# Patient Record
Sex: Male | Born: 1950
Health system: Southern US, Community
[De-identification: ages and names within clinical notes are randomized; demographics above are authoritative.]

## PROBLEM LIST (undated history)

## (undated) DIAGNOSIS — I82403 Acute embolism and thrombosis of unspecified deep veins of lower extremity, bilateral: Secondary | ICD-10-CM

## (undated) DIAGNOSIS — G8929 Other chronic pain: Secondary | ICD-10-CM

## (undated) DIAGNOSIS — N1831 Chronic kidney disease, stage 3a: Secondary | ICD-10-CM

## (undated) DIAGNOSIS — R778 Other specified abnormalities of plasma proteins: Secondary | ICD-10-CM

## (undated) DIAGNOSIS — D62 Acute posthemorrhagic anemia: Secondary | ICD-10-CM

## (undated) DIAGNOSIS — R7309 Other abnormal glucose: Secondary | ICD-10-CM

## (undated) DIAGNOSIS — Z79899 Other long term (current) drug therapy: Secondary | ICD-10-CM

## (undated) DIAGNOSIS — Z5181 Encounter for therapeutic drug level monitoring: Secondary | ICD-10-CM

## (undated) DIAGNOSIS — I255 Ischemic cardiomyopathy: Secondary | ICD-10-CM

## (undated) DIAGNOSIS — I639 Cerebral infarction, unspecified: Secondary | ICD-10-CM

## (undated) DIAGNOSIS — I509 Heart failure, unspecified: Secondary | ICD-10-CM

## (undated) DIAGNOSIS — I4891 Unspecified atrial fibrillation: Secondary | ICD-10-CM

## (undated) DIAGNOSIS — J9601 Acute respiratory failure with hypoxia: Secondary | ICD-10-CM

## (undated) DIAGNOSIS — I69391 Dysphagia following cerebral infarction: Secondary | ICD-10-CM

## (undated) DIAGNOSIS — I5043 Acute on chronic combined systolic (congestive) and diastolic (congestive) heart failure: Secondary | ICD-10-CM

## (undated) DIAGNOSIS — N179 Acute kidney failure, unspecified: Secondary | ICD-10-CM

## (undated) DIAGNOSIS — I480 Paroxysmal atrial fibrillation: Secondary | ICD-10-CM

## (undated) DIAGNOSIS — I420 Dilated cardiomyopathy: Secondary | ICD-10-CM

## (undated) DIAGNOSIS — I10 Essential (primary) hypertension: Secondary | ICD-10-CM

## (undated) DIAGNOSIS — B1920 Unspecified viral hepatitis C without hepatic coma: Secondary | ICD-10-CM

## (undated) DIAGNOSIS — R7989 Other specified abnormal findings of blood chemistry: Secondary | ICD-10-CM

## (undated) HISTORY — DX: Acute on chronic combined systolic (congestive) and diastolic (congestive) heart failure: I50.43

## (undated) HISTORY — DX: Ischemic cardiomyopathy: I25.5

## (undated) HISTORY — DX: Cerebral infarction, unspecified: I63.9

## (undated) HISTORY — DX: Other chronic pain: G89.29

## (undated) HISTORY — DX: Acute kidney failure, unspecified: N17.9

## (undated) HISTORY — DX: Acute embolism and thrombosis of unspecified deep veins of lower extremity, bilateral: I82.403

## (undated) HISTORY — DX: Paroxysmal atrial fibrillation: I48.0

## (undated) HISTORY — DX: Other abnormal glucose: R73.09

## (undated) HISTORY — DX: Unspecified atrial fibrillation: I48.91

## (undated) HISTORY — DX: Other specified abnormal findings of blood chemistry: R79.89

## (undated) HISTORY — DX: Dilated cardiomyopathy: I42.0

## (undated) HISTORY — DX: Acute respiratory failure with hypoxia: J96.01

## (undated) HISTORY — DX: Chronic kidney disease, stage 3a: N18.31

## (undated) HISTORY — DX: Acute posthemorrhagic anemia: D62

## (undated) HISTORY — DX: Other specified abnormalities of plasma proteins: R77.8

## (undated) HISTORY — DX: Dysphagia following cerebral infarction: I69.391

## (undated) NOTE — *Deleted (*Deleted)
Initial Nutrition Assessment  DOCUMENTATION CODES:      INTERVENTION:  ***   NUTRITION DIAGNOSIS:     related to   as evidenced by  .  ***  GOAL:      ***  MONITOR:      REASON FOR ASSESSMENT:        ASSESSMENT:      ***   NUTRITION - FOCUSED PHYSICAL EXAM:  {RD Focused Exam List:21252}  Diet Order:   Diet Order            Diet heart healthy/carb modified Room service appropriate? Yes; Fluid consistency: Thin; Fluid restriction: 1800 mL Fluid  Diet effective now                 EDUCATION NEEDS:      Skin:     Last BM:     Height:   Ht Readings from Last 1 Encounters:  12/26/19 5\' 7"  (1.702 m)    Weight:   Wt Readings from Last 1 Encounters:  12/30/19 77.6 kg    Ideal Body Weight:     BMI:  Body mass index is 26.79 kg/m.  Estimated Nutritional Needs:   Kcal:     Protein:     Fluid:       ***

---

## 1988-02-20 HISTORY — PX: EYE SURGERY: SHX253

## 2013-02-19 HISTORY — PX: CARDIAC DEFIBRILLATOR PLACEMENT: SHX171

## 2016-08-14 ENCOUNTER — Ambulatory Visit (INDEPENDENT_AMBULATORY_CARE_PROVIDER_SITE_OTHER): Payer: Medicaid Other | Admitting: Family Medicine

## 2016-08-14 ENCOUNTER — Encounter: Payer: Self-pay | Admitting: Family Medicine

## 2016-08-14 DIAGNOSIS — I10 Essential (primary) hypertension: Secondary | ICD-10-CM

## 2016-08-14 DIAGNOSIS — B1920 Unspecified viral hepatitis C without hepatic coma: Secondary | ICD-10-CM | POA: Insufficient documentation

## 2016-08-14 DIAGNOSIS — B182 Chronic viral hepatitis C: Secondary | ICD-10-CM

## 2016-08-14 DIAGNOSIS — I509 Heart failure, unspecified: Secondary | ICD-10-CM | POA: Insufficient documentation

## 2016-08-14 HISTORY — DX: Essential (primary) hypertension: I10

## 2016-08-14 MED ORDER — FUROSEMIDE 20 MG PO TABS: 20.0000 mg | ORAL_TABLET | Freq: Every day | ORAL | 3 refills | Status: DC

## 2016-08-14 MED ORDER — CARVEDILOL 25 MG PO TABS: 37.5000 mg | ORAL_TABLET | Freq: Two times a day (BID) | ORAL | 1 refills | Status: DC

## 2016-08-14 NOTE — Assessment & Plan Note (Signed)
Unclear of status Requesting records from last PCP Consider labs and referral to GI/ID at f/u in 1 month

## 2016-08-14 NOTE — Progress Notes (Signed)
   Subjective:   Glenn [name] is a 66 y.o. male with a history of CHF, HTN, hepatitis C here for establishing care  SOB - woke him at 3am this morning - now resolved - had something similar when he had afib previously - denies CP - took double dose of Lasix this AM as was instructed by previous cardiologist for when he was SOB - Never told that he has COPD, but taking Advair, but only as needed  Was being treated for heart failure by Cardiologist in [place] - thinks Echo was done around 02/2016 - doesn't know his EF - Cardiologist: Gerre Couch MD  Hep C - thinks that he started treatment in [place] but did not see GI  On disability for neck and shoulder pain  Review of Systems:  Per HPI.   Social History: current smoker - pre-contemplative  Objective:  BP 124/82   Pulse 68   Temp 98.2 F (36.8 C) (Oral)   Wt 183 lb (83 kg)   SpO2 98%   Gen:  66 y.o. male in NAD HEENT: NCAT, MMM, anicteric sclerae Neck: Supple, no LAD CV: RRR, no MRG Resp: Non-labored, slight crackles in b/l bases Abd: Soft, NTND, BS present, no guarding or organomegaly Ext: WWP, 1+ edema b/l MSK: No obvious deformities, gait intact Neuro: Alert and oriented, speech normal      Assessment & Plan:     Glenn [name] is a 66 y.o. male here for   HTN (hypertension) Well controlled today Continue current meds Would draw BMP at next visit in 1 month Will request records from former cardiologist and PCP  CHF (congestive heart failure) ([place]) Unclear of last EF, but does have AICD in place so suspect quite reduced EF Requesting records from previous Cardiologist Continue current meds - is taking ASA, loop diuretic, ACEi, B blocker, and Hydral/Imdur Referral to Cardiology  Is slightly hypervolemic currently, so will increase LAsix to 40mg  daily x4 days and then resume 20mg  daily Check BMP at next visit Return precautions discussed  Hepatitis C Unclear of status Requesting records from last  PCP Consider labs and referral to GI/ID at f/u in 1 month   Glenn [name], Dionne Bucy, MD MPH PGY-3,  [facility] 08/14/2016  2:38 PM

## 2016-08-14 NOTE — Patient Instructions (Signed)
Referral was placed for cardiology.  Someone will call about this in 1-2 weeks.  Let us know if you do not hear back about this.  Make a follow-up appointment with our clinic in 2-4 weeks.  We will request records from your doctors in [place].  Take Lasix 40mg  daily (2 pills) for the next 3 days for the extra fluid and then go back to 1 pill daily.  Take care, Dr. Jacinto Reap

## 2016-08-14 NOTE — Assessment & Plan Note (Signed)
Well controlled today Continue current meds Would draw BMP at next visit in 1 month Will request records from former cardiologist and PCP

## 2016-08-14 NOTE — Assessment & Plan Note (Addendum)
Unclear of last EF, but does have AICD in place so suspect quite reduced EF Requesting records from previous Cardiologist Continue current meds - is taking ASA, loop diuretic, ACEi, B blocker, and Hydral/Imdur Referral to Cardiology  Is slightly hypervolemic currently, so will increase LAsix to 40mg  daily x4 days and then resume 20mg  daily Check BMP at next visit Return precautions discussed

## 2016-09-05 ENCOUNTER — Emergency Department (HOSPITAL_COMMUNITY): Payer: Medicaid Other | Admitting: Certified Registered"

## 2016-09-05 ENCOUNTER — Inpatient Hospital Stay (HOSPITAL_COMMUNITY): Payer: Medicaid Other

## 2016-09-05 ENCOUNTER — Emergency Department (HOSPITAL_COMMUNITY): Payer: Medicaid Other

## 2016-09-05 ENCOUNTER — Encounter (HOSPITAL_COMMUNITY)
Admission: EM | Disposition: A | Discharge status: Home / Self Care | Payer: Self-pay | Source: Home / Self Care | Attending: Neurology

## 2016-09-05 ENCOUNTER — Encounter (HOSPITAL_COMMUNITY): Payer: Self-pay | Admitting: Emergency Medicine

## 2016-09-05 ENCOUNTER — Inpatient Hospital Stay (HOSPITAL_COMMUNITY)
Admission: EM | Admit: 2016-09-05 | Discharge: 2016-09-08 | DRG: 023 | Disposition: A | Discharge status: Home / Self Care | Payer: Medicaid Other | Attending: Neurology | Admitting: Neurology

## 2016-09-05 DIAGNOSIS — R29716 NIHSS score 16: Secondary | ICD-10-CM | POA: Diagnosis present

## 2016-09-05 DIAGNOSIS — R4701 Aphasia: Secondary | ICD-10-CM | POA: Diagnosis present

## 2016-09-05 DIAGNOSIS — F17200 Nicotine dependence, unspecified, uncomplicated: Secondary | ICD-10-CM | POA: Diagnosis not present

## 2016-09-05 DIAGNOSIS — R531 Weakness: Secondary | ICD-10-CM | POA: Diagnosis present

## 2016-09-05 DIAGNOSIS — I255 Ischemic cardiomyopathy: Secondary | ICD-10-CM | POA: Diagnosis present

## 2016-09-05 DIAGNOSIS — T4275XA Adverse effect of unspecified antiepileptic and sedative-hypnotic drugs, initial encounter: Secondary | ICD-10-CM | POA: Diagnosis present

## 2016-09-05 DIAGNOSIS — I63412 Cerebral infarction due to embolism of left middle cerebral artery: Secondary | ICD-10-CM | POA: Diagnosis not present

## 2016-09-05 DIAGNOSIS — Z7901 Long term (current) use of anticoagulants: Secondary | ICD-10-CM

## 2016-09-05 DIAGNOSIS — I110 Hypertensive heart disease with heart failure: Secondary | ICD-10-CM | POA: Diagnosis present

## 2016-09-05 DIAGNOSIS — I63312 Cerebral infarction due to thrombosis of left middle cerebral artery: Secondary | ICD-10-CM | POA: Diagnosis not present

## 2016-09-05 DIAGNOSIS — G9340 Encephalopathy, unspecified: Secondary | ICD-10-CM | POA: Diagnosis present

## 2016-09-05 DIAGNOSIS — G8191 Hemiplegia, unspecified affecting right dominant side: Secondary | ICD-10-CM | POA: Diagnosis present

## 2016-09-05 DIAGNOSIS — Z79899 Other long term (current) drug therapy: Secondary | ICD-10-CM

## 2016-09-05 DIAGNOSIS — D649 Anemia, unspecified: Secondary | ICD-10-CM | POA: Diagnosis not present

## 2016-09-05 DIAGNOSIS — I10 Essential (primary) hypertension: Secondary | ICD-10-CM | POA: Diagnosis not present

## 2016-09-05 DIAGNOSIS — I482 Chronic atrial fibrillation: Secondary | ICD-10-CM | POA: Diagnosis not present

## 2016-09-05 DIAGNOSIS — N179 Acute kidney failure, unspecified: Secondary | ICD-10-CM | POA: Diagnosis present

## 2016-09-05 DIAGNOSIS — I481 Persistent atrial fibrillation: Secondary | ICD-10-CM | POA: Diagnosis present

## 2016-09-05 DIAGNOSIS — I5043 Acute on chronic combined systolic (congestive) and diastolic (congestive) heart failure: Secondary | ICD-10-CM | POA: Diagnosis present

## 2016-09-05 DIAGNOSIS — F17210 Nicotine dependence, cigarettes, uncomplicated: Secondary | ICD-10-CM | POA: Diagnosis present

## 2016-09-05 DIAGNOSIS — I639 Cerebral infarction, unspecified: Secondary | ICD-10-CM

## 2016-09-05 DIAGNOSIS — I63512 Cerebral infarction due to unspecified occlusion or stenosis of left middle cerebral artery: Secondary | ICD-10-CM | POA: Diagnosis present

## 2016-09-05 DIAGNOSIS — Q251 Coarctation of aorta: Secondary | ICD-10-CM

## 2016-09-05 DIAGNOSIS — I4819 Other persistent atrial fibrillation: Secondary | ICD-10-CM

## 2016-09-05 DIAGNOSIS — Z95810 Presence of automatic (implantable) cardiac defibrillator: Secondary | ICD-10-CM | POA: Diagnosis not present

## 2016-09-05 DIAGNOSIS — J9601 Acute respiratory failure with hypoxia: Secondary | ICD-10-CM | POA: Diagnosis present

## 2016-09-05 DIAGNOSIS — I509 Heart failure, unspecified: Secondary | ICD-10-CM | POA: Diagnosis not present

## 2016-09-05 DIAGNOSIS — Z7982 Long term (current) use of aspirin: Secondary | ICD-10-CM

## 2016-09-05 DIAGNOSIS — B1920 Unspecified viral hepatitis C without hepatic coma: Secondary | ICD-10-CM | POA: Diagnosis present

## 2016-09-05 DIAGNOSIS — I360 Nonrheumatic tricuspid (valve) stenosis: Secondary | ICD-10-CM | POA: Diagnosis not present

## 2016-09-05 DIAGNOSIS — R402412 Glasgow coma scale score 13-15, at arrival to emergency department: Secondary | ICD-10-CM | POA: Diagnosis present

## 2016-09-05 DIAGNOSIS — R911 Solitary pulmonary nodule: Secondary | ICD-10-CM | POA: Diagnosis present

## 2016-09-05 DIAGNOSIS — I4891 Unspecified atrial fibrillation: Secondary | ICD-10-CM | POA: Diagnosis not present

## 2016-09-05 DIAGNOSIS — I5022 Chronic systolic (congestive) heart failure: Secondary | ICD-10-CM | POA: Diagnosis not present

## 2016-09-05 DIAGNOSIS — G92 Toxic encephalopathy: Secondary | ICD-10-CM | POA: Diagnosis present

## 2016-09-05 DIAGNOSIS — I6300 Cerebral infarction due to thrombosis of unspecified precerebral artery: Secondary | ICD-10-CM

## 2016-09-05 HISTORY — DX: Heart failure, unspecified: I50.9

## 2016-09-05 HISTORY — DX: Unspecified viral hepatitis C without hepatic coma: B19.20

## 2016-09-05 HISTORY — DX: Essential (primary) hypertension: I10

## 2016-09-05 HISTORY — DX: Cerebral infarction, unspecified: I63.9

## 2016-09-05 HISTORY — PX: RADIOLOGY WITH ANESTHESIA: SHX6223

## 2016-09-05 HISTORY — PX: IR PERCUTANEOUS ART THROMBECTOMY/INFUSION INTRACRANIAL INC DIAG ANGIO: IMG6087

## 2016-09-05 LAB — COMPREHENSIVE METABOLIC PANEL
ALBUMIN: 4 g/dL (ref 3.5–5.0)
ALK PHOS: 72 U/L (ref 38–126)
ALT: 33 U/L (ref 17–63)
ANION GAP: 8 (ref 5–15)
AST: 43 U/L — ABNORMAL HIGH (ref 15–41)
BUN: 15 mg/dL (ref 6–20)
CO2: 23 mmol/L (ref 22–32)
Calcium: 9.1 mg/dL (ref 8.9–10.3)
Chloride: 104 mmol/L (ref 101–111)
Creatinine, Ser: 1.31 mg/dL — ABNORMAL HIGH (ref 0.61–1.24)
GFR calc non Af Amer: >60 mL/min (ref >60)
GLUCOSE: 134 mg/dL — AB (ref 65–99)
POTASSIUM: 4.1 mmol/L (ref 3.5–5.1)
SODIUM: 135 mmol/L (ref 135–145)
TOTAL PROTEIN: 7.1 g/dL (ref 6.5–8.1)
Total Bilirubin: 0.9 mg/dL (ref 0.3–1.2)

## 2016-09-05 LAB — I-STAT CHEM 8, ED
BUN: 19 mg/dL (ref 6–20)
CALCIUM ION: 1.07 mmol/L — AB (ref 1.15–1.40)
Chloride: 103 mmol/L (ref 101–111)
Creatinine, Ser: 1.3 mg/dL — ABNORMAL HIGH (ref 0.61–1.24)
Glucose, Bld: 139 mg/dL — ABNORMAL HIGH (ref 65–99)
HEMATOCRIT: 38 % — AB (ref 39.0–52.0)
HEMOGLOBIN: 12.9 g/dL — AB (ref 13.0–17.0)
Potassium: 4.4 mmol/L (ref 3.5–5.1)
SODIUM: 139 mmol/L (ref 135–145)
TCO2: 26 mmol/L (ref 0–100)

## 2016-09-05 LAB — CBC
HCT: 34.3 % — ABNORMAL LOW (ref 39.0–52.0)
Hemoglobin: 11.7 g/dL — ABNORMAL LOW (ref 13.0–17.0)
MCH: 32.8 pg (ref 26.0–34.0)
MCHC: 34.1 g/dL (ref 30.0–36.0)
MCV: 96.1 fL (ref 78.0–100.0)
PLATELETS: 188 10*3/uL (ref 150–400)
RBC: 3.57 MIL/uL — AB (ref 4.22–5.81)
RDW: 12.4 % (ref 11.5–15.5)
WBC: 5 10*3/uL (ref 4.0–10.5)

## 2016-09-05 LAB — PROTIME-INR
INR: 1.3
PROTHROMBIN TIME: 16.3 s — AB (ref 11.4–15.2)

## 2016-09-05 LAB — DIFFERENTIAL
BASOS PCT: 0 %
Basophils Absolute: 0 10*3/uL (ref 0.0–0.1)
EOS ABS: 0.8 10*3/uL — AB (ref 0.0–0.7)
Eosinophils Relative: 17 %
Lymphocytes Relative: 42 %
Lymphs Abs: 2.1 10*3/uL (ref 0.7–4.0)
MONO ABS: 0.5 10*3/uL (ref 0.1–1.0)
Monocytes Relative: 10 %
NEUTROS PCT: 31 %
Neutro Abs: 1.5 10*3/uL — ABNORMAL LOW (ref 1.7–7.7)

## 2016-09-05 LAB — I-STAT TROPONIN, ED: Troponin i, poc: 0.01 ng/mL (ref 0.00–0.08)

## 2016-09-05 LAB — APTT: aPTT: 33 seconds (ref 24–36)

## 2016-09-05 LAB — ETHANOL: ALCOHOL ETHYL (B): 8 mg/dL — AB (ref <5)

## 2016-09-05 SURGERY — RADIOLOGY WITH ANESTHESIA
Anesthesia: General

## 2016-09-05 MED ORDER — EPTIFIBATIDE 20 MG/10ML IV SOLN
INTRAVENOUS | Status: AC
  Filled 2016-09-05: qty 10

## 2016-09-05 MED ORDER — ACETAMINOPHEN 325 MG PO TABS: 650.0000 mg | ORAL_TABLET | ORAL | Status: DC | PRN

## 2016-09-05 MED ORDER — BUSPIRONE HCL 15 MG PO TABS: 7.5000 mg | ORAL_TABLET | Freq: Every day | ORAL | Status: DC

## 2016-09-05 MED ORDER — PHENYLEPHRINE HCL 10 MG/ML IJ SOLN
INTRAVENOUS | Status: DC | PRN
  Administered 2016-09-05: 30 ug/min via INTRAVENOUS

## 2016-09-05 MED ORDER — ACETAMINOPHEN 650 MG RE SUPP
650.0000 mg | RECTAL | Status: DC | PRN
  Administered 2016-09-06: 650 mg via RECTAL
  Filled 2016-09-05: qty 1

## 2016-09-05 MED ORDER — SUCCINYLCHOLINE CHLORIDE 20 MG/ML IJ SOLN
INTRAMUSCULAR | Status: DC | PRN
  Administered 2016-09-05: 140 mg via INTRAVENOUS

## 2016-09-05 MED ORDER — PROPOFOL 500 MG/50ML IV EMUL
INTRAVENOUS | Status: DC | PRN
  Administered 2016-09-05: 75 ug/kg/min via INTRAVENOUS

## 2016-09-05 MED ORDER — IOPAMIDOL (ISOVUE-300) INJECTION 61%
INTRAVENOUS | Status: AC
Start: 2016-09-05 — End: 2016-09-05
  Administered 2016-09-05: 96 mL
  Filled 2016-09-05: qty 300

## 2016-09-05 MED ORDER — PANTOPRAZOLE SODIUM 40 MG PO TBEC
40.0000 mg | DELAYED_RELEASE_TABLET | Freq: Every day | ORAL | Status: DC
  Administered 2016-09-07 – 2016-09-08 (×2): 40 mg via ORAL
  Filled 2016-09-05 (×2): qty 1

## 2016-09-05 MED ORDER — IOPAMIDOL (ISOVUE-370) INJECTION 76%
100.0000 mL | Freq: Once | INTRAVENOUS | Status: AC | PRN
  Administered 2016-09-05: 100 mL via INTRAVENOUS

## 2016-09-05 MED ORDER — ROCURONIUM BROMIDE 100 MG/10ML IV SOLN
INTRAVENOUS | Status: DC | PRN
  Administered 2016-09-05 (×2): 50 mg via INTRAVENOUS

## 2016-09-05 MED ORDER — SODIUM CHLORIDE 0.9 % IV SOLN
INTRAVENOUS | Status: DC | PRN
  Administered 2016-09-05 (×2): via INTRAVENOUS

## 2016-09-05 MED ORDER — HYDRALAZINE HCL 50 MG PO TABS: 25.0000 mg | ORAL_TABLET | Freq: Two times a day (BID) | ORAL | Status: DC

## 2016-09-05 MED ORDER — ACETAMINOPHEN 160 MG/5ML PO SOLN: 650.0000 mg | ORAL | Status: DC | PRN

## 2016-09-05 MED ORDER — CARVEDILOL 12.5 MG PO TABS: 37.5000 mg | ORAL_TABLET | Freq: Two times a day (BID) | ORAL | Status: DC

## 2016-09-05 MED ORDER — SENNOSIDES-DOCUSATE SODIUM 8.6-50 MG PO TABS
1.0000 | ORAL_TABLET | Freq: Every evening | ORAL | Status: DC | PRN
  Administered 2016-09-07: 1 via ORAL
  Filled 2016-09-05: qty 1

## 2016-09-05 MED ORDER — VITAMIN B-1 100 MG PO TABS
100.0000 mg | ORAL_TABLET | Freq: Every day | ORAL | Status: DC
  Administered 2016-09-06 – 2016-09-08 (×3): 100 mg via ORAL
  Filled 2016-09-05 (×3): qty 1

## 2016-09-05 MED ORDER — CEFAZOLIN SODIUM-DEXTROSE 2-3 GM-% IV SOLR
INTRAVENOUS | Status: DC | PRN
  Administered 2016-09-05: 2 g via INTRAVENOUS

## 2016-09-05 MED ORDER — NITROGLYCERIN 1 MG/10 ML FOR IR/CATH LAB
INTRA_ARTERIAL | Status: AC
  Administered 2016-09-05: 25 ug
  Administered 2016-09-05: 50 ug
  Administered 2016-09-05: 25 ug
  Filled 2016-09-05: qty 10

## 2016-09-05 MED ORDER — SODIUM CHLORIDE 0.9 % IV SOLN
INTRAVENOUS | Status: DC
  Administered 2016-09-06: 01:00:00 via INTRAVENOUS

## 2016-09-05 MED ORDER — PROPOFOL 10 MG/ML IV BOLUS
INTRAVENOUS | Status: DC | PRN
  Administered 2016-09-05: 150 mg via INTRAVENOUS

## 2016-09-05 MED ORDER — EPHEDRINE SULFATE 50 MG/ML IJ SOLN
INTRAMUSCULAR | Status: DC | PRN
  Administered 2016-09-05 (×2): 10 mg via INTRAVENOUS

## 2016-09-05 MED ORDER — CEFAZOLIN SODIUM-DEXTROSE 2-4 GM/100ML-% IV SOLN
INTRAVENOUS | Status: AC
  Filled 2016-09-05: qty 100

## 2016-09-05 MED ORDER — STROKE: EARLY STAGES OF RECOVERY BOOK
Freq: Once | Status: AC
  Filled 2016-09-05: qty 1

## 2016-09-05 MED ORDER — MOMETASONE FURO-FORMOTEROL FUM 200-5 MCG/ACT IN AERO: 2.0000 | INHALATION_SPRAY | Freq: Two times a day (BID) | RESPIRATORY_TRACT | Status: DC

## 2016-09-05 MED ORDER — PHENYLEPHRINE HCL 10 MG/ML IJ SOLN
INTRAMUSCULAR | Status: DC | PRN
  Administered 2016-09-05: 40 ug via INTRAVENOUS
  Administered 2016-09-05 (×3): 80 ug via INTRAVENOUS

## 2016-09-05 MED ORDER — LISINOPRIL 10 MG PO TABS: 5.0000 mg | ORAL_TABLET | Freq: Every day | ORAL | Status: DC

## 2016-09-05 MED ORDER — FUROSEMIDE 20 MG PO TABS: 20.0000 mg | ORAL_TABLET | Freq: Every day | ORAL | Status: DC

## 2016-09-05 MED ORDER — VITAMIN D 1000 UNITS PO TABS
1000.0000 [IU] | ORAL_TABLET | Freq: Every day | ORAL | Status: DC
Start: 2016-09-06 — End: 2016-09-08
  Administered 2016-09-06 – 2016-09-08 (×3): 1000 [IU] via ORAL
  Filled 2016-09-05 (×3): qty 1

## 2016-09-05 NOTE — Consult Note (Signed)
PULMONARY / CRITICAL CARE MEDICINE   Name: Glenn [name] MRN: [id number] DOB: 03/10/1960    ADMISSION DATE:  09/05/2016 CONSULTATION DATE:  09/05/16  REFERRING MD:  Cheral Marker  CHIEF COMPLAINT:  AMS  HISTORY OF PRESENT ILLNESS:  Pt is encephelopathic; therefore, this HPI is obtained from chart review. Glenn [name] is a 66 y.o. male with PMH as outlined below. He presented to [facility] ED 7/18 as code stroke.  On arrival to ED, he was mute with right hemiparesis and right facial droop.  CT demonstrated hyperdense left M2 segment and CTA demonstrated left MCA occlusion.  He was subsequently taken to IR where he had left common carotid arteriogram followed by complete revascularization of occluded left MCA.  He then returned to the ICU on the ventilator and PCCM was asked to assist with vent management.  PAST MEDICAL HISTORY :  He  has a past medical history of CHF (congestive heart failure) ([place]); Hepatitis C; and Hypertension.  PAST SURGICAL HISTORY: He  has a past surgical history that includes Eye surgery (Left, 1990) and Cardiac defibrillator placement (2015).  No Known Allergies  No current facility-administered medications on file prior to encounter.    Current Outpatient Prescriptions on File Prior to Encounter  Medication Sig  . aspirin EC 81 MG tablet Take 81 mg by mouth daily.  . busPIRone (BUSPAR) 7.5 MG tablet Take 7.5 mg by mouth daily.  . carvedilol (COREG) 25 MG tablet Take 1.5 tablets (37.5 mg total) by mouth 2 (two) times daily with a meal.  . Cholecalciferol (VITAMIN D3) 1000 units CAPS Take 1,000 Units by mouth daily.  . Fluticasone-Salmeterol (ADVAIR) 250-50 MCG/DOSE AEPB Inhale 1 puff into the lungs 2 (two) times daily.  . furosemide (LASIX) 20 MG tablet Take 1 tablet (20 mg total) by mouth daily.  . hydrALAZINE (APRESOLINE) 25 MG tablet Take 25 mg by mouth 2 (two) times daily.  . isosorbide mononitrate (ISMO,MONOKET) 10 MG tablet Take 10 mg by mouth daily.  Marland Kitchen  lisinopril (PRINIVIL,ZESTRIL) 5 MG tablet Take 5 mg by mouth daily.  Marland Kitchen omeprazole (PRILOSEC) 20 MG capsule Take 20 mg by mouth daily.  . rivaroxaban (XARELTO) 20 MG TABS tablet Take 20 mg by mouth daily with supper.  . thiamine (VITAMIN B-1) 100 MG tablet Take 100 mg by mouth daily.    FAMILY HISTORY:  His indicated that his mother is deceased.    SOCIAL HISTORY: He  reports that he has been smoking Cigarettes.  He has been smoking about 0.50 packs per day. He has never used smokeless tobacco. He reports that he drinks about 1.8 oz of alcohol per week . He reports that he uses drugs, including Marijuana, about 2 times per week.  REVIEW OF SYSTEMS:   Unable to obtain as pt is encephalopathic.  SUBJECTIVE:  On vent, unresponsive.  VITAL SIGNS: BP 110/67   Pulse (!) 107   Temp 97.6 F (36.4 C) (Axillary)   Resp 18   SpO2 100%   HEMODYNAMICS:    VENTILATOR SETTINGS:    INTAKE / OUTPUT: No intake/output data recorded.   PHYSICAL EXAMINATION: General: Adult male, in NAD. Neuro: Sedated, non-responsive.  HEENT: [place]/AT. PERRL, sclerae anicteric. Cardiovascular: IRIR, no M/R/G.  Lungs: Respirations even and unlabored.  CTA bilaterally, No W/R/R. Abdomen: BS x 4, soft, NT/ND.  Musculoskeletal: No gross deformities, no edema.  Skin: Intact, warm, no rashes.  LABS:  BMET  Recent Labs Lab 09/05/16 2020 09/05/16 2028  NA 135 139  K 4.1  4.4  CL 104 103  CO2 23  --   BUN 15 19  CREATININE 1.31* 1.30*  GLUCOSE 134* 139*    Electrolytes  Recent Labs Lab 09/05/16 2020  CALCIUM 9.1    CBC  Recent Labs Lab 09/05/16 2020 09/05/16 2028  WBC 5.0  --   HGB 11.7* 12.9*  HCT 34.3* 38.0*  PLT 188  --     Coag's  Recent Labs Lab 09/05/16 2020  APTT 33  INR 1.30    Sepsis Markers No results for input(s): LATICACIDVEN, PROCALCITON, O2SATVEN in the last 168 hours.  ABG No results for input(s): PHART, PCO2ART, PO2ART in the last 168 hours.  Liver  Enzymes  Recent Labs Lab 09/05/16 2020  AST 43*  ALT 33  ALKPHOS 72  BILITOT 0.9  ALBUMIN 4.0    Cardiac Enzymes No results for input(s): TROPONINI, PROBNP in the last 168 hours.  Glucose No results for input(s): GLUCAP in the last 168 hours.  Imaging Ct Angio Head W Or Wo Contrast  Result Date: 09/05/2016 EXAM: CT ANGIOGRAPHY HEAD AND NECK CT PERFUSION BRAIN TECHNIQUE: Multidetector CT imaging of the head and neck was performed using the standard protocol during bolus administration of intravenous contrast. Multiplanar CT image reconstructions and MIPs were obtained to evaluate the vascular anatomy. Carotid stenosis measurements (when applicable) are obtained utilizing NASCET criteria, using the distal internal carotid diameter as the denominator. Multiphase CT imaging of the brain was performed following IV bolus contrast injection. Subsequent parametric perfusion maps were calculated using RAPID software. COMPARISON:  None. FINDINGS: CTA NECK FINDINGS Aortic arch: Bovine arch. Imaged portion shows no evidence of aneurysm or dissection. No significant stenosis of the major arch vessel origins. Minimal calcific atherosclerosis of the aortic arch. Right carotid system: No evidence of dissection, stenosis (50% or greater) or occlusion. Moderate calcified plaque of the carotid bifurcation with minimal less than 30% proximal ICA stenosis. Left carotid system: No evidence of dissection, stenosis (50% or greater) or occlusion. Mild calcified plaque of the bifurcation. Vertebral arteries: Codominant. No evidence of dissection, stenosis (50% or greater) or occlusion. Skeleton: Moderate cervical degenerative changes with upper cervical facet arthrosis greater on the right and discogenic degenerative disease greatest at the C4 through C7 levels. No high-grade bony canal stenosis. Other neck: Negative. Upper chest: 5 mm nodule in the right upper lobe (series 8, image 347). Review of the MIP images  confirms the above findings CTA HEAD FINDINGS Anterior circulation: Left MCA superior division origin occlusion with intermediate collateralization. The point of occlusion is in the left anterior sylvian fissure corresponding to the hyperdensity on prior noncontrast CT of the head (series 10, image 147) and at the branching origin of the inferior division which makes an approximately 90 degrees angle downward turn relative to the M1 (series 13, image 31). Otherwise the anterior circulation is patent without high-grade stenosis, aneurysm, or occlusion. Calcified plaque of the carotid siphons with mild right-sided distal cavernous stenosis. Posterior circulation: No significant stenosis, proximal occlusion, aneurysm, or vascular malformation. Venous sinuses: As permitted by contrast timing, patent. Anatomic variants: Right larger than left posterior communicating artery is and diminutive patent anterior communicating artery. Review of the MIP images confirms the above findings CT Brain Perfusion Findings: CBF (<30%) Volume: 13mL Perfusion (Tmax>6.0s) volume: 174mL Mismatch Volume: 137mL Infarction Location:Perfusion anomaly is centered in the left lateral frontal and parietal lobes. IMPRESSION: CT perfusion: No infarct core (CBF less than 30%) by automated RAPID processing. At risk ischemia in the left  superior MCA distribution with volume 108 cc. CTA head: 1. Left MCA superior division origin occlusion with intermediate collateralization. 2. Otherwise patent circle of Willis without significant stenosis or aneurysm identified. CTA neck: 1. Patent carotid and vertebral arteries. No dissection, aneurysm, or hemodynamically significant stenosis is identified. 2. 5 mm right upper lobe pulmonary nodule. No follow-up needed if patient is low-risk. Non-contrast chest CT can be considered in 12 months if patient is high-risk. This recommendation follows the consensus statement: Guidelines for Management of Incidental  Pulmonary Nodules Detected on CT Images: From the Fleischner Society 2017; Radiology 2017; 284:228-243. These results were called by telephone at the time of interpretation on 09/05/2016 at 9:09 pm to Dr. Cheral Marker, who verbally acknowledged these results. Electronically Signed   By: Kristine Garbe M.D.   On: 09/05/2016 21:26   Ct Angio Neck W And/or Wo Contrast  Result Date: 09/05/2016 EXAM: CT ANGIOGRAPHY HEAD AND NECK CT PERFUSION BRAIN TECHNIQUE: Multidetector CT imaging of the head and neck was performed using the standard protocol during bolus administration of intravenous contrast. Multiplanar CT image reconstructions and MIPs were obtained to evaluate the vascular anatomy. Carotid stenosis measurements (when applicable) are obtained utilizing NASCET criteria, using the distal internal carotid diameter as the denominator. Multiphase CT imaging of the brain was performed following IV bolus contrast injection. Subsequent parametric perfusion maps were calculated using RAPID software. COMPARISON:  None. FINDINGS: CTA NECK FINDINGS Aortic arch: Bovine arch. Imaged portion shows no evidence of aneurysm or dissection. No significant stenosis of the major arch vessel origins. Minimal calcific atherosclerosis of the aortic arch. Right carotid system: No evidence of dissection, stenosis (50% or greater) or occlusion. Moderate calcified plaque of the carotid bifurcation with minimal less than 30% proximal ICA stenosis. Left carotid system: No evidence of dissection, stenosis (50% or greater) or occlusion. Mild calcified plaque of the bifurcation. Vertebral arteries: Codominant. No evidence of dissection, stenosis (50% or greater) or occlusion. Skeleton: Moderate cervical degenerative changes with upper cervical facet arthrosis greater on the right and discogenic degenerative disease greatest at the C4 through C7 levels. No high-grade bony canal stenosis. Other neck: Negative. Upper chest: 5 mm nodule in the  right upper lobe (series 8, image 347). Review of the MIP images confirms the above findings CTA HEAD FINDINGS Anterior circulation: Left MCA superior division origin occlusion with intermediate collateralization. The point of occlusion is in the left anterior sylvian fissure corresponding to the hyperdensity on prior noncontrast CT of the head (series 10, image 147) and at the branching origin of the inferior division which makes an approximately 90 degrees angle downward turn relative to the M1 (series 13, image 31). Otherwise the anterior circulation is patent without high-grade stenosis, aneurysm, or occlusion. Calcified plaque of the carotid siphons with mild right-sided distal cavernous stenosis. Posterior circulation: No significant stenosis, proximal occlusion, aneurysm, or vascular malformation. Venous sinuses: As permitted by contrast timing, patent. Anatomic variants: Right larger than left posterior communicating artery is and diminutive patent anterior communicating artery. Review of the MIP images confirms the above findings CT Brain Perfusion Findings: CBF (<30%) Volume: 93mL Perfusion (Tmax>6.0s) volume: 167mL Mismatch Volume: 149mL Infarction Location:Perfusion anomaly is centered in the left lateral frontal and parietal lobes. IMPRESSION: CT perfusion: No infarct core (CBF less than 30%) by automated RAPID processing. At risk ischemia in the left superior MCA distribution with volume 108 cc. CTA head: 1. Left MCA superior division origin occlusion with intermediate collateralization. 2. Otherwise patent circle of Willis without significant stenosis  or aneurysm identified. CTA neck: 1. Patent carotid and vertebral arteries. No dissection, aneurysm, or hemodynamically significant stenosis is identified. 2. 5 mm right upper lobe pulmonary nodule. No follow-up needed if patient is low-risk. Non-contrast chest CT can be considered in 12 months if patient is high-risk. This recommendation follows the  consensus statement: Guidelines for Management of Incidental Pulmonary Nodules Detected on CT Images: From the Fleischner Society 2017; Radiology 2017; 284:228-243. These results were called by telephone at the time of interpretation on 09/05/2016 at 9:09 pm to Dr. Cheral Marker, who verbally acknowledged these results. Electronically Signed   By: Kristine Garbe M.D.   On: 09/05/2016 21:26   Ct Cerebral Perfusion W Contrast  Result Date: 09/05/2016 EXAM: CT ANGIOGRAPHY HEAD AND NECK CT PERFUSION BRAIN TECHNIQUE: Multidetector CT imaging of the head and neck was performed using the standard protocol during bolus administration of intravenous contrast. Multiplanar CT image reconstructions and MIPs were obtained to evaluate the vascular anatomy. Carotid stenosis measurements (when applicable) are obtained utilizing NASCET criteria, using the distal internal carotid diameter as the denominator. Multiphase CT imaging of the brain was performed following IV bolus contrast injection. Subsequent parametric perfusion maps were calculated using RAPID software. COMPARISON:  None. FINDINGS: CTA NECK FINDINGS Aortic arch: Bovine arch. Imaged portion shows no evidence of aneurysm or dissection. No significant stenosis of the major arch vessel origins. Minimal calcific atherosclerosis of the aortic arch. Right carotid system: No evidence of dissection, stenosis (50% or greater) or occlusion. Moderate calcified plaque of the carotid bifurcation with minimal less than 30% proximal ICA stenosis. Left carotid system: No evidence of dissection, stenosis (50% or greater) or occlusion. Mild calcified plaque of the bifurcation. Vertebral arteries: Codominant. No evidence of dissection, stenosis (50% or greater) or occlusion. Skeleton: Moderate cervical degenerative changes with upper cervical facet arthrosis greater on the right and discogenic degenerative disease greatest at the C4 through C7 levels. No high-grade bony canal  stenosis. Other neck: Negative. Upper chest: 5 mm nodule in the right upper lobe (series 8, image 347). Review of the MIP images confirms the above findings CTA HEAD FINDINGS Anterior circulation: Left MCA superior division origin occlusion with intermediate collateralization. The point of occlusion is in the left anterior sylvian fissure corresponding to the hyperdensity on prior noncontrast CT of the head (series 10, image 147) and at the branching origin of the inferior division which makes an approximately 90 degrees angle downward turn relative to the M1 (series 13, image 31). Otherwise the anterior circulation is patent without high-grade stenosis, aneurysm, or occlusion. Calcified plaque of the carotid siphons with mild right-sided distal cavernous stenosis. Posterior circulation: No significant stenosis, proximal occlusion, aneurysm, or vascular malformation. Venous sinuses: As permitted by contrast timing, patent. Anatomic variants: Right larger than left posterior communicating artery is and diminutive patent anterior communicating artery. Review of the MIP images confirms the above findings CT Brain Perfusion Findings: CBF (<30%) Volume: 55mL Perfusion (Tmax>6.0s) volume: 146mL Mismatch Volume: 163mL Infarction Location:Perfusion anomaly is centered in the left lateral frontal and parietal lobes. IMPRESSION: CT perfusion: No infarct core (CBF less than 30%) by automated RAPID processing. At risk ischemia in the left superior MCA distribution with volume 108 cc. CTA head: 1. Left MCA superior division origin occlusion with intermediate collateralization. 2. Otherwise patent circle of Willis without significant stenosis or aneurysm identified. CTA neck: 1. Patent carotid and vertebral arteries. No dissection, aneurysm, or hemodynamically significant stenosis is identified. 2. 5 mm right upper lobe pulmonary nodule. No follow-up  needed if patient is low-risk. Non-contrast chest CT can be considered in 12  months if patient is high-risk. This recommendation follows the consensus statement: Guidelines for Management of Incidental Pulmonary Nodules Detected on CT Images: From the Fleischner Society 2017; Radiology 2017; 284:228-243. These results were called by telephone at the time of interpretation on 09/05/2016 at 9:09 pm to Dr. Cheral Marker, who verbally acknowledged these results. Electronically Signed   By: Kristine Garbe M.D.   On: 09/05/2016 21:26   Ct Head Code Stroke W/o Cm  Result Date: 09/05/2016 CLINICAL DATA:  Code stroke.  Aphasia EXAM: CT HEAD WITHOUT CONTRAST TECHNIQUE: Contiguous axial images were obtained from the base of the skull through the vertex without intravenous contrast. COMPARISON:  None. FINDINGS: Brain: Generalized atrophy. Negative for hydrocephalus. Negative for acute infarct. No intracranial hemorrhage or mass. Vascular: Short segment hyperdensity left M2 segment compatible with acute thrombus. No other hyperdense vessel. Atherosclerotic disease in the cavernous carotid bilaterally. Skull: Negative Sinuses/Orbits: Negative Other: None ASPECTS ([place] Stroke Program Early CT Score) - Ganglionic level infarction (caudate, lentiform nuclei, internal capsule, insula, M1-M3 cortex): 7 - Supraganglionic infarction (M4-M6 cortex): 3 Total score (0-10 with 10 being normal): 10 IMPRESSION: 1. Negative for acute infarct or hemorrhage. Hyperdense left M2 segment compatible with acute thrombosis. 2. ASPECTS is 10 These results were called by telephone at the time of interpretation on 09/05/2016 at 8:40 pm to Dr. Cheral Marker, who verbally acknowledged these results. Electronically Signed   By: Franchot Gallo M.D.   On: 09/05/2016 20:41     STUDIES:  CT head 7/18 > hyperdense left M2 segment. CTA head 7/18 > let MCA occlusion.  CULTURES: None.  ANTIBIOTICS: None.  SIGNIFICANT EVENTS: 7/18 > admit.  LINES/TUBES: ETT 7/18 >   DISCUSSION: 66 y.o. male admitted 7/18 with left MCA  occlusion.  Taken to IR for revascularization then returned to ICU on vent.  ASSESSMENT / PLAN:  NEUROLOGIC A:   Acute encephalopathy - due to sedation. Left MCA occlusion - s/p IR revascularization 7/18. P:   Sedation:  Propofol gtt / Fentanyl PRN. RASS goal: 0 to -1. Daily WUA. Neurology following. Stroke workup / management per neuro. Hold preadmission buspirone.  PULMONARY A: Respiratory insufficiency - due to inability to extubate post IR procedure. P:   Full vent support. Wean as able. VAP prevention measures. SBT in AM with hopeful extubation. Budesonide / Brovana in lieu of preadmission Advair (unclear why he takes, no documentation of lung disease). CXR in AM.  CARDIOVASCULAR A:  A.fib with RVR - amio ordered. Hx CHF (no echo on file), A.fib (on xarelto) P:  Monitor hemodynamics. Increase sedation - if no improvement then can start amio. Hold preadmission carvedilol, furosemide, hydralazine, lisinopril, rivaroxaban.  RENAL A:   AKI - unknown baseline. Hypocalcemia. P:   NS @ 50. 1g Ca gluconate. BMP in AM.  GASTROINTESTINAL A:   GI prophylaxis. Nutrition. Hx HCV. P:   SUP: Pantoprazole. NPO.  HEMATOLOGIC A:   VTE Prophylaxis. P:  SCD's. CBC in AM.  INFECTIOUS A:   No indication of infection. P:   Monitor clinically.  ENDOCRINE A:   No acute issues.   P:   No interventions required.    Family updated: None available.  Interdisciplinary Family Meeting v Palliative Care Meeting:  Due by: 09/12/16.  CC time: 30 min.   Montey Hora, [facility] Pager: [phone]  or [phone] 09/06/2016, 12:38  AM

## 2016-09-05 NOTE — ED Notes (Signed)
Awaiting response from [place] regarding whether pt is candidate for IR. Pedial pulses marked.

## 2016-09-05 NOTE — ED Notes (Signed)
Reported off to IR team

## 2016-09-05 NOTE — H&P (Addendum)
Admission H&P    Chief Complaint: Acute stroke  HPI: Glenn [name] is an 66 y.o. male who presents to the ED as a Code Stroke. He was last seen normal at 7:30 PM, then became suddenly mute with right upper extremity weakness and right facial droop. Deficits were noted by his son, who immediately called 911. At his baseline he is alert, oriented and fully independent.   On arrival to the ED he remained mute with right hemiparesis and prominent right facial droop, in addition to leftward gaze deviation and relative neglect of his right side.   CT head showed no hemorrhage or acute hypodensity. Hyperdense left M2 segment compatible with acute thrombosis was noted.   CTA of head revealed a left MCA superior division origin occlusion with intermediate collateralization.  CTA neck revealed patent carotid and vertebral arteries. No dissection, aneurysm, or hemodynamically significant stenosis is identified.  CT perfusion study showed no infarct core (CBF less than 30%) by automated RAPID processing. At risk ischemia in the left superior MCA distribution with volume 108 cc was noted.   LSN: 7:30 PM tPA Given: No: On oral anticoagulation NIHSS: 15   Past Medical History:  Diagnosis Date  . CHF (congestive heart failure) ([place])   . Hepatitis C   . Hypertension     Past Surgical History:  Procedure Laterality Date  . CARDIAC DEFIBRILLATOR PLACEMENT  2015  . EYE SURGERY Left 1990    History reviewed. No pertinent family history. Social History:  reports that he has been smoking Cigarettes.  He has been smoking about 0.50 packs per day. He has never used smokeless tobacco. He reports that he drinks about 1.8 oz of alcohol per week . He reports that he uses drugs, including Marijuana, about 2 times per week.  Allergies: No Known Allergies  Home medications: ASA 81 mg qd Buspirone Carvedilol Advair Lasix Hydralazine Isosorbide  mononitrate Lisinopril Omeprazole Rivaroxaban Thiamine Vitamin D3  ROS: Unable to obtain due to aphasia.   Physical Examination: Blood pressure 122/85, pulse 93, temperature 97.6 F (36.4 C), temperature source Axillary, resp. rate 18, SpO2 97 %.  HEENT-  [place]/AT  Lungs - Respirations unlabored  Abdomen - Nondistended Extremities - Mild pitting edema to distal lower extremities  Neurologic Examination: Mental Status: Awake with right hemineglect. Mute without any verbal output. Able to follow about 20% of simple motor commands, many requiring visual demonstration to patient.  Cranial Nerves: II:  Right visual field cut. PERRL.   III,IV, VI: Ptosis not present. Left gaze deviation. Able to briefly cross midline to right with difficulty.  V,VII: Prominent right facial droop. Responds to tactile stimulation bilaterally.  VIII: Hearing intact to voice IX,X: Nonverbal. Does not open mouth wide enough for adequate visualization of uvula/palate.  XI: Head rotated to left.  XII: Does not protrude tongue to command Motor: RUE with decreased tone. Unable to raise at shoulder against gravity. Able to flex at elbow against gravity. RLE: 3/5 HF, 4/5 knee extension LUE and LLE 5/5 Sensory: Reacts to noxious stimuli x 4. Nods head yes to cool temperature stimulus in all 4 extremities. Decreased attention to right sided tactile stimuli.  Deep Tendon Reflexes:  1+ brachioradialis and biceps bilaterally.  1+ patellae, 0 achilles bilaterally.  Right toe upgoing, left toe downgoing.  Cerebellar: No ataxia with FNF on left. Unable to perform on right.   Gait: Deferred due to acuity of presentation.    Results for orders placed or performed during the hospital encounter of  09/05/16 (from the past 48 hour(s))  Ethanol     Status: Abnormal   Collection Time: 09/05/16  8:20 PM  Result Value Ref Range   Alcohol, Ethyl (B) 8 (H) <5 mg/dL    Comment:        LOWEST DETECTABLE LIMIT FOR SERUM ALCOHOL  IS 5 mg/dL FOR MEDICAL PURPOSES ONLY   Protime-INR     Status: Abnormal   Collection Time: 09/05/16  8:20 PM  Result Value Ref Range   Prothrombin Time 16.3 (H) 11.4 - 15.2 seconds   INR 1.30   APTT     Status: None   Collection Time: 09/05/16  8:20 PM  Result Value Ref Range   aPTT 33 24 - 36 seconds  CBC     Status: Abnormal   Collection Time: 09/05/16  8:20 PM  Result Value Ref Range   WBC 5.0 4.0 - 10.5 K/uL   RBC 3.57 (L) 4.22 - 5.81 MIL/uL   Hemoglobin 11.7 (L) 13.0 - 17.0 g/dL   HCT 34.3 (L) 39.0 - 52.0 %   MCV 96.1 78.0 - 100.0 fL   MCH 32.8 26.0 - 34.0 pg   MCHC 34.1 30.0 - 36.0 g/dL   RDW 12.4 11.5 - 15.5 %   Platelets 188 150 - 400 K/uL  Differential     Status: Abnormal   Collection Time: 09/05/16  8:20 PM  Result Value Ref Range   Neutrophils Relative % 31 %   Neutro Abs 1.5 (L) 1.7 - 7.7 K/uL   Lymphocytes Relative 42 %   Lymphs Abs 2.1 0.7 - 4.0 K/uL   Monocytes Relative 10 %   Monocytes Absolute 0.5 0.1 - 1.0 K/uL   Eosinophils Relative 17 %   Eosinophils Absolute 0.8 (H) 0.0 - 0.7 K/uL   Basophils Relative 0 %   Basophils Absolute 0.0 0.0 - 0.1 K/uL  Comprehensive metabolic panel     Status: Abnormal   Collection Time: 09/05/16  8:20 PM  Result Value Ref Range   Sodium 135 135 - 145 mmol/L   Potassium 4.1 3.5 - 5.1 mmol/L   Chloride 104 101 - 111 mmol/L   CO2 23 22 - 32 mmol/L   Glucose, Bld 134 (H) 65 - 99 mg/dL   BUN 15 6 - 20 mg/dL   Creatinine, Ser 1.31 (H) 0.61 - 1.24 mg/dL   Calcium 9.1 8.9 - 10.3 mg/dL   Total Protein 7.1 6.5 - 8.1 g/dL   Albumin 4.0 3.5 - 5.0 g/dL   AST 43 (H) 15 - 41 U/L   ALT 33 17 - 63 U/L   Alkaline Phosphatase 72 38 - 126 U/L   Total Bilirubin 0.9 0.3 - 1.2 mg/dL   GFR calc non Af Amer >60 >60 mL/min   GFR calc Af Amer >60 >60 mL/min    Comment: (NOTE) The eGFR has been calculated using the CKD EPI equation. This calculation has not been validated in all clinical situations. eGFR's persistently <60 mL/min  signify possible Chronic Kidney Disease.    Anion gap 8 5 - 15  I-stat troponin, ED (not at [facility], [facility])     Status: None   Collection Time: 09/05/16  8:27 PM  Result Value Ref Range   Troponin i, poc 0.01 0.00 - 0.08 ng/mL   Comment 3            Comment: Due to the release kinetics of cTnI, a negative result within the first hours of the onset of symptoms  does not rule out myocardial infarction with certainty. If myocardial infarction is still suspected, repeat the test at appropriate intervals.   I-Stat Chem 8, ED  (not at [facility], [facility])     Status: Abnormal   Collection Time: 09/05/16  8:28 PM  Result Value Ref Range   Sodium 139 135 - 145 mmol/L   Potassium 4.4 3.5 - 5.1 mmol/L   Chloride 103 101 - 111 mmol/L   BUN 19 6 - 20 mg/dL   Creatinine, Ser 1.30 (H) 0.61 - 1.24 mg/dL   Glucose, Bld 139 (H) 65 - 99 mg/dL   Calcium, Ion 1.07 (L) 1.15 - 1.40 mmol/L   TCO2 26 0 - 100 mmol/L   Hemoglobin 12.9 (L) 13.0 - 17.0 g/dL   HCT 38.0 (L) 39.0 - 52.0 %   Ct Angio Head W Or Wo Contrast  Result Date: 09/05/2016 EXAM: CT ANGIOGRAPHY HEAD AND NECK CT PERFUSION BRAIN TECHNIQUE: Multidetector CT imaging of the head and neck was performed using the standard protocol during bolus administration of intravenous contrast. Multiplanar CT image reconstructions and MIPs were obtained to evaluate the vascular anatomy. Carotid stenosis measurements (when applicable) are obtained utilizing NASCET criteria, using the distal internal carotid diameter as the denominator. Multiphase CT imaging of the brain was performed following IV bolus contrast injection. Subsequent parametric perfusion maps were calculated using RAPID software. COMPARISON:  None. FINDINGS: CTA NECK FINDINGS Aortic arch: Bovine arch. Imaged portion shows no evidence of aneurysm or dissection. No significant stenosis of the major arch vessel origins. Minimal calcific atherosclerosis of the aortic arch. Right carotid system: No evidence of  dissection, stenosis (50% or greater) or occlusion. Moderate calcified plaque of the carotid bifurcation with minimal less than 30% proximal ICA stenosis. Left carotid system: No evidence of dissection, stenosis (50% or greater) or occlusion. Mild calcified plaque of the bifurcation. Vertebral arteries: Codominant. No evidence of dissection, stenosis (50% or greater) or occlusion. Skeleton: Moderate cervical degenerative changes with upper cervical facet arthrosis greater on the right and discogenic degenerative disease greatest at the C4 through C7 levels. No high-grade bony canal stenosis. Other neck: Negative. Upper chest: 5 mm nodule in the right upper lobe (series 8, image 347). Review of the MIP images confirms the above findings CTA HEAD FINDINGS Anterior circulation: Left MCA superior division origin occlusion with intermediate collateralization. The point of occlusion is in the left anterior sylvian fissure corresponding to the hyperdensity on prior noncontrast CT of the head (series 10, image 147) and at the branching origin of the inferior division which makes an approximately 90 degrees angle downward turn relative to the M1 (series 13, image 31). Otherwise the anterior circulation is patent without high-grade stenosis, aneurysm, or occlusion. Calcified plaque of the carotid siphons with mild right-sided distal cavernous stenosis. Posterior circulation: No significant stenosis, proximal occlusion, aneurysm, or vascular malformation. Venous sinuses: As permitted by contrast timing, patent. Anatomic variants: Right larger than left posterior communicating artery is and diminutive patent anterior communicating artery. Review of the MIP images confirms the above findings CT Brain Perfusion Findings: CBF (<30%) Volume: 61m Perfusion (Tmax>6.0s) volume: 1055mMismatch Volume: 10844mnfarction Location:Perfusion anomaly is centered in the left lateral frontal and parietal lobes. IMPRESSION: CT perfusion: No  infarct core (CBF less than 30%) by automated RAPID processing. At risk ischemia in the left superior MCA distribution with volume 108 cc. CTA head: 1. Left MCA superior division origin occlusion with intermediate collateralization. 2. Otherwise patent circle of Willis without significant stenosis or  aneurysm identified. CTA neck: 1. Patent carotid and vertebral arteries. No dissection, aneurysm, or hemodynamically significant stenosis is identified. 2. 5 mm right upper lobe pulmonary nodule. No follow-up needed if patient is low-risk. Non-contrast chest CT can be considered in 12 months if patient is high-risk. This recommendation follows the consensus statement: Guidelines for Management of Incidental Pulmonary Nodules Detected on CT Images: From the Fleischner Society 2017; Radiology 2017; 284:228-243. These results were called by telephone at the time of interpretation on 09/05/2016 at 9:09 pm to Dr. Cheral Marker, who verbally acknowledged these results. Electronically Signed   By: Kristine Garbe M.D.   On: 09/05/2016 21:26   Ct Angio Neck W And/or Wo Contrast  Result Date: 09/05/2016 EXAM: CT ANGIOGRAPHY HEAD AND NECK CT PERFUSION BRAIN TECHNIQUE: Multidetector CT imaging of the head and neck was performed using the standard protocol during bolus administration of intravenous contrast. Multiplanar CT image reconstructions and MIPs were obtained to evaluate the vascular anatomy. Carotid stenosis measurements (when applicable) are obtained utilizing NASCET criteria, using the distal internal carotid diameter as the denominator. Multiphase CT imaging of the brain was performed following IV bolus contrast injection. Subsequent parametric perfusion maps were calculated using RAPID software. COMPARISON:  None. FINDINGS: CTA NECK FINDINGS Aortic arch: Bovine arch. Imaged portion shows no evidence of aneurysm or dissection. No significant stenosis of the major arch vessel origins. Minimal calcific  atherosclerosis of the aortic arch. Right carotid system: No evidence of dissection, stenosis (50% or greater) or occlusion. Moderate calcified plaque of the carotid bifurcation with minimal less than 30% proximal ICA stenosis. Left carotid system: No evidence of dissection, stenosis (50% or greater) or occlusion. Mild calcified plaque of the bifurcation. Vertebral arteries: Codominant. No evidence of dissection, stenosis (50% or greater) or occlusion. Skeleton: Moderate cervical degenerative changes with upper cervical facet arthrosis greater on the right and discogenic degenerative disease greatest at the C4 through C7 levels. No high-grade bony canal stenosis. Other neck: Negative. Upper chest: 5 mm nodule in the right upper lobe (series 8, image 347). Review of the MIP images confirms the above findings CTA HEAD FINDINGS Anterior circulation: Left MCA superior division origin occlusion with intermediate collateralization. The point of occlusion is in the left anterior sylvian fissure corresponding to the hyperdensity on prior noncontrast CT of the head (series 10, image 147) and at the branching origin of the inferior division which makes an approximately 90 degrees angle downward turn relative to the M1 (series 13, image 31). Otherwise the anterior circulation is patent without high-grade stenosis, aneurysm, or occlusion. Calcified plaque of the carotid siphons with mild right-sided distal cavernous stenosis. Posterior circulation: No significant stenosis, proximal occlusion, aneurysm, or vascular malformation. Venous sinuses: As permitted by contrast timing, patent. Anatomic variants: Right larger than left posterior communicating artery is and diminutive patent anterior communicating artery. Review of the MIP images confirms the above findings CT Brain Perfusion Findings: CBF (<30%) Volume: 84m Perfusion (Tmax>6.0s) volume: 109mMismatch Volume: 10834mnfarction Location:Perfusion anomaly is centered in the  left lateral frontal and parietal lobes. IMPRESSION: CT perfusion: No infarct core (CBF less than 30%) by automated RAPID processing. At risk ischemia in the left superior MCA distribution with volume 108 cc. CTA head: 1. Left MCA superior division origin occlusion with intermediate collateralization. 2. Otherwise patent circle of Willis without significant stenosis or aneurysm identified. CTA neck: 1. Patent carotid and vertebral arteries. No dissection, aneurysm, or hemodynamically significant stenosis is identified. 2. 5 mm right upper lobe pulmonary nodule. No  follow-up needed if patient is low-risk. Non-contrast chest CT can be considered in 12 months if patient is high-risk. This recommendation follows the consensus statement: Guidelines for Management of Incidental Pulmonary Nodules Detected on CT Images: From the Fleischner Society 2017; Radiology 2017; 284:228-243. These results were called by telephone at the time of interpretation on 09/05/2016 at 9:09 pm to Dr. Cheral Marker, who verbally acknowledged these results. Electronically Signed   By: Kristine Garbe M.D.   On: 09/05/2016 21:26   Ct Cerebral Perfusion W Contrast  Result Date: 09/05/2016 EXAM: CT ANGIOGRAPHY HEAD AND NECK CT PERFUSION BRAIN TECHNIQUE: Multidetector CT imaging of the head and neck was performed using the standard protocol during bolus administration of intravenous contrast. Multiplanar CT image reconstructions and MIPs were obtained to evaluate the vascular anatomy. Carotid stenosis measurements (when applicable) are obtained utilizing NASCET criteria, using the distal internal carotid diameter as the denominator. Multiphase CT imaging of the brain was performed following IV bolus contrast injection. Subsequent parametric perfusion maps were calculated using RAPID software. COMPARISON:  None. FINDINGS: CTA NECK FINDINGS Aortic arch: Bovine arch. Imaged portion shows no evidence of aneurysm or dissection. No significant  stenosis of the major arch vessel origins. Minimal calcific atherosclerosis of the aortic arch. Right carotid system: No evidence of dissection, stenosis (50% or greater) or occlusion. Moderate calcified plaque of the carotid bifurcation with minimal less than 30% proximal ICA stenosis. Left carotid system: No evidence of dissection, stenosis (50% or greater) or occlusion. Mild calcified plaque of the bifurcation. Vertebral arteries: Codominant. No evidence of dissection, stenosis (50% or greater) or occlusion. Skeleton: Moderate cervical degenerative changes with upper cervical facet arthrosis greater on the right and discogenic degenerative disease greatest at the C4 through C7 levels. No high-grade bony canal stenosis. Other neck: Negative. Upper chest: 5 mm nodule in the right upper lobe (series 8, image 347). Review of the MIP images confirms the above findings CTA HEAD FINDINGS Anterior circulation: Left MCA superior division origin occlusion with intermediate collateralization. The point of occlusion is in the left anterior sylvian fissure corresponding to the hyperdensity on prior noncontrast CT of the head (series 10, image 147) and at the branching origin of the inferior division which makes an approximately 90 degrees angle downward turn relative to the M1 (series 13, image 31). Otherwise the anterior circulation is patent without high-grade stenosis, aneurysm, or occlusion. Calcified plaque of the carotid siphons with mild right-sided distal cavernous stenosis. Posterior circulation: No significant stenosis, proximal occlusion, aneurysm, or vascular malformation. Venous sinuses: As permitted by contrast timing, patent. Anatomic variants: Right larger than left posterior communicating artery is and diminutive patent anterior communicating artery. Review of the MIP images confirms the above findings CT Brain Perfusion Findings: CBF (<30%) Volume: 42m Perfusion (Tmax>6.0s) volume: 1060mMismatch Volume:  10822mnfarction Location:Perfusion anomaly is centered in the left lateral frontal and parietal lobes. IMPRESSION: CT perfusion: No infarct core (CBF less than 30%) by automated RAPID processing. At risk ischemia in the left superior MCA distribution with volume 108 cc. CTA head: 1. Left MCA superior division origin occlusion with intermediate collateralization. 2. Otherwise patent circle of Willis without significant stenosis or aneurysm identified. CTA neck: 1. Patent carotid and vertebral arteries. No dissection, aneurysm, or hemodynamically significant stenosis is identified. 2. 5 mm right upper lobe pulmonary nodule. No follow-up needed if patient is low-risk. Non-contrast chest CT can be considered in 12 months if patient is high-risk. This recommendation follows the consensus statement: Guidelines for Management of Incidental  Pulmonary Nodules Detected on CT Images: From the Fleischner Society 2017; Radiology 2017; [phone]. These results were called by telephone at the time of interpretation on 09/05/2016 at 9:09 pm to Dr. Cheral Marker, who verbally acknowledged these results. Electronically Signed   By: Kristine Garbe M.D.   On: 09/05/2016 21:26   Ct Head Code Stroke W/o Cm  Result Date: 09/05/2016 CLINICAL DATA:  Code stroke.  Aphasia EXAM: CT HEAD WITHOUT CONTRAST TECHNIQUE: Contiguous axial images were obtained from the base of the skull through the vertex without intravenous contrast. COMPARISON:  None. FINDINGS: Brain: Generalized atrophy. Negative for hydrocephalus. Negative for acute infarct. No intracranial hemorrhage or mass. Vascular: Short segment hyperdensity left M2 segment compatible with acute thrombus. No other hyperdense vessel. Atherosclerotic disease in the cavernous carotid bilaterally. Skull: Negative Sinuses/Orbits: Negative Other: None ASPECTS ([place] Stroke Program Early CT Score) - Ganglionic level infarction (caudate, lentiform nuclei, internal capsule, insula, M1-M3  cortex): 7 - Supraganglionic infarction (M4-M6 cortex): 3 Total score (0-10 with 10 being normal): 10 IMPRESSION: 1. Negative for acute infarct or hemorrhage. Hyperdense left M2 segment compatible with acute thrombosis. 2. ASPECTS is 10 These results were called by telephone at the time of interpretation on 09/05/2016 at 8:40 pm to Dr. Cheral Marker, who verbally acknowledged these results. Electronically Signed   By: Franchot Gallo M.D.   On: 09/05/2016 20:41    Assessment: 66 y.o. male with acute left MCA territory ischemia secondary to superior left M2 division occlusion. No definite infarct core seen on CT perfusion. At high risk for progression to large left MCA territory ischemic infarction.  1. Stroke Risk Factors - CHF, HTN and smoking 2. Has implanted cardiac defibrillator. May not be able to obtain an MRI if this device is not compatible.  3. Atrial fibrillation, on Xarelto  Plan: 1. The patient is an endovascular candidate. Discussed with Dr. Estanislado Pandy who has called in the [place] team. Discussed risks/benefits with the patient's son who stated that he wishes to have everything done to prevent progression to completed stroke.  2. Hold off on follow up MRI brain until compatibility of his implanted cardiac defibrillator can be determined.  3. Admit to ICU following endovascular procedure. Orders to include frequent neuro checks and BP management 4. Echocardiogram 5. HgbA1c, fasting lipid panel 6. Hold off on anticoagulation for at least 24 hours. If repeat CT head at 24 hours post-procedure is negative for hemorrhage, can restart anticoagulation, but would consider an anticoagulant from an alternate class given that he has failed therapy with Xarelto.  7. Risk factor modification 8. Telemetry monitoring 9. Will need follow up non-contrast chest CT in 12 months given smoking history and 5 mm right upper lobe pulmonary nodule seen on CTA. if patient is high-risk.  10. PT consult, OT consult, Speech  consult  60 minutes spent in the acute Neurological evaluation and management of this critically ill acute stroke patient.  Electronically signed: Dr. Kerney Elbe 09/05/2016, 10:33 PM

## 2016-09-05 NOTE — Anesthesia Procedure Notes (Signed)
Procedure Name: Intubation Date/Time: 09/05/2016 9:53 PM Performed by: Manuela Schwartz B Pre-anesthesia Checklist: Patient identified, Emergency Drugs available, Suction available and Patient being monitored Patient Re-evaluated:Patient Re-evaluated prior to induction Oxygen Delivery Method: Circle System Utilized Preoxygenation: Pre-oxygenation with 100% oxygen Induction Type: IV induction Ventilation: Mask ventilation without difficulty Laryngoscope Size: Mac and 3 Grade View: Grade II Tube type: Subglottic suction tube Tube size: 8.0 mm Number of attempts: 1 Airway Equipment and Method: Stylet and Oral airway Placement Confirmation: ETT inserted through vocal cords under direct vision,  positive ETCO2 and breath sounds checked- equal and bilateral Secured at: 23 cm Tube secured with: Tape Dental Injury: Teeth and Oropharynx as per pre-operative assessment

## 2016-09-05 NOTE — ED Triage Notes (Signed)
Pt arrives as a code stroke, LSW 1930, R sided weakness, Left sided facial droop. Mute. Usually alert and oriented, independent with ADLs.

## 2016-09-05 NOTE — ED Notes (Signed)
Returned to treatment room from CT at this time, son at bedside

## 2016-09-05 NOTE — Progress Notes (Signed)
Responded to code stroke at 2017. Pt presents with acute onset of aphasia and R sided weakness.  LSW at [place], has hx of Afib and takes Xarelto, not candidate for TPA.  CT head, CTA and CTP performed.  Initial NIHSS 15.  Pt taken to IR at 2120.

## 2016-09-05 NOTE — Procedures (Signed)
S/P Lt common carotid arteriogram,followed by complete revascularization of occluded Lt MCA superior division with x 1 pass with solitaire 33mm x 40 mm retriever  device  achieving a TICI 3 reperfusion.

## 2016-09-05 NOTE — Progress Notes (Signed)
Patient ID: Glenn [name], male   DOB: 03/10/1960, 66 y.o.   MRN: [id number] Acute onset global aphasia and Rt hemiparesis  At 7 30 pm. mRss 0. NIH 16. CT no ICH ASPECTS 10. CTP CBF < 30 % 32ml.Tmax > 6.0s  126ml mismatch 123ml mismatch ratio infinite. CTA occlusion sup division of Lt MCA.Marland Kitchen Option of endovascular revasc of occluded  Sup division of Lt MCA discussed with patients godson., to prevent further neuro injury. Risks if ICH 10 to 15 % ,worsening neuro injury with further  neuro decline ,death and inability to revascularize all reviewed in detail.Questions answered. Informed witness consent for revascularization under [place] obtained.Marland Kitchen S.Lynasia Meloche MD

## 2016-09-05 NOTE — ED Notes (Signed)
Arrived in IR, escorted by this RN and RR RN

## 2016-09-05 NOTE — Anesthesia Procedure Notes (Signed)
Arterial Line Insertion Start/End7/18/2018 10:00 PM, 09/05/2016 10:04 PM Performed by: Babs Bertin, CRNA  Patient location: OR. Preanesthetic checklist: patient identified, IV checked, site marked, risks and benefits discussed, surgical consent, monitors and equipment checked, pre-op evaluation, timeout performed and anesthesia consent Lidocaine 1% used for infiltration radial was placed Catheter size: 20 G Hand hygiene performed  and maximum sterile barriers used   Attempts: 1 Procedure performed without using ultrasound guided technique. Ultrasound Notes:anatomy identified Following insertion, dressing applied and Biopatch. Additional procedure comments: Line placed after induction.

## 2016-09-05 NOTE — Anesthesia Preprocedure Evaluation (Addendum)
Anesthesia Evaluation  Patient identified by MRN, date of birth, ID band Patient awake  Preop documentation limited or incomplete due to emergent nature of procedure.  Airway Mallampati: II  TM Distance: >3 FB     Dental  (+) Teeth Intact   Pulmonary Current Smoker,    breath sounds clear to auscultation       Cardiovascular hypertension, +CHF  + Cardiac Defibrillator  Rhythm:Regular Rate:Normal  Unknown EF with CHF and AICD   Neuro/Psych    GI/Hepatic (+) Hepatitis -, C  Endo/Other    Renal/GU      Musculoskeletal   Abdominal   Peds  Hematology   Anesthesia Other Findings   Reproductive/Obstetrics                           Anesthesia Physical Anesthesia Plan  ASA: IV  Anesthesia Plan: General   Post-op Pain Management:    Induction: Intravenous, Rapid sequence and Cricoid pressure planned  PONV Risk Score and Plan: 1 and Treatment may vary due to age or medical condition and Propofol  Airway Management Planned: Oral ETT  Additional Equipment:   Intra-op Plan:   Post-operative Plan: Post-operative intubation/ventilation  Informed Consent: I have reviewed the patients History and Physical, chart, labs and discussed the procedure including the risks, benefits and alternatives for the proposed anesthesia with the patient or authorized representative who has indicated his/her understanding and acceptance.     Plan Discussed with: CRNA, Anesthesiologist and Surgeon  Anesthesia Plan Comments:      Anesthesia Quick Evaluation

## 2016-09-06 ENCOUNTER — Inpatient Hospital Stay (HOSPITAL_COMMUNITY): Payer: Medicaid Other

## 2016-09-06 ENCOUNTER — Encounter (HOSPITAL_COMMUNITY): Payer: Self-pay | Admitting: Interventional Radiology

## 2016-09-06 DIAGNOSIS — I360 Nonrheumatic tricuspid (valve) stenosis: Secondary | ICD-10-CM

## 2016-09-06 DIAGNOSIS — I482 Chronic atrial fibrillation: Secondary | ICD-10-CM

## 2016-09-06 DIAGNOSIS — I63412 Cerebral infarction due to embolism of left middle cerebral artery: Secondary | ICD-10-CM

## 2016-09-06 DIAGNOSIS — F17200 Nicotine dependence, unspecified, uncomplicated: Secondary | ICD-10-CM

## 2016-09-06 DIAGNOSIS — I10 Essential (primary) hypertension: Secondary | ICD-10-CM

## 2016-09-06 DIAGNOSIS — I5022 Chronic systolic (congestive) heart failure: Secondary | ICD-10-CM

## 2016-09-06 DIAGNOSIS — I509 Heart failure, unspecified: Secondary | ICD-10-CM

## 2016-09-06 DIAGNOSIS — I639 Cerebral infarction, unspecified: Secondary | ICD-10-CM

## 2016-09-06 DIAGNOSIS — J9601 Acute respiratory failure with hypoxia: Secondary | ICD-10-CM

## 2016-09-06 LAB — LIPID PANEL
CHOL/HDL RATIO: 3.7 ratio
CHOLESTEROL: 96 mg/dL (ref 0–200)
HDL: 26 mg/dL — AB (ref >40)
LDL Cholesterol: 44 mg/dL (ref 0–99)
TRIGLYCERIDES: 128 mg/dL (ref <150)
VLDL: 26 mg/dL (ref 0–40)

## 2016-09-06 LAB — POCT I-STAT 3, ART BLOOD GAS (G3+)
Acid-base deficit: 4 mmol/L — ABNORMAL HIGH (ref 0.0–2.0)
Bicarbonate: 23.9 mmol/L (ref 20.0–28.0)
O2 SAT: 100 %
PCO2 ART: 52.1 mmHg — AB (ref 32.0–48.0)
PH ART: 7.27 — AB (ref 7.350–7.450)
PO2 ART: 239 mmHg — AB (ref 83.0–108.0)
Patient temperature: 98.6
TCO2: 25 mmol/L (ref 0–100)

## 2016-09-06 LAB — CBC WITH DIFFERENTIAL/PLATELET
Basophils Absolute: 0 10*3/uL (ref 0.0–0.1)
Basophils Relative: 0 %
EOS ABS: 0.1 10*3/uL (ref 0.0–0.7)
EOS PCT: 3 %
HCT: 30.5 % — ABNORMAL LOW (ref 39.0–52.0)
Hemoglobin: 10.5 g/dL — ABNORMAL LOW (ref 13.0–17.0)
LYMPHS ABS: 1 10*3/uL (ref 0.7–4.0)
LYMPHS PCT: 23 %
MCH: 33.2 pg (ref 26.0–34.0)
MCHC: 34.4 g/dL (ref 30.0–36.0)
MCV: 96.5 fL (ref 78.0–100.0)
MONO ABS: 0.3 10*3/uL (ref 0.1–1.0)
MONOS PCT: 8 %
Neutro Abs: 2.9 10*3/uL (ref 1.7–7.7)
Neutrophils Relative %: 66 %
PLATELETS: 157 10*3/uL (ref 150–400)
RBC: 3.16 MIL/uL — AB (ref 4.22–5.81)
RDW: 12.9 % (ref 11.5–15.5)
WBC: 4.4 10*3/uL (ref 4.0–10.5)

## 2016-09-06 LAB — BASIC METABOLIC PANEL
Anion gap: 7 (ref 5–15)
BUN: 13 mg/dL (ref 6–20)
CO2: 23 mmol/L (ref 22–32)
CREATININE: 1.11 mg/dL (ref 0.61–1.24)
Calcium: 8.3 mg/dL — ABNORMAL LOW (ref 8.9–10.3)
Chloride: 107 mmol/L (ref 101–111)
GFR calc Af Amer: >60 mL/min (ref >60)
GLUCOSE: 115 mg/dL — AB (ref 65–99)
POTASSIUM: 3.9 mmol/L (ref 3.5–5.1)
SODIUM: 137 mmol/L (ref 135–145)

## 2016-09-06 LAB — MAGNESIUM: Magnesium: 1.6 mg/dL — ABNORMAL LOW (ref 1.7–2.4)

## 2016-09-06 LAB — ECHOCARDIOGRAM COMPLETE
Height: 67 in
Weight: 2927.71 oz

## 2016-09-06 LAB — PHOSPHORUS: Phosphorus: 3.6 mg/dL (ref 2.5–4.6)

## 2016-09-06 LAB — GLUCOSE, CAPILLARY: Glucose-Capillary: 135 mg/dL — ABNORMAL HIGH (ref 65–99)

## 2016-09-06 LAB — MRSA PCR SCREENING: MRSA by PCR: NEGATIVE

## 2016-09-06 MED ORDER — CLEVIDIPINE BUTYRATE 0.5 MG/ML IV EMUL
0.0000 mg/h | INTRAVENOUS | Status: DC
  Administered 2016-09-06: 3 mg/h via INTRAVENOUS
  Administered 2016-09-06: 1 mg/h via INTRAVENOUS
  Administered 2016-09-06: 4 mg/h via INTRAVENOUS
  Administered 2016-09-06: 6 mg/h via INTRAVENOUS
  Administered 2016-09-07: 7 mg/h via INTRAVENOUS
  Administered 2016-09-07 (×2): 6 mg/h via INTRAVENOUS
  Filled 2016-09-06 (×7): qty 50

## 2016-09-06 MED ORDER — FENTANYL CITRATE (PF) 100 MCG/2ML IJ SOLN
100.0000 ug | INTRAMUSCULAR | Status: AC | PRN
  Administered 2016-09-06 (×3): 100 ug via INTRAVENOUS
  Filled 2016-09-06 (×3): qty 2

## 2016-09-06 MED ORDER — SODIUM CHLORIDE 0.9 % IV SOLN
1.0000 g | Freq: Once | INTRAVENOUS | Status: AC
  Administered 2016-09-06: 1 g via INTRAVENOUS
  Filled 2016-09-06: qty 10

## 2016-09-06 MED ORDER — FENTANYL CITRATE (PF) 100 MCG/2ML IJ SOLN
100.0000 ug | INTRAMUSCULAR | Status: DC | PRN
Start: 2016-09-06 — End: 2016-09-06
  Administered 2016-09-06 (×2): 100 ug via INTRAVENOUS
  Filled 2016-09-06 (×2): qty 2

## 2016-09-06 MED ORDER — ACETAMINOPHEN 650 MG RE SUPP: 650.0000 mg | RECTAL | Status: DC | PRN

## 2016-09-06 MED ORDER — ARFORMOTEROL TARTRATE 15 MCG/2ML IN NEBU
15.0000 ug | INHALATION_SOLUTION | Freq: Two times a day (BID) | RESPIRATORY_TRACT | Status: DC
  Administered 2016-09-06 – 2016-09-08 (×6): 15 ug via RESPIRATORY_TRACT
  Filled 2016-09-06 (×6): qty 2

## 2016-09-06 MED ORDER — FENTANYL CITRATE (PF) 100 MCG/2ML IJ SOLN
12.5000 ug | INTRAMUSCULAR | Status: DC | PRN
Start: 2016-09-06 — End: 2016-09-08
  Administered 2016-09-06 – 2016-09-08 (×5): 25 ug via INTRAVENOUS
  Filled 2016-09-06 (×5): qty 2

## 2016-09-06 MED ORDER — AMIODARONE HCL IN DEXTROSE 360-4.14 MG/200ML-% IV SOLN: 60.0000 mg/h | INTRAVENOUS | Status: DC

## 2016-09-06 MED ORDER — ASPIRIN EC 325 MG PO TBEC
325.0000 mg | DELAYED_RELEASE_TABLET | Freq: Every day | ORAL | Status: DC
  Administered 2016-09-07: 325 mg via ORAL
  Filled 2016-09-06: qty 1

## 2016-09-06 MED ORDER — BUDESONIDE 0.5 MG/2ML IN SUSP
0.5000 mg | Freq: Two times a day (BID) | RESPIRATORY_TRACT | Status: DC
  Administered 2016-09-06 – 2016-09-08 (×6): 0.5 mg via RESPIRATORY_TRACT
  Filled 2016-09-06 (×6): qty 2

## 2016-09-06 MED ORDER — SODIUM CHLORIDE 0.9 % IV SOLN
INTRAVENOUS | Status: DC
  Administered 2016-09-06 – 2016-09-07 (×3): via INTRAVENOUS

## 2016-09-06 MED ORDER — ONDANSETRON HCL 4 MG/2ML IJ SOLN: 4.0000 mg | Freq: Four times a day (QID) | INTRAMUSCULAR | Status: DC | PRN

## 2016-09-06 MED ORDER — ASPIRIN 300 MG RE SUPP
300.0000 mg | Freq: Every day | RECTAL | Status: DC
  Administered 2016-09-06: 300 mg via RECTAL
  Filled 2016-09-06: qty 1

## 2016-09-06 MED ORDER — AMIODARONE HCL IN DEXTROSE 360-4.14 MG/200ML-% IV SOLN
30.0000 mg/h | INTRAVENOUS | Status: DC
Start: 2016-09-06 — End: 2016-09-06

## 2016-09-06 MED ORDER — PROPOFOL 1000 MG/100ML IV EMUL
0.0000 ug/kg/min | INTRAVENOUS | Status: DC
  Administered 2016-09-06 (×2): 45 ug/kg/min via INTRAVENOUS
  Administered 2016-09-06: 40 ug/kg/min via INTRAVENOUS
  Administered 2016-09-06 (×2): 45 ug/kg/min via INTRAVENOUS
  Filled 2016-09-06 (×5): qty 100

## 2016-09-06 MED ORDER — CHLORHEXIDINE GLUCONATE 0.12% ORAL RINSE (MEDLINE KIT)
15.0000 mL | Freq: Two times a day (BID) | OROMUCOSAL | Status: DC
  Administered 2016-09-06 (×2): 15 mL via OROMUCOSAL

## 2016-09-06 MED ORDER — ORAL CARE MOUTH RINSE
15.0000 mL | OROMUCOSAL | Status: DC
  Administered 2016-09-06 (×6): 15 mL via OROMUCOSAL

## 2016-09-06 MED ORDER — ACETAMINOPHEN 160 MG/5ML PO SOLN
650.0000 mg | ORAL | Status: DC | PRN
Start: 2016-09-06 — End: 2016-09-06

## 2016-09-06 MED ORDER — ACETAMINOPHEN 325 MG PO TABS: 650.0000 mg | ORAL_TABLET | ORAL | Status: DC | PRN

## 2016-09-06 MED ORDER — CHLORHEXIDINE GLUCONATE 0.12 % MT SOLN: 15.0000 mL | Freq: Two times a day (BID) | OROMUCOSAL | Status: DC

## 2016-09-06 MED ORDER — ORAL CARE MOUTH RINSE: 15.0000 mL | Freq: Two times a day (BID) | OROMUCOSAL | Status: DC

## 2016-09-06 MED ORDER — AMIODARONE LOAD VIA INFUSION
150.0000 mg | Freq: Once | INTRAVENOUS | Status: DC
  Filled 2016-09-06: qty 83.34

## 2016-09-06 NOTE — Progress Notes (Signed)
RT note: Patient is weaning at 10/5 50%, vitals are stable, patient is resting comfortably, RT will continue to monitor

## 2016-09-06 NOTE — Progress Notes (Signed)
Patient transported from CT3 to 4N 24 with no complications.

## 2016-09-06 NOTE — Progress Notes (Signed)
Rt 9 french femoral sheath removed at 1450.  Exoseal closure device and manual pressure used to achieve hemostasis at 1520.  Distal pulses intact.  No acute complications.  Site reviewed with Development worker, community.  Pressure dressing applied.  [place] RT-R Juliet King-Cushman RT-R

## 2016-09-06 NOTE — Progress Notes (Signed)
OT Cancellation Note  Patient Details Name: Glenn [name] MRN: [id number] DOB: 03/10/1960   Cancelled Treatment:    Reason Eval/Treat Not Completed: Patient not medically ready (Pt on bedrest)  [place], OT/L  [phone] 09/06/2016 09/06/2016, 7:06 AM

## 2016-09-06 NOTE — Progress Notes (Signed)
STROKE TEAM PROGRESS NOTE   HISTORY OF PRESENT ILLNESS (per record) Glenn [name] is an 66 y.o. male with a history of congestive heart failure, atrial fibrillation on Xarelto with implanted defibrillator, hepatitis C, and hypertension who presents to the ED as a Code Stroke. He was last seen normal at 7:30 PM, then became suddenly mute with right upper extremity weakness and right facial droop. Deficits were noted by his son, who immediately called 911. At his baseline he is alert, oriented and fully independent.  On arrival to the ED he remained mute with right hemiparesis and prominent right facial droop, in addition to leftward gaze deviation and relative neglect of his right side.  CT head showed no hemorrhage or acute hypodensity. Hyperdense left M2 segment compatible with acute thrombosis was noted.  CTA of head revealed a left MCA superior division origin occlusion with intermediate collateralization.  CTA neck revealed patent carotid and vertebral arteries. No dissection, aneurysm, or hemodynamically significant stenosis is identified.  CT perfusion study showed no infarct core (CBF less than 30%) by automated RAPID processing. At risk ischemia in the left superior MCA distribution with volume 108 cc was noted.  The patient underwent IR mechanical thrombectomy with successful revascularization of the left M2 segment.     LSN: 7:30 PM on 09/05/2016 NIHSS: 15  Patient was not administered IV t-PA secondary to being on oral anticoagulation (aspirin and Xarelto). He was admitted to the Neuro ICU for further evaluation and treatment.     SUBJECTIVE (INTERVAL HISTORY) His nurse is at the bedside.  The patient is still intubated and on light sedation. Sleepy but opens eyes to voice and follows all commands appropriately.  The patient still has right groin sheath in place.  Weaned off of amiodarone gtt this morning.   OBJECTIVE Temp:  [96.1 F (35.6 C)-97.6 F (36.4 C)] 97.6 F (36.4 C) (07/19  0845) Pulse Rate:  [40-148] 71 (07/19 1300) Cardiac Rhythm: Atrial fibrillation (07/19 0800) Resp:  [13-24] 18 (07/19 1300) BP: (103-162)/(49-139) 132/65 (07/19 1300) SpO2:  [90 %-100 %] 96 % (07/19 1300) Arterial Line BP: (121-179)/(51-72) 150/58 (07/19 1300) FiO2 (%):  [40 %-100 %] 40 % (07/19 1200) Weight:  [83 kg (182 lb 15.7 oz)] 83 kg (182 lb 15.7 oz) (07/19 0012)  CBC:  Recent Labs Lab 09/05/16 2020 09/05/16 2028 09/06/16 0500  WBC 5.0  --  4.4  NEUTROABS 1.5*  --  2.9  HGB 11.7* 12.9* 10.5*  HCT 34.3* 38.0* 30.5*  MCV 96.1  --  96.5  PLT 188  --  322    Basic Metabolic Panel:  Recent Labs Lab 09/05/16 2020 09/05/16 2028 09/06/16 0500  NA 135 139 137  K 4.1 4.4 3.9  CL 104 103 107  CO2 23  --  23  GLUCOSE 134* 139* 115*  BUN 15 19 13   CREATININE 1.31* 1.30* 1.11  CALCIUM 9.1  --  8.3*    Lipid Panel:    Component Value Date/Time   CHOL 96 09/06/2016 0500   TRIG 128 09/06/2016 0500   HDL 26 (L) 09/06/2016 0500   CHOLHDL 3.7 09/06/2016 0500   VLDL 26 09/06/2016 0500   LDLCALC 44 09/06/2016 0500   HgbA1c: No results found for: HGBA1C Urine Drug Screen: No results found for: LABOPIA, COCAINSCRNUR, LABBENZ, AMPHETMU, THCU, LABBARB  Alcohol Level     Component Value Date/Time   ETH 8 (H) 09/05/2016 2020    IMAGING I have personally reviewed the radiological images below and  agree with the radiology interpretations.  Ct Angio Head W Or Wo Contrast Ct Angio Neck W And/or Wo Contrast 09/05/2016 IMPRESSION: CTA head: 1. Left MCA superior division origin occlusion with intermediate collateralization. 2. Otherwise patent circle of Willis without significant stenosis or aneurysm identified.  CTA neck: 1. Patent carotid and vertebral arteries. No dissection, aneurysm, or hemodynamically significant stenosis is identified. 2. 5 mm right upper lobe pulmonary nodule.  Ct Head Wo Contrast 09/06/2016 IMPRESSION: No hemorrhage or other acute abnormality following  neurovascular intervention. Gray-white differentiation is preserved in the region of ischemia demonstrated on the earlier perfusion study.  Ct Cerebral Perfusion W Contrast 09/05/2016 IMPRESSION: CT perfusion: No infarct core (CBF less than 30%) by automated RAPID processing. At risk ischemia in the left superior MCA distribution with volume 108 cc. CTA head: 1. Left MCA superior division origin occlusion with intermediate collateralization. 2. Otherwise patent circle of Willis without significant stenosis or aneurysm identified. CTA neck: 1. Patent carotid and vertebral arteries. No dissection, aneurysm, or hemodynamically significant stenosis is identified. 2. 5 mm right upper lobe pulmonary nodule.  Ct Head Code Stroke W/o Cm 09/05/2016 IMPRESSION: 1. Negative for acute infarct or hemorrhage. Hyperdense left M2 segment compatible with acute thrombosis. 2. ASPECTS is 10   DSA - complete revascularization of occluded Lt MCA superior division with x 1 pass with solitaire 43mm x 40 mm retriever  device  achieving a TICI 3 reperfusion.  TTE pending   PHYSICAL EXAM  Temp:  [96.1 F (35.6 C)-99.3 F (37.4 C)] 99.3 F (37.4 C) (07/19 1531) Pulse Rate:  [40-148] 90 (07/19 1530) Resp:  [13-24] 17 (07/19 1530) BP: (103-162)/(49-139) 135/64 (07/19 1530) SpO2:  [90 %-100 %] 95 % (07/19 1530) Arterial Line BP: (121-179)/(51-72) 167/64 (07/19 1522) FiO2 (%):  [40 %-100 %] 40 % (07/19 1410) Weight:  [182 lb 15.7 oz (83 kg)] 182 lb 15.7 oz (83 kg) (07/19 0012)  General - Well nourished, well developed, intubated on sedation but arousable.  Ophthalmologic - Fundi not visualized.  Cardiovascular - irregularly irregular heart rate and rhythm.  Neuro - intubated on sedation but arousable to voice, following simple commands. Eyes move to both directions, with right gaze, there is sustained nystagmus. PERRL, blinking to visual threat bilaterally. Facial symmetry not able to test due to ET tube. Tongue in  midline. LUE and LLE at least 4/5 against gravity without drift. RUE drift to bed after 5 sec, and RLE proximal 3/5 and distal 4/5. DTR 1+ and no babinski. Sensation, coordination and gait not tested.   ASSESSMENT/PLAN Glenn [name] is a 66 y.o. male with history of atrial fibrillation on Xarelto with implanted defibrillator, congestive heart failure, hepatitis C, and hypertension presenting with inability to speak, with right upper extremity weakness, and right facial droop. He did not receive IV t-PA due to arriving outside of the treatment window and on Xarelto. S/p mechanical thrombectomy for left M2 occlusion.   Stroke: acute left M2 infarct s/p IR with TICI3 recannulization, stroke likely due to atrial fibrillation although on Xarelto.    Resultant  Right hemiparesis, intubated  CT head: acute L M2 hyperdense with thrombus   MRI / MRA head: not able to obtain (implanted defibrillator)  CTA head/neck: L M2 occlusion with intermediate collateralization  DSA - left M2 TICI3 recannulization  Repeat CT in am  2D Echo: pending  LDL 44  HgbA1c pending  SCDs for VTE prophylaxis  Diet NPO time specified  aspirin 81 mg daily and Xarelto (  rivaroxaban) daily prior to admission, now on ASA. If repeat CT showed no large infarct, may consider resume anticoagulation.  Ongoing aggressive stroke risk factor management  Therapy recommendations:  pending  Disposition:  Pending  afib on Xarelto  Not sure if compliant with medication  Rate controlled  If repeat CT showed no large infarct, anticoagulation can be resumed.  Hypertension  Unstable  On clevidipine  SBP 120 - 140 mmHg post IR  Long-term BP goal normotensive  Tobacco abuse  Current smoker  Smoking cessation counseling will be provided  Other Stroke Risk Factors  ETOH use, advised to drink no more than 2 drink(s) a day  CHF  Other Active Problems  Intubated   Hospital day # 1  This patient is  critically ill due to left MCA infarct with M2 occlusion s/p IR, afib, CHF and at significant risk of neurological worsening, death form recurrent stroke, hemorrhagic conversion, heart failure, cerebral edema. This patient's care requires constant monitoring of vital signs, hemodynamics, respiratory and cardiac monitoring, review of multiple databases, neurological assessment, discussion with family, other specialists and medical decision making of high complexity. I spent 45 minutes of neurocritical care time in the care of this patient.  Rosalin Hawking, MD PhD Stroke Neurology 09/06/2016 4:55 PM   To contact Stroke Continuity provider, please refer to [email or web address] After hours, contact General Neurology

## 2016-09-06 NOTE — Procedures (Signed)
Extubation Procedure Note  Patient Details:   Name: Glenn [name] DOB: 03/10/1960 MRN: [id number]   Airway Documentation:  Airway 8 mm (Active)  Secured at (cm) 24 cm 09/06/2016  7:55 PM  Measured From Lips 09/06/2016  7:55 PM  Secured Location Left 09/06/2016  7:55 PM  Secured By [employer] 09/06/2016  7:55 PM  Tube Holder Repositioned Yes 09/06/2016  7:55 PM  Cuff Pressure (cm H2O) 28 cm H2O 09/06/2016  7:39 AM  Site Condition Dry 09/06/2016  7:55 PM    Evaluation  O2 sats: stable throughout Complications: No apparent complications Patient did tolerate procedure well. Bilateral Breath Sounds: Clear   Yes  Patient extubated to 3lpm nasal cannula with Sp02=96%. Positive cuff leak before extubation. No upper airway stridor heard after extubation.  Ulice Dash 09/06/2016, 9:34 PM

## 2016-09-06 NOTE — Progress Notes (Signed)
[facility] Progress Note Patient Name: Glenn [name] DOB: 03/10/1960 MRN: [id number]   Date of Service  09/06/2016  HPI/Events of Note  afib with RVR 130's  eICU Interventions  Will start amiodarone infusion     Intervention Category Major Interventions: Arrhythmia - evaluation and management  Flora Lipps 09/06/2016, 12:34 AM

## 2016-09-06 NOTE — Transfer of Care (Signed)
Immediate Anesthesia Transfer of Care Note  Patient: Glenn [name]  Procedure(s) Performed: Procedure(s): RADIOLOGY WITH ANESTHESIA (N/A)  Patient Location: ICU  Anesthesia Type:General  Level of Consciousness: Patient remains intubated per anesthesia plan  Airway & Oxygen Therapy: Patient remains intubated per anesthesia plan and Patient placed on Ventilator (see vital sign flow sheet for setting)  Post-op Assessment: Report given to RN and Post -op Vital signs reviewed and stable  Post vital signs: Reviewed and stable  Last Vitals:  Vitals:   09/05/16 2145 09/05/16 2349  BP: (!) 148/88 110/67  Pulse: (!) 107   Resp: 18   Temp:      Last Pain:  Vitals:   09/05/16 2115  TempSrc:   PainSc: 0-No pain         Complications: No apparent anesthesia complications

## 2016-09-06 NOTE — Progress Notes (Signed)
Patient ID: Glenn [name], male   DOB: 03/10/1960, 66 y.o.   MRN: [id number]    Referring Physician(s): Dr. Kerney Elbe  Supervising Physician: Luanne Bras  Patient Status: [facility] - In-pt  Chief Complaint: CVA  Subjective: Intubated, but awakens and follows commands.    Allergies: Patient has no known allergies.  Medications: Prior to Admission medications   Medication Sig Start Date End Date Taking? Authorizing Provider  aspirin EC 81 MG tablet Take 81 mg by mouth daily.    [provider]  busPIRone (BUSPAR) 7.5 MG tablet Take 7.5 mg by mouth daily.    [provider]  carvedilol (COREG) 25 MG tablet Take 1.5 tablets (37.5 mg total) by mouth 2 (two) times daily with a meal. 08/14/16   Bacigalupo, Dionne Bucy, MD  Cholecalciferol (VITAMIN D3) 1000 units CAPS Take 1,000 Units by mouth daily.    [provider]  Fluticasone-Salmeterol (ADVAIR) 250-50 MCG/DOSE AEPB Inhale 1 puff into the lungs 2 (two) times daily.    [provider]  furosemide (LASIX) 20 MG tablet Take 1 tablet (20 mg total) by mouth daily. 08/14/16   Virginia Crews, MD  hydrALAZINE (APRESOLINE) 25 MG tablet Take 25 mg by mouth 2 (two) times daily.    [provider]  isosorbide mononitrate (ISMO,MONOKET) 10 MG tablet Take 10 mg by mouth daily.    [provider]  lisinopril (PRINIVIL,ZESTRIL) 5 MG tablet Take 5 mg by mouth daily.    [provider]  omeprazole (PRILOSEC) 20 MG capsule Take 20 mg by mouth daily.    [provider]  rivaroxaban (XARELTO) 20 MG TABS tablet Take 20 mg by mouth daily with supper.    [provider]  thiamine (VITAMIN B-1) 100 MG tablet Take 100 mg by mouth daily.    [provider]    Vital Signs: BP 123/60   Pulse 78   Temp 97.6 F (36.4 C) (Axillary)   Resp 17   Ht 5\' 7"  (1.702 m)   Wt 182 lb 15.7 oz (83 kg) Comment: From office visit notes 07/2016  SpO2 96%   BMI 28.66 kg/m    Physical Exam: Neuro: follows commands, despite sedation.  Moves all 4 extremities Lungs: intubated Skin: R CFA site with sheath still in place.  Imaging: Ct Angio Head W Or Wo Contrast  Result Date: 09/05/2016 EXAM: CT ANGIOGRAPHY HEAD AND NECK CT PERFUSION BRAIN TECHNIQUE: Multidetector CT imaging of the head and neck was performed using the standard protocol during bolus administration of intravenous contrast. Multiplanar CT image reconstructions and MIPs were obtained to evaluate the vascular anatomy. Carotid stenosis measurements (when applicable) are obtained utilizing NASCET criteria, using the distal internal carotid diameter as the denominator. Multiphase CT imaging of the brain was performed following IV bolus contrast injection. Subsequent parametric perfusion maps were calculated using RAPID software. COMPARISON:  None. FINDINGS: CTA NECK FINDINGS Aortic arch: Bovine arch. Imaged portion shows no evidence of aneurysm or dissection. No significant stenosis of the major arch vessel origins. Minimal calcific atherosclerosis of the aortic arch. Right carotid system: No evidence of dissection, stenosis (50% or greater) or occlusion. Moderate calcified plaque of the carotid bifurcation with minimal less than 30% proximal ICA stenosis. Left carotid system: No evidence of dissection, stenosis (50% or greater) or occlusion. Mild calcified plaque of the bifurcation. Vertebral arteries: Codominant. No evidence of dissection, stenosis (50% or greater) or occlusion. Skeleton: Moderate cervical degenerative changes with upper cervical facet arthrosis greater on the  right and discogenic degenerative disease greatest at the C4 through C7 levels. No high-grade bony canal stenosis. Other neck: Negative. Upper chest: 5 mm nodule in the right upper lobe (series 8, image 347). Review of the MIP images confirms the above findings CTA HEAD FINDINGS Anterior circulation: Left MCA superior division origin occlusion  with intermediate collateralization. The point of occlusion is in the left anterior sylvian fissure corresponding to the hyperdensity on prior noncontrast CT of the head (series 10, image 147) and at the branching origin of the inferior division which makes an approximately 90 degrees angle downward turn relative to the M1 (series 13, image 31). Otherwise the anterior circulation is patent without high-grade stenosis, aneurysm, or occlusion. Calcified plaque of the carotid siphons with mild right-sided distal cavernous stenosis. Posterior circulation: No significant stenosis, proximal occlusion, aneurysm, or vascular malformation. Venous sinuses: As permitted by contrast timing, patent. Anatomic variants: Right larger than left posterior communicating artery is and diminutive patent anterior communicating artery. Review of the MIP images confirms the above findings CT Brain Perfusion Findings: CBF (<30%) Volume: 46mL Perfusion (Tmax>6.0s) volume: 165mL Mismatch Volume: 182mL Infarction Location:Perfusion anomaly is centered in the left lateral frontal and parietal lobes. IMPRESSION: CT perfusion: No infarct core (CBF less than 30%) by automated RAPID processing. At risk ischemia in the left superior MCA distribution with volume 108 cc. CTA head: 1. Left MCA superior division origin occlusion with intermediate collateralization. 2. Otherwise patent circle of Willis without significant stenosis or aneurysm identified. CTA neck: 1. Patent carotid and vertebral arteries. No dissection, aneurysm, or hemodynamically significant stenosis is identified. 2. 5 mm right upper lobe pulmonary nodule. No follow-up needed if patient is low-risk. Non-contrast chest CT can be considered in 12 months if patient is high-risk. This recommendation follows the consensus statement: Guidelines for Management of Incidental Pulmonary Nodules Detected on CT Images: From the Fleischner Society 2017; Radiology 2017; 284:228-243. These results  were called by telephone at the time of interpretation on 09/05/2016 at 9:09 pm to Dr. Cheral Marker, who verbally acknowledged these results. Electronically Signed   By: Kristine Garbe M.D.   On: 09/05/2016 21:26   Ct Head Wo Contrast  Result Date: 09/06/2016 CLINICAL DATA:  Status post neurovascular intervention. EXAM: CT HEAD WITHOUT CONTRAST TECHNIQUE: Contiguous axial images were obtained from the base of the skull through the vertex without intravenous contrast. COMPARISON:  CTA head neck 09/05/2016 CTP head 09/05/2016 FINDINGS: Brain: No mass lesion, intraparenchymal hemorrhage or extra-axial collection. No evidence of acute cortical infarct. There is periventricular hypoattenuation compatible with chronic microvascular disease. Vascular: No hyperdense vessel or unexpected calcification. Skull: Normal visualized skull base, calvarium and extracranial soft tissues. Sinuses/Orbits: No sinus fluid levels or advanced mucosal thickening. No mastoid effusion. Normal orbits. IMPRESSION: No hemorrhage or other acute abnormality following neurovascular intervention. Gray-white differentiation is preserved in the region of ischemia demonstrated on the earlier perfusion study. Electronically Signed   By: Ulyses Jarred M.D.   On: 09/06/2016 00:30   Ct Angio Neck W And/or Wo Contrast  Result Date: 09/05/2016 EXAM: CT ANGIOGRAPHY HEAD AND NECK CT PERFUSION BRAIN TECHNIQUE: Multidetector CT imaging of the head and neck was performed using the standard protocol during bolus administration of intravenous contrast. Multiplanar CT image reconstructions and MIPs were obtained to evaluate the vascular anatomy. Carotid stenosis measurements (when applicable) are obtained utilizing NASCET criteria, using the distal internal carotid diameter as the denominator. Multiphase CT imaging of the brain was performed following IV bolus contrast injection. Subsequent parametric  perfusion maps were calculated using RAPID software.  COMPARISON:  None. FINDINGS: CTA NECK FINDINGS Aortic arch: Bovine arch. Imaged portion shows no evidence of aneurysm or dissection. No significant stenosis of the major arch vessel origins. Minimal calcific atherosclerosis of the aortic arch. Right carotid system: No evidence of dissection, stenosis (50% or greater) or occlusion. Moderate calcified plaque of the carotid bifurcation with minimal less than 30% proximal ICA stenosis. Left carotid system: No evidence of dissection, stenosis (50% or greater) or occlusion. Mild calcified plaque of the bifurcation. Vertebral arteries: Codominant. No evidence of dissection, stenosis (50% or greater) or occlusion. Skeleton: Moderate cervical degenerative changes with upper cervical facet arthrosis greater on the right and discogenic degenerative disease greatest at the C4 through C7 levels. No high-grade bony canal stenosis. Other neck: Negative. Upper chest: 5 mm nodule in the right upper lobe (series 8, image 347). Review of the MIP images confirms the above findings CTA HEAD FINDINGS Anterior circulation: Left MCA superior division origin occlusion with intermediate collateralization. The point of occlusion is in the left anterior sylvian fissure corresponding to the hyperdensity on prior noncontrast CT of the head (series 10, image 147) and at the branching origin of the inferior division which makes an approximately 90 degrees angle downward turn relative to the M1 (series 13, image 31). Otherwise the anterior circulation is patent without high-grade stenosis, aneurysm, or occlusion. Calcified plaque of the carotid siphons with mild right-sided distal cavernous stenosis. Posterior circulation: No significant stenosis, proximal occlusion, aneurysm, or vascular malformation. Venous sinuses: As permitted by contrast timing, patent. Anatomic variants: Right larger than left posterior communicating artery is and diminutive patent anterior communicating artery. Review of the  MIP images confirms the above findings CT Brain Perfusion Findings: CBF (<30%) Volume: 25mL Perfusion (Tmax>6.0s) volume: 170mL Mismatch Volume: 174mL Infarction Location:Perfusion anomaly is centered in the left lateral frontal and parietal lobes. IMPRESSION: CT perfusion: No infarct core (CBF less than 30%) by automated RAPID processing. At risk ischemia in the left superior MCA distribution with volume 108 cc. CTA head: 1. Left MCA superior division origin occlusion with intermediate collateralization. 2. Otherwise patent circle of Willis without significant stenosis or aneurysm identified. CTA neck: 1. Patent carotid and vertebral arteries. No dissection, aneurysm, or hemodynamically significant stenosis is identified. 2. 5 mm right upper lobe pulmonary nodule. No follow-up needed if patient is low-risk. Non-contrast chest CT can be considered in 12 months if patient is high-risk. This recommendation follows the consensus statement: Guidelines for Management of Incidental Pulmonary Nodules Detected on CT Images: From the Fleischner Society 2017; Radiology 2017; 284:228-243. These results were called by telephone at the time of interpretation on 09/05/2016 at 9:09 pm to Dr. Cheral Marker, who verbally acknowledged these results. Electronically Signed   By: Kristine Garbe M.D.   On: 09/05/2016 21:26   Ct Cerebral Perfusion W Contrast  Result Date: 09/05/2016 EXAM: CT ANGIOGRAPHY HEAD AND NECK CT PERFUSION BRAIN TECHNIQUE: Multidetector CT imaging of the head and neck was performed using the standard protocol during bolus administration of intravenous contrast. Multiplanar CT image reconstructions and MIPs were obtained to evaluate the vascular anatomy. Carotid stenosis measurements (when applicable) are obtained utilizing NASCET criteria, using the distal internal carotid diameter as the denominator. Multiphase CT imaging of the brain was performed following IV bolus contrast injection. Subsequent parametric  perfusion maps were calculated using RAPID software. COMPARISON:  None. FINDINGS: CTA NECK FINDINGS Aortic arch: Bovine arch. Imaged portion shows no evidence of aneurysm or dissection. No significant  stenosis of the major arch vessel origins. Minimal calcific atherosclerosis of the aortic arch. Right carotid system: No evidence of dissection, stenosis (50% or greater) or occlusion. Moderate calcified plaque of the carotid bifurcation with minimal less than 30% proximal ICA stenosis. Left carotid system: No evidence of dissection, stenosis (50% or greater) or occlusion. Mild calcified plaque of the bifurcation. Vertebral arteries: Codominant. No evidence of dissection, stenosis (50% or greater) or occlusion. Skeleton: Moderate cervical degenerative changes with upper cervical facet arthrosis greater on the right and discogenic degenerative disease greatest at the C4 through C7 levels. No high-grade bony canal stenosis. Other neck: Negative. Upper chest: 5 mm nodule in the right upper lobe (series 8, image 347). Review of the MIP images confirms the above findings CTA HEAD FINDINGS Anterior circulation: Left MCA superior division origin occlusion with intermediate collateralization. The point of occlusion is in the left anterior sylvian fissure corresponding to the hyperdensity on prior noncontrast CT of the head (series 10, image 147) and at the branching origin of the inferior division which makes an approximately 90 degrees angle downward turn relative to the M1 (series 13, image 31). Otherwise the anterior circulation is patent without high-grade stenosis, aneurysm, or occlusion. Calcified plaque of the carotid siphons with mild right-sided distal cavernous stenosis. Posterior circulation: No significant stenosis, proximal occlusion, aneurysm, or vascular malformation. Venous sinuses: As permitted by contrast timing, patent. Anatomic variants: Right larger than left posterior communicating artery is and  diminutive patent anterior communicating artery. Review of the MIP images confirms the above findings CT Brain Perfusion Findings: CBF (<30%) Volume: 23mL Perfusion (Tmax>6.0s) volume: 131mL Mismatch Volume: 116mL Infarction Location:Perfusion anomaly is centered in the left lateral frontal and parietal lobes. IMPRESSION: CT perfusion: No infarct core (CBF less than 30%) by automated RAPID processing. At risk ischemia in the left superior MCA distribution with volume 108 cc. CTA head: 1. Left MCA superior division origin occlusion with intermediate collateralization. 2. Otherwise patent circle of Willis without significant stenosis or aneurysm identified. CTA neck: 1. Patent carotid and vertebral arteries. No dissection, aneurysm, or hemodynamically significant stenosis is identified. 2. 5 mm right upper lobe pulmonary nodule. No follow-up needed if patient is low-risk. Non-contrast chest CT can be considered in 12 months if patient is high-risk. This recommendation follows the consensus statement: Guidelines for Management of Incidental Pulmonary Nodules Detected on CT Images: From the Fleischner Society 2017; Radiology 2017; 284:228-243. These results were called by telephone at the time of interpretation on 09/05/2016 at 9:09 pm to Dr. Cheral Marker, who verbally acknowledged these results. Electronically Signed   By: Kristine Garbe M.D.   On: 09/05/2016 21:26   Portable Chest Xray  Result Date: 09/06/2016 CLINICAL DATA:  Respiratory failure, post intubation EXAM: PORTABLE CHEST 1 VIEW COMPARISON:  None. FINDINGS: The tip of an endotracheal tube is 4.4 cm above the carina. Heart is enlarged with mild vascular congestion. No aortic aneurysm. There is aortic atherosclerosis. No pneumothorax or pulmonary consolidation. No significant effusions. Left-sided ICD device with right ventricular lead is identified. A gastric tube extends below the left hemidiaphragm and appears to be coiled in the expected location  of the stomach. Old right third and fourth rib fractures. IMPRESSION: 1. Endotracheal tube tip in satisfactory position 4.4 cm above the carina. Gastric tube in the stomach. 2. Cardiomegaly with mild vascular congestion noted. Electronically Signed   By: Ashley Royalty M.D.   On: 09/06/2016 00:50   Ct Head Code Stroke W/o Cm  Result Date: 09/05/2016 CLINICAL  DATA:  Code stroke.  Aphasia EXAM: CT HEAD WITHOUT CONTRAST TECHNIQUE: Contiguous axial images were obtained from the base of the skull through the vertex without intravenous contrast. COMPARISON:  None. FINDINGS: Brain: Generalized atrophy. Negative for hydrocephalus. Negative for acute infarct. No intracranial hemorrhage or mass. Vascular: Short segment hyperdensity left M2 segment compatible with acute thrombus. No other hyperdense vessel. Atherosclerotic disease in the cavernous carotid bilaterally. Skull: Negative Sinuses/Orbits: Negative Other: None ASPECTS ([place] Stroke Program Early CT Score) - Ganglionic level infarction (caudate, lentiform nuclei, internal capsule, insula, M1-M3 cortex): 7 - Supraganglionic infarction (M4-M6 cortex): 3 Total score (0-10 with 10 being normal): 10 IMPRESSION: 1. Negative for acute infarct or hemorrhage. Hyperdense left M2 segment compatible with acute thrombosis. 2. ASPECTS is 10 These results were called by telephone at the time of interpretation on 09/05/2016 at 8:40 pm to Dr. Cheral Marker, who verbally acknowledged these results. Electronically Signed   By: Franchot Gallo M.D.   On: 09/05/2016 20:41    Labs:  CBC:  Recent Labs  09/05/16 2020 09/05/16 2028 09/06/16 0500  WBC 5.0  --  4.4  HGB 11.7* 12.9* 10.5*  HCT 34.3* 38.0* 30.5*  PLT 188  --  157    COAGS:  Recent Labs  09/05/16 2020  INR 1.30  APTT 33    BMP:  Recent Labs  09/05/16 2020 09/05/16 2028 09/06/16 0500  NA 135 139 137  K 4.1 4.4 3.9  CL 104 103 107  CO2 23  --  23  GLUCOSE 134* 139* 115*  BUN 15 19 13   CALCIUM 9.1   --  8.3*  CREATININE 1.31* 1.30* 1.11  GFRNONAA >60  --  >60  GFRAA >60  --  >60    LIVER FUNCTION TESTS:  Recent Labs  09/05/16 2020  BILITOT 0.9  AST 43*  ALT 33  ALKPHOS 72  PROT 7.1  ALBUMIN 4.0    Assessment and Plan: 1. S/P Lt common carotid arteriogram,followed by complete revascularization of occluded Lt MCA   Patient intubated, but follows commands and moves all 4 extremities. Plan to remove sheath this afternoon as he was on xarelto.  This will give him about 24 hrs off of this if he took it yesterday prior to pulling his sheath to minimize risk of bleeding. Further plans per neurology and CCM regarding extubation.  Electronically Signed: Henreitta Cea 09/06/2016, 9:51 AM   I spent a total of 15 Minutes at the the patient's bedside AND on the patient's hospital floor or unit, greater than 50% of which was counseling/coordinating care for CVA

## 2016-09-06 NOTE — Progress Notes (Signed)
[facility]  Asked to assess Glenn [name] for extubation.  I know him from his admission earlier today after he had an acute L MCA stroke requiring interventional radiology management.  Today he has had some improvement in his mentation and has been following commands on the ventilator, breathing comfortably on pressure support ventilation.   On exam Vitals:   09/06/16 1900 09/06/16 1946 09/06/16 1955 09/06/16 2015  BP: 127/63     Pulse: 84     Resp: 16     Temp:  (!) 100.5 F (38.1 C)    TempSrc:  Oral    SpO2: 96%  98% 97%  Weight:      Height:       Vent Mode: PSV FiO2 (%):  [40 %-100 %] 40 % Set Rate:  [16 bmp-18 bmp] 18 bmp Vt Set:  [530 mL-550 mL] 530 mL PEEP:  [5 cmH20] 5 cmH20 Pressure Support:  [5 cmH20-10 cmH20] 10 cmH20 Plateau Pressure:  [7 RVI15-37 cmH20] 7 cmH20  General:  In bed on vent HENT: NCAT ETT in place PULM: CTA B, vent supported breathing CV: Irreg irreg, no mgr GI: BS+, soft, nontender MSK: normal bulk and tone Neuro: will wake easily to voice, follows commands, raises his head off the bed on command easily  Chart reviewed, neurology/stroke service plans repeat CT head in the morning  CBC    Component Value Date/Time   WBC 4.4 09/06/2016 0500   RBC 3.16 (L) 09/06/2016 0500   HGB 10.5 (L) 09/06/2016 0500   HCT 30.5 (L) 09/06/2016 0500   PLT 157 09/06/2016 0500   MCV 96.5 09/06/2016 0500   MCH 33.2 09/06/2016 0500   MCHC 34.4 09/06/2016 0500   RDW 12.9 09/06/2016 0500   LYMPHSABS 1.0 09/06/2016 0500   MONOABS 0.3 09/06/2016 0500   EOSABS 0.1 09/06/2016 0500   BASOSABS 0.0 09/06/2016 0500   CXR port images reviewed, cardiomegally, pacer device in place, no infiltrate  Impression/Plan:  Acute ischemic stroke: per neurology, BP goals per neurology Acute respiratory failure with hypoxemia due to inability to protect airway: stable, passing SBT, will extubate tonight, oral care post extubation, keep NPO, SLP evaluation given history of  stroke  Hypertension: SBP goal per neurology, continue cleviprex  Additional CC time 35 minutes  Roselie Awkward, MD [facility] Pager: [phone] Cell: [phone] After 3pm or if no response, call [phone]

## 2016-09-06 NOTE — Progress Notes (Signed)
  Echocardiogram 2D Echocardiogram has been performed.  Glenn [name] 09/06/2016, 12:13 PM

## 2016-09-06 NOTE — Anesthesia Postprocedure Evaluation (Signed)
Anesthesia Post Note  Patient: Glenn [name]  Procedure(s) Performed: Procedure(s) (LRB): RADIOLOGY WITH ANESTHESIA (N/A)     Patient location during evaluation: PACU Anesthesia Type: General Level of consciousness: patient remains intubated per anesthesia plan Pain management: pain level controlled Vital Signs Assessment: post-procedure vital signs reviewed and stable Respiratory status: patient remains intubated per anesthesia plan Cardiovascular status: stable Anesthetic complications: no    Last Vitals:  Vitals:   09/06/16 0130 09/06/16 0200  BP: (!) 129/99 131/82  Pulse: (!) 40 93  Resp: 16 16  Temp:      Last Pain:  Vitals:   09/06/16 0000  TempSrc: Oral  PainSc:                  Glenn [name]

## 2016-09-06 NOTE — Progress Notes (Signed)
eLink Physician-Brief Progress Note Patient Name: Glenn [name] DOB: 03/10/1960 MRN: [id number]   Date of Service  09/06/2016  HPI/Events of Note  66 yo male with CHF, AFib with acute CVA S/P Lt common carotid arteriogram,followed by complete revascularization of occluded Lt MCA  Now intubated, PCCM asked to consult for vent and medical management  eICU Interventions  1.PCCM consulted 2.contineu vent support 3.started amiodarone infusion 4.avoid secondary brain injury 5.consider cardiology consult      Intervention Category Major Interventions: Arrhythmia - evaluation and management Evaluation Type: New Patient Evaluation  Bryce Cheever 09/06/2016, 12:37 AM

## 2016-09-06 NOTE — Progress Notes (Signed)
RT note- Patient is still sedated, changed back to full support, fio2 decreased to 40%, and set VT set at 8cc/kg at 543ml's.

## 2016-09-06 NOTE — Progress Notes (Addendum)
PULMONARY / CRITICAL CARE MEDICINE   Name: Glenn [name] MRN: [id number] DOB: 03/10/1960    ADMISSION DATE:  09/05/2016 CONSULTATION DATE:  09/05/16  REFERRING MD:  Cheral Marker  CHIEF COMPLAINT:  AMS  Brief patient summary:   Glenn [name] is a 66 y.o. male with PMH of chronic Afib on xarelto, CHF, Hepatitis C, and HTN. He presented to [facility] ED 7/18 as code stroke.  On arrival to ED, he was mute with right hemiparesis and right facial droop.  CT demonstrated hyperdense left M2 segment and CTA demonstrated left MCA occlusion.  He was subsequently taken to IR where he had left common carotid arteriogram followed by complete revascularization of occluded left MCA.  He then returned to the ICU on the ventilator and PCCM was asked to assist with vent management.  SUBJECTIVE:  Per RN no events overnight Propofol at 45 mcg/kg/min and cleviprex 3mg /hr    VITAL SIGNS: BP 123/60   Pulse 78   Temp 97.6 F (36.4 C) (Axillary)   Resp 17   Ht 5\' 7"  (1.702 m)   Wt 182 lb 15.7 oz (83 kg) Comment: From office visit notes 07/2016  SpO2 96%   BMI 28.66 kg/m   HEMODYNAMICS:   VENTILATOR SETTINGS: Vent Mode: PRVC FiO2 (%):  [40 %-100 %] 40 % Set Rate:  [16 bmp] 16 bmp Vt Set:  [530 mL-550 mL] 530 mL PEEP:  [5 cmH20] 5 cmH20 Pressure Support:  [5 cmH20-10 cmH20] 5 cmH20 Plateau Pressure:  [8 cmH20-20 cmH20] 8 cmH20  INTAKE / OUTPUT: I/O last 3 completed shifts: In: 2074.4 [I.V.:1964.4; IV Piggyback:110] Out: 86 [Urine:800; Blood:20]   PHYSICAL EXAMINATION: General:  Adult male in NAD HEENT: [place]/AT, MM pink/moist, OGT/ETT, pupils 3/=/reactive, sclerae anicteric Neuro: Sedated on 45 mcg propofol, awakens to voice, follows commands, MAE CV: Afib 76, irir PULM: even/non-labored on MV, lungs bilaterally coarse GI: soft, non-tender, bs active  Extremities: warm/dry, +1 BLE edema, right groin site wnl, dressing CDI Skin: no rashes or lesions  LABS:  BMET  Recent Labs Lab  09/05/16 2020 09/05/16 2028 09/06/16 0500  NA 135 139 137  K 4.1 4.4 3.9  CL 104 103 107  CO2 23  --  23  BUN 15 19 13   CREATININE 1.31* 1.30* 1.11  GLUCOSE 134* 139* 115*    Electrolytes  Recent Labs Lab 09/05/16 2020 09/06/16 0500  CALCIUM 9.1 8.3*    CBC  Recent Labs Lab 09/05/16 2020 09/05/16 2028 09/06/16 0500  WBC 5.0  --  4.4  HGB 11.7* 12.9* 10.5*  HCT 34.3* 38.0* 30.5*  PLT 188  --  157    Coag's  Recent Labs Lab 09/05/16 2020  APTT 33  INR 1.30    Sepsis Markers No results for input(s): LATICACIDVEN, PROCALCITON, O2SATVEN in the last 168 hours.  ABG  Recent Labs Lab 09/06/16 0049  PHART 7.270*  PCO2ART 52.1*  PO2ART 239.0*    Liver Enzymes  Recent Labs Lab 09/05/16 2020  AST 43*  ALT 33  ALKPHOS 72  BILITOT 0.9  ALBUMIN 4.0    Cardiac Enzymes No results for input(s): TROPONINI, PROBNP in the last 168 hours.  Glucose  Recent Labs Lab 09/05/16 2019  GLUCAP 135*    Imaging Ct Angio Head W Or Wo Contrast  Result Date: 09/05/2016 EXAM: CT ANGIOGRAPHY HEAD AND NECK CT PERFUSION BRAIN TECHNIQUE: Multidetector CT imaging of the head and neck was performed using the standard protocol during bolus administration of intravenous contrast. Multiplanar CT image  reconstructions and MIPs were obtained to evaluate the vascular anatomy. Carotid stenosis measurements (when applicable) are obtained utilizing NASCET criteria, using the distal internal carotid diameter as the denominator. Multiphase CT imaging of the brain was performed following IV bolus contrast injection. Subsequent parametric perfusion maps were calculated using RAPID software. COMPARISON:  None. FINDINGS: CTA NECK FINDINGS Aortic arch: Bovine arch. Imaged portion shows no evidence of aneurysm or dissection. No significant stenosis of the major arch vessel origins. Minimal calcific atherosclerosis of the aortic arch. Right carotid system: No evidence of dissection, stenosis  (50% or greater) or occlusion. Moderate calcified plaque of the carotid bifurcation with minimal less than 30% proximal ICA stenosis. Left carotid system: No evidence of dissection, stenosis (50% or greater) or occlusion. Mild calcified plaque of the bifurcation. Vertebral arteries: Codominant. No evidence of dissection, stenosis (50% or greater) or occlusion. Skeleton: Moderate cervical degenerative changes with upper cervical facet arthrosis greater on the right and discogenic degenerative disease greatest at the C4 through C7 levels. No high-grade bony canal stenosis. Other neck: Negative. Upper chest: 5 mm nodule in the right upper lobe (series 8, image 347). Review of the MIP images confirms the above findings CTA HEAD FINDINGS Anterior circulation: Left MCA superior division origin occlusion with intermediate collateralization. The point of occlusion is in the left anterior sylvian fissure corresponding to the hyperdensity on prior noncontrast CT of the head (series 10, image 147) and at the branching origin of the inferior division which makes an approximately 90 degrees angle downward turn relative to the M1 (series 13, image 31). Otherwise the anterior circulation is patent without high-grade stenosis, aneurysm, or occlusion. Calcified plaque of the carotid siphons with mild right-sided distal cavernous stenosis. Posterior circulation: No significant stenosis, proximal occlusion, aneurysm, or vascular malformation. Venous sinuses: As permitted by contrast timing, patent. Anatomic variants: Right larger than left posterior communicating artery is and diminutive patent anterior communicating artery. Review of the MIP images confirms the above findings CT Brain Perfusion Findings: CBF (<30%) Volume: 44mL Perfusion (Tmax>6.0s) volume: 175mL Mismatch Volume: 167mL Infarction Location:Perfusion anomaly is centered in the left lateral frontal and parietal lobes. IMPRESSION: CT perfusion: No infarct core (CBF less  than 30%) by automated RAPID processing. At risk ischemia in the left superior MCA distribution with volume 108 cc. CTA head: 1. Left MCA superior division origin occlusion with intermediate collateralization. 2. Otherwise patent circle of Willis without significant stenosis or aneurysm identified. CTA neck: 1. Patent carotid and vertebral arteries. No dissection, aneurysm, or hemodynamically significant stenosis is identified. 2. 5 mm right upper lobe pulmonary nodule. No follow-up needed if patient is low-risk. Non-contrast chest CT can be considered in 12 months if patient is high-risk. This recommendation follows the consensus statement: Guidelines for Management of Incidental Pulmonary Nodules Detected on CT Images: From the Fleischner Society 2017; Radiology 2017; 284:228-243. These results were called by telephone at the time of interpretation on 09/05/2016 at 9:09 pm to Dr. Cheral Marker, who verbally acknowledged these results. Electronically Signed   By: Kristine Garbe M.D.   On: 09/05/2016 21:26   Ct Head Wo Contrast  Result Date: 09/06/2016 CLINICAL DATA:  Status post neurovascular intervention. EXAM: CT HEAD WITHOUT CONTRAST TECHNIQUE: Contiguous axial images were obtained from the base of the skull through the vertex without intravenous contrast. COMPARISON:  CTA head neck 09/05/2016 CTP head 09/05/2016 FINDINGS: Brain: No mass lesion, intraparenchymal hemorrhage or extra-axial collection. No evidence of acute cortical infarct. There is periventricular hypoattenuation compatible with chronic microvascular disease.  Vascular: No hyperdense vessel or unexpected calcification. Skull: Normal visualized skull base, calvarium and extracranial soft tissues. Sinuses/Orbits: No sinus fluid levels or advanced mucosal thickening. No mastoid effusion. Normal orbits. IMPRESSION: No hemorrhage or other acute abnormality following neurovascular intervention. Gray-white differentiation is preserved in the region  of ischemia demonstrated on the earlier perfusion study. Electronically Signed   By: Ulyses Jarred M.D.   On: 09/06/2016 00:30   Ct Angio Neck W And/or Wo Contrast  Result Date: 09/05/2016 EXAM: CT ANGIOGRAPHY HEAD AND NECK CT PERFUSION BRAIN TECHNIQUE: Multidetector CT imaging of the head and neck was performed using the standard protocol during bolus administration of intravenous contrast. Multiplanar CT image reconstructions and MIPs were obtained to evaluate the vascular anatomy. Carotid stenosis measurements (when applicable) are obtained utilizing NASCET criteria, using the distal internal carotid diameter as the denominator. Multiphase CT imaging of the brain was performed following IV bolus contrast injection. Subsequent parametric perfusion maps were calculated using RAPID software. COMPARISON:  None. FINDINGS: CTA NECK FINDINGS Aortic arch: Bovine arch. Imaged portion shows no evidence of aneurysm or dissection. No significant stenosis of the major arch vessel origins. Minimal calcific atherosclerosis of the aortic arch. Right carotid system: No evidence of dissection, stenosis (50% or greater) or occlusion. Moderate calcified plaque of the carotid bifurcation with minimal less than 30% proximal ICA stenosis. Left carotid system: No evidence of dissection, stenosis (50% or greater) or occlusion. Mild calcified plaque of the bifurcation. Vertebral arteries: Codominant. No evidence of dissection, stenosis (50% or greater) or occlusion. Skeleton: Moderate cervical degenerative changes with upper cervical facet arthrosis greater on the right and discogenic degenerative disease greatest at the C4 through C7 levels. No high-grade bony canal stenosis. Other neck: Negative. Upper chest: 5 mm nodule in the right upper lobe (series 8, image 347). Review of the MIP images confirms the above findings CTA HEAD FINDINGS Anterior circulation: Left MCA superior division origin occlusion with intermediate  collateralization. The point of occlusion is in the left anterior sylvian fissure corresponding to the hyperdensity on prior noncontrast CT of the head (series 10, image 147) and at the branching origin of the inferior division which makes an approximately 90 degrees angle downward turn relative to the M1 (series 13, image 31). Otherwise the anterior circulation is patent without high-grade stenosis, aneurysm, or occlusion. Calcified plaque of the carotid siphons with mild right-sided distal cavernous stenosis. Posterior circulation: No significant stenosis, proximal occlusion, aneurysm, or vascular malformation. Venous sinuses: As permitted by contrast timing, patent. Anatomic variants: Right larger than left posterior communicating artery is and diminutive patent anterior communicating artery. Review of the MIP images confirms the above findings CT Brain Perfusion Findings: CBF (<30%) Volume: 32mL Perfusion (Tmax>6.0s) volume: 167mL Mismatch Volume: 140mL Infarction Location:Perfusion anomaly is centered in the left lateral frontal and parietal lobes. IMPRESSION: CT perfusion: No infarct core (CBF less than 30%) by automated RAPID processing. At risk ischemia in the left superior MCA distribution with volume 108 cc. CTA head: 1. Left MCA superior division origin occlusion with intermediate collateralization. 2. Otherwise patent circle of Willis without significant stenosis or aneurysm identified. CTA neck: 1. Patent carotid and vertebral arteries. No dissection, aneurysm, or hemodynamically significant stenosis is identified. 2. 5 mm right upper lobe pulmonary nodule. No follow-up needed if patient is low-risk. Non-contrast chest CT can be considered in 12 months if patient is high-risk. This recommendation follows the consensus statement: Guidelines for Management of Incidental Pulmonary Nodules Detected on CT Images: From the [place]  2017; Radiology 2017; 798:921-194. These results were called by  telephone at the time of interpretation on 09/05/2016 at 9:09 pm to Dr. Cheral Marker, who verbally acknowledged these results. Electronically Signed   By: Kristine Garbe M.D.   On: 09/05/2016 21:26   Ct Cerebral Perfusion W Contrast  Result Date: 09/05/2016 EXAM: CT ANGIOGRAPHY HEAD AND NECK CT PERFUSION BRAIN TECHNIQUE: Multidetector CT imaging of the head and neck was performed using the standard protocol during bolus administration of intravenous contrast. Multiplanar CT image reconstructions and MIPs were obtained to evaluate the vascular anatomy. Carotid stenosis measurements (when applicable) are obtained utilizing NASCET criteria, using the distal internal carotid diameter as the denominator. Multiphase CT imaging of the brain was performed following IV bolus contrast injection. Subsequent parametric perfusion maps were calculated using RAPID software. COMPARISON:  None. FINDINGS: CTA NECK FINDINGS Aortic arch: Bovine arch. Imaged portion shows no evidence of aneurysm or dissection. No significant stenosis of the major arch vessel origins. Minimal calcific atherosclerosis of the aortic arch. Right carotid system: No evidence of dissection, stenosis (50% or greater) or occlusion. Moderate calcified plaque of the carotid bifurcation with minimal less than 30% proximal ICA stenosis. Left carotid system: No evidence of dissection, stenosis (50% or greater) or occlusion. Mild calcified plaque of the bifurcation. Vertebral arteries: Codominant. No evidence of dissection, stenosis (50% or greater) or occlusion. Skeleton: Moderate cervical degenerative changes with upper cervical facet arthrosis greater on the right and discogenic degenerative disease greatest at the C4 through C7 levels. No high-grade bony canal stenosis. Other neck: Negative. Upper chest: 5 mm nodule in the right upper lobe (series 8, image 347). Review of the MIP images confirms the above findings CTA HEAD FINDINGS Anterior circulation:  Left MCA superior division origin occlusion with intermediate collateralization. The point of occlusion is in the left anterior sylvian fissure corresponding to the hyperdensity on prior noncontrast CT of the head (series 10, image 147) and at the branching origin of the inferior division which makes an approximately 90 degrees angle downward turn relative to the M1 (series 13, image 31). Otherwise the anterior circulation is patent without high-grade stenosis, aneurysm, or occlusion. Calcified plaque of the carotid siphons with mild right-sided distal cavernous stenosis. Posterior circulation: No significant stenosis, proximal occlusion, aneurysm, or vascular malformation. Venous sinuses: As permitted by contrast timing, patent. Anatomic variants: Right larger than left posterior communicating artery is and diminutive patent anterior communicating artery. Review of the MIP images confirms the above findings CT Brain Perfusion Findings: CBF (<30%) Volume: 76mL Perfusion (Tmax>6.0s) volume: 161mL Mismatch Volume: 154mL Infarction Location:Perfusion anomaly is centered in the left lateral frontal and parietal lobes. IMPRESSION: CT perfusion: No infarct core (CBF less than 30%) by automated RAPID processing. At risk ischemia in the left superior MCA distribution with volume 108 cc. CTA head: 1. Left MCA superior division origin occlusion with intermediate collateralization. 2. Otherwise patent circle of Willis without significant stenosis or aneurysm identified. CTA neck: 1. Patent carotid and vertebral arteries. No dissection, aneurysm, or hemodynamically significant stenosis is identified. 2. 5 mm right upper lobe pulmonary nodule. No follow-up needed if patient is low-risk. Non-contrast chest CT can be considered in 12 months if patient is high-risk. This recommendation follows the consensus statement: Guidelines for Management of Incidental Pulmonary Nodules Detected on CT Images: From the Fleischner Society 2017;  Radiology 2017; 284:228-243. These results were called by telephone at the time of interpretation on 09/05/2016 at 9:09 pm to Dr. Cheral Marker, who verbally acknowledged these results. Electronically  Signed   By: Kristine Garbe M.D.   On: 09/05/2016 21:26   Portable Chest Xray  Result Date: 09/06/2016 CLINICAL DATA:  Respiratory failure, post intubation EXAM: PORTABLE CHEST 1 VIEW COMPARISON:  None. FINDINGS: The tip of an endotracheal tube is 4.4 cm above the carina. Heart is enlarged with mild vascular congestion. No aortic aneurysm. There is aortic atherosclerosis. No pneumothorax or pulmonary consolidation. No significant effusions. Left-sided ICD device with right ventricular lead is identified. A gastric tube extends below the left hemidiaphragm and appears to be coiled in the expected location of the stomach. Old right third and fourth rib fractures. IMPRESSION: 1. Endotracheal tube tip in satisfactory position 4.4 cm above the carina. Gastric tube in the stomach. 2. Cardiomegaly with mild vascular congestion noted. Electronically Signed   By: Ashley Royalty M.D.   On: 09/06/2016 00:50   Ct Head Code Stroke W/o Cm  Result Date: 09/05/2016 CLINICAL DATA:  Code stroke.  Aphasia EXAM: CT HEAD WITHOUT CONTRAST TECHNIQUE: Contiguous axial images were obtained from the base of the skull through the vertex without intravenous contrast. COMPARISON:  None. FINDINGS: Brain: Generalized atrophy. Negative for hydrocephalus. Negative for acute infarct. No intracranial hemorrhage or mass. Vascular: Short segment hyperdensity left M2 segment compatible with acute thrombus. No other hyperdense vessel. Atherosclerotic disease in the cavernous carotid bilaterally. Skull: Negative Sinuses/Orbits: Negative Other: None ASPECTS ([place] Stroke Program Early CT Score) - Ganglionic level infarction (caudate, lentiform nuclei, internal capsule, insula, M1-M3 cortex): 7 - Supraganglionic infarction (M4-M6 cortex): 3 Total  score (0-10 with 10 being normal): 10 IMPRESSION: 1. Negative for acute infarct or hemorrhage. Hyperdense left M2 segment compatible with acute thrombosis. 2. ASPECTS is 10 These results were called by telephone at the time of interpretation on 09/05/2016 at 8:40 pm to Dr. Cheral Marker, who verbally acknowledged these results. Electronically Signed   By: Franchot Gallo M.D.   On: 09/05/2016 20:41     STUDIES:  CT head 7/18 > hyperdense left M2 segment. CTA head 7/18 > let MCA occlusion.  CULTURES: None.  ANTIBIOTICS: None.  SIGNIFICANT EVENTS: 7/18 > admit.  LINES/TUBES: ETT 7/18 >   DISCUSSION: 66 y.o. male admitted 7/18 with left MCA occlusion.  Taken to IR for revascularization then returned to ICU on vent.  ASSESSMENT / PLAN:  NEUROLOGIC A:   Acute encephalopathy - due to sedation. Left MCA occlusion - s/p IR revascularization 7/18. P:   PAD protocol Continue Propofol gtt / Fentanyl PRN. RASS goal: 0 to -1. Daily WUA. Neurology following. Stroke workup / management per neuro. Holding preadmission buspirone.  PULMONARY A: Respiratory insufficiency - due to inability to extubate post IR procedure. Tobacco abuse -unclear why he takes Advair, no documentation of lung disease P:   PRVC 8 cc/kg ABG noted, increase rate to 18 SBT as able, hold extubation till after sheath removed VAP prevention measures. Continue Budesonide / Brovana CXR prn  Tobacco cessation when appropiate  CARDIOVASCULAR A:  A.fib with RVR - rate controlled after sedation Hx CHF (no echo on file), A.fib (on xarelto) - rate controlled after sedated, amio not started P:  ICU monitoring  Goal SBP 110-140 per Deveshwar Cleviprex for SBP goals Consider TTE per primary  Hold restarting lisinopril given AKI/ CTA dye Holding preadmission coreg, furosemide, hydralazine, rivaroxaban  RENAL A:   AKI - unknown baseline. Hypocalcemia- ? R/t vitamin D deficiency P:   Check Mag, phos, vit D, PTH and  ionized calcium  Continue vitamin D daily  Decrease  fluids to NS @ 50. Trend BMP / urinary output Replace electrolytes as indicated Avoid nephrotoxic agents, ensure adequate renal perfusion  GASTROINTESTINAL A:   GI prophylaxis. Nutrition. Hx HCV. P:   SUP: Pantoprazole. NPO. Consider TF if not extubated today  HEMATOLOGIC A:   Anemia VTE Prophylaxis. P:  SCD's Trend CBC  INFECTIOUS A:   No indication of infection. P:   Monitor clinically.  ENDOCRINE A:   No acute issues.   P:   Monitor glucose on BMET  Family updated: No family at bedside.   Interdisciplinary Family Meeting v Palliative Care Meeting:  Due by: 09/12/16.  CC time: 40 min.  Kennieth Rad, AGACNP-BC [facility] Pgr: [phone] or if no answer [phone] 09/06/2016, 10:02 AM  Attending Note:  I have examined patient, reviewed labs, studies and notes. I have discussed the case with B Simpson and I agree with the data and plans as amended above. 66 yo man, hx of A fib (was on xarelto), Hep C, CHF, HTN (TTE pending). Smoker with possible COPD / asthma. Admitted with L MCA stroke, went for revascularization 7/18. Returned to ICU intubated. On  my eval he remains on sedation but will wake and follow some commands. Some R neglect, is able to move R extremities. Labs show hypocalcemia, on Vit D at home. He still has a sheath in place, to be removed this pm. We will lighten sedation when sheath out, push for PSV and hopefully quick extubation. Follow TTE results. Plan resume anti-HTN regimen once sedation lightened and BP will tolerate. ? Whether he had been compliant with Xarelto as an outpt.  Independent critical care time is 35 minutes.   Baltazar Apo, MD, PhD 09/06/2016, 11:31 AM [facility] [phone] or if no answer [phone]

## 2016-09-06 NOTE — ED Provider Notes (Signed)
[facility] DEPT Provider Note   CSN: [id number] Arrival date & time: 09/05/16  2025   An emergency department physician performed an initial assessment on this suspected stroke patient at 2019.  History   Chief Complaint Chief Complaint  Patient presents with  . Code Stroke    HPI Cliff Damiani is a 66 y.o. male.  HPI Level V caveat due to aphasia. Patient brought in as a code stroke. Last normal at 7:30. EMS initially called for shortness of breath. But found that he could not talk. Left-sided facial droop. Difficulty moving right side. Is reportedly on anticoagulation already. Xarelto. Past Medical History:  Diagnosis Date  . CHF (congestive heart failure) ([place])   . Hepatitis C   . Hypertension     Patient Active Problem List   Diagnosis Date Noted  . Stroke (cerebrum) ([place]) 09/05/2016  . CHF (congestive heart failure) ([place]) 08/14/2016  . HTN (hypertension) 08/14/2016  . Hepatitis C 08/14/2016    Past Surgical History:  Procedure Laterality Date  . CARDIAC DEFIBRILLATOR PLACEMENT  2015  . EYE SURGERY Left 1990       Home Medications    Prior to Admission medications   Medication Sig Start Date End Date Taking? Authorizing Provider  aspirin EC 81 MG tablet Take 81 mg by mouth daily.    [provider]  busPIRone (BUSPAR) 7.5 MG tablet Take 7.5 mg by mouth daily.    [provider]  carvedilol (COREG) 25 MG tablet Take 1.5 tablets (37.5 mg total) by mouth 2 (two) times daily with a meal. 08/14/16   Bacigalupo, Dionne Bucy, MD  Cholecalciferol (VITAMIN D3) 1000 units CAPS Take 1,000 Units by mouth daily.    [provider]  Fluticasone-Salmeterol (ADVAIR) 250-50 MCG/DOSE AEPB Inhale 1 puff into the lungs 2 (two) times daily.    [provider]  furosemide (LASIX) 20 MG tablet Take 1 tablet (20 mg total) by mouth daily. 08/14/16   Virginia Crews, MD  hydrALAZINE (APRESOLINE) 25 MG tablet Take 25 mg by mouth 2 (two)  times daily.    [provider]  isosorbide mononitrate (ISMO,MONOKET) 10 MG tablet Take 10 mg by mouth daily.    [provider]  lisinopril (PRINIVIL,ZESTRIL) 5 MG tablet Take 5 mg by mouth daily.    [provider]  omeprazole (PRILOSEC) 20 MG capsule Take 20 mg by mouth daily.    [provider]  rivaroxaban (XARELTO) 20 MG TABS tablet Take 20 mg by mouth daily with supper.    [provider]  thiamine (VITAMIN B-1) 100 MG tablet Take 100 mg by mouth daily.    [provider]    Family History History reviewed. No pertinent family history.  Social History Social History  Substance Use Topics  . Smoking status: Current Every Day Smoker    Packs/day: 0.50    Types: Cigarettes  . Smokeless tobacco: Never Used  . Alcohol use 1.8 oz/week    3 Cans of beer per week     Allergies   Patient has no known allergies.   Review of Systems Review of Systems  Unable to perform ROS: Patient nonverbal     Physical Exam Updated Vital Signs BP 110/67   Pulse (!) 107   Temp 97.6 F (36.4 C) (Axillary)   Resp 16   Wt 83 kg (182 lb 15.7 oz) Comment: From office visit notes 07/2016  SpO2 100%   Physical Exam  Constitutional: He appears well-developed.  HENT:  Head: Normocephalic.  Left-sided facial droop  Neck: Neck supple.  Cardiovascular: Normal rate.   Pulmonary/Chest: Effort normal.  Abdominal: Soft.  Musculoskeletal: He exhibits no edema.  Neurological:  Left-sided facial droop. Nonverbal. Eyes will not cross the midline to the right. Weakness on right compared to left. Complete NIH scoring done by neurology.  Skin: Skin is warm. Capillary refill takes less than 2 seconds.  Psychiatric: He has a normal mood and affect.     ED Treatments / Results  Labs (all labs ordered are listed, but only abnormal results are displayed) Labs Reviewed  ETHANOL - Abnormal; Notable for the following:       Result Value   Alcohol,  Ethyl (B) 8 (*)    All other components within normal limits  PROTIME-INR - Abnormal; Notable for the following:    Prothrombin Time 16.3 (*)    All other components within normal limits  CBC - Abnormal; Notable for the following:    RBC 3.57 (*)    Hemoglobin 11.7 (*)    HCT 34.3 (*)    All other components within normal limits  DIFFERENTIAL - Abnormal; Notable for the following:    Neutro Abs 1.5 (*)    Eosinophils Absolute 0.8 (*)    All other components within normal limits  COMPREHENSIVE METABOLIC PANEL - Abnormal; Notable for the following:    Glucose, Bld 134 (*)    Creatinine, Ser 1.31 (*)    AST 43 (*)    All other components within normal limits  I-STAT CHEM 8, ED - Abnormal; Notable for the following:    Creatinine, Ser 1.30 (*)    Glucose, Bld 139 (*)    Calcium, Ion 1.07 (*)    Hemoglobin 12.9 (*)    HCT 38.0 (*)    All other components within normal limits  MRSA PCR SCREENING  APTT  RAPID URINE DRUG SCREEN, HOSP PERFORMED  URINALYSIS, ROUTINE W REFLEX MICROSCOPIC  HIV ANTIBODY (ROUTINE TESTING)  HEMOGLOBIN A1C  LIPID PANEL  BLOOD GAS, ARTERIAL  CBC WITH DIFFERENTIAL/PLATELET  BASIC METABOLIC PANEL  TRIGLYCERIDES  I-STAT TROPONIN, ED  I-STAT CHEM 8, ED    EKG  EKG Interpretation None       Radiology Ct Angio Head W Or Wo Contrast  Result Date: 09/05/2016 EXAM: CT ANGIOGRAPHY HEAD AND NECK CT PERFUSION BRAIN TECHNIQUE: Multidetector CT imaging of the head and neck was performed using the standard protocol during bolus administration of intravenous contrast. Multiplanar CT image reconstructions and MIPs were obtained to evaluate the vascular anatomy. Carotid stenosis measurements (when applicable) are obtained utilizing NASCET criteria, using the distal internal carotid diameter as the denominator. Multiphase CT imaging of the brain was performed following IV bolus contrast injection. Subsequent parametric perfusion maps were calculated using RAPID  software. COMPARISON:  None. FINDINGS: CTA NECK FINDINGS Aortic arch: Bovine arch. Imaged portion shows no evidence of aneurysm or dissection. No significant stenosis of the major arch vessel origins. Minimal calcific atherosclerosis of the aortic arch. Right carotid system: No evidence of dissection, stenosis (50% or greater) or occlusion. Moderate calcified plaque of the carotid bifurcation with minimal less than 30% proximal ICA stenosis. Left carotid system: No evidence of dissection, stenosis (50% or greater) or occlusion. Mild calcified plaque of the bifurcation. Vertebral arteries: Codominant. No evidence of dissection, stenosis (50% or greater) or occlusion. Skeleton: Moderate cervical degenerative changes with upper cervical facet arthrosis greater on the right and discogenic degenerative disease greatest at the C4 through C7 levels.  No high-grade bony canal stenosis. Other neck: Negative. Upper chest: 5 mm nodule in the right upper lobe (series 8, image 347). Review of the MIP images confirms the above findings CTA HEAD FINDINGS Anterior circulation: Left MCA superior division origin occlusion with intermediate collateralization. The point of occlusion is in the left anterior sylvian fissure corresponding to the hyperdensity on prior noncontrast CT of the head (series 10, image 147) and at the branching origin of the inferior division which makes an approximately 90 degrees angle downward turn relative to the M1 (series 13, image 31). Otherwise the anterior circulation is patent without high-grade stenosis, aneurysm, or occlusion. Calcified plaque of the carotid siphons with mild right-sided distal cavernous stenosis. Posterior circulation: No significant stenosis, proximal occlusion, aneurysm, or vascular malformation. Venous sinuses: As permitted by contrast timing, patent. Anatomic variants: Right larger than left posterior communicating artery is and diminutive patent anterior communicating artery.  Review of the MIP images confirms the above findings CT Brain Perfusion Findings: CBF (<30%) Volume: 49mL Perfusion (Tmax>6.0s) volume: 130mL Mismatch Volume: 173mL Infarction Location:Perfusion anomaly is centered in the left lateral frontal and parietal lobes. IMPRESSION: CT perfusion: No infarct core (CBF less than 30%) by automated RAPID processing. At risk ischemia in the left superior MCA distribution with volume 108 cc. CTA head: 1. Left MCA superior division origin occlusion with intermediate collateralization. 2. Otherwise patent circle of Willis without significant stenosis or aneurysm identified. CTA neck: 1. Patent carotid and vertebral arteries. No dissection, aneurysm, or hemodynamically significant stenosis is identified. 2. 5 mm right upper lobe pulmonary nodule. No follow-up needed if patient is low-risk. Non-contrast chest CT can be considered in 12 months if patient is high-risk. This recommendation follows the consensus statement: Guidelines for Management of Incidental Pulmonary Nodules Detected on CT Images: From the Fleischner Society 2017; Radiology 2017; 284:228-243. These results were called by telephone at the time of interpretation on 09/05/2016 at 9:09 pm to Dr. Cheral Marker, who verbally acknowledged these results. Electronically Signed   By: Kristine Garbe M.D.   On: 09/05/2016 21:26   Ct Head Wo Contrast  Result Date: 09/06/2016 CLINICAL DATA:  Status post neurovascular intervention. EXAM: CT HEAD WITHOUT CONTRAST TECHNIQUE: Contiguous axial images were obtained from the base of the skull through the vertex without intravenous contrast. COMPARISON:  CTA head neck 09/05/2016 CTP head 09/05/2016 FINDINGS: Brain: No mass lesion, intraparenchymal hemorrhage or extra-axial collection. No evidence of acute cortical infarct. There is periventricular hypoattenuation compatible with chronic microvascular disease. Vascular: No hyperdense vessel or unexpected calcification. Skull: Normal  visualized skull base, calvarium and extracranial soft tissues. Sinuses/Orbits: No sinus fluid levels or advanced mucosal thickening. No mastoid effusion. Normal orbits. IMPRESSION: No hemorrhage or other acute abnormality following neurovascular intervention. Gray-white differentiation is preserved in the region of ischemia demonstrated on the earlier perfusion study. Electronically Signed   By: Ulyses Jarred M.D.   On: 09/06/2016 00:30   Ct Angio Neck W And/or Wo Contrast  Result Date: 09/05/2016 EXAM: CT ANGIOGRAPHY HEAD AND NECK CT PERFUSION BRAIN TECHNIQUE: Multidetector CT imaging of the head and neck was performed using the standard protocol during bolus administration of intravenous contrast. Multiplanar CT image reconstructions and MIPs were obtained to evaluate the vascular anatomy. Carotid stenosis measurements (when applicable) are obtained utilizing NASCET criteria, using the distal internal carotid diameter as the denominator. Multiphase CT imaging of the brain was performed following IV bolus contrast injection. Subsequent parametric perfusion maps were calculated using RAPID software. COMPARISON:  None. FINDINGS: CTA  NECK FINDINGS Aortic arch: Bovine arch. Imaged portion shows no evidence of aneurysm or dissection. No significant stenosis of the major arch vessel origins. Minimal calcific atherosclerosis of the aortic arch. Right carotid system: No evidence of dissection, stenosis (50% or greater) or occlusion. Moderate calcified plaque of the carotid bifurcation with minimal less than 30% proximal ICA stenosis. Left carotid system: No evidence of dissection, stenosis (50% or greater) or occlusion. Mild calcified plaque of the bifurcation. Vertebral arteries: Codominant. No evidence of dissection, stenosis (50% or greater) or occlusion. Skeleton: Moderate cervical degenerative changes with upper cervical facet arthrosis greater on the right and discogenic degenerative disease greatest at the C4  through C7 levels. No high-grade bony canal stenosis. Other neck: Negative. Upper chest: 5 mm nodule in the right upper lobe (series 8, image 347). Review of the MIP images confirms the above findings CTA HEAD FINDINGS Anterior circulation: Left MCA superior division origin occlusion with intermediate collateralization. The point of occlusion is in the left anterior sylvian fissure corresponding to the hyperdensity on prior noncontrast CT of the head (series 10, image 147) and at the branching origin of the inferior division which makes an approximately 90 degrees angle downward turn relative to the M1 (series 13, image 31). Otherwise the anterior circulation is patent without high-grade stenosis, aneurysm, or occlusion. Calcified plaque of the carotid siphons with mild right-sided distal cavernous stenosis. Posterior circulation: No significant stenosis, proximal occlusion, aneurysm, or vascular malformation. Venous sinuses: As permitted by contrast timing, patent. Anatomic variants: Right larger than left posterior communicating artery is and diminutive patent anterior communicating artery. Review of the MIP images confirms the above findings CT Brain Perfusion Findings: CBF (<30%) Volume: 66mL Perfusion (Tmax>6.0s) volume: 162mL Mismatch Volume: 15mL Infarction Location:Perfusion anomaly is centered in the left lateral frontal and parietal lobes. IMPRESSION: CT perfusion: No infarct core (CBF less than 30%) by automated RAPID processing. At risk ischemia in the left superior MCA distribution with volume 108 cc. CTA head: 1. Left MCA superior division origin occlusion with intermediate collateralization. 2. Otherwise patent circle of Willis without significant stenosis or aneurysm identified. CTA neck: 1. Patent carotid and vertebral arteries. No dissection, aneurysm, or hemodynamically significant stenosis is identified. 2. 5 mm right upper lobe pulmonary nodule. No follow-up needed if patient is low-risk.  Non-contrast chest CT can be considered in 12 months if patient is high-risk. This recommendation follows the consensus statement: Guidelines for Management of Incidental Pulmonary Nodules Detected on CT Images: From the Fleischner Society 2017; Radiology 2017; 284:228-243. These results were called by telephone at the time of interpretation on 09/05/2016 at 9:09 pm to Dr. Cheral Marker, who verbally acknowledged these results. Electronically Signed   By: Kristine Garbe M.D.   On: 09/05/2016 21:26   Ct Cerebral Perfusion W Contrast  Result Date: 09/05/2016 EXAM: CT ANGIOGRAPHY HEAD AND NECK CT PERFUSION BRAIN TECHNIQUE: Multidetector CT imaging of the head and neck was performed using the standard protocol during bolus administration of intravenous contrast. Multiplanar CT image reconstructions and MIPs were obtained to evaluate the vascular anatomy. Carotid stenosis measurements (when applicable) are obtained utilizing NASCET criteria, using the distal internal carotid diameter as the denominator. Multiphase CT imaging of the brain was performed following IV bolus contrast injection. Subsequent parametric perfusion maps were calculated using RAPID software. COMPARISON:  None. FINDINGS: CTA NECK FINDINGS Aortic arch: Bovine arch. Imaged portion shows no evidence of aneurysm or dissection. No significant stenosis of the major arch vessel origins. Minimal calcific atherosclerosis of the aortic  arch. Right carotid system: No evidence of dissection, stenosis (50% or greater) or occlusion. Moderate calcified plaque of the carotid bifurcation with minimal less than 30% proximal ICA stenosis. Left carotid system: No evidence of dissection, stenosis (50% or greater) or occlusion. Mild calcified plaque of the bifurcation. Vertebral arteries: Codominant. No evidence of dissection, stenosis (50% or greater) or occlusion. Skeleton: Moderate cervical degenerative changes with upper cervical facet arthrosis greater on the  right and discogenic degenerative disease greatest at the C4 through C7 levels. No high-grade bony canal stenosis. Other neck: Negative. Upper chest: 5 mm nodule in the right upper lobe (series 8, image 347). Review of the MIP images confirms the above findings CTA HEAD FINDINGS Anterior circulation: Left MCA superior division origin occlusion with intermediate collateralization. The point of occlusion is in the left anterior sylvian fissure corresponding to the hyperdensity on prior noncontrast CT of the head (series 10, image 147) and at the branching origin of the inferior division which makes an approximately 90 degrees angle downward turn relative to the M1 (series 13, image 31). Otherwise the anterior circulation is patent without high-grade stenosis, aneurysm, or occlusion. Calcified plaque of the carotid siphons with mild right-sided distal cavernous stenosis. Posterior circulation: No significant stenosis, proximal occlusion, aneurysm, or vascular malformation. Venous sinuses: As permitted by contrast timing, patent. Anatomic variants: Right larger than left posterior communicating artery is and diminutive patent anterior communicating artery. Review of the MIP images confirms the above findings CT Brain Perfusion Findings: CBF (<30%) Volume: 76mL Perfusion (Tmax>6.0s) volume: 130mL Mismatch Volume: 152mL Infarction Location:Perfusion anomaly is centered in the left lateral frontal and parietal lobes. IMPRESSION: CT perfusion: No infarct core (CBF less than 30%) by automated RAPID processing. At risk ischemia in the left superior MCA distribution with volume 108 cc. CTA head: 1. Left MCA superior division origin occlusion with intermediate collateralization. 2. Otherwise patent circle of Willis without significant stenosis or aneurysm identified. CTA neck: 1. Patent carotid and vertebral arteries. No dissection, aneurysm, or hemodynamically significant stenosis is identified. 2. 5 mm right upper lobe  pulmonary nodule. No follow-up needed if patient is low-risk. Non-contrast chest CT can be considered in 12 months if patient is high-risk. This recommendation follows the consensus statement: Guidelines for Management of Incidental Pulmonary Nodules Detected on CT Images: From the Fleischner Society 2017; Radiology 2017; 284:228-243. These results were called by telephone at the time of interpretation on 09/05/2016 at 9:09 pm to Dr. Cheral Marker, who verbally acknowledged these results. Electronically Signed   By: Kristine Garbe M.D.   On: 09/05/2016 21:26   Ct Head Code Stroke W/o Cm  Result Date: 09/05/2016 CLINICAL DATA:  Code stroke.  Aphasia EXAM: CT HEAD WITHOUT CONTRAST TECHNIQUE: Contiguous axial images were obtained from the base of the skull through the vertex without intravenous contrast. COMPARISON:  None. FINDINGS: Brain: Generalized atrophy. Negative for hydrocephalus. Negative for acute infarct. No intracranial hemorrhage or mass. Vascular: Short segment hyperdensity left M2 segment compatible with acute thrombus. No other hyperdense vessel. Atherosclerotic disease in the cavernous carotid bilaterally. Skull: Negative Sinuses/Orbits: Negative Other: None ASPECTS ([place] Stroke Program Early CT Score) - Ganglionic level infarction (caudate, lentiform nuclei, internal capsule, insula, M1-M3 cortex): 7 - Supraganglionic infarction (M4-M6 cortex): 3 Total score (0-10 with 10 being normal): 10 IMPRESSION: 1. Negative for acute infarct or hemorrhage. Hyperdense left M2 segment compatible with acute thrombosis. 2. ASPECTS is 10 These results were called by telephone at the time of interpretation on 09/05/2016 at 8:40 pm to  Dr. Cheral Marker, who verbally acknowledged these results. Electronically Signed   By: Franchot Gallo M.D.   On: 09/05/2016 20:41    Procedures Procedures (including critical care time)  Medications Ordered in ED Medications  ceFAZolin (ANCEF) 2-4 GM/100ML-% IVPB (not  administered)   stroke: mapping our early stages of recovery book (not administered)  0.9 %  sodium chloride infusion ( Intravenous New Bag/Given 09/06/16 0042)  acetaminophen (TYLENOL) tablet 650 mg (not administered)    Or  acetaminophen (TYLENOL) solution 650 mg (not administered)    Or  acetaminophen (TYLENOL) suppository 650 mg (not administered)  senna-docusate (Senokot-S) tablet 1 tablet (not administered)  cholecalciferol (VITAMIN D) tablet 1,000 Units (not administered)  pantoprazole (PROTONIX) EC tablet 40 mg (not administered)  thiamine (VITAMIN B-1) tablet 100 mg (not administered)  ondansetron (ZOFRAN) injection 4 mg (not administered)  0.9 %  sodium chloride infusion (not administered)  clevidipine (CLEVIPREX) infusion 0.5 mg/mL (not administered)  calcium gluconate 1 g in sodium chloride 0.9 % 100 mL IVPB (1 g Intravenous New Bag/Given 09/06/16 0043)  budesonide (PULMICORT) nebulizer solution 0.5 mg (0.5 mg Nebulization Given 09/06/16 0041)  arformoterol (BROVANA) nebulizer solution 15 mcg (15 mcg Nebulization Given 09/06/16 0040)  amiodarone (NEXTERONE) 1.8 mg/mL load via infusion 150 mg (not administered)    Followed by  amiodarone (NEXTERONE PREMIX) 360-4.14 MG/200ML-% (1.8 mg/mL) IV infusion (not administered)    Followed by  amiodarone (NEXTERONE PREMIX) 360-4.14 MG/200ML-% (1.8 mg/mL) IV infusion (not administered)  propofol (DIPRIVAN) 1000 MG/100ML infusion (not administered)  fentaNYL (SUBLIMAZE) injection 100 mcg (100 mcg Intravenous Given 09/06/16 0042)  fentaNYL (SUBLIMAZE) injection 100 mcg (not administered)  iopamidol (ISOVUE-370) 76 % injection 100 mL (100 mLs Intravenous Contrast Given 09/05/16 2035)  iopamidol (ISOVUE-300) 61 % injection (96 mLs  Contrast Given 09/05/16 2332)  nitroGLYCERIN 100 MCG/ML intra-arterial injection (25 mcg  Given 09/05/16 2315)     Initial Impression / Assessment and Plan / ED Course  I have reviewed the triage vital signs and the  nursing notes.  Pertinent labs & imaging results that were available during my care of the patient were reviewed by me and considered in my medical decision making (see chart for details).     Patient with acute stroke. Not TPA candidate due to being on anticoagulation. Admit immediately by neurology myself. Initial head CT reassuring. Perfusion showed possible lesion for intervention. Admitted  CRITICAL CARE Performed by: Mackie Pai Total critical care time: 30 minutes Critical care time was exclusive of separately billable procedures and treating other patients. Critical care was necessary to treat or prevent imminent or life-threatening deterioration. Critical care was time spent personally by me on the following activities: development of treatment plan with patient and/or surrogate as well as nursing, discussions with consultants, evaluation of patient's response to treatment, examination of patient, obtaining history from patient or surrogate, ordering and performing treatments and interventions, ordering and review of laboratory studies, ordering and review of radiographic studies, pulse oximetry and re-evaluation of patient's condition.   Final Clinical Impressions(s) / ED Diagnoses   Final diagnoses:  Weakness  Cerebral infarction, unspecified mechanism [facility])    New Prescriptions Current Discharge Medication List       Davonna Belling, MD 09/06/16 [phone]

## 2016-09-06 NOTE — Progress Notes (Signed)
SLP Cancellation Note  Patient Details Name: Glenn [name] MRN: [id number] DOB: 03/10/1960   Cancelled treatment:       Reason Eval/Treat Not Completed: Medical issues which prohibited therapy (Intubated)  Gabriel Rainwater [place], CCC-SLP [phone]  [name] Meryl 09/06/2016, 7:50 AM

## 2016-09-06 NOTE — Consult Note (Signed)
[facility] PCCM Attending  I have seen and examined the patient with nurse practitioner/resident and agree with the note above.  We formulated the plan together and I elicited the following history.    Glenn [name] presented to the [facility] ED this evening with R facial droop and hemiparesis.  He had hyperdense left M2 and CTA showed a left MCA occlusion.  He underwent a left common carotid arteriogram followed by complete revascularization of occluded left MCA superior division with solitare 11mm x 63mm retriever device.  PCCM consulted for vent management.  Past Medical History:  Diagnosis Date  . CHF (congestive heart failure) ([place])   . Hepatitis C   . Hypertension    Vitals:   09/05/16 2145 09/05/16 2349 09/06/16 0012 09/06/16 0041  BP: (!) 148/88 110/67    Pulse: (!) 107     Resp: 18  16   Temp:      TempSrc:      SpO2: 93% 100% 100% 100%  Weight:   83 kg (182 lb 15.7 oz)    Vent Mode: PRVC FiO2 (%):  [100 %] 100 % Set Rate:  [16 bmp] 16 bmp Vt Set:  [550 mL] 550 mL PEEP:  [5 cmH20] 5 cmH20 Plateau Pressure:  [20 cmH20] 20 cmH20  General:  In bed on vent HENT: NCAT ETT in place PULM: CTA B, vent supported breathing CV: RRR, no mgr GI: BS+, soft, nontender MSK: normal bulk and tone Neuro: sedated on vent, some muscular twitching bilaterally  CXR images independently reviewed: clear lungs bilaterally, ETT in place  . CBC    Component Value Date/Time   WBC 5.0 09/05/2016 2020   RBC 3.57 (L) 09/05/2016 2020   HGB 12.9 (L) 09/05/2016 2028   HCT 38.0 (L) 09/05/2016 2028   PLT 188 09/05/2016 2020   MCV 96.1 09/05/2016 2020   MCH 32.8 09/05/2016 2020   MCHC 34.1 09/05/2016 2020   RDW 12.4 09/05/2016 2020   LYMPHSABS 2.1 09/05/2016 2020   MONOABS 0.5 09/05/2016 2020   EOSABS 0.8 (H) 09/05/2016 2020   BASOSABS 0.0 09/05/2016 2020    BMET    Component Value Date/Time   NA 139 09/05/2016 2028   K 4.4 09/05/2016 2028   CL 103 09/05/2016 2028   CO2 23 09/05/2016 2020   GLUCOSE 139 (H) 09/05/2016 2028   BUN 19 09/05/2016 2028   CREATININE 1.30 (H) 09/05/2016 2028   CALCIUM 9.1 09/05/2016 2020   GFRNONAA >60 09/05/2016 2020   GFRAA >60 09/05/2016 2020   Impression/Plan: Acute respiratory failure with hypoxemia: continue full vent support, VAP bundle, Daily SBT/WUA, extubate when OK from neuro standpoint, sedate now as muscle relaxants appear to be wearing off, concern for awareness given body twitching, hypertension and tachycardia; sedate NOW with propofol, fentanyl per PAD protocol.   Acute MCA stroke: continue post stroke care per neuro; orderset per neuro, imaging per neuro Hypertension: goal SBP 110-140 per Dr. Estanislado Pandy, use cardene as needed, sedate now Systolic heart failure: f/u echo, restart coreg, lisinopril on 7/19 if BP stable Afib: hold anticoagulation in setting of acute stroke, restart when OK by neuro, may need amiodarone tonight if HR doesn't improve post sedation  My cc time 40 minutes  Glenn Awkward, MD [place] PCCM Pager: [phone] Cell: [phone] After 3pm or if no response, call [phone]

## 2016-09-06 NOTE — Progress Notes (Signed)
PT Cancellation Note  Patient Details Name: [place] Wenzlick MRN: [id number] DOB: 03/10/1960   Cancelled Treatment:    Reason Eval/Treat Not Completed: Patient not medically ready. Pt currently on bedrest. Please update activity orders when pt is appropriate for PT eval. Thanks!   Thelma Comp 09/06/2016, 7:24 AM   Rolinda Roan, PT, DPT Acute Rehabilitation Services Pager: [phone]

## 2016-09-07 ENCOUNTER — Inpatient Hospital Stay (HOSPITAL_COMMUNITY): Payer: Medicaid Other

## 2016-09-07 ENCOUNTER — Encounter (HOSPITAL_COMMUNITY): Payer: Self-pay | Admitting: Interventional Radiology

## 2016-09-07 DIAGNOSIS — I481 Persistent atrial fibrillation: Secondary | ICD-10-CM

## 2016-09-07 DIAGNOSIS — I4891 Unspecified atrial fibrillation: Secondary | ICD-10-CM

## 2016-09-07 DIAGNOSIS — I4819 Other persistent atrial fibrillation: Secondary | ICD-10-CM

## 2016-09-07 DIAGNOSIS — I255 Ischemic cardiomyopathy: Secondary | ICD-10-CM

## 2016-09-07 DIAGNOSIS — I63412 Cerebral infarction due to embolism of left middle cerebral artery: Secondary | ICD-10-CM

## 2016-09-07 LAB — RENAL FUNCTION PANEL
ALBUMIN: 3.3 g/dL — AB (ref 3.5–5.0)
ANION GAP: 4 — AB (ref 5–15)
BUN: 7 mg/dL (ref 6–20)
CALCIUM: 8.6 mg/dL — AB (ref 8.9–10.3)
CO2: 24 mmol/L (ref 22–32)
Chloride: 110 mmol/L (ref 101–111)
Creatinine, Ser: 0.99 mg/dL (ref 0.61–1.24)
Glucose, Bld: 108 mg/dL — ABNORMAL HIGH (ref 65–99)
PHOSPHORUS: 2.7 mg/dL (ref 2.5–4.6)
Potassium: 3.5 mmol/L (ref 3.5–5.1)
Sodium: 138 mmol/L (ref 135–145)

## 2016-09-07 LAB — CBC
HEMATOCRIT: 31.8 % — AB (ref 39.0–52.0)
HEMOGLOBIN: 10.4 g/dL — AB (ref 13.0–17.0)
MCH: 32 pg (ref 26.0–34.0)
MCHC: 32.7 g/dL (ref 30.0–36.0)
MCV: 97.8 fL (ref 78.0–100.0)
Platelets: 144 10*3/uL — ABNORMAL LOW (ref 150–400)
RBC: 3.25 MIL/uL — AB (ref 4.22–5.81)
RDW: 12.8 % (ref 11.5–15.5)
WBC: 5.8 10*3/uL (ref 4.0–10.5)

## 2016-09-07 LAB — MAGNESIUM: MAGNESIUM: 1.7 mg/dL (ref 1.7–2.4)

## 2016-09-07 LAB — HEMOGLOBIN A1C
HEMOGLOBIN A1C: 4.7 % — AB (ref 4.8–5.6)
Mean Plasma Glucose: 88 mg/dL

## 2016-09-07 LAB — PARATHYROID HORMONE, INTACT (NO CA): PTH: 43 pg/mL (ref 15–65)

## 2016-09-07 LAB — CALCIUM, IONIZED: Calcium, Ionized, Serum: 4.8 mg/dL (ref 4.5–5.6)

## 2016-09-07 LAB — VITAMIN D 25 HYDROXY (VIT D DEFICIENCY, FRACTURES): Vit D, 25-Hydroxy: 33.1 ng/mL (ref 30.0–100.0)

## 2016-09-07 MED ORDER — ISOSORBIDE MONONITRATE 10 MG PO TABS
10.0000 mg | ORAL_TABLET | Freq: Every day | ORAL | Status: DC
  Administered 2016-09-07 – 2016-09-08 (×2): 10 mg via ORAL
  Filled 2016-09-07 (×2): qty 1

## 2016-09-07 MED ORDER — APIXABAN 5 MG PO TABS
5.0000 mg | ORAL_TABLET | Freq: Two times a day (BID) | ORAL | Status: DC
  Administered 2016-09-07 – 2016-09-08 (×3): 5 mg via ORAL
  Filled 2016-09-07 (×3): qty 1

## 2016-09-07 MED ORDER — PNEUMOCOCCAL VAC POLYVALENT 25 MCG/0.5ML IJ INJ
0.5000 mL | INJECTION | INTRAMUSCULAR | Status: AC
  Administered 2016-09-08: 0.5 mL via INTRAMUSCULAR
  Filled 2016-09-07: qty 0.5

## 2016-09-07 MED ORDER — HYDRALAZINE HCL 25 MG PO TABS
25.0000 mg | ORAL_TABLET | Freq: Two times a day (BID) | ORAL | Status: DC
  Administered 2016-09-07 – 2016-09-08 (×3): 25 mg via ORAL
  Filled 2016-09-07 (×3): qty 1

## 2016-09-07 MED ORDER — ORAL CARE MOUTH RINSE
15.0000 mL | Freq: Two times a day (BID) | OROMUCOSAL | Status: DC
  Administered 2016-09-07: 15 mL via OROMUCOSAL

## 2016-09-07 MED ORDER — ASPIRIN EC 81 MG PO TBEC
81.0000 mg | DELAYED_RELEASE_TABLET | Freq: Every day | ORAL | Status: DC
  Administered 2016-09-08: 81 mg via ORAL
  Filled 2016-09-07: qty 1

## 2016-09-07 MED ORDER — BUSPIRONE HCL 15 MG PO TABS
7.5000 mg | ORAL_TABLET | Freq: Every day | ORAL | Status: DC
  Administered 2016-09-07 – 2016-09-08 (×2): 7.5 mg via ORAL
  Filled 2016-09-07 (×2): qty 1

## 2016-09-07 MED ORDER — FUROSEMIDE 20 MG PO TABS
20.0000 mg | ORAL_TABLET | Freq: Every day | ORAL | Status: DC
  Administered 2016-09-07 – 2016-09-08 (×2): 20 mg via ORAL
  Filled 2016-09-07 (×2): qty 1

## 2016-09-07 MED ORDER — FUROSEMIDE 10 MG/ML IJ SOLN
40.0000 mg | Freq: Once | INTRAMUSCULAR | Status: AC
  Administered 2016-09-07: 40 mg via INTRAVENOUS
  Filled 2016-09-07: qty 4

## 2016-09-07 MED ORDER — CARVEDILOL 12.5 MG PO TABS
25.0000 mg | ORAL_TABLET | Freq: Two times a day (BID) | ORAL | Status: DC
  Administered 2016-09-07 – 2016-09-08 (×2): 25 mg via ORAL
  Filled 2016-09-07 (×2): qty 2

## 2016-09-07 MED ORDER — LISINOPRIL 5 MG PO TABS
5.0000 mg | ORAL_TABLET | Freq: Every day | ORAL | Status: DC
  Administered 2016-09-07 – 2016-09-08 (×2): 5 mg via ORAL
  Filled 2016-09-07 (×2): qty 1

## 2016-09-07 MED ORDER — CHLORHEXIDINE GLUCONATE 0.12 % MT SOLN
15.0000 mL | Freq: Two times a day (BID) | OROMUCOSAL | Status: DC
  Administered 2016-09-07: 15 mL via OROMUCOSAL
  Filled 2016-09-07: qty 15

## 2016-09-07 MED ORDER — ORAL CARE MOUTH RINSE: 15.0000 mL | Freq: Two times a day (BID) | OROMUCOSAL | Status: DC

## 2016-09-07 NOTE — Progress Notes (Signed)
eLink Physician-Brief Progress Note Patient Name: Glenn [name] DOB: 05-02-1950 MRN: [id number]   Date of Service  09/07/2016  HPI/Events of Note  Increasing SOB on exertion - Hx of systolic HF with LVEF = 85-99%.   eICU Interventions  Will order: 1. Lasix 40 mg IV now.  2. Will put the patient back on the PCCM list.      Intervention Category Major Interventions: Other:  Sommer,Steven Cornelia Copa 09/07/2016, 7:59 PM

## 2016-09-07 NOTE — Progress Notes (Signed)
STROKE TEAM PROGRESS NOTE   SUBJECTIVE (INTERVAL HISTORY) His nurse is at the bedside. Pt was extubated yesterday and tolerating well. No focal neuro deficit at this time. Had CT head did not show acute abnormalities. Pt stated that he is on ASA and Xarelto and is compliant with Xarelto. Discussed about continue Xarelto or change to eliquis. Pt would like to change to eliquis and follow up with cardiology. He just moved from [place] and has appointment with Cardiology here soon.    OBJECTIVE Temp:  [97.6 F (36.4 C)-98.8 F (37.1 C)] 98.1 F (36.7 C) (07/20 2101) Pulse Rate:  [38-179] 77 (07/20 1900) Cardiac Rhythm: Atrial fibrillation (07/20 0800) Resp:  [13-32] 28 (07/20 1900) BP: (108-170)/(60-103) 149/80 (07/20 1900) SpO2:  [82 %-99 %] 99 % (07/20 1947) Arterial Line BP: (145-151)/(52-61) 146/60 (07/20 0400) FiO2 (%):  [40 %] 40 % (07/20 0828)  CBC:  Recent Labs Lab 09/05/16 2020  09/06/16 0500 09/07/16 0401  WBC 5.0  --  4.4 5.8  NEUTROABS 1.5*  --  2.9  --   HGB 11.7*  < > 10.5* 10.4*  HCT 34.3*  < > 30.5* 31.8*  MCV 96.1  --  96.5 97.8  PLT 188  --  157 144*  < > = values in this interval not displayed.  Basic Metabolic Panel:   Recent Labs Lab 09/06/16 0500 09/06/16 1130 09/07/16 0401  NA 137  --  138  K 3.9  --  3.5  CL 107  --  110  CO2 23  --  24  GLUCOSE 115*  --  108*  BUN 13  --  7  CREATININE 1.11  --  0.99  CALCIUM 8.3*  --  8.6*  MG  --  1.6* 1.7  PHOS  --  3.6 2.7    Lipid Panel:     Component Value Date/Time   CHOL 96 09/06/2016 0500   TRIG 128 09/06/2016 0500   HDL 26 (L) 09/06/2016 0500   CHOLHDL 3.7 09/06/2016 0500   VLDL 26 09/06/2016 0500   LDLCALC 44 09/06/2016 0500   HgbA1c:  Lab Results  Component Value Date   HGBA1C 4.7 (L) 09/06/2016   Urine Drug Screen: No results found for: LABOPIA, COCAINSCRNUR, LABBENZ, AMPHETMU, THCU, LABBARB  Alcohol Level     Component Value Date/Time   ETH 8 (H) 09/05/2016 2020    IMAGING I  have personally reviewed the radiological images below and agree with the radiology interpretations.  Ct Angio Head W Or Wo Contrast Ct Angio Neck W And/or Wo Contrast 09/05/2016 IMPRESSION: CTA head: 1. Left MCA superior division origin occlusion with intermediate collateralization. 2. Otherwise patent circle of Willis without significant stenosis or aneurysm identified.  CTA neck: 1. Patent carotid and vertebral arteries. No dissection, aneurysm, or hemodynamically significant stenosis is identified. 2. 5 mm right upper lobe pulmonary nodule.  Ct Head Wo Contrast 09/06/2016 IMPRESSION: No hemorrhage or other acute abnormality following neurovascular intervention. Gray-white differentiation is preserved in the region of ischemia demonstrated on the earlier perfusion study.  Ct Cerebral Perfusion W Contrast 09/05/2016 IMPRESSION: CT perfusion: No infarct core (CBF less than 30%) by automated RAPID processing. At risk ischemia in the left superior MCA distribution with volume 108 cc. CTA head: 1. Left MCA superior division origin occlusion with intermediate collateralization. 2. Otherwise patent circle of Willis without significant stenosis or aneurysm identified. CTA neck: 1. Patent carotid and vertebral arteries. No dissection, aneurysm, or hemodynamically significant stenosis is identified. 2. 5  mm right upper lobe pulmonary nodule.  Ct Head Code Stroke W/o Cm 09/05/2016 IMPRESSION: 1. Negative for acute infarct or hemorrhage. Hyperdense left M2 segment compatible with acute thrombosis. 2. ASPECTS is 10   DSA - complete revascularization of occluded Lt MCA superior division with x 1 pass with solitaire 68mm x 40 mm retriever  device  achieving a TICI 3 reperfusion.  CUS - Findings are consistent with a 1-39 percent stenosis involving the right internal carotid artery and the left internal carotid artery. The vertebral arteries demonstrate antegrade flow.  TTE - Left ventricle: The cavity size  was normal. Systolic function was   moderately to severely reduced. The estimated ejection fraction   was in the range of 30% to 35%. Diffuse hypokinesis. Moderate   hypokinesis of the anteroseptal, inferior, and inferoseptal   myocardium. - Aortic valve: Transvalvular velocity was within the normal range.   There was no stenosis. There was moderate regurgitation. Valve   area (VTI): 2.25 cm^2. Valve area (Vmax): 2.37 cm^2. Valve area   (Vmean): 2.34 cm^2. - Mitral valve: Transvalvular velocity was within the normal range.   There was no evidence for stenosis. There was mild regurgitation. - Left atrium: The atrium was moderately dilated. - Right ventricle: The cavity size was normal. Wall thickness was   normal. Systolic function was mildly reduced. - Right atrium: The atrium was moderately dilated. - Tricuspid valve: There was mild-moderate regurgitation. - Pulmonary arteries: Systolic pressure was mildly increased. PA   peak pressure: 38 mm Hg (S).    PHYSICAL EXAM  Temp:  [97.6 F (36.4 C)-98.8 F (37.1 C)] 98.1 F (36.7 C) (07/20 2101) Pulse Rate:  [38-179] 77 (07/20 1900) Resp:  [13-32] 28 (07/20 1900) BP: (108-170)/(60-103) 149/80 (07/20 1900) SpO2:  [82 %-99 %] 99 % (07/20 1947) Arterial Line BP: (145-151)/(52-61) 146/60 (07/20 0400) FiO2 (%):  [40 %] 40 % (07/20 0828)   Temp:  [97.6 F (36.4 C)-98.8 F (37.1 C)] 98.1 F (36.7 C) (07/20 2101) Pulse Rate:  [38-179] 86 (07/20 2200) Resp:  [13-32] 20 (07/20 2200) BP: (108-170)/(60-103) 161/75 (07/20 2200) SpO2:  [82 %-99 %] 92 % (07/20 2200) Arterial Line BP: (145-151)/(52-61) 146/60 (07/20 0400) FiO2 (%):  [40 %] 40 % (07/20 0828)  General - Well nourished, well developed, in no apparent distress.  Ophthalmologic - Sharp disc margins OU.   Cardiovascular - irregularly irregular heart rate and rhythm.  Mental Status -  Level of arousal and orientation to time, place, and person were intact. Language  including expression, naming, repetition, comprehension was assessed and found intact. Fund of Knowledge was assessed and was intact  Cranial Nerves II - XII - II - Visual field intact OU. III, IV, VI - Extraocular movements intact. V - Facial sensation intact bilaterally. VII - Facial movement intact bilaterally. VIII - Hearing & vestibular intact bilaterally. X - Palate elevates symmetrically. XI - Chin turning & shoulder shrug intact bilaterally XII - Tongue protrusion intact.  Motor Strength - The patient's strength was normal in all extremities and pronator drift was absent.  Bulk was normal and fasciculations were absent.   Motor Tone - Muscle tone was assessed at the neck and appendages and was normal.  Reflexes - The patient's reflexes were 1+ in all extremities and he had no pathological reflexes.  Sensory - Light touch, temperature/pinprick were assessed and were symmetrical.    Coordination - The patient had normal movements in the hands with no ataxia or dysmetria.  Tremor  was absent.  Gait and Station - deferred.    ASSESSMENT/PLAN [name] is a 66 y.o. male with history of atrial fibrillation on Xarelto with implanted defibrillator, congestive heart failure, hepatitis C, and hypertension presenting with inability to speak, with right upper extremity weakness, and right facial droop. He did not receive IV t-PA due to arriving outside of the treatment window and on Xarelto. S/p mechanical thrombectomy for left M2 occlusion.   Stroke: acute left M2 infarct s/p IR with TICI3 recannulization, stroke likely due to atrial fibrillation and low EF although on Xarelto and ASA.    Resultant  Neuro deficit resolved  CT head: acute L M2 hyperdense with thrombus   MRI / MRA head: not able to obtain (implanted defibrillator)  CTA head/neck: L M2 occlusion with intermediate collateralization  DSA - left M2 occlusion s/p TICI3 recannulization  Repeat CT head no  acute abnormalities  2D Echo: EF 30-35%  LDL 44  HgbA1c 4.7  SCDs for VTE prophylaxis Diet Heart Room service appropriate? Yes; Fluid consistency: Thin  aspirin 81 mg daily and Xarelto (rivaroxaban) daily prior to admission, now on ASA. Discussed with options about AC and pt would like to switch to Eliquis. Continue ASA 81  Ongoing aggressive stroke risk factor management  Therapy recommendations:  pending  Disposition:  Pending  afib on Xarelto  Pt stated compliance with medication  Rate controlled  CT repeat no acute abnormalities  Discussed with options about AC and pt would like to switch to Eliquis. Continue ASA 81  Cardiomyopathy  EF 30-35%  Pt cardiac record in [place] not available at this time  Pt has cardiology appointment here in [facility] on 10/12/16  Resume home meds including coreg, low dose lisinopril, isosorbide, hydralazine, and lasix  Hypertension  stable  Still on clevidipine  Wean off as able  SBP goal now < 180/105  Long-term BP goal normotensive  Tobacco abuse  Current smoker  Smoking cessation counseling will be provided  Other Stroke Risk Factors  ETOH use, advised to drink no more than 2 drink(s) a day  CHF  Other Active Problems     Hospital day # 2  This patient is critically ill due to left MCA infarct with M2 occlusion s/p IR, afib, CHF and at significant risk of neurological worsening, death form recurrent stroke, hemorrhagic conversion, heart failure, cerebral edema. This patient's care requires constant monitoring of vital signs, hemodynamics, respiratory and cardiac monitoring, review of multiple databases, neurological assessment, discussion with family, other specialists and medical decision making of high complexity. I spent 35 minutes of neurocritical care time in the care of this patient.  Rosalin Hawking, MD PhD Stroke Neurology 09/07/2016 10:06 PM   To contact Stroke Continuity provider, please refer to  [email or web address] After hours, contact General Neurology

## 2016-09-07 NOTE — Evaluation (Signed)
Speech Language Pathology Evaluation Patient Details Name: Glenn [name] MRN: [id number] DOB: [birth year] Today's Date: 09/07/2016 Time: 1100-1111 SLP Time Calculation (min) (ACUTE ONLY): 11 min  Problem List:  Patient Active Problem List   Diagnosis Date Noted  . Cerebral infarction ([place])   . Stroke (cerebrum) ([place]) 09/05/2016  . CHF (congestive heart failure) ([place]) 08/14/2016  . HTN (hypertension) 08/14/2016  . Hepatitis C 08/14/2016   Past Medical History:  Past Medical History:  Diagnosis Date  . CHF (congestive heart failure) ([place])   . Hepatitis C   . Hypertension    Past Surgical History:  Past Surgical History:  Procedure Laterality Date  . CARDIAC DEFIBRILLATOR PLACEMENT  2015  . EYE SURGERY Left 1990  . IR PERCUTANEOUS ART THROMBECTOMY/INFUSION INTRACRANIAL INC DIAG ANGIO  09/05/2016  . RADIOLOGY WITH ANESTHESIA N/A 09/05/2016   Procedure: RADIOLOGY WITH ANESTHESIA;  Surgeon: Luanne Bras, MD;  Location: [facility];  Service: Radiology;  Laterality: N/A;   HPI:  Pt is a 66 y.o.maleadmitted 7/18 with R hemiparesis and aphasia, found to have left MCA occlusion now s/p thrombectomy. He remained intubated 7/18-7/19. PMH includes chronic Afib on xarelto, CHF, Hepatitis C, and HTN   Assessment / Plan / Recommendation Clinical Impression  Pt appears to be at his cognitive-linguistic baseline with no acute SLP needs identified. He lives with his son and has intermittent assistance available to him upon initial return home. No further SLP f/u indicated.    SLP Assessment  SLP Recommendation/Assessment: Patient does not need any further Speech Lanaguage Pathology Services SLP Visit Diagnosis: Cognitive communication deficit (R41.841)    Follow Up Recommendations  Other (comment) (intermittent supervision upon initial return home)    Frequency and Duration min 1 x/week         SLP Evaluation Cognition  Overall Cognitive Status: Within Functional Limits for tasks  assessed Orientation Level: Oriented X4       Comprehension  Auditory Comprehension Overall Auditory Comprehension: Appears within functional limits for tasks assessed    Expression Expression Primary Mode of Expression: Verbal Verbal Expression Overall Verbal Expression: Appears within functional limits for tasks assessed   Oral / Motor  Oral Motor/Sensory Function Overall Oral Motor/Sensory Function: Within functional limits Motor Speech Overall Motor Speech: Appears within functional limits for tasks assessed   GO                    Germain Osgood 09/07/2016, 11:25 AM  Germain Osgood, M.A. CCC-SLP [phone]

## 2016-09-07 NOTE — Progress Notes (Signed)
**  Preliminary report by tech**  Carotid artery duplex complete. Findings are consistent with a 1-39 percent stenosis involving the right internal carotid artery and the left internal carotid artery. The vertebral arteries demonstrate antegrade flow.  09/07/16 12:52 PM Glenn [name] RVT

## 2016-09-07 NOTE — Progress Notes (Signed)
PT Cancellation Note  Patient Details Name: Glenn [name] MRN: [id number] DOB: 03/10/1960   Cancelled Treatment:    Reason Eval/Treat Not Completed: Patient not medically ready. Pt with active bedrest orders. Please update activity orders to initiate PT. If bedrest orders are DC and pt needs to be seen today, please page number below. Thanks   Thelma Comp 09/07/2016, 6:59 AM   Rolinda Roan, PT, DPT Acute Rehabilitation Services Pager: [phone]

## 2016-09-07 NOTE — Progress Notes (Signed)
PULMONARY / CRITICAL CARE MEDICINE   Name: Glenn [name] MRN: [id number] DOB: 03/10/1960    ADMISSION DATE:  09/05/2016 CONSULTATION DATE:  09/05/16  REFERRING MD:  Cheral Marker  CHIEF COMPLAINT:  AMS  Brief patient summary:   Glenn [name] is a 66 y.o. male with PMH of chronic Afib on xarelto, CHF, Hepatitis C, and HTN. He presented to [facility] ED 7/18 as code stroke.  On arrival to ED, he was mute with right hemiparesis and right facial droop.  CT demonstrated hyperdense left M2 segment and CTA demonstrated left MCA occlusion.  He was subsequently taken to IR where he had left common carotid arteriogram followed by complete revascularization of occluded left MCA.  He then returned to the ICU on the ventilator and PCCM was asked to assist with vent management.  SUBJECTIVE:  Extubated last night remains on cl;eviprex   VITAL SIGNS: BP 136/83   Pulse 79   Temp 98 F (36.7 C) (Oral)   Resp 19   Ht 5\' 7"  (1.702 m)   Wt 83 kg (182 lb 15.7 oz) Comment: From office visit notes 07/2016  SpO2 91%   BMI 28.66 kg/m   HEMODYNAMICS:   VENTILATOR SETTINGS: Vent Mode: PSV FiO2 (%):  [40 %] 40 % Set Rate:  [18 bmp] 18 bmp Vt Set:  [530 mL] 530 mL PEEP:  [5 cmH20] 5 cmH20 Pressure Support:  [5 cmH20-10 cmH20] 10 cmH20 Plateau Pressure:  [7 cmH20-10 cmH20] 7 cmH20  INTAKE / OUTPUT: I/O last 3 completed shifts: In: 3829.7 [I.V.:3719.7; IV Piggyback:110] Out: 2345 [Urine:2325; Blood:20]   PHYSICAL EXAMINATION: General:  Comfortable, awake, HEENT: Oropharynx clear, pupils equal and reactive Neuro: Awake, follows commands, well oriented, able to move all extremities with good strength CV: Irregular, no murmur PULM: Clear bilaterally GI: Benign, positive bowel sounds Extremities: 1+ lower extremity edema, no evidence groin hematoma Skin: No rash  LABS:  BMET  Recent Labs Lab 09/05/16 2020 09/05/16 2028 09/06/16 0500 09/07/16 0401  NA 135 139 137 138  K 4.1 4.4 3.9 3.5   CL 104 103 107 110  CO2 23  --  23 24  BUN 15 19 13 7   CREATININE 1.31* 1.30* 1.11 0.99  GLUCOSE 134* 139* 115* 108*    Electrolytes  Recent Labs Lab 09/05/16 2020 09/06/16 0500 09/06/16 1130 09/07/16 0401  CALCIUM 9.1 8.3*  --  8.6*  MG  --   --  1.6* 1.7  PHOS  --   --  3.6 2.7    CBC  Recent Labs Lab 09/05/16 2020 09/05/16 2028 09/06/16 0500 09/07/16 0401  WBC 5.0  --  4.4 5.8  HGB 11.7* 12.9* 10.5* 10.4*  HCT 34.3* 38.0* 30.5* 31.8*  PLT 188  --  157 144*    Coag's  Recent Labs Lab 09/05/16 2020  APTT 33  INR 1.30    Sepsis Markers No results for input(s): LATICACIDVEN, PROCALCITON, O2SATVEN in the last 168 hours.  ABG  Recent Labs Lab 09/06/16 0049  PHART 7.270*  PCO2ART 52.1*  PO2ART 239.0*    Liver Enzymes  Recent Labs Lab 09/05/16 2020 09/07/16 0401  AST 43*  --   ALT 33  --   ALKPHOS 72  --   BILITOT 0.9  --   ALBUMIN 4.0 3.3*    Cardiac Enzymes No results for input(s): TROPONINI, PROBNP in the last 168 hours.  Glucose  Recent Labs Lab 09/05/16 2019  GLUCAP 135*    Imaging Ct Head Wo Contrast  Result  Date: 09/07/2016 CLINICAL DATA:  Stroke follow-up EXAM: CT HEAD WITHOUT CONTRAST TECHNIQUE: Contiguous axial images were obtained from the base of the skull through the vertex without intravenous contrast. COMPARISON:  Head CT 09/05/2016 FINDINGS: Brain: No mass lesion, intraparenchymal hemorrhage or extra-axial collection. No evidence of acute cortical infarct. Brain parenchyma and CSF-containing spaces are normal for age. Vascular: No hyperdense vessel or unexpected calcification. Skull: Normal visualized skull base, calvarium and extracranial soft tissues. Sinuses/Orbits: No sinus fluid levels or advanced mucosal thickening. No mastoid effusion. Normal orbits. IMPRESSION: Unchanged examination without acute intracranial abnormality. Gray-white differentiation remains distinct. Electronically Signed   By: Ulyses Jarred M.D.    On: 09/07/2016 05:23     STUDIES:  CT head 7/18 > hyperdense left M2 segment. CTA head 7/18 > let MCA occlusion. CT Head 7/20 >> no evidence of evolving CVA  CULTURES: None.  ANTIBIOTICS: None.  SIGNIFICANT EVENTS: 7/18 > admit.  LINES/TUBES: ETT 7/18 > 7/19  DISCUSSION: 66 y.o. male admitted 7/18 with left MCA occlusion.  Taken to IR for revascularization then returned to ICU on vent. Extubated successfully 7/19  ASSESSMENT / PLAN:  NEUROLOGIC A:   Acute encephalopathy - due to sedation. Left MCA occlusion - s/p IR revascularization 7/18. P:   Stroke workup / management per neuro. Holding preadmission buspirone, restart when okay with neurology  PULMONARY A: Respiratory insufficiency - due to inability to extubate post IR procedure. Tobacco abuse -unclear why he takes Advair, no documentation of lung disease P:   Push pulmonary hygiene He will need a more formal evaluation for obstructive lung disease once stabilized from this hospitalization Continue scheduled Brovana and budesonide for now; he is on Advair at home which he takes only as needed Smoking cessation counseling  CARDIOVASCULAR A:  A.fib with RVR - rate controlled after sedation Hx CHF (no echo on file), A.fib (on xarelto) - rate controlled after sedated, amio not started P:  ICU monitoring  Goal SBP 110-140 per Deveshwar Remains on cleviprex, wean as able per Neuro BP recs Can probably restart lisinopril given improvement in his renal function Holding preadmission coreg, furosemide, hydralazine, rivaroxaban  RENAL A:   AKI - unknown baseline. Improved Hypocalcemia- ? R/t vitamin D deficiency P:   Continue vitamin D Follow BMP, urine output Replace electrolytes as indicated  GASTROINTESTINAL A:   GI prophylaxis. Nutrition. Hx HCV. P:   SUP: Pantoprazole. By mouth diet  HEMATOLOGIC A:   Anemia VTE Prophylaxis. P:  SCD in place Follow CBC  INFECTIOUS A:   No indication of  infection. P:   Follow clinically  ENDOCRINE A:   No acute issues.   P:   No intervention  Family updated: No family at bedside.   Interdisciplinary Family Meeting v Palliative Care Meeting:  Due by: 09/12/16.  CC time: 40 min.  PCCM will sign off. Please call for can assist  Baltazar Apo, MD, PhD 09/07/2016, 9:15 AM [facility] [phone] or if no answer [phone]

## 2016-09-07 NOTE — Evaluation (Signed)
Clinical/Bedside Swallow Evaluation Patient Details  Name: Glenn [name] MRN: [id number] Date of Birth: September 09, 1950  Today's Date: 09/07/2016 Time: SLP Start Time (ACUTE ONLY): 31 SLP Stop Time (ACUTE ONLY): 1100 SLP Time Calculation (min) (ACUTE ONLY): 8 min  Past Medical History:  Past Medical History:  Diagnosis Date  . CHF (congestive heart failure) ([place])   . Hepatitis C   . Hypertension    Past Surgical History:  Past Surgical History:  Procedure Laterality Date  . CARDIAC DEFIBRILLATOR PLACEMENT  2015  . EYE SURGERY Left 1990  . IR PERCUTANEOUS ART THROMBECTOMY/INFUSION INTRACRANIAL INC DIAG ANGIO  09/05/2016  . RADIOLOGY WITH ANESTHESIA N/A 09/05/2016   Procedure: RADIOLOGY WITH ANESTHESIA;  Surgeon: Luanne Bras, MD;  Location: [facility];  Service: Radiology;  Laterality: N/A;   HPI:  Pt is a 66 y.o.maleadmitted 7/18 with R hemiparesis and aphasia, found to have left MCA occlusion now s/p thrombectomy. He remained intubated 7/18-7/19. PMH includes chronic Afib on xarelto, CHF, Hepatitis C, and HTN   Assessment / Plan / Recommendation Clinical Impression  Pt's voice and cough are strong, and he does not show signs of facial weakness. He consumed various POs with one dry, delayed cough following several bites of applesauce. Otherwise, no overt signs of dysphagia or aspiration were observed. Recommend initiation of regular textures and thin liquids. SLP will f/u briefly to ensure tolerance given acute CVA and brief intubation. SLP Visit Diagnosis: Dysphagia, unspecified (R13.10)    Aspiration Risk  Mild aspiration risk    Diet Recommendation Regular;Thin liquid   Liquid Administration via: Cup;Straw Medication Administration: Whole meds with liquid Supervision: Patient able to self feed;Intermittent supervision to cue for compensatory strategies Compensations: Slow rate;Small sips/bites Postural Changes: Seated upright at 90 degrees    Other  Recommendations Oral  Care Recommendations: Oral care BID   Follow up Recommendations Other (comment) (intermittent supervision)      Frequency and Duration min 1 x/week  1 week       Prognosis        Swallow Study   General HPI: Pt is a 66 y.o.maleadmitted 7/18 with R hemiparesis and aphasia, found to have left MCA occlusion now s/p thrombectomy. He remained intubated 7/18-7/19. PMH includes chronic Afib on xarelto, CHF, Hepatitis C, and HTN Type of Study: Bedside Swallow Evaluation Previous Swallow Assessment: none in chart Diet Prior to this Study: NPO Temperature Spikes Noted: Yes (100.5) Respiratory Status: Nasal cannula History of Recent Intubation: Yes Length of Intubations (days): 1 days Date extubated: 09/06/16 Behavior/Cognition: Alert;Cooperative;Pleasant mood Oral Cavity Assessment: Within Functional Limits Oral Care Completed by SLP: No Oral Cavity - Dentition: Adequate natural dentition Vision: Functional for self-feeding Self-Feeding Abilities: Able to feed self Patient Positioning: Upright in bed Baseline Vocal Quality: Normal Volitional Cough: Strong Volitional Swallow: Able to elicit    Oral/Motor/Sensory Function Overall Oral Motor/Sensory Function: Within functional limits   Ice Chips Ice chips: Not tested   Thin Liquid Thin Liquid: Within functional limits Presentation: Cup;Self Fed;Straw    Nectar Thick Nectar Thick Liquid: Not tested   Honey Thick Honey Thick Liquid: Not tested   Puree Puree: Impaired Presentation: Self Fed;Spoon Pharyngeal Phase Impairments: Cough - Delayed (x1)   Solid   GO   Solid: Within functional limits Presentation: Self [place] 09/07/2016,11:23 AM   Germain Osgood, M.A. CCC-SLP [phone]

## 2016-09-07 NOTE — Progress Notes (Signed)
OT Cancellation Note  Patient Details Name: Glenn [name] MRN: [id number] DOB: 03/10/1960   Cancelled Treatment:    Reason Eval/Treat Not Completed: Other (comment). Pt with active bedrest orders. Please update activity orders to initiate OT. If bedrest orders are DC and pt needs to be seen today, please page number below. Thanks  [place], OT/L  [phone] 09/07/2016 09/07/2016, 6:53 AM

## 2016-09-07 NOTE — Progress Notes (Signed)
Patient ID: Glenn [name], male   DOB: 03/10/1960, 65 y.o.   MRN: [id number]    Referring Physician(s): Dr. Kerney Elbe  Supervising Physician: Luanne Bras  Patient Status: [facility] - In-pt  Chief Complaint: CVA  Subjective: Extubated, talkative.  Ready for swallow trials.   Allergies: Patient has no known allergies.  Medications: Prior to Admission medications   Medication Sig Start Date End Date Taking? Authorizing Provider  aspirin EC 81 MG tablet Take 81 mg by mouth daily.    [provider]  busPIRone (BUSPAR) 7.5 MG tablet Take 7.5 mg by mouth daily.    [provider]  carvedilol (COREG) 25 MG tablet Take 1.5 tablets (37.5 mg total) by mouth 2 (two) times daily with a meal. 08/14/16   Bacigalupo, Dionne Bucy, MD  Cholecalciferol (VITAMIN D3) 1000 units CAPS Take 1,000 Units by mouth daily.    [provider]  Fluticasone-Salmeterol (ADVAIR) 250-50 MCG/DOSE AEPB Inhale 1 puff into the lungs 2 (two) times daily.    [provider]  furosemide (LASIX) 20 MG tablet Take 1 tablet (20 mg total) by mouth daily. 08/14/16   Virginia Crews, MD  hydrALAZINE (APRESOLINE) 25 MG tablet Take 25 mg by mouth 2 (two) times daily.    [provider]  isosorbide mononitrate (ISMO,MONOKET) 10 MG tablet Take 10 mg by mouth daily.    [provider]  lisinopril (PRINIVIL,ZESTRIL) 5 MG tablet Take 5 mg by mouth daily.    [provider]  omeprazole (PRILOSEC) 20 MG capsule Take 20 mg by mouth daily.    [provider]  rivaroxaban (XARELTO) 20 MG TABS tablet Take 20 mg by mouth daily with supper.    [provider]  thiamine (VITAMIN B-1) 100 MG tablet Take 100 mg by mouth daily.    [provider]    Vital Signs: BP 128/88 (BP Location: Left Arm)   Pulse (!) 110   Temp 98 F (36.7 C) (Oral)   Resp 16   Ht 5\' 7"  (1.702 m)   Wt 182 lb 15.7 oz (83 kg) Comment: From office visit notes 07/2016  SpO2  (!) 82%   BMI 28.66 kg/m   Physical Exam: Neuro:Marland Kitchen  Moves all 4 extremities, no focal deficits Chest clear to auscultation Heart: irregular rate, irregular rhythm Skin: R groin soft, intact.  No evidence of hematoma or pseudoaneurysm.  Imaging: Ct Angio Head W Or Wo Contrast  Result Date: 09/05/2016 EXAM: CT ANGIOGRAPHY HEAD AND NECK CT PERFUSION BRAIN TECHNIQUE: Multidetector CT imaging of the head and neck was performed using the standard protocol during bolus administration of intravenous contrast. Multiplanar CT image reconstructions and MIPs were obtained to evaluate the vascular anatomy. Carotid stenosis measurements (when applicable) are obtained utilizing NASCET criteria, using the distal internal carotid diameter as the denominator. Multiphase CT imaging of the brain was performed following IV bolus contrast injection. Subsequent parametric perfusion maps were calculated using RAPID software. COMPARISON:  None. FINDINGS: CTA NECK FINDINGS Aortic arch: Bovine arch. Imaged portion shows no evidence of aneurysm or dissection. No significant stenosis of the major arch vessel origins. Minimal calcific atherosclerosis of the aortic arch. Right carotid system: No evidence of dissection, stenosis (50% or greater) or occlusion. Moderate calcified plaque of the carotid bifurcation with minimal less than 30% proximal ICA stenosis. Left carotid system: No evidence of dissection, stenosis (50% or greater) or occlusion. Mild calcified plaque of the bifurcation. Vertebral arteries: Codominant. No evidence of dissection, stenosis (50% or greater)  or occlusion. Skeleton: Moderate cervical degenerative changes with upper cervical facet arthrosis greater on the right and discogenic degenerative disease greatest at the C4 through C7 levels. No high-grade bony canal stenosis. Other neck: Negative. Upper chest: 5 mm nodule in the right upper lobe (series 8, image 347). Review of the MIP images confirms the above  findings CTA HEAD FINDINGS Anterior circulation: Left MCA superior division origin occlusion with intermediate collateralization. The point of occlusion is in the left anterior sylvian fissure corresponding to the hyperdensity on prior noncontrast CT of the head (series 10, image 147) and at the branching origin of the inferior division which makes an approximately 90 degrees angle downward turn relative to the M1 (series 13, image 31). Otherwise the anterior circulation is patent without high-grade stenosis, aneurysm, or occlusion. Calcified plaque of the carotid siphons with mild right-sided distal cavernous stenosis. Posterior circulation: No significant stenosis, proximal occlusion, aneurysm, or vascular malformation. Venous sinuses: As permitted by contrast timing, patent. Anatomic variants: Right larger than left posterior communicating artery is and diminutive patent anterior communicating artery. Review of the MIP images confirms the above findings CT Brain Perfusion Findings: CBF (<30%) Volume: 58mL Perfusion (Tmax>6.0s) volume: 180mL Mismatch Volume: 134mL Infarction Location:Perfusion anomaly is centered in the left lateral frontal and parietal lobes. IMPRESSION: CT perfusion: No infarct core (CBF less than 30%) by automated RAPID processing. At risk ischemia in the left superior MCA distribution with volume 108 cc. CTA head: 1. Left MCA superior division origin occlusion with intermediate collateralization. 2. Otherwise patent circle of Willis without significant stenosis or aneurysm identified. CTA neck: 1. Patent carotid and vertebral arteries. No dissection, aneurysm, or hemodynamically significant stenosis is identified. 2. 5 mm right upper lobe pulmonary nodule. No follow-up needed if patient is low-risk. Non-contrast chest CT can be considered in 12 months if patient is high-risk. This recommendation follows the consensus statement: Guidelines for Management of Incidental Pulmonary Nodules Detected  on CT Images: From the Fleischner Society 2017; Radiology 2017; 284:228-243. These results were called by telephone at the time of interpretation on 09/05/2016 at 9:09 pm to Dr. Cheral Marker, who verbally acknowledged these results. Electronically Signed   By: Kristine Garbe M.D.   On: 09/05/2016 21:26   Ct Head Wo Contrast  Result Date: 09/07/2016 CLINICAL DATA:  Stroke follow-up EXAM: CT HEAD WITHOUT CONTRAST TECHNIQUE: Contiguous axial images were obtained from the base of the skull through the vertex without intravenous contrast. COMPARISON:  Head CT 09/05/2016 FINDINGS: Brain: No mass lesion, intraparenchymal hemorrhage or extra-axial collection. No evidence of acute cortical infarct. Brain parenchyma and CSF-containing spaces are normal for age. Vascular: No hyperdense vessel or unexpected calcification. Skull: Normal visualized skull base, calvarium and extracranial soft tissues. Sinuses/Orbits: No sinus fluid levels or advanced mucosal thickening. No mastoid effusion. Normal orbits. IMPRESSION: Unchanged examination without acute intracranial abnormality. Gray-white differentiation remains distinct. Electronically Signed   By: Ulyses Jarred M.D.   On: 09/07/2016 05:23   Ct Head Wo Contrast  Result Date: 09/06/2016 CLINICAL DATA:  Status post neurovascular intervention. EXAM: CT HEAD WITHOUT CONTRAST TECHNIQUE: Contiguous axial images were obtained from the base of the skull through the vertex without intravenous contrast. COMPARISON:  CTA head neck 09/05/2016 CTP head 09/05/2016 FINDINGS: Brain: No mass lesion, intraparenchymal hemorrhage or extra-axial collection. No evidence of acute cortical infarct. There is periventricular hypoattenuation compatible with chronic microvascular disease. Vascular: No hyperdense vessel or unexpected calcification. Skull: Normal visualized skull base, calvarium and extracranial soft tissues. Sinuses/Orbits: No sinus fluid  levels or advanced mucosal thickening. No  mastoid effusion. Normal orbits. IMPRESSION: No hemorrhage or other acute abnormality following neurovascular intervention. Gray-white differentiation is preserved in the region of ischemia demonstrated on the earlier perfusion study. Electronically Signed   By: Ulyses Jarred M.D.   On: 09/06/2016 00:30   Ct Angio Neck W And/or Wo Contrast  Result Date: 09/05/2016 EXAM: CT ANGIOGRAPHY HEAD AND NECK CT PERFUSION BRAIN TECHNIQUE: Multidetector CT imaging of the head and neck was performed using the standard protocol during bolus administration of intravenous contrast. Multiplanar CT image reconstructions and MIPs were obtained to evaluate the vascular anatomy. Carotid stenosis measurements (when applicable) are obtained utilizing NASCET criteria, using the distal internal carotid diameter as the denominator. Multiphase CT imaging of the brain was performed following IV bolus contrast injection. Subsequent parametric perfusion maps were calculated using RAPID software. COMPARISON:  None. FINDINGS: CTA NECK FINDINGS Aortic arch: Bovine arch. Imaged portion shows no evidence of aneurysm or dissection. No significant stenosis of the major arch vessel origins. Minimal calcific atherosclerosis of the aortic arch. Right carotid system: No evidence of dissection, stenosis (50% or greater) or occlusion. Moderate calcified plaque of the carotid bifurcation with minimal less than 30% proximal ICA stenosis. Left carotid system: No evidence of dissection, stenosis (50% or greater) or occlusion. Mild calcified plaque of the bifurcation. Vertebral arteries: Codominant. No evidence of dissection, stenosis (50% or greater) or occlusion. Skeleton: Moderate cervical degenerative changes with upper cervical facet arthrosis greater on the right and discogenic degenerative disease greatest at the C4 through C7 levels. No high-grade bony canal stenosis. Other neck: Negative. Upper chest: 5 mm nodule in the right upper lobe (series 8,  image 347). Review of the MIP images confirms the above findings CTA HEAD FINDINGS Anterior circulation: Left MCA superior division origin occlusion with intermediate collateralization. The point of occlusion is in the left anterior sylvian fissure corresponding to the hyperdensity on prior noncontrast CT of the head (series 10, image 147) and at the branching origin of the inferior division which makes an approximately 90 degrees angle downward turn relative to the M1 (series 13, image 31). Otherwise the anterior circulation is patent without high-grade stenosis, aneurysm, or occlusion. Calcified plaque of the carotid siphons with mild right-sided distal cavernous stenosis. Posterior circulation: No significant stenosis, proximal occlusion, aneurysm, or vascular malformation. Venous sinuses: As permitted by contrast timing, patent. Anatomic variants: Right larger than left posterior communicating artery is and diminutive patent anterior communicating artery. Review of the MIP images confirms the above findings CT Brain Perfusion Findings: CBF (<30%) Volume: 57mL Perfusion (Tmax>6.0s) volume: 129mL Mismatch Volume: 153mL Infarction Location:Perfusion anomaly is centered in the left lateral frontal and parietal lobes. IMPRESSION: CT perfusion: No infarct core (CBF less than 30%) by automated RAPID processing. At risk ischemia in the left superior MCA distribution with volume 108 cc. CTA head: 1. Left MCA superior division origin occlusion with intermediate collateralization. 2. Otherwise patent circle of Willis without significant stenosis or aneurysm identified. CTA neck: 1. Patent carotid and vertebral arteries. No dissection, aneurysm, or hemodynamically significant stenosis is identified. 2. 5 mm right upper lobe pulmonary nodule. No follow-up needed if patient is low-risk. Non-contrast chest CT can be considered in 12 months if patient is high-risk. This recommendation follows the consensus statement: Guidelines  for Management of Incidental Pulmonary Nodules Detected on CT Images: From the Fleischner Society 2017; Radiology 2017; 284:228-243. These results were called by telephone at the time of interpretation on 09/05/2016 at 9:09 pm  to Dr. Cheral Marker, who verbally acknowledged these results. Electronically Signed   By: Kristine Garbe M.D.   On: 09/05/2016 21:26   Ct Cerebral Perfusion W Contrast  Result Date: 09/05/2016 EXAM: CT ANGIOGRAPHY HEAD AND NECK CT PERFUSION BRAIN TECHNIQUE: Multidetector CT imaging of the head and neck was performed using the standard protocol during bolus administration of intravenous contrast. Multiplanar CT image reconstructions and MIPs were obtained to evaluate the vascular anatomy. Carotid stenosis measurements (when applicable) are obtained utilizing NASCET criteria, using the distal internal carotid diameter as the denominator. Multiphase CT imaging of the brain was performed following IV bolus contrast injection. Subsequent parametric perfusion maps were calculated using RAPID software. COMPARISON:  None. FINDINGS: CTA NECK FINDINGS Aortic arch: Bovine arch. Imaged portion shows no evidence of aneurysm or dissection. No significant stenosis of the major arch vessel origins. Minimal calcific atherosclerosis of the aortic arch. Right carotid system: No evidence of dissection, stenosis (50% or greater) or occlusion. Moderate calcified plaque of the carotid bifurcation with minimal less than 30% proximal ICA stenosis. Left carotid system: No evidence of dissection, stenosis (50% or greater) or occlusion. Mild calcified plaque of the bifurcation. Vertebral arteries: Codominant. No evidence of dissection, stenosis (50% or greater) or occlusion. Skeleton: Moderate cervical degenerative changes with upper cervical facet arthrosis greater on the right and discogenic degenerative disease greatest at the C4 through C7 levels. No high-grade bony canal stenosis. Other neck: Negative. Upper  chest: 5 mm nodule in the right upper lobe (series 8, image 347). Review of the MIP images confirms the above findings CTA HEAD FINDINGS Anterior circulation: Left MCA superior division origin occlusion with intermediate collateralization. The point of occlusion is in the left anterior sylvian fissure corresponding to the hyperdensity on prior noncontrast CT of the head (series 10, image 147) and at the branching origin of the inferior division which makes an approximately 90 degrees angle downward turn relative to the M1 (series 13, image 31). Otherwise the anterior circulation is patent without high-grade stenosis, aneurysm, or occlusion. Calcified plaque of the carotid siphons with mild right-sided distal cavernous stenosis. Posterior circulation: No significant stenosis, proximal occlusion, aneurysm, or vascular malformation. Venous sinuses: As permitted by contrast timing, patent. Anatomic variants: Right larger than left posterior communicating artery is and diminutive patent anterior communicating artery. Review of the MIP images confirms the above findings CT Brain Perfusion Findings: CBF (<30%) Volume: 32mL Perfusion (Tmax>6.0s) volume: 174mL Mismatch Volume: 123mL Infarction Location:Perfusion anomaly is centered in the left lateral frontal and parietal lobes. IMPRESSION: CT perfusion: No infarct core (CBF less than 30%) by automated RAPID processing. At risk ischemia in the left superior MCA distribution with volume 108 cc. CTA head: 1. Left MCA superior division origin occlusion with intermediate collateralization. 2. Otherwise patent circle of Willis without significant stenosis or aneurysm identified. CTA neck: 1. Patent carotid and vertebral arteries. No dissection, aneurysm, or hemodynamically significant stenosis is identified. 2. 5 mm right upper lobe pulmonary nodule. No follow-up needed if patient is low-risk. Non-contrast chest CT can be considered in 12 months if patient is high-risk. This  recommendation follows the consensus statement: Guidelines for Management of Incidental Pulmonary Nodules Detected on CT Images: From the Fleischner Society 2017; Radiology 2017; 284:228-243. These results were called by telephone at the time of interpretation on 09/05/2016 at 9:09 pm to Dr. Cheral Marker, who verbally acknowledged these results. Electronically Signed   By: Kristine Garbe M.D.   On: 09/05/2016 21:26   Portable Chest Xray  Result Date:  09/06/2016 CLINICAL DATA:  Respiratory failure, post intubation EXAM: PORTABLE CHEST 1 VIEW COMPARISON:  None. FINDINGS: The tip of an endotracheal tube is 4.4 cm above the carina. Heart is enlarged with mild vascular congestion. No aortic aneurysm. There is aortic atherosclerosis. No pneumothorax or pulmonary consolidation. No significant effusions. Left-sided ICD device with right ventricular lead is identified. A gastric tube extends below the left hemidiaphragm and appears to be coiled in the expected location of the stomach. Old right third and fourth rib fractures. IMPRESSION: 1. Endotracheal tube tip in satisfactory position 4.4 cm above the carina. Gastric tube in the stomach. 2. Cardiomegaly with mild vascular congestion noted. Electronically Signed   By: Ashley Royalty M.D.   On: 09/06/2016 00:50   Ir Percutaneous Art Thrombectomy/infusion Intracranial Inc Diag Angio  Result Date: 09/07/2016 INDICATION: Acute onset of global aphasia and right-sided weakness. Abnormal CT angiogram of the head and neck. EXAM: 1. EMERGENT LARGE VESSEL OCCLUSION THROMBOLYSIS (anterior CIRCULATION) COMPARISON:  CT angiogram of the head and neck of 09/05/2016. MEDICATIONS: Ancef 1 g IV antibiotic was administered within 1 hour of the procedure. ANESTHESIA/SEDATION: General anesthesia. CONTRAST:  Isovue 300 approximately 75 mL. FLUOROSCOPY TIME:  Fluoroscopy Time: 17 minutes 12 seconds (1677 mGy). COMPLICATIONS: None immediate. TECHNIQUE: Following a full explanation of the  procedure along with the potential associated complications, an informed witnessed consent was obtained from the patient's Godson . The risks of intracranial hemorrhage of 10%, worsening neurological deficit, ventilator dependency, death and inability to revascularize were all reviewed in detail with the patient's Godson. The patient was then put under general anesthesia by the Department of Anesthesiology at [facility]. The right groin was prepped and draped in the usual sterile fashion. Thereafter using modified Seldinger technique, transfemoral access into the right common femoral artery was obtained without difficulty. Over a 0.035 inch guidewire a 5 French Pinnacle sheath was inserted. Through this, and also over a 0.035 inch guidewire a 5 [place] JB 1 catheter was advanced to the aortic arch region and selectively positioned in the the left common carotid artery. FINDINGS: The left common carotid arteriogram demonstrates the left external carotid artery and its major branches to be widely patent. The left internal carotid artery at the bulb to the cranial skull base opacifies normally. The petrous, cavernous and supraclinoid segments opacify normally. The left middle cerebral artery M1 segment and the inferior divisions opacify into the capillary and venous phases. There is a large hyperperfusion noted in the superior division of the left middle cerebral artery with delayed fill-in from collaterals from the left pericallosal artery. A stump of an occluded prominent superior division of the left middle cerebral artery is noted. PROCEDURE: The diagnostic JB 1 catheter in the left common carotid artery was exchanged over a 0.035 inch 300 cm Rosen exchange guidewire for an 8 French 55 cm Brite tip neurovascular sheath using biplane roadmap technique and constant fluoroscopic guidance. Good aspiration obtained from the hub of the 8 French neurovascular catheter. This was then connected to continuous  heparinized saline infusion. Over the [facility] exchange guidewire, a 85 cm 8 [place] FlowGate balloon guide catheter which had been prepped with 50% contrast and 50% heparinized saline infusion was advanced and positioned just proximal to the left common carotid bifurcation. The guidewire was removed. Good aspiration obtained from the hub of the 8 [place] FlowGate guide catheter. Over a 0.035 inch Roadrunner guidewire, using biplane roadmap technique and constant fluoroscopic guidance, the FlowGate guide catheter was advanced to the  distal cervical segment of the left internal carotid artery. The guidewire was removed. Good aspiration obtained from the hub of the 8 [place] FlowGate guide catheter. A control arteriogram performed centered intracranially demonstrated no change in the intracranial circulation. At this time, in a coaxial manner and with constant heparinized saline infusion using biplane roadmap technique and constant fluoroscopic guidance, the combination of a 6 French 132 cm Catalyst guide catheter inside of which was a 0.021 Trevo ProVue microcatheter combination was advanced over a 0.014 inch Softip Synchro micro guidewire to the distal end of the [facility] guide catheter. With the micro guidewire leading with a J-tip configuration, the combination was navigated with a torque device to the supraclinoid left ICA. Thereafter, the wire was advanced through the occluded codominant superior division of the left middle cerebral artery into the M2 M3 region followed by the microcatheter. The 6 [place] Catalyst guide catheter was positioned in the proximal M1 segment. A 4 mm x 40 mm Solitaire FR retrieval device was then advanced to the distal portion of the Trevo ProVue microcatheter. The O ring on the delivery microcatheter was loosened. With slight forward traction with the right hand on the delivery micro guidewire, with the left hand the delivery microcatheter was retrieved unsheathing the retrieval Solitaire  FR 4 mm x 40 mm retrieval device completely. A control arteriogram performed through the 6 [place] Catalyst guide catheter in the left internal carotid artery demonstrated no significant opacification of the superior division of the left middle cerebral artery. The proximal portion of the retrieval device was captured into the distal microcatheter. Thereafter, after having inflated the occlusion balloon in the left internal carotid artery of the [facility] guide catheter, the combination of the retrieval device, the microcatheter, and also of the 6 French 132 cm Catalyst guide catheter was retrieved as constant aspiration was applied with a 60 mL syringe at the hub of the [facility] guide catheter, and also at the hub of the 6 [place] Catalyst guide catheter. This combination was gently retrieved and removed while aspiration was continued. The interstices of the retrieval device had chunks of clot as well as in the [place]. Aspiration was continued as the balloon was deflated in the left internal carotid artery. Free back bleed of blood was noted. A control arteriogram performed through the [facility] guide catheter in the left internal carotid artery demonstrated complete angiographic revascularization of the superior division of the left middle cerebral artery. The left anterior cerebral artery and the inferior division remained widely patent. There was moderate spasm noted in the distal M1 segment which responded to 25 mics of nitroglycerin x 3 via the guide catheter in the left internal carotid artery. A final control arteriogram performed through the [facility] guide catheter in the left internal carotid artery demonstrated complete angiographic revascularization of the left middle cerebral artery and the left anterior cerebral artery. A TICI 3 reperfusion was achieved. Throughout the procedure, the patient's blood pressure and neurological status remained stable. No angiographic evidence of mass-effect or  extravasation was seen. The 8 [place] FlowGate guide catheter and the 8 [place] Brite tip neurovascular sheath were then retrieved into the abdominal aorta and exchanged over a J-tip guidewire for a 9 [place] Pinnacle sheath. This was then connected to continuous heparinized saline infusion. The right groin appeared soft without evidence of a hematoma. The distal pulses remained 2+ palpable in the dorsalis pedis bilaterally, and both posterior tibials were 1+ unchanged from prior to the procedure. IMPRESSION: Endovascular complete revascularization of  the superior division of the left middle cerebral artery with 1 pass of the 4 mm x 40 mm Solitaire FR retrieval device, achieving a TICI 3 reperfusion. Time from groin puncture to TICI 3 reperfusion 49 minutes. PLAN: The patient then transferred to CT scanner for postprocedural CT scan of the brain. Electronically Signed   By: Luanne Bras M.D.   On: 09/06/2016 12:39   Ct Head Code Stroke W/o Cm  Result Date: 09/05/2016 CLINICAL DATA:  Code stroke.  Aphasia EXAM: CT HEAD WITHOUT CONTRAST TECHNIQUE: Contiguous axial images were obtained from the base of the skull through the vertex without intravenous contrast. COMPARISON:  None. FINDINGS: Brain: Generalized atrophy. Negative for hydrocephalus. Negative for acute infarct. No intracranial hemorrhage or mass. Vascular: Short segment hyperdensity left M2 segment compatible with acute thrombus. No other hyperdense vessel. Atherosclerotic disease in the cavernous carotid bilaterally. Skull: Negative Sinuses/Orbits: Negative Other: None ASPECTS ([place] Stroke Program Early CT Score) - Ganglionic level infarction (caudate, lentiform nuclei, internal capsule, insula, M1-M3 cortex): 7 - Supraganglionic infarction (M4-M6 cortex): 3 Total score (0-10 with 10 being normal): 10 IMPRESSION: 1. Negative for acute infarct or hemorrhage. Hyperdense left M2 segment compatible with acute thrombosis. 2. ASPECTS is 10 These results  were called by telephone at the time of interpretation on 09/05/2016 at 8:40 pm to Dr. Cheral Marker, who verbally acknowledged these results. Electronically Signed   By: Franchot Gallo M.D.   On: 09/05/2016 20:41    Labs:  CBC:  Recent Labs  09/05/16 2020 09/05/16 2028 09/06/16 0500 09/07/16 0401  WBC 5.0  --  4.4 5.8  HGB 11.7* 12.9* 10.5* 10.4*  HCT 34.3* 38.0* 30.5* 31.8*  PLT 188  --  157 144*    COAGS:  Recent Labs  09/05/16 2020  INR 1.30  APTT 33    BMP:  Recent Labs  09/05/16 2020 09/05/16 2028 09/06/16 0500 09/07/16 0401  NA 135 139 137 138  K 4.1 4.4 3.9 3.5  CL 104 103 107 110  CO2 23  --  23 24  GLUCOSE 134* 139* 115* 108*  BUN 15 19 13 7   CALCIUM 9.1  --  8.3* 8.6*  CREATININE 1.31* 1.30* 1.11 0.99  GFRNONAA >60  --  >60 >60  GFRAA >60  --  >60 >60    LIVER FUNCTION TESTS:  Recent Labs  09/05/16 2020 09/07/16 0401  BILITOT 0.9  --   AST 43*  --   ALT 33  --   ALKPHOS 72  --   PROT 7.1  --   ALBUMIN 4.0 3.3*    Assessment and Plan: Lt common carotid arteriogram, followed by complete revascularization of occluded Lt MCA  Patient has been extubated and is doing well.  Sheath removed, groin soft and intact.  No evidence of hematoma or pseudoaneurysm. Further plans per neurology and CCM. IR available if needed.   Electronically Signed: Docia Barrier 09/07/2016, 10:46 AM   I spent a total of 15 Minutes at the the patient's bedside AND on the patient's hospital floor or unit, greater than 50% of which was counseling/coordinating care for CVA

## 2016-09-08 DIAGNOSIS — I63312 Cerebral infarction due to thrombosis of left middle cerebral artery: Secondary | ICD-10-CM

## 2016-09-08 MED ORDER — CARVEDILOL 25 MG PO TABS: 25.0000 mg | ORAL_TABLET | Freq: Two times a day (BID) | ORAL | 6 refills | Status: DC

## 2016-09-08 MED ORDER — APIXABAN 5 MG PO TABS: 5.0000 mg | ORAL_TABLET | Freq: Two times a day (BID) | ORAL | 6 refills | Status: DC

## 2016-09-08 NOTE — Progress Notes (Addendum)
  Speech Language Pathology Treatment: Dysphagia  Patient Details Name: Glenn [name] MRN: [id number] DOB: [birth year] Today's Date: 09/08/2016 Time: 7127-8718 SLP Time Calculation (min) (ACUTE ONLY): 9 min  Assessment / Plan / Recommendation Clinical Impression  SLP followed up for diet tolerance. Pts swallow appearing within functional limits. Pt admits to decreased appetite however reports improvements since admission. SLP assessed pt with regular and thin liquids and pt without overt s/sx of aspiration with any consistencies. Continue regular thin liquid diet. No further ST needs identified.    HPI HPI: Pt is a 66 y.o.maleadmitted 7/18 with R hemiparesis and aphasia, found to have left MCA occlusion now s/p thrombectomy. He remained intubated 7/18-7/19. PMH includes chronic Afib on xarelto, CHF, Hepatitis C, and HTN      SLP Plan  All goals met       Recommendations  Diet recommendations: Regular;Thin liquid Medication Administration: Whole meds with liquid Supervision: Patient able to self feed Compensations: Slow rate;Small sips/bites                Oral Care Recommendations: Oral care BID Follow up Recommendations: None SLP Visit Diagnosis: Dysphagia, unspecified (R13.10) Plan: All goals met       GO               Arvil Chaco MA, CCC-SLP Acute Care Speech Language Pathologist    Levi Aland 09/08/2016, 9:16 AM

## 2016-09-08 NOTE — Evaluation (Signed)
Physical Therapy Evaluation Patient Details Name: Glenn [name] MRN: [id number] DOB: 05-Mar-1950 Today's Date: 09/08/2016   History of Present Illness  Pt is a 66 y.o. male admitted 7/18 with R hemiparesis and aphasia, found to have left MCA occlusion now s/p thrombectomy. He remained intubated 7/18-7/19. PMH includes chronic Afib on xarelto, CHF, Hepatitis C, and HTN   Clinical Impression  Patient seen for mobility assessment and evaluation s/p stroke. At this time, patient is steady with ambulation, good speed and overall mobility, some DOE with activity 2/4 with saturations to 91% with increased ambulation.  Do not feel patient will require any further acute PT needs, will sign off.   Follow Up Recommendations No PT follow up    Equipment Recommendations  None recommended by PT    Recommendations for Other Services       Precautions / Restrictions Precautions Precautions: Fall Restrictions Weight Bearing Restrictions: No      Mobility  Bed Mobility Overal bed mobility: Modified Independent             General bed mobility comments: increased time to perform  Transfers Overall transfer level: Independent Equipment used: None                Ambulation/Gait Ambulation/Gait assistance: Independent Ambulation Distance (Feet): 450 Feet Assistive device: None Gait Pattern/deviations: WFL(Within Functional Limits)   Gait velocity interpretation: at or above normal speed for age/gender General Gait Details: steady with ambulation, good speed and overall mobility, some DOE with activity 2/4 with saturations to 91% with increased ambulation.  Stairs            Wheelchair Mobility    Modified Rankin (Stroke Patients Only) Modified Rankin (Stroke Patients Only) Pre-Morbid Rankin Score: No significant disability Modified Rankin: No significant disability     Balance     Sitting balance-Leahy Scale: Good       Standing balance-Leahy Scale: Good               High level balance activites: Side stepping;Backward walking;Direction changes;Turns;Sudden stops;Head turns High Level Balance Comments: good dynamic balance with higher level activities, no overt LOB             Pertinent Vitals/Pain Pain Assessment: No/denies pain    Home Living Family/patient expects to be discharged to:: Private residence Living Arrangements: Children Available Help at Discharge: Family;Available PRN/intermittently Type of Home: Apartment Home Access: Level entry     Home Layout: One level Home Equipment: Cane - single point      Prior Function Level of Independence: Independent         Comments: just moved to [place] from [place], very independent     Hand Dominance   Dominant Hand: Right    Extremity/Trunk Assessment   Upper Extremity Assessment Upper Extremity Assessment: Overall WFL for tasks assessed    Lower Extremity Assessment Lower Extremity Assessment: Overall WFL for tasks assessed       Communication   Communication: No difficulties  Cognition Arousal/Alertness: Awake/alert Behavior During Therapy: WFL for tasks assessed/performed Overall Cognitive Status: Within Functional Limits for tasks assessed                                        General Comments      Exercises     Assessment/Plan    PT Assessment Patent does not need any further PT services  PT Problem List  PT Treatment Interventions      PT Goals (Current goals can be found in the Care Plan section)  Acute Rehab PT Goals PT Goal Formulation: All assessment and education complete, DC therapy    Frequency     Barriers to discharge        Co-evaluation               AM-PAC PT "6 Clicks" Daily Activity  Outcome Measure Difficulty turning over in bed (including adjusting bedclothes, sheets and blankets)?: None Difficulty moving from lying on back to sitting on the side of the bed? : None Difficulty  sitting down on and standing up from a chair with arms (e.g., wheelchair, bedside commode, etc,.)?: None Help needed moving to and from a bed to chair (including a wheelchair)?: None Help needed walking in hospital room?: None Help needed climbing 3-5 steps with a railing? : None 6 Click Score: 24    End of Session Equipment Utilized During Treatment: Gait belt Activity Tolerance: Patient tolerated treatment well Patient left: in bed;with call bell/phone within reach Nurse Communication: Mobility status PT Visit Diagnosis: Other symptoms and signs involving the nervous system (R29.898)    Time: 2876-8115 PT Time Calculation (min) (ACUTE ONLY): 18 min   Charges:   PT Evaluation $PT Eval Moderate Complexity: 1 Procedure     PT G CodesAlben [name], PT DPT N[facility] [phone]   Glenn [name] 09/08/2016, 8:51 AM

## 2016-09-08 NOTE — Progress Notes (Signed)
PULMONARY / CRITICAL CARE MEDICINE   Name: Glenn [name] MRN: [id number] DOB: Aug 31, 1950    ADMISSION DATE:  09/05/2016 CONSULTATION DATE:  09/05/16  REFERRING MD:  Cheral Marker  CHIEF COMPLAINT:  AMS  Brief patient summary:   Admitted 7/18 with acute ischemic stroke due to left MCA occlusion requiring IR revascularization and intubation.  PCCM consulted for vent management.   SUBJECTIVE:  Some hypoxemia overnight, required IV lasix Feels much better today   VITAL SIGNS: BP (!) 113/50   Pulse 70   Temp 97.7 F (36.5 C) (Oral)   Resp 17   Ht 5\' 7"  (1.702 m)   Wt 83 kg (182 lb 15.7 oz) Comment: From office visit notes 07/2016  SpO2 94%   BMI 28.66 kg/m   HEMODYNAMICS:   VENTILATOR SETTINGS:    INTAKE / OUTPUT: I/O last 3 completed shifts: In: 1167.3 [P.O.:240; I.V.:927.3] Out: 2750 [Urine:2750]   PHYSICAL EXAMINATION:  General:  Resting comfortably in bed HENT: NCAT OP clear PULM: CTA B, normal effort CV: RRR, no mgr GI: BS+, soft, nontender MSK: normal bulk and tone Neuro: awake, alert, no distress, MAEW   LABS:  BMET  Recent Labs Lab 09/05/16 2020 09/05/16 2028 09/06/16 0500 09/07/16 0401  NA 135 139 137 138  K 4.1 4.4 3.9 3.5  CL 104 103 107 110  CO2 23  --  23 24  BUN 15 19 13 7   CREATININE 1.31* 1.30* 1.11 0.99  GLUCOSE 134* 139* 115* 108*    Electrolytes  Recent Labs Lab 09/05/16 2020 09/06/16 0500 09/06/16 1130 09/07/16 0401  CALCIUM 9.1 8.3*  --  8.6*  MG  --   --  1.6* 1.7  PHOS  --   --  3.6 2.7    CBC  Recent Labs Lab 09/05/16 2020 09/05/16 2028 09/06/16 0500 09/07/16 0401  WBC 5.0  --  4.4 5.8  HGB 11.7* 12.9* 10.5* 10.4*  HCT 34.3* 38.0* 30.5* 31.8*  PLT 188  --  157 144*    Coag's  Recent Labs Lab 09/05/16 2020  APTT 33  INR 1.30    Sepsis Markers No results for input(s): LATICACIDVEN, PROCALCITON, O2SATVEN in the last 168 hours.  ABG  Recent Labs Lab 09/06/16 0049  PHART 7.270*  PCO2ART  52.1*  PO2ART 239.0*    Liver Enzymes  Recent Labs Lab 09/05/16 2020 09/07/16 0401  AST 43*  --   ALT 33  --   ALKPHOS 72  --   BILITOT 0.9  --   ALBUMIN 4.0 3.3*    Cardiac Enzymes No results for input(s): TROPONINI, PROBNP in the last 168 hours.  Glucose  Recent Labs Lab 09/05/16 2019  GLUCAP 135*    Imaging No results found.   STUDIES:  CT head 7/18 > hyperdense left M2 segment. CTA head 7/18 > let MCA occlusion. CT Head 7/20 >> no evidence of evolving CVA  CULTURES: None.  ANTIBIOTICS: None.  SIGNIFICANT EVENTS: 7/18 > admit.  LINES/TUBES: ETT 7/18 > 7/19  DISCUSSION: 66 y.o. male admitted 7/18 with left MCA occlusion.  Taken to IR for revascularization then returned to ICU on vent. Extubated successfully 7/19  ASSESSMENT / PLAN:  NEUROLOGIC A:   Acute left MCA stroke> per neurology P:   Per neurology    CARDIOVASCULAR A:  A.fib with RVR - rate controlled after sedation Hx CHF (no echo on file), A.fib (on xarelto) - rate controlled after sedated, amio not started Acute decompensated diastolic CHF overnight 7/41> much better  post lasix, doing well on home dose lasix now P:  Continue home coreg, nitrate, hydralazine, ACE-Inh Agree with giving home dose lasix Monitor weight at home F/u with cardiology   Roselie Awkward, MD [place] Pager: [phone] Cell: [phone] After 3pm or if no response, call [phone]

## 2016-09-08 NOTE — Progress Notes (Signed)
Pt given discharge paperwork and all questions answered. RN assisted patient out.

## 2016-09-08 NOTE — Discharge Summary (Signed)
Stroke Discharge Summary  Patient ID: Glenn [name]   MRN: [id number]      DOB: 04/24/1950  Date of Admission: 09/05/2016 Date of Discharge: 09/08/2016  Attending Physician:  Rosalin Hawking, MD, Stroke MD Consultant(s):    pulmonary/intensive care Dr Lake Bells Patient's PCP:  Rogue Bussing, MD  DISCHARGE DIAGNOSIS: Left MCA Stroke  Due to left MCA occlusion due to atrial fibrillation S/P mechanical thrombectomy with complete recanalization and excellent clinical recovery Active Problems:   Stroke (cerebrum) ([name])   Cerebral infarction ([place])   Persistent atrial fibrillation ([place])   Atrial fibrillation with RVR ([place])   Ischemic cardiomyopathy   Past Medical History:  Diagnosis Date  . CHF (congestive heart failure) ([place])   . Hepatitis C   . Hypertension    Past Surgical History:  Procedure Laterality Date  . CARDIAC DEFIBRILLATOR PLACEMENT  2015  . EYE SURGERY Left 1990  . IR PERCUTANEOUS ART THROMBECTOMY/INFUSION INTRACRANIAL INC DIAG ANGIO  09/05/2016  . RADIOLOGY WITH ANESTHESIA N/A 09/05/2016   Procedure: RADIOLOGY WITH ANESTHESIA;  Surgeon: Luanne Bras, MD;  Location: [facility];  Service: Radiology;  Laterality: N/A;    Allergies as of 09/08/2016   No Known Allergies     Medication List    STOP taking these medications   rivaroxaban 20 MG Tabs tablet Commonly known as:  XARELTO     TAKE these medications   apixaban 5 MG Tabs tablet Commonly known as:  ELIQUIS Take 1 tablet (5 mg total) by mouth 2 (two) times daily.   aspirin EC 81 MG tablet Take 81 mg by mouth daily.   busPIRone 7.5 MG tablet Commonly known as:  BUSPAR Take 7.5 mg by mouth daily.   carvedilol 25 MG tablet Commonly known as:  COREG Take 1 tablet (25 mg total) by mouth 2 (two) times daily with a meal. What changed:  how much to take   Fluticasone-Salmeterol 250-50 MCG/DOSE Aepb Commonly known as:  ADVAIR Inhale 1 puff into the lungs 2 (two) times daily.   furosemide 20  MG tablet Commonly known as:  LASIX Take 1 tablet (20 mg total) by mouth daily.   hydrALAZINE 25 MG tablet Commonly known as:  APRESOLINE Take 25 mg by mouth 2 (two) times daily.   isosorbide mononitrate 10 MG tablet Commonly known as:  ISMO,MONOKET Take 10 mg by mouth daily.   lisinopril 5 MG tablet Commonly known as:  PRINIVIL,ZESTRIL Take 5 mg by mouth daily.   omeprazole 20 MG capsule Commonly known as:  PRILOSEC Take 20 mg by mouth daily.   thiamine 100 MG tablet Commonly known as:  VITAMIN B-1 Take 100 mg by mouth daily.   Vitamin D3 1000 units Caps Take 1,000 Units by mouth daily.       LABORATORY STUDIES CBC    Component Value Date/Time   WBC 5.8 09/07/2016 0401   RBC 3.25 (L) 09/07/2016 0401   HGB 10.4 (L) 09/07/2016 0401   HCT 31.8 (L) 09/07/2016 0401   PLT 144 (L) 09/07/2016 0401   MCV 97.8 09/07/2016 0401   MCH 32.0 09/07/2016 0401   MCHC 32.7 09/07/2016 0401   RDW 12.8 09/07/2016 0401   LYMPHSABS 1.0 09/06/2016 0500   MONOABS 0.3 09/06/2016 0500   EOSABS 0.1 09/06/2016 0500   BASOSABS 0.0 09/06/2016 0500   CMP    Component Value Date/Time   NA 138 09/07/2016 0401   K 3.5 09/07/2016 0401   CL 110 09/07/2016 0401  CO2 24 09/07/2016 0401   GLUCOSE 108 (H) 09/07/2016 0401   BUN 7 09/07/2016 0401   CREATININE 0.99 09/07/2016 0401   CALCIUM 8.6 (L) 09/07/2016 0401   PROT 7.1 09/05/2016 2020   ALBUMIN 3.3 (L) 09/07/2016 0401   AST 43 (H) 09/05/2016 2020   ALT 33 09/05/2016 2020   ALKPHOS 72 09/05/2016 2020   BILITOT 0.9 09/05/2016 2020   GFRNONAA >60 09/07/2016 0401   GFRAA >60 09/07/2016 0401   COAGS Lab Results  Component Value Date   INR 1.30 09/05/2016   Lipid Panel    Component Value Date/Time   CHOL 96 09/06/2016 0500   TRIG 128 09/06/2016 0500   HDL 26 (L) 09/06/2016 0500   CHOLHDL 3.7 09/06/2016 0500   VLDL 26 09/06/2016 0500   LDLCALC 44 09/06/2016 0500   HgbA1C  Lab Results  Component Value Date   HGBA1C 4.7 (L)  09/06/2016   Urinalysis No results found for: COLORURINE, APPEARANCEUR, LABSPEC, PHURINE, GLUCOSEU, HGBUR, BILIRUBINUR, KETONESUR, PROTEINUR, UROBILINOGEN, NITRITE, LEUKOCYTESUR Urine Drug Screen No results found for: LABOPIA, COCAINSCRNUR, LABBENZ, AMPHETMU, THCU, LABBARB  Alcohol Level    Component Value Date/Time   ETH 8 (H) 09/05/2016 2020      SIGNIFICANT DIAGNOSTIC STUDIES   Ct Angio Head W Or Wo Contrast Ct Angio Neck W And/or Wo Contrast 09/05/2016 CTA head:  1. Left MCA superior division origin occlusion with intermediate collateralization.  2. Otherwise patent circle of Willis without significant stenosis or aneurysm identified.   CTA neck:  1. Patent carotid and vertebral arteries. No dissection, aneurysm, or hemodynamically significant stenosis is identified.  2. 5 mm right upper lobe pulmonary nodule.  Ct Head Wo Contrast 09/06/2016 No hemorrhage or other acute abnormality following neurovascular intervention. Gray-white differentiation is preserved in the region of ischemia demonstrated on the earlier perfusion study.  Ct Cerebral Perfusion W Contrast 09/05/2016 CT perfusion: No infarct core (CBF less than 30%) by automated RAPID processing. At risk ischemia in the left superior MCA distribution with volume 108 cc. CTA head:    Ct Head Code Stroke W/o Cm 09/05/2016  1. Negative for acute infarct or hemorrhage. Hyperdense left M2 segment compatible with acute thrombosis.  2. ASPECTS is 10   DSA - complete revascularization of occluded Lt MCA superior division with x 1 pass with solitaire 87mm x 40 mm retriever device achieving a TICI 3 reperfusion.  CUS - Findings are consistent with a 1-39 percent stenosis involving the right internal carotid artery and the left internal carotid artery. The vertebral arteries demonstrate antegrade flow.  TTE - Left ventricle: The cavity size was normal. Systolic function was moderately to severely reduced. The  estimated ejection fraction was in the range of 30% to 35%. Diffuse hypokinesis. Moderate hypokinesis of the anteroseptal, inferior, and inferoseptal myocardium. - Aortic valve: Transvalvular velocity was within the normal range. There was no stenosis. There was moderate regurgitation. Valve area (VTI): 2.25 cm^2. Valve area (Vmax): 2.37 cm^2. Valve area (Vmean): 2.34 cm^2. - Mitral valve: Transvalvular velocity was within the normal range. There was no evidence for stenosis. There was mild regurgitation. - Left atrium: The atrium was moderately dilated. - Right ventricle: The cavity size was normal. Wall thickness was normal. Systolic function was mildly reduced. - Right atrium: The atrium was moderately dilated. - Tricuspid valve: There was mild-moderate regurgitation. - Pulmonary arteries: Systolic pressure was mildly increased. PA peak pressure: 38 mm Hg (S).     HISTORY OF PRESENT ILLNESS Glenn [name] is  an 66 y.o. male who presents to the ED as a Code Stroke. He was last seen normal at 7:30 PM, then became suddenly mute with right upper extremity weakness and right facial droop. Deficits were noted by his son, who immediately called 911. At his baseline he is alert, oriented and fully independent.  On arrival to the ED he remained mute with right hemiparesis and prominent right facial droop, in addition to leftward gaze deviation and relative neglect of his right side.  CT head showed no hemorrhage or acute hypodensity. Hyperdense left M2 segment compatible with acute thrombosis was noted.  CTA of head revealed a left MCA superior division origin occlusion with intermediate collateralization. CTA neck revealed patent carotid and vertebral arteries. No dissection, aneurysm, or hemodynamically significant stenosis is identified. CT perfusion study showed no infarct core (CBF less than 30%) by automated RAPID processing. At risk ischemia in the left superior MCA  distribution with volume 108 cc was noted.  LSN: 7:30 PM tPA Given: No: On oral anticoagulation NIHSS: 15     HOSPITAL COURSE [name] is a 66 y.o. male with history of atrial fibrillation on Xarelto with implanted defibrillator, congestive heart failure, hepatitis C, and hypertension presenting with inability to speak, with right upper extremity weakness, and right facial droop. He did not receive IV t-PA due to arriving outside of the treatment window and on Xarelto.  S/p mechanical thrombectomy for left M2 occlusion. The patient did quite well. He was initially kept in intensive care unit and blood pressure was tightly controlled and was extubated. He had no focal findings on exam but and stroke scale of 0. He was seen by physical occupational speech therapy and had no therapy needs. Patient was switched to eliquis for secondary stroke prevention and aspirin 81 mg is added given his cardiac history and advise to follow-up with his cardiologist and had appointment on 10/12/16. He was asked to get a primary care physician and see him as soon as possible. He was advised to follow-up in the stroke clinic in 6 weeks. He was counseled to be compliant with his medications and to quit smoking and maintain aggressive risk factor modification  Stroke: acute left M2 infarct s/p IR with TICI3 recannulization, stroke likely due to atrial fibrillation and low EF although on Xarelto and ASA.    Resultant  Neuro deficit resolved  CT head: acute L M2 hyperdense with thrombus   MRI / MRA head: not able to obtain (implanted defibrillator)  CTA head/neck: L M2 occlusion with intermediate collateralization  DSA - left M2 occlusion s/p TICI3 recannulization  Repeat CT head no acute abnormalities  2D Echo: EF 30-35%  LDL 44  HgbA1c 4.7  SCDs for VTE prophylaxis  Diet Heart Room service appropriate? Yes; Fluid consistency: Thin  aspirin 81 mg daily and Xarelto (rivaroxaban) daily prior to  admission, now on ASA. Discussed with options about AC and pt would like to switch to Eliquis. Continue ASA 81  Ongoing aggressive stroke risk factor management  Therapy recommendations:   No follow-up therapies were recommended.  Disposition:   The patient will be discharged to a family member's home.  afib on Xarelto  Pt stated compliance with medication  Rate controlled  CT repeat no acute abnormalities  Discussed with options about AC and pt would like to switch to Eliquis. Continue ASA 81  Cardiomyopathy  EF 30-35%  Pt cardiac record in [place] not available at this time  Pt has cardiology appointment here  in [facility] on 10/12/16  Resume home meds including coreg, low dose lisinopril, isosorbide, hydralazine, and lasix  Hypertension  stable  Still on clevidipine  Wean off as able              SBP goal now < 180/105              Long-term BP goal normotensive  Tobacco abuse  Current smoker  Smoking cessation counseling will be provided   Other Stroke Risk Factors  ETOH use, advised to drink no more than 2 drink(s) a day  CHF   Other Active Problems   5 mm right upper lobe pulmonary nodule.   DISCHARGE EXAM Blood pressure (!) 151/77, pulse (!) 102, temperature 97.9 F (36.6 C), temperature source Oral, resp. rate (!) 23, height 5\' 7"  (1.702 m), weight 83 kg (182 lb 15.7 oz), SpO2 97 %.  General - Well nourished, well developed, in no apparent distress.  Ophthalmologic - Sharp disc margins OU.   Cardiovascular - irregularly irregular heart rate and rhythm.  Mental Status -  Level of arousal and orientation to time, place, and person were intact. Language including expression, naming, repetition, comprehension was assessed and found intact. Fund of Knowledge was assessed and was intact  Cranial Nerves II - XII - II - Visual field intact OU. III, IV, VI - Extraocular movements intact. V - Facial sensation intact bilaterally. VII - Facial  movement intact bilaterally. VIII - Hearing & vestibular intact bilaterally. X - Palate elevates symmetrically. XI - Chin turning & shoulder shrug intact bilaterally XII - Tongue protrusion intact.  Motor Strength - The patient's strength was normal in all extremities and pronator drift was absent.  Bulk was normal and fasciculations were absent.   Motor Tone - Muscle tone was assessed at the neck and appendages and was normal.  Reflexes - The patient's reflexes were 1+ in all extremities and he had no pathological reflexes.  Sensory - Light touch, temperature/pinprick were assessed and were symmetrical.    Coordination - The patient had normal movements in the hands with no ataxia or dysmetria.  Tremor was absent.  Gait and Station - deferred.   Discharge Diet   Diet Heart Room service appropriate? Yes; Fluid consistency: Thin liquids   DISCHARGE PLAN  Disposition:  Discharged to a family member's home.  aspirin 81 mg daily and Eliquis (apixaban) daily for secondary stroke prevention.  Ongoing risk factor control by Primary Care Physician at time of discharge  Follow-up Rogue Bussing, MD in 2 weeks.  Follow-up with Dr. Antony Contras, Stroke Clinic in 6 weeks, office to schedule an appointment.  Cardiology as scheduled 10/12/2016  Pulmonary will contact patient for follow up of pulmonary nodule. (spoke with Dr Waunita Schooner today)   40 minutes were spent preparing discharge.  Mikey Bussing PA-C [facility] Pager [phone] 09/08/2016, 1:58 PM I have personally examined this patient, reviewed notes, independently viewed imaging studies, participated in medical decision making and plan of care.ROS completed by me personally and pertinent positives fully documented  I have made any additions or clarifications directly to the above note. Agree with note above.    Antony Contras, MD Medical Director [place] Pager:  [phone] 09/08/2016 3:08 PM

## 2016-09-08 NOTE — Discharge Instructions (Signed)
1. Increase activity gradually as tolerated. 2. No tobacco products 3. Find a primary care provider for regular medical follow up. 4. Follow up with Cardiology as scheduled. 5. Pulmonary will contact you for follow up of pulmonary nodule. 6. Take medications as directed.

## 2016-09-08 NOTE — Progress Notes (Signed)
[facility]  Patient noted to have a 62mm pulmonary nodule on his CT neck.  Given smoking history he will need pulmonary follow up.   Roselie Awkward, MD [facility] Pager: [phone] Cell: [phone] After 3pm or if no response, call [phone]

## 2016-09-08 NOTE — Anesthesia Postprocedure Evaluation (Signed)
Anesthesia Post Note  Patient: Barney Blackwell  Procedure(s) Performed: Procedure(s) (LRB): RADIOLOGY WITH ANESTHESIA (N/A)     Patient location during evaluation: PACU Anesthesia Type: General Level of consciousness: awake Pain management: pain level controlled Vital Signs Assessment: post-procedure vital signs reviewed and stable Respiratory status: spontaneous breathing Cardiovascular status: stable Anesthetic complications: no    Last Vitals:  Vitals:   09/08/16 0500 09/08/16 0600  BP: 127/75 (!) 148/75  Pulse: 86 (!) 42  Resp: 19 (!) 23  Temp:      Last Pain:  Vitals:   09/08/16 0400  TempSrc: Oral  PainSc:                  Vonte Rossin

## 2016-09-10 LAB — VAS US CAROTID
LCCADSYS: 67 cm/s
LEFT ECA DIAS: -8 cm/s
LEFT VERTEBRAL DIAS: -25 cm/s
LICAPDIAS: -18 cm/s
Left CCA dist dias: 14 cm/s
Left CCA prox dias: -16 cm/s
Left CCA prox sys: -81 cm/s
Left ICA dist dias: -16 cm/s
Left ICA dist sys: -98 cm/s
Left ICA prox sys: -66 cm/s
RCCADSYS: -58 cm/s
RCCAPDIAS: 14 cm/s
RCCAPSYS: 73 cm/s
RIGHT ECA DIAS: -8 cm/s
RIGHT VERTEBRAL DIAS: -13 cm/s

## 2016-09-11 ENCOUNTER — Ambulatory Visit (INDEPENDENT_AMBULATORY_CARE_PROVIDER_SITE_OTHER): Payer: Medicaid Other | Admitting: Internal Medicine

## 2016-09-11 ENCOUNTER — Encounter: Payer: Self-pay | Admitting: Internal Medicine

## 2016-09-11 VITALS — BP 138/78 | HR 102 | Temp 98.0°F | Wt 192.0 lb

## 2016-09-11 DIAGNOSIS — Z599 Problem related to housing and economic circumstances, unspecified: Secondary | ICD-10-CM | POA: Diagnosis not present

## 2016-09-11 DIAGNOSIS — Z09 Encounter for follow-up examination after completed treatment for conditions other than malignant neoplasm: Secondary | ICD-10-CM | POA: Diagnosis not present

## 2016-09-11 DIAGNOSIS — Z8619 Personal history of other infectious and parasitic diseases: Secondary | ICD-10-CM | POA: Diagnosis not present

## 2016-09-11 DIAGNOSIS — M549 Dorsalgia, unspecified: Secondary | ICD-10-CM

## 2016-09-11 MED ORDER — DICLOFENAC SODIUM 1 % TD GEL: TRANSDERMAL | 1 refills | Status: DC

## 2016-09-11 NOTE — Patient Instructions (Signed)
Glenn [name],  It was nice to meet you today.  Continue daily exercise. Increase to 45 minutes as able.  If you have trouble with staying off cigarettes, consider nicotine patch or lozenge.  For upper back pain, continue stretches. You may want to try capsaicin cream (over the counter); this provides pain relief from heat. I have order a topical anti-inflammatory called diclofenac gel to try. This may need a prior auth from your pharmacy.  Please see me back in about 2 months.  Ask your cardiologist about starting a statin.  Best, Dr. Ola Spurr

## 2016-09-11 NOTE — Progress Notes (Signed)
[name] Progress Note  Subjective:  Glenn [name] is a 66 y.o. male who presents to meet new PCP and for hospital follow-up after stroke. He was admitted from 7/18 to 09/08/16 for left MCA stroke 2/2 occlusion and afib. He had mechanical thrombectomy. Upon initial presentation, he could not speak and had R sided weakness; after thrombectomy, he had complete recovery. He was switched from xarelto to eliquis. He also takes aspirin. To follow with Cardiology for cardiomyopathy with EF of 30-35%. He was discharged on previous home regimen of coreg, lisinopril, isosorbide, hydralazine and lasix. He was not started on a statin because his LDL was 44 on 09/06/16. Will follow with Pulmonology for RUL pulmonary nodule, seen incidentally on CT neck.   Today, patient reports he feels well. He has not resumed smoking after leaving the hospital. He is not using anything to help him with nicotine cravings. He quit before and was able to stay off cigarettes for 2 months in the past. He is taking eliquis. His only concern is that he feels more tired when he takes his daily mile walk.   ROS: No falls, no focal weakness  Shoulder pain: - used loratab in the past but says was pretty strong - had tried muscle relaxant but also thought was too strong - uses aleve and advil but doesn't help much  - heat helps some - feels like a tightness in upper shoulders/neck - does stretching exercises  Social: - recently moved from [place] and now living with his son - trying to get [employer] housing but not quite sure how to go about this  History of hepatitis C: - Patient says he has received some treatment for this - Have not yet received records from previous PCP  No Known Allergies  Objective: Blood pressure 138/78, pulse (!) 102, temperature 98 F (36.7 C), temperature source Oral, weight 192 lb (87.1 kg), SpO2 97 %. Body mass index is 30.07 kg/m. Constitutional: Obese male, very  pleasant Cardiovascular: Irregularly irregular, no m/r/g.  Pulmonary/Chest: Effort normal and breath sounds normal. No respiratory distress.  Abdominal: Soft. +BS, NT, ND Musculoskeletal: No LE edema. Mildly TTP across upper back. No midline spinal tenderness.  Neurological: AOx3, no focal deficits. Strength intact. Normal gait. Speech fluent.  Skin: Skin is warm and dry. No rash noted.  Psychiatric: Normal mood and affect.  Vitals reviewed  Assessment/Plan: Hospital discharge follow-up - No residual deficits after stroke - Encouraged continued smoking cessation and suggested nicotine patch/lozenge if cravings increase - To ask Cardiologist about starting statin despite low LDL  Upper back pain - Exam consistent with increased muscle tension - Recommended heating pad/topical capsaicin. Sent in Rx for topical diclofenac gel for patient to try rather than regular use of oral NSAIDs.   Housing problems - Provided handout with contact information for [employer]  History of hepatitis C - Obtained ROI from patient and sent for records from last PCP in [place] to determine if he has received treatment or may require ID consult  Follow-up in about 2 months.  Olene Floss, MD [facility], PGY-3

## 2016-09-12 ENCOUNTER — Other Ambulatory Visit (HOSPITAL_COMMUNITY): Payer: Self-pay | Admitting: Interventional Radiology

## 2016-09-12 DIAGNOSIS — I639 Cerebral infarction, unspecified: Secondary | ICD-10-CM

## 2016-09-13 ENCOUNTER — Ambulatory Visit (INDEPENDENT_AMBULATORY_CARE_PROVIDER_SITE_OTHER): Payer: Medicaid Other | Admitting: Physician Assistant

## 2016-09-13 ENCOUNTER — Telehealth: Payer: Self-pay | Admitting: Physician Assistant

## 2016-09-13 ENCOUNTER — Encounter: Payer: Self-pay | Admitting: Physician Assistant

## 2016-09-13 VITALS — BP 112/64 | HR 83 | Ht 69.5 in | Wt 194.4 lb

## 2016-09-13 DIAGNOSIS — Z720 Tobacco use: Secondary | ICD-10-CM | POA: Insufficient documentation

## 2016-09-13 DIAGNOSIS — I509 Heart failure, unspecified: Secondary | ICD-10-CM | POA: Diagnosis not present

## 2016-09-13 DIAGNOSIS — I481 Persistent atrial fibrillation: Secondary | ICD-10-CM

## 2016-09-13 DIAGNOSIS — I63412 Cerebral infarction due to embolism of left middle cerebral artery: Secondary | ICD-10-CM | POA: Diagnosis not present

## 2016-09-13 DIAGNOSIS — B182 Chronic viral hepatitis C: Secondary | ICD-10-CM | POA: Insufficient documentation

## 2016-09-13 DIAGNOSIS — Z95810 Presence of automatic (implantable) cardiac defibrillator: Secondary | ICD-10-CM | POA: Insufficient documentation

## 2016-09-13 DIAGNOSIS — I4819 Other persistent atrial fibrillation: Secondary | ICD-10-CM

## 2016-09-13 DIAGNOSIS — M549 Dorsalgia, unspecified: Secondary | ICD-10-CM | POA: Insufficient documentation

## 2016-09-13 DIAGNOSIS — I10 Essential (primary) hypertension: Secondary | ICD-10-CM

## 2016-09-13 DIAGNOSIS — Z09 Encounter for follow-up examination after completed treatment for conditions other than malignant neoplasm: Secondary | ICD-10-CM | POA: Insufficient documentation

## 2016-09-13 DIAGNOSIS — R079 Chest pain, unspecified: Secondary | ICD-10-CM | POA: Diagnosis not present

## 2016-09-13 DIAGNOSIS — Z599 Problem related to housing and economic circumstances, unspecified: Secondary | ICD-10-CM | POA: Insufficient documentation

## 2016-09-13 HISTORY — DX: Presence of automatic (implantable) cardiac defibrillator: Z95.810

## 2016-09-13 NOTE — Assessment & Plan Note (Signed)
-   Provided handout with contact information for [employer]

## 2016-09-13 NOTE — Patient Instructions (Signed)
Medication Instructions:  Your physician has recommended you make the following change in your medication:  1. Increase lasix (40 mg ) daily for 3 days than go back to (20 mg ) daily   Labwork: -None  Testing/Procedures: -None  Follow-Up: Your physician recommends that you keep your scheduled  follow-up appointment with Dr. [name] on August 1 @ 3:30 pm.   Any Other Special Instructions Will Be Listed Below (If Applicable).   Low-Sodium Eating Plan Sodium, which is an element that makes up salt, helps you maintain a healthy balance of fluids in your body. Too much sodium can increase your blood pressure and cause fluid and waste to be held in your body. Your health care provider or dietitian may recommend following this plan if you have high blood pressure (hypertension), kidney disease, liver disease, or heart failure. Eating less sodium can help lower your blood pressure, reduce swelling, and protect your heart, liver, and kidneys. What are tips for following this plan? General guidelines  Most people on this plan should limit their sodium intake to 1,500-2,000 mg (milligrams) of sodium each day. Reading food labels  The Nutrition Facts label lists the amount of sodium in one serving of the food. If you eat more than one serving, you must multiply the listed amount of sodium by the number of servings.  Choose foods with less than 140 mg of sodium per serving.  Avoid foods with 300 mg of sodium or more per serving. Shopping  Look for lower-sodium products, often labeled as "low-sodium" or "no salt added."  Always check the sodium content even if foods are labeled as "unsalted" or "no salt added".  Buy fresh foods. ? Avoid canned foods and premade or frozen meals. ? Avoid canned, cured, or processed meats  Buy breads that have less than 80 mg of sodium per slice. Cooking  Eat more home-cooked food and less restaurant, buffet, and fast food.  Avoid adding salt when  cooking. Use salt-free seasonings or herbs instead of table salt or sea salt. Check with your health care provider or pharmacist before using salt substitutes.  Cook with plant-based oils, such as canola, sunflower, or olive oil. Meal planning  When eating at a restaurant, ask that your food be prepared with less salt or no salt, if possible.  Avoid foods that contain MSG (monosodium glutamate). MSG is sometimes added to [place] food, bouillon, and some canned foods. What foods are recommended? The items listed may not be a complete list. Talk with your dietitian about what dietary choices are best for you. Grains Low-sodium cereals, including oats, puffed wheat and rice, and shredded wheat. Low-sodium crackers. Unsalted rice. Unsalted pasta. Low-sodium bread. Whole-grain breads and whole-grain pasta. Vegetables Fresh or frozen vegetables. "No salt added" canned vegetables. "No salt added" tomato sauce and paste. Low-sodium or reduced-sodium tomato and vegetable juice. Fruits Fresh, frozen, or canned fruit. Fruit juice. Meats and other protein foods Fresh or frozen (no salt added) meat, poultry, seafood, and fish. Low-sodium canned tuna and salmon. Unsalted nuts. Dried peas, beans, and lentils without added salt. Unsalted canned beans. Eggs. Unsalted nut butters. Dairy Milk. Soy milk. Cheese that is naturally low in sodium, such as ricotta cheese, fresh mozzarella, or Swiss cheese Low-sodium or reduced-sodium cheese. Cream cheese. Yogurt. Fats and oils Unsalted butter. Unsalted margarine with no trans fat. Vegetable oils such as canola or olive oils. Seasonings and other foods Fresh and dried herbs and spices. Salt-free seasonings. Low-sodium mustard and ketchup. Sodium-free salad dressing.  Sodium-free light mayonnaise. Fresh or refrigerated horseradish. Lemon juice. Vinegar. Homemade, reduced-sodium, or low-sodium soups. Unsalted popcorn and pretzels. Low-salt or salt-free chips. What foods  are not recommended? The items listed may not be a complete list. Talk with your dietitian about what dietary choices are best for you. Grains Instant hot cereals. Bread stuffing, pancake, and biscuit mixes. Croutons. Seasoned rice or pasta mixes. Noodle soup cups. Boxed or frozen macaroni and cheese. Regular salted crackers. Self-rising flour. Vegetables Sauerkraut, pickled vegetables, and relishes. Olives. [place] fries. Onion rings. Regular canned vegetables (not low-sodium or reduced-sodium). Regular canned tomato sauce and paste (not low-sodium or reduced-sodium). Regular tomato and vegetable juice (not low-sodium or reduced-sodium). Frozen vegetables in sauces. Meats and other protein foods Meat or fish that is salted, canned, smoked, spiced, or pickled. Bacon, ham, sausage, hotdogs, corned beef, chipped beef, packaged lunch meats, salt pork, jerky, pickled herring, anchovies, regular canned tuna, sardines, salted nuts. Dairy Processed cheese and cheese spreads. Cheese curds. Blue cheese. Feta cheese. String cheese. Regular cottage cheese. Buttermilk. Canned milk. Fats and oils Salted butter. Regular margarine. Ghee. Bacon fat. Seasonings and other foods Onion salt, garlic salt, seasoned salt, table salt, and sea salt. Canned and packaged gravies. Worcestershire sauce. Tartar sauce. Barbecue sauce. Teriyaki sauce. Soy sauce, including reduced-sodium. Steak sauce. Fish sauce. Oyster sauce. Cocktail sauce. Horseradish that you find on the shelf. Regular ketchup and mustard. Meat flavorings and tenderizers. Bouillon cubes. Hot sauce and Tabasco sauce. Premade or packaged marinades. Premade or packaged taco seasonings. Relishes. Regular salad dressings. Salsa. Potato and tortilla chips. Corn chips and puffs. Salted popcorn and pretzels. Canned or dried soups. Pizza. Frozen entrees and pot pies. Summary  Eating less sodium can help lower your blood pressure, reduce swelling, and protect your heart,  liver, and kidneys.  Most people on this plan should limit their sodium intake to 1,500-2,000 mg (milligrams) of sodium each day.  Canned, boxed, and frozen foods are high in sodium. Restaurant foods, fast foods, and pizza are also very high in sodium. You also get sodium by adding salt to food.  Try to cook at home, eat more fresh fruits and vegetables, and eat less fast food, canned, processed, or prepared foods. This information is not intended to replace advice given to you by your health care provider. Make sure you discuss any questions you have with your health care provider. Document Released: 07/28/2001 Document Revised: 01/30/2016 Document Reviewed: 01/30/2016 Elsevier Interactive Patient Education  2017 [employer].    If you need a refill on your cardiac medications before your next appointment, please call your pharmacy.

## 2016-09-13 NOTE — Assessment & Plan Note (Signed)
-   No residual deficits after stroke - Encouraged continued smoking cessation and suggested nicotine patch/lozenge if cravings increase - To ask Cardiologist about starting statin despite low LDL

## 2016-09-13 NOTE — Telephone Encounter (Signed)
Release of Information faxed to [facility] [place] [facility].

## 2016-09-13 NOTE — Assessment & Plan Note (Signed)
-   Obtained ROI from patient and sent for records from last PCP in [place] to determine if he has received treatment or may require ID consult

## 2016-09-13 NOTE — Assessment & Plan Note (Signed)
-   Exam consistent with increased muscle tension - Recommended heating pad/topical capsaicin. Sent in Rx for topical diclofenac gel for patient to try rather than regular use of oral NSAIDs.

## 2016-09-13 NOTE — Progress Notes (Signed)
Cardiology Office Note    Date:  09/13/2016   ID:  Glenn [name], DOB 09-04-1950, MRN [id number]  PCP:  Rogue Bussing, MD  Cardiologist:New-to be established with Dr. [name]  Chief Complaint  Patient presents with  . New Patient (Initial Visit)    History of Present Illness:  Glenn [name] is a 66 y.o. male with history of atrial fibrillation on Xarelto with ICD, CHF, hepatitis C and hypertension who presented to [facility] with left MCA CVA. He did not receive TPA due to arriving outside the treatment window and being on Xarelto. He underwent chemical thrombectomy for left M2 occlusion. Patient was switched to Eliquis for secondary stroke prevention and aspirin 81 mg was added given his cardiac history. 2-D echo in the hospital LVEF 30-35%.  Patient moved here from [place] in May. He has a 2 year history of Atrial fibrillation and had an ICD-[employer] 12/2015 put in. Says he had a heart cath that showed no blockages but he had CHF. Smokes 1 ppd 30-40 yrs. Says he had chest tightness and shortness of breath for about a week prior to CVA. Also missed a couple of Xarelto doses.Has had some chest tightness and shortness of breath since he's been home. May have gotten extra salt in his diet. Hasn't seen cardiologist in 6 months. Defibrillator went off 8 months ago and he was hospitalized in [place].    Past Medical History:  Diagnosis Date  . CHF (congestive heart failure) ([place])   . Hepatitis C   . Hypertension     Past Surgical History:  Procedure Laterality Date  . CARDIAC DEFIBRILLATOR PLACEMENT  2015  . EYE SURGERY Left 1990  . IR PERCUTANEOUS ART THROMBECTOMY/INFUSION INTRACRANIAL INC DIAG ANGIO  09/05/2016  . RADIOLOGY WITH ANESTHESIA N/A 09/05/2016   Procedure: RADIOLOGY WITH ANESTHESIA;  Surgeon: Luanne Bras, MD;  Location: [facility];  Service: Radiology;  Laterality: N/A;    Current Medications: Current Meds  Medication Sig  . apixaban (ELIQUIS) 5  MG TABS tablet Take 1 tablet (5 mg total) by mouth 2 (two) times daily.  Marland Kitchen aspirin EC 81 MG tablet Take 81 mg by mouth daily.  . busPIRone (BUSPAR) 7.5 MG tablet Take 7.5 mg by mouth daily as needed for depression.  . carvedilol (COREG) 25 MG tablet Take 1 tablet (25 mg total) by mouth 2 (two) times daily with a meal.  . Cholecalciferol (VITAMIN D3) 1000 units CAPS Take 1,000 Units by mouth daily.  . diclofenac sodium (VOLTAREN) 1 % GEL Apply to affected area as needed twice daily.  . Fluticasone-Salmeterol (ADVAIR) 250-50 MCG/DOSE AEPB Inhale 1 puff into the lungs 2 (two) times daily.  . furosemide (LASIX) 20 MG tablet Take 1 tablet (20 mg total) by mouth daily.  . hydrALAZINE (APRESOLINE) 25 MG tablet Take 25 mg by mouth 2 (two) times daily.  . isosorbide mononitrate (ISMO,MONOKET) 10 MG tablet Take 10 mg by mouth daily.  Marland Kitchen lisinopril (PRINIVIL,ZESTRIL) 5 MG tablet Take 5 mg by mouth daily.  Marland Kitchen omeprazole (PRILOSEC) 20 MG capsule Take 20 mg by mouth daily.  Marland Kitchen thiamine (VITAMIN B-1) 100 MG tablet Take 100 mg by mouth daily.     Allergies:   Patient has no known allergies.   Social History   Social History  . Marital status: Single    Spouse name: N/A  . Number of children: N/A  . Years of education: 77 (some college)   Occupational History  . disability  Social History Main Topics  . Smoking status: Former Smoker    Packs/day: 0.50    Types: Cigarettes  . Smokeless tobacco: Never Used     Comment: pt stated he quit smoking 1 week ago  . Alcohol use 1.8 oz/week    3 Cans of beer per week  . Drug use: Yes    Frequency: 2.0 times per week    Types: Marijuana  . Sexual activity: Yes    Partners: Female    Birth control/ protection: Condom   Other Topics Concern  . None   Social History Narrative  . None     Family History:  The patient has no family history of CAD   ROS:   Please see the history of present illness.    Review of Systems  Constitution: Negative.    HENT: Negative.   Cardiovascular: Positive for chest pain, dyspnea on exertion, irregular heartbeat and leg swelling.  Respiratory: Positive for cough.   Endocrine: Negative.   Hematologic/Lymphatic: Negative.   Musculoskeletal: Positive for myalgias and neck pain.  Gastrointestinal: Negative.   Genitourinary: Negative.   Neurological: Positive for headaches.   All other systems reviewed and are negative.   PHYSICAL EXAM:   VS:  BP 112/64 (BP Location: Right Arm)   Pulse 83   Ht 5' 9.5" (1.765 m)   Wt 194 lb 6.4 oz (88.2 kg)   BMI 28.30 kg/m   Physical Exam  GEN: Well nourished, well developed, in no acute distress Neck: increased JVD, no carotid bruits, or masses Cardiac:irreg irreg; 2/6 systolic murmur left sternal border, 1/6 diastolic murmur Respiratory:  Decreased breath sounds throughout, GI: soft, nontender, nondistended, + BS EXB:MWUX 1 edema  without cyanosis, clubbing, or edema, Good distal pulses bilaterally Neuro:  Alert and Oriented x 3, Strength and sensation are intact Psych: euthymic mood, full affect  Wt Readings from Last 3 Encounters:  09/13/16 194 lb 6.4 oz (88.2 kg)  09/11/16 192 lb (87.1 kg)  09/06/16 182 lb 15.7 oz (83 kg)      Studies/Labs Reviewed:   EKG:  EKG is ordered today.  The ekg ordered today demonstrates A lateral T-wave inversion  Recent Labs: 09/05/2016: ALT 33 09/07/2016: BUN 7; Creatinine, Ser 0.99; Hemoglobin 10.4; Magnesium 1.7; Platelets 144; Potassium 3.5; Sodium 138   Lipid Panel    Component Value Date/Time   CHOL 96 09/06/2016 0500   TRIG 128 09/06/2016 0500   HDL 26 (L) 09/06/2016 0500   CHOLHDL 3.7 09/06/2016 0500   VLDL 26 09/06/2016 0500   LDLCALC 44 09/06/2016 0500    Additional studies/ records that were reviewed today include:    TTE - Left ventricle: The cavity size was normal. Systolic function was   moderately to severely reduced. The estimated ejection fraction   was in the range of 30% to 35%. Diffuse  hypokinesis. Moderate   hypokinesis of the anteroseptal, inferior, and inferoseptal   myocardium. - Aortic valve: Transvalvular velocity was within the normal range.   There was no stenosis. There was moderate regurgitation. Valve   area (VTI): 2.25 cm^2. Valve area (Vmax): 2.37 cm^2. Valve area   (Vmean): 2.34 cm^2. - Mitral valve: Transvalvular velocity was within the normal range.   There was no evidence for stenosis. There was mild regurgitation. - Left atrium: The atrium was moderately dilated. - Right ventricle: The cavity size was normal. Wall thickness was   normal. Systolic function was mildly reduced. - Right atrium: The atrium was moderately dilated. -  Tricuspid valve: There was mild-moderate regurgitation. - Pulmonary arteries: Systolic pressure was mildly increased. PA   peak pressure: 38 mm Hg (S).        ASSESSMENT:    1. Congestive heart failure, unspecified HF chronicity, unspecified heart failure type ([place])   2. Persistent atrial fibrillation ([place])   3. ICD (implantable cardioverter-defibrillator) in place   4. Cerebrovascular accident (CVA) due to embolism of left middle cerebral artery ([place])   5. Essential hypertension   6. Chest pain, unspecified type   7. Tobacco abuse      PLAN:  In order of problems listed above:  Acute on chronic systolic CHF ejection fraction 30-35% with some fluid overload on exam today. Increase Lasix to 40 mg daily for 3 days then back to 20 mg daily. 2 g sodium diet. Patient has a cardiomyopathy question ischemic. He has wall motion abnormalities on echo. Patient states he had a normal cath 2 years ago but we are trying to get records from [place]. He is seen as a new patient today but will be seeing Dr. [name] next week to become established. Discussed this with Dr.McAlhany who concurs.  Chronic atrial fibrillation with controlled rate on carvedilol and now Eliquis and ASA. Was on Xarelto but switched by neurologist after  CVA.  ICD implanted 2 years ago [employer] it shocked him a months ago and he was hospitalized in [place]. Hasn't been checked in 6 months. Has an appointment next week here for device clinic.   Left MCA CVA felt secondary to atrial fibrillation status post mechanical thrombectomy with complete recanalization an excellent clinical recovery. Patient states he did miss a couple Xarelto doses prior to his stroke. Now on Eliquis and ASA.  Essential hypertension well controlled  Chest tightness before and after his stroke seems to be in the setting of CHF. Patient states he had a normal cardiac cath 2 years ago. We'll give him extra Lasix, obtain his records and allow Dr. [name] to recommend any further workup if indicated.  Tobacco abuse patient has smoked for 35-40 years. Recommend smoking cessation. Patient says he is going to quit. Medication Adjustments/Labs and Tests Ordered: Current medicines are reviewed at length with the patient today.  Concerns regarding medicines are outlined above.  Medication changes, Labs and Tests ordered today are listed in the Patient Instructions below. Patient Instructions  Medication Instructions:  Your physician has recommended you make the following change in your medication:  1. Increase lasix (40 mg ) daily for 3 days than go back to (20 mg ) daily   Labwork: -None  Testing/Procedures: -None  Follow-Up: Your physician recommends that you keep your scheduled  follow-up appointment with Dr. [name] on August 1 @ 3:30 pm.   Any Other Special Instructions Will Be Listed Below (If Applicable).   Low-Sodium Eating Plan Sodium, which is an element that makes up salt, helps you maintain a healthy balance of fluids in your body. Too much sodium can increase your blood pressure and cause fluid and waste to be held in your body. Your health care provider or dietitian may recommend following this plan if you have high blood pressure  (hypertension), kidney disease, liver disease, or heart failure. Eating less sodium can help lower your blood pressure, reduce swelling, and protect your heart, liver, and kidneys. What are tips for following this plan? General guidelines  Most people on this plan should limit their sodium intake to 1,500-2,000 mg (milligrams) of sodium each  day. Reading food labels  The Nutrition Facts label lists the amount of sodium in one serving of the food. If you eat more than one serving, you must multiply the listed amount of sodium by the number of servings.  Choose foods with less than 140 mg of sodium per serving.  Avoid foods with 300 mg of sodium or more per serving. Shopping  Look for lower-sodium products, often labeled as "low-sodium" or "no salt added."  Always check the sodium content even if foods are labeled as "unsalted" or "no salt added".  Buy fresh foods. ? Avoid canned foods and premade or frozen meals. ? Avoid canned, cured, or processed meats  Buy breads that have less than 80 mg of sodium per slice. Cooking  Eat more home-cooked food and less restaurant, buffet, and fast food.  Avoid adding salt when cooking. Use salt-free seasonings or herbs instead of table salt or sea salt. Check with your health care provider or pharmacist before using salt substitutes.  Cook with plant-based oils, such as canola, sunflower, or olive oil. Meal planning  When eating at a restaurant, ask that your food be prepared with less salt or no salt, if possible.  Avoid foods that contain MSG (monosodium glutamate). MSG is sometimes added to [place] food, bouillon, and some canned foods. What foods are recommended? The items listed may not be a complete list. Talk with your dietitian about what dietary choices are best for you. Grains Low-sodium cereals, including oats, puffed wheat and rice, and shredded wheat. Low-sodium crackers. Unsalted rice. Unsalted pasta. Low-sodium bread.  Whole-grain breads and whole-grain pasta. Vegetables Fresh or frozen vegetables. "No salt added" canned vegetables. "No salt added" tomato sauce and paste. Low-sodium or reduced-sodium tomato and vegetable juice. Fruits Fresh, frozen, or canned fruit. Fruit juice. Meats and other protein foods Fresh or frozen (no salt added) meat, poultry, seafood, and fish. Low-sodium canned tuna and salmon. Unsalted nuts. Dried peas, beans, and lentils without added salt. Unsalted canned beans. Eggs. Unsalted nut butters. Dairy Milk. Soy milk. Cheese that is naturally low in sodium, such as ricotta cheese, fresh mozzarella, or Swiss cheese Low-sodium or reduced-sodium cheese. Cream cheese. Yogurt. Fats and oils Unsalted butter. Unsalted margarine with no trans fat. Vegetable oils such as canola or olive oils. Seasonings and other foods Fresh and dried herbs and spices. Salt-free seasonings. Low-sodium mustard and ketchup. Sodium-free salad dressing. Sodium-free light mayonnaise. Fresh or refrigerated horseradish. Lemon juice. Vinegar. Homemade, reduced-sodium, or low-sodium soups. Unsalted popcorn and pretzels. Low-salt or salt-free chips. What foods are not recommended? The items listed may not be a complete list. Talk with your dietitian about what dietary choices are best for you. Grains Instant hot cereals. Bread stuffing, pancake, and biscuit mixes. Croutons. Seasoned rice or pasta mixes. Noodle soup cups. Boxed or frozen macaroni and cheese. Regular salted crackers. Self-rising flour. Vegetables Sauerkraut, pickled vegetables, and relishes. Olives. [place] fries. Onion rings. Regular canned vegetables (not low-sodium or reduced-sodium). Regular canned tomato sauce and paste (not low-sodium or reduced-sodium). Regular tomato and vegetable juice (not low-sodium or reduced-sodium). Frozen vegetables in sauces. Meats and other protein foods Meat or fish that is salted, canned, smoked, spiced, or pickled.  Bacon, ham, sausage, hotdogs, corned beef, chipped beef, packaged lunch meats, salt pork, jerky, pickled herring, anchovies, regular canned tuna, sardines, salted nuts. Dairy Processed cheese and cheese spreads. Cheese curds. Blue cheese. Feta cheese. String cheese. Regular cottage cheese. Buttermilk. Canned milk. Fats and oils Salted butter. Regular margarine. Ghee. Berniece Salines  fat. Seasonings and other foods Onion salt, garlic salt, seasoned salt, table salt, and sea salt. Canned and packaged gravies. Worcestershire sauce. Tartar sauce. Barbecue sauce. Teriyaki sauce. Soy sauce, including reduced-sodium. Steak sauce. Fish sauce. Oyster sauce. Cocktail sauce. Horseradish that you find on the shelf. Regular ketchup and mustard. Meat flavorings and tenderizers. Bouillon cubes. Hot sauce and Tabasco sauce. Premade or packaged marinades. Premade or packaged taco seasonings. Relishes. Regular salad dressings. Salsa. Potato and tortilla chips. Corn chips and puffs. Salted popcorn and pretzels. Canned or dried soups. Pizza. Frozen entrees and pot pies. Summary  Eating less sodium can help lower your blood pressure, reduce swelling, and protect your heart, liver, and kidneys.  Most people on this plan should limit their sodium intake to 1,500-2,000 mg (milligrams) of sodium each day.  Canned, boxed, and frozen foods are high in sodium. Restaurant foods, fast foods, and pizza are also very high in sodium. You also get sodium by adding salt to food.  Try to cook at home, eat more fresh fruits and vegetables, and eat less fast food, canned, processed, or prepared foods. This information is not intended to replace advice given to you by your health care provider. Make sure you discuss any questions you have with your health care provider. Document Released: 07/28/2001 Document Revised: 01/30/2016 Document Reviewed: 01/30/2016 Elsevier Interactive Patient Education  2017 [employer].    If you need a refill on  your cardiac medications before your next appointment, please call your pharmacy.      Sumner Boast, PA-C  09/13/2016 10:05 AM    [facility] [address], [place], [place]  [address] Phone: [phone]; Fax: [phone]

## 2016-09-19 ENCOUNTER — Ambulatory Visit (INDEPENDENT_AMBULATORY_CARE_PROVIDER_SITE_OTHER): Payer: Medicaid Other | Admitting: Internal Medicine

## 2016-09-19 ENCOUNTER — Encounter: Payer: Self-pay | Admitting: Internal Medicine

## 2016-09-19 VITALS — BP 138/70 | HR 91 | Ht 69.0 in | Wt 191.0 lb

## 2016-09-19 DIAGNOSIS — I481 Persistent atrial fibrillation: Secondary | ICD-10-CM

## 2016-09-19 DIAGNOSIS — I509 Heart failure, unspecified: Secondary | ICD-10-CM | POA: Diagnosis not present

## 2016-09-19 DIAGNOSIS — I63412 Cerebral infarction due to embolism of left middle cerebral artery: Secondary | ICD-10-CM

## 2016-09-19 DIAGNOSIS — I4819 Other persistent atrial fibrillation: Secondary | ICD-10-CM

## 2016-09-19 DIAGNOSIS — Z95810 Presence of automatic (implantable) cardiac defibrillator: Secondary | ICD-10-CM

## 2016-09-19 DIAGNOSIS — I10 Essential (primary) hypertension: Secondary | ICD-10-CM | POA: Diagnosis not present

## 2016-09-19 MED ORDER — FUROSEMIDE 40 MG PO TABS: 40.0000 mg | ORAL_TABLET | Freq: Every day | ORAL | 6 refills | Status: DC

## 2016-09-19 MED ORDER — DILTIAZEM HCL ER COATED BEADS 120 MG PO CP24: 120.0000 mg | ORAL_CAPSULE | Freq: Every day | ORAL | 6 refills | Status: DC

## 2016-09-19 NOTE — Patient Instructions (Signed)
Medication Instructions: - Your physician has recommended you make the following change in your medication:  1) Stop aspirin 2) Start diltiazem 120 mg- take 1 capsule by mouth once daily 3) Increase lasix (furosemide) to 40 mg- take 1 tablet by mouth once daily  Labwork: - none ordered  Procedures/Testing: - none ordered  Follow-Up: - Your physician recommends that you schedule a follow-up appointment in: 2 weeks with Glenn Husk, PA   Any Additional Special Instructions Will Be Listed Below (If Applicable).     If you need a refill on your cardiac medications before your next appointment, please call your pharmacy.

## 2016-09-19 NOTE — Progress Notes (Signed)
ELECTROPHYSIOLOGY CONSULT NOTE  Patient ID: Glenn [name], MRN: [id number], DOB/AGE: 05-25-1950 66 y.o. Admit date: (Not on file) Date of Consult: 09/19/2016  Primary Physician: Rogue Bussing, MD Primary Cardiologist: new     Glenn [name] is a 66 y.o. male who is being seen To establish ICD care.    HPI  Glenn [name] is a 67 y.o. male  Seen to establish ICD care.  His history is not clear. He describes presenting to the hospital a couple of years ago with irregular heartbeat. He was put on anticoagulation so presumably was in atrial fibrillation. A couple of months later he was given a defibrillator. He does not recall ever having been cardioverted. He does not recall a catheterization. He has been anticoagulated with apixoban and aspirin.  His atrial fibrillation presumably has been permanent. He was shocked 5/17 multiple times for atrial fibrillation with a rapid rate.  [facility] was recently [facility] following a stroke to his left MCA. It was treated with a thrombectomy. There has been gradual recovery.  He has chronic shortness of breath. This is true at less than 100 yards. He has orthopnea nocturnal dyspnea. He has had peripheral edema. This improved with up titration of his diuretics. Anticipated down titration was associated with worsening symptoms and again was on has increased his Lasix again. He is aware of the need for salt restriction and fluid restriction.    Echocardiogram 7/18 demonstrated EF 30-35% moderate AI and moderate biatrial enlargement (LAE 44/3.2/47)   Past Medical History:  Diagnosis Date  . CHF (congestive heart failure) ([place])   . Hepatitis C   . Hypertension       Surgical History:  Past Surgical History:  Procedure Laterality Date  . CARDIAC DEFIBRILLATOR PLACEMENT  2015  . EYE SURGERY Left 1990  . IR PERCUTANEOUS ART THROMBECTOMY/INFUSION INTRACRANIAL INC DIAG ANGIO  09/05/2016  . RADIOLOGY WITH ANESTHESIA N/A  09/05/2016   Procedure: RADIOLOGY WITH ANESTHESIA;  Surgeon: Luanne Bras, MD;  Location: [facility];  Service: Radiology;  Laterality: N/A;     Home Meds: Prior to Admission medications   Medication Sig Start Date End Date Taking? Authorizing Provider  apixaban (ELIQUIS) 5 MG TABS tablet Take 1 tablet (5 mg total) by mouth 2 (two) times daily. 09/08/16  Yes Rinehuls, Early Chars, PA-C  aspirin EC 81 MG tablet Take 81 mg by mouth daily.   Yes [provider]  busPIRone (BUSPAR) 7.5 MG tablet Take 7.5 mg by mouth daily as needed for depression. 07/09/16  Yes [provider]  carvedilol (COREG) 25 MG tablet Take 1 tablet (25 mg total) by mouth 2 (two) times daily with a meal. 09/08/16  Yes Rinehuls, Early Chars, PA-C  Cholecalciferol (VITAMIN D3) 1000 units CAPS Take 1,000 Units by mouth daily.   Yes [provider]  diclofenac sodium (VOLTAREN) 1 % GEL Apply to affected area as needed twice daily. 09/11/16  Yes Rogue Bussing, MD  Fluticasone-Salmeterol (ADVAIR) 250-50 MCG/DOSE AEPB Inhale 1 puff into the lungs 2 (two) times daily.   Yes [provider]  furosemide (LASIX) 20 MG tablet Take 1 tablet (20 mg total) by mouth daily. 08/14/16  Yes Bacigalupo, Dionne Bucy, MD  hydrALAZINE (APRESOLINE) 25 MG tablet Take 25 mg by mouth 2 (two) times daily.   Yes [provider]  isosorbide mononitrate (ISMO,MONOKET) 10 MG tablet Take 10 mg by mouth daily.   Yes [provider]  lisinopril (PRINIVIL,ZESTRIL) 5 MG  tablet Take 5 mg by mouth daily.   Yes [provider]  omeprazole (PRILOSEC) 20 MG capsule Take 20 mg by mouth daily.   Yes [provider]  thiamine (VITAMIN B-1) 100 MG tablet Take 100 mg by mouth daily.   Yes [provider]    Allergies: No Known Allergies  Social History   Social History  . Marital status: Single    Spouse name: N/A  . Number of children: N/A  . Years of education: 73 (some college)    Occupational History  . disability    Social History Main Topics  . Smoking status: Former Smoker    Packs/day: 0.50    Types: Cigarettes  . Smokeless tobacco: Never Used     Comment: pt stated he quit smoking 1 week ago  . Alcohol use 1.8 oz/week    3 Cans of beer per week  . Drug use: Yes    Frequency: 2.0 times per week    Types: Marijuana  . Sexual activity: Yes    Partners: Female    Birth control/ protection: Condom   Other Topics Concern  . Not on file   Social History Narrative  . No narrative on file     Family History  Problem Relation Age of Onset  . Heart disease Neg Hx      ROS:  Please see the history of present illness.     All other systems reviewed and negative.    Physical Exam: Blood pressure 138/70, pulse 91, height 5\' 9"  (1.753 m), weight 191 lb (86.6 kg), SpO2 98 %. General: Well developed, well nourished male in no acute distress. Head: Normocephalic, atraumatic, sclera non-icteric, no xanthomas, nares are without discharge. EENT: normal  Lymph Nodes:  none Neck: Negative for carotid bruits. JVD 8-9 cm  Back:without scoliosis kyphosis Lungs: Clear bilaterally to auscultation without wheezes, rales, or rhonchi. Breathing is unlabored. Heart: Irregularly irregular rhythm with a 2/6 systolic murmur . No rubs, or gallops appreciated. Abdomen: Soft, non-tender, non-distended with normoactive bowel sounds. No hepatomegaly. No rebound/guarding. No obvious abdominal masses. Msk:  Strength and tone appear normal for age. Extremities: No clubbing or cyanosis.  1+  edema.  Distal pedal pulses are 2+ and equal bilaterally. Skin: Warm and Dry Neuro: Alert and oriented X 3. CN III-XII intact Grossly normal sensory and motor function . Psych:  Responds to questions appropriately with a normal affect.      Labs: Cardiac Enzymes No results for input(s): CKTOTAL, CKMB, TROPONINI in the last 72 hours. CBC Lab Results  Component Value Date   WBC 5.8  09/07/2016   HGB 10.4 (L) 09/07/2016   HCT 31.8 (L) 09/07/2016   MCV 97.8 09/07/2016   PLT 144 (L) 09/07/2016   PROTIME: No results for input(s): LABPROT, INR in the last 72 hours. Chemistry No results for input(s): NA, K, CL, CO2, BUN, CREATININE, CALCIUM, PROT, BILITOT, ALKPHOS, ALT, AST, GLUCOSE in the last 168 hours.  Invalid input(s): LABALBU Lipids Lab Results  Component Value Date   CHOL 96 09/06/2016   HDL 26 (L) 09/06/2016   LDLCALC 44 09/06/2016   TRIG 128 09/06/2016   BNP No results found for: PROBNP Thyroid Function Tests: No results for input(s): TSH, T4TOTAL, T3FREE, THYROIDAB in the last 72 hours.  Invalid input(s): FREET3 Miscellaneous No results found for: DDIMER  Radiology/Studies:  Ct Angio Head W Or Wo Contrast  Result Date: 09/05/2016 EXAM: CT ANGIOGRAPHY HEAD AND NECK CT PERFUSION BRAIN TECHNIQUE: Multidetector  CT imaging of the head and neck was performed using the standard protocol during bolus administration of intravenous contrast. Multiplanar CT image reconstructions and MIPs were obtained to evaluate the vascular anatomy. Carotid stenosis measurements (when applicable) are obtained utilizing NASCET criteria, using the distal internal carotid diameter as the denominator. Multiphase CT imaging of the brain was performed following IV bolus contrast injection. Subsequent parametric perfusion maps were calculated using RAPID software. COMPARISON:  None. FINDINGS: CTA NECK FINDINGS Aortic arch: Bovine arch. Imaged portion shows no evidence of aneurysm or dissection. No significant stenosis of the major arch vessel origins. Minimal calcific atherosclerosis of the aortic arch. Right carotid system: No evidence of dissection, stenosis (50% or greater) or occlusion. Moderate calcified plaque of the carotid bifurcation with minimal less than 30% proximal ICA stenosis. Left carotid system: No evidence of dissection, stenosis (50% or greater) or occlusion. Mild calcified  plaque of the bifurcation. Vertebral arteries: Codominant. No evidence of dissection, stenosis (50% or greater) or occlusion. Skeleton: Moderate cervical degenerative changes with upper cervical facet arthrosis greater on the right and discogenic degenerative disease greatest at the C4 through C7 levels. No high-grade bony canal stenosis. Other neck: Negative. Upper chest: 5 mm nodule in the right upper lobe (series 8, image 347). Review of the MIP images confirms the above findings CTA HEAD FINDINGS Anterior circulation: Left MCA superior division origin occlusion with intermediate collateralization. The point of occlusion is in the left anterior sylvian fissure corresponding to the hyperdensity on prior noncontrast CT of the head (series 10, image 147) and at the branching origin of the inferior division which makes an approximately 90 degrees angle downward turn relative to the M1 (series 13, image 31). Otherwise the anterior circulation is patent without high-grade stenosis, aneurysm, or occlusion. Calcified plaque of the carotid siphons with mild right-sided distal cavernous stenosis. Posterior circulation: No significant stenosis, proximal occlusion, aneurysm, or vascular malformation. Venous sinuses: As permitted by contrast timing, patent. Anatomic variants: Right larger than left posterior communicating artery is and diminutive patent anterior communicating artery. Review of the MIP images confirms the above findings CT Brain Perfusion Findings: CBF (<30%) Volume: 65mL Perfusion (Tmax>6.0s) volume: 140mL Mismatch Volume: 111mL Infarction Location:Perfusion anomaly is centered in the left lateral frontal and parietal lobes. IMPRESSION: CT perfusion: No infarct core (CBF less than 30%) by automated RAPID processing. At risk ischemia in the left superior MCA distribution with volume 108 cc. CTA head: 1. Left MCA superior division origin occlusion with intermediate collateralization. 2. Otherwise patent circle  of Willis without significant stenosis or aneurysm identified. CTA neck: 1. Patent carotid and vertebral arteries. No dissection, aneurysm, or hemodynamically significant stenosis is identified. 2. 5 mm right upper lobe pulmonary nodule. No follow-up needed if patient is low-risk. Non-contrast chest CT can be considered in 12 months if patient is high-risk. This recommendation follows the consensus statement: Guidelines for Management of Incidental Pulmonary Nodules Detected on CT Images: From the Fleischner Society 2017; Radiology 2017; 284:228-243. These results were called by telephone at the time of interpretation on 09/05/2016 at 9:09 pm to Dr. Cheral Marker, who verbally acknowledged these results. Electronically Signed   By: Kristine Garbe M.D.   On: 09/05/2016 21:26   Ct Head Wo Contrast  Result Date: 09/07/2016 CLINICAL DATA:  Stroke follow-up EXAM: CT HEAD WITHOUT CONTRAST TECHNIQUE: Contiguous axial images were obtained from the base of the skull through the vertex without intravenous contrast. COMPARISON:  Head CT 09/05/2016 FINDINGS: Brain: No mass lesion, intraparenchymal hemorrhage or extra-axial  collection. No evidence of acute cortical infarct. Brain parenchyma and CSF-containing spaces are normal for age. Vascular: No hyperdense vessel or unexpected calcification. Skull: Normal visualized skull base, calvarium and extracranial soft tissues. Sinuses/Orbits: No sinus fluid levels or advanced mucosal thickening. No mastoid effusion. Normal orbits. IMPRESSION: Unchanged examination without acute intracranial abnormality. Gray-white differentiation remains distinct. Electronically Signed   By: Ulyses Jarred M.D.   On: 09/07/2016 05:23   Ct Head Wo Contrast  Result Date: 09/06/2016 CLINICAL DATA:  Status post neurovascular intervention. EXAM: CT HEAD WITHOUT CONTRAST TECHNIQUE: Contiguous axial images were obtained from the base of the skull through the vertex without intravenous contrast.  COMPARISON:  CTA head neck 09/05/2016 CTP head 09/05/2016 FINDINGS: Brain: No mass lesion, intraparenchymal hemorrhage or extra-axial collection. No evidence of acute cortical infarct. There is periventricular hypoattenuation compatible with chronic microvascular disease. Vascular: No hyperdense vessel or unexpected calcification. Skull: Normal visualized skull base, calvarium and extracranial soft tissues. Sinuses/Orbits: No sinus fluid levels or advanced mucosal thickening. No mastoid effusion. Normal orbits. IMPRESSION: No hemorrhage or other acute abnormality following neurovascular intervention. Gray-white differentiation is preserved in the region of ischemia demonstrated on the earlier perfusion study. Electronically Signed   By: Ulyses Jarred M.D.   On: 09/06/2016 00:30   Ct Angio Neck W And/or Wo Contrast  Result Date: 09/05/2016 EXAM: CT ANGIOGRAPHY HEAD AND NECK CT PERFUSION BRAIN TECHNIQUE: Multidetector CT imaging of the head and neck was performed using the standard protocol during bolus administration of intravenous contrast. Multiplanar CT image reconstructions and MIPs were obtained to evaluate the vascular anatomy. Carotid stenosis measurements (when applicable) are obtained utilizing NASCET criteria, using the distal internal carotid diameter as the denominator. Multiphase CT imaging of the brain was performed following IV bolus contrast injection. Subsequent parametric perfusion maps were calculated using RAPID software. COMPARISON:  None. FINDINGS: CTA NECK FINDINGS Aortic arch: Bovine arch. Imaged portion shows no evidence of aneurysm or dissection. No significant stenosis of the major arch vessel origins. Minimal calcific atherosclerosis of the aortic arch. Right carotid system: No evidence of dissection, stenosis (50% or greater) or occlusion. Moderate calcified plaque of the carotid bifurcation with minimal less than 30% proximal ICA stenosis. Left carotid system: No evidence of  dissection, stenosis (50% or greater) or occlusion. Mild calcified plaque of the bifurcation. Vertebral arteries: Codominant. No evidence of dissection, stenosis (50% or greater) or occlusion. Skeleton: Moderate cervical degenerative changes with upper cervical facet arthrosis greater on the right and discogenic degenerative disease greatest at the C4 through C7 levels. No high-grade bony canal stenosis. Other neck: Negative. Upper chest: 5 mm nodule in the right upper lobe (series 8, image 347). Review of the MIP images confirms the above findings CTA HEAD FINDINGS Anterior circulation: Left MCA superior division origin occlusion with intermediate collateralization. The point of occlusion is in the left anterior sylvian fissure corresponding to the hyperdensity on prior noncontrast CT of the head (series 10, image 147) and at the branching origin of the inferior division which makes an approximately 90 degrees angle downward turn relative to the M1 (series 13, image 31). Otherwise the anterior circulation is patent without high-grade stenosis, aneurysm, or occlusion. Calcified plaque of the carotid siphons with mild right-sided distal cavernous stenosis. Posterior circulation: No significant stenosis, proximal occlusion, aneurysm, or vascular malformation. Venous sinuses: As permitted by contrast timing, patent. Anatomic variants: Right larger than left posterior communicating artery is and diminutive patent anterior communicating artery. Review of the MIP images confirms the above  findings CT Brain Perfusion Findings: CBF (<30%) Volume: 47mL Perfusion (Tmax>6.0s) volume: 164mL Mismatch Volume: 185mL Infarction Location:Perfusion anomaly is centered in the left lateral frontal and parietal lobes. IMPRESSION: CT perfusion: No infarct core (CBF less than 30%) by automated RAPID processing. At risk ischemia in the left superior MCA distribution with volume 108 cc. CTA head: 1. Left MCA superior division origin  occlusion with intermediate collateralization. 2. Otherwise patent circle of Willis without significant stenosis or aneurysm identified. CTA neck: 1. Patent carotid and vertebral arteries. No dissection, aneurysm, or hemodynamically significant stenosis is identified. 2. 5 mm right upper lobe pulmonary nodule. No follow-up needed if patient is low-risk. Non-contrast chest CT can be considered in 12 months if patient is high-risk. This recommendation follows the consensus statement: Guidelines for Management of Incidental Pulmonary Nodules Detected on CT Images: From the Fleischner Society 2017; Radiology 2017; 284:228-243. These results were called by telephone at the time of interpretation on 09/05/2016 at 9:09 pm to Dr. Cheral Marker, who verbally acknowledged these results. Electronically Signed   By: Kristine Garbe M.D.   On: 09/05/2016 21:26   Ct Cerebral Perfusion W Contrast  Result Date: 09/05/2016 EXAM: CT ANGIOGRAPHY HEAD AND NECK CT PERFUSION BRAIN TECHNIQUE: Multidetector CT imaging of the head and neck was performed using the standard protocol during bolus administration of intravenous contrast. Multiplanar CT image reconstructions and MIPs were obtained to evaluate the vascular anatomy. Carotid stenosis measurements (when applicable) are obtained utilizing NASCET criteria, using the distal internal carotid diameter as the denominator. Multiphase CT imaging of the brain was performed following IV bolus contrast injection. Subsequent parametric perfusion maps were calculated using RAPID software. COMPARISON:  None. FINDINGS: CTA NECK FINDINGS Aortic arch: Bovine arch. Imaged portion shows no evidence of aneurysm or dissection. No significant stenosis of the major arch vessel origins. Minimal calcific atherosclerosis of the aortic arch. Right carotid system: No evidence of dissection, stenosis (50% or greater) or occlusion. Moderate calcified plaque of the carotid bifurcation with minimal less than  30% proximal ICA stenosis. Left carotid system: No evidence of dissection, stenosis (50% or greater) or occlusion. Mild calcified plaque of the bifurcation. Vertebral arteries: Codominant. No evidence of dissection, stenosis (50% or greater) or occlusion. Skeleton: Moderate cervical degenerative changes with upper cervical facet arthrosis greater on the right and discogenic degenerative disease greatest at the C4 through C7 levels. No high-grade bony canal stenosis. Other neck: Negative. Upper chest: 5 mm nodule in the right upper lobe (series 8, image 347). Review of the MIP images confirms the above findings CTA HEAD FINDINGS Anterior circulation: Left MCA superior division origin occlusion with intermediate collateralization. The point of occlusion is in the left anterior sylvian fissure corresponding to the hyperdensity on prior noncontrast CT of the head (series 10, image 147) and at the branching origin of the inferior division which makes an approximately 90 degrees angle downward turn relative to the M1 (series 13, image 31). Otherwise the anterior circulation is patent without high-grade stenosis, aneurysm, or occlusion. Calcified plaque of the carotid siphons with mild right-sided distal cavernous stenosis. Posterior circulation: No significant stenosis, proximal occlusion, aneurysm, or vascular malformation. Venous sinuses: As permitted by contrast timing, patent. Anatomic variants: Right larger than left posterior communicating artery is and diminutive patent anterior communicating artery. Review of the MIP images confirms the above findings CT Brain Perfusion Findings: CBF (<30%) Volume: 50mL Perfusion (Tmax>6.0s) volume: 168mL Mismatch Volume: 1109mL Infarction Location:Perfusion anomaly is centered in the left lateral frontal and parietal lobes. IMPRESSION:  CT perfusion: No infarct core (CBF less than 30%) by automated RAPID processing. At risk ischemia in the left superior MCA distribution with volume  108 cc. CTA head: 1. Left MCA superior division origin occlusion with intermediate collateralization. 2. Otherwise patent circle of Willis without significant stenosis or aneurysm identified. CTA neck: 1. Patent carotid and vertebral arteries. No dissection, aneurysm, or hemodynamically significant stenosis is identified. 2. 5 mm right upper lobe pulmonary nodule. No follow-up needed if patient is low-risk. Non-contrast chest CT can be considered in 12 months if patient is high-risk. This recommendation follows the consensus statement: Guidelines for Management of Incidental Pulmonary Nodules Detected on CT Images: From the Fleischner Society 2017; Radiology 2017; 284:228-243. These results were called by telephone at the time of interpretation on 09/05/2016 at 9:09 pm to Dr. Cheral Marker, who verbally acknowledged these results. Electronically Signed   By: Kristine Garbe M.D.   On: 09/05/2016 21:26   Portable Chest Xray  Result Date: 09/06/2016 CLINICAL DATA:  Respiratory failure, post intubation EXAM: PORTABLE CHEST 1 VIEW COMPARISON:  None. FINDINGS: The tip of an endotracheal tube is 4.4 cm above the carina. Heart is enlarged with mild vascular congestion. No aortic aneurysm. There is aortic atherosclerosis. No pneumothorax or pulmonary consolidation. No significant effusions. Left-sided ICD device with right ventricular lead is identified. A gastric tube extends below the left hemidiaphragm and appears to be coiled in the expected location of the stomach. Old right third and fourth rib fractures. IMPRESSION: 1. Endotracheal tube tip in satisfactory position 4.4 cm above the carina. Gastric tube in the stomach. 2. Cardiomegaly with mild vascular congestion noted. Electronically Signed   By: Ashley Royalty M.D.   On: 09/06/2016 00:50   Ir Percutaneous Art Thrombectomy/infusion Intracranial Inc Diag Angio  Result Date: 09/07/2016 INDICATION: Acute onset of global aphasia and right-sided weakness. Abnormal  CT angiogram of the head and neck. EXAM: 1. EMERGENT LARGE VESSEL OCCLUSION THROMBOLYSIS (anterior CIRCULATION) COMPARISON:  CT angiogram of the head and neck of 09/05/2016. MEDICATIONS: Ancef 1 g IV antibiotic was administered within 1 hour of the procedure. ANESTHESIA/SEDATION: General anesthesia. CONTRAST:  Isovue 300 approximately 75 mL. FLUOROSCOPY TIME:  Fluoroscopy Time: 17 minutes 12 seconds (1677 mGy). COMPLICATIONS: None immediate. TECHNIQUE: Following a full explanation of the procedure along with the potential associated complications, an informed witnessed consent was obtained from the patient's Godson . The risks of intracranial hemorrhage of 10%, worsening neurological deficit, ventilator dependency, death and inability to revascularize were all reviewed in detail with the patient's Godson. The patient was then put under general anesthesia by the Department of Anesthesiology at [facility]. The right groin was prepped and draped in the usual sterile fashion. Thereafter using modified Seldinger technique, transfemoral access into the right common femoral artery was obtained without difficulty. Over a 0.035 inch guidewire a 5 French Pinnacle sheath was inserted. Through this, and also over a 0.035 inch guidewire a 5 [place] JB 1 catheter was advanced to the aortic arch region and selectively positioned in the the left common carotid artery. FINDINGS: The left common carotid arteriogram demonstrates the left external carotid artery and its major branches to be widely patent. The left internal carotid artery at the bulb to the cranial skull base opacifies normally. The petrous, cavernous and supraclinoid segments opacify normally. The left middle cerebral artery M1 segment and the inferior divisions opacify into the capillary and venous phases. There is a large hyperperfusion noted in the superior division of the left middle cerebral  artery with delayed fill-in from collaterals from the left  pericallosal artery. A stump of an occluded prominent superior division of the left middle cerebral artery is noted. PROCEDURE: The diagnostic JB 1 catheter in the left common carotid artery was exchanged over a 0.035 inch 300 cm Rosen exchange guidewire for an 8 French 55 cm Brite tip neurovascular sheath using biplane roadmap technique and constant fluoroscopic guidance. Good aspiration obtained from the hub of the 8 French neurovascular catheter. This was then connected to continuous heparinized saline infusion. Over the [facility] exchange guidewire, a 85 cm 8 [place] FlowGate balloon guide catheter which had been prepped with 50% contrast and 50% heparinized saline infusion was advanced and positioned just proximal to the left common carotid bifurcation. The guidewire was removed. Good aspiration obtained from the hub of the 8 [place] FlowGate guide catheter. Over a 0.035 inch Roadrunner guidewire, using biplane roadmap technique and constant fluoroscopic guidance, the FlowGate guide catheter was advanced to the distal cervical segment of the left internal carotid artery. The guidewire was removed. Good aspiration obtained from the hub of the 8 [place] FlowGate guide catheter. A control arteriogram performed centered intracranially demonstrated no change in the intracranial circulation. At this time, in a coaxial manner and with constant heparinized saline infusion using biplane roadmap technique and constant fluoroscopic guidance, the combination of a 6 French 132 cm Catalyst guide catheter inside of which was a 0.021 Trevo ProVue microcatheter combination was advanced over a 0.014 inch Softip Synchro micro guidewire to the distal end of the [facility] guide catheter. With the micro guidewire leading with a J-tip configuration, the combination was navigated with a torque device to the supraclinoid left ICA. Thereafter, the wire was advanced through the occluded codominant superior division of the left middle cerebral  artery into the M2 M3 region followed by the microcatheter. The 6 [place] Catalyst guide catheter was positioned in the proximal M1 segment. A 4 mm x 40 mm Solitaire FR retrieval device was then advanced to the distal portion of the Trevo ProVue microcatheter. The O ring on the delivery microcatheter was loosened. With slight forward traction with the right hand on the delivery micro guidewire, with the left hand the delivery microcatheter was retrieved unsheathing the retrieval Solitaire FR 4 mm x 40 mm retrieval device completely. A control arteriogram performed through the 6 [place] Catalyst guide catheter in the left internal carotid artery demonstrated no significant opacification of the superior division of the left middle cerebral artery. The proximal portion of the retrieval device was captured into the distal microcatheter. Thereafter, after having inflated the occlusion balloon in the left internal carotid artery of the [facility] guide catheter, the combination of the retrieval device, the microcatheter, and also of the 6 French 132 cm Catalyst guide catheter was retrieved as constant aspiration was applied with a 60 mL syringe at the hub of the [facility] guide catheter, and also at the hub of the 6 [place] Catalyst guide catheter. This combination was gently retrieved and removed while aspiration was continued. The interstices of the retrieval device had chunks of clot as well as in the [place]. Aspiration was continued as the balloon was deflated in the left internal carotid artery. Free back bleed of blood was noted. A control arteriogram performed through the [facility] guide catheter in the left internal carotid artery demonstrated complete angiographic revascularization of the superior division of the left middle cerebral artery. The left anterior cerebral artery and the inferior division remained widely patent. There was  moderate spasm noted in the distal M1 segment which responded to 25 mics of  nitroglycerin x 3 via the guide catheter in the left internal carotid artery. A final control arteriogram performed through the [facility] guide catheter in the left internal carotid artery demonstrated complete angiographic revascularization of the left middle cerebral artery and the left anterior cerebral artery. A TICI 3 reperfusion was achieved. Throughout the procedure, the patient's blood pressure and neurological status remained stable. No angiographic evidence of mass-effect or extravasation was seen. The 8 [place] FlowGate guide catheter and the 8 [place] Brite tip neurovascular sheath were then retrieved into the abdominal aorta and exchanged over a J-tip guidewire for a 9 [place] Pinnacle sheath. This was then connected to continuous heparinized saline infusion. The right groin appeared soft without evidence of a hematoma. The distal pulses remained 2+ palpable in the dorsalis pedis bilaterally, and both posterior tibials were 1+ unchanged from prior to the procedure. IMPRESSION: Endovascular complete revascularization of the superior division of the left middle cerebral artery with 1 pass of the 4 mm x 40 mm Solitaire FR retrieval device, achieving a TICI 3 reperfusion. Time from groin puncture to TICI 3 reperfusion 49 minutes. PLAN: The patient then transferred to CT scanner for postprocedural CT scan of the brain. Electronically Signed   By: Luanne Bras M.D.   On: 09/06/2016 12:39   Ct Head Code Stroke W/o Cm  Result Date: 09/05/2016 CLINICAL DATA:  Code stroke.  Aphasia EXAM: CT HEAD WITHOUT CONTRAST TECHNIQUE: Contiguous axial images were obtained from the base of the skull through the vertex without intravenous contrast. COMPARISON:  None. FINDINGS: Brain: Generalized atrophy. Negative for hydrocephalus. Negative for acute infarct. No intracranial hemorrhage or mass. Vascular: Short segment hyperdensity left M2 segment compatible with acute thrombus. No other hyperdense vessel. Atherosclerotic  disease in the cavernous carotid bilaterally. Skull: Negative Sinuses/Orbits: Negative Other: None ASPECTS ([place] Stroke Program Early CT Score) - Ganglionic level infarction (caudate, lentiform nuclei, internal capsule, insula, M1-M3 cortex): 7 - Supraganglionic infarction (M4-M6 cortex): 3 Total score (0-10 with 10 being normal): 10 IMPRESSION: 1. Negative for acute infarct or hemorrhage. Hyperdense left M2 segment compatible with acute thrombosis. 2. ASPECTS is 10 These results were called by telephone at the time of interpretation on 09/05/2016 at 8:40 pm to Dr. Cheral Marker, who verbally acknowledged these results. Electronically Signed   By: Franchot Gallo M.D.   On: 09/05/2016 20:41    EKG: ECG from 7/26 was reviewed. Atrial fibrillation with variable ventricular response and a narrow QRS   Assessment and Plan:  Cardiomyopathy presumed nonischemic question rate related  Atrial fibrillation-long-term persistent  Congestive heart failure-acute on chronic-class III  Implantable defibrillator-[employer]  Stroke-recent  Hypertension/hypertensive heart disease   The patient has persistent nonischemic cardiomyopathy. Device interrogation demonstrates rapid atrial rates with averages over 100 bpm. He also has acute on chronic heart failure. Atrial fibrillation is likely contributing to his heart failure both directly and indirectly, i.e. with rapid rates and loss of atrial contractility as well as potentially contributing to his cardiomyopathy.  First we will try and slow down his ventricular response. We will add low-dose diltiazem, not withstanding the fact that he has congestive heart failure. This is both elevated and the European guidelines but also will based on the presumption that his atrial fibrillation rate is contributing to his heart failure following this down as potential significant impact.  I would also anticipate attempting cardioversion 1.  His left atrial size has quite  discordant measurements. I am not sanguine that cardioversion would result in maintaining sinus rhythm; however, get sinus rhythm and potentially the dofetilide or amiodarone might be beneficial. His bradycardia on his AV conduction histograms does not inform anything about his sinus rate.   With his congestive heart failure, we'll increase his furosemide from 20-40 daily. I would like to have him come back and see ML-PA in a couple of weeks and if he is euvolemic would like to schedule a cardioversion.  His device has been reprogrammed to minimize long shorts, to improve discrimination and avoid inappropriate therapy for atrial fibrillation.  Virl Axe

## 2016-09-20 LAB — CUP PACEART INCLINIC DEVICE CHECK
HIGH POWER IMPEDANCE MEASURED VALUE: 45 Ohm
Implantable Pulse Generator Implant Date: 20151113
Lead Channel Sensing Intrinsic Amplitude: 22.9 mV
Lead Channel Setting Pacing Amplitude: 2.5 V
Lead Channel Setting Pacing Pulse Width: 0.5 ms
Lead Channel Setting Sensing Sensitivity: 0.6 mV
MDC IDC LEAD IMPLANT DT: 20151113
MDC IDC LEAD LOCATION: 753860
MDC IDC LEAD SERIAL: 135220
MDC IDC MSMT LEADCHNL RV PACING THRESHOLD AMPLITUDE: 0.8 V
MDC IDC MSMT LEADCHNL RV PACING THRESHOLD PULSEWIDTH: 0.5 ms
MDC IDC PG SERIAL: 104417
MDC IDC SESS DTM: 20180801040000

## 2016-10-02 ENCOUNTER — Other Ambulatory Visit: Payer: Self-pay | Admitting: General Surgery

## 2016-10-03 ENCOUNTER — Encounter (HOSPITAL_COMMUNITY): Payer: Self-pay | Admitting: Anesthesiology

## 2016-10-03 ENCOUNTER — Ambulatory Visit (HOSPITAL_COMMUNITY)
Admission: RE | Admit: 2016-10-03 | Discharge: 2016-10-03 | Disposition: A | Discharge status: Home / Self Care | Payer: Medicaid Other | Source: Ambulatory Visit | Attending: Interventional Radiology | Admitting: Interventional Radiology

## 2016-10-03 DIAGNOSIS — I639 Cerebral infarction, unspecified: Secondary | ICD-10-CM

## 2016-10-03 HISTORY — PX: IR RADIOLOGIST EVAL & MGMT: IMG5224

## 2016-10-05 ENCOUNTER — Encounter (HOSPITAL_COMMUNITY): Payer: Self-pay | Admitting: Interventional Radiology

## 2016-10-08 ENCOUNTER — Other Ambulatory Visit: Payer: Self-pay | Admitting: *Deleted

## 2016-10-08 ENCOUNTER — Ambulatory Visit (INDEPENDENT_AMBULATORY_CARE_PROVIDER_SITE_OTHER): Payer: Medicaid Other | Admitting: Physician Assistant

## 2016-10-08 ENCOUNTER — Encounter: Payer: Self-pay | Admitting: Physician Assistant

## 2016-10-08 VITALS — BP 126/62 | HR 71 | Ht 69.0 in | Wt 186.0 lb

## 2016-10-08 DIAGNOSIS — I63412 Cerebral infarction due to embolism of left middle cerebral artery: Secondary | ICD-10-CM

## 2016-10-08 DIAGNOSIS — I5022 Chronic systolic (congestive) heart failure: Secondary | ICD-10-CM

## 2016-10-08 DIAGNOSIS — I10 Essential (primary) hypertension: Secondary | ICD-10-CM | POA: Diagnosis not present

## 2016-10-08 DIAGNOSIS — Z720 Tobacco use: Secondary | ICD-10-CM | POA: Diagnosis not present

## 2016-10-08 DIAGNOSIS — Z95810 Presence of automatic (implantable) cardiac defibrillator: Secondary | ICD-10-CM | POA: Diagnosis not present

## 2016-10-08 DIAGNOSIS — I255 Ischemic cardiomyopathy: Secondary | ICD-10-CM

## 2016-10-08 DIAGNOSIS — I481 Persistent atrial fibrillation: Secondary | ICD-10-CM | POA: Diagnosis not present

## 2016-10-08 DIAGNOSIS — I4819 Other persistent atrial fibrillation: Secondary | ICD-10-CM

## 2016-10-08 MED ORDER — HYDRALAZINE HCL 25 MG PO TABS: 25.0000 mg | ORAL_TABLET | Freq: Two times a day (BID) | ORAL | 11 refills | Status: DC

## 2016-10-08 MED ORDER — ISOSORBIDE MONONITRATE 10 MG PO TABS: 10.0000 mg | ORAL_TABLET | Freq: Every day | ORAL | 11 refills | Status: DC

## 2016-10-08 MED ORDER — APIXABAN 5 MG PO TABS: 5.0000 mg | ORAL_TABLET | Freq: Two times a day (BID) | ORAL | 6 refills | Status: DC

## 2016-10-08 MED ORDER — FUROSEMIDE 40 MG PO TABS: 40.0000 mg | ORAL_TABLET | Freq: Every day | ORAL | 6 refills | Status: DC

## 2016-10-08 MED ORDER — DILTIAZEM HCL ER COATED BEADS 120 MG PO CP24: 120.0000 mg | ORAL_CAPSULE | Freq: Every day | ORAL | 6 refills | Status: DC

## 2016-10-08 NOTE — Patient Instructions (Signed)
Medication Instructions:  Your physician recommends that you continue on your current medications as directed. Please refer to the Current Medication list given to you today.   Labwork: Your physician recommends that you return for lab work today for CBC, BMET  Testing/Procedures:  Your physician has recommended that you have a Cardioversion (DCCV). Electrical Cardioversion uses a jolt of electricity to your heart either through paddles or wired patches attached to your chest. This is a controlled, usually prescheduled, procedure. Defibrillation is done under light anesthesia in the hospital, and you usually go home the day of the procedure. This is done to get your heart back into a normal rhythm. You are not awake for the procedure. Please see the instruction sheet given to you today.  Please have nothing to eat or drink after midnight the night before.  Take all of your medications prior to the procedure.  Your lab work will be done today 10/08/16 Please make sure that you have someone to drive you to and from the hospital and someone to stay with you the first 24 hours after your procedure.      Any Other Special Instructions Will Be Listed Below (If Applicable).     If you need a refill on your cardiac medications before your next appointment, please call your pharmacy.

## 2016-10-08 NOTE — Progress Notes (Addendum)
Cardiology Office Note    Date:  10/08/2016   ID:  Glenn [name], DOB 27-Oct-1950, MRN [id number]  PCP:  Rogue Bussing, MD  Cardiologist: Dr. [name]  Chief Complaint  Patient presents with  . Follow-up    History of Present Illness:  Glenn [name] is a 66 y.o. male with history of atrial fibrillation on Xarelto with ICD, CHF, hepatitis C and hypertension who presented to [facility] with left MCA CVA. He did not receive TPA due to arriving outside the treatment window and being on Xarelto. He underwent chemical thrombectomy for left M2 occlusion. Patient was switched to Eliquis for secondary stroke prevention and aspirin 81 mg was added given his cardiac history. 2-D echo in the hospital LVEF 30-35%.   Patient moved here from [place] in May. He has a 2 year history of Atrial fibrillation and had an ICD-[employer]a 12/2015 put in. Says he had a heart cath that showed no blockages but he had CHF. Smokes 1 ppd 30-40 yrs. Says he had chest tightness and shortness of breath for about a week prior to CVA. Also missed a couple of Xarelto doses.Has had some chest tightness and shortness of breath since he's been home. May have gotten extra salt in his diet. Hasn't seen cardiologist in 6 months. Defibrillator went off 8 months ago and he was hospitalized in [place].     I saw the patient 09/13/16 at which time he had acute on chronic systolic CHF. I increased his Lasix to 40 mg for 3 days then back to 20 mg daily. 2 g sodium diet was given. Patient saw Dr. [name] as a new patient 09/19/16. Device interrogation demonstrated rapid atrial rates with averages over 100 bpm. He also had acute on chronic CHF. Dr. [name] felt the atrial fibrillation was likely contributing to his heart failure. Low-dose diltiazem was added despite the fact that he had CHF, based on the presumption that his atrial fibrillation rate was contributing to his heart failure. He also wanted to attempt cardioversion once. His  LA size has discordant measurements. He felt if he could maintain sinus rhythm and potentially dofetilide or amiodarone might be beneficial. Lasix was increased to 40 mg daily.  Patient was placed on my schedule today to see if he was euvolemic and to schedule a cardioversion. His device was reprogrammed.  Patient comes in today feeling much better. He says he can walk to the store and back without stopping to get his breath. His swelling is much better. He did complain of itching in his groin area and on the palms of his hands. He thought he had a rash in his groin but he doesn't. All this itching just stopped yesterday.   Past Medical History:  Diagnosis Date  . CHF (congestive heart failure) ([place])   . Hepatitis C   . Hypertension     Past Surgical History:  Procedure Laterality Date  . CARDIAC DEFIBRILLATOR PLACEMENT  2015  . EYE SURGERY Left 1990  . IR PERCUTANEOUS ART THROMBECTOMY/INFUSION INTRACRANIAL INC DIAG ANGIO  09/05/2016  . IR RADIOLOGIST EVAL & MGMT  10/03/2016  . RADIOLOGY WITH ANESTHESIA N/A 09/05/2016   Procedure: RADIOLOGY WITH ANESTHESIA;  Surgeon: Luanne Bras, MD;  Location: [facility];  Service: Radiology;  Laterality: N/A;    Current Medications: No outpatient prescriptions have been marked as taking for the 10/08/16 encounter (Office Visit) with Imogene Burn, PA-C.     Allergies:   Patient has no known allergies.  Social History   Social History  . Marital status: Single    Spouse name: N/A  . Number of children: N/A  . Years of education: 34 (some college)   Occupational History  . disability    Social History Main Topics  . Smoking status: Former Smoker    Packs/day: 0.50    Types: Cigarettes  . Smokeless tobacco: Never Used     Comment: pt stated he quit smoking 1 week ago  . Alcohol use 1.8 oz/week    3 Cans of beer per week  . Drug use: Yes    Frequency: 2.0 times per week    Types: Marijuana  . Sexual activity: Yes    Partners:  Female    Birth control/ protection: Condom   Other Topics Concern  . None   Social History Narrative  . None     Family History:  The patient's  Has no family history of heart disease.  ROS:   Please see the history of present illness.    Review of Systems  Constitution: Negative.  HENT: Negative.   Cardiovascular: Positive for irregular heartbeat and leg swelling.  Respiratory: Negative.   Endocrine: Negative.   Hematologic/Lymphatic: Negative.   Skin: Positive for itching.  Musculoskeletal: Negative.   Gastrointestinal: Negative.   Genitourinary: Negative.   Neurological: Negative.    All other systems reviewed and are negative.   PHYSICAL EXAM:   VS:  BP 126/62   Pulse 71   Ht 5\' 9"  (1.753 m)   Wt 186 lb (84.4 kg)   SpO2 96%   BMI 27.47 kg/m   Physical Exam  GEN: Well nourished, well developed, in no acute distress  Neck: no JVD, carotid bruits, or masses Cardiac: Irregular irregular with 2/6 systolic murmur at the left sternal border Respiratory:  clear to auscultation bilaterally, normal work of breathing GI: soft, nontender, nondistended, + BS Ext: Trace of edema without cyanosis, clubbing  Good distal pulses bilaterally MS: no deformity or atrophy  Skin: warm and dry, no rash anywhere including   groin area or hands Neuro:  Alert and Oriented x 3, Strength and sensation are intact Psych: euthymic mood, full affect  Wt Readings from Last 3 Encounters:  10/08/16 186 lb (84.4 kg)  09/19/16 191 lb (86.6 kg)  09/13/16 194 lb 6.4 oz (88.2 kg)      Studies/Labs Reviewed:   EKG:  EKG is  ordered today.  The ekg ordered today demonstrates Atrial fibrillation at 73 bpm nonspecific ST-T wave changes, no acute change  Recent Labs: 09/05/2016: ALT 33 09/07/2016: BUN 7; Creatinine, Ser 0.99; Hemoglobin 10.4; Magnesium 1.7; Platelets 144; Potassium 3.5; Sodium 138   Lipid Panel    Component Value Date/Time   CHOL 96 09/06/2016 0500   TRIG 128 09/06/2016 0500    HDL 26 (L) 09/06/2016 0500   CHOLHDL 3.7 09/06/2016 0500   VLDL 26 09/06/2016 0500   LDLCALC 44 09/06/2016 0500    Additional studies/ records that were reviewed today include:   TTE - Left ventricle: The cavity size was normal. Systolic function was   moderately to severely reduced. The estimated ejection fraction   was in the range of 30% to 35%. Diffuse hypokinesis. Moderate   hypokinesis of the anteroseptal, inferior, and inferoseptal   myocardium. - Aortic valve: Transvalvular velocity was within the normal range.   There was no stenosis. There was moderate regurgitation. Valve   area (VTI): 2.25 cm^2. Valve area (Vmax): 2.37 cm^2. Valve area   (  Vmean): 2.34 cm^2. - Mitral valve: Transvalvular velocity was within the normal range.   There was no evidence for stenosis. There was mild regurgitation. - Left atrium: The atrium was moderately dilated. - Right ventricle: The cavity size was normal. Wall thickness was   normal. Systolic function was mildly reduced. - Right atrium: The atrium was moderately dilated. - Tricuspid valve: There was mild-moderate regurgitation. - Pulmonary arteries: Systolic pressure was mildly increased. PA   peak pressure: 38 mm Hg (S).          ASSESSMENT:    1. Persistent atrial fibrillation ([place])   2. Chronic systolic congestive heart failure ([place])   3. Essential hypertension   4. Ischemic cardiomyopathy   5. ICD (implantable cardioverter-defibrillator) in place   6. Cerebrovascular accident (CVA) due to embolism of left middle cerebral artery ([place])   7. Tobacco abuse      PLAN:  In order of problems listed above:  Persistent atrial fibrillation now rate better controlled on diltiazem. Patient did have itching in his groin area and hands that he thought was coming from the diltiazem but this stopped yesterday. Dr. [name] wants to set him up for cardioversion which will be done this Wednesday. We will arrange for [facility] ICD  rep to be present. Continue Eliquis and ASA per neurology. Patient now states they took him off ASA. Patient states he has not missed any doses of his Eliquis in the past month. Discussed with Dr. [name] who concurs the patient can proceed with DCCV since he's been on Eliquis for over a month since his CVA.  Chronic systolic CHF ejection fraction 30-35% much better compensated on increased Lasix and with better rate control.  Essential hypertension controlled  ICD implant 2 years ago [employer], now being followed by Dr. [name]  Left MCA CVA felt secondary to atrial fibrillation status post mechanical thrombectomy with complete recanalization an excellent recovery. Patient's a missed a couple Xarelto doses prior to his stroke.  Tobacco abuse smoking cessation recommended.  Ischemic cardiomyopathy ejection fraction 30-35%    Medication Adjustments/Labs and Tests Ordered: Current medicines are reviewed at length with the patient today.  Concerns regarding medicines are outlined above.  Medication changes, Labs and Tests ordered today are listed in the Patient Instructions below. Patient Instructions  Medication Instructions:  Your physician recommends that you continue on your current medications as directed. Please refer to the Current Medication list given to you today.   Labwork: Your physician recommends that you return for lab work today for CBC, BMET  Testing/Procedures:  Your physician has recommended that you have a Cardioversion (DCCV). Electrical Cardioversion uses a jolt of electricity to your heart either through paddles or wired patches attached to your chest. This is a controlled, usually prescheduled, procedure. Defibrillation is done under light anesthesia in the hospital, and you usually go home the day of the procedure. This is done to get your heart back into a normal rhythm. You are not awake for the procedure. Please see the instruction sheet given to you  today.  Please have nothing to eat or drink after midnight the night before.  Take all of your medications prior to the procedure.  Your lab work will be done today 10/08/16 Please make sure that you have someone to drive you to and from the hospital and someone to stay with you the first 24 hours after your procedure.      Any Other Special Instructions Will Be Listed Below (If Applicable).  If you need a refill on your cardiac medications before your next appointment, please call your pharmacy.      Signed, [name], PA-C  10/08/2016 3:02 PM    [facility] [address], [place], [place]  [id number] Phone: [phone]; Fax: [phone]

## 2016-10-09 ENCOUNTER — Telehealth: Payer: Self-pay | Admitting: Physician Assistant

## 2016-10-09 LAB — BASIC METABOLIC PANEL
BUN/Creatinine Ratio: 8 — ABNORMAL LOW (ref 10–24)
BUN: 10 mg/dL (ref 8–27)
CALCIUM: 9.1 mg/dL (ref 8.6–10.2)
CO2: 25 mmol/L (ref 20–29)
CREATININE: 1.22 mg/dL (ref 0.76–1.27)
Chloride: 104 mmol/L (ref 96–106)
GFR calc Af Amer: 72 mL/min/{1.73_m2} (ref >59)
GFR, EST NON AFRICAN AMERICAN: 62 mL/min/{1.73_m2} (ref >59)
GLUCOSE: 71 mg/dL (ref 65–99)
Potassium: 3.7 mmol/L (ref 3.5–5.2)
SODIUM: 142 mmol/L (ref 134–144)

## 2016-10-09 LAB — CBC
HEMATOCRIT: 34.2 % — AB (ref 37.5–51.0)
HEMOGLOBIN: 11.7 g/dL — AB (ref 13.0–17.7)
MCH: 32.1 pg (ref 26.6–33.0)
MCHC: 34.2 g/dL (ref 31.5–35.7)
MCV: 94 fL (ref 79–97)
Platelets: 171 10*3/uL (ref 150–379)
RBC: 3.64 x10E6/uL — AB (ref 4.14–5.80)
RDW: 12.5 % (ref 12.3–15.4)
WBC: 3.5 10*3/uL (ref 3.4–10.8)

## 2016-10-09 MED ORDER — POTASSIUM CHLORIDE CRYS ER 20 MEQ PO TBCR: 20.0000 meq | EXTENDED_RELEASE_TABLET | Freq: Every day | ORAL | 3 refills | Status: DC

## 2016-10-09 NOTE — Telephone Encounter (Signed)
-----   Message from Imogene Burn, PA-C sent at 10/09/2016  7:55 AM EDT ----- Labs stable for cardioversion. Potassium a little low. Add kdur 20 meq daily.

## 2016-10-09 NOTE — Telephone Encounter (Signed)
Follow up    Per pt leave message on voicemail with his results or text him , his phone is not working properly.   He is returning [place] call for lab results

## 2016-10-10 ENCOUNTER — Encounter (HOSPITAL_COMMUNITY): Payer: Self-pay | Admitting: *Deleted

## 2016-10-10 ENCOUNTER — Ambulatory Visit (HOSPITAL_COMMUNITY): Payer: Medicaid Other | Admitting: Anesthesiology

## 2016-10-10 ENCOUNTER — Ambulatory Visit (HOSPITAL_COMMUNITY)
Admission: RE | Admit: 2016-10-10 | Discharge: 2016-10-10 | Disposition: A | Discharge status: Home / Self Care | Payer: Medicaid Other | Source: Ambulatory Visit | Attending: Cardiology | Admitting: Cardiology

## 2016-10-10 ENCOUNTER — Encounter (HOSPITAL_COMMUNITY)
Admission: RE | Disposition: A | Discharge status: Home / Self Care | Payer: Self-pay | Source: Ambulatory Visit | Attending: Cardiology

## 2016-10-10 DIAGNOSIS — Z9889 Other specified postprocedural states: Secondary | ICD-10-CM | POA: Insufficient documentation

## 2016-10-10 DIAGNOSIS — Z8673 Personal history of transient ischemic attack (TIA), and cerebral infarction without residual deficits: Secondary | ICD-10-CM | POA: Diagnosis not present

## 2016-10-10 DIAGNOSIS — Z87891 Personal history of nicotine dependence: Secondary | ICD-10-CM | POA: Diagnosis not present

## 2016-10-10 DIAGNOSIS — Z79899 Other long term (current) drug therapy: Secondary | ICD-10-CM | POA: Diagnosis not present

## 2016-10-10 DIAGNOSIS — I4891 Unspecified atrial fibrillation: Secondary | ICD-10-CM | POA: Diagnosis not present

## 2016-10-10 DIAGNOSIS — I429 Cardiomyopathy, unspecified: Secondary | ICD-10-CM | POA: Insufficient documentation

## 2016-10-10 DIAGNOSIS — I700 Atherosclerosis of aorta: Secondary | ICD-10-CM | POA: Insufficient documentation

## 2016-10-10 DIAGNOSIS — Z791 Long term (current) use of non-steroidal anti-inflammatories (NSAID): Secondary | ICD-10-CM | POA: Diagnosis not present

## 2016-10-10 DIAGNOSIS — Z7982 Long term (current) use of aspirin: Secondary | ICD-10-CM | POA: Diagnosis not present

## 2016-10-10 DIAGNOSIS — Z95810 Presence of automatic (implantable) cardiac defibrillator: Secondary | ICD-10-CM | POA: Insufficient documentation

## 2016-10-10 DIAGNOSIS — Z7901 Long term (current) use of anticoagulants: Secondary | ICD-10-CM | POA: Insufficient documentation

## 2016-10-10 DIAGNOSIS — Z7951 Long term (current) use of inhaled steroids: Secondary | ICD-10-CM | POA: Diagnosis not present

## 2016-10-10 DIAGNOSIS — I110 Hypertensive heart disease with heart failure: Secondary | ICD-10-CM | POA: Insufficient documentation

## 2016-10-10 DIAGNOSIS — I4819 Other persistent atrial fibrillation: Secondary | ICD-10-CM

## 2016-10-10 DIAGNOSIS — I481 Persistent atrial fibrillation: Secondary | ICD-10-CM | POA: Diagnosis not present

## 2016-10-10 DIAGNOSIS — B1920 Unspecified viral hepatitis C without hepatic coma: Secondary | ICD-10-CM | POA: Insufficient documentation

## 2016-10-10 DIAGNOSIS — I509 Heart failure, unspecified: Secondary | ICD-10-CM | POA: Diagnosis not present

## 2016-10-10 HISTORY — PX: CARDIOVERSION: SHX1299

## 2016-10-10 SURGERY — CARDIOVERSION
Anesthesia: Monitor Anesthesia Care

## 2016-10-10 MED ORDER — LIDOCAINE 2% (20 MG/ML) 5 ML SYRINGE
INTRAMUSCULAR | Status: DC | PRN
  Administered 2016-10-10: 60 mg via INTRAVENOUS

## 2016-10-10 MED ORDER — HYDROCORTISONE 1 % EX CREA: 1.0000 "application " | TOPICAL_CREAM | Freq: Three times a day (TID) | CUTANEOUS | Status: DC | PRN

## 2016-10-10 MED ORDER — SODIUM CHLORIDE 0.9 % IV SOLN
250.0000 mL | INTRAVENOUS | Status: DC
  Administered 2016-10-10 (×2): via INTRAVENOUS

## 2016-10-10 MED ORDER — SODIUM CHLORIDE 0.9% FLUSH: 3.0000 mL | Freq: Two times a day (BID) | INTRAVENOUS | Status: DC

## 2016-10-10 MED ORDER — SODIUM CHLORIDE 0.9% FLUSH: 3.0000 mL | INTRAVENOUS | Status: DC | PRN

## 2016-10-10 MED ORDER — PROPOFOL 10 MG/ML IV BOLUS
INTRAVENOUS | Status: DC | PRN
  Administered 2016-10-10: 80 mg via INTRAVENOUS

## 2016-10-10 NOTE — CV Procedure (Signed)
    Cardioversion Note  Glenn [name] [id number] 1950/04/07  Procedure: DC Cardioversion Indications: atrial fibrillation  Procedure Details Consent: Obtained Time Out: Verified patient identification, verified procedure, site/side was marked, verified correct patient position, special equipment/implants available, Radiology Safety Procedures followed,  medications/allergies/relevent history reviewed, required imaging and test results available.  Performed  The patient has been on adequate anticoagulation.  The patient received IV propofol and lidocaine administered by anesthesia staff  for sedation.  Synchronous cardioversion was performed at 120 joules.  The cardioversion was successful.   Complications: No apparent complications Patient did tolerate procedure well.   Glenn Dawley, MD, [facility] 10/10/2016, 1:32 PM

## 2016-10-10 NOTE — Interval H&P Note (Signed)
History and Physical Interval Note:  10/10/2016 1:02 PM  Sharen Hint  has presented today for surgery, with the diagnosis of AFIB   The various methods of treatment have been discussed with the patient and family. After consideration of risks, benefits and other options for treatment, the patient has consented to  Procedure(s): CARDIOVERSION (N/A) as a surgical intervention .  The patient's history has been reviewed, patient examined, no change in status, stable for surgery.  I have reviewed the patient's chart and labs.  Questions were answered to the patient's satisfaction.     [name]

## 2016-10-10 NOTE — Anesthesia Procedure Notes (Signed)
Date/Time: 10/10/2016 1:26 PM Performed by: Willeen Cass P Pre-anesthesia Checklist: Patient identified, Emergency Drugs available, Patient being monitored, Suction available and Timeout performed Patient Re-evaluated:Patient Re-evaluated prior to induction Oxygen Delivery Method: Ambu bag Preoxygenation: Pre-oxygenation with 100% oxygen Induction Type: IV induction Ventilation: Mask ventilation without difficulty Dental Injury: Teeth and Oropharynx as per pre-operative assessment

## 2016-10-10 NOTE — Anesthesia Postprocedure Evaluation (Signed)
Anesthesia Post Note  Patient: Glenn [name]  Procedure(s) Performed: Procedure(s) (LRB): CARDIOVERSION (N/A)     Patient location during evaluation: PACU Anesthesia Type: MAC Level of consciousness: awake and alert Pain management: pain level controlled Vital Signs Assessment: post-procedure vital signs reviewed and stable Respiratory status: spontaneous breathing, nonlabored ventilation, respiratory function stable and patient connected to nasal cannula oxygen Cardiovascular status: stable and blood pressure returned to baseline Anesthetic complications: no    Last Vitals:  Vitals:   10/10/16 1334 10/10/16 1340  BP:  129/70  Pulse: 77 80  Resp: 18 (!) 21  Temp:  36.6 C  SpO2: 97% 97%    Last Pain:  Vitals:   10/10/16 1340  TempSrc: Oral                 Altus Zaino

## 2016-10-10 NOTE — Transfer of Care (Signed)
Immediate Anesthesia Transfer of Care Note  Patient: Glenn [name]  Procedure(s) Performed: Procedure(s): CARDIOVERSION (N/A)  Patient Location: PACU and Endoscopy Unit  Anesthesia Type:General  Level of Consciousness: awake, drowsy and patient cooperative  Airway & Oxygen Therapy: Patient Spontanous Breathing and Patient connected to nasal cannula oxygen  Post-op Assessment: Report given to RN, Post -op Vital signs reviewed and stable and Patient moving all extremities  Post vital signs: Reviewed and stable  Last Vitals:  Vitals:   10/10/16 1311  BP: 132/60  Resp: (!) 24  Temp: 37 C  SpO2: 97%    Last Pain:  Vitals:   10/10/16 1311  TempSrc: Oral         Complications: No apparent anesthesia complications

## 2016-10-10 NOTE — Anesthesia Preprocedure Evaluation (Addendum)
Anesthesia Evaluation  Patient identified by MRN, date of birth, ID band Patient awake  Preop documentation limited or incomplete due to emergent nature of procedure.  Airway Mallampati: II  TM Distance: >3 FB     Dental  (+) Teeth Intact   Pulmonary Current Smoker, former smoker,    breath sounds clear to auscultation       Cardiovascular hypertension, +CHF  + Cardiac Defibrillator  Rhythm:Regular Rate:Normal  Unknown EF with CHF and AICD   Neuro/Psych    GI/Hepatic (+) Hepatitis -, C  Endo/Other    Renal/GU      Musculoskeletal   Abdominal   Peds  Hematology   Anesthesia Other Findings Echo 7/18 - Left ventricle: The cavity size was normal. Systolic function was   moderately to severely reduced. The estimated ejection fraction   was in the range of 30% to 35%. Diffuse hypokinesis. Moderate   hypokinesis of the anteroseptal, inferior, and inferoseptal   myocardium.  Reproductive/Obstetrics                             Anesthesia Physical  Anesthesia Plan  ASA: IV  Anesthesia Plan: General   Post-op Pain Management:    Induction:   PONV Risk Score and Plan: 1 and Ondansetron, Dexamethasone and Treatment may vary due to age or medical condition  Airway Management Planned: Nasal Cannula and Mask  Additional Equipment:   Intra-op Plan:   Post-operative Plan:   Informed Consent: I have reviewed the patients History and Physical, chart, labs and discussed the procedure including the risks, benefits and alternatives for the proposed anesthesia with the patient or authorized representative who has indicated his/her understanding and acceptance.     Plan Discussed with: CRNA, Anesthesiologist and Surgeon  Anesthesia Plan Comments:        Anesthesia Quick Evaluation

## 2016-10-10 NOTE — Anesthesia Preprocedure Evaluation (Deleted)
Anesthesia Evaluation  Patient identified by MRN, date of birth, ID band Patient awake  Preop documentation limited or incomplete due to emergent nature of procedure.  Airway Mallampati: II  TM Distance: >3 FB     Dental  (+) Teeth Intact   Pulmonary Current Smoker, former smoker,    breath sounds clear to auscultation       Cardiovascular hypertension, +CHF  + Cardiac Defibrillator  Rhythm:Regular Rate:Normal  Unknown EF with CHF and AICD   Neuro/Psych    GI/Hepatic (+) Hepatitis -, C  Endo/Other    Renal/GU      Musculoskeletal   Abdominal   Peds  Hematology   Anesthesia Other Findings 7/18 Echo - Left ventricle: The cavity size was normal. Systolic function was   moderately to severely reduced. The estimated ejection fraction   was in the range of 30% to 35%. Diffuse hypokinesis. Moderate   hypokinesis of the anteroseptal, inferior, and inferoseptal   myocardium.  Reproductive/Obstetrics                             Anesthesia Physical  Anesthesia Plan  ASA: IV  Anesthesia Plan: MAC   Post-op Pain Management:    Induction: Intravenous, Rapid sequence and Cricoid pressure planned  PONV Risk Score and Plan: 1 and Treatment may vary due to age or medical condition and Propofol  Airway Management Planned: Nasal Cannula and Natural Airway  Additional Equipment:   Intra-op Plan:   Post-operative Plan:   Informed Consent: I have reviewed the patients History and Physical, chart, labs and discussed the procedure including the risks, benefits and alternatives for the proposed anesthesia with the patient or authorized representative who has indicated his/her understanding and acceptance.     Plan Discussed with: CRNA, Anesthesiologist and Surgeon  Anesthesia Plan Comments:         Anesthesia Quick Evaluation                                   Anesthesia Evaluation  Patient  identified by MRN, date of birth, ID band Patient awake  Preop documentation limited or incomplete due to emergent nature of procedure.  Airway Mallampati: II  TM Distance: >3 FB     Dental  (+) Teeth Intact   Pulmonary Current Smoker,    breath sounds clear to auscultation       Cardiovascular hypertension, +CHF  + Cardiac Defibrillator  Rhythm:Regular Rate:Normal  Unknown EF with CHF and AICD   Neuro/Psych    GI/Hepatic (+) Hepatitis -, C  Endo/Other    Renal/GU      Musculoskeletal   Abdominal   Peds  Hematology   Anesthesia Other Findings   Reproductive/Obstetrics                           Anesthesia Physical Anesthesia Plan  ASA: IV  Anesthesia Plan: General   Post-op Pain Management:    Induction: Intravenous, Rapid sequence and Cricoid pressure planned  PONV Risk Score and Plan: 1 and Treatment may vary due to age or medical condition and Propofol  Airway Management Planned: Oral ETT  Additional Equipment:   Intra-op Plan:   Post-operative Plan: Post-operative intubation/ventilation  Informed Consent: I have reviewed the patients History and Physical, chart, labs and discussed the procedure including the risks, benefits and alternatives for the  proposed anesthesia with the patient or authorized representative who has indicated his/her understanding and acceptance.     Plan Discussed with: CRNA, Anesthesiologist and Surgeon  Anesthesia Plan Comments:      Anesthesia Quick Evaluation

## 2016-10-10 NOTE — Discharge Instructions (Signed)
Electrical Cardioversion, Care After °This sheet gives you information about how to care for yourself after your procedure. Your health care provider may also give you more specific instructions. If you have problems or questions, contact your health care provider. °What can I expect after the procedure? °After the procedure, it is common to have: °· Some redness on the skin where the shocks were given. ° °Follow these instructions at home: °· Do not drive for 24 hours if you were given a medicine to help you relax (sedative). °· Take over-the-counter and prescription medicines only as told by your health care provider. °· Ask your health care provider how to check your pulse. Check it often. °· Rest for 48 hours after the procedure or as told by your health care provider. °· Avoid or limit your caffeine use as told by your health care provider. °Contact a health care provider if: °· You feel like your heart is beating too quickly or your pulse is not regular. °· You have a serious muscle cramp that does not go away. °Get help right away if: °· You have discomfort in your chest. °· You are dizzy or you feel faint. °· You have trouble breathing or you are short of breath. °· Your speech is slurred. °· You have trouble moving an arm or leg on one side of your body. °· Your fingers or toes turn cold or blue. °This information is not intended to replace advice given to you by your health care provider. Make sure you discuss any questions you have with your health care provider. °Document Released: 11/26/2012 Document Revised: 09/09/2015 Document Reviewed: 08/12/2015 °[employer] Interactive Patient Education © 2018 [employer] ° °

## 2016-10-10 NOTE — H&P (View-Only) (Signed)
ELECTROPHYSIOLOGY CONSULT NOTE  Patient ID: Glenn [name], MRN: [id number], DOB/AGE: Nov 24, 1950 66 y.o. Admit date: (Not on file) Date of Consult: 09/19/2016  Primary Physician: Rogue Bussing, MD Primary Cardiologist: new     Glenn [name] is a 66 y.o. male who is being seen To establish ICD care.    HPI  Glenn [name] is a 66 y.o. male  Seen to establish ICD care.  His history is not clear. He describes presenting to the hospital a couple of years ago with irregular heartbeat. He was put on anticoagulation so presumably was in atrial fibrillation. A couple of months later he was given a defibrillator. He does not recall ever having been cardioverted. He does not recall a catheterization. He has been anticoagulated with apixoban and aspirin.  His atrial fibrillation presumably has been permanent. He was shocked 5/17 multiple times for atrial fibrillation with a rapid rate.  [facility] was recently [facility] following a stroke to his left MCA. It was treated with a thrombectomy. There has been gradual recovery.  He has chronic shortness of breath. This is true at less than 100 yards. He has orthopnea nocturnal dyspnea. He has had peripheral edema. This improved with up titration of his diuretics. Anticipated down titration was associated with worsening symptoms and again was on has increased his Lasix again. He is aware of the need for salt restriction and fluid restriction.    Echocardiogram 7/18 demonstrated EF 30-35% moderate AI and moderate biatrial enlargement (LAE 44/3.2/47)   Past Medical History:  Diagnosis Date  . CHF (congestive heart failure) ([place])   . Hepatitis C   . Hypertension       Surgical History:  Past Surgical History:  Procedure Laterality Date  . CARDIAC DEFIBRILLATOR PLACEMENT  2015  . EYE SURGERY Left 1990  . IR PERCUTANEOUS ART THROMBECTOMY/INFUSION INTRACRANIAL INC DIAG ANGIO  09/05/2016  . RADIOLOGY WITH ANESTHESIA N/A  09/05/2016   Procedure: RADIOLOGY WITH ANESTHESIA;  Surgeon: Luanne Bras, MD;  Location: [place];  Service: Radiology;  Laterality: N/A;     Home Meds: Prior to Admission medications   Medication Sig Start Date End Date Taking? Authorizing Provider  apixaban (ELIQUIS) 5 MG TABS tablet Take 1 tablet (5 mg total) by mouth 2 (two) times daily. 09/08/16  Yes Rinehuls, Early Chars, PA-C  aspirin EC 81 MG tablet Take 81 mg by mouth daily.   Yes [provider]  busPIRone (BUSPAR) 7.5 MG tablet Take 7.5 mg by mouth daily as needed for depression. 07/09/16  Yes [provider]  carvedilol (COREG) 25 MG tablet Take 1 tablet (25 mg total) by mouth 2 (two) times daily with a meal. 09/08/16  Yes Rinehuls, Early Chars, PA-C  Cholecalciferol (VITAMIN D3) 1000 units CAPS Take 1,000 Units by mouth daily.   Yes [provider]  diclofenac sodium (VOLTAREN) 1 % GEL Apply to affected area as needed twice daily. 09/11/16  Yes Rogue Bussing, MD  Fluticasone-Salmeterol (ADVAIR) 250-50 MCG/DOSE AEPB Inhale 1 puff into the lungs 2 (two) times daily.   Yes [provider]  furosemide (LASIX) 20 MG tablet Take 1 tablet (20 mg total) by mouth daily. 08/14/16  Yes Bacigalupo, Dionne Bucy, MD  hydrALAZINE (APRESOLINE) 25 MG tablet Take 25 mg by mouth 2 (two) times daily.   Yes [provider]  isosorbide mononitrate (ISMO,MONOKET) 10 MG tablet Take 10 mg by mouth daily.   Yes [provider]  lisinopril (PRINIVIL,ZESTRIL) 5 MG  tablet Take 5 mg by mouth daily.   Yes [provider]  omeprazole (PRILOSEC) 20 MG capsule Take 20 mg by mouth daily.   Yes [provider]  thiamine (VITAMIN B-1) 100 MG tablet Take 100 mg by mouth daily.   Yes [provider]    Allergies: No Known Allergies  Social History   Social History  . Marital status: Single    Spouse name: N/A  . Number of children: N/A  . Years of education: 44 (some college)    Occupational History  . disability    Social History Main Topics  . Smoking status: Former Smoker    Packs/day: 0.50    Types: Cigarettes  . Smokeless tobacco: Never Used     Comment: pt stated he quit smoking 1 week ago  . Alcohol use 1.8 oz/week    3 Cans of beer per week  . Drug use: Yes    Frequency: 2.0 times per week    Types: Marijuana  . Sexual activity: Yes    Partners: Female    Birth control/ protection: Condom   Other Topics Concern  . Not on file   Social History Narrative  . No narrative on file     Family History  Problem Relation Age of Onset  . Heart disease Neg Hx      ROS:  Please see the history of present illness.     All other systems reviewed and negative.    Physical Exam: Blood pressure 138/70, pulse 91, height 5\' 9"  (1.753 m), weight 191 lb (86.6 kg), SpO2 98 %. General: Well developed, well nourished male in no acute distress. Head: Normocephalic, atraumatic, sclera non-icteric, no xanthomas, nares are without discharge. EENT: normal  Lymph Nodes:  none Neck: Negative for carotid bruits. JVD 8-9 cm  Back:without scoliosis kyphosis Lungs: Clear bilaterally to auscultation without wheezes, rales, or rhonchi. Breathing is unlabored. Heart: Irregularly irregular rhythm with a 2/6 systolic murmur . No rubs, or gallops appreciated. Abdomen: Soft, non-tender, non-distended with normoactive bowel sounds. No hepatomegaly. No rebound/guarding. No obvious abdominal masses. Msk:  Strength and tone appear normal for age. Extremities: No clubbing or cyanosis.  1+  edema.  Distal pedal pulses are 2+ and equal bilaterally. Skin: Warm and Dry Neuro: Alert and oriented X 3. CN III-XII intact Grossly normal sensory and motor function . Psych:  Responds to questions appropriately with a normal affect.      Labs: Cardiac Enzymes No results for input(s): CKTOTAL, CKMB, TROPONINI in the last 72 hours. CBC Lab Results  Component Value Date   WBC 5.8  09/07/2016   HGB 10.4 (L) 09/07/2016   HCT 31.8 (L) 09/07/2016   MCV 97.8 09/07/2016   PLT 144 (L) 09/07/2016   PROTIME: No results for input(s): LABPROT, INR in the last 72 hours. Chemistry No results for input(s): NA, K, CL, CO2, BUN, CREATININE, CALCIUM, PROT, BILITOT, ALKPHOS, ALT, AST, GLUCOSE in the last 168 hours.  Invalid input(s): LABALBU Lipids Lab Results  Component Value Date   CHOL 96 09/06/2016   HDL 26 (L) 09/06/2016   LDLCALC 44 09/06/2016   TRIG 128 09/06/2016   BNP No results found for: PROBNP Thyroid Function Tests: No results for input(s): TSH, T4TOTAL, T3FREE, THYROIDAB in the last 72 hours.  Invalid input(s): FREET3 Miscellaneous No results found for: DDIMER  Radiology/Studies:  Ct Angio Head W Or Wo Contrast  Result Date: 09/05/2016 EXAM: CT ANGIOGRAPHY HEAD AND NECK CT PERFUSION BRAIN TECHNIQUE: Multidetector  CT imaging of the head and neck was performed using the standard protocol during bolus administration of intravenous contrast. Multiplanar CT image reconstructions and MIPs were obtained to evaluate the vascular anatomy. Carotid stenosis measurements (when applicable) are obtained utilizing NASCET criteria, using the distal internal carotid diameter as the denominator. Multiphase CT imaging of the brain was performed following IV bolus contrast injection. Subsequent parametric perfusion maps were calculated using RAPID software. COMPARISON:  None. FINDINGS: CTA NECK FINDINGS Aortic arch: Bovine arch. Imaged portion shows no evidence of aneurysm or dissection. No significant stenosis of the major arch vessel origins. Minimal calcific atherosclerosis of the aortic arch. Right carotid system: No evidence of dissection, stenosis (50% or greater) or occlusion. Moderate calcified plaque of the carotid bifurcation with minimal less than 30% proximal ICA stenosis. Left carotid system: No evidence of dissection, stenosis (50% or greater) or occlusion. Mild calcified  plaque of the bifurcation. Vertebral arteries: Codominant. No evidence of dissection, stenosis (50% or greater) or occlusion. Skeleton: Moderate cervical degenerative changes with upper cervical facet arthrosis greater on the right and discogenic degenerative disease greatest at the C4 through C7 levels. No high-grade bony canal stenosis. Other neck: Negative. Upper chest: 5 mm nodule in the right upper lobe (series 8, image 347). Review of the MIP images confirms the above findings CTA HEAD FINDINGS Anterior circulation: Left MCA superior division origin occlusion with intermediate collateralization. The point of occlusion is in the left anterior sylvian fissure corresponding to the hyperdensity on prior noncontrast CT of the head (series 10, image 147) and at the branching origin of the inferior division which makes an approximately 90 degrees angle downward turn relative to the M1 (series 13, image 31). Otherwise the anterior circulation is patent without high-grade stenosis, aneurysm, or occlusion. Calcified plaque of the carotid siphons with mild right-sided distal cavernous stenosis. Posterior circulation: No significant stenosis, proximal occlusion, aneurysm, or vascular malformation. Venous sinuses: As permitted by contrast timing, patent. Anatomic variants: Right larger than left posterior communicating artery is and diminutive patent anterior communicating artery. Review of the MIP images confirms the above findings CT Brain Perfusion Findings: CBF (<30%) Volume: 61mL Perfusion (Tmax>6.0s) volume: 14mL Mismatch Volume: 127mL Infarction Location:Perfusion anomaly is centered in the left lateral frontal and parietal lobes. IMPRESSION: CT perfusion: No infarct core (CBF less than 30%) by automated RAPID processing. At risk ischemia in the left superior MCA distribution with volume 108 cc. CTA head: 1. Left MCA superior division origin occlusion with intermediate collateralization. 2. Otherwise patent circle  of Willis without significant stenosis or aneurysm identified. CTA neck: 1. Patent carotid and vertebral arteries. No dissection, aneurysm, or hemodynamically significant stenosis is identified. 2. 5 mm right upper lobe pulmonary nodule. No follow-up needed if patient is low-risk. Non-contrast chest CT can be considered in 12 months if patient is high-risk. This recommendation follows the consensus statement: Guidelines for Management of Incidental Pulmonary Nodules Detected on CT Images: From the Fleischner Society 2017; Radiology 2017; 284:228-243. These results were called by telephone at the time of interpretation on 09/05/2016 at 9:09 pm to Dr. Cheral Marker, who verbally acknowledged these results. Electronically Signed   By: Kristine Garbe M.D.   On: 09/05/2016 21:26   Ct Head Wo Contrast  Result Date: 09/07/2016 CLINICAL DATA:  Stroke follow-up EXAM: CT HEAD WITHOUT CONTRAST TECHNIQUE: Contiguous axial images were obtained from the base of the skull through the vertex without intravenous contrast. COMPARISON:  Head CT 09/05/2016 FINDINGS: Brain: No mass lesion, intraparenchymal hemorrhage or extra-axial  collection. No evidence of acute cortical infarct. Brain parenchyma and CSF-containing spaces are normal for age. Vascular: No hyperdense vessel or unexpected calcification. Skull: Normal visualized skull base, calvarium and extracranial soft tissues. Sinuses/Orbits: No sinus fluid levels or advanced mucosal thickening. No mastoid effusion. Normal orbits. IMPRESSION: Unchanged examination without acute intracranial abnormality. Gray-white differentiation remains distinct. Electronically Signed   By: Ulyses Jarred M.D.   On: 09/07/2016 05:23   Ct Head Wo Contrast  Result Date: 09/06/2016 CLINICAL DATA:  Status post neurovascular intervention. EXAM: CT HEAD WITHOUT CONTRAST TECHNIQUE: Contiguous axial images were obtained from the base of the skull through the vertex without intravenous contrast.  COMPARISON:  CTA head neck 09/05/2016 CTP head 09/05/2016 FINDINGS: Brain: No mass lesion, intraparenchymal hemorrhage or extra-axial collection. No evidence of acute cortical infarct. There is periventricular hypoattenuation compatible with chronic microvascular disease. Vascular: No hyperdense vessel or unexpected calcification. Skull: Normal visualized skull base, calvarium and extracranial soft tissues. Sinuses/Orbits: No sinus fluid levels or advanced mucosal thickening. No mastoid effusion. Normal orbits. IMPRESSION: No hemorrhage or other acute abnormality following neurovascular intervention. Gray-white differentiation is preserved in the region of ischemia demonstrated on the earlier perfusion study. Electronically Signed   By: Ulyses Jarred M.D.   On: 09/06/2016 00:30   Ct Angio Neck W And/or Wo Contrast  Result Date: 09/05/2016 EXAM: CT ANGIOGRAPHY HEAD AND NECK CT PERFUSION BRAIN TECHNIQUE: Multidetector CT imaging of the head and neck was performed using the standard protocol during bolus administration of intravenous contrast. Multiplanar CT image reconstructions and MIPs were obtained to evaluate the vascular anatomy. Carotid stenosis measurements (when applicable) are obtained utilizing NASCET criteria, using the distal internal carotid diameter as the denominator. Multiphase CT imaging of the brain was performed following IV bolus contrast injection. Subsequent parametric perfusion maps were calculated using RAPID software. COMPARISON:  None. FINDINGS: CTA NECK FINDINGS Aortic arch: Bovine arch. Imaged portion shows no evidence of aneurysm or dissection. No significant stenosis of the major arch vessel origins. Minimal calcific atherosclerosis of the aortic arch. Right carotid system: No evidence of dissection, stenosis (50% or greater) or occlusion. Moderate calcified plaque of the carotid bifurcation with minimal less than 30% proximal ICA stenosis. Left carotid system: No evidence of  dissection, stenosis (50% or greater) or occlusion. Mild calcified plaque of the bifurcation. Vertebral arteries: Codominant. No evidence of dissection, stenosis (50% or greater) or occlusion. Skeleton: Moderate cervical degenerative changes with upper cervical facet arthrosis greater on the right and discogenic degenerative disease greatest at the C4 through C7 levels. No high-grade bony canal stenosis. Other neck: Negative. Upper chest: 5 mm nodule in the right upper lobe (series 8, image 347). Review of the MIP images confirms the above findings CTA HEAD FINDINGS Anterior circulation: Left MCA superior division origin occlusion with intermediate collateralization. The point of occlusion is in the left anterior sylvian fissure corresponding to the hyperdensity on prior noncontrast CT of the head (series 10, image 147) and at the branching origin of the inferior division which makes an approximately 90 degrees angle downward turn relative to the M1 (series 13, image 31). Otherwise the anterior circulation is patent without high-grade stenosis, aneurysm, or occlusion. Calcified plaque of the carotid siphons with mild right-sided distal cavernous stenosis. Posterior circulation: No significant stenosis, proximal occlusion, aneurysm, or vascular malformation. Venous sinuses: As permitted by contrast timing, patent. Anatomic variants: Right larger than left posterior communicating artery is and diminutive patent anterior communicating artery. Review of the MIP images confirms the above  findings CT Brain Perfusion Findings: CBF (<30%) Volume: 16mL Perfusion (Tmax>6.0s) volume: 164mL Mismatch Volume: 119mL Infarction Location:Perfusion anomaly is centered in the left lateral frontal and parietal lobes. IMPRESSION: CT perfusion: No infarct core (CBF less than 30%) by automated RAPID processing. At risk ischemia in the left superior MCA distribution with volume 108 cc. CTA head: 1. Left MCA superior division origin  occlusion with intermediate collateralization. 2. Otherwise patent circle of Willis without significant stenosis or aneurysm identified. CTA neck: 1. Patent carotid and vertebral arteries. No dissection, aneurysm, or hemodynamically significant stenosis is identified. 2. 5 mm right upper lobe pulmonary nodule. No follow-up needed if patient is low-risk. Non-contrast chest CT can be considered in 12 months if patient is high-risk. This recommendation follows the consensus statement: Guidelines for Management of Incidental Pulmonary Nodules Detected on CT Images: From the Fleischner Society 2017; Radiology 2017; 284:228-243. These results were called by telephone at the time of interpretation on 09/05/2016 at 9:09 pm to Dr. Cheral Marker, who verbally acknowledged these results. Electronically Signed   By: Kristine Garbe M.D.   On: 09/05/2016 21:26   Ct Cerebral Perfusion W Contrast  Result Date: 09/05/2016 EXAM: CT ANGIOGRAPHY HEAD AND NECK CT PERFUSION BRAIN TECHNIQUE: Multidetector CT imaging of the head and neck was performed using the standard protocol during bolus administration of intravenous contrast. Multiplanar CT image reconstructions and MIPs were obtained to evaluate the vascular anatomy. Carotid stenosis measurements (when applicable) are obtained utilizing NASCET criteria, using the distal internal carotid diameter as the denominator. Multiphase CT imaging of the brain was performed following IV bolus contrast injection. Subsequent parametric perfusion maps were calculated using RAPID software. COMPARISON:  None. FINDINGS: CTA NECK FINDINGS Aortic arch: Bovine arch. Imaged portion shows no evidence of aneurysm or dissection. No significant stenosis of the major arch vessel origins. Minimal calcific atherosclerosis of the aortic arch. Right carotid system: No evidence of dissection, stenosis (50% or greater) or occlusion. Moderate calcified plaque of the carotid bifurcation with minimal less than  30% proximal ICA stenosis. Left carotid system: No evidence of dissection, stenosis (50% or greater) or occlusion. Mild calcified plaque of the bifurcation. Vertebral arteries: Codominant. No evidence of dissection, stenosis (50% or greater) or occlusion. Skeleton: Moderate cervical degenerative changes with upper cervical facet arthrosis greater on the right and discogenic degenerative disease greatest at the C4 through C7 levels. No high-grade bony canal stenosis. Other neck: Negative. Upper chest: 5 mm nodule in the right upper lobe (series 8, image 347). Review of the MIP images confirms the above findings CTA HEAD FINDINGS Anterior circulation: Left MCA superior division origin occlusion with intermediate collateralization. The point of occlusion is in the left anterior sylvian fissure corresponding to the hyperdensity on prior noncontrast CT of the head (series 10, image 147) and at the branching origin of the inferior division which makes an approximately 90 degrees angle downward turn relative to the M1 (series 13, image 31). Otherwise the anterior circulation is patent without high-grade stenosis, aneurysm, or occlusion. Calcified plaque of the carotid siphons with mild right-sided distal cavernous stenosis. Posterior circulation: No significant stenosis, proximal occlusion, aneurysm, or vascular malformation. Venous sinuses: As permitted by contrast timing, patent. Anatomic variants: Right larger than left posterior communicating artery is and diminutive patent anterior communicating artery. Review of the MIP images confirms the above findings CT Brain Perfusion Findings: CBF (<30%) Volume: 40mL Perfusion (Tmax>6.0s) volume: 18mL Mismatch Volume: 168mL Infarction Location:Perfusion anomaly is centered in the left lateral frontal and parietal lobes. IMPRESSION:  CT perfusion: No infarct core (CBF less than 30%) by automated RAPID processing. At risk ischemia in the left superior MCA distribution with volume  108 cc. CTA head: 1. Left MCA superior division origin occlusion with intermediate collateralization. 2. Otherwise patent circle of Willis without significant stenosis or aneurysm identified. CTA neck: 1. Patent carotid and vertebral arteries. No dissection, aneurysm, or hemodynamically significant stenosis is identified. 2. 5 mm right upper lobe pulmonary nodule. No follow-up needed if patient is low-risk. Non-contrast chest CT can be considered in 12 months if patient is high-risk. This recommendation follows the consensus statement: Guidelines for Management of Incidental Pulmonary Nodules Detected on CT Images: From the Fleischner Society 2017; Radiology 2017; 284:228-243. These results were called by telephone at the time of interpretation on 09/05/2016 at 9:09 pm to Dr. Cheral Marker, who verbally acknowledged these results. Electronically Signed   By: Kristine Garbe M.D.   On: 09/05/2016 21:26   Portable Chest Xray  Result Date: 09/06/2016 CLINICAL DATA:  Respiratory failure, post intubation EXAM: PORTABLE CHEST 1 VIEW COMPARISON:  None. FINDINGS: The tip of an endotracheal tube is 4.4 cm above the carina. Heart is enlarged with mild vascular congestion. No aortic aneurysm. There is aortic atherosclerosis. No pneumothorax or pulmonary consolidation. No significant effusions. Left-sided ICD device with right ventricular lead is identified. A gastric tube extends below the left hemidiaphragm and appears to be coiled in the expected location of the stomach. Old right third and fourth rib fractures. IMPRESSION: 1. Endotracheal tube tip in satisfactory position 4.4 cm above the carina. Gastric tube in the stomach. 2. Cardiomegaly with mild vascular congestion noted. Electronically Signed   By: Ashley Royalty M.D.   On: 09/06/2016 00:50   Ir Percutaneous Art Thrombectomy/infusion Intracranial Inc Diag Angio  Result Date: 09/07/2016 INDICATION: Acute onset of global aphasia and right-sided weakness. Abnormal  CT angiogram of the head and neck. EXAM: 1. EMERGENT LARGE VESSEL OCCLUSION THROMBOLYSIS (anterior CIRCULATION) COMPARISON:  CT angiogram of the head and neck of 09/05/2016. MEDICATIONS: Ancef 1 g IV antibiotic was administered within 1 hour of the procedure. ANESTHESIA/SEDATION: General anesthesia. CONTRAST:  Isovue 300 approximately 75 mL. FLUOROSCOPY TIME:  Fluoroscopy Time: 17 minutes 12 seconds (1677 mGy). COMPLICATIONS: None immediate. TECHNIQUE: Following a full explanation of the procedure along with the potential associated complications, an informed witnessed consent was obtained from the patient's Godson . The risks of intracranial hemorrhage of 10%, worsening neurological deficit, ventilator dependency, death and inability to revascularize were all reviewed in detail with the patient's Godson. The patient was then put under general anesthesia by the Department of Anesthesiology at [facility]. The right groin was prepped and draped in the usual sterile fashion. Thereafter using modified Seldinger technique, transfemoral access into the right common femoral artery was obtained without difficulty. Over a 0.035 inch guidewire a 5 French Pinnacle sheath was inserted. Through this, and also over a 0.035 inch guidewire a 5 [place] JB 1 catheter was advanced to the aortic arch region and selectively positioned in the the left common carotid artery. FINDINGS: The left common carotid arteriogram demonstrates the left external carotid artery and its major branches to be widely patent. The left internal carotid artery at the bulb to the cranial skull base opacifies normally. The petrous, cavernous and supraclinoid segments opacify normally. The left middle cerebral artery M1 segment and the inferior divisions opacify into the capillary and venous phases. There is a large hyperperfusion noted in the superior division of the left middle cerebral  artery with delayed fill-in from collaterals from the left  pericallosal artery. A stump of an occluded prominent superior division of the left middle cerebral artery is noted. PROCEDURE: The diagnostic JB 1 catheter in the left common carotid artery was exchanged over a 0.035 inch 300 cm Rosen exchange guidewire for an 8 French 55 cm Brite tip neurovascular sheath using biplane roadmap technique and constant fluoroscopic guidance. Good aspiration obtained from the hub of the 8 French neurovascular catheter. This was then connected to continuous heparinized saline infusion. Over the [facility] exchange guidewire, a 85 cm 8 [place] FlowGate balloon guide catheter which had been prepped with 50% contrast and 50% heparinized saline infusion was advanced and positioned just proximal to the left common carotid bifurcation. The guidewire was removed. Good aspiration obtained from the hub of the 8 [place] FlowGate guide catheter. Over a 0.035 inch Roadrunner guidewire, using biplane roadmap technique and constant fluoroscopic guidance, the FlowGate guide catheter was advanced to the distal cervical segment of the left internal carotid artery. The guidewire was removed. Good aspiration obtained from the hub of the 8 [place] FlowGate guide catheter. A control arteriogram performed centered intracranially demonstrated no change in the intracranial circulation. At this time, in a coaxial manner and with constant heparinized saline infusion using biplane roadmap technique and constant fluoroscopic guidance, the combination of a 6 French 132 cm Catalyst guide catheter inside of which was a 0.021 Trevo ProVue microcatheter combination was advanced over a 0.014 inch Softip Synchro micro guidewire to the distal end of the [facility] guide catheter. With the micro guidewire leading with a J-tip configuration, the combination was navigated with a torque device to the supraclinoid left ICA. Thereafter, the wire was advanced through the occluded codominant superior division of the left middle cerebral  artery into the M2 M3 region followed by the microcatheter. The 6 [place] Catalyst guide catheter was positioned in the proximal M1 segment. A 4 mm x 40 mm Solitaire FR retrieval device was then advanced to the distal portion of the Trevo ProVue microcatheter. The O ring on the delivery microcatheter was loosened. With slight forward traction with the right hand on the delivery micro guidewire, with the left hand the delivery microcatheter was retrieved unsheathing the retrieval Solitaire FR 4 mm x 40 mm retrieval device completely. A control arteriogram performed through the 6 [place] Catalyst guide catheter in the left internal carotid artery demonstrated no significant opacification of the superior division of the left middle cerebral artery. The proximal portion of the retrieval device was captured into the distal microcatheter. Thereafter, after having inflated the occlusion balloon in the left internal carotid artery of the [facility] guide catheter, the combination of the retrieval device, the microcatheter, and also of the 6 French 132 cm Catalyst guide catheter was retrieved as constant aspiration was applied with a 60 mL syringe at the hub of the [facility] guide catheter, and also at the hub of the 6 [place] Catalyst guide catheter. This combination was gently retrieved and removed while aspiration was continued. The interstices of the retrieval device had chunks of clot as well as in the Tuohy Ellis. Aspiration was continued as the balloon was deflated in the left internal carotid artery. Free back bleed of blood was noted. A control arteriogram performed through the [facility] guide catheter in the left internal carotid artery demonstrated complete angiographic revascularization of the superior division of the left middle cerebral artery. The left anterior cerebral artery and the inferior division remained widely patent. There was  moderate spasm noted in the distal M1 segment which responded to 25 mics of  nitroglycerin x 3 via the guide catheter in the left internal carotid artery. A final control arteriogram performed through the [facility] guide catheter in the left internal carotid artery demonstrated complete angiographic revascularization of the left middle cerebral artery and the left anterior cerebral artery. A TICI 3 reperfusion was achieved. Throughout the procedure, the patient's blood pressure and neurological status remained stable. No angiographic evidence of mass-effect or extravasation was seen. The 8 [place] FlowGate guide catheter and the 8 [place] Brite tip neurovascular sheath were then retrieved into the abdominal aorta and exchanged over a J-tip guidewire for a 9 [place] Pinnacle sheath. This was then connected to continuous heparinized saline infusion. The right groin appeared soft without evidence of a hematoma. The distal pulses remained 2+ palpable in the dorsalis pedis bilaterally, and both posterior tibials were 1+ unchanged from prior to the procedure. IMPRESSION: Endovascular complete revascularization of the superior division of the left middle cerebral artery with 1 pass of the 4 mm x 40 mm Solitaire FR retrieval device, achieving a TICI 3 reperfusion. Time from groin puncture to TICI 3 reperfusion 49 minutes. PLAN: The patient then transferred to CT scanner for postprocedural CT scan of the brain. Electronically Signed   By: Luanne Bras M.D.   On: 09/06/2016 12:39   Ct Head Code Stroke W/o Cm  Result Date: 09/05/2016 CLINICAL DATA:  Code stroke.  Aphasia EXAM: CT HEAD WITHOUT CONTRAST TECHNIQUE: Contiguous axial images were obtained from the base of the skull through the vertex without intravenous contrast. COMPARISON:  None. FINDINGS: Brain: Generalized atrophy. Negative for hydrocephalus. Negative for acute infarct. No intracranial hemorrhage or mass. Vascular: Short segment hyperdensity left M2 segment compatible with acute thrombus. No other hyperdense vessel. Atherosclerotic  disease in the cavernous carotid bilaterally. Skull: Negative Sinuses/Orbits: Negative Other: None ASPECTS ([place] Stroke Program Early CT Score) - Ganglionic level infarction (caudate, lentiform nuclei, internal capsule, insula, M1-M3 cortex): 7 - Supraganglionic infarction (M4-M6 cortex): 3 Total score (0-10 with 10 being normal): 10 IMPRESSION: 1. Negative for acute infarct or hemorrhage. Hyperdense left M2 segment compatible with acute thrombosis. 2. ASPECTS is 10 These results were called by telephone at the time of interpretation on 09/05/2016 at 8:40 pm to Dr. Cheral Marker, who verbally acknowledged these results. Electronically Signed   By: Franchot Gallo M.D.   On: 09/05/2016 20:41    EKG: ECG from 7/26 was reviewed. Atrial fibrillation with variable ventricular response and a narrow QRS   Assessment and Plan:  Cardiomyopathy presumed nonischemic question rate related  Atrial fibrillation-long-term persistent  Congestive heart failure-acute on chronic-class III  Implantable defibrillator-[employer]  Stroke-recent  Hypertension/hypertensive heart disease   The patient has persistent nonischemic cardiomyopathy. Device interrogation demonstrates rapid atrial rates with averages over 100 bpm. He also has acute on chronic heart failure. Atrial fibrillation is likely contributing to his heart failure both directly and indirectly, i.e. with rapid rates and loss of atrial contractility as well as potentially contributing to his cardiomyopathy.  First we will try and slow down his ventricular response. We will add low-dose diltiazem, not withstanding the fact that he has congestive heart failure. This is both elevated and the European guidelines but also will based on the presumption that his atrial fibrillation rate is contributing to his heart failure following this down as potential significant impact.  I would also anticipate attempting cardioversion 1.  His left atrial size has quite  discordant measurements. I am not sanguine that cardioversion would result in maintaining sinus rhythm; however, get sinus rhythm and potentially the dofetilide or amiodarone might be beneficial. His bradycardia on his AV conduction histograms does not inform anything about his sinus rate.   With his congestive heart failure, we'll increase his furosemide from 20-40 daily. I would like to have him come back and see [facility] in a couple of weeks and if he is euvolemic would like to schedule a cardioversion.  His device has been reprogrammed to minimize long shorts, to improve discrimination and avoid inappropriate therapy for atrial fibrillation.  Virl Axe

## 2016-10-11 NOTE — Anesthesia Postprocedure Evaluation (Signed)
Anesthesia Post Note  Patient: Glenn [name]  Procedure(s) Performed: * No procedures listed *     Patient location during evaluation: PACU Anesthesia Type: MAC Level of consciousness: awake and alert Pain management: pain level controlled Vital Signs Assessment: post-procedure vital signs reviewed and stable Respiratory status: spontaneous breathing, nonlabored ventilation, respiratory function stable and patient connected to nasal cannula oxygen Cardiovascular status: stable and blood pressure returned to baseline Anesthetic complications: no                Dmari Schubring

## 2016-10-12 NOTE — Anesthesia Postprocedure Evaluation (Signed)
Anesthesia Post Note  Patient: Glenn [name]  Procedure(s) Performed: Procedure(s) (LRB): CARDIOVERSION (N/A)     Patient location during evaluation: PACU Level of consciousness: awake and alert Pain management: pain level controlled Vital Signs Assessment: post-procedure vital signs reviewed and stable Respiratory status: spontaneous breathing, nonlabored ventilation, respiratory function stable and patient connected to nasal cannula oxygen Cardiovascular status: blood pressure returned to baseline and stable Postop Assessment: no signs of nausea or vomiting Anesthetic complications: no    Last Vitals:  Vitals:   10/10/16 1400 10/10/16 1410  BP: 126/66 125/72  Pulse: 77 73  Resp: 17 18  Temp:    SpO2: 97% 98%    Last Pain:  Vitals:   10/10/16 1340  TempSrc: Oral                 [name]

## 2016-10-12 NOTE — Addendum Note (Signed)
Addendum  created 10/12/16 0539 by Janeece Riggers, MD   Sign clinical note

## 2016-11-13 ENCOUNTER — Encounter: Payer: Self-pay | Admitting: Neurology

## 2016-11-13 ENCOUNTER — Ambulatory Visit (INDEPENDENT_AMBULATORY_CARE_PROVIDER_SITE_OTHER): Payer: Medicaid Other | Admitting: Neurology

## 2016-11-13 VITALS — BP 100/56 | HR 55 | Ht 69.0 in | Wt 188.2 lb

## 2016-11-13 DIAGNOSIS — I63412 Cerebral infarction due to embolism of left middle cerebral artery: Secondary | ICD-10-CM

## 2016-11-13 NOTE — Patient Instructions (Signed)
I had a long d/w patient about his recent embolic stroke, atrial fibrillation,risk for recurrent stroke/TIAs, personally independently reviewed imaging studies and stroke evaluation results and answered questions.Continue Eliquis (apixaban) daily  for secondary stroke prevention and maintain strict control of hypertension with blood pressure goal below 130/90, diabetes with hemoglobin A1c goal below 6.5% and lipids with LDL cholesterol goal below 70 mg/dL. I also advised the patient to eat a healthy diet with plenty of whole grains, cereals, fruits and vegetables, exercise regularly and maintain ideal body weight .I have strongly encouraged him to quit smoking completely and he is willing to do so. Followup in the future with my  nur practitioner in 6 months or call earlier if necessary Stroke Prevention Some medical conditions and behaviors are associated with an increased chance of having a stroke. You may prevent a stroke by making healthy choices and managing medical conditions. How can I reduce my risk of having a stroke?  Stay physically active. Get at least 30 minutes of activity on most or all days.  Do not smoke. It may also be helpful to avoid exposure to secondhand smoke.  Limit alcohol use. Moderate alcohol use is considered to be: ? No more than 2 drinks per day for men. ? No more than 1 drink per day for nonpregnant women.  Eat healthy foods. This involves: ? Eating 5 or more servings of fruits and vegetables a day. ? Making dietary changes that address high blood pressure (hypertension), high cholesterol, diabetes, or obesity.  Manage your cholesterol levels. ? Making food choices that are high in fiber and low in saturated fat, trans fat, and cholesterol may control cholesterol levels. ? Take any prescribed medicines to control cholesterol as directed by your health care provider.  Manage your diabetes. ? Controlling your carbohydrate and sugar intake is recommended to manage  diabetes. ? Take any prescribed medicines to control diabetes as directed by your health care provider.  Control your hypertension. ? Making food choices that are low in salt (sodium), saturated fat, trans fat, and cholesterol is recommended to manage hypertension. ? Ask your health care provider if you need treatment to lower your blood pressure. Take any prescribed medicines to control hypertension as directed by your health care provider. ? If you are 67-74 years of age, have your blood pressure checked every 3-5 years. If you are 3 years of age or older, have your blood pressure checked every year.  Maintain a healthy weight. ? Reducing calorie intake and making food choices that are low in sodium, saturated fat, trans fat, and cholesterol are recommended to manage weight.  Stop drug abuse.  Avoid taking birth control pills. ? Talk to your health care provider about the risks of taking birth control pills if you are over 43 years old, smoke, get migraines, or have ever had a blood clot.  Get evaluated for sleep disorders (sleep apnea). ? Talk to your health care provider about getting a sleep evaluation if you snore a lot or have excessive sleepiness.  Take medicines only as directed by your health care provider. ? For some people, aspirin or blood thinners (anticoagulants) are helpful in reducing the risk of forming abnormal blood clots that can lead to stroke. If you have the irregular heart rhythm of atrial fibrillation, you should be on a blood thinner unless there is a good reason you cannot take them. ? Understand all your medicine instructions.  Make sure that other conditions (such as anemia or atherosclerosis) are  addressed. Get help right away if:  You have sudden weakness or numbness of the face, arm, or leg, especially on one side of the body.  Your face or eyelid droops to one side.  You have sudden confusion.  You have trouble speaking (aphasia) or  understanding.  You have sudden trouble seeing in one or both eyes.  You have sudden trouble walking.  You have dizziness.  You have a loss of balance or coordination.  You have a sudden, severe headache with no known cause.  You have new chest pain or an irregular heartbeat. Any of these symptoms may represent a serious problem that is an emergency. Do not wait to see if the symptoms will go away. Get medical help at once. Call your local emergency services (911 in [place] Do not drive yourself to the hospital. This information is not intended to replace advice given to you by your health care provider. Make sure you discuss any questions you have with your health care provider. Document Released: 03/15/2004 Document Revised: 07/14/2015 Document Reviewed: 08/08/2012 Elsevier Interactive Patient Education  2017 [employer].

## 2016-12-19 ENCOUNTER — Other Ambulatory Visit: Payer: Self-pay

## 2016-12-19 ENCOUNTER — Encounter: Payer: Medicaid Other | Admitting: *Deleted

## 2016-12-19 NOTE — Patient Outreach (Signed)
Telephone outreach to patient to obtain mRS was successfully completed. mRS = 3

## 2016-12-21 ENCOUNTER — Encounter: Payer: Self-pay | Admitting: Cardiology

## 2017-02-14 ENCOUNTER — Emergency Department (HOSPITAL_COMMUNITY)
Admission: EM | Admit: 2017-02-14 | Discharge: 2017-02-15 | Disposition: A | Discharge status: Home / Self Care | Payer: Medicaid Other | Attending: Emergency Medicine | Admitting: Emergency Medicine

## 2017-02-14 ENCOUNTER — Emergency Department (HOSPITAL_COMMUNITY): Payer: Medicaid Other

## 2017-02-14 ENCOUNTER — Encounter (HOSPITAL_COMMUNITY): Payer: Self-pay

## 2017-02-14 DIAGNOSIS — I110 Hypertensive heart disease with heart failure: Secondary | ICD-10-CM | POA: Diagnosis not present

## 2017-02-14 DIAGNOSIS — I509 Heart failure, unspecified: Secondary | ICD-10-CM | POA: Insufficient documentation

## 2017-02-14 DIAGNOSIS — M25512 Pain in left shoulder: Secondary | ICD-10-CM | POA: Diagnosis not present

## 2017-02-14 DIAGNOSIS — I4891 Unspecified atrial fibrillation: Secondary | ICD-10-CM | POA: Diagnosis not present

## 2017-02-14 DIAGNOSIS — R072 Precordial pain: Secondary | ICD-10-CM | POA: Diagnosis not present

## 2017-02-14 DIAGNOSIS — Z8673 Personal history of transient ischemic attack (TIA), and cerebral infarction without residual deficits: Secondary | ICD-10-CM | POA: Insufficient documentation

## 2017-02-14 DIAGNOSIS — F17210 Nicotine dependence, cigarettes, uncomplicated: Secondary | ICD-10-CM | POA: Insufficient documentation

## 2017-02-14 DIAGNOSIS — R079 Chest pain, unspecified: Secondary | ICD-10-CM | POA: Diagnosis present

## 2017-02-14 LAB — CBC WITH DIFFERENTIAL/PLATELET
BASOS ABS: 0 10*3/uL (ref 0.0–0.1)
Basophils Relative: 0 %
EOS PCT: 5 %
Eosinophils Absolute: 0.2 10*3/uL (ref 0.0–0.7)
HCT: 40 % (ref 39.0–52.0)
Hemoglobin: 13.5 g/dL (ref 13.0–17.0)
LYMPHS PCT: 47 %
Lymphs Abs: 1.6 10*3/uL (ref 0.7–4.0)
MCH: 32.7 pg (ref 26.0–34.0)
MCHC: 33.8 g/dL (ref 30.0–36.0)
MCV: 96.9 fL (ref 78.0–100.0)
Monocytes Absolute: 0.4 10*3/uL (ref 0.1–1.0)
Monocytes Relative: 12 %
NEUTROS ABS: 1.2 10*3/uL — AB (ref 1.7–7.7)
NEUTROS PCT: 36 %
PLATELETS: 163 10*3/uL (ref 150–400)
RBC: 4.13 MIL/uL — AB (ref 4.22–5.81)
RDW: 13 % (ref 11.5–15.5)
WBC: 3.3 10*3/uL — AB (ref 4.0–10.5)

## 2017-02-14 LAB — I-STAT TROPONIN, ED
TROPONIN I, POC: 0 ng/mL (ref 0.00–0.08)
TROPONIN I, POC: 0.01 ng/mL (ref 0.00–0.08)

## 2017-02-14 LAB — BASIC METABOLIC PANEL
ANION GAP: 9 (ref 5–15)
BUN: 14 mg/dL (ref 6–20)
CO2: 27 mmol/L (ref 22–32)
Calcium: 9.2 mg/dL (ref 8.9–10.3)
Chloride: 105 mmol/L (ref 101–111)
Creatinine, Ser: 1.35 mg/dL — ABNORMAL HIGH (ref 0.61–1.24)
GFR calc Af Amer: >60 mL/min (ref >60)
GFR, EST NON AFRICAN AMERICAN: 54 mL/min — AB (ref >60)
GLUCOSE: 88 mg/dL (ref 65–99)
POTASSIUM: 4.4 mmol/L (ref 3.5–5.1)
Sodium: 141 mmol/L (ref 135–145)

## 2017-02-14 MED ORDER — TRAMADOL HCL 50 MG PO TABS: 50.0000 mg | ORAL_TABLET | Freq: Four times a day (QID) | ORAL | 0 refills | Status: DC | PRN

## 2017-02-14 NOTE — Discharge Instructions (Signed)

## 2017-02-14 NOTE — ED Notes (Signed)
Patient requested RN to call son. RN called son in room, Son [name]) made aware plan of care.

## 2017-02-14 NOTE — ED Provider Notes (Signed)
Emergency Department Provider Note   I have reviewed the triage vital signs and the nursing notes.   HISTORY  Chief Complaint No chief complaint on file.   HPI Glenn [name] is a 66 y.o. male with PMH of a-fib, CHF, hepatitis, and CVA presents to the emergency department for evaluation of left shoulder pain.  The patient has had 2 days of constant symptoms.  Describes it as a soreness in the left chest near his defibrillator and radiating to the shoulder.  Pain is worse with movement of the shoulder or pressing in that area.  He went to his primary care physician today who referred him to the emergency department for further evaluation.  No fevers, chills, productive cough.  No exertional or pleuritic symptoms.  No other modifying factors or radiation. No diaphoresis.    Past Medical History:  Diagnosis Date  . Atrial fibrillation ([place])   . CHF (congestive heart failure) ([place])   . Hepatitis C   . Hypertension   . Stroke [facility])     Patient Active Problem List   Diagnosis Date Noted  . ICD (implantable cardioverter-defibrillator) in place 09/13/2016  . Chest pain 09/13/2016  . Tobacco abuse 09/13/2016  . Hospital discharge follow-up 09/13/2016  . Upper back pain 09/13/2016  . Housing problems 09/13/2016  . History of hepatitis C 09/13/2016  . Persistent atrial fibrillation ([place])   . Atrial fibrillation with RVR ([place])   . Ischemic cardiomyopathy   . Cerebral infarction ([place])   . Stroke (cerebrum) ([place]) 09/05/2016  . CHF (congestive heart failure) ([place]) 08/14/2016  . HTN (hypertension) 08/14/2016  . Hepatitis C 08/14/2016    Past Surgical History:  Procedure Laterality Date  . CARDIAC DEFIBRILLATOR PLACEMENT  2015  . CARDIOVERSION N/A 10/10/2016   Procedure: CARDIOVERSION;  Surgeon: Dorothy Spark, MD;  Location: [facility];  Service: Cardiovascular;  Laterality: N/A;  . EYE SURGERY Left 1990  . IR PERCUTANEOUS ART THROMBECTOMY/INFUSION INTRACRANIAL INC DIAG  ANGIO  09/05/2016  . IR RADIOLOGIST EVAL & MGMT  10/03/2016  . RADIOLOGY WITH ANESTHESIA N/A 09/05/2016   Procedure: RADIOLOGY WITH ANESTHESIA;  Surgeon: Luanne Bras, MD;  Location: [place];  Service: Radiology;  Laterality: N/A;    Current Outpatient Rx  . Order #: [id number] Class: Normal  . Order #: [id number] Class: Historical Med  . Order #: [id number] Class: Normal  . Order #: [id number] Class: Historical Med  . Order #: [id number] Class: Normal  . Order #: [id number] Class: Normal  . Order #: [id number] Class: Historical Med  . Order #: [id number] Class: Normal  . Order #: [id number] Class: Normal  . Order #: [id number] Class: Historical Med  . Order #: [id number] Class: Normal  . Order #: [id number] Class: Normal  . Order #: [id number] Class: Print    Allergies Patient has no known allergies.  Family History  Problem Relation Age of Onset  . Stroke Maternal Aunt   . Heart disease Neg Hx     Social History Social History   Tobacco Use  . Smoking status: Current Some Day Smoker    Packs/day: 0.50    Types: Cigarettes  . Smokeless tobacco: Never Used  . Tobacco comment: a pack will last a week  Substance Use Topics  . Alcohol use: Yes    Alcohol/week: 1.8 oz    Types: 3 Cans of beer per week    Comment: weekends, a couple   . Drug use: Yes    Frequency: 2.0 times per week    Types: Marijuana    Comment: for pain  and leisure     Review of Systems  Constitutional: No fever/chills Eyes: No visual changes. ENT: No sore throat. Cardiovascular: Positive chest pain radiating to the left shoulder.  Respiratory: Denies shortness of breath. Gastrointestinal: No abdominal pain.  No nausea, no vomiting.  No diarrhea.  No constipation. Genitourinary: Negative for dysuria. Musculoskeletal: Negative for back pain. Skin: Negative for rash. Neurological: Negative for headaches, focal weakness or numbness.  10-point ROS otherwise  negative.  ____________________________________________   PHYSICAL EXAM:  VITAL SIGNS: ED Triage Vitals  Enc Vitals Group     BP 02/14/17 1527 122/81     Pulse Rate 02/14/17 1527 92     Resp 02/14/17 1527 18     Temp 02/14/17 1527 98.5 F (36.9 C)     Temp Source 02/14/17 1527 Oral     SpO2 02/14/17 1527 98 %     Weight 02/14/17 1528 182 lb (82.6 kg)     Height 02/14/17 1528 5\' 7"  (1.702 m)     Pain Score 02/14/17 1527 6   Constitutional: Alert and oriented. Well appearing and in no acute distress. Eyes: Conjunctivae are normal.  Head: Atraumatic. Nose: No congestion/rhinnorhea. Mouth/Throat: Mucous membranes are moist.  Neck: No stridor.  Cardiovascular: Normal rate, regular rhythm. Good peripheral circulation. Grossly normal heart sounds.   Respiratory: Normal respiratory effort.  No retractions. Lungs CTAB. Gastrointestinal: Soft and nontender. No distention.  Musculoskeletal: No lower extremity tenderness nor edema. No gross deformities of extremities. Tenderness to palpation over the left upper chest and anterior left shoulder.  Neurologic:  Normal speech and language. No gross focal neurologic deficits are appreciated.  Skin:  Skin is warm, dry and intact. No rash noted. No erythema or warmth over the defibrillator.   ____________________________________________   LABS (all labs ordered are listed, but only abnormal results are displayed)  Labs Reviewed  CBC WITH DIFFERENTIAL/PLATELET - Abnormal; Notable for the following components:      Result Value   WBC 3.3 (*)    RBC 4.13 (*)    Neutro Abs 1.2 (*)    All other components within normal limits  BASIC METABOLIC PANEL - Abnormal; Notable for the following components:   Creatinine, Ser 1.35 (*)    GFR calc non Af Amer 54 (*)    All other components within normal limits  I-STAT TROPONIN, ED  I-STAT TROPONIN, ED   ____________________________________________  EKG   EKG  Interpretation  Date/Time:  Thursday February 14 2017 15:26:39 EST Ventricular Rate:  90 PR Interval:    QRS Duration: 98 QT Interval:  372 QTC Calculation: 455 R Axis:   47 Text Interpretation:  Atrial fibrillation Minimal voltage criteria for LVH, may be normal variant Nonspecific T wave abnormality Abnormal ECG No STEM.  Confirmed by Nanda Quinton [phone]) on 02/14/2017 4:09:52 PM       ____________________________________________  RADIOLOGY  Dg Chest 2 View  Result Date: 02/14/2017 CLINICAL DATA:  Left-sided chest pain for the past day. EXAM: CHEST  2 VIEW COMPARISON:  Chest x-ray dated September 06, 2016. FINDINGS: Left chest wall AICD with single lead terminating in the right ventricle, unchanged. Stable cardiomegaly. Normal pulmonary vascularity. No focal consolidation, pleural effusion, or pneumothorax. No acute osseous abnormality. IMPRESSION: Stable cardiomegaly.  No active cardiopulmonary disease. Electronically Signed   By: Titus Dubin M.D.   On: 02/14/2017 16:01    ____________________________________________   PROCEDURES  Procedure(s) performed:   Procedures  None ____________________________________________   INITIAL IMPRESSION / ASSESSMENT AND PLAN /  ED COURSE  Pertinent labs & imaging results that were available during my care of the patient were reviewed by me and considered in my medical decision making (see chart for details).   Patient presents to the emergency department with left chest and shoulder discomfort.  Mild to reproduce the pain with movement of the shoulder and pressing over the left anterior shoulder.  There is no sign of infection overlying the patient's defibrillator.  Constant symptoms for the past 3 days.   Differential includes all life-threatening causes for chest pain. This includes but is not exclusive to acute coronary syndrome, aortic dissection, pulmonary embolism, cardiac tamponade, community-acquired pneumonia, pericarditis,  musculoskeletal chest wall pain, etc.  08:03 PM Repeat troponin negative. Plan for discharge home with tramadol and plan for Cardiology follow up ASAP.   At this time, I do not feel there is any life-threatening condition present. I have reviewed and discussed all results (EKG, imaging, lab, urine as appropriate), exam findings with patient. I have reviewed nursing notes and appropriate previous records.  I feel the patient is safe to be discharged home without further emergent workup. Discussed usual and customary return precautions. Patient and family (if present) verbalize understanding and are comfortable with this plan.  Patient will follow-up with their primary care provider. If they do not have a primary care provider, information for follow-up has been provided to them. All questions have been answered.   ____________________________________________  FINAL CLINICAL IMPRESSION(S) / ED DIAGNOSES  Final diagnoses:  Precordial chest pain  Acute pain of left shoulder    NEW OUTPATIENT MEDICATIONS STARTED DURING THIS VISIT:  Tramadol   Note:  This document was prepared using Dragon voice recognition software and may include unintentional dictation errors.  Nanda Quinton, MD Emergency Medicine    Venera Privott, [facility], MD 02/14/17 2004

## 2017-02-14 NOTE — ED Triage Notes (Signed)
Pt presents with 2-3 day h/o L shoulder and scapular pain.  Pt denies any injury.  +shortness of breath

## 2017-03-19 ENCOUNTER — Ambulatory Visit (INDEPENDENT_AMBULATORY_CARE_PROVIDER_SITE_OTHER): Payer: Medicare Other | Admitting: Physician Assistant

## 2017-03-19 ENCOUNTER — Ambulatory Visit (INDEPENDENT_AMBULATORY_CARE_PROVIDER_SITE_OTHER): Payer: Medicare Other | Admitting: *Deleted

## 2017-03-19 ENCOUNTER — Encounter: Payer: Self-pay | Admitting: Physician Assistant

## 2017-03-19 ENCOUNTER — Telehealth: Payer: Self-pay | Admitting: Pharmacist

## 2017-03-19 VITALS — BP 78/46 | HR 99 | Ht 67.0 in | Wt 187.1 lb

## 2017-03-19 DIAGNOSIS — I481 Persistent atrial fibrillation: Secondary | ICD-10-CM

## 2017-03-19 DIAGNOSIS — I255 Ischemic cardiomyopathy: Secondary | ICD-10-CM | POA: Diagnosis not present

## 2017-03-19 DIAGNOSIS — Z95810 Presence of automatic (implantable) cardiac defibrillator: Secondary | ICD-10-CM | POA: Diagnosis not present

## 2017-03-19 DIAGNOSIS — I5022 Chronic systolic (congestive) heart failure: Secondary | ICD-10-CM | POA: Diagnosis not present

## 2017-03-19 DIAGNOSIS — I10 Essential (primary) hypertension: Secondary | ICD-10-CM | POA: Diagnosis not present

## 2017-03-19 DIAGNOSIS — I4819 Other persistent atrial fibrillation: Secondary | ICD-10-CM

## 2017-03-19 DIAGNOSIS — Z720 Tobacco use: Secondary | ICD-10-CM | POA: Diagnosis not present

## 2017-03-19 DIAGNOSIS — I63412 Cerebral infarction due to embolism of left middle cerebral artery: Secondary | ICD-10-CM | POA: Diagnosis not present

## 2017-03-19 LAB — CUP PACEART INCLINIC DEVICE CHECK
Brady Statistic RV Percent Paced: 11 %
HighPow Impedance: 45 Ohm
Implantable Lead Implant Date: 20151113
Implantable Lead Location: 753860
Implantable Lead Model: 295
Implantable Lead Serial Number: 135220
Implantable Pulse Generator Implant Date: 20151113
Lead Channel Impedance Value: 716 Ohm
Lead Channel Pacing Threshold Amplitude: 0.7 V
Lead Channel Pacing Threshold Pulse Width: 0.5 ms
Lead Channel Setting Sensing Sensitivity: 0.5 mV
MDC IDC MSMT LEADCHNL RV SENSING INTR AMPL: 16.3 mV
MDC IDC PG SERIAL: 104417
MDC IDC SESS DTM: 20190129050000
MDC IDC SET LEADCHNL RV PACING AMPLITUDE: 2.5 V
MDC IDC SET LEADCHNL RV PACING PULSEWIDTH: 0.5 ms

## 2017-03-19 NOTE — Progress Notes (Signed)
ICD check in clinic at request of [name] PA. Normal device function. Thresholds and sensing consistent with previous device measurements. Impedance trends stable over time. 80 NSVT episodes (2) EGMs 1 likely ture NSVT x 10 beats, (1) AF w/ RVR~ irr V-V intervals . Histogram distribution appropriate for patient and level of activity. No changes made this session. Device programmed at appropriate safety margins. Device programmed to optimize intrinsic conduction. Estimated longevity 4 years. Pt educated about remote monitoring and informed that [employer] rep would be reaching out and ordering a home monitor for pt. pt voiced understanding

## 2017-03-19 NOTE — Telephone Encounter (Addendum)
Medication list reviewed in anticipation of upcoming Tikosyn initiation. Patient is not taking any contraindicated or QTc prolonging medications.   Patient is anticoagulated on Eliquis 5mg  BID on the appropriate dose. Please ensure that patient has not missed any anticoagulation doses in the 3 weeks prior to Tikosyn initiation. Per office visit note today, it appears as though pt has missed a few doses of his anticoagulation recently.  Patient will need to be counseled to avoid use of Benadryl while on Tikosyn and in the 2-3 days prior to Tikosyn initiation.

## 2017-03-19 NOTE — Progress Notes (Addendum)
Cardiology Office Note    Date:  03/19/2017   ID:  Ad Guttman, DOB 1950-11-07, MRN [id number]  PCP:  Rogue Bussing, MD  Cardiologist: Virl Axe, MD  Chief Complaint  Patient presents with  . Follow-up    History of Present Illness:  Glenn [name] is a 67 y.o. male  with history of atrial fibrillation on Xarelto with ICD, CHF, hepatitis C and hypertension who presented to [facility] with left MCA CVA. He did not receive TPA due to arriving outside the treatment window and being on Xarelto. He underwent chemical thrombectomy for left M2 occlusion. Patient was switched to Eliquis for secondary stroke prevention and aspirin 81 mg was added given his cardiac history. 2-D echo in the hospital LVEF 30-35%.   Patient moved here from [place] in May. He has a 2 year history of Atrial fibrillation and had an ICD-[employer]a 12/2015 put in. Says he had a heart cath that showed no blockages but he had CHF. Smokes 1 ppd 30-40 yrs. Says he had chest tightness and shortness of breath for about a week prior to CVA. Also missed a couple of Xarelto doses.   Patient saw Dr. [name] as a new patient 09/19/16. Device interrogation demonstrated rapid atrial rates with averages over 100 bpm. He also had acute on chronic CHF. Dr. [name] felt the atrial fibrillation was likely contributing to his heart failure. Low-dose diltiazem was added despite the fact that he had CHF, based on the presumption that his atrial fibrillation rate was contributing to his heart failure. He also wanted to attempt cardioversion once. His LA size has discordant measurements. He felt if he could maintain sinus rhythm and potentially dofetilide or amiodarone might be beneficial. Lasix was increased to 40 mg daily.   He underwent successful cardioversion 10/10/16.  In the ED 02/14/17 with left chest and left shoulder pain that was reproducible to touch.  He was discharged on tramadol.  Patient comes in today for  follow-up.  He had stopped his lisinopril for 2 months because it was making him feel dizzy but he started it back 3 days ago taking it at night.  He says this has helped.  Blood pressure was quite low when he arrived at 78/46 but on recheck it was 98/46.  He also says his heart is skipping and racing especially at night when he lays down.  His breathing is stable.  Complaining of chronic aching pain in his shoulder and neck.  He sees primary care for this tomorrow.  He said his tramadol made him feel like a zombie and he cannot tolerate it. Dr. [name] did discuss possible dofetilide or amiodarone.    Past Medical History:  Diagnosis Date  . Atrial fibrillation ([place])   . CHF (congestive heart failure) ([place])   . Hepatitis C   . Hypertension   . Stroke [facility])     Past Surgical History:  Procedure Laterality Date  . CARDIAC DEFIBRILLATOR PLACEMENT  2015  . CARDIOVERSION N/A 10/10/2016   Procedure: CARDIOVERSION;  Surgeon: Dorothy Spark, MD;  Location: [place];  Service: Cardiovascular;  Laterality: N/A;  . EYE SURGERY Left 1990  . IR PERCUTANEOUS ART THROMBECTOMY/INFUSION INTRACRANIAL INC DIAG ANGIO  09/05/2016  . IR RADIOLOGIST EVAL & MGMT  10/03/2016  . RADIOLOGY WITH ANESTHESIA N/A 09/05/2016   Procedure: RADIOLOGY WITH ANESTHESIA;  Surgeon: Luanne Bras, MD;  Location: [place];  Service: Radiology;  Laterality: N/A;    Current Medications: Current Meds  Medication  Sig  . apixaban (ELIQUIS) 5 MG TABS tablet Take 1 tablet (5 mg total) by mouth 2 (two) times daily.  . busPIRone (BUSPAR) 7.5 MG tablet Take 7.5 mg by mouth daily as needed for depression.  . carvedilol (COREG) 25 MG tablet Take 1 tablet (25 mg total) by mouth 2 (two) times daily with a meal.  . Cholecalciferol (VITAMIN D3) 1000 units CAPS Take 1,000 Units by mouth daily.  . diclofenac sodium (VOLTAREN) 1 % GEL Apply to affected area as needed twice daily.  . furosemide (LASIX) 40 MG tablet Take 1 tablet (40 mg  total) by mouth daily.  . hydrALAZINE (APRESOLINE) 25 MG tablet Take 1 tablet (25 mg total) by mouth 2 (two) times daily.  . isosorbide mononitrate (ISMO,MONOKET) 10 MG tablet Take 1 tablet (10 mg total) by mouth daily.  Marland Kitchen lisinopril (PRINIVIL,ZESTRIL) 5 MG tablet Take 5 mg by mouth daily.  . potassium chloride SA (K-DUR,KLOR-CON) 20 MEQ tablet Take 1 tablet (20 mEq total) by mouth daily.  . traMADol (ULTRAM) 50 MG tablet Take 1 tablet (50 mg total) by mouth every 6 (six) hours as needed.     Allergies:   Patient has no known allergies.   Social History   Socioeconomic History  . Marital status: Single    Spouse name: None  . Number of children: None  . Years of education: 80 (some college)  . Highest education level: None  Social Needs  . Financial resource strain: None  . Food insecurity - worry: None  . Food insecurity - inability: None  . Transportation needs - medical: None  . Transportation needs - non-medical: None  Occupational History  . Occupation: disability  Tobacco Use  . Smoking status: Current Some Day Smoker    Packs/day: 0.50    Types: Cigarettes  . Smokeless tobacco: Never Used  . Tobacco comment: a pack will last a week  Substance and Sexual Activity  . Alcohol use: Yes    Alcohol/week: 1.8 oz    Types: 3 Cans of beer per week    Comment: weekends, a couple   . Drug use: Yes    Frequency: 2.0 times per week    Types: Marijuana    Comment: for pain and leisure   . Sexual activity: Yes    Partners: Female    Birth control/protection: Condom  Other Topics Concern  . None  Social History Narrative  . None     Family History:  The patient's family history includes Stroke in his maternal aunt.   ROS:   Please see the history of present illness.    Review of Systems  Constitution: Negative.  HENT: Negative.   Cardiovascular: Positive for irregular heartbeat.  Respiratory: Negative.   Endocrine: Negative.   Hematologic/Lymphatic: Negative.     Musculoskeletal: Negative.   Gastrointestinal: Negative.   Genitourinary: Negative.   Neurological: Positive for dizziness.   All other systems reviewed and are negative.   PHYSICAL EXAM:   VS:  BP (!) 78/46   Pulse 99   Ht 5\' 7"  (1.702 m)   Wt 187 lb 1.9 oz (84.9 kg)   SpO2 97%   BMI 29.31 kg/m   Physical Exam  GEN: Well nourished, well developed, in no acute distress  Neck: no JVD, carotid bruits, or masses Cardiac: Irregular irregular 2/6 systolic murmur Respiratory:  clear to auscultation bilaterally, normal work of breathing GI: soft, nontender, nondistended, + BS Ext: without cyanosis, clubbing, or edema, Good distal pulses bilaterally  Neuro:  Alert and Oriented x 3,  Psych: euthymic mood, full affect  Wt Readings from Last 3 Encounters:  03/19/17 187 lb 1.9 oz (84.9 kg)  02/14/17 182 lb (82.6 kg)  11/13/16 188 lb 3.2 oz (85.4 kg)      Studies/Labs Reviewed:   EKG:  EKG is ordered today.  The ekg ordered today demonstrates atrial fibrillation 66 bpm  Recent Labs: 09/05/2016: ALT 33 09/07/2016: Magnesium 1.7 02/14/2017: BUN 14; Creatinine, Ser 1.35; Hemoglobin 13.5; Platelets 163; Potassium 4.4; Sodium 141   Lipid Panel    Component Value Date/Time   CHOL 96 09/06/2016 0500   TRIG 128 09/06/2016 0500   HDL 26 (L) 09/06/2016 0500   CHOLHDL 3.7 09/06/2016 0500   VLDL 26 09/06/2016 0500   LDLCALC 44 09/06/2016 0500    Additional studies/ records that were reviewed today include:  TTE - Left ventricle: The cavity size was normal. Systolic function was   moderately to severely reduced. The estimated ejection fraction   was in the range of 30% to 35%. Diffuse hypokinesis. Moderate   hypokinesis of the anteroseptal, inferior, and inferoseptal   myocardium. - Aortic valve: Transvalvular velocity was within the normal range.   There was no stenosis. There was moderate regurgitation. Valve   area (VTI): 2.25 cm^2. Valve area (Vmax): 2.37 cm^2. Valve area    (Vmean): 2.34 cm^2. - Mitral valve: Transvalvular velocity was within the normal range.   There was no evidence for stenosis. There was mild regurgitation. - Left atrium: The atrium was moderately dilated. - Right ventricle: The cavity size was normal. Wall thickness was   normal. Systolic function was mildly reduced. - Right atrium: The atrium was moderately dilated. - Tricuspid valve: There was mild-moderate regurgitation. - Pulmonary arteries: Systolic pressure was mildly increased. PA   peak pressure: 38 mm Hg (S).             ASSESSMENT:    1. Persistent atrial fibrillation ([place])   2. Chronic systolic congestive heart failure ([place])   3. Essential hypertension   4. ICD (implantable cardioverter-defibrillator) in place   5. Cerebrovascular accident (CVA) due to embolism of left middle cerebral artery ([place])   6. Tobacco abuse      PLAN:  In order of problems listed above:  Persistent atrial fibrillation patient is not as back in atrial fibrillation after cardioversion. device check today some NSVT and rapid atrial at 205 bpm..  Dr. [name] discussed dofetilide or amiodarone as possibly being beneficial to him.  Patient is on Eliquis.  Will discuss with Dr. [name] this afternoon for further recommendations.  Addendum discussed with Dr. [name] who recommends referral to A. fib clinic for Tikosyn.  Have made referral.  Chronic systolic CHF currently compensated.  Ejection fraction 30-35%  Hypertension patient is hypotensive today.  He recently started his lisinopril 5 mg back 3 days ago after being off of it for 2 months because of dizziness.  Will stop lisinopril. Orthostatic VS for the past 24 hrs (Last 3 readings):  BP- Lying Pulse- Lying BP- Sitting Pulse- Sitting BP- Standing at 0 minutes Pulse- Standing at 0 minutes BP- Standing at 3 minutes Pulse- Standing at 3 minutes  03/19/17 1001 105/62 (!) 49 95/47 75 93/50 68 91/59 78  03/19/17 0958 105/62 (!) 49 95/47 75 93/50 68  91/59 78    ICD implant 2 years ago [employer] a followed by Dr. [name]  Left MCA CVA felt secondary to atrial fibrillation status post mechanical  thrombectomy with complete recanalization with excellent recovery.  Patient had missed Xarelto doses prior to his stroke.  Tobacco abuse patient still smoking but not daily.  Smoking cessation advised.   Medication Adjustments/Labs and Tests Ordered: Current medicines are reviewed at length with the patient today.  Concerns regarding medicines are outlined above.  Medication changes, Labs and Tests ordered today are listed in the Patient Instructions below. Patient Instructions  Medication Instructions:  Your physician has recommended you make the following change in your medication:  HOLD THE LISINOPRIL UNTIL FURTHER NOTICE  Labwork: None ordered  Testing/Procedures: None ordered  Follow-Up: Your physician recommends that you schedule a follow-up appointment in: [place]    Any Other Special Instructions Will Be Listed Below (If Applicable).     If you need a refill on your cardiac medications before your next appointment, please call your pharmacy.      Signed, [name], PA-C  03/19/2017 3:16 PM    [facility] [address], [place], [place]  [address] Phone: [phone]; Fax: [phone]

## 2017-03-19 NOTE — Patient Instructions (Signed)
Medication Instructions:  Your physician has recommended you make the following change in your medication:  HOLD THE LISINOPRIL UNTIL FURTHER NOTICE  Labwork: None ordered  Testing/Procedures: None ordered  Follow-Up: Your physician recommends that you schedule a follow-up appointment in: [place]    Any Other Special Instructions Will Be Listed Below (If Applicable).     If you need a refill on your cardiac medications before your next appointment, please call your pharmacy.

## 2017-03-20 ENCOUNTER — Ambulatory Visit: Payer: Medicaid Other | Admitting: Internal Medicine

## 2017-03-22 ENCOUNTER — Encounter: Payer: Self-pay | Admitting: Internal Medicine

## 2017-03-25 ENCOUNTER — Inpatient Hospital Stay (HOSPITAL_COMMUNITY)
Admission: RE | Admit: 2017-03-25 | Discharge: 2017-03-28 | DRG: 309 | Disposition: A | Discharge status: Home / Self Care | Payer: Medicare Other | Source: Ambulatory Visit | Attending: Internal Medicine | Admitting: Internal Medicine

## 2017-03-25 ENCOUNTER — Encounter (HOSPITAL_COMMUNITY): Payer: Self-pay | Admitting: Nurse Practitioner

## 2017-03-25 ENCOUNTER — Ambulatory Visit (HOSPITAL_COMMUNITY)
Admission: RE | Admit: 2017-03-25 | Discharge: 2017-03-25 | Disposition: A | Discharge status: Home / Self Care | Payer: Medicare Other | Source: Ambulatory Visit | Attending: Nurse Practitioner | Admitting: Nurse Practitioner

## 2017-03-25 ENCOUNTER — Encounter (HOSPITAL_COMMUNITY): Payer: Self-pay | Admitting: *Deleted

## 2017-03-25 VITALS — BP 102/52 | HR 83 | Ht 67.0 in | Wt 191.0 lb

## 2017-03-25 DIAGNOSIS — I5042 Chronic combined systolic (congestive) and diastolic (congestive) heart failure: Secondary | ICD-10-CM | POA: Diagnosis not present

## 2017-03-25 DIAGNOSIS — B182 Chronic viral hepatitis C: Secondary | ICD-10-CM | POA: Diagnosis present

## 2017-03-25 DIAGNOSIS — I110 Hypertensive heart disease with heart failure: Secondary | ICD-10-CM | POA: Diagnosis present

## 2017-03-25 DIAGNOSIS — Z79899 Other long term (current) drug therapy: Secondary | ICD-10-CM

## 2017-03-25 DIAGNOSIS — M25512 Pain in left shoulder: Secondary | ICD-10-CM | POA: Diagnosis present

## 2017-03-25 DIAGNOSIS — I481 Persistent atrial fibrillation: Secondary | ICD-10-CM | POA: Diagnosis not present

## 2017-03-25 DIAGNOSIS — I480 Paroxysmal atrial fibrillation: Secondary | ICD-10-CM

## 2017-03-25 DIAGNOSIS — Z23 Encounter for immunization: Secondary | ICD-10-CM | POA: Diagnosis not present

## 2017-03-25 DIAGNOSIS — F17210 Nicotine dependence, cigarettes, uncomplicated: Secondary | ICD-10-CM | POA: Diagnosis present

## 2017-03-25 DIAGNOSIS — I248 Other forms of acute ischemic heart disease: Secondary | ICD-10-CM | POA: Diagnosis present

## 2017-03-25 DIAGNOSIS — Z8673 Personal history of transient ischemic attack (TIA), and cerebral infarction without residual deficits: Secondary | ICD-10-CM | POA: Diagnosis not present

## 2017-03-25 DIAGNOSIS — E876 Hypokalemia: Secondary | ICD-10-CM | POA: Diagnosis not present

## 2017-03-25 DIAGNOSIS — I428 Other cardiomyopathies: Secondary | ICD-10-CM

## 2017-03-25 DIAGNOSIS — Z5181 Encounter for therapeutic drug level monitoring: Secondary | ICD-10-CM | POA: Diagnosis not present

## 2017-03-25 DIAGNOSIS — Z7901 Long term (current) use of anticoagulants: Secondary | ICD-10-CM | POA: Diagnosis not present

## 2017-03-25 DIAGNOSIS — I5022 Chronic systolic (congestive) heart failure: Secondary | ICD-10-CM | POA: Diagnosis not present

## 2017-03-25 DIAGNOSIS — I472 Ventricular tachycardia: Secondary | ICD-10-CM | POA: Diagnosis not present

## 2017-03-25 DIAGNOSIS — I509 Heart failure, unspecified: Secondary | ICD-10-CM | POA: Diagnosis not present

## 2017-03-25 DIAGNOSIS — G8929 Other chronic pain: Secondary | ICD-10-CM | POA: Diagnosis present

## 2017-03-25 DIAGNOSIS — I4819 Other persistent atrial fibrillation: Secondary | ICD-10-CM

## 2017-03-25 DIAGNOSIS — Z7189 Other specified counseling: Secondary | ICD-10-CM

## 2017-03-25 DIAGNOSIS — Z95810 Presence of automatic (implantable) cardiac defibrillator: Secondary | ICD-10-CM

## 2017-03-25 HISTORY — DX: Other long term (current) drug therapy: Z79.899

## 2017-03-25 HISTORY — DX: Encounter for therapeutic drug level monitoring: Z51.81

## 2017-03-25 LAB — BASIC METABOLIC PANEL
ANION GAP: 10 (ref 5–15)
BUN: 19 mg/dL (ref 6–20)
CHLORIDE: 104 mmol/L (ref 101–111)
CO2: 25 mmol/L (ref 22–32)
CREATININE: 1.31 mg/dL — AB (ref 0.61–1.24)
Calcium: 9.4 mg/dL (ref 8.9–10.3)
GFR calc non Af Amer: 56 mL/min — ABNORMAL LOW (ref >60)
Glucose, Bld: 83 mg/dL (ref 65–99)
Potassium: 4.1 mmol/L (ref 3.5–5.1)
SODIUM: 139 mmol/L (ref 135–145)

## 2017-03-25 LAB — MAGNESIUM
MAGNESIUM: 1.7 mg/dL (ref 1.7–2.4)
MAGNESIUM: 1.7 mg/dL (ref 1.7–2.4)
MAGNESIUM: 2.3 mg/dL (ref 1.7–2.4)

## 2017-03-25 MED ORDER — SODIUM CHLORIDE 0.9% FLUSH: 3.0000 mL | INTRAVENOUS | Status: DC | PRN

## 2017-03-25 MED ORDER — APIXABAN 5 MG PO TABS
5.0000 mg | ORAL_TABLET | Freq: Two times a day (BID) | ORAL | Status: DC
  Administered 2017-03-25 – 2017-03-28 (×6): 5 mg via ORAL
  Filled 2017-03-25 (×7): qty 1

## 2017-03-25 MED ORDER — POTASSIUM CHLORIDE CRYS ER 20 MEQ PO TBCR
20.0000 meq | EXTENDED_RELEASE_TABLET | Freq: Every day | ORAL | Status: DC
  Administered 2017-03-25 – 2017-03-28 (×4): 20 meq via ORAL
  Filled 2017-03-25 (×4): qty 1

## 2017-03-25 MED ORDER — MAGNESIUM SULFATE 2 GM/50ML IV SOLN
2.0000 g | INTRAVENOUS | Status: AC
  Administered 2017-03-25: 2 g via INTRAVENOUS
  Filled 2017-03-25: qty 50

## 2017-03-25 MED ORDER — DILTIAZEM HCL ER COATED BEADS 120 MG PO CP24
120.0000 mg | ORAL_CAPSULE | Freq: Every day | ORAL | Status: DC
  Administered 2017-03-26 – 2017-03-28 (×3): 120 mg via ORAL
  Filled 2017-03-25 (×4): qty 1

## 2017-03-25 MED ORDER — CARVEDILOL 25 MG PO TABS
25.0000 mg | ORAL_TABLET | Freq: Two times a day (BID) | ORAL | Status: DC
  Administered 2017-03-25 – 2017-03-28 (×6): 25 mg via ORAL
  Filled 2017-03-25 (×7): qty 1

## 2017-03-25 MED ORDER — MAGNESIUM OXIDE 400 (241.3 MG) MG PO TABS
400.0000 mg | ORAL_TABLET | Freq: Once | ORAL | Status: AC
Start: 2017-03-25 — End: 2017-03-25
  Administered 2017-03-25: 400 mg via ORAL
  Filled 2017-03-25: qty 1

## 2017-03-25 MED ORDER — SODIUM CHLORIDE 0.9% FLUSH
3.0000 mL | Freq: Two times a day (BID) | INTRAVENOUS | Status: DC
  Administered 2017-03-25 – 2017-03-27 (×3): 3 mL via INTRAVENOUS

## 2017-03-25 MED ORDER — POTASSIUM CHLORIDE CRYS ER 20 MEQ PO TBCR: 20.0000 meq | EXTENDED_RELEASE_TABLET | Freq: Every day | ORAL | Status: DC

## 2017-03-25 MED ORDER — DOFETILIDE 500 MCG PO CAPS
500.0000 ug | ORAL_CAPSULE | Freq: Two times a day (BID) | ORAL | Status: DC
  Administered 2017-03-25: 500 ug via ORAL
  Filled 2017-03-25: qty 1

## 2017-03-25 MED ORDER — LISINOPRIL 5 MG PO TABS
5.0000 mg | ORAL_TABLET | Freq: Every day | ORAL | Status: DC
  Administered 2017-03-26 – 2017-03-28 (×3): 5 mg via ORAL
  Filled 2017-03-25 (×3): qty 1

## 2017-03-25 MED ORDER — MAGNESIUM OXIDE 400 (241.3 MG) MG PO TABS: 400.0000 mg | ORAL_TABLET | Freq: Once | ORAL | Status: DC

## 2017-03-25 MED ORDER — APIXABAN 5 MG PO TABS: 5.0000 mg | ORAL_TABLET | Freq: Two times a day (BID) | ORAL | Status: DC

## 2017-03-25 MED ORDER — SODIUM CHLORIDE 0.9 % IV SOLN: 250.0000 mL | INTRAVENOUS | Status: DC | PRN

## 2017-03-25 MED ORDER — IBUPROFEN 200 MG PO TABS
400.0000 mg | ORAL_TABLET | Freq: Once | ORAL | Status: AC
  Administered 2017-03-25: 400 mg via ORAL
  Filled 2017-03-25: qty 2

## 2017-03-25 MED ORDER — HYDRALAZINE HCL 25 MG PO TABS
25.0000 mg | ORAL_TABLET | Freq: Two times a day (BID) | ORAL | Status: DC
  Administered 2017-03-25 – 2017-03-28 (×6): 25 mg via ORAL
  Filled 2017-03-25 (×6): qty 1

## 2017-03-25 MED ORDER — FUROSEMIDE 40 MG PO TABS
40.0000 mg | ORAL_TABLET | Freq: Every day | ORAL | Status: DC
  Administered 2017-03-26 – 2017-03-28 (×3): 40 mg via ORAL
  Filled 2017-03-25 (×3): qty 1

## 2017-03-25 NOTE — Progress Notes (Signed)
Primary Care Physician: Rogue Bussing, MD Referring Physician: Dr. Demetrius Charity [name] is a 67 y.o. male with a h/o afib on Xarelto with ICD, CHF, hepatitis C and hypertension who presented to [facility] 09/05/2016, with left MCA CVA.He did not receive TPA due to arriving outside the treatment window and being on Xarelto. He underwent chemical thrombectomy for left M2 occlusion. Patient was switched to Eliquis for secondary stroke prevention and aspirin 81 mg was added given his cardiac history. 2-D echo in the hospital LVEF 30-35%.   Patient moved here from [place] in May. He has a 2 year history of Atrial fibrillation and had an ICD-[employer] 12/2015 put in. Says he had a heart cath that showed no blockages but he had CHF. Smokes 1 ppd 30-40 yrs. Says he had chest tightness and shortness of breath for about a week prior to CVA. Also missed a couple of Xarelto doses.   Patient saw Dr. [name] as a new patient 09/19/16. Device interrogation demonstrated rapid atrial rates with averages over 100 bpm. He also had acute on chronic CHF. Dr. [name] felt the atrial fibrillation was likely contributing to his heart failure. Low-dose diltiazem was added despite the fact that he had CHF,based on the presumption that his atrial fibrillation rate was contributing to his heart failure. He also wanted to attempt cardioversion once. His LA size has discordant measurements. He felt if he could maintain sinus rhythm and potentially dofetilide or amiodarone might be beneficial. Lasix was increased to 40 mg daily.   He underwent successful cardioversion 10/10/16.  In the ED 02/14/17 with left chest and left shoulder pain that was reproducible to touch.  He was discharged on tramadol.   Patient was seen by [name] 03/19/17.  He had stopped his lisinopril for 2 months because it was making him feel dizzy but he started it back 3 days ago taking it at night.  He says this has helped.  Blood  pressure was quite low when he arrived at 78/46 but on recheck it was 98/46.  He also says his heart is skipping and racing especially at night when he lays down.  His breathing is stable.  Complaining of chronic aching pain in his shoulder and neck.   He said his tramadol made him feel like a zombie and he cannot tolerate it. Dr. [name] consulted re pt and  did discuss possible dofetilide or amiodarone.  Pt is now in the afib clinic, 2/4, for discussion of admission for Tikosyn. Pt has medicare/medicaid and cost should not be a problem. Specifics re Tikosyn discussed with pt, especially that he has to take specifically as written and can not miss doses, f/u is very important as scheduled with labs/ekg's. He would like to proceed. PharmD has reviewed drugs and no contraindicated or QTc prolonging medications. Denies any benadryl use, no missed doses of eliquis.    Today, he denies symptoms of palpitations, chest pain, orthopnea, PND, lower extremity edema, dizziness, presyncope, syncope, or neurologic sequela. + for fatigue and shortness of breath with afib. The patient is tolerating medications without difficulties and is otherwise without complaint today.   Past Medical History:  Diagnosis Date  . Atrial fibrillation ([place])   . CHF (congestive heart failure) ([place])   . Hepatitis C   . Hypertension   . Stroke [facility])    Past Surgical History:  Procedure Laterality Date  . CARDIAC DEFIBRILLATOR PLACEMENT  2015  . CARDIOVERSION N/A 10/10/2016   Procedure:  CARDIOVERSION;  Surgeon: Dorothy Spark, MD;  Location: [place];  Service: Cardiovascular;  Laterality: N/A;  . EYE SURGERY Left 1990  . IR PERCUTANEOUS ART THROMBECTOMY/INFUSION INTRACRANIAL INC DIAG ANGIO  09/05/2016  . IR RADIOLOGIST EVAL & MGMT  10/03/2016  . RADIOLOGY WITH ANESTHESIA N/A 09/05/2016   Procedure: RADIOLOGY WITH ANESTHESIA;  Surgeon: Luanne Bras, MD;  Location: [place];  Service: Radiology;  Laterality: N/A;     Current Outpatient Medications  Medication Sig Dispense Refill  . apixaban (ELIQUIS) 5 MG TABS tablet Take 1 tablet (5 mg total) by mouth 2 (two) times daily. 60 tablet 6  . busPIRone (BUSPAR) 7.5 MG tablet Take 7.5 mg by mouth daily as needed for depression.    . carvedilol (COREG) 25 MG tablet Take 1 tablet (25 mg total) by mouth 2 (two) times daily with a meal. 60 tablet 6  . diclofenac sodium (VOLTAREN) 1 % GEL Apply to affected area as needed twice daily. 100 g 1  . diltiazem (CARDIZEM CD) 120 MG 24 hr capsule Take 1 capsule (120 mg total) by mouth daily. 30 capsule 6  . furosemide (LASIX) 40 MG tablet Take 1 tablet (40 mg total) by mouth daily. 30 tablet 6  . hydrALAZINE (APRESOLINE) 25 MG tablet Take 1 tablet (25 mg total) by mouth 2 (two) times daily. 60 tablet 11  . potassium chloride SA (K-DUR,KLOR-CON) 20 MEQ tablet Take 1 tablet (20 mEq total) by mouth daily. 90 tablet 3  . lisinopril (PRINIVIL,ZESTRIL) 5 MG tablet Take 5 mg by mouth daily.     No current facility-administered medications for this encounter.     No Known Allergies  Social History   Socioeconomic History  . Marital status: Single    Spouse name: Not on file  . Number of children: Not on file  . Years of education: 28 (some college)  . Highest education level: Not on file  Social Needs  . Financial resource strain: Not on file  . Food insecurity - worry: Not on file  . Food insecurity - inability: Not on file  . Transportation needs - medical: Not on file  . Transportation needs - non-medical: Not on file  Occupational History  . Occupation: disability  Tobacco Use  . Smoking status: Current Some Day Smoker    Packs/day: 0.50    Types: Cigarettes  . Smokeless tobacco: Never Used  . Tobacco comment: a pack will last a week  Substance and Sexual Activity  . Alcohol use: Yes    Alcohol/week: 1.8 oz    Types: 3 Cans of beer per week    Comment: weekends, a couple   . Drug use: Yes     Frequency: 2.0 times per week    Types: Marijuana    Comment: for pain and leisure   . Sexual activity: Yes    Partners: Female    Birth control/protection: Condom  Other Topics Concern  . Not on file  Social History Narrative  . Not on file    Family History  Problem Relation Age of Onset  . Stroke Maternal Aunt   . Heart disease Neg Hx     ROS- All systems are reviewed and negative except as per the HPI above  Physical Exam: Vitals:   03/25/17 1002  BP: (!) 102/52  Pulse: 83  Weight: 191 lb (86.6 kg)  Height: 5\' 7"  (1.702 m)   Wt Readings from Last 3 Encounters:  03/25/17 191 lb (86.6 kg)  03/19/17 187  lb 1.9 oz (84.9 kg)  02/14/17 182 lb (82.6 kg)    Labs: Lab Results  Component Value Date   NA 141 02/14/2017   K 4.4 02/14/2017   CL 105 02/14/2017   CO2 27 02/14/2017   GLUCOSE 88 02/14/2017   BUN 14 02/14/2017   CREATININE 1.35 (H) 02/14/2017   CALCIUM 9.2 02/14/2017   PHOS 2.7 09/07/2016   MG 1.7 09/07/2016   Lab Results  Component Value Date   INR 1.30 09/05/2016   Lab Results  Component Value Date   CHOL 96 09/06/2016   HDL 26 (L) 09/06/2016   LDLCALC 44 09/06/2016   TRIG 128 09/06/2016     GEN- The patient is well appearing, alert and oriented x 3 today.   Head- normocephalic, atraumatic Eyes-  Sclera clear, conjunctiva pink Ears- hearing intact Oropharynx- clear Neck- supple, no JVP Lymph- no cervical lymphadenopathy Lungs- Clear to ausculation bilaterally, normal work of breathing Heart- irregular rate and rhythm, no murmurs, rubs or gallops, PMI not laterally displaced GI- soft, NT, ND, + BS Extremities- no clubbing, cyanosis, or edema MS- no significant deformity or atrophy Skin- no rash or lesion Psych- euthymic mood, full affect Neuro- strength and sensation are intact  EKG- afib at 83 bpm, qrs int 92 ms, qtc 455 ms Epic records reviewed Echo-Study Conclusions  - Left ventricle: The cavity size was normal. Systolic  function was   moderately to severely reduced. The estimated ejection fraction   was in the range of 30% to 35%. Diffuse hypokinesis. Moderate   hypokinesis of the anteroseptal, inferior, and inferoseptal   myocardium. - Aortic valve: Transvalvular velocity was within the normal range.   There was no stenosis. There was moderate regurgitation. Valve   area (VTI): 2.25 cm^2. Valve area (Vmax): 2.37 cm^2. Valve area   (Vmean): 2.34 cm^2. - Mitral valve: Transvalvular velocity was within the normal range.   There was no evidence for stenosis. There was mild regurgitation. - Left atrium: The atrium was moderately dilated. - Right ventricle: The cavity size was normal. Wall thickness was   normal. Systolic function was mildly reduced. - Right atrium: The atrium was moderately dilated. - Tricuspid valve: There was mild-moderate regurgitation. - Pulmonary arteries: Systolic pressure was mildly increased. PA   peak pressure: 38 mm Hg (S).   Assessment and Plan: 1. Persistent afib  Tikosyn precautions discussed Cost should not be an issue Pt is to be admitted for tikosyn with Dr. [name] today Qtc today at 465 ms, but recently 419 ms on 1/29 No missed does of Eliquis No qtc prolonging drugs on board No benadryl use Crcl cal at 68.95 K+ ok by today's labs, mag at 1.7, will give 400 mg po until admitted later today, will need recheck on admission   [place]. [place], [facility] [address] [place], [place] [address] [phone]

## 2017-03-25 NOTE — Progress Notes (Signed)
Pharmacy Review for Dofetilide (Tikosyn) Initiation  Admit Complaint: 67 y.o. male admitted 03/25/2017 with atrial fibrillation to be initiated on dofetilide.   Assessment:  Patient Exclusion Criteria: If any screening criteria checked as "Yes", then  patient  should NOT receive dofetilide until criteria item is corrected. If "Yes" please indicate correction plan.  YES  NO Patient  Exclusion Criteria Correction Plan  []  [x]  Baseline QTc interval is greater than or equal to 440 msec. IF above YES box checked dofetilide contraindicated unless patient has ICD; then may proceed if QTc 500-550 msec or with known ventricular conduction abnormalities may proceed with QTc 550-600 msec. QTc =  465 (ICD)   [x]  []  Magnesium level is less than 1.8 mEq/l : Last magnesium:  Lab Results  Component Value Date   MG 1.7 03/25/2017       Gave magox 400mg  PO x 1, f/u repeat  []  [x]  Potassium level is less than 4 mEq/l : Last potassium:  Lab Results  Component Value Date   K 4.1 03/25/2017         []  [x]  Patient is known or suspected to have a digoxin level greater than 2 ng/ml: No results found for: DIGOXIN    []  [x]  Creatinine clearance less than 20 ml/min (calculated using Cockcroft-Gault, actual body weight and serum creatinine): Estimated Creatinine Clearance: 59.1 mL/min (A) (by C-G formula based on SCr of 1.31 mg/dL (H)).    []  [x]  Patient has received drugs known to prolong the QT intervals within the last 48 hours (phenothiazines, tricyclics or tetracyclic antidepressants, erythromycin, H-1 antihistamines, cisapride, fluoroquinolones, azithromycin). Drugs not listed above may have an, as yet, undetected potential to prolong the QT interval, updated information on QT prolonging agents is available at this website:QT prolonging agents   []  [x]  Patient received a dose of hydrochlorothiazide (Oretic) alone or in any combination including triamterene (Dyazide, Maxzide) in the last 48 hours.   []  [x]   Patient received a medication known to increase dofetilide plasma concentrations prior to initial dofetilide dose:  . Trimethoprim (Primsol, Proloprim) in the last 36 hours . Verapamil (Calan, Verelan) in the last 36 hours or a sustained release dose in the last 72 hours . Megestrol (Megace) in the last 5 days  . Cimetidine (Tagamet) in the last 6 hours . Ketoconazole (Nizoral) in the last 24 hours . Itraconazole (Sporanox) in the last 48 hours  . Prochlorperazine (Compazine) in the last 36 hours    []  [x]  Patient is known to have a history of torsades de pointes; congenital or acquired long QT syndromes.   []  [x]  Patient has received a Class 1 antiarrhythmic with less than 2 half-lives since last dose. (Disopyramide, Quinidine, Procainamide, Lidocaine, Mexiletine, Flecainide, Propafenone)   []  [x]  Patient has received amiodarone therapy in the past 3 months or amiodarone level is greater than 0.3 ng/ml.    Patient has been appropriately anticoagulated with apixaban.  Ordering provider was confirmed at [email or web address] if they are not listed on the [place] Prescribers list.  Goal of Therapy: Follow renal function, electrolytes, potential drug interactions, and dose adjustment. Provide education and 1 week supply at discharge.  Plan:  [x]   Physician selected initial dose within range recommended for patients level of renal function - will monitor for response.  []   Physician selected initial dose outside of range recommended for patients level of renal function - will discuss if the dose should be altered at this time.   Select One Calculated CrCl  Dose q12h  [x]  > 60 ml/min 500 mcg  []  40-60 ml/min 250 mcg  []  20-40 ml/min 125 mcg   2. Follow up QTc after the first 5 doses, renal function, electrolytes (K & Mg) daily x 3     days, dose adjustment, success of initiation and facilitate 1 week discharge supply as     clinically indicated.  3. Initiate Tikosyn education video  (Call [phone] and ask for Tikosyn Video # [phone]).  4. Place Enrollment Form on the chart for discharge supply of dofetilide.   Elicia Lamp, PharmD, BCPS Clinical Pharmacist 03/25/2017 3:50 PM

## 2017-03-25 NOTE — H&P (Signed)
Cardiology Admission History and Physical:   Patient ID: Glenn [name]; MRN: [id number]; DOB: [birth year]   Admission date: 03/25/2017  Primary Care Provider: Rogue Bussing, MD Primary Cardiologist: Virl Axe, MD   Admission: Tikosyn initiation    Patient Profile:   Glenn [name] is a 67 y.o. male with a history of persistent AFib, HTN, CVA (July 2018, some notes state in the environment of missed xarelto doses?, was switched to Eliquis), Hep C, chronic neck/shoulder pain, hx of NICM w/ICD, hx of CHF.  History of Present Illness:   Glenn [name] if followed outpatient by Dr.Klein as a new patient, recently moving from [place] to [facility] in May 2018.  Noted with persistent AFib (klnown to him) though thought to be contributing to CHF, started on CCB for rate controled and underwent DCCV in August, had return of AFib, he saw Gerrianne Scale, PA on 03/19/16 and in d/w Dr. [name], referred to AFib clinic to prepare for [place] admission.  The patient's med list was reviewed by [facility] 03/19/16, no contraindicated medicines.  The patient confirms to me, no missed doses of his Eliquis in the last 3 weeks (longer).  He has chronic L shoulder pain, worsened with movement of his arm. Otherwise no complaints today.  Device information: BSci single chamber ICD, implanted 01/01/14.  Past Medical History:  Diagnosis Date  . Atrial fibrillation ([place])   . CHF (congestive heart failure) ([place])   . Hepatitis C   . Hypertension   . Stroke [facility])     Past Surgical History:  Procedure Laterality Date  . CARDIAC DEFIBRILLATOR PLACEMENT  2015  . CARDIOVERSION N/A 10/10/2016   Procedure: CARDIOVERSION;  Surgeon: Dorothy Spark, MD;  Location: [place];  Service: Cardiovascular;  Laterality: N/A;  . EYE SURGERY Left 1990  . IR PERCUTANEOUS ART THROMBECTOMY/INFUSION INTRACRANIAL INC DIAG ANGIO  09/05/2016  . IR RADIOLOGIST EVAL & MGMT  10/03/2016  . RADIOLOGY WITH ANESTHESIA N/A 09/05/2016   Procedure: RADIOLOGY WITH ANESTHESIA;  Surgeon: Luanne Bras, MD;  Location: [place];  Service: Radiology;  Laterality: N/A;     Medications Prior to Admission: Prior to Admission medications   Medication Sig Start Date End Date Taking? Authorizing Provider  apixaban (ELIQUIS) 5 MG TABS tablet Take 1 tablet (5 mg total) by mouth 2 (two) times daily. 10/08/16   Imogene Burn, PA-C  busPIRone (BUSPAR) 7.5 MG tablet Take 7.5 mg by mouth daily as needed for depression. 07/09/16   [provider]  carvedilol (COREG) 25 MG tablet Take 1 tablet (25 mg total) by mouth 2 (two) times daily with a meal. 09/08/16   Rinehuls, Early Chars, PA-C  diclofenac sodium (VOLTAREN) 1 % GEL Apply to affected area as needed twice daily. 09/11/16   Rogue Bussing, MD  diltiazem (CARDIZEM CD) 120 MG 24 hr capsule Take 1 capsule (120 mg total) by mouth daily. 10/08/16 03/25/17  Imogene Burn, PA-C  furosemide (LASIX) 40 MG tablet Take 1 tablet (40 mg total) by mouth daily. 10/08/16   Imogene Burn, PA-C  hydrALAZINE (APRESOLINE) 25 MG tablet Take 1 tablet (25 mg total) by mouth 2 (two) times daily. 10/08/16   Imogene Burn, PA-C  lisinopril (PRINIVIL,ZESTRIL) 5 MG tablet Take 5 mg by mouth daily.    [provider]  potassium chloride SA (K-DUR,KLOR-CON) 20 MEQ tablet Take 1 tablet (20 mEq total) by mouth daily. 10/09/16   Imogene Burn, PA-C     Allergies:   No Known Allergies  Social History:   Social History   Socioeconomic History  . Marital status: Single    Spouse name: Not on file  . Number of children: Not on file  . Years of education: 80 (some college)  . Highest education level: Not on file  Social Needs  . Financial resource strain: Not on file  . Food insecurity - worry: Not on file  . Food insecurity - inability: Not on file  . Transportation needs - medical: Not on file  . Transportation needs - non-medical: Not on file  Occupational History  . Occupation:  disability  Tobacco Use  . Smoking status: Current Some Day Smoker    Packs/day: 0.50    Types: Cigarettes  . Smokeless tobacco: Never Used  . Tobacco comment: a pack will last a week  Substance and Sexual Activity  . Alcohol use: Yes    Alcohol/week: 1.8 oz    Types: 3 Cans of beer per week    Comment: weekends, a couple   . Drug use: Yes    Frequency: 2.0 times per week    Types: Marijuana    Comment: for pain and leisure   . Sexual activity: Yes    Partners: Female    Birth control/protection: Condom  Other Topics Concern  . Not on file  Social History Narrative  . Not on file    Family History:   The patient's family history includes Stroke in his maternal aunt. There is no history of Heart disease.    ROS:  Please see the history of present illness.  All other ROS reviewed and negative.     Physical Exam/Data:  There were no vitals filed for this visit. No intake or output data in the 24 hours ending 03/25/17 1513 There were no vitals filed for this visit. There is no height or weight on file to calculate BMI.  General:  Well nourished, well developed, in no acute distress HEENT: normal Lymph: no adenopathy Neck: no JVD Endocrine:  No thryomegaly Vascular: No carotid bruits Cardiac:  RRR; no murmurs, gallops or rubs Lungs:  CTA b/l, no wheezing, rhonchi or rales  Abd: soft, nontender, no hepatomegaly  Ext: no edema Musculoskeletal:  No deformities, no atrophy Skin: warm and dry  Neuro:   no focal abnormalities noted Psych:  Normal affect    EKG:   personally reviewed with Dr. [name] is AFib, paced beat, QT measured, corrected is 443ms Telemetry is personally reviewed,  AFib, CVR intermittent pacing  Relevant CV Studies:  09/06/16: TTE Study Conclusions - Left ventricle: The cavity size was normal. Systolic function was   moderately to severely reduced. The estimated ejection fraction   was in the range of 30% to 35%. Diffuse hypokinesis. Moderate    hypokinesis of the anteroseptal, inferior, and inferoseptal   myocardium. - Aortic valve: Transvalvular velocity was within the normal range.   There was no stenosis. There was moderate regurgitation. Valve   area (VTI): 2.25 cm^2. Valve area (Vmax): 2.37 cm^2. Valve area   (Vmean): 2.34 cm^2. - Mitral valve: Transvalvular velocity was within the normal range.   There was no evidence for stenosis. There was mild regurgitation. - Left atrium: The atrium was moderately dilated. - Right ventricle: The cavity size was normal. Wall thickness was   normal. Systolic function was mildly reduced. - Right atrium: The atrium was moderately dilated. - Tricuspid valve: There was mild-moderate regurgitation. - Pulmonary arteries: Systolic pressure was mildly increased. PA   peak pressure:  38 mm Hg (S).   Laboratory Data:  Chemistry Recent Labs  Lab 03/25/17 1000  NA 139  K 4.1  CL 104  CO2 25  GLUCOSE 83  BUN 19  CREATININE 1.31*  CALCIUM 9.4  GFRNONAA 56*  GFRAA >60  ANIONGAP 10    No results for input(s): PROT, ALBUMIN, AST, ALT, ALKPHOS, BILITOT in the last 168 hours. HematologyNo results for input(s): WBC, RBC, HGB, HCT, MCV, MCH, MCHC, RDW, PLT in the last 168 hours. Cardiac EnzymesNo results for input(s): TROPONINI in the last 168 hours. No results for input(s): TROPIPOC in the last 168 hours.  BNPNo results for input(s): BNP, PROBNP in the last 168 hours.  DDimer No results for input(s): DDIMER in the last 168 hours.  Radiology/Studies:  No results found.  Assessment and Plan:   1. Persistent AFib, here for Tikosyn initiation     CHA2DS2Vasc is 5, on Eliquis, no missed doses     K+ 4.1     Mag 1.7, given 400mg  PO replacement out patient , repeat at 1700     Creat 1.31 (Calc CrCl is 69)     QTc 459ms  Discussed with patient's RN, OK to start Tikosyn once Magnesium is 1.8 or greater on recheck  2. Hx of NICM     Patient has an ICD     Exam appears compensated  3.  HTN     Continue home meds  4. Chronic L shoulder pain     Clearly worsened with movement of his arm     Pending out patient w/u     Declines the tramadol, says it doesn't help, hasn't been taking it     I will defer to Dr. [name]     For questions or updates, please contact [facility] Please consult [email or web address] for contact info under Cardiology/STEMI.    Signed, Baldwin Jamaica, PA-C  03/25/2017 3:13 PM    Afib persistent  NICM  ICD  HTN   CHF chronic mixed   LAE  (44/3.2/47)    He underwent cardioversion August 2018 and was lost to follow-up: ECG 12/18 atrial fibrillation with a controlled ventricular response  Is not yet clear whether sinus is associated with fewer symptoms  than atrial fibrillation.  Given his young age it is reasonable to try rhythm control as a strategy. If we are able to restore sinus rhythm, then reassessment of the left atrial size might confirm a more reasonable discussion regarding ablation for atrial fibrillation, mortality benefit based on CASTLE HF  If he does not convert on his own, we will undertake cardioversion on Wednesday

## 2017-03-26 ENCOUNTER — Other Ambulatory Visit: Payer: Self-pay

## 2017-03-26 ENCOUNTER — Encounter (HOSPITAL_COMMUNITY): Payer: Self-pay | Admitting: General Practice

## 2017-03-26 DIAGNOSIS — Z79899 Other long term (current) drug therapy: Secondary | ICD-10-CM

## 2017-03-26 DIAGNOSIS — I5022 Chronic systolic (congestive) heart failure: Secondary | ICD-10-CM

## 2017-03-26 DIAGNOSIS — Z5181 Encounter for therapeutic drug level monitoring: Secondary | ICD-10-CM

## 2017-03-26 HISTORY — DX: Other long term (current) drug therapy: Z79.899

## 2017-03-26 HISTORY — DX: Encounter for therapeutic drug level monitoring: Z51.81

## 2017-03-26 LAB — BASIC METABOLIC PANEL
ANION GAP: 12 (ref 5–15)
ANION GAP: 10 (ref 5–15)
BUN: 19 mg/dL (ref 6–20)
BUN: 18 mg/dL (ref 6–20)
CALCIUM: 8.9 mg/dL (ref 8.9–10.3)
CHLORIDE: 104 mmol/L (ref 101–111)
CHLORIDE: 105 mmol/L (ref 101–111)
CO2: 23 mmol/L (ref 22–32)
CO2: 24 mmol/L (ref 22–32)
Calcium: 9 mg/dL (ref 8.9–10.3)
Creatinine, Ser: 1.3 mg/dL — ABNORMAL HIGH (ref 0.61–1.24)
Creatinine, Ser: 1.32 mg/dL — ABNORMAL HIGH (ref 0.61–1.24)
GFR calc Af Amer: >60 mL/min (ref >60)
GFR calc non Af Amer: 56 mL/min — ABNORMAL LOW (ref >60)
GFR, EST NON AFRICAN AMERICAN: 55 mL/min — AB (ref >60)
GLUCOSE: 113 mg/dL — AB (ref 65–99)
Glucose, Bld: 106 mg/dL — ABNORMAL HIGH (ref 65–99)
POTASSIUM: 3.7 mmol/L (ref 3.5–5.1)
POTASSIUM: 4.2 mmol/L (ref 3.5–5.1)
Sodium: 139 mmol/L (ref 135–145)
Sodium: 139 mmol/L (ref 135–145)

## 2017-03-26 LAB — MAGNESIUM: Magnesium: 1.9 mg/dL (ref 1.7–2.4)

## 2017-03-26 MED ORDER — DOFETILIDE 500 MCG PO CAPS
500.0000 ug | ORAL_CAPSULE | Freq: Two times a day (BID) | ORAL | Status: DC
  Administered 2017-03-26 – 2017-03-28 (×5): 500 ug via ORAL
  Filled 2017-03-26 (×6): qty 1

## 2017-03-26 MED ORDER — MAGNESIUM OXIDE 400 (241.3 MG) MG PO TABS
400.0000 mg | ORAL_TABLET | Freq: Every day | ORAL | Status: DC
  Administered 2017-03-26: 400 mg via ORAL
  Filled 2017-03-26: qty 1

## 2017-03-26 MED ORDER — INFLUENZA VAC SPLIT HIGH-DOSE 0.5 ML IM SUSY
0.5000 mL | PREFILLED_SYRINGE | INTRAMUSCULAR | Status: AC
  Administered 2017-03-27: 0.5 mL via INTRAMUSCULAR
  Filled 2017-03-26: qty 0.5

## 2017-03-26 MED ORDER — POTASSIUM CHLORIDE CRYS ER 20 MEQ PO TBCR
20.0000 meq | EXTENDED_RELEASE_TABLET | Freq: Once | ORAL | Status: AC
  Administered 2017-03-26: 20 meq via ORAL
  Filled 2017-03-26: qty 1

## 2017-03-26 MED ORDER — ACETAMINOPHEN 325 MG PO TABS: 650.0000 mg | ORAL_TABLET | Freq: Four times a day (QID) | ORAL | Status: DC | PRN

## 2017-03-26 NOTE — Plan of Care (Signed)
  Education: Knowledge of General Education information will improve 03/26/2017 0448 - Progressing by Anson Fret, RN Note POC reviewed with pt.

## 2017-03-26 NOTE — Care Management Note (Signed)
Case Management Note  Patient Details  Name: Jorryn Casagrande MRN: [id number] Date of Birth: 1950/07/27  Subjective/Objective:  Pt presented for Persistent Atrial Fib-Plan for d/c home on Tikosyn. PTA independent from home with the support of son. Pt has Medicare/ Medicaid- he uses [facility] on [address] which is now [employer].  CSW to assist with information in regards to IDL Facilities.                   Action/Plan: CM did call [facility] and Tikosyn can be ordered. Co- pay will be $1.25. CM will make pt aware of cost and assist with Rx for 7 day supply via [place]. Pt will need a Rx for 7 day supply no refills and the original Rx with refills. No further needs from CM.   Expected Discharge Date:                  Expected Discharge Plan:  Home/Self Care  In-House Referral:  NA  Discharge planning Services  CM Consult, Medication Assistance  Post Acute Care Choice:  NA Choice offered to:  NA  DME Arranged:  N/A DME Agency:  NA  HH Arranged:  NA HH Agency:  NA  Status of Service:  Completed, signed off  If discussed at [place] of Stay Meetings, dates discussed:    Additional Comments:  Bethena Roys, RN 03/26/2017, 10:21 AM

## 2017-03-26 NOTE — Social Work (Addendum)
CSW briefly met with pt at bedside after RN Case Manager request. Pt states that he is interested in independent living options in the future and wanted to know how to go about that process.  CSW provided pt a packet of ALF options from [facility], and spoke with pt regarding many of those same options also having IDL care options. CSW also talked with pt about meeting with different options to discuss financing and next best steps.  Pt was grateful and states that "I am going to call right now!" Pt stated no further questions for CSW.   Alexander Mt, [place] Work [phone]

## 2017-03-26 NOTE — Progress Notes (Signed)
Progress Note  Patient Name: Glenn [name] Date of Encounter: 03/26/2017  Primary Cardiologist: Virl Axe, MD   Subjective   No complaints, tolerating medicine  Inpatient Medications    Scheduled Meds: . apixaban  5 mg Oral BID  . carvedilol  25 mg Oral BID WC  . diltiazem  120 mg Oral Daily  . dofetilide  500 mcg Oral BID  . furosemide  40 mg Oral Daily  . hydrALAZINE  25 mg Oral BID  . lisinopril  5 mg Oral Daily  . magnesium oxide  400 mg Oral Daily  . potassium chloride SA  20 mEq Oral Daily  . potassium chloride  20 mEq Oral Once  . sodium chloride flush  3 mL Intravenous Q12H   Continuous Infusions: . sodium chloride     PRN Meds: sodium chloride, sodium chloride flush   Vital Signs    Vitals:   03/25/17 1430 03/25/17 2127 03/26/17 0558  BP: 132/68 (!) 121/59 138/64  Pulse: 76 79 67  Resp:  16 20  Temp: (!) 97.5 F (36.4 C) 98.8 F (37.1 C) 98.5 F (36.9 C)  TempSrc: Oral Oral Oral  SpO2: 94% 98% 96%  Weight:   186 lb 8 oz (84.6 kg)    Intake/Output Summary (Last 24 hours) at 03/26/2017 0757 Last data filed at 03/25/2017 1752 Gross per 24 hour  Intake 600 ml  Output -  Net 600 ml   Filed Weights   03/26/17 0558  Weight: 186 lb 8 oz (84.6 kg)    Telemetry    Afib, intermittent V pacing - Personally Reviewed  ECG    AFib 74bpm, QTc 560ms, measured - Personally Reviewed with Dr. [name]  Physical Exam   GEN: No acute distress.   Neck: No JVD Cardiac: iRRR, no murmurs, rubs, or gallops.  Respiratory: CTA b/l  GI: Soft, nontender  MS: No edema; No deformity. Neuro:  Nonfocal  Psych: Normal affect   Labs    Chemistry Recent Labs  Lab 03/25/17 1000 03/26/17 0406  NA 139 139  K 4.1 3.7  CL 104 104  CO2 25 23  GLUCOSE 83 106*  BUN 19 19  CREATININE 1.31* 1.30*  CALCIUM 9.4 8.9  GFRNONAA 56* 56*  GFRAA >60 >60  ANIONGAP 10 12     HematologyNo results for input(s): WBC, RBC, HGB, HCT, MCV, MCH, MCHC, RDW, PLT in the last  168 hours.  Cardiac EnzymesNo results for input(s): TROPONINI in the last 168 hours. No results for input(s): TROPIPOC in the last 168 hours.   BNPNo results for input(s): BNP, PROBNP in the last 168 hours.   DDimer No results for input(s): DDIMER in the last 168 hours.   Radiology    No results found.  Cardiac Studies   09/06/16: TTE Study Conclusions - Left ventricle: The cavity size was normal. Systolic function was moderately to severely reduced. The estimated ejection fraction was in the range of 30% to 35%. Diffuse hypokinesis. Moderate hypokinesis of the anteroseptal, inferior, and inferoseptal myocardium. - Aortic valve: Transvalvular velocity was within the normal range. There was no stenosis. There was moderate regurgitation. Valve area (VTI): 2.25 cm^2. Valve area (Vmax): 2.37 cm^2. Valve area (Vmean): 2.34 cm^2. - Mitral valve: Transvalvular velocity was within the normal range. There was no evidence for stenosis. There was mild regurgitation. - Left atrium: The atrium was moderately dilated. - Right ventricle: The cavity size was normal. Wall thickness was normal. Systolic function was mildly reduced. - Right  atrium: The atrium was moderately dilated. - Tricuspid valve: There was mild-moderate regurgitation. - Pulmonary arteries: Systolic pressure was mildly increased. PA peak pressure: 38 mm Hg (S).    Patient Profile     67 y.o. male with a history of persistent AFib, HTN, CVA (July 2018, some notes state in the environment of missed xarelto doses?, was switched to Eliquis), Hep C, chronic neck/shoulder pain, hx of NICM w/ICD, hx of CHF, admitted for Tikosyn initiation.  Assessment & Plan     1. Persistent AFib, here for Tikosyn initiation     CHA2DS2Vasc is 5, on Eliquis, no missed doses     K+ 3.7, replacement ordered     Mag 1.9, will continue PO mag     Creat 1.30 (stable)     QTc 525ms  EKG is reviewed with Dr. [name], given  ICD, room on QTc, continue dose unchanged and establish QT in SR DCCV tomorrow if not in SR   2. Hx of NICM     Patient has an ICD     Exam appears compensated  3. HTN     no changes  4. Chronic L shoulder pain     Clearly worsened with movement of his arm     Pending out patient w/u     Declines the tramadol, says it doesn't help, hasn't been taking it     will defer to his PMD out patient     For questions or updates, please contact [facility] Please consult [email or web address] for contact info under Cardiology/STEMI.      Signed, Baldwin Jamaica, PA-C  03/26/2017, 7:57 AM    Seen and examined as above Still in afib Plan is for DCCV in am if not spontaneously converted

## 2017-03-26 NOTE — H&P (View-Only) (Signed)
Progress Note  Patient Name: Glenn [name] Date of Encounter: 03/26/2017  Primary Cardiologist: Virl Axe, MD   Subjective   No complaints, tolerating medicine  Inpatient Medications    Scheduled Meds: . apixaban  5 mg Oral BID  . carvedilol  25 mg Oral BID WC  . diltiazem  120 mg Oral Daily  . dofetilide  500 mcg Oral BID  . furosemide  40 mg Oral Daily  . hydrALAZINE  25 mg Oral BID  . lisinopril  5 mg Oral Daily  . magnesium oxide  400 mg Oral Daily  . potassium chloride SA  20 mEq Oral Daily  . potassium chloride  20 mEq Oral Once  . sodium chloride flush  3 mL Intravenous Q12H   Continuous Infusions: . sodium chloride     PRN Meds: sodium chloride, sodium chloride flush   Vital Signs    Vitals:   03/25/17 1430 03/25/17 2127 03/26/17 0558  BP: 132/68 (!) 121/59 138/64  Pulse: 76 79 67  Resp:  16 20  Temp: (!) 97.5 F (36.4 C) 98.8 F (37.1 C) 98.5 F (36.9 C)  TempSrc: Oral Oral Oral  SpO2: 94% 98% 96%  Weight:   186 lb 8 oz (84.6 kg)    Intake/Output Summary (Last 24 hours) at 03/26/2017 0757 Last data filed at 03/25/2017 1752 Gross per 24 hour  Intake 600 ml  Output -  Net 600 ml   Filed Weights   03/26/17 0558  Weight: 186 lb 8 oz (84.6 kg)    Telemetry    Afib, intermittent V pacing - Personally Reviewed  ECG    AFib 74bpm, QTc 561ms, measured - Personally Reviewed with Dr. [name]  Physical Exam   GEN: No acute distress.   Neck: No JVD Cardiac: iRRR, no murmurs, rubs, or gallops.  Respiratory: CTA b/l  GI: Soft, nontender  MS: No edema; No deformity. Neuro:  Nonfocal  Psych: Normal affect   Labs    Chemistry Recent Labs  Lab 03/25/17 1000 03/26/17 0406  NA 139 139  K 4.1 3.7  CL 104 104  CO2 25 23  GLUCOSE 83 106*  BUN 19 19  CREATININE 1.31* 1.30*  CALCIUM 9.4 8.9  GFRNONAA 56* 56*  GFRAA >60 >60  ANIONGAP 10 12     HematologyNo results for input(s): WBC, RBC, HGB, HCT, MCV, MCH, MCHC, RDW, PLT in the last  168 hours.  Cardiac EnzymesNo results for input(s): TROPONINI in the last 168 hours. No results for input(s): TROPIPOC in the last 168 hours.   BNPNo results for input(s): BNP, PROBNP in the last 168 hours.   DDimer No results for input(s): DDIMER in the last 168 hours.   Radiology    No results found.  Cardiac Studies   09/06/16: TTE Study Conclusions - Left ventricle: The cavity size was normal. Systolic function was moderately to severely reduced. The estimated ejection fraction was in the range of 30% to 35%. Diffuse hypokinesis. Moderate hypokinesis of the anteroseptal, inferior, and inferoseptal myocardium. - Aortic valve: Transvalvular velocity was within the normal range. There was no stenosis. There was moderate regurgitation. Valve area (VTI): 2.25 cm^2. Valve area (Vmax): 2.37 cm^2. Valve area (Vmean): 2.34 cm^2. - Mitral valve: Transvalvular velocity was within the normal range. There was no evidence for stenosis. There was mild regurgitation. - Left atrium: The atrium was moderately dilated. - Right ventricle: The cavity size was normal. Wall thickness was normal. Systolic function was mildly reduced. - Right  atrium: The atrium was moderately dilated. - Tricuspid valve: There was mild-moderate regurgitation. - Pulmonary arteries: Systolic pressure was mildly increased. PA peak pressure: 38 mm Hg (S).    Patient Profile     67 y.o. male with a history of persistent AFib, HTN, CVA (July 2018, some notes state in the environment of missed xarelto doses?, was switched to Eliquis), Hep C, chronic neck/shoulder pain, hx of NICM w/ICD, hx of CHF, admitted for Tikosyn initiation.  Assessment & Plan     1. Persistent AFib, here for Tikosyn initiation     CHA2DS2Vasc is 5, on Eliquis, no missed doses     K+ 3.7, replacement ordered     Mag 1.9, will continue PO mag     Creat 1.30 (stable)     QTc 563ms  EKG is reviewed with Dr. [name], given  ICD, room on QTc, continue dose unchanged and establish QT in SR DCCV tomorrow if not in SR   2. Hx of NICM     Patient has an ICD     Exam appears compensated  3. HTN     no changes  4. Chronic L shoulder pain     Clearly worsened with movement of his arm     Pending out patient w/u     Declines the tramadol, says it doesn't help, hasn't been taking it     will defer to his PMD out patient     For questions or updates, please contact [facility] Please consult [email or web address] for contact info under Cardiology/STEMI.      Signed, Baldwin Jamaica, PA-C  03/26/2017, 7:57 AM    Seen and examined as above Still in afib Plan is for DCCV in am if not spontaneously converted

## 2017-03-26 NOTE — Progress Notes (Signed)
   03/26/17 1100  Clinical Encounter Type  Visited With Patient  Visit Type Initial  Referral From Chaplain  Consult/Referral To Chaplain  Spiritual Encounters  Spiritual Needs Emotional  Stress Factors  Patient Stress Factors None identified  Family Stress Factors None identified    Chaplain visited with Pt who was standing in the hallway. Pt explain that he was in hospital just to get medicine regulated correctly. Chaplain provided spiritual encouragement.  Tarrin Lebow a Medical sales representative, [employer]

## 2017-03-27 ENCOUNTER — Inpatient Hospital Stay (HOSPITAL_COMMUNITY): Payer: Medicare Other | Admitting: Anesthesiology

## 2017-03-27 ENCOUNTER — Encounter (HOSPITAL_COMMUNITY): Payer: Self-pay | Admitting: Anesthesiology

## 2017-03-27 ENCOUNTER — Encounter (HOSPITAL_COMMUNITY)
Admission: RE | Disposition: A | Discharge status: Home / Self Care | Payer: Self-pay | Source: Ambulatory Visit | Attending: Internal Medicine

## 2017-03-27 DIAGNOSIS — I480 Paroxysmal atrial fibrillation: Secondary | ICD-10-CM

## 2017-03-27 HISTORY — PX: CARDIOVERSION: SHX1299

## 2017-03-27 LAB — BASIC METABOLIC PANEL
Anion gap: 10 (ref 5–15)
BUN: 15 mg/dL (ref 6–20)
CALCIUM: 9 mg/dL (ref 8.9–10.3)
CO2: 24 mmol/L (ref 22–32)
CREATININE: 1.21 mg/dL (ref 0.61–1.24)
Chloride: 105 mmol/L (ref 101–111)
GFR calc Af Amer: >60 mL/min (ref >60)
GLUCOSE: 103 mg/dL — AB (ref 65–99)
Potassium: 3.9 mmol/L (ref 3.5–5.1)
Sodium: 139 mmol/L (ref 135–145)

## 2017-03-27 LAB — MAGNESIUM: Magnesium: 1.9 mg/dL (ref 1.7–2.4)

## 2017-03-27 SURGERY — CARDIOVERSION
Anesthesia: General

## 2017-03-27 MED ORDER — SODIUM CHLORIDE 0.9% FLUSH: 3.0000 mL | Freq: Two times a day (BID) | INTRAVENOUS | Status: DC

## 2017-03-27 MED ORDER — LIDOCAINE HCL (CARDIAC) 20 MG/ML IV SOLN
INTRAVENOUS | Status: DC | PRN
  Administered 2017-03-27: 100 mg via INTRATRACHEAL

## 2017-03-27 MED ORDER — POTASSIUM CHLORIDE CRYS ER 20 MEQ PO TBCR
20.0000 meq | EXTENDED_RELEASE_TABLET | Freq: Once | ORAL | Status: AC
  Administered 2017-03-27: 20 meq via ORAL
  Filled 2017-03-27: qty 1

## 2017-03-27 MED ORDER — PROPOFOL 10 MG/ML IV BOLUS
INTRAVENOUS | Status: DC | PRN
  Administered 2017-03-27: 80 mg via INTRAVENOUS

## 2017-03-27 MED ORDER — SODIUM CHLORIDE 0.9 % IV SOLN
250.0000 mL | INTRAVENOUS | Status: DC
  Administered 2017-03-27: 250 mL via INTRAVENOUS
  Administered 2017-03-27: 09:00:00 via INTRAVENOUS

## 2017-03-27 MED ORDER — MAGNESIUM OXIDE 400 (241.3 MG) MG PO TABS
400.0000 mg | ORAL_TABLET | Freq: Two times a day (BID) | ORAL | Status: DC
  Administered 2017-03-27 – 2017-03-28 (×3): 400 mg via ORAL
  Filled 2017-03-27 (×3): qty 1

## 2017-03-27 MED ORDER — HYDROCORTISONE 1 % EX CREA
1.0000 | TOPICAL_CREAM | Freq: Three times a day (TID) | CUTANEOUS | Status: DC | PRN
Start: 2017-03-27 — End: 2017-03-28
  Filled 2017-03-27: qty 28

## 2017-03-27 MED ORDER — SODIUM CHLORIDE 0.9% FLUSH: 3.0000 mL | INTRAVENOUS | Status: DC | PRN

## 2017-03-27 NOTE — Progress Notes (Signed)
Pt had 6 beats of PVC run around 1545.  Pt stated that he just used a bathroom and felt a little tightness in his chest. Pt denis chest pain/tightness after that.  EP made aware.   Idolina Primer, RN

## 2017-03-27 NOTE — Discharge Instructions (Signed)
You have an appointment set up with the [facility].  Multiple studies have shown that being followed by a dedicated atrial fibrillation clinic in addition to the standard care you receive from your other physicians improves health. We believe that enrollment in the atrial fibrillation clinic will allow [place] to better care for you.   The phone number to the [facility] is [phone]. The clinic is staffed Monday through Friday from 8:30am to 5pm.  Parking Directions: The clinic is located in the Heart and Vascular Building connected to [facility]. 1)From [address] turn on to [address] and go to the 3rd entrance  (Heart and Vascular entrance) on the right. 2)Look to the right for Heart &Vascular Parking Garage. 3)A code for the entrance is required please call the clinic to receive this.   4)Take the elevators to the 1st floor. Registration is in the room with the glass walls at the end of the hallway.  If you have any trouble parking or locating the clinic, please dont hesitate to call [phone].      Electrical Cardioversion, Care After This sheet gives you information about how to care for yourself after your procedure. Your health care provider may also give you more specific instructions. If you have problems or questions, contact your health care provider. What can I expect after the procedure? After the procedure, it is common to have:  Some redness on the skin where the shocks were given.  Follow these instructions at home:  Do not drive for 24 hours if you were given a medicine to help you relax (sedative).  Take over-the-counter and prescription medicines only as told by your health care provider.  Ask your health care provider how to check your pulse. Check it often.  Rest for 48 hours after the procedure or as told by your health care provider.  Avoid or limit your caffeine use as told by your health care  provider. Contact a health care provider if:  You feel like your heart is beating too quickly or your pulse is not regular.  You have a serious muscle cramp that does not go away. Get help right away if:  You have discomfort in your chest.  You are dizzy or you feel faint.  You have trouble breathing or you are short of breath.  Your speech is slurred.  You have trouble moving an arm or leg on one side of your body.  Your fingers or toes turn cold or blue. This information is not intended to replace advice given to you by your health care provider. Make sure you discuss any questions you have with your health care provider. Document Released: 11/26/2012 Document Revised: 09/09/2015 Document Reviewed: 08/12/2015 Elsevier Interactive Patient Education  [employer].

## 2017-03-27 NOTE — Anesthesia Preprocedure Evaluation (Signed)
Anesthesia Evaluation  Patient identified by MRN, date of birth, ID band Patient awake    Reviewed: Allergy & Precautions, NPO status , Patient's Chart, lab work & pertinent test results  Airway Mallampati: I  TM Distance: >3 FB Neck ROM: Full    Dental   Pulmonary Current Smoker,    Pulmonary exam normal        Cardiovascular hypertension, Pt. on medications Normal cardiovascular exam     Neuro/Psych CVA    GI/Hepatic (+) Hepatitis -, C  Endo/Other    Renal/GU      Musculoskeletal   Abdominal   Peds  Hematology   Anesthesia Other Findings   Reproductive/Obstetrics                             Anesthesia Physical Anesthesia Plan  ASA: III  Anesthesia Plan: General   Post-op Pain Management:    Induction: Intravenous  PONV Risk Score and Plan: 1 and Treatment may vary due to age or medical condition  Airway Management Planned: Mask  Additional Equipment:   Intra-op Plan:   Post-operative Plan:   Informed Consent: I have reviewed the patients History and Physical, chart, labs and discussed the procedure including the risks, benefits and alternatives for the proposed anesthesia with the patient or authorized representative who has indicated his/her understanding and acceptance.     Plan Discussed with: CRNA and Surgeon  Anesthesia Plan Comments:         Anesthesia Quick Evaluation

## 2017-03-27 NOTE — Transfer of Care (Signed)
Immediate Anesthesia Transfer of Care Note  Patient: Glenn [name]  Procedure(s) Performed: CARDIOVERSION (N/A )  Patient Location: Endoscopy Unit  Anesthesia Type:General  Level of Consciousness: awake, alert , oriented and patient cooperative  Airway & Oxygen Therapy: Patient Spontanous Breathing and Patient connected to nasal cannula oxygen  Post-op Assessment: Report given to RN and Post -op Vital signs reviewed and stable  Post vital signs: Reviewed and stable  Last Vitals:  Vitals:   03/27/17 0812 03/27/17 0818  BP: (!) 132/109 (!) 146/75  Pulse: (!) 122 (!) 108  Resp: 20 20  Temp: 36.7 C   SpO2: 98% 97%    Last Pain:  Vitals:   03/27/17 0812  TempSrc: Oral  PainSc: 10-Worst pain ever      Patients Stated Pain Goal: 2 (80/06/34 9494)  Complications: No apparent anesthesia complications

## 2017-03-27 NOTE — Progress Notes (Signed)
Progress Note  Patient Name: Glenn [name] Date of Encounter: 03/27/2017  Primary Cardiologist: Virl Axe, MD   Subjective   Without complaint and tolerating the drugs  No sbo or chest pain   Inpatient Medications    Scheduled Meds: . apixaban  5 mg Oral BID  . carvedilol  25 mg Oral BID WC  . diltiazem  120 mg Oral Daily  . dofetilide  500 mcg Oral BID  . furosemide  40 mg Oral Daily  . hydrALAZINE  25 mg Oral BID  . Influenza vac split quadrivalent PF  0.5 mL Intramuscular Tomorrow-1000  . lisinopril  5 mg Oral Daily  . magnesium oxide  400 mg Oral Daily  . potassium chloride SA  20 mEq Oral Daily  . sodium chloride flush  3 mL Intravenous Q12H   Continuous Infusions: . sodium chloride     PRN Meds: sodium chloride, acetaminophen, sodium chloride flush   Vital Signs    Vitals:   03/26/17 1406 03/26/17 1442 03/26/17 2157 03/27/17 0535  BP:  130/76 115/71 132/90  Pulse:  64 78   Resp: 20  18 17   Temp:  98 F (36.7 C) 98.3 F (36.8 C) 97.7 F (36.5 C)  TempSrc:  Oral Oral Oral  SpO2:   98% 97%  Weight: 186 lb 8 oz (84.6 kg)   183 lb 14.4 oz (83.4 kg)  Height: 5\' 7"  (1.702 m)       Intake/Output Summary (Last 24 hours) at 03/27/2017 4854 Last data filed at 03/26/2017 2200 Gross per 24 hour  Intake 723 ml  Output -  Net 723 ml   Filed Weights   03/26/17 0558 03/26/17 1406 03/27/17 0535  Weight: 186 lb 8 oz (84.6 kg) 186 lb 8 oz (84.6 kg) 183 lb 14.4 oz (83.4 kg)    Telemetry    afib with intermittent pacing and some VT NS  This dates back to pre dofetilide  - Personally Reviewed  ECG    Will review post DCCV   Physical Exam   Well developed and nourished in no acute distress HENT normal Neck supple with JVP-flat Carotids brisk and full without bruits Clear Irregularly irregular rate and rhythm with controlled ventricular response, no murmurs or gallops Abd-soft with active BS without hepatomegaly No Clubbing cyanosis edema Skin-warm  and dry A & Oriented  Grossly normal sensory and motor function   Labs    Chemistry Recent Labs  Lab 03/26/17 0406 03/26/17 2042 03/27/17 0412  NA 139 139 139  K 3.7 4.2 3.9  CL 104 105 105  CO2 23 24 24   GLUCOSE 106* 113* 103*  BUN 19 18 15   CREATININE 1.30* 1.32* 1.21  CALCIUM 8.9 9.0 9.0  GFRNONAA 56* 55* >60  GFRAA >60 >60 >60  ANIONGAP 12 10 10      HematologyNo results for input(s): WBC, RBC, HGB, HCT, MCV, MCH, MCHC, RDW, PLT in the last 168 hours.  Cardiac EnzymesNo results for input(s): TROPONINI in the last 168 hours. No results for input(s): TROPIPOC in the last 168 hours.   BNPNo results for input(s): BNP, PROBNP in the last 168 hours.   DDimer No results for input(s): DDIMER in the last 168 hours.   Radiology    No results found.  Cardiac Studies   09/06/16: TTE Study Conclusions - Left ventricle: The cavity size was normal. Systolic function was moderately to severely reduced. The estimated ejection fraction was in the range of 30% to 35%. Diffuse hypokinesis.  Moderate hypokinesis of the anteroseptal, inferior, and inferoseptal myocardium. - Aortic valve: Transvalvular velocity was within the normal range. There was no stenosis. There was moderate regurgitation. Valve area (VTI): 2.25 cm^2. Valve area (Vmax): 2.37 cm^2. Valve area (Vmean): 2.34 cm^2. - Mitral valve: Transvalvular velocity was within the normal range. There was no evidence for stenosis. There was mild regurgitation. - Left atrium: The atrium was moderately dilated. - Right ventricle: The cavity size was normal. Wall thickness was normal. Systolic function was mildly reduced. - Right atrium: The atrium was moderately dilated. - Tricuspid valve: There was mild-moderate regurgitation. - Pulmonary arteries: Systolic pressure was mildly increased. PA peak pressure: 38 mm Hg (S).    Patient Profile     67 y.o. male with a history of persistent AFib, HTN, CVA  (July 2018, some notes state in the environment of missed xarelto doses?, was switched to Eliquis), Hep C, chronic neck/shoulder pain, hx of NICM w/ICD, hx of CHF, admitted for Tikosyn initiation.  Assessment & Plan     Persistent AFib, here for Tikosyn initiation   NICM    HTN   ICD in situ  Hypokalemia relative  Hypomagnesemia relative  VT NS     Will replete K and Mg and and anticipate DCCV this am  VT NS present prior to dofetilide but somewhat worrisome In that it was pause dependent, following a pause and paced beat---follow closely  Will anticipate discharge tomorrow  Will need repeat echo in sinus     Signed, Virl Axe, MD  03/27/2017, 6:33 AM

## 2017-03-27 NOTE — Interval H&P Note (Signed)
History and Physical Interval Note:  03/27/2017 8:39 AM  Glenn [name]  has presented today for surgery, with the diagnosis of afib  The various methods of treatment have been discussed with the patient and family. After consideration of risks, benefits and other options for treatment, the patient has consented to  Procedure(s): CARDIOVERSION (N/A) as a surgical intervention .  The patient's history has been reviewed, patient examined, no change in status, stable for surgery.  I have reviewed the patient's chart and labs.  Questions were answered to the patient's satisfaction.     [employer]

## 2017-03-27 NOTE — Discharge Summary (Signed)
ELECTROPHYSIOLOGY PROCEDURE DISCHARGE SUMMARY    Patient ID: Glenn [name],  MRN: [id number], DOB/AGE: May 23, 1950 67 y.o.  Admit date: 03/25/2017 Discharge date: 03/28/17  Primary Care Physician: Rogue Bussing, MD  Electrophysiologist: Dr. [name]  Primary Discharge Diagnosis:  1.  Persistent atrial fibrillation status post Tikosyn loading this admission  Secondary Discharge Diagnosis:  1. HTN 2. CVA (old) 3. NICM w/ICD 4. Chronic CHF     Compensated currently  No Known Allergies   Procedures This Admission:  1.  Tikosyn loading 2.  Direct current cardioversion on 03/27/16 by Dr Marlou Porch which successfully restored SR.  There were no early apparent complications.   Brief HPI: Glenn [name] is a 67 y.o. male with a past medical history as noted above.  The patient is followed by Dr. [name] and AFib clinic in the outpatient setting for treatment options of atrial fibrillation.  Risks, benefits, and alternatives to Tikosyn were reviewed with the patient who wished to proceed.    Hospital Course:  The patient was admitted and Tikosyn was initiated.  Renal function and electrolytes were followed during the hospitalization.  He required magnesium supplementation as well as intermittent potassium,he was nt taking his home K+ daily, we discussed the importance, we have rx for mag ox.  His QTc remained stable.  On 03/27/17 he underwent direct current cardioversion which restored sinus rhythm.  The patient was monitored until discharge on telemetry which demonstrated SR.  He had at baseline PVC's that were initially frequent, and rare NSVT monomorphic was observed.  On the day of discharge, the patient was examined by Dr [name] who considered him stable for discharge to home.  Follow-up has been arranged with the AFib clinic in 1 week and with Dr  [name] in 4 weeks.   Physical Exam: Vitals:   03/27/17 1356 03/27/17 1644 03/27/17 1934 03/28/17 0340  BP: 119/60 128/71 (!) 119/57  126/63  Pulse: 78  64 67  Resp:   16 14  Temp: 98 F (36.7 C)  98 F (36.7 C) 98 F (36.7 C)  TempSrc: Oral  Oral Oral  SpO2:   98% 95%  Weight:    185 lb 14.4 oz (84.3 kg)  Height:        GEN- The patient is well appearing, alert and oriented x 3 today.   HEENT: normocephalic, atraumatic; sclera clear, conjunctiva pink; hearing intact; oropharynx clear; neck supple, no JVP Lymph- no cervical lymphadenopathy Lungs- CTA b/l, normal work of breathing.  No wheezes, rales, rhonchi Heart- RRR, no murmurs, rubs or gallops, PMI not laterally displaced GI- soft, non-tender, non-distended Extremities- no clubbing, cyanosis, or edema MS- no significant deformity or atrophy Skin- warm and dry, no rash or lesion Psych- euthymic mood, full affect Neuro- strength and sensation are intact   Labs:   Lab Results  Component Value Date   WBC 3.3 (L) 02/14/2017   HGB 13.5 02/14/2017   HCT 40.0 02/14/2017   MCV 96.9 02/14/2017   PLT 163 02/14/2017    Recent Labs  Lab 03/28/17 0331  NA 138  K 4.1  CL 102  CO2 24  BUN 13  CREATININE 1.19  CALCIUM 9.1  GLUCOSE 96     Discharge Medications:  Allergies as of 03/28/2017   No Known Allergies     Medication List    TAKE these medications   apixaban 5 MG Tabs tablet Commonly known as:  ELIQUIS Take 1 tablet (5 mg total) by mouth 2 (two) times  daily.   busPIRone 7.5 MG tablet Commonly known as:  BUSPAR Take 7.5 mg by mouth daily as needed for depression.   carvedilol 25 MG tablet Commonly known as:  COREG Take 1 tablet (25 mg total) by mouth 2 (two) times daily with a meal.   diclofenac sodium 1 % Gel Commonly known as:  VOLTAREN Apply to affected area as needed twice daily.   diltiazem 120 MG 24 hr capsule Commonly known as:  CARDIZEM CD Take 1 capsule (120 mg total) by mouth daily.   dofetilide 500 MCG capsule Commonly known as:  TIKOSYN Take 1 capsule (500 mcg total) by mouth 2 (two) times daily.   furosemide 40 MG  tablet Commonly known as:  LASIX Take 1 tablet (40 mg total) by mouth daily.   hydrALAZINE 25 MG tablet Commonly known as:  APRESOLINE Take 1 tablet (25 mg total) by mouth 2 (two) times daily.   lisinopril 5 MG tablet Commonly known as:  PRINIVIL,ZESTRIL Take 5 mg by mouth daily.   magnesium oxide 400 (241.3 Mg) MG tablet Commonly known as:  MAG-OX Take 1 tablet (400 mg total) by mouth 2 (two) times daily.   potassium chloride SA 20 MEQ tablet Commonly known as:  K-DUR,KLOR-CON Take 1 tablet (20 mEq total) by mouth daily.       Disposition:  Home  Discharge Instructions    Diet - low sodium heart healthy   Complete by:  As directed    Increase activity slowly   Complete by:  As directed      Follow-up Information    [facility] Follow up on 04/04/2017.   Specialty:  Cardiology Why:  1:30PM Contact information: [address] [id number] mc [place] [phone]       [name], MD Follow up on 04/22/2017.   Specialty:  Cardiology Why:  9:30AM Contact information: [address] [address] [phone]           Duration of Discharge Encounter: Greater than 30 minutes including physician time.  Venetia Night, PA-C 03/28/2017 12:55 PM

## 2017-03-27 NOTE — CV Procedure (Signed)
    Electrical Cardioversion Procedure Note Glenn [name] [id number] 1950/07/08  Procedure: Electrical Cardioversion Indications:  Atrial Fibrillation  Time Out: Verified patient identification, verified procedure,medications/allergies/relevent history reviewed, required imaging and test results available.  Performed  Procedure Details  The patient was NPO after midnight. Anesthesia was administered at the beside  by [facility] with 80mg  of propofol.  Cardioversion was performed with synchronized biphasic defibrillation via AP pads with 120 joules.  1 attempt(s) were performed.  The patient converted to normal sinus rhythm. The patient tolerated the procedure well   IMPRESSION:  Successful cardioversion of atrial fibrillation on Tikosyn. ICD, single lead [place], functioning well.     Glenn [name] 03/27/2017, 9:05 AM

## 2017-03-27 NOTE — Anesthesia Postprocedure Evaluation (Signed)
Anesthesia Post Note  Patient: Glenn [name]  Procedure(s) Performed: CARDIOVERSION (N/A )     Anesthesia Post Evaluation  Last Vitals:  Vitals:   03/27/17 0910 03/27/17 0920  BP: (!) 144/71 (!) 154/83  Pulse: 71 75  Resp: 15 17  Temp:    SpO2: 99% 97%    Last Pain:  Vitals:   03/27/17 0812  TempSrc: Oral  PainSc: 10-Worst pain ever                 [name]

## 2017-03-27 NOTE — Anesthesia Postprocedure Evaluation (Signed)
Anesthesia Post Note  Patient: Glenn [name]  Procedure(s) Performed: CARDIOVERSION (N/A )     Patient location during evaluation: Endoscopy Anesthesia Type: General Level of consciousness: awake and alert and oriented Pain management: pain level controlled Vital Signs Assessment: post-procedure vital signs reviewed and stable Respiratory status: spontaneous breathing and respiratory function stable Cardiovascular status: blood pressure returned to baseline and stable Postop Assessment: no headache, no backache, no apparent nausea or vomiting and adequate PO intake Anesthetic complications: no    Last Vitals:  Vitals:   03/27/17 0812 03/27/17 0818  BP: (!) 132/109 (!) 146/75  Pulse: (!) 122 (!) 108  Resp: 20 20  Temp: 36.7 C   SpO2: 98% 97%    Last Pain:  Vitals:   03/27/17 0812  TempSrc: Oral  PainSc: 10-Worst pain ever                 Rehmat Murtagh

## 2017-03-28 LAB — BASIC METABOLIC PANEL WITH GFR
Anion gap: 12 (ref 5–15)
BUN: 13 mg/dL (ref 6–20)
CO2: 24 mmol/L (ref 22–32)
Calcium: 9.1 mg/dL (ref 8.9–10.3)
Chloride: 102 mmol/L (ref 101–111)
Creatinine, Ser: 1.19 mg/dL (ref 0.61–1.24)
GFR calc Af Amer: >60 mL/min
GFR calc non Af Amer: >60 mL/min
Glucose, Bld: 96 mg/dL (ref 65–99)
Potassium: 4.1 mmol/L (ref 3.5–5.1)
Sodium: 138 mmol/L (ref 135–145)

## 2017-03-28 LAB — MAGNESIUM: Magnesium: 1.8 mg/dL (ref 1.7–2.4)

## 2017-03-28 MED ORDER — DOFETILIDE 500 MCG PO CAPS: 500.0000 ug | ORAL_CAPSULE | Freq: Two times a day (BID) | ORAL | 6 refills | Status: DC

## 2017-03-28 MED ORDER — POTASSIUM CHLORIDE ER 20 MEQ PO TBCR: 30.0000 meq | EXTENDED_RELEASE_TABLET | Freq: Every day | ORAL | 6 refills | Status: DC

## 2017-03-28 MED ORDER — MAGNESIUM OXIDE 400 (241.3 MG) MG PO TABS: 400.0000 mg | ORAL_TABLET | Freq: Two times a day (BID) | ORAL | 6 refills | Status: DC

## 2017-03-28 NOTE — Plan of Care (Signed)
Anticipated discharge

## 2017-04-01 ENCOUNTER — Other Ambulatory Visit: Payer: Self-pay

## 2017-04-01 ENCOUNTER — Encounter: Payer: Self-pay | Admitting: Internal Medicine

## 2017-04-01 ENCOUNTER — Ambulatory Visit (INDEPENDENT_AMBULATORY_CARE_PROVIDER_SITE_OTHER): Payer: Medicare Other | Admitting: Internal Medicine

## 2017-04-01 VITALS — BP 112/74 | HR 74 | Temp 98.0°F | Ht 67.0 in | Wt 195.4 lb

## 2017-04-01 DIAGNOSIS — M542 Cervicalgia: Secondary | ICD-10-CM | POA: Diagnosis present

## 2017-04-01 DIAGNOSIS — M549 Dorsalgia, unspecified: Secondary | ICD-10-CM

## 2017-04-01 DIAGNOSIS — I255 Ischemic cardiomyopathy: Secondary | ICD-10-CM

## 2017-04-01 MED ORDER — DULOXETINE HCL 30 MG PO CPEP: ORAL_CAPSULE | ORAL | 0 refills | Status: DC

## 2017-04-01 NOTE — Patient Instructions (Signed)
Mr. Bingaman,  I am going to refer you to physical therapy.   Try to get a pillow with neck support.  Start cymbalta 30 mg nightly. You can increase this to 60 mg nightly after a week if you are tolerating this well.  Please see me in about 2 weeks to see how things are going.  Best, Dr. Ola Spurr
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
## 2017-04-04 ENCOUNTER — Ambulatory Visit (HOSPITAL_COMMUNITY)
Admit: 2017-04-04 | Discharge: 2017-04-04 | Disposition: A | Discharge status: Home / Self Care | Payer: Medicare Other | Source: Ambulatory Visit | Attending: Nurse Practitioner | Admitting: Nurse Practitioner

## 2017-04-04 VITALS — BP 134/76 | HR 53 | Wt 190.0 lb

## 2017-04-04 DIAGNOSIS — I509 Heart failure, unspecified: Secondary | ICD-10-CM | POA: Insufficient documentation

## 2017-04-04 DIAGNOSIS — F17210 Nicotine dependence, cigarettes, uncomplicated: Secondary | ICD-10-CM | POA: Insufficient documentation

## 2017-04-04 DIAGNOSIS — I481 Persistent atrial fibrillation: Secondary | ICD-10-CM

## 2017-04-04 DIAGNOSIS — Z7901 Long term (current) use of anticoagulants: Secondary | ICD-10-CM | POA: Insufficient documentation

## 2017-04-04 DIAGNOSIS — Z95810 Presence of automatic (implantable) cardiac defibrillator: Secondary | ICD-10-CM | POA: Diagnosis not present

## 2017-04-04 DIAGNOSIS — I110 Hypertensive heart disease with heart failure: Secondary | ICD-10-CM | POA: Insufficient documentation

## 2017-04-04 DIAGNOSIS — B1920 Unspecified viral hepatitis C without hepatic coma: Secondary | ICD-10-CM | POA: Diagnosis not present

## 2017-04-04 DIAGNOSIS — Z8673 Personal history of transient ischemic attack (TIA), and cerebral infarction without residual deficits: Secondary | ICD-10-CM | POA: Diagnosis not present

## 2017-04-04 DIAGNOSIS — Z79899 Other long term (current) drug therapy: Secondary | ICD-10-CM | POA: Diagnosis not present

## 2017-04-04 DIAGNOSIS — I4819 Other persistent atrial fibrillation: Secondary | ICD-10-CM

## 2017-04-04 LAB — BASIC METABOLIC PANEL
Anion gap: 9 (ref 5–15)
BUN: 8 mg/dL (ref 6–20)
CALCIUM: 9.1 mg/dL (ref 8.9–10.3)
CO2: 23 mmol/L (ref 22–32)
Chloride: 104 mmol/L (ref 101–111)
Creatinine, Ser: 1.28 mg/dL — ABNORMAL HIGH (ref 0.61–1.24)
GFR calc Af Amer: >60 mL/min (ref >60)
GFR, EST NON AFRICAN AMERICAN: 57 mL/min — AB (ref >60)
GLUCOSE: 100 mg/dL — AB (ref 65–99)
Potassium: 4.8 mmol/L (ref 3.5–5.1)
Sodium: 136 mmol/L (ref 135–145)

## 2017-04-04 LAB — MAGNESIUM: Magnesium: 1.9 mg/dL (ref 1.7–2.4)

## 2017-04-04 NOTE — Progress Notes (Signed)
Primary Care Physician: Rogue Bussing, MD Referring Physician: Dr. Demetrius Charity Bisesi is a 67 y.o. male with a h/o afib on Xarelto with ICD, CHF, hepatitis C and hypertension who presented to [facility] 09/05/2016, with left MCA CVA.He did not receive TPA due to arriving outside the treatment window and being on Xarelto. He underwent chemical thrombectomy for left M2 occlusion. Patient was switched to Eliquis for secondary stroke prevention and aspirin 81 mg was added given his cardiac history. 2-D echo in the hospital LVEF 30-35%.   Patient moved here from [place] in May. He has a 2 year history of Atrial fibrillation and had an ICD-[employer]a 12/2015 put in. Says he had a heart cath that showed no blockages but he had CHF. Smokes 1 ppd 30-40 yrs. Says he had chest tightness and shortness of breath for about a week prior to CVA. Also missed a couple of Xarelto doses.   Patient saw Dr. [name] as a new patient 09/19/16. Device interrogation demonstrated rapid atrial rates with averages over 100 bpm. He also had acute on chronic CHF. Dr. [name] felt the atrial fibrillation was likely contributing to his heart failure. Low-dose diltiazem was added despite the fact that he had CHF,based on the presumption that his atrial fibrillation rate was contributing to his heart failure. He also wanted to attempt cardioversion once. His LA size has discordant measurements. He felt if he could maintain sinus rhythm and potentially dofetilide or amiodarone might be beneficial. Lasix was increased to 40 mg daily.   He underwent successful cardioversion 10/10/16.  In the ED 02/14/17 with left chest and left shoulder pain that was reproducible to touch.  He was discharged on tramadol.   Patient was seen by [name] 03/19/17.  He had stopped his lisinopril for 2 months because it was making him feel dizzy but he started it back 3 days ago taking it at night.  He says this has helped.  Blood  pressure was quite low when he arrived at 78/46 but on recheck it was 98/46.  He also says his heart is skipping and racing especially at night when he lays down.  His breathing is stable.  Complaining of chronic aching pain in his shoulder and neck.   He said his tramadol made him feel like a zombie and he cannot tolerate it. Dr. [name] consulted re pt and  did discuss possible dofetilide or amiodarone.  Pt is now in the afib clinic, 2/4, for discussion of admission for Tikosyn. Pt has medicare/medicaid and cost should not be a problem. Specifics re Tikosyn discussed with pt, especially that he has to take specifically as written and can not miss doses, f/u is very important as scheduled with labs/ekg's. He would like to proceed. PharmD has reviewed drugs and no contraindicated or QTc prolonging medications. Denies any benadryl use, no missed doses of eliquis.    F/u hospitalization [facility] 2/4 thru 2/7 for tikosyn loading. He had successful cardioversion and left the hospital in [place] with stable qtc. He is taking Tikosyn correctly and feels improved. He has noted some issues with erectile dysfunction.  Today, he denies symptoms of palpitations, chest pain, orthopnea, PND, lower extremity edema, dizziness, presyncope, syncope, or neurologic sequela. + for fatigue and shortness of breath with afib. The patient is tolerating medications without difficulties and is otherwise without complaint today.   Past Medical History:  Diagnosis Date  . Atrial fibrillation ([place])   . CHF (congestive heart failure) ([place])   .  Hepatitis C   . Hypertension   . Stroke ([place])   . Visit for monitoring Tikosyn therapy 03/26/2017   Past Surgical History:  Procedure Laterality Date  . CARDIAC DEFIBRILLATOR PLACEMENT  2015  . CARDIOVERSION N/A 10/10/2016   Procedure: CARDIOVERSION;  Surgeon: Dorothy Spark, MD;  Location: [facility] ENDOSCOPY;  Service: Cardiovascular;  Laterality: N/A;  . CARDIOVERSION N/A 03/27/2017   Procedure:  CARDIOVERSION;  Surgeon: Jerline Pain, MD;  Location: [facility];  Service: Cardiovascular;  Laterality: N/A;  . EYE SURGERY Left 1990  . IR PERCUTANEOUS ART THROMBECTOMY/INFUSION INTRACRANIAL INC DIAG ANGIO  09/05/2016  . IR RADIOLOGIST EVAL & MGMT  10/03/2016  . RADIOLOGY WITH ANESTHESIA N/A 09/05/2016   Procedure: RADIOLOGY WITH ANESTHESIA;  Surgeon: Luanne Bras, MD;  Location: [facility];  Service: Radiology;  Laterality: N/A;    Current Outpatient Medications  Medication Sig Dispense Refill  . apixaban (ELIQUIS) 5 MG TABS tablet Take 1 tablet (5 mg total) by mouth 2 (two) times daily. 60 tablet 6  . busPIRone (BUSPAR) 7.5 MG tablet Take 7.5 mg by mouth daily as needed for depression.    . carvedilol (COREG) 25 MG tablet Take 1 tablet (25 mg total) by mouth 2 (two) times daily with a meal. 60 tablet 6  . diclofenac sodium (VOLTAREN) 1 % GEL Apply to affected area as needed twice daily. 100 g 1  . diltiazem (CARDIZEM CD) 120 MG 24 hr capsule Take 1 capsule (120 mg total) by mouth daily. 30 capsule 6  . dofetilide (TIKOSYN) 500 MCG capsule Take 1 capsule (500 mcg total) by mouth 2 (two) times daily. 60 capsule 6  . DULoxetine (CYMBALTA) 30 MG capsule Take 1 tablet nightly for 1 week. If well tolerated, can increase to 2 tablets nightly. 60 capsule 0  . furosemide (LASIX) 40 MG tablet Take 1 tablet (40 mg total) by mouth daily. 30 tablet 6  . hydrALAZINE (APRESOLINE) 25 MG tablet Take 1 tablet (25 mg total) by mouth 2 (two) times daily. 60 tablet 11  . magnesium oxide (MAG-OX) 400 (241.3 Mg) MG tablet Take 1 tablet (400 mg total) by mouth 2 (two) times daily. 30 tablet 6  . potassium chloride SA (K-DUR,KLOR-CON) 20 MEQ tablet Take 1 tablet (20 mEq total) by mouth daily. 90 tablet 3   No current facility-administered medications for this encounter.     No Known Allergies  Social History   Socioeconomic History  . Marital status: Single    Spouse name: Not on file  . Number of  children: Not on file  . Years of education: 58 (some college)  . Highest education level: Not on file  Social Needs  . Financial resource strain: Not on file  . Food insecurity - worry: Not on file  . Food insecurity - inability: Not on file  . Transportation needs - medical: Not on file  . Transportation needs - non-medical: Not on file  Occupational History  . Occupation: disability  Tobacco Use  . Smoking status: Current Some Day Smoker    Packs/day: 0.50    Types: Cigarettes  . Smokeless tobacco: Never Used  . Tobacco comment: a pack will last a week  Substance and Sexual Activity  . Alcohol use: Yes    Alcohol/week: 1.8 oz    Types: 3 Cans of beer per week    Comment: weekends, a couple   . Drug use: Yes    Frequency: 2.0 times per week    Types: Marijuana  Comment: for pain and leisure   . Sexual activity: Yes    Partners: Female    Birth control/protection: Condom  Other Topics Concern  . Not on file  Social History Narrative  . Not on file    Family History  Problem Relation Age of Onset  . Stroke Maternal Aunt   . Heart disease Neg Hx     ROS- All systems are reviewed and negative except as per the HPI above  Physical Exam: Vitals:   04/04/17 1401  Pulse: (!) 53   Wt Readings from Last 3 Encounters:  04/01/17 195 lb 6.4 oz (88.6 kg)  03/28/17 185 lb 14.4 oz (84.3 kg)  03/25/17 191 lb (86.6 kg)    Labs: Lab Results  Component Value Date   NA 138 03/28/2017   K 4.1 03/28/2017   CL 102 03/28/2017   CO2 24 03/28/2017   GLUCOSE 96 03/28/2017   BUN 13 03/28/2017   CREATININE 1.19 03/28/2017   CALCIUM 9.1 03/28/2017   PHOS 2.7 09/07/2016   MG 1.8 03/28/2017   Lab Results  Component Value Date   INR 1.30 09/05/2016   Lab Results  Component Value Date   CHOL 96 09/06/2016   HDL 26 (L) 09/06/2016   LDLCALC 44 09/06/2016   TRIG 128 09/06/2016     GEN- The patient is well appearing, alert and oriented x 3 today.   Head- normocephalic,  atraumatic Eyes-  Sclera clear, conjunctiva pink Ears- hearing intact Oropharynx- clear Neck- supple, no JVP Lymph- no cervical lymphadenopathy Lungs- Clear to ausculation bilaterally, normal work of breathing Heart-regular rate and rhythm, no murmurs, rubs or gallops, PMI not laterally displaced GI- soft, NT, ND, + BS Extremities- no clubbing, cyanosis, or edema MS- no significant deformity or atrophy Skin- no rash or lesion Psych- euthymic mood, full affect Neuro- strength and sensation are intact  EKG- SR at sinus brady at 53 bpm, pr int 170 ms, qrs int 90 ms, qtc 454 ms (stable) Epic records reviewed Echo-Study Conclusions  - Left ventricle: The cavity size was normal. Systolic function was   moderately to severely reduced. The estimated ejection fraction   was in the range of 30% to 35%. Diffuse hypokinesis. Moderate   hypokinesis of the anteroseptal, inferior, and inferoseptal   myocardium. - Aortic valve: Transvalvular velocity was within the normal range.   There was no stenosis. There was moderate regurgitation. Valve   area (VTI): 2.25 cm^2. Valve area (Vmax): 2.37 cm^2. Valve area   (Vmean): 2.34 cm^2. - Mitral valve: Transvalvular velocity was within the normal range.   There was no evidence for stenosis. There was mild regurgitation. - Left atrium: The atrium was moderately dilated. - Right ventricle: The cavity size was normal. Wall thickness was   normal. Systolic function was mildly reduced. - Right atrium: The atrium was moderately dilated. - Tricuspid valve: There was mild-moderate regurgitation. - Pulmonary arteries: Systolic pressure was mildly increased. PA   peak pressure: 38 mm Hg (S).   Assessment and Plan: 1. Persistent afib  In SR on tikosyn at 500 mcg bid, qtc stable Tikosyn precautions reviewed Continue to take eliquis 5 mg bid No benadryl use Crcl cal at 68.95 Bmet/mag today Encouraged to speak to Dr. [name] re meds and ED on f/u  3/4  Butch Penny C. Philmore Lepore, [facility] [address] [place], [place] [address] [phone]

## 2017-04-06 ENCOUNTER — Encounter: Payer: Self-pay | Admitting: Internal Medicine

## 2017-04-06 DIAGNOSIS — M542 Cervicalgia: Secondary | ICD-10-CM | POA: Insufficient documentation

## 2017-04-06 NOTE — Assessment & Plan Note (Signed)
-   Chronic, worsening. Patient has been told he has DJD with sciatica in past but describes more MSK pain today. No focal deficits. - Recommended starting cymbalta 30 mg nightly, increasing to 60 mg after one week if tolerating well. Tylenol prn.  - Will avoid adding additional agents at this time due to difficulty tolerating muscle relaxants in past and risk of benzodiazepine outweighs benefit given patient's age. - Will order PT - Asked patient to find out where he had neck imaging in the past to obtain records - Recommended getting a supportive neck pillow to avoid neck extension while sleeping

## 2017-04-15 ENCOUNTER — Ambulatory Visit (INDEPENDENT_AMBULATORY_CARE_PROVIDER_SITE_OTHER): Payer: Medicare Other | Admitting: Internal Medicine

## 2017-04-15 ENCOUNTER — Other Ambulatory Visit: Payer: Self-pay

## 2017-04-15 ENCOUNTER — Encounter: Payer: Self-pay | Admitting: Internal Medicine

## 2017-04-15 VITALS — BP 102/62 | HR 64 | Temp 97.6°F | Ht 67.0 in | Wt 187.0 lb

## 2017-04-15 DIAGNOSIS — M542 Cervicalgia: Secondary | ICD-10-CM | POA: Diagnosis present

## 2017-04-15 DIAGNOSIS — I255 Ischemic cardiomyopathy: Secondary | ICD-10-CM

## 2017-04-15 MED ORDER — DIAZEPAM 2 MG PO TABS: 2.0000 mg | ORAL_TABLET | Freq: Every evening | ORAL | 0 refills | Status: DC | PRN

## 2017-04-15 MED ORDER — DULOXETINE HCL 60 MG PO CPEP: ORAL_CAPSULE | ORAL | 0 refills | Status: DC

## 2017-04-15 NOTE — Progress Notes (Signed)
[name] Progress Note  Subjective:  Glenn [name] is a 67 y.o. with history of afib now on tikosyn, stroke, NICM w/ICD, HFrEF (30-35% with diffuse hypokinesis), and tobacco abuse who presents for ongoing upper back and neck pain. Patient has not noticed an improvement in neck/upper back pain since starting cymbalta 30 mg earlier this month. He said he tried taking 60 mg but thought pain may have gotten worse so stopped. Denies feeling sedated which had been an issue with other medications (tramadol, flexeril, loratab, gabapentin). He has not heard from PT yet about scheduling an appointment. He has not gotten a new pillow as suggested at last visit. He reports pain has been so bad over last couple of days that he is considering going to the emergency room after [facility] appointment. He reports increased feeling of tightness of right neck with decreased mobility and pain of upper back. He denies recent radiation of pain or numbness/tingling into arms or hands. He cannot take NSAIDs on tikosyn, says tylenol does not help, and did not have any improvement from voltaren gel.  ROS: No rashes, no fever  No Known Allergies  Social History   Tobacco Use  . Smoking status: Current Some Day Smoker    Packs/day: 0.50    Types: Cigarettes  . Smokeless tobacco: Never Used  . Tobacco comment: a pack will last a week  Substance Use Topics  . Alcohol use: Yes    Alcohol/week: 1.8 oz    Types: 3 Cans of beer per week    Comment: weekends, a couple     Objective: Blood pressure 102/62, pulse 64, temperature 97.6 F (36.4 C), temperature source Oral, height 5\' 7"  (1.702 m), weight 187 lb (84.8 kg), SpO2 92 %. Body mass index is 29.29 kg/m. Constitutional: Overweight male, pleasant, in NAD HENT: MMM, no cervical lymphadenopathy Cardiovascular: RRR, S1, S2, no m/r/g.  Pulmonary/Chest: Effort normal and breath sounds normal.  Musculoskeletal: Neck range of motion limited to turning  about 30 degrees to right. Discomfort with extension. No midline spinal TTP. Increased muscle tension over upper trapezius muscles.  Neurological: AOx3, no focal deficits. Ulnar and radial nerve testing normal. Peripheral sensation of UEs intact.  Skin: Skin is warm and dry. No rash noted.  Psychiatric: Normal mood and affect.  Vitals reviewed  Assessment/Plan: Neck pain - Stable but significantly impacting patient's sleep and neck mobility. No radiculopathy symptoms at today's visit. Have tried multiple pain relief agents without relief. Did report improvement of muscle tightness and improvement in sleep with xanax in past. - Discussed that benzodiazepines would not be a long-term treatment option but could be used intermittently for severe symptoms. Reiterated need for multi-pronged approach (PT, medication for nerve pain, getting outside records) - Prescribed valium 2 mg, #10 pills, with instructions to take at bedtime as needed. - Recommended restarting cymbalta 60 mg daily as did not cause drowsiness. - Provided phone number for [facility] as referral appears to have been approved. - Recommended buying cervical pillow that would limit neck movement at night - Faxed ROI for past neck imaging results from patient's last PCP in [place] (Dr. Coralee North at fax # [phone])  Follow-up in about 2-3 weeks to re-assess pain.  Olene Floss, MD [place], PGY-3

## 2017-04-15 NOTE — Patient Instructions (Addendum)
Mr. Ligman,  Please take cymbalta 60 mg nightly.  You can also take valium 2 mg for severe muscle spasm. Do not drive with this. This is not a long-term medication.  Please get a sleep pillow that is contoured not to let your neck move during sleep. Call outpatient rehab at [phone] for PT appointment.  See me back in 2 weeks to see how things are going.  Best, Dr. Ola Spurr.

## 2017-04-18 ENCOUNTER — Encounter: Payer: Self-pay | Admitting: Internal Medicine

## 2017-04-18 NOTE — Assessment & Plan Note (Signed)
-   Stable but significantly impacting patient's sleep and neck mobility. No radiculopathy symptoms at today's visit. Have tried multiple pain relief agents without relief. Did report improvement of muscle tightness and improvement in sleep with xanax in past. - Discussed that benzodiazepines would not be a long-term treatment option but could be used intermittently for severe symptoms. Reiterated need for multi-pronged approach (PT, medication for nerve pain, getting outside records) - Prescribed valium 2 mg, #10 pills, with instructions to take at bedtime as needed. - Recommended restarting cymbalta 60 mg daily as did not cause drowsiness. - Provided phone number for [facility] as referral appears to have been approved. - Recommended buying cervical pillow that would limit neck movement at night - Faxed ROI for past neck imaging results from patient's last PCP in [place] (Dr. Coralee North at fax # [phone])

## 2017-04-22 ENCOUNTER — Ambulatory Visit (INDEPENDENT_AMBULATORY_CARE_PROVIDER_SITE_OTHER): Payer: Medicare Other | Admitting: Internal Medicine

## 2017-04-22 ENCOUNTER — Ambulatory Visit: Payer: Medicare Other

## 2017-04-22 ENCOUNTER — Encounter: Payer: Self-pay | Admitting: Internal Medicine

## 2017-04-22 VITALS — BP 128/62 | HR 66 | Ht 67.0 in | Wt 185.0 lb

## 2017-04-22 DIAGNOSIS — Z95810 Presence of automatic (implantable) cardiac defibrillator: Secondary | ICD-10-CM | POA: Diagnosis not present

## 2017-04-22 DIAGNOSIS — I481 Persistent atrial fibrillation: Secondary | ICD-10-CM

## 2017-04-22 DIAGNOSIS — I5022 Chronic systolic (congestive) heart failure: Secondary | ICD-10-CM | POA: Diagnosis not present

## 2017-04-22 DIAGNOSIS — I255 Ischemic cardiomyopathy: Secondary | ICD-10-CM

## 2017-04-22 DIAGNOSIS — I4819 Other persistent atrial fibrillation: Secondary | ICD-10-CM

## 2017-04-22 LAB — BASIC METABOLIC PANEL
BUN / CREAT RATIO: 7 — AB (ref 10–24)
BUN: 8 mg/dL (ref 8–27)
CO2: 23 mmol/L (ref 20–29)
CREATININE: 1.15 mg/dL (ref 0.76–1.27)
Calcium: 9.8 mg/dL (ref 8.6–10.2)
Chloride: 100 mmol/L (ref 96–106)
GFR, EST AFRICAN AMERICAN: 77 mL/min/{1.73_m2} (ref >59)
GFR, EST NON AFRICAN AMERICAN: 66 mL/min/{1.73_m2} (ref >59)
Glucose: 107 mg/dL — ABNORMAL HIGH (ref 65–99)
POTASSIUM: 4.5 mmol/L (ref 3.5–5.2)
SODIUM: 142 mmol/L (ref 134–144)

## 2017-04-22 MED ORDER — MAGNESIUM OXIDE 400 (241.3 MG) MG PO TABS: 400.0000 mg | ORAL_TABLET | Freq: Two times a day (BID) | ORAL | 6 refills | Status: DC

## 2017-04-22 NOTE — Progress Notes (Signed)
Patient Care Team: Rogue Bussing, MD as PCP - General (Family Medicine) [name], MD as PCP - Cardiology (Cardiology)   HPI  Glenn [name] is a 67 y.o. male Seen in follow-up for an ICD.  He has a nonischemic cardiomyopathy.  He has a history of atrial fibrillation and interval stroke.  He was treated with chemical thrombectomy.  He is on Eliquis.  He also takes aspirin.??.  Echocardiogram EF 30-35%  He underwent cardioversion 8/18.  He was seen by mL-PA 1/19.  He was noted to be in recurrent atrial fibrillation and was hospitalized 2/19 for dofetilide loading.  He has been holding sinus rhythm.  He feels considerably better.  He has noted challenges with somnolence.  This since the initiation of the dofetilide.  He is also had problems with sexual function since the initiation of medications last summer.  Records and Results Reviewed   Past Medical History:  Diagnosis Date  . Atrial fibrillation ([place])   . CHF (congestive heart failure) ([place])   . Hepatitis C   . Hypertension   . [place] ([place])   . Visit for monitoring Tikosyn therapy 03/26/2017    Past Surgical History:  Procedure Laterality Date  . CARDIAC DEFIBRILLATOR PLACEMENT  2015  . CARDIOVERSION N/A 10/10/2016   Procedure: CARDIOVERSION;  Surgeon: Dorothy Spark, MD;  Location[facility] ENDOSCOPY;  Service: Cardiovascular;  Laterality: N/A;  . CARDIOVERSION N/A 03/27/2017   Procedure: CARDIOVERSION;  Surgeon: Jerline Pain, MD;  Location: [place];  Service: Cardiovascular;  Laterality: N/A;  . EYE SURGERY Left 1990  . IR PERCUTANEOUS ART THROMBECTOMY/INFUSION INTRACRANIAL INC DIAG ANGIO  09/05/2016  . IR RADIOLOGIST EVAL & MGMT  10/03/2016  . RADIOLOGY WITH ANESTHESIA N/A 09/05/2016   Procedure: RADIOLOGY WITH ANESTHESIA;  Surgeon: Luanne Bras, MD;  Location: [facility];  Service: Radiology;  Laterality: N/A;    Current Outpatient Medications  Medication Sig Dispense Refill  .  apixaban (ELIQUIS) 5 MG TABS tablet Take 1 tablet (5 mg total) by mouth 2 (two) times daily. 60 tablet 6  . busPIRone (BUSPAR) 7.5 MG tablet Take 7.5 mg by mouth daily as needed for depression.    . carvedilol (COREG) 25 MG tablet Take 1 tablet (25 mg total) by mouth 2 (two) times daily with a meal. 60 tablet 6  . diazepam (VALIUM) 2 MG tablet Take 1 tablet (2 mg total) by mouth at bedtime as needed. 10 tablet 0  . diclofenac sodium (VOLTAREN) 1 % GEL Apply to affected area as needed twice daily. 100 g 1  . dofetilide (TIKOSYN) 500 MCG capsule Take 1 capsule (500 mcg total) by mouth 2 (two) times daily. 60 capsule 6  . DULoxetine (CYMBALTA) 60 MG capsule Take 1 tablet nightly for 1 week. If well tolerated, can increase to 2 tablets nightly. 30 capsule 0  . furosemide (LASIX) 40 MG tablet Take 1 tablet (40 mg total) by mouth daily. 30 tablet 6  . hydrALAZINE (APRESOLINE) 25 MG tablet Take 1 tablet (25 mg total) by mouth 2 (two) times daily. 60 tablet 11  . magnesium oxide (MAG-OX) 400 (241.3 Mg) MG tablet Take 1 tablet (400 mg total) by mouth 2 (two) times daily. 30 tablet 6  . potassium chloride SA (K-DUR,KLOR-CON) 20 MEQ tablet Take 1 tablet (20 mEq total) by mouth daily. 90 tablet 3  . diltiazem (CARDIZEM CD) 120 MG 24 hr capsule Take 1 capsule (120 mg total) by mouth daily. 30 capsule  6   No current facility-administered medications for this visit.     No Known Allergies    Review of Systems negative except from HPI and PMH  Physical Exam BP 128/62   Pulse 66   Ht 5\' 7"  (1.702 m)   Wt 185 lb (83.9 kg)   SpO2 97%   BMI 28.98 kg/m  Well developed and well nourished in no acute distress HENT normal E scleral and icterus clear Neck Supple JVP flat; carotids brisk and full Clear to ausculation  Regular rate and rhythm, no murmurs gallops or rub Soft with active bowel sounds No clubbing cyanosis  Edema Alert and oriented, grossly normal motor and sensory function Skin Warm and  Dry  ECG Sinus 52  17/10/49  Assessment and  Plan  Sinus bradycardia  Cardiomyopathy presumed nonischemic question rate related  Atrial fibrillation-long-term persistent  Congestive heart failure-chronic class IIb  Implantable defibrillator-[employer]  Stroke-recent  Hypersomnolence  Hypertension/hypertensive heart disease   Review of side effects of dofetilide revealed hypersomnolence can be seen particularly in the first month.  I have encouraged him to push on.  He appreciates sinus rhythm.  At repeat, if he remains in sinus rhythm, would undertake an echocardiogram to reassess LV function  He needs a metabolic profile to reassess electrolytes following the initiation of dofetilide.  We will obtain that today.  Blood pressure is reasonably controlled.  Bradycardia is an issue; this potentially can be obviated by the discontinuation of his diltiazem.  Diltiazem can also be contributing to his fatigue.  Now that he is holding sinus rhythm hopefully he will not need the added meds for rate control    Current medicines are reviewed at length with the patient today .  The patient does not  have concerns regarding medicines.

## 2017-04-22 NOTE — Patient Instructions (Addendum)
Medication Instructions:  Your physician has recommended you make the following change in your medication: STOP CARDIZEM (Diltiazem)   Labwork: You will have a BMP and drawn today  Testing/Procedures: None ordered.   Follow-Up: Your physician recommends that you schedule a follow-up appointment in: 3 MONTHS with Doristine Devoid, NP  Remote monitoring is used to monitor your ICD from home. This monitoring reduces the number of office visits required to check your device to one time per year. It allows [place] to keep an eye on the functioning of your device to ensure it is working properly. You are scheduled for a device check from home on 06/24/2017. You may send your transmission at any time that day. If you have a wireless device, the transmission will be sent automatically. After your physician reviews your transmission, you will receive a postcard with your next transmission date.  Your physician wants you to follow-up in: 1 YEAR with Dr. [name]. You will receive a reminder letter in the mail two months in advance. If you don't receive a letter, please call our office to schedule the follow-up appointment.     Any Other Special Instructions Will Be Listed Below (If Applicable).     If you need a refill on your cardiac medications before your next appointment, please call your pharmacy.

## 2017-04-25 LAB — CUP PACEART INCLINIC DEVICE CHECK
HIGH POWER IMPEDANCE MEASURED VALUE: 47 Ohm
HighPow Impedance: 45 Ohm
Implantable Lead Location: 753860
Implantable Lead Model: 295
Implantable Lead Serial Number: 135220
Lead Channel Impedance Value: 826 Ohm
Lead Channel Pacing Threshold Amplitude: 0.8 V
Lead Channel Setting Pacing Amplitude: 2.5 V
Lead Channel Setting Sensing Sensitivity: 0.5 mV
MDC IDC LEAD IMPLANT DT: 20151113
MDC IDC MSMT LEADCHNL RV PACING THRESHOLD PULSEWIDTH: 0.5 ms
MDC IDC MSMT LEADCHNL RV SENSING INTR AMPL: 25 mV — AB
MDC IDC PG IMPLANT DT: 20151113
MDC IDC SESS DTM: 20190304050000
MDC IDC SET LEADCHNL RV PACING PULSEWIDTH: 0.5 ms
Pulse Gen Serial Number: 104417

## 2017-04-30 ENCOUNTER — Encounter: Payer: Self-pay | Admitting: Physical Therapy

## 2017-04-30 ENCOUNTER — Ambulatory Visit: Payer: Medicare Other | Attending: Family Medicine | Admitting: Physical Therapy

## 2017-04-30 DIAGNOSIS — M542 Cervicalgia: Secondary | ICD-10-CM | POA: Diagnosis not present

## 2017-04-30 DIAGNOSIS — M25612 Stiffness of left shoulder, not elsewhere classified: Secondary | ICD-10-CM | POA: Diagnosis not present

## 2017-04-30 DIAGNOSIS — M6281 Muscle weakness (generalized): Secondary | ICD-10-CM | POA: Diagnosis not present

## 2017-04-30 NOTE — Therapy (Signed)
[place], [place], [address] Phone: [phone]   Fax:  [phone]  Physical Therapy Evaluation  Patient Details  Name: Glenn [name] MRN: [id number] Date of Birth: 1951-01-11 Referring Provider: Rogue Bussing, MD    Encounter Date: 04/30/2017  PT End of Session - 04/30/17 1218    Visit Number  1    Number of Visits  12    Date for PT Re-Evaluation  06/14/17    PT Start Time  1115    PT Stop Time  1210    PT Time Calculation (min)  55 min    Activity Tolerance  Patient tolerated treatment well    Behavior During Therapy  [facility] for tasks assessed/performed       Past Medical History:  Diagnosis Date  . Atrial fibrillation ([place])   . CHF (congestive heart failure) ([place])   . Hepatitis C   . Hypertension   . Stroke ([place])   . Visit for monitoring Tikosyn therapy 03/26/2017    Past Surgical History:  Procedure Laterality Date  . CARDIAC DEFIBRILLATOR PLACEMENT  2015  . CARDIOVERSION N/A 10/10/2016   Procedure: CARDIOVERSION;  Surgeon: Dorothy Spark, MD;  Location: [facility] ENDOSCOPY;  Service: Cardiovascular;  Laterality: N/A;  . CARDIOVERSION N/A 03/27/2017   Procedure: CARDIOVERSION;  Surgeon: Jerline Pain, MD;  Location: [place];  Service: Cardiovascular;  Laterality: N/A;  . EYE SURGERY Left 1990  . IR PERCUTANEOUS ART THROMBECTOMY/INFUSION INTRACRANIAL INC DIAG ANGIO  09/05/2016  . IR RADIOLOGIST EVAL & MGMT  10/03/2016  . RADIOLOGY WITH ANESTHESIA N/A 09/05/2016   Procedure: RADIOLOGY WITH ANESTHESIA;  Surgeon: Luanne Bras, MD;  Location: [place];  Service: Radiology;  Laterality: N/A;    There were no vitals filed for this visit.   Subjective Assessment - 04/30/17 1117    Subjective  Pt presents with neck pain which began a few yrs ago, "off and on" for many years.  A few weeks ago he went ot the ED because the pain was referred up into his face and head.  Reports is was  so sensitive he couldnt touch the sheet to his face.  Endorses radiation of pain into each hand/arm, tingling.   Feels weak when he is hurting.  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    He and his son live together, does not drive.     Pertinent History  embolic CVA, 0/9470.   A fib with defibrillator     Limitations  Sitting;Reading;Lifting;House hold activities;Other (comment)    How long can you sit comfortably?  works on good posture and it helps prevent his pain     How long can you walk comfortably?  does not worsen pain     Diagnostic tests  none     Patient Stated Goals  Pt would like to be pain free.     Currently in Pain?  Yes    Pain Score  7     Pain Location  Neck    Pain Orientation  Left;Lateral;Posterior    Pain Descriptors / Indicators  Tightness    Pain Type  Chronic pain    Pain Radiating Towards  L arm and hand     Pain Onset  More than a month ago    Pain Frequency  Intermittent    Aggravating Factors   really can't tell but increase with movement    Pain Relieving Factors  resting, hot pack  Effect of Pain on Daily Activities  limits work, recreation          [facility] PT Assessment - 04/30/17 0001      Assessment   Medical Diagnosis  neck pain     Referring Provider  Rogue Bussing, MD     Onset Date/Surgical Date  -- chronic     Hand Dominance  Right    Prior Therapy  No       Precautions   Precautions  ICD/Pacemaker    Precaution Comments  defib L UE       Restrictions   Weight Bearing Restrictions  No      Balance Screen   Has the patient fallen in the past 6 months  No    Has the patient had a decrease in activity level because of a fear of falling?   No    Is the patient reluctant to leave their home because of a fear of falling?   No      Home Environment   Living Environment  Private residence    Living Arrangements  Children    Type of [place]      Prior Function   Level of Independence  Independent     Vocation  Part time employment    Network engineer    Leisure  chess, music, pool       Cognition   Overall Cognitive Status  Within Functional Limits for tasks assessed      Observation/Other Assessments   Focus on Therapeutic Outcomes (FOTO)   63%      Sensation   Light Touch  Appears Intact    Additional Comments  Rt. arm tingling a bit on eval       Posture/Postural Control   Posture/Postural Control  Postural limitations    Posture Comments  can correct and sit with good posture, mild forward head, L shoulder hiked viewed posteriorly      AROM   Overall AROM Comments  limited in L AROM flexion, abduction about 30-40 deg in standing, painful, stiff     Cervical Flexion  25    Cervical Extension  30    Cervical - Right Side Bend  30    Cervical - Left Side Bend  20    Cervical - Right Rotation  50    Cervical - Left Rotation  40       PROM   Overall PROM Comments  end range pain L shoulder all planes       Strength   Right Shoulder Flexion  5/5    Right Shoulder ABduction  5/5    Left Shoulder Flexion  4/5 pain    Left Shoulder ABduction  4/5 pain       Palpation   Spinal mobility  hypomobile, spasm, difficulty relaxing     Palpation comment  pain L posterior cervicals, into L upper trap       Special Tests    Special Tests  Cervical    Cervical Tests  Dictraction      Distraction Test   Findngs  Positive    side  Left    Comment  relief in supine       Transfers   Transfer Cueing  needs verbal cues for avoiding neck stress, tension              Objective measurements completed on examination: See above findings.      [facility] Adult  PT Treatment/Exercise - 04/30/17 0001      Manual Therapy   Passive ROM  in supine     Manual Traction  gentle, in neutral and slight Rt. lateral flexion             PT Education - 04/30/17 1218    Education provided  Yes    Education Details  PT/POC, MHP, HEP, posture, traction     Person(s)  Educated  Patient    Methods  Explanation;Demonstration;Verbal cues;Handout    Comprehension  Verbalized understanding;Returned demonstration;Need further instruction          PT Long Term Goals - 04/30/17 1219      PT LONG TERM GOAL #1   Title  Pt will be able to improve FOTO score to 40% or better to demo functional improvement.     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     Time  6    Period  Weeks    Status  New    Target Date  06/14/17      PT LONG TERM GOAL #3   Title  Patient will demo pain free AROM in cervical spine for comfort with reading and social interaction    Time  6    Period  Weeks    Status  New    Target Date  06/14/17      PT LONG TERM GOAL #4   Title  Pt will be I with cervical HEP, posture , UE strength     Time  6    Period  Weeks    Status  New    Target Date  06/14/17             Plan - 04/30/17 1228    Clinical Impression Statement  Pt presents for eval of chronic worsening neck pain which seems to have a radicular component to it.  He did report relief with manual traction.  He has significant shoulder stiffness, decreased cervical ROM and diminished strength on L side.  He may have experienced severe trigger point which could explain the facial pain.  Will monitor and progress to reduce symptoms.     History and Personal Factors relevant to plan of care:  CVA, A -Fibrillation    Clinical Presentation  Evolving    Clinical Presentation due to:  changing symptoms, progressive weakening     Clinical Decision Making  Moderate    Rehab Potential  Excellent    PT Frequency  2x / week    PT Duration  6 weeks    PT Treatment/Interventions  ADLs/Self Care Home Management;Cryotherapy;Traction;Ultrasound;Moist Heat;Iontophoresis 4mg /ml Dexamethasone;Functional mobility  training;Neuromuscular re-education;Therapeutic activities;Patient/family education;Therapeutic exercise;Dry needling;Passive range of motion;Manual techniques;Taping    PT Next Visit Plan  check HEP, consider traction     PT Home Exercise Plan  chin tuck, scap squeeze, gentle rotation     Consulted and Agree with Plan of Care  Patient       Patient will benefit from skilled therapeutic intervention in order to improve the following deficits and impairments:  Decreased mobility, Hypomobility, Increased muscle spasms, Improper body mechanics, Pain, Postural dysfunction, Impaired UE functional use, Impaired flexibility, Increased fascial restricitons, Decreased strength, Decreased range of motion  Visit Diagnosis: Cervicalgia  Stiffness of left shoulder, not elsewhere classified  Muscle weakness (generalized)     Problem List Patient Active Problem List   Diagnosis Date Noted  . Neck pain 04/06/2017  . Paroxysmal atrial fibrillation ([name])   . Visit for monitoring Tikosyn therapy 03/25/2017  . ICD (implantable cardioverter-defibrillator) in place 09/13/2016  . Chest pain 09/13/2016  . Tobacco abuse 09/13/2016  . Hospital discharge follow-up 09/13/2016  . Upper back pain 09/13/2016  . Housing problems 09/13/2016  . History of hepatitis C 09/13/2016  . Persistent atrial fibrillation ([place])   . Atrial fibrillation with RVR ([place])   . Ischemic cardiomyopathy   . Cerebral infarction ([place])   . Stroke (cerebrum) ([place]) 09/05/2016  . CHF (congestive heart failure) ([place]) 08/14/2016  . HTN (hypertension) 08/14/2016  . Hepatitis C 08/14/2016    Philicia Heyne 04/30/2017, 12:41 PM  [facility] [address] [place], [place], [address] Phone: [phone]   Fax:  [phone]  Name: Orren Pietsch MRN: [id number] Date of Birth: Sep 16, 1950   Raeford Razor, PT 04/30/17 12:43 PM Phone: [phone] Fax: [phone]

## 2017-04-30 NOTE — Addendum Note (Signed)
Addended by: Raeford Razor L on: 04/30/2017 12:46 PM   Modules accepted: Orders

## 2017-05-01 ENCOUNTER — Ambulatory Visit (INDEPENDENT_AMBULATORY_CARE_PROVIDER_SITE_OTHER): Payer: Medicare Other | Admitting: Internal Medicine

## 2017-05-01 VITALS — BP 126/80 | HR 98 | Temp 97.5°F | Wt 181.4 lb

## 2017-05-01 DIAGNOSIS — M549 Dorsalgia, unspecified: Secondary | ICD-10-CM

## 2017-05-01 DIAGNOSIS — I255 Ischemic cardiomyopathy: Secondary | ICD-10-CM

## 2017-05-01 DIAGNOSIS — M542 Cervicalgia: Secondary | ICD-10-CM

## 2017-05-01 DIAGNOSIS — Z8619 Personal history of other infectious and parasitic diseases: Secondary | ICD-10-CM | POA: Diagnosis not present

## 2017-05-01 DIAGNOSIS — K759 Inflammatory liver disease, unspecified: Secondary | ICD-10-CM

## 2017-05-01 MED ORDER — DIAZEPAM 2 MG PO TABS
2.0000 mg | ORAL_TABLET | Freq: Every evening | ORAL | 0 refills | Status: DC | PRN
Start: 2017-05-01 — End: 2017-09-20

## 2017-05-01 NOTE — Patient Instructions (Signed)
Mr. Burleson,  Please continue physical therapy and as needed valium for spasm.  See me back in about a month or sooner as needed.   Alternatives include trying steroid for several days and referral to spine center.  I will send a letter with your lab results.  Best, Dr. Ola Spurr

## 2017-05-03 ENCOUNTER — Encounter: Payer: Self-pay | Admitting: Internal Medicine

## 2017-05-03 LAB — HCV RNA QUANT
HCV log10: 6.525 log10 IU/mL
Hepatitis C Quantitation: 3350000 IU/mL

## 2017-05-03 NOTE — Progress Notes (Signed)
[name] Progress Note  Subjective:  Glenn [name] is a 67 y.o. with history of afib now on tikosyn, stroke,NICM w/ICD, HFrEF (30-35% with diffuse hypokinesis), and tobacco abuse who presents for ongoing upper back and neck pain. He started PT yesterday and is hopeful about this working for him. Did not notice improvement on increased dose of cymbalta. Valium has helped with spasm and sleep when pain has been worst. Thinks pain has improved somewhat. Initially he had felt more tightness on left but now right bothering him more. Has not had recent radiation of pain down his arms. ROS: NO rash, no weakness  Hx Hep C: - Does not think he had complete treatment in [place] and interested in ID referral  HM: Thinks had TDAP in [place] 4 or 5 years ago.  No Known Allergies  Social History   Tobacco Use  . Smoking status: Current Some Day Smoker    Packs/day: 0.50    Types: Cigarettes  . Smokeless tobacco: Never Used  . Tobacco comment: a pack will last a week  Substance Use Topics  . Alcohol use: Yes    Alcohol/week: 1.8 oz    Types: 3 Cans of beer per week    Comment: weekends, a couple     Objective: Blood pressure 126/80, pulse 98, temperature (!) 97.5 F (36.4 C), temperature source Oral, weight 181 lb 6.4 oz (82.3 kg), SpO2 98 %. Body mass index is 28.41 kg/m. Constitutional: Pleasant older male in NAD Cardiovascular: RRR, S1, S2, no m/r/g.  Pulmonary/Chest: Effort normal and breath sounds normal.  Musculoskeletal: Increased muscle tension over trapezius bilaterally, R>L. With much effort patient able to laterally rotate neck about 45 degrees bilaterally. Can touch chin to chest but reports discomfort. Minimal neck extension.  Neurological: UE strength 5/5. Grip strength 5/5. Peripheral sensation intact.  Skin: Skin is warm and dry. No rash noted.  Psychiatric: Normal mood and affect.  Vitals reviewed  Reports having MRI of neck previously performed when living  in [place], [place]. Have requested records but none received yet.   Assessment/Plan: Upper back pain - Suspect muscle strain secondary to DJD of cervical spine with nerve irritation. Improved somewhat with physical therapy and prn valium (had tried opioids, gabapentin, flexeril in past without relief and with side effects of sedation).  - Recommended continued therapy, and patient planning to go twice weekly. - Provided refill of prn valium and discussed not taking with alcohol  - Discussed alternatives of trying steroid injection vs PO course for several days to help with inflammation and referral to spine center. Patient would like to hold off for now.   History of hepatitis C - Will check viral quant to help determine whether patient was completely treated or not - Will refer to ID if virus present  Follow-up next month to reassess back pain.  Olene Floss, MD [place], PGY-3

## 2017-05-04 ENCOUNTER — Encounter: Payer: Self-pay | Admitting: Internal Medicine

## 2017-05-04 NOTE — Assessment & Plan Note (Signed)
-   Suspect muscle strain secondary to DJD of cervical spine with nerve irritation. Improved somewhat with physical therapy and prn valium (had tried opioids, gabapentin, flexeril in past without relief and with side effects of sedation).  - Recommended continued therapy, and patient planning to go twice weekly. - Provided refill of prn valium and discussed not taking with alcohol  - Discussed alternatives of trying steroid injection vs PO course for several days to help with inflammation and referral to spine center. Patient would like to hold off for now.

## 2017-05-04 NOTE — Assessment & Plan Note (Signed)
-   Will check viral quant to help determine whether patient was completely treated or not - Will refer to ID if virus present

## 2017-05-06 ENCOUNTER — Ambulatory Visit: Payer: Medicare Other | Admitting: Physical Therapy

## 2017-05-08 ENCOUNTER — Ambulatory Visit: Payer: Medicare Other

## 2017-05-08 DIAGNOSIS — M542 Cervicalgia: Secondary | ICD-10-CM | POA: Diagnosis not present

## 2017-05-08 DIAGNOSIS — M25612 Stiffness of left shoulder, not elsewhere classified: Secondary | ICD-10-CM | POA: Diagnosis not present

## 2017-05-08 DIAGNOSIS — M6281 Muscle weakness (generalized): Secondary | ICD-10-CM

## 2017-05-08 NOTE — Therapy (Signed)
[place], [place], [address] Phone: [phone]   Fax:  [phone]  Physical Therapy Treatment  Patient Details  Name: Glenn [name] MRN: [id number] Date of Birth: 01/27/1951 Referring Provider: Rogue Bussing, MD    Encounter Date: 05/08/2017  PT End of Session - 05/08/17 1034    Visit Number  2    Number of Visits  12    Date for PT Re-Evaluation  06/14/17    PT Start Time  1100    PT Stop Time  1200    PT Time Calculation (min)  60 min    Activity Tolerance  Patient tolerated treatment well    Behavior During Therapy  [facility] for tasks assessed/performed       Past Medical History:  Diagnosis Date  . Atrial fibrillation ([place])   . CHF (congestive heart failure) ([place])   . Hepatitis C   . Hypertension   . Stroke ([place])   . Visit for monitoring Tikosyn therapy 03/26/2017    Past Surgical History:  Procedure Laterality Date  . CARDIAC DEFIBRILLATOR PLACEMENT  2015  . CARDIOVERSION N/A 10/10/2016   Procedure: CARDIOVERSION;  Surgeon: Dorothy Spark, MD;  Location: [facility] ENDOSCOPY;  Service: Cardiovascular;  Laterality: N/A;  . CARDIOVERSION N/A 03/27/2017   Procedure: CARDIOVERSION;  Surgeon: Jerline Pain, MD;  Location: [place];  Service: Cardiovascular;  Laterality: N/A;  . EYE SURGERY Left 1990  . IR PERCUTANEOUS ART THROMBECTOMY/INFUSION INTRACRANIAL INC DIAG ANGIO  09/05/2016  . IR RADIOLOGIST EVAL & MGMT  10/03/2016  . RADIOLOGY WITH ANESTHESIA N/A 09/05/2016   Procedure: RADIOLOGY WITH ANESTHESIA;  Surgeon: Luanne Bras, MD;  Location: [place];  Service: Radiology;  Laterality: N/A;    There were no vitals filed for this visit.  Subjective Assessment - 05/08/17 1101    Subjective  Neck pain same. LT side neck pain 8/10 with turning to RT acromium    Pain Score  8     Pain Location  Neck    Pain Orientation  Left    Pain Descriptors / Indicators  Aching;Tightness     Pain Type  Chronic pain    Pain Onset  More than a month ago    Pain Frequency  Constant last no pain     Aggravating Factors   turning head    Pain Relieving Factors  rest heat    Multiple Pain Sites  No                      [facility] Adult PT Treatment/Exercise - 05/08/17 0001      Modalities   Modalities  Ultrasound;Traction      Ultrasound   Ultrasound Location  LT neck     Ultrasound Parameters  100% 1MHZ 1.5 Wcm2    Ultrasound Goals  Pain      Traction   Type of Traction  Cervical    Min (lbs)  5    Max (lbs)  14    Hold Time  60    Rest Time  15    Time  15      Manual Therapy   Manual Therapy  Soft tissue mobilization    Soft tissue mobilization  with tool to LT neck and traps                  PT Long Term Goals - 04/30/17 1219      PT LONG TERM GOAL #  1   Title  Pt will be able to improve FOTO score to 40% or better to demo functional improvement.     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     Time  6    Period  Weeks    Status  New    Target Date  06/14/17      PT LONG TERM GOAL #3   Title  Patient will demo pain free AROM in cervical spine for comfort with reading and social interaction    Time  6    Period  Weeks    Status  New    Target Date  06/14/17      PT LONG TERM GOAL #4   Title  Pt will be I with cervical HEP, posture , UE strength     Time  6    Period  Weeks    Status  New    Target Date  06/14/17            Plan - 05/08/17 1035    Clinical Impression Statement  no change but tolerated treatment without incr pain . No probelms with traction so if returns Ok will incr pul to 16 pounds    PT Treatment/Interventions  ADLs/Self Care Home Management;Cryotherapy;Traction;Ultrasound;Moist Heat;Iontophoresis 4mg /ml Dexamethasone;Functional mobility  training;Neuromuscular re-education;Therapeutic activities;Patient/family education;Therapeutic exercise;Dry needling;Passive range of motion;Manual techniques;Taping    PT Next Visit Plan   consider traction if OK from today, Modalites , manual     PT Home Exercise Plan  chin tuck, scap squeeze, gentle rotation     Consulted and Agree with Plan of Care  Patient       Patient will benefit from skilled therapeutic intervention in order to improve the following deficits and impairments:  Decreased mobility, Hypomobility, Increased muscle spasms, Improper body mechanics, Pain, Postural dysfunction, Impaired UE functional use, Impaired flexibility, Increased fascial restricitons, Decreased strength, Decreased range of motion  Visit Diagnosis: Cervicalgia  Stiffness of left shoulder, not elsewhere classified  Muscle weakness (generalized)     Problem List Patient Active Problem List   Diagnosis Date Noted  . Neck pain 04/06/2017  . Paroxysmal atrial fibrillation ([name])   . Visit for monitoring Tikosyn therapy 03/25/2017  . ICD (implantable cardioverter-defibrillator) in place 09/13/2016  . Chest pain 09/13/2016  . Tobacco abuse 09/13/2016  . Hospital discharge follow-up 09/13/2016  . Upper back pain 09/13/2016  . Housing problems 09/13/2016  . History of hepatitis C 09/13/2016  . Persistent atrial fibrillation ([place])   . Atrial fibrillation with RVR ([place])   . Ischemic cardiomyopathy   . Cerebral infarction ([place])   . Stroke (cerebrum) ([place]) 09/05/2016  . CHF (congestive heart failure) ([place]) 08/14/2016  . HTN (hypertension) 08/14/2016  . Hepatitis C 08/14/2016    Darrel Hoover  PT 05/08/2017, 11:38 AM  [facility] [address] [place], [place], [address] Phone: [phone]   Fax:  [phone]  Name: Glenn [name] MRN: [id number] Date of Birth: [birth year]

## 2017-05-14 ENCOUNTER — Ambulatory Visit: Payer: Medicare Other

## 2017-05-14 ENCOUNTER — Ambulatory Visit: Payer: Medicaid Other | Admitting: Nurse Practitioner

## 2017-05-15 ENCOUNTER — Ambulatory Visit: Payer: Medicare Other | Admitting: Internal Medicine

## 2017-05-15 ENCOUNTER — Ambulatory Visit: Payer: Medicare Other | Admitting: Physical Therapy

## 2017-05-15 ENCOUNTER — Encounter: Payer: Self-pay | Admitting: Physical Therapy

## 2017-05-15 DIAGNOSIS — M25612 Stiffness of left shoulder, not elsewhere classified: Secondary | ICD-10-CM

## 2017-05-15 DIAGNOSIS — M6281 Muscle weakness (generalized): Secondary | ICD-10-CM

## 2017-05-15 DIAGNOSIS — M542 Cervicalgia: Secondary | ICD-10-CM | POA: Diagnosis not present

## 2017-05-15 NOTE — Therapy (Signed)
[place], [place], [address] Phone: [phone]   Fax:  [phone]  Physical Therapy Treatment  Patient Details  Name: Glenn [name] MRN: [id number] Date of Birth: April 09, 1950 Referring Provider: Rogue Bussing, MD    Encounter Date: 05/15/2017  PT End of Session - 05/15/17 1007    Visit Number  3    Number of Visits  12    Date for PT Re-Evaluation  06/14/17    PT Start Time  0925    PT Stop Time  1005    PT Time Calculation (min)  40 min    Activity Tolerance  Patient tolerated treatment well    Behavior During Therapy  [facility] for tasks assessed/performed       Past Medical History:  Diagnosis Date  . Atrial fibrillation ([place])   . CHF (congestive heart failure) ([place])   . Hepatitis C   . Hypertension   . Stroke ([place])   . Visit for monitoring Tikosyn therapy 03/26/2017    Past Surgical History:  Procedure Laterality Date  . CARDIAC DEFIBRILLATOR PLACEMENT  2015  . CARDIOVERSION N/A 10/10/2016   Procedure: CARDIOVERSION;  Surgeon: Dorothy Spark, MD;  Location: [facility];  Service: Cardiovascular;  Laterality: N/A;  . CARDIOVERSION N/A 03/27/2017   Procedure: CARDIOVERSION;  Surgeon: Jerline Pain, MD;  Location: [facility];  Service: Cardiovascular;  Laterality: N/A;  . EYE SURGERY Left 1990  . IR PERCUTANEOUS ART THROMBECTOMY/INFUSION INTRACRANIAL INC DIAG ANGIO  09/05/2016  . IR RADIOLOGIST EVAL & MGMT  10/03/2016  . RADIOLOGY WITH ANESTHESIA N/A 09/05/2016   Procedure: RADIOLOGY WITH ANESTHESIA;  Surgeon: Luanne Bras, MD;  Location: [place];  Service: Radiology;  Laterality: N/A;    There were no vitals filed for this visit.  Subjective Assessment - 05/15/17 0925    Subjective  Patient arrives today stating he is feeling better, he is less stiff and he is having less pain; he no longer has pain going up into his face     Pertinent History  embolic CVA, 06/3612.   A fib  with defibrillator     Currently in Pain?  Yes    Pain Score  4     Pain Location  Neck    Pain Orientation  Left;Right    Pain Descriptors / Indicators  Aching    Pain Type  Chronic pain    Pain Radiating Towards  down into R shoulder today     Pain Onset  More than a month ago    Pain Frequency  Constant    Aggravating Factors   to some extent, turning head     Pain Relieving Factors  exercises, heat, rest, good posture     Effect of Pain on Daily Activities  limits normal activities and mobility                 No data recorded       [facility] Adult PT Treatment/Exercise - 05/15/17 0001      Exercises   Exercises  Neck      Neck Exercises: Seated   Neck Retraction  15 reps    Shoulder Rolls  15 reps;Other (comment) up, back, down     Other Seated Exercise  scapular retractoins 1x15       Neck Exercises: Supine   Neck Retraction  10 reps;3 secs    Cervical Rotation  Right;Left;10 reps;Other (comment) as tolerated     Other Supine  Exercise  scapular retractions 1x10 cues for form     Other Supine Exercise  isometric cervical flexion, lateral flexion, felxion/extension 1x10 B       Modalities   Modalities  Moist Heat      Moist Heat Therapy   Number Minutes Moist Heat  8 Minutes not included in billing     Moist Heat Location  Cervical      Manual Therapy   Manual Therapy  Soft tissue mobilization    Manual therapy comments  performed separate from all other skilled services     Soft tissue mobilization  R upper trap and scalenes       Neck Exercises: Stretches   Upper Trapezius Stretch  2 reps;30 seconds;Right             PT Education - 05/15/17 1007    Education provided  Yes    Education Details  benefits of gentle exercise/activity as tolerated, possible benefits of DN     Person(s) Educated  Patient    Methods  Explanation    Comprehension  Verbalized understanding          PT Long Term Goals - 04/30/17 1219      PT LONG TERM GOAL  #1   Title  Pt will be able to improve FOTO score to 40% or better to demo functional improvement.     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     Time  6    Period  Weeks    Status  New    Target Date  06/14/17      PT LONG TERM GOAL #3   Title  Patient will demo pain free AROM in cervical spine for comfort with reading and social interaction    Time  6    Period  Weeks    Status  New    Target Date  06/14/17      PT LONG TERM GOAL #4   Title  Pt will be I with cervical HEP, posture , UE strength     Time  6    Period  Weeks    Status  New    Target Date  06/14/17            Plan - 05/15/17 1007    Clinical Impression Statement  Introduced gentle functional exercises for postural training and cervical mobility today due to patient reports of significant decreases in pain; cues and corrections provided for form as needed and appropriate. Education provided on appropriate pacing and necessity for working on mobility and motion as tolerated moving forward. Patient is generally improving and will continue to benefit from skilled PT services. Pain reduced to 0/10 following manual this session.     Rehab Potential  Excellent    PT Frequency  2x / week    PT Duration  6 weeks    PT Treatment/Interventions  ADLs/Self Care Home Management;Cryotherapy;Traction;Ultrasound;Moist Heat;Iontophoresis 4mg /ml Dexamethasone;Functional mobility training;Neuromuscular re-education;Therapeutic activities;Patient/family education;Therapeutic exercise;Dry needling;Passive range of motion;Manual techniques;Taping    PT Next Visit Plan  consider dry needling especially to scalenes if able/appropriate, continue exericse/ROM as tolerated, soft tissue interventions and stretching     PT Home Exercise Plan  chin tuck, scap  squeeze, gentle rotation      Consulted and Agree with Plan of Care  Patient       Patient will benefit from skilled therapeutic intervention in order to improve the following deficits and impairments:  Decreased mobility, Hypomobility, Increased muscle spasms, Improper body mechanics, Pain, Postural dysfunction, Impaired UE functional use, Impaired flexibility, Increased fascial restricitons, Decreased strength, Decreased range of motion  Visit Diagnosis: Cervicalgia  Stiffness of left shoulder, not elsewhere classified  Muscle weakness (generalized)     Problem List Patient Active Problem List   Diagnosis Date Noted  . Neck pain 04/06/2017  . Paroxysmal atrial fibrillation ([name])   . Visit for monitoring Tikosyn therapy 03/25/2017  . ICD (implantable cardioverter-defibrillator) in place 09/13/2016  . Chest pain 09/13/2016  . Tobacco abuse 09/13/2016  . Hospital discharge follow-up 09/13/2016  . Upper back pain 09/13/2016  . Housing problems 09/13/2016  . History of hepatitis C 09/13/2016  . Persistent atrial fibrillation ([place])   . Atrial fibrillation with RVR ([place])   . Ischemic cardiomyopathy   . Cerebral infarction ([place])   . Stroke (cerebrum) ([place]) 09/05/2016  . CHF (congestive heart failure) ([place]) 08/14/2016  . HTN (hypertension) 08/14/2016  . Hepatitis C 08/14/2016    Deniece Ree PT, DPT, CBIS  Supplemental Physical Therapist [place]   Pager [facility] [address] [place], [place], [address] Phone: [phone]   Fax:  [phone]  Name: Glenn [name] MRN: [id number] Date of Birth: September 30, 1950

## 2017-05-21 ENCOUNTER — Ambulatory Visit (INDEPENDENT_AMBULATORY_CARE_PROVIDER_SITE_OTHER): Payer: Medicaid Other | Admitting: Infectious Diseases

## 2017-05-21 ENCOUNTER — Encounter: Payer: Self-pay | Admitting: Infectious Diseases

## 2017-05-21 VITALS — BP 168/66 | HR 56 | Temp 97.9°F | Wt 187.0 lb

## 2017-05-21 DIAGNOSIS — B182 Chronic viral hepatitis C: Secondary | ICD-10-CM

## 2017-05-21 DIAGNOSIS — Z114 Encounter for screening for human immunodeficiency virus [HIV]: Secondary | ICD-10-CM | POA: Diagnosis not present

## 2017-05-21 NOTE — Patient Instructions (Signed)
Nice to meet you today!   We need to get a little more information about your hepatitis c infection before we start your treatment. I anticipate that we can get you started in a few weeks.   Until we can get you treated would recommend: - condoms with sexual encounters or abstinence - no sharing of razors, toothbrushes or anything that could potentially have blood on it.  - limit alcohol to as little as possible to less than 1 drink a day  - limit tylenol use to less than 2,000 mg daily   We will see you back in 2 weeks for a quick visit to review lab work and get you treated. You will meet with our other NP Marya Amsler and our pharmacy team again during this visit.   Would get your partner tested to ensure they don't have Hep C as well.

## 2017-05-21 NOTE — Progress Notes (Signed)
HPI: Glenn [name] is a 67 y.o. male is here today to see [place] for his hep C.   No results found for: HCVGENOTYPE, HEPCGENOTYPE  Allergies: No Known Allergies  Vitals: Temp: 97.9 F (36.6 C) (04/02 1353) Temp Source: Oral (04/02 1353) BP: 168/66 (04/02 1353) Pulse Rate: 56 (04/02 1353)  Past Medical History: Past Medical History:  Diagnosis Date  . Atrial fibrillation ([place])   . CHF (congestive heart failure) ([name])   . Hepatitis C   . Hypertension   . Stroke ([place])   . Visit for monitoring Tikosyn therapy 03/26/2017    Social History: Social History   Socioeconomic History  . Marital status: Single    Spouse name: Not on file  . Number of children: Not on file  . Years of education: 62 (some college)  . Highest education level: Not on file  Occupational History  . Occupation: disability  Social Needs  . Financial resource strain: Not on file  . Food insecurity:    Worry: Not on file    Inability: Not on file  . Transportation needs:    Medical: Not on file    Non-medical: Not on file  Tobacco Use  . Smoking status: Current Some Day Smoker    Packs/day: 0.50    Types: Cigarettes  . Smokeless tobacco: Never Used  . Tobacco comment: a pack will last a week  Substance and Sexual Activity  . Alcohol use: Yes    Alcohol/week: 1.8 oz    Types: 3 Cans of beer per week    Comment: weekends, a couple   . Drug use: Yes    Frequency: 2.0 times per week    Types: Marijuana    Comment: for pain and leisure   . Sexual activity: Yes    Partners: Female    Birth control/protection: Condom  Lifestyle  . Physical activity:    Days per week: Not on file    Minutes per session: Not on file  . Stress: Not on file  Relationships  . Social connections:    Talks on phone: Not on file    Gets together: Not on file    Attends religious service: Not on file    Active member of club or organization: Not on file    Attends meetings of clubs or organizations: Not on  file    Relationship status: Not on file  Other Topics Concern  . Not on file  Social History Narrative  . Not on file    Labs: No results found for: HIV1RNAQUANT, HIV1RNAVL, CD4TABS, HEPBSAB, HEPBSAG, HCVAB  No results found for: HCVGENOTYPE, HEPCGENOTYPE  No flowsheet data found.  AST (U/L)  Date Value  09/05/2016 43 (H)   ALT (U/L)  Date Value  09/05/2016 33   INR (no units)  Date Value  09/05/2016 1.30    CrCl: CrCl cannot be calculated (Patient's most recent lab result is older than the maximum 21 days allowed.).  Fibrosis Score: Pending  Child-Pugh Score: Prob class A  Previous Treatment Regimen: None  Assessment: Glenn [name] is here today for his initial visit for hep C. He has never been treated for it. We don't have any labs on it yet so we will collect them today.He does present some complication with therapy selection because he is on apixaban for his afib. This would be an issue with any of the NS5A drug due to pgp inhibition and potentially raised the level of apixaban. How much? We don't really know. Explained  to him that it would increase the risk for bleeding. We have 2 options to approach this. First, we can use one of the DAAs and monitor the bleeding sign closely or we could temporarily replace the apixaban with Lovenox until he is done with his hep C treatment. He agreed to the switch plan. We told him that we will reach out to his cardiologist to discuss the plan.   Recommendations:  Hep C labs today [place] with elastography Potential change apixaban to [place], Pharm.D., BCPS, AAHIVP Clinical Infectious [place] for Infectious Disease 05/21/2017, 9:58 PM

## 2017-05-21 NOTE — Progress Notes (Signed)
[place] for Infectious Disease   CC: consideration for treatment for chronic hepatitis C  HPI: Glenn [name] is a 67 y.o. male who presents for initial evaluation and management of chronic hepatitis C.  Patient tested positive with positive Ab and RNA in March 2019. Hepatitis C-associated risk factors present are: previous history of recreational IV drug use in 1980s. Patient has not had prior treatment for Hepatitis C. Patient does not have a past history of liver disease. Patient does not have a family history of liver disease. Patient does not have associated signs or symptoms related to liver disease.    Labs reviewed and confirm chronic hepatitis C with a positive viral load.   Records reviewed from March 2019   He has not been tested regarding Hep B or A immunity.   Review of Systems:  Review of Systems  Constitutional: Negative for chills and fever.  HENT: Negative for tinnitus.   Eyes: Negative for blurred vision and photophobia.  Respiratory: Negative for cough and sputum production.   Cardiovascular: Negative for chest pain.  Gastrointestinal: Negative for abdominal pain, diarrhea, nausea and vomiting.       Denies bleeding gums, blood per rectum or nose bleeds. No abdominal bloating or fullness  Genitourinary: Negative for dysuria.  Skin: Negative for rash.  Neurological: Negative for headaches.  Psychiatric/Behavioral: Negative for depression and substance abuse.     Past Medical History:  Diagnosis Date  . Atrial fibrillation ([place])   . CHF (congestive heart failure) ([place])   . Hepatitis C   . Hypertension   . Stroke ([place])   . Visit for monitoring Tikosyn therapy 03/26/2017   Outpatient Medications Prior to Visit  Medication Sig Dispense Refill  . busPIRone (BUSPAR) 7.5 MG tablet Take 7.5 mg by mouth daily as needed for depression.    . carvedilol (COREG) 25 MG tablet Take 1 tablet (25 mg total) by mouth 2 (two) times daily with a meal. 60 tablet 6    . diazepam (VALIUM) 2 MG tablet Take 1 tablet (2 mg total) by mouth at bedtime as needed. 30 tablet 0  . diclofenac sodium (VOLTAREN) 1 % GEL Apply to affected area as needed twice daily. 100 g 1  . dofetilide (TIKOSYN) 500 MCG capsule Take 1 capsule (500 mcg total) by mouth 2 (two) times daily. 60 capsule 6  . furosemide (LASIX) 40 MG tablet Take 1 tablet (40 mg total) by mouth daily. 30 tablet 6  . hydrALAZINE (APRESOLINE) 25 MG tablet Take 1 tablet (25 mg total) by mouth 2 (two) times daily. 60 tablet 11  . magnesium oxide (MAG-OX) 400 (241.3 Mg) MG tablet Take 1 tablet (400 mg total) by mouth 2 (two) times daily. 60 tablet 6  . potassium chloride SA (K-DUR,KLOR-CON) 20 MEQ tablet Take 1 tablet (20 mEq total) by mouth daily. 90 tablet 3  . apixaban (ELIQUIS) 5 MG TABS tablet Take 1 tablet (5 mg total) by mouth 2 (two) times daily. 60 tablet 6   No facility-administered medications prior to visit.     No Known Allergies  Social History   Tobacco Use  . Smoking status: Current Some Day Smoker    Packs/day: 0.50    Types: Cigarettes  . Smokeless tobacco: Never Used  . Tobacco comment: smoke 2 packs a week  Substance Use Topics  . Alcohol use: Yes    Alcohol/week: 1.8 oz    Types: 3 Cans of beer per week    Comment: weekends,  a couple   . Drug use: Yes    Frequency: 2.0 times per week    Types: Marijuana    Comment: for pain and leisure,occasionally    Family History  Problem Relation Age of Onset  . Stroke Maternal Aunt   . Heart disease Neg Hx     Objective:   Vitals:   05/21/17 1353  BP: (!) 168/66  Pulse: (!) 56  Temp: 97.9 F (36.6 C)   Constitutional: appears well-nourished and in no distress today.  Eyes: anicteric Cardiovascular: Cor RRR and No murmurs Respiratory: clear Gastrointestinal: Bowel sounds are normal, liver is not enlarged, spleen is not enlarged Musculoskeletal: peripheral pulses normal, no pedal edema, no clubbing or cyanosis, no pedal edema  noted Skin: negative for - jaundice, spider hemangioma, telangiectasia, palmar erythema, ecchymosis and atrophy; no porphyria cutanea tarda Lymphatic: no cervical lymphadenopathy   Laboratory Genotype:  Lab Results  Component Value Date   HCVGENOTYPE 1b 05/21/2017   HCV viral load: 3,350,000 IU/mL 04/2017  Lab Results  Component Value Date   WBC 3.6 (L) 05/21/2017   HGB 13.1 (L) 05/21/2017   HCT 37.5 (L) 05/21/2017   MCV 92.8 05/21/2017   PLT 232 05/21/2017    Lab Results  Component Value Date   CREATININE 1.11 05/21/2017   BUN 13 05/21/2017   NA 139 05/21/2017   K 4.2 05/21/2017   CL 102 05/21/2017   CO2 29 05/21/2017    Lab Results  Component Value Date   ALT 45 05/21/2017   AST 55 (H) 05/21/2017   ALKPHOS 72 09/05/2016    Lab Results  Component Value Date   INR 1.0 05/21/2017   BILITOT 0.5 05/21/2017   ALBUMIN 3.3 (L) 09/07/2016   Labs and history reviewed and show CHILD-PUGH A  5-6 points: Child class A 7-9 points: Child class B 10-15 points: Child class C  Assessment & Plan:  Problem List Items Addressed This Visit      Digestive   Chronic Hepatitis C  - Primary    New Patient with Chronic Hepatitis C genotype 1b, treatment naive.   I discussed with the patient the lab findings that confirm chronic hepatitis C as well as the natural history and progression of disease including about 30% of people who develop cirrhosis of the liver if left untreated and once cirrhosis is established there is a 2-7% risk per year of liver cancer and liver failure.  I discussed the importance of treatment and benefits in reducing the risk, even if significant liver fibrosis exists. I also discussed risk for re-infection following treatment should he not continue to modify risk factors.    Patient counseled extensively on limiting acetaminophen to no more than 2 grams daily, avoidance of alcohol.  Transmission discussed with patient including sexual transmission, sharing  razors and toothbrush.   Will need referral to gastroenterology if concern for cirrhosis  NO need for substance abuse counseling/rehab program presently   Will prescribe appropriate medication based on genotype and coverage   Hepatitis A and B titers to be drawn today with appropriate vaccinations as needed   Pneumovax vaccine at upcoming visit if not previously given  Further work up to include liver staging with elastography  He will return for an appointment to review results and discuss plan for treatment once liver ultrasound/elastography is obtained.   Will reach out to neurology team to see if we can change him from apixaban to lovenox considering we cannot reliably monitor interaction with DAA's.  If this is not possible due to his underlying condition would alteratively need to increase monitoring on DAA treatment to ensure no bleeding. Counseled on interaction risk and he is open to whatever we suggest to cure his Hep C.        Relevant Orders   Hepatitis C genotype (Completed)   COMPLETE METABOLIC PANEL WITH GFR (Completed)   INR/PT (Completed)   CBC (Completed)   Liver Fibrosis, FibroTest-ActiTest (Completed)   Hepatitis A Ab, Total (Completed)   Hepatitis B surface antibody (Completed)   Hepatitis B surface antigen (Completed)   US ABDOMEN COMPLETE W/ELASTOGRAPHY (Completed)    Other Visit Diagnoses    Screening for HIV (human immunodeficiency virus)       Relevant Orders   HIV antibody (Completed)     Janene Madeira, MSN, NP-C [facility] for [place] [facility] Office: [phone] Pager: [phone]

## 2017-05-22 LAB — COMPLETE METABOLIC PANEL WITH GFR
AG Ratio: 1.3 (calc) (ref 1.0–2.5)
ALBUMIN MSPROF: 4.2 g/dL (ref 3.6–5.1)
ALT: 48 U/L — ABNORMAL HIGH (ref 9–46)
AST: 55 U/L — AB (ref 10–35)
Alkaline phosphatase (APISO): 96 U/L (ref 40–115)
BUN: 13 mg/dL (ref 7–25)
CO2: 29 mmol/L (ref 20–32)
CREATININE: 1.11 mg/dL (ref 0.70–1.25)
Calcium: 9.2 mg/dL (ref 8.6–10.3)
Chloride: 102 mmol/L (ref 98–110)
GFR, Est African American: 80 mL/min/{1.73_m2} (ref >=60)
GFR, Est Non African American: 69 mL/min/{1.73_m2} (ref >=60)
GLUCOSE: 102 mg/dL — AB (ref 65–99)
Globulin: 3.2 g/dL (calc) (ref 1.9–3.7)
Potassium: 4.2 mmol/L (ref 3.5–5.3)
Sodium: 139 mmol/L (ref 135–146)
Total Bilirubin: 0.5 mg/dL (ref 0.2–1.2)
Total Protein: 7.4 g/dL (ref 6.1–8.1)

## 2017-05-22 LAB — HEPATITIS A ANTIBODY, TOTAL: HEPATITIS A AB,TOTAL: REACTIVE — AB

## 2017-05-22 LAB — HIV ANTIBODY (ROUTINE TESTING W REFLEX): HIV 1&2 Ab, 4th Generation: NONREACTIVE

## 2017-05-22 LAB — CBC
HEMATOCRIT: 37.5 % — AB (ref 38.5–50.0)
HEMOGLOBIN: 13.1 g/dL — AB (ref 13.2–17.1)
MCH: 32.4 pg (ref 27.0–33.0)
MCHC: 34.9 g/dL (ref 32.0–36.0)
MCV: 92.8 fL (ref 80.0–100.0)
MPV: 10.5 fL (ref 7.5–12.5)
Platelets: 232 10*3/uL (ref 140–400)
RBC: 4.04 10*6/uL — ABNORMAL LOW (ref 4.20–5.80)
RDW: 13 % (ref 11.0–15.0)
WBC: 3.6 10*3/uL — AB (ref 3.8–10.8)

## 2017-05-22 LAB — PROTIME-INR
INR: 1
Prothrombin Time: 10.6 s (ref 9.0–11.5)

## 2017-05-22 LAB — HEPATITIS B SURFACE ANTIBODY,QUALITATIVE: Hep B S Ab: BORDERLINE — AB

## 2017-05-22 LAB — HEPATITIS B SURFACE ANTIGEN: Hepatitis B Surface Ag: NONREACTIVE

## 2017-05-23 ENCOUNTER — Ambulatory Visit: Payer: Medicare HMO | Admitting: Physical Therapy

## 2017-05-23 LAB — HEPATITIS C GENOTYPE

## 2017-05-27 LAB — LIVER FIBROSIS, FIBROTEST-ACTITEST
ALT: 45 U/L (ref 9–46)
APOLIPOPROTEIN A1: 149 mg/dL (ref 94–176)
Alpha-2-Macroglobulin: 377 mg/dL — ABNORMAL HIGH (ref 106–279)
BILIRUBIN: 0.3 mg/dL (ref 0.2–1.2)
Fibrosis Score: 0.69
GGT: 162 U/L — AB (ref 3–70)
HAPTOGLOBIN: 113 mg/dL (ref 43–212)
Necroinflammat ACT Score: 0.38
REFERENCE ID: 2410850

## 2017-05-28 ENCOUNTER — Encounter: Payer: Self-pay | Admitting: Physical Therapy

## 2017-05-28 ENCOUNTER — Telehealth: Payer: Self-pay

## 2017-05-28 ENCOUNTER — Ambulatory Visit: Payer: Medicare HMO | Attending: Family Medicine | Admitting: Physical Therapy

## 2017-05-28 DIAGNOSIS — M542 Cervicalgia: Secondary | ICD-10-CM

## 2017-05-28 DIAGNOSIS — M6281 Muscle weakness (generalized): Secondary | ICD-10-CM | POA: Diagnosis present

## 2017-05-28 DIAGNOSIS — M25612 Stiffness of left shoulder, not elsewhere classified: Secondary | ICD-10-CM

## 2017-05-28 NOTE — Therapy (Signed)
[place], [place], [address] Phone: [phone]   Fax:  [phone]  Physical Therapy Treatment  Patient Details  Name: Glenn [name] MRN: [id number] Date of Birth: 1950/10/27 Referring Provider: Rogue Bussing, MD    Encounter Date: 05/28/2017  PT End of Session - 05/28/17 1044    Visit Number  4    Number of Visits  12    Date for PT Re-Evaluation  06/14/17    PT Start Time  1038 23 minutes late     PT Stop Time  1116    PT Time Calculation (min)  38 min       Past Medical History:  Diagnosis Date  . Atrial fibrillation ([place])   . CHF (congestive heart failure) ([place])   . Hepatitis C   . Hypertension   . Stroke ([place])   . Visit for monitoring Tikosyn therapy 03/26/2017    Past Surgical History:  Procedure Laterality Date  . CARDIAC DEFIBRILLATOR PLACEMENT  2015  . CARDIOVERSION N/A 10/10/2016   Procedure: CARDIOVERSION;  Surgeon: Dorothy Spark, MD;  [facility] ENDOSCOPY;  Service: Cardiovascular;  Laterality: N/A;  . CARDIOVERSION N/A 03/27/2017   Procedure: CARDIOVERSION;  Surgeon: Jerline Pain, MD;  Location: [facility];  Service: Cardiovascular;  Laterality: N/A;  . EYE SURGERY Left 1990  . IR PERCUTANEOUS ART THROMBECTOMY/INFUSION INTRACRANIAL INC DIAG ANGIO  09/05/2016  . IR RADIOLOGIST EVAL & MGMT  10/03/2016  . RADIOLOGY WITH ANESTHESIA N/A 09/05/2016   Procedure: RADIOLOGY WITH ANESTHESIA;  Surgeon: Luanne Bras, MD;  Location: [facility];  Service: Radiology;  Laterality: N/A;    There were no vitals filed for this visit.  Subjective Assessment - 05/28/17 1040    Currently in Pain?  Yes    Pain Score  5     Pain Location  Neck    Pain Orientation  Left    Pain Descriptors / Indicators  Aching;Tightness    Aggravating Factors   just comes and goes for no reason    Pain Relieving Factors  exercises         [facility] PT Assessment - 05/28/17 0001      AROM   Cervical - Right Side Bend  20    Cervical - Left Side Bend  20    Cervical - Right Rotation  50    Cervical - Left Rotation  52                   [facility] Adult PT Treatment/Exercise - 05/28/17 0001      Neck Exercises: Supine   Neck Retraction  10 reps;3 secs    Other Supine Exercise  scapular retractions 1x10 cues for form       Moist Heat Therapy   Number Minutes Moist Heat  15 Minutes    Moist Heat Location  Cervical      Neck Exercises: Stretches   Upper Trapezius Stretch  2 reps;30 seconds;Right    Levator Stretch  3 reps;30 seconds                  PT Long Term Goals - 05/28/17 1243      PT LONG TERM GOAL #1   Title  Pt will be able to improve FOTO score to 40% or better to demo functional improvement.     Time  6    Status  On-going      PT LONG TERM GOAL #2  Title  Pt will be able to lift arms overhead without neck pain in preparation for work, manual labor.     Time  6    Period  Weeks    Status  Unable to assess      PT LONG TERM GOAL #3   Title  Patient will demo pain free AROM in cervical spine for comfort with reading and social interaction    Baseline  pain with AROM    Time  6    Period  Weeks    Status  On-going      PT LONG TERM GOAL #4   Title  Pt will be I with cervical HEP, posture , UE strength     Baseline  still needs cues     Time  6    Period  Weeks    Status  On-going            Plan - 05/28/17 1239    Clinical Impression Statement  Pt arrives 23 minutes late. He demonstrates Improved Cervical AROM however still with pain. Pt reports pain travels from right upper trap to left upper trap. More education provided about posture and the role it plays in pain. He would like to try TPDN and was scheduled today. He still requires cues for current HEP.     PT Next Visit Plan  consider dry needling especially to scalenes/upper trao if able/appropriate, continue exericse/ROM as tolerated, soft tissue interventions and  stretching     PT Home Exercise Plan  chin tuck, scap squeeze, gentle rotation     Consulted and Agree with Plan of Care  Patient       Patient will benefit from skilled therapeutic intervention in order to improve the following deficits and impairments:  Decreased mobility, Hypomobility, Increased muscle spasms, Improper body mechanics, Pain, Postural dysfunction, Impaired UE functional use, Impaired flexibility, Increased fascial restricitons, Decreased strength, Decreased range of motion  Visit Diagnosis: Cervicalgia  Stiffness of left shoulder, not elsewhere classified  Muscle weakness (generalized)     Problem List Patient Active Problem List   Diagnosis Date Noted  . Neck pain 04/06/2017  . Paroxysmal atrial fibrillation ([name])   . Visit for monitoring Tikosyn therapy 03/25/2017  . ICD (implantable cardioverter-defibrillator) in place 09/13/2016  . Chest pain 09/13/2016  . Tobacco abuse 09/13/2016  . Hospital discharge follow-up 09/13/2016  . Upper back pain 09/13/2016  . Housing problems 09/13/2016  . History of hepatitis C 09/13/2016  . Persistent atrial fibrillation ([place])   . Atrial fibrillation with RVR ([place])   . Ischemic cardiomyopathy   . Cerebral infarction ([place])   . Stroke (cerebrum) ([place]) 09/05/2016  . CHF (congestive heart failure) ([place]) 08/14/2016  . HTN (hypertension) 08/14/2016  . Hepatitis C 08/14/2016    Dorene Ar, PTA 05/28/2017, 12:46 PM  [place], [place], [address] Phone: [phone]   Fax:  [phone]  Name: Glenn [name] MRN: [id number] Date of Birth: December 26, 1950

## 2017-05-28 NOTE — Telephone Encounter (Signed)
I called Glenn [name], offered him a sooner appt with Janett Billow, NP rather than his currently scheduled appt with Hoyle Sauer, NP. Glenn [name] is agreeable to an appt on 05/31/17 at 11:30am with Janett Billow, arrival time 11:00am. Glenn [name] asked me to send him a mychart message with all of this information in it.

## 2017-05-29 ENCOUNTER — Ambulatory Visit (HOSPITAL_COMMUNITY)
Admission: RE | Admit: 2017-05-29 | Discharge: 2017-05-29 | Disposition: A | Discharge status: Home / Self Care | Payer: Medicare HMO | Source: Ambulatory Visit | Attending: Infectious Diseases | Admitting: Infectious Diseases

## 2017-05-29 DIAGNOSIS — B182 Chronic viral hepatitis C: Secondary | ICD-10-CM

## 2017-05-30 ENCOUNTER — Ambulatory Visit (HOSPITAL_COMMUNITY)
Admission: RE | Admit: 2017-05-30 | Discharge: 2017-05-30 | Disposition: A | Discharge status: Home / Self Care | Payer: Medicare HMO | Source: Ambulatory Visit | Attending: Infectious Diseases | Admitting: Infectious Diseases

## 2017-05-30 DIAGNOSIS — K8020 Calculus of gallbladder without cholecystitis without obstruction: Secondary | ICD-10-CM | POA: Diagnosis not present

## 2017-05-30 DIAGNOSIS — B182 Chronic viral hepatitis C: Secondary | ICD-10-CM | POA: Diagnosis not present

## 2017-05-31 ENCOUNTER — Other Ambulatory Visit: Payer: Self-pay | Admitting: Internal Medicine

## 2017-05-31 ENCOUNTER — Encounter: Payer: Self-pay | Admitting: Adult Health

## 2017-05-31 ENCOUNTER — Ambulatory Visit (INDEPENDENT_AMBULATORY_CARE_PROVIDER_SITE_OTHER): Payer: Medicare HMO | Admitting: Adult Health

## 2017-05-31 VITALS — BP 151/72 | HR 78 | Ht 67.0 in | Wt 182.6 lb

## 2017-05-31 DIAGNOSIS — I63412 Cerebral infarction due to embolism of left middle cerebral artery: Secondary | ICD-10-CM | POA: Diagnosis not present

## 2017-05-31 DIAGNOSIS — I10 Essential (primary) hypertension: Secondary | ICD-10-CM | POA: Diagnosis not present

## 2017-05-31 DIAGNOSIS — E785 Hyperlipidemia, unspecified: Secondary | ICD-10-CM

## 2017-05-31 MED ORDER — APIXABAN 5 MG PO TABS: 5.0000 mg | ORAL_TABLET | Freq: Two times a day (BID) | ORAL | 0 refills | Status: DC

## 2017-05-31 NOTE — Patient Instructions (Signed)
Continue Eliquis (apixaban) daily  for secondary stroke prevention  Follow up with cardiologist regarding atrial fibrillation management  Continue to stay active and eat a healthy diet  Maintain strict control of hypertension with blood pressure goal below 130/90, diabetes with hemoglobin A1c goal below 6.5% and cholesterol with LDL cholesterol (bad cholesterol) goal below 70 mg/dL. I also advised the patient to eat a healthy diet with plenty of whole grains, cereals, fruits and vegetables, exercise regularly and maintain ideal body weight.  Followup in the future with me in 6 months or call earlier if needed

## 2017-05-31 NOTE — Progress Notes (Addendum)
[facility] [address] [place]. [place] [address] [phone]       OFFICE FOLLOW UP NOTE  [name] Date of Birth:  07/25/1950 Medical Record Number:  [id number]   Reason for Referral:  hospital stroke follow up  CHIEF COMPLAINT:  Chief Complaint  Patient presents with  . Follow-up    Stroke follow up    HPI: Glenn [name] is being seen today for follow up visit in the office for left MCA stroke on 09/05/16. History obtained from patient and chart review. Reviewed all radiology images and labs personally.  Glenn Dejournetteis an 67 y.o.malewho presents to the ED as a Code Stroke. He was last seen normal at 7:30 PM, then became suddenly mute with right upper extremity weakness and right facial droop. Deficits were noted by his son, who immediately called 911. At his baseline he is alert, oriented and fully independent.  On arrival to the ED he remained mute with right hemiparesis and prominent right facial droop, in addition to leftward gaze deviation and relative neglect of his right side. CT head reviewed and negative for hemorrhage or acute hypodensity. Hyperdense left M2 segment compatible with acute thrombosis was noted. CTA of head revealed a left MCA superior division origin occlusion with intermediate collateralization. CTA neck revealed patent carotid and vertebral arteries. No dissection, aneurysm, or hemodynamically significant stenosis is identified. CT perfusion study showed no infarct core (CBF less than 30%) by automated RAPID processing. At risk ischemia in the left superior MCA distribution with volume 108 cc was noted.  Patient underwent mechanical thrombectomy for left M2 occlusion and tolerated this well without complication.  After procedure, he had no focal findings on exam with a NIH stroke scale of 0.  Repeat CT scan reviewed and showed no acute abnormalities.  2D echo showed an EF of 30-35%.  Unable to obtain MRI/MRA due to  implanted defibrillator.  LDL 44 and A1c 4.7.  PT/OT/SLP did not recommend therapies as they are not needed.  PTA, patient taking Xarelto due to atrial fibrillation history and aspirin 81 mg for cardiac history daily but as patient was compliant with this medication, it was recommended to switch to Eliquis and continue aspirin 81 mg.  Patient discharged home in stable condition.  Patient was seen in this office on 11/13/16 by Dr. Leonie Man. It was recommended at that time to continue Eliquis and aspirin.   Update 05/31/17: Patient returns today for a 6 month follow up.  Patient has been doing well and continues to take his Eliquis without bleeding or bruising.  He no longer takes aspirin as his cardiologist DC'd this medication.  Continues to take Tikosyn without side effects.  Blood pressure at today's visit 151/72 which patient knows this is elevated but was unable to take all his medications prior to coming to appointment.  He continues to stay active and eat a healthy diet.  No further concerns or new or worsening stroke/TIA symptoms.   ROS:   14 system review of systems performed and negative with exception of joint pain, aching muscles, and neck pain  PMH:  Past Medical History:  Diagnosis Date  . Atrial fibrillation ([place])   . CHF (congestive heart failure) ([place])   . Hepatitis C   . Hypertension   . Stroke ([place])   . Visit for monitoring Tikosyn therapy 03/26/2017    PSH:  Past Surgical History:  Procedure Laterality Date  . CARDIAC DEFIBRILLATOR PLACEMENT  2015  . CARDIOVERSION N/A 10/10/2016  Procedure: CARDIOVERSION;  Surgeon: Dorothy Spark, MD;  Location: [facility];  Service: Cardiovascular;  Laterality: N/A;  . CARDIOVERSION N/A 03/27/2017   Procedure: CARDIOVERSION;  Surgeon: Jerline Pain, MD;  Location: [facility];  Service: Cardiovascular;  Laterality: N/A;  . EYE SURGERY Left 1990  . IR PERCUTANEOUS ART THROMBECTOMY/INFUSION INTRACRANIAL INC DIAG ANGIO  09/05/2016  . IR  RADIOLOGIST EVAL & MGMT  10/03/2016  . RADIOLOGY WITH ANESTHESIA N/A 09/05/2016   Procedure: RADIOLOGY WITH ANESTHESIA;  Surgeon: Luanne Bras, MD;  Location: [facility];  Service: Radiology;  Laterality: N/A;    Social History:  Social History   Socioeconomic History  . Marital status: Single    Spouse name: Not on file  . Number of children: Not on file  . Years of education: 18 (some college)  . Highest education level: Not on file  Occupational History  . Occupation: disability  Social Needs  . Financial resource strain: Not on file  . Food insecurity:    Worry: Not on file    Inability: Not on file  . Transportation needs:    Medical: Not on file    Non-medical: Not on file  Tobacco Use  . Smoking status: Current Some Day Smoker    Packs/day: 0.50    Types: Cigarettes  . Smokeless tobacco: Never Used  . Tobacco comment: smoke 2 packs a week  Substance and Sexual Activity  . Alcohol use: Yes    Alcohol/week: 1.8 oz    Types: 3 Cans of beer per week    Comment: weekends, a couple   . Drug use: Yes    Frequency: 2.0 times per week    Types: Marijuana    Comment: for pain and leisure,occasionally  . Sexual activity: Yes    Partners: Female    Birth control/protection: Condom  Lifestyle  . Physical activity:    Days per week: Not on file    Minutes per session: Not on file  . Stress: Not on file  Relationships  . Social connections:    Talks on phone: Not on file    Gets together: Not on file    Attends religious service: Not on file    Active member of club or organization: Not on file    Attends meetings of clubs or organizations: Not on file    Relationship status: Not on file  . Intimate partner violence:    Fear of current or ex partner: Not on file    Emotionally abused: Not on file    Physically abused: Not on file    Forced sexual activity: Not on file  Other Topics Concern  . Not on file  Social History Narrative  . Not on file    Family  History:  Family History  Problem Relation Age of Onset  . Stroke Maternal Aunt   . Heart disease Neg Hx     Medications:   Current Outpatient Medications on File Prior to Visit  Medication Sig Dispense Refill  . apixaban (ELIQUIS) 5 MG TABS tablet Take 1 tablet (5 mg total) by mouth 2 (two) times daily. 60 tablet 6  . busPIRone (BUSPAR) 7.5 MG tablet Take 7.5 mg by mouth daily as needed for depression.    . carvedilol (COREG) 25 MG tablet Take 1 tablet (25 mg total) by mouth 2 (two) times daily with a meal. 60 tablet 6  . diazepam (VALIUM) 2 MG tablet Take 1 tablet (2 mg total) by mouth at bedtime as  needed. 30 tablet 0  . diclofenac sodium (VOLTAREN) 1 % GEL Apply to affected area as needed twice daily. 100 g 1  . dofetilide (TIKOSYN) 500 MCG capsule Take 1 capsule (500 mcg total) by mouth 2 (two) times daily. 60 capsule 6  . furosemide (LASIX) 40 MG tablet Take 1 tablet (40 mg total) by mouth daily. 30 tablet 6  . hydrALAZINE (APRESOLINE) 25 MG tablet Take 1 tablet (25 mg total) by mouth 2 (two) times daily. 60 tablet 11  . magnesium oxide (MAG-OX) 400 (241.3 Mg) MG tablet Take 1 tablet (400 mg total) by mouth 2 (two) times daily. 60 tablet 6  . potassium chloride SA (K-DUR,KLOR-CON) 20 MEQ tablet Take 1 tablet (20 mEq total) by mouth daily. 90 tablet 3   No current facility-administered medications on file prior to visit.     Allergies:  No Known Allergies   Physical Exam  Vitals:   05/31/17 1035  BP: (!) 151/72  Pulse: 78  Weight: 182 lb 9.6 oz (82.8 kg)  Height: 5\' 7"  (1.702 m)   Body mass index is 28.6 kg/m. No exam data present  General: well developed, pleasant African-American middle-aged male, well nourished, seated, in no evident distress Head: head normocephalic and atraumatic.   Neck: supple with no carotid or supraclavicular bruits Cardiovascular: regular rate and rhythm, no murmurs Musculoskeletal: no deformity Skin:  no rash/petichiae Vascular:  Normal  pulses all extremities  Neurologic Exam Mental Status: Awake and fully alert. Oriented to place and time. Recent and remote memory intact. Attention span, concentration and fund of knowledge appropriate. Mood and affect appropriate.  Cranial Nerves: Fundoscopic exam reveals sharp disc margins. Pupils equal, briskly reactive to light. Extraocular movements full without nystagmus. Visual fields full to confrontation. Hearing intact. Facial sensation intact. Face, tongue, palate moves normally and symmetrically.  Motor: Normal bulk and tone. Normal strength in all tested extremity muscles. Sensory.: intact to touch , pinprick , position and vibratory sensation.  Coordination: Rapid alternating movements normal in all extremities. Finger-to-nose and heel-to-shin performed accurately bilaterally. Gait and Station: Arises from chair without difficulty. Stance is normal. Gait demonstrates normal stride length and balance . Able to heel, toe and tandem walk without difficulty.  Reflexes: 1+ and symmetric. Toes downgoing.    NIHSS  0 Modified Rankin  1 HAS-BLED 2 CHA2DS2-VASc 6   Diagnostic Data (Labs, Imaging, Testing)   Ct Angio Head W Or Wo Contrast Ct Angio Neck W And/or Wo Contrast 09/05/2016 CTA head:  1. Left MCA superior division origin occlusion with intermediate collateralization.  2. Otherwise patent circle of Willis without significant stenosis or aneurysm identified.   CTA neck:  1. Patent carotid and vertebral arteries. No dissection, aneurysm, or hemodynamically significant stenosis is identified.  2. 5 mm right upper lobe pulmonary nodule.  Ct Head Wo Contrast 09/06/2016 No hemorrhage or other acute abnormality following neurovascular intervention. Gray-white differentiation is preserved in the region of ischemia demonstrated on the earlier perfusion study.  Ct Cerebral Perfusion W Contrast 09/05/2016 CT perfusion: No infarct core (CBF less than 30%) by automated RAPID  processing. At risk ischemia in the left superior MCA distribution with volume 108 cc. CTA head:    Ct Head Code Stroke W/o Cm 09/05/2016  1. Negative for acute infarct or hemorrhage. Hyperdense left M2 segment compatible with acute thrombosis.  2. ASPECTS is 10   DSA - complete revascularization of occluded Lt MCA superior division with x 1 pass with solitaire 38mm x 40 mm  retriever device achieving a TICI 3 reperfusion.  CUS- Findings are consistent with a 1-39 percent stenosis involving the right internal carotid artery and the left internal carotid artery. The vertebral arteries demonstrate antegrade flow.  TTE- Left ventricle: The cavity size was normal. Systolic function was moderately to severely reduced. The estimated ejection fraction was in the range of 30% to 35%.Diffuse hypokinesis. Moderate hypokinesis of the anteroseptal, inferior, and inferoseptal myocardium. - Aortic valve: Transvalvular velocity was within the normal range. There was no stenosis. There was moderate regurgitation. Valve area (VTI): 2.25 cm^2. Valve area (Vmax): 2.37 cm^2. Valve area (Vmean): 2.34 cm^2. - Mitral valve: Transvalvular velocity was within the normal range. There was no evidence for stenosis. There was mild regurgitation. - Left atrium: The atrium was moderately dilated. - Right ventricle: The cavity size was normal. Wall thickness was normal. Systolic function was mildly reduced. - Right atrium: The atrium was moderately dilated. - Tricuspid valve: There was mild-moderate regurgitation. - Pulmonary arteries: Systolic pressure was mildly increased. PA peak pressure: 38 mm Hg (S).    ASSESSMENT: Glenn [name] is a 67 y.o. year old male here with left MCA stroke on 09/05/16 secondary to atrial fibrillation and low EF although on Xarelto and ASA. Patient underwent mechanical thrombectomy with excellent clinical recovery Vascular risk factors include atrial  fibrillation with implanted defibrillator, CHF, and HTN. Patient returns today for a 6 month follow up and overall doing well without new or worsening stroke/TIA symptoms.    PLAN: -Continue Eliquis (apixaban) daily for secondary stroke prevention and atrial fibrillation -continue aspirin 81mg  for cardiac history -f/u with cardiologist for atrial fibrillation management  -patient will contact cardiologist for medication refills - refilled Eliquis for 30 days as he is out of this medication but patient aware that cardiologist will be refilling future medications -F/u with PCP regarding your HTN management -continue to monitor BP at home -continue to eat healthy and remain active -Maintain strict control of hypertension with blood pressure goal below 130/90, diabetes with hemoglobin A1c goal below 6.5% and cholesterol with LDL cholesterol (bad cholesterol) goal below 70 mg/dL. I also advised the patient to eat a healthy diet with plenty of whole grains, cereals, fruits and vegetables, exercise regularly and maintain ideal body weight.  Follow up in 6 months or call earlier if needed   Greater than 50% time during this 25 minute consultation visit was spent on counseling and coordination of care about a fib, and HTN, discussion about risk benefit of anticoagulation and answering questions.    Venancio Poisson, AGNP-BC  [facility] [address] [place], [place] [address]  Phone [phone] Fax [phone]

## 2017-06-03 ENCOUNTER — Ambulatory Visit: Payer: Medicare HMO | Admitting: Physical Therapy

## 2017-06-05 ENCOUNTER — Ambulatory Visit: Payer: Medicaid Other | Admitting: Nurse Practitioner

## 2017-06-06 ENCOUNTER — Encounter: Payer: Self-pay | Admitting: Physical Therapy

## 2017-06-06 ENCOUNTER — Ambulatory Visit: Payer: Medicare HMO | Admitting: Physical Therapy

## 2017-06-06 DIAGNOSIS — M25612 Stiffness of left shoulder, not elsewhere classified: Secondary | ICD-10-CM

## 2017-06-06 DIAGNOSIS — M542 Cervicalgia: Secondary | ICD-10-CM

## 2017-06-06 DIAGNOSIS — M6281 Muscle weakness (generalized): Secondary | ICD-10-CM

## 2017-06-06 NOTE — Therapy (Signed)
[place], [place], [address] Phone: [phone]   Fax:  [phone]  Physical Therapy Treatment  Patient Details  Name: Glenn [name] MRN: [id number] Date of Birth: [birth year] Referring Provider: Rogue Bussing, MD    Encounter Date: 06/06/2017  PT End of Session - 06/06/17 1413    Visit Number  5    Number of Visits  12    Date for PT Re-Evaluation  06/14/17    PT Start Time  1329    PT Stop Time  1410    PT Time Calculation (min)  41 min    Activity Tolerance  Patient tolerated treatment well    Behavior During Therapy  [facility] for tasks assessed/performed       Past Medical History:  Diagnosis Date  . Atrial fibrillation ([place])   . CHF (congestive heart failure) ([place])   . Hepatitis C   . Hypertension   . Stroke ([place])   . Visit for monitoring Tikosyn therapy 03/26/2017    Past Surgical History:  Procedure Laterality Date  . CARDIAC DEFIBRILLATOR PLACEMENT  2015  . CARDIOVERSION N/A 10/10/2016   Procedure: CARDIOVERSION;  Surgeon: Dorothy Spark, MD;  Location: [facility] ENDOSCOPY;  Service: Cardiovascular;  Laterality: N/A;  . CARDIOVERSION N/A 03/27/2017   Procedure: CARDIOVERSION;  Surgeon: Jerline Pain, MD;  Location: [place];  Service: Cardiovascular;  Laterality: N/A;  . EYE SURGERY Left 1990  . IR PERCUTANEOUS ART THROMBECTOMY/INFUSION INTRACRANIAL INC DIAG ANGIO  09/05/2016  . IR RADIOLOGIST EVAL & MGMT  10/03/2016  . RADIOLOGY WITH ANESTHESIA N/A 09/05/2016   Procedure: RADIOLOGY WITH ANESTHESIA;  Surgeon: Luanne Bras, MD;  Location: [place];  Service: Radiology;  Laterality: N/A;    There were no vitals filed for this visit.  Subjective Assessment - 06/06/17 1333    Subjective  I am still interested in dry needling; when I raise my L arm a certain way it feels like it just tightens right up through my shoulder. It is no longer hurting up into my face.     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   A fib with defibrillator     Patient Stated Goals  Pt would like to be pain free.     Currently in Pain?  Yes    Pain Score  7     Pain Location  Shoulder    Pain Orientation  Left    Pain Descriptors / Indicators  Tightness;Aching    Pain Type  Chronic pain    Pain Radiating Towards  radiates down into shoulder     Pain Onset  In the past 7 days    Pain Frequency  Intermittent    Aggravating Factors   moving arm a certain way     Pain Relieving Factors  nothing     Effect of Pain on Daily Activities  limits exercise and normal activities                        [facility] Adult PT Treatment/Exercise - 06/06/17 0001      Neck Exercises: Seated   Neck Retraction  15 reps B rotations     Other Seated Exercise  3D thoracic and cervical rotaitons     Other Seated Exercise  shoulder rolls up-back-down ; scapular retractions 1x15 3 second holds       Neck Exercises: Supine   Neck Retraction  -- bilateral rotations seated  Other Supine Exercise  L shoulder PROM in supine 1x12 all directions       Neck Exercises: Prone   Other Prone Exercise  prone I, W, Y 1x10 each       Manual Therapy   Manual Therapy  Soft tissue mobilization    Manual therapy comments  performed separate from all other skilled services     Soft tissue mobilization  L upper trap and scalenes              PT Education - 06/06/17 1412    Education provided  Yes    Education Details  pathological movement pattern when upper trap is dominant, self-trigger point release with cane, POC for DN next session, avoid overhead movements for now until PT can train them more specifically     Person(s) Educated  Patient    Methods  Explanation    Comprehension  Verbalized understanding;Need further instruction          PT Long Term Goals - 05/28/17 1243      PT LONG TERM GOAL #1   Title  Pt will be able to improve FOTO score to 40% or better to demo functional  improvement.     Time  6    Status  On-going      PT LONG TERM GOAL #2   Title  Pt will be able to lift arms overhead without neck pain in preparation for work, manual labor.     Time  6    Period  Weeks    Status  Unable to assess      PT LONG TERM GOAL #3   Title  Patient will demo pain free AROM in cervical spine for comfort with reading and social interaction    Baseline  pain with AROM    Time  6    Period  Weeks    Status  On-going      PT LONG TERM GOAL #4   Title  Pt will be I with cervical HEP, posture , UE strength     Baseline  still needs cues     Time  6    Period  Weeks    Status  On-going            Plan - 06/06/17 1413    Clinical Impression Statement  Patient arrives today continuing to report improved pain as well as ongoing interest in dry needling; educated that he is scheduled for a DN appointment on Monday 06/10/17. Focused on cervical and scapular ROM and strengthening today, also included interventions for L shoulder as well due to patient complaints of significant pain and functional difficulty in this area as well as due to  potential impact on increased neck pain and dominant upper trap pattern noted today. Plan for DN interventions next session. Noted severe trigger points in L upper trap which were partially relieved with deep tissue work, he will strongly benefit from DN intervention moving forward.     Rehab Potential  Excellent    PT Frequency  2x / week    PT Duration  6 weeks    PT Treatment/Interventions  ADLs/Self Care Home Management;Cryotherapy;Traction;Ultrasound;Moist Heat;Iontophoresis 4mg /ml Dexamethasone;Functional mobility training;Neuromuscular re-education;Therapeutic activities;Patient/family education;Therapeutic exercise;Dry needling;Passive range of motion;Manual techniques;Taping    PT Next Visit Plan  Dry needling, train overhead movements with elimination of upper trap pattern . Update HEP     PT Home Exercise Plan  chin tuck,  scap squeeze, gentle rotation  Consulted and Agree with Plan of Care  Patient       Patient will benefit from skilled therapeutic intervention in order to improve the following deficits and impairments:  Decreased mobility, Hypomobility, Increased muscle spasms, Improper body mechanics, Pain, Postural dysfunction, Impaired UE functional use, Impaired flexibility, Increased fascial restricitons, Decreased strength, Decreased range of motion  Visit Diagnosis: Cervicalgia  Stiffness of left shoulder, not elsewhere classified  Muscle weakness (generalized)     Problem List Patient Active Problem List   Diagnosis Date Noted  . Neck pain 04/06/2017  . Paroxysmal atrial fibrillation ([name])   . Visit for monitoring Tikosyn therapy 03/25/2017  . ICD (implantable cardioverter-defibrillator) in place 09/13/2016  . Chest pain 09/13/2016  . Tobacco abuse 09/13/2016  . Hospital discharge follow-up 09/13/2016  . Upper back pain 09/13/2016  . Housing problems 09/13/2016  . History of hepatitis C 09/13/2016  . Persistent atrial fibrillation ([place])   . Atrial fibrillation with RVR ([place])   . Ischemic cardiomyopathy   . Cerebral infarction ([place])   . Stroke (cerebrum) ([place]) 09/05/2016  . CHF (congestive heart failure) ([place]) 08/14/2016  . HTN (hypertension) 08/14/2016  . Hepatitis C 08/14/2016    Deniece Ree PT, DPT, CBIS  Supplemental Physical Therapist [place]   Pager [facility] [address] [place], [place], [address] Phone: [phone]   Fax:  [phone]  Name: Glenn [name] MRN: [id number] Date of Birth: [birth year]

## 2017-06-07 NOTE — Progress Notes (Signed)
I agree with the above plan 

## 2017-06-10 ENCOUNTER — Ambulatory Visit: Payer: Medicare HMO | Admitting: Pharmacist Clinician (PhC)/ Clinical Pharmacy Specialist

## 2017-06-10 ENCOUNTER — Ambulatory Visit: Payer: Medicare HMO | Admitting: Physical Therapy

## 2017-06-10 ENCOUNTER — Encounter: Payer: Self-pay | Admitting: Internal Medicine

## 2017-06-10 DIAGNOSIS — B182 Chronic viral hepatitis C: Secondary | ICD-10-CM

## 2017-06-10 DIAGNOSIS — R911 Solitary pulmonary nodule: Secondary | ICD-10-CM | POA: Insufficient documentation

## 2017-06-10 HISTORY — DX: Solitary pulmonary nodule: R91.1

## 2017-06-10 MED ORDER — GLECAPREVIR-PIBRENTASVIR 100-40 MG PO TABS: 3.0000 | ORAL_TABLET | Freq: Every day | ORAL | 1 refills | Status: DC

## 2017-06-10 NOTE — Progress Notes (Addendum)
HPI: Glenn [name] is a 67 y.o. male here to discuss hep C treatment.  Lab Results  Component Value Date   HCVGENOTYPE 1b 05/21/2017    Allergies: No Known Allergies  Vitals:    Past Medical History: Past Medical History:  Diagnosis Date  . Atrial fibrillation ([place])   . CHF (congestive heart failure) ([place])   . Hepatitis C   . Hypertension   . Stroke ([place])   . Visit for monitoring Tikosyn therapy 03/26/2017    Social History: Social History   Socioeconomic History  . Marital status: Single    Spouse name: Not on file  . Number of children: Not on file  . Years of education: 35 (some college)  . Highest education level: Not on file  Occupational History  . Occupation: disability  Social Needs  . Financial resource strain: Not on file  . Food insecurity:    Worry: Not on file    Inability: Not on file  . Transportation needs:    Medical: Not on file    Non-medical: Not on file  Tobacco Use  . Smoking status: Current Some Day Smoker    Packs/day: 0.50    Types: Cigarettes  . Smokeless tobacco: Never Used  . Tobacco comment: smoke 2 packs a week  Substance and Sexual Activity  . Alcohol use: Yes    Alcohol/week: 1.8 oz    Types: 3 Cans of beer per week    Comment: weekends, a couple   . Drug use: Yes    Frequency: 2.0 times per week    Types: Marijuana    Comment: for pain and leisure,occasionally  . Sexual activity: Yes    Partners: Female    Birth control/protection: Condom  Lifestyle  . Physical activity:    Days per week: Not on file    Minutes per session: Not on file  . Stress: Not on file  Relationships  . Social connections:    Talks on phone: Not on file    Gets together: Not on file    Attends religious service: Not on file    Active member of club or organization: Not on file    Attends meetings of clubs or organizations: Not on file    Relationship status: Not on file  Other Topics Concern  . Not on file  Social History Narrative   . Not on file    Labs: Hep B S Ab (no units)  Date Value  05/21/2017 BORDERLINE (A)   Hepatitis B Surface Ag (no units)  Date Value  05/21/2017 NON-REACTIVE    Lab Results  Component Value Date   HCVGENOTYPE 1b 05/21/2017    No flowsheet data found.  AST (U/L)  Date Value  05/21/2017 55 (H)  09/05/2016 43 (H)   ALT (U/L)  Date Value  05/21/2017 45  05/21/2017 48 (H)  09/05/2016 33   INR (no units)  Date Value  05/21/2017 1.0  09/05/2016 1.30    CrCl: Estimated Creatinine Clearance: 68.3 mL/min (by C-G formula based on SCr of 1.11 mg/dL).  Fibrosis Score: F2/F3 as assessed by ARFI   Child-Pugh Score: A  Previous Treatment Regimen: naive  Assessment: Glenn [name] is here to discuss the treatment plan for Hep C (genotype 1b) given drug interaction potential with apixaban (all Hep C drugs can increase apixaban levels). He has Afib and has had a stroke before while taking Xarelto Glenn [name] will contact his neurology NP to see if we can switch apixaban to enoxaparin just  while he is being treated for hep C. The other option would be to continue apixaban and monitor very closely for bleeding. Glenn [name] is ok with switching to lovenox since this is the safer option.  Once we get lovenox approval from neurology we will begin the approval process for Mavryet x 8 weeks. We briefly discussed how Mavyret is packaged and how to take it. He agrees to have Glenn [name] to him once he is approved.  Recommendations: Switch apixaban to lovenox (pending approval from neurology) Start Mavryet x 8 weeks once anticoagulation plan is confirmed F/u with pharmacy 1-2 weeks after starting [place], PharmD PGY1 Pharmacy Resident [place] for Infectious Disease 06/10/17 2:46 PM   Glenn [name] has discussed with neurology and the plan is to keep him on apixaban. We will monitor him a little closer while he is on [place].   [name], PharmD, BCPS, AAHIVP,  CPP Infectious Disease Pharmacist Pager: [phone] 06/10/2017 4:39 PM

## 2017-06-10 NOTE — Progress Notes (Signed)
I would not recommend patient stop Eliquis and start Lovenox. Patient is taking Eliqius for atrial fibrillation and stroke prevention and Lovenox is not indicated for this use. Patient should continue on Eliquis to prevent stroke and atrial fibrillation management.

## 2017-06-10 NOTE — Assessment & Plan Note (Signed)
New Patient with Chronic Hepatitis C genotype 1b, treatment naive.   I discussed with the patient the lab findings that confirm chronic hepatitis C as well as the natural history and progression of disease including about 30% of people who develop cirrhosis of the liver if left untreated and once cirrhosis is established there is a 2-7% risk per year of liver cancer and liver failure.  I discussed the importance of treatment and benefits in reducing the risk, even if significant liver fibrosis exists. I also discussed risk for re-infection following treatment should he not continue to modify risk factors.    Patient counseled extensively on limiting acetaminophen to no more than 2 grams daily, avoidance of alcohol.  Transmission discussed with patient including sexual transmission, sharing razors and toothbrush.   Will need referral to gastroenterology if concern for cirrhosis  NO need for substance abuse counseling/rehab program presently   Will prescribe appropriate medication based on genotype and coverage   Hepatitis A and B titers to be drawn today with appropriate vaccinations as needed   Pneumovax vaccine at upcoming visit if not previously given  Further work up to include liver staging with elastography  He will return for an appointment to review results and discuss plan for treatment once liver ultrasound/elastography is obtained.   Will reach out to neurology team to see if we can change him from apixaban to lovenox considering we cannot reliably monitor interaction with DAA's. If this is not possible due to his underlying condition would alteratively need to increase monitoring on DAA treatment to ensure no bleeding. Counseled on interaction risk and he is open to whatever we suggest to cure his Hep C.

## 2017-06-10 NOTE — Assessment & Plan Note (Deleted)
New Patient with Chronic Hepatitis C genotype 1b, treatment naive.   I discussed with the patient the lab findings that confirm chronic hepatitis C as well as the natural history and progression of disease including about 30% of people who develop cirrhosis of the liver if left untreated and once cirrhosis is established there is a 2-7% risk per year of liver cancer and liver failure.  I discussed the importance of treatment and benefits in reducing the risk, even if significant liver fibrosis exists. I also discussed risk for re-infection following treatment should he not continue to modify risk factors.    Patient counseled extensively on limiting acetaminophen to no more than 2 grams daily, avoidance of alcohol.  Transmission discussed with patient including sexual transmission, sharing razors and toothbrush.   Will need referral to gastroenterology if concern for cirrhosis  NO need for substance abuse counseling/rehab program presently   Will prescribe appropriate medication based on genotype and coverage   Hepatitis A and B titers to be drawn today with appropriate vaccinations as needed   Pneumovax vaccine at upcoming visit if not previously given  Further work up to include liver staging with elastography  He will return for an appointment to review results and discuss plan for treatment once liver ultrasound/elastography is obtained.   Will reach out to neurology team to see if we can change him from apixaban to lovenox considering we cannot reliably monitor interaction with DAA's. If this is not possible due to his underlying condition would alteratively need to increase monitoring on DAA treatment to ensure no bleeding. Counseled on interaction risk and he is open to whatever we suggest to cure his Hep C.

## 2017-06-11 ENCOUNTER — Encounter: Payer: Self-pay | Admitting: Physical Therapy

## 2017-06-11 ENCOUNTER — Ambulatory Visit: Payer: Medicare HMO | Admitting: Physical Therapy

## 2017-06-11 DIAGNOSIS — M542 Cervicalgia: Secondary | ICD-10-CM | POA: Diagnosis not present

## 2017-06-11 DIAGNOSIS — M25612 Stiffness of left shoulder, not elsewhere classified: Secondary | ICD-10-CM

## 2017-06-11 DIAGNOSIS — M6281 Muscle weakness (generalized): Secondary | ICD-10-CM

## 2017-06-11 NOTE — Progress Notes (Signed)
Injectable Lovenox is not approved for secondary stroke prevention for atrial fibrillation and hence would not be recommended. If patient is willing to take the slightly higher risk of stroke I would rather switch to aspirin for this short period If acceptable to the patient

## 2017-06-11 NOTE — Therapy (Signed)
[place], [place], [address] Phone: [phone]   Fax:  [phone]  Physical Therapy Treatment  Patient Details  Name: Glenn [name] MRN: [id number] Date of Birth: 1950-07-27 Referring Provider: Rogue Bussing, MD    Encounter Date: 06/11/2017  PT End of Session - 06/11/17 1139    Visit Number  6    Number of Visits  12    Date for PT Re-Evaluation  06/14/17    PT Start Time  1100    PT Stop Time  1150    PT Time Calculation (min)  50 min    Activity Tolerance  Patient tolerated treatment well    Behavior During Therapy  [facility] for tasks assessed/performed       Past Medical History:  Diagnosis Date  . Atrial fibrillation ([place])   . CHF (congestive heart failure) ([place])   . Hepatitis C   . Hypertension   . Stroke ([place])   . Visit for monitoring Tikosyn therapy 03/26/2017    Past Surgical History:  Procedure Laterality Date  . CARDIAC DEFIBRILLATOR PLACEMENT  2015  . CARDIOVERSION N/A 10/10/2016   Procedure: CARDIOVERSION;  Surgeon: Dorothy Spark, MD;  Location: [facility] ENDOSCOPY;  Service: Cardiovascular;  Laterality: N/A;  . CARDIOVERSION N/A 03/27/2017   Procedure: CARDIOVERSION;  Surgeon: Jerline Pain, MD;  Location: [place];  Service: Cardiovascular;  Laterality: N/A;  . EYE SURGERY Left 1990  . IR PERCUTANEOUS ART THROMBECTOMY/INFUSION INTRACRANIAL INC DIAG ANGIO  09/05/2016  . IR RADIOLOGIST EVAL & MGMT  10/03/2016  . RADIOLOGY WITH ANESTHESIA N/A 09/05/2016   Procedure: RADIOLOGY WITH ANESTHESIA;  Surgeon: Luanne Bras, MD;  Location: [facility];  Service: Radiology;  Laterality: N/A;    There were no vitals filed for this visit.  Subjective Assessment - 06/11/17 1057    Subjective  "I've been doing my exercises at home but i still get some tightness in the shoulder"     Currently in Pain?  Yes    Pain Score  5     Pain Orientation  Right    Pain Frequency  Intermittent                        [facility] Adult PT Treatment/Exercise - 06/11/17 1134      Neck Exercises: Machines for Strengthening   UBE (Upper Arm Bike)  L1 x 5 min changing direction at 2:30 sec      Neck Exercises: Seated   Other Seated Exercise  lower trap strengthening with elbows on the bolster 2 x 15 with red therband      Neck Exercises: Prone   Other Prone Exercise  prone I, W, Y 1x10 each  1#      Moist Heat Therapy   Number Minutes Moist Heat  10 Minutes    Moist Heat Location  Cervical in supine      Manual Therapy   Manual Therapy  Joint mobilization;Taping    Manual therapy comments  skilled palpation and monitoring during TPDN    Joint Mobilization  T1-T8 Grade 3 Pa, L first rib mob grade 3    Soft tissue mobilization  IASTM along the upper trap and levator scapulae    [place]  inhibition taping for L upper trap      Neck Exercises: Stretches   Upper Trapezius Stretch  2 reps;30 seconds;Right    Levator Stretch  30 seconds;2 reps;Left  Trigger Point Dry Needling - 06/11/17 1134    Consent Given?  Yes    Education Handout Provided  Yes    Muscles Treated Upper Body  Sternocleidomastoid;Levator scapulae    Sternocleidomastoid Response  Twitch response elicited;Palpable increased muscle length    Levator Scapulae Response  Twitch response elicited;Palpable increased muscle length           PT Education - 06/11/17 1139    Education provided  Yes    Education Details  muscle anatomy and referral patterns. what TPDN is, benefits, what to expect and after care.     Person(s) Educated  Patient    Methods  Explanation;Verbal cues    Comprehension  Verbalized understanding;Verbal cues required          PT Long Term Goals - 05/28/17 1243      PT LONG TERM GOAL #1   Title  Pt will be able to improve FOTO score to 40% or better to demo functional improvement.     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     Time  6    Period  Weeks    Status  Unable to assess      PT LONG TERM GOAL #3   Title  Patient will demo pain free AROM in cervical spine for comfort with reading and social interaction    Baseline  pain with AROM    Time  6    Period  Weeks    Status  On-going      PT LONG TERM GOAL #4   Title  Pt will be I with cervical HEP, posture , UE strength     Baseline  still needs cues     Time  6    Period  Weeks    Status  On-going            Plan - 06/11/17 1140    Clinical Impression Statement  pt reports improvement since the last sessions but reports he is improving but conitnues to having tightness in the L upper trap/ levator scapulae. educated and performed TPDN along the L upper trap/ levator scapulae followed iwth IASTM techniques and mobs. trialed inhibition taping and strengthening ofthe lower trap. MHP end of session which he reported decreased tension and pain.     PT Next Visit Plan  response to DN, train overhead movements with elimination of upper trap pattern . Update HEP     PT Home Exercise Plan  chin tuck, scap squeeze, gentle rotation     Consulted and Agree with Plan of Care  Patient       Patient will benefit from skilled therapeutic intervention in order to improve the following deficits and impairments:  Decreased mobility, Hypomobility, Increased muscle spasms, Improper body mechanics, Pain, Postural dysfunction, Impaired UE functional use, Impaired flexibility, Increased fascial restricitons, Decreased strength, Decreased range of motion  Visit Diagnosis: Cervicalgia  Stiffness of left shoulder, not elsewhere classified  Muscle weakness (generalized)     Problem List Patient Active Problem List   Diagnosis Date Noted  . Solitary pulmonary nodule 06/10/2017  . Neck pain 04/06/2017  . Paroxysmal atrial fibrillation ([name])   . Visit for monitoring Tikosyn therapy  03/25/2017  . ICD (implantable cardioverter-defibrillator) in place 09/13/2016  . Chest pain 09/13/2016  .  Tobacco abuse 09/13/2016  . Hospital discharge follow-up 09/13/2016  . Upper back pain 09/13/2016  . Housing problems 09/13/2016  . Chronic viral hepatitis C ([place]) 09/13/2016  . Persistent atrial fibrillation ([place])   . Atrial fibrillation with RVR ([place])   . Ischemic cardiomyopathy   . Cerebral infarction ([place])   . Stroke (cerebrum) ([place]) 09/05/2016  . CHF (congestive heart failure) ([place]) 08/14/2016  . HTN (hypertension) 08/14/2016  . Chronic Hepatitis C  08/14/2016   Starr [place] PT, DPT, LAT, ATC  06/11/17  11:45 AM      [facility] [address] [place], [place], [address] Phone: [phone]   Fax:  [phone]  Name: Glenn [name] MRN: [id number] Date of Birth: Dec 31, 1950

## 2017-06-13 ENCOUNTER — Ambulatory Visit: Payer: Medicare HMO | Admitting: Physical Therapy

## 2017-06-13 ENCOUNTER — Encounter: Payer: Self-pay | Admitting: Physical Therapy

## 2017-06-13 DIAGNOSIS — M542 Cervicalgia: Secondary | ICD-10-CM | POA: Diagnosis not present

## 2017-06-13 DIAGNOSIS — M6281 Muscle weakness (generalized): Secondary | ICD-10-CM

## 2017-06-13 DIAGNOSIS — M25612 Stiffness of left shoulder, not elsewhere classified: Secondary | ICD-10-CM

## 2017-06-13 NOTE — Patient Instructions (Signed)
Over Head Pull: Narrow Grip       On back, knees bent, feet flat, band across thighs, elbows straight but relaxed. Pull hands apart (start). Keeping elbows straight, bring arms up and over head, hands toward floor. Keep pull steady on band. Hold momentarily. Return slowly, keeping pull steady, back to start. Repeat ___ times. Band color ______   Side Pull: Double Arm   On back, knees bent, feet flat. Arms perpendicular to body, shoulder level, elbows straight but relaxed. Pull arms out to sides, elbows straight. Resistance band comes across collarbones, hands toward floor. Hold momentarily. Slowly return to starting position. Repeat ___ times. Band color _____   Sash   On back, knees bent, feet flat, left hand on left hip, right hand above left. Pull right arm DIAGONALLY (hip to shoulder) across chest. Bring right arm along head toward floor. Hold momentarily. Slowly return to starting position. Repeat ___ times. Do with left arm. Band color ______   Shoulder Rotation: Double Arm   On back, knees bent, feet flat, elbows tucked at sides, bent 90, hands palms up. Pull hands apart and down toward floor, keeping elbows near sides. Hold momentarily. Slowly return to starting position. Repeat ___ times. Band color ______

## 2017-06-13 NOTE — Therapy (Signed)
[place], [place], [address] Phone: [phone]   Fax:  [phone]  Physical Therapy Treatment/Renewal  Patient Details  Name: Glenn [name] MRN: [id number] Date of Birth: 1951/01/08 Referring Provider: Rogue Bussing, MD    Encounter Date: 06/13/2017  PT End of Session - 06/13/17 1344    Visit Number  7    Number of Visits  15    Date for PT Re-Evaluation  07/11/17    PT Start Time  1331    PT Stop Time  1425    PT Time Calculation (min)  54 min    Activity Tolerance  Patient tolerated treatment well    Behavior During Therapy  Glenn [name] for tasks assessed/performed       Past Medical History:  Diagnosis Date  . Atrial fibrillation ([place])   . CHF (congestive heart failure) ([place])   . Hepatitis C   . Hypertension   . Stroke ([place])   . Visit for monitoring Tikosyn therapy 03/26/2017    Past Surgical History:  Procedure Laterality Date  . CARDIAC DEFIBRILLATOR PLACEMENT  2015  . CARDIOVERSION N/A 10/10/2016   Procedure: CARDIOVERSION;  Surgeon: Dorothy Spark, MD;  Location: [name] ENDOSCOPY;  Service: Cardiovascular;  Laterality: N/A;  . CARDIOVERSION N/A 03/27/2017   Procedure: CARDIOVERSION;  Surgeon: Jerline Pain, MD;  Location: [place];  Service: Cardiovascular;  Laterality: N/A;  . EYE SURGERY Left 1990  . IR PERCUTANEOUS ART THROMBECTOMY/INFUSION INTRACRANIAL INC DIAG ANGIO  09/05/2016  . IR RADIOLOGIST EVAL & MGMT  10/03/2016  . RADIOLOGY WITH ANESTHESIA N/A 09/05/2016   Procedure: RADIOLOGY WITH ANESTHESIA;  Surgeon: Luanne Bras, MD;  Location: [place];  Service: Radiology;  Laterality: N/A;    There were no vitals filed for this visit.  Subjective Assessment - 06/13/17 1334    Subjective  2/10-3/10 right now.  I'd like to wait and see how I do after 1-2 weeks.      Currently in Pain?  Yes    Pain Score  3     Pain Location  Shoulder    Pain Orientation  Left    Pain  Descriptors / Indicators  Tightness    Pain Type  Chronic pain    Pain Onset  More than a month ago    Pain Frequency  Intermittent    Aggravating Factors   sometimes I sleep on it wrong.     Pain Relieving Factors  stretching, MHP, PT          [facility] PT Assessment - 06/13/17 0001      Observation/Other Assessments   Focus on Therapeutic Outcomes (FOTO)   56%      AROM   Cervical Flexion  52    Cervical Extension  40    Cervical - Right Side Bend  26    Cervical - Left Side Bend  26    Cervical - Right Rotation  50    Cervical - Left Rotation  53      Strength   Left Shoulder Flexion  4/5    Left Shoulder ABduction  4/5          [facility] Adult PT Treatment/Exercise - 06/13/17 0001      Neck Exercises: Machines for Strengthening   UBE (Upper Arm Bike)  L1 for 5 min warm up       Neck Exercises: Standing   Neck Retraction  5 reps    Other Standing Exercises  supine overhead lift red x 10 and horiz pull x 10 red band       Neck Exercises: Supine   Upper Extremity Flexion with Stabilization  10 reps    Other Supine Exercise  sash x 10 each side     Other Supine Exercise  Er/IR red band x 10       Manual Therapy   Soft tissue mobilization  L upper trap, posterior cervicals and scalenes, levator scap     Passive ROM  rotation and sidebending     Manual Traction  gentle     [place]  --             PT Education - 06/13/17 1415    Education provided  Yes    Education Details  progress , renewal     Person(s) Educated  Patient    Methods  Explanation;Demonstration;Handout    Comprehension  Verbalized understanding;Returned demonstration          PT Long Term Goals - 06/13/17 1336      PT LONG TERM GOAL #1   Title  Pt will be able to improve FOTO score to 40% or better to demo functional improvement.       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     Baseline  little  pain with this     Status  Partially Met      PT LONG TERM GOAL #3   Title  Patient will demo pain free AROM in cervical spine for comfort with reading and social interaction    Baseline  min pain     Status  Partially Met      PT LONG TERM GOAL #4   Title  Pt will be I with cervical HEP, posture , UE strength     Status  On-going            Plan - 06/13/17 1348    Clinical Impression Statement  Patient is improved but cont to have pain in neck/shoulder and weakness in L shoulder.  He will benefit from another session of dry needling for more relief of neck tension.  He has rare instances of L UE numbness. He improved FOTO score only by 7%,, reporting moderate or more difficulty when using L uE in isolation for functional tasks.      PT Frequency  2x / week    PT Duration  4 weeks    PT Treatment/Interventions  ADLs/Self Care Home Management;Cryotherapy;Traction;Ultrasound;Moist Heat;Iontophoresis 51m/ml Dexamethasone;Functional mobility training;Neuromuscular re-education;Therapeutic activities;Patient/family education;Therapeutic exercise;Dry needling;Passive range of motion;Manual techniques;Taping    PT Next Visit Plan  repeat DN, train overhead movements with elimination of upper trap pattern  check new HEP for scapular stab    PT Home Exercise Plan  chin tuck, scap squeeze, gentle rotation , supine scap stab with red band, advance to standing     Consulted and Agree with Plan of Care  Patient       Patient will benefit from skilled therapeutic intervention in order to improve the following deficits and impairments:  Decreased mobility, Hypomobility, Increased muscle spasms, Improper body mechanics, Pain, Postural dysfunction, Impaired UE functional use, Impaired flexibility, Increased fascial restricitons, Decreased strength, Decreased range of motion  Visit Diagnosis: Cervicalgia  Stiffness of left shoulder, not elsewhere classified  Muscle weakness  (generalized)     Problem List Patient Active Problem List  Diagnosis Date Noted  . Solitary pulmonary nodule 06/10/2017  . Neck pain 04/06/2017  . Paroxysmal atrial fibrillation ([name])   . Visit for monitoring Tikosyn therapy 03/25/2017  . ICD (implantable cardioverter-defibrillator) in place 09/13/2016  . Chest pain 09/13/2016  . Tobacco abuse 09/13/2016  . Hospital discharge follow-up 09/13/2016  . Upper back pain 09/13/2016  . Housing problems 09/13/2016  . Chronic viral hepatitis C ([place]) 09/13/2016  . Persistent atrial fibrillation ([place])   . Atrial fibrillation with RVR ([place])   . Ischemic cardiomyopathy   . Cerebral infarction ([place])   . Stroke (cerebrum) ([place]) 09/05/2016  . CHF (congestive heart failure) ([place]) 08/14/2016  . HTN (hypertension) 08/14/2016  . Chronic Hepatitis C  08/14/2016    Glenn [name] 06/13/2017, 2:39 PM  [facility] [address] [place], [place], [address] Phone: [phone]   Fax:  [phone]  Name: Glenn [name] MRN: [id number] Date of Birth: 10-Aug-1950  Raeford Razor, PT 06/13/17 2:40 PM Phone: [phone] Fax: [phone]

## 2017-06-20 ENCOUNTER — Encounter: Payer: Self-pay | Admitting: Physical Therapy

## 2017-06-20 ENCOUNTER — Ambulatory Visit: Payer: Medicare HMO | Attending: Family Medicine | Admitting: Physical Therapy

## 2017-06-20 DIAGNOSIS — M542 Cervicalgia: Secondary | ICD-10-CM | POA: Diagnosis not present

## 2017-06-20 DIAGNOSIS — M6281 Muscle weakness (generalized): Secondary | ICD-10-CM | POA: Diagnosis present

## 2017-06-20 DIAGNOSIS — M25612 Stiffness of left shoulder, not elsewhere classified: Secondary | ICD-10-CM | POA: Diagnosis present

## 2017-06-20 NOTE — Therapy (Addendum)
[place], [place], [address] Phone: [phone]   Fax:  [phone]  Physical Therapy Treatment/Addended Discharge  Patient Details  Name: Glenn [name] MRN: [id number] Date of Birth: June 15, 1950 Referring Provider: Rogue Bussing, MD    Encounter Date: 06/20/2017  PT End of Session - 06/20/17 1359    Visit Number  8    Number of Visits  15    Date for PT Re-Evaluation  07/11/17    PT Start Time  1330    PT Stop Time  1416    PT Time Calculation (min)  46 min    Activity Tolerance  Patient tolerated treatment well    Behavior During Therapy  [facility] for tasks assessed/performed       Past Medical History:  Diagnosis Date  . Atrial fibrillation ([place])   . CHF (congestive heart failure) ([place])   . Hepatitis C   . Hypertension   . Stroke ([place])   . Visit for monitoring Tikosyn therapy 03/26/2017    Past Surgical History:  Procedure Laterality Date  . CARDIAC DEFIBRILLATOR PLACEMENT  2015  . CARDIOVERSION N/A 10/10/2016   Procedure: CARDIOVERSION;  Surgeon: Dorothy Spark, MD;  Location[facility] ENDOSCOPY;  Service: Cardiovascular;  Laterality: N/A;  . CARDIOVERSION N/A 03/27/2017   Procedure: CARDIOVERSION;  Surgeon: Jerline Pain, MD;  Location: [place];  Service: Cardiovascular;  Laterality: N/A;  . EYE SURGERY Left 1990  . IR PERCUTANEOUS ART THROMBECTOMY/INFUSION INTRACRANIAL INC DIAG ANGIO  09/05/2016  . IR RADIOLOGIST EVAL & MGMT  10/03/2016  . RADIOLOGY WITH ANESTHESIA N/A 09/05/2016   Procedure: RADIOLOGY WITH ANESTHESIA;  Surgeon: Luanne Bras, MD;  Location: [place];  Service: Radiology;  Laterality: N/A;    There were no vitals filed for this visit.  Subjective Assessment - 06/20/17 1334    Subjective  Patient has min pain in neck. yesterday pretty bad, 2/10 now.      Currently in Pain?  Yes    Pain Score  2     Pain Location  Neck    Pain Orientation  Left    Pain Type   Chronic pain    Pain Onset  More than a month ago    Pain Frequency  Intermittent          [facility] Adult PT Treatment/Exercise - 06/20/17 0001      Neck Exercises: Standing   Wall Push Ups  10 reps      Shoulder Exercises: Standing   Horizontal ABduction  Strengthening;Both;15 reps;Theraband    Theraband Level (Shoulder Horizontal ABduction)  Level 3 (Green)    Flexion  Strengthening;Left;10 reps    Theraband Level (Shoulder Flexion)  -- 1 plate     Extension  Strengthening;Both;15 reps    Theraband Level (Shoulder Extension)  -- 2 plates     Row  IFOYDXAJOINOM;VEHM;09 reps    Theraband Level (Shoulder Row)  -- 3 plates     Diagonals  Strengthening;Both;10 reps    Diagonals Weight (lbs)  1-2 plates, difficult on LLE max cues     Other Standing Exercises  looped resistance band at wall semicircles x 10 each       Manual Therapy   Soft tissue mobilization  L upper trap, posterior cervicals and scalenes, levator scap  trigger point release    Passive ROM  rotation and sidebending     Manual Traction  gentle       Neck Exercises: Stretches  Upper Trapezius Stretch  2 reps;30 seconds    Levator Stretch  2 reps;30 seconds                  PT Long Term Goals - 06/13/17 1336      PT LONG TERM GOAL #1   Title  Pt will be able to improve FOTO score to 40% or better to demo functional improvement.       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     Baseline  little pain with this     Status  Partially Met      PT LONG TERM GOAL #3   Title  Patient will demo pain free AROM in cervical spine for comfort with reading and social interaction    Baseline  min pain     Status  Partially Met      PT LONG TERM GOAL #4   Title  Pt will be I with cervical HEP, posture , UE strength     Status  On-going            Plan - 06/20/17 1427    Clinical Impression Statement  Pt continues to  improve, he has mod sized trigger points in L upper trap, lateral cervical mm.  He will be off next week as he is travelling but will get dry needling when he returns if needed.      PT Next Visit Plan  repeat DN, train overhead movements with elimination of upper trap pattern  check new HEP for scapular stab    PT Home Exercise Plan  chin tuck, scap squeeze, gentle rotation , supine scap stab with red band, advance to standing     Consulted and Agree with Plan of Care  Patient       Patient will benefit from skilled therapeutic intervention in order to improve the following deficits and impairments:  Decreased mobility, Hypomobility, Increased muscle spasms, Improper body mechanics, Pain, Postural dysfunction, Impaired UE functional use, Impaired flexibility, Increased fascial restricitons, Decreased strength, Decreased range of motion  Visit Diagnosis: Cervicalgia  Stiffness of left shoulder, not elsewhere classified  Muscle weakness (generalized)     Problem List Patient Active Problem List   Diagnosis Date Noted  . Solitary pulmonary nodule 06/10/2017  . Neck pain 04/06/2017  . Paroxysmal atrial fibrillation ([name])   . Visit for monitoring Tikosyn therapy 03/25/2017  . ICD (implantable cardioverter-defibrillator) in place 09/13/2016  . Chest pain 09/13/2016  . Tobacco abuse 09/13/2016  . Hospital discharge follow-up 09/13/2016  . Upper back pain 09/13/2016  . Housing problems 09/13/2016  . Chronic viral hepatitis C ([place]) 09/13/2016  . Persistent atrial fibrillation ([place])   . Atrial fibrillation with RVR ([place])   . Ischemic cardiomyopathy   . Cerebral infarction ([place])   . Stroke (cerebrum) ([place]) 09/05/2016  . CHF (congestive heart failure) ([place]) 08/14/2016  . HTN (hypertension) 08/14/2016  . Chronic Hepatitis C  08/14/2016    Saman Umstead 06/20/2017, 2:32 PM  [facility] [address] [place], [place], [address] Phone:  [phone]   Fax:  [phone]  Name: Glenn [name] MRN: [id number] Date of Birth: [birth year]  Raeford Razor, PT 06/20/17 2:32 PM Phone: [phone] Fax: [phone]   PHYSICAL THERAPY DISCHARGE SUMMARY  Visits from Start of Care: 8  Current functional level related to goals / functional outcomes: See above  for most recent   Remaining deficits: None limited function   Education / Equipment: HEP, posture Plan: Patient agrees to discharge.  Patient goals were met. Patient is being discharged due to being pleased with the current functional level.  ?????     Raeford Razor, PT 08/08/17 10:13 AM Phone: [phone] Fax: [phone]

## 2017-06-24 ENCOUNTER — Encounter: Payer: Medicare HMO | Admitting: *Deleted

## 2017-06-25 ENCOUNTER — Encounter: Payer: Self-pay | Admitting: Pharmacy Technician

## 2017-06-25 ENCOUNTER — Telehealth: Payer: Self-pay | Admitting: Cardiology

## 2017-06-25 ENCOUNTER — Encounter: Payer: Medicare HMO | Admitting: Physical Therapy

## 2017-06-25 MED FILL — MAVYRET 100-40 MG TABS: 100-40 | 28 days supply | Qty: 84 | Fill #0

## 2017-06-25 NOTE — Telephone Encounter (Signed)
Spoke with pt and reminded pt of remote transmission that is due today. Pt verbalized understanding.   

## 2017-06-26 ENCOUNTER — Encounter: Payer: Medicare HMO | Admitting: Physical Therapy

## 2017-06-26 ENCOUNTER — Other Ambulatory Visit: Payer: Self-pay | Admitting: Adult Health

## 2017-06-27 ENCOUNTER — Encounter: Payer: Self-pay | Admitting: Cardiology

## 2017-07-02 ENCOUNTER — Encounter: Payer: Medicare HMO | Admitting: Physical Therapy

## 2017-07-04 ENCOUNTER — Ambulatory Visit: Payer: Medicare HMO | Admitting: Physical Therapy

## 2017-07-08 ENCOUNTER — Ambulatory Visit (INDEPENDENT_AMBULATORY_CARE_PROVIDER_SITE_OTHER): Payer: Medicare HMO | Admitting: *Deleted

## 2017-07-08 ENCOUNTER — Encounter (HOSPITAL_COMMUNITY): Payer: Self-pay | Admitting: *Deleted

## 2017-07-08 DIAGNOSIS — I255 Ischemic cardiomyopathy: Secondary | ICD-10-CM | POA: Diagnosis not present

## 2017-07-08 NOTE — Progress Notes (Signed)
Remote ICD transmission.   

## 2017-07-09 ENCOUNTER — Ambulatory Visit: Payer: Medicare HMO | Admitting: Physical Therapy

## 2017-07-11 ENCOUNTER — Ambulatory Visit: Payer: Medicare HMO | Admitting: Physical Therapy

## 2017-07-11 LAB — CUP PACEART REMOTE DEVICE CHECK
Battery Remaining Longevity: 48 mo
Brady Statistic RV Percent Paced: 2 %
Date Time Interrogation Session: 20190519184700
HighPow Impedance: 45 Ohm
Implantable Lead Location: 753860
Implantable Lead Model: 295
Implantable Pulse Generator Implant Date: 20151113
Lead Channel Pacing Threshold Amplitude: 0.8 V
Lead Channel Setting Pacing Amplitude: 2.5 V
Lead Channel Setting Pacing Pulse Width: 0.5 ms
MDC IDC LEAD IMPLANT DT: 20151113
MDC IDC LEAD SERIAL: 135220
MDC IDC MSMT BATTERY REMAINING PERCENTAGE: 91 %
MDC IDC MSMT LEADCHNL RV IMPEDANCE VALUE: 860 Ohm
MDC IDC MSMT LEADCHNL RV PACING THRESHOLD PULSEWIDTH: 0.5 ms
MDC IDC PG SERIAL: 104417
MDC IDC SET LEADCHNL RV SENSING SENSITIVITY: 0.5 mV

## 2017-07-16 ENCOUNTER — Ambulatory Visit (INDEPENDENT_AMBULATORY_CARE_PROVIDER_SITE_OTHER): Payer: Medicare HMO | Admitting: Pharmacist Clinician (PhC)/ Clinical Pharmacy Specialist

## 2017-07-16 ENCOUNTER — Ambulatory Visit: Payer: Medicare HMO | Admitting: Physical Therapy

## 2017-07-16 DIAGNOSIS — B182 Chronic viral hepatitis C: Secondary | ICD-10-CM | POA: Diagnosis not present

## 2017-07-16 NOTE — Progress Notes (Signed)
HPI: Glenn [name] is a 67 y.o. male who is here to see pharmacy for his hep C follow up.   Lab Results  Component Value Date   HCVGENOTYPE 1b 05/21/2017    Allergies: No Known Allergies  Vitals:    Past Medical History: Past Medical History:  Diagnosis Date  . Atrial fibrillation ([place])   . CHF (congestive heart failure) ([place])   . Hepatitis C   . Hypertension   . Stroke ([place])   . Visit for monitoring Tikosyn therapy 03/26/2017    Social History: Social History   Socioeconomic History  . Marital status: Single    Spouse name: Not on file  . Number of children: Not on file  . Years of education: 107 (some college)  . Highest education level: Not on file  Occupational History  . Occupation: disability  Social Needs  . Financial resource strain: Not on file  . Food insecurity:    Worry: Not on file    Inability: Not on file  . Transportation needs:    Medical: Not on file    Non-medical: Not on file  Tobacco Use  . Smoking status: Current Some Day Smoker    Packs/day: 0.50    Types: Cigarettes  . Smokeless tobacco: Never Used  . Tobacco comment: smoke 2 packs a week  Substance and Sexual Activity  . Alcohol use: Yes    Alcohol/week: 1.8 oz    Types: 3 Cans of beer per week    Comment: weekends, a couple   . Drug use: Yes    Frequency: 2.0 times per week    Types: Marijuana    Comment: for pain and leisure,occasionally  . Sexual activity: Yes    Partners: Female    Birth control/protection: Condom  Lifestyle  . Physical activity:    Days per week: Not on file    Minutes per session: Not on file  . Stress: Not on file  Relationships  . Social connections:    Talks on phone: Not on file    Gets together: Not on file    Attends religious service: Not on file    Active member of club or organization: Not on file    Attends meetings of clubs or organizations: Not on file    Relationship status: Not on file  Other Topics Concern  . Not on file   Social History Narrative  . Not on file    Labs: Hep B S Ab (no units)  Date Value  05/21/2017 BORDERLINE (A)   Hepatitis B Surface Ag (no units)  Date Value  05/21/2017 NON-REACTIVE    Lab Results  Component Value Date   HCVGENOTYPE 1b 05/21/2017    No flowsheet data found.  AST (U/L)  Date Value  05/21/2017 55 (H)  09/05/2016 43 (H)   ALT (U/L)  Date Value  05/21/2017 45  05/21/2017 48 (H)  09/05/2016 33   INR (no units)  Date Value  05/21/2017 1.0  09/05/2016 1.30    CrCl: CrCl cannot be calculated (Patient's most recent lab result is older than the maximum 21 days allowed.).  Fibrosis Score: F2/3 as assessed by [facility]FI  Child-Pugh Score: Class A  Previous Treatment Regimen: None  Assessment: Suhan started on his Mavyret on 5/19 so he is only about a week in. We brought him back today to monitor him little more closely due to the potential interaction with his apixaban. He has not noticed any bleeding or side effects from the  Mavyret. The interaction is more on the side of potential rather than definite. Fortunately, he only needs 8 wks of therapy since he doesn't have cirrhosis. He will come back in 3 wks for labs. He will then f/u back with [place] for the end of therapy one. He will get the [facility] before the cure visit with [facility].   Recommendations:  Continue Mavyret x 8 wks F/u in 3 wks for labs F/u with pharmacy for EOT SVR12 then f/u with [facility], Pharm.D., BCPS, AAHIVP Clinical Infectious [place] for Infectious Disease 07/16/2017, 2:12 PM

## 2017-07-18 ENCOUNTER — Ambulatory Visit: Payer: Medicare HMO | Admitting: Physical Therapy

## 2017-07-23 ENCOUNTER — Other Ambulatory Visit: Payer: Self-pay | Admitting: *Deleted

## 2017-07-23 ENCOUNTER — Other Ambulatory Visit: Payer: Self-pay | Admitting: Adult Health

## 2017-07-23 MED ORDER — CARVEDILOL 25 MG PO TABS: 25.0000 mg | ORAL_TABLET | Freq: Two times a day (BID) | ORAL | 6 refills | Status: DC

## 2017-07-23 MED ORDER — APIXABAN 5 MG PO TABS: 5.0000 mg | ORAL_TABLET | Freq: Two times a day (BID) | ORAL | 9 refills | Status: DC

## 2017-07-23 NOTE — Telephone Encounter (Signed)
Pt last saw Dr [name] 04/22/17, last labs 05/21/17 Creat 1.11, age 67, weight 82.8kg, based on specified criteria pt is on appropriate dosage of Eliquis 5mg  BID.  Will refill rx.

## 2017-07-24 MED FILL — MAVYRET 100-40 MG TABS: 100-40 | 28 days supply | Qty: 84 | Fill #1

## 2017-07-25 ENCOUNTER — Other Ambulatory Visit (HOSPITAL_COMMUNITY): Payer: Self-pay | Admitting: *Deleted

## 2017-07-25 ENCOUNTER — Ambulatory Visit (HOSPITAL_COMMUNITY): Payer: Medicare Other | Admitting: Nurse Practitioner

## 2017-07-25 ENCOUNTER — Ambulatory Visit (HOSPITAL_COMMUNITY)
Admission: RE | Admit: 2017-07-25 | Discharge: 2017-07-25 | Disposition: A | Discharge status: Home / Self Care | Payer: Medicare HMO | Source: Ambulatory Visit | Attending: Nurse Practitioner | Admitting: Nurse Practitioner

## 2017-07-25 ENCOUNTER — Encounter (HOSPITAL_COMMUNITY): Payer: Self-pay | Admitting: Nurse Practitioner

## 2017-07-25 ENCOUNTER — Encounter: Payer: Self-pay | Admitting: Internal Medicine

## 2017-07-25 VITALS — BP 140/72 | HR 119 | Ht 67.0 in | Wt 184.0 lb

## 2017-07-25 DIAGNOSIS — Z95810 Presence of automatic (implantable) cardiac defibrillator: Secondary | ICD-10-CM | POA: Diagnosis not present

## 2017-07-25 DIAGNOSIS — Z7901 Long term (current) use of anticoagulants: Secondary | ICD-10-CM | POA: Diagnosis not present

## 2017-07-25 DIAGNOSIS — I509 Heart failure, unspecified: Secondary | ICD-10-CM | POA: Insufficient documentation

## 2017-07-25 DIAGNOSIS — I110 Hypertensive heart disease with heart failure: Secondary | ICD-10-CM | POA: Diagnosis present

## 2017-07-25 DIAGNOSIS — Z8673 Personal history of transient ischemic attack (TIA), and cerebral infarction without residual deficits: Secondary | ICD-10-CM | POA: Insufficient documentation

## 2017-07-25 DIAGNOSIS — Z79899 Other long term (current) drug therapy: Secondary | ICD-10-CM | POA: Insufficient documentation

## 2017-07-25 DIAGNOSIS — B1920 Unspecified viral hepatitis C without hepatic coma: Secondary | ICD-10-CM | POA: Diagnosis not present

## 2017-07-25 DIAGNOSIS — F17210 Nicotine dependence, cigarettes, uncomplicated: Secondary | ICD-10-CM | POA: Insufficient documentation

## 2017-07-25 DIAGNOSIS — I4819 Other persistent atrial fibrillation: Secondary | ICD-10-CM

## 2017-07-25 DIAGNOSIS — I481 Persistent atrial fibrillation: Secondary | ICD-10-CM | POA: Diagnosis present

## 2017-07-25 DIAGNOSIS — I4589 Other specified conduction disorders: Secondary | ICD-10-CM | POA: Diagnosis not present

## 2017-07-25 LAB — CBC
HEMATOCRIT: 38.1 % — AB (ref 39.0–52.0)
HEMOGLOBIN: 12.9 g/dL — AB (ref 13.0–17.0)
MCH: 32.7 pg (ref 26.0–34.0)
MCHC: 33.9 g/dL (ref 30.0–36.0)
MCV: 96.7 fL (ref 78.0–100.0)
Platelets: 196 10*3/uL (ref 150–400)
RBC: 3.94 MIL/uL — AB (ref 4.22–5.81)
RDW: 11.9 % (ref 11.5–15.5)
WBC: 3.4 10*3/uL — ABNORMAL LOW (ref 4.0–10.5)

## 2017-07-25 LAB — BASIC METABOLIC PANEL
ANION GAP: 7 (ref 5–15)
BUN: 9 mg/dL (ref 6–20)
CO2: 25 mmol/L (ref 22–32)
Calcium: 9 mg/dL (ref 8.9–10.3)
Chloride: 108 mmol/L (ref 101–111)
Creatinine, Ser: 1.18 mg/dL (ref 0.61–1.24)
GFR calc non Af Amer: >60 mL/min (ref >60)
Glucose, Bld: 106 mg/dL — ABNORMAL HIGH (ref 65–99)
POTASSIUM: 4.3 mmol/L (ref 3.5–5.1)
Sodium: 140 mmol/L (ref 135–145)

## 2017-07-25 LAB — MAGNESIUM: Magnesium: 1.6 mg/dL — ABNORMAL LOW (ref 1.7–2.4)

## 2017-07-25 MED ORDER — MAGNESIUM OXIDE 400 (241.3 MG) MG PO TABS: 400.0000 mg | ORAL_TABLET | Freq: Two times a day (BID) | ORAL | 6 refills | Status: DC

## 2017-07-25 MED ORDER — DILTIAZEM HCL ER COATED BEADS 120 MG PO CP24: 120.0000 mg | ORAL_CAPSULE | Freq: Every day | ORAL | 3 refills | Status: DC

## 2017-07-25 NOTE — Patient Instructions (Signed)
Start cardizem 120mg  once a day at bedtime

## 2017-07-25 NOTE — Progress Notes (Signed)
Primary Care Physician: Rogue Bussing, MD Referring Physician: Dr. Demetrius Charity Culp is a 67 y.o. male with a h/o afib on Xarelto with ICD, CHF, hepatitis C and hypertension who presented to [facility] 09/05/2016, with left MCA CVA.He did not receive TPA due to arriving outside the treatment window and being on Xarelto. He underwent chemical thrombectomy for left M2 occlusion. Patient was switched to Eliquis for secondary stroke prevention and aspirin 81 mg was added given his cardiac history. 2-D echo in the hospital LVEF 30-35%.   Patient moved here from [place] [employer] in May. He has a 2 year history of Atrial fibrillation and had an ICD-[employer] 12/2015 put in. Says he had a heart cath that showed no blockages but he had CHF. Smokes 1 ppd 30-40 yrs. Says he had chest tightness and shortness of breath for about a week prior to CVA. Also missed a couple of Xarelto doses.   Patient saw Dr. [name] as a new patient 09/19/16. Device interrogation demonstrated rapid atrial rates with averages over 100 bpm. He also had acute on chronic CHF. Dr. [name] felt the atrial fibrillation was likely contributing to his heart failure. Low-dose diltiazem was added despite the fact that he had CHF,based on the presumption that his atrial fibrillation rate was contributing to his heart failure. He also wanted to attempt cardioversion once. His LA size has discordant measurements. He felt if he could maintain sinus rhythm and potentially dofetilide or amiodarone might be beneficial. Lasix was increased to 40 mg daily.   He underwent successful cardioversion 10/10/16.  In the ED 02/14/17 with left chest and left shoulder pain that was reproducible to touch.  He was discharged on tramadol.   Patient was seen by [name] 03/19/17.  He had stopped his lisinopril for 2 months because it was making him feel dizzy but he started it back 3 days ago taking it at night.  He says this has helped.  Blood  pressure was quite low when he arrived at 78/46 but on recheck it was 98/46.  He also says his heart is skipping and racing especially at night when he lays down.  His breathing is stable.  Complaining of chronic aching pain in his shoulder and neck.   He said his tramadol made him feel like a zombie and he cannot tolerate it. Dr. [name] consulted re pt and  did discuss possible dofetilide or amiodarone.  Pt is now in the afib clinic, 2/4, for discussion of admission for Tikosyn. Pt has medicare/medicaid and cost should not be a problem. Specifics re Tikosyn discussed with pt, especially that he has to take specifically as written and can not miss doses, f/u is very important as scheduled with labs/ekg's. He would like to proceed. PharmD has reviewed drugs and no contraindicated or QTc prolonging medications. Denies any benadryl use, no missed doses of eliquis.    F/u hospitalization [facility] 2/4 thru 2/7 for tikosyn loading. He had successful cardioversion and left the hospital in NSR with stable qtc. He is taking Tikosyn correctly and feels improved. He has noted some issues with erectile dysfunction.  F/u in afib clinic, pt is in afib. He felt some irregular HB yesterday. Cardizem was stopped in March with Dr. [name] 2/2 fatigue, HR in the low 50's. Pt is planning to leave for [place] for 2-3 weeks this weekend. Will place back on cardizem as he has RVR today.   Today, he denies symptoms of palpitations, chest pain,  orthopnea, PND, lower extremity edema, dizziness, presyncope, syncope, or neurologic sequela. + for fatigue and shortness of breath with afib. The patient is tolerating medications without difficulties and is otherwise without complaint today.   Past Medical History:  Diagnosis Date  . Atrial fibrillation ([place])   . CHF (congestive heart failure) ([place])   . Hepatitis C   . Hypertension   . Stroke ([place])   . Visit for monitoring Tikosyn therapy 03/26/2017   Past Surgical History:  Procedure  Laterality Date  . CARDIAC DEFIBRILLATOR PLACEMENT  2015  . CARDIOVERSION N/A 10/10/2016   Procedure: CARDIOVERSION;  Surgeon: Dorothy Spark, MD;  Location: [facility] ENDOSCOPY;  Service: Cardiovascular;  Laterality: N/A;  . CARDIOVERSION N/A 03/27/2017   Procedure: CARDIOVERSION;  Surgeon: Jerline Pain, MD;  Location: [place];  Service: Cardiovascular;  Laterality: N/A;  . EYE SURGERY Left 1990  . IR PERCUTANEOUS ART THROMBECTOMY/INFUSION INTRACRANIAL INC DIAG ANGIO  09/05/2016  . IR RADIOLOGIST EVAL & MGMT  10/03/2016  . RADIOLOGY WITH ANESTHESIA N/A 09/05/2016   Procedure: RADIOLOGY WITH ANESTHESIA;  Surgeon: Luanne Bras, MD;  Location: [place];  Service: Radiology;  Laterality: N/A;    Current Outpatient Medications  Medication Sig Dispense Refill  . apixaban (ELIQUIS) 5 MG TABS tablet Take 1 tablet (5 mg total) by mouth 2 (two) times daily. 60 tablet 9  . carvedilol (COREG) 25 MG tablet Take 1 tablet (25 mg total) by mouth 2 (two) times daily with a meal. 60 tablet 6  . diclofenac sodium (VOLTAREN) 1 % GEL Apply to affected area as needed twice daily. 100 g 1  . dofetilide (TIKOSYN) 500 MCG capsule Take 1 capsule (500 mcg total) by mouth 2 (two) times daily. 60 capsule 6  . furosemide (LASIX) 40 MG tablet Take 1 tablet (40 mg total) by mouth daily. 30 tablet 6  . Glecaprevir-Pibrentasvir (MAVYRET) 100-40 MG TABS Take 3 tablets by mouth daily with supper. 84 tablet 1  . hydrALAZINE (APRESOLINE) 25 MG tablet Take 1 tablet (25 mg total) by mouth 2 (two) times daily. 60 tablet 11  . potassium chloride SA (K-DUR,KLOR-CON) 20 MEQ tablet Take 1 tablet (20 mEq total) by mouth daily. 90 tablet 3  . busPIRone (BUSPAR) 7.5 MG tablet Take 7.5 mg by mouth daily as needed for depression.    . diazepam (VALIUM) 2 MG tablet Take 1 tablet (2 mg total) by mouth at bedtime as needed. (Patient not taking: Reported on 07/16/2017) 30 tablet 0  . diltiazem (CARDIZEM CD) 120 MG 24 hr capsule Take 1 capsule  (120 mg total) by mouth at bedtime. 30 capsule 3  . magnesium oxide (MAG-OX) 400 (241.3 Mg) MG tablet Take 1 tablet (400 mg total) by mouth 2 (two) times daily. (Patient not taking: Reported on 07/25/2017) 60 tablet 6   No current facility-administered medications for this encounter.     No Known Allergies  Social History   Socioeconomic History  . Marital status: Single    Spouse name: Not on file  . Number of children: Not on file  . Years of education: 79 (some college)  . Highest education level: Not on file  Occupational History  . Occupation: disability  Social Needs  . Financial resource strain: Not on file  . Food insecurity:    Worry: Not on file    Inability: Not on file  . Transportation needs:    Medical: Not on file    Non-medical: Not on file  Tobacco Use  .  Smoking status: Current Some Day Smoker    Packs/day: 0.50    Types: Cigarettes  . Smokeless tobacco: Never Used  . Tobacco comment: smoke 2 packs a week  Substance and Sexual Activity  . Alcohol use: Yes    Alcohol/week: 1.8 oz    Types: 3 Cans of beer per week    Comment: weekends, a couple   . Drug use: Yes    Frequency: 2.0 times per week    Types: Marijuana    Comment: for pain and leisure,occasionally  . Sexual activity: Yes    Partners: Female    Birth control/protection: Condom  Lifestyle  . Physical activity:    Days per week: Not on file    Minutes per session: Not on file  . Stress: Not on file  Relationships  . Social connections:    Talks on phone: Not on file    Gets together: Not on file    Attends religious service: Not on file    Active member of club or organization: Not on file    Attends meetings of clubs or organizations: Not on file    Relationship status: Not on file  . Intimate partner violence:    Fear of current or ex partner: Not on file    Emotionally abused: Not on file    Physically abused: Not on file    Forced sexual activity: Not on file  Other Topics  Concern  . Not on file  Social History Narrative  . Not on file    Family History  Problem Relation Age of Onset  . Stroke Maternal Aunt   . Heart disease Neg Hx     ROS- All systems are reviewed and negative except as per the HPI above  Physical Exam: Vitals:   07/25/17 1040  BP: 140/72  Pulse: (!) 119  Weight: 184 lb (83.5 kg)  Height: 5\' 7"  (1.702 m)   Wt Readings from Last 3 Encounters:  07/25/17 184 lb (83.5 kg)  05/31/17 182 lb 9.6 oz (82.8 kg)  05/21/17 187 lb (84.8 kg)    Labs: Lab Results  Component Value Date   NA 140 07/25/2017   K 4.3 07/25/2017   CL 108 07/25/2017   CO2 25 07/25/2017   GLUCOSE 106 (H) 07/25/2017   BUN 9 07/25/2017   CREATININE 1.18 07/25/2017   CALCIUM 9.0 07/25/2017   PHOS 2.7 09/07/2016   MG 1.6 (L) 07/25/2017   Lab Results  Component Value Date   INR 1.0 05/21/2017   Lab Results  Component Value Date   CHOL 96 09/06/2016   HDL 26 (L) 09/06/2016   LDLCALC 44 09/06/2016   TRIG 128 09/06/2016     GEN- The patient is well appearing, alert and oriented x 3 today.   Head- normocephalic, atraumatic Eyes-  Sclera clear, conjunctiva pink Ears- hearing intact Oropharynx- clear Neck- supple, no JVP Lymph- no cervical lymphadenopathy Lungs- Clear to ausculation bilaterally, normal work of breathing Heart-irregular rate and rhythm, no murmurs, rubs or gallops, PMI not laterally displaced GI- soft, NT, ND, + BS Extremities- no clubbing, cyanosis, or edema MS- no significant deformity or atrophy Skin- no rash or lesion Psych- euthymic mood, full affect Neuro- strength and sensation are intact  EKG- SR at sinus brady at 53 bpm, pr int 170 ms, qrs int 90 ms, qtc 454 ms (stable) Epic records reviewed Echo-Study Conclusions  - Left ventricle: The cavity size was normal. Systolic function was   moderately to severely  reduced. The estimated ejection fraction   was in the range of 30% to 35%. Diffuse hypokinesis. Moderate    hypokinesis of the anteroseptal, inferior, and inferoseptal   myocardium. - Aortic valve: Transvalvular velocity was within the normal range.   There was no stenosis. There was moderate regurgitation. Valve   area (VTI): 2.25 cm^2. Valve area (Vmax): 2.37 cm^2. Valve area   (Vmean): 2.34 cm^2. - Mitral valve: Transvalvular velocity was within the normal range.   There was no evidence for stenosis. There was mild regurgitation. - Left atrium: The atrium was moderately dilated. - Right ventricle: The cavity size was normal. Wall thickness was   normal. Systolic function was mildly reduced. - Right atrium: The atrium was moderately dilated. - Tricuspid valve: There was mild-moderate regurgitation. - Pulmonary arteries: Systolic pressure was mildly increased. PA   peak pressure: 38 mm Hg (S).   Assessment and Plan: 1. Persistent afib  Found to be in afib today States no  change in his health, no obvious triggers Continue tikosyn at 500 mcg bid, qtc over 500 ms, but this is with pt out of rhythm  Will add back on cardizem 120 mg qd Continue to take eliquis 5 mg bid No benadryl use Bmet/mag today  Pt was told to f/u in [place] if he had issues with rapid heart rate, swelling, increased shortness of breath. He lived in the area in many years and his old providers are in the area.   F/u here in 3 weeks   Geroge Baseman. Aydn Ferrara, [facility] [address] [place], [place] [address] [phone]

## 2017-08-01 ENCOUNTER — Ambulatory Visit: Payer: Medicare HMO

## 2017-08-07 ENCOUNTER — Telehealth: Payer: Self-pay | Admitting: Pharmacist Clinician (PhC)/ Clinical Pharmacy Specialist

## 2017-08-07 NOTE — Telephone Encounter (Signed)
Glenn [name] missed the last appt with [place] last week so we can check any bleeding issue due to interaction with apixaban and get labs. He will come in next week.

## 2017-08-07 NOTE — Telephone Encounter (Signed)
Thank you, Minh.

## 2017-08-14 ENCOUNTER — Ambulatory Visit: Payer: Medicare HMO

## 2017-08-15 ENCOUNTER — Ambulatory Visit (HOSPITAL_COMMUNITY): Payer: Medicare HMO | Admitting: Nurse Practitioner

## 2017-08-15 ENCOUNTER — Other Ambulatory Visit: Payer: Self-pay

## 2017-08-15 ENCOUNTER — Other Ambulatory Visit (HOSPITAL_COMMUNITY): Payer: Self-pay | Admitting: *Deleted

## 2017-08-15 MED ORDER — FUROSEMIDE 40 MG PO TABS: 40.0000 mg | ORAL_TABLET | Freq: Every day | ORAL | 6 refills | Status: DC

## 2017-08-21 ENCOUNTER — Ambulatory Visit (HOSPITAL_COMMUNITY): Payer: Medicare HMO | Admitting: Nurse Practitioner

## 2017-08-23 ENCOUNTER — Ambulatory Visit: Payer: Medicare HMO

## 2017-09-02 ENCOUNTER — Ambulatory Visit: Payer: Medicare HMO

## 2017-09-20 ENCOUNTER — Ambulatory Visit (HOSPITAL_COMMUNITY)
Admission: RE | Admit: 2017-09-20 | Discharge: 2017-09-20 | Disposition: A | Discharge status: Home / Self Care | Payer: Medicare HMO | Source: Ambulatory Visit | Attending: Nurse Practitioner | Admitting: Nurse Practitioner

## 2017-09-20 ENCOUNTER — Encounter (HOSPITAL_COMMUNITY): Payer: Self-pay | Admitting: Nurse Practitioner

## 2017-09-20 VITALS — BP 164/82 | HR 97 | Ht 67.0 in | Wt 181.0 lb

## 2017-09-20 DIAGNOSIS — Z8619 Personal history of other infectious and parasitic diseases: Secondary | ICD-10-CM | POA: Diagnosis not present

## 2017-09-20 DIAGNOSIS — Z79899 Other long term (current) drug therapy: Secondary | ICD-10-CM | POA: Insufficient documentation

## 2017-09-20 DIAGNOSIS — F17210 Nicotine dependence, cigarettes, uncomplicated: Secondary | ICD-10-CM | POA: Diagnosis not present

## 2017-09-20 DIAGNOSIS — Z7901 Long term (current) use of anticoagulants: Secondary | ICD-10-CM | POA: Insufficient documentation

## 2017-09-20 DIAGNOSIS — I110 Hypertensive heart disease with heart failure: Secondary | ICD-10-CM | POA: Diagnosis not present

## 2017-09-20 DIAGNOSIS — Z95810 Presence of automatic (implantable) cardiac defibrillator: Secondary | ICD-10-CM | POA: Insufficient documentation

## 2017-09-20 DIAGNOSIS — I509 Heart failure, unspecified: Secondary | ICD-10-CM | POA: Diagnosis not present

## 2017-09-20 DIAGNOSIS — Z8673 Personal history of transient ischemic attack (TIA), and cerebral infarction without residual deficits: Secondary | ICD-10-CM | POA: Insufficient documentation

## 2017-09-20 DIAGNOSIS — I4819 Other persistent atrial fibrillation: Secondary | ICD-10-CM

## 2017-09-20 DIAGNOSIS — I481 Persistent atrial fibrillation: Secondary | ICD-10-CM | POA: Insufficient documentation

## 2017-09-20 LAB — CBC
HEMATOCRIT: 38.8 % — AB (ref 39.0–52.0)
Hemoglobin: 12.8 g/dL — ABNORMAL LOW (ref 13.0–17.0)
MCH: 32.2 pg (ref 26.0–34.0)
MCHC: 33 g/dL (ref 30.0–36.0)
MCV: 97.7 fL (ref 78.0–100.0)
Platelets: 173 10*3/uL (ref 150–400)
RBC: 3.97 MIL/uL — ABNORMAL LOW (ref 4.22–5.81)
RDW: 12.5 % (ref 11.5–15.5)
WBC: 4.3 10*3/uL (ref 4.0–10.5)

## 2017-09-20 LAB — MAGNESIUM: Magnesium: 1.8 mg/dL (ref 1.7–2.4)

## 2017-09-20 LAB — BASIC METABOLIC PANEL
ANION GAP: 7 (ref 5–15)
BUN: 11 mg/dL (ref 8–23)
CALCIUM: 9.1 mg/dL (ref 8.9–10.3)
CO2: 24 mmol/L (ref 22–32)
CREATININE: 1.22 mg/dL (ref 0.61–1.24)
Chloride: 109 mmol/L (ref 98–111)
GFR calc Af Amer: >60 mL/min (ref >60)
GLUCOSE: 112 mg/dL — AB (ref 70–99)
Potassium: 4 mmol/L (ref 3.5–5.1)
Sodium: 140 mmol/L (ref 135–145)

## 2017-09-20 NOTE — Progress Notes (Signed)
Primary Care Physician: [name], MD Referring Physician: Dr. Demetrius Charity [name] is a 67 y.o. male with a h/o afib on Xarelto with ICD, CHF, hepatitis C and hypertension who presented to [facility] 09/05/2016, with left MCA CVA.He did not receive TPA due to arriving outside the treatment window and being on Xarelto. He underwent chemical thrombectomy for left M2 occlusion. Patient was switched to Eliquis for secondary stroke prevention and aspirin 81 mg was added given his cardiac history. 2-D echo in the hospital LVEF 30-35%.   Patient moved here from [place] in May. He has a 2 year history of Atrial fibrillation and had an ICD-[employer] 12/2015  implanted.Says he had a heart cath that showed no blockages but he has h/o CHF. Smokes 1 ppd 30-40 yrs. Says he had chest tightness and shortness of breath for about a week prior to CVA. Also missed a couple of Xarelto doses.   Patient saw Dr. [name] as a new patient 09/19/16. Device interrogation demonstrated rapid atrial rates with averages over 100 bpm. He also had acute on chronic CHF. Dr. [name] felt the atrial fibrillation was likely contributing to his heart failure. Low-dose diltiazem was added despite the fact that he had CHF,based on the presumption that his atrial fibrillation rate was contributing to his heart failure. He also wanted to attempt cardioversion . His LA size has discordant measurements. He felt if he could maintain sinus rhythm and potentially dofetilide or amiodarone might be beneficial. Lasix was increased to 40 mg daily.   He underwent successful cardioversion 10/10/16.  In the ED 02/14/17 with left chest and left shoulder pain that was reproducible to touch.  He was discharged on tramadol.   Patient was seen by [name] 03/19/17.  He had stopped his lisinopril for 2 months because it was making him feel dizzy but he started it back 3 days ago taking it at night.  He says this has helped.  Blood pressure was  quite low when he arrived at 78/46 but on recheck it was 98/46.  He also says his heart is skipping and racing especially at night when he lays down.  His breathing is stable.  Complaining of chronic aching pain in his shoulder and neck.   He said his tramadol made him feel like a zombie and he cannot tolerate it. Dr. [name] consulted re pt and  did discuss possible dofetilide or amiodarone.  Pt  in the afib clinic, 03/25/17, for discussion of admission for Tikosyn. Pt has medicare/medicaid and cost should not be a problem. Specifics re Tikosyn discussed with pt, especially that he has to take specifically as written and can not miss doses, f/u is very important as scheduled with labs/ekg's. He would like to proceed. PharmD has reviewed drugs and no contraindicated or QTc prolonging medications. Denies any benadryl use, no missed doses of eliquis.    F/u hospitalization [facility] 2/4 thru 2/7 for tikosyn loading. He had successful cardioversion and left the hospital in NSR with stable qtc. He is taking Tikosyn correctly and feels improved. He has noted some issues with erectile dysfunction.  F/u in afib clinic, 07/25/17 pt is in afib. He felt some irregular HB yesterday. Cardizem was stopped in March with Dr. [name] 2/2 fatigue, with  HR in the low 50's. Pt is planning to leave for [place] for 2-3 weeks this weekend. Will place back on cardizem as he has RVR today.   Return to [place] 8/1. Ekg shows a  fib with occasional v paced complexes. He feels pretty good.  [name] with BS interrogated device, he does not have an atrial lead which would reveal more info  but by other markers, [name] feels he has been persistent for a while. Pt states that he is compliant with Tikosyn and eliquis. Will schedule  for cardioversion. HE continues to smoke. Drinks 2 beers 2x a week.  Today, he denies symptoms of palpitations, chest pain, orthopnea, PND, lower extremity edema, dizziness, presyncope, syncope, or neurologic sequela. + for  fatigue and shortness of breath with afib. The patient is tolerating medications without difficulties and is otherwise without complaint today.   Past Medical History:  Diagnosis Date  . Atrial fibrillation ([place])   . CHF (congestive heart failure) ([place])   . Hepatitis C   . Hypertension   . Stroke ([place])   . Visit for monitoring Tikosyn therapy 03/26/2017   Past Surgical History:  Procedure Laterality Date  . CARDIAC DEFIBRILLATOR PLACEMENT  2015  . CARDIOVERSION N/A 10/10/2016   Procedure: CARDIOVERSION;  Surgeon: Dorothy Spark, MD;  Location: [facility] ENDOSCOPY;  Service: Cardiovascular;  Laterality: N/A;  . CARDIOVERSION N/A 03/27/2017   Procedure: CARDIOVERSION;  Surgeon: Jerline Pain, MD;  Location: [place];  Service: Cardiovascular;  Laterality: N/A;  . EYE SURGERY Left 1990  . IR PERCUTANEOUS ART THROMBECTOMY/INFUSION INTRACRANIAL INC DIAG ANGIO  09/05/2016  . IR RADIOLOGIST EVAL & MGMT  10/03/2016  . RADIOLOGY WITH ANESTHESIA N/A 09/05/2016   Procedure: RADIOLOGY WITH ANESTHESIA;  Surgeon: Luanne Bras, MD;  Location: [place];  Service: Radiology;  Laterality: N/A;    Current Outpatient Medications  Medication Sig Dispense Refill  . apixaban (ELIQUIS) 5 MG TABS tablet Take 1 tablet (5 mg total) by mouth 2 (two) times daily. 60 tablet 9  . busPIRone (BUSPAR) 7.5 MG tablet Take 7.5 mg by mouth daily as needed for depression.    . carvedilol (COREG) 25 MG tablet Take 1 tablet (25 mg total) by mouth 2 (two) times daily with a meal. 60 tablet 6  . diclofenac sodium (VOLTAREN) 1 % GEL Apply to affected area as needed twice daily. 100 g 1  . diltiazem (CARDIZEM CD) 120 MG 24 hr capsule Take 1 capsule (120 mg total) by mouth at bedtime. 30 capsule 3  . dofetilide (TIKOSYN) 500 MCG capsule Take 1 capsule (500 mcg total) by mouth 2 (two) times daily. 60 capsule 6  . furosemide (LASIX) 40 MG tablet Take 1 tablet (40 mg total) by mouth daily. 30 tablet 6  . hydrALAZINE (APRESOLINE) 25  MG tablet Take 1 tablet (25 mg total) by mouth 2 (two) times daily. 60 tablet 11  . magnesium oxide (MAG-OX) 400 (241.3 Mg) MG tablet Take 1 tablet (400 mg total) by mouth 2 (two) times daily. (Patient taking differently: Take 400 mg by mouth daily as needed. ) 60 tablet 6  . potassium chloride SA (K-DUR,KLOR-CON) 20 MEQ tablet Take 1 tablet (20 mEq total) by mouth daily. 90 tablet 3  . Glecaprevir-Pibrentasvir (MAVYRET) 100-40 MG TABS Take 3 tablets by mouth daily with supper. (Patient not taking: Reported on 09/20/2017) 84 tablet 1   No current facility-administered medications for this encounter.     No Known Allergies  Social History   Socioeconomic History  . Marital status: Single    Spouse name: Not on file  . Number of children: Not on file  . Years of education: 43 (some college)  . Highest education level: Not on  file  Occupational History  . Occupation: disability  Social Needs  . Financial resource strain: Not on file  . Food insecurity:    Worry: Not on file    Inability: Not on file  . Transportation needs:    Medical: Not on file    Non-medical: Not on file  Tobacco Use  . Smoking status: Current Some Day Smoker    Packs/day: 0.50    Types: Cigarettes  . Smokeless tobacco: Never Used  . Tobacco comment: smoke 2 packs a week  Substance and Sexual Activity  . Alcohol use: Yes    Alcohol/week: 1.8 oz    Types: 3 Cans of beer per week    Comment: weekends, a couple   . Drug use: Yes    Frequency: 2.0 times per week    Types: Marijuana    Comment: for pain and leisure,occasionally  . Sexual activity: Yes    Partners: Female    Birth control/protection: Condom  Lifestyle  . Physical activity:    Days per week: Not on file    Minutes per session: Not on file  . Stress: Not on file  Relationships  . Social connections:    Talks on phone: Not on file    Gets together: Not on file    Attends religious service: Not on file    Active member of club or  organization: Not on file    Attends meetings of clubs or organizations: Not on file    Relationship status: Not on file  . Intimate partner violence:    Fear of current or ex partner: Not on file    Emotionally abused: Not on file    Physically abused: Not on file    Forced sexual activity: Not on file  Other Topics Concern  . Not on file  Social History Narrative  . Not on file    Family History  Problem Relation Age of Onset  . Stroke Maternal Aunt   . Heart disease Neg Hx     ROS- All systems are reviewed and negative except as per the HPI above  Physical Exam: Vitals:   09/20/17 0949  BP: (!) 164/82  Pulse: 97  Weight: 181 lb (82.1 kg)  Height: 5\' 7"  (1.702 m)   Wt Readings from Last 3 Encounters:  09/20/17 181 lb (82.1 kg)  07/25/17 184 lb (83.5 kg)  05/31/17 182 lb 9.6 oz (82.8 kg)    Labs: Lab Results  Component Value Date   NA 140 09/20/2017   K 4.0 09/20/2017   CL 109 09/20/2017   CO2 24 09/20/2017   GLUCOSE 112 (H) 09/20/2017   BUN 11 09/20/2017   CREATININE 1.22 09/20/2017   CALCIUM 9.1 09/20/2017   PHOS 2.7 09/07/2016   MG 1.8 09/20/2017   Lab Results  Component Value Date   INR 1.0 05/21/2017   Lab Results  Component Value Date   CHOL 96 09/06/2016   HDL 26 (L) 09/06/2016   LDLCALC 44 09/06/2016   TRIG 128 09/06/2016     GEN- The patient is well appearing, alert and oriented x 3 today.   Head- normocephalic, atraumatic Eyes-  Sclera clear, conjunctiva pink Ears- hearing intact Oropharynx- clear Neck- supple, no JVP Lymph- no cervical lymphadenopathy Lungs- Clear to ausculation bilaterally, normal work of breathing Heart-irregular rate and rhythm, no murmurs, rubs or gallops, PMI not laterally displaced GI- soft, NT, ND, + BS Extremities- no clubbing, cyanosis, or edema MS- no significant deformity or atrophy  Skin- no rash or lesion Psych- euthymic mood, full affect Neuro- strength and sensation are intact  EKG-  afib at 97  bpm, qtc 408 ms Epic records reviewed Echo-Study Conclusions  - Left ventricle: The cavity size was normal. Systolic function was   moderately to severely reduced. The estimated ejection fraction   was in the range of 30% to 35%. Diffuse hypokinesis. Moderate   hypokinesis of the anteroseptal, inferior, and inferoseptal   myocardium. - Aortic valve: Transvalvular velocity was within the normal range.   There was no stenosis. There was moderate regurgitation. Valve   area (VTI): 2.25 cm^2. Valve area (Vmax): 2.37 cm^2. Valve area   (Vmean): 2.34 cm^2. - Mitral valve: Transvalvular velocity was within the normal range.   There was no evidence for stenosis. There was mild regurgitation. - Left atrium: The atrium was moderately dilated. - Right ventricle: The cavity size was normal. Wall thickness was   normal. Systolic function was mildly reduced. - Right atrium: The atrium was moderately dilated. - Tricuspid valve: There was mild-moderate regurgitation. - Pulmonary arteries: Systolic pressure was mildly increased. PA   peak pressure: 38 mm Hg (S).   Assessment and Plan: 1. Persistent afib   Per interrogation afib appears to have been persistent for a while. He was in afib when he was seen in June before going to the  [place] area for awhile. States no  change in his health, no obvious triggers, but he does continue to drink alcohol and smoke both which could increase burden Continue tikosyn at 500 mcg bid, pt reports compliant Continue cardizem 120 mg qd Continue to take eliquis 5 mg bid, pt reports no missed doses for at least 3 weeks Will schedule cardioversion Bmet/mag/cbc today   F/u with Dr. Jens Som to evaluate for rhythm  and if ERAF, consideration for stopping Tikosyn, or referring for ablation    Glenn [name], [facility] [address] [place], [place] [address] [phone]

## 2017-09-20 NOTE — Patient Instructions (Addendum)
Cardioversion scheduled for Thursday, August 8th  - Arrive at the [facility] and go to admitting at [facility] not eat or drink anything after midnight the night prior to your procedure.  - Take all your morning medication with a sip of water prior to arrival.  - You will not be able to drive home after your procedure.  Glenn [name] will call with appointment for follow up with Dr. [name] after cardioversion

## 2017-09-22 ENCOUNTER — Other Ambulatory Visit: Payer: Self-pay | Admitting: Internal Medicine

## 2017-09-23 ENCOUNTER — Other Ambulatory Visit: Payer: Self-pay

## 2017-09-23 ENCOUNTER — Encounter (HOSPITAL_COMMUNITY): Payer: Self-pay | Admitting: Emergency Medicine

## 2017-09-23 ENCOUNTER — Emergency Department (HOSPITAL_COMMUNITY): Payer: Medicare HMO

## 2017-09-23 ENCOUNTER — Emergency Department (HOSPITAL_COMMUNITY)
Admission: EM | Admit: 2017-09-23 | Discharge: 2017-09-23 | Disposition: A | Discharge status: Home / Self Care | Payer: Medicare HMO | Attending: Emergency Medicine | Admitting: Emergency Medicine

## 2017-09-23 DIAGNOSIS — Y929 Unspecified place or not applicable: Secondary | ICD-10-CM | POA: Insufficient documentation

## 2017-09-23 DIAGNOSIS — Y999 Unspecified external cause status: Secondary | ICD-10-CM | POA: Insufficient documentation

## 2017-09-23 DIAGNOSIS — X58XXXA Exposure to other specified factors, initial encounter: Secondary | ICD-10-CM | POA: Diagnosis not present

## 2017-09-23 DIAGNOSIS — S52501A Unspecified fracture of the lower end of right radius, initial encounter for closed fracture: Secondary | ICD-10-CM | POA: Diagnosis present

## 2017-09-23 DIAGNOSIS — Z79899 Other long term (current) drug therapy: Secondary | ICD-10-CM | POA: Diagnosis not present

## 2017-09-23 DIAGNOSIS — I509 Heart failure, unspecified: Secondary | ICD-10-CM | POA: Insufficient documentation

## 2017-09-23 DIAGNOSIS — Y939 Activity, unspecified: Secondary | ICD-10-CM | POA: Diagnosis not present

## 2017-09-23 DIAGNOSIS — I110 Hypertensive heart disease with heart failure: Secondary | ICD-10-CM | POA: Insufficient documentation

## 2017-09-23 DIAGNOSIS — F17210 Nicotine dependence, cigarettes, uncomplicated: Secondary | ICD-10-CM | POA: Insufficient documentation

## 2017-09-23 MED ORDER — HYDROCODONE-ACETAMINOPHEN 5-325 MG PO TABS: 1.0000 | ORAL_TABLET | Freq: Four times a day (QID) | ORAL | 0 refills | Status: AC | PRN

## 2017-09-23 MED ORDER — HYDROCODONE-ACETAMINOPHEN 5-325 MG PO TABS
1.0000 | ORAL_TABLET | Freq: Once | ORAL | Status: AC
  Administered 2017-09-23: 1 via ORAL
  Filled 2017-09-23: qty 1

## 2017-09-23 NOTE — ED Triage Notes (Signed)
Pt. Stated, I broke my arm on June 30 and it got wet and I took the cast off last night and its really painful. Took it off with a razor.

## 2017-09-23 NOTE — Progress Notes (Signed)
Orthopedic Tech Progress Note Patient Details:  Glenn [name] 1950-10-23 [id number]  Ortho Devices Type of Ortho Device: Ace wrap, Arm sling, Sugartong splint Ortho Device/Splint Interventions: Application   Post Interventions Patient Tolerated: Well Instructions Provided: Care of device   [name] 09/23/2017, 4:33 PM

## 2017-09-23 NOTE — ED Triage Notes (Signed)
Ortho tech paged  

## 2017-09-23 NOTE — ED Notes (Signed)
Declined W/C at D/C and was escorted to lobby by RN. 

## 2017-09-23 NOTE — ED Provider Notes (Signed)
[facility] EMERGENCY DEPARTMENT Provider Note   CSN: [id number] Arrival date & time: 09/23/17  1143     History   Chief Complaint Chief Complaint  Patient presents with  . Arm Pain    HPI Scotty Weigelt is a 67 y.o. male.  67 y/o male with a PMH of aFib presents to the ED with a chief complaint of left wrist pain that began last night. Patient fell while playing with his grandson on June 30th, 2019 in [place]. He was seen in the ED and placed on a cast. He states yesterday during the rain the cast got wet and he felt it was itching so he removed it with a razor blade. He states since the pain is mainly on the dorsum part of his hand and radiates to his fingers. He has not tried taking anything for the pain and states he did not follow up with anyone after his fall. He denies any other complaints, chest pain or shortness of breath.      Past Medical History:  Diagnosis Date  . Atrial fibrillation ([place])   . CHF (congestive heart failure) ([place])   . Hepatitis C   . Hypertension   . Stroke ([place])   . Visit for monitoring Tikosyn therapy 03/26/2017    Patient Active Problem List   Diagnosis Date Noted  . Solitary pulmonary nodule 06/10/2017  . Neck pain 04/06/2017  . Paroxysmal atrial fibrillation ([name])   . Visit for monitoring Tikosyn therapy 03/25/2017  . ICD (implantable cardioverter-defibrillator) in place 09/13/2016  . Chest pain 09/13/2016  . Tobacco abuse 09/13/2016  . Hospital discharge follow-up 09/13/2016  . Upper back pain 09/13/2016  . Housing problems 09/13/2016  . Chronic viral hepatitis C ([place]) 09/13/2016  . Persistent atrial fibrillation ([place])   . Atrial fibrillation with RVR ([place])   . Ischemic cardiomyopathy   . Cerebral infarction ([place])   . Stroke (cerebrum) ([place]) 09/05/2016  . CHF (congestive heart failure) ([place]) 08/14/2016  . HTN (hypertension) 08/14/2016  . Chronic Hepatitis C  08/14/2016    Past Surgical History:  Procedure  Laterality Date  . CARDIAC DEFIBRILLATOR PLACEMENT  2015  . CARDIOVERSION N/A 10/10/2016   Procedure: CARDIOVERSION;  Surgeon: Dorothy Spark, MD;  Location: [facility] ENDOSCOPY;  Service: Cardiovascular;  Laterality: N/A;  . CARDIOVERSION N/A 03/27/2017   Procedure: CARDIOVERSION;  Surgeon: Jerline Pain, MD;  Location: [facility];  Service: Cardiovascular;  Laterality: N/A;  . EYE SURGERY Left 1990  . IR PERCUTANEOUS ART THROMBECTOMY/INFUSION INTRACRANIAL INC DIAG ANGIO  09/05/2016  . IR RADIOLOGIST EVAL & MGMT  10/03/2016  . RADIOLOGY WITH ANESTHESIA N/A 09/05/2016   Procedure: RADIOLOGY WITH ANESTHESIA;  Surgeon: Luanne Bras, MD;  Location: [facility];  Service: Radiology;  Laterality: N/A;        Home Medications    Prior to Admission medications   Medication Sig Start Date End Date Taking? Authorizing Provider  apixaban (ELIQUIS) 5 MG TABS tablet Take 1 tablet (5 mg total) by mouth 2 (two) times daily. 07/23/17   [name], MD  busPIRone (BUSPAR) 7.5 MG tablet Take 7.5 mg by mouth daily as needed for depression. 07/09/16   [provider]  carvedilol (COREG) 25 MG tablet Take 1 tablet (25 mg total) by mouth 2 (two) times daily with a meal. 07/23/17   Mikell, Jeani Sow, MD  Cyanocobalamin (B-12) 500 MCG TABS Take 500 mcg by mouth daily as needed (energy).    [provider]  diclofenac sodium (VOLTAREN) 1 % GEL Apply to affected area as needed twice daily. Patient taking differently: Apply 1 application topically 2 (two) times daily as needed (pain).  09/11/16   Rogue Bussing, MD  diltiazem (CARDIZEM CD) 120 MG 24 hr capsule Take 1 capsule (120 mg total) by mouth at bedtime. Patient not taking: Reported on 09/23/2017 07/25/17 07/25/18  Sherran Needs, NP  dofetilide (TIKOSYN) 500 MCG capsule Take 1 capsule (500 mcg total) by mouth 2 (two) times daily. 03/28/17   Baldwin Jamaica, PA-C  furosemide (LASIX) 40 MG tablet Take 1 tablet (40 mg total) by mouth daily.  08/15/17   Sherran Needs, NP  Glecaprevir-Pibrentasvir (MAVYRET) 100-40 MG TABS Take 3 tablets by mouth daily with supper. Patient not taking: Reported on 09/20/2017 06/10/17   [place] Callas, NP  hydrALAZINE (APRESOLINE) 25 MG tablet Take 1 tablet (25 mg total) by mouth 2 (two) times daily. Patient not taking: Reported on 09/23/2017 10/08/16   Imogene Burn, PA-C  HYDROcodone-acetaminophen (NORCO/VICODIN) 5-325 MG tablet Take 1 tablet by mouth every 6 (six) hours as needed for up to 3 days. 09/23/17 09/26/17  Janeece Fitting, PA-C  magnesium oxide (MAG-OX) 400 (241.3 Mg) MG tablet Take 1 tablet (400 mg total) by mouth 2 (two) times daily. Patient taking differently: Take 400 mg by mouth daily.  07/25/17   Sherran Needs, NP  potassium chloride SA (K-DUR,KLOR-CON) 20 MEQ tablet Take 1 tablet (20 mEq total) by mouth daily. 10/09/16   Imogene Burn, PA-C    Family History Family History  Problem Relation Age of Onset  . Stroke Maternal Aunt   . Heart disease Neg Hx     Social History Social History   Tobacco Use  . Smoking status: Current Some Day Smoker    Packs/day: 0.50    Types: Cigarettes  . Smokeless tobacco: Never Used  . Tobacco comment: smoke 2 packs a week  Substance Use Topics  . Alcohol use: Yes    Alcohol/week: 1.8 oz    Types: 3 Cans of beer per week    Comment: weekends, a couple   . Drug use: Yes    Frequency: 2.0 times per week    Types: Marijuana    Comment: for pain and leisure,occasionally     Allergies   Benadryl [diphenhydramine]   Review of Systems Review of Systems  Constitutional: Negative for chills and fever.  HENT: Negative for ear pain and sore throat.   Eyes: Negative for pain and visual disturbance.  Respiratory: Negative for cough and shortness of breath.   Cardiovascular: Negative for chest pain and palpitations.  Gastrointestinal: Negative for abdominal pain and vomiting.  Genitourinary: Negative for dysuria and hematuria.    Musculoskeletal: Positive for arthralgias. Negative for back pain, gait problem and myalgias.  Skin: Negative for color change and rash.  Neurological: Negative for seizures and syncope.  All other systems reviewed and are negative.    Physical Exam Updated Vital Signs BP (!) 159/69   Pulse 89   Temp 98.6 F (37 C) (Oral)   Resp 18   Ht 5\' 7"  (1.702 m)   Wt 82.1 kg (181 lb)   BMI 28.35 kg/m   Physical Exam  Constitutional: He is oriented to person, place, and time. He appears well-developed and well-nourished.  HENT:  Head: Normocephalic and atraumatic.  Mouth/Throat: Oropharynx is clear and moist.  Eyes: Pupils are equal, round, and reactive to light. No scleral icterus.  Neck:  Normal range of motion.  Cardiovascular: Normal heart sounds.  Pulses:      Radial pulses are 2+ on the right side, and 2+ on the left side.  Radial pulse present BL. Median, radial, ulnar nerve intact.   Pulmonary/Chest: Effort normal and breath sounds normal. He has no wheezes. He exhibits no tenderness.  Abdominal: Soft. Bowel sounds are normal. He exhibits no distension. There is no tenderness.  Musculoskeletal: He exhibits no tenderness or deformity.  Neurological: He is alert and oriented to person, place, and time.  Skin: Skin is warm and dry.  Nursing note reviewed.    ED Treatments / Results  Labs (all labs ordered are listed, but only abnormal results are displayed) Labs Reviewed - No data to display  EKG None  Radiology Dg Wrist Complete Left  Result Date: 09/23/2017 CLINICAL DATA:  Subacute wrist fracture. EXAM: LEFT WRIST - COMPLETE 3+ VIEW COMPARISON:  None. FINDINGS: Healing fracture along the radial metaphysis. Fracture parallel to the articular surface. Remote fracture of the ulnar styloid. No carpal fracture identified. Radiocarpal joint is intact. Normal alignment. IMPRESSION: Healing fracture of the distal radius.  Normal alignment. Electronically Signed   By: Suzy Bouchard M.D.   On: 09/23/2017 15:23    Procedures Procedures (including critical care time)  Medications Ordered in ED Medications  HYDROcodone-acetaminophen (NORCO/VICODIN) 5-325 MG per tablet 1 tablet (1 tablet Oral Given 09/23/17 1424)     Initial Impression / Assessment and Plan / ED Course  I have reviewed the triage vital signs and the nursing notes.  Pertinent labs & imaging results that were available during my care of the patient were reviewed by me and considered in my medical decision making (see chart for details).     DG Wrist complete showed a healing fracture of the distal radius. Patient has not followed up for his fracture.Pulses are present.Median,Ulnar and radial nerve are intact. At this time I believe patient needs to be placed on immobilization and pain control.I have provided a referral for follow up with a hand specialist.Return precautions provided.   Final Clinical Impressions(s) / ED Diagnoses   Final diagnoses:  Closed fracture of distal end of right radius, unspecified fracture morphology, initial encounter    ED Discharge Orders        Ordered    HYDROcodone-acetaminophen (NORCO/VICODIN) 5-325 MG tablet  Every 6 hours PRN     09/23/17 1543       Janeece Fitting, PA-C 09/23/17 1636    Nat Christen, MD 09/25/17 1100

## 2017-09-23 NOTE — Discharge Instructions (Addendum)
I have provided medication for pain, please only take for severe pain.Do not drink or drive while taking this medication.I have also provided a referral for a hand specialist, please schedule an appointment for re evalevaluation of symptoms. Please return to the ED if you experience any: Chest pain, or shortness of breath.

## 2017-09-25 ENCOUNTER — Other Ambulatory Visit (HOSPITAL_COMMUNITY): Payer: Self-pay | Admitting: Nurse Practitioner

## 2017-09-26 ENCOUNTER — Ambulatory Visit (HOSPITAL_COMMUNITY)
Admission: RE | Admit: 2017-09-26 | Discharge: 2017-09-26 | Disposition: A | Discharge status: Home / Self Care | Payer: Medicare HMO | Source: Ambulatory Visit | Attending: Cardiology | Admitting: Cardiology

## 2017-09-26 ENCOUNTER — Encounter (HOSPITAL_COMMUNITY)
Admission: RE | Disposition: A | Discharge status: Home / Self Care | Payer: Self-pay | Source: Ambulatory Visit | Attending: Cardiology

## 2017-09-26 ENCOUNTER — Encounter (HOSPITAL_COMMUNITY): Payer: Self-pay | Admitting: Certified Registered"

## 2017-09-26 ENCOUNTER — Encounter (HOSPITAL_COMMUNITY): Payer: Self-pay | Admitting: *Deleted

## 2017-09-26 DIAGNOSIS — R001 Bradycardia, unspecified: Secondary | ICD-10-CM | POA: Insufficient documentation

## 2017-09-26 DIAGNOSIS — Z5309 Procedure and treatment not carried out because of other contraindication: Secondary | ICD-10-CM | POA: Diagnosis not present

## 2017-09-26 DIAGNOSIS — I480 Paroxysmal atrial fibrillation: Secondary | ICD-10-CM

## 2017-09-26 SURGERY — CANCELLED PROCEDURE

## 2017-09-26 NOTE — Anesthesia Preprocedure Evaluation (Deleted)
Anesthesia Evaluation    Reviewed: Allergy & Precautions, Patient's Chart, lab work & pertinent test results  History of Anesthesia Complications Negative for: history of anesthetic complications  Airway        Dental   Pulmonary Current Smoker,           Cardiovascular hypertension, Pt. on home beta blockers and Pt. on medications +CHF  + dysrhythmias Atrial Fibrillation + Cardiac Defibrillator    '18 TTE - EF 30% to 35%. Diffuse hypokinesis. Moderate   hypokinesis of the anteroseptal, inferior, and inferoseptal myocardium. Moderate AI. Mild MR. Left atrium was moderately dilated. RV systolic function was mildly reduced. Right atrium was moderately dilated. Mild-moderate TR. PASP was mildly increased, 38 mmHg.    Neuro/Psych CVA negative psych ROS   GI/Hepatic negative GI ROS, (+) Hepatitis -, C  Endo/Other  negative endocrine ROS  Renal/GU negative Renal ROS  negative genitourinary   Musculoskeletal negative musculoskeletal ROS (+)   Abdominal   Peds  Hematology  (+) anemia ,   Anesthesia Other Findings   Reproductive/Obstetrics                             Anesthesia Physical Anesthesia Plan  ASA: III  Anesthesia Plan: General   Post-op Pain Management:    Induction: Intravenous  PONV Risk Score and Plan: Treatment may vary due to age or medical condition and Propofol infusion  Airway Management Planned: Natural Airway and Mask  Additional Equipment: None  Intra-op Plan:   Post-operative Plan:   Informed Consent:   Plan Discussed with: CRNA and Anesthesiologist  Anesthesia Plan Comments:         Anesthesia Quick Evaluation

## 2017-09-26 NOTE — Progress Notes (Signed)
Patient admitted to Endo for CV. Patient placed on monitor and appeared to be in sinus brady. EKG and MD confirmed sinus bradycardia and patient was discharged home prior to entering procedure room.

## 2017-09-26 NOTE — H&P (Signed)
    Patient was in sinus bradycardia.  No cardioversion performed. Candee Furbish, MD

## 2017-09-26 NOTE — Progress Notes (Signed)
   Pt. In sinus brady 49BPM.  No need for conservation.   Discussed with patient.   Candee Furbish, MD

## 2017-09-27 ENCOUNTER — Ambulatory Visit (INDEPENDENT_AMBULATORY_CARE_PROVIDER_SITE_OTHER): Payer: Medicare HMO | Admitting: Family Medicine

## 2017-09-27 VITALS — BP 140/78 | HR 63 | Temp 98.3°F | Wt 179.2 lb

## 2017-09-27 DIAGNOSIS — N529 Male erectile dysfunction, unspecified: Secondary | ICD-10-CM

## 2017-09-27 DIAGNOSIS — I480 Paroxysmal atrial fibrillation: Secondary | ICD-10-CM

## 2017-09-27 MED ORDER — HYDRALAZINE HCL 25 MG PO TABS: 25.0000 mg | ORAL_TABLET | Freq: Two times a day (BID) | ORAL | 11 refills | Status: DC

## 2017-09-27 MED ORDER — B-12 500 MCG PO TABS: 500.0000 ug | ORAL_TABLET | Freq: Every day | ORAL | 2 refills | Status: DC | PRN

## 2017-09-27 MED ORDER — POTASSIUM CHLORIDE CRYS ER 20 MEQ PO TBCR: 20.0000 meq | EXTENDED_RELEASE_TABLET | Freq: Every day | ORAL | 3 refills | Status: DC

## 2017-09-27 MED ORDER — DIAZEPAM 2 MG PO TABS: 2.0000 mg | ORAL_TABLET | Freq: Four times a day (QID) | ORAL | 0 refills | Status: DC | PRN

## 2017-09-27 NOTE — Patient Instructions (Signed)
I would like to check a couple of labs to evaluate you for erectile dysfunction. I will call you with these results. I refilled your b12, hydralazine, kdur, and valium. I am glad things have been going well with your heart since you were in the hospital.

## 2017-09-28 LAB — TESTOSTERONE: Testosterone: 476 ng/dL (ref 264–916)

## 2017-09-30 ENCOUNTER — Encounter: Payer: Self-pay | Admitting: Family Medicine

## 2017-09-30 DIAGNOSIS — N529 Male erectile dysfunction, unspecified: Secondary | ICD-10-CM | POA: Insufficient documentation

## 2017-09-30 HISTORY — DX: Male erectile dysfunction, unspecified: N52.9

## 2017-09-30 NOTE — Assessment & Plan Note (Signed)
Will get testosterone level as this will be major deciding factor in treatment. Reviewed a1c from 2018 and was well below pre-diabetic level. His CHF could be contributing but this is not a new problem. If testosterone is low, will plan on PDE-5 inhibitor. Can supplement testosterone if low.

## 2017-09-30 NOTE — Progress Notes (Signed)
   HPI 67 year old male who presents for hospital follow up as well as to discuss erectile dysfunction. Patient was seen in the hospital on 8/8 and apparently had a scheduled cardioversion. He was found to be in sinus bradycardia so the procedure was aborted. He has not had any symptoms of an abnormal heart rhythm since that time.  The patient reports that over the last few months he has had increasing symptoms of erectile dysfunction. He states that he has been having a "hard time getting it up". He has not pursued sexual opportunities as he has "not wanted to embarrass" himself.  CC: follow up, erectile dysfunction   ROS:   Review of Systems See HPI for ROS.   CC, SH/smoking status, and VS noted  Objective: BP 140/78 (BP Location: Left Arm, Patient Position: Sitting, Cuff Size: Normal)   Pulse 63   Temp 98.3 F (36.8 C) (Oral)   Wt 179 lb 3.2 oz (81.3 kg)   SpO2 98%   BMI 28.07 kg/m  Gen: NAD, alert, cooperative, and pleasant. AA male in no acute distress HEENT: NCAT, EOMI, PERRL CV: RRR, no murmur Resp: CTAB, no wheezes, non-labored Abd: SNTND, BS present, no guarding or organomegaly Ext: No edema, warm Neuro: Alert and oriented, Speech clear, No gross deficits   Assessment and plan:  Paroxysmal atrial fibrillation ([name]) No longer having any issues with his afib since leaving the hospital. Currently well controlled.  Impotence due to erectile dysfunction Will get testosterone level as this will be major deciding factor in treatment. Reviewed a1c from 2018 and was well below pre-diabetic level. His CHF could be contributing but this is not a new problem. If testosterone is low, will plan on PDE-5 inhibitor. Can supplement testosterone if low.   Orders Placed This Encounter  Procedures  . Testosterone    Meds ordered this encounter  Medications  . Cyanocobalamin (B-12) 500 MCG TABS    Sig: Take 500 mcg by mouth daily as needed (energy).    Dispense:  30 tablet   Refill:  2  . hydrALAZINE (APRESOLINE) 25 MG tablet    Sig: Take 1 tablet (25 mg total) by mouth 2 (two) times daily.    Dispense:  60 tablet    Refill:  11  . potassium chloride SA (K-DUR,KLOR-CON) 20 MEQ tablet    Sig: Take 1 tablet (20 mEq total) by mouth daily.    Dispense:  90 tablet    Refill:  3  . diazepam (VALIUM) 2 MG tablet    Sig: Take 1 tablet (2 mg total) by mouth every 6 (six) hours as needed for anxiety.    Dispense:  30 tablet    Refill:  0     [name] MD PGY-2 Family Medicine Resident  09/30/2017 5:31 PM

## 2017-09-30 NOTE — Assessment & Plan Note (Signed)
No longer having any issues with his afib since leaving the hospital. Currently well controlled.

## 2017-10-07 ENCOUNTER — Ambulatory Visit (INDEPENDENT_AMBULATORY_CARE_PROVIDER_SITE_OTHER): Payer: Medicare HMO | Admitting: *Deleted

## 2017-10-07 DIAGNOSIS — I255 Ischemic cardiomyopathy: Secondary | ICD-10-CM | POA: Diagnosis not present

## 2017-10-08 NOTE — Progress Notes (Signed)
Remote ICD transmission.   

## 2017-10-15 ENCOUNTER — Encounter: Payer: Self-pay | Admitting: Internal Medicine

## 2017-10-15 ENCOUNTER — Ambulatory Visit (INDEPENDENT_AMBULATORY_CARE_PROVIDER_SITE_OTHER): Payer: Medicare HMO | Admitting: Internal Medicine

## 2017-10-15 VITALS — BP 142/74 | HR 54 | Ht 67.0 in | Wt 188.6 lb

## 2017-10-15 DIAGNOSIS — I481 Persistent atrial fibrillation: Secondary | ICD-10-CM | POA: Diagnosis not present

## 2017-10-15 DIAGNOSIS — Z95810 Presence of automatic (implantable) cardiac defibrillator: Secondary | ICD-10-CM | POA: Diagnosis not present

## 2017-10-15 DIAGNOSIS — I255 Ischemic cardiomyopathy: Secondary | ICD-10-CM | POA: Diagnosis not present

## 2017-10-15 DIAGNOSIS — I4819 Other persistent atrial fibrillation: Secondary | ICD-10-CM

## 2017-10-15 NOTE — Progress Notes (Signed)
Patient Care Team: [name], MD as PCP - General (Family Medicine) [name], MD as PCP - Cardiology (Cardiology)   HPI  Glenn [name] is a 67 y.o. male Seen in follow-up for an ICD.  He has a nonischemic cardiomyopathy.  He has a history of atrial fibrillation and interval stroke.  He was treated with chemical thrombectomy.  He is on Eliquis.    Echocardiogram EF 30-35%  He underwent cardioversion 8/18.  He was seen by mL-PA 1/19.  He was noted to be in recurrent atrial fibrillation and was hospitalized 2/19 for dofetilide loading.  Date Cr K Hgb  8/19 1.22 4.0 12.8         Some interval palpitations.  No chest pain.  No edema.  Minimal shortness of breath.  No bleeding.   Records and Results Reviewed   Past Medical History:  Diagnosis Date  . Atrial fibrillation ([place])   . CHF (congestive heart failure) ([place])   . Hepatitis C   . Hypertension   . Stroke ([place])   . Visit for monitoring Tikosyn therapy 03/26/2017    Past Surgical History:  Procedure Laterality Date  . CARDIAC DEFIBRILLATOR PLACEMENT  2015  . CARDIOVERSION N/A 10/10/2016   Procedure: CARDIOVERSION;  Surgeon: Dorothy Spark, MD;  Location[facility] ENDOSCOPY;  Service: Cardiovascular;  Laterality: N/A;  . CARDIOVERSION N/A 03/27/2017   Procedure: CARDIOVERSION;  Surgeon: Jerline Pain, MD;  Location: [facility];  Service: Cardiovascular;  Laterality: N/A;  . EYE SURGERY Left 1990  . IR PERCUTANEOUS ART THROMBECTOMY/INFUSION INTRACRANIAL INC DIAG ANGIO  09/05/2016  . IR RADIOLOGIST EVAL & MGMT  10/03/2016  . RADIOLOGY WITH ANESTHESIA N/A 09/05/2016   Procedure: RADIOLOGY WITH ANESTHESIA;  Surgeon: Luanne Bras, MD;  Location: [facility];  Service: Radiology;  Laterality: N/A;    Current Outpatient Medications  Medication Sig Dispense Refill  . apixaban (ELIQUIS) 5 MG TABS tablet Take 1 tablet (5 mg total) by mouth 2 (two) times daily. 60 tablet 9  . busPIRone (BUSPAR) 7.5 MG  tablet Take 7.5 mg by mouth daily as needed for depression.    . carvedilol (COREG) 25 MG tablet Take 1 tablet (25 mg total) by mouth 2 (two) times daily with a meal. 60 tablet 6  . Cyanocobalamin (B-12) 500 MCG TABS Take 500 mcg by mouth daily as needed (energy). 30 tablet 2  . diazepam (VALIUM) 2 MG tablet Take 1 tablet (2 mg total) by mouth every 6 (six) hours as needed for anxiety. 30 tablet 0  . diltiazem (CARDIZEM CD) 120 MG 24 hr capsule TAKE ONE CAPSULE BY MOUTH AT BEDTIME 90 capsule 0  . dofetilide (TIKOSYN) 500 MCG capsule Take 1 capsule (500 mcg total) by mouth 2 (two) times daily. 60 capsule 6  . furosemide (LASIX) 40 MG tablet Take 1 tablet (40 mg total) by mouth daily. 30 tablet 6  . Glecaprevir-Pibrentasvir (MAVYRET) 100-40 MG TABS Take 3 tablets by mouth daily with supper. 84 tablet 1  . hydrALAZINE (APRESOLINE) 25 MG tablet Take 1 tablet (25 mg total) by mouth 2 (two) times daily. 60 tablet 11  . magnesium oxide (MAG-OX) 400 MG tablet Take 400 mg by mouth daily.    . potassium chloride SA (K-DUR,KLOR-CON) 20 MEQ tablet Take 1 tablet (20 mEq total) by mouth daily. 90 tablet 3   No current facility-administered medications for this visit.     Allergies  Allergen Reactions  . Benadryl [Diphenhydramine] Palpitations  Review of Systems negative except from HPI and PMH  Physical Exam BP (!) 142/74   Pulse (!) 54   Ht 5\' 7"  (1.702 m)   Wt 188 lb 9.6 oz (85.5 kg)   SpO2 96%   BMI 29.54 kg/m   Well developed and nourished in no acute distress HENT normal Neck supple with JVP-flat Clear Regular rate and rhythm, no murmurs or gallops Abd-soft with active BS No Clubbing cyanosis edema Skin-warm and dry A & Oriented  Grossly normal sensory and motor function   ECG sinus at 54 Interval 16/09/49  Assessment and  Plan  Sinus bradycardia  Cardiomyopathy presumed nonischemic question rate related  Atrial fibrillation-long-term persistent  Congestive heart  failure-chronic class IIb  Implantable defibrillator-[employer]  Stroke-recent  Hypertension/hypertensive heart disease  Some intermittent atrial fibrillation.  Difficult to determine burden because of his single-chamber device.  He does have about 10% of his heartbeats faster than 120 bpm suggestive of atrial fibrillation, however, he has few symptoms.  We will continue his current course.  On Anticoagulation;  No bleeding issues   Euvolemic continue current meds  Labs stable and QT ok on dofetilide

## 2017-10-15 NOTE — Patient Instructions (Signed)
Medication Instructions:  Your physician recommends that you continue on your current medications as directed. Please refer to the Current Medication list given to you today.   Labwork: None ordered   Testing/Procedures: None ordered   Follow-Up: Your physician wants you to follow-up in: 6 months with Amber Seiler, NP You will receive a reminder letter in the mail two months in advance. If you don't receive a letter, please call our office to schedule the follow-up appointment.   Any Other Special Instructions Will Be Listed Below (If Applicable).     If you need a refill on your cardiac medications before your next appointment, please call your pharmacy.   

## 2017-10-24 ENCOUNTER — Other Ambulatory Visit: Payer: Self-pay | Admitting: Physician Assistant

## 2017-11-11 ENCOUNTER — Other Ambulatory Visit: Payer: Self-pay | Admitting: Family Medicine

## 2017-11-11 LAB — CUP PACEART REMOTE DEVICE CHECK
Date Time Interrogation Session: 20190923143324
Implantable Lead Implant Date: 20151113
Implantable Pulse Generator Implant Date: 20151113
MDC IDC LEAD LOCATION: 753860
MDC IDC LEAD SERIAL: 135220
Pulse Gen Serial Number: 104417

## 2017-11-11 NOTE — Telephone Encounter (Signed)
Patient needs a refill on his prescription for Valium.  If you have any questions, please call [phone]

## 2017-11-15 MED ORDER — DIAZEPAM 2 MG PO TABS: 2.0000 mg | ORAL_TABLET | Freq: Four times a day (QID) | ORAL | 0 refills | Status: DC | PRN

## 2017-11-27 ENCOUNTER — Other Ambulatory Visit: Payer: Self-pay

## 2017-11-27 MED ORDER — DIAZEPAM 2 MG PO TABS: 2.0000 mg | ORAL_TABLET | Freq: Four times a day (QID) | ORAL | 0 refills | Status: DC | PRN

## 2017-11-28 ENCOUNTER — Other Ambulatory Visit (HOSPITAL_COMMUNITY): Payer: Self-pay | Admitting: Nurse Practitioner

## 2017-12-02 ENCOUNTER — Ambulatory Visit (INDEPENDENT_AMBULATORY_CARE_PROVIDER_SITE_OTHER): Payer: 59 | Admitting: Adult Health

## 2017-12-02 ENCOUNTER — Encounter: Payer: Self-pay | Admitting: Adult Health

## 2017-12-02 VITALS — BP 115/67 | HR 68 | Ht 67.0 in | Wt 188.4 lb

## 2017-12-02 DIAGNOSIS — I63412 Cerebral infarction due to embolism of left middle cerebral artery: Secondary | ICD-10-CM | POA: Diagnosis not present

## 2017-12-02 DIAGNOSIS — E785 Hyperlipidemia, unspecified: Secondary | ICD-10-CM

## 2017-12-02 DIAGNOSIS — I10 Essential (primary) hypertension: Secondary | ICD-10-CM | POA: Diagnosis not present

## 2017-12-02 NOTE — Patient Instructions (Signed)
Continue Eliquis (apixaban) daily for secondary stroke prevention  Continue to follow up with PCP regarding cholesterol and blood pressure management/monitoring   Continue to follow up with cardiologist as scheduled  Continue to stay active and maintain a healthy diet  Maintain strict control of hypertension with blood pressure goal below 130/90, diabetes with hemoglobin A1c goal below 6.5% and cholesterol with LDL cholesterol (bad cholesterol) goal below 70 mg/dL. I also advised the patient to eat a healthy diet with plenty of whole grains, cereals, fruits and vegetables, exercise regularly and maintain ideal body weight.  Followup in the future with me as needed or call with questions, concerns or need of future follow up appointment       Thank you for coming to see [place] at [facility]. I hope we have been able to provide you high quality care today.  You may receive a patient satisfaction survey over the next few weeks. We would appreciate your feedback and comments so that we may continue to improve ourselves and the health of our patients.

## 2017-12-02 NOTE — Progress Notes (Signed)
I agree with the above plan 

## 2017-12-02 NOTE — Progress Notes (Signed)
[facility] [address] [place]. [place] [address] [phone]       OFFICE FOLLOW UP NOTE  [name] Date of Birth:  1950/06/26 Medical Record Number:  [id number]   Reason for Referral:  hospital stroke follow up  CHIEF COMPLAINT:  Chief Complaint  Patient presents with  . Follow-up    [facility] follow up room 9 pt with     HPI: Glenn [name] is being seen today for follow up visit in the office for left MCA stroke on 09/05/16. History obtained from patient and chart review. Reviewed all radiology images and labs personally.  Glenn Dejournetteis an 67 y.o.malewho presents to the ED as a Code Stroke. He was last seen normal at 7:30 PM, then became suddenly mute with right upper extremity weakness and right facial droop. Deficits were noted by his son, who immediately called 911. At his baseline he is alert, oriented and fully independent.  On arrival to the ED he remained mute with right hemiparesis and prominent right facial droop, in addition to leftward gaze deviation and relative neglect of his right side. CT head reviewed and negative for hemorrhage or acute hypodensity. Hyperdense left M2 segment compatible with acute thrombosis was noted. CTA of head revealed a left MCA superior division origin occlusion with intermediate collateralization. CTA neck revealed patent carotid and vertebral arteries. No dissection, aneurysm, or hemodynamically significant stenosis is identified. CT perfusion study showed no infarct core (CBF less than 30%) by automated RAPID processing. At risk ischemia in the left superior MCA distribution with volume 108 cc was noted.  Patient underwent mechanical thrombectomy for left M2 occlusion and tolerated this well without complication.  After procedure, he had no focal findings on exam with a NIH stroke scale of 0.  Repeat CT scan reviewed and showed no acute abnormalities.  2D echo showed an EF of 30-35%.  Unable to obtain  MRI/MRA due to implanted defibrillator.  LDL 44 and A1c 4.7.  PT/OT/SLP did not recommend therapies as they are not needed.  PTA, patient taking Xarelto due to atrial fibrillation history and aspirin 81 mg for cardiac history daily but as patient was compliant with this medication, it was recommended to switch to Eliquis and continue aspirin 81 mg.  Patient discharged home in stable condition.  Patient was seen in this office on 11/13/16 by Dr. Leonie Man. It was recommended at that time to continue Eliquis and aspirin.   05/31/17 visit: Patient returns today for a 6 month follow up.  Patient has been doing well and continues to take his Eliquis without bleeding or bruising.  He no longer takes aspirin as his cardiologist DC'd this medication.  Continues to take Tikosyn without side effects.  Blood pressure at today's visit 151/72 which patient knows this is elevated but was unable to take all his medications prior to coming to appointment.  He continues to stay active and eat a healthy diet.  No further concerns or new or worsening stroke/TIA symptoms.  Interval history 12/02/2017: Patient is being seen today for scheduled follow-up visit.  Overall he is been doing well from a stroke standpoint without residual deficits or recurring of symptoms.  He continues to take Eliquis without side effects of bleeding or bruising and continues to follow with cardiology for management.  He is not on any statin as during hospital admission lipid panel satisfactory but per epic notes, does not appear his lipid panel has been repeated since 08/2016.  Blood pressure today  satisfactory 115/67.  Patient unfortunately sustained a left wrist fracture in 07/2017 and was placed in a cast.  He was seen at [facility] on 09/23/2017 for left wrist pain.  Per notes, the day prior to cast got wet from the rain and he reviewed this with a razor blade.  X-ray showed healing fracture of the distal radius and it was recommended for him to follow-up with hand  specialist.  He does continue to endorse mild pain and decreased range of motion but overall has been improving.  No further concerns at this time.  Denies new or worsening stroke/TIA symptoms.   ROS:   14 system review of systems performed and negative with exception of no complaints  PMH:  Past Medical History:  Diagnosis Date  . Atrial fibrillation ([place])   . CHF (congestive heart failure) ([place])   . Hepatitis C   . Hypertension   . Stroke ([place])   . Visit for monitoring Tikosyn therapy 03/26/2017    PSH:  Past Surgical History:  Procedure Laterality Date  . CARDIAC DEFIBRILLATOR PLACEMENT  2015  . CARDIOVERSION N/A 10/10/2016   Procedure: CARDIOVERSION;  Surgeon: Dorothy Spark, MD;  Location: [facility] ENDOSCOPY;  Service: Cardiovascular;  Laterality: N/A;  . CARDIOVERSION N/A 03/27/2017   Procedure: CARDIOVERSION;  Surgeon: Jerline Pain, MD;  Location: [place];  Service: Cardiovascular;  Laterality: N/A;  . EYE SURGERY Left 1990  . IR PERCUTANEOUS ART THROMBECTOMY/INFUSION INTRACRANIAL INC DIAG ANGIO  09/05/2016  . IR RADIOLOGIST EVAL & MGMT  10/03/2016  . RADIOLOGY WITH ANESTHESIA N/A 09/05/2016   Procedure: RADIOLOGY WITH ANESTHESIA;  Surgeon: Luanne Bras, MD;  Location: [place];  Service: Radiology;  Laterality: N/A;    Social History:  Social History   Socioeconomic History  . Marital status: Single    Spouse name: Not on file  . Number of children: Not on file  . Years of education: 46 (some college)  . Highest education level: Not on file  Occupational History  . Occupation: disability  Social Needs  . Financial resource strain: Not on file  . Food insecurity:    Worry: Not on file    Inability: Not on file  . Transportation needs:    Medical: Not on file    Non-medical: Not on file  Tobacco Use  . Smoking status: Current Some Day Smoker    Packs/day: 0.50    Types: Cigarettes  . Smokeless tobacco: Never Used  . Tobacco comment: a pack last three  days  Substance and Sexual Activity  . Alcohol use: Not Currently    Alcohol/week: 3.0 standard drinks    Types: 3 Cans of beer per week    Comment: pt stop drinking   . Drug use: Yes    Frequency: 2.0 times per week    Types: Marijuana    Comment: stop smoking   . Sexual activity: Yes    Partners: Female    Birth control/protection: Condom  Lifestyle  . Physical activity:    Days per week: Not on file    Minutes per session: Not on file  . Stress: Not on file  Relationships  . Social connections:    Talks on phone: Not on file    Gets together: Not on file    Attends religious service: Not on file    Active member of club or organization: Not on file    Attends meetings of clubs or organizations: Not on file    Relationship status: Not  on file  . Intimate partner violence:    Fear of current or ex partner: Not on file    Emotionally abused: Not on file    Physically abused: Not on file    Forced sexual activity: Not on file  Other Topics Concern  . Not on file  Social History Narrative  . Not on file    Family History:  Family History  Problem Relation Age of Onset  . Stroke Maternal Aunt   . Heart disease Neg Hx     Medications:   Current Outpatient Medications on File Prior to Visit  Medication Sig Dispense Refill  . apixaban (ELIQUIS) 5 MG TABS tablet Take 1 tablet (5 mg total) by mouth 2 (two) times daily. 60 tablet 9  . busPIRone (BUSPAR) 7.5 MG tablet Take 7.5 mg by mouth daily as needed for depression.    . carvedilol (COREG) 25 MG tablet Take 1 tablet (25 mg total) by mouth 2 (two) times daily with a meal. 60 tablet 6  . Cyanocobalamin (B-12) 500 MCG TABS Take 500 mcg by mouth daily as needed (energy). 30 tablet 2  . diazepam (VALIUM) 2 MG tablet Take 1 tablet (2 mg total) by mouth every 6 (six) hours as needed for anxiety. 30 tablet 0  . diltiazem (CARDIZEM CD) 120 MG 24 hr capsule TAKE ONE CAPSULE BY MOUTH AT BEDTIME 90 capsule 0  . dofetilide (TIKOSYN)  500 MCG capsule TAKE 1 CAPSULE BY MOUTH TWICE A DAY 180 capsule 2  . furosemide (LASIX) 40 MG tablet TAKE 1 TABLET(40 MG) BY MOUTH DAILY 30 tablet 0  . Glecaprevir-Pibrentasvir (MAVYRET) 100-40 MG TABS Take 3 tablets by mouth daily with supper. 84 tablet 1  . hydrALAZINE (APRESOLINE) 25 MG tablet Take 1 tablet (25 mg total) by mouth 2 (two) times daily. 60 tablet 11  . magnesium oxide (MAG-OX) 400 MG tablet Take 400 mg by mouth daily.    . potassium chloride SA (K-DUR,KLOR-CON) 20 MEQ tablet Take 1 tablet (20 mEq total) by mouth daily. 90 tablet 3   No current facility-administered medications on file prior to visit.     Allergies:   Allergies  Allergen Reactions  . Benadryl [Diphenhydramine] Palpitations     Physical Exam  Vitals:   12/02/17 0950  BP: 115/67  Pulse: 68  Weight: 188 lb 6.4 oz (85.5 kg)  Height: 5\' 7"  (1.702 m)   Body mass index is 29.51 kg/m. No exam data present  General: well developed, pleasant African-American middle-aged male, well nourished, seated, in no evident distress Head: head normocephalic and atraumatic.   Neck: supple with no carotid or supraclavicular bruits Cardiovascular: regular rate and rhythm, no murmurs Musculoskeletal: no deformity; decreased ROM left wrist Skin:  no rash/petichiae Vascular:  Normal pulses all extremities  Neurologic Exam Mental Status: Awake and fully alert. Oriented to place and time. Recent and remote memory intact. Attention span, concentration and fund of knowledge appropriate. Mood and affect appropriate.  Cranial Nerves: Fundoscopic exam deferred. Pupils equal, briskly reactive to light. Extraocular movements full without nystagmus. Visual fields full to confrontation. Hearing intact. Facial sensation intact. Face, tongue, palate moves normally and symmetrically.  Motor: Normal bulk and tone. Normal strength in all tested extremity muscles. Sensory.: intact to touch , pinprick , position and vibratory sensation.   Coordination: Rapid alternating movements normal in all extremities. Finger-to-nose and heel-to-shin performed accurately bilaterally. Gait and Station: Arises from chair without difficulty. Stance is normal. Gait demonstrates normal stride length and  balance . Able to heel, toe and tandem walk without difficulty.  Reflexes: 1+ and symmetric. Toes downgoing.      Diagnostic Data (Labs, Imaging, Testing)   Ct Angio Head W Or Wo Contrast Ct Angio Neck W And/or Wo Contrast 09/05/2016 CTA head:  1. Left MCA superior division origin occlusion with intermediate collateralization.  2. Otherwise patent circle of Willis without significant stenosis or aneurysm identified.   CTA neck:  1. Patent carotid and vertebral arteries. No dissection, aneurysm, or hemodynamically significant stenosis is identified.  2. 5 mm right upper lobe pulmonary nodule.  Ct Head Wo Contrast 09/06/2016 No hemorrhage or other acute abnormality following neurovascular intervention. Gray-white differentiation is preserved in the region of ischemia demonstrated on the earlier perfusion study.  Ct Cerebral Perfusion W Contrast 09/05/2016 CT perfusion: No infarct core (CBF less than 30%) by automated RAPID processing. At risk ischemia in the left superior MCA distribution with volume 108 cc. CTA head:    Ct Head Code Stroke W/o Cm 09/05/2016  1. Negative for acute infarct or hemorrhage. Hyperdense left M2 segment compatible with acute thrombosis.  2. ASPECTS is 10   DSA - complete revascularization of occluded Lt MCA superior division with x 1 pass with solitaire 33mm x 40 mm retriever device achieving a TICI 3 reperfusion.  CUS- Findings are consistent with a 1-39 percent stenosis involving the right internal carotid artery and the left internal carotid artery. The vertebral arteries demonstrate antegrade flow.  TTE- Left ventricle: The cavity size was normal. Systolic function was moderately to  severely reduced. The estimated ejection fraction was in the range of 30% to 35%.Diffuse hypokinesis. Moderate hypokinesis of the anteroseptal, inferior, and inferoseptal myocardium. - Aortic valve: Transvalvular velocity was within the normal range. There was no stenosis. There was moderate regurgitation. Valve area (VTI): 2.25 cm^2. Valve area (Vmax): 2.37 cm^2. Valve area (Vmean): 2.34 cm^2. - Mitral valve: Transvalvular velocity was within the normal range. There was no evidence for stenosis. There was mild regurgitation. - Left atrium: The atrium was moderately dilated. - Right ventricle: The cavity size was normal. Wall thickness was normal. Systolic function was mildly reduced. - Right atrium: The atrium was moderately dilated. - Tricuspid valve: There was mild-moderate regurgitation. - Pulmonary arteries: Systolic pressure was mildly increased. PA peak pressure: 38 mm Hg (S).    ASSESSMENT: Glenn [name] is a 67 y.o. year old male here with left MCA stroke on 09/05/16 secondary to atrial fibrillation and low EF although on Xarelto and ASA. Patient underwent mechanical thrombectomy with excellent clinical recovery Vascular risk factors include atrial fibrillation with implanted defibrillator, CHF, and HTN.  Patient returns today for follow-up visit and overall stable from stroke standpoint without residual deficits or recurring of symptoms.   PLAN: -Continue Eliquis (apixaban) daily for secondary stroke prevention and atrial fibrillation -f/u with cardiologist for atrial fibrillation and Eliquis management along with other chronic cardiac conditions -Advised to follow-up with PCP with possible need of orthopedic services for continued left wrist pain if needed -F/u with PCP regarding your HTN management -request lipid panel be obtained at follow-up appointment to ensure continued satisfactory cholesterol levels -continue to eat healthy and remain  active -Maintain strict control of hypertension with blood pressure goal below 130/90, diabetes with hemoglobin A1c goal below 6.5% and cholesterol with LDL cholesterol (bad cholesterol) goal below 70 mg/dL. I also advised the patient to eat a healthy diet with plenty of whole grains, cereals, fruits and vegetables, exercise regularly and  maintain ideal body weight.  Follow up as needed as stable from stroke standpoint and advised to call with questions, concerns or need of future follow-up appointment.   Greater than 50% time during this 25 minute consultation visit was spent on counseling and coordination of care about a fib, and HTN, discussion about risk benefit of anticoagulation and answering questions.    Venancio Poisson, AGNP-BC  [facility] [address] [place], [place] [address]  Phone [phone] Fax [phone]

## 2017-12-03 ENCOUNTER — Other Ambulatory Visit: Payer: Self-pay | Admitting: Behavioral Health

## 2017-12-03 ENCOUNTER — Other Ambulatory Visit: Payer: Medicare HMO

## 2017-12-03 DIAGNOSIS — B182 Chronic viral hepatitis C: Secondary | ICD-10-CM

## 2017-12-05 LAB — COMPREHENSIVE METABOLIC PANEL
AG RATIO: 1.5 (calc) (ref 1.0–2.5)
ALBUMIN MSPROF: 4.4 g/dL (ref 3.6–5.1)
ALKALINE PHOSPHATASE (APISO): 75 U/L (ref 40–115)
ALT: 19 U/L (ref 9–46)
AST: 22 U/L (ref 10–35)
BILIRUBIN TOTAL: 0.4 mg/dL (ref 0.2–1.2)
BUN/Creatinine Ratio: 6 (calc) (ref 6–22)
BUN: 8 mg/dL (ref 7–25)
CALCIUM: 9.4 mg/dL (ref 8.6–10.3)
CHLORIDE: 104 mmol/L (ref 98–110)
CO2: 32 mmol/L (ref 20–32)
Creat: 1.4 mg/dL — ABNORMAL HIGH (ref 0.70–1.25)
GLOBULIN: 3 g/dL (ref 1.9–3.7)
Glucose, Bld: 66 mg/dL (ref 65–99)
POTASSIUM: 3.9 mmol/L (ref 3.5–5.3)
SODIUM: 141 mmol/L (ref 135–146)
TOTAL PROTEIN: 7.4 g/dL (ref 6.1–8.1)

## 2017-12-05 LAB — HEPATITIS C RNA QUANTITATIVE
HCV Quantitative Log: <1.18 Log IU/mL
HCV RNA, PCR, QN: NOT DETECTED [IU]/mL

## 2017-12-10 ENCOUNTER — Ambulatory Visit: Payer: Medicare HMO | Admitting: Infectious Diseases

## 2017-12-23 ENCOUNTER — Other Ambulatory Visit (HOSPITAL_COMMUNITY): Payer: Self-pay | Admitting: Nurse Practitioner

## 2017-12-29 ENCOUNTER — Other Ambulatory Visit (HOSPITAL_COMMUNITY): Payer: Self-pay | Admitting: Nurse Practitioner

## 2017-12-30 ENCOUNTER — Other Ambulatory Visit: Payer: Self-pay

## 2017-12-30 MED ORDER — DIAZEPAM 2 MG PO TABS: 2.0000 mg | ORAL_TABLET | Freq: Four times a day (QID) | ORAL | 0 refills | Status: DC | PRN

## 2017-12-31 ENCOUNTER — Telehealth: Payer: Self-pay

## 2017-12-31 NOTE — Telephone Encounter (Signed)
Pharmacist at [facility] called. Diazepam 2 mg is on national back order. Will PCP change to 5 mg with instructions to talk 0.5 tab q 6 hours PRN?  Call back is [phone]  Danley Danker, RN [facility] [place])

## 2018-01-01 MED ORDER — DIAZEPAM 5 MG PO TABS: ORAL_TABLET | ORAL | 0 refills | Status: DC

## 2018-01-01 NOTE — Telephone Encounter (Signed)
Patient called to check on this.  Danley Danker, RN [address] [facility] RN)

## 2018-01-01 NOTE — Telephone Encounter (Signed)
Changed valium to 1/2 tablet of 5mg  due to national drug shortage. Please let the patient know this has been sent in for pickup and please re-iterate that he is only supposed to take half of the tab.  [name] MD PGY-2 Family Medicine Resident

## 2018-01-03 NOTE — Telephone Encounter (Signed)
Called and informed patient of change in Valium dosage of 1/2 tab.  Patient expressed acknowledgement.  Glenn [name], [place]

## 2018-01-06 ENCOUNTER — Ambulatory Visit (INDEPENDENT_AMBULATORY_CARE_PROVIDER_SITE_OTHER): Payer: Medicare HMO

## 2018-01-06 DIAGNOSIS — I255 Ischemic cardiomyopathy: Secondary | ICD-10-CM | POA: Diagnosis not present

## 2018-01-06 NOTE — Progress Notes (Signed)
Remote ICD transmission.   

## 2018-01-08 LAB — CUP PACEART INCLINIC DEVICE CHECK
Date Time Interrogation Session: 20191120161831
Implantable Lead Implant Date: 20151113
Implantable Lead Model: 295
Implantable Lead Serial Number: 135220
Implantable Pulse Generator Implant Date: 20151113
MDC IDC LEAD LOCATION: 753860
MDC IDC PG SERIAL: 104417

## 2018-01-09 ENCOUNTER — Encounter: Payer: Self-pay | Admitting: Cardiology

## 2018-01-31 ENCOUNTER — Encounter: Payer: Self-pay | Admitting: Family Medicine

## 2018-02-13 ENCOUNTER — Other Ambulatory Visit: Payer: Self-pay | Admitting: Family Medicine

## 2018-02-17 ENCOUNTER — Other Ambulatory Visit (HOSPITAL_COMMUNITY): Payer: Self-pay | Admitting: Nurse Practitioner

## 2018-02-17 ENCOUNTER — Other Ambulatory Visit: Payer: Self-pay | Admitting: Physician Assistant

## 2018-02-17 ENCOUNTER — Other Ambulatory Visit: Payer: Self-pay | Admitting: Internal Medicine

## 2018-02-28 ENCOUNTER — Encounter: Payer: Self-pay | Admitting: Family Medicine

## 2018-02-28 ENCOUNTER — Ambulatory Visit (INDEPENDENT_AMBULATORY_CARE_PROVIDER_SITE_OTHER): Payer: Medicare Other | Admitting: Family Medicine

## 2018-02-28 VITALS — BP 130/79 | HR 52 | Temp 97.9°F | Wt 202.6 lb

## 2018-02-28 DIAGNOSIS — Z23 Encounter for immunization: Secondary | ICD-10-CM | POA: Diagnosis not present

## 2018-02-28 DIAGNOSIS — G5601 Carpal tunnel syndrome, right upper limb: Secondary | ICD-10-CM

## 2018-02-28 DIAGNOSIS — G5621 Lesion of ulnar nerve, right upper limb: Secondary | ICD-10-CM

## 2018-02-28 HISTORY — DX: Carpal tunnel syndrome, right upper limb: G56.01

## 2018-02-28 HISTORY — DX: Lesion of ulnar nerve, right upper limb: G56.21

## 2018-02-28 MED ORDER — GABAPENTIN 300 MG PO CAPS: 300.0000 mg | ORAL_CAPSULE | Freq: Three times a day (TID) | ORAL | 0 refills | Status: DC

## 2018-02-28 MED ORDER — ACE ELBOW BRACE SMALL/MEDIUM MISC: 1.0000 | Freq: Once | 0 refills | Status: AC

## 2018-02-28 MED ORDER — ACE WRIST BRACE/SPLINT MISC: 1.0000 | Freq: Once | 0 refills | Status: AC

## 2018-02-28 NOTE — Patient Instructions (Signed)
It was great seeing you again today!  You have accommodation of ulnar nerve entrapment and carpal tunnel syndrome.  I have you a prescription for braces for both of these.  He can take these in the medical supply store to get these.  Because you are on Eliquis we cannot do a nonsteroidal so we will have to try a medication called gabapentin which is good for nerve pain.  I have also given you some stretches to do.  You try topical items such as icy hot, Biofreeze, capsaicin ointment, salon pause for additional relief.  If these not work that we will have to pursue more aggressive measures such as injections or even surgery down the road.

## 2018-02-28 NOTE — Assessment & Plan Note (Signed)
Patient with mixed picture of both median nerve and ulnar nerve entrapment.  Will treat as combination.  Patient with contraindication to NSAIDs secondary to his Eliquis use for A. fib.  Will treat with gabapentin milligrams 3 times daily.  Give him medium sized elbow brace.  Gave him ulnar nerve glide exercises to do.  If conservative measures do not work will likely refer patient to sports medicine for further management.

## 2018-02-28 NOTE — Progress Notes (Signed)
   HPI 68 year old male who presents for a burning sensation in his right hand.  He states that it first started a few weeks ago.  Per his report he was in his normal state of health he woke up with [place] like that.  The pain comes and goes but it is usually worse at night.  He has tried some ibuprofen which has provided little relief.  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  CC: Right hand burning   ROS:   Review of Systems See HPI for ROS.   CC, SH/smoking status, and VS noted  Objective: BP 130/79   Pulse (!) 52   Temp 97.9 F (36.6 C) (Oral)   Wt 91.9 kg   SpO2 98%   BMI 31.73 kg/m  Gen: Pleasant 68 year old African-American male resting comfortably CV: RRR, no murmur Resp: CTAB, no wheezes, non-labored Neuro: Alert and oriented, Speech clear, No gross deficits Right hand: No asymmetry noted from left hand.  Patient with palpable radial pulse.  Range of motion intact.  5 strength in grip, finger spread, thumb to forefinger.  Positive Tinel sign, positive Phalen sign on right.  Patient endorses burning, numbness and tingling in fourth and fifth digit during his test along with burning, numbness, tingling with Tinel's test.  Patient does have pain to tapping his ulnar nerve canal.   Assessment and plan:  Carpal tunnel syndrome of right wrist We will treat for combination carpal tunnel and ulnar nerve entrapment.  Patient is on Eliquis due to A. fib.  Contraindication to NSAIDs.  We will try gabapentin 300 mg 3 times daily.  Gave him short forearm brace.  Also gave rehab exercises to do.  Patient to follow-up as needed if pain does not resolve.  If conservative measures do not help will likely refer to sports medicine for further management.  Entrapment of right ulnar nerve Patient with mixed picture of both median nerve and ulnar nerve entrapment.  Will treat as combination.   Patient with contraindication to NSAIDs secondary to his Eliquis use for A. fib.  Will treat with gabapentin milligrams 3 times daily.  Give him medium sized elbow brace.  Gave him ulnar nerve glide exercises to do.  If conservative measures do not work will likely refer patient to sports medicine for further management.   Orders Placed This Encounter  Procedures  . Flu vaccine HIGH DOSE PF    Meds ordered this encounter  Medications  . gabapentin (NEURONTIN) 300 MG capsule    Sig: Take 1 capsule (300 mg total) by mouth 3 (three) times daily.    Dispense:  90 capsule    Refill:  0  . Elastic Bandages & Supports (ACE WRIST BRACE/SPLINT) MISC    Sig: 1 each by Does not apply route once for 1 dose.    Dispense:  1 each    Refill:  0    Wrist splint for carpal tunnel  . Elastic Bandages & Supports (ACE ELBOW BRACE SMALL/MEDIUM) MISC    Sig: 1 each by Does not apply route once for 1 dose.    Dispense:  1 each    Refill:  0    Medium sized elbow brace for ulnar nerve entrapment     [name] MD PGY-2 Family Medicine Resident  02/28/2018 6:37 PM

## 2018-02-28 NOTE — Assessment & Plan Note (Signed)
We will treat for combination carpal tunnel and ulnar nerve entrapment.  Patient is on Eliquis due to A. fib.  Contraindication to NSAIDs.  We will try gabapentin 300 mg 3 times daily.  Gave him short forearm brace.  Also gave rehab exercises to do.  Patient to follow-up as needed if pain does not resolve.  If conservative measures do not help will likely refer to sports medicine for further management.

## 2018-03-04 LAB — CUP PACEART REMOTE DEVICE CHECK
Battery Remaining Longevity: 36 mo
Battery Remaining Percentage: 75 %
Brady Statistic RV Percent Paced: 2 %
HighPow Impedance: 44 Ohm
Implantable Lead Implant Date: 20151113
Implantable Lead Location: 753860
Implantable Lead Serial Number: 135220
Implantable Pulse Generator Implant Date: 20151113
Lead Channel Impedance Value: 838 Ohm
Lead Channel Setting Pacing Amplitude: 2.5 V
Lead Channel Setting Pacing Pulse Width: 0.5 ms
Lead Channel Setting Sensing Sensitivity: 0.5 mV
MDC IDC MSMT LEADCHNL RV PACING THRESHOLD AMPLITUDE: 0.9 V
MDC IDC MSMT LEADCHNL RV PACING THRESHOLD PULSEWIDTH: 0.5 ms
MDC IDC SESS DTM: 20191118051100
Pulse Gen Serial Number: 104417

## 2018-03-18 ENCOUNTER — Other Ambulatory Visit: Payer: Self-pay | Admitting: Family Medicine

## 2018-04-07 ENCOUNTER — Ambulatory Visit (INDEPENDENT_AMBULATORY_CARE_PROVIDER_SITE_OTHER): Payer: Medicare Other

## 2018-04-07 DIAGNOSIS — I5022 Chronic systolic (congestive) heart failure: Secondary | ICD-10-CM

## 2018-04-07 DIAGNOSIS — I255 Ischemic cardiomyopathy: Secondary | ICD-10-CM

## 2018-04-07 LAB — CUP PACEART REMOTE DEVICE CHECK
Battery Remaining Longevity: 36 mo
Battery Remaining Percentage: 72 %
Brady Statistic RV Percent Paced: 2 %
Date Time Interrogation Session: 20200217051000
HighPow Impedance: 44 Ohm
Implantable Lead Implant Date: 20151113
Implantable Lead Location: 753860
Implantable Lead Model: 295
Implantable Lead Serial Number: 135220
Implantable Pulse Generator Implant Date: 20151113
Lead Channel Impedance Value: 834 Ohm
Lead Channel Pacing Threshold Amplitude: 0.9 V
Lead Channel Pacing Threshold Pulse Width: 0.5 ms
Lead Channel Setting Pacing Amplitude: 2.5 V
Lead Channel Setting Sensing Sensitivity: 0.5 mV
MDC IDC SET LEADCHNL RV PACING PULSEWIDTH: 0.5 ms
Pulse Gen Serial Number: 104417

## 2018-04-16 NOTE — Progress Notes (Signed)
Remote ICD transmission.   

## 2018-04-21 ENCOUNTER — Other Ambulatory Visit: Payer: Self-pay | Admitting: Family Medicine

## 2018-04-21 MED ORDER — DIAZEPAM 5 MG PO TABS: ORAL_TABLET | ORAL | 0 refills | Status: DC

## 2018-05-16 ENCOUNTER — Other Ambulatory Visit: Payer: Self-pay | Admitting: Family Medicine

## 2018-05-17 ENCOUNTER — Other Ambulatory Visit: Payer: Self-pay | Admitting: Family Medicine

## 2018-06-19 ENCOUNTER — Other Ambulatory Visit: Payer: Self-pay | Admitting: Family Medicine

## 2018-06-22 ENCOUNTER — Other Ambulatory Visit: Payer: Self-pay | Admitting: Internal Medicine

## 2018-06-23 NOTE — Telephone Encounter (Signed)
Pt is a 68 yr old male who saw Dr. [name] on 10/15/17. Last noted weight was 91.9Kg on 02/28/18. SCr on 09/20/17 was 1.22, thus will refill Eliquis 5mg  BID based on specified criteria.

## 2018-06-24 ENCOUNTER — Other Ambulatory Visit: Payer: Self-pay | Admitting: Family Medicine

## 2018-07-07 ENCOUNTER — Ambulatory Visit (INDEPENDENT_AMBULATORY_CARE_PROVIDER_SITE_OTHER): Payer: Medicare HMO | Admitting: *Deleted

## 2018-07-07 ENCOUNTER — Other Ambulatory Visit: Payer: Self-pay

## 2018-07-07 DIAGNOSIS — I255 Ischemic cardiomyopathy: Secondary | ICD-10-CM | POA: Diagnosis not present

## 2018-07-08 ENCOUNTER — Telehealth: Payer: Self-pay

## 2018-07-08 NOTE — Telephone Encounter (Signed)
Left message for patient to remind of missed remote transmission.  

## 2018-07-09 LAB — CUP PACEART REMOTE DEVICE CHECK
Battery Remaining Longevity: 30 mo
Battery Remaining Percentage: 60 %
Brady Statistic RV Percent Paced: 1 %
Date Time Interrogation Session: 20200518041100
HighPow Impedance: 41 Ohm
Implantable Lead Implant Date: 20151113
Implantable Lead Location: 753860
Implantable Lead Model: 295
Implantable Lead Serial Number: 135220
Implantable Pulse Generator Implant Date: 20151113
Lead Channel Impedance Value: 840 Ohm
Lead Channel Pacing Threshold Amplitude: 0.9 V
Lead Channel Pacing Threshold Pulse Width: 0.5 ms
Lead Channel Setting Pacing Amplitude: 2.5 V
Lead Channel Setting Pacing Pulse Width: 0.5 ms
Lead Channel Setting Sensing Sensitivity: 0.5 mV
Pulse Gen Serial Number: 104417

## 2018-07-15 ENCOUNTER — Encounter: Payer: Self-pay | Admitting: Cardiology

## 2018-07-15 NOTE — Progress Notes (Signed)
Remote ICD transmission.   

## 2018-07-16 ENCOUNTER — Other Ambulatory Visit (HOSPITAL_COMMUNITY): Payer: Self-pay | Admitting: Nurse Practitioner

## 2018-07-17 ENCOUNTER — Other Ambulatory Visit (HOSPITAL_COMMUNITY): Payer: Self-pay | Admitting: Nurse Practitioner

## 2018-07-17 NOTE — Telephone Encounter (Signed)
This is a A-Fib clinic pt 

## 2018-07-22 ENCOUNTER — Other Ambulatory Visit: Payer: Self-pay | Admitting: Family Medicine

## 2018-08-21 ENCOUNTER — Other Ambulatory Visit: Payer: Self-pay | Admitting: Family Medicine

## 2018-08-21 DIAGNOSIS — H11133 Conjunctival pigmentations, bilateral: Secondary | ICD-10-CM | POA: Diagnosis not present

## 2018-08-21 DIAGNOSIS — H00029 Hordeolum internum unspecified eye, unspecified eyelid: Secondary | ICD-10-CM | POA: Diagnosis not present

## 2018-08-23 ENCOUNTER — Other Ambulatory Visit: Payer: Self-pay | Admitting: Family Medicine

## 2018-08-25 DIAGNOSIS — H11133 Conjunctival pigmentations, bilateral: Secondary | ICD-10-CM | POA: Diagnosis not present

## 2018-08-25 DIAGNOSIS — H00029 Hordeolum internum unspecified eye, unspecified eyelid: Secondary | ICD-10-CM | POA: Diagnosis not present

## 2018-09-02 DIAGNOSIS — Z20828 Contact with and (suspected) exposure to other viral communicable diseases: Secondary | ICD-10-CM | POA: Diagnosis not present

## 2018-09-04 ENCOUNTER — Encounter: Payer: Self-pay | Admitting: Family Medicine

## 2018-09-15 DIAGNOSIS — H11153 Pinguecula, bilateral: Secondary | ICD-10-CM | POA: Diagnosis not present

## 2018-09-15 DIAGNOSIS — H2513 Age-related nuclear cataract, bilateral: Secondary | ICD-10-CM | POA: Diagnosis not present

## 2018-09-15 DIAGNOSIS — H0011 Chalazion right upper eyelid: Secondary | ICD-10-CM | POA: Diagnosis not present

## 2018-09-19 ENCOUNTER — Other Ambulatory Visit (HOSPITAL_COMMUNITY): Payer: Self-pay | Admitting: Nurse Practitioner

## 2018-09-19 NOTE — Telephone Encounter (Signed)
This is a A-Fib clinic pt 

## 2018-09-22 ENCOUNTER — Other Ambulatory Visit: Payer: Self-pay | Admitting: Family Medicine

## 2018-09-25 ENCOUNTER — Other Ambulatory Visit (HOSPITAL_COMMUNITY): Payer: Self-pay | Admitting: Family Medicine

## 2018-09-25 ENCOUNTER — Other Ambulatory Visit: Payer: Self-pay | Admitting: Physician Assistant

## 2018-10-06 ENCOUNTER — Other Ambulatory Visit (HOSPITAL_COMMUNITY): Payer: Self-pay | Admitting: Nurse Practitioner

## 2018-10-07 ENCOUNTER — Other Ambulatory Visit (HOSPITAL_COMMUNITY): Payer: Self-pay | Admitting: Nurse Practitioner

## 2018-10-07 ENCOUNTER — Encounter: Payer: Medicaid Other | Admitting: *Deleted

## 2018-10-08 ENCOUNTER — Telehealth: Payer: Self-pay

## 2018-10-08 NOTE — Telephone Encounter (Signed)
Left message for patient to remind of missed remote transmission.  

## 2018-10-10 ENCOUNTER — Other Ambulatory Visit: Payer: Self-pay | Admitting: Family Medicine

## 2018-10-13 ENCOUNTER — Other Ambulatory Visit: Payer: Self-pay

## 2018-10-13 ENCOUNTER — Ambulatory Visit (HOSPITAL_COMMUNITY)
Admission: RE | Admit: 2018-10-13 | Discharge: 2018-10-13 | Disposition: A | Discharge status: Home / Self Care | Payer: Medicare HMO | Source: Ambulatory Visit | Attending: Nurse Practitioner | Admitting: Nurse Practitioner

## 2018-10-13 ENCOUNTER — Encounter (HOSPITAL_COMMUNITY): Payer: Self-pay | Admitting: Nurse Practitioner

## 2018-10-13 VITALS — BP 146/76 | HR 112 | Ht 67.0 in | Wt 205.6 lb

## 2018-10-13 DIAGNOSIS — F17210 Nicotine dependence, cigarettes, uncomplicated: Secondary | ICD-10-CM | POA: Diagnosis not present

## 2018-10-13 DIAGNOSIS — Z79899 Other long term (current) drug therapy: Secondary | ICD-10-CM | POA: Insufficient documentation

## 2018-10-13 DIAGNOSIS — Z8673 Personal history of transient ischemic attack (TIA), and cerebral infarction without residual deficits: Secondary | ICD-10-CM | POA: Insufficient documentation

## 2018-10-13 DIAGNOSIS — Z823 Family history of stroke: Secondary | ICD-10-CM | POA: Diagnosis not present

## 2018-10-13 DIAGNOSIS — Z888 Allergy status to other drugs, medicaments and biological substances status: Secondary | ICD-10-CM | POA: Diagnosis not present

## 2018-10-13 DIAGNOSIS — I509 Heart failure, unspecified: Secondary | ICD-10-CM | POA: Insufficient documentation

## 2018-10-13 DIAGNOSIS — I083 Combined rheumatic disorders of mitral, aortic and tricuspid valves: Secondary | ICD-10-CM | POA: Insufficient documentation

## 2018-10-13 DIAGNOSIS — I480 Paroxysmal atrial fibrillation: Secondary | ICD-10-CM

## 2018-10-13 DIAGNOSIS — I4819 Other persistent atrial fibrillation: Secondary | ICD-10-CM | POA: Insufficient documentation

## 2018-10-13 DIAGNOSIS — Z7901 Long term (current) use of anticoagulants: Secondary | ICD-10-CM | POA: Insufficient documentation

## 2018-10-13 DIAGNOSIS — I110 Hypertensive heart disease with heart failure: Secondary | ICD-10-CM | POA: Insufficient documentation

## 2018-10-13 LAB — BASIC METABOLIC PANEL
Anion gap: 7 (ref 5–15)
BUN: 12 mg/dL (ref 8–23)
CO2: 23 mmol/L (ref 22–32)
Calcium: 9.2 mg/dL (ref 8.9–10.3)
Chloride: 110 mmol/L (ref 98–111)
Creatinine, Ser: 1.18 mg/dL (ref 0.61–1.24)
GFR calc Af Amer: >60 mL/min (ref >60)
GFR calc non Af Amer: >60 mL/min (ref >60)
Glucose, Bld: 117 mg/dL — ABNORMAL HIGH (ref 70–99)
Potassium: 4.1 mmol/L (ref 3.5–5.1)
Sodium: 140 mmol/L (ref 135–145)

## 2018-10-13 LAB — MAGNESIUM: Magnesium: 1.8 mg/dL (ref 1.7–2.4)

## 2018-10-13 NOTE — Progress Notes (Signed)
Primary Care Physician: [name], MD Referring Physician: Dr. [name] is a 68 y.o. male with a h/o afib on Xarelto with ICD, CHF, hepatitis C and hypertension who presented to [facility] 09/05/2016, with left MCA CVA.He did not receive TPA due to arriving outside the treatment window and being on Xarelto. He underwent chemical thrombectomy for left M2 occlusion. Patient was switched to Eliquis for secondary stroke prevention and aspirin 81 mg was added given his cardiac history. 2-D echo in the hospital LVEF 30-35%.   Patient moved here from [place] in May. He has a 2 year history of Atrial fibrillation and had an ICD-[employer] 12/2015  implanted.Says he had a heart cath that showed no blockages but he has h/o CHF. Smokes 1 ppd 30-40 yrs. Says he had chest tightness and shortness of breath for about a week prior to CVA. Also missed a couple of Xarelto doses.   Patient saw Dr. [name] as a new patient 09/19/16. Device interrogation demonstrated rapid atrial rates with averages over 100 bpm. He also had acute on chronic CHF. Dr. [name] felt the atrial fibrillation was likely contributing to his heart failure. Low-dose diltiazem was added despite the fact that he had CHF,based on the presumption that his atrial fibrillation rate was contributing to his heart failure. He also wanted to attempt cardioversion . His LA size has discordant measurements. He felt if he could maintain sinus rhythm and potentially dofetilide or amiodarone might be beneficial. Lasix was increased to 40 mg daily.   He underwent successful cardioversion 10/10/16.  In the ED 02/14/17 with left chest and left shoulder pain that was reproducible to touch.  He was discharged on tramadol.   Patient was seen by [name] 03/19/17.  He had stopped his lisinopril for 2 months because it was making him feel dizzy but he started it back 3 days ago taking it at night.  He says this has helped.  Blood pressure  was quite low when he arrived at 78/46 but on recheck it was 98/46.  He also says his heart is skipping and racing especially at night when he lays down.  His breathing is stable.  Complaining of chronic aching pain in his shoulder and neck.   He said his tramadol made him feel like a zombie and he cannot tolerate it. Dr. [name] consulted re pt and  did discuss possible dofetilide or amiodarone.  Pt  in the afib clinic, 03/25/17, for discussion of admission for Tikosyn. Pt has medicare/medicaid and cost should not be a problem. Specifics re Tikosyn discussed with pt, especially that he has to take specifically as written and can not miss doses, f/u is very important as scheduled with labs/ekg's. He would like to proceed. PharmD has reviewed drugs and no contraindicated or QTc prolonging medications. Denies any benadryl use, no missed doses of eliquis.    F/u hospitalization [facility] 2/4 thru 2/7 for tikosyn loading. He had successful cardioversion and left the hospital in [place] with stable qtc. He is taking Tikosyn correctly and feels improved. He has noted some issues with erectile dysfunction.  F/u in afib clinic, 07/25/17 pt is in afib. He felt some irregular HB yesterday. Cardizem was stopped in March with Dr. [name] 2/2 fatigue, with  HR in the low 50's. Pt is planning to leave for [place] for 2-3 weeks this weekend. Will place back on cardizem as he has RVR today.   Return to [place] 8/1. Ekg shows  a fib with occasional v paced complexes. He feels pretty good.  [name] with BS interrogated device, he does not have an atrial lead which would reveal more info  but by other markers, [name] feels he has been persistent for a while. Pt states that he is compliant with Tikosyn and eliquis. Will schedule  for cardioversion. HE continues to smoke. Drinks 2 beers 2x a week.  F/u in afib clinic, 10/13/18 for refills as pt has not been seen in cardiology for a year. He called for refills of tikosyn and this appointment was made.  Even though on our side Epic shows refill for tikosyn being refilled early August, pt reports that the drug store has failed to notify him of refills and  is down to one pill as of 3rd day today of tikosyn vrs bid as ordered. . Of course, he is in afib today and reports that he has been winded with activity since yesterday. Taking eliquis 5 mg bid.   Today, he denies symptoms of palpitations, chest pain, orthopnea, PND, lower extremity edema, dizziness, presyncope, syncope, or neurologic sequela. + for  shortness of breath with afib. The patient is tolerating medications without difficulties and is otherwise without complaint today.   Past Medical History:  Diagnosis Date  . Atrial fibrillation ([place])   . CHF (congestive heart failure) ([place])   . Hepatitis C   . Hypertension   . Stroke ([place])   . Visit for monitoring Tikosyn therapy 03/26/2017   Past Surgical History:  Procedure Laterality Date  . CARDIAC DEFIBRILLATOR PLACEMENT  2015  . CARDIOVERSION N/A 10/10/2016   Procedure: CARDIOVERSION;  Surgeon: Dorothy Spark, MD;  Location: [facility] ENDOSCOPY;  Service: Cardiovascular;  Laterality: N/A;  . CARDIOVERSION N/A 03/27/2017   Procedure: CARDIOVERSION;  Surgeon: Jerline Pain, MD;  Location: [place];  Service: Cardiovascular;  Laterality: N/A;  . EYE SURGERY Left 1990  . IR PERCUTANEOUS ART THROMBECTOMY/INFUSION INTRACRANIAL INC DIAG ANGIO  09/05/2016  . IR RADIOLOGIST EVAL & MGMT  10/03/2016  . RADIOLOGY WITH ANESTHESIA N/A 09/05/2016   Procedure: RADIOLOGY WITH ANESTHESIA;  Surgeon: Luanne Bras, MD;  Location: [facility];  Service: Radiology;  Laterality: N/A;    Current Outpatient Medications  Medication Sig Dispense Refill  . carvedilol (COREG) 25 MG tablet TAKE 1 TABLET(25 MG) BY MOUTH TWICE DAILY WITH A MEAL 60 tablet 6  . Cyanocobalamin (B-12) 500 MCG TABS Take 500 mcg by mouth daily as needed (energy). 30 tablet 2  . diazepam (VALIUM) 5 MG tablet TAKE 1/2 TABLET(2.5 MG) BY  MOUTH EVERY 8 HOURS AS NEEDED FOR ANXIETY 45 tablet 0  . diltiazem (CARDIZEM CD) 120 MG 24 hr capsule TAKE ONE CAPSULE BY MOUTH AT BEDTIME 90 capsule 1  . dofetilide (TIKOSYN) 500 MCG capsule TAKE 1 CAPSULE BY MOUTH TWICE A DAY 180 capsule 0  . ELIQUIS 5 MG TABS tablet TAKE 1 TABLET BY MOUTH TWICE DAILY 60 tablet 5  . furosemide (LASIX) 40 MG tablet TAKE 1 TABLET BY MOUTH EVERY DAY 30 tablet 0  . gabapentin (NEURONTIN) 300 MG capsule TAKE 1 CAPSULE(300 MG) BY MOUTH THREE TIMES DAILY 180 capsule 0  . magnesium oxide (MAG-OX) 400 MG tablet Take 400 mg by mouth daily.    . potassium chloride SA (K-DUR,KLOR-CON) 20 MEQ tablet Take 1 tablet (20 mEq total) by mouth daily. 90 tablet 3  . busPIRone (BUSPAR) 7.5 MG tablet Take 7.5 mg by mouth daily as needed for depression.    . carvedilol (  COREG) 25 MG tablet TAKE 1 TABLET BY MOUTH TWICE DAILY WITH A MEAL 60 tablet 6  . Glecaprevir-Pibrentasvir (MAVYRET) 100-40 MG TABS Take 3 tablets by mouth daily with supper. (Patient not taking: Reported on 10/13/2018) 84 tablet 1  . hydrALAZINE (APRESOLINE) 25 MG tablet Take 1 tablet (25 mg total) by mouth 2 (two) times daily. (Patient not taking: Reported on 10/13/2018) 60 tablet 11   No current facility-administered medications for this encounter.     Allergies  Allergen Reactions  . Benadryl [Diphenhydramine] Palpitations    Social History   Socioeconomic History  . Marital status: Single    Spouse name: Not on file  . Number of children: Not on file  . Years of education: 46 (some college)  . Highest education level: Not on file  Occupational History  . Occupation: disability  Social Needs  . Financial resource strain: Not on file  . Food insecurity    Worry: Not on file    Inability: Not on file  . Transportation needs    Medical: Not on file    Non-medical: Not on file  Tobacco Use  . Smoking status: Current Some Day Smoker    Packs/day: 0.50    Types: Cigarettes  . Smokeless tobacco: Never  Used  . Tobacco comment: a pack last three days  Substance and Sexual Activity  . Alcohol use: Not Currently    Alcohol/week: 3.0 standard drinks    Types: 3 Cans of beer per week    Comment: pt stop drinking   . Drug use: Yes    Frequency: 2.0 times per week    Types: Marijuana    Comment: stop smoking   . Sexual activity: Yes    Partners: Female    Birth control/protection: Condom  Lifestyle  . Physical activity    Days per week: Not on file    Minutes per session: Not on file  . Stress: Not on file  Relationships  . Social Herbalist on phone: Not on file    Gets together: Not on file    Attends religious service: Not on file    Active member of club or organization: Not on file    Attends meetings of clubs or organizations: Not on file    Relationship status: Not on file  . Intimate partner violence    Fear of current or ex partner: Not on file    Emotionally abused: Not on file    Physically abused: Not on file    Forced sexual activity: Not on file  Other Topics Concern  . Not on file  Social History Narrative  . Not on file    Family History  Problem Relation Age of Onset  . Stroke Maternal Aunt   . Heart disease Neg Hx     ROS- All systems are reviewed and negative except as per the HPI above  Physical Exam: Vitals:   10/13/18 1042  BP: (!) 146/76  Pulse: (!) 112  Weight: 93.3 kg  Height: 5\' 7"  (1.702 m)   Wt Readings from Last 3 Encounters:  10/13/18 93.3 kg  02/28/18 91.9 kg  12/02/17 85.5 kg    Labs: Lab Results  Component Value Date   NA 141 12/03/2017   K 3.9 12/03/2017   CL 104 12/03/2017   CO2 32 12/03/2017   GLUCOSE 66 12/03/2017   BUN 8 12/03/2017   CREATININE 1.40 (H) 12/03/2017   CALCIUM 9.4 12/03/2017   PHOS 2.7  09/07/2016   MG 1.8 09/20/2017   Lab Results  Component Value Date   INR 1.0 05/21/2017   Lab Results  Component Value Date   CHOL 96 09/06/2016   HDL 26 (L) 09/06/2016   LDLCALC 44 09/06/2016    TRIG 128 09/06/2016     GEN- The patient is well appearing, alert and oriented x 3 today.   Head- normocephalic, atraumatic Eyes-  Sclera clear, conjunctiva pink Ears- hearing intact Oropharynx- clear Neck- supple, no JVP Lymph- no cervical lymphadenopathy Lungs- Clear to ausculation bilaterally, normal work of breathing Heart-irregular rate and rhythm, no murmurs, rubs or gallops, PMI not laterally displaced GI- soft, NT, ND, + BS Extremities- no clubbing, cyanosis, or edema MS- no significant deformity or atrophy Skin- no rash or lesion Psych- euthymic mood, full affect Neuro- strength and sensation are intact  EKG-  afib at 112  bpm  Epic records reviewed Echo-Study Conclusions  - Left ventricle: The cavity size was normal. Systolic function was   moderately to severely reduced. The estimated ejection fraction   was in the range of 30% to 35%. Diffuse hypokinesis. Moderate   hypokinesis of the anteroseptal, inferior, and inferoseptal   myocardium. - Aortic valve: Transvalvular velocity was within the normal range.   There was no stenosis. There was moderate regurgitation. Valve   area (VTI): 2.25 cm^2. Valve area (Vmax): 2.37 cm^2. Valve area   (Vmean): 2.34 cm^2. - Mitral valve: Transvalvular velocity was within the normal range.   There was no evidence for stenosis. There was mild regurgitation. - Left atrium: The atrium was moderately dilated. - Right ventricle: The cavity size was normal. Wall thickness was   normal. Systolic function was mildly reduced. - Right atrium: The atrium was moderately dilated. - Tricuspid valve: There was mild-moderate regurgitation. - Pulmonary arteries: Systolic pressure was mildly increased. PA   peak pressure: 38 mm Hg (S).   Assessment and Plan: 1. Persistent afib  In afib today Unsure how long he has been in afib, but he reports exertional shortness of breath since yesterday and decreased his Tikosyn daily since the weekend as  he was trying to make his pills last as he  did not think he had refills Confirmed with the drug store today that indeed the medicine is there and to get back on BId dosing of tikosyn Avoid missing doses in the future  qtc appears long today but his in out of rhythm with RVR and is likely not accurate I will see back for ekg on Wednesday after resuming tikosyn bid  Continue cardizem 120 mg qd Continue to take eliquis 5 mg bid, pt reports no missed doses   Bmet/mag/cbc today   F/u with ekg on Wednesday    Glenn [name] C. Braylynn Ghan, [facility] [address] [place],  [address] [phone]

## 2018-10-15 ENCOUNTER — Ambulatory Visit (HOSPITAL_COMMUNITY)
Admission: RE | Admit: 2018-10-15 | Discharge: 2018-10-15 | Disposition: A | Discharge status: Home / Self Care | Payer: Medicare HMO | Source: Ambulatory Visit | Attending: Nurse Practitioner | Admitting: Nurse Practitioner

## 2018-10-15 ENCOUNTER — Other Ambulatory Visit: Payer: Self-pay

## 2018-10-15 ENCOUNTER — Encounter (HOSPITAL_COMMUNITY): Payer: Self-pay | Admitting: Nurse Practitioner

## 2018-10-15 DIAGNOSIS — Z79899 Other long term (current) drug therapy: Secondary | ICD-10-CM | POA: Insufficient documentation

## 2018-10-15 DIAGNOSIS — I4891 Unspecified atrial fibrillation: Secondary | ICD-10-CM | POA: Diagnosis not present

## 2018-10-15 MED ORDER — FUROSEMIDE 40 MG PO TABS: 40.0000 mg | ORAL_TABLET | Freq: Every day | ORAL | 1 refills | Status: DC

## 2018-10-15 NOTE — Progress Notes (Signed)
Pt in for EKG after back on tikoyn bid after only taking one tab a day for 3 days 2/2 refill issues. He is now in a fib flutter at 106 bpm, qrs int 96 ms, qtc 454 ms. He feels better. I will bring back in one week for ekg and id still out of rhythm will schedule for cardioversion.

## 2018-10-16 ENCOUNTER — Encounter: Payer: Self-pay | Admitting: Cardiology

## 2018-10-22 ENCOUNTER — Other Ambulatory Visit: Payer: Self-pay

## 2018-10-22 ENCOUNTER — Ambulatory Visit (HOSPITAL_COMMUNITY)
Admission: RE | Admit: 2018-10-22 | Discharge: 2018-10-22 | Disposition: A | Discharge status: Home / Self Care | Payer: Medicare HMO | Source: Ambulatory Visit | Attending: Nurse Practitioner | Admitting: Nurse Practitioner

## 2018-10-22 ENCOUNTER — Encounter (HOSPITAL_COMMUNITY): Payer: Self-pay | Admitting: Nurse Practitioner

## 2018-10-22 ENCOUNTER — Other Ambulatory Visit: Payer: Self-pay | Admitting: Family Medicine

## 2018-10-22 VITALS — BP 100/72 | HR 119 | Ht 67.0 in | Wt 211.8 lb

## 2018-10-22 DIAGNOSIS — F17210 Nicotine dependence, cigarettes, uncomplicated: Secondary | ICD-10-CM | POA: Insufficient documentation

## 2018-10-22 DIAGNOSIS — I480 Paroxysmal atrial fibrillation: Secondary | ICD-10-CM | POA: Diagnosis not present

## 2018-10-22 DIAGNOSIS — Z888 Allergy status to other drugs, medicaments and biological substances status: Secondary | ICD-10-CM | POA: Diagnosis not present

## 2018-10-22 DIAGNOSIS — Z8673 Personal history of transient ischemic attack (TIA), and cerebral infarction without residual deficits: Secondary | ICD-10-CM | POA: Insufficient documentation

## 2018-10-22 DIAGNOSIS — I509 Heart failure, unspecified: Secondary | ICD-10-CM | POA: Diagnosis not present

## 2018-10-22 DIAGNOSIS — Z79899 Other long term (current) drug therapy: Secondary | ICD-10-CM | POA: Diagnosis not present

## 2018-10-22 DIAGNOSIS — R9431 Abnormal electrocardiogram [ECG] [EKG]: Secondary | ICD-10-CM | POA: Insufficient documentation

## 2018-10-22 DIAGNOSIS — Z823 Family history of stroke: Secondary | ICD-10-CM | POA: Insufficient documentation

## 2018-10-22 DIAGNOSIS — F329 Major depressive disorder, single episode, unspecified: Secondary | ICD-10-CM | POA: Insufficient documentation

## 2018-10-22 DIAGNOSIS — I4819 Other persistent atrial fibrillation: Secondary | ICD-10-CM | POA: Diagnosis not present

## 2018-10-22 DIAGNOSIS — I4892 Unspecified atrial flutter: Secondary | ICD-10-CM | POA: Diagnosis not present

## 2018-10-22 DIAGNOSIS — Z7901 Long term (current) use of anticoagulants: Secondary | ICD-10-CM | POA: Diagnosis not present

## 2018-10-22 DIAGNOSIS — I110 Hypertensive heart disease with heart failure: Secondary | ICD-10-CM | POA: Insufficient documentation

## 2018-10-22 LAB — CBC
HCT: 37.2 % — ABNORMAL LOW (ref 39.0–52.0)
Hemoglobin: 12.3 g/dL — ABNORMAL LOW (ref 13.0–17.0)
MCH: 32.1 pg (ref 26.0–34.0)
MCHC: 33.1 g/dL (ref 30.0–36.0)
MCV: 97.1 fL (ref 80.0–100.0)
Platelets: 155 10*3/uL (ref 150–400)
RBC: 3.83 MIL/uL — ABNORMAL LOW (ref 4.22–5.81)
RDW: 13.5 % (ref 11.5–15.5)
WBC: 3.3 10*3/uL — ABNORMAL LOW (ref 4.0–10.5)
nRBC: 0 % (ref 0.0–0.2)

## 2018-10-22 NOTE — Progress Notes (Signed)
Primary Care Physician: [name], MD Referring Physician: Dr. Siri [name] [name] is a 68 y.o. male with a h/o afib on Xarelto with ICD, CHF, hepatitis C and hypertension who presented to [facility] 09/05/2016, with left MCA CVA.He did not receive TPA due to arriving outside the treatment window and being on Xarelto. He underwent chemical thrombectomy for left M2 occlusion. Patient was switched to Eliquis for secondary stroke prevention and aspirin 81 mg was added given his cardiac history. 2-D echo in the hospital LVEF 30-35%.   Patient moved here from [place] in May. He has a 2 year history of Atrial fibrillation and had an ICD-[employer] 12/2015  implanted.Says he had a heart cath that showed no blockages but he has h/o CHF. Smokes 1 ppd 30-40 yrs. Says he had chest tightness and shortness of breath for about a week prior to CVA. Also missed a couple of Xarelto doses.   Patient saw Dr. [name] as a new patient 09/19/16. Device interrogation demonstrated rapid atrial rates with averages over 100 bpm. He also had acute on chronic CHF. Dr. [name] felt the atrial fibrillation was likely contributing to his heart failure. Low-dose diltiazem was added despite the fact that he had CHF,based on the presumption that his atrial fibrillation rate was contributing to his heart failure. He also wanted to attempt cardioversion . His LA size has discordant measurements. He felt if he could maintain sinus rhythm and potentially dofetilide or amiodarone might be beneficial. Lasix was increased to 40 mg daily.   He underwent successful cardioversion 10/10/16.  In the ED 02/14/17 with left chest and left shoulder pain that was reproducible to touch.  He was discharged on tramadol.   Patient was seen by [name] 03/19/17.  He had stopped his lisinopril for 2 months because it was making him feel dizzy but he started it back 3 days ago taking it at night.  He says this has helped.  Blood pressure  was quite low when he arrived at 78/46 but on recheck it was 98/46.  He also says his heart is skipping and racing especially at night when he lays down.  His breathing is stable.  Complaining of chronic aching pain in his shoulder and neck.   He said his tramadol made him feel like a zombie and he cannot tolerate it. Dr. [name] consulted re pt and  did discuss possible dofetilide or amiodarone.  Pt  in the afib clinic, 03/25/17, for discussion of admission for Tikosyn. Pt has medicare/medicaid and cost should not be a problem. Specifics re Tikosyn discussed with pt, especially that he has to take specifically as written and can not miss doses, f/u is very important as scheduled with labs/ekg's. He would like to proceed. PharmD has reviewed drugs and no contraindicated or QTc prolonging medications. Denies any benadryl use, no missed doses of eliquis.    F/u hospitalization [facility] 2/4 thru 2/7 for tikosyn loading. He had successful cardioversion and left the hospital in [place] with stable qtc. He is taking Tikosyn correctly and feels improved. He has noted some issues with erectile dysfunction.  F/u in afib clinic, 07/25/17 pt is in afib. He felt some irregular HB yesterday. Cardizem was stopped in March with Dr. [name] 2/2 fatigue, with  HR in the low 50's. Pt is planning to leave for [place] for 2-3 weeks this weekend. Will place back on cardizem as he has RVR today.   Return to [place] 8/1. Ekg shows  a fib with occasional v paced complexes. He feels pretty good.  [name] with BS interrogated device, he does not have an atrial lead which would reveal more info  but by other markers, [name] feels he has been persistent for a while. Pt states that he is compliant with Tikosyn and eliquis. Will schedule  for cardioversion. HE continues to smoke. Drinks 2 beers 2x a week.  F/u in afib clinic, 10/13/18 for refills as pt has not been seen in cardiology for a year. He called for refills of tikosyn and this appointment was made.  Even though on our side Epic shows refill for tikosyn being refilled early August, pt reports that the drug store has failed to notify him of refills and  is down to one pill as of 3rd day today of tikosyn vrs bid as ordered. . Of course, he is in afib today and reports that he has been winded with activity since yesterday. Taking eliquis 5 mg bid.  F/u  in afib clinic, 10/23/18, pt remains out of rhythm, will set up for cardioversion. Has noted more exertional dyspnea. Weight up 3 lbs, will increase diuretic x 3 days.    Today, he denies symptoms of palpitations, chest pain, orthopnea, PND, lower extremity edema, dizziness, presyncope, syncope, or neurologic sequela. + for  shortness of breath with afib. The patient is tolerating medications without difficulties and is otherwise without complaint today.   Past Medical History:  Diagnosis Date  . Atrial fibrillation ([place])   . CHF (congestive heart failure) ([place])   . Hepatitis C   . Hypertension   . Stroke ([place])   . Visit for monitoring Tikosyn therapy 03/26/2017   Past Surgical History:  Procedure Laterality Date  . CARDIAC DEFIBRILLATOR PLACEMENT  2015  . CARDIOVERSION N/A 10/10/2016   Procedure: CARDIOVERSION;  Surgeon: Dorothy Spark, MD;  Location: [facility] ENDOSCOPY;  Service: Cardiovascular;  Laterality: N/A;  . CARDIOVERSION N/A 03/27/2017   Procedure: CARDIOVERSION;  Surgeon: Jerline Pain, MD;  Location: [place];  Service: Cardiovascular;  Laterality: N/A;  . EYE SURGERY Left 1990  . IR PERCUTANEOUS ART THROMBECTOMY/INFUSION INTRACRANIAL INC DIAG ANGIO  09/05/2016  . IR RADIOLOGIST EVAL & MGMT  10/03/2016  . RADIOLOGY WITH ANESTHESIA N/A 09/05/2016   Procedure: RADIOLOGY WITH ANESTHESIA;  Surgeon: Luanne Bras, MD;  Location: [place];  Service: Radiology;  Laterality: N/A;    Current Outpatient Medications  Medication Sig Dispense Refill  . busPIRone (BUSPAR) 7.5 MG tablet Take 7.5 mg by mouth daily as needed for depression.     . carvedilol (COREG) 25 MG tablet TAKE 1 TABLET(25 MG) BY MOUTH TWICE DAILY WITH A MEAL 60 tablet 6  . carvedilol (COREG) 25 MG tablet TAKE 1 TABLET BY MOUTH TWICE DAILY WITH A MEAL 60 tablet 6  . Cyanocobalamin (B-12) 500 MCG TABS Take 500 mcg by mouth daily as needed (energy). 30 tablet 2  . diazepam (VALIUM) 5 MG tablet TAKE 1/2 TABLET(2.5 MG) BY MOUTH EVERY 8 HOURS AS NEEDED FOR ANXIETY 45 tablet 0  . diltiazem (CARDIZEM CD) 120 MG 24 hr capsule TAKE ONE CAPSULE BY MOUTH AT BEDTIME 90 capsule 1  . dofetilide (TIKOSYN) 500 MCG capsule TAKE 1 CAPSULE BY MOUTH TWICE A DAY 180 capsule 0  . ELIQUIS 5 MG TABS tablet TAKE 1 TABLET BY MOUTH TWICE DAILY 60 tablet 5  . furosemide (LASIX) 40 MG tablet Take 1 tablet (40 mg total) by mouth daily. 90 tablet 1  . gabapentin (NEURONTIN)  300 MG capsule TAKE 1 CAPSULE(300 MG) BY MOUTH THREE TIMES DAILY 180 capsule 0  . Glecaprevir-Pibrentasvir (MAVYRET) 100-40 MG TABS Take 3 tablets by mouth daily with supper. 84 tablet 1  . hydrALAZINE (APRESOLINE) 25 MG tablet Take 1 tablet (25 mg total) by mouth 2 (two) times daily. 60 tablet 11  . magnesium oxide (MAG-OX) 400 MG tablet Take 400 mg by mouth daily.    . potassium chloride SA (K-DUR,KLOR-CON) 20 MEQ tablet Take 1 tablet (20 mEq total) by mouth daily. 90 tablet 3   No current facility-administered medications for this encounter.     Allergies  Allergen Reactions  . Benadryl [Diphenhydramine] Palpitations    Social History   Socioeconomic History  . Marital status: Single    Spouse name: Not on file  . Number of children: Not on file  . Years of education: 33 (some college)  . Highest education level: Not on file  Occupational History  . Occupation: disability  Social Needs  . Financial resource strain: Not on file  . Food insecurity    Worry: Not on file    Inability: Not on file  . Transportation needs    Medical: Not on file    Non-medical: Not on file  Tobacco Use  . Smoking status:  Current Some Day Smoker    Packs/day: 0.50    Types: Cigarettes  . Smokeless tobacco: Never Used  . Tobacco comment: a pack last three days  Substance and Sexual Activity  . Alcohol use: Not Currently    Alcohol/week: 3.0 standard drinks    Types: 3 Cans of beer per week    Comment: pt stop drinking   . Drug use: Yes    Frequency: 2.0 times per week    Types: Marijuana    Comment: stop smoking   . Sexual activity: Yes    Partners: Female    Birth control/protection: Condom  Lifestyle  . Physical activity    Days per week: Not on file    Minutes per session: Not on file  . Stress: Not on file  Relationships  . Social Herbalist on phone: Not on file    Gets together: Not on file    Attends religious service: Not on file    Active member of club or organization: Not on file    Attends meetings of clubs or organizations: Not on file    Relationship status: Not on file  . Intimate partner violence    Fear of current or ex partner: Not on file    Emotionally abused: Not on file    Physically abused: Not on file    Forced sexual activity: Not on file  Other Topics Concern  . Not on file  Social History Narrative  . Not on file    Family History  Problem Relation Age of Onset  . Stroke Maternal Aunt   . Heart disease Neg Hx     ROS- All systems are reviewed and negative except as per the HPI above  Physical Exam: Vitals:   10/22/18 1132  BP: 100/72  Pulse: (!) 119  Weight: 96.1 kg  Height: 5\' 7"  (1.702 m)   Wt Readings from Last 3 Encounters:  10/22/18 96.1 kg  10/15/18 93.4 kg  10/13/18 93.3 kg    Labs: Lab Results  Component Value Date   NA 140 10/13/2018   K 4.1 10/13/2018   CL 110 10/13/2018   CO2 23 10/13/2018   GLUCOSE  117 (H) 10/13/2018   BUN 12 10/13/2018   CREATININE 1.18 10/13/2018   CALCIUM 9.2 10/13/2018   PHOS 2.7 09/07/2016   MG 1.8 10/13/2018   Lab Results  Component Value Date   INR 1.0 05/21/2017   Lab Results   Component Value Date   CHOL 96 09/06/2016   HDL 26 (L) 09/06/2016   LDLCALC 44 09/06/2016   TRIG 128 09/06/2016     GEN- The patient is well appearing, alert and oriented x 3 today.   Head- normocephalic, atraumatic Eyes-  Sclera clear, conjunctiva pink Ears- hearing intact Oropharynx- clear Neck- supple, no JVP Lymph- no cervical lymphadenopathy Lungs- Clear to ausculation bilaterally, normal work of breathing Heart-irregular rate and rhythm, no murmurs, rubs or gallops, PMI not laterally displaced GI- soft, NT, ND, + BS Extremities- no clubbing, cyanosis, or edema MS- no significant deformity or atrophy Skin- no rash or lesion Psych- euthymic mood, full affect Neuro- strength and sensation are intact  EKG-  afib at 119  bpm, qrs int 104 ms, qtc 543 ms Epic records reviewed Echo-Study Conclusions  - Left ventricle: The cavity size was normal. Systolic function was   moderately to severely reduced. The estimated ejection fraction   was in the range of 30% to 35%. Diffuse hypokinesis. Moderate   hypokinesis of the anteroseptal, inferior, and inferoseptal   myocardium. - Aortic valve: Transvalvular velocity was within the normal range.   There was no stenosis. There was moderate regurgitation. Valve   area (VTI): 2.25 cm^2. Valve area (Vmax): 2.37 cm^2. Valve area   (Vmean): 2.34 cm^2. - Mitral valve: Transvalvular velocity was within the normal range.   There was no evidence for stenosis. There was mild regurgitation. - Left atrium: The atrium was moderately dilated. - Right ventricle: The cavity size was normal. Wall thickness was   normal. Systolic function was mildly reduced. - Right atrium: The atrium was moderately dilated. - Tricuspid valve: There was mild-moderate regurgitation. - Pulmonary arteries: Systolic pressure was mildly increased. PA   peak pressure: 38 mm Hg (S).   Assessment and Plan: 1. Persistent afib  Has been in afib for a couple of  weeks  since missing a few of his Tikosyn doses    Will plan for cardioversion 9/9 qtc appears long today but his in out of rhythm with RVR and is likely not accurate Continue cardizem 120 mg qd Continue to take eliquis 5 mg bid, pt reports no missed doses  For at least 3 weeks Pre procedure labs today  Wt up 3 lbs with increased shortness of breath, increase lasix to 60 mg qd x 3 days then return to 40 mg daily   F/u one week after cardioversion   Glenn [name], [facility] [address] [place], [place] [address] [phone]

## 2018-10-22 NOTE — H&P (View-Only) (Signed)
Primary Care Physician: [name], MD Referring Physician: Dr. Siri Cole Rabbani is a 68 y.o. male with a h/o afib on Xarelto with ICD, CHF, hepatitis C and hypertension who presented to [facility] 09/05/2016, with left MCA CVA.He did not receive TPA due to arriving outside the treatment window and being on Xarelto. He underwent chemical thrombectomy for left M2 occlusion. Patient was switched to Eliquis for secondary stroke prevention and aspirin 81 mg was added given his cardiac history. 2-D echo in the hospital LVEF 30-35%.   Patient moved here from [place] in May. He has a 2 year history of Atrial fibrillation and had an ICD-[employer] 12/2015  implanted.Says he had a heart cath that showed no blockages but he has h/o CHF. Smokes 1 ppd 30-40 yrs. Says he had chest tightness and shortness of breath for about a week prior to CVA. Also missed a couple of Xarelto doses.   Patient saw Dr. [name] as a new patient 09/19/16. Device interrogation demonstrated rapid atrial rates with averages over 100 bpm. He also had acute on chronic CHF. Dr. [name] felt the atrial fibrillation was likely contributing to his heart failure. Low-dose diltiazem was added despite the fact that he had CHF,based on the presumption that his atrial fibrillation rate was contributing to his heart failure. He also wanted to attempt cardioversion . His LA size has discordant measurements. He felt if he could maintain sinus rhythm and potentially dofetilide or amiodarone might be beneficial. Lasix was increased to 40 mg daily.   He underwent successful cardioversion 10/10/16.  In the ED 02/14/17 with left chest and left shoulder pain that was reproducible to touch.  He was discharged on tramadol.   Patient was seen by [name] 03/19/17.  He had stopped his lisinopril for 2 months because it was making him feel dizzy but he started it back 3 days ago taking it at night.  He says this has helped.  Blood pressure  was quite low when he arrived at 78/46 but on recheck it was 98/46.  He also says his heart is skipping and racing especially at night when he lays down.  His breathing is stable.  Complaining of chronic aching pain in his shoulder and neck.   He said his tramadol made him feel like a zombie and he cannot tolerate it. Dr. [name] consulted re pt and  did discuss possible dofetilide or amiodarone.  Pt  in the afib clinic, 03/25/17, for discussion of admission for Tikosyn. Pt has medicare/medicaid and cost should not be a problem. Specifics re Tikosyn discussed with pt, especially that he has to take specifically as written and can not miss doses, f/u is very important as scheduled with labs/ekg's. He would like to proceed. PharmD has reviewed drugs and no contraindicated or QTc prolonging medications. Denies any benadryl use, no missed doses of eliquis.    F/u hospitalization [facility] 2/4 thru 2/7 for tikosyn loading. He had successful cardioversion and left the hospital in [place] with stable qtc. He is taking Tikosyn correctly and feels improved. He has noted some issues with erectile dysfunction.  F/u in afib clinic, 07/25/17 pt is in afib. He felt some irregular HB yesterday. Cardizem was stopped in March with Dr. [name] 2/2 fatigue, with  HR in the low 50's. Pt is planning to leave for [place] for 2-3 weeks this weekend. Will place back on cardizem as he has RVR today.   Return to [place] 8/1. Ekg shows  a fib with occasional v paced complexes. He feels pretty good.  [name] with BS interrogated device, he does not have an atrial lead which would reveal more info  but by other markers, [name] feels he has been persistent for a while. Pt states that he is compliant with Tikosyn and eliquis. Will schedule  for cardioversion. HE continues to smoke. Drinks 2 beers 2x a week.  F/u in afib clinic, 10/13/18 for refills as pt has not been seen in cardiology for a year. He called for refills of tikosyn and this appointment was made.  Even though on our side Epic shows refill for tikosyn being refilled early August, pt reports that the drug store has failed to notify him of refills and  is down to one pill as of 3rd day today of tikosyn vrs bid as ordered. . Of course, he is in afib today and reports that he has been winded with activity since yesterday. Taking eliquis 5 mg bid.  F/u  in afib clinic, 10/23/18, pt remains out of rhythm, will set up for cardioversion. Has noted more exertional dyspnea. Weight up 3 lbs, will increase diuretic x 3 days.    Today, he denies symptoms of palpitations, chest pain, orthopnea, PND, lower extremity edema, dizziness, presyncope, syncope, or neurologic sequela. + for  shortness of breath with afib. The patient is tolerating medications without difficulties and is otherwise without complaint today.   Past Medical History:  Diagnosis Date  . Atrial fibrillation ([place])   . CHF (congestive heart failure) ([place])   . Hepatitis C   . Hypertension   . Stroke ([place])   . Visit for monitoring Tikosyn therapy 03/26/2017   Past Surgical History:  Procedure Laterality Date  . CARDIAC DEFIBRILLATOR PLACEMENT  2015  . CARDIOVERSION N/A 10/10/2016   Procedure: CARDIOVERSION;  Surgeon: Dorothy Spark, MD;  Location: [facility] ENDOSCOPY;  Service: Cardiovascular;  Laterality: N/A;  . CARDIOVERSION N/A 03/27/2017   Procedure: CARDIOVERSION;  Surgeon: Jerline Pain, MD;  Location: [place];  Service: Cardiovascular;  Laterality: N/A;  . EYE SURGERY Left 1990  . IR PERCUTANEOUS ART THROMBECTOMY/INFUSION INTRACRANIAL INC DIAG ANGIO  09/05/2016  . IR RADIOLOGIST EVAL & MGMT  10/03/2016  . RADIOLOGY WITH ANESTHESIA N/A 09/05/2016   Procedure: RADIOLOGY WITH ANESTHESIA;  Surgeon: Luanne Bras, MD;  Location: [place];  Service: Radiology;  Laterality: N/A;    Current Outpatient Medications  Medication Sig Dispense Refill  . busPIRone (BUSPAR) 7.5 MG tablet Take 7.5 mg by mouth daily as needed for depression.     . carvedilol (COREG) 25 MG tablet TAKE 1 TABLET(25 MG) BY MOUTH TWICE DAILY WITH A MEAL 60 tablet 6  . carvedilol (COREG) 25 MG tablet TAKE 1 TABLET BY MOUTH TWICE DAILY WITH A MEAL 60 tablet 6  . Cyanocobalamin (B-12) 500 MCG TABS Take 500 mcg by mouth daily as needed (energy). 30 tablet 2  . diazepam (VALIUM) 5 MG tablet TAKE 1/2 TABLET(2.5 MG) BY MOUTH EVERY 8 HOURS AS NEEDED FOR ANXIETY 45 tablet 0  . diltiazem (CARDIZEM CD) 120 MG 24 hr capsule TAKE ONE CAPSULE BY MOUTH AT BEDTIME 90 capsule 1  . dofetilide (TIKOSYN) 500 MCG capsule TAKE 1 CAPSULE BY MOUTH TWICE A DAY 180 capsule 0  . ELIQUIS 5 MG TABS tablet TAKE 1 TABLET BY MOUTH TWICE DAILY 60 tablet 5  . furosemide (LASIX) 40 MG tablet Take 1 tablet (40 mg total) by mouth daily. 90 tablet 1  . gabapentin (NEURONTIN)  300 MG capsule TAKE 1 CAPSULE(300 MG) BY MOUTH THREE TIMES DAILY 180 capsule 0  . Glecaprevir-Pibrentasvir (MAVYRET) 100-40 MG TABS Take 3 tablets by mouth daily with supper. 84 tablet 1  . hydrALAZINE (APRESOLINE) 25 MG tablet Take 1 tablet (25 mg total) by mouth 2 (two) times daily. 60 tablet 11  . magnesium oxide (MAG-OX) 400 MG tablet Take 400 mg by mouth daily.    . potassium chloride SA (K-DUR,KLOR-CON) 20 MEQ tablet Take 1 tablet (20 mEq total) by mouth daily. 90 tablet 3   No current facility-administered medications for this encounter.     Allergies  Allergen Reactions  . Benadryl [Diphenhydramine] Palpitations    Social History   Socioeconomic History  . Marital status: Single    Spouse name: Not on file  . Number of children: Not on file  . Years of education: 29 (some college)  . Highest education level: Not on file  Occupational History  . Occupation: disability  Social Needs  . Financial resource strain: Not on file  . Food insecurity    Worry: Not on file    Inability: Not on file  . Transportation needs    Medical: Not on file    Non-medical: Not on file  Tobacco Use  . Smoking status:  Current Some Day Smoker    Packs/day: 0.50    Types: Cigarettes  . Smokeless tobacco: Never Used  . Tobacco comment: a pack last three days  Substance and Sexual Activity  . Alcohol use: Not Currently    Alcohol/week: 3.0 standard drinks    Types: 3 Cans of beer per week    Comment: pt stop drinking   . Drug use: Yes    Frequency: 2.0 times per week    Types: Marijuana    Comment: stop smoking   . Sexual activity: Yes    Partners: Female    Birth control/protection: Condom  Lifestyle  . Physical activity    Days per week: Not on file    Minutes per session: Not on file  . Stress: Not on file  Relationships  . Social Herbalist on phone: Not on file    Gets together: Not on file    Attends religious service: Not on file    Active member of club or organization: Not on file    Attends meetings of clubs or organizations: Not on file    Relationship status: Not on file  . Intimate partner violence    Fear of current or ex partner: Not on file    Emotionally abused: Not on file    Physically abused: Not on file    Forced sexual activity: Not on file  Other Topics Concern  . Not on file  Social History Narrative  . Not on file    Family History  Problem Relation Age of Onset  . Stroke Maternal Aunt   . Heart disease Neg Hx     ROS- All systems are reviewed and negative except as per the HPI above  Physical Exam: Vitals:   10/22/18 1132  BP: 100/72  Pulse: (!) 119  Weight: 96.1 kg  Height: 5\' 7"  (1.702 m)   Wt Readings from Last 3 Encounters:  10/22/18 96.1 kg  10/15/18 93.4 kg  10/13/18 93.3 kg    Labs: Lab Results  Component Value Date   NA 140 10/13/2018   K 4.1 10/13/2018   CL 110 10/13/2018   CO2 23 10/13/2018   GLUCOSE  117 (H) 10/13/2018   BUN 12 10/13/2018   CREATININE 1.18 10/13/2018   CALCIUM 9.2 10/13/2018   PHOS 2.7 09/07/2016   MG 1.8 10/13/2018   Lab Results  Component Value Date   INR 1.0 05/21/2017   Lab Results   Component Value Date   CHOL 96 09/06/2016   HDL 26 (L) 09/06/2016   LDLCALC 44 09/06/2016   TRIG 128 09/06/2016     GEN- The patient is well appearing, alert and oriented x 3 today.   Head- normocephalic, atraumatic Eyes-  Sclera clear, conjunctiva pink Ears- hearing intact Oropharynx- clear Neck- supple, no JVP Lymph- no cervical lymphadenopathy Lungs- Clear to ausculation bilaterally, normal work of breathing Heart-irregular rate and rhythm, no murmurs, rubs or gallops, PMI not laterally displaced GI- soft, NT, ND, + BS Extremities- no clubbing, cyanosis, or edema MS- no significant deformity or atrophy Skin- no rash or lesion Psych- euthymic mood, full affect Neuro- strength and sensation are intact  EKG-  afib at 119  bpm, qrs int 104 ms, qtc 543 ms Epic records reviewed Echo-Study Conclusions  - Left ventricle: The cavity size was normal. Systolic function was   moderately to severely reduced. The estimated ejection fraction   was in the range of 30% to 35%. Diffuse hypokinesis. Moderate   hypokinesis of the anteroseptal, inferior, and inferoseptal   myocardium. - Aortic valve: Transvalvular velocity was within the normal range.   There was no stenosis. There was moderate regurgitation. Valve   area (VTI): 2.25 cm^2. Valve area (Vmax): 2.37 cm^2. Valve area   (Vmean): 2.34 cm^2. - Mitral valve: Transvalvular velocity was within the normal range.   There was no evidence for stenosis. There was mild regurgitation. - Left atrium: The atrium was moderately dilated. - Right ventricle: The cavity size was normal. Wall thickness was   normal. Systolic function was mildly reduced. - Right atrium: The atrium was moderately dilated. - Tricuspid valve: There was mild-moderate regurgitation. - Pulmonary arteries: Systolic pressure was mildly increased. PA   peak pressure: 38 mm Hg (S).   Assessment and Plan: 1. Persistent afib  Has been in afib for a couple of  weeks  since missing a few of his Tikosyn doses    Will plan for cardioversion 9/9 qtc appears long today but his in out of rhythm with RVR and is likely not accurate Continue cardizem 120 mg qd Continue to take eliquis 5 mg bid, pt reports no missed doses  For at least 3 weeks Pre procedure labs today  Wt up 3 lbs with increased shortness of breath, increase lasix to 60 mg qd x 3 days then return to 40 mg daily   F/u one week after cardioversion   Butch Penny C. Natasha Burda, [facility] [address] [place], [place] [address] [phone]

## 2018-10-22 NOTE — Patient Instructions (Addendum)
Cardioversion scheduled for Wednesday, September 9th  - Arrive at the [facility] and go to admitting at 9:30AM  -Do not eat or drink anything after midnight the night prior to your procedure.  - Take all your morning medication with a sip of water prior to arrival.  - You will not be able to drive home after your procedure.  Lasix to 1 and 1/2 tablets (60mg )  for 3 days then back to normal dosing

## 2018-10-25 ENCOUNTER — Other Ambulatory Visit (HOSPITAL_COMMUNITY)
Admission: RE | Admit: 2018-10-25 | Discharge: 2018-10-25 | Disposition: A | Discharge status: Home / Self Care | Payer: Medicare HMO | Source: Ambulatory Visit | Attending: Cardiovascular Disease | Admitting: Cardiovascular Disease

## 2018-10-25 DIAGNOSIS — Z01812 Encounter for preprocedural laboratory examination: Secondary | ICD-10-CM | POA: Insufficient documentation

## 2018-10-25 DIAGNOSIS — Z20828 Contact with and (suspected) exposure to other viral communicable diseases: Secondary | ICD-10-CM | POA: Insufficient documentation

## 2018-10-26 LAB — NOVEL CORONAVIRUS, NAA (HOSP ORDER, SEND-OUT TO REF LAB; TAT 18-24 HRS): SARS-CoV-2, NAA: NOT DETECTED

## 2018-10-28 NOTE — Progress Notes (Addendum)
This RN spoke with pt to remain quarentined since covid test. Pt understands visitor policy. Pt instructed to come at  0930 tomorrow morning for cardioversion. Pt instructed on NPO status

## 2018-10-29 ENCOUNTER — Inpatient Hospital Stay (HOSPITAL_COMMUNITY)
Admission: EM | Admit: 2018-10-29 | Discharge: 2018-11-06 | DRG: 286 | Disposition: A | Discharge status: Home / Self Care | Payer: Medicare HMO | Attending: Family Medicine | Admitting: Family Medicine

## 2018-10-29 ENCOUNTER — Other Ambulatory Visit: Payer: Self-pay

## 2018-10-29 ENCOUNTER — Emergency Department (HOSPITAL_COMMUNITY): Payer: Medicare HMO

## 2018-10-29 ENCOUNTER — Encounter (HOSPITAL_COMMUNITY)
Admission: RE | Disposition: A | Discharge status: Home / Self Care | Payer: Self-pay | Source: Home / Self Care | Attending: Cardiovascular Disease

## 2018-10-29 ENCOUNTER — Ambulatory Visit (HOSPITAL_COMMUNITY): Payer: Medicare HMO | Admitting: Anesthesiology

## 2018-10-29 ENCOUNTER — Encounter (HOSPITAL_COMMUNITY): Payer: Self-pay

## 2018-10-29 ENCOUNTER — Ambulatory Visit (HOSPITAL_BASED_OUTPATIENT_CLINIC_OR_DEPARTMENT_OTHER)
Admission: RE | Admit: 2018-10-29 | Discharge: 2018-10-29 | Disposition: A | Discharge status: Home / Self Care | Payer: Medicare HMO | Source: Home / Self Care | Attending: Cardiovascular Disease | Admitting: Cardiovascular Disease

## 2018-10-29 DIAGNOSIS — I428 Other cardiomyopathies: Secondary | ICD-10-CM | POA: Diagnosis present

## 2018-10-29 DIAGNOSIS — F1490 Cocaine use, unspecified, uncomplicated: Secondary | ICD-10-CM | POA: Diagnosis present

## 2018-10-29 DIAGNOSIS — R7989 Other specified abnormal findings of blood chemistry: Secondary | ICD-10-CM | POA: Diagnosis not present

## 2018-10-29 DIAGNOSIS — R031 Nonspecific low blood-pressure reading: Secondary | ICD-10-CM | POA: Diagnosis present

## 2018-10-29 DIAGNOSIS — Z9119 Patient's noncompliance with other medical treatment and regimen: Secondary | ICD-10-CM

## 2018-10-29 DIAGNOSIS — Z79899 Other long term (current) drug therapy: Secondary | ICD-10-CM

## 2018-10-29 DIAGNOSIS — I472 Ventricular tachycardia: Secondary | ICD-10-CM

## 2018-10-29 DIAGNOSIS — I5023 Acute on chronic systolic (congestive) heart failure: Secondary | ICD-10-CM

## 2018-10-29 DIAGNOSIS — Z720 Tobacco use: Secondary | ICD-10-CM | POA: Diagnosis not present

## 2018-10-29 DIAGNOSIS — I5021 Acute systolic (congestive) heart failure: Secondary | ICD-10-CM | POA: Diagnosis not present

## 2018-10-29 DIAGNOSIS — R0602 Shortness of breath: Secondary | ICD-10-CM | POA: Diagnosis not present

## 2018-10-29 DIAGNOSIS — N183 Chronic kidney disease, stage 3 (moderate): Secondary | ICD-10-CM | POA: Diagnosis present

## 2018-10-29 DIAGNOSIS — I361 Nonrheumatic tricuspid (valve) insufficiency: Secondary | ICD-10-CM | POA: Diagnosis not present

## 2018-10-29 DIAGNOSIS — I4819 Other persistent atrial fibrillation: Secondary | ICD-10-CM

## 2018-10-29 DIAGNOSIS — I2510 Atherosclerotic heart disease of native coronary artery without angina pectoris: Secondary | ICD-10-CM | POA: Diagnosis present

## 2018-10-29 DIAGNOSIS — K5900 Constipation, unspecified: Secondary | ICD-10-CM | POA: Diagnosis present

## 2018-10-29 DIAGNOSIS — I4891 Unspecified atrial fibrillation: Secondary | ICD-10-CM | POA: Diagnosis present

## 2018-10-29 DIAGNOSIS — R9431 Abnormal electrocardiogram [ECG] [EKG]: Secondary | ICD-10-CM | POA: Diagnosis not present

## 2018-10-29 DIAGNOSIS — J441 Chronic obstructive pulmonary disease with (acute) exacerbation: Secondary | ICD-10-CM

## 2018-10-29 DIAGNOSIS — Z95810 Presence of automatic (implantable) cardiac defibrillator: Secondary | ICD-10-CM | POA: Diagnosis not present

## 2018-10-29 DIAGNOSIS — I110 Hypertensive heart disease with heart failure: Secondary | ICD-10-CM | POA: Insufficient documentation

## 2018-10-29 DIAGNOSIS — R0902 Hypoxemia: Secondary | ICD-10-CM | POA: Diagnosis not present

## 2018-10-29 DIAGNOSIS — I509 Heart failure, unspecified: Secondary | ICD-10-CM

## 2018-10-29 DIAGNOSIS — I420 Dilated cardiomyopathy: Secondary | ICD-10-CM | POA: Diagnosis not present

## 2018-10-29 DIAGNOSIS — F17210 Nicotine dependence, cigarettes, uncomplicated: Secondary | ICD-10-CM | POA: Diagnosis present

## 2018-10-29 DIAGNOSIS — R778 Other specified abnormalities of plasma proteins: Secondary | ICD-10-CM

## 2018-10-29 DIAGNOSIS — N179 Acute kidney failure, unspecified: Secondary | ICD-10-CM | POA: Diagnosis present

## 2018-10-29 DIAGNOSIS — I10 Essential (primary) hypertension: Secondary | ICD-10-CM | POA: Diagnosis not present

## 2018-10-29 DIAGNOSIS — R000 Tachycardia, unspecified: Secondary | ICD-10-CM

## 2018-10-29 DIAGNOSIS — Z888 Allergy status to other drugs, medicaments and biological substances status: Secondary | ICD-10-CM | POA: Diagnosis not present

## 2018-10-29 DIAGNOSIS — J9601 Acute respiratory failure with hypoxia: Secondary | ICD-10-CM | POA: Diagnosis present

## 2018-10-29 DIAGNOSIS — E785 Hyperlipidemia, unspecified: Secondary | ICD-10-CM | POA: Diagnosis present

## 2018-10-29 DIAGNOSIS — I639 Cerebral infarction, unspecified: Secondary | ICD-10-CM | POA: Diagnosis not present

## 2018-10-29 DIAGNOSIS — Z7901 Long term (current) use of anticoagulants: Secondary | ICD-10-CM | POA: Diagnosis not present

## 2018-10-29 DIAGNOSIS — Z20828 Contact with and (suspected) exposure to other viral communicable diseases: Secondary | ICD-10-CM | POA: Diagnosis present

## 2018-10-29 DIAGNOSIS — I480 Paroxysmal atrial fibrillation: Secondary | ICD-10-CM | POA: Diagnosis not present

## 2018-10-29 DIAGNOSIS — Z8673 Personal history of transient ischemic attack (TIA), and cerebral infarction without residual deficits: Secondary | ICD-10-CM | POA: Diagnosis not present

## 2018-10-29 DIAGNOSIS — I4811 Longstanding persistent atrial fibrillation: Secondary | ICD-10-CM | POA: Diagnosis not present

## 2018-10-29 DIAGNOSIS — I340 Nonrheumatic mitral (valve) insufficiency: Secondary | ICD-10-CM | POA: Diagnosis not present

## 2018-10-29 DIAGNOSIS — R0689 Other abnormalities of breathing: Secondary | ICD-10-CM | POA: Diagnosis not present

## 2018-10-29 DIAGNOSIS — B182 Chronic viral hepatitis C: Secondary | ICD-10-CM | POA: Diagnosis present

## 2018-10-29 DIAGNOSIS — R0600 Dyspnea, unspecified: Secondary | ICD-10-CM | POA: Diagnosis not present

## 2018-10-29 DIAGNOSIS — I130 Hypertensive heart and chronic kidney disease with heart failure and stage 1 through stage 4 chronic kidney disease, or unspecified chronic kidney disease: Principal | ICD-10-CM | POA: Diagnosis present

## 2018-10-29 DIAGNOSIS — R52 Pain, unspecified: Secondary | ICD-10-CM

## 2018-10-29 DIAGNOSIS — Z823 Family history of stroke: Secondary | ICD-10-CM

## 2018-10-29 DIAGNOSIS — R51 Headache: Secondary | ICD-10-CM | POA: Diagnosis not present

## 2018-10-29 DIAGNOSIS — J80 Acute respiratory distress syndrome: Secondary | ICD-10-CM | POA: Diagnosis not present

## 2018-10-29 DIAGNOSIS — Z9981 Dependence on supplemental oxygen: Secondary | ICD-10-CM

## 2018-10-29 HISTORY — PX: CARDIOVERSION: SHX1299

## 2018-10-29 LAB — POCT I-STAT EG7
Acid-base deficit: 5 mmol/L — ABNORMAL HIGH (ref 0.0–2.0)
Bicarbonate: 23.2 mmol/L (ref 20.0–28.0)
Calcium, Ion: 1.2 mmol/L (ref 1.15–1.40)
HCT: 42 % (ref 39.0–52.0)
Hemoglobin: 14.3 g/dL (ref 13.0–17.0)
O2 Saturation: 89 %
Potassium: 4.8 mmol/L (ref 3.5–5.1)
Sodium: 140 mmol/L (ref 135–145)
TCO2: 25 mmol/L (ref 22–32)
pCO2, Ven: 52.8 mmHg (ref 44.0–60.0)
pH, Ven: 7.25 (ref 7.250–7.430)
pO2, Ven: 67 mmHg — ABNORMAL HIGH (ref 32.0–45.0)

## 2018-10-29 LAB — CBC WITH DIFFERENTIAL/PLATELET
Abs Immature Granulocytes: 0.02 10*3/uL (ref 0.00–0.07)
Basophils Absolute: 0 10*3/uL (ref 0.0–0.1)
Basophils Relative: 0 %
Eosinophils Absolute: 0.3 10*3/uL (ref 0.0–0.5)
Eosinophils Relative: 5 %
HCT: 44.8 % (ref 39.0–52.0)
Hemoglobin: 14.2 g/dL (ref 13.0–17.0)
Immature Granulocytes: 0 %
Lymphocytes Relative: 47 %
Lymphs Abs: 3.3 10*3/uL (ref 0.7–4.0)
MCH: 32.2 pg (ref 26.0–34.0)
MCHC: 31.7 g/dL (ref 30.0–36.0)
MCV: 101.6 fL — ABNORMAL HIGH (ref 80.0–100.0)
Monocytes Absolute: 0.5 10*3/uL (ref 0.1–1.0)
Monocytes Relative: 7 %
Neutro Abs: 2.9 10*3/uL (ref 1.7–7.7)
Neutrophils Relative %: 41 %
Platelets: 222 10*3/uL (ref 150–400)
RBC: 4.41 MIL/uL (ref 4.22–5.81)
RDW: 14 % (ref 11.5–15.5)
WBC: 7.1 10*3/uL (ref 4.0–10.5)
nRBC: 0 % (ref 0.0–0.2)

## 2018-10-29 LAB — COMPREHENSIVE METABOLIC PANEL
ALT: 27 U/L (ref 0–44)
AST: 31 U/L (ref 15–41)
Albumin: 4.3 g/dL (ref 3.5–5.0)
Alkaline Phosphatase: 100 U/L (ref 38–126)
Anion gap: 8 (ref 5–15)
BUN: 16 mg/dL (ref 8–23)
CO2: 26 mmol/L (ref 22–32)
Calcium: 9.1 mg/dL (ref 8.9–10.3)
Chloride: 104 mmol/L (ref 98–111)
Creatinine, Ser: 1.94 mg/dL — ABNORMAL HIGH (ref 0.61–1.24)
GFR calc Af Amer: 41 mL/min — ABNORMAL LOW (ref >60)
GFR calc non Af Amer: 35 mL/min — ABNORMAL LOW (ref >60)
Glucose, Bld: 167 mg/dL — ABNORMAL HIGH (ref 70–99)
Potassium: 4.4 mmol/L (ref 3.5–5.1)
Sodium: 138 mmol/L (ref 135–145)
Total Bilirubin: 1.1 mg/dL (ref 0.3–1.2)
Total Protein: 7.9 g/dL (ref 6.5–8.1)

## 2018-10-29 LAB — POCT I-STAT, CHEM 8
BUN: 17 mg/dL (ref 8–23)
Calcium, Ion: 1.26 mmol/L (ref 1.15–1.40)
Chloride: 105 mmol/L (ref 98–111)
Creatinine, Ser: 1.2 mg/dL (ref 0.61–1.24)
Glucose, Bld: 120 mg/dL — ABNORMAL HIGH (ref 70–99)
HCT: 40 % (ref 39.0–52.0)
Hemoglobin: 13.6 g/dL (ref 13.0–17.0)
Potassium: 4.1 mmol/L (ref 3.5–5.1)
Sodium: 142 mmol/L (ref 135–145)
TCO2: 24 mmol/L (ref 22–32)

## 2018-10-29 LAB — CBG MONITORING, ED: Glucose-Capillary: 146 mg/dL — ABNORMAL HIGH (ref 70–99)

## 2018-10-29 LAB — TROPONIN I (HIGH SENSITIVITY): Troponin I (High Sensitivity): 45 ng/L — ABNORMAL HIGH (ref <18)

## 2018-10-29 LAB — BRAIN NATRIURETIC PEPTIDE: B Natriuretic Peptide: 1167.1 pg/mL — ABNORMAL HIGH (ref 0.0–100.0)

## 2018-10-29 LAB — LACTIC ACID, PLASMA: Lactic Acid, Venous: 1.1 mmol/L (ref 0.5–1.9)

## 2018-10-29 SURGERY — CARDIOVERSION
Anesthesia: General

## 2018-10-29 MED ORDER — SODIUM CHLORIDE 0.9 % IV SOLN
INTRAVENOUS | Status: DC
  Administered 2018-10-29: 10:00:00 via INTRAVENOUS

## 2018-10-29 MED ORDER — IPRATROPIUM-ALBUTEROL 20-100 MCG/ACT IN AERS
2.0000 | INHALATION_SPRAY | Freq: Four times a day (QID) | RESPIRATORY_TRACT | Status: DC
  Administered 2018-10-29 – 2018-10-30 (×3): 2 via RESPIRATORY_TRACT
  Filled 2018-10-29: qty 4

## 2018-10-29 MED ORDER — PHENYLEPHRINE 40 MCG/ML (10ML) SYRINGE FOR IV PUSH (FOR BLOOD PRESSURE SUPPORT)
PREFILLED_SYRINGE | INTRAVENOUS | Status: DC | PRN
  Administered 2018-10-29: 40 ug via INTRAVENOUS
  Administered 2018-10-29 (×4): 80 ug via INTRAVENOUS

## 2018-10-29 MED ORDER — ALBUTEROL SULFATE (2.5 MG/3ML) 0.083% IN NEBU: 5.0000 mg | INHALATION_SOLUTION | Freq: Once | RESPIRATORY_TRACT | Status: DC

## 2018-10-29 MED ORDER — LIDOCAINE 2% (20 MG/ML) 5 ML SYRINGE
INTRAMUSCULAR | Status: DC | PRN
  Administered 2018-10-29: 20 mg via INTRAVENOUS
  Administered 2018-10-29: 80 mg via INTRAVENOUS

## 2018-10-29 MED ORDER — CARVEDILOL 25 MG PO TABS: 25.0000 mg | ORAL_TABLET | Freq: Two times a day (BID) | ORAL | 3 refills | Status: DC

## 2018-10-29 MED ORDER — PROPOFOL 10 MG/ML IV BOLUS
INTRAVENOUS | Status: DC | PRN
  Administered 2018-10-29: 100 mg via INTRAVENOUS
  Administered 2018-10-29: 20 mg via INTRAVENOUS

## 2018-10-29 MED ORDER — ALBUTEROL SULFATE HFA 108 (90 BASE) MCG/ACT IN AERS
8.0000 | INHALATION_SPRAY | Freq: Once | RESPIRATORY_TRACT | Status: DC
  Filled 2018-10-29: qty 6.7

## 2018-10-29 MED ORDER — METHYLPREDNISOLONE SODIUM SUCC 125 MG IJ SOLR
125.0000 mg | Freq: Once | INTRAMUSCULAR | Status: AC
  Administered 2018-10-29: 125 mg via INTRAVENOUS
  Filled 2018-10-29: qty 2

## 2018-10-29 MED ORDER — EPHEDRINE SULFATE-NACL 50-0.9 MG/10ML-% IV SOSY
PREFILLED_SYRINGE | INTRAVENOUS | Status: DC | PRN
  Administered 2018-10-29: 10 mg via INTRAVENOUS
  Administered 2018-10-29: 5 mg via INTRAVENOUS
  Administered 2018-10-29 (×3): 10 mg via INTRAVENOUS

## 2018-10-29 MED ORDER — FUROSEMIDE 10 MG/ML IJ SOLN
40.0000 mg | Freq: Once | INTRAMUSCULAR | Status: AC
  Administered 2018-10-29: 40 mg via INTRAVENOUS
  Filled 2018-10-29: qty 4

## 2018-10-29 MED ORDER — IPRATROPIUM-ALBUTEROL 0.5-2.5 (3) MG/3ML IN SOLN: 3.0000 mL | RESPIRATORY_TRACT | Status: DC

## 2018-10-29 MED ORDER — MAGNESIUM SULFATE 2 GM/50ML IV SOLN
2.0000 g | Freq: Once | INTRAVENOUS | Status: AC
  Administered 2018-10-29: 2 g via INTRAVENOUS
  Filled 2018-10-29: qty 50

## 2018-10-29 NOTE — Anesthesia Preprocedure Evaluation (Signed)
Anesthesia Evaluation  Patient identified by MRN, date of birth, ID band Patient awake    Reviewed: Allergy & Precautions, NPO status , Patient's Chart, lab work & pertinent test results  Airway Mallampati: II  TM Distance: >3 FB Neck ROM: Full    Dental no notable dental hx. (+) Teeth Intact   Pulmonary Current Smoker,    Pulmonary exam normal breath sounds clear to auscultation       Cardiovascular hypertension, Pt. on medications Normal cardiovascular exam+ dysrhythmias Atrial Fibrillation + Cardiac Defibrillator  Rhythm:Regular Rate:Normal     Neuro/Psych  Neuromuscular disease CVA negative psych ROS   GI/Hepatic negative GI ROS, Neg liver ROS, (+) Hepatitis -, C  Endo/Other  negative endocrine ROS  Renal/GU negative Renal ROS     Musculoskeletal negative musculoskeletal ROS (+)   Abdominal   Peds  Hematology negative hematology ROS (+)   Anesthesia Other Findings   Reproductive/Obstetrics negative OB ROS                             Anesthesia Physical Anesthesia Plan  ASA: III  Anesthesia Plan: General   Post-op Pain Management:    Induction: Intravenous  PONV Risk Score and Plan: Treatment may vary due to age or medical condition  Airway Management Planned: Mask and Simple Face Mask  Additional Equipment:   Intra-op Plan:   Post-operative Plan:   Informed Consent: I have reviewed the patients History and Physical, chart, labs and discussed the procedure including the risks, benefits and alternatives for the proposed anesthesia with the patient or authorized representative who has indicated his/her understanding and acceptance.     Dental advisory given  Plan Discussed with:   Anesthesia Plan Comments:         Anesthesia Quick Evaluation

## 2018-10-29 NOTE — Anesthesia Postprocedure Evaluation (Signed)
Anesthesia Post Note  Patient: Glenn [name]  Procedure(s) Performed: CARDIOVERSION (N/A )     Patient location during evaluation: Endoscopy Anesthesia Type: General Level of consciousness: awake and alert Pain management: pain level controlled Vital Signs Assessment: post-procedure vital signs reviewed and stable Respiratory status: spontaneous breathing, nonlabored ventilation, respiratory function stable and patient connected to nasal cannula oxygen Cardiovascular status: blood pressure returned to baseline and stable Postop Assessment: no apparent nausea or vomiting Anesthetic complications: no    Last Vitals:  Vitals:   10/29/18 1050 10/29/18 1055  BP: 127/76 133/83  Pulse: 77 79  Resp: (!) 21 20  Temp:    SpO2: 95% 95%    Last Pain:  Vitals:   10/29/18 1055  TempSrc:   PainSc: 0-No pain                 Barnet Glasgow

## 2018-10-29 NOTE — ED Triage Notes (Signed)
PT presents to ED from home BIB [facility]. Pt c/o [facility] since cardioversion earlier today. Pt states he has been [facility] for a few days. Pt clearly resp distress, diaphoretic, labored breathing w/o exertion. EMS place pt on NRB and CPAP in route. No vitals or 12 lead from EMS.

## 2018-10-29 NOTE — Transfer of Care (Signed)
Immediate Anesthesia Transfer of Care Note  Patient: Glenn [name]  Procedure(s) Performed: CARDIOVERSION (N/A )  Patient Location: PACU and Endoscopy Unit  Anesthesia Type:General  Level of Consciousness: awake, drowsy and patient cooperative  Airway & Oxygen Therapy: Patient Spontanous Breathing and Patient connected to nasal cannula oxygen  Post-op Assessment: Report given to RN and Post -op Vital signs reviewed and stable  Post vital signs: Reviewed and stable  Last Vitals:  Vitals Value Taken Time  BP 89/60 10/29/18 1022  Temp 36.4 C 10/29/18 1016  Pulse 77 10/29/18 1020  Resp 21 10/29/18 1020  SpO2 100 % 10/29/18 1020    Last Pain:  Vitals:   10/29/18 1016  TempSrc: Temporal  PainSc:          Complications: No apparent anesthesia complications

## 2018-10-29 NOTE — ED Provider Notes (Signed)
[facility] EMERGENCY DEPARTMENT Provider Note   CSN: [id number] Arrival date & time: 10/29/18  2110     History   Chief Complaint Chief Complaint  Patient presents with  . Respiratory Distress    HPI Glenn [name] is a 68 y.o. male.     68 yo M with a chief complaints of shortness of breath.  Is been going on for a few days per him.  He actually was here earlier today and had an elective cardioversion for atrial fibrillation.  Patient is having significant increased work of breathing.  He required nonrebreather in route by EMS.  Got CPAP transiently with some improvement.  Level 5 caveat respiratory distress.  The history is provided by the patient and the EMS personnel.  Illness Severity:  Severe Onset quality:  Sudden Duration:  2 days Timing:  Constant Progression:  Worsening Chronicity:  New Associated symptoms: cough and shortness of breath   Associated symptoms: no abdominal pain, no chest pain, no congestion, no diarrhea, no fever, no headaches, no myalgias, no rash and no vomiting     Past Medical History:  Diagnosis Date  . Atrial fibrillation ([place])   . CHF (congestive heart failure) ([place])   . Hepatitis C   . Hypertension   . Stroke ([place])   . Visit for monitoring Tikosyn therapy 03/26/2017    Patient Active Problem List   Diagnosis Date Noted  . Entrapment of right ulnar nerve 02/28/2018  . Carpal tunnel syndrome of right wrist 02/28/2018  . Impotence due to erectile dysfunction 09/30/2017  . Solitary pulmonary nodule 06/10/2017  . Neck pain 04/06/2017  . Paroxysmal atrial fibrillation ([name])   . Visit for monitoring Tikosyn therapy 03/25/2017  . ICD (implantable cardioverter-defibrillator) in place 09/13/2016  . Chest pain 09/13/2016  . Tobacco abuse 09/13/2016  . Hospital discharge follow-up 09/13/2016  . Upper back pain 09/13/2016  . Housing problems 09/13/2016  . Other persistent atrial fibrillation   . Atrial  fibrillation with RVR ([place])   . Ischemic cardiomyopathy   . Cerebral infarction ([place])   . Stroke (cerebrum) ([place]) 09/05/2016  . CHF (congestive heart failure) ([place]) 08/14/2016  . HTN (hypertension) 08/14/2016  . Chronic Hepatitis C  08/14/2016    Past Surgical History:  Procedure Laterality Date  . CARDIAC DEFIBRILLATOR PLACEMENT  2015  . CARDIOVERSION N/A 10/10/2016   Procedure: CARDIOVERSION;  Surgeon: Dorothy Spark, MD;  Location: [facility] ENDOSCOPY;  Service: Cardiovascular;  Laterality: N/A;  . CARDIOVERSION N/A 03/27/2017   Procedure: CARDIOVERSION;  Surgeon: Jerline Pain, MD;  Location: [facility];  Service: Cardiovascular;  Laterality: N/A;  . EYE SURGERY Left 1990  . IR PERCUTANEOUS ART THROMBECTOMY/INFUSION INTRACRANIAL INC DIAG ANGIO  09/05/2016  . IR RADIOLOGIST EVAL & MGMT  10/03/2016  . RADIOLOGY WITH ANESTHESIA N/A 09/05/2016   Procedure: RADIOLOGY WITH ANESTHESIA;  Surgeon: Luanne Bras, MD;  Location: [facility];  Service: Radiology;  Laterality: N/A;        Home Medications    Prior to Admission medications   Medication Sig Start Date End Date Taking? Authorizing Provider  albuterol (VENTOLIN HFA) 108 (90 Base) MCG/ACT inhaler Inhale 2 puffs into the lungs every 4 (four) hours as needed for shortness of breath. 10/20/18   [provider]  carvedilol (COREG) 25 MG tablet TAKE 1 TABLET BY MOUTH TWICE DAILY WITH A MEAL Patient taking differently: Take 25 mg by mouth 2 (two) times daily with a meal.  09/25/18   Kris Mouton,  Edison Nasuti, MD  carvedilol (COREG) 25 MG tablet Take 1 tablet (25 mg total) by mouth 2 (two) times daily with a meal. 10/29/18   Croitoru, Mihai, MD  Cyanocobalamin (B-12) 500 MCG TABS Take 500 mcg by mouth daily as needed (energy). Patient taking differently: Take 500 mcg by mouth daily.  09/27/17   [name], MD  diazepam (VALIUM) 5 MG tablet TAKE 1/2 TABLET(2.5 MG) BY MOUTH EVERY 8 HOURS AS NEEDED FOR ANXIETY Patient taking differently: Take 5  mg by mouth 2 (two) times daily as needed for anxiety.  10/24/18   [name], MD  dofetilide (TIKOSYN) 500 MCG capsule TAKE 1 CAPSULE BY MOUTH TWICE A DAY Patient taking differently: Take 500 mcg by mouth 2 (two) times daily.  09/25/18   [name], MD  ELIQUIS 5 MG TABS tablet TAKE 1 TABLET BY MOUTH TWICE DAILY Patient taking differently: Take 5 mg by mouth 2 (two) times daily.  06/23/18   [name], MD  furosemide (LASIX) 40 MG tablet Take 1 tablet (40 mg total) by mouth daily. 10/15/18   Sherran Needs, NP  gabapentin (NEURONTIN) 300 MG capsule TAKE 1 CAPSULE(300 MG) BY MOUTH THREE TIMES DAILY Patient taking differently: Take 300 mg by mouth 3 (three) times daily.  10/10/18   [name], MD  Glecaprevir-Pibrentasvir (MAVYRET) 100-40 MG TABS Take 3 tablets by mouth daily with supper. Patient not taking: Reported on 10/24/2018 06/10/17   Prosperity Callas, NP  potassium chloride SA (K-DUR,KLOR-CON) 20 MEQ tablet Take 1 tablet (20 mEq total) by mouth daily. 09/27/17   [name], MD  vitamin C (ASCORBIC ACID) 500 MG tablet Take 500 mg by mouth daily.    [provider]    Family History Family History  Problem Relation Age of Onset  . Stroke Maternal Aunt   . Heart disease Neg Hx     Social History Social History   Tobacco Use  . Smoking status: Current Some Day Smoker    Packs/day: 0.50    Types: Cigarettes  . Smokeless tobacco: Never Used  . Tobacco comment: a pack last three days  Substance Use Topics  . Alcohol use: Not Currently    Alcohol/week: 3.0 standard drinks    Types: 3 Cans of beer per week    Comment: pt stop drinking   . Drug use: Yes    Frequency: 2.0 times per week    Types: Marijuana    Comment: stop smoking      Allergies   Benadryl [diphenhydramine]   Review of Systems Review of Systems  Constitutional: Negative for chills and fever.  HENT: Negative for congestion and facial swelling.   Eyes: Negative for discharge and  visual disturbance.  Respiratory: Positive for cough and shortness of breath.   Cardiovascular: Negative for chest pain and palpitations.  Gastrointestinal: Negative for abdominal pain, diarrhea and vomiting.  Musculoskeletal: Negative for arthralgias and myalgias.  Skin: Negative for color change and rash.  Neurological: Negative for tremors, syncope and headaches.  Psychiatric/Behavioral: Negative for confusion and dysphoric mood.     Physical Exam Updated Vital Signs BP (!) 148/85   Pulse (!) 110   Temp 98.8 F (37.1 C) (Axillary)   Resp (!) 31   SpO2 97%   Physical Exam Vitals signs and nursing note reviewed.  Constitutional:      Appearance: He is well-developed.  HENT:     Head: Normocephalic and atraumatic.  Eyes:     Pupils: Pupils are  equal, round, and reactive to light.  Neck:     Musculoskeletal: Normal range of motion and neck supple.     Vascular: No JVD.  Cardiovascular:     Rate and Rhythm: Normal rate and regular rhythm.     Heart sounds: No murmur. No friction rub. No gallop.   Pulmonary:     Effort: No respiratory distress.     Breath sounds: Wheezing present.     Comments: Tachypnea, diffuse wheezes. Abdominal:     General: There is no distension.     Tenderness: There is no guarding or rebound.  Musculoskeletal: Normal range of motion.     Right lower leg: Edema present.     Left lower leg: Edema present.     Comments: 2+ bilateral lower extremity edema  Skin:    Coloration: Skin is not pale.     Findings: No rash.  Neurological:     Mental Status: He is alert and oriented to person, place, and time.  Psychiatric:        Behavior: Behavior normal.      ED Treatments / Results  Labs (all labs ordered are listed, but only abnormal results are displayed) Labs Reviewed  CBC WITH DIFFERENTIAL/PLATELET - Abnormal; Notable for the following components:      Result Value   MCV 101.6 (*)    All other components within normal limits   COMPREHENSIVE METABOLIC PANEL - Abnormal; Notable for the following components:   Glucose, Bld 167 (*)    Creatinine, Ser 1.94 (*)    GFR calc non Af Amer 35 (*)    GFR calc Af Amer 41 (*)    All other components within normal limits  BRAIN NATRIURETIC PEPTIDE - Abnormal; Notable for the following components:   B Natriuretic Peptide 1,167.1 (*)    All other components within normal limits  CBG MONITORING, ED - Abnormal; Notable for the following components:   Glucose-Capillary 146 (*)    All other components within normal limits  POCT I-STAT EG7 - Abnormal; Notable for the following components:   pO2, Ven 67.0 (*)    Acid-base deficit 5.0 (*)    All other components within normal limits  TROPONIN I (HIGH SENSITIVITY) - Abnormal; Notable for the following components:   Troponin I (High Sensitivity) 45 (*)    All other components within normal limits  SARS CORONAVIRUS 2 (HOSPITAL ORDER, [facility])  CULTURE, BLOOD (ROUTINE X 2)  CULTURE, BLOOD (ROUTINE X 2)  LACTIC ACID, PLASMA  LACTIC ACID, PLASMA    EKG EKG Interpretation  Date/Time:  Wednesday October 29 2018 21:25:17 EDT Ventricular Rate:  122 PR Interval:    QRS Duration: 89 QT Interval:  343 QTC Calculation: 489 R Axis:   27 Text Interpretation:  Sinus tachycardia Atrial premature complex Probable anteroseptal infarct, old Nonspecific T abnormalities, lateral leads Since last tracing rate faster Confirmed by Deno Etienne [phone]) on 10/29/2018 10:13:10 PM   Radiology Dg Chest Port 1 View  Result Date: 10/29/2018 CLINICAL DATA:  Shortness of breath EXAM: PORTABLE CHEST 1 VIEW COMPARISON:  February 14, 2017 FINDINGS: There is cardiomegaly. A left-sided pacemaker seen with the lead tip in the right atrium. Mildly increased interstitial markings seen within the lower lungs. No large airspace consolidation. IMPRESSION: Mild cardiomegaly. Mildly increased interstitial markings which could be due to  atelectasis and/or mild interstitial edema. Electronically Signed   By: Prudencio Pair M.D.   On: 10/29/2018 21:47  Procedures Procedures (including critical care time)  Medications Ordered in ED Medications  Ipratropium-Albuterol (COMBIVENT) respimat 2 puff (2 puffs Inhalation Given 10/29/18 2238)  albuterol (VENTOLIN HFA) 108 (90 Base) MCG/ACT inhaler 8 puff (8 puffs Inhalation Refused 10/29/18 2240)  furosemide (LASIX) injection 40 mg (has no administration in time range)  magnesium sulfate IVPB 2 g 50 mL (2 g Intravenous New Bag/Given 10/29/18 2300)  methylPREDNISolone sodium succinate (SOLU-MEDROL) 125 mg/2 mL injection 125 mg (125 mg Intravenous Given 10/29/18 2302)     Initial Impression / Assessment and Plan / ED Course  I have reviewed the triage vital signs and the nursing notes.  Pertinent labs & imaging results that were available during my care of the patient were reviewed by me and considered in my medical decision making (see chart for details).        68 yo M with a chief complaints of shortness of breath.  Patient was cardioverted earlier today.  Heart rate appears to be in the 120s.  Sinus tachycardia.  Patient's presentation seems to be heart failure exacerbation versus COPD exacerbation.  Unfortunately due to the coronavirus pandemic there is some reluctance to start him on BiPAP.  Will attempt to give beta-2 agonist.  Magnesium Solu-Medrol.  Feeling better after breathing treatments.  HR improved, aeration improved.  CXR with pulmonary edema, though BP improved as well.  Given lasix.  Will admit.   EJ placement: 18 gauge IV placed in L EJ. Skin prepped with alcohol pads, L EJ identified with Valsalva. Cannulated with good return of dark, non-pulsatile blood. Tachyderm placed after easily flushed with NS.   CRITICAL CARE Performed by: Cecilio Asper   Total critical care time: 80 minutes  Critical care time was exclusive of separately billable procedures and  treating other patients.  Critical care was necessary to treat or prevent imminent or life-threatening deterioration.  Critical care was time spent personally by me on the following activities: development of treatment plan with patient and/or surrogate as well as nursing, discussions with consultants, evaluation of patient's response to treatment, examination of patient, obtaining history from patient or surrogate, ordering and performing treatments and interventions, ordering and review of laboratory studies, ordering and review of radiographic studies, pulse oximetry and re-evaluation of patient's condition.  The patients results and plan were reviewed and discussed.   Any x-rays performed were independently reviewed by myself.   Differential diagnosis were considered with the presenting HPI.  Medications  Ipratropium-Albuterol (COMBIVENT) respimat 2 puff (2 puffs Inhalation Given 10/29/18 2238)  albuterol (VENTOLIN HFA) 108 (90 Base) MCG/ACT inhaler 8 puff (8 puffs Inhalation Refused 10/29/18 2240)  furosemide (LASIX) injection 40 mg (has no administration in time range)  magnesium sulfate IVPB 2 g 50 mL (2 g Intravenous New Bag/Given 10/29/18 2300)  methylPREDNISolone sodium succinate (SOLU-MEDROL) 125 mg/2 mL injection 125 mg (125 mg Intravenous Given 10/29/18 2302)    Vitals:   10/29/18 2142 10/29/18 2143 10/29/18 2145 10/29/18 2200  BP:   (!) 161/96 (!) 148/85  Pulse:   (!) 113 (!) 110  Resp:   (!) 24 (!) 31  Temp:  98.8 F (37.1 C)    TempSrc:  Axillary    SpO2: 100%  100% 97%    Final diagnoses:  Acute respiratory failure with hypoxia ([name])  COPD exacerbation ([name])    Admission/ observation were discussed with the admitting physician, patient and/or family and they are comfortable with the plan.    Final Clinical Impressions(s) / ED Diagnoses  Final diagnoses:  Acute respiratory failure with hypoxia [facility])  COPD exacerbation [facility])    ED Discharge Orders    None        Deno Etienne, DO 10/29/18 2323

## 2018-10-29 NOTE — ED Notes (Signed)
EKG given to Dr. Tyrone Nine and called chest xray for a portable.

## 2018-10-29 NOTE — H&P (Addendum)
[facility] Admission History and Physical Service Pager: [phone]  Patient name: Glenn Glenn [name] Medical record number: [id number] Date of birth: 1950-07-25 Age: 68 y.o. Gender: male  Primary Care Provider: [name], MD Consultants: cardiology Code Status: Full Preferred Emergency Contact: [name], son: [phone]  Chief Complaint: SOB   Assessment and Plan: [name] is a 68 y.o. male presenting with respiratory distress. PMH is significant for AFIB, NICM with ICD, HTN, left MCA CVA, Hep C, hx of cocaine and tobacco abuse    Acute respiratory distress  [name] with acute worsening shortness of breath today after getting home from cardioversion for his persistent AFIB.  Reports progressively worsening shortness of breath for the past 3 to 4 days. Came into the ED via EMS on a nonrebreather and was weaned to 5 L nasal cannula.  On exam, patient tachypneic with clear lungs clear however there was decreased air movement in the lung bases. Satting 97% on 5 L nasal cannula. Chest x-ray indicated mild cardiomegaly with mildly increased interstitial markings, BNP was 1167.  Patient received IV Lasix 40 mg x1.  Respiratory distress likely due to AFIB and/or acute exacerbation of HFrEF. EKG indicated sinus tachycardia.  Patient with 1+ pitting lower extremity edema on exam.  Will repeat echo as last EF was 30 to 35% in 2018 and patient has had AICD placement since, monitor patient's fluid status and repeat EKG. Could also be due to MI, but given EKG findings and mild elevation of high-sensitivity troponin was elevated to 45>55 with patient denying chest pain. Patient  with history of IV drug use in the past cannot rule out ACS due to substance abuse, though it is least likely.  Also patient on diazepam 5 mg, 45 pills every month but reports taking medication appropriately, so less likely cause.  Less likely PE given well score of 1.5. -Admit to  telemetry, attending Dr. Owens Shark -Consult cardiology in a.m. -Repeat a.m. EKG -Continue home medications as below -Continue IV diuresis with repeat 40 mg IV Lasix -Monitor strict I's and O's, daily weights -Vitals per routine -Wean oxygen as able -Trend troponins  HFrEF EF 30-35%  NICM s/p ICD Echo on 09/06/2016 indicated EF 30 to 35% with diffuse hypokinesis, left atrium moderately dilated, right atrium moderately dilated with mild to moderate regurgitation of the tricuspid valve.  Patient has ICD battery checked 07/07/2018.  Per cardiology AFIB likely contributing to heart failure.  Patient volume overloaded on exam with 1+ pitting edema to the calf.  Regimen includes 40 mg Lasix daily, Coreg 25 mg twice daily, potassium 20 mEq daily.  Patient's dry weight is 205 lb (93 kg).  At his cardioversion patient weighed 213 lbs (97.1 kg).  We will continue to monitor fluid status -Continue IV diuresis with repeat 40 mg IV Lasix - Repeat ECHO  - Strict Is/Os, Daily weights  - Replete electrolytes as needed, daily BMP, magnesium level in a.m. - vitals per routine -Consult cardiology  AFIB   Diagnosed in November 2017 with atrial fibrillation.  Patient missed a few doses of Tikosyn and had persistent AFIB for couple weeks and was electrical cardioverted this morning by Dr. [name].  Patient's cardiologist, Dr. [name] fib clinic, reported atrial fibrillation was likely contributing to his heart failure.  He previously was successfully cardioverted in August 2018.  Home regimen includes 120 mg of Cardizem daily, Eliquis 5 mg twice daily, Tikosyn 500 mill grams twice daily.  On EKG patient is sinus  tachycardia.  -Continue home Eliquis 5 mg twice daily, Tikosyn 500 mg twice daily, carvedilol 25 mg twice daily -Per cardiology notes patient also taking Cardizem 120 mg daily, however this is not in his med rec.  We will continue Cardizem per last cardiology note given that patient is back in A. fib this  morning -Consult patient's cardiologist regarding acute respiratory failure following cardioversion  -Repeat EKG  Acute kidney injury Creatinine on admission was 1.94  Previous creatinine on 1.1 -1.2 at baseline.  Likely cardiorenal and related to patient's current fluid overload, anticipate improvement with diuresis. - Avoid nephrotoxic agents - daily BMPs  - diuresis as above  Hypertension Blood pressures since admission have ranged between 174/107 - 62/41 most recently 148/85.    Patient denies taking any medicine for hypertension, but is on Coreg 25 mg twice daily.  Will order lipid panel.   -Continue home medicinications -Monitor blood pressures  Tobacco use disorder Patient endorses smoking 0.5 packs/day for the past 30-40 years.  -Encourage smoking cessation -Nicotine patch if needed  H/o CVA left MCA Stable. patient had a stroke on 09/05/2016. No residual deficits.  Patient underwent chemical thrombectomy for left M2 occlusion.  Appears the patient previously was on aspirin 81 mg daily, however not recently on this. Unclear patient should be on this.  H/o cocaine use disorder Patient with history of cocaine use .unsure if this is contributing to his acute onset shortness of breath and heart failure.  Will obtain UDS.  -UDS   Chronic Hep C s/p treatment Patient diagnosed in March 2019 likely due to history of 5 urine drug use in the 1980s.  There is no personal or family history of liver disease.  Patient received treatment for hep C in April 2019.   FEN/GI: heart healthy diet Prophylaxis: Home dose Eliquis  Disposition: likely home pending medical clearance   History of Present Illness:  Glenn [name] Glenn [name] is a 68 y.o. male presenting with SOB. He says had cardioversion earlier today because his A fib was acting up. He states after wards, he got home and felt very short of breath. He says he felt like he was unable to get in oxygen. He has never really felt like this before.  He was short of breath for 3-4 days, and just got much worse after the cardioversion, and he thought that would help him to get his breathing back right, but it got much worse. Patient reports increased leg swelling and having to use 2-3 pillows at night, unable to lay flat. Shortness of breath was worse with exertion. He has an inhaler at home but they made him feel worse so he did not use them, said he tried to use the albuterol 2-3 times. He reports that he has also had a productive cough for 2-3 days. Phlegm has been brownish. Does not think he has had any fevers. He says that he has felt dizzy and lightheaded.   Patient arrived to the ED via EMS on nonrebreather.  He was in respiratory distress diuretic and had labored breathing.  He was weaned to 6 L of oxygen via nasal cannula.  Chest x-ray indicated mild cardiomegaly with mildly increased interstitial markings, BNP was 1167. patient with creatinine of 1.94.  High-sensitivity troponin was 45.  Patient will be admitted for medical stabilization.  We will consult cardiology in the morning.   Review Of Systems: Per HPI with the following additions:   Review of Systems  Constitutional: Positive for chills. Negative  for fever.  HENT: Negative for congestion and sore throat.   Respiratory: Positive for cough, sputum production and shortness of breath.   Cardiovascular: Positive for leg swelling. Negative for chest pain.  Gastrointestinal: Negative for abdominal pain, constipation, diarrhea, nausea and vomiting.  Genitourinary: Negative for dysuria.  Musculoskeletal: Negative for back pain, falls, joint pain and neck pain.  Skin: Negative for rash.  Neurological: Positive for dizziness and headaches. Negative for focal weakness, loss of consciousness and weakness.  Psychiatric/Behavioral: Substance abuse: hx of cocaine.    Patient Active Problem List   Diagnosis Date Noted  . CHF exacerbation ([place]) 10/30/2018  . Entrapment of right ulnar nerve  02/28/2018  . Carpal tunnel syndrome of right wrist 02/28/2018  . Impotence due to erectile dysfunction 09/30/2017  . Solitary pulmonary nodule 06/10/2017  . Neck pain 04/06/2017  . Paroxysmal atrial fibrillation ([name])   . Visit for monitoring Tikosyn therapy 03/25/2017  . ICD (implantable cardioverter-defibrillator) in place 09/13/2016  . Chest pain 09/13/2016  . Tobacco abuse 09/13/2016  . Hospital discharge follow-up 09/13/2016  . Upper back pain 09/13/2016  . Housing problems 09/13/2016  . Other persistent atrial fibrillation   . Atrial fibrillation with RVR ([place])   . Ischemic cardiomyopathy   . Cerebral infarction ([place])   . Stroke (cerebrum) ([place]) 09/05/2016  . CHF (congestive heart failure) ([place]) 08/14/2016  . HTN (hypertension) 08/14/2016  . Chronic Hepatitis C  08/14/2016    Past Medical History: Past Medical History:  Diagnosis Date  . Atrial fibrillation ([place])   . CHF (congestive heart failure) ([place])   . Hepatitis C   . Hypertension   . Stroke ([place])   . Visit for monitoring Tikosyn therapy 03/26/2017    Past Surgical History: Past Surgical History:  Procedure Laterality Date  . CARDIAC DEFIBRILLATOR PLACEMENT  2015  . CARDIOVERSION N/A 10/10/2016   Procedure: CARDIOVERSION;  Surgeon: Dorothy Spark, MD;  Location: [facility] ENDOSCOPY;  Service: Cardiovascular;  Laterality: N/A;  . CARDIOVERSION N/A 03/27/2017   Procedure: CARDIOVERSION;  Surgeon: Jerline Pain, MD;  Location: [facility];  Service: Cardiovascular;  Laterality: N/A;  . EYE SURGERY Left 1990  . IR PERCUTANEOUS ART THROMBECTOMY/INFUSION INTRACRANIAL INC DIAG ANGIO  09/05/2016  . IR RADIOLOGIST EVAL & MGMT  10/03/2016  . RADIOLOGY WITH ANESTHESIA N/A 09/05/2016   Procedure: RADIOLOGY WITH ANESTHESIA;  Surgeon: Luanne Bras, MD;  Location: [facility];  Service: Radiology;  Laterality: N/A;    Social History: Social History   Tobacco Use  . Smoking status: Current Some Day Smoker    Packs/day: 0.50     Types: Cigarettes  . Smokeless tobacco: Never Used  . Tobacco comment: a pack last three days  Substance Use Topics  . Alcohol use: Not Currently    Alcohol/week: 3.0 standard drinks    Types: 3 Cans of beer per week    Comment: pt stop drinking   . Drug use: Yes    Frequency: 2.0 times per week    Types: Marijuana    Comment: stop smoking    Additional social history: 0.5 ppd, reports previously using cocaine, but has not used in years, reports last alcoholic drink was 6 months ago Please also refer to relevant sections of EMR.  Family History: Family History  Problem Relation Age of Onset  . Stroke Maternal Aunt   . Heart disease Neg Hx     Allergies and Medications: Allergies  Allergen Reactions  . Benadryl [Diphenhydramine] Palpitations   No current  facility-administered medications on file prior to encounter.    Current Outpatient Medications on File Prior to Encounter  Medication Sig Dispense Refill  . albuterol (VENTOLIN HFA) 108 (90 Base) MCG/ACT inhaler Inhale 2 puffs into the lungs every 4 (four) hours as needed for shortness of breath.    . carvedilol (COREG) 25 MG tablet TAKE 1 TABLET BY MOUTH TWICE DAILY WITH A MEAL (Patient taking differently: Take 25 mg by mouth 2 (two) times daily with a meal. ) 60 tablet 6  . carvedilol (COREG) 25 MG tablet Take 1 tablet (25 mg total) by mouth 2 (two) times daily with a meal. 180 tablet 3  . Cyanocobalamin (B-12) 500 MCG TABS Take 500 mcg by mouth daily as needed (energy). (Patient taking differently: Take 500 mcg by mouth daily. ) 30 tablet 2  . diazepam (VALIUM) 5 MG tablet TAKE 1/2 TABLET(2.5 MG) BY MOUTH EVERY 8 HOURS AS NEEDED FOR ANXIETY (Patient taking differently: Take 5 mg by mouth 2 (two) times daily as needed for anxiety. ) 45 tablet 0  . dofetilide (TIKOSYN) 500 MCG capsule TAKE 1 CAPSULE BY MOUTH TWICE A DAY (Patient taking differently: Take 500 mcg by mouth 2 (two) times daily. ) 180 capsule 0  . ELIQUIS 5 MG  TABS tablet TAKE 1 TABLET BY MOUTH TWICE DAILY (Patient taking differently: Take 5 mg by mouth 2 (two) times daily. ) 60 tablet 5  . furosemide (LASIX) 40 MG tablet Take 1 tablet (40 mg total) by mouth daily. 90 tablet 1  . gabapentin (NEURONTIN) 300 MG capsule TAKE 1 CAPSULE(300 MG) BY MOUTH THREE TIMES DAILY (Patient taking differently: Take 300 mg by mouth 3 (three) times daily. ) 180 capsule 0  . Glecaprevir-Pibrentasvir (MAVYRET) 100-40 MG TABS Take 3 tablets by mouth daily with supper. (Patient not taking: Reported on 10/24/2018) 84 tablet 1  . potassium chloride SA (K-DUR,KLOR-CON) 20 MEQ tablet Take 1 tablet (20 mEq total) by mouth daily. 90 tablet 3  . vitamin C (ASCORBIC ACID) 500 MG tablet Take 500 mg by mouth daily.      Objective: BP (!) 148/85   Pulse (!) 110   Temp 98.8 F (37.1 C) (Axillary)   Resp (!) 31   SpO2 97%    Exam: GEN:     alert, comfortable wearing nasal cannula   HENT:  mucus membranes moist, oropharyngeal without lesions or erythema,  nares patent, no nasal discharge  EYES:   pupils equal and reactive, EOM intact NECK:  normal ROM, no lymphadenopathy, central line left jugular  RESP:  clear to auscultation bilaterally, decreased air movement, tachypnea CVS:   tachycardic, regular rhythm, no murmur, distal pulses intact   ABD:  soft, non-tender; bowel sounds present; no palpable masses EXT:   normal ROM, atraumatic, bilateral 1+ pitting edema to calf NEURO:  normal without focal findings,  speech normal, alert and oriented   Skin:   warm and dry Psych: Normal affect and thought content   Labs and Imaging: CBC BMET  Recent Labs  Lab 10/30/18 0338  WBC 6.3  HGB 12.2*  HCT 37.3*  PLT 179   Recent Labs  Lab 10/30/18 0338  NA 139  K 4.4  CL 105  CO2 24  BUN 19  CREATININE 1.53*  GLUCOSE 134*  CALCIUM 8.9     EKG: HR 122, sinus tachycardia     Lyndee Hensen, DO 10/30/2018, 12:43 AM PGY-1, [facility] Intern pager:  [phone], text pages welcome  FPTS Upper-Level Resident Addendum   I have independently interviewed and examined the patient. I have discussed the above with the original author and agree with their documentation. My edits for correction/addition/clarification are in purple. Please see also any attending notes.    Martinique Termaine Roupp, DO PGY-3, [facility] 10/30/2018 7:58 AM  FPTS Service pager: [phone] (text pages welcome through [facility])

## 2018-10-29 NOTE — Op Note (Addendum)
Procedure: Electrical Cardioversion Indications:  Atrial Fibrillation  Procedure Details:  Consent: Risks of procedure as well as the alternatives and risks of each were explained to the (patient/caregiver).  Consent for procedure obtained.  Time Out: Verified patient identification, verified procedure, site/side was marked, verified correct patient position, special equipment/implants available, medications/allergies/relevent history reviewed, required imaging and test results available.  Performed  Patient placed on cardiac monitor, pulse oximetry, supplemental oxygen as necessary.  Sedation given: Propofol 120 mg IV, Dr. Valma Cava Pacer pads placed anterior and posterior chest.  Cardioverted 1 time(s).  Cardioversion with synchronized biphasic 150J shock.  Evaluation: Findings: Post procedure EKG shows: NSR Complications: transient hypotension requiring IV ephedrine and phenylephrine Patient did tolerate procedure well.  Post cardioversion normal ICD check, lead parameters unchanged.  Time Spent Directly with the Patient:  45 minutes   Marrio Scribner 10/29/2018, 10:25 AM

## 2018-10-29 NOTE — Interval H&P Note (Signed)
History and Physical Interval Note:  10/29/2018 9:22 AM  Glenn [name]  has presented today for surgery, with the diagnosis of AFIB.  The various methods of treatment have been discussed with the patient and family. After consideration of risks, benefits and other options for treatment, the patient has consented to  Procedure(s): CARDIOVERSION (N/A) as a surgical intervention.  The patient's history has been reviewed, patient examined, no change in status, stable for surgery.  I have reviewed the patient's chart and labs.  Questions were answered to the patient's satisfaction.     Tnia Anglada

## 2018-10-30 ENCOUNTER — Encounter (HOSPITAL_COMMUNITY): Payer: Self-pay | Admitting: Medical

## 2018-10-30 ENCOUNTER — Inpatient Hospital Stay (HOSPITAL_COMMUNITY): Payer: Medicare HMO

## 2018-10-30 ENCOUNTER — Encounter: Payer: Medicare HMO | Admitting: *Deleted

## 2018-10-30 DIAGNOSIS — R9431 Abnormal electrocardiogram [ECG] [EKG]: Secondary | ICD-10-CM | POA: Diagnosis not present

## 2018-10-30 DIAGNOSIS — I5021 Acute systolic (congestive) heart failure: Secondary | ICD-10-CM

## 2018-10-30 DIAGNOSIS — Z888 Allergy status to other drugs, medicaments and biological substances status: Secondary | ICD-10-CM | POA: Diagnosis not present

## 2018-10-30 DIAGNOSIS — I420 Dilated cardiomyopathy: Secondary | ICD-10-CM | POA: Diagnosis not present

## 2018-10-30 DIAGNOSIS — I10 Essential (primary) hypertension: Secondary | ICD-10-CM | POA: Diagnosis not present

## 2018-10-30 DIAGNOSIS — Z79899 Other long term (current) drug therapy: Secondary | ICD-10-CM | POA: Diagnosis not present

## 2018-10-30 DIAGNOSIS — I472 Ventricular tachycardia: Secondary | ICD-10-CM | POA: Diagnosis present

## 2018-10-30 DIAGNOSIS — I361 Nonrheumatic tricuspid (valve) insufficiency: Secondary | ICD-10-CM

## 2018-10-30 DIAGNOSIS — Z720 Tobacco use: Secondary | ICD-10-CM

## 2018-10-30 DIAGNOSIS — J441 Chronic obstructive pulmonary disease with (acute) exacerbation: Secondary | ICD-10-CM | POA: Diagnosis present

## 2018-10-30 DIAGNOSIS — I509 Heart failure, unspecified: Secondary | ICD-10-CM

## 2018-10-30 DIAGNOSIS — J9601 Acute respiratory failure with hypoxia: Secondary | ICD-10-CM | POA: Diagnosis present

## 2018-10-30 DIAGNOSIS — N183 Chronic kidney disease, stage 3 (moderate): Secondary | ICD-10-CM | POA: Diagnosis present

## 2018-10-30 DIAGNOSIS — I4891 Unspecified atrial fibrillation: Secondary | ICD-10-CM | POA: Diagnosis not present

## 2018-10-30 DIAGNOSIS — R7989 Other specified abnormal findings of blood chemistry: Secondary | ICD-10-CM | POA: Diagnosis not present

## 2018-10-30 DIAGNOSIS — N179 Acute kidney failure, unspecified: Secondary | ICD-10-CM | POA: Diagnosis present

## 2018-10-30 DIAGNOSIS — B182 Chronic viral hepatitis C: Secondary | ICD-10-CM | POA: Diagnosis present

## 2018-10-30 DIAGNOSIS — I340 Nonrheumatic mitral (valve) insufficiency: Secondary | ICD-10-CM

## 2018-10-30 DIAGNOSIS — Z9119 Patient's noncompliance with other medical treatment and regimen: Secondary | ICD-10-CM | POA: Diagnosis not present

## 2018-10-30 DIAGNOSIS — I5023 Acute on chronic systolic (congestive) heart failure: Secondary | ICD-10-CM | POA: Diagnosis present

## 2018-10-30 DIAGNOSIS — I4819 Other persistent atrial fibrillation: Secondary | ICD-10-CM | POA: Diagnosis present

## 2018-10-30 DIAGNOSIS — I4811 Longstanding persistent atrial fibrillation: Secondary | ICD-10-CM | POA: Diagnosis not present

## 2018-10-30 DIAGNOSIS — Z20828 Contact with and (suspected) exposure to other viral communicable diseases: Secondary | ICD-10-CM | POA: Diagnosis present

## 2018-10-30 DIAGNOSIS — I130 Hypertensive heart and chronic kidney disease with heart failure and stage 1 through stage 4 chronic kidney disease, or unspecified chronic kidney disease: Secondary | ICD-10-CM | POA: Diagnosis present

## 2018-10-30 DIAGNOSIS — R031 Nonspecific low blood-pressure reading: Secondary | ICD-10-CM | POA: Diagnosis present

## 2018-10-30 DIAGNOSIS — Z8673 Personal history of transient ischemic attack (TIA), and cerebral infarction without residual deficits: Secondary | ICD-10-CM | POA: Diagnosis not present

## 2018-10-30 DIAGNOSIS — Z7901 Long term (current) use of anticoagulants: Secondary | ICD-10-CM | POA: Diagnosis not present

## 2018-10-30 DIAGNOSIS — I2510 Atherosclerotic heart disease of native coronary artery without angina pectoris: Secondary | ICD-10-CM | POA: Diagnosis present

## 2018-10-30 DIAGNOSIS — R51 Headache: Secondary | ICD-10-CM | POA: Diagnosis not present

## 2018-10-30 DIAGNOSIS — F1490 Cocaine use, unspecified, uncomplicated: Secondary | ICD-10-CM | POA: Diagnosis present

## 2018-10-30 DIAGNOSIS — Z95810 Presence of automatic (implantable) cardiac defibrillator: Secondary | ICD-10-CM | POA: Diagnosis not present

## 2018-10-30 DIAGNOSIS — F17210 Nicotine dependence, cigarettes, uncomplicated: Secondary | ICD-10-CM | POA: Diagnosis present

## 2018-10-30 DIAGNOSIS — I428 Other cardiomyopathies: Secondary | ICD-10-CM | POA: Diagnosis present

## 2018-10-30 DIAGNOSIS — E785 Hyperlipidemia, unspecified: Secondary | ICD-10-CM | POA: Diagnosis present

## 2018-10-30 DIAGNOSIS — K5900 Constipation, unspecified: Secondary | ICD-10-CM | POA: Diagnosis present

## 2018-10-30 DIAGNOSIS — I480 Paroxysmal atrial fibrillation: Secondary | ICD-10-CM | POA: Diagnosis not present

## 2018-10-30 LAB — TROPONIN I (HIGH SENSITIVITY)
Troponin I (High Sensitivity): 55 ng/L — ABNORMAL HIGH (ref <18)
Troponin I (High Sensitivity): 58 ng/L — ABNORMAL HIGH (ref <18)
Troponin I (High Sensitivity): 52 ng/L — ABNORMAL HIGH (ref <18)
Troponin I (High Sensitivity): 31 ng/L — ABNORMAL HIGH (ref <18)
Troponin I (High Sensitivity): 32 ng/L — ABNORMAL HIGH (ref <18)

## 2018-10-30 LAB — LACTIC ACID, PLASMA: Lactic Acid, Venous: 2 mmol/L (ref 0.5–1.9)

## 2018-10-30 LAB — BASIC METABOLIC PANEL
Anion gap: 10 (ref 5–15)
BUN: 19 mg/dL (ref 8–23)
CO2: 24 mmol/L (ref 22–32)
Calcium: 8.9 mg/dL (ref 8.9–10.3)
Chloride: 105 mmol/L (ref 98–111)
Creatinine, Ser: 1.53 mg/dL — ABNORMAL HIGH (ref 0.61–1.24)
GFR calc Af Amer: 54 mL/min — ABNORMAL LOW (ref >60)
GFR calc non Af Amer: 47 mL/min — ABNORMAL LOW (ref >60)
Glucose, Bld: 134 mg/dL — ABNORMAL HIGH (ref 70–99)
Potassium: 4.4 mmol/L (ref 3.5–5.1)
Sodium: 139 mmol/L (ref 135–145)

## 2018-10-30 LAB — RAPID URINE DRUG SCREEN, HOSP PERFORMED
Amphetamines: NOT DETECTED
Barbiturates: NOT DETECTED
Benzodiazepines: POSITIVE — AB
Cocaine: NOT DETECTED
Opiates: NOT DETECTED
Tetrahydrocannabinol: NOT DETECTED

## 2018-10-30 LAB — CBC
HCT: 37.3 % — ABNORMAL LOW (ref 39.0–52.0)
Hemoglobin: 12.2 g/dL — ABNORMAL LOW (ref 13.0–17.0)
MCH: 31.8 pg (ref 26.0–34.0)
MCHC: 32.7 g/dL (ref 30.0–36.0)
MCV: 97.1 fL (ref 80.0–100.0)
Platelets: 179 10*3/uL (ref 150–400)
RBC: 3.84 MIL/uL — ABNORMAL LOW (ref 4.22–5.81)
RDW: 13.5 % (ref 11.5–15.5)
WBC: 6.3 10*3/uL (ref 4.0–10.5)
nRBC: 0 % (ref 0.0–0.2)

## 2018-10-30 LAB — LIPID PANEL
Cholesterol: 110 mg/dL (ref 0–200)
HDL: 26 mg/dL — ABNORMAL LOW (ref >40)
LDL Cholesterol: 74 mg/dL (ref 0–99)
Total CHOL/HDL Ratio: 4.2 RATIO
Triglycerides: 48 mg/dL (ref <150)
VLDL: 10 mg/dL (ref 0–40)

## 2018-10-30 LAB — APTT: aPTT: 35 seconds (ref 24–36)

## 2018-10-30 LAB — ECHOCARDIOGRAM COMPLETE
Height: 67 in
Weight: 3424 oz

## 2018-10-30 LAB — SARS CORONAVIRUS 2 BY RT PCR (HOSPITAL ORDER, PERFORMED IN ~~LOC~~ HOSPITAL LAB): SARS Coronavirus 2: NEGATIVE

## 2018-10-30 LAB — HEPARIN LEVEL (UNFRACTIONATED): Heparin Unfractionated: >2.2 IU/mL — ABNORMAL HIGH (ref 0.30–0.70)

## 2018-10-30 LAB — MAGNESIUM: Magnesium: 2.2 mg/dL (ref 1.7–2.4)

## 2018-10-30 MED ORDER — TRAMADOL HCL 50 MG PO TABS
50.0000 mg | ORAL_TABLET | Freq: Once | ORAL | Status: AC | PRN
  Administered 2018-10-30: 50 mg via ORAL
  Filled 2018-10-30: qty 1

## 2018-10-30 MED ORDER — POTASSIUM CHLORIDE CRYS ER 20 MEQ PO TBCR
20.0000 meq | EXTENDED_RELEASE_TABLET | Freq: Every day | ORAL | Status: DC
  Administered 2018-10-30 – 2018-11-02 (×4): 20 meq via ORAL
  Filled 2018-10-30 (×4): qty 1

## 2018-10-30 MED ORDER — DILTIAZEM HCL ER COATED BEADS 120 MG PO CP24
120.0000 mg | ORAL_CAPSULE | Freq: Every day | ORAL | Status: DC
  Administered 2018-10-30 – 2018-10-31 (×2): 120 mg via ORAL
  Filled 2018-10-30 (×3): qty 1

## 2018-10-30 MED ORDER — IPRATROPIUM-ALBUTEROL 0.5-2.5 (3) MG/3ML IN SOLN
3.0000 mL | Freq: Four times a day (QID) | RESPIRATORY_TRACT | Status: DC
  Administered 2018-10-30: 3 mL via RESPIRATORY_TRACT
  Filled 2018-10-30: qty 3

## 2018-10-30 MED ORDER — DOFETILIDE 500 MCG PO CAPS
500.0000 ug | ORAL_CAPSULE | Freq: Two times a day (BID) | ORAL | Status: DC
  Administered 2018-10-30: 500 ug via ORAL
  Filled 2018-10-30 (×2): qty 1

## 2018-10-30 MED ORDER — IPRATROPIUM-ALBUTEROL 0.5-2.5 (3) MG/3ML IN SOLN
3.0000 mL | Freq: Three times a day (TID) | RESPIRATORY_TRACT | Status: DC
  Administered 2018-10-31: 3 mL via RESPIRATORY_TRACT
  Filled 2018-10-30 (×2): qty 3

## 2018-10-30 MED ORDER — FUROSEMIDE 40 MG PO TABS
40.0000 mg | ORAL_TABLET | Freq: Once | ORAL | Status: AC
  Administered 2018-10-30: 40 mg via ORAL
  Filled 2018-10-30: qty 1

## 2018-10-30 MED ORDER — HEPARIN (PORCINE) 25000 UT/250ML-% IV SOLN
2400.0000 [IU]/h | INTRAVENOUS | Status: DC
  Administered 2018-10-30 (×2): 1100 [IU]/h via INTRAVENOUS
  Administered 2018-10-31: 1800 [IU]/h via INTRAVENOUS
  Administered 2018-11-01: 2100 [IU]/h via INTRAVENOUS
  Administered 2018-11-01: 2400 [IU]/h via INTRAVENOUS
  Filled 2018-10-30 (×3): qty 250

## 2018-10-30 MED ORDER — PERFLUTREN LIPID MICROSPHERE
1.0000 mL | INTRAVENOUS | Status: AC | PRN
  Administered 2018-10-30: 5 mL via INTRAVENOUS
  Filled 2018-10-30: qty 10

## 2018-10-30 MED ORDER — CARVEDILOL 25 MG PO TABS
25.0000 mg | ORAL_TABLET | Freq: Two times a day (BID) | ORAL | Status: DC
  Administered 2018-10-30 – 2018-11-06 (×15): 25 mg via ORAL
  Filled 2018-10-30 (×11): qty 1
  Filled 2018-10-30: qty 2
  Filled 2018-10-30 (×3): qty 1

## 2018-10-30 MED ORDER — FUROSEMIDE 10 MG/ML IJ SOLN
40.0000 mg | Freq: Once | INTRAMUSCULAR | Status: AC
  Administered 2018-10-30: 40 mg via INTRAVENOUS
  Filled 2018-10-30: qty 4

## 2018-10-30 MED ORDER — DIAZEPAM 5 MG PO TABS
5.0000 mg | ORAL_TABLET | Freq: Every day | ORAL | Status: DC | PRN
  Administered 2018-11-02 – 2018-11-05 (×4): 5 mg via ORAL
  Filled 2018-10-30 (×4): qty 1

## 2018-10-30 MED ORDER — VITAMIN B-12 1000 MCG PO TABS
500.0000 ug | ORAL_TABLET | Freq: Every day | ORAL | Status: DC
  Administered 2018-10-30 – 2018-11-06 (×8): 500 ug via ORAL
  Filled 2018-10-30 (×8): qty 1

## 2018-10-30 MED ORDER — ACETAMINOPHEN 325 MG PO TABS
650.0000 mg | ORAL_TABLET | Freq: Four times a day (QID) | ORAL | Status: DC | PRN
  Administered 2018-10-30 – 2018-10-31 (×2): 650 mg via ORAL
  Filled 2018-10-30 (×2): qty 2

## 2018-10-30 MED ORDER — VITAMIN C 500 MG PO TABS
500.0000 mg | ORAL_TABLET | Freq: Every day | ORAL | Status: DC
  Administered 2018-10-30 – 2018-11-06 (×8): 500 mg via ORAL
  Filled 2018-10-30 (×8): qty 1

## 2018-10-30 MED ORDER — ALBUTEROL SULFATE (2.5 MG/3ML) 0.083% IN NEBU: 2.5000 mg | INHALATION_SOLUTION | RESPIRATORY_TRACT | Status: DC | PRN

## 2018-10-30 MED ORDER — APIXABAN 5 MG PO TABS
5.0000 mg | ORAL_TABLET | Freq: Two times a day (BID) | ORAL | Status: DC
  Administered 2018-10-30: 5 mg via ORAL
  Filled 2018-10-30 (×2): qty 1

## 2018-10-30 MED ORDER — IPRATROPIUM-ALBUTEROL 20-100 MCG/ACT IN AERS: 2.0000 | INHALATION_SPRAY | Freq: Four times a day (QID) | RESPIRATORY_TRACT | Status: DC

## 2018-10-30 MED ORDER — ACETAMINOPHEN 650 MG RE SUPP: 650.0000 mg | Freq: Four times a day (QID) | RECTAL | Status: DC | PRN

## 2018-10-30 MED ORDER — ALBUTEROL SULFATE HFA 108 (90 BASE) MCG/ACT IN AERS
2.0000 | INHALATION_SPRAY | RESPIRATORY_TRACT | Status: DC | PRN
  Filled 2018-10-30: qty 6.7

## 2018-10-30 NOTE — ED Notes (Signed)
Tele   Breakfast ordered  

## 2018-10-30 NOTE — ED Notes (Signed)
RN walked into room pt has removed [place] and is on RA. Pt O2 sat remaining above 95%

## 2018-10-30 NOTE — Progress Notes (Signed)
  Echocardiogram 2D Echocardiogram with definity has been performed.  Glenn [name] M 10/30/2018, 9:55 AM

## 2018-10-30 NOTE — ED Notes (Signed)
Echo at bedside

## 2018-10-30 NOTE — ED Notes (Signed)
Dr. Shan Levans at bedside and is aware of GTc of 510  And HR 140's-150's ; hold Tikosyn for right now and adminster cardizem PO ,

## 2018-10-30 NOTE — Progress Notes (Signed)
RT responded to reassess pt. Pt. Currently resting with no distress. Bipap not indicated at this time. RN is aware and will continue to monitor pt.

## 2018-10-30 NOTE — Progress Notes (Signed)
ANTICOAGULATION CONSULT NOTE - Initial Consult  Pharmacy Consult for Apixaban >> Heparin Indication: atrial fibrillation, new concern for ACS  Allergies  Allergen Reactions  . Benadryl [Diphenhydramine] Palpitations    Patient Measurements: Height: 5\' 7"  (170.2 cm) Weight: 202 lb 1.6 oz (91.7 kg) IBW/kg (Calculated) : 66.1 Heparin Dosing Weight: 85.3 kg  Vital Signs: Temp: 97.8 F (36.6 C) (09/10 1615) Temp Source: Oral (09/10 1615) BP: 137/92 (09/10 1615) Pulse Rate: 80 (09/10 1615)  Labs: Recent Labs    10/29/18 0939  10/29/18 2155 10/29/18 2303 10/29/18 2355 10/30/18 0127 10/30/18 0338 10/30/18 0339  HGB 13.6  --  14.2 14.3  --   --  12.2*  --   HCT 40.0  --  44.8 42.0  --   --  37.3*  --   PLT  --   --  222  --   --   --  179  --   CREATININE 1.20  --  1.94*  --   --   --  1.53*  --   TROPONINIHS  --    < > 45*  --  55* 58*  --  52*   < > = values in this interval not displayed.    Estimated Creatinine Clearance: 51.3 mL/min (A) (by C-G formula based on SCr of 1.53 mg/dL (H)).   Medical History: Past Medical History:  Diagnosis Date  . Atrial fibrillation ([place])   . CHF (congestive heart failure) ([place])   . Hepatitis C   . Hypertension   . Stroke ([place])   . Visit for monitoring Tikosyn therapy 03/26/2017    Assessment: 57 YOM on Apixaban PTA for hx Afib resumed this admission and now with EKG changes concerning for ischemia. Plans are to d/c Apixaban and initiate a Heparin bridge while awaiting ACS work-up.   The patient's last Apixaban dose was earlier today at 1000 - will check a baseline aPTT/HL this afternoon but would expect for HL to be falsely elevated, so will utilize aPTT values for initial monitoring.   Goal of Therapy:  Heparin level 0.3-0.7 units/ml aPTT 66-102 seconds Monitor platelets by anticoagulation protocol: Yes   Plan:  - D/c Apixaban - Start Heparin at a rate of 1100 units/hr (11 ml/hr) starting at 2200 this evening - Daily  CBC, aPTT, HL - Will continue to monitor for any signs/symptoms of bleeding and will follow up with aPTT in 6 hours after initiating  Thank you for allowing pharmacy to be a part of this patient's care.  Alycia Rossetti, PharmD, BCPS Clinical Pharmacist Clinical phone for 10/30/2018: [phone] 10/30/2018 5:07 PM   **Pharmacist phone directory can now be found on [email or web address] ([email or web address]).  Listed under [place].

## 2018-10-30 NOTE — TOC Benefit Eligibility Note (Signed)
Transition of Care [facility]) Benefit Eligibility Note    Patient Details  Name: Glenn [name] MRN: [id number] Date of Birth: 31-Mar-1950   Medication/Dose: Dofetilide covered copay is 1.30 this is Tier 2 Patient is on [place]  Covered?: No     Prescription Coverage Preferred Pharmacy: Any local Pharmacy  Spoke with Person/Company/Phone Number:: Kylei  Co-Pay: Tikosyn 500mg   bid, 250 mg bid, or 125mg  bid NOT COVERED  Prior Approval: No    TIKOSYN IS NOT VOBERED BUT DOFETILIDE IS COPAY 1.30      Kimika Streater Phone Number: 10/30/2018, 11:03 AM

## 2018-10-30 NOTE — ED Notes (Signed)
Pt not in resp distress, pt progressed down from 15L NRB to 4L [place]. Pt resting comfortably.

## 2018-10-30 NOTE — ED Notes (Signed)
Pt moved to hospital bed, resting comfortably

## 2018-10-30 NOTE — ED Notes (Signed)
Doctor gave orders to go ahead and give coreg and [employer]

## 2018-10-30 NOTE — Consult Note (Addendum)
Cardiology Consultation:   Patient ID: Glenn [name] MRN: [id number]; DOB: [birth year]  Admit date: 10/29/2018 Date of Consult: 10/30/2018  Primary Care Provider: [name], MD Primary Cardiologist: Glenn Axe, MD  Primary Electrophysiologist:  None    Patient Profile:   Glenn [name] is a 68 y.o. male with a hx of Afib with ICD, Chronic systolic Heart failure (EF 30-35% 2018), NICM (reported nonobstructive CAD on cath in [place]), noncompliance, hepatitis C, hypertension, h/o stroke, tobacco abuse, h/o of cocaine use who is being seen today for the evaluation of heart failure at the request of Glenn [name].  History of Present Illness:   Glenn [name] moved from [place] in May 2018. Patient had a stroke in 2018 with left MCA CVA s/p thrombectomy for left M2 occlusion. He apparently missed some doses of Xarelto. Patient was put on eliquis and aspirin. 2-D eho showed LVEF 30-35% with diffuse hypokinesis. After discharge patient started seeing Dr. Caryl [name] regularly. He reported a 2 year h/o of Afib and had an ICD- [employer] implanted 12/2015. He reported a heart cath in [place] showing nonobstructive CAD.  Device interrogation in August 2018 showed rapid atrial rates and low dose diltiazem was added being that afib was thought to be contributing to heart failure. Patient had a successful cardioversion in 10/10/2016. Patient apparently converted back to afib and discussed option of amiodarone or dofetilide. In December of 2018 patient stopped his Lisinopril because it was making him dizzy. B/P in the office in January 2019 was 98/46. He also felt his heart was skipping and racing. He saw afib cliinc in 03/2017 and was admitted for Tikosyn and had a successful cardioversion. At f/u appointment 07/2017 patient was back in afib and patient was placed back on cardizem for RVR. Patient reported compliance with medications. Patient was then scheduled for another cardioversion but it was not performed  because patient was found to be in sinus bradycardia. Follow up in afib clinic in 09/2018 patient was in afib and he reported some noncompliance with his Tikosyn. The last visit was 10/23/18 and was having progressive exertional dyspnea. Diuretic was increased for 3 days to 60 mg daily. Cardioversion was performed 10/29/18 and was successful. He had transient hypotension and required IV ephedrine and phenylephrine. Patient went home and took a nap but woke up feeling acutely short of breath and when he didn't improve he called EMS.   The patient was brought back to the ED via EMS and required a nonrebreather in route. DOE had been progressively worsening despite increasing lasix dose. He denied chest [name], fever, or chills. He had associated cough as well. Edema was noted on exam as well as wheezing in his lungs. EKG showed NSR, 82 bpm, QTc 509. Second EKG showed sinus tachycardia, 122 bpm, QTc 489. BP was 161/96. Considering acute heart failure vs COPD exacerbation patient was given duoneb, Mag, and solumedrol. Breathing improved and HR dropped. Labs revealed BNP 1167. HS troponin 44 > 55. Urine drug screen showed Benzos but reports taking diazepam 5 mg daily. Chest x-ray indicated mild cardiomegaly with mildly increased interstitial markings. Patient was given IV lasix 40 mg which improved breathing. Patient was admitted to telemetry and an Echo was ordered.   He admits to going out to eat more often earlier this month. Family history positive for high blood pressure on both sides of his family. He says he has decreased tobacco use to 1 pak per 3-4 days. Drinks alcohol, 1-2 beers once weekly. At baseline  patient says he is active, regularly mowing his grass and doing heavy yardwork outside.   On exam patient is no longer on O2. He says his breathing is much better today. He denies chest [name] or cough. Patient says he had missed some doses of lasix as well as Tikosyn before admission.   Heart Pathway Score:      Past Medical History:  Diagnosis Date  . Atrial fibrillation ([place])   . CHF (congestive heart failure) ([place])   . Hepatitis C   . Hypertension   . Stroke ([place])   . Visit for monitoring Tikosyn therapy 03/26/2017    Past Surgical History:  Procedure Laterality Date  . CARDIAC DEFIBRILLATOR PLACEMENT  2015  . CARDIOVERSION N/A 10/10/2016   Procedure: CARDIOVERSION;  Surgeon: Glenn Spark, MD;  Location: [facility] ENDOSCOPY;  Service: Cardiovascular;  Laterality: N/A;  . CARDIOVERSION N/A 03/27/2017   Procedure: CARDIOVERSION;  Surgeon: Glenn Pain, MD;  Location: [place];  Service: Cardiovascular;  Laterality: N/A;  . EYE SURGERY Left 1990  . IR PERCUTANEOUS ART THROMBECTOMY/INFUSION INTRACRANIAL INC DIAG ANGIO  09/05/2016  . IR RADIOLOGIST EVAL & MGMT  10/03/2016  . RADIOLOGY WITH ANESTHESIA N/A 09/05/2016   Procedure: RADIOLOGY WITH ANESTHESIA;  Surgeon: Glenn Bras, MD;  Location: [place];  Service: Radiology;  Laterality: N/A;     Home Medications:  Prior to Admission medications   Medication Sig Start Date End Date Taking? Authorizing Provider  albuterol (VENTOLIN HFA) 108 (90 Base) MCG/ACT inhaler Inhale 2 puffs into the lungs every 4 (four) hours as needed for shortness of breath. 10/20/18  Yes [provider]  carvedilol (COREG) 25 MG tablet TAKE 1 TABLET BY MOUTH TWICE DAILY WITH A MEAL Patient taking differently: Take 25 mg by mouth 2 (two) times daily with a meal.  09/25/18  Yes Glenn Dawn, MD  Cyanocobalamin (B-12) 500 MCG TABS Take 500 mcg by mouth daily as needed (energy). Patient taking differently: Take 500 mcg by mouth daily.  09/27/17  Yes Glenn Dawn, MD  diazepam (VALIUM) 5 MG tablet TAKE 1/2 TABLET(2.5 MG) BY MOUTH EVERY 8 HOURS AS NEEDED FOR ANXIETY Patient taking differently: Take 5 mg by mouth 2 (two) times daily as needed for anxiety.  10/24/18  Yes Glenn Dawn, MD  dofetilide (TIKOSYN) 500 MCG capsule TAKE 1 CAPSULE BY MOUTH TWICE A DAY  Patient taking differently: Take 500 mcg by mouth 2 (two) times daily.  09/25/18  Yes Glenn Sprang, MD  ELIQUIS 5 MG TABS tablet TAKE 1 TABLET BY MOUTH TWICE DAILY Patient taking differently: Take 5 mg by mouth 2 (two) times daily.  06/23/18  Yes Glenn Sprang, MD  furosemide (LASIX) 40 MG tablet Take 1 tablet (40 mg total) by mouth daily. 10/15/18  Yes Sherran Needs, NP  gabapentin (NEURONTIN) 300 MG capsule TAKE 1 CAPSULE(300 MG) BY MOUTH THREE TIMES DAILY Patient taking differently: Take 300 mg by mouth 3 (three) times daily.  10/10/18  Yes Glenn Dawn, MD  potassium chloride SA (K-DUR,KLOR-CON) 20 MEQ tablet Take 1 tablet (20 mEq total) by mouth daily. 09/27/17  Yes Glenn Dawn, MD  vitamin C (ASCORBIC ACID) 500 MG tablet Take 500 mg by mouth daily.   Yes [provider]  carvedilol (COREG) 25 MG tablet Take 1 tablet (25 mg total) by mouth 2 (two) times daily with a meal. Patient not taking: Reported on 10/30/2018 10/29/18   Croitoru, Mihai, MD  Glecaprevir-Pibrentasvir (MAVYRET) 100-40 MG TABS Take 3 tablets  by mouth daily with supper. Patient not taking: Reported on 10/24/2018 06/10/17   Conchas Dam Callas, NP    Inpatient Medications: Scheduled Meds: . albuterol  8 puff Inhalation Once  . apixaban  5 mg Oral BID  . carvedilol  25 mg Oral BID WC  . diltiazem  120 mg Oral Q breakfast  . dofetilide  500 mcg Oral BID  . Ipratropium-Albuterol  2 puff Inhalation Q6H  . potassium chloride SA  20 mEq Oral Daily  . vitamin B-12  500 mcg Oral Daily  . vitamin C  500 mg Oral Daily   Continuous Infusions:  PRN Meds: acetaminophen **OR** acetaminophen, albuterol, diazepam, perflutren lipid microspheres (DEFINITY) IV suspension, traMADol  Allergies:    Allergies  Allergen Reactions  . Benadryl [Diphenhydramine] Palpitations    Social History:   Social History   Socioeconomic History  . Marital status: Single    Spouse name: Not on file  . Number of children: Not on  file  . Years of education: 48 (some college)  . Highest education level: Not on file  Occupational History  . Occupation: disability  Social Needs  . Financial resource strain: Not on file  . Food insecurity    Worry: Not on file    Inability: Not on file  . Transportation needs    Medical: Not on file    Non-medical: Not on file  Tobacco Use  . Smoking status: Current Some Day Smoker    Packs/day: 0.50    Types: Cigarettes  . Smokeless tobacco: Never Used  . Tobacco comment: a pack last three days  Substance and Sexual Activity  . Alcohol use: Not Currently    Alcohol/week: 3.0 standard drinks    Types: 3 Cans of beer per week    Comment: pt stop drinking   . Drug use: Yes    Frequency: 2.0 times per week    Types: Marijuana    Comment: stop smoking   . Sexual activity: Yes    Partners: Female    Birth control/protection: Condom  Lifestyle  . Physical activity    Days per week: Not on file    Minutes per session: Not on file  . Stress: Not on file  Relationships  . Social Herbalist on phone: Not on file    Gets together: Not on file    Attends religious service: Not on file    Active member of club or organization: Not on file    Attends meetings of clubs or organizations: Not on file    Relationship status: Not on file  . Intimate partner violence    Fear of current or ex partner: Not on file    Emotionally abused: Not on file    Physically abused: Not on file    Forced sexual activity: Not on file  Other Topics Concern  . Not on file  Social History Narrative  . Not on file    Family History:   Family History  Problem Relation Age of Onset  . Stroke Maternal Aunt   . Heart disease Neg Hx      ROS:  Please see the history of present illness.  All other ROS reviewed and negative.     Physical Exam/Data:   Vitals:   10/30/18 0900 10/30/18 0934 10/30/18 1000 10/30/18 1003  BP: (!) 110/91 110/84  (!) 126/97  Pulse: (!) 140 (!) 122  91   Resp: (!) 24 18  18   Temp:    Marland Kitchen)  97.4 F (36.3 C)  TempSrc:    Oral  SpO2: 94% 96%  97%  Weight:   91.7 kg   Height:   5\' 7"  (1.702 m)     Intake/Output Summary (Last 24 hours) at 10/30/2018 1011 Last data filed at 10/30/2018 0903 Gross per 24 hour  Intake -  Output 1075 ml  Net -1075 ml   Last 3 Weights 10/30/2018 10/29/2018 10/22/2018  Weight (lbs) 202 lb 1.6 oz 214 lb 211 lb 12.8 oz  Weight (kg) 91.672 kg 97.07 kg 96.072 kg     Body mass index is 31.65 kg/m.  General:  Well nourished, well developed, in no acute distress HEENT: normal Lymph: no adenopathy Neck: no JVD Endocrine:  No thryomegaly Vascular: No carotid bruits; FA pulses 2+ bilaterally without bruits  Cardiac:  normal S1, S2; RRR; no murmur  Lungs:  Diminished breath sounds bilaterally, no wheezing, rhonchi or rales  Abd: soft, nontender, no hepatomegaly  Ext: Trace edema Musculoskeletal:  No deformities, BUE and BLE strength normal and equal Skin: warm and dry  Neuro:  CNs 2-12 intact, no focal abnormalities noted Psych:  Normal affect   EKG:  The EKG was personally reviewed and demonstrates:  Yesterday shows sinus tachycardia 122 bpm Today EKG shows afib, 126 bpm, occasional PVC, QTc 549 Telemetry:  Telemetry was personally reviewed and demonstrates:  Afib rates 110-120; converted to NSR around 11:00, rates in the 70s; occasional PVCs; events of NSVT, longest run was 6 beats  Relevant CV Studies:  Echo     1. The left ventricle has severely reduced systolic function, with an ejection fraction of 15-20%. The cavity size was moderately dilated. There is moderate concentric left ventricular hypertrophy. Left ventricular diastolic function could not be  evaluated secondary to atrial fibrillation. Left ventricular diffuse hypokinesis.  2. The right ventricle has moderately reduced systolic function. The cavity was moderately enlarged. There is no increase in right ventricular wall thickness. Right ventricular  systolic pressure is moderately elevated with an estimated pressure of 45.5  mmHg.  3. Left atrial size was moderately dilated.  4. Right atrial size was severely dilated.  5. Tricuspid valve regurgitation is severe.  6. Mild thickening of the aortic valve. Aortic valve regurgitation is moderate by color flow Doppler.  7. The aorta is normal unless otherwise noted.  Laboratory Data:  High Sensitivity Troponin:   Recent Labs  Lab 10/29/18 2155 10/29/18 2355 10/30/18 0127 10/30/18 0339  TROPONINIHS 45* 55* 58* 52*     Chemistry Recent Labs  Lab 10/29/18 0939 10/29/18 2155 10/29/18 2303 10/30/18 0338  NA 142 138 140 139  K 4.1 4.4 4.8 4.4  CL 105 104  --  105  CO2  --  26  --  24  GLUCOSE 120* 167*  --  134*  BUN 17 16  --  19  CREATININE 1.20 1.94*  --  1.53*  CALCIUM  --  9.1  --  8.9  GFRNONAA  --  35*  --  47*  GFRAA  --  41*  --  54*  ANIONGAP  --  8  --  10    Recent Labs  Lab 10/29/18 2155  PROT 7.9  ALBUMIN 4.3  AST 31  ALT 27  ALKPHOS 100  BILITOT 1.1   Hematology Recent Labs  Lab 10/29/18 2155 10/29/18 2303 10/30/18 0338  WBC 7.1  --  6.3  RBC 4.41  --  3.84*  HGB 14.2 14.3 12.2*  HCT 44.8  42.0 37.3*  MCV 101.6*  --  97.1  MCH 32.2  --  31.8  MCHC 31.7  --  32.7  RDW 14.0  --  13.5  PLT 222  --  179   BNP Recent Labs  Lab 10/29/18 2155  BNP 1,167.1*    DDimer No results for input(s): DDIMER in the last 168 hours.   Radiology/Studies:  Dg Chest Port 1 View  Result Date: 10/29/2018 CLINICAL DATA:  Shortness of breath EXAM: PORTABLE CHEST 1 VIEW COMPARISON:  February 14, 2017 FINDINGS: There is cardiomegaly. A left-sided pacemaker seen with the lead tip in the right atrium. Mildly increased interstitial markings seen within the lower lungs. No large airspace consolidation. IMPRESSION: Mild cardiomegaly. Mildly increased interstitial markings which could be due to atelectasis and/or mild interstitial edema. Electronically Signed   By:  Prudencio Pair M.D.   On: 10/29/2018 21:47    Assessment and Plan:   Acute Systolic HF [id number] s/p ICD Patient presented with DOE for the last 3-4 days. He has history of afib which is felt to be a contributer to heart failure. He underwent successful cardioversion yesterday morning 9/9 but returned to the ED via EMS in respiratory distress requiring 5L [place] in the ED. CXR showed cardiomegaly and increase interstitial markings. BNP 1167. EKG showed sinus tachycardia 122 bpm. He was also treated for COPD exacerbation with duoneb and solumedrol. Patient was felt to be fluid overloaded and given IV lasix 40 mg which improved breathing.  - At home patient is on Lasix 40 mg, but had been taking 60 mg 3 days prior to admission. - Echo on 08/2016 showed EF 30-35% with diffuse hypokinesis, RA moderately dilated.  - Repeat Echo pending - ICD battery last check 5/18/202 showing remote is normal. Battery status is good. Lead measurements unchanged. Histograms are appropriate. Nonsustained VT present  - On admission patient was in sinus tach but it appears he had converted to afib RVR overnight. Patient was given home tikosyn and dilt this AM and is now in NSR rates in the 70s. - Patient has trace edema on exam. - Would give another dose of IV lasix 40 mg - Creatinine improving 1.94 > 1.53 - Patient receiving potassium supplementation. - Weight on admission 202 lbs which patient says around his baseline. - Continue Coreg - Strict I&Os  Afib RVR - Patient had successful cardioversion 10/29/18 but returned to the ED later with progressive SOB requiring 5L [place]. In the ED patient was in sinus tach 122 bpm. Improved after duoneb, solumedrol and IV lasix.  - This AM patient was found to be in afib RVR and was given home dose tikosyn and dilt. QTC 480 this AM. - It appears patient converted to NSR this morning around 11:00 AM rates now in the 70s - He has been asymptomatic - Potassium 4.4 and mag 2.2 this AM  - continue Eliquis - Will repeat an EKG now that he is in NSR - Daily EKG to check QTc  AKI - Baseline 1.1-1.2 - On admission 1.94 > 1.53 - Avoid nephrotoxic agents - daily BMPs  Hypertension - BP have been reasonable. This AM 126/97 - Continue home Coreg 25 mg BID - Monitor  Tobacco use disorder - Smoker 30-40 years - Recommend cessation  H/o stroke - stroke in 2018 with no residual effects - Continue Eliquis  Dyslipidemia - FLP showed HDL 26, LDL 76, TG 48  For questions or updates, please contact [facility] Please consult [email or web address] for  contact info under     Signed, Cadence Ninfa Meeker, PA-C  10/30/2018 10:11 AM   History and all data above reviewed.  Patient examined.  I agree with the findings as above.   The patient has had increased dyspnea for a few weeks.  He has cardiomyopathy and atrial fib.  He has missed doses of his Tikosyn because he reports that the pharmacy did not have any.  He had cardioversion yesterday and went home and became acutely SOB.  Found to have acute volume overload on admission.  He has responded to diuresis.  He did have transient atrial fib after admission but currently is in NSR.  Now found to have worsening EF as above.  Also with significant changes in the EKG with deep new T wave inversion in the anterolateral lead and prolonged QTC    The patient exam reveals COR:RRR  ,  Lungs: Clear  ,  Abd: Positive bowel sounds, no rebound no guarding, Ext No edema  .  All available labs, radiology testing, previous records reviewed. Agree with documented assessment and plan.   CARDIOMYOPATHY:  Now with worsening EF.  He will need repeat right and left heart cath.  Given the EKG changes we will heparinize and check enzymes.  Stop Tikosyn.  AKI  Hold off on further diuresis and follow renal function to determine the timing of cath.  Will hold on resuming lisinopril pending a possible cath.  He will either need ACE/ARB/ARNI or BiDil at discharge.      Jeneen Rinks Lyan Holck  5:19 PM  10/30/2018

## 2018-10-30 NOTE — ED Notes (Signed)
Per Dr. Shan Levans ,  [facility] to administer Tikosyn since they manually calculated QTC and it was 480

## 2018-10-30 NOTE — Progress Notes (Addendum)
I was notified about f/u EKG after receiving Tikosyn. EKG was concerning for ischemia with new T wave inversions anterolateral leads and QTc was 645 ms. Spoke to the patient and he denies chest pain or worsening shortness of breath. Will discontinue the Tikosyn. Will change Eliquis to heparin and trend HS troponin. Plan discussed with Dr. Percival Spanish.  Cadence Kathlen Mody, PA-C

## 2018-10-30 NOTE — Plan of Care (Signed)
  Problem: Education: Goal: Knowledge of disease or condition will improve Outcome: Progressing Goal: Understanding of medication regimen will improve Outcome: Progressing Goal: Individualized Educational Video(s) Outcome: Progressing   Problem: Activity: Goal: Ability to tolerate increased activity will improve Outcome: Progressing   Problem: Cardiac: Goal: Ability to achieve and maintain adequate cardiopulmonary perfusion will improve Outcome: Progressing   Problem: Health Behavior/Discharge Planning: Goal: Ability to safely manage health-related needs after discharge will improve Outcome: Progressing   Problem: Education: Goal: Ability to demonstrate management of disease process will improve Outcome: Progressing Goal: Ability to verbalize understanding of medication therapies will improve Outcome: Progressing Goal: Individualized Educational Video(s) Outcome: Progressing   Problem: Activity: Goal: Capacity to carry out activities will improve Outcome: Progressing   Problem: Cardiac: Goal: Ability to achieve and maintain adequate cardiopulmonary perfusion will improve Outcome: Progressing   Problem: Education: Goal: Knowledge of General Education information will improve Description: Including pain rating scale, medication(s)/side effects and non-pharmacologic comfort measures Outcome: Progressing   Problem: Health Behavior/Discharge Planning: Goal: Ability to manage health-related needs will improve Outcome: Progressing   Problem: Clinical Measurements: Goal: Ability to maintain clinical measurements within normal limits will improve Outcome: Progressing Goal: Will remain free from infection Outcome: Progressing Goal: Diagnostic test results will improve Outcome: Progressing Goal: Respiratory complications will improve Outcome: Progressing Goal: Cardiovascular complication will be avoided Outcome: Progressing   Problem: Activity: Goal: Risk for activity  intolerance will decrease Outcome: Progressing   Problem: Nutrition: Goal: Adequate nutrition will be maintained Outcome: Progressing   Problem: Coping: Goal: Level of anxiety will decrease Outcome: Progressing   Problem: Elimination: Goal: Will not experience complications related to bowel motility Outcome: Progressing Goal: Will not experience complications related to urinary retention Outcome: Progressing   Problem: Pain Managment: Goal: General experience of comfort will improve Outcome: Progressing   Problem: Safety: Goal: Ability to remain free from injury will improve Outcome: Progressing   Problem: Skin Integrity: Goal: Risk for impaired skin integrity will decrease Outcome: Progressing   

## 2018-10-30 NOTE — Progress Notes (Signed)
FPTS Interim Progress Note  S: Patient reports feeling much better this morning.  He says that his abdomen continues to be somewhat bloated, but he is breathing much better.  O: BP 135/83   Pulse 73   Temp 98.8 F (37.1 C) (Axillary)   Resp 19   SpO2 95%   General: Patient sitting up in bed appearing comfortable, eating breakfast Cardiac: Irregular rhythm, tachycardic rate at about 130, no JVD, trace pedal edema bilaterally Respiratory: Comfortable work of breathing on room air, some reduced air movement in bases but no crackles auscultated Abdomen: Soft, nontender, mildly distended, normal bowel sounds   A/P: Atrial fibrillation with RVR s/p cardioversion on 9/9: Continues to be in atrial fibrillation with RVR.  Will give home diltiazem and Tikosyn since manually calculated QTC is around 480.  Patient has already received home carvedilol.  Cardiology has been consulted, will appreciate their recommendations.  Acute CHF exacerbation: Likely due to atrial fibrillation with RVR.  Improved after 1 dose of Lasix 40 mg IV.  Creatinine 1.53 this morning, improved from 1.94 on admission likely due to improved perfusion.  Consider repeat dose of Lasix 40 mg IV, will await cardiology recommendations.  Kathrene Alu, MD 10/30/2018, 8:42 AM PGY-3, [place] [facility] pager [phone]

## 2018-10-30 NOTE — ED Notes (Signed)
Pt becomes tachy, and in and out of A. Fib. RN paged doctor.

## 2018-10-31 ENCOUNTER — Encounter (HOSPITAL_COMMUNITY): Payer: Self-pay | Admitting: General Practice

## 2018-10-31 LAB — APTT
aPTT: 32 seconds (ref 24–36)
aPTT: 32 seconds (ref 24–36)

## 2018-10-31 LAB — BASIC METABOLIC PANEL
Anion gap: 10 (ref 5–15)
BUN: 25 mg/dL — ABNORMAL HIGH (ref 8–23)
CO2: 28 mmol/L (ref 22–32)
Calcium: 9.1 mg/dL (ref 8.9–10.3)
Chloride: 101 mmol/L (ref 98–111)
Creatinine, Ser: 1.65 mg/dL — ABNORMAL HIGH (ref 0.61–1.24)
GFR calc Af Amer: 49 mL/min — ABNORMAL LOW (ref >60)
GFR calc non Af Amer: 43 mL/min — ABNORMAL LOW (ref >60)
Glucose, Bld: 160 mg/dL — ABNORMAL HIGH (ref 70–99)
Potassium: 4.4 mmol/L (ref 3.5–5.1)
Sodium: 139 mmol/L (ref 135–145)

## 2018-10-31 LAB — HIV ANTIBODY (ROUTINE TESTING W REFLEX): HIV Screen 4th Generation wRfx: NONREACTIVE

## 2018-10-31 LAB — CBC
HCT: 35.6 % — ABNORMAL LOW (ref 39.0–52.0)
Hemoglobin: 11.8 g/dL — ABNORMAL LOW (ref 13.0–17.0)
MCH: 32.4 pg (ref 26.0–34.0)
MCHC: 33.1 g/dL (ref 30.0–36.0)
MCV: 97.8 fL (ref 80.0–100.0)
Platelets: 184 10*3/uL (ref 150–400)
RBC: 3.64 MIL/uL — ABNORMAL LOW (ref 4.22–5.81)
RDW: 13.7 % (ref 11.5–15.5)
WBC: 8.4 10*3/uL (ref 4.0–10.5)
nRBC: 0 % (ref 0.0–0.2)

## 2018-10-31 LAB — HEPARIN LEVEL (UNFRACTIONATED): Heparin Unfractionated: 1.74 IU/mL — ABNORMAL HIGH (ref 0.30–0.70)

## 2018-10-31 LAB — TSH: TSH: 2.566 u[IU]/mL (ref 0.350–4.500)

## 2018-10-31 LAB — MAGNESIUM: Magnesium: 2 mg/dL (ref 1.7–2.4)

## 2018-10-31 MED ORDER — IPRATROPIUM-ALBUTEROL 0.5-2.5 (3) MG/3ML IN SOLN: 3.0000 mL | Freq: Four times a day (QID) | RESPIRATORY_TRACT | Status: DC | PRN

## 2018-10-31 MED ORDER — GABAPENTIN 300 MG PO CAPS
300.0000 mg | ORAL_CAPSULE | Freq: Three times a day (TID) | ORAL | Status: DC
  Administered 2018-10-31 – 2018-11-06 (×18): 300 mg via ORAL
  Filled 2018-10-31 (×19): qty 1

## 2018-10-31 NOTE — Progress Notes (Signed)
Patient is alert and oriented,patient had 10 beats of VTach, pt. Denies pain, VSS. MD notified. Will continue to monitor the patient.

## 2018-10-31 NOTE — Progress Notes (Signed)
Progress Note  Patient Name: Glenn [name] Date of Encounter: 10/31/2018  Primary Cardiologist:   Virl Axe, MD   Subjective   He feels fine.  Denies SOB. No pain.  Inpatient Medications    Scheduled Meds: . carvedilol  25 mg Oral BID WC  . diltiazem  120 mg Oral Q breakfast  . ipratropium-albuterol  3 mL Nebulization TID  . potassium chloride SA  20 mEq Oral Daily  . vitamin B-12  500 mcg Oral Daily  . vitamin C  500 mg Oral Daily   Continuous Infusions: . heparin 1,400 Units/hr (10/31/18 0501)   PRN Meds: acetaminophen **OR** acetaminophen, albuterol, diazepam   Vital Signs    Vitals:   10/31/18 0109 10/31/18 0458 10/31/18 0640 10/31/18 0841  BP: (!) 100/54 116/76  129/70  Pulse: 69 64  71  Resp: 20 20  16   Temp: (!) 97.4 F (36.3 C) (!) 97.5 F (36.4 C)  97.6 F (36.4 C)  TempSrc: Oral Oral  Oral  SpO2: 98% 100%  99%  Weight:   91.7 kg   Height:        Intake/Output Summary (Last 24 hours) at 10/31/2018 0849 Last data filed at 10/31/2018 0842 Gross per 24 hour  Intake 535.27 ml  Output 2050 ml  Net -1514.73 ml   Filed Weights   10/30/18 1000 10/31/18 0640  Weight: 91.7 kg 91.7 kg    Telemetry    NSR, brief run of NSVT, atrial tach - Personally Reviewed  ECG    NSR, PACs, QT markedly prolonged.  Deep anterolateral T wave inversion.  - Personally Reviewed  Physical Exam   GEN: No acute distress.   Neck: No  JVD Cardiac: RRR, no murmurs, rubs, or gallops.  Respiratory: Clear  to auscultation bilaterally. GI: Soft, nontender, non-distended  MS: No  edema; No deformity. Neuro:  Nonfocal  Psych: Normal affect   Labs    Chemistry Recent Labs  Lab 10/29/18 0939 10/29/18 2155 10/29/18 2303 10/30/18 0338  NA 142 138 140 139  K 4.1 4.4 4.8 4.4  CL 105 104  --  105  CO2  --  26  --  24  GLUCOSE 120* 167*  --  134*  BUN 17 16  --  19  CREATININE 1.20 1.94*  --  1.53*  CALCIUM  --  9.1  --  8.9  PROT  --  7.9  --   --    ALBUMIN  --  4.3  --   --   AST  --  31  --   --   ALT  --  27  --   --   ALKPHOS  --  100  --   --   BILITOT  --  1.1  --   --   GFRNONAA  --  35*  --  47*  GFRAA  --  41*  --  54*  ANIONGAP  --  8  --  10     Hematology Recent Labs  Lab 10/29/18 2155 10/29/18 2303 10/30/18 0338  WBC 7.1  --  6.3  RBC 4.41  --  3.84*  HGB 14.2 14.3 12.2*  HCT 44.8 42.0 37.3*  MCV 101.6*  --  97.1  MCH 32.2  --  31.8  MCHC 31.7  --  32.7  RDW 14.0  --  13.5  PLT 222  --  179    Cardiac EnzymesNo results for input(s): TROPONINI in the last 168 hours.  No results for input(s): TROPIPOC in the last 168 hours.   BNP Recent Labs  Lab 10/29/18 2155  BNP 1,167.1*     DDimer No results for input(s): DDIMER in the last 168 hours.   Radiology    Dg Chest Port 1 View  Result Date: 10/29/2018 CLINICAL DATA:  Shortness of breath EXAM: PORTABLE CHEST 1 VIEW COMPARISON:  February 14, 2017 FINDINGS: There is cardiomegaly. A left-sided pacemaker seen with the lead tip in the right atrium. Mildly increased interstitial markings seen within the lower lungs. No large airspace consolidation. IMPRESSION: Mild cardiomegaly. Mildly increased interstitial markings which could be due to atelectasis and/or mild interstitial edema. Electronically Signed   By: Prudencio Pair M.D.   On: 10/29/2018 21:47    Cardiac Studies   ECHO   1. The left ventricle has severely reduced systolic function, with an ejection fraction of 15-20%. The cavity size was moderately dilated. There is moderate concentric left ventricular hypertrophy. Left ventricular diastolic function could not be evaluated secondary to atrial fibrillation. Left ventricular diffuse hypokinesis. 2. The right ventricle has moderately reduced systolic function. The cavity was moderately enlarged. There is no increase in right ventricular wall thickness. Right ventricular systolic pressure is moderately elevated with an estimated pressure of 45.5 mmHg.  3. Left atrial size was moderately dilated. 4. Right atrial size was severely dilated. 5. Tricuspid valve regurgitation is severe. 6. Mild thickening of the aortic valve. Aortic valve regurgitation is moderate by color flow Doppler. 7. The aorta is normal unless otherwise noted. Patient Profile     68 y.o. male y.o. male with a hx of Afib with ICD, Chronic systolic Heart failure (EF 30-35% 2018), NICM (reported nonobstructive CAD on cath in [place]), noncompliance, hepatitis C, hypertension, h/o stroke, tobacco abuse, h/o of cocaine use who is being seen for the evaluation of heart failure at the request of Dr. Owens Shark.    Assessment & Plan    CARDIOMYOPATHY:    Negative 2100 cc this admit.  Holding diuretic with increased creat and need for cardiac cath.    I am going to stop the Cardizem and I would suggest starting BiDil or ARB before discharge (if creat allows.)  Trop is not diagnostic of an acute coronary syndrome.    Check TSH.     CKD:    Hold diuresis over the weekend.  I would suggest right and left heart cath for Monday pending the creat.  (Not on the board yet.)     ATRIAL FIB:  Holding Tikosyn with prolonged QT.    Currently maintaining NSR.  Hold off on Eliquis pending invasive evaluation.  Continue heparin.      For questions or updates, please contact [place] Please consult [email or web address] for contact info under Cardiology/STEMI.   Signed, Minus Breeding, MD  10/31/2018, 8:49 AM

## 2018-10-31 NOTE — Telephone Encounter (Signed)
Pt currently admitted.

## 2018-10-31 NOTE — Progress Notes (Signed)
[place] for Apixaban Transition to Heparin Indication: atrial fibrillation, new concern for ACS  Allergies  Allergen Reactions  . Benadryl [Diphenhydramine] Palpitations    Patient Measurements: Height: 5\' 7"  (170.2 cm) Weight: 202 lb 3.2 oz (91.7 kg) IBW/kg (Calculated) : 66.1 Heparin Dosing Weight: 85.3 kg  Vital Signs: Temp: 97.6 F (36.4 C) (09/11 0841) Temp Source: Oral (09/11 0841) BP: 115/79 (09/11 1314) Pulse Rate: 72 (09/11 1314)  Labs: Recent Labs    10/29/18 2155 10/29/18 2303  10/30/18 0338 10/30/18 0339 10/30/18 1649 10/30/18 1826 10/31/18 0407 10/31/18 0819 10/31/18 1221  HGB 14.2 14.3  --  12.2*  --   --   --   --  11.8*  --   HCT 44.8 42.0  --  37.3*  --   --   --   --  35.6*  --   PLT 222  --   --  179  --   --   --   --  184  --   APTT  --   --   --   --   --   --  35 32  --  32  HEPARINUNFRC  --   --   --   --   --   --  >2.20* 1.74*  --   --   CREATININE 1.94*  --   --  1.53*  --   --   --   --  1.65*  --   TROPONINIHS 45*  --    < >  --  52* 31* 32*  --   --   --    < > = values in this interval not displayed.    Estimated Creatinine Clearance: 47.5 mL/min (A) (by C-G formula based on SCr of 1.65 mg/dL (H)).   Medical History: Past Medical History:  Diagnosis Date  . Atrial fibrillation ([place])   . CHF (congestive heart failure) ([place])   . Hepatitis C   . Hypertension   . Stroke ([place])   . Visit for monitoring Tikosyn therapy 03/26/2017    Assessment: 68 year old male on Apixaban prior to admission for history of Afib resumed this admission and now with EKG changes concerning for ischemia. Plans are to discontinue Apixaban and initiate a Heparin bridge while awaiting ACS work-up.   The patient's last Apixaban dose was 10/30/2018 at 1000, baseline HL 1.74, aPTT 32. HL falsely elevated, will use aPTT for now due to apixaban influence on heparin levels. aPTT levels today at 1300 32, increase heparin  rate to obtain goal aPTT levels. No signs or symptoms of bleeding reported by nurse.  Goal of Therapy:  Heparin level 0.3-0.7 units/ml aPTT 66-102 seconds Monitor platelets by anticoagulation protocol: Yes   Plan:  - Increase heparin to 1800 units/hr - 2300 aPTT     Glenn [name], PharmD, [place] [facility] PGY1 Pharmacy Resident 10/31/18      2:19 PM  Please check AMION for all [facility] phone numbers After 10:00 PM, call the [facility] [phone]

## 2018-10-31 NOTE — Progress Notes (Signed)
Glenn [name] for Apixaban >> Heparin Indication: atrial fibrillation, new concern for ACS  Allergies  Allergen Reactions  . Benadryl [Diphenhydramine] Palpitations    Patient Measurements: Height: 5\' 7"  (170.2 cm) Weight: 202 lb 1.6 oz (91.7 kg) IBW/kg (Calculated) : 66.1 Heparin Dosing Weight: 85.3 kg  Vital Signs: Temp: 97.4 F (36.3 C) (09/11 0109) Temp Source: Oral (09/11 0109) BP: 100/54 (09/11 0109) Pulse Rate: 69 (09/11 0109)  Labs: Recent Labs    10/29/18 0939 10/29/18 2155 10/29/18 2303  10/30/18 0338 10/30/18 0339 10/30/18 1649 10/30/18 1826 10/31/18 0407  HGB 13.6 14.2 14.3  --  12.2*  --   --   --   --   HCT 40.0 44.8 42.0  --  37.3*  --   --   --   --   PLT  --  222  --   --  179  --   --   --   --   APTT  --   --   --   --   --   --   --  35 32  HEPARINUNFRC  --   --   --   --   --   --   --  >2.20*  --   CREATININE 1.20 1.94*  --   --  1.53*  --   --   --   --   TROPONINIHS  --  45*  --    < >  --  52* 31* 32*  --    < > = values in this interval not displayed.    Estimated Creatinine Clearance: 51.3 mL/min (A) (by C-G formula based on SCr of 1.53 mg/dL (H)).   Medical History: Past Medical History:  Diagnosis Date  . Atrial fibrillation ([place])   . CHF (congestive heart failure) ([place])   . Hepatitis C   . Hypertension   . Stroke ([place])   . Visit for monitoring Tikosyn therapy 03/26/2017    Assessment: 77 YOM on Apixaban PTA for hx Afib resumed this admission and now with EKG changes concerning for ischemia. Plans are to d/c Apixaban and initiate a Heparin bridge while awaiting ACS work-up.   The patient's last Apixaban dose was earlier today at 1000 - will check a baseline aPTT/HL this afternoon but would expect for HL to be falsely elevated, so will utilize aPTT values for initial monitoring.   9/11 AM update:  APTT is low this AM Using aPTT for now due to apixaban influence on heparin levels  No issues  per RN  Goal of Therapy:  Heparin level 0.3-0.7 units/ml aPTT 66-102 seconds Monitor platelets by anticoagulation protocol: Yes   Plan:  -Inc heparin to 1400 units/hr -1300 aPTT  Narda Bonds, PharmD, BCPS Clinical Pharmacist Phone: [phone]

## 2018-10-31 NOTE — Progress Notes (Signed)
Family Medicine Teaching Service Daily Progress Note Intern Pager: [phone]  Patient name: Glenn [name] Medical record number: [id number] Date of birth: 23-Apr-1950 Age: 68 y.o. Gender: male  Primary Care Provider: [name], MD Consultants: Cardiology Code Status: Full  Pt Overview and Major Events to Date:  9/10- admitted, EKG peaked T, c/f NSTEMI  Assessment and Plan: Glenn [name] is a 69 y.o. male presenting with respiratory distress, now c/f NSTEMI. PMH is significant for AFIB, NICM with ICD, HTN, left MCA CVA, Hep C, hx of cocaine and tobacco abuse.  Cardiomyopathy EKG yesterday evening revealed peaked T waves and QTc prolongation >672ms.  EKG today reveals NSR, PACs, similar peak T waves as yesterday, and QTC prolongation to above 52ms, personally reviewed.  Telemetry personally reviewed with short runs of V. tach.  Tikosyn discontinued, eliquis held, currently on heparin drip with APTT today of 32s, goal 66-102s. Cardiology is following and appreciate recommendations. Troponins 58>31>32. - f/u cardiology recommendations -Heart cath on Monday - Heparin started, APTT today 32, goal 66-102s. -tikosyn discontinued.  Atrial fibrillation with RVR s/p cardioversion on 9/9: No longer in A. Fib.  QTc 9/10 >600, tikosyn discontinued. Today NSR, PACs, QTc greater than 580 ms with continued peaked T waves, please see above problem. Cardiology has been consulted, will appreciate their recommendations.  - f/u cardiology recommendations: for heart cath on Monday - discontinued tikosyn -continue home coreg -heparin drip  Acute CHF exacerbation: Likely due to atrial fibrillation with RVR.  Improved after 1 dose of Lasix 40 mg IV.  Creatinine 1.53 9/10, improved from 1.94 on admission likely due to improved perfusion.  Furosemide 40 mg IV once. Patient wt yesterday 91.7kg, no change today, but likely different scales. UOP yesterday 2.525L. SOB improved.  -STRICT I/Os - Will  consider further dosing of Furosemide   Acute kidney injury Creatinine on admission was 1.94, cr on 9/10 was 1.53, worsened today to 1.65. Previous creatinine on 1.1-1.2 at baseline.  Holding diuresis today due to worsening creatinine. -Avoid IVF - Avoid nephrotoxic agents - daily BMPs  - will consider diuresis as creatinine continues to improve based on symptoms and volume status  Hypertension Blood pressure in the last 24 hours has ranged from systolic [id number], diastolic [id number].  Latest blood pressure recorded was 116/76, appropriate. Patient denies taking any medicine for hypertension, but is on Coreg 25 mg twice daily.   -Continue home medicications -Monitor blood pressures -HDL 26, LDL 76, TG 48, total cholesterol 110 -We will consider statin prior to discharge  Tobacco use disorder Patient endorses smoking 0.5 packs/day for the past 30-40 years.  -Encourage smoking cessation -Nicotine patch if needed  H/o CVA left MCA Stable. Patient had a stroke on 09/05/2016. No residual deficits.  Patient underwent chemical thrombectomy for left M2 occlusion.  Appears the patient previously was on aspirin 81 mg daily, however not recently on this. Unclear patient should be on this. Will await cardiology recommendations.  H/o cocaine use disorder Patient with history of cocaine use .unsure if this is contributing to his acute onset shortness of breath and heart failure.   -UDS positive for benzodiazepines   Chronic Hep C s/p treatment Patient diagnosed in March 2019 likely due to history of 5 urine drug use in the 1980s.  There is no personal or family history of liver disease.  Patient received treatment for hep C in April 2019  FEN/GI: heart healthy diet PPx: heparin  Disposition: Awaiting heart cath on Monday  Subjective:  Patient  is doing well this morning, no chest pain, no shortness of breath, volume status appears euvolemic with no lower extremity edema, no JVP, clear to  auscultation bilaterally.  He asked to take a shower, but cannot remove telemetry at this time due to several runs of V. tach overnight.  Objective: Temp:  [97.4 F (36.3 C)-97.9 F (36.6 C)] 97.5 F (36.4 C) (09/11 0458) Pulse Rate:  [60-140] 64 (09/11 0458) Resp:  [18-24] 20 (09/11 0458) BP: (100-137)/(54-97) 116/76 (09/11 0458) SpO2:  [94 %-100 %] 100 % (09/11 0458) Weight:  [91.7 kg] 91.7 kg (09/11 0640) Physical Exam: General: Well-appearing, resting comfortably in bed, no acute distress Cardiovascular: Regular rate, normal sinus rhythm, no gallop heard today, no JVP Respiratory: Clear to auscultation bilaterally, normal work of breathing, no [place] O2 requirement Abdomen: Soft, nontender, nondistended, normoactive bowel sounds Extremities: No lower extremity edema  Laboratory: Recent Labs  Lab 10/29/18 2155 10/29/18 2303 10/30/18 0338  WBC 7.1  --  6.3  HGB 14.2 14.3 12.2*  HCT 44.8 42.0 37.3*  PLT 222  --  179   Recent Labs  Lab 10/29/18 0939 10/29/18 2155 10/29/18 2303 10/30/18 0338  NA 142 138 140 139  K 4.1 4.4 4.8 4.4  CL 105 104  --  105  CO2  --  26  --  24  BUN 17 16  --  19  CREATININE 1.20 1.94*  --  1.53*  CALCIUM  --  9.1  --  8.9  PROT  --  7.9  --   --   BILITOT  --  1.1  --   --   ALKPHOS  --  100  --   --   ALT  --  27  --   --   AST  --  31  --   --   GLUCOSE 120* 167*  --  134*    Imaging/Diagnostic Tests: Dg Chest Port 1 View  Result Date: 10/29/2018 CLINICAL DATA:  Shortness of breath EXAM: PORTABLE CHEST 1 VIEW COMPARISON:  February 14, 2017 FINDINGS: There is cardiomegaly. A left-sided pacemaker seen with the lead tip in the right atrium. Mildly increased interstitial markings seen within the lower lungs. No large airspace consolidation. IMPRESSION: Mild cardiomegaly. Mildly increased interstitial markings which could be due to atelectasis and/or mild interstitial edema. Electronically Signed   By: Prudencio Pair M.D.   On: 10/29/2018 21:47    Gladys Damme, MD 10/31/2018, 7:58 AM PGY-1, [facility] Intern pager: [phone], text pages welcome

## 2018-10-31 NOTE — Discharge Summary (Signed)
[facility] Discharge Summary  Patient name: Glenn [name] Medical record number: [id number] Date of birth: 04/30/1950 Age: 68 y.o. Gender: male Date of Admission: 10/29/2018  Date of Discharge: 11/06/18  Admitting Physician: Martyn Malay, MD  Primary Care Provider: [name], MD Consultants: Cardiology  Indication for Hospitalization: Volume overload, A. fib with RVR  Discharge Diagnoses/Problem List:  A. fib with RVR Acute on chronic HFrEF  Nonischemic cardiomyopathy Nonsustained V. Tach Acute kidney injury Hypertension Constipation Tobacco abuse disorder History of CVA to the left MCA Chronic hepatitis C  Disposition: Home  Discharge Condition: Stable and improved  Discharge Exam:   GEN:    Pleasant male in no acute distress  EYES:   pupils equal and reactive NECK:  supple, normal ROM RESP:  clear to auscultation bilaterally, no increased work of breathing  CVS:   regular rate and rhythm, no murmur, distal pulses intact   ABD:  soft, non-tender; bowel sounds present; no palpable masses EXT:   normal ROM, atraumatic, no lower extremity edema  NEURO:  normal without focal findings,  speech normal, alert and oriented   Skin:   warm and dry, normal skin turgor Psych: Normal affect and thought content     Brief Hospital Course:  Glenn [name] was admitted with A. fib with RVR 1 day after cardioversion in the outpatient setting at his cardiologist office.  EKG obtained in the ED showed patient was in atrial fibrillation with RVR.  Patient was cardioverted in the outpatient setting the day before his admission. During his admission, his QTc reached 645 ms. Tikosyn and Eliquis were discontinued. Patient had a repeat echocardiogram this admission which showed worsening ejection fraction to 15 to 20%.  Patient received a heart cath, which showed no CAD, but elevated filling pressures consistent with a nonischemic cardiomyopathy with  acute on chronic systolic heart failure.  Tikosyn was restarted as patient's QTc was no longer prolonged.  After the patient did not chemically convert to a sinus rhythm, he had DC cardioversion via TEE.  On the day of discharge patient was in normal sinus rhythm with a QTc 483.  He was diuresed with IV furosemide 40 mg. He also had an acute kidney injury this admission, on admission his creatinine was 1.94, baseline appears to be 1.1-1.2.  On the day of discharge, his creatinine was 1.48 and diuretics were held. Cardiology recommended patient be started on Entresto, spironolactone have his Tikosyn dose reduced.  Patient received these medications prior to discharge.  Patient is medically stable and is appropriate for discharge.  Issues for Follow Up:  1.  Glenn [name] had several medication changes during his hospitalization.  His current cardiology recommendations are listed below. Please ensure the patient is following the current regimen.  -Carvedilol 25 mg BID -Entresto 24-26 mg BID (new) -Lasix 40 mg daily (increased) -Spironolactone 12.5 mg daily (new) -K dur 20 meq daily -Eliquis 5 mg BID -Tikosyn 250 mcg BID  (reduced) 2.  Patient should have a 2D ECHO in 2 months.  3.  Patient started on Entresto and spironolactone.  He is also taking potassium supplements.  Patient creatinine was elevated from his baseline on the day of discharge. Recommend repeating BMP to potential hyperkalemia and resolution of AKI. 4.  Patient currently prescribed Valium.  Consider tapering this medication and switching to a non-benzodiazepine therapy.   Significant Procedures: Heart cath, cardioversion via TEE    Significant Labs and Imaging:  Recent Labs  Lab  11/04/18 0627 11/05/18 0509 11/06/18 0529  WBC 4.7 4.4 5.0  HGB 12.8* 12.6* 11.8*  HCT 37.4* 36.6* 36.4*  PLT 166 164 176   Recent Labs  Lab 11/02/18 0503 11/03/18 0604  11/03/18 1138 11/04/18 0627 11/04/18 0958 11/05/18 0509  11/06/18 0529  NA 141 140   < > 137 140 141 140 137  K 4.4 4.5   < > 4.4 4.3 3.8 3.8 3.9  CL 106 107  --   --  104 103 105 99  CO2 25 23  --   --  25 25 27 28   GLUCOSE 113* 93  --   --  104* 112* 99 124*  BUN 18 16  --   --  15 14 14 15   CREATININE 1.27* 1.23  --   --  1.27* 1.30* 1.28* 1.48*  CALCIUM 9.0 8.9  --   --  8.8* 9.1 8.7* 8.9  MG  --  1.9  --   --  1.8 1.9 1.9 2.0  PHOS 3.6  --   --   --   --   --   --   --   ALBUMIN 3.7  --   --   --   --   --   --   --    < > = values in this interval not displayed.    Lipid Panel     Component Value Date/Time   CHOL 110 10/30/2018 0339   TRIG 48 10/30/2018 0339   HDL 26 (L) 10/30/2018 0339   CHOLHDL 4.2 10/30/2018 0339   VLDL 10 10/30/2018 0339   LDLCALC 74 10/30/2018 0339     Results/Tests Pending at Time of Discharge: None  Discharge Medications:  Allergies as of 11/06/2018      Reactions   Benadryl [diphenhydramine] Palpitations      Medication List    TAKE these medications   albuterol 108 (90 Base) MCG/ACT inhaler Commonly known as: VENTOLIN HFA Inhale 2 puffs into the lungs every 4 (four) hours as needed for shortness of breath.   apixaban 5 MG Tabs tablet Commonly known as: Eliquis Take 1 tablet (5 mg total) by mouth 2 (two) times daily.   B-12 500 MCG Tabs Take 500 mcg by mouth daily as needed (energy). What changed: when to take this   carvedilol 25 MG tablet Commonly known as: COREG Take 1 tablet (25 mg total) by mouth 2 (two) times daily with a meal. What changed: Another medication with the same name was removed. Continue taking this medication, and follow the directions you see here.   diazepam 5 MG tablet Commonly known as: VALIUM TAKE 1/2 TABLET(2.5 MG) BY MOUTH EVERY 8 HOURS AS NEEDED FOR ANXIETY What changed: See the new instructions.   dofetilide 250 MCG capsule Commonly known as: TIKOSYN Take 1 capsule (250 mcg total) by mouth 2 (two) times daily. What changed:   medication  strength  how much to take  when to take this   furosemide 40 MG tablet Commonly known as: LASIX Take 1 tablet (40 mg total) by mouth daily.   gabapentin 300 MG capsule Commonly known as: NEURONTIN TAKE 1 CAPSULE(300 MG) BY MOUTH THREE TIMES DAILY What changed: See the new instructions.   Mavyret 100-40 MG Tabs Generic drug: Glecaprevir-Pibrentasvir Take 3 tablets by mouth daily with supper.   potassium chloride SA 20 MEQ tablet Commonly known as: K-DUR Take 1 tablet (20 mEq total) by mouth daily.   sacubitril-valsartan 24-26 MG Commonly  known as: ENTRESTO Take 1 tablet by mouth 2 (two) times daily.   spironolactone 25 MG tablet Commonly known as: ALDACTONE Take 0.5 tablets (12.5 mg total) by mouth daily. Start taking on: November 07, 2018   vitamin C 500 MG tablet Commonly known as: ASCORBIC ACID Take 500 mg by mouth daily.       Discharge Instructions: Please refer to Patient Instructions section of EMR for full details.  Patient was counseled important signs and symptoms that should prompt return to medical care, changes in medications, dietary instructions, activity restrictions, and follow up appointments.   Follow-Up Appointments: Follow-up Information    Sherran Needs, NP Follow up on 11/10/2018.   Specialties: Nurse Practitioner, Cardiology Why: Hospital follow-up appointment next Wednesday, September 23, at 3:30 PM Contact information: [place] [address] [phone]        [name], MD. Go on 11/12/2018.   Specialty: Family Medicine Why: Please go to your appointment at 1:40PM. Contact information: [address] [address] [phone]        Conrad Pender, NP Follow up on 11/19/2018.   Specialty: Cardiology Why: New patient appointment the heart failure clinic on Wednesday, September 30, at 10:00 AM Contact information: [address] [address] [phone]        [place] Office Follow up on 11/10/2018.   Specialty: Cardiology Why: Please come to [facility] office (where you usually see Dr. [name]) on Monday morning at 8:00 AM for blood work work. It is important to come early so that the afib team has blood work results for your appointment later that afternoon.  Contact information: [address], [address] [place]          Lyndee Hensen, MD 11/06/2018, 9:07 PM PGY-1, [place]

## 2018-11-01 DIAGNOSIS — I420 Dilated cardiomyopathy: Secondary | ICD-10-CM

## 2018-11-01 DIAGNOSIS — R7989 Other specified abnormal findings of blood chemistry: Secondary | ICD-10-CM

## 2018-11-01 DIAGNOSIS — I4811 Longstanding persistent atrial fibrillation: Secondary | ICD-10-CM

## 2018-11-01 LAB — APTT
aPTT: 32 s (ref 24–36)
aPTT: 32 seconds (ref 24–36)
aPTT: 35 seconds (ref 24–36)

## 2018-11-01 LAB — BASIC METABOLIC PANEL WITH GFR
Anion gap: 10 (ref 5–15)
BUN: 24 mg/dL — ABNORMAL HIGH (ref 8–23)
CO2: 27 mmol/L (ref 22–32)
Calcium: 8.9 mg/dL (ref 8.9–10.3)
Chloride: 104 mmol/L (ref 98–111)
Creatinine, Ser: 1.45 mg/dL — ABNORMAL HIGH (ref 0.61–1.24)
GFR calc Af Amer: 58 mL/min — ABNORMAL LOW
GFR calc non Af Amer: 50 mL/min — ABNORMAL LOW
Glucose, Bld: 101 mg/dL — ABNORMAL HIGH (ref 70–99)
Potassium: 4 mmol/L (ref 3.5–5.1)
Sodium: 141 mmol/L (ref 135–145)

## 2018-11-01 LAB — HEPARIN LEVEL (UNFRACTIONATED): Heparin Unfractionated: 0.46 IU/mL (ref 0.30–0.70)

## 2018-11-01 LAB — MAGNESIUM: Magnesium: 2 mg/dL (ref 1.7–2.4)

## 2018-11-01 MED ORDER — ENOXAPARIN SODIUM 100 MG/ML ~~LOC~~ SOLN
90.0000 mg | Freq: Two times a day (BID) | SUBCUTANEOUS | Status: DC
  Administered 2018-11-01 – 2018-11-02 (×2): 90 mg via SUBCUTANEOUS
  Filled 2018-11-01 (×2): qty 1

## 2018-11-01 MED ORDER — POLYETHYLENE GLYCOL 3350 17 G PO PACK
17.0000 g | PACK | Freq: Every day | ORAL | Status: DC
  Administered 2018-11-01: 17 g via ORAL
  Filled 2018-11-01 (×4): qty 1

## 2018-11-01 NOTE — Progress Notes (Signed)
Pt had 5 beat run of V-tach at 14:16. Asymptomatic. Made MD aware.

## 2018-11-01 NOTE — Progress Notes (Signed)
[facility] for Apixaban >> Heparin Indication: atrial fibrillation, new concern for ACS  Allergies  Allergen Reactions  . Benadryl [Diphenhydramine] Palpitations    Patient Measurements: Height: 5\' 7"  (170.2 cm) Weight: 205 lb 12.8 oz (93.4 kg)(scale a) IBW/kg (Calculated) : 66.1 Heparin Dosing Weight: 85.3 kg  Vital Signs: Temp: 97.8 F (36.6 C) (09/12 1150) Temp Source: Oral (09/12 1150) BP: 124/74 (09/12 1649) Pulse Rate: 67 (09/12 1649)  Labs: Recent Labs    10/29/18 2155 10/29/18 2303  10/30/18 0338 10/30/18 0339 10/30/18 1649  10/30/18 1826 10/31/18 0407 10/31/18 0819  10/31/18 2310 11/01/18 0552 11/01/18 0938 11/01/18 1814  HGB 14.2 14.3  --  12.2*  --   --   --   --   --  11.8*  --   --   --   --   --   HCT 44.8 42.0  --  37.3*  --   --   --   --   --  35.6*  --   --   --   --   --   PLT 222  --   --  179  --   --   --   --   --  184  --   --   --   --   --   APTT  --   --   --   --   --   --    < > 35 32  --    < > 32  --  32 35  HEPARINUNFRC  --   --   --   --   --   --   --  >2.20* 1.74*  --   --   --   --  0.46  --   CREATININE 1.94*  --   --  1.53*  --   --   --   --   --  1.65*  --   --  1.45*  --   --   TROPONINIHS 45*  --    < >  --  52* 31*  --  32*  --   --   --   --   --   --   --    < > = values in this interval not displayed.    Estimated Creatinine Clearance: 54.6 mL/min (A) (by C-G formula based on SCr of 1.45 mg/dL (H)).   Medical History: Past Medical History:  Diagnosis Date  . Atrial fibrillation ([place])   . CHF (congestive heart failure) ([place])   . Hepatitis C   . Hypertension   . Stroke ([place])   . Visit for monitoring Tikosyn therapy 03/26/2017    Assessment: 56 YOM on Apixaban PTA for hx Afib resumed this admission and now with EKG changes concerning for ischemia. Apixaban discontinued and Heparin initiated while awaiting ACS work-up. The patient's last Apixaban dose was 09/10 at 1000.   -aPTT remains below goal and within normal limits -heparin running at 2400 units/hr (28 units/kg/hr) -the max aPTT since 9/11 was 35 (9/12)  Spoke w/ DR. Mahoney with Family medicine and also spoke with Dr. Vickki Muff with cardiology. Will change to lovenox   Goal of Therapy:  Heparin level 0.3-0.7 units/ml aPTT 66-102 seconds Monitor platelets by anticoagulation protocol: Yes   Plan:  -Discontinue heparin  -lovenox 90mg  sq q12h -CBC every 3 days  Hildred Laser, PharmD Clinical Pharmacist **Pharmacist phone directory can now be found  on [email or web address] ([email or web address]).  Listed under [place].

## 2018-11-01 NOTE — Progress Notes (Addendum)
Family Medicine Teaching Service Daily Progress Note Intern Pager: [phone]  Patient name: Glenn [name] Medical record number: [id number] Date of birth: [birth year] Age: 68 y.o. Gender: male  Primary Care Provider: [name], MD Consultants: Cardiology Code Status: Full  Pt Overview and Major Events to Date:  9/10- admitted, EKG peaked T  Assessment and Plan: Glenn [name] is a 68 y.o. male presenting with respiratory distress, now c/f NSTEMI. PMH is significant for AFIB, NICM with ICD, HTN, left MCA CVA, Hep C, hx of cocaine and tobacco abuse.  Cardiomyopathy Telemetry personally reviewed with short runs of V. Tach yesterday for ~4 beats, last at 11PM.  EKG improved today reveals NSR, QTc 438 ms.  Previous EKGs showed peaked T waves and prolonged QTC > 580 ms. Tikosyn discontinued, eliquis held, currently on heparin drip with APTT on 9/11 of 32s, pending today's level, goal 66-102s. Cardiology is following and appreciate recommendations. Check K and Mg daily, with goal of repletion to 4 and 2 respectively. Today K 4 and Mg 2. - continue telemetry, attending Dr. Owens Shark - f/u cardiology recommendations -Heart cath on Monday - Heparin started, APTT 32, goal 66-102s. -tikosyn discontinued.  Atrial fibrillation with RVR s/p cardioversion on 9/9: NSR.  QTC today manually calculated to be 438 ms. QTc on 9/10 >600, tikosyn discontinued. Occasional VT. Cardiology has been consulted, will appreciate their recommendations.  - f/u cardiology recommendations: for heart cath on Monday - discontinued tikosyn -continue home coreg -heparin drip  Acute CHF exacerbation-improved: Currently no symptoms of SOB, appears to be euvolemic on exam: No lower extremity edema, no JVP. Likely due to atrial fibrillation with RVR on admission.  Improved after 1 dose of Lasix 40 mg IV on 9/9.  Creatinine 1.45 9/12, improved from 1.94 on admission likely due to improved perfusion.  Patient wt  yesterday 91.7kg, increased to 93.4 kg. UOP overnight 900 cc, total fluid out 2.6 L this admission.  -STRICT I/Os -Hold diuresis for heart cath on Monday  Acute kidney injury Creatinine on admission was 1.94, improved today to 1.45. Previous creatinine on 1.1-1.2 at baseline.   No need for diuresis today. -Avoid IVF - Avoid nephrotoxic agents - daily BMPs  - will consider diuresis as creatinine continues to improve based on symptoms and volume status  Hypertension Blood pressure in the last 24 hours has ranged from systolic [id number], diastolic [id number].  Latest blood pressure recorded was 113/98, appropriate.   Home medication is Coreg 25 mg twice daily.   -Continue home medicications -Monitor blood pressures -HDL 26, LDL 76, TG 48, total cholesterol 110 -We will consider statin prior to discharge  Constipation -start miralax  Tobacco use disorder Patient endorses smoking 0.5 packs/day for the past 30-40 years.  -Encourage smoking cessation -Nicotine patch if needed  H/o CVA left MCA Stable. Patient had a stroke on 09/05/2016. No residual deficits.  Patient underwent chemical thrombectomy for left M2 occlusion.  Appears the patient previously was on aspirin 81 mg daily, however not recently on this. Unclear patient should be on this. Will await cardiology recommendations.  H/o cocaine use disorder Patient with history of cocaine use .unsure if this is contributing to his acute onset shortness of breath and heart failure.   -UDS positive for benzodiazepines   Chronic Hep C s/p treatment Patient diagnosed in March 2019 likely due to history of 5 urine drug use in the 1980s.  There is no personal or family history of liver disease.  Patient received treatment for hep  C in April 2019  FEN/GI: heart healthy diet PPx: heparin  Disposition: Awaiting heart cath on Monday  Subjective:  Patient is doing well this morning, no chest pain, no shortness of breath, volume status appears  euvolemic with no lower extremity edema, no JVP, clear to auscultation bilaterally.  He mentioned he is a little constipated, asked for a laxative.  Objective: Temp:  [97.3 F (36.3 C)-97.7 F (36.5 C)] 97.3 F (36.3 C) (09/12 0504) Pulse Rate:  [67-78] 71 (09/12 0504) Resp:  [14-18] 14 (09/12 0504) BP: (97-129)/(60-98) 113/98 (09/12 0504) SpO2:  [96 %-100 %] 96 % (09/12 0504) Weight:  [93.4 kg] 93.4 kg (09/12 0504) Physical Exam: General: Well-appearing, resting comfortably in bed, no acute distress Cardiovascular: Regular rate, normal sinus rhythm, no gallop heard today, no JVP Respiratory: Clear to auscultation bilaterally, normal work of breathing, no McNeil O2 requirement Abdomen: Soft, nontender, nondistended, normoactive bowel sounds Extremities: No lower extremity edema  Laboratory: Recent Labs  Lab 10/29/18 2155 10/29/18 2303 10/30/18 0338 10/31/18 0819  WBC 7.1  --  6.3 8.4  HGB 14.2 14.3 12.2* 11.8*  HCT 44.8 42.0 37.3* 35.6*  PLT 222  --  179 184   Recent Labs  Lab 10/29/18 2155  10/30/18 0338 10/31/18 0819 11/01/18 0552  NA 138   < > 139 139 141  K 4.4   < > 4.4 4.4 4.0  CL 104  --  105 101 104  CO2 26  --  24 28 27   BUN 16  --  19 25* 24*  CREATININE 1.94*  --  1.53* 1.65* 1.45*  CALCIUM 9.1  --  8.9 9.1 8.9  PROT 7.9  --   --   --   --   BILITOT 1.1  --   --   --   --   ALKPHOS 100  --   --   --   --   ALT 27  --   --   --   --   AST 31  --   --   --   --   GLUCOSE 167*  --  134* 160* 101*   < > = values in this interval not displayed.    Imaging/Diagnostic Tests: Dg Chest Port 1 View  Result Date: 10/29/2018 CLINICAL DATA:  Shortness of breath EXAM: PORTABLE CHEST 1 VIEW COMPARISON:  February 14, 2017 FINDINGS: There is cardiomegaly. A left-sided pacemaker seen with the lead tip in the right atrium. Mildly increased interstitial markings seen within the lower lungs. No large airspace consolidation. IMPRESSION: Mild cardiomegaly. Mildly increased  interstitial markings which could be due to atelectasis and/or mild interstitial edema. Electronically Signed   By: Prudencio Pair M.D.   On: 10/29/2018 21:47   Gladys Damme, MD 11/01/2018, 8:11 AM PGY-1, [place] Intern pager: [phone], text pages welcome

## 2018-11-01 NOTE — Progress Notes (Signed)
[facility] for Apixaban >> Heparin Indication: atrial fibrillation, new concern for ACS  Allergies  Allergen Reactions  . Benadryl [Diphenhydramine] Palpitations    Patient Measurements: Height: 5\' 7"  (170.2 cm) Weight: 202 lb 3.2 oz (91.7 kg) IBW/kg (Calculated) : 66.1 Heparin Dosing Weight: 85.3 kg  Vital Signs: Temp: 97.7 F (36.5 C) (09/11 2033) Temp Source: Oral (09/11 2033) BP: 118/74 (09/11 1945) Pulse Rate: 72 (09/11 2033)  Labs: Recent Labs    10/29/18 2155 10/29/18 2303  10/30/18 0338 10/30/18 0339 10/30/18 1649  10/30/18 1826 10/31/18 0407 10/31/18 0819 10/31/18 1221 10/31/18 2310  HGB 14.2 14.3  --  12.2*  --   --   --   --   --  11.8*  --   --   HCT 44.8 42.0  --  37.3*  --   --   --   --   --  35.6*  --   --   PLT 222  --   --  179  --   --   --   --   --  184  --   --   APTT  --   --   --   --   --   --    < > 35 32  --  32 32  HEPARINUNFRC  --   --   --   --   --   --   --  >2.20* 1.74*  --   --   --   CREATININE 1.94*  --   --  1.53*  --   --   --   --   --  1.65*  --   --   TROPONINIHS 45*  --    < >  --  52* 31*  --  32*  --   --   --   --    < > = values in this interval not displayed.    Estimated Creatinine Clearance: 47.5 mL/min (A) (by C-G formula based on SCr of 1.65 mg/dL (H)).   Medical History: Past Medical History:  Diagnosis Date  . Atrial fibrillation ([place])   . CHF (congestive heart failure) ([place])   . Hepatitis C   . Hypertension   . Stroke ([place])   . Visit for monitoring Tikosyn therapy 03/26/2017    Assessment: 44 YOM on Apixaban PTA for hx Afib resumed this admission and now with EKG changes concerning for ischemia. Plans are to d/c Apixaban and initiate a Heparin bridge while awaiting ACS work-up.   The patient's last Apixaban dose was earlier today at 1000 - will check a baseline aPTT/HL this afternoon but would expect for HL to be falsely elevated, so will utilize aPTT values for  initial monitoring.   9/12 AM update:  APTT is low this AM Using aPTT for now due to apixaban influence on heparin levels  No issues per RN  Goal of Therapy:  Heparin level 0.3-0.7 units/ml aPTT 66-102 seconds Monitor platelets by anticoagulation protocol: Yes   Plan:  -Inc heparin to 2100 units/hr -Check heparin level and aPTT at [place], PharmD, [place] Pharmacist Phone: [phone]

## 2018-11-01 NOTE — Progress Notes (Signed)
[facility] for Apixaban >> Heparin Indication: atrial fibrillation, new concern for ACS  Allergies  Allergen Reactions  . Benadryl [Diphenhydramine] Palpitations    Patient Measurements: Height: 5\' 7"  (170.2 cm) Weight: 205 lb 12.8 oz (93.4 kg)(scale a) IBW/kg (Calculated) : 66.1 Heparin Dosing Weight: 85.3 kg  Vital Signs: Temp: 97.3 F (36.3 C) (09/12 0504) Temp Source: Oral (09/12 0504) BP: 127/78 (09/12 1010) Pulse Rate: 67 (09/12 1010)  Labs: Recent Labs    10/29/18 2155 10/29/18 2303  10/30/18 0338 10/30/18 0339 10/30/18 1649  10/30/18 1826 10/31/18 0407 10/31/18 0819 10/31/18 1221 10/31/18 2310 11/01/18 0552 11/01/18 0938  HGB 14.2 14.3  --  12.2*  --   --   --   --   --  11.8*  --   --   --   --   HCT 44.8 42.0  --  37.3*  --   --   --   --   --  35.6*  --   --   --   --   PLT 222  --   --  179  --   --   --   --   --  184  --   --   --   --   APTT  --   --   --   --   --   --    < > 35 32  --  32 32  --  32  HEPARINUNFRC  --   --   --   --   --   --   --  >2.20* 1.74*  --   --   --   --  0.46  CREATININE 1.94*  --   --  1.53*  --   --   --   --   --  1.65*  --   --  1.45*  --   TROPONINIHS 45*  --    < >  --  52* 31*  --  32*  --   --   --   --   --   --    < > = values in this interval not displayed.    Estimated Creatinine Clearance: 54.6 mL/min (A) (by C-G formula based on SCr of 1.45 mg/dL (H)).   Medical History: Past Medical History:  Diagnosis Date  . Atrial fibrillation ([place])   . CHF (congestive heart failure) ([place])   . Hepatitis C   . Hypertension   . Stroke ([place])   . Visit for monitoring Tikosyn therapy 03/26/2017    Assessment: 89 YOM on Apixaban PTA for hx Afib resumed this admission and now with EKG changes concerning for ischemia. Apixaban discontinued and Heparin initiated while awaiting ACS work-up.   The patient's last Apixaban dose was 09/10 at 1000. APTT this morning remains 32  (subtherapuetic) despite increasing heparin rate. Heparin levels still appears to be falsely elevated, so will utilize aPTT values for monitoring. No problems with infusion or bleeding per RN. Hg is stable at 11.8. Plts are wnls  Goal of Therapy:  Heparin level 0.3-0.7 units/ml aPTT 66-102 seconds Monitor platelets by anticoagulation protocol: Yes   Plan:   -Inc heparin to 2400 units/hr - Check aPTT at 1730 - Monitor daily heparin level and aPTT until both levels corrolate - Monitor CBC daily - Watch for signs/symptoms of bleeding  Sherren Kerns, PharmD PGY1 Acute Care Pharmacy Resident [phone]

## 2018-11-01 NOTE — Progress Notes (Signed)
Progress Note  Patient Name: Glenn [name] Date of Encounter: 11/01/2018  Primary Cardiologist:   Virl Axe, MD   Subjective   Denies any chest pain or SOB  Inpatient Medications    Scheduled Meds: . carvedilol  25 mg Oral BID WC  . gabapentin  300 mg Oral TID  . polyethylene glycol  17 g Oral Daily  . potassium chloride SA  20 mEq Oral Daily  . vitamin B-12  500 mcg Oral Daily  . vitamin C  500 mg Oral Daily   Continuous Infusions: . heparin 2,100 Units/hr (11/01/18 0500)   PRN Meds: acetaminophen **OR** acetaminophen, albuterol, diazepam, ipratropium-albuterol   Vital Signs    Vitals:   11/01/18 0000 11/01/18 0120 11/01/18 0504 11/01/18 1010  BP:   (!) 113/98 127/78  Pulse: 78 68 71 67  Resp:   14   Temp:   (!) 97.3 F (36.3 C)   TempSrc:   Oral   SpO2:   96%   Weight:   93.4 kg   Height:        Intake/Output Summary (Last 24 hours) at 11/01/2018 1112 Last data filed at 11/01/2018 0900 Gross per 24 hour  Intake 342 ml  Output 705 ml  Net -363 ml   Filed Weights   10/30/18 1000 10/31/18 0640 11/01/18 0504  Weight: 91.7 kg 91.7 kg 93.4 kg    Telemetry    NSR - Personally Reviewed  ECG  NSR with T wave inversionsi n V4-V6  QTc 427msec today - Personally Reviewed  Physical Exam   GEN: Well nourished, well developed in no acute distress HEENT: Normal NECK: No JVD; No carotid bruits LYMPHATICS: No lymphadenopathy CARDIAC:RRR, no murmurs, rubs, gallops RESPIRATORY:  Clear to auscultation without rales, wheezing or rhonchi  ABDOMEN: Soft, non-tender, non-distended MUSCULOSKELETAL:  No edema; No deformity  SKIN: Warm and dry NEUROLOGIC:  Alert and oriented x 3 PSYCHIATRIC:  Normal affect    Labs    Chemistry Recent Labs  Lab 10/29/18 2155  10/30/18 0338 10/31/18 0819 11/01/18 0552  NA 138   < > 139 139 141  K 4.4   < > 4.4 4.4 4.0  CL 104  --  105 101 104  CO2 26  --  24 28 27   GLUCOSE 167*  --  134* 160* 101*  BUN 16  --   19 25* 24*  CREATININE 1.94*  --  1.53* 1.65* 1.45*  CALCIUM 9.1  --  8.9 9.1 8.9  PROT 7.9  --   --   --   --   ALBUMIN 4.3  --   --   --   --   AST 31  --   --   --   --   ALT 27  --   --   --   --   ALKPHOS 100  --   --   --   --   BILITOT 1.1  --   --   --   --   GFRNONAA 35*  --  47* 43* 50*  GFRAA 41*  --  54* 49* 58*  ANIONGAP 8  --  10 10 10    < > = values in this interval not displayed.     Hematology Recent Labs  Lab 10/29/18 2155 10/29/18 2303 10/30/18 0338 10/31/18 0819  WBC 7.1  --  6.3 8.4  RBC 4.41  --  3.84* 3.64*  HGB 14.2 14.3 12.2* 11.8*  HCT 44.8 42.0  37.3* 35.6*  MCV 101.6*  --  97.1 97.8  MCH 32.2  --  31.8 32.4  MCHC 31.7  --  32.7 33.1  RDW 14.0  --  13.5 13.7  PLT 222  --  179 184    Cardiac EnzymesNo results for input(s): TROPONINI in the last 168 hours. No results for input(s): TROPIPOC in the last 168 hours.   BNP Recent Labs  Lab 10/29/18 2155  BNP 1,167.1*     DDimer No results for input(s): DDIMER in the last 168 hours.   Radiology    No results found.  Cardiac Studies   ECHO   1. The left ventricle has severely reduced systolic function, with an ejection fraction of 15-20%. The cavity size was moderately dilated. There is moderate concentric left ventricular hypertrophy. Left ventricular diastolic function could not be evaluated secondary to atrial fibrillation. Left ventricular diffuse hypokinesis. 2. The right ventricle has moderately reduced systolic function. The cavity was moderately enlarged. There is no increase in right ventricular wall thickness. Right ventricular systolic pressure is moderately elevated with an estimated pressure of 45.5 mmHg. 3. Left atrial size was moderately dilated. 4. Right atrial size was severely dilated. 5. Tricuspid valve regurgitation is severe. 6. Mild thickening of the aortic valve. Aortic valve regurgitation is moderate by color flow Doppler. 7. The aorta is normal unless otherwise  noted. Patient Profile     68 y.o. male y.o. male with a hx of Afib with ICD, Chronic systolic Heart failure (EF 30-35% 2018), NICM (reported nonobstructive CAD on cath in [place]), noncompliance, hepatitis C, hypertension, h/o stroke, tobacco abuse, h/o of cocaine use who is being seen for the evaluation of heart failure at the request of Dr. Owens Shark.  Assessment & Plan    CARDIOMYOPATHY with chronic systolic CHF -echo now with EF decline from 30-25% down to 15%     -reported nonobstructive dx on cath in the past in [place] -denies any DOB today -Fair diuresis yesterday with 905cc out.  He is neg negative 2.4L. -diuretics on hold due to increased creatinine and need for cath on Mondy -Cardizem stopped -Trop is not diagnostic of an acute coronary syndrome.    -TSH normal -no ARB for now due to CKD and need for cath but should be started on ARB or Entresto at discharge if renal function stable post cath -plan for right and left heart cath Monday  CKD:     -diuretics on hold over weekend for cath on Monday -creatinine today down from 1.65>1.45  ATRIAL FIB   -Holding Tikosyn with prolonged QT.     -QTc has improved on EKG today -Currently maintaining NSR.   -Eliquis on hold for cath -Continue IV Heparin gtt  HTN -BP is controlled -continue Carvedilol 25mg  BID  Elevated troponin -mildly elevated at 31>32 -likely related to CHF in the setting of CKD -had non obstructive CAD by cath remotely in [place] for right and left cath on monday   For questions or updates, please contact [place] Please consult [email or web address] for contact info under Cardiology/STEMI.   Signed, Fransico Him, MD  11/01/2018, 11:12 AM

## 2018-11-02 LAB — RENAL FUNCTION PANEL
Albumin: 3.7 g/dL (ref 3.5–5.0)
Anion gap: 10 (ref 5–15)
BUN: 18 mg/dL (ref 8–23)
CO2: 25 mmol/L (ref 22–32)
Calcium: 9 mg/dL (ref 8.9–10.3)
Chloride: 106 mmol/L (ref 98–111)
Creatinine, Ser: 1.27 mg/dL — ABNORMAL HIGH (ref 0.61–1.24)
GFR calc Af Amer: >60 mL/min (ref >60)
GFR calc non Af Amer: 58 mL/min — ABNORMAL LOW (ref >60)
Glucose, Bld: 113 mg/dL — ABNORMAL HIGH (ref 70–99)
Phosphorus: 3.6 mg/dL (ref 2.5–4.6)
Potassium: 4.4 mmol/L (ref 3.5–5.1)
Sodium: 141 mmol/L (ref 135–145)

## 2018-11-02 MED ORDER — SODIUM CHLORIDE 0.9 % IV SOLN: 250.0000 mL | INTRAVENOUS | Status: DC | PRN

## 2018-11-02 MED ORDER — SODIUM CHLORIDE 0.9 % IV SOLN
INTRAVENOUS | Status: DC
  Administered 2018-11-03: 05:00:00 via INTRAVENOUS

## 2018-11-02 MED ORDER — ASPIRIN 81 MG PO CHEW
81.0000 mg | CHEWABLE_TABLET | ORAL | Status: AC
  Administered 2018-11-03: 81 mg via ORAL
  Filled 2018-11-02: qty 1

## 2018-11-02 MED ORDER — SODIUM CHLORIDE 0.9% FLUSH
3.0000 mL | Freq: Two times a day (BID) | INTRAVENOUS | Status: DC
  Administered 2018-11-02 – 2018-11-05 (×6): 3 mL via INTRAVENOUS

## 2018-11-02 MED ORDER — HEPARIN (PORCINE) 25000 UT/250ML-% IV SOLN
2700.0000 [IU]/h | INTRAVENOUS | Status: DC
  Administered 2018-11-02 – 2018-11-03 (×2): 2700 [IU]/h via INTRAVENOUS
  Filled 2018-11-02: qty 250

## 2018-11-02 MED ORDER — SODIUM CHLORIDE 0.9% FLUSH: 3.0000 mL | INTRAVENOUS | Status: DC | PRN

## 2018-11-02 NOTE — H&P (View-Only) (Signed)
Progress Note  Patient Name: Glenn [name] Date of Encounter: 11/02/2018  Primary Cardiologist:   Virl Axe, MD   Subjective   No CP or SOB  Inpatient Medications    Scheduled Meds: . carvedilol  25 mg Oral BID WC  . enoxaparin (LOVENOX) injection  90 mg Subcutaneous Q12H  . gabapentin  300 mg Oral TID  . polyethylene glycol  17 g Oral Daily  . potassium chloride SA  20 mEq Oral Daily  . vitamin B-12  500 mcg Oral Daily  . vitamin C  500 mg Oral Daily   Continuous Infusions:  PRN Meds: acetaminophen **OR** acetaminophen, albuterol, diazepam, ipratropium-albuterol   Vital Signs    Vitals:   11/01/18 1425 11/01/18 1649 11/01/18 2007 11/02/18 0440  BP: (!) 92/59 124/74 105/67 104/70  Pulse: 61 67 (!) 59 69  Resp: 18  14 14   Temp:   97.9 F (36.6 C) 98 F (36.7 C)  TempSrc:   Oral Oral  SpO2:   98% 94%  Weight:    92.9 kg  Height:        Intake/Output Summary (Last 24 hours) at 11/02/2018 [id number] Last data filed at 11/02/2018 0600 Gross per 24 hour  Intake 915.73 ml  Output 101 ml  Net 814.73 ml   Filed Weights   10/31/18 0640 11/01/18 0504 11/02/18 0440  Weight: 91.7 kg 93.4 kg 92.9 kg    Telemetry    NSR- Personally Reviewed  ECG  No new EKG to review - Personally Reviewed  Physical Exam   GEN: Well nourished, well developed in no acute distress HEENT: Normal NECK: No JVD; No carotid bruits LYMPHATICS: No lymphadenopathy CARDIAC:RRR, no murmurs, rubs, gallops RESPIRATORY:  Clear to auscultation without rales, wheezing or rhonchi  ABDOMEN: Soft, non-tender, non-distended MUSCULOSKELETAL:  No edema; No deformity  SKIN: Warm and dry NEUROLOGIC:  Alert and oriented x 3 PSYCHIATRIC:  Normal affect    Labs    Chemistry Recent Labs  Lab 10/29/18 2155  10/31/18 0819 11/01/18 0552 11/02/18 0503  NA 138   < > 139 141 141  K 4.4   < > 4.4 4.0 4.4  CL 104   < > 101 104 106  CO2 26   < > 28 27 25   GLUCOSE 167*   < > 160* 101* 113*   BUN 16   < > 25* 24* 18  CREATININE 1.94*   < > 1.65* 1.45* 1.27*  CALCIUM 9.1   < > 9.1 8.9 9.0  PROT 7.9  --   --   --   --   ALBUMIN 4.3  --   --   --  3.7  AST 31  --   --   --   --   ALT 27  --   --   --   --   ALKPHOS 100  --   --   --   --   BILITOT 1.1  --   --   --   --   GFRNONAA 35*   < > 43* 50* 58*  GFRAA 41*   < > 49* 58* >60  ANIONGAP 8   < > 10 10 10    < > = values in this interval not displayed.     Hematology Recent Labs  Lab 10/29/18 2155 10/29/18 2303 10/30/18 0338 10/31/18 0819  WBC 7.1  --  6.3 8.4  RBC 4.41  --  3.84* 3.64*  HGB 14.2 14.3 12.2* 11.8*  HCT 44.8 42.0 37.3* 35.6*  MCV 101.6*  --  97.1 97.8  MCH 32.2  --  31.8 32.4  MCHC 31.7  --  32.7 33.1  RDW 14.0  --  13.5 13.7  PLT 222  --  179 184    Cardiac EnzymesNo results for input(s): TROPONINI in the last 168 hours. No results for input(s): TROPIPOC in the last 168 hours.   BNP Recent Labs  Lab 10/29/18 2155  BNP 1,167.1*     DDimer No results for input(s): DDIMER in the last 168 hours.   Radiology    No results found.  Cardiac Studies   ECHO   1. The left ventricle has severely reduced systolic function, with an ejection fraction of 15-20%. The cavity size was moderately dilated. There is moderate concentric left ventricular hypertrophy. Left ventricular diastolic function could not be evaluated secondary to atrial fibrillation. Left ventricular diffuse hypokinesis. 2. The right ventricle has moderately reduced systolic function. The cavity was moderately enlarged. There is no increase in right ventricular wall thickness. Right ventricular systolic pressure is moderately elevated with an estimated pressure of 45.5 mmHg. 3. Left atrial size was moderately dilated. 4. Right atrial size was severely dilated. 5. Tricuspid valve regurgitation is severe. 6. Mild thickening of the aortic valve. Aortic valve regurgitation is moderate by color flow Doppler. 7. The aorta is  normal unless otherwise noted. Patient Profile     68 y.o. male y.o. male with a hx of Afib with ICD, Chronic systolic Heart failure (EF 30-35% 2018), NICM (reported nonobstructive CAD on cath in [place]), noncompliance, hepatitis C, hypertension, h/o stroke, tobacco abuse, h/o of cocaine use who is being seen for the evaluation of heart failure at the request of Dr. Owens Shark.  Assessment & Plan    CARDIOMYOPATHY with chronic systolic CHF -echo now with EF decline from 30-25% down to 15%     -reported nonobstructive dx on cath in the past in [place] -denies any SOB today -I&Os appear incomplete -diuretics on hold due to increased creatinine and need for cath on Monday -Cardizem stopped due to LV dysfunction -Trop is not diagnostic of an acute coronary syndrome.    -TSH normal -creatinine improved to 1.27 with holding diuretics -no ARB for now due to CKD and need for cath but should be started on ARB or Entresto at discharge if renal function stable post cath -continue Carvedilol -plan for right and left heart cath tomorrow -Cardiac catheterization was discussed with the patient fully. The patient understands that risks include but are not limited to stroke (1 in 1000), death (1 in 61), kidney failure [usually temporary] (1 in 500), bleeding (1 in 200), allergic reaction [possibly serious] (1 in 200).  The patient understands and is willing to proceed.    CKD:     -diuretics on hold over weekend for cath on Monday -creatinine today down from 1.65>1.45>1.27  ATRIAL FIB   -Holding Tikosyn with prolonged QT.     -QTc improved on EKG yesterday -repeat EKG today -Currently maintaining NSR.   -Eliquis on hold for cath -Change full dose SQ Lovenox to IV Heparin per pharmacy  HTN -BP is controlled at 104/76mmHg -continue Carvedilol 25mg  BID  Elevated troponin -mildly elevated at 45>55>58>31>32 -likely related to CHF in the setting of CKD -had non obstructive CAD by cath remotely in [place] for  right and left cath on tomorrow -on full dose Lovenox - will change to IV heparin full dose per pharmacy  I have  spent a total of 35 minutes with patient reviewing notes , telemetry, EKGs, labs and examining patient as well as establishing an assessment and plan as well as reviewing cardiac cath risks that was discussed with the patient.  > 50% of time was spent in direct patient care.    For questions or updates, please contact [place] Please consult [email or web address] for contact info under Cardiology/STEMI.   Signed, Fransico Him, MD  11/02/2018, 9:07 AM

## 2018-11-02 NOTE — Progress Notes (Addendum)
Progress Note  Patient Name: Glenn [name] Date of Encounter: 11/02/2018  Primary Cardiologist:   Virl Axe, MD   Subjective   No CP or SOB  Inpatient Medications    Scheduled Meds: . carvedilol  25 mg Oral BID WC  . enoxaparin (LOVENOX) injection  90 mg Subcutaneous Q12H  . gabapentin  300 mg Oral TID  . polyethylene glycol  17 g Oral Daily  . potassium chloride SA  20 mEq Oral Daily  . vitamin B-12  500 mcg Oral Daily  . vitamin C  500 mg Oral Daily   Continuous Infusions:  PRN Meds: acetaminophen **OR** acetaminophen, albuterol, diazepam, ipratropium-albuterol   Vital Signs    Vitals:   11/01/18 1425 11/01/18 1649 11/01/18 2007 11/02/18 0440  BP: (!) 92/59 124/74 105/67 104/70  Pulse: 61 67 (!) 59 69  Resp: 18  14 14   Temp:   97.9 F (36.6 C) 98 F (36.7 C)  TempSrc:   Oral Oral  SpO2:   98% 94%  Weight:    92.9 kg  Height:        Intake/Output Summary (Last 24 hours) at 11/02/2018 [id number] Last data filed at 11/02/2018 0600 Gross per 24 hour  Intake 915.73 ml  Output 101 ml  Net 814.73 ml   Filed Weights   10/31/18 0640 11/01/18 0504 11/02/18 0440  Weight: 91.7 kg 93.4 kg 92.9 kg    Telemetry    NSR- Personally Reviewed  ECG  No new EKG to review - Personally Reviewed  Physical Exam   GEN: Well nourished, well developed in no acute distress HEENT: Normal NECK: No JVD; No carotid bruits LYMPHATICS: No lymphadenopathy CARDIAC:RRR, no murmurs, rubs, gallops RESPIRATORY:  Clear to auscultation without rales, wheezing or rhonchi  ABDOMEN: Soft, non-tender, non-distended MUSCULOSKELETAL:  No edema; No deformity  SKIN: Warm and dry NEUROLOGIC:  Alert and oriented x 3 PSYCHIATRIC:  Normal affect    Labs    Chemistry Recent Labs  Lab 10/29/18 2155  10/31/18 0819 11/01/18 0552 11/02/18 0503  NA 138   < > 139 141 141  K 4.4   < > 4.4 4.0 4.4  CL 104   < > 101 104 106  CO2 26   < > 28 27 25   GLUCOSE 167*   < > 160* 101* 113*   BUN 16   < > 25* 24* 18  CREATININE 1.94*   < > 1.65* 1.45* 1.27*  CALCIUM 9.1   < > 9.1 8.9 9.0  PROT 7.9  --   --   --   --   ALBUMIN 4.3  --   --   --  3.7  AST 31  --   --   --   --   ALT 27  --   --   --   --   ALKPHOS 100  --   --   --   --   BILITOT 1.1  --   --   --   --   GFRNONAA 35*   < > 43* 50* 58*  GFRAA 41*   < > 49* 58* >60  ANIONGAP 8   < > 10 10 10    < > = values in this interval not displayed.     Hematology Recent Labs  Lab 10/29/18 2155 10/29/18 2303 10/30/18 0338 10/31/18 0819  WBC 7.1  --  6.3 8.4  RBC 4.41  --  3.84* 3.64*  HGB 14.2 14.3 12.2* 11.8*  HCT 44.8 42.0 37.3* 35.6*  MCV 101.6*  --  97.1 97.8  MCH 32.2  --  31.8 32.4  MCHC 31.7  --  32.7 33.1  RDW 14.0  --  13.5 13.7  PLT 222  --  179 184    Cardiac EnzymesNo results for input(s): TROPONINI in the last 168 hours. No results for input(s): TROPIPOC in the last 168 hours.   BNP Recent Labs  Lab 10/29/18 2155  BNP 1,167.1*     DDimer No results for input(s): DDIMER in the last 168 hours.   Radiology    No results found.  Cardiac Studies   ECHO   1. The left ventricle has severely reduced systolic function, with an ejection fraction of 15-20%. The cavity size was moderately dilated. There is moderate concentric left ventricular hypertrophy. Left ventricular diastolic function could not be evaluated secondary to atrial fibrillation. Left ventricular diffuse hypokinesis. 2. The right ventricle has moderately reduced systolic function. The cavity was moderately enlarged. There is no increase in right ventricular wall thickness. Right ventricular systolic pressure is moderately elevated with an estimated pressure of 45.5 mmHg. 3. Left atrial size was moderately dilated. 4. Right atrial size was severely dilated. 5. Tricuspid valve regurgitation is severe. 6. Mild thickening of the aortic valve. Aortic valve regurgitation is moderate by color flow Doppler. 7. The aorta is  normal unless otherwise noted. Patient Profile     68 y.o. male y.o. male with a hx of Afib with ICD, Chronic systolic Heart failure (EF 30-35% 2018), NICM (reported nonobstructive CAD on cath in [place]), noncompliance, hepatitis C, hypertension, h/o stroke, tobacco abuse, h/o of cocaine use who is being seen for the evaluation of heart failure at the request of Dr. Owens Shark.  Assessment & Plan    CARDIOMYOPATHY with chronic systolic CHF -echo now with EF decline from 30-25% down to 15%     -reported nonobstructive dx on cath in the past in [place] -denies any SOB today -I&Os appear incomplete -diuretics on hold due to increased creatinine and need for cath on Monday -Cardizem stopped due to LV dysfunction -Trop is not diagnostic of an acute coronary syndrome.    -TSH normal -creatinine improved to 1.27 with holding diuretics -no ARB for now due to CKD and need for cath but should be started on ARB or Entresto at discharge if renal function stable post cath -continue Carvedilol -plan for right and left heart cath tomorrow -Cardiac catheterization was discussed with the patient fully. The patient understands that risks include but are not limited to stroke (1 in 1000), death (1 in 25), kidney failure [usually temporary] (1 in 500), bleeding (1 in 200), allergic reaction [possibly serious] (1 in 200).  The patient understands and is willing to proceed.    CKD:     -diuretics on hold over weekend for cath on Monday -creatinine today down from 1.65>1.45>1.27  ATRIAL FIB   -Holding Tikosyn with prolonged QT.     -QTc improved on EKG yesterday -repeat EKG today -Currently maintaining NSR.   -Eliquis on hold for cath -Change full dose SQ Lovenox to IV Heparin per pharmacy  HTN -BP is controlled at 104/12mmHg -continue Carvedilol 25mg  BID  Elevated troponin -mildly elevated at 45>55>58>31>32 -likely related to CHF in the setting of CKD -had non obstructive CAD by cath remotely in [place] for  right and left cath on tomorrow -on full dose Lovenox - will change to IV heparin full dose per pharmacy  I have  spent a total of 35 minutes with patient reviewing notes , telemetry, EKGs, labs and examining patient as well as establishing an assessment and plan as well as reviewing cardiac cath risks that was discussed with the patient.  > 50% of time was spent in direct patient care.    For questions or updates, please contact [place] Please consult [email or web address] for contact info under Cardiology/STEMI.   Signed, Fransico Him, MD  11/02/2018, 9:07 AM

## 2018-11-02 NOTE — Plan of Care (Signed)
  Problem: Clinical Measurements: Goal: Will remain free from infection Outcome: Completed/Met   Problem: Coping: Goal: Level of anxiety will decrease Outcome: Completed/Met   Problem: Elimination: Goal: Will not experience complications related to bowel motility Outcome: Completed/Met Goal: Will not experience complications related to urinary retention Outcome: Completed/Met   Problem: Pain Managment: Goal: General experience of comfort will improve Outcome: Completed/Met   Problem: Safety: Goal: Ability to remain free from injury will improve Outcome: Completed/Met   Problem: Skin Integrity: Goal: Risk for impaired skin integrity will decrease Outcome: Completed/Met

## 2018-11-02 NOTE — Progress Notes (Signed)
[place] for Apixaban >> Heparin Indication: atrial fibrillation, new concern for ACS  Allergies  Allergen Reactions  . Benadryl [Diphenhydramine] Palpitations    Patient Measurements: Height: 5\' 7"  (170.2 cm) Weight: 204 lb 14.4 oz (92.9 kg)(scale a ) IBW/kg (Calculated) : 66.1 Heparin Dosing Weight: 85.3 kg  Vital Signs: Temp: 98 F (36.7 C) (09/13 0440) Temp Source: Oral (09/13 0440) BP: 104/70 (09/13 0440) Pulse Rate: 69 (09/13 0440)  Labs: Recent Labs    10/30/18 1649  10/30/18 1826 10/31/18 0407 10/31/18 [id number]  10/31/18 2310 11/01/18 0552 11/01/18 0938 11/01/18 1814 11/02/18 0503  HGB  --   --   --   --  11.8*  --   --   --   --   --   --   HCT  --   --   --   --  35.6*  --   --   --   --   --   --   PLT  --   --   --   --  184  --   --   --   --   --   --   APTT  --    < > 35 32  --    < > 32  --  32 35  --   HEPARINUNFRC  --   --  >2.20* 1.74*  --   --   --   --  0.46  --   --   CREATININE  --   --   --   --  1.65*  --   --  1.45*  --   --  1.27*  TROPONINIHS 31*  --  32*  --   --   --   --   --   --   --   --    < > = values in this interval not displayed.    Estimated Creatinine Clearance: 62.2 mL/min (A) (by C-G formula based on SCr of 1.27 mg/dL (H)).   Medical History: Past Medical History:  Diagnosis Date  . Atrial fibrillation ([place])   . CHF (congestive heart failure) ([place])   . Hepatitis C   . Hypertension   . Stroke ([place])   . Visit for monitoring Tikosyn therapy 03/26/2017    Assessment: 89 YOM on Apixaban PTA for hx Afib resumed this admission and now with EKG changes concerning for ischemia. Apixaban discontinued and Heparin initiated while awaiting ACS work-up.   The patient was switched to lovenox due to heparin resistance. However, due to plan for cath tomorrow, plan is to switch back to heparin. Heparin level may still be falsely elevated from previous apixaban dosing.  Hg is stable at 11.8. Plts  are wnls  Goal of Therapy:  Heparin level 0.3-0.7 units/ml aPTT 66-102 seconds Monitor platelets by anticoagulation protocol: Yes   Plan:   -Start heparin infusion at 2700 units/hr 12 hours after last lovenox dose. - Check aPTT and HL with AM labs - Monitor daily heparin level and aPTT until both levels corrolate - Monitor CBC daily - Watch for signs/symptoms of bleeding  Sherren Kerns, PharmD PGY1 Acute Care Pharmacy Resident [phone]

## 2018-11-02 NOTE — Progress Notes (Signed)
Plan for right and left heart catheterization tomorrow 11/02/18. Will hold lovenox for cath tomorrow.   Discussed with Dr. Radford Pax - switch to heparin gtt.

## 2018-11-02 NOTE — Progress Notes (Signed)
FMTS Attending Daily Note: Glenn Singh, MD  Team Pager [phone] Pager [phone]  I have seen and examined this patient, reviewed their chart. I have discussed this patient with the resident. I agree with the resident's findings, assessment and care plan. Creatinine improved, appreciate Cardiology recommendations. Awaiting catheterization on Monday.   Family Medicine Teaching Service Daily Progress Note Intern Pager: [phone]  Patient name: Glenn [name] Medical record number: [id number] Date of birth: [birth year] Age: 68 y.o. Gender: male  Primary Care Provider: [name], MD Consultants: Cardiology Code Status: Full  Pt Overview and Major Events to Date:  9/10- admitted for acute hypoxemic respiratory failure after cardiovesions 9/10- Inverted T waves concerning for acute ischemia -> heparin gtt 9/12- aPTT subtherapeutic switched to enoxaparin 9/13- Cardiology requested switch back to heparin gtt   Assessment and Plan: Glenn [name] is a 68 y.o. male presenting with respiratory distress, now c/f NSTEMI. PMH is significant for AFIB, NICM with ICD, HTN, left MCA CVA, Hep C, hx of cocaine and tobacco abuse.  Cardiomyopathy with HFreF (EF 15-20%)  Nonsustained V. Tach Continues to have intermittent episodes of nonsustained v-tach. Asymptomatic the entire time. EKG this AM with atrial fibrillation, no t-wave changes, QTc WNL. Cardiology following, R/L heart cath planned for 9/14.  Transition from heparin to lovenox yesterday given subtherapeutic aPTT - transition back today in preparation of cath tomorrow. Electrolytes stable. - cardiology consulted, appreciate recs - NPO after MN - continue to hold Tikosyn (prolonged QT initially), Eliquis, Cardizem (LV dysfunction) and diuretics in prep for cath on 9/14 -  Hold Lovenox, continue heparin dtt - plan to start ARB or Entresto at discharge if renal function stable - daily Mag and K - goal K4 and Mag 2  Atrial fibrillation  with RVR s/p cardioversion on 9/9: EKG this AM with atrial fibrillation, HR 86. QTC today WNL.   QTC today manually calculated to be 438 ms. QTc on 9/10 >600, tikosyn discontinued. Occasional VT.  - Cardiology consulted, appreciate recs - continue to hold Tikosyn (prolonged QT initially), Eliquis, Cardizem (LV dysfunction) and diuretics in prep for cath on 9/144 - continue home Carvedilol  -  R/L heart cath 9/14 - continue heparin drip  Acute CHF exacerbation-improved: Appears euvolemic on exam today. Denies any SOB.  Diuretics held in preparation for heart cath 9/14. Cr improved 1.45>1.27. ~126mL UOP with 2 unmeasured. Wt slightly down today: 93.4>92.9kg.    -STRICT I/Os, daily weights -Hold diuresis for heart cath on Monday  Acute kidney injury: improving Creatinine downtrending 1.45>1.27. Baseline 1.1-1.2. Diuretics held due to heart cath 9/14 - Avoid IVF - Avoid nephrotoxic agents - daily BMPs  - will consider diuresis as creatinine continues to improve based on symptoms and volume status  Hypertension BP normotensive overnight, 127/93 this AM.  Home medication is Coreg 25 mg twice daily.   -Continue home medicications -Monitor blood pressures -We will consider statin prior to discharge  Constipation -continue miralax  Tobacco use disorder Patient endorses smoking 0.5 packs/day for the past 30-40 years.  -Encourage smoking cessation -Nicotine patch if needed  H/o CVA left MCA Stable. Patient had a stroke on 09/05/2016. No residual deficits.  Patient underwent chemical thrombectomy for left M2 occlusion.  Appears the patient previously was on aspirin 81 mg daily, however not recently on this. Unclear patient should be on this. Will await cardiology recommendations.  H/o cocaine use disorder Patient with history of cocaine use. Possible contribution to acute SOB and heart failure. -UDS positive for  benzodiazepines - encourage cessation   Chronic Hep C s/p  treatment Patient diagnosed in March 2019 likely due to history of drug use in the 1980s.  There is no personal or family history of liver disease.  Patient received treatment for hep C in April 2019  FEN/GI: heart healthy diet PPx: heparin  Disposition: Awaiting heart cath on Monday  Subjective:  Patient has no complaints this morning. Reports his breathing is at baseline.   Objective: Temp:  [97.4 F (36.3 C)-98 F (36.7 C)] 97.4 F (36.3 C) (09/13 1137) Pulse Rate:  [59-106] 106 (09/13 1137) Resp:  [14-18] 18 (09/13 1137) BP: (104-127)/(67-93) 127/93 (09/13 1137) SpO2:  [94 %-100 %] 100 % (09/13 1137) Weight:  [92.9 kg] 92.9 kg (09/13 0440) Physical Exam: General: well nourished, well developed, in no acute distress with non-toxic appearance HEENT: normocephalic, atraumatic, moist mucous membranes Neck: supple, non-tender without lymphadenopathy CV: irregular rhythm, normal rate, no rubs or gallops, difficult to appreciate murmur this AM due to rhythm, position of patient (laying in bed trying to rest)  Lungs: clear to auscultation bilaterally with normal work of breathing Abdomen: soft, non-tender, non-distended, no masses or organomegaly palpable, normoactive bowel sounds Skin: warm, dry, no rashes or lesions Extremities: warm and well perfused, normal tone     Laboratory: Recent Labs  Lab 10/29/18 2155 10/29/18 2303 10/30/18 0338 10/31/18 0819  WBC 7.1  --  6.3 8.4  HGB 14.2 14.3 12.2* 11.8*  HCT 44.8 42.0 37.3* 35.6*  PLT 222  --  179 184   Recent Labs  Lab 10/29/18 2155  10/31/18 0819 11/01/18 0552 11/02/18 0503  NA 138   < > 139 141 141  K 4.4   < > 4.4 4.0 4.4  CL 104   < > 101 104 106  CO2 26   < > 28 27 25   BUN 16   < > 25* 24* 18  CREATININE 1.94*   < > 1.65* 1.45* 1.27*  CALCIUM 9.1   < > 9.1 8.9 9.0  PROT 7.9  --   --   --   --   BILITOT 1.1  --   --   --   --   ALKPHOS 100  --   --   --   --   ALT 27  --   --   --   --   AST 31  --   --    --   --   GLUCOSE 167*   < > 160* 101* 113*   < > = values in this interval not displayed.    Imaging/Diagnostic Tests: Mina Marble DO PGY-2, [place] Intern pager: [phone], text pages welcome

## 2018-11-03 ENCOUNTER — Encounter (HOSPITAL_COMMUNITY)
Admission: EM | Disposition: A | Discharge status: Home / Self Care | Payer: Self-pay | Source: Home / Self Care | Attending: Family Medicine

## 2018-11-03 ENCOUNTER — Encounter (HOSPITAL_COMMUNITY): Payer: Self-pay | Admitting: Cardiovascular Disease

## 2018-11-03 DIAGNOSIS — R778 Other specified abnormalities of plasma proteins: Secondary | ICD-10-CM

## 2018-11-03 DIAGNOSIS — I5023 Acute on chronic systolic (congestive) heart failure: Secondary | ICD-10-CM

## 2018-11-03 DIAGNOSIS — I4819 Other persistent atrial fibrillation: Secondary | ICD-10-CM

## 2018-11-03 DIAGNOSIS — R9431 Abnormal electrocardiogram [ECG] [EKG]: Secondary | ICD-10-CM

## 2018-11-03 HISTORY — PX: RIGHT/LEFT HEART CATH AND CORONARY ANGIOGRAPHY: CATH118266

## 2018-11-03 LAB — POCT I-STAT 7, (LYTES, BLD GAS, ICA,H+H)
Acid-base deficit: 2 mmol/L (ref 0.0–2.0)
Bicarbonate: 24.1 mmol/L (ref 20.0–28.0)
Calcium, Ion: 1.24 mmol/L (ref 1.15–1.40)
HCT: 36 % — ABNORMAL LOW (ref 39.0–52.0)
Hemoglobin: 12.2 g/dL — ABNORMAL LOW (ref 13.0–17.0)
O2 Saturation: 93 %
Potassium: 4.4 mmol/L (ref 3.5–5.1)
Sodium: 137 mmol/L (ref 135–145)
TCO2: 25 mmol/L (ref 22–32)
pCO2 arterial: 43 mmHg (ref 32.0–48.0)
pH, Arterial: 7.356 (ref 7.350–7.450)
pO2, Arterial: 70 mmHg — ABNORMAL LOW (ref 83.0–108.0)

## 2018-11-03 LAB — POCT I-STAT EG7
Acid-Base Excess: 1 mmol/L (ref 0.0–2.0)
Bicarbonate: 26.7 mmol/L (ref 20.0–28.0)
Bicarbonate: 27.2 mmol/L (ref 20.0–28.0)
Calcium, Ion: 1.27 mmol/L (ref 1.15–1.40)
Calcium, Ion: 1.27 mmol/L (ref 1.15–1.40)
HCT: 38 % — ABNORMAL LOW (ref 39.0–52.0)
HCT: 38 % — ABNORMAL LOW (ref 39.0–52.0)
Hemoglobin: 12.9 g/dL — ABNORMAL LOW (ref 13.0–17.0)
Hemoglobin: 12.9 g/dL — ABNORMAL LOW (ref 13.0–17.0)
O2 Saturation: 51 %
O2 Saturation: 54 %
Potassium: 4.6 mmol/L (ref 3.5–5.1)
Potassium: 4.6 mmol/L (ref 3.5–5.1)
Sodium: 142 mmol/L (ref 135–145)
Sodium: 144 mmol/L (ref 135–145)
TCO2: 28 mmol/L (ref 22–32)
TCO2: 29 mmol/L (ref 22–32)
pCO2, Ven: 48.3 mmHg (ref 44.0–60.0)
pCO2, Ven: 49.1 mmHg (ref 44.0–60.0)
pH, Ven: 7.35 (ref 7.250–7.430)
pH, Ven: 7.351 (ref 7.250–7.430)
pO2, Ven: 29 mmHg — CL (ref 32.0–45.0)
pO2, Ven: 30 mmHg — CL (ref 32.0–45.0)

## 2018-11-03 LAB — BASIC METABOLIC PANEL
Anion gap: 10 (ref 5–15)
BUN: 16 mg/dL (ref 8–23)
CO2: 23 mmol/L (ref 22–32)
Calcium: 8.9 mg/dL (ref 8.9–10.3)
Chloride: 107 mmol/L (ref 98–111)
Creatinine, Ser: 1.23 mg/dL (ref 0.61–1.24)
GFR calc Af Amer: >60 mL/min (ref >60)
GFR calc non Af Amer: >60 mL/min (ref >60)
Glucose, Bld: 93 mg/dL (ref 70–99)
Potassium: 4.5 mmol/L (ref 3.5–5.1)
Sodium: 140 mmol/L (ref 135–145)

## 2018-11-03 LAB — CBC
HCT: 39.2 % (ref 39.0–52.0)
Hemoglobin: 12.7 g/dL — ABNORMAL LOW (ref 13.0–17.0)
MCH: 31.8 pg (ref 26.0–34.0)
MCHC: 32.4 g/dL (ref 30.0–36.0)
MCV: 98 fL (ref 80.0–100.0)
Platelets: 182 10*3/uL (ref 150–400)
RBC: 4 MIL/uL — ABNORMAL LOW (ref 4.22–5.81)
RDW: 13.5 % (ref 11.5–15.5)
WBC: 4.6 10*3/uL (ref 4.0–10.5)
nRBC: 0 % (ref 0.0–0.2)

## 2018-11-03 LAB — MAGNESIUM: Magnesium: 1.9 mg/dL (ref 1.7–2.4)

## 2018-11-03 LAB — APTT: aPTT: 51 seconds — ABNORMAL HIGH (ref 24–36)

## 2018-11-03 LAB — HEPARIN LEVEL (UNFRACTIONATED): Heparin Unfractionated: 0.6 IU/mL (ref 0.30–0.70)

## 2018-11-03 SURGERY — RIGHT/LEFT HEART CATH AND CORONARY ANGIOGRAPHY
Anesthesia: LOCAL

## 2018-11-03 MED ORDER — HEPARIN SODIUM (PORCINE) 1000 UNIT/ML IJ SOLN
INTRAMUSCULAR | Status: DC | PRN
  Administered 2018-11-03: 5000 [IU] via INTRAVENOUS

## 2018-11-03 MED ORDER — HEPARIN (PORCINE) IN NACL 1000-0.9 UT/500ML-% IV SOLN
INTRAVENOUS | Status: DC | PRN
  Administered 2018-11-03 (×3): 500 mL

## 2018-11-03 MED ORDER — MIDAZOLAM HCL 2 MG/2ML IJ SOLN
INTRAMUSCULAR | Status: AC
  Filled 2018-11-03: qty 2

## 2018-11-03 MED ORDER — HEPARIN (PORCINE) IN NACL 1000-0.9 UT/500ML-% IV SOLN
INTRAVENOUS | Status: AC
  Filled 2018-11-03: qty 1000

## 2018-11-03 MED ORDER — SODIUM CHLORIDE 0.9% FLUSH
3.0000 mL | Freq: Two times a day (BID) | INTRAVENOUS | Status: DC
  Administered 2018-11-03 – 2018-11-04 (×2): 3 mL via INTRAVENOUS

## 2018-11-03 MED ORDER — SODIUM CHLORIDE 0.9% FLUSH: 3.0000 mL | INTRAVENOUS | Status: DC | PRN

## 2018-11-03 MED ORDER — VERAPAMIL HCL 2.5 MG/ML IV SOLN
INTRA_ARTERIAL | Status: DC | PRN
  Administered 2018-11-03: 5 mL via INTRA_ARTERIAL

## 2018-11-03 MED ORDER — HEPARIN SODIUM (PORCINE) 1000 UNIT/ML IJ SOLN
INTRAMUSCULAR | Status: AC
  Filled 2018-11-03: qty 1

## 2018-11-03 MED ORDER — VERAPAMIL HCL 2.5 MG/ML IV SOLN
INTRAVENOUS | Status: AC
  Filled 2018-11-03: qty 2

## 2018-11-03 MED ORDER — MIDAZOLAM HCL 2 MG/2ML IJ SOLN
INTRAMUSCULAR | Status: DC | PRN
  Administered 2018-11-03: 1 mg via INTRAVENOUS

## 2018-11-03 MED ORDER — FUROSEMIDE 10 MG/ML IJ SOLN
40.0000 mg | Freq: Two times a day (BID) | INTRAMUSCULAR | Status: DC
  Administered 2018-11-03 – 2018-11-04 (×3): 40 mg via INTRAVENOUS
  Filled 2018-11-03 (×3): qty 4

## 2018-11-03 MED ORDER — SODIUM CHLORIDE 0.9 % IV SOLN: INTRAVENOUS | Status: DC

## 2018-11-03 MED ORDER — MORPHINE SULFATE (PF) 2 MG/ML IV SOLN: 2.0000 mg | INTRAVENOUS | Status: DC | PRN

## 2018-11-03 MED ORDER — NITROGLYCERIN 1 MG/10 ML FOR IR/CATH LAB
INTRA_ARTERIAL | Status: AC
  Filled 2018-11-03: qty 10

## 2018-11-03 MED ORDER — FENTANYL CITRATE (PF) 100 MCG/2ML IJ SOLN
INTRAMUSCULAR | Status: AC
  Filled 2018-11-03: qty 2

## 2018-11-03 MED ORDER — LABETALOL HCL 5 MG/ML IV SOLN: 10.0000 mg | INTRAVENOUS | Status: AC | PRN

## 2018-11-03 MED ORDER — ACETAMINOPHEN 325 MG PO TABS: 650.0000 mg | ORAL_TABLET | ORAL | Status: DC | PRN

## 2018-11-03 MED ORDER — LIDOCAINE HCL (PF) 1 % IJ SOLN
INTRAMUSCULAR | Status: DC | PRN
  Administered 2018-11-03 (×2): 2 mL

## 2018-11-03 MED ORDER — SODIUM CHLORIDE 0.9 % IV SOLN: 250.0000 mL | INTRAVENOUS | Status: DC | PRN

## 2018-11-03 MED ORDER — APIXABAN 5 MG PO TABS
5.0000 mg | ORAL_TABLET | Freq: Two times a day (BID) | ORAL | Status: DC
  Administered 2018-11-03 – 2018-11-06 (×6): 5 mg via ORAL
  Filled 2018-11-03 (×6): qty 1

## 2018-11-03 MED ORDER — HEPARIN (PORCINE) IN NACL 1000-0.9 UT/500ML-% IV SOLN
INTRAVENOUS | Status: AC
  Filled 2018-11-03: qty 500

## 2018-11-03 MED ORDER — LIDOCAINE HCL (PF) 1 % IJ SOLN
INTRAMUSCULAR | Status: AC
  Filled 2018-11-03: qty 30

## 2018-11-03 MED ORDER — IOHEXOL 350 MG/ML SOLN
INTRAVENOUS | Status: DC | PRN
  Administered 2018-11-03: 20 mL via INTRA_ARTERIAL

## 2018-11-03 MED ORDER — HYDRALAZINE HCL 20 MG/ML IJ SOLN: 10.0000 mg | INTRAMUSCULAR | Status: AC | PRN

## 2018-11-03 MED ORDER — DOFETILIDE 250 MCG PO CAPS
250.0000 ug | ORAL_CAPSULE | Freq: Two times a day (BID) | ORAL | Status: DC
  Administered 2018-11-03 – 2018-11-06 (×7): 250 ug via ORAL
  Filled 2018-11-03 (×7): qty 1

## 2018-11-03 MED ORDER — ASPIRIN 81 MG PO CHEW
81.0000 mg | CHEWABLE_TABLET | Freq: Every day | ORAL | Status: DC
  Administered 2018-11-04: 81 mg via ORAL
  Filled 2018-11-03: qty 1

## 2018-11-03 MED ORDER — FENTANYL CITRATE (PF) 100 MCG/2ML IJ SOLN
INTRAMUSCULAR | Status: DC | PRN
  Administered 2018-11-03: 25 ug via INTRAVENOUS

## 2018-11-03 SURGICAL SUPPLY — 13 items
CATH BALLN WEDGE 5F 110CM (CATHETERS) ×2 IMPLANT
CATH OPTITORQUE TIG 4.0 5F (CATHETERS) ×2 IMPLANT
DEVICE RAD COMP TR BAND LRG (VASCULAR PRODUCTS) ×2 IMPLANT
GLIDESHEATH SLEND A-KIT 6F 22G (SHEATH) ×2 IMPLANT
GUIDEWIRE .025 260CM (WIRE) ×2 IMPLANT
GUIDEWIRE INQWIRE 1.5J.035X260 (WIRE) ×1 IMPLANT
INQWIRE 1.5J .035X260CM (WIRE) ×2
KIT HEART LEFT (KITS) ×2 IMPLANT
PACK CARDIAC CATHETERIZATION (CUSTOM PROCEDURE TRAY) ×2 IMPLANT
SHEATH GLIDE SLENDER 4/5FR (SHEATH) ×2 IMPLANT
TRANSDUCER W/STOPCOCK (MISCELLANEOUS) ×2 IMPLANT
TUBING CIL FLEX 10 FLL-RA (TUBING) ×2 IMPLANT
WIRE HI TORQ VERSACORE-J 145CM (WIRE) ×2 IMPLANT

## 2018-11-03 NOTE — Progress Notes (Signed)
Family Medicine Teaching Service Daily Progress Note Intern Pager: [phone]  Patient name: Glenn [name] Medical record number: [id number] Date of birth: January 12, 1951 Age: 68 y.o. Gender: male  Primary Care Provider: [name], MD Consultants: Cardiology Code Status: Full  Pt Overview and Major Events to Date:  9/10- admitted for acute hypoxemic respiratory failure after cardiovesions 9/10- Inverted T waves concerning for acute ischemia -> heparin gtt 9/12- aPTT subtherapeutic switched to enoxaparin 9/13- Cardiology requested switch back to heparin gtt  9/14 - Left and right heart cath   Assessment and Plan: Glenn [name] is a 68 y.o. male presenting with respiratory distress, now c/f NSTEMI. PMH is significant for AFIB, NICM with ICD, HTN, left MCA CVA, Hep C, hx of cocaine and tobacco abuse.  QTC normal limits.   Cardiomyopathy with HFreF (EF 15-20%)   Nonsustained V. Tach Patient had left and right heart cath today.  Denies shortness of breath or chest pain.  EKG this morning with atrial fibrillation.  Overnight team was notified for V. Tach shown on telemetry. Patient was will be transitioned from heparin to Eliquis following cath.  Cardiology recommended patient restarting Lasix.  Right and left heart cath showed patient has clean coronary arteries with elevated filling pressures consistent with nonischemic cardiomyopathy with acute on chronic systolic heart failure.  - cardiology consulted, appreciate recs -Restart Tikosyn (QTc 422),  250 mg BID -Restart Eliquis 5mg  BID  -Coreg 25mg  BID  - Lasix 40 mg IV  -Discontinue Cardizem  - Start ARB or Entresto at discharge if renal function stable - daily Mag and K - goal K4 and Mag 2 -daily EKG   Atrial fibrillation with RVR s/p cardioversion on 9/9: Continued AFIB on exam and EKG this morning with atrial fibrillation, HR 102. QTc 422 (WNL).  Patient restarted on Tikosyn today following his left and right heart cath.   Patient was restarted on Eliquis.  -Continue to monitor QT interval with daily EKGs - Cardiology consulted, appreciate recommendations - continue home Carvedilol  -  R/L heart cath completed today  Acute CHF exacerbation Lasix IV after right and left heart cath today.  Creatinine 1.94 on admission and today 1.27.  Patient not appear volume overloaded on exam.  No appreciable lower extremity edema.  Weight 202.1 lbs on admission and today 206.7 lbs.  Patient with 8 unmeasured urine occurrences and 325 mLs.   -STRICT I/Os -daily weights -Lasiv IV 40 mg   Acute kidney injury:  Improving.  Creatine on admission 1.94 and today 1.27. Baseline 1.1-1.2.  - Avoid IVF as the patient is concurrently having a CHF exacerbation - daily BMPs    Hypertension BP normotensive overnight.  Home medication is Coreg 25 mg twice daily.   -Continue home medicications -Monitor blood pressures -We will consider statin prior to discharge  Constipation -continue miralax  Tobacco use disorder Patient endorses smoking 0.5 packs/day for the past 30-40 years.  -Encourage smoking cessation -Nicotine patch if needed  H/o CVA left MCA Stable. Patient had a stroke on 09/05/2016. No residual deficits.  Patient underwent chemical thrombectomy for left M2 occlusion.  Appears the patient previously was on aspirin 81 mg daily, however not recently on this. Unclear patient should be on this.  -81 mg ASA recommendation for cardiology  H/o cocaine use disorder Patient with history of cocaine use. Possible contribution to acute SOB and heart failure. -UDS positive for benzodiazepines as patient is prescribed valium     Chronic Hep C s/p treatment Patient  diagnosed in March 2019 likely due to history of drug use in the 1980s.  There is no personal or family history of liver disease.  Patient received treatment for hep C in April 2019  FEN/GI: heart healthy diet PPx: heparin  Disposition: pending medical  clearance after restarting Tikosyn today    Subjective:  Patient reports some abdominal discomfort with the IV fluids.  No significant events overnight.  Objective: Temp:  [97.4 F (36.3 C)-98.4 F (36.9 C)] 97.8 F (36.6 C) (09/14 0944) Pulse Rate:  [68-106] 105 (09/14 0944) Resp:  [16-18] 16 (09/14 0944) BP: (110-134)/(69-95) 121/94 (09/14 0944) SpO2:  [95 %-100 %] 95 % (09/14 0944) Weight:  [93.8 kg] 93.8 kg (09/14 0401)    Physical Exam: GEN: pleasant male, conversational and in no acute distress CV: Tachycardic rate, irregularly irregular rhythm, Distal pulses intact  RESP: no increased work of breathing, clear to ascultation bilaterally  ABD: Bowel sounds present. Soft, Nontender, Nondistended.   MSK: no edema, or cyanosis noted SKIN: warm, dry NEURO: Alert and oriented, grossly normal       Laboratory: Recent Labs  Lab 10/30/18 0338 10/31/18 0819 11/03/18 0604  WBC 6.3 8.4 4.6  HGB 12.2* 11.8* 12.7*  HCT 37.3* 35.6* 39.2  PLT 179 184 182   Recent Labs  Lab 10/29/18 2155  11/01/18 0552 11/02/18 0503 11/03/18 0604  NA 138   < > 141 141 140  K 4.4   < > 4.0 4.4 4.5  CL 104   < > 104 106 107  CO2 26   < > 27 25 23   BUN 16   < > 24* 18 16  CREATININE 1.94*   < > 1.45* 1.27* 1.23  CALCIUM 9.1   < > 8.9 9.0 8.9  PROT 7.9  --   --   --   --   BILITOT 1.1  --   --   --   --   ALKPHOS 100  --   --   --   --   ALT 27  --   --   --   --   AST 31  --   --   --   --   GLUCOSE 167*   < > 101* 113* 93   < > = values in this interval not displayed.    Imaging/Diagnostic Tests: Lyndee Hensen, DO PGY-1, [facility] 11/03/2018 9:55 AM    FPTS Intern pager: [phone], text pages welcome

## 2018-11-03 NOTE — Progress Notes (Signed)
[facility] pa for Dr turner paged as pt says he feels like he is getting like he was when he got here, " they told me in the cath lab today I was going to need some lasix"  Candace NP returned call and will place order for diuretic

## 2018-11-03 NOTE — Progress Notes (Addendum)
Progress Note  Patient Name: Glenn [name] Date of Encounter: 11/03/2018  Primary Cardiologist: Virl Axe, MD   Subjective   Plan for cath today. No chest pain or sob. Currently in Afib.   Inpatient Medications    Scheduled Meds: . carvedilol  25 mg Oral BID WC  . gabapentin  300 mg Oral TID  . polyethylene glycol  17 g Oral Daily  . sodium chloride flush  3 mL Intravenous Q12H  . vitamin B-12  500 mcg Oral Daily  . vitamin C  500 mg Oral Daily   Continuous Infusions: . sodium chloride    . sodium chloride 10 mL/hr at 11/03/18 0509  . heparin 2,700 Units/hr (11/03/18 0505)   PRN Meds: sodium chloride, acetaminophen **OR** acetaminophen, albuterol, diazepam, ipratropium-albuterol, sodium chloride flush   Vital Signs    Vitals:   11/02/18 1137 11/02/18 1942 11/03/18 0343 11/03/18 0401  BP: (!) 127/93 110/69 (!) 134/95   Pulse: (!) 106 68 85   Resp: 18 17 17    Temp: (!) 97.4 F (36.3 C) 98.4 F (36.9 C) (!) 97.4 F (36.3 C)   TempSrc: Oral Oral Oral   SpO2: 100% 98% 99%   Weight:    93.8 kg  Height:        Intake/Output Summary (Last 24 hours) at 11/03/2018 0747 Last data filed at 11/03/2018 0509 Gross per 24 hour  Intake 1584.64 ml  Output 325 ml  Net 1259.64 ml   Last 3 Weights 11/03/2018 11/02/2018 11/01/2018  Weight (lbs) 206 lb 11.2 oz 204 lb 14.4 oz 205 lb 12.8 oz  Weight (kg) 93.759 kg 92.942 kg 93.35 kg      Telemetry    Afib, rates 70-80s,peak up to 130s, with occasional PVCs - Personally Reviewed  ECG    Pending - Personally Reviewed  Physical Exam   GEN: No acute distress.   Neck: No JVD Cardiac: Irreg Irreg, no murmurs, rubs, or gallops.  Respiratory: Clear to auscultation bilaterally. GI: Soft, nontender, non-distended  MS: No edema; No deformity. Neuro:  Nonfocal  Psych: Normal affect   Labs    High Sensitivity Troponin:   Recent Labs  Lab 10/29/18 2355 10/30/18 0127 10/30/18 0339 10/30/18 1649 10/30/18 1826   TROPONINIHS 55* 58* 52* 31* 32*      Chemistry Recent Labs  Lab 10/29/18 2155  10/31/18 0819 11/01/18 0552 11/02/18 0503  NA 138   < > 139 141 141  K 4.4   < > 4.4 4.0 4.4  CL 104   < > 101 104 106  CO2 26   < > 28 27 25   GLUCOSE 167*   < > 160* 101* 113*  BUN 16   < > 25* 24* 18  CREATININE 1.94*   < > 1.65* 1.45* 1.27*  CALCIUM 9.1   < > 9.1 8.9 9.0  PROT 7.9  --   --   --   --   ALBUMIN 4.3  --   --   --  3.7  AST 31  --   --   --   --   ALT 27  --   --   --   --   ALKPHOS 100  --   --   --   --   BILITOT 1.1  --   --   --   --   GFRNONAA 35*   < > 43* 50* 58*  GFRAA 41*   < > 49* 58* >60  ANIONGAP 8   < > 10 10 10    < > = values in this interval not displayed.     Hematology Recent Labs  Lab 10/29/18 2155 10/29/18 2303 10/30/18 0338 10/31/18 0819  WBC 7.1  --  6.3 8.4  RBC 4.41  --  3.84* 3.64*  HGB 14.2 14.3 12.2* 11.8*  HCT 44.8 42.0 37.3* 35.6*  MCV 101.6*  --  97.1 97.8  MCH 32.2  --  31.8 32.4  MCHC 31.7  --  32.7 33.1  RDW 14.0  --  13.5 13.7  PLT 222  --  179 184    BNP Recent Labs  Lab 10/29/18 2155  BNP 1,167.1*     DDimer No results for input(s): DDIMER in the last 168 hours.   Radiology    No results found.  Cardiac Studies   ECHO  1. The left ventricle has severely reduced systolic function, with an ejection fraction of 15-20%. The cavity size was moderately dilated. There is moderate concentric left ventricular hypertrophy. Left ventricular diastolic function could not be evaluated secondary to atrial fibrillation. Left ventricular diffuse hypokinesis. 2. The right ventricle has moderately reduced systolic function. The cavity was moderately enlarged. There is no increase in right ventricular wall thickness. Right ventricular systolic pressure is moderately elevated with an estimated pressure of 45.5 mmHg. 3. Left atrial size was moderately dilated. 4. Right atrial size was severely dilated. 5. Tricuspid valve regurgitation  is severe. 6. Mild thickening of the aortic valve. Aortic valve regurgitation is moderate by color flow Doppler. 7. The aorta is normal unless otherwise noted.  Patient Profile     68 y.o. male with a hx of Afib with ICD,Chronic systolic Heart failure(EF 30-35% 2018),NICM (reported nonobstructive CAD on cath in [place]), noncompliance,hepatitis C, hypertension, h/o stroke, tobacco abuse,h/oofcocaine usewho is being seen for the evaluation of heart failure.  Assessment & Plan    CARDIOMYOPATHY with Acute on chronic systolic CHF s/p ICD -echo now with EF decline from 30-25% down to 15%     -reported nonobstructive dx on cath in the past in [place] -denies any CP/SOB today -I&Os appear incomplete -diuretics on hold due to increased creatinine and need for cath today -creatinine improved to 1.23 with holding diuretics. -Cardizem stopped due to LV dysfunction  -TSH normal -no ARB for now due to CKD and need for cath but should be started on ARB or Entresto at discharge if renal function stable post cath -continue Carvedilol -Trop is not diagnostic of an acute coronary syndrome.   -plan for right and left heart cath today  AKI on CKD Stage III:     -diuretics have been on hold over weekend for cath today -creatinine improving 1.65>1.45>1.27 > 1.23  Persistent Afib  - Patient had recent cardioversion 9/9  -Holding Tikosyn with prolonged QT.     -QTc improved on EKG yesterday (435 ms) -repeat EKG today -Currently in Afib, rates 70-80s  - Patient denies symptoms -Eliquis on hold for cath -On IV Heparin per pharmacy - Continue with rate control for now - Mag this AM 1.9. Potassium 4.5 - Consider EP consult post-cath  HTN -BP is controlled at 134/45mmHg -continue Carvedilol 25mg  BID  Elevated troponin -mildly elevated at 45>55>58>31>32 -likely related to CHF in the setting of CKD -had non obstructive CAD by cath remotely in [place] for right and left cath today - Lovenox  changed to IV heparin full dose per pharmacy  H/o stroke - stroke in 2018 with  no residual effects - Continue Eliquis post cath  Dyslipidemia - HDL 26, LDL 76, TG 48 - Recommend lifestyle changes  For questions or updates, please contact [place] Please consult [email or web address] for contact info under        Signed, Baleria Wyman Ninfa Meeker, PA-C  11/03/2018, 7:47 AM

## 2018-11-03 NOTE — Progress Notes (Signed)
Completing rounds on unit and stopped to see Glenn [name].  I told Glenn [name] about chaplain services and he requested that we pray.  I offered prayer, and let him know to contact nurse if he ever wanted a chaplain to come back.

## 2018-11-03 NOTE — Interval H&P Note (Signed)
Cath Lab Visit (complete for each Cath Lab visit)  Clinical Evaluation Leading to the Procedure:   ACS: No.  Non-ACS:    Anginal Classification: CCS I  Anti-ischemic medical therapy: No Therapy  Non-Invasive Test Results: No non-invasive testing performed  Prior CABG: No previous CABG      History and Physical Interval Note:  11/03/2018 10:49 AM  Glenn [name]  has presented today for surgery, with the diagnosis of HF.  The various methods of treatment have been discussed with the patient and family. After consideration of risks, benefits and other options for treatment, the patient has consented to  Procedure(s): RIGHT/LEFT HEART CATH AND CORONARY ANGIOGRAPHY (N/A) as a surgical intervention.  The patient's history has been reviewed, patient examined, no change in status, stable for surgery.  I have reviewed the patient's chart and labs.  Questions were answered to the patient's satisfaction.     Quay Burow

## 2018-11-03 NOTE — Progress Notes (Signed)
Pt has been NPO excpet few sips with meds, afib on telemetry, CCMD called to advise pt to cath lab, heparin qtt dc on call to cath lab, denies cp or sob.

## 2018-11-03 NOTE — Progress Notes (Signed)
[facility] for Apixaban >> Heparin Indication: atrial fibrillation, new concern for ACS  Allergies  Allergen Reactions  . Benadryl [Diphenhydramine] Palpitations    Patient Measurements: Height: 5\' 7"  (170.2 cm) Weight: 206 lb 11.2 oz (93.8 kg) IBW/kg (Calculated) : 66.1 Heparin Dosing Weight: 85.3 kg  Vital Signs: Temp: 97.4 F (36.3 C) (09/14 0343) Temp Source: Oral (09/14 0343) BP: 134/95 (09/14 0343) Pulse Rate: 85 (09/14 0343)  Labs: Recent Labs    11/01/18 0552 11/01/18 0938 11/01/18 1814 11/02/18 0503 11/03/18 0604  HGB  --   --   --   --  12.7*  HCT  --   --   --   --  39.2  PLT  --   --   --   --  182  APTT  --  32 35  --  51*  HEPARINUNFRC  --  0.46  --   --  0.60  CREATININE 1.45*  --   --  1.27* 1.23    Estimated Creatinine Clearance: 64.5 mL/min (by C-G formula based on SCr of 1.23 mg/dL).   Assessment: 62 YOM on Apixaban PTA for hx Afib resumed this admission and now with EKG changes concerning for ischemia. Apixaban discontinued and Heparin initiated while awaiting ACS work-up.   Heparin level is therapeutic at 0.6, aPTT is below goal at 51 sec. Last apixaban dose was 9/10 and renal function much improved since admit. Heparin level likely to be more accurate at this point. No bleeding noted, Hgb stable 11-12s, platelets are normal.  Goal of Therapy:  Heparin level 0.3-0.7 units/ml Monitor platelets by anticoagulation protocol: Yes   Plan:  - Continue heparin infusion at 2700 units/hr - Daily heparin level, CBC - Watch for signs/symptoms of bleeding - F/U after cath   Thank you for involving pharmacy in this patient's care.  Renold Genta, PharmD, BCPS Clinical Pharmacist Clinical phone for 11/03/2018 until 3p is [phone] 11/03/2018 8:45 AM  **Pharmacist phone directory can be found on [email or web address] listed under [place]**

## 2018-11-04 DIAGNOSIS — I4891 Unspecified atrial fibrillation: Secondary | ICD-10-CM | POA: Diagnosis present

## 2018-11-04 LAB — BASIC METABOLIC PANEL
Anion gap: 11 (ref 5–15)
Anion gap: 13 (ref 5–15)
BUN: 15 mg/dL (ref 8–23)
BUN: 14 mg/dL (ref 8–23)
CO2: 25 mmol/L (ref 22–32)
CO2: 25 mmol/L (ref 22–32)
Calcium: 8.8 mg/dL — ABNORMAL LOW (ref 8.9–10.3)
Calcium: 9.1 mg/dL (ref 8.9–10.3)
Chloride: 104 mmol/L (ref 98–111)
Chloride: 103 mmol/L (ref 98–111)
Creatinine, Ser: 1.27 mg/dL — ABNORMAL HIGH (ref 0.61–1.24)
Creatinine, Ser: 1.3 mg/dL — ABNORMAL HIGH (ref 0.61–1.24)
GFR calc Af Amer: >60 mL/min (ref >60)
GFR calc Af Amer: >60 mL/min (ref >60)
GFR calc non Af Amer: 58 mL/min — ABNORMAL LOW (ref >60)
GFR calc non Af Amer: 57 mL/min — ABNORMAL LOW (ref >60)
Glucose, Bld: 104 mg/dL — ABNORMAL HIGH (ref 70–99)
Glucose, Bld: 112 mg/dL — ABNORMAL HIGH (ref 70–99)
Potassium: 4.3 mmol/L (ref 3.5–5.1)
Potassium: 3.8 mmol/L (ref 3.5–5.1)
Sodium: 140 mmol/L (ref 135–145)
Sodium: 141 mmol/L (ref 135–145)

## 2018-11-04 LAB — MAGNESIUM
Magnesium: 1.8 mg/dL (ref 1.7–2.4)
Magnesium: 1.9 mg/dL (ref 1.7–2.4)

## 2018-11-04 LAB — CULTURE, BLOOD (ROUTINE X 2)
Culture: NO GROWTH
Culture: NO GROWTH
Special Requests: ADEQUATE
Special Requests: ADEQUATE

## 2018-11-04 LAB — CBC
HCT: 37.4 % — ABNORMAL LOW (ref 39.0–52.0)
Hemoglobin: 12.8 g/dL — ABNORMAL LOW (ref 13.0–17.0)
MCH: 32.9 pg (ref 26.0–34.0)
MCHC: 34.2 g/dL (ref 30.0–36.0)
MCV: 96.1 fL (ref 80.0–100.0)
Platelets: 166 10*3/uL (ref 150–400)
RBC: 3.89 MIL/uL — ABNORMAL LOW (ref 4.22–5.81)
RDW: 13.6 % (ref 11.5–15.5)
WBC: 4.7 10*3/uL (ref 4.0–10.5)
nRBC: 0 % (ref 0.0–0.2)

## 2018-11-04 MED ORDER — METOPROLOL TARTRATE 5 MG/5ML IV SOLN
5.0000 mg | Freq: Once | INTRAVENOUS | Status: AC
  Administered 2018-11-04: 5 mg via INTRAVENOUS
  Filled 2018-11-04: qty 5

## 2018-11-04 MED ORDER — SODIUM CHLORIDE 0.9 % IV SOLN
250.0000 mL | INTRAVENOUS | Status: DC | PRN
  Administered 2018-11-05: 250 mL via INTRAVENOUS

## 2018-11-04 MED ORDER — SPIRONOLACTONE 12.5 MG HALF TABLET
12.5000 mg | ORAL_TABLET | Freq: Every day | ORAL | Status: DC
  Administered 2018-11-04 – 2018-11-06 (×3): 12.5 mg via ORAL
  Filled 2018-11-04 (×3): qty 1

## 2018-11-04 MED ORDER — MAGNESIUM SULFATE 2 GM/50ML IV SOLN
2.0000 g | Freq: Once | INTRAVENOUS | Status: AC
  Administered 2018-11-04: 2 g via INTRAVENOUS
  Filled 2018-11-04: qty 50

## 2018-11-04 MED ORDER — SODIUM CHLORIDE 0.9% FLUSH: 3.0000 mL | INTRAVENOUS | Status: DC | PRN

## 2018-11-04 NOTE — Progress Notes (Signed)
PT has been Afib RVR HR 110-120's sustaining all night. Pt was Asymptomatic and DR Chauncey Reading was made aware, will monitor.

## 2018-11-04 NOTE — H&P (View-Only) (Signed)
Progress Note  Patient Name: Glenn [name] Date of Encounter: 11/04/2018  Primary Cardiologist: Virl Axe, MD   Subjective   Denies any chest pain or SOB.  Cath yesterday with no CAD and elevated filling pressures  Inpatient Medications    Scheduled Meds: . apixaban  5 mg Oral BID  . aspirin  81 mg Oral Daily  . carvedilol  25 mg Oral BID WC  . dofetilide  250 mcg Oral BID  . furosemide  40 mg Intravenous BID  . gabapentin  300 mg Oral TID  . polyethylene glycol  17 g Oral Daily  . sodium chloride flush  3 mL Intravenous Q12H  . sodium chloride flush  3 mL Intravenous Q12H  . vitamin B-12  500 mcg Oral Daily  . vitamin C  500 mg Oral Daily   Continuous Infusions: . sodium chloride    . magnesium sulfate bolus IVPB     PRN Meds: sodium chloride, acetaminophen **OR** acetaminophen, albuterol, diazepam, ipratropium-albuterol, morphine injection, sodium chloride flush   Vital Signs    Vitals:   11/04/18 0425 11/04/18 0558 11/04/18 0615 11/04/18 0920  BP: (!) 136/105  (!) 137/95 (!) 122/99  Pulse: 88 98 (!) 101 94  Resp: 15     Temp: 98.5 F (36.9 C)  98.3 F (36.8 C)   TempSrc: Oral  Oral   SpO2: 96%  95% 95%  Weight:      Height:        Intake/Output Summary (Last 24 hours) at 11/04/2018 0923 Last data filed at 11/04/2018 0816 Gross per 24 hour  Intake 1151.33 ml  Output 1702 ml  Net -550.67 ml   Last 3 Weights 11/04/2018 11/03/2018 11/02/2018  Weight (lbs) 201 lb 12.8 oz 206 lb 11.2 oz 204 lb 14.4 oz  Weight (kg) 91.536 kg 93.759 kg 92.942 kg      Telemetry    Atrial fibrillation with PVCs- Personally Reviewed  ECG    No new EKG to review - Personally Reviewed  Physical Exam   GEN: Well nourished, well developed in no acute distress HEENT: Normal NECK: No JVD; No carotid bruits LYMPHATICS: No lymphadenopathy CARDIAC:irregularly irregular, no murmurs, rubs, gallops RESPIRATORY:  Clear to auscultation without rales, wheezing or rhonchi   ABDOMEN: Soft, non-tender, non-distended MUSCULOSKELETAL:  No edema; No deformity  SKIN: Warm and dry NEUROLOGIC:  Alert and oriented x 3 PSYCHIATRIC:  Normal affect    Labs    High Sensitivity Troponin:   Recent Labs  Lab 10/29/18 2355 10/30/18 0127 10/30/18 0339 10/30/18 1649 10/30/18 1826  TROPONINIHS 55* 58* 52* 31* 32*      Chemistry Recent Labs  Lab 10/29/18 2155  11/02/18 0503 11/03/18 0604 11/03/18 1129 11/03/18 1138 11/04/18 0627  NA 138   < > 141 140 142  144 137 140  K 4.4   < > 4.4 4.5 4.6  4.6 4.4 4.3  CL 104   < > 106 107  --   --  104  CO2 26   < > 25 23  --   --  25  GLUCOSE 167*   < > 113* 93  --   --  104*  BUN 16   < > 18 16  --   --  15  CREATININE 1.94*   < > 1.27* 1.23  --   --  1.27*  CALCIUM 9.1   < > 9.0 8.9  --   --  8.8*  PROT 7.9  --   --   --   --   --   --  ALBUMIN 4.3  --  3.7  --   --   --   --   AST 31  --   --   --   --   --   --   ALT 27  --   --   --   --   --   --   ALKPHOS 100  --   --   --   --   --   --   BILITOT 1.1  --   --   --   --   --   --   GFRNONAA 35*   < > 58* >60  --   --  58*  GFRAA 41*   < > >60 >60  --   --  >60  ANIONGAP 8   < > 10 10  --   --  11   < > = values in this interval not displayed.     Hematology Recent Labs  Lab 10/31/18 0819 11/03/18 0604 11/03/18 1129 11/03/18 1138 11/04/18 0627  WBC 8.4 4.6  --   --  4.7  RBC 3.64* 4.00*  --   --  3.89*  HGB 11.8* 12.7* 12.9*  12.9* 12.2* 12.8*  HCT 35.6* 39.2 38.0*  38.0* 36.0* 37.4*  MCV 97.8 98.0  --   --  96.1  MCH 32.4 31.8  --   --  32.9  MCHC 33.1 32.4  --   --  34.2  RDW 13.7 13.5  --   --  13.6  PLT 184 182  --   --  166    BNP Recent Labs  Lab 10/29/18 2155  BNP 1,167.1*     DDimer No results for input(s): DDIMER in the last 168 hours.   Radiology    No results found.  Cardiac Studies   ECHO  1. The left ventricle has severely reduced systolic function, with an ejection fraction of 15-20%. The cavity size was  moderately dilated. There is moderate concentric left ventricular hypertrophy. Left ventricular diastolic function could not be evaluated secondary to atrial fibrillation. Left ventricular diffuse hypokinesis. 2. The right ventricle has moderately reduced systolic function. The cavity was moderately enlarged. There is no increase in right ventricular wall thickness. Right ventricular systolic pressure is moderately elevated with an estimated pressure of 45.5 mmHg. 3. Left atrial size was moderately dilated. 4. Right atrial size was severely dilated. 5. Tricuspid valve regurgitation is severe. 6. Mild thickening of the aortic valve. Aortic valve regurgitation is moderate by color flow Doppler. 7. The aorta is normal unless otherwise noted.  Cardiac Cath 11/03/2018 IMPRESSION: Glenn [name] has clean coronary arteries, elevated filling pressures consistent with a nonischemic cardiomyopathy with acute on chronic systolic heart failure.  He is in A. fib with RVR.  He will need pharmacologic optimization and initiation of diuretic therapy for LVEDP and elevated filling pressures.  He already has an ICD in place.   Patient Profile     68 y.o. male with a hx of Afib with ICD,Chronic systolic Heart failure(EF 30-35% 2018),NICM (reported nonobstructive CAD on cath in [place]), noncompliance,hepatitis C, hypertension, h/o stroke, tobacco abuse,h/oofcocaine usewho is being seen for the evaluation of heart failure.  Assessment & Plan    CARDIOMYOPATHY with Acute on chronic systolic CHF s/p ICD -echo now with EF decline from 30-25% down to 15%     -reported nonobstructive dx on cath in the past in [place] -cath yesterday with normal cors and elevated filling pressures -  started on IV Lasix 40mg  IV BID -put out 1.7L yesterday and is net neg 630cc -weight down 5lbs from yesterday but needs more diuresis -creatinine stable with diuresis at 1.27 -TSH normal -continue Carvedilol  -will add Entresto  24-26mg  BID at discharge if renal function remains stable post cath and with diuresis -add spironolactone 12.5mg  daily -BMET in am  AKI on CKD Stage III:     -diuretics have been on hold over weekend for cath today but now back on Lasix 40mg  IV BID for increased filling pressures -creatinine improving 1.65>1.45>1.27 > 1.23>1.27  Persistent Afib  -Patient had recent cardioversion 9/9  -Tikosyn held due prolonged QT which then resolved and now back on lower dose at 244mcg BID.     -EKG pending this am -remains in afib with RVR in the 120's -will give Lopressor 5mg  IV now.  On highest dose of Carvedilol -may need to add lopressor -no CCB due to LV dysfunction -K+ 4.3 this am and Mag 1.8.  Will give 2gm Mag sulfate -now back on Eliquis 5mg  BID -stop ASA -make NPO after MN for DCCV in am if he dose not chemically convert today  HTN -BP is controlled at 134/11mmHg -continue Carvedilol 25mg  BID  Elevated troponin -mildly elevated at 45>55>58>31>32 -likely related to CHF in the setting of CKD -no CAD on cath yesterday  H/o stroke - stroke in 2018 with no residual effects - Continue Eliquis   Dyslipidemia - HDL 26, LDL 76, TG 48 - Recommend lifestyle changes  I have spent a total of 35 minutes with patient reviewing cardiac cath , telemetry, EKGs, labs and examining patient as well as establishing an assessment and plan that was discussed with the patient.  > 50% of time was spent in direct patient care.    For questions or updates, please contact [place] Please consult [email or web address] for contact info under        Signed, Fransico Him, MD  11/04/2018, 9:23 AM

## 2018-11-04 NOTE — Care Management Important Message (Signed)
Important Message  Patient Details  Name: Glenn [name] MRN: [id number] Date of Birth: Jul 01, 1950   Medicare Important Message Given:  Yes     [name] 11/04/2018, 11:10 AM

## 2018-11-04 NOTE — Progress Notes (Signed)
Family Medicine Teaching Service Daily Progress Note Intern Pager: [phone]  Patient name: Glenn [name] Medical record number: [id number] Date of birth: [birth year] Age: 68 y.o. Gender: male  Primary Care Provider: [name], MD Consultants: Cardiology Code Status: Full  Pt Overview and Major Events to Date:  9/10- admitted for acute hypoxemic respiratory failure after cardiovesions 9/10- Inverted T waves concerning for acute ischemia -> heparin gtt 9/12- aPTT subtherapeutic switched to enoxaparin 9/13- Cardiology requested switch back to heparin gtt  9/14 - Left and right heart cath   Assessment and Plan: Glenn [name] is a 68 y.o. male presenting with respiratory distress, now c/f NSTEMI. PMH is significant for AFIB, NICM with ICD, HTN, left MCA CVA, Hep C, hx of cocaine and tobacco abuse.   Cardiomyopathy with HFreF (EF 15-20%)  Nonsustained V. Tach Patient had left and right heart cath yesterday that showed some elevated filling pressures but no significant coronary artery disease.  This morning patient still in atrial fibrillation.  Patient to have 40 mg twice daily Lasix, and spironolactone 12.5 mg daily. Patient to have cardioversion tomorrow patient still in A. Fib.  QTc this morning 437.  We will continue to trend QTc with daily EKGs. Consult electrophysiology patient restart of Tikosyn.  Contacted cardiology who stated they will contact EP. - cardiology following, appreciate recs -Tikosyn,  250 mg BID -Eliquis 5mg  BID  -Coreg 25mg  BID  - Lasix 40 mg BID -Spirolactone 12.5 mg daily  -NPO at midnight for cardioversion.  -Discontinue Cardizem  - Start ARB or Entresto at discharge if renal function stable - daily Mag and K - goal K4 and Mag 2, replete as needed -daily EKG   Atrial fibrillation with RVR s/p cardioversion on 9/9: Contacted cardiology who stated they will see consult EP.  Patient continues to have atrial fibrillation and is scheduled for  cardioversion tomorrow if he does not chemically convert with Tikosyn.  Continue to monitor EKGs for QT prolongation. -Tikosyn and Eliquis -Electrical conversion if patient not chemically convert to sinus rhythm -Continue to monitor QT interval with daily EKGs - Cardiology following, Appreciate recommendations - continue home Carvedilol    Acute CHF exacerbation Patient had a right heart cath yesterday.  Patient had clear coronary arteries.  Lasix twice daily 40 mg.  Avoid IV fluids.  Creatinine 1.94 on admission and today 1.27.  Patient not appear volume overloaded on exam.  No appreciable lower extremity edema.  Weight 202.1 lbs on admission and today 201.8  lbs.  Patient with 1.7 L diuresis. -STRICT I/Os -daily weights -Lasiv IV 40 mg BID  Acute kidney injury:  Improving.  Creatine on admission 1.94 and today 1.27. Baseline 1.1-1.2.  - Avoid IVF as the patient is concurrently having a CHF exacerbation - daily BMPs    Hypertension BP normotensive overnight.  Home medication is Coreg 25 mg twice daily.   -Continue home medicications -Monitor blood pressures -We will consider statin prior to discharge  Constipation -continue miralax  Tobacco use disorder Patient endorses smoking 0.5 packs/day for the past 30-40 years.  -Encourage smoking cessation -Nicotine patch if needed  H/o CVA left MCA Stable. Patient had a stroke on 09/05/2016. No residual deficits.  Patient underwent chemical thrombectomy for left M2 occlusion.  Appears the patient previously was on aspirin 81 mg daily, however not recently on this. Unclear patient should be on this.  - Discontinue per cardiology 81 mg ASA recommendation for cardiology  H/o cocaine use disorder Patient with history of  cocaine use. Possible contribution to acute SOB and heart failure. -UDS positive for benzodiazepines as patient is prescribed valium     Chronic Hep C s/p treatment Patient diagnosed in March 2019 likely due to  history of drug use in the 1980s.  There is no personal or family history of liver disease.  Patient received treatment for hep C in April 2019  FEN/GI: heart healthy diet PPx: Eliquis   Disposition: pending medical clearance after restarting Tikosyn today    Subjective:  Patient with no complaints this morning.  No significant events overnight.   Objective: Temp:  [97.8 F (36.6 C)-98.5 F (36.9 C)] 98.4 F (36.9 C) (09/15 1152) Pulse Rate:  [63-116] 105 (09/15 1152) Resp:  [14-18] 18 (09/15 1152) BP: (110-137)/(69-114) 135/87 (09/15 1152) SpO2:  [95 %-98 %] 97 % (09/15 1152) Weight:  [91.5 kg] 91.5 kg (09/15 0422)    Physical Exam: GEN: alert, conversational,  CV: Tachycardic, irregularly irregular rhythm no murmurs appreciated  RESP: no increased work of breathing, clear to ascultation bilaterally with no crackles, wheezes, or rhonchi ABD: Bowel sounds present. Soft, Nontender, Nondistended.  MSK: trace lower extremity edema, or cyanosis noted SKIN: warm, dry NEURO: grossly normal, moves all extremities appropriately PSYCH: Normal affect and thought content       Laboratory: Recent Labs  Lab 10/31/18 0819 11/03/18 0604 11/03/18 1129 11/03/18 1138 11/04/18 0627  WBC 8.4 4.6  --   --  4.7  HGB 11.8* 12.7* 12.9*  12.9* 12.2* 12.8*  HCT 35.6* 39.2 38.0*  38.0* 36.0* 37.4*  PLT 184 182  --   --  166   Recent Labs  Lab 10/29/18 2155  11/03/18 0604  11/03/18 1138 11/04/18 0627 11/04/18 0958  NA 138   < > 140   < > 137 140 141  K 4.4   < > 4.5   < > 4.4 4.3 3.8  CL 104   < > 107  --   --  104 103  CO2 26   < > 23  --   --  25 25  BUN 16   < > 16  --   --  15 14  CREATININE 1.94*   < > 1.23  --   --  1.27* 1.30*  CALCIUM 9.1   < > 8.9  --   --  8.8* 9.1  PROT 7.9  --   --   --   --   --   --   BILITOT 1.1  --   --   --   --   --   --   ALKPHOS 100  --   --   --   --   --   --   ALT 27  --   --   --   --   --   --   AST 31  --   --   --   --   --   --    GLUCOSE 167*   < > 93  --   --  104* 112*   < > = values in this interval not displayed.    Imaging/Diagnostic Tests: Lyndee Hensen, DO PGY-1, [facility] 11/04/2018 1:22 PM    [place] Intern pager: [phone], text pages welcome

## 2018-11-04 NOTE — TOC Progression Note (Addendum)
Transition of Care [facility]) - Progression Note    Patient Details  Name: Glenn [name] MRN: [id number] Date of Birth: 11/11/1950  [facility] of [facility]) CM/SW Contact  Zenon Mayo, RN Phone Number: 11/04/2018, 10:09 AM  Clinical Narrative:    From home , chf, afib with rvr, on hep , s/p cath, on eliquis pta, EF of 35 to 15, NCM received consult for Tikosyn, benefit check done. Will have MD send scripts to [facility]. Resident states she will send to [place].         Expected Discharge Plan and Services                                                 Social Determinants of Health (SDOH) Interventions    Readmission Risk Interventions No flowsheet data found.

## 2018-11-04 NOTE — Progress Notes (Addendum)
Progress Note  Patient Name: Glenn [name] Date of Encounter: 11/04/2018  Primary Cardiologist: Virl Axe, MD   Subjective   Denies any chest pain or SOB.  Cath yesterday with no CAD and elevated filling pressures  Inpatient Medications    Scheduled Meds: . apixaban  5 mg Oral BID  . aspirin  81 mg Oral Daily  . carvedilol  25 mg Oral BID WC  . dofetilide  250 mcg Oral BID  . furosemide  40 mg Intravenous BID  . gabapentin  300 mg Oral TID  . polyethylene glycol  17 g Oral Daily  . sodium chloride flush  3 mL Intravenous Q12H  . sodium chloride flush  3 mL Intravenous Q12H  . vitamin B-12  500 mcg Oral Daily  . vitamin C  500 mg Oral Daily   Continuous Infusions: . sodium chloride    . magnesium sulfate bolus IVPB     PRN Meds: sodium chloride, acetaminophen **OR** acetaminophen, albuterol, diazepam, ipratropium-albuterol, morphine injection, sodium chloride flush   Vital Signs    Vitals:   11/04/18 0425 11/04/18 0558 11/04/18 0615 11/04/18 0920  BP: (!) 136/105  (!) 137/95 (!) 122/99  Pulse: 88 98 (!) 101 94  Resp: 15     Temp: 98.5 F (36.9 C)  98.3 F (36.8 C)   TempSrc: Oral  Oral   SpO2: 96%  95% 95%  Weight:      Height:        Intake/Output Summary (Last 24 hours) at 11/04/2018 0923 Last data filed at 11/04/2018 0816 Gross per 24 hour  Intake 1151.33 ml  Output 1702 ml  Net -550.67 ml   Last 3 Weights 11/04/2018 11/03/2018 11/02/2018  Weight (lbs) 201 lb 12.8 oz 206 lb 11.2 oz 204 lb 14.4 oz  Weight (kg) 91.536 kg 93.759 kg 92.942 kg      Telemetry    Atrial fibrillation with PVCs- Personally Reviewed  ECG    No new EKG to review - Personally Reviewed  Physical Exam   GEN: Well nourished, well developed in no acute distress HEENT: Normal NECK: No JVD; No carotid bruits LYMPHATICS: No lymphadenopathy CARDIAC:irregularly irregular, no murmurs, rubs, gallops RESPIRATORY:  Clear to auscultation without rales, wheezing or rhonchi   ABDOMEN: Soft, non-tender, non-distended MUSCULOSKELETAL:  No edema; No deformity  SKIN: Warm and dry NEUROLOGIC:  Alert and oriented x 3 PSYCHIATRIC:  Normal affect    Labs    High Sensitivity Troponin:   Recent Labs  Lab 10/29/18 2355 10/30/18 0127 10/30/18 0339 10/30/18 1649 10/30/18 1826  TROPONINIHS 55* 58* 52* 31* 32*      Chemistry Recent Labs  Lab 10/29/18 2155  11/02/18 0503 11/03/18 0604 11/03/18 1129 11/03/18 1138 11/04/18 0627  NA 138   < > 141 140 142  144 137 140  K 4.4   < > 4.4 4.5 4.6  4.6 4.4 4.3  CL 104   < > 106 107  --   --  104  CO2 26   < > 25 23  --   --  25  GLUCOSE 167*   < > 113* 93  --   --  104*  BUN 16   < > 18 16  --   --  15  CREATININE 1.94*   < > 1.27* 1.23  --   --  1.27*  CALCIUM 9.1   < > 9.0 8.9  --   --  8.8*  PROT 7.9  --   --   --   --   --   --  ALBUMIN 4.3  --  3.7  --   --   --   --   AST 31  --   --   --   --   --   --   ALT 27  --   --   --   --   --   --   ALKPHOS 100  --   --   --   --   --   --   BILITOT 1.1  --   --   --   --   --   --   GFRNONAA 35*   < > 58* >60  --   --  58*  GFRAA 41*   < > >60 >60  --   --  >60  ANIONGAP 8   < > 10 10  --   --  11   < > = values in this interval not displayed.     Hematology Recent Labs  Lab 10/31/18 0819 11/03/18 0604 11/03/18 1129 11/03/18 1138 11/04/18 0627  WBC 8.4 4.6  --   --  4.7  RBC 3.64* 4.00*  --   --  3.89*  HGB 11.8* 12.7* 12.9*  12.9* 12.2* 12.8*  HCT 35.6* 39.2 38.0*  38.0* 36.0* 37.4*  MCV 97.8 98.0  --   --  96.1  MCH 32.4 31.8  --   --  32.9  MCHC 33.1 32.4  --   --  34.2  RDW 13.7 13.5  --   --  13.6  PLT 184 182  --   --  166    BNP Recent Labs  Lab 10/29/18 2155  BNP 1,167.1*     DDimer No results for input(s): DDIMER in the last 168 hours.   Radiology    No results found.  Cardiac Studies   ECHO  1. The left ventricle has severely reduced systolic function, with an ejection fraction of 15-20%. The cavity size was  moderately dilated. There is moderate concentric left ventricular hypertrophy. Left ventricular diastolic function could not be evaluated secondary to atrial fibrillation. Left ventricular diffuse hypokinesis. 2. The right ventricle has moderately reduced systolic function. The cavity was moderately enlarged. There is no increase in right ventricular wall thickness. Right ventricular systolic pressure is moderately elevated with an estimated pressure of 45.5 mmHg. 3. Left atrial size was moderately dilated. 4. Right atrial size was severely dilated. 5. Tricuspid valve regurgitation is severe. 6. Mild thickening of the aortic valve. Aortic valve regurgitation is moderate by color flow Doppler. 7. The aorta is normal unless otherwise noted.  Cardiac Cath 11/03/2018 IMPRESSION: Mr. Guibord has clean coronary arteries, elevated filling pressures consistent with a nonischemic cardiomyopathy with acute on chronic systolic heart failure.  He is in A. fib with RVR.  He will need pharmacologic optimization and initiation of diuretic therapy for LVEDP and elevated filling pressures.  He already has an ICD in place.   Patient Profile     68 y.o. male with a hx of Afib with ICD,Chronic systolic Heart failure(EF 30-35% 2018),NICM (reported nonobstructive CAD on cath in [place]), noncompliance,hepatitis C, hypertension, h/o stroke, tobacco abuse,h/oofcocaine usewho is being seen for the evaluation of heart failure.  Assessment & Plan    CARDIOMYOPATHY with Acute on chronic systolic CHF s/p ICD -echo now with EF decline from 30-25% down to 15%     -reported nonobstructive dx on cath in the past in [place] -cath yesterday with normal cors and elevated filling pressures -  started on IV Lasix 40mg  IV BID -put out 1.7L yesterday and is net neg 630cc -weight down 5lbs from yesterday but needs more diuresis -creatinine stable with diuresis at 1.27 -TSH normal -continue Carvedilol  -will add Entresto  24-26mg  BID at discharge if renal function remains stable post cath and with diuresis -add spironolactone 12.5mg  daily -BMET in am  AKI on CKD Stage III:     -diuretics have been on hold over weekend for cath today but now back on Lasix 40mg  IV BID for increased filling pressures -creatinine improving 1.65>1.45>1.27 > 1.23>1.27  Persistent Afib  -Patient had recent cardioversion 9/9  -Tikosyn held due prolonged QT which then resolved and now back on lower dose at 248mcg BID.     -EKG pending this am -remains in afib with RVR in the 120's -will give Lopressor 5mg  IV now.  On highest dose of Carvedilol -may need to add lopressor -no CCB due to LV dysfunction -K+ 4.3 this am and Mag 1.8.  Will give 2gm Mag sulfate -now back on Eliquis 5mg  BID -stop ASA -make NPO after MN for DCCV in am if he dose not chemically convert today  HTN -BP is controlled at 134/58mmHg -continue Carvedilol 25mg  BID  Elevated troponin -mildly elevated at 45>55>58>31>32 -likely related to CHF in the setting of CKD -no CAD on cath yesterday  H/o stroke - stroke in 2018 with no residual effects - Continue Eliquis   Dyslipidemia - HDL 26, LDL 76, TG 48 - Recommend lifestyle changes  I have spent a total of 35 minutes with patient reviewing cardiac cath , telemetry, EKGs, labs and examining patient as well as establishing an assessment and plan that was discussed with the patient.  > 50% of time was spent in direct patient care.    For questions or updates, please contact [place] Please consult [email or web address] for contact info under        Signed, Fransico Him, MD  11/04/2018, 9:23 AM

## 2018-11-05 ENCOUNTER — Encounter (HOSPITAL_COMMUNITY): Payer: Self-pay

## 2018-11-05 ENCOUNTER — Inpatient Hospital Stay (HOSPITAL_COMMUNITY): Payer: Medicare HMO | Admitting: Anesthesiology

## 2018-11-05 ENCOUNTER — Ambulatory Visit (HOSPITAL_COMMUNITY): Payer: Medicare HMO | Admitting: Nurse Practitioner

## 2018-11-05 ENCOUNTER — Encounter (HOSPITAL_COMMUNITY)
Admission: EM | Disposition: A | Discharge status: Home / Self Care | Payer: Self-pay | Source: Home / Self Care | Attending: Family Medicine

## 2018-11-05 DIAGNOSIS — I4891 Unspecified atrial fibrillation: Secondary | ICD-10-CM

## 2018-11-05 HISTORY — PX: CARDIOVERSION: SHX1299

## 2018-11-05 LAB — BASIC METABOLIC PANEL
Anion gap: 8 (ref 5–15)
BUN: 14 mg/dL (ref 8–23)
CO2: 27 mmol/L (ref 22–32)
Calcium: 8.7 mg/dL — ABNORMAL LOW (ref 8.9–10.3)
Chloride: 105 mmol/L (ref 98–111)
Creatinine, Ser: 1.28 mg/dL — ABNORMAL HIGH (ref 0.61–1.24)
GFR calc Af Amer: >60 mL/min (ref >60)
GFR calc non Af Amer: 58 mL/min — ABNORMAL LOW (ref >60)
Glucose, Bld: 99 mg/dL (ref 70–99)
Potassium: 3.8 mmol/L (ref 3.5–5.1)
Sodium: 140 mmol/L (ref 135–145)

## 2018-11-05 LAB — MAGNESIUM: Magnesium: 1.9 mg/dL (ref 1.7–2.4)

## 2018-11-05 LAB — CBC WITH DIFFERENTIAL/PLATELET
Abs Immature Granulocytes: 0.01 10*3/uL (ref 0.00–0.07)
Basophils Absolute: 0 10*3/uL (ref 0.0–0.1)
Basophils Relative: 1 %
Eosinophils Absolute: 0.4 10*3/uL (ref 0.0–0.5)
Eosinophils Relative: 10 %
HCT: 36.6 % — ABNORMAL LOW (ref 39.0–52.0)
Hemoglobin: 12.6 g/dL — ABNORMAL LOW (ref 13.0–17.0)
Immature Granulocytes: 0 %
Lymphocytes Relative: 36 %
Lymphs Abs: 1.6 10*3/uL (ref 0.7–4.0)
MCH: 32.9 pg (ref 26.0–34.0)
MCHC: 34.4 g/dL (ref 30.0–36.0)
MCV: 95.6 fL (ref 80.0–100.0)
Monocytes Absolute: 0.6 10*3/uL (ref 0.1–1.0)
Monocytes Relative: 14 %
Neutro Abs: 1.8 10*3/uL (ref 1.7–7.7)
Neutrophils Relative %: 39 %
Platelets: 164 10*3/uL (ref 150–400)
RBC: 3.83 MIL/uL — ABNORMAL LOW (ref 4.22–5.81)
RDW: 13.2 % (ref 11.5–15.5)
WBC: 4.4 10*3/uL (ref 4.0–10.5)
nRBC: 0 % (ref 0.0–0.2)

## 2018-11-05 SURGERY — CARDIOVERSION
Anesthesia: General

## 2018-11-05 MED ORDER — POTASSIUM CHLORIDE CRYS ER 20 MEQ PO TBCR
40.0000 meq | EXTENDED_RELEASE_TABLET | Freq: Once | ORAL | Status: AC
  Administered 2018-11-05: 40 meq via ORAL
  Filled 2018-11-05: qty 2

## 2018-11-05 MED ORDER — ETOMIDATE 2 MG/ML IV SOLN
INTRAVENOUS | Status: DC | PRN
  Administered 2018-11-05: 8 mg via INTRAVENOUS

## 2018-11-05 MED ORDER — MAGNESIUM SULFATE 2 GM/50ML IV SOLN
2.0000 g | Freq: Once | INTRAVENOUS | Status: AC
  Administered 2018-11-05: 09:00:00 2 g via INTRAVENOUS
  Filled 2018-11-05: qty 50

## 2018-11-05 MED ORDER — PROPOFOL 10 MG/ML IV BOLUS
INTRAVENOUS | Status: DC | PRN
  Administered 2018-11-05: 15 mg via INTRAVENOUS

## 2018-11-05 MED ORDER — LIDOCAINE 2% (20 MG/ML) 5 ML SYRINGE
INTRAMUSCULAR | Status: DC | PRN
  Administered 2018-11-05: 40 mg via INTRAVENOUS

## 2018-11-05 MED ORDER — SODIUM CHLORIDE 0.9% FLUSH
3.0000 mL | Freq: Two times a day (BID) | INTRAVENOUS | Status: DC
  Administered 2018-11-05 – 2018-11-06 (×3): 3 mL via INTRAVENOUS

## 2018-11-05 MED ORDER — FUROSEMIDE 40 MG PO TABS
40.0000 mg | ORAL_TABLET | Freq: Two times a day (BID) | ORAL | Status: DC
  Administered 2018-11-05 (×2): 40 mg via ORAL
  Filled 2018-11-05 (×2): qty 1

## 2018-11-05 NOTE — Progress Notes (Addendum)
QTC 445 on Cardiac tele. Tikosyn given. @2130 .  Midnight QTC on cardiac tele was 437. Will monitor. Denies complains. Still afib HR 98.

## 2018-11-05 NOTE — Progress Notes (Signed)
EKG done this AM QTC 485, still afib.  Patient requesting to have cardioversion early as possible. Will pass on.

## 2018-11-05 NOTE — Interval H&P Note (Signed)
History and Physical Interval Note:  11/05/2018 2:00 PM  Glenn [name]  has presented today for surgery, with the diagnosis of afib.  The various methods of treatment have been discussed with the patient and family. After consideration of risks, benefits and other options for treatment, the patient has consented to  Procedure(s): CARDIOVERSION (N/A) as a surgical intervention.  The patient's history has been reviewed, patient examined, no change in status, stable for surgery.  I have reviewed the patient's chart and labs.  Questions were answered to the patient's satisfaction.     [name]

## 2018-11-05 NOTE — Anesthesia Preprocedure Evaluation (Addendum)
Anesthesia Evaluation  Patient identified by MRN, date of birth, ID band Patient awake    Reviewed: Allergy & Precautions, NPO status , Patient's Chart, lab work & pertinent test results  Airway Mallampati: III  TM Distance: >3 FB Neck ROM: Full    Dental  (+) Teeth Intact, Dental Advisory Given   Pulmonary Current Smoker,    breath sounds clear to auscultation       Cardiovascular hypertension, Pt. on medications  Rhythm:Irregular Rate:Tachycardia     Neuro/Psych CVA    GI/Hepatic negative GI ROS, (+) Hepatitis -  Endo/Other  negative endocrine ROS  Renal/GU Renal disease     Musculoskeletal   Abdominal Normal abdominal exam  (+)   Peds  Hematology negative hematology ROS (+)   Anesthesia Other Findings   Reproductive/Obstetrics                             Echo: 1. The left ventricle has severely reduced systolic function, with an ejection fraction of 15-20%. The cavity size was moderately dilated. There is moderate concentric left ventricular hypertrophy. Left ventricular diastolic function could not be  evaluated secondary to atrial fibrillation. Left ventricular diffuse hypokinesis.  2. The right ventricle has moderately reduced systolic function. The cavity was moderately enlarged. There is no increase in right ventricular wall thickness. Right ventricular systolic pressure is moderately elevated with an estimated pressure of 45.5  mmHg.  3. Left atrial size was moderately dilated.  4. Right atrial size was severely dilated.  5. Tricuspid valve regurgitation is severe.  6. Mild thickening of the aortic valve. Aortic valve regurgitation is moderate by color flow Doppler.  7. The aorta is normal unless otherwise noted.   Anesthesia Physical Anesthesia Plan  ASA: III  Anesthesia Plan: General   Post-op Pain Management:    Induction: Intravenous  PONV Risk Score and Plan:  Treatment may vary due to age or medical condition  Airway Management Planned: Simple Face Mask and Natural Airway  Additional Equipment: None  Intra-op Plan:   Post-operative Plan:   Informed Consent: I have reviewed the patients History and Physical, chart, labs and discussed the procedure including the risks, benefits and alternatives for the proposed anesthesia with the patient or authorized representative who has indicated his/her understanding and acceptance.       Plan Discussed with: CRNA  Anesthesia Plan Comments:         Anesthesia Quick Evaluation

## 2018-11-05 NOTE — CV Procedure (Signed)
    Cardioversion Note  Glenn [name] [id number] 27-Feb-1950  Procedure: DC Cardioversion Indications: Atrial fib   Procedure Details Consent: Obtained Time Out: Verified patient identification, verified procedure, site/side was marked, verified correct patient position, special equipment/implants available, Radiology Safety Procedures followed,  medications/allergies/relevent history reviewed, required imaging and test results available.  Performed  The patient has been on adequate anticoagulation.  The patient received IV Lidocaine 40 mg iV followed by Etomidate 8 mg and Propofol 20 mg IV  for sedation.  Synchronous cardioversion was performed at  200  joules.  The cardioversion was successful     Complications: No apparent complications Patient did tolerate procedure well.   Thayer Headings, Brooke Bonito., MD, [facility] 11/05/2018, 2:29 PM

## 2018-11-05 NOTE — Transfer of Care (Signed)
Immediate Anesthesia Transfer of Care Note  Patient: Glenn [name]  Procedure(s) Performed: CARDIOVERSION (N/A )  Patient Location: Endoscopy Unit  Anesthesia Type:General  Level of Consciousness: drowsy  Airway & Oxygen Therapy: Patient Spontanous Breathing and Patient connected to nasal cannula oxygen  Post-op Assessment: Report given to RN and Post -op Vital signs reviewed and stable  Post vital signs: Reviewed  Last Vitals:  Vitals Value Taken Time  BP 106/65 11/05/18 1432  Temp    Pulse 70 11/05/18 1434  Resp 18 11/05/18 1434  SpO2 100 % 11/05/18 1434    Last Pain:  Vitals:   11/05/18 1406  TempSrc: Temporal  PainSc: 0-No pain         Complications: No apparent anesthesia complications

## 2018-11-05 NOTE — Anesthesia Procedure Notes (Signed)
Procedure Name: General with mask airway Date/Time: 11/05/2018 2:15 PM Performed by: Janene Harvey, CRNA Pre-anesthesia Checklist: Patient identified, Emergency Drugs available, Suction available and Patient being monitored Patient Re-evaluated:Patient Re-evaluated prior to induction Oxygen Delivery Method: Ambu bag Preoxygenation: Pre-oxygenation with 100% oxygen Dental Injury: Teeth and Oropharynx as per pre-operative assessment

## 2018-11-05 NOTE — Plan of Care (Signed)
  Problem: Education: Goal: Knowledge of disease or condition will improve Outcome: Progressing Goal: Understanding of medication regimen will improve Outcome: Progressing Goal: Individualized Educational Video(s) Outcome: Progressing   

## 2018-11-05 NOTE — Progress Notes (Signed)
PT Cancellation Note  Patient Details Name: Glenn [name] MRN: [id number] DOB: [birth year]   Cancelled Treatment:    Reason Eval/Treat Not Completed: Fatigue/lethargy limiting ability to participate. Will follow-up for PT evaluation.  Mabeline Caras, PT, DPT Acute Rehabilitation Services  Pager [phone] Office [place] 11/05/2018, 5:27 PM

## 2018-11-05 NOTE — Anesthesia Postprocedure Evaluation (Signed)
Anesthesia Post Note  Patient: Glenn [name]  Procedure(s) Performed: CARDIOVERSION (N/A )     Patient location during evaluation: PACU Anesthesia Type: General Level of consciousness: awake and alert Pain management: pain level controlled Vital Signs Assessment: post-procedure vital signs reviewed and stable Respiratory status: spontaneous breathing, nonlabored ventilation, respiratory function stable and patient connected to nasal cannula oxygen Cardiovascular status: blood pressure returned to baseline and stable Postop Assessment: no apparent nausea or vomiting Anesthetic complications: no    Last Vitals:  Vitals:   11/05/18 1505 11/05/18 1523  BP: 111/72 112/72  Pulse: 70 70  Resp: (!) 21   Temp:    SpO2: 97% 98%    Last Pain:  Vitals:   11/05/18 1440  TempSrc: Temporal  PainSc:                  [name]

## 2018-11-05 NOTE — Progress Notes (Signed)
Progress Note  Patient Name: Glenn [name] Date of Encounter: 11/05/2018  Primary Cardiologist: Virl Axe, MD   Subjective   Plan for cardioversion today. No chest pain or sob.   Inpatient Medications    Scheduled Meds: . apixaban  5 mg Oral BID  . carvedilol  25 mg Oral BID WC  . dofetilide  250 mcg Oral BID  . furosemide  40 mg Intravenous BID  . gabapentin  300 mg Oral TID  . polyethylene glycol  17 g Oral Daily  . sodium chloride flush  3 mL Intravenous Q12H  . spironolactone  12.5 mg Oral Daily  . vitamin B-12  500 mcg Oral Daily  . vitamin C  500 mg Oral Daily   Continuous Infusions: . sodium chloride     PRN Meds: sodium chloride, acetaminophen **OR** acetaminophen, albuterol, diazepam, ipratropium-albuterol, sodium chloride flush   Vital Signs    Vitals:   11/04/18 1723 11/04/18 2027 11/05/18 0331 11/05/18 0536  BP: (!) 131/94 104/67 123/69   Pulse: (!) 105 (!) 48 68   Resp:  18 16   Temp:  98.9 F (37.2 C) 98.9 F (37.2 C)   TempSrc:  Oral Oral   SpO2: 98% 99% 97%   Weight:    89.4 kg  Height:        Intake/Output Summary (Last 24 hours) at 11/05/2018 0730 Last data filed at 11/05/2018 0500 Gross per 24 hour  Intake 678 ml  Output 2550 ml  Net -1872 ml   Last 3 Weights 11/05/2018 11/04/2018 11/03/2018  Weight (lbs) 197 lb 201 lb 12.8 oz 206 lb 11.2 oz  Weight (kg) 89.359 kg 91.536 kg 93.759 kg      Telemetry    Afib, HR 90-120s, peak 140s, occasional PVCs- Personally Reviewed  ECG    Afib, 99 bpm, TWI V4-V6, PQtC 485 - Personally Reviewed  Physical Exam   GEN: No acute distress.   Neck: No JVD Cardiac: Irreg Irreg, no murmurs, rubs, or gallops.  Respiratory: Clear to auscultation bilaterally. GI: Soft, nontender, non-distended  MS: No edema; No deformity. Neuro:  Nonfocal  Psych: Normal affect   Labs    High Sensitivity Troponin:   Recent Labs  Lab 10/29/18 2355 10/30/18 0127 10/30/18 0339 10/30/18 1649 10/30/18  1826  TROPONINIHS 55* 58* 52* 31* 32*      Chemistry Recent Labs  Lab 10/29/18 2155  11/02/18 0503  11/04/18 0627 11/04/18 0958 11/05/18 0509  NA 138   < > 141   < > 140 141 140  K 4.4   < > 4.4   < > 4.3 3.8 3.8  CL 104   < > 106   < > 104 103 105  CO2 26   < > 25   < > 25 25 27   GLUCOSE 167*   < > 113*   < > 104* 112* 99  BUN 16   < > 18   < > 15 14 14   CREATININE 1.94*   < > 1.27*   < > 1.27* 1.30* 1.28*  CALCIUM 9.1   < > 9.0   < > 8.8* 9.1 8.7*  PROT 7.9  --   --   --   --   --   --   ALBUMIN 4.3  --  3.7  --   --   --   --   AST 31  --   --   --   --   --   --  ALT 27  --   --   --   --   --   --   ALKPHOS 100  --   --   --   --   --   --   BILITOT 1.1  --   --   --   --   --   --   GFRNONAA 35*   < > 58*   < > 58* 57* 58*  GFRAA 41*   < > >60   < > >60 >60 >60  ANIONGAP 8   < > 10   < > 11 13 8    < > = values in this interval not displayed.     Hematology Recent Labs  Lab 11/03/18 0604  11/03/18 1138 11/04/18 0627 11/05/18 0509  WBC 4.6  --   --  4.7 4.4  RBC 4.00*  --   --  3.89* 3.83*  HGB 12.7*   < > 12.2* 12.8* 12.6*  HCT 39.2   < > 36.0* 37.4* 36.6*  MCV 98.0  --   --  96.1 95.6  MCH 31.8  --   --  32.9 32.9  MCHC 32.4  --   --  34.2 34.4  RDW 13.5  --   --  13.6 13.2  PLT 182  --   --  166 164   < > = values in this interval not displayed.    BNP Recent Labs  Lab 10/29/18 2155  BNP 1,167.1*     DDimer No results for input(s): DDIMER in the last 168 hours.   Radiology    No results found.  Cardiac Studies   ECHO  1. The left ventricle has severely reduced systolic function, with an ejection fraction of 15-20%. The cavity size was moderately dilated. There is moderate concentric left ventricular hypertrophy. Left ventricular diastolic function could not be evaluated secondary to atrial fibrillation. Left ventricular diffuse hypokinesis. 2. The right ventricle has moderately reduced systolic function. The cavity was moderately  enlarged. There is no increase in right ventricular wall thickness. Right ventricular systolic pressure is moderately elevated with an estimated pressure of 45.5 mmHg. 3. Left atrial size was moderately dilated. 4. Right atrial size was severely dilated. 5. Tricuspid valve regurgitation is severe. 6. Mild thickening of the aortic valve. Aortic valve regurgitation is moderate by color flow Doppler. 7. The aorta is normal unless otherwise noted.  Cardiac Cath 11/03/2018 IMPRESSION:Glenn [name] has clean coronary arteries, elevated filling pressures consistent with a nonischemic cardiomyopathy with acute on chronic systolic heart failure. He is in A. fib with RVR. He will need pharmacologic optimization and initiation of diuretic therapy for LVEDP and elevated filling pressures. He already has an ICD in place.   Patient Profile     68 y.o. male with a hx of Afib with ICD,Chronic systolic Heart failure(EF 30-35% 2018),NICM (reported nonobstructive CAD on cath in [place]), noncompliance,hepatitis C, hypertension, h/o stroke, tobacco abuse,h/oofcocaine usewho is being seen for the evaluation of heart failure.  Assessment & Plan      CARDIOMYOPATHY with Acute on chronic systolic CHF s/p ICD -echo now with EF decline from 30-25% down to 15%  -reported nonobstructive dx on cath in the past in [place] -cath with normal cors and elevated filling pressures -started on IV Lasix 40mg  IV BID - Volume status -2.7 L since admission -weight down 5lbs -creatinine stable with diuresis at 1.28 -TSH normal -continue Carvedilol -Entresto 24-26mg  BID at discharge if renal function remains stable  post cath and with diuresis -spironolactone 12.5mg  daily was added -BMET in am >> will replete K+ and Mag - DCCV today  AKI on CKD Stage III: -diuretics were on hold over weekend for cath today but now back on Lasix 40mg  IV BID for increased filling pressures -creatinine improved .27 > 1.30>1.28   Persistent Afib -Patient had recent cardioversion 9/9  -Tikosyn held due prolonged QT which then resolved and now back on lower dose at 238mcg BID.  -EKG QTc 485 today. 437 yesterday >> monitor daily EKGs -remains in afib with RVR rates 110-120s -On highest dose of Carvedilol -may need to add lopressor -no CCB due to LV dysfunction -K+ 3.8  this am and Mag 1.9.  Will supplement -now back on Eliquis 5mg  BID -stop ASA -DCCV today  HTN -BP is controlledat 123/69 mmHg -continue Carvedilol 25mg  BID  Elevated troponin -mildly elevated at45>55>58>31>32 -likely related to CHF in the setting of CKD -no CAD on cath   H/o stroke - stroke in 2018 with no residual effects -Continue Eliquis   Dyslipidemia - HDL 26, LDL76, TG 48 - Recommend lifestyle changes    For questions or updates, please contact [facility] Please consult [email or web address] for contact info under        Signed, Jeffery Bachmeier Ninfa Meeker, PA-C  11/05/2018, 7:30 AM

## 2018-11-05 NOTE — Progress Notes (Signed)
Family Medicine Teaching Service Daily Progress Note Intern Pager: [phone]  Patient name: Glenn [name] Medical record number: [id number] Date of birth: [birth year] Age: 68 y.o. Gender: male  Primary Care Provider: [name], MD Consultants: Cardiology Code Status: Full  Pt Overview and Major Events to Date:  9/10- admitted for acute hypoxemic respiratory failure after cardiovesions 9/10- Inverted T waves concerning for acute ischemia -> heparin gtt 9/12- aPTT subtherapeutic switched to enoxaparin 9/13- Cardiology requested switch back to heparin gtt  9/14 - Left and right heart cath  9/16 - electrical cardioversion  Assessment and Plan: Glenn [name] is a 68 y.o. male presenting with respiratory distress, now c/f NSTEMI. PMH is significant for AFIB, NICM with ICD, HTN, left MCA CVA, Hep C, hx of cocaine and tobacco abuse.   Atrial fibrillation with RVR s/p cardioversion on 9/9 Acute CHF exacerbation Cardiomyopathy with HFreF (EF 15-20%)  Nonsustained V. Tach (resolved) Patient did not chemically convert and will have a cardioversion later today possible discharge tomorrow. Patient's Tikosyn at half his home dose. Patient not appear volume overloaded on exam.  No appreciable lower extremity edema.  Weight 202.1 lbs on admission and today 197  lbs.  Patient with 2.5 L diuresis. Continue to monitor daily EKGs for QT prolongation. Cardiology following and appreciate recommendations.  Lasix twice daily 40 mg.  Avoid IV fluids. - cardiology following, appreciate recs  -Continue to monitor QT interval with daily EKGs -Tikosyn,  250 mg BID -Eliquis 5mg  BID  -Coreg 25mg  BID  - Lasix 40 mg BID  -Spirolactone 12.5 mg daily  -NPO at midnight for cardioversion.  -Discontinue Cardizem  -STRICT I/Os -daily weights - Start Entresto at discharge if renal function stable - daily Mag and K - goal K4 and Mag 2, replete as needed  Acute kidney injury:  Improving.  Creatine on  admission 1.94 and today 1.28. Baseline 1.1-1.2.  - Avoid IVF as the patient is concurrently having a CHF exacerbation - daily BMPs    Hypertension BP normotensive overnight.  Home medication is Coreg 25 mg twice daily.   -Continue home medicications -Monitor blood pressures -We will consider statin prior to discharge  Constipation -continue miralax  Tobacco use disorder Patient endorses smoking 0.5 packs/day for the past 30-40 years.  -Encourage smoking cessation -Nicotine patch if needed  H/o CVA left MCA Stable. Patient had a stroke on 09/05/2016. No residual deficits.  Patient underwent chemical thrombectomy for left M2 occlusion.  Appears the patient previously was on aspirin 81 mg daily, however not recently on this. Unclear patient should be on this.  - Discontinue per cardiology 81 mg ASA recommendation for cardiology  H/o cocaine use disorder Patient with history of cocaine use. Possible contribution to acute SOB and heart failure. -UDS positive for benzodiazepines as patient is prescribed valium     Chronic Hep C s/p treatment Patient diagnosed in March 2019 likely due to history of drug use in the 1980s.  There is no personal or family history of liver disease.  Patient received treatment for hep C in April 2019  FEN/GI: heart healthy diet PPx: Eliquis   Disposition: Home pending medical clearance, likely tomorrow   Subjective:  There were no significant events overnight.  Patient denies any pain and no shortness of breath.  He is anxious about having cardioversion today as he had a very bad headache likely due to anesthesia at his last cardioversion on 9/9.  Objective: Temp:  [98.4 F (36.9 C)-98.9 F (37.2 C)]  98.9 F (37.2 C) (09/16 0331) Pulse Rate:  [48-105] 68 (09/16 0331) Resp:  [16-18] 16 (09/16 0331) BP: (104-135)/(67-99) 123/69 (09/16 0331) SpO2:  [95 %-99 %] 97 % (09/16 0331) Weight:  [89.4 kg] 89.4 kg (09/16 0536)    Physical Exam:  GEN:  Pleasant male, lying in bed listening to music CV: Irregularly irregular rhythm and regular rate, no murmurs appreciated  RESP: no increased work of breathing, clear to ascultation bilaterally with no crackles, wheezes, or rhonchi  ABD: Bowel sounds present. Soft, Nontender, Nondistended.  MSK: no appreciable lower extremity edema, or cyanosis noted SKIN: warm, dry NEURO: grossly normal, moves all extremities appropriately PSYCH: Normal affect and thought content      Laboratory: Recent Labs  Lab 11/03/18 0604  11/03/18 1138 11/04/18 0627 11/05/18 0509  WBC 4.6  --   --  4.7 4.4  HGB 12.7*   < > 12.2* 12.8* 12.6*  HCT 39.2   < > 36.0* 37.4* 36.6*  PLT 182  --   --  166 164   < > = values in this interval not displayed.   Recent Labs  Lab 10/29/18 2155  11/04/18 0627 11/04/18 0958 11/05/18 0509  NA 138   < > 140 141 140  K 4.4   < > 4.3 3.8 3.8  CL 104   < > 104 103 105  CO2 26   < > 25 25 27   BUN 16   < > 15 14 14   CREATININE 1.94*   < > 1.27* 1.30* 1.28*  CALCIUM 9.1   < > 8.8* 9.1 8.7*  PROT 7.9  --   --   --   --   BILITOT 1.1  --   --   --   --   ALKPHOS 100  --   --   --   --   ALT 27  --   --   --   --   AST 31  --   --   --   --   GLUCOSE 167*   < > 104* 112* 99   < > = values in this interval not displayed.    Imaging/Diagnostic Tests: Lyndee Hensen, DO PGY-1, [facility] 11/05/2018 7:59 AM    FPTS Intern pager: [phone], text pages welcome

## 2018-11-06 ENCOUNTER — Other Ambulatory Visit: Payer: Self-pay | Admitting: Medical

## 2018-11-06 ENCOUNTER — Encounter (HOSPITAL_COMMUNITY): Payer: Self-pay | Admitting: Cardiovascular Disease

## 2018-11-06 DIAGNOSIS — I472 Ventricular tachycardia: Secondary | ICD-10-CM

## 2018-11-06 DIAGNOSIS — I4819 Other persistent atrial fibrillation: Secondary | ICD-10-CM

## 2018-11-06 DIAGNOSIS — R000 Tachycardia, unspecified: Secondary | ICD-10-CM

## 2018-11-06 DIAGNOSIS — I420 Dilated cardiomyopathy: Secondary | ICD-10-CM

## 2018-11-06 LAB — CBC WITH DIFFERENTIAL/PLATELET
Abs Immature Granulocytes: 0.01 10*3/uL (ref 0.00–0.07)
Basophils Absolute: 0 10*3/uL (ref 0.0–0.1)
Basophils Relative: 0 %
Eosinophils Absolute: 0.5 10*3/uL (ref 0.0–0.5)
Eosinophils Relative: 10 %
HCT: 36.4 % — ABNORMAL LOW (ref 39.0–52.0)
Hemoglobin: 11.8 g/dL — ABNORMAL LOW (ref 13.0–17.0)
Immature Granulocytes: 0 %
Lymphocytes Relative: 38 %
Lymphs Abs: 1.9 10*3/uL (ref 0.7–4.0)
MCH: 32.2 pg (ref 26.0–34.0)
MCHC: 32.4 g/dL (ref 30.0–36.0)
MCV: 99.2 fL (ref 80.0–100.0)
Monocytes Absolute: 0.6 10*3/uL (ref 0.1–1.0)
Monocytes Relative: 12 %
Neutro Abs: 2 10*3/uL (ref 1.7–7.7)
Neutrophils Relative %: 40 %
Platelets: 176 10*3/uL (ref 150–400)
RBC: 3.67 MIL/uL — ABNORMAL LOW (ref 4.22–5.81)
RDW: 13.5 % (ref 11.5–15.5)
WBC: 5 10*3/uL (ref 4.0–10.5)
nRBC: 0 % (ref 0.0–0.2)

## 2018-11-06 LAB — BASIC METABOLIC PANEL
Anion gap: 10 (ref 5–15)
BUN: 15 mg/dL (ref 8–23)
CO2: 28 mmol/L (ref 22–32)
Calcium: 8.9 mg/dL (ref 8.9–10.3)
Chloride: 99 mmol/L (ref 98–111)
Creatinine, Ser: 1.48 mg/dL — ABNORMAL HIGH (ref 0.61–1.24)
GFR calc Af Amer: 56 mL/min — ABNORMAL LOW (ref >60)
GFR calc non Af Amer: 49 mL/min — ABNORMAL LOW (ref >60)
Glucose, Bld: 124 mg/dL — ABNORMAL HIGH (ref 70–99)
Potassium: 3.9 mmol/L (ref 3.5–5.1)
Sodium: 137 mmol/L (ref 135–145)

## 2018-11-06 LAB — MAGNESIUM: Magnesium: 2 mg/dL (ref 1.7–2.4)

## 2018-11-06 MED ORDER — DOFETILIDE 250 MCG PO CAPS: 250.0000 ug | ORAL_CAPSULE | Freq: Two times a day (BID) | ORAL | 0 refills | Status: DC

## 2018-11-06 MED ORDER — FUROSEMIDE 40 MG PO TABS: 40.0000 mg | ORAL_TABLET | Freq: Every day | ORAL | 0 refills | Status: DC

## 2018-11-06 MED ORDER — APIXABAN 5 MG PO TABS: 5.0000 mg | ORAL_TABLET | Freq: Two times a day (BID) | ORAL | 0 refills | Status: DC

## 2018-11-06 MED ORDER — MAVYRET 100-40 MG PO TABS: 3.0000 | ORAL_TABLET | Freq: Every day | ORAL | 0 refills | Status: DC

## 2018-11-06 MED ORDER — POTASSIUM CHLORIDE CRYS ER 20 MEQ PO TBCR
40.0000 meq | EXTENDED_RELEASE_TABLET | Freq: Once | ORAL | Status: AC
  Administered 2018-11-06: 40 meq via ORAL
  Filled 2018-11-06: qty 2

## 2018-11-06 MED ORDER — SACUBITRIL-VALSARTAN 24-26 MG PO TABS: 1.0000 | ORAL_TABLET | Freq: Two times a day (BID) | ORAL | 0 refills | Status: DC

## 2018-11-06 MED ORDER — CARVEDILOL 25 MG PO TABS: 25.0000 mg | ORAL_TABLET | Freq: Two times a day (BID) | ORAL | 0 refills | Status: DC

## 2018-11-06 MED ORDER — POTASSIUM CHLORIDE CRYS ER 20 MEQ PO TBCR: 20.0000 meq | EXTENDED_RELEASE_TABLET | Freq: Every day | ORAL | Status: DC

## 2018-11-06 MED ORDER — POTASSIUM CHLORIDE CRYS ER 20 MEQ PO TBCR: 20.0000 meq | EXTENDED_RELEASE_TABLET | Freq: Every day | ORAL | 0 refills | Status: DC

## 2018-11-06 MED ORDER — FUROSEMIDE 40 MG PO TABS: 40.0000 mg | ORAL_TABLET | Freq: Every day | ORAL | Status: DC

## 2018-11-06 MED ORDER — SPIRONOLACTONE 25 MG PO TABS: 12.5000 mg | ORAL_TABLET | Freq: Every day | ORAL | 0 refills | Status: DC

## 2018-11-06 MED ORDER — SACUBITRIL-VALSARTAN 24-26 MG PO TABS
1.0000 | ORAL_TABLET | Freq: Two times a day (BID) | ORAL | Status: DC
  Administered 2018-11-06: 1 via ORAL
  Filled 2018-11-06: qty 1

## 2018-11-06 MED FILL — ENTRESTO 24 MG-26 MG TABLET: 24-26 | 30 days supply | Qty: 60 | Fill #0

## 2018-11-06 MED FILL — POTASSIUM CL ER 20 MEQ TABL: 20 | 30 days supply | Qty: 30 | Fill #0

## 2018-11-06 MED FILL — SPIRONOLACTONE 25 MG TABLET: 25 | 30 days supply | Qty: 15 | Fill #0

## 2018-11-06 MED FILL — DOFETILIDE 250 MCG CAPS: 250 | 30 days supply | Qty: 60 | Fill #0

## 2018-11-06 MED FILL — FUROSEMIDE 40 MG TABLET: 40 | 30 days supply | Qty: 30 | Fill #0

## 2018-11-06 NOTE — Progress Notes (Signed)
Per Dr. Radford Pax recommendations we have arranged the following: Stat labs Monday morning (BMET and Mag) at [address] office so that they are available for afternoon visit at the [facility].  New patient appointment also arranged for f/u at advanced heart failure clinic 9/30.

## 2018-11-06 NOTE — TOC Benefit Eligibility Note (Signed)
Transition of Care [facility]) Benefit Eligibility Note    Patient Details  Name: Glenn [name] MRN: [id number] Date of Birth: 1950-04-26   Medication/Dose: Delene Loll 24-58m bid  Covered?: Yes  Tier: 3 Drug  Prescription Coverage Preferred Pharmacy: Any retail Pharmacy  Spoke with Person/Company/Phone Number:: KVerline Lema Co-Pay: 3.90  Prior Approval: No  Deductible: Met(310.00)       IOrbie PyoPhone Number: 11/06/2018, 1:43 PM

## 2018-11-06 NOTE — TOC Progression Note (Signed)
Transition of Care [facility]) - Progression Note    Patient Details  Name: Glenn [name] MRN: [id number] Date of Birth: 10-28-1950  [facility]) CM/SW Contact  Zenon Mayo, RN Phone Number: 11/06/2018, 1:37 PM  Clinical Narrative:    Patient is for dc today, he will be on eliquis and tikyson, [facility] pharmacy is filling his medications for him today.  He will need cab voucher at dc.  NCM gave voucher to secretary on unit.        Expected Discharge Plan and Services           Expected Discharge Date: 11/06/18                                     Social Determinants of Health (SDOH) Interventions    Readmission Risk Interventions No flowsheet data found.

## 2018-11-06 NOTE — Progress Notes (Signed)
Progress Note  Patient Name: Glenn [name] Date of Encounter: 11/06/2018  Primary Cardiologist: Virl Axe, MD   Subjective   Patient is feeling good this morning. No chest pain or sob. Maintaining NSR. Possible discharge today.  Inpatient Medications    Scheduled Meds: . apixaban  5 mg Oral BID  . carvedilol  25 mg Oral BID WC  . dofetilide  250 mcg Oral BID  . furosemide  40 mg Oral BID  . gabapentin  300 mg Oral TID  . polyethylene glycol  17 g Oral Daily  . potassium chloride  40 mEq Oral Once  . sodium chloride flush  3 mL Intravenous Q12H  . sodium chloride flush  3 mL Intravenous Q12H  . spironolactone  12.5 mg Oral Daily  . vitamin B-12  500 mcg Oral Daily  . vitamin C  500 mg Oral Daily   Continuous Infusions: . sodium chloride 250 mL (11/05/18 1408)   PRN Meds: sodium chloride, acetaminophen **OR** acetaminophen, albuterol, diazepam, ipratropium-albuterol, sodium chloride flush   Vital Signs    Vitals:   11/05/18 1505 11/05/18 1523 11/05/18 2023 11/06/18 0424  BP: 111/72 112/72 121/67 127/81  Pulse: 70 70 76 69  Resp: (!) 21  18 16   Temp:   98.2 F (36.8 C) 97.9 F (36.6 C)  TempSrc:   Oral Oral  SpO2: 97% 98% 97% 100%  Weight:    88.7 kg  Height:        Intake/Output Summary (Last 24 hours) at 11/06/2018 0731 Last data filed at 11/06/2018 0600 Gross per 24 hour  Intake 820 ml  Output 1900 ml  Net -1080 ml   Last 3 Weights 11/06/2018 11/05/2018 11/04/2018  Weight (lbs) 195 lb 8 oz 197 lb 201 lb 12.8 oz  Weight (kg) 88.678 kg 89.359 kg 91.536 kg      Telemetry    NSR, HR 60s, occasional PVCs; 12beat run of Vtach 6:42 this AM - Personally Reviewed  ECG    Pending - Personally Reviewed  Physical Exam   GEN: No acute distress.   Neck: No JVD Cardiac: RRR, no murmurs, rubs, or gallops.  Respiratory: Clear to auscultation bilaterally. GI: Soft, nontender, non-distended  MS: No edema; No deformity. Neuro:  Nonfocal  Psych: Normal  affect   Labs    High Sensitivity Troponin:   Recent Labs  Lab 10/29/18 2355 10/30/18 0127 10/30/18 0339 10/30/18 1649 10/30/18 1826  TROPONINIHS 55* 58* 52* 31* 32*      Chemistry Recent Labs  Lab 11/02/18 0503  11/04/18 0958 11/05/18 0509 11/06/18 0529  NA 141   < > 141 140 137  K 4.4   < > 3.8 3.8 3.9  CL 106   < > 103 105 99  CO2 25   < > 25 27 28   GLUCOSE 113*   < > 112* 99 124*  BUN 18   < > 14 14 15   CREATININE 1.27*   < > 1.30* 1.28* 1.48*  CALCIUM 9.0   < > 9.1 8.7* 8.9  ALBUMIN 3.7  --   --   --   --   GFRNONAA 58*   < > 57* 58* 49*  GFRAA >60   < > >60 >60 56*  ANIONGAP 10   < > 13 8 10    < > = values in this interval not displayed.     Hematology Recent Labs  Lab 11/04/18 0627 11/05/18 0509 11/06/18 0529  WBC 4.7 4.4 5.0  RBC 3.89* 3.83* 3.67*  HGB 12.8* 12.6* 11.8*  HCT 37.4* 36.6* 36.4*  MCV 96.1 95.6 99.2  MCH 32.9 32.9 32.2  MCHC 34.2 34.4 32.4  RDW 13.6 13.2 13.5  PLT 166 164 176    BNPNo results for input(s): BNP, PROBNP in the last 168 hours.   DDimer No results for input(s): DDIMER in the last 168 hours.   Radiology    No results found.  Cardiac Studies    ECHO 10/30/18  1. The left ventricle has severely reduced systolic function, with an ejection fraction of 15-20%. The cavity size was moderately dilated. There is moderate concentric left ventricular hypertrophy. Left ventricular diastolic function could not be evaluated secondary to atrial fibrillation. Left ventricular diffuse hypokinesis. 2. The right ventricle has moderately reduced systolic function. The cavity was moderately enlarged. There is no increase in right ventricular wall thickness. Right ventricular systolic pressure is moderately elevated with an estimated pressure of 45.5 mmHg. 3. Left atrial size was moderately dilated. 4. Right atrial size was severely dilated. 5. Tricuspid valve regurgitation is severe. 6. Mild thickening of the aortic valve.  Aortic valve regurgitation is moderate by color flow Doppler. 7. The aorta is normal unless otherwise noted.  Cardiac Cath 11/03/2018 IMPRESSION:Mr. Leyh has clean coronary arteries, elevated filling pressures consistent with a nonischemic cardiomyopathy with acute on chronic systolic heart failure. He is in A. fib with RVR. He will need pharmacologic optimization and initiation of diuretic therapy for LVEDP and elevated filling pressures. He already has an ICD in place.  Patient Profile     69 y.o. male with a hx of Afib with ICD,Chronic systolic Heart failure(EF 30-35% 2018),NICM (reported nonobstructive CAD on cath in [place]), noncompliance,hepatitis C, hypertension, h/o stroke, tobacco abuse,h/oofcocaine usewho is being seen for the evaluation of heart failure.  Assessment & Plan    CARDIOMYOPATHY with Acute on chronic systolic CHF s/p ICD -echo now with EF decline from 30-25% down to 15%  -reported nonobstructive dx on cath in the past in [place] -cath this admission with normal cors and elevated filling pressures -started on IV Lasix 40mg  IV BID >> now on PO Lasix 40 mg BID - Volume status -3.7 L since admission -weight down 7 lbs -creatinineslightly up from yesterday 1.28 > 1.48. Will hold AM Lasix>> MD to see -continue Carvedilol -Entresto 24-26mg  BID at discharge if renal function remains stable post cath and with diuresis - Continue spironolactone 12.5mg   -K+ 3.9 and Mag 2.0 - DCCV yesterday was successful>>maintaining NSR  AKI on CKD Stage III: -Switched to PO lasix yesterday. Would hold AM dose for slightly worse creatinine, 1.28 > 1.48  Persistent Afib>> Now in NSR -Patient had recent cardioversion 9/9  -Tikosynwas held dueprolonged QT which then resolved and now back on lower dose at 211mcg BID.  -EKG QTc 485 yesterday >> pending EKG Today -DCCV was successful yesterday and patient is maintaining NSR, rates in the 60s -On highest dose of  Carvedilol -no CCB due to LV dysfunction -K+ 3.9  this am and Mag 2.0.  -now back on Eliquis 5mg  BID -stop ASA  HTN -BP is controlledat 127/81 mmHg -continue Carvedilol 25mg  BID  Elevated troponin -mildly elevated at45>55>58>31>32 -likely related to CHF in the setting of CKD -no CAD on cath   H/o stroke - stroke in 2018 with no residual effects -Continue Eliquis   Dyslipidemia - HDL 26, LDL76, TG 48   For questions or updates, please contact [facility] Please consult [email or web address] for contact  info under        Signed, Slate Debroux Ninfa Meeker, PA-C  11/06/2018, 7:31 AM

## 2018-11-06 NOTE — Evaluation (Addendum)
Physical Therapy Evaluation/ Discharge Patient Details Name: Glenn [name] MRN: [id number] DOB: Jul 11, 1950 Today's Date: 11/06/2018   History of Present Illness  Pt is a 68 y.o. male presenting to ED 10/29/18 with respiratory distress after having elective cardioversion earlier that day. S/p heart cath 9/14. S/p cardioversion 9/16. PMH includes afib, NICM with ICD, HTN, L MCA CVA, Hep C, h/o substance abuse.  Clinical Impression  Pt able to perform iADLs at sink, walk down hall, up stairs and perform all activity with HR 84-97, no CP or SOB. PT independent with all mobility, no report of falls and no further needs at this time with pt at baseline functional level. PT educated for energy conservation and walking program as needed. No further needs and will sign off with pt aware and agreeable.      Follow Up Recommendations No PT follow up    Equipment Recommendations  None recommended by PT    Recommendations for Other Services       Precautions / Restrictions Precautions Precautions: None      Mobility  Bed Mobility Overal bed mobility: Modified Independent                Transfers Overall transfer level: Modified independent                  Ambulation/Gait Ambulation/Gait assistance: Independent Gait Distance (Feet): 500 Feet Assistive device: None Gait Pattern/deviations: WFL(Within Functional Limits)   Gait velocity interpretation: >4.37 ft/sec, indicative of normal walking speed General Gait Details: good stability, gait and no SOB, HR 97  Stairs Stairs: Yes Stairs assistance: Independent Stair Management: Forwards;Alternating pattern;No rails Number of Stairs: 6    Wheelchair Mobility    Modified Rankin (Stroke Patients Only)       Balance Overall balance assessment: No apparent balance deficits (not formally assessed)                                           Pertinent Vitals/Pain Pain Assessment: No/denies pain     Home Living Family/patient expects to be discharged to:: Private residence Living Arrangements: Non-relatives/Friends Available Help at Discharge: Friend(s);Available PRN/intermittently Type of Home: House Home Access: Stairs to enter   [employer] of Steps: 5 Home Layout: One level Home Equipment: None      Prior Function Level of Independence: Independent         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        Extremity/Trunk Assessment   Upper Extremity Assessment Upper Extremity Assessment: Overall WFL for tasks assessed    Lower Extremity Assessment Lower Extremity Assessment: Overall WFL for tasks assessed    Cervical / Trunk Assessment Cervical / Trunk Assessment: Normal  Communication   Communication: No difficulties  Cognition Arousal/Alertness: Awake/alert Behavior During Therapy: WFL for tasks assessed/performed Overall Cognitive Status: Within Functional Limits for tasks assessed                                        General Comments      Exercises     Assessment/Plan    PT Assessment Patent does not need any further PT services  PT Problem List         PT Treatment Interventions      PT  Goals (Current goals can be found in the Care Plan section)  Acute Rehab PT Goals PT Goal Formulation: All assessment and education complete, DC therapy    Frequency     Barriers to discharge        Co-evaluation               AM-PAC PT "6 Clicks" Mobility  Outcome Measure Help needed turning from your back to your side while in a flat bed without using bedrails?: None Help needed moving from lying on your back to sitting on the side of a flat bed without using bedrails?: None Help needed moving to and from a bed to a chair (including a wheelchair)?: None Help needed standing up from a chair using your arms (e.g., wheelchair or bedside chair)?: None Help needed to walk in hospital room?: None Help  needed climbing 3-5 steps with a railing? : None 6 Click Score: 24    End of Session   Activity Tolerance: Patient tolerated treatment well Patient left: in bed;with call bell/phone within reach Nurse Communication: Mobility status PT Visit Diagnosis: Other abnormalities of gait and mobility (R26.89)    [id number] PT Time Calculation (min) (ACUTE ONLY): 14 min   Charges:   PT Evaluation $PT Eval Low Complexity: [place], PT Acute Rehabilitation Services Pager: [phone] Office: [phone]   Dmetrius Ambs B [place] 11/06/2018, 10:39 AM

## 2018-11-06 NOTE — Discharge Instructions (Signed)
You were treated for heart failure with atrial fibrillation during this hospitalization.  We are sending many important medications home with you today, including carvedilol 25mg  twice daily, Entresto 24-26mg  twice daily, Lasix 40mg  daily (start tomorrow 9/18), Arlyce Harman 12.5mg  daily, Kdur 83meq daily, Eliquis 5mg  twice daily, Tikosyn 273mcg twice daily.  Please also take your other chronic medications including your hepatitis C medicine.  Please follow-up with your primary doctor Dr. Kris Mouton at the [facility] and with the cardiologist.  These appointments are listed in this paperwork.  Thank you for allowing [place] to take care of you and we hope that you continue to feel better.

## 2018-11-07 ENCOUNTER — Ambulatory Visit (INDEPENDENT_AMBULATORY_CARE_PROVIDER_SITE_OTHER): Payer: Medicare HMO | Admitting: *Deleted

## 2018-11-07 DIAGNOSIS — I255 Ischemic cardiomyopathy: Secondary | ICD-10-CM

## 2018-11-09 LAB — CUP PACEART REMOTE DEVICE CHECK
Battery Remaining Longevity: 24 mo
Battery Remaining Percentage: 46 %
Brady Statistic RV Percent Paced: 1 %
Date Time Interrogation Session: 20200919130900
HighPow Impedance: 39 Ohm
Implantable Lead Implant Date: 20151113
Implantable Lead Location: 753860
Implantable Lead Model: 295
Implantable Lead Serial Number: 135220
Implantable Pulse Generator Implant Date: 20151113
Lead Channel Impedance Value: 691 Ohm
Lead Channel Pacing Threshold Amplitude: 0.6 V
Lead Channel Pacing Threshold Pulse Width: 0.5 ms
Lead Channel Setting Pacing Amplitude: 2.5 V
Lead Channel Setting Pacing Pulse Width: 0.5 ms
Lead Channel Setting Sensing Sensitivity: 0.5 mV
Pulse Gen Serial Number: 104417

## 2018-11-10 ENCOUNTER — Other Ambulatory Visit: Payer: Self-pay

## 2018-11-10 ENCOUNTER — Inpatient Hospital Stay (HOSPITAL_COMMUNITY)
Admission: EM | Admit: 2018-11-10 | Discharge: 2018-11-13 | DRG: 064 | Disposition: A | Discharge status: Rehabilitation Facility | Payer: Medicare HMO | Attending: Neurology | Admitting: Neurology

## 2018-11-10 ENCOUNTER — Other Ambulatory Visit: Payer: Medicare HMO

## 2018-11-10 ENCOUNTER — Emergency Department (HOSPITAL_COMMUNITY): Payer: Medicare HMO

## 2018-11-10 ENCOUNTER — Inpatient Hospital Stay (HOSPITAL_COMMUNITY): Payer: Medicare HMO

## 2018-11-10 ENCOUNTER — Encounter: Payer: Self-pay | Admitting: Cardiology

## 2018-11-10 DIAGNOSIS — I69354 Hemiplegia and hemiparesis following cerebral infarction affecting left non-dominant side: Secondary | ICD-10-CM | POA: Diagnosis not present

## 2018-11-10 DIAGNOSIS — I63412 Cerebral infarction due to embolism of left middle cerebral artery: Principal | ICD-10-CM | POA: Diagnosis present

## 2018-11-10 DIAGNOSIS — I69391 Dysphagia following cerebral infarction: Secondary | ICD-10-CM

## 2018-11-10 DIAGNOSIS — N179 Acute kidney failure, unspecified: Secondary | ICD-10-CM | POA: Diagnosis present

## 2018-11-10 DIAGNOSIS — I615 Nontraumatic intracerebral hemorrhage, intraventricular: Secondary | ICD-10-CM | POA: Diagnosis not present

## 2018-11-10 DIAGNOSIS — I428 Other cardiomyopathies: Secondary | ICD-10-CM | POA: Diagnosis not present

## 2018-11-10 DIAGNOSIS — R7309 Other abnormal glucose: Secondary | ICD-10-CM | POA: Diagnosis not present

## 2018-11-10 DIAGNOSIS — G8194 Hemiplegia, unspecified affecting left nondominant side: Secondary | ICD-10-CM | POA: Diagnosis not present

## 2018-11-10 DIAGNOSIS — N1831 Chronic kidney disease, stage 3a: Secondary | ICD-10-CM | POA: Diagnosis not present

## 2018-11-10 DIAGNOSIS — I5043 Acute on chronic combined systolic (congestive) and diastolic (congestive) heart failure: Secondary | ICD-10-CM | POA: Diagnosis present

## 2018-11-10 DIAGNOSIS — I6523 Occlusion and stenosis of bilateral carotid arteries: Secondary | ICD-10-CM | POA: Diagnosis present

## 2018-11-10 DIAGNOSIS — R29810 Facial weakness: Secondary | ICD-10-CM | POA: Diagnosis present

## 2018-11-10 DIAGNOSIS — M25551 Pain in right hip: Secondary | ICD-10-CM | POA: Diagnosis present

## 2018-11-10 DIAGNOSIS — R509 Fever, unspecified: Secondary | ICD-10-CM | POA: Diagnosis not present

## 2018-11-10 DIAGNOSIS — I509 Heart failure, unspecified: Secondary | ICD-10-CM

## 2018-11-10 DIAGNOSIS — I4819 Other persistent atrial fibrillation: Secondary | ICD-10-CM | POA: Diagnosis not present

## 2018-11-10 DIAGNOSIS — D62 Acute posthemorrhagic anemia: Secondary | ICD-10-CM | POA: Diagnosis not present

## 2018-11-10 DIAGNOSIS — I4821 Permanent atrial fibrillation: Secondary | ICD-10-CM | POA: Diagnosis not present

## 2018-11-10 DIAGNOSIS — B182 Chronic viral hepatitis C: Secondary | ICD-10-CM | POA: Diagnosis present

## 2018-11-10 DIAGNOSIS — G936 Cerebral edema: Secondary | ICD-10-CM | POA: Diagnosis present

## 2018-11-10 DIAGNOSIS — R519 Headache, unspecified: Secondary | ICD-10-CM | POA: Diagnosis not present

## 2018-11-10 DIAGNOSIS — I5033 Acute on chronic diastolic (congestive) heart failure: Secondary | ICD-10-CM | POA: Diagnosis not present

## 2018-11-10 DIAGNOSIS — Z7982 Long term (current) use of aspirin: Secondary | ICD-10-CM | POA: Diagnosis not present

## 2018-11-10 DIAGNOSIS — R471 Dysarthria and anarthria: Secondary | ICD-10-CM | POA: Diagnosis present

## 2018-11-10 DIAGNOSIS — Z8673 Personal history of transient ischemic attack (TIA), and cerebral infarction without residual deficits: Secondary | ICD-10-CM

## 2018-11-10 DIAGNOSIS — R531 Weakness: Secondary | ICD-10-CM | POA: Diagnosis not present

## 2018-11-10 DIAGNOSIS — Z79899 Other long term (current) drug therapy: Secondary | ICD-10-CM | POA: Diagnosis not present

## 2018-11-10 DIAGNOSIS — R4781 Slurred speech: Secondary | ICD-10-CM | POA: Diagnosis not present

## 2018-11-10 DIAGNOSIS — I110 Hypertensive heart disease with heart failure: Secondary | ICD-10-CM | POA: Diagnosis present

## 2018-11-10 DIAGNOSIS — F1290 Cannabis use, unspecified, uncomplicated: Secondary | ICD-10-CM | POA: Diagnosis not present

## 2018-11-10 DIAGNOSIS — R414 Neurologic neglect syndrome: Secondary | ICD-10-CM | POA: Diagnosis not present

## 2018-11-10 DIAGNOSIS — I4891 Unspecified atrial fibrillation: Secondary | ICD-10-CM | POA: Diagnosis not present

## 2018-11-10 DIAGNOSIS — J449 Chronic obstructive pulmonary disease, unspecified: Secondary | ICD-10-CM | POA: Diagnosis present

## 2018-11-10 DIAGNOSIS — I63311 Cerebral infarction due to thrombosis of right middle cerebral artery: Secondary | ICD-10-CM | POA: Diagnosis not present

## 2018-11-10 DIAGNOSIS — Z823 Family history of stroke: Secondary | ICD-10-CM

## 2018-11-10 DIAGNOSIS — R1312 Dysphagia, oropharyngeal phase: Secondary | ICD-10-CM | POA: Diagnosis not present

## 2018-11-10 DIAGNOSIS — I63411 Cerebral infarction due to embolism of right middle cerebral artery: Secondary | ICD-10-CM | POA: Diagnosis not present

## 2018-11-10 DIAGNOSIS — I255 Ischemic cardiomyopathy: Secondary | ICD-10-CM | POA: Diagnosis not present

## 2018-11-10 DIAGNOSIS — Z95810 Presence of automatic (implantable) cardiac defibrillator: Secondary | ICD-10-CM | POA: Diagnosis not present

## 2018-11-10 DIAGNOSIS — I629 Nontraumatic intracranial hemorrhage, unspecified: Secondary | ICD-10-CM | POA: Diagnosis not present

## 2018-11-10 DIAGNOSIS — I5042 Chronic combined systolic (congestive) and diastolic (congestive) heart failure: Secondary | ICD-10-CM | POA: Diagnosis not present

## 2018-11-10 DIAGNOSIS — E785 Hyperlipidemia, unspecified: Secondary | ICD-10-CM | POA: Diagnosis not present

## 2018-11-10 DIAGNOSIS — I082 Rheumatic disorders of both aortic and tricuspid valves: Secondary | ICD-10-CM | POA: Diagnosis present

## 2018-11-10 DIAGNOSIS — I63511 Cerebral infarction due to unspecified occlusion or stenosis of right middle cerebral artery: Secondary | ICD-10-CM | POA: Diagnosis not present

## 2018-11-10 DIAGNOSIS — R51 Headache: Secondary | ICD-10-CM | POA: Diagnosis not present

## 2018-11-10 DIAGNOSIS — R29712 NIHSS score 12: Secondary | ICD-10-CM | POA: Diagnosis present

## 2018-11-10 DIAGNOSIS — B1920 Unspecified viral hepatitis C without hepatic coma: Secondary | ICD-10-CM | POA: Diagnosis present

## 2018-11-10 DIAGNOSIS — I618 Other nontraumatic intracerebral hemorrhage: Secondary | ICD-10-CM | POA: Diagnosis not present

## 2018-11-10 DIAGNOSIS — I63031 Cerebral infarction due to thrombosis of right carotid artery: Secondary | ICD-10-CM | POA: Diagnosis not present

## 2018-11-10 DIAGNOSIS — Z9119 Patient's noncompliance with other medical treatment and regimen: Secondary | ICD-10-CM | POA: Diagnosis not present

## 2018-11-10 DIAGNOSIS — R1310 Dysphagia, unspecified: Secondary | ICD-10-CM | POA: Diagnosis present

## 2018-11-10 DIAGNOSIS — I10 Essential (primary) hypertension: Secondary | ICD-10-CM | POA: Diagnosis not present

## 2018-11-10 DIAGNOSIS — G459 Transient cerebral ischemic attack, unspecified: Secondary | ICD-10-CM | POA: Diagnosis not present

## 2018-11-10 DIAGNOSIS — Z720 Tobacco use: Secondary | ICD-10-CM | POA: Diagnosis present

## 2018-11-10 DIAGNOSIS — I5023 Acute on chronic systolic (congestive) heart failure: Secondary | ICD-10-CM | POA: Diagnosis not present

## 2018-11-10 DIAGNOSIS — I6521 Occlusion and stenosis of right carotid artery: Secondary | ICD-10-CM | POA: Diagnosis not present

## 2018-11-10 DIAGNOSIS — R0902 Hypoxemia: Secondary | ICD-10-CM | POA: Diagnosis not present

## 2018-11-10 DIAGNOSIS — I4892 Unspecified atrial flutter: Secondary | ICD-10-CM | POA: Diagnosis present

## 2018-11-10 DIAGNOSIS — F17200 Nicotine dependence, unspecified, uncomplicated: Secondary | ICD-10-CM | POA: Diagnosis not present

## 2018-11-10 DIAGNOSIS — Z7901 Long term (current) use of anticoagulants: Secondary | ICD-10-CM | POA: Diagnosis not present

## 2018-11-10 DIAGNOSIS — K5900 Constipation, unspecified: Secondary | ICD-10-CM | POA: Diagnosis present

## 2018-11-10 DIAGNOSIS — R05 Cough: Secondary | ICD-10-CM | POA: Diagnosis not present

## 2018-11-10 DIAGNOSIS — G939 Disorder of brain, unspecified: Secondary | ICD-10-CM | POA: Diagnosis not present

## 2018-11-10 DIAGNOSIS — I472 Ventricular tachycardia: Secondary | ICD-10-CM | POA: Diagnosis present

## 2018-11-10 DIAGNOSIS — R29714 NIHSS score 14: Secondary | ICD-10-CM | POA: Diagnosis not present

## 2018-11-10 DIAGNOSIS — F17210 Nicotine dependence, cigarettes, uncomplicated: Secondary | ICD-10-CM | POA: Diagnosis present

## 2018-11-10 DIAGNOSIS — I639 Cerebral infarction, unspecified: Secondary | ICD-10-CM

## 2018-11-10 DIAGNOSIS — I272 Pulmonary hypertension, unspecified: Secondary | ICD-10-CM | POA: Diagnosis present

## 2018-11-10 DIAGNOSIS — M7061 Trochanteric bursitis, right hip: Secondary | ICD-10-CM | POA: Diagnosis not present

## 2018-11-10 DIAGNOSIS — Z20828 Contact with and (suspected) exposure to other viral communicable diseases: Secondary | ICD-10-CM | POA: Diagnosis present

## 2018-11-10 DIAGNOSIS — I5021 Acute systolic (congestive) heart failure: Secondary | ICD-10-CM | POA: Diagnosis not present

## 2018-11-10 DIAGNOSIS — R109 Unspecified abdominal pain: Secondary | ICD-10-CM | POA: Diagnosis not present

## 2018-11-10 DIAGNOSIS — I69392 Facial weakness following cerebral infarction: Secondary | ICD-10-CM | POA: Diagnosis not present

## 2018-11-10 DIAGNOSIS — G8929 Other chronic pain: Secondary | ICD-10-CM | POA: Diagnosis not present

## 2018-11-10 DIAGNOSIS — I6389 Other cerebral infarction: Secondary | ICD-10-CM | POA: Diagnosis not present

## 2018-11-10 DIAGNOSIS — N319 Neuromuscular dysfunction of bladder, unspecified: Secondary | ICD-10-CM | POA: Diagnosis present

## 2018-11-10 DIAGNOSIS — R0682 Tachypnea, not elsewhere classified: Secondary | ICD-10-CM | POA: Diagnosis not present

## 2018-11-10 DIAGNOSIS — I2510 Atherosclerotic heart disease of native coronary artery without angina pectoris: Secondary | ICD-10-CM | POA: Diagnosis present

## 2018-11-10 DIAGNOSIS — Z91199 Patient's noncompliance with other medical treatment and regimen due to unspecified reason: Secondary | ICD-10-CM

## 2018-11-10 LAB — DIFFERENTIAL
Abs Immature Granulocytes: 0.03 10*3/uL (ref 0.00–0.07)
Basophils Absolute: 0 10*3/uL (ref 0.0–0.1)
Basophils Relative: 0 %
Eosinophils Absolute: 0 10*3/uL (ref 0.0–0.5)
Eosinophils Relative: 0 %
Immature Granulocytes: 0 %
Lymphocytes Relative: 14 %
Lymphs Abs: 1.5 10*3/uL (ref 0.7–4.0)
Monocytes Absolute: 0.8 10*3/uL (ref 0.1–1.0)
Monocytes Relative: 8 %
Neutro Abs: 8.1 10*3/uL — ABNORMAL HIGH (ref 1.7–7.7)
Neutrophils Relative %: 78 %

## 2018-11-10 LAB — CBC
HCT: 47.6 % (ref 39.0–52.0)
Hemoglobin: 16.4 g/dL (ref 13.0–17.0)
MCH: 32.5 pg (ref 26.0–34.0)
MCHC: 34.5 g/dL (ref 30.0–36.0)
MCV: 94.3 fL (ref 80.0–100.0)
Platelets: 201 10*3/uL (ref 150–400)
RBC: 5.05 MIL/uL (ref 4.22–5.81)
RDW: 12.9 % (ref 11.5–15.5)
WBC: 10.5 10*3/uL (ref 4.0–10.5)
nRBC: 0 % (ref 0.0–0.2)

## 2018-11-10 LAB — COMPREHENSIVE METABOLIC PANEL
ALT: 21 U/L (ref 0–44)
AST: 26 U/L (ref 15–41)
Albumin: 4.4 g/dL (ref 3.5–5.0)
Alkaline Phosphatase: 94 U/L (ref 38–126)
Anion gap: 13 (ref 5–15)
BUN: 15 mg/dL (ref 8–23)
CO2: 21 mmol/L — ABNORMAL LOW (ref 22–32)
Calcium: 10 mg/dL (ref 8.9–10.3)
Chloride: 103 mmol/L (ref 98–111)
Creatinine, Ser: 1.54 mg/dL — ABNORMAL HIGH (ref 0.61–1.24)
GFR calc Af Amer: 54 mL/min — ABNORMAL LOW (ref >60)
GFR calc non Af Amer: 46 mL/min — ABNORMAL LOW (ref >60)
Glucose, Bld: 126 mg/dL — ABNORMAL HIGH (ref 70–99)
Potassium: 4.7 mmol/L (ref 3.5–5.1)
Sodium: 137 mmol/L (ref 135–145)
Total Bilirubin: 0.9 mg/dL (ref 0.3–1.2)
Total Protein: 8.2 g/dL — ABNORMAL HIGH (ref 6.5–8.1)

## 2018-11-10 LAB — I-STAT CHEM 8, ED
BUN: 17 mg/dL (ref 8–23)
Calcium, Ion: 1.11 mmol/L — ABNORMAL LOW (ref 1.15–1.40)
Chloride: 102 mmol/L (ref 98–111)
Creatinine, Ser: 1.4 mg/dL — ABNORMAL HIGH (ref 0.61–1.24)
Glucose, Bld: 126 mg/dL — ABNORMAL HIGH (ref 70–99)
HCT: 52 % (ref 39.0–52.0)
Hemoglobin: 17.7 g/dL — ABNORMAL HIGH (ref 13.0–17.0)
Potassium: 4.5 mmol/L (ref 3.5–5.1)
Sodium: 137 mmol/L (ref 135–145)
TCO2: 22 mmol/L (ref 22–32)

## 2018-11-10 LAB — SODIUM: Sodium: 135 mmol/L (ref 135–145)

## 2018-11-10 LAB — APTT: aPTT: 31 seconds (ref 24–36)

## 2018-11-10 LAB — CBG MONITORING, ED: Glucose-Capillary: 123 mg/dL — ABNORMAL HIGH (ref 70–99)

## 2018-11-10 LAB — PROTIME-INR
INR: 1.2 (ref 0.8–1.2)
INR: 1 (ref 0.8–1.2)
Prothrombin Time: 15.3 seconds — ABNORMAL HIGH (ref 11.4–15.2)
Prothrombin Time: 13.1 seconds (ref 11.4–15.2)

## 2018-11-10 LAB — SARS CORONAVIRUS 2 BY RT PCR (HOSPITAL ORDER, PERFORMED IN ~~LOC~~ HOSPITAL LAB): SARS Coronavirus 2: NEGATIVE

## 2018-11-10 LAB — MRSA PCR SCREENING: MRSA by PCR: NEGATIVE

## 2018-11-10 MED ORDER — PROTHROMBIN COMPLEX CONC HUMAN 500 UNITS IV KIT
4313.0000 [IU] | PACK | Status: AC
  Administered 2018-11-10: 19:00:00 4313 [IU] via INTRAVENOUS
  Filled 2018-11-10 (×2): qty 4313

## 2018-11-10 MED ORDER — LABETALOL HCL 5 MG/ML IV SOLN: 20.0000 mg | Freq: Once | INTRAVENOUS | Status: DC

## 2018-11-10 MED ORDER — SPIRONOLACTONE 12.5 MG HALF TABLET
12.5000 mg | ORAL_TABLET | Freq: Every day | ORAL | Status: DC
  Administered 2018-11-11 – 2018-11-13 (×3): 12.5 mg via ORAL
  Filled 2018-11-10 (×3): qty 1

## 2018-11-10 MED ORDER — SACUBITRIL-VALSARTAN 24-26 MG PO TABS
1.0000 | ORAL_TABLET | Freq: Two times a day (BID) | ORAL | Status: DC
  Administered 2018-11-11 – 2018-11-13 (×5): 1 via ORAL
  Filled 2018-11-10 (×9): qty 1

## 2018-11-10 MED ORDER — CLEVIDIPINE BUTYRATE 0.5 MG/ML IV EMUL
0.0000 mg/h | INTRAVENOUS | Status: DC
  Administered 2018-11-10: 19:00:00 4 mg/h via INTRAVENOUS
  Administered 2018-11-11: 5 mg/h via INTRAVENOUS
  Filled 2018-11-10: qty 50
  Filled 2018-11-10: qty 100

## 2018-11-10 MED ORDER — ACETAMINOPHEN 325 MG PO TABS
650.0000 mg | ORAL_TABLET | ORAL | Status: DC | PRN
  Administered 2018-11-11 – 2018-11-13 (×5): 650 mg via ORAL
  Filled 2018-11-10 (×5): qty 2

## 2018-11-10 MED ORDER — PROTHROMBIN COMPLEX CONC HUMAN 500 UNITS IV KIT
50.0000 [IU]/kg | PACK | Status: DC
  Filled 2018-11-10: qty 4435

## 2018-11-10 MED ORDER — IOHEXOL 350 MG/ML SOLN
75.0000 mL | Freq: Once | INTRAVENOUS | Status: AC | PRN
  Administered 2018-11-10: 75 mL via INTRAVENOUS

## 2018-11-10 MED ORDER — DOFETILIDE 250 MCG PO CAPS
250.0000 ug | ORAL_CAPSULE | Freq: Two times a day (BID) | ORAL | Status: DC
  Administered 2018-11-11: 13:00:00 250 ug via ORAL
  Filled 2018-11-10 (×5): qty 1

## 2018-11-10 MED ORDER — CLEVIDIPINE BUTYRATE 0.5 MG/ML IV EMUL
0.0000 mg/h | INTRAVENOUS | Status: DC
  Administered 2018-11-10: 1 mg/h via INTRAVENOUS
  Filled 2018-11-10: qty 50

## 2018-11-10 MED ORDER — ACETAMINOPHEN 160 MG/5ML PO SOLN: 650.0000 mg | ORAL | Status: DC | PRN

## 2018-11-10 MED ORDER — CARVEDILOL 12.5 MG PO TABS
25.0000 mg | ORAL_TABLET | Freq: Two times a day (BID) | ORAL | Status: DC
  Administered 2018-11-11 – 2018-11-13 (×4): 25 mg via ORAL
  Filled 2018-11-10 (×4): qty 2

## 2018-11-10 MED ORDER — STROKE: EARLY STAGES OF RECOVERY BOOK
Freq: Once | Status: AC
  Administered 2018-11-11: 13:00:00
  Filled 2018-11-10: qty 1

## 2018-11-10 MED ORDER — FUROSEMIDE 40 MG PO TABS
40.0000 mg | ORAL_TABLET | Freq: Every day | ORAL | Status: DC
  Administered 2018-11-11 – 2018-11-13 (×3): 40 mg via ORAL
  Filled 2018-11-10 (×3): qty 1

## 2018-11-10 MED ORDER — DIAZEPAM 5 MG PO TABS
5.0000 mg | ORAL_TABLET | Freq: Two times a day (BID) | ORAL | Status: DC | PRN
  Administered 2018-11-11: 5 mg via ORAL
  Filled 2018-11-10: qty 1

## 2018-11-10 MED ORDER — GLECAPREVIR-PIBRENTASVIR 100-40 MG PO TABS: 3.0000 | ORAL_TABLET | Freq: Every day | ORAL | Status: DC

## 2018-11-10 MED ORDER — GABAPENTIN 300 MG PO CAPS
300.0000 mg | ORAL_CAPSULE | Freq: Three times a day (TID) | ORAL | Status: DC
  Administered 2018-11-11 – 2018-11-13 (×7): 300 mg via ORAL
  Filled 2018-11-10 (×7): qty 1

## 2018-11-10 MED ORDER — SODIUM CHLORIDE 3 % IV SOLN
INTRAVENOUS | Status: DC
  Administered 2018-11-10 – 2018-11-11 (×3): 50 mL/h via INTRAVENOUS
  Filled 2018-11-10 (×3): qty 500

## 2018-11-10 MED ORDER — POTASSIUM CHLORIDE CRYS ER 20 MEQ PO TBCR
20.0000 meq | EXTENDED_RELEASE_TABLET | Freq: Every day | ORAL | Status: DC
  Administered 2018-11-11 – 2018-11-13 (×3): 20 meq via ORAL
  Filled 2018-11-10 (×3): qty 1

## 2018-11-10 MED ORDER — SODIUM CHLORIDE 0.9% FLUSH: 3.0000 mL | Freq: Once | INTRAVENOUS | Status: DC

## 2018-11-10 MED ORDER — ACETAMINOPHEN 650 MG RE SUPP
650.0000 mg | RECTAL | Status: DC | PRN
  Administered 2018-11-11: 07:00:00 650 mg via RECTAL
  Filled 2018-11-10: qty 1

## 2018-11-10 MED ORDER — CHLORHEXIDINE GLUCONATE CLOTH 2 % EX PADS
6.0000 | MEDICATED_PAD | Freq: Every day | CUTANEOUS | Status: DC
  Administered 2018-11-10: 6 via TOPICAL

## 2018-11-10 MED ORDER — PANTOPRAZOLE SODIUM 40 MG IV SOLR
40.0000 mg | Freq: Every day | INTRAVENOUS | Status: DC
  Administered 2018-11-10: 40 mg via INTRAVENOUS
  Filled 2018-11-10: qty 40

## 2018-11-10 MED ORDER — METOPROLOL TARTRATE 5 MG/5ML IV SOLN
2.5000 mg | INTRAVENOUS | Status: DC | PRN
  Administered 2018-11-10: 2.5 mg via INTRAVENOUS
  Filled 2018-11-10: qty 5

## 2018-11-10 MED ORDER — SENNOSIDES-DOCUSATE SODIUM 8.6-50 MG PO TABS
1.0000 | ORAL_TABLET | Freq: Two times a day (BID) | ORAL | Status: DC
  Administered 2018-11-11 – 2018-11-12 (×3): 1 via ORAL
  Filled 2018-11-10 (×4): qty 1

## 2018-11-10 NOTE — Progress Notes (Signed)
Remote ICD transmission.   

## 2018-11-10 NOTE — ED Notes (Signed)
10 mg labetalol given in CT

## 2018-11-10 NOTE — ED Triage Notes (Signed)
Pt arrives via EMS from home with reports of LSN 8 pm last night with sudden left side weakness and slurred speech. Pt on eliquis and hx of CVA. A-fib on tele

## 2018-11-10 NOTE — ED Notes (Signed)
10 mg labetalol give in CT

## 2018-11-10 NOTE — H&P (Addendum)
Referring Physician: Dr. Stark Jock    Chief Complaint: Left sided weakness  HPI: Glenn [name] is an 68 y.o. male with atrial fibrillation on Eliquis, CHF, hepatitis C, HTN, recent cardiac defibrillator placement and prior stroke, who was in his USOH until 8 PM yesterday night, when his roommate noted him to have dropped a plate with his left hand. History obtained by EMS is sparse, but apparently 911 was not called until today, for left sided weakness. When EMS found the patient, he was not moving his left side, with left hemineglect and unawareness of his neurological deficit.   LSN: 8 PM Sunday night.  tPA Given: No: Out of time window   Past Medical History:  Diagnosis Date  . Atrial fibrillation ([place])   . CHF (congestive heart failure) ([name])   . Hepatitis C   . Hypertension   . Stroke ([place])   . Visit for monitoring Tikosyn therapy 03/26/2017    Past Surgical History:  Procedure Laterality Date  . CARDIAC DEFIBRILLATOR PLACEMENT  2015  . CARDIOVERSION N/A 10/10/2016   Procedure: CARDIOVERSION;  Surgeon: Dorothy Spark, MD;  Location: [place];  Service: Cardiovascular;  Laterality: N/A;  . CARDIOVERSION N/A 03/27/2017   Procedure: CARDIOVERSION;  Surgeon: Jerline Pain, MD;  Location: [facility] ENDOSCOPY;  Service: Cardiovascular;  Laterality: N/A;  . CARDIOVERSION N/A 10/29/2018   Procedure: CARDIOVERSION;  Surgeon: Sanda Klein, MD;  Location: [place] ENDOSCOPY;  Service: Cardiovascular;  Laterality: N/A;  . CARDIOVERSION N/A 11/05/2018   Procedure: CARDIOVERSION;  Surgeon: Acie Fredrickson Wonda Cheng, MD;  Location: [place];  Service: Cardiovascular;  Laterality: N/A;  . EYE SURGERY Left 1990  . IR PERCUTANEOUS ART THROMBECTOMY/INFUSION INTRACRANIAL INC DIAG ANGIO  09/05/2016  . IR RADIOLOGIST EVAL & MGMT  10/03/2016  . RADIOLOGY WITH ANESTHESIA N/A 09/05/2016   Procedure: RADIOLOGY WITH ANESTHESIA;  Surgeon: Luanne Bras, MD;  Location: [place];  Service: Radiology;  Laterality: N/A;   . RIGHT/LEFT HEART CATH AND CORONARY ANGIOGRAPHY N/A 11/03/2018   Procedure: RIGHT/LEFT HEART CATH AND CORONARY ANGIOGRAPHY;  Surgeon: Lorretta Harp, MD;  Location: [place] CV LAB;  Service: Cardiovascular;  Laterality: N/A;    Family History  Problem Relation Age of Onset  . High blood pressure Mother   . High blood pressure Father   . Stroke Maternal Aunt   . Heart disease Neg Hx    Social History:  reports that he has been smoking cigarettes. He has been smoking about 0.50 packs per day. He has never used smokeless tobacco. He reports previous alcohol use of about 3.0 standard drinks of alcohol per week. He reports current drug use. Frequency: 2.00 times per week. Drug: Marijuana.  Allergies:  Allergies  Allergen Reactions  . Benadryl [Diphenhydramine] Palpitations    Home Medications:    ROS: Denies headache or SOB. No cough. Has some wheezing. States he has some back pain. Otherwise unable to obtain a comprehensive ROS due to anosognosia.  Physical Examination: Blood pressure (!) 163/96, pulse (!) 126, temperature 99.2 F (37.3 C), temperature source Oral, resp. rate 16, SpO2 96 %.  HEENT: Jolly/AT Lungs: Respirations unlabored Ext: Warm and well perfused  Neurologic Examination: Mental Status: Drowsy. Fully oriented. Follows all commands. Prominent dysarthria; in this context speech is fluent. Able to name objects and answer questions.  Cranial Nerves: II:  Visual fields intact to monocular stimulation, with extinction on the left to DSS. PERRL. III,IV, VI: Rightward gaze preference. No nystagmus. Will gaze to the left with some  hesitation when tracking.   V,VII: Prominent left facial droop. Facial temp sensation decreased on the left.  VIII: hearing intact to voice IX,X:No hypophonia. Pharyngeal dysarthria noted.  XI: Head preferentially rotated to the right.  XII: Leftward tongue deviation Motor: RUE and RLE 5/5 LUE 0/5 with flaccid tone LLE 2/5  Sensory:  Decreased temp and FT sensation on the left. Positive for extinction to DSS LUE.   Deep Tendon Reflexes:  Right biceps, brachioradialis and patella 1+ Left biceps, brachioradialis and patella 2+ Plantars: Right: downgoing   Left: Equivocal Cerebellar: No ataxia with FNF on the right. Unable to perform on the left.  Gait: Deferred   Results for orders placed or performed during the hospital encounter of 11/10/18 (from the past 48 hour(s))  CBG monitoring, ED     Status: Abnormal   Collection Time: 11/10/18  5:53 PM  Result Value Ref Range   Glucose-Capillary 123 (H) 70 - 99 mg/dL   Comment 1 Notify RN    Comment 2 Document in Chart   I-stat chem 8, ED     Status: Abnormal   Collection Time: 11/10/18  6:01 PM  Result Value Ref Range   Sodium 137 135 - 145 mmol/L   Potassium 4.5 3.5 - 5.1 mmol/L   Chloride 102 98 - 111 mmol/L   BUN 17 8 - 23 mg/dL   Creatinine, Ser 1.40 (H) 0.61 - 1.24 mg/dL   Glucose, Bld 126 (H) 70 - 99 mg/dL   Calcium, Ion 1.11 (L) 1.15 - 1.40 mmol/L   TCO2 22 22 - 32 mmol/L   Hemoglobin 17.7 (H) 13.0 - 17.0 g/dL   HCT 52.0 39.0 - 52.0 %   No results found.  Assessment: 68 y.o. male presenting with acute onset of left sided weakness, left hemineglect and anosognosia 1. CT head reveals a large territory subacute hemorrhagic infarct in the right MCA territory. The area of hemorrhage is smaller than the bed of the ischemic stroke and measures approximately 2 x 3 cm; it is centered in the head of the caudate and putamen on the right. The blood likely is subacute. Local mass-effect and mild subfalcine herniation due to Edema are noted. ASPECTS is 3 2. Not a tPA candidate due to time criteria and presence of hemorrhage 3. Not an endovascular candidate due to low ASPECTS score.  4. Stroke Risk Factors - Atrial fibrillation, CHF, HTN and prior stroke  Plan: 1. HgbA1c, fasting lipid panel 2. May not be able to obtain MRI brain due to recent cardiac defibrillator  placement. Obtain repeat CT head in 24 hours (at 6 PM on Tuesday) to assess for stability of the hemorrhage.  3. PT consult, OT consult, Speech consult 4. TTE 5. CTA head and neck 6. STAT administration of K-Centra. He is out of the 18 hour Andexxa time window.  7. Hold Eliquis.  8. Telemetry monitoring 9. Frequent neuro checks 10. BP management. Hemorrhage treatment BP management parameters supersede permissive HTN treatment of stroke on a risk/benefit basis. SBP goal of 120-160 with clevidipine, which is 20 mm Hg higher than standard protocol with goal to maintain perfusion across stenotic partially thrombosed right MCA (see CTA result below). 11. Admitting to the ICU under the Neurology service.   12. Hypertonic saline initiated.    Addendum: CTA of head and neck reveals the following: 1.Subtotal occlusion right M1 segment with irregularity most consistent with thrombus. Good distal flow. Associated hemorrhagic infarct right MCA territory noted on earlier CT head. Moderate  atherosclerotic stenosis right cavernous carotid. 35-40% diameter stenosis right internal carotid artery stenosis at the origin. 2. Left carotid widely patent with mild atherosclerotic disease at the bifurcation. Both vertebral arteries widely patent. 3. Mild moderate basilar stenosis. Mild atherosclerotic disease posterior cerebral artery bilaterally.  80 minutes spent in the emergent neurological evaluation and management of this critically ill patient.   @Electronically  signed: Dr. Kerney Elbe 11/10/2018, 6:12 PM

## 2018-11-10 NOTE — Progress Notes (Signed)
Attempted [facility] Swallow Screen, patient coughing and choking. Will remain NPO.  Candy Sledge, RN

## 2018-11-10 NOTE — ED Provider Notes (Signed)
[facility] EMERGENCY DEPARTMENT Provider Note   CSN: [id number] Arrival date & time: 11/10/18  1751     History   Chief Complaint No chief complaint on file.   HPI SONG Glenn [name] is a 68 y.o. male.     Patient is a 68 year old male with past medical history of atrial fibrillation, CHF, COPD, and hypertension.  He is brought by EMS for evaluation of weakness and altered mental status.  According to the history the paramedics obtained from the patient's roommate, the patient yesterday was eating dinner at approximately 8 PM.  At that time, he dropped his fork, then began with slurred speech and weakness.  This did not improve throughout the the evening and today and patient transported here for evaluation.  The history is provided by the patient.    Past Medical History:  Diagnosis Date  . Atrial fibrillation ([place])   . CHF (congestive heart failure) ([place])   . Hepatitis C   . Hypertension   . Stroke ([place])   . Visit for monitoring Tikosyn therapy 03/26/2017    Patient Active Problem List   Diagnosis Date Noted  . Wide-complex tachycardia ([place])   . DCM (dilated cardiomyopathy) ([place])   . Atrial fibrillation ([place]) 11/04/2018  . Acute on chronic systolic heart failure ([place])   . Prolonged Q-T interval on ECG   . Elevated troponin   . CHF exacerbation ([place]) 10/30/2018  . Acute respiratory failure with hypoxia ([place])   . Acute kidney injury ([place])   . Entrapment of right ulnar nerve 02/28/2018  . Carpal tunnel syndrome of right wrist 02/28/2018  . Impotence due to erectile dysfunction 09/30/2017  . Solitary pulmonary nodule 06/10/2017  . Neck pain 04/06/2017  . Paroxysmal atrial fibrillation ([name])   . Visit for monitoring Tikosyn therapy 03/25/2017  . ICD (implantable cardioverter-defibrillator) in place 09/13/2016  . Chest pain 09/13/2016  . Tobacco abuse 09/13/2016  . Hospital discharge follow-up 09/13/2016  . Upper back pain 09/13/2016  . Housing  problems 09/13/2016  . Persistent atrial fibrillation   . Atrial fibrillation with RVR ([place])   . Ischemic cardiomyopathy   . Cerebral infarction ([place])   . Stroke (cerebrum) ([place]) 09/05/2016  . CHF (congestive heart failure) ([place]) 08/14/2016  . HTN (hypertension) 08/14/2016  . Chronic Hepatitis C  08/14/2016    Past Surgical History:  Procedure Laterality Date  . CARDIAC DEFIBRILLATOR PLACEMENT  2015  . CARDIOVERSION N/A 10/10/2016   Procedure: CARDIOVERSION;  Surgeon: Dorothy Spark, MD;  Location: [place];  Service: Cardiovascular;  Laterality: N/A;  . CARDIOVERSION N/A 03/27/2017   Procedure: CARDIOVERSION;  Surgeon: Jerline Pain, MD;  Location[facility] ENDOSCOPY;  Service: Cardiovascular;  Laterality: N/A;  . CARDIOVERSION N/A 10/29/2018   Procedure: CARDIOVERSION;  Surgeon: Sanda Klein, MD;  Location: [place] ENDOSCOPY;  Service: Cardiovascular;  Laterality: N/A;  . CARDIOVERSION N/A 11/05/2018   Procedure: CARDIOVERSION;  Surgeon: Acie Fredrickson Wonda Cheng, MD;  Location: [place];  Service: Cardiovascular;  Laterality: N/A;  . EYE SURGERY Left 1990  . IR PERCUTANEOUS ART THROMBECTOMY/INFUSION INTRACRANIAL INC DIAG ANGIO  09/05/2016  . IR RADIOLOGIST EVAL & MGMT  10/03/2016  . RADIOLOGY WITH ANESTHESIA N/A 09/05/2016   Procedure: RADIOLOGY WITH ANESTHESIA;  Surgeon: Luanne Bras, MD;  Location: [place];  Service: Radiology;  Laterality: N/A;  . RIGHT/LEFT HEART CATH AND CORONARY ANGIOGRAPHY N/A 11/03/2018   Procedure: RIGHT/LEFT HEART CATH AND CORONARY ANGIOGRAPHY;  Surgeon: Lorretta Harp, MD;  Location: [place] CV  LAB;  Service: Cardiovascular;  Laterality: N/A;        Home Medications    Prior to Admission medications   Medication Sig Start Date End Date Taking? Authorizing Provider  albuterol (VENTOLIN HFA) 108 (90 Base) MCG/ACT inhaler Inhale 2 puffs into the lungs every 4 (four) hours as needed for shortness of breath. 10/20/18   [provider]  apixaban  (ELIQUIS) 5 MG TABS tablet Take 1 tablet (5 mg total) by mouth 2 (two) times daily. 11/06/18   Mullis, Kiersten P, DO  carvedilol (COREG) 25 MG tablet Take 1 tablet (25 mg total) by mouth 2 (two) times daily with a meal. 11/06/18   Mullis, Kiersten P, DO  Cyanocobalamin (B-12) 500 MCG TABS Take 500 mcg by mouth daily as needed (energy). Patient taking differently: Take 500 mcg by mouth daily.  09/27/17   [name], MD  diazepam (VALIUM) 5 MG tablet TAKE 1/2 TABLET(2.5 MG) BY MOUTH EVERY 8 HOURS AS NEEDED FOR ANXIETY Patient taking differently: Take 5 mg by mouth 2 (two) times daily as needed for anxiety.  10/24/18   [name], MD  dofetilide (TIKOSYN) 250 MCG capsule Take 1 capsule (250 mcg total) by mouth 2 (two) times daily. 11/06/18   Mullis, Kiersten P, DO  furosemide (LASIX) 40 MG tablet Take 1 tablet (40 mg total) by mouth daily. 11/06/18   Mullis, Kiersten P, DO  gabapentin (NEURONTIN) 300 MG capsule TAKE 1 CAPSULE(300 MG) BY MOUTH THREE TIMES DAILY Patient taking differently: Take 300 mg by mouth 3 (three) times daily.  10/10/18   [name], MD  Glecaprevir-Pibrentasvir (MAVYRET) 100-40 MG TABS Take 3 tablets by mouth daily with supper. 11/06/18   Mullis, Kiersten P, DO  potassium chloride SA (K-DUR) 20 MEQ tablet Take 1 tablet (20 mEq total) by mouth daily. 11/06/18   Mullis, Kiersten P, DO  sacubitril-valsartan (ENTRESTO) 24-26 MG Take 1 tablet by mouth 2 (two) times daily. 11/06/18   Mullis, Kiersten P, DO  spironolactone (ALDACTONE) 25 MG tablet Take 0.5 tablets (12.5 mg total) by mouth daily. 11/07/18   Mullis, Kiersten P, DO  vitamin C (ASCORBIC ACID) 500 MG tablet Take 500 mg by mouth daily.    [provider]    Family History Family History  Problem Relation Age of Onset  . High blood pressure Mother   . High blood pressure Father   . Stroke Maternal Aunt   . Heart disease Neg Hx     Social History Social History   Tobacco Use  . Smoking status: Current  Some Day Smoker    Packs/day: 0.50    Types: Cigarettes  . Smokeless tobacco: Never Used  . Tobacco comment: a pack last three days  Substance Use Topics  . Alcohol use: Not Currently    Alcohol/week: 3.0 standard drinks    Types: 3 Cans of beer per week    Comment: pt stop drinking   . Drug use: Yes    Frequency: 2.0 times per week    Types: Marijuana    Comment: stop smoking      Allergies   Benadryl [diphenhydramine]   Review of Systems Review of Systems  Unable to perform ROS: Acuity of condition     Physical Exam Updated Vital Signs BP (!) 163/96 (BP Location: Right Arm)   Pulse (!) 126   Temp 99.2 F (37.3 C) (Oral)   Resp 16   SpO2 96%   Physical Exam Vitals signs and nursing note reviewed.  Constitutional:      General: He is not in acute distress.    Appearance: He is well-developed. He is not diaphoretic.  HENT:     Head: Normocephalic and atraumatic.  Neck:     Musculoskeletal: Normal range of motion and neck supple.  Cardiovascular:     Rate and Rhythm: Normal rate and regular rhythm.     Heart sounds: No murmur. No friction rub.  Pulmonary:     Effort: Pulmonary effort is normal. No respiratory distress.     Breath sounds: Normal breath sounds. No wheezing or rales.  Abdominal:     General: Bowel sounds are normal. There is no distension.     Palpations: Abdomen is soft.     Tenderness: There is no abdominal tenderness.  Musculoskeletal: Normal range of motion.  Skin:    General: Skin is warm and dry.  Neurological:     Mental Status: He is alert and oriented to person, place, and time.     Coordination: Coordination normal.     Comments: Patient is awake and alert.  He follows commands appropriately.  There is a left-sided facial droop and slurred speech.  He also has 3 out of 5 strength in the left arm and left leg with 5 out of 5 strength in the right arm and right leg.      ED Treatments / Results  Labs (all labs ordered are listed,  but only abnormal results are displayed) Labs Reviewed  DIFFERENTIAL - Abnormal; Notable for the following components:      Result Value   Neutro Abs 8.1 (*)    All other components within normal limits  I-STAT CHEM 8, ED - Abnormal; Notable for the following components:   Creatinine, Ser 1.40 (*)    Glucose, Bld 126 (*)    Calcium, Ion 1.11 (*)    Hemoglobin 17.7 (*)    All other components within normal limits  CBG MONITORING, ED - Abnormal; Notable for the following components:   Glucose-Capillary 123 (*)    All other components within normal limits  CBC  PROTIME-INR  APTT  COMPREHENSIVE METABOLIC PANEL    EKG None  Radiology No results found.  Procedures Procedures (including critical care time)  Medications Ordered in ED Medications  sodium chloride flush (NS) 0.9 % injection 3 mL (has no administration in time range)  prothrombin complex conc human (KCENTRA) IVPB 4,435 Units (has no administration in time range)     Initial Impression / Assessment and Plan / ED Course  I have reviewed the triage vital signs and the nursing notes.  Pertinent labs & imaging results that were available during my care of the patient were reviewed by me and considered in my medical decision making (see chart for details).  Patient brought by EMS as a code stroke for evaluation of strokelike symptoms as described in the HPI.  Patient was seen immediately upon arrival to the ER by myself and neurology.  Patient was then sent immediately for a head CT.  Unfortunately this revealed a large territory subacute hemorrhagic infarct to the right MCA.  Patient to be admitted to the neuro ICU under the care of Dr. Cheral Marker from neurology.  CRITICAL CARE Performed by: Veryl Speak Total critical care time: 35 minutes Critical care time was exclusive of separately billable procedures and treating other patients. Critical care was necessary to treat or prevent imminent or life-threatening  deterioration. Critical care was time spent personally by me on the following  activities: development of treatment plan with patient and/or surrogate as well as nursing, discussions with consultants, evaluation of patient's response to treatment, examination of patient, obtaining history from patient or surrogate, ordering and performing treatments and interventions, ordering and review of laboratory studies, ordering and review of radiographic studies, pulse oximetry and re-evaluation of patient's condition.   Final Clinical Impressions(s) / ED Diagnoses   Final diagnoses:  None    ED Discharge Orders    None       Veryl Speak, MD 11/10/18 [phone]

## 2018-11-10 NOTE — Progress Notes (Signed)
Patient to 4N26 at 2039, a/ox4 on arrival with [facility] as documented. Belongings with patient are: one pair sweatpants, one sweater, one pair socks, one shoe, one pack of cigarettes, one lighter, one debit card, and one $10 bill. Belongings verified by this RN and Luberta Mutter, RN  Candy Sledge, RN

## 2018-11-11 ENCOUNTER — Inpatient Hospital Stay (HOSPITAL_COMMUNITY): Payer: Medicare HMO

## 2018-11-11 DIAGNOSIS — R1312 Dysphagia, oropharyngeal phase: Secondary | ICD-10-CM

## 2018-11-11 DIAGNOSIS — I5021 Acute systolic (congestive) heart failure: Secondary | ICD-10-CM

## 2018-11-11 DIAGNOSIS — I4819 Other persistent atrial fibrillation: Secondary | ICD-10-CM

## 2018-11-11 DIAGNOSIS — I5042 Chronic combined systolic (congestive) and diastolic (congestive) heart failure: Secondary | ICD-10-CM

## 2018-11-11 DIAGNOSIS — I255 Ischemic cardiomyopathy: Secondary | ICD-10-CM

## 2018-11-11 DIAGNOSIS — Z8673 Personal history of transient ischemic attack (TIA), and cerebral infarction without residual deficits: Secondary | ICD-10-CM

## 2018-11-11 DIAGNOSIS — E785 Hyperlipidemia, unspecified: Secondary | ICD-10-CM

## 2018-11-11 LAB — CBC
HCT: 40.4 % (ref 39.0–52.0)
Hemoglobin: 13.7 g/dL (ref 13.0–17.0)
MCH: 32.4 pg (ref 26.0–34.0)
MCHC: 33.9 g/dL (ref 30.0–36.0)
MCV: 95.5 fL (ref 80.0–100.0)
Platelets: 145 10*3/uL — ABNORMAL LOW (ref 150–400)
RBC: 4.23 MIL/uL (ref 4.22–5.81)
RDW: 13.2 % (ref 11.5–15.5)
WBC: 8.6 10*3/uL (ref 4.0–10.5)
nRBC: 0 % (ref 0.0–0.2)

## 2018-11-11 LAB — BASIC METABOLIC PANEL
BUN: 12 mg/dL (ref 8–23)
CO2: 23 mmol/L (ref 22–32)
Calcium: 7.6 mg/dL — ABNORMAL LOW (ref 8.9–10.3)
Chloride: >130 mmol/L (ref 98–111)
Creatinine, Ser: 1.23 mg/dL (ref 0.61–1.24)
GFR calc Af Amer: >60 mL/min (ref >60)
GFR calc non Af Amer: >60 mL/min (ref >60)
Glucose, Bld: 110 mg/dL — ABNORMAL HIGH (ref 70–99)
Potassium: 3.8 mmol/L (ref 3.5–5.1)
Sodium: 174 mmol/L (ref 135–145)

## 2018-11-11 LAB — SODIUM
Sodium: 137 mmol/L (ref 135–145)
Sodium: 140 mmol/L (ref 135–145)

## 2018-11-11 LAB — LIPID PANEL
Cholesterol: 135 mg/dL (ref 0–200)
HDL: 24 mg/dL — ABNORMAL LOW (ref >40)
LDL Cholesterol: 96 mg/dL (ref 0–99)
Total CHOL/HDL Ratio: 5.6 RATIO
Triglycerides: 75 mg/dL (ref <150)
VLDL: 15 mg/dL (ref 0–40)

## 2018-11-11 LAB — HEMOGLOBIN A1C
Hgb A1c MFr Bld: 4.9 % (ref 4.8–5.6)
Mean Plasma Glucose: 93.93 mg/dL

## 2018-11-11 LAB — GLUCOSE, CAPILLARY: Glucose-Capillary: 98 mg/dL (ref 70–99)

## 2018-11-11 LAB — PROTIME-INR
INR: 1.2 (ref 0.8–1.2)
INR: 1.3 — ABNORMAL HIGH (ref 0.8–1.2)
Prothrombin Time: 15.1 seconds (ref 11.4–15.2)
Prothrombin Time: 15.6 seconds — ABNORMAL HIGH (ref 11.4–15.2)

## 2018-11-11 LAB — MAGNESIUM: Magnesium: 1.6 mg/dL — ABNORMAL LOW (ref 1.7–2.4)

## 2018-11-11 MED ORDER — CHLORHEXIDINE GLUCONATE 0.12 % MT SOLN
15.0000 mL | Freq: Two times a day (BID) | OROMUCOSAL | Status: DC
  Administered 2018-11-11 – 2018-11-13 (×5): 15 mL via OROMUCOSAL
  Filled 2018-11-11 (×5): qty 15

## 2018-11-11 MED ORDER — HEPARIN SODIUM (PORCINE) 5000 UNIT/ML IJ SOLN
5000.0000 [IU] | Freq: Three times a day (TID) | INTRAMUSCULAR | Status: DC
  Administered 2018-11-11 – 2018-11-13 (×6): 5000 [IU] via SUBCUTANEOUS
  Filled 2018-11-11 (×6): qty 1

## 2018-11-11 MED ORDER — MAGNESIUM SULFATE 4 GM/100ML IV SOLN
4.0000 g | Freq: Once | INTRAVENOUS | Status: AC
  Administered 2018-11-11: 15:00:00 4 g via INTRAVENOUS
  Filled 2018-11-11: qty 100

## 2018-11-11 MED ORDER — ATORVASTATIN CALCIUM 10 MG PO TABS
20.0000 mg | ORAL_TABLET | Freq: Every day | ORAL | Status: DC
  Administered 2018-11-12: 20 mg via ORAL
  Filled 2018-11-11: qty 2

## 2018-11-11 MED ORDER — LABETALOL HCL 5 MG/ML IV SOLN: 5.0000 mg | INTRAVENOUS | Status: DC | PRN

## 2018-11-11 MED ORDER — SODIUM CHLORIDE 0.9 % IV SOLN
INTRAVENOUS | Status: DC
  Administered 2018-11-11 – 2018-11-13 (×2): via INTRAVENOUS

## 2018-11-11 MED ORDER — ORAL CARE MOUTH RINSE
15.0000 mL | Freq: Two times a day (BID) | OROMUCOSAL | Status: DC
  Administered 2018-11-12 (×2): 15 mL via OROMUCOSAL

## 2018-11-11 NOTE — Progress Notes (Signed)
Cardiology Moonlighter Note  Called by care nurse regarding patient's dofetilide. On chart review, it looks like this was a home medication for him but he is non-adherent to therapy. He was given 1 dose earlier today around 1pm but I do not see a follow up ECG from after the dose was given. On review of the cardiology consultation note, I also do not see mention of whether he should restart/resume or hold dofetilide. It appears as though he is not currently anticoagulated due to acute hemorrhagic stroke. His most recent ECG reveals rate controlled atrial flutter.  I recommended against giving dose of dofetilide tonight. I have cancelled the standing dofetilide order. Will request that cardiology consult weigh in on whether to restart dofetilide tomorrow and whether patient needs formal dofetilide loading protocol with ECG's obtained after dosing or if he is safe to restart without this.   Marcie Mowers, MD Cardiology Fellow, PGY-7

## 2018-11-11 NOTE — Progress Notes (Signed)
RN spoke with pt and Mr. Velie has made it clear that he wants [name] to be point of contact and helping with decisions. A male by the name of Zina foster [phone] has called the unit several of times claiming she is the one in charge. PT stated Zina is the EX-wife, and that he does NOT want to return to [place] with her. RN asked pt alone who he wanted to make decisions, and he again stated he wants jimmy harris making decisions.

## 2018-11-11 NOTE — Evaluation (Addendum)
Occupational Therapy Evaluation Patient Details Name: Glenn [name] MRN: [id number] DOB: 1950-05-05 Today's Date: 11/11/2018    History of Present Illness 68 yo male presenting with left sided weakness and left neglect. CT showing large territory subacute hemorrhagic infarct right MCA territory, and ischemic stroke which measures approximately 2 x 3 cm centered in the head of the caudate and putamen on the right. PMH including atrial fibrillation on Eliquis, CHF, hepatitis C, HTN, recent cardiac defibrillator placement and prior stroke.   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 Pt currently requiring Min-Mod A for UB ADLs, Max A for LB ADLs, and Mod-Max A for functional mobility with RW. Pt presenting with decreased cognition, awareness of deficits, functional use of LUE, balance, strength, vision, and activity tolerance. Pt requiring increased assistance for mobility as he fatigued and presented with left lateral lean. Pt will require further acute OT to facilitate safe dc. Recommend dc to CIR for intensive OT to optimize safety, independence with ADLs, and return to PLOF.      Follow Up Recommendations  CIR;Supervision/Assistance - 24 hour    Equipment Recommendations  Other (comment)(Defer to next venue)    Recommendations for Other Services Rehab consult;PT consult;Speech consult     Precautions / Restrictions Precautions Precautions: Fall Precaution Comments: Poor awareness      Mobility Bed Mobility Overal bed mobility: Needs Assistance Bed Mobility: Supine to Sit     Supine to sit: Min assist     General bed mobility comments: reached for hand held assist to come up to sit and needed cues for righting trunk in sitting  Transfers Overall transfer level: Needs assistance Equipment used: Rolling walker (2 wheeled) Transfers: Sit to/from Stand Sit to Stand: Min  guard;Min assist;+2 safety/equipment         General transfer comment: Pt performing sit>stand with Min guard A for safety and assistance to place LUE onto RW; pt pulling wiht RUE on RW. Pt requiring Min A for safe descent to recliner    Balance Overall balance assessment: Needs assistance Sitting-balance support: No upper extremity supported;Feet supported Sitting balance-Leahy Scale: Fair     Standing balance support: Bilateral upper extremity supported;During functional activity Standing balance-Leahy Scale: Poor Standing balance comment: Reliant on physical A and UE support                           ADL either performed or assessed with clinical judgement   ADL Overall ADL's : Needs assistance/impaired Eating/Feeding: NPO   Grooming: Minimal assistance;Sitting   Upper Body Bathing: Minimal assistance;Sitting   Lower Body Bathing: Maximal assistance;+2 for safety/equipment;Sit to/from stand;+2 for physical assistance   Upper Body Dressing : Moderate assistance;Sitting   Lower Body Dressing: Maximal assistance;+2 for safety/equipment;+2 for physical assistance;Sit to/from stand   Toilet Transfer: Ambulation;RW;Minimal assistance;+2 for safety/equipment(Simulated to recliner) Toilet Transfer Details (indicate cue type and reason): Pt performing sit>stand with Min guard A for safety and assistance to place LUE onto RW. Pt requiring Min A for safe descent to recliner         Functional mobility during ADLs: Maximal assistance;+2 for physical assistance;+2 for safety/equipment;Rolling walker;Moderate assistance General ADL Comments: Pt presenting with poor balance, strength, cognition, attention of left side, vision, and activity tolerance.     Vision         Perception     Praxis  Pertinent Vitals/Pain Pain Assessment: No/denies pain     Hand Dominance Right   Extremity/Trunk Assessment Upper Extremity Assessment Upper Extremity Assessment: LUE  deficits/detail LUE Deficits / Details: Decreased strength and coorindation. Poor grasp as seen during MMT, ADLs, and mobility. Pt unable to perform finger opposition. Pt able to lift arm off the bed.  LUE Coordination: decreased fine motor;decreased gross motor   Lower Extremity Assessment Lower Extremity Assessment: Defer to PT evaluation   Cervical / Trunk Assessment Cervical / Trunk Assessment: Other exceptions Cervical / Trunk Exceptions: Poor trunk control with left lateral lean in standing   Communication Communication Communication: Expressive difficulties;Receptive difficulties   Cognition Arousal/Alertness: Awake/alert Behavior During Therapy: WFL for tasks assessed/performed Overall Cognitive Status: Impaired/Different from baseline Area of Impairment: Orientation;Attention;Memory;Following commands;Safety/judgement;Awareness;Problem solving                 Orientation Level: Disoriented to;Place;Time;Situation(Able to state it is 202 and September; but does not know day) Current Attention Level: Sustained Memory: Decreased recall of precautions;Decreased short-term memory Following Commands: Follows one step commands inconsistently;Follows one step commands with increased time Safety/Judgement: Decreased awareness of safety;Decreased awareness of deficits Awareness: Intellectual Problem Solving: Slow processing;Difficulty sequencing;Requires verbal cues;Requires tactile cues General Comments: Pt requiring increased cues and time throughout session. Pt with poor awareness of deficits. Pt with poor attention of left side (body and environment)   General Comments  BP supine 128/73 and at end of session 153/141 and 125/89    Exercises     Shoulder Instructions      Home Living Family/patient expects to be discharged to:: Private residence Living Arrangements: Non-relatives/Friends Available Help at Discharge: Friend(s);Available PRN/intermittently;Available 24  hours/day Type of Home: House Home Access: Stairs to enter [employer] of Steps: 5   Home Layout: One level     Bathroom Shower/Tub: Teacher, early years/pre: Standard     Home Equipment: None          Prior Functioning/Environment Level of Independence: Independent        Comments: Worked as a Therapist, music Problem List: Decreased strength;Decreased range of motion;Decreased activity tolerance;Impaired balance (sitting and/or standing);Decreased cognition;Decreased safety awareness;Decreased coordination;Impaired vision/perception;Decreased knowledge of use of DME or AE;Decreased knowledge of precautions;Impaired UE functional use;Pain      OT Treatment/Interventions: Self-care/ADL training;Therapeutic exercise;Energy conservation;DME and/or AE instruction;Therapeutic activities;Patient/family education    OT Goals(Current goals can be found in the care plan section) Acute Rehab OT Goals Patient Stated Goal: "Go home and see my puppy" OT Goal Formulation: With patient Time For Goal Achievement: 11/25/18 Potential to Achieve Goals: Good  OT Frequency: Min 3X/week   Barriers to D/C:            Co-evaluation PT/OT/SLP Co-Evaluation/Treatment: Yes Reason for Co-Treatment: For patient/therapist safety;To address functional/ADL transfers PT goals addressed during session: Mobility/safety with mobility;Balance;Proper use of DME OT goals addressed during session: ADL's and self-care      AM-PAC OT "6 Clicks" Daily Activity     Outcome Measure Help from another person eating meals?: Total Help from another person taking care of personal grooming?: A Little Help from another person toileting, which includes using toliet, bedpan, or urinal?: A Lot Help from another person bathing (including washing, rinsing, drying)?: A Lot Help from another person to put on and taking off regular upper body clothing?: A Little Help from another person to put  on and taking off regular lower body clothing?: A Lot 6 Click  Score: 13   End of Session Equipment Utilized During Treatment: Gait belt;Rolling walker Nurse Communication: Mobility status  Activity Tolerance: Patient tolerated treatment well Patient left: in chair;with call bell/phone within reach;with chair alarm set  OT Visit Diagnosis: Unsteadiness on feet (R26.81);Other abnormalities of gait and mobility (R26.89);Muscle weakness (generalized) (M62.81);Other symptoms and signs involving cognitive function;Hemiplegia and hemiparesis Hemiplegia - Right/Left: Left Hemiplegia - dominant/non-dominant: Non-Dominant Hemiplegia - caused by: Cerebral infarction                Time: [id number] OT Time Calculation (min): 28 min Charges:  OT General Charges $OT Visit: 1 Visit OT Evaluation $OT Eval Moderate Complexity: 1 Mod  Kaitlen Redford MSOT, OTR/L Acute Rehab Pager: [phone] Office: [place] 11/11/2018, 1:08 PM

## 2018-11-11 NOTE — Consult Note (Addendum)
,  Cardiology Consultation:   Patient ID: Glenn [name] MRN: [id number]; DOB: 1951-01-05  Admit date: 11/10/2018 Date of Consult: 11/11/2018  Primary Care Provider: [name], MD Primary Cardiologist: Virl Axe, MD  Primary Electrophysiologist:  None    Patient Profile:   Glenn [name] is a 68 y.o. male with a hx of Afib on Eliquis s/p ICD, Chronic systolic heart failure (EF 15-20% 2020), NICM (per recent cath 10/2018), noncompliance, hep C, hypertension, h/o stroke, tobacco abuse, remote h/o cocaine use who is being seen today for the evaluation of afib in the setting of acute stroke at the request of Dr. Erlinda Hong.  History of Present Illness:   [name] moved from [place] in May 2018. Patient had a stroked in 2018 to left MCA CVA s/p thrombectomy for left M2 occlusion. He had apparently missed some doses of Xarelto. He was switched from Eliquis and Aspirin. 2-D echo at that time showed LVEF 30-35% with diffuse hypokinesis. After being discharged he started seeing Dr. [name] regularly. He reported a 2 year h/o afib and had an ICD- [employer] implanted 12/2015. He reported a heart cath in [place] showing nonobstructive CAD. Device interrogation in August 2018 showed rapid atrial rates and low dose diltiazem was added being that afib was thought to be contributing to heart failure. In December 2018 the patient stopped his lisinopril because it was making him dizzy and BP in the office was low. He saw the afib clinic 03/2017 and was admitted for Tikosyn initiation and had successful cardioversion. At f/u appointment 07/2017 patient was noted to be back in afib. Patient was scheduled for another cardioversion but it was not performed because he was in sinus bradycardia the morning of. F/u afib appointment 09/2018 patient was back in afib. He reported noncompliance with his Tikosyn at that time. He was seen 10/23/18 back in the office and was having DOE and his diuretic was increased for 3  days. He went through cardioversion 10/29/18 and it was successful. The patient was same-day discharge but ended up returning to the ED later that day for worsening shortness of breath. The patient was admitted for diuresis and was noted to be back in afib. An echo was performed revealing 15-20%. Patient had a cath which showed nonobstructive CAD. Patient went through successful cardioversion 11/05/18. The patient was discharged the next with scheduled visit at the Afib clinic 9/21, but it appears the patient did not show up.   Patient was brought to the ED 11/10/18 for acute stroke. Reportedly the patient was in his usual state of health until 8 PM the night before. The patient's roommate noted that the patient was unable to hold a plate in his left hand and dropped it. Apparently EMS was not called until the next day. At that point the patient had left-sided weakness and visual changes. Neurology was consulted. CT showed a large territory subacute hemorrhagic infarct in the right MCA territory. He was not given tPA due to time criteria. EKG showed aflutter, 120 bpm.   Labs showed sodium 174, potassium 3.8, glucose 110, magnesium 1.6. LDL 96. WBC 8.6, Hgb 13.7. A1C 4.9.  Patient denies missing any doses of his medications. Patient was unable to respond to some questions on exam. He seemed to be coherent but was slow to respond and had some slurred speech.   Heart Pathway Score:     Past Medical History:  Diagnosis Date   Atrial fibrillation ([name])    CHF (congestive heart failure) (  [place])    Hepatitis C    Hypertension    Stroke [facility])    Visit for monitoring Tikosyn therapy 03/26/2017    Past Surgical History:  Procedure Laterality Date   CARDIAC DEFIBRILLATOR PLACEMENT  2015   CARDIOVERSION N/A 10/10/2016   Procedure: CARDIOVERSION;  Surgeon: Dorothy Spark, MD;  Location: [place];  Service: Cardiovascular;  Laterality: N/A;   CARDIOVERSION N/A 03/27/2017   Procedure:  CARDIOVERSION;  Surgeon: Jerline Pain, MD;  Location: [facility] ENDOSCOPY;  Service: Cardiovascular;  Laterality: N/A;   CARDIOVERSION N/A 10/29/2018   Procedure: CARDIOVERSION;  Surgeon: Sanda Klein, MD;  Location: [place];  Service: Cardiovascular;  Laterality: N/A;   CARDIOVERSION N/A 11/05/2018   Procedure: CARDIOVERSION;  Surgeon: Rachid Parham, Wonda Cheng, MD;  Location: [place];  Service: Cardiovascular;  Laterality: N/A;   EYE SURGERY Left 1990   IR PERCUTANEOUS ART THROMBECTOMY/INFUSION INTRACRANIAL INC DIAG ANGIO  09/05/2016   IR RADIOLOGIST EVAL & MGMT  10/03/2016   RADIOLOGY WITH ANESTHESIA N/A 09/05/2016   Procedure: RADIOLOGY WITH ANESTHESIA;  Surgeon: Luanne Bras, MD;  Location: [place];  Service: Radiology;  Laterality: N/A;   RIGHT/LEFT HEART CATH AND CORONARY ANGIOGRAPHY N/A 11/03/2018   Procedure: RIGHT/LEFT HEART CATH AND CORONARY ANGIOGRAPHY;  Surgeon: Lorretta Harp, MD;  Location: [place] CV LAB;  Service: Cardiovascular;  Laterality: N/A;     Home Medications:  Prior to Admission medications   Medication Sig Start Date End Date Taking? Authorizing Provider  albuterol (VENTOLIN HFA) 108 (90 Base) MCG/ACT inhaler Inhale 2 puffs into the lungs every 4 (four) hours as needed for shortness of breath. 10/20/18  Yes [provider]  apixaban (ELIQUIS) 5 MG TABS tablet Take 1 tablet (5 mg total) by mouth 2 (two) times daily. 11/06/18  Yes Mullis, Kiersten P, DO  aspirin EC 325 MG tablet Take 162.5 mg by mouth daily.   Yes [provider]  carvedilol (COREG) 25 MG tablet Take 1 tablet (25 mg total) by mouth 2 (two) times daily with a meal. 11/06/18  Yes Mullis, Kiersten P, DO  Cyanocobalamin (B-12) 500 MCG TABS Take 500 mcg by mouth daily as needed (energy). Patient taking differently: Take 500 mcg by mouth daily.  09/27/17  Yes [name], MD  diazepam (VALIUM) 5 MG tablet TAKE 1/2 TABLET(2.5 MG) BY MOUTH EVERY 8 HOURS AS NEEDED FOR  ANXIETY Patient taking differently: Take 5 mg by mouth 2 (two) times daily as needed for anxiety.  10/24/18  Yes [name], MD  dofetilide (TIKOSYN) 250 MCG capsule Take 1 capsule (250 mcg total) by mouth 2 (two) times daily. 11/06/18  Yes Mullis, Kiersten P, DO  furosemide (LASIX) 40 MG tablet Take 1 tablet (40 mg total) by mouth daily. 11/06/18  Yes Mullis, Kiersten P, DO  gabapentin (NEURONTIN) 300 MG capsule TAKE 1 CAPSULE(300 MG) BY MOUTH THREE TIMES DAILY Patient taking differently: Take 300 mg by mouth 3 (three) times daily.  10/10/18  Yes [name], MD  potassium chloride SA (K-DUR) 20 MEQ tablet Take 1 tablet (20 mEq total) by mouth daily. 11/06/18  Yes Mullis, Kiersten P, DO  sacubitril-valsartan (ENTRESTO) 24-26 MG Take 1 tablet by mouth 2 (two) times daily. 11/06/18  Yes Mullis, Kiersten P, DO  spironolactone (ALDACTONE) 25 MG tablet Take 0.5 tablets (12.5 mg total) by mouth daily. 11/07/18  Yes Mullis, Kiersten P, DO  vitamin C (ASCORBIC ACID) 500 MG tablet Take 500 mg by mouth daily.   Yes [provider]  Glecaprevir-Pibrentasvir (MAVYRET) 100-40 MG TABS Take 3 tablets by mouth daily with supper. Patient not taking: Reported on 11/10/2018 11/06/18   Danna Hefty, DO    Inpatient Medications: Scheduled Meds:  carvedilol  25 mg Oral BID WC   chlorhexidine  15 mL Mouth Rinse BID   Chlorhexidine Gluconate Cloth  6 each Topical Daily   dofetilide  250 mcg Oral BID   furosemide  40 mg Oral Daily   gabapentin  300 mg Oral TID   heparin injection (subcutaneous)  5,000 Units Subcutaneous Q8H   labetalol  20 mg Intravenous Once   mouth rinse  15 mL Mouth Rinse q12n4p   pantoprazole (PROTONIX) IV  40 mg Intravenous QHS   potassium chloride SA  20 mEq Oral Daily   sacubitril-valsartan  1 tablet Oral BID   senna-docusate  1 tablet Oral BID   sodium chloride flush  3 mL Intravenous Once   spironolactone  12.5 mg Oral Daily   Continuous Infusions:   sodium chloride     clevidipine Stopped (11/11/18 0142)   PRN Meds: acetaminophen **OR** acetaminophen (TYLENOL) oral liquid 160 mg/5 mL **OR** acetaminophen, diazepam, metoprolol tartrate  Allergies:    Allergies  Allergen Reactions   Benadryl [Diphenhydramine] Palpitations    Social History:   Social History   Socioeconomic History   Marital status: Single    Spouse name: Not on file   Number of children: Not on file   Years of education: 69 (some college)   Highest education level: Not on file  Occupational History   Occupation: disability  Social Designer, fashion/clothing strain: Not on file   Food insecurity    Worry: Not on file    Inability: Not on file   Transportation needs    Medical: Not on file    Non-medical: Not on file  Tobacco Use   Smoking status: Current Some Day Smoker    Packs/day: 0.50    Types: Cigarettes   Smokeless tobacco: Never Used   Tobacco comment: a pack last three days  Substance and Sexual Activity   Alcohol use: Not Currently    Alcohol/week: 3.0 standard drinks    Types: 3 Cans of beer per week    Comment: pt stop drinking    Drug use: Yes    Frequency: 2.0 times per week    Types: Marijuana    Comment: stop smoking    Sexual activity: Yes    Partners: Female    Birth control/protection: Condom  Lifestyle   Physical activity    Days per week: Not on file    Minutes per session: Not on file   Stress: Not on file  Relationships   Social connections    Talks on phone: Not on file    Gets together: Not on file    Attends religious service: Not on file    Active member of club or organization: Not on file    Attends meetings of clubs or organizations: Not on file    Relationship status: Not on file   Intimate partner violence    Fear of current or ex partner: Not on file    Emotionally abused: Not on file    Physically abused: Not on file    Forced sexual activity: Not on file  Other Topics Concern    Not on file  Social History Narrative   Not on file    Family History:   Family History  Problem Relation Age of  Onset   High blood pressure Mother    High blood pressure Father    Stroke Maternal Aunt    Heart disease Neg Hx      ROS:  Please see the history of present illness.  All other ROS reviewed and negative.     Physical Exam/Data:   Vitals:   11/11/18 1000 11/11/18 1030 11/11/18 1100 11/11/18 1200  BP: 117/61 (!) 136/109  124/65  Pulse: 86 (!) 55 (!) 53 88  Resp: 14 (!) 23 (!) 31 20  Temp:      TempSrc:      SpO2: 97% 98% 97% 98%    Intake/Output Summary (Last 24 hours) at 11/11/2018 1319 Last data filed at 11/11/2018 1200 Gross per 24 hour  Intake 893.01 ml  Output 350 ml  Net 543.01 ml   Last 3 Weights 11/06/2018 11/05/2018 11/04/2018  Weight (lbs) 195 lb 8 oz 197 lb 201 lb 12.8 oz  Weight (kg) 88.678 kg 89.359 kg 91.536 kg     There is no height or weight on file to calculate BMI.  General:  Well nourished, well developed, in no acute distress HEENT: normal Lymph: no adenopathy Neck: no JVD Endocrine:  No thryomegaly Vascular: No carotid bruits; FA pulses 2+ bilaterally without bruits  Cardiac:  normal S1, S2; RRR; no murmur  Lungs:  clear to auscultation bilaterally, no wheezing, rhonchi or rales  Abd: soft, nontender, no hepatomegaly  Ext: no edema Musculoskeletal:  No deformities, BUE and BLE strength normal and equal Skin: warm and dry  Neuro:  Left-sided weakness upper and lower extremity; coherent but with slurred speech; face with left sided droop  Psych:  Normal affect   EKG:  The EKG was personally reviewed and demonstrates: Aflutter, 120 bpm, LVH, 1 PVC Telemetry:  Telemetry was personally reviewed and demonstrates:  Afib with rates 90-110 with elevation to 120. Occasional PVCs  Relevant CV Studies:  Pacemaker check 9/19 Normal device function. 2 NSVT episodes, both 12 seconds  Cardiac Cath 11/03/18 Mr. Schranz has clean  coronary arteries, elevated filling pressures consistent with a nonischemic cardiomyopathy with acute on chronic systolic heart failure.  He is in A. fib with RVR.  He will need pharmacologic optimization and initiation of diuretic therapy for LVEDP and elevated filling pressures.  He already has an ICD in place.  The sheath was removed and a TR band was placed on the right wrist to achieve patent hemostasis.  The Swan-Ganz catheter was removed as well as the antecubital sheath.  The patient left lab in stable condition.  Dr. Fransico Him was notified of these results.  Echo 10/30/18   1. The left ventricle has severely reduced systolic function, with an ejection fraction of 15-20%. The cavity size was moderately dilated. There is moderate concentric left ventricular hypertrophy. Left ventricular diastolic function could not be  evaluated secondary to atrial fibrillation. Left ventricular diffuse hypokinesis.  2. The right ventricle has moderately reduced systolic function. The cavity was moderately enlarged. There is no increase in right ventricular wall thickness. Right ventricular systolic pressure is moderately elevated with an estimated pressure of 45.5  mmHg.  3. Left atrial size was moderately dilated.  4. Right atrial size was severely dilated.  5. Tricuspid valve regurgitation is severe.  6. Mild thickening of the aortic valve. Aortic valve regurgitation is moderate by color flow Doppler.  7. The aorta is normal unless otherwise noted.  Laboratory Data:  High Sensitivity Troponin:   Recent Labs  Lab  10/29/18 2355 10/30/18 0127 10/30/18 0339 10/30/18 1649 10/30/18 1826  TROPONINIHS 55* 58* 52* 31* 32*     Chemistry Recent Labs  Lab 11/06/18 0529 11/10/18 1753 11/10/18 1801  11/11/18 0104 11/11/18 0329 11/11/18 0546  NA 137 137 137   < > 137 174* 140  K 3.9 4.7 4.5  --   --  3.8  --   CL 99 103 102  --   --  >130*  --   CO2 28 21*  --   --   --  23  --   GLUCOSE 124* 126*  126*  --   --  110*  --   BUN 15 15 17   --   --  12  --   CREATININE 1.48* 1.54* 1.40*  --   --  1.23  --   CALCIUM 8.9 10.0  --   --   --  7.6*  --   GFRNONAA 49* 46*  --   --   --  >60  --   GFRAA 56* 54*  --   --   --  >60  --   ANIONGAP 10 13  --   --   --  NOT CALCULATED  --    < > = values in this interval not displayed.    Recent Labs  Lab 11/10/18 1753  PROT 8.2*  ALBUMIN 4.4  AST 26  ALT 21  ALKPHOS 94  BILITOT 0.9   Hematology Recent Labs  Lab 11/06/18 0529 11/10/18 1753 11/10/18 1801 11/11/18 0329  WBC 5.0 10.5  --  8.6  RBC 3.67* 5.05  --  4.23  HGB 11.8* 16.4 17.7* 13.7  HCT 36.4* 47.6 52.0 40.4  MCV 99.2 94.3  --  95.5  MCH 32.2 32.5  --  32.4  MCHC 32.4 34.5  --  33.9  RDW 13.5 12.9  --  13.2  PLT 176 201  --  145*   BNPNo results for input(s): BNP, PROBNP in the last 168 hours.  DDimer No results for input(s): DDIMER in the last 168 hours.   Radiology/Studies:  Ct Angio Head W Or Wo Contrast  Result Date: 11/10/2018 CLINICAL DATA:  Stroke with left-sided weakness and slurred speech EXAM: CT ANGIOGRAPHY HEAD AND NECK TECHNIQUE: Multidetector CT imaging of the head and neck was performed using the standard protocol during bolus administration of intravenous contrast. Multiplanar CT image reconstructions and MIPs were obtained to evaluate the vascular anatomy. Carotid stenosis measurements (when applicable) are obtained utilizing NASCET criteria, using the distal internal carotid diameter as the denominator. CONTRAST:  53mL OMNIPAQUE IOHEXOL 350 MG/ML SOLN COMPARISON:  CT head 11/10/2018 FINDINGS: CTA NECK FINDINGS Aortic arch: Standard branching. Imaged portion shows no evidence of aneurysm or dissection. No significant stenosis of the major arch vessel origins. Right carotid system: Calcified and noncalcified plaque right carotid bulb narrowing the lumen by approximately 35-40% diameter stenosis. Left carotid system: Mild atherosclerotic disease left carotid  bifurcation without significant stenosis. Vertebral arteries: Both vertebral arteries patent to the basilar without significant stenosis. Skeleton: Cervical spondylosis without acute abnormality. Other neck: Negative Upper chest: Negative Review of the MIP images confirms the above findings CTA HEAD FINDINGS Anterior circulation: Heavily calcified right cavernous carotid with moderate stenosis. Irregular subtotal occlusion of the right M1 segment with good distal flow. The area of stenosis has irregularity most compatible with thrombus. Left middle cerebral artery widely patent. Anterior cerebral arteries patent bilaterally without significant stenosis. Posterior circulation: Both vertebral arteries  patent to the basilar. PICA patent bilaterally. Atherosclerotic irregularity and mild to moderate stenosis in the mid and distal basilar artery. Superior cerebellar and posterior cerebral arteries patent bilaterally. Mild atherosclerotic disease in the posterior cerebral arteries bilaterally. Venous sinuses: Patent Anatomic variants: None Review of the MIP images confirms the above findings IMPRESSION: 1. Subtotal occlusion right M1 segment with irregularity most consistent with thrombus. Good distal flow. Associated hemorrhagic infarct right MCA territory noted on earlier CT head. Moderate atherosclerotic stenosis right cavernous carotid. 35-40% diameter stenosis right internal carotid artery stenosis at the origin. 2. Left carotid widely patent with mild atherosclerotic disease at the bifurcation. Both vertebral arteries widely patent. 3. Mild moderate basilar stenosis. Mild atherosclerotic disease posterior cerebral artery bilaterally. 4. These results were called by telephone at the time of interpretation on 11/10/2018 at 6:45 pm to provider ERIC [facility] , who verbally acknowledged these results. 5. Electronically Signed   By: Franchot Gallo M.D.   On: 11/10/2018 18:56   Ct Head Wo Contrast  Result Date:  11/11/2018 CLINICAL DATA:  Intracranial hemorrhage, known, follow-up. Additional history provided: Patient reports frontal headache today. EXAM: CT HEAD WITHOUT CONTRAST TECHNIQUE: Contiguous axial images were obtained from the base of the skull through the vertex without intravenous contrast. COMPARISON:  CT angiogram head/neck 11/10/2018, noncontrast head CT 11/10/2018 FINDINGS: Brain: Again demonstrated is a large region of cortical/subcortical edema within the right frontal lobe with additional involvement of the right insula and extending to the right basal ganglia. Hemorrhage centered within the right caudate head and extending inferiorly into the right basal ganglia has not significantly changed in size or appearance, measuring 2.0 x 3.4 cm (TV x CC) on the current examination (previously 2 x 3.3 cm). Associated mass effect with mild right to left subfalcine herniation, partial effacement of the right lateral ventricle frontal horn and 2 mm leftward midline shift at the level of the septum pellucidum, similar to prior examination. Also similar to prior examination there is small volume intraventricular hemorrhage within the atrium of the right lateral ventricle. Vascular: Persistent hyperdensity within the M1 right middle cerebral artery. Subtotal occlusion of the M1 right MCA was noted on CT angiogram head 11/10/2018. Atherosclerotic calcification of the carotid artery siphons. Skull: No calvarial fracture. Sinuses/Orbits: Mild scattered paranasal sinus mucosal thickening. No significant mastoid effusion Visualized orbits demonstrate no acute abnormality. IMPRESSION: 1. No significant interval change in large subacute right MCA/ACA vascular territory hemorrhagic infarction as compared to head CT 11/10/2018. An area of hemorrhage centered within the right caudate head and extending inferiorly within the right basal ganglia measures 2 x 3.4 cm (previously 2 x 3.3). Small volume hemorrhage extending within the  atrium of the right lateral ventricle has not significantly changed. 2. Mass effect with partial effacement of the right lateral ventricle frontal horn, right to left subfalcine herniation and 2 mm leftward midline shift, also similar to prior exam. Electronically Signed   By: Kellie Simmering   On: 11/11/2018 09:38   Ct Angio Neck W Or Wo Contrast  Result Date: 11/10/2018 CLINICAL DATA:  Stroke with left-sided weakness and slurred speech EXAM: CT ANGIOGRAPHY HEAD AND NECK TECHNIQUE: Multidetector CT imaging of the head and neck was performed using the standard protocol during bolus administration of intravenous contrast. Multiplanar CT image reconstructions and MIPs were obtained to evaluate the vascular anatomy. Carotid stenosis measurements (when applicable) are obtained utilizing NASCET criteria, using the distal internal carotid diameter as the denominator. CONTRAST:  7mL OMNIPAQUE IOHEXOL 350 MG/ML  SOLN COMPARISON:  CT head 11/10/2018 FINDINGS: CTA NECK FINDINGS Aortic arch: Standard branching. Imaged portion shows no evidence of aneurysm or dissection. No significant stenosis of the major arch vessel origins. Right carotid system: Calcified and noncalcified plaque right carotid bulb narrowing the lumen by approximately 35-40% diameter stenosis. Left carotid system: Mild atherosclerotic disease left carotid bifurcation without significant stenosis. Vertebral arteries: Both vertebral arteries patent to the basilar without significant stenosis. Skeleton: Cervical spondylosis without acute abnormality. Other neck: Negative Upper chest: Negative Review of the MIP images confirms the above findings CTA HEAD FINDINGS Anterior circulation: Heavily calcified right cavernous carotid with moderate stenosis. Irregular subtotal occlusion of the right M1 segment with good distal flow. The area of stenosis has irregularity most compatible with thrombus. Left middle cerebral artery widely patent. Anterior cerebral arteries  patent bilaterally without significant stenosis. Posterior circulation: Both vertebral arteries patent to the basilar. PICA patent bilaterally. Atherosclerotic irregularity and mild to moderate stenosis in the mid and distal basilar artery. Superior cerebellar and posterior cerebral arteries patent bilaterally. Mild atherosclerotic disease in the posterior cerebral arteries bilaterally. Venous sinuses: Patent Anatomic variants: None Review of the MIP images confirms the above findings IMPRESSION: 1. Subtotal occlusion right M1 segment with irregularity most consistent with thrombus. Good distal flow. Associated hemorrhagic infarct right MCA territory noted on earlier CT head. Moderate atherosclerotic stenosis right cavernous carotid. 35-40% diameter stenosis right internal carotid artery stenosis at the origin. 2. Left carotid widely patent with mild atherosclerotic disease at the bifurcation. Both vertebral arteries widely patent. 3. Mild moderate basilar stenosis. Mild atherosclerotic disease posterior cerebral artery bilaterally. 4. These results were called by telephone at the time of interpretation on 11/10/2018 at 6:45 pm to provider ERIC [facility] , who verbally acknowledged these results. 5. Electronically Signed   By: Franchot Gallo M.D.   On: 11/10/2018 18:56   Dg Chest Portable 1 View  Result Date: 11/10/2018 CLINICAL DATA:  Stroke EXAM: PORTABLE CHEST 1 VIEW COMPARISON:  10/29/2018 FINDINGS: Left AICD remains in place, unchanged. Cardiomegaly. No confluent opacities, effusions or edema. No acute bony abnormality. IMPRESSION: Cardiomegaly.  No active disease. Electronically Signed   By: Rolm Baptise M.D.   On: 11/10/2018 19:58   Ct Head Code Stroke Wo Contrast  Result Date: 11/10/2018 CLINICAL DATA:  Code stroke.  Left-sided weakness slurred speech EXAM: CT HEAD WITHOUT CONTRAST TECHNIQUE: Contiguous axial images were obtained from the base of the skull through the vertex without intravenous  contrast. COMPARISON:  CT head 09/07/2016 FINDINGS: Brain: Large area of cortical edema in the right frontal lobe. There is also involvement of the insula and basal ganglia which show abnormal hypodensity. Hyperdensity in the right head of caudate and anterior basal ganglia compatible with recent hemorrhage. Mild subfalcine herniation of right frontal lobe due to edema. Ventricle size normal.  No other acute infarct or mass Vascular: Negative for hyperdense vessel Skull: Negative Sinuses/Orbits: Mild mucosal edema paranasal sinuses. Negative orbit. Other: None ASPECTS (Collins Stroke Program Early CT Score) - Ganglionic level infarction (caudate, lentiform nuclei, internal capsule, insula, M1-M3 cortex): 1 - Supraganglionic infarction (M4-M6 cortex): 2 Total score (0-10 with 10 being normal): 3 IMPRESSION: 1. Large territory subacute hemorrhagic infarct right MCA territory. Area of hemorrhage measures approximately 2 x 3 cm centered in the head of the caudate and putamen on the right. The blood likely is subacute. Local mass-effect and mild subfalcine herniation due to edema. 2. ASPECTS is 3 3. Emergent results communicated with Dr. Cheral Marker via text  page. Electronically Signed   By: Franchot Gallo M.D.   On: 11/10/2018 18:13    Assessment and Plan:   Acute stroke  Patient has h/o of stroke in 2018 due to missed doses of Xarelto. Patient brought by EMS 11/10/18 for left sided weakness and left hemineglect, found to have acute stroke.  - CT showed large territory of subacute hemorrhagic infarct in the right MCA territory. Area of hemorrhage is smaller than the bed of the ischemic stroke and measures approximately 2 x 3 cm. Thinks the blood is subacute - Not given tPA because he was outside time window - MRI head not performed due to ICD - Eliquis on hold - plan for CTA head and neck.  - PT/OT/Speech consulted - TTE per neurology - Patient still has neurological deficits with left-sided weakness and  trouble speaking - He says he has not missed any doses of Eliquis - Management per neurology  Persistent Aflutter/fib Patient noted to be back in afib/flutter with rates 90-100 on admission - Patient has h/o of recent successful cardioversion on eliquis. He denies missing doses of his meds. - Patient had a f/u in afib clinic yesterday but did not follow up - Eliquis on hold for acute stroke - Further recommendations per neurology - Continue telemetry - Will not likely plan for cardioversion, plan for rate control  NICM Cardiomyopathy/Chronic systolic CHF s/p ICD - Echo with new decrease in EF 15-20 % last admission - Recent cath showed nonobstructive CAD - Does not appear to be in acute heart failure - Patient reports compliance with meds  HTN - BP management per neurology >>SBP goal of 120-160 with clevidipine   For questions or updates, please contact Bernard Please consult [email or web address] for contact info under     Signed, Cadence Ninfa Meeker, PA-C  11/11/2018 1:19 PM   Attending Note:   The patient was seen and examined.  Agree with assessment and plan as noted above.  Changes made to the above note as needed.  Patient seen and independently examined with  Cadence Kathlen Mody, PA .   We discussed all aspects of the encounter. I agree with the assessment and plan as stated above.  1.   Atrial fib:   Pt was just cardioverted on Sept. 16, 2020.  Unfortunately, he presented to the ED in acute CHF later that day.  He was ultimately admitted yesterday with a large  Subacute hemorrhagic infarct in the right MCA distribution .  Is back in atrial fib.  At present, we are very limited in what we can do for the Afib.  HR is well controlled.   Not a candidate for anticoagulation.    At this time, we will leave him in atrial fib and continue rate control strategy.    2.  Acute on chronic combined CHF:   Echocardiogram on September 10 reveals severely depressed left ventricular  systolic function with an ejection fraction of 15 to 20%.  He has moderate pulmonary hypertension with an estimated PA pressure of 45. Continue Entresto, lasix,  Coreg. At present he is not a candidate for any advanced CHF therapies given his large CVA and significant deficits.   3.  Acute CVA:  Further plans per neuro.     I have spent a total of 40 minutes with patient reviewing hospital  notes , telemetry, EKGs, labs and examining patient as well as establishing an assessment and plan that was discussed with the patient. > 50% of time was spent  in direct patient care.    Thayer Headings, Brooke Bonito., MD, [facility] 11/11/2018, 2:48 PM [address]  [place] Pager [phone]

## 2018-11-11 NOTE — Progress Notes (Signed)
Rehab Admissions Coordinator Note:  Patient was screened by Michel Santee for appropriateness for an Inpatient Acute Rehab Consult.  At this time, we are recommending Inpatient Rehab consult.  Michel Santee 11/11/2018, 2:39 PM  I can be reached at [phone].

## 2018-11-11 NOTE — Progress Notes (Signed)
STROKE TEAM PROGRESS NOTE   INTERVAL HISTORY Pt lying in bed. Dysarthria but orientated, left facial droop and left hemiparesis. Repeat CT stable stroke, HT and MLS. Will d/c 3% saline. Pending swallow.   Vitals:   11/11/18 0400 11/11/18 0500 11/11/18 0600 11/11/18 0700  BP: 122/76 131/84 (!) 135/104 135/65  Pulse: (!) 50 (!) 49 (!) 37 83  Resp: (!) 23 (!) 22 (!) 24 (!) 21  Temp: 98.6 F (37 C)     TempSrc: Axillary     SpO2: 97% 99% 99% 96%    CBC:  Recent Labs  Lab 11/06/18 0529 11/10/18 1753 11/10/18 1801 11/11/18 0329  WBC 5.0 10.5  --  8.6  NEUTROABS 2.0 8.1*  --   --   HGB 11.8* 16.4 17.7* 13.7  HCT 36.4* 47.6 52.0 40.4  MCV 99.2 94.3  --  95.5  PLT 176 201  --  145*    Basic Metabolic Panel:  Recent Labs  Lab 11/06/18 0529 11/10/18 1753 11/10/18 1801  11/11/18 0329 11/11/18 0546  NA 137 137 137   < > 174* 140  K 3.9 4.7 4.5  --  3.8  --   CL 99 103 102  --  >130*  --   CO2 28 21*  --   --  23  --   GLUCOSE 124* 126* 126*  --  110*  --   BUN 15 15 17   --  12  --   CREATININE 1.48* 1.54* 1.40*  --  1.23  --   CALCIUM 8.9 10.0  --   --  7.6*  --   MG 2.0  --   --   --  1.6*  --    < > = values in this interval not displayed.   Lipid Panel:     Component Value Date/Time   CHOL 135 11/11/2018 0329   TRIG 75 11/11/2018 0329   HDL 24 (L) 11/11/2018 0329   CHOLHDL 5.6 11/11/2018 0329   VLDL 15 11/11/2018 0329   LDLCALC 96 11/11/2018 0329   HgbA1c:  Lab Results  Component Value Date   HGBA1C 4.9 11/11/2018   Urine Drug Screen:     Component Value Date/Time   LABOPIA NONE DETECTED 10/30/2018 0146   COCAINSCRNUR NONE DETECTED 10/30/2018 0146   LABBENZ POSITIVE (A) 10/30/2018 0146   AMPHETMU NONE DETECTED 10/30/2018 0146   THCU NONE DETECTED 10/30/2018 0146   LABBARB NONE DETECTED 10/30/2018 0146    Alcohol Level     Component Value Date/Time   ETH 8 (H) 09/05/2016 2020    IMAGING Ct Angio Head W Or Wo Contrast  Result Date:  11/10/2018 CLINICAL DATA:  Stroke with left-sided weakness and slurred speech EXAM: CT ANGIOGRAPHY HEAD AND NECK TECHNIQUE: Multidetector CT imaging of the head and neck was performed using the standard protocol during bolus administration of intravenous contrast. Multiplanar CT image reconstructions and MIPs were obtained to evaluate the vascular anatomy. Carotid stenosis measurements (when applicable) are obtained utilizing NASCET criteria, using the distal internal carotid diameter as the denominator. CONTRAST:  59mL OMNIPAQUE IOHEXOL 350 MG/ML SOLN COMPARISON:  CT head 11/10/2018 FINDINGS: CTA NECK FINDINGS Aortic arch: Standard branching. Imaged portion shows no evidence of aneurysm or dissection. No significant stenosis of the major arch vessel origins. Right carotid system: Calcified and noncalcified plaque right carotid bulb narrowing the lumen by approximately 35-40% diameter stenosis. Left carotid system: Mild atherosclerotic disease left carotid bifurcation without significant stenosis. Vertebral arteries: Both vertebral arteries  patent to the basilar without significant stenosis. Skeleton: Cervical spondylosis without acute abnormality. Other neck: Negative Upper chest: Negative Review of the MIP images confirms the above findings CTA HEAD FINDINGS Anterior circulation: Heavily calcified right cavernous carotid with moderate stenosis. Irregular subtotal occlusion of the right M1 segment with good distal flow. The area of stenosis has irregularity most compatible with thrombus. Left middle cerebral artery widely patent. Anterior cerebral arteries patent bilaterally without significant stenosis. Posterior circulation: Both vertebral arteries patent to the basilar. PICA patent bilaterally. Atherosclerotic irregularity and mild to moderate stenosis in the mid and distal basilar artery. Superior cerebellar and posterior cerebral arteries patent bilaterally. Mild atherosclerotic disease in the posterior  cerebral arteries bilaterally. Venous sinuses: Patent Anatomic variants: None Review of the MIP images confirms the above findings IMPRESSION: 1. Subtotal occlusion right M1 segment with irregularity most consistent with thrombus. Good distal flow. Associated hemorrhagic infarct right MCA territory noted on earlier CT head. Moderate atherosclerotic stenosis right cavernous carotid. 35-40% diameter stenosis right internal carotid artery stenosis at the origin. 2. Left carotid widely patent with mild atherosclerotic disease at the bifurcation. Both vertebral arteries widely patent. 3. Mild moderate basilar stenosis. Mild atherosclerotic disease posterior cerebral artery bilaterally. 4. These results were called by telephone at the time of interpretation on 11/10/2018 at 6:45 pm to provider ERIC [facility] , who verbally acknowledged these results. 5. Electronically Signed   By: Franchot Gallo M.D.   On: 11/10/2018 18:56   Ct Angio Neck W Or Wo Contrast  Result Date: 11/10/2018 CLINICAL DATA:  Stroke with left-sided weakness and slurred speech EXAM: CT ANGIOGRAPHY HEAD AND NECK TECHNIQUE: Multidetector CT imaging of the head and neck was performed using the standard protocol during bolus administration of intravenous contrast. Multiplanar CT image reconstructions and MIPs were obtained to evaluate the vascular anatomy. Carotid stenosis measurements (when applicable) are obtained utilizing NASCET criteria, using the distal internal carotid diameter as the denominator. CONTRAST:  55mL OMNIPAQUE IOHEXOL 350 MG/ML SOLN COMPARISON:  CT head 11/10/2018 FINDINGS: CTA NECK FINDINGS Aortic arch: Standard branching. Imaged portion shows no evidence of aneurysm or dissection. No significant stenosis of the major arch vessel origins. Right carotid system: Calcified and noncalcified plaque right carotid bulb narrowing the lumen by approximately 35-40% diameter stenosis. Left carotid system: Mild atherosclerotic disease left carotid  bifurcation without significant stenosis. Vertebral arteries: Both vertebral arteries patent to the basilar without significant stenosis. Skeleton: Cervical spondylosis without acute abnormality. Other neck: Negative Upper chest: Negative Review of the MIP images confirms the above findings CTA HEAD FINDINGS Anterior circulation: Heavily calcified right cavernous carotid with moderate stenosis. Irregular subtotal occlusion of the right M1 segment with good distal flow. The area of stenosis has irregularity most compatible with thrombus. Left middle cerebral artery widely patent. Anterior cerebral arteries patent bilaterally without significant stenosis. Posterior circulation: Both vertebral arteries patent to the basilar. PICA patent bilaterally. Atherosclerotic irregularity and mild to moderate stenosis in the mid and distal basilar artery. Superior cerebellar and posterior cerebral arteries patent bilaterally. Mild atherosclerotic disease in the posterior cerebral arteries bilaterally. Venous sinuses: Patent Anatomic variants: None Review of the MIP images confirms the above findings IMPRESSION: 1. Subtotal occlusion right M1 segment with irregularity most consistent with thrombus. Good distal flow. Associated hemorrhagic infarct right MCA territory noted on earlier CT head. Moderate atherosclerotic stenosis right cavernous carotid. 35-40% diameter stenosis right internal carotid artery stenosis at the origin. 2. Left carotid widely patent with mild atherosclerotic disease at the bifurcation. Both  vertebral arteries widely patent. 3. Mild moderate basilar stenosis. Mild atherosclerotic disease posterior cerebral artery bilaterally. 4. These results were called by telephone at the time of interpretation on 11/10/2018 at 6:45 pm to provider ERIC [facility] , who verbally acknowledged these results. 5. Electronically Signed   By: Franchot Gallo M.D.   On: 11/10/2018 18:56   Dg Chest Portable 1 View  Result Date:  11/10/2018 CLINICAL DATA:  Stroke EXAM: PORTABLE CHEST 1 VIEW COMPARISON:  10/29/2018 FINDINGS: Left AICD remains in place, unchanged. Cardiomegaly. No confluent opacities, effusions or edema. No acute bony abnormality. IMPRESSION: Cardiomegaly.  No active disease. Electronically Signed   By: Rolm Baptise M.D.   On: 11/10/2018 19:58   Ct Head Code Stroke Wo Contrast  Result Date: 11/10/2018 CLINICAL DATA:  Code stroke.  Left-sided weakness slurred speech EXAM: CT HEAD WITHOUT CONTRAST TECHNIQUE: Contiguous axial images were obtained from the base of the skull through the vertex without intravenous contrast. COMPARISON:  CT head 09/07/2016 FINDINGS: Brain: Large area of cortical edema in the right frontal lobe. There is also involvement of the insula and basal ganglia which show abnormal hypodensity. Hyperdensity in the right head of caudate and anterior basal ganglia compatible with recent hemorrhage. Mild subfalcine herniation of right frontal lobe due to edema. Ventricle size normal.  No other acute infarct or mass Vascular: Negative for hyperdense vessel Skull: Negative Sinuses/Orbits: Mild mucosal edema paranasal sinuses. Negative orbit. Other: None ASPECTS ([place] Stroke Program Early CT Score) - Ganglionic level infarction (caudate, lentiform nuclei, internal capsule, insula, M1-M3 cortex): 1 - Supraganglionic infarction (M4-M6 cortex): 2 Total score (0-10 with 10 being normal): 3 IMPRESSION: 1. Large territory subacute hemorrhagic infarct right MCA territory. Area of hemorrhage measures approximately 2 x 3 cm centered in the head of the caudate and putamen on the right. The blood likely is subacute. Local mass-effect and mild subfalcine herniation due to edema. 2. ASPECTS is 3 3. Emergent results communicated with Dr. Cheral Marker via text page. Electronically Signed   By: Franchot Gallo M.D.   On: 11/10/2018 18:13    PHYSICAL EXAM  Temp:  [98.6 F (37 C)-99.2 F (37.3 C)] 98.6 F (37 C) (09/22  0400) Pulse Rate:  [36-126] 36 (09/22 0800) Resp:  [16-27] 22 (09/22 0800) BP: (93-163)/(55-104) 116/64 (09/22 0800) SpO2:  [93 %-99 %] 96 % (09/22 0800)  General - Well nourished, well developed, in no apparent distress.  Ophthalmologic - fundi not visualized due to noncooperation.  Cardiovascular - irregularly irregular heart rate and rhythm.  Mental Status -  Level of arousal and orientation to time, place, and person were intact. Language including expression, naming, repetition, comprehension was assessed and found intact. Moderate dysarthria. Left sensory neglect  Cranial Nerves II - XII - II - Visual field intact OU. III, IV, VI - Extraocular movements intact. V - Facial sensation intact bilaterally. VII - left facial drrop. VIII - Hearing & vestibular intact bilaterally. X - Palate elevates symmetrically. XI - Chin turning & shoulder shrug intact bilaterally. XII - Tongue protrusion intact.  Motor Strength - The patient's strength was normal in right UE and LE extremities, however, LUE 3-/5 deltoid, 3+/5 bicep, 4/5 tricep and 2/5 hand grip. LLE proximal 3+/5, knee extension 4/5 and toe DF 5/5.  Bulk was normal and fasciculations were absent.   Motor Tone - Muscle tone was assessed at the neck and appendages and was normal.  Reflexes - The patient's reflexes were symmetrical in all extremities and he had no  pathological reflexes.  Sensory - Light touch, temperature/pinprick were assessed and were subjectively symmetrical.  However, pt does have sensory neglect on the left  Coordination - The patient had normal movements in the right hand with no ataxia or dysmetria.  Tremor was absent.  Gait and Station - deferred.   ASSESSMENT/PLAN Glenn [name] is a 68 y.o. male with history of AF on Eliquis, CHF, hepatitis C, HTN, CM w/ low EF, ICD placement and prior stroke just discharged from hospital 4 days prior for AF w/ RVR s/p cardioversion, volume overload and AKI,  now presenting with L sided weakness and neglect..   Stroke:  Large R MCA infarct with hemorrhagic conversion, embolic secondary to known AF on Eliquis s/p reversal w/ Kcentra.   Code Stroke CT head large subacute hemorrhagic infarct R MCA (caudate head, putamen). Local mass effect ad mild subfalcine herniation.  ASPECTS 3.    CTA head & neck subtotal occlusion R M1 c/w thrombus w/ hemorrhagic infarct R MCA. R ICA 35-40% at origin. Mild L ICA atherosclerosis. Mild to mod BA stenosis and B PCAs.  CT head no significant change in infarct. Hemorrhage in R caudate head into R basal ganglia similar. Small R lateral ventricle hemorrhage stable. Mass effect w/ partial effacement R lateral frontal horn, R to L subfalcine herniation and 2 mm midline shift similar.   2D Echo 10/30/18 EF 15-20%. AFib. AV regurg.  LDL 96  HgbA1c 4.9  SCDs for VTE prophylaxis  Eliquis (apixaban) daily prior to admission, now on No antithrombotic.   Therapy recommendations:  CIR. Consult placed  Disposition:  pending   Cerebral Edema  Started on hypertonic saline protocol via PIV  Na 140  3% saline discontinued given mild MLS and severe CHF  On NS now  Acute on chronic Congestive heart failure Nonischemic cardiomyopathy EF 15-20% s/p ICD  Recent hospitalizaiton  on Entresto bid,  tikosyn 250 bid, spironolactone 12.5 daily, lasix, and coreg  Cardiology consult, appreciate help  Continue home meds  BP montioring  Atrial Fibrillation  Recent cardioversion w/ resultant admission   Home anticoagulation:  Eliquis (apixaban) daily   Cardiology on board  Hold off anticoagulation at this time due to hemorrhage  Hx stroke/TIA  08/2016 - L MCA s/p IR for L M2 occlusion with taking 3 reperfusion.  EF 30 to 35%.  Repeat CT no acute abnormality.  Did not do MRI due to ICD.  LDL 44, A1c 4.7.  His Xarelto changed to Eliquis and aspirin.  Hypertension  Home meds:  Coreg 25 bid, Lasix, spironolactone,  Entresto  Off cleviprex  Home meds resumed . SBP goal <160   Stable  Close monitoring  Hyperlipidemia  Home meds:  No statin  LDL 96, goal < 70  Put on Lipitor 20  Continue statin on discharge  Dysphagia . Secondary to stroke . NPO . Speech on board . Meds crushed with pure . Continue IV fluid at 40 cc  Tobacco abuse  Current smoker  Smoking cessation counseling provided  Pt is willing to quit   Other Stroke Risk Factors  Advanced age  ETOH use, advised to drink no more than 2 drink(s) a day  Substance abuse - hx THC and cocaine use - has not used cocaine in years but smokes marijuana daily.   Family hx stroke (maternal aunt)  Other Active Problems  Chronic Hep C s/p treatment  AKI, Cre 1.54-1.4-1.23  Hospital day # 1  This patient is critically ill  due to large right MCA infarct with hemorrhagic conversion, severe cardiomyopathy, A. fib RVR, dysphagia, CHF and at significant risk of neurological worsening, death form cerebral edema, hematoma expansion, brain herniation, heart failure, seizure, aspiration pneumonia and respiratory failure. This patient's care requires constant monitoring of vital signs, hemodynamics, respiratory and cardiac monitoring, review of multiple databases, neurological assessment, discussion with family, other specialists and medical decision making of high complexity. I spent 40 minutes of neurocritical care time in the care of this patient.  Rosalin Hawking, MD PhD Stroke Neurology 11/11/2018 6:17 PM   To contact Stroke Continuity provider, please refer to [email or web address] After hours, contact General Neurology

## 2018-11-11 NOTE — Evaluation (Signed)
Speech Language Pathology Evaluation Patient Details Name: Glenn [name] MRN: [id number] DOB: 02/25/1950 Today's Date: 11/11/2018 Time: [id number] SLP Time Calculation (min) (ACUTE ONLY): 13 min  Problem List:  Patient Active Problem List   Diagnosis Date Noted  . Wide-complex tachycardia ([place])   . DCM (dilated cardiomyopathy) ([place])   . Atrial fibrillation ([place]) 11/04/2018  . Acute on chronic systolic heart failure ([place])   . Prolonged Q-T interval on ECG   . Elevated troponin   . CHF exacerbation ([place]) 10/30/2018  . Acute respiratory failure with hypoxia ([place])   . Acute kidney injury ([place])   . Entrapment of right ulnar nerve 02/28/2018  . Carpal tunnel syndrome of right wrist 02/28/2018  . Impotence due to erectile dysfunction 09/30/2017  . Solitary pulmonary nodule 06/10/2017  . Neck pain 04/06/2017  . Paroxysmal atrial fibrillation ([name])   . Visit for monitoring Tikosyn therapy 03/25/2017  . ICD (implantable cardioverter-defibrillator) in place 09/13/2016  . Chest pain 09/13/2016  . Tobacco abuse 09/13/2016  . Hospital discharge follow-up 09/13/2016  . Upper back pain 09/13/2016  . Housing problems 09/13/2016  . Persistent atrial fibrillation   . Atrial fibrillation with RVR ([place])   . Ischemic cardiomyopathy   . Cerebral infarction ([place])   . Stroke (cerebrum) ([place]) 09/05/2016  . CHF (congestive heart failure) ([place]) 08/14/2016  . HTN (hypertension) 08/14/2016  . Chronic Hepatitis C  08/14/2016   Past Medical History:  Past Medical History:  Diagnosis Date  . Atrial fibrillation ([place])   . CHF (congestive heart failure) ([place])   . Hepatitis C   . Hypertension   . Stroke ([place])   . Visit for monitoring Tikosyn therapy 03/26/2017   Past Surgical History:  Past Surgical History:  Procedure Laterality Date  . CARDIAC DEFIBRILLATOR PLACEMENT  2015  . CARDIOVERSION N/A 10/10/2016   Procedure: CARDIOVERSION;  Surgeon: Dorothy Spark, MD;  Location: [facility];   Service: Cardiovascular;  Laterality: N/A;  . CARDIOVERSION N/A 03/27/2017   Procedure: CARDIOVERSION;  Surgeon: Jerline Pain, MD;  Location: [facility] ENDOSCOPY;  Service: Cardiovascular;  Laterality: N/A;  . CARDIOVERSION N/A 10/29/2018   Procedure: CARDIOVERSION;  Surgeon: Sanda Klein, MD;  Location: [facility] ENDOSCOPY;  Service: Cardiovascular;  Laterality: N/A;  . CARDIOVERSION N/A 11/05/2018   Procedure: CARDIOVERSION;  Surgeon: Acie Fredrickson Wonda Cheng, MD;  Location: [facility];  Service: Cardiovascular;  Laterality: N/A;  . EYE SURGERY Left 1990  . IR PERCUTANEOUS ART THROMBECTOMY/INFUSION INTRACRANIAL INC DIAG ANGIO  09/05/2016  . IR RADIOLOGIST EVAL & MGMT  10/03/2016  . RADIOLOGY WITH ANESTHESIA N/A 09/05/2016   Procedure: RADIOLOGY WITH ANESTHESIA;  Surgeon: Luanne Bras, MD;  Location: [facility];  Service: Radiology;  Laterality: N/A;  . RIGHT/LEFT HEART CATH AND CORONARY ANGIOGRAPHY N/A 11/03/2018   Procedure: RIGHT/LEFT HEART CATH AND CORONARY ANGIOGRAPHY;  Surgeon: Lorretta Harp, MD;  Location: [facility];  Service: Cardiovascular;  Laterality: N/A;   HPI:  Glenn [name] is an 68 y.o. male with atrial fibrillation on Eliquis, CHF, hepatitis C, HTN, recent cardiac defibrillator placement and prior stroke admitted with L side weakness, slurred speech, and altered mental status. CT showed large subacute hermorrhagic infarct R MCA. CXR was unremarkable. BSE 09/07/16 revealed functional swallow and regular/thin liquids rec'd. Pt was given the [facility] and RN reported coughing and choking, currently NPO.   Assessment / Plan / Recommendation Clinical Impression  Pt states he did not have residual deficits from CVA in 2018. He exhibits impairments in speech  intelligibility, awareness and mildly complex problem solving. Intermittent dysfluency on initial word of sentences that pt reported started several days ago. Continued ST to address deficits moving toward independence in an inpatient  setting.          SLP Assessment  SLP Recommendation/Assessment: Patient needs continued Speech Lanaguage Pathology Services SLP Visit Diagnosis: Dysphagia, unspecified (R13.10)    Follow Up Recommendations  Inpatient Rehab    Frequency and Duration min 2x/week  2 weeks      SLP Evaluation Cognition  Overall Cognitive Status: Impaired/Different from baseline Arousal/Alertness: Awake/alert Orientation Level: Oriented to person;Oriented to place;Oriented to situation Attention: Sustained Sustained Attention: Appears intact Memory: (TBA further suspect deficits) Awareness: Impaired Awareness Impairment: Emergent impairment;Anticipatory impairment Problem Solving: Impaired Problem Solving Impairment: Functional complex Safety/Judgment: Impaired       Comprehension  Auditory Comprehension Overall Auditory Comprehension: Appears within functional limits for tasks assessed Visual Recognition/Discrimination Discrimination: Not tested Reading Comprehension Reading Status: (TBA)    Expression Expression Primary Mode of Expression: Verbal Verbal Expression Overall Verbal Expression: Impaired Initiation: Impaired(intermittent dysfluency) Level of Generative/Spontaneous Verbalization: Conversation Repetition: (NT) Naming: Not tested Pragmatics: No impairment Written Expression Dominant Hand: Right Written Expression: (TBA)   Oral / Motor  Oral Motor/Sensory Function Overall Oral Motor/Sensory Function: Moderate impairment Facial ROM: Reduced left;Suspected CN VII (facial) dysfunction Facial Symmetry: Suspected CN VII (facial) dysfunction;Abnormal symmetry left Facial Strength: Reduced left;Suspected CN VII (facial) dysfunction Facial Sensation: Other (Comment)(decr sensation to puree) Lingual ROM: Within Functional Limits Lingual Symmetry: Abnormal symmetry left Motor Speech Overall Motor Speech: Impaired Respiration: Within functional limits Phonation:  Normal Resonance: Within functional limits Articulation: Impaired Level of Impairment: Conversation Intelligibility: Intelligibility reduced Word: 75-100% accurate Phrase: 75-100% accurate Sentence: 75-100% accurate Conversation: 75-100% accurate Motor Planning: (will continue to assess)   GO                        11/11/2018, 4:18 PM  [name] Carrigan Delafuente M.Ed Risk analyst [phone] Office [phone]

## 2018-11-11 NOTE — Evaluation (Signed)
Clinical/Bedside Swallow Evaluation Patient Details  Name: Glenn [name] MRN: [id number] Date of Birth: [birth year]  Today's Date: 11/11/2018 Time: SLP Start Time (ACUTE ONLY): 1124 SLP Stop Time (ACUTE ONLY): 1149 SLP Time Calculation (min) (ACUTE ONLY): 11 min  Past Medical History:  Past Medical History:  Diagnosis Date  . Atrial fibrillation ([place])   . CHF (congestive heart failure) ([place])   . Hepatitis C   . Hypertension   . Stroke ([place])   . Visit for monitoring Tikosyn therapy 03/26/2017   Past Surgical History:  Past Surgical History:  Procedure Laterality Date  . CARDIAC DEFIBRILLATOR PLACEMENT  2015  . CARDIOVERSION N/A 10/10/2016   Procedure: CARDIOVERSION;  Surgeon: Dorothy Spark, MD;  Location: [facility];  Service: Cardiovascular;  Laterality: N/A;  . CARDIOVERSION N/A 03/27/2017   Procedure: CARDIOVERSION;  Surgeon: Jerline Pain, MD;  Location: [facility] ENDOSCOPY;  Service: Cardiovascular;  Laterality: N/A;  . CARDIOVERSION N/A 10/29/2018   Procedure: CARDIOVERSION;  Surgeon: Sanda Klein, MD;  Location: [facility] ENDOSCOPY;  Service: Cardiovascular;  Laterality: N/A;  . CARDIOVERSION N/A 11/05/2018   Procedure: CARDIOVERSION;  Surgeon: Acie Fredrickson Wonda Cheng, MD;  Location: [facility];  Service: Cardiovascular;  Laterality: N/A;  . EYE SURGERY Left 1990  . IR PERCUTANEOUS ART THROMBECTOMY/INFUSION INTRACRANIAL INC DIAG ANGIO  09/05/2016  . IR RADIOLOGIST EVAL & MGMT  10/03/2016  . RADIOLOGY WITH ANESTHESIA N/A 09/05/2016   Procedure: RADIOLOGY WITH ANESTHESIA;  Surgeon: Luanne Bras, MD;  Location: [facility];  Service: Radiology;  Laterality: N/A;  . RIGHT/LEFT HEART CATH AND CORONARY ANGIOGRAPHY N/A 11/03/2018   Procedure: RIGHT/LEFT HEART CATH AND CORONARY ANGIOGRAPHY;  Surgeon: Lorretta Harp, MD;  Location: [facility] CV LAB;  Service: Cardiovascular;  Laterality: N/A;   HPI:  Glenn [name] is an 68 y.o. male with atrial fibrillation on Eliquis, CHF, hepatitis  C, HTN, recent cardiac defibrillator placement and prior stroke admitted with L side weakness, slurred speech, and altered mental status. CT showed large subacute hermorrhagic infarct R MCA. CXR was unremarkable. BSE 09/07/16 revealed functional swallow and regular/thin liquids rec'd. Pt was given the [facility] and RN reported coughing and choking, currently NPO.   Assessment / Plan / Recommendation Clinical Impression  Pharyngeal congestion audible at baseline and mobilized small amount to oral cavity for suctioning. Not completely swallowing saliva timely and efficiently evidenced by anterior and left sulci pooling. He coughed immediately and delayed with water and throat clearing x 1 with puree. Motor, coordination and timing of swallow needs to be objectively assessed prior to beginning meal trays. He can have meds crushed (if able) in applesauce until MBS tomorrow.   SLP Visit Diagnosis: Dysphagia, unspecified (R13.10)    Aspiration Risk  Moderate aspiration risk    Diet Recommendation NPO except meds   Medication Administration: Crushed with puree    Other  Recommendations Oral Care Recommendations: Oral care QID   Follow up Recommendations Inpatient Rehab      Frequency and Duration min 2x/week  2 weeks       Prognosis Prognosis for Safe Diet Advancement: Good      Swallow Study   General HPI: Glenn [name] is an 68 y.o. male with atrial fibrillation on Eliquis, CHF, hepatitis C, HTN, recent cardiac defibrillator placement and prior stroke admitted with L side weakness, slurred speech, and altered mental status. CT showed large subacute hermorrhagic infarct R MCA. CXR was unremarkable. BSE 09/07/16 revealed functional swallow and regular/thin liquids rec'd. Pt was given the [employer]  and RN reported coughing and choking, currently NPO. Type of Study: Bedside Swallow Evaluation Previous Swallow Assessment: (see HPI) Diet Prior to this Study: NPO Temperature Spikes Noted:  No Respiratory Status: Room air History of Recent Intubation: No Behavior/Cognition: Alert;Cooperative;Pleasant mood;Requires cueing Oral Cavity Assessment: Within Functional Limits Oral Care Completed by SLP: Yes Oral Cavity - Dentition: Adequate natural dentition Vision: Functional for self-feeding Self-Feeding Abilities: Needs assist Patient Positioning: Upright in chair Baseline Vocal Quality: Other (comment)(pharyngeal congestion audible ) Volitional Cough: Strong    Oral/Motor/Sensory Function Overall Oral Motor/Sensory Function: Moderate impairment Facial ROM: Reduced left;Suspected CN VII (facial) dysfunction Facial Symmetry: Suspected CN VII (facial) dysfunction;Abnormal symmetry left Facial Strength: Reduced left;Suspected CN VII (facial) dysfunction Facial Sensation: Other (Comment)(decr sensation to puree) Lingual ROM: Within Functional Limits Lingual Symmetry: Abnormal symmetry left   Ice Chips Ice chips: Within functional limits   Thin Liquid Thin Liquid: Impaired Presentation: Cup;Spoon Oral Phase Impairments: Reduced labial seal Oral Phase Functional Implications: Left anterior spillage Pharyngeal  Phase Impairments: Cough - Immediate;Cough - Delayed;Throat Clearing - Delayed    Nectar Thick Nectar Thick Liquid: Not tested   Honey Thick Honey Thick Liquid: Not tested   Puree Puree: Impaired Presentation: Spoon Oral Phase Impairments: Other (comment)(labial residue) Oral Phase Functional Implications: Other (comment)(labial residue)   Solid     Solid: Not tested      Glenn [name] 11/11/2018,4:04 PM  [name] Colvin Caroli.Ed Risk analyst [phone] Office [phone]

## 2018-11-11 NOTE — Progress Notes (Signed)
ECG taken per V. O. By Dr. Neena Rhymes. ECG not uploaded due to ECG machine inability. ECG placed in pt's chart.

## 2018-11-11 NOTE — Evaluation (Signed)
Physical Therapy Evaluation Patient Details Name: Glenn [name] MRN: [id number] DOB: 06/08/1950 Today's Date: 11/11/2018   History of Present Illness  68 yo male presenting with left sided weakness and left neglect. CT showing large territory subacute hemorrhagic infarct right MCA territory, and ischemic stroke which measures approximately 2 x 3 cm centered in the head of the caudate and putamen on the right. PMH including atrial fibrillation on Eliquis, CHF, hepatitis C, HTN, recent cardiac defibrillator placement and prior stroke.  Clinical Impression  Patient presents with decreased mobility due to decreased L side awareness, decreased balance, decreased deficit awareness, decreased L UE/LE strength, decreased activity tolerance and he will benefit from skilled PT in the acute setting to maximize mobility and safety.  Previously he was independence, currently +2 A for safety with hallway ambulation with RW with cues for L LE steps. Feel he will benefit from CIR level rehab upon d/c.      Follow Up Recommendations CIR    Equipment Recommendations  Other (comment)(TBA)    Recommendations for Other Services Rehab consult     Precautions / Restrictions Precautions Precautions: Fall Precaution Comments: Poor awareness, L inattention      Mobility  Bed Mobility Overal bed mobility: Needs Assistance Bed Mobility: Supine to Sit     Supine to sit: Min assist     General bed mobility comments: reached for hand held assist to come up to sit and needed cues for righting trunk in sitting  Transfers Overall transfer level: Needs assistance Equipment used: Rolling walker (2 wheeled) Transfers: Sit to/from Stand Sit to Stand: Min guard;Min assist;+2 safety/equipment         General transfer comment: Pt performing sit>stand with Min guard A for safety and assistance to place LUE onto RW; pt pulling with RUE on RW. Pt requiring Min A for safe descent to  recliner  Ambulation/Gait Ambulation/Gait assistance: Mod assist;+2 physical assistance Gait Distance (Feet): 65 Feet Assistive device: Rolling walker (2 wheeled) Gait Pattern/deviations: Step-to pattern;Decreased stride length;Decreased step length - left;Decreased dorsiflexion - left;Steppage;Shuffle     General Gait Details: leaning L and with dragging L foot at times, over time with increased L lateral lean and needing max facilitation for R weight shift for balance and to clear L foot.  Patient felt as if I was contributing to his LOB by assisting him to weight bear on R LE.  Stairs            Wheelchair Mobility    Modified Rankin (Stroke Patients Only) Modified Rankin (Stroke Patients Only) Pre-Morbid Rankin Score: No symptoms Modified Rankin: Moderately severe disability     Balance Overall balance assessment: Needs assistance Sitting-balance support: No upper extremity supported;Feet supported Sitting balance-Leahy Scale: Fair     Standing balance support: Bilateral upper extremity supported;During functional activity Standing balance-Leahy Scale: Poor Standing balance comment: Reliant on physical A and UE support                             Pertinent Vitals/Pain Pain Assessment: No/denies pain    Home Living Family/patient expects to be discharged to:: Private residence Living Arrangements: Non-relatives/Friends(? girlfriend) Available Help at Discharge: Friend(s);Available PRN/intermittently;Available 24 hours/day Type of Home: House Home Access: Stairs to enter   [employer] of Steps: 5 Home Layout: One level Home Equipment: None      Prior Function Level of Independence: Independent         Comments: Worked as  a Solicitor Dominance   Dominant Hand: Right    Extremity/Trunk Assessment   Upper Extremity Assessment Upper Extremity Assessment: Defer to OT evaluation LUE Deficits / Details: Decreased strength  and coorindation. Poor grasp as seen during MMT, ADLs, and mobility. Pt unable to perform finger opposition. Pt able to lift arm off the bed.  LUE Coordination: decreased fine motor;decreased gross motor    Lower Extremity Assessment Lower Extremity Assessment: LLE deficits/detail LLE Deficits / Details: AROM WFL, but noted hip adductor, IR weakness with leg out to side with hip flexion in supine, knee extension 3/5, ankle DF 3+/5 LLE Sensation: (reports intact to light touch, but needs more formal assessment)    Cervical / Trunk Assessment Cervical / Trunk Assessment: Other exceptions Cervical / Trunk Exceptions: Poor trunk control with left lateral lean in standing  Communication   Communication: Expressive difficulties;Receptive difficulties  Cognition Arousal/Alertness: Awake/alert Behavior During Therapy: WFL for tasks assessed/performed Overall Cognitive Status: Impaired/Different from baseline Area of Impairment: Orientation;Attention;Memory;Following commands;Safety/judgement;Awareness;Problem solving                 Orientation Level: Disoriented to;Place;Time;Situation Current Attention Level: Sustained Memory: Decreased recall of precautions;Decreased short-term memory Following Commands: Follows one step commands inconsistently;Follows one step commands with increased time Safety/Judgement: Decreased awareness of safety;Decreased awareness of deficits Awareness: Intellectual Problem Solving: Slow processing;Difficulty sequencing;Requires verbal cues;Requires tactile cues General Comments: Pt requiring increased cues and time throughout session. Pt with poor awareness of deficits. Pt with poor attention of left side (body and environment)      General Comments General comments (skin integrity, edema, etc.): BP 128/73 and at end of session 153/141, then 125/89    Exercises     Assessment/Plan    PT Assessment Patient needs continued PT services  PT Problem List  Decreased strength;Decreased activity tolerance;Decreased balance;Decreased mobility;Decreased cognition;Decreased knowledge of use of DME;Decreased safety awareness;Decreased knowledge of precautions       PT Treatment Interventions DME instruction;Stair training;Therapeutic activities;Balance training;Cognitive remediation;Patient/family education;Therapeutic exercise;Functional mobility training;Gait training    PT Goals (Current goals can be found in the Care Plan section)  Acute Rehab PT Goals Patient Stated Goal: "Go home and see my puppy" PT Goal Formulation: With patient Time For Goal Achievement: 11/25/18 Potential to Achieve Goals: Good    Frequency Min 4X/week   Barriers to discharge        Co-evaluation PT/OT/SLP Co-Evaluation/Treatment: Yes Reason for Co-Treatment: For patient/therapist safety;To address functional/ADL transfers PT goals addressed during session: Mobility/safety with mobility;Balance;Proper use of DME OT goals addressed during session: ADL's and self-care       AM-PAC PT "6 Clicks" Mobility  Outcome Measure Help needed turning from your back to your side while in a flat bed without using bedrails?: A Little Help needed moving from lying on your back to sitting on the side of a flat bed without using bedrails?: A Little Help needed moving to and from a bed to a chair (including a wheelchair)?: A Little Help needed standing up from a chair using your arms (e.g., wheelchair or bedside chair)?: A Little Help needed to walk in hospital room?: A Lot Help needed climbing 3-5 steps with a railing? : Total 6 Click Score: 15    End of Session Equipment Utilized During Treatment: Gait belt Activity Tolerance: Patient tolerated treatment well Patient left: in chair;with call bell/phone within reach;with chair alarm set   PT Visit Diagnosis: Other abnormalities of gait and mobility (R26.89);Hemiplegia and hemiparesis Hemiplegia -  Right/Left:  Left Hemiplegia - dominant/non-dominant: Non-dominant Hemiplegia - caused by: Cerebral infarction;Nontraumatic intracerebral hemorrhage    Time: [id number] PT Time Calculation (min) (ACUTE ONLY): 28 min   Charges:   PT Evaluation $PT Eval Moderate Complexity: [place], [place] Acute Rehabilitation Services [phone] 11/11/2018   Reginia Naas 11/11/2018, 1:26 PM

## 2018-11-12 ENCOUNTER — Ambulatory Visit (HOSPITAL_COMMUNITY): Payer: Medicare HMO | Admitting: Nurse Practitioner

## 2018-11-12 ENCOUNTER — Ambulatory Visit: Payer: Medicare HMO | Admitting: Family Medicine

## 2018-11-12 ENCOUNTER — Inpatient Hospital Stay (HOSPITAL_COMMUNITY): Payer: Medicare HMO

## 2018-11-12 DIAGNOSIS — F17200 Nicotine dependence, unspecified, uncomplicated: Secondary | ICD-10-CM

## 2018-11-12 DIAGNOSIS — I6389 Other cerebral infarction: Secondary | ICD-10-CM

## 2018-11-12 DIAGNOSIS — I4821 Permanent atrial fibrillation: Secondary | ICD-10-CM

## 2018-11-12 LAB — CBC
HCT: 44 % (ref 39.0–52.0)
Hemoglobin: 14.7 g/dL (ref 13.0–17.0)
MCH: 32.8 pg (ref 26.0–34.0)
MCHC: 33.4 g/dL (ref 30.0–36.0)
MCV: 98.2 fL (ref 80.0–100.0)
Platelets: 121 10*3/uL — ABNORMAL LOW (ref 150–400)
RBC: 4.48 MIL/uL (ref 4.22–5.81)
RDW: 13.2 % (ref 11.5–15.5)
WBC: 8.9 10*3/uL (ref 4.0–10.5)
nRBC: 0 % (ref 0.0–0.2)

## 2018-11-12 LAB — GLUCOSE, CAPILLARY
Glucose-Capillary: 90 mg/dL (ref 70–99)
Glucose-Capillary: 97 mg/dL (ref 70–99)

## 2018-11-12 LAB — BASIC METABOLIC PANEL
Anion gap: 10 (ref 5–15)
BUN: 15 mg/dL (ref 8–23)
CO2: 21 mmol/L — ABNORMAL LOW (ref 22–32)
Calcium: 8.6 mg/dL — ABNORMAL LOW (ref 8.9–10.3)
Chloride: 110 mmol/L (ref 98–111)
Creatinine, Ser: 1.33 mg/dL — ABNORMAL HIGH (ref 0.61–1.24)
GFR calc Af Amer: >60 mL/min (ref >60)
GFR calc non Af Amer: 55 mL/min — ABNORMAL LOW (ref >60)
Glucose, Bld: 105 mg/dL — ABNORMAL HIGH (ref 70–99)
Potassium: 4.5 mmol/L (ref 3.5–5.1)
Sodium: 141 mmol/L (ref 135–145)

## 2018-11-12 NOTE — Progress Notes (Signed)
STROKE TEAM PROGRESS NOTE   INTERVAL HISTORY Pt sitting in bed for lunch. Passed swallow and now on dys 2 with thin liquid. Pt complains of right hip pain due to sitting too long in bed. PT/OT recommend CIR.    Vitals:   11/12/18 0339 11/12/18 0506 11/12/18 0740 11/12/18 1208  BP: 123/85  118/82 126/78  Pulse: 87  75 99  Resp: 17  20 20   Temp: 99.6 F (37.6 C) 98.6 F (37 C) (!) 97.4 F (36.3 C) 98.5 F (36.9 C)  TempSrc: Oral Oral Oral Oral  SpO2: 97%  98% 98%    CBC:  Recent Labs  Lab 11/06/18 0529 11/10/18 1753  11/11/18 0329 11/12/18 0518  WBC 5.0 10.5  --  8.6 8.9  NEUTROABS 2.0 8.1*  --   --   --   HGB 11.8* 16.4   < > 13.7 14.7  HCT 36.4* 47.6   < > 40.4 44.0  MCV 99.2 94.3  --  95.5 98.2  PLT 176 201  --  145* 121*   < > = values in this interval not displayed.    Basic Metabolic Panel:  Recent Labs  Lab 11/06/18 0529  11/11/18 0329 11/11/18 0546 11/12/18 0518  NA 137   < > 174* 140 141  K 3.9   < > 3.8  --  4.5  CL 99   < > >130*  --  110  CO2 28   < > 23  --  21*  GLUCOSE 124*   < > 110*  --  105*  BUN 15   < > 12  --  15  CREATININE 1.48*   < > 1.23  --  1.33*  CALCIUM 8.9   < > 7.6*  --  8.6*  MG 2.0  --  1.6*  --   --    < > = values in this interval not displayed.   Lipid Panel:     Component Value Date/Time   CHOL 135 11/11/2018 0329   TRIG 75 11/11/2018 0329   HDL 24 (L) 11/11/2018 0329   CHOLHDL 5.6 11/11/2018 0329   VLDL 15 11/11/2018 0329   LDLCALC 96 11/11/2018 0329   HgbA1c:  Lab Results  Component Value Date   HGBA1C 4.9 11/11/2018   Urine Drug Screen:     Component Value Date/Time   LABOPIA NONE DETECTED 10/30/2018 0146   COCAINSCRNUR NONE DETECTED 10/30/2018 0146   LABBENZ POSITIVE (A) 10/30/2018 0146   AMPHETMU NONE DETECTED 10/30/2018 0146   THCU NONE DETECTED 10/30/2018 0146   LABBARB NONE DETECTED 10/30/2018 0146    Alcohol Level     Component Value Date/Time   ETH 8 (H) 09/05/2016 2020    IMAGING Ct  Angio Head W Or Wo Contrast  Result Date: 11/10/2018 CLINICAL DATA:  Stroke with left-sided weakness and slurred speech EXAM: CT ANGIOGRAPHY HEAD AND NECK TECHNIQUE: Multidetector CT imaging of the head and neck was performed using the standard protocol during bolus administration of intravenous contrast. Multiplanar CT image reconstructions and MIPs were obtained to evaluate the vascular anatomy. Carotid stenosis measurements (when applicable) are obtained utilizing NASCET criteria, using the distal internal carotid diameter as the denominator. CONTRAST:  78mL OMNIPAQUE IOHEXOL 350 MG/ML SOLN COMPARISON:  CT head 11/10/2018 FINDINGS: CTA NECK FINDINGS Aortic arch: Standard branching. Imaged portion shows no evidence of aneurysm or dissection. No significant stenosis of the major arch vessel origins. Right carotid system: Calcified and noncalcified plaque right carotid bulb  narrowing the lumen by approximately 35-40% diameter stenosis. Left carotid system: Mild atherosclerotic disease left carotid bifurcation without significant stenosis. Vertebral arteries: Both vertebral arteries patent to the basilar without significant stenosis. Skeleton: Cervical spondylosis without acute abnormality. Other neck: Negative Upper chest: Negative Review of the MIP images confirms the above findings CTA HEAD FINDINGS Anterior circulation: Heavily calcified right cavernous carotid with moderate stenosis. Irregular subtotal occlusion of the right M1 segment with good distal flow. The area of stenosis has irregularity most compatible with thrombus. Left middle cerebral artery widely patent. Anterior cerebral arteries patent bilaterally without significant stenosis. Posterior circulation: Both vertebral arteries patent to the basilar. PICA patent bilaterally. Atherosclerotic irregularity and mild to moderate stenosis in the mid and distal basilar artery. Superior cerebellar and posterior cerebral arteries patent bilaterally. Mild  atherosclerotic disease in the posterior cerebral arteries bilaterally. Venous sinuses: Patent Anatomic variants: None Review of the MIP images confirms the above findings IMPRESSION: 1. Subtotal occlusion right M1 segment with irregularity most consistent with thrombus. Good distal flow. Associated hemorrhagic infarct right MCA territory noted on earlier CT head. Moderate atherosclerotic stenosis right cavernous carotid. 35-40% diameter stenosis right internal carotid artery stenosis at the origin. 2. Left carotid widely patent with mild atherosclerotic disease at the bifurcation. Both vertebral arteries widely patent. 3. Mild moderate basilar stenosis. Mild atherosclerotic disease posterior cerebral artery bilaterally. 4. These results were called by telephone at the time of interpretation on 11/10/2018 at 6:45 pm to provider ERIC [facility] , who verbally acknowledged these results. 5. Electronically Signed   By: Franchot Gallo M.D.   On: 11/10/2018 18:56   Ct Head Wo Contrast  Result Date: 11/11/2018 CLINICAL DATA:  Intracranial hemorrhage, known, follow-up. Additional history provided: Patient reports frontal headache today. EXAM: CT HEAD WITHOUT CONTRAST TECHNIQUE: Contiguous axial images were obtained from the base of the skull through the vertex without intravenous contrast. COMPARISON:  CT angiogram head/neck 11/10/2018, noncontrast head CT 11/10/2018 FINDINGS: Brain: Again demonstrated is a large region of cortical/subcortical edema within the right frontal lobe with additional involvement of the right insula and extending to the right basal ganglia. Hemorrhage centered within the right caudate head and extending inferiorly into the right basal ganglia has not significantly changed in size or appearance, measuring 2.0 x 3.4 cm (TV x CC) on the current examination (previously 2 x 3.3 cm). Associated mass effect with mild right to left subfalcine herniation, partial effacement of the right lateral ventricle  frontal horn and 2 mm leftward midline shift at the level of the septum pellucidum, similar to prior examination. Also similar to prior examination there is small volume intraventricular hemorrhage within the atrium of the right lateral ventricle. Vascular: Persistent hyperdensity within the M1 right middle cerebral artery. Subtotal occlusion of the M1 right MCA was noted on CT angiogram head 11/10/2018. Atherosclerotic calcification of the carotid artery siphons. Skull: No calvarial fracture. Sinuses/Orbits: Mild scattered paranasal sinus mucosal thickening. No significant mastoid effusion Visualized orbits demonstrate no acute abnormality. IMPRESSION: 1. No significant interval change in large subacute right MCA/ACA vascular territory hemorrhagic infarction as compared to head CT 11/10/2018. An area of hemorrhage centered within the right caudate head and extending inferiorly within the right basal ganglia measures 2 x 3.4 cm (previously 2 x 3.3). Small volume hemorrhage extending within the atrium of the right lateral ventricle has not significantly changed. 2. Mass effect with partial effacement of the right lateral ventricle frontal horn, right to left subfalcine herniation and 2 mm leftward midline shift,  also similar to prior exam. Electronically Signed   By: Kellie Simmering   On: 11/11/2018 09:38   Ct Angio Neck W Or Wo Contrast  Result Date: 11/10/2018 CLINICAL DATA:  Stroke with left-sided weakness and slurred speech EXAM: CT ANGIOGRAPHY HEAD AND NECK TECHNIQUE: Multidetector CT imaging of the head and neck was performed using the standard protocol during bolus administration of intravenous contrast. Multiplanar CT image reconstructions and MIPs were obtained to evaluate the vascular anatomy. Carotid stenosis measurements (when applicable) are obtained utilizing NASCET criteria, using the distal internal carotid diameter as the denominator. CONTRAST:  38mL OMNIPAQUE IOHEXOL 350 MG/ML SOLN COMPARISON:  CT  head 11/10/2018 FINDINGS: CTA NECK FINDINGS Aortic arch: Standard branching. Imaged portion shows no evidence of aneurysm or dissection. No significant stenosis of the major arch vessel origins. Right carotid system: Calcified and noncalcified plaque right carotid bulb narrowing the lumen by approximately 35-40% diameter stenosis. Left carotid system: Mild atherosclerotic disease left carotid bifurcation without significant stenosis. Vertebral arteries: Both vertebral arteries patent to the basilar without significant stenosis. Skeleton: Cervical spondylosis without acute abnormality. Other neck: Negative Upper chest: Negative Review of the MIP images confirms the above findings CTA HEAD FINDINGS Anterior circulation: Heavily calcified right cavernous carotid with moderate stenosis. Irregular subtotal occlusion of the right M1 segment with good distal flow. The area of stenosis has irregularity most compatible with thrombus. Left middle cerebral artery widely patent. Anterior cerebral arteries patent bilaterally without significant stenosis. Posterior circulation: Both vertebral arteries patent to the basilar. PICA patent bilaterally. Atherosclerotic irregularity and mild to moderate stenosis in the mid and distal basilar artery. Superior cerebellar and posterior cerebral arteries patent bilaterally. Mild atherosclerotic disease in the posterior cerebral arteries bilaterally. Venous sinuses: Patent Anatomic variants: None Review of the MIP images confirms the above findings IMPRESSION: 1. Subtotal occlusion right M1 segment with irregularity most consistent with thrombus. Good distal flow. Associated hemorrhagic infarct right MCA territory noted on earlier CT head. Moderate atherosclerotic stenosis right cavernous carotid. 35-40% diameter stenosis right internal carotid artery stenosis at the origin. 2. Left carotid widely patent with mild atherosclerotic disease at the bifurcation. Both vertebral arteries widely  patent. 3. Mild moderate basilar stenosis. Mild atherosclerotic disease posterior cerebral artery bilaterally. 4. These results were called by telephone at the time of interpretation on 11/10/2018 at 6:45 pm to provider ERIC [facility] , who verbally acknowledged these results. 5. Electronically Signed   By: Franchot Gallo M.D.   On: 11/10/2018 18:56   Dg Chest Portable 1 View  Result Date: 11/10/2018 CLINICAL DATA:  Stroke EXAM: PORTABLE CHEST 1 VIEW COMPARISON:  10/29/2018 FINDINGS: Left AICD remains in place, unchanged. Cardiomegaly. No confluent opacities, effusions or edema. No acute bony abnormality. IMPRESSION: Cardiomegaly.  No active disease. Electronically Signed   By: Rolm Baptise M.D.   On: 11/10/2018 19:58   Ct Head Code Stroke Wo Contrast  Result Date: 11/10/2018 CLINICAL DATA:  Code stroke.  Left-sided weakness slurred speech EXAM: CT HEAD WITHOUT CONTRAST TECHNIQUE: Contiguous axial images were obtained from the base of the skull through the vertex without intravenous contrast. COMPARISON:  CT head 09/07/2016 FINDINGS: Brain: Large area of cortical edema in the right frontal lobe. There is also involvement of the insula and basal ganglia which show abnormal hypodensity. Hyperdensity in the right head of caudate and anterior basal ganglia compatible with recent hemorrhage. Mild subfalcine herniation of right frontal lobe due to edema. Ventricle size normal.  No other acute infarct or mass Vascular: Negative  for hyperdense vessel Skull: Negative Sinuses/Orbits: Mild mucosal edema paranasal sinuses. Negative orbit. Other: None ASPECTS ([place] Stroke Program Early CT Score) - Ganglionic level infarction (caudate, lentiform nuclei, internal capsule, insula, M1-M3 cortex): 1 - Supraganglionic infarction (M4-M6 cortex): 2 Total score (0-10 with 10 being normal): 3 IMPRESSION: 1. Large territory subacute hemorrhagic infarct right MCA territory. Area of hemorrhage measures approximately 2 x 3 cm centered  in the head of the caudate and putamen on the right. The blood likely is subacute. Local mass-effect and mild subfalcine herniation due to edema. 2. ASPECTS is 3 3. Emergent results communicated with Dr. Cheral Marker via text page. Electronically Signed   By: Franchot Gallo M.D.   On: 11/10/2018 18:13    PHYSICAL EXAM   Temp:  [97.4 F (36.3 C)-99.6 F (37.6 C)] 98.5 F (36.9 C) (09/23 1208) Pulse Rate:  [58-99] 99 (09/23 1208) Resp:  [16-22] 20 (09/23 1208) BP: (106-126)/(72-85) 126/78 (09/23 1208) SpO2:  [96 %-99 %] 98 % (09/23 1208)  General - Well nourished, well developed, in no apparent distress.  Ophthalmologic - fundi not visualized due to noncooperation.  Cardiovascular - irregularly irregular heart rate and rhythm.  Mental Status -  Level of arousal and orientation to time, place, and person were intact. Language including expression, naming, repetition, comprehension was assessed and found intact. Moderate dysarthria. Left sensory neglect  Cranial Nerves II - XII - II - Visual field intact OU. III, IV, VI - Extraocular movements intact. V - Facial sensation intact bilaterally. VII - left facial drrop. VIII - Hearing & vestibular intact bilaterally. X - Palate elevates symmetrically. XI - Chin turning & shoulder shrug intact bilaterally. XII - Tongue protrusion intact.  Motor Strength - The patient's strength was normal in right UE and LE extremities, however, LUE 3/5 deltoid, 3+/5 bicep, 4/5 tricep and 2/5 hand grip. LLE proximal 3+/5, knee extension 4/5 and toe DF 5/5.  Bulk was normal and fasciculations were absent.    Motor Tone - Muscle tone was assessed at the neck and appendages and was normal.  Reflexes - The patient's reflexes were symmetrical in all extremities and he had no pathological reflexes.  Sensory - Light touch, temperature/pinprick were assessed and were subjectively symmetrical.  However, pt does have sensory neglect on the left  Coordination - The  patient had normal movements in the right hand with no ataxia or dysmetria.  Tremor was absent.  Gait and Station - deferred.   ASSESSMENT/PLAN Glenn [name] is a 68 y.o. male with history of AF on Eliquis, CHF, hepatitis C, HTN, CM w/ low EF, ICD placement and prior stroke just discharged from hospital 4 days prior for AF w/ RVR s/p cardioversion, volume overload and AKI, now presenting with L sided weakness and neglect..   Stroke:  Large R MCA infarct with hemorrhagic conversion, embolic secondary to known AF on Eliquis s/p reversal w/ Kcentra.   Code Stroke CT head large subacute hemorrhagic infarct R MCA (caudate head, putamen). Local mass effect ad mild subfalcine herniation.  ASPECTS 3.    CTA head & neck subtotal occlusion R M1 c/w thrombus w/ hemorrhagic infarct R MCA. R ICA 35-40% at origin. Mild L ICA atherosclerosis. Mild to mod BA stenosis and B PCAs.  CT head no significant change in infarct. Hemorrhage in R caudate head into R basal ganglia similar. Small R lateral ventricle hemorrhage stable. Mass effect w/ partial effacement R lateral frontal horn, R to L subfalcine herniation and 2 mm  midline shift similar.   2D Echo 10/30/18 EF 15-20%. AFib. AV regurg.  LDL 96  HgbA1c 4.9  SCDs for VTE prophylaxis  Eliquis (apixaban) daily prior to admission, now on No antithrombotic. Future AC regimen once hemorrhagic conversion resolves will be discussed with Dr. Cathie Olden.   Therapy recommendations:  CIR  Disposition:  pending   Medically ready for d/c when bed available  Cerebral Edema  Started on hypertonic saline protocol via PIV  Na 140-141  3% saline discontinued given mild MLS and severe CHF  On NS now  Acute on chronic Congestive heart failure Nonischemic cardiomyopathy EF 15-20% s/p ICD  Recent hospitalizaiton  on Entresto bid,  tikosyn 250 bid, spironolactone 12.5 daily, lasix, and coreg  Cardiology consult, appreciate help  Continue home  meds  Tikosyn discontinued   BP montioring  Atrial Fibrillation  Recent cardioversion w/ resultant admission   Home anticoagulation:  Eliquis (apixaban) daily   Cardiology on board  Hold off anticoagulation at this time due to hemorrhage  Future AC regimen once hemorrhagic conversion resolves will be discussed with Dr. Cathie Olden.  Hx stroke/TIA  08/2016 - L MCA s/p IR for L M2 occlusion with taking 3 reperfusion.  EF 30 to 35%.  Repeat CT no acute abnormality.  Did not do MRI due to ICD.  LDL 44, A1c 4.7.  His Xarelto changed to Eliquis and aspirin.  Hypertension  Home meds:  Coreg 25 bid, Lasix, spironolactone, Entresto  Off cleviprex  Prescribed dofetilide but not compliant at home, ? Restart. Seen by cardiology moonlighter. Will have cardiology to address   Home meds resumed . SBP goal <160   Stable  Close monitoring  Hyperlipidemia  Home meds:  No statin  LDL 96, goal < 70  Put on Lipitor 20  Continue statin on discharge  Dysphagia . Secondary to stroke . Speech on board . Cleared for Dysphagia 2 thin liquids . Continue IV fluid at 40 cc for now  Tobacco abuse  Current smoker  Smoking cessation counseling provided  Pt is willing to quit   Other Stroke Risk Factors  Advanced age  ETOH use, advised to drink no more than 2 drink(s) a day  Substance abuse - hx THC and cocaine use - has not used cocaine in years but smokes marijuana daily.   Family hx stroke (maternal aunt)  Other Active Problems  Chronic Hep C s/p treatment  AKI, Cre 1.54-1.4-1.23-1.33  Hospital day # 2   Rosalin Hawking, MD PhD Stroke Neurology 11/12/2018 2:19 PM   To contact Stroke Continuity provider, please refer to [email or web address] After hours, contact General Neurology

## 2018-11-12 NOTE — Plan of Care (Signed)
  Problem: Nutrition: Goal: Risk of aspiration will decrease Outcome: Progressing   

## 2018-11-12 NOTE — Progress Notes (Signed)
Modified Barium Swallow Progress Note  Patient Details  Name: Glenn [name] MRN: [id number] Date of Birth: [birth year]  Today's Date: 11/12/2018  Modified Barium Swallow completed.  Full report located under Chart Review in the Imaging Section.  Brief recommendations include the following:  Clinical Impression  MBS revealed mild-moderate oropharyngeal dysphagia characterized by anterior spillage 2/2 L weakness and reduced epiglottic inversion that resulted in flash penetration with no instances of aspiration. Although epiglottis inversion is absent or reduced, there was enough constriction via pharyngeal peristalsis to prevent aspiration across all consistencies. Thin liquids were swallowed quickly with appropriate timing compared to nectar thick which resulted in a delayed, uncoordinated swallow and greater pharyngeal residue. Mild-mod prolonged mastication and transit with regular solids and significant barium in left lateral sulci. He was receptive to chewing on his stronger R side and scanning for pocketing on his weak side. Pt was unable to swallow pill given thins or puree and expectorated. Recommend Dysphagia 2 diet with thin liquids, straws allowed, meds crushed in puree and cueing for compensatory strategies of R side chewing and lingual sweeping for L pocketing.        Swallow Evaluation Recommendations: Dys 2 (minced), thin, check left oral cavity for pocketing, sit upright, straws allowed,                                         Dystany Duffy, [name] 11/12/2018,4:18 PM

## 2018-11-12 NOTE — Progress Notes (Signed)
Inpatient Rehabilitation Admissions Coordinator  Inpatient rehab consult received. I met with patient at bedside for rehab assessment. Pt states he wants his son, [name] to be contacted for all discussions. I have left [name] a message to contact me about his Dad's rehab needs. I will begin insurance authorization with [facility] for a possible inpt rehab admit pending insurance approval and son's clarification of caregiver support at d/c from rehab. I will follow up.  Danne Baxter, RN, MSN Rehab Admissions Coordinator [phone] 11/12/2018 11:49 AM

## 2018-11-12 NOTE — PMR Pre-admission (Signed)
PMR Admission Coordinator Pre-Admission Assessment  Patient: Glenn [name] is an 68 y.o., male MRN: [id number] DOB: [birth year] Height:   Weight:    Insurance Information HMO: yes    PPO:      PCP:      IPA:      80/20:      OTHER: medicare advantage plan PRIMARY: [employer]    Policy#: [id number]      Subscriber: pt CM Name: Glenn [name]      Phone#: [phone] ext [phone]     Fax#: [phone] Pre-Cert#: [id number] approved for 7 days f/u [facility] phone ext   [phone]    Employer:  Benefits:  Phone #: [phone]     Name: 9/24 Eff. Date: 06/20/2018     Deduct: none      Out of Pocket Max: [phone]      Life Max: none CIR: because pt has medicaid it is covered at 100%      SNF: no co pay days 1 until 20; $178 co pay per day days 21 until 100 Outpatient: no co pay     Co-Pay: visits per medical neccesity Home Health: 100%      Co-Pay: visits per medical neccesity DME: 805     Co-Pay: 205 Providers: in network  SECONDARY: Medicaid Glenn [name]      Policy#: [id number]      Subscriber: pt Glenn [name] Medicaid Application Date:       Case Manager:  Disability Application Date:       Case Worker:   The "Data Collection Information Summary" for patients in Inpatient Rehabilitation Facilities with attached "Privacy Act Encinal Records" was provided and verbally reviewed with: Patient and Family  Emergency Contact Information Contact Information    Name Relation Home Work Mobile   [name],Glenn Son [place], [place]   [phone]      Current Medical History  Patient Admitting Diagnosis:  CVA  History of Present Illness: 68 year old right-handed male with history of atrial fibrillation maintained on Eliquis status post ICD as well as recent cardioversion 07/25/2945, diastolic congestive heart failure with ejection fraction of 15 to 20%, hepatitis C, hypertension  and prior left MCA infarction 2018 status post thrombectomy for left M2 occlusion, tobacco abuse  as well as remote history of cocaine and medical noncompliance.  Presented 11/10/2018 with acute left side weakness as well as facial droop.  Noted BUN 1.54, COVID negative.  Cranial CT scan showed large territory subacute hemorrhagic infarct right MCA territory.  Area of hemorrhage measuring approximate 2 x 3 cm centered in the head of the caudate and putamen on the right.  Patient did not receive TPA.  CT angiogram of head and neck showed subtotal occlusion right M1 segment with irregularity most consistent with thrombus.  Patient's Eliquis was reversed with Kcentra.  Recent echocardiogram with ejection fraction 15 to 20% and severely reduced systolic function.  Neurology follow-up initially maintained on hypertonic saline later discontinued.  Follow-up cardiology services for nonischemic cardiomyopathy as well as acute on chronic congestive heart failure and currently maintained on Coreg, Lasix, Aldactone as well as Entresto.  Patient currently remains off anticoagulation however subcutaneous heparin was initiated for DVT prophylaxis 11/11/2018.  Dysphasia #2 thin liquid diet.    Complete NIHSS TOTAL: 9  Patient's medical record from [facility] has been reviewed by the rehabilitation admission coordinator and physician.  Past Medical History  Past Medical History:  Diagnosis Date  . Atrial fibrillation ([place])   .  CHF (congestive heart failure) ([place])   . Hepatitis C   . Hypertension   . Stroke ([place])   . Visit for monitoring Tikosyn therapy 03/26/2017    Family History   family history includes High blood pressure in his father and mother; Stroke in his maternal aunt.  Prior Rehab/Hospitalizations Has the patient had prior rehab or hospitalizations prior to admission? Yes  Has the patient had major surgery during 100 days prior to admission? No   Current Medications  Current Facility-Administered Medications:  .  0.9 %  sodium chloride infusion, , Intravenous, Continuous, Rosalin Hawking, MD, Last Rate: 40 mL/hr at 11/13/18 0402 .  acetaminophen (TYLENOL) tablet 650 mg, 650 mg, Oral, Q4H PRN, 650 mg at 11/13/18 0924 **OR** acetaminophen (TYLENOL) solution 650 mg, 650 mg, Per Tube, Q4H PRN **OR** acetaminophen (TYLENOL) suppository 650 mg, 650 mg, Rectal, Q4H PRN, Kerney Elbe, MD, 650 mg at 11/11/18 0636 .  atorvastatin (LIPITOR) tablet 20 mg, 20 mg, Oral, q1800, Rosalin Hawking, MD, 20 mg at 11/12/18 1737 .  carvedilol (COREG) tablet 25 mg, 25 mg, Oral, BID WC, Kerney Elbe, MD, 25 mg at 11/13/18 5462 .  chlorhexidine (PERIDEX) 0.12 % solution 15 mL, 15 mL, Mouth Rinse, BID, Greta Doom, MD, 15 mL at 11/13/18 1023 .  diazepam (VALIUM) tablet 5 mg, 5 mg, Oral, BID PRN, Kerney Elbe, MD, 5 mg at 11/11/18 1713 .  furosemide (LASIX) tablet 40 mg, 40 mg, Oral, Daily, Kerney Elbe, MD, 40 mg at 11/13/18 1022 .  gabapentin (NEURONTIN) capsule 300 mg, 300 mg, Oral, TID, Kerney Elbe, MD, 300 mg at 11/13/18 1022 .  heparin injection 5,000 Units, 5,000 Units, Subcutaneous, Q8H, Rosalin Hawking, MD, 5,000 Units at 11/13/18 [phone] .  labetalol (NORMODYNE) injection 5-20 mg, 5-20 mg, Intravenous, Q2H PRN, Rosalin Hawking, MD .  MEDLINE mouth rinse, 15 mL, Mouth Rinse, q12n4p, Greta Doom, MD, 15 mL at 11/12/18 1742 .  metoprolol tartrate (LOPRESSOR) injection 2.5 mg, 2.5 mg, Intravenous, Q1H PRN, Greta Doom, MD, 2.5 mg at 11/10/18 2358 .  potassium chloride SA (K-DUR) CR tablet 20 mEq, 20 mEq, Oral, Daily, Kerney Elbe, MD, 20 mEq at 11/13/18 1022 .  sacubitril-valsartan (ENTRESTO) 24-26 mg per tablet, 1 tablet, Oral, BID, Kerney Elbe, MD, 1 tablet at 11/13/18 1023 .  senna-docusate (Senokot-S) tablet 1 tablet, 1 tablet, Oral, BID, Kerney Elbe, MD, 1 tablet at 11/12/18 1002 .  sodium chloride flush (NS) 0.9 % injection 3 mL, 3 mL, Intravenous, Once, Delo, Douglas, MD .  spironolactone (ALDACTONE) tablet 12.5 mg, 12.5 mg, Oral, Daily, Kerney Elbe, MD, 12.5  mg at 11/13/18 1023 .  traMADol (ULTRAM) tablet 50 mg, 50 mg, Oral, Q8H PRN, Rosalin Hawking, MD  Patients Current Diet:  Diet Order            DIET DYS 2 Room service appropriate? No; Fluid consistency: Thin  Diet effective now              Precautions / Restrictions Precautions Precautions: Fall Precaution Comments: Poor awareness, L inattention Restrictions Weight Bearing Restrictions: No   Has the patient had 2 or more falls or a fall with injury in the past year? No  Prior Activity Level Community (5-7x/wk): Independent and driving  Prior Functional Level Self Care: Did the patient need help bathing, dressing, using the toilet or eating? Independent  Indoor Mobility: Did the patient need assistance with walking from room to room (with or without device)? Independent  Stairs: Did the  patient need assistance with internal or external stairs (with or without device)? Independent  Functional Cognition: Did the patient need help planning regular tasks such as shopping or remembering to take medications? Independent  Home Assistive Devices / Equipment Home Equipment: None  Prior Device Use: Indicate devices/aids used by the patient prior to current illness, exacerbation or injury? None of the above  Current Functional Level Cognition  Arousal/Alertness: Awake/alert Overall Cognitive Status: Impaired/Different from baseline Current Attention Level: Sustained Orientation Level: Oriented X4 Following Commands: Follows one step commands inconsistently, Follows one step commands with increased time Safety/Judgement: Decreased awareness of safety, Decreased awareness of deficits General Comments: Pt requiring increased cues and time throughout session. Pt with poor awareness of deficits. Pt with poor attention of left side (body and environment) Attention: Sustained Sustained Attention: Appears intact Memory: (TBA further suspect deficits) Awareness: Impaired Awareness  Impairment: Emergent impairment, Anticipatory impairment Problem Solving: Impaired Problem Solving Impairment: Functional complex Safety/Judgment: Impaired    Extremity Assessment (includes Sensation/Coordination)  Upper Extremity Assessment: Defer to OT evaluation LUE Deficits / Details: Decreased strength and coorindation. Poor grasp as seen during MMT, ADLs, and mobility. Pt unable to perform finger opposition. Pt able to lift arm off the bed.  LUE Coordination: decreased fine motor, decreased gross motor  Lower Extremity Assessment: LLE deficits/detail LLE Deficits / Details: AROM WFL, but noted hip adductor, IR weakness with leg out to side with hip flexion in supine, knee extension 3/5, ankle DF 3+/5 LLE Sensation: (reports intact to light touch, but needs more formal assessment)    ADLs  Overall ADL's : Needs assistance/impaired Eating/Feeding: NPO Grooming: Minimal assistance, Sitting Upper Body Bathing: Minimal assistance, Sitting Lower Body Bathing: Maximal assistance, +2 for safety/equipment, Sit to/from stand, +2 for physical assistance Upper Body Dressing : Moderate assistance, Sitting Lower Body Dressing: Maximal assistance, +2 for safety/equipment, +2 for physical assistance, Sit to/from stand Toilet Transfer: Ambulation, RW, Minimal assistance, +2 for safety/equipment(Simulated to recliner) Toilet Transfer Details (indicate cue type and reason): Pt performing sit>stand with Min guard A for safety and assistance to place LUE onto RW. Pt requiring Min A for safe descent to recliner Functional mobility during ADLs: Maximal assistance, +2 for physical assistance, +2 for safety/equipment, Rolling walker, Moderate assistance General ADL Comments: Pt presenting with poor balance, strength, cognition, attention of left side, vision, and activity tolerance.    Mobility  Overal bed mobility: Needs Assistance Bed Mobility: Supine to Sit Supine to sit: Min assist General bed  mobility comments: reached for hand held assist to come up to sit and needed cues for righting trunk in sitting    Transfers  Overall transfer level: Needs assistance Equipment used: Rolling walker (2 wheeled) Transfers: Sit to/from Stand Sit to Stand: Min guard, Min assist, +2 safety/equipment General transfer comment: Pt performing sit>stand with Min guard A for safety and assistance to place LUE onto RW; pt pulling with RUE on RW. Pt requiring Min A for safe descent to recliner    Ambulation / Gait / Stairs / Wheelchair Mobility  Ambulation/Gait Ambulation/Gait assistance: Mod assist, +2 physical assistance Gait Distance (Feet): 65 Feet Assistive device: Rolling walker (2 wheeled) Gait Pattern/deviations: Step-to pattern, Decreased stride length, Decreased step length - left, Decreased dorsiflexion - left, Steppage, Shuffle General Gait Details: leaning L and with dragging L foot at times, over time with increased L lateral lean and needing max facilitation for R weight shift for balance and to clear L foot.  Patient felt as if I was contributing  to his LOB by assisting him to weight bear on R LE.    Posture / Balance Balance Overall balance assessment: Needs assistance Sitting-balance support: No upper extremity supported, Feet supported Sitting balance-Leahy Scale: Fair Standing balance support: Bilateral upper extremity supported, During functional activity Standing balance-Leahy Scale: Poor Standing balance comment: Reliant on physical A and UE support    Special needs/care consideration BiPAP/CPAP n/a CPM  N/a Continuous Drip IV  N/a Dialysis n/a Life Vest  N/a Oxygen  N/a Special Bed  N/a Trach Size  N/a Wound Vac n/a Skin intact Bowel mgmt: incontinent LBM 9/23 Bladder mgmt: external catheter Diabetic mgmt: Hgb A1c 4.9 Behavioral consideration  N/a Chemo/radiation n/a Designated visitor is [place] ICD   Previous Home Environment  Living Arrangements: Other  (Comment)(friend/room mate Jackelyn Poling)  Lives With: Friend(s) Available Help at Discharge: Family, Available 24 hours/day Type of Home: House Home Layout: One level Home [name]: Stairs to enter Technical brewer of Steps: 5 Bathroom Shower/Tub: Chiropodist: Standard Bathroom Accessibility: Yes How Accessible: Accessible via walker [place]: No  Discharge Living Setting Plans for Discharge Living Setting: Patient's home, Lives with (comment)(friend/room mate Debbie) Type of Home at Discharge: House Discharge Home Layout: One level Discharge Home [name]: Stairs to enter Entrance Stairs-Number of Steps: 5 Discharge Bathroom Shower/Tub: Tub/shower unit, Curtain Discharge Bathroom Toilet: Standard Discharge Bathroom Accessibility: Yes How Accessible: Accessible via walker Does the patient have any problems obtaining your medications?: No  Social/Family/Support Systems Patient Roles: Parent(one adult son, [name]) Contact Information: Glenn Anticipated Caregiver: friend/roommate Research scientist (medical) Information: see above Caregiver Availability: 24/7 Discharge Plan Discussed with Primary Caregiver: Yes Is Caregiver In Agreement with Plan?: Yes Does Caregiver/Family have Issues with Lodging/Transportation while Pt is in Rehab?: No  Goals/Additional Needs Patient/Family Goal for Rehab: supervision PT, OT and SLP Expected length of stay: [facility] 10 to 14 days Pt/Family Agrees to Admission and willing to participate: Yes Program Orientation Provided & Reviewed with Pt/Caregiver Including Roles  & Responsibilities: Yes  Decrease burden of Care through IP rehab admission: n/a  Possible need for SNF placement upon discharge: not anticipated  Patient Condition: I have reviewed medical records from [facility] , spoken with CM, and patient and son. I met with patient at the bedside for inpatient rehabilitation assessment.  Patient will  benefit from ongoing PT, OT and SLP, can actively participate in 3 hours of therapy a day 5 days of the week, and can make measurable gains during the admission.  Patient will also benefit from the coordinated team approach during an Inpatient Acute Rehabilitation admission.  The patient will receive intensive therapy as well as Rehabilitation physician, nursing, social worker, and care management interventions.  Due to bladder management, bowel management, safety, skin/wound care, disease management, medication administration, pain management and patient education the patient requires 24 hour a day rehabilitation nursing.  The patient is currently  Mod assist with mobility and basic ADLs.  Discharge setting and therapy post discharge at home with home health is anticipated.  Patient has agreed to participate in the Acute Inpatient Rehabilitation Program and will admit today.  Preadmission Screen Completed By:  Cleatrice Burke, 11/13/2018 1:15 PM ______________________________________________________________________   Discussed status with Dr. Posey Pronto on  11/13/2018 at  1315 and received approval for admission today.  Admission Coordinator:  Cleatrice Burke, RN, time  1096 Date  11/13/2018   Assessment/Plan: Diagnosis: right MCA hemorrhage  1. Does the need for close, 24 hr/day Medical  supervision in concert with the patient's rehab needs make it unreasonable for this patient to be served in a less intensive setting? Yes  2. Co-Morbidities requiring supervision/potential complications: atrial fibrillation maintained on Eliquis status post ICD as well as recent cardioversion 06/26/3072, diastolic congestive heart failure with ejection fraction of 15 to 20%, hepatitis C, hypertension  and prior left MCA infarction 2018 status post thrombectomy for left M2 occlusion, tobacco abuse as well as remote history of cocaine and medical noncompliance 3. Due to bladder management, bowel management, safety,  skin/wound care, disease management, medication administration and patient education, does the patient require 24 hr/day rehab nursing? Yes 4. Does the patient require coordinated care of a physician, rehab nurse, PT (1-2 hrs/day, 5 days/week), OT (1-2 hrs/day, 5 days/week) and SLP (1-2 hrs/day, 5 days/week) to address physical and functional deficits in the context of the above medical diagnosis(es)? Yes Addressing deficits in the following areas: balance, endurance, locomotion, strength, transferring, bathing, dressing, toileting, cognition, speech, language, swallowing and psychosocial support 5. Can the patient actively participate in an intensive therapy program of at least 3 hrs of therapy 5 days a week? Yes 6. The potential for patient to make measurable gains while on inpatient rehab is excellent 7. Anticipated functional outcomes upon discharge from inpatients are: supervision and min assist PT, supervision and min assist OT, supervision SLP 8. Estimated rehab length of stay to reach the above functional goals is: 17-21 days. 9. Anticipated D/C setting: Home 10. Anticipated post D/C treatments: HH therapy and Home excercise program 11. Overall Rehab/Functional Prognosis: good  MD Signature: Delice Lesch, MD, ABPMR

## 2018-11-12 NOTE — Consult Note (Signed)
   [facility] CM Inpatient Consult   11/12/2018  Glenn [name] 03-12-1950 [id number]   Patient screened for less than 7 days for unplanned readmission and to check if potential [place] Management services are needed.    Insurance: [employer] ACO  1400:  Review of patient's medical record reveals patient is patient admitted with CVA  Primary Care Provider is [name], MD  Pharmacy is: [facility] Follow up chart review reveals patient is a potential for in patient rehab.  Plan: Will follow disposition and needs.  Please place a [facility] Care Management consult as appropriate and for questions contact:   Natividad Brood, RN BSN [facility] Liaison  [phone] business mobile phone Toll free office [phone]  Fax number: [phone] [email or web address]

## 2018-11-12 NOTE — Plan of Care (Signed)
  Problem: Education: Goal: Knowledge of disease or condition will improve Outcome: Progressing Goal: Knowledge of secondary prevention will improve Outcome: Progressing Goal: Knowledge of patient specific risk factors addressed and post discharge goals established will improve Outcome: Progressing Goal: Individualized Educational Video(s) Outcome: Progressing   Problem: Coping: Goal: Will verbalize positive feelings about self Outcome: Progressing Goal: Will identify appropriate support needs Outcome: Progressing   Problem: Health Behavior/Discharge Planning: Goal: Ability to manage health-related needs will improve Outcome: Progressing   Problem: Self-Care: Goal: Ability to participate in self-care as condition permits will improve Outcome: Progressing Goal: Verbalization of feelings and concerns over difficulty with self-care will improve Outcome: Progressing Goal: Ability to communicate needs accurately will improve Outcome: Progressing   Problem: Nutrition: Goal: Risk of aspiration will decrease Outcome: Progressing Goal: Dietary intake will improve Outcome: Progressing   Problem: Intracerebral Hemorrhage Tissue Perfusion: Goal: Complications of Intracerebral Hemorrhage will be minimized Outcome: Progressing   Problem: Ischemic Stroke/TIA Tissue Perfusion: Goal: Complications of ischemic stroke/TIA will be minimized Outcome: Progressing   Ival Bible, BSN, RN

## 2018-11-12 NOTE — Progress Notes (Signed)
PT Cancellation Note  Patient Details Name: Glenn [name] MRN: [id number] DOB: 29-Jul-1950   Cancelled Treatment:    Reason Eval/Treat Not Completed: Patient at procedure or test/unavailable Patient off unit for MRI. PT will continue to follow acutely.    Earney Navy, PTA Acute Rehabilitation Services Pager: [phone] Office: [phone]   11/12/2018, 2:52 PM

## 2018-11-12 NOTE — Progress Notes (Addendum)
Progress Note  Patient Name: Glenn [name] Date of Encounter: 11/12/2018  Primary Cardiologist: Virl Axe, MD    Subjective   Patient is a 68 year old gentleman with a history of atrial fibrillation.  He has a history of an ICD and has been on Eliquis.  He has chronic systolic congestive heart failure with an ejection fraction of 15 to 20%.  He was admitted with an intracerebral bleed.  Eliquis has been discontinued.  In addition, Tikosyn has also been on hold.  He complains of having a headache.  He has no cardiac symptoms.  He still has severe neurologic deficits.  Eliquis has been on hold   Neuro has said that we would likely be able to restart his oral anticoagulation once the hemorrhagic conversion resolves.   Inpatient Medications    Scheduled Meds:  atorvastatin  20 mg Oral q1800   carvedilol  25 mg Oral BID WC   chlorhexidine  15 mL Mouth Rinse BID   furosemide  40 mg Oral Daily   gabapentin  300 mg Oral TID   heparin injection (subcutaneous)  5,000 Units Subcutaneous Q8H   mouth rinse  15 mL Mouth Rinse q12n4p   potassium chloride SA  20 mEq Oral Daily   sacubitril-valsartan  1 tablet Oral BID   senna-docusate  1 tablet Oral BID   sodium chloride flush  3 mL Intravenous Once   spironolactone  12.5 mg Oral Daily   Continuous Infusions:  sodium chloride 40 mL/hr at 11/11/18 1526   PRN Meds: acetaminophen **OR** acetaminophen (TYLENOL) oral liquid 160 mg/5 mL **OR** acetaminophen, diazepam, labetalol, metoprolol tartrate   Vital Signs    Vitals:   11/12/18 0339 11/12/18 0506 11/12/18 0740 11/12/18 1208  BP: 123/85  118/82 126/78  Pulse: 87  75 99  Resp: 17  20 20   Temp: 99.6 F (37.6 C) 98.6 F (37 C) (!) 97.4 F (36.3 C) 98.5 F (36.9 C)  TempSrc: Oral Oral Oral Oral  SpO2: 97%  98% 98%    Intake/Output Summary (Last 24 hours) at 11/12/2018 1711 Last data filed at 11/12/2018 [id number] Gross per 24 hour  Intake 800 ml  Output 880 ml    Net -80 ml   Last 3 Weights 11/06/2018 11/05/2018 11/04/2018  Weight (lbs) 195 lb 8 oz 197 lb 201 lb 12.8 oz  Weight (kg) 88.678 kg 89.359 kg 91.536 kg      Telemetry    Atrial fibrillation with a well-controlled ventricular response- Personally Reviewed  ECG    - Personally Reviewed  Physical Exam   GEN:  Chronically ill-appearing gentleman, no acute distress.  He is clearly had a stroke. Neck: No JVD Cardiac:  Irregularly irregular. Respiratory: Clear to auscultation bilaterally. GI: Soft, nontender, non-distended  MS: No edema; No deformity. Neuro:   Very weak on his left side.  He is only able only able to raise his hand slightly.  He can move his left foot slightly.  His speech is definitely altered. Psych: Normal affect   Labs    High Sensitivity Troponin:   Recent Labs  Lab 10/29/18 2355 10/30/18 0127 10/30/18 0339 10/30/18 1649 10/30/18 1826  TROPONINIHS 55* 58* 52* 31* 32*      Chemistry Recent Labs  Lab 11/10/18 1753 11/10/18 1801  11/11/18 0329 11/11/18 0546 11/12/18 0518  NA 137 137   < > 174* 140 141  K 4.7 4.5  --  3.8  --  4.5  CL 103 102  --  >  130*  --  110  CO2 21*  --   --  23  --  21*  GLUCOSE 126* 126*  --  110*  --  105*  BUN 15 17  --  12  --  15  CREATININE 1.54* 1.40*  --  1.23  --  1.33*  CALCIUM 10.0  --   --  7.6*  --  8.6*  PROT 8.2*  --   --   --   --   --   ALBUMIN 4.4  --   --   --   --   --   AST 26  --   --   --   --   --   ALT 21  --   --   --   --   --   ALKPHOS 94  --   --   --   --   --   BILITOT 0.9  --   --   --   --   --   GFRNONAA 46*  --   --  >60  --  55*  GFRAA 54*  --   --  >60  --  >60  ANIONGAP 13  --   --  NOT CALCULATED  --  10   < > = values in this interval not displayed.     Hematology Recent Labs  Lab 11/10/18 1753 11/10/18 1801 11/11/18 0329 11/12/18 0518  WBC 10.5  --  8.6 8.9  RBC 5.05  --  4.23 4.48  HGB 16.4 17.7* 13.7 14.7  HCT 47.6 52.0 40.4 44.0  MCV 94.3  --  95.5 98.2  MCH 32.5   --  32.4 32.8  MCHC 34.5  --  33.9 33.4  RDW 12.9  --  13.2 13.2  PLT 201  --  145* 121*    BNPNo results for input(s): BNP, PROBNP in the last 168 hours.   DDimer No results for input(s): DDIMER in the last 168 hours.   Radiology    Ct Angio Head W Or Wo Contrast  Result Date: 11/10/2018 CLINICAL DATA:  Stroke with left-sided weakness and slurred speech EXAM: CT ANGIOGRAPHY HEAD AND NECK TECHNIQUE: Multidetector CT imaging of the head and neck was performed using the standard protocol during bolus administration of intravenous contrast. Multiplanar CT image reconstructions and MIPs were obtained to evaluate the vascular anatomy. Carotid stenosis measurements (when applicable) are obtained utilizing NASCET criteria, using the distal internal carotid diameter as the denominator. CONTRAST:  58mL OMNIPAQUE IOHEXOL 350 MG/ML SOLN COMPARISON:  CT head 11/10/2018 FINDINGS: CTA NECK FINDINGS Aortic arch: Standard branching. Imaged portion shows no evidence of aneurysm or dissection. No significant stenosis of the major arch vessel origins. Right carotid system: Calcified and noncalcified plaque right carotid bulb narrowing the lumen by approximately 35-40% diameter stenosis. Left carotid system: Mild atherosclerotic disease left carotid bifurcation without significant stenosis. Vertebral arteries: Both vertebral arteries patent to the basilar without significant stenosis. Skeleton: Cervical spondylosis without acute abnormality. Other neck: Negative Upper chest: Negative Review of the MIP images confirms the above findings CTA HEAD FINDINGS Anterior circulation: Heavily calcified right cavernous carotid with moderate stenosis. Irregular subtotal occlusion of the right M1 segment with good distal flow. The area of stenosis has irregularity most compatible with thrombus. Left middle cerebral artery widely patent. Anterior cerebral arteries patent bilaterally without significant stenosis. Posterior circulation:  Both vertebral arteries patent to the basilar. PICA patent bilaterally. Atherosclerotic irregularity and mild to moderate  stenosis in the mid and distal basilar artery. Superior cerebellar and posterior cerebral arteries patent bilaterally. Mild atherosclerotic disease in the posterior cerebral arteries bilaterally. Venous sinuses: Patent Anatomic variants: None Review of the MIP images confirms the above findings IMPRESSION: 1. Subtotal occlusion right M1 segment with irregularity most consistent with thrombus. Good distal flow. Associated hemorrhagic infarct right MCA territory noted on earlier CT head. Moderate atherosclerotic stenosis right cavernous carotid. 35-40% diameter stenosis right internal carotid artery stenosis at the origin. 2. Left carotid widely patent with mild atherosclerotic disease at the bifurcation. Both vertebral arteries widely patent. 3. Mild moderate basilar stenosis. Mild atherosclerotic disease posterior cerebral artery bilaterally. 4. These results were called by telephone at the time of interpretation on 11/10/2018 at 6:45 pm to provider ERIC [facility] , who verbally acknowledged these results. 5. Electronically Signed   By: Franchot Gallo M.D.   On: 11/10/2018 18:56   Ct Head Wo Contrast  Result Date: 11/11/2018 CLINICAL DATA:  Intracranial hemorrhage, known, follow-up. Additional history provided: Patient reports frontal headache today. EXAM: CT HEAD WITHOUT CONTRAST TECHNIQUE: Contiguous axial images were obtained from the base of the skull through the vertex without intravenous contrast. COMPARISON:  CT angiogram head/neck 11/10/2018, noncontrast head CT 11/10/2018 FINDINGS: Brain: Again demonstrated is a large region of cortical/subcortical edema within the right frontal lobe with additional involvement of the right insula and extending to the right basal ganglia. Hemorrhage centered within the right caudate head and extending inferiorly into the right basal ganglia has not  significantly changed in size or appearance, measuring 2.0 x 3.4 cm (TV x CC) on the current examination (previously 2 x 3.3 cm). Associated mass effect with mild right to left subfalcine herniation, partial effacement of the right lateral ventricle frontal horn and 2 mm leftward midline shift at the level of the septum pellucidum, similar to prior examination. Also similar to prior examination there is small volume intraventricular hemorrhage within the atrium of the right lateral ventricle. Vascular: Persistent hyperdensity within the M1 right middle cerebral artery. Subtotal occlusion of the M1 right MCA was noted on CT angiogram head 11/10/2018. Atherosclerotic calcification of the carotid artery siphons. Skull: No calvarial fracture. Sinuses/Orbits: Mild scattered paranasal sinus mucosal thickening. No significant mastoid effusion Visualized orbits demonstrate no acute abnormality. IMPRESSION: 1. No significant interval change in large subacute right MCA/ACA vascular territory hemorrhagic infarction as compared to head CT 11/10/2018. An area of hemorrhage centered within the right caudate head and extending inferiorly within the right basal ganglia measures 2 x 3.4 cm (previously 2 x 3.3). Small volume hemorrhage extending within the atrium of the right lateral ventricle has not significantly changed. 2. Mass effect with partial effacement of the right lateral ventricle frontal horn, right to left subfalcine herniation and 2 mm leftward midline shift, also similar to prior exam. Electronically Signed   By: Kellie Simmering   On: 11/11/2018 09:38   Ct Angio Neck W Or Wo Contrast  Result Date: 11/10/2018 CLINICAL DATA:  Stroke with left-sided weakness and slurred speech EXAM: CT ANGIOGRAPHY HEAD AND NECK TECHNIQUE: Multidetector CT imaging of the head and neck was performed using the standard protocol during bolus administration of intravenous contrast. Multiplanar CT image reconstructions and MIPs were obtained  to evaluate the vascular anatomy. Carotid stenosis measurements (when applicable) are obtained utilizing NASCET criteria, using the distal internal carotid diameter as the denominator. CONTRAST:  37mL OMNIPAQUE IOHEXOL 350 MG/ML SOLN COMPARISON:  CT head 11/10/2018 FINDINGS: CTA NECK FINDINGS Aortic arch: Standard  branching. Imaged portion shows no evidence of aneurysm or dissection. No significant stenosis of the major arch vessel origins. Right carotid system: Calcified and noncalcified plaque right carotid bulb narrowing the lumen by approximately 35-40% diameter stenosis. Left carotid system: Mild atherosclerotic disease left carotid bifurcation without significant stenosis. Vertebral arteries: Both vertebral arteries patent to the basilar without significant stenosis. Skeleton: Cervical spondylosis without acute abnormality. Other neck: Negative Upper chest: Negative Review of the MIP images confirms the above findings CTA HEAD FINDINGS Anterior circulation: Heavily calcified right cavernous carotid with moderate stenosis. Irregular subtotal occlusion of the right M1 segment with good distal flow. The area of stenosis has irregularity most compatible with thrombus. Left middle cerebral artery widely patent. Anterior cerebral arteries patent bilaterally without significant stenosis. Posterior circulation: Both vertebral arteries patent to the basilar. PICA patent bilaterally. Atherosclerotic irregularity and mild to moderate stenosis in the mid and distal basilar artery. Superior cerebellar and posterior cerebral arteries patent bilaterally. Mild atherosclerotic disease in the posterior cerebral arteries bilaterally. Venous sinuses: Patent Anatomic variants: None Review of the MIP images confirms the above findings IMPRESSION: 1. Subtotal occlusion right M1 segment with irregularity most consistent with thrombus. Good distal flow. Associated hemorrhagic infarct right MCA territory noted on earlier CT head.  Moderate atherosclerotic stenosis right cavernous carotid. 35-40% diameter stenosis right internal carotid artery stenosis at the origin. 2. Left carotid widely patent with mild atherosclerotic disease at the bifurcation. Both vertebral arteries widely patent. 3. Mild moderate basilar stenosis. Mild atherosclerotic disease posterior cerebral artery bilaterally. 4. These results were called by telephone at the time of interpretation on 11/10/2018 at 6:45 pm to provider ERIC [facility] , who verbally acknowledged these results. 5. Electronically Signed   By: Franchot Gallo M.D.   On: 11/10/2018 18:56   [name] Contrast  Result Date: 11/12/2018 CLINICAL DATA:  Follow-up stroke.  Intracranial hemorrhage. EXAM: MRI HEAD WITHOUT CONTRAST TECHNIQUE: Multiplanar, multiecho pulse sequences of the brain and surrounding structures were obtained without intravenous contrast. COMPARISON:  Head CT 11/11/2018 and 11/10/2018 FINDINGS: Brain: Diffusion imaging redemonstrates acute infarction in the anterior portion of the right middle cerebral artery territory and possibly some of the anterior cerebral artery territory, with confluent involvement of the right anterior frontal lobe, frontal operculum region, insula, basal ganglia and frontoparietal junction region. Hemorrhage into the right basal ganglia appears similar, maximal dimension 2 cm. Layering intraventricular blood in the occipital horn of the right lateral ventricle as seen previously. No acute infarction in the left hemisphere or affecting the brainstem or cerebellum. Swelling in the region of infarction, with right-to-left shift measuring up to 8 mm. No ventricular trapping. No extra-axial collection. Vascular: Major vessels at the base of the brain show flow. Skull and upper cervical spine: Negative Sinuses/Orbits: Clear/normal Other: None IMPRESSION: No evidence of extension of the right brain infarction. Acute infarction affecting the right frontal lobe, frontal  operculum, insula and frontoparietal junction region, with involvement of the right basal ganglia. 2 cm hematoma in the right basal ganglia has not enlarged. Intraventricular blood has not increased. Swelling and mass effect, right-to-left shift measuring up to 8 mm, unchanged when measured in the same location on yesterday's CT. Electronically Signed   By: Nelson Chimes M.D.   On: 11/12/2018 16:12   Dg Chest Portable 1 View  Result Date: 11/10/2018 CLINICAL DATA:  Stroke EXAM: PORTABLE CHEST 1 VIEW COMPARISON:  10/29/2018 FINDINGS: Left AICD remains in place, unchanged. Cardiomegaly. No confluent opacities, effusions or edema. No acute  bony abnormality. IMPRESSION: Cardiomegaly.  No active disease. Electronically Signed   By: Rolm Baptise M.D.   On: 11/10/2018 19:58   Dg Swallowing Func-speech Pathology  Result Date: 11/12/2018 Objective Swallowing Evaluation: Type of Study: Bedside Swallow Evaluation  Patient Details Name: KEKAI LEMANSKI MRN: [id number] Date of Birth: [birth year] Today's Date: 11/12/2018 Time: SLP Start Time (ACUTE ONLY): 0900 -SLP Stop Time (ACUTE ONLY): [id number] SLP Time Calculation (min) (ACUTE ONLY): 24 min Past Medical History: Past Medical History: Diagnosis Date  Atrial fibrillation ([place])   CHF (congestive heart failure) ([place])   Hepatitis C   Hypertension   Stroke [facility])   Visit for monitoring Tikosyn therapy 03/26/2017 Past Surgical History: Past Surgical History: Procedure Laterality Date  CARDIAC DEFIBRILLATOR PLACEMENT  2015  CARDIOVERSION N/A 10/10/2016  Procedure: CARDIOVERSION;  Surgeon: Dorothy Spark, MD;  Location: [place];  Service: Cardiovascular;  Laterality: N/A;  CARDIOVERSION N/A 03/27/2017  Procedure: CARDIOVERSION;  Surgeon: Jerline Pain, MD;  Location: [place];  Service: Cardiovascular;  Laterality: N/A;  CARDIOVERSION N/A 10/29/2018  Procedure: CARDIOVERSION;  Surgeon: Sanda Klein, MD;  Location: [place];  Service: Cardiovascular;   Laterality: N/A;  CARDIOVERSION N/A 11/05/2018  Procedure: CARDIOVERSION;  Surgeon: Yarianna Varble, Wonda Cheng, MD;  Location: [place];  Service: Cardiovascular;  Laterality: N/A;  EYE SURGERY Left 1990  IR PERCUTANEOUS ART THROMBECTOMY/INFUSION INTRACRANIAL INC DIAG ANGIO  09/05/2016  IR RADIOLOGIST EVAL & MGMT  10/03/2016  RADIOLOGY WITH ANESTHESIA N/A 09/05/2016  Procedure: RADIOLOGY WITH ANESTHESIA;  Surgeon: Luanne Bras, MD;  Location: [place];  Service: Radiology;  Laterality: N/A;  RIGHT/LEFT HEART CATH AND CORONARY ANGIOGRAPHY N/A 11/03/2018  Procedure: RIGHT/LEFT HEART CATH AND CORONARY ANGIOGRAPHY;  Surgeon: Lorretta Harp, MD;  Location: [place] CV LAB;  Service: Cardiovascular;  Laterality: N/A; HPI: LINNIE MCCLAY is an 68 y.o. male with atrial fibrillation on Eliquis, CHF, hepatitis C, HTN, recent cardiac defibrillator placement and prior stroke admitted with L side weakness, slurred speech, and altered mental status. CT showed large subacute hermorrhagic infarct R MCA. CXR was unremarkable. BSE 09/07/16 revealed functional swallow and regular/thin liquids rec'd. Pt was given the [place] and RN reported coughing and choking, currently NPO.  Subjective: "want some water" Assessment / Plan / Recommendation CHL IP CLINICAL IMPRESSIONS 11/12/2018 Clinical Impression MBS revealed mild-moderate oropharyngeal dysphagia characterized by anterior spillage 2/2 L weakness and reduced epiglottic inversion that resulted in flash penetration with no instances of aspiration. Although epiglottis inversion is absent or reduced, there was enough constriction via pharyngeal peristalsis to prevent aspiration across all consistencies. Thin liquids were swallowed quickly with appropriate timing compared to nectar thick which resulted in a delayed, uncoordinated swallow and greater pharyngeal residue. Mild-mod prolonged mastication and transit with regular solids and significant barium in left lateral sulci. He was  receptive to chewing on his stronger R side and scanning for pocketing on his weak side. Pt was unable to swallow pill given thins or puree and expectorated. Recommend Dysphagia 2 diet with thin liquids, straws allowed, meds crushed in puree and cueing for compensatory strategies of R side chewing and lingual sweeping for L pocketing.    SLP Visit Diagnosis Dysphagia, oropharyngeal phase (R13.12) Attention and concentration deficit following -- Frontal lobe and executive function deficit following -- Impact on safety and function Mild aspiration risk   CHL IP TREATMENT RECOMMENDATION 11/12/2018 Treatment Recommendations Therapy as outlined in treatment plan below   Prognosis 11/12/2018 Prognosis for Safe Diet Advancement Good Barriers to Reach Goals --  Barriers/Prognosis Comment -- CHL IP DIET RECOMMENDATION 11/12/2018 SLP Diet Recommendations Dysphagia 2 (Fine chop) solids;Thin liquid Liquid Administration via Cup;Straw Medication Administration Crushed with puree Compensations Monitor for anterior loss;Lingual sweep for clearance of pocketing Postural Changes Seated upright at 90 degrees   CHL IP OTHER RECOMMENDATIONS 11/12/2018 Recommended Consults -- Oral Care Recommendations Oral care BID Other Recommendations --   CHL IP FOLLOW UP RECOMMENDATIONS 11/12/2018 Follow up Recommendations Inpatient Rehab   CHL IP FREQUENCY AND DURATION 11/12/2018 Speech Therapy Frequency (ACUTE ONLY) min 2x/week Treatment Duration 2 weeks      CHL IP ORAL PHASE 11/12/2018 Oral Phase Impaired Oral - Pudding Teaspoon -- Oral - Pudding Cup -- Oral - Honey Teaspoon -- Oral - Honey Cup -- Oral - Nectar Teaspoon -- Oral - Nectar Cup Left anterior bolus loss Oral - Nectar Straw -- Oral - Thin Teaspoon -- Oral - Thin Cup Left anterior bolus loss Oral - Thin Straw Left anterior bolus loss Oral - Puree -- Oral - Mech Soft -- Oral - Regular Left anterior bolus loss;Delayed oral transit;Weak lingual manipulation Oral - Multi-Consistency -- Oral - Pill  Weak lingual manipulation;Holding of bolus Oral Phase - Comment --  CHL IP PHARYNGEAL PHASE 11/12/2018 Pharyngeal Phase Impaired Pharyngeal- Pudding Teaspoon -- Pharyngeal -- Pharyngeal- Pudding Cup -- Pharyngeal -- Pharyngeal- Honey Teaspoon -- Pharyngeal -- Pharyngeal- Honey Cup -- Pharyngeal -- Pharyngeal- Nectar Teaspoon -- Pharyngeal -- Pharyngeal- Nectar Cup Pharyngeal residue - valleculae;Pharyngeal residue - pyriform;Reduced epiglottic inversion Pharyngeal -- Pharyngeal- Nectar Straw -- Pharyngeal -- Pharyngeal- Thin Teaspoon -- Pharyngeal -- Pharyngeal- Thin Cup Reduced epiglottic inversion;Penetration/Aspiration during swallow Pharyngeal Material enters airway, remains ABOVE vocal cords then ejected out Pharyngeal- Thin Straw Reduced epiglottic inversion Pharyngeal -- Pharyngeal- Puree -- Pharyngeal -- Pharyngeal- Mechanical Soft -- Pharyngeal -- Pharyngeal- Regular Reduced epiglottic inversion;Pharyngeal residue - pyriform;Pharyngeal residue - valleculae Pharyngeal -- Pharyngeal- Multi-consistency -- Pharyngeal -- Pharyngeal- Pill NT Pharyngeal -- Pharyngeal Comment --  CHL IP CERVICAL ESOPHAGEAL PHASE 11/12/2018 Cervical Esophageal Phase WFL Pudding Teaspoon -- Pudding Cup -- Honey Teaspoon -- Honey Cup -- Nectar Teaspoon -- Nectar Cup -- Nectar Straw -- Thin Teaspoon -- Thin Cup -- Thin Straw -- Puree -- Mechanical Soft -- Regular -- Multi-consistency -- Pill -- Cervical Esophageal Comment -- [place] Siren 11/12/2018, 4:15 PM [name] Litaker M.Ed Actor Pager [phone] Office [phone]              Ct Head Code Stroke Wo Contrast  Result Date: 11/10/2018 CLINICAL DATA:  Code stroke.  Left-sided weakness slurred speech EXAM: CT HEAD WITHOUT CONTRAST TECHNIQUE: Contiguous axial images were obtained from the base of the skull through the vertex without intravenous contrast. COMPARISON:  CT head 09/07/2016 FINDINGS: Brain: Large area of cortical edema in the right  frontal lobe. There is also involvement of the insula and basal ganglia which show abnormal hypodensity. Hyperdensity in the right head of caudate and anterior basal ganglia compatible with recent hemorrhage. Mild subfalcine herniation of right frontal lobe due to edema. Ventricle size normal.  No other acute infarct or mass Vascular: Negative for hyperdense vessel Skull: Negative Sinuses/Orbits: Mild mucosal edema paranasal sinuses. Negative orbit. Other: None ASPECTS ([place] Stroke Program Early CT Score) - Ganglionic level infarction (caudate, lentiform nuclei, internal capsule, insula, M1-M3 cortex): 1 - Supraganglionic infarction (M4-M6 cortex): 2 Total score (0-10 with 10 being normal): 3 IMPRESSION: 1. Large territory subacute hemorrhagic infarct right MCA territory. Area of hemorrhage measures approximately 2 x 3 cm centered  in the head of the caudate and putamen on the right. The blood likely is subacute. Local mass-effect and mild subfalcine herniation due to edema. 2. ASPECTS is 3 3. Emergent results communicated with Dr. Cheral Marker via text page. Electronically Signed   By: Franchot Gallo M.D.   On: 11/10/2018 18:13    Cardiac Studies     Patient Profile     68 y.o. male with history of atrial fibrillation.  He is now back in atrial fibrillation and has had a large intracerebral bleed.  Assessment & Plan    1.  Atrial fibrillation: At this point he is not able to restart anticoagulation because of his large intracerebral bleed.  We will also discontinue the Tikosyn as at this point I think that would be more risky to convert him to sinus rhythm.  He is at risk of having another stroke. His rate is well controlled.  We will continue current medications for rate control.  Eliquis has been on hold   Neuro has said that we would likely be able to restart his oral anticoagulation once the hemorrhagic conversion resolves.  I would recommend that we restart Eliquis once he is cleared by neuro.    After 3 weeks, we can potentially restart tikosyn in an attempt to establish sinus rhythm.   Will leave this up to Dr. [name]    2.  Chronic systolic congestive heart failure: His ejection fraction is 15 to 20%.  Unfortunately he is quite debilitated at this point.  Continue carvedilol, spironolactone, Entresto. He is at high risk of thromboembolus.   The plan is to restart Eliquis once he is cleared by neuro ( after resolution of the hemorrhagic transformation )   3.  Hyperlipidemia: Continue atorvastatin 20 mg a day  4.  Intracranial hemorrhage:  Plans per neuro.       For questions or updates, please contact [place] Please consult [email or web address] for contact info under        Signed, [name], MD  11/12/2018, 5:11 PM

## 2018-11-12 NOTE — H&P (Addendum)
Physical Medicine and Rehabilitation Admission H&P    Chief Complaint  Patient presents with   Code Stroke  : HPI: Glenn [name] is a 68 year old right-handed male with history of atrial fibrillation maintained on Eliquis status post ICD as well as recent cardioversion [id number], diastolic congestive heart failure with ejection fraction of 15 to 20%, hepatitis C, hypertension  and prior left MCA infarction 2018 status post thrombectomy for left M2 occlusion, tobacco abuse as well as remote history of cocaine and medical noncompliance.  History taken from chart review patient due to dysarthria and lethargy.  Patient lives with a roomate.  1 level home 5 steps to enter.  Reportedly independent working as a Dealer. He has a son in the area.  Presented 11/10/2018 with left hemiparesis and facial droop.  Noted BUN 1.54, COVID negative.  Cranial CT scan showed large right MCA subacute hemorrhage.  Area of hemorrhage measuring approximate 2 x 3 cm centered in the head of the caudate and putamen on the right.  Patient did not receive TPA.  CT angiogram of head and neck showed subtotal occlusion right M1 segment with irregularity most consistent with thrombus.  Patient's Eliquis was reversed with Kcentra.  Echocardiogram with ejection fraction 15-20% Neurology follow-up initially maintained on hypertonic saline later discontinued.  Follow-up cardiology services for nonischemic cardiomyopathy as well as acute on chronic congestive heart failure and currently maintained on Coreg, Lasix, Aldactone as well as Entresto.  Patient currently remains off anticoagulation however subcutaneous heparin was initiated for DVT prophylaxis 11/11/2018.  Patient on dysphagia to thin liquid diet.  Therapy evaluations completed and patient was admitted for a comprehensive rehab program  Review of Systems  Constitutional: Negative for fever.  HENT: Negative for hearing loss.   Eyes: Negative for blurred vision and double  vision.  Respiratory: Negative for cough and shortness of breath.   Gastrointestinal: Positive for constipation. Negative for heartburn, nausea and vomiting.  Genitourinary: Negative for dysuria, flank pain and hematuria.  Musculoskeletal: Positive for myalgias.  Skin: Negative for rash.  Neurological: Positive for speech change, focal weakness and weakness.  Psychiatric/Behavioral: The patient has insomnia.    Past Medical History:  Diagnosis Date   Atrial fibrillation [facility])    CHF (congestive heart failure) ([place])    Hepatitis C    Hypertension    Stroke [facility])    Visit for monitoring Tikosyn therapy 03/26/2017   Past Surgical History:  Procedure Laterality Date   CARDIAC DEFIBRILLATOR PLACEMENT  2015   CARDIOVERSION N/A 10/10/2016   Procedure: CARDIOVERSION;  Surgeon: Dorothy Spark, MD;  Location: [place];  Service: Cardiovascular;  Laterality: N/A;   CARDIOVERSION N/A 03/27/2017   Procedure: CARDIOVERSION;  Surgeon: Jerline Pain, MD;  Location: [place];  Service: Cardiovascular;  Laterality: N/A;   CARDIOVERSION N/A 10/29/2018   Procedure: CARDIOVERSION;  Surgeon: Sanda Klein, MD;  Location: [place];  Service: Cardiovascular;  Laterality: N/A;   CARDIOVERSION N/A 11/05/2018   Procedure: CARDIOVERSION;  Surgeon: Nahser, Wonda Cheng, MD;  Location: [place];  Service: Cardiovascular;  Laterality: N/A;   EYE SURGERY Left 1990   IR PERCUTANEOUS ART THROMBECTOMY/INFUSION INTRACRANIAL INC DIAG ANGIO  09/05/2016   IR RADIOLOGIST EVAL & MGMT  10/03/2016   RADIOLOGY WITH ANESTHESIA N/A 09/05/2016   Procedure: RADIOLOGY WITH ANESTHESIA;  Surgeon: Luanne Bras, MD;  Location: [place];  Service: Radiology;  Laterality: N/A;   RIGHT/LEFT HEART CATH AND CORONARY ANGIOGRAPHY N/A 11/03/2018   Procedure: RIGHT/LEFT HEART CATH AND  CORONARY ANGIOGRAPHY;  Surgeon: Lorretta Harp, MD;  Location: [place] [facility];  Service: Cardiovascular;  Laterality: N/A;    Family History  Problem Relation Age of Onset   High blood pressure Mother    High blood pressure Father    Stroke Maternal Aunt    Heart disease Neg Hx    Social History:  reports that he has been smoking cigarettes. He has been smoking about 0.50 packs per day. He has never used smokeless tobacco. He reports previous alcohol use of about 3.0 standard drinks of alcohol per week. He reports current drug use. Frequency: 2.00 times per week. Drug: Marijuana. Allergies:  Allergies  Allergen Reactions   Benadryl [Diphenhydramine] Palpitations   Medications Prior to Admission  Medication Sig Dispense Refill   albuterol (VENTOLIN HFA) 108 (90 Base) MCG/ACT inhaler Inhale 2 puffs into the lungs every 4 (four) hours as needed for shortness of breath.     apixaban (ELIQUIS) 5 MG TABS tablet Take 1 tablet (5 mg total) by mouth 2 (two) times daily. 60 tablet 0   aspirin EC 325 MG tablet Take 162.5 mg by mouth daily.     carvedilol (COREG) 25 MG tablet Take 1 tablet (25 mg total) by mouth 2 (two) times daily with a meal. 60 tablet 0   diazepam (VALIUM) 5 MG tablet TAKE 1/2 TABLET(2.5 MG) BY MOUTH EVERY 8 HOURS AS NEEDED FOR ANXIETY (Patient taking differently: Take 5 mg by mouth 2 (two) times daily as needed for anxiety. ) 45 tablet 0   dofetilide (TIKOSYN) 250 MCG capsule Take 1 capsule (250 mcg total) by mouth 2 (two) times daily. 60 capsule 0   furosemide (LASIX) 40 MG tablet Take 1 tablet (40 mg total) by mouth daily. 30 tablet 0   gabapentin (NEURONTIN) 300 MG capsule TAKE 1 CAPSULE(300 MG) BY MOUTH THREE TIMES DAILY (Patient taking differently: Take 300 mg by mouth 3 (three) times daily. ) 180 capsule 0   potassium chloride SA (K-DUR) 20 MEQ tablet Take 1 tablet (20 mEq total) by mouth daily. 30 tablet 0   sacubitril-valsartan (ENTRESTO) 24-26 MG Take 1 tablet by mouth 2 (two) times daily. 60 tablet 0   spironolactone (ALDACTONE) 25 MG tablet Take 0.5 tablets (12.5 mg total) by  mouth daily. 30 tablet 0   vitamin C (ASCORBIC ACID) 500 MG tablet Take 500 mg by mouth daily.     [DISCONTINUED] Cyanocobalamin (B-12) 500 MCG TABS Take 500 mcg by mouth daily as needed (energy). (Patient taking differently: Take 500 mcg by mouth daily. ) 30 tablet 2   Glecaprevir-Pibrentasvir (MAVYRET) 100-40 MG TABS Take 3 tablets by mouth daily with supper. (Patient not taking: Reported on 11/10/2018) 90 tablet 0    Drug Regimen Review Drug regimen was reviewed and remains appropriate with no significant issues identified  Home: Home Living Family/patient expects to be discharged to:: Private residence Living Arrangements: Other (Comment)(friend/room mate Education administrator) Available Help at Discharge: Family, Available 24 hours/day Type of Home: House Home Access: Stairs to enter [employer] of Steps: 5 Home Layout: One level Bathroom Shower/Tub: Chiropodist: Standard Bathroom Accessibility: Yes Home Equipment: None  Lives With: Friend(s)   Functional History: Prior Function Level of Independence: Independent Comments: Worked as a Insurance underwriter Status:  Mobility: Bed Mobility Overal bed mobility: Needs Assistance Bed Mobility: Supine to Sit Supine to sit: Min assist General bed mobility comments: reached for hand held assist to come up to sit and needed cues  for righting trunk in sitting Transfers Overall transfer level: Needs assistance Equipment used: Rolling walker (2 wheeled) Transfers: Sit to/from Stand Sit to Stand: Min guard, Min assist, +2 safety/equipment General transfer comment: Pt performing sit>stand with Min guard A for safety and assistance to place LUE onto RW; pt pulling with RUE on RW. Pt requiring Min A for safe descent to recliner Ambulation/Gait Ambulation/Gait assistance: Mod assist, +2 physical assistance Gait Distance (Feet): 65 Feet Assistive device: Rolling walker (2 wheeled) Gait Pattern/deviations: Step-to  pattern, Decreased stride length, Decreased step length - left, Decreased dorsiflexion - left, Steppage, Shuffle General Gait Details: leaning L and with dragging L foot at times, over time with increased L lateral lean and needing max facilitation for R weight shift for balance and to clear L foot.  Patient felt as if I was contributing to his LOB by assisting him to weight bear on R LE.    ADL: ADL Overall ADL's : Needs assistance/impaired Eating/Feeding: NPO Grooming: Minimal assistance, Sitting Upper Body Bathing: Minimal assistance, Sitting Lower Body Bathing: Maximal assistance, +2 for safety/equipment, Sit to/from stand, +2 for physical assistance Upper Body Dressing : Moderate assistance, Sitting Lower Body Dressing: Maximal assistance, +2 for safety/equipment, +2 for physical assistance, Sit to/from stand Toilet Transfer: Ambulation, RW, Minimal assistance, +2 for safety/equipment(Simulated to recliner) Toilet Transfer Details (indicate cue type and reason): Pt performing sit>stand with Min guard A for safety and assistance to place LUE onto RW. Pt requiring Min A for safe descent to recliner Functional mobility during ADLs: Maximal assistance, +2 for physical assistance, +2 for safety/equipment, Rolling walker, Moderate assistance General ADL Comments: Pt presenting with poor balance, strength, cognition, attention of left side, vision, and activity tolerance.  Cognition: Cognition Overall Cognitive Status: Impaired/Different from baseline Arousal/Alertness: Awake/alert Orientation Level: Oriented X4 Attention: Sustained Sustained Attention: Appears intact Memory: (TBA further suspect deficits) Awareness: Impaired Awareness Impairment: Emergent impairment, Anticipatory impairment Problem Solving: Impaired Problem Solving Impairment: Functional complex Safety/Judgment: Impaired Cognition Arousal/Alertness: Awake/alert Behavior During Therapy: WFL for tasks  assessed/performed Overall Cognitive Status: Impaired/Different from baseline Area of Impairment: Orientation, Attention, Memory, Following commands, Safety/judgement, Awareness, Problem solving Orientation Level: Disoriented to, Place, Time, Situation Current Attention Level: Sustained Memory: Decreased recall of precautions, Decreased short-term memory Following Commands: Follows one step commands inconsistently, Follows one step commands with increased time Safety/Judgement: Decreased awareness of safety, Decreased awareness of deficits Awareness: Intellectual Problem Solving: Slow processing, Difficulty sequencing, Requires verbal cues, Requires tactile cues General Comments: Pt requiring increased cues and time throughout session. Pt with poor awareness of deficits. Pt with poor attention of left side (body and environment)  Physical Exam: Blood pressure 111/61, pulse 61, temperature 98.2 F (36.8 C), temperature source Oral, resp. rate 17, SpO2 98 %. Physical Exam  Vitals reviewed. Constitutional: He appears well-developed and well-nourished.  HENT:  Head: Normocephalic.  Eyes: Right eye exhibits no discharge. Left eye exhibits no discharge.  ?Difficulty with left gaze Left scleral hemorrhage  Neck: No tracheal deviation present. No thyromegaly present.  Respiratory: Effort normal.  GI: He exhibits no distension.  Musculoskeletal:     Comments: No edema or tenderness in extremities  Neurological:  Lethargic Moderate to severe dysarthria  Follows simple commands. Right facial weakness Right upper extremity: 5/5 proximal distal Right lower extremity: 4-4+/5 proximal distal Left upper extremity: 1+-2-/5 proximal distal Left lower extremity: 3-/5 proximal to distal  Skin: Skin is warm and dry.  Psychiatric: His affect is blunt. His speech is delayed and slurred. He is  slowed.    Results for orders placed or performed during the hospital encounter of 11/10/18 (from the past  48 hour(s))  Glucose, capillary     Status: None   Collection Time: 11/11/18  8:54 PM  Result Value Ref Range   Glucose-Capillary 98 70 - 99 mg/dL   Comment 1 Notify RN   Glucose, capillary     Status: None   Collection Time: 11/11/18 11:54 PM  Result Value Ref Range   Glucose-Capillary 90 70 - 99 mg/dL  Glucose, capillary     Status: None   Collection Time: 11/12/18  3:37 AM  Result Value Ref Range   Glucose-Capillary 97 70 - 99 mg/dL  CBC     Status: Abnormal   Collection Time: 11/12/18  5:18 AM  Result Value Ref Range   WBC 8.9 4.0 - 10.5 K/uL   RBC 4.48 4.22 - 5.81 MIL/uL   Hemoglobin 14.7 13.0 - 17.0 g/dL   HCT 44.0 39.0 - 52.0 %   MCV 98.2 80.0 - 100.0 fL   MCH 32.8 26.0 - 34.0 pg   MCHC 33.4 30.0 - 36.0 g/dL   RDW 13.2 11.5 - 15.5 %   Platelets 121 (L) 150 - 400 K/uL    Comment: REPEATED TO VERIFY PLATELET COUNT CONFIRMED BY SMEAR Immature Platelet Fraction may be clinically indicated, consider ordering this additional test [id number]    nRBC 0.0 0.0 - 0.2 %    Comment: Performed at [facility], [place] [address], [place], [place] [id number]  Basic metabolic panel     Status: Abnormal   Collection Time: 11/12/18  5:18 AM  Result Value Ref Range   Sodium 141 135 - 145 mmol/L   Potassium 4.5 3.5 - 5.1 mmol/L   Chloride 110 98 - 111 mmol/L   CO2 21 (L) 22 - 32 mmol/L   Glucose, Bld 105 (H) 70 - 99 mg/dL   BUN 15 8 - 23 mg/dL   Creatinine, Ser 1.33 (H) 0.61 - 1.24 mg/dL   Calcium 8.6 (L) 8.9 - 10.3 mg/dL   GFR calc non Af Amer 55 (L) >60 mL/min   GFR calc Af Amer >60 >60 mL/min   Anion gap 10 5 - 15    Comment: Performed at [facility] [address] [place], [place] [address]  CBC     Status: Abnormal   Collection Time: 11/13/18  4:02 AM  Result Value Ref Range   WBC 8.8 4.0 - 10.5 K/uL   RBC 4.44 4.22 - 5.81 MIL/uL   Hemoglobin 14.4 13.0 - 17.0 g/dL   HCT 42.2 39.0 - 52.0 %   MCV 95.0 80.0 - 100.0 fL   MCH 32.4 26.0 - 34.0 pg   MCHC 34.1 30.0  - 36.0 g/dL   RDW 12.8 11.5 - 15.5 %   Platelets 132 (L) 150 - 400 K/uL    Comment: REPEATED TO VERIFY   nRBC 0.0 0.0 - 0.2 %    Comment: Performed at [name], [place] [address] [place], [place] [id number]  Basic metabolic panel     Status: Abnormal   Collection Time: 11/13/18  4:02 AM  Result Value Ref Range   Sodium 141 135 - 145 mmol/L   Potassium 4.1 3.5 - 5.1 mmol/L   Chloride 108 98 - 111 mmol/L   CO2 21 (L) 22 - 32 mmol/L   Glucose, Bld 102 (H) 70 - 99 mg/dL   BUN 13 8 -  23 mg/dL   Creatinine, Ser 1.38 (H) 0.61 - 1.24 mg/dL   Calcium 8.8 (L) 8.9 - 10.3 mg/dL   GFR calc non Af Amer 53 (L) >60 mL/min   GFR calc Af Amer >60 >60 mL/min   Anion gap 12 5 - 15    Comment: Performed at [facility] [address], [place], [place] [address]   [name] Contrast  Result Date: 11/12/2018 CLINICAL DATA:  Follow-up stroke.  Intracranial hemorrhage. EXAM: MRI HEAD WITHOUT CONTRAST TECHNIQUE: Multiplanar, multiecho pulse sequences of the brain and surrounding structures were obtained without intravenous contrast. COMPARISON:  Head CT 11/11/2018 and 11/10/2018 FINDINGS: Brain: Diffusion imaging redemonstrates acute infarction in the anterior portion of the right middle cerebral artery territory and possibly some of the anterior cerebral artery territory, with confluent involvement of the right anterior frontal lobe, frontal operculum region, insula, basal ganglia and frontoparietal junction region. Hemorrhage into the right basal ganglia appears similar, maximal dimension 2 cm. Layering intraventricular blood in the occipital horn of the right lateral ventricle as seen previously. No acute infarction in the left hemisphere or affecting the brainstem or cerebellum. Swelling in the region of infarction, with right-to-left shift measuring up to 8 mm. No ventricular trapping. No extra-axial collection. Vascular: Major vessels at the base of the brain show flow. Skull and upper cervical  spine: Negative Sinuses/Orbits: Clear/normal Other: None IMPRESSION: No evidence of extension of the right brain infarction. Acute infarction affecting the right frontal lobe, frontal operculum, insula and frontoparietal junction region, with involvement of the right basal ganglia. 2 cm hematoma in the right basal ganglia has not enlarged. Intraventricular blood has not increased. Swelling and mass effect, right-to-left shift measuring up to 8 mm, unchanged when measured in the same location on yesterday's CT. Electronically Signed   By: Nelson Chimes M.D.   On: 11/12/2018 16:12   Dg Swallowing Func-speech Pathology  Result Date: 11/12/2018 Objective Swallowing Evaluation: Type of Study: Bedside Swallow Evaluation  Patient Details Name: BRIDGET WOZNICK MRN: [id number] Date of Birth: November 21, 1950 Today's Date: 11/12/2018 Time: SLP Start Time (ACUTE ONLY): 0900 -SLP Stop Time (ACUTE ONLY): [id number] SLP Time Calculation (min) (ACUTE ONLY): 24 min Past Medical History: Past Medical History: Diagnosis Date  Atrial fibrillation ([place])   CHF (congestive heart failure) ([place])   Hepatitis C   Hypertension   Stroke [facility])   Visit for monitoring Tikosyn therapy 03/26/2017 Past Surgical History: Past Surgical History: Procedure Laterality Date  CARDIAC DEFIBRILLATOR PLACEMENT  2015  CARDIOVERSION N/A 10/10/2016  Procedure: CARDIOVERSION;  Surgeon: Dorothy Spark, MD;  Location: [place];  Service: Cardiovascular;  Laterality: N/A;  CARDIOVERSION N/A 03/27/2017  Procedure: CARDIOVERSION;  Surgeon: Jerline Pain, MD;  Location: [place];  Service: Cardiovascular;  Laterality: N/A;  CARDIOVERSION N/A 10/29/2018  Procedure: CARDIOVERSION;  Surgeon: Sanda Klein, MD;  Location: [place];  Service: Cardiovascular;  Laterality: N/A;  CARDIOVERSION N/A 11/05/2018  Procedure: CARDIOVERSION;  Surgeon: Nahser, Wonda Cheng, MD;  Location: [place];  Service: Cardiovascular;  Laterality: N/A;  EYE SURGERY Left 1990   IR PERCUTANEOUS ART THROMBECTOMY/INFUSION INTRACRANIAL INC DIAG ANGIO  09/05/2016  IR RADIOLOGIST EVAL & MGMT  10/03/2016  RADIOLOGY WITH ANESTHESIA N/A 09/05/2016  Procedure: RADIOLOGY WITH ANESTHESIA;  Surgeon: Luanne Bras, MD;  Location: [place];  Service: Radiology;  Laterality: N/A;  RIGHT/LEFT HEART CATH AND CORONARY ANGIOGRAPHY N/A 11/03/2018  Procedure: RIGHT/LEFT HEART CATH AND CORONARY ANGIOGRAPHY;  Surgeon: Lorretta Harp, MD;  Location: [place]  [facility];  Service: Cardiovascular;  Laterality: N/A; HPI: KERVEN STEINHARDT is an 68 y.o. male with atrial fibrillation on Eliquis, CHF, hepatitis C, HTN, recent cardiac defibrillator placement and prior stroke admitted with L side weakness, slurred speech, and altered mental status. CT showed large subacute hermorrhagic infarct R MCA. CXR was unremarkable. BSE 09/07/16 revealed functional swallow and regular/thin liquids rec'd. Pt was given the [place] and RN reported coughing and choking, currently NPO.  Subjective: "want some water" Assessment / Plan / Recommendation CHL IP CLINICAL IMPRESSIONS 11/12/2018 Clinical Impression MBS revealed mild-moderate oropharyngeal dysphagia characterized by anterior spillage 2/2 L weakness and reduced epiglottic inversion that resulted in flash penetration with no instances of aspiration. Although epiglottis inversion is absent or reduced, there was enough constriction via pharyngeal peristalsis to prevent aspiration across all consistencies. Thin liquids were swallowed quickly with appropriate timing compared to nectar thick which resulted in a delayed, uncoordinated swallow and greater pharyngeal residue. Mild-mod prolonged mastication and transit with regular solids and significant barium in left lateral sulci. He was receptive to chewing on his stronger R side and scanning for pocketing on his weak side. Pt was unable to swallow pill given thins or puree and expectorated. Recommend Dysphagia 2 diet with thin  liquids, straws allowed, meds crushed in puree and cueing for compensatory strategies of R side chewing and lingual sweeping for L pocketing.    SLP Visit Diagnosis Dysphagia, oropharyngeal phase (R13.12) Attention and concentration deficit following -- Frontal lobe and executive function deficit following -- Impact on safety and function Mild aspiration risk   CHL IP TREATMENT RECOMMENDATION 11/12/2018 Treatment Recommendations Therapy as outlined in treatment plan below   Prognosis 11/12/2018 Prognosis for Safe Diet Advancement Good Barriers to Reach Goals -- Barriers/Prognosis Comment -- CHL IP DIET RECOMMENDATION 11/12/2018 SLP Diet Recommendations Dysphagia 2 (Fine chop) solids;Thin liquid Liquid Administration via Cup;Straw Medication Administration Crushed with puree Compensations Monitor for anterior loss;Lingual sweep for clearance of pocketing Postural Changes Seated upright at 90 degrees   CHL IP OTHER RECOMMENDATIONS 11/12/2018 Recommended Consults -- Oral Care Recommendations Oral care BID Other Recommendations --   CHL IP FOLLOW UP RECOMMENDATIONS 11/12/2018 Follow up Recommendations Inpatient Rehab   CHL IP FREQUENCY AND DURATION 11/12/2018 Speech Therapy Frequency (ACUTE ONLY) min 2x/week Treatment Duration 2 weeks      CHL IP ORAL PHASE 11/12/2018 Oral Phase Impaired Oral - Pudding Teaspoon -- Oral - Pudding Cup -- Oral - Honey Teaspoon -- Oral - Honey Cup -- Oral - Nectar Teaspoon -- Oral - Nectar Cup Left anterior bolus loss Oral - Nectar Straw -- Oral - Thin Teaspoon -- Oral - Thin Cup Left anterior bolus loss Oral - Thin Straw Left anterior bolus loss Oral - Puree -- Oral - Mech Soft -- Oral - Regular Left anterior bolus loss;Delayed oral transit;Weak lingual manipulation Oral - Multi-Consistency -- Oral - Pill Weak lingual manipulation;Holding of bolus Oral Phase - Comment --  CHL IP PHARYNGEAL PHASE 11/12/2018 Pharyngeal Phase Impaired Pharyngeal- Pudding Teaspoon -- Pharyngeal -- Pharyngeal- Pudding  Cup -- Pharyngeal -- Pharyngeal- Honey Teaspoon -- Pharyngeal -- Pharyngeal- Honey Cup -- Pharyngeal -- Pharyngeal- Nectar Teaspoon -- Pharyngeal -- Pharyngeal- Nectar Cup Pharyngeal residue - valleculae;Pharyngeal residue - pyriform;Reduced epiglottic inversion Pharyngeal -- Pharyngeal- Nectar Straw -- Pharyngeal -- Pharyngeal- Thin Teaspoon -- Pharyngeal -- Pharyngeal- Thin Cup Reduced epiglottic inversion;Penetration/Aspiration during swallow Pharyngeal Material enters airway, remains ABOVE vocal cords then ejected out Pharyngeal- Thin Straw Reduced epiglottic inversion Pharyngeal -- Pharyngeal- Puree -- Pharyngeal --  Pharyngeal- Mechanical Soft -- Pharyngeal -- Pharyngeal- Regular Reduced epiglottic inversion;Pharyngeal residue - pyriform;Pharyngeal residue - valleculae Pharyngeal -- Pharyngeal- Multi-consistency -- Pharyngeal -- Pharyngeal- Pill NT Pharyngeal -- Pharyngeal Comment --  CHL IP CERVICAL ESOPHAGEAL PHASE 11/12/2018 Cervical Esophageal Phase WFL Pudding Teaspoon -- Pudding Cup -- Honey Teaspoon -- Honey Cup -- Nectar Teaspoon -- Nectar Cup -- Nectar Straw -- Thin Teaspoon -- Thin Cup -- Thin Straw -- Puree -- Mechanical Soft -- Regular -- Multi-consistency -- Pill -- Cervical Esophageal Comment -- Houston Siren 11/12/2018, 4:15 PM [name] Colvin Caroli.Ed Risk analyst [phone] Office [phone]                  Medical Problem List and Plan: 1.  Left-sided weakness with facial droop and dysphagia secondary to large right MCA infarction with hemorrhagic conversion, embolic secondary to known A. fib on Eliquis as well as history of left MCA infarction 2018 status post thrombectomy for left M2 occlusion.  Anticoagulation on hold due to hemorrhage  Admit to CIR 2.  Antithrombotics: -DVT/anticoagulation: Subcutaneous heparin initiated 11/11/2018  Monitor for bleeding  -antiplatelet therapy: N/A 3. Pain Management: Neurontin 300 mg 3 times daily,tramadol as  needed 4. Mood: Valium 5 mg twice daily as needed anxiety  -antipsychotic agents: N/A 5. Neuropsych: This patient is not fully capable of making decisions on his own behalf. 6. Skin/Wound Care: Routine skin checks 7. Fluids/Electrolytes/Nutrition: Routine I/Os  CMP ordered for tomorrow a.m. 8.  Atrial fibrillation status post ICD.  Follow cardiology services.  Patient with recent cardioversion.  Cardiac rate controlled 9.  Acute on chronic diastolic congestive heart failure.  Lasix 40 mg daily.  Monitor for any signs of fluid overload  Daily weights 10.  Nonischemic cardiomyopathy.  Ejection fraction 15 to 20%.  See #9 11.  Hypertension.  Coreg 25 mg twice daily, Aldactone 12.5 mg daily, Entresto 24-26 mg twice daily  Monitor with increased mobility 12.  Hyperlipidemia.  Lipitor 13.  Post stroke dysphagia.  Dysphasia #2 thin liquids.  Follow-up speech therapy 14.  AKI.  Creatinine 1.23-1.54  BMP ordered for tomorrow a.m. 15.  History of tobacco/marijuana abuse as well as history of remote cocaine use.  Provide counseling 16.  Medical noncompliance.  Counseling 17.  Chronic hep C.  Follow-up outpatient   Cathlyn Parsons, PA-C 11/13/2018   I have personally performed a face to face diagnostic evaluation, including, but not limited to relevant history and physical exam findings, of this patient and developed relevant assessment and plan.  Additionally, I have reviewed and concur with the physician assistant's documentation above.  Delice Lesch, MD, ABPMR

## 2018-11-13 ENCOUNTER — Encounter (HOSPITAL_COMMUNITY): Payer: Self-pay

## 2018-11-13 ENCOUNTER — Inpatient Hospital Stay (HOSPITAL_COMMUNITY): Payer: Medicare HMO

## 2018-11-13 ENCOUNTER — Other Ambulatory Visit: Payer: Self-pay

## 2018-11-13 ENCOUNTER — Inpatient Hospital Stay (HOSPITAL_COMMUNITY)
Admission: RE | Admit: 2018-11-13 | Discharge: 2018-12-02 | DRG: 056 | Disposition: A | Discharge status: Other Acute Inpatient Hospital | Payer: Medicare HMO | Source: Intra-hospital | Attending: Physical Medicine and Rehabilitation | Admitting: Physical Medicine and Rehabilitation

## 2018-11-13 DIAGNOSIS — I824Z3 Acute embolism and thrombosis of unspecified deep veins of distal lower extremity, bilateral: Secondary | ICD-10-CM | POA: Diagnosis not present

## 2018-11-13 DIAGNOSIS — Z91199 Patient's noncompliance with other medical treatment and regimen due to unspecified reason: Secondary | ICD-10-CM

## 2018-11-13 DIAGNOSIS — I4891 Unspecified atrial fibrillation: Secondary | ICD-10-CM | POA: Diagnosis not present

## 2018-11-13 DIAGNOSIS — E8770 Fluid overload, unspecified: Secondary | ICD-10-CM

## 2018-11-13 DIAGNOSIS — Z8673 Personal history of transient ischemic attack (TIA), and cerebral infarction without residual deficits: Secondary | ICD-10-CM | POA: Diagnosis not present

## 2018-11-13 DIAGNOSIS — B182 Chronic viral hepatitis C: Secondary | ICD-10-CM | POA: Diagnosis present

## 2018-11-13 DIAGNOSIS — N179 Acute kidney failure, unspecified: Secondary | ICD-10-CM

## 2018-11-13 DIAGNOSIS — R509 Fever, unspecified: Secondary | ICD-10-CM | POA: Diagnosis not present

## 2018-11-13 DIAGNOSIS — I82403 Acute embolism and thrombosis of unspecified deep veins of lower extremity, bilateral: Secondary | ICD-10-CM | POA: Diagnosis not present

## 2018-11-13 DIAGNOSIS — I639 Cerebral infarction, unspecified: Secondary | ICD-10-CM | POA: Diagnosis not present

## 2018-11-13 DIAGNOSIS — R109 Unspecified abdominal pain: Secondary | ICD-10-CM | POA: Diagnosis not present

## 2018-11-13 DIAGNOSIS — M7989 Other specified soft tissue disorders: Secondary | ICD-10-CM | POA: Diagnosis not present

## 2018-11-13 DIAGNOSIS — Z823 Family history of stroke: Secondary | ICD-10-CM

## 2018-11-13 DIAGNOSIS — I428 Other cardiomyopathies: Secondary | ICD-10-CM | POA: Diagnosis not present

## 2018-11-13 DIAGNOSIS — Z9119 Patient's noncompliance with other medical treatment and regimen: Secondary | ICD-10-CM

## 2018-11-13 DIAGNOSIS — I5021 Acute systolic (congestive) heart failure: Secondary | ICD-10-CM | POA: Diagnosis not present

## 2018-11-13 DIAGNOSIS — I5033 Acute on chronic diastolic (congestive) heart failure: Secondary | ICD-10-CM | POA: Diagnosis present

## 2018-11-13 DIAGNOSIS — I4819 Other persistent atrial fibrillation: Secondary | ICD-10-CM | POA: Diagnosis not present

## 2018-11-13 DIAGNOSIS — J690 Pneumonitis due to inhalation of food and vomit: Secondary | ICD-10-CM | POA: Diagnosis not present

## 2018-11-13 DIAGNOSIS — I5042 Chronic combined systolic (congestive) and diastolic (congestive) heart failure: Secondary | ICD-10-CM | POA: Diagnosis not present

## 2018-11-13 DIAGNOSIS — J9601 Acute respiratory failure with hypoxia: Secondary | ICD-10-CM | POA: Diagnosis not present

## 2018-11-13 DIAGNOSIS — D62 Acute posthemorrhagic anemia: Secondary | ICD-10-CM | POA: Diagnosis not present

## 2018-11-13 DIAGNOSIS — G939 Disorder of brain, unspecified: Secondary | ICD-10-CM | POA: Diagnosis present

## 2018-11-13 DIAGNOSIS — I361 Nonrheumatic tricuspid (valve) insufficiency: Secondary | ICD-10-CM | POA: Diagnosis not present

## 2018-11-13 DIAGNOSIS — I509 Heart failure, unspecified: Secondary | ICD-10-CM

## 2018-11-13 DIAGNOSIS — R6521 Severe sepsis with septic shock: Secondary | ICD-10-CM | POA: Diagnosis not present

## 2018-11-13 DIAGNOSIS — I69392 Facial weakness following cerebral infarction: Secondary | ICD-10-CM

## 2018-11-13 DIAGNOSIS — I63031 Cerebral infarction due to thrombosis of right carotid artery: Secondary | ICD-10-CM | POA: Diagnosis not present

## 2018-11-13 DIAGNOSIS — R110 Nausea: Secondary | ICD-10-CM

## 2018-11-13 DIAGNOSIS — R05 Cough: Secondary | ICD-10-CM | POA: Diagnosis not present

## 2018-11-13 DIAGNOSIS — G8929 Other chronic pain: Secondary | ICD-10-CM

## 2018-11-13 DIAGNOSIS — I82413 Acute embolism and thrombosis of femoral vein, bilateral: Secondary | ICD-10-CM | POA: Diagnosis not present

## 2018-11-13 DIAGNOSIS — Z79899 Other long term (current) drug therapy: Secondary | ICD-10-CM

## 2018-11-13 DIAGNOSIS — M7061 Trochanteric bursitis, right hip: Secondary | ICD-10-CM | POA: Diagnosis present

## 2018-11-13 DIAGNOSIS — I5022 Chronic systolic (congestive) heart failure: Secondary | ICD-10-CM | POA: Diagnosis not present

## 2018-11-13 DIAGNOSIS — I5043 Acute on chronic combined systolic (congestive) and diastolic (congestive) heart failure: Secondary | ICD-10-CM

## 2018-11-13 DIAGNOSIS — R059 Cough, unspecified: Secondary | ICD-10-CM

## 2018-11-13 DIAGNOSIS — I69391 Dysphagia following cerebral infarction: Secondary | ICD-10-CM

## 2018-11-13 DIAGNOSIS — F17210 Nicotine dependence, cigarettes, uncomplicated: Secondary | ICD-10-CM | POA: Diagnosis present

## 2018-11-13 DIAGNOSIS — I340 Nonrheumatic mitral (valve) insufficiency: Secondary | ICD-10-CM | POA: Diagnosis not present

## 2018-11-13 DIAGNOSIS — I255 Ischemic cardiomyopathy: Secondary | ICD-10-CM | POA: Diagnosis not present

## 2018-11-13 DIAGNOSIS — R519 Headache, unspecified: Secondary | ICD-10-CM | POA: Diagnosis not present

## 2018-11-13 DIAGNOSIS — I5023 Acute on chronic systolic (congestive) heart failure: Secondary | ICD-10-CM

## 2018-11-13 DIAGNOSIS — M25551 Pain in right hip: Secondary | ICD-10-CM

## 2018-11-13 DIAGNOSIS — G936 Cerebral edema: Secondary | ICD-10-CM

## 2018-11-13 DIAGNOSIS — R058 Other specified cough: Secondary | ICD-10-CM

## 2018-11-13 DIAGNOSIS — I4811 Longstanding persistent atrial fibrillation: Secondary | ICD-10-CM | POA: Diagnosis not present

## 2018-11-13 DIAGNOSIS — Z7901 Long term (current) use of anticoagulants: Secondary | ICD-10-CM

## 2018-11-13 DIAGNOSIS — Z95810 Presence of automatic (implantable) cardiac defibrillator: Secondary | ICD-10-CM

## 2018-11-13 DIAGNOSIS — K5900 Constipation, unspecified: Secondary | ICD-10-CM | POA: Diagnosis present

## 2018-11-13 DIAGNOSIS — I110 Hypertensive heart disease with heart failure: Secondary | ICD-10-CM | POA: Diagnosis present

## 2018-11-13 DIAGNOSIS — N319 Neuromuscular dysfunction of bladder, unspecified: Secondary | ICD-10-CM | POA: Diagnosis present

## 2018-11-13 DIAGNOSIS — Z720 Tobacco use: Secondary | ICD-10-CM

## 2018-11-13 DIAGNOSIS — Z7982 Long term (current) use of aspirin: Secondary | ICD-10-CM | POA: Diagnosis not present

## 2018-11-13 DIAGNOSIS — R51 Headache: Secondary | ICD-10-CM | POA: Diagnosis not present

## 2018-11-13 DIAGNOSIS — F1290 Cannabis use, unspecified, uncomplicated: Secondary | ICD-10-CM

## 2018-11-13 DIAGNOSIS — I493 Ventricular premature depolarization: Secondary | ICD-10-CM | POA: Diagnosis not present

## 2018-11-13 DIAGNOSIS — I63411 Cerebral infarction due to embolism of right middle cerebral artery: Secondary | ICD-10-CM | POA: Diagnosis not present

## 2018-11-13 DIAGNOSIS — E785 Hyperlipidemia, unspecified: Secondary | ICD-10-CM

## 2018-11-13 DIAGNOSIS — I10 Essential (primary) hypertension: Secondary | ICD-10-CM | POA: Diagnosis not present

## 2018-11-13 DIAGNOSIS — N183 Chronic kidney disease, stage 3 unspecified: Secondary | ICD-10-CM

## 2018-11-13 DIAGNOSIS — I69354 Hemiplegia and hemiparesis following cerebral infarction affecting left non-dominant side: Principal | ICD-10-CM

## 2018-11-13 DIAGNOSIS — R0682 Tachypnea, not elsewhere classified: Secondary | ICD-10-CM | POA: Diagnosis not present

## 2018-11-13 DIAGNOSIS — I6930 Unspecified sequelae of cerebral infarction: Secondary | ICD-10-CM | POA: Diagnosis not present

## 2018-11-13 DIAGNOSIS — A419 Sepsis, unspecified organism: Secondary | ICD-10-CM | POA: Diagnosis not present

## 2018-11-13 DIAGNOSIS — R7309 Other abnormal glucose: Secondary | ICD-10-CM | POA: Diagnosis not present

## 2018-11-13 DIAGNOSIS — I63311 Cerebral infarction due to thrombosis of right middle cerebral artery: Secondary | ICD-10-CM | POA: Diagnosis not present

## 2018-11-13 DIAGNOSIS — E876 Hypokalemia: Secondary | ICD-10-CM | POA: Diagnosis not present

## 2018-11-13 DIAGNOSIS — J9811 Atelectasis: Secondary | ICD-10-CM | POA: Diagnosis not present

## 2018-11-13 DIAGNOSIS — I63511 Cerebral infarction due to unspecified occlusion or stenosis of right middle cerebral artery: Secondary | ICD-10-CM

## 2018-11-13 DIAGNOSIS — N1831 Chronic kidney disease, stage 3a: Secondary | ICD-10-CM | POA: Diagnosis not present

## 2018-11-13 DIAGNOSIS — R0602 Shortness of breath: Secondary | ICD-10-CM

## 2018-11-13 HISTORY — DX: Cerebral edema: G93.6

## 2018-11-13 HISTORY — DX: Cerebral infarction due to unspecified occlusion or stenosis of right middle cerebral artery: I63.511

## 2018-11-13 HISTORY — DX: Cannabis use, unspecified, uncomplicated: F12.90

## 2018-11-13 HISTORY — DX: Hyperlipidemia, unspecified: E78.5

## 2018-11-13 LAB — CBC
HCT: 42.2 % (ref 39.0–52.0)
HCT: 47.9 % (ref 39.0–52.0)
Hemoglobin: 14.4 g/dL (ref 13.0–17.0)
Hemoglobin: 15.9 g/dL (ref 13.0–17.0)
MCH: 32.4 pg (ref 26.0–34.0)
MCH: 31.9 pg (ref 26.0–34.0)
MCHC: 34.1 g/dL (ref 30.0–36.0)
MCHC: 33.2 g/dL (ref 30.0–36.0)
MCV: 95 fL (ref 80.0–100.0)
MCV: 96.2 fL (ref 80.0–100.0)
Platelets: 132 10*3/uL — ABNORMAL LOW (ref 150–400)
Platelets: 157 10*3/uL (ref 150–400)
RBC: 4.44 MIL/uL (ref 4.22–5.81)
RBC: 4.98 MIL/uL (ref 4.22–5.81)
RDW: 12.8 % (ref 11.5–15.5)
RDW: 13 % (ref 11.5–15.5)
WBC: 8.8 10*3/uL (ref 4.0–10.5)
WBC: 8.9 10*3/uL (ref 4.0–10.5)
nRBC: 0 % (ref 0.0–0.2)
nRBC: 0 % (ref 0.0–0.2)

## 2018-11-13 LAB — BASIC METABOLIC PANEL
Anion gap: 12 (ref 5–15)
BUN: 13 mg/dL (ref 8–23)
CO2: 21 mmol/L — ABNORMAL LOW (ref 22–32)
Calcium: 8.8 mg/dL — ABNORMAL LOW (ref 8.9–10.3)
Chloride: 108 mmol/L (ref 98–111)
Creatinine, Ser: 1.38 mg/dL — ABNORMAL HIGH (ref 0.61–1.24)
GFR calc Af Amer: >60 mL/min (ref >60)
GFR calc non Af Amer: 53 mL/min — ABNORMAL LOW (ref >60)
Glucose, Bld: 102 mg/dL — ABNORMAL HIGH (ref 70–99)
Potassium: 4.1 mmol/L (ref 3.5–5.1)
Sodium: 141 mmol/L (ref 135–145)

## 2018-11-13 LAB — CREATININE, SERUM
Creatinine, Ser: 1.35 mg/dL — ABNORMAL HIGH (ref 0.61–1.24)
GFR calc Af Amer: >60 mL/min (ref >60)
GFR calc non Af Amer: 54 mL/min — ABNORMAL LOW (ref >60)

## 2018-11-13 MED ORDER — GABAPENTIN 300 MG PO CAPS
300.0000 mg | ORAL_CAPSULE | Freq: Three times a day (TID) | ORAL | Status: DC
  Administered 2018-11-13 – 2018-12-02 (×56): 300 mg via ORAL
  Filled 2018-11-13 (×57): qty 1

## 2018-11-13 MED ORDER — SORBITOL 70 % SOLN
30.0000 mL | Freq: Every day | Status: DC | PRN
  Filled 2018-11-13: qty 30

## 2018-11-13 MED ORDER — HEPARIN SODIUM (PORCINE) 5000 UNIT/ML IJ SOLN
5000.0000 [IU] | Freq: Three times a day (TID) | INTRAMUSCULAR | Status: DC
  Administered 2018-11-13 – 2018-11-14 (×2): 5000 [IU] via SUBCUTANEOUS
  Filled 2018-11-13 (×2): qty 1

## 2018-11-13 MED ORDER — SODIUM CHLORIDE 0.9% FLUSH: 3.0000 mL | Freq: Once | INTRAVENOUS | 0 refills | Status: DC

## 2018-11-13 MED ORDER — POTASSIUM CHLORIDE CRYS ER 20 MEQ PO TBCR
20.0000 meq | EXTENDED_RELEASE_TABLET | Freq: Every day | ORAL | Status: DC
  Administered 2018-11-14 – 2018-12-02 (×19): 20 meq via ORAL
  Filled 2018-11-13 (×20): qty 1

## 2018-11-13 MED ORDER — TRAMADOL HCL 50 MG PO TABS
50.0000 mg | ORAL_TABLET | Freq: Three times a day (TID) | ORAL | Status: DC | PRN
  Administered 2018-11-13 – 2018-11-20 (×5): 50 mg via ORAL
  Filled 2018-11-13 (×6): qty 1

## 2018-11-13 MED ORDER — SENNOSIDES-DOCUSATE SODIUM 8.6-50 MG PO TABS: 1.0000 | ORAL_TABLET | Freq: Two times a day (BID) | ORAL | Status: DC

## 2018-11-13 MED ORDER — TRAMADOL HCL 50 MG PO TABS: 50.0000 mg | ORAL_TABLET | Freq: Three times a day (TID) | ORAL | Status: DC | PRN

## 2018-11-13 MED ORDER — SODIUM CHLORIDE 0.9 % IV SOLN: 40.0000 mL | INTRAVENOUS | 0 refills | Status: DC

## 2018-11-13 MED ORDER — ACETAMINOPHEN 325 MG PO TABS
650.0000 mg | ORAL_TABLET | ORAL | Status: DC | PRN
  Administered 2018-11-13 – 2018-12-01 (×23): 650 mg via ORAL
  Filled 2018-11-13 (×23): qty 2

## 2018-11-13 MED ORDER — ACETAMINOPHEN 160 MG/5ML PO SOLN: 650.0000 mg | ORAL | Status: DC | PRN

## 2018-11-13 MED ORDER — METOPROLOL TARTRATE 5 MG/5ML IV SOLN
2.5000 mg | INTRAVENOUS | Status: DC | PRN
  Filled 2018-11-13: qty 5

## 2018-11-13 MED ORDER — HEPARIN SODIUM (PORCINE) 5000 UNIT/ML IJ SOLN: 5000.0000 [IU] | Freq: Three times a day (TID) | INTRAMUSCULAR | Status: DC

## 2018-11-13 MED ORDER — SACUBITRIL-VALSARTAN 24-26 MG PO TABS
1.0000 | ORAL_TABLET | Freq: Two times a day (BID) | ORAL | Status: DC
  Administered 2018-11-13 – 2018-12-02 (×37): 1 via ORAL
  Filled 2018-11-13 (×41): qty 1

## 2018-11-13 MED ORDER — SENNOSIDES-DOCUSATE SODIUM 8.6-50 MG PO TABS
1.0000 | ORAL_TABLET | Freq: Two times a day (BID) | ORAL | Status: DC
  Administered 2018-11-13 – 2018-12-02 (×37): 1 via ORAL
  Filled 2018-11-13 (×38): qty 1

## 2018-11-13 MED ORDER — ATORVASTATIN CALCIUM 10 MG PO TABS
20.0000 mg | ORAL_TABLET | Freq: Every day | ORAL | Status: DC
  Administered 2018-11-13 – 2018-12-01 (×19): 20 mg via ORAL
  Filled 2018-11-13 (×19): qty 2

## 2018-11-13 MED ORDER — ATORVASTATIN CALCIUM 20 MG PO TABS: 20.0000 mg | ORAL_TABLET | Freq: Every day | ORAL | Status: DC

## 2018-11-13 MED ORDER — FUROSEMIDE 40 MG PO TABS
40.0000 mg | ORAL_TABLET | Freq: Every day | ORAL | Status: DC
  Administered 2018-11-14 – 2018-12-02 (×19): 40 mg via ORAL
  Filled 2018-11-13 (×19): qty 1

## 2018-11-13 MED ORDER — DIAZEPAM 5 MG PO TABS: 5.0000 mg | ORAL_TABLET | Freq: Two times a day (BID) | ORAL | 0 refills | Status: DC | PRN

## 2018-11-13 MED ORDER — B-12 500 MCG PO TABS: 500.0000 ug | ORAL_TABLET | Freq: Every day | ORAL | Status: DC

## 2018-11-13 MED ORDER — ACETAMINOPHEN 650 MG RE SUPP
650.0000 mg | RECTAL | Status: DC | PRN
  Administered 2018-12-02: 650 mg via RECTAL
  Filled 2018-11-13: qty 1

## 2018-11-13 MED ORDER — SPIRONOLACTONE 12.5 MG HALF TABLET
12.5000 mg | ORAL_TABLET | Freq: Every day | ORAL | Status: DC
  Administered 2018-11-14 – 2018-12-02 (×19): 12.5 mg via ORAL
  Filled 2018-11-13 (×20): qty 1

## 2018-11-13 MED ORDER — CARVEDILOL 25 MG PO TABS
25.0000 mg | ORAL_TABLET | Freq: Two times a day (BID) | ORAL | Status: DC
  Administered 2018-11-13 – 2018-11-14 (×2): 25 mg via ORAL
  Filled 2018-11-13 (×2): qty 1

## 2018-11-13 NOTE — H&P (Signed)
Physical Medicine and Rehabilitation Admission H&P    Chief Complaint  Patient presents with   Code Stroke  : HPI: Glenn [name] is a 68 year old right-handed male with history of atrial fibrillation maintained on Glenn [name] status post ICD as well as recent cardioversion [id number], diastolic congestive heart failure with ejection fraction of 15 to 20%, hepatitis C, hypertension  and prior left MCA infarction 2018 status post thrombectomy for left M2 occlusion, tobacco abuse as well as remote history of cocaine and medical noncompliance.  History taken from chart review patient due to dysarthria and lethargy.  Patient lives with a roomate.  1 level home 5 steps to enter.  Reportedly independent working as a Dealer. He has a son in the area.  Presented 11/10/2018 with left hemiparesis and facial droop.  Noted BUN 1.54, COVID negative.  Cranial CT scan showed large right MCA subacute hemorrhage.  Area of hemorrhage measuring approximate 2 x 3 cm centered in the head of the caudate and putamen on the right.  Patient did not receive TPA.  CT angiogram of head and neck showed subtotal occlusion right M1 segment with irregularity most consistent with thrombus.  Patient's Glenn [name] was reversed with Glenn [name].  Echocardiogram with ejection fraction 15-20% Neurology follow-up initially maintained on hypertonic saline later discontinued.  Follow-up cardiology services for nonischemic cardiomyopathy as well as acute on chronic congestive heart failure and currently maintained on Coreg, Lasix, Aldactone as well as Entresto.  Patient currently remains off anticoagulation however subcutaneous heparin was initiated for DVT prophylaxis 11/11/2018.  Patient on dysphagia to thin liquid diet.  Therapy evaluations completed and patient was admitted for a comprehensive rehab program  Review of Systems  Constitutional: Negative for fever.  HENT: Negative for hearing loss.   Eyes: Negative for blurred vision and double  vision.  Respiratory: Negative for cough and shortness of breath.   Gastrointestinal: Positive for constipation. Negative for heartburn, nausea and vomiting.  Genitourinary: Negative for dysuria, flank pain and hematuria.  Musculoskeletal: Positive for myalgias.  Skin: Negative for rash.  Neurological: Positive for speech change, focal weakness and weakness.  Psychiatric/Behavioral: The patient has insomnia.    Past Medical History:  Diagnosis Date   Atrial fibrillation [facility])    CHF (congestive heart failure) ([place])    Hepatitis C    Hypertension    Stroke [facility])    Visit for monitoring Glenn [name] therapy 03/26/2017   Past Surgical History:  Procedure Laterality Date   CARDIAC DEFIBRILLATOR PLACEMENT  2015   CARDIOVERSION N/A 10/10/2016   Procedure: CARDIOVERSION;  Surgeon: Dorothy Spark, MD;  Location: [place];  Service: Cardiovascular;  Laterality: N/A;   CARDIOVERSION N/A 03/27/2017   Procedure: CARDIOVERSION;  Surgeon: Jerline Pain, MD;  Location: [place];  Service: Cardiovascular;  Laterality: N/A;   CARDIOVERSION N/A 10/29/2018   Procedure: CARDIOVERSION;  Surgeon: Sanda Klein, MD;  Location: [place];  Service: Cardiovascular;  Laterality: N/A;   CARDIOVERSION N/A 11/05/2018   Procedure: CARDIOVERSION;  Surgeon: Nahser, Wonda Cheng, MD;  Location: [place];  Service: Cardiovascular;  Laterality: N/A;   EYE SURGERY Left 1990   IR PERCUTANEOUS ART THROMBECTOMY/INFUSION INTRACRANIAL INC DIAG ANGIO  09/05/2016   IR RADIOLOGIST EVAL & MGMT  10/03/2016   RADIOLOGY WITH ANESTHESIA N/A 09/05/2016   Procedure: RADIOLOGY WITH ANESTHESIA;  Surgeon: Luanne Bras, MD;  Location: [place];  Service: Radiology;  Laterality: N/A;   RIGHT/LEFT HEART CATH AND CORONARY ANGIOGRAPHY N/A 11/03/2018   Procedure: RIGHT/LEFT HEART CATH AND  CORONARY ANGIOGRAPHY;  Surgeon: Lorretta Harp, MD;  Location: [place] CV LAB;  Service: Cardiovascular;  Laterality: N/A;    Family History  Problem Relation Age of Onset   High blood pressure Mother    High blood pressure Father    Stroke Maternal Aunt    Heart disease Neg Hx    Social History:  reports that he has been smoking cigarettes. He has been smoking about 0.50 packs per day. He has never used smokeless tobacco. He reports previous alcohol use of about 3.0 standard drinks of alcohol per week. He reports current drug use. Frequency: 2.00 times per week. Drug: Marijuana. Allergies:  Allergies  Allergen Reactions   Benadryl [Diphenhydramine] Palpitations   Medications Prior to Admission  Medication Sig Dispense Refill   albuterol (VENTOLIN HFA) 108 (90 Base) MCG/ACT inhaler Inhale 2 puffs into the lungs every 4 (four) hours as needed for shortness of breath.     apixaban (Glenn [name]) 5 MG TABS tablet Take 1 tablet (5 mg total) by mouth 2 (two) times daily. 60 tablet 0   aspirin EC 325 MG tablet Take 162.5 mg by mouth daily.     carvedilol (COREG) 25 MG tablet Take 1 tablet (25 mg total) by mouth 2 (two) times daily with a meal. 60 tablet 0   diazepam (VALIUM) 5 MG tablet TAKE 1/2 TABLET(2.5 MG) BY MOUTH EVERY 8 HOURS AS NEEDED FOR ANXIETY (Patient taking differently: Take 5 mg by mouth 2 (two) times daily as needed for anxiety. ) 45 tablet 0   dofetilide (Glenn [name]) 250 MCG capsule Take 1 capsule (250 mcg total) by mouth 2 (two) times daily. 60 capsule 0   furosemide (LASIX) 40 MG tablet Take 1 tablet (40 mg total) by mouth daily. 30 tablet 0   gabapentin (NEURONTIN) 300 MG capsule TAKE 1 CAPSULE(300 MG) BY MOUTH THREE TIMES DAILY (Patient taking differently: Take 300 mg by mouth 3 (three) times daily. ) 180 capsule 0   potassium chloride SA (K-DUR) 20 MEQ tablet Take 1 tablet (20 mEq total) by mouth daily. 30 tablet 0   sacubitril-valsartan (ENTRESTO) 24-26 MG Take 1 tablet by mouth 2 (two) times daily. 60 tablet 0   spironolactone (ALDACTONE) 25 MG tablet Take 0.5 tablets (12.5 mg total) by  mouth daily. 30 tablet 0   vitamin C (ASCORBIC ACID) 500 MG tablet Take 500 mg by mouth daily.     [DISCONTINUED] Cyanocobalamin (B-12) 500 MCG TABS Take 500 mcg by mouth daily as needed (energy). (Patient taking differently: Take 500 mcg by mouth daily. ) 30 tablet 2   Glecaprevir-Pibrentasvir (MAVYRET) 100-40 MG TABS Take 3 tablets by mouth daily with supper. (Patient not taking: Reported on 11/10/2018) 90 tablet 0    Drug Regimen Review Drug regimen was reviewed and remains appropriate with no significant issues identified  Home: Home Living Family/patient expects to be discharged to:: Private residence Living Arrangements: Other (Comment)(friend/room mate Education administrator) Available Help at Discharge: Family, Available 24 hours/day Type of Home: House Home Access: Stairs to enter [employer] of Steps: 5 Home Layout: One level Bathroom Shower/Tub: Chiropodist: Standard Bathroom Accessibility: Yes Home Equipment: None  Lives With: Friend(s)   Functional History: Prior Function Level of Independence: Independent Comments: Worked as a Insurance underwriter Status:  Mobility: Bed Mobility Overal bed mobility: Needs Assistance Bed Mobility: Supine to Sit Supine to sit: Min assist General bed mobility comments: reached for hand held assist to come up to sit and needed cues  for righting trunk in sitting Transfers Overall transfer level: Needs assistance Equipment used: Rolling walker (2 wheeled) Transfers: Sit to/from Stand Sit to Stand: Min guard, Min assist, +2 safety/equipment General transfer comment: Pt performing sit>stand with Min guard A for safety and assistance to place LUE onto RW; pt pulling with RUE on RW. Pt requiring Min A for safe descent to recliner Ambulation/Gait Ambulation/Gait assistance: Mod assist, +2 physical assistance Gait Distance (Feet): 65 Feet Assistive device: Rolling walker (2 wheeled) Gait Pattern/deviations: Step-to  pattern, Decreased stride length, Decreased step length - left, Decreased dorsiflexion - left, Steppage, Shuffle General Gait Details: leaning L and with dragging L foot at times, over time with increased L lateral lean and needing max facilitation for R weight shift for balance and to clear L foot.  Patient felt as if I was contributing to his LOB by assisting him to weight bear on R LE.    ADL: ADL Overall ADL's : Needs assistance/impaired Eating/Feeding: NPO Grooming: Minimal assistance, Sitting Upper Body Bathing: Minimal assistance, Sitting Lower Body Bathing: Maximal assistance, +2 for safety/equipment, Sit to/from stand, +2 for physical assistance Upper Body Dressing : Moderate assistance, Sitting Lower Body Dressing: Maximal assistance, +2 for safety/equipment, +2 for physical assistance, Sit to/from stand Toilet Transfer: Ambulation, RW, Minimal assistance, +2 for safety/equipment(Simulated to recliner) Toilet Transfer Details (indicate cue type and reason): Pt performing sit>stand with Min guard A for safety and assistance to place LUE onto RW. Pt requiring Min A for safe descent to recliner Functional mobility during ADLs: Maximal assistance, +2 for physical assistance, +2 for safety/equipment, Rolling walker, Moderate assistance General ADL Comments: Pt presenting with poor balance, strength, cognition, attention of left side, vision, and activity tolerance.  Cognition: Cognition Overall Cognitive Status: Impaired/Different from baseline Arousal/Alertness: Awake/alert Orientation Level: Oriented X4 Attention: Sustained Sustained Attention: Appears intact Memory: (TBA further suspect deficits) Awareness: Impaired Awareness Impairment: Emergent impairment, Anticipatory impairment Problem Solving: Impaired Problem Solving Impairment: Functional complex Safety/Judgment: Impaired Cognition Arousal/Alertness: Awake/alert Behavior During Therapy: WFL for tasks  assessed/performed Overall Cognitive Status: Impaired/Different from baseline Area of Impairment: Orientation, Attention, Memory, Following commands, Safety/judgement, Awareness, Problem solving Orientation Level: Disoriented to, Place, Time, Situation Current Attention Level: Sustained Memory: Decreased recall of precautions, Decreased short-term memory Following Commands: Follows one step commands inconsistently, Follows one step commands with increased time Safety/Judgement: Decreased awareness of safety, Decreased awareness of deficits Awareness: Intellectual Problem Solving: Slow processing, Difficulty sequencing, Requires verbal cues, Requires tactile cues General Comments: Pt requiring increased cues and time throughout session. Pt with poor awareness of deficits. Pt with poor attention of left side (body and environment)  Physical Exam: Blood pressure 111/61, pulse 61, temperature 98.2 F (36.8 C), temperature source Oral, resp. rate 17, SpO2 98 %. Physical Exam  Vitals reviewed. Constitutional: He appears well-developed and well-nourished.  HENT:  Head: Normocephalic.  Eyes: Right eye exhibits no discharge. Left eye exhibits no discharge.  ?Difficulty with left gaze Left scleral hemorrhage  Neck: No tracheal deviation present. No thyromegaly present.  Respiratory: Effort normal.  GI: He exhibits no distension.  Musculoskeletal:     Comments: No edema or tenderness in extremities  Neurological:  Lethargic Moderate to severe dysarthria  Follows simple commands. Right facial weakness Right upper extremity: 5/5 proximal distal Right lower extremity: 4-4+/5 proximal distal Left upper extremity: 1+-2-/5 proximal distal Left lower extremity: 3-/5 proximal to distal  Skin: Skin is warm and dry.  Psychiatric: His affect is blunt. His speech is delayed and slurred. He is  slowed.    Results for orders placed or performed during the hospital encounter of 11/10/18 (from the past  48 hour(s))  Glucose, capillary     Status: None   Collection Time: 11/11/18  8:54 PM  Result Value Ref Range   Glucose-Capillary 98 70 - 99 mg/dL   Comment 1 Notify RN   Glucose, capillary     Status: None   Collection Time: 11/11/18 11:54 PM  Result Value Ref Range   Glucose-Capillary 90 70 - 99 mg/dL  Glucose, capillary     Status: None   Collection Time: 11/12/18  3:37 AM  Result Value Ref Range   Glucose-Capillary 97 70 - 99 mg/dL  CBC     Status: Abnormal   Collection Time: 11/12/18  5:18 AM  Result Value Ref Range   WBC 8.9 4.0 - 10.5 K/uL   RBC 4.48 4.22 - 5.81 MIL/uL   Hemoglobin 14.7 13.0 - 17.0 g/dL   HCT 44.0 39.0 - 52.0 %   MCV 98.2 80.0 - 100.0 fL   MCH 32.8 26.0 - 34.0 pg   MCHC 33.4 30.0 - 36.0 g/dL   RDW 13.2 11.5 - 15.5 %   Platelets 121 (L) 150 - 400 K/uL    Comment: REPEATED TO VERIFY PLATELET COUNT CONFIRMED BY SMEAR Immature Platelet Fraction may be clinically indicated, consider ordering this additional test [id number]    nRBC 0.0 0.0 - 0.2 %    Comment: Performed at [facility], [place] [address], [place], [place] [id number]  Basic metabolic panel     Status: Abnormal   Collection Time: 11/12/18  5:18 AM  Result Value Ref Range   Sodium 141 135 - 145 mmol/L   Potassium 4.5 3.5 - 5.1 mmol/L   Chloride 110 98 - 111 mmol/L   CO2 21 (L) 22 - 32 mmol/L   Glucose, Bld 105 (H) 70 - 99 mg/dL   BUN 15 8 - 23 mg/dL   Creatinine, Ser 1.33 (H) 0.61 - 1.24 mg/dL   Calcium 8.6 (L) 8.9 - 10.3 mg/dL   GFR calc non Af Amer 55 (L) >60 mL/min   GFR calc Af Amer >60 >60 mL/min   Anion gap 10 5 - 15    Comment: Performed at [place] [address], [place], [place] [address]  CBC     Status: Abnormal   Collection Time: 11/13/18  4:02 AM  Result Value Ref Range   WBC 8.8 4.0 - 10.5 K/uL   RBC 4.44 4.22 - 5.81 MIL/uL   Hemoglobin 14.4 13.0 - 17.0 g/dL   HCT 42.2 39.0 - 52.0 %   MCV 95.0 80.0 - 100.0 fL   MCH 32.4 26.0 - 34.0 pg   MCHC 34.1 30.0  - 36.0 g/dL   RDW 12.8 11.5 - 15.5 %   Platelets 132 (L) 150 - 400 K/uL    Comment: REPEATED TO VERIFY   nRBC 0.0 0.0 - 0.2 %    Comment: Performed at [facility], [place] [address] [place], [place] [id number]  Basic metabolic panel     Status: Abnormal   Collection Time: 11/13/18  4:02 AM  Result Value Ref Range   Sodium 141 135 - 145 mmol/L   Potassium 4.1 3.5 - 5.1 mmol/L   Chloride 108 98 - 111 mmol/L   CO2 21 (L) 22 - 32 mmol/L   Glucose, Bld 102 (H) 70 - 99 mg/dL   BUN 13 8 -  23 mg/dL   Creatinine, Ser 1.38 (H) 0.61 - 1.24 mg/dL   Calcium 8.8 (L) 8.9 - 10.3 mg/dL   GFR calc non Af Amer 53 (L) >60 mL/min   GFR calc Af Amer >60 >60 mL/min   Anion gap 12 5 - 15    Comment: Performed at [facility] [address] [place], [place] [address]   [name] Contrast  Result Date: 11/12/2018 CLINICAL DATA:  Follow-up stroke.  Intracranial hemorrhage. EXAM: MRI HEAD WITHOUT CONTRAST TECHNIQUE: Multiplanar, multiecho pulse sequences of the brain and surrounding structures were obtained without intravenous contrast. COMPARISON:  Head CT 11/11/2018 and 11/10/2018 FINDINGS: Brain: Diffusion imaging redemonstrates acute infarction in the anterior portion of the right middle cerebral artery territory and possibly some of the anterior cerebral artery territory, with confluent involvement of the right anterior frontal lobe, frontal operculum region, insula, basal ganglia and frontoparietal junction region. Hemorrhage into the right basal ganglia appears similar, maximal dimension 2 cm. Layering intraventricular blood in the occipital horn of the right lateral ventricle as seen previously. No acute infarction in the left hemisphere or affecting the brainstem or cerebellum. Swelling in the region of infarction, with right-to-left shift measuring up to 8 mm. No ventricular trapping. No extra-axial collection. Vascular: Major vessels at the base of the brain show flow. Skull and upper cervical  spine: Negative Sinuses/Orbits: Clear/normal Other: None IMPRESSION: No evidence of extension of the right brain infarction. Acute infarction affecting the right frontal lobe, frontal operculum, insula and frontoparietal junction region, with involvement of the right basal ganglia. 2 cm hematoma in the right basal ganglia has not enlarged. Intraventricular blood has not increased. Swelling and mass effect, right-to-left shift measuring up to 8 mm, unchanged when measured in the same location on yesterday's CT. Electronically Signed   By: Nelson Chimes M.D.   On: 11/12/2018 16:12   Dg Swallowing Func-speech Pathology  Result Date: 11/12/2018 Objective Swallowing Evaluation: Type of Study: Bedside Swallow Evaluation  Patient Details Name: Glenn [name] MRN: [id number] Date of Birth: 11-15-1950 Today's Date: 11/12/2018 Time: SLP Start Time (ACUTE ONLY): 0900 -SLP Stop Time (ACUTE ONLY): [id number] SLP Time Calculation (min) (ACUTE ONLY): 24 min Past Medical History: Past Medical History: Diagnosis Date  Atrial fibrillation ([place])   CHF (congestive heart failure) ([place])   Hepatitis C   Hypertension   Stroke [facility])   Visit for monitoring Glenn [name] therapy 03/26/2017 Past Surgical History: Past Surgical History: Procedure Laterality Date  CARDIAC DEFIBRILLATOR PLACEMENT  2015  CARDIOVERSION N/A 10/10/2016  Procedure: CARDIOVERSION;  Surgeon: Dorothy Spark, MD;  Location: [place];  Service: Cardiovascular;  Laterality: N/A;  CARDIOVERSION N/A 03/27/2017  Procedure: CARDIOVERSION;  Surgeon: Jerline Pain, MD;  Location: [place];  Service: Cardiovascular;  Laterality: N/A;  CARDIOVERSION N/A 10/29/2018  Procedure: CARDIOVERSION;  Surgeon: Sanda Klein, MD;  Location: [place];  Service: Cardiovascular;  Laterality: N/A;  CARDIOVERSION N/A 11/05/2018  Procedure: CARDIOVERSION;  Surgeon: Nahser, Wonda Cheng, MD;  Location: [place];  Service: Cardiovascular;  Laterality: N/A;  EYE SURGERY Left 1990   IR PERCUTANEOUS ART THROMBECTOMY/INFUSION INTRACRANIAL INC DIAG ANGIO  09/05/2016  IR RADIOLOGIST EVAL & MGMT  10/03/2016  RADIOLOGY WITH ANESTHESIA N/A 09/05/2016  Procedure: RADIOLOGY WITH ANESTHESIA;  Surgeon: Luanne Bras, MD;  Location: [place];  Service: Radiology;  Laterality: N/A;  RIGHT/LEFT HEART CATH AND CORONARY ANGIOGRAPHY N/A 11/03/2018  Procedure: RIGHT/LEFT HEART CATH AND CORONARY ANGIOGRAPHY;  Surgeon: Lorretta Harp, MD;  Location: [place]  CV LAB;  Service: Cardiovascular;  Laterality: N/A; HPI: SLAYTON MCLARNEY is an 68 y.o. male with atrial fibrillation on Glenn [name], CHF, hepatitis C, HTN, recent cardiac defibrillator placement and prior stroke admitted with L side weakness, slurred speech, and altered mental status. CT showed large subacute hermorrhagic infarct R MCA. CXR was unremarkable. BSE 09/07/16 revealed functional swallow and regular/thin liquids rec'd. Pt was given the [place] and RN reported coughing and choking, currently NPO.  Subjective: "want some water" Assessment / Plan / Recommendation CHL IP CLINICAL IMPRESSIONS 11/12/2018 Clinical Impression MBS revealed mild-moderate oropharyngeal dysphagia characterized by anterior spillage 2/2 L weakness and reduced epiglottic inversion that resulted in flash penetration with no instances of aspiration. Although epiglottis inversion is absent or reduced, there was enough constriction via pharyngeal peristalsis to prevent aspiration across all consistencies. Thin liquids were swallowed quickly with appropriate timing compared to nectar thick which resulted in a delayed, uncoordinated swallow and greater pharyngeal residue. Mild-mod prolonged mastication and transit with regular solids and significant barium in left lateral sulci. He was receptive to chewing on his stronger R side and scanning for pocketing on his weak side. Pt was unable to swallow pill given thins or puree and expectorated. Recommend Dysphagia 2 diet with thin  liquids, straws allowed, meds crushed in puree and cueing for compensatory strategies of R side chewing and lingual sweeping for L pocketing.    SLP Visit Diagnosis Dysphagia, oropharyngeal phase (R13.12) Attention and concentration deficit following -- Frontal lobe and executive function deficit following -- Impact on safety and function Mild aspiration risk   CHL IP TREATMENT RECOMMENDATION 11/12/2018 Treatment Recommendations Therapy as outlined in treatment plan below   Prognosis 11/12/2018 Prognosis for Safe Diet Advancement Good Barriers to Reach Goals -- Barriers/Prognosis Comment -- CHL IP DIET RECOMMENDATION 11/12/2018 SLP Diet Recommendations Dysphagia 2 (Fine chop) solids;Thin liquid Liquid Administration via Cup;Straw Medication Administration Crushed with puree Compensations Monitor for anterior loss;Lingual sweep for clearance of pocketing Postural Changes Seated upright at 90 degrees   CHL IP OTHER RECOMMENDATIONS 11/12/2018 Recommended Consults -- Oral Care Recommendations Oral care BID Other Recommendations --   CHL IP FOLLOW UP RECOMMENDATIONS 11/12/2018 Follow up Recommendations Inpatient Rehab   CHL IP FREQUENCY AND DURATION 11/12/2018 Speech Therapy Frequency (ACUTE ONLY) min 2x/week Treatment Duration 2 weeks      CHL IP ORAL PHASE 11/12/2018 Oral Phase Impaired Oral - Pudding Teaspoon -- Oral - Pudding Cup -- Oral - Honey Teaspoon -- Oral - Honey Cup -- Oral - Nectar Teaspoon -- Oral - Nectar Cup Left anterior bolus loss Oral - Nectar Straw -- Oral - Thin Teaspoon -- Oral - Thin Cup Left anterior bolus loss Oral - Thin Straw Left anterior bolus loss Oral - Puree -- Oral - Mech Soft -- Oral - Regular Left anterior bolus loss;Delayed oral transit;Weak lingual manipulation Oral - Multi-Consistency -- Oral - Pill Weak lingual manipulation;Holding of bolus Oral Phase - Comment --  CHL IP PHARYNGEAL PHASE 11/12/2018 Pharyngeal Phase Impaired Pharyngeal- Pudding Teaspoon -- Pharyngeal -- Pharyngeal- Pudding  Cup -- Pharyngeal -- Pharyngeal- Honey Teaspoon -- Pharyngeal -- Pharyngeal- Honey Cup -- Pharyngeal -- Pharyngeal- Nectar Teaspoon -- Pharyngeal -- Pharyngeal- Nectar Cup Pharyngeal residue - valleculae;Pharyngeal residue - pyriform;Reduced epiglottic inversion Pharyngeal -- Pharyngeal- Nectar Straw -- Pharyngeal -- Pharyngeal- Thin Teaspoon -- Pharyngeal -- Pharyngeal- Thin Cup Reduced epiglottic inversion;Penetration/Aspiration during swallow Pharyngeal Material enters airway, remains ABOVE vocal cords then ejected out Pharyngeal- Thin Straw Reduced epiglottic inversion Pharyngeal -- Pharyngeal- Puree -- Pharyngeal --  Pharyngeal- Mechanical Soft -- Pharyngeal -- Pharyngeal- Regular Reduced epiglottic inversion;Pharyngeal residue - pyriform;Pharyngeal residue - valleculae Pharyngeal -- Pharyngeal- Multi-consistency -- Pharyngeal -- Pharyngeal- Pill NT Pharyngeal -- Pharyngeal Comment --  CHL IP CERVICAL ESOPHAGEAL PHASE 11/12/2018 Cervical Esophageal Phase WFL Pudding Teaspoon -- Pudding Cup -- Honey Teaspoon -- Honey Cup -- Nectar Teaspoon -- Nectar Cup -- Nectar Straw -- Thin Teaspoon -- Thin Cup -- Thin Straw -- Puree -- Mechanical Soft -- Regular -- Multi-consistency -- Pill -- Cervical Esophageal Comment -- [place] Siren 11/12/2018, 4:15 PM [name] Colvin Caroli.Ed Risk analyst [phone] Office [phone]                  Medical Problem List and Plan: 1.  Left-sided weakness with facial droop and dysphagia secondary to large right MCA infarction with hemorrhagic conversion, embolic secondary to known A. fib on Glenn [name] as well as history of left MCA infarction 2018 status post thrombectomy for left M2 occlusion.  Anticoagulation on hold due to hemorrhage  Admit to CIR 2.  Antithrombotics: -DVT/anticoagulation: Subcutaneous heparin initiated 11/11/2018  Monitor for bleeding  -antiplatelet therapy: N/A 3. Pain Management: Neurontin 300 mg 3 times daily,tramadol as  needed 4. Mood: Valium 5 mg twice daily as needed anxiety  -antipsychotic agents: N/A 5. Neuropsych: This patient is not fully capable of making decisions on his own behalf. 6. Skin/Wound Care: Routine skin checks 7. Fluids/Electrolytes/Nutrition: Routine I/Os  CMP ordered for tomorrow a.m. 8.  Atrial fibrillation status post ICD.  Follow cardiology services.  Patient with recent cardioversion.  Cardiac rate controlled 9.  Acute on chronic diastolic congestive heart failure.  Lasix 40 mg daily.  Monitor for any signs of fluid overload  Daily weights 10.  Nonischemic cardiomyopathy.  Ejection fraction 15 to 20%.  See #9 11.  Hypertension.  Coreg 25 mg twice daily, Aldactone 12.5 mg daily, Entresto 24-26 mg twice daily  Monitor with increased mobility 12.  Hyperlipidemia.  Lipitor 13.  Post stroke dysphagia.  Dysphasia #2 thin liquids.  Follow-up speech therapy 14.  AKI.  Creatinine 1.23-1.54  BMP ordered for tomorrow a.m. 15.  History of tobacco/marijuana abuse as well as history of remote cocaine use.  Provide counseling 16.  Medical noncompliance.  Counseling 17.  Chronic hep C.  Follow-up outpatient   Cathlyn Parsons, PA-C 11/13/2018   I have personally performed a face to face diagnostic evaluation, including, but not limited to relevant history and physical exam findings, of this patient and developed relevant assessment and plan.  Additionally, I have reviewed and concur with the physician assistant's documentation above.  Delice Lesch, MD, ABPMR  The patient's status has not changed. The original post admission physician evaluation remains appropriate, and any changes from the pre-admission screening or documentation from the acute chart are noted above.   Delice Lesch, MD, ABPMR

## 2018-11-13 NOTE — Progress Notes (Signed)
Physical Therapy Treatment Patient Details Name: Glenn [name] MRN: [id number] DOB: 05-09-1950 Today's Date: 11/13/2018    History of Present Illness 68 yo male presenting with left sided weakness and left neglect. CT showing large territory subacute hemorrhagic infarct right MCA territory, and ischemic stroke which measures approximately 2 x 3 cm centered in the head of the caudate and putamen on the right. PMH including atrial fibrillation on Eliquis, CHF, hepatitis C, HTN, recent cardiac defibrillator placement and prior stroke.    PT Comments    Patient seen for mobility progression. Pt agreeable to participate in therapy but very drowsy at time of session. Pt continues to present with L side weakness and inattention and requires mod A +2 for functional transfer training. Pt c/o R hip pain and demonstrates heavy L lateral bias in standing. Continue to progress as tolerated and current plan remains appropriate.      Follow Up Recommendations  CIR     Equipment Recommendations  Other (comment)(TBA)    Recommendations for Other Services Rehab consult     Precautions / Restrictions Precautions Precautions: Fall Precaution Comments: L inattention Restrictions Weight Bearing Restrictions: No    Mobility  Bed Mobility Overal bed mobility: Needs Assistance Bed Mobility: Supine to Sit     Supine to sit: Mod assist     General bed mobility comments: assist to bring bilat LE to EOB and to elevate trunk into sitting; mulitmodal cues for sequencing   Transfers Overall transfer level: Needs assistance Equipment used: Rolling walker (2 wheeled) Transfers: Sit to/from [facility] Sit to Stand: +2 safety/equipment;Mod assist Stand pivot transfers: Mod assist;+2 physical assistance       General transfer comment: cues for safe hand placement and assistance for L hand placement on RW; assistance required to power up into standing and for weight shifting and  guiding RW when pivoting bed to recliner   Ambulation/Gait                 Stairs             Wheelchair Mobility    Modified Rankin (Stroke Patients Only) Modified Rankin (Stroke Patients Only) Pre-Morbid Rankin Score: No symptoms Modified Rankin: Moderately severe disability     Balance Overall balance assessment: Needs assistance Sitting-balance support: No upper extremity supported;Feet supported Sitting balance-Leahy Scale: Fair(to poor)   Postural control: Left lateral lean Standing balance support: Bilateral upper extremity supported;During functional activity Standing balance-Leahy Scale: Poor Standing balance comment: Reliant on physical A and UE support                            Cognition Arousal/Alertness: (drowsy) Behavior During Therapy: WFL for tasks assessed/performed Overall Cognitive Status: Impaired/Different from baseline Area of Impairment: Attention;Memory;Following commands;Problem solving;Safety/judgement                   Current Attention Level: Sustained Memory: Decreased short-term memory Following Commands: Follows one step commands inconsistently;Follows one step commands with increased time Safety/Judgement: Decreased awareness of safety;Decreased awareness of deficits   Problem Solving: Slow processing;Difficulty sequencing;Requires verbal cues;Requires tactile cues General Comments: pt falling asleep often during session but easily aroused; pt asked if he slept well last night and he said "like a baby" and when asked if he is tired he states "no" while falling asleep      Exercises      General Comments        Pertinent Vitals/Pain  Pain Assessment: Faces Faces Pain Scale: Hurts little more Pain Location: R hip Pain Descriptors / Indicators: Aching;Guarding;Sore Pain Intervention(s): Limited activity within patient's tolerance;Monitored during session;Repositioned    Home Living                       Prior Function            PT Goals (current goals can now be found in the care plan section) Progress towards PT goals: Progressing toward goals    Frequency    Min 4X/week      PT Plan Current plan remains appropriate    Co-evaluation              AM-PAC PT "6 Clicks" Mobility   Outcome Measure  Help needed turning from your back to your side while in a flat bed without using bedrails?: A Little Help needed moving from lying on your back to sitting on the side of a flat bed without using bedrails?: A Little Help needed moving to and from a bed to a chair (including a wheelchair)?: A Little Help needed standing up from a chair using your arms (e.g., wheelchair or bedside chair)?: A Little Help needed to walk in hospital room?: A Lot Help needed climbing 3-5 steps with a railing? : Total 6 Click Score: 15    End of Session Equipment Utilized During Treatment: Gait belt Activity Tolerance: Patient tolerated treatment well Patient left: in chair;with call bell/phone within reach;with chair alarm set Nurse Communication: Mobility status PT Visit Diagnosis: Other abnormalities of gait and mobility (R26.89);Hemiplegia and hemiparesis Hemiplegia - Right/Left: Left Hemiplegia - dominant/non-dominant: Non-dominant Hemiplegia - caused by: Cerebral infarction;Nontraumatic intracerebral hemorrhage     Time: [id number] PT Time Calculation (min) (ACUTE ONLY): 22 min  Charges:  $Gait Training: 8-22 mins                     Glenn [name], PTA Acute Rehabilitation Services Pager: [phone] Office: [phone]     Darliss Cheney 11/13/2018, 1:58 PM

## 2018-11-13 NOTE — Progress Notes (Addendum)
Inpatient Rehabilitation Admissions Coordinator  I have insurance approval and bed to admit pt to today. Pt in agreement. I have contacted his son, [name] as well as his roommate , Jackelyn Poling of plan to admit today to CIR. I will make the arrangements to admit. I have notified Burnetta Sabin, [facility] of admission.  Danne Baxter, RN, MSN Rehab Admissions Coordinator [phone] 11/13/2018 12:57 PM

## 2018-11-13 NOTE — Care Management Important Message (Signed)
Important Message  Patient Details  Name: JONIS DEANE MRN: [id number] Date of Birth: 09-13-1950   Medicare Important Message Given:  Yes     [name] 11/13/2018, 3:39 PM

## 2018-11-13 NOTE — Discharge Summary (Addendum)
Stroke Discharge Summary  Patient ID: Glenn [name]   MRN: [id number]      DOB: 02-09-1951  Date of Admission: 11/10/2018 Date of Discharge: 11/13/2018  Attending Physician:  Rosalin Hawking, MD, Stroke MD Consultant(s):  Lbcardiology, Rounding, MD  Patient's PCP:  [name], MD  Discharge Diagnoses:  Principal Problem:   Stroke (cerebrum) ([place]) Lg L MCA infarct w/ hemorrhagic conversion, embolic d/t AF Active Problems:   CHF (congestive heart failure) ([place])   HTN (hypertension)   Chronic Hepatitis C    Atrial fibrillation with RVR ([place])   Ischemic cardiomyopathy   ICD (implantable cardioverter-defibrillator) in place   Tobacco abuse   Acute kidney injury ([place])   Cerebral edema ([place])   Hyperlipidemia LDL goal <70   Marijuana user  Past Medical History:  Diagnosis Date  . Atrial fibrillation ([place])   . CHF (congestive heart failure) ([place])   . Hepatitis C   . Hypertension   . Stroke ([place])   . Visit for monitoring Tikosyn therapy 03/26/2017   Past Surgical History:  Procedure Laterality Date  . CARDIAC DEFIBRILLATOR PLACEMENT  2015  . CARDIOVERSION N/A 10/10/2016   Procedure: CARDIOVERSION;  Surgeon: Dorothy Spark, MD;  Location: [place];  Service: Cardiovascular;  Laterality: N/A;  . CARDIOVERSION N/A 03/27/2017   Procedure: CARDIOVERSION;  Surgeon: Jerline Pain, MD;  Location: [facility] ENDOSCOPY;  Service: Cardiovascular;  Laterality: N/A;  . CARDIOVERSION N/A 10/29/2018   Procedure: CARDIOVERSION;  Surgeon: Sanda Klein, MD;  Location: [place] ENDOSCOPY;  Service: Cardiovascular;  Laterality: N/A;  . CARDIOVERSION N/A 11/05/2018   Procedure: CARDIOVERSION;  Surgeon: Acie Fredrickson Wonda Cheng, MD;  Location: [place];  Service: Cardiovascular;  Laterality: N/A;  . EYE SURGERY Left 1990  . IR PERCUTANEOUS ART THROMBECTOMY/INFUSION INTRACRANIAL INC DIAG ANGIO  09/05/2016  . IR RADIOLOGIST EVAL & MGMT  10/03/2016  . RADIOLOGY WITH ANESTHESIA N/A 09/05/2016   Procedure:  RADIOLOGY WITH ANESTHESIA;  Surgeon: Luanne Bras, MD;  Location: [place];  Service: Radiology;  Laterality: N/A;  . RIGHT/LEFT HEART CATH AND CORONARY ANGIOGRAPHY N/A 11/03/2018   Procedure: RIGHT/LEFT HEART CATH AND CORONARY ANGIOGRAPHY;  Surgeon: Lorretta Harp, MD;  Location: [place] CV LAB;  Service: Cardiovascular;  Laterality: N/A;    Medications to be continued on Rehab Allergies as of 11/13/2018      Reactions   Benadryl [diphenhydramine] Palpitations      Medication List    STOP taking these medications   apixaban 5 MG Tabs tablet Commonly known as: Eliquis   aspirin EC 325 MG tablet   dofetilide 250 MCG capsule Commonly known as: TIKOSYN   Mavyret 100-40 MG Tabs Generic drug: Glecaprevir-Pibrentasvir     TAKE these medications   albuterol 108 (90 Base) MCG/ACT inhaler Commonly known as: VENTOLIN HFA Inhale 2 puffs into the lungs every 4 (four) hours as needed for shortness of breath.   atorvastatin 20 MG tablet Commonly known as: LIPITOR Take 1 tablet (20 mg total) by mouth daily at 6 PM.   B-12 500 MCG Tabs Take 500 mcg by mouth daily.   carvedilol 25 MG tablet Commonly known as: COREG Take 1 tablet (25 mg total) by mouth 2 (two) times daily with a meal.   diazepam 5 MG tablet Commonly known as: VALIUM Take 1 tablet (5 mg total) by mouth 2 (two) times daily as needed for anxiety. What changed: See the new instructions.   furosemide 40 MG tablet Commonly known as: LASIX  Take 1 tablet (40 mg total) by mouth daily.   gabapentin 300 MG capsule Commonly known as: NEURONTIN TAKE 1 CAPSULE(300 MG) BY MOUTH THREE TIMES DAILY What changed: See the new instructions.   heparin 5000 UNIT/ML injection Inject 1 mL (5,000 Units total) into the skin every 8 (eight) hours.   potassium chloride SA 20 MEQ tablet Commonly known as: K-DUR Take 1 tablet (20 mEq total) by mouth daily.   sacubitril-valsartan 24-26 MG Commonly known as: ENTRESTO Take 1  tablet by mouth 2 (two) times daily.   senna-docusate 8.6-50 MG tablet Commonly known as: Senokot-S Take 1 tablet by mouth 2 (two) times daily.   sodium chloride 0.9 % infusion Inject 40 mLs into the vein continuous.   sodium chloride flush 0.9 % Soln Commonly known as: NS Inject 3 mLs into the vein once for 1 dose.   spironolactone 25 MG tablet Commonly known as: ALDACTONE Take 0.5 tablets (12.5 mg total) by mouth daily.   vitamin C 500 MG tablet Commonly known as: ASCORBIC ACID Take 500 mg by mouth daily.       LABORATORY STUDIES CBC    Component Value Date/Time   WBC 8.8 11/13/2018 0402   RBC 4.44 11/13/2018 0402   HGB 14.4 11/13/2018 0402   HGB 11.7 (L) 10/08/2016 1144   HCT 42.2 11/13/2018 0402   HCT 34.2 (L) 10/08/2016 1144   PLT 132 (L) 11/13/2018 0402   PLT 171 10/08/2016 1144   MCV 95.0 11/13/2018 0402   MCV 94 10/08/2016 1144   MCH 32.4 11/13/2018 0402   MCHC 34.1 11/13/2018 0402   RDW 12.8 11/13/2018 0402   RDW 12.5 10/08/2016 1144   LYMPHSABS 1.5 11/10/2018 1753   MONOABS 0.8 11/10/2018 1753   EOSABS 0.0 11/10/2018 1753   BASOSABS 0.0 11/10/2018 1753   CMP    Component Value Date/Time   NA 141 11/13/2018 0402   NA 142 04/22/2017 1123   K 4.1 11/13/2018 0402   CL 108 11/13/2018 0402   CO2 21 (L) 11/13/2018 0402   GLUCOSE 102 (H) 11/13/2018 0402   BUN 13 11/13/2018 0402   BUN 8 04/22/2017 1123   CREATININE 1.38 (H) 11/13/2018 0402   CREATININE 1.40 (H) 12/03/2017 1407   CALCIUM 8.8 (L) 11/13/2018 0402   PROT 8.2 (H) 11/10/2018 1753   ALBUMIN 4.4 11/10/2018 1753   AST 26 11/10/2018 1753   ALT 21 11/10/2018 1753   ALT 45 05/21/2017 1520   ALKPHOS 94 11/10/2018 1753   BILITOT 0.9 11/10/2018 1753   GFRNONAA 53 (L) 11/13/2018 0402   GFRNONAA 69 05/21/2017 1510   GFRAA >60 11/13/2018 0402   GFRAA 80 05/21/2017 1510   COAGS Lab Results  Component Value Date   INR 1.3 (H) 11/11/2018   INR 1.2 11/11/2018   INR 1.0 11/10/2018   Lipid  Panel    Component Value Date/Time   CHOL 135 11/11/2018 0329   TRIG 75 11/11/2018 0329   HDL 24 (L) 11/11/2018 0329   CHOLHDL 5.6 11/11/2018 0329   VLDL 15 11/11/2018 0329   LDLCALC 96 11/11/2018 0329   HgbA1C  Lab Results  Component Value Date   HGBA1C 4.9 11/11/2018   Urinalysis No results found for: COLORURINE, APPEARANCEUR, LABSPEC, PHURINE, GLUCOSEU, HGBUR, BILIRUBINUR, KETONESUR, PROTEINUR, UROBILINOGEN, NITRITE, LEUKOCYTESUR Urine Drug Screen     Component Value Date/Time   LABOPIA NONE DETECTED 10/30/2018 0146   COCAINSCRNUR NONE DETECTED 10/30/2018 0146   LABBENZ POSITIVE (A) 10/30/2018 0146   AMPHETMU  NONE DETECTED 10/30/2018 0146   THCU NONE DETECTED 10/30/2018 0146   LABBARB NONE DETECTED 10/30/2018 0146    Alcohol Level    Component Value Date/Time   ETH 8 (H) 09/05/2016 2020     SIGNIFICANT DIAGNOSTIC STUDIES Ct Angio Head W Or Wo Contrast  Result Date: 11/10/2018 CLINICAL DATA:  Stroke with left-sided weakness and slurred speech EXAM: CT ANGIOGRAPHY HEAD AND NECK TECHNIQUE: Multidetector CT imaging of the head and neck was performed using the standard protocol during bolus administration of intravenous contrast. Multiplanar CT image reconstructions and MIPs were obtained to evaluate the vascular anatomy. Carotid stenosis measurements (when applicable) are obtained utilizing NASCET criteria, using the distal internal carotid diameter as the denominator. CONTRAST:  39mL OMNIPAQUE IOHEXOL 350 MG/ML SOLN COMPARISON:  CT head 11/10/2018 FINDINGS: CTA NECK FINDINGS Aortic arch: Standard branching. Imaged portion shows no evidence of aneurysm or dissection. No significant stenosis of the major arch vessel origins. Right carotid system: Calcified and noncalcified plaque right carotid bulb narrowing the lumen by approximately 35-40% diameter stenosis. Left carotid system: Mild atherosclerotic disease left carotid bifurcation without significant stenosis. Vertebral  arteries: Both vertebral arteries patent to the basilar without significant stenosis. Skeleton: Cervical spondylosis without acute abnormality. Other neck: Negative Upper chest: Negative Review of the MIP images confirms the above findings CTA HEAD FINDINGS Anterior circulation: Heavily calcified right cavernous carotid with moderate stenosis. Irregular subtotal occlusion of the right M1 segment with good distal flow. The area of stenosis has irregularity most compatible with thrombus. Left middle cerebral artery widely patent. Anterior cerebral arteries patent bilaterally without significant stenosis. Posterior circulation: Both vertebral arteries patent to the basilar. PICA patent bilaterally. Atherosclerotic irregularity and mild to moderate stenosis in the mid and distal basilar artery. Superior cerebellar and posterior cerebral arteries patent bilaterally. Mild atherosclerotic disease in the posterior cerebral arteries bilaterally. Venous sinuses: Patent Anatomic variants: None Review of the MIP images confirms the above findings IMPRESSION: 1. Subtotal occlusion right M1 segment with irregularity most consistent with thrombus. Good distal flow. Associated hemorrhagic infarct right MCA territory noted on earlier CT head. Moderate atherosclerotic stenosis right cavernous carotid. 35-40% diameter stenosis right internal carotid artery stenosis at the origin. 2. Left carotid widely patent with mild atherosclerotic disease at the bifurcation. Both vertebral arteries widely patent. 3. Mild moderate basilar stenosis. Mild atherosclerotic disease posterior cerebral artery bilaterally. 4. These results were called by telephone at the time of interpretation on 11/10/2018 at 6:45 pm to provider ERIC [facility] , who verbally acknowledged these results. 5. Electronically Signed   By: Franchot Gallo M.D.   On: 11/10/2018 18:56   Ct Head Wo Contrast  Result Date: 11/11/2018 CLINICAL DATA:  Intracranial hemorrhage, known,  follow-up. Additional history provided: Patient reports frontal headache today. EXAM: CT HEAD WITHOUT CONTRAST TECHNIQUE: Contiguous axial images were obtained from the base of the skull through the vertex without intravenous contrast. COMPARISON:  CT angiogram head/neck 11/10/2018, noncontrast head CT 11/10/2018 FINDINGS: Brain: Again demonstrated is a large region of cortical/subcortical edema within the right frontal lobe with additional involvement of the right insula and extending to the right basal ganglia. Hemorrhage centered within the right caudate head and extending inferiorly into the right basal ganglia has not significantly changed in size or appearance, measuring 2.0 x 3.4 cm (TV x CC) on the current examination (previously 2 x 3.3 cm). Associated mass effect with mild right to left subfalcine herniation, partial effacement of the right lateral ventricle frontal horn and 2 mm leftward  midline shift at the level of the septum pellucidum, similar to prior examination. Also similar to prior examination there is small volume intraventricular hemorrhage within the atrium of the right lateral ventricle. Vascular: Persistent hyperdensity within the M1 right middle cerebral artery. Subtotal occlusion of the M1 right MCA was noted on CT angiogram head 11/10/2018. Atherosclerotic calcification of the carotid artery siphons. Skull: No calvarial fracture. Sinuses/Orbits: Mild scattered paranasal sinus mucosal thickening. No significant mastoid effusion Visualized orbits demonstrate no acute abnormality. IMPRESSION: 1. No significant interval change in large subacute right MCA/ACA vascular territory hemorrhagic infarction as compared to head CT 11/10/2018. An area of hemorrhage centered within the right caudate head and extending inferiorly within the right basal ganglia measures 2 x 3.4 cm (previously 2 x 3.3). Small volume hemorrhage extending within the atrium of the right lateral ventricle has not significantly  changed. 2. Mass effect with partial effacement of the right lateral ventricle frontal horn, right to left subfalcine herniation and 2 mm leftward midline shift, also similar to prior exam. Electronically Signed   By: Kellie Simmering   On: 11/11/2018 09:38   Ct Angio Neck W Or Wo Contrast  Result Date: 11/10/2018 CLINICAL DATA:  Stroke with left-sided weakness and slurred speech EXAM: CT ANGIOGRAPHY HEAD AND NECK TECHNIQUE: Multidetector CT imaging of the head and neck was performed using the standard protocol during bolus administration of intravenous contrast. Multiplanar CT image reconstructions and MIPs were obtained to evaluate the vascular anatomy. Carotid stenosis measurements (when applicable) are obtained utilizing NASCET criteria, using the distal internal carotid diameter as the denominator. CONTRAST:  42mL OMNIPAQUE IOHEXOL 350 MG/ML SOLN COMPARISON:  CT head 11/10/2018 FINDINGS: CTA NECK FINDINGS Aortic arch: Standard branching. Imaged portion shows no evidence of aneurysm or dissection. No significant stenosis of the major arch vessel origins. Right carotid system: Calcified and noncalcified plaque right carotid bulb narrowing the lumen by approximately 35-40% diameter stenosis. Left carotid system: Mild atherosclerotic disease left carotid bifurcation without significant stenosis. Vertebral arteries: Both vertebral arteries patent to the basilar without significant stenosis. Skeleton: Cervical spondylosis without acute abnormality. Other neck: Negative Upper chest: Negative Review of the MIP images confirms the above findings CTA HEAD FINDINGS Anterior circulation: Heavily calcified right cavernous carotid with moderate stenosis. Irregular subtotal occlusion of the right M1 segment with good distal flow. The area of stenosis has irregularity most compatible with thrombus. Left middle cerebral artery widely patent. Anterior cerebral arteries patent bilaterally without significant stenosis. Posterior  circulation: Both vertebral arteries patent to the basilar. PICA patent bilaterally. Atherosclerotic irregularity and mild to moderate stenosis in the mid and distal basilar artery. Superior cerebellar and posterior cerebral arteries patent bilaterally. Mild atherosclerotic disease in the posterior cerebral arteries bilaterally. Venous sinuses: Patent Anatomic variants: None Review of the MIP images confirms the above findings IMPRESSION: 1. Subtotal occlusion right M1 segment with irregularity most consistent with thrombus. Good distal flow. Associated hemorrhagic infarct right MCA territory noted on earlier CT head. Moderate atherosclerotic stenosis right cavernous carotid. 35-40% diameter stenosis right internal carotid artery stenosis at the origin. 2. Left carotid widely patent with mild atherosclerotic disease at the bifurcation. Both vertebral arteries widely patent. 3. Mild moderate basilar stenosis. Mild atherosclerotic disease posterior cerebral artery bilaterally. 4. These results were called by telephone at the time of interpretation on 11/10/2018 at 6:45 pm to provider ERIC [facility] , who verbally acknowledged these results. 5. Electronically Signed   By: Franchot Gallo M.D.   On: 11/10/2018 18:56  [name] Contrast  Result Date: 11/12/2018 CLINICAL DATA:  Follow-up stroke.  Intracranial hemorrhage. EXAM: MRI HEAD WITHOUT CONTRAST TECHNIQUE: Multiplanar, multiecho pulse sequences of the brain and surrounding structures were obtained without intravenous contrast. COMPARISON:  Head CT 11/11/2018 and 11/10/2018 FINDINGS: Brain: Diffusion imaging redemonstrates acute infarction in the anterior portion of the right middle cerebral artery territory and possibly some of the anterior cerebral artery territory, with confluent involvement of the right anterior frontal lobe, frontal operculum region, insula, basal ganglia and frontoparietal junction region. Hemorrhage into the right basal ganglia appears  similar, maximal dimension 2 cm. Layering intraventricular blood in the occipital horn of the right lateral ventricle as seen previously. No acute infarction in the left hemisphere or affecting the brainstem or cerebellum. Swelling in the region of infarction, with right-to-left shift measuring up to 8 mm. No ventricular trapping. No extra-axial collection. Vascular: Major vessels at the base of the brain show flow. Skull and upper cervical spine: Negative Sinuses/Orbits: Clear/normal Other: None IMPRESSION: No evidence of extension of the right brain infarction. Acute infarction affecting the right frontal lobe, frontal operculum, insula and frontoparietal junction region, with involvement of the right basal ganglia. 2 cm hematoma in the right basal ganglia has not enlarged. Intraventricular blood has not increased. Swelling and mass effect, right-to-left shift measuring up to 8 mm, unchanged when measured in the same location on yesterday's CT. Electronically Signed   By: Nelson Chimes M.D.   On: 11/12/2018 16:12   Dg Chest Portable 1 View  Result Date: 11/10/2018 CLINICAL DATA:  Stroke EXAM: PORTABLE CHEST 1 VIEW COMPARISON:  10/29/2018 FINDINGS: Left AICD remains in place, unchanged. Cardiomegaly. No confluent opacities, effusions or edema. No acute bony abnormality. IMPRESSION: Cardiomegaly.  No active disease. Electronically Signed   By: Rolm Baptise M.D.   On: 11/10/2018 19:58   Dg Chest Port 1 View  Result Date: 10/29/2018 CLINICAL DATA:  Shortness of breath EXAM: PORTABLE CHEST 1 VIEW COMPARISON:  February 14, 2017 FINDINGS: There is cardiomegaly. A left-sided pacemaker seen with the lead tip in the right atrium. Mildly increased interstitial markings seen within the lower lungs. No large airspace consolidation. IMPRESSION: Mild cardiomegaly. Mildly increased interstitial markings which could be due to atelectasis and/or mild interstitial edema. Electronically Signed   By: Prudencio Pair M.D.   On:  10/29/2018 21:47   Dg Swallowing Func-speech Pathology  Result Date: 11/12/2018 Objective Swallowing Evaluation: Type of Study: Bedside Swallow Evaluation  Patient Details Name: HYDE PERCLE MRN: [id number] Date of Birth: May 04, 1950 Today's Date: 11/12/2018 Time: SLP Start Time (ACUTE ONLY): 0900 -SLP Stop Time (ACUTE ONLY): 0924 SLP Time Calculation (min) (ACUTE ONLY): 24 min Past Medical History: Past Medical History: Diagnosis Date . Atrial fibrillation ([place])  . CHF (congestive heart failure) ([place])  . Hepatitis C  . Hypertension  . Stroke ([place])  . Visit for monitoring Tikosyn therapy 03/26/2017 Past Surgical History: Past Surgical History: Procedure Laterality Date . CARDIAC DEFIBRILLATOR PLACEMENT  2015 . CARDIOVERSION N/A 10/10/2016  Procedure: CARDIOVERSION;  Surgeon: Dorothy Spark, MD;  Location: [place];  Service: Cardiovascular;  Laterality: N/A; . CARDIOVERSION N/A 03/27/2017  Procedure: CARDIOVERSION;  Surgeon: Jerline Pain, MD;  Location: [facility] ENDOSCOPY;  Service: Cardiovascular;  Laterality: N/A; . CARDIOVERSION N/A 10/29/2018  Procedure: CARDIOVERSION;  Surgeon: Sanda Klein, MD;  Location: [place] ENDOSCOPY;  Service: Cardiovascular;  Laterality: N/A; . CARDIOVERSION N/A 11/05/2018  Procedure: CARDIOVERSION;  Surgeon: Acie Fredrickson Wonda Cheng, MD;  Location: [place];  Service: Cardiovascular;  Laterality: N/A; . EYE SURGERY Left 1990 . IR PERCUTANEOUS ART THROMBECTOMY/INFUSION INTRACRANIAL INC DIAG ANGIO  09/05/2016 . IR RADIOLOGIST EVAL & MGMT  10/03/2016 . RADIOLOGY WITH ANESTHESIA N/A 09/05/2016  Procedure: RADIOLOGY WITH ANESTHESIA;  Surgeon: Luanne Bras, MD;  Location: [place];  Service: Radiology;  Laterality: N/A; . RIGHT/LEFT HEART CATH AND CORONARY ANGIOGRAPHY N/A 11/03/2018  Procedure: RIGHT/LEFT HEART CATH AND CORONARY ANGIOGRAPHY;  Surgeon: Lorretta Harp, MD;  Location: [place] CV LAB;  Service: Cardiovascular;  Laterality: N/A; HPI: SWANSON KOSTOFF is an 68 y.o. male  with atrial fibrillation on Eliquis, CHF, hepatitis C, HTN, recent cardiac defibrillator placement and prior stroke admitted with L side weakness, slurred speech, and altered mental status. CT showed large subacute hermorrhagic infarct R MCA. CXR was unremarkable. BSE 09/07/16 revealed functional swallow and regular/thin liquids rec'd. Pt was given the [place] and RN reported coughing and choking, currently NPO.  Subjective: "want some water" Assessment / Plan / Recommendation CHL IP CLINICAL IMPRESSIONS 11/12/2018 Clinical Impression MBS revealed mild-moderate oropharyngeal dysphagia characterized by anterior spillage 2/2 L weakness and reduced epiglottic inversion that resulted in flash penetration with no instances of aspiration. Although epiglottis inversion is absent or reduced, there was enough constriction via pharyngeal peristalsis to prevent aspiration across all consistencies. Thin liquids were swallowed quickly with appropriate timing compared to nectar thick which resulted in a delayed, uncoordinated swallow and greater pharyngeal residue. Mild-mod prolonged mastication and transit with regular solids and significant barium in left lateral sulci. He was receptive to chewing on his stronger R side and scanning for pocketing on his weak side. Pt was unable to swallow pill given thins or puree and expectorated. Recommend Dysphagia 2 diet with thin liquids, straws allowed, meds crushed in puree and cueing for compensatory strategies of R side chewing and lingual sweeping for L pocketing.    SLP Visit Diagnosis Dysphagia, oropharyngeal phase (R13.12) Attention and concentration deficit following -- Frontal lobe and executive function deficit following -- Impact on safety and function Mild aspiration risk   CHL IP TREATMENT RECOMMENDATION 11/12/2018 Treatment Recommendations Therapy as outlined in treatment plan below   Prognosis 11/12/2018 Prognosis for Safe Diet Advancement Good Barriers to Reach Goals --  Barriers/Prognosis Comment -- CHL IP DIET RECOMMENDATION 11/12/2018 SLP Diet Recommendations Dysphagia 2 (Fine chop) solids;Thin liquid Liquid Administration via Cup;Straw Medication Administration Crushed with puree Compensations Monitor for anterior loss;Lingual sweep for clearance of pocketing Postural Changes Seated upright at 90 degrees   CHL IP OTHER RECOMMENDATIONS 11/12/2018 Recommended Consults -- Oral Care Recommendations Oral care BID Other Recommendations --   CHL IP FOLLOW UP RECOMMENDATIONS 11/12/2018 Follow up Recommendations Inpatient Rehab   CHL IP FREQUENCY AND DURATION 11/12/2018 Speech Therapy Frequency (ACUTE ONLY) min 2x/week Treatment Duration 2 weeks      CHL IP ORAL PHASE 11/12/2018 Oral Phase Impaired Oral - Pudding Teaspoon -- Oral - Pudding Cup -- Oral - Honey Teaspoon -- Oral - Honey Cup -- Oral - Nectar Teaspoon -- Oral - Nectar Cup Left anterior bolus loss Oral - Nectar Straw -- Oral - Thin Teaspoon -- Oral - Thin Cup Left anterior bolus loss Oral - Thin Straw Left anterior bolus loss Oral - Puree -- Oral - Mech Soft -- Oral - Regular Left anterior bolus loss;Delayed oral transit;Weak lingual manipulation Oral - Multi-Consistency -- Oral - Pill Weak lingual manipulation;Holding of bolus Oral Phase - Comment --  CHL IP PHARYNGEAL PHASE 11/12/2018 Pharyngeal Phase Impaired Pharyngeal- Pudding Teaspoon -- Pharyngeal -- Pharyngeal- Pudding  Cup -- Pharyngeal -- Pharyngeal- Honey Teaspoon -- Pharyngeal -- Pharyngeal- Honey Cup -- Pharyngeal -- Pharyngeal- Nectar Teaspoon -- Pharyngeal -- Pharyngeal- Nectar Cup Pharyngeal residue - valleculae;Pharyngeal residue - pyriform;Reduced epiglottic inversion Pharyngeal -- Pharyngeal- Nectar Straw -- Pharyngeal -- Pharyngeal- Thin Teaspoon -- Pharyngeal -- Pharyngeal- Thin Cup Reduced epiglottic inversion;Penetration/Aspiration during swallow Pharyngeal Material enters airway, remains ABOVE vocal cords then ejected out Pharyngeal- Thin Straw Reduced  epiglottic inversion Pharyngeal -- Pharyngeal- Puree -- Pharyngeal -- Pharyngeal- Mechanical Soft -- Pharyngeal -- Pharyngeal- Regular Reduced epiglottic inversion;Pharyngeal residue - pyriform;Pharyngeal residue - valleculae Pharyngeal -- Pharyngeal- Multi-consistency -- Pharyngeal -- Pharyngeal- Pill NT Pharyngeal -- Pharyngeal Comment --  CHL IP CERVICAL ESOPHAGEAL PHASE 11/12/2018 Cervical Esophageal Phase WFL Pudding Teaspoon -- Pudding Cup -- Honey Teaspoon -- Honey Cup -- Nectar Teaspoon -- Nectar Cup -- Nectar Straw -- Thin Teaspoon -- Thin Cup -- Thin Straw -- Puree -- Mechanical Soft -- Regular -- Multi-consistency -- Pill -- Cervical Esophageal Comment -- [place] Siren 11/12/2018, 4:15 PM [name] Litaker M.Ed Actor Pager [phone] Office [phone]              Ct Head Code Stroke Wo Contrast  Result Date: 11/10/2018 CLINICAL DATA:  Code stroke.  Left-sided weakness slurred speech EXAM: CT HEAD WITHOUT CONTRAST TECHNIQUE: Contiguous axial images were obtained from the base of the skull through the vertex without intravenous contrast. COMPARISON:  CT head 09/07/2016 FINDINGS: Brain: Large area of cortical edema in the right frontal lobe. There is also involvement of the insula and basal ganglia which show abnormal hypodensity. Hyperdensity in the right head of caudate and anterior basal ganglia compatible with recent hemorrhage. Mild subfalcine herniation of right frontal lobe due to edema. Ventricle size normal.  No other acute infarct or mass Vascular: Negative for hyperdense vessel Skull: Negative Sinuses/Orbits: Mild mucosal edema paranasal sinuses. Negative orbit. Other: None ASPECTS ([place] Stroke Program Early CT Score) - Ganglionic level infarction (caudate, lentiform nuclei, internal capsule, insula, M1-M3 cortex): 1 - Supraganglionic infarction (M4-M6 cortex): 2 Total score (0-10 with 10 being normal): 3 IMPRESSION: 1. Large territory subacute hemorrhagic  infarct right MCA territory. Area of hemorrhage measures approximately 2 x 3 cm centered in the head of the caudate and putamen on the right. The blood likely is subacute. Local mass-effect and mild subfalcine herniation due to edema. 2. ASPECTS is 3 3. Emergent results communicated with Dr. Cheral Marker via text page. Electronically Signed   By: Franchot Gallo M.D.   On: 11/10/2018 18:13    2D Echocardiogram 10/30/2018  1. The left ventricle has severely reduced systolic function, with an ejection fraction of 15-20%. The cavity size was moderately dilated. There is moderate concentric left ventricular hypertrophy. Left ventricular diastolic function could not be  evaluated secondary to atrial fibrillation. Left ventricular diffuse hypokinesis.  2. The right ventricle has moderately reduced systolic function. The cavity was moderately enlarged. There is no increase in right ventricular wall thickness. Right ventricular systolic pressure is moderately elevated with an estimated pressure of 45.5  mmHg.  3. Left atrial size was moderately dilated.  4. Right atrial size was severely dilated.  5. Tricuspid valve regurgitation is severe.  6. Mild thickening of the aortic valve. Aortic valve regurgitation is moderate by color flow Doppler.  7. The aorta is normal unless otherwise noted.   HISTORY OF PRESENT ILLNESS RAUSHAN GILLIS is an 68 y.o. male with atrial fibrillation on Eliquis, CHF, hepatitis C, HTN, recent cardiac defibrillator placement  and prior stroke, who was LKW at 8 PM yesterday night 11/09/2018, when his roommate noted him to have dropped a plate with his left hand. History obtained by EMS is sparse, but apparently 911 was not called until today 9/21, for left sided weakness. When EMS found the patient, he was not moving his left side, with left hemineglect and unawareness of his neurological deficit. He was not a tPA candidate as he was out of the time window. He was not an IR candidate due to  ASPECTS score of 3. He was admitted to the Neuro ICU for further evaluation and treatment.   HOSPITAL COURSE Mr. DINH SAWICKI is a 68 y.o. male with history of AF on Eliquis, CHF, hepatitis C, HTN, CM w/ low EF, ICD placement and prior stroke just discharged from hospital 4 days prior for AF w/ RVR s/p cardioversion, volume overload and AKI, now presenting with L sided weakness and neglect..   Stroke:  Large R MCA infarct with hemorrhagic conversion, embolic secondary to known AF on Eliquis s/p reversal w/ Kcentra.   Code Stroke CT head large subacute hemorrhagic infarct R MCA (caudate head, putamen). Local mass effect ad mild subfalcine herniation.  ASPECTS 3.    CTA head & neck subtotal occlusion R M1 c/w thrombus w/ hemorrhagic infarct R MCA. R ICA 35-40% at origin. Mild L ICA atherosclerosis. Mild to mod BA stenosis and B PCAs.  CT head no significant change in infarct. Hemorrhage in R caudate head into R basal ganglia similar. Small R lateral ventricle hemorrhage stable. Mass effect w/ partial effacement R lateral frontal horn, R to L subfalcine herniation and 2 mm midline shift similar.   MRI unchanged right MCA infarct with right BG 2cm hematoma. Unchanged MLS  2D Echo 10/30/18 EF 15-20%. AFib. AV regurg.  LDL 96  HgbA1c 4.9  SCDs for VTE prophylaxis  Eliquis (apixaban) daily prior to admission, now on No antithrombotic.   Check CT in 2 weeks. Resume Eliquis regimen if hemorrhagic conversion resolves. Agreeable with Dr. Cathie Olden.   Therapy recommendations:  CIR  Disposition:  CIR  Cerebral Edema  Started on hypertonic saline protocol via PIV  Na 140-141  3% saline discontinued given mild MLS and severe CHF  On NS now  Acute on chronic Congestive heart failure Nonischemic cardiomyopathy EF 15-20% s/p ICD  Recent hospitalizaiton  on Entresto bid,  tikosyn 250 bid, spironolactone 12.5 daily, lasix, and coreg  Cardiology consult  Continue home  meds  Tikosyn discontinued. Continue to hold at d/c. Potentially restart in 3 weeks in order to establish NSR. Dr. Cathie Olden defers to Dr. [name].    Ongoing BP montioring  Atrial Fibrillation  Recent cardioversion w/ resultant admission   Home anticoagulation:  Eliquis (apixaban) daily   Cardiology on board  Hold off anticoagulation at this time due to hemorrhage  Check CT in 2 weeks. Resume Eliquis regimen if hemorrhagic conversion resolves. Agreeable with Dr. Cathie Olden.   Hx stroke/TIA  08/2016 - L MCA s/p IR for L M2 occlusion with taking 3 reperfusion.  EF 30 to 35%.  Repeat CT no acute abnormality.  Did not do MRI due to ICD.  LDL 44, A1c 4.7.  His Xarelto changed to Eliquis and aspirin.  Hypertension  Home meds:  Coreg 25 bid, Lasix, spironolactone, Entresto  Off cleviprex  Home meds resumed  SBP goal <160   Long-term BP goal normotensive  Now Stable  Hyperlipidemia  Home meds:  No statin  LDL 96,  goal < 70  Put on Lipitor 20  Continue statin on discharge  Dysphagia  Secondary to stroke  Speech on board  Cleared for Dysphagia 2 thin liquids  Continue IV fluid at 40 cc for now  Right hip pain  Hip CXR negative   Likely LBP radiating pain or soft tissue contusion  Tramadol PRN  Tobacco abuse  Current smoker  Smoking cessation counseling provided  Pt is willing to quit   Other Stroke Risk Factors  Advanced age  ETOH use, advised to drink no more than 2 drink(s) a day  Substance abuse - hx THC and cocaine use - has not used cocaine in years but smokes marijuana daily.   Family hx stroke (maternal aunt)  Other Active Problems  Chronic Hep C s/p treatment  AKI, Cre 1.38  DISCHARGE EXAM Blood pressure 111/61, pulse 61, temperature 98.2 F (36.8 C), temperature source Oral, resp. rate 17, SpO2 98 %. General - Well nourished, well developed, in no apparent distress.  Ophthalmologic - fundi not visualized due to  noncooperation.  Cardiovascular - irregularly irregular heart rate and rhythm.  Mental Status -  Level of arousal and orientation to time, place, and person were intact. Language including expression, naming, repetition, comprehension was assessed and found intact. Moderate dysarthria. Left sensory neglect  Cranial Nerves II - XII - II - Visual field intact OU. III, IV, VI - Extraocular movements intact. V - Facial sensation intact bilaterally. VII - left facial droop. VIII - Hearing & vestibular intact bilaterally. X - Palate elevates symmetrically. XI - Chin turning & shoulder shrug intact bilaterally. XII - Tongue protrusion intact.  Motor Strength - The patient's strength was normal in right UE and LE extremities, however, LUE 3/5 deltoid, 3+/5 bicep, 4/5 tricep and 2/5 hand grip. LLE proximal 3+/5, knee extension 4/5 and toe DF 5/5.  Bulk was normal and fasciculations were absent.    Motor Tone - Muscle tone was assessed at the neck and appendages and was normal.  Reflexes - The patient's reflexes were symmetrical in all extremities and he had no pathological reflexes.  Sensory - Light touch, temperature/pinprick were assessed and were subjectively symmetrical.  However, pt does have sensory neglect on the left  Coordination - The patient had normal movements in the right hand with no ataxia or dysmetria.  Tremor was absent.  Gait and Station - deferred.  Discharge Diet  Dysphagia 2 thin liquids  DISCHARGE PLAN  Disposition:  Transfer to [facility] for ongoing PT, OT and ST  Check CT in 2 weeks. Resume Eliquis regimen if hemorrhagic conversion resolves. Agreeable with Dr. Cathie Olden.   Tikosyn on hold at d/c. Potentially restart in 3 weeks in order to establish NSR. Dr. Cathie Olden defers to Dr. [name].    Recommend ongoing stroke risk factor control by Primary Care Physician at time of discharge from inpatient rehabilitation.  Follow-up [name],  MD in 2 weeks following discharge from rehab.  Follow-up in [facility] in 4 weeks following discharge from rehab, office to schedule an appointment.   45 minutes were spent preparing discharge.  Rosalin Hawking, MD PhD Stroke Neurology 11/13/2018 7:27 PM

## 2018-11-13 NOTE — Progress Notes (Signed)
Jamse Arn, MD  Physician  Physical Medicine and Rehabilitation  PMR Pre-admission  Signed  Date of Service:  11/12/2018 12:11 PM      Related encounter: ED to Hosp-Admission (Current) from 11/10/2018 in [facility]      Signed         Show:Clear all [x] Manual[x] Template[x] Copied  Added by: [x] Cristina Gong, RN[x] Jamse Arn, MD  [] Hover for details PMR Admission Coordinator Pre-Admission Assessment  Patient: Glenn [name] is an 68 y.o., male MRN: [id number] DOB: [birth year] Height:   Weight:    Insurance Information HMO: yes    PPO:      PCP:      IPA:      80/20:      OTHER: medicare advantage plan PRIMARY: [employer] Medicare    Policy#: [id number]      Subscriber: pt CM Name: Jolayne Haines      Phone#: [phone] ext [phone]     Fax#: [phone] Pre-Cert#: [id number] approved for 7 days f/u [facility] phone ext   [phone]    Employer:  Benefits:  Phone #: [phone]     Name: 9/24 Eff. Date: 06/20/2018     Deduct: none      Out of Pocket Max: [phone]      Life Max: none CIR: because pt has medicaid it is covered at 100%      SNF: no co pay days 1 until 20; $178 co pay per day days 21 until 100 Outpatient: no co pay     Co-Pay: visits per medical neccesity Home Health: 100%      Co-Pay: visits per medical neccesity DME: 805     Co-Pay: 205 Providers: in network  SECONDARY: Medicaid [place] Access      Policy#: [id number]      Subscriber: pt [place] Medicaid Application Date:       Case Manager:  Disability Application Date:       Case Worker:   The Data Collection Information Summary for patients in Inpatient Rehabilitation Facilities with attached Privacy Act [place] Records was provided and verbally reviewed with: Patient and Family  Emergency Contact Information         Contact Information    Name Relation Home Work Mobile   [name],[name] Son [place], [place]   [phone]       Current Medical History  Patient Admitting Diagnosis:  CVA  History of Present Illness: 68 year old right-handed male with history of atrial fibrillation maintained on Eliquis status post ICDas well as recent cardioversion 7/0/9628, diastolic congestive heart failure with ejection fraction of 15 to 20%, hepatitis C, hypertension and prior left MCA infarction 2018 status post thrombectomy for left M2 occlusion, tobacco abuse as well as remote history of cocaine and medical noncompliance. Presented 11/10/2018 with acute left side weakness as well as facial droop. Noted BUN 1.54, COVID negative. Cranial CT scan showed large territory subacute hemorrhagic infarct right MCA territory. Area of hemorrhage measuring approximate 2 x 3 cm centered in the head of the caudate and putamen on the right. Patient did not receive TPA. CT angiogram of head and neck showed subtotal occlusion right M1 segment with irregularity most consistent with thrombus. Patient's Eliquis was reversed with Kcentra. Recent echocardiogram with ejection fraction 15 to 20% and severely reduced systolic function. Neurology follow-up initially maintained on hypertonic saline later discontinued. Follow-up cardiology services for nonischemic cardiomyopathy as well as acute on chronic congestive heart failure and currently maintained on  Coreg, Lasix, Aldactone as well as Entresto. Patient currently remains off anticoagulation however subcutaneous heparin was initiated for DVT prophylaxis 11/11/2018. Dysphasia #2 thin liquid diet.   Complete NIHSS TOTAL: 9  Patient's medical record from [facility] has been reviewed by the rehabilitation admission coordinator and physician.  Past Medical History      Past Medical History:  Diagnosis Date   Atrial fibrillation ([place])    CHF (congestive heart failure) ([place])    Hepatitis C    Hypertension    Stroke [facility])    Visit for monitoring Tikosyn therapy  03/26/2017    Family History   family history includes High blood pressure in his father and mother; Stroke in his maternal aunt.  Prior Rehab/Hospitalizations Has the patient had prior rehab or hospitalizations prior to admission? Yes  Has the patient had major surgery during 100 days prior to admission? No              Current Medications  Current Facility-Administered Medications:    0.9 %  sodium chloride infusion, , Intravenous, Continuous, Rosalin Hawking, MD, Last Rate: 40 mL/hr at 11/13/18 0402   acetaminophen (TYLENOL) tablet 650 mg, 650 mg, Oral, Q4H PRN, 650 mg at 11/13/18 0924 **OR** acetaminophen (TYLENOL) solution 650 mg, 650 mg, Per Tube, Q4H PRN **OR** acetaminophen (TYLENOL) suppository 650 mg, 650 mg, Rectal, Q4H PRN, Kerney Elbe, MD, 650 mg at 11/11/18 0636   atorvastatin (LIPITOR) tablet 20 mg, 20 mg, Oral, q1800, Rosalin Hawking, MD, 20 mg at 11/12/18 1737   carvedilol (COREG) tablet 25 mg, 25 mg, Oral, BID WC, Kerney Elbe, MD, 25 mg at 11/13/18 0838   chlorhexidine (PERIDEX) 0.12 % solution 15 mL, 15 mL, Mouth Rinse, BID, Greta Doom, MD, 15 mL at 11/13/18 1023   diazepam (VALIUM) tablet 5 mg, 5 mg, Oral, BID PRN, Kerney Elbe, MD, 5 mg at 11/11/18 1713   furosemide (LASIX) tablet 40 mg, 40 mg, Oral, Daily, Kerney Elbe, MD, 40 mg at 11/13/18 1022   gabapentin (NEURONTIN) capsule 300 mg, 300 mg, Oral, TID, Kerney Elbe, MD, 300 mg at 11/13/18 1022   heparin injection 5,000 Units, 5,000 Units, Subcutaneous, Q8H, Rosalin Hawking, MD, 5,000 Units at 11/13/18 1027   labetalol (NORMODYNE) injection 5-20 mg, 5-20 mg, Intravenous, Q2H PRN, Rosalin Hawking, MD   MEDLINE mouth rinse, 15 mL, Mouth Rinse, q12n4p, Greta Doom, MD, 15 mL at 11/12/18 1742   metoprolol tartrate (LOPRESSOR) injection 2.5 mg, 2.5 mg, Intravenous, Q1H PRN, Greta Doom, MD, 2.5 mg at 11/10/18 2358   potassium chloride SA (K-DUR) CR tablet 20 mEq, 20 mEq, Oral,  Daily, Kerney Elbe, MD, 20 mEq at 11/13/18 1022   sacubitril-valsartan (ENTRESTO) 24-26 mg per tablet, 1 tablet, Oral, BID, Kerney Elbe, MD, 1 tablet at 11/13/18 1023   senna-docusate (Senokot-S) tablet 1 tablet, 1 tablet, Oral, BID, Kerney Elbe, MD, 1 tablet at 11/12/18 1002   sodium chloride flush (NS) 0.9 % injection 3 mL, 3 mL, Intravenous, Once, [place], Nathaneil Canary, MD   spironolactone (ALDACTONE) tablet 12.5 mg, 12.5 mg, Oral, Daily, Kerney Elbe, MD, 12.5 mg at 11/13/18 1023   traMADol (ULTRAM) tablet 50 mg, 50 mg, Oral, Q8H PRN, Rosalin Hawking, MD  Patients Current Diet:     Diet Order                  DIET DYS 2 Room service appropriate? No; Fluid consistency: Thin  Diet effective now  Precautions / Restrictions Precautions Precautions: Fall Precaution Comments: Poor awareness, L inattention Restrictions Weight Bearing Restrictions: No   Has the patient had 2 or more falls or a fall with injury in the past year? No  Prior Activity Level Community (5-7x/wk): Independent and driving  Prior Functional Level Self Care: Did the patient need help bathing, dressing, using the toilet or eating? Independent  Indoor Mobility: Did the patient need assistance with walking from room to room (with or without device)? Independent  Stairs: Did the patient need assistance with internal or external stairs (with or without device)? Independent  Functional Cognition: Did the patient need help planning regular tasks such as shopping or remembering to take medications? Independent  Home Assistive Devices / Equipment Home Equipment: None  Prior Device Use: Indicate devices/aids used by the patient prior to current illness, exacerbation or injury? None of the above  Current Functional Level Cognition  Arousal/Alertness: Awake/alert Overall Cognitive Status: Impaired/Different from baseline Current Attention Level: Sustained Orientation Level:  Oriented X4 Following Commands: Follows one step commands inconsistently, Follows one step commands with increased time Safety/Judgement: Decreased awareness of safety, Decreased awareness of deficits General Comments: Pt requiring increased cues and time throughout session. Pt with poor awareness of deficits. Pt with poor attention of left side (body and environment) Attention: Sustained Sustained Attention: Appears intact Memory: (TBA further suspect deficits) Awareness: Impaired Awareness Impairment: Emergent impairment, Anticipatory impairment Problem Solving: Impaired Problem Solving Impairment: Functional complex Safety/Judgment: Impaired    Extremity Assessment (includes Sensation/Coordination)  Upper Extremity Assessment: Defer to OT evaluation LUE Deficits / Details: Decreased strength and coorindation. Poor grasp as seen during MMT, ADLs, and mobility. Pt unable to perform finger opposition. Pt able to lift arm off the bed.  LUE Coordination: decreased fine motor, decreased gross motor  Lower Extremity Assessment: LLE deficits/detail LLE Deficits / Details: AROM WFL, but noted hip adductor, IR weakness with leg out to side with hip flexion in supine, knee extension 3/5, ankle DF 3+/5 LLE Sensation: (reports intact to light touch, but needs more formal assessment)    ADLs  Overall ADL's : Needs assistance/impaired Eating/Feeding: NPO Grooming: Minimal assistance, Sitting Upper Body Bathing: Minimal assistance, Sitting Lower Body Bathing: Maximal assistance, +2 for safety/equipment, Sit to/from stand, +2 for physical assistance Upper Body Dressing : Moderate assistance, Sitting Lower Body Dressing: Maximal assistance, +2 for safety/equipment, +2 for physical assistance, Sit to/from stand Toilet Transfer: Ambulation, RW, Minimal assistance, +2 for safety/equipment(Simulated to recliner) Toilet Transfer Details (indicate cue type and reason): Pt performing sit>stand with Min  guard A for safety and assistance to place LUE onto RW. Pt requiring Min A for safe descent to recliner Functional mobility during ADLs: Maximal assistance, +2 for physical assistance, +2 for safety/equipment, Rolling walker, Moderate assistance General ADL Comments: Pt presenting with poor balance, strength, cognition, attention of left side, vision, and activity tolerance.    Mobility  Overal bed mobility: Needs Assistance Bed Mobility: Supine to Sit Supine to sit: Min assist General bed mobility comments: reached for hand held assist to come up to sit and needed cues for righting trunk in sitting    Transfers  Overall transfer level: Needs assistance Equipment used: Rolling walker (2 wheeled) Transfers: Sit to/from Stand Sit to Stand: Min guard, Min assist, +2 safety/equipment General transfer comment: Pt performing sit>stand with Min guard A for safety and assistance to place LUE onto RW; pt pulling with RUE on RW. Pt requiring Min A for safe descent to recliner  Ambulation / Gait / Stairs / Wheelchair Mobility  Ambulation/Gait Ambulation/Gait assistance: Mod assist, +2 physical assistance Gait Distance (Feet): 65 Feet Assistive device: Rolling walker (2 wheeled) Gait Pattern/deviations: Step-to pattern, Decreased stride length, Decreased step length - left, Decreased dorsiflexion - left, Steppage, Shuffle General Gait Details: leaning L and with dragging L foot at times, over time with increased L lateral lean and needing max facilitation for R weight shift for balance and to clear L foot.  Patient felt as if I was contributing to his LOB by assisting him to weight bear on R LE.    Posture / Balance Balance Overall balance assessment: Needs assistance Sitting-balance support: No upper extremity supported, Feet supported Sitting balance-Leahy Scale: Fair Standing balance support: Bilateral upper extremity supported, During functional activity Standing balance-Leahy Scale:  Poor Standing balance comment: Reliant on physical A and UE support    Special needs/care consideration BiPAP/CPAP n/a CPM  N/a Continuous Drip IV  N/a Dialysis n/a Life Vest  N/a Oxygen  N/a Special Bed  N/a Trach Size  N/a Wound Vac n/a Skin intact Bowel mgmt: incontinent LBM 9/23 Bladder mgmt: external catheter Diabetic mgmt: Hgb A1c 4.9 Behavioral consideration  N/a Chemo/radiation n/a Designated visitor is [place] ICD   Previous Home Environment  Living Arrangements: Other (Comment)(friend/room mate Jackelyn Poling)  Lives With: Friend(s) Available Help at Discharge: Family, Available 24 hours/day Type of Home: House Home Layout: One level Home Access: Stairs to enter Technical brewer of Steps: 5 Bathroom Shower/Tub: Chiropodist: Standard Bathroom Accessibility: Yes How Accessible: Accessible via walker [place]: No  Discharge Living Setting Plans for Discharge Living Setting: Patient's home, Lives with (comment)(friend/room mate Debbie) Type of Home at Discharge: House Discharge Home Layout: One level Discharge Home Access: Stairs to enter Entrance Stairs-Number of Steps: 5 Discharge Bathroom Shower/Tub: Tub/shower unit, Curtain Discharge Bathroom Toilet: Standard Discharge Bathroom Accessibility: Yes How Accessible: Accessible via walker Does the patient have any problems obtaining your medications?: No  Social/Family/Support Systems Patient Roles: Parent(one adult son, [name]) Contact Information: [name] Anticipated Caregiver: friend/roommate Research scientist (medical) Information: see above Caregiver Availability: 24/7 Discharge Plan Discussed with Primary Caregiver: Yes Is Caregiver In Agreement with Plan?: Yes Does Caregiver/Family have Issues with Lodging/Transportation while Pt is in Rehab?: No  Goals/Additional Needs Patient/Family Goal for Rehab: supervision PT, OT and SLP Expected length of stay: ELOS  10 to 14 days Pt/Family Agrees to Admission and willing to participate: Yes Program Orientation Provided & Reviewed with Pt/Caregiver Including Roles  & Responsibilities: Yes  Decrease burden of Care through IP rehab admission: n/a  Possible need for SNF placement upon discharge: not anticipated  Patient Condition: I have reviewed medical records from [facility] , spoken with CM, and patient and son. I met with patient at the bedside for inpatient rehabilitation assessment.  Patient will benefit from ongoing PT, OT and SLP, can actively participate in 3 hours of therapy a day 5 days of the week, and can make measurable gains during the admission.  Patient will also benefit from the coordinated team approach during an Inpatient Acute Rehabilitation admission.  The patient will receive intensive therapy as well as Rehabilitation physician, nursing, social worker, and care management interventions.  Due to bladder management, bowel management, safety, skin/wound care, disease management, medication administration, pain management and patient education the patient requires 24 hour a day rehabilitation nursing.  The patient is currently  Mod assist with mobility and basic ADLs.  Discharge setting and therapy  post discharge at home with home health is anticipated.  Patient has agreed to participate in the Acute Inpatient Rehabilitation Program and will admit today.  Preadmission Screen Completed By:  Cleatrice Burke, 11/13/2018 1:15 PM ______________________________________________________________________   Discussed status with Dr. Posey Pronto on  11/13/2018 at  1315 and received approval for admission today.  Admission Coordinator:  Cleatrice Burke, RN, time  3888 Date  11/13/2018   Assessment/Plan: Diagnosis: right MCA hemorrhage  1. Does the need for close, 24 hr/day Medical supervision in concert with the patient's rehab needs make it unreasonable for this patient to be  served in a less intensive setting? Yes  2. Co-Morbidities requiring supervision/potential complications: atrial fibrillation maintained on Eliquis status post ICDas well as recent cardioversion 03/29/32, diastolic congestive heart failure with ejection fraction of 15 to 20%, hepatitis C, hypertension and prior left MCA infarction 2018 status post thrombectomy for left M2 occlusion, tobacco abuse as well as remote history of cocaine and medical noncompliance 3. Due to bladder management, bowel management, safety, skin/wound care, disease management, medication administration and patient education, does the patient require 24 hr/day rehab nursing? Yes 4. Does the patient require coordinated care of a physician, rehab nurse, PT (1-2 hrs/day, 5 days/week), OT (1-2 hrs/day, 5 days/week) and SLP (1-2 hrs/day, 5 days/week) to address physical and functional deficits in the context of the above medical diagnosis(es)? Yes Addressing deficits in the following areas: balance, endurance, locomotion, strength, transferring, bathing, dressing, toileting, cognition, speech, language, swallowing and psychosocial support 5. Can the patient actively participate in an intensive therapy program of at least 3 hrs of therapy 5 days a week? Yes 6. The potential for patient to make measurable gains while on inpatient rehab is excellent 7. Anticipated functional outcomes upon discharge from inpatients are: supervision and min assist PT, supervision and min assist OT, supervision SLP 8. Estimated rehab length of stay to reach the above functional goals is: 17-21 days. 9. Anticipated D/C setting: Home 10. Anticipated post D/C treatments: HH therapy and Home excercise program 11. Overall Rehab/Functional Prognosis: good  MD Signature: Delice Lesch, MD, ABPMR        Revision History

## 2018-11-13 NOTE — Progress Notes (Signed)
Patient arrived on rehab unit today,were he was informed about patient safety plan and rehab booklet. 

## 2018-11-13 NOTE — Progress Notes (Signed)
Pt transfer to CIR. Pt is alert and oriented, no new concern on room air. D/c instructions given to [facility]

## 2018-11-13 NOTE — TOC Transition Note (Signed)
Transition of Care [facility]) - CM/SW Discharge Note   Patient Details  Name: Glenn [name] MRN: [id number] Date of Birth: 20-May-1950  [facility] CM/SW Contact:  Pollie Friar, RN Phone Number: 11/13/2018, 1:31 PM   Clinical Narrative:    Pt is discharging to CIR today. CM signing off.    Final next level of care: IP Rehab Facility Barriers to Discharge: No Barriers Identified   Patient Goals and CMS Choice        Discharge Placement                       Discharge Plan and Services                                     Social Determinants of Health (SDOH) Interventions     Readmission Risk Interventions No flowsheet data found.

## 2018-11-13 NOTE — Progress Notes (Signed)
Progress Note  Patient Name: Glenn [name] Date of Encounter: 11/13/2018  Primary Cardiologist: Virl Axe, MD    Subjective   Patient is a 68 year old gentleman with a history of atrial fibrillation.  He has a history of an ICD and has been on Eliquis.  He has chronic systolic congestive heart failure with an ejection fraction of 15 to 20%.  He was admitted with an intracerebral bleed.  Eliquis has been discontinued.  In addition, Tikosyn has also been on hold.  He complains of having a headache.  He has no cardiac symptoms.  He still has severe neurologic deficits.  Eliquis has been on hold   Neuro has said that we would likely be able to restart his oral anticoagulation once the hemorrhagic conversion resolves.   Inpatient Medications    Scheduled Meds:  atorvastatin  20 mg Oral q1800   carvedilol  25 mg Oral BID WC   chlorhexidine  15 mL Mouth Rinse BID   furosemide  40 mg Oral Daily   gabapentin  300 mg Oral TID   heparin injection (subcutaneous)  5,000 Units Subcutaneous Q8H   mouth rinse  15 mL Mouth Rinse q12n4p   potassium chloride SA  20 mEq Oral Daily   sacubitril-valsartan  1 tablet Oral BID   senna-docusate  1 tablet Oral BID   sodium chloride flush  3 mL Intravenous Once   spironolactone  12.5 mg Oral Daily   Continuous Infusions:  sodium chloride 40 mL/hr at 11/13/18 0402   PRN Meds: acetaminophen **OR** acetaminophen (TYLENOL) oral liquid 160 mg/5 mL **OR** acetaminophen, diazepam, labetalol, metoprolol tartrate   Vital Signs    Vitals:   11/12/18 2035 11/13/18 0002 11/13/18 0425 11/13/18 0806  BP: 128/87 (!) 116/95 116/85 130/89  Pulse: 61 88 91 71  Resp: 18 18 18 18   Temp: 98.8 F (37.1 C) 97.9 F (36.6 C) 98.6 F (37 C) 98.8 F (37.1 C)  TempSrc: Oral   Oral  SpO2: 98% 96% 94% 99%    Intake/Output Summary (Last 24 hours) at 11/13/2018 0927 Last data filed at 11/13/2018 0500 Gross per 24 hour  Intake 120 ml  Output  1150 ml  Net -1030 ml   Last 3 Weights 11/06/2018 11/05/2018 11/04/2018  Weight (lbs) 195 lb 8 oz 197 lb 201 lb 12.8 oz  Weight (kg) 88.678 kg 89.359 kg 91.536 kg      Telemetry    AF with well controlled V response  Personally Reviewed  ECG       Physical Exam   Physical Exam: Blood pressure 111/61, pulse 61, temperature 98.2 F (36.8 C), temperature source Oral, resp. rate 17, SpO2 98 %.  GEN:  Well nourished, well developed in no acute distress HEENT: Normal NECK: No JVD; No carotid bruits LYMPHATICS: No lymphadenopathy CARDIAC: irreg. Irreg.  RESPIRATORY:  Clear to auscultation without rales, wheezing or rhonchi  ABDOMEN: Soft, non-tender, non-distended MUSCULOSKELETAL:  No edema; No deformity  SKIN: Warm and dry NEUROLOGIC:  Weakness on left side, more alert today    Labs    High Sensitivity Troponin:   Recent Labs  Lab 10/29/18 2355 10/30/18 0127 10/30/18 0339 10/30/18 1649 10/30/18 1826  TROPONINIHS 55* 58* 52* 31* 32*      Chemistry Recent Labs  Lab 11/10/18 1753  11/11/18 0329 11/11/18 0546 11/12/18 0518 11/13/18 0402  NA 137   < > 174* 140 141 141  K 4.7   < > 3.8  --  4.5 4.1  CL 103   < > >130*  --  110 108  CO2 21*  --  23  --  21* 21*  GLUCOSE 126*   < > 110*  --  105* 102*  BUN 15   < > 12  --  15 13  CREATININE 1.54*   < > 1.23  --  1.33* 1.38*  CALCIUM 10.0  --  7.6*  --  8.6* 8.8*  PROT 8.2*  --   --   --   --   --   ALBUMIN 4.4  --   --   --   --   --   AST 26  --   --   --   --   --   ALT 21  --   --   --   --   --   ALKPHOS 94  --   --   --   --   --   BILITOT 0.9  --   --   --   --   --   GFRNONAA 46*  --  >60  --  55* 53*  GFRAA 54*  --  >60  --  >60 >60  ANIONGAP 13  --  NOT CALCULATED  --  10 12   < > = values in this interval not displayed.     Hematology Recent Labs  Lab 11/11/18 0329 11/12/18 0518 11/13/18 0402  WBC 8.6 8.9 8.8  RBC 4.23 4.48 4.44  HGB 13.7 14.7 14.4  HCT 40.4 44.0 42.2  MCV 95.5 98.2 95.0    MCH 32.4 32.8 32.4  MCHC 33.9 33.4 34.1  RDW 13.2 13.2 12.8  PLT 145* 121* 132*    BNPNo results for input(s): BNP, PROBNP in the last 168 hours.   DDimer No results for input(s): DDIMER in the last 168 hours.   Radiology    [name] Contrast  Result Date: 11/12/2018 CLINICAL DATA:  Follow-up stroke.  Intracranial hemorrhage. EXAM: MRI HEAD WITHOUT CONTRAST TECHNIQUE: Multiplanar, multiecho pulse sequences of the brain and surrounding structures were obtained without intravenous contrast. COMPARISON:  Head CT 11/11/2018 and 11/10/2018 FINDINGS: Brain: Diffusion imaging redemonstrates acute infarction in the anterior portion of the right middle cerebral artery territory and possibly some of the anterior cerebral artery territory, with confluent involvement of the right anterior frontal lobe, frontal operculum region, insula, basal ganglia and frontoparietal junction region. Hemorrhage into the right basal ganglia appears similar, maximal dimension 2 cm. Layering intraventricular blood in the occipital horn of the right lateral ventricle as seen previously. No acute infarction in the left hemisphere or affecting the brainstem or cerebellum. Swelling in the region of infarction, with right-to-left shift measuring up to 8 mm. No ventricular trapping. No extra-axial collection. Vascular: Major vessels at the base of the brain show flow. Skull and upper cervical spine: Negative Sinuses/Orbits: Clear/normal Other: None IMPRESSION: No evidence of extension of the right brain infarction. Acute infarction affecting the right frontal lobe, frontal operculum, insula and frontoparietal junction region, with involvement of the right basal ganglia. 2 cm hematoma in the right basal ganglia has not enlarged. Intraventricular blood has not increased. Swelling and mass effect, right-to-left shift measuring up to 8 mm, unchanged when measured in the same location on yesterday's CT. Electronically Signed   By: Nelson Chimes M.D.   On: 11/12/2018 16:12   Dg Swallowing Func-speech Pathology  Result Date: 11/12/2018 Objective Swallowing Evaluation: Type of Study: Bedside Swallow Evaluation  Patient Details Name: ABDURAHMAN FITZER MRN: [id number] Date of Birth: [birth year] Today's Date: 11/12/2018 Time: SLP Start Time (ACUTE ONLY): 0900 -SLP Stop Time (ACUTE ONLY): [id number] SLP Time Calculation (min) (ACUTE ONLY): 24 min Past Medical History: Past Medical History: Diagnosis Date  Atrial fibrillation ([place])   CHF (congestive heart failure) ([place])   Hepatitis C   Hypertension   Stroke [facility])   Visit for monitoring Tikosyn therapy 03/26/2017 Past Surgical History: Past Surgical History: Procedure Laterality Date  CARDIAC DEFIBRILLATOR PLACEMENT  2015  CARDIOVERSION N/A 10/10/2016  Procedure: CARDIOVERSION;  Surgeon: Dorothy Spark, MD;  Location: [place];  Service: Cardiovascular;  Laterality: N/A;  CARDIOVERSION N/A 03/27/2017  Procedure: CARDIOVERSION;  Surgeon: Jerline Pain, MD;  Location: [place];  Service: Cardiovascular;  Laterality: N/A;  CARDIOVERSION N/A 10/29/2018  Procedure: CARDIOVERSION;  Surgeon: Sanda Klein, MD;  Location: [place];  Service: Cardiovascular;  Laterality: N/A;  CARDIOVERSION N/A 11/05/2018  Procedure: CARDIOVERSION;  Surgeon: Naevia Unterreiner, Wonda Cheng, MD;  Location: [place];  Service: Cardiovascular;  Laterality: N/A;  EYE SURGERY Left 1990  IR PERCUTANEOUS ART THROMBECTOMY/INFUSION INTRACRANIAL INC DIAG ANGIO  09/05/2016  IR RADIOLOGIST EVAL & MGMT  10/03/2016  RADIOLOGY WITH ANESTHESIA N/A 09/05/2016  Procedure: RADIOLOGY WITH ANESTHESIA;  Surgeon: Luanne Bras, MD;  Location: [place];  Service: Radiology;  Laterality: N/A;  RIGHT/LEFT HEART CATH AND CORONARY ANGIOGRAPHY N/A 11/03/2018  Procedure: RIGHT/LEFT HEART CATH AND CORONARY ANGIOGRAPHY;  Surgeon: Lorretta Harp, MD;  Location: [place] CV [facility];  Service: Cardiovascular;  Laterality: N/A; HPI: MATHIS NEAULT is  an 68 y.o. male with atrial fibrillation on Eliquis, CHF, hepatitis C, HTN, recent cardiac defibrillator placement and prior stroke admitted with L side weakness, slurred speech, and altered mental status. CT showed large subacute hermorrhagic infarct R MCA. CXR was unremarkable. BSE 09/07/16 revealed functional swallow and regular/thin liquids rec'd. Pt was given the [place] and RN reported coughing and choking, currently NPO.  Subjective: "want some water" Assessment / Plan / Recommendation CHL IP CLINICAL IMPRESSIONS 11/12/2018 Clinical Impression MBS revealed mild-moderate oropharyngeal dysphagia characterized by anterior spillage 2/2 L weakness and reduced epiglottic inversion that resulted in flash penetration with no instances of aspiration. Although epiglottis inversion is absent or reduced, there was enough constriction via pharyngeal peristalsis to prevent aspiration across all consistencies. Thin liquids were swallowed quickly with appropriate timing compared to nectar thick which resulted in a delayed, uncoordinated swallow and greater pharyngeal residue. Mild-mod prolonged mastication and transit with regular solids and significant barium in left lateral sulci. He was receptive to chewing on his stronger R side and scanning for pocketing on his weak side. Pt was unable to swallow pill given thins or puree and expectorated. Recommend Dysphagia 2 diet with thin liquids, straws allowed, meds crushed in puree and cueing for compensatory strategies of R side chewing and lingual sweeping for L pocketing.    SLP Visit Diagnosis Dysphagia, oropharyngeal phase (R13.12) Attention and concentration deficit following -- Frontal lobe and executive function deficit following -- Impact on safety and function Mild aspiration risk   CHL IP TREATMENT RECOMMENDATION 11/12/2018 Treatment Recommendations Therapy as outlined in treatment plan below   Prognosis 11/12/2018 Prognosis for Safe Diet Advancement Good Barriers to Reach  Goals -- Barriers/Prognosis Comment -- CHL IP DIET RECOMMENDATION 11/12/2018 SLP Diet Recommendations Dysphagia 2 (Fine chop) solids;Thin liquid Liquid Administration via Cup;Straw Medication Administration Crushed with puree Compensations Monitor for anterior loss;Lingual sweep for clearance of pocketing Postural Changes Seated upright at 90  degrees   CHL IP OTHER RECOMMENDATIONS 11/12/2018 Recommended Consults -- Oral Care Recommendations Oral care BID Other Recommendations --   CHL IP FOLLOW UP RECOMMENDATIONS 11/12/2018 Follow up Recommendations Inpatient Rehab   CHL IP FREQUENCY AND DURATION 11/12/2018 Speech Therapy Frequency (ACUTE ONLY) min 2x/week Treatment Duration 2 weeks      CHL IP ORAL PHASE 11/12/2018 Oral Phase Impaired Oral - Pudding Teaspoon -- Oral - Pudding Cup -- Oral - Honey Teaspoon -- Oral - Honey Cup -- Oral - Nectar Teaspoon -- Oral - Nectar Cup Left anterior bolus loss Oral - Nectar Straw -- Oral - Thin Teaspoon -- Oral - Thin Cup Left anterior bolus loss Oral - Thin Straw Left anterior bolus loss Oral - Puree -- Oral - Mech Soft -- Oral - Regular Left anterior bolus loss;Delayed oral transit;Weak lingual manipulation Oral - Multi-Consistency -- Oral - Pill Weak lingual manipulation;Holding of bolus Oral Phase - Comment --  CHL IP PHARYNGEAL PHASE 11/12/2018 Pharyngeal Phase Impaired Pharyngeal- Pudding Teaspoon -- Pharyngeal -- Pharyngeal- Pudding Cup -- Pharyngeal -- Pharyngeal- Honey Teaspoon -- Pharyngeal -- Pharyngeal- Honey Cup -- Pharyngeal -- Pharyngeal- Nectar Teaspoon -- Pharyngeal -- Pharyngeal- Nectar Cup Pharyngeal residue - valleculae;Pharyngeal residue - pyriform;Reduced epiglottic inversion Pharyngeal -- Pharyngeal- Nectar Straw -- Pharyngeal -- Pharyngeal- Thin Teaspoon -- Pharyngeal -- Pharyngeal- Thin Cup Reduced epiglottic inversion;Penetration/Aspiration during swallow Pharyngeal Material enters airway, remains ABOVE vocal cords then ejected out Pharyngeal- Thin Straw  Reduced epiglottic inversion Pharyngeal -- Pharyngeal- Puree -- Pharyngeal -- Pharyngeal- Mechanical Soft -- Pharyngeal -- Pharyngeal- Regular Reduced epiglottic inversion;Pharyngeal residue - pyriform;Pharyngeal residue - valleculae Pharyngeal -- Pharyngeal- Multi-consistency -- Pharyngeal -- Pharyngeal- Pill NT Pharyngeal -- Pharyngeal Comment --  CHL IP CERVICAL ESOPHAGEAL PHASE 11/12/2018 Cervical Esophageal Phase WFL Pudding Teaspoon -- Pudding Cup -- Honey Teaspoon -- Honey Cup -- Nectar Teaspoon -- Nectar Cup -- Nectar Straw -- Thin Teaspoon -- Thin Cup -- Thin Straw -- Puree -- Mechanical Soft -- Regular -- Multi-consistency -- Pill -- Cervical Esophageal Comment -- [place] Siren 11/12/2018, 4:15 PM [name] Litaker M.Ed Actor Pager [phone] Office [phone]               Cardiac Studies     Patient Profile     68 y.o. male with history of atrial fibrillation.  He is now back in atrial fibrillation and has had a large intracerebral bleed.  Assessment & Plan    1.  Atrial fibrillation: At this point he is not able to restart anticoagulation because of his large intracerebral bleed.  We will also discontinue the Tikosyn as at this point I think that would be more risky to convert him to sinus rhythm.     Eliquis has been on hold   Neuro has said that we would likely be able to restart his oral anticoagulation once the hemorrhagic conversion resolves.   I think we should start eliquis as soon as neuro clears him to restart.    Will have Dr. [name] weigh in on this but we may choose a strategy of rate control and anticoagulation since he is not all that well controlled on Tikosyn   2.  Chronic systolic congestive heart failure: His ejection fraction is 15 to 20%.  Unfortunately he is quite debilitated at this point.  Continue carvedilol, spironolactone, Entresto. He is at high risk of thromboembolus.   The plan is to restart Eliquis once he is cleared by  neuro ( after resolution of the hemorrhagic  transformation )   3.  Hyperlipidemia:  Continue current meds.   4.  Intracranial hemorrhage:  Further plans per neuro      For questions or updates, please contact [place] Please consult [email or web address] for contact info under        Signed, [name], MD  11/13/2018, 9:27 AM

## 2018-11-13 NOTE — Plan of Care (Signed)
Problem: Consults Goal: RH STROKE PATIENT EDUCATION Description: See Patient Education module for education specifics  Outcome: Progressing Goal: Nutrition Consult-if indicated Outcome: Progressing Goal: Diabetes Guidelines if Diabetic/Glucose > 140 Description: If diabetic or lab glucose is > 140 mg/dl - Initiate Diabetes/Hyperglycemia Guidelines & Document Interventions  Outcome: Progressing   Problem: RH BOWEL ELIMINATION Goal: RH STG MANAGE BOWEL WITH ASSISTANCE Description: STG Manage Bowel with Assistance. 11/13/2018 1658 by Glean Salen, RN Outcome: Progressing Flowsheets (Taken 11/13/2018 1658) STG: Pt will manage bowels with assistance: 4-Minimum assistance 11/13/2018 1657 by Glean Salen, RN Outcome: Progressing Goal: RH STG MANAGE BOWEL W/MEDICATION W/ASSISTANCE Description: STG Manage Bowel with Medication with Assistance. 11/13/2018 1658 by Glean Salen, RN Outcome: Progressing Flowsheets (Taken 11/13/2018 1658) STG: Pt will manage bowels with medication with assistance: 4-Minimal assistance 11/13/2018 1657 by Glean Salen, RN Outcome: Progressing   Problem: RH BLADDER ELIMINATION Goal: RH STG MANAGE BLADDER WITH ASSISTANCE Description: STG Manage Bladder With Assistance 11/13/2018 1658 by Glean Salen, RN Outcome: Progressing Flowsheets (Taken 11/13/2018 1658) STG: Pt will manage bladder with assistance: 1-Total assistance 11/13/2018 1657 by Glean Salen, RN Outcome: Progressing Goal: RH STG MANAGE BLADDER WITH MEDICATION WITH ASSISTANCE Description: STG Manage Bladder With Medication With Assistance. 11/13/2018 1658 by Glean Salen, RN Outcome: Progressing Flowsheets (Taken 11/13/2018 1658) STG: Pt will manage bladder with medication with assistance: 1-Total assistance 11/13/2018 1657 by Glean Salen, RN Outcome: Progressing Goal: RH STG MANAGE BLADDER WITH EQUIPMENT WITH ASSISTANCE Description: STG Manage Bladder With Equipment With  Assistance 11/13/2018 1658 by Glean Salen, RN Outcome: Progressing Flowsheets (Taken 11/13/2018 1658) STG: Pt will manage bladder with equipment with assistance: 1-Total assistance 11/13/2018 1657 by Glean Salen, RN Outcome: Progressing   Problem: RH SKIN INTEGRITY Goal: RH STG SKIN FREE OF INFECTION/BREAKDOWN 11/13/2018 1658 by Glean Salen, RN Outcome: Progressing 11/13/2018 1657 by Glean Salen, RN Outcome: Progressing Goal: RH STG MAINTAIN SKIN INTEGRITY WITH ASSISTANCE Description: STG Maintain Skin Integrity With Assistance. 11/13/2018 1658 by Glean Salen, RN Outcome: Progressing Flowsheets (Taken 11/13/2018 1658) STG: Maintain skin integrity with assistance: 6-Modified independent 11/13/2018 1657 by Glean Salen, RN Outcome: Progressing Goal: RH STG ABLE TO PERFORM INCISION/WOUND CARE W/ASSISTANCE Description: STG Able To Perform Incision/Wound Care With Assistance. 11/13/2018 1658 by Glean Salen, RN Outcome: Progressing Flowsheets (Taken 11/13/2018 1658) STG: Pt will be able to perform incision/wound care with assistance: 6-Modified independent 11/13/2018 1657 by Glean Salen, RN Outcome: Progressing   Problem: RH SAFETY Goal: RH STG ADHERE TO SAFETY PRECAUTIONS W/ASSISTANCE/DEVICE Description: STG Adhere to Safety Precautions With Assistance/Device. 11/13/2018 1658 by Glean Salen, RN Outcome: Progressing Flowsheets (Taken 11/13/2018 1658) STG:Pt will adhere to safety precautions with assistance/device: 3-Moderate assistance 11/13/2018 1657 by Glean Salen, RN Outcome: Progressing Goal: RH STG DECREASED RISK OF FALL WITH ASSISTANCE Description: STG Decreased Risk of Fall With Assistance. 11/13/2018 1658 by Glean Salen, RN Outcome: Progressing Flowsheets (Taken 11/13/2018 1658) [id number] risk of fall  with assistance/device: 3-Moderate assistance 11/13/2018 1657 by Glean Salen, RN Outcome: Progressing   Problem: RH COGNITION-NURSING Goal: RH  STG USES MEMORY AIDS/STRATEGIES W/ASSIST TO PROBLEM SOLVE Description: STG Uses Memory Aids/Strategies With Assistance to Problem Solve. Outcome: Progressing Goal: RH STG ANTICIPATES NEEDS/CALLS FOR ASSIST W/ASSIST/CUES Description: STG Anticipates Needs/Calls for Assist With Assistance/Cues. Outcome: Progressing   Problem: RH PAIN MANAGEMENT Goal: RH STG PAIN MANAGED AT OR BELOW PT'S PAIN GOAL Outcome: Progressing   Problem: RH KNOWLEDGE  DEFICIT Goal: RH STG INCREASE KNOWLEDGE OF DIABETES Outcome: Progressing Goal: RH STG INCREASE KNOWLEDGE OF HYPERTENSION Outcome: Progressing Goal: RH STG INCREASE KNOWLEDGE OF DYSPHAGIA/FLUID INTAKE Outcome: Progressing Goal: RH STG INCREASE KNOWLEGDE OF HYPERLIPIDEMIA Outcome: Progressing Goal: RH STG INCREASE KNOWLEDGE OF STROKE PROPHYLAXIS Outcome: Progressing

## 2018-11-13 NOTE — Plan of Care (Signed)
  Problem: Consults Goal: RH STROKE PATIENT EDUCATION Description: See Patient Education module for education specifics  Outcome: Progressing Goal: Nutrition Consult-if indicated Outcome: Progressing Goal: Diabetes Guidelines if Diabetic/Glucose > 140 Description: If diabetic or lab glucose is > 140 mg/dl - Initiate Diabetes/Hyperglycemia Guidelines & Document Interventions  Outcome: Progressing   Problem: RH BOWEL ELIMINATION Goal: RH STG MANAGE BOWEL WITH ASSISTANCE Description: STG Manage Bowel with Assistance. Outcome: Progressing Goal: RH STG MANAGE BOWEL W/MEDICATION W/ASSISTANCE Description: STG Manage Bowel with Medication with Assistance. Outcome: Progressing   Problem: RH BLADDER ELIMINATION Goal: RH STG MANAGE BLADDER WITH ASSISTANCE Description: STG Manage Bladder With Assistance Outcome: Progressing Goal: RH STG MANAGE BLADDER WITH MEDICATION WITH ASSISTANCE Description: STG Manage Bladder With Medication With Assistance. Outcome: Progressing Goal: RH STG MANAGE BLADDER WITH EQUIPMENT WITH ASSISTANCE Description: STG Manage Bladder With Equipment With Assistance Outcome: Progressing   Problem: RH SKIN INTEGRITY Goal: RH STG SKIN FREE OF INFECTION/BREAKDOWN Outcome: Progressing Goal: RH STG MAINTAIN SKIN INTEGRITY WITH ASSISTANCE Description: STG Maintain Skin Integrity With Assistance. Outcome: Progressing Goal: RH STG ABLE TO PERFORM INCISION/WOUND CARE W/ASSISTANCE Description: STG Able To Perform Incision/Wound Care With Assistance. Outcome: Progressing   Problem: RH SAFETY Goal: RH STG ADHERE TO SAFETY PRECAUTIONS W/ASSISTANCE/DEVICE Description: STG Adhere to Safety Precautions With Assistance/Device. Outcome: Progressing Goal: RH STG DECREASED RISK OF FALL WITH ASSISTANCE Description: STG Decreased Risk of Fall With Assistance. Outcome: Progressing   Problem: RH COGNITION-NURSING Goal: RH STG USES MEMORY AIDS/STRATEGIES W/ASSIST TO PROBLEM  SOLVE Description: STG Uses Memory Aids/Strategies With Assistance to Problem Solve. Outcome: Progressing Goal: RH STG ANTICIPATES NEEDS/CALLS FOR ASSIST W/ASSIST/CUES Description: STG Anticipates Needs/Calls for Assist With Assistance/Cues. Outcome: Progressing   Problem: RH PAIN MANAGEMENT Goal: RH STG PAIN MANAGED AT OR BELOW PT'S PAIN GOAL Outcome: Progressing   Problem: RH KNOWLEDGE DEFICIT Goal: RH STG INCREASE KNOWLEDGE OF DIABETES Outcome: Progressing Goal: RH STG INCREASE KNOWLEDGE OF HYPERTENSION Outcome: Progressing Goal: RH STG INCREASE KNOWLEDGE OF DYSPHAGIA/FLUID INTAKE Outcome: Progressing Goal: RH STG INCREASE KNOWLEGDE OF HYPERLIPIDEMIA Outcome: Progressing Goal: RH STG INCREASE KNOWLEDGE OF STROKE PROPHYLAXIS Outcome: Progressing

## 2018-11-14 ENCOUNTER — Inpatient Hospital Stay (HOSPITAL_COMMUNITY): Payer: Medicare HMO

## 2018-11-14 ENCOUNTER — Inpatient Hospital Stay (HOSPITAL_COMMUNITY): Payer: Medicare HMO | Admitting: Speech Pathology

## 2018-11-14 ENCOUNTER — Inpatient Hospital Stay (HOSPITAL_COMMUNITY): Payer: Medicare HMO | Admitting: Occupational Therapy

## 2018-11-14 DIAGNOSIS — I4819 Other persistent atrial fibrillation: Secondary | ICD-10-CM

## 2018-11-14 DIAGNOSIS — R51 Headache: Secondary | ICD-10-CM

## 2018-11-14 DIAGNOSIS — M7061 Trochanteric bursitis, right hip: Secondary | ICD-10-CM

## 2018-11-14 DIAGNOSIS — R519 Headache, unspecified: Secondary | ICD-10-CM

## 2018-11-14 DIAGNOSIS — G939 Disorder of brain, unspecified: Secondary | ICD-10-CM

## 2018-11-14 DIAGNOSIS — I63411 Cerebral infarction due to embolism of right middle cerebral artery: Secondary | ICD-10-CM

## 2018-11-14 DIAGNOSIS — I5023 Acute on chronic systolic (congestive) heart failure: Secondary | ICD-10-CM

## 2018-11-14 HISTORY — DX: Headache, unspecified: R51.9

## 2018-11-14 HISTORY — DX: Trochanteric bursitis, right hip: M70.61

## 2018-11-14 HISTORY — DX: Disorder of brain, unspecified: G93.9

## 2018-11-14 LAB — COMPREHENSIVE METABOLIC PANEL
ALT: 15 U/L (ref 0–44)
AST: 28 U/L (ref 15–41)
Albumin: 3.3 g/dL — ABNORMAL LOW (ref 3.5–5.0)
Alkaline Phosphatase: 67 U/L (ref 38–126)
Anion gap: 7 (ref 5–15)
BUN: 15 mg/dL (ref 8–23)
CO2: 26 mmol/L (ref 22–32)
Calcium: 9 mg/dL (ref 8.9–10.3)
Chloride: 108 mmol/L (ref 98–111)
Creatinine, Ser: 1.46 mg/dL — ABNORMAL HIGH (ref 0.61–1.24)
GFR calc Af Amer: 57 mL/min — ABNORMAL LOW (ref >60)
GFR calc non Af Amer: 49 mL/min — ABNORMAL LOW (ref >60)
Glucose, Bld: 116 mg/dL — ABNORMAL HIGH (ref 70–99)
Potassium: 4 mmol/L (ref 3.5–5.1)
Sodium: 141 mmol/L (ref 135–145)
Total Bilirubin: 0.8 mg/dL (ref 0.3–1.2)
Total Protein: 7 g/dL (ref 6.5–8.1)

## 2018-11-14 LAB — CBC WITH DIFFERENTIAL/PLATELET
Abs Immature Granulocytes: 0.02 10*3/uL (ref 0.00–0.07)
Basophils Absolute: 0 10*3/uL (ref 0.0–0.1)
Basophils Relative: 0 %
Eosinophils Absolute: 0.3 10*3/uL (ref 0.0–0.5)
Eosinophils Relative: 5 %
HCT: 44.6 % (ref 39.0–52.0)
Hemoglobin: 14.8 g/dL (ref 13.0–17.0)
Immature Granulocytes: 0 %
Lymphocytes Relative: 23 %
Lymphs Abs: 1.4 10*3/uL (ref 0.7–4.0)
MCH: 32 pg (ref 26.0–34.0)
MCHC: 33.2 g/dL (ref 30.0–36.0)
MCV: 96.5 fL (ref 80.0–100.0)
Monocytes Absolute: 0.8 10*3/uL (ref 0.1–1.0)
Monocytes Relative: 13 %
Neutro Abs: 3.6 10*3/uL (ref 1.7–7.7)
Neutrophils Relative %: 59 %
Platelets: 141 10*3/uL — ABNORMAL LOW (ref 150–400)
RBC: 4.62 MIL/uL (ref 4.22–5.81)
RDW: 12.8 % (ref 11.5–15.5)
WBC: 6.1 10*3/uL (ref 4.0–10.5)
nRBC: 0 % (ref 0.0–0.2)

## 2018-11-14 MED ORDER — LIDOCAINE 5 % EX PTCH
1.0000 | MEDICATED_PATCH | CUTANEOUS | Status: DC
  Administered 2018-11-14 – 2018-12-01 (×17): 1 via TRANSDERMAL
  Filled 2018-11-14 (×17): qty 1

## 2018-11-14 MED ORDER — CARVEDILOL 12.5 MG PO TABS
12.5000 mg | ORAL_TABLET | Freq: Two times a day (BID) | ORAL | Status: DC
  Administered 2018-11-14 – 2018-12-02 (×36): 12.5 mg via ORAL
  Filled 2018-11-14 (×36): qty 1

## 2018-11-14 MED ORDER — DIVALPROEX SODIUM 125 MG PO DR TAB
125.0000 mg | DELAYED_RELEASE_TABLET | Freq: Two times a day (BID) | ORAL | Status: DC
  Administered 2018-11-14 – 2018-11-19 (×11): 125 mg via ORAL
  Filled 2018-11-14 (×11): qty 1

## 2018-11-14 MED ORDER — ENOXAPARIN SODIUM 30 MG/0.3ML ~~LOC~~ SOLN
30.0000 mg | SUBCUTANEOUS | Status: DC
  Administered 2018-11-14 – 2018-11-20 (×7): 30 mg via SUBCUTANEOUS
  Filled 2018-11-14 (×7): qty 0.3

## 2018-11-14 NOTE — Progress Notes (Signed)
Inpatient Rehabilitation  Patient information reviewed and entered into eRehab system by [place] Shannahan M. Masayo Fera, M.A., CCC/SLP, PPS Coordinator.  Information including medical coding, functional ability and quality indicators will be reviewed and updated through discharge.    

## 2018-11-14 NOTE — Evaluation (Signed)
Physical Therapy Assessment and Plan  Patient Details  Name: Glenn [name] MRN: [id number] Date of Birth: February 13, 1951  PT Diagnosis: Difficulty walking, Dizziness and giddiness, Hemiparesis non-dominant, Hypotonia, Impaired cognition, Impaired sensation, Muscle weakness, Pain in head and R flank and Paralysis Rehab Potential: Fair ELOS: 18-21 days   Today's Date: 11/14/2018 PT Individual Time: 0907-1007 PT Individual Time Calculation (min): 60 min    Problem List:  Patient Active Problem List   Diagnosis Date Noted  . Headache due to intracranial disease 11/14/2018  . Trochanteric bursitis, right hip 11/14/2018  . Cerebral edema ([place]) 11/13/2018  . Hyperlipidemia LDL goal <70 11/13/2018  . Marijuana user 11/13/2018  . Right middle cerebral artery stroke ([place]) 11/13/2018  . Acute CVA (cerebrovascular accident) ([place])   . Noncompliance   . Dysphagia, post-stroke   . Acute on chronic combined systolic and diastolic CHF (congestive heart failure) ([place])   . Wide-complex tachycardia ([place])   . DCM (dilated cardiomyopathy) ([place])   . Atrial fibrillation ([place]) 11/04/2018  . Acute on chronic systolic heart failure ([place])   . Prolonged Q-T interval on ECG   . Elevated troponin   . CHF exacerbation ([place]) 10/30/2018  . Acute respiratory failure with hypoxia ([place])   . Acute kidney injury ([place])   . Entrapment of right ulnar nerve 02/28/2018  . Carpal tunnel syndrome of right wrist 02/28/2018  . Impotence due to erectile dysfunction 09/30/2017  . Solitary pulmonary nodule 06/10/2017  . Neck pain 04/06/2017  . Paroxysmal atrial fibrillation ([name])   . Visit for monitoring Tikosyn therapy 03/25/2017  . ICD (implantable cardioverter-defibrillator) in place 09/13/2016  . Chest pain 09/13/2016  . Tobacco abuse 09/13/2016  . Hospital discharge follow-up 09/13/2016  . Upper back pain 09/13/2016  . Housing problems 09/13/2016  . Persistent atrial fibrillation   . Atrial fibrillation with RVR  ([place])   . Ischemic cardiomyopathy   . Cerebral infarction ([place])   . Stroke (cerebrum) ([name]) Lg L MCA infarct w/ hemorrhagic conversion, embolic d/t AF 71/24/5809  . CHF (congestive heart failure) ([place]) 08/14/2016  . HTN (hypertension) 08/14/2016  . Chronic Hepatitis C  08/14/2016    Past Medical History:  Past Medical History:  Diagnosis Date  . Atrial fibrillation ([place])   . CHF (congestive heart failure) ([place])   . Hepatitis C   . Hypertension   . Stroke ([place])   . Visit for monitoring Tikosyn therapy 03/26/2017   Past Surgical History:  Past Surgical History:  Procedure Laterality Date  . CARDIAC DEFIBRILLATOR PLACEMENT  2015  . CARDIOVERSION N/A 10/10/2016   Procedure: CARDIOVERSION;  Surgeon: Dorothy Spark, MD;  Location: [place];  Service: Cardiovascular;  Laterality: N/A;  . CARDIOVERSION N/A 03/27/2017   Procedure: CARDIOVERSION;  Surgeon: Jerline Pain, MD;  Location: [facility] ENDOSCOPY;  Service: Cardiovascular;  Laterality: N/A;  . CARDIOVERSION N/A 10/29/2018   Procedure: CARDIOVERSION;  Surgeon: Sanda Klein, MD;  Location: [place] ENDOSCOPY;  Service: Cardiovascular;  Laterality: N/A;  . CARDIOVERSION N/A 11/05/2018   Procedure: CARDIOVERSION;  Surgeon: Acie Fredrickson Wonda Cheng, MD;  Location: [facility];  Service: Cardiovascular;  Laterality: N/A;  . EYE SURGERY Left 1990  . IR PERCUTANEOUS ART THROMBECTOMY/INFUSION INTRACRANIAL INC DIAG ANGIO  09/05/2016  . IR RADIOLOGIST EVAL & MGMT  10/03/2016  . RADIOLOGY WITH ANESTHESIA N/A 09/05/2016   Procedure: RADIOLOGY WITH ANESTHESIA;  Surgeon: Luanne Bras, MD;  Location: [facility] [place] [facility];  Service: Radiology;  Laterality: N/A;  . RIGHT/LEFT HEART CATH AND CORONARY ANGIOGRAPHY N/A 11/03/2018  Procedure: RIGHT/LEFT HEART CATH AND CORONARY ANGIOGRAPHY;  Surgeon: Lorretta Harp, MD;  Location: [facility] CV LAB;  Service: Cardiovascular;  Laterality: N/A;    Assessment & Plan Clinical Impression: Patient is a 68 year old right-handed  male with history of atrial fibrillation maintained on Eliquis status post ICD as well as recent cardioversion 06/25/4330, diastolic congestive heart failure with ejection fraction of 15 to 20%, hepatitis C, hypertension  and prior left MCA infarction 2018 status post thrombectomy for left M2 occlusion, tobacco abuse as well as remote history of cocaine and medical noncompliance.  History taken from chart review patient due to dysarthria and lethargy.  Patient lives with a roomate.  1 level home 5 steps to enter.  Reportedly independent working as a Dealer. He has a son in the area.  Presented 11/10/2018 with left hemiparesis and facial droop.  Noted BUN 1.54, COVID negative.  Cranial CT scan showed large right MCA subacute hemorrhage.  Area of hemorrhage measuring approximate 2 x 3 cm centered in the head of the caudate and putamen on the right.  Patient did not receive TPA.  CT angiogram of head and neck showed subtotal occlusion right M1 segment with irregularity most consistent with thrombus.  Patient's Eliquis was reversed with Kcentra.  Echocardiogram with ejection fraction 15-20% Neurology follow-up initially maintained on hypertonic saline later discontinued.  Follow-up cardiology services for nonischemic cardiomyopathy as well as acute on chronic congestive heart failure and currently maintained on Coreg, Lasix, Aldactone as well as Entresto.  Patient currently remains off anticoagulation however subcutaneous heparin was initiated for DVT prophylaxis 11/11/2018.  Patient on dysphagia to thin liquid diet.  Therapy evaluations completed .  Patient transferred to CIR on 11/13/2018 .   Patient currently requires total with mobility secondary to muscle weakness, muscle joint tightness and muscle paralysis, decreased cardiorespiratoy endurance, impaired timing and sequencing, abnormal tone, unbalanced muscle activation, decreased coordination and decreased motor planning, decreased midline orientation and  decreased attention to left, decreased initiation, decreased attention, decreased awareness, decreased problem solving, decreased safety awareness, decreased memory and delayed processing and decreased sitting balance, decreased standing balance, decreased postural control, hemiplegia and decreased balance strategies.  Prior to hospitalization, patient was independent  with mobility and lived with Other (Comment)(roommate, Debbie) in a House home.  Home access is 5Stairs to enter.  Patient will benefit from skilled PT intervention to maximize safe functional mobility, minimize fall risk and decrease caregiver burden for planned discharge home with 24 hour supervision.  Anticipate patient will benefit from follow up [place] at discharge.  PT - End of Session Activity Tolerance: Decreased this session Endurance Deficit: Yes Endurance Deficit Description: Pt very lethargic throuhgout session and maintains eyes closed majority of time. Once up in w/c pt with low BP and confirms dizziness when asked but did not report it himself. BP continues to be low upon OOB  PT Assessment Rehab Potential (ACUTE/IP ONLY): Fair PT Barriers to Discharge: Inaccessible home environment;Decreased caregiver support;Medical stability PT Barriers to Discharge Comments: lives with roommate- reports she is around all the time but need to confirm how must assist she could or is willing to provide. Not sure about other family PT Patient demonstrates impairments in the following area(s): Balance;Endurance;Motor;Pain;Perception;Safety;Sensory;Skin Integrity PT Transfers Functional Problem(s): Bed Mobility;Bed to Chair;Car;Furniture PT Locomotion Functional Problem(s): Ambulation;Stairs;Wheelchair Mobility PT Plan PT Intensity: Minimum of 1-2 x/day ,45 to 90 minutes PT Frequency: 5 out of 7 days PT Duration Estimated Length of Stay: 18-21 days PT Treatment/Interventions: Ambulation/gait training;Balance/vestibular training;Cognitive  remediation/compensation;Community reintegration;Discharge planning;Disease management/prevention;DME/adaptive equipment instruction;Functional mobility training;Neuromuscular re-education;Pain management;Patient/family education;Psychosocial support;Skin care/wound management;Stair training;Splinting/orthotics;Therapeutic Activities;Therapeutic Exercise;UE/LE Strength taining/ROM;UE/LE Coordination activities;Visual/perceptual remediation/compensation;Wheelchair propulsion/positioning PT Transfers Anticipated Outcome(s): min assist  PT Locomotion Anticipated Outcome(s): supervision w/c propulsion; min assist gait PT Recommendation Recommendations for Other Services: Neuropsych consult Follow Up Recommendations: Home health PT;24 hour supervision/assistance Patient destination: Home Equipment Recommended: To be determined;Wheelchair cushion (measurements);Wheelchair (measurements)  Skilled Therapeutic Intervention Evaluation completed (see details above and below) with education on PT POC and goals and individual treatment initiated with focus on functional bed mobility, initiating transfers, postural control retraining and sitting balance with reorientation to midline, and sit <> stand. Pt requires max to total assist for bed mobility with verbal and tactile cues for initiation, sequencing, and attention. Seated EOB pt demonstrate significant postural control impairments including posterior bias and L lateral lean, decreased awareness to positioning of self and decreased overall awareness and attention to LUE/LLE. Attempted sit <> stand from EOB and elevated bed but unable to achieve upright standing despite +2 assist. Max assist squat pivot to w/c to the R with cues for hand placement and technique and +2 for safety. Cues for reciprocal scooting back in w/c. Attempted sit <> stand with stedy (to perform donning of brief) but pt unable to achieve full upright standing position despite patient stating he  was standing. Mirror for visual feedback but pt unable to utilize. Attempted w/c mobility with hemi technique with max assist and hand over hand for correct hand placement of RUE - difficulty with arousal and unable to sustain attention to task. Checked BP (see values below) and ended up returning back to bed due to low BP values. Pt does not report any symptoms throughout session, but when asked after assessing vitals, states he is dizzy. RN and NT made aware.   Pt maintaining eyes closed and lethargic throughout session. Would open eyes (denies visual changes or disturbance) and answer questions intermittently.   PT Evaluation Precautions/Restrictions Precautions Precautions: Fall;ICD/Pacemaker Precaution Comments: L hemi; L inattention Restrictions Weight Bearing Restrictions: No   Vital Signs 118/54 mmHg in w/c 91/36 mmHg in w/c; HR = 47 99/66 mmHg upon return to bed in supine Pain  Reports pain in head and R flank - reports already notified RN and awaiting medication Home Living/Prior Functioning Home Living Living Arrangements: Other (Comment)(Roommate-Debbie) Available Help at Discharge: Family;Available 24 hours/day Type of Home: House Home Access: Stairs to enter [employer] of Steps: 5 Entrance Stairs-Rails: Can reach both Home Layout: One level Bathroom Shower/Tub: Other (comment)  Lives With: Other (Comment)(roommate, Debbie) Prior Function Level of Independence: Independent with gait;Independent with transfers;Independent with basic ADLs  Able to Take Stairs?: Yes Driving: Yes 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) Vision/Perception  Perception Perception: Impaired Inattention/Neglect: Does not attend to left side of body  Cognition Overall Cognitive Status: Impaired/Different from baseline Arousal/Alertness: Lethargic Orientation Level: Oriented to person;Oriented to place;Oriented to situation Attention: Focused Focused Attention: Appears  intact Sustained Attention: Impaired Awareness: Impaired Awareness Impairment: Intellectual impairment Problem Solving: Impaired Safety/Judgment: Impaired Comments: pt very lethargic throughout - difficult to assess cognition vs lethargy Sensation Sensation Light Touch: Impaired Detail Light Touch Impaired Details: Impaired LUE;Impaired LLE Proprioception: Impaired Detail Proprioception Impaired Details: Impaired LUE;Impaired LLE Coordination Gross Motor Movements are Fluid and Coordinated: No Motor  Motor Motor: Hemiplegia;Abnormal tone;Abnormal postural alignment and control Motor - Skilled Clinical Observations: poor postural awareness, tendency for posterior and L lateral bias; decreased L attention  Mobility Bed Mobility Bed Mobility: Rolling Right;Supine to  Sit;Sit to Supine Rolling Right: Maximal Assistance - Patient 25-49% Supine to Sit: Total Assistance - Patient < 25% Sit to Supine: Total Assistance - Patient < 25% Transfers Transfers: Set designer Transfers;Sit to Stand Sit to Stand: 2 Helpers Squat Pivot Transfers: 2 Geologist, engineering / Additional Locomotion Stairs: (unsafe to attempt) Architect: Yes Wheelchair Assistance: Maximal Assistance - Patient 25 - 49% Wheelchair Propulsion: Right upper extremity;Right lower extremity Wheelchair Parts Management: Needs assistance Distance: 2'  Trunk/Postural Assessment  Cervical Assessment Cervical Assessment: Within Functional Limits Thoracic Assessment Thoracic Assessment: Exceptions to WFL(decreased trunk control) Lumbar Assessment Lumbar Assessment: Exceptions to WFL(posterior pelvic tilt) Postural Control Postural Control: Deficits on evaluation Trunk Control: impaired; tendency for posterior and L lateral  bias Righting Reactions: delayed and inadequate  Balance Balance Balance Assessed: Yes Static Sitting Balance Static Sitting - Level of Assistance: 2: Max  assist Dynamic Sitting Balance Dynamic Sitting - Level of Assistance: 1: +1 Total assist Static Standing Balance Static Standing - Level of Assistance: 1: +2 Total assist Dynamic Standing Balance Dynamic Standing - Level of Assistance: Not tested (comment) Extremity Assessment      RLE Assessment RLE Assessment: Exceptions to [facility] General Strength Comments: grossly 4/5 LLE Assessment LLE Assessment: Exceptions to [facility] General Strength Comments: seated EOB able to activate hip flexion, knee extension and ankle DF/PF 3-/5    Refer to Care Plan for Long Term Goals  Recommendations for other services: Neuropsych  Discharge Criteria: Patient will be discharged from PT if patient refuses treatment 3 consecutive times without medical reason, if treatment goals not met, if there is a change in medical status, if patient makes no progress towards goals or if patient is discharged from hospital.  The above assessment, treatment plan, treatment alternatives and goals were discussed and mutually agreed upon: by patient  Juanna Cao, PT, DPT, CBIS  11/14/2018, 1:42 PM

## 2018-11-14 NOTE — Progress Notes (Signed)
Glenn [name] PHYSICAL MEDICINE & REHABILITATION PROGRESS NOTE   Subjective/Complaints: Pt c/o 3 issues- back is itching really badly- also having R sided headaches- pretty much constantly since had stroke.  Also c/o R lateral hip pain that's pretty new- pointed to trochanteric bursa.    Objective:   Mr Glenn Wo Contrast  Result Date: 11/12/2018 CLINICAL DATA:  Follow-up stroke.  Intracranial hemorrhage. EXAM: MRI HEAD WITHOUT CONTRAST TECHNIQUE: Multiplanar, multiecho pulse sequences of the Glenn and surrounding structures were obtained without intravenous contrast. COMPARISON:  Head CT 11/11/2018 and 11/10/2018 FINDINGS: Glenn: Diffusion imaging redemonstrates acute infarction in the anterior portion of the right middle cerebral artery territory and possibly some of the anterior cerebral artery territory, with confluent involvement of the right anterior frontal lobe, frontal operculum region, insula, basal ganglia and frontoparietal junction region. Hemorrhage into the right basal ganglia appears similar, maximal dimension 2 cm. Layering intraventricular blood in the occipital horn of the right lateral ventricle as seen previously. No acute infarction in the left hemisphere or affecting the brainstem or cerebellum. Swelling in the region of infarction, with right-to-left shift measuring up to 8 mm. No ventricular trapping. No extra-axial collection. Vascular: Major vessels at the base of the Glenn show flow. Skull and upper cervical spine: Negative Sinuses/Orbits: Clear/normal Other: None IMPRESSION: No evidence of extension of the right Glenn infarction. Acute infarction affecting the right frontal lobe, frontal operculum, insula and frontoparietal junction region, with involvement of the right basal ganglia. 2 cm hematoma in the right basal ganglia has not enlarged. Intraventricular blood has not increased. Swelling and mass effect, right-to-left shift measuring up to 8 mm, unchanged when measured in  the same location on yesterday's CT. Electronically Signed   By: Glenn [name] M.D.   On: 11/12/2018 16:12   Dg Hip Unilat With Pelvis 2-3 Views Right  Result Date: 11/13/2018 CLINICAL DATA:  Right hip pain. EXAM: DG HIP (WITH OR WITHOUT PELVIS) 2-3V RIGHT COMPARISON:  None. FINDINGS: There is no evidence of hip fracture or dislocation. There is no evidence of arthropathy or other focal bone abnormality. IMPRESSION: Negative. Electronically Signed   By: Glenn [name] M.D.   On: 11/13/2018 15:05   Recent Labs    11/13/18 1709 11/14/18 0556  WBC 8.9 6.1  HGB 15.9 14.8  HCT 47.9 44.6  PLT 157 141*   Recent Labs    11/13/18 0402 11/13/18 1709 11/14/18 0556  NA 141  --  141  K 4.1  --  4.0  CL 108  --  108  CO2 21*  --  26  GLUCOSE 102*  --  116*  BUN 13  --  15  CREATININE 1.38* 1.35* 1.46*  CALCIUM 8.8*  --  9.0    Intake/Output Summary (Last 24 hours) at 11/14/2018 1029 Last data filed at 11/14/2018 0900 Gross per 24 hour  Intake 60 ml  Output 425 ml  Net -365 ml     Physical Exam: Vital Signs Blood pressure (!) 100/58, pulse 73, temperature 98 F (36.7 C), resp. rate 18, weight 87.9 kg, SpO2 95 %.  Vitals and labs reviewed. Constitutional: awake, alert, appropriate, but a little vague- asked me to scratch back, NAD HENT:  Head: Normocephalic.  Eyes:.  ?Difficulty with left gaze Left scleral hemorrhage  Neck: No tracheal deviation present. No thyromegaly present.  Respiratory: Effort normal.  GI: He exhibits no distension.  Musculoskeletal:     Comments: No edema - TTP over R trochanteric bursitis  Neurological:  Lethargic Moderate to severe dysarthria  Follows simple commands. Right facial weakness Right upper extremity: 5/5 proximal distal Right lower extremity: 4-4+/5 proximal distal Left upper extremity: 1+-2-/5 proximal distal Left lower extremity: 3-/5 proximal to distal  Skin: Skin is warm and dry. Very dry, esp on back- skin also felt focally warm  on back Psychiatric: slowed speech; vague  Assessment/Plan: 1. Functional deficits secondary to R MCA infarct with L hemiparesis which require 3+ hours per day of interdisciplinary therapy in a comprehensive inpatient rehab setting.  Physiatrist is providing close team supervision and 24 hour management of active medical problems listed below.  Physiatrist and rehab team continue to assess barriers to discharge/monitor patient progress toward functional and medical goals  Care Tool:  Bathing              Bathing assist       Upper Body Dressing/Undressing Upper body dressing        Upper body assist      Lower Body Dressing/Undressing Lower body dressing            Lower body assist       Toileting Toileting    Toileting assist Assist for toileting: Maximal Assistance - Patient 25 - 49%     Transfers Chair/bed transfer  Transfers assist     Chair/bed transfer assist level: Maximal Assistance - Patient 25 - 49%     Locomotion Ambulation   Ambulation assist              Walk 10 feet activity   Assist           Walk 50 feet activity   Assist           Walk 150 feet activity   Assist           Walk 10 feet on uneven surface  activity   Assist           Wheelchair     Assist               Wheelchair 50 feet with 2 turns activity    Assist            Wheelchair 150 feet activity     Assist          Blood pressure (!) 100/58, pulse 73, temperature 98 F (36.7 C), resp. rate 18, weight 87.9 kg, SpO2 95 %.  1.  Left-sided weakness with facial droop and dysphagia secondary to large right MCA infarction with hemorrhagic conversion, embolic secondary to known A. fib on Eliquis as well as history of left MCA infarction 2018 status post thrombectomy for left M2 occlusion.  Anticoagulation on hold due to hemorrhage             Admit to CIR 2.  Antithrombotics: -DVT/anticoagulation:  Subcutaneous heparin initiated 11/11/2018  9/25- will change to Lovenox             Monitor for bleeding             -antiplatelet therapy: N/A 3. Pain Management: Neurontin 300 mg 3 times daily,tramadol as needed  9/25- will add Depakote 125 mg BID for headache since had new stroke.- will monitor if effective 4. Mood: Valium 5 mg twice daily as needed anxiety             -antipsychotic agents: N/A 5. Neuropsych: This patient is not fully capable of making decisions on his own behalf. 6. Skin/Wound Care: Routine skin checks 7.  Fluids/Electrolytes/Nutrition: Routine I/Os             CMP ordered for tomorrow a.m. 8.  Atrial fibrillation status post ICD.  Follow cardiology services.  Patient with recent cardioversion.  Cardiac rate controlled 9.  Acute on chronic diastolic congestive heart failure.  Lasix 40 mg daily.  Monitor for any signs of fluid overload             Daily weights 10.  Nonischemic cardiomyopathy.  Ejection fraction 15 to 20%.             See #9 11.  Hypertension.  Coreg 25 mg twice daily, Aldactone 12.5 mg daily, Entresto 24-26 mg twice daily  9/25- will decrease Coreg to 12.5 mg BID since BP is running so low/100/58 this AM             Monitor with increased mobility 12.  Hyperlipidemia.  Lipitor 13.  Post stroke dysphagia.  Dysphasia #2 thin liquids.  Follow-up speech therapy 14.  AKI.  Creatinine 1.23-1.54             BMP ordered for tomorrow a.m.  9/25- Cr 1.46- in baseline 15.  History of tobacco/marijuana abuse as well as history of remote cocaine use.  Provide counseling 16.  Medical noncompliance.  Counseling 17.  Chronic hep C.  Follow-up outpatient 18. R trochanteric bursitis-  9/25- will order lidocaine patch for R lateral hip    LOS: 1 days A FACE TO FACE EVALUATION WAS PERFORMED  Yailen Zemaitis 11/14/2018, 10:29 AM

## 2018-11-14 NOTE — Progress Notes (Signed)
Notified MD about patient BP; new orders will initiate. Patient alert and oriented and verbal responsive. Reports he's tired, rates pain 3 out 10, c/o of left hip pain. Will continue to monitor

## 2018-11-14 NOTE — Evaluation (Signed)
Occupational Therapy Assessment and Plan  Patient Details  Name: Glenn [name] MRN: [id number] Date of Birth: 06-Aug-1950  OT Diagnosis: cognitive deficits, disturbance of vision, hemiplegia affecting non-dominant side and muscle weakness (generalized) Rehab Potential: Rehab Potential (ACUTE ONLY): Good ELOS: ~3 weeks   Today's Date: 11/14/2018 OT Individual Time: 1415-1500 OT Individual Time Calculation (min): 45 min     Problem List:  Patient Active Problem List   Diagnosis Date Noted  . Headache due to intracranial disease 11/14/2018  . Trochanteric bursitis, right hip 11/14/2018  . Cerebral edema ([place]) 11/13/2018  . Hyperlipidemia LDL goal <70 11/13/2018  . Marijuana user 11/13/2018  . Right middle cerebral artery stroke ([place]) 11/13/2018  . Acute CVA (cerebrovascular accident) ([place])   . Noncompliance   . Dysphagia, post-stroke   . Acute on chronic combined systolic and diastolic CHF (congestive heart failure) ([place])   . Wide-complex tachycardia ([employer])   . DCM (dilated cardiomyopathy) ([place])   . Atrial fibrillation ([place]) 11/04/2018  . Acute on chronic systolic heart failure ([place])   . Prolonged Q-T interval on ECG   . Elevated troponin   . CHF exacerbation ([place]) 10/30/2018  . Acute respiratory failure with hypoxia ([place])   . Acute kidney injury ([place])   . Entrapment of right ulnar nerve 02/28/2018  . Carpal tunnel syndrome of right wrist 02/28/2018  . Impotence due to erectile dysfunction 09/30/2017  . Solitary pulmonary nodule 06/10/2017  . Neck pain 04/06/2017  . Paroxysmal atrial fibrillation ([name])   . Visit for monitoring Tikosyn therapy 03/25/2017  . ICD (implantable cardioverter-defibrillator) in place 09/13/2016  . Chest pain 09/13/2016  . Tobacco abuse 09/13/2016  . Hospital discharge follow-up 09/13/2016  . Upper back pain 09/13/2016  . Housing problems 09/13/2016  . Persistent atrial fibrillation   . Atrial fibrillation with RVR ([place])   . Ischemic  cardiomyopathy   . Cerebral infarction ([place])   . Stroke (cerebrum) ([name]) Lg L MCA infarct w/ hemorrhagic conversion, embolic d/t AF 81/11/3157  . CHF (congestive heart failure) ([place]) 08/14/2016  . HTN (hypertension) 08/14/2016  . Chronic Hepatitis C  08/14/2016    Past Medical History:  Past Medical History:  Diagnosis Date  . Atrial fibrillation ([place])   . CHF (congestive heart failure) ([place])   . Hepatitis C   . Hypertension   . Stroke ([place])   . Visit for monitoring Tikosyn therapy 03/26/2017   Past Surgical History:  Past Surgical History:  Procedure Laterality Date  . CARDIAC DEFIBRILLATOR PLACEMENT  2015  . CARDIOVERSION N/A 10/10/2016   Procedure: CARDIOVERSION;  Surgeon: Dorothy Spark, MD;  Location: [facility];  Service: Cardiovascular;  Laterality: N/A;  . CARDIOVERSION N/A 03/27/2017   Procedure: CARDIOVERSION;  Surgeon: Jerline Pain, MD;  Location: [facility] ENDOSCOPY;  Service: Cardiovascular;  Laterality: N/A;  . CARDIOVERSION N/A 10/29/2018   Procedure: CARDIOVERSION;  Surgeon: Sanda Klein, MD;  Location: [place] ENDOSCOPY;  Service: Cardiovascular;  Laterality: N/A;  . CARDIOVERSION N/A 11/05/2018   Procedure: CARDIOVERSION;  Surgeon: Acie Fredrickson Wonda Cheng, MD;  Location: [facility];  Service: Cardiovascular;  Laterality: N/A;  . EYE SURGERY Left 1990  . IR PERCUTANEOUS ART THROMBECTOMY/INFUSION INTRACRANIAL INC DIAG ANGIO  09/05/2016  . IR RADIOLOGIST EVAL & MGMT  10/03/2016  . RADIOLOGY WITH ANESTHESIA N/A 09/05/2016   Procedure: RADIOLOGY WITH ANESTHESIA;  Surgeon: Luanne Bras, MD;  Location: [place];  Service: Radiology;  Laterality: N/A;  . RIGHT/LEFT HEART CATH AND CORONARY ANGIOGRAPHY N/A 11/03/2018   Procedure: RIGHT/LEFT  HEART CATH AND CORONARY ANGIOGRAPHY;  Surgeon: Lorretta Harp, MD;  Location: [place] CV LAB;  Service: Cardiovascular;  Laterality: N/A;    Assessment & Plan Clinical Impression: Patient is a 68 y.o. year old male  history of atrial  fibrillation maintained on Eliquis status post ICD as well as recent cardioversion 10/22/5699, diastolic congestive heart failure with ejection fraction of 15 to 20%, hepatitis C, hypertension  and prior left MCA infarction 2018 status post thrombectomy for left M2 occlusion, tobacco abuse as well as remote history of cocaine and medical noncompliance.  History taken from chart review patient due to dysarthria and lethargy.  Patient lives with a roomate.  1 level home 5 steps to enter.  Reportedly independent working as a Dealer. He has a son in the area.  Presented 11/10/2018 with left hemiparesis and facial droop.  Noted BUN 1.54, COVID negative.  Cranial CT scan showed large right MCA subacute hemorrhage.  Area of hemorrhage measuring approximate 2 x 3 cm centered in the head of the caudate and putamen on the right.  Patient did not receive TPA.  CT angiogram of head and neck showed subtotal occlusion right M1 segment with irregularity most consistent with thrombus.  Patient's Eliquis was reversed with Kcentra.  Echocardiogram with ejection fraction 15-20% Neurology follow-up initially maintained on hypertonic saline later discontinued.  Follow-up cardiology services for nonischemic cardiomyopathy as well as acute on chronic congestive heart failure and currently maintained on Coreg, Lasix, Aldactone as well as Entresto.  Patient currently remains off anticoagulation however subcutaneous heparin was initiated for DVT prophylaxis 11/11/2018.  Patient on dysphagia to thin liquid diet.    Patient transferred to CIR on 11/13/2018 .    Patient currently requires max to total A +2  with basic self-care skills and basic mobility  secondary to muscle weakness, decreased cardiorespiratoy endurance, impaired timing and sequencing, unbalanced muscle activation, motor apraxia, decreased coordination and decreased motor planning, decreased visual acuity, decreased visual perceptual skills and blurry vision, decreased midline  orientation, decreased attention to left and decreased motor planning, decreased initiation, decreased attention, decreased awareness, decreased problem solving, decreased safety awareness, decreased memory, delayed processing and lethargy and decreased sitting balance, decreased standing balance, decreased postural control, hemiplegia, decreased balance strategies and difficulty maintaining precautions.  Prior to hospitalization, patient could complete ADL with independent .  Patient will benefit from skilled intervention to decrease level of assist with basic self-care skills and increase independence with basic self-care skills prior to discharge home with care partner.  Anticipate patient will require 24 hour supervision and follow up home health.  OT - End of Session Activity Tolerance: Tolerates 30+ min activity with multiple rests Endurance Deficit: Yes Endurance Deficit Description: Pt very lethargic throuhgout session and maintains eyes closed majority of time. OT Assessment Rehab Potential (ACUTE ONLY): Good OT Barriers to Discharge: Decreased caregiver support OT Patient demonstrates impairments in the following area(s): Balance;Sensory;Cognition;Edema;Endurance;Motor;Pain;Perception;Safety OT Basic ADL's Functional Problem(s): Grooming;Bathing;Dressing;Eating;Toileting OT Transfers Functional Problem(s): Toilet;Tub/Shower OT Additional Impairment(s): Fuctional Use of Upper Extremity OT Plan OT Intensity: Minimum of 1-2 x/day, 45 to 90 minutes OT Frequency: 5 out of 7 days OT Duration/Estimated Length of Stay: ~3 weeks OT Treatment/Interventions: Balance/vestibular training;Disease mangement/prevention;Neuromuscular re-education;Self Care/advanced ADL retraining;Therapeutic Exercise;Cognitive remediation/compensation;DME/adaptive equipment instruction;Pain management;Skin care/wound managment;UE/LE Strength taining/ROM;Community reintegration;Functional electrical  stimulation;Patient/family education;Splinting/orthotics;UE/LE Coordination activities;Discharge planning;Functional mobility training;Psychosocial support;Therapeutic Activities;Visual/perceptual remediation/compensation OT Self Feeding Anticipated Outcome(s): supervision OT Basic Self-Care Anticipated Outcome(s): min A OT Toileting Anticipated Outcome(s): min A OT Bathroom Transfers Anticipated  Outcome(s): min A OT Recommendation Recommendations for Other Services: Neuropsych consult Patient destination: Home Follow Up Recommendations: Home health OT;Outpatient OT Equipment Recommended: To be determined   Skilled Therapeutic Intervention Attempted eval this morning but pt very lethargic and unable to successful get up and out of bed.   Returned in the pm. OT eval initiated with OT goals, purpose and role discussed. Pt continues to present with lethargy. Pt requires max A - total A to come into sitting EOB. Pt with less active movement in left UE noted than in earlier/ previous notes in the chart. Pt performed sit to stands with max A +2; with facilitation and cues pt able to reach upright trunk posture. Attempted ambulation with max A +2 to total A +2 4 steps. Pt able to advance left LE with min A but pushing towards left side requiring total A to remain upright.  Unable to grasp a walker- which was different than what the previous therapy notes read. Pt transferred to the left into the w/c and then on/ off toilet with max A +2. Pt required total A for toileting +2 and exhibited pushing to the left. Returned to the bed with max A +2. Pt immediately asleep.   OT Evaluation Precautions/Restrictions  Precautions Precautions: Fall;ICD/Pacemaker Precaution Comments: L hemi; L inattention Restrictions Weight Bearing Restrictions: No General Chart Reviewed: Yes Family/Caregiver Present: No Vital Signs Therapy Vitals Temp: 100.2 F (37.9 C)(reassess patient and gave tylenol; patient  alert) Temp Source: Oral Pulse Rate: 60 Resp: 18 BP: 106/69 Patient Position (if appropriate): Lying Oxygen Therapy SpO2: 91 % O2 Device: Room Air Pain Pain Assessment Pain Scale: 0-10 Faces Pain Scale: Hurts a little bit Pain Type: Acute pain Pain Location: Hip Pain Orientation: Right Pain Descriptors / Indicators: Aching;Sharp Pain Frequency: Intermittent(with movement ) Pain Onset: On-going Pain Intervention(s): Medication (See eMAR) Multiple Pain Sites: Yes 2nd Pain Site Pain Score: (unable to state - faces scale = 2) Pain Type: Acute pain Pain Location: Head(headache) Pain Orientation: Right Pain Intervention(s): RN made aware Home Living/Prior Functioning Home Living Living Arrangements: Other (Comment)(Roommate-Debbie) Available Help at Discharge: Family, Available 24 hours/day Type of Home: House Home Access: Stairs to enter [employer] of Steps: 5 Entrance Stairs-Rails: Can reach both Home Layout: One level Bathroom Shower/Tub: Other (comment), Tub/shower unit  Lives With: Other (Comment) Prior Function Level of Independence: Independent with gait, Independent with transfers, Independent with basic ADLs  Able to Take Stairs?: Yes Driving: Yes 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) ADL ADL Eating: Minimal assistance Where Assessed-Eating: Bed level Grooming: Moderate assistance Upper Body Bathing: Moderate assistance Where Assessed-Upper Body Bathing: Wheelchair Lower Body Bathing: Maximal assistance Where Assessed-Lower Body Bathing: Wheelchair Upper Body Dressing: Not assessed Lower Body Dressing: Not assessed Toileting: Other (Comment)(+2) Toilet Transfer: Other (comment)(+2) Toilet Transfer Method: Stand pivot, Squat pivot Toilet Transfer Equipment: Energy manager: Not assessed Vision Baseline Vision/History: No visual deficits Patient Visual Report: Blurring of vision Vision Assessment?: Vision impaired- to be  further tested in functional context;Yes Alignment/Gaze Preference: Gaze right Tracking/Visual Pursuits: Impaired - to be further tested in functional context Perception  Perception: Impaired Inattention/Neglect: Does not attend to left side of body Praxis Praxis: Impaired Praxis Impairment Details: Motor planning;Perseveration Cognition Overall Cognitive Status: Impaired/Different from baseline Arousal/Alertness: Lethargic Orientation Level: Person;Place;Situation Person: Oriented Place: Oriented Situation: Oriented Year: 2020 Month: September Day of Week: Incorrect(Thursday) Memory: Impaired Memory Impairment: Storage deficit;Decreased short term memory Decreased Short Term Memory: Verbal basic Immediate Memory Recall: Sock;Blue;Bed Memory  Recall Sock: Not able to recall Memory Recall Blue: Not able to recall Memory Recall Bed: Without Cue Attention: Focused Focused Attention: Appears intact Sustained Attention: Impaired Sustained Attention Impairment: Verbal basic;Functional basic Awareness: Impaired Awareness Impairment: Intellectual impairment Problem Solving: Impaired Problem Solving Impairment: Verbal basic;Functional basic Executive Function: (all impaired due to lower level cognitively) Safety/Judgment: Impaired Comments: pt very lethargic throughout Sensation Sensation Light Touch: Impaired Detail Light Touch Impaired Details: Impaired LUE;Impaired LLE Proprioception: Impaired Detail Proprioception Impaired Details: Impaired LUE;Impaired LLE Coordination Gross Motor Movements are Fluid and Coordinated: No Fine Motor Movements are Fluid and Coordinated: No Finger Nose Finger Test: unable to perform Motor  Motor Motor: Hemiplegia;Abnormal tone;Abnormal postural alignment and control Motor - Skilled Clinical Observations: tendency for posterior and L lateral bias; decreased L attention Mobility  Bed Mobility Bed Mobility: Rolling Right;Supine to Sit;Sit to  Supine Rolling Right: Maximal Assistance - Patient 25-49% Supine to Sit: Total Assistance - Patient < 25% Sit to Supine: Total Assistance - Patient < 25% Transfers Sit to Stand: 2 Helpers  Trunk/Postural Assessment  Cervical Assessment Cervical Assessment: Within Functional Limits Thoracic Assessment Thoracic Assessment: (flexed trunk posture) Lumbar Assessment Lumbar Assessment: (posterior pelvic tilt) Postural Control Postural Control: Deficits on evaluation Trunk Control: impaired; tendency for posterior and L lateral  bias Righting Reactions: delayed and inadequate  Balance Balance Balance Assessed: Yes Static Sitting Balance Static Sitting - Level of Assistance: 3: Mod assist Dynamic Sitting Balance Dynamic Sitting - Level of Assistance: 1: +1 Total assist Static Standing Balance Static Standing - Level of Assistance: 1: +2 Total assist Dynamic Standing Balance Dynamic Standing - Level of Assistance: 1: +2 Total assist Extremity/Trunk Assessment RUE Assessment RUE Assessment: Within Functional Limits LUE Assessment LUE Assessment: Exceptions to [facility] General Strength Comments: Has active shoulder adduction; unable to lift against gravity, unable to use hand - hand very swollen LUE Body System: Neuro Brunstrum levels for arm and hand: Arm Brunstrum level for arm: Stage II Synergy is developing LUE Tone LUE Tone: Modified Ashworth Body Part - Modified Ashworth Scale: Elbow;Fingers;Wrist Elbow - Modified Ashworth Scale for Grading Hypertonia LUE: Slight increase in muscle tone, manifested by a catch and release or by minimal resistance at the end of the range of motion when the affected part(s) is moved in flexion or extension Wrist - Modified [place]worth Scale for Grading Hypertonia LUE: Slight increase in muscle tone, manifested by a catch and release or by minimal resistance at the end of the range of motion when the affected part(s) is moved in flexion or extension Fingers  - Modified Ashworth Scale for Grading Hypertonia LUE: Slight increase in muscle tone, manifested by a catch and release or by minimal resistance at the end of the range of motion when the affected part(s) is moved in flexion or extension     Refer to Care Plan for Long Term Goals  Recommendations for other services: None    Discharge Criteria: Patient will be discharged from OT if patient refuses treatment 3 consecutive times without medical reason, if treatment goals not met, if there is a change in medical status, if patient makes no progress towards goals or if patient is discharged from hospital.  The above assessment, treatment plan, treatment alternatives and goals were discussed and mutually agreed upon: by patient  Nicoletta Ba 11/14/2018, 4:02 PM

## 2018-11-14 NOTE — Evaluation (Signed)
Speech Language Pathology Assessment and Plan  Patient Details  Name: Glenn [name] MRN: [id number] Date of Birth: 1950-03-29  SLP Diagnosis: Dysarthria;Dysphagia;Cognitive Impairments  Rehab Potential: Good ELOS: 18-21 days    Today's Date: 11/14/2018 SLP Individual Time: 3810-1751 SLP Individual Time Calculation (min): 58 min   Problem List:  Patient Active Problem List   Diagnosis Date Noted  . Headache due to intracranial disease 11/14/2018  . Trochanteric bursitis, right hip 11/14/2018  . Cerebral edema ([place]) 11/13/2018  . Hyperlipidemia LDL goal <70 11/13/2018  . Marijuana user 11/13/2018  . Right middle cerebral artery stroke ([place]) 11/13/2018  . Acute CVA (cerebrovascular accident) ([place])   . Noncompliance   . Dysphagia, post-stroke   . Acute on chronic combined systolic and diastolic CHF (congestive heart failure) ([place])   . Wide-complex tachycardia ([place])   . DCM (dilated cardiomyopathy) ([place])   . Atrial fibrillation ([place]) 11/04/2018  . Acute on chronic systolic heart failure ([place])   . Prolonged Q-T interval on ECG   . Elevated troponin   . CHF exacerbation ([place]) 10/30/2018  . Acute respiratory failure with hypoxia ([place])   . Acute kidney injury ([place])   . Entrapment of right ulnar nerve 02/28/2018  . Carpal tunnel syndrome of right wrist 02/28/2018  . Impotence due to erectile dysfunction 09/30/2017  . Solitary pulmonary nodule 06/10/2017  . Neck pain 04/06/2017  . Paroxysmal atrial fibrillation ([name])   . Visit for monitoring Tikosyn therapy 03/25/2017  . ICD (implantable cardioverter-defibrillator) in place 09/13/2016  . Chest pain 09/13/2016  . Tobacco abuse 09/13/2016  . Hospital discharge follow-up 09/13/2016  . Upper back pain 09/13/2016  . Housing problems 09/13/2016  . Persistent atrial fibrillation   . Atrial fibrillation with RVR ([place])   . Ischemic cardiomyopathy   . Cerebral infarction ([place])   . Stroke (cerebrum) ([name]) Lg L MCA infarct w/  hemorrhagic conversion, embolic d/t AF 02/58/5277  . CHF (congestive heart failure) ([place]) 08/14/2016  . HTN (hypertension) 08/14/2016  . Chronic Hepatitis C  08/14/2016   Past Medical History:  Past Medical History:  Diagnosis Date  . Atrial fibrillation ([place])   . CHF (congestive heart failure) ([place])   . Hepatitis C   . Hypertension   . Stroke ([place])   . Visit for monitoring Tikosyn therapy 03/26/2017   Past Surgical History:  Past Surgical History:  Procedure Laterality Date  . CARDIAC DEFIBRILLATOR PLACEMENT  2015  . CARDIOVERSION N/A 10/10/2016   Procedure: CARDIOVERSION;  Surgeon: Dorothy Spark, MD;  Location: [place];  Service: Cardiovascular;  Laterality: N/A;  . CARDIOVERSION N/A 03/27/2017   Procedure: CARDIOVERSION;  Surgeon: Jerline Pain, MD;  Location: [facility] ENDOSCOPY;  Service: Cardiovascular;  Laterality: N/A;  . CARDIOVERSION N/A 10/29/2018   Procedure: CARDIOVERSION;  Surgeon: Sanda Klein, MD;  Location: [place] ENDOSCOPY;  Service: Cardiovascular;  Laterality: N/A;  . CARDIOVERSION N/A 11/05/2018   Procedure: CARDIOVERSION;  Surgeon: Acie Fredrickson Wonda Cheng, MD;  Location: [place];  Service: Cardiovascular;  Laterality: N/A;  . EYE SURGERY Left 1990  . IR PERCUTANEOUS ART THROMBECTOMY/INFUSION INTRACRANIAL INC DIAG ANGIO  09/05/2016  . IR RADIOLOGIST EVAL & MGMT  10/03/2016  . RADIOLOGY WITH ANESTHESIA N/A 09/05/2016   Procedure: RADIOLOGY WITH ANESTHESIA;  Surgeon: Luanne Bras, MD;  Location: [place];  Service: Radiology;  Laterality: N/A;  . RIGHT/LEFT HEART CATH AND CORONARY ANGIOGRAPHY N/A 11/03/2018   Procedure: RIGHT/LEFT HEART CATH AND CORONARY ANGIOGRAPHY;  Surgeon: Lorretta Harp, MD;  Location: [place]  CV LAB;  Service: Cardiovascular;  Laterality: N/A;    Assessment / Plan / Recommendation Clinical Impression   HPI: Glenn [name] is a 68 year old right-handed male with history of atrial fibrillation maintained on Eliquis status post ICD as  well as recent cardioversion 05/23/100, diastolic congestive heart failure with ejection fraction of 15 to 20%, hepatitis C, hypertension  and prior left MCA infarction 2018 status post thrombectomy for left M2 occlusion, tobacco abuse as well as remote history of cocaine and medical noncompliance.  History taken from chart review patient due to dysarthria and lethargy.  Patient lives with a roomate.  1 level home 5 steps to enter.  Reportedly independent working as a Dealer. He has a son in the area.  Presented 11/10/2018 with left hemiparesis and facial droop.  Noted BUN 1.54, COVID negative.  Cranial CT scan showed large right MCA subacute hemorrhage.  Area of hemorrhage measuring approximate 2 x 3 cm centered in the head of the caudate and putamen on the right.  Patient did not receive TPA.  CT angiogram of head and neck showed subtotal occlusion right M1 segment with irregularity most consistent with thrombus.  Patient's Eliquis was reversed with Kcentra.  Echocardiogram with ejection fraction 15-20% Neurology follow-up initially maintained on hypertonic saline later discontinued.  Follow-up cardiology services for nonischemic cardiomyopathy as well as acute on chronic congestive heart failure and currently maintained on Coreg, Lasix, Aldactone as well as Entresto.  Patient currently remains off anticoagulation however subcutaneous heparin was initiated for DVT prophylaxis 11/11/2018.  Patient on dysphagia to thin liquid diet.  Therapy evaluations completed and patient was admitted for a comprehensive rehab program. Pt was admitted to [facility] 11/13/18 and SLP evaluations were completed 11/14/18 with results as follows:  Clinical bedside swallow evaluation: Pt presents with moderate oral dysphagia characterized by left lingual/labial/overall facial weakness. Suspect deficits were exacerbated by pt's lethargy and cognitive impairments, however weakness did result in consistent left sided pocketing of both liquids and  solids. Prolonged and weak/impaired mastication also noted. Max A required for pt to utilize lingual sweep for oral clearance. Recommend continue dysphagia 2 (minced) solid diet, thin liquids, meds crushed in puree. Please provide full supervision during meals to ensure pt is only eating when fully alert and ensure use of swallow strategies.  Cognitive-linguistic evaluation: Pt presents with moderate dysarthria, which reduced speech intelligibility to ~50% at the phrase level throughout evaluations. Pt's son also reported perceived 50% intelligibility rate since hospitalization. Pt also exhibits mod-severe cognitive deficits in basic problem solving, sustained attention, short term memory, and intellectual awareness. Pt scored 11/30 on the [place]. Of note, evaluation was limited due to pt's increasing lethargy throughout session, significantly delayed processing, and decreased working memory.   Pt would benefit from skilled ST while inpatient to treat above listed deficits in the ares of: dysarthria, dysphagia, and cognitive impairment.   Skilled Therapeutic Interventions          Bedside swallow and cognitive linguistic evaluations were completed (please see above for details) and results were reviewed with pt and his son. Max A cues provided for use of swallow precautions during PO intake. Max A for verbal recall, basic problem solving, and sustained attention provided throughout testing and functional tasks.     SLP Assessment  Patient will need skilled Speech Lanaguage Pathology Services during CIR admission    Recommendations  Liquid Administration via: Cup;Straw Medication Administration: Crushed with puree Supervision: Staff to assist with self feeding;Full supervision/cueing for compensatory strategies Compensations:  Monitor for anterior loss;Lingual sweep for clearance of pocketing;Slow rate;Small sips/bites;Minimize environmental distractions Postural Changes and/or Swallow Maneuvers: Seated  upright 90 degrees Oral Care Recommendations: Oral care BID Patient destination: Home Follow up Recommendations: Home Health SLP;24 hour supervision/assistance Equipment Recommended: None recommended by SLP    SLP Frequency 3 to 5 out of 7 days   SLP Duration  SLP Intensity  SLP Treatment/Interventions 18-21 days  Minumum of 1-2 x/day, 30 to 90 minutes  Cognitive remediation/compensation;Environmental controls;Internal/external aids;Speech/Language facilitation;Patient/family education;Functional tasks;Dysphagia/aspiration precaution training;Cueing hierarchy    Pain Pain Assessment Pain Scale: Faces Faces Pain Scale: Hurts a little bit Pain Type: Acute pain Pain Location: Hip Pain Orientation: Right Pain Descriptors / Indicators: Discomfort Pain Onset: On-going Pain Intervention(s): RN made aware Multiple Pain Sites: Yes 2nd Pain Site Pain Score: (unable to state - faces scale = 2) Pain Type: Acute pain Pain Location: Head(headache) Pain Orientation: Right Pain Intervention(s): RN made aware  Prior Functioning Type of Home: House  Lives With: Other (Comment)(roommate, Debbie) Available Help at Discharge: Family;Available 24 hours/day 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 SLP Short Term Goal 2 (Week 1): Pt will demonstrate efficient mastication and oral clearance with upgraded dysphagia 3 trials prior to solid advancement. SLP Short Term Goal 3 (Week 1): Pt will demonstate basic problem solving during familiar functional tasks with Mod A verbal/visual cues. SLP Short Term Goal 4 (Week 1): Pt will sustain attention for ~5 minutes with Mod A cues. SLP Short Term Goal 5 (Week 1): Pt will recall daily information with Mod A for use  of compensatory memory strategies. SLP Short Term Goal 6 (Week 1): Pt will increase speech intelligibility to ~60% at the phrase level and Mod A for use of clear speech strategies.  Refer to Care Plan for Long Term Goals  Recommendations for other services: None   Discharge Criteria: Patient will be discharged from SLP if patient refuses treatment 3 consecutive times without medical reason, if treatment goals not met, if there is a change in medical status, if patient makes no progress towards goals or if patient is discharged from hospital.  The above assessment, treatment plan, treatment alternatives and goals were discussed and mutually agreed upon: by patient and family (son)  Arbutus Leas 11/14/2018, 3:19 PM

## 2018-11-14 NOTE — Discharge Instructions (Signed)
Inpatient Rehab Discharge Instructions  Glenn [name] Discharge date and time: No discharge date for patient encounter.   Activities/Precautions/ Functional Status: Activity: activity as tolerated Diet:  Wound Care: none needed Functional status:  ___ No restrictions     ___ Walk up steps independently ___ 24/7 supervision/assistance   ___ Walk up steps with assistance ___ Intermittent supervision/assistance  ___ Bathe/dress independently ___ Walk with walker     _x__ Bathe/dress with assistance ___ Walk Independently    ___ Shower independently ___ Walk with assistance    ___ Shower with assistance ___ No alcohol     ___ Return to work/school ________  Special Instructions: No driving smoking or alcohol STROKE/TIA DISCHARGE INSTRUCTIONS SMOKING Cigarette smoking nearly doubles your risk of having a stroke & is the single most alterable risk factor  If you smoke or have smoked in the last 12 months, you are advised to quit smoking for your health.  Most of the excess cardiovascular risk related to smoking disappears within a year of stopping.  Ask you doctor about anti-smoking medications  [place] Quit Line: 1-800-QUIT NOW  Free Smoking Cessation Classes [phone]  CHOLESTEROL Know your levels; limit fat & cholesterol in your diet  Lipid Panel     Component Value Date/Time   CHOL 135 11/11/2018 0329   TRIG 75 11/11/2018 0329   HDL 24 (L) 11/11/2018 0329   CHOLHDL 5.6 11/11/2018 0329   VLDL 15 11/11/2018 0329   LDLCALC 96 11/11/2018 0329      Many patients benefit from treatment even if their cholesterol is at goal.  Goal: Total Cholesterol (CHOL) less than 160  Goal:  Triglycerides (TRIG) less than 150  Goal:  HDL greater than 40  Goal:  LDL (LDLCALC) less than 100   BLOOD PRESSURE American Stroke Association blood pressure target is less that 120/80 mm/Hg  Your discharge blood pressure is:  BP: 138/67  Monitor your blood pressure  Limit your salt and  alcohol intake  Many individuals will require more than one medication for high blood pressure  DIABETES (A1c is a blood sugar average for last 3 months) Goal HGBA1c is under 7% (HBGA1c is blood sugar average for last 3 months)  Diabetes: No known diagnosis of diabetes    Lab Results  Component Value Date   HGBA1C 4.9 11/11/2018     Your HGBA1c can be lowered with medications, healthy diet, and exercise.  Check your blood sugar as directed by your physician  Call your physician if you experience unexplained or low blood sugars.  PHYSICAL ACTIVITY/REHABILITATION Goal is 30 minutes at least 4 days per week  Activity: Increase activity slowly, Therapies: Physical Therapy: Home Health Return to work:   Activity decreases your risk of heart attack and stroke and makes your heart stronger.  It helps control your weight and blood pressure; helps you relax and can improve your mood.  Participate in a regular exercise program.  Talk with your doctor about the best form of exercise for you (dancing, walking, swimming, cycling).  DIET/WEIGHT Goal is to maintain a healthy weight  Your discharge diet is:  Diet Order            DIET DYS 2 Room service appropriate? Yes; Fluid consistency: Thin  Diet effective now              liquids Your height is:    Your current weight is: Weight: 87.9 kg Your Body Mass Index (BMI) is:  BMI (Calculated): 30.34  Following the type of diet specifically designed for you will help prevent another stroke.  Your goal weight range is:    Your goal Body Mass Index (BMI) is 19-24.  Healthy food habits can help reduce 3 risk factors for stroke:  High cholesterol, hypertension, and excess weight.  RESOURCES Stroke/Support Group:  Call [phone]   STROKE EDUCATION PROVIDED/REVIEWED AND GIVEN TO PATIENT Stroke warning signs and symptoms How to activate emergency medical system (call 911). Medications prescribed at discharge. Need for follow-up after  discharge. Personal risk factors for stroke. Pneumonia vaccine given:  Flu vaccine given:  My questions have been answered, the writing is legible, and I understand these instructions.  I will adhere to these goals & educational materials that have been provided to me after my discharge from the hospital.      My questions have been answered and I understand these instructions. I will adhere to these goals and the provided educational materials after my discharge from the hospital.  Patient/Caregiver Signature _______________________________ Date __________  Clinician Signature _______________________________________ Date __________  Please bring this form and your medication list with you to all your follow-up doctor's appointments.

## 2018-11-14 NOTE — Progress Notes (Signed)
Social Work Assessment and Plan   Patient Details  Name: Glenn [name] MRN: [id number] Date of Birth: 1950-08-18  Today's Date: 11/14/2018  Problem List:  Patient Active Problem List   Diagnosis Date Noted  . Headache due to intracranial disease 11/14/2018  . Trochanteric bursitis, right hip 11/14/2018  . Cerebral edema ([place]) 11/13/2018  . Hyperlipidemia LDL goal <70 11/13/2018  . Marijuana user 11/13/2018  . Right middle cerebral artery stroke ([place]) 11/13/2018  . Acute CVA (cerebrovascular accident) ([place])   . Noncompliance   . Dysphagia, post-stroke   . Acute on chronic combined systolic and diastolic CHF (congestive heart failure) ([place])   . Wide-complex tachycardia ([place])   . DCM (dilated cardiomyopathy) ([place])   . Atrial fibrillation ([place]) 11/04/2018  . Acute on chronic systolic heart failure ([place])   . Prolonged Q-T interval on ECG   . Elevated troponin   . CHF exacerbation ([place]) 10/30/2018  . Acute respiratory failure with hypoxia ([place])   . Acute kidney injury ([place])   . Entrapment of right ulnar nerve 02/28/2018  . Carpal tunnel syndrome of right wrist 02/28/2018  . Impotence due to erectile dysfunction 09/30/2017  . Solitary pulmonary nodule 06/10/2017  . Neck pain 04/06/2017  . Paroxysmal atrial fibrillation ([name])   . Visit for monitoring Tikosyn therapy 03/25/2017  . ICD (implantable cardioverter-defibrillator) in place 09/13/2016  . Chest pain 09/13/2016  . Tobacco abuse 09/13/2016  . Hospital discharge follow-up 09/13/2016  . Upper back pain 09/13/2016  . Housing problems 09/13/2016  . Persistent atrial fibrillation   . Atrial fibrillation with RVR ([place])   . Ischemic cardiomyopathy   . Cerebral infarction ([place])   . Stroke (cerebrum) ([name]) Lg L MCA infarct w/ hemorrhagic conversion, embolic d/t AF [id number]  . CHF (congestive heart failure) ([place]) 08/14/2016  . HTN (hypertension) 08/14/2016  . Chronic Hepatitis C  08/14/2016   Past Medical History:   Past Medical History:  Diagnosis Date  . Atrial fibrillation ([place])   . CHF (congestive heart failure) ([place])   . Hepatitis C   . Hypertension   . Stroke ([place])   . Visit for monitoring Tikosyn therapy 03/26/2017   Past Surgical History:  Past Surgical History:  Procedure Laterality Date  . CARDIAC DEFIBRILLATOR PLACEMENT  2015  . CARDIOVERSION N/A 10/10/2016   Procedure: CARDIOVERSION;  Surgeon: Dorothy Spark, MD;  Location: [place];  Service: Cardiovascular;  Laterality: N/A;  . CARDIOVERSION N/A 03/27/2017   Procedure: CARDIOVERSION;  Surgeon: Jerline Pain, MD;  Location: [facility] ENDOSCOPY;  Service: Cardiovascular;  Laterality: N/A;  . CARDIOVERSION N/A 10/29/2018   Procedure: CARDIOVERSION;  Surgeon: Sanda Klein, MD;  Location: [place] ENDOSCOPY;  Service: Cardiovascular;  Laterality: N/A;  . CARDIOVERSION N/A 11/05/2018   Procedure: CARDIOVERSION;  Surgeon: Acie Fredrickson Wonda Cheng, MD;  Location: [place];  Service: Cardiovascular;  Laterality: N/A;  . EYE SURGERY Left 1990  . IR PERCUTANEOUS ART THROMBECTOMY/INFUSION INTRACRANIAL INC DIAG ANGIO  09/05/2016  . IR RADIOLOGIST EVAL & MGMT  10/03/2016  . RADIOLOGY WITH ANESTHESIA N/A 09/05/2016   Procedure: RADIOLOGY WITH ANESTHESIA;  Surgeon: Luanne Bras, MD;  Location: [place];  Service: Radiology;  Laterality: N/A;  . RIGHT/LEFT HEART CATH AND CORONARY ANGIOGRAPHY N/A 11/03/2018   Procedure: RIGHT/LEFT HEART CATH AND CORONARY ANGIOGRAPHY;  Surgeon: Lorretta Harp, MD;  Location: [place] CV LAB;  Service: Cardiovascular;  Laterality: N/A;   Social History:  reports that he has been smoking cigarettes. He has been smoking about 0.50  packs per day. He has never used smokeless tobacco. He reports previous alcohol use of about 3.0 standard drinks of alcohol per week. He reports current drug use. Frequency: 2.00 times per week. Drug: Marijuana.  Family / Support Systems Marital Status: Single Patient Roles: Parent, Other  (Comment)(Friend) Children: [name] [name]-son [id number] Other Supports: Debbie-roommate [phone]-cell Anticipated Caregiver: Son unsure at this time-Debbie some Ability/Limitations of Caregiver: Jackelyn Poling doesn't work, son works. Renting a room from [place] for past three months since moved down here Caregiver Availability: Other (Comment)(Need to come up with a plan) Family Dynamics: Son lives locally and Jackelyn Poling are willing to assist but work and Jackelyn Poling thought he would get a Marine scientist at home to do his care. He has only been in [place] since May, moving from [place].  Social History Preferred language: English Religion: Christian Cultural Background: No issues Education: Trade school-mechanic Read: Yes Write: Yes Employment Status: Retired Public relations account executive Issues: No issues Guardian/Conservator: None-according to MD pt is not fully capable of making his own decisions will look toward his son-this is his only child and next of kin.   Abuse/Neglect Abuse/Neglect Assessment Can Be Completed: Yes Physical Abuse: Denies Verbal Abuse: Denies Sexual Abuse: Denies Exploitation of patient/patient's resources: Denies Self-Neglect: Denies  Emotional Status Pt's affect, behavior and adjustment status: Pt wants to be independent when he leaves like he always has been and does not want to burden anyone. He has had some health issues which he has been back and forth to the hospital but did well and remained independent. Recent Psychosocial Issues: other health issues-recent hospitalzation Psychiatric History: No history feel he would benefit from seeing neuro-psych while here for coping due to severity of stroke. Will ask therapy team when appropriate Substance Abuse History: Tobacco was still smoking may quit now. Aware of the resources available to do this  Patient / Family Perceptions, Expectations & Goals Pt/Family understanding of illness & functional limitations: Son, Jackelyn Poling and pt  have a basic understanding of his stroke and deficits. Son has spoken with the MD and feels he understands what the next step is for his rehab. Will ask PA to call son to answer his questions or RN can when son is here later today Premorbid pt/family roles/activities: Father, friend, retiree, etc Anticipated changes in roles/activities/participation: resume Pt/family expectations/goals: Pt states: " I want to do for myself.'  Son states: " I hope he does good there, he is staying here and not going somewhere else."  Jackelyn Poling states: " You are going to get me a nurse for him right?"  [employer]: None Premorbid Home Care/DME Agencies: None Transportation available at discharge: Son Resource referrals recommended: Neuropsychology, Support group (specify)  Discharge Planning Living Arrangements: Other (Comment)(Roommate-Debbie) Support Systems: Children, Friends/neighbors Type of Residence: Private residence Insurance Resources: Multimedia programmer (specify), Medicaid (specify county)([place] medicare) Financial Resources: Radio broadcast assistant Screen Referred: Previously completed Living Expenses: Rent Money Management: Patient Does the patient have any problems obtaining your medications?: No Home Management: Self- Debbie can help Patient/Family Preliminary Plans: Unsure at this time-Debbie not sure if can assist with care, wants a nurse to help him. Son works but can help some. Discussed he may need 24 hr physical care upon discharge from rehab. Very limited fmaily here and has only been here since May 2020 [place] Barriers to Discharge: Decreased caregiver support, Insurance for SNF coverage Sw Barriers to Discharge Comments: Unsure if will have 24 hr care and his [employer] may not cover NHP Social  Work Anticipated Follow Up Needs: HH/OP, SNF, Support Group  Clinical Impression Pt was independent prior to admission and wants to be again. He lives with Jackelyn Poling who he has  known for three months and rents a room from here. His son-only child is local but works. Another woman-Zina called to ask he be moved to another rehab. Son reports this is pt's ex-old lady and not to listen to her or call her. Will work on a realistic discharge plan and only contact son or Debbie-per son's permission. Will benefit from seeing neuro-psych while here.  Elease Hashimoto 11/14/2018, 11:03 AM

## 2018-11-14 NOTE — Care Management Note (Signed)
[facility] Individual Statement of Services  Patient Name:  Glenn [name]  Date:  11/14/2018  Welcome to the [facility].  Our goal is to provide you with an individualized program based on your diagnosis and situation, designed to meet your specific needs.  With this comprehensive rehabilitation program, you will be expected to participate in at least 3 hours of rehabilitation therapies Monday-Friday, with modified therapy programming on the weekends.  Your rehabilitation program will include the following services:  Physical Therapy (PT), Occupational Therapy (OT), Speech Therapy (ST), 24 hour per day rehabilitation nursing, Therapeutic Recreaction (TR), Neuropsychology, Case Management (Social Worker), Rehabilitation Medicine, Nutrition Services and Pharmacy Services  Weekly team conferences will be held on Wednesday to discuss your progress.  Your Social Worker will talk with you frequently to get your input and to update you on team discussions.  Team conferences with you and your family in attendance may also be held.  Expected length of stay: 18-21 days  Overall anticipated outcome: min assist level  Depending on your progress and recovery, your program may change. Your Social Worker will coordinate services and will keep you informed of any changes. Your Social Worker's name and contact numbers are listed  below.  The following services may also be recommended but are not provided by the [facility] will be made to provide these services after discharge if needed.  Arrangements include referral to agencies that provide these services.  Your insurance has been verified to be:  [employer] & Medicaid Your primary doctor is:  [name]  Pertinent information will be shared with your doctor and your  insurance company.  Social Worker:  Ovidio Kin, [place] or ([phone]  Information discussed with and copy given to patient by: Elease Hashimoto, 11/14/2018, 11:07 AM

## 2018-11-15 ENCOUNTER — Inpatient Hospital Stay (HOSPITAL_COMMUNITY): Payer: Medicare HMO | Admitting: Speech Pathology

## 2018-11-15 ENCOUNTER — Inpatient Hospital Stay (HOSPITAL_COMMUNITY): Payer: Medicare HMO

## 2018-11-15 ENCOUNTER — Inpatient Hospital Stay (HOSPITAL_COMMUNITY): Payer: Medicare HMO | Admitting: Occupational Therapy

## 2018-11-15 DIAGNOSIS — I63311 Cerebral infarction due to thrombosis of right middle cerebral artery: Secondary | ICD-10-CM

## 2018-11-15 NOTE — Progress Notes (Signed)
Physical Therapy Session Note  Patient Details  Name: Glenn [name] MRN: [id number] Date of Birth: 1950/03/17  Today's Date: 11/15/2018 PT Individual Time: 1110-1210 and 1407-1430 PT Individual Time Calculation (min): 60 and 23 min   Short Term Goals: Week 1:  PT Short Term Goal 1 (Week 1): Pt will be able to perform bed mobility with mod assist PT Short Term Goal 2 (Week 1): Pt will be able to perform bed <> w/c transfers with mod assist PT Short Term Goal 3 (Week 1): Pt will be able to inititate gait training PT Short Term Goal 4 (Week 1): Pt will be able to propel w/c with hemi technique x 50' with min assist  Skilled Therapeutic Interventions/Progress Updates:     Session 1: Patient in w/c upon PT arrival. Patient alert and agreeable to PT session. Patient reported 12/10 R posterolateral hip pain with tingling during session. Reports that this is not new since admission and has been going on 6-7 months PTA, RN made aware. PT provided repositioning, rest breaks, and distraction as pain interventions throughout session.   Therapeutic Activity: Bed Mobility: Patient performed sit to supine with min A for LE management with the bed flat and without use of bed rails. Provided verbal cues for scooping his L LE with his R LE for decreased assistance with task. Transfers: Patient performed sit to/from stand x1 with min a using the RW, attempted to continue into stand pivot with poor ability for follow cues due to poor motor planning resulting in L LOB with mod A to return to sitting. Performed squat pivot x2 with mod A w/c<>mat table and x1 with max A w/c>bed, increased assist due to fatigue. Provided verbal cues for safe use of RW and hand placement, foot placement prior to standing with manual facilitation for R foot, tends to pulling underneath him, leaning forward for weight shift over his feet during transfers, and hand placement of R hand to facilitate transfers. Provided L hand splint  for RW during session, unable to try hand splint during session due to time constraints.  Wheelchair Mobility:  Patient propelled wheelchair 20 feet with mod A for steering using his R UE only, despite cues and demonstration for using R UE and LE hemi technique. Limited distance due to fatigue and poor motor planning with activity.   Neuromuscular Re-ed: Patient performed static sitting balance on a mat table in front of a mirror x5 min with cues for symmetrical sitting, patient able to make adjustments and identify asymmetries with mod cues from therapist and CGA for balance, progressing to close supervision.  He performed reaching with his R hand x5 outside of BOS with CGA for safety/balance and x3 using his L hand within BOS with decreased AROM and increased time to complete task. He performed lateral leans to his elbow to the R x2 and L x2, reported increased R lateral hip pain when leaning to the L and requested to stop activity at this time.  Performed elbow flexion/extension with tactile cues and PNF tapping using external verbal cues with min A for full ROM. Performed kicking in sitting with L LE 2x10 with visual target for full knee extension.  Patient lethargic and closing his eyes in sitting at end of activities.  Patient in bed at end of session with breaks locked, bed alarm set, and all needs within reach.   Session 2: Patient in bed upon PT arrival. Patient alert and agreeable to PT session at bed level due to  fatigue and R hip pain. Patient reported 10/10 R lateral hip pain during session, RN made aware. PT provided repositioning, rest breaks, and distraction as pain interventions throughout session. Patient stated several strange statements during session, such as "the dog is jumping in the pool" and "I have the worst ear infection, that's why I am here." PT re-oriented the patient with minimal success, RN made aware. PT returned to patient's room and patient did not recognize PT and  started to report what this PT had just spoke with him about. Patient did not demonstrate any signs of confusion during his morning session with this PT. RN made aware.  Therapeutic Exercise: Patient performed the following exercises with verbal and tactile cues for proper technique. -R knee to chest for glut stretch 2x30 seconds -L heel slides 2x10 -L SLR 2x10 Patient reported he felt fatigued and did not want to continue with exercises at this time. Patient missed 7 min of skilled PT due to fatigue.   Patient in bed at end of session with breaks locked, bed alarm set, and all needs within reach. Vitals at end of session: BP 113/94 HR 110 in supine, RN made aware.   Therapy Documentation Precautions:  Precautions Precautions: Fall, ICD/Pacemaker Precaution Comments: L hemi; L inattention Restrictions Weight Bearing Restrictions: No General: PT Amount of Missed Time (min): 7 Minutes PT Missed Treatment Reason: Patient fatigue   Therapy/Group: Individual Therapy  Glenn [name] L Glenn [name] PT, DPT  11/15/2018, 4:51 PM

## 2018-11-15 NOTE — Progress Notes (Signed)
[facility] PHYSICAL MEDICINE & REHABILITATION PROGRESS NOTE   Subjective/Complaints: Still some headaches. Rested well last night. Sleeping when I arrived this morning  ROS: Patient denies fever, rash, sore throat, blurred vision, nausea, vomiting, diarrhea, cough, shortness of breath or chest pain, joint or back pain, headache, or mood change.      Objective:   Dg Hip Unilat With Pelvis 2-3 Views Right  Result Date: 11/13/2018 CLINICAL DATA:  Right hip pain. EXAM: DG HIP (WITH OR WITHOUT PELVIS) 2-3V RIGHT COMPARISON:  None. FINDINGS: There is no evidence of hip fracture or dislocation. There is no evidence of arthropathy or other focal bone abnormality. IMPRESSION: Negative. Electronically Signed   By: Marijo Conception M.D.   On: 11/13/2018 15:05   Recent Labs    11/13/18 1709 11/14/18 0556  WBC 8.9 6.1  HGB 15.9 14.8  HCT 47.9 44.6  PLT 157 141*   Recent Labs    11/13/18 0402 11/13/18 1709 11/14/18 0556  NA 141  --  141  K 4.1  --  4.0  CL 108  --  108  CO2 21*  --  26  GLUCOSE 102*  --  116*  BUN 13  --  15  CREATININE 1.38* 1.35* 1.46*  CALCIUM 8.8*  --  9.0    Intake/Output Summary (Last 24 hours) at 11/15/2018 0905 Last data filed at 11/15/2018 0700 Gross per 24 hour  Intake 360 ml  Output 500 ml  Net -140 ml     Physical Exam: Vital Signs Blood pressure 116/78, pulse 86, temperature 99.5 F (37.5 C), temperature source Oral, resp. rate 20, weight 87.9 kg, SpO2 96 %.  Constitutional: No distress . Vital signs reviewed. HEENT: EOMI, oral membranes moist, left scleral hem Neck: supple Cardiovascular: RRR without murmur. No JVD    Respiratory: CTA Bilaterally without wheezes or rales. Normal effort    GI: BS +, non-tender, non-distended   Musculoskeletal:     Comments: No edema - TTP over R trochanteric bursitis  Neurological:  Lethargic Moderate to severe dysarthria  Follows simple commands. Right facial weakness Right upper extremity: 5/5  proximal distal Right lower extremity: 4-4+/5 proximal distal Left upper extremity: 1+-2-/5 proximal distal Left lower extremity: 3-/5 proximal to distal  Skin: Skin is warm and dry. Dry Psychiatric: slowed speech; vague  Assessment/Plan: 1. Functional deficits secondary to R MCA infarct with L hemiparesis which require 3+ hours per day of interdisciplinary therapy in a comprehensive inpatient rehab setting.  Physiatrist is providing close team supervision and 24 hour management of active medical problems listed below.  Physiatrist and rehab team continue to assess barriers to discharge/monitor patient progress toward functional and medical goals  Care Tool:  Bathing    Body parts bathed by patient: Chest, Abdomen, Face   Body parts bathed by helper: Right arm, Left arm, Front perineal area, Buttocks, Right upper leg, Left upper leg, Right lower leg, Left lower leg     Bathing assist Assist Level: Maximal Assistance - Patient 24 - 49%     Upper Body Dressing/Undressing Upper body dressing   What is the patient wearing?: Hospital gown only    Upper body assist Assist Level: Total Assistance - Patient < 25%    Lower Body Dressing/Undressing Lower body dressing      What is the patient wearing?: Incontinence brief     Lower body assist Assist for lower body dressing: Total Assistance - Patient < 25%     Toileting Toileting  Toileting assist Assist for toileting: 2 Helpers     Transfers Chair/bed transfer  Transfers assist     Chair/bed transfer assist level: 2 Helpers     Locomotion Ambulation   Ambulation assist   Ambulation activity did not occur: Safety/medical concerns(unsafe due to inability to stand; letheray and low BP)          Walk 10 feet activity   Assist  Walk 10 feet activity did not occur: Safety/medical concerns        Walk 50 feet activity   Assist Walk 50 feet with 2 turns activity did not occur: Safety/medical  concerns         Walk 150 feet activity   Assist Walk 150 feet activity did not occur: Safety/medical concerns         Walk 10 feet on uneven surface  activity   Assist Walk 10 feet on uneven surfaces activity did not occur: Safety/medical concerns         Wheelchair     Assist   Type of Wheelchair: Manual    Wheelchair assist level: Total Assistance - Patient < 25% Max wheelchair distance: 2    Wheelchair 50 feet with 2 turns activity    Assist        Assist Level: Dependent - Patient 0%   Wheelchair 150 feet activity     Assist      Assist Level: Dependent - Patient 0%   Blood pressure 116/78, pulse 86, temperature 99.5 F (37.5 C), temperature source Oral, resp. rate 20, weight 87.9 kg, SpO2 96 %.  1.  Left-sided weakness with facial droop and dysphagia secondary to large right MCA infarction with hemorrhagic conversion, embolic secondary to known A. fib on Eliquis as well as history of left MCA infarction 2018 status post thrombectomy for left M2 occlusion.  Anticoagulation on hold due to hemorrhage             -Continue CIR therapies including PT, OT, and SLP  2.  Antithrombotics: -DVT/anticoagulation: Subcutaneous heparin initiated 11/11/2018  9/25- changed to Lovenox             Monitor for bleeding             -antiplatelet therapy: N/A 3. Pain Management: Neurontin 300 mg 3 times daily,tramadol as needed  9/25- will add Depakote 125 mg BID for headache since had new stroke.- ?some early beneift 4. Mood: Valium 5 mg twice daily as needed anxiety             -antipsychotic agents: N/A 5. Neuropsych: This patient is not fully capable of making decisions on his own behalf. 6. Skin/Wound Care: Routine skin checks 7. Fluids/Electrolytes/Nutrition: Routine I/Os              . 8.  Atrial fibrillation status post ICD.  Follow cardiology services.  Patient with recent cardioversion.  Cardiac rate controlled 9.  Acute on chronic diastolic  congestive heart failure.  Lasix 40 mg daily.  Monitor for any signs of fluid overload             Daily weights needed   Filed Weights   11/13/18 1640  Weight: 87.9 kg    10.  Nonischemic cardiomyopathy.  Ejection fraction 15 to 20%.             See #9 11.  Hypertension.  Coreg 25 mg twice daily, Aldactone 12.5 mg daily, Entresto 24-26 mg twice daily  9/25- will decrease Coreg to 12.5  mg BID since BP is running so low/100/58 this AM             9/26--bp better today---monitor 12.  Hyperlipidemia.  Lipitor 13.  Post stroke dysphagia.  Dysphasia #2 thin liquids.  Follow-up speech therapy 14.  AKI.  Creatinine 1.23-1.54                9/25- Cr 1.46- in baseline 15.  History of tobacco/marijuana abuse as well as history of remote cocaine use.  Provide counseling 16.  Medical noncompliance.  Counseling 17.  Chronic hep C.  Follow-up outpatient 18. R trochanteric bursitis-  9/25- ordered lidocaine patch for R lateral hip    LOS: 2 days A FACE TO FACE EVALUATION WAS PERFORMED  Meredith Staggers 11/15/2018, 9:05 AM

## 2018-11-15 NOTE — Progress Notes (Signed)
Speech Language Pathology Daily Session Note  Patient Details  Name: Glenn [name] MRN: [id number] Date of Birth: 1950-03-12  Today's Date: 11/15/2018 SLP Individual Time: [id number] SLP Individual Time Calculation (min): 40 min  Short Term Goals: Week 1: SLP Short Term Goal 1 (Week 1): Pt will consume current dysphagia 2 solid, thin liquid diet with min overt s/s aspiration, efficient mastication and oral clearance with Mod A verbal cues for use of swallow stategies. SLP Short Term Goal 2 (Week 1): Pt will demonstrate efficient mastication and oral clearance with upgraded dysphagia 3 trials prior to solid advancement. SLP Short Term Goal 3 (Week 1): Pt will demonstate basic problem solving during familiar functional tasks with Mod A verbal/visual cues. SLP Short Term Goal 4 (Week 1): Pt will sustain attention for ~5 minutes with Mod A cues. SLP Short Term Goal 5 (Week 1): Pt will recall daily information with Mod A for use of compensatory memory strategies. SLP Short Term Goal 6 (Week 1): Pt will increase speech intelligibility to ~60% at the phrase level and Mod A for use of clear speech strategies.  Skilled Therapeutic Interventions: Skilled treatment session focused on dysphagia and speech goals. Patient was asleep but easily awakened. Patient required Mod verbal cues for use of small bites/sips and Max verbal cues for appropriate mastication with Dys. 2 textures. Minimal and prolonged mastication led to explosive and prolonged coughing episodes, especially with mixed consistencies. Patient also reported difficulty with minced textures, therefore, diet downgraded to Dys. 1 textures. Patient in agreement. Patient also demonstrated moderate-severe dysarthria and was ~50% intelligible at the phrase and sentence level despite Max A for use of speech intelligibility strategies. Patient left upright in bed with alarm on and all needs within reach. Continue with current plan of care.       Pain No/Denies Pain   Therapy/Group: Individual Therapy  Darrel Baroni 11/15/2018, 7:38 AM

## 2018-11-15 NOTE — Progress Notes (Signed)
Occupational Therapy Session Note  Patient Details  Name: Glenn [name] MRN: [id number] Date of Birth: January 21, 1951  Today's Date: 11/15/2018 OT Individual Time: [id number] OT Individual Time Calculation (min): 57 min    Short Term Goals: Week 1:  OT Short Term Goal 1 (Week 1): Pt will don shirt with mod A OT Short Term Goal 2 (Week 1): Pt will don pants with max A sit to stand with one caregiver OT Short Term Goal 3 (Week 1): Pt will perform squat pivot transfer on/ off BSc/ toilet with max A +1 OT Short Term Goal 4 (Week 1): Pt will demonstrate sustained attention to ADL task with min cues.  Skilled Therapeutic Interventions/Progress Updates:    Pt completed transfer from supine to sit on the right side of the bed with mod assist and mod instructional cueing to sequence.  He was able to complete stand pivot transfer to the wheelchair from the bed with max assist to the left.  Had pt work on selfcare retraining sit to stand at the sink.  Pt with right head turn and gaze, requiring max instructional cueing to scan left of midline for locating soap and washcloth.  He perseverated on placing the washcloth in the water repeatedly to the point the therapist began to just set him up with the washcloth ready to wash.  When given instructions to wash his face, he would instead try to place the cloth back in the water, with therapist having to block it.  He needed max instructional cueing to sequence all bathing with mod assist for sit to stand when washing peri area.  Pt with decreased awareness and when standing to wash his buttocks he did not even attempt to squeeze the washcloth out and instead slung water all over him and the floor while trying to wash.  Max assist for all dressing sit to stand.  Pt with proximal movement in the left arm but needed max hand over hand to integrate into washing of the right arm or pulling up right shirt sleeve.  Total assist for donning gripper socks.  Finished session  with call button and phone in reach and pt sitting up in the wheelchair.  Safety alarm belt in place as well.    Therapy Documentation Precautions:  Precautions Precautions: Fall, ICD/Pacemaker Precaution Comments: L hemi; L inattention Restrictions Weight Bearing Restrictions: No   Pain: Pain Assessment Pain Scale: Faces Faces Pain Scale: Hurts a little bit Pain Type: Acute pain Pain Location: Abdomen Pain Orientation: Right Pain Descriptors / Indicators: Discomfort Pain Onset: With Activity Pain Intervention(s): Repositioned ADL: See Care Tool Section for some details of ADL tasks  Therapy/Group: Individual Therapy  Odean Mcelwain OTR/L 11/15/2018, 12:17 PM

## 2018-11-16 ENCOUNTER — Inpatient Hospital Stay (HOSPITAL_COMMUNITY): Payer: Medicare HMO

## 2018-11-16 ENCOUNTER — Inpatient Hospital Stay (HOSPITAL_COMMUNITY): Payer: Medicare HMO | Admitting: Speech Pathology

## 2018-11-16 LAB — URINALYSIS, COMPLETE (UACMP) WITH MICROSCOPIC
Bilirubin Urine: NEGATIVE
Glucose, UA: NEGATIVE mg/dL
Ketones, ur: 5 mg/dL — AB
Leukocytes,Ua: NEGATIVE
Nitrite: POSITIVE — AB
Protein, ur: NEGATIVE mg/dL
Specific Gravity, Urine: 1.015 (ref 1.005–1.030)
pH: 5 (ref 5.0–8.0)

## 2018-11-16 LAB — CBC
HCT: 41.4 % (ref 39.0–52.0)
Hemoglobin: 14 g/dL (ref 13.0–17.0)
MCH: 32.3 pg (ref 26.0–34.0)
MCHC: 33.8 g/dL (ref 30.0–36.0)
MCV: 95.6 fL (ref 80.0–100.0)
Platelets: 159 10*3/uL (ref 150–400)
RBC: 4.33 MIL/uL (ref 4.22–5.81)
RDW: 12.4 % (ref 11.5–15.5)
WBC: 4.2 10*3/uL (ref 4.0–10.5)
nRBC: 0 % (ref 0.0–0.2)

## 2018-11-16 MED ORDER — CIPROFLOXACIN HCL 0.3 % OP SOLN
1.0000 [drp] | Freq: Four times a day (QID) | OPHTHALMIC | Status: DC
  Administered 2018-11-16 – 2018-12-02 (×63): 1 [drp] via OPHTHALMIC
  Filled 2018-11-16 (×2): qty 2.5

## 2018-11-16 NOTE — IPOC Note (Signed)
Overall Plan of Care [facility]) Patient Details Name: Glenn [name] MRN: [id number] DOB: 09-12-1950  Admitting Diagnosis: Stroke (cerebrum) [facility])  Hospital Problems: Principal Problem:   Stroke (cerebrum) ([place]) Lg L MCA infarct w/ hemorrhagic conversion, embolic d/t AF Active Problems:   CHF (congestive heart failure) ([name])   Persistent atrial fibrillation   Right middle cerebral artery stroke ([name])   Headache due to intracranial disease   Trochanteric bursitis, right hip     Functional Problem List: Nursing Behavior, Bladder, Bowel, Edema, Endurance, Medication Management, Nutrition, Safety, Sensory  PT Balance, Endurance, Motor, Pain, Perception, Safety, Sensory, Skin Integrity  OT Balance, Sensory, Cognition, Edema, Endurance, Motor, Pain, Perception, Safety  SLP Cognition, Linguistic, Nutrition, Safety  TR         Basic ADL's: OT Grooming, Bathing, Dressing, Eating, Toileting     Advanced  ADL's: OT       Transfers: PT Bed Mobility, Bed to Chair, Car, Manufacturing systems engineer, Metallurgist: PT Ambulation, Data processing manager, Emergency planning/management officer     Additional Impairments: OT Fuctional Use of Upper Extremity  SLP Swallowing, Communication, Social Cognition expression Problem Solving, Memory, Attention, Social Interaction, Awareness  TR      Anticipated Outcomes Item Anticipated Outcome  Self Feeding supervision  Swallowing  Supervision A   Basic self-care  min A  Toileting  min A   Bathroom Transfers min A  Bowel/Bladder  min assist  Transfers  min assist   Locomotion  supervision w/c propulsion; min assist gait  Communication  Supervision A  Cognition  Min A  Pain  less<2  Safety/Judgment  min assist   Therapy Plan: PT Intensity: Minimum of 1-2 x/day ,45 to 90 minutes PT Frequency: 5 out of 7 days PT Duration Estimated Length of Stay: 18-21 days OT Intensity: Minimum of 1-2 x/day, 45 to 90 minutes OT Frequency: 5 out of 7 days OT  Duration/Estimated Length of Stay: ~3 weeks SLP Intensity: Minumum of 1-2 x/day, 30 to 90 minutes SLP Frequency: 3 to 5 out of 7 days SLP Duration/Estimated Length of Stay: 18-21 days   Due to the current state of emergency, patients may not be receiving their 3-hours of Medicare-mandated therapy.   Team Interventions: Nursing Interventions Disease Management/Prevention, Discharge Planning, Bladder Management, Patient/Family Education, Cognitive Remediation/Compensation, Psychosocial Support, Bowel Management, Medication Management, Dysphagia/Aspiration Precaution Training  PT interventions Ambulation/gait training, Training and development officer, Cognitive remediation/compensation, Community reintegration, Discharge planning, Disease management/prevention, DME/adaptive equipment instruction, Functional mobility training, Neuromuscular re-education, Pain management, Patient/family education, Psychosocial support, Skin care/wound management, Stair training, Splinting/orthotics, Therapeutic Activities, Therapeutic Exercise, UE/LE Strength taining/ROM, UE/LE Coordination activities, Visual/perceptual remediation/compensation, Wheelchair propulsion/positioning  OT Interventions Balance/vestibular training, Disease mangement/prevention, Neuromuscular re-education, Self Care/advanced ADL retraining, Therapeutic Exercise, Cognitive remediation/compensation, DME/adaptive equipment instruction, Pain management, Skin care/wound managment, UE/LE Strength taining/ROM, Community reintegration, Functional electrical stimulation, Patient/family education, Splinting/orthotics, UE/LE Coordination activities, Discharge planning, Functional mobility training, Psychosocial support, Therapeutic Activities, Visual/perceptual remediation/compensation  SLP Interventions Cognitive remediation/compensation, Environmental controls, Internal/external aids, Speech/Language facilitation, Patient/family education, Functional tasks,  Dysphagia/aspiration precaution training, Cueing hierarchy  TR Interventions    SW/CM Interventions Discharge Planning, Psychosocial Support, Patient/Family Education   Barriers to Discharge MD  Medical stability  Nursing      PT Inaccessible home environment, Decreased caregiver support, Medical stability lives with roommate- reports she is around all the time but need to confirm how must assist she could or is willing to provide. Not sure about other family  OT Decreased caregiver support  SLP (severity of deficits)    SW Decreased caregiver support, Insurance for SNF coverage Unsure if will have 24 hr care and his [employer] may not cover NHP   Team Discharge Planning: Destination: PT-Home ,OT- Home , SLP-Home Projected Follow-up: PT-Home health PT, 24 hour supervision/assistance, OT-  Home health OT, Outpatient OT, SLP-Home Health SLP, 24 hour supervision/assistance Projected Equipment Needs: PT-To be determined, Wheelchair cushion (measurements), Wheelchair (measurements), OT- To be determined, SLP-None recommended by SLP Equipment Details: PT- , OT-  Patient/family involved in discharge planning: PT- Patient,  OT-Patient, SLP-Patient, Family member/caregiver  MD ELOS: [phone] Medical Rehab Prognosis:  Excellent Assessment: The patient has been admitted for CIR therapies with the diagnosis of CVA. The team will be addressing functional mobility, strength, stamina, balance, safety, adaptive techniques and equipment, self-care, bowel and bladder mgt, patient and caregiver education, NMR, visual-spatial awareness, cognition, community reentry. Goals have been set at min assist for self-care, min assist for transfers, gait;  Supervision for w/c mobility and supervision/min assist for cognition.   Due to the current state of emergency, patients may not be receiving their 3 hours per day of Medicare-mandated therapy.    Meredith Staggers, MD, FAAPMR      See Team Conference Notes  for weekly updates to the plan of care

## 2018-11-16 NOTE — Plan of Care (Signed)
  Problem: Consults Goal: RH STROKE PATIENT EDUCATION Description: See Patient Education module for education specifics  Outcome: Progressing Goal: Nutrition Consult-if indicated Outcome: Progressing Goal: Diabetes Guidelines if Diabetic/Glucose > 140 Description: If diabetic or lab glucose is > 140 mg/dl - Initiate Diabetes/Hyperglycemia Guidelines & Document Interventions  Outcome: Progressing   Problem: RH BOWEL ELIMINATION Goal: RH STG MANAGE BOWEL WITH ASSISTANCE Description: STG Manage Bowel with min Assistance. Outcome: Progressing Goal: RH STG MANAGE BOWEL W/MEDICATION W/ASSISTANCE Description: STG Manage Bowel with Medication with min Assistance. Outcome: Progressing   Problem: RH BLADDER ELIMINATION Goal: RH STG MANAGE BLADDER WITH ASSISTANCE Description: STG Manage Bladder With min Assistance Outcome: Progressing Goal: RH STG MANAGE BLADDER WITH EQUIPMENT WITH ASSISTANCE Description: STG Manage Bladder With Equipment With min Assistance Outcome: Progressing   Problem: RH SKIN INTEGRITY Goal: RH STG SKIN FREE OF INFECTION/BREAKDOWN Outcome: Progressing Goal: RH STG MAINTAIN SKIN INTEGRITY WITH ASSISTANCE Description: STG Maintain Skin Integrity With mod I Assistance. Outcome: Progressing   Problem: RH SAFETY Goal: RH STG ADHERE TO SAFETY PRECAUTIONS W/ASSISTANCE/DEVICE Description: STG Adhere to Safety Precautions With min Assistance/Device. Outcome: Progressing Goal: RH STG DECREASED RISK OF FALL WITH ASSISTANCE Description: STG Decreased Risk of Fall With min Assistance. Outcome: Progressing   Problem: RH COGNITION-NURSING Goal: RH STG USES MEMORY AIDS/STRATEGIES W/ASSIST TO PROBLEM SOLVE Description: STG Uses Memory Aids/Strategies With min Assistance to Problem Solve. Outcome: Progressing Goal: RH STG ANTICIPATES NEEDS/CALLS FOR ASSIST W/ASSIST/CUES Description: STG Anticipates Needs/Calls for Assist With min Assistance/Cues. Outcome: Progressing    Problem: RH PAIN MANAGEMENT Goal: RH STG PAIN MANAGED AT OR BELOW PT'S PAIN GOAL Description: Less than 3 out of 10 Outcome: Progressing   Problem: RH KNOWLEDGE DEFICIT Goal: RH STG INCREASE KNOWLEDGE OF HYPERTENSION Description: Patient able to state 2 methods in controlling hypertension Outcome: Not Progressing Goal: RH STG INCREASE KNOWLEDGE OF DYSPHAGIA/FLUID INTAKE Description: Patient able to demonstrate appropriate swallowing precautions Outcome: Not Progressing Goal: RH STG INCREASE KNOWLEGDE OF HYPERLIPIDEMIA Description: Patient able to identify medication for hyperlipidemia.  Outcome: Not Progressing Goal: RH STG INCREASE KNOWLEDGE OF STROKE PROPHYLAXIS Description: Patient able to state 2 medications he is on for stroke prevention Outcome: Not Progressing

## 2018-11-16 NOTE — Progress Notes (Addendum)
[place] PHYSICAL MEDICINE & REHABILITATION PROGRESS NOTE   Subjective/Complaints: Lying in bed. No new c/o. Denied headache this morning.   ROS: Limited due to cognitive/behavioral    Objective:   No results found. Recent Labs    11/13/18 1709 11/14/18 0556  WBC 8.9 6.1  HGB 15.9 14.8  HCT 47.9 44.6  PLT 157 141*   Recent Labs    11/13/18 1709 11/14/18 0556  NA  --  141  K  --  4.0  CL  --  108  CO2  --  26  GLUCOSE  --  116*  BUN  --  15  CREATININE 1.35* 1.46*  CALCIUM  --  9.0    Intake/Output Summary (Last 24 hours) at 11/16/2018 1022 Last data filed at 11/16/2018 0410 Gross per 24 hour  Intake 360 ml  Output 550 ml  Net -190 ml     Physical Exam: Vital Signs Blood pressure 116/73, pulse 91, temperature 98.3 F (36.8 C), temperature source Oral, resp. rate 19, weight 87.9 kg, SpO2 98 %.  Constitutional: No distress . Vital signs reviewed. HEENT: EOMI, oral membranes moist, left scleral hemorrhage, ?swelling and erythema Neck: supple Cardiovascular: RRR without murmur. No JVD    Respiratory: CTA Bilaterally without wheezes or rales. Normal effort    GI: BS +, non-tender, non-distended   Musculoskeletal:     Comments: No edema LE's  Neurological:  Awakens, follows basic commands Moderate to severe dysarthria  Follows simple commands. Right facial weakness Right upper extremity: 5/5 proximal distal Right lower extremity: 4-4+/5 proximal distal Left upper extremity: 1+-2-/5 proximal distal Left lower extremity: 3-/5 proximal to distal  Skin: Skin is warm and dry. Dry Psychiatric: delayed, cooperative  Assessment/Plan: 1. Functional deficits secondary to R MCA infarct with L hemiparesis which require 3+ hours per day of interdisciplinary therapy in a comprehensive inpatient rehab setting.  Physiatrist is providing close team supervision and 24 hour management of active medical problems listed below.  Physiatrist and rehab team continue to  assess barriers to discharge/monitor patient progress toward functional and medical goals  Care Tool:  Bathing    Body parts bathed by patient: Chest, Left arm, Abdomen, Right upper leg, Left upper leg, Face   Body parts bathed by helper: Right lower leg, Left lower leg, Front perineal area, Buttocks, Right arm     Bathing assist Assist Level: Moderate Assistance - Patient 50 - 74%     Upper Body Dressing/Undressing Upper body dressing   What is the patient wearing?: Pull over shirt    Upper body assist Assist Level: Maximal Assistance - Patient 25 - 49%    Lower Body Dressing/Undressing Lower body dressing      What is the patient wearing?: Incontinence brief     Lower body assist Assist for lower body dressing: Maximal Assistance - Patient 25 - 49%     Toileting Toileting    Toileting assist Assist for toileting: 2 Helpers     Transfers Chair/bed transfer  Transfers assist     Chair/bed transfer assist level: Maximal Assistance - Patient 25 - 49%     Locomotion Ambulation   Ambulation assist   Ambulation activity did not occur: Safety/medical concerns(unsafe due to inability to stand; letheray and low BP)          Walk 10 feet activity   Assist  Walk 10 feet activity did not occur: Safety/medical concerns        Walk 50 feet activity   Assist  Walk 50 feet with 2 turns activity did not occur: Safety/medical concerns         Walk 150 feet activity   Assist Walk 150 feet activity did not occur: Safety/medical concerns         Walk 10 feet on uneven surface  activity   Assist Walk 10 feet on uneven surfaces activity did not occur: Safety/medical concerns         Wheelchair     Assist   Type of Wheelchair: Manual    Wheelchair assist level: Set up assist, Moderate Assistance - Patient 50 - 74% Max wheelchair distance: 20'    Wheelchair 50 feet with 2 turns activity    Assist        Assist Level:  Dependent - Patient 0%   Wheelchair 150 feet activity     Assist      Assist Level: Dependent - Patient 0%   Blood pressure 116/73, pulse 91, temperature 98.3 F (36.8 C), temperature source Oral, resp. rate 19, weight 87.9 kg, SpO2 98 %.  1.  Left-sided weakness with facial droop and dysphagia secondary to large right MCA infarction with hemorrhagic conversion, embolic secondary to known A. fib on Eliquis as well as history of left MCA infarction 2018 status post thrombectomy for left M2 occlusion.  Anticoagulation on hold due to hemorrhage             -Continue CIR therapies including PT, OT, and SLP  2.  Antithrombotics: -DVT/anticoagulation: Subcutaneous heparin initiated 11/11/2018  9/25- changed to Lovenox             Monitor for bleeding             -antiplatelet therapy: N/A 3. Pain Management: Neurontin 300 mg 3 times daily,tramadol as needed  9/25- will add Depakote 125 mg BID for headache since had new stroke.- seems to be helping--observe for neurosedation however 4. Mood: Valium 5 mg twice daily as needed anxiety             -antipsychotic agents: N/A 5. Neuropsych: This patient is not fully capable of making decisions on his own behalf. 6. Skin/Wound Care: Routine skin checks 7. Fluids/Electrolytes/Nutrition: Routine I/Os              . 8.  Atrial fibrillation status post ICD.  Follow cardiology services.  Patient with recent cardioversion.  Cardiac rate controlled 9.  Acute on chronic diastolic congestive heart failure.  Lasix 40 mg daily.  Monitor for any signs of fluid overload             Daily weights needed   Filed Weights   11/13/18 1640  Weight: 87.9 kg    10.  Nonischemic cardiomyopathy.  Ejection fraction 15 to 20%.             See #9 11.  Hypertension.  Coreg 25 mg twice daily, Aldactone 12.5 mg daily, Entresto 24-26 mg twice daily  9/25- will decrease Coreg to 12.5 mg BID since BP is running so low/100/58 this AM             9/27--bp improved 12.   Hyperlipidemia.  Lipitor 13.  Post stroke dysphagia.  Dysphasia #2 thin liquids.  Follow-up speech therapy 14.  AKI.  Creatinine 1.23-1.54                9/25- Cr 1.46- in baseline 15.  History of tobacco/marijuana abuse as well as history of remote cocaine use.  Provide counseling 16.  Medical noncompliance.  Counseling 17.  Chronic hep C.  Follow-up outpatient 18. R trochanteric bursitis-  9/25- ordered lidocaine patch for R lateral hip  -ROM/Ice 19. Fever: Tmx 101  -check ua/ucx, cxr  -left eye doesn't appear infected but given temp, will treat for blepharitis for now pending urine and chest  -continue to monitor    LOS: 3 days A FACE TO FACE EVALUATION WAS PERFORMED  Meredith Staggers 11/16/2018, 10:22 AM

## 2018-11-16 NOTE — Progress Notes (Signed)
Patient noted to have swelling and redness in left eyelid upon assessment. No drainage noted. Patient denies itching. When asked about eye pain patient points to opposite eye instead. Rt eye WNL. Temp 98.3 this morning but intermittent fevers noted. Lungs diminished. Congested cough intermittently. MD Naaman Plummer notified. Continue to monitor.

## 2018-11-16 NOTE — Progress Notes (Signed)
Speech Language Pathology Daily Session Note  Patient Details  Name: Glenn [name] MRN: [id number] Date of Birth: 1950/12/28  Today's Date: 11/16/2018 SLP Individual Time: 1020-1120 SLP Individual Time Calculation (min): 60 min  Short Term Goals: Week 1: SLP Short Term Goal 1 (Week 1): Pt will consume current dysphagia 2 solid, thin liquid diet with min overt s/s aspiration, efficient mastication and oral clearance with Mod A verbal cues for use of swallow stategies. SLP Short Term Goal 2 (Week 1): Pt will demonstrate efficient mastication and oral clearance with upgraded dysphagia 3 trials prior to solid advancement. SLP Short Term Goal 3 (Week 1): Pt will demonstate basic problem solving during familiar functional tasks with Mod A verbal/visual cues. SLP Short Term Goal 4 (Week 1): Pt will sustain attention for ~5 minutes with Mod A cues. SLP Short Term Goal 5 (Week 1): Pt will recall daily information with Mod A for use of compensatory memory strategies. SLP Short Term Goal 6 (Week 1): Pt will increase speech intelligibility to ~60% at the phrase level and Mod A for use of clear speech strategies.  Skilled Therapeutic Interventions:  Pt was seen for skilled ST targeting goals for cognition and dysphagia.  Pt requested to use the bathroom upon therapist's arrival with mod assist question cues. (Pt did not directly ask to go to the bathroom, he instead indicated that he had trouble with incontinence while waiting for staff members to help him and only mentioned that he needed to have a bowel movement when asked directly by therapist)  He needed max assist instructional cues for intellectual awareness of deficits as he reported that he could get up and walk to the bathroom despite safety plan indicating that he was a max assist transfer and needed a lift.  Pt was able to have a small loose bowel movement on bedpan and assisted with bed mobility with min assist.  Pt needed min verbal cues to  reorient to place as he asked SLP "Is this your home?"  He was independently oriented to situation.  Also question visual hallucinations versus disturbances as he thought there was someone sitting in his wheelchair at the sink in his room.   SLP further facilitated the session with a trial snack of dys 2 textures to continue working towards diet advancement.  Pt had mild left sided buccal residue and anterior loss of boluses for which he needed min verbal cues to correct.  No overt s/s of aspiration with solids or liquids.  Recommend that pt remain on his current diet of dys 1 textures and thin liquids with trials of advanced textures with SLP and full supervision during meals for use of swallowing precautions.  Continue per current plan of care.    Pain Pain Assessment Pain Scale: 0-10 Pain Score: 0-No pain  Therapy/Group: Individual Therapy  Dray Dente, Selinda Orion 11/16/2018, 11:15 AM

## 2018-11-17 ENCOUNTER — Inpatient Hospital Stay (HOSPITAL_COMMUNITY): Payer: Medicare HMO

## 2018-11-17 ENCOUNTER — Inpatient Hospital Stay (HOSPITAL_COMMUNITY): Payer: Medicare HMO | Admitting: Speech Pathology

## 2018-11-17 DIAGNOSIS — R059 Cough, unspecified: Secondary | ICD-10-CM

## 2018-11-17 DIAGNOSIS — I63511 Cerebral infarction due to unspecified occlusion or stenosis of right middle cerebral artery: Secondary | ICD-10-CM

## 2018-11-17 DIAGNOSIS — R05 Cough: Secondary | ICD-10-CM

## 2018-11-17 DIAGNOSIS — R509 Fever, unspecified: Secondary | ICD-10-CM

## 2018-11-17 NOTE — Progress Notes (Signed)
Speech Language Pathology Daily Session Note  Patient Details  Name: Glenn [name] MRN: [id number] Date of Birth: 04/02/1950  Today's Date: 11/17/2018 SLP Individual Time: 0831-0929 SLP Individual Time Calculation (min): 58 min  Short Term Goals: Week 1: SLP Short Term Goal 1 (Week 1): Pt will consume current dysphagia 2 solid, thin liquid diet with min overt s/s aspiration, efficient mastication and oral clearance with Mod A verbal cues for use of swallow stategies. SLP Short Term Goal 2 (Week 1): Pt will demonstrate efficient mastication and oral clearance with upgraded dysphagia 3 trials prior to solid advancement. SLP Short Term Goal 3 (Week 1): Pt will demonstate basic problem solving during familiar functional tasks with Mod A verbal/visual cues. SLP Short Term Goal 4 (Week 1): Pt will sustain attention for ~5 minutes with Mod A cues. SLP Short Term Goal 5 (Week 1): Pt will recall daily information with Mod A for use of compensatory memory strategies. SLP Short Term Goal 6 (Week 1): Pt will increase speech intelligibility to ~60% at the phrase level and Mod A for use of clear speech strategies.  Skilled Therapeutic Interventions: Pt was seen for skilled ST targeting dysphagia and cognition. Of note, pt presented with wet/gurgly vocal quality as well as wheezing/throat clearing at baseline. Pt reported he felt like he had a cold. SLP contacted RN, who reported low grade fever has been ongoing and gave pt Tylenol. During consumption of dysphagia 1 breakfast solids and thin liquids, pt was with intermittent cough (X2) and throat clear (~X4) throughout intake, however it was unclear if tied directly to PO intake given baseline symptoms. He continues to require Mod A verbal cues for efficient mastication (pt otherwise masticates very minimally) and use of lingual sweep to clear oral residue and left pocketing of solids. Following breakfast, pt completed thorough oral care with set up assist  and Min A verbal cues for sequencing. SLP also facilitated session with Mod A verbal and visual cues to sort and count money (ALFA) and initiation throughout task. Pt able to verbalize how to use call bell as remote for TV and contact nurse with Supervision A question cues. Pt was in good spirits, making jokes with SLP throughout session, although speech intelligibility is still significantly decreased - will target in future sessions. Pt was left in bed with alarm set and all needs within reach. Continue per current plan of care.      Pain Pain Assessment Pain Scale: 0-10 Pain Score: 0-No pain  Therapy/Group: Individual Therapy  Arbutus Leas 11/17/2018, 9:34 AM

## 2018-11-17 NOTE — Progress Notes (Signed)
Physical Therapy Session Note  Patient Details  Name: Glenn [name] MRN: [id number] Date of Birth: 20-Oct-1950  Today's Date: 11/17/2018 PT Individual Time: 1305-1420 PT Individual Time Calculation (min): 75 min   Short Term Goals: Week 1:  PT Short Term Goal 1 (Week 1): Pt will be able to perform bed mobility with mod assist PT Short Term Goal 2 (Week 1): Pt will be able to perform bed <> w/c transfers with mod assist PT Short Term Goal 3 (Week 1): Pt will be able to inititate gait training PT Short Term Goal 4 (Week 1): Pt will be able to propel w/c with hemi technique x 50' with min assist  Skilled Therapeutic Interventions/Progress Updates:     Patient in w/c upon PT arrival. Patient alert and agreeable to PT session. Patient reported mild pain in his L eye lid, unable to identify cause, but noted redness and swelling of eye lid, no worse than Saturday when therapist first saw patient. Declined any pain interventions during session.   Therapeutic Activity: Bed Mobility: Patient performed sit to supine with mod A for LE managment. Provided verbal cues for scooping his L LE up with his R to assist lifting LEs into the bed, patient unable to follow cues due to fatigue at end of session. Transfers: Patient performed squat pivot transfers w/c<>NuStep and w/c >bed with min-max A, increased assist required with fatigue. He performed sit to/from stand x4 with mod A of 1 using a R rail and mod A of 2 with B HHA x2 and 3 musketeer technique x1. Provided verbal cues for initiating transfers, hand placement, leaning forward to stance, and foot placement with intermittent manual facilitation for B feet due to poor motor planning. Vitals in sitting: BP 102/62 HR 55 Vitals in standing: BP 138/84 HR 61  Gait Training:  Patient ambulated 14 feet and 10 feet using B HHA x1 and 3 musketeer technique x1 with mod A +2. Ambulated with L trunk lean, L pelvic retraction, L knee flexion in stance, increased  L adduction in swing (able to self correct with visual cue of stepping on therapist's foot), and delayed initiation of L limb advancement. Provided verbal cues for L limb advancement, wide BOS when stepping with L, erect posture with visual cues from mirror, pelvic symmetry, and increased L knee extension in stance.  Wheelchair Mobility:  Patient propelled wheelchair ~80 feet using R UE and LE hemi-technique with min A for steering, patient veers to the L, improved use of R LE this session. Provided verbal cues for coordination of R UE and LE propulsion technique, steering with R LE to prevent veering L, also provided visual targets to the diagonal R to reduce veering L.   Neuromuscular Re-ed: Patient performed static standing in front of a mirror 2x2-3 min with B HHA focusing on body symmetry in standing. Provided verbal cues and manual facilitation for reduced L trunk lean, L knee extension, and L pelvic retraction. Patient able to self correct trunk and pelvic, but would only sustain correction x3-4 seconds before losing attention to L side and looking away from the mirror. Required heavy cues to attend to task with fatigue.  He performed the NuStep 4 min and 30 sec on level 5 resistance using B LEs only with tactile cues for pushing through his L LE and manual facilitation to maintain L hip in neutral, tends to externally rotate in sitting due to weak hip adductors/internal rotators. Provided cues for attending to his L LE throughout.  Patient became lethargic, closing his eyes intermittently and losing attention to task during last minute of activity.   Patient in bed at end of session with breaks locked, bed alarm set, and all needs within reach.    Therapy Documentation Precautions:  Precautions Precautions: Fall, ICD/Pacemaker Precaution Comments: L hemi; L inattention Restrictions Weight Bearing Restrictions: No   Therapy/Group: Individual Therapy  Glenn [name] PT,  DPT  11/17/2018, 4:22 PM

## 2018-11-17 NOTE — Progress Notes (Signed)
Speech Language Pathology Daily Make-up Session Note  Patient Details  Name: Glenn [name] MRN: [id number] Date of Birth: [birth year]  Today's Date: 11/17/2018 SLP Individual Time: [id number] SLP Individual Time Calculation (min): 25 min  Short Term Goals: Week 1: SLP Short Term Goal 1 (Week 1): Pt will consume current dysphagia 2 solid, thin liquid diet with min overt s/s aspiration, efficient mastication and oral clearance with Mod A verbal cues for use of swallow stategies. SLP Short Term Goal 2 (Week 1): Pt will demonstrate efficient mastication and oral clearance with upgraded dysphagia 3 trials prior to solid advancement. SLP Short Term Goal 3 (Week 1): Pt will demonstate basic problem solving during familiar functional tasks with Mod A verbal/visual cues. SLP Short Term Goal 4 (Week 1): Pt will sustain attention for ~5 minutes with Mod A cues. SLP Short Term Goal 5 (Week 1): Pt will recall daily information with Mod A for use of compensatory memory strategies. SLP Short Term Goal 6 (Week 1): Pt will increase speech intelligibility to ~60% at the phrase level and Mod A for use of clear speech strategies.  Skilled Therapeutic Interventions: Skilled treatment session focused on speech goals. SLP facilitated session by providing Mod-Max A verbal cues for use of speech intelligibility strategies at the phrase level during a structured verbal description task to achieve ~75% intelligibility.  Patient left upright in bed with alarm on and all needs within reach. Continue with current plan of care.      Pain No/Denies Pain   Therapy/Group: Individual Therapy  Miklo Aken 11/17/2018, 12:26 PM

## 2018-11-17 NOTE — Progress Notes (Signed)
Occupational Therapy Session Note  Patient Details  Name: Glenn [name] MRN: [id number] Date of Birth: [birth year]  Today's Date: 11/17/2018 OT Individual Time: [id number] OT Individual Time Calculation (min): 70 min    Short Term Goals: Week 1:  OT Short Term Goal 1 (Week 1): Pt will don shirt with mod A OT Short Term Goal 2 (Week 1): Pt will don pants with max A sit to stand with one caregiver OT Short Term Goal 3 (Week 1): Pt will perform squat pivot transfer on/ off BSc/ toilet with max A +1 OT Short Term Goal 4 (Week 1): Pt will demonstrate sustained attention to ADL task with min cues.  Skilled Therapeutic Interventions/Progress Updates:    Pt sleeping in bed upon arrival.  Pt required mod multimodal cues to arouse and open eyes.  L eyelid swollen as noted. Pt agreeable to therapy.  OT intervention with focus on bed mobility, functional transfers, bathing at shower level, dressing with sit<>stand from w/c at sink, attention to L, safety awareness, standing balance, and activity tolerance to increase independence with BADLs. Pt required mod A for supine>sit EOB.  Sitting balance EOB with close supervision.  Stand pivot tranfser to w/c (to his L) with mod A.  Shower transfer with grab bars at min A and max verbal cues for sequencing.  Pt initiates use of LUE in bathing/dressing tasks but requires assistance to complete tasks with LUE.  Pt required mod verbal cues to attend to his L during functional tasks. Sit<>stand with grab bars at min a.  Sit<>stand at sink CGA to mod A with fatigue.  Pt educated on hemi dressing techniques. Pt followed one step commands throughout session with extra time to initiate. Pt remained seated in w/c with belt alarm activated, half lap tray in place, and all needs within reach.    Therapy Documentation Precautions:  Precautions Precautions: Fall, ICD/Pacemaker Precaution Comments: L hemi; L inattention Restrictions Weight Bearing Restrictions: No    Pain: Pt denies pain this morning  Therapy/Group: Individual Therapy  Leroy Libman 11/17/2018, 12:27 PM

## 2018-11-17 NOTE — Progress Notes (Signed)
Glenn [name] PHYSICAL MEDICINE & REHABILITATION PROGRESS NOTE   Subjective/Complaints:   Pt reports "no issues"- but does admit is coughing some when specifically asked.    ROS: Limited due to cognitive/behavioral    Objective:   Dg Chest 2 View  Result Date: 11/17/2018 CLINICAL DATA:  Congestion and cough. EXAM: CHEST - 2 VIEW COMPARISON:  11/16/2018 FINDINGS: Left-sided pacemaker unchanged. Lungs are adequately inflated without consolidation or effusion. Mild cardiomegaly which is stable. Remainder the exam is unchanged. IMPRESSION: No active cardiopulmonary disease. Electronically Signed   By: Marin Olp M.D.   On: 11/17/2018 12:35   Dg Chest 2 View  Result Date: 11/16/2018 CLINICAL DATA:  Stroke last week.  Cough and fever. EXAM: CHEST - 2 VIEW COMPARISON:  November 10, 2018 FINDINGS: Stable AICD device and cardiomegaly. The hila and mediastinum are unchanged. No pulmonary nodules, masses, or focal infiltrates. IMPRESSION: No cause for cough or fever identified. Electronically Signed   By: Dorise Bullion III M.D   On: 11/16/2018 16:48   Recent Labs    11/16/18 1627  WBC 4.2  HGB 14.0  HCT 41.4  PLT 159   No results for input(s): NA, K, CL, CO2, GLUCOSE, BUN, CREATININE, CALCIUM in the last 72 hours.  Intake/Output Summary (Last 24 hours) at 11/17/2018 1311 Last data filed at 11/17/2018 0900 Gross per 24 hour  Intake 220 ml  Output 600 ml  Net -380 ml     Physical Exam: Vital Signs Blood pressure 102/65, pulse 76, temperature 99 F (37.2 C), temperature source Oral, resp. rate 18, weight 84 kg, SpO2 96 %.  Constitutional: No distress . Vital signs and CXR reviewed. Lying on L side, slightly twisted in bed; coughing with coarse audible breathing notable, NAD HEENT: EOMI, oral membranes moist, left scleral hemorrhage, ?swelling and erythema Neck: supple Cardiovascular: RRR without murmur. No JVD    Respiratory: very coarse diffusely, no specific wheezes or rales  heard, but just very junky; adequate air movement B/L    GI: BS +, non-tender, non-distended   Musculoskeletal:     Comments: No edema LE's  Neurological:  Awakens, follows basic commands Moderate to severe dysarthria  Follows simple commands. Right facial weakness Right upper extremity: 5/5 proximal distal Right lower extremity: 4-4+/5 proximal distal Left upper extremity: 1+-2-/5 proximal distal Left lower extremity: 3-/5 proximal to distal  Skin: Skin is warm and dry. Dry Psychiatric: delayed, cooperative  Assessment/Plan: 1. Functional deficits secondary to R MCA infarct with L hemiparesis which require 3+ hours per day of interdisciplinary therapy in a comprehensive inpatient rehab setting.  Physiatrist is providing close team supervision and 24 hour management of active medical problems listed below.  Physiatrist and rehab team continue to assess barriers to discharge/monitor patient progress toward functional and medical goals  Care Tool:  Bathing    Body parts bathed by patient: Chest, Left arm, Abdomen, Front perineal area, Right upper leg, Left upper leg, Right lower leg, Left lower leg   Body parts bathed by helper: Right lower leg, Left lower leg, Front perineal area, Buttocks, Right arm     Bathing assist Assist Level: Moderate Assistance - Patient 50 - 74%     Upper Body Dressing/Undressing Upper body dressing   What is the patient wearing?: Pull over shirt    Upper body assist Assist Level: Maximal Assistance - Patient 25 - 49%    Lower Body Dressing/Undressing Lower body dressing      What is the patient wearing?: Incontinence brief,  Pants     Lower body assist Assist for lower body dressing: Maximal Assistance - Patient 25 - 49%     Toileting Toileting    Toileting assist Assist for toileting: 2 Helpers     Transfers Chair/bed transfer  Transfers assist     Chair/bed transfer assist level: 2 Helpers      Locomotion Ambulation   Ambulation assist   Ambulation activity did not occur: Safety/medical concerns(unsafe due to inability to stand; letheray and low BP)          Walk 10 feet activity   Assist  Walk 10 feet activity did not occur: Safety/medical concerns        Walk 50 feet activity   Assist Walk 50 feet with 2 turns activity did not occur: Safety/medical concerns         Walk 150 feet activity   Assist Walk 150 feet activity did not occur: Safety/medical concerns         Walk 10 feet on uneven surface  activity   Assist Walk 10 feet on uneven surfaces activity did not occur: Safety/medical concerns         Wheelchair     Assist   Type of Wheelchair: Manual    Wheelchair assist level: Set up assist, Moderate Assistance - Patient 50 - 74% Max wheelchair distance: 20'    Wheelchair 50 feet with 2 turns activity    Assist        Assist Level: Dependent - Patient 0%   Wheelchair 150 feet activity     Assist      Assist Level: Dependent - Patient 0%   Blood pressure 102/65, pulse 76, temperature 99 F (37.2 C), temperature source Oral, resp. rate 18, weight 84 kg, SpO2 96 %.  1.  Left-sided weakness with facial droop and dysphagia secondary to large right MCA infarction with hemorrhagic conversion, embolic secondary to known A. fib on Eliquis as well as history of left MCA infarction 2018 status post thrombectomy for left M2 occlusion.  Anticoagulation on hold due to hemorrhage             -Continue CIR therapies including PT, OT, and SLP  2.  Antithrombotics: -DVT/anticoagulation: Subcutaneous heparin initiated 11/11/2018  9/25- changed to Lovenox             Monitor for bleeding             -antiplatelet therapy: N/A 3. Pain Management: Neurontin 300 mg 3 times daily,tramadol as needed  9/25- will add Depakote 125 mg BID for headache since had new stroke.- seems to be helping--observe for neurosedation however 4. Mood:  Valium 5 mg twice daily as needed anxiety             -antipsychotic agents: N/A 5. Neuropsych: This patient is not fully capable of making decisions on his own behalf. 6. Skin/Wound Care: Routine skin checks 7. Fluids/Electrolytes/Nutrition: Routine I/Os              . 8.  Atrial fibrillation status post ICD.  Follow cardiology services.  Patient with recent cardioversion.  Cardiac rate controlled 9.  Acute on chronic diastolic congestive heart failure.  Lasix 40 mg daily.  Monitor for any signs of fluid overload             Daily weights needed   Filed Weights   11/13/18 1640 11/17/18 0358  Weight: 87.9 kg 84 kg    10.  Nonischemic cardiomyopathy.  Ejection fraction 15  to 20%.             See #9 11.  Hypertension.  Coreg 25 mg twice daily, Aldactone 12.5 mg daily, Entresto 24-26 mg twice daily  9/25- will decrease Coreg to 12.5 mg BID since BP is running so low/100/58 this AM             9/27--bp improved 12.  Hyperlipidemia.  Lipitor 13.  Post stroke dysphagia.  Dysphasia #2 thin liquids.  Follow-up speech therapy 14.  AKI.  Creatinine 1.23-1.54                9/25- Cr 1.46- in baseline  9/28- will recheck in AM 15.  History of tobacco/marijuana abuse as well as history of remote cocaine use.  Provide counseling 16.  Medical noncompliance.  Counseling 17.  Chronic hep C.  Follow-up outpatient 18. R trochanteric bursitis-  9/25- ordered lidocaine patch for R lateral hip  -ROM/Ice 19. Fever: Tmx 101  -check ua/ucx, cxr  -left eye doesn't appear infected but given temp, will treat for blepharitis for now pending urine and chest  -continue to monitor  9/28- rechecked CXR since so junky- is (-)    LOS: 4 days A FACE TO FACE EVALUATION WAS PERFORMED  Glenn [name] 11/17/2018, 1:11 PM

## 2018-11-18 ENCOUNTER — Inpatient Hospital Stay (HOSPITAL_COMMUNITY): Payer: Medicare HMO | Admitting: Speech Pathology

## 2018-11-18 ENCOUNTER — Inpatient Hospital Stay (HOSPITAL_COMMUNITY): Payer: Medicare HMO

## 2018-11-18 LAB — URINE CULTURE: Culture: >=100000 — AB

## 2018-11-18 LAB — COMPREHENSIVE METABOLIC PANEL
ALT: 23 U/L (ref 0–44)
AST: 31 U/L (ref 15–41)
Albumin: 3.3 g/dL — ABNORMAL LOW (ref 3.5–5.0)
Alkaline Phosphatase: 74 U/L (ref 38–126)
Anion gap: 10 (ref 5–15)
BUN: 21 mg/dL (ref 8–23)
CO2: 23 mmol/L (ref 22–32)
Calcium: 9.2 mg/dL (ref 8.9–10.3)
Chloride: 107 mmol/L (ref 98–111)
Creatinine, Ser: 1.38 mg/dL — ABNORMAL HIGH (ref 0.61–1.24)
GFR calc Af Amer: >60 mL/min (ref >60)
GFR calc non Af Amer: 53 mL/min — ABNORMAL LOW (ref >60)
Glucose, Bld: 105 mg/dL — ABNORMAL HIGH (ref 70–99)
Potassium: 4.4 mmol/L (ref 3.5–5.1)
Sodium: 140 mmol/L (ref 135–145)
Total Bilirubin: 0.9 mg/dL (ref 0.3–1.2)
Total Protein: 6.9 g/dL (ref 6.5–8.1)

## 2018-11-18 LAB — CBC WITH DIFFERENTIAL/PLATELET
Abs Immature Granulocytes: 0.01 10*3/uL (ref 0.00–0.07)
Basophils Absolute: 0 10*3/uL (ref 0.0–0.1)
Basophils Relative: 0 %
Eosinophils Absolute: 0.3 10*3/uL (ref 0.0–0.5)
Eosinophils Relative: 5 %
HCT: 41.9 % (ref 39.0–52.0)
Hemoglobin: 14.1 g/dL (ref 13.0–17.0)
Immature Granulocytes: 0 %
Lymphocytes Relative: 29 %
Lymphs Abs: 1.8 10*3/uL (ref 0.7–4.0)
MCH: 32 pg (ref 26.0–34.0)
MCHC: 33.7 g/dL (ref 30.0–36.0)
MCV: 95.2 fL (ref 80.0–100.0)
Monocytes Absolute: 0.6 10*3/uL (ref 0.1–1.0)
Monocytes Relative: 10 %
Neutro Abs: 3.3 10*3/uL (ref 1.7–7.7)
Neutrophils Relative %: 56 %
Platelets: 160 10*3/uL (ref 150–400)
RBC: 4.4 MIL/uL (ref 4.22–5.81)
RDW: 12.1 % (ref 11.5–15.5)
WBC: 6 10*3/uL (ref 4.0–10.5)
nRBC: 0 % (ref 0.0–0.2)

## 2018-11-18 MED ORDER — GUAIFENESIN-DM 100-10 MG/5ML PO SYRP
5.0000 mL | ORAL_SOLUTION | ORAL | Status: DC | PRN
  Administered 2018-11-18: 18:00:00 5 mL via ORAL
  Filled 2018-11-18 (×2): qty 5

## 2018-11-18 MED ORDER — CEPHALEXIN 250 MG PO CAPS
500.0000 mg | ORAL_CAPSULE | Freq: Three times a day (TID) | ORAL | Status: DC
  Administered 2018-11-18 – 2018-11-22 (×14): 500 mg via ORAL
  Filled 2018-11-18 (×14): qty 2

## 2018-11-18 MED ORDER — SODIUM CHLORIDE 0.9 % IV SOLN
1.0000 g | INTRAVENOUS | Status: DC
  Filled 2018-11-18: qty 10

## 2018-11-18 NOTE — Progress Notes (Signed)
Orthopedic Tech Progress Note Patient Details:  Glenn [name] 30-Jun-1950 [id number]  Ortho Devices Type of Ortho Device: Abdominal binder       [name] 11/18/2018, 10:28 AM

## 2018-11-18 NOTE — Progress Notes (Signed)
[facility] PHYSICAL MEDICINE & REHABILITATION PROGRESS NOTE   Subjective/Complaints:   Pt has poor initiation per SLP- we wanted ot start Amantadine but unable to, due to prolonged QT -  Pt also still complaining of congestion will order Robitussin. When asked, says L eyelid hurts- has drops, but appears like a sty- will order warm compressed for L eyelid.    ROS: Limited due to cognitive/behavioral    Objective:   Dg Chest 2 View  Result Date: 11/17/2018 CLINICAL DATA:  Congestion and cough. EXAM: CHEST - 2 VIEW COMPARISON:  11/16/2018 FINDINGS: Left-sided pacemaker unchanged. Lungs are adequately inflated without consolidation or effusion. Mild cardiomegaly which is stable. Remainder the exam is unchanged. IMPRESSION: No active cardiopulmonary disease. Electronically Signed   By: Marin Olp M.D.   On: 11/17/2018 12:35   Dg Chest 2 View  Result Date: 11/16/2018 CLINICAL DATA:  Stroke last week.  Cough and fever. EXAM: CHEST - 2 VIEW COMPARISON:  November 10, 2018 FINDINGS: Stable AICD device and cardiomegaly. The hila and mediastinum are unchanged. No pulmonary nodules, masses, or focal infiltrates. IMPRESSION: No cause for cough or fever identified. Electronically Signed   By: Dorise Bullion III M.D   On: 11/16/2018 16:48   Recent Labs    11/16/18 1627 11/18/18 0508  WBC 4.2 6.0  HGB 14.0 14.1  HCT 41.4 41.9  PLT 159 160   Recent Labs    11/18/18 0508  NA 140  K 4.4  CL 107  CO2 23  GLUCOSE 105*  BUN 21  CREATININE 1.38*  CALCIUM 9.2    Intake/Output Summary (Last 24 hours) at 11/18/2018 1250 Last data filed at 11/18/2018 0818 Gross per 24 hour  Intake 238 ml  Output 22 ml  Net 216 ml     Physical Exam: Vital Signs Blood pressure 138/66, pulse 63, temperature 98.8 F (37.1 C), resp. rate 17, weight 84.1 kg, SpO2 95 %.  Constitutional: No distress . Vital signs reviewed and labs Lying in bed- working somewhat with SLP, occ coughing, NAD HEENT: EOMI,  L eyelid appears to have a sty on top Neck: supple Cardiovascular: RRR without murmur. Respiratory: much more CTA B/L today- no upper airway sounds heard; not coarse, No W/R/R    GI: BS +, non-tender, non-distended   Musculoskeletal:     Comments: No edema LE's  Neurological:  Awakens, follows basic commands Moderate to severe dysarthria  Follows simple commands. Right facial weakness Right upper extremity: 5/5 proximal distal Right lower extremity: 4-4+/5 proximal distal Left upper extremity: 1+-2-/5 proximal distal Left lower extremity: 3-/5 proximal to distal  Skin: Skin is warm and dry. Dry Psychiatric: delayed, cooperative; very poor initiation  Assessment/Plan: 1. Functional deficits secondary to R MCA infarct with L hemiparesis which require 3+ hours per day of interdisciplinary therapy in a comprehensive inpatient rehab setting.  Physiatrist is providing close team supervision and 24 hour management of active medical problems listed below.  Physiatrist and rehab team continue to assess barriers to discharge/monitor patient progress toward functional and medical goals  Care Tool:  Bathing    Body parts bathed by patient: Chest, Left arm, Abdomen, Front perineal area, Right upper leg, Left upper leg, Right lower leg, Face   Body parts bathed by helper: Right lower leg, Left lower leg, Front perineal area, Buttocks, Right arm     Bathing assist Assist Level: Moderate Assistance - Patient 50 - 74%     Upper Body Dressing/Undressing Upper body dressing   What  is the patient wearing?: Pull over shirt    Upper body assist Assist Level: Maximal Assistance - Patient 25 - 49%    Lower Body Dressing/Undressing Lower body dressing      What is the patient wearing?: Incontinence brief, Pants     Lower body assist Assist for lower body dressing: Maximal Assistance - Patient 25 - 49%     Toileting Toileting    Toileting assist Assist for toileting: 2 Helpers      Transfers Chair/bed transfer  Transfers assist     Chair/bed transfer assist level: Moderate Assistance - Patient 50 - 74%     Locomotion Ambulation   Ambulation assist   Ambulation activity did not occur: Safety/medical concerns(unsafe due to inability to stand; letheray and low BP)  Assist level: 2 helpers Assistive device: (3 musketeer technique) Max distance: 14'   Walk 10 feet activity   Assist  Walk 10 feet activity did not occur: Safety/medical concerns  Assist level: 2 helpers     Walk 50 feet activity   Assist Walk 50 feet with 2 turns activity did not occur: Safety/medical concerns         Walk 150 feet activity   Assist Walk 150 feet activity did not occur: Safety/medical concerns         Walk 10 feet on uneven surface  activity   Assist Walk 10 feet on uneven surfaces activity did not occur: Safety/medical concerns         Wheelchair     Assist   Type of Wheelchair: Manual    Wheelchair assist level: Minimal Assistance - Patient > 75% Max wheelchair distance: 54'    Wheelchair 50 feet with 2 turns activity    Assist        Assist Level: Minimal Assistance - Patient > 75%   Wheelchair 150 feet activity     Assist      Assist Level: Dependent - Patient 0%   Blood pressure 138/66, pulse 63, temperature 98.8 F (37.1 C), resp. rate 17, weight 84.1 kg, SpO2 95 %.  1.  Left-sided weakness with facial droop and dysphagia secondary to large right MCA infarction with hemorrhagic conversion, embolic secondary to known A. fib on Eliquis as well as history of left MCA infarction 2018 status post thrombectomy for left M2 occlusion.  Anticoagulation on hold due to hemorrhage             -Continue CIR therapies including PT, OT, and SLP  2.  Antithrombotics: -DVT/anticoagulation: Subcutaneous heparin initiated 11/11/2018  9/25- changed to Lovenox             Monitor for bleeding             -antiplatelet therapy:  N/A 3. Pain Management: Neurontin 300 mg 3 times daily,tramadol as needed  9/25- will add Depakote 125 mg BID for headache since had new stroke.- seems to be helping--observe for neurosedation however 4. Mood: Valium 5 mg twice daily as needed anxiety             -antipsychotic agents: N/A 5. Neuropsych: This patient is not fully capable of making decisions on his own behalf. 6. Skin/Wound Care: Routine skin checks 7. Fluids/Electrolytes/Nutrition: Routine I/Os              . 8.  Atrial fibrillation status post ICD.  Follow cardiology services.  Patient with recent cardioversion.  Cardiac rate controlled 9.  Acute on chronic diastolic congestive heart failure.  Lasix 40 mg  daily.  Monitor for any signs of fluid overload             Daily weights needed   Filed Weights   11/13/18 1640 11/17/18 0358 11/18/18 0232  Weight: 87.9 kg 84 kg 84.1 kg    10.  Nonischemic cardiomyopathy.  Ejection fraction 15 to 20%.             See #9 11.  Hypertension.  Coreg 25 mg twice daily, Aldactone 12.5 mg daily, Entresto 24-26 mg twice daily  9/25- will decrease Coreg to 12.5 mg BID since BP is running so low/100/58 this AM             9/27--bp improved  9/29- BP doing OK [id number] systolic 12.  Hyperlipidemia.  Lipitor 13.  Post stroke dysphagia.  Dysphasia #2 thin liquids.  Follow-up speech therapy 14.  AKI.  Creatinine 1.23-1.54                9/25- Cr 1.46- in baseline  9/28- will recheck in AM  9/29- Cr 1.38- which is baseline. 15.  History of tobacco/marijuana abuse as well as history of remote cocaine use.  Provide counseling 16.  Medical noncompliance.  Counseling 17.  Chronic hep C.  Follow-up outpatient 18. R trochanteric bursitis-  9/25- ordered lidocaine patch for R lateral hip  -ROM/Ice 19. Fever: Tmx 101  -check ua/ucx, cxr  -left eye doesn't appear infected but given temp, will treat for blepharitis for now pending urine and chest  -continue to monitor  9/28- rechecked CXR since  so junky- is (-)   9/29- U/A (+) for nitrites- waiting for sensitivities, so can start ABX.; will start warm compressed for L eyelid sty which appeared in last 1-2 days. 20 . Poor initiation  9/29- cannot start Amantadine due to severely prolonged QT interval- will have to wait on med changes.  LOS: 5 days A FACE TO FACE EVALUATION WAS PERFORMED  Edwin Baines 11/18/2018, 12:50 PM

## 2018-11-18 NOTE — Progress Notes (Signed)
Physical Therapy Session Note  Patient Details  Name: Glenn [name] MRN: [id number] Date of Birth: 1950/10/02  Today's Date: 11/18/2018 PT Individual Time: 1335-1400 PT Individual Time Calculation (min): 25 min   Short Term Goals: Week 1:  PT Short Term Goal 1 (Week 1): Pt will be able to perform bed mobility with mod assist PT Short Term Goal 2 (Week 1): Pt will be able to perform bed <> w/c transfers with mod assist PT Short Term Goal 3 (Week 1): Pt will be able to inititate gait training PT Short Term Goal 4 (Week 1): Pt will be able to propel w/c with hemi technique x 50' with min assist  Skilled Therapeutic Interventions/Progress Updates:     Patient in bed with NT assisting patient to eat lunch, as lunch tray had just arrived upon PT arrival. Patient requested to finish lunch with the NT prior to staring PT. Following lunch the IV team arrived to manage one of the patient's lines. Patient missed 35 min of skilled PT due to finishing lunch and IV management. Patient asleep upon PT entry and breathing heavily. Patient non-responsive to verbal or tactile cues and did not become aroused until he was sitting upright on the EOB. Patient reported 4/10 intermittent R hip pain during session.. PT provided repositioning, rest breaks, and distraction as pain interventions throughout session.   Therapeutic Activity: Bed Mobility: Patient performed supine to sit with max-total A due to lethargy at beginning of session. Provided verbal cues for pushing up to assist with sitting EOB. Patient much more alert and agreeable to PT session sitting EOB. Transfers: Patient performed a stand pivot transfer with B HHA with mod A and cues for sequencing and foot placement with manual facilitation for weight shifts. He performed sit to/from stand x1 with mod A using the RW with a L hand splint. Provided verbal cues for foot placement prior to standing, leaning forward to stand, and pushing up from the w/c  rather than pulling on the RW when standing for safety.  Gait Training:  Patient ambulated 14 feet using RW and L hand splint with mod-max A, increased with fatigue. Ambulated with decreased weight shift to the L, L lateral trunk lean, trunk flexed, delayed advancement of L LE, decreased stance time on L, and increased L knee flexion in stance. Provided verbal cues for erect posture, shifting weight onto his L LE with manual facilitation, L LE advancement with visual cues for increased BOS when stepping, and manual facilitation for L trunk lean, worse with fatigue.  Patient in w/c at end of session with breaks locked, seat belt alarm set, and all needs within reach.    Therapy Documentation Precautions:  Precautions Precautions: Fall, ICD/Pacemaker Precaution Comments: L hemi; L inattention Restrictions Weight Bearing Restrictions: No General: PT Amount of Missed Time (min): 35 Minutes PT Missed Treatment Reason: Nursing care;Other (Comment)(Eating lunch and IV removal by IV team)    Therapy/Group: Individual Therapy  Jorge Amparo L Elihue Ebert PT, DPT  11/18/2018, 4:55 PM

## 2018-11-18 NOTE — Progress Notes (Signed)
Speech Language Pathology Daily Session Note  Patient Details  Name: KYPTON ARCIA MRN: [id number] Date of Birth: 02-Nov-1950  Today's Date: 11/18/2018 SLP Individual Time: 0730-0829 SLP Individual Time Calculation (min): 59 min  Short Term Goals: Week 1: SLP Short Term Goal 1 (Week 1): Pt will consume current dysphagia 2 solid, thin liquid diet with min overt s/s aspiration, efficient mastication and oral clearance with Mod A verbal cues for use of swallow stategies. SLP Short Term Goal 2 (Week 1): Pt will demonstrate efficient mastication and oral clearance with upgraded dysphagia 3 trials prior to solid advancement. SLP Short Term Goal 3 (Week 1): Pt will demonstate basic problem solving during familiar functional tasks with Mod A verbal/visual cues. SLP Short Term Goal 4 (Week 1): Pt will sustain attention for ~5 minutes with Mod A cues. SLP Short Term Goal 5 (Week 1): Pt will recall daily information with Mod A for use of compensatory memory strategies. SLP Short Term Goal 6 (Week 1): Pt will increase speech intelligibility to ~60% at the phrase level and Mod A for use of clear speech strategies.  Skilled Therapeutic Interventions: Pt was seen for skilled ST targeting dysphagia and cognition. Pt is very cooperative and responsive to cues, however continues to exhibit very poor initiation, which limiting his participation in therapies to some degree. During skilled observation of pt's consumption of current dysphagia 1 breakfast with thin liquids, pt exhibited cough X2 after sips of thin from straw and minimal mastication. Min anterior loss of solids and left pocketing observed, although pt is using liquid wash to assist with oral clearance with Min-Mod A verbal cues. Increased verbal cues required for awareness of left pocketing and use of lingual sweep. Of note, pt still presents with baseline congestion and gurgly/audible respirations, however yesterday's CXR (11/17/18) was unremarkable;  MD arrived during session and reported pt's s/sx consistent with upper respiratory common cold. At this time SLP does not believe there is a need for repeat instrumental swallow assessment, however will continue to monitor. During upgraded trials of dysphagia 2 solids, pt did demonstrate increased and more efficient mastication with Mod A verbal cues, however he became quickly fatigued (only consumed ~4 bites). Continue current Dys 1 diet, but ST will continue upgraded trials to work toward solid advancement. SLP also facilitated session with Mod A verbal question cues for sequencing steps during functional tasks, such as brushing teeth. Pt also completed a basic 3-step action card sequencing task with Mod faded to Stebbins A verbal cues for sequencing and logic. Pt unable to verbally recall speech intelligibility strategies introduced in prior session, therefore SLP assisted with recall and posted visual aid in room for pt's reference/use throughout the day. Pt was left in bed with alarm set and all needs within reach. Continue per current plan of care.      Pain Pain Assessment Pain Score: 0-No pain  Therapy/Group: Individual Therapy  Arbutus Leas 11/18/2018, 9:33 AM

## 2018-11-18 NOTE — Progress Notes (Signed)
Occupational Therapy Session Note  Patient Details  Name: Glenn [name] MRN: [id number] Date of Birth: [birth year]  Today's Date: 11/18/2018 OT Individual Time: 0930-1040 OT Individual Time Calculation (min): 70 min    Short Term Goals: Week 1:  OT Short Term Goal 1 (Week 1): Pt will don shirt with mod A OT Short Term Goal 2 (Week 1): Pt will don pants with max A sit to stand with one caregiver OT Short Term Goal 3 (Week 1): Pt will perform squat pivot transfer on/ off BSc/ toilet with max A +1 OT Short Term Goal 4 (Week 1): Pt will demonstrate sustained attention to ADL task with min cues.  Skilled Therapeutic Interventions/Progress Updates:    Pt resting in bed upon arrival and greeted therapist appropriately.  Pt agreeable to therapy. OT intervention with focus on BADL retraininig, bed mobility, sitting balance, functional transfers, sit<>stand, standing balance, LUE forced use, task initiation, sequencing, and safety awareness to increase independence with BADLs. Supine>sit EOB-mod A Sitting balance-CGA Functional transfers-mod A for squat pivot tranfser to w/c and min A for stand pivot transfer in shower with use of grab bars Bathing-mod A Dressing-mod A/max A overall Standing balance-min A with max verbal cues for weight shifts and L kneed extension Pt required min verbal cues for sequencing and initiation Attempted to engage LUE NMR but pt unable to keep eyes open. Pt returned to room and performed squat pivot transfer to bed with min A.  Pt remained in bed with all needs within reach and bed alarm activated.   Therapy Documentation Precautions:  Precautions Precautions: Fall, ICD/Pacemaker Precaution Comments: L hemi; L inattention Restrictions Weight Bearing Restrictions: No Pain: Pain Assessment Pain Score: 0-No pain   Therapy/Group: Individual Therapy  Leroy Libman 11/18/2018, 10:43 AM

## 2018-11-19 ENCOUNTER — Inpatient Hospital Stay (HOSPITAL_COMMUNITY): Payer: Medicare HMO | Admitting: Speech Pathology

## 2018-11-19 ENCOUNTER — Inpatient Hospital Stay (HOSPITAL_COMMUNITY): Payer: Medicare HMO

## 2018-11-19 ENCOUNTER — Encounter (HOSPITAL_COMMUNITY): Payer: Medicare HMO | Admitting: Psychology

## 2018-11-19 ENCOUNTER — Encounter (HOSPITAL_COMMUNITY): Payer: Medicare HMO

## 2018-11-19 LAB — GLUCOSE, CAPILLARY: Glucose-Capillary: 117 mg/dL — ABNORMAL HIGH (ref 70–99)

## 2018-11-19 MED ORDER — DIVALPROEX SODIUM 250 MG PO DR TAB
250.0000 mg | DELAYED_RELEASE_TABLET | Freq: Two times a day (BID) | ORAL | Status: DC
  Administered 2018-11-19 – 2018-11-23 (×8): 250 mg via ORAL
  Filled 2018-11-19 (×9): qty 1

## 2018-11-19 MED ORDER — MODAFINIL 100 MG PO TABS
100.0000 mg | ORAL_TABLET | Freq: Every day | ORAL | Status: DC
  Administered 2018-11-19 – 2018-11-20 (×2): 100 mg via ORAL
  Filled 2018-11-19 (×2): qty 1

## 2018-11-19 NOTE — Progress Notes (Signed)
[facility] PHYSICAL MEDICINE & REHABILITATION PROGRESS NOTE   Subjective/Complaints:   Pt reports L eyelid still painful but better. Can't start Ritalin for attention due to QT prolongation- will start Provigil since doesn't affect QT interval per side effect list.  Per SLP, pt needs constant cues to eat/even CHEW! Needs assistance with attention as well as initiation.    ROS: Limited due to cognitive/behavioral    Objective:   No results found. Recent Labs    11/16/18 1627 11/18/18 0508  WBC 4.2 6.0  HGB 14.0 14.1  HCT 41.4 41.9  PLT 159 160   Recent Labs    11/18/18 0508  NA 140  K 4.4  CL 107  CO2 23  GLUCOSE 105*  BUN 21  CREATININE 1.38*  CALCIUM 9.2    Intake/Output Summary (Last 24 hours) at 11/19/2018 1443 Last data filed at 11/19/2018 0900 Gross per 24 hour  Intake 280 ml  Output 200 ml  Net 80 ml     Physical Exam: Vital Signs Blood pressure 110/67, pulse (!) 55, temperature (!) 97.4 F (36.3 C), temperature source Oral, resp. rate 20, weight 83.9 kg, SpO2 96 %.  Constitutional: No distress . Vital signs reviewed and labs Lying in bed- eyes shut- forgot again to chew food/eat; NAD HEENT: EOMI, L eyelid appears to have a sty on top of lid, but smaller, less distinct today Neck: supple Cardiovascular: RRR without murmur. Respiratory:CTA B/L GI: BS +, non-tender, non-distended   Musculoskeletal:     Comments: No edema LE's  Neurological:  Awakens, follows basic commands when awake Moderate to severe dysarthria  Follows simple commands with cues Right facial weakness Right upper extremity: 5/5 proximal distal Right lower extremity: 4-4+/5 proximal distal Left upper extremity: 1+-2-/5 proximal distal Left lower extremity: 3-/5 proximal to distal  Skin: Skin is warm and dry. Dry Psychiatric: delayed, cooperative; very poor initiation  Assessment/Plan: 1. Functional deficits secondary to R MCA infarct with L hemiparesis which require 3+  hours per day of interdisciplinary therapy in a comprehensive inpatient rehab setting.  Physiatrist is providing close team supervision and 24 hour management of active medical problems listed below.  Physiatrist and rehab team continue to assess barriers to discharge/monitor patient progress toward functional and medical goals  Care Tool:  Bathing    Body parts bathed by patient: Chest, Left arm, Abdomen, Front perineal area, Right upper leg, Left upper leg, Right lower leg, Face   Body parts bathed by helper: Right lower leg, Left lower leg, Front perineal area, Buttocks, Right arm     Bathing assist Assist Level: Moderate Assistance - Patient 50 - 74%     Upper Body Dressing/Undressing Upper body dressing   What is the patient wearing?: Pull over shirt    Upper body assist Assist Level: Maximal Assistance - Patient 25 - 49%    Lower Body Dressing/Undressing Lower body dressing      What is the patient wearing?: Incontinence brief, Pants     Lower body assist Assist for lower body dressing: Total Assistance - Patient < 25%     Toileting Toileting    Toileting assist Assist for toileting: 2 Helpers     Transfers Chair/bed transfer  Transfers assist     Chair/bed transfer assist level: Moderate Assistance - Patient 50 - 74%     Locomotion Ambulation   Ambulation assist   Ambulation activity did not occur: Safety/medical concerns(unsafe due to inability to stand; letheray and low BP)  Assist level: Maximal  Assistance - Patient 25 - 49% Assistive device: Walker-rolling Max distance: 14   Walk 10 feet activity   Assist  Walk 10 feet activity did not occur: Safety/medical concerns  Assist level: Maximal Assistance - Patient 25 - 49% Assistive device: Walker-rolling   Walk 50 feet activity   Assist Walk 50 feet with 2 turns activity did not occur: Safety/medical concerns         Walk 150 feet activity   Assist Walk 150 feet activity did not  occur: Safety/medical concerns         Walk 10 feet on uneven surface  activity   Assist Walk 10 feet on uneven surfaces activity did not occur: Safety/medical concerns         Wheelchair     Assist   Type of Wheelchair: Manual    Wheelchair assist level: Minimal Assistance - Patient > 75% Max wheelchair distance: 53'    Wheelchair 50 feet with 2 turns activity    Assist        Assist Level: Minimal Assistance - Patient > 75%   Wheelchair 150 feet activity     Assist      Assist Level: Dependent - Patient 0%   Blood pressure 110/67, pulse (!) 55, temperature (!) 97.4 F (36.3 C), temperature source Oral, resp. rate 20, weight 83.9 kg, SpO2 96 %.  1.  Left-sided weakness with facial droop and dysphagia secondary to large right MCA infarction with hemorrhagic conversion, embolic secondary to known A. fib on Eliquis as well as history of left MCA infarction 2018 status post thrombectomy for left M2 occlusion.  Anticoagulation on hold due to hemorrhage  930- will try Provigil for attention- can't start Ritalin per QT prolongation nor Amantadine for same reason             -Continue CIR therapies including PT, OT, and SLP  2.  Antithrombotics: -DVT/anticoagulation: Subcutaneous heparin initiated 11/11/2018  9/25- changed to Lovenox             Monitor for bleeding             -antiplatelet therapy: N/A 3. Pain Management: Neurontin 300 mg 3 times daily,tramadol as needed  9/25- will add Depakote 125 mg BID for headache since had new stroke.- seems to be helping--observe for neurosedation however 4. Mood: Valium 5 mg twice daily as needed anxiety             -antipsychotic agents: N/A 5. Neuropsych: This patient is not fully capable of making decisions on his own behalf. 6. Skin/Wound Care: Routine skin checks 7. Fluids/Electrolytes/Nutrition: Routine I/Os              . 8.  Atrial fibrillation status post ICD.  Follow cardiology services.  Patient with  recent cardioversion.  Cardiac rate controlled 9.  Acute on chronic diastolic congestive heart failure.  Lasix 40 mg daily.  Monitor for any signs of fluid overload             Daily weights needed   Filed Weights   11/17/18 0358 11/18/18 0232 11/19/18 0500  Weight: 84 kg 84.1 kg 83.9 kg    10.  Nonischemic cardiomyopathy.  Ejection fraction 15 to 20%.             See #9 11.  Hypertension.  Coreg 25 mg twice daily, Aldactone 12.5 mg daily, Entresto 24-26 mg twice daily  9/25- will decrease Coreg to 12.5 mg BID since BP is running so low/100/58  this AM             9/27--bp improved  9/29- BP doing OK [id number] systolic 12.  Hyperlipidemia.  Lipitor 13.  Post stroke dysphagia.  Dysphasia #2 thin liquids.  Follow-up speech therapy 14.  AKI.  Creatinine 1.23-1.54                9/25- Cr 1.46- in baseline  9/28- will recheck in AM  9/29- Cr 1.38- which is baseline. 15.  History of tobacco/marijuana abuse as well as history of remote cocaine use.  Provide counseling 16.  Medical noncompliance.  Counseling 17.  Chronic hep C.  Follow-up outpatient 18. R trochanteric bursitis-  9/25- ordered lidocaine patch for R lateral hip  -ROM/Ice 19. Fever: Tmx 101  -check ua/ucx, cxr  -left eye doesn't appear infected but given temp, will treat for blepharitis for now pending urine and chest  -continue to monitor  9/28- rechecked CXR since so junky- is (-)   9/29- U/A (+) for nitrites- waiting for sensitivities, so can start ABX.; will start warm compressed for L eyelid sty which appeared in last 1-2 days.  9/30- sty appears less distinct/less swollen 20 . Poor initiation  9/29- cannot start Amantadine due to severely prolonged QT interval- will have to wait on med changes.  9/30 added Provigil 100 mg daily  LOS: 6 days A FACE TO FACE EVALUATION WAS PERFORMED  Deitra Craine 11/19/2018, 2:43 PM

## 2018-11-19 NOTE — Progress Notes (Signed)
Speech Language Pathology Daily Session Note  Patient Details  Name: Glenn [name] MRN: [id number] Date of Birth: [birth year]  Today's Date: 11/19/2018 SLP Individual Time: 0725-0810 SLP Individual Time Calculation (min): 45 min  Short Term Goals: Week 1: SLP Short Term Goal 1 (Week 1): Pt will consume current dysphagia 2 solid, thin liquid diet with min overt s/s aspiration, efficient mastication and oral clearance with Mod A verbal cues for use of swallow stategies. SLP Short Term Goal 2 (Week 1): Pt will demonstrate efficient mastication and oral clearance with upgraded dysphagia 3 trials prior to solid advancement. SLP Short Term Goal 3 (Week 1): Pt will demonstate basic problem solving during familiar functional tasks with Mod A verbal/visual cues. SLP Short Term Goal 4 (Week 1): Pt will sustain attention for ~5 minutes with Mod A cues. SLP Short Term Goal 5 (Week 1): Pt will recall daily information with Mod A for use of compensatory memory strategies. SLP Short Term Goal 6 (Week 1): Pt will increase speech intelligibility to ~60% at the phrase level and Mod A for use of clear speech strategies.  Skilled Therapeutic Interventions:  Pt was seen for skilled ST targeting dysphagia and cognition. SLP facilitated session with skilled observation of pt consuming current diet dysphagia 1 (puree) solids and thin liquids. Pt exhibited immediate cough X1 after initial sip of thin from straw. Pt is still with baseline congestion/throat clearing (although appears slightly improved since yesterday) that does not appear directly tied to PO intake. Upgraded trials of dysphagia 2 (minced) solids X5 also provided. Pt required initially Mod A increasing to Max A to masticate both Dys 1 and Dys 2 solids; suspect reason for increased cueing a result of pt's progressive fatigue/lack of endurance during PO intake. Although pt able to verbally recall his swallow strategies with Min-Mod A question cues, he  continues to required Mod-Max A in order to utilize them to achieve complete oral clearance. Visual aid with swallow strategies posted on wall in front of pt's bed to encourage use. SLP also facilitated session with Min A verbal cues for scanning left and locating items on left side of breakfast tray, as well as basic problem solving during breakfast consumption. Total A verbal and visual cues required for use of external memory aid pt to recall clear speech strategies and structures activities targeting cognition from last session. Pt eventually unable to participate further in therapy due to lethargy and missed 15 minutes of skilled ST as a result. Pt left in bed with alarm set and all needs within reach. Continue per current plan of care.       Pain Pain Assessment Pain Scale: Faces Faces Pain Scale: Hurts a little bit Pain Type: Acute pain Pain Location: Head Pain Orientation: Right Pain Descriptors / Indicators: Headache Pain Onset: On-going Pain Intervention(s): RN made aware Multiple Pain Sites: Yes 2nd Pain Site Pain Score: (faces = 2) Pain Type: Acute pain Pain Location: Eye Pain Orientation: Left Pain Intervention(s): MD notified (Comment)(MD aware - RN providing warm compresses)  Therapy/Group: Individual Therapy  Arbutus Leas 11/19/2018, 8:14 AM

## 2018-11-19 NOTE — Progress Notes (Signed)
Occupational Therapy Session Note  Patient Details  Name: Glenn [name] MRN: [id number] Date of Birth: 07-07-1950  Today's Date: 11/19/2018 OT Individual Time: 1111-1205 OT Individual Time Calculation (min): 54 min    Short Term Goals: Week 1:  OT Short Term Goal 1 (Week 1): Pt will don shirt with mod A OT Short Term Goal 2 (Week 1): Pt will don pants with max A sit to stand with one caregiver OT Short Term Goal 3 (Week 1): Pt will perform squat pivot transfer on/ off BSc/ toilet with max A +1 OT Short Term Goal 4 (Week 1): Pt will demonstrate sustained attention to ADL task with min cues.  Skilled Therapeutic Interventions/Progress Updates:    Pt resting in bed upon arrival.  Pt preoccupied with locating his phone and checking to see if it would turn on.  His phone was located and plugged in to charge (at 0%). Pt became occupied with trying to find his "fast charger." Pt redirected to putting on clothing. Pt required mod A for supine>sit EOB with max verbal cues for sequencing.  Pt maintained sitting balance while Ted Hose and socks donned. Pt required mod A for squat pivot transfer to w/c with max verbal cues for sequencing.  Pt required increased assistance for dressing tasks this morning. Pt slow to initiate tasks and becomes internally distracted requiring max verbal cues to redirect.  Pt remained in w/c with all needs within reach, belt alarm activated, and half lap tray in place.   Therapy Documentation Precautions:  Precautions Precautions: Fall, ICD/Pacemaker Precaution Comments: L hemi; L inattention Restrictions Weight Bearing Restrictions: No Pain:  Pt with no c/o pain   Therapy/Group: Individual Therapy  Leroy Libman 11/19/2018, 12:42 PM

## 2018-11-19 NOTE — Consult Note (Signed)
Neuropsychological Consultation   Patient:   Glenn [name]   DOB:   [birth year]  MR Number:  [id number]  Location:  [facility] [address] [facility] [address] [place] [id number] [place] [address] Dept: [place]: [phone]           Date of Service:   11/19/2018  Start Time:   10 AM End Time:   10:45 AM  Provider/Observer:  Ilean Skill, Psy.D.       Clinical Neuropsychologist       Billing Code/Service: [phone]  Chief Complaint:    Glenn [name] is a 68 year old male with history of atrial fib status post ICD as well as recent cardioversion 9/9, diastolic congestive heart failure, Hep C, hypertension and prior left MCA infarction 2018, tobacco abuse as well as remote history of cocaine and medical noncompliance.  Presented on 11/10/2018 with left hemiparesis and facial droop.  CT showed large right MCA subacute hemorrhage centered in the head of the caudate and putamen on the right.  CT angio showed subtotal occlusion or right M1 segment with irregularity most consistent with thrombus.  Patient now admitted for CIR program.    Reason for Service:  The patient was referred for neuropsychological consultation due to coping and adjustment issues following his recent hemorrhagic stroke.  Below is the HPI for the current admission.  HPI: Glenn [name] is a 68 year old right-handed male with history of atrial fibrillation maintained on Eliquis status post ICD as well as recent cardioversion [id number], diastolic congestive heart failure with ejection fraction of 15 to 20%, hepatitis C, hypertension  and prior left MCA infarction 2018 status post thrombectomy for left M2 occlusion, tobacco abuse as well as remote history of cocaine and medical noncompliance.  History taken from chart review patient due to dysarthria and lethargy.  Patient lives with a roomate.  1 level home 5 steps to enter.  Reportedly independent  working as a Dealer. He has a son in the area.  Presented 11/10/2018 with left hemiparesis and facial droop.  Noted BUN 1.54, COVID negative.  Cranial CT scan showed large right MCA subacute hemorrhage.  Area of hemorrhage measuring approximate 2 x 3 cm centered in the head of the caudate and putamen on the right.  Patient did not receive TPA.  CT angiogram of head and neck showed subtotal occlusion right M1 segment with irregularity most consistent with thrombus.  Patient's Eliquis was reversed with Kcentra.  Echocardiogram with ejection fraction 15-20% Neurology follow-up initially maintained on hypertonic saline later discontinued.  Follow-up cardiology services for nonischemic cardiomyopathy as well as acute on chronic congestive heart failure and currently maintained on Coreg, Lasix, Aldactone as well as Entresto.  Patient currently remains off anticoagulation however subcutaneous heparin was initiated for DVT prophylaxis 11/11/2018.  Patient on dysphagia to thin liquid diet.  Therapy evaluations completed and patient was admitted for a comprehensive rehab program  Current Status:  Today, the patient was very lethargic at times and would wax and wane as far as his arousal levels.  Initially he was difficult to started complete communication with him but at times he opened up and was able to communicate.  He reports that he was very tired from prior rehab efforts but he had only had 1 speech therapy appointment prior today.  The patient denied any significant depression or anxiety but was clearly having difficulty engaging and interacting.  It was difficult to assess mental status during the  visit today.  Behavioral Observation: Glenn [name]  presents as a 68 y.o.-year-old Right African American Male who appeared his stated age. his dress was Appropriate and he was Well Groomed and his manners were Appropriate to the situation.  his participation was indicative of Appropriate, Drowsy and Inattentive  behaviors.  There were any physical disabilities noted.  he displayed an appropriate level of cooperation and motivation.     Interactions:    Minimal Drowsy and Inattentive  Attention:   abnormal and attention span appeared shorter than expected for age  Memory:   abnormal; remote memory intact, recent memory impaired  Visuo-spatial:  not examined  Speech (Volume):  low  Speech:   normal; slowed response style  Thought Process:  Coherent and Relevant  Though Content:  WNL; not suicidal and not homicidal  Orientation:   person, place and time/date  Judgment:   Fair  Planning:   Poor  Affect:    Blunted and Flat  Mood:    Dysphoric  Insight:   Fair  Intelligence:   normal  Medical History:   Past Medical History:  Diagnosis Date  . Atrial fibrillation ([place])   . CHF (congestive heart failure) ([place])   . Hepatitis C   . Hypertension   . Stroke ([place])   . Visit for monitoring Tikosyn therapy 03/26/2017    Psychiatric History:  Patient denies any prior psychiatric history.  He does have a history of remote cocaine use/abuse.  Family Med/Psych History:  Family History  Problem Relation Age of Onset  . High blood pressure Mother   . High blood pressure Father   . Stroke Maternal Aunt   . Heart disease Neg Hx     Risk of Suicide/Violence: low patient denies any suicidal homicidal ideation.  Impression/DX:  Glenn [name] is a 68 year old male with history of atrial fib status post ICD as well as recent cardioversion 9/9, diastolic congestive heart failure, Hep C, hypertension and prior left MCA infarction 2018, tobacco abuse as well as remote history of cocaine and medical noncompliance.  Presented on 11/10/2018 with left hemiparesis and facial droop.  CT showed large right MCA subacute hemorrhage centered in the head of the caudate and putamen on the right.  CT angio showed subtotal occlusion or right M1 segment with irregularity most consistent with thrombus.   Patient now admitted for CIR program.   Today, the patient was very lethargic at times and would wax and wane as far as his arousal levels.  Initially he was difficult to started complete communication with him but at times he opened up and was able to communicate.  He reports that he was very tired from prior rehab efforts but he had only had 1 speech therapy appointment prior today.  The patient denied any significant depression or anxiety but was clearly having difficulty engaging and interacting.  It was difficult to assess mental status during the visit today.   Diagnosis:    Right middle cerebral artery stroke ([name]) - Plan: Ambulatory referral to Neurology  Cough with fever - Plan: DG Chest 2 View, DG Chest 2 View  Respiratory tract congestion with cough - Plan: DG Chest 2 View, DG Chest 2 View, CANCELED: DG Chest Portable 2 Views, CANCELED: DG Chest Portable 2 Views         Electronically Signed   _______________________ Ilean Skill, Psy.D.

## 2018-11-19 NOTE — Plan of Care (Signed)
  Problem: Consults Goal: RH STROKE PATIENT EDUCATION Description: See Patient Education module for education specifics  Outcome: Progressing Goal: Nutrition Consult-if indicated Outcome: Progressing Goal: Diabetes Guidelines if Diabetic/Glucose > 140 Description: If diabetic or lab glucose is > 140 mg/dl - Initiate Diabetes/Hyperglycemia Guidelines & Document Interventions  Outcome: Progressing   Problem: RH BOWEL ELIMINATION Goal: RH STG MANAGE BOWEL WITH ASSISTANCE Description: STG Manage Bowel with min Assistance. Outcome: Progressing Goal: RH STG MANAGE BOWEL W/MEDICATION W/ASSISTANCE Description: STG Manage Bowel with Medication with min Assistance. Outcome: Progressing   Problem: RH BLADDER ELIMINATION Goal: RH STG MANAGE BLADDER WITH ASSISTANCE Description: STG Manage Bladder With min Assistance Outcome: Progressing Goal: RH STG MANAGE BLADDER WITH EQUIPMENT WITH ASSISTANCE Description: STG Manage Bladder With Equipment With min Assistance Outcome: Progressing   Problem: RH SKIN INTEGRITY Goal: RH STG SKIN FREE OF INFECTION/BREAKDOWN Outcome: Progressing Goal: RH STG MAINTAIN SKIN INTEGRITY WITH ASSISTANCE Description: STG Maintain Skin Integrity With mod I Assistance. Outcome: Progressing   Problem: RH SAFETY Goal: RH STG ADHERE TO SAFETY PRECAUTIONS W/ASSISTANCE/DEVICE Description: STG Adhere to Safety Precautions With min Assistance/Device. Outcome: Progressing Goal: RH STG DECREASED RISK OF FALL WITH ASSISTANCE Description: STG Decreased Risk of Fall With min Assistance. Outcome: Progressing   Problem: RH COGNITION-NURSING Goal: RH STG USES MEMORY AIDS/STRATEGIES W/ASSIST TO PROBLEM SOLVE Description: STG Uses Memory Aids/Strategies With min Assistance to Problem Solve. Outcome: Progressing Goal: RH STG ANTICIPATES NEEDS/CALLS FOR ASSIST W/ASSIST/CUES Description: STG Anticipates Needs/Calls for Assist With min Assistance/Cues. Outcome: Progressing    Problem: RH PAIN MANAGEMENT Goal: RH STG PAIN MANAGED AT OR BELOW PT'S PAIN GOAL Description: Less than 3 out of 10 Outcome: Progressing   Problem: RH KNOWLEDGE DEFICIT Goal: RH STG INCREASE KNOWLEDGE OF HYPERTENSION Description: Patient able to state 2 methods in controlling hypertension Outcome: Progressing Goal: RH STG INCREASE KNOWLEDGE OF DYSPHAGIA/FLUID INTAKE Description: Patient able to demonstrate appropriate swallowing precautions Outcome: Progressing Goal: RH STG INCREASE KNOWLEGDE OF HYPERLIPIDEMIA Description: Patient able to identify medication for hyperlipidemia.  Outcome: Progressing Goal: RH STG INCREASE KNOWLEDGE OF STROKE PROPHYLAXIS Description: Patient able to state 2 medications he is on for stroke prevention Outcome: Progressing

## 2018-11-19 NOTE — Progress Notes (Signed)
Physical Therapy Session Note  Patient Details  Name: Glenn [name] MRN: [id number] Date of Birth: [birth year]  Today's Date: 11/19/2018 PT Individual Time: 1300-1400 PT Individual Time Calculation (min): 60 min   Short Term Goals: Week 1:  PT Short Term Goal 1 (Week 1): Pt will be able to perform bed mobility with mod assist PT Short Term Goal 2 (Week 1): Pt will be able to perform bed <> w/c transfers with mod assist PT Short Term Goal 3 (Week 1): Pt will be able to inititate gait training PT Short Term Goal 4 (Week 1): Pt will be able to propel w/c with hemi technique x 50' with min assist  Skilled Therapeutic Interventions/Progress Updates:     Patient in w/c upon PT arrival. Patient alert and agreeable to PT session. Patient reported 12/10 L posterolateral hip pain during session, RN made aware and provided pain medicine during session. PT provided repositioning, rest breaks, and distraction as pain interventions throughout session.   Therapeutic Activity: Bed Mobility: Patient performed supine to/from sit with mod A of 1 person to assist coming from R side-lying and mod A of 2 people coming from L side-lying. Assist for LE and trunk managment. Provided verbal cues for rolling to side-lying then setting elbow to push up to elbow then hand to sit up. Transfers: Patient performed sit to/from stand x1 using the RW with min A and squat pivot x3 with mod A x2 and mod-max A of 2 x1 due to lethargy at end of session. Provided verbal cues for leaning forward to stand, shifting trunk to the R, pushing through L UE and LE to stand, and hand placement.  Wheelchair Mobility:  Patient attempted to propelled wheelchair using R UE and LE hemi-technique and reported soreness in R hamstring and did not want to continue with activity. PT transported patient with total A to from the ortho gym during session for energy conservation and pain management.  Neuromuscular Re-ed: Patient performed  quadruped with mod A of 2 to get into position and min A to maintain position. Provided a mirror in front of him for visual feedback to push through L UE with manual facilitation for elbow extension and weight shift to the R x4 min.  He performed standing balance with the RW with a L hand splint in front of a mirror x3 min with increased L trunk lean, increased L knee flexion, L pelvic retraction, and decreased weigh shift to the L. PT provided facilitation to patient's hips and trunk to attain symmetry in standing, patient able to recreate symmetry x2 with min A and maintain for 4-5 seconds before returning to previous posture. Required min-mod A of 2 people to maintain balance in standing.   Therapeutic Exercise: Patient performed the following exercises with verbal and tactile cues for proper technique. Performed lateral hip stretch in L side lying for R hip pain 2x1 min  Patient became very lethargic during session, requiring cues in standing to open his eyes to maintain arousal. Once in sitting he required mod-max cues to keep his eyes open and once lying in bed at end of session patient was asleep and non responsive to cues and breathing deeply.   Patient in bed at end of session with breaks locked, bed alarm set, and all needs within reach. Vitals: BP 125/99 HR 45, RN made aware and stated she will re-assess vitals manually.   Therapy Documentation Precautions:  Precautions Precautions: Fall, ICD/Pacemaker Precaution Comments: L hemi; L inattention Restrictions Weight  Bearing Restrictions: No General: PT Amount of Missed Time (min): 15 Minutes PT Missed Treatment Reason: Patient fatigue    Therapy/Group: Individual Therapy  Kherington Meraz L Annamaria Salah PT, DPT  11/19/2018, 3:25 PM

## 2018-11-20 ENCOUNTER — Inpatient Hospital Stay (HOSPITAL_COMMUNITY): Payer: Medicare HMO

## 2018-11-20 ENCOUNTER — Inpatient Hospital Stay (HOSPITAL_COMMUNITY): Payer: Medicare HMO | Admitting: Speech Pathology

## 2018-11-20 ENCOUNTER — Other Ambulatory Visit: Payer: Self-pay

## 2018-11-20 DIAGNOSIS — N179 Acute kidney failure, unspecified: Secondary | ICD-10-CM

## 2018-11-20 DIAGNOSIS — N1831 Chronic kidney disease, stage 3a: Secondary | ICD-10-CM

## 2018-11-20 LAB — CBC WITH DIFFERENTIAL/PLATELET
Abs Immature Granulocytes: 0.02 10*3/uL (ref 0.00–0.07)
Basophils Absolute: 0 10*3/uL (ref 0.0–0.1)
Basophils Relative: 0 %
Eosinophils Absolute: 0.4 10*3/uL (ref 0.0–0.5)
Eosinophils Relative: 7 %
HCT: 38.8 % — ABNORMAL LOW (ref 39.0–52.0)
Hemoglobin: 12.6 g/dL — ABNORMAL LOW (ref 13.0–17.0)
Immature Granulocytes: 0 %
Lymphocytes Relative: 36 %
Lymphs Abs: 2.1 10*3/uL (ref 0.7–4.0)
MCH: 31.6 pg (ref 26.0–34.0)
MCHC: 32.5 g/dL (ref 30.0–36.0)
MCV: 97.2 fL (ref 80.0–100.0)
Monocytes Absolute: 0.6 10*3/uL (ref 0.1–1.0)
Monocytes Relative: 11 %
Neutro Abs: 2.6 10*3/uL (ref 1.7–7.7)
Neutrophils Relative %: 46 %
Platelets: 202 10*3/uL (ref 150–400)
RBC: 3.99 MIL/uL — ABNORMAL LOW (ref 4.22–5.81)
RDW: 12.3 % (ref 11.5–15.5)
WBC: 5.7 10*3/uL (ref 4.0–10.5)
nRBC: 0 % (ref 0.0–0.2)

## 2018-11-20 LAB — COMPREHENSIVE METABOLIC PANEL
ALT: 22 U/L (ref 0–44)
AST: 37 U/L (ref 15–41)
Albumin: 3.2 g/dL — ABNORMAL LOW (ref 3.5–5.0)
Alkaline Phosphatase: 71 U/L (ref 38–126)
Anion gap: 10 (ref 5–15)
BUN: 34 mg/dL — ABNORMAL HIGH (ref 8–23)
CO2: 25 mmol/L (ref 22–32)
Calcium: 9 mg/dL (ref 8.9–10.3)
Chloride: 107 mmol/L (ref 98–111)
Creatinine, Ser: 1.79 mg/dL — ABNORMAL HIGH (ref 0.61–1.24)
GFR calc Af Amer: 45 mL/min — ABNORMAL LOW (ref >60)
GFR calc non Af Amer: 39 mL/min — ABNORMAL LOW (ref >60)
Glucose, Bld: 112 mg/dL — ABNORMAL HIGH (ref 70–99)
Potassium: 4.8 mmol/L (ref 3.5–5.1)
Sodium: 142 mmol/L (ref 135–145)
Total Bilirubin: 0.8 mg/dL (ref 0.3–1.2)
Total Protein: 6.5 g/dL (ref 6.5–8.1)

## 2018-11-20 MED ORDER — MODAFINIL 100 MG PO TABS
100.0000 mg | ORAL_TABLET | Freq: Every day | ORAL | Status: DC
  Administered 2018-11-21 – 2018-12-02 (×12): 100 mg via ORAL
  Filled 2018-11-20 (×12): qty 1

## 2018-11-20 MED ORDER — ENOXAPARIN SODIUM 40 MG/0.4ML ~~LOC~~ SOLN
40.0000 mg | SUBCUTANEOUS | Status: DC
  Administered 2018-11-21 – 2018-12-02 (×12): 40 mg via SUBCUTANEOUS
  Filled 2018-11-20 (×12): qty 0.4

## 2018-11-20 MED ORDER — SODIUM CHLORIDE 0.9 % IV SOLN
INTRAVENOUS | Status: AC
  Administered 2018-11-20 – 2018-11-21 (×2): via INTRAVENOUS

## 2018-11-20 MED ORDER — SODIUM CHLORIDE 0.9 % NICU IV INFUSION SIMPLE: INJECTION | INTRAVENOUS | Status: DC

## 2018-11-20 NOTE — Progress Notes (Signed)
Speech Language Pathology Daily Session Note  Patient Details  Name: Glenn [name] MRN: [id number] Date of Birth: 05-31-1950  Today's Date: 11/20/2018 SLP Individual Time: 1000-1059 SLP Individual Time Calculation (min): 59 min  Short Term Goals: Week 1: SLP Short Term Goal 1 (Week 1): Pt will consume current dysphagia 2 solid, thin liquid diet with min overt s/s aspiration, efficient mastication and oral clearance with Mod A verbal cues for use of swallow stategies. SLP Short Term Goal 2 (Week 1): Pt will demonstrate efficient mastication and oral clearance with upgraded dysphagia 3 trials prior to solid advancement. SLP Short Term Goal 3 (Week 1): Pt will demonstate basic problem solving during familiar functional tasks with Mod A verbal/visual cues. SLP Short Term Goal 4 (Week 1): Pt will sustain attention for ~5 minutes with Mod A cues. SLP Short Term Goal 5 (Week 1): Pt will recall daily information with Mod A for use of compensatory memory strategies. SLP Short Term Goal 6 (Week 1): Pt will increase speech intelligibility to ~60% at the phrase level and Mod A for use of clear speech strategies.  Skilled Therapeutic Interventions: Pt was seen for skilled ST targeting dysphagia and cognitive goals. SLP facilitated session with upgraded trials of Dys 2 (minced) solids and thin liquids. Pt is still with intermittent throat clearing, mild cough, which was evident during trials but did not appear directly tied to PO intake (pt has a cold). Pt verbally recalled 3 swallow strategies with Min-Mod A questions cues (slow rate, small bites/sips, use tongue to clear left side of mouth). Total A provided to direct pt to external aid with strategies posted in room in order to recall other strategies - use of liquid wash and direct food to right (strong) side of mouth. Pt demonstrated overall improved efficiency of mastication and oral clearance, during Dys 2 trials, however still requires consistent  Mod-Max A for sustained attention and awareness of food in his mouth during intake, as well as Mod A verbal prompt to prevent pocketing. Therefore, pt should continue current Dys 1, thin liquid diet, pills crushed, with FULL supervision to ensure alertness and use of swallow strategies. Pt also facilitated session with Max A verbal cues for sustained attention, Mod A for error awareness and correction during a basic money management task (AFLA). Pt continues to be extremely internally and externally distracted and occasionally makes irrelevant comments, but is redirectable. RN arrived during session to administer medications/patch for pt's leg soreness. SLP assisted with sequencing standing to standing from chair to stedy with Min A verbal cues. Pt was left in wheelchair with seatbelt alarm activated and call bell within reach. Continue per current plan of care.      Pain Pain Assessment Pain Scale: Faces Faces Pain Scale: Hurts little more Pain Type: Acute pain Pain Location: Leg Pain Orientation: Right;Left Pain Descriptors / Indicators: Sore(suspect due to exercise from therapies) Pain Onset: On-going Pain Intervention(s): Medication (See eMAR);RN made aware Multiple Pain Sites: No  Therapy/Group: Individual Therapy  Arbutus Leas 11/20/2018, 12:01 PM

## 2018-11-20 NOTE — Progress Notes (Signed)
[facility] PHYSICAL MEDICINE & REHABILITATION PROGRESS NOTE   Subjective/Complaints:   Pt reports L eyelid still slightly painful. But reports it's a lot better. NT helping pt eat- requires constant cuing to eat/chew and some of food just kept falling out mouth and pt didn't notice.  Does appear more awake this AM   ROS: Limited due to cognitive/behavioral    Objective:   No results found. Recent Labs    11/18/18 0508 11/20/18 0505  WBC 6.0 5.7  HGB 14.1 12.6*  HCT 41.9 38.8*  PLT 160 202   Recent Labs    11/18/18 0508 11/20/18 0505  NA 140 142  K 4.4 4.8  CL 107 107  CO2 23 25  GLUCOSE 105* 112*  BUN 21 34*  CREATININE 1.38* 1.79*  CALCIUM 9.2 9.0    Intake/Output Summary (Last 24 hours) at 11/20/2018 0930 Last data filed at 11/20/2018 0841 Gross per 24 hour  Intake 540 ml  Output -  Net 540 ml     Physical Exam: Vital Signs Blood pressure (!) 92/57, pulse 84, temperature 98.6 F (37 C), resp. rate 18, height 5\' 7"  (1.702 m), weight 85.6 kg, SpO2 96 %.  Constitutional: No distress . Vital signs reviewed and labs reviewed Sitting up; more awake; got Provigil; food spilling out of mouth while eating;  HEENT: EOMI, L eyelid appears to have a sty on top of lid, but almost gone Neck: supple Cardiovascular: RRR without murmur. Respiratory:CTA B/L GI: BS +, non-tender, non-distended   Musculoskeletal:     Comments: No edema LE's  Neurological:  Awakens, follows basic commands when awake Moderate to severe dysarthria  Follows simple commands with cues Right facial weakness Right upper extremity: 5/5 proximal distal Right lower extremity: 4-4+/5 proximal distal Left upper extremity: 1+-2-/5 proximal distal Left lower extremity: 3-/5 proximal to distal  Skin: Skin is warm and dry. Dry Psychiatric: delayed, cooperative; very poor initiation  Assessment/Plan: 1. Functional deficits secondary to R MCA infarct with L hemiparesis which require 3+ hours per  day of interdisciplinary therapy in a comprehensive inpatient rehab setting.  Physiatrist is providing close team supervision and 24 hour management of active medical problems listed below.  Physiatrist and rehab team continue to assess barriers to discharge/monitor patient progress toward functional and medical goals  Care Tool:  Bathing    Body parts bathed by patient: Face, Abdomen, Chest   Body parts bathed by helper: Right arm, Left arm, Front perineal area, Buttocks, Right upper leg, Left upper leg, Right lower leg, Left lower leg     Bathing assist Assist Level: Total Assistance - Patient < 25%     Upper Body Dressing/Undressing Upper body dressing   What is the patient wearing?: Pull over shirt    Upper body assist Assist Level: Maximal Assistance - Patient 25 - 49%    Lower Body Dressing/Undressing Lower body dressing      What is the patient wearing?: Underwear/pull up, Pants     Lower body assist Assist for lower body dressing: Total Assistance - Patient < 25%     Toileting Toileting    Toileting assist Assist for toileting: Dependent - Patient 0%     Transfers Chair/bed transfer  Transfers assist     Chair/bed transfer assist level: 2 Helpers     Locomotion Ambulation   Ambulation assist   Ambulation activity did not occur: Safety/medical concerns(unsafe due to inability to stand; letheray and low BP)  Assist level: Maximal Assistance - Patient 25 -  49% Assistive device: Walker-rolling Max distance: 14   Walk 10 feet activity   Assist  Walk 10 feet activity did not occur: Safety/medical concerns  Assist level: Maximal Assistance - Patient 25 - 49% Assistive device: Walker-rolling   Walk 50 feet activity   Assist Walk 50 feet with 2 turns activity did not occur: Safety/medical concerns         Walk 150 feet activity   Assist Walk 150 feet activity did not occur: Safety/medical concerns         Walk 10 feet on uneven  surface  activity   Assist Walk 10 feet on uneven surfaces activity did not occur: Safety/medical concerns         Wheelchair     Assist   Type of Wheelchair: Manual    Wheelchair assist level: Minimal Assistance - Patient > 75% Max wheelchair distance: 31'    Wheelchair 50 feet with 2 turns activity    Assist        Assist Level: Minimal Assistance - Patient > 75%   Wheelchair 150 feet activity     Assist      Assist Level: Dependent - Patient 0%   Blood pressure (!) 92/57, pulse 84, temperature 98.6 F (37 C), resp. rate 18, height 5\' 7"  (1.702 m), weight 85.6 kg, SpO2 96 %.  1.  Left-sided weakness with facial droop and dysphagia secondary to large right MCA infarction with hemorrhagic conversion, embolic secondary to known A. fib on Eliquis as well as history of left MCA infarction 2018 status post thrombectomy for left M2 occlusion.  Anticoagulation on hold due to hemorrhage  930- will try Provigil for attention- can't start Ritalin per QT prolongation nor Amantadine for same reason             -Continue CIR therapies including PT, OT, and SLP  2.  Antithrombotics: -DVT/anticoagulation: Subcutaneous heparin initiated 11/11/2018  9/25- changed to Lovenox             Monitor for bleeding             -antiplatelet therapy: N/A 3. Pain Management: Neurontin 300 mg 3 times daily,tramadol as needed  9/25- will add Depakote 125 mg BID for headache since had new stroke.- seems to be helping--observe for neurosedation however 4. Mood: Valium 5 mg twice daily as needed anxiety             -antipsychotic agents: N/A 5. Neuropsych: This patient is not fully capable of making decisions on his own behalf. 6. Skin/Wound Care: Routine skin checks 7. Fluids/Electrolytes/Nutrition: Routine I/Os              . 8.  Atrial fibrillation status post ICD.  Follow cardiology services.  Patient with recent cardioversion.  Cardiac rate controlled 9.  Acute on chronic  diastolic congestive heart failure.  Lasix 40 mg daily.  Monitor for any signs of fluid overload             Daily weights needed   Filed Weights   11/18/18 0232 11/19/18 0500 11/20/18 0546  Weight: 84.1 kg 83.9 kg 85.6 kg    10.  Nonischemic cardiomyopathy.  Ejection fraction 15 to 20%.             See #9 11.  Hypertension.  Coreg 25 mg twice daily, Aldactone 12.5 mg daily, Entresto 24-26 mg twice daily  9/25- will decrease Coreg to 12.5 mg BID since BP is running so low/100/58 this AM  9/27--bp improved  9/29- BP doing OK [id number] systolic 12.  Hyperlipidemia.  Lipitor 13.  Post stroke dysphagia.  Dysphasia #2 thin liquids.  Follow-up speech therapy 14.  AKI.  Creatinine 1.23-1.54                9/25- Cr 1.46- in baseline  9/28- will recheck in AM  9/29- Cr 1.38- which is baseline.  10/1- Cr up significantly to 1.79 and Bun up to 34- will give IVFs NS 60cc/hr x 24 hrs and recheck labs in AM 15.  History of tobacco/marijuana abuse as well as history of remote cocaine use.  Provide counseling 16.  Medical noncompliance.  Counseling 17.  Chronic hep C.  Follow-up outpatient 18. R trochanteric bursitis-  9/25- ordered lidocaine patch for R lateral hip  -ROM/Ice 19. Fever: Tmx 101  -check ua/ucx, cxr  -left eye doesn't appear infected but given temp, will treat for blepharitis for now pending urine and chest  -continue to monitor  9/28- rechecked CXR since so junky- is (-)   9/29- U/A (+) for nitrites- waiting for sensitivities, so can start ABX.; will start warm compressed for L eyelid sty which appeared in last 1-2 days.  9/30- sty appears less distinct/less swollen 20 . Poor initiation  9/29- cannot start Amantadine due to severely prolonged QT interval- will have to wait on med changes.  9/30 added Provigil 100 mg daily  10/1- changed to 6am so more awake for day- did help this AM  LOS: 7 days A FACE TO FACE EVALUATION WAS PERFORMED  Lando Alcalde 11/20/2018,  9:30 AM

## 2018-11-20 NOTE — Plan of Care (Signed)
  Problem: Consults Goal: RH STROKE PATIENT EDUCATION Description: See Patient Education module for education specifics  Outcome: Progressing Goal: Nutrition Consult-if indicated Outcome: Progressing Goal: Diabetes Guidelines if Diabetic/Glucose > 140 Description: If diabetic or lab glucose is > 140 mg/dl - Initiate Diabetes/Hyperglycemia Guidelines & Document Interventions  Outcome: Progressing   Problem: RH BOWEL ELIMINATION Goal: RH STG MANAGE BOWEL WITH ASSISTANCE Description: STG Manage Bowel with min Assistance. Outcome: Progressing Goal: RH STG MANAGE BOWEL W/MEDICATION W/ASSISTANCE Description: STG Manage Bowel with Medication with min Assistance. Outcome: Progressing   Problem: RH BLADDER ELIMINATION Goal: RH STG MANAGE BLADDER WITH ASSISTANCE Description: STG Manage Bladder With min Assistance Outcome: Progressing Goal: RH STG MANAGE BLADDER WITH EQUIPMENT WITH ASSISTANCE Description: STG Manage Bladder With Equipment With min Assistance Outcome: Progressing   Problem: RH SKIN INTEGRITY Goal: RH STG SKIN FREE OF INFECTION/BREAKDOWN Outcome: Progressing Goal: RH STG MAINTAIN SKIN INTEGRITY WITH ASSISTANCE Description: STG Maintain Skin Integrity With mod I Assistance. Outcome: Progressing   Problem: RH SAFETY Goal: RH STG ADHERE TO SAFETY PRECAUTIONS W/ASSISTANCE/DEVICE Description: STG Adhere to Safety Precautions With min Assistance/Device. Outcome: Progressing Goal: RH STG DECREASED RISK OF FALL WITH ASSISTANCE Description: STG Decreased Risk of Fall With min Assistance. Outcome: Progressing   Problem: RH COGNITION-NURSING Goal: RH STG USES MEMORY AIDS/STRATEGIES W/ASSIST TO PROBLEM SOLVE Description: STG Uses Memory Aids/Strategies With min Assistance to Problem Solve. Outcome: Progressing   Problem: RH KNOWLEDGE DEFICIT Goal: RH STG INCREASE KNOWLEDGE OF HYPERTENSION Description: Patient able to state 2 methods in controlling hypertension Outcome:  Progressing Goal: RH STG INCREASE KNOWLEDGE OF DYSPHAGIA/FLUID INTAKE Description: Patient able to demonstrate appropriate swallowing precautions Outcome: Progressing Goal: RH STG INCREASE KNOWLEGDE OF HYPERLIPIDEMIA Description: Patient able to identify medication for hyperlipidemia.  Outcome: Progressing Goal: RH STG INCREASE KNOWLEDGE OF STROKE PROPHYLAXIS Description: Patient able to state 2 medications he is on for stroke prevention Outcome: Progressing

## 2018-11-20 NOTE — Progress Notes (Signed)
Occupational Therapy Session Note  Patient Details  Name: Glenn [name] MRN: [id number] Date of Birth: Jun 21, 1950  Today's Date: 11/20/2018 OT Individual Time: 0700-0758 OT Individual Time Calculation (min): 58 min    Short Term Goals: Week 2:  OT Short Term Goal 1 (Week 2): Pt will don shirt with mod A OT Short Term Goal 2 (Week 2): Pt will don pants with max A sit to stand with one caregiver OT Short Term Goal 3 (Week 2): Pt will demonstrate sustained attention to ADL task with min cues. OT Short Term Goal 4 (Week 2): Pt will complete toileting tasks with max A  Skilled Therapeutic Interventions/Progress Updates:    Pt resting in bed upon arrival and greeted pt appropriately.  Pt drinking water from cup upon arrival.  Pt requires full supervision for eating and drinking. OT intervention with focus on bed mobility, sitting balance, sit<>stand, standing balance, functional tranfsers, toileting, BADLs, attention to task, task initiation, sequencing, attention to L, standing balance, and safety awareness to increase independence with BADLs.  Supine>sit EOB-max A with max verbal cues Squat pivot transfer to w/c-max A [place] transfer-Stedy Bathing-max A UB dressing-max A LB dressing-Stedy for sit<>stand toileting-tot A   Pt incontinent of bowel and bladder. Pt required max verbal cues throughout session to initiate tasks, sequencing, attend to tasks, and attend to L. Pt easily internally/externally distracted and lacks emergent awareness when experiencing difficulty completing tasks.  Pt becomes fixated on random tasks and requires max multimodal cues to redirect.   Pt remained in w/c with belt alarm activated and half lap tray in place.  All needs within reach.   Therapy Documentation Precautions:  Precautions Precautions: Fall, ICD/Pacemaker Precaution Comments: L hemi; L inattention Restrictions Weight Bearing Restrictions: No Pain:  Pt c/o R hip pain when sitting on  Stedy   Therapy/Group: Individual Therapy  Leroy Libman 11/20/2018, 8:03 AM

## 2018-11-20 NOTE — Progress Notes (Signed)
Speech Language Pathology Daily Session Note  Patient Details  Name: Glenn [name] MRN: [id number] Date of Birth: 08/27/1950  Today's Date: 11/20/2018 SLP Individual Time: [id number] SLP Individual Time Calculation (min): 29 min  Short Term Goals: Week 1: SLP Short Term Goal 1 (Week 1): Pt will consume current dysphagia 2 solid, thin liquid diet with min overt s/s aspiration, efficient mastication and oral clearance with Mod A verbal cues for use of swallow stategies. SLP Short Term Goal 2 (Week 1): Pt will demonstrate efficient mastication and oral clearance with upgraded dysphagia 3 trials prior to solid advancement. SLP Short Term Goal 3 (Week 1): Pt will demonstate basic problem solving during familiar functional tasks with Mod A verbal/visual cues. SLP Short Term Goal 4 (Week 1): Pt will sustain attention for ~5 minutes with Mod A cues. SLP Short Term Goal 5 (Week 1): Pt will recall daily information with Mod A for use of compensatory memory strategies. SLP Short Term Goal 6 (Week 1): Pt will increase speech intelligibility to ~60% at the phrase level and Mod A for use of clear speech strategies.  Skilled Therapeutic Interventions: Pt was seen for skilled ST targeting communication goals. Pt was somewhat limited in his participation this afternoon, as he was internally distracted by leg pain/soreness (RN pre-medicated). Provided Min A question cues, pt verbally recalled 2 clear speech strategies, however required Total A to locate external visual aid in room to recall remaining 2 strategies. Pt required Mod A to utilize increased vocal intensity and overarticulation to achieve ~65% at the phrase level during a picture description task. Of note, increased vocal intensity greatly improved speech intelligibility and was also easiest for pt to consistently implement in comparison to other strategies. SLP assisted RN and NT in transferring pt from wheelchair to bed (Max A via stedy). Pt left in  bed with nursing still present. Continue per current plan of care.      Pain Pain Assessment Pain Scale: Faces Faces Pain Scale: Hurts a little bit Pain Type: Acute pain Pain Location: Leg Pain Orientation: Left;Right Pain Descriptors / Indicators: Sore Pain Onset: On-going Pain Intervention(s): Emotional support(Pt had already recieved medication) Multiple Pain Sites: No  Therapy/Group: Individual Therapy  Arbutus Leas 11/20/2018, 3:04 PM

## 2018-11-20 NOTE — Patient Outreach (Signed)
[facility]) Care Management  11/20/2018  Dmani Eckerd Dellis [birth year] [id number]   Referral Received from inpatient stay.  Patient currently in Inpatient rehab.  Discharge status not known at this time. RN CM will close case at this time.      Jone Baseman, RN, MSN [place] [facility] Care Management Coordinator Direct Line [phone] Cell [phone] Toll Free: [phone]  Fax: [phone]

## 2018-11-20 NOTE — Progress Notes (Signed)
Occupational Therapy Weekly Progress Note  Patient Details  Name: Glenn [name] MRN: [id number] Date of Birth: 04-17-1950  Beginning of progress report period: November 14, 2018 End of progress report period: November 20, 2018   Patient has met 1 of 4 short term goals.  Pt progress has been minimal this past week.  Pt continues to be lethargic with difficulty maintaining eyes open.  Pt requires max verbal cues to initiate all tasks and for sequencing bathing/dressing tasks.  Pt requires max verbal cues to attend to task and is easily distracted internally and externally.  Pt fluctuates in assist level required for functional transfers, sit<>stand, and standing balance-min A to max A.  Pt requires max verbal cues to attend to his L but does initiate use of LUE in functional tasks.   Patient continues to demonstrate the following deficits: muscle weakness, decreased cardiorespiratoy endurance, impaired timing and sequencing, abnormal tone, unbalanced muscle activation, motor apraxia, decreased coordination and decreased motor planning, decreased visual perceptual skills, decreased midline orientation, decreased attention to left and decreased motor planning, decreased initiation, decreased attention, decreased awareness, decreased problem solving, decreased safety awareness and delayed processing and decreased sitting balance, decreased standing balance, decreased postural control, hemiplegia, decreased balance strategies and difficulty maintaining precautions and therefore will continue to benefit from skilled OT intervention to enhance overall performance with BADL and Reduce care partner burden.  Patient progressing toward long term goals..  Continue plan of care.  OT Short Term Goals Week 1:  OT Short Term Goal 1 (Week 1): Pt will don shirt with mod A OT Short Term Goal 1 - Progress (Week 1): Progressing toward goal OT Short Term Goal 2 (Week 1): Pt will don pants with max A sit to stand with  one caregiver OT Short Term Goal 2 - Progress (Week 1): Progressing toward goal OT Short Term Goal 3 (Week 1): Pt will perform squat pivot transfer on/ off BSc/ toilet with max A +1 OT Short Term Goal 3 - Progress (Week 1): Met OT Short Term Goal 4 (Week 1): Pt will demonstrate sustained attention to ADL task with min cues. OT Short Term Goal 4 - Progress (Week 1): Progressing toward goal Week 2:  OT Short Term Goal 1 (Week 2): Pt will don shirt with mod A OT Short Term Goal 2 (Week 2): Pt will don pants with max A sit to stand with one caregiver OT Short Term Goal 3 (Week 2): Pt will demonstrate sustained attention to ADL task with min cues. OT Short Term Goal 4 (Week 2): Pt will complete toileting tasks with max A   Leroy Libman 11/20/2018, 6:56 AM

## 2018-11-20 NOTE — Progress Notes (Signed)
Physical Therapy Session Note  Patient Details  Name: Glenn [name] MRN: [id number] Date of Birth: Apr 11, 1950  Today's Date: 11/20/2018 PT Individual Time: [id number] PT Individual Time Calculation (min): 45 min   Short Term Goals: Week 1:  PT Short Term Goal 1 (Week 1): Pt will be able to perform bed mobility with mod assist PT Short Term Goal 2 (Week 1): Pt will be able to perform bed <> w/c transfers with mod assist PT Short Term Goal 3 (Week 1): Pt will be able to inititate gait training PT Short Term Goal 4 (Week 1): Pt will be able to propel w/c with hemi technique x 50' with min assist Week 2:     Skilled Therapeutic Interventions/Progress Updates:  Pt seated in w/c, finishing up lunch with NT feeding him.   With PT's assistance, pt held spoon, scooped up food and brought it to his mouth, successfully, approx 6 x. VCs for clearing L cheek. He drank tea; extra time to swallow was noted.     He was very lethargic, but attempted to answer questions.  Speech was slurred and very difficult to understand.    Squat pivot transfer to R w/c> mat with max assist.    neuromuscular re-education via multimodal cues for midline orientation in sitting.  Therapeutic activity for facilitating L visual attention with multimodal cues,  wt bearing LUE while manipulating and naming playing cards off of a board to L, in front of him.  Pt needed max cues to sustain attention to task, and scan L.  He was internally and externally distracted.  Squat pivot to L to return to w/c, max assist.  At end of session, pt resting in w/c with seat belt alarm set and needs at hand.     Therapy Documentation Precautions:  Precautions Precautions: Fall, ICD/Pacemaker Precaution Comments: L hemi; L inattention Restrictions Weight Bearing Restrictions: No Pain:  pt stated that R flank/ribs were sore.  Unrated.          Therapy/Group: Individual Therapy  Jobe Mutch 11/20/2018, 4:22 PM

## 2018-11-21 ENCOUNTER — Inpatient Hospital Stay (HOSPITAL_COMMUNITY): Payer: Medicare HMO | Admitting: Speech Pathology

## 2018-11-21 ENCOUNTER — Inpatient Hospital Stay (HOSPITAL_COMMUNITY): Payer: Medicare HMO

## 2018-11-21 LAB — BASIC METABOLIC PANEL
Anion gap: 12 (ref 5–15)
BUN: 33 mg/dL — ABNORMAL HIGH (ref 8–23)
CO2: 20 mmol/L — ABNORMAL LOW (ref 22–32)
Calcium: 8.8 mg/dL — ABNORMAL LOW (ref 8.9–10.3)
Chloride: 109 mmol/L (ref 98–111)
Creatinine, Ser: 1.43 mg/dL — ABNORMAL HIGH (ref 0.61–1.24)
GFR calc Af Amer: 59 mL/min — ABNORMAL LOW (ref >60)
GFR calc non Af Amer: 51 mL/min — ABNORMAL LOW (ref >60)
Glucose, Bld: 115 mg/dL — ABNORMAL HIGH (ref 70–99)
Potassium: 4.9 mmol/L (ref 3.5–5.1)
Sodium: 141 mmol/L (ref 135–145)

## 2018-11-21 MED ORDER — LIDOCAINE 4 % EX CREA
TOPICAL_CREAM | Freq: Three times a day (TID) | CUTANEOUS | Status: DC
  Administered 2018-11-21 (×2): via TOPICAL
  Administered 2018-11-21: 1 via TOPICAL
  Administered 2018-11-22 (×2): via TOPICAL
  Administered 2018-11-22 – 2018-11-23 (×2): 1 via TOPICAL
  Administered 2018-11-23: 08:00:00 via TOPICAL
  Administered 2018-11-24: 1 via TOPICAL
  Administered 2018-11-24 (×2): via TOPICAL
  Administered 2018-11-25: 1 via TOPICAL
  Administered 2018-11-25 (×2): via TOPICAL
  Administered 2018-11-26 – 2018-11-28 (×6): 1 via TOPICAL
  Administered 2018-11-28 (×2): via TOPICAL
  Administered 2018-11-28 – 2018-12-02 (×9): 1 via TOPICAL
  Filled 2018-11-21 (×3): qty 5

## 2018-11-21 NOTE — Plan of Care (Signed)
  Problem: Consults Goal: RH STROKE PATIENT EDUCATION Description: See Patient Education module for education specifics  Outcome: Progressing Goal: Nutrition Consult-if indicated Outcome: Progressing Goal: Diabetes Guidelines if Diabetic/Glucose > 140 Description: If diabetic or lab glucose is > 140 mg/dl - Initiate Diabetes/Hyperglycemia Guidelines & Document Interventions  Outcome: Progressing   Problem: RH BOWEL ELIMINATION Goal: RH STG MANAGE BOWEL WITH ASSISTANCE Description: STG Manage Bowel with min Assistance. Outcome: Progressing Goal: RH STG MANAGE BOWEL W/MEDICATION W/ASSISTANCE Description: STG Manage Bowel with Medication with min Assistance. Outcome: Progressing   Problem: RH BLADDER ELIMINATION Goal: RH STG MANAGE BLADDER WITH ASSISTANCE Description: STG Manage Bladder With min Assistance Outcome: Progressing Goal: RH STG MANAGE BLADDER WITH EQUIPMENT WITH ASSISTANCE Description: STG Manage Bladder With Equipment With min Assistance Outcome: Progressing   Problem: RH SKIN INTEGRITY Goal: RH STG SKIN FREE OF INFECTION/BREAKDOWN Outcome: Progressing Goal: RH STG MAINTAIN SKIN INTEGRITY WITH ASSISTANCE Description: STG Maintain Skin Integrity With mod I Assistance. Outcome: Progressing   Problem: RH SAFETY Goal: RH STG ADHERE TO SAFETY PRECAUTIONS W/ASSISTANCE/DEVICE Description: STG Adhere to Safety Precautions With min Assistance/Device. Outcome: Progressing Goal: RH STG DECREASED RISK OF FALL WITH ASSISTANCE Description: STG Decreased Risk of Fall With min Assistance. Outcome: Progressing   Problem: RH COGNITION-NURSING Goal: RH STG USES MEMORY AIDS/STRATEGIES W/ASSIST TO PROBLEM SOLVE Description: STG Uses Memory Aids/Strategies With min Assistance to Problem Solve. Outcome: Progressing Goal: RH STG ANTICIPATES NEEDS/CALLS FOR ASSIST W/ASSIST/CUES Description: STG Anticipates Needs/Calls for Assist With min Assistance/Cues. Outcome: Progressing    Problem: RH PAIN MANAGEMENT Goal: RH STG PAIN MANAGED AT OR BELOW PT'S PAIN GOAL Description: Less than 3 out of 10 Outcome: Progressing   Problem: RH KNOWLEDGE DEFICIT Goal: RH STG INCREASE KNOWLEDGE OF HYPERTENSION Description: Patient able to state 2 methods in controlling hypertension Outcome: Progressing Goal: RH STG INCREASE KNOWLEDGE OF DYSPHAGIA/FLUID INTAKE Description: Patient able to demonstrate appropriate swallowing precautions Outcome: Progressing Goal: RH STG INCREASE KNOWLEGDE OF HYPERLIPIDEMIA Description: Patient able to identify medication for hyperlipidemia.  Outcome: Progressing Goal: RH STG INCREASE KNOWLEDGE OF STROKE PROPHYLAXIS Description: Patient able to state 2 medications he is on for stroke prevention Outcome: Progressing

## 2018-11-21 NOTE — Patient Care Conference (Signed)
Inpatient RehabilitationTeam Conference and Plan of Care Update Date: 11/19/2018   Time: 10:20 AM    Patient Name: Glenn [name]      Medical Record Number: [id number]  Date of Birth: 12-26-1950 Sex: Male         Room/Bed: 4M11C/4M11C-01 Payor Info: Payor: [employer] MEDICARE / Plan: [place] HMO / Product Type: *No Product type* /    Admit Date/Time:  11/13/2018  4:31 PM  Primary Diagnosis:  Stroke (cerebrum) [facility])  Patient Active Problem List   Diagnosis Date Noted  . Acute renal failure superimposed on stage 3a chronic kidney disease ([place])   . Cough with fever   . Headache due to intracranial disease 11/14/2018  . Trochanteric bursitis, right hip 11/14/2018  . Cerebral edema ([place]) 11/13/2018  . Hyperlipidemia LDL goal <70 11/13/2018  . Marijuana user 11/13/2018  . Right middle cerebral artery stroke ([employer]) 11/13/2018  . Acute CVA (cerebrovascular accident) ([place])   . Noncompliance   . Dysphagia, post-stroke   . Acute on chronic combined systolic and diastolic CHF (congestive heart failure) ([place])   . Wide-complex tachycardia ([place])   . DCM (dilated cardiomyopathy) ([place])   . Atrial fibrillation ([place]) 11/04/2018  . Acute on chronic systolic heart failure ([place])   . Prolonged Q-T interval on ECG   . Elevated troponin   . CHF exacerbation ([place]) 10/30/2018  . Acute respiratory failure with hypoxia ([place])   . Acute kidney injury ([place])   . Entrapment of right ulnar nerve 02/28/2018  . Carpal tunnel syndrome of right wrist 02/28/2018  . Impotence due to erectile dysfunction 09/30/2017  . Solitary pulmonary nodule 06/10/2017  . Neck pain 04/06/2017  . Paroxysmal atrial fibrillation ([name])   . Visit for monitoring Tikosyn therapy 03/25/2017  . ICD (implantable cardioverter-defibrillator) in place 09/13/2016  . Chest pain 09/13/2016  . Tobacco abuse 09/13/2016  . Hospital discharge follow-up 09/13/2016  . Upper back pain 09/13/2016  . Housing problems 09/13/2016  . Persistent  atrial fibrillation ([place])   . Atrial fibrillation with RVR ([place])   . Ischemic cardiomyopathy   . Cerebral infarction ([place])   . Stroke (cerebrum) ([name]) Lg L MCA infarct w/ hemorrhagic conversion, embolic d/t AF [id number]  . CHF (congestive heart failure) ([place]) 08/14/2016  . HTN (hypertension) 08/14/2016  . Chronic Hepatitis C  08/14/2016    Expected Discharge Date: Expected Discharge Date: 12/05/18  Team Members Present: Physician leading conference: Dr. Alysia Penna Social Worker Present: Alfonse Alpers, LCSW Nurse Present: Other (comment)(Kishera Joneen Caraway, LPN) PT Present: Apolinar Junes, PT OT Present: Willeen Cass, OT;Roanna Epley, COTA SLP Present: Other (comment)(Erin Tamala Julian, CF-SLP) PPS Coordinator present : Gunnar Fusi, SLP     Current Status/Progress Goal Weekly Team Focus  Bowel/Bladder   continent but incontinent at times.  Timed toileting  Assess toileting needs q shift and PRN   Swallow/Nutrition/ Hydration   Dys 1/thin (solids downgraded), Mod A use of swallow precautions  Supervision A  carryover of swallow stratgies, upgraded trials D2 solids   ADL's   functional transfers-mod A; bathing-mod A; UB/LB dressing-max A; sit<>stand-mod A/min A; standing balance-min/mod A; L inattention althpugh does initiate use of LUE; continues to ehibit some lethargy but does follow one step commands  min A overall  activity tolerance, BADL retraining, sitting balance, standing balance, LUE NMR, safety awareness, functional tranfsers, education   Mobility   Max-mod A squat pivot and sit<>stand with and without RW, gait 14' and 10' with mod A +2 HHA or RW +1,  min A w/c mobility 50'  Min assist w/c level with short min assist gait and min assist 5 steps for home entry  NMR to LUE/LLE, postural control retraining, transfers, initiate gait as able (per acute notes, was walking with RW x 65' with min/mod A +2??), w/c mobility, cognitive remediation   Communication   Mod-Max A (speech  intelligibility ~60-70%)  Supervision A  increase speech intelligibility at phrase level, carryover of speech intelligibility strategies   Safety/Cognition/ Behavioral Observations  Alert and oriented X2 - 3. Transfers with [facility] with 2 assist  No injuries with transfers  Educate pt to call for assistance   Pain   Denies pain on this shift  </= 3  Assess pain q 4 hrs and PRN   Skin   No skin issues noted. Paralysis to left side  Prevent skin breakdown, reposition q 2hrs  Assess skin q shift and PRN    Rehab Goals Patient on target to meet rehab goals: Yes Rehab Goals Revised: none *See Care Plan and progress notes for long and short-term goals.     Barriers to Discharge  Current Status/Progress Possible Resolutions Date Resolved   Nursing                  PT  Home environment access/layout;Lack of/limited family support  5 STE home and roomate to provide assistance at home, need to determine availability of caregiver              OT                  SLP                SW                Discharge Planning/Teaching Needs:  Pt was planning to return to the room he is renting and his landlady will assist him with physical needs.  CSW to confirm this with pt's son and landlady.  Family education will be offered closer to d/c.   Team Discussion:  Pt with neuropsychology evaluation, has been sleeping a lot and staff feels he is depressed.  Team is giving him reassurance and controlling pain.  Pt had been started on Ritalin.  Pt's diet has been downgraded to D1, thin liquids.  SLP stated that he needs constant cueing for chewing.  Pt with min a/S level goals for OT tasks.  Once he is aroused, he easily participates.  Pt is mod A for bathing, max A for dressing, mod A to stand.  Pt has left inattention, but does initiate with his left arm.  Pt fluctuating in PT.  He's min A with txs, max to total with bed mobility, gait training with RW for 14' with max A, but is doing better each day.   Overall min A goals.  Revisions to Treatment Plan:  none    Medical Summary Current Status: UTI- on keflex- will monitor labs/dehydration- forgets to chew per SLP- added Provigil for attention Weekly Focus/Goal: attention  Barriers to Discharge: Behavior;Incontinence;Medication compliance;Neurogenic Bowel & Bladder;Decreased family/caregiver support;Home enviroment access/layout;Medical stability;Other (comments)  Barriers to Discharge Comments: poor intiation Possible Resolutions to Barriers: initiation   Continued Need for Acute Rehabilitation Level of Care: The patient requires daily medical management by a physician with specialized training in physical medicine and rehabilitation for the following reasons: Direction of a multidisciplinary physical rehabilitation program to maximize functional independence : Yes Medical management of patient stability for increased activity during  participation in an intensive rehabilitation regime.: Yes Analysis of laboratory values and/or radiology reports with any subsequent need for medication adjustment and/or medical intervention. : Yes   I attest that I was present, lead the team conference, and concur with the assessment and plan of the team.Team conference was held via web/ teleconference due to [place] - 19.   Osias Resnick, Silvestre Mesi 11/21/2018, 1:29 AM

## 2018-11-21 NOTE — Progress Notes (Signed)
Social Work Patient ID: Glenn [name], male   DOB: 11-19-1950, 68 y.o.   MRN: [id number]   CSW updated pt on team conference discussion and targeted d/c date of 12-05-18.  Pt was minimally talkative with CSW about this, but did give CSW permission to talk with his son and Jackelyn Poling about his care.  CSW is trying to confirm pt's d/c plan with pt ans son.  Pt would not talk with CSW about this, keeping his eyes closed but nodding and grunting answers.  CSW left a message for pt's son to update him and await response back.  Called pt's friend/landlady, Debbie, and she stated that pt has taken her off the list for communication and didn't even want the clothes she brought for him the other day.  CSW explained that pt is doing fine medically, but having a difficult time, so that may be why, however CSW did not give her any specific information after her response until pt's son returns phone call.  CSW will continue to follow and assist as needed.

## 2018-11-21 NOTE — Progress Notes (Signed)
Speech Language Pathology Weekly Progress and Session Note  Patient Details  Name: Glenn [name] MRN: [id number] Date of Birth: May 13, 1950  Beginning of progress report period: November 14, 2018 End of progress report period: November 21, 2018  Today's Date: 11/21/2018 SLP Individual Time: 0832-0930 SLP Individual Time Calculation (min): 58 min  Short Term Goals: Week 1: SLP Short Term Goal 1 (Week 1): Pt will consume current dysphagia 2 solid, thin liquid diet with min overt s/s aspiration, efficient mastication and oral clearance with Mod A verbal cues for use of swallow stategies. SLP Short Term Goal 1 - Progress (Week 1): Not met SLP Short Term Goal 2 (Week 1): Pt will demonstrate efficient mastication and oral clearance with upgraded dysphagia 3 trials prior to solid advancement. SLP Short Term Goal 2 - Progress (Week 1): Not met SLP Short Term Goal 3 (Week 1): Pt will demonstate basic problem solving during familiar functional tasks with Mod A verbal/visual cues. SLP Short Term Goal 3 - Progress (Week 1): Partly met SLP Short Term Goal 4 (Week 1): Pt will sustain attention for ~5 minutes with Mod A cues. SLP Short Term Goal 4 - Progress (Week 1): Progressing toward goal SLP Short Term Goal 5 (Week 1): Pt will recall daily information with Mod A for use of compensatory memory strategies. SLP Short Term Goal 5 - Progress (Week 1): Met SLP Short Term Goal 6 (Week 1): Pt will increase speech intelligibility to ~60% at the phrase level and Mod A for use of clear speech strategies. SLP Short Term Goal 6 - Progress (Week 1): Partly met    New Short Term Goals: Week 2: SLP Short Term Goal 1 (Week 2): Pt will consume current dysphagia 1 solid, thin liquid diet with min overt s/s aspiration, efficient mastication and oral clearance with Mod A verbal cues for use of swallow stategies. SLP Short Term Goal 2 (Week 2): Pt will maintain alertness with Min A and demonstrate efficient  mastication and oral clearance with upgraded dysphagia 2 solid trials prior to solid advancement. SLP Short Term Goal 3 (Week 2): Pt will demonstate basic problem solving during familiar functional tasks with Mod A verbal/visual cues. SLP Short Term Goal 4 (Week 2): Pt will sustain attention for ~5 minutes with Mod A cues. SLP Short Term Goal 5 (Week 2): Pt will recall daily information with Min A for use of compensatory memory strategies. SLP Short Term Goal 6 (Week 2): Pt will increase speech intelligibility to ~75% at the phrase level and Mod A for use of clear speech strategies.  Weekly Progress Updates: Pt has made minimal gains this reporting period due to severe lethargy as well as internal and external distractibility that have limited his full participation in therapies and carryover of newly learn information. As a result, pt partially met 2 of out 6 short term goals this week. Pt is currently Mod-Max assist for basic cognition and speech intelligibility (~70% intelligible at phrase level when provided Max A for use of strategies). Pt was downgraded to Dysphagia 1 (puree) diet during reporting period due to minimal mastication of solids and he required Max A to utilize safe swallow strategies. Pt is also tolerating thin liquids. Pt has demonstrated improved speech intelligibility and scanning left visual field. Pt and family education is ongoing. Pt would continue to benefit from skilled ST while inpatient in order to maximize functional independence and reduce burden of care prior to discharge. Anticipate that pt will need 24/7 supervision at discharge  in addition to ST follow up at next level of care.     Intensity: Minumum of 1-2 x/day, 30 to 90 minutes Frequency: 3 to 5 out of 7 days Duration/Length of Stay: 12/05/18 Treatment/Interventions: Cognitive remediation/compensation;Environmental controls;Internal/external aids;Speech/Language facilitation;Patient/family education;Functional  tasks;Dysphagia/aspiration precaution training;Cueing hierarchy   Daily Session  Skilled Therapeutic Interventions: Pt was seen for skilled ST targeting dysphagia and cognition. Pt demonstrated overall improved ability to maintain alertness throughout session this morning, but continues to make irrelevant comments and is preoccupied with leg soreness despite close pain management provided by RN and medical team. During pt's consumption of current dysphagia 1 breakfast solids, pt required Max A to use swallow strategies efficiently in order to achieve oral clearance on left side of oral cavity. During upgraded trials of dysphagia 2 solids (bite size controlled by SLP) pt still required Mod-Max A verbal cues for awareness of boluses and need to chew, as well as oral clearance. SLP further facilitated session with Mod A multimodal cues for error awareness and correction during a basic PEG board design task. Max A verbal cues provided for redirection provided throughout. Pt was left in wheelchair with seatbelt alarm set and call bell in lap. Continue per current plan of care.       Pain Pain Assessment Pain Scale: Faces Faces Pain Scale: Hurts a little bit Pain Type: Acute pain Pain Location: Leg Pain Orientation: Right Pain Descriptors / Indicators: Sore Pain Onset: On-going Pain Intervention(s): Distraction;Repositioned Multiple Pain Sites: No  Therapy/Group: Individual Therapy  Arbutus Leas 11/21/2018, 7:15 AM

## 2018-11-21 NOTE — Progress Notes (Addendum)
Physical Therapy Weekly Progress Note  Patient Details  Name: Glenn [name] MRN: [id number] Date of Birth: 03/07/1950  Beginning of progress report period: November 14, 2018 End of progress report period: November 21, 2018  Today's Date: 11/21/2018 PT Individual Time: 1115-1200 PT Individual Time Calculation (min): 45 min   Patient has met 2 of 4 short term goals. Patient fluctuates with functional status and has had slow progress towards goals secondary to intermittent lethargy and decreased attention during session. He currently requires mod-max A for bed mobility and transfers, min A for w/c mobility using R hemi-technique, and has initiated gait training up to 10 feet using the RW with mod-max A, however, in his most recent session he was unable to take steps with the RW or a R rails due to poor attention to the task and cues. Will monitor progress to determine if LTG remain appropriate based on patient's progress.   Patient continues to demonstrate the following deficits muscle weakness, decreased cardiorespiratoy endurance, impaired timing and sequencing, abnormal tone, unbalanced muscle activation, decreased coordination and decreased motor planning, decreased visual perceptual skills, decreased midline orientation, decreased attention to left, left side neglect and decreased motor planning, decreased initiation, decreased attention, decreased awareness, decreased problem solving, decreased safety awareness, decreased memory and delayed processing and decreased sitting balance, decreased standing balance, decreased postural control, hemiplegia and decreased balance strategies and therefore will continue to benefit from skilled PT intervention to increase functional independence with mobility.  Patient progressing toward long term goals..  Continue plan of care.  PT Short Term Goals Week 1:  PT Short Term Goal 1 (Week 1): Pt will be able to perform bed mobility with mod assist PT Short  Term Goal 1 - Progress (Week 1): Progressing toward goal PT Short Term Goal 2 (Week 1): Pt will be able to perform bed <> w/c transfers with mod assist PT Short Term Goal 2 - Progress (Week 1): Progressing toward goal PT Short Term Goal 3 (Week 1): Pt will be able to inititate gait training PT Short Term Goal 3 - Progress (Week 1): Met PT Short Term Goal 4 (Week 1): Pt will be able to propel w/c with hemi technique x 50' with min assist PT Short Term Goal 4 - Progress (Week 1): Met Week 2:  PT Short Term Goal 1 (Week 2): Pt will be able to perform bed mobility with mod assist consistently PT Short Term Goal 2 (Week 2): Pt will be able to perform basic transfers with mod assist consistently PT Short Term Goal 3 (Week 2): Pt will ambulate >10 feet usinf LRAD with max A PT Short Term Goal 4 (Week 2): Pt will be able to propel w/c with hemi technique 50' with supervision  Skilled Therapeutic Interventions/Progress Updates:     Patient in w/c upon PT arrival. Patient alert and agreeable to PT session. Upon entry, patient's L arm was hanging over the arm rest in the w/c. PT placed his arm back on the lap tray and patient reported "arthritis" in his L shoulder. PT provided joint approximation and gentle PROM shoulder flexion in the scapular plane and patient reported that his shoulder felt much better after.   Therapeutic Activity: Transfers: Patient performed sit to/from stand x2 with RW and x1 with a R rail with mod. He performed a squat pivot to the L with max A and to the R with mod A w/c<>the NuStep. Provided verbal cues for and hand over-hand assist for placing his L UE   in a L hand splint on the RW, provided cues and manual facilitation for foot placement and hand placement for transfers, and cues for leaning forward to stand/lift hips.  Wheelchair Mobility:  Patient propelled wheelchair 85 feet with min A using R UE and LE hemi-technique. Provided verbal and visual cues with intermittent manual  facilitation for technique of pulling with his R LE to steer and propel.   Neuromuscular Re-ed: Patient performed standing balance and weight shifts focusing on alignment in standing 3x1-2 min. Provided multi-modal cues and manual facilitation for shift hips and shoulder to the R, L knee extension, and trunk elongation on the L in standing. Patient would attend to task ~30 sec before needing heavy cues to return focus to therapist's cues.  He used the NuStep to promote reciprocal pattern in LEs and strengthening on L LE x2 min with resistance set to 3. Patient required heavy cues to attend to the task and to keep his eyes open during the task.  Noted decreased attention to L side and visual field this session. At end of session, patient could not find the TV in the room without cues and required verbal and visual cues to locate the TV. He also had increased dysarthria when speaking today compared to earlier this week.  Patient in w/c with L lap tray at end of session with breaks locked, seat belt alarm set, and all needs within reach.    Therapy Documentation Precautions:  Precautions Precautions: Fall, ICD/Pacemaker Precaution Comments: L hemi; L inattention Restrictions Weight Bearing Restrictions: No Pain: Pain Assessment Pain Scale: Faces Faces Pain Scale: Hurts a little bit Pain Type: Acute pain Pain Location: Leg Pain Orientation: Right Pain Descriptors / Indicators: Sore Pain Onset: On-going Pain Intervention(s): Distraction;Repositioned Multiple Pain Sites: No   Therapy/Group: Individual Therapy  Cherie L Grunenberg PT, DPT  11/21/2018, 12:20 PM  

## 2018-11-21 NOTE — Progress Notes (Signed)
Speech Language Pathology Daily Session Note  Patient Details  Name: Glenn [name] MRN: [id number] Date of Birth: 12-25-1950  Today's Date: 11/21/2018 SLP Individual Time: 1430-1500 SLP Individual Time Calculation (min): 30 min  Short Term Goals: Week 2: SLP Short Term Goal 1 (Week 2): Pt will consume current dysphagia 1 solid, thin liquid diet with min overt s/s aspiration, efficient mastication and oral clearance with Mod A verbal cues for use of swallow stategies. SLP Short Term Goal 2 (Week 2): Pt will maintain alertness with Min A and demonstrate efficient mastication and oral clearance with upgraded dysphagia 2 solid trials prior to solid advancement. SLP Short Term Goal 3 (Week 2): Pt will demonstate basic problem solving during familiar functional tasks with Mod A verbal/visual cues. SLP Short Term Goal 4 (Week 2): Pt will sustain attention for ~5 minutes with Mod A cues. SLP Short Term Goal 5 (Week 2): Pt will recall daily information with Min A for use of compensatory memory strategies. SLP Short Term Goal 6 (Week 2): Pt will increase speech intelligibility to ~75% at the phrase level and Mod A for use of clear speech strategies.  Skilled Therapeutic Interventions: Pt was seen for skilled ST targeting communication goals. Pt recalled 2 clear speech strategies with Min A question cues, however required Max A to locate external aid posted in room to recall remaining strategies. Mod-Max A verbal cues are still required for pt to utilize increased vocal intensity, slow rate, and overarticulation to achieve ~70% intelligibility at the phrase level during picture description tasks and answering SLP's question in informal conversation. Intelligibility significantly decreases at the sentence level. Pt was left in bed with alarm set and all needs within reach. Continue per current plan of care.      Pain Pain Assessment Pain Score: 0-No pain  Therapy/Group: Individual Therapy  Arbutus Leas 11/21/2018, 3:07 PM

## 2018-11-21 NOTE — Progress Notes (Signed)
Occupational Therapy Session Note  Patient Details  Name: Glenn [name] MRN: [id number] Date of Birth: Dec 07, 1950  Today's Date: 11/21/2018 OT Individual Time: 0700-0755 OT Individual Time Calculation (min): 55 min    Short Term Goals: Week 2:  OT Short Term Goal 1 (Week 2): Pt will don shirt with mod A OT Short Term Goal 2 (Week 2): Pt will don pants with max A sit to stand with one caregiver OT Short Term Goal 3 (Week 2): Pt will demonstrate sustained attention to ADL task with min cues. OT Short Term Goal 4 (Week 2): Pt will complete toileting tasks with max A  Skilled Therapeutic Interventions/Progress Updates:    Pt resting in bed upon arrival and greeted therapist appropriately.  Pt stated he wasn't sure if he could sit up on EOB this morning but did not offer reason for remark.  Pt required mod A for supine>sit EOB and maintained sitting balance with supervision. IV running so pt elected not to change clothing this morning.  OT intervention with focus on sit<>stand, standing balance, LUR NMR, L inattention, and safety awareness to increase independence with BADLs. Pt noted with significant R gaze preference when sitting EOB. Pt with minimal shoulder, elbow, and hand active movement this morning.  Slight biceps tone noted.  Sit<>stand with mod A and min/mod A for standing balance at sink for grooming tasks.  Pt requires max verbal cues for task initiation and safety awareness.  Pt continues to exhibit significant internal distraction requiring max verbal cues to redirect. Pt remained seated in w/c with all needs within reach, belt alarm activated, and half lap tray in place.   Therapy Documentation Precautions:  Precautions Precautions: Fall, ICD/Pacemaker Precaution Comments: L hemi; L inattention Restrictions Weight Bearing Restrictions: No Pain:  Pt stated his L eye was painful and requested warm wet wash cloth   Therapy/Group: Individual Therapy  Leroy Libman 11/21/2018, 8:01 AM

## 2018-11-21 NOTE — Progress Notes (Signed)
[facility] PHYSICAL MEDICINE & REHABILITATION PROGRESS NOTE   Subjective/Complaints:   Pt reportsL eyelid still hurts as does L cheekbone right under L eye- wants to cream to help L cheek pain.     ROS: Limited due to cognitive/behavioral    Objective:   No results found. Recent Labs    11/20/18 0505  WBC 5.7  HGB 12.6*  HCT 38.8*  PLT 202   Recent Labs    11/20/18 0505 11/21/18 0616  NA 142 141  K 4.8 4.9  CL 107 109  CO2 25 20*  GLUCOSE 112* 115*  BUN 34* 33*  CREATININE 1.79* 1.43*  CALCIUM 9.0 8.8*    Intake/Output Summary (Last 24 hours) at 11/21/2018 0944 Last data filed at 11/21/2018 [id number] Gross per 24 hour  Intake 1193.36 ml  Output 800 ml  Net 393.36 ml     Physical Exam: Vital Signs Blood pressure (!) 122/58, pulse 80, temperature 98.5 F (36.9 C), temperature source Oral, resp. rate 18, height 5\' 7"  (1.702 m), weight 85.5 kg, SpO2 95 %.  Constitutional: No distress . Vital signs reviewed and labs reviewed Sitting up; much more awake- still poor initiation; in chair at bedside, NAD HEENT: EOMI, L eyelid appears to have a sty on top of lid, but almost gone- no localized swelling anymore- just generalized lid swelling,- conjunctiva slightly erythematous and TTP over L cheekbone Neck: supple Cardiovascular: RRR without murmur. Respiratory:CTA B/L GI: BS +, non-tender, non-distended   Musculoskeletal:     Comments: No edema LE's  Neurological:  Awakens, follows basic commands when awake Moderate to severe dysarthria; some apraxia and poor initiation Follows simple commands with cues Right facial weakness Right upper extremity: 5/5 proximal distal Right lower extremity: 4-4+/5 proximal distal Left upper extremity: 1+-2-/5 proximal distal Left lower extremity: 3-/5 proximal to distal  Skin: Skin is warm and dry. Dry Psychiatric: delayed, cooperative; very poor initiation  Assessment/Plan: 1. Functional deficits secondary to R MCA infarct with  L hemiparesis which require 3+ hours per day of interdisciplinary therapy in a comprehensive inpatient rehab setting.  Physiatrist is providing close team supervision and 24 hour management of active medical problems listed below.  Physiatrist and rehab team continue to assess barriers to discharge/monitor patient progress toward functional and medical goals  Care Tool:  Bathing    Body parts bathed by patient: Face, Abdomen, Chest   Body parts bathed by helper: Right arm, Left arm, Front perineal area, Buttocks, Right upper leg, Left upper leg, Right lower leg, Left lower leg     Bathing assist Assist Level: Total Assistance - Patient < 25%     Upper Body Dressing/Undressing Upper body dressing   What is the patient wearing?: Pull over shirt    Upper body assist Assist Level: Maximal Assistance - Patient 25 - 49%    Lower Body Dressing/Undressing Lower body dressing      What is the patient wearing?: Underwear/pull up, Pants     Lower body assist Assist for lower body dressing: Total Assistance - Patient < 25%     Toileting Toileting    Toileting assist Assist for toileting: Dependent - Patient 0%     Transfers Chair/bed transfer  Transfers assist     Chair/bed transfer assist level: Maximal Assistance - Patient 25 - 49%     Locomotion Ambulation   Ambulation assist   Ambulation activity did not occur: Safety/medical concerns(unsafe due to inability to stand; letheray and low BP)  Assist level: Maximal Assistance -  Patient 25 - 49% Assistive device: Walker-rolling Max distance: 14   Walk 10 feet activity   Assist  Walk 10 feet activity did not occur: Safety/medical concerns  Assist level: Maximal Assistance - Patient 25 - 49% Assistive device: Walker-rolling   Walk 50 feet activity   Assist Walk 50 feet with 2 turns activity did not occur: Safety/medical concerns         Walk 150 feet activity   Assist Walk 150 feet activity did not  occur: Safety/medical concerns         Walk 10 feet on uneven surface  activity   Assist Walk 10 feet on uneven surfaces activity did not occur: Safety/medical concerns         Wheelchair     Assist   Type of Wheelchair: Manual    Wheelchair assist level: Minimal Assistance - Patient > 75% Max wheelchair distance: 39'    Wheelchair 50 feet with 2 turns activity    Assist        Assist Level: Minimal Assistance - Patient > 75%   Wheelchair 150 feet activity     Assist      Assist Level: Dependent - Patient 0%   Blood pressure (!) 122/58, pulse 80, temperature 98.5 F (36.9 C), temperature source Oral, resp. rate 18, height 5\' 7"  (1.702 m), weight 85.5 kg, SpO2 95 %.  1.  Left-sided weakness with facial droop and dysphagia secondary to large right MCA infarction with hemorrhagic conversion, embolic secondary to known A. fib on Eliquis as well as history of left MCA infarction 2018 status post thrombectomy for left M2 occlusion.  Anticoagulation on hold due to hemorrhage  930- will try Provigil for attention- can't start Ritalin per QT prolongation nor Amantadine for same reason             -Continue CIR therapies including PT, OT, and SLP  2.  Antithrombotics: -DVT/anticoagulation: Subcutaneous heparin initiated 11/11/2018  9/25- changed to Lovenox             Monitor for bleeding             -antiplatelet therapy: N/A 3. Pain Management: Neurontin 300 mg 3 times daily,tramadol as needed  9/25- will add Depakote 125 mg BID for headache since had new stroke.- seems to be helping--observe for neurosedation however 4. Mood: Valium 5 mg twice daily as needed anxiety             -antipsychotic agents: N/A 5. Neuropsych: This patient is not fully capable of making decisions on his own behalf. 6. Skin/Wound Care: Routine skin checks 7. Fluids/Electrolytes/Nutrition: Routine I/Os              . 8.  Atrial fibrillation status post ICD.  Follow cardiology  services.  Patient with recent cardioversion.  Cardiac rate controlled 9.  Acute on chronic diastolic congestive heart failure.  Lasix 40 mg daily.  Monitor for any signs of fluid overload             Daily weights needed   Filed Weights   11/19/18 0500 11/20/18 0546 11/21/18 0500  Weight: 83.9 kg 85.6 kg 85.5 kg    10.  Nonischemic cardiomyopathy.  Ejection fraction 15 to 20%.             See #9 11.  Hypertension.  Coreg 25 mg twice daily, Aldactone 12.5 mg daily, Entresto 24-26 mg twice daily  9/25- will decrease Coreg to 12.5 mg BID since BP is running  so low/100/58 this AM             9/27--bp improved  9/29- BP doing OK [id number] systolic 12.  Hyperlipidemia.  Lipitor 13.  Post stroke dysphagia.  Dysphasia #2 thin liquids.  Follow-up speech therapy 14.  AKI.  Creatinine 1.23-1.54                9/25- Cr 1.46- in baseline  9/28- will recheck in AM  9/29- Cr 1.38- which is baseline.  10/1- Cr up significantly to 1.79 and Bun up to 34- will give IVFs   NS 60cc/hr x 24 hrs and recheck labs in AM  10/2- Cr down to 1.43- baseline- will stop IVFs 15.  History of tobacco/marijuana abuse as well as history of remote cocaine use.  Provide counseling 16.  Medical noncompliance.  Counseling 17.  Chronic hep C.  Follow-up outpatient 18. R trochanteric bursitis-  9/25- ordered lidocaine patch for R lateral hip  -ROM/Ice 19. Fever: Tmx 101  -check ua/ucx, cxr  -left eye doesn't appear infected but given temp, will treat for blepharitis for now pending urine and chest  -continue to monitor  9/28- rechecked CXR since so junky- is (-)   9/29- U/A (+) for nitrites- waiting for sensitivities, so can start ABX.; will start warm compressed for L eyelid sty which appeared in last 1-2 days.  9/30- sty appears less distinct/less swollen  10/2- on Keflex for UTI; will add lidocaine cream to L cheek for pain.  20 . Poor initiation  9/29- cannot start Amantadine due to severely prolonged QT interval-  will have to wait on med changes.  9/30 added Provigil 100 mg daily  10/1- changed to 6am so more awake for day- did help this AM  10/2- still an issue- can't start meds due to QT prolongation.   LOS: 8 days A FACE TO FACE EVALUATION WAS PERFORMED  Glenn [name] 11/21/2018, 9:44 AM

## 2018-11-22 ENCOUNTER — Inpatient Hospital Stay (HOSPITAL_COMMUNITY): Payer: Medicare HMO | Admitting: Physical Therapy

## 2018-11-22 DIAGNOSIS — G8929 Other chronic pain: Secondary | ICD-10-CM

## 2018-11-22 DIAGNOSIS — R519 Headache, unspecified: Secondary | ICD-10-CM

## 2018-11-22 DIAGNOSIS — M25551 Pain in right hip: Secondary | ICD-10-CM

## 2018-11-22 MED ORDER — SODIUM CHLORIDE 0.9 % IV SOLN
INTRAVENOUS | Status: AC
  Administered 2018-11-22: 23:00:00 via INTRAVENOUS

## 2018-11-22 MED ORDER — TRAMADOL HCL 50 MG PO TABS
50.0000 mg | ORAL_TABLET | Freq: Three times a day (TID) | ORAL | Status: DC | PRN
  Administered 2018-11-22 – 2018-11-28 (×5): 50 mg via ORAL
  Filled 2018-11-22 (×7): qty 1

## 2018-11-22 MED ORDER — SODIUM CHLORIDE 0.9 % IV SOLN
1.0000 g | INTRAVENOUS | Status: AC
  Administered 2018-11-22 – 2018-11-26 (×5): 1 g via INTRAVENOUS
  Filled 2018-11-22 (×2): qty 10
  Filled 2018-11-22 (×3): qty 1
  Filled 2018-11-22: qty 10

## 2018-11-22 NOTE — Plan of Care (Signed)
Problem: RH BOWEL ELIMINATION Goal: RH STG MANAGE BOWEL WITH ASSISTANCE Description: STG Manage Bowel with min Assistance. 11/22/2018 1730 by Marney Setting, RN Outcome: Progressing 11/22/2018 1730 by Marney Setting, RN Outcome: Progressing Goal: RH STG MANAGE BOWEL W/MEDICATION W/ASSISTANCE Description: STG Manage Bowel with Medication with min Assistance. 11/22/2018 1730 by Marney Setting, RN Outcome: Progressing 11/22/2018 1730 by Marney Setting, RN Outcome: Progressing   Problem: RH BLADDER ELIMINATION Goal: RH STG MANAGE BLADDER WITH ASSISTANCE Description: STG Manage Bladder With min Assistance 11/22/2018 1730 by Marney Setting, RN Outcome: Progressing 11/22/2018 1730 by Marney Setting, RN Outcome: Progressing Goal: RH STG MANAGE BLADDER WITH EQUIPMENT WITH ASSISTANCE Description: STG Manage Bladder With Equipment With min Assistance 11/22/2018 1730 by Marney Setting, RN Outcome: Progressing 11/22/2018 1730 by Marney Setting, RN Outcome: Progressing   Problem: RH SAFETY Goal: RH STG ADHERE TO SAFETY PRECAUTIONS W/ASSISTANCE/DEVICE Description: STG Adhere to Safety Precautions With min Assistance/Device. 11/22/2018 1730 by Marney Setting, RN Outcome: Progressing 11/22/2018 1730 by Marney Setting, RN Outcome: Progressing Goal: RH STG DECREASED RISK OF FALL WITH ASSISTANCE Description: STG Decreased Risk of Fall With min Assistance. 11/22/2018 1730 by Marney Setting, RN Outcome: Progressing 11/22/2018 1730 by Marney Setting, RN Outcome: Progressing   Problem: RH PAIN MANAGEMENT Goal: RH STG PAIN MANAGED AT OR BELOW PT'S PAIN GOAL Description: Less than 3 out of 10 11/22/2018 1730 by Marney Setting, RN Outcome: Progressing 11/22/2018 1730 by Marney Setting, RN Outcome: Progressing   Problem: Consults Goal: RH STROKE PATIENT EDUCATION Description: See Patient Education module for education specifics  11/22/2018 1730 by Marney Setting,  RN Outcome: Not Progressing 11/22/2018 1730 by Marney Setting, RN Outcome: Progressing Goal: Nutrition Consult-if indicated 11/22/2018 1730 by Marney Setting, RN Outcome: Not Progressing 11/22/2018 1730 by Marney Setting, RN Outcome: Progressing Goal: Diabetes Guidelines if Diabetic/Glucose > 140 Description: If diabetic or lab glucose is > 140 mg/dl - Initiate Diabetes/Hyperglycemia Guidelines & Document Interventions  11/22/2018 1730 by Marney Setting, RN Outcome: Not Progressing 11/22/2018 1730 by Marney Setting, RN Outcome: Progressing   Problem: RH SKIN INTEGRITY Goal: RH STG SKIN FREE OF INFECTION/BREAKDOWN 11/22/2018 1730 by Marney Setting, RN Outcome: Not Progressing 11/22/2018 1730 by Marney Setting, RN Outcome: Progressing Goal: RH STG MAINTAIN SKIN INTEGRITY WITH ASSISTANCE Description: STG Maintain Skin Integrity With mod I Assistance. 11/22/2018 1730 by Marney Setting, RN Outcome: Not Progressing 11/22/2018 1730 by Marney Setting, RN Outcome: Progressing   Problem: RH COGNITION-NURSING Goal: RH STG USES MEMORY AIDS/STRATEGIES W/ASSIST TO PROBLEM SOLVE Description: STG Uses Memory Aids/Strategies With min Assistance to Problem Solve. 11/22/2018 1730 by Marney Setting, RN Outcome: Not Progressing 11/22/2018 1730 by Marney Setting, RN Outcome: Progressing Goal: RH STG ANTICIPATES NEEDS/CALLS FOR ASSIST W/ASSIST/CUES Description: STG Anticipates Needs/Calls for Assist With min Assistance/Cues. 11/22/2018 1730 by Marney Setting, RN Outcome: Not Progressing 11/22/2018 1730 by Marney Setting, RN Outcome: Progressing   Problem: RH KNOWLEDGE DEFICIT Goal: RH STG INCREASE KNOWLEDGE OF HYPERTENSION Description: Patient able to state 2 methods in controlling hypertension 11/22/2018 1730 by Marney Setting, RN Outcome: Not Progressing 11/22/2018 1730 by Marney Setting, RN Outcome: Progressing Goal: RH STG INCREASE KNOWLEDGE OF DYSPHAGIA/FLUID  INTAKE Description: Patient able to demonstrate appropriate swallowing precautions 11/22/2018 1730 by Marney Setting, RN Outcome: Not Progressing 11/22/2018 1730 by Marney Setting, RN Outcome: Progressing Goal: RH STG INCREASE KNOWLEGDE OF  HYPERLIPIDEMIA Description: Patient able to identify medication for hyperlipidemia.  11/22/2018 1730 by Marney Setting, RN Outcome: Not Progressing 11/22/2018 1730 by Marney Setting, RN Outcome: Progressing Goal: RH STG INCREASE KNOWLEDGE OF STROKE PROPHYLAXIS Description: Patient able to state 2 medications he is on for stroke prevention 11/22/2018 1730 by Marney Setting, RN Outcome: Not Progressing 11/22/2018 1730 by Marney Setting, RN Outcome: Progressing

## 2018-11-22 NOTE — Progress Notes (Signed)
Pt appears extremely drowsy today and requires max cues to take medication and swallow. Pt refused both breakfast and lunch even with a lot of encouragement. Therapy reported that patient was falling asleep during session as well.

## 2018-11-22 NOTE — Progress Notes (Addendum)
Physical Therapy Session Note  Patient Details  Name: Glenn [name] MRN: [id number] Date of Birth: [birth year]  Today's Date: 11/22/2018 PT Individual Time: [id number] PT Individual Time Calculation (min): 44 min  And  (Addendum: added missed time) Today's Date: 11/22/2018 PT Missed Time: 16 Minutes Missed Time Reason: Patient fatigue   Short Term Goals: Week 2:  PT Short Term Goal 1 (Week 2): Pt will be able to perform bed mobility with mod assist consistently PT Short Term Goal 2 (Week 2): Pt will be able to perform basic transfers with mod assist consistently PT Short Term Goal 3 (Week 2): Pt will ambulate >10 feet usinf LRAD with max A PT Short Term Goal 4 (Week 2): Pt will be able to propel w/c with hemi technique 84' with supervision  Skilled Therapeutic Interventions/Progress Updates:    Upon arrival to room patient supine, asleep in bed requiring increased verbal cuing for arousal and pt continued to maintain eyes closed nodding head yes/no in response to therapist's questions with pt agreeable to therapy session. Donned pants and TED hose max/total assist with cuing for increased pt participation. Pt reported L hip pain when threading LE into the pants - therapist performed PROM hip/knee flexion/extension but pt reports this is increasing his pain - RN notified and said due to his drowsiness she has not yet administered pain medication - therapist provided seated rest breaks and repositioning for pain management during session. Supine>sit, using bedrails, with heavy max assist for B LE management and trunk upright with multimodal cuing for sequencing of task. Able to maintain static sitting balance EOB using R UE support with CGA/min assist. Pt continued to be drowsy, keeping eyes shut and requiring increased verbal cuing for alertness throughout session. Sit>stand EOB>RW with heavy +2 mod assist for lifting into standing and balance due to heavy L lateral trunk lean. Pt unable to  grasp RW with L UE due to heavy L lateral lean therefore transitioned to R HHA. Stand pivot to w/c with heavy +2 mod assist for balance and maintaining upright - manual facilitation and multimodal cuing for sequencing and stepping.  Transported to/from gym in w/c for energy conservation. Sit>stand w/c>R hallway rail with mod assist for lifting into standing. Ambulated ~3ft with R UE support on hallway rail and mod assist for balance, forward stepping with L LE during swing, and for L knee control during stance - +2 assist for close w/c follow - pt reports L hip pain during this task and initiates return to sitting. Pt continues to demonstrate drowsiness with keeping his eyes close despite encouragement and motivation for increased energy therefore pt transported back to room in w/c. R squat pivot w/c>EOB with +2 max assist for lifting and pivoting hips as well as max multimodal cuing for sequencing. Sit>supine with +2 max assist for trunk descent and B LE management. Pt left supine in bed with needs in reach, L UE elevated, and bed alarm on. Patient missed 16 minutes of skilled physical therapy.  RN notified of pt's increased drowsiness/lethargy today.  Therapy Documentation Precautions:  Precautions Precautions: Fall, ICD/Pacemaker Precaution Comments: L hemi; L inattention Restrictions Weight Bearing Restrictions: No  Pain: Reports L posterior hip pain that increases with L LE movement and weight bearing - RN notified - therapist provided seated rest break and repositioning for pain management during session - details above.    Therapy/Group: [place], PT,DPT 11/22/2018, 1:01 PM

## 2018-11-22 NOTE — Progress Notes (Signed)
[facility] PHYSICAL MEDICINE & REHABILITATION PROGRESS NOTE   Subjective/Complaints:   Pt reports R hip/groin is still hurting- when mentioned xray, he said "already did one"- will order prn pain meds for it.  Also noted lips were dry and pt reached for/asked for water- gave him- drank 1/4 of cup. Didn't eat much of breakfast- said didnd't have an appetite.   ROS: Limited due to cognitive/behavioral    Objective:   No results found. Recent Labs    11/20/18 0505  WBC 5.7  HGB 12.6*  HCT 38.8*  PLT 202   Recent Labs    11/20/18 0505 11/21/18 0616  NA 142 141  K 4.8 4.9  CL 107 109  CO2 25 20*  GLUCOSE 112* 115*  BUN 34* 33*  CREATININE 1.79* 1.43*  CALCIUM 9.0 8.8*    Intake/Output Summary (Last 24 hours) at 11/22/2018 1157 Last data filed at 11/22/2018 0730 Gross per 24 hour  Intake 180 ml  Output 378 ml  Net -198 ml     Physical Exam: Vital Signs Blood pressure 113/79, pulse (!) 58, temperature 98.3 F (36.8 C), temperature source Oral, resp. rate 18, height 5\' 7"  (1.702 m), weight 84.7 kg, SpO2 99 %.  Constitutional: No distress . Vital signs reviewed  reviewed Sitting up; much more awake- finished <25% of breakfast, lips very dry/chapped, poor initiation, NAD HEENT: neck supple, lips very chapped/peeling Cardiovascular: RRR without murmur. Respiratory:CTA B/L GI: BS +, non-tender, non-distended   Musculoskeletal:     Comments: No edema LE's; TTP over R inguinal area c/w R hip pain. Neurological:  Awakens, follows basic commands when awake, but no spontaneous actions.  Moderate to severe dysarthria; some apraxia and poor initiation Follows simple commands with cues Right facial weakness Right upper extremity: 5/5 proximal distal Right lower extremity: 4-4+/5 proximal distal Left upper extremity: 1+-2-/5 proximal distal Left lower extremity: 3-/5 proximal to distal  Skin: Skin is warm and dry. Dry Psychiatric: delayed, cooperative; very poor  initiation  Assessment/Plan: 1. Functional deficits secondary to R MCA infarct with L hemiparesis which require 3+ hours per day of interdisciplinary therapy in a comprehensive inpatient rehab setting.  Physiatrist is providing close team supervision and 24 hour management of active medical problems listed below.  Physiatrist and rehab team continue to assess barriers to discharge/monitor patient progress toward functional and medical goals  Care Tool:  Bathing    Body parts bathed by patient: Face, Abdomen, Chest   Body parts bathed by helper: Right arm, Left arm, Front perineal area, Buttocks, Right upper leg, Left upper leg, Right lower leg, Left lower leg     Bathing assist Assist Level: Total Assistance - Patient < 25%     Upper Body Dressing/Undressing Upper body dressing   What is the patient wearing?: Pull over shirt    Upper body assist Assist Level: Maximal Assistance - Patient 25 - 49%    Lower Body Dressing/Undressing Lower body dressing      What is the patient wearing?: Underwear/pull up, Pants     Lower body assist Assist for lower body dressing: Total Assistance - Patient < 25%     Toileting Toileting    Toileting assist Assist for toileting: Dependent - Patient 0%     Transfers Chair/bed transfer  Transfers assist     Chair/bed transfer assist level: Maximal Assistance - Patient 25 - 49%     Locomotion Ambulation   Ambulation assist   Ambulation activity did not occur: Safety/medical concerns(unsafe due  to inability to stand; letheray and low BP)  Assist level: Maximal Assistance - Patient 25 - 49% Assistive device: Walker-rolling Max distance: 14   Walk 10 feet activity   Assist  Walk 10 feet activity did not occur: Safety/medical concerns  Assist level: Maximal Assistance - Patient 25 - 49% Assistive device: Walker-rolling   Walk 50 feet activity   Assist Walk 50 feet with 2 turns activity did not occur: Safety/medical  concerns         Walk 150 feet activity   Assist Walk 150 feet activity did not occur: Safety/medical concerns         Walk 10 feet on uneven surface  activity   Assist Walk 10 feet on uneven surfaces activity did not occur: Safety/medical concerns         Wheelchair     Assist   Type of Wheelchair: Manual    Wheelchair assist level: Minimal Assistance - Patient > 75% Max wheelchair distance: 74'    Wheelchair 50 feet with 2 turns activity    Assist        Assist Level: Minimal Assistance - Patient > 75%   Wheelchair 150 feet activity     Assist      Assist Level: Dependent - Patient 0%   Blood pressure 113/79, pulse (!) 58, temperature 98.3 F (36.8 C), temperature source Oral, resp. rate 18, height 5\' 7"  (1.702 m), weight 84.7 kg, SpO2 99 %.  1.  Left-sided weakness with facial droop and dysphagia secondary to large right MCA infarction with hemorrhagic conversion, embolic secondary to known A. fib on Eliquis as well as history of left MCA infarction 2018 status post thrombectomy for left M2 occlusion.  Anticoagulation on hold due to hemorrhage  930- will try Provigil for attention- can't start Ritalin per QT prolongation nor Amantadine for same reason             -Continue CIR therapies including PT, OT, and SLP  2.  Antithrombotics: -DVT/anticoagulation: Subcutaneous heparin initiated 11/11/2018  9/25- changed to Lovenox             Monitor for bleeding             -antiplatelet therapy: N/A 3. Pain Management: Neurontin 300 mg 3 times daily,tramadol as needed  9/25- will add Depakote 125 mg BID for headache since had new stroke.- seems to be helping--observe for neurosedation however 4. Mood: Valium 5 mg twice daily as needed anxiety             -antipsychotic agents: N/A 5. Neuropsych: This patient is not fully capable of making decisions on his own behalf. 6. Skin/Wound Care: Routine skin checks 7. Fluids/Electrolytes/Nutrition:  Routine I/Os              . 8.  Atrial fibrillation status post ICD.  Follow cardiology services.  Patient with recent cardioversion.  Cardiac rate controlled 9.  Acute on chronic diastolic congestive heart failure.  Lasix 40 mg daily.  Monitor for any signs of fluid overload             Daily weights needed   Filed Weights   11/20/18 0546 11/21/18 0500 11/22/18 0459  Weight: 85.6 kg 85.5 kg 84.7 kg    10.  Nonischemic cardiomyopathy.  Ejection fraction 15 to 20%.             See #9 11.  Hypertension.  Coreg 25 mg twice daily, Aldactone 12.5 mg daily, Entresto 24-26 mg twice  daily  9/25- will decrease Coreg to 12.5 mg BID since BP is running so low/100/58 this AM             9/27--bp improved  9/29- BP doing OK [id number] systolic 12.  Hyperlipidemia.  Lipitor 13.  Post stroke dysphagia.  Dysphasia #2 thin liquids.  Follow-up speech therapy 14.  AKI.  Creatinine 1.23-1.54                9/25- Cr 1.46- in baseline  9/28- will recheck in AM  9/29- Cr 1.38- which is baseline.  10/1- Cr up significantly to 1.79 and Bun up to 34- will give IVFs   NS 60cc/hr x 24 hrs and recheck labs in AM  10/2- Cr down to 1.43- baseline- will stop IVFs 15.  History of tobacco/marijuana abuse as well as history of remote cocaine use.  Provide counseling 16.  Medical noncompliance.  Counseling 17.  Chronic hep C.  Follow-up outpatient 18. R trochanteric bursitis/R groin pain/Hip pain-  9/25- ordered lidocaine patch for R lateral hip  -ROM/Ice  10/3- no xray in computer- will check one; also will encourage nursing to use tylenol/tramadol with R hip pain 19. Fever: Tmx 101  -check ua/ucx, cxr  -left eye doesn't appear infected but given temp, will treat for blepharitis for now pending urine and chest  -continue to monitor  9/28- rechecked CXR since so junky- is (-)   9/29- U/A (+) for nitrites- waiting for sensitivities, so can start ABX.; will start warm compressed for L eyelid sty which appeared in  last 1-2 days.  9/30- sty appears less distinct/less swollen  10/2- on Keflex for UTI; will add lidocaine cream to L cheek for pain.  20 . Poor initiation  9/29- cannot start Amantadine due to severely prolonged QT interval- will have to wait on med changes.  9/30 added Provigil 100 mg daily  10/1- changed to 6am so more awake for day- did help this AM  10/2- still an issue- can't start meds due to QT prolongation.   LOS: 9 days A FACE TO FACE EVALUATION WAS PERFORMED  Brycin Kille 11/22/2018, 11:57 AM

## 2018-11-23 LAB — CBC WITH DIFFERENTIAL/PLATELET
Abs Immature Granulocytes: 0.03 10*3/uL (ref 0.00–0.07)
Basophils Absolute: 0 10*3/uL (ref 0.0–0.1)
Basophils Relative: 0 %
Eosinophils Absolute: 0.2 10*3/uL (ref 0.0–0.5)
Eosinophils Relative: 2 %
HCT: 41.1 % (ref 39.0–52.0)
Hemoglobin: 13.4 g/dL (ref 13.0–17.0)
Immature Granulocytes: 0 %
Lymphocytes Relative: 19 %
Lymphs Abs: 1.3 10*3/uL (ref 0.7–4.0)
MCH: 31.8 pg (ref 26.0–34.0)
MCHC: 32.6 g/dL (ref 30.0–36.0)
MCV: 97.6 fL (ref 80.0–100.0)
Monocytes Absolute: 0.8 10*3/uL (ref 0.1–1.0)
Monocytes Relative: 12 %
Neutro Abs: 4.5 10*3/uL (ref 1.7–7.7)
Neutrophils Relative %: 67 %
Platelets: 198 10*3/uL (ref 150–400)
RBC: 4.21 MIL/uL — ABNORMAL LOW (ref 4.22–5.81)
RDW: 12.1 % (ref 11.5–15.5)
WBC: 6.8 10*3/uL (ref 4.0–10.5)
nRBC: 0 % (ref 0.0–0.2)

## 2018-11-23 LAB — BASIC METABOLIC PANEL
Anion gap: 10 (ref 5–15)
BUN: 21 mg/dL (ref 8–23)
CO2: 24 mmol/L (ref 22–32)
Calcium: 9 mg/dL (ref 8.9–10.3)
Chloride: 105 mmol/L (ref 98–111)
Creatinine, Ser: 1.22 mg/dL (ref 0.61–1.24)
GFR calc Af Amer: >60 mL/min (ref >60)
GFR calc non Af Amer: >60 mL/min (ref >60)
Glucose, Bld: 113 mg/dL — ABNORMAL HIGH (ref 70–99)
Potassium: 4.6 mmol/L (ref 3.5–5.1)
Sodium: 139 mmol/L (ref 135–145)

## 2018-11-23 MED ORDER — DIVALPROEX SODIUM 125 MG PO CSDR
250.0000 mg | DELAYED_RELEASE_CAPSULE | Freq: Two times a day (BID) | ORAL | Status: DC
  Administered 2018-11-23 – 2018-11-28 (×11): 250 mg via ORAL
  Filled 2018-11-23 (×11): qty 2

## 2018-11-23 NOTE — Progress Notes (Signed)
[place] PHYSICAL MEDICINE & REHABILITATION PROGRESS NOTE   Subjective/Complaints:   Pt sleeping- can be aroused, but said wants ot sleep- per staff, more awake than yesterday- had fever of 102 last night- ABX change to Rocephin from Keflex- per staff, not really eating- only taking a few bites- can get him to drink a little more than eating.  Afebrile this AM- 98.3- .   ROS: Limited due to cognitive/behavioral    Objective:   No results found. No results for input(s): WBC, HGB, HCT, PLT in the last 72 hours. Recent Labs    11/21/18 0616  NA 141  K 4.9  CL 109  CO2 20*  GLUCOSE 115*  BUN 33*  CREATININE 1.43*  CALCIUM 8.8*    Intake/Output Summary (Last 24 hours) at 11/23/2018 1143 Last data filed at 11/23/2018 0751 Gross per 24 hour  Intake 300 ml  Output 400 ml  Net -100 ml     Physical Exam: Vital Signs Blood pressure 111/60, pulse 61, temperature 98.3 F (36.8 C), resp. rate 16, height 5\' 7"  (1.702 m), weight 83.1 kg, SpO2 100 %.  Constitutional: No distress . Vital signs reviewed  reviewed Sitting up sleeping with head nodding trying to stay asleep; tray gone, but per nurse didn't eat much, NAD HEENT: neck supple, lips less chapped Cardiovascular: irregularly irregular- rate controlled; (+) murmur more notable Respiratory:CTA B/L- no accessory muscle use.  GI: BS +, non-tender, non-distended- abdominal breathing.    Musculoskeletal:     Comments: No edema LE's; TTP over R inguinal area c/w R hip pain. Neurological:  Awakens, follows basic commands when awake, but no spontaneous actions.  Moderate to severe dysarthria; some apraxia and poor initiation Follows simple commands with cues Right facial weakness Right upper extremity: 5/5 proximal distal Right lower extremity: 4-4+/5 proximal distal Left upper extremity: 1+-2-/5 proximal distal Left lower extremity: 3-/5 proximal to distal  Skin: Skin is warm and dry. Dry Psychiatric: delayed, cooperative;  very poor initiation  Assessment/Plan: 1. Functional deficits secondary to R MCA infarct with L hemiparesis which require 3+ hours per day of interdisciplinary therapy in a comprehensive inpatient rehab setting.  Physiatrist is providing close team supervision and 24 hour management of active medical problems listed below.  Physiatrist and rehab team continue to assess barriers to discharge/monitor patient progress toward functional and medical goals  Care Tool:  Bathing    Body parts bathed by patient: Face, Abdomen, Chest   Body parts bathed by helper: Right arm, Left arm, Front perineal area, Buttocks, Right upper leg, Left upper leg, Right lower leg, Left lower leg     Bathing assist Assist Level: Total Assistance - Patient < 25%     Upper Body Dressing/Undressing Upper body dressing   What is the patient wearing?: Pull over shirt    Upper body assist Assist Level: Maximal Assistance - Patient 25 - 49%    Lower Body Dressing/Undressing Lower body dressing      What is the patient wearing?: Underwear/pull up, Pants     Lower body assist Assist for lower body dressing: Total Assistance - Patient < 25%     Toileting Toileting    Toileting assist Assist for toileting: Dependent - Patient 0%     Transfers Chair/bed transfer  Transfers assist     Chair/bed transfer assist level: 2 Helpers     Locomotion Ambulation   Ambulation assist   Ambulation activity did not occur: Safety/medical concerns(unsafe due to inability to stand; letheray and  low BP)  Assist level: 2 helpers Assistive device: Other (comment)(hallway rail) Max distance: 23ft   Walk 10 feet activity   Assist  Walk 10 feet activity did not occur: Safety/medical concerns  Assist level: Maximal Assistance - Patient 25 - 49% Assistive device: Walker-rolling   Walk 50 feet activity   Assist Walk 50 feet with 2 turns activity did not occur: Safety/medical concerns         Walk 150  feet activity   Assist Walk 150 feet activity did not occur: Safety/medical concerns         Walk 10 feet on uneven surface  activity   Assist Walk 10 feet on uneven surfaces activity did not occur: Safety/medical concerns         Wheelchair     Assist   Type of Wheelchair: Manual    Wheelchair assist level: Minimal Assistance - Patient > 75% Max wheelchair distance: 56'    Wheelchair 50 feet with 2 turns activity    Assist        Assist Level: Minimal Assistance - Patient > 75%   Wheelchair 150 feet activity     Assist      Assist Level: Dependent - Patient 0%   Blood pressure 111/60, pulse 61, temperature 98.3 F (36.8 C), resp. rate 16, height 5\' 7"  (1.702 m), weight 83.1 kg, SpO2 100 %.  1.  Left-sided weakness with facial droop and dysphagia secondary to large right MCA infarction with hemorrhagic conversion, embolic secondary to known A. fib on Eliquis as well as history of left MCA infarction 2018 status post thrombectomy for left M2 occlusion.  Anticoagulation on hold due to hemorrhage  930- will try Provigil for attention- can't start Ritalin per QT prolongation nor Amantadine for same reason             -Continue CIR therapies including PT, OT, and SLP  2.  Antithrombotics: -DVT/anticoagulation: Subcutaneous heparin initiated 11/11/2018  9/25- changed to Lovenox             Monitor for bleeding             -antiplatelet therapy: N/A 3. Pain Management: Neurontin 300 mg 3 times daily,tramadol as needed  9/25- will add Depakote 125 mg BID for headache since had new stroke.- seems to be helping--observe for neurosedation however 4. Mood: Valium 5 mg twice daily as needed anxiety             -antipsychotic agents: N/A 5. Neuropsych: This patient is not fully capable of making decisions on his own behalf. 6. Skin/Wound Care: Routine skin checks 7. Fluids/Electrolytes/Nutrition: Routine I/Os              . 8.  Atrial fibrillation status post  ICD.  Follow cardiology services.  Patient with recent cardioversion.  Cardiac rate controlled 9.  Acute on chronic diastolic congestive heart failure.  Lasix 40 mg daily.  Monitor for any signs of fluid overload             Daily weights needed   Filed Weights   11/21/18 0500 11/22/18 0459 11/23/18 0412  Weight: 85.5 kg 84.7 kg 83.1 kg    10.  Nonischemic cardiomyopathy.  Ejection fraction 15 to 20%.             See #9 11.  Hypertension.  Coreg 25 mg twice daily, Aldactone 12.5 mg daily, Entresto 24-26 mg twice daily  9/25- will decrease Coreg to 12.5 mg BID since BP is  running so low/100/58 this AM             9/27--bp improved  9/29- BP doing OK [id number] systolic 12.  Hyperlipidemia.  Lipitor 13.  Post stroke dysphagia.  Dysphasia #2 thin liquids.  Follow-up speech therapy 14.  AKI.  Creatinine 1.23-1.54                9/25- Cr 1.46- in baseline  9/28- will recheck in AM  9/29- Cr 1.38- which is baseline.  10/1- Cr up significantly to 1.79 and Bun up to 34- will give IVFs   NS 60cc/hr x 24 hrs and recheck labs in AM  10/2- Cr down to 1.43- baseline- will stop IVFs 15.  History of tobacco/marijuana abuse as well as history of remote cocaine use.  Provide counseling 16.  Medical noncompliance.  Counseling 17.  Chronic hep C.  Follow-up outpatient 18. R trochanteric bursitis/R groin pain/Hip pain-  9/25- ordered lidocaine patch for R lateral hip  -ROM/Ice  10/3- no xray in computer- will check one; also will encourage nursing to use tylenol/tramadol with R hip pain 19. Fever: Tmx 101  -check ua/ucx, cxr  -left eye doesn't appear infected but given temp, will treat for blepharitis for now pending urine and chest  -continue to monitor  9/28- rechecked CXR since so junky- is (-)   9/29- U/A (+) for nitrites- waiting for sensitivities, so can start ABX.; will start warm compressed for L eyelid sty which appeared in last 1-2 days.  9/30- sty appears less distinct/less swollen  10/2-  on Keflex for UTI; will add lidocaine cream to L cheek for pain.   10/4- change to Rocephin last night, from Keflex- will recheck labs today; also ordered for tomorrow- although afebrile, just want to not miss anything.  20 . Poor initiation  9/29- cannot start Amantadine due to severely prolonged QT interval- will have to wait on med changes.  9/30 added Provigil 100 mg daily  10/1- changed to 6am so more awake for day- did help this AM  10/2- still an issue- can't start meds due to QT prolongation.   LOS: 10 days A FACE TO FACE EVALUATION WAS PERFORMED  Angus Amini 11/23/2018, 11:43 AM

## 2018-11-23 NOTE — Plan of Care (Signed)
  Problem: Consults Goal: RH STROKE PATIENT EDUCATION Description: See Patient Education module for education specifics  Outcome: Progressing Goal: Nutrition Consult-if indicated Outcome: Progressing   Problem: RH BOWEL ELIMINATION Goal: RH STG MANAGE BOWEL WITH ASSISTANCE Description: STG Manage Bowel with min Assistance. Outcome: Progressing Goal: RH STG MANAGE BOWEL W/MEDICATION W/ASSISTANCE Description: STG Manage Bowel with Medication with min Assistance. Outcome: Progressing   Problem: RH SAFETY Goal: RH STG ADHERE TO SAFETY PRECAUTIONS W/ASSISTANCE/DEVICE Description: STG Adhere to Safety Precautions With min Assistance/Device. Outcome: Progressing Goal: RH STG DECREASED RISK OF FALL WITH ASSISTANCE Description: STG Decreased Risk of Fall With min Assistance. Outcome: Progressing   Problem: RH COGNITION-NURSING Goal: RH STG USES MEMORY AIDS/STRATEGIES W/ASSIST TO PROBLEM SOLVE Description: STG Uses Memory Aids/Strategies With min Assistance to Problem Solve. Outcome: Progressing   Problem: RH PAIN MANAGEMENT Goal: RH STG PAIN MANAGED AT OR BELOW PT'S PAIN GOAL Description: Less than 3 out of 10 Outcome: Progressing   Problem: RH KNOWLEDGE DEFICIT Goal: RH STG INCREASE KNOWLEDGE OF HYPERTENSION Description: Patient able to state 2 methods in controlling hypertension Outcome: Progressing

## 2018-11-23 NOTE — Progress Notes (Signed)
Pharmacy note: depakote  Patient is on a dysphagia diet & takes his meds crushed. He is currently on depakote DR 250mg  po q12h  Plan -Will change to Depakote sprinkles 250mg  po q12h for ease of administration  Hildred Laser, PharmD Clinical Pharmacist **Pharmacist phone directory can now be found on [email or web address] ([email or web address]).  Listed under [place].

## 2018-11-24 ENCOUNTER — Inpatient Hospital Stay (HOSPITAL_COMMUNITY): Payer: Medicare HMO | Admitting: Speech Pathology

## 2018-11-24 ENCOUNTER — Inpatient Hospital Stay (HOSPITAL_COMMUNITY): Payer: Medicare HMO

## 2018-11-24 ENCOUNTER — Inpatient Hospital Stay (HOSPITAL_COMMUNITY): Payer: Medicare HMO | Admitting: Occupational Therapy

## 2018-11-24 DIAGNOSIS — D62 Acute posthemorrhagic anemia: Secondary | ICD-10-CM

## 2018-11-24 DIAGNOSIS — N183 Chronic kidney disease, stage 3 unspecified: Secondary | ICD-10-CM

## 2018-11-24 DIAGNOSIS — I10 Essential (primary) hypertension: Secondary | ICD-10-CM

## 2018-11-24 DIAGNOSIS — R7309 Other abnormal glucose: Secondary | ICD-10-CM

## 2018-11-24 DIAGNOSIS — N179 Acute kidney failure, unspecified: Secondary | ICD-10-CM

## 2018-11-24 LAB — COMPREHENSIVE METABOLIC PANEL
ALT: 16 U/L (ref 0–44)
AST: 25 U/L (ref 15–41)
Albumin: 2.8 g/dL — ABNORMAL LOW (ref 3.5–5.0)
Alkaline Phosphatase: 56 U/L (ref 38–126)
Anion gap: 12 (ref 5–15)
BUN: 17 mg/dL (ref 8–23)
CO2: 21 mmol/L — ABNORMAL LOW (ref 22–32)
Calcium: 9.1 mg/dL (ref 8.9–10.3)
Chloride: 111 mmol/L (ref 98–111)
Creatinine, Ser: 1.31 mg/dL — ABNORMAL HIGH (ref 0.61–1.24)
GFR calc Af Amer: >60 mL/min (ref >60)
GFR calc non Af Amer: 56 mL/min — ABNORMAL LOW (ref >60)
Glucose, Bld: 145 mg/dL — ABNORMAL HIGH (ref 70–99)
Potassium: 4.1 mmol/L (ref 3.5–5.1)
Sodium: 144 mmol/L (ref 135–145)
Total Bilirubin: 1.1 mg/dL (ref 0.3–1.2)
Total Protein: 6.6 g/dL (ref 6.5–8.1)

## 2018-11-24 LAB — CBC WITH DIFFERENTIAL/PLATELET
Abs Immature Granulocytes: 0.05 10*3/uL (ref 0.00–0.07)
Basophils Absolute: 0 10*3/uL (ref 0.0–0.1)
Basophils Relative: 0 %
Eosinophils Absolute: 0.1 10*3/uL (ref 0.0–0.5)
Eosinophils Relative: 2 %
HCT: 37 % — ABNORMAL LOW (ref 39.0–52.0)
Hemoglobin: 12.6 g/dL — ABNORMAL LOW (ref 13.0–17.0)
Immature Granulocytes: 1 %
Lymphocytes Relative: 15 %
Lymphs Abs: 1.1 10*3/uL (ref 0.7–4.0)
MCH: 32.7 pg (ref 26.0–34.0)
MCHC: 34.1 g/dL (ref 30.0–36.0)
MCV: 96.1 fL (ref 80.0–100.0)
Monocytes Absolute: 0.8 10*3/uL (ref 0.1–1.0)
Monocytes Relative: 11 %
Neutro Abs: 5.1 10*3/uL (ref 1.7–7.7)
Neutrophils Relative %: 71 %
Platelets: 171 10*3/uL (ref 150–400)
RBC: 3.85 MIL/uL — ABNORMAL LOW (ref 4.22–5.81)
RDW: 12.1 % (ref 11.5–15.5)
WBC: 7.2 10*3/uL (ref 4.0–10.5)
nRBC: 0 % (ref 0.0–0.2)

## 2018-11-24 NOTE — Progress Notes (Signed)
Speech Language Pathology Daily Session Note  Patient Details  Name: Glenn [name] MRN: [id number] Date of Birth: 23-Jul-1950  Today's Date: 11/24/2018 SLP Individual Time: 1345-1430 SLP Individual Time Calculation (min): 45 min  Short Term Goals: Week 2: SLP Short Term Goal 1 (Week 2): Pt will consume current dysphagia 1 solid, thin liquid diet with min overt s/s aspiration, efficient mastication and oral clearance with Mod A verbal cues for use of swallow stategies. SLP Short Term Goal 2 (Week 2): Pt will maintain alertness with Min A and demonstrate efficient mastication and oral clearance with upgraded dysphagia 2 solid trials prior to solid advancement. SLP Short Term Goal 3 (Week 2): Pt will demonstate basic problem solving during familiar functional tasks with Mod A verbal/visual cues. SLP Short Term Goal 4 (Week 2): Pt will sustain attention for ~5 minutes with Mod A cues. SLP Short Term Goal 5 (Week 2): Pt will recall daily information with Min A for use of compensatory memory strategies. SLP Short Term Goal 6 (Week 2): Pt will increase speech intelligibility to ~75% at the phrase level and Mod A for use of clear speech strategies.  Skilled Therapeutic Interventions: Pt was seen for skilled ST targeting communication goals. Pt was more alert and engaged this afternoon than previous session earlier this morning. However, he still required Mod A verbal cues for redirection to task and repetitions of instructions throughout session. SLP facilitated session with overall Mod A verbal cues to utilize increased vocal intensity, slow rate, and overarticulation during a barrier game targeting speech intelligibility. With intentional use of aforementioned strategies, pt achieved ~80% intelligibility at the phrase and short sentence level. Both level of cueing required and percentage of intelligibility was improved today in comparison to previously targeted sessions. Increased vocal intensity  alone significantly improves pt's intelligibility, although he is still encouraged to implement all 3 for optimal communication. Pt was left in bed with alarm set and call bell within reach. Continue per current plan of care.      Pain Pain Assessment Pain Scale: Faces Faces Pain Scale: Hurts a little bit Pain Type: Acute pain Pain Location: Leg Pain Orientation: Right Pain Descriptors / Indicators: Aching Pain Onset: On-going Pain Intervention(s): Medication (See eMAR) Multiple Pain Sites: No  Therapy/Group: Individual Therapy  Arbutus Leas 11/24/2018, 2:46 PM

## 2018-11-24 NOTE — Progress Notes (Signed)
Speech Language Pathology Daily Session Note  Patient Details  Name: Glenn [name] MRN: [id number] Date of Birth: [birth year]  Today's Date: 11/24/2018 SLP Individual Time: 1000-1059 SLP Individual Time Calculation (min): 59 min  Short Term Goals: Week 2: SLP Short Term Goal 1 (Week 2): Pt will consume current dysphagia 1 solid, thin liquid diet with min overt s/s aspiration, efficient mastication and oral clearance with Mod A verbal cues for use of swallow stategies. SLP Short Term Goal 2 (Week 2): Pt will maintain alertness with Min A and demonstrate efficient mastication and oral clearance with upgraded dysphagia 2 solid trials prior to solid advancement. SLP Short Term Goal 3 (Week 2): Pt will demonstate basic problem solving during familiar functional tasks with Mod A verbal/visual cues. SLP Short Term Goal 4 (Week 2): Pt will sustain attention for ~5 minutes with Mod A cues. SLP Short Term Goal 5 (Week 2): Pt will recall daily information with Min A for use of compensatory memory strategies. SLP Short Term Goal 6 (Week 2): Pt will increase speech intelligibility to ~75% at the phrase level and Mod A for use of clear speech strategies.  Skilled Therapeutic Interventions: Pt was seen for skilled ST targeting dysphagia and cognition. Pt continues to be extremely limited by fatigue and internal/external distractions. He continues to report pain, however orientation of pain is not entirely clear based on pt's descriptions and can change throughout session. He requires Max A multimodal cues to initiate and participate in basic activities. Pt demonstrated improved verbal recall of swallow strategies and overall efficiency of mastication during upgraded dysphagia 2 trials today. However, he became easily fatigued during intake, so level of SLP's cueing quickly had to increase from [place] to Max for pt to masticate (poor bolus awareness) and clear left pocketed POs efficiently via lingual sweep and  liquid wash. SLP further facilitated session with a basic card sorting task. Mod increasing to Max A verbal cues were required for error awareness and correction when matching cards based on shape (field of 6 shapes). Pt sustained attention for a maximum of 2 minute intervals during session today. Pt was left in bed with alarm set and all needs within reach. Continue per current plan of care.      Pain Pain Assessment Pain Scale: Faces Faces Pain Scale: Hurts a little bit Pain Type: Acute pain Pain Location: Generalized Pain Orientation: Right Pain Descriptors / Indicators: Discomfort Pain Onset: On-going Pain Intervention(s): RN made aware;Distraction;Emotional support Multiple Pain Sites: No  Therapy/Group: Individual Therapy  Arbutus Leas 11/24/2018, 10:52 AM

## 2018-11-24 NOTE — Progress Notes (Signed)
[place] PHYSICAL MEDICINE & REHABILITATION PROGRESS NOTE   Subjective/Complaints: Patient seen laying in bed this morning.  No reported issues overnight.  ROS: Limited due to cognition   Objective:   No results found. Recent Labs    11/23/18 2005 11/24/18 0802  WBC 6.8 7.2  HGB 13.4 12.6*  HCT 41.1 37.0*  PLT 198 171   Recent Labs    11/23/18 2005 11/24/18 0802  NA 139 144  K 4.6 4.1  CL 105 111  CO2 24 21*  GLUCOSE 113* 145*  BUN 21 17  CREATININE 1.22 1.31*  CALCIUM 9.0 9.1    Intake/Output Summary (Last 24 hours) at 11/24/2018 1112 Last data filed at 11/24/2018 0955 Gross per 24 hour  Intake 1803.5 ml  Output 250 ml  Net 1553.5 ml     Physical Exam: Vital Signs Blood pressure 120/77, pulse 81, temperature 99.2 F (37.3 C), temperature source Oral, resp. rate 18, height 5\' 7"  (1.702 m), weight 84.1 kg, SpO2 96 %. Constitutional: No distress . Vital signs reviewed. HENT: Normocephalic.  Atraumatic. Eyes: EOMI. No discharge.  Left eye stye. Cardiovascular: No JVD. Respiratory: Normal effort.  No stridor. GI: Non-distended. Skin: Warm and dry.  Intact. Psych: Normal mood.  Normal behavior. Musc: No edema in extremities.  No tenderness in extremities. Neurological:  Alert Left facial weakness Follows simple commands with cues Right upper extremity: 5/5 proximal distal Right lower extremity: 4-4+/5 proximal distal Left upper extremity: 0/5 proximal to distal Left lower extremity: 2/5 proximal to distal   Assessment/Plan: 1. Functional deficits secondary to R MCA infarct with L hemiparesis which require 3+ hours per day of interdisciplinary therapy in a comprehensive inpatient rehab setting.  Physiatrist is providing close team supervision and 24 hour management of active medical problems listed below.  Physiatrist and rehab team continue to assess barriers to discharge/monitor patient progress toward functional and medical goals  Care  Tool:  Bathing    Body parts bathed by patient: Face, Abdomen, Chest   Body parts bathed by helper: Right arm, Left arm, Front perineal area, Buttocks, Right upper leg, Left upper leg, Right lower leg, Left lower leg     Bathing assist Assist Level: Total Assistance - Patient < 25%     Upper Body Dressing/Undressing Upper body dressing   What is the patient wearing?: Pull over shirt    Upper body assist Assist Level: Maximal Assistance - Patient 25 - 49%    Lower Body Dressing/Undressing Lower body dressing      What is the patient wearing?: Underwear/pull up, Pants     Lower body assist Assist for lower body dressing: Total Assistance - Patient < 25%     Toileting Toileting    Toileting assist Assist for toileting: Dependent - Patient 0%     Transfers Chair/bed transfer  Transfers assist     Chair/bed transfer assist level: 2 Helpers     Locomotion Ambulation   Ambulation assist   Ambulation activity did not occur: Safety/medical concerns(unsafe due to inability to stand; letheray and low BP)  Assist level: 2 helpers Assistive device: Other (comment)(hallway rail) Max distance: 103ft   Walk 10 feet activity   Assist  Walk 10 feet activity did not occur: Safety/medical concerns  Assist level: Maximal Assistance - Patient 25 - 49% Assistive device: Walker-rolling   Walk 50 feet activity   Assist Walk 50 feet with 2 turns activity did not occur: Safety/medical concerns         Walk  150 feet activity   Assist Walk 150 feet activity did not occur: Safety/medical concerns         Walk 10 feet on uneven surface  activity   Assist Walk 10 feet on uneven surfaces activity did not occur: Safety/medical concerns         Wheelchair     Assist   Type of Wheelchair: Manual    Wheelchair assist level: Minimal Assistance - Patient > 75% Max wheelchair distance: 62'    Wheelchair 50 feet with 2 turns activity    Assist         Assist Level: Minimal Assistance - Patient > 75%   Wheelchair 150 feet activity     Assist      Assist Level: Dependent - Patient 0%   Blood pressure 120/77, pulse 81, temperature 99.2 F (37.3 C), temperature source Oral, resp. rate 18, height 5\' 7"  (1.702 m), weight 84.1 kg, SpO2 96 %.  1.  Left-sided weakness with facial droop and dysphagia secondary to large right MCA infarction with hemorrhagic conversion, embolic secondary to known A. fib on Eliquis as well as history of left MCA infarction 2018 status post thrombectomy for left M2 occlusion.  Anticoagulation on hold due to hemorrhage  Cont CIR  930- will try Provigil for attention- can't start Ritalin per QT prolongation nor Amantadine for same reason 2.  Antithrombotics: -DVT/anticoagulation: Subcutaneous heparin initiated 11/11/2018  9/25- changed to Lovenox             Monitor for bleeding             -antiplatelet therapy: N/A 3. Pain Management: Neurontin 300 mg 3 times daily,tramadol as needed  9/25- Added Depakote 125 mg BID for headache since had new stroke.- seems to be helping--observe for neurosedation however 4. Mood: Valium 5 mg twice daily as needed anxiety             -antipsychotic agents: N/A 5. Neuropsych: This patient is not fully capable of making decisions on his own behalf. 6. Skin/Wound Care: Routine skin checks 7. Fluids/Electrolytes/Nutrition: Routine I/Os 8.  Atrial fibrillation status post ICD.  Follow cardiology services.  Patient with recent cardioversion.  Cardiac rate controlled 9.  Acute on chronic diastolic congestive heart failure.  Lasix 40 mg daily.  Monitor for any signs of fluid overload             Daily weights needed   Filed Weights   11/22/18 0459 11/23/18 0412 11/24/18 0500  Weight: 84.7 kg 83.1 kg 84.1 kg    Stable on 10/5 10.  Nonischemic cardiomyopathy.  Ejection fraction 15 to 20%.             See #9 11.  Hypertension.  Cont Coreg 12.5 mg twice daily, Aldactone 12.5 mg  daily, Entresto 24-26 mg twice daily  Controlled on 10/5 12.  Hyperlipidemia.  Lipitor 13.  Post stroke dysphagia.  Dysphasia #1 thin liquids.  Follow-up speech therapy 14.  AKI.  Creatinine 1.23-1.54             Creatinine 1.31 on 10/5, continue to monitor 15.  History of tobacco/marijuana abuse as well as history of remote cocaine use.  Provide counseling 16.  Medical noncompliance.  Counseling 17.  Chronic hep C.  Follow-up outpatient 18. R trochanteric bursitis/R groin pain/Hip pain-  9/25- ordered lidocaine patch for R lateral hip  -ROM/Ice 19. Fever:?  Improving, but has received Tylenol  -left eye doesn't appear infected but given temp, treating  for blepharitis for now pending urine and chest  -continue to monitor  Continue Rocephin for Klebsiella UTI.  20 . Poor initiation  9/29- cannot start Amantadine due to severely prolonged QT interval- will have to wait on med changes.  9/30 added Provigil 100 mg daily  10/1- changed to 6am so more awake for day- did help this AM  10/2- still an issue- can't start meds due to QT prolongation. 21.  Labile blood glucose  Continue to monitor 22.  Acute blood loss anemia  Hemoglobin 12.6 on 10/5  Continue to monitor  LOS: 11 days A FACE TO FACE EVALUATION WAS PERFORMED  Brandon Wiechman Lorie Phenix 11/24/2018, 11:12 AM

## 2018-11-24 NOTE — Progress Notes (Signed)
Occupational Therapy Session Note  Patient Details  Name: Glenn [name] MRN: [id number] Date of Birth: [birth year]  Today's Date: 11/24/2018 OT Individual Time: [id number] OT Individual Time Calculation (min): 60 min    Short Term Goals: Week 2:  OT Short Term Goal 1 (Week 2): Pt will don shirt with mod A OT Short Term Goal 2 (Week 2): Pt will don pants with max A sit to stand with one caregiver OT Short Term Goal 3 (Week 2): Pt will demonstrate sustained attention to ADL task with min cues. OT Short Term Goal 4 (Week 2): Pt will complete toileting tasks with max A  Skilled Therapeutic Interventions/Progress Updates:    Patient supine in bed, alert with gentle stim and conversation.   He is pleasant and cooperative, cues to keep eyes open and attend to the left.  PROM/inhibition for left UE in supine position - able to achieve full ROM t/o with inhibitory techniques (significant flexor tightness at start of session).  Rolling left with min A, max A to roll right.  He is dependent to change saturated brief and complete hygiene.  Side lying to SSP at edge of bed with max A.  Pt c/o tightness at upper rib cage on right side - also note lump on right scapula (that does not appear to cause pain)  Completed trunk mobility activities, gentle stretching, light massage with some relief - lateral leans in both directions.  Scooting to left with max A.  AAROM proximal left UE,  He tolerated unsupported sitting with min A to maintain balance for 30 minutes.  Max a to complete SSP to supine and positioning in bed.  Bed alarm set and call bell in reach at close of session.  He reports that his back feels better but is still tight.    Therapy Documentation Precautions:  Precautions Precautions: Fall, ICD/Pacemaker Precaution Comments: L hemi; L inattention Restrictions Weight Bearing Restrictions: No General:   Vital Signs: Therapy Vitals Temp: 98.4 F (36.9 C) Temp Source: Oral Pulse Rate:  73 Resp: 17 BP: 122/66 Patient Position (if appropriate): Sitting Oxygen Therapy SpO2: 97 % O2 Device: Room Air Pain: Pain Assessment Pain Scale: 0-10 Pain Score: 3  Faces Pain Scale: Hurts a little bit Pain Type: Acute pain Pain Location: Back Pain Orientation: Right Pain Descriptors / Indicators: Tightness Pain Onset: On-going Pain Intervention(s): Repositioned Multiple Pain Sites: No   Therapy/Group: Individual Therapy  Carlos Levering 11/24/2018, 4:36 PM

## 2018-11-24 NOTE — Progress Notes (Signed)
Patient has a low grade temperature this morning. Not high enough for order for tylenol. No c/o pain or discomfort. Patient was noted to be wet this morning when staff went to perform a bladder scan.Bladder scan volume was low, but he needed a complete bed linen change. He is alert & responsive & was noted to be the same last night. Pulse is irregular. No acute distress noted. Will continue to monitor & inform oncoming nurse & physician this morning.

## 2018-11-24 NOTE — Plan of Care (Signed)
  Problem: Consults Goal: RH STROKE PATIENT EDUCATION Description: See Patient Education module for education specifics  Outcome: Progressing Goal: Nutrition Consult-if indicated Outcome: Progressing Goal: Diabetes Guidelines if Diabetic/Glucose > 140 Description: If diabetic or lab glucose is > 140 mg/dl - Initiate Diabetes/Hyperglycemia Guidelines & Document Interventions  Outcome: Progressing   Problem: RH BOWEL ELIMINATION Goal: RH STG MANAGE BOWEL WITH ASSISTANCE Description: STG Manage Bowel with min Assistance. Outcome: Progressing Goal: RH STG MANAGE BOWEL W/MEDICATION W/ASSISTANCE Description: STG Manage Bowel with Medication with min Assistance. Outcome: Progressing   Problem: RH BLADDER ELIMINATION Goal: RH STG MANAGE BLADDER WITH ASSISTANCE Description: STG Manage Bladder With min Assistance Outcome: Progressing Goal: RH STG MANAGE BLADDER WITH EQUIPMENT WITH ASSISTANCE Description: STG Manage Bladder With Equipment With min Assistance Outcome: Progressing   Problem: RH SKIN INTEGRITY Goal: RH STG SKIN FREE OF INFECTION/BREAKDOWN Outcome: Progressing Goal: RH STG MAINTAIN SKIN INTEGRITY WITH ASSISTANCE Description: STG Maintain Skin Integrity With mod I Assistance. Outcome: Progressing   Problem: RH SAFETY Goal: RH STG ADHERE TO SAFETY PRECAUTIONS W/ASSISTANCE/DEVICE Description: STG Adhere to Safety Precautions With min Assistance/Device. Outcome: Progressing Goal: RH STG DECREASED RISK OF FALL WITH ASSISTANCE Description: STG Decreased Risk of Fall With min Assistance. Outcome: Progressing   Problem: RH COGNITION-NURSING Goal: RH STG USES MEMORY AIDS/STRATEGIES W/ASSIST TO PROBLEM SOLVE Description: STG Uses Memory Aids/Strategies With min Assistance to Problem Solve. Outcome: Progressing Goal: RH STG ANTICIPATES NEEDS/CALLS FOR ASSIST W/ASSIST/CUES Description: STG Anticipates Needs/Calls for Assist With min Assistance/Cues. Outcome: Progressing    Problem: RH PAIN MANAGEMENT Goal: RH STG PAIN MANAGED AT OR BELOW PT'S PAIN GOAL Description: Less than 3 out of 10 Outcome: Progressing   Problem: RH KNOWLEDGE DEFICIT Goal: RH STG INCREASE KNOWLEDGE OF HYPERTENSION Description: Patient able to state 2 methods in controlling hypertension Outcome: Progressing Goal: RH STG INCREASE KNOWLEDGE OF DYSPHAGIA/FLUID INTAKE Description: Patient able to demonstrate appropriate swallowing precautions Outcome: Progressing Goal: RH STG INCREASE KNOWLEGDE OF HYPERLIPIDEMIA Description: Patient able to identify medication for hyperlipidemia.  Outcome: Progressing Goal: RH STG INCREASE KNOWLEDGE OF STROKE PROPHYLAXIS Description: Patient able to state 2 medications he is on for stroke prevention Outcome: Progressing

## 2018-11-24 NOTE — Progress Notes (Signed)
Physical Therapy Session Note  Patient Details  Name: Glenn [name] MRN: [id number] Date of Birth: 02/24/1950  Today's Date: 11/24/2018 PT Individual Time: 1130-1200 PT Individual Time Calculation (min): 30 min   Short Term Goals: Week 2:  PT Short Term Goal 1 (Week 2): Pt will be able to perform bed mobility with mod assist consistently PT Short Term Goal 2 (Week 2): Pt will be able to perform basic transfers with mod assist consistently PT Short Term Goal 3 (Week 2): Pt will ambulate >10 feet usinf LRAD with max A PT Short Term Goal 4 (Week 2): Pt will be able to propel w/c with hemi technique 35' with supervision  Skilled Therapeutic Interventions/Progress Updates:     Patient in bed asleep upon PT arrival. Patient required verbal and tactile stimulation to arouse and remained lethargic throughout session. Patient agreeable to earlier PT session to accomodates a change in therapist's schedule. Patient reported mild-moderate R back/rib pain during session, RN made aware. PT provided repositioning, rest breaks, and distraction as pain interventions throughout session.   Therapeutic Activity: Bed Mobility: Patient performed supine to/from sit with max-total A for trunk and LE managment. Provided verbal cues for brining LEs off the bed then pushing to sit up and lowering down to his elbow then bring his LEs up to the bed when lying. Transfers: Patient performed sit to/from stand x2 with max A with the bed slightly elevated and patient was unable to follow cues to pivot to the w/c with therapist and required max A to sit back down on the bed. Attempted a squat pivot transfer and patient was unable to follow cues for transfer and did not participate in attempting transfer with PT. Finally attempted standing with the [facility] and patient became very lethargic and would no longer participate in standing. Provided cues for leaning forward to stand, pushing up with his R hand, and pushing through B  LEs with PT blocking R knee to prevent buckling. Patient verbalized R back pain and feeling achy during activity, RN made aware. Patient's temperature was 98.8 both sublingual and axillary at end of session.   Patient in bed at end of session with breaks locked, bed alarm set, and all needs within reach. Patient was able to drink a few sips of water at end of session with max cues for swallowing and performing chin tuck.    Therapy Documentation Precautions:  Precautions Precautions: Fall, ICD/Pacemaker Precaution Comments: L hemi; L inattention Restrictions Weight Bearing Restrictions: No    Therapy/Group: Individual Therapy  Damaya Channing L Nike Southers PT, DPT  11/24/2018, 12:51 PM

## 2018-11-25 ENCOUNTER — Inpatient Hospital Stay (HOSPITAL_COMMUNITY): Payer: Medicare HMO | Admitting: Speech Pathology

## 2018-11-25 ENCOUNTER — Inpatient Hospital Stay (HOSPITAL_COMMUNITY): Payer: Medicare HMO

## 2018-11-25 LAB — COMPREHENSIVE METABOLIC PANEL
ALT: 19 U/L (ref 0–44)
AST: 29 U/L (ref 15–41)
Albumin: 3 g/dL — ABNORMAL LOW (ref 3.5–5.0)
Alkaline Phosphatase: 65 U/L (ref 38–126)
Anion gap: 13 (ref 5–15)
BUN: 17 mg/dL (ref 8–23)
CO2: 24 mmol/L (ref 22–32)
Calcium: 9.1 mg/dL (ref 8.9–10.3)
Chloride: 105 mmol/L (ref 98–111)
Creatinine, Ser: 1.3 mg/dL — ABNORMAL HIGH (ref 0.61–1.24)
GFR calc Af Amer: >60 mL/min (ref >60)
GFR calc non Af Amer: 57 mL/min — ABNORMAL LOW (ref >60)
Glucose, Bld: 107 mg/dL — ABNORMAL HIGH (ref 70–99)
Potassium: 4 mmol/L (ref 3.5–5.1)
Sodium: 142 mmol/L (ref 135–145)
Total Bilirubin: 0.7 mg/dL (ref 0.3–1.2)
Total Protein: 7.4 g/dL (ref 6.5–8.1)

## 2018-11-25 LAB — CBC WITH DIFFERENTIAL/PLATELET
Abs Immature Granulocytes: 0.04 10*3/uL (ref 0.00–0.07)
Basophils Absolute: 0 10*3/uL (ref 0.0–0.1)
Basophils Relative: 0 %
Eosinophils Absolute: 0.2 10*3/uL (ref 0.0–0.5)
Eosinophils Relative: 2 %
HCT: 40.4 % (ref 39.0–52.0)
Hemoglobin: 13.3 g/dL (ref 13.0–17.0)
Immature Granulocytes: 1 %
Lymphocytes Relative: 17 %
Lymphs Abs: 1.4 10*3/uL (ref 0.7–4.0)
MCH: 31.9 pg (ref 26.0–34.0)
MCHC: 32.9 g/dL (ref 30.0–36.0)
MCV: 96.9 fL (ref 80.0–100.0)
Monocytes Absolute: 1 10*3/uL (ref 0.1–1.0)
Monocytes Relative: 13 %
Neutro Abs: 5.4 10*3/uL (ref 1.7–7.7)
Neutrophils Relative %: 67 %
Platelets: 180 10*3/uL (ref 150–400)
RBC: 4.17 MIL/uL — ABNORMAL LOW (ref 4.22–5.81)
RDW: 12.1 % (ref 11.5–15.5)
WBC: 8 10*3/uL (ref 4.0–10.5)
nRBC: 0 % (ref 0.0–0.2)

## 2018-11-25 MED ORDER — SODIUM CHLORIDE 0.9 % NICU IV INFUSION SIMPLE
INJECTION | INTRAVENOUS | Status: AC
  Administered 2018-11-25 – 2018-11-26 (×2): 60 mL/h via INTRAVENOUS
  Filled 2018-11-25 (×4): qty 500

## 2018-11-25 NOTE — Progress Notes (Signed)
Physical Therapy Session Note  Patient Details  Name: Glenn [name] MRN: [id number] Date of Birth: 1951-01-26  Today's Date: 11/25/2018 PT Individual Time: 1300-1415 PT Individual Time Calculation (min): 75 min   Short Term Goals: Week 2:  PT Short Term Goal 1 (Week 2): Pt will be able to perform bed mobility with mod assist consistently PT Short Term Goal 2 (Week 2): Pt will be able to perform basic transfers with mod assist consistently PT Short Term Goal 3 (Week 2): Pt will ambulate >10 feet usinf LRAD with max A PT Short Term Goal 4 (Week 2): Pt will be able to propel w/c with hemi technique 15' with supervision  Skilled Therapeutic Interventions/Progress Updates:     Patient in bed asleep upon PT arrival. Patient easily aroused to verbal stimulation and agreeable to PT session. Patient reported moderate L medial knee pain, noted mild increased edema, during session, RN made aware. PT provided repositioning, rest breaks, and distraction as pain interventions throughout session. Inspected patient's back per discussed with Stacy, OT yesterday, patient with a large mass on his R upper back just medial to the scapula, soft and tender to touch with clear edges, no discoloration or change in skin temperature noted, RN made aware. Vitals: BP 123/51, HR 104, SPO2 975 on RA, Temp 98.6.  Therapeutic Activity: Bed Mobility: Patient performed supine to sit with mod A with HOB slightly elevated. Provided verbal cues for sliding LEs off the bed and pushing through his UEs to sit up. Transfers: Patient performed squat pivot transfer x3 with max-total A x1 (patient fatigued and lethargic at beginning of session) and mod A x2. Provided cues for attending to task, foot placement, leaning forward and pushing through his LEs and R UE during transfer.   Wheelchair Mobility:  Patient propelled wheelchair 30 feet with min A for steering due to veering L. Provided verbal cues for use of R UE and LE to  propel and steer and reaching out to the R with his foot to assist with steering straight ahead.   Neuromuscular Re-ed: He performed sitting balance 3x5 min working on midline in sitting with a mirror in front of him for visual feedback and manual facilitation for trunk elongation on the L and correction of R trunk lean. Patient progressed from mod A in sitting to close supervision throughout.  Patient performed sit to/from stand with standing balance with focus on LE strengthening with a functional activity x4 with mod A +2 and manual facilitation for L knee extension and hip extension in standing and for correcting L trunk lean with multi-modal cues with a mirror in front for visual feedback. He then performed sit to/from stand x2 using the RW with L hand splint with mod-min A of 1 person with improved posture and alignment in standing in front of a mirror.  Patient in w/c at end of session with breaks locked, seat belt alarm set, and all needs within reach. Provided patient several sips of ice tea and water at end of session. Required mod-max cues to take small sips and swallow.    Therapy Documentation Precautions:  Precautions Precautions: Fall, ICD/Pacemaker Precaution Comments: L hemi; L inattention Restrictions Weight Bearing Restrictions: No    Therapy/Group: Individual Therapy  Carly Applegate L Brandace Cargle PT, DPT  11/25/2018, 4:18 PM

## 2018-11-25 NOTE — Progress Notes (Signed)
[facility] PHYSICAL MEDICINE & REHABILITATION PROGRESS NOTE   Subjective/Complaints:  Pt reports- dreamed his was flying last night- nurse said "was hallucinating" that was flying- pt was CLEAR he was dreaming.  Also, spiked temp to 100.6 last night- got tylenol-no more temps today. Per Nursing, pt has been too sedated to do therapy last few days, however has been extremely sedated since got here due to stroke- trying to increase wakefulness and attention with meds- but complicated by prolonged QT interval- will increase Provigil.   ROS: Limited due to cognition   Objective:   Dg Chest 2 View  Result Date: 11/25/2018 CLINICAL DATA:  Intermittent fever. EXAM: CHEST - 2 VIEW COMPARISON:  11/17/2018 FINDINGS: Lordotic technique demonstrated. Left-sided pacemaker unchanged. Lungs are adequately inflated and otherwise clear. Stable cardiomegaly. Remainder of the exam is unchanged. IMPRESSION: No acute cardiopulmonary disease. Stable cardiomegaly. Electronically Signed   By: Marin Olp M.D.   On: 11/25/2018 11:17   Recent Labs    11/24/18 0802 11/25/18 1006  WBC 7.2 8.0  HGB 12.6* 13.3  HCT 37.0* 40.4  PLT 171 180   Recent Labs    11/24/18 0802 11/25/18 1006  NA 144 142  K 4.1 4.0  CL 111 105  CO2 21* 24  GLUCOSE 145* 107*  BUN 17 17  CREATININE 1.31* 1.30*  CALCIUM 9.1 9.1    Intake/Output Summary (Last 24 hours) at 11/25/2018 1352 Last data filed at 11/25/2018 1335 Gross per 24 hour  Intake 470 ml  Output 250 ml  Net 220 ml     Physical Exam: Vital Signs Blood pressure 137/71, pulse 80, temperature 99.6 F (37.6 C), temperature source Oral, resp. rate 18, height 5\' 7"  (1.702 m), weight 83.1 kg, SpO2 97 %. Constitutional: No distress . Vital signs and labs and nursing notes reviewed. Pt awake to speak with me; actually more clear than has been normally; doesn't appear to be hallucinating currently (per nurses has been hallucinating)- pt said is having  dreams/daydreams as well about flying, being in shop, etc, able to tell me what was on tv, NAD HENT: Normocephalic.  Atraumatic. Eyes: EOMI. No discharge.  Left eye stye. Cardiovascular: regular rate; intermittently irregular Respiratory: Normal effort.  CTA B/L- good air movement GI: Non-distended. NT, (+)BS, soft Skin: Warm and dry.  Intact. Psych: more awake than normal. Musc: No edema in extremities.  No tenderness in extremities. Neurological:  Alert Left facial weakness Follows simple commands with cues Right upper extremity: 5/5 proximal distal Right lower extremity: 4-4+/5 proximal distal Left upper extremity: 0/5 proximal to distal Left lower extremity: 2/5 proximal to distal   Assessment/Plan: 1. Functional deficits secondary to R MCA infarct with L hemiparesis which require 3+ hours per day of interdisciplinary therapy in a comprehensive inpatient rehab setting.  Physiatrist is providing close team supervision and 24 hour management of active medical problems listed below.  Physiatrist and rehab team continue to assess barriers to discharge/monitor patient progress toward functional and medical goals  Care Tool:  Bathing  Bathing activity did not occur: Refused Body parts bathed by patient: Face, Abdomen, Chest   Body parts bathed by helper: Right arm, Left arm, Front perineal area, Buttocks, Right upper leg, Left upper leg, Right lower leg, Left lower leg     Bathing assist Assist Level: Total Assistance - Patient < 25%     Upper Body Dressing/Undressing Upper body dressing   What is the patient wearing?: Pull over shirt    Upper body assist  Assist Level: Total Assistance - Patient < 25%    Lower Body Dressing/Undressing Lower body dressing      What is the patient wearing?: Incontinence brief, Pants     Lower body assist Assist for lower body dressing: Total Assistance - Patient < 25%     Toileting Toileting    Toileting assist Assist for  toileting: Total Assistance - Patient < 25%     Transfers Chair/bed transfer  Transfers assist     Chair/bed transfer assist level: 2 Helpers     Locomotion Ambulation   Ambulation assist   Ambulation activity did not occur: Safety/medical concerns(unsafe due to inability to stand; letheray and low BP)  Assist level: 2 helpers Assistive device: Other (comment)(hallway rail) Max distance: 72ft   Walk 10 feet activity   Assist  Walk 10 feet activity did not occur: Safety/medical concerns  Assist level: Maximal Assistance - Patient 25 - 49% Assistive device: Walker-rolling   Walk 50 feet activity   Assist Walk 50 feet with 2 turns activity did not occur: Safety/medical concerns         Walk 150 feet activity   Assist Walk 150 feet activity did not occur: Safety/medical concerns         Walk 10 feet on uneven surface  activity   Assist Walk 10 feet on uneven surfaces activity did not occur: Safety/medical concerns         Wheelchair     Assist   Type of Wheelchair: Manual    Wheelchair assist level: Minimal Assistance - Patient > 75% Max wheelchair distance: 40'    Wheelchair 50 feet with 2 turns activity    Assist        Assist Level: Minimal Assistance - Patient > 75%   Wheelchair 150 feet activity     Assist      Assist Level: Dependent - Patient 0%   Blood pressure 137/71, pulse 80, temperature 99.6 F (37.6 C), temperature source Oral, resp. rate 18, height 5\' 7"  (1.702 m), weight 83.1 kg, SpO2 97 %.  1.  Left-sided weakness with facial droop and dysphagia secondary to large right MCA infarction with hemorrhagic conversion, embolic secondary to known A. fib on Eliquis as well as history of left MCA infarction 2018 status post thrombectomy for left M2 occlusion.  Anticoagulation on hold due to hemorrhage  Cont CIR  930- will try Provigil for attention- can't start Ritalin per QT prolongation nor Amantadine for same  reason  10/6- will increase Provigil for AM-for attention 2.  Antithrombotics: -DVT/anticoagulation: Subcutaneous heparin initiated 11/11/2018  9/25- changed to Lovenox             Monitor for bleeding             -antiplatelet therapy: N/A 3. Pain Management: Neurontin 300 mg 3 times daily,tramadol as needed  9/25- Added Depakote 125 mg BID for headache since had new stroke.- seems to be helping--observe for neurosedation however 4. Mood: Valium 5 mg twice daily as needed anxiety             -antipsychotic agents: N/A 5. Neuropsych: This patient is not fully capable of making decisions on his own behalf. 6. Skin/Wound Care: Routine skin checks 7. Fluids/Electrolytes/Nutrition: Routine I/Os 8.  Atrial fibrillation status post ICD.  Follow cardiology services.  Patient with recent cardioversion.  Cardiac rate controlled 9.  Acute on chronic diastolic congestive heart failure.  Lasix 40 mg daily.  Monitor for any signs of fluid overload  Daily weights needed   Filed Weights   11/23/18 0412 11/24/18 0500 11/25/18 0436  Weight: 83.1 kg 84.1 kg 83.1 kg    Stable on 10/5 10.  Nonischemic cardiomyopathy.  Ejection fraction 15 to 20%.             See #9 11.  Hypertension.  Cont Coreg 12.5 mg twice daily, Aldactone 12.5 mg daily, Entresto 24-26 mg twice daily  Controlled on 10/5 12.  Hyperlipidemia.  Lipitor 13.  Post stroke dysphagia.  Dysphasia #1 thin liquids.  Follow-up speech therapy 14.  AKI.  Creatinine 1.23-1.54             Creatinine 1.31 on 10/5, continue to monitor  10/6- will restart IVFs NS 60cc/hr since per nursing, not drinking well 15.  History of tobacco/marijuana abuse as well as history of remote cocaine use.  Provide counseling 16.  Medical noncompliance.  Counseling 17.  Chronic hep C.  Follow-up outpatient 18. R trochanteric bursitis/R groin pain/Hip pain-  9/25- ordered lidocaine patch for R lateral hip  -ROM/Ice 19. Fever:  Improving, but has received  Tylenol  -left eye doesn't appear infected but given temp, treating for blepharitis for now pending urine and chest  -continue to monitor  Continue Rocephin for Klebsiella UTI  10/6- nursing concerned about infection- on Rocephin til 10/8- NO OTHER SIGNS of infection- WBC nml, no left shift; CXR (-).  20 . Poor initiation  9/29- cannot start Amantadine due to severely prolonged QT interval- will have to wait on med changes.  9/30 added Provigil 100 mg daily  10/1- changed to 6am so more awake for day- did help this AM  10/2- still an issue- can't start meds due to QT prolongation.  10/6- will increase to 200 mg daily. 21.  Labile blood glucose  Continue to monitor 22.  Acute blood loss anemia  Hemoglobin 12.6 on 10/5  Continue to monitor  LOS: 12 days A FACE TO FACE EVALUATION WAS PERFORMED  Rober Skeels 11/25/2018, 1:52 PM

## 2018-11-25 NOTE — Progress Notes (Signed)
Speech Language Pathology Daily Session Note  Patient Details  Name: Glenn [name] MRN: [id number] Date of Birth: [birth year]  Today's Date: 11/25/2018 SLP Individual Time: 1000-1045 SLP Individual Time Calculation (min): 45 min  Short Term Goals: Week 2: SLP Short Term Goal 1 (Week 2): Pt will consume current dysphagia 1 solid, thin liquid diet with min overt s/s aspiration, efficient mastication and oral clearance with Mod A verbal cues for use of swallow stategies. SLP Short Term Goal 2 (Week 2): Pt will maintain alertness with Min A and demonstrate efficient mastication and oral clearance with upgraded dysphagia 2 solid trials prior to solid advancement. SLP Short Term Goal 3 (Week 2): Pt will demonstate basic problem solving during familiar functional tasks with Mod A verbal/visual cues. SLP Short Term Goal 4 (Week 2): Pt will sustain attention for ~5 minutes with Mod A cues. SLP Short Term Goal 5 (Week 2): Pt will recall daily information with Min A for use of compensatory memory strategies. SLP Short Term Goal 6 (Week 2): Pt will increase speech intelligibility to ~75% at the phrase level and Mod A for use of clear speech strategies.  Skilled Therapeutic Interventions: Pt was seen for skilled ST targeting cognitive and dysphagia goals. SLP provided trials of upgraded dysphagia 2 (minced) solids and thin liquids, during which pt required overall Mod A for use of swallow strategies. Min oral residue of solids noted, no overt s/s aspiration observed throughout intake. Pt's mastication and efficiency of strategies for oral clearance was improved today in comparison over previous sessions. Will continue to assess readiness for solid advancement as pt is able to consistently maintain alertness and use of strategies. SLP further facilitated session with basic PEG board design task. Max A verbal cues required for initiation and participation throughout this structured task and Mod A verbal cues  for locating items in left visual field, however he completed task without error. Pt sustained attention for ~30sec-26min intervals prior to requiring cues for redirection. Also of note, pt was with intermittent confusion/irrelevant comments throughout session - (ex: he stated he was going to [place] and asked if he would get the same care there). He demonstrated insight into speech intelligibility deficits, and asked SLP "is my speech really that bad?" SLP reinforced importance of use of clear speech strategies throughout the day for optimal communication. Pt's RN and Rad Tech arrived near end of session to take pt of unit for STAT CXR, which resulted in pt missing 15 minutes of skilled ST. Pt left in bed with RN and Rad tech present. Continue per current plan of care.      Pain Pain Assessment Pain Scale: Faces Faces Pain Scale: No hurt  Therapy/Group: Individual Therapy  Arbutus Leas 11/25/2018, 10:49 AM

## 2018-11-25 NOTE — Progress Notes (Signed)
Occupational Therapy Session Note  Patient Details  Name: Glenn [name] MRN: [id number] Date of Birth: 09/30/1950  Today's Date: 11/25/2018 OT Individual Time: 0700-0800 OT Individual Time Calculation (min): 60 min    Short Term Goals: Week 2:  OT Short Term Goal 1 (Week 2): Pt will don shirt with mod A OT Short Term Goal 2 (Week 2): Pt will don pants with max A sit to stand with one caregiver OT Short Term Goal 3 (Week 2): Pt will demonstrate sustained attention to ADL task with min cues. OT Short Term Goal 4 (Week 2): Pt will complete toileting tasks with max A  Skilled Therapeutic Interventions/Progress Updates:    Pt resting in bed upon arrival and agreeable to therapy. Pt oriented to hospital and reason he is in hospital but continued to talk about being in his "shop" and trying to find his cigarettes. Pt fixated on locating his cigarettes even though he was informed multiple times that smoking is prohibited. OT intervention with focus on bed mobility, functional transfers, sitting balance, dressing, toileting, and attention to his L, and attention to task to increase independence with BADLs.   Supine>sit EOB-max A with max verbal cues for sequencing and attention to task Sitting balance EOB-close supervision Squat pivot transfer>w/c-max A with max verbal cues; pt stopped assisting with transfer halfway through transfer and required tot A to complete Pt requested use of toilet and Stedy used for transfer with max A for sit<>stand in [place]; pt did not come up to complete standing. Pt incontinent of bowel. Toileting tasks-Dependent All dressing tasks with tot A this morning.  Pt continued to fixate on cigarettes and being in his shop throughout session.  Pt required max verbal cues for attention to task  Pt remained seated in w/c with belt alarm activated, half lap tray in place, and all needs within reach.  Therapy Documentation Precautions:  Precautions Precautions: Fall,  ICD/Pacemaker Precaution Comments: L hemi; L inattention Restrictions Weight Bearing Restrictions: No Pain: Pain Assessment Pain Score: 0-No pain  Therapy/Group: Individual Therapy  Leroy Libman 11/25/2018, 8:09 AM

## 2018-11-25 NOTE — Plan of Care (Signed)
  Problem: Consults Goal: RH STROKE PATIENT EDUCATION Description: See Patient Education module for education specifics  Outcome: Progressing Goal: Nutrition Consult-if indicated Outcome: Progressing Goal: Diabetes Guidelines if Diabetic/Glucose > 140 Description: If diabetic or lab glucose is > 140 mg/dl - Initiate Diabetes/Hyperglycemia Guidelines & Document Interventions  Outcome: Progressing   Problem: RH BOWEL ELIMINATION Goal: RH STG MANAGE BOWEL WITH ASSISTANCE Description: STG Manage Bowel with min Assistance. Outcome: Progressing Goal: RH STG MANAGE BOWEL W/MEDICATION W/ASSISTANCE Description: STG Manage Bowel with Medication with min Assistance. Outcome: Progressing   Problem: RH BLADDER ELIMINATION Goal: RH STG MANAGE BLADDER WITH ASSISTANCE Description: STG Manage Bladder With min Assistance Outcome: Progressing Goal: RH STG MANAGE BLADDER WITH EQUIPMENT WITH ASSISTANCE Description: STG Manage Bladder With Equipment With min Assistance Outcome: Progressing   Problem: RH SKIN INTEGRITY Goal: RH STG SKIN FREE OF INFECTION/BREAKDOWN Outcome: Progressing Goal: RH STG MAINTAIN SKIN INTEGRITY WITH ASSISTANCE Description: STG Maintain Skin Integrity With mod I Assistance. Outcome: Progressing   Problem: RH SAFETY Goal: RH STG ADHERE TO SAFETY PRECAUTIONS W/ASSISTANCE/DEVICE Description: STG Adhere to Safety Precautions With min Assistance/Device. Outcome: Progressing Goal: RH STG DECREASED RISK OF FALL WITH ASSISTANCE Description: STG Decreased Risk of Fall With min Assistance. Outcome: Progressing   Problem: RH COGNITION-NURSING Goal: RH STG USES MEMORY AIDS/STRATEGIES W/ASSIST TO PROBLEM SOLVE Description: STG Uses Memory Aids/Strategies With min Assistance to Problem Solve. Outcome: Progressing Goal: RH STG ANTICIPATES NEEDS/CALLS FOR ASSIST W/ASSIST/CUES Description: STG Anticipates Needs/Calls for Assist With min Assistance/Cues. Outcome: Progressing    Problem: RH PAIN MANAGEMENT Goal: RH STG PAIN MANAGED AT OR BELOW PT'S PAIN GOAL Description: Less than 3 out of 10 Outcome: Progressing   Problem: RH KNOWLEDGE DEFICIT Goal: RH STG INCREASE KNOWLEDGE OF HYPERTENSION Description: Patient able to state 2 methods in controlling hypertension Outcome: Progressing Goal: RH STG INCREASE KNOWLEDGE OF DYSPHAGIA/FLUID INTAKE Description: Patient able to demonstrate appropriate swallowing precautions Outcome: Progressing Goal: RH STG INCREASE KNOWLEGDE OF HYPERLIPIDEMIA Description: Patient able to identify medication for hyperlipidemia.  Outcome: Progressing Goal: RH STG INCREASE KNOWLEDGE OF STROKE PROPHYLAXIS Description: Patient able to state 2 medications he is on for stroke prevention Outcome: Progressing

## 2018-11-25 NOTE — Progress Notes (Signed)
Patients PA was informed of his oliguria & temperatures during the shift. No orders received yet. Report given to oncoming nurse.

## 2018-11-25 NOTE — Progress Notes (Signed)
Patient had not urinated during the shift. As per report, his bladder scan volume was low on previous shift at around 3pm. Bladder was scanned & volume was still low, but he was cathed for 214ml of amber urine. He also asked strange questions during the shift. He asked "if the store was still open". Earlier in the shift he was very sleepy when the RN tried to administer his medication. He had to have tactile stimulation continuosly to be able to take his meds. He took them & went back to sleep. Will continue to monitor.

## 2018-11-25 NOTE — Progress Notes (Signed)
Patient has a low grade temp this morning. Tylenol was given. No other symptoms noted. He is more alert this morning, talking & answering questions. No acute distress noted. Communication was put in for the physician. Will alert on coming nurse. Will continue to monitor.

## 2018-11-25 NOTE — Progress Notes (Signed)
Patient was reported to have a temperature of 100.6. Tylenol was given as per orders. He did not have the criteria for the sepsis protocol. Temperature was retaken & it reduced slightly. Third recheck after medication given was WNL. Will continue to monitor. No acute respiratory distress noted at this time

## 2018-11-26 ENCOUNTER — Inpatient Hospital Stay (HOSPITAL_COMMUNITY): Payer: Medicare HMO | Admitting: Speech Pathology

## 2018-11-26 ENCOUNTER — Inpatient Hospital Stay (HOSPITAL_COMMUNITY): Payer: Medicare HMO

## 2018-11-26 ENCOUNTER — Inpatient Hospital Stay (HOSPITAL_COMMUNITY): Payer: Medicare HMO | Admitting: Occupational Therapy

## 2018-11-26 MED ORDER — LIDOCAINE HCL URETHRAL/MUCOSAL 2 % EX GEL: 1.0000 "application " | CUTANEOUS | Status: DC | PRN

## 2018-11-26 NOTE — Progress Notes (Signed)
Social Work Patient ID: HUCKSTON LAINO, male   DOB: March 31, 1950, 68 y.o.   MRN: [id number]  Spoke with jimmy-son via telephone to discuss team conference goals min assist level and new discharge date 10/23. He will bring him some clothes and shoes per therapy request. He is aware of his Dad's need for 24 hr care at discharge and reports he and Debbie-friend will provide this. Not sure this worker is convinced of this. Do not think they understand the care involved but will work on this with them and have encouraged son to come in for therapies.

## 2018-11-26 NOTE — Progress Notes (Signed)
[facility] PHYSICAL MEDICINE & REHABILITATION PROGRESS NOTE   Subjective/Complaints:  Pt reports he thinks he's dreaming but according to nursing AND therapy, pt is insistent that ex- his car is outside, or they took him on flying lessons last night. Insistent on delusions.  Pt denies any issues to me, however nursing wrote order that coude' cath and lidocaine needed for in/out caths which is painful  ROS: Limited due to cognition   Objective:   Dg Chest 2 View  Result Date: 11/25/2018 CLINICAL DATA:  Intermittent fever. EXAM: CHEST - 2 VIEW COMPARISON:  11/17/2018 FINDINGS: Lordotic technique demonstrated. Left-sided pacemaker unchanged. Lungs are adequately inflated and otherwise clear. Stable cardiomegaly. Remainder of the exam is unchanged. IMPRESSION: No acute cardiopulmonary disease. Stable cardiomegaly. Electronically Signed   By: Marin Olp M.D.   On: 11/25/2018 11:17   Recent Labs    11/24/18 0802 11/25/18 1006  WBC 7.2 8.0  HGB 12.6* 13.3  HCT 37.0* 40.4  PLT 171 180   Recent Labs    11/24/18 0802 11/25/18 1006  NA 144 142  K 4.1 4.0  CL 111 105  CO2 21* 24  GLUCOSE 145* 107*  BUN 17 17  CREATININE 1.31* 1.30*  CALCIUM 9.1 9.1    Intake/Output Summary (Last 24 hours) at 11/26/2018 1201 Last data filed at 11/26/2018 0845 Gross per 24 hour  Intake 462 ml  Output 500 ml  Net -38 ml     Physical Exam: Vital Signs Blood pressure 133/82, pulse 77, temperature 98.4 F (36.9 C), temperature source Oral, resp. rate 18, height 5\' 7"  (1.702 m), weight 85.4 kg, SpO2 98 %. Constitutional: No distress . Vital signs and labs and nursing notes reviewed. Pt awake on toilet using sara steady, more alert last 2 days, when sitting up; still poor initiation,, NAD HENT: Normocephalic.  Atraumatic. Eyes: EOMI. No discharge.  Left eye stye. Cardiovascular: regular rate; intermittently irregular Respiratory: Normal effort.  CTA B/L- good air movement GI: Non-distended.  NT, (+)BS, soft Skin: Warm and dry.  Intact. Psych: more awake than normal. Musc: No edema in extremities.  No tenderness in extremities. Neurological:  Alert Left facial weakness Follows simple commands with cues Right upper extremity: 5/5 proximal distal Right lower extremity: 4-4+/5 proximal distal Left upper extremity: 0/5 proximal to distal Left lower extremity: 2/5 proximal to distal   Assessment/Plan: 1. Functional deficits secondary to R MCA infarct with L hemiparesis which require 3+ hours per day of interdisciplinary therapy in a comprehensive inpatient rehab setting.  Physiatrist is providing close team supervision and 24 hour management of active medical problems listed below.  Physiatrist and rehab team continue to assess barriers to discharge/monitor patient progress toward functional and medical goals  Care Tool:  Bathing  Bathing activity did not occur: Refused Body parts bathed by patient: Face, Abdomen, Chest   Body parts bathed by helper: Right arm, Left arm, Front perineal area, Buttocks, Right upper leg, Left upper leg, Right lower leg, Left lower leg     Bathing assist Assist Level: Total Assistance - Patient < 25%     Upper Body Dressing/Undressing Upper body dressing   What is the patient wearing?: Pull over shirt    Upper body assist Assist Level: Total Assistance - Patient < 25%    Lower Body Dressing/Undressing Lower body dressing      What is the patient wearing?: Incontinence brief, Pants     Lower body assist Assist for lower body dressing: Total Assistance - Patient <  25%     Toileting Toileting    Toileting assist Assist for toileting: Total Assistance - Patient < 25%     Transfers Chair/bed transfer  Transfers assist     Chair/bed transfer assist level: Maximal Assistance - Patient 25 - 49%     Locomotion Ambulation   Ambulation assist   Ambulation activity did not occur: Safety/medical concerns(unsafe due to  inability to stand; letheray and low BP)  Assist level: 2 helpers Assistive device: Other (comment)(hallway rail) Max distance: 55ft   Walk 10 feet activity   Assist  Walk 10 feet activity did not occur: Safety/medical concerns  Assist level: Maximal Assistance - Patient 25 - 49% Assistive device: Walker-rolling   Walk 50 feet activity   Assist Walk 50 feet with 2 turns activity did not occur: Safety/medical concerns         Walk 150 feet activity   Assist Walk 150 feet activity did not occur: Safety/medical concerns         Walk 10 feet on uneven surface  activity   Assist Walk 10 feet on uneven surfaces activity did not occur: Safety/medical concerns         Wheelchair     Assist   Type of Wheelchair: Manual    Wheelchair assist level: Minimal Assistance - Patient > 75% Max wheelchair distance: 30'    Wheelchair 50 feet with 2 turns activity    Assist        Assist Level: Minimal Assistance - Patient > 75%   Wheelchair 150 feet activity     Assist      Assist Level: Dependent - Patient 0%   Blood pressure 133/82, pulse 77, temperature 98.4 F (36.9 C), temperature source Oral, resp. rate 18, height 5\' 7"  (1.702 m), weight 85.4 kg, SpO2 98 %.  1.  Left-sided weakness with facial droop and dysphagia secondary to large right MCA infarction with hemorrhagic conversion, embolic secondary to known A. fib on Eliquis as well as history of left MCA infarction 2018 status post thrombectomy for left M2 occlusion.  Anticoagulation on hold due to hemorrhage  Cont CIR  930- will try Provigil for attention- can't start Ritalin per QT prolongation nor Amantadine for same reason  10/6- will increase Provigil for AM-for attention 2.  Antithrombotics: -DVT/anticoagulation: Subcutaneous heparin initiated 11/11/2018  9/25- changed to Lovenox             Monitor for bleeding             -antiplatelet therapy: N/A 3. Pain Management: Neurontin 300 mg  3 times daily,tramadol as needed  9/25- Added Depakote 125 mg BID for headache since had new stroke.- seems to be helping--observe for neurosedation however 4. Mood: Valium 5 mg twice daily as needed anxiety             -antipsychotic agents: N/A 5. Neuropsych: This patient is not fully capable of making decisions on his own behalf. 6. Skin/Wound Care: Routine skin checks 7. Fluids/Electrolytes/Nutrition: Routine I/Os 8.  Atrial fibrillation status post ICD.  Follow cardiology services.  Patient with recent cardioversion.  Cardiac rate controlled 9.  Acute on chronic diastolic congestive heart failure.  Lasix 40 mg daily.  Monitor for any signs of fluid overload             Daily weights needed   Filed Weights   11/24/18 0500 11/25/18 0436 11/26/18 0500  Weight: 84.1 kg 83.1 kg 85.4 kg    Stable on 10/5 10.  Nonischemic cardiomyopathy.  Ejection fraction 15 to 20%.             See #9 11.  Hypertension.  Cont Coreg 12.5 mg twice daily, Aldactone 12.5 mg daily, Entresto 24-26 mg twice daily  Controlled on 10/5 12.  Hyperlipidemia.  Lipitor 13.  Post stroke dysphagia.  Dysphasia #1 thin liquids.  Follow-up speech therapy 14.  AKI.  Creatinine 1.23-1.54             Creatinine 1.31 on 10/5, continue to monitor  10/6- will restart IVFs NS 60cc/hr since per nursing, not drinking well  10/7- will recheck labs in AM 15.  History of tobacco/marijuana abuse as well as history of remote cocaine use.  Provide counseling 16.  Medical noncompliance.  Counseling 17.  Chronic hep C.  Follow-up outpatient 18. R trochanteric bursitis/R groin pain/Hip pain-  9/25- ordered lidocaine patch for R lateral hip  -ROM/Ice 19. Fever:  Improving, but has received Tylenol  -left eye doesn't appear infected but given temp, treating for blepharitis for now pending urine and chest  -continue to monitor  Continue Rocephin for Klebsiella UTI  10/6- nursing concerned about infection- on Rocephin til 10/8- NO OTHER  SIGNS of infection- WBC nml, no left shift; CXR (-).  20 . Poor initiation  9/29- cannot start Amantadine due to severely prolonged QT interval- will have to wait on med changes.  9/30 added Provigil 100 mg daily  10/1- changed to 6am so more awake for day- did help this AM  10/2- still an issue- can't start meds due to QT prolongation.  10/6- will increase to 200 mg daily. 21.  Labile blood glucose  Continue to monitor 22.  Acute blood loss anemia  Hemoglobin 12.6 on 10/5  Continue to monitor  23. Neurogenic bladder  10/7- in/out caths becoming painful- will order coude' cath q6 hours prn with lidocaine as needed  LOS: 13 days A FACE TO FACE EVALUATION WAS PERFORMED  Kama Cammarano 11/26/2018, 12:01 PM

## 2018-11-26 NOTE — Progress Notes (Signed)
Speech Language Pathology Daily Session Note  Patient Details  Name: Glenn [name] DOBKIN MRN: [id number] Date of Birth: [birth year]  Today's Date: 11/26/2018 SLP Individual Time: 1100-1200 SLP Individual Time Calculation (min): 60 min  Short Term Goals: Week 2: SLP Short Term Goal 1 (Week 2): Pt will consume current dysphagia 1 solid, thin liquid diet with min overt s/s aspiration, efficient mastication and oral clearance with Mod A verbal cues for use of swallow stategies. SLP Short Term Goal 2 (Week 2): Pt will maintain alertness with Min A and demonstrate efficient mastication and oral clearance with upgraded dysphagia 2 solid trials prior to solid advancement. SLP Short Term Goal 3 (Week 2): Pt will demonstate basic problem solving during familiar functional tasks with Mod A verbal/visual cues. SLP Short Term Goal 4 (Week 2): Pt will sustain attention for ~5 minutes with Mod A cues. SLP Short Term Goal 5 (Week 2): Pt will recall daily information with Min A for use of compensatory memory strategies. SLP Short Term Goal 6 (Week 2): Pt will increase speech intelligibility to ~75% at the phrase level and Mod A for use of clear speech strategies.  Skilled Therapeutic Interventions: Pt was seen for skilled ST targeting cognitive goals. 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 Pt was redirected and agreeable to participate in structured cognitive activities with Max A verbal cues and reasoning from SLP. Once focused on structured tasks, pt sustained attention for one 5-min interval with Mod A verbal cues for redirection, otherwise attention was only sustained for <91min intervals. Pt sorted money with Max A for error awareness, Mod A for problem solving corrections of errors. During a basic money management task (ALFA), pt counted money and  displayed set amounts of change with fluctuating Mod-Max A verbal and visual cues. Basic subtraction proved to be most difficult for pt, requiring highest level of assistance. SLP further facilitated session with a novel basic card game (WAR), during which pt identified the higher number (out of 2) with 100% accuracy given Min A question cues from clinician. Of note, pt demonstrated one instance of appropriate spontaneous initiation to meet his basic wants/needs, evidenced in his request for SLP to get him water. Pt was left in bed with alarm set and call bell in lap. Continue per current plan of care.      Pain Pain Assessment Pain Scale: Faces Faces Pain Scale: Hurts a little bit Pain Type: Acute pain Pain Location: Back Pain Orientation: Lower Pain Descriptors / Indicators: Aching Pain Intervention(s): RN made aware  Therapy/Group: Individual Therapy  Arbutus Leas 11/26/2018, 2:20 PM

## 2018-11-26 NOTE — Progress Notes (Signed)
Physical Therapy Weekly Progress Note  Patient Details  Name: Glenn [name] MRN: [id number] Date of Birth: April 16, 1950  Beginning of progress report period: November 21, 2018 End of progress report period: November 26, 2018  Today's Date: 11/26/2018 PT Individual Time: 0910-0956, 1320-1350, and 1400-1430  PT Individual Time Calculation (min): 46 min, 30 min, and 30 min   Patient has met 0 of 4 short term goals.  Patient has had limited progress this week due to increased lethargy and arousal during sessions. Increased arousal on 10/6 with IV bolus during therapy. He fluctuates with level of assist with mobility based on arousal. Currently requires total-mod A for bed mobility, max-min A for squat pivot and sit to stand transfers, min A for w/c mobility 30 feet, and was unable to ambulate with therapy this week. He continues to present with L hemiplegia and inattention L UE>LLE, decreased sitting and standing balance with poor midline awareness and motor control. Will continue to benefit from intensive skilled therapy to address these deficits.   Patient continues to demonstrate the following deficits muscle weakness and muscle joint tightness, decreased cardiorespiratoy endurance, impaired timing and sequencing, abnormal tone, unbalanced muscle activation, decreased coordination and decreased motor planning, decreased midline orientation, decreased attention to left and decreased motor planning, decreased initiation, decreased attention, decreased awareness, decreased problem solving, decreased safety awareness, decreased memory and delayed processing and decreased sitting balance, decreased standing balance, decreased postural control, hemiplegia and decreased balance strategies and therefore will continue to benefit from skilled PT intervention to increase functional independence with mobility.  Patient progressing toward long term goals..  Continue plan of care. Plan to extend LOS an additional  week due to slow progress.  PT Short Term Goals Week 2:  PT Short Term Goal 1 (Week 2): Pt will be able to perform bed mobility with mod assist consistently PT Short Term Goal 1 - Progress (Week 2): Progressing toward goal PT Short Term Goal 2 (Week 2): Pt will be able to perform basic transfers with mod assist consistently PT Short Term Goal 2 - Progress (Week 2): Progressing toward goal PT Short Term Goal 3 (Week 2): Pt will ambulate >10 feet using LRAD with max A PT Short Term Goal 3 - Progress (Week 2): Progressing toward goal PT Short Term Goal 4 (Week 2): Pt will be able to propel w/c with hemi technique 54' with supervision PT Short Term Goal 4 - Progress (Week 2): Progressing toward goal Week 3:  PT Short Term Goal 1 (Week 3): Pt will be able to propel w/c with hemi technique 45' with supervision PT Short Term Goal 2 (Week 3): Pt will ambulate >10 feet using LRAD with max A PT Short Term Goal 3 (Week 3): Pt will be able to perform basic transfers with mod assist consistently PT Short Term Goal 4 (Week 3): Pt will be able to perform bed mobility with mod assist consistently  Skilled Therapeutic Interventions/Progress Updates:     Session 1: Patient in w/c upon PT arrival. Patient alert and agreeable to PT session. Patient reported mild aching in B posterior knees at beginning of session, RN made aware. PT provided repositioning, rest breaks, and distraction as pain interventions throughout session. Patient reported increased fatigue from sitting in the w/c this morning and requested to get back to bed. He was agreeable to performing bed level activities.   Therapeutic Activity: Bed Mobility: Patient performed sit to supine with mod A for LE managment. Provided verbal cues for leaning down  to R elbow to side-lying then rolling to supine for increased independence with trunk managment. Transfers: Patient performed squat pivot transfer with mod-max A +2 due to patient initiating transfer  and feeling fatigued half way through. Provided verbal cues for hand placement with R UE and head-hips relationship.  Neuromuscular Re-ed: Patient performed B hip and knee flexion/extension 2x5; L elbow flexion/extension on 2x5; L shoulder extension 2x5; all with light manual resistance and PNF tapping to promote muscle activation. Patient reported decreased knee aching after.  Patient in bed with HOB elevated ~80 degrees to promote arousal in the bed at end of session with breaks locked, bed alarm set, and all needs within reach.   Session 2: Patient in bed upon PT arrival. Patient alert and agreeable to PT session, however reporting feeling lightheaded lying in the bed. Vitals in lying: BP: 115/51, HR: 40 (irregular), SPO2 100% on RA, Temp 99.8, RN made aware. Preceded slowly with mobility to sit EOB and reassess patient.    Therapeutic Activity: Bed Mobility: Patient performed supine to/from sit with max A in a flat bed without use of bed rails. Provided verbal cues for sliding LEs off the bed and pushing through elbows to sit up. Patient reported sudden headache with the bed flat that resolved in sitting.   Neuromuscular Re-education: Patient sat EOB x15 min with mod A-close supervision working on midline orientation and trunk control in sitting. Vitals in sitting: BP 138/78, HR 50, SPO2 98% on RA, Temp 100.3. Patient reported decreased light headedness sitting EOB, but increased fatigue and from sitting, and patient returned to supine. Discussed patient's vitals and symptoms with Alferd Patee and Linna Hoff, [place] following session.    Patient in bed with HOB elevated to 80 degrees at end of session with breaks locked, bed alarm set, and all needs within reach.   Session 3: Patient in bed as above upon PT arrival. Patient alert and agreeable to continue PT session. PT informed patient of discussion with NP and PA and they had determined to proceed with mobility as tolerated at this time.   Therapeutic  Activity: Bed Mobility: Patient performed supine to/from sit with mod a +2. Provided verbal cues as above. Transfers: Patient performed stand pivot x1 and sit to/from stand x1 with the RW with mod-max A +2 and x1 with mod-max A +2 with 3 musketeer technique. Provided verbal cues for leaning forward and pushing up from the arm rests of the w/c, provided manual facilitation of hip extension to stand and blocked L knee to prevent buckling.  Gait Training:  Patient ambulated 3 feet using a RW and 16 feet using 3 musketeer technique with max A +2. Ambulated with L trunk lean, L knee flexion, manual facilitation of L limb advancement and B weight shifts. Provided verbal cues for sequencing, L trunk elongation, L limb advancement, and L quad activation. Vitals after ambulation: BP 132/79, HR 77, SPO2 98% on RA, Temp 99.2.     Patient in w/c at end of session with breaks locked, seat belt alarm set, and all needs within reach. Provided patient with several sips of ginger-ale at end of session with cues for small sips and swallowing completely.  Therapy Documentation Precautions:  Precautions Precautions: Fall, ICD/Pacemaker Precaution Comments: L hemi; L inattention Restrictions Weight Bearing Restrictions: No   Therapy/Group: Individual Therapy  Dallas Torok L Barlow Harrison PT, DPT  11/26/2018, 12:16 PM

## 2018-11-26 NOTE — Progress Notes (Signed)
Occupational Therapy Session Note  Patient Details  Name: SENICA NILO MRN: [id number] Date of Birth: 01-Dec-1950  Today's Date: 11/26/2018 OT Individual Time: 0700-0810 OT Individual Time Calculation (min): 70 min    Short Term Goals: Week 2:  OT Short Term Goal 1 (Week 2): Pt will don shirt with mod A OT Short Term Goal 2 (Week 2): Pt will don pants with max A sit to stand with one caregiver OT Short Term Goal 3 (Week 2): Pt will demonstrate sustained attention to ADL task with min cues. OT Short Term Goal 4 (Week 2): Pt will complete toileting tasks with max A  Skilled Therapeutic Interventions/Progress Updates:    Pt resting in bed upon arrival and greeted therapist appropriately.  Pt oriented to place, situation, and year.  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  When challenged, pt continued to insist that he had completed training while in hospital. Pt unable to be persuaded that his statement was false.  Bed mobility-max A Sitting balance-CGA Squat pivot transfer to w/c-mod A Sit<>stand-max/tot A with limited initiation. Pt unable to turn head to L past midline and c/o tenderness/soreness R neck Pt c/o L wrist pain Pt required max multimodal cues to initiate and attend to grooming tasks seated in w/c. Pt required max verbal cues to attend to his L Pt remained seated in w/c with belt alarm activated, half lap tray in place, and all needs within reach.   Therapy Documentation Precautions:  Precautions Precautions: Fall, ICD/Pacemaker Precaution Comments: L hemi; L inattention Restrictions Weight Bearing Restrictions: No  Pain:  Pt c/o neck (R lateral) and L wrist discomfort; Kinesio tape applied to wrist; soft tissue mobilization to R neck   Therapy/Group: Individual Therapy  Leroy Libman 11/26/2018, 8:16 AM

## 2018-11-26 NOTE — Progress Notes (Signed)
New bag of normal saline was hung & is infusing at 73ml/hr as ordered. Patient's bladder was scanned at the 8 hour mark & was only showed 140ml of urine in the bladder. Waited a few more hours to allow the patient more time to urinate on his own & accumulate more volume but was unsuccessful,. Next scan was only for 227ml. He was cathed for 255ml of amber urine. Procedure is also starting to be painful for the patient. Will request the use of a coude cath with lidocaine from the provider. He is alert & responsive this morning. No signs of distress noted. No c/o pain or discomfort. He was afebrile last night & this morning.Will continue to monitor.

## 2018-11-26 NOTE — Progress Notes (Signed)
Called to room by PT for patient complaints of feeling hot, dizzy, with sudden headache when lying flat and improved once in a sitting position.  VSS with exception of temp -100.3 orally.  HR- irregular.  Lung sounds clear bilaterally.  Patient also notes blurred vision which he states is new.  PA made aware.  No new orders at this time.  Brita Romp, RN

## 2018-11-26 NOTE — Patient Care Conference (Signed)
Inpatient RehabilitationTeam Conference and Plan of Care Update Date: 11/26/2018   Time: 10:00 AM    Patient Name: Glenn [name]      Medical Record Number: [id number]  Date of Birth: [birth year] Sex: Male         Room/Bed: 4M11C/4M11C-01 Payor Info: Payor: [employer] MEDICARE / Plan: [place] HMO / Product Type: *No Product type* /    Admit Date/Time:  11/13/2018  4:31 PM  Primary Diagnosis:  Stroke (cerebrum) [facility])  Patient Active Problem List   Diagnosis Date Noted  . Acute blood loss anemia   . Labile blood glucose   . AKI (acute kidney injury) ([place])   . Chronic right hip pain   . Acute renal failure superimposed on stage 3a chronic kidney disease ([place])   . Cough with fever   . Headache due to intracranial disease 11/14/2018  . Trochanteric bursitis, right hip 11/14/2018  . Cerebral edema ([place]) 11/13/2018  . Hyperlipidemia LDL goal <70 11/13/2018  . Marijuana user 11/13/2018  . Right middle cerebral artery stroke ([place]) 11/13/2018  . Acute CVA (cerebrovascular accident) ([place])   . Noncompliance   . Dysphagia, post-stroke   . Acute on chronic combined systolic and diastolic CHF (congestive heart failure) ([place])   . Wide-complex tachycardia ([place])   . DCM (dilated cardiomyopathy) ([place])   . Atrial fibrillation ([place]) 11/04/2018  . Acute on chronic systolic heart failure ([place])   . Prolonged Q-T interval on ECG   . Elevated troponin   . CHF exacerbation ([place]) 10/30/2018  . Acute respiratory failure with hypoxia ([place])   . Acute kidney injury ([place])   . Entrapment of right ulnar nerve 02/28/2018  . Carpal tunnel syndrome of right wrist 02/28/2018  . Impotence due to erectile dysfunction 09/30/2017  . Solitary pulmonary nodule 06/10/2017  . Neck pain 04/06/2017  . Paroxysmal atrial fibrillation ([name])   . Visit for monitoring Tikosyn therapy 03/25/2017  . ICD (implantable cardioverter-defibrillator) in place 09/13/2016  . Chest pain 09/13/2016  . Tobacco abuse 09/13/2016   . Hospital discharge follow-up 09/13/2016  . Upper back pain 09/13/2016  . Housing problems 09/13/2016  . Persistent atrial fibrillation ([place])   . Atrial fibrillation with RVR ([place])   . Ischemic cardiomyopathy   . Cerebral infarction ([place])   . Stroke (cerebrum) ([name]) Lg L MCA infarct w/ hemorrhagic conversion, embolic d/t AF [id number]  . CHF (congestive heart failure) ([place]) 08/14/2016  . HTN (hypertension) 08/14/2016  . Chronic Hepatitis C  08/14/2016    Expected Discharge Date: Expected Discharge Date: 12/12/18  Team Members Present: Physician leading conference: Dr. Courtney Heys Social Worker Present: Ovidio Kin, LCSW Nurse Present: Judee Clara, LPN PT Present: Apolinar Junes, PT OT Present: Willeen Cass, OT;Roanna Epley, COTA SLP Present: Other (comment)([place]rin-Speech) PPS Coordinator present : Gunnar Fusi, SLP     Current Status/Progress Goal Weekly Team Focus  Bowel/Bladder   incontinent of bowel & bladder, LBM 10/4, periodic i/o caths with very low volumes after 8 hrs  regain the ability to void  cath schedule, timed toileting   Swallow/Nutrition/ Hydration   Dys 1/thin, Mod A use of swallow strategies, little to no progress this week due to lethargy and lack of carryover/response to cueing  Supervision A (may downgrade depending on progress this week)  carryover of swallow strategies, upgraded D2 trials   ADL's   functional status has deterioated over the past week; transfers with [facility] and max A for sit<>stand; bathing/dressing tot A; continued inattention to L; max  verbal cues to attend to task; some confusion although oriented to place and situation  min A overall  activity tolerance, BADL retraining, sitting balance, standing balance, functional transfers, LUE NMR   Mobility   Total-min A for all mobility, fluctuates with level of arousal  Min A overall  NMR to LUE/LLE, postural control retraining, transfers, re-initiate gait training, w/c mobility, cognitive  remediation   Communication   Mod A, speech intelligibility ~80% if implementing strategies  Supervision A (may downgrade depending on progress this week)  carryover of speech intelligibility strategies, intelligibility at sentence level   Safety/Cognition/ Behavioral Observations  Mod-Max A most basic cognitive tasks, little to no progress this week due to lethargy, lack of carryover, medical instability  Min A (may downgrade depending on progress this week)  basic problem solving, sustained attention, emergent awareness   Pain   no c/o pain, has tylenol, tramadol prn, lidocaine patches & neurontin scheduled  pain scale <2/10  assess & treat as needed   Skin   edema to LUE & LLE, sty to the left eye  no new areas of skin break down  assess q shift      *See Care Plan and progress notes for long and short-term goals.     Barriers to Discharge  Current Status/Progress Possible Resolutions Date Resolved   Nursing  Inaccessible home environment;Incontinence;Neurogenic Bowel & Bladder;Medical stability               PT  Decreased caregiver support;Lack of/limited family support;Home environment access/layout;Incontinence;Behavior  Unsure of d/c plan and caregiver support at this time. Limited progress due to decreased arousal, patient having hallucinations              OT                  SLP                SW                Discharge Planning/Teaching Needs:  Unsure of discharge plan due to amount of care pt will require at discharge. Son and friend reprot they will assist but do not feel they realize the amount of assit he will need. His insurance may also not cover NH      Team Discussion:  Progress has been minimal since last week. Was doing better last week. More alert today and participating in therapies. Goals still min assist level. Currently total assist. Poor iniation. Cognition/attention issues. Will need to confirm discharge plan due to pt will be much physical care at  discharge.  Revisions to Treatment Plan:  DC 10/23    Medical Summary Current Status: poor initiation; intermittent confusion-incontinent of bowel; was doing better last week- a little downhill- due to intention/cognition- said did flight testing- obviously didn't Weekly Focus/Goal: voided this AM - still needing caths intermittently; D1 diet secondary to poor attention to chewing.  Barriers to Discharge: Behavior;Decreased family/caregiver support;Home enviroment access/layout;Medical stability;Medication compliance;Other (comments);Neurogenic Bowel & Bladder;Incontinence  Barriers to Discharge Comments: cognition Possible Resolutions to Barriers: provigil dosing? go up or stop- trying to figure out. - fluctuates, due to arousal level. c/o joint pain all over- which is new- will address.   Continued Need for Acute Rehabilitation Level of Care: The patient requires daily medical management by a physician with specialized training in physical medicine and rehabilitation for the following reasons: Direction of a multidisciplinary physical rehabilitation program to maximize functional independence : Yes Medical management of  patient stability for increased activity during participation in an intensive rehabilitation regime.: Yes Analysis of laboratory values and/or radiology reports with any subsequent need for medication adjustment and/or medical intervention. : Yes   I attest that I was present, lead the team conference, and concur with the assessment and plan of the team. Teleconference held due to COVID 19   Denise Bramblett, Gardiner Rhyme 11/26/2018, 12:10 PM

## 2018-11-26 NOTE — Progress Notes (Signed)
Orthopedic Tech Progress Note Patient Details:  Glenn [name] 1950/10/14 [id number] Called in order to [facility] for bilateral PRAFOs Patient ID: BRODE OBENAUER, male   DOB: 1950/11/04, 68 y.o.   MRN: [id number]   Janit Pagan 11/26/2018, 3:17 PM

## 2018-11-27 ENCOUNTER — Inpatient Hospital Stay (HOSPITAL_COMMUNITY): Payer: Medicare HMO

## 2018-11-27 ENCOUNTER — Inpatient Hospital Stay (HOSPITAL_COMMUNITY): Payer: Medicare HMO | Admitting: Speech Pathology

## 2018-11-27 NOTE — Progress Notes (Signed)
Occupational Therapy Session Note  Patient Details  Name: Glenn [name] MRN: [id number] Date of Birth: [birth year]  Today's Date: 11/27/2018 OT Individual Time: 0700-0810 OT Individual Time Calculation (min): 70 min    Short Term Goals: Week 3:  OT Short Term Goal 1 (Week 3): Pt will don shirt with mod A OT Short Term Goal 2 (Week 3): Pt will don pants with max A sit to stand with one caregiver OT Short Term Goal 3 (Week 3): Pt will demonstrate sustained attention to ADL task with min cues. OT Short Term Goal 4 (Week 3): Pt will complete toileting tasks with max A  Skilled Therapeutic Interventions/Progress Updates:    Pt resting in w/c upon arrival.  OT intervention with focus on bathing at sink and dressing with sit<>stand in [place].  Pt required ma verbal cues for all task initiation and attention to task throughout session.  Pt required tot A/max A for dressing tasks and max A for bathing tasks.  Pt required tot A for sit<>stand in [place] with pt not initiating and unable to stand in upright position.  Pt perseverated on placing towel on his back for unknown reasons.  No confabulation this morning but unable to attend to tasks.  Pt unable to initiate any movement in LUE. Pt remained in w/c with belt alarm activated, half lap tray in place, and all needs within reach.   Therapy Documentation Precautions:  Precautions Precautions: Fall, ICD/Pacemaker Precaution Comments: L hemi; L inattention Restrictions Weight Bearing Restrictions: No General:   Vital Signs:   Pain:   ADL: ADL Eating: Minimal assistance Where Assessed-Eating: Bed level Grooming: Moderate assistance Upper Body Bathing: Moderate assistance Where Assessed-Upper Body Bathing: Wheelchair Lower Body Bathing: Maximal assistance Where Assessed-Lower Body Bathing: Wheelchair Upper Body Dressing: Not assessed Lower Body Dressing: Not assessed Toileting: Other (Comment)(+2) Toilet Transfer: Other  (comment)(+2) Toilet Transfer Method: Stand pivot, Squat pivot Toilet Transfer Equipment: Energy manager: Not assessed Vision   Perception    Praxis   Exercises:   Other Treatments:     Therapy/Group: Individual Therapy  Leroy Libman 11/27/2018, 8:16 AM

## 2018-11-27 NOTE — Progress Notes (Signed)
Physical Therapy Session Note  Patient Details  Name: JAQUEZ SEGRAVES MRN: [id number] Date of Birth: 04/18/1950  Today's Date: 11/27/2018 PT Individual Time: 0915-1035 PT Individual Time Calculation (min): 80 min   Short Term Goals:  Week 3:  PT Short Term Goal 1 (Week 3): Pt will be able to propel w/c with hemi technique 40' with supervision PT Short Term Goal 2 (Week 3): Pt will ambulate >10 feet using LRAD with max A PT Short Term Goal 3 (Week 3): Pt will be able to perform basic transfers with mod assist consistently PT Short Term Goal 4 (Week 3): Pt will be able to perform bed mobility with mod assist consistently      Skilled Therapeutic Interventions/Progress Updates:   Pt seated in w/c. He stated that his L posterior thigh was painful, like a muscle cramp.  Not rated.  Use of Stedy for transfer to standing, with +2 and max cues for initiation and use of RUE and RLE.  Once sitting on high seat of stedy, use of mirror for midline orientation, and high sitting>< standing x 5.  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  Seated with bil fee supported, therapeutic activity to build PVC puzzle per pic.  Using R hand, and min assist and max cues, pt reached L and chose a piece, then leaned R to compose puzzle on a table to his R.  When wt shifting and reaching R, he c/o R flank pain.  Pt used bil hands (L hand as gross assist) pt placed pillow cases on pillows with mod/max assist. Pt exhausted, and assisted into R sidelying with mod assist.   After resting several minutes in R side lying, neuromuscular re-education via multimodal cues for R hip flexion/extension, R knee extension/flexion, R hip abduction (clam shells) with hips and knee flexed.  R side lying> sitting with max assist. Sitting posture more symmetrical than at beginning of session. Pt c/o neck pain upon sitting up.   Use of Stedy to  return pt to w/c., mod/max of 1 person.   At end of session, pt seated with seat belt alarm set and needs at hand.  PT educated pt about use of heat pack for neck pain; pt willing to try.  PT placed heat pack on pt's neck, held in place with towels.        Therapy Documentation Precautions:  Precautions Precautions: Fall, ICD/Pacemaker Precaution Comments: L hemi; L inattention Restrictions Weight Bearing Restrictions: No          Therapy/Group: Individual Therapy  Coron Rossano 11/27/2018, 10:55 AM

## 2018-11-27 NOTE — Progress Notes (Addendum)
Speech Language Pathology Daily Session Note  Patient Details  Name: Glenn [name] MRN: [id number] Date of Birth: 11-Jul-1950  Today's Date: 11/27/2018 SLP Individual Time: 8889-1694 SLP Individual Time Calculation (min): 40 min  Short Term Goals: Week 2: SLP Short Term Goal 1 (Week 2): Pt will consume current dysphagia 1 solid, thin liquid diet with min overt s/s aspiration, efficient mastication and oral clearance with Mod A verbal cues for use of swallow stategies. SLP Short Term Goal 2 (Week 2): Pt will maintain alertness with Min A and demonstrate efficient mastication and oral clearance with upgraded dysphagia 2 solid trials prior to solid advancement. SLP Short Term Goal 3 (Week 2): Pt will demonstate basic problem solving during familiar functional tasks with Mod A verbal/visual cues. SLP Short Term Goal 4 (Week 2): Pt will sustain attention for ~5 minutes with Mod A cues. SLP Short Term Goal 5 (Week 2): Pt will recall daily information with Min A for use of compensatory memory strategies. SLP Short Term Goal 6 (Week 2): Pt will increase speech intelligibility to ~75% at the phrase level and Mod A for use of clear speech strategies.  Skilled Therapeutic Interventions: Pt was seen for skilled ST targeting dysphagia and communication goals. SLP facilitated session with trials of upgraded dysphagia 2 (minced) solids and Min increasing to Mod A verbal cues for efficient use of compensatory swallow strategies. Although pt's prevalence and efficiency of mastication is improved since admission, he continues to exhibit inconsistent use of swallow strategies and awareness of boluses (due to cognitive impairments). Therefore, he remains cognitively inappropriate for solid advancement at this time. ST will continue to work diligently toward preparing pt for diet advancement as he is better able to consistently maintain alertness and use swallow strategies. SLP further facilitated session with Max A  verbal and visual cues for pt to locate external aid posted in room to verbally recall clear speech strategies. In a phrase/short sentence level barrier game, pt initially used increased vocal intensity, slow rate, and overarticulation with only Min A verbal cues and was ~90% intelligible. However, pt became distracted, somewhat confused (began talking about someone who he claimed came in and painted his room), and more lethargic throughout session, which resulted in need for increased Mod-Max A verbal cueing for use of intelligibility strategies and overall decrease in intelligibility (~75%). Pt was left sitting in wheelchair with seatbelt alarm engaged, call bell within reach, and all needs met. Continue per current plan of care.      Pain Pain Assessment Pain Scale: Faces Faces Pain Scale: Hurts a little bit Pain Type: Acute pain Pain Location: Neck  Therapy/Group: Individual Therapy  Arbutus Leas 11/27/2018, 3:01 PM

## 2018-11-27 NOTE — Progress Notes (Signed)
[place] PHYSICAL MEDICINE & REHABILITATION PROGRESS NOTE   Subjective/Complaints:  Pt reports doing pretty well- per OT< waking up a little more- but still dependent for therapy.   ROS: Limited due to cognition   Objective:   No results found. Recent Labs    11/25/18 1006  WBC 8.0  HGB 13.3  HCT 40.4  PLT 180   Recent Labs    11/25/18 1006  NA 142  K 4.0  CL 105  CO2 24  GLUCOSE 107*  BUN 17  CREATININE 1.30*  CALCIUM 9.1    Intake/Output Summary (Last 24 hours) at 11/27/2018 1630 Last data filed at 11/27/2018 1250 Gross per 24 hour  Intake 820 ml  Output -  Net 820 ml     Physical Exam: Vital Signs Blood pressure 121/63, pulse 80, temperature 98.7 F (37.1 C), temperature source Oral, resp. rate 16, height 5\' 7"  (1.702 m), weight (P) 88.4 kg, SpO2 99 %. Constitutional: No distress . Vital signs and  nursing notes reviewed. Pt awake, sitting up in manual w/c, OT putting on socks, poor initiation on but answered basic questions,, NAD HENT: Normocephalic.  Atraumatic. Eyes: EOMI. No discharge.  Left eye stye. Cardiovascular: regular rate; intermittently irregular Respiratory: Normal effort.  CTA B/L- good air movement GI: Non-distended. NT, (+)BS, soft Skin: Warm and dry.  Intact. Psych: more awake than normal. Musc: No edema in extremities.  No tenderness in extremities. Neurological:  Alert Left facial weakness Follows simple commands with cues- sometimes max cues Right upper extremity: 5/5 proximal distal Right lower extremity: 4-4+/5 proximal distal Left upper extremity: 0/5 proximal to distal Left lower extremity: 2/5 proximal to distal   Assessment/Plan: 1. Functional deficits secondary to R MCA infarct with L hemiparesis which require 3+ hours per day of interdisciplinary therapy in a comprehensive inpatient rehab setting.  Physiatrist is providing close team supervision and 24 hour management of active medical problems listed  below.  Physiatrist and rehab team continue to assess barriers to discharge/monitor patient progress toward functional and medical goals  Care Tool:  Bathing  Bathing activity did not occur: Refused Body parts bathed by patient: Left arm, Chest, Abdomen, Left upper leg, Right lower leg   Body parts bathed by helper: Right arm, Left arm, Front perineal area, Buttocks, Right upper leg, Left upper leg, Right lower leg, Left lower leg     Bathing assist Assist Level: Maximal Assistance - Patient 24 - 49%     Upper Body Dressing/Undressing Upper body dressing   What is the patient wearing?: Pull over shirt    Upper body assist Assist Level: Total Assistance - Patient < 25%    Lower Body Dressing/Undressing Lower body dressing      What is the patient wearing?: Pants, Incontinence brief     Lower body assist Assist for lower body dressing: Total Assistance - Patient < 25%     Toileting Toileting    Toileting assist Assist for toileting: Total Assistance - Patient < 25%     Transfers Chair/bed transfer  Transfers assist     Chair/bed transfer assist level: Dependent - mechanical lift     Locomotion Ambulation   Ambulation assist   Ambulation activity did not occur: Safety/medical concerns(unsafe due to inability to stand; letheray and low BP)  Assist level: 2 helpers Assistive device: Other (comment)(3 musketeer method) Max distance: 16'   Walk 10 feet activity   Assist  Walk 10 feet activity did not occur: Safety/medical concerns  Assist level:  2 helpers Assistive device: Walker-rolling   Walk 50 feet activity   Assist Walk 50 feet with 2 turns activity did not occur: Safety/medical concerns         Walk 150 feet activity   Assist Walk 150 feet activity did not occur: Safety/medical concerns         Walk 10 feet on uneven surface  activity   Assist Walk 10 feet on uneven surfaces activity did not occur: Safety/medical concerns          Wheelchair     Assist   Type of Wheelchair: Manual    Wheelchair assist level: Minimal Assistance - Patient > 75% Max wheelchair distance: 30'    Wheelchair 50 feet with 2 turns activity    Assist        Assist Level: Minimal Assistance - Patient > 75%   Wheelchair 150 feet activity     Assist      Assist Level: Dependent - Patient 0%   Blood pressure 121/63, pulse 80, temperature 98.7 F (37.1 C), temperature source Oral, resp. rate 16, height 5\' 7"  (1.702 m), weight (P) 88.4 kg, SpO2 99 %.  1.  Left-sided weakness with facial droop and dysphagia secondary to large right MCA infarction with hemorrhagic conversion, embolic secondary to known A. fib on Eliquis as well as history of left MCA infarction 2018 status post thrombectomy for left M2 occlusion.  Anticoagulation on hold due to hemorrhage  Cont CIR  930- will try Provigil for attention- can't start Ritalin per QT prolongation nor Amantadine for same reason  10/6- will increase Provigil for AM-for attention 2.  Antithrombotics: -DVT/anticoagulation: Subcutaneous heparin initiated 11/11/2018  9/25- changed to Lovenox             Monitor for bleeding             -antiplatelet therapy: N/A 3. Pain Management: Neurontin 300 mg 3 times daily,tramadol as needed  9/25- Added Depakote 125 mg BID for headache since had new stroke.- seems to be helping--observe for neurosedation however 4. Mood: Valium 5 mg twice daily as needed anxiety             -antipsychotic agents: N/A 5. Neuropsych: This patient is not fully capable of making decisions on his own behalf. 6. Skin/Wound Care: Routine skin checks 7. Fluids/Electrolytes/Nutrition: Routine I/Os 8.  Atrial fibrillation status post ICD.  Follow cardiology services.  Patient with recent cardioversion.  Cardiac rate controlled 9.  Acute on chronic diastolic congestive heart failure.  Lasix 40 mg daily.  Monitor for any signs of fluid overload             Daily  weights needed   Filed Weights   11/25/18 0436 11/26/18 0500 11/27/18 0636  Weight: 83.1 kg 85.4 kg (P) 88.4 kg    Stable on 10/5 10.  Nonischemic cardiomyopathy.  Ejection fraction 15 to 20%.             See #9 10/8- Weight up significantly- will make sure off IVFs, and check CXR in AM for lfuid overload.  11.  Hypertension.  Cont Coreg 12.5 mg twice daily, Aldactone 12.5 mg daily, Entresto 24-26 mg twice daily  Controlled on 10/5 12.  Hyperlipidemia.  Lipitor 13.  Post stroke dysphagia.  Dysphasia #1 thin liquids.  Follow-up speech therapy 14.  AKI.  Creatinine 1.23-1.54             Creatinine 1.31 on 10/5, continue to monitor  10/6- will restart  IVFs NS 60cc/hr since per nursing, not drinking well  10/7- will recheck labs in AM 15.  History of tobacco/marijuana abuse as well as history of remote cocaine use.  Provide counseling 16.  Medical noncompliance.  Counseling 17.  Chronic hep C.  Follow-up outpatient 18. R trochanteric bursitis/R groin pain/Hip pain-  9/25- ordered lidocaine patch for R lateral hip  -ROM/Ice 19. Fever:  Improving, but has received Tylenol  -left eye doesn't appear infected but given temp, treating for blepharitis for now pending urine and chest  -continue to monitor  Continue Rocephin for Klebsiella UTI  10/6- nursing concerned about infection- on Rocephin til 10/8- NO OTHER SIGNS of infection- WBC nml, no left shift; CXR (-).  20 . Poor initiation  9/29- cannot start Amantadine due to severely prolonged QT interval- will have to wait on med changes.  9/30 added Provigil 100 mg daily  10/1- changed to 6am so more awake for day- did help this AM  10/2- still an issue- can't start meds due to QT prolongation.  10/6- will increase to 200 mg daily. 21.  Labile blood glucose  Continue to monitor 22.  Acute blood loss anemia  Hemoglobin 12.6 on 10/5  Continue to monitor  23. Neurogenic bladder  10/7- in/out caths becoming painful- will order coude' cath  q6 hours prn with lidocaine as needed  LOS: 14 days A FACE TO FACE EVALUATION WAS PERFORMED  Kallie Depolo 11/27/2018, 4:30 PM

## 2018-11-27 NOTE — Progress Notes (Signed)
Occupational Therapy Weekly Progress Note  Patient Details  Name: Glenn [name] MRN: [id number] Date of Birth: 11/14/1950  Beginning of progress report period: November 20, 2018 End of progress report period: November 27, 2018  Patient has met 0 of 4 short term goals.  Pt progress has been minimal and inconsistent this past week.  Pt requires max/mod A for supine>sit EOB and max A for squat pivot trasnsfers.  Pt requires sit<>stand from w/c with max A and does not come up to full upright position. Pt is oriented to place, situation, and year but continues to describe events that did not occur inside hospital.  Pt requires max verbal cues to initiate and attend to tasks and currently requires max/tot a for BADLs. Pt requires max verbal cues to attend to his L. Pt's discharge has been extended until 10/23.  Patient continues to demonstrate the following deficits: muscle weakness, decreased cardiorespiratoy endurance, impaired timing and sequencing, abnormal tone, unbalanced muscle activation, motor apraxia, ataxia, decreased coordination and decreased motor planning, decreased visual perceptual skills, decreased midline orientation and decreased attention to left, decreased initiation, decreased attention, decreased awareness, decreased problem solving, decreased safety awareness, decreased memory and delayed processing and decreased sitting balance, decreased standing balance, decreased postural control, hemiplegia and decreased balance strategies and therefore will continue to benefit from skilled OT intervention to enhance overall performance with BADL and Reduce care partner burden.  Patient progressing toward long term goals..  Continue plan of care.  OT Short Term Goals Week 2:  OT Short Term Goal 1 (Week 2): Pt will don shirt with mod A OT Short Term Goal 1 - Progress (Week 2): Progressing toward goal OT Short Term Goal 2 (Week 2): Pt will don pants with max A sit to stand with one  caregiver OT Short Term Goal 2 - Progress (Week 2): Progressing toward goal OT Short Term Goal 3 (Week 2): Pt will demonstrate sustained attention to ADL task with min cues. OT Short Term Goal 3 - Progress (Week 2): Progressing toward goal OT Short Term Goal 4 (Week 2): Pt will complete toileting tasks with max A OT Short Term Goal 4 - Progress (Week 2): Progressing toward goal Week 3:  OT Short Term Goal 1 (Week 3): Pt will don shirt with mod A OT Short Term Goal 2 (Week 3): Pt will don pants with max A sit to stand with one caregiver OT Short Term Goal 3 (Week 3): Pt will demonstrate sustained attention to ADL task with min cues. OT Short Term Goal 4 (Week 3): Pt will complete toileting tasks with max A     Lanier, Thomas Chappell 11/27/2018, 6:29 AM   

## 2018-11-28 ENCOUNTER — Inpatient Hospital Stay (HOSPITAL_COMMUNITY): Payer: Medicare HMO

## 2018-11-28 ENCOUNTER — Inpatient Hospital Stay (HOSPITAL_COMMUNITY): Payer: Medicare HMO | Admitting: Speech Pathology

## 2018-11-28 ENCOUNTER — Inpatient Hospital Stay (HOSPITAL_COMMUNITY): Payer: Medicare HMO | Admitting: Occupational Therapy

## 2018-11-28 LAB — CBC WITH DIFFERENTIAL/PLATELET
Abs Immature Granulocytes: 0.02 10*3/uL (ref 0.00–0.07)
Basophils Absolute: 0 10*3/uL (ref 0.0–0.1)
Basophils Relative: 0 %
Eosinophils Absolute: 0.3 10*3/uL (ref 0.0–0.5)
Eosinophils Relative: 6 %
HCT: 33.6 % — ABNORMAL LOW (ref 39.0–52.0)
Hemoglobin: 11.3 g/dL — ABNORMAL LOW (ref 13.0–17.0)
Immature Granulocytes: 0 %
Lymphocytes Relative: 23 %
Lymphs Abs: 1.1 10*3/uL (ref 0.7–4.0)
MCH: 31.8 pg (ref 26.0–34.0)
MCHC: 33.6 g/dL (ref 30.0–36.0)
MCV: 94.6 fL (ref 80.0–100.0)
Monocytes Absolute: 0.6 10*3/uL (ref 0.1–1.0)
Monocytes Relative: 13 %
Neutro Abs: 2.7 10*3/uL (ref 1.7–7.7)
Neutrophils Relative %: 58 %
Platelets: 188 10*3/uL (ref 150–400)
RBC: 3.55 MIL/uL — ABNORMAL LOW (ref 4.22–5.81)
RDW: 11.9 % (ref 11.5–15.5)
WBC: 4.7 10*3/uL (ref 4.0–10.5)
nRBC: 0 % (ref 0.0–0.2)

## 2018-11-28 LAB — COMPREHENSIVE METABOLIC PANEL
ALT: 18 U/L (ref 0–44)
AST: 27 U/L (ref 15–41)
Albumin: 2.5 g/dL — ABNORMAL LOW (ref 3.5–5.0)
Alkaline Phosphatase: 53 U/L (ref 38–126)
Anion gap: 12 (ref 5–15)
BUN: 16 mg/dL (ref 8–23)
CO2: 24 mmol/L (ref 22–32)
Calcium: 8.6 mg/dL — ABNORMAL LOW (ref 8.9–10.3)
Chloride: 103 mmol/L (ref 98–111)
Creatinine, Ser: 1.05 mg/dL (ref 0.61–1.24)
GFR calc Af Amer: >60 mL/min (ref >60)
GFR calc non Af Amer: >60 mL/min (ref >60)
Glucose, Bld: 110 mg/dL — ABNORMAL HIGH (ref 70–99)
Potassium: 4.3 mmol/L (ref 3.5–5.1)
Sodium: 139 mmol/L (ref 135–145)
Total Bilirubin: 0.8 mg/dL (ref 0.3–1.2)
Total Protein: 5.8 g/dL — ABNORMAL LOW (ref 6.5–8.1)

## 2018-11-28 LAB — BRAIN NATRIURETIC PEPTIDE: B Natriuretic Peptide: 916.4 pg/mL — ABNORMAL HIGH (ref 0.0–100.0)

## 2018-11-28 MED ORDER — TOPIRAMATE 25 MG PO TABS
25.0000 mg | ORAL_TABLET | Freq: Every day | ORAL | Status: DC
  Administered 2018-11-28 – 2018-12-01 (×4): 25 mg via ORAL
  Filled 2018-11-28 (×4): qty 1

## 2018-11-28 MED ORDER — FUROSEMIDE 40 MG PO TABS
40.0000 mg | ORAL_TABLET | Freq: Once | ORAL | Status: AC
  Administered 2018-11-28: 40 mg via ORAL
  Filled 2018-11-28: qty 1

## 2018-11-28 NOTE — Progress Notes (Signed)
Occupational Therapy Session Note  Patient Details  Name: Glenn [name] MRN: [id number] Date of Birth: June 04, 1950  Today's Date: 11/28/2018 OT Individual Time: 1300-1400 OT Individual Time Calculation (min): 60 min    Short Term Goals: Week 3:  OT Short Term Goal 1 (Week 3): Pt will don shirt with mod A OT Short Term Goal 2 (Week 3): Pt will don pants with max A sit to stand with one caregiver OT Short Term Goal 3 (Week 3): Pt will demonstrate sustained attention to ADL task with min cues. OT Short Term Goal 4 (Week 3): Pt will complete toileting tasks with max A  Skilled Therapeutic Interventions/Progress Updates:    Treatment session with focus on Lt attention, LUE NMR, and midline orientation in sitting and standing.  Pt received upright in w/c somewhat alert.  Engaged in [place] in sitting with use of UE Ranger with focus on shoulder flexion/extension and horizontal abduction adduction.  Provided cues throughout for attention to task and Lt attention.  Engaged in reaching with hand over hand assist to facilitate increased reach, pt demonstrating initiation of movements.  Engaged in sit > stand x3 in [place] with focus on anterior weight shift for sit > stand.  Once upright, focus was placed on midline orientation and weight shifting to obtain midline.  Pt unable to maintain midline orientation despite multimodal cues including use of mirror.  Pt frequently nodding off during session, requiring cues for arousal.  Returned to bed via [place] due to fatigue and left semi-reclined with all needs in reach.   Therapy Documentation Precautions:  Precautions Precautions: Fall, ICD/Pacemaker Precaution Comments: L hemi; L inattention Restrictions Weight Bearing Restrictions: No General:   Vital Signs: Therapy Vitals Temp: 98.4 F (36.9 C) Temp Source: Oral Pulse Rate: (!) 54 BP: 127/60 Patient Position (if appropriate): Sitting Oxygen Therapy SpO2: 99 % O2 Device: Room  Air Pain: Pain Assessment Pain Scale: 0-10 Pain Score: 6  Faces Pain Scale: Hurts little more Pain Type: Acute pain Pain Location: Neck Pain Orientation: Left Pain Descriptors / Indicators: Aching;Discomfort;Spasm Pain Frequency: Constant Pain Onset: On-going Patients Stated Pain Goal: 0 Pain Intervention(s): Medication (See eMAR) Multiple Pain Sites: Yes 2nd Pain Site Pain Score: 5 Pain Type: Acute pain Pain Location: Leg Pain Orientation: Right Pain Intervention(s): RN made aware   Therapy/Group: Individual Therapy  Simonne Come 11/28/2018, 3:09 PM

## 2018-11-28 NOTE — Progress Notes (Signed)
Speech Language Pathology Weekly Progress and Session Note  Patient Details  Name: Glenn [name] MRN: [id number] Date of Birth: 04/01/1950  Beginning of progress report period: November 21, 2018 End of progress report period: November 28, 2018  Today's Date: 11/28/2018 SLP Individual Time: 1101-1200 SLP Individual Time Calculation (min): 59 min  Short Term Goals: Week 2: SLP Short Term Goal 1 (Week 2): Pt will consume current dysphagia 1 solid, thin liquid diet with min overt s/s aspiration, efficient mastication and oral clearance with Mod A verbal cues for use of swallow stategies. SLP Short Term Goal 1 - Progress (Week 2): Met SLP Short Term Goal 2 (Week 2): Pt will maintain alertness with Min A and demonstrate efficient mastication and oral clearance with upgraded dysphagia 2 solid trials prior to solid advancement. SLP Short Term Goal 2 - Progress (Week 2): Progressing toward goal SLP Short Term Goal 3 (Week 2): Pt will demonstate basic problem solving during familiar functional tasks with Mod A verbal/visual cues. SLP Short Term Goal 3 - Progress (Week 2): Progressing toward goal SLP Short Term Goal 4 (Week 2): Pt will sustain attention for ~5 minutes with Mod A cues. SLP Short Term Goal 4 - Progress (Week 2): Progressing toward goal SLP Short Term Goal 5 (Week 2): Pt will recall daily information with Min A for use of compensatory memory strategies. SLP Short Term Goal 5 - Progress (Week 2): Progressing toward goal SLP Short Term Goal 6 (Week 2): Pt will increase speech intelligibility to ~75% at the phrase level and Mod A for use of clear speech strategies. SLP Short Term Goal 6 - Progress (Week 2): Met    New Short Term Goals: Week 3: SLP Short Term Goal 1 (Week 3): Pt will consume current dysphagia 1 solid, thin liquid diet with min overt s/s aspiration, efficient mastication and oral clearance with Min A verbal cues for use of swallow stategies. SLP Short Term Goal 2 (Week  3): Pt will maintain alertness with Min A and demonstrate efficient mastication and oral clearance with upgraded dysphagia 2 solid trials prior to solid advancement. SLP Short Term Goal 3 (Week 3): Pt will demonstate basic problem solving during familiar functional tasks with Mod A verbal/visual cues. SLP Short Term Goal 4 (Week 3): Pt will sustain attention for ~5 minutes with Mod A cues. SLP Short Term Goal 5 (Week 3): Pt will recall daily information with Min A for use of compensatory memory strategies. SLP Short Term Goal 6 (Week 3): Pt will increase speech intelligibility to ~80% at the sentence level and Min A for use of clear speech strategies.  Weekly Progress Updates: Pt has made inconsistent progress and very minimal gains this reporting period; he met 2 out of 6 short term goals. Pt is currently Mod-Max assist for due to severe cognitive impairments. Pt is consuming dysphagia 1 diet with thin liquids. Pt has demonstrated improved speech intelligibility when fully alert and implementation of clear speech strategies, as well as efficiency of mastication of solid POs. Pt education is ongoing; no family has been present during ST throughout pt's stay, with the exception of initial evaluation. Pt would continue to benefit from skilled ST while inpatient in order to maximize functional independence and reduce burden of care prior to discharge. Anticipate that pt will need strict 24/7 supervision at discharge in addition to ST follow up at next level of care.     Intensity: Minumum of 1-2 x/day, 30 to 90 minutes Frequency: 3 to   5 out of 7 days Duration/Length of Stay: 12/12/18 Treatment/Interventions: Cognitive remediation/compensation;Environmental controls;Internal/external aids;Speech/Language facilitation;Patient/family education;Functional tasks;Dysphagia/aspiration precaution training;Cueing hierarchy   Daily Session  Skilled Therapeutic Interventions: Pt was seen for skilled ST targeting  dysphagia and cognitive goals. SLP facilitated session with upgraded dysphagia 2 (minced) trials. Pt required Mod-Max A verbal cues to implement swallow strategies for efficient clearance of left buccal pocketing. Pt was with much improved ability to maintain alertness during intake in comparison to previous sessions, however he continues to be very limited by pain and internal/external distractions - he requested to cease trials after only 5 presentations. Thus, recommend continue current dysphagia 1 (puree) diet, thin liquids, meds crushed with full supervision during meals. SLP further facilitated session with Max A verbal cues for redirection during basic structured tasks. He sorted cards by color (field of 6) with 100% accuracy, demonstrating 2 instances of ability to recognize and correct errors without intervention from SLP. Pt also verbally recalled instructions for a familiar card game (WAR) and identified higher value cards (out of 2) with 100% accuracy. Max A multimodal cues required for pt's sustained attention and participation in tasks due to pain. SLP assisted pt in using call bell to request pain medicine, during which pt exhibited excellent use of clear speech strategies and effectively communicated his message to RN without need for repetition when provided 1 verbal reminder from SLP. Pt was left in wheelchair with seatbelt alarm engaged and all needs within reach. Continue per current plan of care.        Pain Pain Assessment Pain Scale: 0-10 Pain Score: 6  Pain Type: Acute pain Pain Location: Neck Pain Orientation: Right Pain Descriptors / Indicators: Aching;Discomfort Pain Frequency: Constant Pain Onset: On-going Patients Stated Pain Goal: 2 Pain Intervention(s): Medication (See eMAR)  Therapy/Group: Individual Therapy  Erin E Smith 11/28/2018, 7:18 AM         

## 2018-11-28 NOTE — Progress Notes (Signed)
[facility] PHYSICAL MEDICINE & REHABILITATION PROGRESS NOTE   Subjective/Complaints:  Pt reports HA is still a big problem this AM again- L temple/side of head.  Wants to change meds- current regimen not working.  ROS: Limited due to cognition   Objective:   Dg Chest 2 View  Result Date: 11/27/2018 CLINICAL DATA:  Fluid overload smoker history of CHF EXAM: CHEST - 2 VIEW COMPARISON:  11/25/2018 FINDINGS: Left-sided pacing device as before. Cardiomegaly. No significant pleural effusion. Aortic atherosclerosis. Streaky atelectasis left base. IMPRESSION: Cardiomegaly without overt failure or significant pleural effusion. Streaky atelectasis left base. Electronically Signed   By: Donavan Foil M.D.   On: 11/27/2018 21:27   Recent Labs    11/25/18 1006 11/28/18 0551  WBC 8.0 4.7  HGB 13.3 11.3*  HCT 40.4 33.6*  PLT 180 188   Recent Labs    11/25/18 1006 11/28/18 0551  NA 142 139  K 4.0 4.3  CL 105 103  CO2 24 24  GLUCOSE 107* 110*  BUN 17 16  CREATININE 1.30* 1.05  CALCIUM 9.1 8.6*    Intake/Output Summary (Last 24 hours) at 11/28/2018 0955 Last data filed at 11/27/2018 1820 Gross per 24 hour  Intake 440 ml  Output -  Net 440 ml     Physical Exam: Vital Signs Blood pressure 92/68, pulse 72, temperature 98.4 F (36.9 C), temperature source Oral, resp. rate 18, height 5\' 7"  (1.702 m), weight 87.2 kg, SpO2 97 %. Constitutional: No distress . Vital signs and  nursing notes reviewed. Pt awake, tapping L fingers on side of head constantly- maybe to make point about headache, NAD HENT: Normocephalic.  Atraumatic. Eyes: EOMI. No discharge.  Left eye stye continues to improve Cardiovascular: regular rate; intermittently irregular Respiratory: Normal effort.  CTA B/L- good air movement still GI: Non-distended. NT, (+)BS, soft Skin: Warm and dry.  Intact. Psych: less awake than normal. Musc: No edema in extremities.  No tenderness in extremities. Neurological:   Alert Left facial weakness Follows simple commands with cues- sometimes max cues Right upper extremity: 5/5 proximal distal Right lower extremity: 4-4+/5 proximal distal Left upper extremity: 0/5 proximal to distal Left lower extremity: 2/5 proximal to distal   Assessment/Plan: 1. Functional deficits secondary to R MCA infarct with L hemiparesis which require 3+ hours per day of interdisciplinary therapy in a comprehensive inpatient rehab setting.  Physiatrist is providing close team supervision and 24 hour management of active medical problems listed below.  Physiatrist and rehab team continue to assess barriers to discharge/monitor patient progress toward functional and medical goals  Care Tool:  Bathing  Bathing activity did not occur: Refused Body parts bathed by patient: Left arm, Chest, Abdomen, Left upper leg, Right lower leg   Body parts bathed by helper: Right arm, Left arm, Front perineal area, Buttocks, Right upper leg, Left upper leg, Right lower leg, Left lower leg     Bathing assist Assist Level: Maximal Assistance - Patient 24 - 49%     Upper Body Dressing/Undressing Upper body dressing   What is the patient wearing?: Pull over shirt    Upper body assist Assist Level: Total Assistance - Patient < 25%    Lower Body Dressing/Undressing Lower body dressing      What is the patient wearing?: Pants, Incontinence brief     Lower body assist Assist for lower body dressing: Total Assistance - Patient < 25%     Toileting Toileting    Toileting assist Assist for toileting: Total  Assistance - Patient < 25%     Transfers Chair/bed transfer  Transfers assist     Chair/bed transfer assist level: Dependent - mechanical lift     Locomotion Ambulation   Ambulation assist   Ambulation activity did not occur: Safety/medical concerns(unsafe due to inability to stand; letheray and low BP)  Assist level: 2 helpers Assistive device: Other (comment)(3  musketeer method) Max distance: 16'   Walk 10 feet activity   Assist  Walk 10 feet activity did not occur: Safety/medical concerns  Assist level: 2 helpers Assistive device: Walker-rolling   Walk 50 feet activity   Assist Walk 50 feet with 2 turns activity did not occur: Safety/medical concerns         Walk 150 feet activity   Assist Walk 150 feet activity did not occur: Safety/medical concerns         Walk 10 feet on uneven surface  activity   Assist Walk 10 feet on uneven surfaces activity did not occur: Safety/medical concerns         Wheelchair     Assist   Type of Wheelchair: Manual    Wheelchair assist level: Minimal Assistance - Patient > 75% Max wheelchair distance: 30'    Wheelchair 50 feet with 2 turns activity    Assist        Assist Level: Minimal Assistance - Patient > 75%   Wheelchair 150 feet activity     Assist      Assist Level: Dependent - Patient 0%   Blood pressure 92/68, pulse 72, temperature 98.4 F (36.9 C), temperature source Oral, resp. rate 18, height 5\' 7"  (1.702 m), weight 87.2 kg, SpO2 97 %.  1.  Left-sided weakness with facial droop and dysphagia secondary to large right MCA infarction with hemorrhagic conversion, embolic secondary to known A. fib on Eliquis as well as history of left MCA infarction 2018 status post thrombectomy for left M2 occlusion.  Anticoagulation on hold due to hemorrhage  Cont CIR  930- will try Provigil for attention- can't start Ritalin per QT prolongation nor Amantadine for same reason  10/6- will increase Provigil for AM-for attention  10/9- needs more assistance, but nothing is available.  2.  Antithrombotics: -DVT/anticoagulation: Subcutaneous heparin initiated 11/11/2018  9/25- changed to Lovenox             Monitor for bleeding             -antiplatelet therapy: N/A 3. Pain Management: Neurontin 300 mg 3 times daily,tramadol as needed  9/25- Added Depakote 125 mg BID  for headache since had new stroke.- seems to be helping--observe for neurosedation however  10/9- will add Topamax 25 mg QHS and maintain Depakote for now.  4. Mood: Valium 5 mg twice daily as needed anxiety             -antipsychotic agents: N/A 5. Neuropsych: This patient is not fully capable of making decisions on his own behalf. 6. Skin/Wound Care: Routine skin checks 7. Fluids/Electrolytes/Nutrition: Routine I/Os 8.  Atrial fibrillation status post ICD.  Follow cardiology services.  Patient with recent cardioversion.  Cardiac rate controlled 9.  Acute on chronic diastolic congestive heart failure.  Lasix 40 mg daily.  Monitor for any signs of fluid overload             Daily weights needed   Filed Weights   11/26/18 0500 11/27/18 0636 11/28/18 0341  Weight: 85.4 kg 88.4 kg 87.2 kg    Stable on 10/5  10/9- weight down 1+ kg- CXR looks ok- BUN/Cr great- BNP 916- will give 1 dose Lasix 40 mg x1. 10.  Nonischemic cardiomyopathy.  Ejection fraction 15 to 20%.             See #9 10/8- Weight up significantly- will make sure off IVFs, and check CXR in AM for lfuid overload.  11.  Hypertension.  Cont Coreg 12.5 mg twice daily, Aldactone 12.5 mg daily, Entresto 24-26 mg twice daily  Controlled on 10/5 12.  Hyperlipidemia.  Lipitor 13.  Post stroke dysphagia.  Dysphasia #1 thin liquids.  Follow-up speech therapy 14.  AKI.  Creatinine 1.23-1.54             Creatinine 1.31 on 10/5, continue to monitor  10/6- will restart IVFs NS 60cc/hr since per nursing, not drinking well  10/7- will recheck labs in AM 15.  History of tobacco/marijuana abuse as well as history of remote cocaine use.  Provide counseling 16.  Medical noncompliance.  Counseling 17.  Chronic hep C.  Follow-up outpatient 18. R trochanteric bursitis/R groin pain/Hip pain-  9/25- ordered lidocaine patch for R lateral hip  -ROM/Ice 19. Fever:  Improving, but has received Tylenol  -left eye doesn't appear infected but given temp,  treating for blepharitis for now pending urine and chest  -continue to monitor  Continue Rocephin for Klebsiella UTI  10/6- nursing concerned about infection- on Rocephin til 10/8- NO OTHER SIGNS of infection- WBC nml, no left shift; CXR (-).  20 . Poor initiation  9/29- cannot start Amantadine due to severely prolonged QT interval- will have to wait on med changes.  9/30 added Provigil 100 mg daily  10/1- changed to 6am so more awake for day- did help this AM  10/2- still an issue- can't start meds due to QT prolongation.  10/6- will increase to 200 mg daily. 21.  Labile blood glucose  Continue to monitor 22.  Acute blood loss anemia  Hemoglobin 12.6 on 10/5  Continue to monitor  23. Neurogenic bladder  10/7- in/out caths becoming painful- will order coude' cath q6 hours prn with lidocaine as needed  LOS: 15 days A FACE TO FACE EVALUATION WAS PERFORMED  Lynsee Wands 11/28/2018, 9:55 AM

## 2018-11-28 NOTE — Progress Notes (Signed)
Physical Therapy Session Note  Patient Details  Name: Glenn [name] MRN: [id number] Date of Birth: 1950-12-25  Today's Date: 11/28/2018 PT Individual Time: 0900-1010 PT Individual Time Calculation (min): 70 min   Short Term Goals: Week 1:  PT Short Term Goal 1 (Week 1): Pt will be able to perform bed mobility with mod assist PT Short Term Goal 1 - Progress (Week 1): Progressing toward goal PT Short Term Goal 2 (Week 1): Pt will be able to perform bed <> w/c transfers with mod assist PT Short Term Goal 2 - Progress (Week 1): Progressing toward goal PT Short Term Goal 3 (Week 1): Pt will be able to inititate gait training PT Short Term Goal 3 - Progress (Week 1): Met PT Short Term Goal 4 (Week 1): Pt will be able to propel w/c with hemi technique x 50' with min assist PT Short Term Goal 4 - Progress (Week 1): Met Week 2:  PT Short Term Goal 1 (Week 2): Pt will be able to perform bed mobility with mod assist consistently PT Short Term Goal 1 - Progress (Week 2): Progressing toward goal PT Short Term Goal 2 (Week 2): Pt will be able to perform basic transfers with mod assist consistently PT Short Term Goal 2 - Progress (Week 2): Progressing toward goal PT Short Term Goal 3 (Week 2): Pt will ambulate >10 feet using LRAD with max A PT Short Term Goal 3 - Progress (Week 2): Progressing toward goal PT Short Term Goal 4 (Week 2): Pt will be able to propel w/c with hemi technique 108' with supervision PT Short Term Goal 4 - Progress (Week 2): Progressing toward goal Week 3:  PT Short Term Goal 1 (Week 3): Pt will be able to propel w/c with hemi technique 65' with supervision PT Short Term Goal 2 (Week 3): Pt will ambulate >10 feet using LRAD with max A PT Short Term Goal 3 (Week 3): Pt will be able to perform basic transfers with mod assist consistently PT Short Term Goal 4 (Week 3): Pt will be able to perform bed mobility with mod assist consistently  Skilled Therapeutic  Interventions/Progress Updates:    Pain R uppertrap/neck at insertion , reports 13/10   Performed soft tissue mobilization and applied moist heat for comfort.  Rest breaks given as needed.  Pt initially supine in bed.  Rolls to R w/max assist.  Side to sit w/mod to max assist.  Initially leaning to L.  Worked on midline orientation using reaching activities to r side w/return to midline using visual and tactile cues to facilitate, verbal cues to promote attention to task.  Sit to stand in Steady w/mod assist, repeated high sit to stand in Steady w/mod assist.  Worked on midline orientation in standing w/max tactile cues/assist to facilitate extension L knee in standing.  Also worked on reaching to R w/return to midline, but pt limited w/this due to increasing complaints/perseveration w/L uppertrap area pain.  Performed stretching and soft tissue mobilization which provided transient relief and allowed pt to better participate in reaching.   's  Transferred to wc total assist using Steady.  wc propulsion 44f x 2 w/bilat LEs w/therapist providing max tactile cues to LLE to facilitate AROM and attention to task.  Repeated STS in standing frame total assist.  Pt better able to tolerate standing in frame w/LUE fully supported likely due to decreased traction due to LUE hemiparesis.  In standing performed bilat UE task of squeezing 8inch ball  w/hand over hand facilitation of LUE, worked on rolling ball on tray w/bilat hands same assist.  Progressed to hand over hand facilitation of stacking cones from L hand to R side of table, sliding cones from L/R w/increased proxiimal activation as session progressed.   Returned to seated in wc total assist.  wc propulsion 61f w/bilat LE's as above but increased LLE activation noted.   Performed soft tissue mobilization and stretching to insertion L upper traps which provided transient pain relief and improved ability to attend to and  pariticpate in activities.   Performed mult times during session.    Applied moist heat to upper traps at end of session, removed after 15 min.  Skin intact prior to and following rx.   Pt left seated in room w/nursing present, needs in reach, chair alarm set.       Therapy Documentation Precautions:  Precautions Precautions: Fall, ICD/Pacemaker Precaution Comments: L hemi; L inattention Restrictions Weight Bearing Restrictions: No    Therapy/Group: Individual Therapy BCallie Fielding PT  11/28/2018, 12:22 PM

## 2018-11-29 DIAGNOSIS — I63031 Cerebral infarction due to thrombosis of right carotid artery: Secondary | ICD-10-CM

## 2018-11-29 MED ORDER — MUSCLE RUB 10-15 % EX CREA
TOPICAL_CREAM | Freq: Two times a day (BID) | CUTANEOUS | Status: DC | PRN
  Filled 2018-11-29: qty 85

## 2018-11-29 MED ORDER — DIVALPROEX SODIUM 125 MG PO CSDR
125.0000 mg | DELAYED_RELEASE_CAPSULE | Freq: Two times a day (BID) | ORAL | Status: DC
  Administered 2018-11-29 – 2018-12-02 (×6): 125 mg via ORAL
  Filled 2018-11-29 (×6): qty 1

## 2018-11-29 NOTE — Progress Notes (Signed)
[place] PHYSICAL MEDICINE & REHABILITATION PROGRESS NOTE   Subjective/Complaints:  No HA, describes L sided neck pain   ROS: Limited due to cognition   Objective:   Dg Chest 2 View  Result Date: 11/27/2018 CLINICAL DATA:  Fluid overload smoker history of CHF EXAM: CHEST - 2 VIEW COMPARISON:  11/25/2018 FINDINGS: Left-sided pacing device as before. Cardiomegaly. No significant pleural effusion. Aortic atherosclerosis. Streaky atelectasis left base. IMPRESSION: Cardiomegaly without overt failure or significant pleural effusion. Streaky atelectasis left base. Electronically Signed   By: Donavan Foil M.D.   On: 11/27/2018 21:27   Recent Labs    11/28/18 0551  WBC 4.7  HGB 11.3*  HCT 33.6*  PLT 188   Recent Labs    11/28/18 0551  NA 139  K 4.3  CL 103  CO2 24  GLUCOSE 110*  BUN 16  CREATININE 1.05  CALCIUM 8.6*    Intake/Output Summary (Last 24 hours) at 11/29/2018 0757 Last data filed at 11/28/2018 1830 Gross per 24 hour  Intake 600 ml  Output 300 ml  Net 300 ml     Physical Exam: Vital Signs Blood pressure 132/83, pulse 64, temperature 99.1 F (37.3 C), temperature source Oral, resp. rate 19, height 5\' 7"  (1.702 m), weight 89.8 kg, SpO2 97 %. Constitutional: No distress . Vital signs and  nursing notes reviewed. Pt awake,  NAD HENT: Normocephalic.  Atraumatic. Eyes: EOMI. No discharge.  Left eye stye continues to improve Cardiovascular: regular rate; intermittently irregular Respiratory: Normal effort.  CTA B/L- good air movement still GI: Non-distended. NT, (+)BS, soft Skin: Warm and dry.  Intact. Psych: less awake than normal. Musc: No edema in extremities.  No tenderness in extremities. Neurological:  Alert Left facial weakness Speech mild dysarthria  Follows simple commands with cues- sometimes max cues Right upper extremity: 5/5 proximal distal Right lower extremity: 4-4+/5 proximal distal Left upper extremity: 0/5 proximal to distal Left lower  extremity: 3- HF, 2- Knee ext 0/5 Left foot  Neck - mild tenderness left cervical paraspinal  Assessment/Plan: 1. Functional deficits secondary to R MCA infarct with L hemiparesis which require 3+ hours per day of interdisciplinary therapy in a comprehensive inpatient rehab setting.  Physiatrist is providing close team supervision and 24 hour management of active medical problems listed below.  Physiatrist and rehab team continue to assess barriers to discharge/monitor patient progress toward functional and medical goals  Care Tool:  Bathing  Bathing activity did not occur: Refused Body parts bathed by patient: Left arm, Chest, Abdomen, Left upper leg, Right lower leg   Body parts bathed by helper: Right arm, Left arm, Front perineal area, Buttocks, Right upper leg, Left upper leg, Right lower leg, Left lower leg     Bathing assist Assist Level: Maximal Assistance - Patient 24 - 49%     Upper Body Dressing/Undressing Upper body dressing   What is the patient wearing?: Pull over shirt    Upper body assist Assist Level: Total Assistance - Patient < 25%    Lower Body Dressing/Undressing Lower body dressing      What is the patient wearing?: Pants, Incontinence brief     Lower body assist Assist for lower body dressing: Total Assistance - Patient < 25%     Toileting Toileting    Toileting assist Assist for toileting: Total Assistance - Patient < 25%     Transfers Chair/bed transfer  Transfers assist     Chair/bed transfer assist level: Dependent - mechanical lift  Locomotion Ambulation   Ambulation assist   Ambulation activity did not occur: Safety/medical concerns(unsafe due to inability to stand; letheray and low BP)  Assist level: 2 helpers Assistive device: Other (comment)(3 musketeer method) Max distance: 16'   Walk 10 feet activity   Assist  Walk 10 feet activity did not occur: Safety/medical concerns  Assist level: 2 helpers Assistive  device: Walker-rolling   Walk 50 feet activity   Assist Walk 50 feet with 2 turns activity did not occur: Safety/medical concerns         Walk 150 feet activity   Assist Walk 150 feet activity did not occur: Safety/medical concerns         Walk 10 feet on uneven surface  activity   Assist Walk 10 feet on uneven surfaces activity did not occur: Safety/medical concerns         Wheelchair     Assist Will patient use wheelchair at discharge?: Yes Type of Wheelchair: Manual    Wheelchair assist level: Maximal Assistance - Patient 25 - 49% Max wheelchair distance: 75    Wheelchair 50 feet with 2 turns activity    Assist        Assist Level: Maximal Assistance - Patient 25 - 49%   Wheelchair 150 feet activity     Assist      Assist Level: Dependent - Patient 0%   Blood pressure 132/83, pulse 64, temperature 99.1 F (37.3 C), temperature source Oral, resp. rate 19, height 5\' 7"  (1.702 m), weight 89.8 kg, SpO2 97 %.  1.  Left-sided weakness with facial droop and dysphagia secondary to large right MCA infarction with hemorrhagic conversion, embolic secondary to known A. fib on Eliquis as well as history of left MCA infarction 2018 status post thrombectomy for left M2 occlusion.  Anticoagulation on hold due to hemorrhage  Cont CIR  930- will try Provigil for attention- can't start Ritalin per QT prolongation nor Amantadine for same reason  10/6- will increase Provigil for AM-for attention  10/9- needs more assistance, but nothing is available.  2.  Antithrombotics: -DVT/anticoagulation: Subcutaneous heparin initiated 11/11/2018  9/25- changed to Lovenox             Monitor for bleeding             -antiplatelet therapy: N/A 3. Pain Management: Neurontin 300 mg 3 times daily,tramadol as needed Kpad and trolamine for neck pain   9/25- Added Depakote 125 mg BID will d/c and observe   10/9- will add Topamax 25 mg QHS and maintain Depakote for now.  4.  Mood: Valium 5 mg twice daily as needed anxiety             -antipsychotic agents: N/A 5. Neuropsych: This patient is not fully capable of making decisions on his own behalf. 6. Skin/Wound Care: Routine skin checks 7. Fluids/Electrolytes/Nutrition: Routine I/Os 8.  Atrial fibrillation status post ICD.  Follow cardiology services.  Patient with recent cardioversion.  Cardiac rate controlled 9.  Acute on chronic diastolic congestive heart failure.  Lasix 40 mg daily.  Monitor for any signs of fluid overload             Daily weights needed   Filed Weights   11/27/18 0636 11/28/18 0341 11/29/18 0406  Weight: 88.4 kg 87.2 kg 89.8 kg    Stable on 10/5  10/9- weight down 1+ kg- CXR looks ok- BUN/Cr great- BNP 916- will give 1 dose Lasix 40 mg x1. 10.  Nonischemic cardiomyopathy.  Ejection fraction 15 to 20%.             See #9 10/8- Weight up significantly- will make sure off IVFs, and check CXR in AM for lfuid overload.  11.  Hypertension.  Cont Coreg 12.5 mg twice daily, Aldactone 12.5 mg daily, Entresto 24-26 mg twice daily Today's Vitals   11/28/18 1327 11/28/18 1936 11/28/18 2330 11/29/18 0406  BP:  104/69  132/83  Pulse:  86  64  Resp:  18  19  Temp:  98.9 F (37.2 C)  99.1 F (37.3 C)  TempSrc:  Oral  Oral  SpO2:  97%  97%  Weight:    89.8 kg  Height:      PainSc: 0-No pain  0-No pain    Body mass index is 31.01 kg/m.;  12.  Hyperlipidemia.  Lipitor 13.  Post stroke dysphagia.  Dysphasia #1 thin liquids.  Follow-up speech therapy 14.  AKI.  Creatinine 1.23-1.54             Creatinine 1.31 on 10/5, continue to monitor  10/6- will restart IVFs NS 60cc/hr since per nursing, not drinking well  10/7- will recheck labs in AM 15.  History of tobacco/marijuana abuse as well as history of remote cocaine use.  Provide counseling 16.  Medical noncompliance.  Counseling 17.  Chronic hep C.  Follow-up outpatient 18. R trochanteric bursitis/R groin pain/Hip pain-  9/25- ordered  lidocaine patch for R lateral hip  -ROM/Ice 19. Fever:  Improving, but has received Tylenol  -left eye doesn't appear infected but given temp, treating for blepharitis for now pending urine and chest  -continue to monitor  Continue Rocephin for Klebsiella UTI  10/6- nursing concerned about infection- on Rocephin til 10/8- NO OTHER SIGNS of infection- WBC nml, no left shift; CXR (-).  20 . Poor initiation  9/29- cannot start Amantadine due to severely prolonged QT interval- will have to wait on med changes.  9/30 added Provigil 100 mg daily  10/1- changed to 6am so more awake for day- did help this AM  10/2- still an issue- can't start meds due to QT prolongation.  10/6- will increase to 200 mg daily. 21.  Labile blood glucose  Continue to monitor 22.  Acute blood loss anemia  Hemoglobin 12.6 on 10/5  Continue to monitor  23. Neurogenic bladder  10/7- in/out caths becoming painful- will order coude' cath q6 hours prn with lidocaine as needed  LOS: 16 days A FACE TO [place] E Onalee Steinbach 11/29/2018, 7:57 AM

## 2018-11-30 ENCOUNTER — Inpatient Hospital Stay (HOSPITAL_COMMUNITY): Payer: Medicare HMO | Admitting: Speech Pathology

## 2018-11-30 LAB — CULTURE, BLOOD (ROUTINE X 2)
Culture: NO GROWTH
Culture: NO GROWTH
Special Requests: ADEQUATE
Special Requests: ADEQUATE

## 2018-11-30 NOTE — Progress Notes (Signed)
[place] PHYSICAL MEDICINE & REHABILITATION PROGRESS NOTE   Subjective/Complaints: Pt drowsy this am , opens eyes to verbal stim.  No c/os   ROS: Limited due to cognition   Objective:   No results found. Recent Labs    11/28/18 0551  WBC 4.7  HGB 11.3*  HCT 33.6*  PLT 188   Recent Labs    11/28/18 0551  NA 139  K 4.3  CL 103  CO2 24  GLUCOSE 110*  BUN 16  CREATININE 1.05  CALCIUM 8.6*    Intake/Output Summary (Last 24 hours) at 11/30/2018 0840 Last data filed at 11/30/2018 0756 Gross per 24 hour  Intake 627 ml  Output 400 ml  Net 227 ml     Physical Exam: Vital Signs Blood pressure 115/76, pulse 65, temperature 99.2 F (37.3 C), temperature source Oral, resp. rate 20, height 5\' 7"  (1.702 m), weight 86.6 kg, SpO2 98 %. Constitutional: No distress . Vital signs and  nursing notes reviewed. Lethargic   NAD HENT: Normocephalic.  Atraumatic. Eyes: EOMI. No discharge.  Left eye stye continues to improve Cardiovascular: regular rate; intermittently irregular Respiratory: Normal effort.  CTA B/L- good air movement still GI: Non-distended. NT, (+)BS, soft Skin: Warm and dry.  Intact. Psych: less awake than normal. Musc: No edema in extremities.  No tenderness in extremities. Neurological:  Alert Left facial weakness Speech dysarthria  Follows simple commands with cues- sometimes max cues Right upper extremity: 5/5 proximal distal Right lower extremity: 4-4+/5 proximal distal Left upper extremity: 0/5 proximal to distal Left lower extremity: 3- HF, 2- Knee ext 0/5 Left foot  Neck - mild tenderness left cervical paraspinal  Assessment/Plan: 1. Functional deficits secondary to R MCA infarct with L hemiparesis which require 3+ hours per day of interdisciplinary therapy in a comprehensive inpatient rehab setting.  Physiatrist is providing close team supervision and 24 hour management of active medical problems listed below.  Physiatrist and rehab team  continue to assess barriers to discharge/monitor patient progress toward functional and medical goals  Care Tool:  Bathing  Bathing activity did not occur: Refused Body parts bathed by patient: Left arm, Chest, Abdomen, Left upper leg, Right lower leg   Body parts bathed by helper: Right arm, Left arm, Front perineal area, Buttocks, Right upper leg, Left upper leg, Right lower leg, Left lower leg     Bathing assist Assist Level: Maximal Assistance - Patient 24 - 49%     Upper Body Dressing/Undressing Upper body dressing   What is the patient wearing?: Pull over shirt    Upper body assist Assist Level: Total Assistance - Patient < 25%    Lower Body Dressing/Undressing Lower body dressing      What is the patient wearing?: Pants, Incontinence brief     Lower body assist Assist for lower body dressing: Total Assistance - Patient < 25%     Toileting Toileting    Toileting assist Assist for toileting: Total Assistance - Patient < 25%     Transfers Chair/bed transfer  Transfers assist     Chair/bed transfer assist level: Dependent - mechanical lift     Locomotion Ambulation   Ambulation assist   Ambulation activity did not occur: Safety/medical concerns(unsafe due to inability to stand; letheray and low BP)  Assist level: 2 helpers Assistive device: Other (comment)(3 musketeer method) Max distance: 16'   Walk 10 feet activity   Assist  Walk 10 feet activity did not occur: Safety/medical concerns  Assist level: 2 helpers  Assistive device: Walker-rolling   Walk 50 feet activity   Assist Walk 50 feet with 2 turns activity did not occur: Safety/medical concerns         Walk 150 feet activity   Assist Walk 150 feet activity did not occur: Safety/medical concerns         Walk 10 feet on uneven surface  activity   Assist Walk 10 feet on uneven surfaces activity did not occur: Safety/medical concerns         Wheelchair     Assist  Will patient use wheelchair at discharge?: Yes Type of Wheelchair: Manual    Wheelchair assist level: Maximal Assistance - Patient 25 - 49% Max wheelchair distance: 75    Wheelchair 50 feet with 2 turns activity    Assist        Assist Level: Maximal Assistance - Patient 25 - 49%   Wheelchair 150 feet activity     Assist      Assist Level: Dependent - Patient 0%   Blood pressure 115/76, pulse 65, temperature 99.2 F (37.3 C), temperature source Oral, resp. rate 20, height 5\' 7"  (1.702 m), weight 86.6 kg, SpO2 98 %.  1.  Left-sided weakness with facial droop and dysphagia secondary to large right MCA infarction with hemorrhagic conversion, embolic secondary to known A. fib on Eliquis as well as history of left MCA infarction 2018 status post thrombectomy for left M2 occlusion.  Anticoagulation on hold due to hemorrhage  Cont CIR PT, OT, SLP   930- will try Provigil for attention- can't start Ritalin per QT prolongation nor Amantadine for same reason  10/6- will increase Provigil for AM-for attention- still somnolent in ams   10/9- needs more assistance, but nothing is available.  2.  Antithrombotics: -DVT/anticoagulation: Subcutaneous heparin initiated 11/11/2018  9/25- changed to Lovenox             Monitor for bleeding             -antiplatelet therapy: N/A 3. Pain Management: Neurontin 300 mg 3 times daily,tramadol as needed Kpad and trolamine for neck pain   9/25- Added Depakote 125 mg BID will d/c and observe   10/9- will add Topamax 25 mg QHS and maintain Depakote for now.  4. Mood: Valium 5 mg twice daily as needed anxiety             -antipsychotic agents: N/A 5. Neuropsych: This patient is not fully capable of making decisions on his own behalf. 6. Skin/Wound Care: Routine skin checks 7. Fluids/Electrolytes/Nutrition: Routine I/Os 8.  Atrial fibrillation status post ICD.  Follow cardiology services.  Patient with recent cardioversion.  Cardiac rate  controlled 9.  Acute on chronic diastolic congestive heart failure.  Lasix 40 mg daily.  Monitor for any signs of fluid overload             Daily weights needed   Filed Weights   11/28/18 0341 11/29/18 0406 11/30/18 0348  Weight: 87.2 kg 89.8 kg 86.6 kg    Stable on 10/11  10/9- weight down 1+ kg- CXR looks ok- BUN/Cr great- BNP 916- will give 1 dose Lasix 40 mg x1. 10.  Nonischemic cardiomyopathy.  Ejection fraction 15 to 20%.             See #9 10/8- Weight up significantly- will make sure off IVFs, and check CXR in AM for lfuid overload.  11.  Hypertension.  Cont Coreg 12.5 mg twice daily, Aldactone 12.5 mg daily, Entresto  24-26 mg twice daily Today's Vitals   11/29/18 1915 11/29/18 2300 11/30/18 0348 11/30/18 0641  BP: 100/62  115/76   Pulse: 100  65   Resp: 18  20   Temp: 98.2 F (36.8 C)  99.2 F (37.3 C)   TempSrc: Oral  Oral   SpO2: 100%  98%   Weight:   86.6 kg   Height:      PainSc:  0-No pain  2    Body mass index is 29.9 kg/m.;  12.  Hyperlipidemia.  Lipitor 13.  Post stroke dysphagia.  Dysphasia #1 thin liquids.  Follow-up speech therapy 14.  AKI.  Creatinine 1.23-1.54             Creatinine 1.31 on 10/5, continue to monitor  10/6- will restart IVFs NS 60cc/hr since per nursing, not drinking well  10/7- will recheck labs in AM 15.  History of tobacco/marijuana abuse as well as history of remote cocaine use.  Provide counseling 16.  Medical noncompliance.  Counseling 17.  Chronic hep C.  Follow-up outpatient 18. R trochanteric bursitis/R groin pain/Hip pain-  9/25- ordered lidocaine patch for R lateral hip  -ROM/Ice 19. Fever:  Improving, but has received Tylenol  -left eye doesn't appear infected but given temp, treating for blepharitis for now pending urine and chest  -continue to monitor  Continue Rocephin for Klebsiella UTI  10/6- nursing concerned about infection- on Rocephin til 10/8- NO OTHER SIGNS of infection- WBC nml, no left shift; CXR (-).  20  . Poor initiation  9/29- cannot start Amantadine due to severely prolonged QT interval- will have to wait on med changes.  9/30 added Provigil 100 mg daily  10/1- changed to 6am so more awake for day- did help this AM  10/2- still an issue- can't start meds due to QT prolongation.  10/6- will increase to 200 mg daily. 21.  Labile blood glucose  Continue to monitor 22.  Acute blood loss anemia  Hemoglobin 12.6 on 10/5  Continue to monitor  23. Neurogenic bladder  10/7- in/out caths becoming painful- will order coude' cath q6 hours prn with lidocaine as needed  LOS: 17 days A FACE TO [place] E  11/30/2018, 8:40 AM

## 2018-11-30 NOTE — Progress Notes (Signed)
Speech Language Pathology Daily Session Note  Patient Details  Name: CARDON TREVORROW MRN: [id number] Date of Birth: 09-25-1950  Today's Date: 11/30/2018 SLP Individual Time: 0700-0740 SLP Individual Time Calculation (min): 40 min  Short Term Goals: Week 3: SLP Short Term Goal 1 (Week 3): Pt will consume current dysphagia 1 solid, thin liquid diet with min overt s/s aspiration, efficient mastication and oral clearance with Min A verbal cues for use of swallow stategies. SLP Short Term Goal 2 (Week 3): Pt will maintain alertness with Min A and demonstrate efficient mastication and oral clearance with upgraded dysphagia 2 solid trials prior to solid advancement. SLP Short Term Goal 3 (Week 3): Pt will demonstate basic problem solving during familiar functional tasks with Mod A verbal/visual cues. SLP Short Term Goal 4 (Week 3): Pt will sustain attention for ~5 minutes with Mod A cues. SLP Short Term Goal 5 (Week 3): Pt will recall daily information with Min A for use of compensatory memory strategies. SLP Short Term Goal 6 (Week 3): Pt will increase speech intelligibility to ~80% at the sentence level and Min A for use of clear speech strategies.  Skilled Therapeutic Interventions: Skilled treatment session focused on dysphagia and cognitive goals. Upon arrival, patient was asleep and slow to arouse despite Max multimodal cues. When patient was awake, he demonstrated consistent language of confusion while talking about "placing bets" and attending a church service this morning that he thought this clinician was the pastor of. SLP provided redirection to regards to orientation to time, place, and situation. Patient consumed Dys. 1 textures with thin liquids without overt s/s of aspiration but required Mod verbal cues to attend to left anterior spillage. Max verbal and visual cues were also needed for sustained attention and initiation with self-feeding. Patient handed off to NT. Continue with  current plan of care.      Pain No reports of pain  Therapy/Group: Individual Therapy  Kegan Shepardson 11/30/2018, 9:32 AM

## 2018-12-01 ENCOUNTER — Inpatient Hospital Stay (HOSPITAL_COMMUNITY): Payer: Medicare HMO

## 2018-12-01 ENCOUNTER — Inpatient Hospital Stay (HOSPITAL_COMMUNITY): Payer: Medicare HMO | Admitting: Speech Pathology

## 2018-12-01 ENCOUNTER — Ambulatory Visit: Payer: Medicare HMO | Admitting: Student

## 2018-12-01 LAB — COMPREHENSIVE METABOLIC PANEL
ALT: 14 U/L (ref 0–44)
AST: 24 U/L (ref 15–41)
Albumin: 2.6 g/dL — ABNORMAL LOW (ref 3.5–5.0)
Alkaline Phosphatase: 55 U/L (ref 38–126)
Anion gap: 10 (ref 5–15)
BUN: 11 mg/dL (ref 8–23)
CO2: 25 mmol/L (ref 22–32)
Calcium: 9 mg/dL (ref 8.9–10.3)
Chloride: 104 mmol/L (ref 98–111)
Creatinine, Ser: 1.07 mg/dL (ref 0.61–1.24)
GFR calc Af Amer: >60 mL/min (ref >60)
GFR calc non Af Amer: >60 mL/min (ref >60)
Glucose, Bld: 115 mg/dL — ABNORMAL HIGH (ref 70–99)
Potassium: 4.3 mmol/L (ref 3.5–5.1)
Sodium: 139 mmol/L (ref 135–145)
Total Bilirubin: 0.5 mg/dL (ref 0.3–1.2)
Total Protein: 6.2 g/dL — ABNORMAL LOW (ref 6.5–8.1)

## 2018-12-01 LAB — CBC WITH DIFFERENTIAL/PLATELET
Abs Immature Granulocytes: 0.02 10*3/uL (ref 0.00–0.07)
Basophils Absolute: 0 10*3/uL (ref 0.0–0.1)
Basophils Relative: 0 %
Eosinophils Absolute: 0.2 10*3/uL (ref 0.0–0.5)
Eosinophils Relative: 4 %
HCT: 33.1 % — ABNORMAL LOW (ref 39.0–52.0)
Hemoglobin: 11 g/dL — ABNORMAL LOW (ref 13.0–17.0)
Immature Granulocytes: 0 %
Lymphocytes Relative: 31 %
Lymphs Abs: 1.5 10*3/uL (ref 0.7–4.0)
MCH: 31.8 pg (ref 26.0–34.0)
MCHC: 33.2 g/dL (ref 30.0–36.0)
MCV: 95.7 fL (ref 80.0–100.0)
Monocytes Absolute: 0.6 10*3/uL (ref 0.1–1.0)
Monocytes Relative: 13 %
Neutro Abs: 2.4 10*3/uL (ref 1.7–7.7)
Neutrophils Relative %: 52 %
Platelets: 220 10*3/uL (ref 150–400)
RBC: 3.46 MIL/uL — ABNORMAL LOW (ref 4.22–5.81)
RDW: 12 % (ref 11.5–15.5)
WBC: 4.7 10*3/uL (ref 4.0–10.5)
nRBC: 0 % (ref 0.0–0.2)

## 2018-12-01 NOTE — Progress Notes (Signed)
Speech Language Pathology Daily Session Note  Patient Details  Name: Glenn [name] MRN: [id number] Date of Birth: April 13, 1950  Today's Date: 12/01/2018 SLP Individual Time: 1415-1444 SLP Individual Time Calculation (min): 29 min  Short Term Goals: Week 3: SLP Short Term Goal 1 (Week 3): Pt will consume current dysphagia 1 solid, thin liquid diet with min overt s/s aspiration, efficient mastication and oral clearance with Min A verbal cues for use of swallow stategies. SLP Short Term Goal 2 (Week 3): Pt will maintain alertness with Min A and demonstrate efficient mastication and oral clearance with upgraded dysphagia 2 solid trials prior to solid advancement. SLP Short Term Goal 3 (Week 3): Pt will demonstate basic problem solving during familiar functional tasks with Mod A verbal/visual cues. SLP Short Term Goal 4 (Week 3): Pt will sustain attention for ~5 minutes with Mod A cues. SLP Short Term Goal 5 (Week 3): Pt will recall daily information with Min A for use of compensatory memory strategies. SLP Short Term Goal 6 (Week 3): Pt will increase speech intelligibility to ~80% at the sentence level and Min A for use of clear speech strategies.  Skilled Therapeutic Interventions: Pt was seen for skilled ST targeting communication goals. Pt recalled 1 out of 3 clear speech strategies with Supervision A question cues. Min A verbal cues required to locate external aid posted in room to recite other 2 strategies. Pt demonstrated intentional use of increased vocal intensity, slow rate, and overarticulation during a sentence level descriptive barrier game with overall Mod A verbal reminders. Pt remained ~85% intelligible throughout barrier activity and informal conversation. Pt is very capable of implementing speech intelligibility strategies, however requires consistent verbal reminders to use them both during and outside of targeted speech activities. Pt was left in chair with seatbelt alarm set and  all needs within reach. Continue per current plan of care.      Pain Pain Assessment Pain Scale: Faces Pain Score: 3  Faces Pain Scale: Hurts a little bit Pain Type: Acute pain Pain Location: Leg Pain Orientation: Right;Left Pain Descriptors / Indicators: Discomfort;Spasm Pain Frequency: Intermittent Pain Onset: On-going Patients Stated Pain Goal: 0 Pain Intervention(s): Repositioned(premedicated, per chart)  Therapy/Group: Individual Therapy  Arbutus Leas 12/01/2018, 2:52 PM

## 2018-12-01 NOTE — Progress Notes (Signed)
Occupational Therapy Session Note  Patient Details  Name: Glenn [name] MRN: [id number] Date of Birth: Dec 27, 1950  Today's Date: 12/01/2018 OT Individual Time: 1300-1340 OT Individual Time Calculation (min): 40 min    Short Term Goals: Week 3:  OT Short Term Goal 1 (Week 3): Pt will don shirt with mod A OT Short Term Goal 2 (Week 3): Pt will don pants with max A sit to stand with one caregiver OT Short Term Goal 3 (Week 3): Pt will demonstrate sustained attention to ADL task with min cues. OT Short Term Goal 4 (Week 3): Pt will complete toileting tasks with max A  Skilled Therapeutic Interventions/Progress Updates:    Pt resting in w/c upon arrival.  OT intervention with focus on sit<>stand, standing balance, attention to L, attention to task, activity tolerance, and safety awareness to increase independence with BADLs.  Sit<>stand X 4 with mod A +2 and mod A+2 for standing balance.  Pt requires proprioceptive input on LLE/knee with sit<>stand and standing balance.  Pt requires max verbal cues for weight shifts to R and upright posture.  Pt unable to initiate trunk extension when standing.  Pt unable to turn head to L past midline and states his R neck hurts when attempts.  Kinesio tape applied to R SCM to inhibit and L SCM to facilitate L head turn. Pt remained in w/c with all needs within reach and belt alarm activated.   Therapy Documentation Precautions:  Precautions Precautions: Fall, ICD/Pacemaker Precaution Comments: L hemi; L inattention Restrictions Weight Bearing Restrictions: No General:   Vital Signs:   Pain: Pt reports increased pain on LLE proximal to patella when pressure applied for standing balance; repositioned and emotional support  Therapy/Group: Individual Therapy  Leroy Libman 12/01/2018, 1:45 PM

## 2018-12-01 NOTE — Progress Notes (Signed)
Speech Language Pathology Daily Session Note  Patient Details  Name: JOVEN SMEBY MRN: [id number] Date of Birth: 27-Dec-1950  Today's Date: 12/01/2018 SLP Individual Time: 1000-1058 SLP Individual Time Calculation (min): 58 min  Short Term Goals: Week 3: SLP Short Term Goal 1 (Week 3): Pt will consume current dysphagia 1 solid, thin liquid diet with min overt s/s aspiration, efficient mastication and oral clearance with Min A verbal cues for use of swallow stategies. SLP Short Term Goal 2 (Week 3): Pt will maintain alertness with Min A and demonstrate efficient mastication and oral clearance with upgraded dysphagia 2 solid trials prior to solid advancement. SLP Short Term Goal 3 (Week 3): Pt will demonstate basic problem solving during familiar functional tasks with Mod A verbal/visual cues. SLP Short Term Goal 4 (Week 3): Pt will sustain attention for ~5 minutes with Mod A cues. SLP Short Term Goal 5 (Week 3): Pt will recall daily information with Min A for use of compensatory memory strategies. SLP Short Term Goal 6 (Week 3): Pt will increase speech intelligibility to ~80% at the sentence level and Min A for use of clear speech strategies.  Skilled Therapeutic Interventions: Pt was seen for skilled ST targeting dysphagia and cognitive goals. SLP facilitated session with upgraded dysphagia 2 solid trials. Pt continues to require Mod A verbal cues to utilize swallow strategies to efficiently clear left buccal pocketing, despite his ability to verbally recite them with only Min A question cues. No overt s/s observed during intake of dysphagia 2 or thin. However, pt exhibited one instance of oral holding (of water) for approximately 2-3 minutes that required Max A verbal cueing from clinician in order for pt to initiate swallow sequence. Suspect this was due to internal distractions rather than motor deficit. Recommend continue current dysphagia 1 diet for now. During basic self-care tasks such  as washing his face and brushing his teeth at the sink, pt required Mod A verbal and tactile cues for initiation and sequencing. SLP further facilitated session with Mod-Max A verbal and visual cues during a basic 3-step action card sequencing task. Mod A verbal cueing also provided for pt to attend to visual field left of midline during structured picture sequencing task. Pt also still exhibiting fleeting moments of confusion/confabulation throughout session today. Pt was left in wheelchair with seatbelt alarm engaged and all needs within reach. Continue per current plan of care.      Pain Pain Assessment Pain Scale: Faces Pain Score: 0-No pain Faces Pain Scale: No hurt  Therapy/Group: Individual Therapy  Arbutus Leas 12/01/2018, 12:23 PM

## 2018-12-01 NOTE — Progress Notes (Signed)
Physical Therapy Session Note  Patient Details  Name: Glenn [name] MRN: [id number] Date of Birth: 1950-11-19  Today's Date: 12/01/2018 PT Individual Time: 0800-0900 PT Individual Time Calculation (min): 60 min   Short Term Goals: Week 3:  PT Short Term Goal 1 (Week 3): Pt will be able to propel w/c with hemi technique 51' with supervision PT Short Term Goal 2 (Week 3): Pt will ambulate >10 feet using LRAD with max A PT Short Term Goal 3 (Week 3): Pt will be able to perform basic transfers with mod assist consistently PT Short Term Goal 4 (Week 3): Pt will be able to perform bed mobility with mod assist consistently  Skilled Therapeutic Interventions/Progress Updates:     Patient in bed eating with NT present upon PT arrival. Patient alert and agreeable to PT session. Patient denied headache and reported that his neck pain improved this morning. Dr. Dagoberto Ligas, MD notified when she rounded during session.    Therapeutic Activity: Bed Mobility: Patient performed supine to sit with mod A for LE and trunk management in a flat bed without use of bed rails. Provided verbal cues for bringing LEs off the bed then pushing throughout his elbows to sit up. Patient sat EOB and ate 75% of his grits and drank 90% of his coffee from his breakfast tray with supervision and cues for swallowing. Required min A-close supervision for sitting balance EOB x15 min.  Transfers: Patient performed a squat pivot transfer with mod-max A from bed to w/c with cues for hand placement, foot placement, with manual facilitation, and head-hips relationship. Continues to give out half way through transfer, stating his legs get tired. He performed sit to/from stand x1 with the [facility] with min A standing from the w/c and the seat on the [place]. Provided verbal cues for hand placement, leaning forward to stand, and bringing his hips forward in standing.  PT assisted patient with bathing and dressing this morning, per patient's  request. Performed bathing in w/c with total A and UB and LB dressing with total A, including pulling pants and a new brief up in standing.  Attempted to have patient stand with RW and L hand splint, once patient was set up, he asked for his phone and requested to call his son. Attempted to redirect patient to task, stating that PT would assist him with call his son after he stood. Patient perseverated on having his phone and calling his son and was unable to be re-directed back to the task of standing. PT provided the patient with his son's number. RN in room at end of session and stated that she would assist him with calling his son.   Patient in w/c at end of session with breaks locked, seat belt alarm set, and all needs within reach.    Therapy Documentation Precautions:  Precautions Precautions: Fall, ICD/Pacemaker Precaution Comments: L hemi; L inattention Restrictions Weight Bearing Restrictions: No Pain: Pain Assessment Pain Scale: 0-10 Pain Score: 0-No pain    Therapy/Group: Individual Therapy  Sehaj Mcenroe L Kassiah Mccrory PT, DPT  12/01/2018, 12:17 PM

## 2018-12-01 NOTE — Progress Notes (Signed)
[facility] PHYSICAL MEDICINE & REHABILITATION PROGRESS NOTE   Subjective/Complaints:   Pt up, eating with PT- reports no complaints- denies having any HA x 24 hours and neck pain much better as well.    ROS: Limited due to cognition   Objective:   No results found. Recent Labs    12/01/18 0726  WBC 4.7  HGB 11.0*  HCT 33.1*  PLT 220   Recent Labs    12/01/18 0726  NA 139  K 4.3  CL 104  CO2 25  GLUCOSE 115*  BUN 11  CREATININE 1.07  CALCIUM 9.0    Intake/Output Summary (Last 24 hours) at 12/01/2018 1057 Last data filed at 12/01/2018 0908 Gross per 24 hour  Intake 480 ml  Output 900 ml  Net -420 ml     Physical Exam: Vital Signs Blood pressure 102/74, pulse 76, temperature 99.4 F (37.4 C), temperature source Oral, resp. rate 18, height 5\' 7"  (1.702 m), weight 85.1 kg, SpO2 99 %. Constitutional: No distress . Vital signs, labs and  nursing notes reviewed. Sitting up EOB eating pureed foot with PT (not SLP) assistance- per PT, much more awake, which I agree with, slightly more talkative/ answering questions, no spontaneous speech,   NAD HENT: Normocephalic.  Atraumatic. Eyes: EOMI. No discharge.  Left eye stye continues to improve Cardiovascular: regular rate; intermittently irregular Respiratory: Normal effort.  CTA B/L-  GI: Non-distended. NT, (+)BS, soft Skin: Warm and dry.  Intact. Psych: more awake, eating, sitting upright Musc: No edema in extremities.  No tenderness in extremities. Neurological:  Alert Left facial weakness Speech dysarthria  Follows simple commands with cues- sometimes max cues Right upper extremity: 5/5 proximal distal Right lower extremity: 4-4+/5 proximal distal Left upper extremity: 0/5 proximal to distal Left lower extremity: 3- HF, 2- Knee ext 0/5 Left foot  Neck - mild tenderness left cervical paraspinal - improved Assessment/Plan: 1. Functional deficits secondary to R MCA infarct with L hemiparesis which require 3+  hours per day of interdisciplinary therapy in a comprehensive inpatient rehab setting.  Physiatrist is providing close team supervision and 24 hour management of active medical problems listed below.  Physiatrist and rehab team continue to assess barriers to discharge/monitor patient progress toward functional and medical goals  Care Tool:  Bathing  Bathing activity did not occur: Refused Body parts bathed by patient: Left arm, Chest, Abdomen, Left upper leg, Right lower leg   Body parts bathed by helper: Right arm, Left arm, Front perineal area, Buttocks, Right upper leg, Left upper leg, Right lower leg, Left lower leg     Bathing assist Assist Level: Maximal Assistance - Patient 24 - 49%     Upper Body Dressing/Undressing Upper body dressing   What is the patient wearing?: Pull over shirt    Upper body assist Assist Level: Total Assistance - Patient < 25%    Lower Body Dressing/Undressing Lower body dressing      What is the patient wearing?: Pants, Incontinence brief     Lower body assist Assist for lower body dressing: Total Assistance - Patient < 25%     Toileting Toileting    Toileting assist Assist for toileting: Total Assistance - Patient < 25%     Transfers Chair/bed transfer  Transfers assist     Chair/bed transfer assist level: Dependent - mechanical lift     Locomotion Ambulation   Ambulation assist   Ambulation activity did not occur: Safety/medical concerns(unsafe due to inability to stand; letheray and low  BP)  Assist level: 2 helpers Assistive device: Other (comment)(3 musketeer method) Max distance: 16'   Walk 10 feet activity   Assist  Walk 10 feet activity did not occur: Safety/medical concerns  Assist level: 2 helpers Assistive device: Walker-rolling   Walk 50 feet activity   Assist Walk 50 feet with 2 turns activity did not occur: Safety/medical concerns         Walk 150 feet activity   Assist Walk 150 feet  activity did not occur: Safety/medical concerns         Walk 10 feet on uneven surface  activity   Assist Walk 10 feet on uneven surfaces activity did not occur: Safety/medical concerns         Wheelchair     Assist Will patient use wheelchair at discharge?: Yes Type of Wheelchair: Manual    Wheelchair assist level: Maximal Assistance - Patient 25 - 49% Max wheelchair distance: 75    Wheelchair 50 feet with 2 turns activity    Assist        Assist Level: Maximal Assistance - Patient 25 - 49%   Wheelchair 150 feet activity     Assist      Assist Level: Dependent - Patient 0%   Blood pressure 102/74, pulse 76, temperature 99.4 F (37.4 C), temperature source Oral, resp. rate 18, height 5\' 7"  (1.702 m), weight 85.1 kg, SpO2 99 %.  1.  Left-sided weakness with facial droop and dysphagia secondary to large right MCA infarction with hemorrhagic conversion, embolic secondary to known A. fib on Eliquis as well as history of left MCA infarction 2018 status post thrombectomy for left M2 occlusion.  Anticoagulation on hold due to hemorrhage  Cont CIR PT, OT, SLP   930- will try Provigil for attention- can't start Ritalin per QT prolongation nor Amantadine for same reason  10/6- will increase Provigil for AM-for attention- still somnolent in ams   10/9- needs more assistance, but nothing is available.  2.  Antithrombotics: -DVT/anticoagulation: Subcutaneous heparin initiated 11/11/2018  9/25- changed to Lovenox             Monitor for bleeding             -antiplatelet therapy: N/A 3. Pain Management: Neurontin 300 mg 3 times daily,tramadol as needed Kpad and trolamine for neck pain   9/25- Added Depakote 125 mg BID will d/c and observe   10/9- will add Topamax 25 mg QHS and maintain Depakote for now.   10/12- HA gone x 24 hours 4. Mood: Valium 5 mg twice daily as needed anxiety             -antipsychotic agents: N/A 5. Neuropsych: This patient is not fully  capable of making decisions on his own behalf. 6. Skin/Wound Care: Routine skin checks 7. Fluids/Electrolytes/Nutrition: Routine I/Os 8.  Atrial fibrillation status post ICD.  Follow cardiology services.  Patient with recent cardioversion.  Cardiac rate controlled 9.  Acute on chronic diastolic congestive heart failure.  Lasix 40 mg daily.  Monitor for any signs of fluid overload             Daily weights needed   Filed Weights   11/29/18 0406 11/30/18 0348 12/01/18 0500  Weight: 89.8 kg 86.6 kg 85.1 kg    Stable on 10/11  10/9- weight down 1+ kg- CXR looks ok- BUN/Cr great- BNP 916- will give 1 dose Lasix 40 mg x1. 10.  Nonischemic cardiomyopathy.  Ejection fraction 15 to 20%.  See #9 10/8- Weight up significantly- will make sure off IVFs, and check CXR in AM for lfuid overload.  11.  Hypertension.  Cont Coreg 12.5 mg twice daily, Aldactone 12.5 mg daily, Entresto 24-26 mg twice daily Today's Vitals   12/01/18 0347 12/01/18 0500 12/01/18 0725 12/01/18 0908  BP: 102/74     Pulse: (!) 44  76   Resp: 18     Temp: 99.4 F (37.4 C)     TempSrc: Oral     SpO2: 99%     Weight:  85.1 kg    Height:      PainSc:    0-No pain   Body mass index is 29.38 kg/m.;  12.  Hyperlipidemia.  Lipitor 13.  Post stroke dysphagia.  Dysphasia #1 thin liquids.  Follow-up speech therapy 14.  AKI.  Creatinine 1.23-1.54             Creatinine 1.31 on 10/5, continue to monitor  10/6- will restart IVFs NS 60cc/hr since per nursing, not drinking well  10/7- will recheck labs in AM  10/12- Cr 1.07 and BUN great at 11- no more IVFs needed right now. 15.  History of tobacco/marijuana abuse as well as history of remote cocaine use.  Provide counseling 16.  Medical noncompliance.  Counseling 17.  Chronic hep C.  Follow-up outpatient 18. R trochanteric bursitis/R groin pain/Hip pain-  9/25- ordered lidocaine patch for R lateral hip  -ROM/Ice 19. Fever:  Improving, but has received Tylenol  -left  eye doesn't appear infected but given temp, treating for blepharitis for now pending urine and chest  -continue to monitor  Continue Rocephin for Klebsiella UTI  10/6- nursing concerned about infection- on Rocephin til 10/8- NO OTHER SIGNS of infection- WBC nml, no left shift; CXR (-).  20 . Poor initiation  9/29- cannot start Amantadine due to severely prolonged QT interval- will have to wait on med changes.  9/30 added Provigil 100 mg daily  10/1- changed to 6am so more awake for day- did help this AM  10/2- still an issue- can't start meds due to QT prolongation.  10/6- will increase to 200 mg daily. 21.  Labile blood glucose  Continue to monitor 22.  Acute blood loss anemia  Hemoglobin 12.6 on 10/5  Continue to monitor  23. Neurogenic bladder  10/7- in/out caths becoming painful- will order coude' cath q6 hours prn with lidocaine as needed  LOS: 18 days A FACE TO FACE EVALUATION WAS PERFORMED  Jony Ladnier 12/01/2018, 10:57 AM

## 2018-12-02 ENCOUNTER — Inpatient Hospital Stay (HOSPITAL_COMMUNITY)
Admission: AD | Admit: 2018-12-02 | Discharge: 2018-12-24 | DRG: 871 | Disposition: A | Discharge status: Skilled Nursing Facility | Payer: Medicare HMO | Source: Ambulatory Visit | Attending: Family Medicine | Admitting: Family Medicine

## 2018-12-02 ENCOUNTER — Inpatient Hospital Stay (HOSPITAL_COMMUNITY): Payer: Medicare HMO

## 2018-12-02 ENCOUNTER — Inpatient Hospital Stay (HOSPITAL_COMMUNITY): Payer: Medicare HMO | Admitting: Speech Pathology

## 2018-12-02 DIAGNOSIS — I82442 Acute embolism and thrombosis of left tibial vein: Secondary | ICD-10-CM | POA: Diagnosis present

## 2018-12-02 DIAGNOSIS — I82453 Acute embolism and thrombosis of peroneal vein, bilateral: Secondary | ICD-10-CM | POA: Diagnosis not present

## 2018-12-02 DIAGNOSIS — M7989 Other specified soft tissue disorders: Secondary | ICD-10-CM | POA: Diagnosis not present

## 2018-12-02 DIAGNOSIS — M6281 Muscle weakness (generalized): Secondary | ICD-10-CM | POA: Diagnosis not present

## 2018-12-02 DIAGNOSIS — M2550 Pain in unspecified joint: Secondary | ICD-10-CM | POA: Diagnosis not present

## 2018-12-02 DIAGNOSIS — I82452 Acute embolism and thrombosis of left peroneal vein: Secondary | ICD-10-CM | POA: Diagnosis present

## 2018-12-02 DIAGNOSIS — I82413 Acute embolism and thrombosis of femoral vein, bilateral: Secondary | ICD-10-CM | POA: Diagnosis not present

## 2018-12-02 DIAGNOSIS — M25551 Pain in right hip: Secondary | ICD-10-CM | POA: Diagnosis not present

## 2018-12-02 DIAGNOSIS — E440 Moderate protein-calorie malnutrition: Secondary | ICD-10-CM | POA: Diagnosis present

## 2018-12-02 DIAGNOSIS — R1011 Right upper quadrant pain: Secondary | ICD-10-CM | POA: Diagnosis not present

## 2018-12-02 DIAGNOSIS — R6521 Severe sepsis with septic shock: Secondary | ICD-10-CM | POA: Diagnosis not present

## 2018-12-02 DIAGNOSIS — J9621 Acute and chronic respiratory failure with hypoxia: Secondary | ICD-10-CM | POA: Diagnosis present

## 2018-12-02 DIAGNOSIS — B182 Chronic viral hepatitis C: Secondary | ICD-10-CM | POA: Diagnosis not present

## 2018-12-02 DIAGNOSIS — K8000 Calculus of gallbladder with acute cholecystitis without obstruction: Secondary | ICD-10-CM | POA: Diagnosis present

## 2018-12-02 DIAGNOSIS — Z79899 Other long term (current) drug therapy: Secondary | ICD-10-CM

## 2018-12-02 DIAGNOSIS — I82412 Acute embolism and thrombosis of left femoral vein: Secondary | ICD-10-CM | POA: Diagnosis present

## 2018-12-02 DIAGNOSIS — R0602 Shortness of breath: Secondary | ICD-10-CM | POA: Diagnosis not present

## 2018-12-02 DIAGNOSIS — D62 Acute posthemorrhagic anemia: Secondary | ICD-10-CM | POA: Diagnosis not present

## 2018-12-02 DIAGNOSIS — R609 Edema, unspecified: Secondary | ICD-10-CM | POA: Diagnosis not present

## 2018-12-02 DIAGNOSIS — I69191 Dysphagia following nontraumatic intracerebral hemorrhage: Secondary | ICD-10-CM | POA: Diagnosis not present

## 2018-12-02 DIAGNOSIS — Z888 Allergy status to other drugs, medicaments and biological substances status: Secondary | ICD-10-CM

## 2018-12-02 DIAGNOSIS — R918 Other nonspecific abnormal finding of lung field: Secondary | ICD-10-CM | POA: Diagnosis not present

## 2018-12-02 DIAGNOSIS — I6930 Unspecified sequelae of cerebral infarction: Secondary | ICD-10-CM | POA: Diagnosis not present

## 2018-12-02 DIAGNOSIS — E785 Hyperlipidemia, unspecified: Secondary | ICD-10-CM | POA: Diagnosis present

## 2018-12-02 DIAGNOSIS — M25552 Pain in left hip: Secondary | ICD-10-CM | POA: Diagnosis not present

## 2018-12-02 DIAGNOSIS — I5022 Chronic systolic (congestive) heart failure: Secondary | ICD-10-CM | POA: Diagnosis not present

## 2018-12-02 DIAGNOSIS — R2689 Other abnormalities of gait and mobility: Secondary | ICD-10-CM | POA: Diagnosis not present

## 2018-12-02 DIAGNOSIS — R140 Abdominal distension (gaseous): Secondary | ICD-10-CM | POA: Diagnosis not present

## 2018-12-02 DIAGNOSIS — N179 Acute kidney failure, unspecified: Secondary | ICD-10-CM | POA: Diagnosis not present

## 2018-12-02 DIAGNOSIS — Z95810 Presence of automatic (implantable) cardiac defibrillator: Secondary | ICD-10-CM

## 2018-12-02 DIAGNOSIS — F17210 Nicotine dependence, cigarettes, uncomplicated: Secondary | ICD-10-CM | POA: Diagnosis present

## 2018-12-02 DIAGNOSIS — I472 Ventricular tachycardia: Secondary | ICD-10-CM | POA: Diagnosis not present

## 2018-12-02 DIAGNOSIS — I5033 Acute on chronic diastolic (congestive) heart failure: Secondary | ICD-10-CM | POA: Diagnosis not present

## 2018-12-02 DIAGNOSIS — I69354 Hemiplegia and hemiparesis following cerebral infarction affecting left non-dominant side: Secondary | ICD-10-CM | POA: Diagnosis not present

## 2018-12-02 DIAGNOSIS — I5020 Unspecified systolic (congestive) heart failure: Secondary | ICD-10-CM | POA: Diagnosis not present

## 2018-12-02 DIAGNOSIS — I63031 Cerebral infarction due to thrombosis of right carotid artery: Secondary | ICD-10-CM | POA: Diagnosis not present

## 2018-12-02 DIAGNOSIS — I130 Hypertensive heart and chronic kidney disease with heart failure and stage 1 through stage 4 chronic kidney disease, or unspecified chronic kidney disease: Secondary | ICD-10-CM | POA: Diagnosis not present

## 2018-12-02 DIAGNOSIS — I69392 Facial weakness following cerebral infarction: Secondary | ICD-10-CM | POA: Diagnosis not present

## 2018-12-02 DIAGNOSIS — J9690 Respiratory failure, unspecified, unspecified whether with hypoxia or hypercapnia: Secondary | ICD-10-CM | POA: Diagnosis not present

## 2018-12-02 DIAGNOSIS — Z20828 Contact with and (suspected) exposure to other viral communicable diseases: Secondary | ICD-10-CM | POA: Diagnosis not present

## 2018-12-02 DIAGNOSIS — B1920 Unspecified viral hepatitis C without hepatic coma: Secondary | ICD-10-CM | POA: Diagnosis present

## 2018-12-02 DIAGNOSIS — I4819 Other persistent atrial fibrillation: Secondary | ICD-10-CM | POA: Diagnosis not present

## 2018-12-02 DIAGNOSIS — Z4659 Encounter for fitting and adjustment of other gastrointestinal appliance and device: Secondary | ICD-10-CM

## 2018-12-02 DIAGNOSIS — R042 Hemoptysis: Secondary | ICD-10-CM

## 2018-12-02 DIAGNOSIS — I4891 Unspecified atrial fibrillation: Secondary | ICD-10-CM | POA: Diagnosis not present

## 2018-12-02 DIAGNOSIS — R2681 Unsteadiness on feet: Secondary | ICD-10-CM | POA: Diagnosis not present

## 2018-12-02 DIAGNOSIS — I361 Nonrheumatic tricuspid (valve) insufficiency: Secondary | ICD-10-CM | POA: Diagnosis not present

## 2018-12-02 DIAGNOSIS — R279 Unspecified lack of coordination: Secondary | ICD-10-CM | POA: Diagnosis not present

## 2018-12-02 DIAGNOSIS — I959 Hypotension, unspecified: Secondary | ICD-10-CM | POA: Diagnosis not present

## 2018-12-02 DIAGNOSIS — R10811 Right upper quadrant abdominal tenderness: Secondary | ICD-10-CM

## 2018-12-02 DIAGNOSIS — I340 Nonrheumatic mitral (valve) insufficiency: Secondary | ICD-10-CM | POA: Diagnosis not present

## 2018-12-02 DIAGNOSIS — I493 Ventricular premature depolarization: Secondary | ICD-10-CM | POA: Diagnosis not present

## 2018-12-02 DIAGNOSIS — K810 Acute cholecystitis: Secondary | ICD-10-CM | POA: Diagnosis not present

## 2018-12-02 DIAGNOSIS — I69359 Hemiplegia and hemiparesis following cerebral infarction affecting unspecified side: Secondary | ICD-10-CM | POA: Diagnosis not present

## 2018-12-02 DIAGNOSIS — I69391 Dysphagia following cerebral infarction: Secondary | ICD-10-CM

## 2018-12-02 DIAGNOSIS — I4811 Longstanding persistent atrial fibrillation: Secondary | ICD-10-CM | POA: Diagnosis not present

## 2018-12-02 DIAGNOSIS — G939 Disorder of brain, unspecified: Secondary | ICD-10-CM | POA: Diagnosis not present

## 2018-12-02 DIAGNOSIS — I824Z3 Acute embolism and thrombosis of unspecified deep veins of distal lower extremity, bilateral: Secondary | ICD-10-CM | POA: Diagnosis not present

## 2018-12-02 DIAGNOSIS — I69122 Dysarthria following nontraumatic intracerebral hemorrhage: Secondary | ICD-10-CM | POA: Diagnosis not present

## 2018-12-02 DIAGNOSIS — N1831 Chronic kidney disease, stage 3a: Secondary | ICD-10-CM | POA: Diagnosis present

## 2018-12-02 DIAGNOSIS — Z6827 Body mass index (BMI) 27.0-27.9, adult: Secondary | ICD-10-CM

## 2018-12-02 DIAGNOSIS — I82403 Acute embolism and thrombosis of unspecified deep veins of lower extremity, bilateral: Secondary | ICD-10-CM | POA: Diagnosis not present

## 2018-12-02 DIAGNOSIS — I428 Other cardiomyopathies: Secondary | ICD-10-CM | POA: Diagnosis present

## 2018-12-02 DIAGNOSIS — Z7901 Long term (current) use of anticoagulants: Secondary | ICD-10-CM

## 2018-12-02 DIAGNOSIS — I5042 Chronic combined systolic (congestive) and diastolic (congestive) heart failure: Secondary | ICD-10-CM | POA: Diagnosis not present

## 2018-12-02 DIAGNOSIS — N319 Neuromuscular dysfunction of bladder, unspecified: Secondary | ICD-10-CM | POA: Diagnosis present

## 2018-12-02 DIAGNOSIS — R1312 Dysphagia, oropharyngeal phase: Secondary | ICD-10-CM | POA: Diagnosis not present

## 2018-12-02 DIAGNOSIS — A419 Sepsis, unspecified organism: Principal | ICD-10-CM | POA: Diagnosis present

## 2018-12-02 DIAGNOSIS — J90 Pleural effusion, not elsewhere classified: Secondary | ICD-10-CM | POA: Diagnosis not present

## 2018-12-02 DIAGNOSIS — I509 Heart failure, unspecified: Secondary | ICD-10-CM | POA: Diagnosis not present

## 2018-12-02 DIAGNOSIS — J181 Lobar pneumonia, unspecified organism: Secondary | ICD-10-CM | POA: Diagnosis not present

## 2018-12-02 DIAGNOSIS — I255 Ischemic cardiomyopathy: Secondary | ICD-10-CM | POA: Diagnosis not present

## 2018-12-02 DIAGNOSIS — R509 Fever, unspecified: Secondary | ICD-10-CM | POA: Diagnosis not present

## 2018-12-02 DIAGNOSIS — I5023 Acute on chronic systolic (congestive) heart failure: Secondary | ICD-10-CM | POA: Diagnosis not present

## 2018-12-02 DIAGNOSIS — Z4682 Encounter for fitting and adjustment of non-vascular catheter: Secondary | ICD-10-CM | POA: Diagnosis not present

## 2018-12-02 DIAGNOSIS — R109 Unspecified abdominal pain: Secondary | ICD-10-CM | POA: Diagnosis not present

## 2018-12-02 DIAGNOSIS — I82432 Acute embolism and thrombosis of left popliteal vein: Secondary | ICD-10-CM | POA: Diagnosis present

## 2018-12-02 DIAGNOSIS — D631 Anemia in chronic kidney disease: Secondary | ICD-10-CM | POA: Diagnosis present

## 2018-12-02 DIAGNOSIS — I5043 Acute on chronic combined systolic (congestive) and diastolic (congestive) heart failure: Secondary | ICD-10-CM | POA: Diagnosis not present

## 2018-12-02 DIAGNOSIS — J690 Pneumonitis due to inhalation of food and vomit: Secondary | ICD-10-CM | POA: Diagnosis present

## 2018-12-02 DIAGNOSIS — Z9981 Dependence on supplemental oxygen: Secondary | ICD-10-CM

## 2018-12-02 DIAGNOSIS — R6520 Severe sepsis without septic shock: Secondary | ICD-10-CM | POA: Diagnosis not present

## 2018-12-02 DIAGNOSIS — Z7401 Bed confinement status: Secondary | ICD-10-CM | POA: Diagnosis not present

## 2018-12-02 DIAGNOSIS — R519 Headache, unspecified: Secondary | ICD-10-CM | POA: Diagnosis not present

## 2018-12-02 DIAGNOSIS — I69115 Cognitive social or emotional deficit following nontraumatic intracerebral hemorrhage: Secondary | ICD-10-CM | POA: Diagnosis not present

## 2018-12-02 DIAGNOSIS — F1290 Cannabis use, unspecified, uncomplicated: Secondary | ICD-10-CM | POA: Diagnosis present

## 2018-12-02 DIAGNOSIS — K8020 Calculus of gallbladder without cholecystitis without obstruction: Secondary | ICD-10-CM | POA: Diagnosis not present

## 2018-12-02 DIAGNOSIS — Z8673 Personal history of transient ischemic attack (TIA), and cerebral infarction without residual deficits: Secondary | ICD-10-CM | POA: Diagnosis not present

## 2018-12-02 DIAGNOSIS — J9811 Atelectasis: Secondary | ICD-10-CM | POA: Diagnosis not present

## 2018-12-02 DIAGNOSIS — Z823 Family history of stroke: Secondary | ICD-10-CM

## 2018-12-02 DIAGNOSIS — I82402 Acute embolism and thrombosis of unspecified deep veins of left lower extremity: Secondary | ICD-10-CM | POA: Diagnosis not present

## 2018-12-02 DIAGNOSIS — E876 Hypokalemia: Secondary | ICD-10-CM | POA: Diagnosis not present

## 2018-12-02 DIAGNOSIS — I10 Essential (primary) hypertension: Secondary | ICD-10-CM | POA: Diagnosis not present

## 2018-12-02 DIAGNOSIS — J9601 Acute respiratory failure with hypoxia: Secondary | ICD-10-CM | POA: Diagnosis not present

## 2018-12-02 DIAGNOSIS — G8929 Other chronic pain: Secondary | ICD-10-CM | POA: Diagnosis not present

## 2018-12-02 DIAGNOSIS — R0682 Tachypnea, not elsewhere classified: Secondary | ICD-10-CM | POA: Diagnosis not present

## 2018-12-02 DIAGNOSIS — K828 Other specified diseases of gallbladder: Secondary | ICD-10-CM | POA: Diagnosis not present

## 2018-12-02 DIAGNOSIS — I639 Cerebral infarction, unspecified: Secondary | ICD-10-CM | POA: Diagnosis not present

## 2018-12-02 DIAGNOSIS — I63511 Cerebral infarction due to unspecified occlusion or stenosis of right middle cerebral artery: Secondary | ICD-10-CM | POA: Diagnosis not present

## 2018-12-02 DIAGNOSIS — I5021 Acute systolic (congestive) heart failure: Secondary | ICD-10-CM | POA: Diagnosis not present

## 2018-12-02 DIAGNOSIS — R1310 Dysphagia, unspecified: Secondary | ICD-10-CM | POA: Diagnosis present

## 2018-12-02 DIAGNOSIS — I82409 Acute embolism and thrombosis of unspecified deep veins of unspecified lower extremity: Secondary | ICD-10-CM

## 2018-12-02 DIAGNOSIS — I619 Nontraumatic intracerebral hemorrhage, unspecified: Secondary | ICD-10-CM | POA: Diagnosis not present

## 2018-12-02 DIAGNOSIS — Z7189 Other specified counseling: Secondary | ICD-10-CM | POA: Diagnosis not present

## 2018-12-02 LAB — TROPONIN I (HIGH SENSITIVITY): Troponin I (High Sensitivity): 26 ng/L — ABNORMAL HIGH (ref <18)

## 2018-12-02 LAB — COMPREHENSIVE METABOLIC PANEL
ALT: 16 U/L (ref 0–44)
ALT: 16 U/L (ref 0–44)
AST: 25 U/L (ref 15–41)
AST: 26 U/L (ref 15–41)
Albumin: 3.1 g/dL — ABNORMAL LOW (ref 3.5–5.0)
Albumin: 2.7 g/dL — ABNORMAL LOW (ref 3.5–5.0)
Alkaline Phosphatase: 68 U/L (ref 38–126)
Alkaline Phosphatase: 61 U/L (ref 38–126)
Anion gap: 12 (ref 5–15)
Anion gap: 11 (ref 5–15)
BUN: 15 mg/dL (ref 8–23)
BUN: 17 mg/dL (ref 8–23)
CO2: 23 mmol/L (ref 22–32)
CO2: 22 mmol/L (ref 22–32)
Calcium: 9.2 mg/dL (ref 8.9–10.3)
Calcium: 8.5 mg/dL — ABNORMAL LOW (ref 8.9–10.3)
Chloride: 102 mmol/L (ref 98–111)
Chloride: 103 mmol/L (ref 98–111)
Creatinine, Ser: 1.39 mg/dL — ABNORMAL HIGH (ref 0.61–1.24)
Creatinine, Ser: 1.66 mg/dL — ABNORMAL HIGH (ref 0.61–1.24)
GFR calc Af Amer: >60 mL/min (ref >60)
GFR calc Af Amer: 49 mL/min — ABNORMAL LOW (ref >60)
GFR calc non Af Amer: 52 mL/min — ABNORMAL LOW (ref >60)
GFR calc non Af Amer: 42 mL/min — ABNORMAL LOW (ref >60)
Glucose, Bld: 149 mg/dL — ABNORMAL HIGH (ref 70–99)
Glucose, Bld: 136 mg/dL — ABNORMAL HIGH (ref 70–99)
Potassium: 4.9 mmol/L (ref 3.5–5.1)
Potassium: 6.1 mmol/L — ABNORMAL HIGH (ref 3.5–5.1)
Sodium: 137 mmol/L (ref 135–145)
Sodium: 136 mmol/L (ref 135–145)
Total Bilirubin: 0.8 mg/dL (ref 0.3–1.2)
Total Bilirubin: 0.8 mg/dL (ref 0.3–1.2)
Total Protein: 7.4 g/dL (ref 6.5–8.1)
Total Protein: 6.5 g/dL (ref 6.5–8.1)

## 2018-12-02 LAB — URINALYSIS, ROUTINE W REFLEX MICROSCOPIC
Bilirubin Urine: NEGATIVE
Glucose, UA: NEGATIVE mg/dL
Hgb urine dipstick: NEGATIVE
Ketones, ur: 5 mg/dL — AB
Leukocytes,Ua: NEGATIVE
Nitrite: NEGATIVE
Protein, ur: 30 mg/dL — AB
Specific Gravity, Urine: 1.023 (ref 1.005–1.030)
pH: 5 (ref 5.0–8.0)

## 2018-12-02 LAB — BLOOD GAS, ARTERIAL
Acid-base deficit: 3 mmol/L — ABNORMAL HIGH (ref 0.0–2.0)
Bicarbonate: 20.5 mmol/L (ref 20.0–28.0)
Drawn by: 56002
FIO2: 21
O2 Saturation: 92.8 %
Patient temperature: 99
pCO2 arterial: 31.1 mmHg — ABNORMAL LOW (ref 32.0–48.0)
pH, Arterial: 7.435 (ref 7.350–7.450)
pO2, Arterial: 69.8 mmHg — ABNORMAL LOW (ref 83.0–108.0)

## 2018-12-02 LAB — CBC WITH DIFFERENTIAL/PLATELET
Abs Immature Granulocytes: 0.12 10*3/uL — ABNORMAL HIGH (ref 0.00–0.07)
Basophils Absolute: 0 10*3/uL (ref 0.0–0.1)
Basophils Relative: 0 %
Eosinophils Absolute: 0 10*3/uL (ref 0.0–0.5)
Eosinophils Relative: 0 %
HCT: 36.1 % — ABNORMAL LOW (ref 39.0–52.0)
Hemoglobin: 12.3 g/dL — ABNORMAL LOW (ref 13.0–17.0)
Immature Granulocytes: 1 %
Lymphocytes Relative: 8 %
Lymphs Abs: 1.2 10*3/uL (ref 0.7–4.0)
MCH: 32.2 pg (ref 26.0–34.0)
MCHC: 34.1 g/dL (ref 30.0–36.0)
MCV: 94.5 fL (ref 80.0–100.0)
Monocytes Absolute: 1.9 10*3/uL — ABNORMAL HIGH (ref 0.1–1.0)
Monocytes Relative: 12 %
Neutro Abs: 12.3 10*3/uL — ABNORMAL HIGH (ref 1.7–7.7)
Neutrophils Relative %: 79 %
Platelets: 278 10*3/uL (ref 150–400)
RBC: 3.82 MIL/uL — ABNORMAL LOW (ref 4.22–5.81)
RDW: 12.5 % (ref 11.5–15.5)
WBC: 15.6 10*3/uL — ABNORMAL HIGH (ref 4.0–10.5)
nRBC: 0 % (ref 0.0–0.2)

## 2018-12-02 LAB — CBC
HCT: 38 % — ABNORMAL LOW (ref 39.0–52.0)
Hemoglobin: 12.6 g/dL — ABNORMAL LOW (ref 13.0–17.0)
MCH: 31.7 pg (ref 26.0–34.0)
MCHC: 33.2 g/dL (ref 30.0–36.0)
MCV: 95.7 fL (ref 80.0–100.0)
Platelets: 276 10*3/uL (ref 150–400)
RBC: 3.97 MIL/uL — ABNORMAL LOW (ref 4.22–5.81)
RDW: 12.1 % (ref 11.5–15.5)
WBC: 11.3 10*3/uL — ABNORMAL HIGH (ref 4.0–10.5)
nRBC: 0 % (ref 0.0–0.2)

## 2018-12-02 LAB — BRAIN NATRIURETIC PEPTIDE: B Natriuretic Peptide: 566.2 pg/mL — ABNORMAL HIGH (ref 0.0–100.0)

## 2018-12-02 LAB — POCT I-STAT 7, (LYTES, BLD GAS, ICA,H+H)
Acid-base deficit: 1 mmol/L (ref 0.0–2.0)
Bicarbonate: 23.8 mmol/L (ref 20.0–28.0)
Calcium, Ion: 1.18 mmol/L (ref 1.15–1.40)
HCT: 32 % — ABNORMAL LOW (ref 39.0–52.0)
Hemoglobin: 10.9 g/dL — ABNORMAL LOW (ref 13.0–17.0)
O2 Saturation: 100 %
Patient temperature: 99
Potassium: 5 mmol/L (ref 3.5–5.1)
Sodium: 137 mmol/L (ref 135–145)
TCO2: 25 mmol/L (ref 22–32)
pCO2 arterial: 38.3 mmHg (ref 32.0–48.0)
pH, Arterial: 7.402 (ref 7.350–7.450)
pO2, Arterial: 447 mmHg — ABNORMAL HIGH (ref 83.0–108.0)

## 2018-12-02 LAB — PROTIME-INR
INR: 1.4 — ABNORMAL HIGH (ref 0.8–1.2)
Prothrombin Time: 16.8 seconds — ABNORMAL HIGH (ref 11.4–15.2)

## 2018-12-02 LAB — AMMONIA: Ammonia: 23 umol/L (ref 9–35)

## 2018-12-02 LAB — PROCALCITONIN: Procalcitonin: 0.17 ng/mL

## 2018-12-02 LAB — APTT: aPTT: 31 seconds (ref 24–36)

## 2018-12-02 LAB — LACTIC ACID, PLASMA
Lactic Acid, Venous: 1.2 mmol/L (ref 0.5–1.9)
Lactic Acid, Venous: 1.5 mmol/L (ref 0.5–1.9)

## 2018-12-02 MED ORDER — MORPHINE SULFATE (PF) 2 MG/ML IV SOLN: 2.0000 mg | INTRAVENOUS | Status: DC

## 2018-12-02 MED ORDER — DILTIAZEM HCL-DEXTROSE 125-5 MG/125ML-% IV SOLN (PREMIX)
5.0000 mg/h | INTRAVENOUS | Status: DC
  Administered 2018-12-02: 21:00:00 5 mg/h via INTRAVENOUS
  Administered 2018-12-03 – 2018-12-04 (×5): 15 mg/h via INTRAVENOUS
  Filled 2018-12-02 (×7): qty 125

## 2018-12-02 MED ORDER — METOPROLOL TARTRATE 5 MG/5ML IV SOLN
5.0000 mg | INTRAVENOUS | Status: AC
  Administered 2018-12-02: 5 mg via INTRAVENOUS
  Filled 2018-12-02: qty 5

## 2018-12-02 MED ORDER — LEVALBUTEROL HCL 0.63 MG/3ML IN NEBU
INHALATION_SOLUTION | RESPIRATORY_TRACT | Status: AC
  Filled 2018-12-02: qty 3

## 2018-12-02 MED ORDER — FENTANYL CITRATE (PF) 100 MCG/2ML IJ SOLN
INTRAMUSCULAR | Status: AC
  Filled 2018-12-02: qty 2

## 2018-12-02 MED ORDER — TRAMADOL HCL 50 MG PO TABS: 50.0000 mg | ORAL_TABLET | Freq: Three times a day (TID) | ORAL | Status: DC | PRN

## 2018-12-02 MED ORDER — IPRATROPIUM BROMIDE 0.02 % IN SOLN
0.5000 mg | Freq: Four times a day (QID) | RESPIRATORY_TRACT | Status: DC
  Administered 2018-12-03 – 2018-12-10 (×28): 0.5 mg via RESPIRATORY_TRACT
  Filled 2018-12-02 (×29): qty 2.5

## 2018-12-02 MED ORDER — VANCOMYCIN HCL 10 G IV SOLR
1500.0000 mg | INTRAVENOUS | Status: DC
  Filled 2018-12-02: qty 1500

## 2018-12-02 MED ORDER — LIDOCAINE 5 % EX PTCH: 1.0000 | MEDICATED_PATCH | CUTANEOUS | 0 refills | Status: DC

## 2018-12-02 MED ORDER — LEVALBUTEROL HCL 0.63 MG/3ML IN NEBU
INHALATION_SOLUTION | RESPIRATORY_TRACT | Status: AC
  Administered 2018-12-02: 0.63 mg via RESPIRATORY_TRACT
  Filled 2018-12-02: qty 3

## 2018-12-02 MED ORDER — VANCOMYCIN HCL 10 G IV SOLR
1750.0000 mg | Freq: Once | INTRAVENOUS | Status: AC
  Administered 2018-12-02: 21:00:00 1750 mg via INTRAVENOUS
  Filled 2018-12-02: qty 1750

## 2018-12-02 MED ORDER — LIDOCAINE 4 % EX CREA: TOPICAL_CREAM | Freq: Three times a day (TID) | CUTANEOUS | 0 refills | Status: DC

## 2018-12-02 MED ORDER — SPIRONOLACTONE 25 MG PO TABS: 12.5000 mg | ORAL_TABLET | Freq: Every day | ORAL | Status: DC

## 2018-12-02 MED ORDER — METOPROLOL TARTRATE 5 MG/5ML IV SOLN
INTRAVENOUS | Status: AC
  Administered 2018-12-02: 5 mg
  Filled 2018-12-02: qty 5

## 2018-12-02 MED ORDER — ENOXAPARIN SODIUM 40 MG/0.4ML ~~LOC~~ SOLN
40.0000 mg | SUBCUTANEOUS | Status: DC
  Administered 2018-12-03: 09:00:00 40 mg via SUBCUTANEOUS
  Filled 2018-12-02: qty 0.4

## 2018-12-02 MED ORDER — MORPHINE SULFATE (PF) 2 MG/ML IV SOLN
2.0000 mg | INTRAVENOUS | Status: AC
  Administered 2018-12-02: 2 mg via INTRAVENOUS
  Filled 2018-12-02: qty 1

## 2018-12-02 MED ORDER — FENTANYL CITRATE (PF) 100 MCG/2ML IJ SOLN
50.0000 ug | Freq: Once | INTRAMUSCULAR | Status: AC
  Administered 2018-12-02: 20:00:00 50 ug via INTRAVENOUS

## 2018-12-02 MED ORDER — CARVEDILOL 12.5 MG PO TABS: 12.5000 mg | ORAL_TABLET | Freq: Two times a day (BID) | ORAL | Status: DC

## 2018-12-02 MED ORDER — CHLORHEXIDINE GLUCONATE CLOTH 2 % EX PADS
6.0000 | MEDICATED_PAD | Freq: Every day | CUTANEOUS | Status: DC
  Administered 2018-12-02 – 2018-12-23 (×18): 6 via TOPICAL

## 2018-12-02 MED ORDER — PIPERACILLIN-TAZOBACTAM 3.375 G IVPB
3.3750 g | Freq: Three times a day (TID) | INTRAVENOUS | Status: DC
  Administered 2018-12-02 – 2018-12-18 (×47): 3.375 g via INTRAVENOUS
  Filled 2018-12-02 (×52): qty 50

## 2018-12-02 MED ORDER — SIMETHICONE 80 MG PO CHEW: 80.0000 mg | CHEWABLE_TABLET | Freq: Four times a day (QID) | ORAL | Status: DC | PRN

## 2018-12-02 MED ORDER — SODIUM CHLORIDE 0.45 % IV SOLN: INTRAVENOUS | Status: DC

## 2018-12-02 MED ORDER — METOPROLOL TARTRATE 5 MG/5ML IV SOLN
5.0000 mg | Freq: Once | INTRAVENOUS | Status: DC
  Filled 2018-12-02: qty 5

## 2018-12-02 MED ORDER — SODIUM CHLORIDE 0.9 % IV SOLN: 2.0000 g | Freq: Three times a day (TID) | INTRAVENOUS | Status: DC

## 2018-12-02 MED ORDER — DIVALPROEX SODIUM 125 MG PO CSDR: 125.0000 mg | DELAYED_RELEASE_CAPSULE | Freq: Two times a day (BID) | ORAL | Status: DC

## 2018-12-02 MED ORDER — LEVALBUTEROL HCL 0.63 MG/3ML IN NEBU
0.6300 mg | INHALATION_SOLUTION | Freq: Once | RESPIRATORY_TRACT | Status: AC
  Administered 2018-12-02: 20:00:00 0.63 mg via RESPIRATORY_TRACT

## 2018-12-02 MED ORDER — ACETAMINOPHEN 325 MG PO TABS: 650.0000 mg | ORAL_TABLET | ORAL | Status: DC | PRN

## 2018-12-02 MED ORDER — SODIUM CHLORIDE 0.9 % IV SOLN
2.0000 g | Freq: Three times a day (TID) | INTRAVENOUS | Status: DC
  Administered 2018-12-02: 2 g via INTRAVENOUS
  Filled 2018-12-02 (×3): qty 2

## 2018-12-02 MED ORDER — CIPROFLOXACIN HCL 0.3 % OP SOLN: 1.0000 [drp] | Freq: Four times a day (QID) | OPHTHALMIC | 0 refills | Status: DC

## 2018-12-02 MED ORDER — LEVALBUTEROL HCL 0.63 MG/3ML IN NEBU
0.6300 mg | INHALATION_SOLUTION | Freq: Four times a day (QID) | RESPIRATORY_TRACT | Status: DC
  Administered 2018-12-03 – 2018-12-10 (×27): 0.63 mg via RESPIRATORY_TRACT
  Filled 2018-12-02 (×28): qty 3

## 2018-12-02 MED ORDER — MODAFINIL 100 MG PO TABS: 100.0000 mg | ORAL_TABLET | Freq: Every day | ORAL | Status: DC

## 2018-12-02 MED ORDER — TOPIRAMATE 25 MG PO TABS: 25.0000 mg | ORAL_TABLET | Freq: Every day | ORAL | Status: DC

## 2018-12-02 NOTE — Significant Event (Signed)
Rapid Response Event Note  Overview: Time Called: [id number] Arrival Time: 1750 Event Type: Respiratory, Cardiac  Initial Focused Assessment: Patient in RAF rate 190s BP 121/79  HR 190s  RR 40s  O2 sat 96% Lung sounds clear Heart tones irregular Pain right flank,  Guarding Short grunting breaths, complaining of pain in right flank. Rectal temp 102.8 Spoke with Dan Angiulli via phone  Interventions: 5mg  Lopressor 1L NS bolus 2mg  Morphine given IV CT abdomen Pelvis done  [facility] readmitted to 2C02 RN at bedside to receive patient  Plan of Care (if not transferred):  Event Summary: Name of Physician Notified: Luan Pulling at    Name of Consulting Physician Notified: Garba/[facility] at [place]  Outcome: Transferred (Comment)  Event End Time: 2002  Raliegh Ip

## 2018-12-02 NOTE — Progress Notes (Signed)
KUB today due to his abdominal pain negative.  Patient did have good results with soapsuds enema.  Now noted fever 102.  Oxygen saturations are good at 99% room air.  Order for blood cultures as well as CBC with CMET as well as check chest x-ray.  Will begin cefepime empirically

## 2018-12-02 NOTE — Progress Notes (Signed)
Speech Language Pathology Daily Session Note  Patient Details  Name: Glenn [name] MRN: [id number] Date of Birth: August 16, 1950  Today's Date: 12/02/2018 SLP Individual Time: [id number] SLP Individual Time Calculation (min): 15 min  Short Term Goals: Week 3: SLP Short Term Goal 1 (Week 3): Pt will consume current dysphagia 1 solid, thin liquid diet with min overt s/s aspiration, efficient mastication and oral clearance with Min A verbal cues for use of swallow stategies. SLP Short Term Goal 2 (Week 3): Pt will maintain alertness with Min A and demonstrate efficient mastication and oral clearance with upgraded dysphagia 2 solid trials prior to solid advancement. SLP Short Term Goal 3 (Week 3): Pt will demonstate basic problem solving during familiar functional tasks with Mod A verbal/visual cues. SLP Short Term Goal 4 (Week 3): Pt will sustain attention for ~5 minutes with Mod A cues. SLP Short Term Goal 5 (Week 3): Pt will recall daily information with Min A for use of compensatory memory strategies. SLP Short Term Goal 6 (Week 3): Pt will increase speech intelligibility to ~80% at the sentence level and Min A for use of clear speech strategies.  Skilled Therapeutic Interventions: Pt was seen for skilled ST targeting speech intelligibility goals. Pt was limited in his participation due to discomfort. He reported he had a successful bowel movement, which eased abdominal pain/pressure previously noted earlier in day. However, he continued to present with audible respirations, although denied difficulty breathing (RN aware). Pt's speech intelligibility was poor in comparison to previously targeted session (reduced to ~50%), and he would only communicate at the word and short phrase level (previously communicating at sentence level or above). Total A required to verbally recall, and Max A for utilization of clear speech strategies. Pt was eventually unable to maintain alertness and ceased answering  clinician's questions. He managed to state "I'm too tired" near end of session. Pt missed 15 additional minutes of skilled ST this afternoon. Continue per current plan of care.      Pain Pain Assessment Pain Scale: Faces Faces Pain Scale: Hurts little more Pain Type: Acute pain Pain Location: (unable to specify) Pain Orientation: Mid Pain Descriptors / Indicators: Squeezing;Tightness Pain Onset: Sudden Pain Intervention(s): Other (Comment)(RN already aware) Multiple Pain Sites: No  Therapy/Group: Individual Therapy  Arbutus Leas 12/02/2018, 2:36 PM

## 2018-12-02 NOTE — Progress Notes (Signed)
Occupational Therapy Session Note  Patient Details  Name: Glenn [name] MRN: [id number] Date of Birth: 23-Nov-1950  Today's Date: 12/02/2018 OT Individual Time: [id number] OT Individual Time Calculation (min): 60 min    Short Term Goals: Week 3:  OT Short Term Goal 1 (Week 3): Pt will don shirt with mod A OT Short Term Goal 2 (Week 3): Pt will don pants with max A sit to stand with one caregiver OT Short Term Goal 3 (Week 3): Pt will demonstrate sustained attention to ADL task with min cues. OT Short Term Goal 4 (Week 3): Pt will complete toileting tasks with max A  Skilled Therapeutic Interventions/Progress Updates:    Pt resting in bed upon arrival and greeted therapist appropriately. OT intervention with focus on bed mobility, sit<>stand in [place], dressing with hemi dressing techniques, attention to L, attention to LUE, standing balance in [place], task initiation, attention to task, and safety awareness to increase independence with BADLs. Pt requires more than a reasonable amount of time to initiate tasks after commanded. Pt requires max verbal cues to attend to tasks and frequently becomes distracted while performing task. Focus on initiating and completing dressing tasks this morning.  Pt tot A for dressing tasks.  Sit<>stand in [place] with mod A and pt unable to extend trunk when standing.  Pt frequently collapses onto LLE and occasionally pushes to L. Pt unable to correct standing balance without max A. Pt remained in w/c with all needs within reach, belt alarm activated, and half lap tray in place.  Therapy Documentation Precautions:  Precautions Precautions: Fall, ICD/Pacemaker Precaution Comments: L hemi; L inattention Restrictions Weight Bearing Restrictions: No    Pain: Pt denies pain this morning  Therapy/Group: Individual Therapy  Leroy Libman 12/02/2018, 9:17 AM

## 2018-12-02 NOTE — Discharge Summary (Deleted)
  The note originally documented on this encounter has been moved the the encounter in which it belongs.  

## 2018-12-02 NOTE — Progress Notes (Signed)
Pharmacy Antibiotic Note  Glenn [name] is a 68 y.o. male transferred from CIR to acute care on 12/02/2018 with sepsis (unknown source) and a fib with RVR.  Pharmacy has been consulted for vancomycin and Zosyn dosin for sepsis.   Pt rec'd one dose of cefepime 2 gm this afternoon prior to transfer from CIR to acute care.  Medical history includes: atrial fibrillation (maintained on Eliquis in the past), S/P ICD and recent cardioversion [id number], diastolic congestive heart failure with ejection fraction 15 to 20%, hepatitis C, HTN, prior left MCA infarction 2018 with thrombectomy of left M2 occlusion, tobacco abuse, remote history of cocaine use and medical noncompliance. Pt was admitted to [facility] on 11/10/18 with large right MCA subacute hemorrhage; CT angiogram of head/neck showed subtotal occlusion of right M1 segment with irregularity consistent with thrombus (Eliquis was  reversed with Eppie Gibson). Pt had a recent Klebsiella pneumoniae UTI, treated with cephalexin.   This afternoon, pt complained of abdominal pain (KUB negative); no abscess or bleeding noted on abdominal CT this evening; mild atelectasis or infiltrate noted at right lung base. This evening, rapid response was called for a fib with RVR. WBC has increased from 4.7 yesterday to 11.3 this afternoon; Tmax 102.8 F; resp rate 35-40/min; BP 105/76; Scr 1.39, CrCl 54.9 ml/min.  Plan: Zosyn 3.375 gm IV Q 8 hrs (extended infusion) Vancomycin 1750 mg IV X 1, followed by vancomycin 1500 mg IV Q 24 hrs (estimated vancomycin AUC with this regimen, using Scr 1.39, is 536.2; goal vancomycin AUC is 400-550) Monitor WBC, temp, clinical improvement, renal function, vancomycin levels as indicated, cultures   Temp (24hrs), Avg:100.3 F (37.9 C), Min:97.8 F (36.6 C), Max:102.8 F (39.3 C)  Recent Labs  Lab 11/28/18 0551 12/01/18 0726 12/02/18 1528 12/02/18 2008  WBC 4.7 4.7 11.3*  --   CREATININE 1.05 1.07 1.39*  --    LATICACIDVEN  --   --   --  1.2    Estimated Creatinine Clearance: 54.9 mL/min (A) (by C-G formula based on SCr of 1.39 mg/dL (H)).    Allergies  Allergen Reactions  . Benadryl [Diphenhydramine] Palpitations    Microbiology results: 10/13 BCx X 2: pending 9/21 COVID: negative  9/27 Urine cx: >100,000 colonies/ml Kleb pneumoniae, suscept to all abx tested except ampicillin, nitrofurantoin (treated with PO cephalexin) 9/21 MRSA PCR: negative 9/9 BCx X 2: NG/final 10/6 BCx X 2: NG/final   Thank you for allowing pharmacy to be a part of this patient's care.  Gillermina Hu, PharmD, BCPS, [facility] Clinical Pharmacist 12/02/2018 8:45 PM

## 2018-12-02 NOTE — Care Management (Signed)
This is a no charge note  I was called urgently to see this patient on the floor 59C73.  68 year old man with past medical history of hypertension, hyperlipidemia, atrial fibrillation, sCHF with EF of 15-20%, hepatitis C, CKD stage III, recent admission due to right MCA stroke with hemorrhage and left-sided paralysis, just discharged to rehab on 10/13.  Patient developed A. fib with RVR with heart rate up to 180s.  Per report, patient had abdominal pain.  CT scan of abdomen/pelvis was done, which showed no retroperitoneal hemorrhage, small right pleural effusion, gallstone with no evidence of cholecystitis.  Patient was started cefepime. When I saw pt on the floor, patient is very drowsy, but still knows his name and knows where he is, and he is confused about the time.  He moves all extremities.  He seems to have abdominal tenderness on examination.  WBC 11.3, worsening renal function, negative urinalysis, temperature 102.8, blood pressure 114/100, tachypneic with RR 35-49.  Oxygen saturation 96% on nonrebreather.  ABG with pH 7.435, PCO2 31.1, PO2 69.8.  Patient was given 5 mg of metoprolol, heart rate improved to 160s.   -changed antibiotics from cefepime to vancomycin and Zosyn -Lab: CBC, CMP, lactic acid, procalcitonin, CMP, ammonia level -IV Cardizem drip. -PCCM was consulted. Care is transferred to Dr. Mariane Masters. Pt is transferred to ICU.   Ivor Costa, MD  [facility]   If 7PM-7AM, please contact night-coverage [email or web address] Password [email or web address] 12/02/2018, 9:21 PM

## 2018-12-02 NOTE — Progress Notes (Signed)
Iv team at bedside attempting to get midline

## 2018-12-02 NOTE — Progress Notes (Signed)
eLink Physician-Brief Progress Note Patient Name: Glenn [name] DOB: 1950/12/03 MRN: [id number]   Date of Service  12/02/2018  HPI/Events of Note  Pt transferred from the Progressive Care Unit due to impending respiratory failure due to extreme work of breathing. He has a history of cardiomyopathy with EF  Of 15 %, Afib with RVR, and recent right MCA CVA. Pt is currently on BIPAP.  eICU Interventions  New patient evaluation completed.        [name] 12/02/2018, 9:47 PM

## 2018-12-02 NOTE — Consult Note (Addendum)
NAME:  Glenn [name], MRN:  [id number], DOB:  06-28-1950, LOS: 0 ADMISSION DATE:  12/02/2018, CONSULTATION DATE:  12/02/2018 REFERRING MD:  Dr. Blaine Hamper, CHIEF COMPLAINT:  Resp distress  Brief History   68 year old male transferred from CIR to  with fever, afib RVR, abdominal pain, and worsening respiratory distress.  PCCM consulted for further medical management.   History of present illness   HPI obtained from medical chart review patient is limited currently with review of symptoms secondary to respiratory distress.  68 year old male with past medical history significant for hypertension, hyperlipidemia, A. Fib previously on eliquis, HFrEF with EF 15-20% s/p ICD and cardioversion 10/29/2018, Hep C, CKD stage III, who most recently being admitted on 9/21 with left hemiparessis found to have large right MCA subacute hemorrhage. Additionally found to have acute nonischemic cardiomyopathy with EF 15-20% s/p ICD placement and cardioversion 9/9 for Afib.  He was treated for Klebsiella UTI with ceftriaxone completed on 10/8.   He was discharged to CIR on 10/13.     Reported abdominal pain s/p enema with bowel movement.   Noted to have fever 102 then with progressive respiratory distress.  CXR without acute change.  Cultures sent and started on empiric cefepime and IVF bolus and maintaince fluids started.  He was sent for CT abd/pelvis then transferred to PCU with [place] accepting.  CT showed small right pleural effusion, gallstones with no evidence of cholecystitis, no retroperitoneal hemorrhage.  He was found to be tachypneic in the 40's and on NRB.  Rapid response at bedside.  Required cardizem gtt for Afib with RVR.  Additionally treated with fentanyl for pain/ distress.  No reported episodes of hypotension.   Abx broadened to zosyn and vancomycin.  ABG reassuring at 7.43/ 31/ 69/ 20 on [place].  PCCM called for further medical management and respiratory distress.   Past Medical History  Hypertension,  hyperlipidemia, A. Fib, HFrEF with EF 15-20% s/p ICD and recent cardioversion 10/29/2018, Hep C, CKD stage III, right MCA stroke with hemorrhage with residual left sided hemiplegia, tobacco/ cocaine abuse/ medical non-compliance  Significant Hospital Events   10/13 tx from CIR  Consults:   Procedures:   Significant Diagnostic Tests:  12/02/2018 CTH a/p >> 1. No evidence of retroperitoneal hematoma. 2. Small right pleural effusion layering dependently. Mild atelectasis or infiltrate at the right lung base. 3. Aortic atherosclerosis. 4. Gallstones in the gallbladder but no CT evidence of cholecystitis. 5. No intraperitoneal abscess.  No acute bowel pathology evident. Aortic Atherosclerosis   Micro Data:  10/13 BCx 2 >> 10/13 UC >>  Antimicrobials:  10/8 ceftriaxone completed  10/13 cefepime 10/13 zosyn >> 10/13 vanc >>  Interim history/subjective:  On NRB, s/p fentanyl.  Mental status waxes/wanes Pt c/o of HA  Objective   Blood pressure 105/76, pulse (!) 50, temperature 99 F (37.2 C), temperature source Oral, resp. rate (!) 35, SpO2 97 %.       No intake or output data in the 24 hours ending 12/02/18 2057 There were no vitals filed for this visit.  Examination: General: Acute ill appearing male in distress  HEENT: MM pink/moist, pupils 3/reactive Neuro: Awake, speech limited 2/2 resp distress, able to given thumbs up on right, LUE flaccid, wiggles toes on left and weak movement on right  CV: IRIR- afib with RVR up to 160 rates now improved PULM:  resp distress, tachypneic in 40's with grunting, severely diminished throughout GI: soft, some tenderness, bs +, condom cath Extremities: generalized  pitting edema, Left side (3 LLE) > Right, some warmth to left calf area, no obvious tenderness/ erythema  Skin: no rashes  Resolved Hospital Problem list     Assessment & Plan:   Acute hypoxic respiratory insufficiency - ddx include aspiration pna/ pneumonitis, volume  overload, PE, vs poor tolerance from Afib RVR High risk for intubation -CXR clear thus far but aspiration may have not had time to develop on CXR, no clear fluid overload P:  tx to ICU for close monitoring BiPAP now and continue- tachpynea and HR improving, getting good TVs.  Will need to monitor neuro status closely while on BiPAP CXR now and trend ABG in 1 hour Continue xopenex and atrovent nebs  Aspiration precautions NPO while on BiPAP abx as below Consider CTA chest pending renal function and check LE dopplers given left sided leg swelling (could be dependent edema given his left sided paralysis   SIRS - increasing leukocytosis, source unclear  - CT a/p without acute findings, LFTs wnl - hx of pan sensitive Klebsiella UTI P:  Sending UA/ UC Checking PCT  Follow culture data Continue zosyn/ vanc for now Monitor hemodynamics Lactic wnl   Afib with RVR - previously on eliquis d/c after hemorrhagic stroke P:  Tele monitoring Continue cardizem gtt for HR <120, monitor MAP, goal >65 Check mag/ KCK for goal Mag>2/ KCL >4   NICM with EF 15-20% w/ICD P:  Hold lasix for now (last dose this am) weights have been stable Monitor weights/ fluid status EKG non acute, trending hs-trop  AKI on CKD stage III - some reports of neurogenic bladder in rehab requiring I/O caths P:  S/p IVF  Placed foley and send UA/ UC Trend BMP / mag/ urinary output Replace electrolytes as indicated Avoid nephrotoxic agents, ensure adequate renal perfusion  Hx MCA hemorrhagic stroke with residual facial droop, left hemiparesis and dysphagia  P:  CTH if any neuro changes, no acute new focal deficits and he is able to follow commands.  Mental status waxes/ wanes - more alert in am Ammonia normal at 23 Trending neuro exams Ongoing PT/ OT/ SLP  HLD P:  Continue lipitor  Hx Hep C P:  F/u outpatient   Best practice:  Diet: NPO for now  Pain/Anxiety/Delirium protocol (if indicated): n/a  VAP protocol (if indicated): n/a DVT prophylaxis: Lovenox GI prophylaxis: n/a Glucose control: cbg q4 add SSI if glucose >180 Mobility: BR for now, will need PT/ OT Code Status: Full  Family Communication: pending Disposition: tx from [place] to ICU  Labs   CBC: Recent Labs  Lab 11/28/18 0551 12/01/18 0726 12/02/18 1528 12/02/18 2018  WBC 4.7 4.7 11.3* 15.6*  NEUTROABS 2.7 2.4  --  12.3*  HGB 11.3* 11.0* 12.6* 12.3*  HCT 33.6* 33.1* 38.0* 36.1*  MCV 94.6 95.7 95.7 94.5  PLT 188 220 276 0000000    Basic Metabolic Panel: Recent Labs  Lab 11/28/18 0551 12/01/18 0726 12/02/18 1528  NA 139 139 137  K 4.3 4.3 4.9  CL 103 104 102  CO2 24 25 23   GLUCOSE 110* 115* 149*  BUN 16 11 15   CREATININE 1.05 1.07 1.39*  CALCIUM 8.6* 9.0 9.2   GFR: Estimated Creatinine Clearance: 54.9 mL/min (A) (by C-G formula based on SCr of 1.39 mg/dL (H)). Recent Labs  Lab 11/28/18 0551 12/01/18 0726 12/02/18 1528 12/02/18 2008 12/02/18 2018  WBC 4.7 4.7 11.3*  --  15.6*  LATICACIDVEN  --   --   --  1.2  --  Liver Function Tests: Recent Labs  Lab 11/28/18 0551 12/01/18 0726 12/02/18 1528  AST 27 24 25   ALT 18 14 16   ALKPHOS 53 55 68  BILITOT 0.8 0.5 0.8  PROT 5.8* 6.2* 7.4  ALBUMIN 2.5* 2.6* 3.1*   No results for input(s): LIPASE, AMYLASE in the last 168 hours. No results for input(s): AMMONIA in the last 168 hours.  ABG    Component Value Date/Time   PHART 7.435 12/02/2018 1948   PCO2ART 31.1 (L) 12/02/2018 1948   PO2ART 69.8 (L) 12/02/2018 1948   HCO3 20.5 12/02/2018 1948   TCO2 22 11/10/2018 1801   ACIDBASEDEF 3.0 (H) 12/02/2018 1948   O2SAT 92.8 12/02/2018 1948     Coagulation Profile: No results for input(s): INR, PROTIME in the last 168 hours.  Cardiac Enzymes: No results for input(s): CKTOTAL, CKMB, CKMBINDEX, TROPONINI in the last 168 hours.  HbA1C: Hgb A1c MFr Bld  Date/Time Value Ref Range Status  11/11/2018 03:29 AM 4.9 4.8 - 5.6 % Final    Comment:     (NOTE) Pre diabetes:          5.7%-6.4% Diabetes:              >6.4% Glycemic control for   <7.0% adults with diabetes   09/06/2016 05:00 AM 4.7 (L) 4.8 - 5.6 % Final    Comment:    DEMOGRAPHIC UPDATE OCCURRED ON 07/20 AT 1046, QA FLAGS AND RANGES MAY NO LONGER BE VALID (NOTE)         Pre-diabetes: 5.7 - 6.4         Diabetes: >6.4         Glycemic control for adults with diabetes: <7.0     CBG: No results for input(s): GLUCAP in the last 168 hours.  Review of Systems:   Unable  Past Medical History  He,  has a past medical history of Atrial fibrillation ([place]), CHF (congestive heart failure) ([place]), Hepatitis C, Hypertension, Stroke [facility]), and Visit for monitoring Tikosyn therapy (03/26/2017).   Surgical History    Past Surgical History:  Procedure Laterality Date  . CARDIAC DEFIBRILLATOR PLACEMENT  2015  . CARDIOVERSION N/A 10/10/2016   Procedure: CARDIOVERSION;  Surgeon: Dorothy Spark, MD;  Location: [place];  Service: Cardiovascular;  Laterality: N/A;  . CARDIOVERSION N/A 03/27/2017   Procedure: CARDIOVERSION;  Surgeon: Jerline Pain, MD;  Location: [facility] ENDOSCOPY;  Service: Cardiovascular;  Laterality: N/A;  . CARDIOVERSION N/A 10/29/2018   Procedure: CARDIOVERSION;  Surgeon: Sanda Klein, MD;  Location: [facility] ENDOSCOPY;  Service: Cardiovascular;  Laterality: N/A;  . CARDIOVERSION N/A 11/05/2018   Procedure: CARDIOVERSION;  Surgeon: Acie Fredrickson Wonda Cheng, MD;  Location: [facility];  Service: Cardiovascular;  Laterality: N/A;  . EYE SURGERY Left 1990  . IR PERCUTANEOUS ART THROMBECTOMY/INFUSION INTRACRANIAL INC DIAG ANGIO  09/05/2016  . IR RADIOLOGIST EVAL & MGMT  10/03/2016  . RADIOLOGY WITH ANESTHESIA N/A 09/05/2016   Procedure: RADIOLOGY WITH ANESTHESIA;  Surgeon: Luanne Bras, MD;  Location: [facility];  Service: Radiology;  Laterality: N/A;  . RIGHT/LEFT HEART CATH AND CORONARY ANGIOGRAPHY N/A 11/03/2018   Procedure: RIGHT/LEFT HEART CATH AND CORONARY ANGIOGRAPHY;   Surgeon: Lorretta Harp, MD;  Location: [facility] CV LAB;  Service: Cardiovascular;  Laterality: N/A;     Social History   reports that he has been smoking cigarettes. He has been smoking about 0.50 packs per day. He has never used smokeless tobacco. He reports previous alcohol use of about 3.0 standard drinks  of alcohol per week. He reports current drug use. Frequency: 2.00 times per week. Drug: Marijuana.   Family History   His family history includes High blood pressure in his father and mother; Stroke in his maternal aunt. There is no history of Heart disease.   Allergies Allergies  Allergen Reactions  . Benadryl [Diphenhydramine] Palpitations     Home Medications  Prior to Admission medications   Medication Sig Start Date End Date Taking? Authorizing Provider  acetaminophen (TYLENOL) 325 MG tablet Take 2 tablets (650 mg total) by mouth every 4 (four) hours as needed for mild pain (or temp > 37.5 C (99.5 F)). 12/02/18   Angiulli, Lavon Paganini, PA-C  atorvastatin (LIPITOR) 20 MG tablet Take 1 tablet (20 mg total) by mouth daily at 6 PM. 11/13/18   Donzetta Starch, NP  carvedilol (COREG) 12.5 MG tablet Take 1 tablet (12.5 mg total) by mouth 2 (two) times daily with a meal. 12/02/18   Angiulli, Lavon Paganini, PA-C  ceFEPIme 2 g in sodium chloride 0.9 % 100 mL Inject 2 g into the vein every 8 (eight) hours. 12/02/18   Angiulli, Lavon Paganini, PA-C  ciprofloxacin (CILOXAN) 0.3 % ophthalmic solution Place 1 drop into the left eye 4 (four) times daily. Administer 1 drop, every 2 hours, while awake, for 2 days. Then 1 drop, every 4 hours, while awake, for the next 5 days. 12/02/18   Angiulli, Lavon Paganini, PA-C  divalproex (DEPAKOTE SPRINKLE) 125 MG capsule Take 1 capsule (125 mg total) by mouth every 12 (twelve) hours. 12/02/18   Angiulli, Lavon Paganini, PA-C  furosemide (LASIX) 40 MG tablet Take 1 tablet (40 mg total) by mouth daily. 11/06/18   Mullis, Kiersten P, DO  gabapentin (NEURONTIN) 300 MG capsule TAKE 1  CAPSULE(300 MG) BY MOUTH THREE TIMES DAILY Patient taking differently: Take 300 mg by mouth 3 (three) times daily.  10/10/18   [name], MD  lidocaine (LIDODERM) 5 % Place 1 patch onto the skin daily. Remove & Discard patch within 12 hours or as directed by MD 12/03/18   Angiulli, Lavon Paganini, PA-C  lidocaine (LMX) 4 % cream Apply topically 3 (three) times daily. 12/02/18   Angiulli, Lavon Paganini, PA-C  modafinil (PROVIGIL) 100 MG tablet Take 1 tablet (100 mg total) by mouth daily at 6 (six) AM. 12/03/18   Angiulli, Lavon Paganini, PA-C  potassium chloride SA (K-DUR) 20 MEQ tablet Take 1 tablet (20 mEq total) by mouth daily. 11/06/18   Mullis, Kiersten P, DO  sacubitril-valsartan (ENTRESTO) 24-26 MG Take 1 tablet by mouth 2 (two) times daily. 11/06/18   Mullis, Kiersten P, DO  spironolactone (ALDACTONE) 25 MG tablet Take 0.5 tablets (12.5 mg total) by mouth daily. 12/03/18   Angiulli, Lavon Paganini, PA-C  topiramate (TOPAMAX) 25 MG tablet Take 1 tablet (25 mg total) by mouth at bedtime. 12/02/18   Angiulli, Lavon Paganini, PA-C  traMADol (ULTRAM) 50 MG tablet Take 1 tablet (50 mg total) by mouth every 8 (eight) hours as needed for severe pain. 12/02/18   Angiulli, Lavon Paganini, PA-C     CRITICAL CARE Performed by: Kennieth Rad     Total critical care time: 60 minutes   Critical care time was exclusive of separately billable procedures and treating other patients.   Critical care was necessary to treat or prevent imminent or life-threatening deterioration.   Critical care was time spent personally by me on the following activities: development of treatment plan with patient and/or surrogate as well as  nursing, discussions with consultants, evaluation of patient's response to treatment, examination of patient, obtaining history from patient or surrogate, ordering and performing treatments and interventions, ordering and review of laboratory studies, ordering and review of radiographic studies, pulse oximetry and  re-evaluation of patient's condition.   Patient seen and examined at bedside with NP Simpson.  We discussed above critical care planning and assessment.  I agree with above.  Will watch in icu on BIPAP, will intubate if necessary.  Dr Laurelyn Sickle MD   Kennieth Rad, MSN, AGACNP-BC [place] Pgr: [phone] or if no answer [phone] 12/02/2018, 10:19 PM

## 2018-12-02 NOTE — Significant Event (Addendum)
Patient health status continues to decline.  RR nurse at bedside assisting.  Plan to transfer once bed available to Dr. Alcario Drought with [facility] for sepsis/ afib with rvr.  Patient hooked up to propak with HR consistently in the 180s.  Order for metoprolol 5 mg IV stat.  Metoprolol  5 mg iv given with response in HR ranging from 140-170s.  Patient increasingly tachypneic and moaning in pain.  Complains of right sided abdominal pain, especially tender to touch.  CT abdomen ordered, waiting until patient gets transferred to other unit to complete. Order obtained for IV morphine 2 mg once.  IVF bolus 1L NS administered.  1/2 NS still infusing at 50/hr.  Brita Romp, RN

## 2018-12-02 NOTE — Significant Event (Signed)
Stat EKG performed as ordered.  Impression shows Afib with RVR, nonspecific ST and T wave abnormality.  Rhythm strip ran- ventricular rate 184.  Results called to Mar[place]e [place], [place].  RN already called RR nurse for advice.  Patient continues to have labored, tachypneic breathing.  IV antibiotic infusion complete.  1/2 NS running at 50 ml/hr.  PA discussed calling internal medicine for further evaluation.  Will continue to monitor.  Brita Romp, RN

## 2018-12-02 NOTE — Discharge Summary (Signed)
Physician Discharge Summary  Patient ID: Glenn [name] MRN: [id number] DOB/AGE: [birth year] 68 y.o.  Admit date: (Not on file) Discharge date: 12/02/2018  Discharge Diagnoses:  Right MCA infarction with hemorrhagic conversion DVT prophylaxis Pain management Mood stabilization Atrial fibrillation status post ICD Acute on chronic diastolic congestive heart failure Nonischemic cardiomyopathy Hypertension Hyperlipidemia Post stroke dysphasia AKI History of tobacco and marijuana use Medical noncompliance Chronic hep C Right trochanteric bursitis Fever Neurogenic bladder Constipation  Discharged Condition: Guarded  Significant Diagnostic Studies: Ct Angio Head W Or Wo Contrast  Result Date: 11/10/2018 CLINICAL DATA:  Stroke with left-sided weakness and slurred speech EXAM: CT ANGIOGRAPHY HEAD AND NECK TECHNIQUE: Multidetector CT imaging of the head and neck was performed using the standard protocol during bolus administration of intravenous contrast. Multiplanar CT image reconstructions and MIPs were obtained to evaluate the vascular anatomy. Carotid stenosis measurements (when applicable) are obtained utilizing NASCET criteria, using the distal internal carotid diameter as the denominator. CONTRAST:  36mL OMNIPAQUE IOHEXOL 350 MG/ML SOLN COMPARISON:  CT head 11/10/2018 FINDINGS: CTA NECK FINDINGS Aortic arch: Standard branching. Imaged portion shows no evidence of aneurysm or dissection. No significant stenosis of the major arch vessel origins. Right carotid system: Calcified and noncalcified plaque right carotid bulb narrowing the lumen by approximately 35-40% diameter stenosis. Left carotid system: Mild atherosclerotic disease left carotid bifurcation without significant stenosis. Vertebral arteries: Both vertebral arteries patent to the basilar without significant stenosis. Skeleton: Cervical spondylosis without acute abnormality. Other neck: Negative Upper chest: Negative Review  of the MIP images confirms the above findings CTA HEAD FINDINGS Anterior circulation: Heavily calcified right cavernous carotid with moderate stenosis. Irregular subtotal occlusion of the right M1 segment with good distal flow. The area of stenosis has irregularity most compatible with thrombus. Left middle cerebral artery widely patent. Anterior cerebral arteries patent bilaterally without significant stenosis. Posterior circulation: Both vertebral arteries patent to the basilar. PICA patent bilaterally. Atherosclerotic irregularity and mild to moderate stenosis in the mid and distal basilar artery. Superior cerebellar and posterior cerebral arteries patent bilaterally. Mild atherosclerotic disease in the posterior cerebral arteries bilaterally. Venous sinuses: Patent Anatomic variants: None Review of the MIP images confirms the above findings IMPRESSION: 1. Subtotal occlusion right M1 segment with irregularity most consistent with thrombus. Good distal flow. Associated hemorrhagic infarct right MCA territory noted on earlier CT head. Moderate atherosclerotic stenosis right cavernous carotid. 35-40% diameter stenosis right internal carotid artery stenosis at the origin. 2. Left carotid widely patent with mild atherosclerotic disease at the bifurcation. Both vertebral arteries widely patent. 3. Mild moderate basilar stenosis. Mild atherosclerotic disease posterior cerebral artery bilaterally. 4. These results were called by telephone at the time of interpretation on 11/10/2018 at 6:45 pm to provider ERIC [facility] , who verbally acknowledged these results. 5. Electronically Signed   By: Franchot Gallo M.D.   On: 11/10/2018 18:56   Dg Chest 2 View  Result Date: 12/02/2018 CLINICAL DATA:  Shortness of breath EXAM: CHEST - 2 VIEW COMPARISON:  11/27/2018 FINDINGS: Left AICD remains in place, unchanged. Cardiomegaly. No confluent opacities, effusions or edema. No acute bony abnormality. IMPRESSION: Cardiomegaly.  No  active disease. Electronically Signed   By: Rolm Baptise M.D.   On: 12/02/2018 16:31   Dg Chest 2 View  Result Date: 11/27/2018 CLINICAL DATA:  Fluid overload smoker history of CHF EXAM: CHEST - 2 VIEW COMPARISON:  11/25/2018 FINDINGS: Left-sided pacing device as before. Cardiomegaly. No significant pleural effusion. Aortic atherosclerosis. Streaky atelectasis left base. IMPRESSION: Cardiomegaly  without overt failure or significant pleural effusion. Streaky atelectasis left base. Electronically Signed   By: Donavan Foil M.D.   On: 11/27/2018 21:27   Dg Chest 2 View  Result Date: 11/25/2018 CLINICAL DATA:  Intermittent fever. EXAM: CHEST - 2 VIEW COMPARISON:  11/17/2018 FINDINGS: Lordotic technique demonstrated. Left-sided pacemaker unchanged. Lungs are adequately inflated and otherwise clear. Stable cardiomegaly. Remainder of the exam is unchanged. IMPRESSION: No acute cardiopulmonary disease. Stable cardiomegaly. Electronically Signed   By: Marin Olp M.D.   On: 11/25/2018 11:17   Dg Chest 2 View  Result Date: 11/17/2018 CLINICAL DATA:  Congestion and cough. EXAM: CHEST - 2 VIEW COMPARISON:  11/16/2018 FINDINGS: Left-sided pacemaker unchanged. Lungs are adequately inflated without consolidation or effusion. Mild cardiomegaly which is stable. Remainder the exam is unchanged. IMPRESSION: No active cardiopulmonary disease. Electronically Signed   By: Marin Olp M.D.   On: 11/17/2018 12:35   Dg Chest 2 View  Result Date: 11/16/2018 CLINICAL DATA:  Stroke last week.  Cough and fever. EXAM: CHEST - 2 VIEW COMPARISON:  November 10, 2018 FINDINGS: Stable AICD device and cardiomegaly. The hila and mediastinum are unchanged. No pulmonary nodules, masses, or focal infiltrates. IMPRESSION: No cause for cough or fever identified. Electronically Signed   By: Dorise Bullion III M.D   On: 11/16/2018 16:48   Dg Abd 1 View  Result Date: 12/02/2018 CLINICAL DATA:  Right side abdominal pain and nausea for  1 week. EXAM: ABDOMEN - 1 VIEW COMPARISON:  None. FINDINGS: The bowel gas pattern is normal. No radio-opaque calculi or other significant radiographic abnormality are seen. IMPRESSION: Negative exam. Electronically Signed   By: Inge Rise M.D.   On: 12/02/2018 13:09   Ct Head Wo Contrast  Result Date: 11/11/2018 CLINICAL DATA:  Intracranial hemorrhage, known, follow-up. Additional history provided: Patient reports frontal headache today. EXAM: CT HEAD WITHOUT CONTRAST TECHNIQUE: Contiguous axial images were obtained from the base of the skull through the vertex without intravenous contrast. COMPARISON:  CT angiogram head/neck 11/10/2018, noncontrast head CT 11/10/2018 FINDINGS: Brain: Again demonstrated is a large region of cortical/subcortical edema within the right frontal lobe with additional involvement of the right insula and extending to the right basal ganglia. Hemorrhage centered within the right caudate head and extending inferiorly into the right basal ganglia has not significantly changed in size or appearance, measuring 2.0 x 3.4 cm (TV x CC) on the current examination (previously 2 x 3.3 cm). Associated mass effect with mild right to left subfalcine herniation, partial effacement of the right lateral ventricle frontal horn and 2 mm leftward midline shift at the level of the septum pellucidum, similar to prior examination. Also similar to prior examination there is small volume intraventricular hemorrhage within the atrium of the right lateral ventricle. Vascular: Persistent hyperdensity within the M1 right middle cerebral artery. Subtotal occlusion of the M1 right MCA was noted on CT angiogram head 11/10/2018. Atherosclerotic calcification of the carotid artery siphons. Skull: No calvarial fracture. Sinuses/Orbits: Mild scattered paranasal sinus mucosal thickening. No significant mastoid effusion Visualized orbits demonstrate no acute abnormality. IMPRESSION: 1. No significant interval change  in large subacute right MCA/ACA vascular territory hemorrhagic infarction as compared to head CT 11/10/2018. An area of hemorrhage centered within the right caudate head and extending inferiorly within the right basal ganglia measures 2 x 3.4 cm (previously 2 x 3.3). Small volume hemorrhage extending within the atrium of the right lateral ventricle has not significantly changed. 2. Mass effect with partial effacement of  the right lateral ventricle frontal horn, right to left subfalcine herniation and 2 mm leftward midline shift, also similar to prior exam. Electronically Signed   By: Kellie Simmering   On: 11/11/2018 09:38   Ct Angio Neck W Or Wo Contrast  Result Date: 11/10/2018 CLINICAL DATA:  Stroke with left-sided weakness and slurred speech EXAM: CT ANGIOGRAPHY HEAD AND NECK TECHNIQUE: Multidetector CT imaging of the head and neck was performed using the standard protocol during bolus administration of intravenous contrast. Multiplanar CT image reconstructions and MIPs were obtained to evaluate the vascular anatomy. Carotid stenosis measurements (when applicable) are obtained utilizing NASCET criteria, using the distal internal carotid diameter as the denominator. CONTRAST:  88mL OMNIPAQUE IOHEXOL 350 MG/ML SOLN COMPARISON:  CT head 11/10/2018 FINDINGS: CTA NECK FINDINGS Aortic arch: Standard branching. Imaged portion shows no evidence of aneurysm or dissection. No significant stenosis of the major arch vessel origins. Right carotid system: Calcified and noncalcified plaque right carotid bulb narrowing the lumen by approximately 35-40% diameter stenosis. Left carotid system: Mild atherosclerotic disease left carotid bifurcation without significant stenosis. Vertebral arteries: Both vertebral arteries patent to the basilar without significant stenosis. Skeleton: Cervical spondylosis without acute abnormality. Other neck: Negative Upper chest: Negative Review of the MIP images confirms the above findings CTA HEAD  FINDINGS Anterior circulation: Heavily calcified right cavernous carotid with moderate stenosis. Irregular subtotal occlusion of the right M1 segment with good distal flow. The area of stenosis has irregularity most compatible with thrombus. Left middle cerebral artery widely patent. Anterior cerebral arteries patent bilaterally without significant stenosis. Posterior circulation: Both vertebral arteries patent to the basilar. PICA patent bilaterally. Atherosclerotic irregularity and mild to moderate stenosis in the mid and distal basilar artery. Superior cerebellar and posterior cerebral arteries patent bilaterally. Mild atherosclerotic disease in the posterior cerebral arteries bilaterally. Venous sinuses: Patent Anatomic variants: None Review of the MIP images confirms the above findings IMPRESSION: 1. Subtotal occlusion right M1 segment with irregularity most consistent with thrombus. Good distal flow. Associated hemorrhagic infarct right MCA territory noted on earlier CT head. Moderate atherosclerotic stenosis right cavernous carotid. 35-40% diameter stenosis right internal carotid artery stenosis at the origin. 2. Left carotid widely patent with mild atherosclerotic disease at the bifurcation. Both vertebral arteries widely patent. 3. Mild moderate basilar stenosis. Mild atherosclerotic disease posterior cerebral artery bilaterally. 4. These results were called by telephone at the time of interpretation on 11/10/2018 at 6:45 pm to provider ERIC [facility] , who verbally acknowledged these results. 5. Electronically Signed   By: Franchot Gallo M.D.   On: 11/10/2018 18:56   [name] Contrast  Result Date: 11/12/2018 CLINICAL DATA:  Follow-up stroke.  Intracranial hemorrhage. EXAM: MRI HEAD WITHOUT CONTRAST TECHNIQUE: Multiplanar, multiecho pulse sequences of the brain and surrounding structures were obtained without intravenous contrast. COMPARISON:  Head CT 11/11/2018 and 11/10/2018 FINDINGS: Brain:  Diffusion imaging redemonstrates acute infarction in the anterior portion of the right middle cerebral artery territory and possibly some of the anterior cerebral artery territory, with confluent involvement of the right anterior frontal lobe, frontal operculum region, insula, basal ganglia and frontoparietal junction region. Hemorrhage into the right basal ganglia appears similar, maximal dimension 2 cm. Layering intraventricular blood in the occipital horn of the right lateral ventricle as seen previously. No acute infarction in the left hemisphere or affecting the brainstem or cerebellum. Swelling in the region of infarction, with right-to-left shift measuring up to 8 mm. No ventricular trapping. No extra-axial collection. Vascular: Major vessels at the  base of the brain show flow. Skull and upper cervical spine: Negative Sinuses/Orbits: Clear/normal Other: None IMPRESSION: No evidence of extension of the right brain infarction. Acute infarction affecting the right frontal lobe, frontal operculum, insula and frontoparietal junction region, with involvement of the right basal ganglia. 2 cm hematoma in the right basal ganglia has not enlarged. Intraventricular blood has not increased. Swelling and mass effect, right-to-left shift measuring up to 8 mm, unchanged when measured in the same location on yesterday's CT. Electronically Signed   By: Nelson Chimes M.D.   On: 11/12/2018 16:12   Dg Chest Portable 1 View  Result Date: 11/10/2018 CLINICAL DATA:  Stroke EXAM: PORTABLE CHEST 1 VIEW COMPARISON:  10/29/2018 FINDINGS: Left AICD remains in place, unchanged. Cardiomegaly. No confluent opacities, effusions or edema. No acute bony abnormality. IMPRESSION: Cardiomegaly.  No active disease. Electronically Signed   By: Rolm Baptise M.D.   On: 11/10/2018 19:58   Dg Swallowing Func-speech Pathology  Result Date: 11/12/2018 Objective Swallowing Evaluation: Type of Study: Bedside Swallow Evaluation  Patient Details  Name: Glenn [name] MRN: [id number] Date of Birth: 05-Aug-1950 Today's Date: 11/12/2018 Time: SLP Start Time (ACUTE ONLY): 0900 -SLP Stop Time (ACUTE ONLY): 0924 SLP Time Calculation (min) (ACUTE ONLY): 24 min Past Medical History: Past Medical History: Diagnosis Date . Atrial fibrillation ([place])  . CHF (congestive heart failure) ([place])  . Hepatitis C  . Hypertension  . Stroke ([place])  . Visit for monitoring Tikosyn therapy 03/26/2017 Past Surgical History: Past Surgical History: Procedure Laterality Date . CARDIAC DEFIBRILLATOR PLACEMENT  2015 . CARDIOVERSION N/A 10/10/2016  Procedure: CARDIOVERSION;  Surgeon: Dorothy Spark, MD;  Location: [place];  Service: Cardiovascular;  Laterality: N/A; . CARDIOVERSION N/A 03/27/2017  Procedure: CARDIOVERSION;  Surgeon: Jerline Pain, MD;  Location: [facility] ENDOSCOPY;  Service: Cardiovascular;  Laterality: N/A; . CARDIOVERSION N/A 10/29/2018  Procedure: CARDIOVERSION;  Surgeon: Sanda Klein, MD;  Location: [place] ENDOSCOPY;  Service: Cardiovascular;  Laterality: N/A; . CARDIOVERSION N/A 11/05/2018  Procedure: CARDIOVERSION;  Surgeon: Acie Fredrickson Wonda Cheng, MD;  Location: [place];  Service: Cardiovascular;  Laterality: N/A; . EYE SURGERY Left 1990 . IR PERCUTANEOUS ART THROMBECTOMY/INFUSION INTRACRANIAL INC DIAG ANGIO  09/05/2016 . IR RADIOLOGIST EVAL & MGMT  10/03/2016 . RADIOLOGY WITH ANESTHESIA N/A 09/05/2016  Procedure: RADIOLOGY WITH ANESTHESIA;  Surgeon: Luanne Bras, MD;  Location: [place];  Service: Radiology;  Laterality: N/A; . RIGHT/LEFT HEART CATH AND CORONARY ANGIOGRAPHY N/A 11/03/2018  Procedure: RIGHT/LEFT HEART CATH AND CORONARY ANGIOGRAPHY;  Surgeon: Lorretta Harp, MD;  Location: [place] CV LAB;  Service: Cardiovascular;  Laterality: N/A; HPI: Glenn [name] is an 68 y.o. male with atrial fibrillation on Eliquis, CHF, hepatitis C, HTN, recent cardiac defibrillator placement and prior stroke admitted with L side weakness, slurred speech, and altered  mental status. CT showed large subacute hermorrhagic infarct R MCA. CXR was unremarkable. BSE 09/07/16 revealed functional swallow and regular/thin liquids rec'd. Pt was given the [place] and RN reported coughing and choking, currently NPO.  Subjective: "want some water" Assessment / Plan / Recommendation CHL IP CLINICAL IMPRESSIONS 11/12/2018 Clinical Impression MBS revealed mild-moderate oropharyngeal dysphagia characterized by anterior spillage 2/2 L weakness and reduced epiglottic inversion that resulted in flash penetration with no instances of aspiration. Although epiglottis inversion is absent or reduced, there was enough constriction via pharyngeal peristalsis to prevent aspiration across all consistencies. Thin liquids were swallowed quickly with appropriate timing compared to nectar thick which resulted in a delayed, uncoordinated swallow and greater pharyngeal  residue. Mild-mod prolonged mastication and transit with regular solids and significant barium in left lateral sulci. He was receptive to chewing on his stronger R side and scanning for pocketing on his weak side. Pt was unable to swallow pill given thins or puree and expectorated. Recommend Dysphagia 2 diet with thin liquids, straws allowed, meds crushed in puree and cueing for compensatory strategies of R side chewing and lingual sweeping for L pocketing.    SLP Visit Diagnosis Dysphagia, oropharyngeal phase (R13.12) Attention and concentration deficit following -- Frontal lobe and executive function deficit following -- Impact on safety and function Mild aspiration risk   CHL IP TREATMENT RECOMMENDATION 11/12/2018 Treatment Recommendations Therapy as outlined in treatment plan below   Prognosis 11/12/2018 Prognosis for Safe Diet Advancement Good Barriers to Reach Goals -- Barriers/Prognosis Comment -- CHL IP DIET RECOMMENDATION 11/12/2018 SLP Diet Recommendations Dysphagia 2 (Fine chop) solids;Thin liquid Liquid Administration via Cup;Straw Medication  Administration Crushed with puree Compensations Monitor for anterior loss;Lingual sweep for clearance of pocketing Postural Changes Seated upright at 90 degrees   CHL IP OTHER RECOMMENDATIONS 11/12/2018 Recommended Consults -- Oral Care Recommendations Oral care BID Other Recommendations --   CHL IP FOLLOW UP RECOMMENDATIONS 11/12/2018 Follow up Recommendations Inpatient Rehab   CHL IP FREQUENCY AND DURATION 11/12/2018 Speech Therapy Frequency (ACUTE ONLY) min 2x/week Treatment Duration 2 weeks      CHL IP ORAL PHASE 11/12/2018 Oral Phase Impaired Oral - Pudding Teaspoon -- Oral - Pudding Cup -- Oral - Honey Teaspoon -- Oral - Honey Cup -- Oral - Nectar Teaspoon -- Oral - Nectar Cup Left anterior bolus loss Oral - Nectar Straw -- Oral - Thin Teaspoon -- Oral - Thin Cup Left anterior bolus loss Oral - Thin Straw Left anterior bolus loss Oral - Puree -- Oral - Mech Soft -- Oral - Regular Left anterior bolus loss;Delayed oral transit;Weak lingual manipulation Oral - Multi-Consistency -- Oral - Pill Weak lingual manipulation;Holding of bolus Oral Phase - Comment --  CHL IP PHARYNGEAL PHASE 11/12/2018 Pharyngeal Phase Impaired Pharyngeal- Pudding Teaspoon -- Pharyngeal -- Pharyngeal- Pudding Cup -- Pharyngeal -- Pharyngeal- Honey Teaspoon -- Pharyngeal -- Pharyngeal- Honey Cup -- Pharyngeal -- Pharyngeal- Nectar Teaspoon -- Pharyngeal -- Pharyngeal- Nectar Cup Pharyngeal residue - valleculae;Pharyngeal residue - pyriform;Reduced epiglottic inversion Pharyngeal -- Pharyngeal- Nectar Straw -- Pharyngeal -- Pharyngeal- Thin Teaspoon -- Pharyngeal -- Pharyngeal- Thin Cup Reduced epiglottic inversion;Penetration/Aspiration during swallow Pharyngeal Material enters airway, remains ABOVE vocal cords then ejected out Pharyngeal- Thin Straw Reduced epiglottic inversion Pharyngeal -- Pharyngeal- Puree -- Pharyngeal -- Pharyngeal- Mechanical Soft -- Pharyngeal -- Pharyngeal- Regular Reduced epiglottic inversion;Pharyngeal residue -  pyriform;Pharyngeal residue - valleculae Pharyngeal -- Pharyngeal- Multi-consistency -- Pharyngeal -- Pharyngeal- Pill NT Pharyngeal -- Pharyngeal Comment --  CHL IP CERVICAL ESOPHAGEAL PHASE 11/12/2018 Cervical Esophageal Phase WFL Pudding Teaspoon -- Pudding Cup -- Honey Teaspoon -- Honey Cup -- Nectar Teaspoon -- Nectar Cup -- Nectar Straw -- Thin Teaspoon -- Thin Cup -- Thin Straw -- Puree -- Mechanical Soft -- Regular -- Multi-consistency -- Pill -- Cervical Esophageal Comment -- [place] Siren 11/12/2018, 4:15 PM [name] Litaker M.Ed Actor Pager [phone] Office [phone]              Dg Hip Unilat With Pelvis 2-3 Views Right  Result Date: 11/13/2018 CLINICAL DATA:  Right hip pain. EXAM: DG HIP (WITH OR WITHOUT PELVIS) 2-3V RIGHT COMPARISON:  None. FINDINGS: There is no evidence of hip fracture or dislocation. There is no  evidence of arthropathy or other focal bone abnormality. IMPRESSION: Negative. Electronically Signed   By: Marijo Conception M.D.   On: 11/13/2018 15:05   Ct Head Code Stroke Wo Contrast  Result Date: 11/10/2018 CLINICAL DATA:  Code stroke.  Left-sided weakness slurred speech EXAM: CT HEAD WITHOUT CONTRAST TECHNIQUE: Contiguous axial images were obtained from the base of the skull through the vertex without intravenous contrast. COMPARISON:  CT head 09/07/2016 FINDINGS: Brain: Large area of cortical edema in the right frontal lobe. There is also involvement of the insula and basal ganglia which show abnormal hypodensity. Hyperdensity in the right head of caudate and anterior basal ganglia compatible with recent hemorrhage. Mild subfalcine herniation of right frontal lobe due to edema. Ventricle size normal.  No other acute infarct or mass Vascular: Negative for hyperdense vessel Skull: Negative Sinuses/Orbits: Mild mucosal edema paranasal sinuses. Negative orbit. Other: None ASPECTS ([place] Stroke Program Early CT Score) - Ganglionic level infarction  (caudate, lentiform nuclei, internal capsule, insula, M1-M3 cortex): 1 - Supraganglionic infarction (M4-M6 cortex): 2 Total score (0-10 with 10 being normal): 3 IMPRESSION: 1. Large territory subacute hemorrhagic infarct right MCA territory. Area of hemorrhage measures approximately 2 x 3 cm centered in the head of the caudate and putamen on the right. The blood likely is subacute. Local mass-effect and mild subfalcine herniation due to edema. 2. ASPECTS is 3 3. Emergent results communicated with Dr. Cheral Marker via text page. Electronically Signed   By: Franchot Gallo M.D.   On: 11/10/2018 18:13    Labs:  Basic Metabolic Panel: Recent Labs  Lab 11/28/18 0551 12/01/18 0726 12/02/18 1528  NA 139 139 137  K 4.3 4.3 4.9  CL 103 104 102  CO2 24 25 23   GLUCOSE 110* 115* 149*  BUN 16 11 15   CREATININE 1.05 1.07 1.39*  CALCIUM 8.6* 9.0 9.2    CBC: Recent Labs  Lab 11/28/18 0551 12/01/18 0726 12/02/18 1528  WBC 4.7 4.7 11.3*  NEUTROABS 2.7 2.4  --   HGB 11.3* 11.0* 12.6*  HCT 33.6* 33.1* 38.0*  MCV 94.6 95.7 95.7  PLT 188 220 276    CBG: No results for input(s): GLUCAP in the last 168 hours.  Family history.  Mother with hypertension.  Father with hypertension.  Maternal aunt with CVA.  Denies diabetes mellitus or colon cancer  Brief HPI:   Glenn [name] is a 68 y.o. right-handed male with history of atrial fibrillation maintained on Eliquis status post ICD as well as recent cardioversion [id number], diastolic congestive heart failure with ejection fraction 15 to 20%, hepatitis C, hypertension and prior left MCA infarction 2018 with thrombectomy of left M2 occlusion, tobacco abuse as well as remote history of cocaine and medical noncompliance.  Per chart review lives with her roommate 1 level home reportedly independent working as a Dealer.  He has a son in the area.  Presented 11/10/2018 with left hemiparesis and facial droop.  Noted BUN 1.54, COVID negative.  Cranial CT scan showed  large right MCA subacute hemorrhage.  Area of hemorrhage measuring approximately 2 x 3 cm centered in the head of the caudate and putamen on the right.  Patient did not receive TPA.  CT angiogram of head and neck showed subtotal occlusion right M1 segment with irregularity most consistent with thrombus.  Patient's Eliquis was reversed with Kcentra.  Echocardiogram with ejection fraction 15 to 20%.  Neurology follow-up initially maintained on hypertonic saline later discontinued.  Follow-up cardiology service for nonischemic cardiomyopathy  as well as acute on chronic diastolic congestive heart failure maintained on Coreg, Lasix, Aldactone as well as Entresto.  Patient currently remains off anticoagulation however subcutaneous heparin was initiated for DVT prophylaxis 11/11/2018.  Patient was admitted for a comprehensive rehab program.   Hospital Course: Glenn [name] was admitted to rehab (Not on file) for inpatient therapies to consist of PT, ST and OT at least three hours five days a week. Past admission physiatrist, therapy team and rehab RN have worked together to provide customized collaborative inpatient rehab.  Patient was slow but progressive gains during his rehabilitation stay.  Pertaining to right MCA infarction hemorrhagic conversion embolic secondary to known atrial fibrillation he had been on Eliquis since discontinued due to hemorrhagic conversion.  He was followed by neurology services.  He was maintained on subcutaneous heparin initiated 11/11/2018 for DVT prophylaxis changed to Lovenox 11/14/2018 no bleeding episodes.  Mood attention placed on Provigil.  Pain managed with use of Neurontin 300 mg 3 times daily as well as tramadol as needed.  Depakote was added 125 mg twice daily as well as the addition of Topamax for headaches with good results.  Acute on chronic diastolic congestive heart failure Lasix 40 mg daily latest chest x-ray showed no signs of fluid overload.  He continued on Coreg,  Aldactone and Entresto for hypertension as well as history of nonischemic cardiomyopathy.  AKI latest creatinine 1.39.  Noted on the afternoon of 12/02/2018 patient with complaints of abdominal pain KUB completed that was negative he did receive a soapsuds enema with large bowel movement noted.  His oxygen saturations remained good at 99% room air he denied any chest pain.  Follow-up vital signs noted fever of 102 blood cultures were obtained as well as chemistries chest x-ray which was negative for active disease he was placed on intravenous cefepime empirically for suspect sepsis.  Follow-up CBC hemoglobin 12.6 hematocrit of 38 WBC 11,300.  Patient with ongoing bouts of tachypnea EKG completed showing A. fib RVR rhythm strip ran ventricular rate 184.  Rapid response was contacted patient did receive 1 dose of IV Lopressor.  He had also been placed on gentle IV fluids.  He did receive morphine x1 for pain.  Due to ongoing cardiac changes per EKG [facility] hospitalist was contacted patient was discharged to acute care services in guarded condition ongoing monitoring 12/02/2018.  A CT of the abdomen was ordered prior to patient's discharge from rehab services concern for retroperitoneal bleed results were pending.   Blood pressures were monitored on TID basis and remained variable  He is continent of bowel and bladder with routine toileting.  He has made gains during rehab stay and is attending therapies up through 12/02/2018  He/ will continue to receive follow up therapies after discharge  Rehab course: During patient's stay in rehab weekly team conferences were held to monitor patient's progress, set goals and discuss barriers to discharge. At admission, patient required moderate assist 65 feet rolling walker, minimal guard sit to stand, minimal assist supine to sit.  Minimal assist upper body bathing max assist lower body bathing moderate assist upper body dressing max is lower body dressing  He  has had  improvement in activity tolerance, balance, postural control as well as ability to compensate for deficits. He has had improvement in functional use RUE/LUE  and RLE/LLE as well as improvement in awareness.  Therapy team is been working with energy conservation techniques.  Patient was performing supine to sit with moderate assist for  lower extremity and trunk management and a flat bed without use of bed rails.  Perform squat pivot transfer with mod max assist from bed to wheelchair with cues for hand placement.  Limited endurance noted.  He perform sit to from stand x1 with the [facility] with minimal assist standing from the wheelchair in the seat.  Physical therapy assisted patient with bathing and dressing performed bathing and wheelchair with total assist and upper body and lower body dressing with total assist including pulling pants and new briefs up in standing position.  Attempted to have patient stand with rolling walker and left hand splint needing mod to total assist.  Patient need frequent redirection with some perseveration.       Disposition: Discharged to acute care services    Diet: To be determined  Special Instructions: All medication changes made at the discretion of [facility] hospitalist   Allergies as of 12/02/2018      Reactions   Benadryl [diphenhydramine] Palpitations      Medication List    Notice   Cannot display discharge medications because the patient has not yet been admitted.      Signed: Lavon Paganini Rhen Kawecki 12/02/2018, 7:14 PM

## 2018-12-02 NOTE — Significant Event (Signed)
Rapid Response Event Note  Overview:  Met pt on 2C from CT, transferred from [place] with abd pain, tachypnea, Afib RVR.     Initial Focused Assessment: Pt with labored breathing. Oriented to self and place. Able to follow commands. Tenderness and guarding to RLQ. HR 150-170 (afib), BP 121/78, RR 50, spO2 95% on 5L [place]. Lungs with wheezing in upper lobes, not moving a lot of air and diminished in bases.   Interventions: abg Neb treatment Labs PIV and midline placed Metoprolol IV Cardizem gtt 23mg Fentanyl IV CCM consulted  Plan of Care (if not transferred): Pt transferred to 3M05  Event Summary:  Arrived at  1905   Event ended at 2130        NSherilyn Dacosta

## 2018-12-02 NOTE — Progress Notes (Signed)
PHYSICAL MEDICINE & REHABILITATION PROGRESS NOTE   Subjective/Complaints:  Pt reports feeling blessed- denies HA; denies any issues; feels "awake".   ROS: limited due to cognition   Objective:   Dg Abd 1 View  Result Date: 12/02/2018 CLINICAL DATA:  Right side abdominal pain and nausea for 1 week. EXAM: ABDOMEN - 1 VIEW COMPARISON:  None. FINDINGS: The bowel gas pattern is normal. No radio-opaque calculi or other significant radiographic abnormality are seen. IMPRESSION: Negative exam. Electronically Signed   By: Inge Rise M.D.   On: 12/02/2018 13:09   Recent Labs    12/01/18 0726  WBC 4.7  HGB 11.0*  HCT 33.1*  PLT 220   Recent Labs    12/01/18 0726  NA 139  K 4.3  CL 104  CO2 25  GLUCOSE 115*  BUN 11  CREATININE 1.07  CALCIUM 9.0    Intake/Output Summary (Last 24 hours) at 12/02/2018 1314 Last data filed at 12/02/2018 0848 Gross per 24 hour  Intake 240 ml  Output 500 ml  Net -260 ml     Physical Exam: Vital Signs Blood pressure 110/68, pulse 80, temperature 97.8 F (36.6 C), temperature source Oral, resp. rate 16, height 5\' 7"  (1.702 m), weight 86.4 kg, SpO2 98 %. Constitutional: No distress . Vital signs, labs and  nursing notes reviewed. Sitting up in bed; just getting set up for breakfast/assistance from NT, is answering questions no spontaneous speech,   NAD HENT: Normocephalic.  Atraumatic. Eyes: EOMI. No discharge.  Left eye stye continues to improve Cardiovascular: regular rate; intermittently irregular Respiratory: Normal effort.  CTA B/L-  GI: Non-distended. NT, (+)BS, soft Skin: Warm and dry.  Intact. Psych: more awake, eating, sitting upright Musc: No edema in extremities.  No tenderness in extremities. Neurological:  Alert Left facial weakness Speech dysarthria  Follows simple commands with cues- sometimes max cues Right upper extremity: 5/5 proximal distal Right lower extremity: 4-4+/5 proximal distal Left upper  extremity: 0/5 proximal to distal Left lower extremity: 3- HF, 2- Knee ext 0/5 Left foot  Neck - mild tenderness left cervical paraspinal - improved Assessment/Plan: 1. Functional deficits secondary to R MCA infarct with L hemiparesis which require 3+ hours per day of interdisciplinary therapy in a comprehensive inpatient rehab setting.  Physiatrist is providing close team supervision and 24 hour management of active medical problems listed below.  Physiatrist and rehab team continue to assess barriers to discharge/monitor patient progress toward functional and medical goals  Care Tool:  Bathing  Bathing activity did not occur: Safety/medical concerns Body parts bathed by patient: Left arm, Chest, Abdomen, Left upper leg, Right lower leg   Body parts bathed by helper: Right arm, Left arm, Front perineal area, Buttocks, Right upper leg, Left upper leg, Right lower leg, Left lower leg     Bathing assist Assist Level: Maximal Assistance - Patient 24 - 49%     Upper Body Dressing/Undressing Upper body dressing   What is the patient wearing?: Pull over shirt    Upper body assist Assist Level: Total Assistance - Patient < 25%    Lower Body Dressing/Undressing Lower body dressing      What is the patient wearing?: Pants, Incontinence brief     Lower body assist Assist for lower body dressing: 2 Helpers     Toileting Toileting    Toileting assist Assist for toileting: Total Assistance - Patient < 25%     Transfers Chair/bed transfer  Transfers assist  Chair/bed transfer assist level: Maximal Assistance - Patient 25 - 49%     Locomotion Ambulation   Ambulation assist   Ambulation activity did not occur: Safety/medical concerns(unsafe due to inability to stand; letheray and low BP)  Assist level: 2 helpers Assistive device: Other (comment)(3 musketeer method) Max distance: 16'   Walk 10 feet activity   Assist  Walk 10 feet activity did not occur:  Safety/medical concerns  Assist level: 2 helpers Assistive device: Walker-rolling   Walk 50 feet activity   Assist Walk 50 feet with 2 turns activity did not occur: Safety/medical concerns         Walk 150 feet activity   Assist Walk 150 feet activity did not occur: Safety/medical concerns         Walk 10 feet on uneven surface  activity   Assist Walk 10 feet on uneven surfaces activity did not occur: Safety/medical concerns         Wheelchair     Assist Will patient use wheelchair at discharge?: Yes Type of Wheelchair: Manual    Wheelchair assist level: Maximal Assistance - Patient 25 - 49% Max wheelchair distance: 75    Wheelchair 50 feet with 2 turns activity    Assist        Assist Level: Maximal Assistance - Patient 25 - 49%   Wheelchair 150 feet activity     Assist      Assist Level: Dependent - Patient 0%   Blood pressure 110/68, pulse 80, temperature 97.8 F (36.6 C), temperature source Oral, resp. rate 16, height 5\' 7"  (1.702 m), weight 86.4 kg, SpO2 98 %.  1.  Left-sided weakness with facial droop and dysphagia secondary to large right MCA infarction with hemorrhagic conversion, embolic secondary to known A. fib on Eliquis as well as history of left MCA infarction 2018 status post thrombectomy for left M2 occlusion.  Anticoagulation on hold due to hemorrhage  Cont CIR PT, OT, SLP   930- will try Provigil for attention- can't start Ritalin per QT prolongation nor Amantadine for same reason  10/6- will increase Provigil for AM-for attention- still somnolent in ams   10/9- needs more assistance, but nothing is available.  2.  Antithrombotics: -DVT/anticoagulation: Subcutaneous heparin initiated 11/11/2018  9/25- changed to Lovenox             Monitor for bleeding             -antiplatelet therapy: N/A 3. Pain Management: Neurontin 300 mg 3 times daily,tramadol as needed Kpad and trolamine for neck pain   9/25- Added Depakote 125  mg BID will d/c and observe   10/9- will add Topamax 25 mg QHS and maintain Depakote for now.   10/12- HA gone x 24 hours 4. Mood: Valium 5 mg twice daily as needed anxiety             -antipsychotic agents: N/A 5. Neuropsych: This patient is not fully capable of making decisions on his own behalf. 6. Skin/Wound Care: Routine skin checks 7. Fluids/Electrolytes/Nutrition: Routine I/Os 8.  Atrial fibrillation status post ICD.  Follow cardiology services.  Patient with recent cardioversion.  Cardiac rate controlled 9.  Acute on chronic diastolic congestive heart failure.  Lasix 40 mg daily.  Monitor for any signs of fluid overload             Daily weights needed   Filed Weights   11/30/18 0348 12/01/18 0500 12/02/18 0504  Weight: 86.6 kg 85.1 kg 86.4 kg  Stable on 10/11  10/9- weight down 1+ kg- CXR looks ok- BUN/Cr great- BNP 916- will give 1 dose Lasix 40 mg x1. 10.  Nonischemic cardiomyopathy.  Ejection fraction 15 to 20%.             See #9 10/8- Weight up significantly- will make sure off IVFs, and check CXR in AM for lfuid overload.  11.  Hypertension.  Cont Coreg 12.5 mg twice daily, Aldactone 12.5 mg daily, Entresto 24-26 mg twice daily Today's Vitals   12/01/18 2339 12/02/18 0504 12/02/18 0808 12/02/18 1126  BP:  104/68  110/68  Pulse:  99  80  Resp:  18  16  Temp:  97.8 F (36.6 C)    TempSrc:  Oral    SpO2:  98%  98%  Weight:  86.4 kg    Height:      PainSc: 1   0-No pain    Body mass index is 29.83 kg/m.;  12.  Hyperlipidemia.  Lipitor 13.  Post stroke dysphagia.  Dysphasia #1 thin liquids.  Follow-up speech therapy 14.  AKI.  Creatinine 1.23-1.54             Creatinine 1.31 on 10/5, continue to monitor  10/6- will restart IVFs NS 60cc/hr since per nursing, not drinking well  10/7- will recheck labs in AM  10/12- Cr 1.07 and BUN great at 11- no more IVFs needed right now. 15.  History of tobacco/marijuana abuse as well as history of remote cocaine use.   Provide counseling 16.  Medical noncompliance.  Counseling 17.  Chronic hep C.  Follow-up outpatient 18. R trochanteric bursitis/R groin pain/Hip pain-  9/25- ordered lidocaine patch for R lateral hip  -ROM/Ice 19. Fever:  Improving, but has received Tylenol  -left eye doesn't appear infected but given temp, treating for blepharitis for now pending urine and chest  -continue to monitor  Continue Rocephin for Klebsiella UTI  10/6- nursing concerned about infection- on Rocephin til 10/8- NO OTHER SIGNS of infection- WBC nml, no left shift; CXR (-).  20 . Poor initiation  9/29- cannot start Amantadine due to severely prolonged QT interval- will have to wait on med changes.  9/30 added Provigil 100 mg daily  10/1- changed to 6am so more awake for day- did help this AM  10/2- still an issue- can't start meds due to QT prolongation.  10/6- will increase to 200 mg daily. 21.  Labile blood glucose  Continue to monitor 22.  Acute blood loss anemia  Hemoglobin 12.6 on 10/5  Continue to monitor  23. Neurogenic bladder  10/7- in/out caths becoming painful- will order coude' cath q6 hours prn with lidocaine as needed  LOS: 19 days A FACE TO FACE EVALUATION WAS PERFORMED  Glendale Wherry 12/02/2018, 1:14 PM

## 2018-12-02 NOTE — Progress Notes (Signed)
Speech Language Pathology Daily Session Note  Patient Details  Name: Glenn [name] MRN: [id number] Date of Birth: Feb 07, 1951  Today's Date: 12/02/2018 SLP Individual Time: 2300-2344 SLP Individual Time Calculation (min): 44 min  Short Term Goals: Week 3: SLP Short Term Goal 1 (Week 3): Pt will consume current dysphagia 1 solid, thin liquid diet with min overt s/s aspiration, efficient mastication and oral clearance with Min A verbal cues for use of swallow stategies. SLP Short Term Goal 2 (Week 3): Pt will maintain alertness with Min A and demonstrate efficient mastication and oral clearance with upgraded dysphagia 2 solid trials prior to solid advancement. SLP Short Term Goal 3 (Week 3): Pt will demonstate basic problem solving during familiar functional tasks with Mod A verbal/visual cues. SLP Short Term Goal 4 (Week 3): Pt will sustain attention for ~5 minutes with Mod A cues. SLP Short Term Goal 5 (Week 3): Pt will recall daily information with Min A for use of compensatory memory strategies. SLP Short Term Goal 6 (Week 3): Pt will increase speech intelligibility to ~80% at the sentence level and Min A for use of clear speech strategies.  Skilled Therapeutic Interventions: Pt was seen for skilled ST targeting dysphagia and cognitive goals. Pt was initially bright and engaged in appropriate simple conversation with therapist upon arrival. SLP provided skilled observation of both dysphagia 1 (puree) and dysphagia 2 (minced) solids to assess oral clearance and use of strategies with both textures. Pt demonstrated efficient mastication and oral clearance with Mod A verbal cues for use of liquid wash and lingual sweep to clear left buccal pocketing. Min-Mod anterior loss of dys 1 noted, although no anterior loss noted with dys 2. Given improved ability to maintain alertness and efficient oral clearance when cued, SLP will assess pt with dysphagia 2 breakfast meal in the AM (10/14) to assess  readiness for full upgrade. SLP also facilitated session with Max A verbal and visual cues for basic problem solving, Mod A verbal cues for visual scanning during a novel card game (11 up). Pt was limited by pain during structured cognitive tasks. His reports of pain orientation were inconsistent (chest vs abdomen). Audible respirations noted by SLP, pt began groaning and grimacing and stated "something's not right." SLP alerted RN who took vitals and contact PA for further assessment. As a result, pt missed 15 minutes of skilled ST. Pt was left sitting in wheelchair with RN and PA present. Continue per current plan of care.      Pain Pain Assessment Pain Scale: Faces Faces Pain Scale: Hurts whole lot Pain Type: Acute pain Pain Location: Other (Comment)(Unclear whether chest or abdominal pain) Pain Orientation: Mid Pain Descriptors / Indicators: Squeezing;Tightness Pain Onset: Sudden Pain Intervention(s): RN made aware;MD notified (Comment);Emotional support Multiple Pain Sites: No  Therapy/Group: Individual Therapy  Arbutus Leas 12/02/2018, 12:25 PM

## 2018-12-02 NOTE — Progress Notes (Signed)
Administered soap suds enema per Marlowe [place], PA instruction.  Extremely large amount of brown liquidy BM returned along with bits of harder stool.  Enema very effective.  Patient states his belly pain is much improved.  Complete linen change completed and patient left sitting up in bed, call bell within reach, with ST coming in to see patient.  Notified PA of results.  Will continue to monitor.  Brita Romp, RN

## 2018-12-02 NOTE — Progress Notes (Signed)
Physical Therapy Note  Patient Details  Name: Glenn [name] MRN: [id number] Date of Birth: 02/01/1951 Today's Date: 12/02/2018    Patient in bed with labored breathing upon PT arrival. Assessed vitals and called RN. Vitals: 130/101, HR 34 (irregular), SPO2 99%. RN and PA reported patient has had abdominal pain all day and patient is currently being assessed to determine cause of pain and labored breathing. Patient missed 45 min of skilled PT due to medical hold. Will attempt to make up therapy time as able when patient is medically appropriate.    Ardyn Forge L Vanna Shavers PT, DPT  12/02/2018, 1:24 PM

## 2018-12-02 NOTE — Progress Notes (Signed)
rec'd pt from rehab to 2c02 rapid response at bedside, RT at bedside to draw ABG. Oxygen at 5l/min applied, pt tachypnic, hr 160's,

## 2018-12-03 ENCOUNTER — Inpatient Hospital Stay (HOSPITAL_COMMUNITY): Payer: Medicare HMO

## 2018-12-03 ENCOUNTER — Other Ambulatory Visit: Payer: Self-pay | Admitting: Pulmonary Disease

## 2018-12-03 ENCOUNTER — Inpatient Hospital Stay (HOSPITAL_COMMUNITY): Payer: Medicare HMO | Admitting: Speech Pathology

## 2018-12-03 DIAGNOSIS — R609 Edema, unspecified: Secondary | ICD-10-CM | POA: Diagnosis not present

## 2018-12-03 DIAGNOSIS — I4891 Unspecified atrial fibrillation: Secondary | ICD-10-CM

## 2018-12-03 LAB — MAGNESIUM: Magnesium: 1.8 mg/dL (ref 1.7–2.4)

## 2018-12-03 LAB — BASIC METABOLIC PANEL
Anion gap: 9 (ref 5–15)
BUN: 17 mg/dL (ref 8–23)
CO2: 24 mmol/L (ref 22–32)
Calcium: 8.6 mg/dL — ABNORMAL LOW (ref 8.9–10.3)
Chloride: 105 mmol/L (ref 98–111)
Creatinine, Ser: 1.59 mg/dL — ABNORMAL HIGH (ref 0.61–1.24)
GFR calc Af Amer: 52 mL/min — ABNORMAL LOW (ref >60)
GFR calc non Af Amer: 45 mL/min — ABNORMAL LOW (ref >60)
Glucose, Bld: 139 mg/dL — ABNORMAL HIGH (ref 70–99)
Potassium: 5.2 mmol/L — ABNORMAL HIGH (ref 3.5–5.1)
Sodium: 138 mmol/L (ref 135–145)

## 2018-12-03 LAB — CREATININE, SERUM
Creatinine, Ser: 1.55 mg/dL — ABNORMAL HIGH (ref 0.61–1.24)
GFR calc Af Amer: 53 mL/min — ABNORMAL LOW (ref >60)
GFR calc non Af Amer: 46 mL/min — ABNORMAL LOW (ref >60)

## 2018-12-03 LAB — MRSA PCR SCREENING: MRSA by PCR: NEGATIVE

## 2018-12-03 LAB — GLUCOSE, CAPILLARY: Glucose-Capillary: 119 mg/dL — ABNORMAL HIGH (ref 70–99)

## 2018-12-03 LAB — TROPONIN I (HIGH SENSITIVITY): Troponin I (High Sensitivity): 25 ng/L — ABNORMAL HIGH (ref <18)

## 2018-12-03 MED ORDER — MORPHINE SULFATE (PF) 2 MG/ML IV SOLN
2.0000 mg | INTRAVENOUS | Status: DC | PRN
  Administered 2018-12-03 – 2018-12-09 (×15): 2 mg via INTRAVENOUS
  Filled 2018-12-03 (×17): qty 1

## 2018-12-03 MED ORDER — MAGNESIUM SULFATE 2 GM/50ML IV SOLN
2.0000 g | Freq: Once | INTRAVENOUS | Status: AC
  Administered 2018-12-03: 11:00:00 2 g via INTRAVENOUS
  Filled 2018-12-03: qty 50

## 2018-12-03 MED ORDER — ENOXAPARIN SODIUM 40 MG/0.4ML ~~LOC~~ SOLN: 40.0000 mg | SUBCUTANEOUS | Status: DC

## 2018-12-03 MED ORDER — CHLORHEXIDINE GLUCONATE 0.12 % MT SOLN
15.0000 mL | Freq: Two times a day (BID) | OROMUCOSAL | Status: DC
  Administered 2018-12-03 – 2018-12-24 (×41): 15 mL via OROMUCOSAL
  Filled 2018-12-03 (×31): qty 15

## 2018-12-03 MED ORDER — VANCOMYCIN HCL 10 G IV SOLR
1250.0000 mg | INTRAVENOUS | Status: DC
  Filled 2018-12-03: qty 1250

## 2018-12-03 MED ORDER — ORAL CARE MOUTH RINSE
15.0000 mL | Freq: Two times a day (BID) | OROMUCOSAL | Status: DC
  Administered 2018-12-03 – 2018-12-23 (×36): 15 mL via OROMUCOSAL

## 2018-12-03 MED ORDER — FENTANYL CITRATE (PF) 100 MCG/2ML IJ SOLN
25.0000 ug | INTRAMUSCULAR | Status: DC | PRN
  Administered 2018-12-03 (×3): 25 ug via INTRAVENOUS
  Filled 2018-12-03 (×3): qty 2

## 2018-12-03 MED FILL — Diltiazem HCl IV Soln 125 MG/25ML (5 MG/ML): INTRAVENOUS | Qty: 25 | Status: AC

## 2018-12-03 MED FILL — Dextrose Inj 5%: INTRAVENOUS | Qty: 100 | Status: AC

## 2018-12-03 NOTE — Progress Notes (Signed)
Pharmacy Antibiotic Note  Glenn [name] is a 68 y.o. male transferred from [place] on 12/02/2018 with sepsis (unknown source).  Pharmacy has been consulted for vancomycin and Zosyn dosing - day #2. SCr trend back down 1.66>1.55.  Plan: Zosyn 3.375 gm IV Q 8 hrs (extended infusion) Adjust vancomycin to 1250 mg IV Q 24 hrs. Goal AUC 400-550. Expected AUC: 493 SCr used: 1.55 Monitor clinical progress, c/s, renal function F/u de-escalation plan/LOT, vancomycin levels as indicated    Temp (24hrs), Avg:100.3 F (37.9 C), Min:99 F (37.2 C), Max:102.8 F (39.3 C)  Recent Labs  Lab 11/28/18 0551 12/01/18 0726 12/02/18 1528 12/02/18 2008 12/02/18 2018 12/02/18 2209 12/02/18 2220 12/03/18 0225  WBC 4.7 4.7 11.3*  --  15.6*  --   --   --   CREATININE 1.05 1.07 1.39*  --   --  1.66*  --  1.55*  LATICACIDVEN  --   --   --  1.2  --   --  1.5  --     Estimated Creatinine Clearance: 49.2 mL/min (A) (by C-G formula based on SCr of 1.55 mg/dL (H)).    Allergies  Allergen Reactions  . Benadryl [Diphenhydramine] Palpitations    Elicia Lamp, PharmD, BCPS Please check AMION for all [facility] contact numbers Clinical Pharmacist 12/03/2018 8:50 AM

## 2018-12-03 NOTE — Progress Notes (Signed)
Bilateral lower extremity venous duplex completed. Refer to "CV Proc" under chart review to view preliminary results.  Critical results discussed with Evelena Peat, who will pass these findings on to attending MD.  12/03/2018 3:04 PM Maudry Mayhew, MHA, RVT, RDCS, RDMS

## 2018-12-03 NOTE — Progress Notes (Unsigned)
Informed about DVT  Patient cannot be fully anticoagulated secondary to recent hemorrhagic CVA  Atrial fibrillation DVT  Consult interventional radiology for filter placement  Creatinine is borderline

## 2018-12-03 NOTE — Progress Notes (Signed)
NAME:  Glenn [name], MRN:  [id number], DOB:  02/06/1951, LOS: 1 ADMISSION DATE:  12/02/2018, CONSULTATION DATE: 12/02/2018 REFERRING MD: Dr. Blaine Hamper, CHIEF COMPLAINT: Respiratory distress  Brief History   68 year old gentleman transferred from [facility] with fever, A. fib with RVR, abdominal pain, respiratory distress Consult for medical management Recent CVA  History of present illness   68 year old male with past medical history significant for hypertension, hyperlipidemia, A. Fib previously on eliquis, HFrEF with EF 15-20% s/p ICD and cardioversion 10/29/2018, Hep C, CKD stage III, who most recently being admitted on 9/21 with left hemiparessis found to have large right MCA subacute hemorrhage. Additionally found to have acute nonischemic cardiomyopathy with EF 15-20% s/p ICD placement and cardioversion 9/9 for Afib.  Discharge 10/13  Did have a fever of 102, respiratory discomfort post enema on 1013 CT scan of the abdomen was unremarkable  On empiric antibiotics-Zosyn and vancomycin  On nasal cannula at present  Past Medical History   Past Medical History:  Diagnosis Date  . Atrial fibrillation ([place])   . CHF (congestive heart failure) ([place])   . Hepatitis C   . Hypertension   . Stroke ([place])   . Visit for monitoring Tikosyn therapy 03/26/2017   Significant Hospital Events   Acute respiratory failure 12/02/2018-initiated rapid response and transferred to the ICU  Consults:  PCCM on 12/02/2018  Procedures:    Significant Diagnostic Tests:  12/02/2018 CTH a/p >> 1. No evidence of retroperitoneal hematoma. 2. Small right pleural effusion layering dependently. Mild atelectasis or infiltrate at the right lung base. 3. Aortic atherosclerosis. 4. Gallstones in the gallbladder but no CT evidence of cholecystitis. 5. No intraperitoneal abscess. No acute bowel pathology evident. Aortic Atherosclerosis   Micro Data:  10/13 blood culture x2>> 10/13 urine culture>>   Antimicrobials:  10/8 ceftriaxone completed  10/13 cefepime 10/13 zosyn >> 10/13 vanc >> Interim history/subjective:  On nasal cannula Overall mental status appears to be better Complaining of some abdominal discomfort but, does admit that he feels better than yesterday  Objective   Blood pressure 128/74, pulse 97, temperature 100.3 F (37.9 C), temperature source Axillary, resp. rate (!) 24, SpO2 100 %.    FiO2 (%):  [40 %] 40 %   Intake/Output Summary (Last 24 hours) at 12/03/2018 1000 Last data filed at 12/03/2018 0900 Gross per 24 hour  Intake 1259.12 ml  Output 260 ml  Net 999.12 ml   There were no vitals filed for this visit.  Examination: General: Acutely ill-appearing, slight increase in work of breathing HENT: Moist mucosa, pupils reactive Lungs: Decreased air movement bilaterally, some rhonchi at the bases Cardiovascular: S1-S2 appreciated Abdomen: Soft, does not appear tender, bowel sounds appreciated Extremities: Edema Neuro: Alert and oriented, decreased strength left upper extremity GU: [facility] Problem list     Assessment & Plan:  Marland Kitchen  Acute respiratory failure -Concern for aspiration pneumonia-start on antibiotics -Chest x-ray is unremarkable, some congestion -Tolerated BiPAP well and was transitioned to nasal cannula -These hypoventilating -Continue bronchodilator treatments -Obtain ultrasound of the lower extremity rule out DVT  .  Possible aspiration -We will ask speech therapy to continue following -We will continue antibiotic, de-escalate, discontinue vancomycin  .  SIRS -Increased leukocytosis may be stress response -CT abdomen did not reveal any acute injury .  A. fib with rapid ventricular response -Not on anticoagulation secondary to recent hemorrhagic stroke -On diltiazem for rate control -Monitor electrolytes  .  Nonischemic cardiomyopathy with ejection fraction 15  to 25% -ICD in place -No acute EKG changes   Acute kidney injury on chronic kidney disease stage III -Maintain Foley -Trend electrolytes -Avoid nephrotoxic's -Continue to ensure adequate renal perfusion by maintaining MAP greater than 65   History of MCA hemorrhagic stroke with residual facial droop, left-sided hemiparesis and dysphagia -Neuro exams -Continue speech evaluation and management -Appreciate speech involvement  History of hepatitis C -Outpatient follow-up  Best practice:  Diet: N.p.o. Pain/Anxiety/Delirium protocol (if indicated): Fentanyl PRN VAP protocol (if indicated): Not applicable DVT prophylaxis: Lovenox GI prophylaxis: Not applicable Glucose control: Monitor glucose Mobility: may be out of bed to chair Code Status: Full code Family Communication: Will update Disposition: ICU  Labs   CBC: Recent Labs  Lab 11/28/18 0551 12/01/18 0726 12/02/18 1528 12/02/18 2018 12/02/18 2202  WBC 4.7 4.7 11.3* 15.6*  --   NEUTROABS 2.7 2.4  --  12.3*  --   HGB 11.3* 11.0* 12.6* 12.3* 10.9*  HCT 33.6* 33.1* 38.0* 36.1* 32.0*  MCV 94.6 95.7 95.7 94.5  --   PLT 188 220 276 278  --     Basic Metabolic Panel: Recent Labs  Lab 11/28/18 0551 12/01/18 0726 12/02/18 1528 12/02/18 2202 12/02/18 2209 12/03/18 0225 12/03/18 0856  NA 139 139 137 137 136  --  138  K 4.3 4.3 4.9 5.0 6.1*  --  5.2*  CL 103 104 102  --  103  --  105  CO2 24 25 23   --  22  --  24  GLUCOSE 110* 115* 149*  --  136*  --  139*  BUN 16 11 15   --  17  --  17  CREATININE 1.05 1.07 1.39*  --  1.66* 1.55* 1.59*  CALCIUM 8.6* 9.0 9.2  --  8.5*  --  8.6*  MG  --   --   --   --   --  1.8  --    GFR: Estimated Creatinine Clearance: 48 mL/min (A) (by C-G formula based on SCr of 1.59 mg/dL (H)). Recent Labs  Lab 11/28/18 0551 12/01/18 0726 12/02/18 1528 12/02/18 2008 12/02/18 2018 12/02/18 2209 12/02/18 2220  PROCALCITON  --   --   --   --   --  0.17  --   WBC 4.7 4.7 11.3*  --  15.6*  --   --   LATICACIDVEN  --   --   --  1.2  --    --  1.5    ABG    Component Value Date/Time   PHART 7.402 12/02/2018 2202   PCO2ART 38.3 12/02/2018 2202   PO2ART 447.0 (H) 12/02/2018 2202   HCO3 23.8 12/02/2018 2202   TCO2 25 12/02/2018 2202   ACIDBASEDEF 1.0 12/02/2018 2202   O2SAT 100.0 12/02/2018 2202     Past Medical History  He,  has a past medical history of Atrial fibrillation ([place]), CHF (congestive heart failure) ([place]), Hepatitis C, Hypertension, Stroke ([place]), and Visit for monitoring Tikosyn therapy (03/26/2017).   Surgical History    Past Surgical History:  Procedure Laterality Date  . CARDIAC DEFIBRILLATOR PLACEMENT  2015  . CARDIOVERSION N/A 10/10/2016   Procedure: CARDIOVERSION;  Surgeon: Dorothy Spark, MD;  Location: [facility] ENDOSCOPY;  Service: Cardiovascular;  Laterality: N/A;  . CARDIOVERSION N/A 03/27/2017   Procedure: CARDIOVERSION;  Surgeon: Jerline Pain, MD;  Location[facility] ENDOSCOPY;  Service: Cardiovascular;  Laterality: N/A;  . CARDIOVERSION N/A 10/29/2018   Procedure: CARDIOVERSION;  Surgeon: Sanda Klein, MD;  Location:  [place] ENDOSCOPY;  Service: Cardiovascular;  Laterality: N/A;  . CARDIOVERSION N/A 11/05/2018   Procedure: CARDIOVERSION;  Surgeon: Thayer Headings, MD;  Location: [place];  Service: Cardiovascular;  Laterality: N/A;  . EYE SURGERY Left 1990  . IR PERCUTANEOUS ART THROMBECTOMY/INFUSION INTRACRANIAL INC DIAG ANGIO  09/05/2016  . IR RADIOLOGIST EVAL & MGMT  10/03/2016  . RADIOLOGY WITH ANESTHESIA N/A 09/05/2016   Procedure: RADIOLOGY WITH ANESTHESIA;  Surgeon: Luanne Bras, MD;  Location: [place];  Service: Radiology;  Laterality: N/A;  . RIGHT/LEFT HEART CATH AND CORONARY ANGIOGRAPHY N/A 11/03/2018   Procedure: RIGHT/LEFT HEART CATH AND CORONARY ANGIOGRAPHY;  Surgeon: Lorretta Harp, MD;  Location: [facility];  Service: Cardiovascular;  Laterality: N/A;     Social History   reports that he has been smoking cigarettes. He has been smoking about 0.50 packs per day. He has  never used smokeless tobacco. He reports previous alcohol use of about 3.0 standard drinks of alcohol per week. He reports current drug use. Frequency: 2.00 times per week. Drug: Marijuana. He was never diagnosed with COPD  Family History   His family history includes High blood pressure in his father and mother; Stroke in his maternal aunt. There is no history of Heart disease.   The patient is critically ill with multiple organ systems failure and requires high complexity decision making for assessment and support, frequent evaluation and titration of therapies, application of advanced monitoring technologies and extensive interpretation of multiple databases. Critical Care Time devoted to patient care services described in this note independent of APP/resident time (if applicable)  is 40 minutes.   Sherrilyn Rist MD [facility] Personal pager: [phone] If unanswered, please page CCM On-call: [phone]

## 2018-12-03 NOTE — Progress Notes (Signed)
Inpatient Rehabilitation Admissions Coordinator  Patient admitted to CIR/inpt rehab on 11/13/2018. Noted medical decline and readmitted to acute hospital last night. I will follow his progress to assist as appropriate.  Danne Baxter, RN, MSN Rehab Admissions Coordinator [phone] 12/03/2018 8:47 AM

## 2018-12-04 ENCOUNTER — Encounter (HOSPITAL_COMMUNITY): Payer: Self-pay | Admitting: Physician Assistant

## 2018-12-04 ENCOUNTER — Inpatient Hospital Stay (HOSPITAL_COMMUNITY): Payer: Medicare HMO

## 2018-12-04 DIAGNOSIS — I82413 Acute embolism and thrombosis of femoral vein, bilateral: Secondary | ICD-10-CM

## 2018-12-04 HISTORY — PX: IR IVC FILTER PLMT / S&I /IMG GUID/MOD SED: IMG701

## 2018-12-04 LAB — BASIC METABOLIC PANEL
Anion gap: 12 (ref 5–15)
BUN: 16 mg/dL (ref 8–23)
CO2: 23 mmol/L (ref 22–32)
Calcium: 8.8 mg/dL — ABNORMAL LOW (ref 8.9–10.3)
Chloride: 103 mmol/L (ref 98–111)
Creatinine, Ser: 1.49 mg/dL — ABNORMAL HIGH (ref 0.61–1.24)
GFR calc Af Amer: 56 mL/min — ABNORMAL LOW (ref >60)
GFR calc non Af Amer: 48 mL/min — ABNORMAL LOW (ref >60)
Glucose, Bld: 138 mg/dL — ABNORMAL HIGH (ref 70–99)
Potassium: 4.5 mmol/L (ref 3.5–5.1)
Sodium: 138 mmol/L (ref 135–145)

## 2018-12-04 LAB — CBC
HCT: 33.5 % — ABNORMAL LOW (ref 39.0–52.0)
Hemoglobin: 11.2 g/dL — ABNORMAL LOW (ref 13.0–17.0)
MCH: 32.1 pg (ref 26.0–34.0)
MCHC: 33.4 g/dL (ref 30.0–36.0)
MCV: 96 fL (ref 80.0–100.0)
Platelets: 214 10*3/uL (ref 150–400)
RBC: 3.49 MIL/uL — ABNORMAL LOW (ref 4.22–5.81)
RDW: 12.5 % (ref 11.5–15.5)
WBC: 17.3 10*3/uL — ABNORMAL HIGH (ref 4.0–10.5)
nRBC: 0 % (ref 0.0–0.2)

## 2018-12-04 LAB — PROTIME-INR
INR: 1.4 — ABNORMAL HIGH (ref 0.8–1.2)
Prothrombin Time: 17.1 seconds — ABNORMAL HIGH (ref 11.4–15.2)

## 2018-12-04 LAB — URINE CULTURE: Culture: NO GROWTH

## 2018-12-04 MED ORDER — OSMOLITE 1.5 CAL PO LIQD
1000.0000 mL | ORAL | Status: DC
  Administered 2018-12-05 – 2018-12-07 (×3): 1000 mL
  Filled 2018-12-04 (×6): qty 1000

## 2018-12-04 MED ORDER — ACETAMINOPHEN 10 MG/ML IV SOLN
1000.0000 mg | Freq: Four times a day (QID) | INTRAVENOUS | Status: AC | PRN
  Administered 2018-12-04: 1000 mg via INTRAVENOUS
  Filled 2018-12-04: qty 100

## 2018-12-04 MED ORDER — AMIODARONE HCL IN DEXTROSE 360-4.14 MG/200ML-% IV SOLN
30.0000 mg/h | INTRAVENOUS | Status: DC
  Administered 2018-12-05 – 2018-12-07 (×6): 30 mg/h via INTRAVENOUS
  Filled 2018-12-04 (×5): qty 200

## 2018-12-04 MED ORDER — BISACODYL 10 MG RE SUPP
10.0000 mg | Freq: Every day | RECTAL | Status: DC | PRN
  Administered 2018-12-04: 18:00:00 10 mg via RECTAL
  Filled 2018-12-04: qty 1

## 2018-12-04 MED ORDER — IOHEXOL 300 MG/ML  SOLN
100.0000 mL | Freq: Once | INTRAMUSCULAR | Status: AC | PRN
  Administered 2018-12-04: 50 mL via INTRAVENOUS

## 2018-12-04 MED ORDER — PRO-STAT SUGAR FREE PO LIQD
30.0000 mL | Freq: Every day | ORAL | Status: DC
  Administered 2018-12-05 – 2018-12-07 (×3): 30 mL
  Filled 2018-12-04 (×3): qty 30

## 2018-12-04 MED ORDER — FENTANYL CITRATE (PF) 100 MCG/2ML IJ SOLN
INTRAMUSCULAR | Status: AC
  Filled 2018-12-04: qty 2

## 2018-12-04 MED ORDER — FUROSEMIDE 10 MG/ML IJ SOLN
40.0000 mg | Freq: Once | INTRAMUSCULAR | Status: AC
  Administered 2018-12-04: 40 mg via INTRAVENOUS
  Filled 2018-12-04: qty 4

## 2018-12-04 MED ORDER — AMIODARONE HCL IN DEXTROSE 360-4.14 MG/200ML-% IV SOLN
60.0000 mg/h | INTRAVENOUS | Status: DC
  Administered 2018-12-04 (×2): 60 mg/h via INTRAVENOUS
  Filled 2018-12-04 (×2): qty 200

## 2018-12-04 MED ORDER — LIDOCAINE HCL 1 % IJ SOLN
INTRAMUSCULAR | Status: AC | PRN
  Administered 2018-12-04: 10 mL

## 2018-12-04 MED ORDER — LIDOCAINE HCL 1 % IJ SOLN
INTRAMUSCULAR | Status: AC
  Filled 2018-12-04: qty 20

## 2018-12-04 MED ORDER — AMIODARONE LOAD VIA INFUSION
150.0000 mg | Freq: Once | INTRAVENOUS | Status: AC
  Administered 2018-12-04: 19:00:00 150 mg via INTRAVENOUS
  Filled 2018-12-04: qty 83.34

## 2018-12-04 MED ORDER — MAGNESIUM SULFATE 2 GM/50ML IV SOLN
2.0000 g | Freq: Once | INTRAVENOUS | Status: AC
  Administered 2018-12-04: 19:00:00 2 g via INTRAVENOUS
  Filled 2018-12-04: qty 50

## 2018-12-04 MED ORDER — ACETAMINOPHEN 500 MG PO TABS
1000.0000 mg | ORAL_TABLET | Freq: Four times a day (QID) | ORAL | Status: DC | PRN
  Administered 2018-12-04 – 2018-12-20 (×17): 1000 mg via ORAL
  Filled 2018-12-04 (×18): qty 2

## 2018-12-04 MED ORDER — FENTANYL CITRATE (PF) 100 MCG/2ML IJ SOLN
INTRAMUSCULAR | Status: AC | PRN
  Administered 2018-12-04: 50 ug via INTRAVENOUS

## 2018-12-04 NOTE — H&P (Signed)
Chief Complaint: DVT Intracranial hemorrhage  Referring Physician(s): Sherrilyn Rist   Supervising Physician: Jacqulynn Cadet  Patient Status: [facility] - In-pt  History of Present Illness: Glenn [name] is a 68 y.o. male with past medical history significant forhypertension, hyperlipidemia, A. Fibpreviously on eliquis,HFrEF with EF 15-20% s/p ICD and cardioversion 10/29/2018, Hep C, CKD stage III.  He was most recently being admitted on 9/21 with left hemiparessis found to have large right MCA subacute hemorrhage.   Additionally found to have acute nonischemic cardiomyopathy with EF 15-20% s/p ICD placement and cardioversion 9/9 for Afib.  Venous doppler done yesterday showed = Findings consistent with acute deep vein thrombosis involving the left common femoral vein, left femoral vein, left popliteal vein, left proximal profunda vein, left posterior tibial veins, left peroneal veins, and left gastrocnemius veins. No cystic structure found in the popliteal fossa.  We are asked to place an inferior vena cava filter since he cannot be anticoagulated due to recent intracranial hemorrhage.  Past Medical History:  Diagnosis Date   Atrial fibrillation [facility])    CHF (congestive heart failure) ([place])    Hepatitis C    Hypertension    Stroke [facility])    Visit for monitoring Tikosyn therapy 03/26/2017    Past Surgical History:  Procedure Laterality Date   CARDIAC DEFIBRILLATOR PLACEMENT  2015   CARDIOVERSION N/A 10/10/2016   Procedure: CARDIOVERSION;  Surgeon: Dorothy Spark, MD;  Location: [place];  Service: Cardiovascular;  Laterality: N/A;   CARDIOVERSION N/A 03/27/2017   Procedure: CARDIOVERSION;  Surgeon: Jerline Pain, MD;  Location: [place];  Service: Cardiovascular;  Laterality: N/A;   CARDIOVERSION N/A 10/29/2018   Procedure: CARDIOVERSION;  Surgeon: Sanda Klein, MD;  Location: [place];  Service: Cardiovascular;  Laterality: N/A;    CARDIOVERSION N/A 11/05/2018   Procedure: CARDIOVERSION;  Surgeon: Nahser, Wonda Cheng, MD;  Location: [place];  Service: Cardiovascular;  Laterality: N/A;   EYE SURGERY Left 1990   IR PERCUTANEOUS ART THROMBECTOMY/INFUSION INTRACRANIAL INC DIAG ANGIO  09/05/2016   IR RADIOLOGIST EVAL & MGMT  10/03/2016   RADIOLOGY WITH ANESTHESIA N/A 09/05/2016   Procedure: RADIOLOGY WITH ANESTHESIA;  Surgeon: Luanne Bras, MD;  Location: [place];  Service: Radiology;  Laterality: N/A;   RIGHT/LEFT HEART CATH AND CORONARY ANGIOGRAPHY N/A 11/03/2018   Procedure: RIGHT/LEFT HEART CATH AND CORONARY ANGIOGRAPHY;  Surgeon: Lorretta Harp, MD;  Location: [place] [facility];  Service: Cardiovascular;  Laterality: N/A;    Allergies: Benadryl [diphenhydramine]  Medications: Prior to Admission medications   Medication Sig Start Date End Date Taking? Authorizing Provider  acetaminophen (TYLENOL) 325 MG tablet Take 2 tablets (650 mg total) by mouth every 4 (four) hours as needed for mild pain (or temp > 37.5 C (99.5 F)). 12/02/18   Angiulli, Lavon Paganini, PA-C  atorvastatin (LIPITOR) 20 MG tablet Take 1 tablet (20 mg total) by mouth daily at 6 PM. 11/13/18   Donzetta Starch, NP  carvedilol (COREG) 12.5 MG tablet Take 1 tablet (12.5 mg total) by mouth 2 (two) times daily with a meal. 12/02/18   Angiulli, Lavon Paganini, PA-C  ceFEPIme 2 g in sodium chloride 0.9 % 100 mL Inject 2 g into the vein every 8 (eight) hours. 12/02/18   Angiulli, Lavon Paganini, PA-C  ciprofloxacin (CILOXAN) 0.3 % ophthalmic solution Place 1 drop into the left eye 4 (four) times daily. Administer 1 drop, every 2 hours, while awake, for 2 days. Then 1 drop, every 4 hours,  while awake, for the next 5 days. 12/02/18   Angiulli, Lavon Paganini, PA-C  divalproex (DEPAKOTE SPRINKLE) 125 MG capsule Take 1 capsule (125 mg total) by mouth every 12 (twelve) hours. 12/02/18   Angiulli, Lavon Paganini, PA-C  furosemide (LASIX) 40 MG tablet Take 1 tablet (40 mg total) by mouth  daily. 11/06/18   Mullis, Kiersten P, DO  gabapentin (NEURONTIN) 300 MG capsule TAKE 1 CAPSULE(300 MG) BY MOUTH THREE TIMES DAILY Patient taking differently: Take 300 mg by mouth 3 (three) times daily.  10/10/18   [name], MD  lidocaine (LIDODERM) 5 % Place 1 patch onto the skin daily. Remove & Discard patch within 12 hours or as directed by MD 12/03/18   Angiulli, Lavon Paganini, PA-C  lidocaine (LMX) 4 % cream Apply topically 3 (three) times daily. 12/02/18   Angiulli, Lavon Paganini, PA-C  modafinil (PROVIGIL) 100 MG tablet Take 1 tablet (100 mg total) by mouth daily at 6 (six) AM. 12/03/18   Angiulli, Lavon Paganini, PA-C  potassium chloride SA (K-DUR) 20 MEQ tablet Take 1 tablet (20 mEq total) by mouth daily. 11/06/18   Mullis, Kiersten P, DO  sacubitril-valsartan (ENTRESTO) 24-26 MG Take 1 tablet by mouth 2 (two) times daily. 11/06/18   Mullis, Kiersten P, DO  spironolactone (ALDACTONE) 25 MG tablet Take 0.5 tablets (12.5 mg total) by mouth daily. 12/03/18   Angiulli, Lavon Paganini, PA-C  topiramate (TOPAMAX) 25 MG tablet Take 1 tablet (25 mg total) by mouth at bedtime. 12/02/18   Angiulli, Lavon Paganini, PA-C  traMADol (ULTRAM) 50 MG tablet Take 1 tablet (50 mg total) by mouth every 8 (eight) hours as needed for severe pain. 12/02/18   Angiulli, Lavon Paganini, PA-C     Family History  Problem Relation Age of Onset   High blood pressure Mother    High blood pressure Father    Stroke Maternal Aunt    Heart disease Neg Hx     Social History   Socioeconomic History   Marital status: Single    Spouse name: Not on file   Number of children: Not on file   Years of education: 64 (some college)   Highest education level: Not on file  Occupational History   Occupation: disability  Social Designer, fashion/clothing strain: Not on file   Food insecurity    Worry: Not on file    Inability: Not on file   Transportation needs    Medical: Not on file    Non-medical: Not on file  Tobacco Use   Smoking  status: Current Some Day Smoker    Packs/day: 0.50    Types: Cigarettes   Smokeless tobacco: Never Used   Tobacco comment: a pack last three days  Substance and Sexual Activity   Alcohol use: Not Currently    Alcohol/week: 3.0 standard drinks    Types: 3 Cans of beer per week    Comment: pt stop drinking    Drug use: Yes    Frequency: 2.0 times per week    Types: Marijuana    Comment: stop smoking    Sexual activity: Yes    Partners: Female    Birth control/protection: Condom  Lifestyle   Physical activity    Days per week: Not on file    Minutes per session: Not on file   Stress: Not on file  Relationships   Social connections    Talks on phone: Not on file    Gets together: Not on file  Attends religious service: Not on file    Active member of club or organization: Not on file    Attends meetings of clubs or organizations: Not on file    Relationship status: Not on file  Other Topics Concern   Not on file  Social History Narrative   Not on file    Review of Systems  Unable to perform ROS: Mental status change    Vital Signs: BP 116/75    Pulse 88    Temp 100 F (37.8 C) (Axillary)    Resp (!) 23    SpO2 100%   Physical Exam Vitals signs reviewed.  Constitutional:      Comments: Sleepy, arousable, but falls right back to sleep  HENT:     Head: Normocephalic and atraumatic.  Cardiovascular:     Rate and Rhythm: Normal rate and regular rhythm.  Pulmonary:     Effort: Pulmonary effort is normal.     Breath sounds: Normal breath sounds.  Skin:    General: Skin is warm and dry.  Neurological:     Mental Status: Mental status is at baseline.     Imaging: Ct Abdomen Pelvis Wo Contrast  Result Date: 12/02/2018 CLINICAL DATA:  Abdominal pain. Fever. Right flank pain. Assess for retroperitoneal bleeding. EXAM: CT ABDOMEN AND PELVIS WITHOUT CONTRAST TECHNIQUE: Multidetector CT imaging of the abdomen and pelvis was performed following the standard  protocol without IV contrast. COMPARISON:  None. FINDINGS: Lower chest: Small right pleural effusion layering dependently. Mild atelectasis or infiltrate at the right lung base. Hepatobiliary: Liver parenchyma appears normal. There is vicarious excretion of contrast with opacification of the gallbladder. There are probably some pigment stones within the gallbladder. Pancreas: Normal Spleen: Normal Adrenals/Urinary Tract: Adrenal glands are normal. Kidneys are normal. Bladder is normal. Stomach/Bowel: No acute or significant bowel finding. Vascular/Lymphatic: Aortic atherosclerosis. No aneurysm. IVC is normal. No retroperitoneal adenopathy. No retroperitoneal hematoma. Reproductive: Normal Other: None Musculoskeletal: Chronic degenerative changes of the lumbar spine. IMPRESSION: 1. No evidence of retroperitoneal hematoma. 2. Small right pleural effusion layering dependently. Mild atelectasis or infiltrate at the right lung base. 3. Aortic atherosclerosis. 4. Gallstones in the gallbladder but no CT evidence of cholecystitis. 5. No intraperitoneal abscess.  No acute bowel pathology evident. Aortic Atherosclerosis (ICD10-I70.0). Electronically Signed   By: Nelson Chimes M.D.   On: 12/02/2018 19:52   Ct Angio Head W Or Wo Contrast  Result Date: 11/10/2018 CLINICAL DATA:  Stroke with left-sided weakness and slurred speech EXAM: CT ANGIOGRAPHY HEAD AND NECK TECHNIQUE: Multidetector CT imaging of the head and neck was performed using the standard protocol during bolus administration of intravenous contrast. Multiplanar CT image reconstructions and MIPs were obtained to evaluate the vascular anatomy. Carotid stenosis measurements (when applicable) are obtained utilizing NASCET criteria, using the distal internal carotid diameter as the denominator. CONTRAST:  37mL OMNIPAQUE IOHEXOL 350 MG/ML SOLN COMPARISON:  CT head 11/10/2018 FINDINGS: CTA NECK FINDINGS Aortic arch: Standard branching. Imaged portion shows no evidence  of aneurysm or dissection. No significant stenosis of the major arch vessel origins. Right carotid system: Calcified and noncalcified plaque right carotid bulb narrowing the lumen by approximately 35-40% diameter stenosis. Left carotid system: Mild atherosclerotic disease left carotid bifurcation without significant stenosis. Vertebral arteries: Both vertebral arteries patent to the basilar without significant stenosis. Skeleton: Cervical spondylosis without acute abnormality. Other neck: Negative Upper chest: Negative Review of the MIP images confirms the above findings CTA HEAD FINDINGS Anterior circulation: Heavily calcified right cavernous carotid  with moderate stenosis. Irregular subtotal occlusion of the right M1 segment with good distal flow. The area of stenosis has irregularity most compatible with thrombus. Left middle cerebral artery widely patent. Anterior cerebral arteries patent bilaterally without significant stenosis. Posterior circulation: Both vertebral arteries patent to the basilar. PICA patent bilaterally. Atherosclerotic irregularity and mild to moderate stenosis in the mid and distal basilar artery. Superior cerebellar and posterior cerebral arteries patent bilaterally. Mild atherosclerotic disease in the posterior cerebral arteries bilaterally. Venous sinuses: Patent Anatomic variants: None Review of the MIP images confirms the above findings IMPRESSION: 1. Subtotal occlusion right M1 segment with irregularity most consistent with thrombus. Good distal flow. Associated hemorrhagic infarct right MCA territory noted on earlier CT head. Moderate atherosclerotic stenosis right cavernous carotid. 35-40% diameter stenosis right internal carotid artery stenosis at the origin. 2. Left carotid widely patent with mild atherosclerotic disease at the bifurcation. Both vertebral arteries widely patent. 3. Mild moderate basilar stenosis. Mild atherosclerotic disease posterior cerebral artery bilaterally.  4. These results were called by telephone at the time of interpretation on 11/10/2018 at 6:45 pm to provider ERIC [facility] , who verbally acknowledged these results. 5. Electronically Signed   By: Franchot Gallo M.D.   On: 11/10/2018 18:56   Dg Chest 2 View  Result Date: 12/02/2018 CLINICAL DATA:  Shortness of breath EXAM: CHEST - 2 VIEW COMPARISON:  11/27/2018 FINDINGS: Left AICD remains in place, unchanged. Cardiomegaly. No confluent opacities, effusions or edema. No acute bony abnormality. IMPRESSION: Cardiomegaly.  No active disease. Electronically Signed   By: Rolm Baptise M.D.   On: 12/02/2018 16:31   Dg Chest 2 View  Result Date: 11/27/2018 CLINICAL DATA:  Fluid overload smoker history of CHF EXAM: CHEST - 2 VIEW COMPARISON:  11/25/2018 FINDINGS: Left-sided pacing device as before. Cardiomegaly. No significant pleural effusion. Aortic atherosclerosis. Streaky atelectasis left base. IMPRESSION: Cardiomegaly without overt failure or significant pleural effusion. Streaky atelectasis left base. Electronically Signed   By: Donavan Foil M.D.   On: 11/27/2018 21:27   Dg Chest 2 View  Result Date: 11/25/2018 CLINICAL DATA:  Intermittent fever. EXAM: CHEST - 2 VIEW COMPARISON:  11/17/2018 FINDINGS: Lordotic technique demonstrated. Left-sided pacemaker unchanged. Lungs are adequately inflated and otherwise clear. Stable cardiomegaly. Remainder of the exam is unchanged. IMPRESSION: No acute cardiopulmonary disease. Stable cardiomegaly. Electronically Signed   By: Marin Olp M.D.   On: 11/25/2018 11:17   Dg Chest 2 View  Result Date: 11/17/2018 CLINICAL DATA:  Congestion and cough. EXAM: CHEST - 2 VIEW COMPARISON:  11/16/2018 FINDINGS: Left-sided pacemaker unchanged. Lungs are adequately inflated without consolidation or effusion. Mild cardiomegaly which is stable. Remainder the exam is unchanged. IMPRESSION: No active cardiopulmonary disease. Electronically Signed   By: Marin Olp M.D.   On:  11/17/2018 12:35   Dg Chest 2 View  Result Date: 11/16/2018 CLINICAL DATA:  Stroke last week.  Cough and fever. EXAM: CHEST - 2 VIEW COMPARISON:  November 10, 2018 FINDINGS: Stable AICD device and cardiomegaly. The hila and mediastinum are unchanged. No pulmonary nodules, masses, or focal infiltrates. IMPRESSION: No cause for cough or fever identified. Electronically Signed   By: Dorise Bullion III M.D   On: 11/16/2018 16:48   Dg Abd 1 View  Result Date: 12/02/2018 CLINICAL DATA:  Right side abdominal pain and nausea for 1 week. EXAM: ABDOMEN - 1 VIEW COMPARISON:  None. FINDINGS: The bowel gas pattern is normal. No radio-opaque calculi or other significant radiographic abnormality are seen. IMPRESSION: Negative exam.  Electronically Signed   By: Inge Rise M.D.   On: 12/02/2018 13:09   Ct Head Wo Contrast  Result Date: 11/11/2018 CLINICAL DATA:  Intracranial hemorrhage, known, follow-up. Additional history provided: Patient reports frontal headache today. EXAM: CT HEAD WITHOUT CONTRAST TECHNIQUE: Contiguous axial images were obtained from the base of the skull through the vertex without intravenous contrast. COMPARISON:  CT angiogram head/neck 11/10/2018, noncontrast head CT 11/10/2018 FINDINGS: Brain: Again demonstrated is a large region of cortical/subcortical edema within the right frontal lobe with additional involvement of the right insula and extending to the right basal ganglia. Hemorrhage centered within the right caudate head and extending inferiorly into the right basal ganglia has not significantly changed in size or appearance, measuring 2.0 x 3.4 cm (TV x CC) on the current examination (previously 2 x 3.3 cm). Associated mass effect with mild right to left subfalcine herniation, partial effacement of the right lateral ventricle frontal horn and 2 mm leftward midline shift at the level of the septum pellucidum, similar to prior examination. Also similar to prior examination there is  small volume intraventricular hemorrhage within the atrium of the right lateral ventricle. Vascular: Persistent hyperdensity within the M1 right middle cerebral artery. Subtotal occlusion of the M1 right MCA was noted on CT angiogram head 11/10/2018. Atherosclerotic calcification of the carotid artery siphons. Skull: No calvarial fracture. Sinuses/Orbits: Mild scattered paranasal sinus mucosal thickening. No significant mastoid effusion Visualized orbits demonstrate no acute abnormality. IMPRESSION: 1. No significant interval change in large subacute right MCA/ACA vascular territory hemorrhagic infarction as compared to head CT 11/10/2018. An area of hemorrhage centered within the right caudate head and extending inferiorly within the right basal ganglia measures 2 x 3.4 cm (previously 2 x 3.3). Small volume hemorrhage extending within the atrium of the right lateral ventricle has not significantly changed. 2. Mass effect with partial effacement of the right lateral ventricle frontal horn, right to left subfalcine herniation and 2 mm leftward midline shift, also similar to prior exam. Electronically Signed   By: Kellie Simmering   On: 11/11/2018 09:38   Ct Angio Neck W Or Wo Contrast  Result Date: 11/10/2018 CLINICAL DATA:  Stroke with left-sided weakness and slurred speech EXAM: CT ANGIOGRAPHY HEAD AND NECK TECHNIQUE: Multidetector CT imaging of the head and neck was performed using the standard protocol during bolus administration of intravenous contrast. Multiplanar CT image reconstructions and MIPs were obtained to evaluate the vascular anatomy. Carotid stenosis measurements (when applicable) are obtained utilizing NASCET criteria, using the distal internal carotid diameter as the denominator. CONTRAST:  48mL OMNIPAQUE IOHEXOL 350 MG/ML SOLN COMPARISON:  CT head 11/10/2018 FINDINGS: CTA NECK FINDINGS Aortic arch: Standard branching. Imaged portion shows no evidence of aneurysm or dissection. No significant  stenosis of the major arch vessel origins. Right carotid system: Calcified and noncalcified plaque right carotid bulb narrowing the lumen by approximately 35-40% diameter stenosis. Left carotid system: Mild atherosclerotic disease left carotid bifurcation without significant stenosis. Vertebral arteries: Both vertebral arteries patent to the basilar without significant stenosis. Skeleton: Cervical spondylosis without acute abnormality. Other neck: Negative Upper chest: Negative Review of the MIP images confirms the above findings CTA HEAD FINDINGS Anterior circulation: Heavily calcified right cavernous carotid with moderate stenosis. Irregular subtotal occlusion of the right M1 segment with good distal flow. The area of stenosis has irregularity most compatible with thrombus. Left middle cerebral artery widely patent. Anterior cerebral arteries patent bilaterally without significant stenosis. Posterior circulation: Both vertebral arteries patent to the basilar. PICA  patent bilaterally. Atherosclerotic irregularity and mild to moderate stenosis in the mid and distal basilar artery. Superior cerebellar and posterior cerebral arteries patent bilaterally. Mild atherosclerotic disease in the posterior cerebral arteries bilaterally. Venous sinuses: Patent Anatomic variants: None Review of the MIP images confirms the above findings IMPRESSION: 1. Subtotal occlusion right M1 segment with irregularity most consistent with thrombus. Good distal flow. Associated hemorrhagic infarct right MCA territory noted on earlier CT head. Moderate atherosclerotic stenosis right cavernous carotid. 35-40% diameter stenosis right internal carotid artery stenosis at the origin. 2. Left carotid widely patent with mild atherosclerotic disease at the bifurcation. Both vertebral arteries widely patent. 3. Mild moderate basilar stenosis. Mild atherosclerotic disease posterior cerebral artery bilaterally. 4. These results were called by telephone  at the time of interpretation on 11/10/2018 at 6:45 pm to provider ERIC [facility] , who verbally acknowledged these results. 5. Electronically Signed   By: Franchot Gallo M.D.   On: 11/10/2018 18:56   [name] Contrast  Result Date: 11/12/2018 CLINICAL DATA:  Follow-up stroke.  Intracranial hemorrhage. EXAM: MRI HEAD WITHOUT CONTRAST TECHNIQUE: Multiplanar, multiecho pulse sequences of the brain and surrounding structures were obtained without intravenous contrast. COMPARISON:  Head CT 11/11/2018 and 11/10/2018 FINDINGS: Brain: Diffusion imaging redemonstrates acute infarction in the anterior portion of the right middle cerebral artery territory and possibly some of the anterior cerebral artery territory, with confluent involvement of the right anterior frontal lobe, frontal operculum region, insula, basal ganglia and frontoparietal junction region. Hemorrhage into the right basal ganglia appears similar, maximal dimension 2 cm. Layering intraventricular blood in the occipital horn of the right lateral ventricle as seen previously. No acute infarction in the left hemisphere or affecting the brainstem or cerebellum. Swelling in the region of infarction, with right-to-left shift measuring up to 8 mm. No ventricular trapping. No extra-axial collection. Vascular: Major vessels at the base of the brain show flow. Skull and upper cervical spine: Negative Sinuses/Orbits: Clear/normal Other: None IMPRESSION: No evidence of extension of the right brain infarction. Acute infarction affecting the right frontal lobe, frontal operculum, insula and frontoparietal junction region, with involvement of the right basal ganglia. 2 cm hematoma in the right basal ganglia has not enlarged. Intraventricular blood has not increased. Swelling and mass effect, right-to-left shift measuring up to 8 mm, unchanged when measured in the same location on yesterday's CT. Electronically Signed   By: Nelson Chimes M.D.   On: 11/12/2018 16:12    Dg Chest Port 1 View  Result Date: 12/02/2018 CLINICAL DATA:  Respiratory failure EXAM: PORTABLE CHEST 1 VIEW COMPARISON:  12/02/2018, 11/27/2018 FINDINGS: Left-sided pacing device as before. Cardiomegaly with central vascular congestion and aortic atherosclerosis. No consolidation, pleural effusion or pneumothorax. IMPRESSION: Cardiomegaly with central vascular congestion Electronically Signed   By: Donavan Foil M.D.   On: 12/02/2018 22:25   Dg Chest Portable 1 View  Result Date: 11/10/2018 CLINICAL DATA:  Stroke EXAM: PORTABLE CHEST 1 VIEW COMPARISON:  10/29/2018 FINDINGS: Left AICD remains in place, unchanged. Cardiomegaly. No confluent opacities, effusions or edema. No acute bony abnormality. IMPRESSION: Cardiomegaly.  No active disease. Electronically Signed   By: Rolm Baptise M.D.   On: 11/10/2018 19:58   Dg Swallowing Func-speech Pathology  Result Date: 11/12/2018 Objective Swallowing Evaluation: Type of Study: Bedside Swallow Evaluation  Patient Details Name: Glenn [name] MRN: [id number] Date of Birth: [birth year] Today's Date: 11/12/2018 Time: SLP Start Time (ACUTE ONLY): 0900 -SLP Stop Time (ACUTE ONLY): [id number] SLP Time Calculation (min) (ACUTE ONLY):  24 min Past Medical History: Past Medical History: Diagnosis Date  Atrial fibrillation ([place])   CHF (congestive heart failure) ([place])   Hepatitis C   Hypertension   Stroke [facility])   Visit for monitoring Tikosyn therapy 03/26/2017 Past Surgical History: Past Surgical History: Procedure Laterality Date  CARDIAC DEFIBRILLATOR PLACEMENT  2015  CARDIOVERSION N/A 10/10/2016  Procedure: CARDIOVERSION;  Surgeon: Dorothy Spark, MD;  Location: [place];  Service: Cardiovascular;  Laterality: N/A;  CARDIOVERSION N/A 03/27/2017  Procedure: CARDIOVERSION;  Surgeon: Jerline Pain, MD;  Location: [place];  Service: Cardiovascular;  Laterality: N/A;  CARDIOVERSION N/A 10/29/2018  Procedure: CARDIOVERSION;  Surgeon: Sanda Klein, MD;   Location: [place];  Service: Cardiovascular;  Laterality: N/A;  CARDIOVERSION N/A 11/05/2018  Procedure: CARDIOVERSION;  Surgeon: Nahser, Wonda Cheng, MD;  Location: [place];  Service: Cardiovascular;  Laterality: N/A;  EYE SURGERY Left 1990  IR PERCUTANEOUS ART THROMBECTOMY/INFUSION INTRACRANIAL INC DIAG ANGIO  09/05/2016  IR RADIOLOGIST EVAL & MGMT  10/03/2016  RADIOLOGY WITH ANESTHESIA N/A 09/05/2016  Procedure: RADIOLOGY WITH ANESTHESIA;  Surgeon: Luanne Bras, MD;  Location: [place];  Service: Radiology;  Laterality: N/A;  RIGHT/LEFT HEART CATH AND CORONARY ANGIOGRAPHY N/A 11/03/2018  Procedure: RIGHT/LEFT HEART CATH AND CORONARY ANGIOGRAPHY;  Surgeon: Lorretta Harp, MD;  Location: [facility];  Service: Cardiovascular;  Laterality: N/A; HPI: Glenn [name] is an 68 y.o. male with atrial fibrillation on Eliquis, CHF, hepatitis C, HTN, recent cardiac defibrillator placement and prior stroke admitted with L side weakness, slurred speech, and altered mental status. CT showed large subacute hermorrhagic infarct R MCA. CXR was unremarkable. BSE 09/07/16 revealed functional swallow and regular/thin liquids rec'd. Pt was given the [place] and RN reported coughing and choking, currently NPO.  Subjective: "want some water" Assessment / Plan / Recommendation CHL IP CLINICAL IMPRESSIONS 11/12/2018 Clinical Impression MBS revealed mild-moderate oropharyngeal dysphagia characterized by anterior spillage 2/2 L weakness and reduced epiglottic inversion that resulted in flash penetration with no instances of aspiration. Although epiglottis inversion is absent or reduced, there was enough constriction via pharyngeal peristalsis to prevent aspiration across all consistencies. Thin liquids were swallowed quickly with appropriate timing compared to nectar thick which resulted in a delayed, uncoordinated swallow and greater pharyngeal residue. Mild-mod prolonged mastication and transit with regular solids and  significant barium in left lateral sulci. He was receptive to chewing on his stronger R side and scanning for pocketing on his weak side. Pt was unable to swallow pill given thins or puree and expectorated. Recommend Dysphagia 2 diet with thin liquids, straws allowed, meds crushed in puree and cueing for compensatory strategies of R side chewing and lingual sweeping for L pocketing.    SLP Visit Diagnosis Dysphagia, oropharyngeal phase (R13.12) Attention and concentration deficit following -- Frontal lobe and executive function deficit following -- Impact on safety and function Mild aspiration risk   CHL IP TREATMENT RECOMMENDATION 11/12/2018 Treatment Recommendations Therapy as outlined in treatment plan below   Prognosis 11/12/2018 Prognosis for Safe Diet Advancement Good Barriers to Reach Goals -- Barriers/Prognosis Comment -- CHL IP DIET RECOMMENDATION 11/12/2018 SLP Diet Recommendations Dysphagia 2 (Fine chop) solids;Thin liquid Liquid Administration via Cup;Straw Medication Administration Crushed with puree Compensations Monitor for anterior loss;Lingual sweep for clearance of pocketing Postural Changes Seated upright at 90 degrees   CHL IP OTHER RECOMMENDATIONS 11/12/2018 Recommended Consults -- Oral Care Recommendations Oral care BID Other Recommendations --   CHL IP FOLLOW UP RECOMMENDATIONS 11/12/2018 Follow up Recommendations Inpatient Rehab  CHL IP FREQUENCY AND DURATION 11/12/2018 Speech Therapy Frequency (ACUTE ONLY) min 2x/week Treatment Duration 2 weeks      CHL IP ORAL PHASE 11/12/2018 Oral Phase Impaired Oral - Pudding Teaspoon -- Oral - Pudding Cup -- Oral - Honey Teaspoon -- Oral - Honey Cup -- Oral - Nectar Teaspoon -- Oral - Nectar Cup Left anterior bolus loss Oral - Nectar Straw -- Oral - Thin Teaspoon -- Oral - Thin Cup Left anterior bolus loss Oral - Thin Straw Left anterior bolus loss Oral - Puree -- Oral - Mech Soft -- Oral - Regular Left anterior bolus loss;Delayed oral transit;Weak lingual  manipulation Oral - Multi-Consistency -- Oral - Pill Weak lingual manipulation;Holding of bolus Oral Phase - Comment --  CHL IP PHARYNGEAL PHASE 11/12/2018 Pharyngeal Phase Impaired Pharyngeal- Pudding Teaspoon -- Pharyngeal -- Pharyngeal- Pudding Cup -- Pharyngeal -- Pharyngeal- Honey Teaspoon -- Pharyngeal -- Pharyngeal- Honey Cup -- Pharyngeal -- Pharyngeal- Nectar Teaspoon -- Pharyngeal -- Pharyngeal- Nectar Cup Pharyngeal residue - valleculae;Pharyngeal residue - pyriform;Reduced epiglottic inversion Pharyngeal -- Pharyngeal- Nectar Straw -- Pharyngeal -- Pharyngeal- Thin Teaspoon -- Pharyngeal -- Pharyngeal- Thin Cup Reduced epiglottic inversion;Penetration/Aspiration during swallow Pharyngeal Material enters airway, remains ABOVE vocal cords then ejected out Pharyngeal- Thin Straw Reduced epiglottic inversion Pharyngeal -- Pharyngeal- Puree -- Pharyngeal -- Pharyngeal- Mechanical Soft -- Pharyngeal -- Pharyngeal- Regular Reduced epiglottic inversion;Pharyngeal residue - pyriform;Pharyngeal residue - valleculae Pharyngeal -- Pharyngeal- Multi-consistency -- Pharyngeal -- Pharyngeal- Pill NT Pharyngeal -- Pharyngeal Comment --  CHL IP CERVICAL ESOPHAGEAL PHASE 11/12/2018 Cervical Esophageal Phase WFL Pudding Teaspoon -- Pudding Cup -- Honey Teaspoon -- Honey Cup -- Nectar Teaspoon -- Nectar Cup -- Nectar Straw -- Thin Teaspoon -- Thin Cup -- Thin Straw -- Puree -- Mechanical Soft -- Regular -- Multi-consistency -- Pill -- Cervical Esophageal Comment -- [place] Siren 11/12/2018, 4:15 PM [name] Litaker M.Ed Actor Pager [phone] Office [phone]              Dg Hip Unilat With Pelvis 2-3 Views Right  Result Date: 11/13/2018 CLINICAL DATA:  Right hip pain. EXAM: DG HIP (WITH OR WITHOUT PELVIS) 2-3V RIGHT COMPARISON:  None. FINDINGS: There is no evidence of hip fracture or dislocation. There is no evidence of arthropathy or other focal bone abnormality. IMPRESSION: Negative.  Electronically Signed   By: Marijo Conception M.D.   On: 11/13/2018 15:05   Ct Head Code Stroke Wo Contrast  Result Date: 11/10/2018 CLINICAL DATA:  Code stroke.  Left-sided weakness slurred speech EXAM: CT HEAD WITHOUT CONTRAST TECHNIQUE: Contiguous axial images were obtained from the base of the skull through the vertex without intravenous contrast. COMPARISON:  CT head 09/07/2016 FINDINGS: Brain: Large area of cortical edema in the right frontal lobe. There is also involvement of the insula and basal ganglia which show abnormal hypodensity. Hyperdensity in the right head of caudate and anterior basal ganglia compatible with recent hemorrhage. Mild subfalcine herniation of right frontal lobe due to edema. Ventricle size normal.  No other acute infarct or mass Vascular: Negative for hyperdense vessel Skull: Negative Sinuses/Orbits: Mild mucosal edema paranasal sinuses. Negative orbit. Other: None ASPECTS ([place] Stroke Program Early CT Score) - Ganglionic level infarction (caudate, lentiform nuclei, internal capsule, insula, M1-M3 cortex): 1 - Supraganglionic infarction (M4-M6 cortex): 2 Total score (0-10 with 10 being normal): 3 IMPRESSION: 1. Large territory subacute hemorrhagic infarct right MCA territory. Area of hemorrhage measures approximately 2 x 3 cm centered in the head of the caudate and  putamen on the right. The blood likely is subacute. Local mass-effect and mild subfalcine herniation due to edema. 2. ASPECTS is 3 3. Emergent results communicated with Dr. Cheral Marker via text page. Electronically Signed   By: Franchot Gallo M.D.   On: 11/10/2018 18:13   Vas [place] Lower Extremity Venous (dvt)  Result Date: 12/03/2018  Lower Venous Study Indications: Swelling, and respiratory failure.  Comparison Study: No prior study Performing Technologist: Maudry Mayhew MHA, RDMS, RVT, RDCS  Examination Guidelines: A complete evaluation includes B-mode imaging, spectral Doppler, color Doppler, and power Doppler  as needed of all accessible portions of each vessel. Bilateral testing is considered an integral part of a complete examination. Limited examinations for reoccurring indications may be performed as noted.  +---------+---------------+---------+-----------+----------+--------------+  RIGHT     Compressibility Phasicity Spontaneity Properties Thrombus Aging  +---------+---------------+---------+-----------+----------+--------------+  CFV       Full            Yes       Yes                                    +---------+---------------+---------+-----------+----------+--------------+  SFJ       Full                                                             +---------+---------------+---------+-----------+----------+--------------+  FV Prox   Full                                                             +---------+---------------+---------+-----------+----------+--------------+  FV Mid    Full                                                             +---------+---------------+---------+-----------+----------+--------------+  FV Distal Full                                                             +---------+---------------+---------+-----------+----------+--------------+  PFV       Full                                                             +---------+---------------+---------+-----------+----------+--------------+  POP       Full            Yes       Yes                                    +---------+---------------+---------+-----------+----------+--------------+  PTV       Full                                                             +---------+---------------+---------+-----------+----------+--------------+  PERO      Full                                                             +---------+---------------+---------+-----------+----------+--------------+  +---------+---------------+---------+-----------+----------+--------------+  LEFT      Compressibility Phasicity Spontaneity Properties Thrombus  Aging  +---------+---------------+---------+-----------+----------+--------------+  CFV       Partial                   No                     Acute           +---------+---------------+---------+-----------+----------+--------------+  SFJ       Partial                                          Acute           +---------+---------------+---------+-----------+----------+--------------+  FV Prox   None                                             Acute           +---------+---------------+---------+-----------+----------+--------------+  FV Mid    None                                             Acute           +---------+---------------+---------+-----------+----------+--------------+  FV Distal None                                             Acute           +---------+---------------+---------+-----------+----------+--------------+  PFV       None                                             Acute           +---------+---------------+---------+-----------+----------+--------------+  POP       None                      No                     Acute           +---------+---------------+---------+-----------+----------+--------------+  PTV       None  No                     Acute           +---------+---------------+---------+-----------+----------+--------------+  PERO      None                      No                     Acute           +---------+---------------+---------+-----------+----------+--------------+  Gastroc   None                      No                     Acute           +---------+---------------+---------+-----------+----------+--------------+  EIV                                 Yes                                    +---------+---------------+---------+-----------+----------+--------------+  Unable to evaluate left common iliac vein and IVC due to patient position.  Summary: Right: There is no evidence of deep vein thrombosis in the lower extremity. No cystic structure found in the  popliteal fossa. Left: Findings consistent with acute deep vein thrombosis involving the left common femoral vein, left femoral vein, left popliteal vein, left proximal profunda vein, left posterior tibial veins, left peroneal veins, and left gastrocnemius veins. No cystic structure found in the popliteal fossa.  *See table(s) above for measurements and observations. Electronically signed by Curt Jews MD on 12/03/2018 at 3:04:32 PM.    Final     Labs:  CBC: Recent Labs    12/01/18 0726 12/02/18 1528 12/02/18 2018 12/02/18 2202 12/04/18 0333  WBC 4.7 11.3* 15.6*  --  17.3*  HGB 11.0* 12.6* 12.3* 10.9* 11.2*  HCT 33.1* 38.0* 36.1* 32.0* 33.5*  PLT 220 276 278  --  214    COAGS: Recent Labs    11/01/18 1814 11/03/18 0604 11/10/18 1753  11/11/18 0329 11/11/18 0953 12/02/18 2018 12/04/18 0333  INR  --   --  1.2   < > 1.2 1.3* 1.4* 1.4*  APTT 35 51* 31  --   --   --  31  --    < > = values in this interval not displayed.    BMP: Recent Labs    12/02/18 1528 12/02/18 2202 12/02/18 2209 12/03/18 0225 12/03/18 0856 12/04/18 0333  NA 137 137 136  --  138 138  K 4.9 5.0 6.1*  --  5.2* 4.5  CL 102  --  103  --  105 103  CO2 23  --  22  --  24 23  GLUCOSE 149*  --  136*  --  139* 138*  BUN 15  --  17  --  17 16  CALCIUM 9.2  --  8.5*  --  8.6* 8.8*  CREATININE 1.39*  --  1.66* 1.55* 1.59* 1.49*  GFRNONAA 52*  --  42* 46* 45* 48*  GFRAA >60  --  49* 53* 52* 56*    LIVER FUNCTION TESTS: Recent Labs    11/28/18 0551 12/01/18 0726 12/02/18  1528 12/02/18 2209  BILITOT 0.8 0.5 0.8 0.8  AST 27 24 25 26   ALT 18 14 16 16   ALKPHOS 53 55 68 61  PROT 5.8* 6.2* 7.4 6.5  ALBUMIN 2.5* 2.6* 3.1* 2.7*    TUMOR MARKERS: No results for input(s): AFPTM, CEA, CA199, CHROMGRNA in the last 8760 hours.  Assessment and Plan:  Acute left lower extremity DVT.  Unable to be anticoagulated due to recent intracranial hemorrhage.  Will place IVC filter today by Dr.  Laurence Ferrari.  Risks and benefits discussed with the patient and his son including, but not limited to bleeding, infection, contrast induced renal failure, filter fracture or migration which can lead to emergency surgery or even death, strut penetration with damage or irritation to adjacent structures and caval thrombosis.  All of the patient's questions were answered, patient is agreeable to proceed. Consent signed and in chart.  Thank you for this interesting consult.  I greatly enjoyed meeting Bhavesh [place] and look forward to participating in their care.  A copy of this report was sent to the requesting provider on this date.  Electronically Signed: Murrell Redden, PA-C   12/04/2018, 9:00 AM      I spent a total of 20 Minutes in face to face in clinical consultation, greater than 50% of which was counseling/coordinating care for IVC filter.

## 2018-12-04 NOTE — Progress Notes (Signed)
NAME:  Glenn [name], MRN:  [id number], DOB:  May 22, 1950, LOS: 2 ADMISSION DATE:  12/02/2018, CONSULTATION DATE: 12/02/2018 REFERRING MD: Dr. Blaine Hamper, CHIEF COMPLAINT: Respiratory distress  Brief History   68 year old gentleman transferred from [facility] with fever, A. fib with RVR, abdominal pain, respiratory distress Consult for medical management Recent CVA  Extensive DVT  History of present illness   68 year old male with past medical history significant for hypertension, hyperlipidemia, A. Fib previously on eliquis, HFrEF with EF 15-20% s/p ICD and cardioversion 10/29/2018, Hep C, CKD stage III, who most recently being admitted on 9/21 with left hemiparessis found to have large right MCA subacute hemorrhage. Additionally found to have acute nonischemic cardiomyopathy with EF 15-20% s/p ICD placement and cardioversion 9/9 for Afib.  Discharge 10/13  Did have a fever of 102, respiratory discomfort post enema on 1013 CT scan of the abdomen was unremarkable  On empiric antibiotics-Zosyn   On nasal cannula at present  Past Medical History   Past Medical History:  Diagnosis Date  . Atrial fibrillation ([place])   . CHF (congestive heart failure) ([place])   . Hepatitis C   . Hypertension   . Stroke ([place])   . Visit for monitoring Tikosyn therapy 03/26/2017   Significant Hospital Events   Acute respiratory failure 12/02/2018-initiated rapid response and transferred to the ICU  Consults:  PCCM on 12/02/2018  Procedures:    Significant Diagnostic Tests:  12/02/2018 CTH a/p >> 1. No evidence of retroperitoneal hematoma. 2. Small right pleural effusion layering dependently. Mild atelectasis or infiltrate at the right lung base. 3. Aortic atherosclerosis. 4. Gallstones in the gallbladder but no CT evidence of cholecystitis. 5. No intraperitoneal abscess. No acute bowel pathology evident. Aortic Atherosclerosis   Micro Data:  10/13 blood culture x2>> 10/13 urine culture>>   Antimicrobials:  10/8 ceftriaxone completed  10/13 cefepime 10/13 zosyn >> 10/13 vanc  Interim history/subjective:  On nasal cannula Overall mental status appears to be better Continues to complain of some abdominal discomfort Left-sided lower chest and upper abdominal pain T-max 101.6  Objective   Blood pressure 116/75, pulse 88, temperature 100 F (37.8 C), temperature source Axillary, resp. rate (!) 23, SpO2 100 %.    FiO2 (%):  [30 %-40 %] 30 %   Intake/Output Summary (Last 24 hours) at 12/04/2018 0931 Last data filed at 12/04/2018 0800 Gross per 24 hour  Intake 525.24 ml  Output 660 ml  Net -134.76 ml   Examination: General: Acutely ill-appearing, comfortable . HENT: Moist oral mucosa, pupils reactive. Lungs: Decreased air movement bilaterally. Cardiac: S1-S2 appreciated Abdomen: Soft, nontender, bowel sounds appreciated  extremities: Edema Neuro: Alert and oriented, decreased strength on the left side GU: [facility] Problem list     Assessment & Plan:  Marland Kitchen  Acute respiratory failure -Concern for aspiration-we will continue antibiotics -Continue nasal cannula -Hypoventilation -Continue bronchodilator treatments  .  Extensive DVT .  High probability pulmonary embolism -Borderline creatinine -Interventional radiology consulted for IVC filter placement as patient cannot be safely anticoagulated secondary to recent hemorrhagic CVA  SIRS -CT abdomen did not reveal acute injury -Leukocytosis  Atrial fibrillation with rapid ventricular response -Not on full anticoagulation secondary to recent hemorrhagic stroke -Diltiazem for rate control -Continue to monitor electrolytes -Correct electrolytes as needed  .  Nonischemic cardiomyopathy with ejection fraction of 15 to 25% -ICD in place -No acute EKG changes  .  Acute kidney injury on chronic kidney disease stage III -We will discontinue Foley today -  Trend electrolytes -Avoid  nephrotoxic's -Ensure adequate renal perfusion by maintaining MAP greater than 65   .  History of MCA hemorrhagic stroke with residual facial droop, left-sided hemiparesis and dysphagia -Speech evaluation and management -Neuro exams  History of hepatitis C -Outpatient follow-up   Labs reviewed Order chest x-ray and abdominal x-ray  Best practice:  Diet: Ice chips as tolerated, speech evaluation Pain/Anxiety/Delirium protocol (if indicated): Morphine as needed VAP protocol (if indicated): Not applicable DVT prophylaxis: Lovenox GI prophylaxis: Not applicable Glucose control: Monitor glucose Mobility: Bedrest Code Status: Full code Family Communication: Will update Disposition: ICU  Labs   CBC: Recent Labs  Lab 11/28/18 0551 12/01/18 0726 12/02/18 1528 12/02/18 2018 12/02/18 2202 12/04/18 0333  WBC 4.7 4.7 11.3* 15.6*  --  17.3*  NEUTROABS 2.7 2.4  --  12.3*  --   --   HGB 11.3* 11.0* 12.6* 12.3* 10.9* 11.2*  HCT 33.6* 33.1* 38.0* 36.1* 32.0* 33.5*  MCV 94.6 95.7 95.7 94.5  --  96.0  PLT 188 220 276 278  --  [id number]    Basic Metabolic Panel: Recent Labs  Lab 12/01/18 0726 12/02/18 1528 12/02/18 2202 12/02/18 2209 12/03/18 0225 12/03/18 0856 12/04/18 0333  NA 139 137 137 136  --  138 138  K 4.3 4.9 5.0 6.1*  --  5.2* 4.5  CL 104 102  --  103  --  105 103  CO2 25 23  --  22  --  24 23  GLUCOSE 115* 149*  --  136*  --  139* 138*  BUN 11 15  --  17  --  17 16  CREATININE 1.07 1.39*  --  1.66* 1.55* 1.59* 1.49*  CALCIUM 9.0 9.2  --  8.5*  --  8.6* 8.8*  MG  --   --   --   --  1.8  --   --    GFR: Estimated Creatinine Clearance: 51.2 mL/min (A) (by C-G formula based on SCr of 1.49 mg/dL (H)). Recent Labs  Lab 12/01/18 0726 12/02/18 1528 12/02/18 2008 12/02/18 2018 12/02/18 2209 12/02/18 2220 12/04/18 0333  PROCALCITON  --   --   --   --  0.17  --   --   WBC 4.7 11.3*  --  15.6*  --   --  17.3*  LATICACIDVEN  --   --  1.2  --   --  1.5  --     ABG     Component Value Date/Time   PHART 7.402 12/02/2018 2202   PCO2ART 38.3 12/02/2018 2202   PO2ART 447.0 (H) 12/02/2018 2202   HCO3 23.8 12/02/2018 2202   TCO2 25 12/02/2018 2202   ACIDBASEDEF 1.0 12/02/2018 2202   O2SAT 100.0 12/02/2018 2202     Past Medical History  He,  has a past medical history of Atrial fibrillation ([place]), CHF (congestive heart failure) ([place]), Hepatitis C, Hypertension, Stroke ([place]), and Visit for monitoring Tikosyn therapy (03/26/2017).   Surgical History    Past Surgical History:  Procedure Laterality Date  . CARDIAC DEFIBRILLATOR PLACEMENT  2015  . CARDIOVERSION N/A 10/10/2016   Procedure: CARDIOVERSION;  Surgeon: Dorothy Spark, MD;  Location: [facility] ENDOSCOPY;  Service: Cardiovascular;  Laterality: N/A;  . CARDIOVERSION N/A 03/27/2017   Procedure: CARDIOVERSION;  Surgeon: Jerline Pain, MD;  Location: [facility] ENDOSCOPY;  Service: Cardiovascular;  Laterality: N/A;  . CARDIOVERSION N/A 10/29/2018   Procedure: CARDIOVERSION;  Surgeon: Sanda Klein, MD;  Location: [place];  Service: Cardiovascular;  Laterality: N/A;  . CARDIOVERSION N/A 11/05/2018   Procedure: CARDIOVERSION;  Surgeon: Acie Fredrickson Wonda Cheng, MD;  Location: [place];  Service: Cardiovascular;  Laterality: N/A;  . EYE SURGERY Left 1990  . IR PERCUTANEOUS ART THROMBECTOMY/INFUSION INTRACRANIAL INC DIAG ANGIO  09/05/2016  . IR RADIOLOGIST EVAL & MGMT  10/03/2016  . RADIOLOGY WITH ANESTHESIA N/A 09/05/2016   Procedure: RADIOLOGY WITH ANESTHESIA;  Surgeon: Luanne Bras, MD;  Location: [place];  Service: Radiology;  Laterality: N/A;  . RIGHT/LEFT HEART CATH AND CORONARY ANGIOGRAPHY N/A 11/03/2018   Procedure: RIGHT/LEFT HEART CATH AND CORONARY ANGIOGRAPHY;  Surgeon: Lorretta Harp, MD;  Location: [facility];  Service: Cardiovascular;  Laterality: N/A;     Social History   reports that he has been smoking cigarettes. He has been smoking about 0.50 packs per day. He has never used smokeless  tobacco. He reports previous alcohol use of about 3.0 standard drinks of alcohol per week. He reports current drug use. Frequency: 2.00 times per week. Drug: Marijuana. He was never diagnosed with COPD  Family History   His family history includes High blood pressure in his father and mother; Stroke in his maternal aunt. There is no history of Heart disease.   The patient is critically ill with multiple organ systems failure and requires high complexity decision making for assessment and support, frequent evaluation and titration of therapies, application of advanced monitoring technologies and extensive interpretation of multiple databases. Critical Care Time devoted to patient care services described in this note independent of APP/resident time (if applicable)  is 35 minutes.   Sherrilyn Rist MD [facility] Personal pager: [phone] If unanswered, please page CCM On-call: [phone]

## 2018-12-04 NOTE — Evaluation (Signed)
Clinical/Bedside Swallow Evaluation Patient Details  Name: Glenn [name] MRN: [id number] Date of Birth: May 16, 1950  Today's Date: 12/04/2018 Time: SLP Start Time (ACUTE ONLY): 1137 SLP Stop Time (ACUTE ONLY): 1155 SLP Time Calculation (min) (ACUTE ONLY): 18 min  Past Medical History:  Past Medical History:  Diagnosis Date  . Atrial fibrillation ([place])   . CHF (congestive heart failure) ([place])   . Hepatitis C   . Hypertension   . Stroke ([place])   . Visit for monitoring Tikosyn therapy 03/26/2017   Past Surgical History:  Past Surgical History:  Procedure Laterality Date  . CARDIAC DEFIBRILLATOR PLACEMENT  2015  . CARDIOVERSION N/A 10/10/2016   Procedure: CARDIOVERSION;  Surgeon: Dorothy Spark, MD;  Location: [facility];  Service: Cardiovascular;  Laterality: N/A;  . CARDIOVERSION N/A 03/27/2017   Procedure: CARDIOVERSION;  Surgeon: Jerline Pain, MD;  Location: [facility] ENDOSCOPY;  Service: Cardiovascular;  Laterality: N/A;  . CARDIOVERSION N/A 10/29/2018   Procedure: CARDIOVERSION;  Surgeon: Sanda Klein, MD;  Location: [facility] ENDOSCOPY;  Service: Cardiovascular;  Laterality: N/A;  . CARDIOVERSION N/A 11/05/2018   Procedure: CARDIOVERSION;  Surgeon: Acie Fredrickson Wonda Cheng, MD;  Location: [facility];  Service: Cardiovascular;  Laterality: N/A;  . EYE SURGERY Left 1990  . IR PERCUTANEOUS ART THROMBECTOMY/INFUSION INTRACRANIAL INC DIAG ANGIO  09/05/2016  . IR RADIOLOGIST EVAL & MGMT  10/03/2016  . RADIOLOGY WITH ANESTHESIA N/A 09/05/2016   Procedure: RADIOLOGY WITH ANESTHESIA;  Surgeon: Luanne Bras, MD;  Location: [facility];  Service: Radiology;  Laterality: N/A;  . RIGHT/LEFT HEART CATH AND CORONARY ANGIOGRAPHY N/A 11/03/2018   Procedure: RIGHT/LEFT HEART CATH AND CORONARY ANGIOGRAPHY;  Surgeon: Lorretta Harp, MD;  Location: [facility];  Service: Cardiovascular;  Laterality: N/A;   HPI:  68 year old male with past medical history significant for hypertension, hyperlipidemia,  A. Fib previously on eliquis, HFrEF with EF 15-20% s/p ICD and cardioversion 10/29/2018, Hep C, CKD stage III, who admitted on 9/21 with left hemiparessis found to have large right MCA subacute hemorrhage. Additionally found to have acute nonischemic cardiomyopathy with EF 15-20% s/p ICD placement and cardioversion 9/9 for Afib. Pt with CIR admission from 9/25 to 10/13. Discharge back to acute with fever, A. fib with RVR, abdominal pain, respiratory distress with diagnosis of DVT. Pt awaiting IVC. Chest x-ray (12/04/18) concerning for increased opacity at the RIGHT lung base consistent with pleural effusion and atelectasis or infiltrate. Pt currently NPO d/t concerns of aspiration in setting of continued cognitive deficits and respiratory distress.    Assessment / Plan / Recommendation Clinical Impression  Pt appears at very high risk of aspiration d/t compromised respiratory function and deficits in mentation. Pt with significantly decreased arousal and despite Max A multimodal cues/stimulation by SLP, pt only able to achieve brief periods of sustained attention (<1 minute). As a result, pt demonstrated oral holding and decreased manipulation of trials of ice chips. Pt's RR also increased to 35 with HR increased to 130s. While increased RR and HR are reported as functional for pt at this time, pt's oral phase deficits and deficits in arousal place him at very high risk of aspiration. At this time, recommend pt remain NPO with ST to follow for PO readiness. Results of BSE shared with nursing as well as current recommendation. ST to follow.  SLP Visit Diagnosis: Dysphagia, oropharyngeal phase (R13.12)    Aspiration Risk  Severe aspiration risk;Risk for inadequate nutrition/hydration    Diet Recommendation NPO   Medication Administration: Via alternative means  Other  Recommendations Oral Care Recommendations: Oral care QID   Follow up Recommendations (TBD)      Frequency and Duration min 2x/week   2 weeks       Prognosis Prognosis for Safe Diet Advancement: Fair Barriers to Reach Goals: Cognitive deficits;Severity of deficits      Swallow Study   General Date of Onset: 12/02/18 HPI: 68 year old male with past medical history significant for hypertension, hyperlipidemia, A. Fib previously on eliquis, HFrEF with EF 15-20% s/p ICD and cardioversion 10/29/2018, Hep C, CKD stage III, who admitted on 9/21 with left hemiparessis found to have large right MCA subacute hemorrhage. Additionally found to have acute nonischemic cardiomyopathy with EF 15-20% s/p ICD placement and cardioversion 9/9 for Afib. Pt with CIR admission from 9/25 to 10/13. Discharge back to acute with fever, A. fib with RVR, abdominal pain, respiratory distress with diagnosis of DVT. Pt awaiting IVC. Chest x-ray (12/04/18) concerning for increased opacity at the RIGHT lung base consistent with pleural effusion and atelectasis or infiltrate. Pt currently NPO d/t concerns of aspiration in setting of continued cognitive deficits and respiratory distress.  Type of Study: Bedside Swallow Evaluation Previous Swallow Assessment: MBS 9/23 Diet Prior to this Study: NPO Temperature Spikes Noted: Yes Respiratory Status: Nasal cannula History of Recent Intubation: No Behavior/Cognition: Confused;Lethargic/Drowsy;Distractible;Requires cueing;Doesn't follow directions Oral Cavity Assessment: Within Functional Limits Oral Care Completed by SLP: Yes Oral Cavity - Dentition: Adequate natural dentition Self-Feeding Abilities: Total assist Patient Positioning: Upright in bed Baseline Vocal Quality: Normal Volitional Cough: Weak Volitional Swallow: Unable to elicit    Oral/Motor/Sensory Function Overall Oral Motor/Sensory Function: Moderate impairment Facial ROM: Reduced left;Suspected CN VII (facial) dysfunction Facial Symmetry: Suspected CN VII (facial) dysfunction;Abnormal symmetry left Facial Strength: Reduced left;Suspected CN VII  (facial) dysfunction   Ice Chips Ice chips: Impaired Presentation: Spoon Oral Phase Impairments: Reduced lingual movement/coordination;Poor awareness of bolus;Impaired mastication Oral Phase Functional Implications: Prolonged oral transit;Oral holding   Thin Liquid Thin Liquid: Not tested    Nectar Thick Nectar Thick Liquid: Not tested   Honey Thick Honey Thick Liquid: Not tested   Puree Puree: Not tested   Solid     Solid: Not tested      Savita Runner 12/04/2018,1:28 PM

## 2018-12-04 NOTE — Progress Notes (Signed)
Pt w/ increasing tachypnea and wob, RR consistently in 40s, HR in 120s/130s (afib).  Morphine given w/ no relief.  Pt placed back on Bipap 16/8, 30%.  Relayed to MD and RT.

## 2018-12-04 NOTE — Progress Notes (Signed)
Initial Nutrition Assessment  DOCUMENTATION CODES:  Not applicable  INTERVENTION:  Once Cortrak placed on 10/16, initiate tube feeds: - Osmolite 1.5 @ 55 ml/hr (1320 ml/day) - Pro-stat 30 ml daily - Free water per MD  Tube feeding regimen provides 2080 kcal, 98 grams of protein, and 1006 ml of H2O.   NUTRITION DIAGNOSIS:  Inadequate oral intake related to inability to eat as evidenced by NPO status.  GOAL:  Patient will meet greater than or equal to 90% of their needs  MONITOR:  Diet advancement, Labs, Weight trends, TF tolerance, Skin, I & O's  REASON FOR ASSESSMENT:   Consult Enteral/tube feeding initiation and management  ASSESSMENT:   68 year old male who transferred from CIR on 10/13 with worsening respiratory distress. PMH significant for HTN, HLD, atrial fibrillation, CHF, s/p ICD and cardioversion 10/29/18, hepatitis C, CKD stage III. Pt was recently admitted on 9/21 with left hemiparesis and found to have large MCA subacute hemorrhage. After transfer to acute care, CT showing small right pleural effusion, gallstones with no evidence of cholecystitis. Pt transferred to the ICU.  Noted plan for IVC filter placement today by IR. Pt did not pass swallow evaluation. Plan is for Cortrak placement tomorrow. RD consulted for TF initiation and management.  Discussed pt with RN.  Weight has fluctuated between 85-97 kg over the last year, trending down since 10/29/18. Pt with a documented 10.7 kg weight loss since 10/29/18. This is an 11.1% weight loss which is significant for timeframe. Pt is at risk for malnutrition. RD unable to diagnose malnutrition at this time without completion of NFPE and diet and weight history.  Unable to obtain diet or weight history at this time. Pt in IR undergoing IVC filter placement at time of RD visit.  Per RN edema assessment, pt with non-pitting edema to BUE and mild to moderate pitting edema to BLE.  Medications reviewed and include:  Cardizem, IV abx  Labs reviewed.  UOP: 660 ml x 24 hours I/O's: +1.0 L since admit  NUTRITION - FOCUSED PHYSICAL EXAM:  Unable to complete at this time. Pt in IR undergoing IVC filter placement at time of RD visit.  Diet Order:   Diet Order            Diet NPO time specified  Diet effective midnight              EDUCATION NEEDS:   Not appropriate for education at this time  Skin:  Skin Assessment: Reviewed RN Assessment (MASD bilateral groin)  Last BM:  12/02/18  Height:   Ht Readings from Last 1 Encounters:  11/22/18 5\' 7"  (1.702 m)    Weight:   Wt Readings from Last 1 Encounters:  12/02/18 86.4 kg    Ideal Body Weight:  67.3 kg  BMI:  29.82 kg/m^2  Estimated Nutritional Needs:   Kcal:  2000-2200  Protein:  90-105 grams  Fluid:  >/= 2.0 L    Gaynell Face, MS, RD, LDN Inpatient Clinical Dietitian Pager: [phone] Weekend/After Hours: [phone]

## 2018-12-04 NOTE — Consult Note (Signed)
Cardiology Consultation:   Patient ID: Glenn [name]; [id number]; 1951-01-05   Admit date: 12/02/2018 Date of Consult: 12/04/2018  Primary Care Provider: [name], MD Primary Cardiologist: Virl Axe, MD Primary Electrophysiologist:  None   Patient Profile:   Glenn [name] is a 68 y.o. male with a PMH of chronic combined CHF (EF 15-20%), non-ischemic cardiomyopathy (normal coronaries on [facility] 10/2018), s/p ICD, HTN, CVA, hepatitis C, tobacco abuse, cocaine abuse, and non-compliance with medications who is being seen today for the evaluation of atrial fibrillation with RVR at the request of Dr. Mariane Masters.  History of Present Illness:   Glenn [name] has had several hospitalizations over the past 1.5 months. Admitted 10/29/2018-11/06/2018 after presenting for outpatient cardioversion and subsequently developing SOB where he was found to be back in Afib RVR and acute on chronic CHF. He underwent a LHC that admission which revealed normal coronaries. Echo that admission showed EF 15-20%. He was successfully cardioverted prior to discharge and CHF medications were optimized. Unfortunately he was admitted 11/10/2018-11/13/2018 with a late presenting large subacute hemorrhagic stroke. His eliquis was held at discharge with plans to restart following CT head in 2 weeks, however it does not appear this imaging took place. He was discharged to CIR where he remained until his current admission on 12/02/2018 when he developed progressive respiratory distress. He was found to be in atrial fibrillation with RVR with rates up to 180s, febrile to 102, started on a diltiazem gtt for management on Afib and broad spectrum antibiotics for possible infection. He was found to have an extensive LLE DVT and under IVC filter placement 12/04/2018 given inability to anticoagulate with recent hemorrhagic stroke. He has remained in atrial fibrillation with RVR this admission. Cardiology asked to evaluate for  atrial fibrillation with RVR.   Past Medical History:  Diagnosis Date   Atrial fibrillation [facility])    CHF (congestive heart failure) ([place])    Hepatitis C    Hypertension    Stroke [facility])    Visit for monitoring Tikosyn therapy 03/26/2017    Past Surgical History:  Procedure Laterality Date   CARDIAC DEFIBRILLATOR PLACEMENT  2015   CARDIOVERSION N/A 10/10/2016   Procedure: CARDIOVERSION;  Surgeon: Dorothy Spark, MD;  Location: [place];  Service: Cardiovascular;  Laterality: N/A;   CARDIOVERSION N/A 03/27/2017   Procedure: CARDIOVERSION;  Surgeon: Jerline Pain, MD;  Location: [place];  Service: Cardiovascular;  Laterality: N/A;   CARDIOVERSION N/A 10/29/2018   Procedure: CARDIOVERSION;  Surgeon: Sanda Klein, MD;  Location: [place];  Service: Cardiovascular;  Laterality: N/A;   CARDIOVERSION N/A 11/05/2018   Procedure: CARDIOVERSION;  Surgeon: Nahser, Wonda Cheng, MD;  Location: [place];  Service: Cardiovascular;  Laterality: N/A;   EYE SURGERY Left 1990   IR IVC FILTER PLMT / S&I /IMG GUID/MOD SED  12/04/2018   IR PERCUTANEOUS ART THROMBECTOMY/INFUSION INTRACRANIAL INC DIAG ANGIO  09/05/2016   IR RADIOLOGIST EVAL & MGMT  10/03/2016   RADIOLOGY WITH ANESTHESIA N/A 09/05/2016   Procedure: RADIOLOGY WITH ANESTHESIA;  Surgeon: Luanne Bras, MD;  Location: [place];  Service: Radiology;  Laterality: N/A;   RIGHT/LEFT HEART CATH AND CORONARY ANGIOGRAPHY N/A 11/03/2018   Procedure: RIGHT/LEFT HEART CATH AND CORONARY ANGIOGRAPHY;  Surgeon: Lorretta Harp, MD;  Location: [place] CV LAB;  Service: Cardiovascular;  Laterality: N/A;     Home Medications:  Prior to Admission medications   Medication Sig Start Date End Date Taking? Authorizing Provider  acetaminophen (TYLENOL) 325 MG tablet  Take 2 tablets (650 mg total) by mouth every 4 (four) hours as needed for mild pain (or temp > 37.5 C (99.5 F)). 12/02/18   Angiulli, Lavon Paganini, PA-C  atorvastatin  (LIPITOR) 20 MG tablet Take 1 tablet (20 mg total) by mouth daily at 6 PM. 11/13/18   Donzetta Starch, NP  carvedilol (COREG) 12.5 MG tablet Take 1 tablet (12.5 mg total) by mouth 2 (two) times daily with a meal. 12/02/18   Angiulli, Lavon Paganini, PA-C  ceFEPIme 2 g in sodium chloride 0.9 % 100 mL Inject 2 g into the vein every 8 (eight) hours. 12/02/18   Angiulli, Lavon Paganini, PA-C  ciprofloxacin (CILOXAN) 0.3 % ophthalmic solution Place 1 drop into the left eye 4 (four) times daily. Administer 1 drop, every 2 hours, while awake, for 2 days. Then 1 drop, every 4 hours, while awake, for the next 5 days. 12/02/18   Angiulli, Lavon Paganini, PA-C  divalproex (DEPAKOTE SPRINKLE) 125 MG capsule Take 1 capsule (125 mg total) by mouth every 12 (twelve) hours. 12/02/18   Angiulli, Lavon Paganini, PA-C  furosemide (LASIX) 40 MG tablet Take 1 tablet (40 mg total) by mouth daily. 11/06/18   Mullis, Kiersten P, DO  gabapentin (NEURONTIN) 300 MG capsule TAKE 1 CAPSULE(300 MG) BY MOUTH THREE TIMES DAILY Patient taking differently: Take 300 mg by mouth 3 (three) times daily.  10/10/18   [name], MD  lidocaine (LIDODERM) 5 % Place 1 patch onto the skin daily. Remove & Discard patch within 12 hours or as directed by MD 12/03/18   Angiulli, Lavon Paganini, PA-C  lidocaine (LMX) 4 % cream Apply topically 3 (three) times daily. 12/02/18   Angiulli, Lavon Paganini, PA-C  modafinil (PROVIGIL) 100 MG tablet Take 1 tablet (100 mg total) by mouth daily at 6 (six) AM. 12/03/18   Angiulli, Lavon Paganini, PA-C  potassium chloride SA (K-DUR) 20 MEQ tablet Take 1 tablet (20 mEq total) by mouth daily. 11/06/18   Mullis, Kiersten P, DO  sacubitril-valsartan (ENTRESTO) 24-26 MG Take 1 tablet by mouth 2 (two) times daily. 11/06/18   Mullis, Kiersten P, DO  spironolactone (ALDACTONE) 25 MG tablet Take 0.5 tablets (12.5 mg total) by mouth daily. 12/03/18   Angiulli, Lavon Paganini, PA-C  topiramate (TOPAMAX) 25 MG tablet Take 1 tablet (25 mg total) by mouth at bedtime.  12/02/18   Angiulli, Lavon Paganini, PA-C  traMADol (ULTRAM) 50 MG tablet Take 1 tablet (50 mg total) by mouth every 8 (eight) hours as needed for severe pain. 12/02/18   Angiulli, Lavon Paganini, PA-C    Inpatient Medications: Scheduled Meds:  amiodarone  150 mg Intravenous Once   chlorhexidine  15 mL Mouth Rinse BID   Chlorhexidine Gluconate Cloth  6 each Topical Daily   [START ON 12/05/2018] feeding supplement (PRO-STAT SUGAR FREE 64)  30 mL Per Tube Daily   fentaNYL       ipratropium  0.5 mg Nebulization Q6H   levalbuterol  0.63 mg Nebulization Q6H   lidocaine       mouth rinse  15 mL Mouth Rinse q12n4p   Continuous Infusions:  acetaminophen Stopped (12/04/18 0215)   amiodarone     Followed by   Derrill Memo ON 12/05/2018] amiodarone     [START ON 12/05/2018] feeding supplement (OSMOLITE 1.5 CAL)     piperacillin-tazobactam (ZOSYN)  IV 3.375 g (12/04/18 1341)   PRN Meds: acetaminophen, acetaminophen, bisacodyl, morphine injection  Allergies:    Allergies  Allergen Reactions   Benadryl [  Diphenhydramine] Palpitations    Social History:   Social History   Socioeconomic History   Marital status: Single    Spouse name: Not on file   Number of children: Not on file   Years of education: 68 (some college)   Highest education level: Not on file  Occupational History   Occupation: disability  Social Designer, fashion/clothing strain: Not on file   Food insecurity    Worry: Not on file    Inability: Not on file   Transportation needs    Medical: Not on file    Non-medical: Not on file  Tobacco Use   Smoking status: Current Some Day Smoker    Packs/day: 0.50    Types: Cigarettes   Smokeless tobacco: Never Used   Tobacco comment: a pack last three days  Substance and Sexual Activity   Alcohol use: Not Currently    Alcohol/week: 3.0 standard drinks    Types: 3 Cans of beer per week    Comment: pt stop drinking    Drug use: Yes    Frequency: 2.0 times  per week    Types: Marijuana    Comment: stop smoking    Sexual activity: Yes    Partners: Female    Birth control/protection: Condom  Lifestyle   Physical activity    Days per week: Not on file    Minutes per session: Not on file   Stress: Not on file  Relationships   Social connections    Talks on phone: Not on file    Gets together: Not on file    Attends religious service: Not on file    Active member of club or organization: Not on file    Attends meetings of clubs or organizations: Not on file    Relationship status: Not on file   Intimate partner violence    Fear of current or ex partner: Not on file    Emotionally abused: Not on file    Physically abused: Not on file    Forced sexual activity: Not on file  Other Topics Concern   Not on file  Social History Narrative   Not on file    Family History:    Family History  Problem Relation Age of Onset   High blood pressure Mother    High blood pressure Father    Stroke Maternal Aunt    Heart disease Neg Hx      ROS:  Please see the history of present illness.   All other ROS reviewed and negative.     Physical Exam/Data:   Vitals:   12/04/18 1600 12/04/18 1700 12/04/18 1759 12/04/18 1800  BP: 118/67 124/62 124/62 (!) 146/74  Pulse: 99 99 (!) 117 (!) 58  Resp: (!) 37 (!) 29 (S) (!) 45 (!) 45  Temp:  98.5 F (36.9 C)    TempSrc:  Oral    SpO2: 98% 99% 100% 100%    Intake/Output Summary (Last 24 hours) at 12/04/2018 1813 Last data filed at 12/04/2018 1800 Gross per 24 hour  Intake 739.83 ml  Output 620 ml  Net 119.83 ml   There were no vitals filed for this visit. There is no height or weight on file to calculate BMI.  General:  Ill appearing gentleman sitting upright in bed with BiPAP on in NAD.  HEENT: sclera anicteric  Neck: no JVD Vascular: No carotid bruits; distal pulses 2+ bilaterally Cardiac:  normal S1, S2; IRIR; no murmurs, rubs, or gallops  Lungs:  Tachypneic, decreased  breath sounds but no obvious wheezing/rhonchi/rales Abd: NABS, soft, nontender, no hepatomegaly Ext: LLE edema Musculoskeletal:  No deformities Skin: warm and dry  Neuro:  CNs 2-12 intact, no focal abnormalities noted Psych:  Normal affect   EKG:  The EKG was personally reviewed and demonstrates:  None this admission Telemetry:  Telemetry was personally reviewed and demonstrates:  Atrial fibrillation with RVR with rates primarily in the 100s-110s  Relevant CV Studies: Right/Left heart catheterization 10/2018: IMPRESSION: Mr. Amelio has clean coronary arteries, elevated filling pressures consistent with a nonischemic cardiomyopathy with acute on chronic systolic heart failure.  He is in A. fib with RVR.  He will need pharmacologic optimization and initiation of diuretic therapy for LVEDP and elevated filling pressures.  He already has an ICD in place.  The sheath was removed and a TR band was placed on the right wrist to achieve patent hemostasis.  The Swan-Ganz catheter was removed as well as the antecubital sheath.  The patient left lab in stable condition.  Dr. Fransico Him was notified of these results.  Echocardiogram 10/2018: 1. The left ventricle has severely reduced systolic function, with an ejection fraction of 15-20%. The cavity size was moderately dilated. There is moderate concentric left ventricular hypertrophy. Left ventricular diastolic function could not be  evaluated secondary to atrial fibrillation. Left ventricular diffuse hypokinesis.  2. The right ventricle has moderately reduced systolic function. The cavity was moderately enlarged. There is no increase in right ventricular wall thickness. Right ventricular systolic pressure is moderately elevated with an estimated pressure of 45.5  mmHg.  3. Left atrial size was moderately dilated.  4. Right atrial size was severely dilated.  5. Tricuspid valve regurgitation is severe.  6. Mild thickening of the aortic valve. Aortic  valve regurgitation is moderate by color flow Doppler.  7. The aorta is normal unless otherwise noted.  Laboratory Data:  Chemistry Recent Labs  Lab 12/02/18 2209 12/03/18 0225 12/03/18 0856 12/04/18 0333  NA 136  --  138 138  K 6.1*  --  5.2* 4.5  CL 103  --  105 103  CO2 22  --  24 23  GLUCOSE 136*  --  139* 138*  BUN 17  --  17 16  CREATININE 1.66* 1.55* 1.59* 1.49*  CALCIUM 8.5*  --  8.6* 8.8*  GFRNONAA 42* 46* 45* 48*  GFRAA 49* 53* 52* 56*  ANIONGAP 11  --  9 12    Recent Labs  Lab 12/01/18 0726 12/02/18 1528 12/02/18 2209  PROT 6.2* 7.4 6.5  ALBUMIN 2.6* 3.1* 2.7*  AST 24 25 26   ALT 14 16 16   ALKPHOS 55 68 61  BILITOT 0.5 0.8 0.8   Hematology Recent Labs  Lab 12/02/18 1528 12/02/18 2018 12/02/18 2202 12/04/18 0333  WBC 11.3* 15.6*  --  17.3*  RBC 3.97* 3.82*  --  3.49*  HGB 12.6* 12.3* 10.9* 11.2*  HCT 38.0* 36.1* 32.0* 33.5*  MCV 95.7 94.5  --  96.0  MCH 31.7 32.2  --  32.1  MCHC 33.2 34.1  --  33.4  RDW 12.1 12.5  --  12.5  PLT 276 278  --  214   Cardiac EnzymesNo results for input(s): TROPONINI in the last 168 hours. No results for input(s): TROPIPOC in the last 168 hours.  BNP Recent Labs  Lab 11/28/18 0551 12/02/18 2032  BNP 916.4* 566.2*    DDimer No results for input(s): DDIMER in the last 168 hours.  Radiology/Studies:  Ct Abdomen Pelvis Wo Contrast  Result Date: 12/02/2018 CLINICAL DATA:  Abdominal pain. Fever. Right flank pain. Assess for retroperitoneal bleeding. EXAM: CT ABDOMEN AND PELVIS WITHOUT CONTRAST TECHNIQUE: Multidetector CT imaging of the abdomen and pelvis was performed following the standard protocol without IV contrast. COMPARISON:  None. FINDINGS: Lower chest: Small right pleural effusion layering dependently. Mild atelectasis or infiltrate at the right lung base. Hepatobiliary: Liver parenchyma appears normal. There is vicarious excretion of contrast with opacification of the gallbladder. There are probably some  pigment stones within the gallbladder. Pancreas: Normal Spleen: Normal Adrenals/Urinary Tract: Adrenal glands are normal. Kidneys are normal. Bladder is normal. Stomach/Bowel: No acute or significant bowel finding. Vascular/Lymphatic: Aortic atherosclerosis. No aneurysm. IVC is normal. No retroperitoneal adenopathy. No retroperitoneal hematoma. Reproductive: Normal Other: None Musculoskeletal: Chronic degenerative changes of the lumbar spine. IMPRESSION: 1. No evidence of retroperitoneal hematoma. 2. Small right pleural effusion layering dependently. Mild atelectasis or infiltrate at the right lung base. 3. Aortic atherosclerosis. 4. Gallstones in the gallbladder but no CT evidence of cholecystitis. 5. No intraperitoneal abscess.  No acute bowel pathology evident. Aortic Atherosclerosis (ICD10-I70.0). Electronically Signed   By: Nelson Chimes M.D.   On: 12/02/2018 19:52   Dg Chest 2 View  Result Date: 12/02/2018 CLINICAL DATA:  Shortness of breath EXAM: CHEST - 2 VIEW COMPARISON:  11/27/2018 FINDINGS: Left AICD remains in place, unchanged. Cardiomegaly. No confluent opacities, effusions or edema. No acute bony abnormality. IMPRESSION: Cardiomegaly.  No active disease. Electronically Signed   By: Rolm Baptise M.D.   On: 12/02/2018 16:31   Dg Abd 1 View  Result Date: 12/02/2018 CLINICAL DATA:  Right side abdominal pain and nausea for 1 week. EXAM: ABDOMEN - 1 VIEW COMPARISON:  None. FINDINGS: The bowel gas pattern is normal. No radio-opaque calculi or other significant radiographic abnormality are seen. IMPRESSION: Negative exam. Electronically Signed   By: Inge Rise M.D.   On: 12/02/2018 13:09   Ir Ivc Filter Plmt / S&i /img Guid/mod Sed  Result Date: 12/04/2018 INDICATION: Acute occlusive left lower extremity DVT in the setting of acute hemorrhagic right MCA infarct. Patient cannot be anticoagulated EXAM: ULTRASOUND GUIDANCE FOR VASCULAR ACCESS IVC CATHETERIZATION AND VENOGRAM IVC FILTER  INSERTION Date:  12/04/2018 12/04/2018 3:44 pm Radiologist:  Jerilynn Mages. Daryll Brod, MD Guidance:  Ultrasound and fluoroscopic CONTRAST:  50 cc Omnipaque 300 MEDICATIONS: 1% lidocaine local ANESTHESIA/SEDATION: 0 mg IV Versed; 50 mcg IV Fentanyl Moderate Sedation Time: None. The patient was continuously monitored during the procedure by the interventional radiology nurse under my direct supervision. FLUOROSCOPY TIME:  Fluoroscopy Time: 1 minutes 18 seconds (103 mGy). COMPLICATIONS: None immediate. PROCEDURE: Informed consent was obtained from the patient following explanation of the procedure, risks, benefits and alternatives. The patient understands, agrees and consents for the procedure. All questions were addressed. A time out was performed. Maximal barrier sterile technique utilized including caps, mask, sterile gowns, sterile gloves, large sterile drape, hand hygiene, and betadine prep. Under sterile condition and local anesthesia, right internal jugular venous access was performed with ultrasound. Over a guide wire, the IVC filter delivery sheath and inner dilator were advanced into the IVC just above the IVC bifurcation. Contrast injection was performed for an IVC venogram. IVC VENOGRAM: The IVC is patent. No evidence of thrombus, stenosis, or occlusion. No variant venous anatomy. The renal veins are identified at L1-2. IVC FILTER INSERTION: Through the delivery sheath, the Bard Denali IVC filter was deployed in the infrarenal IVC at the L2-3  level just below the renal veins and above the IVC bifurcation. Contrast injection confirmed position. There is good apposition of the filter against the IVC. The delivery sheath was removed and hemostasis was obtained with compression for 5 minutes. The patient tolerated the procedure well. No immediate complications. IMPRESSION: Ultrasound and fluoroscopically guided infrarenal IVC filter insertion. PLAN: This IVC filter is potentially retrievable. The patient will be  assessed for filter retrieval by Interventional Radiology in approximately 8-12 weeks. Further recommendations regarding filter retrieval, continued surveillance or declaration of device permanence, will be made at that time. Electronically Signed   By: Jerilynn Mages.  Shick M.D.   On: 12/04/2018 15:53   Dg Chest Port 1 View  Result Date: 12/04/2018 CLINICAL DATA:  Right sided abd pain with sob EXAM: PORTABLE CHEST 1 VIEW COMPARISON:  12/02/2018 FINDINGS: Patient has LEFT-sided transvenous pacemaker with lead to the RIGHT ventricle. The heart is enlarged. Study quality is degraded by patient motion artifact.There is increased opacity at the RIGHT lung base which partially obscures the RIGHT hemidiaphragm and is consistent with pleural effusion and atelectasis or infiltrate. IMPRESSION: Increased opacity at the RIGHT lung base consistent with pleural effusion and atelectasis or infiltrate. Electronically Signed   By: [name] M.D.   On: 12/04/2018 11:45   Dg Chest Port 1 View  Result Date: 12/02/2018 CLINICAL DATA:  Respiratory failure EXAM: PORTABLE CHEST 1 VIEW COMPARISON:  12/02/2018, 11/27/2018 FINDINGS: Left-sided pacing device as before. Cardiomegaly with central vascular congestion and aortic atherosclerosis. No consolidation, pleural effusion or pneumothorax. IMPRESSION: Cardiomegaly with central vascular congestion Electronically Signed   By: Donavan Foil M.D.   On: 12/02/2018 22:25   Dg Abd Portable 1v  Result Date: 12/04/2018 CLINICAL DATA:  Right side abdominal pain EXAM: PORTABLE ABDOMEN - 1 VIEW COMPARISON:  12/02/2018 FINDINGS: Mild diffuse gaseous distention of bowel may reflect mild ileus. No convincing evidence for bowel obstruction. No organomegaly, suspicious calcification or free air. No acute bony abnormality. IMPRESSION: Mild gaseous distention of bowel diffusely could reflect mild ileus. Electronically Signed   By: Rolm Baptise M.D.   On: 12/04/2018 11:44   Vas [place] Lower Extremity  Venous (dvt)  Result Date: 12/03/2018  Lower Venous Study Indications: Swelling, and respiratory failure.  Comparison Study: No prior study Performing Technologist: Maudry Mayhew MHA, RDMS, RVT, RDCS  Examination Guidelines: A complete evaluation includes B-mode imaging, spectral Doppler, color Doppler, and power Doppler as needed of all accessible portions of each vessel. Bilateral testing is considered an integral part of a complete examination. Limited examinations for reoccurring indications may be performed as noted.  +---------+---------------+---------+-----------+----------+--------------+  RIGHT     Compressibility Phasicity Spontaneity Properties Thrombus Aging  +---------+---------------+---------+-----------+----------+--------------+  CFV       Full            Yes       Yes                                    +---------+---------------+---------+-----------+----------+--------------+  SFJ       Full                                                             +---------+---------------+---------+-----------+----------+--------------+  FV Prox   Full                                                             +---------+---------------+---------+-----------+----------+--------------+  FV Mid    Full                                                             +---------+---------------+---------+-----------+----------+--------------+  FV Distal Full                                                             +---------+---------------+---------+-----------+----------+--------------+  PFV       Full                                                             +---------+---------------+---------+-----------+----------+--------------+  POP       Full            Yes       Yes                                    +---------+---------------+---------+-----------+----------+--------------+  PTV       Full                                                              +---------+---------------+---------+-----------+----------+--------------+  PERO      Full                                                             +---------+---------------+---------+-----------+----------+--------------+  +---------+---------------+---------+-----------+----------+--------------+  LEFT      Compressibility Phasicity Spontaneity Properties Thrombus Aging  +---------+---------------+---------+-----------+----------+--------------+  CFV       Partial                   No                     Acute           +---------+---------------+---------+-----------+----------+--------------+  SFJ       Partial                                          Acute           +---------+---------------+---------+-----------+----------+--------------+  FV Prox   None                                             Acute           +---------+---------------+---------+-----------+----------+--------------+  FV Mid    None                                             Acute           +---------+---------------+---------+-----------+----------+--------------+  FV Distal None                                             Acute           +---------+---------------+---------+-----------+----------+--------------+  PFV       None                                             Acute           +---------+---------------+---------+-----------+----------+--------------+  POP       None                      No                     Acute           +---------+---------------+---------+-----------+----------+--------------+  PTV       None                      No                     Acute           +---------+---------------+---------+-----------+----------+--------------+  PERO      None                      No                     Acute           +---------+---------------+---------+-----------+----------+--------------+  Gastroc   None                      No                     Acute            +---------+---------------+---------+-----------+----------+--------------+  EIV                                 Yes                                    +---------+---------------+---------+-----------+----------+--------------+  Unable to evaluate left common iliac vein and IVC due to patient position.  Summary: Right: There is no evidence of deep vein thrombosis in the lower extremity. No cystic structure found in the popliteal fossa. Left: Findings consistent with acute deep vein thrombosis involving the left common femoral vein, left femoral vein, left popliteal vein, left proximal profunda vein, left posterior tibial veins, left peroneal veins, and left gastrocnemius veins. No cystic structure found in the popliteal fossa.  *See table(s) above for measurements and observations. Electronically signed by Curt Jews MD  on 12/03/2018 at 3:04:32 PM.    Final     Assessment and Plan:   1. Atrial fibrillation with RVR: unfortunately he is back in Afib RVR after several recent cardioversions and attempts at antiarrhythmics. Likely driven by acute respiratory failure, possible aspiration PNA, DVT, and possible PE. Not a candidate for anticoagulation given recent hemorrhagic stroke despite elevated stroke risk with CHA2DS2-VASc Score of 5 (CHF, HTN, Age 56-74, and prior stroke/DVT). He was started on a diltiazem gtt 12/04/2018 with rates improved from 180s to 100s-110s.  - Will stop diltiazem given severe LV dysfunction - Start amiodarone gtt (QTc 465 on last EKG)  - Monitor EKG closely for changes in QTc - BP overall stable - could consider addition of IV metoprolol for rate control vs restarting carvedilol once cleared to take po   2. Chronic combined CHF/ non-ischemic cardiomyopathy s/p ICD: EF 15-20% on echo 10/2018. CXR without overt edema. He has LLE edema 2/2 DVT but no RLE edema. Home medications on hold given sepsis and NPO status - Monitor volume status closely with goal to maintain net nil volume  status.  - Resume home carvedilol, entresto, spironolactone, and lasix when tolerating PO and if BP/Cr will allow.  3. Acute respiratory failure: likely 2/2 aspiration PNA +/- PE. S/p IVC filter today. On BiPAP. Still quite tachypneic. On IV antibiotics - Continue management per primary team  4. Acute LLE DVT: noted on dopplers this admission. Not a candidate for anticoagulation given recent hemorrhagic stroke. S/p IVC filter placement today.   5. CKD stage 3: Cr 1.4 today - suspect this may be new baseline - Continue to monitor closely  6. Hx of recent hemorrhagic stroke: residual left sided weakness, facial droop, and dysphagia. High risk for aspiration per SLP eval today - Continue management per primary team  For questions or updates, please contact [place] Please consult [email or web address] for contact info under Cardiology/STEMI.   Signed, Abigail Butts, PA-C  12/04/2018 6:13 PM [phone]

## 2018-12-04 NOTE — Sedation Documentation (Signed)
Shick, MD aware of patient lab values, inability to obtain airway by PA due to patient refusing to open mouth and patient drinking and eating a few hours ago. Case will be without sedation.

## 2018-12-04 NOTE — Procedures (Signed)
Acute occlusive DVT, acute hemrrhagic Rt MCA infarct  S/p IVC filter insertion  No comp Stable ebl min Full report in pacs

## 2018-12-04 NOTE — TOC Initial Note (Signed)
Transition of Care [facility]) - Initial/Assessment Note    Patient Details  Name: Glenn [name] MRN: [id number] Date of Birth: February 03, 1951  [facility]) CM/SW Contact:    Carles Collet, RN Phone Number: 12/04/2018, 2:39 PM  Clinical Narrative:                Patient admitted from CIR, after rapid response.  CM will continue to follow        Patient Goals and CMS Choice        Expected Discharge Plan and Services                                                Prior Living Arrangements/Services                       Activities of Daily Living      Permission Sought/Granted                  Emotional Assessment              Admission diagnosis:  Sepsis Rapid A Fib Patient Active Problem List   Diagnosis Date Noted  . Rapid atrial fibrillation ([name]) 12/02/2018  . Acute blood loss anemia   . Labile blood glucose   . AKI (acute kidney injury) ([name])   . Chronic right hip pain   . Acute renal failure superimposed on stage 3a chronic kidney disease ([name])   . Cough with fever   . Headache due to intracranial disease 11/14/2018  . Trochanteric bursitis, right hip 11/14/2018  . Cerebral edema ([name]) 11/13/2018  . Hyperlipidemia LDL goal <70 11/13/2018  . Marijuana user 11/13/2018  . Right middle cerebral artery stroke ([name]) 11/13/2018  . Acute CVA (cerebrovascular accident) ([place])   . Noncompliance   . Dysphagia, post-stroke   . Acute on chronic combined systolic and diastolic CHF (congestive heart failure) ([name])   . Wide-complex tachycardia ([name])   . DCM (dilated cardiomyopathy) ([name])   . Atrial fibrillation ([name]) 11/04/2018  . Acute on chronic systolic heart failure ([name])   . Prolonged Q-T interval on ECG   . Elevated troponin   . CHF exacerbation ([name]) 10/30/2018  . Acute respiratory failure with hypoxia ([name])   . Acute kidney injury ([name])   . Entrapment of right ulnar nerve 02/28/2018  . Carpal tunnel syndrome of  right wrist 02/28/2018  . Impotence due to erectile dysfunction 09/30/2017  . Solitary pulmonary nodule 06/10/2017  . Neck pain 04/06/2017  . Paroxysmal atrial fibrillation ([name])   . Visit for monitoring Tikosyn therapy 03/25/2017  . ICD (implantable cardioverter-defibrillator) in place 09/13/2016  . Chest pain 09/13/2016  . Tobacco abuse 09/13/2016  . Hospital discharge follow-up 09/13/2016  . Upper back pain 09/13/2016  . Housing problems 09/13/2016  . Persistent atrial fibrillation ([place])   . Atrial fibrillation with RVR ([name])   . Ischemic cardiomyopathy   . Cerebral infarction ([name])   . Stroke (cerebrum) ([name]) Lg L MCA infarct w/ hemorrhagic conversion, embolic d/t AF [id number]  . CHF (congestive heart failure) ([name]) 08/14/2016  . HTN (hypertension) 08/14/2016  . Chronic Hepatitis C  08/14/2016   PCP:  [name], MD Pharmacy:   [facility] [place] [address] [place] [address]  Phone: [phone] Fax: [phone]  [facility] [phone] - [place], [place] - [address] [place] [address] Phone: [phone] Fax: [phone]     Social Determinants of Health (SDOH) Interventions    Readmission Risk Interventions No flowsheet data found.

## 2018-12-04 NOTE — Progress Notes (Signed)
eLink Physician-Brief Progress Note Patient Name: RILO TRAGER DOB: 1951-01-23 MRN: [id number]   Date of Service  12/04/2018  HPI/Events of Note  Patient with fever and pain.  eICU Interventions  Tylenol iv prn fever or pain x 6 doses.        Kerry Kass Aryiana Klinkner 12/04/2018, 12:33 AM

## 2018-12-04 NOTE — Progress Notes (Signed)
eLink Physician-Brief Progress Note Patient Name: Glenn [name] DOB: [birth year] MRN: [id number]   Date of Service  12/04/2018  HPI/Events of Note  Left upper extremity swelling. He has a known lower extremity DVT and had an IVC filter placed earlier today due to anticoagulation risk from recent hemorrhagic CVA.  eICU Interventions  Left upper extremity doppler US ordered to r/o DVT        Rayanne Padmanabhan U Salah Nakamura 12/04/2018, 8:29 PM

## 2018-12-04 NOTE — Progress Notes (Signed)
Settling down with morphine and BIPAP in place

## 2018-12-05 ENCOUNTER — Inpatient Hospital Stay (HOSPITAL_COMMUNITY): Payer: Medicare HMO

## 2018-12-05 DIAGNOSIS — M7989 Other specified soft tissue disorders: Secondary | ICD-10-CM

## 2018-12-05 DIAGNOSIS — I255 Ischemic cardiomyopathy: Secondary | ICD-10-CM

## 2018-12-05 DIAGNOSIS — I6930 Unspecified sequelae of cerebral infarction: Secondary | ICD-10-CM

## 2018-12-05 LAB — BRAIN NATRIURETIC PEPTIDE: B Natriuretic Peptide: 450 pg/mL — ABNORMAL HIGH (ref 0.0–100.0)

## 2018-12-05 LAB — URINALYSIS, ROUTINE W REFLEX MICROSCOPIC
Bilirubin Urine: NEGATIVE
Glucose, UA: NEGATIVE mg/dL
Ketones, ur: NEGATIVE mg/dL
Leukocytes,Ua: NEGATIVE
Nitrite: NEGATIVE
Protein, ur: 100 mg/dL — AB
Specific Gravity, Urine: 1.04 — ABNORMAL HIGH (ref 1.005–1.030)
pH: 5 (ref 5.0–8.0)

## 2018-12-05 LAB — CBC WITH DIFFERENTIAL/PLATELET
Abs Immature Granulocytes: 0.21 10*3/uL — ABNORMAL HIGH (ref 0.00–0.07)
Basophils Absolute: 0 10*3/uL (ref 0.0–0.1)
Basophils Relative: 0 %
Eosinophils Absolute: 0.1 10*3/uL (ref 0.0–0.5)
Eosinophils Relative: 1 %
HCT: 33.9 % — ABNORMAL LOW (ref 39.0–52.0)
Hemoglobin: 11.3 g/dL — ABNORMAL LOW (ref 13.0–17.0)
Immature Granulocytes: 1 %
Lymphocytes Relative: 7 %
Lymphs Abs: 1.1 10*3/uL (ref 0.7–4.0)
MCH: 32 pg (ref 26.0–34.0)
MCHC: 33.3 g/dL (ref 30.0–36.0)
MCV: 96 fL (ref 80.0–100.0)
Monocytes Absolute: 1.5 10*3/uL — ABNORMAL HIGH (ref 0.1–1.0)
Monocytes Relative: 9 %
Neutro Abs: 13 10*3/uL — ABNORMAL HIGH (ref 1.7–7.7)
Neutrophils Relative %: 82 %
Platelets: 213 10*3/uL (ref 150–400)
RBC: 3.53 MIL/uL — ABNORMAL LOW (ref 4.22–5.81)
RDW: 12.5 % (ref 11.5–15.5)
WBC: 15.9 10*3/uL — ABNORMAL HIGH (ref 4.0–10.5)
nRBC: 0 % (ref 0.0–0.2)

## 2018-12-05 LAB — BASIC METABOLIC PANEL
Anion gap: 12 (ref 5–15)
BUN: 19 mg/dL (ref 8–23)
CO2: 23 mmol/L (ref 22–32)
Calcium: 8.4 mg/dL — ABNORMAL LOW (ref 8.9–10.3)
Chloride: 102 mmol/L (ref 98–111)
Creatinine, Ser: 1.33 mg/dL — ABNORMAL HIGH (ref 0.61–1.24)
GFR calc Af Amer: >60 mL/min (ref >60)
GFR calc non Af Amer: 55 mL/min — ABNORMAL LOW (ref >60)
Glucose, Bld: 122 mg/dL — ABNORMAL HIGH (ref 70–99)
Potassium: 3.9 mmol/L (ref 3.5–5.1)
Sodium: 137 mmol/L (ref 135–145)

## 2018-12-05 MED ORDER — POTASSIUM CHLORIDE 20 MEQ/15ML (10%) PO SOLN
20.0000 meq | Freq: Once | ORAL | Status: AC
  Administered 2018-12-05: 20 meq
  Filled 2018-12-05: qty 15

## 2018-12-05 MED ORDER — GABAPENTIN 600 MG PO TABS
300.0000 mg | ORAL_TABLET | Freq: Once | ORAL | Status: DC
  Filled 2018-12-05: qty 0.5

## 2018-12-05 MED ORDER — GABAPENTIN 250 MG/5ML PO SOLN
300.0000 mg | Freq: Once | ORAL | Status: AC
  Administered 2018-12-05: 300 mg
  Filled 2018-12-05: qty 6

## 2018-12-05 MED ORDER — DILTIAZEM HCL-DEXTROSE 125-5 MG/125ML-% IV SOLN (PREMIX)
5.0000 mg/h | INTRAVENOUS | Status: DC
  Administered 2018-12-05: 10:00:00 15 mg/h via INTRAVENOUS
  Administered 2018-12-05: 02:00:00 5 mg/h via INTRAVENOUS
  Filled 2018-12-05 (×2): qty 125

## 2018-12-05 MED ORDER — METOPROLOL TARTRATE 5 MG/5ML IV SOLN
5.0000 mg | Freq: Once | INTRAVENOUS | Status: AC
  Administered 2018-12-05: 5 mg via INTRAVENOUS
  Filled 2018-12-05: qty 5

## 2018-12-05 MED ORDER — GABAPENTIN 300 MG PO CAPS: 300.0000 mg | ORAL_CAPSULE | Freq: Once | ORAL | Status: DC

## 2018-12-05 MED ORDER — GABAPENTIN 250 MG/5ML PO SOLN
300.0000 mg | Freq: Three times a day (TID) | ORAL | Status: DC
  Administered 2018-12-05 – 2018-12-07 (×5): 300 mg
  Filled 2018-12-05 (×6): qty 6

## 2018-12-05 NOTE — Progress Notes (Signed)
eLink Physician-Brief Progress Note Patient Name: Glenn [name] DOB: [birth year] MRN: [id number]   Date of Service  12/05/2018  HPI/Events of Note  Afib with RVR persists with HR > 140 despite Amiodarone infusion.  eICU Interventions  Cardizem infusion started for rate control.        [name] U Elektra Wartman 12/05/2018, 2:10 AM

## 2018-12-05 NOTE — Procedures (Signed)
Cortrak  Person Inserting Tube:  Rosangelica Pevehouse, RD Tube Type:  Cortrak - 43 inches Tube Location:  Right nare Initial Placement:  Stomach Secured by: Bridle Technique Used to Measure Tube Placement:  Documented cm marking at nare/ corner of mouth Cortrak Secured At:  63 cm   X-ray is required, abdominal x-ray has been ordered by the Cortrak team. Please confirm tube placement before using the Cortrak tube.   If the tube becomes dislodged please keep the tube and contact the Cortrak team at [email or web address] (password [email or web address]) for replacement.  If after hours and replacement cannot be delayed, place a NG tube and confirm placement with an abdominal x-ray.    Calixto Pavel RD, LDN Clinical Nutrition Pager # - [phone]    

## 2018-12-05 NOTE — Progress Notes (Signed)
Progress Note  Patient Name: Glenn [name] Date of Encounter: 12/05/2018  Primary Cardiologist: Virl Axe, MD   Subjective   Off NIPPV on my interview, but unable to have more than brief answers to questions. Denies pain, feels better.   Inpatient Medications    Scheduled Meds:  chlorhexidine  15 mL Mouth Rinse BID   Chlorhexidine Gluconate Cloth  6 each Topical Daily   feeding supplement (PRO-STAT SUGAR FREE 64)  30 mL Per Tube Daily   ipratropium  0.5 mg Nebulization Q6H   levalbuterol  0.63 mg Nebulization Q6H   mouth rinse  15 mL Mouth Rinse q12n4p   Continuous Infusions:  amiodarone 30 mg/hr (12/05/18 0900)   feeding supplement (OSMOLITE 1.5 CAL) 1,000 mL (12/05/18 1237)   piperacillin-tazobactam (ZOSYN)  IV 3.375 g (12/05/18 1233)   PRN Meds: acetaminophen, bisacodyl, morphine injection   Vital Signs    Vitals:   12/05/18 0827 12/05/18 0900 12/05/18 1000 12/05/18 1100  BP:   (!) 128/99 132/73  Pulse:  (!) 33 91 (!) 102  Resp:  (!) 30 (!) 28 (!) 30  Temp:      TempSrc:      SpO2: 94% 98% 93% 95%  Weight:        Intake/Output Summary (Last 24 hours) at 12/05/2018 1239 Last data filed at 12/05/2018 0900 Gross per 24 hour  Intake 809.79 ml  Output 990 ml  Net -180.21 ml   Last 3 Weights 12/05/2018 12/02/2018 12/01/2018  Weight (lbs) 187 lb 2.7 oz 190 lb 7.6 oz 187 lb 9.8 oz  Weight (kg) 84.9 kg 86.4 kg 85.1 kg      Telemetry    Atrial fibrillation, rate slowing to 100 bpm - Personally Reviewed  ECG    No new since 10/13 - Personally Reviewed  Physical Exam   GEN: Resting in bed, ill appearing, nasal cannula and feeding tube in place Neck: JVD just at clavicle at 45 degrees Cardiac: tachycardic, irregularly irregular, no murmurs, rubs, or gallops.  Respiratory: diffusely coarse with rales/rhonchi GI: Soft, nontender, non-distended  MS: bilateral LE pitting edema, warm to cool extremities Neuro:  l sided facial droop, did not  cooperate with strength exam Psych: minimally interactive  Labs    High Sensitivity Troponin:   Recent Labs  Lab 12/02/18 2209 12/03/18 0225  TROPONINIHS 26* 25*      Chemistry Recent Labs  Lab 12/01/18 0726 12/02/18 1528  12/02/18 2209  12/03/18 0856 12/04/18 0333 12/05/18 1029  NA 139 137   < > 136  --  138 138 137  K 4.3 4.9   < > 6.1*  --  5.2* 4.5 3.9  CL 104 102  --  103  --  105 103 102  CO2 25 23  --  22  --  24 23 23   GLUCOSE 115* 149*  --  136*  --  139* 138* 122*  BUN 11 15  --  17  --  17 16 19   CREATININE 1.07 1.39*  --  1.66*   < > 1.59* 1.49* 1.33*  CALCIUM 9.0 9.2  --  8.5*  --  8.6* 8.8* 8.4*  PROT 6.2* 7.4  --  6.5  --   --   --   --   ALBUMIN 2.6* 3.1*  --  2.7*  --   --   --   --   AST 24 25  --  26  --   --   --   --  ALT 14 16  --  16  --   --   --   --   ALKPHOS 55 68  --  61  --   --   --   --   BILITOT 0.5 0.8  --  0.8  --   --   --   --   GFRNONAA >60 52*  --  42*   < > 45* 48* 55*  GFRAA >60 >60  --  49*   < > 52* 56* >60  ANIONGAP 10 12  --  11  --  9 12 12    < > = values in this interval not displayed.     Hematology Recent Labs  Lab 12/02/18 2018 12/02/18 2202 12/04/18 0333 12/05/18 1029  WBC 15.6*  --  17.3* 15.9*  RBC 3.82*  --  3.49* 3.53*  HGB 12.3* 10.9* 11.2* 11.3*  HCT 36.1* 32.0* 33.5* 33.9*  MCV 94.5  --  96.0 96.0  MCH 32.2  --  32.1 32.0  MCHC 34.1  --  33.4 33.3  RDW 12.5  --  12.5 12.5  PLT 278  --  214 213    BNP Recent Labs  Lab 12/02/18 2032 12/05/18 1029  BNP 566.2* 450.0*     DDimer No results for input(s): DDIMER in the last 168 hours.   Radiology    Ir Ivc Filter Plmt / S&i /img Guid/mod Sed  Result Date: 12/04/2018 INDICATION: Acute occlusive left lower extremity DVT in the setting of acute hemorrhagic right MCA infarct. Patient cannot be anticoagulated EXAM: ULTRASOUND GUIDANCE FOR VASCULAR ACCESS IVC CATHETERIZATION AND VENOGRAM IVC FILTER INSERTION Date:  12/04/2018 12/04/2018 3:44 pm  Radiologist:  Jerilynn Mages. Daryll Brod, MD Guidance:  Ultrasound and fluoroscopic CONTRAST:  50 cc Omnipaque 300 MEDICATIONS: 1% lidocaine local ANESTHESIA/SEDATION: 0 mg IV Versed; 50 mcg IV Fentanyl Moderate Sedation Time: None. The patient was continuously monitored during the procedure by the interventional radiology nurse under my direct supervision. FLUOROSCOPY TIME:  Fluoroscopy Time: 1 minutes 18 seconds (103 mGy). COMPLICATIONS: None immediate. PROCEDURE: Informed consent was obtained from the patient following explanation of the procedure, risks, benefits and alternatives. The patient understands, agrees and consents for the procedure. All questions were addressed. A time out was performed. Maximal barrier sterile technique utilized including caps, mask, sterile gowns, sterile gloves, large sterile drape, hand hygiene, and betadine prep. Under sterile condition and local anesthesia, right internal jugular venous access was performed with ultrasound. Over a guide wire, the IVC filter delivery sheath and inner dilator were advanced into the IVC just above the IVC bifurcation. Contrast injection was performed for an IVC venogram. IVC VENOGRAM: The IVC is patent. No evidence of thrombus, stenosis, or occlusion. No variant venous anatomy. The renal veins are identified at L1-2. IVC FILTER INSERTION: Through the delivery sheath, the Bard Denali IVC filter was deployed in the infrarenal IVC at the L2-3 level just below the renal veins and above the IVC bifurcation. Contrast injection confirmed position. There is good apposition of the filter against the IVC. The delivery sheath was removed and hemostasis was obtained with compression for 5 minutes. The patient tolerated the procedure well. No immediate complications. IMPRESSION: Ultrasound and fluoroscopically guided infrarenal IVC filter insertion. PLAN: This IVC filter is potentially retrievable. The patient will be assessed for filter retrieval by Interventional  Radiology in approximately 8-12 weeks. Further recommendations regarding filter retrieval, continued surveillance or declaration of device permanence, will be made at that time. Electronically Signed  By: Eugenie Filler M.D.   On: 12/04/2018 15:53   Dg Chest Port 1 View  Result Date: 12/04/2018 CLINICAL DATA:  Right sided abd pain with sob EXAM: PORTABLE CHEST 1 VIEW COMPARISON:  12/02/2018 FINDINGS: Patient has LEFT-sided transvenous pacemaker with lead to the RIGHT ventricle. The heart is enlarged. Study quality is degraded by patient motion artifact.There is increased opacity at the RIGHT lung base which partially obscures the RIGHT hemidiaphragm and is consistent with pleural effusion and atelectasis or infiltrate. IMPRESSION: Increased opacity at the RIGHT lung base consistent with pleural effusion and atelectasis or infiltrate. Electronically Signed   By: [name] M.D.   On: 12/04/2018 11:45   Dg Abd Portable 1v  Result Date: 12/05/2018 CLINICAL DATA:  Encounter for feeding tube placement EXAM: PORTABLE ABDOMEN - 1 VIEW COMPARISON:  Yesterday FINDINGS: Feeding tube with tip over the proximal stomach. IVC filter in place. Normal bowel gas pattern. IMPRESSION: Feeding tube with tip over the proximal stomach. Electronically Signed   By: Monte Fantasia M.D.   On: 12/05/2018 11:31   Dg Abd Portable 1v  Result Date: 12/04/2018 CLINICAL DATA:  Right side abdominal pain EXAM: PORTABLE ABDOMEN - 1 VIEW COMPARISON:  12/02/2018 FINDINGS: Mild diffuse gaseous distention of bowel may reflect mild ileus. No convincing evidence for bowel obstruction. No organomegaly, suspicious calcification or free air. No acute bony abnormality. IMPRESSION: Mild gaseous distention of bowel diffusely could reflect mild ileus. Electronically Signed   By: Rolm Baptise M.D.   On: 12/04/2018 11:44   Vas [place] Lower Extremity Venous (dvt)  Result Date: 12/03/2018  Lower Venous Study Indications: Swelling, and respiratory  failure.  Comparison Study: No prior study Performing Technologist: Maudry Mayhew MHA, RDMS, RVT, RDCS  Examination Guidelines: A complete evaluation includes B-mode imaging, spectral Doppler, color Doppler, and power Doppler as needed of all accessible portions of each vessel. Bilateral testing is considered an integral part of a complete examination. Limited examinations for reoccurring indications may be performed as noted.  +---------+---------------+---------+-----------+----------+--------------+  RIGHT     Compressibility Phasicity Spontaneity Properties Thrombus Aging  +---------+---------------+---------+-----------+----------+--------------+  CFV       Full            Yes       Yes                                    +---------+---------------+---------+-----------+----------+--------------+  SFJ       Full                                                             +---------+---------------+---------+-----------+----------+--------------+  FV Prox   Full                                                             +---------+---------------+---------+-----------+----------+--------------+  FV Mid    Full                                                             +---------+---------------+---------+-----------+----------+--------------+  FV Distal Full                                                             +---------+---------------+---------+-----------+----------+--------------+  PFV       Full                                                             +---------+---------------+---------+-----------+----------+--------------+  POP       Full            Yes       Yes                                    +---------+---------------+---------+-----------+----------+--------------+  PTV       Full                                                             +---------+---------------+---------+-----------+----------+--------------+  PERO      Full                                                              +---------+---------------+---------+-----------+----------+--------------+  +---------+---------------+---------+-----------+----------+--------------+  LEFT      Compressibility Phasicity Spontaneity Properties Thrombus Aging  +---------+---------------+---------+-----------+----------+--------------+  CFV       Partial                   No                     Acute           +---------+---------------+---------+-----------+----------+--------------+  SFJ       Partial                                          Acute           +---------+---------------+---------+-----------+----------+--------------+  FV Prox   None                                             Acute           +---------+---------------+---------+-----------+----------+--------------+  FV Mid    None                                             Acute           +---------+---------------+---------+-----------+----------+--------------+  FV Distal None                                             Acute           +---------+---------------+---------+-----------+----------+--------------+  PFV       None                                             Acute           +---------+---------------+---------+-----------+----------+--------------+  POP       None                      No                     Acute           +---------+---------------+---------+-----------+----------+--------------+  PTV       None                      No                     Acute           +---------+---------------+---------+-----------+----------+--------------+  PERO      None                      No                     Acute           +---------+---------------+---------+-----------+----------+--------------+  Gastroc   None                      No                     Acute           +---------+---------------+---------+-----------+----------+--------------+  EIV                                 Yes                                     +---------+---------------+---------+-----------+----------+--------------+  Unable to evaluate left common iliac vein and IVC due to patient position.  Summary: Right: There is no evidence of deep vein thrombosis in the lower extremity. No cystic structure found in the popliteal fossa. Left: Findings consistent with acute deep vein thrombosis involving the left common femoral vein, left femoral vein, left popliteal vein, left proximal profunda vein, left posterior tibial veins, left peroneal veins, and left gastrocnemius veins. No cystic structure found in the popliteal fossa.  *See table(s) above for measurements and observations. Electronically signed by Curt Jews MD on 12/03/2018 at 3:04:32 PM.    Final    Vas [place] Upper Extremity Venous Duplex  Result Date: 12/05/2018 UPPER VENOUS STUDY  Indications: Swelling Risk Factors: Current LE DVT with IVC filter. Performing Technologist: June Leap RDMS, RVT  Examination Guidelines: A complete evaluation includes B-mode imaging, spectral Doppler, color Doppler, and power Doppler as needed of all accessible  portions of each vessel. Bilateral testing is considered an integral part of a complete examination. Limited examinations for reoccurring indications may be performed as noted.  Right Findings: +-----+------------+---------+-----------+----------+--------------------------+  RIGHT Compressible Phasicity Spontaneous Properties          Summary            +-----+------------+---------+-----------+----------+--------------------------+  IJV                                                   unable to image due to                                                                positioning          +-----+------------+---------+-----------+----------+--------------------------+  Left Findings: +----------+------------+---------+-----------+----------+-------+  LEFT       Compressible Phasicity Spontaneous Properties Summary   +----------+------------+---------+-----------+----------+-------+  IJV            Full        Yes        Yes                         +----------+------------+---------+-----------+----------+-------+  Subclavian     Full        Yes        Yes                         +----------+------------+---------+-----------+----------+-------+  Axillary       Full        Yes        Yes                         +----------+------------+---------+-----------+----------+-------+  Brachial       Full        Yes        Yes                         +----------+------------+---------+-----------+----------+-------+  Radial         Full                                               +----------+------------+---------+-----------+----------+-------+  Ulnar          Full                                               +----------+------------+---------+-----------+----------+-------+  Cephalic       None                                      forearm  +----------+------------+---------+-----------+----------+-------+  Basilic        Full                                               +----------+------------+---------+-----------+----------+-------+  Summary:  Left: No evidence of deep vein thrombosis in the upper extremity. Findings consistent with acute superficial vein thrombosis involving the left cephalic vein.  *See table(s) above for measurements and observations.    Preliminary     Cardiac Studies   Right/Left heart catheterization 10/2018: IMPRESSION:Mr. Sparano has clean coronary arteries, elevated filling pressures consistent with a nonischemic cardiomyopathy with acute on chronic systolic heart failure. He is in A. fib with RVR. He will need pharmacologic optimization and initiation of diuretic therapy for LVEDP and elevated filling pressures. He already has an ICD in place. The sheath was removed and a TR band was placed on the right wrist to achieve patent hemostasis. The Swan-Ganz catheter was removed as well as  the antecubital sheath. The patient left lab in stable condition. Dr. Fransico Him was notified of these results.  Echocardiogram 10/2018: 1. The left ventricle has severely reduced systolic function, with an ejection fraction of 15-20%. The cavity size was moderately dilated. There is moderate concentric left ventricular hypertrophy. Left ventricular diastolic function could not be  evaluated secondary to atrial fibrillation. Left ventricular diffuse hypokinesis. 2. The right ventricle has moderately reduced systolic function. The cavity was moderately enlarged. There is no increase in right ventricular wall thickness. Right ventricular systolic pressure is moderately elevated with an estimated pressure of 45.5  mmHg. 3. Left atrial size was moderately dilated. 4. Right atrial size was severely dilated. 5. Tricuspid valve regurgitation is severe. 6. Mild thickening of the aortic valve. Aortic valve regurgitation is moderate by color flow Doppler. 7. The aorta is normal unless otherwise noted.   Patient Profile     68 y.o. male with PMH of chronic combined systolic and diastolic heart failure (EF 15-20%), non-ischemic cardiomyopathy (normal coronaries on [facility] 10/2018), s/p ICD, HTN, hemorrhagic CVA, DVT s/p IVC filter, hepatitis C, tobacco abuse, cocaine abuse who is being followed for the management of atrial fibrillation with RVR at the request of Dr. Mariane Masters.  Assessment & Plan    Atrial fibrillation with RVR:  Chronic combined CHF/ non-ischemic cardiomyopathy s/p ICD Acute respiratory failure with SIRS criteria He is warm to cool on exam today but tolerating being off NIPPV. Rate better controlled with amiodarone. JVD not significantly elevated. Cannot cardiovert due to inability to anticoagulate. Has not responded to beta blockers. No calcium channel blocker due to low EF. Digoxin not ideal as it will be difficult to monitor neurotoxicity. -continue amiodarone IV. With hepatitis C  history, follow LFTs. Transaminases normal 10/13. -antibiotics, bronchodilators, NIPPV per primary team -difficult, complex situation. There is risk of chemical cardioversion, but with limited options and poor response to other therapies, amiodarone is our current best option.  Acute LLE DVT CKD stage 3 Hx of recent hemorrhagic stroke -very complex, difficult situation as anticoagulation contraindicated with recent hemorrhagic CVA but has acute DVT, now s/p IVC filter  For questions or updates, please contact [place] Please consult [email or web address] for contact info under   CRITICAL CARE Patient is critically ill with multiple organ systems affected and requires high complexity decision making. Total critical care time: 30 minutes. This time includes gathering of history, evaluation of patient's response to treatment, examination of patient, review of laboratory and imaging studies, and coordination with consultants.       Signed, Buford Dresser, MD  12/05/2018, 12:39 PM

## 2018-12-05 NOTE — Progress Notes (Signed)
eLink Physician-Brief Progress Note Patient Name: Glenn [name] DOB: 1950/10/26 MRN: [id number]   Date of Service  12/05/2018  HPI/Events of Note  Tmax 103 despite prn Tylenol. RVR with heart rate in 140's.  eICU Interventions  Cooling blanket ordered, continue Amiodarone infusion.        Glenn [name] 12/05/2018, 1:23 AM

## 2018-12-05 NOTE — Progress Notes (Signed)
NAME:  Glenn [name], MRN:  [id number], DOB:  [birth year], LOS: 3 ADMISSION DATE:  12/02/2018, CONSULTATION DATE: 12/02/2018 REFERRING MD: Dr. Blaine Hamper, CHIEF COMPLAINT: Respiratory distress  Brief History   68 year old gentleman transferred from [facility] with fever, A. fib with RVR, abdominal pain, respiratory distress Consult for medical management Recent CVA  Extensive DVT-post IVC filter placement  History of present illness   68 year old male with past medical history significant for hypertension, hyperlipidemia, A. Fib previously on eliquis, HFrEF with EF 15-20% s/p ICD and cardioversion 10/29/2018, Hep C, CKD stage III, who most recently being admitted on 9/21 with left hemiparessis found to have large right MCA subacute hemorrhage. Additionally found to have acute nonischemic cardiomyopathy with EF 15-20% s/p ICD placement and cardioversion 9/9 for Afib.  Discharge 10/13  On empiric antibiotics-Zosyn   On nasal cannula at present  Past Medical History   Past Medical History:  Diagnosis Date  . Atrial fibrillation ([place])   . CHF (congestive heart failure) ([place])   . Hepatitis C   . Hypertension   . Stroke ([place])   . Visit for monitoring Tikosyn therapy 03/26/2017   Significant Hospital Events   Acute respiratory failure 12/02/2018-initiated rapid response and transferred to the ICU  Consults:  PCCM on 12/02/2018  Procedures:    Significant Diagnostic Tests:  12/02/2018 CTH a/p >> 1. No evidence of retroperitoneal hematoma. 2. Small right pleural effusion layering dependently. Mild atelectasis or infiltrate at the right lung base. 3. Aortic atherosclerosis. 4. Gallstones in the gallbladder but no CT evidence of cholecystitis. 5. No intraperitoneal abscess. No acute bowel pathology evident. Aortic Atherosclerosis   Micro Data:  10/13 blood culture x2-no growth  10/13 urine culture>> 10/16 blood cultures x2>>  Antimicrobials:  10/8 ceftriaxone completed  10/13  cefepime 10/13 zosyn >> 10/13 vanc   Interim history/subjective:  On nasal cannula Overall mental status appears to be better, denies pain or discomfort T-max 103  Objective   Blood pressure (!) 105/52, pulse (!) 33, temperature 98.7 F (37.1 C), temperature source Oral, resp. rate (!) 30, weight 84.9 kg, SpO2 98 %.    FiO2 (%):  [30 %] 30 %   Intake/Output Summary (Last 24 hours) at 12/05/2018 0915 Last data filed at 12/05/2018 0900 Gross per 24 hour  Intake 875.88 ml  Output 1095 ml  Net -219.12 ml   Examination: General: Acutely ill-appearing, comfortable . HENT: Moist oral mucosa, pupils reactive. Lungs: Decreased air movement bilaterally. Cardiac: S1-S2 appreciated Abdomen: Soft, nontender, bowel sounds appreciated  extremities: Edema Neuro: Alert and oriented, decreased strength on the left side GU: [facility] Problem list     Assessment & Plan:   Marland Kitchen  Acute respiratory failure -Continue empiric antibiotics -Continue bronchodilator treatments -BiPAP as needed  .  Extensive DVT .  High probability for pulmonary embolism -Creatinine is borderline -Status post IVC filter placement  .  SIRS .  Fever -Leukocytosis -Continue antibiotics -Follow cultures  .  Atrial fibrillation with rapid ventricular response -Appreciate cardiology assistance -On amiodarone -Not on anticoagulation candidate secondary to recent hemorrhagic stroke  .  History of MCA hemorrhagic stroke with residual facial droop, left-sided hemiparesis and dysphagia - will have cortrac placed today   .  History of hepatitis C -Outpatient follow-up  Monitor electrolytes Best practice:  Diet: For cortrac today and tube feeding Pain/Anxiety/Delirium protocol (if indicated): Morphine as needed VAP protocol (if indicated): Not applicable DVT prophylaxis: DVT lower extremity, cannot be anticoagulated secondary to recent  hemorrhagic CVA GI prophylaxis: Not applicable  Glucose control: Monitor glucose Mobility: Bedrest Code Status: Full code Family Communication: Will update Disposition: ICU  Labs   CBC: Recent Labs  Lab 12/01/18 0726 12/02/18 1528 12/02/18 2018 12/02/18 2202 12/04/18 0333  WBC 4.7 11.3* 15.6*  --  17.3*  NEUTROABS 2.4  --  12.3*  --   --   HGB 11.0* 12.6* 12.3* 10.9* 11.2*  HCT 33.1* 38.0* 36.1* 32.0* 33.5*  MCV 95.7 95.7 94.5  --  96.0  PLT 220 276 278  --  [id number]    Basic Metabolic Panel: Recent Labs  Lab 12/01/18 0726 12/02/18 1528 12/02/18 2202 12/02/18 2209 12/03/18 0225 12/03/18 0856 12/04/18 0333  NA 139 137 137 136  --  138 138  K 4.3 4.9 5.0 6.1*  --  5.2* 4.5  CL 104 102  --  103  --  105 103  CO2 25 23  --  22  --  24 23  GLUCOSE 115* 149*  --  136*  --  139* 138*  BUN 11 15  --  17  --  17 16  CREATININE 1.07 1.39*  --  1.66* 1.55* 1.59* 1.49*  CALCIUM 9.0 9.2  --  8.5*  --  8.6* 8.8*  MG  --   --   --   --  1.8  --   --    GFR: Estimated Creatinine Clearance: 50.8 mL/min (A) (by C-G formula based on SCr of 1.49 mg/dL (H)). Recent Labs  Lab 12/01/18 0726 12/02/18 1528 12/02/18 2008 12/02/18 2018 12/02/18 2209 12/02/18 2220 12/04/18 0333  PROCALCITON  --   --   --   --  0.17  --   --   WBC 4.7 11.3*  --  15.6*  --   --  17.3*  LATICACIDVEN  --   --  1.2  --   --  1.5  --     ABG    Component Value Date/Time   PHART 7.402 12/02/2018 2202   PCO2ART 38.3 12/02/2018 2202   PO2ART 447.0 (H) 12/02/2018 2202   HCO3 23.8 12/02/2018 2202   TCO2 25 12/02/2018 2202   ACIDBASEDEF 1.0 12/02/2018 2202   O2SAT 100.0 12/02/2018 2202     Past Medical History  He,  has a past medical history of Atrial fibrillation ([place]), CHF (congestive heart failure) ([place]), Hepatitis C, Hypertension, Stroke ([place]), and Visit for monitoring Tikosyn therapy (03/26/2017).   The patient is critically ill with multiple organ systems failure and requires high complexity decision making for assessment and support, frequent  evaluation and titration of therapies, application of advanced monitoring technologies and extensive interpretation of multiple databases. Critical Care Time devoted to patient care services described in this note independent of APP/resident time (if applicable)  is 35 minutes.   Sherrilyn Rist MD [facility] Personal pager: [phone] If unanswered, please page CCM On-call: [phone]

## 2018-12-05 NOTE — Progress Notes (Signed)
Patient on neurontin at home for pain  Will start neurontin 300 per tube today Increase dose tomorrow to BID and advance from there

## 2018-12-05 NOTE — Progress Notes (Signed)
LUE venous duplex       has been completed. Preliminary results can be found under CV proc through chart review. Shi Grose, BS, RDMS, RVT   

## 2018-12-05 NOTE — Progress Notes (Signed)
Inpatient Rehabilitation Admissions Coordinator  Discussed with Ovidio Kin, SW for CIR. Patient will need SNF rehab once medically ready for d/c. No plans to readmit to CIR from acute hospital. I have alerted SW, Lorriane Shire, of SNF need.  Danne Baxter, RN, MSN Rehab Admissions Coordinator [phone] 12/05/2018 9:26 AM

## 2018-12-05 NOTE — Progress Notes (Signed)
eLink Physician-Brief Progress Note Patient Name: Glenn [name] DOB: 1950-06-24 MRN: [id number]   Date of Service  12/05/2018  HPI/Events of Note  AFIB with RVR - HR = 159. Patient is already on Amiodarone, however, he has a history of prolonged QT interval. Therefore, reluctant to rebolus with Amiodarone. K+ = 3.9. BP = 174/125 (??)  eICU Interventions  Will order: 1. Metoprolol 5 mg IV now. 2. Replace K+. 3. Mg++ level STAT.      Intervention Category Major Interventions: Arrhythmia - evaluation and management  Sommer,Steven Eugene 12/05/2018, 11:30 PM

## 2018-12-06 ENCOUNTER — Inpatient Hospital Stay (HOSPITAL_COMMUNITY): Payer: Medicare HMO

## 2018-12-06 DIAGNOSIS — A419 Sepsis, unspecified organism: Secondary | ICD-10-CM

## 2018-12-06 DIAGNOSIS — I5021 Acute systolic (congestive) heart failure: Secondary | ICD-10-CM

## 2018-12-06 DIAGNOSIS — I5042 Chronic combined systolic (congestive) and diastolic (congestive) heart failure: Secondary | ICD-10-CM

## 2018-12-06 LAB — MAGNESIUM: Magnesium: 2.4 mg/dL (ref 1.7–2.4)

## 2018-12-06 LAB — BASIC METABOLIC PANEL
Anion gap: 12 (ref 5–15)
BUN: 26 mg/dL — ABNORMAL HIGH (ref 8–23)
CO2: 23 mmol/L (ref 22–32)
Calcium: 8.6 mg/dL — ABNORMAL LOW (ref 8.9–10.3)
Chloride: 104 mmol/L (ref 98–111)
Creatinine, Ser: 1.29 mg/dL — ABNORMAL HIGH (ref 0.61–1.24)
GFR calc Af Amer: >60 mL/min (ref >60)
GFR calc non Af Amer: 57 mL/min — ABNORMAL LOW (ref >60)
Glucose, Bld: 109 mg/dL — ABNORMAL HIGH (ref 70–99)
Potassium: 4.1 mmol/L (ref 3.5–5.1)
Sodium: 139 mmol/L (ref 135–145)

## 2018-12-06 MED ORDER — DIGOXIN 0.25 MG/ML IJ SOLN
0.2500 mg | Freq: Once | INTRAMUSCULAR | Status: AC
  Administered 2018-12-06: 13:00:00 0.25 mg via INTRAVENOUS
  Filled 2018-12-06: qty 2

## 2018-12-06 MED ORDER — DIGOXIN 0.25 MG/ML IJ SOLN
0.2500 mg | Freq: Once | INTRAMUSCULAR | Status: AC
  Administered 2018-12-06: 19:00:00 0.25 mg via INTRAVENOUS
  Filled 2018-12-06: qty 2

## 2018-12-06 MED ORDER — METOPROLOL TARTRATE 5 MG/5ML IV SOLN
INTRAVENOUS | Status: AC
  Filled 2018-12-06: qty 5

## 2018-12-06 MED ORDER — METOPROLOL TARTRATE 5 MG/5ML IV SOLN
5.0000 mg | Freq: Once | INTRAVENOUS | Status: AC
  Administered 2018-12-06: 5 mg via INTRAVENOUS

## 2018-12-06 MED ORDER — FUROSEMIDE 10 MG/ML IJ SOLN
40.0000 mg | Freq: Once | INTRAMUSCULAR | Status: AC
  Administered 2018-12-06: 40 mg via INTRAVENOUS
  Filled 2018-12-06: qty 4

## 2018-12-06 MED ORDER — VANCOMYCIN HCL 10 G IV SOLR
1500.0000 mg | INTRAVENOUS | Status: DC
  Filled 2018-12-06: qty 1500

## 2018-12-06 MED ORDER — AMIODARONE IV BOLUS ONLY 150 MG/100ML: 150.0000 mg | Freq: Once | INTRAVENOUS | Status: DC

## 2018-12-06 MED ORDER — VANCOMYCIN HCL 10 G IV SOLR
1500.0000 mg | Freq: Once | INTRAVENOUS | Status: AC
  Administered 2018-12-06: 04:00:00 1500 mg via INTRAVENOUS
  Filled 2018-12-06 (×2): qty 1500

## 2018-12-06 MED ORDER — AMIODARONE LOAD VIA INFUSION
150.0000 mg | Freq: Once | INTRAVENOUS | Status: AC
  Administered 2018-12-06: 150 mg via INTRAVENOUS

## 2018-12-06 NOTE — Progress Notes (Signed)
Pharmacy Antibiotic Note  Glenn [name] is a 68 y.o. male transferred from CIR back to [facility] on 10/13 with concern for sepsis. Pharmacy consulted for antibiotic dosing.  The patient remains on Zosyn D#5 and with persistent fevers/leukocytosis. 1 of 4 BCx are growing GPC and the plan is to add Vancomycin while awaiting further speciation to rule out contamination.   Plan: - Vancomycin 1500 mg IV x 1 (already given this AM) - Continue Vancomycin 1500 mg IV every 24 hours - Will continue to follow renal function, culture results, LOT, and antibiotic de-escalation plans   Weight: 190 lb 4.1 oz (86.3 kg)  Temp (24hrs), Avg:100 F (37.8 C), Min:98.6 F (37 C), Max:101.9 F (38.8 C)  Recent Labs  Lab 12/01/18 0726 12/02/18 1528 12/02/18 2008 12/02/18 2018  12/02/18 2220 12/03/18 0225 12/03/18 0856 12/04/18 0333 12/05/18 1029 12/06/18 0647  WBC 4.7 11.3*  --  15.6*  --   --   --   --  17.3* 15.9*  --   CREATININE 1.07 1.39*  --   --    < >  --  1.55* 1.59* 1.49* 1.33* 1.29*  LATICACIDVEN  --   --  1.2  --   --  1.5  --   --   --   --   --    < > = values in this interval not displayed.    Estimated Creatinine Clearance: 59.1 mL/min (A) (by C-G formula based on SCr of 1.29 mg/dL (H)).    Allergies  Allergen Reactions  . Benadryl [Diphenhydramine] Palpitations    Antimicrobials this admission: Cefepime 10/13 x 1 Vancomycin 10/13 x 1; restart 10/17 >> Zosyn 10/13 >>  Dose adjustments this admission: n/a  Microbiology results: 10/13 BCx >> 1/2 GPC 10/13 MRSA PCR >> neg  Thank you for allowing pharmacy to be a part of this patient's care.  Alycia Rossetti, PharmD, BCPS Clinical Pharmacist Clinical phone for 12/06/2018: [phone] 12/06/2018 12:02 PM   **Pharmacist phone directory can now be found on [email or web address] ([email or web address]).  Listed under [place].

## 2018-12-06 NOTE — Progress Notes (Signed)
eLink Physician-Brief Progress Note Patient Name: Glenn [name] DOB: [birth year] MRN: [id number]   Date of Service  12/06/2018  HPI/Events of Note  AFIB with RVR - HR = 127-137. Currently on Amiodarone with prolonged QT interval.   eICU Interventions  Will order: 1. Metoprolol 5 mg IV x 1 now.      Intervention Category Major Interventions: Arrhythmia - evaluation and management  Anju Sereno Eugene 12/06/2018, 6:43 AM

## 2018-12-06 NOTE — Progress Notes (Addendum)
NAME:  Glenn [name], MRN:  [id number], DOB:  [birth year], LOS: 4 ADMISSION DATE:  12/02/2018, CONSULTATION DATE: 12/02/2018 REFERRING MD: Dr. Blaine Hamper, CHIEF COMPLAINT: Respiratory distress  Brief History   68 year old gentleman transferred from [facility] with fever, A. fib with RVR, abdominal pain, respiratory distress Consult for medical management Recent CVA  Extensive DVT-post IVC filter placement  History of present illness   68 year old male with past medical history significant for hypertension, hyperlipidemia, A. Fib previously on eliquis, HFrEF with EF 15-20% s/p ICD and cardioversion 10/29/2018, Hep C, CKD stage III, who most recently being admitted on 9/21 with left hemiparessis found to have large right MCA subacute hemorrhage. Additionally found to have acute nonischemic cardiomyopathy with EF 15-20% s/p ICD placement and cardioversion 9/9 for Afib.  Discharge 10/13  On empiric antibiotics-Zosyn   On nasal cannula at present  Past Medical History   Past Medical History:  Diagnosis Date  . Atrial fibrillation ([place])   . CHF (congestive heart failure) ([place])   . Hepatitis C   . Hypertension   . Stroke ([place])   . Visit for monitoring Tikosyn therapy 03/26/2017   Significant Hospital Events   Acute respiratory failure 12/02/2018-initiated rapid response and transferred to the ICU  Consults:  PCCM on 12/02/2018  Procedures:    Significant Diagnostic Tests:  12/02/2018 CTH a/p >> 1. No evidence of retroperitoneal hematoma. 2. Small right pleural effusion layering dependently. Mild atelectasis or infiltrate at the right lung base. 3. Aortic atherosclerosis. 4. Gallstones in the gallbladder but no CT evidence of cholecystitis. 5. No intraperitoneal abscess. No acute bowel pathology evident. Aortic Atherosclerosis   Micro Data:  10/13 blood culture x2-no growth  10/13 urine culture>> 10/16 blood cultures x2>> 1 of 2 with GPC  Antimicrobials:  10/8 ceftriaxone  completed  10/13 cefepime 10/13 zosyn >> 10/13 vanc   Interim history/subjective:  Tmax 101.9. On BiPAP overnight. Tolerating nasal cannula yesterday. Required metoprolol x 2 overnight for AFRVR. Coughing blood-tinged sputum this morning.  Objective   Blood pressure 103/74, pulse (!) 119, temperature 99.6 F (37.6 C), temperature source Axillary, resp. rate (!) 25, weight 86.3 kg, SpO2 100 %.    FiO2 (%):  [30 %] 30 %   Intake/Output Summary (Last 24 hours) at 12/06/2018 0807 Last data filed at 12/06/2018 0700 Gross per 24 hour  Intake 2293.64 ml  Output 200 ml  Net 2093.64 ml   Physical Exam: General: Chronically ill-appearing, no acute distress HENT: [place], AT, OP clear, MMM Eyes: EOMI, no scleral icterus Respiratory: Coarse breath sounds bilaterally. No wheezing. Cardiovascular: Irregular rate and rhythm, -M/R/G, no JVD GI: BS+, soft, nontender Extremities:-Edema,-tenderness Neuro: AAO x4, left hemiparesis Skin: Intact, no rashes or bruising Psych: Normal mood, normal affect GU: Condom cath in place  Resolved Hospital Problem list     Assessment & Plan:   .  Atrial fibrillation with rapid ventricular response - uncontrolled. Received BB x 2 overnight -Appreciate cardiology assistance -On amiodarone -Not on anticoagulation candidate secondary to recent hemorrhagic stroke -Diurese x 1  .  Acute respiratory failure - improving -Wean supplemental oxygen for goal SpO2 88-95%. BiPAP as needed -Continue empiric antibiotics. De-escalate pending culture data -Obtain sputum culture -CXR -Continue bronchodilator treatments  .  Extensive DVT .  High probability for pulmonary embolism -Creatinine is borderline -Status post IVC filter placement  .  Sepsis secondary to possible bacteremia - remains febrile -1 of 2 BCx on 10/16 with GPC. F/u final culture -Continue empiric antibiotics. De-escalate pending  culture data -Obtain sputum culture  .  History of MCA  hemorrhagic stroke with residual facial droop, left-sided hemiparesis and dysphagia -S/p cortrak -Swallow evaluation  .  History of hepatitis C -Outpatient follow-up  .  AKI -Monitor UOP/electrolytes   Best practice:  Diet: Tube feeding Pain/Anxiety/Delirium protocol (if indicated): Morphine as needed VAP protocol (if indicated): Not applicable DVT prophylaxis: DVT lower extremity, cannot be anticoagulated secondary to recent hemorrhagic CVA GI prophylaxis: Not applicable Glucose control: Monitor glucose Mobility: Bedrest Code Status: Full code Family Communication: Will update Disposition: ICU  Labs   CBC: Recent Labs  Lab 12/01/18 0726 12/02/18 1528 12/02/18 2018 12/02/18 2202 12/04/18 0333 12/05/18 1029  WBC 4.7 11.3* 15.6*  --  17.3* 15.9*  NEUTROABS 2.4  --  12.3*  --   --  13.0*  HGB 11.0* 12.6* 12.3* 10.9* 11.2* 11.3*  HCT 33.1* 38.0* 36.1* 32.0* 33.5* 33.9*  MCV 95.7 95.7 94.5  --  96.0 96.0  PLT 220 276 278  --  214 [id number]    Basic Metabolic Panel: Recent Labs  Lab 12/02/18 1528 12/02/18 2202 12/02/18 2209 12/03/18 0225 12/03/18 0856 12/04/18 0333 12/05/18 1029 12/05/18 2358  NA 137 137 136  --  138 138 137  --   K 4.9 5.0 6.1*  --  5.2* 4.5 3.9  --   CL 102  --  103  --  105 103 102  --   CO2 23  --  22  --  24 23 23   --   GLUCOSE 149*  --  136*  --  139* 138* 122*  --   BUN 15  --  17  --  17 16 19   --   CREATININE 1.39*  --  1.66* 1.55* 1.59* 1.49* 1.33*  --   CALCIUM 9.2  --  8.5*  --  8.6* 8.8* 8.4*  --   MG  --   --   --  1.8  --   --   --  2.4   GFR: Estimated Creatinine Clearance: 57.3 mL/min (A) (by C-G formula based on SCr of 1.33 mg/dL (H)). Recent Labs  Lab 12/02/18 1528 12/02/18 2008 12/02/18 2018 12/02/18 2209 12/02/18 2220 12/04/18 0333 12/05/18 1029  PROCALCITON  --   --   --  0.17  --   --   --   WBC 11.3*  --  15.6*  --   --  17.3* 15.9*  LATICACIDVEN  --  1.2  --   --  1.5  --   --     ABG    Component Value  Date/Time   PHART 7.402 12/02/2018 2202   PCO2ART 38.3 12/02/2018 2202   PO2ART 447.0 (H) 12/02/2018 2202   HCO3 23.8 12/02/2018 2202   TCO2 25 12/02/2018 2202   ACIDBASEDEF 1.0 12/02/2018 2202   O2SAT 100.0 12/02/2018 2202     Past Medical History  He,  has a past medical history of Atrial fibrillation ([place]), CHF (congestive heart failure) ([place]), Hepatitis C, Hypertension, Stroke ([place]), and Visit for monitoring Tikosyn therapy (03/26/2017).   The patient is critically ill and requires high complexity decision making for assessment and support, frequent evaluation and titration of therapies, application of advanced monitoring technologies and extensive interpretation of multiple databases.   Critical Care Time devoted to patient care services described in this note is 33 Minutes. This time reflects time of care of this signee Dr. Rodman Pickle. This critical care time does not reflect procedure time,  or teaching time or supervisory time of PA/NP/Med student/Med Resident etc but could involve care discussion time.  Rodman Pickle, M.D. [facility] Pulmonary/Critical Care Medicine 12/06/2018 8:08 AM  Pager: [phone] After hours pager: [phone]

## 2018-12-06 NOTE — Progress Notes (Addendum)
Cardiology Progress Note  Patient ID: Glenn [name] MRN: [id number] DOB: 1950/12/28 Date of Encounter: 12/06/2018  Primary Cardiologist: Virl Axe, MD  Subjective  Atrial fibrillation and RVR overnight into this morning.  Rates have been difficult to control despite boluses of amiodarone as well as IV metoprolol.  Plenty of blood pressure.  Critical care team has begun to diurese him as he is volume overloaded.  There are concerns for infection given persistent fever.  Due to lab issues blood cultures have been drawn and are being repeated.  ROS:  All other ROS reviewed and negative. Pertinent positives noted in the HPI.     Inpatient Medications  Scheduled Meds:  chlorhexidine  15 mL Mouth Rinse BID   Chlorhexidine Gluconate Cloth  6 each Topical Daily   digoxin  0.25 mg Intravenous Once   Followed by   digoxin  0.25 mg Intravenous Once   feeding supplement (PRO-STAT SUGAR FREE 64)  30 mL Per Tube Daily   gabapentin  300 mg Per Tube Q8H   ipratropium  0.5 mg Nebulization Q6H   levalbuterol  0.63 mg Nebulization Q6H   mouth rinse  15 mL Mouth Rinse q12n4p   Continuous Infusions:  amiodarone 30 mg/hr (12/06/18 1100)   feeding supplement (OSMOLITE 1.5 CAL) 1,000 mL (12/06/18 0604)   piperacillin-tazobactam (ZOSYN)  IV 3.375 g (12/06/18 1232)   [START ON 12/07/2018] vancomycin     PRN Meds: acetaminophen, bisacodyl, morphine injection   Vital Signs   Vitals:   12/06/18 1000 12/06/18 1100 12/06/18 1150 12/06/18 1200  BP: 127/75 (!) 128/95  127/78  Pulse: (!) 133   (!) 116  Resp: (!) 36 (!) 27  (!) 37  Temp:   98.8 F (37.1 C)   TempSrc:   Oral   SpO2: 96%   97%  Weight:        Intake/Output Summary (Last 24 hours) at 12/06/2018 1242 Last data filed at 12/06/2018 1100 Gross per 24 hour  Intake 2495.87 ml  Output 250 ml  Net 2245.87 ml   Last 3 Weights 12/06/2018 12/05/2018 12/02/2018  Weight (lbs) 190 lb 4.1 oz 187 lb 2.7 oz 190 lb 7.6 oz    Weight (kg) 86.3 kg 84.9 kg 86.4 kg      Telemetry  Overnight telemetry shows atrial fibrillation with heart rates in the 120-150 range., which I personally reviewed.   ECG  The most recent ECG shows atrial fibrillation with RVR, which I personally reviewed.   Physical Exam   Vitals:   12/06/18 1000 12/06/18 1100 12/06/18 1150 12/06/18 1200  BP: 127/75 (!) 128/95  127/78  Pulse: (!) 133   (!) 116  Resp: (!) 36 (!) 27  (!) 37  Temp:   98.8 F (37.1 C)   TempSrc:   Oral   SpO2: 96%   97%  Weight:         Intake/Output Summary (Last 24 hours) at 12/06/2018 1242 Last data filed at 12/06/2018 1100 Gross per 24 hour  Intake 2495.87 ml  Output 250 ml  Net 2245.87 ml    Last 3 Weights 12/06/2018 12/05/2018 12/02/2018  Weight (lbs) 190 lb 4.1 oz 187 lb 2.7 oz 190 lb 7.6 oz  Weight (kg) 86.3 kg 84.9 kg 86.4 kg    Body mass index is 29.8 kg/m.  General: Ill-appearing, awake, alert, will follow commands Head: Atraumatic, normal size  Eyes: PEERLA, EOMI  Neck: Supple, JVD noted 10 to 12 cm of water Endocrine: No thryomegaly Cardiac:  Irregular rhythm, 3/6 holosystolic murmur noted, tachycardia present Lungs: Crackles noted at lung bases Abd: Soft, nontender, no hepatomegaly  Ext: 1+ lower extremity edema Musculoskeletal: No deformities, BUE and BLE strength normal and equal Skin: Warm and dry, no rashes   Neuro: Alert, awake, will follow commands, does not answer questions  Labs  High Sensitivity Troponin:   Recent Labs  Lab 12/02/18 2209 12/03/18 0225  TROPONINIHS 26* 25*     Cardiac EnzymesNo results for input(s): TROPONINI in the last 168 hours. No results for input(s): TROPIPOC in the last 168 hours.  Chemistry Recent Labs  Lab 12/01/18 0726 12/02/18 1528  12/02/18 2209  12/04/18 0333 12/05/18 1029 12/06/18 0647  NA 139 137   < > 136   < > 138 137 139  K 4.3 4.9   < > 6.1*   < > 4.5 3.9 4.1  CL 104 102  --  103   < > 103 102 104  CO2 25 23  --  22   < > 23  23 23   GLUCOSE 115* 149*  --  136*   < > 138* 122* 109*  BUN 11 15  --  17   < > 16 19 26*  CREATININE 1.07 1.39*  --  1.66*   < > 1.49* 1.33* 1.29*  CALCIUM 9.0 9.2  --  8.5*   < > 8.8* 8.4* 8.6*  PROT 6.2* 7.4  --  6.5  --   --   --   --   ALBUMIN 2.6* 3.1*  --  2.7*  --   --   --   --   AST 24 25  --  26  --   --   --   --   ALT 14 16  --  16  --   --   --   --   ALKPHOS 55 68  --  61  --   --   --   --   BILITOT 0.5 0.8  --  0.8  --   --   --   --   GFRNONAA >60 52*  --  42*   < > 48* 55* 57*  GFRAA >60 >60  --  49*   < > 56* >60 >60  ANIONGAP 10 12  --  11   < > 12 12 12    < > = values in this interval not displayed.    Hematology Recent Labs  Lab 12/02/18 2018 12/02/18 2202 12/04/18 0333 12/05/18 1029  WBC 15.6*  --  17.3* 15.9*  RBC 3.82*  --  3.49* 3.53*  HGB 12.3* 10.9* 11.2* 11.3*  HCT 36.1* 32.0* 33.5* 33.9*  MCV 94.5  --  96.0 96.0  MCH 32.2  --  32.1 32.0  MCHC 34.1  --  33.4 33.3  RDW 12.5  --  12.5 12.5  PLT 278  --  214 213   BNP Recent Labs  Lab 12/02/18 2032 12/05/18 1029  BNP 566.2* 450.0*    DDimer No results for input(s): DDIMER in the last 168 hours.   Radiology  Ir Ivc Filter Plmt / S&i /img Guid/mod Sed  Result Date: 12/04/2018 INDICATION: Acute occlusive left lower extremity DVT in the setting of acute hemorrhagic right MCA infarct. Patient cannot be anticoagulated EXAM: ULTRASOUND GUIDANCE FOR VASCULAR ACCESS IVC CATHETERIZATION AND VENOGRAM IVC FILTER INSERTION Date:  12/04/2018 12/04/2018 3:44 pm Radiologist:  Jerilynn Mages. Daryll Brod, MD Guidance:  Ultrasound and fluoroscopic CONTRAST:  50  cc Omnipaque 300 MEDICATIONS: 1% lidocaine local ANESTHESIA/SEDATION: 0 mg IV Versed; 50 mcg IV Fentanyl Moderate Sedation Time: None. The patient was continuously monitored during the procedure by the interventional radiology nurse under my direct supervision. FLUOROSCOPY TIME:  Fluoroscopy Time: 1 minutes 18 seconds (103 mGy). COMPLICATIONS: None immediate.  PROCEDURE: Informed consent was obtained from the patient following explanation of the procedure, risks, benefits and alternatives. The patient understands, agrees and consents for the procedure. All questions were addressed. A time out was performed. Maximal barrier sterile technique utilized including caps, mask, sterile gowns, sterile gloves, large sterile drape, hand hygiene, and betadine prep. Under sterile condition and local anesthesia, right internal jugular venous access was performed with ultrasound. Over a guide wire, the IVC filter delivery sheath and inner dilator were advanced into the IVC just above the IVC bifurcation. Contrast injection was performed for an IVC venogram. IVC VENOGRAM: The IVC is patent. No evidence of thrombus, stenosis, or occlusion. No variant venous anatomy. The renal veins are identified at L1-2. IVC FILTER INSERTION: Through the delivery sheath, the Bard Denali IVC filter was deployed in the infrarenal IVC at the L2-3 level just below the renal veins and above the IVC bifurcation. Contrast injection confirmed position. There is good apposition of the filter against the IVC. The delivery sheath was removed and hemostasis was obtained with compression for 5 minutes. The patient tolerated the procedure well. No immediate complications. IMPRESSION: Ultrasound and fluoroscopically guided infrarenal IVC filter insertion. PLAN: This IVC filter is potentially retrievable. The patient will be assessed for filter retrieval by Interventional Radiology in approximately 8-12 weeks. Further recommendations regarding filter retrieval, continued surveillance or declaration of device permanence, will be made at that time. Electronically Signed   By: Jerilynn Mages.  Shick M.D.   On: 12/04/2018 15:53   Dg Chest Port 1 View  Result Date: 12/06/2018 CLINICAL DATA:  Hemoptysis. EXAM: PORTABLE CHEST 1 VIEW COMPARISON:  12/04/2018 FINDINGS: Soft feeding tube enters the stomach. Pacemaker/AICD remains in  place. Left chest remains clear. Persistent right effusion with atelectasis of the mid and right lung. Effusion is probably larger than on the previous study. IMPRESSION: Persistent right effusion and atelectasis. The effusion appears larger than on the previous study. No other significant change. Electronically Signed   By: Nelson Chimes M.D.   On: 12/06/2018 10:14   Dg Abd Portable 1v  Result Date: 12/05/2018 CLINICAL DATA:  Encounter for feeding tube placement EXAM: PORTABLE ABDOMEN - 1 VIEW COMPARISON:  Yesterday FINDINGS: Feeding tube with tip over the proximal stomach. IVC filter in place. Normal bowel gas pattern. IMPRESSION: Feeding tube with tip over the proximal stomach. Electronically Signed   By: Monte Fantasia M.D.   On: 12/05/2018 11:31   Vas [place] Upper Extremity Venous Duplex  Result Date: 12/06/2018 UPPER VENOUS STUDY  Indications: Swelling Risk Factors: Current LE DVT with IVC filter. Performing Technologist: June Leap RDMS, RVT  Examination Guidelines: A complete evaluation includes B-mode imaging, spectral Doppler, color Doppler, and power Doppler as needed of all accessible portions of each vessel. Bilateral testing is considered an integral part of a complete examination. Limited examinations for reoccurring indications may be performed as noted.  Right Findings: +-----+------------+---------+-----------+----------+--------------------------+  RIGHT Compressible Phasicity Spontaneous Properties          Summary            +-----+------------+---------+-----------+----------+--------------------------+  IJV  unable to image due to                                                                positioning          +-----+------------+---------+-----------+----------+--------------------------+  Left Findings: +----------+------------+---------+-----------+----------+-------+  LEFT        Compressible Phasicity Spontaneous Properties Summary  +----------+------------+---------+-----------+----------+-------+  IJV            Full        Yes        Yes                         +----------+------------+---------+-----------+----------+-------+  Subclavian     Full        Yes        Yes                         +----------+------------+---------+-----------+----------+-------+  Axillary       Full        Yes        Yes                         +----------+------------+---------+-----------+----------+-------+  Brachial       Full        Yes        Yes                         +----------+------------+---------+-----------+----------+-------+  Radial         Full                                               +----------+------------+---------+-----------+----------+-------+  Ulnar          Full                                               +----------+------------+---------+-----------+----------+-------+  Cephalic       None                                      forearm  +----------+------------+---------+-----------+----------+-------+  Basilic        Full                                               +----------+------------+---------+-----------+----------+-------+  Summary:  Left: No evidence of deep vein thrombosis in the upper extremity. Findings consistent with acute superficial vein thrombosis involving the left cephalic vein.  *See table(s) above for measurements and observations.  Diagnosing physician: Harold Barban MD Electronically signed by Harold Barban MD on 12/06/2018 at 12:44:35 AM.    Final    Patient Profile  Glenn [name] is a 68 y.o. male with nonischemic cardiomyopathy, ejection fraction 15  to 20% status post ICD placement, atrial fibrillation status post multiple attempts at restoration of sinus rhythm without luck, recent hemorrhagic stroke, not on anticoagulation, left lower extremity DVT, status post IVC filter placement who is currently in the medical ICU with what appears  to be persistent fever and respiratory failure secondary to possible pneumonia.  Cardiology has been asked to assist with management of atrial fibrillation with RVR.  Assessment & Plan  1.  Atrial fibrillation with RVR -He is warm on exam and has been persistently febrile for 3 days.  There is concerns for possible bacteremia and blood cultures are in the lab.  He does appear warm and wet on examination, and this could also drive his atrial fibrillation.  His A. fib is likely secondary to either decompensated heart failure or sepsis. -I do agree with diuresis 40 mg IV Lasix, and please repeat this is able -Given his low ejection fraction, and limited ability for amiodarone here, I think we should proceed with digoxin loading (0.25 mg x 2 doses today, 6 hours apart) and we will check a digoxin level in the morning.  His level tomorrow will decide his dose. -More importantly this gentleman is febrile and has an ICD in place, I think it is prudent we obtain an echocardiogram to see if there is any evidence of endocarditis.  He is in no mental state for a transesophageal study, we will start with the study. -Given recent hemorrhagic stroke, we are unable to start anticoagulation.  Please reach out to neurosurgery as they have mentioned a repeat head CT could allow [place] to restart anticoagulation.  If we are able to, we will.  We will await input from neurosurgery. -Please do not use IV diltiazem as this will have a negative inotropic effect that could be detrimental to this gentleman. -If rates become an issue IV metoprolol as needed should be pursued.  Please do this cautiously.  2.  Fever with cardiac hardware -Continue to follow blood cultures and speciate as able -We will get a repeat echocardiogram -His fever could come from aspiration pneumonia, or persistent DVT in his leg, but I am concerned for possible infection.  3.  Nonischemic systolic heart failure, ejection fraction 15 to 20%, recent left  heart catheterization with normal coronary arteries -He is a bit volume up on my examination we will continue with IV diuretic therapy as done by the critical care medicine team -Please hold all ACE inhibitors/ARB's in this critically ill man -His low ejection fraction makes him rather tenuous, and we will therefore pursue digoxin for rate control.  We will hold his beta-blockers for now    CRITICAL CARE Performed by: Lake Bells T O'Neal  Total critical care time: 45 minutes. Critical care time was exclusive of separately billable procedures and treating other patients. Critical care was necessary to treat or prevent imminent or life-threatening deterioration. Critical care was time spent personally by me on the following activities: development of treatment plan with patient and/or surrogate as well as nursing, discussions with consultants, evaluation of patient's response to treatment, examination of patient, obtaining history from patient or surrogate, ordering and performing treatments and interventions, ordering and review of laboratory studies, ordering and review of radiographic studies, pulse oximetry and re-evaluation of patient's condition.  For questions or updates, please contact [place] Please consult [email or web address] for contact info under        Signed, Lake Bells T. Audie Box, [place]  12/06/2018 12:42 PM

## 2018-12-06 NOTE — Progress Notes (Signed)
  Speech Language Pathology Treatment: Dysphagia  Patient Details Name: Glenn [name] MRN: [id number] DOB: January 30, 1951 Today's Date: 12/06/2018 Time: [id number] SLP Time Calculation (min) (ACUTE ONLY): 10 min  Assessment / Plan / Recommendation Clinical Impression  Pts cognition and respiratory function has stabilized in comparison with prior note. He appears closer to post CVA baseline. Vocal quality clear, able to self feed sips of water via straw. At times he is a little impulsive, drinking prolonged consecutive sips. Even after several seconds of drinking without stopping, he did not cough, but did have increased RR with audible exhalations, like a grunt or throat clear. He stated this was due to pain in his lower abdomen. Further sips with more controlled rate yielded no sign of difficulty. Pt able to consume puree without difficulty. Given that prior goal addressed mastication and he was consuming puree on rehab, did not trials solids today. Pt may initiate thin liquids and puree, but keep Cortrak through the weekend. Will f/u for tolerance and diet progression.   HPI HPI: 68 year old male with past medical history significant for hypertension, hyperlipidemia, A. Fib previously on eliquis, HFrEF with EF 15-20% s/p ICD and cardioversion 10/29/2018, Hep C, CKD stage III, who admitted on 9/21 with left hemiparessis found to have large right MCA subacute hemorrhage. Additionally found to have acute nonischemic cardiomyopathy with EF 15-20% s/p ICD placement and cardioversion 9/9 for Afib. Pt with CIR admission from 9/25 to 10/13. Discharge back to acute with fever, A. fib with RVR, abdominal pain, respiratory distress with diagnosis of DVT. Pt awaiting IVC. Chest x-ray (12/04/18) concerning for increased opacity at the RIGHT lung base consistent with pleural effusion and atelectasis or infiltrate. Pt currently NPO d/t concerns of aspiration in setting of continued cognitive deficits and respiratory  distress.       SLP Plan  Continue with current plan of care       Recommendations  Diet recommendations: Thin liquid;Dysphagia 1 (puree) Liquids provided via: Straw;Cup Medication Administration: Via alternative means Supervision: Full supervision/cueing for compensatory strategies Compensations: Minimize environmental distractions;Slow rate Postural Changes and/or Swallow Maneuvers: Seated upright 90 degrees                Oral Care Recommendations: Oral care BID Plan: Continue with current plan of care       GO               Glenn [place], MA [place] Pager [phone] Office [phone]  Glenn [name] 12/06/2018, 9:45 AM

## 2018-12-06 NOTE — Progress Notes (Signed)
Echo attempted twice.  HR too high 3:00pm

## 2018-12-06 NOTE — Progress Notes (Signed)
PHARMACY - PHYSICIAN COMMUNICATION CRITICAL VALUE ALERT - BLOOD CULTURE IDENTIFICATION (BCID)  Glenn [name] is an 68 y.o. male who presented to [facility] on 12/02/2018 with a chief complaint of right MCA infarction with hemorrhagic conversion.  Assessment:  68yo male transferred back from CIR w/ sepsis and Afib, now w/ positive blood cx growing GPC in 1/2 bottles/sets; BCID unavailable currently.  Pt w/ persistent fevers and leukocytosis.  Name of physician (or Provider) Contacted: Dr Oletta Darter  Current antibiotics: Zosyn  Changes to prescribed antibiotics recommended:  Will give one dose of vancomycin and reassess.   Wynona Neat, PharmD, BCPS  12/06/2018  3:24 AM

## 2018-12-07 ENCOUNTER — Inpatient Hospital Stay (HOSPITAL_COMMUNITY): Payer: Medicare HMO

## 2018-12-07 DIAGNOSIS — I361 Nonrheumatic tricuspid (valve) insufficiency: Secondary | ICD-10-CM

## 2018-12-07 DIAGNOSIS — Z8673 Personal history of transient ischemic attack (TIA), and cerebral infarction without residual deficits: Secondary | ICD-10-CM

## 2018-12-07 DIAGNOSIS — I340 Nonrheumatic mitral (valve) insufficiency: Secondary | ICD-10-CM

## 2018-12-07 LAB — CULTURE, BLOOD (ROUTINE X 2)
Culture: NO GROWTH
Culture: NO GROWTH
Special Requests: ADEQUATE
Special Requests: ADEQUATE

## 2018-12-07 LAB — BASIC METABOLIC PANEL
Anion gap: 12 (ref 5–15)
BUN: 25 mg/dL — ABNORMAL HIGH (ref 8–23)
CO2: 24 mmol/L (ref 22–32)
Calcium: 8.3 mg/dL — ABNORMAL LOW (ref 8.9–10.3)
Chloride: 103 mmol/L (ref 98–111)
Creatinine, Ser: 1.12 mg/dL (ref 0.61–1.24)
GFR calc Af Amer: >60 mL/min (ref >60)
GFR calc non Af Amer: >60 mL/min (ref >60)
Glucose, Bld: 140 mg/dL — ABNORMAL HIGH (ref 70–99)
Potassium: 4.4 mmol/L (ref 3.5–5.1)
Sodium: 139 mmol/L (ref 135–145)

## 2018-12-07 LAB — ECHOCARDIOGRAM COMPLETE: Weight: 3008.84 oz

## 2018-12-07 LAB — GLUCOSE, CAPILLARY
Glucose-Capillary: 104 mg/dL — ABNORMAL HIGH (ref 70–99)
Glucose-Capillary: 112 mg/dL — ABNORMAL HIGH (ref 70–99)
Glucose-Capillary: 121 mg/dL — ABNORMAL HIGH (ref 70–99)
Glucose-Capillary: 130 mg/dL — ABNORMAL HIGH (ref 70–99)

## 2018-12-07 LAB — MAGNESIUM: Magnesium: 2.1 mg/dL (ref 1.7–2.4)

## 2018-12-07 LAB — DIGOXIN LEVEL: Digoxin Level: 0.3 ng/mL — ABNORMAL LOW (ref 0.8–2.0)

## 2018-12-07 LAB — EXPECTORATED SPUTUM ASSESSMENT W GRAM STAIN, RFLX TO RESP C

## 2018-12-07 MED ORDER — GABAPENTIN 250 MG/5ML PO SOLN
300.0000 mg | Freq: Three times a day (TID) | ORAL | Status: DC
  Administered 2018-12-07 – 2018-12-14 (×20): 300 mg via ORAL
  Filled 2018-12-07 (×25): qty 6

## 2018-12-07 MED ORDER — METOPROLOL TARTRATE 50 MG PO TABS
50.0000 mg | ORAL_TABLET | Freq: Four times a day (QID) | ORAL | Status: DC
  Administered 2018-12-07 – 2018-12-09 (×6): 50 mg via ORAL
  Filled 2018-12-07 (×7): qty 1

## 2018-12-07 MED ORDER — METOPROLOL TARTRATE 5 MG/5ML IV SOLN
INTRAVENOUS | Status: AC
  Filled 2018-12-07: qty 5

## 2018-12-07 MED ORDER — METOPROLOL TARTRATE 25 MG PO TABS
25.0000 mg | ORAL_TABLET | Freq: Four times a day (QID) | ORAL | Status: DC
  Administered 2018-12-07 (×2): 25 mg via ORAL
  Filled 2018-12-07 (×2): qty 1

## 2018-12-07 MED ORDER — CALCIUM CARBONATE ANTACID 500 MG PO CHEW
1.0000 | CHEWABLE_TABLET | Freq: Every day | ORAL | Status: DC | PRN
  Administered 2018-12-07 – 2018-12-20 (×2): 200 mg via ORAL
  Filled 2018-12-07 (×2): qty 1

## 2018-12-07 MED ORDER — METOPROLOL TARTRATE 5 MG/5ML IV SOLN
5.0000 mg | Freq: Once | INTRAVENOUS | Status: AC
  Administered 2018-12-07: 5 mg via INTRAVENOUS
  Filled 2018-12-07: qty 5

## 2018-12-07 MED ORDER — FUROSEMIDE 10 MG/ML IJ SOLN
40.0000 mg | Freq: Once | INTRAMUSCULAR | Status: AC
  Administered 2018-12-07: 40 mg via INTRAVENOUS
  Filled 2018-12-07: qty 4

## 2018-12-07 MED ORDER — HEPARIN (PORCINE) 25000 UT/250ML-% IV SOLN
2250.0000 [IU]/h | INTRAVENOUS | Status: AC
  Administered 2018-12-07: 1050 [IU]/h via INTRAVENOUS
  Administered 2018-12-08: 17:00:00 1400 [IU]/h via INTRAVENOUS
  Administered 2018-12-09: 09:00:00 1850 [IU]/h via INTRAVENOUS
  Administered 2018-12-11: 21:00:00 2100 [IU]/h via INTRAVENOUS
  Administered 2018-12-11: 01:00:00 2000 [IU]/h via INTRAVENOUS
  Administered 2018-12-11: 2250 [IU]/h via INTRAVENOUS
  Administered 2018-12-12 (×2): 2200 [IU]/h via INTRAVENOUS
  Administered 2018-12-13: 2300 [IU]/h via INTRAVENOUS
  Administered 2018-12-14 – 2018-12-16 (×6): 2400 [IU]/h via INTRAVENOUS
  Administered 2018-12-16: 2300 [IU]/h via INTRAVENOUS
  Administered 2018-12-18 – 2018-12-19 (×2): 2350 [IU]/h via INTRAVENOUS
  Filled 2018-12-07 (×25): qty 250

## 2018-12-07 MED ORDER — FUROSEMIDE 10 MG/ML IJ SOLN
40.0000 mg | Freq: Once | INTRAMUSCULAR | Status: AC
  Administered 2018-12-07 (×2): 40 mg via INTRAVENOUS
  Filled 2018-12-07: qty 4

## 2018-12-07 MED ORDER — METOPROLOL TARTRATE 5 MG/5ML IV SOLN
5.0000 mg | Freq: Once | INTRAVENOUS | Status: AC
  Administered 2018-12-07: 17:00:00 5 mg via INTRAVENOUS

## 2018-12-07 MED ORDER — METOPROLOL TARTRATE 25 MG PO TABS
25.0000 mg | ORAL_TABLET | Freq: Once | ORAL | Status: AC
  Administered 2018-12-07: 21:00:00 25 mg via ORAL
  Filled 2018-12-07: qty 1

## 2018-12-07 MED ORDER — HYDRALAZINE HCL 25 MG PO TABS
25.0000 mg | ORAL_TABLET | Freq: Four times a day (QID) | ORAL | Status: DC | PRN
  Administered 2018-12-07: 25 mg via ORAL
  Filled 2018-12-07: qty 1

## 2018-12-07 MED ORDER — FUROSEMIDE 10 MG/ML IJ SOLN: 40.0000 mg | Freq: Two times a day (BID) | INTRAMUSCULAR | Status: DC

## 2018-12-07 NOTE — Progress Notes (Signed)
ANTICOAGULATION CONSULT NOTE - Initial Consult  Pharmacy Consult for heparin Indication: atrial fibrillation  Allergies  Allergen Reactions  . Benadryl [Diphenhydramine] Palpitations    Patient Measurements: Weight: 188 lb 0.8 oz (85.3 kg) Heparin Dosing Weight: 85kg  Vital Signs: Temp: 97.6 F (36.4 C) (10/18 0752) Temp Source: Oral (10/18 0752) BP: 179/84 (10/18 1759) Pulse Rate: 81 (10/18 1700)  Labs: Recent Labs    12/05/18 1029 12/06/18 0647 12/07/18 0157  HGB 11.3*  --   --   HCT 33.9*  --   --   PLT 213  --   --   CREATININE 1.33* 1.29* 1.12    Estimated Creatinine Clearance: 67.7 mL/min (by C-G formula based on SCr of 1.12 mg/dL).   Medical History: Past Medical History:  Diagnosis Date  . Atrial fibrillation ([place])   . CHF (congestive heart failure) ([place])   . Hepatitis C   . Hypertension   . Stroke ([place])   . Visit for monitoring Tikosyn therapy 03/26/2017     Assessment: 81 yoM with rapid AFib and DVT with hx recent hemorrhagic stroke. Pharmacy cleared to start IV heparin (low goal no bolus) by CCM and neurology with stable head CT.  Goal of Therapy:  Heparin level 0.3-0.5 units/ml Monitor platelets by anticoagulation protocol: Yes   Plan:  -Heparin 1050 units/hr with no bolus -Check 6hr heparin level -Daily heparin level and CBC -Watch neuro status closely   Glenn [name], PharmD, BCPS Clinical Pharmacist [phone] Please check AMION for all [place] numbers 12/07/2018

## 2018-12-07 NOTE — Progress Notes (Signed)
Reviewed head imaging with Neurology. Ok to start heparin gtt (no bolus). Will monitor with Neuro checks q4 after initiation of anticoagulation.   Rodman Pickle, MD Pulm/CCM

## 2018-12-07 NOTE — Progress Notes (Signed)
NAME:  Glenn [name], MRN:  [id number], DOB:  [birth year], LOS: 5 ADMISSION DATE:  12/02/2018, CONSULTATION DATE: 12/02/2018 REFERRING MD: Dr. Blaine Hamper, CHIEF COMPLAINT: Respiratory distress  Brief History   68 year old gentleman transferred from TR with fever, A. fib with RVR, abdominal pain, respiratory distress Consult for medical management Recent CVA  Extensive DVT-post IVC filter placement  History of present illness   68 year old male with past medical history significant for hypertension, hyperlipidemia, A. Fib previously on eliquis, HFrEF with EF 15-20% s/p ICD and cardioversion 10/29/2018, Hep C, CKD stage III, who most recently being admitted on 9/21 with left hemiparessis found to have large right MCA subacute hemorrhage. Additionally found to have acute nonischemic cardiomyopathy with EF 15-20% s/p ICD placement and cardioversion 9/9 for Afib.  Discharge 10/13  On empiric antibiotics-Zosyn   On nasal cannula at present  Past Medical History   Past Medical History:  Diagnosis Date  . Atrial fibrillation ([place])   . CHF (congestive heart failure) ([place])   . Hepatitis C   . Hypertension   . Stroke ([place])   . Visit for monitoring Tikosyn therapy 03/26/2017   Significant Hospital Events   Acute respiratory failure 12/02/2018-initiated rapid response and transferred to the ICU  Consults:  PCCM on 12/02/2018  Procedures:    Significant Diagnostic Tests:  12/02/2018 CTH a/p >> 1. No evidence of retroperitoneal hematoma. 2. Small right pleural effusion layering dependently. Mild atelectasis or infiltrate at the right lung base. 3. Aortic atherosclerosis. 4. Gallstones in the gallbladder but no CT evidence of cholecystitis. 5. No intraperitoneal abscess. No acute bowel pathology evident. Aortic Atherosclerosis   Micro Data:  10/13 blood culture x2-no growth  10/13 urine culture>> 10/16 blood cultures x2>> 1 of 2 CoN Staph 10/17 sputum culture > not acceptable   Antimicrobials:  10/8 ceftriaxone completed  10/13 cefepime 10/13 zosyn >> 10/13 vanc   Interim history/subjective:  Tamx 103.1.   Objective   Blood pressure (!) 164/83, pulse (!) 125, temperature 98.6 F (37 C), temperature source Axillary, resp. rate (!) 32, weight 85.3 kg, SpO2 100 %.    FiO2 (%):  [30 %] 30 %   Intake/Output Summary (Last 24 hours) at 12/07/2018 0851 Last data filed at 12/07/2018 0600 Gross per 24 hour  Intake 1726.93 ml  Output 2150 ml  Net -423.07 ml   Physical Exam: General: Chronically ill-appearing, no acute distress HENT: [place], AT, OP clear, MMM Eyes: EOMI, no scleral icterus Respiratory: Diminished breath sounds bilaterally. No crackles, wheezing or rales Cardiovascular: Irregular rate and rhythm, -M/R/G, no JVD GI: BS+, soft, nontender Extremities: 1+ pedal edema,-tenderness Neuro: AAO x4, left hemiparesis Skin: Intact, no rashes or bruising Psych: Normal mood, normal affect GU: Condom cath in place  CXR 12/06/18 - Right pleural effusion, increased  Resolved Hospital Problem list     Assessment & Plan:   .  Sepsis - remains febrile. Cultures negative so far. 1 of 2 BCx with CoNS which likely represents contaminant. May be related to his DVT or possible aspiration. His cardiac hardware and persistent pleural effusion may also represent a source of infection. If echocardiogram negative, will consider diagnostic thoracentesis. -Continue Zosyn -F/u final cultures -F/u echocardiogram  .  Atrial fibrillation with rapid ventricular response - may be driven in setting of infection. Received BB x 2 overnight -Appreciate cardiology assistance -On digoxin and amiodarone -Holding anticoagulation for now -Repeat diuresis x 1 today  .  Extensive DVT -Status post IVC filter placement in setting of  recent hemorrhagic stroke -Consult Neurology regarding restarting anticoagulation  .  History of MCA hemorrhagic stroke with residual facial droop,  left-sided hemiparesis and dysphagia -Passed swallow evaluation -Repeat CT head and pending results, discuss with Neuro regarding restarting anticoagulation  .  Acute respiratory failure - improving -Wean supplemental oxygen for goal SpO2 88-95%. BiPAP as needed -Continue bronchodilator treatments -Antibiotics as noted below  .  History of hepatitis C -Outpatient follow-up  Best practice:  Diet: Diet Pain/Anxiety/Delirium protocol (if indicated): Morphine as needed VAP protocol (if indicated): Not applicable DVT prophylaxis: DVT lower extremity, cannot be anticoagulated secondary to recent hemorrhagic CVA GI prophylaxis: Not applicable Glucose control: Monitor glucose Mobility: Bedrest Code Status: Full code Family Communication: Updated Son [name] on 10/18 Disposition: Remain in ICU  Labs   CBC: Recent Labs  Lab 12/01/18 0726 12/02/18 1528 12/02/18 2018 12/02/18 2202 12/04/18 0333 12/05/18 1029  WBC 4.7 11.3* 15.6*  --  17.3* 15.9*  NEUTROABS 2.4  --  12.3*  --   --  13.0*  HGB 11.0* 12.6* 12.3* 10.9* 11.2* 11.3*  HCT 33.1* 38.0* 36.1* 32.0* 33.5* 33.9*  MCV 95.7 95.7 94.5  --  96.0 96.0  PLT 220 276 278  --  214 123456    Basic Metabolic Panel: Recent Labs  Lab 12/03/18 0225 12/03/18 0856 12/04/18 0333 12/05/18 1029 12/05/18 2358 12/06/18 0647 12/07/18 0157  NA  --  138 138 137  --  139 139  K  --  5.2* 4.5 3.9  --  4.1 4.4  CL  --  105 103 102  --  104 103  CO2  --  24 23 23   --  23 24  GLUCOSE  --  139* 138* 122*  --  109* 140*  BUN  --  17 16 19   --  26* 25*  CREATININE 1.55* 1.59* 1.49* 1.33*  --  1.29* 1.12  CALCIUM  --  8.6* 8.8* 8.4*  --  8.6* 8.3*  MG 1.8  --   --   --  2.4  --  2.1   GFR: Estimated Creatinine Clearance: 67.7 mL/min (by C-G formula based on SCr of 1.12 mg/dL). Recent Labs  Lab 12/02/18 1528 12/02/18 2008 12/02/18 2018 12/02/18 2209 12/02/18 2220 12/04/18 0333 12/05/18 1029  PROCALCITON  --   --   --  0.17  --   --   --    WBC 11.3*  --  15.6*  --   --  17.3* 15.9*  LATICACIDVEN  --  1.2  --   --  1.5  --   --     ABG    Component Value Date/Time   PHART 7.402 12/02/2018 2202   PCO2ART 38.3 12/02/2018 2202   PO2ART 447.0 (H) 12/02/2018 2202   HCO3 23.8 12/02/2018 2202   TCO2 25 12/02/2018 2202   ACIDBASEDEF 1.0 12/02/2018 2202   O2SAT 100.0 12/02/2018 2202     Past Medical History  He,  has a past medical history of Atrial fibrillation ([place]), CHF (congestive heart failure) ([place]), Hepatitis C, Hypertension, Stroke ([place]), and Visit for monitoring Tikosyn therapy (03/26/2017).   The patient is critically ill and requires high complexity decision making for assessment and support, frequent evaluation and titration of therapies, application of advanced monitoring technologies and extensive interpretation of multiple databases.   Critical Care Time devoted to patient care services described in this note is 35 Minutes. This time reflects time of care of this signee Dr. Rodman Pickle.  Rodman Pickle, M.D. [facility] Pulmonary/Critical Care Medicine 12/07/2018 8:52 AM  Pager: [phone] After hours pager: [phone]

## 2018-12-07 NOTE — Progress Notes (Signed)
Cardiology Progress Note  Patient ID: Glenn [name] MRN: [id number] DOB: 1950/05/04 Date of Encounter: 12/07/2018  Primary Cardiologist: Virl Axe, MD  Subjective  Rates no better after digoxin.  Plenty of blood pressure.  He is a little more alert today, good urine output.  ROS:  All other ROS reviewed and negative. Pertinent positives noted in the HPI.     Inpatient Medications  Scheduled Meds:  chlorhexidine  15 mL Mouth Rinse BID   Chlorhexidine Gluconate Cloth  6 each Topical Daily   feeding supplement (PRO-STAT SUGAR FREE 64)  30 mL Per Tube Daily   furosemide  40 mg Intravenous Once   gabapentin  300 mg Oral Q8H   ipratropium  0.5 mg Nebulization Q6H   levalbuterol  0.63 mg Nebulization Q6H   mouth rinse  15 mL Mouth Rinse q12n4p   metoprolol tartrate  25 mg Oral Q6H   Continuous Infusions:  feeding supplement (OSMOLITE 1.5 CAL) 1,000 mL (12/07/18 0217)   piperacillin-tazobactam (ZOSYN)  IV 12.5 mL/hr at 12/07/18 0600   PRN Meds: acetaminophen, bisacodyl, morphine injection   Vital Signs   Vitals:   12/07/18 0752 12/07/18 0800 12/07/18 0819 12/07/18 0821  BP:  (!) 164/83    Pulse:  (!) 125    Resp:  (!) 32    Temp: 97.6 F (36.4 C)     TempSrc: Oral     SpO2:  98% 99% 100%  Weight:        Intake/Output Summary (Last 24 hours) at 12/07/2018 1052 Last data filed at 12/07/2018 0600 Gross per 24 hour  Intake 1574.52 ml  Output 2150 ml  Net -575.48 ml   Last 3 Weights 12/07/2018 12/06/2018 12/05/2018  Weight (lbs) 188 lb 0.8 oz 190 lb 4.1 oz 187 lb 2.7 oz  Weight (kg) 85.3 kg 86.3 kg 84.9 kg      Telemetry  Overnight telemetry shows atrial fibrillation with RVR rates in the 120-150 range, which I personally reviewed.   Physical Exam   Vitals:   12/07/18 0752 12/07/18 0800 12/07/18 0819 12/07/18 0821  BP:  (!) 164/83    Pulse:  (!) 125    Resp:  (!) 32    Temp: 97.6 F (36.4 C)     TempSrc: Oral     SpO2:  98% 99% 100%    Weight:         Intake/Output Summary (Last 24 hours) at 12/07/2018 1052 Last data filed at 12/07/2018 0600 Gross per 24 hour  Intake 1574.52 ml  Output 2150 ml  Net -575.48 ml    Last 3 Weights 12/07/2018 12/06/2018 12/05/2018  Weight (lbs) 188 lb 0.8 oz 190 lb 4.1 oz 187 lb 2.7 oz  Weight (kg) 85.3 kg 86.3 kg 84.9 kg    Body mass index is 29.45 kg/m.  General: Alert awake, follows commands Head: Atraumatic, normal size  Eyes: PEERLA, EOMI  Neck: JVD noted around 10 cm of water Endocrine: No thryomegaly Cardiac: Irregular rhythm Lungs: Crackles bilaterally, right more prominent Abd: Soft, nontender, no hepatomegaly  Ext: 1+ lower extreme edema Musculoskeletal: No deformities, BUE and BLE strength normal and equal Skin: Warm and dry, no rashes   Neuro: Alert and oriented to person, place, time, and situation, CNII-XII grossly intact, no focal deficits  Psych: Normal mood and affect   Labs  High Sensitivity Troponin:   Recent Labs  Lab 12/02/18 2209 12/03/18 0225  TROPONINIHS 26* 25*     Cardiac EnzymesNo results for input(s): TROPONINI  in the last 168 hours. No results for input(s): TROPIPOC in the last 168 hours.  Chemistry Recent Labs  Lab 12/01/18 0726 12/02/18 1528  12/02/18 2209  12/05/18 1029 12/06/18 0647 12/07/18 0157  NA 139 137   < > 136   < > 137 139 139  K 4.3 4.9   < > 6.1*   < > 3.9 4.1 4.4  CL 104 102  --  103   < > 102 104 103  CO2 25 23  --  22   < > 23 23 24   GLUCOSE 115* 149*  --  136*   < > 122* 109* 140*  BUN 11 15  --  17   < > 19 26* 25*  CREATININE 1.07 1.39*  --  1.66*   < > 1.33* 1.29* 1.12  CALCIUM 9.0 9.2  --  8.5*   < > 8.4* 8.6* 8.3*  PROT 6.2* 7.4  --  6.5  --   --   --   --   ALBUMIN 2.6* 3.1*  --  2.7*  --   --   --   --   AST 24 25  --  26  --   --   --   --   ALT 14 16  --  16  --   --   --   --   ALKPHOS 55 68  --  61  --   --   --   --   BILITOT 0.5 0.8  --  0.8  --   --   --   --   GFRNONAA >60 52*  --  42*   < > 55*  57* >60  GFRAA >60 >60  --  49*   < > >60 >60 >60  ANIONGAP 10 12  --  11   < > 12 12 12    < > = values in this interval not displayed.    Hematology Recent Labs  Lab 12/02/18 2018 12/02/18 2202 12/04/18 0333 12/05/18 1029  WBC 15.6*  --  17.3* 15.9*  RBC 3.82*  --  3.49* 3.53*  HGB 12.3* 10.9* 11.2* 11.3*  HCT 36.1* 32.0* 33.5* 33.9*  MCV 94.5  --  96.0 96.0  MCH 32.2  --  32.1 32.0  MCHC 34.1  --  33.4 33.3  RDW 12.5  --  12.5 12.5  PLT 278  --  214 213   BNP Recent Labs  Lab 12/02/18 2032 12/05/18 1029  BNP 566.2* 450.0*    DDimer No results for input(s): DDIMER in the last 168 hours.   Radiology  Dg Chest Port 1 View  Result Date: 12/06/2018 CLINICAL DATA:  Hemoptysis. EXAM: PORTABLE CHEST 1 VIEW COMPARISON:  12/04/2018 FINDINGS: Soft feeding tube enters the stomach. Pacemaker/AICD remains in place. Left chest remains clear. Persistent right effusion with atelectasis of the mid and right lung. Effusion is probably larger than on the previous study. IMPRESSION: Persistent right effusion and atelectasis. The effusion appears larger than on the previous study. No other significant change. Electronically Signed   By: Nelson Chimes M.D.   On: 12/06/2018 10:14   Dg Abd Portable 1v  Result Date: 12/05/2018 CLINICAL DATA:  Encounter for feeding tube placement EXAM: PORTABLE ABDOMEN - 1 VIEW COMPARISON:  Yesterday FINDINGS: Feeding tube with tip over the proximal stomach. IVC filter in place. Normal bowel gas pattern. IMPRESSION: Feeding tube with tip over the proximal stomach. Electronically Signed   By: Angelica Chessman  Watts M.D.   On: 12/05/2018 11:31    Cardiac Studies  Echocardiogram pending, my limited review shows systolic function severely reduced 15 to 20%, A. fib with RVR, mild MR, moderate TR, no obvious vegetation on the tricuspid valve or pacer leads  Patient Profile  Glenn [name] is a 68 y.o. male with nonischemic cardiomyopathy, ejection fraction 15 to 20%  status post ICD placement, atrial fibrillation status post multiple attempts at restoration of sinus rhythm without luck, recent hemorrhagic stroke, not on anticoagulation, left lower extremity DVT, status post IVC filter placement who is currently in the medical ICU with what appears to be persistent fever and respiratory failure secondary to possible pneumonia.  Cardiology has been asked to assist with management of atrial fibrillation with RVR.  Assessment & Plan  1.  Atrial fibrillation with RVR -He is warm on exam and has been persistently febrile for 3 days.  There is concerns for possible bacteremia and blood cultures are in the lab.  There are also concerns for possible aspiration pneumonia.  Critical care medicine team with possible plans to tap his right pleural effusions this may be source of infection.  He is warm on exam with plenty of blood pressure. -Limited control digoxin -Given that he has plenty of blood pressure, we will transition to 25 mg p.o. metoprolol tartrate every 6 hours and uptitrate for better rate control.  Rates are not that bad in the 03/11/1928 range during my examination. -Critical care medicine with plans to repeat CT head to determine if anticoagulation can be restarted.  Eliquis will be okay if neurosurgery is okay with this after CT head.  2.  Fever with cardiac hardware -We will follow-up formal echo read -Coag negative staph in blood, this is reassuring  3.  Nonischemic systolic heart failure, ejection fraction 15 to 20%, recent left heart catheterization with normal coronary arteries -We will continue with diuresis today and give 40 mg IV twice daily.  Continue to hold ACE/ARB   For questions or updates, please contact [facility] Please consult [email or web address] for contact info under        Signed, [place] T. Audie Box, [place]  12/07/2018 10:52 AM

## 2018-12-07 NOTE — Consult Note (Signed)
Requesting Physician: Dr. Rodman Pickle    Reason for consult: Question regarding restarting anticoagulation  History obtained from: Patient and Chart     HPI:                                                                                                                                       Glenn [name] is a 68 y.o. male with past medical history of atrial fibrillation on Eliquis, CHF, hepatitis C, hypertension, ICD placement and recent stroke on 10/2018 resulting in large right MCA stroke with hemorrhagic conversion likely due to atrial fibrillation on anticoagulation status post reversal with K Centra.  Patient ended up having hemorrhage in the right caudate head with small lateral ventricle hemorrhage.  Patient was discharged to inpatient rehab however readmitted with fever, A. fib with RVR, abdominal pain and respiratory distress.  Work-up for infection so far negative, blood cultures have been negative and echocardiogram shows no vegetations.  Lower extremity venous Doppler revealed extensive DVT and neurology was consulted for restarting anticoagulation.  Repeat CT head was performed which shows resolution of acute blood on the CT and no motion of the right MCA infarction.    Past Medical History:  Diagnosis Date  . Atrial fibrillation ([place])   . CHF (congestive heart failure) ([place])   . Hepatitis C   . Hypertension   . Stroke ([place])   . Visit for monitoring Tikosyn therapy 03/26/2017    Past Surgical History:  Procedure Laterality Date  . CARDIAC DEFIBRILLATOR PLACEMENT  2015  . CARDIOVERSION N/A 10/10/2016   Procedure: CARDIOVERSION;  Surgeon: Dorothy Spark, MD;  Location: [place];  Service: Cardiovascular;  Laterality: N/A;  . CARDIOVERSION N/A 03/27/2017   Procedure: CARDIOVERSION;  Surgeon: Jerline Pain, MD;  Location: [facility] ENDOSCOPY;  Service: Cardiovascular;  Laterality: N/A;  . CARDIOVERSION N/A 10/29/2018   Procedure: CARDIOVERSION;  Surgeon: Sanda Klein,  MD;  Location: [facility] ENDOSCOPY;  Service: Cardiovascular;  Laterality: N/A;  . CARDIOVERSION N/A 11/05/2018   Procedure: CARDIOVERSION;  Surgeon: Acie Fredrickson Wonda Cheng, MD;  Location: [place];  Service: Cardiovascular;  Laterality: N/A;  . EYE SURGERY Left 1990  . IR IVC FILTER PLMT / S&I /IMG GUID/MOD SED  12/04/2018  . IR PERCUTANEOUS ART THROMBECTOMY/INFUSION INTRACRANIAL INC DIAG ANGIO  09/05/2016  . IR RADIOLOGIST EVAL & MGMT  10/03/2016  . RADIOLOGY WITH ANESTHESIA N/A 09/05/2016   Procedure: RADIOLOGY WITH ANESTHESIA;  Surgeon: Luanne Bras, MD;  Location: [place];  Service: Radiology;  Laterality: N/A;  . RIGHT/LEFT HEART CATH AND CORONARY ANGIOGRAPHY N/A 11/03/2018   Procedure: RIGHT/LEFT HEART CATH AND CORONARY ANGIOGRAPHY;  Surgeon: Lorretta Harp, MD;  Location: [facility] CV LAB;  Service: Cardiovascular;  Laterality: N/A;    Family History  Problem Relation Age of Onset  . High blood pressure Mother   . High blood pressure Father   . Stroke Maternal Aunt   .  Heart disease Neg Hx    Social History:  reports that he has been smoking cigarettes. He has been smoking about 0.50 packs per day. He has never used smokeless tobacco. He reports previous alcohol use of about 3.0 standard drinks of alcohol per week. He reports current drug use. Frequency: 2.00 times per week. Drug: Marijuana.  Allergies:  Allergies  Allergen Reactions  . Benadryl [Diphenhydramine] Palpitations    Medications:                                                                                                                        I reviewed home medications   ROS:                                                                                                                                     14 systems reviewed and negative except above    Examination:                                                                                                      General: Appears well-developed  Psych:  Affect appropriate to situation Eyes: No scleral injection HENT: No OP obstrucion Head: Normocephalic.  Cardiovascular: Irregular rate and rhythm Respiratory: Effort normal and breath sounds normal to anterior ascultation GI: Soft.  No distension. There is no tenderness.  Skin: WDI  Ext: Left lower leg swollen  Neurological Examination Mental Status: Alert, oriented, thought content appropriate.  Speech fluent without evidence of aphasia.  Able to follow commands without any difficulty Cranial Nerves: II: Visual fields : Grossly normal, no significant neglect III,IV, VI: ptosis not present, extra-ocular motions intact bilaterally, pupils equal, round, reactive to light and accommodation VII: Left facial droop, VIII: hearing normal bilaterally XII: midline tongue extension Motor: Right : Upper extremity   5/5    Left:     Upper extremity   2+-3/5  Lower extremity   5/5     Lower extremity   0/5 Tone  and bulk: Increased tone in the left side Sensory: Reduced sensation to light touch and pinprick on the left side compared to the right with left hemisensory neglect Plantars: Right: downgoing   Left: downgoing Cerebellar: No gross ataxia noted on the right side      Lab Results: Basic Metabolic Panel: Recent Labs  Lab 12/03/18 0225 12/03/18 0856 12/04/18 0333 12/05/18 1029 12/05/18 2358 12/06/18 0647 12/07/18 0157  NA  --  138 138 137  --  139 139  K  --  5.2* 4.5 3.9  --  4.1 4.4  CL  --  105 103 102  --  104 103  CO2  --  24 23 23   --  23 24  GLUCOSE  --  139* 138* 122*  --  109* 140*  BUN  --  17 16 19   --  26* 25*  CREATININE 1.55* 1.59* 1.49* 1.33*  --  1.29* 1.12  CALCIUM  --  8.6* 8.8* 8.4*  --  8.6* 8.3*  MG 1.8  --   --   --  2.4  --  2.1    CBC: Recent Labs  Lab 12/01/18 0726 12/02/18 1528 12/02/18 2018 12/02/18 2202 12/04/18 0333 12/05/18 1029  WBC 4.7 11.3* 15.6*  --  17.3* 15.9*  NEUTROABS 2.4  --  12.3*  --   --  13.0*  HGB 11.0* 12.6* 12.3*  10.9* 11.2* 11.3*  HCT 33.1* 38.0* 36.1* 32.0* 33.5* 33.9*  MCV 95.7 95.7 94.5  --  96.0 96.0  PLT 220 276 278  --  214 213    Coagulation Studies: No results for input(s): LABPROT, INR in the last 72 hours.  Imaging: Dg Chest Port 1 View  Result Date: 12/06/2018 CLINICAL DATA:  Hemoptysis. EXAM: PORTABLE CHEST 1 VIEW COMPARISON:  12/04/2018 FINDINGS: Soft feeding tube enters the stomach. Pacemaker/AICD remains in place. Left chest remains clear. Persistent right effusion with atelectasis of the mid and right lung. Effusion is probably larger than on the previous study. IMPRESSION: Persistent right effusion and atelectasis. The effusion appears larger than on the previous study. No other significant change. Electronically Signed   By: Nelson Chimes M.D.   On: 12/06/2018 10:14     ASSESSMENT AND PLAN   Large R MCA infarct with hemorrhagic conversion, embolic secondary to known AF on Eliquis s/p reversal w/ Kcentra, now presenting with extensive lower extremity DVT.  Patient did receive IVC filter in the left leg.  CCM consulted regarding resuming anticoagulation,     Recommendations OK in starting with low-intensity non-bolus heparin drip and close neurological monitoring with moderate risk of hemorrhagic conversion.  However benefit of anticoagulation may outweigh risks especially in this patient with atrial fibrillation with high CHADS2VASC2  Score and a EF of 20% and extensive DVT. May eventually transition to oral anticoagulant  Follow-up with stroke clinic in 2 to 4 weeks    [facility] Pager Number [phone]

## 2018-12-07 NOTE — Progress Notes (Signed)
  Echocardiogram 2D Echocardiogram has been performed.  Glenn [name] 12/07/2018, 9:58 AM

## 2018-12-07 NOTE — Progress Notes (Signed)
Patient's HR 150-180 and BP 170/106. Spoke with Hassell Done, MD with Cardiology. Received one time order for 25mg  metoprolol PO, and increase scheduled Q6 metoprolol to 50mg . Hassell Done, MD advised this RN to discuss BP concerns with the critical care team. Oletta Darter, MD ordered for PRN hydralazine 25mg  PO. The patient is having complaints to abdominal pain, so this RN also administered PRN morphine and Tylenol. Will continue to monitor.

## 2018-12-07 NOTE — Progress Notes (Signed)
eLink Physician-Brief Progress Note Patient Name: Glenn [name] DOB: 1950-04-29 MRN: [id number]   Date of Service  12/07/2018  HPI/Events of Note  Hypertension - BP = 168/123. Hydralazine IV on hospital backorder.   eICU Interventions  Will order: 1. Hydralazine 25 mg PO Q 6 hours PRN SBP > 160 or DBP > 100.      Intervention Category Major Interventions: Hypertension - evaluation and management  Sommer,Steven Eugene 12/07/2018, 9:58 PM

## 2018-12-07 NOTE — Progress Notes (Addendum)
eLink Physician-Brief Progress Note Patient Name: Glenn [name] DOB: October 25, 1950 MRN: [id number]   Date of Service  12/07/2018  HPI/Events of Note  AFIB with RVR - Ventricular rate = 143. BP = 143/108.   eICU Interventions  Will order: 1. Metoprolol 5 mg IV X 1 now.  2. BMP and Mg++ level now.      Intervention Category Major Interventions: Arrhythmia - evaluation and management  Sommer,Steven Eugene 12/07/2018, 1:07 AM

## 2018-12-08 DIAGNOSIS — R6521 Severe sepsis with septic shock: Secondary | ICD-10-CM

## 2018-12-08 DIAGNOSIS — I10 Essential (primary) hypertension: Secondary | ICD-10-CM

## 2018-12-08 DIAGNOSIS — J690 Pneumonitis due to inhalation of food and vomit: Secondary | ICD-10-CM

## 2018-12-08 LAB — CBC
HCT: 34.5 % — ABNORMAL LOW (ref 39.0–52.0)
Hemoglobin: 11.4 g/dL — ABNORMAL LOW (ref 13.0–17.0)
MCH: 31.2 pg (ref 26.0–34.0)
MCHC: 33 g/dL (ref 30.0–36.0)
MCV: 94.5 fL (ref 80.0–100.0)
Platelets: 352 10*3/uL (ref 150–400)
RBC: 3.65 MIL/uL — ABNORMAL LOW (ref 4.22–5.81)
RDW: 13 % (ref 11.5–15.5)
WBC: 18.3 10*3/uL — ABNORMAL HIGH (ref 4.0–10.5)
nRBC: 0 % (ref 0.0–0.2)

## 2018-12-08 LAB — GLUCOSE, CAPILLARY
Glucose-Capillary: 133 mg/dL — ABNORMAL HIGH (ref 70–99)
Glucose-Capillary: 134 mg/dL — ABNORMAL HIGH (ref 70–99)
Glucose-Capillary: 136 mg/dL — ABNORMAL HIGH (ref 70–99)
Glucose-Capillary: 141 mg/dL — ABNORMAL HIGH (ref 70–99)
Glucose-Capillary: 152 mg/dL — ABNORMAL HIGH (ref 70–99)

## 2018-12-08 LAB — HEPARIN LEVEL (UNFRACTIONATED)
Heparin Unfractionated: <0.1 IU/mL — ABNORMAL LOW (ref 0.30–0.70)
Heparin Unfractionated: <0.1 IU/mL — ABNORMAL LOW (ref 0.30–0.70)
Heparin Unfractionated: <0.1 IU/mL — ABNORMAL LOW (ref 0.30–0.70)

## 2018-12-08 MED ORDER — METOPROLOL TARTRATE 5 MG/5ML IV SOLN
5.0000 mg | Freq: Once | INTRAVENOUS | Status: AC
  Administered 2018-12-08: 5 mg via INTRAVENOUS
  Filled 2018-12-08: qty 5

## 2018-12-08 MED ORDER — DILTIAZEM HCL-DEXTROSE 125-5 MG/125ML-% IV SOLN (PREMIX)
5.0000 mg/h | INTRAVENOUS | Status: DC
  Administered 2018-12-08: 21:00:00 15 mg/h via INTRAVENOUS
  Administered 2018-12-08: 5 mg/h via INTRAVENOUS
  Administered 2018-12-09: 10 mg/h via INTRAVENOUS
  Administered 2018-12-09: 06:00:00 15 mg/h via INTRAVENOUS
  Administered 2018-12-11 – 2018-12-12 (×2): 5 mg/h via INTRAVENOUS
  Filled 2018-12-08 (×8): qty 125

## 2018-12-08 MED ORDER — METOPROLOL TARTRATE 5 MG/5ML IV SOLN
5.0000 mg | INTRAVENOUS | Status: DC | PRN
  Administered 2018-12-08 (×2): 5 mg via INTRAVENOUS
  Filled 2018-12-08 (×2): qty 5

## 2018-12-08 MED ORDER — DILTIAZEM LOAD VIA INFUSION
10.0000 mg | Freq: Once | INTRAVENOUS | Status: AC
  Administered 2018-12-08: 10 mg via INTRAVENOUS
  Filled 2018-12-08: qty 10

## 2018-12-08 NOTE — Progress Notes (Signed)
  Speech Language Pathology Treatment: Dysphagia  Patient Details Name: Glenn [name] MRN: [id number] DOB: February 27, 1950 Today's Date: 12/08/2018 Time: [id number] SLP Time Calculation (min) (ACUTE ONLY): 10 min  Assessment / Plan / Recommendation Clinical Impression  Skilled treatment session focused on dysphagia. Pt initially on BiPAP but RT removed and pt tolerated [place] at 2 liters O2. Pt's breakfast tray present. SLP facilitated session by providing assistance with self-feeding and providing skilled observation of pt consuming dysphagia 1 with thin liquids via straw. Pt required Mod A faded to Min A cues to consume single sips via straw. Orally pt has extended oral phase d/t prolonged mastication of puree with trace reside oral residue present post swallow. Mod A to Min A verbal cues and liquid wash helpful in clearing all residue. Per chart review, prior to admission to acute, pt with chronically prolonged oral phase with puree. Therefore recommend continuing puree with thin liquids. Education provided to pt and his nurse on remaining on puree diet with thin liquids in immediate present. Pt agreeable and voiced understanding. ST to follow for safe consumption.   HPI HPI: 68 year old male with past medical history significant for hypertension, hyperlipidemia, A. Fib previously on eliquis, HFrEF with EF 15-20% s/p ICD and cardioversion 10/29/2018, Hep C, CKD stage III, who admitted on 9/21 with left hemiparessis found to have large right MCA subacute hemorrhage. Additionally found to have acute nonischemic cardiomyopathy with EF 15-20% s/p ICD placement and cardioversion 9/9 for Afib. Pt with CIR admission from 9/25 to 10/13. Discharge back to acute with fever, A. fib with RVR, abdominal pain, respiratory distress with diagnosis of DVT. Pt awaiting IVC. Chest x-ray (12/04/18) concerning for increased opacity at the RIGHT lung base consistent with pleural effusion and atelectasis or infiltrate. Pt currently  NPO d/t concerns of aspiration in setting of continued cognitive deficits and respiratory distress.       SLP Plan  Continue with current plan of care       Recommendations  Diet recommendations: Dysphagia 1 (puree);Thin liquid Liquids provided via: Straw;Cup Medication Administration: Crushed with puree Supervision: Staff to assist with self feeding;Full supervision/cueing for compensatory strategies Compensations: Minimize environmental distractions;Slow rate Postural Changes and/or Swallow Maneuvers: Seated upright 90 degrees                Oral Care Recommendations: Oral care BID Follow up Recommendations: Skilled Nursing facility SLP Visit Diagnosis: Dysphagia, oropharyngeal phase (R13.12) Plan: Continue with current plan of care       GO                Jaianna Nicoll 12/08/2018, 9:15 AM

## 2018-12-08 NOTE — Progress Notes (Signed)
[place] for heparin Indication: atrial fibrillation, extensive DVT, high-probability PE  Allergies  Allergen Reactions  . Benadryl [Diphenhydramine] Palpitations    Patient Measurements: Height: 5\' 7"  (170.2 cm) Weight: 191 lb 9.3 oz (86.9 kg) IBW/kg (Calculated) : 66.1 Heparin Dosing Weight: 85kg  Vital Signs: Temp: 101.6 F (38.7 C) (10/19 2000) Temp Source: Oral (10/19 2000) BP: 137/91 (10/19 2100) Pulse Rate: 54 (10/19 2100)  Labs: Recent Labs    12/06/18 0647 12/07/18 0157 12/08/18 0503 12/08/18 1208 12/08/18 2018  HGB  --   --  11.4*  --   --   HCT  --   --  34.5*  --   --   PLT  --   --  352  --   --   HEPARINUNFRC  --   --  <0.10* <0.10* <0.10*  CREATININE 1.29* 1.12  --   --   --     Estimated Creatinine Clearance: 68.3 mL/min (by C-G formula based on SCr of 1.12 mg/dL).   Medical History: Past Medical History:  Diagnosis Date  . Atrial fibrillation ([place])   . CHF (congestive heart failure) ([place])   . Hepatitis C   . Hypertension   . Stroke ([place])   . Visit for monitoring Tikosyn therapy 03/26/2017     Assessment: 73 yoM with rapid AFib and DVT with hx recent hemorrhagic stroke. Pharmacy cleared to start IV heparin (low goal no bolus) by CCM and neurology with stable head CT on 10/19.  -Heparin level remains undetectable after rate increase.   Goal of Therapy:  Heparin level 0.3-0.5 units/ml Monitor platelets by anticoagulation protocol: Yes   Plan:  -No bolus. Increase heparin to 1650 units/hr -Monitor daily heparin level and CBC  Hildred Laser, PharmD Clinical Pharmacist **Pharmacist phone directory can now be found on [email or web address] ([email or web address]).  Listed under [place].

## 2018-12-08 NOTE — Progress Notes (Signed)
NAME:  Glenn [name], MRN:  [id number], DOB:  09/18/1950, LOS: 47 ADMISSION DATE:  12/02/2018, CONSULTATION DATE: 12/02/2018 REFERRING MD: Dr. Blaine Hamper, CHIEF COMPLAINT: Respiratory distress  Brief History   68 year old gentleman transferred from TR with fever, A. fib with RVR, abdominal pain, respiratory distress Consult for medical management Recent CVA  Extensive DVT-post IVC filter placement  History of present illness   68 year old male with past medical history significant for hypertension, hyperlipidemia, A. Fib previously on eliquis, HFrEF with EF 15-20% s/p ICD and cardioversion 10/29/2018, Hep C, CKD stage III, who most recently being admitted on 9/21 with left hemiparessis found to have large right MCA subacute hemorrhage. Additionally found to have acute nonischemic cardiomyopathy with EF 15-20% s/p ICD placement and cardioversion 9/9 for Afib.  Discharge 10/13  On empiric antibiotics-Zosyn   On nasal cannula at present  Past Medical History   Past Medical History:  Diagnosis Date  . Atrial fibrillation ([place])   . CHF (congestive heart failure) ([place])   . Hepatitis C   . Hypertension   . Stroke ([place])   . Visit for monitoring Tikosyn therapy 03/26/2017   Significant Hospital Events   Acute respiratory failure 12/02/2018-initiated rapid response and transferred to the ICU  Consults:  PCCM on 12/02/2018  Procedures:    Significant Diagnostic Tests:  12/02/2018 CTH a/p >> 1. No evidence of retroperitoneal hematoma. 2. Small right pleural effusion layering dependently. Mild atelectasis or infiltrate at the right lung base. 3. Aortic atherosclerosis. 4. Gallstones in the gallbladder but no CT evidence of cholecystitis. 5. No intraperitoneal abscess. No acute bowel pathology evident. Aortic Atherosclerosis  CTH 10/18 > expected evolution of basal ganglia hemorrhage.  No new hemorrhage.  Resolution of IVH.  Expected evolution of prominent anterior right MCA territory  infarct with volume loss. Echo 10/18 > EF 20%, LA mild to mod dilated.  Trivial pericardial effusion.  Micro Data:  10/13 blood culture x2-no growth  10/13 urine culture>> 10/16 blood cultures x2>> 1 of 2 CoN Staph 10/17 sputum culture > not acceptable 10/19 Pleural fluid (thora pending) >   Antimicrobials:  10/8 ceftriaxone completed 10/13 cefepime 10/13 zosyn >> 10/13 vanc > 10/18  Interim history/subjective:  HR into 150s.  Did respond to IV lopressor earlier.  Objective   Blood pressure (!) 153/79, pulse (!) 148, temperature (!) 100.4 F (38 C), temperature source Axillary, resp. rate (!) 32, height 5\' 7"  (1.702 m), weight 86.9 kg, SpO2 98 %.    FiO2 (%):  [30 %] 30 %   Intake/Output Summary (Last 24 hours) at 12/08/2018 0830 Last data filed at 12/08/2018 0800 Gross per 24 hour  Intake 1007.63 ml  Output 2650 ml  Net -1642.37 ml   Physical Exam: General: Adult male, resting in bed, in NAD. Neuro: Opens eyes to voice, follows basic commands. HEENT: [place]/AT. Sclerae anicteric. BiPAP in place. Cardiovascular: Tachy, Mariel Sleet, no M/R/G.  Lungs: Respirations even and unlabored.  CTA bilaterally, No W/R/R. Abdomen: BS x 4, soft, NT/ND.  Musculoskeletal: No gross deformities, no edema.  Skin: Intact, warm, no rashes.   Assessment & Plan:   Sepsis - remains febrile. Cultures negative so far. 1 of 2 BCx with CoNS which likely represents contaminant. May be related to his DVT or possible aspiration. His cardiac hardware and persistent pleural effusion may also represent a source of infection. ; though, echo was unrevealing.  Consider right pleural effusion as source and will look into diagnostic thoracentesis today 10/19. -Continue Zosyn -F/u final  cultures  Atrial fibrillation with rapid ventricular response - may be driven in setting of infection.  Cards following, difficult to manage thus far and now off dig and amio. Cardene unfavorable given low EF of 20%. -Appreciate  cardiology assistance with managing, will need further guidance -Add IV lopressor PRN in the meantime  Extensive DVT -Status post IVC filter placement in setting of recent hemorrhagic stroke.  Cleared by neuro to start anticoagulation. - Continue heparin  History of MCA hemorrhagic stroke with residual facial droop, left-sided hemiparesis and dysphagia.  Repeat head CT stable. -Passed swallow evaluation, continue dysphagia 1 diet.  Acute hypoxic respiratory failure - improving -Wean supplemental oxygen for goal SpO2 88-95%. BiPAP as needed -Continue bronchodilator treatments -Antibiotics as noted below  Right pleural effusion. - Assess chest POCUS today for diagnostic / therapeutic thoracentesis.  History of hepatitis C -Outpatient follow-up  Best practice:  Diet: Dysphagia 1 Pain/Anxiety/Delirium protocol (if indicated): Morphine as needed VAP protocol (if indicated): Not applicable DVT prophylaxis: heparin gtt GI prophylaxis: Not applicable Glucose control: Monitor glucose Mobility: Bedrest Code Status: Full code Family Communication: Updated Son [name] on 10/18.  Will call today 10/19. Disposition: Remain in ICU   Montey Hora, [place] Pager: [phone].  If no answer, [phone] 12/08/2018, 8:44 AM

## 2018-12-08 NOTE — Progress Notes (Signed)
ANTICOAGULATION CONSULT NOTE - Follow Up Consult  Pharmacy Consult for heparin Indication: Afib and h/o DVT  Labs: Recent Labs    12/05/18 1029 12/06/18 0647 12/07/18 0157 12/08/18 0503  HGB 11.3*  --   --  11.4*  HCT 33.9*  --   --  34.5*  PLT 213  --   --  352  HEPARINUNFRC  --   --   --  <0.10*  CREATININE 1.33* 1.29* 1.12  --     Assessment: 68yo male subtherapeutic on heparin with initial dosing for Afib and h/o DVT (on Eliquis PTA); no gtt issues or signs of bleeding per RN; will adjust cautiously given recent hemorrhagic conversion, now resolved.  Goal of Therapy:  Heparin level 0.3-0.5 units/ml   Plan:  Will increase heparin gtt by 2 units/kg/hr to 1200 units/hr and check level in 6 hours.    Wynona Neat, PharmD, BCPS  12/08/2018,6:18 AM

## 2018-12-08 NOTE — Progress Notes (Addendum)
Progress Note  Patient Name: Glenn [name] Date of Encounter: 12/08/2018  Primary Cardiologist: Virl Axe, MD   Subjective   Feeling OK.  Denies palpitations or shortness of breath.   Inpatient Medications    Scheduled Meds: . chlorhexidine  15 mL Mouth Rinse BID  . Chlorhexidine Gluconate Cloth  6 each Topical Daily  . diltiazem  10 mg Intravenous Once  . gabapentin  300 mg Oral Q8H  . ipratropium  0.5 mg Nebulization Q6H  . levalbuterol  0.63 mg Nebulization Q6H  . mouth rinse  15 mL Mouth Rinse q12n4p  . metoprolol tartrate  50 mg Oral Q6H   Continuous Infusions: . diltiazem (CARDIZEM) infusion    . heparin 1,200 Units/hr (12/08/18 0900)  . piperacillin-tazobactam (ZOSYN)  IV Stopped (12/08/18 0829)   PRN Meds: acetaminophen, bisacodyl, calcium carbonate, hydrALAZINE, metoprolol tartrate, morphine injection   Vital Signs    Vitals:   12/08/18 0746 12/08/18 0800 12/08/18 0820 12/08/18 0900  BP:  (!) 153/79  138/66  Pulse:  (!) 139 (!) 148 69  Resp:  (!) 24 (!) 32 (!) 26  Temp: (!) 100.4 F (38 C)     TempSrc: Axillary     SpO2:  98% 98% 99%  Weight:      Height:        Intake/Output Summary (Last 24 hours) at 12/08/2018 0943 Last data filed at 12/08/2018 0900 Gross per 24 hour  Intake 1025.55 ml  Output 2650 ml  Net -1624.45 ml   Last 3 Weights 12/08/2018 12/07/2018 12/06/2018  Weight (lbs) 191 lb 9.3 oz 188 lb 0.8 oz 190 lb 4.1 oz  Weight (kg) 86.9 kg 85.3 kg 86.3 kg      Telemetry    Atrial fibrillation.  Rate 140-160s - Personally Reviewed  ECG    n/a - Personally Reviewed  Physical Exam   VS:  BP 138/66   Pulse 69   Temp (!) 100.4 F (38 C) (Axillary)   Resp (!) 26   Ht 5\' 7"  (1.702 m)   Wt 86.9 kg   SpO2 99%   BMI 30.01 kg/m  , BMI Body mass index is 30.01 kg/m. GENERAL: Ill -appearing HEENT: Pupils equal round and reactive, fundi not visualized, oral mucosa unremarkable.  R facial droop NECK: No jugular venous  distention, waveform within normal limits, carotid upstroke brisk and symmetric, no bruits LUNGS:  Clear to auscultation bilaterally HEART: Tachycardic.  Irregularly irregular.  PMI not displaced or sustained,S1 and S2 within normal limits, no S3, no S4, no clicks, no rubs, no murmurs ABD:  Flat, positive bowel sounds normal in frequency in pitch, no bruits, no rebound, no guarding, no midline pulsatile mass, no hepatomegaly, no splenomegaly EXT:  2 plus pulses throughout, 2+ L LE edema, 1+ R LE edema,  Bilateral UE edema.  No cyanosis no clubbing SKIN:  No rashes no nodules NEURO:  L hemiparesis. PSYCH:  Cognitively intact, oriented to person place and time   Labs    High Sensitivity Troponin:   Recent Labs  Lab 12/02/18 2209 12/03/18 0225  TROPONINIHS 26* 25*      Chemistry Recent Labs  Lab 12/02/18 1528  12/02/18 2209  12/05/18 1029 12/06/18 0647 12/07/18 0157  NA 137   < > 136   < > 137 139 139  K 4.9   < > 6.1*   < > 3.9 4.1 4.4  CL 102  --  103   < > 102 104 103  CO2 23  --  22   < > 23 23 24   GLUCOSE 149*  --  136*   < > 122* 109* 140*  BUN 15  --  17   < > 19 26* 25*  CREATININE 1.39*  --  1.66*   < > 1.33* 1.29* 1.12  CALCIUM 9.2  --  8.5*   < > 8.4* 8.6* 8.3*  PROT 7.4  --  6.5  --   --   --   --   ALBUMIN 3.1*  --  2.7*  --   --   --   --   AST 25  --  26  --   --   --   --   ALT 16  --  16  --   --   --   --   ALKPHOS 68  --  61  --   --   --   --   BILITOT 0.8  --  0.8  --   --   --   --   GFRNONAA 52*  --  42*   < > 55* 57* >60  GFRAA >60  --  49*   < > >60 >60 >60  ANIONGAP 12  --  11   < > 12 12 12    < > = values in this interval not displayed.     Hematology Recent Labs  Lab 12/04/18 0333 12/05/18 1029 12/08/18 0503  WBC 17.3* 15.9* 18.3*  RBC 3.49* 3.53* 3.65*  HGB 11.2* 11.3* 11.4*  HCT 33.5* 33.9* 34.5*  MCV 96.0 96.0 94.5  MCH 32.1 32.0 31.2  MCHC 33.4 33.3 33.0  RDW 12.5 12.5 13.0  PLT 214 213 352    BNP Recent Labs  Lab  12/02/18 2032 12/05/18 1029  BNP 566.2* 450.0*     DDimer No results for input(s): DDIMER in the last 168 hours.   Radiology    Ct Head Wo Contrast  Result Date: 12/07/2018 CLINICAL DATA:  Stroke, follow-up.  Recent hemorrhagic CVA. EXAM: CT HEAD WITHOUT CONTRAST TECHNIQUE: Contiguous axial images were obtained from the base of the skull through the vertex without intravenous contrast. COMPARISON:  CT head 11/11/2018 MR head 11/12/2018 FINDINGS: Brain: The right frontal MCA territory infarct is again seen. There is evolution of the hemorrhage previously noted in the right basal ganglia. Fluid levels are no longer present in the lateral ventricles. Mild prominence of the lateral ventricles is likely due to volume loss. Cortical hyperdensity within the infarct territory likely represents cortical laminar necrosis rather than hemorrhage. No new hemorrhage is present. The left hemisphere is within normal limits. The brainstem and cerebellum are within normal limits. Vascular: Atherosclerotic calcifications are present in the cavernous internal carotid arteries bilaterally. There is no hyperdense vessel. Skull: Calvarium is intact. No focal lytic or blastic lesions are present. Sinuses/Orbits: The paranasal sinuses and mastoid air cells are clear. The globes and orbits are within normal limits. IMPRESSION: 1. Expected evolution of basal ganglia hemorrhage. No hyperdense blood products remain. No new hemorrhage is present. 2. Resolution of intraventricular hemorrhage. 3. Prominent ventricular size likely reflects volume loss of the adjacent parenchyma. 4. Expected evolution of prominent anterior right MCA territory infarct with volume loss. 5. Cortical hyperdensity in the infarct territory likely reflects developing cortical laminar necrosis. No acute hemorrhage is present. Electronically Signed   By: San Morelle M.D.   On: 12/07/2018 17:53    Cardiac Studies   Echo 12/07/18: IMPRESSIONS  1. Left ventricular ejection fraction, by visual estimation, is 20%. The left ventricle has severely decreased function. Mildly increased left ventricular size. There is mildly increased left ventricular hypertrophy.  2. Left ventricular diastolic Doppler parameters are indeterminate in the setting of rapid atrial fibrillation.  3. Global right ventricle has mildly reduced systolic function.The right ventricular size is normal. No increase in right ventricular wall thickness.  4. Left atrial size was mild-moderately dilated.  5. Right atrial size was normal.  6. Trivial pericardial effusion is present.  7. The pericardial effusion is posterior to the left ventricle.  8. Mild aortic valve annular calcification.  9. The mitral valve is grossly normal. Mild mitral valve regurgitation. 10. The tricuspid valve is grossly normal. Tricuspid valve regurgitation mild-moderate. 11. The aortic valve is tricuspid Aortic valve regurgitation is mild to moderate by color flow Doppler. 12. The pulmonic valve was grossly normal. Pulmonic valve regurgitation is mild by color flow Doppler. 13. Moderately elevated pulmonary artery systolic pressure. 14. The inferior vena cava is normal in size with greater than 50% respiratory variability, suggesting right atrial pressure of 3 mmHg. 15. A device wire is visualized. 16. The tricuspid regurgitant velocity is 3.68 m/s, and with an assumed right atrial pressure of 3 mmHg, the estimated right ventricular systolic pressure is moderately elevated at 57.2 mmHg. 17. No obvious vegetations involving valves or device wire.   Patient Profile     68 y.o. male with nonischemic cardiomyopathy status post ICD, consistent atrial fibrillation, left lower extremity DVT status post IVC filter, recent hemorrhagic stroke, CKD III, admitted with persistent fever and respiratory failure thought to be due to pneumonia.  Cardiology has been assisting with atrial fibrillation with rapid  ventricular response.  Assessment & Plan    # Atrial fibrillation with rapid ventricular response: Mr. Force remains in atrial fibrillation.  Rates are poorly-controlled.  His heart rate did not improve on amiodarone or digoxin.  Metoprolol has been appropriately increased to 50 mg every 6 hours.  Despite this his heart rate is still in the 130s to 140s.  Unfortunately there are not many other options.  We will started diltiazem infusion, though this is not ideal in the setting of cardiomyopathy.  It is likely that his rates will be easier to control once he is not septic.  He is on heparin.  He had a recent hemorrhagic stroke.  Plan to initiate Eliquis once cleared by neurosurgery.  # Chronic systolic and diastolic heart failure: He has LE edema 2/2 DVT.  Continue metoprolol and switch to to succinate once dose is stable.  # Sepsis: Source unclear.  Treated empirically for aspirination pneumonia.  No clear micro data.  Thoracentesis fluid pending.  No evidence of hardware infection on TTE.    Time spent: 35 minutes-Greater than 50% of this time was spent in counseling, explanation of diagnosis, planning of further management, and coordination of care.      For questions or updates, please contact [place] Please consult [email or web address] for contact info under        Signed, Skeet Latch, MD  12/08/2018, 9:43 AM

## 2018-12-08 NOTE — Progress Notes (Signed)
eLink Physician-Brief Progress Note Patient Name: Glenn [name] DOB: [birth year] MRN: [id number]   Date of Service  12/08/2018  HPI/Events of Note  AFIB with RVR - Ventricular rate = 145-172 and BP = 170/95. Already on PO Metoprolol.   eICU Interventions  Will order: 1. Metoprolol 5 mg IV now.      Intervention Category Major Interventions: Arrhythmia - evaluation and management;Hypertension - evaluation and management  Sommer,Steven Eugene 12/08/2018, 3:50 AM

## 2018-12-08 NOTE — Progress Notes (Addendum)
[facility] [place] for heparin Indication: atrial fibrillation, extensive DVT, high-probability PE  Allergies  Allergen Reactions  . Benadryl [Diphenhydramine] Palpitations    Patient Measurements: Height: 5\' 7"  (170.2 cm) Weight: 191 lb 9.3 oz (86.9 kg) IBW/kg (Calculated) : 66.1 Heparin Dosing Weight: 85kg  Vital Signs: Temp: 101.1 F (38.4 C) (10/19 1133) Temp Source: Oral (10/19 1133) BP: 124/74 (10/19 1100) Pulse Rate: 118 (10/19 1100)  Labs: Recent Labs    12/06/18 0647 12/07/18 0157 12/08/18 0503 12/08/18 1208  HGB  --   --  11.4*  --   HCT  --   --  34.5*  --   PLT  --   --  352  --   HEPARINUNFRC  --   --  <0.10* <0.10*  CREATININE 1.29* 1.12  --   --     Estimated Creatinine Clearance: 68.3 mL/min (by C-G formula based on SCr of 1.12 mg/dL).   Medical History: Past Medical History:  Diagnosis Date  . Atrial fibrillation ([place])   . CHF (congestive heart failure) ([place])   . Hepatitis C   . Hypertension   . Stroke ([place])   . Visit for monitoring Tikosyn therapy 03/26/2017     Assessment: 34 yoM with rapid AFib and DVT with hx recent hemorrhagic stroke. Pharmacy cleared to start IV heparin (low goal no bolus) by CCM and neurology with stable head CT on 10/19. Heparin level remains undetectable after rate increase. CBC stable. No active bleed or issues with infusion per discussion with RN.  Goal of Therapy:  Heparin level 0.3-0.5 units/ml Monitor platelets by anticoagulation protocol: Yes   Plan:  -No bolus. Increase heparin to 1400 units/hr -Check 6hr heparin level -Monitor daily heparin level and CBC, s/sx bleeding -Watch neuro status closely   Elicia Lamp, PharmD, BCPS Please check AMION for all [facility] contact numbers Clinical Pharmacist 12/08/2018 1:12 PM

## 2018-12-09 ENCOUNTER — Inpatient Hospital Stay (HOSPITAL_COMMUNITY): Payer: Medicare HMO

## 2018-12-09 DIAGNOSIS — I82403 Acute embolism and thrombosis of unspecified deep veins of lower extremity, bilateral: Secondary | ICD-10-CM

## 2018-12-09 DIAGNOSIS — I5043 Acute on chronic combined systolic (congestive) and diastolic (congestive) heart failure: Secondary | ICD-10-CM

## 2018-12-09 DIAGNOSIS — J810 Acute pulmonary edema: Secondary | ICD-10-CM

## 2018-12-09 DIAGNOSIS — G9340 Encephalopathy, unspecified: Secondary | ICD-10-CM

## 2018-12-09 LAB — BASIC METABOLIC PANEL
Anion gap: 16 — ABNORMAL HIGH (ref 5–15)
BUN: 35 mg/dL — ABNORMAL HIGH (ref 8–23)
CO2: 24 mmol/L (ref 22–32)
Calcium: 8.5 mg/dL — ABNORMAL LOW (ref 8.9–10.3)
Chloride: 95 mmol/L — ABNORMAL LOW (ref 98–111)
Creatinine, Ser: 1.45 mg/dL — ABNORMAL HIGH (ref 0.61–1.24)
GFR calc Af Amer: 58 mL/min — ABNORMAL LOW (ref >60)
GFR calc non Af Amer: 50 mL/min — ABNORMAL LOW (ref >60)
Glucose, Bld: 114 mg/dL — ABNORMAL HIGH (ref 70–99)
Potassium: 4.3 mmol/L (ref 3.5–5.1)
Sodium: 135 mmol/L (ref 135–145)

## 2018-12-09 LAB — CBC
HCT: 30.5 % — ABNORMAL LOW (ref 39.0–52.0)
Hemoglobin: 10.1 g/dL — ABNORMAL LOW (ref 13.0–17.0)
MCH: 30.9 pg (ref 26.0–34.0)
MCHC: 33.1 g/dL (ref 30.0–36.0)
MCV: 93.3 fL (ref 80.0–100.0)
Platelets: 339 10*3/uL (ref 150–400)
RBC: 3.27 MIL/uL — ABNORMAL LOW (ref 4.22–5.81)
RDW: 13.5 % (ref 11.5–15.5)
WBC: 21.4 10*3/uL — ABNORMAL HIGH (ref 4.0–10.5)
nRBC: 0 % (ref 0.0–0.2)

## 2018-12-09 LAB — GLUCOSE, CAPILLARY
Glucose-Capillary: 110 mg/dL — ABNORMAL HIGH (ref 70–99)
Glucose-Capillary: 128 mg/dL — ABNORMAL HIGH (ref 70–99)
Glucose-Capillary: 128 mg/dL — ABNORMAL HIGH (ref 70–99)
Glucose-Capillary: 134 mg/dL — ABNORMAL HIGH (ref 70–99)

## 2018-12-09 LAB — HEPARIN LEVEL (UNFRACTIONATED)
Heparin Unfractionated: 0.14 IU/mL — ABNORMAL LOW (ref 0.30–0.70)
Heparin Unfractionated: 0.2 IU/mL — ABNORMAL LOW (ref 0.30–0.70)
Heparin Unfractionated: 0.29 IU/mL — ABNORMAL LOW (ref 0.30–0.70)

## 2018-12-09 LAB — MAGNESIUM: Magnesium: 2.2 mg/dL (ref 1.7–2.4)

## 2018-12-09 LAB — PHOSPHORUS: Phosphorus: 3.9 mg/dL (ref 2.5–4.6)

## 2018-12-09 MED ORDER — DIGOXIN 125 MCG PO TABS
0.1250 mg | ORAL_TABLET | Freq: Three times a day (TID) | ORAL | Status: AC
  Administered 2018-12-09 – 2018-12-10 (×2): 0.125 mg via ORAL
  Filled 2018-12-09 (×2): qty 1

## 2018-12-09 MED ORDER — DIGOXIN 125 MCG PO TABS
0.2500 mg | ORAL_TABLET | Freq: Once | ORAL | Status: AC
  Administered 2018-12-09: 0.25 mg via ORAL
  Filled 2018-12-09: qty 2

## 2018-12-09 MED ORDER — ENSURE ENLIVE PO LIQD
237.0000 mL | Freq: Three times a day (TID) | ORAL | Status: DC
  Administered 2018-12-09 – 2018-12-23 (×23): 237 mL via ORAL

## 2018-12-09 MED ORDER — ADULT MULTIVITAMIN W/MINERALS CH
1.0000 | ORAL_TABLET | Freq: Every day | ORAL | Status: DC
  Administered 2018-12-09 – 2018-12-24 (×15): 1 via ORAL
  Filled 2018-12-09 (×15): qty 1

## 2018-12-09 MED ORDER — FUROSEMIDE 10 MG/ML IJ SOLN
40.0000 mg | Freq: Four times a day (QID) | INTRAMUSCULAR | Status: AC
  Administered 2018-12-09 (×3): 40 mg via INTRAVENOUS
  Filled 2018-12-09 (×3): qty 4

## 2018-12-09 MED ORDER — METOPROLOL SUCCINATE ER 100 MG PO TB24
200.0000 mg | ORAL_TABLET | Freq: Every day | ORAL | Status: DC
  Administered 2018-12-09 – 2018-12-12 (×4): 200 mg via ORAL
  Filled 2018-12-09: qty 4
  Filled 2018-12-09 (×2): qty 2
  Filled 2018-12-09: qty 4
  Filled 2018-12-09: qty 2
  Filled 2018-12-09: qty 4

## 2018-12-09 NOTE — Progress Notes (Addendum)
NAME:  Glenn [name], MRN:  [id number], DOB:  [birth year], LOS: 55 ADMISSION DATE:  12/02/2018, CONSULTATION DATE: 12/02/2018 REFERRING MD: Dr. Blaine Hamper, CHIEF COMPLAINT: Respiratory distress  Brief History   68 year old gentleman transferred from [facility] with fever, A. fib with RVR, abdominal pain, respiratory distress Consult for medical management Recent CVA  Extensive DVT - post IVC filter placement  History of present illness   67 year old male with past medical history significant for hypertension, hyperlipidemia, A. Fib previously on eliquis, HFrEF with EF 15-20% s/p ICD and cardioversion 10/29/2018, Hep C, CKD stage III, who most recently being admitted on 9/21 with left hemiparessis found to have large right MCA subacute hemorrhage. Additionally found to have acute nonischemic cardiomyopathy with EF 15-20% s/p ICD placement and cardioversion 9/9 for Afib.  Discharge 10/13  On empiric antibiotics-Zosyn   On nasal cannula at present  Past Medical History   Past Medical History:  Diagnosis Date  . Atrial fibrillation ([place])   . CHF (congestive heart failure) ([place])   . Hepatitis C   . Hypertension   . Stroke ([place])   . Visit for monitoring Tikosyn therapy 03/26/2017   Significant Hospital Events   Acute respiratory failure 12/02/2018-initiated rapid response and transferred to the ICU  Consults:  PCCM on 12/02/2018  Procedures:    Significant Diagnostic Tests:  12/02/2018 CTH a/p >> No evidence of retroperitoneal hematoma. Small right pleural effusion layering dependently. Mild atelectasis or infiltrate at the right lung base. Gallstones in the gallbladder but no CT evidence of cholecystitis. No intraperitoneal abscess. No acute bowel pathology evident. [place] 10/18 > expected evolution of basal ganglia hemorrhage.  No new hemorrhage.  Resolution of IVH.  Expected evolution of prominent anterior right MCA territory infarct with volume loss. Echo 10/18 > EF 20%, LA mild to mod  dilated.  Trivial pericardial effusion.  Micro Data:  10/13 blood culture x2-no growth  10/13 urine culture>> 10/16 blood cultures x2>> 1 of 2 CoN Staph 10/17 sputum culture > not acceptable 10/19 Pleural fluid (thora pending) >   Antimicrobials:  10/8 ceftriaxone completed 10/13 cefepime 10/13 zosyn >> 10/13 vanc > 10/18  Interim history/subjective:  Responded to diltiazem, HR currently 90s. On room air with SpO2 97%.  Objective   Blood pressure 128/61, pulse (!) 103, temperature 99.5 F (37.5 C), temperature source Oral, resp. rate (!) 36, height 5\' 7"  (1.702 m), weight 85.5 kg, SpO2 94 %.        Intake/Output Summary (Last 24 hours) at 12/09/2018 0824 Last data filed at 12/09/2018 0800 Gross per 24 hour  Intake 771.74 ml  Output 570 ml  Net 201.74 ml   Physical Exam: General: Adult male, resting in bed watching TV, in NAD. Neuro: Opens eyes to voice, follows basic commands. HEENT: [place]/AT. Sclerae anicteric. BiPAP in place. Cardiovascular: Tachy, Mariel Sleet, no M/R/G.  Lungs: Respirations even and unlabored.  CTA bilaterally, No W/R/R. Abdomen: BS x 4, soft, NT/ND.  Musculoskeletal: No gross deformities, no edema.  Skin: Intact, warm, no rashes.   Assessment & Plan:   Sepsis - remains febrile. Cultures negative so far. 1 of 2 BCx with CoNS which likely represents contaminant. May be related to his DVT or possible aspiration. His cardiac hardware and persistent pleural effusion may also represent a source of infection. ; though, echo was unrevealing and favor infiltrate on CXR versus significant effusion. - Continue Zosyn - F/u final cultures  Atrial fibrillation with rapid ventricular response - may be driven in setting of infection.  Cards following, difficult to manage thus far and now off dig and amio. Cardene unfavorable given low EF of 20%. AoC dCHF. - Appreciate cardiology assistance with management - Continue diltiazem, metoprolol per cards  Extensive DVT  -Status post IVC filter placement in setting of recent hemorrhagic stroke.  Cleared by neuro to start anticoagulation. - Continue heparin  History of MCA hemorrhagic stroke with residual facial droop, left-sided hemiparesis and dysphagia.  Repeat head CT stable. - Passed swallow evaluation, continue dysphagia 1 diet  Acute hypoxic respiratory failure - improving - Wean supplemental oxygen for goal SpO2 88-95% (currently on room air) - Continue bronchodilator treatments - Antibiotics as noted   Probable RLL PNA - ? Aspiration. - Continue zosyn for now  Right pleural effusion. - Doesn't appear very large on CXR, but ill assess under [place] today whether there is a clear / safe pocket for [place] to tap.  History of hepatitis C - Outpatient follow-up   Best practice:  Diet: Dysphagia 1 Pain/Anxiety/Delirium protocol (if indicated): Morphine as needed VAP protocol (if indicated): Not applicable DVT prophylaxis: heparin gtt GI prophylaxis: Not applicable Glucose control: Monitor glucose Mobility: Bedrest Code Status: Full code Family Communication: Will call Disposition: Transfer to SDU.  Will ask [facility] to assume care in AM 10/21.   Montey Hora, [place] [facility] Medicine Pager: [phone].  If no answer, [phone] 12/09/2018, 8:24 AM  Attending Note:  68 year old male with a-fib with RVR presenting with pulmonary edema and respiratory distress.  Overnight, continues to require BiPAP occasionally and remains on a diltiazem drip.  On exam, bibasilar crackles noted.  I reviewed CXR myself, pulmonary edema noted.  Discussed with PCCM-NP.  Will attempt of BiPAP today and attempt to stabilize the diltiazem dose and re-evaluate in the afternoon.  If stable will transfer to SDU and to [facility] with PCCM off assuming stabilizes throughout the day.  Will give 2 extra doses of lasix today.  Continue abx for now.  The patient is critically ill with multiple organ  systems failure and requires high complexity decision making for assessment and support, frequent evaluation and titration of therapies, application of advanced monitoring technologies and extensive interpretation of multiple databases.   Critical Care Time devoted to patient care services described in this note is  32  Minutes. This time reflects time of care of this signee Dr Jennet Maduro. This critical care time does not reflect procedure time, or teaching time or supervisory time of PA/NP/Med student/Med Resident etc but could involve care discussion time.  Rush Farmer, M.D. [facility] Pulmonary/Critical Care Medicine. Pager: [phone]. After hours pager: [phone].

## 2018-12-09 NOTE — Progress Notes (Signed)
Stable from a respiratory and hemodynamic standpoint, ok to transfer to SDU and to [facility] service with PCCM off 10/21.  Rush Farmer, M.D. [facility] Pulmonary/Critical Care Medicine. Pager: [phone]. After hours pager: [phone].

## 2018-12-09 NOTE — Progress Notes (Signed)
Progress Note  Patient Name: Glenn [name] Date of Encounter: 12/09/2018  Primary Cardiologist: Virl Axe, MD   Subjective   Feeling OK.  Denies palpitations or shortness of breath.  Complains of pain in L hip.  Inpatient Medications    Scheduled Meds: . chlorhexidine  15 mL Mouth Rinse BID  . Chlorhexidine Gluconate Cloth  6 each Topical Daily  . gabapentin  300 mg Oral Q8H  . ipratropium  0.5 mg Nebulization Q6H  . levalbuterol  0.63 mg Nebulization Q6H  . mouth rinse  15 mL Mouth Rinse q12n4p  . metoprolol tartrate  50 mg Oral Q6H   Continuous Infusions: . diltiazem (CARDIZEM) infusion 15 mg/hr (12/09/18 0600)  . heparin 1,850 Units/hr (12/09/18 0837)  . piperacillin-tazobactam (ZOSYN)  IV 12.5 mL/hr at 12/09/18 0600   PRN Meds: acetaminophen, bisacodyl, calcium carbonate, hydrALAZINE, metoprolol tartrate, morphine injection   Vital Signs    Vitals:   12/09/18 0600 12/09/18 0700 12/09/18 0736 12/09/18 0800  BP: (!) 128/55 128/61    Pulse: 95 (!) 103    Resp: (!) 35 (!) 36    Temp:    99.5 F (37.5 C)  TempSrc:    Oral  SpO2: 94% 95% 94%   Weight:      Height:        Intake/Output Summary (Last 24 hours) at 12/09/2018 0841 Last data filed at 12/09/2018 0800 Gross per 24 hour  Intake 771.74 ml  Output 570 ml  Net 201.74 ml   Last 3 Weights 12/09/2018 12/08/2018 12/07/2018  Weight (lbs) 188 lb 7.9 oz 191 lb 9.3 oz 188 lb 0.8 oz  Weight (kg) 85.5 kg 86.9 kg 85.3 kg      Telemetry    Atrial fibrillation.  Rate 80s-110s.  - Personally Reviewed  ECG    n/a - Personally Reviewed  Physical Exam   VS:  BP 128/61   Pulse (!) 103   Temp 99.5 F (37.5 C) (Oral)   Resp (!) 36   Ht 5\' 7"  (1.702 m)   Wt 85.5 kg   SpO2 94%   BMI 29.52 kg/m  , BMI Body mass index is 29.52 kg/m. GENERAL: Ill -appearing HEENT: Pupils equal round and reactive, fundi not visualized, oral mucosa unremarkable.  R facial droop NECK: No jugular venous distention,  waveform within normal limits, carotid upstroke brisk and symmetric, no bruits LUNGS:  Clear to auscultation bilaterally HEART: Irregularly irregular.  PMI not displaced or sustained,S1 and S2 within normal limits, no S3, no S4, no clicks, no rubs, no murmurs ABD:  Flat, positive bowel sounds normal in frequency in pitch, no bruits, no rebound, no guarding, no midline pulsatile mass, no hepatomegaly, no splenomegaly EXT:  2 plus pulses throughout, 2+ L LE edema, 1+ R LE edema,  Bilateral UE edema.  No cyanosis no clubbing SKIN:  No rashes no nodules NEURO:  L hemiparesis. PSYCH:  Cognitively intact, oriented to person place and time   Labs    High Sensitivity Troponin:   Recent Labs  Lab 12/02/18 2209 12/03/18 0225  TROPONINIHS 26* 25*      Chemistry Recent Labs  Lab 12/02/18 1528  12/02/18 2209  12/06/18 0647 12/07/18 0157 12/09/18 0324  NA 137   < > 136   < > 139 139 135  K 4.9   < > 6.1*   < > 4.1 4.4 4.3  CL 102  --  103   < > 104 103 95*  CO2 23  --  22   < > 23 24 24   GLUCOSE 149*  --  136*   < > 109* 140* 114*  BUN 15  --  17   < > 26* 25* 35*  CREATININE 1.39*  --  1.66*   < > 1.29* 1.12 1.45*  CALCIUM 9.2  --  8.5*   < > 8.6* 8.3* 8.5*  PROT 7.4  --  6.5  --   --   --   --   ALBUMIN 3.1*  --  2.7*  --   --   --   --   AST 25  --  26  --   --   --   --   ALT 16  --  16  --   --   --   --   ALKPHOS 68  --  61  --   --   --   --   BILITOT 0.8  --  0.8  --   --   --   --   GFRNONAA 52*  --  42*   < > 57* >60 50*  GFRAA >60  --  49*   < > >60 >60 58*  ANIONGAP 12  --  11   < > 12 12 16*   < > = values in this interval not displayed.     Hematology Recent Labs  Lab 12/05/18 1029 12/08/18 0503 12/09/18 0324  WBC 15.9* 18.3* 21.4*  RBC 3.53* 3.65* 3.27*  HGB 11.3* 11.4* 10.1*  HCT 33.9* 34.5* 30.5*  MCV 96.0 94.5 93.3  MCH 32.0 31.2 30.9  MCHC 33.3 33.0 33.1  RDW 12.5 13.0 13.5  PLT 213 352 339    BNP Recent Labs  Lab 12/02/18 2032 12/05/18 1029   BNP 566.2* 450.0*     DDimer No results for input(s): DDIMER in the last 168 hours.   Radiology    Ct Head Wo Contrast  Result Date: 12/07/2018 CLINICAL DATA:  Stroke, follow-up.  Recent hemorrhagic CVA. EXAM: CT HEAD WITHOUT CONTRAST TECHNIQUE: Contiguous axial images were obtained from the base of the skull through the vertex without intravenous contrast. COMPARISON:  CT head 11/11/2018 MR head 11/12/2018 FINDINGS: Brain: The right frontal MCA territory infarct is again seen. There is evolution of the hemorrhage previously noted in the right basal ganglia. Fluid levels are no longer present in the lateral ventricles. Mild prominence of the lateral ventricles is likely due to volume loss. Cortical hyperdensity within the infarct territory likely represents cortical laminar necrosis rather than hemorrhage. No new hemorrhage is present. The left hemisphere is within normal limits. The brainstem and cerebellum are within normal limits. Vascular: Atherosclerotic calcifications are present in the cavernous internal carotid arteries bilaterally. There is no hyperdense vessel. Skull: Calvarium is intact. No focal lytic or blastic lesions are present. Sinuses/Orbits: The paranasal sinuses and mastoid air cells are clear. The globes and orbits are within normal limits. IMPRESSION: 1. Expected evolution of basal ganglia hemorrhage. No hyperdense blood products remain. No new hemorrhage is present. 2. Resolution of intraventricular hemorrhage. 3. Prominent ventricular size likely reflects volume loss of the adjacent parenchyma. 4. Expected evolution of prominent anterior right MCA territory infarct with volume loss. 5. Cortical hyperdensity in the infarct territory likely reflects developing cortical laminar necrosis. No acute hemorrhage is present. Electronically Signed   By: San Morelle M.D.   On: 12/07/2018 17:53   Dg Chest Port 1 View  Result Date: 12/09/2018 CLINICAL DATA:  Respiratory failure.  EXAM: PORTABLE CHEST 1 VIEW COMPARISON:  12/06/2018. FINDINGS: Interim removal of feeding tube. AICD in stable position. Stable cardiomegaly. Persistent right base infiltrate and right pleural effusion. No interim change. No pneumothorax. IMPRESSION: 1.  Interim removal of feeding tube. 2.  AICD in stable position.  Stable cardiomegaly. 3. Persistent right base infiltrate and right-sided pleural effusion. No interim change. Electronically Signed   By: Marcello Moores  Register   On: 12/09/2018 07:00    Cardiac Studies   Echo 12/07/18: IMPRESSIONS    1. Left ventricular ejection fraction, by visual estimation, is 20%. The left ventricle has severely decreased function. Mildly increased left ventricular size. There is mildly increased left ventricular hypertrophy.  2. Left ventricular diastolic Doppler parameters are indeterminate in the setting of rapid atrial fibrillation.  3. Global right ventricle has mildly reduced systolic function.The right ventricular size is normal. No increase in right ventricular wall thickness.  4. Left atrial size was mild-moderately dilated.  5. Right atrial size was normal.  6. Trivial pericardial effusion is present.  7. The pericardial effusion is posterior to the left ventricle.  8. Mild aortic valve annular calcification.  9. The mitral valve is grossly normal. Mild mitral valve regurgitation. 10. The tricuspid valve is grossly normal. Tricuspid valve regurgitation mild-moderate. 11. The aortic valve is tricuspid Aortic valve regurgitation is mild to moderate by color flow Doppler. 12. The pulmonic valve was grossly normal. Pulmonic valve regurgitation is mild by color flow Doppler. 13. Moderately elevated pulmonary artery systolic pressure. 14. The inferior vena cava is normal in size with greater than 50% respiratory variability, suggesting right atrial pressure of 3 mmHg. 15. A device wire is visualized. 16. The tricuspid regurgitant velocity is 3.68 m/s, and  with an assumed right atrial pressure of 3 mmHg, the estimated right ventricular systolic pressure is moderately elevated at 57.2 mmHg. 17. No obvious vegetations involving valves or device wire.   Patient Profile     68 y.o. male with nonischemic cardiomyopathy status post ICD, consistent atrial fibrillation, left lower extremity DVT status post IVC filter, recent hemorrhagic stroke, CKD III, admitted with persistent fever and respiratory failure thought to be due to pneumonia.  Cardiology has been assisting with atrial fibrillation with rapid ventricular response.  Assessment & Plan    # Atrial fibrillation with rapid ventricular response: Mr. Philip remains in atrial fibrillation.  Rates are better-controlled.  His heart rate did not improve on amiodarone or digoxin in the past.  Switch metoprolol to metoprolol succinate 200 mg daily.  We will try to load him with digoxin again to see if this allows [place] to reduce the diltiazem dose.  He is on heparin.  He had a recent hemorrhagic stroke.  Plan to initiate Eliquis once cleared by neurosurgery.  # Chronic systolic and diastolic heart failure: LVEF 15-20%.  S/p ICD.  He has LE edema 2/2 DVT.  Continue metoprolol and switch to to succinate once dose is stable.  His home Delene Loll has been held in the setting of renal dysfunction.  Once this is stable would favor adding back Entresto instead of hydralazine.   # Sepsis: Source unclear.  Treated empirically for aspirination pneumonia.  No clear micro data.  Thoracentesis fluid pending.  No evidence of hardware infection on TTE.    # DVT: S/p IVC filter. Currently on heparin and will switch to Eliquis when able.   Time spent: 30 minutes-Greater than 50% of this time was spent in counseling, explanation of diagnosis, planning of further management,  and coordination of care.      For questions or updates, please contact [place] Please consult [email or web address] for contact info under         Signed, Skeet Latch, MD  12/09/2018, 8:41 AM

## 2018-12-09 NOTE — Progress Notes (Signed)
Nutrition Follow-up  RD working remotely.  DOCUMENTATION CODES:   Not applicable  INTERVENTION:   -Ensure Enlive po TID, each supplement provides 350 kcal and 20 grams of protein -MVI with minerals daily -Magic cup TID with meals, each supplement provides 290 kcal and 9 grams of protein  NUTRITION DIAGNOSIS:   Inadequate oral intake related to inability to eat as evidenced by NPO status.  Progressing; advanced to dysphagia 1 diet with thin liquids  GOAL:   Patient will meet greater than or equal to 90% of their needs  Progressing   MONITOR:   Diet advancement, Labs, Weight trends, TF tolerance, Skin, I & O's  REASON FOR ASSESSMENT:   Consult Enteral/tube feeding initiation and management  ASSESSMENT:   68 year old male who transferred from CIR on 10/13 with worsening respiratory distress. PMH significant for HTN, HLD, atrial fibrillation, CHF, s/p ICD and cardioversion 10/29/18, hepatitis C, CKD stage III. Pt was recently admitted on 9/21 with left hemiparesis and found to have large MCA subacute hemorrhage. After transfer to acute care, CT showing small right pleural effusion, gallstones with no evidence of cholecystitis. Pt transferred to the ICU.  10/15- s/p IVC filter insertion 10/16- cortrak placed (tip of tube in stomach), TF initiated 10/17- s/p BSE- advanced to dysphagia 1 diet with thin liquids 10/18- cortrak d/c, TF d/c  Reviewed I/O's: +194 ml x 24 hours and +1.1 L since admission  UOP: 570 ml x 24 hours  Per chart review, pt not very conversant- replies "no" quietly to most questions. Pt tolerating current diet well (per SLP, pt was on a dysphagia 1 diet with thin liquids prior to hospital admission). Meal completion poor: PO 25%. Pt would greatly benefit from addition of nutrition supplements, especially since he no longer has supplemental TF support.   Labs reviewed: CBGS: 133.   Diet Order:   Diet Order            DIET - DYS 1 Room service  appropriate? Yes; Fluid consistency: Thin  Diet effective now              EDUCATION NEEDS:   Not appropriate for education at this time  Skin:  Skin Assessment: Skin Integrity Issues: Skin Integrity Issues:: Other (Comment), Incisions Incisions: closed rt neck Other: MASD bilateral groin  Last BM:  12/07/18  Height:   Ht Readings from Last 1 Encounters:  12/08/18 5\' 7"  (1.702 m)    Weight:   Wt Readings from Last 1 Encounters:  12/09/18 85.5 kg    Ideal Body Weight:  67.3 kg  BMI:  Body mass index is 29.52 kg/m.  Estimated Nutritional Needs:   Kcal:  2000-2200  Protein:  90-105 grams  Fluid:  >/= 2.0 L    Hiral Lukasiewicz A. Jimmye Norman, RD, LDN, [place] Registered Dietitian II Certified Diabetes Care and Education Specialist Pager: [phone] After hours Pager: [phone]

## 2018-12-09 NOTE — Progress Notes (Signed)
[place] for heparin Indication: atrial fibrillation, extensive DVT, high-probability PE  Allergies  Allergen Reactions  . Benadryl [Diphenhydramine] Palpitations    Patient Measurements: Height: 5\' 7"  (170.2 cm) Weight: 188 lb 7.9 oz (85.5 kg) IBW/kg (Calculated) : 66.1 Heparin Dosing Weight: 85kg  Vital Signs: Temp: 99.5 F (37.5 C) (10/20 0800) Temp Source: Oral (10/20 0800) BP: 128/61 (10/20 0700) Pulse Rate: 103 (10/20 0700)  Labs: Recent Labs    12/07/18 0157  12/08/18 0503 12/08/18 1208 12/08/18 2018 12/09/18 0324  HGB  --   --  11.4*  --   --  10.1*  HCT  --   --  34.5*  --   --  30.5*  PLT  --   --  352  --   --  339  HEPARINUNFRC  --    < > <0.10* <0.10* <0.10* 0.14*  CREATININE 1.12  --   --   --   --  1.45*   < > = values in this interval not displayed.    Estimated Creatinine Clearance: 52.4 mL/min (A) (by C-G formula based on SCr of 1.45 mg/dL (H)).   Medical History: Past Medical History:  Diagnosis Date  . Atrial fibrillation ([place])   . CHF (congestive heart failure) ([place])   . Hepatitis C   . Hypertension   . Stroke ([place])   . Visit for monitoring Tikosyn therapy 03/26/2017     Assessment: 84 yoM with rapid AFib and DVT with hx recent hemorrhagic stroke. Pharmacy cleared to start IV heparin (low goal no bolus) by CCM and neurology with stable head CT on 10/19.  Heparin level increased but remains subtherapeutic after rate increase. Hg down to 10.1, plt wnl. No bleeding or issues with infusion per discussion with RN.  Goal of Therapy:  Heparin level 0.3-0.5 units/ml Monitor platelets by anticoagulation protocol: Yes   Plan:  -No bolus. Increase heparin to 1850 units/hr -6h heparin level -Monitor daily heparin level and CBC, s/sx bleeding  Elicia Lamp, PharmD, BCPS Please check AMION for all [place] contact numbers Clinical Pharmacist 12/09/2018 8:33 AM

## 2018-12-09 NOTE — Progress Notes (Signed)
  ANTICOAGULATION CONSULT NOTE - Follow Up Consult  Pharmacy Consult for heparin Indication: atrial fibrillation, extensive DVT, high-probability PE  Allergies  Allergen Reactions  . Benadryl [Diphenhydramine] Palpitations    Patient Measurements: Height: 5\' 7"  (170.2 cm) Weight: 188 lb 7.9 oz (85.5 kg) IBW/kg (Calculated) : 66.1 Heparin Dosing Weight: 85kg  Vital Signs: Temp: 99.3 F (37.4 C) (10/20 2018) Temp Source: Oral (10/20 2018) BP: 107/50 (10/20 2100) Pulse Rate: 85 (10/20 2100)  Labs: Recent Labs    12/07/18 0157 12/08/18 0503  12/09/18 0324 12/09/18 1448 12/09/18 2235  HGB  --  11.4*  --  10.1*  --   --   HCT  --  34.5*  --  30.5*  --   --   PLT  --  352  --  339  --   --   HEPARINUNFRC  --  <0.10*   < > 0.14* 0.20* 0.29*  CREATININE 1.12  --   --  1.45*  --   --    < > = values in this interval not displayed.    Estimated Creatinine Clearance: 52.4 mL/min (A) (by C-G formula based on SCr of 1.45 mg/dL (H)).  Assessment: 45 yoM with rapid AFib and DVT with hx recent hemorrhagic stroke. Pharmacy cleared to start IV heparin (low goal no bolus) by CCM and neurology with stable head CT on 10/19.  Heparin level now 0.29 units/ml Goal of Therapy:  Heparin level 0.3-0.5 units/ml Monitor platelets by anticoagulation protocol: Yes   Plan:  -Continue heparin at 2000 units/hr -Monitor daily heparin level and CBC, s/sx bleeding  Thanks for allowing pharmacy to be a part of this patient's care.  Excell Seltzer, PharmD Clinical Pharmacist

## 2018-12-09 NOTE — Progress Notes (Signed)
  ANTICOAGULATION CONSULT NOTE - Follow Up Consult  Pharmacy Consult for heparin Indication: atrial fibrillation, extensive DVT, high-probability PE  Allergies  Allergen Reactions  . Benadryl [Diphenhydramine] Palpitations    Patient Measurements: Height: 5\' 7"  (170.2 cm) Weight: 188 lb 7.9 oz (85.5 kg) IBW/kg (Calculated) : 66.1 Heparin Dosing Weight: 85kg  Vital Signs: Temp: 98.5 F (36.9 C) (10/20 1307) Temp Source: Oral (10/20 1307) BP: 128/53 (10/20 1500) Pulse Rate: 27 (10/20 1400)  Labs: Recent Labs    12/07/18 0157 12/08/18 0503  12/08/18 2018 12/09/18 0324 12/09/18 1448  HGB  --  11.4*  --   --  10.1*  --   HCT  --  34.5*  --   --  30.5*  --   PLT  --  352  --   --  339  --   HEPARINUNFRC  --  <0.10*   < > <0.10* 0.14* 0.20*  CREATININE 1.12  --   --   --  1.45*  --    < > = values in this interval not displayed.    Estimated Creatinine Clearance: 52.4 mL/min (A) (by C-G formula based on SCr of 1.45 mg/dL (H)).  Assessment: 18 yoM with rapid AFib and DVT with hx recent hemorrhagic stroke. Pharmacy cleared to start IV heparin (low goal no bolus) by CCM and neurology with stable head CT on 10/19.  Heparin level trending up but remains subtherapeutic after being on heparin for >40 hours with sequential rate increases.  Hg 11.4> 10.1, plt wnl. No bleeding or issues with infusion per RN.  Goal of Therapy:  Heparin level 0.3-0.5 units/ml Monitor platelets by anticoagulation protocol: Yes   Plan:  -No bolus. Increase heparin to 2000 units/hr -6h heparin level at 22:30  -Monitor daily heparin level and CBC, s/sx bleeding  Benetta Spar, PharmD, BCPS, [facility] Clinical Pharmacist

## 2018-12-10 DIAGNOSIS — J90 Pleural effusion, not elsewhere classified: Secondary | ICD-10-CM

## 2018-12-10 DIAGNOSIS — R1312 Dysphagia, oropharyngeal phase: Secondary | ICD-10-CM

## 2018-12-10 DIAGNOSIS — I5023 Acute on chronic systolic (congestive) heart failure: Secondary | ICD-10-CM

## 2018-12-10 DIAGNOSIS — I69359 Hemiplegia and hemiparesis following cerebral infarction affecting unspecified side: Secondary | ICD-10-CM

## 2018-12-10 DIAGNOSIS — R6520 Severe sepsis without septic shock: Secondary | ICD-10-CM

## 2018-12-10 DIAGNOSIS — N179 Acute kidney failure, unspecified: Secondary | ICD-10-CM

## 2018-12-10 LAB — CBC
HCT: 32.2 % — ABNORMAL LOW (ref 39.0–52.0)
HCT: 32.3 % — ABNORMAL LOW (ref 39.0–52.0)
Hemoglobin: 10.7 g/dL — ABNORMAL LOW (ref 13.0–17.0)
Hemoglobin: 10.8 g/dL — ABNORMAL LOW (ref 13.0–17.0)
MCH: 31.2 pg (ref 26.0–34.0)
MCH: 31.7 pg (ref 26.0–34.0)
MCHC: 33.2 g/dL (ref 30.0–36.0)
MCHC: 33.4 g/dL (ref 30.0–36.0)
MCV: 93.9 fL (ref 80.0–100.0)
MCV: 94.7 fL (ref 80.0–100.0)
Platelets: 327 10*3/uL (ref 150–400)
Platelets: 337 10*3/uL (ref 150–400)
RBC: 3.43 MIL/uL — ABNORMAL LOW (ref 4.22–5.81)
RBC: 3.41 MIL/uL — ABNORMAL LOW (ref 4.22–5.81)
RDW: 13.5 % (ref 11.5–15.5)
RDW: 13.6 % (ref 11.5–15.5)
WBC: 14.5 10*3/uL — ABNORMAL HIGH (ref 4.0–10.5)
WBC: 14.7 10*3/uL — ABNORMAL HIGH (ref 4.0–10.5)
nRBC: 0 % (ref 0.0–0.2)
nRBC: 0.1 % (ref 0.0–0.2)

## 2018-12-10 LAB — BASIC METABOLIC PANEL
Anion gap: 14 (ref 5–15)
BUN: 39 mg/dL — ABNORMAL HIGH (ref 8–23)
CO2: 27 mmol/L (ref 22–32)
Calcium: 8.7 mg/dL — ABNORMAL LOW (ref 8.9–10.3)
Chloride: 95 mmol/L — ABNORMAL LOW (ref 98–111)
Creatinine, Ser: 1.36 mg/dL — ABNORMAL HIGH (ref 0.61–1.24)
GFR calc Af Amer: >60 mL/min (ref >60)
GFR calc non Af Amer: 54 mL/min — ABNORMAL LOW (ref >60)
Glucose, Bld: 115 mg/dL — ABNORMAL HIGH (ref 70–99)
Potassium: 3.7 mmol/L (ref 3.5–5.1)
Sodium: 136 mmol/L (ref 135–145)

## 2018-12-10 LAB — GLUCOSE, CAPILLARY
Glucose-Capillary: 107 mg/dL — ABNORMAL HIGH (ref 70–99)
Glucose-Capillary: 109 mg/dL — ABNORMAL HIGH (ref 70–99)
Glucose-Capillary: 118 mg/dL — ABNORMAL HIGH (ref 70–99)
Glucose-Capillary: 108 mg/dL — ABNORMAL HIGH (ref 70–99)

## 2018-12-10 LAB — CULTURE, BLOOD (ROUTINE X 2)
Culture: NO GROWTH
Special Requests: ADEQUATE

## 2018-12-10 LAB — HEPARIN LEVEL (UNFRACTIONATED)
Heparin Unfractionated: 0.42 IU/mL (ref 0.30–0.70)
Heparin Unfractionated: 0.39 IU/mL (ref 0.30–0.70)

## 2018-12-10 LAB — DIFFERENTIAL
Abs Immature Granulocytes: 0.24 10*3/uL — ABNORMAL HIGH (ref 0.00–0.07)
Basophils Absolute: 0.1 10*3/uL (ref 0.0–0.1)
Basophils Relative: 0 %
Eosinophils Absolute: 0.7 10*3/uL — ABNORMAL HIGH (ref 0.0–0.5)
Eosinophils Relative: 5 %
Immature Granulocytes: 2 %
Lymphocytes Relative: 16 %
Lymphs Abs: 2.3 10*3/uL (ref 0.7–4.0)
Monocytes Absolute: 1 10*3/uL (ref 0.1–1.0)
Monocytes Relative: 7 %
Neutro Abs: 10.3 10*3/uL — ABNORMAL HIGH (ref 1.7–7.7)
Neutrophils Relative %: 70 %

## 2018-12-10 LAB — MAGNESIUM: Magnesium: 2.6 mg/dL — ABNORMAL HIGH (ref 1.7–2.4)

## 2018-12-10 LAB — PHOSPHORUS: Phosphorus: 3.7 mg/dL (ref 2.5–4.6)

## 2018-12-10 MED ORDER — IPRATROPIUM BROMIDE 0.02 % IN SOLN
0.5000 mg | Freq: Two times a day (BID) | RESPIRATORY_TRACT | Status: DC
  Administered 2018-12-10 – 2018-12-13 (×7): 0.5 mg via RESPIRATORY_TRACT
  Filled 2018-12-10 (×7): qty 2.5

## 2018-12-10 MED ORDER — LEVALBUTEROL HCL 0.63 MG/3ML IN NEBU
0.6300 mg | INHALATION_SOLUTION | Freq: Two times a day (BID) | RESPIRATORY_TRACT | Status: DC
  Administered 2018-12-10 – 2018-12-13 (×7): 0.63 mg via RESPIRATORY_TRACT
  Filled 2018-12-10 (×7): qty 3

## 2018-12-10 MED ORDER — SODIUM CHLORIDE 0.9 % IV SOLN
INTRAVENOUS | Status: DC | PRN
  Administered 2018-12-10: 05:00:00 via INTRAVENOUS

## 2018-12-10 MED ORDER — DIGOXIN 125 MCG PO TABS
0.1250 mg | ORAL_TABLET | Freq: Once | ORAL | Status: AC
  Administered 2018-12-10: 0.125 mg via ORAL
  Filled 2018-12-10: qty 1

## 2018-12-10 MED ORDER — FUROSEMIDE 10 MG/ML IJ SOLN
40.0000 mg | Freq: Every day | INTRAMUSCULAR | Status: DC
  Administered 2018-12-10 – 2018-12-11 (×2): 40 mg via INTRAVENOUS
  Filled 2018-12-10 (×2): qty 4

## 2018-12-10 MED ORDER — DIGOXIN 250 MCG PO TABS
0.2500 mg | ORAL_TABLET | Freq: Every day | ORAL | Status: DC
  Administered 2018-12-11 – 2018-12-12 (×2): 0.25 mg via ORAL
  Filled 2018-12-10 (×2): qty 1

## 2018-12-10 NOTE — Progress Notes (Signed)
[place] for heparin Indication: atrial fibrillation, extensive DVT, high-probability PE  Allergies  Allergen Reactions  . Benadryl [Diphenhydramine] Palpitations    Patient Measurements: Height: 5\' 7"  (170.2 cm) Weight: 185 lb 13.6 oz (84.3 kg) IBW/kg (Calculated) : 66.1 Heparin Dosing Weight: 85kg  Vital Signs: Temp: 98.4 F (36.9 C) (10/21 1100) Temp Source: Oral (10/21 1100) BP: 118/62 (10/21 1400) Pulse Rate: 70 (10/21 1400)  Labs: Recent Labs    12/08/18 0503  12/09/18 0324  12/09/18 2235 12/10/18 0739 12/10/18 1411  HGB 11.4*  --  10.1*  --   --  10.7*  --   HCT 34.5*  --  30.5*  --   --  32.2*  --   PLT 352  --  339  --   --  327  --   HEPARINUNFRC <0.10*   < > 0.14*   < > 0.29* 0.42 0.39  CREATININE  --   --  1.45*  --   --  1.36*  --    < > = values in this interval not displayed.    Estimated Creatinine Clearance: 55.5 mL/min (A) (by C-G formula based on SCr of 1.36 mg/dL (H)).   Medical History: Past Medical History:  Diagnosis Date  . Atrial fibrillation ([place])   . CHF (congestive heart failure) ([place])   . Hepatitis C   . Hypertension   . Stroke ([place])   . Visit for monitoring Tikosyn therapy 03/26/2017     Assessment: 24 yoM with rapid AFib and DVT with hx recent hemorrhagic stroke. Pharmacy cleared to start IV heparin (low goal no bolus) by CCM and neurology with stable head CT on 10/19.  Heparin level remains therapeutic. CBC stable. No bleeding or issues with infusion reported.  Goal of Therapy:  Heparin level 0.3-0.5 units/ml Monitor platelets by anticoagulation protocol: Yes   Plan:  -Continue heparin at 2000 units/hr -Monitor daily heparin level and CBC, s/sx bleeding  Elicia Lamp, PharmD, BCPS Please check AMION for all [facility] contact numbers Clinical Pharmacist 12/10/2018 3:13 PM

## 2018-12-10 NOTE — Progress Notes (Signed)
PROGRESS NOTE  Glenn [name] [id number] DOB: January 20, 1951   PCP: [name], MD  Patient is from: CIR. lives with friends before hospitalization.  DOA: 12/02/2018 LOS: 8  Brief Narrative / Interim history: 68 year old male with history of HTN, HLD, A. fib previously on Eliquis, EF 15-20% s/p ICD and cardioversion 10/29/2018, Hep C, CKD-III, recently hospitalized 9/21-10/13 for right MCA subacute hemorrhagic CVA and discharged to CIR. He returns the same date with A. fib with RVR and abdominal pain. Admitted to ICU for A. fib with RVR, sepsis likely due to aspiration pneumonia, AoC CHF, extensive DVT with IVC placement.  He is nowon room air. Still full code.  Cardiology following.  TRH reassumed care on 12/10/2018.  Subjective: No major events overnight of this morning.  Complains nausea and abdominal pain.  Denies headache, chest pain or shortness of breath  Objective: Vitals:   12/10/18 0800 12/10/18 0900 12/10/18 0905 12/10/18 1000  BP: (!) 145/80 (!) 152/115 (!) 150/76 134/79  Pulse: (!) 110 62 (!) 57 (!) 43  Resp: (!) 26 (!) 28 (!) 29 (!) 27  Temp: 98.4 F (36.9 C)     TempSrc: Oral     SpO2: 97% 99% 98% 97%  Weight:      Height:        Intake/Output Summary (Last 24 hours) at 12/10/2018 1140 Last data filed at 12/10/2018 1000 Gross per 24 hour  Intake 1200.4 ml  Output 1370 ml  Net -169.6 ml   Filed Weights   12/08/18 0423 12/09/18 0500 12/10/18 0500  Weight: 86.9 kg 85.5 kg 84.3 kg    Examination:  GENERAL: Chronically ill-appearing.  No acute distress. HEENT: MMM.  Vision and hearing grossly intact.  NECK: Supple.  No apparent JVD.  RESP:  No IWOB.  Fair air movement bilaterally.  Rhonchi. CVS:  RRR. Heart sounds normal.  ABD/GI/GU: Bowel sounds present. Soft.  Diffuse tenderness to palpation.  No rebound or guarding. MSK/EXT:  No apparent deformity.  L LE edema.  Left hemiparesis. SKIN: no apparent skin lesion or wound NEURO: Awake, alert  and oriented self, place, month and the year.  Left hemiparesis. PSYCH: Calm. Normal affect.   Assessment & Plan: Sepsis due to aspiration/RLL pneumonia: CXR and CT abdomen with small right pleural effusion, atelectasis and possible infiltrate.  No significant intra-abdominal finding on CT abdomen and pelvis on 10/13.  Blood culture 10/16 with coag negative staph in 1 out of 2 bottles likely contaminant.  MRSA PCR negative.  Last fever 101.6 on 10/19.  Leukocytosis improving. -10/8 ceftriaxone completed -10/13 zosyn >> 10/22 -10/13 vanc > 10/18 -Obtain CT chest without contrast.  -Discussed with infectious disease, Dr. Graylon Good who suggested observing off antibiotics starting 9/22 if no significant finding on CT chest.  A. fib with RVR: Heart rate in 90s.  Cardiology managing. -On digoxin, Cardizem, high-dose metoprolol with as needed -On heparin for anticoagulation  Acute on chronic systolic CHF: Echo [id number] with LVEF 15-20%.  ICD status. -IV Lasix per cardiology -Entresto on hold due to AKI. -Monitor fluid status, renal function and electrolytes and renal  Extensive DVT s/p IVC filter -Continue heparin -Eventually transition to Eliquis. -IR to reassess for filter retrieval in about 8 to 12 weeks.  Acute respiratory failure with hypoxia: Multifactorial including sepsis, pneumonia, A. fib with RVR and CHF -Wean supplemental oxygen as above -Continue bronchodilators -Antibiotics as above -CT chest without contrast today. -BiPAP at night  Right pleural effusion: Noted on CXR and CT abdomen and  pelvis -CT chest without contrast -We will consider consulting IR based on CT results.  History of MCA hemorrhagic stroke with residual left hemiparesis/dysphagia: Hospitalized for this 9/21-10/13 and discharged to CIR. Returns with A. fib with RVR and sepsis as above -CT head 10/18-expected evaluation of BG hemorrhage and prominent anterior right MCA infarct.  Resolved lesion of IVH -Closely  monitor while on heparin -PT/OT.  History of hepatitis C -Outpatient follow-up  CKD-3: Stable.  Baseline Cr1.2-1.4.  -Continue monitoring while on diuretics  Dysphagia -Continue to feed per cortrack  Malnutrition Type/Characteristics and interventions: Nutrition Problem: Inadequate oral intake Etiology: inability to eat  Signs/Symptoms: NPO status  Interventions: Tube feeding   DVT prophylaxis: On heparin drip Code Status: Full code Family Communication: Patient states she has no family members.  Lives with friend before hospitalization Disposition Plan: Remains inpatient. Consultants: PCCM, cardiology, ID (over the phone)  Procedures:  None  Microbiology summarized: MRSA PCR negative Exploited sputum not acceptable for culture Blood culture with coag negative staph in 1 out of 2 bottles Previous blood cultures negative.  Sch Meds:  Scheduled Meds: . chlorhexidine  15 mL Mouth Rinse BID  . Chlorhexidine Gluconate Cloth  6 each Topical Daily  . [START ON 12/11/2018] digoxin  0.25 mg Oral Daily  . feeding supplement (ENSURE ENLIVE)  237 mL Oral TID BM  . furosemide  40 mg Intravenous Daily  . gabapentin  300 mg Oral Q8H  . ipratropium  0.5 mg Nebulization Q6H  . levalbuterol  0.63 mg Nebulization Q6H  . mouth rinse  15 mL Mouth Rinse q12n4p  . metoprolol succinate  200 mg Oral Daily  . multivitamin with minerals  1 tablet Oral Daily   Continuous Infusions: . sodium chloride 10 mL/hr at 12/10/18 1000  . diltiazem (CARDIZEM) infusion 5 mg/hr (12/10/18 1000)  . heparin 2,000 Units/hr (12/10/18 1000)  . piperacillin-tazobactam (ZOSYN)  IV Stopped (12/10/18 0941)   PRN Meds:.sodium chloride, acetaminophen, bisacodyl, calcium carbonate, hydrALAZINE, metoprolol tartrate, morphine injection  Antimicrobials: Anti-infectives (From admission, onward)   Start     Dose/Rate Route Frequency Ordered Stop   12/07/18 0800  vancomycin (VANCOCIN) 1,500 mg in sodium chloride 0.9  % 500 mL IVPB  Status:  Discontinued     1,500 mg 250 mL/hr over 120 Minutes Intravenous Every 24 hours 12/06/18 1206 12/07/18 0931   12/06/18 0330  vancomycin (VANCOCIN) 1,500 mg in sodium chloride 0.9 % 500 mL IVPB     1,500 mg 250 mL/hr over 120 Minutes Intravenous  Once 12/06/18 0323 12/06/18 0557   12/03/18 2200  vancomycin (VANCOCIN) 1,500 mg in sodium chloride 0.9 % 500 mL IVPB  Status:  Discontinued     1,500 mg 250 mL/hr over 120 Minutes Intravenous Every 24 hours 12/02/18 2115 12/03/18 0850   12/03/18 2100  vancomycin (VANCOCIN) 1,250 mg in sodium chloride 0.9 % 250 mL IVPB  Status:  Discontinued     1,250 mg 166.7 mL/hr over 90 Minutes Intravenous Every 24 hours 12/03/18 0850 12/03/18 1009   12/02/18 2100  piperacillin-tazobactam (ZOSYN) IVPB 3.375 g     3.375 g 12.5 mL/hr over 240 Minutes Intravenous Every 8 hours 12/02/18 2040     12/02/18 2045  vancomycin (VANCOCIN) 1,750 mg in sodium chloride 0.9 % 500 mL IVPB     1,750 mg 250 mL/hr over 120 Minutes Intravenous  Once 12/02/18 2040 12/03/18 0100       I have personally reviewed the following labs and images: CBC: Recent Labs  Lab 12/04/18 0333 12/05/18 1029 12/08/18 0503 12/09/18 0324 12/10/18 0739  WBC 17.3* 15.9* 18.3* 21.4* 14.5*  NEUTROABS  --  13.0*  --   --   --   HGB 11.2* 11.3* 11.4* 10.1* 10.7*  HCT 33.5* 33.9* 34.5* 30.5* 32.2*  MCV 96.0 96.0 94.5 93.3 93.9  PLT 214 213 352 339 327   BMP &GFR Recent Labs  Lab 12/05/18 1029 12/05/18 2358 12/06/18 0647 12/07/18 0157 12/09/18 0324 12/10/18 0739  NA 137  --  139 139 135 136  K 3.9  --  4.1 4.4 4.3 3.7  CL 102  --  104 103 95* 95*  CO2 23  --  23 24 24 27   GLUCOSE 122*  --  109* 140* 114* 115*  BUN 19  --  26* 25* 35* 39*  CREATININE 1.33*  --  1.29* 1.12 1.45* 1.36*  CALCIUM 8.4*  --  8.6* 8.3* 8.5* 8.7*  MG  --  2.4  --  2.1 2.2 2.6*  PHOS  --   --   --   --  3.9 3.7   Estimated Creatinine Clearance: 55.5 mL/min (A) (by C-G formula based  on SCr of 1.36 mg/dL (H)). Liver & Pancreas: No results for input(s): AST, ALT, ALKPHOS, BILITOT, PROT, ALBUMIN in the last 168 hours. No results for input(s): LIPASE, AMYLASE in the last 168 hours. No results for input(s): AMMONIA in the last 168 hours. Diabetic: No results for input(s): HGBA1C in the last 72 hours. Recent Labs  Lab 12/09/18 1258 12/09/18 1944 12/09/18 2355 12/10/18 0347 12/10/18 0732  GLUCAP 134* 128* 128* 107* 109*   Cardiac Enzymes: No results for input(s): CKTOTAL, CKMB, CKMBINDEX, TROPONINI in the last 168 hours. No results for input(s): PROBNP in the last 8760 hours. Coagulation Profile: Recent Labs  Lab 12/04/18 0333  INR 1.4*   Thyroid Function Tests: No results for input(s): TSH, T4TOTAL, FREET4, T3FREE, THYROIDAB in the last 72 hours. Lipid Profile: No results for input(s): CHOL, HDL, LDLCALC, TRIG, CHOLHDL, LDLDIRECT in the last 72 hours. Anemia Panel: No results for input(s): VITAMINB12, FOLATE, FERRITIN, TIBC, IRON, RETICCTPCT in the last 72 hours. Urine analysis:    Component Value Date/Time   COLORURINE AMBER (A) 12/05/2018 0227   APPEARANCEUR HAZY (A) 12/05/2018 0227   LABSPEC 1.040 (H) 12/05/2018 0227   PHURINE 5.0 12/05/2018 0227   GLUCOSEU NEGATIVE 12/05/2018 0227   HGBUR SMALL (A) 12/05/2018 0227   BILIRUBINUR NEGATIVE 12/05/2018 0227   KETONESUR NEGATIVE 12/05/2018 0227   PROTEINUR 100 (A) 12/05/2018 0227   NITRITE NEGATIVE 12/05/2018 0227   LEUKOCYTESUR NEGATIVE 12/05/2018 0227   Sepsis Labs: Invalid input(s): PROCALCITONIN, [place]  Microbiology: Recent Results (from the past 240 hour(s))  Culture, blood (routine x 2)     Status: None   Collection Time: 12/02/18  3:28 PM   Specimen: BLOOD RIGHT ARM  Result Value Ref Range Status   Specimen Description BLOOD RIGHT ARM  Final   Special Requests AEROBIC BOTTLE ONLY Blood Culture adequate volume  Final   Culture   Final    NO GROWTH 5 DAYS Performed at [facility], [address], [place], [place] [address]    Report Status 12/07/2018 FINAL  Final  Culture, blood (routine x 2)     Status: None   Collection Time: 12/02/18  3:28 PM   Specimen: BLOOD RIGHT ARM  Result Value Ref Range Status   Specimen Description BLOOD RIGHT ARM  Final   Special  Requests AEROBIC BOTTLE ONLY Blood Culture adequate volume  Final   Culture   Final    NO GROWTH 5 DAYS Performed at [facility], [place] [address], [place], [place] [address]    Report Status 12/07/2018 FINAL  Final  Urine Culture     Status: None   Collection Time: 12/02/18  9:45 PM   Specimen: Urine, Catheterized  Result Value Ref Range Status   Specimen Description URINE, CATHETERIZED  Final   Special Requests NONE  Final   Culture   Final    NO GROWTH Performed at [facility], [place] [address] [place], [place] [address]    Report Status 12/04/2018 FINAL  Final  MRSA PCR Screening     Status: None   Collection Time: 12/02/18  9:54 PM   Specimen: Nasal Mucosa; Nasopharyngeal  Result Value Ref Range Status   MRSA by PCR NEGATIVE NEGATIVE Final    Comment:        The GeneXpert MRSA Assay (FDA approved for NASAL specimens only), is one component of a comprehensive MRSA colonization surveillance program. It is not intended to diagnose MRSA infection nor to guide or monitor treatment for MRSA infections. Performed at [facility], [place] [address], [place], [place] [address]   Culture, blood (routine x 2)     Status: None   Collection Time: 12/05/18  4:06 AM   Specimen: BLOOD LEFT HAND  Result Value Ref Range Status   Specimen Description BLOOD LEFT HAND  Final   Special Requests   Final    BOTTLES DRAWN AEROBIC ONLY Blood Culture adequate volume   Culture   Final    NO GROWTH 5 DAYS Performed at [facility], [place] [address], [place], [place] [address]    Report Status 12/10/2018 FINAL  Final  Culture, blood (routine x 2)     Status: Abnormal   Collection  Time: 12/05/18  4:06 AM   Specimen: BLOOD RIGHT HAND  Result Value Ref Range Status   Specimen Description BLOOD RIGHT HAND  Final   Special Requests   Final    BOTTLES DRAWN AEROBIC ONLY Blood Culture results may not be optimal due to an inadequate volume of blood received in culture bottles   Culture  Setup Time   Final    AEROBIC BOTTLE ONLY GRAM POSITIVE COCCI CRITICAL RESULT CALLED TO, READ BACK BY AND VERIFIED WITH: V BRYK PHARMD 12/06/18 0315 JDW    Culture (A)  Final    STAPHYLOCOCCUS SPECIES (COAGULASE NEGATIVE) THE SIGNIFICANCE OF ISOLATING THIS ORGANISM FROM A SINGLE SET OF BLOOD CULTURES WHEN MULTIPLE SETS ARE DRAWN IS UNCERTAIN. PLEASE NOTIFY THE MICROBIOLOGY DEPARTMENT WITHIN ONE WEEK IF SPECIATION AND SENSITIVITIES ARE REQUIRED. Performed at [facility], [place] [address] [place], [place] [address]    Report Status 12/07/2018 FINAL  Final  Expectorated sputum assessment w rflx to resp cult     Status: None   Collection Time: 12/06/18  9:06 PM   Specimen: SPU  Result Value Ref Range Status   Specimen Description SPUTUM  Final   Special Requests NONE  Final   Sputum evaluation   Final    Sputum specimen not acceptable for testing.  Please recollect.   RESULT CALLED TO, READ BACK BY AND VERIFIED WITH: RN York Cerise CRUZ [id number] FCP Performed at [facility] [address], [place], [place] [address]    Report Status 12/07/2018 FINAL  Final  Radiology Studies: No results found.  45 minutes with more than 50% spent in reviewing records, counseling patient and coordinating care.  Taye T. [place]  If 7PM-7AM, please contact night-coverage [email or web address] Password [email or web address] 12/10/2018, 11:40 AM

## 2018-12-10 NOTE — Progress Notes (Signed)
Pt had 42 beats run of VTach. Pt having a BM when it happened. Pt stated " I'm okay, I don't feel anything." Tylene Fantasia (NP on call) and Dr. Kalman Shan ( cardiology) was updated. No new orders given. Will continue to monitor pt.

## 2018-12-10 NOTE — Progress Notes (Signed)
Progress Note  Patient Name: Glenn [name] Date of Encounter: 12/10/2018  Primary Cardiologist: Virl Axe, MD   Subjective   L leg pain and nausea.  No chest pain or palpitations.  Breathing is OK.   Inpatient Medications    Scheduled Meds: . chlorhexidine  15 mL Mouth Rinse BID  . Chlorhexidine Gluconate Cloth  6 each Topical Daily  . feeding supplement (ENSURE ENLIVE)  237 mL Oral TID BM  . gabapentin  300 mg Oral Q8H  . ipratropium  0.5 mg Nebulization Q6H  . levalbuterol  0.63 mg Nebulization Q6H  . mouth rinse  15 mL Mouth Rinse q12n4p  . metoprolol succinate  200 mg Oral Daily  . multivitamin with minerals  1 tablet Oral Daily   Continuous Infusions: . sodium chloride 10 mL/hr at 12/10/18 1000  . diltiazem (CARDIZEM) infusion 5 mg/hr (12/10/18 1000)  . heparin 2,000 Units/hr (12/10/18 1000)  . piperacillin-tazobactam (ZOSYN)  IV Stopped (12/10/18 0941)   PRN Meds: sodium chloride, acetaminophen, bisacodyl, calcium carbonate, hydrALAZINE, metoprolol tartrate, morphine injection   Vital Signs    Vitals:   12/10/18 0800 12/10/18 0900 12/10/18 0905 12/10/18 1000  BP: (!) 145/80 (!) 152/115 (!) 150/76 134/79  Pulse: (!) 110 62 (!) 57 (!) 43  Resp: (!) 26 (!) 28 (!) 29 (!) 27  Temp: 98.4 F (36.9 C)     TempSrc: Oral     SpO2: 97% 99% 98% 97%  Weight:      Height:        Intake/Output Summary (Last 24 hours) at 12/10/2018 1110 Last data filed at 12/10/2018 1000 Gross per 24 hour  Intake 1200.4 ml  Output 1370 ml  Net -169.6 ml   Last 3 Weights 12/10/2018 12/09/2018 12/08/2018  Weight (lbs) 185 lb 13.6 oz 188 lb 7.9 oz 191 lb 9.3 oz  Weight (kg) 84.3 kg 85.5 kg 86.9 kg      Telemetry    Atrial fibrillation.  Rate 80s-110s.  - Personally Reviewed  ECG    n/a - Personally Reviewed  Physical Exam   VS:  BP 134/79   Pulse (!) 43   Temp 98.4 F (36.9 C) (Oral)   Resp (!) 27   Ht 5\' 7"  (1.702 m)   Wt 84.3 kg   SpO2 97%   BMI 29.11  kg/m  , BMI Body mass index is 29.11 kg/m. GENERAL: Ill -appearing HEENT: Pupils equal round and reactive, fundi not visualized, oral mucosa unremarkable.  R facial droop NECK: + jugular venous distention, waveform within normal limits, carotid upstroke brisk and symmetric, no bruits LUNGS:  Clear to auscultation bilaterally HEART: Irregularly irregular.  PMI not displaced or sustained,S1 and S2 within normal limits, no S3, no S4, no clicks, no rubs, no murmurs ABD:  Flat, positive bowel sounds normal in frequency in pitch, no bruits, no rebound, no guarding, no midline pulsatile mass, no hepatomegaly, no splenomegaly EXT:  2 plus pulses throughout, 2+ L LE edema, no R LE edema,  Bilateral UE edema.  No cyanosis no clubbing SKIN:  No rashes no nodules NEURO:  L hemiparesis. PSYCH:  Cognitively intact, oriented to person place and time   Labs    High Sensitivity Troponin:   Recent Labs  Lab 12/02/18 2209 12/03/18 0225  TROPONINIHS 26* 25*      Chemistry Recent Labs  Lab 12/07/18 0157 12/09/18 0324 12/10/18 0739  NA 139 135 136  K 4.4 4.3 3.7  CL 103 95* 95*  CO2 24 24 27   GLUCOSE 140* 114* 115*  BUN 25* 35* 39*  CREATININE 1.12 1.45* 1.36*  CALCIUM 8.3* 8.5* 8.7*  GFRNONAA >60 50* 54*  GFRAA >60 58* >60  ANIONGAP 12 16* 14     Hematology Recent Labs  Lab 12/08/18 0503 12/09/18 0324 12/10/18 0739  WBC 18.3* 21.4* 14.5*  RBC 3.65* 3.27* 3.43*  HGB 11.4* 10.1* 10.7*  HCT 34.5* 30.5* 32.2*  MCV 94.5 93.3 93.9  MCH 31.2 30.9 31.2  MCHC 33.0 33.1 33.2  RDW 13.0 13.5 13.5  PLT 352 339 327    BNP Recent Labs  Lab 12/05/18 1029  BNP 450.0*     DDimer No results for input(s): DDIMER in the last 168 hours.   Radiology    Dg Chest Port 1 View  Result Date: 12/09/2018 CLINICAL DATA:  Respiratory failure. EXAM: PORTABLE CHEST 1 VIEW COMPARISON:  12/06/2018. FINDINGS: Interim removal of feeding tube. AICD in stable position. Stable cardiomegaly. Persistent  right base infiltrate and right pleural effusion. No interim change. No pneumothorax. IMPRESSION: 1.  Interim removal of feeding tube. 2.  AICD in stable position.  Stable cardiomegaly. 3. Persistent right base infiltrate and right-sided pleural effusion. No interim change. Electronically Signed   By: Marcello Moores  Register   On: 12/09/2018 07:00    Cardiac Studies   Echo 12/07/18: IMPRESSIONS  1. Left ventricular ejection fraction, by visual estimation, is 20%. The left ventricle has severely decreased function. Mildly increased left ventricular size. There is mildly increased left ventricular hypertrophy.  2. Left ventricular diastolic Doppler parameters are indeterminate in the setting of rapid atrial fibrillation.  3. Global right ventricle has mildly reduced systolic function.The right ventricular size is normal. No increase in right ventricular wall thickness.  4. Left atrial size was mild-moderately dilated.  5. Right atrial size was normal.  6. Trivial pericardial effusion is present.  7. The pericardial effusion is posterior to the left ventricle.  8. Mild aortic valve annular calcification.  9. The mitral valve is grossly normal. Mild mitral valve regurgitation. 10. The tricuspid valve is grossly normal. Tricuspid valve regurgitation mild-moderate. 11. The aortic valve is tricuspid Aortic valve regurgitation is mild to moderate by color flow Doppler. 12. The pulmonic valve was grossly normal. Pulmonic valve regurgitation is mild by color flow Doppler. 13. Moderately elevated pulmonary artery systolic pressure. 14. The inferior vena cava is normal in size with greater than 50% respiratory variability, suggesting right atrial pressure of 3 mmHg. 15. A device wire is visualized. 16. The tricuspid regurgitant velocity is 3.68 m/s, and with an assumed right atrial pressure of 3 mmHg, the estimated right ventricular systolic pressure is moderately elevated at 57.2 mmHg. 17. No obvious  vegetations involving valves or device wire.   Patient Profile     68 y.o. male with nonischemic cardiomyopathy status post ICD, consistent atrial fibrillation, left lower extremity DVT status post IVC filter, recent hemorrhagic stroke, CKD III, admitted with persistent fever and respiratory failure thought to be due to pneumonia.  Cardiology has been assisting with atrial fibrillation with rapid ventricular response.  Assessment & Plan    # Atrial fibrillation with rapid ventricular response: Mr. Stiteler remains in atrial fibrillation.  Rates are better-controlled.  His heart rate did not improve on amiodarone or digoxin in the past.  Continue metoprolol succinate 200 mg daily.  Diltiazem drip is down to 5mg /hr from 15mg  after starting digoxin.  Will increase digoxin to 295mcg daily. Check level on 10/23.  The  goal is to maintain HR <100 bpm and stop diltiazem. He had a recent hemorrhagic stroke.  Plan to initiate Eliquis once cleared by neurosurgery.  # Chronic systolic and diastolic heart failure: LVEF 15-20%.  S/p ICD.  He has LE edema 2/2 DVT.  Continue metoprolol succinate.    His home Delene Loll has been held in the setting of renal dysfunction.  Once this is stable would favor adding back Entresto instead of hydralazine.  He received several doses of IV lasix yesterday and renal function is stable but mildly elevated from baseline.   Start lasix 40mg  IV daily.  # Sepsis: Source unclear.  Treated empirically for aspirination pneumonia.  No clear micro data.  Thoracentesis fluid pending.  No evidence of hardware infection on TTE.    # DVT: S/p IVC filter. Currently on heparin and will switch to Eliquis when able.   Time spent: 30 minutes-Greater than 50% of this time was spent in counseling, explanation of diagnosis, planning of further management, and coordination of care.      For questions or updates, please contact [facility] Please consult [email or web address] for contact info  under        Signed, Skeet Latch, MD  12/10/2018, 11:10 AM

## 2018-12-10 NOTE — Progress Notes (Signed)
Report given to Elmer. Will transfer to unit in bed with NT.

## 2018-12-10 NOTE — Progress Notes (Signed)
[facility] for heparin Indication: atrial fibrillation, extensive DVT, high-probability PE  Allergies  Allergen Reactions  . Benadryl [Diphenhydramine] Palpitations    Patient Measurements: Height: 5\' 7"  (170.2 cm) Weight: 185 lb 13.6 oz (84.3 kg) IBW/kg (Calculated) : 66.1 Heparin Dosing Weight: 85kg  Vital Signs: Temp: 98.4 F (36.9 C) (10/21 0800) Temp Source: Oral (10/21 0800) BP: 145/80 (10/21 0800) Pulse Rate: 110 (10/21 0800)  Labs: Recent Labs    12/08/18 0503  12/09/18 0324 12/09/18 1448 12/09/18 2235 12/10/18 0739  HGB 11.4*  --  10.1*  --   --  10.7*  HCT 34.5*  --  30.5*  --   --  32.2*  PLT 352  --  339  --   --  327  HEPARINUNFRC <0.10*   < > 0.14* 0.20* 0.29* 0.42  CREATININE  --   --  1.45*  --   --  1.36*   < > = values in this interval not displayed.    Estimated Creatinine Clearance: 55.5 mL/min (A) (by C-G formula based on SCr of 1.36 mg/dL (H)).   Medical History: Past Medical History:  Diagnosis Date  . Atrial fibrillation ([place])   . CHF (congestive heart failure) ([place])   . Hepatitis C   . Hypertension   . Stroke ([place])   . Visit for monitoring Tikosyn therapy 03/26/2017     Assessment: 39 yoM with rapid AFib and DVT with hx recent hemorrhagic stroke. Pharmacy cleared to start IV heparin (low goal no bolus) by CCM and neurology with stable head CT on 10/19.  Heparin level therapeutic. CBC stable. No bleeding or issues with infusion reported.  Goal of Therapy:  Heparin level 0.3-0.5 units/ml Monitor platelets by anticoagulation protocol: Yes   Plan:  -Continue heparin at 2000 units/hr -6h heparin level to confirm -Monitor daily heparin level and CBC, s/sx bleeding  Elicia Lamp, PharmD, BCPS Please check AMION for all [facility] contact numbers Clinical Pharmacist 12/10/2018 8:35 AM

## 2018-12-10 NOTE — Progress Notes (Signed)
Nutrition Follow-up  DOCUMENTATION CODES:   Not applicable  INTERVENTION:   -Ensure Enlive po TID, each supplement provides 350 kcal and 20 grams of protein -MVI with minerals daily -Magic cup TID with meals, each supplement provides 290 kcal and 9 grams of protein -Hormel shake TID with meals, each supplement provides 520 kcals and 22 grams protein  NUTRITION DIAGNOSIS:   Inadequate oral intake related to inability to eat as evidenced by NPO status.  Progressing; advanced to dysphagia 1 diet with thin liquids  GOAL:   Patient will meet greater than or equal to 90% of their needs  Progressing   MONITOR:   PO intake, Supplement acceptance, Diet advancement, Labs, Weight trends, Skin, I & O's  REASON FOR ASSESSMENT:   Consult Enteral/tube feeding initiation and management  ASSESSMENT:   68 year old male who transferred from CIR on 10/13 with worsening respiratory distress. PMH significant for HTN, HLD, atrial fibrillation, CHF, s/p ICD and cardioversion 10/29/18, hepatitis C, CKD stage III. Pt was recently admitted on 9/21 with left hemiparesis and found to have large MCA subacute hemorrhage. After transfer to acute care, CT showing small right pleural effusion, gallstones with no evidence of cholecystitis. Pt transferred to the ICU.  10/15- s/p IVC filter insertion 10/16- cortrak placed (tip of tube in stomach), TF initiated 10/17- s/p BSE- advanced to dysphagia 1 diet with thin liquids 10/18- cortrak d/c, TF d/c  Reviewed I/O's: -680 ml x 24 hours and +438 ml since admission  UOP: 1.6 L x 24 hours  RD seen per request of nutritional services to verify food allergies.   Spoke with pt at bedside, who was sleepy at time of visit, but answered close ended questions. Pt reports his appetite is improving and tolerating pureed textures well. Meal completion 25-50%. Pt reports consuming Magic Cups, [employer], and Ensure and enjoys these supplements.   Pt denies any food  allergies, however, reports that he does not eat pork (pt confirmed that this is a food preference, not an allergy). Communicated this with Art therapist, Jocelyn Lamer.   Observed lunch tray, which was currently untouched. RD encouraged pt to eat. Discussed importance of good meal and supplement intake to promote healing.   Per RN, pt to transfer to SDU today.   Labs reviewed: Mg: 2.6. CBGS: 107-118.   NUTRITION - FOCUSED PHYSICAL EXAM:    Most Recent Value  Orbital Region  No depletion  Upper Arm Region  Mild depletion  Thoracic and Lumbar Region  No depletion  Buccal Region  No depletion  Temple Region  Mild depletion  Clavicle Bone Region  No depletion  Clavicle and Acromion Bone Region  No depletion  Scapular Bone Region  No depletion  Dorsal Hand  No depletion  Patellar Region  Mild depletion  Anterior Thigh Region  Mild depletion  Posterior Calf Region  Mild depletion  Edema (RD Assessment)  Mild  Hair  Reviewed  Eyes  Reviewed  Mouth  Reviewed  Skin  Reviewed  Nails  Reviewed       Diet Order:   Diet Order            DIET - DYS 1 Room service appropriate? Yes; Fluid consistency: Thin  Diet effective now              EDUCATION NEEDS:   Education needs have been addressed  Skin:  Skin Assessment: Skin Integrity Issues: Skin Integrity Issues:: Other (Comment), Incisions Incisions: closed rt neck Other: MASD bilateral groin  Last BM:  12/10/18  Height:   Ht Readings from Last 1 Encounters:  12/08/18 5\' 7"  (1.702 m)    Weight:   Wt Readings from Last 1 Encounters:  12/10/18 84.3 kg    Ideal Body Weight:  67.3 kg  BMI:  Body mass index is 29.11 kg/m.  Estimated Nutritional Needs:   Kcal:  2000-2200  Protein:  90-105 grams  Fluid:  >/= 2.0 L    Krosby Ritchie A. Jimmye Norman, RD, LDN, [place] Registered Dietitian II Certified Diabetes Care and Education Specialist Pager: [phone] After hours Pager: [phone]

## 2018-12-11 ENCOUNTER — Inpatient Hospital Stay (HOSPITAL_COMMUNITY): Payer: Medicare HMO

## 2018-12-11 ENCOUNTER — Other Ambulatory Visit: Payer: Self-pay

## 2018-12-11 DIAGNOSIS — Z7189 Other specified counseling: Secondary | ICD-10-CM

## 2018-12-11 DIAGNOSIS — I824Z3 Acute embolism and thrombosis of unspecified deep veins of distal lower extremity, bilateral: Secondary | ICD-10-CM

## 2018-12-11 LAB — COMPREHENSIVE METABOLIC PANEL
ALT: 29 U/L (ref 0–44)
AST: 32 U/L (ref 15–41)
Albumin: 2 g/dL — ABNORMAL LOW (ref 3.5–5.0)
Alkaline Phosphatase: 102 U/L (ref 38–126)
Anion gap: 10 (ref 5–15)
BUN: 30 mg/dL — ABNORMAL HIGH (ref 8–23)
CO2: 26 mmol/L (ref 22–32)
Calcium: 8.3 mg/dL — ABNORMAL LOW (ref 8.9–10.3)
Chloride: 99 mmol/L (ref 98–111)
Creatinine, Ser: 1.12 mg/dL (ref 0.61–1.24)
GFR calc Af Amer: >60 mL/min (ref >60)
GFR calc non Af Amer: >60 mL/min (ref >60)
Glucose, Bld: 108 mg/dL — ABNORMAL HIGH (ref 70–99)
Potassium: 3.9 mmol/L (ref 3.5–5.1)
Sodium: 135 mmol/L (ref 135–145)
Total Bilirubin: 0.9 mg/dL (ref 0.3–1.2)
Total Protein: 6.7 g/dL (ref 6.5–8.1)

## 2018-12-11 LAB — MAGNESIUM: Magnesium: 2.3 mg/dL (ref 1.7–2.4)

## 2018-12-11 LAB — CBC
HCT: 27.7 % — ABNORMAL LOW (ref 39.0–52.0)
HCT: 27.9 % — ABNORMAL LOW (ref 39.0–52.0)
Hemoglobin: 9 g/dL — ABNORMAL LOW (ref 13.0–17.0)
Hemoglobin: 9 g/dL — ABNORMAL LOW (ref 13.0–17.0)
MCH: 30.8 pg (ref 26.0–34.0)
MCH: 30.9 pg (ref 26.0–34.0)
MCHC: 32.5 g/dL (ref 30.0–36.0)
MCHC: 32.3 g/dL (ref 30.0–36.0)
MCV: 94.9 fL (ref 80.0–100.0)
MCV: 95.9 fL (ref 80.0–100.0)
Platelets: 363 10*3/uL (ref 150–400)
Platelets: 363 10*3/uL (ref 150–400)
RBC: 2.92 MIL/uL — ABNORMAL LOW (ref 4.22–5.81)
RBC: 2.91 MIL/uL — ABNORMAL LOW (ref 4.22–5.81)
RDW: 13.2 % (ref 11.5–15.5)
RDW: 13.2 % (ref 11.5–15.5)
WBC: 10.1 10*3/uL (ref 4.0–10.5)
WBC: 10 10*3/uL (ref 4.0–10.5)
nRBC: 0.2 % (ref 0.0–0.2)
nRBC: 0.2 % (ref 0.0–0.2)

## 2018-12-11 LAB — HEPARIN LEVEL (UNFRACTIONATED)
Heparin Unfractionated: <0.1 IU/mL — ABNORMAL LOW (ref 0.30–0.70)
Heparin Unfractionated: 0.58 IU/mL (ref 0.30–0.70)

## 2018-12-11 MED ORDER — FUROSEMIDE 40 MG PO TABS
40.0000 mg | ORAL_TABLET | Freq: Every day | ORAL | Status: DC
  Administered 2018-12-12 – 2018-12-24 (×13): 40 mg via ORAL
  Filled 2018-12-11 (×13): qty 1

## 2018-12-11 MED ORDER — SACUBITRIL-VALSARTAN 24-26 MG PO TABS
1.0000 | ORAL_TABLET | Freq: Two times a day (BID) | ORAL | Status: DC
  Filled 2018-12-11: qty 1

## 2018-12-11 NOTE — Progress Notes (Signed)
PROGRESS NOTE  Glenn [name] [id number] DOB: 1950-08-25   PCP: [name], MD  Patient is from: CIR. lives with friends before hospitalization.  DOA: 12/02/2018 LOS: 9  Brief Narrative / Interim history: 67 year old male with history of HTN, HLD, A. fib previously on Eliquis, EF 15-20% s/p ICD and cardioversion 10/29/2018, Hep C, CKD-III, recently hospitalized 9/21-10/13 for right MCA subacute hemorrhagic CVA and discharged to CIR. He returns the same date with A. fib with RVR and abdominal pain. Admitted to ICU for A. fib with RVR, sepsis likely due to aspiration pneumonia, AoC CHF, extensive DVT with IVC placement.  He is nowon room air. Still full code.  Cardiology following.  TRH reassumed care on 12/10/2018.  Repeat CT chest without contrast on 10/21 with progressive RLL pneumonia and possible cholecystitis.  Subjective: Patient had 42 beats of V. tach overnight but not symptomatic.  Vital signs are stable.  No complaint this morning.  He denies chest pain, dyspnea or abdominal pain.  Denies nausea or vomiting.  Objective: Vitals:   12/11/18 0348 12/11/18 0800 12/11/18 0900 12/11/18 1149  BP: 134/83 (!) 134/105  118/61  Pulse: 84 87 (!) 29 83  Resp: (!) 25 20 (!) 25 13  Temp:  98.9 F (37.2 C)  98.8 F (37.1 C)  TempSrc:  Oral  Oral  SpO2: 97% 95% 94% 98%  Weight:      Height:        Intake/Output Summary (Last 24 hours) at 12/11/2018 1353 Last data filed at 12/11/2018 1116 Gross per 24 hour  Intake 672.93 ml  Output 2601 ml  Net -1928.07 ml   Filed Weights   12/08/18 0423 12/09/18 0500 12/10/18 0500  Weight: 86.9 kg 85.5 kg 84.3 kg    Examination:  GENERAL: Chronically ill-appearing.  No acute distress. HEENT: MMM.  Vision and hearing grossly intact.  NECK: Supple.  No apparent JVD.  RESP:  No IWOB.  Fair air movement bilaterally.  Rhonchi. CVS:  RRR. Heart sounds normal.  ABD/GI/GU: Bowel sounds present. Soft.  Diffuse tenderness to  palpation.  No rebound or guarding. MSK/EXT:  No apparent deformity.  L LE edema.  Left hemiparesis. SKIN: no apparent skin lesion or wound NEURO: Awake, alert and oriented self, place, month and the year.  Left hemiparesis. PSYCH: Calm. Normal affect.   GENERAL: Chronically ill-appearing.  No acute distress. HEENT: MMM.  Vision and hearing grossly intact.  NECK: Supple.  No apparent JVD.  RESP:  No IWOB.  Fair air movement bilaterally.  Rhonchi bilaterally. CVS:  RRR. Heart sounds normal.  ABD/GI/GU: Bowel sounds present. Soft.  RUQ tenderness.  Positive Murphy. MSK/EXT: LLE edema.  Left hemiparesis. SKIN: no apparent skin lesion or wound NEURO: Awake oriented to self, place, year and the president. PSYCH: Calm.   Assessment & Plan: Sepsis: Likely due to RLL pneumonia/aspiration pneumonia and possible cholecystitis. CT chest without contrast on 10/22 reveals worsening RLL infiltrate and possible cholecystitis. Blood culture 10/16 with coag negative staph in 1 out of 2 bottles likely contaminant.  MRSA PCR negative.  Last fever 101.6 on 10/19.  Leukocytosis leukocytosis resolved. -10/8 ceftriaxone completed -10/13 zosyn >> -10/13 vanc > 10/18 -Discussed with infectious disease, Dr. Darryll Capers will continue Zosyn until good source control  Possible cholecystitis: Patient with RUQ tenderness and Murphy's signs.  CT suggestive for cholecystitis. -IV antibiotics as above -RUQ ultrasound.  A. fib with RVR: Heart rate in 90s.  Had 42 beats of V. tach overnight but not symptomatic.  Cardiology managing. -On digoxin, Cardizem, high-dose metoprolol with as needed -On heparin for anticoagulation  Acute on chronic systolic CHF: Echo [id number] with LVEF 15-20%.  ICD status. -IV Lasix per cardiology -Entresto on hold due to AKI. -Monitor fluid status, renal function and electrolytes and renal  Extensive DVT s/p IVC filter -Continue heparin -Eventually transition to Eliquis. -IR to reassess for  filter retrieval in about 8 to 12 weeks.  Acute respiratory failure with hypoxia: Multifactorial including sepsis, pneumonia, A. fib with RVR and CHF -Wean supplemental oxygen as above -Continue bronchodilators -Antibiotics as above -BiPAP at night  Right pleural effusion: Noted on CXR and CT abdomen and pelvis.  Reported are small on repeat CT 10/21.  -Treat underlying pneumonia  History of MCA hemorrhagic stroke with residual left hemiparesis/dysphagia: Hospitalized for this 9/21-10/13 and discharged to CIR. Returns with A. fib with RVR and sepsis as above -CT head 10/18-expected evolution of BG hemorrhage and prominent anterior right MCA infarct.  Resolved lesion of IVH -Closely monitor while on heparin -PT/OT.  Normocytic anemia: Hgb dropped to gram overnight.  Likely a combination of acute illness, blood draws and chronic illness.  No obvious bleeding.  No hematochezia or melena per RN. -Continue monitoring  History of hepatitis C -Outpatient follow-up  CKD-3: Stable.  Baseline Cr1.2-1.4.  -Continue monitoring while on diuretics  Dysphagia -Dysphagia 1 diet per SLP recommendation.   Malnutrition Type/Characteristics and interventions: Nutrition Problem: Inadequate oral intake Etiology: inability to eat  Signs/Symptoms: NPO status  Interventions: Ensure Enlive (each supplement provides 350kcal and 20 grams of protein), MVI, Magic cup, Hormel Shake   Goal of care discussion: Discussed about the CODE STATUS.  Patient defers decision to his son, [name].  Reached out to [name] over the phone and discussed the risk and benefits of CPR/intubation. [name] says, "do what it takes to keep my father alive".   DVT prophylaxis: On heparin drip Code Status: Full code Family Communication: Updated patient's son over the phone. Disposition Plan: Remains inpatient. Consultants: PCCM, cardiology, ID (over the phone)  Procedures:  None  Microbiology summarized: MRSA PCR  negative Exploited sputum not acceptable for culture Blood culture with coag negative staph in 1 out of 2 bottles Previous blood cultures negative.  Sch Meds:  Scheduled Meds: . chlorhexidine  15 mL Mouth Rinse BID  . Chlorhexidine Gluconate Cloth  6 each Topical Daily  . digoxin  0.25 mg Oral Daily  . feeding supplement (ENSURE ENLIVE)  237 mL Oral TID BM  . [START ON 12/12/2018] furosemide  40 mg Oral Daily  . gabapentin  300 mg Oral Q8H  . ipratropium  0.5 mg Nebulization BID  . levalbuterol  0.63 mg Nebulization BID  . mouth rinse  15 mL Mouth Rinse q12n4p  . metoprolol succinate  200 mg Oral Daily  . multivitamin with minerals  1 tablet Oral Daily  . [START ON 12/12/2018] sacubitril-valsartan  1 tablet Oral BID   Continuous Infusions: . sodium chloride Stopped (12/10/18 1036)  . diltiazem (CARDIZEM) infusion 5 mg/hr (12/11/18 1221)  . heparin 2,250 Units/hr (12/11/18 1110)  . piperacillin-tazobactam (ZOSYN)  IV 3.375 g (12/11/18 1329)   PRN Meds:.sodium chloride, acetaminophen, bisacodyl, calcium carbonate, hydrALAZINE, metoprolol tartrate, morphine injection  Antimicrobials: Anti-infectives (From admission, onward)   Start     Dose/Rate Route Frequency Ordered Stop   12/07/18 0800  vancomycin (VANCOCIN) 1,500 mg in sodium chloride 0.9 % 500 mL IVPB  Status:  Discontinued     1,500 mg 250  mL/hr over 120 Minutes Intravenous Every 24 hours 12/06/18 1206 12/07/18 0931   12/06/18 0330  vancomycin (VANCOCIN) 1,500 mg in sodium chloride 0.9 % 500 mL IVPB     1,500 mg 250 mL/hr over 120 Minutes Intravenous  Once 12/06/18 0323 12/06/18 0557   12/03/18 2200  vancomycin (VANCOCIN) 1,500 mg in sodium chloride 0.9 % 500 mL IVPB  Status:  Discontinued     1,500 mg 250 mL/hr over 120 Minutes Intravenous Every 24 hours 12/02/18 2115 12/03/18 0850   12/03/18 2100  vancomycin (VANCOCIN) 1,250 mg in sodium chloride 0.9 % 250 mL IVPB  Status:  Discontinued     1,250 mg 166.7 mL/hr over 90  Minutes Intravenous Every 24 hours 12/03/18 0850 12/03/18 1009   12/02/18 2100  piperacillin-tazobactam (ZOSYN) IVPB 3.375 g     3.375 g 12.5 mL/hr over 240 Minutes Intravenous Every 8 hours 12/02/18 2040     12/02/18 2045  vancomycin (VANCOCIN) 1,750 mg in sodium chloride 0.9 % 500 mL IVPB     1,750 mg 250 mL/hr over 120 Minutes Intravenous  Once 12/02/18 2040 12/03/18 0100       I have personally reviewed the following labs and images: CBC: Recent Labs  Lab 12/05/18 1029 12/08/18 0503 12/09/18 0324 12/10/18 0739 12/10/18 1731 12/11/18 0345  WBC 15.9* 18.3* 21.4* 14.5* 14.7* 10.1  NEUTROABS 13.0*  --   --   --  10.3*  --   HGB 11.3* 11.4* 10.1* 10.7* 10.8* 9.0*  HCT 33.9* 34.5* 30.5* 32.2* 32.3* 27.7*  MCV 96.0 94.5 93.3 93.9 94.7 94.9  PLT 213 352 339 327 337 363   BMP &GFR Recent Labs  Lab 12/05/18 2358 12/06/18 0647 12/07/18 0157 12/09/18 0324 12/10/18 0739 12/11/18 0345  NA  --  139 139 135 136 135  K  --  4.1 4.4 4.3 3.7 3.9  CL  --  104 103 95* 95* 99  CO2  --  23 24 24 27 26   GLUCOSE  --  109* 140* 114* 115* 108*  BUN  --  26* 25* 35* 39* 30*  CREATININE  --  1.29* 1.12 1.45* 1.36* 1.12  CALCIUM  --  8.6* 8.3* 8.5* 8.7* 8.3*  MG 2.4  --  2.1 2.2 2.6* 2.3  PHOS  --   --   --  3.9 3.7  --    Estimated Creatinine Clearance: 67.4 mL/min (by C-G formula based on SCr of 1.12 mg/dL). Liver & Pancreas: Recent Labs  Lab 12/11/18 0345  AST 32  ALT 29  ALKPHOS 102  BILITOT 0.9  PROT 6.7  ALBUMIN 2.0*   No results for input(s): LIPASE, AMYLASE in the last 168 hours. No results for input(s): AMMONIA in the last 168 hours. Diabetic: No results for input(s): HGBA1C in the last 72 hours. Recent Labs  Lab 12/09/18 2355 12/10/18 0347 12/10/18 0732 12/10/18 1119 12/10/18 1514  GLUCAP 128* 107* 109* 118* 108*   Cardiac Enzymes: No results for input(s): CKTOTAL, CKMB, CKMBINDEX, TROPONINI in the last 168 hours. No results for input(s): PROBNP in the last  8760 hours. Coagulation Profile: No results for input(s): INR, PROTIME in the last 168 hours. Thyroid Function Tests: No results for input(s): TSH, T4TOTAL, FREET4, T3FREE, THYROIDAB in the last 72 hours. Lipid Profile: No results for input(s): CHOL, HDL, LDLCALC, TRIG, CHOLHDL, LDLDIRECT in the last 72 hours. Anemia Panel: No results for input(s): VITAMINB12, FOLATE, FERRITIN, TIBC, IRON, RETICCTPCT in the last 72 hours. Urine analysis:  Component Value Date/Time   COLORURINE AMBER (A) 12/05/2018 0227   APPEARANCEUR HAZY (A) 12/05/2018 0227   LABSPEC 1.040 (H) 12/05/2018 0227   PHURINE 5.0 12/05/2018 0227   GLUCOSEU NEGATIVE 12/05/2018 0227   HGBUR SMALL (A) 12/05/2018 0227   BILIRUBINUR NEGATIVE 12/05/2018 0227   KETONESUR NEGATIVE 12/05/2018 0227   PROTEINUR 100 (A) 12/05/2018 0227   NITRITE NEGATIVE 12/05/2018 0227   LEUKOCYTESUR NEGATIVE 12/05/2018 0227   Sepsis Labs: Invalid input(s): PROCALCITONIN, [place]  Microbiology: Recent Results (from the past 240 hour(s))  Culture, blood (routine x 2)     Status: None   Collection Time: 12/02/18  3:28 PM   Specimen: BLOOD RIGHT ARM  Result Value Ref Range Status   Specimen Description BLOOD RIGHT ARM  Final   Special Requests AEROBIC BOTTLE ONLY Blood Culture adequate volume  Final   Culture   Final    NO GROWTH 5 DAYS Performed at [facility], [address], [place], [place] [address]    Report Status 12/07/2018 FINAL  Final  Culture, blood (routine x 2)     Status: None   Collection Time: 12/02/18  3:28 PM   Specimen: BLOOD RIGHT ARM  Result Value Ref Range Status   Specimen Description BLOOD RIGHT ARM  Final   Special Requests AEROBIC BOTTLE ONLY Blood Culture adequate volume  Final   Culture   Final    NO GROWTH 5 DAYS Performed at [facility], [place] [address] [place], [place] [address]    Report Status 12/07/2018 FINAL  Final  Urine Culture     Status: None   Collection Time: 12/02/18  9:45  PM   Specimen: Urine, Catheterized  Result Value Ref Range Status   Specimen Description URINE, CATHETERIZED  Final   Special Requests NONE  Final   Culture   Final    NO GROWTH Performed at [facility], [place] [address] [place], [place] [address]    Report Status 12/04/2018 FINAL  Final  MRSA PCR Screening     Status: None   Collection Time: 12/02/18  9:54 PM   Specimen: Nasal Mucosa; Nasopharyngeal  Result Value Ref Range Status   MRSA by PCR NEGATIVE NEGATIVE Final    Comment:        The GeneXpert MRSA Assay (FDA approved for NASAL specimens only), is one component of a comprehensive MRSA colonization surveillance program. It is not intended to diagnose MRSA infection nor to guide or monitor treatment for MRSA infections. Performed at [facility], [place] [address] [place], [place] [address]   Culture, blood (routine x 2)     Status: None   Collection Time: 12/05/18  4:06 AM   Specimen: BLOOD LEFT HAND  Result Value Ref Range Status   Specimen Description BLOOD LEFT HAND  Final   Special Requests   Final    BOTTLES DRAWN AEROBIC ONLY Blood Culture adequate volume   Culture   Final    NO GROWTH 5 DAYS Performed at [facility], [place] [address] [place], [place] [address]    Report Status 12/10/2018 FINAL  Final  Culture, blood (routine x 2)     Status: Abnormal   Collection Time: 12/05/18  4:06 AM   Specimen: BLOOD RIGHT HAND  Result Value Ref Range Status   Specimen Description BLOOD RIGHT HAND  Final   Special Requests   Final    BOTTLES DRAWN AEROBIC ONLY Blood Culture results may not  be optimal due to an inadequate volume of blood received in culture bottles   Culture  Setup Time   Final    AEROBIC BOTTLE ONLY GRAM POSITIVE COCCI CRITICAL RESULT CALLED TO, READ BACK BY AND VERIFIED WITH: V BRYK PHARMD 12/06/18 0315 JDW    Culture (A)  Final    STAPHYLOCOCCUS SPECIES (COAGULASE NEGATIVE) THE SIGNIFICANCE OF ISOLATING THIS ORGANISM FROM A SINGLE  SET OF BLOOD CULTURES WHEN MULTIPLE SETS ARE DRAWN IS UNCERTAIN. PLEASE NOTIFY THE MICROBIOLOGY DEPARTMENT WITHIN ONE WEEK IF SPECIATION AND SENSITIVITIES ARE REQUIRED. Performed at [facility], [place] [address] [place], [place] [address]    Report Status 12/07/2018 FINAL  Final  Expectorated sputum assessment w rflx to resp cult     Status: None   Collection Time: 12/06/18  9:06 PM   Specimen: SPU  Result Value Ref Range Status   Specimen Description SPUTUM  Final   Special Requests NONE  Final   Sputum evaluation   Final    Sputum specimen not acceptable for testing.  Please recollect.   RESULT CALLED TO, READ BACK BY AND VERIFIED WITH: RN York Cerise CRUZ [id number] FCP Performed at [place] [address], [place],  [address]    Report Status 12/07/2018 FINAL  Final    Radiology Studies: Ct Chest Wo Contrast  Result Date: 12/11/2018 CLINICAL DATA:  Hypoxemia respiratory failure EXAM: CT CHEST WITHOUT CONTRAST TECHNIQUE: Multidetector CT imaging of the chest was performed following the standard protocol without IV contrast. COMPARISON:  Multiple chest x-rays.  Abdominal CT from 9 days ago FINDINGS: Cardiovascular: Cardiomegaly with ICD lead into the right ventricular apex. No pericardial effusion. Aortic and coronary atherosclerosis. Mediastinum/Nodes: Negative for adenopathy or inflammation Lungs/Pleura: Consolidation in the right lower lobe with a degree of volume loss, progressed. Small layering right pleural effusion. Mild atelectatic opacity in the dependent left lower lobe. No pulmonary edema Upper Abdomen: Fat edema around the gallbladder which likely has a thickened wall. There is cholelithiasis. Musculoskeletal: No acute or aggressive finding IMPRESSION: 1. Right lower lobe pneumonia with superimposed atelectasis, progressed from abdominal CT 9 days ago. Small right pleural effusion that is layering. 2. Gallstones and new pericholecystic edema, please correlate  for symptoms of cholecystitis. 3. Cardiomegaly without edema. Electronically Signed   By: Monte Fantasia M.D.   On: 12/11/2018 04:08   Taye T. [place]  If 7PM-7AM, please contact night-coverage [email or web address] Password [email or web address] 12/11/2018, 1:53 PM

## 2018-12-11 NOTE — Care Management Important Message (Signed)
Important Message  Patient Details  Name: Glenn [name] MRN: [id number] Date of Birth: March 08, 1950   Medicare Important Message Given:  Yes     [name] 12/11/2018, 1:35 PM

## 2018-12-11 NOTE — Progress Notes (Signed)
Glenn [name] arrived to pt's room to draw Heparin level.

## 2018-12-11 NOTE — Progress Notes (Signed)
ANTICOAGULATION CONSULT NOTE - Follow Up Consult  Pharmacy Consult for Heparin Indication: atrial fibrillation (CHADS2VASc= 5), extensive DVT s/p IVC filter   Allergies  Allergen Reactions  . Benadryl [Diphenhydramine] Palpitations    Patient Measurements: Height: 5\' 7"  (170.2 cm) Weight: 185 lb 13.6 oz (84.3 kg) IBW/kg (Calculated) : 66.1 Heparin Dosing Weight: 85 kg  Vital Signs: Temp: 98.8 F (37.1 C) (10/22 1149) Temp Source: Oral (10/22 1149) BP: 130/86 (10/22 1520) Pulse Rate: 70 (10/22 1520)  Labs: Recent Labs    12/09/18 0324  12/10/18 0739 12/10/18 1411 12/10/18 1731 12/11/18 0345 12/11/18 1512 12/11/18 1748  HGB 10.1*  --  10.7*  --  10.8* 9.0* 9.0*  --   HCT 30.5*  --  32.2*  --  32.3* 27.7* 27.9*  --   PLT 339  --  327  --  337 363 363  --   HEPARINUNFRC 0.14*   < > 0.42 0.39  --  <0.10*  --  0.58  CREATININE 1.45*  --  1.36*  --   --  1.12  --   --    < > = values in this interval not displayed.    Estimated Creatinine Clearance: 67.4 mL/min (by C-G formula based on SCr of 1.12 mg/dL).  Assessment: 65 yoM with rapid Afib (CHADS2VASc= 5) and LLE DVT s/p IVC filter, with hx recent hemorrhagic stroke. Pharmacy cleared to start IV heparin (low goal no bolus) by CCM and neurology with stable head CT on 10/19. Plan to transition to apixaban once cleared by neurosurgery.  Hgb 10.8> 9 then stable at 9. No bleeding per notes and RN.   HL supratherapeutic 0.58. Suspect that previous undetectable level was an error. When patient was on 2000 units/hr had levels 0.39 and 0.42, will decrease 2100 given goal range.    Goal of Therapy:  Heparin level 0.3-0.5 units/ml Monitor platelets by anticoagulation protocol: Yes   Plan:  -Decrease heparin to 2100 units/hr -Re-check heparin level with AM labs  Benetta Spar, PharmD, BCPS, [facility] Clinical Pharmacist

## 2018-12-11 NOTE — Evaluation (Signed)
Physical Therapy Evaluation Patient Details Name: Glenn [name] MRN: [id number] DOB: [birth year] Today's Date: 12/11/2018   History of Present Illness  Pt is a 68 y/o male admitted from CIR secondary to a fib with RVR. Also found to have DVT on LLE and is s/p IVC filter placement. 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 PMH includes a fib, CHF, hep C, HTN, and s/p ICD.   Clinical Impression  Pt admitted secondary to problem above with deficits below. Pt requiring mod-max A +2 to transfer to chair using stedy this session. Pt continues to present with L sided weakness from previous CVA. Feel pt will require increased assist at d/c given current mobility deficits. Will continue to follow acutely to maximize functional mobility independence and safety.     Follow Up Recommendations SNF;Supervision/Assistance - 24 hour    Equipment Recommendations  Other (comment)(TBD)    Recommendations for Other Services       Precautions / Restrictions Precautions Precautions: Fall Precaution Comments: L hemi Restrictions Weight Bearing Restrictions: No      Mobility  Bed Mobility Overal bed mobility: Needs Assistance Bed Mobility: Supine to Sit     Supine to sit: Mod assist;+2 for physical assistance     General bed mobility comments: Mod A +2 for LLE assist and trunk elevation to come to sitting. Required assist to scoot L hip towards EOB as well. Able to maintain sitting balance once upright.   Transfers Overall transfer level: Needs assistance Equipment used: 2 person hand held assist Transfers: Sit to/from Stand Sit to Stand: Mod assist;Max assist;+2 physical assistance         General transfer comment: Attempted standing with HHA and +2 support, however, pt unable to come up to full upright. Used stedy to perform transfer to chair. Required mod-max A +2 to stand using stedy. Required manual assist to maintain L hand placement secondary to weakness.    Ambulation/Gait                Stairs            Wheelchair Mobility    Modified Rankin (Stroke Patients Only)       Balance Overall balance assessment: Needs assistance Sitting-balance support: No upper extremity supported;Feet supported Sitting balance-Leahy Scale: Fair     Standing balance support: During functional activity;Single extremity supported Standing balance-Leahy Scale: Poor Standing balance comment: Reliant on RUE and external support                              Pertinent Vitals/Pain Pain Assessment: Faces Faces Pain Scale: No hurt    Home Living Family/patient expects to be discharged to:: Skilled nursing facility Living Arrangements: Non-relatives/Friends                    Prior Function Level of Independence: Needs assistance   Gait / Transfers Assistance Needed: Was working on transfers and gait with therapy staff at CIR.   ADL's / Homemaking Assistance Needed: Required assist for ADL tasks.         Hand Dominance   Dominant Hand: Right    Extremity/Trunk Assessment   Upper Extremity Assessment Upper Extremity Assessment: Defer to OT evaluation    Lower Extremity Assessment Lower Extremity Assessment: LLE deficits/detail LLE Deficits / Details: LLE weakness at baseline secondary to recent CVA.     Cervical / Trunk Assessment Cervical / Trunk Assessment: Normal  Communication   Communication: Expressive difficulties;Receptive difficulties  Cognition Arousal/Alertness: Awake/alert Behavior During Therapy: WFL for tasks assessed/performed Overall Cognitive Status: No family/caregiver present to determine baseline cognitive functioning                                 General Comments: Pt with recent CVA, so unsure of new baseline cognition. Pt requiring multimodal cues for sequencing.       General Comments      Exercises     Assessment/Plan    PT Assessment Patient needs  continued PT services  PT Problem List Decreased strength;Decreased activity tolerance;Decreased balance;Decreased mobility;Decreased cognition;Decreased knowledge of use of DME;Decreased safety awareness;Decreased knowledge of precautions       PT Treatment Interventions DME instruction;Stair training;Therapeutic activities;Balance training;Cognitive remediation;Patient/family education;Therapeutic exercise;Functional mobility training;Gait training;Neuromuscular re-education    PT Goals (Current goals can be found in the Care Plan section)  Acute Rehab PT Goals Patient Stated Goal: to get to the chair PT Goal Formulation: With patient Time For Goal Achievement: 12/25/18 Potential to Achieve Goals: Fair    Frequency Min 2X/week   Barriers to discharge        Co-evaluation               AM-PAC PT "6 Clicks" Mobility  Outcome Measure Help needed turning from your back to your side while in a flat bed without using bedrails?: A Lot Help needed moving from lying on your back to sitting on the side of a flat bed without using bedrails?: A Lot Help needed moving to and from a bed to a chair (including a wheelchair)?: Total Help needed standing up from a chair using your arms (e.g., wheelchair or bedside chair)?: A Lot Help needed to walk in hospital room?: Total Help needed climbing 3-5 steps with a railing? : Total 6 Click Score: 9    End of Session Equipment Utilized During Treatment: Gait belt Activity Tolerance: Patient tolerated treatment well Patient left: in chair;with call bell/phone within reach;with chair alarm set Nurse Communication: Mobility status;Need for lift equipment PT Visit Diagnosis: Other abnormalities of gait and mobility (R26.89);Hemiplegia and hemiparesis;Difficulty in walking, not elsewhere classified (R26.2) Hemiplegia - Right/Left: Left Hemiplegia - dominant/non-dominant: Non-dominant Hemiplegia - caused by: Cerebral infarction;Nontraumatic  intracerebral hemorrhage    Time: 1150-1210 PT Time Calculation (min) (ACUTE ONLY): 20 min   Charges:   PT Evaluation $PT Eval Moderate Complexity: 1 Mod          Glenn [name], PT, DPT  Acute Rehabilitation Services  Pager: [phone] Office: [phone]   Rudean Hitt 12/11/2018, 1:47 PM

## 2018-12-11 NOTE — Progress Notes (Signed)
[place] for Heparin Indication: atrial fibrillation, extensive DVT, high-probability PE  Allergies  Allergen Reactions  . Benadryl [Diphenhydramine] Palpitations    Patient Measurements: Height: 5\' 7"  (170.2 cm) Weight: 185 lb 13.6 oz (84.3 kg) IBW/kg (Calculated) : 66.1 Heparin Dosing Weight: 85kg  Vital Signs: Temp: 98 F (36.7 C) (10/21 2130) Temp Source: Oral (10/21 2130) BP: 134/83 (10/22 0348) Pulse Rate: 84 (10/22 0348)  Labs: Recent Labs    12/09/18 0324  12/10/18 0739 12/10/18 1411 12/10/18 1731 12/11/18 0345  HGB 10.1*  --  10.7*  --  10.8* 9.0*  HCT 30.5*  --  32.2*  --  32.3* 27.7*  PLT 339  --  327  --  337 363  HEPARINUNFRC 0.14*   < > 0.42 0.39  --  <0.10*  CREATININE 1.45*  --  1.36*  --   --  1.12   < > = values in this interval not displayed.    Estimated Creatinine Clearance: 67.4 mL/min (by C-G formula based on SCr of 1.12 mg/dL).   Medical History: Past Medical History:  Diagnosis Date  . Atrial fibrillation ([place])   . CHF (congestive heart failure) ([place])   . Hepatitis C   . Hypertension   . Stroke ([place])   . Visit for monitoring Tikosyn therapy 03/26/2017     Assessment: 44 yoM with rapid AFib and DVT with hx recent hemorrhagic stroke. Pharmacy cleared to start IV heparin (low goal no bolus) by CCM and neurology with stable head CT on 10/19.  Heparin level remains therapeutic. CBC stable. No bleeding or issues with infusion reported.  10/22 AM update:  Heparin level undetectable No issues with infusion per RN  Goal of Therapy:  Heparin level 0.3-0.5 units/ml Monitor platelets by anticoagulation protocol: Yes   Plan:  -Inc heparin to 2250 units/hr -Re-check heparin level at [place], PharmD, [place] Pharmacist Phone: [phone]

## 2018-12-11 NOTE — NC FL2 (Signed)
[place] LEVEL OF CARE SCREENING TOOL     IDENTIFICATION  Patient Name: Glenn [name] Birthdate: 03/23/1950 Sex: male Admission Date (Current Location): 12/02/2018  [facility] and [place] Number:  Herbalist and Address:  [facility] [place]. [facility], [place] [address], [place], [place] [address]      Provider Number: [id number]  Attending Physician Name and Address:  Mercy Riding, MD  Relative Name and Phone Number:       Current Level of Care: Hospital Recommended Level of Care: [place] Prior Approval Number:    Date Approved/Denied:   PASRR Number: [id number]  Discharge Plan: SNF    Current Diagnoses: Patient Active Problem List   Diagnosis Date Noted  . Acute deep vein thrombosis (DVT) of both lower extremities ([name])   . Rapid atrial fibrillation ([place]) 12/02/2018  . Acute blood loss anemia   . Labile blood glucose   . AKI (acute kidney injury) ([place])   . Chronic right hip pain   . Acute renal failure superimposed on stage 3a chronic kidney disease ([place])   . Cough with fever   . Headache due to intracranial disease 11/14/2018  . Trochanteric bursitis, right hip 11/14/2018  . Cerebral edema ([place]) 11/13/2018  . Hyperlipidemia LDL goal <70 11/13/2018  . Marijuana user 11/13/2018  . Right middle cerebral artery stroke ([place]) 11/13/2018  . Acute CVA (cerebrovascular accident) ([place])   . Noncompliance   . Dysphagia, post-stroke   . Acute on chronic combined systolic and diastolic CHF (congestive heart failure) ([place])   . Wide-complex tachycardia ([place])   . DCM (dilated cardiomyopathy) ([place])   . Atrial fibrillation ([place]) 11/04/2018  . Acute on chronic systolic heart failure ([place])   . Prolonged Q-T interval on ECG   . Elevated troponin   . CHF exacerbation ([place]) 10/30/2018  . Acute respiratory failure with hypoxia ([place])   . Acute kidney injury ([place])   . Entrapment of right ulnar nerve 02/28/2018  . Carpal tunnel  syndrome of right wrist 02/28/2018  . Impotence due to erectile dysfunction 09/30/2017  . Solitary pulmonary nodule 06/10/2017  . Neck pain 04/06/2017  . Paroxysmal atrial fibrillation ([name])   . Visit for monitoring Tikosyn therapy 03/25/2017  . ICD (implantable cardioverter-defibrillator) in place 09/13/2016  . Chest pain 09/13/2016  . Tobacco abuse 09/13/2016  . Hospital discharge follow-up 09/13/2016  . Upper back pain 09/13/2016  . Housing problems 09/13/2016  . Persistent atrial fibrillation ([place])   . Atrial fibrillation with RVR ([place])   . Ischemic cardiomyopathy   . Cerebral infarction ([place])   . Stroke (cerebrum) ([name]) Lg L MCA infarct w/ hemorrhagic conversion, embolic d/t AF [id number]  . CHF (congestive heart failure) ([place]) 08/14/2016  . HTN (hypertension) 08/14/2016  . Chronic Hepatitis C  08/14/2016    Orientation RESPIRATION BLADDER Height & Weight     Self, Time, Situation  O2(nasal cannula 2L) Continent Weight: 185 lb 13.6 oz (84.3 kg) Height:  5\' 7"  (170.2 cm)  BEHAVIORAL SYMPTOMS/MOOD NEUROLOGICAL BOWEL NUTRITION STATUS      Continent Diet(NPO at this time, subject to change, please see d/c summary)  AMBULATORY STATUS COMMUNICATION OF NEEDS Skin   Extensive Assist Verbally Surgical wounds(closed incision, right neck)                       Personal Care Assistance Level of Assistance  Bathing, Dressing, Feeding Bathing Assistance: Maximum assistance Feeding assistance: Independent Dressing Assistance:  Maximum assistance     Functional Limitations Info  Sight, Hearing, Speech Sight Info: Adequate Hearing Info: Adequate Speech Info: Adequate    SPECIAL CARE FACTORS FREQUENCY  PT (By licensed PT), OT (By licensed OT)     PT Frequency: 5x OT Frequency: 5x            Contractures Contractures Info: Not present    Additional Factors Info  Code Status, Allergies Code Status Info: Full Code Allergies Info: Benadryl (Diphenhydramine)            Current Medications (12/11/2018):  This is the current hospital active medication list Current Facility-Administered Medications  Medication Dose Route Frequency Provider Last Rate Last Dose  . 0.9 %  sodium chloride infusion   Intravenous PRN Rush Farmer, MD   Stopped at 12/10/18 1036  . acetaminophen (TYLENOL) tablet 1,000 mg  1,000 mg Oral Q6H PRN Olalere, Adewale A, MD   1,000 mg at 12/11/18 1457  . bisacodyl (DULCOLAX) suppository 10 mg  10 mg Rectal Daily PRN Olalere, Adewale A, MD   10 mg at 12/04/18 1738  . calcium carbonate (TUMS - dosed in mg elemental calcium) chewable tablet 200 mg of elemental calcium  1 tablet Oral Daily PRN Margaretha Seeds, MD   200 mg of elemental calcium at 12/07/18 1635  . chlorhexidine (PERIDEX) 0.12 % solution 15 mL  15 mL Mouth Rinse BID Frederik Pear, MD   15 mL at 12/11/18 0852  . Chlorhexidine Gluconate Cloth 2 % PADS 6 each  6 each Topical Daily Shellia Cleverly, MD   6 each at 12/11/18 0745  . digoxin (LANOXIN) tablet 0.25 mg  0.25 mg Oral Daily Skeet Latch, MD   0.25 mg at 12/11/18 0853  . diltiazem (CARDIZEM) 125 mg in dextrose 5% 125 mL (1 mg/mL) infusion  5-15 mg/hr Intravenous Continuous Skeet Latch, MD 5 mL/hr at 12/11/18 1221 5 mg/hr at 12/11/18 1221  . feeding supplement (ENSURE ENLIVE) (ENSURE ENLIVE) liquid 237 mL  237 mL Oral TID BM Margaretha Seeds, MD   237 mL at 12/11/18 0853  . [START ON 12/12/2018] furosemide (LASIX) tablet 40 mg  40 mg Oral Daily Skeet Latch, MD      . gabapentin (NEURONTIN) 250 MG/5ML solution 300 mg  300 mg Oral Q8H Margaretha Seeds, MD   300 mg at 12/11/18 1346  . heparin ADULT infusion 100 units/mL (25000 units/256mL sodium chloride 0.45%)  2,250 Units/hr Intravenous Continuous Erenest Blank, RPH 22.5 mL/hr at 12/11/18 1110 2,250 Units/hr at 12/11/18 1110  . hydrALAZINE (APRESOLINE) tablet 25 mg  25 mg Oral Q6H PRN Anders Simmonds, MD   25 mg at 12/07/18 2215  . ipratropium  (ATROVENT) nebulizer solution 0.5 mg  0.5 mg Nebulization BID Wendee Beavers T, MD   0.5 mg at 12/11/18 0900  . levalbuterol (XOPENEX) nebulizer solution 0.63 mg  0.63 mg Nebulization BID Wendee Beavers T, MD   0.63 mg at 12/11/18 0900  . MEDLINE mouth rinse  15 mL Mouth Rinse q12n4p Frederik Pear, MD   15 mL at 12/11/18 1112  . metoprolol succinate (TOPROL-XL) 24 hr tablet 200 mg  200 mg Oral Daily Skeet Latch, MD   200 mg at 12/11/18 [id number]  . metoprolol tartrate (LOPRESSOR) injection 5 mg  5 mg Intravenous Q3H PRN Shearon Stalls, Rahul P, PA-C   5 mg at 12/08/18 1252  . morphine 2 MG/ML injection 2 mg  2 mg Intravenous Q4H PRN Olalere, Adewale  A, MD   2 mg at 12/09/18 0838  . multivitamin with minerals tablet 1 tablet  1 tablet Oral Daily Margaretha Seeds, MD   1 tablet at 12/11/18 [phone]  . piperacillin-tazobactam (ZOSYN) IVPB 3.375 g  3.375 g Intravenous Q8H Jennette Kettle M, DO 12.5 mL/hr at 12/11/18 1329 3.375 g at 12/11/18 1329  . [START ON 12/12/2018] sacubitril-valsartan (ENTRESTO) 24-26 mg per tablet  1 tablet Oral BID Skeet Latch, MD         Discharge Medications: Please see discharge summary for a list of discharge medications.  Relevant Imaging Results:  Relevant Lab Results:   Additional Information [id number]  Eileen Stanford, LCSW

## 2018-12-11 NOTE — Progress Notes (Signed)
SLP Cancellation Note  Patient Details Name: Glenn [name] MRN: [id number] DOB: Aug 11, 1950   Cancelled treatment:       Reason Eval/Treat Not Completed: Other (comment). Unable to assess diet tolerance, as pt is currently NPO for ultrasound today. Per RN, pt has taken limited PO, but appears to tolerate current diet (Dys1/thin) without overt s/s aspiration. Will continue efforts.  Euva Rundell B. Quentin Ore, [facility], [place] Speech Language Pathologist Office: [phone]  Shonna Chock 12/11/2018, 3:35 PM

## 2018-12-11 NOTE — Progress Notes (Signed)
Lab came to draw heparin on pt and unsuccessful on blood draw. Stated she would have someone else attempt for blood draw. No one ever came for second attempt. RN made pharmacy aware and placed STAT order for Heparin level to be drawn. Will continue to monitor and call lab if no response.

## 2018-12-11 NOTE — Progress Notes (Signed)
Progress Note  Patient Name: Glenn [name] Date of Encounter: 12/11/2018  Primary Cardiologist: Virl Axe, MD   Subjective   Feeling better.  Breathing is stable.  No nausea or leg pain today.  Inpatient Medications    Scheduled Meds: . chlorhexidine  15 mL Mouth Rinse BID  . Chlorhexidine Gluconate Cloth  6 each Topical Daily  . digoxin  0.25 mg Oral Daily  . feeding supplement (ENSURE ENLIVE)  237 mL Oral TID BM  . furosemide  40 mg Intravenous Daily  . gabapentin  300 mg Oral Q8H  . ipratropium  0.5 mg Nebulization BID  . levalbuterol  0.63 mg Nebulization BID  . mouth rinse  15 mL Mouth Rinse q12n4p  . metoprolol succinate  200 mg Oral Daily  . multivitamin with minerals  1 tablet Oral Daily   Continuous Infusions: . sodium chloride Stopped (12/10/18 1036)  . diltiazem (CARDIZEM) infusion 5 mg/hr (12/10/18 1400)  . heparin 2,250 Units/hr (12/11/18 0552)  . piperacillin-tazobactam (ZOSYN)  IV 3.375 g (12/11/18 0556)   PRN Meds: sodium chloride, acetaminophen, bisacodyl, calcium carbonate, hydrALAZINE, metoprolol tartrate, morphine injection   Vital Signs    Vitals:   12/11/18 0257 12/11/18 0348 12/11/18 0800 12/11/18 0900  BP:  134/83 (!) 134/105   Pulse: 81 84 87 (!) 29  Resp: 13 (!) 25 20 (!) 25  Temp:   98.9 F (37.2 C)   TempSrc:   Oral   SpO2: 99% 97% 95% 94%  Weight:      Height:        Intake/Output Summary (Last 24 hours) at 12/11/2018 0948 Last data filed at 12/11/2018 0626 Gross per 24 hour  Intake 610.57 ml  Output 1201 ml  Net -590.43 ml   Last 3 Weights 12/10/2018 12/09/2018 12/08/2018  Weight (lbs) 185 lb 13.6 oz 188 lb 7.9 oz 191 lb 9.3 oz  Weight (kg) 84.3 kg 85.5 kg 86.9 kg      Telemetry    Atrial fibrillation.  Rate <100 bpm.  - Personally Reviewed  ECG    n/a - Personally Reviewed  Physical Exam   VS:  BP (!) 134/105 (BP Location: Left Arm)   Pulse (!) 29   Temp 98.9 F (37.2 C) (Oral)   Resp (!) 25   Ht  5\' 7"  (1.702 m)   Wt 84.3 kg   SpO2 94%   BMI 29.11 kg/m  , BMI Body mass index is 29.11 kg/m. GENERAL: Ill -appearing HEENT: Pupils equal round and reactive, fundi not visualized, oral mucosa unremarkable.  R facial droop NECK: No jugular venous distention, waveform within normal limits, carotid upstroke brisk and symmetric, no bruits LUNGS:  Clear to auscultation bilaterally HEART: Irregularly irregular.  PMI not displaced or sustained,S1 and S2 within normal limits, no S3, no S4, no clicks, no rubs, no murmurs ABD:  Flat, positive bowel sounds normal in frequency in pitch, no bruits, no rebound, no guarding, no midline pulsatile mass, no hepatomegaly, no splenomegaly EXT:  2 plus pulses throughout, 2+ L LE edema, no R LE edema,  Bilateral UE edema.  No cyanosis no clubbing SKIN:  No rashes no nodules NEURO:  L hemiparesis. PSYCH:  Cognitively intact, oriented to person place and time   Labs    High Sensitivity Troponin:   Recent Labs  Lab 12/02/18 2209 12/03/18 0225  TROPONINIHS 26* 25*      Chemistry Recent Labs  Lab 12/09/18 0324 12/10/18 0739 12/11/18 0345  NA 135 136  135  K 4.3 3.7 3.9  CL 95* 95* 99  CO2 24 27 26   GLUCOSE 114* 115* 108*  BUN 35* 39* 30*  CREATININE 1.45* 1.36* 1.12  CALCIUM 8.5* 8.7* 8.3*  PROT  --   --  6.7  ALBUMIN  --   --  2.0*  AST  --   --  32  ALT  --   --  29  ALKPHOS  --   --  102  BILITOT  --   --  0.9  GFRNONAA 50* 54* >60  GFRAA 58* >60 >60  ANIONGAP 16* 14 10     Hematology Recent Labs  Lab 12/10/18 0739 12/10/18 1731 12/11/18 0345  WBC 14.5* 14.7* 10.1  RBC 3.43* 3.41* 2.92*  HGB 10.7* 10.8* 9.0*  HCT 32.2* 32.3* 27.7*  MCV 93.9 94.7 94.9  MCH 31.2 31.7 30.8  MCHC 33.2 33.4 32.5  RDW 13.5 13.6 13.2  PLT 327 337 363    BNP Recent Labs  Lab 12/05/18 1029  BNP 450.0*     DDimer No results for input(s): DDIMER in the last 168 hours.   Radiology    Ct Chest Wo Contrast  Result Date: 12/11/2018  CLINICAL DATA:  Hypoxemia respiratory failure EXAM: CT CHEST WITHOUT CONTRAST TECHNIQUE: Multidetector CT imaging of the chest was performed following the standard protocol without IV contrast. COMPARISON:  Multiple chest x-rays.  Abdominal CT from 9 days ago FINDINGS: Cardiovascular: Cardiomegaly with ICD lead into the right ventricular apex. No pericardial effusion. Aortic and coronary atherosclerosis. Mediastinum/Nodes: Negative for adenopathy or inflammation Lungs/Pleura: Consolidation in the right lower lobe with a degree of volume loss, progressed. Small layering right pleural effusion. Mild atelectatic opacity in the dependent left lower lobe. No pulmonary edema Upper Abdomen: Fat edema around the gallbladder which likely has a thickened wall. There is cholelithiasis. Musculoskeletal: No acute or aggressive finding IMPRESSION: 1. Right lower lobe pneumonia with superimposed atelectasis, progressed from abdominal CT 9 days ago. Small right pleural effusion that is layering. 2. Gallstones and new pericholecystic edema, please correlate for symptoms of cholecystitis. 3. Cardiomegaly without edema. Electronically Signed   By: Monte Fantasia M.D.   On: 12/11/2018 04:08    Cardiac Studies   Echo 12/07/18: IMPRESSIONS  1. Left ventricular ejection fraction, by visual estimation, is 20%. The left ventricle has severely decreased function. Mildly increased left ventricular size. There is mildly increased left ventricular hypertrophy.  2. Left ventricular diastolic Doppler parameters are indeterminate in the setting of rapid atrial fibrillation.  3. Global right ventricle has mildly reduced systolic function.The right ventricular size is normal. No increase in right ventricular wall thickness.  4. Left atrial size was mild-moderately dilated.  5. Right atrial size was normal.  6. Trivial pericardial effusion is present.  7. The pericardial effusion is posterior to the left ventricle.  8. Mild aortic  valve annular calcification.  9. The mitral valve is grossly normal. Mild mitral valve regurgitation. 10. The tricuspid valve is grossly normal. Tricuspid valve regurgitation mild-moderate. 11. The aortic valve is tricuspid Aortic valve regurgitation is mild to moderate by color flow Doppler. 12. The pulmonic valve was grossly normal. Pulmonic valve regurgitation is mild by color flow Doppler. 13. Moderately elevated pulmonary artery systolic pressure. 14. The inferior vena cava is normal in size with greater than 50% respiratory variability, suggesting right atrial pressure of 3 mmHg. 15. A device wire is visualized. 16. The tricuspid regurgitant velocity is 3.68 m/s, and with an assumed right  atrial pressure of 3 mmHg, the estimated right ventricular systolic pressure is moderately elevated at 57.2 mmHg. 17. No obvious vegetations involving valves or device wire.   Patient Profile     68 y.o. male with nonischemic cardiomyopathy status post ICD, consistent atrial fibrillation, left lower extremity DVT status post IVC filter, recent hemorrhagic stroke, CKD III, admitted with persistent fever and respiratory failure thought to be due to pneumonia.  Cardiology has been assisting with atrial fibrillation with rapid ventricular response.  Assessment & Plan    # Atrial fibrillation with rapid ventricular response: Mr. Busby remains in atrial fibrillation.  Rates are better-controlled.  Continue metoprolol succinate 200 mg daily.  Diltiazem drip is down to 5mg /hr from 15mg  after starting digoxin.  Will increase digoxin to 256mcg daily. Check level on 10/23.  The goal is to maintain HR <100 bpm and stop diltiazem.  Check digoxin level in the AM.  He had a recent hemorrhagic stroke.  Plan to initiate Eliquis once cleared by neurosurgery.  Consider TEE/DCCV once acute illness is resolved if rates remain difficult to control.   # Chronic systolic and diastolic heart failure: LVEF 15-20%.  S/p ICD.   He has LE edema 2/2 DVT.  Continue metoprolol succinate.    His home Delene Loll has been held in the setting of renal dysfunction.  Renal function is stalbe.  We will resume [employer]. Switch lasix to 40mg  po daily.  Try to wean diltiazem as above.   # Sepsis: Source unclear.  Treated empirically for aspirination pneumonia.  No clear micro data.  Thoracentesis fluid pending.  No evidence of hardware infection on TTE.    # DVT: S/p IVC filter. Currently on heparin and will switch to Eliquis when able.        For questions or updates, please contact [place] Please consult [email or web address] for contact info under        Signed, Skeet Latch, MD  12/11/2018, 9:48 AM

## 2018-12-12 ENCOUNTER — Encounter (HOSPITAL_COMMUNITY): Payer: Self-pay | Admitting: General Surgery

## 2018-12-12 ENCOUNTER — Inpatient Hospital Stay (HOSPITAL_COMMUNITY): Payer: Medicare HMO

## 2018-12-12 LAB — HEPARIN LEVEL (UNFRACTIONATED)
Heparin Unfractionated: 0.22 IU/mL — ABNORMAL LOW (ref 0.30–0.70)
Heparin Unfractionated: <0.1 IU/mL — ABNORMAL LOW (ref 0.30–0.70)

## 2018-12-12 LAB — COMPREHENSIVE METABOLIC PANEL
ALT: 24 U/L (ref 0–44)
AST: 24 U/L (ref 15–41)
Albumin: 2.2 g/dL — ABNORMAL LOW (ref 3.5–5.0)
Alkaline Phosphatase: 105 U/L (ref 38–126)
Anion gap: 11 (ref 5–15)
BUN: 24 mg/dL — ABNORMAL HIGH (ref 8–23)
CO2: 25 mmol/L (ref 22–32)
Calcium: 8.6 mg/dL — ABNORMAL LOW (ref 8.9–10.3)
Chloride: 99 mmol/L (ref 98–111)
Creatinine, Ser: 1.15 mg/dL (ref 0.61–1.24)
GFR calc Af Amer: >60 mL/min (ref >60)
GFR calc non Af Amer: >60 mL/min (ref >60)
Glucose, Bld: 95 mg/dL (ref 70–99)
Potassium: 4 mmol/L (ref 3.5–5.1)
Sodium: 135 mmol/L (ref 135–145)
Total Bilirubin: 0.7 mg/dL (ref 0.3–1.2)
Total Protein: 7.3 g/dL (ref 6.5–8.1)

## 2018-12-12 LAB — CBC
HCT: 27.4 % — ABNORMAL LOW (ref 39.0–52.0)
Hemoglobin: 8.9 g/dL — ABNORMAL LOW (ref 13.0–17.0)
MCH: 30.9 pg (ref 26.0–34.0)
MCHC: 32.5 g/dL (ref 30.0–36.0)
MCV: 95.1 fL (ref 80.0–100.0)
Platelets: 359 10*3/uL (ref 150–400)
RBC: 2.88 MIL/uL — ABNORMAL LOW (ref 4.22–5.81)
RDW: 13.2 % (ref 11.5–15.5)
WBC: 8.7 10*3/uL (ref 4.0–10.5)
nRBC: 0 % (ref 0.0–0.2)

## 2018-12-12 LAB — MAGNESIUM
Magnesium: 2.4 mg/dL (ref 1.7–2.4)
Magnesium: 2.3 mg/dL (ref 1.7–2.4)

## 2018-12-12 LAB — DIGOXIN LEVEL: Digoxin Level: 0.5 ng/mL — ABNORMAL LOW (ref 0.8–2.0)

## 2018-12-12 LAB — POTASSIUM: Potassium: 4.3 mmol/L (ref 3.5–5.1)

## 2018-12-12 MED ORDER — DIGOXIN 250 MCG PO TABS
0.3125 mg | ORAL_TABLET | Freq: Every day | ORAL | Status: DC
  Administered 2018-12-13 – 2018-12-14 (×2): 0.3125 mg via ORAL
  Filled 2018-12-12 (×2): qty 1

## 2018-12-12 MED ORDER — MORPHINE SULFATE (PF) 4 MG/ML IV SOLN
INTRAVENOUS | Status: AC
  Filled 2018-12-12: qty 1

## 2018-12-12 MED ORDER — DIGOXIN 125 MCG PO TABS
0.0625 mg | ORAL_TABLET | Freq: Once | ORAL | Status: AC
  Administered 2018-12-12: 15:00:00 0.0625 mg via ORAL
  Filled 2018-12-12: qty 1

## 2018-12-12 MED ORDER — MORPHINE SULFATE (PF) 4 MG/ML IV SOLN
3.0000 mg | Freq: Once | INTRAVENOUS | Status: AC
  Administered 2018-12-12: 14:00:00 3 mg via INTRAVENOUS

## 2018-12-12 MED ORDER — TECHNETIUM TC 99M MEBROFENIN IV KIT
5.4700 | PACK | Freq: Once | INTRAVENOUS | Status: AC | PRN
  Administered 2018-12-12: 5.47 via INTRAVENOUS

## 2018-12-12 NOTE — Progress Notes (Signed)
ANTICOAGULATION CONSULT NOTE - Follow Up Consult  Pharmacy Consult forHeparin Indication: atrial fibrillation (CHADS2VASc= 5), extensive DVT s/p IVC filter  Allergies  Allergen Reactions  . Benadryl [Diphenhydramine] Palpitations    Patient Measurements: Height: 5\' 7"  (170.2 cm) Weight: 182 lb 8.7 oz (82.8 kg) IBW/kg (Calculated) : 66.1 Heparin Dosing Weight: 85 kg   Vital Signs: Temp: 97.8 F (36.6 C) (10/23 0456) Temp Source: Oral (10/23 0456) BP: 139/57 (10/23 1525) Pulse Rate: 91 (10/23 1525)  Labs: Recent Labs    12/10/18 0739  12/11/18 0345 12/11/18 1512 12/11/18 1748 12/12/18 0348 12/12/18 1520  HGB 10.7*   < > 9.0* 9.0*  --  8.9*  --   HCT 32.2*   < > 27.7* 27.9*  --  27.4*  --   PLT 327   < > 363 363  --  359  --   HEPARINUNFRC 0.42   < > <0.10*  --  0.58 0.22* <0.10*  CREATININE 1.36*  --  1.12  --   --  1.15  --    < > = values in this interval not displayed.    Estimated Creatinine Clearance: 65.1 mL/min (by C-G formula based on SCr of 1.15 mg/dL).  Assessment: 88 yoM with rapid Afib (CHADS2VASc= 5) and LLE DVT s/p IVC filter, with hx recent hemorrhagic stroke. Pharmacy cleared to start IV heparin (low goal no bolus) by CCM and neurology with stable head CT on 10/19. Plan to transition to apixaban once cleared by neurosurgery.  Hgb 10.8> 9> 8.9. No bleeding per notes and RN.   HL undetectable. Per RN, heparin infusion was leaking for an unknown amount of time, now infusing normally. Will continue same rate given previously slightly supratherapeutic on 22.110ml/hr.    Goal of Therapy:  Heparin level 0.3-0.5units/ml Monitor platelets by anticoagulation protocol: Yes  Plan: -Continue 2200 units/hr  -Re-check heparin level with AM labs  Benetta Spar, PharmD, BCPS, [facility] Clinical Pharmacist

## 2018-12-12 NOTE — Consult Note (Signed)
Glenn [name] 08/02/1950  [id number].    Requesting MD: Dr. Wendee Beavers Chief Complaint/Reason for Consult: cholecystitis  HPI:  This is a 69 yo black male with a history of HTN, afib on heparin gtt, s/p cardioversion on 10/29/18 which failed to convert him still in a fib on dilt gtt, with ICD and EF of 15-20%, along with Hep C, CKD III, and extensive DVT s/p IVC filter who had a CVA on 11/10/18.  He was able to be discharged on 10/13 to CIR, but was readmitted secondary to progressive RLL PNA likely secondary to aspiration.  He also had some RUQ abdominal pain concerning for cholecystitis.  He underwent an abdominal US that revealed some gallstones with borderline wall thickening at 3.69mm.  His WBC is normal as are his LFTs.  He does admit to some nausea.  We have been asked to see him for further evaluation of his gallbladder.  He is currently on zosyn.  ROS: ROS: Please see HPI, otherwise he does have some dysphagia, hemiparesis.  Other systems are negative.  Family History  Problem Relation Age of Onset   High blood pressure Mother    High blood pressure Father    Stroke Maternal Aunt    Heart disease Neg Hx     Past Medical History:  Diagnosis Date   Atrial fibrillation ([place])    CHF (congestive heart failure) ([place])    Hepatitis C    Hypertension    Stroke [facility])    Visit for monitoring Tikosyn therapy 03/26/2017    Past Surgical History:  Procedure Laterality Date   CARDIAC DEFIBRILLATOR PLACEMENT  2015   CARDIOVERSION N/A 10/10/2016   Procedure: CARDIOVERSION;  Surgeon: Dorothy Spark, MD;  Location: [place];  Service: Cardiovascular;  Laterality: N/A;   CARDIOVERSION N/A 03/27/2017   Procedure: CARDIOVERSION;  Surgeon: Jerline Pain, MD;  Location: [place];  Service: Cardiovascular;  Laterality: N/A;   CARDIOVERSION N/A 10/29/2018   Procedure: CARDIOVERSION;  Surgeon: Sanda Klein, MD;  Location: [place];  Service: Cardiovascular;   Laterality: N/A;   CARDIOVERSION N/A 11/05/2018   Procedure: CARDIOVERSION;  Surgeon: Nahser, Wonda Cheng, MD;  Location: [place];  Service: Cardiovascular;  Laterality: N/A;   EYE SURGERY Left 1990   IR IVC FILTER PLMT / S&I /IMG GUID/MOD SED  12/04/2018   IR PERCUTANEOUS ART THROMBECTOMY/INFUSION INTRACRANIAL INC DIAG ANGIO  09/05/2016   IR RADIOLOGIST EVAL & MGMT  10/03/2016   RADIOLOGY WITH ANESTHESIA N/A 09/05/2016   Procedure: RADIOLOGY WITH ANESTHESIA;  Surgeon: Luanne Bras, MD;  Location: [place];  Service: Radiology;  Laterality: N/A;   RIGHT/LEFT HEART CATH AND CORONARY ANGIOGRAPHY N/A 11/03/2018   Procedure: RIGHT/LEFT HEART CATH AND CORONARY ANGIOGRAPHY;  Surgeon: Lorretta Harp, MD;  Location: [place] [facility];  Service: Cardiovascular;  Laterality: N/A;    Social History:  reports that he has been smoking cigarettes. He has been smoking about 0.50 packs per day. He has never used smokeless tobacco. He reports previous alcohol use of about 3.0 standard drinks of alcohol per week. He reports current drug use. Frequency: 2.00 times per week. Drug: Marijuana.  Allergies:  Allergies  Allergen Reactions   Benadryl [Diphenhydramine] Palpitations    Medications Prior to Admission  Medication Sig Dispense Refill   acetaminophen (TYLENOL) 325 MG tablet Take 2 tablets (650 mg total) by mouth every 4 (four) hours as needed for mild pain (or temp > 37.5 C (99.5 F)).  atorvastatin (LIPITOR) 20 MG tablet Take 1 tablet (20 mg total) by mouth daily at 6 PM.     carvedilol (COREG) 12.5 MG tablet Take 1 tablet (12.5 mg total) by mouth 2 (two) times daily with a meal.     ceFEPIme 2 g in sodium chloride 0.9 % 100 mL Inject 2 g into the vein every 8 (eight) hours.     ciprofloxacin (CILOXAN) 0.3 % ophthalmic solution Place 1 drop into the left eye 4 (four) times daily. Administer 1 drop, every 2 hours, while awake, for 2 days. Then 1 drop, every 4 hours, while awake, for  the next 5 days. 5 mL 0   divalproex (DEPAKOTE SPRINKLE) 125 MG capsule Take 1 capsule (125 mg total) by mouth every 12 (twelve) hours.     furosemide (LASIX) 40 MG tablet Take 1 tablet (40 mg total) by mouth daily. 30 tablet 0   gabapentin (NEURONTIN) 300 MG capsule TAKE 1 CAPSULE(300 MG) BY MOUTH THREE TIMES DAILY (Patient taking differently: Take 300 mg by mouth 3 (three) times daily. ) 180 capsule 0   lidocaine (LIDODERM) 5 % Place 1 patch onto the skin daily. Remove & Discard patch within 12 hours or as directed by MD 30 patch 0   lidocaine (LMX) 4 % cream Apply topically 3 (three) times daily. 30 g 0   modafinil (PROVIGIL) 100 MG tablet Take 1 tablet (100 mg total) by mouth daily at 6 (six) AM.     potassium chloride SA (K-DUR) 20 MEQ tablet Take 1 tablet (20 mEq total) by mouth daily. 30 tablet 0   sacubitril-valsartan (ENTRESTO) 24-26 MG Take 1 tablet by mouth 2 (two) times daily. 60 tablet 0   spironolactone (ALDACTONE) 25 MG tablet Take 0.5 tablets (12.5 mg total) by mouth daily.     topiramate (TOPAMAX) 25 MG tablet Take 1 tablet (25 mg total) by mouth at bedtime.     traMADol (ULTRAM) 50 MG tablet Take 1 tablet (50 mg total) by mouth every 8 (eight) hours as needed for severe pain. 30 tablet      Physical Exam: Blood pressure (!) 139/57, pulse 91, temperature 97.8 F (36.6 C), temperature source Oral, resp. rate 20, height 5\' 7"  (1.702 m), weight 82.8 kg, SpO2 97 %. General: pleasant, WD, WN black male who is laying in bed in NAD HEENT: head is normocephalic, atraumatic.  Sclera are noninjected.  PERRL.  Ears and nose without any masses or lesions.  Mouth is pink and moist Heart: irregular, Normal s1,s2. No obvious murmurs, gallops, or rubs noted.  Palpable radial and pedal pulses bilaterally Lungs: CTAB, no wheezes, rhonchi, or rales noted.  Respiratory effort nonlabored Abd: soft, tender in RUQ, ND, +BS, no masses, hernias, or organomegaly Skin: warm and dry with no  masses, lesions, or rashes Psych: A&Ox3 with an appropriate affect.   Results for orders placed or performed during the hospital encounter of 12/02/18 (from the past 48 hour(s))  Differential     Status: Abnormal   Collection Time: 12/10/18  5:31 PM  Result Value Ref Range   Neutrophils Relative % 70 %   Neutro Abs 10.3 (H) 1.7 - 7.7 K/uL   Lymphocytes Relative 16 %   Lymphs Abs 2.3 0.7 - 4.0 K/uL   Monocytes Relative 7 %   Monocytes Absolute 1.0 0.1 - 1.0 K/uL   Eosinophils Relative 5 %   Eosinophils Absolute 0.7 (H) 0.0 - 0.5 K/uL   Basophils Relative 0 %   Basophils  Absolute 0.1 0.0 - 0.1 K/uL   Immature Granulocytes 2 %   Abs Immature Granulocytes 0.24 (H) 0.00 - 0.07 K/uL    Comment: Performed at [facility], [place] [address], [place], [place] [address]  CBC     Status: Abnormal   Collection Time: 12/10/18  5:31 PM  Result Value Ref Range   WBC 14.7 (H) 4.0 - 10.5 K/uL   RBC 3.41 (L) 4.22 - 5.81 MIL/uL   Hemoglobin 10.8 (L) 13.0 - 17.0 g/dL   HCT 32.3 (L) 39.0 - 52.0 %   MCV 94.7 80.0 - 100.0 fL   MCH 31.7 26.0 - 34.0 pg   MCHC 33.4 30.0 - 36.0 g/dL   RDW 13.6 11.5 - 15.5 %   Platelets 337 150 - 400 K/uL   nRBC 0.1 0.0 - 0.2 %    Comment: Performed at [facility], [place] [address], [place], [place] [address]  Comprehensive metabolic panel     Status: Abnormal   Collection Time: 12/11/18  3:45 AM  Result Value Ref Range   Sodium 135 135 - 145 mmol/L   Potassium 3.9 3.5 - 5.1 mmol/L   Chloride 99 98 - 111 mmol/L   CO2 26 22 - 32 mmol/L   Glucose, Bld 108 (H) 70 - 99 mg/dL   BUN 30 (H) 8 - 23 mg/dL   Creatinine, Ser 1.12 0.61 - 1.24 mg/dL   Calcium 8.3 (L) 8.9 - 10.3 mg/dL   Total Protein 6.7 6.5 - 8.1 g/dL   Albumin 2.0 (L) 3.5 - 5.0 g/dL   AST 32 15 - 41 U/L   ALT 29 0 - 44 U/L   Alkaline Phosphatase 102 38 - 126 U/L   Total Bilirubin 0.9 0.3 - 1.2 mg/dL   GFR calc non Af Amer >60 >60 mL/min   GFR calc Af Amer >60 >60 mL/min   Anion gap 10 5 - 15     Comment: Performed at [facility], [address] [place], [place] [address]  Magnesium     Status: None   Collection Time: 12/11/18  3:45 AM  Result Value Ref Range   Magnesium 2.3 1.7 - 2.4 mg/dL    Comment: Performed at [facility] [address], [place], [place] [address]  CBC     Status: Abnormal   Collection Time: 12/11/18  3:45 AM  Result Value Ref Range   WBC 10.1 4.0 - 10.5 K/uL   RBC 2.92 (L) 4.22 - 5.81 MIL/uL   Hemoglobin 9.0 (L) 13.0 - 17.0 g/dL   HCT 27.7 (L) 39.0 - 52.0 %   MCV 94.9 80.0 - 100.0 fL   MCH 30.8 26.0 - 34.0 pg   MCHC 32.5 30.0 - 36.0 g/dL   RDW 13.2 11.5 - 15.5 %   Platelets 363 150 - 400 K/uL   nRBC 0.2 0.0 - 0.2 %    Comment: Performed at [facility], [place] [address] [place], [place] [address]  Heparin level (unfractionated)     Status: Abnormal   Collection Time: 12/11/18  3:45 AM  Result Value Ref Range   Heparin Unfractionated <0.10 (L) 0.30 - 0.70 IU/mL    Comment: (NOTE) If heparin results are below expected values, and patient dosage has  been confirmed, suggest follow up testing of antithrombin III levels. Performed at [facility], [place] [address], [place], [place] [address]   CBC     Status: Abnormal  Collection Time: 12/11/18  3:12 PM  Result Value Ref Range   WBC 10.0 4.0 - 10.5 K/uL   RBC 2.91 (L) 4.22 - 5.81 MIL/uL   Hemoglobin 9.0 (L) 13.0 - 17.0 g/dL   HCT 27.9 (L) 39.0 - 52.0 %   MCV 95.9 80.0 - 100.0 fL   MCH 30.9 26.0 - 34.0 pg   MCHC 32.3 30.0 - 36.0 g/dL   RDW 13.2 11.5 - 15.5 %   Platelets 363 150 - 400 K/uL   nRBC 0.2 0.0 - 0.2 %    Comment: Performed at [facility], [place] [address], [place], [place] [address]  Heparin level (unfractionated)     Status: None   Collection Time: 12/11/18  5:48 PM  Result Value Ref Range   Heparin Unfractionated 0.58 0.30 - 0.70 IU/mL    Comment: (NOTE) If heparin results are below expected values, and patient dosage has  been confirmed, suggest  follow up testing of antithrombin III levels. Performed at [facility], [place] [address] [place], [place] [address]   Heparin level (unfractionated)     Status: Abnormal   Collection Time: 12/12/18  3:48 AM  Result Value Ref Range   Heparin Unfractionated 0.22 (L) 0.30 - 0.70 IU/mL    Comment: (NOTE) If heparin results are below expected values, and patient dosage has  been confirmed, suggest follow up testing of antithrombin III levels. Performed at [facility], [place] [address], [place], [place] [address]   Digoxin level     Status: Abnormal   Collection Time: 12/12/18  3:48 AM  Result Value Ref Range   Digoxin Level 0.5 (L) 0.8 - 2.0 ng/mL    Comment: Performed at [facility], [place] [address], [place], [place] [address]  Comprehensive metabolic panel     Status: Abnormal   Collection Time: 12/12/18  3:48 AM  Result Value Ref Range   Sodium 135 135 - 145 mmol/L   Potassium 4.0 3.5 - 5.1 mmol/L   Chloride 99 98 - 111 mmol/L   CO2 25 22 - 32 mmol/L   Glucose, Bld 95 70 - 99 mg/dL   BUN 24 (H) 8 - 23 mg/dL   Creatinine, Ser 1.15 0.61 - 1.24 mg/dL   Calcium 8.6 (L) 8.9 - 10.3 mg/dL   Total Protein 7.3 6.5 - 8.1 g/dL   Albumin 2.2 (L) 3.5 - 5.0 g/dL   AST 24 15 - 41 U/L   ALT 24 0 - 44 U/L   Alkaline Phosphatase 105 38 - 126 U/L   Total Bilirubin 0.7 0.3 - 1.2 mg/dL   GFR calc non Af Amer >60 >60 mL/min   GFR calc Af Amer >60 >60 mL/min   Anion gap 11 5 - 15    Comment: Performed at [facility] [address] [place], [place] [address]  CBC     Status: Abnormal   Collection Time: 12/12/18  3:48 AM  Result Value Ref Range   WBC 8.7 4.0 - 10.5 K/uL   RBC 2.88 (L) 4.22 - 5.81 MIL/uL   Hemoglobin 8.9 (L) 13.0 - 17.0 g/dL   HCT 27.4 (L) 39.0 - 52.0 %   MCV 95.1 80.0 - 100.0 fL   MCH 30.9 26.0 - 34.0 pg   MCHC 32.5 30.0 - 36.0 g/dL   RDW 13.2 11.5 - 15.5 %   Platelets 359 150 - 400 K/uL   nRBC 0.0 0.0 - 0.2 %  Comment: Performed at [facility], [place] [address] [place], [place] [address]  Magnesium     Status: None   Collection Time: 12/12/18  3:48 AM  Result Value Ref Range   Magnesium 2.4 1.7 - 2.4 mg/dL    Comment: Performed at [facility] [address] [place], [place] [address]   Ct Chest Wo Contrast  Result Date: 12/11/2018 CLINICAL DATA:  Hypoxemia respiratory failure EXAM: CT CHEST WITHOUT CONTRAST TECHNIQUE: Multidetector CT imaging of the chest was performed following the standard protocol without IV contrast. COMPARISON:  Multiple chest x-rays.  Abdominal CT from 9 days ago FINDINGS: Cardiovascular: Cardiomegaly with ICD lead into the right ventricular apex. No pericardial effusion. Aortic and coronary atherosclerosis. Mediastinum/Nodes: Negative for adenopathy or inflammation Lungs/Pleura: Consolidation in the right lower lobe with a degree of volume loss, progressed. Small layering right pleural effusion. Mild atelectatic opacity in the dependent left lower lobe. No pulmonary edema Upper Abdomen: Fat edema around the gallbladder which likely has a thickened wall. There is cholelithiasis. Musculoskeletal: No acute or aggressive finding IMPRESSION: 1. Right lower lobe pneumonia with superimposed atelectasis, progressed from abdominal CT 9 days ago. Small right pleural effusion that is layering. 2. Gallstones and new pericholecystic edema, please correlate for symptoms of cholecystitis. 3. Cardiomegaly without edema. Electronically Signed   By: Monte Fantasia M.D.   On: 12/11/2018 04:08   US Abdomen Limited Ruq  Result Date: 12/11/2018 CLINICAL DATA:  Right upper quadrant pain for 8-10 months EXAM: ULTRASOUND ABDOMEN LIMITED RIGHT UPPER QUADRANT COMPARISON:  CT chest 12/11/2018 FINDINGS: Gallbladder: The gallbladder is distended. The wall of the gallbladder is thickened measuring 3.9 mm. There is pericholecystic fluid. Sludge and stones identified within the gallbladder. The largest gallstone measures 2 cm.  Negative sonographic Murphy's sign. Common bile duct: Diameter: 5.1 mm Liver: Increased parenchymal echogenicity. No focal liver abnormality. Portal vein is patent on color Doppler imaging with normal direction of blood flow towards the liver. Other: Perihepatic ascites IMPRESSION: 1. Gallstones, gallbladder sludge and wall thickening concerning for acute cholecystitis. 2. Echogenic liver compatible with hepatic steatosis. Electronically Signed   By: Kerby Moors M.D.   On: 12/11/2018 16:44      Assessment/Plan MMP - see above for list  RUQ abdominal pain, ? Cholecystitis The patient does have some RUQ pain, but also has a RLL PNA which can at times cause referred pain to the RUQ.  His LFTs are normal as is his WBC.  A HIDA scan has been ordered to confirm if he has cholecystitis or not.  It just finished and is not read yet, but appears positive for cholecystitis as I do not see any filling of his gallbladder, but filling of his ducts and intestines.  Given his multitude of medical problems he is a poor surgical candidate.  If this comes back positive, then we would recommend IR consult for placement of a percutaneous cholecystostomy drain.  It is unclear if he will ever be a surgical candidate so this tube could potentially be in place long-term.  We will await placement and recovery after 6-8 weeks to ultimately make that decision.  If his HIDA returns normal by some chance, then no further intervention warranted.  FEN - D I diet VTE - heparin gtt ID - [email or web address]  Henreitta Cea, [facility] Surgery 12/12/2018, 3:57 PM Please see Amion for pager number during day hours 7:00am-4:30pm

## 2018-12-12 NOTE — Progress Notes (Signed)
Tele reported 26 beats run of VT.  Pt was sleeping in bed.  He denied any symptoms of VT. Cardiology made aware.  Idolina Primer, RN

## 2018-12-12 NOTE — Progress Notes (Signed)
OT Cancellation Note  Patient Details Name: [name] MRN: [id number] DOB: Oct 10, 1950   Cancelled Treatment:    Reason Eval/Treat Not Completed: Patient at procedure or test/ unavailable(Pt in nuclear med. Will continue to follow.)  Malka So 12/12/2018, 1:44 PM  Nestor Lewandowsky, OTR/L Acute Rehabilitation Services Pager: [phone] Office: [phone]

## 2018-12-12 NOTE — Progress Notes (Signed)
PROGRESS NOTE  Glenn [name] [id number] DOB: [birth year]   PCP: [name], MD  Patient is from: [facility]. lives with friends before hospitalization.  DOA: 12/02/2018 LOS: 10  Brief Narrative / Interim history: 68 year old male with history of HTN, HLD, A. fib previously on Eliquis, EF 15-20% s/p ICD and cardioversion 10/29/2018, Hep C, CKD-III, recently hospitalized 9/21-10/13 for right MCA subacute hemorrhagic CVA and discharged to [facility]. He returns the same date with A. fib with RVR and abdominal pain. Admitted to ICU for A. fib with RVR, sepsis likely due to aspiration pneumonia, AoC CHF, extensive DVT with IVC placement.  He is nowon room air. Still full code.  Cardiology following.  TRH reassumed care on 12/10/2018.  Repeat CT chest without contrast on 10/21 with progressive RLL pneumonia and possible cholecystitis.  RUQ ultrasound 10/22 concerning for cholecystitis.  General surgery consulted and recommended HIDA scan.  Subjective: No major events overnight or this morning.  No complaints this morning.  Denies chest pain, dyspnea, nausea, vomiting or abdominal pain.  Objective: Vitals:   12/12/18 0819 12/12/18 0955 12/12/18 1523 12/12/18 1525  BP:    (!) 139/57  Pulse:  83 88 91  Resp:      Temp:      TempSrc:      SpO2: 97%     Weight:      Height:        Intake/Output Summary (Last 24 hours) at 12/12/2018 1647 Last data filed at 12/12/2018 1208 Gross per 24 hour  Intake -  Output 1400 ml  Net -1400 ml   Filed Weights   12/09/18 0500 12/10/18 0500 12/12/18 0500  Weight: 85.5 kg 84.3 kg 82.8 kg    Examination:  GENERAL: Chronically ill-appearing.  No acute distress. HEENT: MMM.  Vision and hearing grossly intact.  NECK: Supple.  No apparent JVD.  RESP:  No IWOB.  Fair air movement bilaterally.  Rhonchi bilaterally. CVS:  RRR. Heart sounds normal.  ABD/GI/GU: Bowel sounds present. Soft.  RUQ tenderness.  MSK/EXT: LLE edema.  Left hemiparesis. SKIN:  no apparent skin lesion or wound NEURO: Awake oriented to self, place, year and the president. PSYCH: Calm.   GENERAL: Chronically ill-appearing.  No acute distress. HEENT: MMM.  Vision and hearing grossly intact.  PERRL.  Mild left facial droop. NECK: Supple.  No apparent JVD.  RESP:  No IWOB.  Fair air movement bilaterally. CVS: Irregular.  Normal rate. Heart sounds normal.  ABD/GI/GU: Bowel sounds present. Soft.  RUQ tenderness MSK/EXT:  Left hemiparesis SKIN: no apparent skin lesion or wound NEURO: Awake, alert and oriented appropriately.  Left hemiparesis and facial droop PSYCH: Calm. Normal affect.   Assessment & Plan: Sepsis: Likely due to RLL pneumonia/aspiration pneumonia and possible cholecystitis. CT chest without contrast on 10/22 reveals worsening RLL infiltrate and possible cholecystitis. Blood culture 10/16 with coag negative staph in 1 out of 2 bottles likely contaminant.  MRSA PCR negative.  Last fever 101.6 on 10/19.  Leukocytosis resolved. -10/8 ceftriaxone completed -10/13 zosyn >> will discontinue if HIDA negative. -10/13 vanc > 10/18 -Discussed with infectious disease, Dr. Darryll Capers will continue Zosyn until good source control  Possible cholecystitis: Patient with RUQ tenderness and Murphy's signs.  CT chest and RUQ ultrasound suggestive for cholecystitis.  Liver enzymes within normal range. -IV antibiotics as above-will discontinue if HIDA is negative. -General surgery consulted-recommended HIDA scan, and IR consult if HIDA is positive.  A. fib with RVR: Heart rate in 90s.  Had 42 beats  of V. tach overnight but not symptomatic.   -Cardiology managing. -On digoxin, Cardizem, high-dose metoprolol with as needed -On heparin for anticoagulation-eventually transition to Eliquis after work-up for possible cholecystitis.  Acute on chronic systolic CHF: Echo [id number] with LVEF 15-20%.  ICD status. -IV Lasix per cardiology -Plan to initiate Entresto after HIDA scan.  -Monitor fluid status, renal function and electrolytes and renal  Extensive DVT s/p IVC filter -Continue heparin -Eventually transition to Eliquis. -IR to reassess for filter retrieval in about 8 to 12 weeks.  Acute respiratory failure with hypoxia: Multifactorial including sepsis, pneumonia, A. fib with RVR and CHF.  Improving. -Wean supplemental oxygen as above -Continue bronchodilators -Antibiotics as above -BiPAP at night  Right pleural effusion: Noted on CXR and CT abdomen and pelvis.  Reported are small on repeat CT 10/21.  -Treat underlying pneumonia  History of MCA hemorrhagic stroke with residual left hemiparesis/dysphagia: Hospitalized for this 9/21-10/13 and discharged to [facility]. Returns with A. fib with RVR and sepsis as above -CT head 10/18-expected evolution of BG hemorrhage and prominent anterior right MCA infarct.  Resolved lesion of IVH -Closely monitor while on heparin -PT/OT.  Normocytic anemia: Hgb dropped to gram overnight.  Likely a combination of acute illness, blood draws and chronic illness.  No obvious bleeding.  No hematochezia or melena per RN. -Continue monitoring  History of hepatitis C -Outpatient follow-up  CKD-3: Stable.  Baseline Cr1.2-1.4.  -Continue monitoring while on diuretics  Dysphagia -Dysphagia 1 diet per SLP recommendation. -MBS today.  Malnutrition Type/Characteristics and interventions: Nutrition Problem: Inadequate oral intake Etiology: inability to eat  Signs/Symptoms: NPO status  Interventions: Ensure Enlive (each supplement provides 350kcal and 20 grams of protein), MVI, Magic cup, Hormel Shake   Goal of care discussion:  10/22-discussed about the CODE STATUS.  Patient defers decision to his son, [name].  Reached out to [name] over the phone and discussed the risk and benefits of CPR/intubation. [name] says, "do what it takes to keep my father alive".   DVT prophylaxis: On heparin drip Code Status: Full code  Family Communication: Updated patient's son over the phone 10/22. Disposition Plan: Remains inpatient. Consultants: PCCM, cardiology, ID (over the phone), general surgery  Procedures:  None  Microbiology summarized: MRSA PCR negative Exploited sputum not acceptable for culture Blood culture with coag negative staph in 1 out of 2 bottles Previous blood cultures negative.  Sch Meds:  Scheduled Meds: . chlorhexidine  15 mL Mouth Rinse BID  . Chlorhexidine Gluconate Cloth  6 each Topical Daily  . [START ON 12/13/2018] digoxin  0.3125 mg Oral Daily  . feeding supplement (ENSURE ENLIVE)  237 mL Oral TID BM  . furosemide  40 mg Oral Daily  . gabapentin  300 mg Oral Q8H  . ipratropium  0.5 mg Nebulization BID  . levalbuterol  0.63 mg Nebulization BID  . mouth rinse  15 mL Mouth Rinse q12n4p  . metoprolol succinate  200 mg Oral Daily  . morphine      . multivitamin with minerals  1 tablet Oral Daily   Continuous Infusions: . sodium chloride Stopped (12/10/18 1036)  . diltiazem (CARDIZEM) infusion 5 mg/hr (12/12/18 1633)  . heparin 2,200 Units/hr (12/12/18 1116)  . piperacillin-tazobactam (ZOSYN)  IV 3.375 g (12/12/18 1536)   PRN Meds:.sodium chloride, acetaminophen, bisacodyl, calcium carbonate, hydrALAZINE, metoprolol tartrate, morphine injection  Antimicrobials: Anti-infectives (From admission, onward)   Start     Dose/Rate Route Frequency Ordered Stop   12/07/18 0800  vancomycin (VANCOCIN)  1,500 mg in sodium chloride 0.9 % 500 mL IVPB  Status:  Discontinued     1,500 mg 250 mL/hr over 120 Minutes Intravenous Every 24 hours 12/06/18 1206 12/07/18 0931   12/06/18 0330  vancomycin (VANCOCIN) 1,500 mg in sodium chloride 0.9 % 500 mL IVPB     1,500 mg 250 mL/hr over 120 Minutes Intravenous  Once 12/06/18 0323 12/06/18 0557   12/03/18 2200  vancomycin (VANCOCIN) 1,500 mg in sodium chloride 0.9 % 500 mL IVPB  Status:  Discontinued     1,500 mg 250 mL/hr over 120 Minutes Intravenous  Every 24 hours 12/02/18 2115 12/03/18 0850   12/03/18 2100  vancomycin (VANCOCIN) 1,250 mg in sodium chloride 0.9 % 250 mL IVPB  Status:  Discontinued     1,250 mg 166.7 mL/hr over 90 Minutes Intravenous Every 24 hours 12/03/18 0850 12/03/18 1009   12/02/18 2100  piperacillin-tazobactam (ZOSYN) IVPB 3.375 g     3.375 g 12.5 mL/hr over 240 Minutes Intravenous Every 8 hours 12/02/18 2040     12/02/18 2045  vancomycin (VANCOCIN) 1,750 mg in sodium chloride 0.9 % 500 mL IVPB     1,750 mg 250 mL/hr over 120 Minutes Intravenous  Once 12/02/18 2040 12/03/18 0100       I have personally reviewed the following labs and images: CBC: Recent Labs  Lab 12/10/18 0739 12/10/18 1731 12/11/18 0345 12/11/18 1512 12/12/18 0348  WBC 14.5* 14.7* 10.1 10.0 8.7  NEUTROABS  --  10.3*  --   --   --   HGB 10.7* 10.8* 9.0* 9.0* 8.9*  HCT 32.2* 32.3* 27.7* 27.9* 27.4*  MCV 93.9 94.7 94.9 95.9 95.1  PLT 327 337 363 363 359   BMP &GFR Recent Labs  Lab 12/07/18 0157 12/09/18 0324 12/10/18 0739 12/11/18 0345 12/12/18 0348  NA 139 135 136 135 135  K 4.4 4.3 3.7 3.9 4.0  CL 103 95* 95* 99 99  CO2 24 24 27 26 25   GLUCOSE 140* 114* 115* 108* 95  BUN 25* 35* 39* 30* 24*  CREATININE 1.12 1.45* 1.36* 1.12 1.15  CALCIUM 8.3* 8.5* 8.7* 8.3* 8.6*  MG 2.1 2.2 2.6* 2.3 2.4  PHOS  --  3.9 3.7  --   --    Estimated Creatinine Clearance: 65.1 mL/min (by C-G formula based on SCr of 1.15 mg/dL). Liver & Pancreas: Recent Labs  Lab 12/11/18 0345 12/12/18 0348  AST 32 24  ALT 29 24  ALKPHOS 102 105  BILITOT 0.9 0.7  PROT 6.7 7.3  ALBUMIN 2.0* 2.2*   No results for input(s): LIPASE, AMYLASE in the last 168 hours. No results for input(s): AMMONIA in the last 168 hours. Diabetic: No results for input(s): HGBA1C in the last 72 hours. Recent Labs  Lab 12/09/18 2355 12/10/18 0347 12/10/18 0732 12/10/18 1119 12/10/18 1514  GLUCAP 128* 107* 109* 118* 108*   Cardiac Enzymes: No results for input(s):  CKTOTAL, CKMB, CKMBINDEX, TROPONINI in the last 168 hours. No results for input(s): PROBNP in the last 8760 hours. Coagulation Profile: No results for input(s): INR, PROTIME in the last 168 hours. Thyroid Function Tests: No results for input(s): TSH, T4TOTAL, FREET4, T3FREE, THYROIDAB in the last 72 hours. Lipid Profile: No results for input(s): CHOL, HDL, LDLCALC, TRIG, CHOLHDL, LDLDIRECT in the last 72 hours. Anemia Panel: No results for input(s): VITAMINB12, FOLATE, FERRITIN, TIBC, IRON, RETICCTPCT in the last 72 hours. Urine analysis:    Component Value Date/Time   COLORURINE AMBER (A) 12/05/2018 [id number]  APPEARANCEUR HAZY (A) 12/05/2018 0227   LABSPEC 1.040 (H) 12/05/2018 0227   PHURINE 5.0 12/05/2018 0227   GLUCOSEU NEGATIVE 12/05/2018 0227   HGBUR SMALL (A) 12/05/2018 0227   BILIRUBINUR NEGATIVE 12/05/2018 0227   KETONESUR NEGATIVE 12/05/2018 0227   PROTEINUR 100 (A) 12/05/2018 0227   NITRITE NEGATIVE 12/05/2018 0227   LEUKOCYTESUR NEGATIVE 12/05/2018 0227   Sepsis Labs: Invalid input(s): PROCALCITONIN, [place]  Microbiology: Recent Results (from the past 240 hour(s))  Urine Culture     Status: None   Collection Time: 12/02/18  9:45 PM   Specimen: Urine, Catheterized  Result Value Ref Range Status   Specimen Description URINE, CATHETERIZED  Final   Special Requests NONE  Final   Culture   Final    NO GROWTH Performed at [facility], [address] [place], [place] [address]    Report Status 12/04/2018 FINAL  Final  MRSA PCR Screening     Status: None   Collection Time: 12/02/18  9:54 PM   Specimen: Nasal Mucosa; Nasopharyngeal  Result Value Ref Range Status   MRSA by PCR NEGATIVE NEGATIVE Final    Comment:        The GeneXpert MRSA Assay (FDA approved for NASAL specimens only), is one component of a comprehensive MRSA colonization surveillance program. It is not intended to diagnose MRSA infection nor to guide or monitor treatment for MRSA  infections. Performed at [facility], [place] [address], [place], [place] [address]   Culture, blood (routine x 2)     Status: None   Collection Time: 12/05/18  4:06 AM   Specimen: BLOOD LEFT HAND  Result Value Ref Range Status   Specimen Description BLOOD LEFT HAND  Final   Special Requests   Final    BOTTLES DRAWN AEROBIC ONLY Blood Culture adequate volume   Culture   Final    NO GROWTH 5 DAYS Performed at [facility], [place] [address], [place], [place] [address]    Report Status 12/10/2018 FINAL  Final  Culture, blood (routine x 2)     Status: Abnormal   Collection Time: 12/05/18  4:06 AM   Specimen: BLOOD RIGHT HAND  Result Value Ref Range Status   Specimen Description BLOOD RIGHT HAND  Final   Special Requests   Final    BOTTLES DRAWN AEROBIC ONLY Blood Culture results may not be optimal due to an inadequate volume of blood received in culture bottles   Culture  Setup Time   Final    AEROBIC BOTTLE ONLY GRAM POSITIVE COCCI CRITICAL RESULT CALLED TO, READ BACK BY AND VERIFIED WITH: V BRYK PHARMD 12/06/18 0315 JDW    Culture (A)  Final    STAPHYLOCOCCUS SPECIES (COAGULASE NEGATIVE) THE SIGNIFICANCE OF ISOLATING THIS ORGANISM FROM A SINGLE SET OF BLOOD CULTURES WHEN MULTIPLE SETS ARE DRAWN IS UNCERTAIN. PLEASE NOTIFY THE MICROBIOLOGY DEPARTMENT WITHIN ONE WEEK IF SPECIATION AND SENSITIVITIES ARE REQUIRED. Performed at [facility], [place] [address], [place], [place] [address]    Report Status 12/07/2018 FINAL  Final  Expectorated sputum assessment w rflx to resp cult     Status: None   Collection Time: 12/06/18  9:06 PM   Specimen: SPU  Result Value Ref Range Status   Specimen Description SPUTUM  Final   Special Requests NONE  Final   Sputum evaluation   Final    Sputum specimen not acceptable for testing.  Please recollect.   RESULT CALLED TO, READ BACK  BY AND VERIFIED WITH: RN York Cerise CRUZ [id number] FCP Performed at [place] [address]  [place], [place] [address]    Report Status 12/07/2018 FINAL  Final    Radiology Studies: Nm Hepatobiliary Liver Func  Result Date: 12/12/2018 CLINICAL DATA:  Right upper quadrant pain.  Cholecystitis suspected. EXAM: NUCLEAR MEDICINE HEPATOBILIARY IMAGING TECHNIQUE: Sequential images of the abdomen were obtained out to 60 minutes following intravenous administration of radiopharmaceutical. RADIOPHARMACEUTICALS:  5.47 mCi Tc-15m  Choletec IV COMPARISON:  Ultrasound 12/11/2018 FINDINGS: Prompt uptake and biliary excretion of activity by the liver is seen. Biliary activity passes into small bowel, consistent with patent common bile duct. No gallbladder activity visualized within the first hour. The patient then received 3 mg of morphine the intravenous infusion and subsequent imaging was carried out for an additional 30 minutes. No gallbladder activity noted post morphine. IMPRESSION: 1. Nonvisualization of the gallbladder which may reflect cystic duct obstruction and acute cholecystitis. Electronically Signed   By: Kerby Moors M.D.   On: 12/12/2018 16:39   Akshaya Toepfer T. McClelland  If 7PM-7AM, please contact night-coverage [email or web address] Password [facility] 12/12/2018, 4:47 PM

## 2018-12-12 NOTE — Progress Notes (Deleted)
CSW attempted to meet with the patient at bedside to complete assessment. The patient was at a procedure. CSW will attempt to meet with the patient at a later time.   CSW will continue to follow.   Domenic Schwab, MSW, [place] Worker [facility]  [phone]

## 2018-12-12 NOTE — Progress Notes (Signed)
Progress Note  Patient Name: Glenn [name] Date of Encounter: 12/12/2018  Primary Cardiologist: Virl Axe, MD   Subjective   Complains of L hip and abdominal pain.  Otherwise well.   Inpatient Medications    Scheduled Meds:  chlorhexidine  15 mL Mouth Rinse BID   Chlorhexidine Gluconate Cloth  6 each Topical Daily   digoxin  0.25 mg Oral Daily   feeding supplement (ENSURE ENLIVE)  237 mL Oral TID BM   furosemide  40 mg Oral Daily   gabapentin  300 mg Oral Q8H   ipratropium  0.5 mg Nebulization BID   levalbuterol  0.63 mg Nebulization BID   mouth rinse  15 mL Mouth Rinse q12n4p   metoprolol succinate  200 mg Oral Daily   multivitamin with minerals  1 tablet Oral Daily   sacubitril-valsartan  1 tablet Oral BID   Continuous Infusions:  sodium chloride Stopped (12/10/18 1036)   diltiazem (CARDIZEM) infusion 5 mg/hr (12/11/18 1221)   heparin 2,200 Units/hr (12/12/18 0643)   piperacillin-tazobactam (ZOSYN)  IV 3.375 g (12/12/18 0529)   PRN Meds: sodium chloride, acetaminophen, bisacodyl, calcium carbonate, hydrALAZINE, metoprolol tartrate, morphine injection   Vital Signs    Vitals:   12/12/18 0456 12/12/18 0500 12/12/18 0819 12/12/18 0955  BP: 134/69     Pulse: 91   83  Resp: 20     Temp: 97.8 F (36.6 C)     TempSrc: Oral     SpO2: 100%  97%   Weight:  82.8 kg    Height:        Intake/Output Summary (Last 24 hours) at 12/12/2018 0958 Last data filed at 12/12/2018 0949 Gross per 24 hour  Intake --  Output 2525 ml  Net -2525 ml   Last 3 Weights 12/12/2018 12/10/2018 12/09/2018  Weight (lbs) 182 lb 8.7 oz 185 lb 13.6 oz 188 lb 7.9 oz  Weight (kg) 82.8 kg 84.3 kg 85.5 kg      Telemetry    Atrial fibrillation.  Rate <100 bpm. PVCs.  NSVT vs afib with aberrancy 9 beats  - Personally Reviewed  ECG    n/a - Personally Reviewed  Physical Exam   VS:  BP 134/69 (BP Location: Left Arm)    Pulse 83    Temp 97.8 F (36.6 C) (Oral)     Resp 20    Ht 5\' 7"  (1.702 m)    Wt 82.8 kg    SpO2 97%    BMI 28.59 kg/m  , BMI Body mass index is 28.59 kg/m. GENERAL: Ill -appearing HEENT: Pupils equal round and reactive, fundi not visualized, oral mucosa unremarkable.  R facial droop NECK: No jugular venous distention, waveform within normal limits, carotid upstroke brisk and symmetric, no bruits LUNGS:  Clear to auscultation bilaterally HEART: Irregularly irregular.  PMI not displaced or sustained,S1 and S2 within normal limits, no S3, no S4, no clicks, no rubs, no murmurs ABD:  Flat, positive bowel sounds normal in frequency in pitch, no bruits, no rebound, no guarding, no midline pulsatile mass, no hepatomegaly, no splenomegaly EXT:  2 plus pulses throughout, 2+ L LE edema, no R LE edema,  Bilateral UE edema.  No cyanosis no clubbing SKIN:  No rashes no nodules NEURO:  L hemiparesis. PSYCH:  Cognitively intact, oriented to person place and time   Labs    High Sensitivity Troponin:   Recent Labs  Lab 12/02/18 2209 12/03/18 0225  TROPONINIHS 26* 25*  Chemistry Recent Labs  Lab 12/10/18 0739 12/11/18 0345 12/12/18 0348  NA 136 135 135  K 3.7 3.9 4.0  CL 95* 99 99  CO2 27 26 25   GLUCOSE 115* 108* 95  BUN 39* 30* 24*  CREATININE 1.36* 1.12 1.15  CALCIUM 8.7* 8.3* 8.6*  PROT  --  6.7 7.3  ALBUMIN  --  2.0* 2.2*  AST  --  32 24  ALT  --  29 24  ALKPHOS  --  102 105  BILITOT  --  0.9 0.7  GFRNONAA 54* >60 >60  GFRAA >60 >60 >60  ANIONGAP 14 10 11      Hematology Recent Labs  Lab 12/11/18 0345 12/11/18 1512 12/12/18 0348  WBC 10.1 10.0 8.7  RBC 2.92* 2.91* 2.88*  HGB 9.0* 9.0* 8.9*  HCT 27.7* 27.9* 27.4*  MCV 94.9 95.9 95.1  MCH 30.8 30.9 30.9  MCHC 32.5 32.3 32.5  RDW 13.2 13.2 13.2  PLT 363 363 359    BNP Recent Labs  Lab 12/05/18 1029  BNP 450.0*     DDimer No results for input(s): DDIMER in the last 168 hours.   Radiology    Ct Chest Wo Contrast  Result Date:  12/11/2018 CLINICAL DATA:  Hypoxemia respiratory failure EXAM: CT CHEST WITHOUT CONTRAST TECHNIQUE: Multidetector CT imaging of the chest was performed following the standard protocol without IV contrast. COMPARISON:  Multiple chest x-rays.  Abdominal CT from 9 days ago FINDINGS: Cardiovascular: Cardiomegaly with ICD lead into the right ventricular apex. No pericardial effusion. Aortic and coronary atherosclerosis. Mediastinum/Nodes: Negative for adenopathy or inflammation Lungs/Pleura: Consolidation in the right lower lobe with a degree of volume loss, progressed. Small layering right pleural effusion. Mild atelectatic opacity in the dependent left lower lobe. No pulmonary edema Upper Abdomen: Fat edema around the gallbladder which likely has a thickened wall. There is cholelithiasis. Musculoskeletal: No acute or aggressive finding IMPRESSION: 1. Right lower lobe pneumonia with superimposed atelectasis, progressed from abdominal CT 9 days ago. Small right pleural effusion that is layering. 2. Gallstones and new pericholecystic edema, please correlate for symptoms of cholecystitis. 3. Cardiomegaly without edema. Electronically Signed   By: Monte Fantasia M.D.   On: 12/11/2018 04:08   US Abdomen Limited Ruq  Result Date: 12/11/2018 CLINICAL DATA:  Right upper quadrant pain for 8-10 months EXAM: ULTRASOUND ABDOMEN LIMITED RIGHT UPPER QUADRANT COMPARISON:  CT chest 12/11/2018 FINDINGS: Gallbladder: The gallbladder is distended. The wall of the gallbladder is thickened measuring 3.9 mm. There is pericholecystic fluid. Sludge and stones identified within the gallbladder. The largest gallstone measures 2 cm. Negative sonographic Murphy's sign. Common bile duct: Diameter: 5.1 mm Liver: Increased parenchymal echogenicity. No focal liver abnormality. Portal vein is patent on color Doppler imaging with normal direction of blood flow towards the liver. Other: Perihepatic ascites IMPRESSION: 1. Gallstones, gallbladder  sludge and wall thickening concerning for acute cholecystitis. 2. Echogenic liver compatible with hepatic steatosis. Electronically Signed   By: Kerby Moors M.D.   On: 12/11/2018 16:44    Cardiac Studies   Echo 12/07/18: IMPRESSIONS  1. Left ventricular ejection fraction, by visual estimation, is 20%. The left ventricle has severely decreased function. Mildly increased left ventricular size. There is mildly increased left ventricular hypertrophy.  2. Left ventricular diastolic Doppler parameters are indeterminate in the setting of rapid atrial fibrillation.  3. Global right ventricle has mildly reduced systolic function.The right ventricular size is normal. No increase in right ventricular wall thickness.  4. Left atrial size  was mild-moderately dilated.  5. Right atrial size was normal.  6. Trivial pericardial effusion is present.  7. The pericardial effusion is posterior to the left ventricle.  8. Mild aortic valve annular calcification.  9. The mitral valve is grossly normal. Mild mitral valve regurgitation. 10. The tricuspid valve is grossly normal. Tricuspid valve regurgitation mild-moderate. 11. The aortic valve is tricuspid Aortic valve regurgitation is mild to moderate by color flow Doppler. 12. The pulmonic valve was grossly normal. Pulmonic valve regurgitation is mild by color flow Doppler. 13. Moderately elevated pulmonary artery systolic pressure. 14. The inferior vena cava is normal in size with greater than 50% respiratory variability, suggesting right atrial pressure of 3 mmHg. 15. A device wire is visualized. 16. The tricuspid regurgitant velocity is 3.68 m/s, and with an assumed right atrial pressure of 3 mmHg, the estimated right ventricular systolic pressure is moderately elevated at 57.2 mmHg. 17. No obvious vegetations involving valves or device wire.   Patient Profile     67 y.o. male with nonischemic cardiomyopathy status post ICD, consistent atrial  fibrillation, left lower extremity DVT status post IVC filter, recent hemorrhagic stroke, CKD III, admitted with persistent fever and respiratory failure thought to be due to pneumonia.  Cardiology has been assisting with atrial fibrillation with rapid ventricular response.  Assessment & Plan    # Atrial fibrillation with rapid ventricular response: Mr. Sandefer remains in atrial fibrillation.  Rates are better-controlled but he is still on a diltiazem drip.  Digoxin level 0.5.  We will increase digoxin to 0.375 mg.  Repeat level in 3 days.  Attempt to wean diltiazem drip.  Continue metoprolol succinate 200 mg daily.    He had a recent hemorrhagic stroke.  Plan to initiate Eliquis once cleared by neurosurgery and pending the need for additional procedures.  Consider TEE/DCCV once acute illness is resolved if rates remain difficult to control.   # Chronic systolic and diastolic heart failure: LVEF 15-20%.  S/p ICD.  He has LE edema 2/2 DVT.  Continue metoprolol succinate.    His home Delene Loll has been held in the setting of renal dysfunction.  Renal function is stable.  Hold on starting Entresto given that he is now npo and going for HIDA. Continue lasix.  Try to wean diltiazem as above.   # Sepsis: Source unclear.  Treated empirically for aspirination pneumonia.  No clear micro data.  Thoracentesis fluid pending.  No evidence of hardware infection on TTE.    # DVT: S/p IVC filter. Currently on heparin and will switch to Eliquis when able.        For questions or updates, please contact [place] Please consult [email or web address] for contact info under        Signed, Skeet Latch, MD  12/12/2018, 9:58 AM

## 2018-12-12 NOTE — TOC Initial Note (Signed)
Transition of Care [facility]) - Initial/Assessment Note    Patient Details  Name: Glenn [name] MRN: [id number] Date of Birth: [birth year]  Transition of [facility]) CM/SW Contact:    Gelene Mink, [place] Phone Number: 12/12/2018, 4:46 PM  Clinical Narrative:                  CSW met with the patient at bedside. CSW introduced herself and explained her role. CSW shared therapy recommendation. The patient is agreeable to SNF, he stated that he did not have a preference of facility. The patient gave CSW permission to fax him out and provide bed offers.   CSW will fax the patient out and start insurance authorization once a facility has been selected.   CSW will continue to follow.   Expected Discharge Plan: Skilled Nursing Facility Barriers to Discharge: Insurance Authorization, Continued Medical Work up   Patient Goals and CMS Choice Patient states their goals for this hospitalization and ongoing recovery are:: Pt is agreeable to rehab CMS Medicare.gov Compare Post Acute Care list provided to:: Patient Choice offered to / list presented to : Patient  Expected Discharge Plan and Services Expected Discharge Plan: [place] In-house Referral: Clinical Social Work   Post Acute Care Choice: [place] Living arrangements for the past 2 months: Single Family Home                 DME Arranged: N/A DME Agency: NA       HH Arranged: NA [place] Agency: NA        Prior Living Arrangements/Services Living arrangements for the past 2 months: [place] Lives with:: Self Patient language and need for interpreter reviewed:: No Do you feel safe going back to the place where you live?: Yes      Need for Family Participation in Patient Care: No (Comment) Care giver support system in place?: Yes (comment)   Criminal Activity/Legal Involvement Pertinent to Current Situation/Hospitalization: No - Comment as needed  Activities of Daily Living Home  Assistive Devices/Equipment: None ADL Screening (condition at time of admission) Patient's cognitive ability adequate to safely complete daily activities?: No Is the patient deaf or have difficulty hearing?: No Does the patient have difficulty seeing, even when wearing glasses/contacts?: No Does the patient have difficulty concentrating, remembering, or making decisions?: No Patient able to express need for assistance with ADLs?: Yes Does the patient have difficulty dressing or bathing?: No Independently performs ADLs?: Yes (appropriate for developmental age) Does the patient have difficulty walking or climbing stairs?: No Weakness of Legs: Left Weakness of Arms/Hands: Left  Permission Sought/Granted Permission sought to share information with : Case Manager Permission granted to share information with : Yes, Verbal Permission Granted  Share Information with NAME: [name]  Permission granted to share info w AGENCY: All SNF  Permission granted to share info w Relationship: Son     Emotional Assessment Appearance:: Appears younger than stated age Attitude/Demeanor/Rapport: Engaged Affect (typically observed): Calm Orientation: : Oriented to Self, Oriented to Place, Oriented to  Time, Oriented to Situation Alcohol / Substance Use: Not Applicable Psych Involvement: No (comment)  Admission diagnosis:  Sepsis Rapid A Fib Patient Active Problem List   Diagnosis Date Noted  . Acute deep vein thrombosis (DVT) of both lower extremities ([name])   . Rapid atrial fibrillation ([place]) 12/02/2018  . Acute blood loss anemia   . Labile blood glucose   . AKI (acute kidney injury) ([place])   . Chronic right  hip pain   . Acute renal failure superimposed on stage 3a chronic kidney disease ([place])   . Cough with fever   . Headache due to intracranial disease 11/14/2018  . Trochanteric bursitis, right hip 11/14/2018  . Cerebral edema ([place]) 11/13/2018  . Hyperlipidemia LDL goal <70 11/13/2018  . Marijuana  user 11/13/2018  . Right middle cerebral artery stroke ([place]) 11/13/2018  . Acute CVA (cerebrovascular accident) ([place])   . Noncompliance   . Dysphagia, post-stroke   . Acute on chronic combined systolic and diastolic CHF (congestive heart failure) ([place])   . Wide-complex tachycardia ([place])   . DCM (dilated cardiomyopathy) ([place])   . Atrial fibrillation ([place]) 11/04/2018  . Acute on chronic systolic heart failure ([place])   . Prolonged Q-T interval on ECG   . Elevated troponin   . CHF exacerbation ([place]) 10/30/2018  . Acute respiratory failure with hypoxia ([place])   . Acute kidney injury ([place])   . Entrapment of right ulnar nerve 02/28/2018  . Carpal tunnel syndrome of right wrist 02/28/2018  . Impotence due to erectile dysfunction 09/30/2017  . Solitary pulmonary nodule 06/10/2017  . Neck pain 04/06/2017  . Paroxysmal atrial fibrillation ([name])   . Visit for monitoring Tikosyn therapy 03/25/2017  . ICD (implantable cardioverter-defibrillator) in place 09/13/2016  . Chest pain 09/13/2016  . Tobacco abuse 09/13/2016  . Hospital discharge follow-up 09/13/2016  . Upper back pain 09/13/2016  . Housing problems 09/13/2016  . Persistent atrial fibrillation ([place])   . Atrial fibrillation with RVR ([place])   . Ischemic cardiomyopathy   . Cerebral infarction ([place])   . Stroke (cerebrum) ([name]) Lg L MCA infarct w/ hemorrhagic conversion, embolic d/t AF 25/74/9355  . CHF (congestive heart failure) ([place]) 08/14/2016  . HTN (hypertension) 08/14/2016  . Chronic Hepatitis C  08/14/2016   PCP:  [name], MD Pharmacy:   [facility], [place] [address] [place] [address] Phone: [phone] Fax: [phone]  [facility] [phone] [place], [place] - [address] [place] [address] [place] [address] Phone: [phone] Fax: [phone]     Social Determinants of Health  (SDOH) Interventions    Readmission Risk Interventions No flowsheet data found.

## 2018-12-12 NOTE — Progress Notes (Signed)
Aware of patient.  Will get a HIDA scan to determine if patient has cholecystitis vs referred pain and findings from his RLL PNA noted on CT scan.  Given recent CVA and EF of 15-20% along with need for anticoagulation, etc, if his HIDA is positive would likely need perc chole drain by IR.  Will follow and await HIDA scan.  Henreitta Cea 8:19 AM 12/12/2018

## 2018-12-12 NOTE — Progress Notes (Signed)
[place] for Heparin Indication: atrial fibrillation, extensive DVT, high-probability PE  Allergies  Allergen Reactions  . Benadryl [Diphenhydramine] Palpitations    Patient Measurements: Height: 5\' 7"  (170.2 cm) Weight: 182 lb 8.7 oz (82.8 kg) IBW/kg (Calculated) : 66.1 Heparin Dosing Weight: 85kg  Vital Signs: Temp: 97.8 F (36.6 C) (10/23 0456) Temp Source: Oral (10/23 0456) BP: 134/69 (10/23 0456) Pulse Rate: 91 (10/23 0456)  Labs: Recent Labs    12/10/18 0739  12/11/18 0345 12/11/18 1512 12/11/18 1748 12/12/18 0348  HGB 10.7*   < > 9.0* 9.0*  --  8.9*  HCT 32.2*   < > 27.7* 27.9*  --  27.4*  PLT 327   < > 363 363  --  359  HEPARINUNFRC 0.42   < > <0.10*  --  0.58 0.22*  CREATININE 1.36*  --  1.12  --   --  1.15   < > = values in this interval not displayed.    Estimated Creatinine Clearance: 65.1 mL/min (by C-G formula based on SCr of 1.15 mg/dL).   Medical History: Past Medical History:  Diagnosis Date  . Atrial fibrillation ([place])   . CHF (congestive heart failure) ([place])   . Hepatitis C   . Hypertension   . Stroke ([place])   . Visit for monitoring Tikosyn therapy 03/26/2017     Assessment: 63 yoM with rapid AFib and DVT with hx recent hemorrhagic stroke. Pharmacy cleared to start IV heparin (low goal no bolus) by CCM and neurology with stable head CT on 10/19.  Heparin level remains therapeutic. CBC stable. No bleeding or issues with infusion reported.  10/23 AM update:  Heparin level low after rate decrease No issues with infusion per RN  Goal of Therapy:  Heparin level 0.3-0.5 units/ml Monitor platelets by anticoagulation protocol: Yes   Plan:  -Inc heparin to 2200 units/hr -Re-check heparin level at [place], PharmD, [place] Pharmacist Phone: [phone]

## 2018-12-12 NOTE — Progress Notes (Signed)
  Speech Language Pathology Treatment: Dysphagia  Patient Details Name: Glenn [name] MRN: [id number] DOB: [birth year] Today's Date: 12/12/2018 Time: [id number] SLP Time Calculation (min) (ACUTE ONLY): 13 min  Assessment / Plan / Recommendation Clinical Impression  Pt was encountered awake/alert sitting upright in bed and he was agreeable to ST tx session.  Pt was observed with trials of thin liquid via cup sip and puree via assisted self-feeding.  Pt exhibited eructation x2 and delayed throat clear following 2/10 thin liquid trials.  Pt benefited from verbal cues to limit bolus size.  Pt additionally exhibited prolonged AP transport and a delayed throat clear following 4/10 puree trials.  Vitals remained steady throughout this tx session.  Recommend continuation of Dysphagia 1 (puree) solids/thin liquids and a MBS to further evaluate swallow function.  Pt will additionally benefit from full supervision during meals to assist with self-feeding and to cue for compensatory strategies.     HPI HPI: 68 year old male with past medical history significant for hypertension, hyperlipidemia, A. Fib previously on eliquis, HFrEF with EF 15-20% s/p ICD and cardioversion 10/29/2018, Hep C, CKD stage III, who admitted on 9/21 with left hemiparessis found to have large right MCA subacute hemorrhage. Additionally found to have acute nonischemic cardiomyopathy with EF 15-20% s/p ICD placement and cardioversion 9/9 for Afib. Pt with CIR admission from 9/25 to 10/13. Discharge back to acute with fever, A. fib with RVR, abdominal pain, respiratory distress with diagnosis of DVT. Pt awaiting IVC. Chest x-ray (12/04/18) concerning for increased opacity at the RIGHT lung base consistent with pleural effusion and atelectasis or infiltrate. Pt currently NPO d/t concerns of aspiration in setting of continued cognitive deficits and respiratory distress.       SLP Plan  MBS;Continue with current plan of care        Recommendations  Diet recommendations: Dysphagia 1 (puree);Thin liquid Liquids provided via: Cup;Straw Medication Administration: Crushed with puree Supervision: Staff to assist with self feeding;Full supervision/cueing for compensatory strategies Compensations: Minimize environmental distractions;Slow rate Postural Changes and/or Swallow Maneuvers: Seated upright 90 degrees                Oral Care Recommendations: Oral care BID;Staff/trained caregiver to provide oral care Follow up Recommendations: Skilled Nursing facility SLP Visit Diagnosis: Dysphagia, oropharyngeal phase (R13.12) Plan: MBS;Continue with current plan of care       Bretta Bang, M.S., [place] Office: [phone]               [place] 12/12/2018, 4:19 PM

## 2018-12-12 NOTE — Consult Note (Signed)
   [facility] CM Inpatient Consult   12/12/2018  Glenn [name] 08/25/1950 [id number]    Patient reviewed for LLOS (long length of stay) and forpotential need of [facility] care management services with a 37% extremehigh risk scorefor unplanned readmission, has 4 hospitalizations in the past 6 months and a 30 day readmission, under his [facility] plan.  Patient was recently outreached by [facility] RN CM for care coordination but was currently in Inpatient Rehab (CIR) at that time.  Review of medical record and MD brief narrative note states as: 68 year old male with history of HTN, HLD, A. fib previously on Eliquis, EF 15-20% s/p ICD and cardioversion 10/29/2018, Hep C, CKD-III, recently hospitalized 9/21-10/13 for right MCA subacute hemorrhagic CVA and discharged to CIR.     He returns the same date with A-fib with RVR and abdominal pain. Admitted to ICU for A. fib with RVR, sepsis likely due to aspiration pneumonia, AoC CHF, extensive DVT with IVC placement. He is nowon room air. Still full code.  Cardiology following. He lives with friends before hospitalization.  He is currently recommended for SNF (skilled nursing facility) per PT and Inpatient rehab coordinator, (has L sided weakness from previous CVA) requiring increased assist at discharge given current mobility deficits.  Will follow progress and disposition.  If disposition needs change and there are community follow-up neededfrom [facility] Care Management, please refer.  Of note, [facility] Care Management services does not replace or interfere with any services that are arranged bytransition of carecase management or social work.    For questionsand referral,please contact:   Edwena Felty A. Aileana Hodder, BSN, RN-BC [facility] Liaison  Cell: [phone]

## 2018-12-13 ENCOUNTER — Encounter (HOSPITAL_COMMUNITY): Payer: Self-pay | Admitting: Interventional Radiology

## 2018-12-13 ENCOUNTER — Inpatient Hospital Stay (HOSPITAL_COMMUNITY): Payer: Medicare HMO

## 2018-12-13 DIAGNOSIS — I639 Cerebral infarction, unspecified: Secondary | ICD-10-CM

## 2018-12-13 DIAGNOSIS — Z95810 Presence of automatic (implantable) cardiac defibrillator: Secondary | ICD-10-CM

## 2018-12-13 HISTORY — PX: IR PERC CHOLECYSTOSTOMY: IMG2326

## 2018-12-13 LAB — CBC
HCT: 29.9 % — ABNORMAL LOW (ref 39.0–52.0)
Hemoglobin: 9.7 g/dL — ABNORMAL LOW (ref 13.0–17.0)
MCH: 30.5 pg (ref 26.0–34.0)
MCHC: 32.4 g/dL (ref 30.0–36.0)
MCV: 94 fL (ref 80.0–100.0)
Platelets: 371 10*3/uL (ref 150–400)
RBC: 3.18 MIL/uL — ABNORMAL LOW (ref 4.22–5.81)
RDW: 13.1 % (ref 11.5–15.5)
WBC: 8.1 10*3/uL (ref 4.0–10.5)
nRBC: 0 % (ref 0.0–0.2)

## 2018-12-13 LAB — BASIC METABOLIC PANEL
Anion gap: 10 (ref 5–15)
BUN: 16 mg/dL (ref 8–23)
CO2: 23 mmol/L (ref 22–32)
Calcium: 8.5 mg/dL — ABNORMAL LOW (ref 8.9–10.3)
Chloride: 100 mmol/L (ref 98–111)
Creatinine, Ser: 1.02 mg/dL (ref 0.61–1.24)
GFR calc Af Amer: >60 mL/min (ref >60)
GFR calc non Af Amer: >60 mL/min (ref >60)
Glucose, Bld: 95 mg/dL (ref 70–99)
Potassium: 4.9 mmol/L (ref 3.5–5.1)
Sodium: 133 mmol/L — ABNORMAL LOW (ref 135–145)

## 2018-12-13 LAB — HEPARIN LEVEL (UNFRACTIONATED)
Heparin Unfractionated: 0.2 IU/mL — ABNORMAL LOW (ref 0.30–0.70)
Heparin Unfractionated: <0.1 IU/mL — ABNORMAL LOW (ref 0.30–0.70)
Heparin Unfractionated: 0.29 IU/mL — ABNORMAL LOW (ref 0.30–0.70)

## 2018-12-13 LAB — MAGNESIUM: Magnesium: 2.3 mg/dL (ref 1.7–2.4)

## 2018-12-13 MED ORDER — SODIUM CHLORIDE 0.9% FLUSH
5.0000 mL | Freq: Three times a day (TID) | INTRAVENOUS | Status: DC
  Administered 2018-12-13 – 2018-12-23 (×18): 5 mL

## 2018-12-13 MED ORDER — MIDAZOLAM HCL 2 MG/2ML IJ SOLN
INTRAMUSCULAR | Status: AC
  Filled 2018-12-13: qty 2

## 2018-12-13 MED ORDER — SACCHAROMYCES BOULARDII 250 MG PO CAPS
250.0000 mg | ORAL_CAPSULE | Freq: Two times a day (BID) | ORAL | Status: DC
  Administered 2018-12-13 – 2018-12-24 (×22): 250 mg via ORAL
  Filled 2018-12-13 (×22): qty 1

## 2018-12-13 MED ORDER — LIDOCAINE HCL 1 % IJ SOLN
INTRAMUSCULAR | Status: AC
  Filled 2018-12-13: qty 20

## 2018-12-13 MED ORDER — PANTOPRAZOLE SODIUM 40 MG PO TBEC
40.0000 mg | DELAYED_RELEASE_TABLET | Freq: Every day | ORAL | Status: DC
  Administered 2018-12-13 – 2018-12-24 (×12): 40 mg via ORAL
  Filled 2018-12-13 (×12): qty 1

## 2018-12-13 MED ORDER — IOHEXOL 300 MG/ML  SOLN
50.0000 mL | Freq: Once | INTRAMUSCULAR | Status: AC | PRN
  Administered 2018-12-13: 13:00:00 10 mL

## 2018-12-13 MED ORDER — METOPROLOL TARTRATE 25 MG PO TABS
25.0000 mg | ORAL_TABLET | Freq: Four times a day (QID) | ORAL | Status: DC
  Administered 2018-12-13 – 2018-12-16 (×11): 25 mg via ORAL
  Filled 2018-12-13 (×11): qty 1

## 2018-12-13 MED ORDER — DICLOFENAC SODIUM 1 % TD GEL
2.0000 g | Freq: Four times a day (QID) | TRANSDERMAL | Status: DC
  Administered 2018-12-13 – 2018-12-23 (×42): 2 g via TOPICAL
  Filled 2018-12-13: qty 100

## 2018-12-13 MED ORDER — MIDAZOLAM HCL 2 MG/2ML IJ SOLN
INTRAMUSCULAR | Status: AC | PRN
  Administered 2018-12-13: 0.5 mg via INTRAVENOUS
  Administered 2018-12-13: 1 mg via INTRAVENOUS

## 2018-12-13 MED ORDER — FENTANYL CITRATE (PF) 100 MCG/2ML IJ SOLN
INTRAMUSCULAR | Status: AC | PRN
  Administered 2018-12-13: 50 ug via INTRAVENOUS
  Administered 2018-12-13: 25 ug via INTRAVENOUS

## 2018-12-13 MED ORDER — FENTANYL CITRATE (PF) 100 MCG/2ML IJ SOLN
INTRAMUSCULAR | Status: AC
  Filled 2018-12-13: qty 2

## 2018-12-13 NOTE — Progress Notes (Signed)
  Speech Language Pathology Treatment: Dysphagia  Patient Details Name: Glenn [name] MRN: [id number] DOB: 05/19/1950 Today's Date: 12/13/2018 Time: [id number] SLP Time Calculation (min) (ACUTE ONLY): 15 min  Assessment / Plan / Recommendation Clinical Impression  Pt was encountered awake/alert sitting upright in bed with RN and NT in room.  Pt was observed with trials of thin liquid via tsp/cup, puree, and medications crushed in puree administered by RN.  Pt exhibited improved bolus acceptance, bolus formation, and AP transport in comparison to yesterday's tx session.  Pt exhibited eructation x1 followed by a delayed throat clear following 1/10 thin liquid trials.  Pt then reported a "burning sensation" in his chest and throat.  He reported hx of reflux; however, RN stated that pt was not currently on a PPI.  No overt s/sx of aspiration were observed with any trials.  Recommend Dysphagia 1 (puree) solids and thin liquid with full supervision to cue for use of compensatory strategies.     HPI HPI: 68 year old male with past medical history significant for hypertension, hyperlipidemia, A. Fib previously on eliquis, HFrEF with EF 15-20% s/p ICD and cardioversion 10/29/2018, Hep C, CKD stage III, who admitted on 9/21 with left hemiparessis found to have large right MCA subacute hemorrhage. Additionally found to have acute nonischemic cardiomyopathy with EF 15-20% s/p ICD placement and cardioversion 9/9 for Afib. Pt with CIR admission from 9/25 to 10/13. Discharge back to acute with fever, A. fib with RVR, abdominal pain, respiratory distress with diagnosis of DVT. Pt awaiting IVC. Chest x-ray (12/04/18) concerning for increased opacity at the RIGHT lung base consistent with pleural effusion and atelectasis or infiltrate. Pt currently NPO d/t concerns of aspiration in setting of continued cognitive deficits and respiratory distress.       SLP Plan  Continue with current plan of care        Recommendations  Diet recommendations: Dysphagia 1 (puree);Thin liquid Liquids provided via: Cup;Straw Medication Administration: Crushed with puree Supervision: Staff to assist with self feeding;Full supervision/cueing for compensatory strategies Compensations: Minimize environmental distractions;Slow rate Postural Changes and/or Swallow Maneuvers: Seated upright 90 degrees                Oral Care Recommendations: Oral care BID;Staff/trained caregiver to provide oral care Follow up Recommendations: Skilled Nursing facility SLP Visit Diagnosis: Dysphagia, oropharyngeal phase (R13.12) Plan: Continue with current plan of care       Bretta Bang, M.S., [place] Office: [phone]           [place] 12/13/2018, 4:05 PM

## 2018-12-13 NOTE — Sedation Documentation (Signed)
Vital signs stable. 

## 2018-12-13 NOTE — Progress Notes (Signed)
PROGRESS NOTE  Glenn [name] [id number] DOB: [birth year]   PCP: [name], MD  Patient is from: [facility]. lives with friends before hospitalization.  DOA: 12/02/2018 LOS: 11  Brief Narrative / Interim history: 68 year old male with history of HTN, HLD, A. fib previously on Eliquis, EF 15-20% s/p ICD and cardioversion 10/29/2018, Hep C, CKD-III, recently hospitalized 9/21-10/13 for right MCA subacute hemorrhagic CVA and discharged to CIR. He returns the same date with A. fib with RVR and abdominal pain. Admitted to ICU for A. fib with RVR, sepsis likely due to aspiration pneumonia, AoC CHF, extensive DVT with IVC placement.  He is nowon room air. Still full code.  Cardiology following.  TRH reassumed care on 12/10/2018.  Repeat CT chest without contrast on 10/21 with progressive RLL pneumonia and possible cholecystitis.  RUQ [place] and HIDA positive for acute cholecystitis.  General surgery and IR following.  Plan for percutaneous biliary drainage and fluid culture  Subjective: No major events overnight of this morning.  Complains about right knee pain.  Denies chest pain, dyspnea, nausea, vomiting or abdominal pain.  Oriented x4 except date of months.  Objective: Vitals:   12/13/18 1253 12/13/18 1257 12/13/18 1304 12/13/18 1331  BP: (!) 145/65 (!) 148/63 (!) 159/83 (!) 153/73  Pulse: 76 75 90 63  Resp: 20 (!) 22 (!) 28   Temp:    98.7 F (37.1 C)  TempSrc:    Oral  SpO2: 98% 100% 96% 97%  Weight:      Height:        Intake/Output Summary (Last 24 hours) at 12/13/2018 1352 Last data filed at 12/13/2018 1332 Gross per 24 hour  Intake 840 ml  Output 1650 ml  Net -810 ml   Filed Weights   12/10/18 0500 12/12/18 0500 12/13/18 0345  Weight: 84.3 kg 82.8 kg 81.4 kg    Examination:  GENERAL: No acute distress.  Appears well.  HEENT: MMM.  Vision and hearing grossly intact.  PERRL.  Mild left facial droop. NECK: Supple.  No apparent JVD.  RESP:  No IWOB. Good air  movement bilaterally. CVS: Irregular.  Normal rate. Heart sounds normal.  ABD/GI/GU: Bowel sounds present. Soft.  RUQ tenderness. MSK/EXT: Left hemiparesis.  No apparent swelling, erythema or deformity over right knee.  No tenderness to palpation.  Fair range of motion. SKIN: no apparent skin lesion or wound NEURO: Awake, alert and oriented x4 except the date of months.  Left hemiparesis and left facial droop. PSYCH: Calm. Normal affect.   Assessment & Plan: Sepsis due to RLL pneumonia/aspiration pneumonia and acute cholecystitis: last fever 101.6 on 10/19.  Leukocytosis resolved. -Completed ceftriaxone course on 10/8 previous admission -Vancomycin 10/13-10/18 -Started on Zosyn 10/13 for pneumonia, now with cholecystitis. -Discussed with ID, Dr. Graylon Good who recommended continuing Zosyn pending biliary culture.  Acute cholecystitis: RUQ tenderness on exam.  CT chest, RUQ [place] and HIDA positive for acute cholecystitis. -IV antibiotics as above -GS and IR following.  A. fib with RVR: Heart rate in 90s.  Had 25 beats of V. tach yesterday evening but not symptomatic. -Cardiology managing.  On Cardizem drip -Resume digoxin and metoprolol after percutaneous cholecystostomy -On heparin for anticoagulation-eventually will be back on Eliquis but need to clarify with NS first.   Acute on chronic systolic CHF: Echo [id number] with LVEF 15-20%.  ICD status. -On p.o. Lasix per Cardiology -Delene Loll on hold for procedure and n.p.o. status -Monitor fluid status, renal function and electrolytes and renal  Extensive DVT s/p IVC  filter -Continue heparin -Eventually transition to Eliquis. -IR to reassess for filter retrieval in about 8 to 12 weeks.  Acute respiratory failure with hypoxia: Multifactorial-pneumonia, A. fib with RVR and CHF. On room air now. -Continue bronchodilators-Xopenex and Atrovent -Antibiotics as above -BiPAP at night  Right pleural effusion: Noted on CXR and CT abdomen and pelvis.   Reported as small on repeat CT 10/21.  -Treat underlying pneumonia  History of MCA hemorrhagic stroke with residual left hemiparesis/dysphagia: Hospitalized for this 9/21-10/13 and discharged to CIR. Returns with A. fib with RVR and sepsis as above -CT head 10/18-expected evolution of BG hemorrhage and prominent anterior right MCA infarct.  Resolved lesion of IVH -Closely monitor while on heparin -PT/OT.  Normocytic anemia: Likely due to acute on chronic illness and blood loss.  No obvious bleeding now.  No hematochezia or melena per RN.  H&H stable. -Continue monitoring  History of hepatitis C -Outpatient follow-up  AKI: previously thought to have CKD-3.  Now creatinine and GFR within normal. -Continue monitoring   Dysphagia -Dysphagia 1 diet and MBS per SLP recommendation.  Malnutrition Type/Characteristics and interventions: Nutrition Problem: Inadequate oral intake Etiology: inability to eat  Signs/Symptoms: NPO status  Interventions: Ensure Enlive (each supplement provides 350kcal and 20 grams of protein), MVI, Magic cup, Hormel Shake   Goal of care discussion:  10/22-discussed about the CODE STATUS.  Patient defers decision to his son, [name].  Reached out to [name] over the phone and discussed the risk and benefits of CPR/intubation. Glenn [name] says, "do what it takes to keep my father alive".   DVT prophylaxis: On heparin drip Code Status: Full code Family Communication: Updated patient's son over the phone 10/22. Disposition Plan: Remains inpatient. Consultants: PCCM, cardiology, ID (over the phone), GS and IR  Procedures:  10/24-percutaneous cholecystostomy  Microbiology summarized: MRSA PCR negative Exploited sputum not acceptable for culture Blood culture with coag negative staph in 1 out of 2 bottles Previous blood cultures negative. Biliary culture pending.  Sch Meds:  Scheduled Meds:  chlorhexidine  15 mL Mouth Rinse BID   Chlorhexidine  Gluconate Cloth  6 each Topical Daily   diclofenac sodium  2 g Topical QID   digoxin  0.3125 mg Oral Daily   feeding supplement (ENSURE ENLIVE)  237 mL Oral TID BM   fentaNYL       furosemide  40 mg Oral Daily   gabapentin  300 mg Oral Q8H   ipratropium  0.5 mg Nebulization BID   levalbuterol  0.63 mg Nebulization BID   lidocaine       mouth rinse  15 mL Mouth Rinse q12n4p   metoprolol succinate  200 mg Oral Daily   midazolam       multivitamin with minerals  1 tablet Oral Daily   sodium chloride flush  5 mL Intracatheter Q8H   Continuous Infusions:  sodium chloride Stopped (12/10/18 1036)   diltiazem (CARDIZEM) infusion 5 mg/hr (12/12/18 1633)   heparin Stopped (12/13/18 1008)   piperacillin-tazobactam (ZOSYN)  IV 3.375 g (12/13/18 0525)   PRN Meds:.sodium chloride, acetaminophen, bisacodyl, calcium carbonate, hydrALAZINE, metoprolol tartrate  Antimicrobials: Anti-infectives (From admission, onward)   Start     Dose/Rate Route Frequency Ordered Stop   12/07/18 0800  vancomycin (VANCOCIN) 1,500 mg in sodium chloride 0.9 % 500 mL IVPB  Status:  Discontinued     1,500 mg 250 mL/hr over 120 Minutes Intravenous Every 24 hours 12/06/18 1206 12/07/18 0931   12/06/18 0330  vancomycin (VANCOCIN)  1,500 mg in sodium chloride 0.9 % 500 mL IVPB     1,500 mg 250 mL/hr over 120 Minutes Intravenous  Once 12/06/18 0323 12/06/18 0557   12/03/18 2200  vancomycin (VANCOCIN) 1,500 mg in sodium chloride 0.9 % 500 mL IVPB  Status:  Discontinued     1,500 mg 250 mL/hr over 120 Minutes Intravenous Every 24 hours 12/02/18 2115 12/03/18 0850   12/03/18 2100  vancomycin (VANCOCIN) 1,250 mg in sodium chloride 0.9 % 250 mL IVPB  Status:  Discontinued     1,250 mg 166.7 mL/hr over 90 Minutes Intravenous Every 24 hours 12/03/18 0850 12/03/18 1009   12/02/18 2100  piperacillin-tazobactam (ZOSYN) IVPB 3.375 g     3.375 g 12.5 mL/hr over 240 Minutes Intravenous Every 8 hours 12/02/18 2040       12/02/18 2045  vancomycin (VANCOCIN) 1,750 mg in sodium chloride 0.9 % 500 mL IVPB     1,750 mg 250 mL/hr over 120 Minutes Intravenous  Once 12/02/18 2040 12/03/18 0100       I have personally reviewed the following labs and images: CBC: Recent Labs  Lab 12/10/18 1731 12/11/18 0345 12/11/18 1512 12/12/18 0348 12/13/18 0312  WBC 14.7* 10.1 10.0 8.7 8.1  NEUTROABS 10.3*  --   --   --   --   HGB 10.8* 9.0* 9.0* 8.9* 9.7*  HCT 32.3* 27.7* 27.9* 27.4* 29.9*  MCV 94.7 94.9 95.9 95.1 94.0  PLT 337 363 363 359 371   BMP &GFR Recent Labs  Lab 12/09/18 0324 12/10/18 0739 12/11/18 0345 12/12/18 0348 12/12/18 1918 12/13/18 0312  NA 135 136 135 135  --  133*  K 4.3 3.7 3.9 4.0 4.3 4.9  CL 95* 95* 99 99  --  100  CO2 24 27 26 25   --  23  GLUCOSE 114* 115* 108* 95  --  95  BUN 35* 39* 30* 24*  --  16  CREATININE 1.45* 1.36* 1.12 1.15  --  1.02  CALCIUM 8.5* 8.7* 8.3* 8.6*  --  8.5*  MG 2.2 2.6* 2.3 2.4 2.3 2.3  PHOS 3.9 3.7  --   --   --   --    Estimated Creatinine Clearance: 72.8 mL/min (by C-G formula based on SCr of 1.02 mg/dL). Liver & Pancreas: Recent Labs  Lab 12/11/18 0345 12/12/18 0348  AST 32 24  ALT 29 24  ALKPHOS 102 105  BILITOT 0.9 0.7  PROT 6.7 7.3  ALBUMIN 2.0* 2.2*   No results for input(s): LIPASE, AMYLASE in the last 168 hours. No results for input(s): AMMONIA in the last 168 hours. Diabetic: No results for input(s): HGBA1C in the last 72 hours. Recent Labs  Lab 12/09/18 2355 12/10/18 0347 12/10/18 0732 12/10/18 1119 12/10/18 1514  GLUCAP 128* 107* 109* 118* 108*   Cardiac Enzymes: No results for input(s): CKTOTAL, CKMB, CKMBINDEX, TROPONINI in the last 168 hours. No results for input(s): PROBNP in the last 8760 hours. Coagulation Profile: No results for input(s): INR, PROTIME in the last 168 hours. Thyroid Function Tests: No results for input(s): TSH, T4TOTAL, FREET4, T3FREE, THYROIDAB in the last 72 hours. Lipid Profile: No results  for input(s): CHOL, HDL, LDLCALC, TRIG, CHOLHDL, LDLDIRECT in the last 72 hours. Anemia Panel: No results for input(s): VITAMINB12, FOLATE, FERRITIN, TIBC, IRON, RETICCTPCT in the last 72 hours. Urine analysis:    Component Value Date/Time   COLORURINE AMBER (A) 12/05/2018 0227   APPEARANCEUR HAZY (A) 12/05/2018 0227   LABSPEC 1.040 (  H) 12/05/2018 0227   PHURINE 5.0 12/05/2018 0227   GLUCOSEU NEGATIVE 12/05/2018 0227   HGBUR SMALL (A) 12/05/2018 0227   BILIRUBINUR NEGATIVE 12/05/2018 [place] 12/05/2018 0227   PROTEINUR 100 (A) 12/05/2018 0227   NITRITE NEGATIVE 12/05/2018 0227   LEUKOCYTESUR NEGATIVE 12/05/2018 0227   Sepsis Labs: Invalid input(s): PROCALCITONIN, [place]  Microbiology: Recent Results (from the past 240 hour(s))  Culture, blood (routine x 2)     Status: None   Collection Time: 12/05/18  4:06 AM   Specimen: BLOOD LEFT HAND  Result Value Ref Range Status   Specimen Description BLOOD LEFT HAND  Final   Special Requests   Final    BOTTLES DRAWN AEROBIC ONLY Blood Culture adequate volume   Culture   Final    NO GROWTH 5 DAYS Performed at [facility], [address] [place], [place] [address]    Report Status 12/10/2018 FINAL  Final  Culture, blood (routine x 2)     Status: Abnormal   Collection Time: 12/05/18  4:06 AM   Specimen: BLOOD RIGHT HAND  Result Value Ref Range Status   Specimen Description BLOOD RIGHT HAND  Final   Special Requests   Final    BOTTLES DRAWN AEROBIC ONLY Blood Culture results may not be optimal due to an inadequate volume of blood received in culture bottles   Culture  Setup Time   Final    AEROBIC BOTTLE ONLY GRAM POSITIVE COCCI CRITICAL RESULT CALLED TO, READ BACK BY AND VERIFIED WITH: V BRYK PHARMD 12/06/18 0315 JDW    Culture (A)  Final    STAPHYLOCOCCUS SPECIES (COAGULASE NEGATIVE) THE SIGNIFICANCE OF ISOLATING THIS ORGANISM FROM A SINGLE SET OF BLOOD CULTURES WHEN MULTIPLE SETS ARE DRAWN IS  UNCERTAIN. PLEASE NOTIFY THE MICROBIOLOGY DEPARTMENT WITHIN ONE WEEK IF SPECIATION AND SENSITIVITIES ARE REQUIRED. Performed at [facility], [place] [address], [place], [name] [address]    Report Status 12/07/2018 FINAL  Final  Expectorated sputum assessment w rflx to resp cult     Status: None   Collection Time: 12/06/18  9:06 PM   Specimen: SPU  Result Value Ref Range Status   Specimen Description SPUTUM  Final   Special Requests NONE  Final   Sputum evaluation   Final    Sputum specimen not acceptable for testing.  Please recollect.   RESULT CALLED TO, READ BACK BY AND VERIFIED WITH: RN York Cerise CRUZ [id number] FCP Performed at [facility] [address] [place], [place] [address]    Report Status 12/07/2018 FINAL  Final    Radiology Studies: Nm Hepatobiliary Liver Func  Result Date: 12/12/2018 CLINICAL DATA:  Right upper quadrant pain.  Cholecystitis suspected. EXAM: NUCLEAR MEDICINE HEPATOBILIARY IMAGING TECHNIQUE: Sequential images of the abdomen were obtained out to 60 minutes following intravenous administration of radiopharmaceutical. RADIOPHARMACEUTICALS:  5.47 mCi Tc-15m  Choletec IV COMPARISON:  Ultrasound 12/11/2018 FINDINGS: Prompt uptake and biliary excretion of activity by the liver is seen. Biliary activity passes into small bowel, consistent with patent common bile duct. No gallbladder activity visualized within the first hour. The patient then received 3 mg of morphine the intravenous infusion and subsequent imaging was carried out for an additional 30 minutes. No gallbladder activity noted post morphine. IMPRESSION: 1. Nonvisualization of the gallbladder which may reflect cystic duct obstruction and acute cholecystitis. Electronically Signed   By: Kerby Moors M.D.   On: 12/12/2018 16:39   Adewale Pucillo T. Yosef Krogh [facility]ist  If 7PM-7AM, please contact night-coverage [email or web address] Password [email or web address] 12/13/2018, 1:52 PM

## 2018-12-13 NOTE — Progress Notes (Signed)
Progress Note  Patient Name: Glenn [name] Date of Encounter: 12/13/2018  Primary Cardiologist: Virl Axe, MD   Subjective   Awaiting percutaneous cholecystostomy. Denies shortness of breath and palpitations.  Inpatient Medications    Scheduled Meds: . chlorhexidine  15 mL Mouth Rinse BID  . Chlorhexidine Gluconate Cloth  6 each Topical Daily  . diclofenac sodium  2 g Topical QID  . digoxin  0.3125 mg Oral Daily  . feeding supplement (ENSURE ENLIVE)  237 mL Oral TID BM  . furosemide  40 mg Oral Daily  . gabapentin  300 mg Oral Q8H  . ipratropium  0.5 mg Nebulization BID  . levalbuterol  0.63 mg Nebulization BID  . mouth rinse  15 mL Mouth Rinse q12n4p  . metoprolol succinate  200 mg Oral Daily  . multivitamin with minerals  1 tablet Oral Daily   Continuous Infusions: . sodium chloride Stopped (12/10/18 1036)  . diltiazem (CARDIZEM) infusion 5 mg/hr (12/12/18 1633)  . heparin Stopped (12/13/18 1008)  . piperacillin-tazobactam (ZOSYN)  IV 3.375 g (12/13/18 0525)   PRN Meds: sodium chloride, acetaminophen, bisacodyl, calcium carbonate, hydrALAZINE, metoprolol tartrate   Vital Signs    Vitals:   12/12/18 2029 12/13/18 0345 12/13/18 0454 12/13/18 0806  BP: 111/62  (!) 136/50   Pulse: 65  81   Resp: 20  17   Temp: 99.8 F (37.7 C)  98.4 F (36.9 C)   TempSrc: Oral  Oral   SpO2: 99%  98% 96%  Weight:  81.4 kg    Height:        Intake/Output Summary (Last 24 hours) at 12/13/2018 1022 Last data filed at 12/13/2018 0647 Gross per 24 hour  Intake 840 ml  Output 1025 ml  Net -185 ml   Filed Weights   12/10/18 0500 12/12/18 0500 12/13/18 0345  Weight: 84.3 kg 82.8 kg 81.4 kg    Telemetry    Rate controlled atrial fibrillation, run of VT seen yesterday evening- Personally Reviewed  Physical Exam   GEN: No acute distress.   Neck: No JVD Cardiac: Irregular rhythm, no murmurs, rubs, or gallops.  Respiratory: Clear to auscultation bilaterally. GI:  Soft, non-distended  MS: No edema; No deformity. Neuro:  Nonfocal  Psych: Normal affect   Labs    Chemistry Recent Labs  Lab 12/11/18 0345 12/12/18 0348 12/12/18 1918 12/13/18 0312  NA 135 135  --  133*  K 3.9 4.0 4.3 4.9  CL 99 99  --  100  CO2 26 25  --  23  GLUCOSE 108* 95  --  95  BUN 30* 24*  --  16  CREATININE 1.12 1.15  --  1.02  CALCIUM 8.3* 8.6*  --  8.5*  PROT 6.7 7.3  --   --   ALBUMIN 2.0* 2.2*  --   --   AST 32 24  --   --   ALT 29 24  --   --   ALKPHOS 102 105  --   --   BILITOT 0.9 0.7  --   --   GFRNONAA >60 >60  --  >60  GFRAA >60 >60  --  >60  ANIONGAP 10 11  --  10     Hematology Recent Labs  Lab 12/11/18 1512 12/12/18 0348 12/13/18 0312  WBC 10.0 8.7 8.1  RBC 2.91* 2.88* 3.18*  HGB 9.0* 8.9* 9.7*  HCT 27.9* 27.4* 29.9*  MCV 95.9 95.1 94.0  MCH 30.9 30.9 30.5  MCHC  32.3 32.5 32.4  RDW 13.2 13.2 13.1  PLT 363 359 371    Cardiac EnzymesNo results for input(s): TROPONINI in the last 168 hours. No results for input(s): TROPIPOC in the last 168 hours.   BNPNo results for input(s): BNP, PROBNP in the last 168 hours.   DDimer No results for input(s): DDIMER in the last 168 hours.   Radiology    Nm Hepatobiliary Liver Func  Result Date: 12/12/2018 CLINICAL DATA:  Right upper quadrant pain.  Cholecystitis suspected. EXAM: NUCLEAR MEDICINE HEPATOBILIARY IMAGING TECHNIQUE: Sequential images of the abdomen were obtained out to 60 minutes following intravenous administration of radiopharmaceutical. RADIOPHARMACEUTICALS:  5.47 mCi Tc-78m  Choletec IV COMPARISON:  Ultrasound 12/11/2018 FINDINGS: Prompt uptake and biliary excretion of activity by the liver is seen. Biliary activity passes into small bowel, consistent with patent common bile duct. No gallbladder activity visualized within the first hour. The patient then received 3 mg of morphine the intravenous infusion and subsequent imaging was carried out for an additional 30 minutes. No gallbladder  activity noted post morphine. IMPRESSION: 1. Nonvisualization of the gallbladder which may reflect cystic duct obstruction and acute cholecystitis. Electronically Signed   By: Kerby Moors M.D.   On: 12/12/2018 16:39   US Abdomen Limited Ruq  Result Date: 12/11/2018 CLINICAL DATA:  Right upper quadrant pain for 8-10 months EXAM: ULTRASOUND ABDOMEN LIMITED RIGHT UPPER QUADRANT COMPARISON:  CT chest 12/11/2018 FINDINGS: Gallbladder: The gallbladder is distended. The wall of the gallbladder is thickened measuring 3.9 mm. There is pericholecystic fluid. Sludge and stones identified within the gallbladder. The largest gallstone measures 2 cm. Negative sonographic Murphy's sign. Common bile duct: Diameter: 5.1 mm Liver: Increased parenchymal echogenicity. No focal liver abnormality. Portal vein is patent on color Doppler imaging with normal direction of blood flow towards the liver. Other: Perihepatic ascites IMPRESSION: 1. Gallstones, gallbladder sludge and wall thickening concerning for acute cholecystitis. 2. Echogenic liver compatible with hepatic steatosis. Electronically Signed   By: Kerby Moors M.D.   On: 12/11/2018 16:44    Cardiac Studies   Echo 12/07/18: IMPRESSIONS  1. Left ventricular ejection fraction, by visual estimation, is 20%. The left ventricle has severely decreased function. Mildly increased left ventricular size. There is mildly increased left ventricular hypertrophy. 2. Left ventricular diastolic Doppler parameters are indeterminate in the setting of rapid atrial fibrillation. 3. Global right ventricle has mildly reduced systolic function.The right ventricular size is normal. No increase in right ventricular wall thickness. 4. Left atrial size was mild-moderately dilated. 5. Right atrial size was normal. 6. Trivial pericardial effusion is present. 7. The pericardial effusion is posterior to the left ventricle. 8. Mild aortic valve annular calcification. 9. The  mitral valve is grossly normal. Mild mitral valve regurgitation. 10. The tricuspid valve is grossly normal. Tricuspid valve regurgitation mild-moderate. 11. The aortic valve is tricuspid Aortic valve regurgitation is mild to moderate by color flow Doppler. 12. The pulmonic valve was grossly normal. Pulmonic valve regurgitation is mild by color flow Doppler. 13. Moderately elevated pulmonary artery systolic pressure. 14. The inferior vena cava is normal in size with greater than 50% respiratory variability, suggesting right atrial pressure of 3 mmHg. 15. A device wire is visualized. 16. The tricuspid regurgitant velocity is 3.68 m/s, and with an assumed right atrial pressure of 3 mmHg, the estimated right ventricular systolic pressure is moderately elevated at 57.2 mmHg. 17. No obvious vegetations involving valves or device wire.  Patient Profile     68 y.o. male with  nonischemic cardiomyopathy status post ICD, persistent atrial fibrillation, left lower extremity DVT status post IVC filter, recent hemorrhagic stroke, CKD III, admitted with persistent fever and respiratory failure thought to be due to pneumonia.  Cardiology has been assisting with atrial fibrillation with rapid ventricular response.  Assessment & Plan    1. Rapid atrial fibrillation: Attempting to wean diltiazem drip, currently on 5 mg/hr. Has not received digoxin or Toprol-XL today as he is NPO for procedure today. IV heparin on hold for procedure. Asymptomatic. HR in 70 bpm range currently. He had a recent hemorrhagic stroke.  Plan to initiate Eliquis once cleared by neurosurgery and pending the need for additional procedures.  Consider TEE/DCCV once acute illness is resolved if rates remain difficult to control.   2. Chronic combined CHF: Currently on Lasix 40 mg daily, digoxin and Toprol-XL. LVEF 15-20%. S/p ICD. Creatinine normal today. His home Delene Loll has been held in the setting of renal dysfunction. Entresto on hold for  procedure and npo status.  3. Sepsis: Percutaneous cholecystostomy with IR planned today.  4. DVT: S/p IVC filter. Currently on heparin and will switch to Eliquis when able.     For questions or updates, please contact [place] Please consult [email or web address] for contact info under Cardiology/STEMI.      Signed, Kate Sable, MD  12/13/2018, 10:22 AM

## 2018-12-13 NOTE — Progress Notes (Signed)
[facility] Progress Note     Subjective: CC: abdominal pain Patient denies pain currently but reports he was having some pain last night, especially with eating/drinking. Denies nausea. +bowel function. Understands that he is not a surgical candidate and plan for drain today if able.   Objective: Vital signs in last 24 hours: Temp:  [98.4 F (36.9 C)-99.8 F (37.7 C)] 98.4 F (36.9 C) (10/24 0454) Pulse Rate:  [65-91] 81 (10/24 0454) Resp:  [17-20] 17 (10/24 0454) BP: (111-139)/(50-62) 136/50 (10/24 0454) SpO2:  [96 %-99 %] 96 % (10/24 0806) Weight:  [81.4 kg] 81.4 kg (10/24 0345) Last BM Date: 12/10/18  Intake/Output from previous day: 10/23 0701 - 10/24 0700 In: 840 [P.O.:840] Out: 1400 [Urine:1400] Intake/Output this shift: No intake/output data recorded.  PE: Gen:  Alert, NAD, pleasant Card:  Irregularly irregular, pedal pulses 2+ BL Pulm:  Normal effort, clear to auscultation bilaterally Abd: Soft, mildly ttp in RUQ, non-distended, +BS Skin: warm and dry, no rashes  Psych: A&Ox3   Lab Results:  Recent Labs    12/12/18 0348 12/13/18 0312  WBC 8.7 8.1  HGB 8.9* 9.7*  HCT 27.4* 29.9*  PLT 359 371   BMET Recent Labs    12/12/18 0348 12/12/18 1918 12/13/18 0312  NA 135  --  133*  K 4.0 4.3 4.9  CL 99  --  100  CO2 25  --  23  GLUCOSE 95  --  95  BUN 24*  --  16  CREATININE 1.15  --  1.02  CALCIUM 8.6*  --  8.5*   PT/INR No results for input(s): LABPROT, INR in the last 72 hours. CMP     Component Value Date/Time   NA 133 (L) 12/13/2018 0312   NA 142 04/22/2017 1123   K 4.9 12/13/2018 0312   CL 100 12/13/2018 0312   CO2 23 12/13/2018 0312   GLUCOSE 95 12/13/2018 0312   BUN 16 12/13/2018 0312   BUN 8 04/22/2017 1123   CREATININE 1.02 12/13/2018 0312   CREATININE 1.40 (H) 12/03/2017 1407   CALCIUM 8.5 (L) 12/13/2018 0312   PROT 7.3 12/12/2018 0348   ALBUMIN 2.2 (L) 12/12/2018 0348   AST 24 12/12/2018 0348   ALT 24 12/12/2018  0348   ALT 45 05/21/2017 1520   ALKPHOS 105 12/12/2018 0348   BILITOT 0.7 12/12/2018 0348   GFRNONAA >60 12/13/2018 0312   GFRNONAA 69 05/21/2017 1510   GFRAA >60 12/13/2018 0312   GFRAA 80 05/21/2017 1510   Lipase  No results found for: LIPASE     Studies/Results: Nm Hepatobiliary Liver Func  Result Date: 12/12/2018 CLINICAL DATA:  Right upper quadrant pain.  Cholecystitis suspected. EXAM: NUCLEAR MEDICINE HEPATOBILIARY IMAGING TECHNIQUE: Sequential images of the abdomen were obtained out to 60 minutes following intravenous administration of radiopharmaceutical. RADIOPHARMACEUTICALS:  5.47 mCi Tc-57m  Choletec IV COMPARISON:  Ultrasound 12/11/2018 FINDINGS: Prompt uptake and biliary excretion of activity by the liver is seen. Biliary activity passes into small bowel, consistent with patent common bile duct. No gallbladder activity visualized within the first hour. The patient then received 3 mg of morphine the intravenous infusion and subsequent imaging was carried out for an additional 30 minutes. No gallbladder activity noted post morphine. IMPRESSION: 1. Nonvisualization of the gallbladder which may reflect cystic duct obstruction and acute cholecystitis. Electronically Signed   By: Kerby Moors M.D.   On: 12/12/2018 16:39   US Abdomen Limited Ruq  Result Date: 12/11/2018 CLINICAL DATA:  Right upper quadrant pain for 8-10 months EXAM: ULTRASOUND ABDOMEN LIMITED RIGHT UPPER QUADRANT COMPARISON:  CT chest 12/11/2018 FINDINGS: Gallbladder: The gallbladder is distended. The wall of the gallbladder is thickened measuring 3.9 mm. There is pericholecystic fluid. Sludge and stones identified within the gallbladder. The largest gallstone measures 2 cm. Negative sonographic Murphy's sign. Common bile duct: Diameter: 5.1 mm Liver: Increased parenchymal echogenicity. No focal liver abnormality. Portal vein is patent on color Doppler imaging with normal direction of blood flow towards the liver.  Other: Perihepatic ascites IMPRESSION: 1. Gallstones, gallbladder sludge and wall thickening concerning for acute cholecystitis. 2. Echogenic liver compatible with hepatic steatosis. Electronically Signed   By: Kerby Moors M.D.   On: 12/11/2018 16:44    Anti-infectives: Anti-infectives (From admission, onward)   Start     Dose/Rate Route Frequency Ordered Stop   12/07/18 0800  vancomycin (VANCOCIN) 1,500 mg in sodium chloride 0.9 % 500 mL IVPB  Status:  Discontinued     1,500 mg 250 mL/hr over 120 Minutes Intravenous Every 24 hours 12/06/18 1206 12/07/18 0931   12/06/18 0330  vancomycin (VANCOCIN) 1,500 mg in sodium chloride 0.9 % 500 mL IVPB     1,500 mg 250 mL/hr over 120 Minutes Intravenous  Once 12/06/18 0323 12/06/18 0557   12/03/18 2200  vancomycin (VANCOCIN) 1,500 mg in sodium chloride 0.9 % 500 mL IVPB  Status:  Discontinued     1,500 mg 250 mL/hr over 120 Minutes Intravenous Every 24 hours 12/02/18 2115 12/03/18 0850   12/03/18 2100  vancomycin (VANCOCIN) 1,250 mg in sodium chloride 0.9 % 250 mL IVPB  Status:  Discontinued     1,250 mg 166.7 mL/hr over 90 Minutes Intravenous Every 24 hours 12/03/18 0850 12/03/18 1009   12/02/18 2100  piperacillin-tazobactam (ZOSYN) IVPB 3.375 g     3.375 g 12.5 mL/hr over 240 Minutes Intravenous Every 8 hours 12/02/18 2040     12/02/18 2045  vancomycin (VANCOCIN) 1,750 mg in sodium chloride 0.9 % 500 mL IVPB     1,750 mg 250 mL/hr over 120 Minutes Intravenous  Once 12/02/18 2040 12/03/18 0100       Assessment/Plan HTN A. Fib on heparin gtt, s/p cardioversion 10/29/18 - heparin on hold for procedure CHF w/ AICD and EF 15-20% Hep C CKD stage III DVT s/p IVC filter CVA 11/10/18 RLL PNA  Acute cholecystitis - HIDA positive - patient not a surgical candidate - recommend IR consult for percutaneous cholecystostomy  - continue abx  FEN: NPO, IVF VTE: SCDs, heparin gtt on hold ID: Vanc 10/13-10/18; Zosyn 10/13>>  LOS: 11 days     Brigid Re , [facility] 12/13/2018, 8:54 AM Please see Amion for pager number during day hours 7:00am-4:30pm

## 2018-12-13 NOTE — Progress Notes (Signed)
SLP Cancellation Note  Patient Details Name: Glenn [name] MRN: [id number] DOB: 1950-11-20   Cancelled treatment:       Reason Eval/Treat Not Completed: Pt is currently NPO for a procedure later today. ST will follow up as appropriate.    Bretta Bang, M.S., [facility] Office: [phone]   [place] 12/13/2018, 7:29 AM

## 2018-12-13 NOTE — Progress Notes (Signed)
ANTICOAGULATION CONSULT NOTE  Pharmacy Consult forHeparin Indication: atrial fibrillation   Allergies  Allergen Reactions  . Benadryl [Diphenhydramine] Palpitations    Patient Measurements: Height: 5\' 7"  (170.2 cm) Weight: 179 lb 7.3 oz (81.4 kg) IBW/kg (Calculated) : 66.1 Heparin Dosing Weight: 85 kg   Vital Signs: Temp: 98.4 F (36.9 C) (10/24 0454) Temp Source: Oral (10/24 0454) BP: 136/50 (10/24 0454) Pulse Rate: 81 (10/24 0454)  Labs: Recent Labs    12/10/18 0739  12/11/18 0345 12/11/18 1512  12/12/18 0348 12/12/18 1520 12/13/18 0312  HGB 10.7*   < > 9.0* 9.0*  --  8.9*  --  9.7*  HCT 32.2*   < > 27.7* 27.9*  --  27.4*  --  29.9*  PLT 327   < > 363 363  --  359  --  371  HEPARINUNFRC 0.42   < > <0.10*  --    < > 0.22* <0.10* 0.20*  CREATININE 1.36*  --  1.12  --   --  1.15  --   --    < > = values in this interval not displayed.    Estimated Creatinine Clearance: 64.5 mL/min (by C-G formula based on SCr of 1.15 mg/dL).  Assessment: 68 y.o. male with AFib and recent DVT s/p IVC, Eliquis on hold, for heparin  Goal of Therapy:  Heparin level 0.3-0.5units/ml Monitor platelets by anticoagulation protocol: Yes  Plan: Increase Heparin 2300 units/hr Check heparin level in 8 hours.  Phillis Knack, PharmD, BCPS

## 2018-12-13 NOTE — Sedation Documentation (Signed)
Per dr Laurence Ferrari restart heparin in one hour at 1400

## 2018-12-13 NOTE — Progress Notes (Signed)
Chief Complaint: Patient was seen in consultation today for perc chole drain at the request of Dr. Wendee Beavers  Referring Physician(s): Dr. Wendee Beavers  Supervising Physician: Jacqulynn Cadet  Patient Status: [facility] - In-pt  History of Present Illness: Glenn [name] is a 68 y.o. male known to IR service for recent IVC filter placement. Pt with complicated hx and hospitalization.  Now with right sided abd pain. GB sludge and wall thickening noted on [place]. Surgery consulted and HIDA scan ordered. This shows non-vis of the Gb consistent with cholecystitis. Given pt comorbidities, he is not a surgical candidate, therefore IR is asked to place perc chole drain. PMHx, meds, labs, imaging, allergies reviewed. Has been on heparin drip. Has been NPO today as directed.    Past Medical History:  Diagnosis Date   Atrial fibrillation [facility])    CHF (congestive heart failure) ([place])    Hepatitis C    Hypertension    Stroke [facility])    Visit for monitoring Tikosyn therapy 03/26/2017    Past Surgical History:  Procedure Laterality Date   CARDIAC DEFIBRILLATOR PLACEMENT  2015   CARDIOVERSION N/A 10/10/2016   Procedure: CARDIOVERSION;  Surgeon: Dorothy Spark, MD;  Location: [place];  Service: Cardiovascular;  Laterality: N/A;   CARDIOVERSION N/A 03/27/2017   Procedure: CARDIOVERSION;  Surgeon: Jerline Pain, MD;  Location: [place];  Service: Cardiovascular;  Laterality: N/A;   CARDIOVERSION N/A 10/29/2018   Procedure: CARDIOVERSION;  Surgeon: Sanda Klein, MD;  Location: [place];  Service: Cardiovascular;  Laterality: N/A;   CARDIOVERSION N/A 11/05/2018   Procedure: CARDIOVERSION;  Surgeon: Nahser, Wonda Cheng, MD;  Location: [name];  Service: Cardiovascular;  Laterality: N/A;   EYE SURGERY Left 1990   IR IVC FILTER PLMT / S&I /IMG GUID/MOD SED  12/04/2018   IR PERCUTANEOUS ART THROMBECTOMY/INFUSION INTRACRANIAL INC DIAG ANGIO  09/05/2016   IR  RADIOLOGIST EVAL & MGMT  10/03/2016   RADIOLOGY WITH ANESTHESIA N/A 09/05/2016   Procedure: RADIOLOGY WITH ANESTHESIA;  Surgeon: Luanne Bras, MD;  Location: [place];  Service: Radiology;  Laterality: N/A;   RIGHT/LEFT HEART CATH AND CORONARY ANGIOGRAPHY N/A 11/03/2018   Procedure: RIGHT/LEFT HEART CATH AND CORONARY ANGIOGRAPHY;  Surgeon: Lorretta Harp, MD;  Location: [place] CV LAB;  Service: Cardiovascular;  Laterality: N/A;    Allergies: Benadryl [diphenhydramine]  Medications:  Current Facility-Administered Medications:    0.9 %  sodium chloride infusion, , Intravenous, PRN, Rush Farmer, MD, Stopped at 12/10/18 1036   acetaminophen (TYLENOL) tablet 1,000 mg, 1,000 mg, Oral, Q6H PRN, Olalere, Adewale A, MD, 1,000 mg at 12/12/18 0955   bisacodyl (DULCOLAX) suppository 10 mg, 10 mg, Rectal, Daily PRN, Olalere, Adewale A, MD, 10 mg at 12/04/18 1738   calcium carbonate (TUMS - dosed in mg elemental calcium) chewable tablet 200 mg of elemental calcium, 1 tablet, Oral, Daily PRN, Margaretha Seeds, MD, 200 mg of elemental calcium at 12/07/18 1635   chlorhexidine (PERIDEX) 0.12 % solution 15 mL, 15 mL, Mouth Rinse, BID, Ogan, Okoronkwo U, MD, 15 mL at 12/12/18 2257   Chlorhexidine Gluconate Cloth 2 % PADS 6 each, 6 each, Topical, Daily, Shellia Cleverly, MD, 6 each at 12/11/18 0745   diclofenac sodium (VOLTAREN) 1 % transdermal gel 2 g, 2 g, Topical, QID, Gonfa, Taye T, MD   digoxin (LANOXIN) tablet 0.3125 mg, 0.3125 mg, Oral, Daily, Skeet Latch, MD   [COMPLETED] diltiazem (CARDIZEM) 1 mg/mL load via infusion 10 mg, 10 mg, Intravenous,  Once, 10 mg at 12/08/18 1036 **AND** diltiazem (CARDIZEM) 125 mg in dextrose 5% 125 mL (1 mg/mL) infusion, 5-15 mg/hr, Intravenous, Continuous, Skeet Latch, MD, Last Rate: 5 mL/hr at 12/12/18 1633, 5 mg/hr at 12/12/18 1633   feeding supplement (ENSURE ENLIVE) (ENSURE ENLIVE) liquid 237 mL, 237 mL, Oral, TID BM, Margaretha Seeds,  MD, 237 mL at 12/12/18 1526   furosemide (LASIX) tablet 40 mg, 40 mg, Oral, Daily, Skeet Latch, MD, 40 mg at 12/12/18 1526   gabapentin (NEURONTIN) 250 MG/5ML solution 300 mg, 300 mg, Oral, Q8H, Margaretha Seeds, MD, 300 mg at 12/13/18 0525   heparin ADULT infusion 100 units/mL (25000 units/274mL sodium chloride 0.45%), 2,300 Units/hr, Intravenous, Continuous, Gonfa, Taye T, MD, Stopped at 12/13/18 1008   hydrALAZINE (APRESOLINE) tablet 25 mg, 25 mg, Oral, Q6H PRN, Anders Simmonds, MD, 25 mg at 12/07/18 2215   ipratropium (ATROVENT) nebulizer solution 0.5 mg, 0.5 mg, Nebulization, BID, Cyndia Skeeters, Taye T, MD, 0.5 mg at 12/13/18 0805   levalbuterol (XOPENEX) nebulizer solution 0.63 mg, 0.63 mg, Nebulization, BID, Cyndia Skeeters, Taye T, MD, 0.63 mg at 12/13/18 0805   MEDLINE mouth rinse, 15 mL, Mouth Rinse, q12n4p, Ogan, Okoronkwo U, MD, 15 mL at 12/12/18 1527   metoprolol succinate (TOPROL-XL) 24 hr tablet 200 mg, 200 mg, Oral, Daily, Skeet Latch, MD, 200 mg at 12/12/18 1525   metoprolol tartrate (LOPRESSOR) injection 5 mg, 5 mg, Intravenous, Q3H PRN, Shearon Stalls, Rahul P, PA-C, 5 mg at 12/08/18 1252   multivitamin with minerals tablet 1 tablet, 1 tablet, Oral, Daily, Margaretha Seeds, MD, 1 tablet at 12/12/18 1537   piperacillin-tazobactam (ZOSYN) IVPB 3.375 g, 3.375 g, Intravenous, Q8H, Gardner, Jared M, DO, Last Rate: 12.5 mL/hr at 12/13/18 0525, 3.375 g at 12/13/18 [id number]    Family History  Problem Relation Age of Onset   High blood pressure Mother    High blood pressure Father    Stroke Maternal Aunt    Heart disease Neg Hx     Social History   Socioeconomic History   Marital status: Single    Spouse name: Not on file   Number of children: Not on file   Years of education: 3 (some college)   Highest education level: Not on file  Occupational History   Occupation: disability  Social Designer, fashion/clothing strain: Not on file   Food insecurity    Worry: Not  on file    Inability: Not on file   Transportation needs    Medical: Not on file    Non-medical: Not on file  Tobacco Use   Smoking status: Current Some Day Smoker    Packs/day: 0.50    Types: Cigarettes   Smokeless tobacco: Never Used   Tobacco comment: a pack last three days  Substance and Sexual Activity   Alcohol use: Not Currently    Alcohol/week: 3.0 standard drinks    Types: 3 Cans of beer per week    Comment: pt stop drinking    Drug use: Yes    Frequency: 2.0 times per week    Types: Marijuana    Comment: stop smoking    Sexual activity: Yes    Partners: Female    Birth control/protection: Condom  Lifestyle   Physical activity    Days per week: Not on file    Minutes per session: Not on file   Stress: Not on file  Relationships   Social connections    Talks on phone: Not on file  Gets together: Not on file    Attends religious service: Not on file    Active member of club or organization: Not on file    Attends meetings of clubs or organizations: Not on file    Relationship status: Not on file  Other Topics Concern   Not on file  Social History Narrative   Not on file    Review of Systems: A 12 point ROS discussed and pertinent positives are indicated in the HPI above.  All other systems are negative.  Review of Systems  Vital Signs: BP (!) 136/50 (BP Location: Left Arm)    Pulse 81    Temp 98.4 F (36.9 C) (Oral)    Resp 17    Ht 5\' 7"  (1.702 m)    Wt 81.4 kg    SpO2 96%    BMI 28.11 kg/m   Physical Exam Constitutional:      Appearance: Normal appearance. He is not ill-appearing.  HENT:     Mouth/Throat:     Mouth: Mucous membranes are moist.     Pharynx: Oropharynx is clear.  Cardiovascular:     Rate and Rhythm: Normal rate. Rhythm irregular.     Heart sounds: Normal heart sounds.  Pulmonary:     Effort: Pulmonary effort is normal. No respiratory distress.     Breath sounds: Normal breath sounds.  Abdominal:     General:  Abdomen is flat.     Palpations: Abdomen is soft.     Comments: Mildly tender RUQ  Skin:    General: Skin is warm and dry.  Neurological:     General: No focal deficit present.     Mental Status: He is alert and oriented to person, place, and time.       Imaging: Ct Abdomen Pelvis Wo Contrast  Result Date: 12/02/2018 CLINICAL DATA:  Abdominal pain. Fever. Right flank pain. Assess for retroperitoneal bleeding. EXAM: CT ABDOMEN AND PELVIS WITHOUT CONTRAST TECHNIQUE: Multidetector CT imaging of the abdomen and pelvis was performed following the standard protocol without IV contrast. COMPARISON:  None. FINDINGS: Lower chest: Small right pleural effusion layering dependently. Mild atelectasis or infiltrate at the right lung base. Hepatobiliary: Liver parenchyma appears normal. There is vicarious excretion of contrast with opacification of the gallbladder. There are probably some pigment stones within the gallbladder. Pancreas: Normal Spleen: Normal Adrenals/Urinary Tract: Adrenal glands are normal. Kidneys are normal. Bladder is normal. Stomach/Bowel: No acute or significant bowel finding. Vascular/Lymphatic: Aortic atherosclerosis. No aneurysm. IVC is normal. No retroperitoneal adenopathy. No retroperitoneal hematoma. Reproductive: Normal Other: None Musculoskeletal: Chronic degenerative changes of the lumbar spine. IMPRESSION: 1. No evidence of retroperitoneal hematoma. 2. Small right pleural effusion layering dependently. Mild atelectasis or infiltrate at the right lung base. 3. Aortic atherosclerosis. 4. Gallstones in the gallbladder but no CT evidence of cholecystitis. 5. No intraperitoneal abscess.  No acute bowel pathology evident. Aortic Atherosclerosis (ICD10-I70.0). Electronically Signed   By: Nelson Chimes M.D.   On: 12/02/2018 19:52   Dg Chest 2 View  Result Date: 12/02/2018 CLINICAL DATA:  Shortness of breath EXAM: CHEST - 2 VIEW COMPARISON:  11/27/2018 FINDINGS: Left AICD remains in  place, unchanged. Cardiomegaly. No confluent opacities, effusions or edema. No acute bony abnormality. IMPRESSION: Cardiomegaly.  No active disease. Electronically Signed   By: Rolm Baptise M.D.   On: 12/02/2018 16:31   Dg Chest 2 View  Result Date: 11/27/2018 CLINICAL DATA:  Fluid overload smoker history of CHF EXAM: CHEST - 2 VIEW COMPARISON:  11/25/2018 FINDINGS: Left-sided pacing device as before. Cardiomegaly. No significant pleural effusion. Aortic atherosclerosis. Streaky atelectasis left base. IMPRESSION: Cardiomegaly without overt failure or significant pleural effusion. Streaky atelectasis left base. Electronically Signed   By: Donavan Foil M.D.   On: 11/27/2018 21:27   Dg Chest 2 View  Result Date: 11/25/2018 CLINICAL DATA:  Intermittent fever. EXAM: CHEST - 2 VIEW COMPARISON:  11/17/2018 FINDINGS: Lordotic technique demonstrated. Left-sided pacemaker unchanged. Lungs are adequately inflated and otherwise clear. Stable cardiomegaly. Remainder of the exam is unchanged. IMPRESSION: No acute cardiopulmonary disease. Stable cardiomegaly. Electronically Signed   By: Marin Olp M.D.   On: 11/25/2018 11:17   Dg Chest 2 View  Result Date: 11/17/2018 CLINICAL DATA:  Congestion and cough. EXAM: CHEST - 2 VIEW COMPARISON:  11/16/2018 FINDINGS: Left-sided pacemaker unchanged. Lungs are adequately inflated without consolidation or effusion. Mild cardiomegaly which is stable. Remainder the exam is unchanged. IMPRESSION: No active cardiopulmonary disease. Electronically Signed   By: Marin Olp M.D.   On: 11/17/2018 12:35   Dg Chest 2 View  Result Date: 11/16/2018 CLINICAL DATA:  Stroke last week.  Cough and fever. EXAM: CHEST - 2 VIEW COMPARISON:  November 10, 2018 FINDINGS: Stable AICD device and cardiomegaly. The hila and mediastinum are unchanged. No pulmonary nodules, masses, or focal infiltrates. IMPRESSION: No cause for cough or fever identified. Electronically Signed   By: Dorise Bullion  III M.D   On: 11/16/2018 16:48   Dg Abd 1 View  Result Date: 12/02/2018 CLINICAL DATA:  Right side abdominal pain and nausea for 1 week. EXAM: ABDOMEN - 1 VIEW COMPARISON:  None. FINDINGS: The bowel gas pattern is normal. No radio-opaque calculi or other significant radiographic abnormality are seen. IMPRESSION: Negative exam. Electronically Signed   By: Inge Rise M.D.   On: 12/02/2018 13:09   Ct Head Wo Contrast  Result Date: 12/07/2018 CLINICAL DATA:  Stroke, follow-up.  Recent hemorrhagic CVA. EXAM: CT HEAD WITHOUT CONTRAST TECHNIQUE: Contiguous axial images were obtained from the base of the skull through the vertex without intravenous contrast. COMPARISON:  CT head 11/11/2018 MR head 11/12/2018 FINDINGS: Brain: The right frontal MCA territory infarct is again seen. There is evolution of the hemorrhage previously noted in the right basal ganglia. Fluid levels are no longer present in the lateral ventricles. Mild prominence of the lateral ventricles is likely due to volume loss. Cortical hyperdensity within the infarct territory likely represents cortical laminar necrosis rather than hemorrhage. No new hemorrhage is present. The left hemisphere is within normal limits. The brainstem and cerebellum are within normal limits. Vascular: Atherosclerotic calcifications are present in the cavernous internal carotid arteries bilaterally. There is no hyperdense vessel. Skull: Calvarium is intact. No focal lytic or blastic lesions are present. Sinuses/Orbits: The paranasal sinuses and mastoid air cells are clear. The globes and orbits are within normal limits. IMPRESSION: 1. Expected evolution of basal ganglia hemorrhage. No hyperdense blood products remain. No new hemorrhage is present. 2. Resolution of intraventricular hemorrhage. 3. Prominent ventricular size likely reflects volume loss of the adjacent parenchyma. 4. Expected evolution of prominent anterior right MCA territory infarct with volume loss.  5. Cortical hyperdensity in the infarct territory likely reflects developing cortical laminar necrosis. No acute hemorrhage is present. Electronically Signed   By: San Morelle M.D.   On: 12/07/2018 17:53   Ct Chest Wo Contrast  Result Date: 12/11/2018 CLINICAL DATA:  Hypoxemia respiratory failure EXAM: CT CHEST WITHOUT CONTRAST TECHNIQUE: Multidetector CT imaging of the chest was  performed following the standard protocol without IV contrast. COMPARISON:  Multiple chest x-rays.  Abdominal CT from 9 days ago FINDINGS: Cardiovascular: Cardiomegaly with ICD lead into the right ventricular apex. No pericardial effusion. Aortic and coronary atherosclerosis. Mediastinum/Nodes: Negative for adenopathy or inflammation Lungs/Pleura: Consolidation in the right lower lobe with a degree of volume loss, progressed. Small layering right pleural effusion. Mild atelectatic opacity in the dependent left lower lobe. No pulmonary edema Upper Abdomen: Fat edema around the gallbladder which likely has a thickened wall. There is cholelithiasis. Musculoskeletal: No acute or aggressive finding IMPRESSION: 1. Right lower lobe pneumonia with superimposed atelectasis, progressed from abdominal CT 9 days ago. Small right pleural effusion that is layering. 2. Gallstones and new pericholecystic edema, please correlate for symptoms of cholecystitis. 3. Cardiomegaly without edema. Electronically Signed   By: Monte Fantasia M.D.   On: 12/11/2018 04:08   Nm Hepatobiliary Liver Func  Result Date: 12/12/2018 CLINICAL DATA:  Right upper quadrant pain.  Cholecystitis suspected. EXAM: NUCLEAR MEDICINE HEPATOBILIARY IMAGING TECHNIQUE: Sequential images of the abdomen were obtained out to 60 minutes following intravenous administration of radiopharmaceutical. RADIOPHARMACEUTICALS:  5.47 mCi Tc-44m  Choletec IV COMPARISON:  Ultrasound 12/11/2018 FINDINGS: Prompt uptake and biliary excretion of activity by the liver is seen. Biliary  activity passes into small bowel, consistent with patent common bile duct. No gallbladder activity visualized within the first hour. The patient then received 3 mg of morphine the intravenous infusion and subsequent imaging was carried out for an additional 30 minutes. No gallbladder activity noted post morphine. IMPRESSION: 1. Nonvisualization of the gallbladder which may reflect cystic duct obstruction and acute cholecystitis. Electronically Signed   By: Kerby Moors M.D.   On: 12/12/2018 16:39   Ir Ivc Filter Plmt / S&i /img Guid/mod Sed  Result Date: 12/04/2018 INDICATION: Acute occlusive left lower extremity DVT in the setting of acute hemorrhagic right MCA infarct. Patient cannot be anticoagulated EXAM: ULTRASOUND GUIDANCE FOR VASCULAR ACCESS IVC CATHETERIZATION AND VENOGRAM IVC FILTER INSERTION Date:  12/04/2018 12/04/2018 3:44 pm Radiologist:  Jerilynn Mages. Daryll Brod, MD Guidance:  Ultrasound and fluoroscopic CONTRAST:  50 cc Omnipaque 300 MEDICATIONS: 1% lidocaine local ANESTHESIA/SEDATION: 0 mg IV Versed; 50 mcg IV Fentanyl Moderate Sedation Time: None. The patient was continuously monitored during the procedure by the interventional radiology nurse under my direct supervision. FLUOROSCOPY TIME:  Fluoroscopy Time: 1 minutes 18 seconds (103 mGy). COMPLICATIONS: None immediate. PROCEDURE: Informed consent was obtained from the patient following explanation of the procedure, risks, benefits and alternatives. The patient understands, agrees and consents for the procedure. All questions were addressed. A time out was performed. Maximal barrier sterile technique utilized including caps, mask, sterile gowns, sterile gloves, large sterile drape, hand hygiene, and betadine prep. Under sterile condition and local anesthesia, right internal jugular venous access was performed with ultrasound. Over a guide wire, the IVC filter delivery sheath and inner dilator were advanced into the IVC just above the IVC bifurcation.  Contrast injection was performed for an IVC venogram. IVC VENOGRAM: The IVC is patent. No evidence of thrombus, stenosis, or occlusion. No variant venous anatomy. The renal veins are identified at L1-2. IVC FILTER INSERTION: Through the delivery sheath, the Bard Denali IVC filter was deployed in the infrarenal IVC at the L2-3 level just below the renal veins and above the IVC bifurcation. Contrast injection confirmed position. There is good apposition of the filter against the IVC. The delivery sheath was removed and hemostasis was obtained with compression for 5 minutes. The  patient tolerated the procedure well. No immediate complications. IMPRESSION: Ultrasound and fluoroscopically guided infrarenal IVC filter insertion. PLAN: This IVC filter is potentially retrievable. The patient will be assessed for filter retrieval by Interventional Radiology in approximately 8-12 weeks. Further recommendations regarding filter retrieval, continued surveillance or declaration of device permanence, will be made at that time. Electronically Signed   By: Jerilynn Mages.  Shick M.D.   On: 12/04/2018 15:53   Dg Chest Port 1 View  Result Date: 12/09/2018 CLINICAL DATA:  Respiratory failure. EXAM: PORTABLE CHEST 1 VIEW COMPARISON:  12/06/2018. FINDINGS: Interim removal of feeding tube. AICD in stable position. Stable cardiomegaly. Persistent right base infiltrate and right pleural effusion. No interim change. No pneumothorax. IMPRESSION: 1.  Interim removal of feeding tube. 2.  AICD in stable position.  Stable cardiomegaly. 3. Persistent right base infiltrate and right-sided pleural effusion. No interim change. Electronically Signed   By: Marcello Moores  Register   On: 12/09/2018 07:00   Dg Chest Port 1 View  Result Date: 12/06/2018 CLINICAL DATA:  Hemoptysis. EXAM: PORTABLE CHEST 1 VIEW COMPARISON:  12/04/2018 FINDINGS: Soft feeding tube enters the stomach. Pacemaker/AICD remains in place. Left chest remains clear. Persistent right effusion  with atelectasis of the mid and right lung. Effusion is probably larger than on the previous study. IMPRESSION: Persistent right effusion and atelectasis. The effusion appears larger than on the previous study. No other significant change. Electronically Signed   By: Nelson Chimes M.D.   On: 12/06/2018 10:14   Dg Chest Port 1 View  Result Date: 12/04/2018 CLINICAL DATA:  Right sided abd pain with sob EXAM: PORTABLE CHEST 1 VIEW COMPARISON:  12/02/2018 FINDINGS: Patient has LEFT-sided transvenous pacemaker with lead to the RIGHT ventricle. The heart is enlarged. Study quality is degraded by patient motion artifact.There is increased opacity at the RIGHT lung base which partially obscures the RIGHT hemidiaphragm and is consistent with pleural effusion and atelectasis or infiltrate. IMPRESSION: Increased opacity at the RIGHT lung base consistent with pleural effusion and atelectasis or infiltrate. Electronically Signed   By: [name] M.D.   On: 12/04/2018 11:45   Dg Chest Port 1 View  Result Date: 12/02/2018 CLINICAL DATA:  Respiratory failure EXAM: PORTABLE CHEST 1 VIEW COMPARISON:  12/02/2018, 11/27/2018 FINDINGS: Left-sided pacing device as before. Cardiomegaly with central vascular congestion and aortic atherosclerosis. No consolidation, pleural effusion or pneumothorax. IMPRESSION: Cardiomegaly with central vascular congestion Electronically Signed   By: Donavan Foil M.D.   On: 12/02/2018 22:25   Dg Abd Portable 1v  Result Date: 12/05/2018 CLINICAL DATA:  Encounter for feeding tube placement EXAM: PORTABLE ABDOMEN - 1 VIEW COMPARISON:  Yesterday FINDINGS: Feeding tube with tip over the proximal stomach. IVC filter in place. Normal bowel gas pattern. IMPRESSION: Feeding tube with tip over the proximal stomach. Electronically Signed   By: Monte Fantasia M.D.   On: 12/05/2018 11:31   Dg Abd Portable 1v  Result Date: 12/04/2018 CLINICAL DATA:  Right side abdominal pain EXAM: PORTABLE  ABDOMEN - 1 VIEW COMPARISON:  12/02/2018 FINDINGS: Mild diffuse gaseous distention of bowel may reflect mild ileus. No convincing evidence for bowel obstruction. No organomegaly, suspicious calcification or free air. No acute bony abnormality. IMPRESSION: Mild gaseous distention of bowel diffusely could reflect mild ileus. Electronically Signed   By: Rolm Baptise M.D.   On: 12/04/2018 11:44   Dg Hip Unilat With Pelvis 2-3 Views Right  Result Date: 11/13/2018 CLINICAL DATA:  Right hip pain. EXAM: DG HIP (WITH OR WITHOUT PELVIS) 2-3V  RIGHT COMPARISON:  None. FINDINGS: There is no evidence of hip fracture or dislocation. There is no evidence of arthropathy or other focal bone abnormality. IMPRESSION: Negative. Electronically Signed   By: Marijo Conception M.D.   On: 11/13/2018 15:05   Vas [place] Lower Extremity Venous (dvt)  Result Date: 12/03/2018  Lower Venous Study Indications: Swelling, and respiratory failure.  Comparison Study: No prior study Performing Technologist: Maudry Mayhew MHA, RDMS, RVT, RDCS  Examination Guidelines: A complete evaluation includes B-mode imaging, spectral Doppler, color Doppler, and power Doppler as needed of all accessible portions of each vessel. Bilateral testing is considered an integral part of a complete examination. Limited examinations for reoccurring indications may be performed as noted.  +---------+---------------+---------+-----------+----------+--------------+  RIGHT     Compressibility Phasicity Spontaneity Properties Thrombus Aging  +---------+---------------+---------+-----------+----------+--------------+  CFV       Full            Yes       Yes                                    +---------+---------------+---------+-----------+----------+--------------+  SFJ       Full                                                             +---------+---------------+---------+-----------+----------+--------------+  FV Prox   Full                                                              +---------+---------------+---------+-----------+----------+--------------+  FV Mid    Full                                                             +---------+---------------+---------+-----------+----------+--------------+  FV Distal Full                                                             +---------+---------------+---------+-----------+----------+--------------+  PFV       Full                                                             +---------+---------------+---------+-----------+----------+--------------+  POP       Full            Yes       Yes                                    +---------+---------------+---------+-----------+----------+--------------+  PTV       Full                                                             +---------+---------------+---------+-----------+----------+--------------+  PERO      Full                                                             +---------+---------------+---------+-----------+----------+--------------+  +---------+---------------+---------+-----------+----------+--------------+  LEFT      Compressibility Phasicity Spontaneity Properties Thrombus Aging  +---------+---------------+---------+-----------+----------+--------------+  CFV       Partial                   No                     Acute           +---------+---------------+---------+-----------+----------+--------------+  SFJ       Partial                                          Acute           +---------+---------------+---------+-----------+----------+--------------+  FV Prox   None                                             Acute           +---------+---------------+---------+-----------+----------+--------------+  FV Mid    None                                             Acute           +---------+---------------+---------+-----------+----------+--------------+  FV Distal None                                             Acute            +---------+---------------+---------+-----------+----------+--------------+  PFV       None                                             Acute           +---------+---------------+---------+-----------+----------+--------------+  POP       None                      No                     Acute           +---------+---------------+---------+-----------+----------+--------------+  PTV       None  No                     Acute           +---------+---------------+---------+-----------+----------+--------------+  PERO      None                      No                     Acute           +---------+---------------+---------+-----------+----------+--------------+  Gastroc   None                      No                     Acute           +---------+---------------+---------+-----------+----------+--------------+  EIV                                 Yes                                    +---------+---------------+---------+-----------+----------+--------------+  Unable to evaluate left common iliac vein and IVC due to patient position.  Summary: Right: There is no evidence of deep vein thrombosis in the lower extremity. No cystic structure found in the popliteal fossa. Left: Findings consistent with acute deep vein thrombosis involving the left common femoral vein, left femoral vein, left popliteal vein, left proximal profunda vein, left posterior tibial veins, left peroneal veins, and left gastrocnemius veins. No cystic structure found in the popliteal fossa.  *See table(s) above for measurements and observations. Electronically signed by Curt Jews MD on 12/03/2018 at 3:04:32 PM.    Final    Vas [place] Upper Extremity Venous Duplex  Result Date: 12/06/2018 UPPER VENOUS STUDY  Indications: Swelling Risk Factors: Current LE DVT with IVC filter. Performing Technologist: June Leap RDMS, RVT  Examination Guidelines: A complete evaluation includes B-mode imaging, spectral Doppler, color Doppler, and power  Doppler as needed of all accessible portions of each vessel. Bilateral testing is considered an integral part of a complete examination. Limited examinations for reoccurring indications may be performed as noted.  Right Findings: +-----+------------+---------+-----------+----------+--------------------------+  RIGHT Compressible Phasicity Spontaneous Properties          Summary            +-----+------------+---------+-----------+----------+--------------------------+  IJV                                                   unable to image due to                                                                positioning          +-----+------------+---------+-----------+----------+--------------------------+  Left Findings: +----------+------------+---------+-----------+----------+-------+  LEFT       Compressible Phasicity Spontaneous Properties Summary  +----------+------------+---------+-----------+----------+-------+  IJV  Full        Yes        Yes                         +----------+------------+---------+-----------+----------+-------+  Subclavian     Full        Yes        Yes                         +----------+------------+---------+-----------+----------+-------+  Axillary       Full        Yes        Yes                         +----------+------------+---------+-----------+----------+-------+  Brachial       Full        Yes        Yes                         +----------+------------+---------+-----------+----------+-------+  Radial         Full                                               +----------+------------+---------+-----------+----------+-------+  Ulnar          Full                                               +----------+------------+---------+-----------+----------+-------+  Cephalic       None                                      forearm  +----------+------------+---------+-----------+----------+-------+  Basilic        Full                                                +----------+------------+---------+-----------+----------+-------+  Summary:  Left: No evidence of deep vein thrombosis in the upper extremity. Findings consistent with acute superficial vein thrombosis involving the left cephalic vein.  *See table(s) above for measurements and observations.  Diagnosing physician: Harold Barban MD Electronically signed by Harold Barban MD on 12/06/2018 at 12:44:35 AM.    Final    US Abdomen Limited Ruq  Result Date: 12/11/2018 CLINICAL DATA:  Right upper quadrant pain for 8-10 months EXAM: ULTRASOUND ABDOMEN LIMITED RIGHT UPPER QUADRANT COMPARISON:  CT chest 12/11/2018 FINDINGS: Gallbladder: The gallbladder is distended. The wall of the gallbladder is thickened measuring 3.9 mm. There is pericholecystic fluid. Sludge and stones identified within the gallbladder. The largest gallstone measures 2 cm. Negative sonographic Murphy's sign. Common bile duct: Diameter: 5.1 mm Liver: Increased parenchymal echogenicity. No focal liver abnormality. Portal vein is patent on color Doppler imaging with normal direction of blood flow towards the liver. Other: Perihepatic ascites IMPRESSION: 1. Gallstones, gallbladder sludge and wall thickening concerning for acute cholecystitis. 2. Echogenic liver compatible with hepatic steatosis. Electronically Signed   By: Kerby Moors  M.D.   On: 12/11/2018 16:44    Labs:  CBC: Recent Labs    12/11/18 0345 12/11/18 1512 12/12/18 0348 12/13/18 0312  WBC 10.1 10.0 8.7 8.1  HGB 9.0* 9.0* 8.9* 9.7*  HCT 27.7* 27.9* 27.4* 29.9*  PLT 363 363 359 371    COAGS: Recent Labs    11/01/18 1814 11/03/18 0604 11/10/18 1753  11/11/18 0329 11/11/18 0953 12/02/18 2018 12/04/18 0333  INR  --   --  1.2   < > 1.2 1.3* 1.4* 1.4*  APTT 35 51* 31  --   --   --  31  --    < > = values in this interval not displayed.    BMP: Recent Labs    12/10/18 0739 12/11/18 0345 12/12/18 0348 12/12/18 1918 12/13/18 0312  NA 136 135 135  --  133*  K  3.7 3.9 4.0 4.3 4.9  CL 95* 99 99  --  100  CO2 27 26 25   --  23  GLUCOSE 115* 108* 95  --  95  BUN 39* 30* 24*  --  16  CALCIUM 8.7* 8.3* 8.6*  --  8.5*  CREATININE 1.36* 1.12 1.15  --  1.02  GFRNONAA 54* >60 >60  --  >60  GFRAA >60 >60 >60  --  >60    LIVER FUNCTION TESTS: Recent Labs    12/02/18 1528 12/02/18 2209 12/11/18 0345 12/12/18 0348  BILITOT 0.8 0.8 0.9 0.7  AST 25 26 32 24  ALT 16 16 29 24   ALKPHOS 68 61 102 105  PROT 7.4 6.5 6.7 7.3  ALBUMIN 3.1* 2.7* 2.0* 2.2*    TUMOR MARKERS: No results for input(s): AFPTM, CEA, CA199, CHROMGRNA in the last 8760 hours.  Assessment and Plan: Cholecystitis. Non surgical candidate. Plan for perc chole drain placement. Pt understands this may be long term tube as he may never be a surgical candidate. However, drain will need to stay a minimum of 6-8 weeks before consideration of cholangiogram/removal can be considered. Risks and benefits discussed with the patient including, but not limited to bleeding, infection, gallbladder perforation, bile leak, sepsis or even death.  All of the patient's questions were answered, patient is agreeable to proceed. Consent signed and in chart.    Thank you for this interesting consult.  I greatly enjoyed meeting Glenn [name] and look forward to participating in their care.  A copy of this report was sent to the requesting provider on this date.  Electronically Signed: Ascencion Dike, PA-C 12/13/2018, 10:25 AM   I spent a total of 25 minutes in face to face in clinical consultation, greater than 50% of which was counseling/coordinating care for perc chole drain

## 2018-12-13 NOTE — Progress Notes (Signed)
OT Cancellation Note  Patient Details Name: ENES HUDZIK MRN: [id number] DOB: 03-16-1950   Cancelled Treatment:    Reason Eval/Treat Not Completed: Patient at procedure or test/ unavailable. Percutaneous cholecystostomy tube placement. Plan to reattempt, likely tomorrow.  Tyrone Schimke, OT Acute Rehabilitation Services Pager: [phone] Office: [phone]  12/13/2018, 1:16 PM

## 2018-12-13 NOTE — Procedures (Signed)
Interventional Radiology Procedure Note  Procedure: Placement of a 25F percutaneous cholecystostomy tube.   Complications: None  Estimated Blood Loss: None  Recommendations: - Drain to gravity bag.  - Flush Q shift - IR clinic in 4-6 weeks for drain injection to assess patency of cystic and CB ducts.   Signed,  Criselda Peaches, MD

## 2018-12-14 DIAGNOSIS — I5022 Chronic systolic (congestive) heart failure: Secondary | ICD-10-CM

## 2018-12-14 LAB — PHOSPHORUS: Phosphorus: 3.4 mg/dL (ref 2.5–4.6)

## 2018-12-14 LAB — BASIC METABOLIC PANEL
Anion gap: 11 (ref 5–15)
BUN: 13 mg/dL (ref 8–23)
CO2: 22 mmol/L (ref 22–32)
Calcium: 8.8 mg/dL — ABNORMAL LOW (ref 8.9–10.3)
Chloride: 102 mmol/L (ref 98–111)
Creatinine, Ser: 0.95 mg/dL (ref 0.61–1.24)
GFR calc Af Amer: >60 mL/min (ref >60)
GFR calc non Af Amer: >60 mL/min (ref >60)
Glucose, Bld: 100 mg/dL — ABNORMAL HIGH (ref 70–99)
Potassium: 4.1 mmol/L (ref 3.5–5.1)
Sodium: 135 mmol/L (ref 135–145)

## 2018-12-14 LAB — CBC
HCT: 30.7 % — ABNORMAL LOW (ref 39.0–52.0)
Hemoglobin: 9.9 g/dL — ABNORMAL LOW (ref 13.0–17.0)
MCH: 30.5 pg (ref 26.0–34.0)
MCHC: 32.2 g/dL (ref 30.0–36.0)
MCV: 94.5 fL (ref 80.0–100.0)
Platelets: 442 10*3/uL — ABNORMAL HIGH (ref 150–400)
RBC: 3.25 MIL/uL — ABNORMAL LOW (ref 4.22–5.81)
RDW: 13.3 % (ref 11.5–15.5)
WBC: 9.2 10*3/uL (ref 4.0–10.5)
nRBC: 0 % (ref 0.0–0.2)

## 2018-12-14 LAB — MAGNESIUM: Magnesium: 2.1 mg/dL (ref 1.7–2.4)

## 2018-12-14 LAB — HEPARIN LEVEL (UNFRACTIONATED): Heparin Unfractionated: 0.35 IU/mL (ref 0.30–0.70)

## 2018-12-14 MED ORDER — DIGOXIN 250 MCG PO TABS
0.2500 mg | ORAL_TABLET | Freq: Every day | ORAL | Status: DC
  Administered 2018-12-15 – 2018-12-17 (×3): 0.25 mg via ORAL
  Filled 2018-12-14 (×3): qty 1

## 2018-12-14 MED ORDER — IPRATROPIUM BROMIDE 0.02 % IN SOLN: 0.5000 mg | Freq: Four times a day (QID) | RESPIRATORY_TRACT | Status: DC | PRN

## 2018-12-14 MED ORDER — SACUBITRIL-VALSARTAN 24-26 MG PO TABS
1.0000 | ORAL_TABLET | Freq: Two times a day (BID) | ORAL | Status: DC
  Administered 2018-12-14 – 2018-12-19 (×10): 1 via ORAL
  Filled 2018-12-14 (×10): qty 1

## 2018-12-14 MED ORDER — LEVALBUTEROL HCL 0.63 MG/3ML IN NEBU: 0.6300 mg | INHALATION_SOLUTION | Freq: Four times a day (QID) | RESPIRATORY_TRACT | Status: DC | PRN

## 2018-12-14 MED ORDER — GABAPENTIN 300 MG PO CAPS
300.0000 mg | ORAL_CAPSULE | Freq: Three times a day (TID) | ORAL | Status: DC
  Administered 2018-12-14 – 2018-12-24 (×30): 300 mg via ORAL
  Filled 2018-12-14 (×31): qty 1

## 2018-12-14 NOTE — TOC Progression Note (Signed)
Transition of Care [facility]) - Progression Note    Patient Details  Name: Glenn [name] MRN: [id number] Date of Birth: 1950-11-24  [facility] CM/SW [place], [place] Phone Number: [phone] 12/14/2018, 3:31 PM  Clinical Narrative:     CSW met with patient's son to discuss the bed offers. Patient's son chose [facility] for patient. CSW answered questions that patient's son posed. CSW attempted to reach out to [facility] however did not get an answer.  TOC team will continue to follow for discharge planning.   Expected Discharge Plan: Skilled Nursing Facility Barriers to Discharge: Ship broker, Continued Medical Work up  Expected Discharge Plan and Services Expected Discharge Plan: [place] In-house Referral: Clinical Social Work   Post Acute Care Choice: [place] Living arrangements for the past 2 months: Single Family Home                 DME Arranged: N/A DME Agency: NA       HH Arranged: NA HH Agency: NA         Social Determinants of Health (SDOH) Interventions    Readmission Risk Interventions No flowsheet data found.

## 2018-12-14 NOTE — Progress Notes (Signed)
[place] for Heparin Indication: atrial fibrillation, extensive DVT  Allergies  Allergen Reactions  . Benadryl [Diphenhydramine] Palpitations    Patient Measurements: Height: 5\' 7"  (170.2 cm) Weight: 179 lb 7.3 oz (81.4 kg) IBW/kg (Calculated) : 66.1 Heparin Dosing Weight: 85kg  Vital Signs: Temp: 98.5 F (36.9 C) (10/24 2019) Temp Source: Oral (10/24 2019) BP: 110/61 (10/25 0017) Pulse Rate: 89 (10/25 0017)  Labs: Recent Labs    12/11/18 0345 12/11/18 1512  12/12/18 0348  12/13/18 0312 12/13/18 1350 12/13/18 2312  HGB 9.0* 9.0*  --  8.9*  --  9.7*  --   --   HCT 27.7* 27.9*  --  27.4*  --  29.9*  --   --   PLT 363 363  --  359  --  371  --   --   HEPARINUNFRC <0.10*  --    < > 0.22*   < > 0.20* <0.10* 0.29*  CREATININE 1.12  --   --  1.15  --  1.02  --   --    < > = values in this interval not displayed.    Estimated Creatinine Clearance: 72.8 mL/min (by C-G formula based on SCr of 1.02 mg/dL).   Medical History: Past Medical History:  Diagnosis Date  . Atrial fibrillation ([place])   . CHF (congestive heart failure) ([place])   . Hepatitis C   . Hypertension   . Stroke ([place])   . Visit for monitoring Tikosyn therapy 03/26/2017     Assessment: 37 yoM with rapid AFib and DVT with hx recent hemorrhagic stroke. Pharmacy cleared to start IV heparin (low goal no bolus) by CCM and neurology with stable head CT on 10/19.  Heparin level remains therapeutic. CBC stable. No bleeding or issues with infusion reported.  10/25 AM update:  Heparin level just below goal after heparin re-start s/p IR placement of cholecystostomy tube, no issues per RN  Goal of Therapy:  Heparin level 0.3-0.5 units/ml Monitor platelets by anticoagulation protocol: Yes   Plan:  -Inc heparin to 2400 units/hr -Re-check heparin level at [place], PharmD, [place] Pharmacist Phone: [phone]

## 2018-12-14 NOTE — Progress Notes (Signed)
Pt's son, [name], continues to be belligerent with nursing attempting to locate phone in room. Security notified of Son's behavior and requested to come to room and complete report.

## 2018-12-14 NOTE — Progress Notes (Addendum)
Progress Note  Patient Name: Glenn [name] Date of Encounter: 12/14/2018  Primary Cardiologist: Virl Axe, MD   Subjective   Pt sleeping comfortably     Inpatient Medications    Scheduled Meds:  chlorhexidine  15 mL Mouth Rinse BID   Chlorhexidine Gluconate Cloth  6 each Topical Daily   diclofenac sodium  2 g Topical QID   digoxin  0.3125 mg Oral Daily   feeding supplement (ENSURE ENLIVE)  237 mL Oral TID BM   furosemide  40 mg Oral Daily   gabapentin  300 mg Oral Q8H   mouth rinse  15 mL Mouth Rinse q12n4p   metoprolol tartrate  25 mg Oral Q6H   multivitamin with minerals  1 tablet Oral Daily   pantoprazole  40 mg Oral Daily   saccharomyces boulardii  250 mg Oral BID   sodium chloride flush  5 mL Intracatheter Q8H   Continuous Infusions:  sodium chloride Stopped (12/10/18 1036)   diltiazem (CARDIZEM) infusion 5 mg/hr (12/12/18 1633)   heparin 2,400 Units/hr (12/14/18 0040)   piperacillin-tazobactam (ZOSYN)  IV 3.375 g (12/14/18 0851)   PRN Meds: sodium chloride, acetaminophen, bisacodyl, calcium carbonate, hydrALAZINE, ipratropium, levalbuterol, metoprolol tartrate   Vital Signs    Vitals:   12/13/18 2246 12/14/18 0017 12/14/18 0100 12/14/18 0455  BP:  110/61  (!) 160/66  Pulse: 96 89 66 94  Resp:  (!) 27 (!) 22 (!) 27  Temp:    98.3 F (36.8 C)  TempSrc:    Oral  SpO2:  100% 99% 100%  Weight:    79.8 kg  Height:        Intake/Output Summary (Last 24 hours) at 12/14/2018 1048 Last data filed at 12/14/2018 0650 Gross per 24 hour  Intake 475 ml  Output 720 ml  Net -245 ml   Last 3 Weights 12/14/2018 12/13/2018 12/12/2018  Weight (lbs) 175 lb 14.8 oz 179 lb 7.3 oz 182 lb 8.7 oz  Weight (kg) 79.8 kg 81.4 kg 82.8 kg      Telemetry    Afib   80s   - Personally Reviewed  ECG      Physical Exam   GEN: No acute distress.   Neck: No JVD Cardiac: Irreg irreg   no murmurs, rubs, or gallops.  Respiratory: Clear to  auscultation bilaterally. GI: Soft, nontender, non-distended  MS: No edema; No deformity. Neuro:  Nonfocal  Psych: Normal affect   Labs    High Sensitivity Troponin:   Recent Labs  Lab 12/02/18 2209 12/03/18 0225  TROPONINIHS 26* 25*      Chemistry Recent Labs  Lab 12/11/18 0345 12/12/18 0348 12/12/18 1918 12/13/18 0312 12/14/18 0413  NA 135 135  --  133* 135  K 3.9 4.0 4.3 4.9 4.1  CL 99 99  --  100 102  CO2 26 25  --  23 22  GLUCOSE 108* 95  --  95 100*  BUN 30* 24*  --  16 13  CREATININE 1.12 1.15  --  1.02 0.95  CALCIUM 8.3* 8.6*  --  8.5* 8.8*  PROT 6.7 7.3  --   --   --   ALBUMIN 2.0* 2.2*  --   --   --   AST 32 24  --   --   --   ALT 29 24  --   --   --   ALKPHOS 102 105  --   --   --   BILITOT 0.9  0.7  --   --   --   GFRNONAA >60 >60  --  >60 >60  GFRAA >60 >60  --  >60 >60  ANIONGAP 10 11  --  10 11     Hematology Recent Labs  Lab 12/12/18 0348 12/13/18 0312 12/14/18 0413  WBC 8.7 8.1 9.2  RBC 2.88* 3.18* 3.25*  HGB 8.9* 9.7* 9.9*  HCT 27.4* 29.9* 30.7*  MCV 95.1 94.0 94.5  MCH 30.9 30.5 30.5  MCHC 32.5 32.4 32.2  RDW 13.2 13.1 13.3  PLT 359 371 442*    BNPNo results for input(s): BNP, PROBNP in the last 168 hours.   DDimer No results for input(s): DDIMER in the last 168 hours.   Radiology    Nm Hepatobiliary Liver Func  Result Date: 12/12/2018 CLINICAL DATA:  Right upper quadrant pain.  Cholecystitis suspected. EXAM: NUCLEAR MEDICINE HEPATOBILIARY IMAGING TECHNIQUE: Sequential images of the abdomen were obtained out to 60 minutes following intravenous administration of radiopharmaceutical. RADIOPHARMACEUTICALS:  5.47 mCi Tc-20m  Choletec IV COMPARISON:  Ultrasound 12/11/2018 FINDINGS: Prompt uptake and biliary excretion of activity by the liver is seen. Biliary activity passes into small bowel, consistent with patent common bile duct. No gallbladder activity visualized within the first hour. The patient then received 3 mg of morphine the  intravenous infusion and subsequent imaging was carried out for an additional 30 minutes. No gallbladder activity noted post morphine. IMPRESSION: 1. Nonvisualization of the gallbladder which may reflect cystic duct obstruction and acute cholecystitis. Electronically Signed   By: Kerby Moors M.D.   On: 12/12/2018 16:39   Ir Perc Cholecystostomy  Result Date: 12/13/2018 INDICATION: 68 year old male with acute calculus cholecystitis. He is currently not an operative candidate and therefore presents for percutaneous cholecystostomy tube placement. EXAM: CHOLECYSTOSTOMY MEDICATIONS: Patient is currently receiving intravenous antibiotics on a schedule. No additional antibiotic prophylaxis was administered. ANESTHESIA/SEDATION: Moderate (conscious) sedation was employed during this procedure. A total of Versed 1.5 mg and Fentanyl 75 mcg was administered intravenously. Moderate Sedation Time: 10 minutes. The patient's level of consciousness and vital signs were monitored continuously by radiology nursing throughout the procedure under my direct supervision. FLUOROSCOPY TIME:  Fluoroscopy Time: 0 minutes 48 seconds (5 mGy). COMPLICATIONS: None immediate. PROCEDURE: Informed written consent was obtained from the patient after a thorough discussion of the procedural risks, benefits and alternatives. All questions were addressed. Maximal Sterile Barrier Technique was utilized including caps, mask, sterile gowns, sterile gloves, sterile drape, hand hygiene and skin antiseptic. A timeout was performed prior to the initiation of the procedure. The right upper quadrant was interrogated with ultrasound. The gallbladder is filled with sludge, debris and small stones. The wall is thickened and striated. A suitable skin entry site was selected and marked. The overlying skin was sterilely prepped and draped in the standard fashion using chlorhexidine skin prep. Local anesthesia was attained by infiltration with 1% lidocaine. A  small dermatotomy was made. Under real-time sonographic guidance, a 21 gauge Accustick needle was advanced along a short transhepatic course and into the gallbladder lumen. A wire was advanced in the gallbladder lumen. The needle was exchanged for the Accustick sheath which was advanced over the wire and into the gallbladder lumen. A gentle hand injection of contrast material confirmed that the sheath was indeed within the gallbladder lumen. A short Amplatz wire was then advanced and coiled in the gallbladder lumen. The Accustick sheath was removed. The skin tract was dilated to 10 [place]. A Cook 10.2 [place] all-purpose drainage  catheter was advanced over the wire and formed. Aspiration yields thick black bile. A sample was sent for Gram stain and culture. The catheter was gently flushed with saline and connected to gravity bag drainage. The catheter was secured to the skin with 0 Prolene suture. The patient tolerated the procedure well. IMPRESSION: Successful placement of a percutaneous transhepatic cholecystostomy tube for acute calculus cholecystitis in a poor operative candidate. PLAN: 1. Maintain tube to gravity bag drainage. 2. Follow-up with Interventional Radiology in 4 weeks for through the tube cholangiogram to assess patency of the cystic and common bile ducts. Electronically Signed   By: Jacqulynn Cadet M.D.   On: 12/13/2018 16:08    Cardiac Studies   Echo:  12/07/18   1. Left ventricular ejection fraction, by visual estimation, is 20%. The left ventricle has severely decreased function. Mildly increased left ventricular size. There is mildly increased left ventricular hypertrophy.  2. Left ventricular diastolic Doppler parameters are indeterminate in the setting of rapid atrial fibrillation.  3. Global right ventricle has mildly reduced systolic function.The right ventricular size is normal. No increase in right ventricular wall thickness.  4. Left atrial size was mild-moderately dilated.   5. Right atrial size was normal.  6. Trivial pericardial effusion is present.  7. The pericardial effusion is posterior to the left ventricle.  8. Mild aortic valve annular calcification.  9. The mitral valve is grossly normal. Mild mitral valve regurgitation. 10. The tricuspid valve is grossly normal. Tricuspid valve regurgitation mild-moderate. 11. The aortic valve is tricuspid Aortic valve regurgitation is mild to moderate by color flow Doppler. 12. The pulmonic valve was grossly normal. Pulmonic valve regurgitation is mild by color flow Doppler. 13. Moderately elevated pulmonary artery systolic pressure. 14. The inferior vena cava is normal in size with greater than 50% respiratory variability, suggesting right atrial pressure of 3 mmHg. 15. A device wire is visualized. 16. The tricuspid regurgitant velocity is 3.68 m/s, and with an assumed right atrial pressure of 3 mmHg, the estimated right ventricular systolic pressure is moderately elevated at 57.2 mmHg. 17. No obvious vegetations involving valves or device wire.  Patient Profile  68 y.o. male with nonischemic cardiomyopathy status post ICD, persistent atrial fibrillation, left lower extremity DVT status post IVC filter, recent hemorrhagic stroke, CKD III, admitted with persistent fever and respiratory failure thought to be due to pneumonia. Cardiology has been assisting with atrial fibrillation with rapid ventricular response.  Assessment & Plan    1  Atrial fibrillation  Rates controlled now post drainage   Was difficult pror    He is now on Dig  Will decrease Dig to 0.25   Metoprolol 25 q 6    Has IV prn     Off of diltiazem    Also on heparin    Continue   WIll consolidate as he takes PO. Need comment from neurosurgery before transitioning to oral agent (NOAC) Will follow and consolidate as taking PO  Long term possible cardioversion at some point    2  Chronic systolic CHF  Volume status is not bad    He is on currently on  po digoxin, hydralazine, metoprolol   PO ordersare  just starting     Prior to admit was on coreg, entresto, lasix 40, aldactone12.5 WOuld stop hydralazine   Would continue metoprolol for now for rates   Would not resume lasix or aldactone for now Follow I/O closely and wts  Prob resume lasix tomorrow Would keep on Dig  for now with afib    3  GI  S/P drainage GB in IR yesterday  4  Hx DVT  Extensive   With IVC filter   On heparin   Eventual Eliquis   IR to reassess timing for filter removal  5  CVA   MCA hemorrhagic CVA with L hemiparesis/dysphagia  Hosp 9/21 to 10/13         For questions or updates, please contact [place] Please consult [email or web address] for contact info under        Signed, Dorris Carnes, MD  12/14/2018, 10:48 AM

## 2018-12-14 NOTE — Progress Notes (Signed)
Pt 's cell phone was found by nursing and had been at nurses station on charger. Cell phone return to pt by security. Security to follow up with previous units regarding apparently, one "lost" black shoe. Pt's son advised to take belongings home while in hospital . Son refusing to take phone home. Son and pt made aware that the hospital is not responsible for lost items. Items can be placed in security if they are not taken home. Verbalized understanding.  Pt's son taking a ring and two quarters home.

## 2018-12-14 NOTE — Progress Notes (Signed)
Chief Complaint: Patient was seen today for per chole drain  Supervising Physician: Jacqulynn Cadet  Patient Status: [facility] - In-pt  Subjective: S/p perc chole drain yesterday States he feels a little worse today, more achy.  Objective: Physical Exam: BP (!) 160/66 (BP Location: Left Arm)   Pulse 94   Temp 98.3 F (36.8 C) (Oral)   Resp (!) 27   Ht 5\' 7"  (1.702 m)   Wt 79.8 kg   SpO2 100%   BMI 27.55 kg/m  RUQ drain intact, site clean, mildly tender Output dark bilous with some debris   Current Facility-Administered Medications:  .  0.9 %  sodium chloride infusion, , Intravenous, PRN, Rush Farmer, MD, Stopped at 12/10/18 1036 .  acetaminophen (TYLENOL) tablet 1,000 mg, 1,000 mg, Oral, Q6H PRN, Olalere, Adewale A, MD, 1,000 mg at 12/12/18 0955 .  bisacodyl (DULCOLAX) suppository 10 mg, 10 mg, Rectal, Daily PRN, Olalere, Adewale A, MD, 10 mg at 12/04/18 1738 .  calcium carbonate (TUMS - dosed in mg elemental calcium) chewable tablet 200 mg of elemental calcium, 1 tablet, Oral, Daily PRN, Margaretha Seeds, MD, 200 mg of elemental calcium at 12/07/18 1635 .  chlorhexidine (PERIDEX) 0.12 % solution 15 mL, 15 mL, Mouth Rinse, BID, Ogan, Okoronkwo U, MD, 15 mL at 12/14/18 0849 .  Chlorhexidine Gluconate Cloth 2 % PADS 6 each, 6 each, Topical, Daily, Shellia Cleverly, MD, 6 each at 12/11/18 0745 .  diclofenac sodium (VOLTAREN) 1 % transdermal gel 2 g, 2 g, Topical, QID, Cyndia Skeeters, Taye T, MD, 2 g at 12/14/18 0850 .  digoxin (LANOXIN) tablet 0.3125 mg, 0.3125 mg, Oral, Daily, Skeet Latch, MD, 0.3125 mg at 12/14/18 0845 .  [COMPLETED] diltiazem (CARDIZEM) 1 mg/mL load via infusion 10 mg, 10 mg, Intravenous, Once, 10 mg at 12/08/18 1036 **AND** diltiazem (CARDIZEM) 125 mg in dextrose 5% 125 mL (1 mg/mL) infusion, 5-15 mg/hr, Intravenous, Continuous, Skeet Latch, MD, Last Rate: 5 mL/hr at 12/12/18 1633, 5 mg/hr at 12/12/18 1633 .  feeding supplement (ENSURE ENLIVE) (ENSURE  ENLIVE) liquid 237 mL, 237 mL, Oral, TID BM, Margaretha Seeds, MD, 237 mL at 12/13/18 1930 .  furosemide (LASIX) tablet 40 mg, 40 mg, Oral, Daily, Skeet Latch, MD, 40 mg at 12/14/18 0849 .  gabapentin (NEURONTIN) capsule 300 mg, 300 mg, Oral, Q8H, Pahwani, Ravi, MD, 300 mg at 12/14/18 0845 .  heparin ADULT infusion 100 units/mL (25000 units/238mL sodium chloride 0.45%), 2,400 Units/hr, Intravenous, Continuous, Erenest Blank, RPH, Last Rate: 24 mL/hr at 12/14/18 0040, 2,400 Units/hr at 12/14/18 0040 .  hydrALAZINE (APRESOLINE) tablet 25 mg, 25 mg, Oral, Q6H PRN, Anders Simmonds, MD, 25 mg at 12/07/18 2215 .  ipratropium (ATROVENT) nebulizer solution 0.5 mg, 0.5 mg, Nebulization, Q6H PRN, Pahwani, Ravi, MD .  levalbuterol (XOPENEX) nebulizer solution 0.63 mg, 0.63 mg, Nebulization, Q6H PRN, Pahwani, Ravi, MD .  MEDLINE mouth rinse, 15 mL, Mouth Rinse, q12n4p, Ogan, Okoronkwo U, MD, 15 mL at 12/12/18 1527 .  metoprolol tartrate (LOPRESSOR) injection 5 mg, 5 mg, Intravenous, Q3H PRN, Desai, Rahul P, PA-C, 5 mg at 12/08/18 1252 .  metoprolol tartrate (LOPRESSOR) tablet 25 mg, 25 mg, Oral, Q6H, Milus Banister, MD, 25 mg at 12/14/18 0543 .  multivitamin with minerals tablet 1 tablet, 1 tablet, Oral, Daily, Margaretha Seeds, MD, 1 tablet at 12/14/18 0845 .  pantoprazole (PROTONIX) EC tablet 40 mg, 40 mg, Oral, Daily, Cyndia Skeeters, Taye T, MD, 40 mg at 12/14/18 0848 .  piperacillin-tazobactam (ZOSYN) IVPB 3.375 g, 3.375 g, Intravenous, Q8H, Gardner, Jared M, DO, Last Rate: 12.5 mL/hr at 12/14/18 0851, 3.375 g at 12/14/18 0851 .  saccharomyces boulardii (FLORASTOR) capsule 250 mg, 250 mg, Oral, BID, Cyndia Skeeters, Taye T, MD, 250 mg at 12/14/18 0849 .  sodium chloride flush (NS) 0.9 % injection 5 mL, 5 mL, Intracatheter, Q8H, Jacqulynn Cadet, MD, 5 mL at 12/14/18 0544  Labs: CBC Recent Labs    12/13/18 0312 12/14/18 0413  WBC 8.1 9.2  HGB 9.7* 9.9*  HCT 29.9* 30.7*  PLT 371 442*   BMET Recent Labs     12/13/18 0312 12/14/18 0413  NA 133* 135  K 4.9 4.1  CL 100 102  CO2 23 22  GLUCOSE 95 100*  BUN 16 13  CREATININE 1.02 0.95  CALCIUM 8.5* 8.8*   LFT Recent Labs    12/12/18 0348  PROT 7.3  ALBUMIN 2.2*  AST 24  ALT 24  ALKPHOS 105  BILITOT 0.7   PT/INR No results for input(s): LABPROT, INR in the last 72 hours.   Studies/Results: Nm Hepatobiliary Liver Func  Result Date: 12/12/2018 CLINICAL DATA:  Right upper quadrant pain.  Cholecystitis suspected. EXAM: NUCLEAR MEDICINE HEPATOBILIARY IMAGING TECHNIQUE: Sequential images of the abdomen were obtained out to 60 minutes following intravenous administration of radiopharmaceutical. RADIOPHARMACEUTICALS:  5.47 mCi Tc-33m  Choletec IV COMPARISON:  Ultrasound 12/11/2018 FINDINGS: Prompt uptake and biliary excretion of activity by the liver is seen. Biliary activity passes into small bowel, consistent with patent common bile duct. No gallbladder activity visualized within the first hour. The patient then received 3 mg of morphine the intravenous infusion and subsequent imaging was carried out for an additional 30 minutes. No gallbladder activity noted post morphine. IMPRESSION: 1. Nonvisualization of the gallbladder which may reflect cystic duct obstruction and acute cholecystitis. Electronically Signed   By: Kerby Moors M.D.   On: 12/12/2018 16:39   Ir Perc Cholecystostomy  Result Date: 12/13/2018 INDICATION: 68 year old male with acute calculus cholecystitis. He is currently not an operative candidate and therefore presents for percutaneous cholecystostomy tube placement. EXAM: CHOLECYSTOSTOMY MEDICATIONS: Patient is currently receiving intravenous antibiotics on a schedule. No additional antibiotic prophylaxis was administered. ANESTHESIA/SEDATION: Moderate (conscious) sedation was employed during this procedure. A total of Versed 1.5 mg and Fentanyl 75 mcg was administered intravenously. Moderate Sedation Time: 10 minutes. The  patient's level of consciousness and vital signs were monitored continuously by radiology nursing throughout the procedure under my direct supervision. FLUOROSCOPY TIME:  Fluoroscopy Time: 0 minutes 48 seconds (5 mGy). COMPLICATIONS: None immediate. PROCEDURE: Informed written consent was obtained from the patient after a thorough discussion of the procedural risks, benefits and alternatives. All questions were addressed. Maximal Sterile Barrier Technique was utilized including caps, mask, sterile gowns, sterile gloves, sterile drape, hand hygiene and skin antiseptic. A timeout was performed prior to the initiation of the procedure. The right upper quadrant was interrogated with ultrasound. The gallbladder is filled with sludge, debris and small stones. The wall is thickened and striated. A suitable skin entry site was selected and marked. The overlying skin was sterilely prepped and draped in the standard fashion using chlorhexidine skin prep. Local anesthesia was attained by infiltration with 1% lidocaine. A small dermatotomy was made. Under real-time sonographic guidance, a 21 gauge Accustick needle was advanced along a short transhepatic course and into the gallbladder lumen. A wire was advanced in the gallbladder lumen. The needle was exchanged for the Accustick sheath which was advanced over  the wire and into the gallbladder lumen. A gentle hand injection of contrast material confirmed that the sheath was indeed within the gallbladder lumen. A short Amplatz wire was then advanced and coiled in the gallbladder lumen. The Accustick sheath was removed. The skin tract was dilated to 10 [place]. A Cook 10.2 [place] all-purpose drainage catheter was advanced over the wire and formed. Aspiration yields thick black bile. A sample was sent for Gram stain and culture. The catheter was gently flushed with saline and connected to gravity bag drainage. The catheter was secured to the skin with 0 Prolene suture. The patient  tolerated the procedure well. IMPRESSION: Successful placement of a percutaneous transhepatic cholecystostomy tube for acute calculus cholecystitis in a poor operative candidate. PLAN: 1. Maintain tube to gravity bag drainage. 2. Follow-up with Interventional Radiology in 4 weeks for through the tube cholangiogram to assess patency of the cystic and common bile ducts. Electronically Signed   By: Jacqulynn Cadet M.D.   On: 12/13/2018 16:08    Assessment/Plan: Cholecystitis S/p perc chole 10/24 Drain intact, good function. IR following    LOS: 12 days   I spent a total of 15 minutes in face to face in clinical consultation, greater than 50% of which was counseling/coordinating care for perc chole drain  Ascencion Dike PA-C 12/14/2018 9:39 AM

## 2018-12-14 NOTE — TOC Progression Note (Signed)
Transition of Care [facility]) - Progression Note    Patient Details  Name: Glenn [name] MRN: [id number] Date of Birth: 25-Mar-1950  Transition of Care [facility]) CM/SW [place], [place] Phone Number: [phone] 12/14/2018, 9:46 AM  Clinical Narrative:     CSW met with patient bedside to discuss the bed offers. Patient informed CSW that he would like for his son to assist him with making the decision. CSW called son and he informed CSW that he would be at the hospital around 2pm. CSW asked son to call CSW when he arrives at hospital to discuss the bed offers.  CSW will continue to follow for discharge planning.  Expected Discharge Plan: Skilled Nursing Facility Barriers to Discharge: Ship broker, Continued Medical Work up  Expected Discharge Plan and Services Expected Discharge Plan: [place] In-house Referral: Clinical Social Work   Post Acute Care Choice: [place] Living arrangements for the past 2 months: Single Family Home                 DME Arranged: N/A DME Agency: NA       HH Arranged: NA HH Agency: NA         Social Determinants of Health (SDOH) Interventions    Readmission Risk Interventions No flowsheet data found.

## 2018-12-14 NOTE — Progress Notes (Signed)
[place] for Heparin Indication: atrial fibrillation, extensive DVT  Allergies  Allergen Reactions  . Benadryl [Diphenhydramine] Palpitations    Patient Measurements: Height: 5\' 7"  (170.2 cm) Weight: 175 lb 14.8 oz (79.8 kg) IBW/kg (Calculated) : 66.1 Heparin Dosing Weight: 85kg  Vital Signs: Temp: 98.3 F (36.8 C) (10/25 0455) Temp Source: Oral (10/25 0455) BP: 160/66 (10/25 0455) Pulse Rate: 94 (10/25 0455)  Labs: Recent Labs    12/12/18 0348  12/13/18 0312 12/13/18 1350 12/13/18 2312 12/14/18 0413 12/14/18 0824  HGB 8.9*  --  9.7*  --   --  9.9*  --   HCT 27.4*  --  29.9*  --   --  30.7*  --   PLT 359  --  371  --   --  442*  --   HEPARINUNFRC 0.22*   < > 0.20* <0.10* 0.29*  --  0.35  CREATININE 1.15  --  1.02  --   --  0.95  --    < > = values in this interval not displayed.    Estimated Creatinine Clearance: 77.5 mL/min (by C-G formula based on SCr of 0.95 mg/dL).   Medical History: Past Medical History:  Diagnosis Date  . Atrial fibrillation ([place])   . CHF (congestive heart failure) ([place])   . Hepatitis C   . Hypertension   . Stroke ([place])   . Visit for monitoring Tikosyn therapy 03/26/2017     Assessment: 68 year old male with rapid AFib and DVT with recent hemorrhagic stroke. Pharmacy cleared to start IV heparin (low goal no bolus) by CCM and neurology with stable head CT on 10/19. Heparin restarted after IR placement of cholecystostomy tube.  Heparin level therapeutic at 0.35. CBC stable. No bleeding reported.  Goal of Therapy:  Heparin level 0.3-0.5 units/ml Monitor platelets by anticoagulation protocol: Yes   Plan:  Continue heparin 2400 units/hour Daily heparin levels and CBC      Thank you for allowing pharmacy to participate in this patient's care.  Neill Jurewicz L. Devin Going, [place] PGY1 Pharmacy Resident 12/14/18      9:53 AM  Please check AMION for all [place] phone numbers After 10:00 PM,  call the [facility] [facility] [phone]

## 2018-12-14 NOTE — Progress Notes (Signed)
Pt's son, [name], in to visit pt at bedside. States that pt's cell phone is missing. Pt states that the phone was in the room when he visited a couple of days ago. Nursing offered to look through pt's belonging to try to locate cell phone, but Mr Kenton Kingfisher adamant that cell phone is not in pt's belongings and begins to raise his voice and become belligerent with staff. Pt and son assured by nursing that action will be taken to attempt to locate the item in his pt room and from any other units where he was previous.  Pt's son then comes out to nursing station raising his voice about the missing cell phone to the secretary and charge nurse. Son instructed to return to room where this issue will be addressed. Once in room Son continues to be loud with staff. Son warned at this time that if he does not calm down and lower his voice that Security will be called.

## 2018-12-14 NOTE — Progress Notes (Signed)
PROGRESS NOTE  Glenn [name] [id number] DOB: [birth year]   PCP: [name], MD  Patient is from: CIR. lives with friends before hospitalization.  DOA: 12/02/2018 LOS: 12  Brief Narrative / Interim history: 68 year old male with history of HTN, HLD, A. fib previously on Eliquis, EF 15-20% s/p ICD and cardioversion 10/29/2018, Hep C, CKD-III, recently hospitalized 9/21-10/13 for right MCA subacute hemorrhagic CVA and discharged to CIR. He returns the same date with A. fib with RVR and abdominal pain. Admitted to ICU for A. fib with RVR, sepsis likely due to aspiration pneumonia, AoC CHF, extensive DVT with IVC placement.  He is nowon room air. Still full code.  Cardiology following.  TRH reassumed care on 12/10/2018.  Repeat CT chest without contrast on 10/21 with progressive RLL pneumonia and possible cholecystitis.  RUQ [place] and HIDA positive for acute cholecystitis.  General surgery and IR following.  Plan for percutaneous biliary drainage and fluid culture  Assessment & Plan: Sepsis due to RLL pneumonia/aspiration pneumonia and acute cholecystitis: last fever 101.6 on 10/19.  Leukocytosis resolved.  Right upper quadrant ultrasound and HIDA positive for acute cholecystitis. -Completed ceftriaxone course on 10/8 previous admission -Vancomycin 10/13-10/18 -Started on Zosyn 10/13 for pneumonia, now with cholecystitis. -Previous hospitalist discussed with ID, Dr. Graylon Good over the phone who recommended continuing Zosyn pending biliary culture.  Seen by general surgery.  Per them, not a surgical candidate.  Status post percutaneous cholecystostomy drain placed by IR on 12/13/2018.  IR and general surgery following.  A. fib with RVR: Rates around 90s. -Cardiology managing.  On Cardizem drip, digoxin and metoprolol and heparin drip.  Cardiology following management per them.  Appreciate their help.  Will need to be switched to Eliquis once cleared by neurosurgery.  Acute on chronic  systolic CHF: Echo [id number] with LVEF 15-20%.  ICD status. -On p.o. Lasix per Cardiology -Entresto on hold so far while he was n.p.o. yesterday.  Now on diet.  Will defer to cardiology about the decision.  Extensive DVT s/p IVC filter -Continue heparin -Eventually transition to Eliquis. -IR to reassess for filter retrieval in about 8 to 12 weeks.  Acute respiratory failure with hypoxia: Multifactorial-pneumonia, A. fib with RVR and CHF. On room air now. -Continue bronchodilators-Xopenex and Atrovent -Antibiotics as above -BiPAP at night  Right pleural effusion: Noted on CXR and CT abdomen and pelvis.  Reported as small on repeat CT 10/21.  -Treat underlying pneumonia  History of MCA hemorrhagic stroke with residual left hemiparesis/dysphagia: Hospitalized for this 9/21-10/13 and discharged to CIR. Returns with A. fib with RVR and sepsis as above -CT head 10/18-expected evolution of BG hemorrhage and prominent anterior right MCA infarct.  Resolved lesion of IVH -Closely monitor while on heparin -PT/OT.  Normocytic anemia: Likely due to acute on chronic illness and blood loss.  No obvious bleeding now.  No hematochezia or melena per RN.  H&H stable. -Continue monitoring  History of hepatitis C -Outpatient follow-up  AKI: previously thought to have CKD-3.  Now creatinine and GFR within normal. -Continue monitoring   Dysphagia -Dysphagia 1 diet and MBS per SLP recommendation.  Malnutrition Type/Characteristics and interventions: Nutrition Problem: Inadequate oral intake Etiology: inability to eat  Signs/Symptoms: NPO status  Interventions: Ensure Enlive (each supplement provides 350kcal and 20 grams of protein), MVI, Magic cup, Hormel Shake   Goal of care discussion:  Following discussion took place between previous hospitalist and son.    "10/22-discussed about the CODE STATUS.  Patient defers decision to his son,  [name].  Reached out to [name] over the phone and  discussed the risk and benefits of CPR/intubation. [name] says, "do what it takes to keep my father alive". "  DVT prophylaxis: On heparin drip Code Status: Full code Family Communication: Plan of care discussed with patient.  We will try to call son later today. Disposition Plan: Remains inpatient. Consultants: PCCM, cardiology, ID (over the phone), GS and IR   Subjective: Patient seen and examined.  No new complaint.  Denied any abdominal pain, chest pain or shortness of breath.  Objective: Vitals:   12/13/18 2246 12/14/18 0017 12/14/18 0100 12/14/18 0455  BP:  110/61  (!) 160/66  Pulse: 96 89 66 94  Resp:  (!) 27 (!) 22 (!) 27  Temp:    98.3 F (36.8 C)  TempSrc:    Oral  SpO2:  100% 99% 100%  Weight:    79.8 kg  Height:        Intake/Output Summary (Last 24 hours) at 12/14/2018 1003 Last data filed at 12/14/2018 0650 Gross per 24 hour  Intake 475 ml  Output 1320 ml  Net -845 ml   Filed Weights   12/12/18 0500 12/13/18 0345 12/14/18 0455  Weight: 82.8 kg 81.4 kg 79.8 kg    Examination:  General exam: Appears calm and comfortable  Respiratory system: Clear to auscultation. Respiratory effort normal. Cardiovascular system: S1 & S2 heard, irregularly regular rate and rhythm. No JVD, murmurs, rubs, gallops or clicks. No pedal edema. Gastrointestinal system: Abdomen is nondistended, soft and slightly tender in the right upper quadrant.  No organomegaly or masses felt. Normal bowel sounds heard.  Percutaneous cholecystostomy drain noted. Central nervous system: Alert and oriented.  Left hemiparesis. Skin: No rashes, lesions or ulcers.  Psychiatry: Judgement and insight appear poor, mood & affect flat.  Procedures:  10/24-percutaneous cholecystostomy  Microbiology summarized: MRSA PCR negative Exploited sputum not acceptable for culture Blood culture with coag negative staph in 1 out of 2 bottles Previous blood cultures negative. Biliary culture pending.  Sch  Meds:  Scheduled Meds: . chlorhexidine  15 mL Mouth Rinse BID  . Chlorhexidine Gluconate Cloth  6 each Topical Daily  . diclofenac sodium  2 g Topical QID  . digoxin  0.3125 mg Oral Daily  . feeding supplement (ENSURE ENLIVE)  237 mL Oral TID BM  . furosemide  40 mg Oral Daily  . gabapentin  300 mg Oral Q8H  . mouth rinse  15 mL Mouth Rinse q12n4p  . metoprolol tartrate  25 mg Oral Q6H  . multivitamin with minerals  1 tablet Oral Daily  . pantoprazole  40 mg Oral Daily  . saccharomyces boulardii  250 mg Oral BID  . sodium chloride flush  5 mL Intracatheter Q8H   Continuous Infusions: . sodium chloride Stopped (12/10/18 1036)  . diltiazem (CARDIZEM) infusion 5 mg/hr (12/12/18 1633)  . heparin 2,400 Units/hr (12/14/18 0040)  . piperacillin-tazobactam (ZOSYN)  IV 3.375 g (12/14/18 0851)   PRN Meds:.sodium chloride, acetaminophen, bisacodyl, calcium carbonate, hydrALAZINE, ipratropium, levalbuterol, metoprolol tartrate  Antimicrobials: Anti-infectives (From admission, onward)   Start     Dose/Rate Route Frequency Ordered Stop   12/07/18 0800  vancomycin (VANCOCIN) 1,500 mg in sodium chloride 0.9 % 500 mL IVPB  Status:  Discontinued     1,500 mg 250 mL/hr over 120 Minutes Intravenous Every 24 hours 12/06/18 1206 12/07/18 0931   12/06/18 0330  vancomycin (VANCOCIN) 1,500 mg in sodium chloride 0.9 % 500 mL IVPB  1,500 mg 250 mL/hr over 120 Minutes Intravenous  Once 12/06/18 0323 12/06/18 0557   12/03/18 2200  vancomycin (VANCOCIN) 1,500 mg in sodium chloride 0.9 % 500 mL IVPB  Status:  Discontinued     1,500 mg 250 mL/hr over 120 Minutes Intravenous Every 24 hours 12/02/18 2115 12/03/18 0850   12/03/18 2100  vancomycin (VANCOCIN) 1,250 mg in sodium chloride 0.9 % 250 mL IVPB  Status:  Discontinued     1,250 mg 166.7 mL/hr over 90 Minutes Intravenous Every 24 hours 12/03/18 0850 12/03/18 1009   12/02/18 2100  piperacillin-tazobactam (ZOSYN) IVPB 3.375 g     3.375 g 12.5 mL/hr over  240 Minutes Intravenous Every 8 hours 12/02/18 2040     12/02/18 2045  vancomycin (VANCOCIN) 1,750 mg in sodium chloride 0.9 % 500 mL IVPB     1,750 mg 250 mL/hr over 120 Minutes Intravenous  Once 12/02/18 2040 12/03/18 0100       I have personally reviewed the following labs and images: CBC: Recent Labs  Lab 12/10/18 1731 12/11/18 0345 12/11/18 1512 12/12/18 0348 12/13/18 0312 12/14/18 0413  WBC 14.7* 10.1 10.0 8.7 8.1 9.2  NEUTROABS 10.3*  --   --   --   --   --   HGB 10.8* 9.0* 9.0* 8.9* 9.7* 9.9*  HCT 32.3* 27.7* 27.9* 27.4* 29.9* 30.7*  MCV 94.7 94.9 95.9 95.1 94.0 94.5  PLT 337 363 363 359 371 442*   BMP &GFR Recent Labs  Lab 12/09/18 0324 12/10/18 0739 12/11/18 0345 12/12/18 0348 12/12/18 1918 12/13/18 0312 12/14/18 0413  NA 135 136 135 135  --  133* 135  K 4.3 3.7 3.9 4.0 4.3 4.9 4.1  CL 95* 95* 99 99  --  100 102  CO2 24 27 26 25   --  23 22  GLUCOSE 114* 115* 108* 95  --  95 100*  BUN 35* 39* 30* 24*  --  16 13  CREATININE 1.45* 1.36* 1.12 1.15  --  1.02 0.95  CALCIUM 8.5* 8.7* 8.3* 8.6*  --  8.5* 8.8*  MG 2.2 2.6* 2.3 2.4 2.3 2.3 2.1  PHOS 3.9 3.7  --   --   --   --  3.4   Estimated Creatinine Clearance: 77.5 mL/min (by C-G formula based on SCr of 0.95 mg/dL). Liver & Pancreas: Recent Labs  Lab 12/11/18 0345 12/12/18 0348  AST 32 24  ALT 29 24  ALKPHOS 102 105  BILITOT 0.9 0.7  PROT 6.7 7.3  ALBUMIN 2.0* 2.2*   No results for input(s): LIPASE, AMYLASE in the last 168 hours. No results for input(s): AMMONIA in the last 168 hours. Diabetic: No results for input(s): HGBA1C in the last 72 hours. Recent Labs  Lab 12/09/18 2355 12/10/18 0347 12/10/18 0732 12/10/18 1119 12/10/18 1514  GLUCAP 128* 107* 109* 118* 108*   Cardiac Enzymes: No results for input(s): CKTOTAL, CKMB, CKMBINDEX, TROPONINI in the last 168 hours. No results for input(s): PROBNP in the last 8760 hours. Coagulation Profile: No results for input(s): INR, PROTIME in the  last 168 hours. Thyroid Function Tests: No results for input(s): TSH, T4TOTAL, FREET4, T3FREE, THYROIDAB in the last 72 hours. Lipid Profile: No results for input(s): CHOL, HDL, LDLCALC, TRIG, CHOLHDL, LDLDIRECT in the last 72 hours. Anemia Panel: No results for input(s): VITAMINB12, FOLATE, FERRITIN, TIBC, IRON, RETICCTPCT in the last 72 hours. Urine analysis:    Component Value Date/Time   COLORURINE AMBER (A) 12/05/2018 0227   APPEARANCEUR HAZY (  A) 12/05/2018 0227   LABSPEC 1.040 (H) 12/05/2018 0227   PHURINE 5.0 12/05/2018 0227   GLUCOSEU NEGATIVE 12/05/2018 0227   HGBUR SMALL (A) 12/05/2018 0227   BILIRUBINUR NEGATIVE 12/05/2018 0227   KETONESUR NEGATIVE 12/05/2018 0227   PROTEINUR 100 (A) 12/05/2018 0227   NITRITE NEGATIVE 12/05/2018 0227   LEUKOCYTESUR NEGATIVE 12/05/2018 0227   Sepsis Labs: Invalid input(s): PROCALCITONIN, [place]  Microbiology: Recent Results (from the past 240 hour(s))  Culture, blood (routine x 2)     Status: None   Collection Time: 12/05/18  4:06 AM   Specimen: BLOOD LEFT HAND  Result Value Ref Range Status   Specimen Description BLOOD LEFT HAND  Final   Special Requests   Final    BOTTLES DRAWN AEROBIC ONLY Blood Culture adequate volume   Culture   Final    NO GROWTH 5 DAYS Performed at [facility], [address], [place], [place] [address]    Report Status 12/10/2018 FINAL  Final  Culture, blood (routine x 2)     Status: Abnormal   Collection Time: 12/05/18  4:06 AM   Specimen: BLOOD RIGHT HAND  Result Value Ref Range Status   Specimen Description BLOOD RIGHT HAND  Final   Special Requests   Final    BOTTLES DRAWN AEROBIC ONLY Blood Culture results may not be optimal due to an inadequate volume of blood received in culture bottles   Culture  Setup Time   Final    AEROBIC BOTTLE ONLY GRAM POSITIVE COCCI CRITICAL RESULT CALLED TO, READ BACK BY AND VERIFIED WITH: V BRYK PHARMD 12/06/18 0315 JDW    Culture (A)  Final     STAPHYLOCOCCUS SPECIES (COAGULASE NEGATIVE) THE SIGNIFICANCE OF ISOLATING THIS ORGANISM FROM A SINGLE SET OF BLOOD CULTURES WHEN MULTIPLE SETS ARE DRAWN IS UNCERTAIN. PLEASE NOTIFY THE MICROBIOLOGY DEPARTMENT WITHIN ONE WEEK IF SPECIATION AND SENSITIVITIES ARE REQUIRED. Performed at [facility], [place] [address], [place], [place] [address]    Report Status 12/07/2018 FINAL  Final  Expectorated sputum assessment w rflx to resp cult     Status: None   Collection Time: 12/06/18  9:06 PM   Specimen: SPU  Result Value Ref Range Status   Specimen Description SPUTUM  Final   Special Requests NONE  Final   Sputum evaluation   Final    Sputum specimen not acceptable for testing.  Please recollect.   RESULT CALLED TO, READ BACK BY AND VERIFIED WITH: RN York Cerise CRUZ [id number] FCP Performed at [facility] [address], [place], [place] [address]    Report Status 12/07/2018 FINAL  Final  Body fluid culture     Status: None (Preliminary result)   Collection Time: 12/13/18  1:29 PM   Specimen: BILE; Body Fluid  Result Value Ref Range Status   Specimen Description BILE  Final   Special Requests Normal  Final   Gram Stain   Final    RARE WBC PRESENT,BOTH PMN AND MONONUCLEAR NO ORGANISMS SEEN    Culture   Final    NO GROWTH < 24 HOURS Performed at [facility], [place] [address], [place], [place] [address]    Report Status PENDING  Incomplete    Radiology Studies: Ir Perc Cholecystostomy  Result Date: 12/13/2018 INDICATION: 68 year old male with acute calculus cholecystitis. He is currently not an operative candidate and therefore presents for percutaneous cholecystostomy tube placement. EXAM: CHOLECYSTOSTOMY MEDICATIONS: Patient is currently receiving intravenous antibiotics on a  schedule. No additional antibiotic prophylaxis was administered. ANESTHESIA/SEDATION: Moderate (conscious) sedation was employed during this procedure. A total of Versed 1.5 mg and Fentanyl 75 mcg was  administered intravenously. Moderate Sedation Time: 10 minutes. The patient's level of consciousness and vital signs were monitored continuously by radiology nursing throughout the procedure under my direct supervision. FLUOROSCOPY TIME:  Fluoroscopy Time: 0 minutes 48 seconds (5 mGy). COMPLICATIONS: None immediate. PROCEDURE: Informed written consent was obtained from the patient after a thorough discussion of the procedural risks, benefits and alternatives. All questions were addressed. Maximal Sterile Barrier Technique was utilized including caps, mask, sterile gowns, sterile gloves, sterile drape, hand hygiene and skin antiseptic. A timeout was performed prior to the initiation of the procedure. The right upper quadrant was interrogated with ultrasound. The gallbladder is filled with sludge, debris and small stones. The wall is thickened and striated. A suitable skin entry site was selected and marked. The overlying skin was sterilely prepped and draped in the standard fashion using chlorhexidine skin prep. Local anesthesia was attained by infiltration with 1% lidocaine. A small dermatotomy was made. Under real-time sonographic guidance, a 21 gauge Accustick needle was advanced along a short transhepatic course and into the gallbladder lumen. A wire was advanced in the gallbladder lumen. The needle was exchanged for the Accustick sheath which was advanced over the wire and into the gallbladder lumen. A gentle hand injection of contrast material confirmed that the sheath was indeed within the gallbladder lumen. A short Amplatz wire was then advanced and coiled in the gallbladder lumen. The Accustick sheath was removed. The skin tract was dilated to 10 [place]. A Cook 10.2 [place] all-purpose drainage catheter was advanced over the wire and formed. Aspiration yields thick black bile. A sample was sent for Gram stain and culture. The catheter was gently flushed with saline and connected to gravity bag drainage. The  catheter was secured to the skin with 0 Prolene suture. The patient tolerated the procedure well. IMPRESSION: Successful placement of a percutaneous transhepatic cholecystostomy tube for acute calculus cholecystitis in a poor operative candidate. PLAN: 1. Maintain tube to gravity bag drainage. 2. Follow-up with Interventional Radiology in 4 weeks for through the tube cholangiogram to assess patency of the cystic and common bile ducts. Electronically Signed   By: Jacqulynn Cadet M.D.   On: 12/13/2018 16:08   Ishmael Holter, MD [facility]  If 7PM-7AM, please contact night-coverage [email or web address] Password [email or web address] 12/14/2018, 10:03 AM

## 2018-12-15 DIAGNOSIS — I82453 Acute embolism and thrombosis of peroneal vein, bilateral: Secondary | ICD-10-CM

## 2018-12-15 LAB — DIGOXIN LEVEL: Digoxin Level: 0.9 ng/mL (ref 0.8–2.0)

## 2018-12-15 LAB — HEPARIN LEVEL (UNFRACTIONATED): Heparin Unfractionated: 0.44 IU/mL (ref 0.30–0.70)

## 2018-12-15 MED ORDER — ATORVASTATIN CALCIUM 10 MG PO TABS
20.0000 mg | ORAL_TABLET | Freq: Every day | ORAL | Status: DC
  Administered 2018-12-15 – 2018-12-23 (×9): 20 mg via ORAL
  Filled 2018-12-15 (×9): qty 2

## 2018-12-15 NOTE — Progress Notes (Addendum)
Physical Therapy Treatment Patient Details Name: Glenn [name] MRN: [id number] DOB: [birth year] Today's Date: 12/15/2018    History of Present Illness Pt is a 68 y/o male admitted from CIR secondary to a fib with RVR. Also found to have DVT on LLE and is s/p IVC filter placement. Pt also developed acute cholecystitis and had drain placed in IR on 10/25. 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 PMH includes a fib, CHF, hep C, HTN, and s/p ICD.     PT Comments    Pt continues to require significant assist for all mobility. HR incr to 140's-170 with activity. Continue to recommend SNF.    Follow Up Recommendations  SNF;Supervision/Assistance - 24 hour     Equipment Recommendations  Other (comment)(TBD)    Recommendations for Other Services       Precautions / Restrictions Precautions Precautions: Fall Precaution Comments: L hemi Restrictions Weight Bearing Restrictions: No    Mobility  Bed Mobility Overal bed mobility: Needs Assistance Bed Mobility: Supine to Sit     Supine to sit: Mod assist;HOB elevated     General bed mobility comments: Assist to bring LLE off of bed, elevate trunk into sitting and bring hips to EOB.  Transfers Overall transfer level: Needs assistance Equipment used: Ambulation equipment used Transfers: Sit to/from Stand Sit to Stand: Mod assist;+2 physical assistance         General transfer comment: assist to bring hips up and for balance. Pt stood x 4 with Stedy. Difficulty extending hips and trunk. Used Stedy to transfer bed to chair.   Ambulation/Gait                 Stairs             Wheelchair Mobility    Modified Rankin (Stroke Patients Only) Modified Rankin (Stroke Patients Only) Pre-Morbid Rankin Score: No symptoms Modified Rankin: Severe disability     Balance Overall balance assessment: Needs assistance Sitting-balance support: Feet supported;Single extremity supported Sitting balance-Leahy  Scale: Poor Sitting balance - Comments: UE support to maintain sitting   Standing balance support: During functional activity;Single extremity supported Standing balance-Leahy Scale: Poor Standing balance comment: With stedy pt stood x 4 for 10 - 45 sec with mod assist to maintain. Pt with lateral trunk lean to lt with trunk and hip collapsing to the left.                             Cognition Arousal/Alertness: Awake/alert Behavior During Therapy: WFL for tasks assessed/performed Overall Cognitive Status: No family/caregiver present to determine baseline cognitive functioning Area of Impairment: Attention;Memory;Following commands;Safety/judgement;Problem solving                   Current Attention Level: Sustained Memory: Decreased recall of precautions;Decreased short-term memory Following Commands: Follows one step commands consistently Safety/Judgement: Decreased awareness of safety;Decreased awareness of deficits   Problem Solving: Difficulty sequencing;Requires verbal cues;Requires tactile cues General Comments: Pt with recent CVA.      Exercises      General Comments General comments (skin integrity, edema, etc.): HR to 140's -170 with standing and transfer. Back down to 110's after sitting in chair      Pertinent Vitals/Pain Pain Assessment: Faces Faces Pain Scale: Hurts even more Pain Location: LLE Pain Descriptors / Indicators: Aching;Burning Pain Intervention(s): Limited activity within patient's tolerance;Monitored during session;Repositioned    Home Living  Prior Function            PT Goals (current goals can now be found in the care plan section) Progress towards PT goals: Progressing toward goals    Frequency    Min 2X/week      PT Plan Current plan remains appropriate    Co-evaluation              AM-PAC PT "6 Clicks" Mobility   Outcome Measure  Help needed turning from your back to  your side while in a flat bed without using bedrails?: A Lot Help needed moving from lying on your back to sitting on the side of a flat bed without using bedrails?: A Lot Help needed moving to and from a bed to a chair (including a wheelchair)?: Total Help needed standing up from a chair using your arms (e.g., wheelchair or bedside chair)?: Total Help needed to walk in hospital room?: Total Help needed climbing 3-5 steps with a railing? : Total 6 Click Score: 8    End of Session   Activity Tolerance: Patient limited by fatigue;Treatment limited secondary to medical complications (Comment)(high HR) Patient left: in chair;with call bell/phone within reach;with chair alarm set Nurse Communication: Mobility status;Need for lift equipment PT Visit Diagnosis: Other abnormalities of gait and mobility (R26.89);Hemiplegia and hemiparesis;Difficulty in walking, not elsewhere classified (R26.2) Hemiplegia - Right/Left: Left Hemiplegia - dominant/non-dominant: Non-dominant Hemiplegia - caused by: Cerebral infarction;Nontraumatic intracerebral hemorrhage     Time: 1040-1100 PT Time Calculation (min) (ACUTE ONLY): 20 min  Charges:  $Therapeutic Activity: 8-22 mins                     [place] Pager [phone] Office [place] 12/15/2018, 1:20 PM

## 2018-12-15 NOTE — Progress Notes (Signed)
PROGRESS NOTE  Glenn [name] [id number] DOB: [birth year]   PCP: [name], MD  Patient is from: [facility]. lives with friends before hospitalization.  DOA: 12/02/2018 LOS: 63  Brief Narrative / Interim history: 68 year old male with history of HTN, HLD, A. fib previously on Eliquis, EF 15-20% s/p ICD and cardioversion 10/29/2018, Hep C, CKD-III, recently hospitalized 9/21-10/13 for right MCA subacute hemorrhagic CVA and discharged to CIR. He returns the same date with A. fib with RVR and abdominal pain. Admitted to ICU for A. fib with RVR, sepsis likely due to aspiration pneumonia, AoC CHF, extensive DVT with IVC placement.  He is nowon room air. Still full code.  Cardiology following.  TRH reassumed care on 12/10/2018.  Repeat CT chest without contrast on 10/21 with progressive RLL pneumonia and possible cholecystitis.  RUQ [place] and HIDA positive for acute cholecystitis.  General surgery and IR following.  Plan for percutaneous biliary drainage and fluid culture  Assessment & Plan: Sepsis due to RLL pneumonia/aspiration pneumonia and acute cholecystitis: last fever 101.6 on 10/19.  Leukocytosis resolved.  Right upper quadrant ultrasound and HIDA positive for acute cholecystitis. -Completed ceftriaxone course on 10/8 previous admission -Vancomycin 10/13-10/18 -Started on Zosyn 10/13 for pneumonia, now with cholecystitis. -Previous hospitalist discussed with ID, Dr. Graylon Good over the phone who recommended continuing Zosyn pending biliary culture.  Seen by general surgery.  Per them, not a surgical candidate.  Status post percutaneous cholecystostomy drain placed by IR on 12/13/2018.  IR and general surgery following.  He is doing much better.  No abdominal pain or any other complaint.  A. fib with RVR: Rates around 90s. -Cardiology managing.  Now off of Cardizem drip.  Digoxin reduced to dose.  Continues to be on metoprolol an heparin drip. cardiology following management per them.   Appreciate their help.  Will need to be switched to Eliquis once cleared by neurosurgery.  No note from neurosurgery for last few days.  Neurosurgery will need to be called back once close to discharge.  Acute on chronic systolic CHF: Echo [id number] with LVEF 15-20%.  ICD status. -On p.o. Lasix per Cardiology -Entresto on hold so far while he was n.p.o. yesterday.  Now on diet.  Will defer to cardiology about the decision.  Extensive DVT s/p IVC filter -Continue heparin -Eventually transition to Eliquis. -IR to reassess for filter retrieval in about 8 to 12 weeks.  Acute respiratory failure with hypoxia: Multifactorial-pneumonia, A. fib with RVR and CHF. On room air now. -Continue bronchodilators-Xopenex and Atrovent -Antibiotics as above -BiPAP at night  Right pleural effusion: Noted on CXR and CT abdomen and pelvis.  Reported as small on repeat CT 10/21.  -Treat underlying pneumonia  History of MCA hemorrhagic stroke with residual left hemiparesis/dysphagia: Hospitalized for this 9/21-10/13 and discharged to CIR. Returns with A. fib with RVR and sepsis as above -CT head 10/18-expected evolution of BG hemorrhage and prominent anterior right MCA infarct.  Resolved lesion of IVH -Closely monitor while on heparin -PT/OT.  Normocytic anemia: Likely due to acute on chronic illness and blood loss.  No obvious bleeding now.  No hematochezia or melena per RN.  H&H stable. -Continue monitoring  History of hepatitis C -Outpatient follow-up  AKI: previously thought to have CKD-3.  Now creatinine and GFR within normal. -Continue monitoring   Dysphagia -Dysphagia 1 diet and MBS per SLP recommendation.  Malnutrition Type/Characteristics and interventions: Nutrition Problem: Inadequate oral intake Etiology: inability to eat  Signs/Symptoms: NPO status  Interventions: Ensure Enlive (each  supplement provides 350kcal and 20 grams of protein), MVI, Magic cup, Hormel Shake   Goal of care  discussion:  Following discussion took place between previous hospitalist and son.    "10/22-discussed about the CODE STATUS.  Patient defers decision to his son, [name].  Reached out to [name] over the phone and discussed the risk and benefits of CPR/intubation. [name] says, "do what it takes to keep my father alive". "  DVT prophylaxis: On heparin drip Code Status: Full code Family Communication: Plan of care discussed with patient.  We will try to call son later today. Disposition Plan: Remains inpatient. Consultants: PCCM, cardiology, ID (over the phone), GS and IR   Subjective: Patient seen and examined.  Feels much better.  No complaint.  No abdominal pain.  He is asking if he can eat fried chicken.  Objective: Vitals:   12/15/18 0020 12/15/18 0433 12/15/18 0630 12/15/18 1008  BP:  (!) 112/53  136/83  Pulse: 97 66 79 66  Resp:  (!) 24  18  Temp:  98.7 F (37.1 C)    TempSrc:  Oral    SpO2:  100%  100%  Weight:  80.3 kg    Height:        Intake/Output Summary (Last 24 hours) at 12/15/2018 1024 Last data filed at 12/15/2018 0650 Gross per 24 hour  Intake 240 ml  Output 15 ml  Net 225 ml   Filed Weights   12/13/18 0345 12/14/18 0455 12/15/18 0433  Weight: 81.4 kg 79.8 kg 80.3 kg    Examination:  General exam: Appears calm and comfortable  Respiratory system: Clear to auscultation. Respiratory effort normal. Cardiovascular system: S1 & S2 heard, RRR. No JVD, murmurs, rubs, gallops or clicks. No pedal edema. Gastrointestinal system: Abdomen is nondistended, soft and mildly tender at right upper quadrant. No organomegaly or masses felt. Normal bowel sounds heard.  Percutaneous cholecystostomy drain in place. Central nervous system: Alert and oriented. No focal neurological deficits. Extremities: Symmetric 5 x 5 power. Skin: No rashes, lesions or ulcers.  Psychiatry: Judgement and insight appear poor. Mood & affect flat.   Procedures:   10/24-percutaneous cholecystostomy  Microbiology summarized: MRSA PCR negative Exploited sputum not acceptable for culture Blood culture with coag negative staph in 1 out of 2 bottles Previous blood cultures negative. Biliary culture pending.  Sch Meds:  Scheduled Meds: . chlorhexidine  15 mL Mouth Rinse BID  . Chlorhexidine Gluconate Cloth  6 each Topical Daily  . diclofenac sodium  2 g Topical QID  . digoxin  0.25 mg Oral Daily  . feeding supplement (ENSURE ENLIVE)  237 mL Oral TID BM  . furosemide  40 mg Oral Daily  . gabapentin  300 mg Oral Q8H  . mouth rinse  15 mL Mouth Rinse q12n4p  . metoprolol tartrate  25 mg Oral Q6H  . multivitamin with minerals  1 tablet Oral Daily  . pantoprazole  40 mg Oral Daily  . saccharomyces boulardii  250 mg Oral BID  . sacubitril-valsartan  1 tablet Oral BID  . sodium chloride flush  5 mL Intracatheter Q8H   Continuous Infusions: . sodium chloride Stopped (12/10/18 1036)  . heparin 2,400 Units/hr (12/15/18 0019)  . piperacillin-tazobactam (ZOSYN)  IV 3.375 g (12/15/18 1009)   PRN Meds:.sodium chloride, acetaminophen, bisacodyl, calcium carbonate, ipratropium, levalbuterol, metoprolol tartrate  Antimicrobials: Anti-infectives (From admission, onward)   Start     Dose/Rate Route Frequency Ordered Stop   12/07/18 0800  vancomycin (VANCOCIN) 1,500  mg in sodium chloride 0.9 % 500 mL IVPB  Status:  Discontinued     1,500 mg 250 mL/hr over 120 Minutes Intravenous Every 24 hours 12/06/18 1206 12/07/18 0931   12/06/18 0330  vancomycin (VANCOCIN) 1,500 mg in sodium chloride 0.9 % 500 mL IVPB     1,500 mg 250 mL/hr over 120 Minutes Intravenous  Once 12/06/18 0323 12/06/18 0557   12/03/18 2200  vancomycin (VANCOCIN) 1,500 mg in sodium chloride 0.9 % 500 mL IVPB  Status:  Discontinued     1,500 mg 250 mL/hr over 120 Minutes Intravenous Every 24 hours 12/02/18 2115 12/03/18 0850   12/03/18 2100  vancomycin (VANCOCIN) 1,250 mg in sodium chloride  0.9 % 250 mL IVPB  Status:  Discontinued     1,250 mg 166.7 mL/hr over 90 Minutes Intravenous Every 24 hours 12/03/18 0850 12/03/18 1009   12/02/18 2100  piperacillin-tazobactam (ZOSYN) IVPB 3.375 g     3.375 g 12.5 mL/hr over 240 Minutes Intravenous Every 8 hours 12/02/18 2040     12/02/18 2045  vancomycin (VANCOCIN) 1,750 mg in sodium chloride 0.9 % 500 mL IVPB     1,750 mg 250 mL/hr over 120 Minutes Intravenous  Once 12/02/18 2040 12/03/18 0100       I have personally reviewed the following labs and images: CBC: Recent Labs  Lab 12/10/18 1731 12/11/18 0345 12/11/18 1512 12/12/18 0348 12/13/18 0312 12/14/18 0413  WBC 14.7* 10.1 10.0 8.7 8.1 9.2  NEUTROABS 10.3*  --   --   --   --   --   HGB 10.8* 9.0* 9.0* 8.9* 9.7* 9.9*  HCT 32.3* 27.7* 27.9* 27.4* 29.9* 30.7*  MCV 94.7 94.9 95.9 95.1 94.0 94.5  PLT 337 363 363 359 371 442*   BMP &GFR Recent Labs  Lab 12/09/18 0324 12/10/18 0739 12/11/18 0345 12/12/18 0348 12/12/18 1918 12/13/18 0312 12/14/18 0413  NA 135 136 135 135  --  133* 135  K 4.3 3.7 3.9 4.0 4.3 4.9 4.1  CL 95* 95* 99 99  --  100 102  CO2 24 27 26 25   --  23 22  GLUCOSE 114* 115* 108* 95  --  95 100*  BUN 35* 39* 30* 24*  --  16 13  CREATININE 1.45* 1.36* 1.12 1.15  --  1.02 0.95  CALCIUM 8.5* 8.7* 8.3* 8.6*  --  8.5* 8.8*  MG 2.2 2.6* 2.3 2.4 2.3 2.3 2.1  PHOS 3.9 3.7  --   --   --   --  3.4   Estimated Creatinine Clearance: 77.7 mL/min (by C-G formula based on SCr of 0.95 mg/dL). Liver & Pancreas: Recent Labs  Lab 12/11/18 0345 12/12/18 0348  AST 32 24  ALT 29 24  ALKPHOS 102 105  BILITOT 0.9 0.7  PROT 6.7 7.3  ALBUMIN 2.0* 2.2*   No results for input(s): LIPASE, AMYLASE in the last 168 hours. No results for input(s): AMMONIA in the last 168 hours. Diabetic: No results for input(s): HGBA1C in the last 72 hours. Recent Labs  Lab 12/09/18 2355 12/10/18 0347 12/10/18 0732 12/10/18 1119 12/10/18 1514  GLUCAP 128* 107* 109* 118* 108*    Cardiac Enzymes: No results for input(s): CKTOTAL, CKMB, CKMBINDEX, TROPONINI in the last 168 hours. No results for input(s): PROBNP in the last 8760 hours. Coagulation Profile: No results for input(s): INR, PROTIME in the last 168 hours. Thyroid Function Tests: No results for input(s): TSH, T4TOTAL, FREET4, T3FREE, THYROIDAB in the last 72 hours.  Lipid Profile: No results for input(s): CHOL, HDL, LDLCALC, TRIG, CHOLHDL, LDLDIRECT in the last 72 hours. Anemia Panel: No results for input(s): VITAMINB12, FOLATE, FERRITIN, TIBC, IRON, RETICCTPCT in the last 72 hours. Urine analysis:    Component Value Date/Time   COLORURINE AMBER (A) 12/05/2018 0227   APPEARANCEUR HAZY (A) 12/05/2018 0227   LABSPEC 1.040 (H) 12/05/2018 0227   PHURINE 5.0 12/05/2018 0227   GLUCOSEU NEGATIVE 12/05/2018 0227   HGBUR SMALL (A) 12/05/2018 0227   BILIRUBINUR NEGATIVE 12/05/2018 0227   KETONESUR NEGATIVE 12/05/2018 0227   PROTEINUR 100 (A) 12/05/2018 0227   NITRITE NEGATIVE 12/05/2018 0227   LEUKOCYTESUR NEGATIVE 12/05/2018 0227   Sepsis Labs: Invalid input(s): PROCALCITONIN, [place]  Microbiology: Recent Results (from the past 240 hour(s))  Expectorated sputum assessment w rflx to resp cult     Status: None   Collection Time: 12/06/18  9:06 PM   Specimen: SPU  Result Value Ref Range Status   Specimen Description SPUTUM  Final   Special Requests NONE  Final   Sputum evaluation   Final    Sputum specimen not acceptable for testing.  Please recollect.   RESULT CALLED TO, READ BACK BY AND VERIFIED WITH: RN York Cerise CRUZ [id number] FCP Performed at [facility] [address] [place], [place] [address]    Report Status 12/07/2018 FINAL  Final  Body fluid culture     Status: None (Preliminary result)   Collection Time: 12/13/18  1:29 PM   Specimen: BILE; Body Fluid  Result Value Ref Range Status   Specimen Description BILE  Final   Special Requests Normal  Final   Gram Stain   Final     RARE WBC PRESENT,BOTH PMN AND MONONUCLEAR NO ORGANISMS SEEN    Culture   Final    NO GROWTH 2 DAYS Performed at [facility], [place] [address] [place], [place] [address]    Report Status PENDING  Incomplete    Radiology Studies: No results found.   Ishmael Holter, MD [facility]  If 7PM-7AM, please contact night-coverage [email or web address] Password [email or web address] 12/15/2018, 10:24 AM

## 2018-12-15 NOTE — Progress Notes (Signed)
Patient ID: Glenn [name], male   DOB: [birth year], 68 y.o.   MRN: [id number]       Subjective: On D I diet and tolerating well.  No nausea.  Abdominal pain improved since drain placement.   Objective: Vital signs in last 24 hours: Temp:  [98.1 F (36.7 C)-99.6 F (37.6 C)] 98.7 F (37.1 C) (10/26 0433) Pulse Rate:  [66-97] 79 (10/26 0630) Resp:  [18-27] 24 (10/26 0433) BP: (112-145)/(53-86) 112/53 (10/26 0433) SpO2:  [98 %-100 %] 100 % (10/26 0433) Weight:  [80.3 kg] 80.3 kg (10/26 0433) Last BM Date: 12/14/18  Intake/Output from previous day: 10/25 0701 - 10/26 0700 In: 240 [P.O.:240] Out: 15 [Drains:15] Intake/Output this shift: No intake/output data recorded.  PE: Abd: soft, NT, drain in place with bilious output, +BS, ND  Lab Results:  Recent Labs    12/13/18 0312 12/14/18 0413  WBC 8.1 9.2  HGB 9.7* 9.9*  HCT 29.9* 30.7*  PLT 371 442*   BMET Recent Labs    12/13/18 0312 12/14/18 0413  NA 133* 135  K 4.9 4.1  CL 100 102  CO2 23 22  GLUCOSE 95 100*  BUN 16 13  CREATININE 1.02 0.95  CALCIUM 8.5* 8.8*   PT/INR No results for input(s): LABPROT, INR in the last 72 hours. CMP     Component Value Date/Time   NA 135 12/14/2018 0413   NA 142 04/22/2017 1123   K 4.1 12/14/2018 0413   CL 102 12/14/2018 0413   CO2 22 12/14/2018 0413   GLUCOSE 100 (H) 12/14/2018 0413   BUN 13 12/14/2018 0413   BUN 8 04/22/2017 1123   CREATININE 0.95 12/14/2018 0413   CREATININE 1.40 (H) 12/03/2017 1407   CALCIUM 8.8 (L) 12/14/2018 0413   PROT 7.3 12/12/2018 0348   ALBUMIN 2.2 (L) 12/12/2018 0348   AST 24 12/12/2018 0348   ALT 24 12/12/2018 0348   ALT 45 05/21/2017 1520   ALKPHOS 105 12/12/2018 0348   BILITOT 0.7 12/12/2018 0348   GFRNONAA >60 12/14/2018 0413   GFRNONAA 69 05/21/2017 1510   GFRAA >60 12/14/2018 0413   GFRAA 80 05/21/2017 1510   Lipase  No results found for: LIPASE     Studies/Results: Ir Perc Cholecystostomy  Result Date:  12/13/2018 INDICATION: 68 year old male with acute calculus cholecystitis. He is currently not an operative candidate and therefore presents for percutaneous cholecystostomy tube placement. EXAM: CHOLECYSTOSTOMY MEDICATIONS: Patient is currently receiving intravenous antibiotics on a schedule. No additional antibiotic prophylaxis was administered. ANESTHESIA/SEDATION: Moderate (conscious) sedation was employed during this procedure. A total of Versed 1.5 mg and Fentanyl 75 mcg was administered intravenously. Moderate Sedation Time: 10 minutes. The patient's level of consciousness and vital signs were monitored continuously by radiology nursing throughout the procedure under my direct supervision. FLUOROSCOPY TIME:  Fluoroscopy Time: 0 minutes 48 seconds (5 mGy). COMPLICATIONS: None immediate. PROCEDURE: Informed written consent was obtained from the patient after a thorough discussion of the procedural risks, benefits and alternatives. All questions were addressed. Maximal Sterile Barrier Technique was utilized including caps, mask, sterile gowns, sterile gloves, sterile drape, hand hygiene and skin antiseptic. A timeout was performed prior to the initiation of the procedure. The right upper quadrant was interrogated with ultrasound. The gallbladder is filled with sludge, debris and small stones. The wall is thickened and striated. A suitable skin entry site was selected and marked. The overlying skin was sterilely prepped and draped in the standard fashion using chlorhexidine skin prep. Local anesthesia  was attained by infiltration with 1% lidocaine. A small dermatotomy was made. Under real-time sonographic guidance, a 21 gauge Accustick needle was advanced along a short transhepatic course and into the gallbladder lumen. A wire was advanced in the gallbladder lumen. The needle was exchanged for the Accustick sheath which was advanced over the wire and into the gallbladder lumen. A gentle hand injection of  contrast material confirmed that the sheath was indeed within the gallbladder lumen. A short Amplatz wire was then advanced and coiled in the gallbladder lumen. The Accustick sheath was removed. The skin tract was dilated to 10 [place]. A Cook 10.2 [place] all-purpose drainage catheter was advanced over the wire and formed. Aspiration yields thick black bile. A sample was sent for Gram stain and culture. The catheter was gently flushed with saline and connected to gravity bag drainage. The catheter was secured to the skin with 0 Prolene suture. The patient tolerated the procedure well. IMPRESSION: Successful placement of a percutaneous transhepatic cholecystostomy tube for acute calculus cholecystitis in a poor operative candidate. PLAN: 1. Maintain tube to gravity bag drainage. 2. Follow-up with Interventional Radiology in 4 weeks for through the tube cholangiogram to assess patency of the cystic and common bile ducts. Electronically Signed   By: Jacqulynn Cadet M.D.   On: 12/13/2018 16:08    Anti-infectives: Anti-infectives (From admission, onward)   Start     Dose/Rate Route Frequency Ordered Stop   12/07/18 0800  vancomycin (VANCOCIN) 1,500 mg in sodium chloride 0.9 % 500 mL IVPB  Status:  Discontinued     1,500 mg 250 mL/hr over 120 Minutes Intravenous Every 24 hours 12/06/18 1206 12/07/18 0931   12/06/18 0330  vancomycin (VANCOCIN) 1,500 mg in sodium chloride 0.9 % 500 mL IVPB     1,500 mg 250 mL/hr over 120 Minutes Intravenous  Once 12/06/18 0323 12/06/18 0557   12/03/18 2200  vancomycin (VANCOCIN) 1,500 mg in sodium chloride 0.9 % 500 mL IVPB  Status:  Discontinued     1,500 mg 250 mL/hr over 120 Minutes Intravenous Every 24 hours 12/02/18 2115 12/03/18 0850   12/03/18 2100  vancomycin (VANCOCIN) 1,250 mg in sodium chloride 0.9 % 250 mL IVPB  Status:  Discontinued     1,250 mg 166.7 mL/hr over 90 Minutes Intravenous Every 24 hours 12/03/18 0850 12/03/18 1009   12/02/18 2100   piperacillin-tazobactam (ZOSYN) IVPB 3.375 g     3.375 g 12.5 mL/hr over 240 Minutes Intravenous Every 8 hours 12/02/18 2040     12/02/18 2045  vancomycin (VANCOCIN) 1,750 mg in sodium chloride 0.9 % 500 mL IVPB     1,750 mg 250 mL/hr over 120 Minutes Intravenous  Once 12/02/18 2040 12/03/18 0100       Assessment/Plan HTN A. Fib on heparin gtt, s/p cardioversion 10/29/18 - heparin on hold for procedure CHF w/ AICD and EF 15-20% Hep C CKD stage III DVT s/p IVC filter CVA 11/10/18 RLL PNA  Acute cholecystitis - HIDA positive - patient not a surgical candidate - IR drain placement on 10/24 -diet as tolerates -will need office follow up in 6 weeks and IR drain clinic follow up as well -unsure if patient will ever be a surgical candidate.  Will see how he progresses.  FEN: dysphagia I diet VTE: SCDs, heparin gtt  ID: Vanc 10/13-10/18; Zosyn 10/13>> Follow up: Dr. Kae Heller   LOS: 13 days    Henreitta Cea , [facility] Surgery 12/15/2018, 9:04 AM Please see Amion for  pager number during day hours 7:00am-4:30pm

## 2018-12-15 NOTE — Progress Notes (Signed)
Patient is refusing Gabapentin. He states 'I don't like the taste, I want my old one back. The one I used to take before". The writer explained to patient why the medication look different . Pt encouraged to take and educated, but refused to take.Marland Kitchen

## 2018-12-15 NOTE — Progress Notes (Signed)
ANTICOAGULATION CONSULT NOTE - Follow Up Consult  Pharmacy Consult forHeparin Indication: atrial fibrillation(CHADS2VASc= 5), extensive DVTs/p IVC filter  Allergies  Allergen Reactions  . Benadryl [Diphenhydramine] Palpitations    Patient Measurements: Height: 5\' 7"  (170.2 cm) Weight: 177 lb 0.5 oz (80.3 kg) IBW/kg (Calculated) : 66.1 Heparin Dosing Weight: 85 kg  Vital Signs: Temp: 98.2 F (36.8 C) (10/26 1126) Temp Source: Oral (10/26 1126) BP: 119/49 (10/26 1126) Pulse Rate: 84 (10/26 1238)  Labs: Recent Labs    12/13/18 0312  12/13/18 2312 12/14/18 0413 12/14/18 0824 12/15/18 0227  HGB 9.7*  --   --  9.9*  --   --   HCT 29.9*  --   --  30.7*  --   --   PLT 371  --   --  442*  --   --   HEPARINUNFRC 0.20*   < > 0.29*  --  0.35 0.44  CREATININE 1.02  --   --  0.95  --   --    < > = values in this interval not displayed.    Estimated Creatinine Clearance: 77.7 mL/min (by C-G formula based on SCr of 0.95 mg/dL).  Assessment: 44 yoM with rapid Afib(CHADS2VASc= 5)and LLEDVT s/p IVC filter, withhx recent hemorrhagic stroke. Pharmacy cleared to start IV heparin (low goal no bolus) by CCM and neurology with stable head CT on 10/19.Plan to transition to apixaban once cleared by neurosurgery. Hgb 10.8>9> 8.9. No bleeding per notes and RN.   HL 0.44 therapeutic. H/H and plt stable.   Goal of Therapy: Heparin level 0.3-0.5units/ml Monitor platelets by anticoagulation protocol: Yes  Plan: -Continue 2400 units/hr  -Daily Heparin level   Benetta Spar, PharmD, BCPS, [facility] Clinical Pharmacist

## 2018-12-15 NOTE — Progress Notes (Signed)
Progress Note  Patient Name: Glenn [name] Date of Encounter: 12/15/2018  Primary Cardiologist:   Virl Axe, MD   Subjective   No chest pain.  No SOB.   Inpatient Medications    Scheduled Meds: . chlorhexidine  15 mL Mouth Rinse BID  . Chlorhexidine Gluconate Cloth  6 each Topical Daily  . diclofenac sodium  2 g Topical QID  . digoxin  0.25 mg Oral Daily  . feeding supplement (ENSURE ENLIVE)  237 mL Oral TID BM  . furosemide  40 mg Oral Daily  . gabapentin  300 mg Oral Q8H  . mouth rinse  15 mL Mouth Rinse q12n4p  . metoprolol tartrate  25 mg Oral Q6H  . multivitamin with minerals  1 tablet Oral Daily  . pantoprazole  40 mg Oral Daily  . saccharomyces boulardii  250 mg Oral BID  . sacubitril-valsartan  1 tablet Oral BID  . sodium chloride flush  5 mL Intracatheter Q8H   Continuous Infusions: . sodium chloride Stopped (12/10/18 1036)  . heparin 2,400 Units/hr (12/15/18 0019)  . piperacillin-tazobactam (ZOSYN)  IV 3.375 g (12/15/18 0223)   PRN Meds: sodium chloride, acetaminophen, bisacodyl, calcium carbonate, ipratropium, levalbuterol, metoprolol tartrate   Vital Signs    Vitals:   12/14/18 2347 12/15/18 0020 12/15/18 0433 12/15/18 0630  BP: (!) 139/57  (!) 112/53   Pulse: 81 97 66 79  Resp: 18  (!) 24   Temp:   98.7 F (37.1 C)   TempSrc:   Oral   SpO2: 100%  100%   Weight:   80.3 kg   Height:        Intake/Output Summary (Last 24 hours) at 12/15/2018 0706 Last data filed at 12/14/2018 1757 Gross per 24 hour  Intake -  Output 15 ml  Net -15 ml   Filed Weights   12/13/18 0345 12/14/18 0455 12/15/18 0433  Weight: 81.4 kg 79.8 kg 80.3 kg    Telemetry    Atrial fib with controlled rate and PVCs with couplets and triplets - Personally Reviewed  ECG    NA - Personally Reviewed  Physical Exam   GEN: No acute distress.   Neck: No  JVD Cardiac: Irregular RR, no murmurs, rubs, or gallops.  Respiratory: Clear  to auscultation  bilaterally. GI: Soft, nontender, non-distended  MS: No  edema; No deformity. Neuro:  Nonfocal  Psych: Normal affect   Labs    Chemistry Recent Labs  Lab 12/11/18 0345 12/12/18 0348 12/12/18 1918 12/13/18 0312 12/14/18 0413  NA 135 135  --  133* 135  K 3.9 4.0 4.3 4.9 4.1  CL 99 99  --  100 102  CO2 26 25  --  23 22  GLUCOSE 108* 95  --  95 100*  BUN 30* 24*  --  16 13  CREATININE 1.12 1.15  --  1.02 0.95  CALCIUM 8.3* 8.6*  --  8.5* 8.8*  PROT 6.7 7.3  --   --   --   ALBUMIN 2.0* 2.2*  --   --   --   AST 32 24  --   --   --   ALT 29 24  --   --   --   ALKPHOS 102 105  --   --   --   BILITOT 0.9 0.7  --   --   --   GFRNONAA >60 >60  --  >60 >60  GFRAA >60 >60  --  >60 >60  ANIONGAP 10 11  --  10 11     Hematology Recent Labs  Lab 12/12/18 0348 12/13/18 0312 12/14/18 0413  WBC 8.7 8.1 9.2  RBC 2.88* 3.18* 3.25*  HGB 8.9* 9.7* 9.9*  HCT 27.4* 29.9* 30.7*  MCV 95.1 94.0 94.5  MCH 30.9 30.5 30.5  MCHC 32.5 32.4 32.2  RDW 13.2 13.1 13.3  PLT 359 371 442*    Cardiac EnzymesNo results for input(s): TROPONINI in the last 168 hours. No results for input(s): TROPIPOC in the last 168 hours.   BNPNo results for input(s): BNP, PROBNP in the last 168 hours.   DDimer No results for input(s): DDIMER in the last 168 hours.   Radiology    Ir Perc Cholecystostomy  Result Date: 12/13/2018 INDICATION: 68 year old male with acute calculus cholecystitis. He is currently not an operative candidate and therefore presents for percutaneous cholecystostomy tube placement. EXAM: CHOLECYSTOSTOMY MEDICATIONS: Patient is currently receiving intravenous antibiotics on a schedule. No additional antibiotic prophylaxis was administered. ANESTHESIA/SEDATION: Moderate (conscious) sedation was employed during this procedure. A total of Versed 1.5 mg and Fentanyl 75 mcg was administered intravenously. Moderate Sedation Time: 10 minutes. The patient's level of consciousness and vital signs were  monitored continuously by radiology nursing throughout the procedure under my direct supervision. FLUOROSCOPY TIME:  Fluoroscopy Time: 0 minutes 48 seconds (5 mGy). COMPLICATIONS: None immediate. PROCEDURE: Informed written consent was obtained from the patient after a thorough discussion of the procedural risks, benefits and alternatives. All questions were addressed. Maximal Sterile Barrier Technique was utilized including caps, mask, sterile gowns, sterile gloves, sterile drape, hand hygiene and skin antiseptic. A timeout was performed prior to the initiation of the procedure. The right upper quadrant was interrogated with ultrasound. The gallbladder is filled with sludge, debris and small stones. The wall is thickened and striated. A suitable skin entry site was selected and marked. The overlying skin was sterilely prepped and draped in the standard fashion using chlorhexidine skin prep. Local anesthesia was attained by infiltration with 1% lidocaine. A small dermatotomy was made. Under real-time sonographic guidance, a 21 gauge Accustick needle was advanced along a short transhepatic course and into the gallbladder lumen. A wire was advanced in the gallbladder lumen. The needle was exchanged for the Accustick sheath which was advanced over the wire and into the gallbladder lumen. A gentle hand injection of contrast material confirmed that the sheath was indeed within the gallbladder lumen. A short Amplatz wire was then advanced and coiled in the gallbladder lumen. The Accustick sheath was removed. The skin tract was dilated to 10 [place]. A Cook 10.2 [place] all-purpose drainage catheter was advanced over the wire and formed. Aspiration yields thick black bile. A sample was sent for Gram stain and culture. The catheter was gently flushed with saline and connected to gravity bag drainage. The catheter was secured to the skin with 0 Prolene suture. The patient tolerated the procedure well. IMPRESSION: Successful  placement of a percutaneous transhepatic cholecystostomy tube for acute calculus cholecystitis in a poor operative candidate. PLAN: 1. Maintain tube to gravity bag drainage. 2. Follow-up with Interventional Radiology in 4 weeks for through the tube cholangiogram to assess patency of the cystic and common bile ducts. Electronically Signed   By: Jacqulynn Cadet M.D.   On: 12/13/2018 16:08    Cardiac Studies   ECHO  12/07/18 1. Left ventricular ejection fraction, by visual estimation, is 20%. The left ventricle has severely decreased function. Mildly increased left ventricular size. There is mildly  increased left ventricular hypertrophy. 2. Left ventricular diastolic Doppler parameters are indeterminate in the setting of rapid atrial fibrillation. 3. Global right ventricle has mildly reduced systolic function.The right ventricular size is normal. No increase in right ventricular wall thickness. 4. Left atrial size was mild-moderately dilated. 5. Right atrial size was normal. 6. Trivial pericardial effusion is present. 7. The pericardial effusion is posterior to the left ventricle. 8. Mild aortic valve annular calcification. 9. The mitral valve is grossly normal. Mild mitral valve regurgitation. 10. The tricuspid valve is grossly normal. Tricuspid valve regurgitation mild-moderate. 11. The aortic valve is tricuspid Aortic valve regurgitation is mild to moderate by color flow Doppler. 12. The pulmonic valve was grossly normal. Pulmonic valve regurgitation is mild by color flow Doppler. 13. Moderately elevated pulmonary artery systolic pressure. 14. The inferior vena cava is normal in size with greater than 50% respiratory variability, suggesting right atrial pressure of 3 mmHg. 15. A device wire is visualized. 16. The tricuspid regurgitant velocity is 3.68 m/s, and with an assumed right atrial pressure of 3 mmHg, the estimated right ventricular systolic pressure is moderately  Patient  Profile     68 y.o. male with nonischemic cardiomyopathy status post ICD,persistent atrial fibrillation, left lower extremity DVT status post IVC filter, recent hemorrhagic stroke, CKD III, admitted with persistent fever and respiratory failure thought to be due to pneumonia. Cardiology has been assisting with atrial fibrillation with rapid ventricular response.  Assessment & Plan    Atrial fibrillation:    Holding off on oral agent until cleared by GI. Rate controlled.   Continue IV heparin and PO metoprolol for rate control as well as lowered dose of dig.   Likely will stop dig before discharge.    GI  S/P drainage GB in IR .   Hx DVT:  He will eventually need Eliquis.    CVA     Per primary team.   For questions or updates, please contact [facility] Please consult [email or web address] for contact info under Cardiology/STEMI.   Signed, Minus Breeding, MD  12/15/2018, 7:06 AM

## 2018-12-15 NOTE — Progress Notes (Signed)
  Speech Language Pathology Treatment: Dysphagia  Patient Details Name: Glenn [name] MRN: [id number] DOB: 08/05/1950 Today's Date: 12/15/2018 Time: [id number] SLP Time Calculation (min) (ACUTE ONLY): 11 min  Assessment / Plan / Recommendation Clinical Impression  Pt is alert today and making requests for more solid foods, such as "fried chicken." SLP provided small bites of soft solids with prolonged mastication noted. Min cues were provided, faded to supervision, for management of left-sided pocketing and anterior spillage. Recommend advancement to Dys 2 (finely chopped) solids, continuing thin liquids. Will f/u for tolerance.   HPI HPI: 68 year old male with past medical history significant for hypertension, hyperlipidemia, A. Fib previously on eliquis, HFrEF with EF 15-20% s/p ICD and cardioversion 10/29/2018, Hep C, CKD stage III, who admitted on 9/21 with left hemiparessis found to have large right MCA subacute hemorrhage. Additionally found to have acute nonischemic cardiomyopathy with EF 15-20% s/p ICD placement and cardioversion 9/9 for Afib. Pt with CIR admission from 9/25 to 10/13. Discharge back to acute with fever, A. fib with RVR, abdominal pain, respiratory distress with diagnosis of DVT. Pt awaiting IVC. Chest x-ray (12/04/18) concerning for increased opacity at the RIGHT lung base consistent with pleural effusion and atelectasis or infiltrate. Pt currently NPO d/t concerns of aspiration in setting of continued cognitive deficits and respiratory distress.       SLP Plan  Continue with current plan of care       Recommendations  Diet recommendations: Dysphagia 2 (fine chop);Thin liquid Liquids provided via: Cup;Straw Medication Administration: Crushed with puree Supervision: Staff to assist with self feeding;Full supervision/cueing for compensatory strategies Compensations: Minimize environmental distractions;Slow rate Postural Changes and/or Swallow Maneuvers: Seated  upright 90 degrees                Oral Care Recommendations: Oral care BID Follow up Recommendations: Skilled Nursing facility SLP Visit Diagnosis: Dysphagia, oropharyngeal phase (R13.12) Plan: Continue with current plan of care       GO                Venita Sheffield Siona Coulston 12/15/2018, 11:45 AM  Pollyann Glen, M.A. [place] Acute Environmental education officer [phone] Office [phone]

## 2018-12-15 NOTE — TOC Progression Note (Signed)
Transition of Care [facility]) - Progression Note    Patient Details  Name: Glenn [name] MRN: [id number] Date of Birth: [birth year]  [facility]) CM/SW Contact  Eileen Stanford, LCSW Phone Number: 12/15/2018, 10:46 AM  Clinical Narrative:   [facility] can accept however will need a updated PT note for auth.   Expected Discharge Plan: Skilled Nursing Facility Barriers to Discharge: Ship broker, Continued Medical Work up  Expected Discharge Plan and Services Expected Discharge Plan: [place] In-house Referral: Clinical Social Work   Post Acute Care Choice: [place] Living arrangements for the past 2 months: Single Family Home                 DME Arranged: N/A DME Agency: NA       HH Arranged: NA HH Agency: NA         Social Determinants of Health (SDOH) Interventions    Readmission Risk Interventions No flowsheet data found.

## 2018-12-15 NOTE — Care Management Important Message (Signed)
Important Message  Patient Details  Name: EDELMIRO NIEMI MRN: [id number] Date of Birth: 1950-04-23   Medicare Important Message Given:  Yes     [name] 12/15/2018, 2:03 PM

## 2018-12-16 DIAGNOSIS — R0602 Shortness of breath: Secondary | ICD-10-CM

## 2018-12-16 DIAGNOSIS — K810 Acute cholecystitis: Secondary | ICD-10-CM

## 2018-12-16 LAB — CBC
HCT: 30.5 % — ABNORMAL LOW (ref 39.0–52.0)
Hemoglobin: 10 g/dL — ABNORMAL LOW (ref 13.0–17.0)
MCH: 31 pg (ref 26.0–34.0)
MCHC: 32.8 g/dL (ref 30.0–36.0)
MCV: 94.4 fL (ref 80.0–100.0)
Platelets: 479 10*3/uL — ABNORMAL HIGH (ref 150–400)
RBC: 3.23 MIL/uL — ABNORMAL LOW (ref 4.22–5.81)
RDW: 13.9 % (ref 11.5–15.5)
WBC: 5.2 10*3/uL (ref 4.0–10.5)
nRBC: 0 % (ref 0.0–0.2)

## 2018-12-16 LAB — BODY FLUID CULTURE
Culture: NO GROWTH
Special Requests: NORMAL

## 2018-12-16 LAB — HEPARIN LEVEL (UNFRACTIONATED): Heparin Unfractionated: 0.64 IU/mL (ref 0.30–0.70)

## 2018-12-16 MED ORDER — METOPROLOL SUCCINATE ER 100 MG PO TB24
100.0000 mg | ORAL_TABLET | Freq: Every day | ORAL | Status: DC
  Administered 2018-12-16 – 2018-12-24 (×9): 100 mg via ORAL
  Filled 2018-12-16 (×9): qty 1

## 2018-12-16 NOTE — TOC Progression Note (Signed)
Transition of Care [facility]) - Progression Note    Patient Details  Name: Glenn [name] MRN: [id number] Date of Birth: Jun 25, 1950  Transition of Care [facility]) CM/SW [place], LCSW Phone Number: 12/16/2018, 4:10 PM  Clinical Narrative:   Additional clinicals faxed to [facility].    Expected Discharge Plan: Skilled Nursing Facility Barriers to Discharge: Ship broker, Continued Medical Work up  Expected Discharge Plan and Services Expected Discharge Plan: [place] In-house Referral: Clinical Social Work   Post Acute Care Choice: [place] Living arrangements for the past 2 months: Single Family Home                 DME Arranged: N/A DME Agency: NA       HH Arranged: NA HH Agency: NA         Social Determinants of Health (SDOH) Interventions    Readmission Risk Interventions No flowsheet data found.

## 2018-12-16 NOTE — Progress Notes (Signed)
  Speech Language Pathology Treatment: Dysphagia  Patient Details Name: Glenn [name] MRN: [id number] DOB: September 26, 1950 Today's Date: 12/16/2018 Time: [id number] SLP Time Calculation (min) (ACUTE ONLY): 14 min  Assessment / Plan / Recommendation Clinical Impression  Pt has no overt signs of aspiration but his mastication is prolonged and he has reduced awareness of anterior spillage of even moderately-sized pieces of graham crackers. Min cues were provided for self-management, but he used a liquid wash with Mod I to facilitate clearance of oral residue. Recommend continuing Dys 2 (chopped) solids and thin liquids. Will f/u for additional trials of advanced solids to facilitate advancement.    HPI HPI: 68 year old male with past medical history significant for hypertension, hyperlipidemia, A. Fib previously on eliquis, HFrEF with EF 15-20% s/p ICD and cardioversion 10/29/2018, Hep C, CKD stage III, who admitted on 9/21 with left hemiparessis found to have large right MCA subacute hemorrhage. Additionally found to have acute nonischemic cardiomyopathy with EF 15-20% s/p ICD placement and cardioversion 9/9 for Afib. Pt with CIR admission from 9/25 to 10/13. Discharge back to acute with fever, A. fib with RVR, abdominal pain, respiratory distress with diagnosis of DVT. Pt awaiting IVC. Chest x-ray (12/04/18) concerning for increased opacity at the RIGHT lung base consistent with pleural effusion and atelectasis or infiltrate. Pt currently NPO d/t concerns of aspiration in setting of continued cognitive deficits and respiratory distress.       SLP Plan  Continue with current plan of care       Recommendations  Diet recommendations: Dysphagia 2 (fine chop);Thin liquid Liquids provided via: Cup;Straw Medication Administration: Crushed with puree Supervision: Staff to assist with self feeding;Full supervision/cueing for compensatory strategies Compensations: Minimize environmental distractions;Slow  rate Postural Changes and/or Swallow Maneuvers: Seated upright 90 degrees                Oral Care Recommendations: Oral care BID Follow up Recommendations: Skilled Nursing facility SLP Visit Diagnosis: Dysphagia, oropharyngeal phase (R13.12) Plan: Continue with current plan of care       GO                Venita Sheffield Sonnet Rizor 12/16/2018, 3:55 PM  Pollyann Glen, M.A. [place] Acute Environmental education officer [phone] Office [phone]

## 2018-12-16 NOTE — Progress Notes (Addendum)
Occupational Therapy Evaluation Patient Details Name: Glenn [name] MRN: [id number] DOB: 17-Jul-1950 Today's Date: 12/16/2018    History of Present Illness Pt is a 68 y/o male admitted from CIR secondary to a fib with RVR. Also found to have DVT on LLE and is s/p IVC filter placement. Pt also developed acute cholecystitis and had drain placed in IR on 10/25. 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 PMH includes a fib, CHF, hep C, HTN, and s/p ICD.    Clinical Impression   Pt currently requires modA+2 for LB ADL, minA for UB ADL  and modA+2 with use of stedy for transfer from EOB to recliner. Educated pt on importance of incorporating LEU into daily activities and use RUE to assist with general UE HEP. Pt demonstrates cognitive and physical limitations impacting his safety and independence with ADL and functional mobility. Pt will continue to benefit from skilled OT services to maximize safety and independence with ADL/IADL and functional mobility. Recommend d/c to SNF. Will continue to follow acutely and progress as tolerated.     Follow Up Recommendations  SNF;Supervision/Assistance - 24 hour    Equipment Recommendations  Other (comment)    Recommendations for Other Services Rehab consult;PT consult;Speech consult     Precautions / Restrictions Precautions Precautions: Fall Precaution Comments: L hemi Restrictions Weight Bearing Restrictions: No      Mobility Bed Mobility Overal bed mobility: Needs Assistance Bed Mobility: Supine to Sit     Supine to sit: Mod assist;+2 for safety/equipment     General bed mobility comments: assist to bring LLE off bed and elevate trunk into sitting, pt initiated use of bed rails to progress trunk upright  Transfers Overall transfer level: Needs assistance Equipment used: Ambulation equipment used Transfers: Sit to/from Stand Sit to Stand: Mod assist;+2 physical assistance Stand pivot transfers: Mod assist;+2 physical  assistance       General transfer comment: assist to bring hips up and for balance. Pt stood x 3 with Stedy. Difficulty extending hips and trunk. Used Stedy to transfer bed to chair.     Balance Overall balance assessment: Needs assistance Sitting-balance support: Feet supported;Single extremity supported Sitting balance-Leahy Scale: Poor Sitting balance - Comments: UE support to maintain sitting Postural control: Left lateral lean Standing balance support: During functional activity;Single extremity supported Standing balance-Leahy Scale: Poor Standing balance comment: with stedy, stood x3 for max 10 seconds with modA +2                           ADL either performed or assessed with clinical judgement   ADL Overall ADL's : Needs assistance/impaired Eating/Feeding: Minimal assistance;Sitting   Grooming: Minimal assistance;Sitting   Upper Body Bathing: Minimal assistance;Sitting   Lower Body Bathing: Maximal assistance;+2 for safety/equipment;Sit to/from stand;+2 for physical assistance   Upper Body Dressing : Moderate assistance;Sitting   Lower Body Dressing: Maximal assistance;+2 for safety/equipment;+2 for physical assistance;Sit to/from stand   Toilet Transfer: Moderate assistance;+2 for physical assistance Toilet Transfer Details (indicate cue type and reason): simulated to the recliner, use of stedy Toileting- Clothing Manipulation and Hygiene: Moderate assistance;+2 for physical assistance Toileting - Clothing Manipulation Details (indicate cue type and reason): limited standing tolerance     Functional mobility during ADLs: Moderate assistance;Maximal assistance;+2 for physical assistance General ADL Comments: limited standing tolerance, educated on importance of incoorporating LUE into daily activities     Vision Baseline Vision/History: No visual deficits Vision Assessment?: Vision impaired-  to be further tested in functional  context;Yes Alignment/Gaze Preference: Gaze right Tracking/Visual Pursuits: Impaired - to be further tested in functional context     Perception     Praxis      Pertinent Vitals/Pain Pain Assessment: No/denies pain Pain Intervention(s): Monitored during session     Hand Dominance Right   Extremity/Trunk Assessment Upper Extremity Assessment Upper Extremity Assessment: LUE deficits/detail LUE Deficits / Details: demonstrated increased abiltiy for elbow flexion MMT 2/5, elbow flexion 1/5, trace muscle activation for shoulder flexion;decreased functional use;educated pt on general HEP and importance of incoorporating LUE in bilateral coordination and daily tasks LUE Sensation: WNL LUE Coordination: decreased fine motor;decreased gross motor   Lower Extremity Assessment Lower Extremity Assessment: Defer to PT evaluation LLE Deficits / Details: LLE weakness at baseline secondary to recent CVA.  LLE Sensation: (reports intact to light touch, but needs more formal assessment)   Cervical / Trunk Assessment Cervical / Trunk Assessment: Normal Cervical / Trunk Exceptions: Poor trunk control with left lateral lean in standing   Communication Communication Communication: Expressive difficulties;Receptive difficulties   Cognition Arousal/Alertness: Awake/alert Behavior During Therapy: WFL for tasks assessed/performed Overall Cognitive Status: No family/caregiver present to determine baseline cognitive functioning Area of Impairment: Memory;Following commands;Safety/judgement;Problem solving                 Orientation Level: Disoriented to;Place;Time;Situation Current Attention Level: Sustained Memory: Decreased recall of precautions;Decreased short-term memory Following Commands: Follows one step commands with increased time;Follows multi-step commands inconsistently Safety/Judgement: Decreased awareness of safety;Decreased awareness of deficits Awareness: Intellectual Problem  Solving: Difficulty sequencing;Requires verbal cues;Requires tactile cues General Comments: requires increased cues and time throughout session. Pt demonstrates increased awareness of deficits but still demonstrates poor memory, requesting OT to address LUE, initiated bringing LUE to stedy    General Comments  vss    Exercises     Shoulder Instructions      Home Living Family/patient expects to be discharged to:: Skilled nursing facility Living Arrangements: Non-relatives/Friends Available Help at Discharge: Family;Available 24 hours/day Type of Home: House Home Access: Stairs to enter [employer] of Steps: 5 Entrance Stairs-Rails: Can reach both Home Layout: One level     Bathroom Shower/Tub: Other (comment);Tub/shower unit   Armed forces training and education officer: Yes How Accessible: Accessible via walker Home Equipment: None      Lives With: Other (Comment)    Prior Functioning/Environment Level of Independence: Needs assistance  Gait / Transfers Assistance Needed: Was working on transfers and gait with therapy staff at [facility] [employer]  ADL's / Homemaking Assistance Needed: Required assist for ADL tasks.    Comments: Worked as a Therapist, music Problem List: Decreased strength;Decreased range of motion;Decreased activity tolerance;Impaired balance (sitting and/or standing);Decreased cognition;Decreased safety awareness;Decreased coordination;Impaired vision/perception;Decreased knowledge of use of DME or AE;Decreased knowledge of precautions;Impaired UE functional use;Pain      OT Treatment/Interventions: Self-care/ADL training;Therapeutic exercise;Energy conservation;DME and/or AE instruction;Therapeutic activities;Patient/family education    OT Goals(Current goals can be found in the care plan section) Acute Rehab OT Goals Patient Stated Goal: to use LUE more OT Goal Formulation: With patient Time For Goal Achievement: 12/30/18 Potential to Achieve  Goals: Good ADL Goals Pt Will Perform Grooming: with set-up;with supervision;sitting Pt Will Perform Lower Body Dressing: sit to/from stand;with min guard assist Pt Will Transfer to Toilet: with min guard assist;ambulating;bedside commode Pt Will Perform Toileting - Clothing Manipulation and hygiene: with min guard assist;sit to/from stand;sitting/lateral leans Pt/caregiver will Perform Home Exercise  Program: Increased ROM;Increased strength;Left upper extremity;With written HEP provided;With Supervision Additional ADL Goal #2: Pt will demosntrate increased attention to left side with Min cues to incorporate left UE  OT Frequency: Min 3X/week   Barriers to D/C:            Co-evaluation PT/OT/SLP Co-Evaluation/Treatment: Yes            AM-PAC OT "6 Clicks" Daily Activity     Outcome Measure Help from another person eating meals?: A Little Help from another person taking care of personal grooming?: A Little Help from another person toileting, which includes using toliet, bedpan, or urinal?: A Lot Help from another person bathing (including washing, rinsing, drying)?: A Lot Help from another person to put on and taking off regular upper body clothing?: A Little Help from another person to put on and taking off regular lower body clothing?: A Lot 6 Click Score: 15   End of Session Equipment Utilized During Treatment: Gait belt;Rolling walker Nurse Communication: Mobility status  Activity Tolerance: Patient tolerated treatment well Patient left: in chair;with call bell/phone within reach;with chair alarm set  OT Visit Diagnosis: Unsteadiness on feet (R26.81);Other abnormalities of gait and mobility (R26.89);Muscle weakness (generalized) (M62.81);Other symptoms and signs involving cognitive function;Hemiplegia and hemiparesis Hemiplegia - Right/Left: Left Hemiplegia - dominant/non-dominant: Non-Dominant Hemiplegia - caused by: Cerebral infarction                Time:  [id number] OT Time Calculation (min): 30 min Charges:  OT General Charges $OT Visit: 1 Visit OT Evaluation $OT Eval Moderate Complexity: 1 Mod OT Treatments $Self Care/Home Management : 8-22 mins  Dorinda Hill OTR/L Acute Rehabilitation Services Office: [phone]

## 2018-12-16 NOTE — Progress Notes (Signed)
Progress Note  Patient Name: Glenn [name] Date of Encounter: 12/16/2018  Primary Cardiologist:   Virl Axe, MD   Subjective   No complaints except weakness and some leg pain on the left side.   Inpatient Medications    Scheduled Meds: . atorvastatin  20 mg Oral q1800  . chlorhexidine  15 mL Mouth Rinse BID  . Chlorhexidine Gluconate Cloth  6 each Topical Daily  . diclofenac sodium  2 g Topical QID  . digoxin  0.25 mg Oral Daily  . feeding supplement (ENSURE ENLIVE)  237 mL Oral TID BM  . furosemide  40 mg Oral Daily  . gabapentin  300 mg Oral Q8H  . mouth rinse  15 mL Mouth Rinse q12n4p  . metoprolol tartrate  25 mg Oral Q6H  . multivitamin with minerals  1 tablet Oral Daily  . pantoprazole  40 mg Oral Daily  . saccharomyces boulardii  250 mg Oral BID  . sacubitril-valsartan  1 tablet Oral BID  . sodium chloride flush  5 mL Intracatheter Q8H   Continuous Infusions: . sodium chloride Stopped (12/10/18 1036)  . heparin 2,400 Units/hr (12/15/18 2236)  . piperacillin-tazobactam (ZOSYN)  IV 3.375 g (12/16/18 0307)   PRN Meds: sodium chloride, acetaminophen, bisacodyl, calcium carbonate, ipratropium, levalbuterol, metoprolol tartrate   Vital Signs    Vitals:   12/15/18 2347 12/16/18 0333 12/16/18 0527 12/16/18 0530  BP:   127/62 127/62  Pulse:   77 77  Resp:    (!) 21  Temp: 98.8 F (37.1 C)   98.1 F (36.7 C)  TempSrc: Oral   Oral  SpO2:    98%  Weight:  81.1 kg    Height:        Intake/Output Summary (Last 24 hours) at 12/16/2018 0802 Last data filed at 12/16/2018 0517 Gross per 24 hour  Intake 180 ml  Output 400 ml  Net -220 ml   Filed Weights   12/14/18 0455 12/15/18 0433 12/16/18 0333  Weight: 79.8 kg 80.3 kg 81.1 kg    Telemetry    Atrial fib with rapid rate with ambulation.  - Personally Reviewed  ECG    NA - Personally Reviewed  Physical Exam   GEN: No  acute distress.   Neck: No  JVD Cardiac: Irregular RR, no murmurs,  rubs, or gallops.  Respiratory: Clear   to auscultation bilaterally. GI: Soft, nontender, non-distended, normal bowel sounds  MS:   edema; No deformity. Neuro:   Left arm and leg weakness Psych: Oriented and appropriate    Labs    Chemistry Recent Labs  Lab 12/11/18 0345 12/12/18 0348 12/12/18 1918 12/13/18 0312 12/14/18 0413  NA 135 135  --  133* 135  K 3.9 4.0 4.3 4.9 4.1  CL 99 99  --  100 102  CO2 26 25  --  23 22  GLUCOSE 108* 95  --  95 100*  BUN 30* 24*  --  16 13  CREATININE 1.12 1.15  --  1.02 0.95  CALCIUM 8.3* 8.6*  --  8.5* 8.8*  PROT 6.7 7.3  --   --   --   ALBUMIN 2.0* 2.2*  --   --   --   AST 32 24  --   --   --   ALT 29 24  --   --   --   ALKPHOS 102 105  --   --   --   BILITOT 0.9 0.7  --   --   --  GFRNONAA >60 >60  --  >60 >60  GFRAA >60 >60  --  >60 >60  ANIONGAP 10 11  --  10 11     Hematology Recent Labs  Lab 12/12/18 0348 12/13/18 0312 12/14/18 0413  WBC 8.7 8.1 9.2  RBC 2.88* 3.18* 3.25*  HGB 8.9* 9.7* 9.9*  HCT 27.4* 29.9* 30.7*  MCV 95.1 94.0 94.5  MCH 30.9 30.5 30.5  MCHC 32.5 32.4 32.2  RDW 13.2 13.1 13.3  PLT 359 371 442*    Cardiac EnzymesNo results for input(s): TROPONINI in the last 168 hours. No results for input(s): TROPIPOC in the last 168 hours.   BNPNo results for input(s): BNP, PROBNP in the last 168 hours.   DDimer No results for input(s): DDIMER in the last 168 hours.   Radiology    No results found.  Cardiac Studies   ECHO  12/07/18 1. Left ventricular ejection fraction, by visual estimation, is 20%. The left ventricle has severely decreased function. Mildly increased left ventricular size. There is mildly increased left ventricular hypertrophy. 2. Left ventricular diastolic Doppler parameters are indeterminate in the setting of rapid atrial fibrillation. 3. Global right ventricle has mildly reduced systolic function.The right ventricular size is normal. No increase in right ventricular wall thickness.  4. Left atrial size was mild-moderately dilated. 5. Right atrial size was normal. 6. Trivial pericardial effusion is present. 7. The pericardial effusion is posterior to the left ventricle. 8. Mild aortic valve annular calcification. 9. The mitral valve is grossly normal. Mild mitral valve regurgitation. 10. The tricuspid valve is grossly normal. Tricuspid valve regurgitation mild-moderate. 11. The aortic valve is tricuspid Aortic valve regurgitation is mild to moderate by color flow Doppler. 12. The pulmonic valve was grossly normal. Pulmonic valve regurgitation is mild by color flow Doppler. 13. Moderately elevated pulmonary artery systolic pressure. 14. The inferior vena cava is normal in size with greater than 50% respiratory variability, suggesting right atrial pressure of 3 mmHg. 15. A device wire is visualized. 16. The tricuspid regurgitant velocity is 3.68 m/s, and with an assumed right atrial pressure of 3 mmHg, the estimated right ventricular systolic pressure is moderately  Patient Profile     68 y.o. male with nonischemic cardiomyopathy status post ICD,persistent atrial fibrillation, left lower extremity DVT status post IVC filter, recent hemorrhagic stroke, CKD III, admitted with persistent fever and respiratory failure thought to be due to pneumonia. Cardiology has been assisting with atrial fibrillation with rapid ventricular response.  Assessment & Plan    Atrial fibrillation:    Holding off on oral anticoagulation until clear from a neurosurgery standpoint.  Rate is increased when ambulating.  Continue Dig.    CHRONIC DIASTOLIC HF:   Back on Entresto.  Change to Toprol XL.  Will likely titrate this this admission for better rate control.       GI:  Status post percutaneous drainage.     Hx DVT:  He will eventually need Eliquis.  See above.   CVA     Per primary team.   For questions or updates, please contact [facility] Please consult [email or web address] for  contact info under Cardiology/STEMI.   Signed, Minus Breeding, MD  12/16/2018, 8:02 AM

## 2018-12-16 NOTE — Progress Notes (Signed)
ANTICOAGULATION CONSULT NOTE - Follow Up Consult   Pharmacy Consult forHeparin Indication: atrial fibrillation(CHADS2VASc= 5), extensive DVTs/p IVC filter  Allergies  Allergen Reactions  . Benadryl [Diphenhydramine] Palpitations    Patient Measurements: Height: 5\' 7"  (170.2 cm) Weight: 178 lb 12.7 oz (81.1 kg) IBW/kg (Calculated) : 66.1 Heparin Dosing Weight: 85 kg  Vital Signs: Temp: 98.2 F (36.8 C) (10/27 0925) Temp Source: Oral (10/27 0925) BP: 124/56 (10/27 0925) Pulse Rate: 73 (10/27 0936)  Labs: Recent Labs    12/14/18 0413 12/14/18 0824 12/15/18 0227 12/16/18 0727  HGB 9.9*  --   --  10.0*  HCT 30.7*  --   --  30.5*  PLT 442*  --   --  479*  HEPARINUNFRC  --  0.35 0.44 0.64  CREATININE 0.95  --   --   --     Estimated Creatinine Clearance: 78 mL/min (by C-G formula based on SCr of 0.95 mg/dL).  Assessment: 85 yoM with rapid Afib(CHADS2VASc= 5)and LLEDVT s/p IVC filter, withhx recent hemorrhagic stroke. Pharmacy cleared to start IV heparin (low goal no bolus) by CCM and neurology with stable head CT on 10/19.Plan to transition to apixaban once cleared by neurosurgery. Hgb 10.8>9> 8.9. No bleeding per notes and RN.   HL0.64 now supratherapeutic given his narrower goal range, possibly "accumulating" heparin since last dose change. No boluses given since 10/24. H/H and plt stable.   Goal of Therapy: Heparin level 0.3-0.5units/ml Monitor platelets by anticoagulation protocol: Yes  Plan: -Decr to 2300 units/hr -Daily Heparin level   Benetta Spar, PharmD, BCPS, [facility] Clinical Pharmacist  Please check AMION for all [place] phone numbers After 10:00 PM, call [place] [phone]

## 2018-12-16 NOTE — Progress Notes (Signed)
Patient ID: Glenn [name], male   DOB: 11-10-1950, 68 y.o.   MRN: [id number]       Subjective: On D1 diet and tolerating.  No nausea.  Abdominal pain ~resolved. Denies any complaints this morning  Objective: Vital signs in last 24 hours: Temp:  [98.1 F (36.7 C)-99.1 F (37.3 C)] 98.1 F (36.7 C) (10/27 0530) Pulse Rate:  [52-114] 77 (10/27 0530) Resp:  [18-27] 21 (10/27 0530) BP: (111-149)/(49-83) 127/62 (10/27 0530) SpO2:  [97 %-100 %] 98 % (10/27 0530) Weight:  [81.1 kg] 81.1 kg (10/27 0333) Last BM Date: 12/15/18  Intake/Output from previous day: 10/26 0701 - 10/27 0700 In: 180 [P.O.:180] Out: 400 [Urine:300; Drains:100] Intake/Output this shift: No intake/output data recorded.  PE: Abd: soft, NT, drain in place with bilious output, +BS, ND  Lab Results:  Recent Labs    12/14/18 0413 12/16/18 0727  WBC 9.2 5.2  HGB 9.9* 10.0*  HCT 30.7* 30.5*  PLT 442* 479*   BMET Recent Labs    12/14/18 0413  NA 135  K 4.1  CL 102  CO2 22  GLUCOSE 100*  BUN 13  CREATININE 0.95  CALCIUM 8.8*   PT/INR No results for input(s): LABPROT, INR in the last 72 hours. CMP     Component Value Date/Time   NA 135 12/14/2018 0413   NA 142 04/22/2017 1123   K 4.1 12/14/2018 0413   CL 102 12/14/2018 0413   CO2 22 12/14/2018 0413   GLUCOSE 100 (H) 12/14/2018 0413   BUN 13 12/14/2018 0413   BUN 8 04/22/2017 1123   CREATININE 0.95 12/14/2018 0413   CREATININE 1.40 (H) 12/03/2017 1407   CALCIUM 8.8 (L) 12/14/2018 0413   PROT 7.3 12/12/2018 0348   ALBUMIN 2.2 (L) 12/12/2018 0348   AST 24 12/12/2018 0348   ALT 24 12/12/2018 0348   ALT 45 05/21/2017 1520   ALKPHOS 105 12/12/2018 0348   BILITOT 0.7 12/12/2018 0348   GFRNONAA >60 12/14/2018 0413   GFRNONAA 69 05/21/2017 1510   GFRAA >60 12/14/2018 0413   GFRAA 80 05/21/2017 1510   Lipase  No results found for: LIPASE     Studies/Results: No results found.  Anti-infectives: Anti-infectives (From admission,  onward)   Start     Dose/Rate Route Frequency Ordered Stop   12/07/18 0800  vancomycin (VANCOCIN) 1,500 mg in sodium chloride 0.9 % 500 mL IVPB  Status:  Discontinued     1,500 mg 250 mL/hr over 120 Minutes Intravenous Every 24 hours 12/06/18 1206 12/07/18 0931   12/06/18 0330  vancomycin (VANCOCIN) 1,500 mg in sodium chloride 0.9 % 500 mL IVPB     1,500 mg 250 mL/hr over 120 Minutes Intravenous  Once 12/06/18 0323 12/06/18 0557   12/03/18 2200  vancomycin (VANCOCIN) 1,500 mg in sodium chloride 0.9 % 500 mL IVPB  Status:  Discontinued     1,500 mg 250 mL/hr over 120 Minutes Intravenous Every 24 hours 12/02/18 2115 12/03/18 0850   12/03/18 2100  vancomycin (VANCOCIN) 1,250 mg in sodium chloride 0.9 % 250 mL IVPB  Status:  Discontinued     1,250 mg 166.7 mL/hr over 90 Minutes Intravenous Every 24 hours 12/03/18 0850 12/03/18 1009   12/02/18 2100  piperacillin-tazobactam (ZOSYN) IVPB 3.375 g     3.375 g 12.5 mL/hr over 240 Minutes Intravenous Every 8 hours 12/02/18 2040     12/02/18 2045  vancomycin (VANCOCIN) 1,750 mg in sodium chloride 0.9 % 500 mL IVPB  1,750 mg 250 mL/hr over 120 Minutes Intravenous  Once 12/02/18 2040 12/03/18 0100       Assessment/Plan HTN A. Fib on heparin gtt, s/p cardioversion 10/29/18 - heparin on hold for procedure CHF w/ AICD and EF 15-20% Hep C CKD stage III DVT s/p IVC filter CVA 11/10/18 RLL PNA  Acute cholecystitis - HIDA positive - patient not a surgical candidate - IR drain placement on 10/24 -diet as tolerated per speech/medicine -will need office follow up in 6 weeks and IR drain clinic follow up as well -please let us know if questions/concerns arise - follow-up has also been placed in avs  FEN: dysphagia I diet VTE: SCDs, heparin gtt  ID: Vanc 10/13-10/18; Zosyn 10/13>> Follow up: Dr. Kae Heller   LOS: 39 days   Sharon Mt. Dema Severin, M.D. [facility]

## 2018-12-16 NOTE — Plan of Care (Signed)
  Problem: Education: Goal: Knowledge of General Education information will improve Description: Including pain rating scale, medication(s)/side effects and non-pharmacologic comfort measures Outcome: Progressing   Problem: Health Behavior/Discharge Planning: Goal: Ability to manage health-related needs will improve Outcome: Progressing   Problem: Clinical Measurements: Goal: Will remain free from infection Outcome: Progressing   Problem: Activity: Goal: Risk for activity intolerance will decrease Outcome: Progressing   Problem: Nutrition: Goal: Adequate nutrition will be maintained Outcome: Progressing   Problem: Coping: Goal: Level of anxiety will decrease Outcome: Progressing   Problem: Pain Managment: Goal: General experience of comfort will improve Outcome: Progressing   

## 2018-12-16 NOTE — Progress Notes (Signed)
PROGRESS NOTE    Glenn [name]  [id number] DOB: [birth year] DOA: 12/02/2018 PCP: [name], MD    Brief Narrative:  67 year old gentleman prior history of atrial fibrillation on Eliquis, ischemic cardiomyopathy with left ventricular ejection fraction of 15 to 20% s/p ICD and cardioversion on September 2020, stage III CKD hypertension, hyperlipidemia, recent hospitalization for right MCA subacute hemorrhagic CVA 11/10/18 till 12/02/18 and subsequent discharge to CIR is back with atrial fibrillation with RVR and sepsis secondary to aspiration pneumonia and acute cholecystitis, possible acute on chronic diastolic heart failure and extensive DVT with IVC placement. Currently patient is being followed by cardiology, surgery. For his acute cholecystitis in view of multiple medical problems patient is not a surgical candidate hence he underwent percutaneous cholecystostomy drain placement by IR on 12/13/2018.  He is currently on IV Zosyn . Patient was transferred to [facility] service on 12/10/2018.     Assessment & Plan:   Active Problems:   Rapid atrial fibrillation ([name])   Acute deep vein thrombosis (DVT) of both lower extremities ([name])  Atrial fibrillation with RVR Rate controlled and patient transitioned off the Cardizem drip.  Patient is currently on IV heparin for anticoagulation. Cardiology on board and plan to switch to oral Eliquis once cleared by neurosurgery. Meanwhile continue with metoprolol 100 mg daily, digoxin 0.25 mg daily for rate control.   Sepsis secondary to pneumonia/aspiration pneumonia and acute cholecystitis. Right upper quadrant ultrasound and HIDA positive for acute cholecystitis.  General surgery consulted and suggested patient is not a surgical candidate and he underwent percutaneous cholecystotomy drain placement by IR on 12/09/2018.  As per the patient he denies any nausea vomiting or abdominal pain at this time. Currently he is on dysphagia 2 diet with  thin liquids and able to tolerate without any symptoms. Meanwhile continue with IV Zosyn for both aspiration pneumonia and acute cholecystitis.    Chronic systolic heart failure. S/p ICD placement. Echocardiogram on 10/18 showed left ventricular ejection fraction of 15 to 20%. He was restarted on Entresto and Lasix per cardiology. Continue with strict intake and output and daily weights. Further management as per cardiology.    Extensive DVT of the left common femoral vein, left popliteal vein, left proximal profunda vein, left posterior tibial veins, left peroneal veins and left gastrocnemius veins , s/p IVC filter Patient is on IV heparin for anticoagulation and plan to transition to oral Eliquis once cleared by neurosurgery.    History of MCA hemorrhagic stroke with residual left hemiparesis/dysphagia Hospitalization from 9/21 till 10/13 to CIR. No new focal deficits at this time. Further management as per neurosurgery. PT/OT evaluation recommending SNF placement.  Clinical social worker aware   Malnutrition due to inadequate oral intake.  And dysphagia SLP eval recommending dysphagia 2 diet with thin liquids.   AKI Resolved   History of hepatitis C Outpatient follow-up with ID.    Anemia of chronic disease, normocytic anemia Hemoglobin remained stable around 10.   Mild thrombocytosis Probably from the acute cholecystitis. Continue to monitor  DVT prophylaxis: On heparin GTT. Code Status: Full code Family Communication: None at bedside Disposition Plan: Pending clinical improvement.   Consultants:   General surgery  Cardiology  Procedures:  percutaneous cholecystostomy drain placement by IR on 12/13/2018.   Antimicrobials: IV Zosyn and biliary cultures are pending. Subjective: Patient appears to be comfortable He opened his eyes on verbal cues and denies any complaints at this time.  Objective: Vitals:   12/16/18 [id number] 12/16/18 0530 12/16/18 [id number]  12/16/18 0936  BP: 127/62 127/62 (!) 124/56   Pulse: 77 77 82 73  Resp:  (!) 21    Temp:  98.1 F (36.7 C) 98.2 F (36.8 C)   TempSrc:  Oral Oral   SpO2:  98%    Weight:      Height:        Intake/Output Summary (Last 24 hours) at 12/16/2018 1049 Last data filed at 12/16/2018 0940 Gross per 24 hour  Intake 280 ml  Output 475 ml  Net -195 ml   Filed Weights   12/14/18 0455 12/15/18 0433 12/16/18 0333  Weight: 79.8 kg 80.3 kg 81.1 kg    Examination:  General exam: Appears calm and comfortable  Respiratory system: Clear to auscultation. Respiratory effort normal. Cardiovascular system: S1 & S2 heard, irregular  gastrointestinal system: Abdomen is nondistended, soft and nontender.  Drain in place [place] nervous system: Woke up on verbal cues and answering questions. Extremities: Symmetric 5 x 5 power. Skin: No rashes, lesions or ulcers Psychiatry: Flat affect    Data Reviewed: I have personally reviewed following labs and imaging studies  CBC: Recent Labs  Lab 12/10/18 1731  12/11/18 1512 12/12/18 0348 12/13/18 0312 12/14/18 0413 12/16/18 0727  WBC 14.7*   < > 10.0 8.7 8.1 9.2 5.2  NEUTROABS 10.3*  --   --   --   --   --   --   HGB 10.8*   < > 9.0* 8.9* 9.7* 9.9* 10.0*  HCT 32.3*   < > 27.9* 27.4* 29.9* 30.7* 30.5*  MCV 94.7   < > 95.9 95.1 94.0 94.5 94.4  PLT 337   < > 363 359 371 442* 479*   < > = values in this interval not displayed.   Basic Metabolic Panel: Recent Labs  Lab 12/10/18 0739 12/11/18 0345 12/12/18 0348 12/12/18 1918 12/13/18 0312 12/14/18 0413  NA 136 135 135  --  133* 135  K 3.7 3.9 4.0 4.3 4.9 4.1  CL 95* 99 99  --  100 102  CO2 27 26 25   --  23 22  GLUCOSE 115* 108* 95  --  95 100*  BUN 39* 30* 24*  --  16 13  CREATININE 1.36* 1.12 1.15  --  1.02 0.95  CALCIUM 8.7* 8.3* 8.6*  --  8.5* 8.8*  MG 2.6* 2.3 2.4 2.3 2.3 2.1  PHOS 3.7  --   --   --   --  3.4   GFR: Estimated Creatinine Clearance: 78 mL/min (by C-G formula based  on SCr of 0.95 mg/dL). Liver Function Tests: Recent Labs  Lab 12/11/18 0345 12/12/18 0348  AST 32 24  ALT 29 24  ALKPHOS 102 105  BILITOT 0.9 0.7  PROT 6.7 7.3  ALBUMIN 2.0* 2.2*   No results for input(s): LIPASE, AMYLASE in the last 168 hours. No results for input(s): AMMONIA in the last 168 hours. Coagulation Profile: No results for input(s): INR, PROTIME in the last 168 hours. Cardiac Enzymes: No results for input(s): CKTOTAL, CKMB, CKMBINDEX, TROPONINI in the last 168 hours. BNP (last 3 results) No results for input(s): PROBNP in the last 8760 hours. HbA1C: No results for input(s): HGBA1C in the last 72 hours. CBG: Recent Labs  Lab 12/09/18 2355 12/10/18 0347 12/10/18 0732 12/10/18 1119 12/10/18 1514  GLUCAP 128* 107* 109* 118* 108*   Lipid Profile: No results for input(s): CHOL, HDL, LDLCALC, TRIG, CHOLHDL, LDLDIRECT in the last 72 hours. Thyroid Function Tests: No results  for input(s): TSH, T4TOTAL, FREET4, T3FREE, THYROIDAB in the last 72 hours. Anemia Panel: No results for input(s): VITAMINB12, FOLATE, FERRITIN, TIBC, IRON, RETICCTPCT in the last 72 hours. Sepsis Labs: No results for input(s): PROCALCITON, LATICACIDVEN in the last 168 hours.  Recent Results (from the past 240 hour(s))  Expectorated sputum assessment w rflx to resp cult     Status: None   Collection Time: 12/06/18  9:06 PM   Specimen: SPU  Result Value Ref Range Status   Specimen Description SPUTUM  Final   Special Requests NONE  Final   Sputum evaluation   Final    Sputum specimen not acceptable for testing.  Please recollect.   RESULT CALLED TO, READ BACK BY AND VERIFIED WITH: RN York Cerise CRUZ [id number] FCP Performed at [facility] [address] [place], [place] [address]    Report Status 12/07/2018 FINAL  Final  Body fluid culture     Status: None (Preliminary result)   Collection Time: 12/13/18  1:29 PM   Specimen: BILE; Body Fluid  Result Value Ref Range Status    Specimen Description BILE  Final   Special Requests Normal  Final   Gram Stain   Final    RARE WBC PRESENT,BOTH PMN AND MONONUCLEAR NO ORGANISMS SEEN    Culture   Final    NO GROWTH 2 DAYS Performed at [facility], [place] [address] [place], [place] [address]    Report Status PENDING  Incomplete         Radiology Studies: No results found.      Scheduled Meds: . atorvastatin  20 mg Oral q1800  . chlorhexidine  15 mL Mouth Rinse BID  . Chlorhexidine Gluconate Cloth  6 each Topical Daily  . diclofenac sodium  2 g Topical QID  . digoxin  0.25 mg Oral Daily  . feeding supplement (ENSURE ENLIVE)  237 mL Oral TID BM  . furosemide  40 mg Oral Daily  . gabapentin  300 mg Oral Q8H  . mouth rinse  15 mL Mouth Rinse q12n4p  . metoprolol succinate  100 mg Oral Daily  . multivitamin with minerals  1 tablet Oral Daily  . pantoprazole  40 mg Oral Daily  . saccharomyces boulardii  250 mg Oral BID  . sacubitril-valsartan  1 tablet Oral BID  . sodium chloride flush  5 mL Intracatheter Q8H   Continuous Infusions: . sodium chloride Stopped (12/10/18 1036)  . heparin 2,400 Units/hr (12/16/18 1010)  . piperacillin-tazobactam (ZOSYN)  IV 3.375 g (12/16/18 1007)     LOS: 14 days        Hosie Poisson, MD [facility] Pager [phone] If 7PM-7AM, please contact night-coverage [email or web address] Password [email or web address] 12/16/2018, 10:49 AM

## 2018-12-16 NOTE — Progress Notes (Signed)
Referring Physician(s): Dr. Wendee Beavers  Supervising Physician: Aletta Edouard  Patient Status:  Glenn [name] - In-pt  Chief Complaint:  Acute cholecystitis S/P perc chole by Dr. Laurence Ferrari 12/13/2018  Subjective:  Mr. Vitatoe is sleeping during my exam today.  Allergies: Benadryl [diphenhydramine]  Medications: Prior to Admission medications   Medication Sig Start Date End Date Taking? Authorizing Provider  acetaminophen (TYLENOL) 325 MG tablet Take 2 tablets (650 mg total) by mouth every 4 (four) hours as needed for mild pain (or temp > 37.5 C (99.5 F)). 12/02/18   Angiulli, Lavon Paganini, PA-C  atorvastatin (LIPITOR) 20 MG tablet Take 1 tablet (20 mg total) by mouth daily at 6 PM. 11/13/18   Donzetta Starch, NP  carvedilol (COREG) 12.5 MG tablet Take 1 tablet (12.5 mg total) by mouth 2 (two) times daily with a meal. 12/02/18   Angiulli, Lavon Paganini, PA-C  ceFEPIme 2 g in sodium chloride 0.9 % 100 mL Inject 2 g into the vein every 8 (eight) hours. 12/02/18   Angiulli, Lavon Paganini, PA-C  ciprofloxacin (CILOXAN) 0.3 % ophthalmic solution Place 1 drop into the left eye 4 (four) times daily. Administer 1 drop, every 2 hours, while awake, for 2 days. Then 1 drop, every 4 hours, while awake, for the next 5 days. 12/02/18   Angiulli, Lavon Paganini, PA-C  divalproex (DEPAKOTE SPRINKLE) 125 MG capsule Take 1 capsule (125 mg total) by mouth every 12 (twelve) hours. 12/02/18   Angiulli, Lavon Paganini, PA-C  furosemide (LASIX) 40 MG tablet Take 1 tablet (40 mg total) by mouth daily. 11/06/18   Mullis, Kiersten P, DO  gabapentin (NEURONTIN) 300 MG capsule TAKE 1 CAPSULE(300 MG) BY MOUTH THREE TIMES DAILY Patient taking differently: Take 300 mg by mouth 3 (three) times daily.  10/10/18   [name], MD  lidocaine (LIDODERM) 5 % Place 1 patch onto the skin daily. Remove & Discard patch within 12 hours or as directed by MD 12/03/18   Angiulli, Lavon Paganini, PA-C  lidocaine (LMX) 4 % cream Apply topically 3 (three) times  daily. 12/02/18   Angiulli, Lavon Paganini, PA-C  modafinil (PROVIGIL) 100 MG tablet Take 1 tablet (100 mg total) by mouth daily at 6 (six) AM. 12/03/18   Angiulli, Lavon Paganini, PA-C  potassium chloride SA (K-DUR) 20 MEQ tablet Take 1 tablet (20 mEq total) by mouth daily. 11/06/18   Mullis, Kiersten P, DO  sacubitril-valsartan (ENTRESTO) 24-26 MG Take 1 tablet by mouth 2 (two) times daily. 11/06/18   Mullis, Kiersten P, DO  spironolactone (ALDACTONE) 25 MG tablet Take 0.5 tablets (12.5 mg total) by mouth daily. 12/03/18   Angiulli, Lavon Paganini, PA-C  topiramate (TOPAMAX) 25 MG tablet Take 1 tablet (25 mg total) by mouth at bedtime. 12/02/18   Angiulli, Lavon Paganini, PA-C  traMADol (ULTRAM) 50 MG tablet Take 1 tablet (50 mg total) by mouth every 8 (eight) hours as needed for severe pain. 12/02/18   Angiulli, Lavon Paganini, PA-C     Vital Signs: BP (!) 119/58 (BP Location: Left Arm)    Pulse 62    Temp 99.3 F (37.4 C) (Tympanic)    Resp 19    Ht 5\' 7"  (1.702 m)    Wt 81.1 kg    SpO2 97%    BMI 28.00 kg/m   Physical Exam Asleep, NAD RUQ drain in place. ~200 mL bilious drainage in gravity bag, no purulence. ~100 mL output recorded  Imaging: Nm Hepatobiliary Liver Func  Result Date: 12/12/2018  CLINICAL DATA:  Right upper quadrant pain.  Cholecystitis suspected. EXAM: NUCLEAR MEDICINE HEPATOBILIARY IMAGING TECHNIQUE: Sequential images of the abdomen were obtained out to 60 minutes following intravenous administration of radiopharmaceutical. RADIOPHARMACEUTICALS:  5.47 mCi Tc-64m  Choletec IV COMPARISON:  Ultrasound 12/11/2018 FINDINGS: Prompt uptake and biliary excretion of activity by the liver is seen. Biliary activity passes into small bowel, consistent with patent common bile duct. No gallbladder activity visualized within the first hour. The patient then received 3 mg of morphine the intravenous infusion and subsequent imaging was carried out for an additional 30 minutes. No gallbladder activity noted post  morphine. IMPRESSION: 1. Nonvisualization of the gallbladder which may reflect cystic duct obstruction and acute cholecystitis. Electronically Signed   By: Kerby Moors M.D.   On: 12/12/2018 16:39   Ir Perc Cholecystostomy  Result Date: 12/13/2018 INDICATION: 68 year old male with acute calculus cholecystitis. He is currently not an operative candidate and therefore presents for percutaneous cholecystostomy tube placement. EXAM: CHOLECYSTOSTOMY MEDICATIONS: Patient is currently receiving intravenous antibiotics on a schedule. No additional antibiotic prophylaxis was administered. ANESTHESIA/SEDATION: Moderate (conscious) sedation was employed during this procedure. A total of Versed 1.5 mg and Fentanyl 75 mcg was administered intravenously. Moderate Sedation Time: 10 minutes. The patient's level of consciousness and vital signs were monitored continuously by radiology nursing throughout the procedure under my direct supervision. FLUOROSCOPY TIME:  Fluoroscopy Time: 0 minutes 48 seconds (5 mGy). COMPLICATIONS: None immediate. PROCEDURE: Informed written consent was obtained from the patient after a thorough discussion of the procedural risks, benefits and alternatives. All questions were addressed. Maximal Sterile Barrier Technique was utilized including caps, mask, sterile gowns, sterile gloves, sterile drape, hand hygiene and skin antiseptic. A timeout was performed prior to the initiation of the procedure. The right upper quadrant was interrogated with ultrasound. The gallbladder is filled with sludge, debris and small stones. The wall is thickened and striated. A suitable skin entry site was selected and marked. The overlying skin was sterilely prepped and draped in the standard fashion using chlorhexidine skin prep. Local anesthesia was attained by infiltration with 1% lidocaine. A small dermatotomy was made. Under real-time sonographic guidance, a 21 gauge Accustick needle was advanced along a short  transhepatic course and into the gallbladder lumen. A wire was advanced in the gallbladder lumen. The needle was exchanged for the Accustick sheath which was advanced over the wire and into the gallbladder lumen. A gentle hand injection of contrast material confirmed that the sheath was indeed within the gallbladder lumen. A short Amplatz wire was then advanced and coiled in the gallbladder lumen. The Accustick sheath was removed. The skin tract was dilated to 10 [place]. A Cook 10.2 [place] all-purpose drainage catheter was advanced over the wire and formed. Aspiration yields thick black bile. A sample was sent for Gram stain and culture. The catheter was gently flushed with saline and connected to gravity bag drainage. The catheter was secured to the skin with 0 Prolene suture. The patient tolerated the procedure well. IMPRESSION: Successful placement of a percutaneous transhepatic cholecystostomy tube for acute calculus cholecystitis in a poor operative candidate. PLAN: 1. Maintain tube to gravity bag drainage. 2. Follow-up with Interventional Radiology in 4 weeks for through the tube cholangiogram to assess patency of the cystic and common bile ducts. Electronically Signed   By: Jacqulynn Cadet M.D.   On: 12/13/2018 16:08    Labs:  CBC: Recent Labs    12/12/18 0348 12/13/18 0312 12/14/18 0413 12/16/18 0727  WBC 8.7 8.1  9.2 5.2  HGB 8.9* 9.7* 9.9* 10.0*  HCT 27.4* 29.9* 30.7* 30.5*  PLT 359 371 442* 479*    COAGS: Recent Labs    11/01/18 1814 11/03/18 0604 11/10/18 1753  11/11/18 0329 11/11/18 0953 12/02/18 2018 12/04/18 0333  INR  --   --  1.2   < > 1.2 1.3* 1.4* 1.4*  APTT 35 51* 31  --   --   --  31  --    < > = values in this interval not displayed.    BMP: Recent Labs    12/11/18 0345 12/12/18 0348 12/12/18 1918 12/13/18 0312 12/14/18 0413  NA 135 135  --  133* 135  K 3.9 4.0 4.3 4.9 4.1  CL 99 99  --  100 102  CO2 26 25  --  23 22  GLUCOSE 108* 95  --  95 100*    BUN 30* 24*  --  16 13  CALCIUM 8.3* 8.6*  --  8.5* 8.8*  CREATININE 1.12 1.15  --  1.02 0.95  GFRNONAA >60 >60  --  >60 >60  GFRAA >60 >60  --  >60 >60    LIVER FUNCTION TESTS: Recent Labs    12/02/18 1528 12/02/18 2209 12/11/18 0345 12/12/18 0348  BILITOT 0.8 0.8 0.9 0.7  AST 25 26 32 24  ALT 16 16 29 24   ALKPHOS 68 61 102 105  PROT 7.4 6.5 6.7 7.3  ALBUMIN 3.1* 2.7* 2.0* 2.2*    Assessment and Plan:  Cholecystitis.  Non surgical candidate.  S/P percutaneous cholecystostomy by Dr. Laurence Ferrari on 12/10/2018  Stable  Continue routine drain care.  Electronically Signed: Murrell Redden, PA-C 12/16/2018, 2:07 PM    I spent a total of 15 Minutes at the the patient's bedside AND on the patient's hospital floor or unit, greater than 50% of which was counseling/coordinating care for perc chole.

## 2018-12-17 LAB — HEPARIN LEVEL (UNFRACTIONATED): Heparin Unfractionated: 0.29 IU/mL — ABNORMAL LOW (ref 0.30–0.70)

## 2018-12-17 LAB — BASIC METABOLIC PANEL
Anion gap: 9 (ref 5–15)
BUN: 8 mg/dL (ref 8–23)
CO2: 25 mmol/L (ref 22–32)
Calcium: 8.8 mg/dL — ABNORMAL LOW (ref 8.9–10.3)
Chloride: 103 mmol/L (ref 98–111)
Creatinine, Ser: 1.04 mg/dL (ref 0.61–1.24)
GFR calc Af Amer: >60 mL/min (ref >60)
GFR calc non Af Amer: >60 mL/min (ref >60)
Glucose, Bld: 113 mg/dL — ABNORMAL HIGH (ref 70–99)
Potassium: 4 mmol/L (ref 3.5–5.1)
Sodium: 137 mmol/L (ref 135–145)

## 2018-12-17 LAB — CBC
HCT: 30.9 % — ABNORMAL LOW (ref 39.0–52.0)
Hemoglobin: 10 g/dL — ABNORMAL LOW (ref 13.0–17.0)
MCH: 31.1 pg (ref 26.0–34.0)
MCHC: 32.4 g/dL (ref 30.0–36.0)
MCV: 96 fL (ref 80.0–100.0)
Platelets: 361 10*3/uL (ref 150–400)
RBC: 3.22 MIL/uL — ABNORMAL LOW (ref 4.22–5.81)
RDW: 14.3 % (ref 11.5–15.5)
WBC: 4.4 10*3/uL (ref 4.0–10.5)
nRBC: 0 % (ref 0.0–0.2)

## 2018-12-17 LAB — MAGNESIUM: Magnesium: 1.9 mg/dL (ref 1.7–2.4)

## 2018-12-17 NOTE — Progress Notes (Signed)
Nutrition Follow-up  INTERVENTION:   -D/c Ensure -MVI with minerals daily -Magic cup TID with meals, each supplement provides 290 kcal and 9 grams of protein -Hormel shake TID with meals, each supplement provides 520 kcals and 22 grams protein  NUTRITION DIAGNOSIS:   Inadequate oral intake related to inability to eat as evidenced by NPO status.  Now on dysphagia 2 diet.  GOAL:   Patient will meet greater than or equal to 90% of their needs  Progressing.  MONITOR:   PO intake, Supplement acceptance, Diet advancement, Labs, Weight trends, Skin, I & O's  ASSESSMENT:   68 year old male who transferred from CIR on 10/13 with worsening respiratory distress. PMH significant for HTN, HLD, atrial fibrillation, CHF, s/p ICD and cardioversion 10/29/18, hepatitis C, CKD stage III. Pt was recently admitted on 9/21 with left hemiparesis and found to have large MCA subacute hemorrhage. After transfer to acute care, CT showing small right pleural effusion, gallstones with no evidence of cholecystitis. Pt transferred to the ICU.  10/15- s/p IVC filter insertion 10/16- cortrak placed (tip of tube in stomach), TF initiated 10/17- s/p BSE- advanced to dysphagia 1 diet with thin liquids 10/18- cortrak d/c, TF d/c 10/24: s/p perc chole drain placed  **RD working [employer] was advanced to dysphagia 2 diet on 10/26 per SLP recommendation. Pt not drinking Ensure supplements given that he reports it causes loose stools. Pt is also on IV antibiotics which may be a contributing factor as well. Will d/c. Will continue other supplements at this time.  Pt has been consuming 50-75% of meals at this time.   Admission weight: 187 lbs. Current weight: 177 lbs. I/Os: -3.9L since 10/14  Labs reviewed. Medications: Lasix tablet, Multivitamin with minerals daily, Florastor capsule  Diet Order:   Diet Order            DIET DYS 2 Room service appropriate? Yes; Fluid consistency: Thin  Diet  effective now              EDUCATION NEEDS:   Education needs have been addressed  Skin:  Skin Assessment: Skin Integrity Issues: Skin Integrity Issues:: Other (Comment), Incisions Incisions: closed rt neck Other: MASD bilateral groin  Last BM:  10/28 - type 6  Height:   Ht Readings from Last 1 Encounters:  12/08/18 5\' 7"  (1.702 m)    Weight:   Wt Readings from Last 1 Encounters:  12/17/18 80.4 kg    Ideal Body Weight:  67.3 kg  BMI:  Body mass index is 27.76 kg/m.  Estimated Nutritional Needs:   Kcal:  2000-2200  Protein:  90-105 grams  Fluid:  >/= 2.0 L  Clayton Bibles, MS, RD, LDN Inpatient Clinical Dietitian Pager: [phone] After Hours Pager: [phone]

## 2018-12-17 NOTE — Progress Notes (Signed)
ANTICOAGULATION CONSULT NOTE - Follow Up Consult   Pharmacy Consult forHeparin Indication: atrial fibrillation(CHADS2VASc= 5), extensive DVTs/p IVC filter  Allergies  Allergen Reactions  . Benadryl [Diphenhydramine] Palpitations    Patient Measurements: Height: 5\' 7"  (170.2 cm) Weight: 177 lb 4 oz (80.4 kg) IBW/kg (Calculated) : 66.1 Heparin Dosing Weight: 85 kg  Vital Signs: Temp: 98.4 F (36.9 C) (10/28 0454) Temp Source: Oral (10/28 0454) BP: 142/65 (10/28 0454) Pulse Rate: 54 (10/28 0454)  Labs: Recent Labs    12/15/18 0227 12/16/18 0727 12/17/18 0428  HGB  --  10.0* 10.0*  HCT  --  30.5* 30.9*  PLT  --  479* 361  HEPARINUNFRC 0.44 0.64 0.29*    Estimated Creatinine Clearance: 77.7 mL/min (by C-G formula based on SCr of 0.95 mg/dL).  Assessment: 30 yoM with rapid Afib(CHADS2VASc= 5)and LLEDVT s/p IVC filter, withhx recent hemorrhagic stroke. Pharmacy cleared to start IV heparin (low goal no bolus) by CCM and neurology with stable head CT on 10/19.Plan to transition to apixaban once cleared by neurosurgery.  -heparin level= 0.29  Goal of Therapy: Heparin level 0.3-0.5units/ml Monitor platelets by anticoagulation protocol: Yes  Plan: -Increase heparin to 2350 units/hr -Daily heparin level and CBC  Hildred Laser, PharmD Clinical Pharmacist **Pharmacist phone directory can now be found on [email or web address] (PW [email or web address]).  Listed under [place].

## 2018-12-17 NOTE — Progress Notes (Signed)
Progress Note  Patient Name: Glenn [name] Date of Encounter: 12/17/2018  Primary Cardiologist:   Virl Axe, MD   Subjective   No chest pain, SOB or palpitations.    Inpatient Medications    Scheduled Meds: . atorvastatin  20 mg Oral q1800  . chlorhexidine  15 mL Mouth Rinse BID  . Chlorhexidine Gluconate Cloth  6 each Topical Daily  . diclofenac sodium  2 g Topical QID  . digoxin  0.25 mg Oral Daily  . feeding supplement (ENSURE ENLIVE)  237 mL Oral TID BM  . furosemide  40 mg Oral Daily  . gabapentin  300 mg Oral Q8H  . mouth rinse  15 mL Mouth Rinse q12n4p  . metoprolol succinate  100 mg Oral Daily  . multivitamin with minerals  1 tablet Oral Daily  . pantoprazole  40 mg Oral Daily  . saccharomyces boulardii  250 mg Oral BID  . sacubitril-valsartan  1 tablet Oral BID  . sodium chloride flush  5 mL Intracatheter Q8H   Continuous Infusions: . sodium chloride Stopped (12/10/18 1036)  . heparin 2,350 Units/hr (12/17/18 1048)  . piperacillin-tazobactam (ZOSYN)  IV 3.375 g (12/17/18 0913)   PRN Meds: sodium chloride, acetaminophen, bisacodyl, calcium carbonate, ipratropium, levalbuterol, metoprolol tartrate   Vital Signs    Vitals:   12/16/18 2300 12/16/18 2333 12/17/18 0454 12/17/18 1047  BP: 135/90  (!) 142/65 (!) 140/52  Pulse: 63  (!) 54 63  Resp: (!) 23   20  Temp:  98.7 F (37.1 C) 98.4 F (36.9 C) (!) 97.5 F (36.4 C)  TempSrc:  Oral Oral   SpO2: 96%  96% 97%  Weight:   80.4 kg   Height:        Intake/Output Summary (Last 24 hours) at 12/17/2018 1118 Last data filed at 12/17/2018 0600 Gross per 24 hour  Intake 407 ml  Output 120 ml  Net 287 ml   Filed Weights   12/15/18 0433 12/16/18 0333 12/17/18 0454  Weight: 80.3 kg 81.1 kg 80.4 kg    Telemetry    Atrial fib with controlled rate and PVCs  - Personally Reviewed  ECG    NA - Personally Reviewed  Physical Exam   GEN: No  acute distress.   Neck: No  JVD Cardiac: Irregular  RR, no murmurs, rubs, or gallops.  Respiratory: Clear   to auscultation bilaterally. GI: Soft, nontender, non-distended, normal bowel sounds  MS:  Mild left leg edema; No deformity. Neuro:     Left arm and leg weakness Psych: Oriented and appropriate    Labs    Chemistry Recent Labs  Lab 12/11/18 0345 12/12/18 0348 12/12/18 1918 12/13/18 0312 12/14/18 0413  NA 135 135  --  133* 135  K 3.9 4.0 4.3 4.9 4.1  CL 99 99  --  100 102  CO2 26 25  --  23 22  GLUCOSE 108* 95  --  95 100*  BUN 30* 24*  --  16 13  CREATININE 1.12 1.15  --  1.02 0.95  CALCIUM 8.3* 8.6*  --  8.5* 8.8*  PROT 6.7 7.3  --   --   --   ALBUMIN 2.0* 2.2*  --   --   --   AST 32 24  --   --   --   ALT 29 24  --   --   --   ALKPHOS 102 105  --   --   --  BILITOT 0.9 0.7  --   --   --   GFRNONAA >60 >60  --  >60 >60  GFRAA >60 >60  --  >60 >60  ANIONGAP 10 11  --  10 11     Hematology Recent Labs  Lab 12/14/18 0413 12/16/18 0727 12/17/18 0428  WBC 9.2 5.2 4.4  RBC 3.25* 3.23* 3.22*  HGB 9.9* 10.0* 10.0*  HCT 30.7* 30.5* 30.9*  MCV 94.5 94.4 96.0  MCH 30.5 31.0 31.1  MCHC 32.2 32.8 32.4  RDW 13.3 13.9 14.3  PLT 442* 479* 361    Cardiac EnzymesNo results for input(s): TROPONINI in the last 168 hours. No results for input(s): TROPIPOC in the last 168 hours.   BNPNo results for input(s): BNP, PROBNP in the last 168 hours.   DDimer No results for input(s): DDIMER in the last 168 hours.   Radiology    No results found.  Cardiac Studies   ECHO  12/07/18  1. Left ventricular ejection fraction, by visual estimation, is 20%. The left ventricle has severely decreased function. Mildly increased left ventricular size. There is mildly increased left ventricular hypertrophy. 2. Left ventricular diastolic Doppler parameters are indeterminate in the setting of rapid atrial fibrillation. 3. Global right ventricle has mildly reduced systolic function.The right ventricular size is normal. No increase  in right ventricular wall thickness. 4. Left atrial size was mild-moderately dilated. 5. Right atrial size was normal. 6. Trivial pericardial effusion is present. 7. The pericardial effusion is posterior to the left ventricle. 8. Mild aortic valve annular calcification. 9. The mitral valve is grossly normal. Mild mitral valve regurgitation. 10. The tricuspid valve is grossly normal. Tricuspid valve regurgitation mild-moderate. 11. The aortic valve is tricuspid Aortic valve regurgitation is mild to moderate by color flow Doppler. 12. The pulmonic valve was grossly normal. Pulmonic valve regurgitation is mild by color flow Doppler. 13. Moderately elevated pulmonary artery systolic pressure. 14. The inferior vena cava is normal in size with greater than 50% respiratory variability, suggesting right atrial pressure of 3 mmHg. 15. A device wire is visualized. 16. The tricuspid regurgitant velocity is 3.68 m/s, and with an assumed right atrial pressure of 3 mmHg, the estimated right ventricular systolic pressure is moderately  Patient Profile     68 y.o. male with nonischemic cardiomyopathy status post ICD,persistent atrial fibrillation, left lower extremity DVT status post IVC filter, recent hemorrhagic stroke, CKD III, admitted with persistent fever and respiratory failure thought to be due to pneumonia. Cardiology has been assisting with atrial fibrillation with rapid ventricular response.  Assessment & Plan    Atrial fibrillation:    Holding off on oral anticoagulation until clear from a neurosurgery standpoint.  Rate well controlled.  Continue current therapy.  Changed to Toprol XL yesterday.   We will follow as needed.   CHRONIC DIASTOLIC HF:   Back on Entresto.   GI:  Status post percutaneous drainage.     Hx DVT:  He will eventually need Eliquis.  See above.    For questions or updates, please contact [place] Please consult [email or web address] for contact info under  Cardiology/STEMI.   Signed, Minus Breeding, MD  12/17/2018, 11:18 AM

## 2018-12-17 NOTE — Progress Notes (Addendum)
Referring Physician(s): Dr Deland Pretty  Supervising Physician: Markus Daft  Patient Status:  [facility] - In-pt  Chief Complaint:  Perc chole drain placed 10/24  Subjective:  Cholecystitis Drain placed 10/24-- intact Feeling better Eating Reg diet  Allergies: Benadryl [diphenhydramine]  Medications: Prior to Admission medications   Medication Sig Start Date End Date Taking? Authorizing Provider  acetaminophen (TYLENOL) 325 MG tablet Take 2 tablets (650 mg total) by mouth every 4 (four) hours as needed for mild pain (or temp > 37.5 C (99.5 F)). 12/02/18   Angiulli, Lavon Paganini, PA-C  atorvastatin (LIPITOR) 20 MG tablet Take 1 tablet (20 mg total) by mouth daily at 6 PM. 11/13/18   Donzetta Starch, NP  carvedilol (COREG) 12.5 MG tablet Take 1 tablet (12.5 mg total) by mouth 2 (two) times daily with a meal. 12/02/18   Angiulli, Lavon Paganini, PA-C  ceFEPIme 2 g in sodium chloride 0.9 % 100 mL Inject 2 g into the vein every 8 (eight) hours. 12/02/18   Angiulli, Lavon Paganini, PA-C  ciprofloxacin (CILOXAN) 0.3 % ophthalmic solution Place 1 drop into the left eye 4 (four) times daily. Administer 1 drop, every 2 hours, while awake, for 2 days. Then 1 drop, every 4 hours, while awake, for the next 5 days. 12/02/18   Angiulli, Lavon Paganini, PA-C  divalproex (DEPAKOTE SPRINKLE) 125 MG capsule Take 1 capsule (125 mg total) by mouth every 12 (twelve) hours. 12/02/18   Angiulli, Lavon Paganini, PA-C  furosemide (LASIX) 40 MG tablet Take 1 tablet (40 mg total) by mouth daily. 11/06/18   Mullis, Kiersten P, DO  gabapentin (NEURONTIN) 300 MG capsule TAKE 1 CAPSULE(300 MG) BY MOUTH THREE TIMES DAILY Patient taking differently: Take 300 mg by mouth 3 (three) times daily.  10/10/18   [name], MD  lidocaine (LIDODERM) 5 % Place 1 patch onto the skin daily. Remove & Discard patch within 12 hours or as directed by MD 12/03/18   Angiulli, Lavon Paganini, PA-C  lidocaine (LMX) 4 % cream Apply topically 3 (three) times daily. 12/02/18    Angiulli, Lavon Paganini, PA-C  modafinil (PROVIGIL) 100 MG tablet Take 1 tablet (100 mg total) by mouth daily at 6 (six) AM. 12/03/18   Angiulli, Lavon Paganini, PA-C  potassium chloride SA (K-DUR) 20 MEQ tablet Take 1 tablet (20 mEq total) by mouth daily. 11/06/18   Mullis, Kiersten P, DO  sacubitril-valsartan (ENTRESTO) 24-26 MG Take 1 tablet by mouth 2 (two) times daily. 11/06/18   Mullis, Kiersten P, DO  spironolactone (ALDACTONE) 25 MG tablet Take 0.5 tablets (12.5 mg total) by mouth daily. 12/03/18   Angiulli, Lavon Paganini, PA-C  topiramate (TOPAMAX) 25 MG tablet Take 1 tablet (25 mg total) by mouth at bedtime. 12/02/18   Angiulli, Lavon Paganini, PA-C  traMADol (ULTRAM) 50 MG tablet Take 1 tablet (50 mg total) by mouth every 8 (eight) hours as needed for severe pain. 12/02/18   Angiulli, Lavon Paganini, PA-C     Vital Signs: BP (!) 142/65 (BP Location: Right Arm)   Pulse (!) 54   Temp 98.4 F (36.9 C) (Oral)   Resp (!) 23   Ht 5\' 7"  (1.702 m)   Wt 177 lb 4 oz (80.4 kg)   SpO2 96%   BMI 27.76 kg/m   Physical Exam Skin:    General: Skin is warm and dry.     Comments: Site is clean and dry NT no bleeding OP bile; 120 cc yesterday NGTD  Neurological:     Mental Status: He is oriented to person, place, and time.  Psychiatric:        Behavior: Behavior normal.     Imaging: Ir Perc Cholecystostomy  Result Date: 12/13/2018 INDICATION: 68 year old male with acute calculus cholecystitis. He is currently not an operative candidate and therefore presents for percutaneous cholecystostomy tube placement. EXAM: CHOLECYSTOSTOMY MEDICATIONS: Patient is currently receiving intravenous antibiotics on a schedule. No additional antibiotic prophylaxis was administered. ANESTHESIA/SEDATION: Moderate (conscious) sedation was employed during this procedure. A total of Versed 1.5 mg and Fentanyl 75 mcg was administered intravenously. Moderate Sedation Time: 10 minutes. The patient's level of consciousness and vital  signs were monitored continuously by radiology nursing throughout the procedure under my direct supervision. FLUOROSCOPY TIME:  Fluoroscopy Time: 0 minutes 48 seconds (5 mGy). COMPLICATIONS: None immediate. PROCEDURE: Informed written consent was obtained from the patient after a thorough discussion of the procedural risks, benefits and alternatives. All questions were addressed. Maximal Sterile Barrier Technique was utilized including caps, mask, sterile gowns, sterile gloves, sterile drape, hand hygiene and skin antiseptic. A timeout was performed prior to the initiation of the procedure. The right upper quadrant was interrogated with ultrasound. The gallbladder is filled with sludge, debris and small stones. The wall is thickened and striated. A suitable skin entry site was selected and marked. The overlying skin was sterilely prepped and draped in the standard fashion using chlorhexidine skin prep. Local anesthesia was attained by infiltration with 1% lidocaine. A small dermatotomy was made. Under real-time sonographic guidance, a 21 gauge Accustick needle was advanced along a short transhepatic course and into the gallbladder lumen. A wire was advanced in the gallbladder lumen. The needle was exchanged for the Accustick sheath which was advanced over the wire and into the gallbladder lumen. A gentle hand injection of contrast material confirmed that the sheath was indeed within the gallbladder lumen. A short Amplatz wire was then advanced and coiled in the gallbladder lumen. The Accustick sheath was removed. The skin tract was dilated to 10 [place]. A Cook 10.2 [place] all-purpose drainage catheter was advanced over the wire and formed. Aspiration yields thick black bile. A sample was sent for Gram stain and culture. The catheter was gently flushed with saline and connected to gravity bag drainage. The catheter was secured to the skin with 0 Prolene suture. The patient tolerated the procedure well. IMPRESSION:  Successful placement of a percutaneous transhepatic cholecystostomy tube for acute calculus cholecystitis in a poor operative candidate. PLAN: 1. Maintain tube to gravity bag drainage. 2. Follow-up with Interventional Radiology in 4 weeks for through the tube cholangiogram to assess patency of the cystic and common bile ducts. Electronically Signed   By: Jacqulynn Cadet M.D.   On: 12/13/2018 16:08    Labs:  CBC: Recent Labs    12/13/18 0312 12/14/18 0413 12/16/18 0727 12/17/18 0428  WBC 8.1 9.2 5.2 4.4  HGB 9.7* 9.9* 10.0* 10.0*  HCT 29.9* 30.7* 30.5* 30.9*  PLT 371 442* 479* 361    COAGS: Recent Labs    11/01/18 1814 11/03/18 0604 11/10/18 1753  11/11/18 0329 11/11/18 0953 12/02/18 2018 12/04/18 0333  INR  --   --  1.2   < > 1.2 1.3* 1.4* 1.4*  APTT 35 51* 31  --   --   --  31  --    < > = values in this interval not displayed.    BMP: Recent Labs    12/11/18 0345 12/12/18 0348 12/12/18  1918 12/13/18 0312 12/14/18 0413  NA 135 135  --  133* 135  K 3.9 4.0 4.3 4.9 4.1  CL 99 99  --  100 102  CO2 26 25  --  23 22  GLUCOSE 108* 95  --  95 100*  BUN 30* 24*  --  16 13  CALCIUM 8.3* 8.6*  --  8.5* 8.8*  CREATININE 1.12 1.15  --  1.02 0.95  GFRNONAA >60 >60  --  >60 >60  GFRAA >60 >60  --  >60 >60    LIVER FUNCTION TESTS: Recent Labs    12/02/18 1528 12/02/18 2209 12/11/18 0345 12/12/18 0348  BILITOT 0.8 0.8 0.9 0.7  AST 25 26 32 24  ALT 16 16 29 24   ALKPHOS 68 61 102 105  PROT 7.4 6.5 6.7 7.3  ALBUMIN 3.1* 2.7* 2.0* 2.2*    Assessment and Plan:  Perc chole drain placed 10/24 Afeb; wbc wnl Drain to remain 8 weeks-- follow with [facility] Pt will hear from [facility] scheduler for follow up date and time ~8 weeks follow up Follow up orders in place Pt should record OP of drain daily Flush drain 5 cc sterile saline daily (please be sure pt has Rx for flushes at DC)  Electronically Signed: Lavonia Drafts, PA-C 12/17/2018, 9:48 AM   I spent a  total of 15 Minutes at the the patient's bedside AND on the patient's hospital floor or unit, greater than 50% of which was counseling/coordinating care for perc chole drain

## 2018-12-17 NOTE — Progress Notes (Signed)
PROGRESS NOTE    Glenn [name]   [id number] DOB: [birth year] DOA: 12/02/2018  From CIR PCP: [name], MD   Hospital Summary  68 year old gentleman prior history of atrial fibrillation on Eliquis, ischemic cardiomyopathy with left ventricular ejection fraction of 15 to 20% s/p ICD and cardioversion on September 2020, stage III CKD hypertension, hyperlipidemia, recent hospitalization for right MCA subacute hemorrhagic CVA 11/10/18 till 12/02/18 and subsequent discharge to CIR is back with atrial fibrillation with RVR and sepsis secondary to aspiration pneumonia and acute cholecystitis, possible acute on chronic diastolic heart failure and extensive DVT with IVC placement. Currently patient is being followed by cardiology, surgery. For his acute cholecystitis in view of multiple medical problems patient is not a surgical candidate hence he underwent percutaneous cholecystostomy drain placement by IR on 12/13/2018.  He is currently on IV Zosyn . Patient was transferred to [facility] service on 12/10/2018.  A & P   Active Problems:   Rapid atrial fibrillation ([name])   Acute deep vein thrombosis (DVT) of both lower extremities ([name])   A. fib with RVR currently rate controlled and off Cardizem drip, on IV heparin without evidence of cranial rebleeding.  Cardiology on board and plan to switch to Eliquis p.o. once cleared by neurosurgery . Continue Toprol-XL 100 mg daily . Continue digoxin 0.25 mg daily . I have reached out to neurosurgery earlier today to see if patient is a candidate for Eliquis seeing as how he is tolerating heparin and had a improved CT brain several days ago however unfortunately neurosurgery did not get back to me.  I will reach back out tomorrow  Sepsis secondary to pneumonia/aspiration pneumonia and acute cholecystitis RUQ ultrasound and HIDA positive for acute cholecystitis.  Not a candidate for surgery per general surgery and underwent percutaneous cholecystotomy  drain placement by IR on 10/20.  Day 15 antibiotics, currently on Zosyn.  Afebrile without leukocytosis or signs of infection . Consider discontinuing antibiotics in a.m. Marland Kitchen Drains remain for 8 weeks . Patient will follow up outpatient with IR in 8 weeks, follow-up orders were placed and patient is to record output of drain daily . Flush drain 5 cc sterile saline daily, patient needs prescription for flushes at discharge  Chronic systolic heart failure status post ICD Echo 10/18 with EF 15 to 20%.  Restarted on Entresto and Lasix . Continue current management per cardiology  Extensive DVT of left common femoral vein, left popliteal vein, left proximal profunda vein, left posterior tibial veins, left peroneal veins and left gastrocnemius vein status post IVC filter on IV heparin . Plan to transition to Eliquis once cleared by neurosurgery as above  History of MCA hemorrhage stroke with residual left hemiparesis/dysphagia solicitation from [id number] discharge to CIR without new focal deficits at this time.  CT head 10/18 with resolution of intraventricular hemorrhage and expected evolution of basal ganglia hemorrhage and no new hemorrhage present with expected evolution of prominent anterior right MCA territory infarct and volume loss. . SNF placement at discharge . Follow-up with neurosurgery   DVT prophylaxis: Heparin   Code Status: Full Code  Diet: Dysphagia 2 Family Communication: No family at bedside Disposition Plan: Pending neurosurgery recommendations (will reach back out tomorrow as they did not answer today).  Will likely be medically stable for discharge tomorrow.  Consultants  . IR . Cardiology .   Antibiotics  Day 15/TBD of Zosyn for acute cholecystitis status post percutaneous drain      Subjective   Patient seen and  examined at bedside in no acute distress and resting comfortably. No acute events overnight. Denies any acute complaints at this time.  Tolerating diet  well.   Objective   Vitals:   12/17/18 0454 12/17/18 1047 12/17/18 1443 12/17/18 1816  BP: (!) 142/65 (!) 140/52 139/72 (!) 151/73  Pulse: (!) 54 63  78  Resp:  20 20 19   Temp: 98.4 F (36.9 C) (!) 97.5 F (36.4 C) 97.7 F (36.5 C) 97.6 F (36.4 C)  TempSrc: Oral  Oral Axillary  SpO2: 96% 97% 98% 99%  Weight: 80.4 kg     Height:        Intake/Output Summary (Last 24 hours) at 12/17/2018 1828 Last data filed at 12/17/2018 1817 Gross per 24 hour  Intake 545 ml  Output 350 ml  Net 195 ml   Filed Weights   12/15/18 0433 12/16/18 0333 12/17/18 0454  Weight: 80.3 kg 81.1 kg 80.4 kg    Examination:  Physical Exam Vitals signs and nursing note reviewed.  HENT:     Head: Normocephalic and atraumatic.     Nose: Nose normal.     Mouth/Throat:     Mouth: Mucous membranes are moist.  Eyes:     Extraocular Movements: Extraocular movements intact.  Neck:     Musculoskeletal: Normal range of motion. No neck rigidity.  Cardiovascular:     Rate and Rhythm: Normal rate and regular rhythm.  Pulmonary:     Effort: Pulmonary effort is normal.     Breath sounds: Normal breath sounds.  Abdominal:     General: Abdomen is flat.     Palpations: Abdomen is soft.  Musculoskeletal: Normal range of motion.        General: No swelling.  Neurological:     General: No focal deficit present.     Mental Status: He is alert. Mental status is at baseline.  Psychiatric:        Mood and Affect: Mood normal.        Behavior: Behavior normal.     Comments: Flat affect     Data Reviewed: I have personally reviewed following labs and imaging studies  CBC: Recent Labs  Lab 12/12/18 0348 12/13/18 0312 12/14/18 0413 12/16/18 0727 12/17/18 0428  WBC 8.7 8.1 9.2 5.2 4.4  HGB 8.9* 9.7* 9.9* 10.0* 10.0*  HCT 27.4* 29.9* 30.7* 30.5* 30.9*  MCV 95.1 94.0 94.5 94.4 96.0  PLT 359 371 442* 479* [id number]   Basic Metabolic Panel: Recent Labs  Lab 12/11/18 0345 12/12/18 0348 12/12/18 1918  12/13/18 0312 12/14/18 0413 12/17/18 1132  NA 135 135  --  133* 135 137  K 3.9 4.0 4.3 4.9 4.1 4.0  CL 99 99  --  100 102 103  CO2 26 25  --  23 22 25   GLUCOSE 108* 95  --  95 100* 113*  BUN 30* 24*  --  16 13 8   CREATININE 1.12 1.15  --  1.02 0.95 1.04  CALCIUM 8.3* 8.6*  --  8.5* 8.8* 8.8*  MG 2.3 2.4 2.3 2.3 2.1 1.9  PHOS  --   --   --   --  3.4  --    GFR: Estimated Creatinine Clearance: 71 mL/min (by C-G formula based on SCr of 1.04 mg/dL). Liver Function Tests: Recent Labs  Lab 12/11/18 0345 12/12/18 0348  AST 32 24  ALT 29 24  ALKPHOS 102 105  BILITOT 0.9 0.7  PROT 6.7 7.3  ALBUMIN 2.0* 2.2*  No results for input(s): LIPASE, AMYLASE in the last 168 hours. No results for input(s): AMMONIA in the last 168 hours. Coagulation Profile: No results for input(s): INR, PROTIME in the last 168 hours. Cardiac Enzymes: No results for input(s): CKTOTAL, CKMB, CKMBINDEX, TROPONINI in the last 168 hours. BNP (last 3 results) No results for input(s): PROBNP in the last 8760 hours. HbA1C: No results for input(s): HGBA1C in the last 72 hours. CBG: No results for input(s): GLUCAP in the last 168 hours. Lipid Profile: No results for input(s): CHOL, HDL, LDLCALC, TRIG, CHOLHDL, LDLDIRECT in the last 72 hours. Thyroid Function Tests: No results for input(s): TSH, T4TOTAL, FREET4, T3FREE, THYROIDAB in the last 72 hours. Anemia Panel: No results for input(s): VITAMINB12, FOLATE, FERRITIN, TIBC, IRON, RETICCTPCT in the last 72 hours. Sepsis Labs: No results for input(s): PROCALCITON, LATICACIDVEN in the last 168 hours.  Recent Results (from the past 240 hour(s))  Body fluid culture     Status: None   Collection Time: 12/13/18  1:29 PM   Specimen: BILE; Body Fluid  Result Value Ref Range Status   Specimen Description BILE  Final   Special Requests Normal  Final   Gram Stain   Final    RARE WBC PRESENT,BOTH PMN AND MONONUCLEAR NO ORGANISMS SEEN    Culture   Final    NO  GROWTH 3 DAYS Performed at [facility], [address], [place], [place] [address]    Report Status 12/16/2018 FINAL  Final         Radiology Studies: No results found.      Scheduled Meds: . atorvastatin  20 mg Oral q1800  . chlorhexidine  15 mL Mouth Rinse BID  . Chlorhexidine Gluconate Cloth  6 each Topical Daily  . diclofenac sodium  2 g Topical QID  . digoxin  0.25 mg Oral Daily  . feeding supplement (ENSURE ENLIVE)  237 mL Oral TID BM  . furosemide  40 mg Oral Daily  . gabapentin  300 mg Oral Q8H  . mouth rinse  15 mL Mouth Rinse q12n4p  . metoprolol succinate  100 mg Oral Daily  . multivitamin with minerals  1 tablet Oral Daily  . pantoprazole  40 mg Oral Daily  . saccharomyces boulardii  250 mg Oral BID  . sacubitril-valsartan  1 tablet Oral BID  . sodium chloride flush  5 mL Intracatheter Q8H   Continuous Infusions: . sodium chloride Stopped (12/10/18 1036)  . heparin 2,350 Units/hr (12/17/18 1048)  . piperacillin-tazobactam (ZOSYN)  IV 3.375 g (12/17/18 1802)     LOS: 15 days    Time spent: 21 minutes    Harold Hedge, DO [facility] Pager [phone] If 7PM-7AM, please contact night-coverage [email or web address] Password [facility] 12/17/2018, 6:28 PM

## 2018-12-18 ENCOUNTER — Inpatient Hospital Stay (HOSPITAL_COMMUNITY): Payer: Medicare HMO

## 2018-12-18 LAB — CBC
HCT: 29.5 % — ABNORMAL LOW (ref 39.0–52.0)
Hemoglobin: 9.5 g/dL — ABNORMAL LOW (ref 13.0–17.0)
MCH: 30.6 pg (ref 26.0–34.0)
MCHC: 32.2 g/dL (ref 30.0–36.0)
MCV: 95.2 fL (ref 80.0–100.0)
Platelets: 467 10*3/uL — ABNORMAL HIGH (ref 150–400)
RBC: 3.1 MIL/uL — ABNORMAL LOW (ref 4.22–5.81)
RDW: 14.3 % (ref 11.5–15.5)
WBC: 3.9 10*3/uL — ABNORMAL LOW (ref 4.0–10.5)
nRBC: 0 % (ref 0.0–0.2)

## 2018-12-18 LAB — HEPARIN LEVEL (UNFRACTIONATED): Heparin Unfractionated: 0.5 IU/mL (ref 0.30–0.70)

## 2018-12-18 LAB — BASIC METABOLIC PANEL
Anion gap: 9 (ref 5–15)
BUN: 6 mg/dL — ABNORMAL LOW (ref 8–23)
CO2: 22 mmol/L (ref 22–32)
Calcium: 8.8 mg/dL — ABNORMAL LOW (ref 8.9–10.3)
Chloride: 106 mmol/L (ref 98–111)
Creatinine, Ser: 0.93 mg/dL (ref 0.61–1.24)
GFR calc Af Amer: >60 mL/min (ref >60)
GFR calc non Af Amer: >60 mL/min (ref >60)
Glucose, Bld: 102 mg/dL — ABNORMAL HIGH (ref 70–99)
Potassium: 3.9 mmol/L (ref 3.5–5.1)
Sodium: 137 mmol/L (ref 135–145)

## 2018-12-18 NOTE — Progress Notes (Addendum)
PROGRESS NOTE    Glenn [name]   [id number] DOB: [birth year] DOA: 12/02/2018  From CIR PCP: [name], MD   Hospital Summary  68 year old gentleman prior history of atrial fibrillation on Eliquis, ischemic cardiomyopathy with left ventricular ejection fraction of 15 to 20% s/p ICD and cardioversion on September 2020, stage III CKD hypertension, hyperlipidemia, recent hospitalization for right MCA subacute hemorrhagic CVA 11/10/18 till 12/02/18 and subsequent discharge to CIR is back with atrial fibrillation with RVR and sepsis secondary to aspiration pneumonia and acute cholecystitis, possible acute on chronic diastolic heart failure and extensive DVT with IVC placement. Currently patient is being followed by cardiology, surgery. For his acute cholecystitis in view of multiple medical problems patient is not a surgical candidate hence he underwent percutaneous cholecystostomy drain placement by IR on 12/13/2018.  He is currently on IV Zosyn . Patient was transferred to [facility] service on 12/10/2018.  A & P   Active Problems:   Rapid atrial fibrillation ([name])   Acute deep vein thrombosis (DVT) of both lower extremities ([name])   A. fib with RVR currently rate controlled and off Cardizem drip, on IV heparin without evidence of cranial rebleeding.   . Regarding anticoagulation: I called neurosurgery today to discuss anticoagulation who mentioned neurology was the team who approved heparin and to discuss with neuro.  I discussed anticoagulation with neuro who recommended to get a repeat CT head and discuss anticoagulation with stroke neurology, once scan is obtained . CT head without contrast . Continue Toprol-XL 100 mg daily . Digoxin discontinued . Follow-up with cardiology  Sepsis secondary to pneumonia/aspiration pneumonia and acute cholecystitis resolved.  RUQ ultrasound and HIDA positive for acute cholecystitis.  Not a candidate for surgery per general surgery and underwent  percutaneous cholecystotomy drain placement by IR on 10/20.  Day 16 antibiotics, currently on Zosyn.  Afebrile without leukocytosis or signs of infection . Discontinue antibiotics . Drain remain for 8 weeks . Patient will follow up outpatient with IR in 8 weeks, follow-up orders were placed and patient is to record output of drain daily . Flush drain 5 cc sterile saline daily, patient needs prescription for flushes at discharge  Chronic systolic heart failure status post ICD Echo 10/18 with EF 15 to 20%.  Restarted on Entresto and Lasix . Continue current management per cardiology  Extensive DVT of left common femoral vein, left popliteal vein, left proximal profunda vein, left posterior tibial veins, left peroneal veins and left gastrocnemius vein status post IVC filter on IV heparin . Plan to transition to Eliquis once cleared by neuro as above  History of MCA hemorrhage stroke with residual left hemiparesis/dysphagia solicitation from [id number] discharge to CIR without new focal deficits at this time.  CT head 10/18 with resolution of intraventricular hemorrhage and expected evolution of basal ganglia hemorrhage and no new hemorrhage present with expected evolution of prominent anterior right MCA territory infarct and volume loss. . SNF placement at discharge . Follow-up with neuro as above   DVT prophylaxis: Heparin   Code Status: Full Code  Diet: Dysphagia 2 Family Communication: Discussed with son over the phone Disposition Plan: Pending neurosurgery recommendations (will reach back out tomorrow as they did not answer today).  Will likely be medically stable for discharge tomorrow.  Consultants  . IR . Cardiology  Antibiotics  Day 76 Zosyn for acute cholecystitis status post percutaneous drain, discontinue today.      Subjective   Patient seen and examined at bedside no acute distress.  Eating breakfast.  Tolerating diet.  No acute complaints at this time  Objective    Vitals:   12/17/18 2018 12/17/18 2345 12/18/18 0504 12/18/18 0745  BP:  (!) 134/54 132/61 106/83  Pulse: (!) 56 76 65 92  Resp:  20 (!) 21 (!) 21  Temp: 98.6 F (37 C) 98.6 F (37 C) 98.3 F (36.8 C) 98.1 F (36.7 C)  TempSrc: Oral Oral Oral Oral  SpO2: 99% 97% 100% 96%  Weight:   80.8 kg   Height:        Intake/Output Summary (Last 24 hours) at 12/18/2018 1000 Last data filed at 12/18/2018 0600 Gross per 24 hour  Intake 2889.07 ml  Output 855 ml  Net 2034.07 ml   Filed Weights   12/16/18 0333 12/17/18 0454 12/18/18 0504  Weight: 81.1 kg 80.4 kg 80.8 kg    Examination:  Physical Exam Vitals signs and nursing note reviewed.  Constitutional:      Appearance: Normal appearance.  HENT:     Head: Normocephalic and atraumatic.     Nose: Nose normal.     Mouth/Throat:     Mouth: Mucous membranes are moist.  Eyes:     Extraocular Movements: Extraocular movements intact.  Neck:     Musculoskeletal: Normal range of motion. No neck rigidity.  Cardiovascular:     Rate and Rhythm: Normal rate and regular rhythm.  Pulmonary:     Effort: Pulmonary effort is normal.     Breath sounds: Normal breath sounds.  Abdominal:     General: Abdomen is flat.     Palpations: Abdomen is soft.  Musculoskeletal: Normal range of motion.        General: No swelling.  Neurological:     Mental Status: He is alert. Mental status is at baseline.     Comments: Flat affect  Psychiatric:        Mood and Affect: Mood normal.        Behavior: Behavior normal.     Data Reviewed: I have personally reviewed following labs and imaging studies  CBC: Recent Labs  Lab 12/13/18 0312 12/14/18 0413 12/16/18 0727 12/17/18 0428 12/18/18 0409  WBC 8.1 9.2 5.2 4.4 3.9*  HGB 9.7* 9.9* 10.0* 10.0* 9.5*  HCT 29.9* 30.7* 30.5* 30.9* 29.5*  MCV 94.0 94.5 94.4 96.0 95.2  PLT 371 442* 479* 361 0000000*   Basic Metabolic Panel: Recent Labs  Lab 12/12/18 0348 12/12/18 1918 12/13/18 0312 12/14/18 0413  12/17/18 1132 12/18/18 0409  NA 135  --  133* 135 137 137  K 4.0 4.3 4.9 4.1 4.0 3.9  CL 99  --  100 102 103 106  CO2 25  --  23 22 25 22   GLUCOSE 95  --  95 100* 113* 102*  BUN 24*  --  16 13 8  6*  CREATININE 1.15  --  1.02 0.95 1.04 0.93  CALCIUM 8.6*  --  8.5* 8.8* 8.8* 8.8*  MG 2.4 2.3 2.3 2.1 1.9  --   PHOS  --   --   --  3.4  --   --    GFR: Estimated Creatinine Clearance: 79.6 mL/min (by C-G formula based on SCr of 0.93 mg/dL). Liver Function Tests: Recent Labs  Lab 12/12/18 0348  AST 24  ALT 24  ALKPHOS 105  BILITOT 0.7  PROT 7.3  ALBUMIN 2.2*   No results for input(s): LIPASE, AMYLASE in the last 168 hours. No results for input(s): AMMONIA in the last 168  hours. Coagulation Profile: No results for input(s): INR, PROTIME in the last 168 hours. Cardiac Enzymes: No results for input(s): CKTOTAL, CKMB, CKMBINDEX, TROPONINI in the last 168 hours. BNP (last 3 results) No results for input(s): PROBNP in the last 8760 hours. HbA1C: No results for input(s): HGBA1C in the last 72 hours. CBG: No results for input(s): GLUCAP in the last 168 hours. Lipid Profile: No results for input(s): CHOL, HDL, LDLCALC, TRIG, CHOLHDL, LDLDIRECT in the last 72 hours. Thyroid Function Tests: No results for input(s): TSH, T4TOTAL, FREET4, T3FREE, THYROIDAB in the last 72 hours. Anemia Panel: No results for input(s): VITAMINB12, FOLATE, FERRITIN, TIBC, IRON, RETICCTPCT in the last 72 hours. Sepsis Labs: No results for input(s): PROCALCITON, LATICACIDVEN in the last 168 hours.  Recent Results (from the past 240 hour(s))  Body fluid culture     Status: None   Collection Time: 12/13/18  1:29 PM   Specimen: BILE; Body Fluid  Result Value Ref Range Status   Specimen Description BILE  Final   Special Requests Normal  Final   Gram Stain   Final    RARE WBC PRESENT,BOTH PMN AND MONONUCLEAR NO ORGANISMS SEEN    Culture   Final    NO GROWTH 3 DAYS Performed at [facility],  [address] [place], [place] [address]    Report Status 12/16/2018 FINAL  Final         Radiology Studies: No results found.      Scheduled Meds: . atorvastatin  20 mg Oral q1800  . chlorhexidine  15 mL Mouth Rinse BID  . Chlorhexidine Gluconate Cloth  6 each Topical Daily  . diclofenac sodium  2 g Topical QID  . feeding supplement (ENSURE ENLIVE)  237 mL Oral TID BM  . furosemide  40 mg Oral Daily  . gabapentin  300 mg Oral Q8H  . mouth rinse  15 mL Mouth Rinse q12n4p  . metoprolol succinate  100 mg Oral Daily  . multivitamin with minerals  1 tablet Oral Daily  . pantoprazole  40 mg Oral Daily  . saccharomyces boulardii  250 mg Oral BID  . sacubitril-valsartan  1 tablet Oral BID  . sodium chloride flush  5 mL Intracatheter Q8H   Continuous Infusions: . sodium chloride Stopped (12/10/18 1036)  . heparin 2,350 Units/hr (12/18/18 0600)  . piperacillin-tazobactam (ZOSYN)  IV 3.375 g (12/18/18 0220)     LOS: 16 days    Time spent: 63 minutes    Harold Hedge, DO [facility] Pager [phone] If 7PM-7AM, please contact night-coverage [email or web address] Password [email or web address] 12/18/2018, 10:00 AM

## 2018-12-18 NOTE — Progress Notes (Signed)
  Speech Language Pathology Treatment: Dysphagia  Patient Details Name: Glenn [name] MRN: [id number] DOB: 1950-09-12 Today's Date: 12/18/2018 Time: 3552-1747 SLP Time Calculation (min) (ACUTE ONLY): 22 min  Assessment / Plan / Recommendation Clinical Impression  Patient seen for dysphagia tx today. He was finishing his breakfast tray (Dys 2, thin liquids) as I was entering room. Pt's voice was clear as well as his oral cavity. He reported having no difficulties with swallowing. Trials of graham cracker given to assess ability to chew and manipulate solids. He exhibited complete mastication and good oral containment. His nurse was present and was able to give his medications whole (single pill at a time) with water. He tolerated well with no coughing or clearing. Pt continues to be easily distracted and benefits from redirection during task such as medication administration. Recommend a diet upgrade to Regular diet with thin liquids. Medication to be given whole, one at a time with water. All goals have been met. ST to sign off at this time.    HPI HPI: 68 year old male with past medical history significant for hypertension, hyperlipidemia, A. Fib previously on eliquis, HFrEF with EF 15-20% s/p ICD and cardioversion 10/29/2018, Hep C, CKD stage III, who admitted on 9/21 with left hemiparessis found to have large right MCA subacute hemorrhage. Additionally found to have acute nonischemic cardiomyopathy with EF 15-20% s/p ICD placement and cardioversion 9/9 for Afib. Pt with CIR admission from 9/25 to 10/13. Discharge back to acute with fever, A. fib with RVR, abdominal pain, respiratory distress with diagnosis of DVT. Pt awaiting IVC. Chest x-ray (12/04/18) concerning for increased opacity at the RIGHT lung base consistent with pleural effusion and atelectasis or infiltrate. Pt currently NPO d/t concerns of aspiration in setting of continued cognitive deficits and respiratory distress.       SLP  Plan  All goals met       Recommendations  Diet recommendations: Regular;Thin liquid Liquids provided via: Cup;Straw Medication Administration: Whole meds with liquid Supervision: Patient able to self feed;Intermittent supervision to cue for compensatory strategies Compensations: Minimize environmental distractions;Slow rate Postural Changes and/or Swallow Maneuvers: Seated upright 90 degrees                Plan: All goals met       GO                Charlynne Cousins Jalyric Kaestner, MA, CCC-SLP 12/18/2018 10:45 AM

## 2018-12-18 NOTE — Progress Notes (Signed)
Physical Therapy Treatment Patient Details Name: Glenn [name] MRN: [id number] DOB: 1950/11/25 Today's Date: 12/18/2018    History of Present Illness Pt is a 68 y/o male admitted from CIR secondary to a fib with RVR. Also found to have DVT on LLE and is s/p IVC filter placement. Pt also developed acute cholecystitis and had drain placed in IR on 10/25. 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 PMH includes a fib, CHF, hep C, HTN, and s/p ICD.     PT Comments    Pt continues to make slow, steady progress. Continue to recommend SNF for continued rehab.    Follow Up Recommendations  SNF;Supervision/Assistance - 24 hour     Equipment Recommendations  Other (comment)(TBD)    Recommendations for Other Services       Precautions / Restrictions Precautions Precautions: Fall Precaution Comments: L hemi Restrictions Weight Bearing Restrictions: No    Mobility  Bed Mobility Overal bed mobility: Needs Assistance Bed Mobility: Supine to Sit     Supine to sit: Mod assist;HOB elevated     General bed mobility comments: Assist to bring LLE off of bed, elevate trunk into sitting and bring hips to EOB.  Transfers Overall transfer level: Needs assistance Equipment used: Rolling walker (2 wheeled);Ambulation equipment used Transfers: Sit to/from Stand Sit to Stand: Mod assist;From elevated surface         General transfer comment: Assist to bring hips up and for balance. Pt with difficulty extending hip, knee and trunk on lt. Assist to place LUE and to keep hand on walker/stedy. Used stedy for bed to chair. Stood from chair with wallker.  Ambulation/Gait                 Stairs             Wheelchair Mobility    Modified Rankin (Stroke Patients Only) Modified Rankin (Stroke Patients Only) Pre-Morbid Rankin Score: No symptoms Modified Rankin: Severe disability     Balance Overall balance assessment: Needs assistance Sitting-balance support: Feet  supported;Single extremity supported Sitting balance-Leahy Scale: Poor Sitting balance - Comments: UE support to maintain sitting   Standing balance support: During functional activity;Single extremity supported Standing balance-Leahy Scale: Poor Standing balance comment: Walker or stedy and mod assist to maintain static standing                            Cognition Arousal/Alertness: Awake/alert Behavior During Therapy: WFL for tasks assessed/performed Overall Cognitive Status: No family/caregiver present to determine baseline cognitive functioning Area of Impairment: Attention;Memory;Following commands;Safety/judgement;Problem solving                   Current Attention Level: Sustained Memory: Decreased recall of precautions;Decreased short-term memory Following Commands: Follows one step commands consistently Safety/Judgement: Decreased awareness of safety;Decreased awareness of deficits   Problem Solving: Difficulty sequencing;Requires verbal cues;Requires tactile cues General Comments: Pt with recent CVA.      Exercises      General Comments        Pertinent Vitals/Pain Pain Assessment: No/denies pain    Home Living                      Prior Function            PT Goals (current goals can now be found in the care plan section) Progress towards PT goals: Progressing toward goals    Frequency  Min 2X/week      PT Plan Current plan remains appropriate    Co-evaluation              AM-PAC PT "6 Clicks" Mobility   Outcome Measure  Help needed turning from your back to your side while in a flat bed without using bedrails?: A Lot Help needed moving from lying on your back to sitting on the side of a flat bed without using bedrails?: A Lot Help needed moving to and from a bed to a chair (including a wheelchair)?: Total Help needed standing up from a chair using your arms (e.g., wheelchair or bedside chair)?: A Lot Help  needed to walk in hospital room?: Total Help needed climbing 3-5 steps with a railing? : Total 6 Click Score: 9    End of Session Equipment Utilized During Treatment: Gait belt Activity Tolerance: Patient tolerated treatment well(high HR) Patient left: in chair;with call bell/phone within reach;with chair alarm set Nurse Communication: Mobility status;Need for lift equipment PT Visit Diagnosis: Other abnormalities of gait and mobility (R26.89);Hemiplegia and hemiparesis;Difficulty in walking, not elsewhere classified (R26.2) Hemiplegia - Right/Left: Left Hemiplegia - dominant/non-dominant: Non-dominant Hemiplegia - caused by: Cerebral infarction;Nontraumatic intracerebral hemorrhage     Time: 1223-1256 PT Time Calculation (min) (ACUTE ONLY): 33 min  Charges:  $Therapeutic Activity: 23-37 mins                     [place] Pager [phone] Office [place] 12/18/2018, 2:05 PM

## 2018-12-18 NOTE — Progress Notes (Addendum)
Physical Therapy Treatment Patient Details Name: Glenn [name] MRN: [id number] DOB: Aug 18, 1950 Today's Date: 12/18/2018    History of Present Illness Pt is a 68 y/o male admitted from CIR secondary to a fib with RVR. Also found to have DVT on LLE and is s/p IVC filter placement. Pt also developed acute cholecystitis and had drain placed in IR on 10/25. 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 PMH includes a fib, CHF, hep C, HTN, and s/p ICD.     PT Comments    Pt with legs off side of recliner and hips scooted down toward foot rest. Pt unaware of safety risk. Assisted back to bed. Continue to recommend SNF.    Follow Up Recommendations  SNF;Supervision/Assistance - 24 hour     Equipment Recommendations  Other (comment)(TBD)    Recommendations for Other Services       Precautions / Restrictions Precautions Precautions: Fall Precaution Comments: L hemi Restrictions Weight Bearing Restrictions: No    Mobility  Bed Mobility Overal bed mobility: Needs Assistance Bed Mobility: Sit to Supine     Sit to supine: Min assist   General bed mobility comments: Assist to bring LLE back up into bed  Transfers Overall transfer level: Needs assistance Equipment used: Ambulation equipment used Transfers: Sit to/from Stand Sit to Stand: Mod assist;+2 physical assistance         General transfer comment: Assist to bring hips up and for balance. Tactile cues and manual facilitation for hip/trunk extension especially on the left. Pt with left lateral lean.  Used Stedy for chair to bsc to bed  Ambulation/Gait                 Stairs             Wheelchair Mobility    Modified Rankin (Stroke Patients Only) Modified Rankin (Stroke Patients Only) Pre-Morbid Rankin Score: No symptoms Modified Rankin: Severe disability     Balance Overall balance assessment: Needs assistance Sitting-balance support: Feet supported;Single extremity supported Sitting  balance-Leahy Scale: Poor Sitting balance - Comments: UE support to maintain sitting   Standing balance support: During functional activity;Single extremity supported Standing balance-Leahy Scale: Poor Standing balance comment: Stedy and mod assist for static standing                            Cognition Arousal/Alertness: Awake/alert Behavior During Therapy: WFL for tasks assessed/performed Overall Cognitive Status: No family/caregiver present to determine baseline cognitive functioning Area of Impairment: Attention;Memory;Following commands;Safety/judgement;Problem solving                   Current Attention Level: Sustained Memory: Decreased recall of precautions;Decreased short-term memory Following Commands: Follows one step commands consistently Safety/Judgement: Decreased awareness of safety;Decreased awareness of deficits   Problem Solving: Difficulty sequencing;Requires verbal cues;Requires tactile cues General Comments: Pt with recent CVA.      Exercises      General Comments        Pertinent Vitals/Pain Pain Assessment: No/denies pain    Home Living                      Prior Function            PT Goals (current goals can now be found in the care plan section) Progress towards PT goals: Progressing toward goals    Frequency    Min 2X/week      PT  Plan Current plan remains appropriate    Co-evaluation              AM-PAC PT "6 Clicks" Mobility   Outcome Measure  Help needed turning from your back to your side while in a flat bed without using bedrails?: A Lot Help needed moving from lying on your back to sitting on the side of a flat bed without using bedrails?: A Lot Help needed moving to and from a bed to a chair (including a wheelchair)?: Total Help needed standing up from a chair using your arms (e.g., wheelchair or bedside chair)?: A Lot Help needed to walk in hospital room?: Total Help needed climbing 3-5  steps with a railing? : Total 6 Click Score: 9    End of Session Equipment Utilized During Treatment: Gait belt Activity Tolerance: Patient tolerated treatment well Patient left: with call bell/phone within reach;in bed;with bed alarm set Nurse Communication: Mobility status;Need for lift equipment PT Visit Diagnosis: Other abnormalities of gait and mobility (R26.89);Hemiplegia and hemiparesis;Difficulty in walking, not elsewhere classified (R26.2) Hemiplegia - Right/Left: Left Hemiplegia - dominant/non-dominant: Non-dominant Hemiplegia - caused by: Cerebral infarction;Nontraumatic intracerebral hemorrhage     Time: [id number] PT Time Calculation (min) (ACUTE ONLY): 22 min  Charges:  $Therapeutic Activity: 8-22 mins                     [place] Pager [phone] Office [place] 12/18/2018, 4:40 PM

## 2018-12-18 NOTE — Progress Notes (Signed)
Progress Note  Patient Name: Glenn [name] Date of Encounter: 12/18/2018  Primary Cardiologist:   Virl Axe, MD   Subjective   No cardiac complaints.  Denies SOB or chest pain.   Inpatient Medications    Scheduled Meds: . atorvastatin  20 mg Oral q1800  . chlorhexidine  15 mL Mouth Rinse BID  . Chlorhexidine Gluconate Cloth  6 each Topical Daily  . diclofenac sodium  2 g Topical QID  . digoxin  0.25 mg Oral Daily  . feeding supplement (ENSURE ENLIVE)  237 mL Oral TID BM  . furosemide  40 mg Oral Daily  . gabapentin  300 mg Oral Q8H  . mouth rinse  15 mL Mouth Rinse q12n4p  . metoprolol succinate  100 mg Oral Daily  . multivitamin with minerals  1 tablet Oral Daily  . pantoprazole  40 mg Oral Daily  . saccharomyces boulardii  250 mg Oral BID  . sacubitril-valsartan  1 tablet Oral BID  . sodium chloride flush  5 mL Intracatheter Q8H   Continuous Infusions: . sodium chloride Stopped (12/10/18 1036)  . heparin 2,350 Units/hr (12/18/18 0600)  . piperacillin-tazobactam (ZOSYN)  IV 3.375 g (12/18/18 0220)   PRN Meds: sodium chloride, acetaminophen, bisacodyl, calcium carbonate, ipratropium, levalbuterol, metoprolol tartrate   Vital Signs    Vitals:   12/17/18 2004 12/17/18 2018 12/17/18 2345 12/18/18 0504  BP:   (!) 134/54 132/61  Pulse: 78 (!) 56 76 65  Resp: (!) 23  20 (!) 21  Temp:  98.6 F (37 C) 98.6 F (37 C) 98.3 F (36.8 C)  TempSrc:  Oral Oral Oral  SpO2: 96% 99% 97% 100%  Weight:    80.8 kg  Height:        Intake/Output Summary (Last 24 hours) at 12/18/2018 0737 Last data filed at 12/18/2018 0600 Gross per 24 hour  Intake 2889.07 ml  Output 855 ml  Net 2034.07 ml   Filed Weights   12/16/18 0333 12/17/18 0454 12/18/18 0504  Weight: 81.1 kg 80.4 kg 80.8 kg    Telemetry    Atrial fib.  PVCs, trigeminy  - Personally Reviewed  ECG    NA - Personally Reviewed  Physical Exam   GEN: No  acute distress.   Neck: No  JVD Cardiac:  Irregular RR, no murmurs, rubs, or gallops.  Respiratory: Clear   to auscultation bilaterally. GI: Soft, nontender, non-distended, normal bowel sounds  MS:  No edema; No deformity. Neuro:   Hemiparesis Psych: Oriented and appropriate     Labs    Chemistry Recent Labs  Lab 12/12/18 0348  12/14/18 0413 12/17/18 1132 12/18/18 0409  NA 135   < > 135 137 137  K 4.0   < > 4.1 4.0 3.9  CL 99   < > 102 103 106  CO2 25   < > 22 25 22   GLUCOSE 95   < > 100* 113* 102*  BUN 24*   < > 13 8 6*  CREATININE 1.15   < > 0.95 1.04 0.93  CALCIUM 8.6*   < > 8.8* 8.8* 8.8*  PROT 7.3  --   --   --   --   ALBUMIN 2.2*  --   --   --   --   AST 24  --   --   --   --   ALT 24  --   --   --   --   ALKPHOS 105  --   --   --   --  BILITOT 0.7  --   --   --   --   GFRNONAA >60   < > >60 >60 >60  GFRAA >60   < > >60 >60 >60  ANIONGAP 11   < > 11 9 9    < > = values in this interval not displayed.     Hematology Recent Labs  Lab 12/16/18 0727 12/17/18 0428 12/18/18 0409  WBC 5.2 4.4 3.9*  RBC 3.23* 3.22* 3.10*  HGB 10.0* 10.0* 9.5*  HCT 30.5* 30.9* 29.5*  MCV 94.4 96.0 95.2  MCH 31.0 31.1 30.6  MCHC 32.8 32.4 32.2  RDW 13.9 14.3 14.3  PLT 479* 361 467*    Cardiac EnzymesNo results for input(s): TROPONINI in the last 168 hours. No results for input(s): TROPIPOC in the last 168 hours.   BNPNo results for input(s): BNP, PROBNP in the last 168 hours.   DDimer No results for input(s): DDIMER in the last 168 hours.   Radiology    No results found.  Cardiac Studies   ECHO  12/07/18  1. Left ventricular ejection fraction, by visual estimation, is 20%. The left ventricle has severely decreased function. Mildly increased left ventricular size. There is mildly increased left ventricular hypertrophy. 2. Left ventricular diastolic Doppler parameters are indeterminate in the setting of rapid atrial fibrillation. 3. Global right ventricle has mildly reduced systolic function.The right  ventricular size is normal. No increase in right ventricular wall thickness. 4. Left atrial size was mild-moderately dilated. 5. Right atrial size was normal. 6. Trivial pericardial effusion is present. 7. The pericardial effusion is posterior to the left ventricle. 8. Mild aortic valve annular calcification. 9. The mitral valve is grossly normal. Mild mitral valve regurgitation. 10. The tricuspid valve is grossly normal. Tricuspid valve regurgitation mild-moderate. 11. The aortic valve is tricuspid Aortic valve regurgitation is mild to moderate by color flow Doppler. 12. The pulmonic valve was grossly normal. Pulmonic valve regurgitation is mild by color flow Doppler. 13. Moderately elevated pulmonary artery systolic pressure. 14. The inferior vena cava is normal in size with greater than 50% respiratory variability, suggesting right atrial pressure of 3 mmHg. 15. A device wire is visualized. 16. The tricuspid regurgitant velocity is 3.68 m/s, and with an assumed right atrial pressure of 3 mmHg, the estimated right ventricular systolic pressure is moderately  Patient Profile     68 y.o. male with nonischemic cardiomyopathy status post ICD,persistent atrial fibrillation, left lower extremity DVT status post IVC filter, recent hemorrhagic stroke, CKD III, admitted with persistent fever and respiratory failure thought to be due to pneumonia. Cardiology has been assisting with atrial fibrillation with rapid ventricular response.  Assessment & Plan    Atrial fibrillation:    Holding off on oral anticoagulation until clear from a neurosurgery standpoint.  Rate well controlled.  Awaiting response from neurosurgery concerning restarting DOAC and discontinuing Heparin.   I am going to discontinue the digoxin. Rate control with beta blocker. I will increase this in the AM if HR increased.   CHRONIC DIASTOLIC HF:   Back on Entresto.   Seems to be euvolemic.    GI:  Status post percutaneous  drainage.   This is apparently to remain for 8 weeks total.    Hx DVT:  Resume Eliquis.    For questions or updates, please contact [place] Please consult [email or web address] for contact info under Cardiology/STEMI.   Signed, Minus Breeding, MD  12/18/2018, 7:37 AM

## 2018-12-18 NOTE — Progress Notes (Signed)
ANTICOAGULATION CONSULT NOTE - Follow Up Consult   Pharmacy Consult forHeparin Indication: atrial fibrillation(CHADS2VASc= 5), extensive DVTs/p IVC filter  Allergies  Allergen Reactions  . Benadryl [Diphenhydramine] Palpitations    Patient Measurements: Height: 5\' 7"  (170.2 cm) Weight: 178 lb 2.1 oz (80.8 kg) IBW/kg (Calculated) : 66.1 Heparin Dosing Weight: 85 kg  Vital Signs: Temp: 98.1 F (36.7 C) (10/29 0745) Temp Source: Oral (10/29 0745) BP: 106/83 (10/29 0745) Pulse Rate: 92 (10/29 0745)  Labs: Recent Labs    12/16/18 0727 12/17/18 0428 12/17/18 1132 12/18/18 0409  HGB 10.0* 10.0*  --  9.5*  HCT 30.5* 30.9*  --  29.5*  PLT 479* 361  --  467*  HEPARINUNFRC 0.64 0.29*  --  0.50  CREATININE  --   --  1.04 0.93    Estimated Creatinine Clearance: 79.6 mL/min (by C-G formula based on SCr of 0.93 mg/dL).  Assessment: 29 yoM with rapid Afib(CHADS2VASc= 5)and LLEDVT s/p IVC filter, withhx recent hemorrhagic stroke. Pharmacy cleared to start IV heparin (low goal no bolus) by CCM and neurology with stable head CT on 10/19.Plan to transition to apixaban once cleared by neurosurgery.  -heparin level= 0.5  Goal of Therapy: Heparin level 0.3-0.5units/ml Monitor platelets by anticoagulation protocol: Yes  Plan: -No heparin changes needed -Daily heparin level and CBC  Hildred Laser, PharmD Clinical Pharmacist **Pharmacist phone directory can now be found on [email or web address] ([email or web address]).  Listed under [place].

## 2018-12-19 LAB — BASIC METABOLIC PANEL
Anion gap: 11 (ref 5–15)
BUN: <5 mg/dL — ABNORMAL LOW (ref 8–23)
CO2: 24 mmol/L (ref 22–32)
Calcium: 9 mg/dL (ref 8.9–10.3)
Chloride: 103 mmol/L (ref 98–111)
Creatinine, Ser: 0.96 mg/dL (ref 0.61–1.24)
GFR calc Af Amer: >60 mL/min (ref >60)
GFR calc non Af Amer: >60 mL/min (ref >60)
Glucose, Bld: 97 mg/dL (ref 70–99)
Potassium: 3.7 mmol/L (ref 3.5–5.1)
Sodium: 138 mmol/L (ref 135–145)

## 2018-12-19 LAB — CBC
HCT: 31.5 % — ABNORMAL LOW (ref 39.0–52.0)
Hemoglobin: 10 g/dL — ABNORMAL LOW (ref 13.0–17.0)
MCH: 30.4 pg (ref 26.0–34.0)
MCHC: 31.7 g/dL (ref 30.0–36.0)
MCV: 95.7 fL (ref 80.0–100.0)
Platelets: 454 10*3/uL — ABNORMAL HIGH (ref 150–400)
RBC: 3.29 MIL/uL — ABNORMAL LOW (ref 4.22–5.81)
RDW: 14.3 % (ref 11.5–15.5)
WBC: 4.4 10*3/uL (ref 4.0–10.5)
nRBC: 0 % (ref 0.0–0.2)

## 2018-12-19 LAB — HEPARIN LEVEL (UNFRACTIONATED)
Heparin Unfractionated: 1.09 IU/mL — ABNORMAL HIGH (ref 0.30–0.70)
Heparin Unfractionated: 0.31 IU/mL (ref 0.30–0.70)

## 2018-12-19 MED ORDER — SACUBITRIL-VALSARTAN 49-51 MG PO TABS
1.0000 | ORAL_TABLET | Freq: Two times a day (BID) | ORAL | Status: DC
  Administered 2018-12-19 – 2018-12-23 (×9): 1 via ORAL
  Filled 2018-12-19 (×11): qty 1

## 2018-12-19 MED ORDER — DABIGATRAN ETEXILATE MESYLATE 150 MG PO CAPS
150.0000 mg | ORAL_CAPSULE | Freq: Two times a day (BID) | ORAL | Status: DC
  Administered 2018-12-19 – 2018-12-23 (×9): 150 mg via ORAL
  Filled 2018-12-19 (×12): qty 1

## 2018-12-19 NOTE — Discharge Instructions (Signed)
Information on my medicine - [name] (dabigatran)  This medication education was reviewed with me or my healthcare representative as part of my discharge preparation.   Why was [name] prescribed for you? [name] was prescribed to treat blood clots that may have been found in the veins of your legs (deep vein thrombosis) or in your lungs (pulmonary embolism) and to reduce the risk of them occurring again.  [name] will take the place of the injectable anticoagulant medication you have been receiving for this condition for the last 5-10 days.  What do you Need to know about [name]? Take your [name] TWICE DAILY - one capsule in the morning and one tablet in the evening with or without food.  It would be best to take the doses about the same time each day.  The capsules should not be broken, chewed or opened - they must be swallowed whole.  Do not store [name] in other medication containers - once the bottle is opened the [name] should be used within FOUR months; throw away any capsules that havent been by that time.  Take [name] exactly as prescribed by your doctor.  DO NOT stop taking [name] without talking to the doctor who prescribed the medication.  Refill your prescription before you run out.  After discharge, you should have regular check-up appointments with your healthcare provider that is prescribing your [name].  In the future your dose may need to be changed if your kidney function or weight changes by a significant amount.  What do you do if you miss a dose? If you miss a dose, take it as soon as you remember on the same day.  If your next dose is less than 6 hours away, skip the missed dose.  Do not take two doses of [name] at the same time.  Important Safety Information A possible side effect of [name] is bleeding. You should call your healthcare provider right away if you experience any of the following: ? Bleeding from an injury or your nose that does not  stop. ? Unusual colored urine (red or dark brown) or unusual colored stools (red or black). ? Unusual bruising for unknown reasons. ? A serious fall or if you hit your head (even if there is no bleeding).  Some medicines may interact with [name] and might increase your risk of bleeding or clotting while on [name]. To help avoid this, consult your healthcare provider or pharmacist prior to using any new prescription or non-prescription medications, including herbals, vitamins, non-steroidal anti-inflammatory drugs (NSAIDs) and supplements.  This website has more information on [name] (dabigatran): www.RuleEnforcement.cz.

## 2018-12-19 NOTE — Progress Notes (Signed)
ANTICOAGULATION CONSULT NOTE - Follow Up Consult   Pharmacy Consult forHeparin Indication: atrial fibrillation(CHADS2VASc= 5), extensive DVTs/p IVC filter  Allergies  Allergen Reactions  . Benadryl [Diphenhydramine] Palpitations    Patient Measurements: Height: 5\' 7"  (170.2 cm) Weight: 178 lb 2.1 oz (80.8 kg) IBW/kg (Calculated) : 66.1 Heparin Dosing Weight: 85 kg  Vital Signs: Temp: 98.4 F (36.9 C) (10/29 2031) Temp Source: Oral (10/29 2031) BP: 122/58 (10/29 2031) Pulse Rate: 52 (10/29 2031)  Labs: Recent Labs    12/17/18 0428 12/17/18 1132 12/18/18 0409 12/19/18 0339  HGB 10.0*  --  9.5* 10.0*  HCT 30.9*  --  29.5* 31.5*  PLT 361  --  467* 454*  HEPARINUNFRC 0.29*  --  0.50 1.09*  CREATININE  --  1.04 0.93  --     Estimated Creatinine Clearance: 79.6 mL/min (by C-G formula based on SCr of 0.93 mg/dL).  Assessment: 47 yoM with rapid Afib(CHADS2VASc= 5)and LLEDVT s/p IVC filter, withhx recent hemorrhagic stroke. Pharmacy cleared to start IV heparin (low goal no bolus) by CCM and neurology with stable head CT on 10/19.Plan to transition to apixaban once cleared by neurosurgery.  -heparin level now 1.09.  RN reports was drawn in R hand, heparin infusing in R AC.  She also states pt was not bending arm as much last night. Both could explain why level was higher than expected  Goal of Therapy: Heparin level 0.3-0.5units/ml Monitor platelets by anticoagulation protocol: Yes  Plan: -Decrease heparin slightly to 2200 units/hr Check heparin level later today -Daily heparin level and CBC  Excell Seltzer, PharmD Clinical Pharmacist

## 2018-12-19 NOTE — Progress Notes (Signed)
Occupational Therapy Treatment Patient Details Name: Glenn [name] MRN: [id number] DOB: 04/19/1950 Today's Date: 12/19/2018    History of present illness Pt is a 68 y/o male admitted from CIR secondary to a fib with RVR. Also found to have DVT on LLE and is s/p IVC filter placement. Pt also developed acute cholecystitis and had drain placed in IR on 10/25. 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 PMH includes a fib, CHF, hep C, HTN, and s/p ICD.    OT comments  Pt making steady progress towards OT goals this session. Pt MIN A +2 sit>stand with use of stedy for simulated toilet transfer from EOB>recliner. Pt can only tolerate standing for ~ 10 seconds before fatiguing. Pt reports he uses self ROM to mobility LUE but would benefit from LUE HEP. Pt sit>stand x2 progressing to close min guard + 2 assist for safety. DC plan remains appropriate, will continue to follow acutely per POC.    Follow Up Recommendations  Supervision/Assistance - 24 hour;SNF    Equipment Recommendations  Other (comment)(tbd to next venue of care)    Recommendations for Other Services      Precautions / Restrictions Precautions Precautions: Fall Precaution Comments: L hemi Restrictions Weight Bearing Restrictions: No       Mobility Bed Mobility Overal bed mobility: Needs Assistance Bed Mobility: Sit to Supine       Sit to supine: Mod assist;+2 for safety/equipment   General bed mobility comments: MOD A to advance LLE to EOB; cues for sequencing of task as pt easily distracted  Transfers Overall transfer level: Needs assistance Equipment used: Ambulation equipment used Transfers: Sit to/from Stand   Stand pivot transfers: Min assist;+2 safety/equipment       General transfer comment: pt requires assist to place LUE onto bar; able to power up with MIN A +2; pt can only tolerate standing ~ 10 secs before fatiguing    Balance Overall balance assessment: Needs assistance Sitting-balance  support: Feet supported;Single extremity supported Sitting balance-Leahy Scale: Poor                                     ADL either performed or assessed with clinical judgement   ADL Overall ADL's : Needs assistance/impaired         Upper Body Bathing: Supervision/ safety;Set up;Sitting   Lower Body Bathing: Supervison/ safety;Set up;Sit to/from stand Lower Body Bathing Details (indicate cue type and reason): pt reports being wet after urinal spilled in bed, pt able wash thighs and inbetween legs from recliner with RUE with supervision/ set-up Upper Body Dressing : Minimal assistance;Sitting Upper Body Dressing Details (indicate cue type and reason): hospital gown Lower Body Dressing: Supervision/safety;Sitting/lateral leans Lower Body Dressing Details (indicate cue type and reason): able to access feet from recliner Toilet Transfer: +2 for safety/equipment;Minimal assistance Toilet Transfer Details (indicate cue type and reason): simulated to recliner with stedy; can't maintain standing balance for long ~ 10 seconds         Functional mobility during ADLs: Minimal assistance;+2 for safety/equipment(stedy to chair) General ADL Comments: limited standing tolerance, set- up for LB bathing from recliner, limited use of L UE; demos some self- ROM     Vision Baseline Vision/History: No visual deficits     Perception     Praxis      Cognition Arousal/Alertness: Awake/alert Behavior During Therapy: WFL for tasks assessed/performed;Flat affect Overall Cognitive  Status: No family/caregiver present to determine baseline cognitive functioning Area of Impairment: Attention;Memory;Following commands;Safety/judgement;Problem solving                   Current Attention Level: Sustained Memory: Decreased recall of precautions;Decreased short-term memory Following Commands: Follows multi-step commands inconsistently;Follows one step commands with increased  time Safety/Judgement: Decreased awareness of safety;Decreased awareness of deficits   Problem Solving: Difficulty sequencing;Requires verbal cues;Requires tactile cues General Comments: requires cues to redirect pt to task, pt fixated on finding phone charger        Exercises     Shoulder Instructions       General Comments      Pertinent Vitals/ Pain       Pain Assessment: No/denies pain  Home Living                                          Prior Functioning/Environment              Frequency  Min 3X/week        Progress Toward Goals  OT Goals(current goals can now be found in the care plan section)  Progress towards OT goals: Progressing toward goals  Acute Rehab OT Goals Patient Stated Goal: to use LUE more OT Goal Formulation: With patient Time For Goal Achievement: 12/30/18 Potential to Achieve Goals: Good  Plan Discharge plan remains appropriate    Co-evaluation                 AM-PAC OT "6 Clicks" Daily Activity     Outcome Measure   Help from another person eating meals?: A Little Help from another person taking care of personal grooming?: A Little Help from another person toileting, which includes using toliet, bedpan, or urinal?: A Lot Help from another person bathing (including washing, rinsing, drying)?: A Lot Help from another person to put on and taking off regular upper body clothing?: A Little Help from another person to put on and taking off regular lower body clothing?: A Little 6 Click Score: 16    End of Session Equipment Utilized During Treatment: Other (comment)(stedy)  OT Visit Diagnosis: Unsteadiness on feet (R26.81);Other abnormalities of gait and mobility (R26.89);Muscle weakness (generalized) (M62.81);Other symptoms and signs involving cognitive function;Hemiplegia and hemiparesis Hemiplegia - Right/Left: Left Hemiplegia - dominant/non-dominant: Non-Dominant Hemiplegia - caused by: Cerebral  infarction   Activity Tolerance Patient tolerated treatment well   Patient Left in chair;with call bell/phone within reach;with chair alarm set   Nurse Communication Mobility status        Time: [id number] OT Time Calculation (min): 25 min  Charges: OT General Charges $OT Visit: 1 Visit OT Treatments $Self Care/Home Management : 23-37 mins  Lanier Clam., COTA/L Acute Rehabilitation Services [phone]    Ihor Gully 12/19/2018, 3:39 PM

## 2018-12-19 NOTE — TOC Progression Note (Signed)
Transition of Care [facility]) - Progression Note    Patient Details  Name: Glenn [name] MRN: [id number] Date of Birth: 1950-10-15  Transition of Care [facility] CM/SW Contact  Eileen Stanford, LCSW Phone Number: 12/19/2018, 11:17 AM  Clinical Narrative:   [facility] will not have a bed until next week. CSW met with pt at bedside. Pt gave pt his other alternative bed offers. Pt has chosen [facility] They will start auth,    Expected Discharge Plan: [place] Barriers to Discharge: Ship broker, Continued Medical Work up  Expected Discharge Plan and Services Expected Discharge Plan: [place] In-house Referral: Clinical Social Work   Post Acute Care Choice: [place] Living arrangements for the past 2 months: Single Family Home                 DME Arranged: N/A DME Agency: NA       HH Arranged: NA HH Agency: NA         Social Determinants of Health (SDOH) Interventions    Readmission Risk Interventions No flowsheet data found.

## 2018-12-19 NOTE — Progress Notes (Signed)
PROGRESS NOTE    Glenn [name]   [id number] DOB: February 21, 1950 DOA: 12/02/2018  From CIR PCP: [name], MD   Hospital Summary  68 year old gentleman prior history of atrial fibrillation on Eliquis, ischemic cardiomyopathy with left ventricular ejection fraction of 15 to 20% s/p ICD and cardioversion on September 2020, stage III CKD hypertension, hyperlipidemia, recent hospitalization for right MCA subacute hemorrhagic CVA 11/10/18 till 12/02/18 and subsequent discharge to CIR is back with atrial fibrillation with RVR and sepsis secondary to aspiration pneumonia and acute cholecystitis, possible acute on chronic diastolic heart failure and extensive DVT with IVC placement. Currently patient is being followed by cardiology, surgery. For his acute cholecystitis in view of multiple medical problems patient is not a surgical candidate hence he underwent percutaneous cholecystostomy drain placement by IR on 12/13/2018.  Zosyn discontinued after 15 days therapy on 10/29..  A & P   Active Problems:   Rapid atrial fibrillation ([name])   Acute deep vein thrombosis (DVT) of both lower extremities ([name])   A. fib with RVR currently rate controlled and off Cardizem drip, on IV heparin without evidence of cranial rebleeding.  CT head with improvement . Regarding anticoagulation: Discussed with Dr. Leonie Man, stroke neurology.  Since patient has had all of these issues while on Eliquis would recommend switching to [name] covered by insurance. Marland Kitchen Has had >5-day heparin lead-in. Will start on [name] per pharmacy dosing recommendations. Monitor closely for signs of bleed . Continue Toprol-XL 100 mg daily . Digoxin discontinued . Follow-up with cardiology  Sepsis secondary to pneumonia/aspiration pneumonia and acute cholecystitis resolved. Afebrile without leukocytosis or signs of infection.  Off antibiotics . Drain remain for 8 weeks . Patient will follow up outpatient with IR in 8 weeks,  follow-up orders were placed and patient is to record output of drain daily . Flush drain 5 cc sterile saline daily, patient needs prescription for flushes at discharge  Chronic systolic heart failure status post ICD Echo 10/18 with EF 15 to 20%.   Entresto increased . On Toprol-XL 100 mg twice daily  Extensive DVT of left common femoral vein, left popliteal vein, left proximal profunda vein, left posterior tibial veins, left peroneal veins and left gastrocnemius vein status post IVC filter on IV heparin . Plan to transition to [name] per pharmacy dosing recommendations as above  History of MCA hemorrhage stroke with residual left hemiparesis/dysphagia 9/21-10/13 discharge to CIR without new focal deficits at this time.  CT head 10/18 with resolution of intraventricular hemorrhage and expected evolution of basal ganglia hemorrhage and no new hemorrhage present with expected evolution of prominent anterior right MCA territory infarct and volume loss.  CT head 10/29 with some improvement as well.  Discussed with stroke neurology. . SNF placement at discharge   DVT prophylaxis: Heparin-> [name]   Code Status: Full Code  Diet: Dysphagia 2 Disposition Plan: Will likely be medically stable for discharge tomorrow.  Pending SNF bed  Consultants  . IR . Cardiology  Antibiotics  16 days of Zosyn for acute cholecystitis status post percutaneous drain, discontinued 10/29      Subjective   Patient seen and examined at bedside no acute distress and resting comfortably.  No events overnight.  Tolerating diet.  Denies any chest pain, shortness of breath, fever, nausea, vomiting, urinary complaints.  Admits to having bowel movement.  Otherwise ROS negative   Objective   Vitals:   12/18/18 1937 12/18/18 2031 12/19/18 0541 12/19/18 0946  BP:  (!) 122/58 (!) 147/87 136/70  Pulse: 72 (!) 52 86   Resp: (!) 26 18 20  (!) 22  Temp:  98.4 F (36.9 C) 99.1 F (37.3 C)   TempSrc:  Oral Oral    SpO2: 98% 97% 97%   Weight:   79.4 kg   Height:        Intake/Output Summary (Last 24 hours) at 12/19/2018 1526 Last data filed at 12/19/2018 1507 Gross per 24 hour  Intake 888.15 ml  Output 1440 ml  Net -551.85 ml   Filed Weights   12/17/18 0454 12/18/18 0504 12/19/18 0541  Weight: 80.4 kg 80.8 kg 79.4 kg    Examination:  Physical Exam Vitals signs and nursing note reviewed.  Constitutional:      Appearance: Normal appearance.  HENT:     Head: Normocephalic and atraumatic.     Nose: Nose normal.     Mouth/Throat:     Mouth: Mucous membranes are moist.  Eyes:     Extraocular Movements: Extraocular movements intact.  Neck:     Musculoskeletal: Normal range of motion. No neck rigidity.  Cardiovascular:     Rate and Rhythm: Normal rate and regular rhythm.  Pulmonary:     Effort: Pulmonary effort is normal.     Breath sounds: Normal breath sounds.  Abdominal:     General: Abdomen is flat.     Palpations: Abdomen is soft.  Musculoskeletal:        General: No swelling.     Comments: At baseline ROM  Neurological:     General: No focal deficit present.     Mental Status: He is alert. Mental status is at baseline.  Psychiatric:        Mood and Affect: Mood normal.        Behavior: Behavior normal.     Data Reviewed: I have personally reviewed following labs and imaging studies  CBC: Recent Labs  Lab 12/14/18 0413 12/16/18 0727 12/17/18 0428 12/18/18 0409 12/19/18 0339  WBC 9.2 5.2 4.4 3.9* 4.4  HGB 9.9* 10.0* 10.0* 9.5* 10.0*  HCT 30.7* 30.5* 30.9* 29.5* 31.5*  MCV 94.5 94.4 96.0 95.2 95.7  PLT 442* 479* 361 467* [id number]*   Basic Metabolic Panel: Recent Labs  Lab 12/12/18 1918 12/13/18 0312 12/14/18 0413 12/17/18 1132 12/18/18 0409 12/19/18 0339  NA  --  133* 135 137 137 138  K 4.3 4.9 4.1 4.0 3.9 3.7  CL  --  100 102 103 106 103  CO2  --  23 22 25 22 24   GLUCOSE  --  95 100* 113* 102* 97  BUN  --  16 13 8  6* <5*  CREATININE  --  1.02 0.95 1.04  0.93 0.96  CALCIUM  --  8.5* 8.8* 8.8* 8.8* 9.0  MG 2.3 2.3 2.1 1.9  --   --   PHOS  --   --  3.4  --   --   --    GFR: Estimated Creatinine Clearance: 76.4 mL/min (by C-G formula based on SCr of 0.96 mg/dL). Liver Function Tests: No results for input(s): AST, ALT, ALKPHOS, BILITOT, PROT, ALBUMIN in the last 168 hours. No results for input(s): LIPASE, AMYLASE in the last 168 hours. No results for input(s): AMMONIA in the last 168 hours. Coagulation Profile: No results for input(s): INR, PROTIME in the last 168 hours. Cardiac Enzymes: No results for input(s): CKTOTAL, CKMB, CKMBINDEX, TROPONINI in the last 168 hours. BNP (last 3 results) No results for input(s): PROBNP in the last 8760 hours. HbA1C:  No results for input(s): HGBA1C in the last 72 hours. CBG: No results for input(s): GLUCAP in the last 168 hours. Lipid Profile: No results for input(s): CHOL, HDL, LDLCALC, TRIG, CHOLHDL, LDLDIRECT in the last 72 hours. Thyroid Function Tests: No results for input(s): TSH, T4TOTAL, FREET4, T3FREE, THYROIDAB in the last 72 hours. Anemia Panel: No results for input(s): VITAMINB12, FOLATE, FERRITIN, TIBC, IRON, RETICCTPCT in the last 72 hours. Sepsis Labs: No results for input(s): PROCALCITON, LATICACIDVEN in the last 168 hours.  Recent Results (from the past 240 hour(s))  Body fluid culture     Status: None   Collection Time: 12/13/18  1:29 PM   Specimen: BILE; Body Fluid  Result Value Ref Range Status   Specimen Description BILE  Final   Special Requests Normal  Final   Gram Stain   Final    RARE WBC PRESENT,BOTH PMN AND MONONUCLEAR NO ORGANISMS SEEN    Culture   Final    NO GROWTH 3 DAYS Performed at [facility], [address], [place], [place] [address]    Report Status 12/16/2018 FINAL  Final         Radiology Studies: Ct Head Wo Contrast  Result Date: 12/18/2018 CLINICAL DATA:  Follow-up intracranial hemorrhage. Currently taking heparin. Persistent atrial  fibrillation. Persistent fever and respiratory failure. EXAM: CT HEAD WITHOUT CONTRAST TECHNIQUE: Contiguous axial images were obtained from the base of the skull through the vertex without intravenous contrast. COMPARISON:  12/07/2018 FINDINGS: Brain: Mild further decrease in density of the previously demonstrated large right anterior right middle cerebral artery distribution infarct. A small thin rim of probably developing cortical laminar necrosis previously demonstrated in the posterior right frontal lobe is no longer seen. Tiny amount of residual high density visible in the right basal ganglia at the posterior margin of infarction. Moderate dilatation of the ventricles with mild progression with a current transverse diameter of the frontal horns spanning 4.3 cm and previously spanning 4.0 cm. Stable mild-to-moderate diffuse enlargement of the subarachnoid spaces. No acute intracranial hemorrhage or new areas of infarction. Vascular: No hyperdense vessel or unexpected calcification. Skull: Normal. Negative for fracture or focal lesion. Limited by motion at the skull base. Sinuses/Orbits: Limited by motion. Grossly unremarkable. Other: Stable wire fixation of the lateral right orbital wall. IMPRESSION: 1. Mild further decrease in density of the previously demonstrated large right anterior middle cerebral artery distribution infarct with a small thin rim of residual high density in the right basal ganglia at the posterior margin of the infarction. 2. Stable mild-to-moderate diffuse cerebral and cerebellar atrophy with the exception of a further mild decrease in size of the lateral ventricles. Again, this may be due to progressive volume loss associated with the evolving infarct on the right. An element of developing mild communicating hydrocephalus cannot be excluded. 3. No acute abnormality. Electronically Signed   By: Claudie Revering M.D.   On: 12/18/2018 16:43        Scheduled Meds: . atorvastatin  20 mg  Oral q1800  . chlorhexidine  15 mL Mouth Rinse BID  . Chlorhexidine Gluconate Cloth  6 each Topical Daily  . diclofenac sodium  2 g Topical QID  . feeding supplement (ENSURE ENLIVE)  237 mL Oral TID BM  . furosemide  40 mg Oral Daily  . gabapentin  300 mg Oral Q8H  . mouth rinse  15 mL Mouth Rinse q12n4p  . metoprolol succinate  100 mg Oral Daily  . multivitamin with minerals  1  tablet Oral Daily  . pantoprazole  40 mg Oral Daily  . saccharomyces boulardii  250 mg Oral BID  . sacubitril-valsartan  1 tablet Oral BID  . sodium chloride flush  5 mL Intracatheter Q8H   Continuous Infusions: . sodium chloride Stopped (12/10/18 1036)  . heparin 2,250 Units/hr (12/19/18 1315)     LOS: 17 days    Time spent: 36 minutes    Harold Hedge, DO [facility] Pager [phone] If 7PM-7AM, please contact night-coverage [email or web address] Password [email or web address] 12/19/2018, 3:26 PM

## 2018-12-19 NOTE — TOC Benefit Eligibility Note (Signed)
Transition of Care [name]) Benefit Eligibility Note    Patient Details  Name: Glenn [name] MRN: [id number] Date of Birth: 09-Jul-1950   Medication/Dose: pradaxa 150 mg twice daily  Covered?: Yes  Tier: Other(4)  Prescription Coverage Preferred Pharmacy: [facility] or any retail pharmacy  Spoke with Person/Company/Phone Number:: Clarise Cruz /[facility] [phone]  Co-Pay: 3.90 for a 30 day supply     Deductible: (Unavailable)       [name] Phone Number: 12/19/2018, 1:16 PM

## 2018-12-19 NOTE — Care Management Important Message (Signed)
Important Message  Patient Details  Name: Glenn [name] MRN: [id number] Date of Birth: 10/28/1950   Medicare Important Message Given:  Yes     [name] 12/19/2018, 1:05 PM

## 2018-12-19 NOTE — Progress Notes (Signed)
Progress Note  Patient Name: Glenn [name] Date of Encounter: 12/19/2018  Primary Cardiologist:   Virl Axe, MD   Subjective   Denies chest pain or SOB.    Inpatient Medications    Scheduled Meds:  atorvastatin  20 mg Oral q1800   chlorhexidine  15 mL Mouth Rinse BID   Chlorhexidine Gluconate Cloth  6 each Topical Daily   diclofenac sodium  2 g Topical QID   feeding supplement (ENSURE ENLIVE)  237 mL Oral TID BM   furosemide  40 mg Oral Daily   gabapentin  300 mg Oral Q8H   mouth rinse  15 mL Mouth Rinse q12n4p   metoprolol succinate  100 mg Oral Daily   multivitamin with minerals  1 tablet Oral Daily   pantoprazole  40 mg Oral Daily   saccharomyces boulardii  250 mg Oral BID   sacubitril-valsartan  1 tablet Oral BID   sodium chloride flush  5 mL Intracatheter Q8H   Continuous Infusions:  sodium chloride Stopped (12/10/18 1036)   heparin 2,200 Units/hr (12/19/18 0558)   PRN Meds: sodium chloride, acetaminophen, bisacodyl, calcium carbonate, ipratropium, levalbuterol, metoprolol tartrate   Vital Signs    Vitals:   12/18/18 1100 12/18/18 1937 12/18/18 2031 12/19/18 0541  BP: (!) 122/56  (!) 122/58 (!) 147/87  Pulse: 66 72 (!) 52 86  Resp:  (!) 26 18 20   Temp: 98.3 F (36.8 C)  98.4 F (36.9 C) 99.1 F (37.3 C)  TempSrc: Oral  Oral Oral  SpO2: 98% 98% 97% 97%  Weight:    79.4 kg  Height:        Intake/Output Summary (Last 24 hours) at 12/19/2018 0926 Last data filed at 12/19/2018 [id number] Gross per 24 hour  Intake 295.88 ml  Output 1540 ml  Net -1244.12 ml   Filed Weights   12/17/18 0454 12/18/18 0504 12/19/18 0541  Weight: 80.4 kg 80.8 kg 79.4 kg    Telemetry    Atrial fib.  PVCs, trigeminy  - Personally Reviewed  ECG    NA - Personally Reviewed  Physical Exam   GEN: No  acute distress.   Neck: No  JVD Cardiac: Irregular RR, no murmurs, rubs, or gallops.  Respiratory: Clear   to auscultation bilaterally. GI: Soft,  nontender, non-distended, normal bowel sounds  MS:  No edema; No deformity. Neuro:   Hemiparesis Psych: Oriented and appropriate     Labs    Chemistry Recent Labs  Lab 12/17/18 1132 12/18/18 0409 12/19/18 0339  NA 137 137 138  K 4.0 3.9 3.7  CL 103 106 103  CO2 25 22 24   GLUCOSE 113* 102* 97  BUN 8 6* <5*  CREATININE 1.04 0.93 0.96  CALCIUM 8.8* 8.8* 9.0  GFRNONAA >60 >60 >60  GFRAA >60 >60 >60  ANIONGAP 9 9 11      Hematology Recent Labs  Lab 12/17/18 0428 12/18/18 0409 12/19/18 0339  WBC 4.4 3.9* 4.4  RBC 3.22* 3.10* 3.29*  HGB 10.0* 9.5* 10.0*  HCT 30.9* 29.5* 31.5*  MCV 96.0 95.2 95.7  MCH 31.1 30.6 30.4  MCHC 32.4 32.2 31.7  RDW 14.3 14.3 14.3  PLT 361 467* 454*    Cardiac EnzymesNo results for input(s): TROPONINI in the last 168 hours. No results for input(s): TROPIPOC in the last 168 hours.   BNPNo results for input(s): BNP, PROBNP in the last 168 hours.   DDimer No results for input(s): DDIMER in the last 168 hours.   Radiology  Ct Head Wo Contrast  Result Date: 12/18/2018 CLINICAL DATA:  Follow-up intracranial hemorrhage. Currently taking heparin. Persistent atrial fibrillation. Persistent fever and respiratory failure. EXAM: CT HEAD WITHOUT CONTRAST TECHNIQUE: Contiguous axial images were obtained from the base of the skull through the vertex without intravenous contrast. COMPARISON:  12/07/2018 FINDINGS: Brain: Mild further decrease in density of the previously demonstrated large right anterior right middle cerebral artery distribution infarct. A small thin rim of probably developing cortical laminar necrosis previously demonstrated in the posterior right frontal lobe is no longer seen. Tiny amount of residual high density visible in the right basal ganglia at the posterior margin of infarction. Moderate dilatation of the ventricles with mild progression with a current transverse diameter of the frontal horns spanning 4.3 cm and previously spanning  4.0 cm. Stable mild-to-moderate diffuse enlargement of the subarachnoid spaces. No acute intracranial hemorrhage or new areas of infarction. Vascular: No hyperdense vessel or unexpected calcification. Skull: Normal. Negative for fracture or focal lesion. Limited by motion at the skull base. Sinuses/Orbits: Limited by motion. Grossly unremarkable. Other: Stable wire fixation of the lateral right orbital wall. IMPRESSION: 1. Mild further decrease in density of the previously demonstrated large right anterior middle cerebral artery distribution infarct with a small thin rim of residual high density in the right basal ganglia at the posterior margin of the infarction. 2. Stable mild-to-moderate diffuse cerebral and cerebellar atrophy with the exception of a further mild decrease in size of the lateral ventricles. Again, this may be due to progressive volume loss associated with the evolving infarct on the right. An element of developing mild communicating hydrocephalus cannot be excluded. 3. No acute abnormality. Electronically Signed   By: Claudie Revering M.D.   On: 12/18/2018 16:43    Cardiac Studies   ECHO  12/07/18  1. Left ventricular ejection fraction, by visual estimation, is 20%. The left ventricle has severely decreased function. Mildly increased left ventricular size. There is mildly increased left ventricular hypertrophy. 2. Left ventricular diastolic Doppler parameters are indeterminate in the setting of rapid atrial fibrillation. 3. Global right ventricle has mildly reduced systolic function.The right ventricular size is normal. No increase in right ventricular wall thickness. 4. Left atrial size was mild-moderately dilated. 5. Right atrial size was normal. 6. Trivial pericardial effusion is present. 7. The pericardial effusion is posterior to the left ventricle. 8. Mild aortic valve annular calcification. 9. The mitral valve is grossly normal. Mild mitral valve regurgitation. 10. The  tricuspid valve is grossly normal. Tricuspid valve regurgitation mild-moderate. 11. The aortic valve is tricuspid Aortic valve regurgitation is mild to moderate by color flow Doppler. 12. The pulmonic valve was grossly normal. Pulmonic valve regurgitation is mild by color flow Doppler. 13. Moderately elevated pulmonary artery systolic pressure. 14. The inferior vena cava is normal in size with greater than 50% respiratory variability, suggesting right atrial pressure of 3 mmHg. 15. A device wire is visualized. 16. The tricuspid regurgitant velocity is 3.68 m/s, and with an assumed right atrial pressure of 3 mmHg, the estimated right ventricular systolic pressure is moderately  Patient Profile     68 y.o. male with nonischemic cardiomyopathy status post ICD,persistent atrial fibrillation, left lower extremity DVT status post IVC filter, recent hemorrhagic stroke, CKD III, admitted with persistent fever and respiratory failure thought to be due to pneumonia. Cardiology has been assisting with atrial fibrillation with rapid ventricular response.  Assessment & Plan    Atrial fibrillation:    Holding off on oral anticoagulation until  clear from a neurosurgery standpoint.  (See below).   Dig discontinued.   HR controlled.  Continue current beta blocker.   CHRONIC SYSTOLIC HF:  Euvolemic.  Increase Entresto.    Hx DVT:  Resume Eliquis.   Primary care is checking with neuro.   CT of head completed yesterday.    For questions or updates, please contact [place] Please consult [email or web address] for contact info under Cardiology/STEMI.   Signed, Minus Breeding, MD  12/19/2018, 9:26 AM

## 2018-12-19 NOTE — Care Management (Signed)
1112 12-19-18 Benefits check submitted for pradaxa 150 mg twice daily. No discount card is available for [name]. Bethena Roys, RN, BSN Case Manager [phone]

## 2018-12-19 NOTE — Progress Notes (Signed)
ANTICOAGULATION CONSULT NOTE - Follow Up Consult   Pharmacy Consult forHeparin Indication: atrial fibrillation(CHADS2VASc= 5), extensive DVTs/p IVC filter  Allergies  Allergen Reactions  . Benadryl [Diphenhydramine] Palpitations    Patient Measurements: Height: 5\' 7"  (170.2 cm) Weight: 175 lb 0.7 oz (79.4 kg) IBW/kg (Calculated) : 66.1 Heparin Dosing Weight: 85 kg  Vital Signs: Temp: 99.1 F (37.3 C) (10/30 0541) Temp Source: Oral (10/30 0541) BP: 136/70 (10/30 0946) Pulse Rate: 86 (10/30 0541)  Labs: Recent Labs    12/17/18 0428 12/17/18 1132 12/18/18 0409 12/19/18 0339 12/19/18 1139  HGB 10.0*  --  9.5* 10.0*  --   HCT 30.9*  --  29.5* 31.5*  --   PLT 361  --  467* 454*  --   HEPARINUNFRC 0.29*  --  0.50 1.09* 0.31  CREATININE  --  1.04 0.93 0.96  --     Estimated Creatinine Clearance: 76.4 mL/min (by C-G formula based on SCr of 0.96 mg/dL).  Assessment: 69 yoM with rapid Afib(CHADS2VASc= 5)and LLEDVT s/p IVC filter, withhx recent hemorrhagic stroke. Pharmacy cleared to start IV heparin (low goal no bolus) by CCM and neurology with stable head CT on 10/19.Plan to transition to apixaban once cleared by neurosurgery.  -heparin level= 0.31  Goal of Therapy: Heparin level 0.3-0.5units/ml Monitor platelets by anticoagulation protocol: Yes  Plan: -Increase heparin to 2250 units/hr to keep in goal -Heparin level in 6 hours and daily wth CBC daily   Hildred Laser, PharmD Clinical Pharmacist **Pharmacist phone directory can now be found on [email or web address] (PW [email or web address]).  Listed under [place].

## 2018-12-19 NOTE — Progress Notes (Signed)
ANTICOAGULATION CONSULT NOTE - Follow Up Consult   Pharmacy Consult for: [name] (switch from IV heparin  to [name]) Indication: atrial fibrillation(CHADS2VASc= 5), extensive DVTs/p IVC filter  Allergies  Allergen Reactions  . Benadryl [Diphenhydramine] Palpitations    Patient Measurements: Height: 5\' 7"  (170.2 cm) Weight: 175 lb 0.7 oz (79.4 kg) IBW/kg (Calculated) : 66.1 Heparin Dosing Weight: 85 kg  Vital Signs: Temp: 97.4 F (36.3 C) (10/30 1530) Temp Source: Oral (10/30 1530) BP: 124/62 (10/30 1530) Pulse Rate: 65 (10/30 1530)  Labs: Recent Labs    12/17/18 0428 12/17/18 1132 12/18/18 0409 12/19/18 0339 12/19/18 1139  HGB 10.0*  --  9.5* 10.0*  --   HCT 30.9*  --  29.5* 31.5*  --   PLT 361  --  467* 454*  --   HEPARINUNFRC 0.29*  --  0.50 1.09* 0.31  CREATININE  --  1.04 0.93 0.96  --     Estimated Creatinine Clearance: 76.4 mL/min (by C-G formula based on SCr of 0.96 mg/dL).  Assessment: 24 yoM with rapid Afib(CHADS2VASc= 5)and LLEDVT s/p IVC filter, withhx recent hemorrhagic stroke. Pharmacy consulted to switch from  IV heparin to oral [name] today. Has had >5-day heparin lead-in ,thus appropriate to switch to [name] today. MD noted that [name] is covered by insurance.   -per  CCM and neurology patient with stable head CT on 10/19.  Goal of Therapy: Monitor platelets by anticoagulation protocol: Yes  Plan: At 18:00 tonight stop IV heparin drip and give 1st dose of [name] 150 mg po BID   Thank you for allowing pharmacy to be part of this patients care team.  Nicole Cella, RPh Clinical Pharmacist**Pharmacist phone directory can now be found on [email or web address] ([email or web address]).  Listed under [place].

## 2018-12-20 ENCOUNTER — Inpatient Hospital Stay (HOSPITAL_COMMUNITY): Payer: Medicare HMO

## 2018-12-20 DIAGNOSIS — I4811 Longstanding persistent atrial fibrillation: Secondary | ICD-10-CM

## 2018-12-20 LAB — BASIC METABOLIC PANEL
Anion gap: 11 (ref 5–15)
BUN: 7 mg/dL — ABNORMAL LOW (ref 8–23)
CO2: 24 mmol/L (ref 22–32)
Calcium: 9.1 mg/dL (ref 8.9–10.3)
Chloride: 104 mmol/L (ref 98–111)
Creatinine, Ser: 0.95 mg/dL (ref 0.61–1.24)
GFR calc Af Amer: >60 mL/min (ref >60)
GFR calc non Af Amer: >60 mL/min (ref >60)
Glucose, Bld: 104 mg/dL — ABNORMAL HIGH (ref 70–99)
Potassium: 3.9 mmol/L (ref 3.5–5.1)
Sodium: 139 mmol/L (ref 135–145)

## 2018-12-20 LAB — CBC
HCT: 30.3 % — ABNORMAL LOW (ref 39.0–52.0)
Hemoglobin: 9.8 g/dL — ABNORMAL LOW (ref 13.0–17.0)
MCH: 30.4 pg (ref 26.0–34.0)
MCHC: 32.3 g/dL (ref 30.0–36.0)
MCV: 94.1 fL (ref 80.0–100.0)
Platelets: 415 10*3/uL — ABNORMAL HIGH (ref 150–400)
RBC: 3.22 MIL/uL — ABNORMAL LOW (ref 4.22–5.81)
RDW: 14.4 % (ref 11.5–15.5)
WBC: 4.4 10*3/uL (ref 4.0–10.5)
nRBC: 0 % (ref 0.0–0.2)

## 2018-12-20 MED ORDER — MORPHINE SULFATE (PF) 2 MG/ML IV SOLN
2.0000 mg | Freq: Once | INTRAVENOUS | Status: AC
  Administered 2018-12-20: 2 mg via INTRAVENOUS
  Filled 2018-12-20: qty 1

## 2018-12-20 NOTE — Progress Notes (Signed)
Pt had an eventful day and ready for all procedures

## 2018-12-20 NOTE — Progress Notes (Signed)
Progress Note  Patient Name: Glenn [name] Date of Encounter: 12/20/2018  Primary Cardiologist: Virl Axe, MD   Subjective   68 year old gentleman with a history of atrial fibrillation-on Eliquis.  He had a recent cardioversion on September 9.  He has a history of diastolic congestive heart failure, ejection fraction of 15 to 20%, hepatitis C, hypertension, prior left MCA stroke Was admitted with a large subacute hemorrhagic stroke .   He has atrial fibrillation.  We have been holding off oral anticoagulation until it is clear from a neurosurgical standpoint what the plan is.    Inpatient Medications    Scheduled Meds: . atorvastatin  20 mg Oral q1800  . chlorhexidine  15 mL Mouth Rinse BID  . Chlorhexidine Gluconate Cloth  6 each Topical Daily  . dabigatran  150 mg Oral Q12H  . diclofenac sodium  2 g Topical QID  . feeding supplement (ENSURE ENLIVE)  237 mL Oral TID BM  . furosemide  40 mg Oral Daily  . gabapentin  300 mg Oral Q8H  . mouth rinse  15 mL Mouth Rinse q12n4p  . metoprolol succinate  100 mg Oral Daily  . multivitamin with minerals  1 tablet Oral Daily  . pantoprazole  40 mg Oral Daily  . saccharomyces boulardii  250 mg Oral BID  . sacubitril-valsartan  1 tablet Oral BID  . sodium chloride flush  5 mL Intracatheter Q8H   Continuous Infusions: . sodium chloride Stopped (12/10/18 1036)   PRN Meds: sodium chloride, acetaminophen, bisacodyl, calcium carbonate, ipratropium, levalbuterol, metoprolol tartrate   Vital Signs    Vitals:   12/20/18 0306 12/20/18 0309 12/20/18 0750 12/20/18 0900  BP: 126/67  132/70 (!) 151/84  Pulse: 73  87 89  Resp: 17     Temp: 99.2 F (37.3 C)     TempSrc: Oral     SpO2: 98%     Weight:  78.5 kg    Height:        Intake/Output Summary (Last 24 hours) at 12/20/2018 1133 Last data filed at 12/20/2018 0555 Gross per 24 hour  Intake 504.45 ml  Output 930 ml  Net -425.55 ml   Last 3 Weights 12/20/2018  12/19/2018 12/18/2018  Weight (lbs) 173 lb 1 oz 175 lb 0.7 oz 178 lb 2.1 oz  Weight (kg) 78.5 kg 79.4 kg 80.8 kg      Telemetry    Atrial fib with a controlled ventricular response- Personally Reviewed  ECG     - Personally Reviewed  Physical Exam   GEN:  Middle-aged gentleman, no acute distress.  Able to speak but somewhat slow to respond. Neck: No JVD Cardiac:  Irregularly irregular. Respiratory: Clear to auscultation bilaterally. GI:   right upper quadrant drain in place. MS: No edema; No deformity. Neuro:  Nonfocal  Psych: Normal affect   Labs    High Sensitivity Troponin:   Recent Labs  Lab 12/02/18 2209 12/03/18 0225  TROPONINIHS 26* 25*      Chemistry Recent Labs  Lab 12/18/18 0409 12/19/18 0339 12/20/18 0415  NA 137 138 139  K 3.9 3.7 3.9  CL 106 103 104  CO2 22 24 24   GLUCOSE 102* 97 104*  BUN 6* <5* 7*  CREATININE 0.93 0.96 0.95  CALCIUM 8.8* 9.0 9.1  GFRNONAA >60 >60 >60  GFRAA >60 >60 >60  ANIONGAP 9 11 11      Hematology Recent Labs  Lab 12/18/18 0409 12/19/18 0339 12/20/18 0415  WBC 3.9* 4.4  4.4  RBC 3.10* 3.29* 3.22*  HGB 9.5* 10.0* 9.8*  HCT 29.5* 31.5* 30.3*  MCV 95.2 95.7 94.1  MCH 30.6 30.4 30.4  MCHC 32.2 31.7 32.3  RDW 14.3 14.3 14.4  PLT 467* 454* 415*    BNPNo results for input(s): BNP, PROBNP in the last 168 hours.   DDimer No results for input(s): DDIMER in the last 168 hours.   Radiology    Ct Head Wo Contrast  Result Date: 12/18/2018 CLINICAL DATA:  Follow-up intracranial hemorrhage. Currently taking heparin. Persistent atrial fibrillation. Persistent fever and respiratory failure. EXAM: CT HEAD WITHOUT CONTRAST TECHNIQUE: Contiguous axial images were obtained from the base of the skull through the vertex without intravenous contrast. COMPARISON:  12/07/2018 FINDINGS: Brain: Mild further decrease in density of the previously demonstrated large right anterior right middle cerebral artery distribution infarct. A  small thin rim of probably developing cortical laminar necrosis previously demonstrated in the posterior right frontal lobe is no longer seen. Tiny amount of residual high density visible in the right basal ganglia at the posterior margin of infarction. Moderate dilatation of the ventricles with mild progression with a current transverse diameter of the frontal horns spanning 4.3 cm and previously spanning 4.0 cm. Stable mild-to-moderate diffuse enlargement of the subarachnoid spaces. No acute intracranial hemorrhage or new areas of infarction. Vascular: No hyperdense vessel or unexpected calcification. Skull: Normal. Negative for fracture or focal lesion. Limited by motion at the skull base. Sinuses/Orbits: Limited by motion. Grossly unremarkable. Other: Stable wire fixation of the lateral right orbital wall. IMPRESSION: 1. Mild further decrease in density of the previously demonstrated large right anterior middle cerebral artery distribution infarct with a small thin rim of residual high density in the right basal ganglia at the posterior margin of the infarction. 2. Stable mild-to-moderate diffuse cerebral and cerebellar atrophy with the exception of a further mild decrease in size of the lateral ventricles. Again, this may be due to progressive volume loss associated with the evolving infarct on the right. An element of developing mild communicating hydrocephalus cannot be excluded. 3. No acute abnormality. Electronically Signed   By: Claudie Revering M.D.   On: 12/18/2018 16:43    Cardiac Studies    Patient Profile     68 y.o. male   Assessment & Plan    Atrial fibrillation: We are holding off on restarting oral anticoagulation until we get a better understanding of the long-term plan from neurosurgery.  His heart rate is well controlled.  Continue current medications.  2.  Chronic systolic congestive heart failure: He appears to be euvolemic.  Continue current medications including Entresto.  3.   History of DVT.  The plan is to eventually resume Eliquis.  We are awaiting word from neurosurgery as to when that might be safe.       For questions or updates, please contact [place] Please consult [email or web address] for contact info under        Signed, [name], MD  12/20/2018, 11:33 AM

## 2018-12-20 NOTE — Plan of Care (Signed)
  Problem: Clinical Measurements: Goal: Will remain free from infection Outcome: Progressing   Problem: Activity: Goal: Risk for activity intolerance will decrease Outcome: Progressing   Problem: Nutrition: Goal: Adequate nutrition will be maintained Outcome: Progressing   Problem: Coping: Goal: Level of anxiety will decrease Outcome: Progressing   Problem: Pain Managment: Goal: General experience of comfort will improve Outcome: Progressing   Problem: Safety: Goal: Ability to remain free from injury will improve Outcome: Progressing   Problem: Skin Integrity: Goal: Risk for impaired skin integrity will decrease Outcome: Progressing  Gerarda Fraction, RN

## 2018-12-20 NOTE — Plan of Care (Signed)

## 2018-12-20 NOTE — Progress Notes (Signed)
Notified by CCMD of 9 beat run of v-tach. Patient was asymptomatic. Will continue to monitor closely.   Gerarda Fraction, RN

## 2018-12-20 NOTE — Progress Notes (Signed)
PROGRESS NOTE    Glenn [name]   [id number] DOB: [birth year] DOA: 12/02/2018  From CIR PCP: Glenn Dawn, MD   Hospital Summary  68 year old gentleman prior history of atrial fibrillation on Eliquis, ischemic cardiomyopathy with left ventricular ejection fraction of 15 to 20% s/p ICD and cardioversion on September 2020, stage III CKD hypertension, hyperlipidemia, recent hospitalization for right MCA subacute hemorrhagic CVA 11/10/18 till 12/02/18 and subsequent discharge to CIR is back with atrial fibrillation with RVR and sepsis secondary to aspiration pneumonia and acute cholecystitis, possible acute on chronic diastolic heart failure and extensive DVT with IVC placement. Currently patient is being followed by cardiology, surgery. For his acute cholecystitis in view of multiple medical problems patient is not a surgical candidate hence he underwent percutaneous cholecystostomy drain placement by IR on 12/13/2018.  Zosyn discontinued after 15 days therapy on 10/29.  Patient had repeat CT head 10/29 with improved findings.  Discussed restarting p.o. anticoagulation with neurology.  Patient started on [name] 150 mg twice daily.     A & P   Active Problems:   Rapid atrial fibrillation ([name])   Acute deep vein thrombosis (DVT) of both lower extremities ([name])   A. fib with RVR currently rate controlled 100 mg daily and off digoxin.  Started on [name] yesterday . Continue [name] 150 mg twice daily and monitor for signs/symptoms of bleed . Continue Toprol-XL 100 mg daily . Follow-up with cardiology  Sepsis secondary to pneumonia/aspiration pneumonia and acute cholecystitis resolved. Glenn [name] remain for 8 weeks . Patient will follow up outpatient with IR in 8 weeks, follow-up orders were placed and patient is to record output of drain daily . Flush drain 5 cc sterile saline daily, patient needs prescription for flushes at discharge  Chronic systolic heart failure status post  ICD Echo 10/18 with EF 15 to 20%.  Weight down from admission 85 kg->>> 78.5 kg  Entresto increased this admission . On Toprol-XL 100 mg twice daily . Continue Lasix 40 mg p.o. daily  Extensive DVT of left common femoral vein, left popliteal vein, left proximal profunda vein, left posterior tibial veins, left peroneal veins and left gastrocnemius vein status post IVC filter Heparin changed to [name] . Monitor for bleeding  History of MCA hemorrhage stroke with residual left hemiparesis/dysphagia 9/21-10/13 discharge to CIR without new focal deficits at this time.  CT head 10/18 with resolution of intraventricular hemorrhage and expected evolution of basal ganglia hemorrhage and no new hemorrhage present with expected evolution of prominent anterior right MCA territory infarct and volume loss.  CT head 10/29 with some improvement as well.  Discussed with stroke neurology. . SNF placement at discharge   DVT prophylaxis: [name]   Code Status: Full Code  Diet: Dysphagia 2 Disposition Plan: Medically stable for discharge.  Pending SNF placement  Consultants  . IR . Cardiology  Antibiotics  16 days of Zosyn for acute cholecystitis status post percutaneous drain, discontinued 10/29    Subjective   Patient seen and examined at bedside no acute distress and resting comfortably.  No events overnight.  Tolerating diet.  Denies any chest pain, shortness of breath, nausea, vomiting.  Admits to having bowel movement.  Otherwise ROS negative    Objective   Vitals:   12/19/18 2207 12/20/18 0306 12/20/18 0309 12/20/18 0750  BP: (!) 146/55 126/67  132/70  Pulse: (!) 47 73  87  Resp: 20 17    Temp: 98 F (36.7 C) 99.2 F (37.3 C)  TempSrc: Oral Oral    SpO2: 100% 98%    Weight:   78.5 kg   Height:        Intake/Output Summary (Last 24 hours) at 12/20/2018 0838 Last data filed at 12/20/2018 0555 Gross per 24 hour  Intake 504.45 ml  Output 930 ml  Net -425.55 ml   Filed  Weights   12/18/18 0504 12/19/18 0541 12/20/18 0309  Weight: 80.8 kg 79.4 kg 78.5 kg    Examination:  Physical Exam Vitals signs and nursing note reviewed.  Constitutional:      Appearance: Normal appearance.  HENT:     Head: Normocephalic and atraumatic.     Nose: Nose normal.     Mouth/Throat:     Mouth: Mucous membranes are moist.  Eyes:     Extraocular Movements: Extraocular movements intact.  Cardiovascular:     Rate and Rhythm: Normal rate and regular rhythm.     Comments: No orthopnea Pulmonary:     Effort: Pulmonary effort is normal.     Breath sounds: Normal breath sounds.  Abdominal:     General: Abdomen is flat.     Palpations: Abdomen is soft.  Musculoskeletal: Normal range of motion.        General: No swelling.  Neurological:     Mental Status: He is alert. Mental status is at baseline.  Psychiatric:        Mood and Affect: Mood normal.        Behavior: Behavior normal.     Data Reviewed: I have personally reviewed following labs and imaging studies  CBC: Recent Labs  Lab 12/16/18 0727 12/17/18 0428 12/18/18 0409 12/19/18 0339 12/20/18 0415  WBC 5.2 4.4 3.9* 4.4 4.4  HGB 10.0* 10.0* 9.5* 10.0* 9.8*  HCT 30.5* 30.9* 29.5* 31.5* 30.3*  MCV 94.4 96.0 95.2 95.7 94.1  PLT 479* 361 467* 454* [id number]*   Basic Metabolic Panel: Recent Labs  Lab 12/14/18 0413 12/17/18 1132 12/18/18 0409 12/19/18 0339 12/20/18 0415  NA 135 137 137 138 139  K 4.1 4.0 3.9 3.7 3.9  CL 102 103 106 103 104  CO2 22 25 22 24 24   GLUCOSE 100* 113* 102* 97 104*  BUN 13 8 6* <5* 7*  CREATININE 0.95 1.04 0.93 0.96 0.95  CALCIUM 8.8* 8.8* 8.8* 9.0 9.1  MG 2.1 1.9  --   --   --   PHOS 3.4  --   --   --   --    GFR: Estimated Creatinine Clearance: 71.5 mL/min (by C-G formula based on SCr of 0.95 mg/dL). Liver Function Tests: No results for input(s): AST, ALT, ALKPHOS, BILITOT, PROT, ALBUMIN in the last 168 hours. No results for input(s): LIPASE, AMYLASE in the last 168  hours. No results for input(s): AMMONIA in the last 168 hours. Coagulation Profile: No results for input(s): INR, PROTIME in the last 168 hours. Cardiac Enzymes: No results for input(s): CKTOTAL, CKMB, CKMBINDEX, TROPONINI in the last 168 hours. BNP (last 3 results) No results for input(s): PROBNP in the last 8760 hours. HbA1C: No results for input(s): HGBA1C in the last 72 hours. CBG: No results for input(s): GLUCAP in the last 168 hours. Lipid Profile: No results for input(s): CHOL, HDL, LDLCALC, TRIG, CHOLHDL, LDLDIRECT in the last 72 hours. Thyroid Function Tests: No results for input(s): TSH, T4TOTAL, FREET4, T3FREE, THYROIDAB in the last 72 hours. Anemia Panel: No results for input(s): VITAMINB12, FOLATE, FERRITIN, TIBC, IRON, RETICCTPCT in the last 72 hours. Sepsis Labs:  No results for input(s): PROCALCITON, LATICACIDVEN in the last 168 hours.  Recent Results (from the past 240 hour(s))  Body fluid culture     Status: None   Collection Time: 12/13/18  1:29 PM   Specimen: BILE; Body Fluid  Result Value Ref Range Status   Specimen Description BILE  Final   Special Requests Normal  Final   Gram Stain   Final    RARE WBC PRESENT,BOTH PMN AND MONONUCLEAR NO ORGANISMS SEEN    Culture   Final    NO GROWTH 3 DAYS Performed at [facility], [address], [place], [place] [address]    Report Status 12/16/2018 FINAL  Final         Radiology Studies: Ct Head Wo Contrast  Result Date: 12/18/2018 CLINICAL DATA:  Follow-up intracranial hemorrhage. Currently taking heparin. Persistent atrial fibrillation. Persistent fever and respiratory failure. EXAM: CT HEAD WITHOUT CONTRAST TECHNIQUE: Contiguous axial images were obtained from the base of the skull through the vertex without intravenous contrast. COMPARISON:  12/07/2018 FINDINGS: Brain: Mild further decrease in density of the previously demonstrated large right anterior right middle cerebral artery distribution  infarct. A small thin rim of probably developing cortical laminar necrosis previously demonstrated in the posterior right frontal lobe is no longer seen. Tiny amount of residual high density visible in the right basal ganglia at the posterior margin of infarction. Moderate dilatation of the ventricles with mild progression with a current transverse diameter of the frontal horns spanning 4.3 cm and previously spanning 4.0 cm. Stable mild-to-moderate diffuse enlargement of the subarachnoid spaces. No acute intracranial hemorrhage or new areas of infarction. Vascular: No hyperdense vessel or unexpected calcification. Skull: Normal. Negative for fracture or focal lesion. Limited by motion at the skull base. Sinuses/Orbits: Limited by motion. Grossly unremarkable. Other: Stable wire fixation of the lateral right orbital wall. IMPRESSION: 1. Mild further decrease in density of the previously demonstrated large right anterior middle cerebral artery distribution infarct with a small thin rim of residual high density in the right basal ganglia at the posterior margin of the infarction. 2. Stable mild-to-moderate diffuse cerebral and cerebellar atrophy with the exception of a further mild decrease in size of the lateral ventricles. Again, this may be due to progressive volume loss associated with the evolving infarct on the right. An element of developing mild communicating hydrocephalus cannot be excluded. 3. No acute abnormality. Electronically Signed   By: Claudie Revering M.D.   On: 12/18/2018 16:43        Scheduled Meds: . atorvastatin  20 mg Oral q1800  . chlorhexidine  15 mL Mouth Rinse BID  . Chlorhexidine Gluconate Cloth  6 each Topical Daily  . dabigatran  150 mg Oral Q12H  . diclofenac sodium  2 g Topical QID  . feeding supplement (ENSURE ENLIVE)  237 mL Oral TID BM  . furosemide  40 mg Oral Daily  . gabapentin  300 mg Oral Q8H  . mouth rinse  15 mL Mouth Rinse q12n4p  . metoprolol succinate  100 mg  Oral Daily  . multivitamin with minerals  1 tablet Oral Daily  . pantoprazole  40 mg Oral Daily  . saccharomyces boulardii  250 mg Oral BID  . sacubitril-valsartan  1 tablet Oral BID  . sodium chloride flush  5 mL Intracatheter Q8H   Continuous Infusions: . sodium chloride Stopped (12/10/18 1036)     LOS: 18 days    Time spent: 15 minutes    Kevan Ny  Derry Lory, DO [facility] Pager [phone] If 7PM-7AM, please contact night-coverage [email or web address] Password [facility] 12/20/2018, 8:38 AM

## 2018-12-20 NOTE — Progress Notes (Signed)
ANTICOAGULATION CONSULT NOTE - Follow Up Consult   Pharmacy Consult for: [name] (switch from IV heparin  to [name]) Indication: atrial fibrillation(CHADS2VASc= 5), extensive DVTs/p IVC filter  Allergies  Allergen Reactions  . Benadryl [Diphenhydramine] Palpitations    Patient Measurements: Height: 5\' 7"  (170.2 cm) Weight: 173 lb 1 oz (78.5 kg) IBW/kg (Calculated) : 66.1 Heparin Dosing Weight: 85 kg  Vital Signs: Temp: 99.2 F (37.3 C) (10/31 0306) Temp Source: Oral (10/31 0306) BP: 132/70 (10/31 0750) Pulse Rate: 87 (10/31 0750)  Labs: Recent Labs    12/18/18 0409 12/19/18 0339 12/19/18 1139 12/20/18 0415  HGB 9.5* 10.0*  --  9.8*  HCT 29.5* 31.5*  --  30.3*  PLT 467* 454*  --  415*  HEPARINUNFRC 0.50 1.09* 0.31  --   CREATININE 0.93 0.96  --  0.95    Estimated Creatinine Clearance: 71.5 mL/min (by C-G formula based on SCr of 0.95 mg/dL).  Assessment: 68 yo male with rapid Afib(CHADS2VASc= 5)and LLEDVT s/p IVC filter, withhistory recent hemorrhagic stroke. Pharmacy consulted to switch from  IV heparin to oral [name] today. Has had >5-day heparin lead-in ,thus appropriate to switch to [name] today. MD noted that [name] is covered by insurance. per CCM and neurology patient with stable head CT on 10/19.  Heparin drip stopped 10/30 at 1800, first dose of [name] given at 1830.   Goal of Therapy: Monitor platelets by anticoagulation protocol: Yes  Plan: Continue [name] 150 mg BID Monitor for s/sx's of bleed     Thank you for allowing pharmacy to participate in this patient's care.  Addley Ballinger L. Devin Going, [place] PGY1 Pharmacy Resident 12/20/18      8:28 AM  Please check AMION for all [place] phone numbers After 10:00 PM, call the [facility] [phone]

## 2018-12-21 LAB — CBC
HCT: 32.4 % — ABNORMAL LOW (ref 39.0–52.0)
Hemoglobin: 10.4 g/dL — ABNORMAL LOW (ref 13.0–17.0)
MCH: 30.6 pg (ref 26.0–34.0)
MCHC: 32.1 g/dL (ref 30.0–36.0)
MCV: 95.3 fL (ref 80.0–100.0)
Platelets: 382 10*3/uL (ref 150–400)
RBC: 3.4 MIL/uL — ABNORMAL LOW (ref 4.22–5.81)
RDW: 14.5 % (ref 11.5–15.5)
WBC: 4.6 10*3/uL (ref 4.0–10.5)
nRBC: 0 % (ref 0.0–0.2)

## 2018-12-21 MED ORDER — SPIRONOLACTONE 25 MG PO TABS
25.0000 mg | ORAL_TABLET | Freq: Every day | ORAL | Status: DC
  Administered 2018-12-21 – 2018-12-24 (×4): 25 mg via ORAL
  Filled 2018-12-21 (×4): qty 1

## 2018-12-21 NOTE — Progress Notes (Signed)
Central Telemetry calls states pt had 6 beat run of V-Tach and resumed Afib. Pt A&O, denies CP, dizziness or SOB. B/P 129/65, HR 84.

## 2018-12-21 NOTE — Progress Notes (Signed)
PROGRESS NOTE    Glenn [name]   [id number] DOB: 1950-04-14 DOA: 12/02/2018  From CIR PCP: [name], MD   Hospital Summary  68 year old gentleman prior history of atrial fibrillation on Eliquis, ischemic cardiomyopathy with left ventricular ejection fraction of 15 to 20% s/p ICD and cardioversion on September 2020, stage III CKD hypertension, hyperlipidemia, recent hospitalization for right MCA subacute hemorrhagic CVA 11/10/18 till 12/02/18 and subsequent discharge to CIR is back with atrial fibrillation with RVR and sepsis secondary to aspiration pneumonia and acute cholecystitis, possible acute on chronic diastolic heart failure and extensive DVT with IVC placement. Currently patient is being followed by cardiology, surgery. For his acute cholecystitis in view of multiple medical problems patient is not a surgical candidate hence he underwent percutaneous cholecystostomy drain placement by IR on 12/13/2018.  Zosyn discontinued after 15 days therapy on 10/29.  Patient had repeat CT head 10/29 with improved findings.  Discussed restarting p.o. anticoagulation with neurology.  Patient started on [name] 150 mg twice daily.     A & P   Active Problems:   Rapid atrial fibrillation ([name])   Acute deep vein thrombosis (DVT) of both lower extremities ([name])   RUQ abdominal pain resolved. RUQ [place] 10/31 similar to prior.  A. fib with RVR with NSVT x 6 beats . Continue [name] 150 mg twice daily and monitor for signs/symptoms of bleed . Continue Toprol-XL 100 mg daily . Follow-up with cardiology  Sepsis secondary to pneumonia/aspiration pneumonia and acute cholecystitis resolved. [name] to remain for 8 weeks . Patient will follow up outpatient with IR in 8 weeks, follow-up orders were placed and patient is to record output of drain daily . Flush drain 5 cc sterile saline daily, patient needs prescription for flushes at discharge  Chronic systolic heart failure status post  ICD Echo 10/18 with EF 15 to 20%.  Weight down from admission 85 kg->>> 78.5 kg  Entresto increased this admission . On Toprol-XL 100 mg twice daily . Continue Lasix 40 mg p.o. daily  Extensive DVT of left common femoral vein, left popliteal vein, left proximal profunda vein, left posterior tibial veins, left peroneal veins and left gastrocnemius vein status post IVC filter with chronic left lower extremity dull pain . Continue [name] 150 mg BID  History of MCA hemorrhage stroke with residual left hemiparesis/dysphagia 9/21-10/13 discharge to CIR without new focal deficits at this time.  CT head 10/18 with resolution of intraventricular hemorrhage and expected evolution of basal ganglia hemorrhage and no new hemorrhage present with expected evolution of prominent anterior right MCA territory infarct and volume loss.  CT head 10/29 with some improvement as well.  Discussed with stroke neurology. . SNF placement at discharge   DVT prophylaxis: [name]   Code Status: Full Code  Diet: Dysphagia 2 No family at bedside Disposition Plan: Medically stable for discharge.  Pending SNF placement  Consultants  . IR . Cardiology  Antibiotics  16 days of Zosyn for acute cholecystitis status post percutaneous drain, discontinued 10/29    Subjective   Patient seen and examined sitting upright in chair eating lunch in no acute distress resting comfortably.  States his abdominal pain improved with pain medication last night.  Has not had recurrent. also admits to persistent and chronic dull left leg pain.  Has had a bowel movement today.  Denies any complaints at this time and is anticipating discharge.    Objective   Vitals:   12/21/18 0555 12/21/18 0939 12/21/18 1200 12/21/18 1357  BP: (!) 149/64 117/81 120/66 120/68  Pulse: (!) 57 84 86 83  Resp: (!) 22     Temp: 98.4 F (36.9 C)   97.8 F (36.6 C)  TempSrc: Oral   Oral  SpO2: 98%   98%  Weight: 77 kg     Height:         Intake/Output Summary (Last 24 hours) at 12/21/2018 1756 Last data filed at 12/20/2018 1800 Gross per 24 hour  Intake -  Output 400 ml  Net -400 ml   Filed Weights   12/19/18 0541 12/20/18 0309 12/21/18 0555  Weight: 79.4 kg 78.5 kg 77 kg    Examination:  Physical Exam Vitals signs and nursing note reviewed.  Constitutional:      Appearance: Normal appearance.  HENT:     Head: Normocephalic and atraumatic.     Nose: Nose normal.     Mouth/Throat:     Mouth: Mucous membranes are moist.  Eyes:     Extraocular Movements: Extraocular movements intact.  Neck:     Musculoskeletal: Normal range of motion. No neck rigidity.  Cardiovascular:     Rate and Rhythm: Normal rate. Rhythm irregular.  Pulmonary:     Effort: Pulmonary effort is normal.     Breath sounds: Normal breath sounds.  Abdominal:     General: Abdomen is flat.     Palpations: Abdomen is soft.  Musculoskeletal:        General: No swelling.  Neurological:     Mental Status: He is alert. Mental status is at baseline.  Psychiatric:        Mood and Affect: Mood normal.        Behavior: Behavior normal.     Data Reviewed: I have personally reviewed following labs and imaging studies  CBC: Recent Labs  Lab 12/17/18 0428 12/18/18 0409 12/19/18 0339 12/20/18 0415 12/21/18 0450  WBC 4.4 3.9* 4.4 4.4 4.6  HGB 10.0* 9.5* 10.0* 9.8* 10.4*  HCT 30.9* 29.5* 31.5* 30.3* 32.4*  MCV 96.0 95.2 95.7 94.1 95.3  PLT 361 467* 454* 415* [id number]   Basic Metabolic Panel: Recent Labs  Lab 12/17/18 1132 12/18/18 0409 12/19/18 0339 12/20/18 0415  NA 137 137 138 139  K 4.0 3.9 3.7 3.9  CL 103 106 103 104  CO2 25 22 24 24   GLUCOSE 113* 102* 97 104*  BUN 8 6* <5* 7*  CREATININE 1.04 0.93 0.96 0.95  CALCIUM 8.8* 8.8* 9.0 9.1  MG 1.9  --   --   --    GFR: Estimated Creatinine Clearance: 71.5 mL/min (by C-G formula based on SCr of 0.95 mg/dL). Liver Function Tests: No results for input(s): AST, ALT, ALKPHOS, BILITOT,  PROT, ALBUMIN in the last 168 hours. No results for input(s): LIPASE, AMYLASE in the last 168 hours. No results for input(s): AMMONIA in the last 168 hours. Coagulation Profile: No results for input(s): INR, PROTIME in the last 168 hours. Cardiac Enzymes: No results for input(s): CKTOTAL, CKMB, CKMBINDEX, TROPONINI in the last 168 hours. BNP (last 3 results) No results for input(s): PROBNP in the last 8760 hours. HbA1C: No results for input(s): HGBA1C in the last 72 hours. CBG: No results for input(s): GLUCAP in the last 168 hours. Lipid Profile: No results for input(s): CHOL, HDL, LDLCALC, TRIG, CHOLHDL, LDLDIRECT in the last 72 hours. Thyroid Function Tests: No results for input(s): TSH, T4TOTAL, FREET4, T3FREE, THYROIDAB in the last 72 hours. Anemia Panel: No results for input(s): VITAMINB12, FOLATE, FERRITIN, TIBC,  IRON, RETICCTPCT in the last 72 hours. Sepsis Labs: No results for input(s): PROCALCITON, LATICACIDVEN in the last 168 hours.  Recent Results (from the past 240 hour(s))  Body fluid culture     Status: None   Collection Time: 12/13/18  1:29 PM   Specimen: BILE; Body Fluid  Result Value Ref Range Status   Specimen Description BILE  Final   Special Requests Normal  Final   Gram Stain   Final    RARE WBC PRESENT,BOTH PMN AND MONONUCLEAR NO ORGANISMS SEEN    Culture   Final    NO GROWTH 3 DAYS Performed at [facility], [address], [place], [place] [address]    Report Status 12/16/2018 FINAL  Final         Radiology Studies: US Abdomen Complete  Result Date: 12/20/2018 CLINICAL DATA:  RIGHT upper quadrant pain, has cholecystostomy drain in place, sepsis, rapid atrial fibrillation, hepatitis-C, hypertension EXAM: ABDOMEN ULTRASOUND COMPLETE COMPARISON:  12/11/2018 FINDINGS: Gallbladder: Incompletely distended. Shadowing echogenic material within the gallbladder, likely a combination of previously identified stones, sludge, and indwelling  cholecystostomy tube. Gallbladder wall appears mildly thickened. Patient demonstrates a sonographic Murphy sign. No definite pericholecystic fluid. Common bile duct: Diameter: 4 mm, normal Liver: Normal appearance without definite mass or nodularity. Portal vein is patent on color Doppler imaging with normal direction of blood flow towards the liver. IVC: Normal appearance Pancreas: Incompletely visualized due to bowel gas at tail and head. Visualized portion normal appearance Spleen: Normal appearance, 7.1 cm length Right Kidney: Length: 9.0 cm. Normal morphology without mass or hydronephrosis. Left Kidney: Length: 10.3 cm. Normal morphology without mass or hydronephrosis. Abdominal aorta: Normal caliber Other findings: No free fluid IMPRESSION: Shadowing echogenic material within gallbladder likely representing a combination of cholecystostomy tube, previously identified stones, air from tube, and associated sludge. Persistent gallbladder wall thickening and sonographic Murphy sign. Remainder of exam unremarkable. Electronically Signed   By: Lavonia Dana M.D.   On: 12/20/2018 17:03        Scheduled Meds: . atorvastatin  20 mg Oral q1800  . chlorhexidine  15 mL Mouth Rinse BID  . Chlorhexidine Gluconate Cloth  6 each Topical Daily  . dabigatran  150 mg Oral Q12H  . diclofenac sodium  2 g Topical QID  . feeding supplement (ENSURE ENLIVE)  237 mL Oral TID BM  . furosemide  40 mg Oral Daily  . gabapentin  300 mg Oral Q8H  . mouth rinse  15 mL Mouth Rinse q12n4p  . metoprolol succinate  100 mg Oral Daily  . multivitamin with minerals  1 tablet Oral Daily  . pantoprazole  40 mg Oral Daily  . saccharomyces boulardii  250 mg Oral BID  . sacubitril-valsartan  1 tablet Oral BID  . sodium chloride flush  5 mL Intracatheter Q8H  . spironolactone  25 mg Oral Daily   Continuous Infusions: . sodium chloride Stopped (12/10/18 1036)     LOS: 19 days    Time spent: 15 minutes    Harold Hedge, DO  [facility] Pager [phone] If 7PM-7AM, please contact night-coverage [email or web address] Password [facility] 12/21/2018, 5:56 PM

## 2018-12-21 NOTE — Progress Notes (Signed)
Progress Note  Patient Name: Glenn [name] Date of Encounter: 12/21/2018  Primary Cardiologist: Virl Axe, MD   Subjective   68 year old gentleman with a history of atrial fibrillation-on Eliquis.  He had a recent cardioversion on September 9.  He has a history of diastolic congestive heart failure, ejection fraction of 15 to 20%, hepatitis C, hypertension, prior left MCA stroke Was admitted with a large subacute hemorrhagic stroke .   He has been started on [name] 150 BID  Afib rate is well controlled.    Inpatient Medications    Scheduled Meds:  atorvastatin  20 mg Oral q1800   chlorhexidine  15 mL Mouth Rinse BID   Chlorhexidine Gluconate Cloth  6 each Topical Daily   dabigatran  150 mg Oral Q12H   diclofenac sodium  2 g Topical QID   feeding supplement (ENSURE ENLIVE)  237 mL Oral TID BM   furosemide  40 mg Oral Daily   gabapentin  300 mg Oral Q8H   mouth rinse  15 mL Mouth Rinse q12n4p   metoprolol succinate  100 mg Oral Daily   multivitamin with minerals  1 tablet Oral Daily   pantoprazole  40 mg Oral Daily   saccharomyces boulardii  250 mg Oral BID   sacubitril-valsartan  1 tablet Oral BID   sodium chloride flush  5 mL Intracatheter Q8H   Continuous Infusions:  sodium chloride Stopped (12/10/18 1036)   PRN Meds: sodium chloride, acetaminophen, bisacodyl, calcium carbonate, ipratropium, levalbuterol, metoprolol tartrate   Vital Signs    Vitals:   12/20/18 1400 12/20/18 2004 12/21/18 0555 12/21/18 0939  BP: (!) 142/73 131/69 (!) 149/64 117/81  Pulse: 71 67 (!) 57 84  Resp: 18 17 (!) 22   Temp: 98.1 F (36.7 C) 98.5 F (36.9 C) 98.4 F (36.9 C)   TempSrc: Oral Oral Oral   SpO2: 97% 98% 98%   Weight:   77 kg   Height:        Intake/Output Summary (Last 24 hours) at 12/21/2018 1019 Last data filed at 12/20/2018 1800 Gross per 24 hour  Intake --  Output 400 ml  Net -400 ml   Last 3 Weights 12/21/2018 12/20/2018 12/19/2018   Weight (lbs) 169 lb 12.8 oz 173 lb 1 oz 175 lb 0.7 oz  Weight (kg) 77.021 kg 78.5 kg 79.4 kg      Telemetry    Atrial fib with a controlled ventricular response- Personally Reviewed  ECG     - Personally Reviewed  Physical Exam   Physical Exam: Blood pressure 117/81, pulse 84, temperature 98.4 F (36.9 C), temperature source Oral, resp. rate (!) 22, height 5\' 7"  (1.702 m), weight 77 kg, SpO2 98 %.  GEN:   Middle age man,  NAD  HEENT: Normal NECK: No JVD; No carotid bruits LYMPHATICS: No lymphadenopathy CARDIAC:  Irreg. Irreg.  RESPIRATORY:  Clear to auscultation without rales, wheezing or rhonchi  ABDOMEN: Soft, non-tender, non-distended MUSCULOSKELETAL:  No edema; No deformity  SKIN: Warm and dry NEUROLOGIC:  Alert and oriented x 3   Labs    High Sensitivity Troponin:   Recent Labs  Lab 12/02/18 2209 12/03/18 0225  TROPONINIHS 26* 25*      Chemistry Recent Labs  Lab 12/18/18 0409 12/19/18 0339 12/20/18 0415  NA 137 138 139  K 3.9 3.7 3.9  CL 106 103 104  CO2 22 24 24   GLUCOSE 102* 97 104*  BUN 6* <5* 7*  CREATININE 0.93 0.96 0.95  CALCIUM 8.8* 9.0 9.1  GFRNONAA >60 >60 >60  GFRAA >60 >60 >60  ANIONGAP 9 11 11      Hematology Recent Labs  Lab 12/19/18 0339 12/20/18 0415 12/21/18 0450  WBC 4.4 4.4 4.6  RBC 3.29* 3.22* 3.40*  HGB 10.0* 9.8* 10.4*  HCT 31.5* 30.3* 32.4*  MCV 95.7 94.1 95.3  MCH 30.4 30.4 30.6  MCHC 31.7 32.3 32.1  RDW 14.3 14.4 14.5  PLT 454* 415* 382    BNPNo results for input(s): BNP, PROBNP in the last 168 hours.   DDimer No results for input(s): DDIMER in the last 168 hours.   Radiology    US Abdomen Complete  Result Date: 12/20/2018 CLINICAL DATA:  RIGHT upper quadrant pain, has cholecystostomy drain in place, sepsis, rapid atrial fibrillation, hepatitis-C, hypertension EXAM: ABDOMEN ULTRASOUND COMPLETE COMPARISON:  12/11/2018 FINDINGS: Gallbladder: Incompletely distended. Shadowing echogenic material within the  gallbladder, likely a combination of previously identified stones, sludge, and indwelling cholecystostomy tube. Gallbladder wall appears mildly thickened. Patient demonstrates a sonographic Murphy sign. No definite pericholecystic fluid. Common bile duct: Diameter: 4 mm, normal Liver: Normal appearance without definite mass or nodularity. Portal vein is patent on color Doppler imaging with normal direction of blood flow towards the liver. IVC: Normal appearance Pancreas: Incompletely visualized due to bowel gas at tail and head. Visualized portion normal appearance Spleen: Normal appearance, 7.1 cm length Right Kidney: Length: 9.0 cm. Normal morphology without mass or hydronephrosis. Left Kidney: Length: 10.3 cm. Normal morphology without mass or hydronephrosis. Abdominal aorta: Normal caliber Other findings: No free fluid IMPRESSION: Shadowing echogenic material within gallbladder likely representing a combination of cholecystostomy tube, previously identified stones, air from tube, and associated sludge. Persistent gallbladder wall thickening and sonographic Murphy sign. Remainder of exam unremarkable. Electronically Signed   By: Lavonia Dana M.D.   On: 12/20/2018 17:03    Cardiac Studies    Patient Profile     68 y.o. male   Assessment & Plan    1.  Atrial fibrillation: He is back on [name].  Heart rate is well controlled.  2.  Chronic systolic congestive heart failure: Appears to be euvolemic.  He is currently on Entresto, furosemide 40 mg a day, , Toprol-XL 100 mg a day I will add in Aldactone 25 mg a day.  We may be able to reduce the dose of Lasix following that.   3.  History of DVT.  Currently on [name]    For questions or updates, please contact [place] Please consult [email or web address] for contact info under        Signed, [name], MD  12/21/2018, 10:19 AM

## 2018-12-21 NOTE — Plan of Care (Signed)

## 2018-12-21 NOTE — Progress Notes (Signed)
Pt out of bed sat in the chair for lunch. General knowledge of diagnosis has improved

## 2018-12-22 DIAGNOSIS — E876 Hypokalemia: Secondary | ICD-10-CM

## 2018-12-22 LAB — BASIC METABOLIC PANEL
Anion gap: 11 (ref 5–15)
BUN: 7 mg/dL — ABNORMAL LOW (ref 8–23)
CO2: 22 mmol/L (ref 22–32)
Calcium: 9.1 mg/dL (ref 8.9–10.3)
Chloride: 103 mmol/L (ref 98–111)
Creatinine, Ser: 0.97 mg/dL (ref 0.61–1.24)
GFR calc Af Amer: >60 mL/min (ref >60)
GFR calc non Af Amer: >60 mL/min (ref >60)
Glucose, Bld: 121 mg/dL — ABNORMAL HIGH (ref 70–99)
Potassium: 3.7 mmol/L (ref 3.5–5.1)
Sodium: 136 mmol/L (ref 135–145)

## 2018-12-22 NOTE — Progress Notes (Signed)
Cardiology Progress Note  Patient ID: Glenn [name] MRN: [id number] DOB: 1950/09/22 Date of Encounter: 12/22/2018  Primary Cardiologist: Virl Axe, MD  Subjective  Doing well overnight.  No complaints this morning other than pain at his drain site.  Heart rates controlled in the 70-80 range overnight.  Frequent PVCs noted.  Denies chest pain trouble breathing.  ROS:  All other ROS reviewed and negative. Pertinent positives noted in the HPI.     Inpatient Medications  Scheduled Meds:  atorvastatin  20 mg Oral q1800   chlorhexidine  15 mL Mouth Rinse BID   Chlorhexidine Gluconate Cloth  6 each Topical Daily   dabigatran  150 mg Oral Q12H   diclofenac sodium  2 g Topical QID   feeding supplement (ENSURE ENLIVE)  237 mL Oral TID BM   furosemide  40 mg Oral Daily   gabapentin  300 mg Oral Q8H   mouth rinse  15 mL Mouth Rinse q12n4p   metoprolol succinate  100 mg Oral Daily   multivitamin with minerals  1 tablet Oral Daily   pantoprazole  40 mg Oral Daily   saccharomyces boulardii  250 mg Oral BID   sacubitril-valsartan  1 tablet Oral BID   sodium chloride flush  5 mL Intracatheter Q8H   spironolactone  25 mg Oral Daily   Continuous Infusions:  sodium chloride Stopped (12/10/18 1036)   PRN Meds: sodium chloride, acetaminophen, bisacodyl, calcium carbonate, ipratropium, levalbuterol, metoprolol tartrate   Vital Signs   Vitals:   12/21/18 1200 12/21/18 1357 12/21/18 1935 12/22/18 0512  BP: 120/66 120/68 111/69 137/84  Pulse: 86 83 (!) 44 91  Resp:   20 19  Temp:  97.8 F (36.6 C) 97.9 F (36.6 C) 98.2 F (36.8 C)  TempSrc:  Oral Oral Oral  SpO2:  98% 100% 96%  Weight:    80.6 kg  Height:        Intake/Output Summary (Last 24 hours) at 12/22/2018 0835 Last data filed at 12/22/2018 0700 Gross per 24 hour  Intake 725 ml  Output 170 ml  Net 555 ml   Last 3 Weights 12/22/2018 12/21/2018 12/20/2018  Weight (lbs) 177 lb 12.8 oz 169 lb 12.8 oz 173  lb 1 oz  Weight (kg) 80.65 kg 77.021 kg 78.5 kg      Telemetry  Overnight telemetry shows atrial fibrillation with heart rate in the 60-70 range, frequent PVCs, which I personally reviewed.   Physical Exam   Vitals:   12/21/18 1200 12/21/18 1357 12/21/18 1935 12/22/18 0512  BP: 120/66 120/68 111/69 137/84  Pulse: 86 83 (!) 44 91  Resp:   20 19  Temp:  97.8 F (36.6 C) 97.9 F (36.6 C) 98.2 F (36.8 C)  TempSrc:  Oral Oral Oral  SpO2:  98% 100% 96%  Weight:    80.6 kg  Height:         Intake/Output Summary (Last 24 hours) at 12/22/2018 0835 Last data filed at 12/22/2018 0700 Gross per 24 hour  Intake 725 ml  Output 170 ml  Net 555 ml    Last 3 Weights 12/22/2018 12/21/2018 12/20/2018  Weight (lbs) 177 lb 12.8 oz 169 lb 12.8 oz 173 lb 1 oz  Weight (kg) 80.65 kg 77.021 kg 78.5 kg    Body mass index is 27.85 kg/m.  General: Well nourished, well developed, in no acute distress Head: Atraumatic, normal size  Eyes: PEERLA, EOMI  Neck: Supple, no JVD Endocrine: No thryomegaly Cardiac: Irregular rhythm, no  murmurs rubs or gallops noted Lungs: Clear to auscultation bilaterally, no wheezing, rhonchi or rales  Abd: Soft, nontender, no hepatomegaly  Ext: No edema, pulses 2+ Musculoskeletal: No deformities, BUE and BLE strength normal and equal Skin: Warm and dry, no rashes   Neuro: Alert and oriented to person, place, time, and situation, CNII-XII grossly intact, no focal deficits  Psych: Normal mood and affect   Labs  High Sensitivity Troponin:   Recent Labs  Lab 12/02/18 2209 12/03/18 0225  TROPONINIHS 26* 25*     Cardiac EnzymesNo results for input(s): TROPONINI in the last 168 hours. No results for input(s): TROPIPOC in the last 168 hours.  Chemistry Recent Labs  Lab 12/18/18 0409 12/19/18 0339 12/20/18 0415  NA 137 138 139  K 3.9 3.7 3.9  CL 106 103 104  CO2 22 24 24   GLUCOSE 102* 97 104*  BUN 6* <5* 7*  CREATININE 0.93 0.96 0.95  CALCIUM 8.8* 9.0 9.1    GFRNONAA >60 >60 >60  GFRAA >60 >60 >60  ANIONGAP 9 11 11     Hematology Recent Labs  Lab 12/19/18 0339 12/20/18 0415 12/21/18 0450  WBC 4.4 4.4 4.6  RBC 3.29* 3.22* 3.40*  HGB 10.0* 9.8* 10.4*  HCT 31.5* 30.3* 32.4*  MCV 95.7 94.1 95.3  MCH 30.4 30.4 30.6  MCHC 31.7 32.3 32.1  RDW 14.3 14.4 14.5  PLT 454* 415* 382   BNPNo results for input(s): BNP, PROBNP in the last 168 hours.  DDimer No results for input(s): DDIMER in the last 168 hours.   Radiology  US Abdomen Complete  Result Date: 12/20/2018 CLINICAL DATA:  RIGHT upper quadrant pain, has cholecystostomy drain in place, sepsis, rapid atrial fibrillation, hepatitis-C, hypertension EXAM: ABDOMEN ULTRASOUND COMPLETE COMPARISON:  12/11/2018 FINDINGS: Gallbladder: Incompletely distended. Shadowing echogenic material within the gallbladder, likely a combination of previously identified stones, sludge, and indwelling cholecystostomy tube. Gallbladder wall appears mildly thickened. Patient demonstrates a sonographic Murphy sign. No definite pericholecystic fluid. Common bile duct: Diameter: 4 mm, normal Liver: Normal appearance without definite mass or nodularity. Portal vein is patent on color Doppler imaging with normal direction of blood flow towards the liver. IVC: Normal appearance Pancreas: Incompletely visualized due to bowel gas at tail and head. Visualized portion normal appearance Spleen: Normal appearance, 7.1 cm length Right Kidney: Length: 9.0 cm. Normal morphology without mass or hydronephrosis. Left Kidney: Length: 10.3 cm. Normal morphology without mass or hydronephrosis. Abdominal aorta: Normal caliber Other findings: No free fluid IMPRESSION: Shadowing echogenic material within gallbladder likely representing a combination of cholecystostomy tube, previously identified stones, air from tube, and associated sludge. Persistent gallbladder wall thickening and sonographic Murphy sign. Remainder of exam unremarkable.  Electronically Signed   By: Lavonia Dana M.D.   On: 12/20/2018 17:03    Cardiac Studies  TTE 12/07/2018  1. Left ventricular ejection fraction, by visual estimation, is 20%. The left ventricle has severely decreased function. Mildly increased left ventricular size. There is mildly increased left ventricular hypertrophy.  2. Left ventricular diastolic Doppler parameters are indeterminate in the setting of rapid atrial fibrillation.  3. Global right ventricle has mildly reduced systolic function.The right ventricular size is normal. No increase in right ventricular wall thickness.  4. Left atrial size was mild-moderately dilated.  5. Right atrial size was normal.  6. Trivial pericardial effusion is present.  7. The pericardial effusion is posterior to the left ventricle.  8. Mild aortic valve annular calcification.  9. The mitral valve is grossly normal. Mild  mitral valve regurgitation. 10. The tricuspid valve is grossly normal. Tricuspid valve regurgitation mild-moderate. 11. The aortic valve is tricuspid Aortic valve regurgitation is mild to moderate by color flow Doppler. 12. The pulmonic valve was grossly normal. Pulmonic valve regurgitation is mild by color flow Doppler. 13. Moderately elevated pulmonary artery systolic pressure. 14. The inferior vena cava is normal in size with greater than 50% respiratory variability, suggesting right atrial pressure of 3 mmHg. 15. A device wire is visualized. 16. The tricuspid regurgitant velocity is 3.68 m/s, and with an assumed right atrial pressure of 3 mmHg, the estimated right ventricular systolic pressure is moderately elevated at 57.2 mmHg. 17. No obvious vegetations involving valves or device wire.  Patient Profile  JAKWON HACKE is a 68 y.o. male with history of ischemic cardiomyopathy (EF 15 to 20%), CKD, hypertension, atrial fibrillation, hemorrhagic stroke who was admitted with A. fib with RVR secondary to sepsis secondary to aspiration  pneumonia and acute cholecystitis.  He is status post IR placement of percutaneous drainage of gallbladder.  Assessment & Plan  1.  Systolic heart failure, ejection fraction 15 to 20% -Euvolemic on examination and no major complaints. -We will continue his metoprolol succinate 100 mg daily, Entresto 49/51 twice daily, and Aldactone 25 mg daily -Aldactone was added yesterday, and we can see how he does on this today and likely increase his Entresto to max dose therapy tomorrow -Continue 40 mg of Lasix daily  2.  Atrial fibrillation, rate controlled -Had a cardioversion in September of this year and that course was complicated by a hemorrhagic stroke for which anticoagulation was held -CTs have been stable and he was rechallenged on [name] 150 mg twice daily -We will plan for continued rate control on metoprolol succinate 100 mg daily as well as [name] 150 mg twice daily for stroke prophylaxis  3.  Frequent PVCs -Have put in for repeat BMP today to ensure his potassium is okay    For questions or updates, please contact [place] Please consult [email or web address] for contact info under   Signed, [place] T. Audie Box, [place]  12/22/2018 8:35 AM

## 2018-12-22 NOTE — Progress Notes (Addendum)
PROGRESS NOTE    Glenn [name]   [id number] DOB: 06/19/1950 DOA: 12/02/2018  From CIR PCP: [name], MD   Hospital Summary  68 year old gentleman prior history of atrial fibrillation on Eliquis, ischemic cardiomyopathy with left ventricular ejection fraction of 15 to 20% s/p ICD and cardioversion on September 2020, stage III CKD hypertension, hyperlipidemia, recent hospitalization for right MCA subacute hemorrhagic CVA 11/10/18 till 12/02/18 and subsequent discharge to CIR is back with atrial fibrillation with RVR and sepsis secondary to aspiration pneumonia and acute cholecystitis, possible acute on chronic diastolic heart failure and extensive DVT with IVC placement. Currently patient is being followed by cardiology, surgery. For his acute cholecystitis in view of multiple medical problems patient is not a surgical candidate hence he underwent percutaneous cholecystostomy drain placement by IR on 12/13/2018.  Zosyn discontinued after 15 days therapy on 10/29.  Patient had repeat CT head 10/29 with improved findings.  Discussed restarting p.o. anticoagulation with neurology.  Patient started on [name] 150 mg twice daily.     A & P   Active Problems:   Rapid atrial fibrillation ([name])   Acute deep vein thrombosis (DVT) of both lower extremities ([name])   RUQ abdominal pain resolved. RUQ [place] 10/31 similar to prior.  A. fib with RVR  . Continue [name] 150 mg twice daily and monitor for signs/symptoms of bleed . Continue Toprol-XL 100 mg daily . Follow-up with cardiology  Sepsis secondary to pneumonia/aspiration pneumonia and acute cholecystitis resolved. [name] to remain for 8 weeks . Patient will follow up outpatient with IR in 8 weeks, follow-up orders were placed and patient is to record output of drain daily . Flush drain 5 cc sterile saline daily, patient needs prescription for flushes at discharge  Chronic systolic heart failure status post ICD Echo 10/18 with  EF 15 to 20%.  Weight down from admission   Entresto increased this admission . On Toprol-XL 100 mg twice daily . Continue Lasix 40 mg p.o. daily . Entresto increased by cardio today  Extensive DVT of left common femoral vein, left popliteal vein, left proximal profunda vein, left posterior tibial veins, left peroneal veins and left gastrocnemius vein status post IVC filter with chronic left lower extremity dull pain . Continue [name] 150 mg BID  History of MCA hemorrhage stroke with residual left hemiparesis/dysphagia 9/21-10/13 discharge to CIR without new focal deficits at this time.  CT head 10/18 with resolution of intraventricular hemorrhage and expected evolution of basal ganglia hemorrhage and no new hemorrhage present with expected evolution of prominent anterior right MCA territory infarct and volume loss.  CT head 10/29 with some improvement as well.  Discussed with stroke neurology. . SNF placement at discharge   DVT prophylaxis: [name]   Code Status: Full Code  Diet: Dysphagia 2 No family at bedside Disposition Plan: Medically stable for discharge.  Pending SNF placement, awaiting PT/OT reevaluation.  Consultants  . IR . Cardiology  Antibiotics  16 days of Zosyn for acute cholecystitis status post percutaneous drain, discontinued 10/29    Subjective   Patient seen and examined at bedside no acute distress and resting comfortably.  No events overnight.  Tolerating diet.  Denies any chest pain, shortness of breath, nausea, vomiting.Otherwise ROS negative    Objective   Vitals:   12/21/18 1935 12/22/18 0512 12/22/18 1047 12/22/18 1523  BP: 111/69 137/84 135/85 127/77  Pulse: (!) 44 91 79 76  Resp: 20 19    Temp: 97.9 F (36.6 C) 98.2 F (  36.8 C)  98.3 F (36.8 C)  TempSrc: Oral Oral  Oral  SpO2: 100% 96%  99%  Weight:  80.6 kg    Height:        Intake/Output Summary (Last 24 hours) at 12/22/2018 1637 Last data filed at 12/22/2018 1525 Gross per 24 hour   Intake 1205 ml  Output 620 ml  Net 585 ml   Filed Weights   12/20/18 0309 12/21/18 0555 12/22/18 0512  Weight: 78.5 kg 77 kg 80.6 kg    Examination:  Physical Exam Vitals signs and nursing note reviewed.  Constitutional:      Appearance: Normal appearance.  HENT:     Head: Normocephalic and atraumatic.     Mouth/Throat:     Mouth: Mucous membranes are moist.  Eyes:     Extraocular Movements: Extraocular movements intact.  Neck:     Musculoskeletal: Normal range of motion. No neck rigidity.  Cardiovascular:     Rate and Rhythm: Normal rate and regular rhythm.  Pulmonary:     Effort: Pulmonary effort is normal.     Breath sounds: Normal breath sounds.  Abdominal:     General: Abdomen is flat.     Palpations: Abdomen is soft.     Comments: Draining RUQ biliary drain without blood.   Musculoskeletal:        General: No swelling.     Comments: At baseline  Neurological:     Mental Status: He is alert. Mental status is at baseline.  Psychiatric:        Mood and Affect: Mood normal.        Behavior: Behavior normal.     Data Reviewed: I have personally reviewed following labs and imaging studies  CBC: Recent Labs  Lab 12/17/18 0428 12/18/18 0409 12/19/18 0339 12/20/18 0415 12/21/18 0450  WBC 4.4 3.9* 4.4 4.4 4.6  HGB 10.0* 9.5* 10.0* 9.8* 10.4*  HCT 30.9* 29.5* 31.5* 30.3* 32.4*  MCV 96.0 95.2 95.7 94.1 95.3  PLT 361 467* 454* 415* [id number]   Basic Metabolic Panel: Recent Labs  Lab 12/17/18 1132 12/18/18 0409 12/19/18 0339 12/20/18 0415 12/22/18 0936  NA 137 137 138 139 136  K 4.0 3.9 3.7 3.9 3.7  CL 103 106 103 104 103  CO2 25 22 24 24 22   GLUCOSE 113* 102* 97 104* 121*  BUN 8 6* <5* 7* 7*  CREATININE 1.04 0.93 0.96 0.95 0.97  CALCIUM 8.8* 8.8* 9.0 9.1 9.1  MG 1.9  --   --   --   --    GFR: Estimated Creatinine Clearance: 76.2 mL/min (by C-G formula based on SCr of 0.97 mg/dL). Liver Function Tests: No results for input(s): AST, ALT, ALKPHOS,  BILITOT, PROT, ALBUMIN in the last 168 hours. No results for input(s): LIPASE, AMYLASE in the last 168 hours. No results for input(s): AMMONIA in the last 168 hours. Coagulation Profile: No results for input(s): INR, PROTIME in the last 168 hours. Cardiac Enzymes: No results for input(s): CKTOTAL, CKMB, CKMBINDEX, TROPONINI in the last 168 hours. BNP (last 3 results) No results for input(s): PROBNP in the last 8760 hours. HbA1C: No results for input(s): HGBA1C in the last 72 hours. CBG: No results for input(s): GLUCAP in the last 168 hours. Lipid Profile: No results for input(s): CHOL, HDL, LDLCALC, TRIG, CHOLHDL, LDLDIRECT in the last 72 hours. Thyroid Function Tests: No results for input(s): TSH, T4TOTAL, FREET4, T3FREE, THYROIDAB in the last 72 hours. Anemia Panel: No results for input(s): VITAMINB12, FOLATE,  FERRITIN, TIBC, IRON, RETICCTPCT in the last 72 hours. Sepsis Labs: No results for input(s): PROCALCITON, LATICACIDVEN in the last 168 hours.  Recent Results (from the past 240 hour(s))  Body fluid culture     Status: None   Collection Time: 12/13/18  1:29 PM   Specimen: BILE; Body Fluid  Result Value Ref Range Status   Specimen Description BILE  Final   Special Requests Normal  Final   Gram Stain   Final    RARE WBC PRESENT,BOTH PMN AND MONONUCLEAR NO ORGANISMS SEEN    Culture   Final    NO GROWTH 3 DAYS Performed at [facility], [address], [place], [place] [address]    Report Status 12/16/2018 FINAL  Final         Radiology Studies: US Abdomen Complete  Result Date: 12/20/2018 CLINICAL DATA:  RIGHT upper quadrant pain, has cholecystostomy drain in place, sepsis, rapid atrial fibrillation, hepatitis-C, hypertension EXAM: ABDOMEN ULTRASOUND COMPLETE COMPARISON:  12/11/2018 FINDINGS: Gallbladder: Incompletely distended. Shadowing echogenic material within the gallbladder, likely a combination of previously identified stones, sludge, and indwelling  cholecystostomy tube. Gallbladder wall appears mildly thickened. Patient demonstrates a sonographic Murphy sign. No definite pericholecystic fluid. Common bile duct: Diameter: 4 mm, normal Liver: Normal appearance without definite mass or nodularity. Portal vein is patent on color Doppler imaging with normal direction of blood flow towards the liver. IVC: Normal appearance Pancreas: Incompletely visualized due to bowel gas at tail and head. Visualized portion normal appearance Spleen: Normal appearance, 7.1 cm length Right Kidney: Length: 9.0 cm. Normal morphology without mass or hydronephrosis. Left Kidney: Length: 10.3 cm. Normal morphology without mass or hydronephrosis. Abdominal aorta: Normal caliber Other findings: No free fluid IMPRESSION: Shadowing echogenic material within gallbladder likely representing a combination of cholecystostomy tube, previously identified stones, air from tube, and associated sludge. Persistent gallbladder wall thickening and sonographic Murphy sign. Remainder of exam unremarkable. Electronically Signed   By: Lavonia Dana M.D.   On: 12/20/2018 17:03        Scheduled Meds: . atorvastatin  20 mg Oral q1800  . chlorhexidine  15 mL Mouth Rinse BID  . Chlorhexidine Gluconate Cloth  6 each Topical Daily  . dabigatran  150 mg Oral Q12H  . diclofenac sodium  2 g Topical QID  . feeding supplement (ENSURE ENLIVE)  237 mL Oral TID BM  . furosemide  40 mg Oral Daily  . gabapentin  300 mg Oral Q8H  . mouth rinse  15 mL Mouth Rinse q12n4p  . metoprolol succinate  100 mg Oral Daily  . multivitamin with minerals  1 tablet Oral Daily  . pantoprazole  40 mg Oral Daily  . saccharomyces boulardii  250 mg Oral BID  . sacubitril-valsartan  1 tablet Oral BID  . sodium chloride flush  5 mL Intracatheter Q8H  . spironolactone  25 mg Oral Daily   Continuous Infusions: . sodium chloride Stopped (12/10/18 1036)     LOS: 20 days    Time spent: 15 minutes    Harold Hedge, DO  [facility] Pager [phone] If 7PM-7AM, please contact night-coverage [email or web address] Password [facility] 12/22/2018, 4:37 PM

## 2018-12-22 NOTE — TOC Progression Note (Signed)
Transition of Care [facility]) - Progression Note    Patient Details  Name: Glenn [name] MRN: [id number] Date of Birth: 1950/10/15  Transition of Care [facility]) CM/SW [place], LCSW Phone Number: 12/22/2018, 3:16 PM  Clinical Narrative:   Insurance needs updated PT note.    Expected Discharge Plan: Skilled Nursing Facility Barriers to Discharge: Ship broker, Continued Medical Work up  Expected Discharge Plan and Services Expected Discharge Plan: [place] In-house Referral: Clinical Social Work   Post Acute Care Choice: [place] Living arrangements for the past 2 months: Single Family Home                 DME Arranged: N/A DME Agency: NA       HH Arranged: NA HH Agency: NA         Social Determinants of Health (SDOH) Interventions    Readmission Risk Interventions No flowsheet data found.

## 2018-12-23 DIAGNOSIS — I493 Ventricular premature depolarization: Secondary | ICD-10-CM

## 2018-12-23 DIAGNOSIS — M25552 Pain in left hip: Secondary | ICD-10-CM

## 2018-12-23 LAB — MAGNESIUM: Magnesium: 1.7 mg/dL (ref 1.7–2.4)

## 2018-12-23 MED ORDER — LIDOCAINE 5 % EX PTCH
1.0000 | MEDICATED_PATCH | CUTANEOUS | Status: DC
  Administered 2018-12-23 – 2018-12-24 (×2): 1 via TRANSDERMAL
  Filled 2018-12-23 (×2): qty 1

## 2018-12-23 MED ORDER — ACETAMINOPHEN 500 MG PO TABS: 1000.0000 mg | ORAL_TABLET | Freq: Once | ORAL | Status: DC

## 2018-12-23 MED ORDER — ACETAMINOPHEN 500 MG PO TABS
1000.0000 mg | ORAL_TABLET | Freq: Two times a day (BID) | ORAL | Status: DC
  Administered 2018-12-23 – 2018-12-24 (×3): 1000 mg via ORAL
  Filled 2018-12-23 (×3): qty 2

## 2018-12-23 NOTE — Progress Notes (Signed)
PROGRESS NOTE    Glenn [name]   [id number] DOB: 05-09-1950 DOA: 12/02/2018  From CIR PCP: [name], MD   Hospital Summary  68 year old gentleman prior history of atrial fibrillation on Eliquis, ischemic cardiomyopathy with left ventricular ejection fraction of 15 to 20% s/p ICD and cardioversion on September 2020, stage III CKD hypertension, hyperlipidemia, recent hospitalization for right MCA subacute hemorrhagic CVA 11/10/18 till 12/02/18 and subsequent discharge to CIR is back with atrial fibrillation with RVR and sepsis secondary to aspiration pneumonia and acute cholecystitis, possible acute on chronic diastolic heart failure and extensive DVT with IVC placement. Currently patient is being followed by cardiology, surgery. For his acute cholecystitis in view of multiple medical problems patient is not a surgical candidate hence he underwent percutaneous cholecystostomy drain placement by IR on 12/13/2018.  Zosyn discontinued after 15 days therapy on 10/29.  Patient had repeat CT head 10/29 with improved findings.  Discussed restarting p.o. anticoagulation with neurology.  Patient started on [name] 150 mg twice daily.     A & P   Active Problems:   Rapid atrial fibrillation ([name])   Acute deep vein thrombosis (DVT) of both lower extremities ([name])   RUQ abdominal pain s/p IR guided cholecystotomy drain 10/24 resolved. Afebrile without leukocytosis. RUQ [place] 10/31 similar to prior. Dressing changed today.   A. fib with RVR  . Continue [name] 150 mg twice daily and monitor for signs/symptoms of bleed . Continue Toprol-XL 100 mg daily . Follow-up with cardiology  Sepsis secondary to pneumonia/aspiration pneumonia and acute cholecystitis resolved. [name] to remain for 8 weeks . Patient will follow up outpatient with IR in 8 weeks, follow-up orders were placed and patient is to record output of drain daily . Flush drain 5 cc sterile saline daily, patient needs  prescription for flushes at discharge  Chronic systolic heart failure status post ICD Echo 10/18 with EF 15 to 20%. Cardio signed off today. . On Toprol-XL 100 mg twice daily . Continue Lasix 40 mg p.o. daily . Continue current dose of Entresto 49/51 bid . Continue Spironoloactone  Extensive DVT of left common femoral vein, left popliteal vein, left proximal profunda vein, left posterior tibial veins, left peroneal veins and left gastrocnemius vein status post IVC filter with chronic left lower extremity dull pain . Continue [name] 150 mg BID . Schedule Tylenol  . Lidoderm patch to hip/buttock  History of MCA hemorrhage stroke with residual left hemiparesis/dysphagia 9/21-10/13 discharge to CIR without new focal deficits at this time.  CT head 10/18 with resolution of intraventricular hemorrhage and expected evolution of basal ganglia hemorrhage and no new hemorrhage present with expected evolution of prominent anterior right MCA territory infarct and volume loss.  CT head 10/29 with some improvement as well.  Discussed with stroke neurology - ok to start pradaxa. No signs of bleeding . SNF placement at discharge   DVT prophylaxis: [name]   Code Status: Full Code  Diet: Dysphagia 2 No family at bedside Disposition Plan: Medically stable for discharge, reevaluated by PT today and recommending SNF at discharge. Anticipate discharge once bed available  Consultants  . IR . Cardiology  Antibiotics  16 days of Zosyn for acute cholecystitis status post percutaneous drain, discontinued 10/29    Subjective   Patient complaining of persistent dull left buttock/leg pain as well as tenderness of RUQ skin. Dressing changed today without any reported concerning findings.   Otherwise patient denies any complaints at this time.     Objective  Vitals:   12/22/18 1047 12/22/18 1523 12/22/18 2039 12/23/18 0505  BP: 135/85 127/77 (!) 143/84 (!) 112/48  Pulse: 79 76 76   Resp:    20   Temp:  98.3 F (36.8 C) 99.2 F (37.3 C) 98.8 F (37.1 C)  TempSrc:  Oral Oral Oral  SpO2:  99% 98% 98%  Weight:      Height:        Intake/Output Summary (Last 24 hours) at 12/23/2018 1122 Last data filed at 12/23/2018 0945 Gross per 24 hour  Intake 720 ml  Output 1300 ml  Net -580 ml   Filed Weights   12/20/18 0309 12/21/18 0555 12/22/18 0512  Weight: 78.5 kg 77 kg 80.6 kg    Examination:  Physical Exam Vitals signs and nursing note reviewed.  Constitutional:      Appearance: Normal appearance.  HENT:     Head: Normocephalic and atraumatic.     Mouth/Throat:     Mouth: Mucous membranes are moist.  Eyes:     Extraocular Movements: Extraocular movements intact.  Neck:     Musculoskeletal: Normal range of motion. No neck rigidity.  Cardiovascular:     Rate and Rhythm: Normal rate and regular rhythm.  Pulmonary:     Effort: Pulmonary effort is normal.     Breath sounds: Normal breath sounds.  Abdominal:     General: Abdomen is flat.     Palpations: Abdomen is soft.     Comments: RUQ surgical dressing clean dry and intact. No surrounding erythema  Musculoskeletal:     Comments: Left hip without signs of swelling. Minimal tenderness to palpation.  Neurological:     Mental Status: He is alert. Mental status is at baseline.     Comments: LUE weakness  Psychiatric:        Mood and Affect: Mood normal.        Behavior: Behavior normal.     Data Reviewed: I have personally reviewed following labs and imaging studies  CBC: Recent Labs  Lab 12/17/18 0428 12/18/18 0409 12/19/18 0339 12/20/18 0415 12/21/18 0450  WBC 4.4 3.9* 4.4 4.4 4.6  HGB 10.0* 9.5* 10.0* 9.8* 10.4*  HCT 30.9* 29.5* 31.5* 30.3* 32.4*  MCV 96.0 95.2 95.7 94.1 95.3  PLT 361 467* 454* 415* [id number]   Basic Metabolic Panel: Recent Labs  Lab 12/17/18 1132 12/18/18 0409 12/19/18 0339 12/20/18 0415 12/22/18 0936  NA 137 137 138 139 136  K 4.0 3.9 3.7 3.9 3.7  CL 103 106 103 104 103  CO2 25  22 24 24 22   GLUCOSE 113* 102* 97 104* 121*  BUN 8 6* <5* 7* 7*  CREATININE 1.04 0.93 0.96 0.95 0.97  CALCIUM 8.8* 8.8* 9.0 9.1 9.1  MG 1.9  --   --   --   --    GFR: Estimated Creatinine Clearance: 76.2 mL/min (by C-G formula based on SCr of 0.97 mg/dL). Liver Function Tests: No results for input(s): AST, ALT, ALKPHOS, BILITOT, PROT, ALBUMIN in the last 168 hours. No results for input(s): LIPASE, AMYLASE in the last 168 hours. No results for input(s): AMMONIA in the last 168 hours. Coagulation Profile: No results for input(s): INR, PROTIME in the last 168 hours. Cardiac Enzymes: No results for input(s): CKTOTAL, CKMB, CKMBINDEX, TROPONINI in the last 168 hours. BNP (last 3 results) No results for input(s): PROBNP in the last 8760 hours. HbA1C: No results for input(s): HGBA1C in the last 72 hours. CBG: No results for input(s): GLUCAP in  the last 168 hours. Lipid Profile: No results for input(s): CHOL, HDL, LDLCALC, TRIG, CHOLHDL, LDLDIRECT in the last 72 hours. Thyroid Function Tests: No results for input(s): TSH, T4TOTAL, FREET4, T3FREE, THYROIDAB in the last 72 hours. Anemia Panel: No results for input(s): VITAMINB12, FOLATE, FERRITIN, TIBC, IRON, RETICCTPCT in the last 72 hours. Sepsis Labs: No results for input(s): PROCALCITON, LATICACIDVEN in the last 168 hours.  Recent Results (from the past 240 hour(s))  Body fluid culture     Status: None   Collection Time: 12/13/18  1:29 PM   Specimen: BILE; Body Fluid  Result Value Ref Range Status   Specimen Description BILE  Final   Special Requests Normal  Final   Gram Stain   Final    RARE WBC PRESENT,BOTH PMN AND MONONUCLEAR NO ORGANISMS SEEN    Culture   Final    NO GROWTH 3 DAYS Performed at [facility], [address] [place], [place] [address]    Report Status 12/16/2018 FINAL  Final         Radiology Studies: No results found.      Scheduled Meds: . acetaminophen  1,000 mg Oral Once  .  atorvastatin  20 mg Oral q1800  . chlorhexidine  15 mL Mouth Rinse BID  . Chlorhexidine Gluconate Cloth  6 each Topical Daily  . dabigatran  150 mg Oral Q12H  . diclofenac sodium  2 g Topical QID  . feeding supplement (ENSURE ENLIVE)  237 mL Oral TID BM  . furosemide  40 mg Oral Daily  . gabapentin  300 mg Oral Q8H  . lidocaine  1 patch Transdermal Q24H  . mouth rinse  15 mL Mouth Rinse q12n4p  . metoprolol succinate  100 mg Oral Daily  . multivitamin with minerals  1 tablet Oral Daily  . pantoprazole  40 mg Oral Daily  . saccharomyces boulardii  250 mg Oral BID  . sacubitril-valsartan  1 tablet Oral BID  . sodium chloride flush  5 mL Intracatheter Q8H  . spironolactone  25 mg Oral Daily   Continuous Infusions: . sodium chloride Stopped (12/10/18 1036)     LOS: 21 days    Time spent: 20 minutes    Harold Hedge, DO [facility] Pager [phone] If 7PM-7AM, please contact night-coverage [email or web address] Password [email or web address] 12/23/2018, 11:22 AM

## 2018-12-23 NOTE — Progress Notes (Signed)
Cardiology Progress Note  Patient ID: Glenn [name] MRN: [id number] DOB: October 09, 1950 Date of Encounter: 12/23/2018  Primary Cardiologist: Virl Axe, MD  Subjective  No issues overnight.  Still having frequent PVCs.  No complaints this morning, working with physical therapy.  ROS:  All other ROS reviewed and negative. Pertinent positives noted in the HPI.     Inpatient Medications  Scheduled Meds: . atorvastatin  20 mg Oral q1800  . chlorhexidine  15 mL Mouth Rinse BID  . Chlorhexidine Gluconate Cloth  6 each Topical Daily  . dabigatran  150 mg Oral Q12H  . diclofenac sodium  2 g Topical QID  . feeding supplement (ENSURE ENLIVE)  237 mL Oral TID BM  . furosemide  40 mg Oral Daily  . gabapentin  300 mg Oral Q8H  . mouth rinse  15 mL Mouth Rinse q12n4p  . metoprolol succinate  100 mg Oral Daily  . multivitamin with minerals  1 tablet Oral Daily  . pantoprazole  40 mg Oral Daily  . saccharomyces boulardii  250 mg Oral BID  . sacubitril-valsartan  1 tablet Oral BID  . sodium chloride flush  5 mL Intracatheter Q8H  . spironolactone  25 mg Oral Daily   Continuous Infusions: . sodium chloride Stopped (12/10/18 1036)   PRN Meds: sodium chloride, acetaminophen, bisacodyl, calcium carbonate, ipratropium, levalbuterol, metoprolol tartrate   Vital Signs   Vitals:   12/22/18 1047 12/22/18 1523 12/22/18 2039 12/23/18 0505  BP: 135/85 127/77 (!) 143/84 (!) 112/48  Pulse: 79 76 76   Resp:    20  Temp:  98.3 F (36.8 C) 99.2 F (37.3 C) 98.8 F (37.1 C)  TempSrc:  Oral Oral Oral  SpO2:  99% 98% 98%  Weight:      Height:        Intake/Output Summary (Last 24 hours) at 12/23/2018 0936 Last data filed at 12/22/2018 2000 Gross per 24 hour  Intake 720 ml  Output 850 ml  Net -130 ml   Last 3 Weights 12/22/2018 12/21/2018 12/20/2018  Weight (lbs) 177 lb 12.8 oz 169 lb 12.8 oz 173 lb 1 oz  Weight (kg) 80.65 kg 77.021 kg 78.5 kg      Telemetry  Overnight telemetry shows  atrial fibrillation with heart rate in the 70 to low 100 range with frequent PVCs, which I personally reviewed.  Physical Exam   Vitals:   12/22/18 1047 12/22/18 1523 12/22/18 2039 12/23/18 0505  BP: 135/85 127/77 (!) 143/84 (!) 112/48  Pulse: 79 76 76   Resp:    20  Temp:  98.3 F (36.8 C) 99.2 F (37.3 C) 98.8 F (37.1 C)  TempSrc:  Oral Oral Oral  SpO2:  99% 98% 98%  Weight:      Height:         Intake/Output Summary (Last 24 hours) at 12/23/2018 0936 Last data filed at 12/22/2018 2000 Gross per 24 hour  Intake 720 ml  Output 850 ml  Net -130 ml    Last 3 Weights 12/22/2018 12/21/2018 12/20/2018  Weight (lbs) 177 lb 12.8 oz 169 lb 12.8 oz 173 lb 1 oz  Weight (kg) 80.65 kg 77.021 kg 78.5 kg    Body mass index is 27.85 kg/m.  General: Well-appearing, no acute distress Head: Atraumatic, normal size  Eyes: PEERLA, EOMI  Neck: Supple, JVD noted at the clavicle level Endocrine: No thryomegaly Cardiac: Irregular rhythm Lungs: Clear to auscultation bilaterally, no wheezing, rhonchi or rales  Abd: Soft, nontender, no  hepatomegaly  Ext: No edema, pulses 2+ Musculoskeletal: No deformities, BUE and BLE strength normal and equal Skin: Warm and dry, no rashes   Neuro: Alert and oriented to person, place, time, and situation, CNII-XII grossly intact, no focal deficits  Psych: Normal mood and affect   Labs  High Sensitivity Troponin:   Recent Labs  Lab 12/02/18 2209 12/03/18 0225  TROPONINIHS 26* 25*     Cardiac EnzymesNo results for input(s): TROPONINI in the last 168 hours. No results for input(s): TROPIPOC in the last 168 hours.  Chemistry Recent Labs  Lab 12/19/18 0339 12/20/18 0415 12/22/18 0936  NA 138 139 136  K 3.7 3.9 3.7  CL 103 104 103  CO2 24 24 22   GLUCOSE 97 104* 121*  BUN <5* 7* 7*  CREATININE 0.96 0.95 0.97  CALCIUM 9.0 9.1 9.1  GFRNONAA >60 >60 >60  GFRAA >60 >60 >60  ANIONGAP 11 11 11     Hematology Recent Labs  Lab 12/19/18 0339 12/20/18  0415 12/21/18 0450  WBC 4.4 4.4 4.6  RBC 3.29* 3.22* 3.40*  HGB 10.0* 9.8* 10.4*  HCT 31.5* 30.3* 32.4*  MCV 95.7 94.1 95.3  MCH 30.4 30.4 30.6  MCHC 31.7 32.3 32.1  RDW 14.3 14.4 14.5  PLT 454* 415* 382   BNPNo results for input(s): BNP, PROBNP in the last 168 hours.  DDimer No results for input(s): DDIMER in the last 168 hours.   Radiology  No results found.  Cardiac Studies  TTE 12/07/2018  1. Left ventricular ejection fraction, by visual estimation, is 20%. The left ventricle has severely decreased function. Mildly increased left ventricular size. There is mildly increased left ventricular hypertrophy.  2. Left ventricular diastolic Doppler parameters are indeterminate in the setting of rapid atrial fibrillation.  3. Global right ventricle has mildly reduced systolic function.The right ventricular size is normal. No increase in right ventricular wall thickness.  4. Left atrial size was mild-moderately dilated.  5. Right atrial size was normal.  6. Trivial pericardial effusion is present.  7. The pericardial effusion is posterior to the left ventricle.  8. Mild aortic valve annular calcification.  9. The mitral valve is grossly normal. Mild mitral valve regurgitation. 10. The tricuspid valve is grossly normal. Tricuspid valve regurgitation mild-moderate. 11. The aortic valve is tricuspid Aortic valve regurgitation is mild to moderate by color flow Doppler. 12. The pulmonic valve was grossly normal. Pulmonic valve regurgitation is mild by color flow Doppler. 13. Moderately elevated pulmonary artery systolic pressure. 14. The inferior vena cava is normal in size with greater than 50% respiratory variability, suggesting right atrial pressure of 3 mmHg. 15. A device wire is visualized. 16. The tricuspid regurgitant velocity is 3.68 m/s, and with an assumed right atrial pressure of 3 mmHg, the estimated right ventricular systolic pressure is moderately elevated at 57.2 mmHg. 17. No  obvious vegetations involving valves or device wire.  Patient Profile  Glenn [name] is a 68 y.o. male with history of ischemic cardiomyopathy (EF 15 to 20%), CKD, hypertension, atrial fibrillation, hemorrhagic stroke who was admitted with A. fib with RVR secondary to sepsis secondary to aspiration pneumonia and acute cholecystitis.  He is status post IR placement of percutaneous drainage of gallbladder.  Assessment & Plan  1.  Systolic heart failure, ejection fraction 15 to 20% -Euvolemic on examination and no major complaints. -We will continue his metoprolol succinate 100 mg daily, Entresto 49/51 twice daily, and Aldactone 25 mg daily -Tolerating Entresto at current dose well  and Aldactone.  I think we will just leave this alone for now. -Continue 40 mg of Lasix daily  2.  Atrial fibrillation, rate controlled -Had a cardioversion in September of this year and that course was complicated by a hemorrhagic stroke for which anticoagulation was held -CTs have been stable and he was rechallenged on [name] 150 mg twice daily -We will plan for continued rate control on metoprolol succinate 100 mg daily as well as [name] 150 mg twice daily for stroke prophylaxis  3.  Frequent PVCs -I have added on a magnesium today we will need to check this to make sure it is stable.  Potassium looks good.  [facility] will sign off.   Medication Recommendations: Continue current medications as above Other recommendations (labs, testing, etc): None Follow up as an outpatient: Please contact us once the patient is close to discharge and we will arrange a follow-up appointment with his primary cardiologist.  Signed, Addison Naegeli. Audie Box, [place]  12/23/2018 9:35 AM

## 2018-12-23 NOTE — Progress Notes (Signed)
Physical Therapy Treatment Patient Details Name: Glenn [name] MRN: [id number] DOB: Oct 07, 1950 Today's Date: 12/23/2018    History of Present Illness Pt is a 68 y/o male admitted from CIR secondary to a fib with RVR. Also found to have DVT on LLE and is s/p IVC filter placement. Pt also developed acute cholecystitis and had drain placed in IR on 10/25. 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 PMH includes a fib, CHF, hep C, HTN, and s/p ICD.     PT Comments    Steady progress. Continue to recommend SNF.    Follow Up Recommendations  SNF;Supervision/Assistance - 24 hour     Equipment Recommendations  Other (comment)(TBD)    Recommendations for Other Services       Precautions / Restrictions Precautions Precautions: Fall Precaution Comments: L hemi Restrictions Weight Bearing Restrictions: No    Mobility  Bed Mobility Overal bed mobility: Needs Assistance Bed Mobility: Sit to Supine       Sit to supine: Min assist   General bed mobility comments: Assist to bring legs back up onto bed  Transfers Overall transfer level: Needs assistance Equipment used: Ambulation equipment used Transfers: Sit to/from Stand Sit to Stand: Mod assist         General transfer comment: Assist to bring hips up and for balance. Cues to extend trunk, lt hip and knee. Chair to bsc to bed using Stedy.  Ambulation/Gait                 Stairs             Wheelchair Mobility    Modified Rankin (Stroke Patients Only) Modified Rankin (Stroke Patients Only) Pre-Morbid Rankin Score: No symptoms Modified Rankin: Severe disability     Balance Overall balance assessment: Needs assistance Sitting-balance support: Feet supported;Single extremity supported Sitting balance-Leahy Scale: Poor Sitting balance - Comments: UE support to maintain sitting   Standing balance support: During functional activity;Single extremity supported Standing balance-Leahy Scale:  Poor Standing balance comment: Stedy and min assist for static standing                            Cognition Arousal/Alertness: Awake/alert Behavior During Therapy: WFL for tasks assessed/performed Overall Cognitive Status: No family/caregiver present to determine baseline cognitive functioning Area of Impairment: Attention;Memory;Following commands;Safety/judgement;Problem solving                   Current Attention Level: Sustained Memory: Decreased recall of precautions;Decreased short-term memory Following Commands: Follows one step commands consistently Safety/Judgement: Decreased awareness of safety;Decreased awareness of deficits   Problem Solving: Difficulty sequencing;Requires verbal cues;Requires tactile cues        Exercises      General Comments        Pertinent Vitals/Pain Pain Assessment: Faces Faces Pain Scale: No hurt    Home Living                      Prior Function            PT Goals (current goals can now be found in the care plan section) Progress towards PT goals: Progressing toward goals    Frequency    Min 2X/week      PT Plan Current plan remains appropriate    Co-evaluation              AM-PAC PT "6 Clicks" Mobility   Outcome Measure  Help needed turning from your back to your side while in a flat bed without using bedrails?: A Lot Help needed moving from lying on your back to sitting on the side of a flat bed without using bedrails?: A Lot Help needed moving to and from a bed to a chair (including a wheelchair)?: Total Help needed standing up from a chair using your arms (e.g., wheelchair or bedside chair)?: A Lot Help needed to walk in hospital room?: Total Help needed climbing 3-5 steps with a railing? : Total 6 Click Score: 9    End of Session Equipment Utilized During Treatment: Gait belt Activity Tolerance: Patient tolerated treatment well Patient left: with call bell/phone within  reach;in chair;with chair alarm set Nurse Communication: Mobility status;Need for lift equipment PT Visit Diagnosis: Other abnormalities of gait and mobility (R26.89);Hemiplegia and hemiparesis;Difficulty in walking, not elsewhere classified (R26.2) Hemiplegia - Right/Left: Left Hemiplegia - dominant/non-dominant: Non-dominant Hemiplegia - caused by: Cerebral infarction;Nontraumatic intracerebral hemorrhage     Time: 1210-1225 PT Time Calculation (min) (ACUTE ONLY): 15 min  Charges:  $Therapeutic Activity: 8-22 mins                     [place] Pager [phone] Office [place] 12/23/2018, 3:27 PM

## 2018-12-23 NOTE — Progress Notes (Signed)
Physical Therapy Treatment Patient Details Name: Glenn [name] MRN: [id number] DOB: 17-Dec-1950 Today's Date: 12/23/2018    History of Present Illness Pt is a 68 y/o male admitted from [facility] secondary to a fib with RVR. Also found to have DVT on LLE and is s/p IVC filter placement. Pt also developed acute cholecystitis and had drain placed in IR on 10/25. 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 PMH includes a fib, CHF, hep C, HTN, and s/p ICD.     PT Comments    Pt continues to make slow steady progress. Continues to be motivated to work toward more independence. Concentrating on balance and transfers.    Follow Up Recommendations  SNF;Supervision/Assistance - 24 hour     Equipment Recommendations  Other (comment)(TBD)    Recommendations for Other Services       Precautions / Restrictions Precautions Precautions: Fall Precaution Comments: L hemi Restrictions Weight Bearing Restrictions: No    Mobility  Bed Mobility Overal bed mobility: Needs Assistance Bed Mobility: Supine to Sit     Supine to sit: Min assist     General bed mobility comments: Assist to bring LLE off of bed and elevate trunk into sitting. Verbal cues for technique and to bring hips to EOB  Transfers Overall transfer level: Needs assistance Equipment used: Ambulation equipment used;Rolling walker (2 wheeled) Transfers: Sit to/from Stand Sit to Stand: Mod assist;Min assist;From elevated surface         General transfer comment: Min assist to bring hips up when using Stedy and rising from elevated bed. Assist to place and maintain LUE on Stedy. Mod assist to rise from chair with assist to bring hips up and for balance. From chair stood with rolling walker. Verbal/tactile cues to extend lt hip,knee, and trunk. Used Stedy to transfer bed to chair.   Ambulation/Gait                 Stairs             Wheelchair Mobility    Modified Rankin (Stroke Patients Only) Modified  Rankin (Stroke Patients Only) Pre-Morbid Rankin Score: No symptoms Modified Rankin: Severe disability     Balance Overall balance assessment: Needs assistance Sitting-balance support: Feet supported;Single extremity supported Sitting balance-Leahy Scale: Poor Sitting balance - Comments: UE support to maintain sitting   Standing balance support: During functional activity;Single extremity supported Standing balance-Leahy Scale: Poor Standing balance comment: Stedy and min assist for static standing. With walker mod assist for static standing. Stood 4 times for 30 -90 sec with mod assist to maintain. Worked on weight shifting in standing                            Cognition Arousal/Alertness: Awake/alert Behavior During Therapy: WFL for tasks assessed/performed Overall Cognitive Status: No family/caregiver present to determine baseline cognitive functioning Area of Impairment: Attention;Memory;Following commands;Safety/judgement;Problem solving                   Current Attention Level: Sustained Memory: Decreased recall of precautions;Decreased short-term memory Following Commands: Follows one step commands consistently Safety/Judgement: Decreased awareness of safety;Decreased awareness of deficits   Problem Solving: Difficulty sequencing;Requires verbal cues;Requires tactile cues        Exercises      General Comments        Pertinent Vitals/Pain Pain Assessment: Faces Faces Pain Scale: Hurts little more Pain Location: LLE  Pain Descriptors / Indicators: Aching  Pain Intervention(s): Limited activity within patient's tolerance;Monitored during session;Repositioned    Home Living                      Prior Function            PT Goals (current goals can now be found in the care plan section) Progress towards PT goals: Progressing toward goals    Frequency    Min 2X/week      PT Plan Current plan remains appropriate     Co-evaluation              AM-PAC PT "6 Clicks" Mobility   Outcome Measure  Help needed turning from your back to your side while in a flat bed without using bedrails?: A Lot Help needed moving from lying on your back to sitting on the side of a flat bed without using bedrails?: A Lot Help needed moving to and from a bed to a chair (including a wheelchair)?: Total Help needed standing up from a chair using your arms (e.g., wheelchair or bedside chair)?: A Lot Help needed to walk in hospital room?: Total Help needed climbing 3-5 steps with a railing? : Total 6 Click Score: 9    End of Session Equipment Utilized During Treatment: Gait belt Activity Tolerance: Patient tolerated treatment well Patient left: with call bell/phone within reach;in chair;with chair alarm set Nurse Communication: Mobility status;Need for lift equipment PT Visit Diagnosis: Other abnormalities of gait and mobility (R26.89);Hemiplegia and hemiparesis;Difficulty in walking, not elsewhere classified (R26.2) Hemiplegia - Right/Left: Left Hemiplegia - dominant/non-dominant: Non-dominant Hemiplegia - caused by: Cerebral infarction;Nontraumatic intracerebral hemorrhage     Time: 0910-0942 PT Time Calculation (min) (ACUTE ONLY): 32 min  Charges:  $Therapeutic Activity: 23-37 mins                     [place] Pager [phone] Office [place] 12/23/2018, 10:02 AM

## 2018-12-23 NOTE — TOC Progression Note (Signed)
Transition of Care [facility]) - Progression Note    Patient Details  Name: Glenn [name] MRN: [id number] Date of Birth: 1950-10-29  Transition of Care [facility]) CM/SW [place], LCSW Phone Number: 12/23/2018, 2:27 PM  Clinical Narrative:   Updated PT notes sent to [facility] this AM. Still waiting on decision.    Expected Discharge Plan: Skilled Nursing Facility Barriers to Discharge: Ship broker, Continued Medical Work up  Expected Discharge Plan and Services Expected Discharge Plan: [place] In-house Referral: Clinical Social Work   Post Acute Care Choice: [place] Living arrangements for the past 2 months: Single Family Home                 DME Arranged: N/A DME Agency: NA       HH Arranged: NA HH Agency: NA         Social Determinants of Health (SDOH) Interventions    Readmission Risk Interventions No flowsheet data found.

## 2018-12-23 NOTE — TOC Progression Note (Signed)
Transition of Care [facility]) - Progression Note    Patient Details  Name: Glenn [name] MRN: [id number] Date of Birth: 1951/01/25  Transition [facility]) CM/SW [place], LCSW Phone Number: 12/23/2018, 3:41 PM  Clinical Narrative:   Authorization received. However, need updated COVID-19. MD notified.    Expected Discharge Plan: Skilled Nursing Facility Barriers to Discharge: Ship broker, Continued Medical Work up  Expected Discharge Plan and Services Expected Discharge Plan: [place] In-house Referral: Clinical Social Work   Post Acute Care Choice: [place] Living arrangements for the past 2 months: Single Family Home                 DME Arranged: N/A DME Agency: NA       HH Arranged: NA HH Agency: NA         Social Determinants of Health (SDOH) Interventions    Readmission Risk Interventions No flowsheet data found.

## 2018-12-24 DIAGNOSIS — Z09 Encounter for follow-up examination after completed treatment for conditions other than malignant neoplasm: Secondary | ICD-10-CM | POA: Diagnosis not present

## 2018-12-24 DIAGNOSIS — R203 Hyperesthesia: Secondary | ICD-10-CM | POA: Diagnosis not present

## 2018-12-24 DIAGNOSIS — R143 Flatulence: Secondary | ICD-10-CM | POA: Diagnosis not present

## 2018-12-24 DIAGNOSIS — I82402 Acute embolism and thrombosis of unspecified deep veins of left lower extremity: Secondary | ICD-10-CM | POA: Diagnosis not present

## 2018-12-24 DIAGNOSIS — R2681 Unsteadiness on feet: Secondary | ICD-10-CM | POA: Diagnosis not present

## 2018-12-24 DIAGNOSIS — Z95828 Presence of other vascular implants and grafts: Secondary | ICD-10-CM | POA: Diagnosis not present

## 2018-12-24 DIAGNOSIS — M2550 Pain in unspecified joint: Secondary | ICD-10-CM | POA: Diagnosis not present

## 2018-12-24 DIAGNOSIS — I69122 Dysarthria following nontraumatic intracerebral hemorrhage: Secondary | ICD-10-CM | POA: Diagnosis not present

## 2018-12-24 DIAGNOSIS — D62 Acute posthemorrhagic anemia: Secondary | ICD-10-CM | POA: Diagnosis not present

## 2018-12-24 DIAGNOSIS — I4891 Unspecified atrial fibrillation: Secondary | ICD-10-CM | POA: Diagnosis not present

## 2018-12-24 DIAGNOSIS — I69191 Dysphagia following nontraumatic intracerebral hemorrhage: Secondary | ICD-10-CM | POA: Diagnosis not present

## 2018-12-24 DIAGNOSIS — R0789 Other chest pain: Secondary | ICD-10-CM | POA: Diagnosis not present

## 2018-12-24 DIAGNOSIS — I5022 Chronic systolic (congestive) heart failure: Secondary | ICD-10-CM | POA: Diagnosis not present

## 2018-12-24 DIAGNOSIS — U071 COVID-19: Secondary | ICD-10-CM | POA: Diagnosis not present

## 2018-12-24 DIAGNOSIS — Z7401 Bed confinement status: Secondary | ICD-10-CM | POA: Diagnosis not present

## 2018-12-24 DIAGNOSIS — I509 Heart failure, unspecified: Secondary | ICD-10-CM | POA: Diagnosis not present

## 2018-12-24 DIAGNOSIS — J189 Pneumonia, unspecified organism: Secondary | ICD-10-CM | POA: Diagnosis not present

## 2018-12-24 DIAGNOSIS — I619 Nontraumatic intracerebral hemorrhage, unspecified: Secondary | ICD-10-CM | POA: Diagnosis not present

## 2018-12-24 DIAGNOSIS — R2689 Other abnormalities of gait and mobility: Secondary | ICD-10-CM | POA: Diagnosis not present

## 2018-12-24 DIAGNOSIS — K819 Cholecystitis, unspecified: Secondary | ICD-10-CM | POA: Diagnosis not present

## 2018-12-24 DIAGNOSIS — I10 Essential (primary) hypertension: Secondary | ICD-10-CM | POA: Diagnosis not present

## 2018-12-24 DIAGNOSIS — D649 Anemia, unspecified: Secondary | ICD-10-CM | POA: Diagnosis not present

## 2018-12-24 DIAGNOSIS — I82413 Acute embolism and thrombosis of femoral vein, bilateral: Secondary | ICD-10-CM | POA: Diagnosis not present

## 2018-12-24 DIAGNOSIS — R279 Unspecified lack of coordination: Secondary | ICD-10-CM | POA: Diagnosis not present

## 2018-12-24 DIAGNOSIS — J690 Pneumonitis due to inhalation of food and vomit: Secondary | ICD-10-CM | POA: Diagnosis not present

## 2018-12-24 DIAGNOSIS — I69115 Cognitive social or emotional deficit following nontraumatic intracerebral hemorrhage: Secondary | ICD-10-CM | POA: Diagnosis not present

## 2018-12-24 DIAGNOSIS — I959 Hypotension, unspecified: Secondary | ICD-10-CM | POA: Diagnosis not present

## 2018-12-24 DIAGNOSIS — M25512 Pain in left shoulder: Secondary | ICD-10-CM | POA: Diagnosis not present

## 2018-12-24 DIAGNOSIS — M6281 Muscle weakness (generalized): Secondary | ICD-10-CM | POA: Diagnosis not present

## 2018-12-24 LAB — BASIC METABOLIC PANEL
Anion gap: 9 (ref 5–15)
BUN: 10 mg/dL (ref 8–23)
CO2: 25 mmol/L (ref 22–32)
Calcium: 9 mg/dL (ref 8.9–10.3)
Chloride: 105 mmol/L (ref 98–111)
Creatinine, Ser: 0.99 mg/dL (ref 0.61–1.24)
GFR calc Af Amer: >60 mL/min (ref >60)
GFR calc non Af Amer: >60 mL/min (ref >60)
Glucose, Bld: 105 mg/dL — ABNORMAL HIGH (ref 70–99)
Potassium: 3.9 mmol/L (ref 3.5–5.1)
Sodium: 139 mmol/L (ref 135–145)

## 2018-12-24 LAB — CBC
HCT: 31.8 % — ABNORMAL LOW (ref 39.0–52.0)
Hemoglobin: 10.1 g/dL — ABNORMAL LOW (ref 13.0–17.0)
MCH: 30.3 pg (ref 26.0–34.0)
MCHC: 31.8 g/dL (ref 30.0–36.0)
MCV: 95.5 fL (ref 80.0–100.0)
Platelets: 314 10*3/uL (ref 150–400)
RBC: 3.33 MIL/uL — ABNORMAL LOW (ref 4.22–5.81)
RDW: 14.4 % (ref 11.5–15.5)
WBC: 4.5 10*3/uL (ref 4.0–10.5)
nRBC: 0 % (ref 0.0–0.2)

## 2018-12-24 LAB — SARS CORONAVIRUS 2 (TAT 6-24 HRS): SARS Coronavirus 2: NEGATIVE

## 2018-12-24 MED ORDER — SACUBITRIL-VALSARTAN 49-51 MG PO TABS: 1.0000 | ORAL_TABLET | Freq: Two times a day (BID) | ORAL | 1 refills | Status: AC

## 2018-12-24 MED ORDER — GABAPENTIN 300 MG PO CAPS: 300.0000 mg | ORAL_CAPSULE | Freq: Three times a day (TID) | ORAL | 0 refills | Status: DC

## 2018-12-24 MED ORDER — ADULT MULTIVITAMIN W/MINERALS CH: 1.0000 | ORAL_TABLET | Freq: Every day | ORAL | 0 refills | Status: AC

## 2018-12-24 MED ORDER — DABIGATRAN ETEXILATE MESYLATE 150 MG PO CAPS: 150.0000 mg | ORAL_CAPSULE | Freq: Two times a day (BID) | ORAL | 2 refills | Status: DC

## 2018-12-24 MED ORDER — SPIRONOLACTONE 25 MG PO TABS: 25.0000 mg | ORAL_TABLET | Freq: Every day | ORAL | 0 refills | Status: DC

## 2018-12-24 MED ORDER — PANTOPRAZOLE SODIUM 40 MG PO TBEC: 40.0000 mg | DELAYED_RELEASE_TABLET | Freq: Every day | ORAL | 0 refills | Status: DC

## 2018-12-24 MED ORDER — ENSURE ENLIVE PO LIQD: 237.0000 mL | Freq: Three times a day (TID) | ORAL | 12 refills | Status: DC

## 2018-12-24 MED ORDER — LIDOCAINE 5 % EX PTCH: 1.0000 | MEDICATED_PATCH | CUTANEOUS | 0 refills | Status: DC

## 2018-12-24 MED ORDER — SACCHAROMYCES BOULARDII 250 MG PO CAPS: 250.0000 mg | ORAL_CAPSULE | Freq: Two times a day (BID) | ORAL | 0 refills | Status: AC

## 2018-12-24 MED ORDER — METOPROLOL SUCCINATE ER 100 MG PO TB24: 100.0000 mg | ORAL_TABLET | Freq: Every day | ORAL | Status: DC

## 2018-12-24 MED ORDER — CALCIUM CARBONATE ANTACID 500 MG PO CHEW: 1.0000 | CHEWABLE_TABLET | Freq: Every day | ORAL | 1 refills | Status: DC | PRN

## 2018-12-24 MED ORDER — DICLOFENAC SODIUM 1 % TD GEL: 2.0000 g | Freq: Four times a day (QID) | TRANSDERMAL | 1 refills | Status: AC

## 2018-12-24 MED ORDER — IPRATROPIUM BROMIDE 0.02 % IN SOLN: 0.5000 mg | Freq: Four times a day (QID) | RESPIRATORY_TRACT | 12 refills | Status: DC | PRN

## 2018-12-24 MED ORDER — LEVALBUTEROL HCL 0.63 MG/3ML IN NEBU: 0.6300 mg | INHALATION_SOLUTION | Freq: Four times a day (QID) | RESPIRATORY_TRACT | 12 refills | Status: DC | PRN

## 2018-12-24 NOTE — TOC Transition Note (Signed)
Transition of Care [facility]) - CM/SW Discharge Note   Patient Details  Name: Glenn [name] MRN: [id number] Date of Birth: 1950/06/19  Transition of Care [facility]) CM/SW Contact:  Eileen Stanford, LCSW Phone Number: 12/24/2018, 10:42 AM   Clinical Narrative:   Clinical Social Worker facilitated patient discharge including contacting patient family and facility to confirm patient discharge plans.  Clinical information faxed to facility and family agreeable with plan.  CSW arranged ambulance transport via [facility] to [facility]  RN to call [phone] for report prior to discharge.  Final next level of care: Skilled Nursing Facility Barriers to Discharge: No Barriers Identified   Patient Goals and CMS Choice Patient states their goals for this hospitalization and ongoing recovery are:: Pt is agreeable to rehab CMS Medicare.gov Compare Post Acute Care list provided to:: Patient Choice offered to / list presented to : Patient  Discharge Placement              Patient chooses bed at: [facility] Patient to be transferred to facility by: [place] Name of family member notified: pt alert and oriented Patient and family notified of of transfer: 12/24/18  Discharge Plan and Services In-house Referral: Clinical Social Work   Post Acute Care Choice: [place]          DME Arranged: N/A DME Agency: NA       HH Arranged: NA HH Agency: NA        Social Determinants of Health (SDOH) Interventions     Readmission Risk Interventions No flowsheet data found.

## 2018-12-24 NOTE — Progress Notes (Signed)
RN Romelle Starcher from [facility] returned my call and I gave her report on the patient and answered all questions.

## 2018-12-24 NOTE — Care Management Important Message (Signed)
Important Message  Patient Details  Name: GEHRIG SCHIESS MRN: [id number] Date of Birth: [birth year]   Medicare Important Message Given:  Yes     [name] 12/24/2018, 1:27 PM

## 2018-12-24 NOTE — Discharge Summary (Signed)
Physician Discharge Summary [facility]    Patient: Glenn [name]                   Admit date: 12/02/2018   DOB: 07-Aug-1950             Discharge date:12/24/2018/10:19 AM [id number]                          PCP: [name], MD  Disposition: SNF  Recommendations for Outpatient Follow-up:    Follow up: in 2 week  Discharge Condition: Stable   Code Status:   Code Status: Full Code  Diet recommendation: Diabetic diet   Discharge Diagnoses:    Active Problems:   Rapid atrial fibrillation ([name])   Acute deep vein thrombosis (DVT) of both lower extremities ([name])   History of Present Illness/ Hospital Course Kathleen Argue Summary:    Hospital Summary  68 year old gentleman prior history of atrial fibrillation on Eliquis, ischemic cardiomyopathy with left ventricular ejection fraction of 15 to 20% s/p ICD and cardioversion on September 2020, stage III CKD hypertension, hyperlipidemia, recent hospitalization for right MCA subacute hemorrhagic CVA 11/10/18 till 12/02/18 and subsequent discharge to [facility]is back with atrial fibrillation with RVR and sepsis secondary to aspiration pneumonia and acute cholecystitis,possible acute on chronic diastolic heart failure and extensive DVT with IVC placement. Currently patient is being followed by cardiology, surgery. For his acute cholecystitis in view of multiple medical problems patient is not a surgical candidate hence he underwent percutaneous cholecystostomy drain placement by IR on 12/13/2018.  Zosyn discontinued after 15 days therapy on 10/29.  Patient had repeat CT head 10/29 with improved findings.  Discussed restarting p.o. anticoagulation with neurology.  Patient started on [name] 150 mg twice daily.    The patient seen and examined, stable comfortable no acute distress.   Cleared for discharge today 12/24/2018  Detailed discharge summary / A & P   Active Problems:   Rapid atrial fibrillation ([name])   Acute  deep vein thrombosis (DVT) of both lower extremities ([name])   RUQ abdominal pain s/p IR guided cholecystotomy drain 10/24 resolved. Afebrile without leukocytosis. RUQ [place] 10/31 similar to prior. Dressing changed today.   A. fib with RVR   Continue [name] 150 mg twice daily and monitor for signs/symptoms of bleed  Continue Toprol-XL 100 mg daily  Follow-up with cardiology  Sepsis secondary to pneumonia/aspiration pneumonia and acute cholecystitis resolved.  Drain to remain for 8 weeks  Patient will follow up outpatient with IR in 8 weeks, follow-up orders were placed and patient is to record output of drain daily  Flush drain 5 cc sterile saline daily, patient needs prescription for flushes at discharge  Chronic systolic heart failure status post ICD Echo 10/18 with EF 15 to 20%. Cardio signed off today.  On Toprol-XL 100 mg twice daily  Continue Lasix 40 mg p.o. daily  Continue current dose of Entresto 49/51 bid  Continue Spironoloactone  Extensive DVT of left common femoral vein, left popliteal vein, left proximal profunda vein, left posterior tibial veins, left peroneal veins and left gastrocnemius vein status post IVC filter with chronic left lower extremity dull pain  Continue [name] 150 mg BID  Schedule Tylenol   Lidoderm patch to hip/buttock  History of MCA hemorrhage stroke with residual left hemiparesis/dysphagia 9/21-10/13 discharge to CIR without new focal deficits at this time.  CT head 10/18 with resolution of intraventricular hemorrhage and expected evolution of basal ganglia hemorrhage  and no new hemorrhage present with expected evolution of prominent anterior right MCA territory infarct and volume loss.  CT head 10/29 with some improvement as well.  Discussed with stroke neurology - ok to start pradaxa. No signs of bleeding  SNF placement at discharge   DVT prophylaxis: [name]  Code Status: Full Code  Diet: Dysphagia 2 No family at  bedside Disposition Plan: SNF   Consultants   IR  Cardiology  Antibiotics  16 days of Zosyn for acute cholecystitis status post percutaneous drain, discontinued 10/29     Discharge Instructions:   Discharge Instructions    ([place]) Call MD:  Anytime you have any of the following symptoms: 1) 3 pound weight gain in 24 hours or 5 pounds in 1 week 2) shortness of breath, with or without a dry hacking cough 3) swelling in the hands, feet or stomach 4) if you have to sleep on extra pillows at night in order to breathe.   Complete by: As directed    Activity as tolerated - No restrictions   Complete by: As directed    Diet - low sodium heart healthy   Complete by: As directed    Discharge instructions   Complete by: As directed    Per cardiology   Increase activity slowly   Complete by: As directed        Medication List    STOP taking these medications   carvedilol 12.5 MG tablet Commonly known as: COREG   ceFEPIme 2 g in sodium chloride 0.9 % 100 mL   ciprofloxacin 0.3 % ophthalmic solution Commonly known as: CILOXAN   divalproex 125 MG capsule Commonly known as: DEPAKOTE SPRINKLE   modafinil 100 MG tablet Commonly known as: PROVIGIL   sacubitril-valsartan 24-26 MG Commonly known as: ENTRESTO Replaced by: sacubitril-valsartan 49-51 MG   topiramate 25 MG tablet Commonly known as: TOPAMAX     TAKE these medications   acetaminophen 325 MG tablet Commonly known as: TYLENOL Take 2 tablets (650 mg total) by mouth every 4 (four) hours as needed for mild pain (or temp > 37.5 C (99.5 F)).   atorvastatin 20 MG tablet Commonly known as: LIPITOR Take 1 tablet (20 mg total) by mouth daily at 6 PM.   calcium carbonate 500 MG chewable tablet Commonly known as: TUMS - dosed in mg elemental calcium Chew 1 tablet (200 mg of elemental calcium total) by mouth daily as needed for indigestion or heartburn.   dabigatran 150 MG Caps capsule Commonly known  as: [name] Take 1 capsule (150 mg total) by mouth every 12 (twelve) hours.   diclofenac sodium 1 % Gel Commonly known as: VOLTAREN Apply 2 g topically 4 (four) times daily.   feeding supplement (ENSURE ENLIVE) Liqd Take 237 mLs by mouth 3 (three) times daily between meals.   furosemide 40 MG tablet Commonly known as: LASIX Take 1 tablet (40 mg total) by mouth daily.   gabapentin 300 MG capsule Commonly known as: NEURONTIN Take 1 capsule (300 mg total) by mouth every 8 (eight) hours. What changed: See the new instructions.   ipratropium 0.02 % nebulizer solution Commonly known as: ATROVENT Take 2.5 mLs (0.5 mg total) by nebulization every 6 (six) hours as needed for wheezing or shortness of breath.   levalbuterol 0.63 MG/3ML nebulizer solution Commonly known as: XOPENEX Take 3 mLs (0.63 mg total) by nebulization every 6 (six) hours as needed for wheezing or shortness of breath.   lidocaine 5 % Commonly known  as: LIDODERM Place 1 patch onto the skin daily. Remove & Discard patch within 12 hours or as directed by MD What changed: Another medication with the same name was removed. Continue taking this medication, and follow the directions you see here.   metoprolol succinate 100 MG 24 hr tablet Commonly known as: TOPROL-XL Take 1 tablet (100 mg total) by mouth daily. Take with or immediately following a meal.   multivitamin with minerals Tabs tablet Take 1 tablet by mouth daily.   pantoprazole 40 MG tablet Commonly known as: PROTONIX Take 1 tablet (40 mg total) by mouth daily.   potassium chloride SA 20 MEQ tablet Commonly known as: KLOR-CON Take 1 tablet (20 mEq total) by mouth daily.   saccharomyces boulardii 250 MG capsule Commonly known as: FLORASTOR Take 1 capsule (250 mg total) by mouth 2 (two) times daily.   sacubitril-valsartan 49-51 MG Commonly known as: ENTRESTO Take 1 tablet by mouth 2 (two) times daily. Replaces: sacubitril-valsartan 24-26 MG    spironolactone 25 MG tablet Commonly known as: ALDACTONE Take 1 tablet (25 mg total) by mouth daily. What changed: how much to take   traMADol 50 MG tablet Commonly known as: ULTRAM Take 1 tablet (50 mg total) by mouth every 8 (eight) hours as needed for severe pain.       Contact information for follow-up providers    Clovis Riley, MD Follow up in 6 week(s).   Specialty: General Surgery Contact information: [address] [place] [address] [phone]        Jacqulynn Cadet, MD Follow up in 8 week(s).   Specialties: Interventional Radiology, Radiology Why: follow up in IR OP Clinic 8 weeks-- pt will hear from scheduler for time and date; record OP daily of drain; flush 5 cc sterile saline daily; call [phone] if any questions/concerns Contact information: [place] [address] [phone]            Contact information for after-discharge care    Destination    [name] .   Service: Skilled Nursing Contact information: [address] [place] [phone]                 Allergies  Allergen Reactions   Benadryl [Diphenhydramine] Palpitations     Procedures /Studies:   Ct Abdomen Pelvis Wo Contrast  Result Date: 12/02/2018 CLINICAL DATA:  Abdominal pain. Fever. Right flank pain. Assess for retroperitoneal bleeding. EXAM: CT ABDOMEN AND PELVIS WITHOUT CONTRAST TECHNIQUE: Multidetector CT imaging of the abdomen and pelvis was performed following the standard protocol without IV contrast. COMPARISON:  None. FINDINGS: Lower chest: Small right pleural effusion layering dependently. Mild atelectasis or infiltrate at the right lung base. Hepatobiliary: Liver parenchyma appears normal. There is vicarious excretion of contrast with opacification of the gallbladder. There are probably some pigment stones within the gallbladder. Pancreas: Normal  Spleen: Normal Adrenals/Urinary Tract: Adrenal glands are normal. Kidneys are normal. Bladder is normal. Stomach/Bowel: No acute or significant bowel finding. Vascular/Lymphatic: Aortic atherosclerosis. No aneurysm. IVC is normal. No retroperitoneal adenopathy. No retroperitoneal hematoma. Reproductive: Normal Other: None Musculoskeletal: Chronic degenerative changes of the lumbar spine. IMPRESSION: 1. No evidence of retroperitoneal hematoma. 2. Small right pleural effusion layering dependently. Mild atelectasis or infiltrate at the right lung base. 3. Aortic atherosclerosis. 4. Gallstones in the gallbladder but no CT evidence of cholecystitis. 5. No intraperitoneal abscess.  No acute bowel pathology evident. Aortic Atherosclerosis (ICD10-I70.0). Electronically  Signed   By: Nelson Chimes M.D.   On: 12/02/2018 19:52   Dg Chest 2 View  Result Date: 12/02/2018 CLINICAL DATA:  Shortness of breath EXAM: CHEST - 2 VIEW COMPARISON:  11/27/2018 FINDINGS: Left AICD remains in place, unchanged. Cardiomegaly. No confluent opacities, effusions or edema. No acute bony abnormality. IMPRESSION: Cardiomegaly.  No active disease. Electronically Signed   By: Rolm Baptise M.D.   On: 12/02/2018 16:31   Dg Chest 2 View  Result Date: 11/27/2018 CLINICAL DATA:  Fluid overload smoker history of CHF EXAM: CHEST - 2 VIEW COMPARISON:  11/25/2018 FINDINGS: Left-sided pacing device as before. Cardiomegaly. No significant pleural effusion. Aortic atherosclerosis. Streaky atelectasis left base. IMPRESSION: Cardiomegaly without overt failure or significant pleural effusion. Streaky atelectasis left base. Electronically Signed   By: Donavan Foil M.D.   On: 11/27/2018 21:27   Dg Chest 2 View  Result Date: 11/25/2018 CLINICAL DATA:  Intermittent fever. EXAM: CHEST - 2 VIEW COMPARISON:  11/17/2018 FINDINGS: Lordotic technique demonstrated. Left-sided pacemaker unchanged. Lungs are adequately inflated and otherwise clear. Stable  cardiomegaly. Remainder of the exam is unchanged. IMPRESSION: No acute cardiopulmonary disease. Stable cardiomegaly. Electronically Signed   By: Marin Olp M.D.   On: 11/25/2018 11:17   Dg Abd 1 View  Result Date: 12/02/2018 CLINICAL DATA:  Right side abdominal pain and nausea for 1 week. EXAM: ABDOMEN - 1 VIEW COMPARISON:  None. FINDINGS: The bowel gas pattern is normal. No radio-opaque calculi or other significant radiographic abnormality are seen. IMPRESSION: Negative exam. Electronically Signed   By: Inge Rise M.D.   On: 12/02/2018 13:09   Ct Head Wo Contrast  Result Date: 12/18/2018 CLINICAL DATA:  Follow-up intracranial hemorrhage. Currently taking heparin. Persistent atrial fibrillation. Persistent fever and respiratory failure. EXAM: CT HEAD WITHOUT CONTRAST TECHNIQUE: . IMPRESSION: 1. Mild further decrease in density of the previously demonstrated large right anterior middle cerebral artery distribution infarct with a small thin rim of residual high density in the right basal ganglia at the posterior margin of the infarction. 2. Stable mild-to-moderate diffuse cerebral and cerebellar atrophy with the exception of a further mild decrease in size of the lateral ventricles. Again, this may be due to progressive volume loss associated with the evolving infarct on the right. An element of developing mild communicating hydrocephalus cannot be excluded. 3. No acute abnormality. Electronically Signed   By: Claudie Revering M.D.   On: 12/18/2018 16:43   Ct Head Wo Contrast  Result Date: 12/07/2018 CLINICAL DATA:  Stroke, follow-up.  Recent hemorrhagic CVA. EXAM: CT HEAD WITHOUT CONTRAST TECHNIQUE: Contiguous axial images were obtained from the base of the skull through the vertex without intravenous contrast. COMPARISON:  CT head 11/11/2018 MR head 11/12/2018 FINDINGS: Brain: The right frontal MCA territory infarct is again seen. There is evolution of the hemorrhage previously noted in the right  basal ganglia. Fluid levels are no longer present in the lateral ventricles. Mild prominence of the lateral ventricles is likely due to volume loss. Cortical hyperdensity within the infarct territory likely represents cortical laminar necrosis rather than hemorrhage. No new hemorrhage is present. The left hemisphere is within normal limits. The brainstem and cerebellum are within normal limits. Vascular: Atherosclerotic calcifications are present in the cavernous internal carotid arteries bilaterally. There is no hyperdense vessel. Skull: Calvarium is intact. No focal lytic or blastic lesions are present. Sinuses/Orbits: The paranasal sinuses and mastoid air cells are clear. The globes and orbits are within normal limits. IMPRESSION: 1. Expected evolution  of basal ganglia hemorrhage. No hyperdense blood products remain. No new hemorrhage is present. 2. Resolution of intraventricular hemorrhage. 3. Prominent ventricular size likely reflects volume loss of the adjacent parenchyma. 4. Expected evolution of prominent anterior right MCA territory infarct with volume loss. 5. Cortical hyperdensity in the infarct territory likely reflects developing cortical laminar necrosis. No acute hemorrhage is present. Electronically Signed   By: San Morelle M.D.   On: 12/07/2018 17:53   Ct Chest Wo Contrast  Result Date: 12/11/2018 CLINICAL DATA:  Hypoxemia respiratory failure EXAM: CT CHEST WITHOUT CONTRAST TECHNIQUE: Multidetector CT imaging of the chest was performed following the standard protocol without IV contrast. COMPARISON:  Multiple chest x-rays.  Abdominal CT from 9 days ago FINDINGS: Cardiovascular: Cardiomegaly with ICD lead into the right ventricular apex. No pericardial effusion. Aortic and coronary atherosclerosis. Mediastinum/Nodes: Negative for adenopathy or inflammation Lungs/Pleura: Consolidation in the right lower lobe with a degree of volume loss, progressed. Small layering right pleural effusion.  Mild atelectatic opacity in the dependent left lower lobe. No pulmonary edema Upper Abdomen: Fat edema around the gallbladder which likely has a thickened wall. There is cholelithiasis. Musculoskeletal: No acute or aggressive finding IMPRESSION: 1. Right lower lobe pneumonia with superimposed atelectasis, progressed from abdominal CT 9 days ago. Small right pleural effusion that is layering. 2. Gallstones and new pericholecystic edema, please correlate for symptoms of cholecystitis. 3. Cardiomegaly without edema. Electronically Signed   By: Monte Fantasia M.D.   On: 12/11/2018 04:08   Nm Hepatobiliary Liver Func  Result Date: 12/12/2018 CLINICAL DATA:  Right upper quadrant pain.  Cholecystitis suspected. EXAM: NUCLEAR MEDICINE HEPATOBILIARY IMAGING TECHNIQUE: Sequential images of the abdomen were obtained out to 60 minutes following intravenous administration of radiopharmaceutical. RADIOPHARMACEUTICALS:  5.47 mCi Tc-40m  Choletec IV COMPARISON:  Ultrasound 12/11/2018 FINDINGS: Prompt uptake and biliary excretion of activity by the liver is seen. Biliary activity passes into small bowel, consistent with patent common bile duct. No gallbladder activity visualized within the first hour. The patient then received 3 mg of morphine the intravenous infusion and subsequent imaging was carried out for an additional 30 minutes. No gallbladder activity noted post morphine. IMPRESSION: 1. Nonvisualization of the gallbladder which may reflect cystic duct obstruction and acute cholecystitis. Electronically Signed   By: Kerby Moors M.D.   On: 12/12/2018 16:39   US Abdomen Complete  Result Date: 12/20/2018 CLINICAL DATA:  RIGHT upper quadrant pain, has cholecystostomy drain in place, sepsis, rapid atrial fibrillation, hepatitis-C, hypertension EXAM: ABDOMEN ULTRASOUND COMPLETE COMPARISON:  12/11/2018 FINDINGS: Gallbladder: Incompletely distended. Shadowing echogenic material within the gallbladder, likely a  combination of previously identified stones, sludge, and indwelling cholecystostomy tube. Gallbladder wall appears mildly thickened. Patient demonstrates a sonographic Murphy sign. No definite pericholecystic fluid. Common bile duct: Diameter: 4 mm, normal Liver: Normal appearance without definite mass or nodularity. Portal vein is patent on color Doppler imaging with normal direction of blood flow towards the liver. IVC: Normal appearance Pancreas: Incompletely visualized due to bowel gas at tail and head. Visualized portion normal appearance Spleen: Normal appearance, 7.1 cm length Right Kidney: Length: 9.0 cm. Normal morphology without mass or hydronephrosis. Left Kidney: Length: 10.3 cm. Normal morphology without mass or hydronephrosis. Abdominal aorta: Normal caliber Other findings: No free fluid IMPRESSION: Shadowing echogenic material within gallbladder likely representing a combination of cholecystostomy tube, previously identified stones, air from tube, and associated sludge. Persistent gallbladder wall thickening and sonographic Murphy sign. Remainder of exam unremarkable. Electronically Signed   By: Elta Guadeloupe  Thornton Papas M.D.   On: 12/20/2018 17:03   Ir Ivc Filter Plmt / S&i /img Guid/mod Sed  Result Date: 12/04/2018 INDICATION: Acute occlusive left lower extremity DVT in the setting of acute hemorrhagic right MCA infarct. Patient cannot be anticoagulated EXAM: ULTRASOUND GUIDANCE FOR VASCULAR ACCESS IVC CATHETERIZATION AND VENOGRAM IVC FILTER INSERTION Date:  12/04/2018 12/04/2018 3:44 pmIMPRESSION: Ultrasound and fluoroscopically guided infrarenal IVC filter insertion. PLAN: This IVC filter is potentially retrievable. The patient will be assessed for filter retrieval by Interventional Radiology in approximately 8-12 weeks. Further recommendations regarding filter retrieval, continued surveillance or declaration of device permanence, will be made at that time. Electronically Signed   By: Jerilynn Mages.  Shick M.D.   On:  12/04/2018 15:53   Ir Perc Cholecystostomy  Result Date: 12/13/2018 INDICATION: 68 year old male with acute calculus cholecystitis. He is currently not an operative candidate and therefore presents for percutaneous cholecystostomy tube placement. EXAM: CHOLECYSTOSTOMY MEDICATIONS: Patient is currently receiving intravenous antibiotics on a IMPRESSION: Successful placement of a percutaneous transhepatic cholecystostomy tube for acute calculus cholecystitis in a poor operative candidate. PLAN: 1. Maintain tube to gravity bag drainage. 2. Follow-up with Interventional Radiology in 4 weeks for through the tube cholangiogram to assess patency of the cystic and common bile ducts. Electronically Signed   By: Jacqulynn Cadet M.D.   On: 12/13/2018 16:08   Dg Chest Port 1 View  Result Date: 12/09/2018 CLINICAL DATA:  Respiratory failure. EXAM: PORTABLE CHEST 1 VIEW COMPARISON:  12/06/2018. FINDINGS: Interim removal of feeding tube. AICD in stable position. Stable cardiomegaly. Persistent right base infiltrate and right pleural effusion. No interim change. No pneumothorax. IMPRESSION: 1.  Interim removal of feeding tube. 2.  AICD in stable position.  Stable cardiomegaly. 3. Persistent right base infiltrate and right-sided pleural effusion. No interim change. Electronically Signed   By: Marcello Moores  Register   On: 12/09/2018 07:00   Dg Chest Port 1 View  Result Date: 12/06/2018 CLINICAL DATA:  Hemoptysis. EXAM: PORTABLE CHEST 1 VIEW COMPARISON:  12/04/2018 FINDINGS: Soft feeding tube enters the stomach. Pacemaker/AICD remains in place. Left chest remains clear. Persistent right effusion with atelectasis of the mid and right lung. Effusion is probably larger than on the previous study. IMPRESSION: Persistent right effusion and atelectasis. The effusion appears larger than on the previous study. No other significant change. Electronically Signed   By: Nelson Chimes M.D.   On: 12/06/2018 10:14   Dg Chest Port 1  View  Result Date: 12/04/2018 CLINICAL DATA:  Right sided abd pain with sob EXAM: PORTABLE CHEST 1 VIEW COMPARISON:  12/02/2018 FINDINGS: Patient has LEFT-sided transvenous pacemaker with lead to the RIGHT ventricle. The heart is enlarged. Study quality is degraded by patient motion artifact.There is increased opacity at the RIGHT lung base which partially obscures the RIGHT hemidiaphragm and is consistent with pleural effusion and atelectasis or infiltrate. IMPRESSION: Increased opacity at the RIGHT lung base consistent with pleural effusion and atelectasis or infiltrate. Electronically Signed   By: [name] M.D.   On: 12/04/2018 11:45   Dg Chest Port 1 View  Result Date: 12/02/2018 CLINICAL DATA:  Respiratory failure EXAM: PORTABLE CHEST 1 VIEW COMPARISON:  12/02/2018, 11/27/2018 FINDINGS: Left-sided pacing device as before. Cardiomegaly with central vascular congestion and aortic atherosclerosis. No consolidation, pleural effusion or pneumothorax. IMPRESSION: Cardiomegaly with central vascular congestion Electronically Signed   By: Donavan Foil M.D.   On: 12/02/2018 22:25   Dg Abd Portable 1v  Result Date: 12/05/2018 CLINICAL DATA:  Encounter for feeding tube  placement EXAM: PORTABLE ABDOMEN - 1 VIEW COMPARISON:  Yesterday FINDINGS: Feeding tube with tip over the proximal stomach. IVC filter in place. Normal bowel gas pattern. IMPRESSION: Feeding tube with tip over the proximal stomach. Electronically Signed   By: Monte Fantasia M.D.   On: 12/05/2018 11:31   Dg Abd Portable 1v  Result Date: 12/04/2018 CLINICAL DATA:  Right side abdominal pain EXAM: PORTABLE ABDOMEN - 1 VIEW COMPARISON:  12/02/2018 FINDINGS: Mild diffuse gaseous distention of bowel may reflect mild ileus. No convincing evidence for bowel obstruction. No organomegaly, suspicious calcification or free air. No acute bony abnormality. IMPRESSION: Mild gaseous distention of bowel diffusely could reflect mild ileus.  Electronically Signed   By: Rolm Baptise M.D.   On: 12/04/2018 11:44   Vas [place] Lower Extremity Venous (dvt)  Result Date: 12/03/2018 - Vas [place] Upper Extremity Venous Duplex  Result Date: 12/06/2018  thrombosis in the upper extremity. Findings consistent with acute superficial vein thrombosis involving the left cephalic vein.  *See table(s) above for measurements and observations.  Diagnosing physician: Harold Barban MD Electronically signed by Harold Barban MD on 12/06/2018 at 12:44:35 AM.    Final    US Abdomen Limited Ruq  Result Date: 12/11/2018 CLINICAL DATA:  Right upper quadrant pain for 8-10 months EXAM: ULTRASOUND ABDOMEN LIMITED RIGHT UPPER QUADRANT COMPARISON:  CT chest 12/11/2018 FINDINGS: Gallbladder: The gallbladder is distended. The wall of the gallbladder is thickened measuring 3.9 mm. There is pericholecystic fluid. Sludge and stones identified within the gallbladder. The largest gallstone measures 2 cm. Negative sonographic Murphy's sign. Common bile duct: Diameter: 5.1 mm Liver: Increased parenchymal echogenicity. No focal liver abnormality. Portal vein is patent on color Doppler imaging with normal direction of blood flow towards the liver. Other: Perihepatic ascites IMPRESSION: 1. Gallstones, gallbladder sludge and wall thickening concerning for acute cholecystitis. 2. Echogenic liver compatible with hepatic steatosis. Electronically Signed   By: Kerby Moors M.D.   On: 12/11/2018 16:44     Subjective:   Patient was seen and examined 12/24/2018, 10:19 AM Patient stable today. No acute distress.  No issues overnight Stable for discharge.  Discharge Exam:    Vitals:   12/23/18 0505 12/23/18 1415 12/23/18 2052 12/24/18 0514  BP: (!) 112/48  (!) 125/57 110/63  Pulse:  71 72 (!) 59  Resp: 20  18 18   Temp: 98.8 F (37.1 C) (!) 97.5 F (36.4 C) 98.1 F (36.7 C) 97.6 F (36.4 C)  TempSrc: Oral Oral Oral Oral  SpO2: 98% 98% 98% 99%  Weight:    75.9 kg  Height:         General: Pt lying comfortably in bed & appears in no obvious distress. Cardiovascular: S1 & S2 heard, RRR, S1/S2 +. No murmurs, rubs, gallops or clicks. No JVD or pedal edema. Respiratory: Clear to auscultation without wheezing, rhonchi or crackles. No increased work of breathing. Abdominal:  Non-distended, non-tender & soft. No organomegaly or masses appreciated. Normal bowel sounds heard. CNS: Alert and oriented. No focal deficits. Extremities: no edema, no cyanosis    The results of significant diagnostics from this hospitalization (including imaging, microbiology, ancillary and laboratory) are listed below for reference.      Microbiology:   Recent Results (from the past 240 hour(s))  SARS CORONAVIRUS 2 (TAT 6-24 HRS) Nasopharyngeal Nasopharyngeal Swab     Status: None   Collection Time: 12/23/18  7:28 PM   Specimen: Nasopharyngeal Swab  Result Value Ref Range Status   SARS Coronavirus 2  NEGATIVE NEGATIVE Final    Comment: (NOTE) SARS-CoV-2 target nucleic acids are NOT DETECTED. The SARS-CoV-2 RNA is generally detectable in upper and lower respiratory specimens during the acute phase of infection. Negative results do not preclude SARS-CoV-2 infection, do not rule out co-infections with other pathogens, and should not be used as the sole basis for treatment or other patient management decisions. Negative results must be combined with clinical observations, patient history, and epidemiological information. The expected result is Negative. Fact Sheet for Patients: SugarRoll.be Fact Sheet for Healthcare Providers: https://www.woods-mathews.com/ This test is not yet approved or cleared by the [place] FDA and  has been authorized for detection and/or diagnosis of SARS-CoV-2 by FDA under an Emergency Use Authorization (EUA). This EUA will remain  in effect (meaning this test can be used) for the duration of the COVID-19  declaration under Section 56 4(b)(1) of the Act, 21 U.S.C. section 360bbb-3(b)(1), unless the authorization is terminated or revoked sooner. Performed at [facility], [place] [address] [place], [place] [address]      Labs:   CBC: Recent Labs  Lab 12/18/18 [phone] 12/19/18 0339 12/20/18 0415 12/21/18 0450 12/24/18 0249  WBC 3.9* 4.4 4.4 4.6 4.5  HGB 9.5* 10.0* 9.8* 10.4* 10.1*  HCT 29.5* 31.5* 30.3* 32.4* 31.8*  MCV 95.2 95.7 94.1 95.3 95.5  PLT 467* 454* 415* 382 [id number]   Basic Metabolic Panel: Recent Labs  Lab 12/17/18 1132 12/18/18 0409 12/19/18 0339 12/20/18 0415 12/22/18 0936 12/23/18 1110 12/24/18 0249  NA 137 137 138 139 136  --  139  K 4.0 3.9 3.7 3.9 3.7  --  3.9  CL 103 106 103 104 103  --  105  CO2 25 22 24 24 22   --  25  GLUCOSE 113* 102* 97 104* 121*  --  105*  BUN 8 6* <5* 7* 7*  --  10  CREATININE 1.04 0.93 0.96 0.95 0.97  --  0.99  CALCIUM 8.8* 8.8* 9.0 9.1 9.1  --  9.0  MG 1.9  --   --   --   --  1.7  --    Liver Function Tests: No results for input(s): AST, ALT, ALKPHOS, BILITOT, PROT, ALBUMIN in the last 168 hours. BNP (last 3 results) Recent Labs    11/28/18 0551 12/02/18 2032 12/05/18 1029  BNP 916.4* 566.2* 450.0*      Component Value Date/Time   COLORURINE AMBER (A) 12/05/2018 0227   APPEARANCEUR HAZY (A) 12/05/2018 0227   LABSPEC 1.040 (H) 12/05/2018 0227   PHURINE 5.0 12/05/2018 0227   GLUCOSEU NEGATIVE 12/05/2018 0227   HGBUR SMALL (A) 12/05/2018 0227   BILIRUBINUR NEGATIVE 12/05/2018 0227   KETONESUR NEGATIVE 12/05/2018 0227   PROTEINUR 100 (A) 12/05/2018 0227   NITRITE NEGATIVE 12/05/2018 0227   LEUKOCYTESUR NEGATIVE 12/05/2018 0227    Time coordinating discharge: Over 55 minutes  SIGNED: Deatra James, MD, FACP, FHM. [facility],  Pager [phone]  If 7PM-7AM, please contact night-coverage [email or web address] 12/24/2018, 10:19 AM

## 2018-12-24 NOTE — Progress Notes (Signed)
RN attempted to call [facility]. Person answered phone and transferred me, but was unable to reach someone to give report as mailbox was full.  Called back and left my number in case they want to call me for report on patient.  Called son and left message that patient is being transported to [facility].

## 2018-12-25 DIAGNOSIS — K819 Cholecystitis, unspecified: Secondary | ICD-10-CM | POA: Diagnosis not present

## 2018-12-25 DIAGNOSIS — J690 Pneumonitis due to inhalation of food and vomit: Secondary | ICD-10-CM | POA: Diagnosis not present

## 2018-12-25 DIAGNOSIS — I82402 Acute embolism and thrombosis of unspecified deep veins of left lower extremity: Secondary | ICD-10-CM | POA: Diagnosis not present

## 2018-12-25 DIAGNOSIS — R203 Hyperesthesia: Secondary | ICD-10-CM | POA: Diagnosis not present

## 2019-01-02 DIAGNOSIS — M25512 Pain in left shoulder: Secondary | ICD-10-CM | POA: Diagnosis not present

## 2019-01-02 DIAGNOSIS — I4891 Unspecified atrial fibrillation: Secondary | ICD-10-CM | POA: Diagnosis not present

## 2019-01-02 DIAGNOSIS — D649 Anemia, unspecified: Secondary | ICD-10-CM | POA: Diagnosis not present

## 2019-01-02 DIAGNOSIS — J189 Pneumonia, unspecified organism: Secondary | ICD-10-CM | POA: Diagnosis not present

## 2019-01-02 DIAGNOSIS — R143 Flatulence: Secondary | ICD-10-CM | POA: Diagnosis not present

## 2019-01-02 DIAGNOSIS — K819 Cholecystitis, unspecified: Secondary | ICD-10-CM | POA: Diagnosis not present

## 2019-01-02 DIAGNOSIS — Z09 Encounter for follow-up examination after completed treatment for conditions other than malignant neoplasm: Secondary | ICD-10-CM | POA: Diagnosis not present

## 2019-01-07 DIAGNOSIS — K819 Cholecystitis, unspecified: Secondary | ICD-10-CM | POA: Diagnosis not present

## 2019-01-07 DIAGNOSIS — D649 Anemia, unspecified: Secondary | ICD-10-CM | POA: Diagnosis not present

## 2019-01-07 DIAGNOSIS — J690 Pneumonitis due to inhalation of food and vomit: Secondary | ICD-10-CM | POA: Diagnosis not present

## 2019-01-14 DIAGNOSIS — J690 Pneumonitis due to inhalation of food and vomit: Secondary | ICD-10-CM | POA: Diagnosis not present

## 2019-01-14 DIAGNOSIS — D649 Anemia, unspecified: Secondary | ICD-10-CM | POA: Diagnosis not present

## 2019-01-14 DIAGNOSIS — R0789 Other chest pain: Secondary | ICD-10-CM | POA: Diagnosis not present

## 2019-01-21 DIAGNOSIS — K819 Cholecystitis, unspecified: Secondary | ICD-10-CM | POA: Diagnosis not present

## 2019-01-21 DIAGNOSIS — J690 Pneumonitis due to inhalation of food and vomit: Secondary | ICD-10-CM | POA: Diagnosis not present

## 2019-01-21 DIAGNOSIS — Z95828 Presence of other vascular implants and grafts: Secondary | ICD-10-CM | POA: Diagnosis not present

## 2019-01-21 DIAGNOSIS — I82402 Acute embolism and thrombosis of unspecified deep veins of left lower extremity: Secondary | ICD-10-CM | POA: Diagnosis not present

## 2019-01-22 ENCOUNTER — Other Ambulatory Visit: Payer: Self-pay | Admitting: Family Medicine

## 2019-01-27 DIAGNOSIS — I69154 Hemiplegia and hemiparesis following nontraumatic intracerebral hemorrhage affecting left non-dominant side: Secondary | ICD-10-CM | POA: Diagnosis not present

## 2019-01-27 DIAGNOSIS — I69191 Dysphagia following nontraumatic intracerebral hemorrhage: Secondary | ICD-10-CM | POA: Diagnosis not present

## 2019-01-27 DIAGNOSIS — I5022 Chronic systolic (congestive) heart failure: Secondary | ICD-10-CM | POA: Diagnosis not present

## 2019-01-27 DIAGNOSIS — I4891 Unspecified atrial fibrillation: Secondary | ICD-10-CM | POA: Diagnosis not present

## 2019-01-27 DIAGNOSIS — I129 Hypertensive chronic kidney disease with stage 1 through stage 4 chronic kidney disease, or unspecified chronic kidney disease: Secondary | ICD-10-CM | POA: Diagnosis not present

## 2019-01-27 DIAGNOSIS — Z4803 Encounter for change or removal of drains: Secondary | ICD-10-CM | POA: Diagnosis not present

## 2019-01-27 DIAGNOSIS — J690 Pneumonitis due to inhalation of food and vomit: Secondary | ICD-10-CM | POA: Diagnosis not present

## 2019-01-27 DIAGNOSIS — R1310 Dysphagia, unspecified: Secondary | ICD-10-CM | POA: Diagnosis not present

## 2019-01-27 DIAGNOSIS — I82402 Acute embolism and thrombosis of unspecified deep veins of left lower extremity: Secondary | ICD-10-CM | POA: Diagnosis not present

## 2019-01-27 DIAGNOSIS — I82401 Acute embolism and thrombosis of unspecified deep veins of right lower extremity: Secondary | ICD-10-CM | POA: Diagnosis not present

## 2019-01-27 DIAGNOSIS — K819 Cholecystitis, unspecified: Secondary | ICD-10-CM | POA: Diagnosis not present

## 2019-01-27 DIAGNOSIS — I130 Hypertensive heart and chronic kidney disease with heart failure and stage 1 through stage 4 chronic kidney disease, or unspecified chronic kidney disease: Secondary | ICD-10-CM | POA: Diagnosis not present

## 2019-01-29 ENCOUNTER — Telehealth (HOSPITAL_COMMUNITY): Payer: Self-pay | Admitting: *Deleted

## 2019-01-29 DIAGNOSIS — I130 Hypertensive heart and chronic kidney disease with heart failure and stage 1 through stage 4 chronic kidney disease, or unspecified chronic kidney disease: Secondary | ICD-10-CM | POA: Diagnosis not present

## 2019-01-29 DIAGNOSIS — I82401 Acute embolism and thrombosis of unspecified deep veins of right lower extremity: Secondary | ICD-10-CM | POA: Diagnosis not present

## 2019-01-29 DIAGNOSIS — R1310 Dysphagia, unspecified: Secondary | ICD-10-CM | POA: Diagnosis not present

## 2019-01-29 DIAGNOSIS — I69154 Hemiplegia and hemiparesis following nontraumatic intracerebral hemorrhage affecting left non-dominant side: Secondary | ICD-10-CM | POA: Diagnosis not present

## 2019-01-29 DIAGNOSIS — I4891 Unspecified atrial fibrillation: Secondary | ICD-10-CM | POA: Diagnosis not present

## 2019-01-29 DIAGNOSIS — J690 Pneumonitis due to inhalation of food and vomit: Secondary | ICD-10-CM | POA: Diagnosis not present

## 2019-01-29 DIAGNOSIS — I82402 Acute embolism and thrombosis of unspecified deep veins of left lower extremity: Secondary | ICD-10-CM | POA: Diagnosis not present

## 2019-01-29 DIAGNOSIS — I69191 Dysphagia following nontraumatic intracerebral hemorrhage: Secondary | ICD-10-CM | POA: Diagnosis not present

## 2019-01-29 DIAGNOSIS — I5022 Chronic systolic (congestive) heart failure: Secondary | ICD-10-CM | POA: Diagnosis not present

## 2019-01-29 NOTE — Telephone Encounter (Signed)
-----   Message from Patsey Berthold, NP sent at 01/29/2019  1:43 PM EST ----- [place] Hun,  He's running between 110-140 ----- Message ----- From: Juluis Mire, RN Sent: 01/29/2019   1:36 PM EST To: Patsey Berthold, NP  What have his heart rates been averaging on the most recent transmission? Hes in [facility] so we may have to just call nursing with order for medication change.  ----- Message ----- From: Patsey Berthold, NP Sent: 01/29/2019  12:55 PM EST To: Juluis Mire, RN  Hey, he sent an unscheduled transmission and looks like he's back in [facility] going fairly fast. Is he someone you can see in the next few days?  Thank you!

## 2019-01-29 NOTE — Telephone Encounter (Signed)
[facility] - patient was discharged last week - attempted to call patient number on file not unable to connect call at this time. Will attempt to call again later.

## 2019-01-30 ENCOUNTER — Other Ambulatory Visit: Payer: Self-pay | Admitting: Family Medicine

## 2019-01-30 ENCOUNTER — Encounter (HOSPITAL_COMMUNITY): Payer: Self-pay

## 2019-01-30 ENCOUNTER — Ambulatory Visit: Payer: Medicare HMO | Admitting: Family Medicine

## 2019-01-30 DIAGNOSIS — R1310 Dysphagia, unspecified: Secondary | ICD-10-CM | POA: Diagnosis not present

## 2019-01-30 DIAGNOSIS — I69191 Dysphagia following nontraumatic intracerebral hemorrhage: Secondary | ICD-10-CM | POA: Diagnosis not present

## 2019-01-30 DIAGNOSIS — I69154 Hemiplegia and hemiparesis following nontraumatic intracerebral hemorrhage affecting left non-dominant side: Secondary | ICD-10-CM | POA: Diagnosis not present

## 2019-01-30 DIAGNOSIS — I4891 Unspecified atrial fibrillation: Secondary | ICD-10-CM | POA: Diagnosis not present

## 2019-01-30 DIAGNOSIS — I130 Hypertensive heart and chronic kidney disease with heart failure and stage 1 through stage 4 chronic kidney disease, or unspecified chronic kidney disease: Secondary | ICD-10-CM | POA: Diagnosis not present

## 2019-01-30 DIAGNOSIS — I5022 Chronic systolic (congestive) heart failure: Secondary | ICD-10-CM | POA: Diagnosis not present

## 2019-01-30 DIAGNOSIS — J690 Pneumonitis due to inhalation of food and vomit: Secondary | ICD-10-CM | POA: Diagnosis not present

## 2019-01-30 DIAGNOSIS — I82401 Acute embolism and thrombosis of unspecified deep veins of right lower extremity: Secondary | ICD-10-CM | POA: Diagnosis not present

## 2019-01-30 DIAGNOSIS — I82402 Acute embolism and thrombosis of unspecified deep veins of left lower extremity: Secondary | ICD-10-CM | POA: Diagnosis not present

## 2019-01-30 NOTE — Telephone Encounter (Signed)
Attempted to contact patient - unable to connect call on number provided. No DPR on file for son listed. Will attempt to reach out on mychart portal to patient.

## 2019-02-02 DIAGNOSIS — I69154 Hemiplegia and hemiparesis following nontraumatic intracerebral hemorrhage affecting left non-dominant side: Secondary | ICD-10-CM | POA: Diagnosis not present

## 2019-02-02 DIAGNOSIS — I82402 Acute embolism and thrombosis of unspecified deep veins of left lower extremity: Secondary | ICD-10-CM | POA: Diagnosis not present

## 2019-02-02 DIAGNOSIS — J690 Pneumonitis due to inhalation of food and vomit: Secondary | ICD-10-CM | POA: Diagnosis not present

## 2019-02-02 DIAGNOSIS — I130 Hypertensive heart and chronic kidney disease with heart failure and stage 1 through stage 4 chronic kidney disease, or unspecified chronic kidney disease: Secondary | ICD-10-CM | POA: Diagnosis not present

## 2019-02-02 DIAGNOSIS — R1310 Dysphagia, unspecified: Secondary | ICD-10-CM | POA: Diagnosis not present

## 2019-02-02 DIAGNOSIS — I69191 Dysphagia following nontraumatic intracerebral hemorrhage: Secondary | ICD-10-CM | POA: Diagnosis not present

## 2019-02-02 DIAGNOSIS — I5022 Chronic systolic (congestive) heart failure: Secondary | ICD-10-CM | POA: Diagnosis not present

## 2019-02-02 DIAGNOSIS — I82401 Acute embolism and thrombosis of unspecified deep veins of right lower extremity: Secondary | ICD-10-CM | POA: Diagnosis not present

## 2019-02-02 DIAGNOSIS — I4891 Unspecified atrial fibrillation: Secondary | ICD-10-CM | POA: Diagnosis not present

## 2019-02-03 ENCOUNTER — Encounter (HOSPITAL_COMMUNITY): Payer: Self-pay

## 2019-02-03 ENCOUNTER — Other Ambulatory Visit: Payer: Self-pay

## 2019-02-03 ENCOUNTER — Emergency Department (HOSPITAL_COMMUNITY): Payer: Medicare HMO

## 2019-02-03 ENCOUNTER — Telehealth: Payer: Self-pay

## 2019-02-03 ENCOUNTER — Inpatient Hospital Stay (HOSPITAL_COMMUNITY)
Admission: EM | Admit: 2019-02-03 | Discharge: 2019-02-07 | DRG: 308 | Disposition: A | Discharge status: Home Health | Payer: Medicare HMO | Attending: Internal Medicine | Admitting: Internal Medicine

## 2019-02-03 DIAGNOSIS — I4892 Unspecified atrial flutter: Secondary | ICD-10-CM | POA: Diagnosis present

## 2019-02-03 DIAGNOSIS — K8000 Calculus of gallbladder with acute cholecystitis without obstruction: Secondary | ICD-10-CM | POA: Diagnosis present

## 2019-02-03 DIAGNOSIS — Z823 Family history of stroke: Secondary | ICD-10-CM | POA: Diagnosis not present

## 2019-02-03 DIAGNOSIS — J690 Pneumonitis due to inhalation of food and vomit: Secondary | ICD-10-CM | POA: Diagnosis not present

## 2019-02-03 DIAGNOSIS — I5021 Acute systolic (congestive) heart failure: Secondary | ICD-10-CM | POA: Diagnosis not present

## 2019-02-03 DIAGNOSIS — D649 Anemia, unspecified: Secondary | ICD-10-CM | POA: Diagnosis present

## 2019-02-03 DIAGNOSIS — I69191 Dysphagia following nontraumatic intracerebral hemorrhage: Secondary | ICD-10-CM | POA: Diagnosis not present

## 2019-02-03 DIAGNOSIS — B1920 Unspecified viral hepatitis C without hepatic coma: Secondary | ICD-10-CM | POA: Diagnosis present

## 2019-02-03 DIAGNOSIS — I513 Intracardiac thrombosis, not elsewhere classified: Secondary | ICD-10-CM | POA: Diagnosis present

## 2019-02-03 DIAGNOSIS — I824Y2 Acute embolism and thrombosis of unspecified deep veins of left proximal lower extremity: Secondary | ICD-10-CM | POA: Diagnosis not present

## 2019-02-03 DIAGNOSIS — I361 Nonrheumatic tricuspid (valve) insufficiency: Secondary | ICD-10-CM | POA: Diagnosis not present

## 2019-02-03 DIAGNOSIS — K819 Cholecystitis, unspecified: Secondary | ICD-10-CM | POA: Diagnosis present

## 2019-02-03 DIAGNOSIS — I4891 Unspecified atrial fibrillation: Secondary | ICD-10-CM | POA: Diagnosis not present

## 2019-02-03 DIAGNOSIS — F17210 Nicotine dependence, cigarettes, uncomplicated: Secondary | ICD-10-CM | POA: Diagnosis present

## 2019-02-03 DIAGNOSIS — I82401 Acute embolism and thrombosis of unspecified deep veins of right lower extremity: Secondary | ICD-10-CM | POA: Diagnosis not present

## 2019-02-03 DIAGNOSIS — Z888 Allergy status to other drugs, medicaments and biological substances status: Secondary | ICD-10-CM | POA: Diagnosis not present

## 2019-02-03 DIAGNOSIS — Z8619 Personal history of other infectious and parasitic diseases: Secondary | ICD-10-CM

## 2019-02-03 DIAGNOSIS — Z23 Encounter for immunization: Secondary | ICD-10-CM | POA: Diagnosis present

## 2019-02-03 DIAGNOSIS — I4821 Permanent atrial fibrillation: Principal | ICD-10-CM | POA: Diagnosis present

## 2019-02-03 DIAGNOSIS — Z20828 Contact with and (suspected) exposure to other viral communicable diseases: Secondary | ICD-10-CM | POA: Diagnosis present

## 2019-02-03 DIAGNOSIS — I5023 Acute on chronic systolic (congestive) heart failure: Secondary | ICD-10-CM | POA: Diagnosis present

## 2019-02-03 DIAGNOSIS — I420 Dilated cardiomyopathy: Secondary | ICD-10-CM | POA: Diagnosis present

## 2019-02-03 DIAGNOSIS — I82402 Acute embolism and thrombosis of unspecified deep veins of left lower extremity: Secondary | ICD-10-CM | POA: Diagnosis present

## 2019-02-03 DIAGNOSIS — I083 Combined rheumatic disorders of mitral, aortic and tricuspid valves: Secondary | ICD-10-CM | POA: Diagnosis not present

## 2019-02-03 DIAGNOSIS — K8020 Calculus of gallbladder without cholecystitis without obstruction: Secondary | ICD-10-CM | POA: Diagnosis not present

## 2019-02-03 DIAGNOSIS — M7989 Other specified soft tissue disorders: Secondary | ICD-10-CM | POA: Diagnosis present

## 2019-02-03 DIAGNOSIS — Z8249 Family history of ischemic heart disease and other diseases of the circulatory system: Secondary | ICD-10-CM | POA: Diagnosis not present

## 2019-02-03 DIAGNOSIS — Z8701 Personal history of pneumonia (recurrent): Secondary | ICD-10-CM

## 2019-02-03 DIAGNOSIS — I340 Nonrheumatic mitral (valve) insufficiency: Secondary | ICD-10-CM | POA: Diagnosis not present

## 2019-02-03 DIAGNOSIS — J9811 Atelectasis: Secondary | ICD-10-CM | POA: Diagnosis present

## 2019-02-03 DIAGNOSIS — I10 Essential (primary) hypertension: Secondary | ICD-10-CM | POA: Diagnosis not present

## 2019-02-03 DIAGNOSIS — I5043 Acute on chronic combined systolic (congestive) and diastolic (congestive) heart failure: Secondary | ICD-10-CM | POA: Diagnosis not present

## 2019-02-03 DIAGNOSIS — I69352 Hemiplegia and hemiparesis following cerebral infarction affecting left dominant side: Secondary | ICD-10-CM

## 2019-02-03 DIAGNOSIS — K811 Chronic cholecystitis: Secondary | ICD-10-CM | POA: Diagnosis present

## 2019-02-03 DIAGNOSIS — R0602 Shortness of breath: Secondary | ICD-10-CM | POA: Diagnosis not present

## 2019-02-03 DIAGNOSIS — I509 Heart failure, unspecified: Secondary | ICD-10-CM

## 2019-02-03 DIAGNOSIS — Z7902 Long term (current) use of antithrombotics/antiplatelets: Secondary | ICD-10-CM

## 2019-02-03 DIAGNOSIS — I82622 Acute embolism and thrombosis of deep veins of left upper extremity: Secondary | ICD-10-CM | POA: Diagnosis present

## 2019-02-03 DIAGNOSIS — I480 Paroxysmal atrial fibrillation: Secondary | ICD-10-CM | POA: Diagnosis not present

## 2019-02-03 DIAGNOSIS — N179 Acute kidney failure, unspecified: Secondary | ICD-10-CM | POA: Diagnosis present

## 2019-02-03 DIAGNOSIS — I472 Ventricular tachycardia: Secondary | ICD-10-CM | POA: Diagnosis present

## 2019-02-03 DIAGNOSIS — I130 Hypertensive heart and chronic kidney disease with heart failure and stage 1 through stage 4 chronic kidney disease, or unspecified chronic kidney disease: Secondary | ICD-10-CM | POA: Diagnosis present

## 2019-02-03 DIAGNOSIS — N183 Chronic kidney disease, stage 3 unspecified: Secondary | ICD-10-CM | POA: Diagnosis not present

## 2019-02-03 DIAGNOSIS — I110 Hypertensive heart disease with heart failure: Secondary | ICD-10-CM | POA: Diagnosis not present

## 2019-02-03 DIAGNOSIS — I4819 Other persistent atrial fibrillation: Secondary | ICD-10-CM | POA: Diagnosis not present

## 2019-02-03 DIAGNOSIS — I482 Chronic atrial fibrillation, unspecified: Secondary | ICD-10-CM | POA: Diagnosis present

## 2019-02-03 DIAGNOSIS — Z95810 Presence of automatic (implantable) cardiac defibrillator: Secondary | ICD-10-CM | POA: Diagnosis not present

## 2019-02-03 DIAGNOSIS — R1011 Right upper quadrant pain: Secondary | ICD-10-CM

## 2019-02-03 DIAGNOSIS — Z79899 Other long term (current) drug therapy: Secondary | ICD-10-CM

## 2019-02-03 DIAGNOSIS — R1310 Dysphagia, unspecified: Secondary | ICD-10-CM | POA: Diagnosis not present

## 2019-02-03 DIAGNOSIS — I69359 Hemiplegia and hemiparesis following cerebral infarction affecting unspecified side: Secondary | ICD-10-CM

## 2019-02-03 DIAGNOSIS — R531 Weakness: Secondary | ICD-10-CM | POA: Diagnosis not present

## 2019-02-03 DIAGNOSIS — I5022 Chronic systolic (congestive) heart failure: Secondary | ICD-10-CM | POA: Diagnosis not present

## 2019-02-03 DIAGNOSIS — E785 Hyperlipidemia, unspecified: Secondary | ICD-10-CM | POA: Diagnosis present

## 2019-02-03 DIAGNOSIS — I69154 Hemiplegia and hemiparesis following nontraumatic intracerebral hemorrhage affecting left non-dominant side: Secondary | ICD-10-CM | POA: Diagnosis not present

## 2019-02-03 LAB — CBC
HCT: 33 % — ABNORMAL LOW (ref 39.0–52.0)
Hemoglobin: 10.9 g/dL — ABNORMAL LOW (ref 13.0–17.0)
MCH: 30.4 pg (ref 26.0–34.0)
MCHC: 33 g/dL (ref 30.0–36.0)
MCV: 92.2 fL (ref 80.0–100.0)
Platelets: 173 10*3/uL (ref 150–400)
RBC: 3.58 MIL/uL — ABNORMAL LOW (ref 4.22–5.81)
RDW: 14.9 % (ref 11.5–15.5)
WBC: 4.7 10*3/uL (ref 4.0–10.5)
nRBC: 0 % (ref 0.0–0.2)

## 2019-02-03 LAB — BRAIN NATRIURETIC PEPTIDE: B Natriuretic Peptide: 922.2 pg/mL — ABNORMAL HIGH (ref 0.0–100.0)

## 2019-02-03 LAB — BASIC METABOLIC PANEL
Anion gap: 12 (ref 5–15)
BUN: 10 mg/dL (ref 8–23)
CO2: 20 mmol/L — ABNORMAL LOW (ref 22–32)
Calcium: 9.5 mg/dL (ref 8.9–10.3)
Chloride: 108 mmol/L (ref 98–111)
Creatinine, Ser: 1.09 mg/dL (ref 0.61–1.24)
GFR calc Af Amer: >60 mL/min (ref >60)
GFR calc non Af Amer: >60 mL/min (ref >60)
Glucose, Bld: 113 mg/dL — ABNORMAL HIGH (ref 70–99)
Potassium: 3.7 mmol/L (ref 3.5–5.1)
Sodium: 140 mmol/L (ref 135–145)

## 2019-02-03 LAB — MAGNESIUM: Magnesium: 1.6 mg/dL — ABNORMAL LOW (ref 1.7–2.4)

## 2019-02-03 MED ORDER — HYDROMORPHONE HCL 1 MG/ML IJ SOLN
1.0000 mg | Freq: Once | INTRAMUSCULAR | Status: AC
  Administered 2019-02-03: 1 mg via INTRAMUSCULAR
  Filled 2019-02-03: qty 1

## 2019-02-03 MED ORDER — DILTIAZEM HCL-DEXTROSE 125-5 MG/125ML-% IV SOLN (PREMIX)
5.0000 mg/h | INTRAVENOUS | Status: DC
  Administered 2019-02-03: 5 mg/h via INTRAVENOUS
  Filled 2019-02-03 (×2): qty 125

## 2019-02-03 MED ORDER — DILTIAZEM LOAD VIA INFUSION
15.0000 mg | Freq: Once | INTRAVENOUS | Status: AC
  Administered 2019-02-03: 15 mg via INTRAVENOUS
  Filled 2019-02-03: qty 15

## 2019-02-03 MED ORDER — HYDROMORPHONE HCL 1 MG/ML IJ SOLN
0.5000 mg | Freq: Once | INTRAMUSCULAR | Status: DC
  Filled 2019-02-03: qty 1

## 2019-02-03 MED ORDER — ONDANSETRON 4 MG PO TBDP
4.0000 mg | ORAL_TABLET | Freq: Once | ORAL | Status: AC
  Administered 2019-02-03: 4 mg via ORAL
  Filled 2019-02-03: qty 1

## 2019-02-03 MED ORDER — FUROSEMIDE 10 MG/ML IJ SOLN
40.0000 mg | Freq: Once | INTRAMUSCULAR | Status: AC
  Administered 2019-02-04: 40 mg via INTRAVENOUS
  Filled 2019-02-03: qty 4

## 2019-02-03 MED ORDER — ONDANSETRON HCL 4 MG/2ML IJ SOLN
4.0000 mg | Freq: Once | INTRAMUSCULAR | Status: DC
  Filled 2019-02-03: qty 2

## 2019-02-03 NOTE — Telephone Encounter (Signed)
Physical therapist Tilford Pillar, [phone]) called nurse line, asking for doctor to call her back regarding patient's increased shortness of breath, orthopnea, diminished R lower lung sounds, LLE edema, and filling full in abdomen.   Called physical therapist and LVM to return call to get more information. Patient also called and no voicemail has been set up. Talbot Grumbling, RN

## 2019-02-03 NOTE — ED Provider Notes (Signed)
[facility] EMERGENCY DEPARTMENT Provider Note   CSN: [id number] Arrival date & time: 02/03/19  1817     History Chief Complaint  Patient presents with  . Shortness of Breath    Glenn [name] is a 68 y.o. male.  Patient with history of atrial fibrillation on [name] currently and metoprolol for rate control, recent IVC filter placement, ischemic cardiomyopathy with left ventricular ejection fraction of 15 to 20% s/p ICD on furosemide 40mg  daily, stage III CKD hypertension, hyperlipidemia, recent hospitalization for right MCA subacute hemorrhagic CVA, recent admission for atrial fibrillation with RVR and sepsis secondary to aspiration pneumonia and acute cholecystitis, possible acute on chronic diastolic heart failure and extensive DVT -- presents with c/o shortness of breath ongoing over the past 2 days.  Physical therapy has seen patient today and there is note in the chart regarding increasing shortness of breath and lower extremity swelling.  Patient denies any fevers, cough, chest pain, abdominal pain.  No nausea, vomiting, or diarrhea.  States that he gets very short winded with activities.  He does tell me that he was taken off all of his anticoagulation and it is unclear if he is currently taking [name].        Past Medical History:  Diagnosis Date  . Atrial fibrillation ([place])   . CHF (congestive heart failure) ([place])   . Hepatitis C   . Hypertension   . Stroke ([place])   . Visit for monitoring Tikosyn therapy 03/26/2017    Patient Active Problem List   Diagnosis Date Noted  . Acute deep vein thrombosis (DVT) of both lower extremities ([name])   . Rapid atrial fibrillation ([place]) 12/02/2018  . Acute blood loss anemia   . Labile blood glucose   . AKI (acute kidney injury) ([place])   . Chronic right hip pain   . Acute renal failure superimposed on stage 3a chronic kidney disease ([place])   . Cough with fever   . Headache due to intracranial disease  11/14/2018  . Trochanteric bursitis, right hip 11/14/2018  . Cerebral edema ([place]) 11/13/2018  . Hyperlipidemia LDL goal <70 11/13/2018  . Marijuana user 11/13/2018  . Right middle cerebral artery stroke ([place]) 11/13/2018  . Acute CVA (cerebrovascular accident) ([place])   . Noncompliance   . Dysphagia, post-stroke   . Acute on chronic combined systolic and diastolic CHF (congestive heart failure) ([place])   . Wide-complex tachycardia ([place])   . DCM (dilated cardiomyopathy) ([place])   . Atrial fibrillation ([place]) 11/04/2018  . Acute on chronic systolic heart failure ([place])   . Prolonged Q-T interval on ECG   . Elevated troponin   . CHF exacerbation ([place]) 10/30/2018  . Acute respiratory failure with hypoxia ([place])   . Acute kidney injury ([place])   . Entrapment of right ulnar nerve 02/28/2018  . Carpal tunnel syndrome of right wrist 02/28/2018  . Impotence due to erectile dysfunction 09/30/2017  . Solitary pulmonary nodule 06/10/2017  . Neck pain 04/06/2017  . Paroxysmal atrial fibrillation ([name])   . Visit for monitoring Tikosyn therapy 03/25/2017  . ICD (implantable cardioverter-defibrillator) in place 09/13/2016  . Chest pain 09/13/2016  . Tobacco abuse 09/13/2016  . Hospital discharge follow-up 09/13/2016  . Upper back pain 09/13/2016  . Housing problems 09/13/2016  . Persistent atrial fibrillation ([place])   . Atrial fibrillation with RVR ([place])   . Ischemic cardiomyopathy   . Cerebral infarction ([place])   . Stroke (cerebrum) ([name]) Lg L MCA infarct w/ hemorrhagic conversion, embolic  d/t AF 09/05/2016  . CHF (congestive heart failure) ([place]) 08/14/2016  . HTN (hypertension) 08/14/2016  . Chronic Hepatitis C  08/14/2016    Past Surgical History:  Procedure Laterality Date  . CARDIAC DEFIBRILLATOR PLACEMENT  2015  . CARDIOVERSION N/A 10/10/2016   Procedure: CARDIOVERSION;  Surgeon: Dorothy Spark, MD;  Location: [facility];  Service: Cardiovascular;  Laterality: N/A;  . CARDIOVERSION N/A  03/27/2017   Procedure: CARDIOVERSION;  Surgeon: Jerline Pain, MD;  Location: [facility] ENDOSCOPY;  Service: Cardiovascular;  Laterality: N/A;  . CARDIOVERSION N/A 10/29/2018   Procedure: CARDIOVERSION;  Surgeon: Sanda Klein, MD;  Location: [facility] ENDOSCOPY;  Service: Cardiovascular;  Laterality: N/A;  . CARDIOVERSION N/A 11/05/2018   Procedure: CARDIOVERSION;  Surgeon: Acie Fredrickson Wonda Cheng, MD;  Location: [facility];  Service: Cardiovascular;  Laterality: N/A;  . EYE SURGERY Left 1990  . IR IVC FILTER PLMT / S&I /IMG GUID/MOD SED  12/04/2018  . IR PERC CHOLECYSTOSTOMY  12/13/2018  . IR PERCUTANEOUS ART THROMBECTOMY/INFUSION INTRACRANIAL INC DIAG ANGIO  09/05/2016  . IR RADIOLOGIST EVAL & MGMT  10/03/2016  . RADIOLOGY WITH ANESTHESIA N/A 09/05/2016   Procedure: RADIOLOGY WITH ANESTHESIA;  Surgeon: Luanne Bras, MD;  Location: [facility];  Service: Radiology;  Laterality: N/A;  . RIGHT/LEFT HEART CATH AND CORONARY ANGIOGRAPHY N/A 11/03/2018   Procedure: RIGHT/LEFT HEART CATH AND CORONARY ANGIOGRAPHY;  Surgeon: Lorretta Harp, MD;  Location: [facility] CV LAB;  Service: Cardiovascular;  Laterality: N/A;       Family History  Problem Relation Age of Onset  . High blood pressure Mother   . High blood pressure Father   . Stroke Maternal Aunt   . Heart disease Neg Hx     Social History   Tobacco Use  . Smoking status: Current Some Day Smoker    Packs/day: 0.50    Types: Cigarettes  . Smokeless tobacco: Never Used  . Tobacco comment: a pack last three days  Substance Use Topics  . Alcohol use: Not Currently    Alcohol/week: 3.0 standard drinks    Types: 3 Cans of beer per week    Comment: pt stop drinking   . Drug use: Yes    Frequency: 2.0 times per week    Types: Marijuana    Comment: stop smoking     Home Medications Prior to Admission medications   Medication Sig Start Date End Date Taking? Authorizing Provider  acetaminophen (TYLENOL) 325 MG tablet Take 2 tablets (650 mg total) by  mouth every 4 (four) hours as needed for mild pain (or temp > 37.5 C (99.5 F)). 12/02/18   Angiulli, Lavon Paganini, PA-C  atorvastatin (LIPITOR) 20 MG tablet Take 1 tablet (20 mg total) by mouth daily at 6 PM. 11/13/18   Donzetta Starch, NP  calcium carbonate (TUMS - DOSED IN MG ELEMENTAL CALCIUM) 500 MG chewable tablet Chew 1 tablet (200 mg of elemental calcium total) by mouth daily as needed for indigestion or heartburn. 12/24/18   Shahmehdi, Valeria Batman, MD  dabigatran ([name]) 150 MG CAPS capsule Take 1 capsule (150 mg total) by mouth every 12 (twelve) hours. 12/24/18   Shahmehdi, Valeria Batman, MD  feeding supplement, ENSURE ENLIVE, (ENSURE ENLIVE) LIQD Take 237 mLs by mouth 3 (three) times daily between meals. 12/24/18   Shahmehdi, Valeria Batman, MD  furosemide (LASIX) 40 MG tablet Take 1 tablet (40 mg total) by mouth daily. 11/06/18   Mullis, Kiersten P, DO  gabapentin (NEURONTIN) 300 MG capsule Take 1 capsule (  300 mg total) by mouth every 8 (eight) hours. 12/24/18 01/23/19  Shahmehdi, Valeria Batman, MD  ipratropium (ATROVENT) 0.02 % nebulizer solution Take 2.5 mLs (0.5 mg total) by nebulization every 6 (six) hours as needed for wheezing or shortness of breath. 12/24/18   Shahmehdi, Valeria Batman, MD  levalbuterol (XOPENEX) 0.63 MG/3ML nebulizer solution Take 3 mLs (0.63 mg total) by nebulization every 6 (six) hours as needed for wheezing or shortness of breath. 12/24/18   Shahmehdi, Valeria Batman, MD  lidocaine (LIDODERM) 5 % Place 1 patch onto the skin daily. Remove & Discard patch within 12 hours or as directed by MD 12/24/18   Deatra James, MD  metoprolol succinate (TOPROL-XL) 100 MG 24 hr tablet Take 1 tablet (100 mg total) by mouth daily. Take with or immediately following a meal. 12/24/18   Shahmehdi, Seyed A, MD  pantoprazole (PROTONIX) 40 MG tablet Take 1 tablet (40 mg total) by mouth daily. 12/24/18 01/23/19  Shahmehdi, Valeria Batman, MD  potassium chloride SA (K-DUR) 20 MEQ tablet Take 1 tablet (20 mEq total) by mouth daily. 11/06/18    Mullis, Kiersten P, DO  spironolactone (ALDACTONE) 25 MG tablet Take 1 tablet (25 mg total) by mouth daily. 12/24/18 01/23/19  Shahmehdi, Valeria Batman, MD  traMADol (ULTRAM) 50 MG tablet Take 1 tablet (50 mg total) by mouth every 8 (eight) hours as needed for severe pain. 12/02/18   Angiulli, Lavon Paganini, PA-C    Allergies    Benadryl [diphenhydramine]  Review of Systems   Review of Systems  Constitutional: Negative for fever.  HENT: Negative for rhinorrhea and sore throat.   Eyes: Negative for redness.  Respiratory: Positive for shortness of breath. Negative for cough.   Cardiovascular: Positive for leg swelling. Negative for chest pain.  Gastrointestinal: Negative for abdominal pain, diarrhea, nausea and vomiting.  Genitourinary: Negative for dysuria.  Musculoskeletal: Negative for myalgias.  Skin: Negative for rash.  Neurological: Negative for headaches.    Physical Exam Updated Vital Signs BP (!) 130/109   Pulse 74   Temp 98.3 F (36.8 C) (Oral)   Resp 16   SpO2 98%   Physical Exam Vitals and nursing note reviewed.  Constitutional:      Appearance: He is well-developed. He is not diaphoretic.  HENT:     Head: Normocephalic and atraumatic.     Mouth/Throat:     Mouth: Mucous membranes are not dry.  Eyes:     Conjunctiva/sclera: Conjunctivae normal.  Neck:     Vascular: Normal carotid pulses. JVD present. No carotid bruit.     Trachea: Trachea normal. No tracheal deviation.  Cardiovascular:     Rate and Rhythm: Tachycardia present. Rhythm irregular.     Pulses: No decreased pulses.     Heart sounds: Normal heart sounds, S1 normal and S2 normal. Heart sounds not distant. No murmur.  Pulmonary:     Effort: Pulmonary effort is normal. No respiratory distress.     Breath sounds: Examination of the right-lower field reveals decreased breath sounds. Decreased breath sounds present. No wheezing.  Chest:     Chest wall: No tenderness.  Abdominal:     General: Bowel sounds are  normal.     Palpations: Abdomen is soft.     Tenderness: There is no abdominal tenderness. There is no guarding or rebound.     Comments: Drain in place R abd  Musculoskeletal:     Cervical back: Normal range of motion and neck supple. No muscular tenderness.  Right lower leg: Edema present.     Left lower leg: Edema present.     Comments: 3+ pitting edema bilateral lower extremities  Skin:    General: Skin is warm and dry.     Coloration: Skin is not pale.  Neurological:     Mental Status: He is alert.     ED Results / Procedures / Treatments   Labs (all labs ordered are listed, but only abnormal results are displayed) Labs Reviewed  BASIC METABOLIC PANEL - Abnormal; Notable for the following components:      Result Value   CO2 20 (*)    Glucose, Bld 113 (*)    All other components within normal limits  CBC - Abnormal; Notable for the following components:   RBC 3.58 (*)    Hemoglobin 10.9 (*)    HCT 33.0 (*)    All other components within normal limits  MAGNESIUM - Abnormal; Notable for the following components:   Magnesium 1.6 (*)    All other components within normal limits  BRAIN NATRIURETIC PEPTIDE - Abnormal; Notable for the following components:   B Natriuretic Peptide 922.2 (*)    All other components within normal limits  SARS CORONAVIRUS 2 (TAT 6-24 HRS)    EKG EKG Interpretation  Date/Time:  Tuesday February 03 2019 19:41:09 EST Ventricular Rate:  160 PR Interval:    QRS Duration: 95 QT Interval:  286 QTC Calculation: 467 R Axis:   39 Text Interpretation: Atrial fibrillation with rapid V-rate Repolarization abnormality, prob rate related No significant change since last tracing Confirmed by Gareth Morgan [phone]) on 02/03/2019 10:43:36 PM   Radiology DG Chest 2 View  Result Date: 02/03/2019 CLINICAL DATA:  Shortness of breath EXAM: CHEST - 2 VIEW COMPARISON:  12/09/2018 FINDINGS: Left AICD. Cardiomegaly with vascular congestion. Airspace  disease noted in the lower lobes bilaterally, right greater than left. This is slightly increased at the left base, similar on the right since prior study. Suspect small right pleural effusion. No acute bony abnormality. IMPRESSION: Persistent right basilar infiltrate. Increasing left basilar atelectasis or infiltrate. Cardiomegaly. Electronically Signed   By: Rolm Baptise M.D.   On: 02/03/2019 19:26    Procedures Procedures (including critical care time)  Medications Ordered in ED Medications  diltiazem (CARDIZEM) 1 mg/mL load via infusion 15 mg (15 mg Intravenous Bolus from Bag 02/03/19 2226)    And  diltiazem (CARDIZEM) 125 mg in dextrose 5% 125 mL (1 mg/mL) infusion (5 mg/hr Intravenous New Bag/Given 02/03/19 2227)  furosemide (LASIX) injection 40 mg (has no administration in time range)  HYDROmorphone (DILAUDID) injection 1 mg (1 mg Intramuscular Given 02/03/19 2201)  ondansetron (ZOFRAN-ODT) disintegrating tablet 4 mg (4 mg Oral Given 02/03/19 2202)    ED Course  I have reviewed the triage vital signs and the nursing notes.  Pertinent labs & imaging results that were available during my care of the patient were reviewed by me and considered in my medical decision making (see chart for details).  Patient seen and examined. Work-up initiated. Medications ordered. [place] IV per me after failed attempts by RN. Good draw back and flush. Additional blood obtained. RN to monitor during administration of medications.   Vital signs reviewed and are as follows: BP (!) 131/98 (BP Location: Right Arm)   Pulse (!) 167   Temp 98.3 F (36.8 C) (Oral)   Resp (!) 24   SpO2 99%   EMERGENCY DEPARTMENT  US GUIDANCE EXAM Emergency Ultrasound:  US Guidance for  Needle Guidance  INDICATIONS: Difficult vascular access Linear probe used in real-time to visualize location of needle entry through skin.   PERFORMED BY: Myself IMAGES ARCHIVED?: Yes LIMITATIONS: None VIEWS USED:  Transverse INTERPRETATION: Right arm      9:50 PM Patient moved and pulled out IV.   11:22 PM IV established in neck. Now rate controlled low 100's. Will admit.   I discussed case with cardiology fellow Dr. Emilio Aspen.   12:24 AM I discussed case with Dr. Myna Hidalgo of [facility] who will see.  CRITICAL CARE Performed by: Carlisle Cater PA-C Total critical care time: 40 minutes Critical care time was exclusive of separately billable procedures and treating other patients. Critical care was necessary to treat or prevent imminent or life-threatening deterioration. Critical care was time spent personally by me on the following activities: development of treatment plan with patient and/or surrogate as well as nursing, discussions with consultants, evaluation of patient's response to treatment, examination of patient, obtaining history from patient or surrogate, ordering and performing treatments and interventions, ordering and review of laboratory studies, ordering and review of radiographic studies, pulse oximetry and re-evaluation of patient's condition.   MDM Rules/Calculators/A&P                      Admit.   Final Clinical Impression(s) / ED Diagnoses Final diagnoses:  Atrial fibrillation with RVR ([place])  Acute on chronic congestive heart failure, unspecified heart failure type [facility])    Rx / DC Orders ED Discharge Orders         Ordered    Amb referral to AFIB Clinic     02/03/19 2007           Carlisle Cater, Hershal Coria 02/04/19 0024    Gareth Morgan, MD 02/06/19 [phone]

## 2019-02-03 NOTE — ED Triage Notes (Signed)
Pt reports worsening SOB over the past 2 days, denies cough or fever. Hx of CHF. Pt a.o, resp e.u, denies chest pain

## 2019-02-03 NOTE — Telephone Encounter (Signed)
Was able to reach pt @ [phone].  He sounds very SOB, having to catch his breath mid sentence.  Recommended he go to the UC or ED tonight.  Pt is agreeable and states that his son will be home in one hour and he will ask son to take him.  Advised of red flags to call 911.  [name].   Christen Bame, CMA

## 2019-02-04 ENCOUNTER — Inpatient Hospital Stay (HOSPITAL_COMMUNITY): Payer: Medicare HMO

## 2019-02-04 ENCOUNTER — Encounter (HOSPITAL_COMMUNITY): Payer: Self-pay | Admitting: Family Medicine

## 2019-02-04 DIAGNOSIS — I513 Intracardiac thrombosis, not elsewhere classified: Secondary | ICD-10-CM | POA: Diagnosis present

## 2019-02-04 DIAGNOSIS — I69359 Hemiplegia and hemiparesis following cerebral infarction affecting unspecified side: Secondary | ICD-10-CM

## 2019-02-04 DIAGNOSIS — I472 Ventricular tachycardia: Secondary | ICD-10-CM | POA: Diagnosis present

## 2019-02-04 DIAGNOSIS — I5023 Acute on chronic systolic (congestive) heart failure: Secondary | ICD-10-CM

## 2019-02-04 DIAGNOSIS — I361 Nonrheumatic tricuspid (valve) insufficiency: Secondary | ICD-10-CM | POA: Diagnosis not present

## 2019-02-04 DIAGNOSIS — I4892 Unspecified atrial flutter: Secondary | ICD-10-CM | POA: Diagnosis present

## 2019-02-04 DIAGNOSIS — I340 Nonrheumatic mitral (valve) insufficiency: Secondary | ICD-10-CM | POA: Diagnosis not present

## 2019-02-04 DIAGNOSIS — Z8619 Personal history of other infectious and parasitic diseases: Secondary | ICD-10-CM | POA: Diagnosis not present

## 2019-02-04 DIAGNOSIS — I82402 Acute embolism and thrombosis of unspecified deep veins of left lower extremity: Secondary | ICD-10-CM | POA: Diagnosis present

## 2019-02-04 DIAGNOSIS — K819 Cholecystitis, unspecified: Secondary | ICD-10-CM | POA: Diagnosis present

## 2019-02-04 DIAGNOSIS — I69352 Hemiplegia and hemiparesis following cerebral infarction affecting left dominant side: Secondary | ICD-10-CM | POA: Diagnosis not present

## 2019-02-04 DIAGNOSIS — Z823 Family history of stroke: Secondary | ICD-10-CM | POA: Diagnosis not present

## 2019-02-04 DIAGNOSIS — I82622 Acute embolism and thrombosis of deep veins of left upper extremity: Secondary | ICD-10-CM | POA: Diagnosis present

## 2019-02-04 DIAGNOSIS — Z95810 Presence of automatic (implantable) cardiac defibrillator: Secondary | ICD-10-CM | POA: Diagnosis not present

## 2019-02-04 DIAGNOSIS — K8000 Calculus of gallbladder with acute cholecystitis without obstruction: Secondary | ICD-10-CM | POA: Diagnosis present

## 2019-02-04 DIAGNOSIS — I5021 Acute systolic (congestive) heart failure: Secondary | ICD-10-CM

## 2019-02-04 DIAGNOSIS — I4819 Other persistent atrial fibrillation: Secondary | ICD-10-CM | POA: Diagnosis not present

## 2019-02-04 DIAGNOSIS — Z8249 Family history of ischemic heart disease and other diseases of the circulatory system: Secondary | ICD-10-CM | POA: Diagnosis not present

## 2019-02-04 DIAGNOSIS — I4891 Unspecified atrial fibrillation: Secondary | ICD-10-CM | POA: Diagnosis not present

## 2019-02-04 DIAGNOSIS — I509 Heart failure, unspecified: Secondary | ICD-10-CM | POA: Diagnosis not present

## 2019-02-04 DIAGNOSIS — K811 Chronic cholecystitis: Secondary | ICD-10-CM | POA: Diagnosis present

## 2019-02-04 DIAGNOSIS — I130 Hypertensive heart and chronic kidney disease with heart failure and stage 1 through stage 4 chronic kidney disease, or unspecified chronic kidney disease: Secondary | ICD-10-CM | POA: Diagnosis present

## 2019-02-04 DIAGNOSIS — F17210 Nicotine dependence, cigarettes, uncomplicated: Secondary | ICD-10-CM | POA: Diagnosis present

## 2019-02-04 DIAGNOSIS — I420 Dilated cardiomyopathy: Secondary | ICD-10-CM | POA: Diagnosis present

## 2019-02-04 DIAGNOSIS — I10 Essential (primary) hypertension: Secondary | ICD-10-CM | POA: Diagnosis not present

## 2019-02-04 DIAGNOSIS — J9811 Atelectasis: Secondary | ICD-10-CM | POA: Diagnosis present

## 2019-02-04 DIAGNOSIS — Z888 Allergy status to other drugs, medicaments and biological substances status: Secondary | ICD-10-CM | POA: Diagnosis not present

## 2019-02-04 DIAGNOSIS — I4821 Permanent atrial fibrillation: Secondary | ICD-10-CM | POA: Diagnosis present

## 2019-02-04 DIAGNOSIS — N179 Acute kidney failure, unspecified: Secondary | ICD-10-CM | POA: Diagnosis present

## 2019-02-04 DIAGNOSIS — Z8701 Personal history of pneumonia (recurrent): Secondary | ICD-10-CM | POA: Diagnosis not present

## 2019-02-04 DIAGNOSIS — I824Y2 Acute embolism and thrombosis of unspecified deep veins of left proximal lower extremity: Secondary | ICD-10-CM

## 2019-02-04 DIAGNOSIS — Z20828 Contact with and (suspected) exposure to other viral communicable diseases: Secondary | ICD-10-CM | POA: Diagnosis present

## 2019-02-04 DIAGNOSIS — I482 Chronic atrial fibrillation, unspecified: Secondary | ICD-10-CM | POA: Diagnosis present

## 2019-02-04 DIAGNOSIS — B1920 Unspecified viral hepatitis C without hepatic coma: Secondary | ICD-10-CM | POA: Diagnosis present

## 2019-02-04 DIAGNOSIS — Z23 Encounter for immunization: Secondary | ICD-10-CM | POA: Diagnosis present

## 2019-02-04 HISTORY — DX: Acute embolism and thrombosis of unspecified deep veins of left lower extremity: I82.402

## 2019-02-04 HISTORY — DX: Cholecystitis, unspecified: K81.9

## 2019-02-04 HISTORY — DX: Hemiplegia and hemiparesis following cerebral infarction affecting unspecified side: I69.359

## 2019-02-04 LAB — MAGNESIUM
Magnesium: 1.5 mg/dL — ABNORMAL LOW (ref 1.7–2.4)
Magnesium: 1.8 mg/dL (ref 1.7–2.4)

## 2019-02-04 LAB — SARS CORONAVIRUS 2 (TAT 6-24 HRS): SARS Coronavirus 2: NEGATIVE

## 2019-02-04 LAB — GLUCOSE, CAPILLARY
Glucose-Capillary: 136 mg/dL — ABNORMAL HIGH (ref 70–99)
Glucose-Capillary: 142 mg/dL — ABNORMAL HIGH (ref 70–99)

## 2019-02-04 MED ORDER — AMIODARONE HCL IN DEXTROSE 360-4.14 MG/200ML-% IV SOLN
60.0000 mg/h | INTRAVENOUS | Status: DC
  Administered 2019-02-04 (×2): 60 mg/h via INTRAVENOUS
  Filled 2019-02-04 (×2): qty 200

## 2019-02-04 MED ORDER — ATORVASTATIN CALCIUM 10 MG PO TABS
20.0000 mg | ORAL_TABLET | Freq: Every day | ORAL | Status: DC
  Administered 2019-02-05 – 2019-02-06 (×2): 20 mg via ORAL
  Filled 2019-02-04 (×3): qty 2

## 2019-02-04 MED ORDER — CALCIUM CARBONATE ANTACID 500 MG PO CHEW: 1.0000 | CHEWABLE_TABLET | Freq: Every day | ORAL | Status: DC | PRN

## 2019-02-04 MED ORDER — IPRATROPIUM-ALBUTEROL 0.5-2.5 (3) MG/3ML IN SOLN: 3.0000 mL | Freq: Four times a day (QID) | RESPIRATORY_TRACT | Status: DC | PRN

## 2019-02-04 MED ORDER — SODIUM CHLORIDE 0.9% FLUSH
3.0000 mL | Freq: Two times a day (BID) | INTRAVENOUS | Status: DC
  Administered 2019-02-04 – 2019-02-07 (×5): 3 mL via INTRAVENOUS

## 2019-02-04 MED ORDER — SODIUM CHLORIDE 0.9% FLUSH: 3.0000 mL | INTRAVENOUS | Status: DC | PRN

## 2019-02-04 MED ORDER — MAGNESIUM SULFATE 2 GM/50ML IV SOLN
2.0000 g | Freq: Once | INTRAVENOUS | Status: AC
  Administered 2019-02-04: 2 g via INTRAVENOUS
  Filled 2019-02-04: qty 50

## 2019-02-04 MED ORDER — DIGOXIN 125 MCG PO TABS
0.2500 mg | ORAL_TABLET | Freq: Every day | ORAL | Status: DC
  Administered 2019-02-05 – 2019-02-07 (×3): 0.25 mg via ORAL
  Filled 2019-02-04 (×3): qty 2

## 2019-02-04 MED ORDER — MAGNESIUM SULFATE 2 GM/50ML IV SOLN: 2.0000 g | Freq: Once | INTRAVENOUS | Status: DC

## 2019-02-04 MED ORDER — TRAMADOL HCL 50 MG PO TABS: 50.0000 mg | ORAL_TABLET | Freq: Three times a day (TID) | ORAL | Status: DC | PRN

## 2019-02-04 MED ORDER — DIGOXIN 0.25 MG/ML IJ SOLN
0.2500 mg | Freq: Once | INTRAMUSCULAR | Status: AC
  Administered 2019-02-04: 0.25 mg via INTRAVENOUS
  Filled 2019-02-04: qty 2

## 2019-02-04 MED ORDER — AMIODARONE LOAD VIA INFUSION
150.0000 mg | Freq: Once | INTRAVENOUS | Status: AC
  Administered 2019-02-04: 150 mg via INTRAVENOUS
  Filled 2019-02-04: qty 83.34

## 2019-02-04 MED ORDER — METOPROLOL SUCCINATE ER 100 MG PO TB24
100.0000 mg | ORAL_TABLET | Freq: Every day | ORAL | Status: DC
  Administered 2019-02-04 – 2019-02-07 (×4): 100 mg via ORAL
  Filled 2019-02-04 (×4): qty 1

## 2019-02-04 MED ORDER — AMIODARONE HCL IN DEXTROSE 360-4.14 MG/200ML-% IV SOLN
30.0000 mg/h | INTRAVENOUS | Status: DC
  Administered 2019-02-05 – 2019-02-06 (×3): 30 mg/h via INTRAVENOUS
  Filled 2019-02-04 (×4): qty 200

## 2019-02-04 MED ORDER — DIGOXIN 0.25 MG/ML IJ SOLN
0.2500 mg | Freq: Once | INTRAMUSCULAR | Status: DC
  Filled 2019-02-04: qty 2

## 2019-02-04 MED ORDER — TRAMADOL HCL 50 MG PO TABS
50.0000 mg | ORAL_TABLET | Freq: Three times a day (TID) | ORAL | Status: DC | PRN
  Administered 2019-02-04 – 2019-02-05 (×2): 50 mg via ORAL
  Filled 2019-02-04 (×2): qty 1

## 2019-02-04 MED ORDER — SODIUM CHLORIDE 0.9 % IV SOLN: 250.0000 mL | INTRAVENOUS | Status: DC | PRN

## 2019-02-04 MED ORDER — ACETAMINOPHEN 325 MG PO TABS
650.0000 mg | ORAL_TABLET | ORAL | Status: DC | PRN
  Administered 2019-02-05 – 2019-02-06 (×2): 650 mg via ORAL
  Filled 2019-02-04 (×2): qty 2

## 2019-02-04 MED ORDER — DABIGATRAN ETEXILATE MESYLATE 150 MG PO CAPS
150.0000 mg | ORAL_CAPSULE | Freq: Two times a day (BID) | ORAL | Status: DC
  Administered 2019-02-04 – 2019-02-07 (×8): 150 mg via ORAL
  Filled 2019-02-04 (×9): qty 1

## 2019-02-04 MED ORDER — HYDROMORPHONE HCL 1 MG/ML IJ SOLN
0.5000 mg | INTRAMUSCULAR | Status: DC | PRN
  Administered 2019-02-04: 0.5 mg via INTRAVENOUS
  Filled 2019-02-04: qty 1

## 2019-02-04 MED ORDER — GABAPENTIN 300 MG PO CAPS
300.0000 mg | ORAL_CAPSULE | Freq: Three times a day (TID) | ORAL | Status: DC
  Administered 2019-02-04 – 2019-02-07 (×9): 300 mg via ORAL
  Filled 2019-02-04 (×5): qty 1
  Filled 2019-02-04: qty 3
  Filled 2019-02-04 (×4): qty 1

## 2019-02-04 MED ORDER — POTASSIUM CHLORIDE CRYS ER 20 MEQ PO TBCR
20.0000 meq | EXTENDED_RELEASE_TABLET | Freq: Every day | ORAL | Status: DC
  Administered 2019-02-04 – 2019-02-07 (×4): 20 meq via ORAL
  Filled 2019-02-04 (×3): qty 1
  Filled 2019-02-04: qty 2

## 2019-02-04 MED ORDER — IPRATROPIUM-ALBUTEROL 20-100 MCG/ACT IN AERS: 1.0000 | INHALATION_SPRAY | Freq: Four times a day (QID) | RESPIRATORY_TRACT | Status: DC | PRN

## 2019-02-04 MED ORDER — PANTOPRAZOLE SODIUM 40 MG PO TBEC
40.0000 mg | DELAYED_RELEASE_TABLET | Freq: Every day | ORAL | Status: DC
  Administered 2019-02-04 – 2019-02-07 (×4): 40 mg via ORAL
  Filled 2019-02-04: qty 1
  Filled 2019-02-04: qty 2
  Filled 2019-02-04 (×2): qty 1

## 2019-02-04 MED ORDER — FUROSEMIDE 10 MG/ML IJ SOLN
40.0000 mg | Freq: Two times a day (BID) | INTRAMUSCULAR | Status: DC
  Administered 2019-02-04 (×2): 40 mg via INTRAVENOUS
  Filled 2019-02-04 (×2): qty 4

## 2019-02-04 MED ORDER — INFLUENZA VAC A&B SA ADJ QUAD 0.5 ML IM PRSY
0.5000 mL | PREFILLED_SYRINGE | INTRAMUSCULAR | Status: AC
  Administered 2019-02-07: 0.5 mL via INTRAMUSCULAR
  Filled 2019-02-04: qty 0.5

## 2019-02-04 MED ORDER — ONDANSETRON HCL 4 MG/2ML IJ SOLN
4.0000 mg | Freq: Four times a day (QID) | INTRAMUSCULAR | Status: DC | PRN
  Administered 2019-02-04: 4 mg via INTRAVENOUS
  Filled 2019-02-04: qty 2

## 2019-02-04 NOTE — Progress Notes (Signed)
PT Cancellation Note  Patient Details Name: Glenn [name] MRN: [id number] DOB: Mar 08, 1950   Cancelled Treatment:    Reason Eval/Treat Not Completed: Other (comment).  Pt was doing intake with nursing and will retry at a later time.   Ramond Dial 02/04/2019, 4:02 PM   Mee Hives, PT MS Acute Rehab Dept. Number: [place]

## 2019-02-04 NOTE — ED Notes (Signed)
Pt called out stating he felt sob, pts O2 sat 95%, patient placed on 2L O2 via [place] for comfort. Pt also stated he had a bad taste in his mouth. Provided ginger ale and crackers to patient. Pt reports feeling a little better with food and O2.

## 2019-02-04 NOTE — ED Notes (Signed)
Pt transported to US

## 2019-02-04 NOTE — Consult Note (Addendum)
Cardiology Consultation:   Patient ID: [name] MRN: [id number]; DOB: 1951/01/14  Admit date: 02/03/2019 Date of Consult: 02/04/2019  Primary Care Provider: [name], MD Primary Cardiologist: Virl Axe, MD  Primary Electrophysiologist:  [name]   Patient Profile:   Glenn [name] is a 68 y.o. male with a hx of ICD for NICM, hx of a fib (prior cardioversions) and CVA- treated with chemical thrombectomy and on Eliquis , also HTN, HF and Hepatitis C (pt moved here from [place] where he was treated for a fib and ICD placed [employer]) who is being seen today for the evaluation of CHF and rapid a fib at the request of Dr Algis Liming.  History of Present Illness:   Mr. Cagle with above hx and on tikosyn with recurrent a fib 10/23/18 and underwent DCCV 10/29/18 to SR then readmitted later that night with SOB- diuresed then back into a fib for brief time.  He underwent Rt and Lt cardiac cath 11/03/18 with patent coronary arteries. Was back in a fib RVR, and elevated filling pressures      New decline in EF to 15%.   Repeat DCCV 11/05/18 to SR. Pt needed reduced dose of Tikosyn due to prolonged Qtc.    Readmitted 11/10/18 with acute stroke.  CT showed large territory of subacute hemorrhagic infarct in the right MCA territory. Area of hemorrhage is smaller than the bed of the ischemic stroke and measures approximately 2 x 3 cm. Thinks the blood is subacute.  Pt was back in a flutter.  Eliquis was on hold due to neuro so Tikosyn also stopped.  He went to rehab.  Pt also with DVT extensive so IVC filter placed- 12/04/18.   He also developed a fib RVR  IV amiodarone added.  Amiodarone did not control rate so placed on dilt and dig. And his BB.   Further condition complicated by GB with drain and sepsis due to acute cholecystitis.   By 12/23/18 pt placed on pradaxa 150 BID for anticoagulation and discharged 12/24/18 with plan for rate control on a fib.  Discharged to [facility].    On  12/10 on outpt ICD monitoring he was RVR in his now permanent a fib.   He now presents to ER late last pm with SOB that has increased from last week.  He has also been out of lasix.   He tells me [name] did not give pradaxa at discharge.    In ER BNP 922, HR was rapid  Mg+ 1.5 Na 140, K+ 3.7, Cr 1.09, WBC 4.7, Hgb 10.9, pts 173 covid Neg  EKG:  The EKG was personally reviewed and demonstrates:  A flutter rate 151 no acute ST changes from Oct 2020 Telemetry:  Telemetry was personally reviewed and demonstrates:  A fib rate 110-135   2 V CXR  IMPRESSION: Persistent right basilar infiltrate. Increasing left basilar atelectasis or infiltrate.  Cardiomegaly  Given IV dilt but now on IV amiodarone Has rec'd 2 doses of lasix IV with neg 709.  His pradaxa has been resumed.  He has not had drain to gallbladder evaluated by surgery per pt.    Heart Pathway Score:     Past Medical History:  Diagnosis Date   Atrial fibrillation [facility])    CHF (congestive heart failure) ([place])    Hepatitis C    Hypertension    Stroke [facility])    Visit for monitoring Tikosyn therapy 03/26/2017    Past Surgical History:  Procedure Laterality  Date   CARDIAC DEFIBRILLATOR PLACEMENT  2015   CARDIOVERSION N/A 10/10/2016   Procedure: CARDIOVERSION;  Surgeon: Dorothy Spark, MD;  Location: [place];  Service: Cardiovascular;  Laterality: N/A;   CARDIOVERSION N/A 03/27/2017   Procedure: CARDIOVERSION;  Surgeon: Jerline Pain, MD;  Location: [place];  Service: Cardiovascular;  Laterality: N/A;   CARDIOVERSION N/A 10/29/2018   Procedure: CARDIOVERSION;  Surgeon: Sanda Klein, MD;  Location: [place];  Service: Cardiovascular;  Laterality: N/A;   CARDIOVERSION N/A 11/05/2018   Procedure: CARDIOVERSION;  Surgeon: Nahser, Wonda Cheng, MD;  Location: [facility] ENDOSCOPY;  Service: Cardiovascular;  Laterality: N/A;   EYE SURGERY Left 1990   IR IVC FILTER PLMT / S&I /IMG GUID/MOD SED  12/04/2018   IR PERC  CHOLECYSTOSTOMY  12/13/2018   IR PERCUTANEOUS ART THROMBECTOMY/INFUSION INTRACRANIAL INC DIAG ANGIO  09/05/2016   IR RADIOLOGIST EVAL & MGMT  10/03/2016   RADIOLOGY WITH ANESTHESIA N/A 09/05/2016   Procedure: RADIOLOGY WITH ANESTHESIA;  Surgeon: Luanne Bras, MD;  Location: [place];  Service: Radiology;  Laterality: N/A;   RIGHT/LEFT HEART CATH AND CORONARY ANGIOGRAPHY N/A 11/03/2018   Procedure: RIGHT/LEFT HEART CATH AND CORONARY ANGIOGRAPHY;  Surgeon: Lorretta Harp, MD;  Location: [place] CV LAB;  Service: Cardiovascular;  Laterality: N/A;     Home Medications:  Prior to Admission medications   Medication Sig Start Date End Date Taking? Authorizing Provider  acetaminophen (TYLENOL) 325 MG tablet Take 2 tablets (650 mg total) by mouth every 4 (four) hours as needed for mild pain (or temp > 37.5 C (99.5 F)). 12/02/18  Yes Angiulli, Lavon Paganini, PA-C  atorvastatin (LIPITOR) 20 MG tablet Take 1 tablet (20 mg total) by mouth daily at 6 PM. 11/13/18  Yes Biby, Massie Kluver, NP  calcium carbonate (TUMS - DOSED IN MG ELEMENTAL CALCIUM) 500 MG chewable tablet Chew 1 tablet (200 mg of elemental calcium total) by mouth daily as needed for indigestion or heartburn. 12/24/18  Yes Shahmehdi, Valeria Batman, MD  dabigatran ([name]) 150 MG CAPS capsule Take 1 capsule (150 mg total) by mouth every 12 (twelve) hours. 12/24/18  Yes Shahmehdi, Seyed A, MD  furosemide (LASIX) 40 MG tablet Take 1 tablet (40 mg total) by mouth daily. 11/06/18  Yes Mullis, Kiersten P, DO  gabapentin (NEURONTIN) 300 MG capsule Take 1 capsule (300 mg total) by mouth every 8 (eight) hours. 12/24/18 02/04/28 Yes Shahmehdi, Seyed A, MD  ipratropium (ATROVENT) 0.02 % nebulizer solution Take 2.5 mLs (0.5 mg total) by nebulization every 6 (six) hours as needed for wheezing or shortness of breath. 12/24/18  Yes Shahmehdi, Valeria Batman, MD  levalbuterol (XOPENEX) 0.63 MG/3ML nebulizer solution Take 3 mLs (0.63 mg total) by nebulization every 6 (six) hours as  needed for wheezing or shortness of breath. 12/24/18  Yes Shahmehdi, Seyed A, MD  metoprolol succinate (TOPROL-XL) 100 MG 24 hr tablet Take 1 tablet (100 mg total) by mouth daily. Take with or immediately following a meal. 12/24/18  Yes Shahmehdi, Seyed A, MD  pantoprazole (PROTONIX) 40 MG tablet Take 1 tablet (40 mg total) by mouth daily. 12/24/18 02/04/28 Yes Shahmehdi, Seyed A, MD  potassium chloride SA (K-DUR) 20 MEQ tablet Take 1 tablet (20 mEq total) by mouth daily. 11/06/18  Yes Mullis, Kiersten P, DO  spironolactone (ALDACTONE) 25 MG tablet Take 1 tablet (25 mg total) by mouth daily. 12/24/18 02/04/28 Yes Shahmehdi, Valeria Batman, MD  traMADol (ULTRAM) 50 MG tablet Take 1 tablet (50 mg total) by mouth every  8 (eight) hours as needed for severe pain. 12/02/18  Yes Angiulli, Lavon Paganini, PA-C  feeding supplement, ENSURE ENLIVE, (ENSURE ENLIVE) LIQD Take 237 mLs by mouth 3 (three) times daily between meals. Patient not taking: Reported on 02/04/2019 12/24/18   Deatra James, MD  lidocaine (LIDODERM) 5 % Place 1 patch onto the skin daily. Remove & Discard patch within 12 hours or as directed by MD Patient not taking: Reported on 02/04/2019 12/24/18   Deatra James, MD    Inpatient Medications: Scheduled Meds:  atorvastatin  20 mg Oral q1800   dabigatran  150 mg Oral Q12H   furosemide  40 mg Intravenous Q12H   gabapentin  300 mg Oral Q8H   metoprolol succinate  100 mg Oral Daily   pantoprazole  40 mg Oral Daily   potassium chloride SA  20 mEq Oral Daily   sodium chloride flush  3 mL Intravenous Q12H   Continuous Infusions:  sodium chloride     amiodarone 30 mg/hr (02/04/19 0923)   PRN Meds: sodium chloride, acetaminophen, calcium carbonate, HYDROmorphone (DILAUDID) injection, ipratropium-albuterol, ondansetron (ZOFRAN) IV, sodium chloride flush, traMADol  Allergies:    Allergies  Allergen Reactions   Benadryl [Diphenhydramine] Palpitations    Social History:   Social History    Socioeconomic History   Marital status: Single    Spouse name: Not on file   Number of children: Not on file   Years of education: 39 (some college)   Highest education level: Not on file  Occupational History   Occupation: disability  Tobacco Use   Smoking status: Current Some Day Smoker    Packs/day: 0.50    Types: Cigarettes   Smokeless tobacco: Never Used   Tobacco comment: a pack last three days  Substance and Sexual Activity   Alcohol use: Not Currently    Alcohol/week: 3.0 standard drinks    Types: 3 Cans of beer per week    Comment: pt stop drinking    Drug use: Yes    Frequency: 2.0 times per week    Types: Marijuana    Comment: stop smoking    Sexual activity: Yes    Partners: Female    Birth control/protection: Condom  Other Topics Concern   Not on file  Social History Narrative   Not on file   Social Determinants of Health   Financial Resource Strain:    Difficulty of Paying Living Expenses: Not on file  Food Insecurity:    Worried About Charity fundraiser in the Last Year: Not on file   [employer] of Food in the Last Year: Not on file  Transportation Needs:    Lack of Transportation (Medical): Not on file   Lack of Transportation (Non-Medical): Not on file  Physical Activity:    Days of Exercise per Week: Not on file   Minutes of Exercise per Session: Not on file  Stress:    Feeling of Stress : Not on file  Social Connections:    Frequency of Communication with Friends and Family: Not on file   Frequency of Social Gatherings with Friends and Family: Not on file   Attends Religious Services: Not on file   Active Member of Clubs or Organizations: Not on file   Attends Archivist Meetings: Not on file   Marital Status: Not on file  Intimate Partner Violence:    Fear of Current or Ex-Partner: Not on file   Emotionally Abused: Not on file   Physically  Abused: Not on file   Sexually Abused: Not on file    Family History:    Family  History  Problem Relation Age of Onset   High blood pressure Mother    High blood pressure Father    Stroke Maternal Aunt    Heart disease Neg Hx      ROS:  Please see the history of present illness.  General:no colds or fevers, no weight changes Skin:no rashes or ulcers HEENT:no blurred vision, no congestion CV:see HPI PUL:see HPI GI:no diarrhea constipation or melena, no indigestion GU:no hematuria, no dysuria MS:no joint pain, no claudication Neuro:no syncope, no lightheadedness, moving all extremities  Endo:no diabetes, no thyroid disease  All other ROS reviewed and negative.     Physical Exam/Data:   Vitals:   02/04/19 0945 02/04/19 1000 02/04/19 1009 02/04/19 1030  BP: 112/85 (!) 106/92  124/78  Pulse:   (!) 118 (!) 57  Resp: 16   16  Temp:      TempSrc:      SpO2:    95%    Intake/Output Summary (Last 24 hours) at 02/04/2019 1046 Last data filed at 02/04/2019 0809 Gross per 24 hour  Intake 30.12 ml  Output 740 ml  Net -709.88 ml   Last 3 Weights 12/24/2018 12/22/2018 12/21/2018  Weight (lbs) 167 lb 5.3 oz 177 lb 12.8 oz 169 lb 12.8 oz  Weight (kg) 75.9 kg 80.65 kg 77.021 kg     There is no height or weight on file to calculate BMI.  General:  Well nourished, well developed, in no acute distress HEENT: normal Lymph: no adenopathy Neck: no JVD Endocrine:  No thryomegaly Vascular: No carotid bruits; pedal pulses 2+ bilaterally  Cardiac: irreg irreg; no murmur gallup or rub Lungs:  diminished to auscultation bilaterally, no wheezing, rhonchi or rales  Abd: soft, nontender, no hepatomegaly  Ext: + Lt lower ext edema with hx of Lt DVT and IVC filter and acute superficial vein thrombosis involving the left cephalic vein.  Musculoskeletal:  No deformities, BUE and BLE strength normal and equal Skin: warm and dry  Neuro:  CNs 2-12 intact, no focal abnormalities noted Psych:  Normal affect   Relevant CV Studies:  ECHO  12/07/18 1. Left ventricular ejection  fraction, by visual estimation, is 20%. The left ventricle has severely decreased function. Mildly increased left ventricular size. There is mildly increased left ventricular hypertrophy. 2. Left ventricular diastolic Doppler parameters are indeterminate in the setting of rapid atrial fibrillation. 3. Global right ventricle has mildly reduced systolic function.The right ventricular size is normal. No increase in right ventricular wall thickness. 4. Left atrial size was mild-moderately dilated. 5. Right atrial size was normal. 6. Trivial pericardial effusion is present. 7. The pericardial effusion is posterior to the left ventricle. 8. Mild aortic valve annular calcification. 9. The mitral valve is grossly normal. Mild mitral valve regurgitation. 10. The tricuspid valve is grossly normal. Tricuspid valve regurgitation mild-moderate. 11. The aortic valve is tricuspid Aortic valve regurgitation is mild to moderate by color flow Doppler. 12. The pulmonic valve was grossly normal. Pulmonic valve regurgitation is mild by color flow Doppler. 13. Moderately elevated pulmonary artery systolic pressure. 14. The inferior vena cava is normal in size with greater than 50% respiratory variability, suggesting right atrial pressure of 3 mmHg. 15. A device wire is visualized. 16. The tricuspid regurgitant velocity is 3.68 m/s, and with an assumed right atrial pressure of 3 mmHg, the estimated right ventricular systolic pressure is  moderately    Echo 10/30/18  IMPRESSIONS      1. The left ventricle has severely reduced systolic function, with an ejection fraction of 15-20%. The cavity size was moderately dilated. There is moderate concentric left ventricular hypertrophy. Left ventricular diastolic function could not be  evaluated secondary to atrial fibrillation. Left ventricular diffuse hypokinesis.  2. The right ventricle has moderately reduced systolic function. The cavity was moderately enlarged.  There is no increase in right ventricular wall thickness. Right ventricular systolic pressure is moderately elevated with an estimated pressure of 45.5  mmHg.  3. Left atrial size was moderately dilated.  4. Right atrial size was severely dilated.  5. Tricuspid valve regurgitation is severe.  6. Mild thickening of the aortic valve. Aortic valve regurgitation is moderate by color flow Doppler.  7. The aorta is normal unless otherwise noted.   FINDINGS  Left Ventricle: The left ventricle has severely reduced systolic function, with an ejection fraction of 15-20%. The cavity size was moderately dilated. There is moderate concentric left ventricular hypertrophy. Left ventricular diastolic function could  not be evaluated secondary to atrial fibrillation. Left ventricular diffuse hypokinesis. Definity contrast agent was given IV to delineate the left ventricular endocardial borders.   Right Ventricle: The right ventricle has moderately reduced systolic function. The cavity was moderately enlarged. There is no increase in right ventricular wall thickness. Right ventricular systolic pressure is moderately elevated with an estimated  pressure of 45.5 mmHg. Pacing wire/catheter visualized in the right ventricle.   Left Atrium: Left atrial size was moderately dilated.   Right Atrium: Right atrial size was severely dilated. Right atrial pressure is estimated at 15 mmHg.   Interatrial Septum: No atrial level shunt detected by color flow Doppler.   Pericardium: There is no evidence of pericardial effusion.   Mitral Valve: The mitral valve is normal in structure. Mitral valve regurgitation is mild by color flow Doppler.   Tricuspid Valve: The tricuspid valve is normal in structure. Tricuspid valve regurgitation is severe by color flow Doppler.   Aortic Valve: The aortic valve is normal in structure. Mild thickening of the aortic valve. Aortic valve regurgitation is moderate by color flow Doppler.    Pulmonic Valve: The pulmonic valve was normal in structure. Pulmonic valve regurgitation is mild by color flow Doppler.   Aorta: The aorta is normal unless otherwise noted.   Venous: The inferior vena cava is normal in size with greater than 50% respiratory variability.    Echo-Study Conclusions 2018   - Left ventricle: The cavity size was normal. Systolic function was   moderately to severely reduced. The estimated ejection fraction   was in the range of 30% to 35%. Diffuse hypokinesis. Moderate   hypokinesis of the anteroseptal, inferior, and inferoseptal   myocardium. - Aortic valve: Transvalvular velocity was within the normal range.   There was no stenosis. There was moderate regurgitation. Valve   area (VTI): 2.25 cm^2. Valve area (Vmax): 2.37 cm^2. Valve area   (Vmean): 2.34 cm^2. - Mitral valve: Transvalvular velocity was within the normal range.   There was no evidence for stenosis. There was mild regurgitation. - Left atrium: The atrium was moderately dilated. - Right ventricle: The cavity size was normal. Wall thickness was   normal. Systolic function was mildly reduced. - Right atrium: The atrium was moderately dilated. - Tricuspid valve: There was mild-moderate regurgitation. - Pulmonary arteries: Systolic pressure was mildly increased. PA   peak pressure: 38 mm Hg (S).  Laboratory Data:  High Sensitivity Troponin:  No results for input(s): TROPONINIHS in the last 720 hours.   Chemistry Recent Labs  Lab 02/03/19 1902  NA 140  K 3.7  CL 108  CO2 20*  GLUCOSE 113*  BUN 10  CREATININE 1.09  CALCIUM 9.5  GFRNONAA >60  GFRAA >60  ANIONGAP 12    No results for input(s): PROT, ALBUMIN, AST, ALT, ALKPHOS, BILITOT in the last 168 hours. Hematology Recent Labs  Lab 02/03/19 1902  WBC 4.7  RBC 3.58*  HGB 10.9*  HCT 33.0*  MCV 92.2  MCH 30.4  MCHC 33.0  RDW 14.9  PLT 173   BNP Recent Labs  Lab 02/03/19 2007  BNP 922.2*    DDimer No results for  input(s): DDIMER in the last 168 hours.   Radiology/Studies:  DG Chest 2 View  Result Date: 02/03/2019 CLINICAL DATA:  Shortness of breath EXAM: CHEST - 2 VIEW COMPARISON:  12/09/2018 FINDINGS: Left AICD. Cardiomegaly with vascular congestion. Airspace disease noted in the lower lobes bilaterally, right greater than left. This is slightly increased at the left base, similar on the right since prior study. Suspect small right pleural effusion. No acute bony abnormality. IMPRESSION: Persistent right basilar infiltrate. Increasing left basilar atelectasis or infiltrate. Cardiomegaly. Electronically Signed   By: Rolm Baptise M.D.   On: 02/03/2019 19:26   US Abdomen Limited RUQ  Result Date: 02/04/2019 CLINICAL DATA:  Right upper quadrant pain. Cholecystitis. Cholecystostomy tube. EXAM: ULTRASOUND ABDOMEN LIMITED RIGHT UPPER QUADRANT COMPARISON:  Ultrasound 12/20/2018. FINDINGS: Gallbladder: Gallbladder is decompressed. Multiple shadowing gallstones. Cholecystostomy tube is visualized extending into the decompressed gallbladder. Gallbladder wall thickening of 4 mm. Small amount of pericholecystic fluid. Positive sonographic Murphy sign noted by sonographer. Common bile duct: Diameter: 5 mm. Liver: No focal lesion identified. Within normal limits in parenchymal echogenicity. Portal vein is patent on color Doppler imaging with normal direction of blood flow towards the liver. Other: None. IMPRESSION: 1. Cholecystostomy tube decompressing the gallbladder. Multiple gallstones with gallbladder wall thickening and small amount pericholecystic fluid. There is a positive sonographic Murphy sign. Other than trace pericholecystic fluid, sonographic findings are unchanged from 12/20/2018. 2. No biliary dilatation. Electronically Signed   By: Indie Rake M.D.   On: 02/04/2019 03:04    Assessment and Plan:   A fib with RVR currently 110 to 130s down from 150s - plan had been for rate control alone, due to off  anticoagulation in Sept. Due to hemorrhagic CVA.  Placed back on [name] in Nov per Neuro.   May be helpful to have EP to see to decide on further a fib treatment.  Had been on tikosyn but stopped when changed from rhythm control to rate control.    Will begin digoxin to help with rate control.   Acute systolic HF, not sure if rapid HR brought on HF vs HF then RVR.  EF last EF in 20% - not discharged from [facility] with lasix. Per pt-  On lasix 40 IV BID, continue to diuresis.   Hx of recent hemorrhagic CVA 10/2018 - placed back on [name] 12/23/18 but pt notes not discharged from [facility] place with pradaxa.  DVT Lt lower ext and Lt upper ext. Left cephalic vein with IVC and placement of IVC filter. Recent acute chole with drain in place will need surgical follow up  NICM with last cath 11/03/18 patent coronary arteries.  ICD, [employer] placed in [place] Hypomg+ will replace      For questions  or updates, please contact [place] Please consult [email or web address] for contact info under     Signed, Cecilie Kicks, NP  02/04/2019 10:46 AM  Patient examined chart reviewed Discussed care with patient and PA Exam with black male resting comfortably JVP elevated AICD under Left clavicle PMI increased no murmur lungs clear. JP drain in gallbladder He has been seen extensively by EP/[facility] and afib clinic. Has had multiple Recurrences of afib on tikosyn.although this dose was lowered due to long QTc. Not clear if he had been on oral amiodarone His volume status is not terrible Dyspnea worse with rapid afib. Previously Dr [name] felt that rhythm control better strategy with low EF. Continue coreg and iv amiodarone Add digoxin EP to see and follow in hospital regarding strategies to maintain sinus His device is single lead and cannot tell us % of time he is in afib but presumed more persistent last 3 months   Jenkins Rouge MD [facility]

## 2019-02-04 NOTE — ED Notes (Signed)
Dr. Algis Liming at bedside. MD aware that patient patient's HR intermittently goes up into the 120-130s when patient is moving around in bed. MD advised he is waiting on cardiologist to come see patient.

## 2019-02-04 NOTE — Progress Notes (Signed)
Pt requested I call his son [name] to make him aware he was in a pt room. Son made aware. Also c/o sob aroused him out of his sleep, pt educated on why this may be happening. Placed on [facility] for comfort.

## 2019-02-04 NOTE — ED Notes (Signed)
Supplemental O2 turned off, patient's O2 sats remain 95-100% on room air. resp e/u, nad.

## 2019-02-04 NOTE — Progress Notes (Signed)
Cardiology Moonlighter Note  Contacted by ED regarding patient Glenn [name], who is a patient with AF (complicated by recent intracranial hemorrhage, now back on anticoagulation), DVT, CKD, ischemic CM, HFrEF (20%), HTN, HLD, and recent admission for biliary sepsis s/p percutaneous chole tube who presents with shortness of breath and hypoxemia. He was noted to be in AF with RVR on presentation.   Awaiting results of COVID testing. Would recommend avoiding diltiazem given severely depressed LVEF. If IV medication is needed for rate control of AF, would recommend IV amiodarone. Differential diagnosis for his symptoms is broad, includes infectious (aspiration pneumonia versus COVID-19), pulmonary embolism, decompensated heart failure. Cardiology will see patient once COVID testing results. Patient being admitted by the hospitalist service.    Marcie Mowers, MD Cardiology Fellow, PGY-7

## 2019-02-04 NOTE — H&P (Addendum)
History and Physical    Glenn [name] [id number] DOB: 1950/05/16 DOA: 02/03/2019  PCP: [name], MD   Patient coming from: Home   Chief Complaint: SOB,   HPI: Glenn [name] is a 68 y.o. male with medical history significant for chronic systolic CHF with EF 123456, history of hemorrhagic CVA with left-sided hemiparesis, left leg DVT with IVC filter, atrial fibrillation started back on [name] during recent hospitalization, and cholecystitis with percutaneous cholecystostomy tube in place, now presenting to the emergency department for evaluation of shortness of breath and increased leg swelling.  Patient reports that he has been out of Lasix since returning home from a SNF 1 to 2 weeks ago, had been doing well initially, but has developed worsening shortness of breath over the past 2 days.  He also reports intermittent palpitations over the past 2 days but denies any chest pain.  He has noted increased leg swelling and also reported orthopnea.  He denies any chest pain.  He denies fevers, chills, or cough.  He denies abdominal pain, nausea, or vomiting.  ED Course: Upon arrival to the ED, patient is found to be afebrile, saturating mid 90s on room air, tachypneic, and tachycardic to 160.  EKG features atrial fibrillation with RVR and chest x-ray is notable for cardiomegaly, vascular congestion, persistent right basilar infiltrate, and increased atelectasis or infiltration at the left base.  Chemistry panel is notable for magnesium of 1.6 and CBC features a stable normocytic anemia.  BNP is elevated to 922.  COVID-19 testing is in process.  Patient was given 40 mg IV Lasix, 15 mg IV diltiazem, and was started on diltiazem infusion in the ED.  Cardiology was consulted by the ED physician.  Hospitalist were asked to admit.  Review of Systems:  All other systems reviewed and apart from HPI, are negative.  Past Medical History:  Diagnosis Date  . Atrial fibrillation ([place])   . CHF  (congestive heart failure) ([place])   . Hepatitis C   . Hypertension   . Stroke ([place])   . Visit for monitoring Tikosyn therapy 03/26/2017    Past Surgical History:  Procedure Laterality Date  . CARDIAC DEFIBRILLATOR PLACEMENT  2015  . CARDIOVERSION N/A 10/10/2016   Procedure: CARDIOVERSION;  Surgeon: Dorothy Spark, MD;  Location: [place];  Service: Cardiovascular;  Laterality: N/A;  . CARDIOVERSION N/A 03/27/2017   Procedure: CARDIOVERSION;  Surgeon: Jerline Pain, MD;  Location: [facility] ENDOSCOPY;  Service: Cardiovascular;  Laterality: N/A;  . CARDIOVERSION N/A 10/29/2018   Procedure: CARDIOVERSION;  Surgeon: Sanda Klein, MD;  Location: [facility] ENDOSCOPY;  Service: Cardiovascular;  Laterality: N/A;  . CARDIOVERSION N/A 11/05/2018   Procedure: CARDIOVERSION;  Surgeon: Acie Fredrickson Wonda Cheng, MD;  Location: [facility];  Service: Cardiovascular;  Laterality: N/A;  . EYE SURGERY Left 1990  . IR IVC FILTER PLMT / S&I /IMG GUID/MOD SED  12/04/2018  . IR PERC CHOLECYSTOSTOMY  12/13/2018  . IR PERCUTANEOUS ART THROMBECTOMY/INFUSION INTRACRANIAL INC DIAG ANGIO  09/05/2016  . IR RADIOLOGIST EVAL & MGMT  10/03/2016  . RADIOLOGY WITH ANESTHESIA N/A 09/05/2016   Procedure: RADIOLOGY WITH ANESTHESIA;  Surgeon: Luanne Bras, MD;  Location: [place];  Service: Radiology;  Laterality: N/A;  . RIGHT/LEFT HEART CATH AND CORONARY ANGIOGRAPHY N/A 11/03/2018   Procedure: RIGHT/LEFT HEART CATH AND CORONARY ANGIOGRAPHY;  Surgeon: Lorretta Harp, MD;  Location: [facility];  Service: Cardiovascular;  Laterality: N/A;     reports that he has been smoking cigarettes. He  has been smoking about 0.50 packs per day. He has never used smokeless tobacco. He reports previous alcohol use of about 3.0 standard drinks of alcohol per week. He reports current drug use. Frequency: 2.00 times per week. Drug: Marijuana.  Allergies  Allergen Reactions  . Benadryl [Diphenhydramine] Palpitations    Family History  Problem  Relation Age of Onset  . High blood pressure Mother   . High blood pressure Father   . Stroke Maternal Aunt   . Heart disease Neg Hx      Prior to Admission medications   Medication Sig Start Date End Date Taking? Authorizing Provider  acetaminophen (TYLENOL) 325 MG tablet Take 2 tablets (650 mg total) by mouth every 4 (four) hours as needed for mild pain (or temp > 37.5 C (99.5 F)). 12/02/18  Yes Angiulli, Lavon Paganini, PA-C  atorvastatin (LIPITOR) 20 MG tablet Take 1 tablet (20 mg total) by mouth daily at 6 PM. 11/13/18  Yes Biby, Massie Kluver, NP  calcium carbonate (TUMS - DOSED IN MG ELEMENTAL CALCIUM) 500 MG chewable tablet Chew 1 tablet (200 mg of elemental calcium total) by mouth daily as needed for indigestion or heartburn. 12/24/18  Yes Shahmehdi, Valeria Batman, MD  dabigatran ([name]) 150 MG CAPS capsule Take 1 capsule (150 mg total) by mouth every 12 (twelve) hours. 12/24/18  Yes Shahmehdi, Seyed A, MD  furosemide (LASIX) 40 MG tablet Take 1 tablet (40 mg total) by mouth daily. 11/06/18  Yes Mullis, Kiersten P, DO  gabapentin (NEURONTIN) 300 MG capsule Take 1 capsule (300 mg total) by mouth every 8 (eight) hours. 12/24/18 02/04/28 Yes Shahmehdi, Seyed A, MD  ipratropium (ATROVENT) 0.02 % nebulizer solution Take 2.5 mLs (0.5 mg total) by nebulization every 6 (six) hours as needed for wheezing or shortness of breath. 12/24/18  Yes Shahmehdi, Valeria Batman, MD  levalbuterol (XOPENEX) 0.63 MG/3ML nebulizer solution Take 3 mLs (0.63 mg total) by nebulization every 6 (six) hours as needed for wheezing or shortness of breath. 12/24/18  Yes Shahmehdi, Seyed A, MD  metoprolol succinate (TOPROL-XL) 100 MG 24 hr tablet Take 1 tablet (100 mg total) by mouth daily. Take with or immediately following a meal. 12/24/18  Yes Shahmehdi, Seyed A, MD  pantoprazole (PROTONIX) 40 MG tablet Take 1 tablet (40 mg total) by mouth daily. 12/24/18 02/04/28 Yes Shahmehdi, Seyed A, MD  potassium chloride SA (K-DUR) 20 MEQ tablet Take 1 tablet  (20 mEq total) by mouth daily. 11/06/18  Yes Mullis, Kiersten P, DO  spironolactone (ALDACTONE) 25 MG tablet Take 1 tablet (25 mg total) by mouth daily. 12/24/18 02/04/28 Yes Shahmehdi, Valeria Batman, MD  traMADol (ULTRAM) 50 MG tablet Take 1 tablet (50 mg total) by mouth every 8 (eight) hours as needed for severe pain. 12/02/18  Yes Angiulli, Lavon Paganini, PA-C  feeding supplement, ENSURE ENLIVE, (ENSURE ENLIVE) LIQD Take 237 mLs by mouth 3 (three) times daily between meals. Patient not taking: Reported on 02/04/2019 12/24/18   Deatra James, MD  lidocaine (LIDODERM) 5 % Place 1 patch onto the skin daily. Remove & Discard patch within 12 hours or as directed by MD Patient not taking: Reported on 02/04/2019 12/24/18   Deatra James, MD    Physical Exam: Vitals:   02/03/19 2130 02/03/19 2227 02/04/19 0000 02/04/19 0033  BP: (!) 160/95 (!) 141/108 131/82 (!) 142/88  Pulse: (!) 180 (!) 50  64  Resp: (!) 30 (!) 24 (!) 21 19  Temp:      TempSrc:  SpO2: 98% 96%  94%    Constitutional: NAD, calm  Eyes: PERTLA, lids and conjunctivae normal ENMT: Mucous membranes are moist. Posterior pharynx clear of any exudate or lesions.   Neck: normal, supple, no masses, no thyromegaly Respiratory: Tachypnea. Speaking full sentences. No pallor or cyanosis.  Cardiovascular: Rate ~120. Bilateral leg swelling Lt >> Rt.   Abdomen: Soft, no tenderness, drainage tube in place. Bowel sounds active.  Musculoskeletal: no clubbing / cyanosis. No joint deformity upper and lower extremities.  Skin: no significant rashes, lesions, ulcers. Warm, dry, well-perfused. Neurologic: Dysarthria. Sensation intact. Left-sided hemiparesis.  Psychiatric: Alert and oriented to person, place, and situation. Calm, cooperative.     Labs on Admission: I have personally reviewed following labs and imaging studies  CBC: Recent Labs  Lab 02/03/19 1902  WBC 4.7  HGB 10.9*  HCT 33.0*  MCV 92.2  PLT [id number]   Basic Metabolic Panel:  Recent Labs  Lab 02/03/19 1902 02/03/19 2006  NA 140  --   K 3.7  --   CL 108  --   CO2 20*  --   GLUCOSE 113*  --   BUN 10  --   CREATININE 1.09  --   CALCIUM 9.5  --   MG  --  1.6*   GFR: CrCl cannot be calculated (Unknown ideal weight.). Liver Function Tests: No results for input(s): AST, ALT, ALKPHOS, BILITOT, PROT, ALBUMIN in the last 168 hours. No results for input(s): LIPASE, AMYLASE in the last 168 hours. No results for input(s): AMMONIA in the last 168 hours. Coagulation Profile: No results for input(s): INR, PROTIME in the last 168 hours. Cardiac Enzymes: No results for input(s): CKTOTAL, CKMB, CKMBINDEX, TROPONINI in the last 168 hours. BNP (last 3 results) No results for input(s): PROBNP in the last 8760 hours. HbA1C: No results for input(s): HGBA1C in the last 72 hours. CBG: No results for input(s): GLUCAP in the last 168 hours. Lipid Profile: No results for input(s): CHOL, HDL, LDLCALC, TRIG, CHOLHDL, LDLDIRECT in the last 72 hours. Thyroid Function Tests: No results for input(s): TSH, T4TOTAL, FREET4, T3FREE, THYROIDAB in the last 72 hours. Anemia Panel: No results for input(s): VITAMINB12, FOLATE, FERRITIN, TIBC, IRON, RETICCTPCT in the last 72 hours. Urine analysis:    Component Value Date/Time   COLORURINE AMBER (A) 12/05/2018 0227   APPEARANCEUR HAZY (A) 12/05/2018 0227   LABSPEC 1.040 (H) 12/05/2018 0227   PHURINE 5.0 12/05/2018 0227   GLUCOSEU NEGATIVE 12/05/2018 0227   HGBUR SMALL (A) 12/05/2018 0227   BILIRUBINUR NEGATIVE 12/05/2018 0227   KETONESUR NEGATIVE 12/05/2018 0227   PROTEINUR 100 (A) 12/05/2018 0227   NITRITE NEGATIVE 12/05/2018 0227   LEUKOCYTESUR NEGATIVE 12/05/2018 0227   Sepsis Labs: @LABRCNTIP (procalcitonin:4,lacticidven:4) )No results found for this or any previous visit (from the past 240 hour(s)).   Radiological Exams on Admission: DG Chest 2 View  Result Date: 02/03/2019 CLINICAL DATA:  Shortness of breath EXAM:  CHEST - 2 VIEW COMPARISON:  12/09/2018 FINDINGS: Left AICD. Cardiomegaly with vascular congestion. Airspace disease noted in the lower lobes bilaterally, right greater than left. This is slightly increased at the left base, similar on the right since prior study. Suspect small right pleural effusion. No acute bony abnormality. IMPRESSION: Persistent right basilar infiltrate. Increasing left basilar atelectasis or infiltrate. Cardiomegaly. Electronically Signed   By: Rolm Baptise M.D.   On: 02/03/2019 19:26    EKG: Independently reviewed. Atrial fibrillation with RVR, rate 160.   Assessment/Plan   1. Atrial fibrillation  with RVR  - Presents with SOB, orthopnea, increased leg swelling, and palpitations and is found to be in atrial fibrillation with rate 160  - He was given diltiazem bolus and started on infusion in ED  - Cardiology recommends against diltiazem in light of severely depressed LVEF and advised amiodarone if needed  - Rates persisting >110 and getting into 160 range in ED and amiodarone will be started  - CHADS-VASc at least 3 (age, CHF, HTN) and he has LLE DVT; will continue [name]    2. Acute on chronic systolic CHF  - Presents with SOB, orthopnea, and increased leg swelling  - Echo from October 2020 with EF 20%, mild-moderate LAE, mild MR, mild-mod MR, TR, and AI - He reports being out of Lasix since he was discharged from SNF 1-2 weeks ago; rapid rate may also be contributing  - He was given Lasix 40 mg IV in ED  - Continue diuresis with Lasix 40 mg IV q12h, follow daily wt and I/O's, monitor renal function and electrolytes   3. Cholecystitis   - Percutaneous cholecystostomy tube was placed by IR on 10/24 with plans to remain for 8 wks  - Continue drain care    ADDENDUM: patient began to complain of severe RUQ pain a couple hours after admission. Will assess with RUQ [place].   4. History of hemorrhagic CVA  - This occurred in September 2020, he has residual left-sided motor  deficits and dysarthria, was cleared by neurology to resume anticoagulation (for AF and DVT)   - No new or worsening neuro deficit per patient    5. Left leg DVT - Continue [name]    DVT prophylaxis: [name]  Code Status: Full  Family Communication: Discussed with patient  Consults called: Cardiology  Admission status: Inpatient     Vianne Bulls, MD [facility] Pager [phone]  If 7PM-7AM, please contact night-coverage [email or web address] Password [facility]  02/04/2019, 12:57 AM

## 2019-02-04 NOTE — Progress Notes (Signed)
PROGRESS NOTE   Glenn [name]  [id number]    DOB: 30-Nov-1950    DOA: 02/03/2019  PCP: [name], MD   I have briefly reviewed patients previous medical records in [facility].  Chief Complaint:   Chief Complaint  Patient presents with  . Shortness of Breath    Brief Narrative:  68 year old male with PMH of persistent A. fib, nonischemic cardiomyopathy, chronic systolic CHF with LVEF 123456, ICD, hemorrhagic infarct/CVA 10/2018 following which Eliquis was discontinued and then [name] resumed 12/23/2018, essential hypertension, hepatitis C, left leg DVT s/p IVC filter 12/04/2018, cholecystitis with percutaneous cholecystostomy tube in place, presented to [facility] ED on 02/03/2019 due to dyspnea and worsening leg swelling.  Reportedly been out of Lasix since returning home from SNF 1 to 2 weeks ago and also?  Not on anticoagulants.  Admitted for A. fib with RVR and acute on chronic systolic CHF.  Cardiology consulted.   Assessment & Plan:  Principal Problem:   Atrial fibrillation with RVR ([name]) Active Problems:   Acute on chronic systolic CHF (congestive heart failure) ([name])   HTN (hypertension)   History of hemorrhagic stroke with residual hemiparesis ([name])   Left leg DVT ([name])   Cholecystitis   Persistent A. fib with RVR.  Has failed DCCV in the past, had been on Tikosyn stopped due to prolonged QTC.  Presented with RVR in the 160s.  In ED was given diltiazem bolus and briefly on IV Cardizem infusion.  However due to low EF, changed to IV amiodarone per cardiology recommendations.  CHADS-VASc at least 3 (age, CHF, HTN) and he has LLE DVT; will continue [name] .  [name] reportedly resumed by neurology in November.  Cardiology input appreciated,  currently on Toprol-XL 100 mg daily, IV amiodarone and digoxin added.  They have requested their EP Cardiologist's to see.  Rate improved from 160s to 110s-130s but widely fluctuates.  Acute on chronic systolic  CHF/nonischemic cardiomyopathy/ICD  Last EF 20%.  Last cardiac cath 11/03/2018 showed patent coronaries.    Reportedly not discharged on Lasix after recent SNF admission.  Unsure if rapid A. fib precipitated heart failure or other way around.  Cardiology input appreciated.  Continue IV Lasix 40 mg twice daily.  Improving.  History of hemorrhagic CVA 10/2018  Residual mild left hemiparesis.  Anticoagulation/Eliquis was held and as per neurology, [name] started on 12/23/2018 but reportedly patient not discharged from [facility] with [name].  Continue [name].  Left lower extremity and left upper extremity DVT  S/p IVC filter placement.  Back on anticoagulation/[name].  Acute cholecystitis s/p percutaneous cholecystostomy  Reported some abdominal pain in ED.  Hence RUQ ultrasound obtained which shows cholecystostomy tube decompressing the gallbladder.  Multiple gallstones with gallbladder wall thickening and small amount of pericholecystic fluid.  Positive sonographic Percell Miller sign but unable to elicit same on exam.  Other than trace pericholecystic fluid, sonographic findings unchanged from 12/20/2018.  No biliary dilatation.  Percutaneous cholecystostomy placed by IR on 10/24 with plans to remain for 8 weeks.  Monitor for recurrent symptoms.  Hypomagnesemia  As per ED RN, magnesium of 1.5 this morning was prior to completion of IV magnesium.  Follow in a.m.  Anemia, suspect chronic disease  Stable.  Essential hypertension  Controlled.   DVT prophylaxis: [name] Code Status: Full Family Communication: None at bedside Disposition: DC home pending clinical improvement   Consultants:   Cardiology  Procedures:   Patient has right percutaneous cholecystostomy drain, present prior to admission.  Antimicrobials:  None   Subjective:  Patient seen this morning while still in ED.  Overall feels much better compared to initial arrival.  Denies chest pain, palpitations,  dizziness or lightheadedness.  Dyspnea much improved.  Reports chronic left leg swelling.  As per RN, no acute issues noted.  Objective:   Vitals:   02/04/19 1300 02/04/19 1315 02/04/19 1330 02/04/19 1345  BP: 112/71 105/75 115/73 (!) 147/70  Pulse: (!) 50  94   Resp:   13   Temp:      TempSrc:      SpO2: 95%  100%     General exam: Pleasant middle-age male, moderately built and nourished lying comfortably propped up in bed without distress. Respiratory system: Occasional basal crackles but otherwise clear to auscultation. Cardiovascular system: S1 & S2 heard, irregularly irregular and tachycardic. No JVD, murmurs, rubs, gallops or clicks.  Left leg 1+ pitting edema.  Telemetry personally reviewed: A. fib with RVR and fluctuating ventricular rate between 90s-140s. Gastrointestinal system: Abdomen is nondistended, soft.  Right upper quadrant percutaneous cholecystostomy drain intact without acute findings.  Minimal right upper quadrant tenderness on deep palpation without rigidity, guarding or rebound. No organomegaly or masses felt. Normal bowel sounds heard. Central nervous system: Alert and oriented. No focal neurological deficits. Extremities: Left leg mildly asymmetrically swollen compared to right without acute findings.  Mild left hemiparesis. Skin: No rashes, lesions or ulcers Psychiatry: Judgement and insight appear normal. Mood & affect appropriate.     Data Reviewed:   I have personally reviewed following labs and imaging studies   CBC: Recent Labs  Lab 02/03/19 1902  WBC 4.7  HGB 10.9*  HCT 33.0*  MCV 92.2  PLT [id number]    Basic Metabolic Panel: Recent Labs  Lab 02/03/19 1902 02/03/19 2006 02/04/19 0359  NA 140  --   --   K 3.7  --   --   CL 108  --   --   CO2 20*  --   --   GLUCOSE 113*  --   --   BUN 10  --   --   CREATININE 1.09  --   --   CALCIUM 9.5  --   --   MG  --  1.6* 1.5*    Liver Function Tests: No results for input(s): AST, ALT, ALKPHOS,  BILITOT, PROT, ALBUMIN in the last 168 hours.  CBG: No results for input(s): GLUCAP in the last 168 hours.  Microbiology Studies:   Recent Results (from the past 240 hour(s))  SARS CORONAVIRUS 2 (TAT 6-24 HRS) Nasopharyngeal Nasopharyngeal Swab     Status: None   Collection Time: 02/03/19  9:19 PM   Specimen: Nasopharyngeal Swab  Result Value Ref Range Status   SARS Coronavirus 2 NEGATIVE NEGATIVE Final    Comment: (NOTE) SARS-CoV-2 target nucleic acids are NOT DETECTED. The SARS-CoV-2 RNA is generally detectable in upper and lower respiratory specimens during the acute phase of infection. Negative results do not preclude SARS-CoV-2 infection, do not rule out co-infections with other pathogens, and should not be used as the sole basis for treatment or other patient management decisions. Negative results must be combined with clinical observations, patient history, and epidemiological information. The expected result is Negative. Fact Sheet for Patients: SugarRoll.be Fact Sheet for Healthcare Providers: https://www.woods-mathews.com/ This test is not yet approved or cleared by the [place] FDA and  has been authorized for detection and/or diagnosis of SARS-CoV-2 by FDA under an Emergency Use Authorization (EUA). This EUA  will remain  in effect (meaning this test can be used) for the duration of the COVID-19 declaration under Section 56 4(b)(1) of the Act, 21 U.S.C. section 360bbb-3(b)(1), unless the authorization is terminated or revoked sooner. Performed at [facility], [place] [address], [place],  [address]      Radiology Studies:  DG Chest 2 View  Result Date: 02/03/2019 CLINICAL DATA:  Shortness of breath EXAM: CHEST - 2 VIEW COMPARISON:  12/09/2018 FINDINGS: Left AICD. Cardiomegaly with vascular congestion. Airspace disease noted in the lower lobes bilaterally, right greater than left. This is slightly increased  at the left base, similar on the right since prior study. Suspect small right pleural effusion. No acute bony abnormality. IMPRESSION: Persistent right basilar infiltrate. Increasing left basilar atelectasis or infiltrate. Cardiomegaly. Electronically Signed   By: Rolm Baptise M.D.   On: 02/03/2019 19:26   US Abdomen Limited RUQ  Result Date: 02/04/2019 CLINICAL DATA:  Right upper quadrant pain. Cholecystitis. Cholecystostomy tube. EXAM: ULTRASOUND ABDOMEN LIMITED RIGHT UPPER QUADRANT COMPARISON:  Ultrasound 12/20/2018. FINDINGS: Gallbladder: Gallbladder is decompressed. Multiple shadowing gallstones. Cholecystostomy tube is visualized extending into the decompressed gallbladder. Gallbladder wall thickening of 4 mm. Small amount of pericholecystic fluid. Positive sonographic Murphy sign noted by sonographer. Common bile duct: Diameter: 5 mm. Liver: No focal lesion identified. Within normal limits in parenchymal echogenicity. Portal vein is patent on color Doppler imaging with normal direction of blood flow towards the liver. Other: None. IMPRESSION: 1. Cholecystostomy tube decompressing the gallbladder. Multiple gallstones with gallbladder wall thickening and small amount pericholecystic fluid. There is a positive sonographic Murphy sign. Other than trace pericholecystic fluid, sonographic findings are unchanged from 12/20/2018. 2. No biliary dilatation. Electronically Signed   By: Juvon Rake M.D.   On: 02/04/2019 03:04     Scheduled Meds:   . atorvastatin  20 mg Oral q1800  . dabigatran  150 mg Oral Q12H  . digoxin  0.25 mg Intravenous Once  . [START ON 02/05/2019] digoxin  0.25 mg Oral Daily  . furosemide  40 mg Intravenous Q12H  . gabapentin  300 mg Oral Q8H  . metoprolol succinate  100 mg Oral Daily  . pantoprazole  40 mg Oral Daily  . potassium chloride SA  20 mEq Oral Daily  . sodium chloride flush  3 mL Intravenous Q12H    Continuous Infusions:   . sodium chloride    .  amiodarone 30 mg/hr (02/04/19 0923)  . magnesium sulfate bolus IVPB       LOS: 0 days     Vernell Leep, MD, [place], [facility]. [facility]    To contact the attending provider between 7A-7P or the covering provider during after hours 7P-7A, please log into the web site [email or web address] and access using universal [place] password for that web site. If you do not have the password, please call the hospital operator.  02/04/2019, 1:54 PM

## 2019-02-04 NOTE — Consult Note (Addendum)
ELECTROPHYSIOLOGY CONSULT NOTE    Patient ID: Glenn [name] MRN: [id number], DOB/AGE: [birth year] 68 y.o.  Admit date: 02/03/2019 Date of Consult: 02/04/2019  Primary Physician: [name], MD Primary Cardiologist: Virl Axe, MD  Electrophysiologist: Dr. [name]   Referring Provider: Dr. Johnsie Cancel  Patient Profile: Glenn [name] is a 68 y.o. male with a history of ICD for NICM, Persistent AF previously on tikosyn, CVA, HTN, HF, and Hepatitis C who is being seen today for the evaluation of AF with RVR at the request of Dr. Johnsie Cancel.  HPI:  Glenn [name] is a 68 y.o. male with medical history above. Pt has a very complicated recent history of his AF.   Previously failed tikosyn with recurrent AF 9/3 requiring DCCV. Re-admitted within several days with recurrent AF. He had [facility] 11/03/2018 with patent cors. EF down to 15%. He had repeat DCCV 11/05/18 to SR and tikosyn reduced due to prolonged QTc. He was re-admitted 11/10/18 with acute stroke with hemorraghic infarct. Eliquis held, so tikosyn stopped. He developed an extensive DVT so IVC filter was placed 12/04/2018. Amiodarone added with recurrent AF + BB and digoxin.  He was placed back on [name] 12/23/2018, but reports he was not discharged on it when he went home from [facility] last week.   He presented to [facility] 02/03/2019 evening with worsening SOB x 2 days. No cough or fever. COVID negative. Pertinent labs on admission include K 3.7, Cr 1.09, Mg 1.6, BNP 922.2. He remains short winded. Denies chest pain, lightheadedness, syncope, or near syncope.    Past Medical History:  Diagnosis Date   Atrial fibrillation [facility]    CHF (congestive heart failure) ([place])    Hepatitis C    Hypertension    Stroke [facility])    Visit for monitoring Tikosyn therapy 03/26/2017     Surgical History:  Past Surgical History:  Procedure Laterality Date   CARDIAC DEFIBRILLATOR PLACEMENT  2015   CARDIOVERSION N/A 10/10/2016   Procedure:  CARDIOVERSION;  Surgeon: Dorothy Spark, MD;  Location: [place];  Service: Cardiovascular;  Laterality: N/A;   CARDIOVERSION N/A 03/27/2017   Procedure: CARDIOVERSION;  Surgeon: Jerline Pain, MD;  Location: [place];  Service: Cardiovascular;  Laterality: N/A;   CARDIOVERSION N/A 10/29/2018   Procedure: CARDIOVERSION;  Surgeon: Sanda Klein, MD;  Location: [place];  Service: Cardiovascular;  Laterality: N/A;   CARDIOVERSION N/A 11/05/2018   Procedure: CARDIOVERSION;  Surgeon: Nahser, Wonda Cheng, MD;  Location: [place];  Service: Cardiovascular;  Laterality: N/A;   EYE SURGERY Left 1990   IR IVC FILTER PLMT / S&I /IMG GUID/MOD SED  12/04/2018   IR PERC CHOLECYSTOSTOMY  12/13/2018   IR PERCUTANEOUS ART THROMBECTOMY/INFUSION INTRACRANIAL INC DIAG ANGIO  09/05/2016   IR RADIOLOGIST EVAL & MGMT  10/03/2016   RADIOLOGY WITH ANESTHESIA N/A 09/05/2016   Procedure: RADIOLOGY WITH ANESTHESIA;  Surgeon: Luanne Bras, MD;  Location: [place];  Service: Radiology;  Laterality: N/A;   RIGHT/LEFT HEART CATH AND CORONARY ANGIOGRAPHY N/A 11/03/2018   Procedure: RIGHT/LEFT HEART CATH AND CORONARY ANGIOGRAPHY;  Surgeon: Lorretta Harp, MD;  Location: [place] [facility];  Service: Cardiovascular;  Laterality: N/A;     (Not in a hospital admission)   Inpatient Medications:   atorvastatin  20 mg Oral q1800   dabigatran  150 mg Oral Q12H   digoxin  0.25 mg Intravenous Once   digoxin  0.25 mg Intravenous Once   [START ON 02/05/2019] digoxin  0.25 mg Oral  Daily   furosemide  40 mg Intravenous Q12H   gabapentin  300 mg Oral Q8H   metoprolol succinate  100 mg Oral Daily   pantoprazole  40 mg Oral Daily   potassium chloride SA  20 mEq Oral Daily   sodium chloride flush  3 mL Intravenous Q12H    Allergies:  Allergies  Allergen Reactions   Benadryl [Diphenhydramine] Palpitations    Social History   Socioeconomic History   Marital status: Single    Spouse name: Not on file    Number of children: Not on file   Years of education: 69 (some college)   Highest education level: Not on file  Occupational History   Occupation: disability  Tobacco Use   Smoking status: Current Some Day Smoker    Packs/day: 0.50    Types: Cigarettes   Smokeless tobacco: Never Used   Tobacco comment: a pack last three days  Substance and Sexual Activity   Alcohol use: Not Currently    Alcohol/week: 3.0 standard drinks    Types: 3 Cans of beer per week    Comment: pt stop drinking    Drug use: Yes    Frequency: 2.0 times per week    Types: Marijuana    Comment: stop smoking    Sexual activity: Yes    Partners: Female    Birth control/protection: Condom  Other Topics Concern   Not on file  Social History Narrative   Not on file   Social Determinants of Health   Financial Resource Strain:    Difficulty of Paying Living Expenses: Not on file  Food Insecurity:    Worried About Charity fundraiser in the Last Year: Not on file   [employer] of Food in the Last Year: Not on file  Transportation Needs:    Lack of Transportation (Medical): Not on file   Lack of Transportation (Non-Medical): Not on file  Physical Activity:    Days of Exercise per Week: Not on file   Minutes of Exercise per Session: Not on file  Stress:    Feeling of Stress : Not on file  Social Connections:    Frequency of Communication with Friends and Family: Not on file   Frequency of Social Gatherings with Friends and Family: Not on file   Attends Religious Services: Not on file   Active Member of Clubs or Organizations: Not on file   Attends Archivist Meetings: Not on file   Marital Status: Not on file  Intimate Partner Violence:    Fear of Current or Ex-Partner: Not on file   Emotionally Abused: Not on file   Physically Abused: Not on file   Sexually Abused: Not on file     Family History  Problem Relation Age of Onset   High blood pressure Mother    High blood pressure Father     Stroke Maternal Aunt    Heart disease Neg Hx      Review of Systems: All other systems reviewed and are otherwise negative except as noted above.  Physical Exam: Vitals:   02/04/19 1100 02/04/19 1120 02/04/19 1145 02/04/19 1215  BP: 115/78 (!) 106/95 113/86 120/83  Pulse:      Resp:   17 (!) 23  Temp:      TempSrc:      SpO2: 96% 100%      GEN- The patient is well appearing, alert and oriented x 3 today.   HEENT: normocephalic, atraumatic; sclera clear, conjunctiva  pink; hearing intact; oropharynx clear; neck supple Lungs- Clear to ausculation bilaterally, normal work of breathing.  No wheezes, rales, rhonchi Heart- Regular rate and rhythm, no murmurs, rubs or gallops GI- soft, non-tender, non-distended, bowel sounds present Extremities- no clubbing, cyanosis, or edema; DP/PT/radial pulses 2+ bilaterally MS- no significant deformity or atrophy Skin- warm and dry, no rash or lesion Psych- euthymic mood, full affect Neuro- strength and sensation are intact  Labs:   Lab Results  Component Value Date   WBC 4.7 02/03/2019   HGB 10.9 (L) 02/03/2019   HCT 33.0 (L) 02/03/2019   MCV 92.2 02/03/2019   PLT 173 02/03/2019    Recent Labs  Lab 02/03/19 1902  NA 140  K 3.7  CL 108  CO2 20*  BUN 10  CREATININE 1.09  CALCIUM 9.5  GLUCOSE 113*      Radiology/Studies: DG Chest 2 View  Result Date: 02/03/2019 CLINICAL DATA:  Shortness of breath EXAM: CHEST - 2 VIEW COMPARISON:  12/09/2018 FINDINGS: Left AICD. Cardiomegaly with vascular congestion. Airspace disease noted in the lower lobes bilaterally, right greater than left. This is slightly increased at the left base, similar on the right since prior study. Suspect small right pleural effusion. No acute bony abnormality. IMPRESSION: Persistent right basilar infiltrate. Increasing left basilar atelectasis or infiltrate. Cardiomegaly. Electronically Signed   By: Rolm Baptise M.D.   On: 02/03/2019 19:26   US Abdomen Limited  RUQ  Result Date: 02/04/2019 CLINICAL DATA:  Right upper quadrant pain. Cholecystitis. Cholecystostomy tube. EXAM: ULTRASOUND ABDOMEN LIMITED RIGHT UPPER QUADRANT COMPARISON:  Ultrasound 12/20/2018. FINDINGS: Gallbladder: Gallbladder is decompressed. Multiple shadowing gallstones. Cholecystostomy tube is visualized extending into the decompressed gallbladder. Gallbladder wall thickening of 4 mm. Small amount of pericholecystic fluid. Positive sonographic Murphy sign noted by sonographer. Common bile duct: Diameter: 5 mm. Liver: No focal lesion identified. Within normal limits in parenchymal echogenicity. Portal vein is patent on color Doppler imaging with normal direction of blood flow towards the liver. Other: None. IMPRESSION: 1. Cholecystostomy tube decompressing the gallbladder. Multiple gallstones with gallbladder wall thickening and small amount pericholecystic fluid. There is a positive sonographic Murphy sign. Other than trace pericholecystic fluid, sonographic findings are unchanged from 12/20/2018. 2. No biliary dilatation. Electronically Signed   By: Trysten Rake M.D.   On: 02/04/2019 03:04    EKG: Today shows AF with RVR in 160s (personally reviewed)  TELEMETRY: AF with variable rate control (personally reviewed)  DEVICE HISTORY: [place] Sci ICD at outside facility, 2015  Assessment/Plan: 1.  Persistent Atrial fibrillation with RVR Had been ordered for pradaxa but apparently not sent home with from rehab CHA2DS2VASC of at least 5    Covering with heparin now.  Glenn [name] need to resume Pradaza.  Glenn [name] need TEE/DCCV.  Continue IV amiodarone.   It is unclear which medicines he was actually sent home with from [facility].  Post DCCV, would convert to amiodarone 400 mg daily and plan for close follow up in the AF clinic then with Dr. [name].   2. Acute on chronic systolic CHF s/p [place] Sci single chamber ICD 2015 Likely in the setting of AF with RVR Echo 12/07/2018 showed LVEF  20% Apparently was also discharged from [facility] without lasix.  Continue at least while in AF. Watch renal function.    3. H/o recent hemorrhagic stroke 10/2018 He was placed on [name] 12/23/2018 but not continued on discharge from [facility] last week. Not sure if he was getting it there.  4. Hypomagnesemia 1.5 this am. Supp ordered  ADDENDUM I have set him up for TEE/DCCV 02/06/2019 with Dr. Oval Linsey and orders are signed and held.  I Ovida Delagarza make him NPO after midnight tonight, in the event a spot opens up for tomorrow, 12/17.  He is scheduled to see EP team 03/23/2019 as an outpatient, and I have requested close AF clinic follow up s/p TEE/DCCV.     For questions or updates, please contact [place] Please consult [email or web address] for contact info under Cardiology/STEMI.  Signed, [name], PA-C  02/04/2019 12:34 PM  I have seen and examined this patient with [name].  Agree with above, note added to reflect my findings.  On exam, tachycardic, irregular, lungs clear.  Patient presented to the hospital complaining of shortness of breath.  He is unfortunately gone into atrial fibrillation.  He has a previous history of CVA with hemorrhagic conversion and required outpatient rehab in a facility.  He had previously been on amiodarone, and Tikosyn prior to that.  He has been off of his anticoagulation for at least a week.  We Shaivi Rothschild restart his [name] today.  We Vickie Melnik plan for reload with amiodarone.  He Besnik Febus likely need TEE and cardioversion to get back into normal rhythm.  We Renay Crammer arrange for follow-up in EP clinic for further discussion of rhythm control.  He Erskine Steinfeldt need to 400 mg of amiodarone daily at discharge.  Nattie Lazenby M. Lyrick Worland MD 02/04/2019 3:10 PM

## 2019-02-04 NOTE — ED Notes (Signed)
Lunch tray ordered 

## 2019-02-04 NOTE — ED Notes (Signed)
Pt assisted to call son on the phone per his request.

## 2019-02-04 NOTE — ED Notes (Signed)
Ordered breakfast--Glenn [name] 

## 2019-02-04 NOTE — Progress Notes (Signed)
ANTICOAGULATION CONSULT NOTE - Initial Consult  Pharmacy Consult for Pradax Indication: afib and h/o DVT   Allergies  Allergen Reactions  . Benadryl [Diphenhydramine] Palpitations    Patient Measurements:    Vital Signs: BP: 114/86 (12/16 1415) Pulse Rate: 94 (12/16 1330)  Labs: Recent Labs    02/03/19 1902  HGB 10.9*  HCT 33.0*  PLT 173  CREATININE 1.09    CrCl cannot be calculated (Unknown ideal weight.).   Medical History: Past Medical History:  Diagnosis Date  . Atrial fibrillation ([place])   . CHF (congestive heart failure) ([place])   . Hepatitis C   . Hypertension   . Stroke ([place])   . Visit for monitoring Tikosyn therapy 03/26/2017    Assessment:  Anticoag: [name] PTA for Afib and hx DVT in Oct 2020. Continued inpt. LD 12/15 at 0900. CHAD2-VASc 4. [place] 1.09. Hgb 10.9. Plts 173 - Hx of hemorrhagic CVA in 2020  Goal of Therapy:  Therapeutic oral anticoagulation   Plan:  Cont pradaxa 150 q12h    Rockford Leinen S. Alford Highland, PharmD, [place] Clinical Staff Pharmacist [place] 02/04/2019,2:59 PM

## 2019-02-04 NOTE — ED Notes (Signed)
Dr. Algis Liming paged to be made aware that mag levels were drawn prior to patient receiving replacement magnesium. This RN inquired if second 2g bag of mag needed to be given prior to magensium levels being rechecked. MD advised he would cancel second 2g bag of magnesium.

## 2019-02-04 NOTE — ED Notes (Signed)
NP Cecilie Kicks paged to be made aware that pt has another pending order for 2g magnesium sulfate and that this RN  had spoken with Dr. Algis Liming this morning and made MD aware that both mag levels were drawn prior to supplemental mag being administered. This RN inquired if NP would like a mag level drawn prior to 2g mag being administered since no repeat mag levels were checked after magnesium was given. Magnesium level ordered by NP, magnesium administration held at this time.

## 2019-02-05 ENCOUNTER — Telehealth: Payer: Self-pay

## 2019-02-05 ENCOUNTER — Inpatient Hospital Stay: Payer: 59 | Admitting: Adult Health

## 2019-02-05 DIAGNOSIS — I509 Heart failure, unspecified: Secondary | ICD-10-CM

## 2019-02-05 LAB — CBC
HCT: 34.1 % — ABNORMAL LOW (ref 39.0–52.0)
Hemoglobin: 11 g/dL — ABNORMAL LOW (ref 13.0–17.0)
MCH: 30.1 pg (ref 26.0–34.0)
MCHC: 32.3 g/dL (ref 30.0–36.0)
MCV: 93.2 fL (ref 80.0–100.0)
Platelets: 164 10*3/uL (ref 150–400)
RBC: 3.66 MIL/uL — ABNORMAL LOW (ref 4.22–5.81)
RDW: 15.1 % (ref 11.5–15.5)
WBC: 4.9 10*3/uL (ref 4.0–10.5)
nRBC: 0 % (ref 0.0–0.2)

## 2019-02-05 LAB — GLUCOSE, CAPILLARY
Glucose-Capillary: 93 mg/dL (ref 70–99)
Glucose-Capillary: 104 mg/dL — ABNORMAL HIGH (ref 70–99)
Glucose-Capillary: 120 mg/dL — ABNORMAL HIGH (ref 70–99)
Glucose-Capillary: 98 mg/dL (ref 70–99)

## 2019-02-05 LAB — BASIC METABOLIC PANEL
Anion gap: 9 (ref 5–15)
BUN: 10 mg/dL (ref 8–23)
CO2: 23 mmol/L (ref 22–32)
Calcium: 8.9 mg/dL (ref 8.9–10.3)
Chloride: 106 mmol/L (ref 98–111)
Creatinine, Ser: 1.49 mg/dL — ABNORMAL HIGH (ref 0.61–1.24)
GFR calc Af Amer: 56 mL/min — ABNORMAL LOW (ref >60)
GFR calc non Af Amer: 48 mL/min — ABNORMAL LOW (ref >60)
Glucose, Bld: 100 mg/dL — ABNORMAL HIGH (ref 70–99)
Potassium: 4 mmol/L (ref 3.5–5.1)
Sodium: 138 mmol/L (ref 135–145)

## 2019-02-05 LAB — MAGNESIUM: Magnesium: 1.6 mg/dL — ABNORMAL LOW (ref 1.7–2.4)

## 2019-02-05 LAB — DIGOXIN LEVEL: Digoxin Level: 1.1 ng/mL (ref 0.8–2.0)

## 2019-02-05 MED ORDER — FUROSEMIDE 40 MG PO TABS
40.0000 mg | ORAL_TABLET | Freq: Every day | ORAL | Status: DC
  Administered 2019-02-05 – 2019-02-07 (×3): 40 mg via ORAL
  Filled 2019-02-05 (×3): qty 1

## 2019-02-05 MED ORDER — MAGNESIUM SULFATE 4 GM/100ML IV SOLN
4.0000 g | Freq: Once | INTRAVENOUS | Status: AC
  Administered 2019-02-05: 4 g via INTRAVENOUS
  Filled 2019-02-05: qty 100

## 2019-02-05 NOTE — TOC Benefit Eligibility Note (Signed)
Transition of Care [facility]) Benefit Eligibility Note    Patient Details  Name: Glenn [name] MRN: [id number] Date of Birth: January 24, 1951   Medication/Dose: [name]  150 MG Q12HRS  Covered?: Yes  Tier: (TIER- 4 DRUG)  Prescription Coverage Preferred Pharmacy: [facility]anne [facility]man with Person/Company/Phone Number:: TEMISHA  @ [employer] [place] # [phone]  Co-Pay: $3.90     Deductible: Danley Danker Phone Number: 02/05/2019, 12:03 PM

## 2019-02-05 NOTE — TOC Initial Note (Addendum)
Transition of Care [facility]) - Initial/Assessment Note    Patient Details  Name: Glenn [name] MRN: [id number] Date of Birth: [birth year]  [facility] of [facility]) CM/SW Contact:    Zenon Mayo, RN Phone Number: 02/05/2019, 12:52 PM  Clinical Narrative:                 NCM offered choice to patient for [facility], HHPT, he chose [facility], referral made to [place] with [facility], awaiting call back to make sure they can staff.  Patient also will need a rolling walker, ok with [employer] bringing to room.  Patient states he has transportation at dc.NCM informed patient of co pay amount for pradaxa.     Expected Discharge Plan: [place] Barriers to Discharge: Continued Medical Work up   Patient Goals and CMS Choice Patient states their goals for this hospitalization and ongoing recovery are:: get better CMS Medicare.gov Compare Post Acute Care list provided to:: Patient Choice offered to / list presented to : Patient  Expected Discharge Plan and Services Expected Discharge Plan: [place]   Discharge Planning Services: CM Consult Post Acute Care Choice: Home Health, Durable Medical Equipment Living arrangements for the past 2 months: Single Family Home                 DME Arranged: Walker rolling DME Agency: [employer] Date DME Agency Contacted: 02/05/19 Time DME Agency Contacted: [id number] Representative spoke with at DME Agency: zack HH Arranged: PT, RN, Disease Management [place] Agency: [facility] (formerly [employer]) Date [place]: 02/05/19 Time [place]: 1252 Representative spoke with at [place]: [place] Arrangements/Services Living arrangements for the past 2 months: [place] with:: Adult Children Patient language and need for interpreter reviewed:: Yes Do you feel safe going back to the place where you live?: Yes      Need for Family Participation in Patient Care: No  (Comment) Care giver support system in place?: No (comment)   Criminal Activity/Legal Involvement Pertinent to Current Situation/Hospitalization: No - Comment as needed  Activities of Daily Living Home Assistive Devices/Equipment: Wheelchair, Environmental consultant (specify type) ADL Screening (condition at time of admission) Patient's cognitive ability adequate to safely complete daily activities?: Yes Is the patient deaf or have difficulty hearing?: No Does the patient have difficulty seeing, even when wearing glasses/contacts?: No Does the patient have difficulty concentrating, remembering, or making decisions?: No Patient able to express need for assistance with ADLs?: Yes Does the patient have difficulty dressing or bathing?: Yes Independently performs ADLs?: No Communication: Needs assistance Is this a change from baseline?: Pre-admission baseline Dressing (OT): Needs assistance Is this a change from baseline?: Pre-admission baseline Grooming: Needs assistance Is this a change from baseline?: Pre-admission baseline Feeding: Independent Is this a change from baseline?: Pre-admission baseline Bathing: Needs assistance Is this a change from baseline?: Pre-admission baseline Toileting: Needs assistance Is this a change from baseline?: Pre-admission baseline In/Out Bed: Needs assistance Is this a change from baseline?: Pre-admission baseline Walks in Home: Needs assistance Is this a change from baseline?: Pre-admission baseline Does the patient have difficulty walking or climbing stairs?: Yes Weakness of Legs: Both Weakness of Arms/Hands: None  Permission Sought/Granted                  Emotional Assessment Appearance:: Appears stated age Attitude/Demeanor/Rapport: Engaged Affect (typically observed): Appropriate Orientation: : Oriented to Self, Oriented to Place, Oriented to  Time, Oriented  to Situation      Admission diagnosis:  Cholecystitis [K81.9] RUQ pain [R10.11] Atrial  fibrillation with RVR ([name]) [I48.91] Acute on chronic congestive heart failure, unspecified heart failure type [facility]) [I50.9] Patient Active Problem List   Diagnosis Date Noted  . History of hemorrhagic stroke with residual hemiparesis ([place]) 02/04/2019  . Left leg DVT ([place]) 02/04/2019  . Cholecystitis 02/04/2019  . Acute deep vein thrombosis (DVT) of both lower extremities ([name])   . Rapid atrial fibrillation ([place]) 12/02/2018  . Acute blood loss anemia   . Labile blood glucose   . AKI (acute kidney injury) ([place])   . Chronic right hip pain   . Acute renal failure superimposed on stage 3a chronic kidney disease ([place])   . Headache due to intracranial disease 11/14/2018  . Trochanteric bursitis, right hip 11/14/2018  . Cerebral edema ([place]) 11/13/2018  . Hyperlipidemia LDL goal <70 11/13/2018  . Marijuana user 11/13/2018  . Right middle cerebral artery stroke ([place]) 11/13/2018  . Acute CVA (cerebrovascular accident) ([place])   . Noncompliance   . Dysphagia, post-stroke   . Acute on chronic combined systolic and diastolic CHF (congestive heart failure) ([place])   . Wide-complex tachycardia ([place])   . DCM (dilated cardiomyopathy) ([place])   . Atrial fibrillation ([place]) 11/04/2018  . Acute on chronic systolic heart failure ([place])   . Elevated troponin   . CHF exacerbation ([place]) 10/30/2018  . Acute respiratory failure with hypoxia ([place])   . Acute kidney injury ([place])   . Entrapment of right ulnar nerve 02/28/2018  . Carpal tunnel syndrome of right wrist 02/28/2018  . Impotence due to erectile dysfunction 09/30/2017  . Solitary pulmonary nodule 06/10/2017  . Neck pain 04/06/2017  . Paroxysmal atrial fibrillation ([name])   . Visit for monitoring Tikosyn therapy 03/25/2017  . ICD (implantable cardioverter-defibrillator) in place 09/13/2016  . Chest pain 09/13/2016  . Tobacco abuse 09/13/2016  . Hospital discharge follow-up 09/13/2016  . Upper back pain 09/13/2016  . Housing problems 09/13/2016  .  Persistent atrial fibrillation ([place])   . Atrial fibrillation with RVR ([place])   . Ischemic cardiomyopathy   . Cerebral infarction ([place])   . Stroke (cerebrum) ([name]) Lg L MCA infarct w/ hemorrhagic conversion, embolic d/t AF [id number]  . Acute on chronic systolic CHF (congestive heart failure) ([place]) 08/14/2016  . HTN (hypertension) 08/14/2016  . Chronic Hepatitis C  08/14/2016   PCP:  [name], MD Pharmacy:   [facility] [place] [place] [address] Phone: [phone] Fax: [phone]     Social Determinants of Health (SDOH) Interventions    Readmission Risk Interventions Readmission Risk Prevention Plan 02/05/2019  Transportation Screening Complete  Medication Review ([place]) Complete  PCP or Specialist appointment within 3-5 days of discharge Complete  HRI or [place] Complete  SW Recovery Care/Counseling Consult Complete  [place] Not Applicable

## 2019-02-05 NOTE — Telephone Encounter (Signed)
Pt in hospital. Canceled his 8:15 appt for today (02/05/19).   If Pt calls back please r/s his appt w/ NP Frann Rider.

## 2019-02-05 NOTE — Progress Notes (Deleted)
[facility] [address] [place]. [place] [address] [phone]       OFFICE FOLLOW UP NOTE  [name] Date of Birth:  April 09, 1950 Medical Record Number:  [id number]   Reason for Referral:  hospital stroke follow up  CHIEF COMPLAINT:  No chief complaint on file.   HPI:  Stroke admission 11/11/2018:  Mr.Glenn F Dejournetteis a 68 y.o.malewith history of AF on Eliquis, CHF, hepatitis C, HTN, CM w/ low EF, ICD placement and prior stroke just discharged from hospital 4 days prior for AF w/ RVR s/p cardioversion, volume overload and AKI, now presenting with L sided weakness and neglect.  Evaluated by Dr. Erlinda Hong and stroke team with stroke work-up revealing large right MCA infarct with hemorrhagic conversion as evidenced on CT and MRI secondary to known AF on Eliquis status post reversal with Kcentra.  CTA head/neck showed subtotal occlusion right M1 consistent with thrombus with hemorrhagic infarct right MCA, right ICA 35 to 40% at origin, mild left ICA arthrosclerosis and mild to moderate BA stenosis and bilateral PCAs.  Repeat CT head no significant change with evidence of hemorrhage in right caudate head and to right basal ganglia, small right lateral ventricle hemorrhage stable, mass-effect with partial effacement right lateral frontal horn, right to left subfalcine herniation and 2 mm midline shift similar.  2D echo obtained on prior admission 10/30/2018 with EF 15 to 20% and A. fib and AV regurgitation.  It was recommended to repeat CT head in 2 weeks and resume Eliquis regimen at hemorrhagic conversion resolves.  History of acute on chronic CHF with nonischemic cardiomyopathy EF of 15 to 20% status post ICD previously on Entresto, Tikosyn, spironolactone, Lasix and Coreg and recommended holding Tikosyn with potentially restarting in 3 weeks in order to establish NSR and follow-up with outpatient cardiology.  History of prior stroke 08/2016 (as described below) left  MCA status post IR for left M2 occlusion discharged on Eliquis and aspirin.  HTN stable with continuation of Coreg, Lasix, spironolactone and Entresto.  LDL 96 and recommended initiation of atorvastatin 20 mg daily.  Current tobacco use with smoking cessation counseling provided.  Other stroke risk factors include advanced age, EtOH use, substance abuse with daily marijuana use and history of cocaine use.  Other active problems include chronic hep C status post treatment and AKI.  Residual deficits of moderate dysarthria, left sensory neglect, dysphagia, left facial droop, and left hemiparesis and discharged to CIR for ongoing therapy.     Stroke admission 09/05/2016: Glenn Dejournetteis an 68 y.o.malewho presents to the ED as a Code Stroke. He was last seen normal at 7:30 PM, then became suddenly mute with right upper extremity weakness and right facial droop. Deficits were noted by his son, who immediately called 911. At his baseline he is alert, oriented and fully independent.  On arrival to the ED he remained mute with right hemiparesis and prominent right facial droop, in addition to leftward gaze deviation and relative neglect of his right side. CT head reviewed and negative for hemorrhage or acute hypodensity. Hyperdense left M2 segment compatible with acute thrombosis was noted. CTA of head revealed a left MCA superior division origin occlusion with intermediate collateralization. CTA neck revealed patent carotid and vertebral arteries. No dissection, aneurysm, or hemodynamically significant stenosis is identified. CT perfusion study showed no infarct core (CBF less than 30%) by automated RAPID processing. At risk ischemia in the left superior MCA distribution with volume 108 cc was noted.  Patient underwent  mechanical thrombectomy for left M2 occlusion and tolerated this well without complication.  After procedure, he had no focal findings on exam with a NIH stroke scale of 0.  Repeat CT scan  reviewed and showed no acute abnormalities.  2D echo showed an EF of 30-35%.  Unable to obtain MRI/MRA due to implanted defibrillator.  LDL 44 and A1c 4.7.  PT/OT/SLP did not recommend therapies as they are not needed.  PTA, patient taking Xarelto due to atrial fibrillation history and aspirin 81 mg for cardiac history daily but as patient was compliant with this medication, it was recommended to switch to Eliquis and continue aspirin 81 mg.  Patient discharged home in stable condition.  Patient was seen in this office on 11/13/16 by Dr. Leonie Man. It was recommended at that time to continue Eliquis and aspirin.   05/31/17 visit: Patient returns today for a 6 month follow up.  Patient has been doing well and continues to take his Eliquis without bleeding or bruising.  He no longer takes aspirin as his cardiologist DC'd this medication.  Continues to take Tikosyn without side effects.  Blood pressure at today's visit 151/72 which patient knows this is elevated but was unable to take all his medications prior to coming to appointment.  He continues to stay active and eat a healthy diet.  No further concerns or new or worsening stroke/TIA symptoms.  Interval history 12/02/2017: Patient is being seen today for scheduled follow-up visit.  Overall he is been doing well from a stroke standpoint without residual deficits or recurring of symptoms.  He continues to take Eliquis without side effects of bleeding or bruising and continues to follow with cardiology for management.  He is not on any statin as during hospital admission lipid panel satisfactory but per epic notes, does not appear his lipid panel has been repeated since 08/2016.  Blood pressure today satisfactory 115/67.  Patient unfortunately sustained a left wrist fracture in 07/2017 and was placed in a cast.  He was [facility] on 09/23/2017 for left wrist pain.  Per notes, the day prior to cast got wet from the rain and he reviewed this with a razor blade.  X-ray  showed healing fracture of the distal radius and it was recommended for him to follow-up with hand specialist.  He does continue to endorse mild pain and decreased range of motion but overall has been improving.  No further concerns at this time.  Denies new or worsening stroke/TIA symptoms.   ROS:   14 system review of systems performed and negative with exception of no complaints  PMH:  Past Medical History:  Diagnosis Date  . Atrial fibrillation ([place])   . CHF (congestive heart failure) ([place])   . Hepatitis C   . Hypertension   . Stroke ([place])   . Visit for monitoring Tikosyn therapy 03/26/2017    PSH:  Past Surgical History:  Procedure Laterality Date  . CARDIAC DEFIBRILLATOR PLACEMENT  2015  . CARDIOVERSION N/A 10/10/2016   Procedure: CARDIOVERSION;  Surgeon: Dorothy Spark, MD;  Location: [facility];  Service: Cardiovascular;  Laterality: N/A;  . CARDIOVERSION N/A 03/27/2017   Procedure: CARDIOVERSION;  Surgeon: Jerline Pain, MD;  Location: [facility] ENDOSCOPY;  Service: Cardiovascular;  Laterality: N/A;  . CARDIOVERSION N/A 10/29/2018   Procedure: CARDIOVERSION;  Surgeon: Sanda Klein, MD;  Location: [facility] ENDOSCOPY;  Service: Cardiovascular;  Laterality: N/A;  . CARDIOVERSION N/A 11/05/2018   Procedure: CARDIOVERSION;  Surgeon: Thayer Headings, MD;  Location:  [facility] ENDOSCOPY;  Service: Cardiovascular;  Laterality: N/A;  . EYE SURGERY Left 1990  . IR IVC FILTER PLMT / S&I /IMG GUID/MOD SED  12/04/2018  . IR PERC CHOLECYSTOSTOMY  12/13/2018  . IR PERCUTANEOUS ART THROMBECTOMY/INFUSION INTRACRANIAL INC DIAG ANGIO  09/05/2016  . IR RADIOLOGIST EVAL & MGMT  10/03/2016  . RADIOLOGY WITH ANESTHESIA N/A 09/05/2016   Procedure: RADIOLOGY WITH ANESTHESIA;  Surgeon: Luanne Bras, MD;  Location: [facility];  Service: Radiology;  Laterality: N/A;  . RIGHT/LEFT HEART CATH AND CORONARY ANGIOGRAPHY N/A 11/03/2018   Procedure: RIGHT/LEFT HEART CATH AND CORONARY ANGIOGRAPHY;  Surgeon: Lorretta Harp, MD;  Location: [facility];  Service: Cardiovascular;  Laterality: N/A;    Social History:  Social History   Socioeconomic History  . Marital status: Single    Spouse name: Not on file  . Number of children: Not on file  . Years of education: 30 (some college)  . Highest education level: Not on file  Occupational History  . Occupation: disability  Tobacco Use  . Smoking status: Current Some Day Smoker    Packs/day: 0.50    Types: Cigarettes  . Smokeless tobacco: Never Used  . Tobacco comment: a pack last three days  Substance and Sexual Activity  . Alcohol use: Not Currently    Alcohol/week: 3.0 standard drinks    Types: 3 Cans of beer per week    Comment: pt stop drinking   . Drug use: Yes    Frequency: 2.0 times per week    Types: Marijuana    Comment: stop smoking   . Sexual activity: Yes    Partners: Female    Birth control/protection: Condom  Other Topics Concern  . Not on file  Social History Narrative  . Not on file   Social Determinants of Health   Financial Resource Strain:   . Difficulty of Paying Living Expenses: Not on file  Food Insecurity:   . Worried About Charity fundraiser in the Last Year: Not on file  . Ran Out of Food in the Last Year: Not on file  Transportation Needs:   . Lack of Transportation (Medical): Not on file  . Lack of Transportation (Non-Medical): Not on file  Physical Activity:   . Days of Exercise per Week: Not on file  . Minutes of Exercise per Session: Not on file  Stress:   . Feeling of Stress : Not on file  Social Connections:   . Frequency of Communication with Friends and Family: Not on file  . Frequency of Social Gatherings with Friends and Family: Not on file  . Attends Religious Services: Not on file  . Active Member of Clubs or Organizations: Not on file  . Attends Archivist Meetings: Not on file  . Marital Status: Not on file  Intimate Partner Violence:   . Fear of Current or Ex-Partner: Not  on file  . Emotionally Abused: Not on file  . Physically Abused: Not on file  . Sexually Abused: Not on file    Family History:  Family History  Problem Relation Age of Onset  . High blood pressure Mother   . High blood pressure Father   . Stroke Maternal Aunt   . Heart disease Neg Hx     Medications:   Current Facility-Administered Medications on File Prior to Visit  Medication Dose Route Frequency Provider Last Rate Last Admin  . 0.9 %  sodium chloride infusion  250 mL Intravenous PRN  Vianne Bulls, MD      . acetaminophen (TYLENOL) tablet 650 mg  650 mg Oral Q4H PRN Opyd, Ilene Qua, MD      . amiodarone (NEXTERONE PREMIX) 360-4.14 MG/200ML-% (1.8 mg/mL) IV infusion  30 mg/hr Intravenous Continuous Opyd, Ilene Qua, MD 16.67 mL/hr at 02/05/19 0633 30 mg/hr at 02/05/19 0633  . atorvastatin (LIPITOR) tablet 20 mg  20 mg Oral q1800 Opyd, Ilene Qua, MD      . calcium carbonate (TUMS - dosed in mg elemental calcium) chewable tablet 200 mg of elemental calcium  1 tablet Oral Daily PRN Opyd, Ilene Qua, MD      . dabigatran ([name]) capsule 150 mg  150 mg Oral Q12H Opyd, Ilene Qua, MD   150 mg at 02/04/19 2242  . digoxin (LANOXIN) 0.25 MG/ML injection 0.25 mg  0.25 mg Intravenous Once Cecilie Kicks R, NP      . digoxin (LANOXIN) tablet 0.25 mg  0.25 mg Oral Daily Cecilie Kicks R, NP      . gabapentin (NEURONTIN) capsule 300 mg  300 mg Oral Q8H Opyd, Ilene Qua, MD   300 mg at 02/05/19 0541  . HYDROmorphone (DILAUDID) injection 0.5 mg  0.5 mg Intravenous Q4H PRN Opyd, Ilene Qua, MD   0.5 mg at 02/04/19 0230  . influenza vaccine adjuvanted (FLUAD) injection 0.5 mL  0.5 mL Intramuscular Tomorrow-1000 Hongalgi, Anand D, MD      . ipratropium-albuterol (DUONEB) 0.5-2.5 (3) MG/3ML nebulizer solution 3 mL  3 mL Nebulization Q6H PRN Hongalgi, Anand D, MD      . magnesium sulfate IVPB 4 g 100 mL  4 g Intravenous Once [name], PA-C      . metoprolol succinate (TOPROL-XL) 24 hr tablet  100 mg  100 mg Oral Daily Opyd, Ilene Qua, MD   100 mg at 02/04/19 1009  . ondansetron (ZOFRAN) injection 4 mg  4 mg Intravenous Q6H PRN Opyd, Ilene Qua, MD   4 mg at 02/04/19 2237  . pantoprazole (PROTONIX) EC tablet 40 mg  40 mg Oral Daily Opyd, Ilene Qua, MD   40 mg at 02/04/19 1009  . potassium chloride SA (KLOR-CON) CR tablet 20 mEq  20 mEq Oral Daily Opyd, Ilene Qua, MD   20 mEq at 02/04/19 1009  . sodium chloride flush (NS) 0.9 % injection 3 mL  3 mL Intravenous Q12H Opyd, Ilene Qua, MD   3 mL at 02/04/19 0236  . sodium chloride flush (NS) 0.9 % injection 3 mL  3 mL Intravenous PRN Opyd, Ilene Qua, MD      . traMADol (ULTRAM) tablet 50 mg  50 mg Oral Q8H PRN Opyd, Ilene Qua, MD   50 mg at 02/04/19 1515   Current Outpatient Medications on File Prior to Visit  Medication Sig Dispense Refill  . acetaminophen (TYLENOL) 325 MG tablet Take 2 tablets (650 mg total) by mouth every 4 (four) hours as needed for mild pain (or temp > 37.5 C (99.5 F)).    Marland Kitchen atorvastatin (LIPITOR) 20 MG tablet Take 1 tablet (20 mg total) by mouth daily at 6 PM.    . calcium carbonate (TUMS - DOSED IN MG ELEMENTAL CALCIUM) 500 MG chewable tablet Chew 1 tablet (200 mg of elemental calcium total) by mouth daily as needed for indigestion or heartburn. 30 tablet 1  . dabigatran ([name]) 150 MG CAPS capsule Take 1 capsule (150 mg total) by mouth every 12 (twelve) hours. 60 capsule 2  . feeding supplement,  ENSURE ENLIVE, (ENSURE ENLIVE) LIQD Take 237 mLs by mouth 3 (three) times daily between meals. (Patient not taking: Reported on 02/04/2019) 237 mL 12  . furosemide (LASIX) 40 MG tablet Take 1 tablet (40 mg total) by mouth daily. 30 tablet 0  . gabapentin (NEURONTIN) 300 MG capsule Take 1 capsule (300 mg total) by mouth every 8 (eight) hours. 90 capsule 0  . ipratropium (ATROVENT) 0.02 % nebulizer solution Take 2.5 mLs (0.5 mg total) by nebulization every 6 (six) hours as needed for wheezing or shortness of breath. 75 mL 12  .  levalbuterol (XOPENEX) 0.63 MG/3ML nebulizer solution Take 3 mLs (0.63 mg total) by nebulization every 6 (six) hours as needed for wheezing or shortness of breath. 3 mL 12  . lidocaine (LIDODERM) 5 % Place 1 patch onto the skin daily. Remove & Discard patch within 12 hours or as directed by MD (Patient not taking: Reported on 02/04/2019) 30 patch 0  . metoprolol succinate (TOPROL-XL) 100 MG 24 hr tablet Take 1 tablet (100 mg total) by mouth daily. Take with or immediately following a meal.    . pantoprazole (PROTONIX) 40 MG tablet Take 1 tablet (40 mg total) by mouth daily. 30 tablet 0  . potassium chloride SA (K-DUR) 20 MEQ tablet Take 1 tablet (20 mEq total) by mouth daily. 30 tablet 0  . spironolactone (ALDACTONE) 25 MG tablet Take 1 tablet (25 mg total) by mouth daily. 30 tablet 0  . traMADol (ULTRAM) 50 MG tablet Take 1 tablet (50 mg total) by mouth every 8 (eight) hours as needed for severe pain. 30 tablet     Allergies:   Allergies  Allergen Reactions  . Benadryl [Diphenhydramine] Palpitations     Physical Exam  There were no vitals filed for this visit. There is no height or weight on file to calculate BMI. No exam data present  General: well developed, pleasant African-American middle-aged male, well nourished, seated, in no evident distress Head: head normocephalic and atraumatic.   Neck: supple with no carotid or supraclavicular bruits Cardiovascular: regular rate and rhythm, no murmurs Musculoskeletal: no deformity; decreased ROM left wrist Skin:  no rash/petichiae Vascular:  Normal pulses all extremities  Neurologic Exam Mental Status: Awake and fully alert. Oriented to place and time. Recent and remote memory intact. Attention span, concentration and fund of knowledge appropriate. Mood and affect appropriate.  Cranial Nerves: Fundoscopic exam deferred. Pupils equal, briskly reactive to light. Extraocular movements full without nystagmus. Visual fields full to  confrontation. Hearing intact. Facial sensation intact. Face, tongue, palate moves normally and symmetrically.  Motor: Normal bulk and tone. Normal strength in all tested extremity muscles. Sensory.: intact to touch , pinprick , position and vibratory sensation.  Coordination: Rapid alternating movements normal in all extremities. Finger-to-nose and heel-to-shin performed accurately bilaterally. Gait and Station: Arises from chair without difficulty. Stance is normal. Gait demonstrates normal stride length and balance . Able to heel, toe and tandem walk without difficulty.  Reflexes: 1+ and symmetric. Toes downgoing.      Diagnostic Data (Labs, Imaging, Testing)   Ct Angio Head W Or Wo Contrast Ct Angio Neck W And/or Wo Contrast 09/05/2016 CTA head:  1. Left MCA superior division origin occlusion with intermediate collateralization.  2. Otherwise patent circle of Willis without significant stenosis or aneurysm identified.   CTA neck:  1. Patent carotid and vertebral arteries. No dissection, aneurysm, or hemodynamically significant stenosis is identified.  2. 5 mm right upper lobe pulmonary nodule.  Ct  Head Wo Contrast 09/06/2016 No hemorrhage or other acute abnormality following neurovascular intervention. Gray-white differentiation is preserved in the region of ischemia demonstrated on the earlier perfusion study.  Ct Cerebral Perfusion W Contrast 09/05/2016 CT perfusion: No infarct core (CBF less than 30%) by automated RAPID processing. At risk ischemia in the left superior MCA distribution with volume 108 cc. CTA head:    Ct Head Code Stroke W/o Cm 09/05/2016  1. Negative for acute infarct or hemorrhage. Hyperdense left M2 segment compatible with acute thrombosis.  2. ASPECTS is 10   DSA - complete revascularization of occluded Lt MCA superior division with x 1 pass with solitaire 15mm x 40 mm retriever device achieving a TICI 3 reperfusion.  CUS- Findings are  consistent with a 1-39 percent stenosis involving the right internal carotid artery and the left internal carotid artery. The vertebral arteries demonstrate antegrade flow.  TTE- Left ventricle: The cavity size was normal. Systolic function was moderately to severely reduced. The estimated ejection fraction was in the range of 30% to 35%.Diffuse hypokinesis. Moderate hypokinesis of the anteroseptal, inferior, and inferoseptal myocardium. - Aortic valve: Transvalvular velocity was within the normal range. There was no stenosis. There was moderate regurgitation. Valve area (VTI): 2.25 cm^2. Valve area (Vmax): 2.37 cm^2. Valve area (Vmean): 2.34 cm^2. - Mitral valve: Transvalvular velocity was within the normal range. There was no evidence for stenosis. There was mild regurgitation. - Left atrium: The atrium was moderately dilated. - Right ventricle: The cavity size was normal. Wall thickness was normal. Systolic function was mildly reduced. - Right atrium: The atrium was moderately dilated. - Tricuspid valve: There was mild-moderate regurgitation. - Pulmonary arteries: Systolic pressure was mildly increased. PA peak pressure: 38 mm Hg (S).    ASSESSMENT: Glenn [name] is a 68 y.o. year old male here with large right MCA infarct with hemorrhagic conversion secondary to known AF on Eliquis status post reversal with Kcentra on 11/10/2018.  History of left MCA stroke on 09/05/16 secondary to atrial fibrillation and low EF although on Xarelto and ASA. Patient underwent mechanical thrombectomy with excellent clinical recovery. Vascular risk factors include atrial fibrillation, CHF, nonischemic cardiomyopathy with a EF 15 to 20% status post ICD, HLD, prior stroke, substance abuse, tobacco abuse and HTN.     PLAN: -Continue Eliquis (apixaban) daily for secondary stroke prevention and atrial fibrillation -f/u with cardiologist for atrial fibrillation and Eliquis management  along with other chronic cardiac conditions -Advised to follow-up with PCP with possible need of orthopedic services for continued left wrist pain if needed -F/u with PCP regarding your HTN management -request lipid panel be obtained at follow-up appointment to ensure continued satisfactory cholesterol levels -continue to eat healthy and remain active -Maintain strict control of hypertension with blood pressure goal below 130/90, diabetes with hemoglobin A1c goal below 6.5% and cholesterol with LDL cholesterol (bad cholesterol) goal below 70 mg/dL. I also advised the patient to eat a healthy diet with plenty of whole grains, cereals, fruits and vegetables, exercise regularly and maintain ideal body weight.  Follow up as needed as stable from stroke standpoint and advised to call with questions, concerns or need of future follow-up appointment.   Greater than 50% time during this 25 minute consultation visit was spent on counseling and coordination of care about a fib, and HTN, discussion about risk benefit of anticoagulation and answering questions.    Venancio Poisson, AGNP-BC  [facility] [address] [place], [place] [address]  Phone [phone]  Fax [phone]

## 2019-02-05 NOTE — Evaluation (Addendum)
Physical Therapy Evaluation Patient Details Name: Glenn [name] MRN: [id number] DOB: [birth year] Today's Date: 02/05/2019   History of Present Illness  68 y.o. male with medical history significant for chronic systolic CHF with EF 123456, history of hemorrhagic CVA with left-sided hemiparesis, left leg DVT with IVC filter, atrial fibrillation started back on [name] during recent hospitalization, and cholecystitis with percutaneous cholecystostomy tube in place, now presenting to the emergency department for evaluation of shortness of breath and increased leg swelling.  Clinical Impression  Pt demonstrates deficits in gait, balance, functional mobility, endurance, and with pain in both feet. Pt is limited by foot pain and noted welling LLE>RLE. Pt is able to perform bed mobility, transfer, and ambulate with use of cane and no need for physical assistance. Pt will benefit from continued acute PT POC to improve activity tolerance, reduce gait deviations, and assess dynamic balance. Pt will also benefit from assessment of Rw vs Rollator at next session to improve safety and tolerance for community mobility.    Follow Up Recommendations Home health PT;Supervision - Intermittent    Equipment Recommendations  Rolling walker with 5" wheels    Recommendations for Other Services       Precautions / Restrictions Precautions Precautions: Fall Restrictions Weight Bearing Restrictions: No      Mobility  Bed Mobility Overal bed mobility: Needs Assistance Bed Mobility: Supine to Sit     Supine to sit: Supervision        Transfers Overall transfer level: Needs assistance Equipment used: Straight cane Transfers: Sit to/from Stand Sit to Stand: Supervision;From elevated surface            Ambulation/Gait Ambulation/Gait assistance: Supervision Gait Distance (Feet): 40 Feet Assistive device: Straight cane Gait Pattern/deviations: Step-to pattern Gait velocity: reduced Gait  velocity interpretation: <1.8 ft/sec, indicate of risk for recurrent falls General Gait Details: pt with shortened step to gait with slight increase in lateral sway.  Stairs            Wheelchair Mobility    Modified Rankin (Stroke Patients Only)       Balance Overall balance assessment: Needs assistance Sitting-balance support: No upper extremity supported;Feet supported Sitting balance-Leahy Scale: Good Sitting balance - Comments: supervision   Standing balance support: Single extremity supported Standing balance-Leahy Scale: Good Standing balance comment: supervision with RUE support of cane                             Pertinent Vitals/Pain Pain Assessment: 0-10 Pain Score: 6  Pain Location: feet Pain Descriptors / Indicators: Sharp Pain Intervention(s): Limited activity within patient's tolerance    Home Living Family/patient expects to be discharged to:: Private residence Living Arrangements: Children Available Help at Discharge: Family;Available PRN/intermittently Type of Home: Apartment Home Access: Stairs to enter Entrance Stairs-Rails: None Entrance Stairs-Number of Steps: 1 Home Layout: One level Home Equipment: Cane - single point      Prior Function Level of Independence: Independent with assistive device(s)(cane for limited community distances)         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[employer], 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     Hand Dominance   Dominant Hand: Right    Extremity/Trunk Assessment   Upper Extremity Assessment Upper Extremity Assessment: LUE deficits/detail LUE Deficits / Details: ROM WFL, grossly weak 4-/5    Lower Extremity Assessment Lower Extremity Assessment: LLE deficits/detail LLE Deficits / Details: Grossly 4-/5, 5 degrees terminal knee extension limited actively  LLE Sensation: WNL    Cervical / Trunk Assessment Cervical / Trunk Assessment: Normal  Communication   Communication: No difficulties   Cognition Arousal/Alertness: Awake/alert Behavior During Therapy: WFL for tasks assessed/performed Overall Cognitive Status: Within Functional Limits for tasks assessed                                        General Comments General comments (skin integrity, edema, etc.): Pt reports mild SOB, but is more limited by use of cane, no significant increase in work of breathing noted    Exercises     Assessment/Plan    PT Assessment Patient needs continued PT services  PT Problem List Decreased strength;Decreased activity tolerance;Decreased balance;Decreased mobility;Decreased range of motion;Cardiopulmonary status limiting activity;Pain       PT Treatment Interventions DME instruction;Gait training;Stair training;Functional mobility training;Therapeutic activities;Therapeutic exercise;Balance training;Neuromuscular re-education;Patient/family education    PT Goals (Current goals can be found in the Care Plan section)  Acute Rehab PT Goals Patient Stated Goal: To return to independent mobility PT Goal Formulation: With patient Time For Goal Achievement: 02/19/19 Potential to Achieve Goals: Good    Frequency Min 3X/week   Barriers to discharge        Co-evaluation               AM-PAC PT "6 Clicks" Mobility  Outcome Measure Help needed turning from your back to your side while in a flat bed without using bedrails?: None Help needed moving from lying on your back to sitting on the side of a flat bed without using bedrails?: None Help needed moving to and from a bed to a chair (including a wheelchair)?: None Help needed standing up from a chair using your arms (e.g., wheelchair or bedside chair)?: None Help needed to walk in hospital room?: None Help needed climbing 3-5 steps with a railing? : A Little 6 Click Score: 23    End of Session   Activity Tolerance: Patient tolerated treatment well Patient left: in chair;with call bell/phone within  reach Nurse Communication: Mobility status PT Visit Diagnosis: Other abnormalities of gait and mobility (R26.89)    Time: [id number] PT Time Calculation (min) (ACUTE ONLY): 28 min   Charges:   PT Evaluation $PT Eval Moderate Complexity: [place], PT, DPT Acute Rehabilitation Pager: [phone]   Zenaida Niece 02/05/2019, 11:13 AM

## 2019-02-05 NOTE — Progress Notes (Addendum)
Electrophysiology Rounding Note  Patient Name: Glenn [name] Date of Encounter: 02/05/2019  Primary Cardiologist: Virl Axe, MD Electrophysiologist: None   Subjective   The patient is doing well today.  At this time, the patient denies chest pain, shortness of breath, or any new concerns.  Inpatient Medications    Scheduled Meds:  atorvastatin  20 mg Oral q1800   dabigatran  150 mg Oral Q12H   digoxin  0.25 mg Intravenous Once   digoxin  0.25 mg Oral Daily   gabapentin  300 mg Oral Q8H   influenza vaccine adjuvanted  0.5 mL Intramuscular Tomorrow-1000   metoprolol succinate  100 mg Oral Daily   pantoprazole  40 mg Oral Daily   potassium chloride SA  20 mEq Oral Daily   sodium chloride flush  3 mL Intravenous Q12H   Continuous Infusions:  sodium chloride     amiodarone 30 mg/hr (02/05/19 [id number])   magnesium sulfate bolus IVPB     PRN Meds: sodium chloride, acetaminophen, calcium carbonate, HYDROmorphone (DILAUDID) injection, ipratropium-albuterol, ondansetron (ZOFRAN) IV, sodium chloride flush, traMADol   Vital Signs    Vitals:   02/05/19 0052 02/05/19 0123 02/05/19 0601 02/05/19 0811  BP: 127/85  120/69 115/70  Pulse: 73  89 69  Resp: 20  19 20   Temp: 98.2 F (36.8 C)  98.1 F (36.7 C) 98.1 F (36.7 C)  TempSrc: Oral  Oral Oral  SpO2: 94%  94% 97%  Weight:  78.1 kg    Height:        Intake/Output Summary (Last 24 hours) at 02/05/2019 0835 Last data filed at 02/05/2019 0300 Gross per 24 hour  Intake 852.9 ml  Output 600 ml  Net 252.9 ml   Filed Weights   02/04/19 1430 02/05/19 0123  Weight: 78 kg 78.1 kg    Physical Exam    GEN- The patient is well appearing, alert and oriented x 3 today.   Head- normocephalic, atraumatic Eyes-  Sclera clear, conjunctiva pink Ears- hearing intact Oropharynx- clear Neck- supple Lungs- Clear to ausculation bilaterally, normal work of breathing Heart- Regular rate and rhythm, no murmurs, rubs or  gallops GI- soft, NT, ND, + BS Extremities- no clubbing, cyanosis, or edema Skin- no rash or lesion Psych- euthymic mood, full affect Neuro- strength and sensation are intact  Labs    CBC Recent Labs    02/03/19 1902 02/05/19 0451  WBC 4.7 4.9  HGB 10.9* 11.0*  HCT 33.0* 34.1*  MCV 92.2 93.2  PLT 173 123456   Basic Metabolic Panel Recent Labs    02/03/19 1902 02/04/19 1312 02/05/19 0451  NA 140  --  138  K 3.7  --  4.0  CL 108  --  106  CO2 20*  --  23  GLUCOSE 113*  --  100*  BUN 10  --  10  CREATININE 1.09  --  1.49*  CALCIUM 9.5  --  8.9  MG  --  1.8 1.6*   Liver Function Tests No results for input(s): AST, ALT, ALKPHOS, BILITOT, PROT, ALBUMIN in the last 72 hours. No results for input(s): LIPASE, AMYLASE in the last 72 hours. Cardiac Enzymes No results for input(s): CKTOTAL, CKMB, CKMBINDEX, TROPONINI in the last 72 hours.   Telemetry    AF 70-90s (personally reviewed)  Radiology    DG Chest 2 View  Result Date: 02/03/2019 CLINICAL DATA:  Shortness of breath EXAM: CHEST - 2 VIEW COMPARISON:  12/09/2018 FINDINGS: Left AICD. Cardiomegaly with  vascular congestion. Airspace disease noted in the lower lobes bilaterally, right greater than left. This is slightly increased at the left base, similar on the right since prior study. Suspect small right pleural effusion. No acute bony abnormality. IMPRESSION: Persistent right basilar infiltrate. Increasing left basilar atelectasis or infiltrate. Cardiomegaly. Electronically Signed   By: Rolm Baptise M.D.   On: 02/03/2019 19:26   US Abdomen Limited RUQ  Result Date: 02/04/2019 CLINICAL DATA:  Right upper quadrant pain. Cholecystitis. Cholecystostomy tube. EXAM: ULTRASOUND ABDOMEN LIMITED RIGHT UPPER QUADRANT COMPARISON:  Ultrasound 12/20/2018. FINDINGS: Gallbladder: Gallbladder is decompressed. Multiple shadowing gallstones. Cholecystostomy tube is visualized extending into the decompressed gallbladder. Gallbladder wall  thickening of 4 mm. Small amount of pericholecystic fluid. Positive sonographic Murphy sign noted by sonographer. Common bile duct: Diameter: 5 mm. Liver: No focal lesion identified. Within normal limits in parenchymal echogenicity. Portal vein is patent on color Doppler imaging with normal direction of blood flow towards the liver. Other: None. IMPRESSION: 1. Cholecystostomy tube decompressing the gallbladder. Multiple gallstones with gallbladder wall thickening and small amount pericholecystic fluid. There is a positive sonographic Murphy sign. Other than trace pericholecystic fluid, sonographic findings are unchanged from 12/20/2018. 2. No biliary dilatation. Electronically Signed   By: Ardon Rake M.D.   On: 02/04/2019 03:04    Patient Profile     Glenn [name] is a 68 y.o. male with a history of ICD for NICM, Persistent AF previously on tikosyn, CVA, HTN, HF, and Hepatitis C who is being seen today for the evaluation of AF with RVR at the request of Dr. Johnsie [name].  Assessment & Plan    1.  Persistent Atrial fibrillation with RVR Remains in AF. Double checked with Endo, no TEE/DCCV spot for today. Left on schedule for tomorrow at 0815. Continue pradaza for CHA2DS2VASC of at least 5    Continue IV amiodarone. Post DCCV can transition to 400 mg daily and continue until follow up.    2. Acute on chronic systolic CHF s/p [employer] Sci single chamber ICD 2015 Likely in the setting of AF with RVR Echo 12/07/2018 showed LVEF 20% Creatinine up someone on IV lasix. Resume po lasix and follow.    3. H/o recent hemorrhagic stroke 10/2018 Follow closely on pradaxa.    4. Hypomagnesemia 1.6 this am. Supplementation ordered.   For questions or updates, please contact [place] Please consult [email or web address] for contact info under Cardiology/STEMI.  Signed, [name], PA-C  02/05/2019, 8:35 AM   I have seen and examined this patient with Glenn [name].  Agree with above, note  added to reflect my findings.  On exam, RRR, no murmurs, lungs clear.  Patient remains in atrial fibrillation today.  We Glenn [name] plan to continue amiodarone and [name].  Plan for TEE and cardioversion tomorrow.  Glenn [name] M. Tambria Pfannenstiel MD 02/05/2019 10:46 AM

## 2019-02-05 NOTE — H&P (View-Only) (Signed)
Electrophysiology Rounding Note  Patient Name: Glenn [name] Date of Encounter: 02/05/2019  Primary Cardiologist: Virl Axe, [name] Electrophysiologist: None   Subjective   The patient is doing well today.  At this time, the patient denies chest pain, shortness of breath, or any new concerns.  Inpatient Medications    Scheduled Meds:  atorvastatin  20 mg Oral q1800   dabigatran  150 mg Oral Q12H   digoxin  0.25 mg Intravenous Once   digoxin  0.25 mg Oral Daily   gabapentin  300 mg Oral Q8H   influenza vaccine adjuvanted  0.5 mL Intramuscular Tomorrow-1000   metoprolol succinate  100 mg Oral Daily   pantoprazole  40 mg Oral Daily   potassium chloride SA  20 mEq Oral Daily   sodium chloride flush  3 mL Intravenous Q12H   Continuous Infusions:  sodium chloride     amiodarone 30 mg/hr (02/05/19 [id number])   magnesium sulfate bolus IVPB     PRN Meds: sodium chloride, acetaminophen, calcium carbonate, HYDROmorphone (DILAUDID) injection, ipratropium-albuterol, ondansetron (ZOFRAN) IV, sodium chloride flush, traMADol   Vital Signs    Vitals:   02/05/19 0052 02/05/19 0123 02/05/19 0601 02/05/19 0811  BP: 127/85  120/69 115/70  Pulse: 73  89 69  Resp: 20  19 20   Temp: 98.2 F (36.8 C)  98.1 F (36.7 C) 98.1 F (36.7 C)  TempSrc: Oral  Oral Oral  SpO2: 94%  94% 97%  Weight:  78.1 kg    Height:        Intake/Output Summary (Last 24 hours) at 02/05/2019 0835 Last data filed at 02/05/2019 0300 Gross per 24 hour  Intake 852.9 ml  Output 600 ml  Net 252.9 ml   Filed Weights   02/04/19 1430 02/05/19 0123  Weight: 78 kg 78.1 kg    Physical Exam    GEN- The patient is well appearing, alert and oriented x 3 today.   Head- normocephalic, atraumatic Eyes-  Sclera clear, conjunctiva pink Ears- hearing intact Oropharynx- clear Neck- supple Lungs- Clear to ausculation bilaterally, normal work of breathing Heart- Regular rate and rhythm, no murmurs, rubs or  gallops GI- soft, NT, ND, + BS Extremities- no clubbing, cyanosis, or edema Skin- no rash or lesion Psych- euthymic mood, full affect Neuro- strength and sensation are intact  Labs    CBC Recent Labs    02/03/19 1902 02/05/19 0451  WBC 4.7 4.9  HGB 10.9* 11.0*  HCT 33.0* 34.1*  MCV 92.2 93.2  PLT 173 123456   Basic Metabolic Panel Recent Labs    02/03/19 1902 02/04/19 1312 02/05/19 0451  NA 140  --  138  K 3.7  --  4.0  CL 108  --  106  CO2 20*  --  23  GLUCOSE 113*  --  100*  BUN 10  --  10  CREATININE 1.09  --  1.49*  CALCIUM 9.5  --  8.9  MG  --  1.8 1.6*   Liver Function Tests No results for input(s): AST, ALT, ALKPHOS, BILITOT, PROT, ALBUMIN in the last 72 hours. No results for input(s): LIPASE, AMYLASE in the last 72 hours. Cardiac Enzymes No results for input(s): CKTOTAL, CKMB, CKMBINDEX, TROPONINI in the last 72 hours.   Telemetry    AF 70-90s (personally reviewed)  Radiology    DG Chest 2 View  Result Date: 02/03/2019 CLINICAL DATA:  Shortness of breath EXAM: CHEST - 2 VIEW COMPARISON:  12/09/2018 FINDINGS: Left AICD. Cardiomegaly with  vascular congestion. Airspace disease noted in the lower lobes bilaterally, right greater than left. This is slightly increased at the left base, similar on the right since prior study. Suspect small right pleural effusion. No acute bony abnormality. IMPRESSION: Persistent right basilar infiltrate. Increasing left basilar atelectasis or infiltrate. Cardiomegaly. Electronically Signed   By: Rolm Baptise M.D.   On: 02/03/2019 19:26   US Abdomen Limited RUQ  Result Date: 02/04/2019 CLINICAL DATA:  Right upper quadrant pain. Cholecystitis. Cholecystostomy tube. EXAM: ULTRASOUND ABDOMEN LIMITED RIGHT UPPER QUADRANT COMPARISON:  Ultrasound 12/20/2018. FINDINGS: Gallbladder: Gallbladder is decompressed. Multiple shadowing gallstones. Cholecystostomy tube is visualized extending into the decompressed gallbladder. Gallbladder wall  thickening of 4 mm. Small amount of pericholecystic fluid. Positive sonographic Murphy sign noted by sonographer. Common bile duct: Diameter: 5 mm. Liver: No focal lesion identified. Within normal limits in parenchymal echogenicity. Portal vein is patent on color Doppler imaging with normal direction of blood flow towards the liver. Other: None. IMPRESSION: 1. Cholecystostomy tube decompressing the gallbladder. Multiple gallstones with gallbladder wall thickening and small amount pericholecystic fluid. There is a positive sonographic Murphy sign. Other than trace pericholecystic fluid, sonographic findings are unchanged from 12/20/2018. 2. No biliary dilatation. Electronically Signed   By: Ameet Rake M.D.   On: 02/04/2019 03:04    Patient Profile     Glenn [name] is a 68 y.o. male with a history of ICD for NICM, Persistent AF previously on tikosyn, CVA, HTN, HF, and Hepatitis C who is being seen today for the evaluation of AF with RVR at the request of Dr. Johnsie Cancel.  Assessment & Plan    1.  Persistent Atrial fibrillation with RVR Remains in AF. Double checked with Endo, no TEE/DCCV spot for today. Left on schedule for tomorrow at 0815. Continue pradaza for CHA2DS2VASC of at least 5    Continue IV amiodarone. Post DCCV can transition to 400 mg daily and continue until follow up.    2. Acute on chronic systolic CHF s/p [employer] Sci single chamber ICD 2015 Likely in the setting of AF with RVR Echo 12/07/2018 showed LVEF 20% Creatinine up someone on IV lasix. Resume po lasix and follow.    3. H/o recent hemorrhagic stroke 10/2018 Follow closely on pradaxa.    4. Hypomagnesemia 1.6 this am. Supplementation ordered.   For questions or updates, please contact [place] Please consult [email or web address] for contact info under Cardiology/STEMI.  Signed, Glenn Friar, Glenn [name]  02/05/2019, 8:35 AM   I have seen and examined this patient with Glenn [name].  Agree with above, note  added to reflect my findings.  On exam, RRR, no murmurs, lungs clear.  Patient remains in atrial fibrillation today.  We Seila Liston plan to continue amiodarone and [name].  Plan for TEE and cardioversion tomorrow.  Glenn [name] 02/05/2019 10:46 AM

## 2019-02-05 NOTE — Progress Notes (Addendum)
PROGRESS NOTE   Glenn [name]  [id number]    DOB: 05-23-1950    DOA: 02/03/2019  PCP: [name], MD   I have briefly reviewed patients previous medical records in [facility].  Chief Complaint:   Chief Complaint  Patient presents with  . Shortness of Breath    Brief Narrative:  68 year old male with PMH of persistent A. fib, nonischemic cardiomyopathy, chronic systolic CHF with LVEF 123456, ICD, hemorrhagic infarct/CVA 10/2018 following which Eliquis was discontinued and then [name] resumed 12/23/2018, essential hypertension, hepatitis C, left leg DVT s/p IVC filter 12/04/2018, cholecystitis with percutaneous cholecystostomy tube in place, presented to [facility] ED on 02/03/2019 due to dyspnea and worsening leg swelling.  Reportedly been out of Lasix since returning home from SNF 1 to 2 weeks ago and also?  Not on anticoagulants.  Admitted for A. fib with RVR and acute on chronic systolic CHF.  Cardiology consulted.   Assessment & Plan:  Principal Problem:   Atrial fibrillation with RVR ([name]) Active Problems:   Acute on chronic systolic CHF (congestive heart failure) ([name])   HTN (hypertension)   History of hemorrhagic stroke with residual hemiparesis ([name])   Left leg DVT ([name])   Cholecystitis   Persistent A. fib with RVR.  Has failed DCCV in the past, had been on Tikosyn stopped due to prolonged QTC.  Presented with RVR in the 160s.  In ED was given diltiazem bolus and briefly on IV Cardizem infusion.  However due to low EF, changed to IV amiodarone.  CHADS-VASc at least 3 (age, CHF, HTN) and he has LLE DVT; will continue [name] .  [name] reportedly resumed by Neurology in November.  Cardiology input appreciated,  currently on Toprol-XL 100 mg daily, IV amiodarone infusion and digoxin 0.25 mg daily continued after IV load yesterday.  Plans to transition to amiodarone 400 mg daily post DCCV.  Controlled ventricular rate now.  EP cardiology input appreciated,  plan for TEE/DCCV on 12/18 at 8:15 AM.  Acute on chronic systolic CHF/nonischemic cardiomyopathy/ICD  Last EF 20%.  Last cardiac cath 11/03/2018 showed patent coronaries.    Reportedly not discharged on Lasix after recent SNF admission.  Unsure if rapid A. fib precipitated heart failure or other way around.  Cardiology input appreciated.  Was on IV Lasix 40 mg twice daily.  Clinically appears euvolemic.  Creatinine has gone up to 1.4.  Cardiology have switched Lasix to 40 mg p.o. daily.  Acute kidney injury  Creatinine went up from 1.09 yesterday to 1.49 today.  Likely due to aggressive diuresis.  IV Lasix stopped.  Oral Lasix started.  Follow BMP in a.m.  History of hemorrhagic CVA 10/2018  Residual mild left hemiparesis.  PT recommends home health PT at discharge.  Anticoagulation/Eliquis was held and as per neurology, [name] started on 12/23/2018 but reportedly patient not discharged from [facility] with [name].  Continue [name].  Left lower extremity and left upper extremity DVT  S/p IVC filter placement.  Back on anticoagulation/[name].  Acute cholecystitis s/p percutaneous cholecystostomy  Reported some abdominal pain in ED.  Hence RUQ ultrasound obtained which shows cholecystostomy tube decompressing the gallbladder.  Multiple gallstones with gallbladder wall thickening and small amount of pericholecystic fluid.  Positive sonographic Percell Miller sign but unable to elicit same on exam.  Other than trace pericholecystic fluid, sonographic findings unchanged from 12/20/2018.  No biliary dilatation.  Percutaneous cholecystostomy placed by IR on 10/24 with plans to remain for 8 weeks.  Monitor for recurrent symptoms.  Asymptomatic of abdominal pain.  Hypomagnesemia  Magnesium 1.6 after IV replacement yesterday.  Replace IV again today and follow in a.m.  Anemia, suspect chronic disease  Stable.  Essential hypertension  Controlled.   DVT prophylaxis: [name] Code  Status: Full Family Communication: None at bedside. I discussed with patient's son, updated care and answered questions. Disposition: DC home pending clinical improvement, plans for TEE/DCCV 12/18 and will likely be monitored overnight and should be able to discharge 12/19.   Consultants:   Cardiology EP cardiology  Procedures:   Patient has right percutaneous cholecystostomy drain, present prior to admission.  Antimicrobials:   None   Subjective:  Denies complaints.  Overall feels much better compared to admission.  No dyspnea, chest pain or palpitations.  Reports some soreness in the right forearm IV site but does not appear infiltrated.  Also has left neck EJ PIV.  As per RN, no acute issues noted.  Objective:   Vitals:   02/05/19 0811 02/05/19 0842 02/05/19 0853 02/05/19 1050  BP: 115/70 139/76  105/90  Pulse: 69 66 84 98  Resp: 20 18  18   Temp: 98.1 F (36.7 C) 98 F (36.7 C)  98 F (36.7 C)  TempSrc: Oral Oral  Oral  SpO2: 97% 96%  98%  Weight:      Height:        General exam: Pleasant middle-age male, moderately built and nourished lying comfortably propped up in bed without distress. Respiratory system: Clear to auscultation.  No increased work of breathing. Cardiovascular system: S1 and S2 heard, irregularly irregular.  No JVD, murmurs or pedal edema.  Telemetry personally reviewed: A. fib with controlled ventricular rate.  5 beat NSVT. Gastrointestinal system: Abdomen is nondistended, soft and nontender today. No organomegaly or masses felt. Normal bowel sounds heard. Central nervous system: Alert and oriented. No focal neurological deficits. Extremities: Left leg mildly asymmetrically swollen compared to right without acute findings.  Mild left hemiparesis. Skin: No rashes, lesions or ulcers Psychiatry: Judgement and insight appear normal. Mood & affect appropriate.     Data Reviewed:   I have personally reviewed following labs and imaging  studies   CBC: Recent Labs  Lab 02/03/19 1902 02/05/19 0451  WBC 4.7 4.9  HGB 10.9* 11.0*  HCT 33.0* 34.1*  MCV 92.2 93.2  PLT 173 123456    Basic Metabolic Panel: Recent Labs  Lab 02/03/19 1902 02/04/19 0359 02/04/19 1312 02/05/19 0451  NA 140  --   --  138  K 3.7  --   --  4.0  CL 108  --   --  106  CO2 20*  --   --  23  GLUCOSE 113*  --   --  100*  BUN 10  --   --  10  CREATININE 1.09  --   --  1.49*  CALCIUM 9.5  --   --  8.9  MG  --  1.5* 1.8 1.6*    Liver Function Tests: No results for input(s): AST, ALT, ALKPHOS, BILITOT, PROT, ALBUMIN in the last 168 hours.  CBG: Recent Labs  Lab 02/04/19 2119 02/05/19 0604 02/05/19 1046  GLUCAP 142* 93 104*    Microbiology Studies:   Recent Results (from the past 240 hour(s))  SARS CORONAVIRUS 2 (TAT 6-24 HRS) Nasopharyngeal Nasopharyngeal Swab     Status: None   Collection Time: 02/03/19  9:19 PM   Specimen: Nasopharyngeal Swab  Result Value Ref Range Status   SARS Coronavirus 2 NEGATIVE NEGATIVE  Final    Comment: (NOTE) SARS-CoV-2 target nucleic acids are NOT DETECTED. The SARS-CoV-2 RNA is generally detectable in upper and lower respiratory specimens during the acute phase of infection. Negative results do not preclude SARS-CoV-2 infection, do not rule out co-infections with other pathogens, and should not be used as the sole basis for treatment or other patient management decisions. Negative results must be combined with clinical observations, patient history, and epidemiological information. The expected result is Negative. Fact Sheet for Patients: SugarRoll.be Fact Sheet for Healthcare Providers: https://www.woods-mathews.com/ This test is not yet approved or cleared by the [place] FDA and  has been authorized for detection and/or diagnosis of SARS-CoV-2 by FDA under an Emergency Use Authorization (EUA). This EUA will remain  in effect (meaning this test  can be used) for the duration of the COVID-19 declaration under Section 56 4(b)(1) of the Act, 21 U.S.C. section 360bbb-3(b)(1), unless the authorization is terminated or revoked sooner. Performed at [facility], [place] [address], [place], [place] [address]      Radiology Studies:  No results found.   Scheduled Meds:   . atorvastatin  20 mg Oral q1800  . dabigatran  150 mg Oral Q12H  . digoxin  0.25 mg Intravenous Once  . digoxin  0.25 mg Oral Daily  . furosemide  40 mg Oral Daily  . gabapentin  300 mg Oral Q8H  . influenza vaccine adjuvanted  0.5 mL Intramuscular Tomorrow-1000  . metoprolol succinate  100 mg Oral Daily  . pantoprazole  40 mg Oral Daily  . potassium chloride SA  20 mEq Oral Daily  . sodium chloride flush  3 mL Intravenous Q12H    Continuous Infusions:   . sodium chloride    . amiodarone 30 mg/hr (02/05/19 [id number])     LOS: 1 day     Vernell Leep, MD, [place], [facility]. [facility]    To contact the attending provider between 7A-7P or the covering provider during after hours 7P-7A, please log into the web site [email or web address] and access using universal [place] password for that web site. If you do not have the password, please call the hospital operator.  02/05/2019, 2:34 PM

## 2019-02-06 ENCOUNTER — Ambulatory Visit (INDEPENDENT_AMBULATORY_CARE_PROVIDER_SITE_OTHER): Payer: Medicare HMO | Admitting: *Deleted

## 2019-02-06 ENCOUNTER — Encounter (HOSPITAL_COMMUNITY): Payer: Self-pay | Admitting: Family Medicine

## 2019-02-06 ENCOUNTER — Inpatient Hospital Stay (HOSPITAL_COMMUNITY): Payer: Medicare HMO | Admitting: Certified Registered"

## 2019-02-06 ENCOUNTER — Encounter (HOSPITAL_COMMUNITY)
Admission: EM | Disposition: A | Discharge status: Home Health | Payer: Self-pay | Source: Home / Self Care | Attending: Internal Medicine

## 2019-02-06 ENCOUNTER — Inpatient Hospital Stay (HOSPITAL_COMMUNITY): Payer: Medicare HMO

## 2019-02-06 DIAGNOSIS — I420 Dilated cardiomyopathy: Secondary | ICD-10-CM | POA: Diagnosis not present

## 2019-02-06 DIAGNOSIS — I340 Nonrheumatic mitral (valve) insufficiency: Secondary | ICD-10-CM

## 2019-02-06 DIAGNOSIS — I4891 Unspecified atrial fibrillation: Secondary | ICD-10-CM

## 2019-02-06 DIAGNOSIS — I361 Nonrheumatic tricuspid (valve) insufficiency: Secondary | ICD-10-CM

## 2019-02-06 HISTORY — PX: TEE WITHOUT CARDIOVERSION: SHX5443

## 2019-02-06 LAB — BASIC METABOLIC PANEL
Anion gap: 10 (ref 5–15)
BUN: 13 mg/dL (ref 8–23)
CO2: 24 mmol/L (ref 22–32)
Calcium: 8.7 mg/dL — ABNORMAL LOW (ref 8.9–10.3)
Chloride: 104 mmol/L (ref 98–111)
Creatinine, Ser: 1.2 mg/dL (ref 0.61–1.24)
GFR calc Af Amer: >60 mL/min (ref >60)
GFR calc non Af Amer: >60 mL/min (ref >60)
Glucose, Bld: 107 mg/dL — ABNORMAL HIGH (ref 70–99)
Potassium: 4.1 mmol/L (ref 3.5–5.1)
Sodium: 138 mmol/L (ref 135–145)

## 2019-02-06 LAB — GLUCOSE, CAPILLARY
Glucose-Capillary: 106 mg/dL — ABNORMAL HIGH (ref 70–99)
Glucose-Capillary: 97 mg/dL (ref 70–99)
Glucose-Capillary: 87 mg/dL (ref 70–99)
Glucose-Capillary: 112 mg/dL — ABNORMAL HIGH (ref 70–99)

## 2019-02-06 LAB — MAGNESIUM: Magnesium: 2 mg/dL (ref 1.7–2.4)

## 2019-02-06 LAB — PROTIME-INR
INR: 1.6 — ABNORMAL HIGH (ref 0.8–1.2)
Prothrombin Time: 19.3 seconds — ABNORMAL HIGH (ref 11.4–15.2)

## 2019-02-06 SURGERY — ECHOCARDIOGRAM, TRANSESOPHAGEAL
Anesthesia: General

## 2019-02-06 MED ORDER — SACUBITRIL-VALSARTAN 24-26 MG PO TABS
1.0000 | ORAL_TABLET | Freq: Two times a day (BID) | ORAL | Status: DC
  Administered 2019-02-06 – 2019-02-07 (×2): 1 via ORAL
  Filled 2019-02-06 (×2): qty 1

## 2019-02-06 MED ORDER — KETAMINE HCL 50 MG/ML IJ SOLN
INTRAMUSCULAR | Status: DC | PRN
  Administered 2019-02-06: 10 mg via INTRAMUSCULAR

## 2019-02-06 MED ORDER — PHENYLEPHRINE 40 MCG/ML (10ML) SYRINGE FOR IV PUSH (FOR BLOOD PRESSURE SUPPORT)
PREFILLED_SYRINGE | INTRAVENOUS | Status: DC | PRN
  Administered 2019-02-06 (×4): 40 ug via INTRAVENOUS

## 2019-02-06 MED ORDER — SPIRONOLACTONE 25 MG PO TABS
25.0000 mg | ORAL_TABLET | Freq: Every day | ORAL | Status: DC
  Administered 2019-02-06 – 2019-02-07 (×2): 25 mg via ORAL
  Filled 2019-02-06 (×2): qty 1

## 2019-02-06 MED ORDER — PROPOFOL 10 MG/ML IV BOLUS
INTRAVENOUS | Status: DC | PRN
  Administered 2019-02-06 (×7): 10 mg via INTRAVENOUS

## 2019-02-06 MED ORDER — LIDOCAINE HCL (CARDIAC) PF 100 MG/5ML IV SOSY
PREFILLED_SYRINGE | INTRAVENOUS | Status: DC | PRN
  Administered 2019-02-06: 30 mg via INTRAVENOUS

## 2019-02-06 MED ORDER — KETAMINE HCL 50 MG/5ML IJ SOSY
PREFILLED_SYRINGE | INTRAMUSCULAR | Status: AC
  Filled 2019-02-06: qty 5

## 2019-02-06 MED ORDER — SODIUM CHLORIDE 0.9 % IV SOLN: INTRAVENOUS | Status: DC

## 2019-02-06 NOTE — Progress Notes (Signed)
Physical Therapy Treatment Patient Details Name: Glenn [name] MRN: [id number] DOB: [birth year] Today's Date: 02/06/2019    History of Present Illness 68 y.o. male with medical history significant for chronic systolic CHF with EF 123456, history of hemorrhagic CVA with left-sided hemiparesis, left leg DVT with IVC filter, atrial fibrillation started back on [name] during recent hospitalization, and cholecystitis with percutaneous cholecystostomy tube in place, now presenting to the emergency department for evaluation of shortness of breath and increased leg swelling.    PT Comments    Pt tolerated treatment well demonstrating improved ambulation tolerance and stability this session with use of RW. Pt with some noted confusion during session, new onset compared to previous session, possibly related to sedative from TEE this morning. Pt will continue to benefit from acute PT POC to improve stability in mobility and aide in a return to independent mobility.   Follow Up Recommendations  Home health PT;Supervision - Intermittent     Equipment Recommendations  Rolling walker with 5" wheels    Recommendations for Other Services       Precautions / Restrictions Precautions Precautions: Fall Restrictions Weight Bearing Restrictions: No    Mobility  Bed Mobility Overal bed mobility: Modified Independent                Transfers Overall transfer level: Needs assistance Equipment used: Rolling walker (2 wheeled) Transfers: Sit to/from Stand Sit to Stand: Supervision         General transfer comment: pt requiring increased rocking and momentum from low bed-level surface. Able to stand with improved ease from recliner  Ambulation/Gait Ambulation/Gait assistance: Modified independent (Device/Increase time) Gait Distance (Feet): 250 Feet Assistive device: Rolling walker (2 wheeled) Gait Pattern/deviations: Step-through pattern;Trunk flexed Gait velocity: functional Gait  velocity interpretation: 1.31 - 2.62 ft/sec, indicative of limited community ambulator General Gait Details: pt with slight increase in trunk flexion and bumping into 1-2 objects in room during ambulation but able to navigate around objects without further PT cues.   Stairs Stairs: (pt declines the need for stair negotiation assessment)           Wheelchair Mobility    Modified Rankin (Stroke Patients Only)       Balance Overall balance assessment: Modified Independent Sitting-balance support: No upper extremity supported;Feet supported Sitting balance-Leahy Scale: Normal     Standing balance support: Bilateral upper extremity supported Standing balance-Leahy Scale: Good Standing balance comment: modI with BUE support of RW                            Cognition Arousal/Alertness: Awake/alert Behavior During Therapy: WFL for tasks assessed/performed Overall Cognitive Status: Impaired/Different from baseline Area of Impairment: Safety/judgement;Problem solving                         Safety/Judgement: Decreased awareness of deficits   Problem Solving: Slow processing General Comments: pt intermittently confused during session, ambulating past room and attempting to make phone call while PT providing commands for therapeutic exercise. Confusion may be related to sedation during TEE this morning      Exercises Other Exercises Other Exercises: squats, 10 repetitions from recliner    General Comments General comments (skin integrity, edema, etc.): VSS, pt mildly tachy for brief time during ambulation to 123 but ranging from 100-110 for most of activity. Pt asymptomatic      Pertinent Vitals/Pain Pain Assessment: No/denies pain    Home Living  Prior Function            PT Goals (current goals can now be found in the care plan section) Acute Rehab PT Goals Patient Stated Goal: To return to independent  mobility Progress towards PT goals: Progressing toward goals    Frequency    Min 3X/week      PT Plan Current plan remains appropriate    Co-evaluation              AM-PAC PT "6 Clicks" Mobility   Outcome Measure  Help needed turning from your back to your side while in a flat bed without using bedrails?: None Help needed moving from lying on your back to sitting on the side of a flat bed without using bedrails?: None Help needed moving to and from a bed to a chair (including a wheelchair)?: None Help needed standing up from a chair using your arms (e.g., wheelchair or bedside chair)?: None Help needed to walk in hospital room?: None Help needed climbing 3-5 steps with a railing? : A Little 6 Click Score: 23    End of Session Equipment Utilized During Treatment: (none) Activity Tolerance: Patient tolerated treatment well Patient left: in chair;with call bell/phone within reach Nurse Communication: Mobility status PT Visit Diagnosis: Other abnormalities of gait and mobility (R26.89)     Time: [id number] PT Time Calculation (min) (ACUTE ONLY): 15 min  Charges:  $Gait Training: 8-22 mins                     Zenaida Niece, PT, DPT Acute Rehabilitation Pager: [phone]    Zenaida Niece 02/06/2019, 2:54 PM

## 2019-02-06 NOTE — Progress Notes (Signed)
Received call from pharmacy regarding dose adjustment of gabapentin. It is to be given as scheduled at 2200 with a specific order not to give it after midnight. Will administer as ordered.

## 2019-02-06 NOTE — Interval H&P Note (Signed)
History and Physical Interval Note:  02/06/2019 8:13 AM  Glenn [name]  has presented today for surgery, with the diagnosis of AFIB.  The various methods of treatment have been discussed with the patient and family. After consideration of risks, benefits and other options for treatment, the patient has consented to  Procedure(s): TRANSESOPHAGEAL ECHOCARDIOGRAM (TEE) (N/A) CARDIOVERSION (N/A) as a surgical intervention.  The patient's history has been reviewed, patient examined, no change in status, stable for surgery.  I have reviewed the patient's chart and labs.  Questions were answered to the patient's satisfaction.     Skeet Latch, MD

## 2019-02-06 NOTE — CV Procedure (Signed)
Brief TEE Note  LVEF 15-20%.  Global hypokinesis RV systolic function mildly reduced  Mild MR and PR. Moderate AR Moderate to severe TR Small mobile thrombus in the left atrial appendage tip No vegetation or thrombus noted on PPM leads  Cardioversion was not performed due to LAA thrombus. For additional details see full report.   Glenn Dubree C. Oval Linsey, MD, [facility] 02/06/2019 8:47 AM

## 2019-02-06 NOTE — Anesthesia Procedure Notes (Signed)
Procedure Name: MAC Date/Time: 02/06/2019 8:18 AM Performed by: Mariea Clonts, CRNA Pre-anesthesia Checklist: Patient identified, Emergency Drugs available, Suction available, Patient being monitored and Timeout performed Oxygen Delivery Method: Simple face mask and Nasal cannula

## 2019-02-06 NOTE — Transfer of Care (Signed)
Immediate Anesthesia Transfer of Care Note  Patient: Glenn [name]  Procedure(s) Performed: TRANSESOPHAGEAL ECHOCARDIOGRAM (TEE) (N/A )  Patient Location: Endoscopy Unit  Anesthesia Type:MAC  Level of Consciousness: awake, alert  and oriented  Airway & Oxygen Therapy: Patient Spontanous Breathing and Patient connected to nasal cannula oxygen  Post-op Assessment: Report given to RN, Post -op Vital signs reviewed and stable and Patient moving all extremities X 4  Post vital signs: Reviewed and stable  Last Vitals:  Vitals Value Taken Time  BP 117/66 02/06/19 0854  Temp    Pulse 62 02/06/19 0854  Resp 20 02/06/19 0854  SpO2 100 % 02/06/19 0854  Vitals shown include unvalidated device data.  Last Pain:  Vitals:   02/06/19 0458  TempSrc: Oral  PainSc:          Complications: No apparent anesthesia complications

## 2019-02-06 NOTE — Anesthesia Postprocedure Evaluation (Signed)
Anesthesia Post Note  Patient: Glenn [name]  Procedure(s) Performed: TRANSESOPHAGEAL ECHOCARDIOGRAM (TEE) (N/A )     Patient location during evaluation: PACU Anesthesia Type: General Level of consciousness: awake and alert Pain management: pain level controlled Vital Signs Assessment: post-procedure vital signs reviewed and stable Respiratory status: spontaneous breathing, nonlabored ventilation and respiratory function stable Cardiovascular status: blood pressure returned to baseline and stable Postop Assessment: no apparent nausea or vomiting Anesthetic complications: no    Last Vitals:  Vitals:   02/06/19 1036 02/06/19 1154  BP: 127/77 136/70  Pulse: 79 70  Resp:  18  Temp:  36.5 C  SpO2:  100%    Last Pain:  Vitals:   02/06/19 1154  TempSrc: Oral  PainSc:                  Catalina Gravel

## 2019-02-06 NOTE — Progress Notes (Signed)
PROGRESS NOTE   Glenn [name]  [id number]    DOB: July 25, 1950    DOA: 02/03/2019  PCP: [name], MD   I have briefly reviewed patients previous medical records in [facility].  Chief Complaint:   Chief Complaint  Patient presents with  . Shortness of Breath    Brief Narrative:  68 year old male with PMH of persistent A. fib, nonischemic cardiomyopathy, chronic systolic CHF with LVEF 123456, ICD, hemorrhagic infarct/CVA 10/2018 following which Eliquis was discontinued and then [name] resumed 12/23/2018, essential hypertension, hepatitis C, left leg DVT s/p IVC filter 12/04/2018, cholecystitis with percutaneous cholecystostomy tube in place, presented to [facility] ED on 02/03/2019 due to dyspnea and worsening leg swelling.  Reportedly been out of Lasix since returning home from SNF 1 to 2 weeks ago and also?  Not on anticoagulants.  Admitted for A. fib with RVR and acute on chronic systolic CHF.  Cardiology consulted.  Treated with IV heparin drip, IV amiodarone, digoxin with load.  TEE 12/18 showed LAA thrombus and hence cardioversion was not performed.   Assessment & Plan:  Principal Problem:   Atrial fibrillation with RVR ([name]) Active Problems:   Acute on chronic systolic CHF (congestive heart failure) ([name])   HTN (hypertension)   History of hemorrhagic stroke with residual hemiparesis ([name])   Left leg DVT ([name])   Cholecystitis   Persistent A. fib with RVR.  Has failed DCCV in the past, had been on Tikosyn stopped due to prolonged QTC.  Presented with RVR in the 160s.  In ED was given diltiazem bolus and briefly on IV Cardizem infusion.  However due to low EF, changed to IV amiodarone.  CHADS-VASc at least 3 (age, CHF, HTN) and he has LLE DVT; will continue [name] .  [name] reportedly resumed by Neurology in November.  Cardiology input appreciated,  currently on Toprol-XL 100 mg daily, and digoxin 0.25 mg daily after IV load.    Controlled ventricular rate  now.  TEE 12/18 showed LAA thrombus and hence cardioversion was not performed.  Amiodarone drip seems to have been stopped.  Unclear if cardiology plans for oral amiodarone.  Acute on chronic systolic CHF/nonischemic cardiomyopathy/ICD  Last EF 20%.  Last cardiac cath 11/03/2018 showed patent coronaries.    Reportedly not discharged on Lasix after recent SNF admission.  Unsure if rapid A. fib precipitated heart failure or other way around.  Cardiology input appreciated.  Was on IV Lasix 40 mg twice daily.  Clinically appears euvolemic.  Creatinine has gone up to 1.4.  Cardiology have switched Lasix to 40 mg p.o. daily.  Creatinine has improved to 1.2.  Appears compensated now.  Acute kidney injury  Creatinine has improved from 1.49-1.2.  Was likely due to aggressive IV diuresis.  History of hemorrhagic CVA 10/2018  Residual mild left hemiparesis.  PT recommends home health PT at discharge.  Anticoagulation/Eliquis was held and as per neurology, [name] started on 12/23/2018 but reportedly patient not discharged from [facility] with [name].  Continue [name].  Left lower extremity and left upper extremity DVT  S/p IVC filter placement.  Back on anticoagulation/[name].  Acute cholecystitis s/p percutaneous cholecystostomy  Reported some abdominal pain in ED.  Hence RUQ ultrasound obtained which shows cholecystostomy tube decompressing the gallbladder.  Multiple gallstones with gallbladder wall thickening and small amount of pericholecystic fluid.  Positive sonographic Percell Miller sign but unable to elicit same on exam.  Other than trace pericholecystic fluid, sonographic findings unchanged from 12/20/2018.  No biliary dilatation.  Percutaneous cholecystostomy placed by IR on 10/24 with plans to remain for 8 weeks.  Monitor for recurrent symptoms.  Asymptomatic of abdominal pain.  Hypomagnesemia  Replaced/2 today.  Anemia, suspect chronic disease  Stable.  Essential  hypertension  Controlled.  NSVT  Potassium and Magnesium okay as noted below.  Continue Toprol-XL.   DVT prophylaxis: [name] Code Status: Full Family Communication: None at bedside. I discussed with patient's son on 12/17, updated care and answered questions. Disposition: DC home pending clinical improvement and cardiology clearance, possibly 12/19   Consultants:   Cardiology EP cardiology  Procedures:   Patient has right percutaneous cholecystostomy drain, present prior to admission. TEE 12/18:  Brief TEE Note  LVEF 15-20%.  Global hypokinesis RV systolic function mildly reduced  Mild MR and PR. Moderate AR Moderate to severe TR Small mobile thrombus in the left atrial appendage tip No vegetation or thrombus noted on PPM leads  Cardioversion was not performed due to LAA thrombus.   Antimicrobials:   None   Subjective:  Patient seen this morning post procedure.  Denied complaints.  No shortness of breath, chest pain.  Objective:   Vitals:   02/06/19 0902 02/06/19 0912 02/06/19 1036 02/06/19 1154  BP: 126/67 135/72 127/77 136/70  Pulse: 91 73 79 70  Resp: (!) 21 19  18   Temp:    97.7 F (36.5 C)  TempSrc:    Oral  SpO2: 97% 99%  100%  Weight:      Height:        General exam: Pleasant middle-age male, moderately built and nourished lying comfortably propped up in bed without distress. Respiratory system: Clear to auscultation.  No increased work of breathing. Cardiovascular system: S1 and S2 heard, RRR.  No JVD, murmurs or pedal edema.  Telemetry personally reviewed: A. fib with controlled ventricular rate.  Occasional V paced PVCs.  7 beat NSVT. Gastrointestinal system: Abdomen is nondistended, soft and nontender today. No organomegaly or masses felt. Normal bowel sounds heard. Central nervous system: Alert and oriented. No focal neurological deficits. Extremities: Left leg mildly asymmetrically swollen compared to right without acute findings.  Mild  left hemiparesis. Skin: No rashes, lesions or ulcers Psychiatry: Judgement and insight appear normal. Mood & affect appropriate.     Data Reviewed:   I have personally reviewed following labs and imaging studies   CBC: Recent Labs  Lab 02/03/19 1902 02/05/19 0451  WBC 4.7 4.9  HGB 10.9* 11.0*  HCT 33.0* 34.1*  MCV 92.2 93.2  PLT 173 123456    Basic Metabolic Panel: Recent Labs  Lab 02/03/19 1902 02/04/19 1312 02/05/19 0451 02/06/19 0443  NA 140  --  138 138  K 3.7  --  4.0 4.1  CL 108  --  106 104  CO2 20*  --  23 24  GLUCOSE 113*  --  100* 107*  BUN 10  --  10 13  CREATININE 1.09  --  1.49* 1.20  CALCIUM 9.5  --  8.9 8.7*  MG  --  1.8 1.6* 2.0    Liver Function Tests: No results for input(s): AST, ALT, ALKPHOS, BILITOT, PROT, ALBUMIN in the last 168 hours.  CBG: Recent Labs  Lab 02/05/19 2143 02/06/19 0637 02/06/19 1155  GLUCAP 98 106* 97    Microbiology Studies:   Recent Results (from the past 240 hour(s))  SARS CORONAVIRUS 2 (TAT 6-24 HRS) Nasopharyngeal Nasopharyngeal Swab     Status: None   Collection Time: 02/03/19  9:19 PM  Specimen: Nasopharyngeal Swab  Result Value Ref Range Status   SARS Coronavirus 2 NEGATIVE NEGATIVE Final    Comment: (NOTE) SARS-CoV-2 target nucleic acids are NOT DETECTED. The SARS-CoV-2 RNA is generally detectable in upper and lower respiratory specimens during the acute phase of infection. Negative results do not preclude SARS-CoV-2 infection, do not rule out co-infections with other pathogens, and should not be used as the sole basis for treatment or other patient management decisions. Negative results must be combined with clinical observations, patient history, and epidemiological information. The expected result is Negative. Fact Sheet for Patients: SugarRoll.be Fact Sheet for Healthcare Providers: https://www.woods-mathews.com/ This test is not yet approved or cleared  by the [place] FDA and  has been authorized for detection and/or diagnosis of SARS-CoV-2 by FDA under an Emergency Use Authorization (EUA). This EUA will remain  in effect (meaning this test can be used) for the duration of the COVID-19 declaration under Section 56 4(b)(1) of the Act, 21 U.S.C. section 360bbb-3(b)(1), unless the authorization is terminated or revoked sooner. Performed at [facility], [place] [address] [place], [place] [address]      Radiology Studies:  ECHO TEE  Result Date: Feb 12, 2019   TRANSESOPHOGEAL ECHO REPORT   Patient Name:   Glenn [name] Date of Exam: 02-12-2019 Medical Rec #:  [id number]           Height:       67.0 in Accession #:    [id number]          Weight:       172.4 lb Date of Birth:  January 22, 1951           BSA:          1.90 m Patient Age:    78 years            BP:           148/90 mmHg Patient Gender: M                   HR:           64 bpm. Exam Location:  Inpatient  Procedure: Transesophageal Echo Indications:     Atrial Fibrillation  History:         Patient has prior history of Echocardiogram examinations, most                  recent 12/07/2018. DCM (dilated cardiomyopathy)                  AKI.  Sonographer:     Vikki Ports Turrentine Referring Phys:  [id number] [name] Diagnosing Phys: Skeet Latch MD  PROCEDURE: Consent was requested emergently by emergency room physicain. The transesophogeal probe was passed through the esophogus of the patient. The patient's vital signs; including heart rate, blood pressure, and oxygen saturation; remained stable throughout the procedure. The patient developed no complications during the procedure. IMPRESSIONS  1. Left ventricular ejection fraction, by visual estimation, is 15-20%. The left ventricle has severely decreased function. There is no left ventricular hypertrophy.  2. Mildly dilated left ventricular internal cavity size.  3. The left ventricle demonstrates global hypokinesis.  4.  Global right ventricle has moderately reduced systolic function.The right ventricular size is normal. No increase in right ventricular wall thickness.  5. Left atrial size was normal.  6. Moderate sized left atrial thrombus.  7. LA appendage thrombus 1.24 cm x 0.75 cm. Smoke noted in the LA appendage.  8.  Right atrial size was normal.  9. The mitral valve is normal in structure. Mild mitral valve regurgitation. No evidence of mitral stenosis. 10. The tricuspid valve is normal in structure. Tricuspid valve regurgitation moderate-severe. 11. Aortic valve regurgitation is moderate. 12. The aortic valve is normal in structure. Aortic valve regurgitation is moderate. No evidence of aortic valve sclerosis or stenosis. 13. The pulmonic valve was normal in structure. Pulmonic valve regurgitation is mild. 14. Minimal plaque invoving the descending aorta. 15. Moderately elevated pulmonary artery systolic pressure. 16. A pacer wire is visualized. 17. The inferior vena cava is normal in size with greater than 50% respiratory variability, suggesting right atrial pressure of 3 mmHg. FINDINGS  Left Ventricle: Left ventricular ejection fraction, by visual estimation, is 15-20%. The left ventricle has severely decreased function. The left ventricle demonstrates global hypokinesis. The left ventricular internal cavity size was mildly dilated left ventricle. There is no left ventricular hypertrophy. Normal left atrial pressure. Right Ventricle: The right ventricular size is normal. No increase in right ventricular wall thickness. Global RV systolic function is has moderately reduced systolic function. The tricuspid regurgitant velocity is 3.23 m/s, and with an assumed right atrial pressure of 10 mmHg, the estimated right ventricular systolic pressure is moderately elevated at 51.7 mmHg. Left Atrium: Left atrial size was normal in size. There is a moderate sized left atrial thrombus seen. LA appendage thrombus 1.24 cm x 0.75 cm. Smoke  noted in the LA appendage. Right Atrium: Right atrial size was normal in size Prominent Eustachian valve. Pericardium: There is no evidence of pericardial effusion. Mitral Valve: The mitral valve is normal in structure. No evidence of mitral valve stenosis by observation. Mild mitral valve regurgitation. Tricuspid Valve: The tricuspid valve is normal in structure. Tricuspid valve regurgitation moderate-severe. Aortic Valve: The aortic valve is normal in structure. Aortic valve regurgitation is moderate. Aortic regurgitation PHT measures 308 msec. The aortic valve is structurally normal, with no evidence of sclerosis or stenosis. Pulmonic Valve: The pulmonic valve was normal in structure. Pulmonic valve regurgitation is mild. Aorta: The aortic root, ascending aorta and aortic arch are all structurally normal, with no evidence of dilitation or obstruction. There is minimal, atheroma plaque involving the descending aorta. Venous: The inferior vena cava is normal in size with greater than 50% respiratory variability, suggesting right atrial pressure of 3 mmHg. Shunts: There is no evidence of a patent foramen ovale. No ventricular septal defect is seen or detected. There is no evidence of an atrial septal defect. No atrial level shunt detected by color flow Doppler. Additional Comments: A pacer wire is visualized.  AORTIC VALVE             Normals LVOT Vmax:   66.10 cm/s LVOT Vmean:  41.200 cm/s 75 cm/s LVOT VTI:    0.078 m     25.3 cm AI PHT:      308 msec TRICUSPID VALVE             Normals TR Peak grad:   41.7 mmHg TR Vmax:        323.00 cm/s 288 cm/s  SHUNTS Systemic VTI: 0.08 m  Skeet Latch MD Electronically signed by Skeet Latch MD Signature Date/Time: 02/06/2019/9:17:42 AM    Final      Scheduled Meds:   . atorvastatin  20 mg Oral q1800  . dabigatran  150 mg Oral Q12H  . digoxin  0.25 mg Intravenous Once  . digoxin  0.25 mg Oral Daily  . furosemide  40 mg Oral Daily  . gabapentin  300 mg Oral  Q8H  . influenza vaccine adjuvanted  0.5 mL Intramuscular Tomorrow-1000  . metoprolol succinate  100 mg Oral Daily  . pantoprazole  40 mg Oral Daily  . potassium chloride SA  20 mEq Oral Daily  . sodium chloride flush  3 mL Intravenous Q12H    Continuous Infusions:   . sodium chloride       LOS: 2 days     Vernell Leep, MD, [place], [facility]. [facility]    To contact the attending provider between 7A-7P or the covering provider during after hours 7P-7A, please log into the web site [email or web address] and access using universal [place] password for that web site. If you do not have the password, please call the hospital operator.  02/06/2019, 3:26 PM

## 2019-02-06 NOTE — Progress Notes (Addendum)
Electrophysiology Rounding Note  Patient Name: Glenn [name] Date of Encounter: 02/06/2019  Primary Cardiologist: Virl Axe, MD  Subjective   The patient is feeling OK this am. Wanting to know when he may be able to go home, and when he can eat.  Scheduled for TEE/DCCV this am at 0815.  At this time, the patient denies chest pain, shortness of breath, or any new concerns.  Inpatient Medications    Scheduled Meds: . [MAR Hold] atorvastatin  20 mg Oral q1800  . [MAR Hold] dabigatran  150 mg Oral Q12H  . [MAR Hold] digoxin  0.25 mg Intravenous Once  . [MAR Hold] digoxin  0.25 mg Oral Daily  . [MAR Hold] furosemide  40 mg Oral Daily  . [MAR Hold] gabapentin  300 mg Oral Q8H  . influenza vaccine adjuvanted  0.5 mL Intramuscular Tomorrow-1000  . [MAR Hold] metoprolol succinate  100 mg Oral Daily  . [MAR Hold] pantoprazole  40 mg Oral Daily  . [MAR Hold] potassium chloride SA  20 mEq Oral Daily  . [MAR Hold] sodium chloride flush  3 mL Intravenous Q12H   Continuous Infusions: . [MAR Hold] sodium chloride    . sodium chloride 20 mL/hr at 02/06/19 0736  . amiodarone 30 mg/hr (02/06/19 0727)   PRN Meds: [MAR Hold] sodium chloride, [MAR Hold] acetaminophen, [MAR Hold] calcium carbonate, [MAR Hold]  HYDROmorphone (DILAUDID) injection, [MAR Hold] ipratropium-albuterol, [MAR Hold] ondansetron (ZOFRAN) IV, [MAR Hold] sodium chloride flush, [MAR Hold] traMADol   Vital Signs    Vitals:   02/05/19 2142 02/06/19 0115 02/06/19 0458 02/06/19 0728  BP: 127/77 (!) 127/95 (!) 120/100 (!) 148/90  Pulse: 61 65 76 64  Resp: 16 16 16 18   Temp: 99 F (37.2 C) 98.1 F (36.7 C) 98.1 F (36.7 C)   TempSrc: Oral Oral Oral   SpO2: 98% 98% 99% 97%  Weight:   78.2 kg   Height:        Intake/Output Summary (Last 24 hours) at 02/06/2019 0820 Last data filed at 02/05/2019 2339 Gross per 24 hour  Intake 963 ml  Output 0 ml  Net 963 ml   Filed Weights   02/04/19 1430 02/05/19 0123  02/06/19 0458  Weight: 78 kg 78.1 kg 78.2 kg    Physical Exam    GEN- The patient is well appearing, alert and oriented x 3 today.   Head- normocephalic, atraumatic Eyes-  Sclera clear, conjunctiva pink Ears- hearing intact Oropharynx- clear Neck- supple Lungs- Clear to ausculation bilaterally, normal work of breathing Heart- Irregular rate and rhythm, no murmurs, rubs or gallops GI- soft, NT, ND, + BS Extremities- no clubbing, cyanosis, or edema Skin- no rash or lesion Psych- euthymic mood, full affect Neuro- strength and sensation are intact  Labs    CBC Recent Labs    02/03/19 1902 02/05/19 0451  WBC 4.7 4.9  HGB 10.9* 11.0*  HCT 33.0* 34.1*  MCV 92.2 93.2  PLT 173 123456   Basic Metabolic Panel Recent Labs    02/05/19 0451 02/06/19 0443  NA 138 138  K 4.0 4.1  CL 106 104  CO2 23 24  GLUCOSE 100* 107*  BUN 10 13  CREATININE 1.49* 1.20  CALCIUM 8.9 8.7*  MG 1.6* 2.0   Liver Function Tests No results for input(s): AST, ALT, ALKPHOS, BILITOT, PROT, ALBUMIN in the last 72 hours. No results for input(s): LIPASE, AMYLASE in the last 72 hours. Cardiac Enzymes No results for input(s): CKTOTAL, CKMB, CKMBINDEX,  TROPONINI in the last 72 hours.   Telemetry    Atrial fibrillation rates in 70-90s  (personally reviewed)  Radiology    No results found.  Patient Profile     Glenn [name] is a 68 y.o. male with a history of ICD for NICM, Persistent AF previously on tikosyn, CVA, HTN, HF, and Hepatitis C who is being seen today for the evaluation of AF with RVR at the request of Dr. Johnsie Cancel.  Assessment & Plan    1.  Persistent Atrial fibrillation with RVR Remains in AF this am.  Plan for TEE/DCCV at [place] for CHA2DS2VASC of at least 5    Continue IV amiodarone. Post DCCV, Tanzie Rothschild plan transition to 400 mg daily until follow up.    2. Acute on chronic systolic CHF s/p [employer] Sci single chamber ICD 2015 Suspect in setting of AF with  RVR Volume status improved with IV lasix. Now back on po.  Echo 12/07/2018 showed LVEF 20%   3. H/o recent hemorrhagic stroke 10/2018 Follow closely on pradaxa.    4. Hypomagnesemia Resolved with supp.   For questions or updates, please contact [place] Please consult [email or web address] for contact info under Cardiology/STEMI.  Signed, Glenn Friar, PA-C  02/06/2019, 8:20 AM   I have seen and examined this patient with Glenn [name].  Agree with above, note added to reflect my findings.  On exam, irregular, no murmurs, lungs clear. TEE with LA thrombus. Glenn [name] work for rate control and continue pradaxa. If he is able to be rate controlled Brance Dartt plan for discharge and have him follow up in AF clinic for discussion of outpatient cardioversion.    Glenn [name] M. Verdie Wilms MD 02/06/2019 11:22 AM

## 2019-02-06 NOTE — Progress Notes (Addendum)
Reviewed with Dr. Algis Liming  Pt also did not come in on his previously prescribed Entresto, and Spironolactone was held on admission.   Would resume his HF meds. With mild AKI, will start Entresto back at 24/26 mg BID, and will plan on increasing as needed at outpatient follow up.   He plans to observe overnight, and discharge in the am if stable with resumption of meds.   Legrand Como [address]" [place], PA-C  02/06/2019 3:46 PM

## 2019-02-06 NOTE — Progress Notes (Signed)
  Echocardiogram Echocardiogram Transesophageal has been performed.  Glenn [name] A Adolfo Granieri 02/06/2019, 8:49 AM

## 2019-02-06 NOTE — Anesthesia Preprocedure Evaluation (Signed)
Anesthesia Evaluation  Patient identified by MRN, date of birth, ID band Patient awake    Reviewed: Allergy & Precautions, NPO status , Patient's Chart, lab work & pertinent test results  Airway Mallampati: II  TM Distance: >3 FB Neck ROM: Full    Dental  (+) Dental Advisory Given, Missing   Pulmonary Current Smoker and Patient abstained from smoking.,    Pulmonary exam normal breath sounds clear to auscultation       Cardiovascular hypertension, +CHF  + dysrhythmias Atrial Fibrillation  Rhythm:Irregular Rate:Abnormal     Neuro/Psych  Headaches, CVA negative psych ROS   GI/Hepatic negative GI ROS, (+) Hepatitis -, C  Endo/Other  negative endocrine ROS  Renal/GU Renal InsufficiencyRenal disease     Musculoskeletal negative musculoskeletal ROS (+)   Abdominal   Peds  Hematology  (+) Blood dyscrasia, anemia ,   Anesthesia Other Findings Day of surgery medications reviewed with the patient.  Reproductive/Obstetrics                             Anesthesia Physical Anesthesia Plan  ASA: III  Anesthesia Plan: General   Post-op Pain Management:    Induction: Intravenous  PONV Risk Score and Plan: 1 and Treatment may vary due to age or medical condition  Airway Management Planned: Mask  Additional Equipment:   Intra-op Plan:   Post-operative Plan:   Informed Consent: I have reviewed the patients History and Physical, chart, labs and discussed the procedure including the risks, benefits and alternatives for the proposed anesthesia with the patient or authorized representative who has indicated his/her understanding and acceptance.     Dental advisory given  Plan Discussed with: CRNA  Anesthesia Plan Comments:         Anesthesia Quick Evaluation

## 2019-02-06 NOTE — Progress Notes (Signed)
CCMD called to report a 5 beat run of v-tach. Confirmed that the strip resembled v-tach. Assessed patient to find that he had just spilled cold water on his abdomen and was rubbing himself with a washcloth to dry off. He is in no acute distress and denies any symptoms. Will continue to monitor.

## 2019-02-06 NOTE — Progress Notes (Signed)
Patient left for endoscopy at 0716. Patient returned to unit at 0930; charge RN to bedside to receive patient.   Spoke with MD Hongalgi via phone at [phone], patient to be discharged on 12/19  New gauze dressing applied at 1714. Patient chole insertion dried drainage. Patient stated uses alcohol and peroxide at home to soothe site.  Paged Hongalgi at [place]. tenderness at chole drain site with dried drainage.uses alcohol & peroxide at home to soothe.drain care? Upon callback informed pt. Has tenderness at site and uses peroxide and alcohol at home, informed dried drainage at site. RN can provide site care as appropriate for comfort.

## 2019-02-07 DIAGNOSIS — Z23 Encounter for immunization: Secondary | ICD-10-CM | POA: Diagnosis not present

## 2019-02-07 DIAGNOSIS — I10 Essential (primary) hypertension: Secondary | ICD-10-CM

## 2019-02-07 DIAGNOSIS — I4891 Unspecified atrial fibrillation: Secondary | ICD-10-CM

## 2019-02-07 LAB — BASIC METABOLIC PANEL
Anion gap: 9 (ref 5–15)
BUN: 11 mg/dL (ref 8–23)
CO2: 26 mmol/L (ref 22–32)
Calcium: 9 mg/dL (ref 8.9–10.3)
Chloride: 104 mmol/L (ref 98–111)
Creatinine, Ser: 1.12 mg/dL (ref 0.61–1.24)
GFR calc Af Amer: >60 mL/min (ref >60)
GFR calc non Af Amer: >60 mL/min (ref >60)
Glucose, Bld: 94 mg/dL (ref 70–99)
Potassium: 4.2 mmol/L (ref 3.5–5.1)
Sodium: 139 mmol/L (ref 135–145)

## 2019-02-07 LAB — GLUCOSE, CAPILLARY
Glucose-Capillary: 88 mg/dL (ref 70–99)
Glucose-Capillary: 92 mg/dL (ref 70–99)

## 2019-02-07 MED ORDER — SPIRONOLACTONE 25 MG PO TABS: 25.0000 mg | ORAL_TABLET | Freq: Every day | ORAL | 0 refills | Status: DC

## 2019-02-07 MED ORDER — FUROSEMIDE 40 MG PO TABS: 40.0000 mg | ORAL_TABLET | Freq: Every day | ORAL | 0 refills | Status: DC

## 2019-02-07 MED ORDER — DABIGATRAN ETEXILATE MESYLATE 150 MG PO CAPS: 150.0000 mg | ORAL_CAPSULE | Freq: Two times a day (BID) | ORAL | 0 refills | Status: DC

## 2019-02-07 MED ORDER — PANTOPRAZOLE SODIUM 40 MG PO TBEC: 40.0000 mg | DELAYED_RELEASE_TABLET | Freq: Every day | ORAL | 0 refills | Status: DC

## 2019-02-07 MED ORDER — SACUBITRIL-VALSARTAN 24-26 MG PO TABS: 1.0000 | ORAL_TABLET | Freq: Two times a day (BID) | ORAL | 0 refills | Status: DC

## 2019-02-07 MED ORDER — PHENOL 1.4 % MT LIQD
1.0000 | OROMUCOSAL | Status: DC | PRN
  Administered 2019-02-07: 1 via OROMUCOSAL
  Filled 2019-02-07: qty 177

## 2019-02-07 MED ORDER — ATORVASTATIN CALCIUM 20 MG PO TABS: 20.0000 mg | ORAL_TABLET | Freq: Every day | ORAL | 0 refills | Status: DC

## 2019-02-07 MED ORDER — POTASSIUM CHLORIDE CRYS ER 20 MEQ PO TBCR: 20.0000 meq | EXTENDED_RELEASE_TABLET | Freq: Every day | ORAL | 0 refills | Status: DC

## 2019-02-07 MED ORDER — DIGOXIN 250 MCG PO TABS: 0.2500 mg | ORAL_TABLET | Freq: Every day | ORAL | 0 refills | Status: DC

## 2019-02-07 MED ORDER — METOPROLOL SUCCINATE ER 100 MG PO TB24: 100.0000 mg | ORAL_TABLET | Freq: Every day | ORAL | 0 refills | Status: DC

## 2019-02-07 NOTE — TOC Transition Note (Addendum)
Transition of Care [facility]) - CM/SW Discharge Note   Patient Details  Name: Glenn [name] MRN: [id number] Date of Birth: 06-27-1950  Transition of Care [facility]) CM/SW Contact:  Carles Collet, RN Phone Number: 02/07/2019, 12:54 PM   Clinical Narrative:    Notified [facility] liaison that patient will DC today. Patient verified equipment for home is in room. No other CM needs identified.  14:15 notified by [place] that patient os active w [employer], their liaison was updated that patient will DC today and will resume services.      Final next level of care: [place] Barriers to Discharge: Continued Medical Work up   Patient Goals and CMS Choice Patient states their goals for this hospitalization and ongoing recovery are:: get better CMS Medicare.gov Compare Post Acute Care list provided to:: Patient Choice offered to / list presented to : Patient  Discharge Placement                       Discharge Plan and Services   Discharge Planning Services: CM Consult Post Acute Care Choice: Home Health, Durable Medical Equipment          DME Arranged: [place] rolling DME Agency: [employer] Date DME Agency Contacted: 02/05/19 Time DME Agency Contacted: [id number] Representative spoke with at DME Agency: zack HH Arranged: PT, RN, Disease Management [place] Agency: [facility] (formerly [employer]) Date [place]: 02/07/19 Time [place]: [place] Representative spoke with at [place]: [place] ([place]) Interventions     Readmission Risk Interventions Readmission Risk Prevention Plan 02/05/2019  Transportation Screening Complete  Medication Review Press photographer) Complete  PCP or Specialist appointment within 3-5 days of discharge Complete  HRI or [place] Complete  SW Recovery Care/Counseling Consult Complete  [place] Not Applicable

## 2019-02-07 NOTE — Discharge Instructions (Signed)

## 2019-02-07 NOTE — Progress Notes (Signed)
ANTICOAGULATION CONSULT NOTE - Initial Consult  Pharmacy Consult for Pradax Indication: afib and h/o DVT   Allergies  Allergen Reactions  . Benadryl [Diphenhydramine] Palpitations    Patient Measurements: Height: 5\' 7"  (170.2 cm) Weight: 172 lb 14.4 oz (78.4 kg) IBW/kg (Calculated) : 66.1  Vital Signs: Temp: 97.9 F (36.6 C) (12/19 0428) Temp Source: Oral (12/19 0428) BP: 112/64 (12/19 0816) Pulse Rate: 82 (12/19 0816)  Labs: Recent Labs    02/05/19 0451 02/06/19 0443 02/07/19 0324  HGB 11.0*  --   --   HCT 34.1*  --   --   PLT 164  --   --   LABPROT  --  19.3*  --   INR  --  1.6*  --   CREATININE 1.49* 1.20 1.12    Estimated Creatinine Clearance: 60.7 mL/min (by C-G formula based on SCr of 1.12 mg/dL).   Medical History: Past Medical History:  Diagnosis Date  . Atrial fibrillation ([place])   . CHF (congestive heart failure) ([place])   . Hepatitis C   . Hypertension   . Stroke ([place])   . Visit for monitoring Tikosyn therapy 03/26/2017    Assessment: [name] PTA for Afib and hx DVT in Oct 2020. Continued inpt. CHADS2-VASc 5. Hx of hemorrhagic CVA in 2020.   Renal function improving, Cr today is 1.12 with estimated CrCl ~60 ml/min. H&H is stable at 11/34.1, plts are at lower end of normal limits at 164.   Goal of Therapy:  Therapeutic oral anticoagulation   Plan:  Continue pradaxa 150 q12h  Monitor for any signs of bleeding    Thank you,   Eddie Candle, PharmD PGY-1 Pharmacy Resident   Please check amion for clinical pharmacist contact number

## 2019-02-07 NOTE — Discharge Summary (Signed)
Physician Discharge Summary  Glenn [name] [id number] DOB: 1951/02/15  PCP: Glenn Dawn, MD  Admitted from: Home Discharged to: Home  Admit date: 02/03/2019 Discharge date: 02/07/2019  Recommendations for Outpatient Follow-up:   Follow-up Information    Glenn Friar, PA-C Follow up on 03/23/2019.   Specialty: Physician Assistant Why: at 1005 for post hospital follow up for Atrial fib Contact information: [place] [address] [phone]        Glenn [name] Follow up on 02/17/2019.   Specialty: Cardiology Why: at 230pm with Glenn [name] parking code is [phone].  Contact information: [address] [id number] [place] [name] Follow up.   Why: rolling walker [name] Follow up.   Specialty: Home Health Services Why:  [facility], HHPT Contact information: [address] [name] [address] [phone]        Glenn Dawn, MD. Schedule an appointment as soon as possible for a visit in 1 week.   Specialty: Family Medicine Why: To be seen with repeat labs (CBC, CMP & digoxin level).  Do not need to repeat these labs if they are done by the Advanced Heart Failure clinic. Contact information: [address] [address] [phone]        Glenn Sprang, MD.   Specialty: Cardiology Contact information: [phone] [place] [address] [phone]        Glenn [name] Follow up on 02/11/2019.   Specialty: Radiology Why: Keep previous appointment that you have for 10 AM.  Please arrive by 9:45 AM. Contact information: [address] [id number] [place] [name] [phone]         Patient has a prior follow-up appointment with interventional radiology on 02/11/2019 at 30 AM  regarding follow-up of percutaneous cholecystostomy tube.  Patient and his son have been advised to keep up this appointment.  Home Health: PT and RN Equipment/Devices: Rolling walker with 5 inch wheels  Discharge Condition: Improved and stable CODE STATUS: Full Diet recommendation: Heart healthy diet.  Discharge Diagnoses:  Principal Problem:   Atrial fibrillation with RVR ([name]) Active Problems:   Acute on chronic systolic CHF (congestive heart failure) ([name])   HTN (hypertension)   History of hemorrhagic stroke with residual hemiparesis ([name])   Left leg DVT ([name])   Cholecystitis   Brief Summary: 69 year old male with PMH of persistent A. fib, nonischemic cardiomyopathy, chronic systolic CHF with LVEF 123456, ICD, hemorrhagic infarct/CVA 10/2018 following which Eliquis was discontinued and then [name] resumed 12/23/2018, essential hypertension, hepatitis C, left leg DVT s/p IVC filter 12/04/2018, cholecystitis with percutaneous cholecystostomy tube in place, presented to [facility] ED on 02/03/2019 due to dyspnea and worsening leg swelling.  Reportedly been out of Lasix since returning home from SNF 1 to 2 weeks ago and also?  Not on anticoagulants.  Admitted for A. fib with RVR and acute on chronic systolic CHF.  Cardiology consulted.  Treated with IV heparin drip, IV amiodarone, digoxin with load.  TEE 12/18 showed LAA thrombus and hence cardioversion was not performed.   Assessment & Plan:   Persistent A. fib with RVR.  Has failed DCCV in the past, had been on Tikosyn stopped due to prolonged QTC.  Presented with RVR in the 160s.  In ED was given diltiazem bolus  and briefly on IV Cardizem infusion.  However due to low EF, changed to IV amiodarone.  CHADS-VASc at least 3 (age, CHF, HTN) and he has LLE DVT; will continue [name].  [name] reportedly resumed by Neurology in November.  Cardiology input appreciated, currently on Toprol-XL 100 mg daily, and digoxin 0.25 mg daily after IV  load.    Serum digoxin level 1.1 on 12/17.  Controlled ventricular rate now.  TEE 12/18 showed LAA thrombus and hence cardioversion was not performed.  As per discussion with Cardiology and their follow-up yesterday, patient to continue Toprol-XL, digoxin and [name].  They have arranged close outpatient follow-up.  As per my discussion with patient's son, patient reportedly only has following medications at home: Gabapentin, potassium, spironolactone and furosemide.  Provided prescription for all his cardiac medications.  Improved and stable.  Acute on chronic systolic CHF/nonischemic cardiomyopathy/ICD  Last EF 20%.  Last cardiac cath 11/03/2018 showed patent coronaries.    Reportedly not discharged on Lasix after recent SNF admission.  Unsure if rapid A. fib precipitated heart failure or other way around.  Cardiology input appreciated.  Was on IV Lasix 40 mg twice daily.  Clinically appears euvolemic.  Creatinine has gone up to 1.4.  Cardiology have switched Lasix to 40 mg p.o. daily.  Creatinine has improved to 1.2.  Appears compensated now.  Acute kidney injury  Creatinine has improved from 1.49-1.2.  Was likely due to aggressive IV diuresis.  Resolved.  History of hemorrhagic CVA 10/2018  Residual mild left hemiparesis.  PT recommends home health PT at discharge.  Anticoagulation/Eliquis was held and as per neurology, [name] started on 12/23/2018 but reportedly patient not discharged from [facility] with [name].  Continue [name].  Left lower extremity and left upper extremity DVT  S/p IVC filter placement.  Back on anticoagulation/[name].  Acute cholecystitis s/p percutaneous cholecystostomy  Reported some abdominal pain in ED.  Hence RUQ ultrasound obtained which shows cholecystostomy tube decompressing the gallbladder.  Multiple gallstones with gallbladder wall thickening and small amount of pericholecystic fluid.  Positive sonographic Percell Miller sign but unable  to elicit same on exam.  Other than trace pericholecystic fluid, sonographic findings unchanged from 12/20/2018.  No biliary dilatation.  Percutaneous cholecystostomy placed by IR on 10/24 with plans to remain for 8 weeks.  Patient has been asymptomatic in the hospital of abdominal pain or other GI symptoms.  Abdomen without acute findings.  I discussed with IR MD on call today and confirmed that patient has follow-up with them on 12/23 for management of the percutaneous cholecystostomy tube.  I confirmed with patient and his son and advised them to keep the follow-up appointment.  Hypomagnesemia  Replaced  Anemia, suspect chronic disease  Stable.  Essential hypertension  Controlled.  NSVT  Potassium and Magnesium okay as noted below.  Continue Toprol-XL.   Consultants:   Cardiology EP cardiology  Procedures:   Patient has right percutaneous cholecystostomy drain, present prior to admission. TEE 12/18:  Brief TEE Note  LVEF 15-20%. Global hypokinesis RV systolic function mildly reduced  Mild MR and PR. Moderate AR Moderate to severe TR Small mobile thrombus in the left atrial appendage tip No vegetation or thrombus noted on PPM leads  Cardioversion was not performed due to LAA thrombus.    Discharge Instructions  Discharge Instructions    (HEART FAILURE PATIENTS) Call MD:  Anytime you have any of the following symptoms: 1) 3 pound weight gain in 24 hours or 5 pounds in 1 week 2) shortness  of breath, with or without a dry hacking cough 3) swelling in the hands, feet or stomach 4) if you have to sleep on extra pillows at night in order to breathe.   Complete by: As directed    Amb referral to AFIB Clinic   Complete by: As directed    Call MD for:  difficulty breathing, headache or visual disturbances   Complete by: As directed    Call MD for:  extreme fatigue   Complete by: As directed    Call MD for:  persistant dizziness or light-headedness    Complete by: As directed    Call MD for:  persistant nausea and vomiting   Complete by: As directed    Call MD for:  redness, tenderness, or signs of infection (pain, swelling, redness, odor or green/yellow discharge around incision site)   Complete by: As directed    Call MD for:  severe uncontrolled pain   Complete by: As directed    Call MD for:  temperature >100.4   Complete by: As directed    Diet - low sodium heart healthy   Complete by: As directed    Increase activity slowly   Complete by: As directed        Medication List    STOP taking these medications   feeding supplement (ENSURE ENLIVE) Liqd   lidocaine 5 % Commonly known as: LIDODERM     TAKE these medications   acetaminophen 325 MG tablet Commonly known as: TYLENOL Take 2 tablets (650 mg total) by mouth every 4 (four) hours as needed for mild pain (or temp > 37.5 C (99.5 F)).   atorvastatin 20 MG tablet Commonly known as: LIPITOR Take 1 tablet (20 mg total) by mouth daily at 6 PM.   calcium carbonate 500 MG chewable tablet Commonly known as: TUMS - dosed in mg elemental calcium Chew 1 tablet (200 mg of elemental calcium total) by mouth daily as needed for indigestion or heartburn.   dabigatran 150 MG Caps capsule Commonly known as: [name] Take 1 capsule (150 mg total) by mouth 2 (two) times daily. What changed: when to take this   digoxin 0.25 MG tablet Commonly known as: LANOXIN Take 1 tablet (0.25 mg total) by mouth daily. Start taking on: February 08, 2019   furosemide 40 MG tablet Commonly known as: LASIX Take 1 tablet (40 mg total) by mouth daily.   gabapentin 300 MG capsule Commonly known as: NEURONTIN Take 1 capsule (300 mg total) by mouth every 8 (eight) hours.   ipratropium 0.02 % nebulizer solution Commonly known as: ATROVENT Take 2.5 mLs (0.5 mg total) by nebulization every 6 (six) hours as needed for wheezing or shortness of breath.   levalbuterol 0.63 MG/3ML nebulizer  solution Commonly known as: XOPENEX Take 3 mLs (0.63 mg total) by nebulization every 6 (six) hours as needed for wheezing or shortness of breath.   metoprolol succinate 100 MG 24 hr tablet Commonly known as: TOPROL-XL Take 1 tablet (100 mg total) by mouth daily. Take with or immediately following a meal.   pantoprazole 40 MG tablet Commonly known as: PROTONIX Take 1 tablet (40 mg total) by mouth daily.   potassium chloride SA 20 MEQ tablet Commonly known as: KLOR-CON Take 1 tablet (20 mEq total) by mouth daily.   sacubitril-valsartan 24-26 MG Commonly known as: ENTRESTO Take 1 tablet by mouth 2 (two) times daily.   spironolactone 25 MG tablet Commonly known as: ALDACTONE Take 1 tablet (25 mg total)  by mouth daily.   traMADol 50 MG tablet Commonly known as: ULTRAM Take 1 tablet (50 mg total) by mouth every 8 (eight) hours as needed for severe pain.      Allergies  Allergen Reactions  . Benadryl [Diphenhydramine] Palpitations      Procedures/Studies: DG Chest 2 View  Result Date: 02/03/2019 CLINICAL DATA:  Shortness of breath EXAM: CHEST - 2 VIEW COMPARISON:  12/09/2018 FINDINGS: Left AICD. Cardiomegaly with vascular congestion. Airspace disease noted in the lower lobes bilaterally, right greater than left. This is slightly increased at the left base, similar on the right since prior study. Suspect small right pleural effusion. No acute bony abnormality. IMPRESSION: Persistent right basilar infiltrate. Increasing left basilar atelectasis or infiltrate. Cardiomegaly. Electronically Signed   By: Rolm Baptise M.D.   On: 02/03/2019 19:26   ECHO TEE  Result Date: 02/06/2019   TRANSESOPHOGEAL ECHO REPORT   Patient Name:   Glenn [name] Date of Exam: 02/06/2019 Medical Rec #:  [id number]           Height:       67.0 in Accession #:    [id number]          Weight:       172.4 lb Date of Birth:  08-Jul-1950           BSA:          1.90 m Patient Age:    36 years            BP:            148/90 mmHg Patient Gender: M                   HR:           64 bpm. Exam Location:  Inpatient  Procedure: Transesophageal Echo Indications:     Atrial Fibrillation  History:         Patient has prior history of Echocardiogram examinations, most                  recent 12/07/2018. DCM (dilated cardiomyopathy)                  AKI.  Sonographer:     Vikki Ports Turrentine Referring Phys:  [id number] Glenn [name] Diagnosing Phys: Skeet Latch MD  PROCEDURE: Consent was requested emergently by emergency room physicain. The transesophogeal probe was passed through the esophogus of the patient. The patient's vital signs; including heart rate, blood pressure, and oxygen saturation; remained stable throughout the procedure. The patient developed no complications during the procedure. IMPRESSIONS  1. Left ventricular ejection fraction, by visual estimation, is 15-20%. The left ventricle has severely decreased function. There is no left ventricular hypertrophy.  2. Mildly dilated left ventricular internal cavity size.  3. The left ventricle demonstrates global hypokinesis.  4. Global right ventricle has moderately reduced systolic function.The right ventricular size is normal. No increase in right ventricular wall thickness.  5. Left atrial size was normal.  6. Moderate sized left atrial thrombus.  7. LA appendage thrombus 1.24 cm x 0.75 cm. Smoke noted in the LA appendage.  8. Right atrial size was normal.  9. The mitral valve is normal in structure. Mild mitral valve regurgitation. No evidence of mitral stenosis. 10. The tricuspid valve is normal in structure. Tricuspid valve regurgitation moderate-severe. 11. Aortic valve regurgitation is moderate. 12. The aortic valve is normal in structure. Aortic valve regurgitation is moderate. No evidence of  aortic valve sclerosis or stenosis. 13. The pulmonic valve was normal in structure. Pulmonic valve regurgitation is mild. 14. Minimal plaque invoving the  descending aorta. 15. Moderately elevated pulmonary artery systolic pressure. 16. A pacer wire is visualized. 17. The inferior vena cava is normal in size with greater than 50% respiratory variability, suggesting right atrial pressure of 3 mmHg. FINDINGS  Left Ventricle: Left ventricular ejection fraction, by visual estimation, is 15-20%. The left ventricle has severely decreased function. The left ventricle demonstrates global hypokinesis. The left ventricular internal cavity size was mildly dilated left ventricle. There is no left ventricular hypertrophy. Normal left atrial pressure. Right Ventricle: The right ventricular size is normal. No increase in right ventricular wall thickness. Global RV systolic function is has moderately reduced systolic function. The tricuspid regurgitant velocity is 3.23 m/s, and with an assumed right atrial pressure of 10 mmHg, the estimated right ventricular systolic pressure is moderately elevated at 51.7 mmHg. Left Atrium: Left atrial size was normal in size. There is a moderate sized left atrial thrombus seen. LA appendage thrombus 1.24 cm x 0.75 cm. Smoke noted in the LA appendage. Right Atrium: Right atrial size was normal in size Prominent Eustachian valve. Pericardium: There is no evidence of pericardial effusion. Mitral Valve: The mitral valve is normal in structure. No evidence of mitral valve stenosis by observation. Mild mitral valve regurgitation. Tricuspid Valve: The tricuspid valve is normal in structure. Tricuspid valve regurgitation moderate-severe. Aortic Valve: The aortic valve is normal in structure. Aortic valve regurgitation is moderate. Aortic regurgitation PHT measures 308 msec. The aortic valve is structurally normal, with no evidence of sclerosis or stenosis. Pulmonic Valve: The pulmonic valve was normal in structure. Pulmonic valve regurgitation is mild. Aorta: The aortic root, ascending aorta and aortic arch are all structurally normal, with no evidence of  dilitation or obstruction. There is minimal, atheroma plaque involving the descending aorta. Venous: The inferior vena cava is normal in size with greater than 50% respiratory variability, suggesting right atrial pressure of 3 mmHg. Shunts: There is no evidence of a patent foramen ovale. No ventricular septal defect is seen or detected. There is no evidence of an atrial septal defect. No atrial level shunt detected by color flow Doppler. Additional Comments: A pacer wire is visualized.  AORTIC VALVE             Normals LVOT Vmax:   66.10 cm/s LVOT Vmean:  41.200 cm/s 75 cm/s LVOT VTI:    0.078 m     25.3 cm AI PHT:      308 msec TRICUSPID VALVE             Normals TR Peak grad:   41.7 mmHg TR Vmax:        323.00 cm/s 288 cm/s  SHUNTS Systemic VTI: 0.08 m  Skeet Latch MD Electronically signed by Skeet Latch MD Signature Date/Time: 02/06/2019/9:17:42 AM    Final    US Abdomen Limited RUQ  Result Date: 02/04/2019 CLINICAL DATA:  Right upper quadrant pain. Cholecystitis. Cholecystostomy tube. EXAM: ULTRASOUND ABDOMEN LIMITED RIGHT UPPER QUADRANT COMPARISON:  Ultrasound 12/20/2018. FINDINGS: Gallbladder: Gallbladder is decompressed. Multiple shadowing gallstones. Cholecystostomy tube is visualized extending into the decompressed gallbladder. Gallbladder wall thickening of 4 mm. Small amount of pericholecystic fluid. Positive sonographic Murphy sign noted by sonographer. Common bile duct: Diameter: 5 mm. Liver: No focal lesion identified. Within normal limits in parenchymal echogenicity. Portal vein is patent on color Doppler imaging with normal direction of blood flow towards the  liver. Other: None. IMPRESSION: 1. Cholecystostomy tube decompressing the gallbladder. Multiple gallstones with gallbladder wall thickening and small amount pericholecystic fluid. There is a positive sonographic Murphy sign. Other than trace pericholecystic fluid, sonographic findings are unchanged from 12/20/2018. 2. No biliary  dilatation. Electronically Signed   By: Szymon Rake M.D.   On: 02/04/2019 03:04      Subjective: Patient denies complaints.  No chest pain, dyspnea, palpitations or dizziness.  Denies nausea, vomiting, abdominal pain or issues with his abdominal tube.  Discharge Exam:  Vitals:   02/07/19 0121 02/07/19 0428 02/07/19 0816 02/07/19 1131  BP: 111/64 136/79 112/64 116/82  Pulse: 70 78 82 71  Resp: 20 20 18 16   Temp: 97.6 F (36.4 C) 97.9 F (36.6 C)  98.2 F (36.8 C)  TempSrc: Oral Oral  Oral  SpO2: 96% 96% 100% 100%  Weight: 78.4 kg     Height:        General exam: Pleasant middle-age male, moderately built and nourished lying comfortably propped up in bed without distress. Respiratory system: Clear to auscultation.  No increased work of breathing. Cardiovascular system: S1 and S2 heard, RRR.  No JVD, murmurs or pedal edema.  Telemetry personally reviewed: A. fib with controlled ventricular rate.  Occasional V paced PVCs.  5 beat NSVT. Gastrointestinal system: Abdomen is nondistended, soft and nontender today. No organomegaly or masses felt. Normal bowel sounds heard.  RUQ percutaneous cholecystostomy tube intact and without any acute findings.  Small amount of biliary fluid noted in tube. Central nervous system: Alert and oriented. No focal neurological deficits. Extremities: Left leg mildly asymmetrically swollen compared to right without acute findings, this is reportedly chronic.  Mild left hemiparesis, grade 4 x 5 power in left extremities. Skin: No rashes, lesions or ulcers Psychiatry: Judgement and insight appear normal. Mood & affect appropriate.    The results of significant diagnostics from this hospitalization (including imaging, microbiology, ancillary and laboratory) are listed below for reference.     Microbiology: Recent Results (from the past 240 hour(s))  SARS CORONAVIRUS 2 (TAT 6-24 HRS) Nasopharyngeal Nasopharyngeal Swab     Status: None   Collection Time:  02/03/19  9:19 PM   Specimen: Nasopharyngeal Swab  Result Value Ref Range Status   SARS Coronavirus 2 NEGATIVE NEGATIVE Final    Comment: (NOTE) SARS-CoV-2 target nucleic acids are NOT DETECTED. The SARS-CoV-2 RNA is generally detectable in upper and lower respiratory specimens during the acute phase of infection. Negative results do not preclude SARS-CoV-2 infection, do not rule out co-infections with other pathogens, and should not be used as the sole basis for treatment or other patient management decisions. Negative results must be combined with clinical observations, patient history, and epidemiological information. The expected result is Negative. Fact Sheet for Patients: SugarRoll.be Fact Sheet for Healthcare Providers: https://www.woods-mathews.com/ This test is not yet approved or cleared by the [place] FDA and  has been authorized for detection and/or diagnosis of SARS-CoV-2 by FDA under an Emergency Use Authorization (EUA). This EUA will remain  in effect (meaning this test can be used) for the duration of the COVID-19 declaration under Section 56 4(b)(1) of the Act, 21 U.S.C. section 360bbb-3(b)(1), unless the authorization is terminated or revoked sooner. Performed at [facility], [place] [address], [place], [place] [address]      Labs: CBC: Recent Labs  Lab 02/03/19 1902 02/05/19 0451  WBC 4.7 4.9  HGB 10.9* 11.0*  HCT 33.0* 34.1*  MCV 92.2 93.2  PLT 173 [id number]    Basic Metabolic Panel: Recent Labs  Lab 02/03/19 1902 02/03/19 2006 02/04/19 0359 02/04/19 1312 02/05/19 0451 02/06/19 0443 02/07/19 0324  NA 140  --   --   --  138 138 139  K 3.7  --   --   --  4.0 4.1 4.2  CL 108  --   --   --  106 104 104  CO2 20*  --   --   --  23 24 26   GLUCOSE 113*  --   --   --  100* 107* 94  BUN 10  --   --   --  10 13 11   CREATININE 1.09  --   --   --  1.49* 1.20 1.12  CALCIUM 9.5  --   --   --  8.9 8.7* 9.0   MG  --  1.6* 1.5* 1.8 1.6* 2.0  --     Liver Function Tests: No results for input(s): AST, ALT, ALKPHOS, BILITOT, PROT, ALBUMIN in the last 168 hours.  CBG: Recent Labs  Lab 02/06/19 0637 02/06/19 1155 02/06/19 1658 02/06/19 2122 02/07/19 0629  GLUCAP 106* 97 87 112* 88   I discussed in detail with patient's son via phone, updated care and answered all questions.   Time coordinating discharge: 45 minutes  SIGNED:  Vernell Leep, MD, [place], [facility]. [facility]  To contact the attending provider between 7A-7P or the covering provider during after hours 7P-7A, please log into the web site [email or web address] and access using universal [place] password for that web site. If you do not have the password, please call the hospital operator.

## 2019-02-07 NOTE — Progress Notes (Signed)
Chart reviewed  No active ep issues.  Plans for discharge noted.  If any issues related to his HF meds, would have general cardiology see.  Dr Johnsie Cancel has seen previously this admission.  Electrophysiology team to see as needed while here. Please call with questions.  Thompson Grayer MD, [facility] 02/07/2019 7:32 AM

## 2019-02-09 DIAGNOSIS — I82401 Acute embolism and thrombosis of unspecified deep veins of right lower extremity: Secondary | ICD-10-CM | POA: Diagnosis not present

## 2019-02-09 DIAGNOSIS — I5022 Chronic systolic (congestive) heart failure: Secondary | ICD-10-CM | POA: Diagnosis not present

## 2019-02-09 DIAGNOSIS — I4891 Unspecified atrial fibrillation: Secondary | ICD-10-CM | POA: Diagnosis not present

## 2019-02-09 DIAGNOSIS — I69154 Hemiplegia and hemiparesis following nontraumatic intracerebral hemorrhage affecting left non-dominant side: Secondary | ICD-10-CM | POA: Diagnosis not present

## 2019-02-09 DIAGNOSIS — I69191 Dysphagia following nontraumatic intracerebral hemorrhage: Secondary | ICD-10-CM | POA: Diagnosis not present

## 2019-02-09 DIAGNOSIS — R1310 Dysphagia, unspecified: Secondary | ICD-10-CM | POA: Diagnosis not present

## 2019-02-09 DIAGNOSIS — J690 Pneumonitis due to inhalation of food and vomit: Secondary | ICD-10-CM | POA: Diagnosis not present

## 2019-02-09 DIAGNOSIS — I82402 Acute embolism and thrombosis of unspecified deep veins of left lower extremity: Secondary | ICD-10-CM | POA: Diagnosis not present

## 2019-02-09 DIAGNOSIS — I130 Hypertensive heart and chronic kidney disease with heart failure and stage 1 through stage 4 chronic kidney disease, or unspecified chronic kidney disease: Secondary | ICD-10-CM | POA: Diagnosis not present

## 2019-02-09 LAB — CUP PACEART REMOTE DEVICE CHECK
Battery Remaining Longevity: 18 mo
Battery Remaining Percentage: 38 %
Brady Statistic RV Percent Paced: 2 %
Date Time Interrogation Session: 20201219152400
HighPow Impedance: 35 Ohm
Implantable Lead Implant Date: 20151113
Implantable Lead Location: 753860
Implantable Lead Model: 295
Implantable Lead Serial Number: 135220
Implantable Pulse Generator Implant Date: 20151113
Lead Channel Impedance Value: 653 Ohm
Lead Channel Pacing Threshold Amplitude: 0.6 V
Lead Channel Pacing Threshold Pulse Width: 0.5 ms
Lead Channel Setting Pacing Amplitude: 2.5 V
Lead Channel Setting Pacing Pulse Width: 0.5 ms
Lead Channel Setting Sensing Sensitivity: 0.5 mV
Pulse Gen Serial Number: 104417

## 2019-02-10 ENCOUNTER — Telehealth: Payer: Self-pay | Admitting: Internal Medicine

## 2019-02-10 NOTE — Telephone Encounter (Signed)
Debbie from [facility] calling back. She says to leave a message if she does not pick up.

## 2019-02-10 NOTE — Telephone Encounter (Signed)
Left message to call back  

## 2019-02-10 NOTE — Telephone Encounter (Signed)
Left detailed message on Glenn [name]'s VM letting her know that [facility] orders are typically managed by PCP. Let her know that Dr. [name] will not be back in the office until next week to review request.

## 2019-02-10 NOTE — Telephone Encounter (Signed)
Debbie from [facility] calling requesting an order for a social worker to visit the patient. She is also requesting that he resume nursing and physical therapy.

## 2019-02-11 ENCOUNTER — Ambulatory Visit (HOSPITAL_COMMUNITY)
Admission: RE | Admit: 2019-02-11 | Discharge: 2019-02-11 | Disposition: A | Discharge status: Home / Self Care | Payer: Medicare HMO | Source: Ambulatory Visit | Attending: Radiology | Admitting: Radiology

## 2019-02-11 ENCOUNTER — Other Ambulatory Visit (HOSPITAL_COMMUNITY): Payer: Self-pay | Admitting: Radiology

## 2019-02-11 ENCOUNTER — Other Ambulatory Visit: Payer: Self-pay

## 2019-02-11 ENCOUNTER — Other Ambulatory Visit: Payer: Self-pay | Admitting: Family Medicine

## 2019-02-11 DIAGNOSIS — K810 Acute cholecystitis: Secondary | ICD-10-CM

## 2019-02-11 DIAGNOSIS — Z4803 Encounter for change or removal of drains: Secondary | ICD-10-CM | POA: Insufficient documentation

## 2019-02-11 DIAGNOSIS — K8020 Calculus of gallbladder without cholecystitis without obstruction: Secondary | ICD-10-CM | POA: Insufficient documentation

## 2019-02-11 DIAGNOSIS — Z4659 Encounter for fitting and adjustment of other gastrointestinal appliance and device: Secondary | ICD-10-CM | POA: Diagnosis not present

## 2019-02-11 DIAGNOSIS — K8000 Calculus of gallbladder with acute cholecystitis without obstruction: Secondary | ICD-10-CM | POA: Diagnosis not present

## 2019-02-11 HISTORY — PX: IR EXCHANGE BILIARY DRAIN: IMG6046

## 2019-02-11 MED ORDER — LIDOCAINE HCL 1 % IJ SOLN
INTRAMUSCULAR | Status: AC
  Filled 2019-02-11: qty 20

## 2019-02-11 MED ORDER — IOHEXOL 300 MG/ML  SOLN
50.0000 mL | Freq: Once | INTRAMUSCULAR | Status: AC | PRN
  Administered 2019-02-11: 5 mL

## 2019-02-11 MED ORDER — LIDOCAINE HCL (PF) 1 % IJ SOLN
INTRAMUSCULAR | Status: AC | PRN
  Administered 2019-02-11: 10 mL

## 2019-02-14 NOTE — Telephone Encounter (Signed)
Noted  

## 2019-02-16 NOTE — Telephone Encounter (Signed)
Left detailed message for Debbie to defer orders to PCP.

## 2019-02-17 ENCOUNTER — Ambulatory Visit (HOSPITAL_COMMUNITY)
Admit: 2019-02-17 | Discharge: 2019-02-17 | Disposition: A | Discharge status: Home / Self Care | Payer: Medicare HMO | Source: Ambulatory Visit | Attending: Nurse Practitioner | Admitting: Nurse Practitioner

## 2019-02-17 ENCOUNTER — Other Ambulatory Visit: Payer: Self-pay

## 2019-02-17 ENCOUNTER — Encounter (HOSPITAL_COMMUNITY): Payer: Self-pay | Admitting: Nurse Practitioner

## 2019-02-17 VITALS — BP 124/60 | HR 85 | Ht 67.0 in | Wt 171.4 lb

## 2019-02-17 DIAGNOSIS — Z86718 Personal history of other venous thrombosis and embolism: Secondary | ICD-10-CM | POA: Insufficient documentation

## 2019-02-17 DIAGNOSIS — Z7901 Long term (current) use of anticoagulants: Secondary | ICD-10-CM | POA: Insufficient documentation

## 2019-02-17 DIAGNOSIS — I5022 Chronic systolic (congestive) heart failure: Secondary | ICD-10-CM | POA: Insufficient documentation

## 2019-02-17 DIAGNOSIS — I4819 Other persistent atrial fibrillation: Secondary | ICD-10-CM

## 2019-02-17 DIAGNOSIS — D6869 Other thrombophilia: Secondary | ICD-10-CM | POA: Diagnosis not present

## 2019-02-17 DIAGNOSIS — Z79899 Other long term (current) drug therapy: Secondary | ICD-10-CM | POA: Insufficient documentation

## 2019-02-17 DIAGNOSIS — Z8249 Family history of ischemic heart disease and other diseases of the circulatory system: Secondary | ICD-10-CM | POA: Diagnosis not present

## 2019-02-17 DIAGNOSIS — I110 Hypertensive heart disease with heart failure: Secondary | ICD-10-CM | POA: Insufficient documentation

## 2019-02-17 DIAGNOSIS — Z87891 Personal history of nicotine dependence: Secondary | ICD-10-CM | POA: Insufficient documentation

## 2019-02-17 DIAGNOSIS — I428 Other cardiomyopathies: Secondary | ICD-10-CM | POA: Insufficient documentation

## 2019-02-17 DIAGNOSIS — Z888 Allergy status to other drugs, medicaments and biological substances status: Secondary | ICD-10-CM | POA: Diagnosis not present

## 2019-02-17 DIAGNOSIS — Z8673 Personal history of transient ischemic attack (TIA), and cerebral infarction without residual deficits: Secondary | ICD-10-CM | POA: Insufficient documentation

## 2019-02-17 DIAGNOSIS — Z95810 Presence of automatic (implantable) cardiac defibrillator: Secondary | ICD-10-CM | POA: Insufficient documentation

## 2019-02-17 LAB — CBC
HCT: 40.5 % (ref 39.0–52.0)
Hemoglobin: 12.8 g/dL — ABNORMAL LOW (ref 13.0–17.0)
MCH: 30 pg (ref 26.0–34.0)
MCHC: 31.6 g/dL (ref 30.0–36.0)
MCV: 94.8 fL (ref 80.0–100.0)
Platelets: 317 10*3/uL (ref 150–400)
RBC: 4.27 MIL/uL (ref 4.22–5.81)
RDW: 14.8 % (ref 11.5–15.5)
WBC: 4.2 10*3/uL (ref 4.0–10.5)
nRBC: 0 % (ref 0.0–0.2)

## 2019-02-17 LAB — COMPREHENSIVE METABOLIC PANEL
ALT: 16 U/L (ref 0–44)
AST: 26 U/L (ref 15–41)
Albumin: 3.8 g/dL (ref 3.5–5.0)
Alkaline Phosphatase: 69 U/L (ref 38–126)
Anion gap: 9 (ref 5–15)
BUN: 14 mg/dL (ref 8–23)
CO2: 24 mmol/L (ref 22–32)
Calcium: 9.8 mg/dL (ref 8.9–10.3)
Chloride: 108 mmol/L (ref 98–111)
Creatinine, Ser: 1.51 mg/dL — ABNORMAL HIGH (ref 0.61–1.24)
GFR calc Af Amer: 55 mL/min — ABNORMAL LOW (ref >60)
GFR calc non Af Amer: 47 mL/min — ABNORMAL LOW (ref >60)
Glucose, Bld: 109 mg/dL — ABNORMAL HIGH (ref 70–99)
Potassium: 4.6 mmol/L (ref 3.5–5.1)
Sodium: 141 mmol/L (ref 135–145)
Total Bilirubin: 0.7 mg/dL (ref 0.3–1.2)
Total Protein: 7.5 g/dL (ref 6.5–8.1)

## 2019-02-17 LAB — DIGOXIN LEVEL: Digoxin Level: 1.9 ng/mL (ref 0.8–2.0)

## 2019-02-17 NOTE — Progress Notes (Signed)
Primary Care Physician: [name], MD Referring Physician: [facility] f/u  EP: Dr. [name] is a 68 y.o. male with a h/o persistent A. fib, nonischemic cardiomyopathy, chronic systolic CHF with LVEF 123456, ICD, hemorrhagic infarct/CVA 10/2018 following which Eliquis was discontinued and then [name] resumed 12/23/2018, essential hypertension, hepatitis C, left leg DVT s/p IVC filter 12/04/2018, cholecystitis with percutaneous cholecystostomy tube in place, presented to [facility] ED on 02/03/2019 due to dyspnea and worsening leg swelling. Reportedly been out of Lasix since returning home from SNF 1 to 2 weeks ago and also? Not on anticoagulants. Admitted for A. fib with RVR and acute on chronic systolic CHF. Cardiology consulted.Treated with IV heparin drip, IV amiodarone, digoxin with load. TEE 12/18 showed LAA thrombus and hence cardioversion was not performed. previously on Tikosyn which was stopped at an earlier hospitalization this year for qt prolongation.  He is in the afib clinic for f/u. He appears to be normovolemic. He is in rate controlled afib. Continues with  percutaneous cholecystostomy tube in place. He is being compliant with meds, especially diuretics and anticoagulation. CHA2DS2VASc score of  7.   Today, he denies symptoms of palpitations, chest pain, shortness of breath, orthopnea, PND, lower extremity edema, dizziness, presyncope, syncope, or neurologic sequela. The patient is tolerating medications without difficulties and is otherwise without complaint today.   Past Medical History:  Diagnosis Date  . Atrial fibrillation ([place])   . CHF (congestive heart failure) ([place])   . Hepatitis C   . Hypertension   . Stroke ([place])   . Visit for monitoring Tikosyn therapy 03/26/2017   Past Surgical History:  Procedure Laterality Date  . CARDIAC DEFIBRILLATOR PLACEMENT  2015  . CARDIOVERSION N/A 10/10/2016   Procedure: CARDIOVERSION;  Surgeon: Dorothy Spark, MD;   Location: [place];  Service: Cardiovascular;  Laterality: N/A;  . CARDIOVERSION N/A 03/27/2017   Procedure: CARDIOVERSION;  Surgeon: Jerline Pain, MD;  Location: [facility] ENDOSCOPY;  Service: Cardiovascular;  Laterality: N/A;  . CARDIOVERSION N/A 10/29/2018   Procedure: CARDIOVERSION;  Surgeon: Sanda Klein, MD;  Location: [facility] ENDOSCOPY;  Service: Cardiovascular;  Laterality: N/A;  . CARDIOVERSION N/A 11/05/2018   Procedure: CARDIOVERSION;  Surgeon: Acie Fredrickson Wonda Cheng, MD;  Location: [place];  Service: Cardiovascular;  Laterality: N/A;  . EYE SURGERY Left 1990  . IR EXCHANGE BILIARY DRAIN  02/11/2019  . IR IVC FILTER PLMT / S&I /IMG GUID/MOD SED  12/04/2018  . IR PERC CHOLECYSTOSTOMY  12/13/2018  . IR PERCUTANEOUS ART THROMBECTOMY/INFUSION INTRACRANIAL INC DIAG ANGIO  09/05/2016  . IR RADIOLOGIST EVAL & MGMT  10/03/2016  . RADIOLOGY WITH ANESTHESIA N/A 09/05/2016   Procedure: RADIOLOGY WITH ANESTHESIA;  Surgeon: Luanne Bras, MD;  Location: [place];  Service: Radiology;  Laterality: N/A;  . RIGHT/LEFT HEART CATH AND CORONARY ANGIOGRAPHY N/A 11/03/2018   Procedure: RIGHT/LEFT HEART CATH AND CORONARY ANGIOGRAPHY;  Surgeon: Lorretta Harp, MD;  Location: [place] CV LAB;  Service: Cardiovascular;  Laterality: N/A;  . TEE WITHOUT CARDIOVERSION N/A 02/06/2019   Procedure: TRANSESOPHAGEAL ECHOCARDIOGRAM (TEE);  Surgeon: Skeet Latch, MD;  Location: [facility] ENDOSCOPY;  Service: Cardiovascular;  Laterality: N/A;    Current Outpatient Medications  Medication Sig Dispense Refill  . acetaminophen (TYLENOL) 325 MG tablet Take 2 tablets (650 mg total) by mouth every 4 (four) hours as needed for mild pain (or temp > 37.5 C (99.5 F)).    Marland Kitchen atorvastatin (LIPITOR) 20 MG tablet Take 1 tablet (20 mg total)  by mouth daily at 6 PM. 30 tablet 0  . calcium carbonate (TUMS - DOSED IN MG ELEMENTAL CALCIUM) 500 MG chewable tablet Chew 1 tablet (200 mg of elemental calcium total) by mouth daily as needed for  indigestion or heartburn. 30 tablet 1  . dabigatran ([name]) 150 MG CAPS capsule Take 1 capsule (150 mg total) by mouth 2 (two) times daily. 60 capsule 0  . digoxin (LANOXIN) 0.25 MG tablet Take 1 tablet (0.25 mg total) by mouth daily. 30 tablet 0  . furosemide (LASIX) 40 MG tablet Take 1 tablet (40 mg total) by mouth daily. 30 tablet 0  . gabapentin (NEURONTIN) 300 MG capsule Take 1 capsule (300 mg total) by mouth every 8 (eight) hours. 90 capsule 0  . ipratropium (ATROVENT) 0.02 % nebulizer solution Take 2.5 mLs (0.5 mg total) by nebulization every 6 (six) hours as needed for wheezing or shortness of breath. 75 mL 12  . levalbuterol (XOPENEX) 0.63 MG/3ML nebulizer solution Take 3 mLs (0.63 mg total) by nebulization every 6 (six) hours as needed for wheezing or shortness of breath. 3 mL 12  . metoprolol succinate (TOPROL-XL) 100 MG 24 hr tablet Take 1 tablet (100 mg total) by mouth daily. Take with or immediately following a meal. 30 tablet 0  . pantoprazole (PROTONIX) 40 MG tablet Take 1 tablet (40 mg total) by mouth daily. 30 tablet 0  . potassium chloride SA (KLOR-CON) 20 MEQ tablet Take 1 tablet (20 mEq total) by mouth daily. 30 tablet 0  . sacubitril-valsartan (ENTRESTO) 24-26 MG Take 1 tablet by mouth 2 (two) times daily. 60 tablet 0  . spironolactone (ALDACTONE) 25 MG tablet Take 1 tablet (25 mg total) by mouth daily. 30 tablet 0  . traMADol (ULTRAM) 50 MG tablet Take 1 tablet (50 mg total) by mouth every 8 (eight) hours as needed for severe pain. 30 tablet    No current facility-administered medications for this encounter.    Allergies  Allergen Reactions  . Benadryl [Diphenhydramine] Palpitations    Social History   Socioeconomic History  . Marital status: Single    Spouse name: Not on file  . Number of children: Not on file  . Years of education: 62 (some college)  . Highest education level: Not on file  Occupational History  . Occupation: disability  Tobacco Use  .  Smoking status: Former Smoker    Packs/day: 0.50    Types: Cigarettes  . Smokeless tobacco: Never Used  . Tobacco comment: a pack last three days  Substance and Sexual Activity  . Alcohol use: Not Currently    Alcohol/week: 3.0 standard drinks    Types: 3 Cans of beer per week    Comment: pt stop drinking   . Drug use: Not Currently    Frequency: 2.0 times per week    Types: Marijuana    Comment: stop smoking   . Sexual activity: Yes    Partners: Female    Birth control/protection: Condom  Other Topics Concern  . Not on file  Social History Narrative  . Not on file   Social Determinants of Health   Financial Resource Strain:   . Difficulty of Paying Living Expenses: Not on file  Food Insecurity:   . Worried About Charity fundraiser in the Last Year: Not on file  . Ran Out of Food in the Last Year: Not on file  Transportation Needs:   . Lack of Transportation (Medical): Not on file  . Lack of  Transportation (Non-Medical): Not on file  Physical Activity:   . Days of Exercise per Week: Not on file  . Minutes of Exercise per Session: Not on file  Stress:   . Feeling of Stress : Not on file  Social Connections:   . Frequency of Communication with Friends and Family: Not on file  . Frequency of Social Gatherings with Friends and Family: Not on file  . Attends Religious Services: Not on file  . Active Member of Clubs or Organizations: Not on file  . Attends Archivist Meetings: Not on file  . Marital Status: Not on file  Intimate Partner Violence:   . Fear of Current or Ex-Partner: Not on file  . Emotionally Abused: Not on file  . Physically Abused: Not on file  . Sexually Abused: Not on file    Family History  Problem Relation Age of Onset  . High blood pressure Mother   . High blood pressure Father   . Stroke Maternal Aunt   . Heart disease Neg Hx     ROS- All systems are reviewed and negative except as per the HPI above  Physical Exam: Vitals:    02/17/19 1420  BP: 124/60  Pulse: 85  Weight: 77.7 kg  Height: 5\' 7"  (1.702 m)   Wt Readings from Last 3 Encounters:  02/17/19 77.7 kg  02/07/19 78.4 kg  12/24/18 75.9 kg    Labs: Lab Results  Component Value Date   NA 139 02/07/2019   K 4.2 02/07/2019   CL 104 02/07/2019   CO2 26 02/07/2019   GLUCOSE 94 02/07/2019   BUN 11 02/07/2019   CREATININE 1.12 02/07/2019   CALCIUM 9.0 02/07/2019   PHOS 3.4 12/14/2018   MG 2.0 02/06/2019   Lab Results  Component Value Date   INR 1.6 (H) 02/06/2019   Lab Results  Component Value Date   CHOL 135 11/11/2018   HDL 24 (L) 11/11/2018   LDLCALC 96 11/11/2018   TRIG 75 11/11/2018     GEN- The patient is well appearing, alert and oriented x 3 today.   Head- normocephalic, atraumatic Eyes-  Sclera clear, conjunctiva pink Ears- hearing intact Oropharynx- clear Neck- supple, no JVP Lymph- no cervical lymphadenopathy Lungs- Clear to ausculation bilaterally, normal work of breathing Heart- Regular rate and rhythm, no murmurs, rubs or gallops, PMI not laterally displaced GI- soft, NT, ND, + BS Extremities- no clubbing, cyanosis, or edema MS- no significant deformity or atrophy Skin- no rash or lesion Psych- euthymic mood, full affect Neuro- strength and sensation are intact  EKG-afib at 85 bpm, qrs int 94 ms, qtc 421 ms Epic records reviewed    Assessment and Plan: 1. Persistent afib Now rate controlled Continue digoxin, BB  DCCV not performed in hospital 2/2 left atrial clot, now back on anticoagulation Cmet, dig,  Cbc today  2. CHA2DS2VASc score of 7 Now back on pradaxa 150 mg bid, off 1-2 weeks since leaving SNF  TEE 12/18 showed LAA thrombus Will need echo in 6-8 weeks to see if resolved ? Try cardioversion at that time, if so, I will need to clarify with EP if this is to be repeated, as he has had unsuccessful DCCV's in the past since off tikosyn  3. CHF Had been off diuretics since leaving SNF 1-2 weeks  ago Now back on and is normovolemic   F/u in one month here He has not had f/u with PCP as discussed at d/c and was encouraged to  make this appointment  Geroge Baseman. Korbin Notaro, [facility] [address] [place], [place] [address] [phone]

## 2019-02-18 ENCOUNTER — Encounter (HOSPITAL_COMMUNITY): Payer: Self-pay | Admitting: Nurse Practitioner

## 2019-02-19 ENCOUNTER — Telehealth (HOSPITAL_COMMUNITY): Payer: Self-pay

## 2019-02-19 DIAGNOSIS — I4891 Unspecified atrial fibrillation: Secondary | ICD-10-CM | POA: Diagnosis not present

## 2019-02-19 DIAGNOSIS — I5022 Chronic systolic (congestive) heart failure: Secondary | ICD-10-CM | POA: Diagnosis not present

## 2019-02-19 DIAGNOSIS — J690 Pneumonitis due to inhalation of food and vomit: Secondary | ICD-10-CM | POA: Diagnosis not present

## 2019-02-19 DIAGNOSIS — I69191 Dysphagia following nontraumatic intracerebral hemorrhage: Secondary | ICD-10-CM | POA: Diagnosis not present

## 2019-02-19 DIAGNOSIS — I130 Hypertensive heart and chronic kidney disease with heart failure and stage 1 through stage 4 chronic kidney disease, or unspecified chronic kidney disease: Secondary | ICD-10-CM | POA: Diagnosis not present

## 2019-02-19 DIAGNOSIS — I82402 Acute embolism and thrombosis of unspecified deep veins of left lower extremity: Secondary | ICD-10-CM | POA: Diagnosis not present

## 2019-02-19 DIAGNOSIS — I82401 Acute embolism and thrombosis of unspecified deep veins of right lower extremity: Secondary | ICD-10-CM | POA: Diagnosis not present

## 2019-02-19 DIAGNOSIS — R1310 Dysphagia, unspecified: Secondary | ICD-10-CM | POA: Diagnosis not present

## 2019-02-19 DIAGNOSIS — I69154 Hemiplegia and hemiparesis following nontraumatic intracerebral hemorrhage affecting left non-dominant side: Secondary | ICD-10-CM | POA: Diagnosis not present

## 2019-02-19 MED ORDER — FUROSEMIDE 40 MG PO TABS: ORAL_TABLET | ORAL | 0 refills | Status: DC

## 2019-02-19 NOTE — Telephone Encounter (Signed)
Patient was notified regarding kidney results and will contact if he has any other concerns. Patient understood. Med list updated.

## 2019-02-19 NOTE — Telephone Encounter (Signed)
-----   Message from Sherran Needs, NP sent at 02/19/2019  8:44 AM EST ----- Kidney function is up, please ask pt to alternate 20 mg lasix with 40 mg lasix. Watch for any signs of fluid overload (swelling, increasing daily weights. Shortness of breath) and report to office.

## 2019-02-23 DIAGNOSIS — I619 Nontraumatic intracerebral hemorrhage, unspecified: Secondary | ICD-10-CM | POA: Diagnosis not present

## 2019-02-23 DIAGNOSIS — I639 Cerebral infarction, unspecified: Secondary | ICD-10-CM | POA: Diagnosis not present

## 2019-02-27 DIAGNOSIS — J690 Pneumonitis due to inhalation of food and vomit: Secondary | ICD-10-CM | POA: Diagnosis not present

## 2019-02-27 DIAGNOSIS — I4891 Unspecified atrial fibrillation: Secondary | ICD-10-CM | POA: Diagnosis not present

## 2019-02-27 DIAGNOSIS — K819 Cholecystitis, unspecified: Secondary | ICD-10-CM | POA: Diagnosis not present

## 2019-02-27 DIAGNOSIS — I5022 Chronic systolic (congestive) heart failure: Secondary | ICD-10-CM | POA: Diagnosis not present

## 2019-02-27 DIAGNOSIS — I129 Hypertensive chronic kidney disease with stage 1 through stage 4 chronic kidney disease, or unspecified chronic kidney disease: Secondary | ICD-10-CM | POA: Diagnosis not present

## 2019-02-27 DIAGNOSIS — I69154 Hemiplegia and hemiparesis following nontraumatic intracerebral hemorrhage affecting left non-dominant side: Secondary | ICD-10-CM | POA: Diagnosis not present

## 2019-02-27 DIAGNOSIS — R1310 Dysphagia, unspecified: Secondary | ICD-10-CM | POA: Diagnosis not present

## 2019-02-27 DIAGNOSIS — I69191 Dysphagia following nontraumatic intracerebral hemorrhage: Secondary | ICD-10-CM | POA: Diagnosis not present

## 2019-02-27 DIAGNOSIS — Z4803 Encounter for change or removal of drains: Secondary | ICD-10-CM | POA: Diagnosis not present

## 2019-03-03 DIAGNOSIS — I129 Hypertensive chronic kidney disease with stage 1 through stage 4 chronic kidney disease, or unspecified chronic kidney disease: Secondary | ICD-10-CM | POA: Diagnosis not present

## 2019-03-03 DIAGNOSIS — I69191 Dysphagia following nontraumatic intracerebral hemorrhage: Secondary | ICD-10-CM | POA: Diagnosis not present

## 2019-03-03 DIAGNOSIS — K819 Cholecystitis, unspecified: Secondary | ICD-10-CM | POA: Diagnosis not present

## 2019-03-03 DIAGNOSIS — J690 Pneumonitis due to inhalation of food and vomit: Secondary | ICD-10-CM | POA: Diagnosis not present

## 2019-03-03 DIAGNOSIS — I69154 Hemiplegia and hemiparesis following nontraumatic intracerebral hemorrhage affecting left non-dominant side: Secondary | ICD-10-CM | POA: Diagnosis not present

## 2019-03-03 DIAGNOSIS — R1310 Dysphagia, unspecified: Secondary | ICD-10-CM | POA: Diagnosis not present

## 2019-03-03 DIAGNOSIS — I5022 Chronic systolic (congestive) heart failure: Secondary | ICD-10-CM | POA: Diagnosis not present

## 2019-03-03 DIAGNOSIS — Z4803 Encounter for change or removal of drains: Secondary | ICD-10-CM | POA: Diagnosis not present

## 2019-03-03 DIAGNOSIS — I4891 Unspecified atrial fibrillation: Secondary | ICD-10-CM | POA: Diagnosis not present

## 2019-03-06 ENCOUNTER — Telehealth: Payer: Self-pay

## 2019-03-06 DIAGNOSIS — I69191 Dysphagia following nontraumatic intracerebral hemorrhage: Secondary | ICD-10-CM | POA: Diagnosis not present

## 2019-03-06 DIAGNOSIS — I5022 Chronic systolic (congestive) heart failure: Secondary | ICD-10-CM | POA: Diagnosis not present

## 2019-03-06 DIAGNOSIS — I4891 Unspecified atrial fibrillation: Secondary | ICD-10-CM | POA: Diagnosis not present

## 2019-03-06 DIAGNOSIS — R1310 Dysphagia, unspecified: Secondary | ICD-10-CM | POA: Diagnosis not present

## 2019-03-06 DIAGNOSIS — I129 Hypertensive chronic kidney disease with stage 1 through stage 4 chronic kidney disease, or unspecified chronic kidney disease: Secondary | ICD-10-CM | POA: Diagnosis not present

## 2019-03-06 DIAGNOSIS — K819 Cholecystitis, unspecified: Secondary | ICD-10-CM | POA: Diagnosis not present

## 2019-03-06 DIAGNOSIS — J690 Pneumonitis due to inhalation of food and vomit: Secondary | ICD-10-CM | POA: Diagnosis not present

## 2019-03-06 DIAGNOSIS — Z4803 Encounter for change or removal of drains: Secondary | ICD-10-CM | POA: Diagnosis not present

## 2019-03-06 DIAGNOSIS — I69154 Hemiplegia and hemiparesis following nontraumatic intracerebral hemorrhage affecting left non-dominant side: Secondary | ICD-10-CM | POA: Diagnosis not present

## 2019-03-06 NOTE — Telephone Encounter (Signed)
I spoke with the pt and explained to him that his monitor sends automatically. He do not have to push the button. I also told him he have about a year left on the battery. He states he been having some issues with the bag draining on his side. I told him I do not know who he needs to call about that. The pt verbalized understanding and thanked me for the call.

## 2019-03-10 DIAGNOSIS — I69191 Dysphagia following nontraumatic intracerebral hemorrhage: Secondary | ICD-10-CM | POA: Diagnosis not present

## 2019-03-10 DIAGNOSIS — J690 Pneumonitis due to inhalation of food and vomit: Secondary | ICD-10-CM | POA: Diagnosis not present

## 2019-03-10 DIAGNOSIS — I69154 Hemiplegia and hemiparesis following nontraumatic intracerebral hemorrhage affecting left non-dominant side: Secondary | ICD-10-CM | POA: Diagnosis not present

## 2019-03-10 DIAGNOSIS — I4891 Unspecified atrial fibrillation: Secondary | ICD-10-CM | POA: Diagnosis not present

## 2019-03-10 DIAGNOSIS — Z4803 Encounter for change or removal of drains: Secondary | ICD-10-CM | POA: Diagnosis not present

## 2019-03-10 DIAGNOSIS — K819 Cholecystitis, unspecified: Secondary | ICD-10-CM | POA: Diagnosis not present

## 2019-03-10 DIAGNOSIS — R1310 Dysphagia, unspecified: Secondary | ICD-10-CM | POA: Diagnosis not present

## 2019-03-10 DIAGNOSIS — I5022 Chronic systolic (congestive) heart failure: Secondary | ICD-10-CM | POA: Diagnosis not present

## 2019-03-10 DIAGNOSIS — I129 Hypertensive chronic kidney disease with stage 1 through stage 4 chronic kidney disease, or unspecified chronic kidney disease: Secondary | ICD-10-CM | POA: Diagnosis not present

## 2019-03-12 ENCOUNTER — Ambulatory Visit (INDEPENDENT_AMBULATORY_CARE_PROVIDER_SITE_OTHER): Payer: Medicare HMO | Admitting: Family Medicine

## 2019-03-12 ENCOUNTER — Encounter: Payer: Self-pay | Admitting: Family Medicine

## 2019-03-12 ENCOUNTER — Other Ambulatory Visit: Payer: Self-pay

## 2019-03-12 ENCOUNTER — Other Ambulatory Visit: Payer: Self-pay | Admitting: Family Medicine

## 2019-03-12 VITALS — BP 145/82 | HR 60 | Temp 97.9°F | Wt 172.0 lb

## 2019-03-12 DIAGNOSIS — I5043 Acute on chronic combined systolic (congestive) and diastolic (congestive) heart failure: Secondary | ICD-10-CM | POA: Diagnosis not present

## 2019-03-12 DIAGNOSIS — I63511 Cerebral infarction due to unspecified occlusion or stenosis of right middle cerebral artery: Secondary | ICD-10-CM | POA: Diagnosis not present

## 2019-03-12 DIAGNOSIS — I10 Essential (primary) hypertension: Secondary | ICD-10-CM | POA: Diagnosis not present

## 2019-03-12 DIAGNOSIS — I4811 Longstanding persistent atrial fibrillation: Secondary | ICD-10-CM

## 2019-03-12 MED ORDER — SPIRONOLACTONE 25 MG PO TABS: 25.0000 mg | ORAL_TABLET | Freq: Every day | ORAL | 0 refills | Status: DC

## 2019-03-12 MED ORDER — TRAMADOL HCL 50 MG PO TABS: 50.0000 mg | ORAL_TABLET | Freq: Three times a day (TID) | ORAL | 0 refills | Status: DC | PRN

## 2019-03-12 MED ORDER — TRAMADOL HCL 50 MG PO TABS: 50.0000 mg | ORAL_TABLET | Freq: Three times a day (TID) | ORAL | Status: DC | PRN

## 2019-03-12 MED ORDER — CALCIUM CARBONATE ANTACID 500 MG PO CHEW: 1.0000 | CHEWABLE_TABLET | Freq: Every day | ORAL | 1 refills | Status: DC | PRN

## 2019-03-12 MED ORDER — SACUBITRIL-VALSARTAN 24-26 MG PO TABS: 1.0000 | ORAL_TABLET | Freq: Two times a day (BID) | ORAL | 0 refills | Status: DC

## 2019-03-12 MED ORDER — ATROVENT HFA 17 MCG/ACT IN AERS: 2.0000 | INHALATION_SPRAY | Freq: Four times a day (QID) | RESPIRATORY_TRACT | 0 refills | Status: DC | PRN

## 2019-03-12 MED ORDER — LEVALBUTEROL TARTRATE 45 MCG/ACT IN AERO: 1.0000 | INHALATION_SPRAY | RESPIRATORY_TRACT | 0 refills | Status: DC | PRN

## 2019-03-12 MED ORDER — GABAPENTIN 300 MG PO CAPS: 300.0000 mg | ORAL_CAPSULE | Freq: Three times a day (TID) | ORAL | 0 refills | Status: DC

## 2019-03-12 MED ORDER — DIGOXIN 250 MCG PO TABS: 0.2500 mg | ORAL_TABLET | Freq: Every day | ORAL | 0 refills | Status: DC

## 2019-03-12 MED ORDER — PANTOPRAZOLE SODIUM 40 MG PO TBEC: 40.0000 mg | DELAYED_RELEASE_TABLET | Freq: Every day | ORAL | 0 refills | Status: DC

## 2019-03-12 MED ORDER — METOPROLOL SUCCINATE ER 100 MG PO TB24: 100.0000 mg | ORAL_TABLET | Freq: Every day | ORAL | 0 refills | Status: DC

## 2019-03-12 MED ORDER — POTASSIUM CHLORIDE CRYS ER 20 MEQ PO TBCR: 20.0000 meq | EXTENDED_RELEASE_TABLET | Freq: Every day | ORAL | 0 refills | Status: DC

## 2019-03-12 MED ORDER — ATORVASTATIN CALCIUM 20 MG PO TABS: 20.0000 mg | ORAL_TABLET | Freq: Every day | ORAL | 0 refills | Status: DC

## 2019-03-12 NOTE — Progress Notes (Addendum)
Date of Visit: 03/12/2019   HPI:  Glenn [name] presents today for medication refill  Medication Refill: Patient recently discharged from hospital 12/19 with significant changes to his medications. He presents today for refill. His son helps him manage his medications at home.  After last hospitalization back in mid December patient now on metoprolol 100 mg daily, digoxin 0.25 mg daily, [name] 150 mg twice daily.  He has been taking these as prescribed and has had no issues with his A. fib.  The patient is also taking Entresto 24/26 twice daily, and spironolactone 25 mg daily, furosemide 40 mg daily for his heart failure.  He has not been any new swelling or respiratory issues since his discharge.  Anxiety: Patient discusses past medical history of anxiety previously treated with valium. He notes recent issues of anxiety manifesting as frustration. States he has not used valium since his hospitalization in September. Asserts pharmacy said he must discuss future use of valium with PCP.  ROS: See HPI.  PHYSICAL EXAM: BP (!) 145/82   Pulse 60   Temp 97.9 F (36.6 C) (Oral)   Wt 172 lb (78 kg)   SpO2 97%   BMI 26.94 kg/m  Gen: Appears fatigue, wearing lose fit clothing, conversing appropriately  Heart: Irregularly irregular heartbeat, skin warm and dry.  No M/R/G noted. Lungs: Lungs clear to auscultation bilaterally, no distress GI: RUQ cholecystostomy tube in place. No skin breakdown, no erythema surrounding it. Neuro: No focal neurologic deficit Ext: 1+ bilateral lower extremity edema   ASSESSMENT/PLAN:  Atrial fibrillation ([name]) Doing well from the standpoint on metoprolol XL 100 mg daily, digoxin 0.25 mg daily, [name] 100 mg twice daily.  Has follow-up with cardiology in early February.  Acute on chronic combined systolic and diastolic CHF (congestive heart failure) ([place]) Doing well from this standpoint.  1+ pitting edema bilateral lower extremity which is not worsened since leaving the  hospital.  Continue spironolactone 25 mg daily, Entresto 24/26 twice daily, furosemide 40 mg daily.  Has follow-up with cardiology in early February.  Right middle cerebral artery stroke [facility]) Has made a lot of progress from the standpoint.  Has graduated from physical therapy.  Able to walk in without assistance to clinic visit today.  Encouraged him to follow-up at next appointment with neurology.  On [name] 150 mg twice daily.  Atorvastatin 20 mg daily.  [name] MD PGY-3 Family Medicine Resident

## 2019-03-12 NOTE — Patient Instructions (Signed)
It was great seeing you again today!  Congratulations on how far you have come!  I refilled several medications for you today sent them to your preferred pharmacy.  We will get a check of your kidney function, which will also double as a check of your potassium.  I will give you a call with the results.

## 2019-03-13 ENCOUNTER — Other Ambulatory Visit: Payer: Self-pay | Admitting: *Deleted

## 2019-03-13 MED ORDER — LEVALBUTEROL TARTRATE 45 MCG/ACT IN AERO: 1.0000 | INHALATION_SPRAY | RESPIRATORY_TRACT | 0 refills | Status: DC | PRN

## 2019-03-13 MED ORDER — ATROVENT HFA 17 MCG/ACT IN AERS: 2.0000 | INHALATION_SPRAY | Freq: Four times a day (QID) | RESPIRATORY_TRACT | 0 refills | Status: DC | PRN

## 2019-03-14 ENCOUNTER — Encounter: Payer: Self-pay | Admitting: Family Medicine

## 2019-03-14 NOTE — Assessment & Plan Note (Signed)
Doing well from the standpoint on metoprolol XL 100 mg daily, digoxin 0.25 mg daily, [name] 100 mg twice daily.  Has follow-up with cardiology in early February.

## 2019-03-14 NOTE — Assessment & Plan Note (Signed)
Has made a lot of progress from the standpoint.  Has graduated from physical therapy.  Able to walk in without assistance to clinic visit today.  Encouraged him to follow-up at next appointment with neurology.  On [name] 150 mg twice daily.  Atorvastatin 20 mg daily.

## 2019-03-14 NOTE — Assessment & Plan Note (Signed)
Doing well from this standpoint.  1+ pitting edema bilateral lower extremity which is not worsened since leaving the hospital.  Continue spironolactone 25 mg daily, Entresto 24/26 twice daily, furosemide 40 mg daily.  Has follow-up with cardiology in early February.

## 2019-03-15 DIAGNOSIS — I5022 Chronic systolic (congestive) heart failure: Secondary | ICD-10-CM | POA: Diagnosis not present

## 2019-03-15 DIAGNOSIS — I69154 Hemiplegia and hemiparesis following nontraumatic intracerebral hemorrhage affecting left non-dominant side: Secondary | ICD-10-CM | POA: Diagnosis not present

## 2019-03-15 DIAGNOSIS — K819 Cholecystitis, unspecified: Secondary | ICD-10-CM | POA: Diagnosis not present

## 2019-03-15 DIAGNOSIS — I4891 Unspecified atrial fibrillation: Secondary | ICD-10-CM | POA: Diagnosis not present

## 2019-03-15 DIAGNOSIS — R1310 Dysphagia, unspecified: Secondary | ICD-10-CM | POA: Diagnosis not present

## 2019-03-15 DIAGNOSIS — Z4803 Encounter for change or removal of drains: Secondary | ICD-10-CM | POA: Diagnosis not present

## 2019-03-15 DIAGNOSIS — I129 Hypertensive chronic kidney disease with stage 1 through stage 4 chronic kidney disease, or unspecified chronic kidney disease: Secondary | ICD-10-CM | POA: Diagnosis not present

## 2019-03-15 DIAGNOSIS — J690 Pneumonitis due to inhalation of food and vomit: Secondary | ICD-10-CM | POA: Diagnosis not present

## 2019-03-15 DIAGNOSIS — I69191 Dysphagia following nontraumatic intracerebral hemorrhage: Secondary | ICD-10-CM | POA: Diagnosis not present

## 2019-03-16 ENCOUNTER — Other Ambulatory Visit: Payer: Self-pay | Admitting: *Deleted

## 2019-03-16 MED ORDER — ATROVENT HFA 17 MCG/ACT IN AERS: 2.0000 | INHALATION_SPRAY | Freq: Four times a day (QID) | RESPIRATORY_TRACT | 0 refills | Status: DC | PRN

## 2019-03-19 ENCOUNTER — Ambulatory Visit (HOSPITAL_COMMUNITY): Payer: Medicare HMO | Admitting: Nurse Practitioner

## 2019-03-23 ENCOUNTER — Encounter: Payer: Medicare HMO | Admitting: Student

## 2019-03-23 NOTE — Progress Notes (Deleted)
Electrophysiology Office Note Date: 03/23/2019  ID:  Glenn [name], DOB 10-13-1950, MRN [id number]  PCP: Glenn Dawn, MD Primary Cardiologist: Virl Axe, MD Electrophysiologist: Dr. [name]   CC: Routine ICD follow-up  Glenn [name] is a 69 y.o. male seen today for Dr. [name].  They present today for routine electrophysiology followup.  Since last being seen in our clinic, the patient reports doing very well. They deny chest pain, palpitations, dyspnea, PND, orthopnea, nausea, vomiting, dizziness, syncope, edema, weight gain, or early satiety.  He has not had ICD shocks.   Device History: Research officer, [employer] party ICD implanted 01/01/2014 for NICM History of appropriate therapy: No History of AAD therapy: Yes   Past Medical History:  Diagnosis Date  . Atrial fibrillation ([place])   . CHF (congestive heart failure) ([place])   . Hepatitis C   . Hypertension   . Stroke ([place])   . Visit for monitoring Tikosyn therapy 03/26/2017   Past Surgical History:  Procedure Laterality Date  . CARDIAC DEFIBRILLATOR PLACEMENT  2015  . CARDIOVERSION N/A 10/10/2016   Procedure: CARDIOVERSION;  Surgeon: Dorothy Spark, MD;  Location: [place];  Service: Cardiovascular;  Laterality: N/A;  . CARDIOVERSION N/A 03/27/2017   Procedure: CARDIOVERSION;  Surgeon: Jerline Pain, MD;  Location: [facility] ENDOSCOPY;  Service: Cardiovascular;  Laterality: N/A;  . CARDIOVERSION N/A 10/29/2018   Procedure: CARDIOVERSION;  Surgeon: Sanda Klein, MD;  Location: [place] ENDOSCOPY;  Service: Cardiovascular;  Laterality: N/A;  . CARDIOVERSION N/A 11/05/2018   Procedure: CARDIOVERSION;  Surgeon: Acie Fredrickson Wonda Cheng, MD;  Location: [place];  Service: Cardiovascular;  Laterality: N/A;  . EYE SURGERY Left 1990  . IR EXCHANGE BILIARY DRAIN  02/11/2019  . IR IVC FILTER PLMT / S&I /IMG GUID/MOD SED  12/04/2018  . IR PERC CHOLECYSTOSTOMY  12/13/2018  . IR PERCUTANEOUS ART THROMBECTOMY/INFUSION INTRACRANIAL  INC DIAG ANGIO  09/05/2016  . IR RADIOLOGIST EVAL & MGMT  10/03/2016  . RADIOLOGY WITH ANESTHESIA N/A 09/05/2016   Procedure: RADIOLOGY WITH ANESTHESIA;  Surgeon: Luanne Bras, MD;  Location: [place];  Service: Radiology;  Laterality: N/A;  . RIGHT/LEFT HEART CATH AND CORONARY ANGIOGRAPHY N/A 11/03/2018   Procedure: RIGHT/LEFT HEART CATH AND CORONARY ANGIOGRAPHY;  Surgeon: Lorretta Harp, MD;  Location: [place] CV LAB;  Service: Cardiovascular;  Laterality: N/A;  . TEE WITHOUT CARDIOVERSION N/A 02/06/2019   Procedure: TRANSESOPHAGEAL ECHOCARDIOGRAM (TEE);  Surgeon: Skeet Latch, MD;  Location: [facility] ENDOSCOPY;  Service: Cardiovascular;  Laterality: N/A;    Current Outpatient Medications  Medication Sig Dispense Refill  . acetaminophen (TYLENOL) 325 MG tablet Take 2 tablets (650 mg total) by mouth every 4 (four) hours as needed for mild pain (or temp > 37.5 C (99.5 F)).    Marland Kitchen atorvastatin (LIPITOR) 20 MG tablet TAKE 1 TABLET(20 MG) BY MOUTH DAILY AT 6 PM 90 tablet 0  . calcium carbonate (TUMS - DOSED IN MG ELEMENTAL CALCIUM) 500 MG chewable tablet Chew 1 tablet (200 mg of elemental calcium total) by mouth daily as needed for indigestion or heartburn. 30 tablet 1  . dabigatran ([name]) 150 MG CAPS capsule Take 1 capsule (150 mg total) by mouth 2 (two) times daily. 60 capsule 0  . digoxin (LANOXIN) 0.25 MG tablet TAKE 1 TABLET(0.25 MG) BY MOUTH DAILY 90 tablet 0  . furosemide (LASIX) 40 MG tablet Take 1 tablet every other day alternating with 1/2 tablet every other day. 30 tablet 0  . gabapentin (NEURONTIN) 300 MG capsule Take 1  capsule (300 mg total) by mouth every 8 (eight) hours. 90 capsule 0  . ipratropium (ATROVENT HFA) 17 MCG/ACT inhaler Inhale 2 puffs into the lungs every 6 (six) hours as needed for wheezing. 2 Inhaler 0  . levalbuterol (XOPENEX HFA) 45 MCG/ACT inhaler Inhale 1-2 puffs into the lungs every 4 (four) hours as needed for wheezing. 2 Inhaler 0  . metoprolol succinate  (TOPROL-XL) 100 MG 24 hr tablet TAKE 1 TABLET(100 MG) BY MOUTH DAILY WITH OR IMMEDIATELY FOLLOWING A MEAL 90 tablet 0  . pantoprazole (PROTONIX) 40 MG tablet TAKE 1 TABLET(40 MG) BY MOUTH DAILY 90 tablet 0  . potassium chloride SA (KLOR-CON) 20 MEQ tablet TAKE 1 TABLET(20 MEQ) BY MOUTH DAILY 90 tablet 0  . sacubitril-valsartan (ENTRESTO) 24-26 MG Take 1 tablet by mouth 2 (two) times daily. 60 tablet 0  . spironolactone (ALDACTONE) 25 MG tablet TAKE 1 TABLET(25 MG) BY MOUTH DAILY 90 tablet 0  . traMADol (ULTRAM) 50 MG tablet Take 1 tablet (50 mg total) by mouth every 8 (eight) hours as needed for severe pain. 30 tablet 0   No current facility-administered medications for this visit.    Allergies:   Benadryl [diphenhydramine]   Social History: Social History   Socioeconomic History  . Marital status: Single    Spouse name: Not on file  . Number of children: Not on file  . Years of education: 39 (some college)  . Highest education level: Not on file  Occupational History  . Occupation: disability  Tobacco Use  . Smoking status: Former Smoker    Packs/day: 0.50    Types: Cigarettes  . Smokeless tobacco: Never Used  . Tobacco comment: a pack last three days  Substance and Sexual Activity  . Alcohol use: Not Currently    Alcohol/week: 3.0 standard drinks    Types: 3 Cans of beer per week    Comment: pt stop drinking   . Drug use: Not Currently    Frequency: 2.0 times per week    Types: Marijuana    Comment: stop smoking   . Sexual activity: Yes    Partners: Female    Birth control/protection: Condom  Other Topics Concern  . Not on file  Social History Narrative  . Not on file   Social Determinants of Health   Financial Resource Strain:   . Difficulty of Paying Living Expenses: Not on file  Food Insecurity:   . Worried About Charity fundraiser in the Last Year: Not on file  . Ran Out of Food in the Last Year: Not on file  Transportation Needs:   . Lack of  Transportation (Medical): Not on file  . Lack of Transportation (Non-Medical): Not on file  Physical Activity:   . Days of Exercise per Week: Not on file  . Minutes of Exercise per Session: Not on file  Stress:   . Feeling of Stress : Not on file  Social Connections:   . Frequency of Communication with Friends and Family: Not on file  . Frequency of Social Gatherings with Friends and Family: Not on file  . Attends Religious Services: Not on file  . Active Member of Clubs or Organizations: Not on file  . Attends Archivist Meetings: Not on file  . Marital Status: Not on file  Intimate Partner Violence:   . Fear of Current or Ex-Partner: Not on file  . Emotionally Abused: Not on file  . Physically Abused: Not on file  . Sexually Abused:  Not on file    Family History: Family History  Problem Relation Age of Onset  . High blood pressure Mother   . High blood pressure Father   . Stroke Maternal Aunt   . Heart disease Neg Hx     Review of Systems: All other systems reviewed and are otherwise negative except as noted above.   Physical Exam: There were no vitals filed for this visit.   GEN- The patient is well appearing, alert and oriented x 3 today.   HEENT: normocephalic, atraumatic; sclera clear, conjunctiva pink; hearing intact; oropharynx clear; neck supple, no JVP Lymph- no cervical lymphadenopathy Lungs- Clear to ausculation bilaterally, normal work of breathing.  No wheezes, rales, rhonchi Heart- Regular rate and rhythm, no murmurs, rubs or gallops, PMI not laterally displaced GI- soft, non-tender, non-distended, bowel sounds present, no hepatosplenomegaly Extremities- no clubbing, cyanosis, or edema; DP/PT/radial pulses 2+ bilaterally MS- no significant deformity or atrophy Skin- warm and dry, no rash or lesion; ICD pocket well healed Psych- euthymic mood, full affect Neuro- strength and sensation are intact  ICD interrogation- reviewed in detail today,   See PACEART report  EKG:  EKG is ordered today. The ekg ordered today shows ***  Recent Labs: 10/31/2018: TSH 2.566 02/03/2019: B Natriuretic Peptide 922.2 02/06/2019: Magnesium 2.0 02/17/2019: ALT 16; BUN 14; Creatinine, Ser 1.51; Hemoglobin 12.8; Platelets 317; Potassium 4.6; Sodium 141   Wt Readings from Last 3 Encounters:  03/12/19 172 lb (78 kg)  02/17/19 171 lb 6.4 oz (77.7 kg)  02/07/19 172 lb 14.4 oz (78.4 kg)     Other studies Reviewed: Additional studies/ records that were reviewed today include: ***   Assessment and Plan:  1.  Chronic systolic dysfunction s/p [employer] single chamber ICD  euvolemic today Stable on an appropriate medical regimen Normal ICD function See Pace Art report No changes today Volume status looks OK on exam.   2. Persistent AF Admitted in December for A/C CHF in setting of being sent home from rehab off several medications TEE at that time showed LA thrombus.  Continue [name] for CHA2DS2VASC of at least 7.   Will need Echo in 2-4 weeks to see if resolved.  Continue digoxin.   Current medicines are reviewed at length with the patient today.   The patient does not have concerns regarding his medicines.  The following changes were made today:  {NONE DEFAULTED:18576::"none"}  Labs/ tests ordered today include: *** No orders of the defined types were placed in this encounter.    Disposition:   Follow up with *** {gen number [id number] {TIME; UNITS DAY/WEEK/MONTH:19136}   Signed, [name], PA-C  03/23/2019 8:37 AM  [facility] [address] [place] [address] [phone] (office) [phone] (fax)

## 2019-03-24 DIAGNOSIS — J690 Pneumonitis due to inhalation of food and vomit: Secondary | ICD-10-CM | POA: Diagnosis not present

## 2019-03-24 DIAGNOSIS — I4891 Unspecified atrial fibrillation: Secondary | ICD-10-CM | POA: Diagnosis not present

## 2019-03-24 DIAGNOSIS — I69154 Hemiplegia and hemiparesis following nontraumatic intracerebral hemorrhage affecting left non-dominant side: Secondary | ICD-10-CM | POA: Diagnosis not present

## 2019-03-24 DIAGNOSIS — I5022 Chronic systolic (congestive) heart failure: Secondary | ICD-10-CM | POA: Diagnosis not present

## 2019-03-24 DIAGNOSIS — I129 Hypertensive chronic kidney disease with stage 1 through stage 4 chronic kidney disease, or unspecified chronic kidney disease: Secondary | ICD-10-CM | POA: Diagnosis not present

## 2019-03-24 DIAGNOSIS — I69191 Dysphagia following nontraumatic intracerebral hemorrhage: Secondary | ICD-10-CM | POA: Diagnosis not present

## 2019-03-24 DIAGNOSIS — K819 Cholecystitis, unspecified: Secondary | ICD-10-CM | POA: Diagnosis not present

## 2019-03-24 DIAGNOSIS — Z4803 Encounter for change or removal of drains: Secondary | ICD-10-CM | POA: Diagnosis not present

## 2019-03-24 DIAGNOSIS — R1310 Dysphagia, unspecified: Secondary | ICD-10-CM | POA: Diagnosis not present

## 2019-03-25 ENCOUNTER — Ambulatory Visit (INDEPENDENT_AMBULATORY_CARE_PROVIDER_SITE_OTHER): Payer: Medicare HMO | Admitting: Family Medicine

## 2019-03-25 ENCOUNTER — Other Ambulatory Visit: Payer: Self-pay

## 2019-03-25 VITALS — BP 142/75 | HR 75 | Wt 176.6 lb

## 2019-03-25 DIAGNOSIS — K819 Cholecystitis, unspecified: Secondary | ICD-10-CM

## 2019-03-25 DIAGNOSIS — M79605 Pain in left leg: Secondary | ICD-10-CM

## 2019-03-25 DIAGNOSIS — I10 Essential (primary) hypertension: Secondary | ICD-10-CM | POA: Diagnosis not present

## 2019-03-25 MED ORDER — GABAPENTIN 600 MG PO TABS: 600.0000 mg | ORAL_TABLET | Freq: Three times a day (TID) | ORAL | 0 refills | Status: DC

## 2019-03-25 NOTE — Patient Instructions (Signed)
It looks like your cholecystostomy tube was placed by an interventional radiologist named Jacqulynn Cadet.  It looks like the number to his office is [phone].  I would let them know that you had a cholecystostomy tube placed 16 weeks ago and it needs to be removed.  I believe we can significantly increase her gabapentin to 600 mg 3 times a day to help with your neuropathic type pain.  You can also pick up Voltaren gel for over-the-counter.  I will place a referral to pain medicine for you.  Lastly I think you would be a good idea to get a better look at your kidneys.  We will get low bit of blood work and I will give you a call on Friday with that.

## 2019-03-26 LAB — BASIC METABOLIC PANEL
BUN/Creatinine Ratio: 8 — ABNORMAL LOW (ref 10–24)
BUN: 9 mg/dL (ref 8–27)
CO2: 19 mmol/L — ABNORMAL LOW (ref 20–29)
Calcium: 9.7 mg/dL (ref 8.6–10.2)
Chloride: 106 mmol/L (ref 96–106)
Creatinine, Ser: 1.1 mg/dL (ref 0.76–1.27)
GFR calc Af Amer: 80 mL/min/{1.73_m2} (ref >59)
GFR calc non Af Amer: 69 mL/min/{1.73_m2} (ref >59)
Glucose: 96 mg/dL (ref 65–99)
Potassium: 5.2 mmol/L (ref 3.5–5.2)
Sodium: 142 mmol/L (ref 134–144)

## 2019-03-27 ENCOUNTER — Encounter: Payer: Self-pay | Admitting: Family Medicine

## 2019-03-27 DIAGNOSIS — M79605 Pain in left leg: Secondary | ICD-10-CM | POA: Insufficient documentation

## 2019-03-27 NOTE — Assessment & Plan Note (Signed)
Consistent with sciatica. Unclear if due to anatomic impingement or present due to previous stroke. Will increase gabapentin to 600mg  tid to see if this helps provide relief.

## 2019-03-27 NOTE — Progress Notes (Signed)
   CHIEF COMPLAINT / HPI: 69 year old male who presents for cholecystostomy bag follow up. Patient's cholecystostomy tube has been in for roughly 16 weeks and was only supposed to be in for 8-10. This has been exchanged once, while he was in the hospital. He was given no follow up plan for the bag to the best of his knowledge. At this point it is annoying for him to manage as he feels it getting caught on his clothes.  He states that he has also been having a lot of neuropathic pain in his legs. He states that the burning sensation in his bilateral legs L>R has gotten so severe it is bothering him at night and waking him up. He takes gabapentin 300mg  tid, which he states helps a little bit.   PERTINENT  PMH / PSH: acute cholecystitis   OBJECTIVE: BP (!) 142/75   Pulse 75   Wt 176 lb 9.6 oz (80.1 kg)   SpO2 97%   BMI 27.66 kg/m   Gen: 69 year old AA male, no acute distress CV: irregularly irregular, no tachycardia Resp: no accessory muscle use, no wheezing Abd: cholecystostomy tube in place, clean and dry. Neuro: Alert and oriented, Speech clear, No gross deficits   ASSESSMENT / PLAN:  Cholecystitis Patient with cholecystostomy tube in place and was lost to follow up. Recommended that he get into contact with the interventional radiologist for removal. I gave him this number. Asked for him to let me know if he is unable to reach the specialist and I can assist him.  Left leg pain Consistent with sciatica. Unclear if due to anatomic impingement or present due to previous stroke. Will increase gabapentin to 600mg  tid to see if this helps provide relief.     [name] MD PGY-3 Family Medicine Resident [place]

## 2019-03-27 NOTE — Assessment & Plan Note (Signed)
Patient with cholecystostomy tube in place and was lost to follow up. Recommended that he get into contact with the interventional radiologist for removal. I gave him this number. Asked for him to let me know if he is unable to reach the specialist and I can assist him.

## 2019-03-31 ENCOUNTER — Telehealth: Payer: Self-pay | Admitting: Physician Assistant

## 2019-03-31 ENCOUNTER — Telehealth: Payer: Self-pay | Admitting: Family Medicine

## 2019-03-31 NOTE — Telephone Encounter (Signed)
Returned call to patient at [phone] today per his request to discuss cholecystostomy removal.  Per patient he is followed by Dr. [name] and was told to contact IR to remove his tube as it is irritating to him. He stated to me today that the drain is functioning well and he has not issues other than it being annoying. He has not seen general surgery since his discharge in October of 2020 and has only seen [place] for a routine exchange and his family medicine physician. He states he was never told to see a Psychologist, sport and exercise.  Typically, percutaneous cholecystostomy's remain in place for 6-8 weeks and then the patient would be seen by general surgery to discuss definitive plans for cholecystectomy, if he is determined not to be a surgical candidate now or in the future then he would likely require a chronic cholecystostomy with routine exchanges every 6-8 weeks. His last exchange was 02/11/19, approximately 7 weeks ago. Additionally, he has several gallbladder stones (most recent seen on RUQ [place] dated 02/04/19) and removal of his cholecystostomy tube would almost certainly induce a recurrence of cholecystitis.   I have relayed this information to the patient today and I have placed a call to Dr. [name] to discuss the above.   As of right now, the patient will need to be seen by [facility] for a follow up to discuss cholecystectomy. If he is not a surgical candidate we would proceed with routine tube exchange at that time.   Please call IR with questions or concerns.  Candiss Norse, PA-C

## 2019-03-31 NOTE — Telephone Encounter (Signed)
Glenn [name] from interventional radiology is calling and would like to speak with Dr. Kris Mouton.   Pt has cholecystostomy tube and she would like to discuss removal.   The best call back number is [phone].

## 2019-04-01 ENCOUNTER — Encounter (HOSPITAL_COMMUNITY): Payer: Self-pay | Admitting: *Deleted

## 2019-04-03 NOTE — Telephone Encounter (Signed)
Called back and unfortunately was not there at the moment. Will continue to try, message left with a colleague at IR.  [name] MD PGY-3 Family Medicine Resident

## 2019-04-07 NOTE — Telephone Encounter (Signed)
Problem resolved. Patient informed he needs to go to [place] surgery for follow up.  [name] MD PGY-3 Family Medicine Resident

## 2019-04-20 ENCOUNTER — Other Ambulatory Visit: Payer: Self-pay

## 2019-04-20 MED ORDER — POTASSIUM CHLORIDE CRYS ER 20 MEQ PO TBCR: EXTENDED_RELEASE_TABLET | ORAL | 0 refills | Status: DC

## 2019-05-08 ENCOUNTER — Telehealth: Payer: Self-pay | Admitting: Emergency Medicine

## 2019-05-08 ENCOUNTER — Ambulatory Visit (INDEPENDENT_AMBULATORY_CARE_PROVIDER_SITE_OTHER): Payer: Medicare (Managed Care) | Admitting: *Deleted

## 2019-05-08 DIAGNOSIS — I420 Dilated cardiomyopathy: Secondary | ICD-10-CM

## 2019-05-08 LAB — CUP PACEART REMOTE DEVICE CHECK
Battery Remaining Longevity: 18 mo
Battery Remaining Percentage: 36 %
Brady Statistic RV Percent Paced: 5 %
Date Time Interrogation Session: 20210319001100
HighPow Impedance: 37 Ohm
Implantable Lead Implant Date: 20151113
Implantable Lead Location: 753860
Implantable Lead Model: 295
Implantable Lead Serial Number: 135220
Implantable Pulse Generator Implant Date: 20151113
Lead Channel Impedance Value: 668 Ohm
Lead Channel Pacing Threshold Amplitude: 0.6 V
Lead Channel Pacing Threshold Pulse Width: 0.5 ms
Lead Channel Setting Pacing Amplitude: 2.5 V
Lead Channel Setting Pacing Pulse Width: 0.5 ms
Lead Channel Setting Sensing Sensitivity: 0.5 mV
Pulse Gen Serial Number: 104417

## 2019-05-08 NOTE — Progress Notes (Signed)
ICD Remote  

## 2019-05-08 NOTE — Telephone Encounter (Signed)
LMOM to call device clinic and # provided. Scheduled remote received that showed 2450 NSVT episodes , 6 VT-1 episodes with no therapy . Some episodes appear to be possible AF RVR. Known PAF, + [name] and Toprol XL 100 mg po. Overdue for follow up with APP.

## 2019-05-10 NOTE — Progress Notes (Signed)
Cardiology Office Note Date:  05/12/2019  Patient ID:  Glenn [name], [place]igh[place] May 31, 1950, MRN [id number] PCP:  [name], MD  Cardiologist:  Dr. [name]     Chief Complaint: abnormal device remote  History of Present Illness: Glenn [name] is a 69 y.o. male with history of NICM, chronic CHF (systolic), ICD, Hep C, stroke (after missed doses of his Xarelto >> eliquis, recurrent stroke >> [name]), HTN  He has had a number of hospitalizations in the last several months  Admitted 10/30/2018  COPD, CHF exacerbations, notes report medication non compliance including his  And PAFib RVR LVEF 15-20%, clean cath Discharged 11/06/2018  Admitted 11/10/2018 Stroke, large territory subacute hemorrhagic infarct in the right MCA territory Cardiology consulted, found in AFlutter 120's Neurology noted  Large R MCA infarct with hemorrhagic conversion, embolic secondary to known AF on Eliquis s/p reversal w/ Kcentra Rate controlled,  Neurology: Future [facility] regimen once hemorrhagic conversion resolves Discharged to CIR 11/13/2018 Discharged from [facility] 12/02/2018 and Readmitted to in-patient Acute care  Febrile, rapid AFib , suspect sepsis, abd pain, respiratory distress amio gtt used for rate control  (given severe LV dysfunction avoiding CCB and failed BB historically) Found with extensive DVT and IVC filter placed sepsis felt 2/2 aspiration pneumonia and acute cholecystitis >> drain placed Blood cultures  11/25/2018: nagative x2 (5 days) 12/02/2018: negative x2 (5 days) 12/05/2018: one of 2 + for coag neg staph species (the other neg x5 days) Cardiology followed, was rate controlled at discharge, observed to have frequent PVCs Discharged on 12/24/2018 to SNF On pradaxa (though apparently not taking at home from the SNF  Admitted 02/04/2019 SOB, rapid AFib, CHF Started on amio gtt EP consulted rec TEE/DCCV TEE noted LVEF 15-20%, LA thrombus and no DCCV completed Discharged 02/07/2019  pradaxa dig Metoprolol Entresto Aldactone  He had AFib clnic follow up 12/29, was in rate controlled AFib, planned to discuss with EP, consideration for repeat echo/study vs proceed with DCCV after 6-8 weeks of uninterrupted a/c  Planned to have a 87mo follow in the clinic (not done yet)  05/08/2019 Device clinic note: Scheduled remote received that showed 2450 NSVT episodes , 6 VT-1 episodes with no therapy . Some episodes appear to be possible AF RVR. Known PAF, + [name] and Toprol XL 100 mg po Scheduled for f/u  Most recent labs 03/25/19 K+ 5.2 BUN/Creat 9/1.10   He is doing "OK".  Since his stroke he has slowed, but is ambulatory, able to get around his home, able to care for himself, lives with his son.  He is in a wheelchair here, says when he is going longer distances, or standing for a longtime his legs tend to get tired, this since his stroke. He reports no overt palpitations, though thinks he has been in Afib probably a day or two, says when in Afib he feels generally with less energy.  He will occassionally feel a sticking/fleeting type CP, and some vague sense of dizziness, no near syncope or syncope.  He reports compliance with his pradaxa, no missed doses for weeks or months even.  He reports compliance with all of his medicines, and reports not taking metoprolol or digoxin, as far as he knows since his hospital discharge.   He is certain about this.  He noticed his feet swollen yesterday took a whole tab of his lasix, has not taken it yet today.   No rest SOB, minimal DOE at his baseline.  He reports some symptoms of orthopnea  last night, though not routinely  He still has the GB drain in, waiting to hear back from the surgeon's office, is under the impression his PMD office is handling this..    Device information BSCi single chamber ICD, implanted 01/01/2014  AFib hx diagnosed +/- 2018  AAD Hx Tikosyn started discontinued after his stroke and discontinuation of  a/c  Past Medical History:  Diagnosis Date  . Acute blood loss anemia   . Acute CVA (cerebrovascular accident) ([place])   . Acute deep vein thrombosis (DVT) of both lower extremities ([name])   . Acute kidney injury ([place])   . Acute on chronic combined systolic and diastolic CHF (congestive heart failure) ([place])   . Acute renal failure superimposed on stage 3a chronic kidney disease ([place])   . Acute respiratory failure with hypoxia ([place])   . Atrial fibrillation ([place])   . Carpal tunnel syndrome of right wrist 02/28/2018  . Cerebral edema ([place]) 11/13/2018  . Cerebral infarction ([place])   . CHF (congestive heart failure) ([place])   . Cholecystitis 02/04/2019  . Chronic right hip pain   . DCM (dilated cardiomyopathy) ([place])   . Dysphagia, post-stroke   . Elevated troponin   . Entrapment of right ulnar nerve 02/28/2018  . Headache due to intracranial disease 11/14/2018  . Hepatitis C   . History of hemorrhagic stroke with residual hemiparesis ([place]) 02/04/2019  . HTN (hypertension) 08/14/2016  . Hyperlipidemia LDL goal <70 11/13/2018  . Hypertension   . ICD (implantable cardioverter-defibrillator) in place 09/13/2016  . Impotence due to erectile dysfunction 09/30/2017  . Ischemic cardiomyopathy   . Labile blood glucose   . Left leg DVT ([place]) 02/04/2019  . Marijuana user 11/13/2018  . Paroxysmal atrial fibrillation ([name])   . Right middle cerebral artery stroke ([place]) 11/13/2018  . Solitary pulmonary nodule 06/10/2017   5 mm RUL nodule noted incidentally as part of CVA workup 08/2016. With smoking history would obtain low-dose CT scan 08/2017.   . Stroke (cerebrum) ([name]) Lg L MCA infarct w/ hemorrhagic conversion, embolic d/t AF 5/39/7673  . Stroke ([place])   . Trochanteric bursitis, right hip 11/14/2018  . Visit for monitoring Tikosyn therapy 03/26/2017    Past Surgical History:  Procedure Laterality Date  . CARDIAC DEFIBRILLATOR PLACEMENT  2015  . CARDIOVERSION N/A 10/10/2016   Procedure: CARDIOVERSION;   Surgeon: Dorothy Spark, MD;  Location: [place];  Service: Cardiovascular;  Laterality: N/A;  . CARDIOVERSION N/A 03/27/2017   Procedure: CARDIOVERSION;  Surgeon: Jerline Pain, MD;  Location[facility] ENDOSCOPY;  Service: Cardiovascular;  Laterality: N/A;  . CARDIOVERSION N/A 10/29/2018   Procedure: CARDIOVERSION;  Surgeon: Sanda Klein, MD;  Location: [place] ENDOSCOPY;  Service: Cardiovascular;  Laterality: N/A;  . CARDIOVERSION N/A 11/05/2018   Procedure: CARDIOVERSION;  Surgeon: Acie Fredrickson Wonda Cheng, MD;  Location: [place];  Service: Cardiovascular;  Laterality: N/A;  . EYE SURGERY Left 1990  . IR EXCHANGE BILIARY DRAIN  02/11/2019  . IR IVC FILTER PLMT / S&I /IMG GUID/MOD SED  12/04/2018  . IR PERC CHOLECYSTOSTOMY  12/13/2018  . IR PERCUTANEOUS ART THROMBECTOMY/INFUSION INTRACRANIAL INC DIAG ANGIO  09/05/2016  . IR RADIOLOGIST EVAL & MGMT  10/03/2016  . RADIOLOGY WITH ANESTHESIA N/A 09/05/2016   Procedure: RADIOLOGY WITH ANESTHESIA;  Surgeon: Luanne Bras, MD;  Location: [place];  Service: Radiology;  Laterality: N/A;  . RIGHT/LEFT HEART CATH AND CORONARY ANGIOGRAPHY N/A 11/03/2018   Procedure: RIGHT/LEFT HEART CATH AND CORONARY ANGIOGRAPHY;  Surgeon: Lorretta Harp, MD;  Location: [place] CV LAB;  Service: Cardiovascular;  Laterality: N/A;  . TEE WITHOUT CARDIOVERSION N/A 02/06/2019   Procedure: TRANSESOPHAGEAL ECHOCARDIOGRAM (TEE);  Surgeon: Skeet Latch, MD;  Location: [facility] ENDOSCOPY;  Service: Cardiovascular;  Laterality: N/A;    Current Outpatient Medications  Medication Sig Dispense Refill  . acetaminophen (TYLENOL) 325 MG tablet Take 2 tablets (650 mg total) by mouth every 4 (four) hours as needed for mild pain (or temp > 37.5 C (99.5 F)).    Marland Kitchen atorvastatin (LIPITOR) 20 MG tablet TAKE 1 TABLET(20 MG) BY MOUTH DAILY AT 6 PM 90 tablet 0  . calcium carbonate (TUMS - DOSED IN MG ELEMENTAL CALCIUM) 500 MG chewable tablet Chew 1 tablet (200 mg of elemental calcium total) by  mouth daily as needed for indigestion or heartburn. 30 tablet 1  . dabigatran ([name]) 150 MG CAPS capsule Take 1 capsule (150 mg total) by mouth 2 (two) times daily. 60 capsule 0  . digoxin (LANOXIN) 0.125 MG tablet Take 1 tablet (0.125 mg total) by mouth daily. 90 tablet 1  . furosemide (LASIX) 40 MG tablet Take 1 tablet (40 mg total) by mouth daily. 90 tablet 1  . gabapentin (NEURONTIN) 600 MG tablet Take 1 tablet (600 mg total) by mouth 3 (three) times daily. 60 tablet 0  . ipratropium (ATROVENT HFA) 17 MCG/ACT inhaler Inhale 2 puffs into the lungs every 6 (six) hours as needed for wheezing. 2 Inhaler 0  . levalbuterol (XOPENEX HFA) 45 MCG/ACT inhaler Inhale 1-2 puffs into the lungs every 4 (four) hours as needed for wheezing. 2 Inhaler 0  . metoprolol succinate (TOPROL-XL) 100 MG 24 hr tablet TAKE 1 TABLET(100 MG) BY MOUTH DAILY WITH OR IMMEDIATELY FOLLOWING A MEAL 90 tablet 0  . pantoprazole (PROTONIX) 40 MG tablet TAKE 1 TABLET(40 MG) BY MOUTH DAILY 90 tablet 0  . potassium chloride SA (KLOR-CON) 20 MEQ tablet TAKE 1 TABLET(20 MEQ) BY MOUTH DAILY 90 tablet 0  . sacubitril-valsartan (ENTRESTO) 24-26 MG Take 1 tablet by mouth 2 (two) times daily. 60 tablet 0  . spironolactone (ALDACTONE) 25 MG tablet TAKE 1 TABLET(25 MG) BY MOUTH DAILY 90 tablet 0  . traMADol (ULTRAM) 50 MG tablet Take 1 tablet (50 mg total) by mouth every 8 (eight) hours as needed for severe pain. 30 tablet 0   No current facility-administered medications for this visit.    Allergies:   Benadryl [diphenhydramine]   Social History:  The patient  reports that he has quit smoking. His smoking use included cigarettes. He smoked 0.50 packs per day. He has never used smokeless tobacco. He reports previous alcohol use of about 3.0 standard drinks of alcohol per week. He reports previous drug use. Frequency: 2.00 times per week. Drug: Marijuana.   Family History:  The patient's family history includes High blood pressure in his  father and mother; Stroke in his maternal aunt.  ROS:  Please see the history of present illness.  All other systems are reviewed and otherwise negative.   PHYSICAL EXAM:  VS:  BP 110/62   Pulse (!) 134   Ht 5\' 7"  (1.702 m)   Wt 175 lb (79.4 kg)   BMI 27.41 kg/m  BMI: Body mass index is 27.41 kg/m. Well nourished, well developed, in no acute distress, looks older then his age 66: normocephalic, atraumatic  Neck: no JVD, carotid bruits or masses Cardiac:  irreg-irreg, tachycardic, 2/6 SM, no rubs, or gallops Lungs:  CTA b/l, no wheezing, rhonchi or rales  Abd:  soft, nontender MS: no deformity, advanced atrophy  Ext: 1-2 edema to mid shin b/l Skin: warm and dry, no rash Neuro:  No gross deficits appreciated Psych: euthymic mood, full affect  ICD site is stable, no tethering or discomfort   EKG:  Done today and reviewed by myself AFib 134, no ST changes  ICD interrogation done today by industry and reviewed by myself  Battery and lead measurements look good 5% VP 17K NSVT episodes Given the number the events have rolled over, available EGMs were reviewed and are rapid AFib none of the RVR have been of duration that would get him to get therapies No morphology change with these rates, not felt to be true NSVT Unable to tell when or for how long this has been going on   02/06/2019: TEE LVEF 15-20%.  Global hypokinesis RV systolic function mildly reduced  Mild MR and PR. Moderate AR Moderate to severe TR Small mobile thrombus in the left atrial appendage tip No vegetation or thrombus noted on PPM leads  Cardioversion was not performed due to LAA thrombus. For additional details see full report.    TTE 12/07/2018 1. Left ventricular ejection fraction, by visual estimation, is 20%. The left ventricle has severely decreased function. Mildly increased left ventricular size. There is mildly increased left ventricular hypertrophy. 2. Left ventricular diastolic  Doppler parameters are indeterminate in the setting of rapid atrial fibrillation. 3. Global right ventricle has mildly reduced systolic function.The right ventricular size is normal. No increase in right ventricular wall thickness. 4. Left atrial size was mild-moderately dilated. 5. Right atrial size was normal. 6. Trivial pericardial effusion is present. 7. The pericardial effusion is posterior to the left ventricle. 8. Mild aortic valve annular calcification. 9. The mitral valve is grossly normal. Mild mitral valve regurgitation. 10. The tricuspid valve is grossly normal. Tricuspid valve regurgitation mild-moderate. 11. The aortic valve is tricuspid Aortic valve regurgitation is mild to moderate by color flow Doppler. 12. The pulmonic valve was grossly normal. Pulmonic valve regurgitation is mild by color flow Doppler. 13. Moderately elevated pulmonary artery systolic pressure. 14. The inferior vena cava is normal in size with greater than 50% respiratory variability, suggesting right atrial pressure of 3 mmHg. 15. A device wire is visualized. 16. The tricuspid regurgitant velocity is 3.68 m/s, and with an assumed right atrial pressure of 3 mmHg, the estimated right ventricular systolic pressure is moderately elevated at 57.2 mmHg. 17. No obvious vegetations involving valves or device wire.   Cardiac Cath 11/03/18 Mr. Enis has clean coronary arteries, elevated filling pressures consistent with a nonischemic cardiomyopathy with acute on chronic systolic heart failure. He is in A. fib with RVR. He will need pharmacologic optimization and initiation of diuretic therapy for LVEDP and elevated filling pressures. He already has an ICD in place. The sheath was removed and a TR band was placed on the right wrist to achieve patent hemostasis. The Swan-Ganz catheter was removed as well as the antecubital sheath. The patient left lab in stable condition. Dr. Fransico Him was notified of  these results.  Echo 10/30/18  1. The left ventricle has severely reduced systolic function, with an ejection fraction of 15-20%. The cavity size was moderately dilated. There is moderate concentric left ventricular hypertrophy. Left ventricular diastolic function could not be  evaluated secondary to atrial fibrillation. Left ventricular diffuse hypokinesis. 2. The right ventricle has moderately reduced systolic function. The cavity was moderately enlarged. There is no increase in right ventricular wall thickness.  Right ventricular systolic pressure is moderately elevated with an estimated pressure of 45.5  mmHg. 3. Left atrial size was moderately dilated. 4. Right atrial size was severely dilated. 5. Tricuspid valve regurgitation is severe. 6. Mild thickening of the aortic valve. Aortic valve regurgitation is moderate by color flow Doppler. 7. The aorta is normal unless otherwise noted.    Recent Labs: 10/31/2018: TSH 2.566 02/03/2019: B Natriuretic Peptide 922.2 02/06/2019: Magnesium 2.0 02/17/2019: ALT 16; Hemoglobin 12.8; Platelets 317 03/25/2019: BUN 9; Creatinine, Ser 1.10; Potassium 5.2; Sodium 142  11/11/2018: Cholesterol 135; HDL 24; LDL Cholesterol 96; Total CHOL/HDL Ratio 5.6; Triglycerides 75; VLDL 15   CrCl cannot be calculated (Patient's most recent lab result is older than the maximum 21 days allowed.).   Wt Readings from Last 3 Encounters:  05/12/19 175 lb (79.4 kg)  03/25/19 176 lb 9.6 oz (80.1 kg)  03/12/19 172 lb (78 kg)     Other studies reviewed: Additional studies/records reviewed today include: summarized above  ASSESSMENT AND PLAN:  1. ICD     Intact function  2. NICM 3. Chronic CHF (sysolic)     He has edema today, lungs are clear, not SOB here today     As below  4. Persistent AFib     CHA2DS2Vasc is 5, on [name]      RVR today, no way via his device how long.  He suspects he has been in AFib a couple days, though left the hospital in Afib  back in Dec. He is not in any distress, not SOB here today Unclear why he has not been taking his metoprolol or dig, he tells me thathe takes what the pharmacy fills and neither of these two have been included.   I have discussed briefly with DOD today, given he is not in any distress, not SOB here Resume/start metoprolol succ 100mg  daily  Start dig 0.125mg  daily Increase his lasix to 40mg  daily BMET today and an office visit and BMET in 1 week  Discussed if he has any escalation/worseining of any symptoms to seek attention.   I will ask Dr. [name] wether or not he feels repeat TEE is needed prior to [place] (he has had 2 strokes) The patient reports compliance with his pradaxa (though historically has been known not to be compliant. Do we revisit AAD Tikosyn again?     Disposition: F/u as above  Current medicines are reviewed at length with the patient today.  The patient did not have any concerns regarding medicines.  Venetia Night, PA-C 05/12/2019 12:46 PM     [place] [id number] [phone] (office)  [phone] (fax)

## 2019-05-11 NOTE — Telephone Encounter (Signed)
Patient scheduled to see Tommye Standard, PA-C, on 05/12/19.

## 2019-05-12 ENCOUNTER — Ambulatory Visit (INDEPENDENT_AMBULATORY_CARE_PROVIDER_SITE_OTHER): Payer: Medicare (Managed Care) | Admitting: Physician Assistant

## 2019-05-12 ENCOUNTER — Other Ambulatory Visit: Payer: Self-pay

## 2019-05-12 VITALS — BP 110/62 | HR 134 | Ht 67.0 in | Wt 175.0 lb

## 2019-05-12 DIAGNOSIS — Z95810 Presence of automatic (implantable) cardiac defibrillator: Secondary | ICD-10-CM

## 2019-05-12 DIAGNOSIS — I428 Other cardiomyopathies: Secondary | ICD-10-CM | POA: Diagnosis not present

## 2019-05-12 DIAGNOSIS — I4819 Other persistent atrial fibrillation: Secondary | ICD-10-CM | POA: Diagnosis not present

## 2019-05-12 DIAGNOSIS — I5023 Acute on chronic systolic (congestive) heart failure: Secondary | ICD-10-CM

## 2019-05-12 MED ORDER — FUROSEMIDE 40 MG PO TABS: 40.0000 mg | ORAL_TABLET | Freq: Every day | ORAL | 1 refills | Status: DC

## 2019-05-12 MED ORDER — DIGOXIN 125 MCG PO TABS: 0.1250 mg | ORAL_TABLET | Freq: Every day | ORAL | 1 refills | Status: DC

## 2019-05-12 NOTE — Patient Instructions (Addendum)
Medication Instructions:   START TAKING :   1. DIGOXIN 0.125 MG ONCE A DAY   2. CONTINUE TO TAKE METOPROLOL 100 MG ONCE A DAY   3. TAKE LASIX 40 MG ONCE A DAY  *If you need a refill on your cardiac medications before your next appointment, please call your pharmacy*   Lab Work:  BMET AND RETURN FOR BMET IN ONE WEEK  (  [facility] )  If you have labs (blood work) drawn today and your tests are completely normal, you will receive your results only by: Marland Kitchen MyChart Message (if you have MyChart) OR . A paper copy in the mail If you have any lab test that is abnormal or we need to change your treatment, we will call you to review the results.   Testing/Procedures:  NONE ORDERED  TODAY   Follow-Up: At [facility], you and your health needs are our priority.  As part of our continuing mission to provide you with exceptional heart care, we have created designated Provider Care Teams.  These Care Teams include your primary Cardiologist (physician) and Advanced Practice Providers (APPs -  Physician Assistants and Nurse Practitioners) who all work together to provide you with the care you need, when you need it.  We recommend signing up for the patient portal called "MyChart".  Sign up information is provided on this After Visit Summary.  MyChart is used to connect with patients for Virtual Visits (Telemedicine).  Patients are able to view lab/test results, encounter notes, upcoming appointments, etc.  Non-urgent messages can be sent to your provider as well.   To learn more about what you can do with MyChart, go to [email or web address].    Your next appointment:   1 week(s)  The format for your next appointment:   In Person  Provider:   You may see  Dr [name]  or one of the following Advanced Practice Providers on your designated Care Team:    Chanetta Marshall, NP  Tommye Standard, PA-C  Legrand Como "Jonni Sanger" Tool, [place]    Other Instructions    FOLLOW UP [place]

## 2019-05-13 LAB — BASIC METABOLIC PANEL
BUN/Creatinine Ratio: 9 — ABNORMAL LOW (ref 10–24)
BUN: 11 mg/dL (ref 8–27)
CO2: 23 mmol/L (ref 20–29)
Calcium: 9.4 mg/dL (ref 8.6–10.2)
Chloride: 108 mmol/L — ABNORMAL HIGH (ref 96–106)
Creatinine, Ser: 1.18 mg/dL (ref 0.76–1.27)
GFR calc Af Amer: 73 mL/min/{1.73_m2} (ref >59)
GFR calc non Af Amer: 63 mL/min/{1.73_m2} (ref >59)
Glucose: 86 mg/dL (ref 65–99)
Potassium: 4.8 mmol/L (ref 3.5–5.2)
Sodium: 144 mmol/L (ref 134–144)

## 2019-05-19 ENCOUNTER — Other Ambulatory Visit: Payer: Medicare (Managed Care)

## 2019-05-19 ENCOUNTER — Encounter: Payer: Medicare (Managed Care) | Admitting: Student

## 2019-05-21 ENCOUNTER — Encounter: Payer: Medicare (Managed Care) | Admitting: Student

## 2019-05-21 ENCOUNTER — Other Ambulatory Visit: Payer: Medicare (Managed Care)

## 2019-05-21 NOTE — Progress Notes (Deleted)
Electrophysiology Office Note Date: 05/21/2019  ID:  Glenn [name], DOB 1951/01/18, MRN [id number]  PCP: Glenn Dawn, MD Primary Cardiologist: Glenn Axe, MD Electrophysiologist: Dr. Caryl [name]   CC: Routine ICD follow-up  Glenn [name] is a 69 y.o. male seen today for Dr. Caryl [name].  They present today for 1 week follow up due to AF RVR and med non-compliance.     He has had a recent string of complicated admissions for CHF, COPD, and CVA as below.  Admitted 10/30/2018  COPD, CHF exacerbations, notes report medication non compliance including his  And PAFib RVR LVEF 15-20%, clean cath Discharged 11/06/2018  Admitted 11/10/2018 Stroke, large territory subacute hemorrhagic infarctin theright MCA territory Cardiology consulted, found in AFlutter 120's Neurology noted Large R MCA infarct with hemorrhagic conversion, embolic secondary to known AF on Eliquis s/p reversal w/ Kcentra Rate controlled,  Neurology: Future [name] regimen once hemorrhagic conversion resolves Discharged to [name] 11/13/2018 Discharged from [facility] 12/02/2018 and Readmitted to in-patient Acute care  Febrile, rapid AFib , suspect sepsis, abd pain, respiratory distress amio gtt used for rate control  (given severe LV dysfunction avoiding CCB and failed BB historically) Found with extensive DVT and IVC filter placed sepsis felt 2/2 aspiration pneumonia and acute cholecystitis >> drain placed Blood cultures  11/25/2018: nagative x2 (5 days) 12/02/2018: negative x2 (5 days) 12/05/2018: one of 2 + for coag neg staph species (the other neg x5 days) Cardiology followed, was rate controlled at discharge, observed to have frequent PVCs Discharged on 12/24/2018 to SNF On pradaxa (though apparently not taking at home from the SNF  Admitted 02/04/2019 SOB, rapid AFib, CHF Started on amio gtt EP consulted rec TEE/DCCV TEE noted LVEF 15-20%, LA thrombus and no DCCV completed Discharged  02/07/2019 pradaxa dig Metoprolol Entresto Aldactone  He had AFib clnic follow up 12/29, was in rate controlled AFib, planned to discuss with EP, consideration for repeat echo/study vs proceed with DCCV after 6-8 weeks of uninterrupted a/c  Planned to have a 67mo follow in the clinic (not done yet)  05/08/2019 Device clinic note: Scheduled remote received that showed 2450 NSVT episodes , 6 VT-1 episodes with no therapy . Some episodes appear to be possible AF RVR. Known PAF, + [name] and Toprol XL 100 mg po Scheduled for f/u  Since last being seen in our clinic, the patient reports doing very well. They deny chest pain, palpitations, dyspnea, PND, orthopnea, nausea, vomiting, dizziness, syncope, edema, weight gain, or early satiety.  {He/she (caps):30048} has not had ICD shocks.   Device History: Research officer, political party ICD implanted 12/2013 for NICM History of appropriate therapy: No History of AAD therapy: Yes   Past Medical History:  Diagnosis Date  . Acute blood loss anemia   . Acute CVA (cerebrovascular accident) ([place])   . Acute deep vein thrombosis (DVT) of both lower extremities ([name])   . Acute kidney injury ([place])   . Acute on chronic combined systolic and diastolic CHF (congestive heart failure) ([place])   . Acute renal failure superimposed on stage 3a chronic kidney disease ([place])   . Acute respiratory failure with hypoxia ([place])   . Atrial fibrillation ([place])   . Carpal tunnel syndrome of right wrist 02/28/2018  . Cerebral edema ([place]) 11/13/2018  . Cerebral infarction ([place])   . CHF (congestive heart failure) ([place])   . Cholecystitis 02/04/2019  . Chronic right hip pain   . DCM (dilated cardiomyopathy) ([place])   . Dysphagia, post-stroke   .  Elevated troponin   . Entrapment of right ulnar nerve 02/28/2018  . Headache due to intracranial disease 11/14/2018  . Hepatitis C   . History of hemorrhagic stroke with residual hemiparesis ([place]) 02/04/2019  . HTN (hypertension)  08/14/2016  . Hyperlipidemia LDL goal <70 11/13/2018  . Hypertension   . ICD (implantable cardioverter-defibrillator) in place 09/13/2016  . Impotence due to erectile dysfunction 09/30/2017  . Ischemic cardiomyopathy   . Labile blood glucose   . Left leg DVT ([place]) 02/04/2019  . Marijuana user 11/13/2018  . Paroxysmal atrial fibrillation ([name])   . Right middle cerebral artery stroke ([place]) 11/13/2018  . Solitary pulmonary nodule 06/10/2017   5 mm RUL nodule noted incidentally as part of CVA workup 08/2016. With smoking history would obtain low-dose CT scan 08/2017.   . Stroke (cerebrum) ([name]) Lg L MCA infarct w/ hemorrhagic conversion, embolic d/t AF 8/92/1194  . Stroke ([place])   . Trochanteric bursitis, right hip 11/14/2018  . Visit for monitoring Tikosyn therapy 03/26/2017   Past Surgical History:  Procedure Laterality Date  . CARDIAC DEFIBRILLATOR PLACEMENT  2015  . CARDIOVERSION N/A 10/10/2016   Procedure: CARDIOVERSION;  Surgeon: Dorothy Spark, MD;  Location: [facility];  Service: Cardiovascular;  Laterality: N/A;  . CARDIOVERSION N/A 03/27/2017   Procedure: CARDIOVERSION;  Surgeon: Jerline Pain, MD;  Location: [name];  Service: Cardiovascular;  Laterality: N/A;  . CARDIOVERSION N/A 10/29/2018   Procedure: CARDIOVERSION;  Surgeon: Sanda Klein, MD;  Location: [facility];  Service: Cardiovascular;  Laterality: N/A;  . CARDIOVERSION N/A 11/05/2018   Procedure: CARDIOVERSION;  Surgeon: Acie Fredrickson Wonda Cheng, MD;  Location: [facility];  Service: Cardiovascular;  Laterality: N/A;  . EYE SURGERY Left 1990  . IR EXCHANGE BILIARY DRAIN  02/11/2019  . IR IVC FILTER PLMT / S&I /IMG GUID/MOD SED  12/04/2018  . IR PERC CHOLECYSTOSTOMY  12/13/2018  . IR PERCUTANEOUS ART THROMBECTOMY/INFUSION INTRACRANIAL INC DIAG ANGIO  09/05/2016  . IR RADIOLOGIST EVAL & MGMT  10/03/2016  . RADIOLOGY WITH ANESTHESIA N/A 09/05/2016   Procedure: RADIOLOGY WITH ANESTHESIA;  Surgeon: Luanne Bras, MD;   Location: [facility];  Service: Radiology;  Laterality: N/A;  . RIGHT/LEFT HEART CATH AND CORONARY ANGIOGRAPHY N/A 11/03/2018   Procedure: RIGHT/LEFT HEART CATH AND CORONARY ANGIOGRAPHY;  Surgeon: Lorretta Harp, MD;  Location: [facility];  Service: Cardiovascular;  Laterality: N/A;  . TEE WITHOUT CARDIOVERSION N/A 02/06/2019   Procedure: TRANSESOPHAGEAL ECHOCARDIOGRAM (TEE);  Surgeon: Skeet Latch, MD;  Location: [facility];  Service: Cardiovascular;  Laterality: N/A;    Current Outpatient Medications  Medication Sig Dispense Refill  . acetaminophen (TYLENOL) 325 MG tablet Take 2 tablets (650 mg total) by mouth every 4 (four) hours as needed for mild pain (or temp > 37.5 C (99.5 F)).    Marland Kitchen atorvastatin (LIPITOR) 20 MG tablet TAKE 1 TABLET(20 MG) BY MOUTH DAILY AT 6 PM 90 tablet 0  . calcium carbonate (TUMS - DOSED IN MG ELEMENTAL CALCIUM) 500 MG chewable tablet Chew 1 tablet (200 mg of elemental calcium total) by mouth daily as needed for indigestion or heartburn. 30 tablet 1  . dabigatran ([name]) 150 MG CAPS capsule Take 1 capsule (150 mg total) by mouth 2 (two) times daily. 60 capsule 0  . digoxin (LANOXIN) 0.125 MG tablet Take 1 tablet (0.125 mg total) by mouth daily. 90 tablet 1  . furosemide (LASIX) 40 MG tablet Take 1 tablet (40 mg total) by mouth daily. 90 tablet 1  .  gabapentin (NEURONTIN) 600 MG tablet Take 1 tablet (600 mg total) by mouth 3 (three) times daily. 60 tablet 0  . ipratropium (ATROVENT HFA) 17 MCG/ACT inhaler Inhale 2 puffs into the lungs every 6 (six) hours as needed for wheezing. 2 Inhaler 0  . levalbuterol (XOPENEX HFA) 45 MCG/ACT inhaler Inhale 1-2 puffs into the lungs every 4 (four) hours as needed for wheezing. 2 Inhaler 0  . metoprolol succinate (TOPROL-XL) 100 MG 24 hr tablet TAKE 1 TABLET(100 MG) BY MOUTH DAILY WITH OR IMMEDIATELY FOLLOWING A MEAL 90 tablet 0  . pantoprazole (PROTONIX) 40 MG tablet TAKE 1 TABLET(40 MG) BY MOUTH DAILY 90 tablet 0  .  potassium chloride SA (KLOR-CON) 20 MEQ tablet TAKE 1 TABLET(20 MEQ) BY MOUTH DAILY 90 tablet 0  . sacubitril-valsartan (ENTRESTO) 24-26 MG Take 1 tablet by mouth 2 (two) times daily. 60 tablet 0  . spironolactone (ALDACTONE) 25 MG tablet TAKE 1 TABLET(25 MG) BY MOUTH DAILY 90 tablet 0  . traMADol (ULTRAM) 50 MG tablet Take 1 tablet (50 mg total) by mouth every 8 (eight) hours as needed for severe pain. 30 tablet 0   No current facility-administered medications for this visit.    Allergies:   Benadryl [diphenhydramine]   Social History: Social History   Socioeconomic History  . Marital status: Single    Spouse name: Not on file  . Number of children: Not on file  . Years of education: 35 (some college)  . Highest education level: Not on file  Occupational History  . Occupation: disability  Tobacco Use  . Smoking status: Former Smoker    Packs/day: 0.50    Types: Cigarettes  . Smokeless tobacco: Never Used  . Tobacco comment: a pack last three days  Substance and Sexual Activity  . Alcohol use: Not Currently    Alcohol/week: 3.0 standard drinks    Types: 3 Cans of beer per week    Comment: pt stop drinking   . Drug use: Not Currently    Frequency: 2.0 times per week    Types: Marijuana    Comment: stop smoking   . Sexual activity: Yes    Partners: Female    Birth control/protection: Condom  Other Topics Concern  . Not on file  Social History Narrative  . Not on file   Social Determinants of Health   Financial Resource Strain:   . Difficulty of Paying Living Expenses:   Food Insecurity:   . Worried About Charity fundraiser in the Last Year:   . Arboriculturist in the Last Year:   Transportation Needs:   . Film/video editor (Medical):   Marland Kitchen Lack of Transportation (Non-Medical):   Physical Activity:   . Days of Exercise per Week:   . Minutes of Exercise per Session:   Stress:   . Feeling of Stress :   Social Connections:   . Frequency of Communication with  Friends and Family:   . Frequency of Social Gatherings with Friends and Family:   . Attends Religious Services:   . Active Member of Clubs or Organizations:   . Attends Archivist Meetings:   Marland Kitchen Marital Status:   Intimate Partner Violence:   . Fear of Current or Ex-Partner:   . Emotionally Abused:   Marland Kitchen Physically Abused:   . Sexually Abused:     Family History: Family History  Problem Relation Age of Onset  . High blood pressure Mother   . High blood pressure Father   .  Stroke Maternal Aunt   . Heart disease Neg Hx     Review of Systems: All other systems reviewed and are otherwise negative except as noted above.   Physical Exam: There were no vitals filed for this visit.   GEN- The patient is well appearing, alert and oriented x 3 today.   HEENT: normocephalic, atraumatic; sclera clear, conjunctiva pink; hearing intact; oropharynx clear; neck supple, no JVP Lymph- no cervical lymphadenopathy Lungs- Clear to ausculation bilaterally, normal work of breathing.  No wheezes, rales, rhonchi Heart- Regular rate and rhythm, no murmurs, rubs or gallops, PMI not laterally displaced GI- soft, non-tender, non-distended, bowel sounds present, no hepatosplenomegaly Extremities- no clubbing, cyanosis, or edema; DP/PT/radial pulses 2+ bilaterally MS- no significant deformity or atrophy Skin- warm and dry, no rash or lesion; ICD pocket well healed Psych- euthymic mood, full affect Neuro- strength and sensation are intact  ICD interrogation- reviewed in detail today,  See PACEART report  EKG:  EKG is not ordered today.  Recent Labs: 10/31/2018: TSH 2.566 02/03/2019: B Natriuretic Peptide 922.2 02/06/2019: Magnesium 2.0 02/17/2019: ALT 16; Hemoglobin 12.8; Platelets 317 05/12/2019: BUN 11; Creatinine, Ser 1.18; Potassium 4.8; Sodium 144   Wt Readings from Last 3 Encounters:  05/12/19 175 lb (79.4 kg)  03/25/19 176 lb 9.6 oz (80.1 kg)  03/12/19 172 lb (78 kg)     Other  studies Reviewed: Additional studies/ records that were reviewed today include: ***   Assessment and Plan:  1.  Acute on chronic systolic heart failure s/p [employer] single chamber ICD  *** today in the setting of persistent AF Stable on an appropriate medical regimen Normal ICD function See Pace Art report No changes today  2. Persistent AF CHA2DS2VASC of at least 5 on [name].   Restarted on Toprol 100 mg daily last week Continue digoxin 0.125 mg daily. Check level today.    Current medicines are reviewed at length with the patient today.   The patient does not have concerns regarding his medicines.  The following changes were made today:  {NONE DEFAULTED:18576::"none"}  Labs/ tests ordered today include: *** No orders of the defined types were placed in this encounter.    Disposition:   Follow up with *** {gen number 1-19:417408} {TIME; UNITS DAY/WEEK/MONTH:19136}   Signed, [name], PA-C  05/21/2019 9:21 AM  [name] [address] [place]  [address] [phone] (office) [phone] (fax)

## 2019-05-25 NOTE — Progress Notes (Deleted)
Cardiology Office Note Date:  05/25/2019  Patient ID:  Glenn [name], DOB 19-Jan-1951, MRN [id number] PCP:  [name], MD  Cardiologist:  Dr. [name]     Chief Complaint: abnormal device remote  History of Present Illness: Glenn [name] is a 69 y.o. male with history of NICM, chronic CHF (systolic), ICD, Hep C, stroke (after missed doses of his Xarelto >> eliquis, recurrent stroke >>hemorrhagic stroke >> [name]), HTN  He has had a number of hospitalizations in the last several months  Admitted 10/30/2018  COPD, CHF exacerbations, notes report medication non compliance including his  And PAFib RVR LVEF 15-20%, clean cath Discharged 11/06/2018  Admitted 11/10/2018 Stroke, large territory subacute hemorrhagic infarct in the right MCA territory Cardiology consulted, found in AFlutter 120's Neurology noted  Large R MCA infarct with hemorrhagic conversion, embolic secondary to known AF on Eliquis s/p reversal w/ Kcentra Rate controlled,  Neurology: Future [facility] regimen once hemorrhagic conversion resolves Discharged to CIR 11/13/2018 Discharged from [place] 12/02/2018 and Readmitted to in-patient Acute care  Febrile, rapid AFib , suspect sepsis, abd pain, respiratory distress amio gtt used for rate control  (given severe LV dysfunction avoiding CCB and failed BB historically) Found with extensive DVT and IVC filter placed sepsis felt 2/2 aspiration pneumonia and acute cholecystitis >> drain placed Blood cultures  11/25/2018: nagative x2 (5 days) 12/02/2018: negative x2 (5 days) 12/05/2018: one of 2 + for coag neg staph species (the other neg x5 days) Cardiology followed, was rate controlled at discharge, observed to have frequent PVCs Discharged on 12/24/2018 to SNF On pradaxa (though apparently not taking at home from the SNF  Admitted 02/04/2019 SOB, rapid AFib, CHF Started on amio gtt EP consulted rec TEE/DCCV TEE noted LVEF 15-20%, LA thrombus and no DCCV  completed Discharged 02/07/2019 pradaxa dig Metoprolol Entresto Aldactone  He had AFib clnic follow up 12/29, was in rate controlled AFib, planned to discuss with EP, consideration for repeat echo/study vs proceed with DCCV after 6-8 weeks of uninterrupted a/c  Planned to have a 36mo follow in the clinic (not done yet)  05/08/2019 Device clinic note: Scheduled remote received that showed 2450 NSVT episodes , 6 VT-1 episodes with no therapy . Some episodes appear to be possible AF RVR. Known PAF, + [name] and Toprol XL 100 mg po Scheduled for f/u  Most recent labs 03/25/19 K+ 5.2 BUN/Creat 9/1.10   I saw him 05/12/19 He was doing "OK".  Since his stroke he has slowed, but is ambulatory, able to get around his home, able to care for himself, lives with his son.  He was in a wheelchair here, said when he is going longer distances, or standing for a longtime his legs tend to get tired, this since his stroke. He reports no overt palpitations, though thinks he has been in Afib probably a day or two, says when in Afib he feels generally with less energy.  He will occassionally feel a sticking/fleeting type CP, and some vague sense of dizziness, no near syncope or syncope.  He reported compliance with his pradaxa, no missed doses for weeks or months even.  He reported compliance with all of his medicines, except not taking metoprolol or digoxin, as far as he knows since his hospital discharge.   He was certain about this.  He noticed his feet swollen the day prior to his visit took a whole tab of his lasix, had not taken it yet that day.   No rest SOB, minimal  DOE at his baseline.  He reported some symptoms of orthopnea last night, though not routinely  He still had the GB drain in, waiting to hear back from the surgeon's office, is under the impression his PMD office is handling this.Marland Kitchen   He was in AFib 134bpm.  Suspect he had been in Afib since Dec, symptomatically te patient felt a couple  days. Resumed on his metoprolol and dig at 0.125mg  daily, increased his lasix to 40mg  dailyplanned for a 1 week follow up and labs, though looks like he cancelled the appt.  Discussed case with dr. [name], 1st strategy will be rate control alone, if failed consider AVN ablation and device upgrade to CRT  *** rate *** bleeding pradaxa *** volume *** BMET, dig level    Device information BSCi single chamber ICD, implanted 01/01/2014  AFib hx diagnosed +/- 2018  AAD Hx Tikosyn started discontinued after his hemorrhagic stroke and discontinuation of a/c Sept 2020  Past Medical History:  Diagnosis Date  . Acute blood loss anemia   . Acute CVA (cerebrovascular accident) ([place])   . Acute deep vein thrombosis (DVT) of both lower extremities ([name])   . Acute kidney injury ([place])   . Acute on chronic combined systolic and diastolic CHF (congestive heart failure) ([place])   . Acute renal failure superimposed on stage 3a chronic kidney disease ([place])   . Acute respiratory failure with hypoxia ([place])   . Atrial fibrillation ([employer])   . Carpal tunnel syndrome of right wrist 02/28/2018  . Cerebral edema ([place]) 11/13/2018  . Cerebral infarction ([place])   . CHF (congestive heart failure) ([place])   . Cholecystitis 02/04/2019  . Chronic right hip pain   . DCM (dilated cardiomyopathy) ([place])   . Dysphagia, post-stroke   . Elevated troponin   . Entrapment of right ulnar nerve 02/28/2018  . Headache due to intracranial disease 11/14/2018  . Hepatitis C   . History of hemorrhagic stroke with residual hemiparesis ([place]) 02/04/2019  . HTN (hypertension) 08/14/2016  . Hyperlipidemia LDL goal <70 11/13/2018  . Hypertension   . ICD (implantable cardioverter-defibrillator) in place 09/13/2016  . Impotence due to erectile dysfunction 09/30/2017  . Ischemic cardiomyopathy   . Labile blood glucose   . Left leg DVT ([place]) 02/04/2019  . Marijuana user 11/13/2018  . Paroxysmal atrial fibrillation ([name])   . Right middle  cerebral artery stroke ([place]) 11/13/2018  . Solitary pulmonary nodule 06/10/2017   5 mm RUL nodule noted incidentally as part of CVA workup 08/2016. With smoking history would obtain low-dose CT scan 08/2017.   . Stroke (cerebrum) ([name]) Lg L MCA infarct w/ hemorrhagic conversion, embolic d/t AF 2/95/1884  . Stroke ([place])   . Trochanteric bursitis, right hip 11/14/2018  . Visit for monitoring Tikosyn therapy 03/26/2017    Past Surgical History:  Procedure Laterality Date  . CARDIAC DEFIBRILLATOR PLACEMENT  2015  . CARDIOVERSION N/A 10/10/2016   Procedure: CARDIOVERSION;  Surgeon: Dorothy Spark, MD;  Location: [facility];  Service: Cardiovascular;  Laterality: N/A;  . CARDIOVERSION N/A 03/27/2017   Procedure: CARDIOVERSION;  Surgeon: Jerline Pain, MD;  Location: [facility] ENDOSCOPY;  Service: Cardiovascular;  Laterality: N/A;  . CARDIOVERSION N/A 10/29/2018   Procedure: CARDIOVERSION;  Surgeon: Sanda Klein, MD;  Location: [facility] ENDOSCOPY;  Service: Cardiovascular;  Laterality: N/A;  . CARDIOVERSION N/A 11/05/2018   Procedure: CARDIOVERSION;  Surgeon: Acie Fredrickson Wonda Cheng, MD;  Location: [facility];  Service: Cardiovascular;  Laterality: N/A;  . EYE SURGERY Left 1990  .  IR EXCHANGE BILIARY DRAIN  02/11/2019  . IR IVC FILTER PLMT / S&I /IMG GUID/MOD SED  12/04/2018  . IR PERC CHOLECYSTOSTOMY  12/13/2018  . IR PERCUTANEOUS ART THROMBECTOMY/INFUSION INTRACRANIAL INC DIAG ANGIO  09/05/2016  . IR RADIOLOGIST EVAL & MGMT  10/03/2016  . RADIOLOGY WITH ANESTHESIA N/A 09/05/2016   Procedure: RADIOLOGY WITH ANESTHESIA;  Surgeon: Luanne Bras, MD;  Location: [facility];  Service: Radiology;  Laterality: N/A;  . RIGHT/LEFT HEART CATH AND CORONARY ANGIOGRAPHY N/A 11/03/2018   Procedure: RIGHT/LEFT HEART CATH AND CORONARY ANGIOGRAPHY;  Surgeon: Lorretta Harp, MD;  Location: [facility] CV LAB;  Service: Cardiovascular;  Laterality: N/A;  . TEE WITHOUT CARDIOVERSION N/A 02/06/2019   Procedure: TRANSESOPHAGEAL  ECHOCARDIOGRAM (TEE);  Surgeon: Skeet Latch, MD;  Location: [facility] ENDOSCOPY;  Service: Cardiovascular;  Laterality: N/A;    Current Outpatient Medications  Medication Sig Dispense Refill  . acetaminophen (TYLENOL) 325 MG tablet Take 2 tablets (650 mg total) by mouth every 4 (four) hours as needed for mild pain (or temp > 37.5 C (99.5 F)).    Marland Kitchen atorvastatin (LIPITOR) 20 MG tablet TAKE 1 TABLET(20 MG) BY MOUTH DAILY AT 6 PM 90 tablet 0  . calcium carbonate (TUMS - DOSED IN MG ELEMENTAL CALCIUM) 500 MG chewable tablet Chew 1 tablet (200 mg of elemental calcium total) by mouth daily as needed for indigestion or heartburn. 30 tablet 1  . dabigatran ([name]) 150 MG CAPS capsule Take 1 capsule (150 mg total) by mouth 2 (two) times daily. 60 capsule 0  . digoxin (LANOXIN) 0.125 MG tablet Take 1 tablet (0.125 mg total) by mouth daily. 90 tablet 1  . furosemide (LASIX) 40 MG tablet Take 1 tablet (40 mg total) by mouth daily. 90 tablet 1  . gabapentin (NEURONTIN) 600 MG tablet Take 1 tablet (600 mg total) by mouth 3 (three) times daily. 60 tablet 0  . ipratropium (ATROVENT HFA) 17 MCG/ACT inhaler Inhale 2 puffs into the lungs every 6 (six) hours as needed for wheezing. 2 Inhaler 0  . levalbuterol (XOPENEX HFA) 45 MCG/ACT inhaler Inhale 1-2 puffs into the lungs every 4 (four) hours as needed for wheezing. 2 Inhaler 0  . metoprolol succinate (TOPROL-XL) 100 MG 24 hr tablet TAKE 1 TABLET(100 MG) BY MOUTH DAILY WITH OR IMMEDIATELY FOLLOWING A MEAL 90 tablet 0  . pantoprazole (PROTONIX) 40 MG tablet TAKE 1 TABLET(40 MG) BY MOUTH DAILY 90 tablet 0  . potassium chloride SA (KLOR-CON) 20 MEQ tablet TAKE 1 TABLET(20 MEQ) BY MOUTH DAILY 90 tablet 0  . sacubitril-valsartan (ENTRESTO) 24-26 MG Take 1 tablet by mouth 2 (two) times daily. 60 tablet 0  . spironolactone (ALDACTONE) 25 MG tablet TAKE 1 TABLET(25 MG) BY MOUTH DAILY 90 tablet 0  . traMADol (ULTRAM) 50 MG tablet Take 1 tablet (50 mg total) by mouth every 8  (eight) hours as needed for severe pain. 30 tablet 0   No current facility-administered medications for this visit.    Allergies:   Benadryl [diphenhydramine]   Social History:  The patient  reports that he has quit smoking. His smoking use included cigarettes. He smoked 0.50 packs per day. He has never used smokeless tobacco. He reports previous alcohol use of about 3.0 standard drinks of alcohol per week. He reports previous drug use. Frequency: 2.00 times per week. Drug: Marijuana.   Family History:  The patient's family history includes High blood pressure in his father and mother; Stroke in his maternal aunt.  ROS:  Please  see the history of present illness.  All other systems are reviewed and otherwise negative.   PHYSICAL EXAM:  VS:  There were no vitals taken for this visit. BMI: There is no height or weight on file to calculate BMI. Well nourished, well developed, in no acute distress, looks older then his age 66: normocephalic, atraumatic  Neck: no JVD, carotid bruits or masses Cardiac:  *** irreg-irreg, tachycardic, 2/6 SM, no rubs, or gallops Lungs:  *** CTA b/l, no wheezing, rhonchi or rales  Abd: soft, nontender MS: no deformity, advanced atrophy  Ext: *** edema to mid shin b/l Skin: warm and dry, no rash Neuro:  No gross deficits appreciated Psych: euthymic mood, full affect  *** ICD site is stable, no tethering or discomfort   EKG:  Done today and reviewed by myself ***  ICD interrogation  05/12/2019 Battery and lead measurements look good 5% VP 17K NSVT episodes Given the number the events have rolled over, available EGMs were reviewed and are rapid AFib none of the RVR have been of duration that would get him to get therapies No morphology change with these rates, not felt to be true NSVT Unable to tell when or for how long this has been going on   02/06/2019: TEE LVEF 15-20%.  Global hypokinesis RV systolic function mildly reduced  Mild MR and  PR. Moderate AR Moderate to severe TR Small mobile thrombus in the left atrial appendage tip No vegetation or thrombus noted on PPM leads  Cardioversion was not performed due to LAA thrombus. For additional details see full report.    TTE 12/07/2018 1. Left ventricular ejection fraction, by visual estimation, is 20%. The left ventricle has severely decreased function. Mildly increased left ventricular size. There is mildly increased left ventricular hypertrophy. 2. Left ventricular diastolic Doppler parameters are indeterminate in the setting of rapid atrial fibrillation. 3. Global right ventricle has mildly reduced systolic function.The right ventricular size is normal. No increase in right ventricular wall thickness. 4. Left atrial size was mild-moderately dilated. 5. Right atrial size was normal. 6. Trivial pericardial effusion is present. 7. The pericardial effusion is posterior to the left ventricle. 8. Mild aortic valve annular calcification. 9. The mitral valve is grossly normal. Mild mitral valve regurgitation. 10. The tricuspid valve is grossly normal. Tricuspid valve regurgitation mild-moderate. 11. The aortic valve is tricuspid Aortic valve regurgitation is mild to moderate by color flow Doppler. 12. The pulmonic valve was grossly normal. Pulmonic valve regurgitation is mild by color flow Doppler. 13. Moderately elevated pulmonary artery systolic pressure. 14. The inferior vena cava is normal in size with greater than 50% respiratory variability, suggesting right atrial pressure of 3 mmHg. 15. A device wire is visualized. 16. The tricuspid regurgitant velocity is 3.68 m/s, and with an assumed right atrial pressure of 3 mmHg, the estimated right ventricular systolic pressure is moderately elevated at 57.2 mmHg. 17. No obvious vegetations involving valves or device wire.   Cardiac Cath 11/03/18 Mr. Marquis has clean coronary arteries, elevated filling pressures  consistent with a nonischemic cardiomyopathy with acute on chronic systolic heart failure. He is in A. fib with RVR. He will need pharmacologic optimization and initiation of diuretic therapy for LVEDP and elevated filling pressures. He already has an ICD in place. The sheath was removed and a TR band was placed on the right wrist to achieve patent hemostasis. The Swan-Ganz catheter was removed as well as the antecubital sheath. The patient left lab in stable condition. Dr.  Fransico Him was notified of these results.  Echo 10/30/18  1. The left ventricle has severely reduced systolic function, with an ejection fraction of 15-20%. The cavity size was moderately dilated. There is moderate concentric left ventricular hypertrophy. Left ventricular diastolic function could not be  evaluated secondary to atrial fibrillation. Left ventricular diffuse hypokinesis. 2. The right ventricle has moderately reduced systolic function. The cavity was moderately enlarged. There is no increase in right ventricular wall thickness. Right ventricular systolic pressure is moderately elevated with an estimated pressure of 45.5  mmHg. 3. Left atrial size was moderately dilated. 4. Right atrial size was severely dilated. 5. Tricuspid valve regurgitation is severe. 6. Mild thickening of the aortic valve. Aortic valve regurgitation is moderate by color flow Doppler. 7. The aorta is normal unless otherwise noted.    Recent Labs: 10/31/2018: TSH 2.566 02/03/2019: B Natriuretic Peptide 922.2 02/06/2019: Magnesium 2.0 02/17/2019: ALT 16; Hemoglobin 12.8; Platelets 317 05/12/2019: BUN 11; Creatinine, Ser 1.18; Potassium 4.8; Sodium 144  11/11/2018: Cholesterol 135; HDL 24; LDL Cholesterol 96; Total CHOL/HDL Ratio 5.6; Triglycerides 75; VLDL 15   Estimated Creatinine Clearance: 61.3 mL/min (by C-G formula based on SCr of 1.18 mg/dL).   Wt Readings from Last 3 Encounters:  05/12/19 175 lb (79.4 kg)  03/25/19  176 lb 9.6 oz (80.1 kg)  03/12/19 172 lb (78 kg)     Other studies reviewed: Additional studies/records reviewed today include: summarized above  ASSESSMENT AND PLAN:  1. ICD     *** Intact function  2. NICM 3. Chronic CHF (sysolic)    ***  4. Persistent AFib     CHA2DS2Vasc is 5, on [name]     ***          Disposition: F/u as above  Current medicines are reviewed at length with the patient today.  The patient did not have any concerns regarding medicines.  Venetia Night, PA-C 05/25/2019 12:57 PM     [address] [address] [phone] (office)  [phone] (fax)

## 2019-05-27 ENCOUNTER — Encounter: Payer: Medicare (Managed Care) | Admitting: Physician Assistant

## 2019-06-01 ENCOUNTER — Other Ambulatory Visit: Payer: Self-pay

## 2019-06-01 ENCOUNTER — Emergency Department (HOSPITAL_COMMUNITY)
Admission: EM | Admit: 2019-06-01 | Discharge: 2019-06-01 | Disposition: A | Discharge status: Home / Self Care | Payer: Medicare (Managed Care) | Attending: Emergency Medicine | Admitting: Emergency Medicine

## 2019-06-01 ENCOUNTER — Ambulatory Visit (INDEPENDENT_AMBULATORY_CARE_PROVIDER_SITE_OTHER): Payer: Medicare (Managed Care) | Admitting: *Deleted

## 2019-06-01 ENCOUNTER — Encounter (HOSPITAL_COMMUNITY): Payer: Self-pay | Admitting: Emergency Medicine

## 2019-06-01 ENCOUNTER — Emergency Department (HOSPITAL_COMMUNITY): Payer: Medicare (Managed Care)

## 2019-06-01 DIAGNOSIS — Z7901 Long term (current) use of anticoagulants: Secondary | ICD-10-CM | POA: Diagnosis not present

## 2019-06-01 DIAGNOSIS — B182 Chronic viral hepatitis C: Secondary | ICD-10-CM | POA: Diagnosis not present

## 2019-06-01 DIAGNOSIS — I5042 Chronic combined systolic (congestive) and diastolic (congestive) heart failure: Secondary | ICD-10-CM | POA: Insufficient documentation

## 2019-06-01 DIAGNOSIS — Z95811 Presence of heart assist device: Secondary | ICD-10-CM | POA: Diagnosis not present

## 2019-06-01 DIAGNOSIS — I482 Chronic atrial fibrillation, unspecified: Secondary | ICD-10-CM

## 2019-06-01 DIAGNOSIS — Z79899 Other long term (current) drug therapy: Secondary | ICD-10-CM | POA: Insufficient documentation

## 2019-06-01 DIAGNOSIS — Z86718 Personal history of other venous thrombosis and embolism: Secondary | ICD-10-CM | POA: Insufficient documentation

## 2019-06-01 DIAGNOSIS — I4891 Unspecified atrial fibrillation: Secondary | ICD-10-CM

## 2019-06-01 DIAGNOSIS — I110 Hypertensive heart disease with heart failure: Secondary | ICD-10-CM | POA: Insufficient documentation

## 2019-06-01 DIAGNOSIS — R0602 Shortness of breath: Secondary | ICD-10-CM | POA: Diagnosis present

## 2019-06-01 DIAGNOSIS — I5023 Acute on chronic systolic (congestive) heart failure: Secondary | ICD-10-CM

## 2019-06-01 DIAGNOSIS — Z87891 Personal history of nicotine dependence: Secondary | ICD-10-CM | POA: Diagnosis not present

## 2019-06-01 DIAGNOSIS — I420 Dilated cardiomyopathy: Secondary | ICD-10-CM

## 2019-06-01 LAB — CBC WITH DIFFERENTIAL/PLATELET
Abs Immature Granulocytes: 0.01 10*3/uL (ref 0.00–0.07)
Basophils Absolute: 0 10*3/uL (ref 0.0–0.1)
Basophils Relative: 0 %
Eosinophils Absolute: 0.4 10*3/uL (ref 0.0–0.5)
Eosinophils Relative: 9 %
HCT: 37 % — ABNORMAL LOW (ref 39.0–52.0)
Hemoglobin: 11.3 g/dL — ABNORMAL LOW (ref 13.0–17.0)
Immature Granulocytes: 0 %
Lymphocytes Relative: 30 %
Lymphs Abs: 1.3 10*3/uL (ref 0.7–4.0)
MCH: 31.3 pg (ref 26.0–34.0)
MCHC: 30.5 g/dL (ref 30.0–36.0)
MCV: 102.5 fL — ABNORMAL HIGH (ref 80.0–100.0)
Monocytes Absolute: 0.4 10*3/uL (ref 0.1–1.0)
Monocytes Relative: 9 %
Neutro Abs: 2.2 10*3/uL (ref 1.7–7.7)
Neutrophils Relative %: 52 %
Platelets: 153 10*3/uL (ref 150–400)
RBC: 3.61 MIL/uL — ABNORMAL LOW (ref 4.22–5.81)
RDW: 13.8 % (ref 11.5–15.5)
WBC: 4.3 10*3/uL (ref 4.0–10.5)
nRBC: 0 % (ref 0.0–0.2)

## 2019-06-01 LAB — COMPREHENSIVE METABOLIC PANEL
ALT: 12 U/L (ref 0–44)
AST: 20 U/L (ref 15–41)
Albumin: 4 g/dL (ref 3.5–5.0)
Alkaline Phosphatase: 74 U/L (ref 38–126)
Anion gap: 8 (ref 5–15)
BUN: 17 mg/dL (ref 8–23)
CO2: 23 mmol/L (ref 22–32)
Calcium: 9 mg/dL (ref 8.9–10.3)
Chloride: 108 mmol/L (ref 98–111)
Creatinine, Ser: 1.36 mg/dL — ABNORMAL HIGH (ref 0.61–1.24)
GFR calc Af Amer: >60 mL/min (ref >60)
GFR calc non Af Amer: 53 mL/min — ABNORMAL LOW (ref >60)
Glucose, Bld: 122 mg/dL — ABNORMAL HIGH (ref 70–99)
Potassium: 3.9 mmol/L (ref 3.5–5.1)
Sodium: 139 mmol/L (ref 135–145)
Total Bilirubin: 0.9 mg/dL (ref 0.3–1.2)
Total Protein: 6.9 g/dL (ref 6.5–8.1)

## 2019-06-01 LAB — PROTIME-INR
INR: 1.3 — ABNORMAL HIGH (ref 0.8–1.2)
Prothrombin Time: 16.1 seconds — ABNORMAL HIGH (ref 11.4–15.2)

## 2019-06-01 LAB — TROPONIN I (HIGH SENSITIVITY)
Troponin I (High Sensitivity): 75 ng/L — ABNORMAL HIGH (ref <18)
Troponin I (High Sensitivity): 91 ng/L — ABNORMAL HIGH (ref <18)

## 2019-06-01 LAB — BRAIN NATRIURETIC PEPTIDE: B Natriuretic Peptide: 531.4 pg/mL — ABNORMAL HIGH (ref 0.0–100.0)

## 2019-06-01 LAB — DIGOXIN LEVEL: Digoxin Level: <0.2 ng/mL — ABNORMAL LOW (ref 0.8–2.0)

## 2019-06-01 LAB — CBG MONITORING, ED: Glucose-Capillary: 101 mg/dL — ABNORMAL HIGH (ref 70–99)

## 2019-06-01 LAB — MAGNESIUM: Magnesium: 1.9 mg/dL (ref 1.7–2.4)

## 2019-06-01 MED ORDER — METOPROLOL TARTRATE 5 MG/5ML IV SOLN
5.0000 mg | Freq: Once | INTRAVENOUS | Status: AC
  Administered 2019-06-01: 16:00:00 5 mg via INTRAVENOUS
  Filled 2019-06-01: qty 5

## 2019-06-01 MED ORDER — FUROSEMIDE 10 MG/ML IJ SOLN
60.0000 mg | Freq: Once | INTRAMUSCULAR | Status: AC
  Administered 2019-06-01: 60 mg via INTRAVENOUS
  Filled 2019-06-01: qty 6

## 2019-06-01 MED ORDER — ASPIRIN 81 MG PO CHEW
324.0000 mg | CHEWABLE_TABLET | Freq: Once | ORAL | Status: AC
  Administered 2019-06-01: 324 mg via ORAL
  Filled 2019-06-01: qty 4

## 2019-06-01 NOTE — ED Provider Notes (Addendum)
Signed out by Dr Vanita Panda at 1600 that cardiology was coming to see.  Cardiology, Dr Lovena Le has evaluated, reviewed labs, bnp, trop x 2, cxr, etc.  and indicates to give lasix 60 mg iv, lopressor 5 mg iv, and to d/c to home.  He indicates patient already has f/u appointment arranged for this Wednesday.   Recheck pt - pt is breathing comfortably, eating/drinking, watching tv, and appears in no acute distress. Currently hr is 92, rr 16, pulse ox 98%. No chest pain or discomfort. Indicates has adequate of his meds, including pradaxa.  Will d/c to home per cardiology plan.        Lajean Saver, MD 06/01/19 1810

## 2019-06-01 NOTE — Consult Note (Addendum)
Cardiology Consultation:   Patient ID: Glenn [name] MRN: [id number]; DOB: [birth year]  Admit date: 06/01/2019 Date of Consult: 06/01/2019  Primary Care Provider: [name], MD Primary Cardiologist: Virl Axe, MD     Patient Profile:   Glenn [name] is a 69 y.o. male with a hx of NICM, chronic CHF (systolic), ICD, Hep C, stroke (after missed doses of his Xarelto >> eliquis, recurrent stroke >> [name]), HTN  who is being seen today for the evaluation of fast AFib at the request of Dr. Vanita Panda.    Device information BSCi single chamber ICD, implanted 01/01/2014   AFib hx diagnosed +/- 2018   AAD Hx Tikosyn started discontinued after his stroke and discontinuation of a/c   History of Present Illness:   Glenn [name] in the last 6 months has had a number of hospitalizations for a number of reasons  Admitted 10/30/2018  COPD, CHF exacerbations, notes report medication non compliance including his  And PAFib RVR LVEF 15-20%, clean cath Discharged 11/06/2018   Admitted 11/10/2018 Stroke, large territory subacute hemorrhagic infarct in the right MCA territory Cardiology consulted, found in AFlutter 120's Neurology noted  Large R MCA infarct with hemorrhagic conversion, embolic secondary to known AF on Eliquis s/p reversal w/ Kcentra Rate controlled,  Neurology: [facility] regimen once hemorrhagic conversion resolves Discharged to CIR 11/13/2018 Discharged from [facility] 12/02/2018 and Readmitted to in-patient Acute care  Febrile, rapid AFib , suspect sepsis, abd pain, respiratory distress amio gtt used for rate control  (given severe LV dysfunction avoiding CCB and failed BB historically) Found with extensive DVT and IVC filter placed sepsis felt 2/2 aspiration pneumonia and acute cholecystitis >> drain placed Blood cultures  11/25/2018: nagative x2 (5 days) 12/02/2018: negative x2 (5 days) 12/05/2018: one of 2 + for coag neg staph species (the other neg x5  days) Cardiology followed, was rate controlled at discharge, observed to have frequent PVCs Discharged on 12/24/2018 to SNF On pradaxa (though apparently not taking at home from the SNF   Admitted 02/04/2019 SOB, rapid AFib, CHF Started on amio gtt EP consulted rec TEE/DCCV TEE noted LVEF 15-20%, LA thrombus and no DCCV completed Discharged 02/07/2019 pradaxa dig Metoprolol Entresto Aldactone   I saw him 05/12/19 noted with AFib RVR 130's, he was not taking his metoprolol and dig, for unclear reasons.   He was stable, feeling well, though aware if his Afib, his metoprolol and dig was resumed with plans for close follow up. He cancelled his 1 week follow up and rescheduled for last week, he no-showed to that visit. I had opportunity to discuss his case with Dr. [name] and rate control strategy will be his plan going forward.  He reports until last evening had been feeling well with no awareness of his Afin until yesterday.  He had indulged in BBQ yesterday with hot digs and hamburgers.  He reports taking all of his medicines as prescribed,  Including his BB and dig.  He woke feeling more SOB then usual, EMS was eventually called, he got diltiazem 10mg  OIV for suspect SVT, this slowed him noting his known Afib, rates in the ER improved some.  No reports of CP, near syncope or syncope.  He is feeling better., but still aware of a bit of shortness of breath   LABS K+ 3.9BUN/Creat 17/1.36 (baseline looks about 1.1), BNP 531 HS Trp 75 (known clean coronaries) WBC 4.3 H/H 11/37 Plts 153  CXR with some pulm edema  Past Medical History:  Diagnosis  Date   Acute blood loss anemia    Acute CVA (cerebrovascular accident) ([place])    Acute deep vein thrombosis (DVT) of both lower extremities ([name])    Acute kidney injury ([place])    Acute on chronic combined systolic and diastolic CHF (congestive heart failure) ([name])    Acute renal failure superimposed on stage 3a chronic kidney disease ([name])     Acute respiratory failure with hypoxia ([name])    Atrial fibrillation ([name])    Carpal tunnel syndrome of right wrist 02/28/2018   Cerebral edema ([place]) 11/13/2018   Cerebral infarction ([name])    CHF (congestive heart failure) ([place])    Cholecystitis 02/04/2019   Chronic right hip pain    DCM (dilated cardiomyopathy) ([name])    Dysphagia, post-stroke    Elevated troponin    Entrapment of right ulnar nerve 02/28/2018   Headache due to intracranial disease 11/14/2018   Hepatitis C    History of hemorrhagic stroke with residual hemiparesis ([place]) 02/04/2019   HTN (hypertension) 08/14/2016   Hyperlipidemia LDL goal <70 11/13/2018   Hypertension    ICD (implantable cardioverter-defibrillator) in place 09/13/2016   Impotence due to erectile dysfunction 09/30/2017   Ischemic cardiomyopathy    Labile blood glucose    Left leg DVT ([name]) 02/04/2019   Marijuana user 11/13/2018   Paroxysmal atrial fibrillation ([place])    Right middle cerebral artery stroke ([place]) 11/13/2018   Solitary pulmonary nodule 06/10/2017   5 mm RUL nodule noted incidentally as part of CVA workup 08/2016. With smoking history would obtain low-dose CT scan 08/2017.    Stroke (cerebrum) ([place]) Lg L MCA infarct w/ hemorrhagic conversion, embolic d/t AF 6/78/9381   Stroke ([place])    Trochanteric bursitis, right hip 11/14/2018   Visit for monitoring Tikosyn therapy 03/26/2017    Past Surgical History:  Procedure Laterality Date   CARDIAC DEFIBRILLATOR PLACEMENT  2015   CARDIOVERSION N/A 10/10/2016   Procedure: CARDIOVERSION;  Surgeon: Dorothy Spark, MD;  Location: [place];  Service: Cardiovascular;  Laterality: N/A;   CARDIOVERSION N/A 03/27/2017   Procedure: CARDIOVERSION;  Surgeon: Jerline Pain, MD;  Location: [facility];  Service: Cardiovascular;  Laterality: N/A;   CARDIOVERSION N/A 10/29/2018   Procedure: CARDIOVERSION;  Surgeon: Sanda Klein, MD;  Location: [place] [facility];  Service: Cardiovascular;  Laterality: N/A;    CARDIOVERSION N/A 11/05/2018   Procedure: CARDIOVERSION;  Surgeon: Acie Fredrickson Wonda Cheng, MD;  Location: [facility];  Service: Cardiovascular;  Laterality: N/A;   EYE SURGERY Left 1990   IR EXCHANGE BILIARY DRAIN  02/11/2019   IR IVC FILTER PLMT / S&I /IMG GUID/MOD SED  12/04/2018   IR PERC CHOLECYSTOSTOMY  12/13/2018   IR PERCUTANEOUS ART THROMBECTOMY/INFUSION INTRACRANIAL INC DIAG ANGIO  09/05/2016   IR RADIOLOGIST EVAL & MGMT  10/03/2016   RADIOLOGY WITH ANESTHESIA N/A 09/05/2016   Procedure: RADIOLOGY WITH ANESTHESIA;  Surgeon: Luanne Bras, MD;  Location: [facility];  Service: Radiology;  Laterality: N/A;   RIGHT/LEFT HEART CATH AND CORONARY ANGIOGRAPHY N/A 11/03/2018   Procedure: RIGHT/LEFT HEART CATH AND CORONARY ANGIOGRAPHY;  Surgeon: Lorretta Harp, MD;  Location: [facility];  Service: Cardiovascular;  Laterality: N/A;   TEE WITHOUT CARDIOVERSION N/A 02/06/2019   Procedure: TRANSESOPHAGEAL ECHOCARDIOGRAM (TEE);  Surgeon: Skeet Latch, MD;  Location: [facility];  Service: Cardiovascular;  Laterality: N/A;     Home Medications:  Prior to Admission medications   Medication Sig Start Date End Date Taking? Authorizing Provider  atorvastatin (LIPITOR) 20 MG tablet  TAKE 1 TABLET(20 MG) BY MOUTH DAILY AT 6 PM 03/13/19  Yes [name], MD  dabigatran ([name]) 150 MG CAPS capsule Take 1 capsule (150 mg total) by mouth 2 (two) times daily. 02/07/19  Yes Hongalgi, Lenis Dickinson, MD  digoxin (LANOXIN) 0.125 MG tablet Take 1 tablet (0.125 mg total) by mouth daily. 05/12/19  Yes Baldwin Jamaica, PA-C  furosemide (LASIX) 40 MG tablet Take 1 tablet (40 mg total) by mouth daily. 05/12/19  Yes Baldwin Jamaica, PA-C  gabapentin (NEURONTIN) 600 MG tablet Take 1 tablet (600 mg total) by mouth 3 (three) times daily. 03/25/19  Yes [name], MD  potassium chloride SA (KLOR-CON) 20 MEQ tablet TAKE 1 TABLET(20 MEQ) BY MOUTH DAILY 04/20/19  Yes [name], MD  sacubitril-valsartan  (ENTRESTO) 24-26 MG Take 1 tablet by mouth 2 (two) times daily. 03/12/19  Yes [name], MD  spironolactone (ALDACTONE) 25 MG tablet TAKE 1 TABLET(25 MG) BY MOUTH DAILY 03/13/19  Yes [name], MD  traMADol (ULTRAM) 50 MG tablet Take 1 tablet (50 mg total) by mouth every 8 (eight) hours as needed for severe pain. 03/12/19  Yes [name], MD  acetaminophen (TYLENOL) 325 MG tablet Take 2 tablets (650 mg total) by mouth every 4 (four) hours as needed for mild pain (or temp > 37.5 C (99.5 F)). Patient not taking: Reported on 06/01/2019 12/02/18   Angiulli, Lavon Paganini, PA-C  calcium carbonate (TUMS - DOSED IN MG ELEMENTAL CALCIUM) 500 MG chewable tablet Chew 1 tablet (200 mg of elemental calcium total) by mouth daily as needed for indigestion or heartburn. Patient not taking: Reported on 06/01/2019 03/12/19   [name], MD  ipratropium (ATROVENT HFA) 17 MCG/ACT inhaler Inhale 2 puffs into the lungs every 6 (six) hours as needed for wheezing. Patient not taking: Reported on 06/01/2019 03/16/19   [name], MD  levalbuterol [facility] HFA) 45 MCG/ACT inhaler Inhale 1-2 puffs into the lungs every 4 (four) hours as needed for wheezing. Patient not taking: Reported on 06/01/2019 03/13/19   [name], MD  metoprolol succinate (TOPROL-XL) 100 MG 24 hr tablet TAKE 1 TABLET(100 MG) BY MOUTH DAILY WITH OR IMMEDIATELY FOLLOWING A MEAL Patient not taking: Reported on 06/01/2019 03/13/19   [name], MD  pantoprazole (PROTONIX) 40 MG tablet TAKE 1 TABLET(40 MG) BY MOUTH DAILY Patient not taking: Reported on 06/01/2019 03/13/19   [name], MD    Inpatient Medications: Scheduled Meds:  furosemide  60 mg Intravenous Once   metoprolol tartrate  5 mg Intravenous Once   Continuous Infusions:   PRN Meds:   Allergies:    Allergies  Allergen Reactions   Benadryl [Diphenhydramine] Palpitations    Social History:   Social History   Socioeconomic History   Marital status: Single     Spouse name: Not on file   Number of children: Not on file   Years of education: 34 (some college)   Highest education level: Not on file  Occupational History   Occupation: disability  Tobacco Use   Smoking status: Former Smoker    Packs/day: 0.50    Types: Cigarettes   Smokeless tobacco: Never Used   Tobacco comment: a pack last three days  Substance and Sexual Activity   Alcohol use: Not Currently    Alcohol/week: 3.0 standard drinks    Types: 3 Cans of beer per week    Comment: pt stop drinking    Drug use: Not Currently    Frequency: 2.0 times per week  Types: Marijuana    Comment: stop smoking    Sexual activity: Yes    Partners: Female    Birth control/protection: Condom  Other Topics Concern   Not on file  Social History Narrative   Not on file   Social Determinants of Health   Financial Resource Strain:    Difficulty of Paying Living Expenses:   Food Insecurity:    Worried About Charity fundraiser in the Last Year:    Arboriculturist in the Last Year:   Transportation Needs:    Film/video editor (Medical):    Lack of Transportation (Non-Medical):   Physical Activity:    Days of Exercise per Week:    Minutes of Exercise per Session:   Stress:    Feeling of Stress :   Social Connections:    Frequency of Communication with Friends and Family:    Frequency of Social Gatherings with Friends and Family:    Attends Religious Services:    Active Member of Clubs or Organizations:    Attends Music therapist:    Marital Status:   Intimate Partner Violence:    Fear of Current or Ex-Partner:    Emotionally Abused:    Physically Abused:    Sexually Abused:     Family History:   Family History  Problem Relation Age of Onset   High blood pressure Mother    High blood pressure Father    Stroke Maternal Aunt    Heart disease Neg Hx      ROS:  Please see the history of present illness.  All other ROS reviewed and negative.      Physical Exam/Data:   Vitals:   06/01/19 1409 06/01/19 1410 06/01/19 1415 06/01/19 1515  BP:   (!) 126/97 135/89  Pulse:   (!) 53 95  Resp:   (!) 26 (!) 23  SpO2: 98%  97% 97%  Weight:  78 kg    Height:  5\' 7"  (1.702 m)     No intake or output data in the 24 hours ending 06/01/19 1614 Last 3 Weights 06/01/2019 05/12/2019 03/25/2019  Weight (lbs) 172 lb 175 lb 176 lb 9.6 oz  Weight (kg) 78.019 kg 79.379 kg 80.105 kg     Body mass index is 26.94 kg/m.  General:  Well nourished, well developed, in no acute distress HEENT: normal Lymph: no adenopathy Neck: no JVD Endocrine:  No thryomegaly Vascular: No carotid bruits  Cardiac: irreg-irreg, slightly tachycardic; 2/4SM, no gallops or rubs Lungs:  Normal WOB, clear lungs b/l, no wheezing, rhonchi or rales  Abd: soft, nontender, GB drain R abd remain, clean, np drainage Ext: 1-2+ edema, mid-shin b/l edema Musculoskeletal:  No deformities Skin: warm and dry  Neuro:  No gross focal abnormalities noted Psych:  Normal affect   EKG:  The EKG was personally reviewed and demonstrates:   AFib 96bpm, no ischemic changes  Telemetry:  Telemetry was personally reviewed and demonstrates:   AFib 90's-110's intermiuttently with talking 120's, also an occassional paced beat  Relevant CV Studies:  02/06/2019: TEE LVEF 15-20%.  Global hypokinesis RV systolic function mildly reduced  Mild MR and PR. Moderate AR Moderate to severe TR Small mobile thrombus in the left atrial appendage tip No vegetation or thrombus noted on PPM leads   Cardioversion was not performed due to LAA thrombus. For additional details see full report.       TTE 12/07/2018  1. Left ventricular ejection fraction, by visual  estimation, is 20%. The left ventricle has severely decreased function. Mildly increased left ventricular size. There is mildly increased left ventricular hypertrophy.  2. Left ventricular diastolic Doppler parameters are indeterminate in the  setting of rapid atrial fibrillation.  3. Global right ventricle has mildly reduced systolic function.The right ventricular size is normal. No increase in right ventricular wall thickness.  4. Left atrial size was mild-moderately dilated.  5. Right atrial size was normal.  6. Trivial pericardial effusion is present.  7. The pericardial effusion is posterior to the left ventricle.  8. Mild aortic valve annular calcification.  9. The mitral valve is grossly normal. Mild mitral valve regurgitation. 10. The tricuspid valve is grossly normal. Tricuspid valve regurgitation mild-moderate. 11. The aortic valve is tricuspid Aortic valve regurgitation is mild to moderate by color flow Doppler. 12. The pulmonic valve was grossly normal. Pulmonic valve regurgitation is mild by color flow Doppler. 13. Moderately elevated pulmonary artery systolic pressure. 14. The inferior vena cava is normal in size with greater than 50% respiratory variability, suggesting right atrial pressure of 3 mmHg. 15. A device wire is visualized. 16. The tricuspid regurgitant velocity is 3.68 m/s, and with an assumed right atrial pressure of 3 mmHg, the estimated right ventricular systolic pressure is moderately elevated at 57.2 mmHg. 17. No obvious vegetations involving valves or device wire.     Cardiac Cath 11/03/18 Mr. Corniel has clean coronary arteries, elevated filling pressures consistent with a nonischemic cardiomyopathy with acute on chronic systolic heart failure.  He is in A. fib with RVR.  He will need pharmacologic optimization and initiation of diuretic therapy for LVEDP and elevated filling pressures.  He already has an ICD in place.  The sheath was removed and a TR band was placed on the right wrist to achieve patent hemostasis.  The Swan-Ganz catheter was removed as well as the antecubital sheath.  The patient left lab in stable condition.  Dr. Fransico Him was notified of these results.   Echo 10/30/18   1. The  left ventricle has severely reduced systolic function, with an ejection fraction of 15-20%. The cavity size was moderately dilated. There is moderate concentric left ventricular hypertrophy. Left ventricular diastolic function could not be  evaluated secondary to atrial fibrillation. Left ventricular diffuse hypokinesis.  2. The right ventricle has moderately reduced systolic function. The cavity was moderately enlarged. There is no increase in right ventricular wall thickness. Right ventricular systolic pressure is moderately elevated with an estimated pressure of 45.5  mmHg.  3. Left atrial size was moderately dilated.  4. Right atrial size was severely dilated.  5. Tricuspid valve regurgitation is severe.  6. Mild thickening of the aortic valve. Aortic valve regurgitation is moderate by color flow Doppler.  7. The aorta is normal unless otherwise noted.    Laboratory Data:  High Sensitivity Troponin:   Recent Labs  Lab 06/01/19 1413  TROPONINIHS 75*     Chemistry Recent Labs  Lab 06/01/19 1413  NA 139  K 3.9  CL 108  CO2 23  GLUCOSE 122*  BUN 17  CREATININE 1.36*  CALCIUM 9.0  GFRNONAA 53*  GFRAA >60  ANIONGAP 8    Recent Labs  Lab 06/01/19 1413  PROT 6.9  ALBUMIN 4.0  AST 20  ALT 12  ALKPHOS 74  BILITOT 0.9   Hematology Recent Labs  Lab 06/01/19 1413  WBC 4.3  RBC 3.61*  HGB 11.3*  HCT 37.0*  MCV 102.5*  MCH 31.3  MCHC  30.5  RDW 13.8  PLT 153   BNP Recent Labs  Lab 06/01/19 1413  BNP 531.4*    DDimer No results for input(s): DDIMER in the last 168 hours.   Radiology/Studies:   DG Chest Portable 1 View Result Date: 06/01/2019 CLINICAL DATA:  Chest pain and shortness of breath. EXAM: PORTABLE CHEST 1 VIEW COMPARISON:  Chest x-ray dated February 03, 2019. FINDINGS: Unchanged left chest wall pacemaker. Stable cardiomegaly. Pulmonary vascular congestion with mild peripheral interstitial thickening at the right lung base. Improved aeration at the  lung bases. No focal consolidation, pleural effusion, or pneumothorax. No acute osseous abnormality. IMPRESSION: Mild interstitial pulmonary edema. Electronically Signed   By: Titus Dubin M.D.   On: 06/01/2019 15:16     Assessment and Plan:   1. Persistent AFib     CHA2DS2Vasc is 5, on [name]     Rate strategy going forward  2. NICM     Has BSCi single chamber ICD     He does not appear in any distress with his mild volume OL  Dr. Lovena Le has seen and examined the patient Recommend IV lasix 60mg  x1, IV lopressor 5mg  x1 Monitor, if improved after diuresis and remains stable would discharge from the ER He has follow up with EP service on Wed, discussed with him the importance of follow up.  He was aware of the appoint with intentions of going   For questions or updates, please contact [place] Please consult [email or web address] for contact info under   Signed, Baldwin Jamaica, PA-C  06/01/2019 4:14 PM  EP Attending  Patient seen and examined. Agree with the findings as noted above. The patient has a long h/o non-compliance and missed his clinic appointment last week. He ate hot dogs and barbeque yesterday. He denies non-compliance but has been in the past. He has not had syncope. His exam demonstrates an IRIR rhythm with minimal rales in the bases but no increased work of breathing. He has 2+ edema which is not new. Neuro is non-focal. Tele demonstrates atrial fib with a CVR/RVR.  A/P Atrial fib with a RVR/CVR - I would give 5-10 mg of IV lopressor. Continue home meds. Acute on chronic systolic heart failure - He is not decompensated. I would suggest 60 mg of IV lasix and allow him to be discharged home in a couple of hours.  Non-compliance - he is encouraged to avoid salty foods and not miss any of his meds.  Mikle Bosworth.D.

## 2019-06-01 NOTE — ED Notes (Signed)
Pt discharge instructions and follow-up care reviewed with the patient. The patient verbalized understanding of instructions. Pt discharged. 

## 2019-06-01 NOTE — ED Triage Notes (Signed)
Pt BIB [facility] from home. Pt complaint of shortness of breath. Upon EMS arrival, pt HR 160-180. Pt given 10 mg cardizem enroute. Pt HR down to 110. VSS. NAD.

## 2019-06-01 NOTE — ED Provider Notes (Signed)
[facility] EMERGENCY DEPARTMENT Provider Note   CSN: [id number] Arrival date & time: 06/01/19  1404     History Chief Complaint  Patient presents with  . Shortness of Breath  . Tachycardia    Glenn [name] is a 69 y.o. male.  HPI    Patient presents concern of dyspnea, now substantially improved.  He notes a history of multiple medical issues, states that he takes his medication as directed, including Plavix.  In particular patient has a history of atrial fibrillation. He notes that he was in his usual state of health until today, when not long after awakening he noticed shortness of breath.  There is no associated cough, no fever, no pain. With worsening of his dyspnea he called EMS. History obtained by those providers note that the patient was tachycardic, in the 160 range upon their arrival, dyspneic.  With consideration of SVT he received 10 mg of diltiazem.  By the time of arrival the patient's heart rate diminished into the 120 range. Now, on arrival the patient states that he has mild dyspnea, but again no pain, no fever, no discomfort.  He denies any recent swelling, weight gain, weight loss.   Past Medical History:  Diagnosis Date  . Acute blood loss anemia   . Acute CVA (cerebrovascular accident) ([place])   . Acute deep vein thrombosis (DVT) of both lower extremities ([name])   . Acute kidney injury ([place])   . Acute on chronic combined systolic and diastolic CHF (congestive heart failure) ([place])   . Acute renal failure superimposed on stage 3a chronic kidney disease ([place])   . Acute respiratory failure with hypoxia ([place])   . Atrial fibrillation ([place])   . Carpal tunnel syndrome of right wrist 02/28/2018  . Cerebral edema ([place]) 11/13/2018  . Cerebral infarction ([place])   . CHF (congestive heart failure) ([place])   . Cholecystitis 02/04/2019  . Chronic right hip pain   . DCM (dilated cardiomyopathy) ([place])   . Dysphagia, post-stroke   . Elevated troponin     . Entrapment of right ulnar nerve 02/28/2018  . Headache due to intracranial disease 11/14/2018  . Hepatitis C   . History of hemorrhagic stroke with residual hemiparesis ([place]) 02/04/2019  . HTN (hypertension) 08/14/2016  . Hyperlipidemia LDL goal <70 11/13/2018  . Hypertension   . ICD (implantable cardioverter-defibrillator) in place 09/13/2016  . Impotence due to erectile dysfunction 09/30/2017  . Ischemic cardiomyopathy   . Labile blood glucose   . Left leg DVT ([place]) 02/04/2019  . Marijuana user 11/13/2018  . Paroxysmal atrial fibrillation ([name])   . 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 Solitary pulmonary nodule 06/10/2017   5 mm RUL nodule noted incidentally as part of CVA workup 08/2016. With smoking history would obtain low-dose CT scan 08/2017.   . Stroke (cerebrum) ([name]) Lg L MCA infarct w/ hemorrhagic conversion, embolic d/t AF 1/44/3154  . Stroke ([place])   . Trochanteric bursitis, right hip 11/14/2018  . Visit for monitoring Tikosyn therapy 03/26/2017    Patient Active Problem List   Diagnosis Date Noted  . Left leg pain 03/27/2019  . History of hemorrhagic stroke with residual hemiparesis ([place]) 02/04/2019  . Left leg DVT ([place]) 02/04/2019  . Cholecystitis 02/04/2019  . Acute deep vein thrombosis (DVT) of both lower extremities ([name])   . Acute blood loss anemia   . Labile blood glucose   . AKI (acute kidney injury) ([place])   . Chronic right hip  pain   . Acute renal failure superimposed on stage 3a chronic kidney disease ([place])   . Headache due to intracranial disease 11/14/2018  . Trochanteric bursitis, right hip 11/14/2018  . Cerebral edema ([place]) 11/13/2018  . Hyperlipidemia LDL goal <70 11/13/2018  . Marijuana user 11/13/2018  . 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 Acute CVA (cerebrovascular accident) ([place])   . Noncompliance   . Dysphagia, post-stroke   . Acute on chronic combined systolic and diastolic CHF (congestive heart failure) ([place])    . DCM (dilated cardiomyopathy) ([place])   . Atrial fibrillation ([place]) 11/04/2018  . Elevated troponin   . Acute respiratory failure with hypoxia ([place])   . Acute kidney injury ([place])   . Entrapment of right ulnar nerve 02/28/2018  . Carpal tunnel syndrome of right wrist 02/28/2018  . Impotence due to erectile dysfunction 09/30/2017  . Solitary pulmonary nodule 06/10/2017  . Neck pain 04/06/2017  . Paroxysmal atrial fibrillation ([name])   . ICD (implantable cardioverter-defibrillator) in place 09/13/2016  . Chest pain 09/13/2016  . Tobacco abuse 09/13/2016  . Hospital discharge follow-up 09/13/2016  . Upper back pain 09/13/2016  . Housing problems 09/13/2016  . Ischemic cardiomyopathy   . Cerebral infarction ([place])   . Stroke (cerebrum) ([name]) Lg L MCA infarct w/ hemorrhagic conversion, embolic d/t AF 41/74/0814  . HTN (hypertension) 08/14/2016  . Chronic Hepatitis C  08/14/2016    Past Surgical History:  Procedure Laterality Date  . CARDIAC DEFIBRILLATOR PLACEMENT  2015  . CARDIOVERSION N/A 10/10/2016   Procedure: CARDIOVERSION;  Surgeon: Dorothy Spark, MD;  Location: [facility];  Service: Cardiovascular;  Laterality: N/A;  . CARDIOVERSION N/A 03/27/2017   Procedure: CARDIOVERSION;  Surgeon: Jerline Pain, MD;  Location[facility] ENDOSCOPY;  Service: Cardiovascular;  Laterality: N/A;  . CARDIOVERSION N/A 10/29/2018   Procedure: CARDIOVERSION;  Surgeon: Sanda Klein, MD;  Location: [facility] ENDOSCOPY;  Service: Cardiovascular;  Laterality: N/A;  . CARDIOVERSION N/A 11/05/2018   Procedure: CARDIOVERSION;  Surgeon: Acie Fredrickson Wonda Cheng, MD;  Location: [facility];  Service: Cardiovascular;  Laterality: N/A;  . EYE SURGERY Left 1990  . IR EXCHANGE BILIARY DRAIN  02/11/2019  . IR IVC FILTER PLMT / S&I /IMG GUID/MOD SED  12/04/2018  . IR PERC CHOLECYSTOSTOMY  12/13/2018  . IR PERCUTANEOUS ART THROMBECTOMY/INFUSION INTRACRANIAL INC DIAG ANGIO  09/05/2016  . IR RADIOLOGIST EVAL & MGMT  10/03/2016  .  RADIOLOGY WITH ANESTHESIA N/A 09/05/2016   Procedure: RADIOLOGY WITH ANESTHESIA;  Surgeon: Luanne Bras, MD;  Location: [facility];  Service: Radiology;  Laterality: N/A;  . RIGHT/LEFT HEART CATH AND CORONARY ANGIOGRAPHY N/A 11/03/2018   Procedure: RIGHT/LEFT HEART CATH AND CORONARY ANGIOGRAPHY;  Surgeon: Lorretta Harp, MD;  Location: [facility] CV LAB;  Service: Cardiovascular;  Laterality: N/A;  . TEE WITHOUT CARDIOVERSION N/A 02/06/2019   Procedure: TRANSESOPHAGEAL ECHOCARDIOGRAM (TEE);  Surgeon: Skeet Latch, MD;  Location: [facility] ENDOSCOPY;  Service: Cardiovascular;  Laterality: N/A;       Family History  Problem Relation Age of Onset  . High blood pressure Mother   . High blood pressure Father   . Stroke Maternal Aunt   . Heart disease Neg Hx     Social History   Tobacco Use  . Smoking status: Former Smoker    Packs/day: 0.50    Types: Cigarettes  . Smokeless tobacco: Never Used  . Tobacco comment: a pack last three days  Substance Use Topics  . Alcohol use: Not Currently  Alcohol/week: 3.0 standard drinks    Types: 3 Cans of beer per week    Comment: pt stop drinking   . Drug use: Not Currently    Frequency: 2.0 times per week    Types: Marijuana    Comment: stop smoking     Home Medications Prior to Admission medications   Medication Sig Start Date End Date Taking? Authorizing Provider  acetaminophen (TYLENOL) 325 MG tablet Take 2 tablets (650 mg total) by mouth every 4 (four) hours as needed for mild pain (or temp > 37.5 C (99.5 F)). 12/02/18   Angiulli, Lavon Paganini, PA-C  atorvastatin (LIPITOR) 20 MG tablet TAKE 1 TABLET(20 MG) BY MOUTH DAILY AT 6 PM 03/13/19   [name], MD  calcium carbonate (TUMS - DOSED IN MG ELEMENTAL CALCIUM) 500 MG chewable tablet Chew 1 tablet (200 mg of elemental calcium total) by mouth daily as needed for indigestion or heartburn. 03/12/19   [name], MD  dabigatran ([name]) 150 MG CAPS capsule Take 1 capsule (150 mg  total) by mouth 2 (two) times daily. 02/07/19   Hongalgi, Lenis Dickinson, MD  digoxin (LANOXIN) 0.125 MG tablet Take 1 tablet (0.125 mg total) by mouth daily. 05/12/19   Baldwin Jamaica, PA-C  furosemide (LASIX) 40 MG tablet Take 1 tablet (40 mg total) by mouth daily. 05/12/19   Baldwin Jamaica, PA-C  gabapentin (NEURONTIN) 600 MG tablet Take 1 tablet (600 mg total) by mouth 3 (three) times daily. 03/25/19   [name], MD  ipratropium (ATROVENT HFA) 17 MCG/ACT inhaler Inhale 2 puffs into the lungs every 6 (six) hours as needed for wheezing. 03/16/19   [name], MD  levalbuterol [facility] HFA) 45 MCG/ACT inhaler Inhale 1-2 puffs into the lungs every 4 (four) hours as needed for wheezing. 03/13/19   [name], MD  metoprolol succinate (TOPROL-XL) 100 MG 24 hr tablet TAKE 1 TABLET(100 MG) BY MOUTH DAILY WITH OR IMMEDIATELY FOLLOWING A MEAL 03/13/19   [name], MD  pantoprazole (PROTONIX) 40 MG tablet TAKE 1 TABLET(40 MG) BY MOUTH DAILY 03/13/19   [name], MD  potassium chloride SA (KLOR-CON) 20 MEQ tablet TAKE 1 TABLET(20 MEQ) BY MOUTH DAILY 04/20/19   [name], MD  sacubitril-valsartan (ENTRESTO) 24-26 MG Take 1 tablet by mouth 2 (two) times daily. 03/12/19   [name], MD  spironolactone (ALDACTONE) 25 MG tablet TAKE 1 TABLET(25 MG) BY MOUTH DAILY 03/13/19   [name], MD  traMADol (ULTRAM) 50 MG tablet Take 1 tablet (50 mg total) by mouth every 8 (eight) hours as needed for severe pain. 03/12/19   [name], MD    Allergies    Benadryl [diphenhydramine]  Review of Systems   Review of Systems  Constitutional:       Per HPI, otherwise negative  HENT:       Per HPI, otherwise negative  Respiratory:       Per HPI, otherwise negative  Cardiovascular:       Per HPI, otherwise negative  Gastrointestinal: Negative for vomiting.  Endocrine:       Negative aside from HPI  Genitourinary:       Neg aside from HPI   Musculoskeletal:       Per HPI,  otherwise negative  Skin: Negative.   Neurological: Negative for syncope.    Physical Exam Updated Vital Signs BP (!) 126/97   Pulse (!) 53   Resp (!) 26   Ht 5\' 7"  (1.702 m)   Wt 78  kg   SpO2 97%   BMI 26.94 kg/m   Physical Exam Vitals and nursing note reviewed.  Constitutional:      General: He is not in acute distress.    Appearance: He is well-developed.  HENT:     Head: Normocephalic and atraumatic.  Eyes:     Conjunctiva/sclera: Conjunctivae normal.  Cardiovascular:     Rate and Rhythm: Tachycardia present. Rhythm irregular.  Pulmonary:     Effort: Pulmonary effort is normal. Tachypnea present. No respiratory distress.     Breath sounds: No stridor.  Abdominal:     General: There is no distension.  Skin:    General: Skin is warm and dry.  Neurological:     Mental Status: He is alert and oriented to person, place, and time.     ED Results / Procedures / Treatments   Labs (all labs ordered are listed, but only abnormal results are displayed) Labs Reviewed  COMPREHENSIVE METABOLIC PANEL - Abnormal; Notable for the following components:      Result Value   Glucose, Bld 122 (*)    Creatinine, Ser 1.36 (*)    GFR calc non Af Amer 53 (*)    All other components within normal limits  BRAIN NATRIURETIC PEPTIDE - Abnormal; Notable for the following components:   B Natriuretic Peptide 531.4 (*)    All other components within normal limits  CBC WITH DIFFERENTIAL/PLATELET - Abnormal; Notable for the following components:   RBC 3.61 (*)    Hemoglobin 11.3 (*)    HCT 37.0 (*)    MCV 102.5 (*)    All other components within normal limits  PROTIME-INR - Abnormal; Notable for the following components:   Prothrombin Time 16.1 (*)    INR 1.3 (*)    All other components within normal limits  CBG MONITORING, ED - Abnormal; Notable for the following components:   Glucose-Capillary 101 (*)    All other components within normal limits  TROPONIN I (HIGH SENSITIVITY) -  Abnormal; Notable for the following components:   Troponin I (High Sensitivity) 75 (*)    All other components within normal limits  MAGNESIUM  TROPONIN I (HIGH SENSITIVITY)    EKG EKG Interpretation  Date/Time:  Monday June 01 2019 14:10:44 EDT Ventricular Rate:  96 PR Interval:    QRS Duration: 88 QT Interval:  339 QTC Calculation: 429 R Axis:   15 Text Interpretation: Atrial fibrillation Ventricular bigeminy Anteroseptal infarct, old Nonspecific T abnormalities, lateral leads Abnormal ECG Confirmed by Carmin Muskrat [phone]) on 06/01/2019 2:13:15 PM   Radiology DG Chest Portable 1 View  Result Date: 06/01/2019 CLINICAL DATA:  Chest pain and shortness of breath. EXAM: PORTABLE CHEST 1 VIEW COMPARISON:  Chest x-ray dated February 03, 2019. FINDINGS: Unchanged left chest wall pacemaker. Stable cardiomegaly. Pulmonary vascular congestion with mild peripheral interstitial thickening at the right lung base. Improved aeration at the lung bases. No focal consolidation, pleural effusion, or pneumothorax. No acute osseous abnormality. IMPRESSION: Mild interstitial pulmonary edema. Electronically Signed   By: Titus Dubin M.D.   On: 06/01/2019 15:16    Procedures Procedures (including critical care time)  Medications Ordered in ED Medications  aspirin chewable tablet 324 mg (324 mg Oral Given 06/01/19 1418)    ED Course  I have reviewed the triage vital signs and the nursing notes.  Pertinent labs & imaging results that were available during my care of the patient were reviewed by me and considered in my medical decision making (see  chart for details).   After arrival with consideration of A. fib versus heart failure versus ACS versus pneumonia versus electrolyte abnormalities labs, x-ray ordered, patient started on continuous cardiac monitoring, pulse oximetry continuous provided as well.  Initial cardiac rhythm on monitor 90s, A. fib, no gross ischemia but abnormal  3:40  PM Patient continues to have mild tachycardia, with a rate variable 95, 110. Initial labs resulted no concerning for slight fluid overload status, and worsening renal function.  Patient, patient is found to have slightly elevated troponin value.  Given this constellation of findings, concern for fluid balance in this patient with a history of diminished EF, approximately 20% on chart review, and persistent tachycardia, consulted with cardiology colleagues for assistance with management, monitoring. Final Clinical Impression(s) / ED Diagnoses Final diagnoses:  Chronic atrial fibrillation ([place])  Shortness of breath     Carmin Muskrat, MD 06/01/19 1541

## 2019-06-01 NOTE — H&P (View-Only) (Signed)
Cardiology Consultation:   Patient ID: MOHANAD CARSTEN MRN: [id number]; DOB: 28-Jul-1950  Admit date: 06/01/2019 Date of Consult: 06/01/2019  Primary Care Provider: [name], MD Primary Cardiologist: Virl Axe, MD     Patient Profile:   Dicky Boer Gilmer is a 69 y.o. male with a hx of NICM, chronic CHF (systolic), ICD, Hep C, stroke (after missed doses of his Xarelto >> eliquis, recurrent stroke >> [name]), HTN  who is being seen today for the evaluation of fast AFib at the request of Dr. Vanita Panda.    Device information BSCi single chamber ICD, implanted 01/01/2014   AFib hx diagnosed +/- 2018   AAD Hx Tikosyn started discontinued after his stroke and discontinuation of a/c   History of Present Illness:   Mr. Boule in the last 6 months has had a number of hospitalizations for a number of reasons  Admitted 10/30/2018  COPD, CHF exacerbations, notes report medication non compliance including his  And PAFib RVR LVEF 15-20%, clean cath Discharged 11/06/2018   Admitted 11/10/2018 Stroke, large territory subacute hemorrhagic infarct in the right MCA territory Cardiology consulted, found in AFlutter 120's Neurology noted  Large R MCA infarct with hemorrhagic conversion, embolic secondary to known AF on Eliquis s/p reversal w/ Kcentra Rate controlled,  Neurology: Future [facility] regimen once hemorrhagic conversion resolves Discharged to CIR 11/13/2018 Discharged from [facility] 12/02/2018 and Readmitted to in-patient Acute care  Febrile, rapid AFib , suspect sepsis, abd pain, respiratory distress amio gtt used for rate control  (given severe LV dysfunction avoiding CCB and failed BB historically) Found with extensive DVT and IVC filter placed sepsis felt 2/2 aspiration pneumonia and acute cholecystitis >> drain placed Blood cultures  11/25/2018: nagative x2 (5 days) 12/02/2018: negative x2 (5 days) 12/05/2018: one of 2 + for coag neg staph species (the other neg x5  days) Cardiology followed, was rate controlled at discharge, observed to have frequent PVCs Discharged on 12/24/2018 to SNF On pradaxa (though apparently not taking at home from the SNF   Admitted 02/04/2019 SOB, rapid AFib, CHF Started on amio gtt EP consulted rec TEE/DCCV TEE noted LVEF 15-20%, LA thrombus and no DCCV completed Discharged 02/07/2019 pradaxa dig Metoprolol Entresto Aldactone   I saw him 05/12/19 noted with AFib RVR 130's, he was not taking his metoprolol and dig, for unclear reasons.   He was stable, feeling well, though aware if his Afib, his metoprolol and dig was resumed with plans for close follow up. He cancelled his 1 week follow up and rescheduled for last week, he no-showed to that visit. I had opportunity to discuss his case with Dr. [name] and rate control strategy will be his plan going forward.  He reports until last evening had been feeling well with no awareness of his Afin until yesterday.  He had indulged in BBQ yesterday with hot digs and hamburgers.  He reports taking all of his medicines as prescribed,  Including his BB and dig.  He woke feeling more SOB then usual, EMS was eventually called, he got diltiazem 10mg  OIV for suspect SVT, this slowed him noting his known Afib, rates in the ER improved some.  No reports of CP, near syncope or syncope.  He is feeling better., but still aware of a bit of shortness of breath   LABS K+ 3.9BUN/Creat 17/1.36 (baseline looks about 1.1), BNP 531 HS Trp 75 (known clean coronaries) WBC 4.3 H/H 11/37 Plts 153  CXR with some pulm edema  Past Medical History:  Diagnosis  Date   Acute blood loss anemia    Acute CVA (cerebrovascular accident) ([place])    Acute deep vein thrombosis (DVT) of both lower extremities ([name])    Acute kidney injury ([place])    Acute on chronic combined systolic and diastolic CHF (congestive heart failure) ([name])    Acute renal failure superimposed on stage 3a chronic kidney disease ([name])     Acute respiratory failure with hypoxia ([name])    Atrial fibrillation ([name])    Carpal tunnel syndrome of right wrist 02/28/2018   Cerebral edema ([place]) 11/13/2018   Cerebral infarction ([name])    CHF (congestive heart failure) ([place])    Cholecystitis 02/04/2019   Chronic right hip pain    DCM (dilated cardiomyopathy) ([name])    Dysphagia, post-stroke    Elevated troponin    Entrapment of right ulnar nerve 02/28/2018   Headache due to intracranial disease 11/14/2018   Hepatitis C    History of hemorrhagic stroke with residual hemiparesis ([place]) 02/04/2019   HTN (hypertension) 08/14/2016   Hyperlipidemia LDL goal <70 11/13/2018   Hypertension    ICD (implantable cardioverter-defibrillator) in place 09/13/2016   Impotence due to erectile dysfunction 09/30/2017   Ischemic cardiomyopathy    Labile blood glucose    Left leg DVT ([place]) 02/04/2019   Marijuana user 11/13/2018   Paroxysmal atrial fibrillation ([place])    Right middle cerebral artery stroke ([place]) 11/13/2018   Solitary pulmonary nodule 06/10/2017   5 mm RUL nodule noted incidentally as part of CVA workup 08/2016. With smoking history would obtain low-dose CT scan 08/2017.    Stroke (cerebrum) ([place]) Lg L MCA infarct w/ hemorrhagic conversion, embolic d/t AF 05/17/9240   Stroke ([place])    Trochanteric bursitis, right hip 11/14/2018   Visit for monitoring Tikosyn therapy 03/26/2017    Past Surgical History:  Procedure Laterality Date   CARDIAC DEFIBRILLATOR PLACEMENT  2015   CARDIOVERSION N/A 10/10/2016   Procedure: CARDIOVERSION;  Surgeon: Dorothy Spark, MD;  Location: [facility];  Service: Cardiovascular;  Laterality: N/A;   CARDIOVERSION N/A 03/27/2017   Procedure: CARDIOVERSION;  Surgeon: Jerline Pain, MD;  Location: [facility];  Service: Cardiovascular;  Laterality: N/A;   CARDIOVERSION N/A 10/29/2018   Procedure: CARDIOVERSION;  Surgeon: Sanda Klein, MD;  Location: [facility];  Service: Cardiovascular;  Laterality: N/A;    CARDIOVERSION N/A 11/05/2018   Procedure: CARDIOVERSION;  Surgeon: Acie Fredrickson Wonda Cheng, MD;  Location: [facility];  Service: Cardiovascular;  Laterality: N/A;   EYE SURGERY Left 1990   IR EXCHANGE BILIARY DRAIN  02/11/2019   IR IVC FILTER PLMT / S&I /IMG GUID/MOD SED  12/04/2018   IR PERC CHOLECYSTOSTOMY  12/13/2018   IR PERCUTANEOUS ART THROMBECTOMY/INFUSION INTRACRANIAL INC DIAG ANGIO  09/05/2016   IR RADIOLOGIST EVAL & MGMT  10/03/2016   RADIOLOGY WITH ANESTHESIA N/A 09/05/2016   Procedure: RADIOLOGY WITH ANESTHESIA;  Surgeon: Luanne Bras, MD;  Location: [facility];  Service: Radiology;  Laterality: N/A;   RIGHT/LEFT HEART CATH AND CORONARY ANGIOGRAPHY N/A 11/03/2018   Procedure: RIGHT/LEFT HEART CATH AND CORONARY ANGIOGRAPHY;  Surgeon: Lorretta Harp, MD;  Location: [facility];  Service: Cardiovascular;  Laterality: N/A;   TEE WITHOUT CARDIOVERSION N/A 02/06/2019   Procedure: TRANSESOPHAGEAL ECHOCARDIOGRAM (TEE);  Surgeon: Skeet Latch, MD;  Location: [facility];  Service: Cardiovascular;  Laterality: N/A;     Home Medications:  Prior to Admission medications   Medication Sig Start Date End Date Taking? Authorizing Provider  atorvastatin (LIPITOR) 20 MG tablet  TAKE 1 TABLET(20 MG) BY MOUTH DAILY AT 6 PM 03/13/19  Yes [name], MD  dabigatran ([name]) 150 MG CAPS capsule Take 1 capsule (150 mg total) by mouth 2 (two) times daily. 02/07/19  Yes Hongalgi, Lenis Dickinson, MD  digoxin (LANOXIN) 0.125 MG tablet Take 1 tablet (0.125 mg total) by mouth daily. 05/12/19  Yes Baldwin Jamaica, PA-C  furosemide (LASIX) 40 MG tablet Take 1 tablet (40 mg total) by mouth daily. 05/12/19  Yes Baldwin Jamaica, PA-C  gabapentin (NEURONTIN) 600 MG tablet Take 1 tablet (600 mg total) by mouth 3 (three) times daily. 03/25/19  Yes [name], MD  potassium chloride SA (KLOR-CON) 20 MEQ tablet TAKE 1 TABLET(20 MEQ) BY MOUTH DAILY 04/20/19  Yes [name], MD  sacubitril-valsartan  (ENTRESTO) 24-26 MG Take 1 tablet by mouth 2 (two) times daily. 03/12/19  Yes [name], MD  spironolactone (ALDACTONE) 25 MG tablet TAKE 1 TABLET(25 MG) BY MOUTH DAILY 03/13/19  Yes [name], MD  traMADol (ULTRAM) 50 MG tablet Take 1 tablet (50 mg total) by mouth every 8 (eight) hours as needed for severe pain. 03/12/19  Yes [name], MD  acetaminophen (TYLENOL) 325 MG tablet Take 2 tablets (650 mg total) by mouth every 4 (four) hours as needed for mild pain (or temp > 37.5 C (99.5 F)). Patient not taking: Reported on 06/01/2019 12/02/18   Angiulli, Lavon Paganini, PA-C  calcium carbonate (TUMS - DOSED IN MG ELEMENTAL CALCIUM) 500 MG chewable tablet Chew 1 tablet (200 mg of elemental calcium total) by mouth daily as needed for indigestion or heartburn. Patient not taking: Reported on 06/01/2019 03/12/19   [name], MD  ipratropium (ATROVENT HFA) 17 MCG/ACT inhaler Inhale 2 puffs into the lungs every 6 (six) hours as needed for wheezing. Patient not taking: Reported on 06/01/2019 03/16/19   [name], MD  levalbuterol [facility] HFA) 45 MCG/ACT inhaler Inhale 1-2 puffs into the lungs every 4 (four) hours as needed for wheezing. Patient not taking: Reported on 06/01/2019 03/13/19   [name], MD  metoprolol succinate (TOPROL-XL) 100 MG 24 hr tablet TAKE 1 TABLET(100 MG) BY MOUTH DAILY WITH OR IMMEDIATELY FOLLOWING A MEAL Patient not taking: Reported on 06/01/2019 03/13/19   [name], MD  pantoprazole (PROTONIX) 40 MG tablet TAKE 1 TABLET(40 MG) BY MOUTH DAILY Patient not taking: Reported on 06/01/2019 03/13/19   [name], MD    Inpatient Medications: Scheduled Meds:  furosemide  60 mg Intravenous Once   metoprolol tartrate  5 mg Intravenous Once   Continuous Infusions:   PRN Meds:   Allergies:    Allergies  Allergen Reactions   Benadryl [Diphenhydramine] Palpitations    Social History:   Social History   Socioeconomic History   Marital status: Single     Spouse name: Not on file   Number of children: Not on file   Years of education: 56 (some college)   Highest education level: Not on file  Occupational History   Occupation: disability  Tobacco Use   Smoking status: Former Smoker    Packs/day: 0.50    Types: Cigarettes   Smokeless tobacco: Never Used   Tobacco comment: a pack last three days  Substance and Sexual Activity   Alcohol use: Not Currently    Alcohol/week: 3.0 standard drinks    Types: 3 Cans of beer per week    Comment: pt stop drinking    Drug use: Not Currently    Frequency: 2.0 times per week  Types: Marijuana    Comment: stop smoking    Sexual activity: Yes    Partners: Female    Birth control/protection: Condom  Other Topics Concern   Not on file  Social History Narrative   Not on file   Social Determinants of Health   Financial Resource Strain:    Difficulty of Paying Living Expenses:   Food Insecurity:    Worried About Charity fundraiser in the Last Year:    Arboriculturist in the Last Year:   Transportation Needs:    Film/video editor (Medical):    Lack of Transportation (Non-Medical):   Physical Activity:    Days of Exercise per Week:    Minutes of Exercise per Session:   Stress:    Feeling of Stress :   Social Connections:    Frequency of Communication with Friends and Family:    Frequency of Social Gatherings with Friends and Family:    Attends Religious Services:    Active Member of Clubs or Organizations:    Attends Music therapist:    Marital Status:   Intimate Partner Violence:    Fear of Current or Ex-Partner:    Emotionally Abused:    Physically Abused:    Sexually Abused:     Family History:   Family History  Problem Relation Age of Onset   High blood pressure Mother    High blood pressure Father    Stroke Maternal Aunt    Heart disease Neg Hx      ROS:  Please see the history of present illness.  All other ROS reviewed and negative.      Physical Exam/Data:   Vitals:   06/01/19 1409 06/01/19 1410 06/01/19 1415 06/01/19 1515  BP:   (!) 126/97 135/89  Pulse:   (!) 53 95  Resp:   (!) 26 (!) 23  SpO2: 98%  97% 97%  Weight:  78 kg    Height:  5\' 7"  (1.702 m)     No intake or output data in the 24 hours ending 06/01/19 1614 Last 3 Weights 06/01/2019 05/12/2019 03/25/2019  Weight (lbs) 172 lb 175 lb 176 lb 9.6 oz  Weight (kg) 78.019 kg 79.379 kg 80.105 kg     Body mass index is 26.94 kg/m.  General:  Well nourished, well developed, in no acute distress HEENT: normal Lymph: no adenopathy Neck: no JVD Endocrine:  No thryomegaly Vascular: No carotid bruits  Cardiac: irreg-irreg, slightly tachycardic; 2/4SM, no gallops or rubs Lungs:  Normal WOB, clear lungs b/l, no wheezing, rhonchi or rales  Abd: soft, nontender, GB drain R abd remain, clean, np drainage Ext: 1-2+ edema, mid-shin b/l edema Musculoskeletal:  No deformities Skin: warm and dry  Neuro:  No gross focal abnormalities noted Psych:  Normal affect   EKG:  The EKG was personally reviewed and demonstrates:   AFib 96bpm, no ischemic changes  Telemetry:  Telemetry was personally reviewed and demonstrates:   AFib 90's-110's intermiuttently with talking 120's, also an occassional paced beat  Relevant CV Studies:  02/06/2019: TEE LVEF 15-20%.  Global hypokinesis RV systolic function mildly reduced  Mild MR and PR. Moderate AR Moderate to severe TR Small mobile thrombus in the left atrial appendage tip No vegetation or thrombus noted on PPM leads   Cardioversion was not performed due to LAA thrombus. For additional details see full report.       TTE 12/07/2018  1. Left ventricular ejection fraction, by visual  estimation, is 20%. The left ventricle has severely decreased function. Mildly increased left ventricular size. There is mildly increased left ventricular hypertrophy.  2. Left ventricular diastolic Doppler parameters are indeterminate in the  setting of rapid atrial fibrillation.  3. Global right ventricle has mildly reduced systolic function.The right ventricular size is normal. No increase in right ventricular wall thickness.  4. Left atrial size was mild-moderately dilated.  5. Right atrial size was normal.  6. Trivial pericardial effusion is present.  7. The pericardial effusion is posterior to the left ventricle.  8. Mild aortic valve annular calcification.  9. The mitral valve is grossly normal. Mild mitral valve regurgitation. 10. The tricuspid valve is grossly normal. Tricuspid valve regurgitation mild-moderate. 11. The aortic valve is tricuspid Aortic valve regurgitation is mild to moderate by color flow Doppler. 12. The pulmonic valve was grossly normal. Pulmonic valve regurgitation is mild by color flow Doppler. 13. Moderately elevated pulmonary artery systolic pressure. 14. The inferior vena cava is normal in size with greater than 50% respiratory variability, suggesting right atrial pressure of 3 mmHg. 15. A device wire is visualized. 16. The tricuspid regurgitant velocity is 3.68 m/s, and with an assumed right atrial pressure of 3 mmHg, the estimated right ventricular systolic pressure is moderately elevated at 57.2 mmHg. 17. No obvious vegetations involving valves or device wire.     Cardiac Cath 11/03/18 Mr. Arauz has clean coronary arteries, elevated filling pressures consistent with a nonischemic cardiomyopathy with acute on chronic systolic heart failure.  He is in A. fib with RVR.  He will need pharmacologic optimization and initiation of diuretic therapy for LVEDP and elevated filling pressures.  He already has an ICD in place.  The sheath was removed and a TR band was placed on the right wrist to achieve patent hemostasis.  The Swan-Ganz catheter was removed as well as the antecubital sheath.  The patient left lab in stable condition.  Dr. Fransico Him was notified of these results.   Echo 10/30/18   1. The  left ventricle has severely reduced systolic function, with an ejection fraction of 15-20%. The cavity size was moderately dilated. There is moderate concentric left ventricular hypertrophy. Left ventricular diastolic function could not be  evaluated secondary to atrial fibrillation. Left ventricular diffuse hypokinesis.  2. The right ventricle has moderately reduced systolic function. The cavity was moderately enlarged. There is no increase in right ventricular wall thickness. Right ventricular systolic pressure is moderately elevated with an estimated pressure of 45.5  mmHg.  3. Left atrial size was moderately dilated.  4. Right atrial size was severely dilated.  5. Tricuspid valve regurgitation is severe.  6. Mild thickening of the aortic valve. Aortic valve regurgitation is moderate by color flow Doppler.  7. The aorta is normal unless otherwise noted.    Laboratory Data:  High Sensitivity Troponin:   Recent Labs  Lab 06/01/19 1413  TROPONINIHS 75*     Chemistry Recent Labs  Lab 06/01/19 1413  NA 139  K 3.9  CL 108  CO2 23  GLUCOSE 122*  BUN 17  CREATININE 1.36*  CALCIUM 9.0  GFRNONAA 53*  GFRAA >60  ANIONGAP 8    Recent Labs  Lab 06/01/19 1413  PROT 6.9  ALBUMIN 4.0  AST 20  ALT 12  ALKPHOS 74  BILITOT 0.9   Hematology Recent Labs  Lab 06/01/19 1413  WBC 4.3  RBC 3.61*  HGB 11.3*  HCT 37.0*  MCV 102.5*  MCH 31.3  MCHC  30.5  RDW 13.8  PLT 153   BNP Recent Labs  Lab 06/01/19 1413  BNP 531.4*    DDimer No results for input(s): DDIMER in the last 168 hours.   Radiology/Studies:   DG Chest Portable 1 View Result Date: 06/01/2019 CLINICAL DATA:  Chest pain and shortness of breath. EXAM: PORTABLE CHEST 1 VIEW COMPARISON:  Chest x-ray dated February 03, 2019. FINDINGS: Unchanged left chest wall pacemaker. Stable cardiomegaly. Pulmonary vascular congestion with mild peripheral interstitial thickening at the right lung base. Improved aeration at the  lung bases. No focal consolidation, pleural effusion, or pneumothorax. No acute osseous abnormality. IMPRESSION: Mild interstitial pulmonary edema. Electronically Signed   By: Titus Dubin M.D.   On: 06/01/2019 15:16     Assessment and Plan:   1. Persistent AFib     CHA2DS2Vasc is 5, on [name]     Rate strategy going forward  2. NICM     Has BSCi single chamber ICD     He does not appear in any distress with his mild volume OL  Dr. Lovena Le has seen and examined the patient Recommend IV lasix 60mg  x1, IV lopressor 5mg  x1 Monitor, if improved after diuresis and remains stable would discharge from the ER He has follow up with EP service on Wed, discussed with him the importance of follow up.  He was aware of the appoint with intentions of going   For questions or updates, please contact [place] Please consult [email or web address] for contact info under   Signed, Baldwin Jamaica, PA-C  06/01/2019 4:14 PM  EP Attending  Patient seen and examined. Agree with the findings as noted above. The patient has a long h/o non-compliance and missed his clinic appointment last week. He ate hot dogs and barbeque yesterday. He denies non-compliance but has been in the past. He has not had syncope. His exam demonstrates an IRIR rhythm with minimal rales in the bases but no increased work of breathing. He has 2+ edema which is not new. Neuro is non-focal. Tele demonstrates atrial fib with a CVR/RVR.  A/P Atrial fib with a RVR/CVR - I would give 5-10 mg of IV lopressor. Continue home meds. Acute on chronic systolic heart failure - He is not decompensated. I would suggest 60 mg of IV lasix and allow him to be discharged home in a couple of hours.  Non-compliance - he is encouraged to avoid salty foods and not miss any of his meds.  Mikle Bosworth.D.

## 2019-06-01 NOTE — Discharge Instructions (Addendum)
It was our pleasure to provide your ER care today - we hope that you feel better.  Continue your home medications. Take one extra of your lasix tomorrow. Limit salt intake.   Follow up with your cardiologist in their office this Wednesday as planned. Also have them recheck your kidney function tests as it is mildly high today.   Return to ER right away if worse, new symptoms, high fevers, chest pain, increased trouble breathing, persistent fast heart beat, feeling weak/faint, or other concern.

## 2019-06-03 ENCOUNTER — Encounter: Payer: Self-pay | Admitting: Student

## 2019-06-03 ENCOUNTER — Other Ambulatory Visit: Payer: Self-pay

## 2019-06-03 ENCOUNTER — Ambulatory Visit (INDEPENDENT_AMBULATORY_CARE_PROVIDER_SITE_OTHER): Payer: Medicare (Managed Care) | Admitting: Student

## 2019-06-03 VITALS — BP 132/80 | HR 147 | Ht 67.0 in | Wt 174.6 lb

## 2019-06-03 DIAGNOSIS — I4811 Longstanding persistent atrial fibrillation: Secondary | ICD-10-CM | POA: Diagnosis not present

## 2019-06-03 DIAGNOSIS — Z01812 Encounter for preprocedural laboratory examination: Secondary | ICD-10-CM

## 2019-06-03 DIAGNOSIS — I10 Essential (primary) hypertension: Secondary | ICD-10-CM | POA: Diagnosis not present

## 2019-06-03 DIAGNOSIS — K819 Cholecystitis, unspecified: Secondary | ICD-10-CM

## 2019-06-03 LAB — CUP PACEART INCLINIC DEVICE CHECK
Date Time Interrogation Session: 20210414103404
HighPow Impedance: 36 Ohm
HighPow Impedance: 45 Ohm
Implantable Lead Implant Date: 20151113
Implantable Lead Location: 753860
Implantable Lead Model: 295
Implantable Lead Serial Number: 135220
Implantable Pulse Generator Implant Date: 20151113
Lead Channel Impedance Value: 619 Ohm
Lead Channel Sensing Intrinsic Amplitude: 20.4 mV
Lead Channel Setting Pacing Amplitude: 2.5 V
Lead Channel Setting Pacing Pulse Width: 0.5 ms
Lead Channel Setting Sensing Sensitivity: 0.5 mV
Pulse Gen Serial Number: 104417

## 2019-06-03 LAB — CUP PACEART REMOTE DEVICE CHECK
Battery Remaining Longevity: 18 mo
Battery Remaining Percentage: 34 %
Brady Statistic RV Percent Paced: 2 %
Date Time Interrogation Session: 20210414002300
HighPow Impedance: 35 Ohm
Implantable Lead Implant Date: 20151113
Implantable Lead Location: 753860
Implantable Lead Model: 295
Implantable Lead Serial Number: 135220
Implantable Pulse Generator Implant Date: 20151113
Lead Channel Impedance Value: 620 Ohm
Lead Channel Pacing Threshold Amplitude: 0.9 V
Lead Channel Pacing Threshold Pulse Width: 0.5 ms
Lead Channel Setting Pacing Amplitude: 2.5 V
Lead Channel Setting Pacing Pulse Width: 0.5 ms
Lead Channel Setting Sensing Sensitivity: 0.5 mV
Pulse Gen Serial Number: 104417

## 2019-06-03 LAB — BASIC METABOLIC PANEL
BUN/Creatinine Ratio: 11 (ref 10–24)
BUN: 17 mg/dL (ref 8–27)
CO2: 24 mmol/L (ref 20–29)
Calcium: 9.6 mg/dL (ref 8.6–10.2)
Chloride: 103 mmol/L (ref 96–106)
Creatinine, Ser: 1.51 mg/dL — ABNORMAL HIGH (ref 0.76–1.27)
GFR calc Af Amer: 54 mL/min/{1.73_m2} — ABNORMAL LOW (ref >59)
GFR calc non Af Amer: 47 mL/min/{1.73_m2} — ABNORMAL LOW (ref >59)
Glucose: 108 mg/dL — ABNORMAL HIGH (ref 65–99)
Potassium: 3.9 mmol/L (ref 3.5–5.2)
Sodium: 142 mmol/L (ref 134–144)

## 2019-06-03 LAB — CBC
Hematocrit: 33.7 % — ABNORMAL LOW (ref 37.5–51.0)
Hemoglobin: 11.2 g/dL — ABNORMAL LOW (ref 13.0–17.7)
MCH: 30.9 pg (ref 26.6–33.0)
MCHC: 33.2 g/dL (ref 31.5–35.7)
MCV: 93 fL (ref 79–97)
NRBC: 1 % — ABNORMAL HIGH (ref 0–0)
Platelets: 166 10*3/uL (ref 150–450)
RBC: 3.62 x10E6/uL — ABNORMAL LOW (ref 4.14–5.80)
RDW: 12.6 % (ref 11.6–15.4)
WBC: 4.4 10*3/uL (ref 3.4–10.8)

## 2019-06-03 NOTE — Progress Notes (Addendum)
Electrophysiology Office Note Date: 06/03/2019  ID:  Glenn [name], DOB 1950/09/11, MRN [id number]  PCP: Glenn Dawn, MD Primary Cardiologist: Glenn Axe, MD Electrophysiologist: Dr. Caryl [name]   CC: Routine ICD follow-up  Glenn [name] is a 69 y.o. male seen today for Dr. Caryl [name].  They present today for several week follow up due to AF RVR and med non-compliance.   Missed 1 week follow up.  He has had a recent string of complicated admissions for CHF, COPD, and CVA as below.  Admitted 10/30/2018  COPD, CHF exacerbations, notes report medication non compliance including his  And PAFib RVR LVEF 15-20%, clean cath Discharged 11/06/2018  Admitted 11/10/2018 Stroke, large territory subacute hemorrhagic infarctin theright MCA territory Cardiology consulted, found in AFlutter 120's Neurology noted Large R MCA infarct with hemorrhagic conversion, embolic secondary to known AF on Eliquis s/p reversal w/ Kcentra Rate controlled,  Neurology: Future Glenn [name] regimen once hemorrhagic conversion resolves Discharged to Glenn [name] 11/13/2018 Discharged from Glenn [name] 12/02/2018 and Readmitted to in-patient Acute care  Febrile, rapid AFib , suspect sepsis, abd [name], respiratory distress amio gtt used for rate control  (given severe LV dysfunction avoiding CCB and failed BB historically) Found with extensive DVT and IVC filter placed sepsis felt 2/2 aspiration pneumonia and acute cholecystitis >> drain placed Blood cultures  11/25/2018: nagative x2 (5 days) 12/02/2018: negative x2 (5 days) 12/05/2018: one of 2 + for coag neg staph species (the other neg x5 days) Cardiology followed, was rate controlled at discharge, observed to have frequent PVCs Discharged on 12/24/2018 to SNF On pradaxa (though apparently not taking at home from the SNF  Admitted 02/04/2019 SOB, rapid AFib, CHF Started on amio gtt EP consulted rec TEE/DCCV TEE noted LVEF 15-20%, LA thrombus and no DCCV completed Discharged  02/07/2019 pradaxa dig Metoprolol Entresto Aldactone  He had AFib clnic follow up 12/29, was in rate controlled AFib, planned to discuss with EP, consideration for repeat echo/study vs proceed with DCCV after 6-8 weeks of uninterrupted a/c.  05/08/2019 Device clinic note: Scheduled remote received that showed 2450 NSVT episodes , 6 VT-1 episodes with no therapy . Some episodes appear to be possible AF RVR. Known PAF, + [name] and Toprol XL 100 mg po Scheduled for f/u  Today Since last being seen in our clinic, the patient reports doing about the same. He continues to have issues with compliance. Thinks he missed his pradaxa once or twice in past several weeks. Seen in ED 4/12 SOB and tachycardia. Given IV lopressor and lasix with improvement and discharged home. As far as he knows, he is taking his digoxin and metoprolol. Labs taken in ED this week. He continues to struggle with a GB drain. Was told by IR "too early" to take it out, but also states he has not followed up with them in some time. He denies chest [name], nausea, vomiting, dizziness, syncope, weight gain, or early satiety.  He has not had ICD shocks.   Device History: Research officer, political party ICD implanted 12/2013 for NICM History of appropriate therapy: No History of AAD therapy: Yes   Past Medical History:  Diagnosis Date  . Acute blood loss anemia   . Acute CVA (cerebrovascular accident) (Glenn [name])   . Acute deep vein thrombosis (DVT) of both lower extremities (Glenn [name])   . Acute kidney injury ([place])   . Acute on chronic combined systolic and diastolic CHF (congestive heart failure) (Glenn [name])   . Acute renal failure superimposed on stage 3a chronic kidney  disease (Glenn [name])   . Acute respiratory failure with hypoxia (Glenn [name])   . Atrial fibrillation ([place])   . Carpal tunnel syndrome of right wrist 02/28/2018  . Cerebral edema (Glenn [name]) 11/13/2018  . Cerebral infarction (Glenn [name])   . CHF (congestive heart failure) ([place])   . Cholecystitis  02/04/2019  . Chronic right hip [name]   . DCM (dilated cardiomyopathy) (Glenn [name])   . Dysphagia, post-stroke   . Elevated troponin   . Entrapment of right ulnar nerve 02/28/2018  . Headache due to intracranial disease 11/14/2018  . Hepatitis C   . History of hemorrhagic stroke with residual hemiparesis (Glenn [name]) 02/04/2019  . HTN (hypertension) 08/14/2016  . Hyperlipidemia LDL goal <70 11/13/2018  . Hypertension   . ICD (implantable cardioverter-defibrillator) in place 09/13/2016  . Impotence due to erectile dysfunction 09/30/2017  . Ischemic cardiomyopathy   . Labile blood glucose   . Left leg DVT (Glenn [name]) 02/04/2019  . Marijuana user 11/13/2018  . Paroxysmal atrial fibrillation (Glenn [name])   . Right middle cerebral artery stroke (Glenn [name]) 11/13/2018  . Solitary pulmonary nodule 06/10/2017   5 mm RUL nodule noted incidentally as part of CVA workup 08/2016. With smoking history would obtain low-dose CT scan 08/2017.   . Stroke (cerebrum) (Glenn [name]) Lg L MCA infarct w/ hemorrhagic conversion, embolic d/t AF 9/98/3382  . Stroke (Glenn [name])   . Trochanteric bursitis, right hip 11/14/2018  . Visit for monitoring Tikosyn therapy 03/26/2017   Past Surgical History:  Procedure Laterality Date  . CARDIAC DEFIBRILLATOR PLACEMENT  2015  . CARDIOVERSION N/A 10/10/2016   Procedure: CARDIOVERSION;  Surgeon: Glenn Spark, MD;  Location: [place];  Service: Cardiovascular;  Laterality: N/A;  . CARDIOVERSION N/A 03/27/2017   Procedure: CARDIOVERSION;  Surgeon: Glenn Pain, MD;  Location: [name];  Service: Cardiovascular;  Laterality: N/A;  . CARDIOVERSION N/A 10/29/2018   Procedure: CARDIOVERSION;  Surgeon: Glenn Klein, MD;  Location: [place] [facility];  Service: Cardiovascular;  Laterality: N/A;  . CARDIOVERSION N/A 11/05/2018   Procedure: CARDIOVERSION;  Surgeon: Glenn Fredrickson Wonda Cheng, MD;  Location: [place];  Service: Cardiovascular;  Laterality: N/A;  . EYE SURGERY Left 1990  . IR EXCHANGE BILIARY DRAIN  02/11/2019  . IR  IVC FILTER PLMT / S&I /IMG GUID/MOD SED  12/04/2018  . IR PERC CHOLECYSTOSTOMY  12/13/2018  . IR PERCUTANEOUS ART THROMBECTOMY/INFUSION INTRACRANIAL INC DIAG ANGIO  09/05/2016  . IR RADIOLOGIST EVAL & MGMT  10/03/2016  . RADIOLOGY WITH ANESTHESIA N/A 09/05/2016   Procedure: RADIOLOGY WITH ANESTHESIA;  Surgeon: Luanne Bras, MD;  Location: [place];  Service: Radiology;  Laterality: N/A;  . RIGHT/LEFT HEART CATH AND CORONARY ANGIOGRAPHY N/A 11/03/2018   Procedure: RIGHT/LEFT HEART CATH AND CORONARY ANGIOGRAPHY;  Surgeon: Lorretta Harp, MD;  Location: [place] [facility];  Service: Cardiovascular;  Laterality: N/A;  . TEE WITHOUT CARDIOVERSION N/A 02/06/2019   Procedure: TRANSESOPHAGEAL ECHOCARDIOGRAM (TEE);  Surgeon: Skeet Latch, MD;  Location: [name];  Service: Cardiovascular;  Laterality: N/A;    Current Outpatient Medications  Medication Sig Dispense Refill  . acetaminophen (TYLENOL) 325 MG tablet Take 2 tablets (650 mg total) by mouth every 4 (four) hours as needed for mild [name] (or temp > 37.5 C (99.5 F)).    Marland Kitchen atorvastatin (LIPITOR) 20 MG tablet TAKE 1 TABLET(20 MG) BY MOUTH DAILY AT 6 PM 90 tablet 0  . calcium carbonate (TUMS - DOSED IN MG ELEMENTAL CALCIUM) 500 MG chewable tablet Chew 1 tablet (200 mg of elemental calcium total) by mouth daily  as needed for indigestion or heartburn. 30 tablet 1  . dabigatran ([name]) 150 MG CAPS capsule Take 1 capsule (150 mg total) by mouth 2 (two) times daily. 60 capsule 0  . digoxin (LANOXIN) 0.125 MG tablet Take 1 tablet (0.125 mg total) by mouth daily. 90 tablet 1  . furosemide (LASIX) 40 MG tablet Take 1 tablet (40 mg total) by mouth daily. 90 tablet 1  . gabapentin (NEURONTIN) 600 MG tablet Take 1 tablet (600 mg total) by mouth 3 (three) times daily. 60 tablet 0  . ipratropium (ATROVENT HFA) 17 MCG/ACT inhaler Inhale 2 puffs into the lungs every 6 (six) hours as needed for wheezing. 2 Inhaler 0  . levalbuterol (XOPENEX HFA) 45  MCG/ACT inhaler Inhale 1-2 puffs into the lungs every 4 (four) hours as needed for wheezing. 2 Inhaler 0  . metoprolol succinate (TOPROL-XL) 100 MG 24 hr tablet TAKE 1 TABLET(100 MG) BY MOUTH DAILY WITH OR IMMEDIATELY FOLLOWING A MEAL 90 tablet 0  . pantoprazole (PROTONIX) 40 MG tablet TAKE 1 TABLET(40 MG) BY MOUTH DAILY 90 tablet 0  . potassium chloride SA (KLOR-CON) 20 MEQ tablet TAKE 1 TABLET(20 MEQ) BY MOUTH DAILY 90 tablet 0  . sacubitril-valsartan (ENTRESTO) 24-26 MG Take 1 tablet by mouth 2 (two) times daily. 60 tablet 0  . spironolactone (ALDACTONE) 25 MG tablet TAKE 1 TABLET(25 MG) BY MOUTH DAILY 90 tablet 0  . traMADol (ULTRAM) 50 MG tablet Take 1 tablet (50 mg total) by mouth every 8 (eight) hours as needed for severe [name]. 30 tablet 0   No current facility-administered medications for this visit.    Allergies:   Benadryl [diphenhydramine]   Social History: Social History   Socioeconomic History  . Marital status: Single    Spouse name: Not on file  . Number of children: Not on file  . Years of education: 58 (some college)  . Highest education level: Not on file  Occupational History  . Occupation: disability  Tobacco Use  . Smoking status: Former Smoker    Packs/day: 0.50    Types: Cigarettes  . Smokeless tobacco: Never Used  . Tobacco comment: a pack last three days  Substance and Sexual Activity  . Alcohol use: Not Currently    Alcohol/week: 3.0 standard drinks    Types: 3 Cans of beer per week    Comment: pt stop drinking   . Drug use: Not Currently    Frequency: 2.0 times per week    Types: Marijuana    Comment: stop smoking   . Sexual activity: Yes    Partners: Female    Birth control/protection: Condom  Other Topics Concern  . Not on file  Social History Narrative  . Not on file   Social Determinants of Health   Financial Resource Strain:   . Difficulty of Paying Living Expenses:   Food Insecurity:   . Worried About Charity fundraiser in the Last  Year:   . Arboriculturist in the Last Year:   Transportation Needs:   . Film/video editor (Medical):   Marland Kitchen Lack of Transportation (Non-Medical):   Physical Activity:   . Days of Exercise per Week:   . Minutes of Exercise per Session:   Stress:   . Feeling of Stress :   Social Connections:   . Frequency of Communication with Friends and Family:   . Frequency of Social Gatherings with Friends and Family:   . Attends Religious Services:   . Active Member  of Clubs or Organizations:   . Attends Archivist Meetings:   Marland Kitchen Marital Status:   Intimate Partner Violence:   . Fear of Current or Ex-Partner:   . Emotionally Abused:   Marland Kitchen Physically Abused:   . Sexually Abused:     Family History: Family History  Problem Relation Age of Onset  . High blood pressure Mother   . High blood pressure Father   . Stroke Maternal Aunt   . Heart disease Neg Hx     Review of Systems: All other systems reviewed and are otherwise negative except as noted above.   Physical Exam: Vitals:   06/03/19 0952  BP: 132/80  Pulse: (!) 147  SpO2: 97%  Weight: 174 lb 9.6 oz (79.2 kg)  Height: 5\' 7"  (1.702 m)     GEN- The patient is well appearing, alert and oriented x 3 today.   HEENT: normocephalic, atraumatic; sclera clear, conjunctiva pink; hearing intact; oropharynx clear; neck supple, no JVP Lymph- no cervical lymphadenopathy Lungs- Clear to ausculation bilaterally, normal work of breathing.  No wheezes, rales, rhonchi Heart- Regular rate and rhythm, no murmurs, rubs or gallops, PMI not laterally displaced GI- soft, non-tender, non-distended, bowel sounds present, no hepatosplenomegaly Extremities- no clubbing, cyanosis, or edema; DP/PT/radial pulses 2+ bilaterally MS- no significant deformity or atrophy Skin- warm and dry, no rash or lesion; ICD pocket well healed Psych- euthymic mood, full affect Neuro- strength and sensation are intact  ICD interrogation- reviewed in detail  today,  See PACEART report  EKG:  EKG is ordered today. Personally review of EKG shows Atrial fibrillation with RVR at 147 bpm.   Recent Labs: 10/31/2018: TSH 2.566 06/01/2019: ALT 12; B Natriuretic Peptide 531.4; BUN 17; Creatinine, Ser 1.36; Hemoglobin 11.3; Magnesium 1.9; Platelets 153; Potassium 3.9; Sodium 139   Wt Readings from Last 3 Encounters:  06/03/19 174 lb 9.6 oz (79.2 kg)  06/01/19 172 lb (78 kg)  05/12/19 175 lb (79.4 kg)     Other studies Reviewed: Additional studies/ records that were reviewed today include: Recent ED notes, previous EP notes, previous admission notes, recent remote reports and recent labs.   Assessment and Plan:  1.  Chronic systolic heart failure s/p [employer] single chamber ICD  Volume status remains stable today after ED visit 4/12. He remains in AF with poorly controlled rhythm. NYHA III symptoms. He is not acutely decompensated at this time despite RVR.  Stable on an appropriate medical regimen Normal ICD function See Pace Art report No changes today  2. Persistent AF CHA2DS2VASC of at least 5 on [name].  He has missed several doses so will plan to proceed with TEE/DCCV. Stressed importance of compliance.  Restarted on Toprol 100 mg daily last visit. He thinks he is taking, but is going to go home and make sure. Encouraged him to bring pill bottles to next visit.  Continue digoxin 0.125 mg daily. Level stable 4/12.  Now that he has been back on pradaxa, could consider amiodarone. This was not continued after hospitalization in December as he had not been covered. He is not a candidate with tikosyn with very poor compliance.   3. Cholecystitis s/p cholecystostomy tube Seen by PCP 03/2019 and told to contact IR to have removed. Pt states he did and was told "if they took it out too early, they would have to put it back in". Encouraged patient to follow up for further.   Current medicines are reviewed at length with the patient today.  The patient does not have concerns regarding his medicines.  The following changes were made today:  none  Labs/ tests ordered today include:  Orders Placed This Encounter  Procedures  . Basic Metabolic Panel (BMET)  . CBC  . EKG 12-Lead    Disposition:   Plan TEE/DCCV next week, then AF clinic follow up. Would plan follow up with Dr. Caryl [name] several weeks after that to further discuss this very complicated patient.   Jacalyn Lefevre, PA-C  06/03/2019 10:41 AM  [name] [address] [place] [address] [phone] (office) [phone] (fax)

## 2019-06-03 NOTE — Patient Instructions (Addendum)
Medication Instructions:  none *If you need a refill on your cardiac medications before your next appointment, please call your pharmacy*   Lab Work:  TODAY BMET CBC If you have labs (blood work) drawn today and your tests are completely normal, you will receive your results only by: Marland Kitchen MyChart Message (if you have MyChart) OR . A paper copy in the mail If you have any lab test that is abnormal or we need to change your treatment, we will call you to review the results.   Testing/Procedures: Your physician has requested that you have a TEE. During a TEE, sound waves are used to create images of your heart. It provides your doctor with information about the size and shape of your heart and how well your heart's chambers and valves are working. In this test, a transducer is attached to the end of a flexible tube that's guided down your throat and into your esophagus (the tube leading from you mouth to your stomach) to get a more detailed image of your heart. You are not awake for the procedure. Please see the instruction sheet given to you today. For further information please visit [email or web address].  Your physician has recommended that you have a Cardioversion (DCCV). Electrical Cardioversion uses a jolt of electricity to your heart either through paddles or wired patches attached to your chest. This is a controlled, usually prescheduled, procedure. Defibrillation is done under light anesthesia in the hospital, and you usually go home the day of the procedure. This is done to get your heart back into a normal rhythm. You are not awake for the procedure. Please see the instruction sheet given to you today.      Follow-Up:  Please schedule Afib Clinic after 06/09/19 At [facility], you and your health needs are our priority.  As part of our continuing mission to provide you with exceptional heart care, we have created designated Provider Care Teams.  These Care Teams include your primary  Cardiologist (physician) and Advanced Practice Providers (APPs -  Physician Assistants and Nurse Practitioners) who all work together to provide you with the care you need, when you need it.   Your next appointment:   4 weeks  The format for your next appointment:   Either In Person or Virtual  Provider:   Dr [name]   Other Instructions Remote monitoring is used to monitor your ICD from home. This monitoring reduces the number of office visits required to check your device to one time per year. It allows [place] to keep an eye on the functioning of your device to ensure it is working properly. You are scheduled for a device check from home on 08/07/19. You may send your transmission at any time that day. If you have a wireless device, the transmission will be sent automatically. After your physician reviews your transmission, you will receive a postcard with your next transmission date.  Dear Mr Cozzolino You are scheduled for a TEE/Cardioversion  with Dr. Harrell Gave 06/09/19 Tuesday .  Please arrive at the [facility] (Main Entrance A) at [facility]: [address] [place], [place] [address] at 8:30 am.  DIET: Nothing to eat or drink after midnight except a sip of water with medications (see medication instructions below)  Medication Instructions: Hold Lasix and Spironolactone  Continue your anticoagulant: [name] You will need to continue your anticoagulant after your procedure until you  are told by your  Provider that it is safe to stop   You must  have a responsible person to drive you home and stay in the waiting area during your procedure. Failure to do so could result in cancellation.  Bring your insurance cards.  *Special Note: Every effort is made to have your procedure done on time. Occasionally there are emergencies that occur at the hospital that may cause delays. Please be patient if a delay does occur.   CoVid Test:  Friday 4/16 @10 :00 am                       [facility]                      [address]

## 2019-06-03 NOTE — Addendum Note (Signed)
Addended by: Jennette Banker on: 06/03/2019 09:21 AM   Modules accepted: Level of Service

## 2019-06-05 ENCOUNTER — Telehealth: Payer: Self-pay

## 2019-06-05 ENCOUNTER — Other Ambulatory Visit (HOSPITAL_COMMUNITY)
Admission: RE | Admit: 2019-06-05 | Discharge: 2019-06-05 | Disposition: A | Discharge status: Home / Self Care | Payer: Medicare (Managed Care) | Source: Ambulatory Visit | Attending: Cardiology | Admitting: Cardiology

## 2019-06-05 ENCOUNTER — Other Ambulatory Visit (HOSPITAL_COMMUNITY): Payer: Medicare (Managed Care)

## 2019-06-05 DIAGNOSIS — Z01812 Encounter for preprocedural laboratory examination: Secondary | ICD-10-CM | POA: Insufficient documentation

## 2019-06-05 DIAGNOSIS — Z20822 Contact with and (suspected) exposure to covid-19: Secondary | ICD-10-CM | POA: Diagnosis not present

## 2019-06-05 LAB — SARS CORONAVIRUS 2 (TAT 6-24 HRS): SARS Coronavirus 2: NEGATIVE

## 2019-06-05 NOTE — Telephone Encounter (Signed)
I spoke to the patient and reminded him of [place] Screening appointment today.  He verbalized understanding.

## 2019-06-05 NOTE — Telephone Encounter (Signed)
Spoke to patient

## 2019-06-08 ENCOUNTER — Encounter: Payer: Medicare (Managed Care) | Admitting: Physician Assistant

## 2019-06-09 ENCOUNTER — Encounter (HOSPITAL_COMMUNITY): Payer: Self-pay | Admitting: Cardiology

## 2019-06-09 ENCOUNTER — Other Ambulatory Visit: Payer: Self-pay | Admitting: Radiology

## 2019-06-09 ENCOUNTER — Ambulatory Visit (HOSPITAL_COMMUNITY): Payer: Medicare (Managed Care) | Admitting: Certified Registered Nurse Anesthetist

## 2019-06-09 ENCOUNTER — Other Ambulatory Visit: Payer: Self-pay

## 2019-06-09 ENCOUNTER — Ambulatory Visit (HOSPITAL_BASED_OUTPATIENT_CLINIC_OR_DEPARTMENT_OTHER)
Admission: RE | Admit: 2019-06-09 | Discharge: 2019-06-09 | Disposition: A | Discharge status: Home / Self Care | Payer: Medicare (Managed Care) | Source: Ambulatory Visit | Attending: Student | Admitting: Student

## 2019-06-09 ENCOUNTER — Ambulatory Visit (HOSPITAL_COMMUNITY)
Admission: RE | Admit: 2019-06-09 | Discharge: 2019-06-09 | Disposition: A | Discharge status: Home / Self Care | Payer: Medicare (Managed Care) | Source: Home / Self Care | Attending: Radiology | Admitting: Radiology

## 2019-06-09 ENCOUNTER — Encounter (HOSPITAL_COMMUNITY)
Admission: RE | Disposition: A | Discharge status: Still In Hospital | Payer: Self-pay | Source: Home / Self Care | Attending: Cardiology

## 2019-06-09 ENCOUNTER — Ambulatory Visit (HOSPITAL_COMMUNITY)
Admission: RE | Admit: 2019-06-09 | Discharge: 2019-06-09 | Disposition: A | Discharge status: Still In Hospital | Payer: Medicare (Managed Care) | Attending: Cardiology | Admitting: Cardiology

## 2019-06-09 DIAGNOSIS — J449 Chronic obstructive pulmonary disease, unspecified: Secondary | ICD-10-CM | POA: Diagnosis not present

## 2019-06-09 DIAGNOSIS — B1920 Unspecified viral hepatitis C without hepatic coma: Secondary | ICD-10-CM | POA: Diagnosis not present

## 2019-06-09 DIAGNOSIS — Z888 Allergy status to other drugs, medicaments and biological substances status: Secondary | ICD-10-CM | POA: Insufficient documentation

## 2019-06-09 DIAGNOSIS — Z87891 Personal history of nicotine dependence: Secondary | ICD-10-CM | POA: Diagnosis not present

## 2019-06-09 DIAGNOSIS — K819 Cholecystitis, unspecified: Secondary | ICD-10-CM | POA: Insufficient documentation

## 2019-06-09 DIAGNOSIS — Z9119 Patient's noncompliance with other medical treatment and regimen: Secondary | ICD-10-CM | POA: Diagnosis not present

## 2019-06-09 DIAGNOSIS — T85628A Displacement of other specified internal prosthetic devices, implants and grafts, initial encounter: Secondary | ICD-10-CM | POA: Diagnosis not present

## 2019-06-09 DIAGNOSIS — N179 Acute kidney failure, unspecified: Secondary | ICD-10-CM | POA: Insufficient documentation

## 2019-06-09 DIAGNOSIS — I5042 Chronic combined systolic (congestive) and diastolic (congestive) heart failure: Secondary | ICD-10-CM | POA: Insufficient documentation

## 2019-06-09 DIAGNOSIS — Z7901 Long term (current) use of anticoagulants: Secondary | ICD-10-CM | POA: Insufficient documentation

## 2019-06-09 DIAGNOSIS — I4819 Other persistent atrial fibrillation: Secondary | ICD-10-CM

## 2019-06-09 DIAGNOSIS — I351 Nonrheumatic aortic (valve) insufficiency: Secondary | ICD-10-CM

## 2019-06-09 DIAGNOSIS — N1831 Chronic kidney disease, stage 3a: Secondary | ICD-10-CM | POA: Insufficient documentation

## 2019-06-09 DIAGNOSIS — I420 Dilated cardiomyopathy: Secondary | ICD-10-CM | POA: Diagnosis not present

## 2019-06-09 DIAGNOSIS — E785 Hyperlipidemia, unspecified: Secondary | ICD-10-CM | POA: Diagnosis not present

## 2019-06-09 DIAGNOSIS — I340 Nonrheumatic mitral (valve) insufficiency: Secondary | ICD-10-CM

## 2019-06-09 DIAGNOSIS — I083 Combined rheumatic disorders of mitral, aortic and tricuspid valves: Secondary | ICD-10-CM | POA: Diagnosis not present

## 2019-06-09 DIAGNOSIS — Z95810 Presence of automatic (implantable) cardiac defibrillator: Secondary | ICD-10-CM | POA: Insufficient documentation

## 2019-06-09 DIAGNOSIS — I513 Intracardiac thrombosis, not elsewhere classified: Secondary | ICD-10-CM | POA: Diagnosis not present

## 2019-06-09 DIAGNOSIS — Z86718 Personal history of other venous thrombosis and embolism: Secondary | ICD-10-CM | POA: Diagnosis not present

## 2019-06-09 DIAGNOSIS — I130 Hypertensive heart and chronic kidney disease with heart failure and stage 1 through stage 4 chronic kidney disease, or unspecified chronic kidney disease: Secondary | ICD-10-CM | POA: Insufficient documentation

## 2019-06-09 DIAGNOSIS — Z8673 Personal history of transient ischemic attack (TIA), and cerebral infarction without residual deficits: Secondary | ICD-10-CM | POA: Insufficient documentation

## 2019-06-09 DIAGNOSIS — I700 Atherosclerosis of aorta: Secondary | ICD-10-CM | POA: Diagnosis not present

## 2019-06-09 DIAGNOSIS — I255 Ischemic cardiomyopathy: Secondary | ICD-10-CM | POA: Diagnosis not present

## 2019-06-09 DIAGNOSIS — Z79899 Other long term (current) drug therapy: Secondary | ICD-10-CM | POA: Insufficient documentation

## 2019-06-09 DIAGNOSIS — Z823 Family history of stroke: Secondary | ICD-10-CM | POA: Insufficient documentation

## 2019-06-09 DIAGNOSIS — Z8249 Family history of ischemic heart disease and other diseases of the circulatory system: Secondary | ICD-10-CM | POA: Insufficient documentation

## 2019-06-09 HISTORY — PX: IR EXCHANGE BILIARY DRAIN: IMG6046

## 2019-06-09 HISTORY — PX: TEE WITHOUT CARDIOVERSION: SHX5443

## 2019-06-09 SURGERY — ECHOCARDIOGRAM, TRANSESOPHAGEAL
Anesthesia: General

## 2019-06-09 MED ORDER — SODIUM CHLORIDE 0.9 % IV SOLN: INTRAVENOUS | Status: DC

## 2019-06-09 MED ORDER — BUTAMBEN-TETRACAINE-BENZOCAINE 2-2-14 % EX AERO
INHALATION_SPRAY | CUTANEOUS | Status: DC | PRN
  Administered 2019-06-09: 2 via TOPICAL

## 2019-06-09 MED ORDER — PROPOFOL 500 MG/50ML IV EMUL
INTRAVENOUS | Status: DC | PRN
  Administered 2019-06-09: 30 ug/kg/min via INTRAVENOUS

## 2019-06-09 MED ORDER — LIDOCAINE HCL (PF) 1 % IJ SOLN
INTRAMUSCULAR | Status: DC | PRN
  Administered 2019-06-09: 10 mL

## 2019-06-09 MED ORDER — PROPOFOL 10 MG/ML IV BOLUS
INTRAVENOUS | Status: DC | PRN
  Administered 2019-06-09 (×3): 10 mg via INTRAVENOUS

## 2019-06-09 MED ORDER — SODIUM CHLORIDE 0.9 % IV SOLN: INTRAVENOUS | Status: DC | PRN

## 2019-06-09 MED ORDER — LIDOCAINE HCL 1 % IJ SOLN
INTRAMUSCULAR | Status: AC
  Filled 2019-06-09: qty 20

## 2019-06-09 MED ORDER — DABIGATRAN ETEXILATE MESYLATE 150 MG PO CAPS
150.0000 mg | ORAL_CAPSULE | Freq: Once | ORAL | Status: AC
  Administered 2019-06-09: 10:00:00 150 mg via ORAL
  Filled 2019-06-09: qty 1

## 2019-06-09 MED ORDER — IOHEXOL 300 MG/ML  SOLN
50.0000 mL | Freq: Once | INTRAMUSCULAR | Status: AC | PRN
  Administered 2019-06-09: 5 mL

## 2019-06-09 NOTE — Anesthesia Postprocedure Evaluation (Signed)
Anesthesia Post Note  Patient: Glenn [name]  Procedure(s) Performed: TRANSESOPHAGEAL ECHOCARDIOGRAM (TEE) (N/A )     Patient location during evaluation: PACU Anesthesia Type: MAC Level of consciousness: awake and alert Pain management: pain level controlled Vital Signs Assessment: post-procedure vital signs reviewed and stable Respiratory status: spontaneous breathing, nonlabored ventilation, respiratory function stable and patient connected to nasal cannula oxygen Cardiovascular status: stable and blood pressure returned to baseline Postop Assessment: no apparent nausea or vomiting Anesthetic complications: no    Last Vitals:  Vitals:   06/09/19 1019 06/09/19 1030  BP: (!) 149/76 (!) 149/77  Pulse: (!) 49 (!) 102  Resp: 17 15  Temp:    SpO2: 93% 98%    Last Pain:  Vitals:   06/09/19 1030  TempSrc:   PainSc: 7                  Jazzmine Kleiman DAVID

## 2019-06-09 NOTE — Interval H&P Note (Signed)
History and Physical Interval Note:  06/09/2019 9:27 AM  Glenn [name]  has presented today for surgery, with the diagnosis of AFIB.  The various methods of treatment have been discussed with the patient and family. After consideration of risks, benefits and other options for treatment, the patient has consented to  Procedure(s): TRANSESOPHAGEAL ECHOCARDIOGRAM (TEE) (N/A) CARDIOVERSION (N/A) as a surgical intervention.  The patient's history has been reviewed, patient examined, no change in status, stable for surgery.  I have reviewed the patient's chart and labs.  Questions were answered to the patient's satisfaction.     Everado Pillsbury Harrell Gave

## 2019-06-09 NOTE — CV Procedure (Signed)
    TRANSESOPHAGEAL ECHOCARDIOGRAM   NAME:  Glenn [name]   MRN: [id number] DOB:  12-28-1950   ADMIT DATE: 06/09/2019  INDICATIONS: Atrial fibrillation with RVR  PROCEDURE:   Informed consent was obtained prior to the procedure. The risks, benefits and alternatives for the procedure were discussed and the patient comprehended these risks.  Risks include, but are not limited to, cough, sore throat, vomiting, nausea, somnolence, esophageal and stomach trauma or perforation, bleeding, low blood pressure, aspiration, pneumonia, infection, trauma to the teeth and death.    Procedural time out performed. The oropharynx was anesthetized with topical 1% benzocaine.    Monitored anesthesia care administered under the supervision of Dr. Conrad Wapella. He recevied 70 mg propofol.  The transesophageal probe was inserted in the esophagus and stomach without difficulty and multiple views were obtained.    COMPLICATIONS:    There were no immediate complications.  FINDINGS:  LEFT VENTRICLE: EF = <15%. Global hypokinesis.  RIGHT VENTRICLE: Normal size and mildly reduced function.   LEFT ATRIUM: No thrombus/mass. Dilated.  LEFT ATRIAL APPENDAGE: There are two distinct well organized thrombi seen in the LAA.  RIGHT ATRIUM: No thrombus/mass. Chiari network. Dilated.  AORTIC VALVE:  Trileaflet. Mild-moderate regurgitation. No vegetation.  MITRAL VALVE:    Normal structure. Mild regurgitation. No vegetation.  TRICUSPID VALVE: Normal structure. Moderate eccentric regurgitation. No vegetation.  PULMONIC VALVE: Grossly normal structure. Mild-moderate regurgitation. No apparent vegetation.  INTERATRIAL SEPTUM: No PFO or ASD seen by color Doppler.  PERICARDIUM: No effusion noted.  DESCENDING AORTA: Moderate diffuse plaque seen   CONCLUSION: Severely reduced LVEF. Two separate, well organized thrombi seen in LAA. No cardioversion performed. Given persistent thrombi and afib RVR without ability to  cardiovert, contacted both afib clinic and Glenn [name] to alert them to results.   Buford Dresser, MD, PhD [facility]  [address] [place], [place] [address] [phone]   10:06 AM

## 2019-06-09 NOTE — Transfer of Care (Signed)
Immediate Anesthesia Transfer of Care Note  Patient: Isabella Ida Pons  Procedure(s) Performed: TRANSESOPHAGEAL ECHOCARDIOGRAM (TEE) (N/A )  Patient Location: Endoscopy Unit  Anesthesia Type:MAC  Level of Consciousness: awake and drowsy  Airway & Oxygen Therapy: Patient Spontanous Breathing and Patient connected to nasal cannula oxygen  Post-op Assessment: Report given to RN, Post -op Vital signs reviewed and stable and Patient moving all extremities X 4  Post vital signs: Reviewed and stable  Last Vitals:  Vitals Value Taken Time  BP 125/82 06/09/19 1009  Temp    Pulse 47 06/09/19 1010  Resp 33 06/09/19 1010  SpO2 100 % 06/09/19 1010  Vitals shown include unvalidated device data.  Last Pain:  Vitals:   06/09/19 0847  TempSrc: Oral  PainSc: 0-No pain         Complications: No apparent anesthesia complications

## 2019-06-09 NOTE — Anesthesia Procedure Notes (Signed)
Procedure Name: General with mask airway Date/Time: 06/09/2019 9:40 AM Performed by: Harden Mo, CRNA Pre-anesthesia Checklist: Patient identified, Emergency Drugs available, Suction available, Patient being monitored and Timeout performed Patient Re-evaluated:Patient Re-evaluated prior to induction Oxygen Delivery Method: Nasal cannula Preoxygenation: Pre-oxygenation with 100% oxygen Induction Type: IV induction Placement Confirmation: positive ETCO2 and breath sounds checked- equal and bilateral Dental Injury: Teeth and Oropharynx as per pre-operative assessment

## 2019-06-09 NOTE — Progress Notes (Signed)
  Echocardiogram Echocardiogram Transesophageal has been performed.  Glenn [name] 06/09/2019, 10:07 AM

## 2019-06-09 NOTE — Progress Notes (Signed)
Pt ride after procedure, [place], [phone]

## 2019-06-09 NOTE — Anesthesia Preprocedure Evaluation (Signed)
Anesthesia Evaluation  Patient identified by MRN, date of birth, ID band Patient awake    Reviewed: Allergy & Precautions, NPO status , Patient's Chart, lab work & pertinent test results  Airway Mallampati: I  TM Distance: >3 FB Neck ROM: Full    Dental   Pulmonary Patient abstained from smoking., former smoker,    Pulmonary exam normal        Cardiovascular hypertension, Pt. on medications Normal cardiovascular exam     Neuro/Psych CVA    GI/Hepatic (+) Hepatitis -, C  Endo/Other    Renal/GU      Musculoskeletal   Abdominal   Peds  Hematology   Anesthesia Other Findings   Reproductive/Obstetrics                             Anesthesia Physical Anesthesia Plan  ASA: III  Anesthesia Plan: MAC   Post-op Pain Management:    Induction: Intravenous  PONV Risk Score and Plan: 1 and Ondansetron  Airway Management Planned: Nasal Cannula  Additional Equipment:   Intra-op Plan:   Post-operative Plan:   Informed Consent: I have reviewed the patients History and Physical, chart, labs and discussed the procedure including the risks, benefits and alternatives for the proposed anesthesia with the patient or authorized representative who has indicated his/her understanding and acceptance.       Plan Discussed with: CRNA and Surgeon  Anesthesia Plan Comments:         Anesthesia Quick Evaluation

## 2019-06-09 NOTE — Progress Notes (Unsigned)
Pt known to IR for perc chole drain Scheduled today for TEE and cardioversion.  He hasn't had followed up with surgery and was seen by IR for chole tube change back in December. He reports his drain has come out some and has very little output. IR is asked to assess while he is here.  Tube appears intact though suture has pulled and drain appears retracted about 5-6 cm. Attempted flush but immediate leakage of flush beck from skin site. Concern that tube is no longer in GB. Will plan for cholangiogram with poss tube removal v exchange after completion of his TEE procedure today.  Ascencion Dike PA-C Interventional Radiology 06/09/2019 10:04 AM

## 2019-06-16 ENCOUNTER — Other Ambulatory Visit (HOSPITAL_COMMUNITY): Payer: Self-pay | Admitting: Nurse Practitioner

## 2019-06-16 ENCOUNTER — Encounter (HOSPITAL_COMMUNITY): Payer: Self-pay | Admitting: Nurse Practitioner

## 2019-06-16 ENCOUNTER — Ambulatory Visit (HOSPITAL_COMMUNITY)
Admission: RE | Admit: 2019-06-16 | Discharge: 2019-06-16 | Disposition: A | Discharge status: Home / Self Care | Payer: Medicare (Managed Care) | Source: Ambulatory Visit | Attending: Nurse Practitioner | Admitting: Nurse Practitioner

## 2019-06-16 ENCOUNTER — Other Ambulatory Visit: Payer: Self-pay

## 2019-06-16 VITALS — BP 168/86 | HR 139 | Ht 67.0 in | Wt 176.4 lb

## 2019-06-16 DIAGNOSIS — I130 Hypertensive heart and chronic kidney disease with heart failure and stage 1 through stage 4 chronic kidney disease, or unspecified chronic kidney disease: Secondary | ICD-10-CM | POA: Insufficient documentation

## 2019-06-16 DIAGNOSIS — J449 Chronic obstructive pulmonary disease, unspecified: Secondary | ICD-10-CM | POA: Diagnosis not present

## 2019-06-16 DIAGNOSIS — E785 Hyperlipidemia, unspecified: Secondary | ICD-10-CM | POA: Insufficient documentation

## 2019-06-16 DIAGNOSIS — I4819 Other persistent atrial fibrillation: Secondary | ICD-10-CM | POA: Diagnosis not present

## 2019-06-16 DIAGNOSIS — Z8673 Personal history of transient ischemic attack (TIA), and cerebral infarction without residual deficits: Secondary | ICD-10-CM | POA: Diagnosis not present

## 2019-06-16 DIAGNOSIS — Z87891 Personal history of nicotine dependence: Secondary | ICD-10-CM | POA: Diagnosis not present

## 2019-06-16 DIAGNOSIS — Z95810 Presence of automatic (implantable) cardiac defibrillator: Secondary | ICD-10-CM | POA: Insufficient documentation

## 2019-06-16 DIAGNOSIS — Z79899 Other long term (current) drug therapy: Secondary | ICD-10-CM | POA: Diagnosis not present

## 2019-06-16 DIAGNOSIS — Z8249 Family history of ischemic heart disease and other diseases of the circulatory system: Secondary | ICD-10-CM | POA: Insufficient documentation

## 2019-06-16 DIAGNOSIS — I252 Old myocardial infarction: Secondary | ICD-10-CM | POA: Insufficient documentation

## 2019-06-16 DIAGNOSIS — Z9114 Patient's other noncompliance with medication regimen: Secondary | ICD-10-CM | POA: Insufficient documentation

## 2019-06-16 DIAGNOSIS — Z86718 Personal history of other venous thrombosis and embolism: Secondary | ICD-10-CM | POA: Diagnosis not present

## 2019-06-16 DIAGNOSIS — N1831 Chronic kidney disease, stage 3a: Secondary | ICD-10-CM | POA: Insufficient documentation

## 2019-06-16 DIAGNOSIS — I5042 Chronic combined systolic (congestive) and diastolic (congestive) heart failure: Secondary | ICD-10-CM | POA: Insufficient documentation

## 2019-06-16 DIAGNOSIS — D6869 Other thrombophilia: Secondary | ICD-10-CM | POA: Diagnosis not present

## 2019-06-16 MED ORDER — SPIRONOLACTONE 25 MG PO TABS: 25.0000 mg | ORAL_TABLET | Freq: Every day | ORAL | 0 refills | Status: DC

## 2019-06-16 MED ORDER — DABIGATRAN ETEXILATE MESYLATE 150 MG PO CAPS: 150.0000 mg | ORAL_CAPSULE | Freq: Two times a day (BID) | ORAL | 0 refills | Status: DC

## 2019-06-16 MED ORDER — METOPROLOL SUCCINATE ER 100 MG PO TB24: ORAL_TABLET | ORAL | 0 refills | Status: DC

## 2019-06-16 MED ORDER — FUROSEMIDE 40 MG PO TABS: ORAL_TABLET | ORAL | 0 refills | Status: DC

## 2019-06-16 NOTE — Progress Notes (Addendum)
Primary Care Physician: [name], MD Referring Physician: Dr. Siri Cole Slaugh is a 69 y.o. male with a h/o persistent afib, Tikosyn stopped last year after CVA and prolonged qt,recent string of complicated admissions for CHF, COPD, CVA. He was found to have an atrial clot several months ago. After change from  eliquis to [name] he recently had a TEE guided cardioversion scheduled but the cardioversion was cancelled after the TEE found 2 separate well organized thrombi seen in LAA. EF was 15%. HE was referred here for f/u with a visit with Dr. [name] MAy 3rd.    On  f/u in the afib clinic, finds that the pt has been out of his metoprolol and lasix for several weeks as well as spironolactone and [name].  He is in afib with RVR at 139 bpm and BP is 168/86. Left arm/left leg( involved side from  CVA) is swollen.  He is planning to go to the drug store right after this appointment.  He states that he is taking pradaxa but per pharmacy, he has been  out of this since April 9th.  I offered to change him to daily xarelto but he states that he would prefer to stay with pradaxa. He states that he has been taking 1-2 tabs of lasix as he feels that he needs for swelling. He also has been out of  Spironolactone for several weeks per pharmacy.    Today, he denies symptoms of palpitations, chest pain, shortness of breath, orthopnea, PND, lower extremity edema, dizziness, presyncope, syncope, or neurologic sequela. The patient is tolerating medications without difficulties and is otherwise without complaint today.   Past Medical History:  Diagnosis Date   Acute blood loss anemia    Acute CVA (cerebrovascular accident) ([place])    Acute deep vein thrombosis (DVT) of both lower extremities ([name])    Acute kidney injury ([place])    Acute on chronic combined systolic and diastolic CHF (congestive heart failure) ([name])    Acute renal failure superimposed on stage 3a chronic kidney disease ([name])      Acute respiratory failure with hypoxia ([name])    Atrial fibrillation ([name])    Carpal tunnel syndrome of right wrist 02/28/2018   Cerebral edema ([place]) 11/13/2018   Cerebral infarction ([name])    CHF (congestive heart failure) ([place])    Cholecystitis 02/04/2019   Chronic right hip pain    DCM (dilated cardiomyopathy) ([name])    Dysphagia, post-stroke    Elevated troponin    Entrapment of right ulnar nerve 02/28/2018   Headache due to intracranial disease 11/14/2018   Hepatitis C    History of hemorrhagic stroke with residual hemiparesis ([place]) 02/04/2019   HTN (hypertension) 08/14/2016   Hyperlipidemia LDL goal <70 11/13/2018   Hypertension    ICD (implantable cardioverter-defibrillator) in place 09/13/2016   Impotence due to erectile dysfunction 09/30/2017   Ischemic cardiomyopathy    Labile blood glucose    Left leg DVT ([place]) 02/04/2019   Marijuana user 11/13/2018   Paroxysmal atrial fibrillation ([place])    Right middle cerebral artery stroke ([place]) 11/13/2018   Solitary pulmonary nodule 06/10/2017   5 mm RUL nodule noted incidentally as part of CVA workup 08/2016. With smoking history would obtain low-dose CT scan 08/2017.    Stroke (cerebrum) ([name]) Lg L MCA infarct w/ hemorrhagic conversion, embolic d/t AF 3/55/9741   Stroke [facility])    Trochanteric bursitis, right hip 11/14/2018   Visit for monitoring Tikosyn therapy 03/26/2017  Past Surgical History:  Procedure Laterality Date   CARDIAC DEFIBRILLATOR PLACEMENT  2015   CARDIOVERSION N/A 10/10/2016   Procedure: CARDIOVERSION;  Surgeon: Dorothy Spark, MD;  Location: [facility];  Service: Cardiovascular;  Laterality: N/A;   CARDIOVERSION N/A 03/27/2017   Procedure: CARDIOVERSION;  Surgeon: Jerline Pain, MD;  Location: [facility];  Service: Cardiovascular;  Laterality: N/A;   CARDIOVERSION N/A 10/29/2018   Procedure: CARDIOVERSION;  Surgeon: Sanda Klein, MD;  Location: [facility];  Service:  Cardiovascular;  Laterality: N/A;   CARDIOVERSION N/A 11/05/2018   Procedure: CARDIOVERSION;  Surgeon: Acie Fredrickson Wonda Cheng, MD;  Location: [facility];  Service: Cardiovascular;  Laterality: N/A;   EYE SURGERY Left 1990   IR EXCHANGE BILIARY DRAIN  02/11/2019   IR EXCHANGE BILIARY DRAIN  06/09/2019   IR IVC FILTER PLMT / S&I /IMG GUID/MOD SED  12/04/2018   IR PERC CHOLECYSTOSTOMY  12/13/2018   IR PERCUTANEOUS ART THROMBECTOMY/INFUSION INTRACRANIAL INC DIAG ANGIO  09/05/2016   IR RADIOLOGIST EVAL & MGMT  10/03/2016   RADIOLOGY WITH ANESTHESIA N/A 09/05/2016   Procedure: RADIOLOGY WITH ANESTHESIA;  Surgeon: Luanne Bras, MD;  Location: [facility];  Service: Radiology;  Laterality: N/A;   RIGHT/LEFT HEART CATH AND CORONARY ANGIOGRAPHY N/A 11/03/2018   Procedure: RIGHT/LEFT HEART CATH AND CORONARY ANGIOGRAPHY;  Surgeon: Lorretta Harp, MD;  Location: [facility];  Service: Cardiovascular;  Laterality: N/A;   TEE WITHOUT CARDIOVERSION N/A 02/06/2019   Procedure: TRANSESOPHAGEAL ECHOCARDIOGRAM (TEE);  Surgeon: Skeet Latch, MD;  Location: [facility];  Service: Cardiovascular;  Laterality: N/A;   TEE WITHOUT CARDIOVERSION N/A 06/09/2019   Procedure: TRANSESOPHAGEAL ECHOCARDIOGRAM (TEE);  Surgeon: Buford Dresser, MD;  Location: [facility] ENDOSCOPY;  Service: Cardiovascular;  Laterality: N/A;    Current Outpatient Medications  Medication Sig Dispense Refill   acetaminophen (TYLENOL) 325 MG tablet Take 2 tablets (650 mg total) by mouth every 4 (four) hours as needed for mild pain (or temp > 37.5 C (99.5 F)).     atorvastatin (LIPITOR) 20 MG tablet TAKE 1 TABLET(20 MG) BY MOUTH DAILY AT 6 PM 90 tablet 0   calcium carbonate (TUMS - DOSED IN MG ELEMENTAL CALCIUM) 500 MG chewable tablet Chew 1 tablet (200 mg of elemental calcium total) by mouth daily as needed for indigestion or heartburn. 30 tablet 1   dabigatran ([name]) 150 MG CAPS capsule Take 1 capsule (150 mg total) by  mouth 2 (two) times daily. 60 capsule 0   digoxin (LANOXIN) 0.125 MG tablet Take 1 tablet (0.125 mg total) by mouth daily. 90 tablet 1   furosemide (LASIX) 40 MG tablet Take 1 tablet (40 mg total) by mouth daily. (Patient taking differently: Take 80 mg by mouth daily. ) 90 tablet 1   gabapentin (NEURONTIN) 600 MG tablet Take 1 tablet (600 mg total) by mouth 3 (three) times daily. 60 tablet 0   metoprolol succinate (TOPROL-XL) 100 MG 24 hr tablet TAKE 1 TABLET(100 MG) BY MOUTH DAILY WITH OR IMMEDIATELY FOLLOWING A MEAL 90 tablet 0   pantoprazole (PROTONIX) 40 MG tablet TAKE 1 TABLET(40 MG) BY MOUTH DAILY 90 tablet 0   potassium chloride SA (KLOR-CON) 20 MEQ tablet TAKE 1 TABLET(20 MEQ) BY MOUTH DAILY 90 tablet 0   sacubitril-valsartan (ENTRESTO) 24-26 MG Take 1 tablet by mouth 2 (two) times daily. 60 tablet 0   spironolactone (ALDACTONE) 25 MG tablet TAKE 1 TABLET(25 MG) BY MOUTH DAILY 90 tablet 0   traMADol (ULTRAM) 50 MG tablet Take 1 tablet (50  mg total) by mouth every 8 (eight) hours as needed for severe pain. 30 tablet 0   albuterol (VENTOLIN HFA) 108 (90 Base) MCG/ACT inhaler SMARTSIG:2 Puff(s) By Mouth Every 4-6 Hours PRN     ipratropium (ATROVENT HFA) 17 MCG/ACT inhaler Inhale 2 puffs into the lungs every 6 (six) hours as needed for wheezing. (Patient not taking: Reported on 06/16/2019) 2 Inhaler 0   levalbuterol (XOPENEX HFA) 45 MCG/ACT inhaler Inhale 1-2 puffs into the lungs every 4 (four) hours as needed for wheezing. (Patient not taking: Reported on 06/16/2019) 2 Inhaler 0   No current facility-administered medications for this encounter.    Allergies  Allergen Reactions   Benadryl [Diphenhydramine] Palpitations    Social History   Socioeconomic History   Marital status: Single    Spouse name: Not on file   Number of children: Not on file   Years of education: 64 (some college)   Highest education level: Not on file  Occupational History   Occupation:  disability  Tobacco Use   Smoking status: Former Smoker    Packs/day: 0.50    Types: Cigarettes   Smokeless tobacco: Never Used   Tobacco comment: a pack last three days  Substance and Sexual Activity   Alcohol use: Not Currently    Alcohol/week: 3.0 standard drinks    Types: 3 Cans of beer per week    Comment: pt stop drinking    Drug use: Not Currently    Frequency: 2.0 times per week    Types: Marijuana    Comment: stop smoking    Sexual activity: Yes    Partners: Female    Birth control/protection: Condom  Other Topics Concern   Not on file  Social History Narrative   Not on file   Social Determinants of Health   Financial Resource Strain:    Difficulty of Paying Living Expenses:   Food Insecurity:    Worried About Charity fundraiser in the Last Year:    Arboriculturist in the Last Year:   Transportation Needs:    Film/video editor (Medical):    Lack of Transportation (Non-Medical):   Physical Activity:    Days of Exercise per Week:    Minutes of Exercise per Session:   Stress:    Feeling of Stress :   Social Connections:    Frequency of Communication with Friends and Family:    Frequency of Social Gatherings with Friends and Family:    Attends Religious Services:    Active Member of Clubs or Organizations:    Attends Music therapist:    Marital Status:   Intimate Partner Violence:    Fear of Current or Ex-Partner:    Emotionally Abused:    Physically Abused:    Sexually Abused:     Family History  Problem Relation Age of Onset   High blood pressure Mother    High blood pressure Father    Stroke Maternal Aunt    Heart disease Neg Hx     ROS- All systems are reviewed and negative except as per the HPI above  Physical Exam: Vitals:   06/16/19 0924  BP: (!) 168/86  Pulse: (!) 139  Weight: 80 kg  Height: 5\' 7"  (1.702 m)   Wt Readings from Last 3 Encounters:  06/16/19 80 kg  06/03/19 79.2 kg    06/01/19 78 kg    Labs: Lab Results  Component Value Date   NA 142 06/03/2019   K  3.9 06/03/2019   CL 103 06/03/2019   CO2 24 06/03/2019   GLUCOSE 108 (H) 06/03/2019   BUN 17 06/03/2019   CREATININE 1.51 (H) 06/03/2019   CALCIUM 9.6 06/03/2019   PHOS 3.4 12/14/2018   MG 1.9 06/01/2019   Lab Results  Component Value Date   INR 1.3 (H) 06/01/2019   Lab Results  Component Value Date   CHOL 135 11/11/2018   HDL 24 (L) 11/11/2018   LDLCALC 96 11/11/2018   TRIG 75 11/11/2018     GEN- The patient is well appearing, alert and oriented x 3 today.   Head- normocephalic, atraumatic Eyes-  Sclera clear, conjunctiva pink Ears- hearing intact Oropharynx- clear Neck- supple, no JVP Lymph- no cervical lymphadenopathy Lungs- Clear to ausculation bilaterally, normal work of breathing Heart- Rapid irregular rate and rhythm, no murmurs, rubs or gallops, PMI not laterally displaced GI- soft, NT, ND, + BS Extremities- no clubbing, cyanosis, +  Left arm edematous as well as left leg MS- no significant deformity or atrophy Skin- no rash or lesion Psych- euthymic mood, full affect Neuro- strength and sensation are intact  EKG-afib with RVR at 139 bpm Epic records reviewed Echo TEE-COMPLICATIONS:    There were no immediate complications.  FINDINGS:  LEFT VENTRICLE: EF = <15%. Global hypokinesis.  RIGHT VENTRICLE: Normal size and mildly reduced function.   LEFT ATRIUM: No thrombus/mass. Dilated.  LEFT ATRIAL APPENDAGE: There are two distinct well organized thrombi seen in the LAA.  RIGHT ATRIUM: No thrombus/mass. Chiari network. Dilated.  AORTIC VALVE:  Trileaflet. Mild-moderate regurgitation. No vegetation.  MITRAL VALVE:    Normal structure. Mild regurgitation. No vegetation.  TRICUSPID VALVE: Normal structure. Moderate eccentric regurgitation. No vegetation.  PULMONIC VALVE: Grossly normal structure. Mild-moderate regurgitation. No apparent  vegetation.  INTERATRIAL SEPTUM: No PFO or ASD seen by color Doppler.  PERICARDIUM: No effusion noted.  DESCENDING AORTA: Moderate diffuse plaque seen   CONCLUSION: Severely reduced LVEF. Two separate, well organized thrombi seen in LAA. No cardioversion performed. Given persistent thrombi and afib RVR without ability to cardiovert, contacted both afib clinic and Dr. Lovena Le to alert them to results.   Buford Dresser, MD, PhD [facility]  [address], [place] [address] [place], [place] [address] [phone]   10:06 AM    Assessment and Plan:  1. Persistent afib with RVR Off tikosyn for some time due to suspected noncompliance and prolonged qt Has been off metoprolol for several weeks He plans to go back to  pharmacy today for his refills It appears he has been out of spironolactone, lasix as well as [name]  per pharmacy for several weeks as well  2. HTN Restart above meds  3. CHF Restart  Lasix/spironolactone/metoprolol  Pt states that he takes 1-2 tabs of lasix as needed  Obtain BMET/cbc  with Dr. [name] 5/3  4. LAA thrombi/CHA2DS2VASc score of at least 5 Per pt he is taking pradaxa 150 mg bid  but per Pharmacy no recent refills  RX refilled today   5. Noncompliance I feel pt is totally confused re his meds and has to depend on others to pick up from  pharmacy He needs close supervision in the home to help with compliance/ possibly meds packaged together and mailed to pt  I am not sure how to order this  I spoke to HF CSW and she will look into what type of options may be available to  help with this and home f/u  F/u  with Dr. [name] 5/3  Addendum- 4/28- [name], CSW with HF clinic as been able to arrange Remote Health to see pt in the home, set up similar to [facility] program that serves the HF clinic pt's.   Geroge Baseman Kaitland Lewellyn, [facility] [address] [place], [place]  [address] [phone]

## 2019-06-16 NOTE — Addendum Note (Signed)
Encounter addended by: Enid Derry, CMA on: 06/16/2019 11:03 AM  Actions taken: Order list changed

## 2019-06-17 ENCOUNTER — Telehealth (HOSPITAL_COMMUNITY): Payer: Self-pay | Admitting: Licensed Clinical Social Worker

## 2019-06-17 NOTE — Addendum Note (Signed)
Encounter addended by: Sherran Needs, NP on: 06/17/2019 12:02 PM  Actions taken: Clinical Note Signed

## 2019-06-17 NOTE — Telephone Encounter (Signed)
CSW received referral to provide community resource for patient who is struggling with medication adherence. CSW contacted [name] at [facility] to inquire about the appropriateness of referral. [name] will look into referral and return call to patient for services. [name], [place], [place]

## 2019-06-21 DIAGNOSIS — I5022 Chronic systolic (congestive) heart failure: Secondary | ICD-10-CM | POA: Insufficient documentation

## 2019-06-22 ENCOUNTER — Other Ambulatory Visit: Payer: Self-pay

## 2019-06-22 ENCOUNTER — Encounter (HOSPITAL_COMMUNITY): Payer: Self-pay

## 2019-06-22 ENCOUNTER — Ambulatory Visit (INDEPENDENT_AMBULATORY_CARE_PROVIDER_SITE_OTHER): Payer: Medicare (Managed Care) | Admitting: Internal Medicine

## 2019-06-22 ENCOUNTER — Emergency Department (HOSPITAL_COMMUNITY)
Admission: EM | Admit: 2019-06-22 | Discharge: 2019-06-22 | Disposition: A | Discharge status: Home / Self Care | Payer: Medicare (Managed Care) | Source: Home / Self Care | Attending: Emergency Medicine | Admitting: Emergency Medicine

## 2019-06-22 ENCOUNTER — Telehealth (HOSPITAL_COMMUNITY): Payer: Self-pay | Admitting: Licensed Clinical Social Worker

## 2019-06-22 ENCOUNTER — Encounter: Payer: Self-pay | Admitting: Internal Medicine

## 2019-06-22 VITALS — BP 150/78 | HR 141 | Ht 67.0 in | Wt 172.0 lb

## 2019-06-22 DIAGNOSIS — I4819 Other persistent atrial fibrillation: Secondary | ICD-10-CM

## 2019-06-22 DIAGNOSIS — I5022 Chronic systolic (congestive) heart failure: Secondary | ICD-10-CM | POA: Diagnosis not present

## 2019-06-22 DIAGNOSIS — Z5321 Procedure and treatment not carried out due to patient leaving prior to being seen by health care provider: Secondary | ICD-10-CM

## 2019-06-22 DIAGNOSIS — T85698A Other mechanical complication of other specified internal prosthetic devices, implants and grafts, initial encounter: Secondary | ICD-10-CM | POA: Insufficient documentation

## 2019-06-22 DIAGNOSIS — Y69 Unspecified misadventure during surgical and medical care: Secondary | ICD-10-CM | POA: Insufficient documentation

## 2019-06-22 DIAGNOSIS — K810 Acute cholecystitis: Secondary | ICD-10-CM | POA: Diagnosis not present

## 2019-06-22 DIAGNOSIS — I255 Ischemic cardiomyopathy: Secondary | ICD-10-CM

## 2019-06-22 DIAGNOSIS — I4811 Longstanding persistent atrial fibrillation: Secondary | ICD-10-CM | POA: Diagnosis not present

## 2019-06-22 LAB — COMPREHENSIVE METABOLIC PANEL
ALT: 17 U/L (ref 0–44)
AST: 21 U/L (ref 15–41)
Albumin: 4.2 g/dL (ref 3.5–5.0)
Alkaline Phosphatase: 77 U/L (ref 38–126)
Anion gap: 10 (ref 5–15)
BUN: 16 mg/dL (ref 8–23)
CO2: 22 mmol/L (ref 22–32)
Calcium: 9.5 mg/dL (ref 8.9–10.3)
Chloride: 106 mmol/L (ref 98–111)
Creatinine, Ser: 1.68 mg/dL — ABNORMAL HIGH (ref 0.61–1.24)
GFR calc Af Amer: 48 mL/min — ABNORMAL LOW (ref >60)
GFR calc non Af Amer: 41 mL/min — ABNORMAL LOW (ref >60)
Glucose, Bld: 102 mg/dL — ABNORMAL HIGH (ref 70–99)
Potassium: 4.2 mmol/L (ref 3.5–5.1)
Sodium: 138 mmol/L (ref 135–145)
Total Bilirubin: 1.5 mg/dL — ABNORMAL HIGH (ref 0.3–1.2)
Total Protein: 7.6 g/dL (ref 6.5–8.1)

## 2019-06-22 LAB — CBC
HCT: 36.9 % — ABNORMAL LOW (ref 39.0–52.0)
Hemoglobin: 11.7 g/dL — ABNORMAL LOW (ref 13.0–17.0)
MCH: 30.3 pg (ref 26.0–34.0)
MCHC: 31.7 g/dL (ref 30.0–36.0)
MCV: 95.6 fL (ref 80.0–100.0)
Platelets: 180 10*3/uL (ref 150–400)
RBC: 3.86 MIL/uL — ABNORMAL LOW (ref 4.22–5.81)
RDW: 14 % (ref 11.5–15.5)
WBC: 3.5 10*3/uL — ABNORMAL LOW (ref 4.0–10.5)
nRBC: 0 % (ref 0.0–0.2)

## 2019-06-22 LAB — LIPASE, BLOOD: Lipase: 29 U/L (ref 11–51)

## 2019-06-22 NOTE — Patient Instructions (Signed)
Medication Instructions:   Please make sure you are taking your medications as prescribed.  Your physician recommends that you continue on your current medications as directed. Please refer to the Current Medication list given to you today.  Labwork: None ordered.  Testing/Procedures: None ordered.  Follow-Up: Your physician wants you to follow-up in: 07/21/2019 at 1120am. You will receive a reminder letter in the mail two months in advance. If you don't receive a letter, please call our office to schedule the follow-up appointment.  Remote monitoring is used to monitor your Pacemaker of ICD from home. This monitoring reduces the number of office visits required to check your device to one time per year. It allows [place] to keep an eye on the functioning of your device to ensure it is working properly. .  Any Other Special Instructions Will Be Listed Below (If Applicable).  If you need a refill on your cardiac medications before your next appointment, please call your pharmacy.

## 2019-06-22 NOTE — ED Triage Notes (Signed)
Pt reports his perc chole drain got caught on something last night and his SO cut the drain off. No other complaints

## 2019-06-22 NOTE — Progress Notes (Signed)
Patient Care Team: [name], MD as PCP - General (Family Medicine) [name], MD as PCP - Cardiology (Cardiology)   HPI  Glenn [name] is a 69 y.o. male Seen in follow-up for an ICD.  He has a nonischemic cardiomyopathy.  He has a history of atrial fibrillation and interval stroke.  He was treated with chemical thrombectomy.  He is on Eliquis.    Admitted 9/20 with CHF with A. fib and RVR.  Cardioverted   admitted 9/20 with a large right MCA infarct with hemorrhagic conversion presumed secondary to an embolism and Eliquis was reversed.   10/20 readmitted to acute care with her suspected sepsis.  Blood cultures were essentially negative. 12/20 readmitted with A. fib with rapid rates and congestive heart failure.  Left atrial thrombus noted on TEE patient anticoagulated. The last couple of months have been more of the same.  He has had rapid atrial fibrillation.  Congestive heart failure with peripheral edema shortness of breath nocturnal dyspnea and weakness.  A bunch of work done by [name] and Laroy Apple and [name] to try to help with medication noncompliance.  He says that he was called by the pharmacy today and medications are available.      DATE TEST EF   7/18 Echo 30-35%   12/20 TEE 15-20 %   4/21 TEE <20%      Date Cr K Hgb  8/19 1.22 4.0 12.8         Some interval palpitations.  No chest pain.  No edema.  Minimal shortness of breath.  No bleeding.   Records and Results Reviewed   Past Medical History:  Diagnosis Date  . Acute blood loss anemia   . Acute CVA (cerebrovascular accident) ([place])   . Acute deep vein thrombosis (DVT) of both lower extremities ([name])   . Acute kidney injury ([place])   . Acute on chronic combined systolic and diastolic CHF (congestive heart failure) ([place])   . Acute renal failure superimposed on stage 3a chronic kidney disease ([place])   . Acute respiratory failure with hypoxia ([place])   . Atrial  fibrillation ([place])   . Carpal tunnel syndrome of right wrist 02/28/2018  . Cerebral edema ([place]) 11/13/2018  . Cerebral infarction ([place])   . CHF (congestive heart failure) ([place])   . Cholecystitis 02/04/2019  . Chronic right hip pain   . DCM (dilated cardiomyopathy) ([place])   . Dysphagia, post-stroke   . Elevated troponin   . Entrapment of right ulnar nerve 02/28/2018  . Headache due to intracranial disease 11/14/2018  . Hepatitis C   . History of hemorrhagic stroke with residual hemiparesis ([place]) 02/04/2019  . HTN (hypertension) 08/14/2016  . Hyperlipidemia LDL goal <70 11/13/2018  . Hypertension   . ICD (implantable cardioverter-defibrillator) in place 09/13/2016  . Impotence due to erectile dysfunction 09/30/2017  . Ischemic cardiomyopathy   . Labile blood glucose   . Left leg DVT ([place]) 02/04/2019  . Marijuana user 11/13/2018  . Paroxysmal atrial fibrillation ([name])   . Right middle cerebral artery stroke ([place]) 11/13/2018  . Solitary pulmonary nodule 06/10/2017   5 mm RUL nodule noted incidentally as part of CVA workup 08/2016. With smoking history would obtain low-dose CT scan 08/2017.   . Stroke (cerebrum) ([name]) Lg L MCA infarct w/ hemorrhagic conversion, embolic d/t AF 6/44/0347  . Stroke ([place])   . Trochanteric bursitis, right hip 11/14/2018  . Visit for monitoring Tikosyn therapy 03/26/2017  Past Surgical History:  Procedure Laterality Date  . CARDIAC DEFIBRILLATOR PLACEMENT  2015  . CARDIOVERSION N/A 10/10/2016   Procedure: CARDIOVERSION;  Surgeon: Dorothy Spark, MD;  Location: [facility] [facility];  Service: Cardiovascular;  Laterality: N/A;  . CARDIOVERSION N/A 03/27/2017   Procedure: CARDIOVERSION;  Surgeon: Jerline Pain, MD;  Location: [facility] ENDOSCOPY;  Service: Cardiovascular;  Laterality: N/A;  . CARDIOVERSION N/A 10/29/2018   Procedure: CARDIOVERSION;  Surgeon: Sanda Livana Yerian, MD;  Location: [place] ENDOSCOPY;  Service: Cardiovascular;  Laterality: N/A;  . CARDIOVERSION N/A 11/05/2018    Procedure: CARDIOVERSION;  Surgeon: Acie Fredrickson Wonda Cheng, MD;  Location: [place];  Service: Cardiovascular;  Laterality: N/A;  . EYE SURGERY Left 1990  . IR EXCHANGE BILIARY DRAIN  02/11/2019  . IR EXCHANGE BILIARY DRAIN  06/09/2019  . IR IVC FILTER PLMT / S&I /IMG GUID/MOD SED  12/04/2018  . IR PERC CHOLECYSTOSTOMY  12/13/2018  . IR PERCUTANEOUS ART THROMBECTOMY/INFUSION INTRACRANIAL INC DIAG ANGIO  09/05/2016  . IR RADIOLOGIST EVAL & MGMT  10/03/2016  . RADIOLOGY WITH ANESTHESIA N/A 09/05/2016   Procedure: RADIOLOGY WITH ANESTHESIA;  Surgeon: Luanne Bras, MD;  Location: [place];  Service: Radiology;  Laterality: N/A;  . RIGHT/LEFT HEART CATH AND CORONARY ANGIOGRAPHY N/A 11/03/2018   Procedure: RIGHT/LEFT HEART CATH AND CORONARY ANGIOGRAPHY;  Surgeon: Lorretta Harp, MD;  Location: [facility];  Service: Cardiovascular;  Laterality: N/A;  . TEE WITHOUT CARDIOVERSION N/A 02/06/2019   Procedure: TRANSESOPHAGEAL ECHOCARDIOGRAM (TEE);  Surgeon: Skeet Latch, MD;  Location: [place] [facility];  Service: Cardiovascular;  Laterality: N/A;  . TEE WITHOUT CARDIOVERSION N/A 06/09/2019   Procedure: TRANSESOPHAGEAL ECHOCARDIOGRAM (TEE);  Surgeon: Buford Dresser, MD;  Location[facility] ENDOSCOPY;  Service: Cardiovascular;  Laterality: N/A;    Current Outpatient Medications  Medication Sig Dispense Refill  . acetaminophen (TYLENOL) 325 MG tablet Take 2 tablets (650 mg total) by mouth every 4 (four) hours as needed for mild pain (or temp > 37.5 C (99.5 F)).    Marland Kitchen albuterol (VENTOLIN HFA) 108 (90 Base) MCG/ACT inhaler SMARTSIG:2 Puff(s) By Mouth Every 4-6 Hours PRN    . atorvastatin (LIPITOR) 20 MG tablet TAKE 1 TABLET(20 MG) BY MOUTH DAILY AT 6 PM 90 tablet 0  . calcium carbonate (TUMS - DOSED IN MG ELEMENTAL CALCIUM) 500 MG chewable tablet Chew 1 tablet (200 mg of elemental calcium total) by mouth daily as needed for indigestion or heartburn. 30 tablet 1  . dabigatran ([name]) 150 MG CAPS  capsule Take 1 capsule (150 mg total) by mouth 2 (two) times daily. 180 capsule 0  . digoxin (LANOXIN) 0.125 MG tablet Take 1 tablet (0.125 mg total) by mouth daily. 90 tablet 1  . furosemide (LASIX) 40 MG tablet TAKE 1 TABLET BY MOUTH DAILY WITH ONE EXTRA TABLET AS NEEDED FOR FLUID 180 tablet 2  . gabapentin (NEURONTIN) 600 MG tablet Take 1 tablet (600 mg total) by mouth 3 (three) times daily. 60 tablet 0  . ipratropium (ATROVENT HFA) 17 MCG/ACT inhaler Inhale 2 puffs into the lungs every 6 (six) hours as needed for wheezing. 2 Inhaler 0  . levalbuterol (XOPENEX HFA) 45 MCG/ACT inhaler Inhale 1-2 puffs into the lungs every 4 (four) hours as needed for wheezing. 2 Inhaler 0  . metoprolol succinate (TOPROL-XL) 100 MG 24 hr tablet Take one tablet by mouth daily 90 tablet 0  . pantoprazole (PROTONIX) 40 MG tablet TAKE 1 TABLET(40 MG) BY MOUTH DAILY 90 tablet 0  . potassium chloride SA (KLOR-CON) 20  MEQ tablet TAKE 1 TABLET(20 MEQ) BY MOUTH DAILY 90 tablet 0  . sacubitril-valsartan (ENTRESTO) 24-26 MG Take 1 tablet by mouth 2 (two) times daily. 60 tablet 0  . spironolactone (ALDACTONE) 25 MG tablet Take 1 tablet (25 mg total) by mouth daily. 90 tablet 0  . traMADol (ULTRAM) 50 MG tablet Take 1 tablet (50 mg total) by mouth every 8 (eight) hours as needed for severe pain. 30 tablet 0   No current facility-administered medications for this visit.    Allergies  Allergen Reactions  . Benadryl [Diphenhydramine] Palpitations      Review of Systems negative except from HPI and PMH  Physical Exam BP (!) 150/78   Pulse (!) 141   Ht 5\' 7"  (1.702 m)   Wt 172 lb (78 kg)   SpO2 98%   BMI 26.94 kg/m  Well developed and nourished in no acute distress HENT normal Neck supple with JVP-10 Carotids brisk and full without bruits Clear Irregularly irregular rate and rhythm with  *rapid * ventricular response, no murmurs or gallops Abd-soft with active BS without hepatomegaly No Clubbing cyanosis  edema Skin-warm and dry A & Oriented  Grossly normal sensory and motor function   Assessment and  Plan  Sinus bradycardia  Cardiomyopathy   nonischemic question rate related  Atrial fibrillation-long-term persistent rvr   Congestive heart failure-chronic class 3  Implantable defibrillator-[employer]  Stroke-recent  Hypertension/hypertensive heart disease   The patient has 3 major issues.  The first is medication compliance and a bunch of work has been done about the last couple of weeks to help with this.  The second issue is the atrial fibrillation with rapid rates and the inability to cardiovert because of persistent blood clots.  He is on anticoagulation but again not sure that he is taking it.  He has been given rate control but is not sure that he is taking that either.  I wonder whether given the compliance issues and the very difficult problem of his atrial fibrillation and the clots, he might be better served by AV junction ablation  He is in heart failure.  We will continue his anticoagulation hopefully with slowing of his heart rate  His cholecystostomy bag tube got cut.  I put a bandage on it he was going to the emergency room.

## 2019-06-22 NOTE — ED Notes (Signed)
Call PT for room no answer X1

## 2019-06-22 NOTE — ED Notes (Signed)
Pt called x2 for room. No answer. 

## 2019-06-22 NOTE — ED Provider Notes (Signed)
Patient left without being seen  1. Patient left without being seen       Glenn Etienne, DO 06/22/19 1919

## 2019-06-22 NOTE — Telephone Encounter (Signed)
CSW received referral from remote health to assist with [employer]. CSW reached out via email to [facility] for authorization and confirmed 1 meal a day x 1 month. CSW will complete referral form and reach out to patient to inform. [name], [place], [place]

## 2019-06-23 ENCOUNTER — Inpatient Hospital Stay (HOSPITAL_COMMUNITY)
Admission: EM | Admit: 2019-06-23 | Discharge: 2019-07-16 | DRG: 270 | Disposition: A | Discharge status: Skilled Nursing Facility | Payer: Medicare (Managed Care) | Attending: Internal Medicine | Admitting: Internal Medicine

## 2019-06-23 ENCOUNTER — Encounter (HOSPITAL_COMMUNITY): Payer: Self-pay | Admitting: Emergency Medicine

## 2019-06-23 ENCOUNTER — Emergency Department (HOSPITAL_COMMUNITY): Payer: Medicare (Managed Care)

## 2019-06-23 DIAGNOSIS — Z9119 Patient's noncompliance with other medical treatment and regimen: Secondary | ICD-10-CM

## 2019-06-23 DIAGNOSIS — K810 Acute cholecystitis: Secondary | ICD-10-CM | POA: Diagnosis present

## 2019-06-23 DIAGNOSIS — E785 Hyperlipidemia, unspecified: Secondary | ICD-10-CM | POA: Diagnosis present

## 2019-06-23 DIAGNOSIS — T83010A Breakdown (mechanical) of cystostomy catheter, initial encounter: Secondary | ICD-10-CM | POA: Diagnosis present

## 2019-06-23 DIAGNOSIS — N170 Acute kidney failure with tubular necrosis: Secondary | ICD-10-CM | POA: Diagnosis not present

## 2019-06-23 DIAGNOSIS — I088 Other rheumatic multiple valve diseases: Secondary | ICD-10-CM | POA: Diagnosis present

## 2019-06-23 DIAGNOSIS — G8929 Other chronic pain: Secondary | ICD-10-CM | POA: Diagnosis present

## 2019-06-23 DIAGNOSIS — Z20822 Contact with and (suspected) exposure to covid-19: Secondary | ICD-10-CM | POA: Diagnosis present

## 2019-06-23 DIAGNOSIS — Z9889 Other specified postprocedural states: Secondary | ICD-10-CM

## 2019-06-23 DIAGNOSIS — Z6825 Body mass index (BMI) 25.0-25.9, adult: Secondary | ICD-10-CM

## 2019-06-23 DIAGNOSIS — I69354 Hemiplegia and hemiparesis following cerebral infarction affecting left non-dominant side: Secondary | ICD-10-CM | POA: Diagnosis not present

## 2019-06-23 DIAGNOSIS — E440 Moderate protein-calorie malnutrition: Secondary | ICD-10-CM | POA: Insufficient documentation

## 2019-06-23 DIAGNOSIS — E876 Hypokalemia: Secondary | ICD-10-CM | POA: Diagnosis not present

## 2019-06-23 DIAGNOSIS — B182 Chronic viral hepatitis C: Secondary | ICD-10-CM | POA: Diagnosis present

## 2019-06-23 DIAGNOSIS — R64 Cachexia: Secondary | ICD-10-CM | POA: Diagnosis present

## 2019-06-23 DIAGNOSIS — I130 Hypertensive heart and chronic kidney disease with heart failure and stage 1 through stage 4 chronic kidney disease, or unspecified chronic kidney disease: Secondary | ICD-10-CM | POA: Diagnosis present

## 2019-06-23 DIAGNOSIS — I4819 Other persistent atrial fibrillation: Secondary | ICD-10-CM | POA: Diagnosis not present

## 2019-06-23 DIAGNOSIS — I69391 Dysphagia following cerebral infarction: Secondary | ICD-10-CM

## 2019-06-23 DIAGNOSIS — J449 Chronic obstructive pulmonary disease, unspecified: Secondary | ICD-10-CM | POA: Diagnosis present

## 2019-06-23 DIAGNOSIS — J9601 Acute respiratory failure with hypoxia: Secondary | ICD-10-CM | POA: Diagnosis present

## 2019-06-23 DIAGNOSIS — Z87891 Personal history of nicotine dependence: Secondary | ICD-10-CM

## 2019-06-23 DIAGNOSIS — M25559 Pain in unspecified hip: Secondary | ICD-10-CM | POA: Diagnosis present

## 2019-06-23 DIAGNOSIS — Z79899 Other long term (current) drug therapy: Secondary | ICD-10-CM

## 2019-06-23 DIAGNOSIS — Z95810 Presence of automatic (implantable) cardiac defibrillator: Secondary | ICD-10-CM

## 2019-06-23 DIAGNOSIS — I420 Dilated cardiomyopathy: Secondary | ICD-10-CM | POA: Diagnosis present

## 2019-06-23 DIAGNOSIS — Z86718 Personal history of other venous thrombosis and embolism: Secondary | ICD-10-CM | POA: Diagnosis not present

## 2019-06-23 DIAGNOSIS — I4891 Unspecified atrial fibrillation: Secondary | ICD-10-CM | POA: Diagnosis not present

## 2019-06-23 DIAGNOSIS — Z7189 Other specified counseling: Secondary | ICD-10-CM | POA: Diagnosis not present

## 2019-06-23 DIAGNOSIS — N1831 Chronic kidney disease, stage 3a: Secondary | ICD-10-CM | POA: Diagnosis present

## 2019-06-23 DIAGNOSIS — Z515 Encounter for palliative care: Secondary | ICD-10-CM | POA: Diagnosis not present

## 2019-06-23 DIAGNOSIS — I472 Ventricular tachycardia: Secondary | ICD-10-CM | POA: Diagnosis present

## 2019-06-23 DIAGNOSIS — I5082 Biventricular heart failure: Secondary | ICD-10-CM | POA: Diagnosis present

## 2019-06-23 DIAGNOSIS — N179 Acute kidney failure, unspecified: Secondary | ICD-10-CM

## 2019-06-23 DIAGNOSIS — Y838 Other surgical procedures as the cause of abnormal reaction of the patient, or of later complication, without mention of misadventure at the time of the procedure: Secondary | ICD-10-CM | POA: Diagnosis present

## 2019-06-23 DIAGNOSIS — R570 Cardiogenic shock: Secondary | ICD-10-CM | POA: Diagnosis present

## 2019-06-23 DIAGNOSIS — M79651 Pain in right thigh: Secondary | ICD-10-CM | POA: Diagnosis not present

## 2019-06-23 DIAGNOSIS — I4811 Longstanding persistent atrial fibrillation: Secondary | ICD-10-CM | POA: Diagnosis present

## 2019-06-23 DIAGNOSIS — N171 Acute kidney failure with acute cortical necrosis: Secondary | ICD-10-CM | POA: Diagnosis not present

## 2019-06-23 DIAGNOSIS — Z5189 Encounter for other specified aftercare: Secondary | ICD-10-CM

## 2019-06-23 DIAGNOSIS — I493 Ventricular premature depolarization: Secondary | ICD-10-CM | POA: Diagnosis present

## 2019-06-23 DIAGNOSIS — Z751 Person awaiting admission to adequate facility elsewhere: Secondary | ICD-10-CM

## 2019-06-23 DIAGNOSIS — I5043 Acute on chronic combined systolic (congestive) and diastolic (congestive) heart failure: Secondary | ICD-10-CM | POA: Diagnosis present

## 2019-06-23 DIAGNOSIS — I255 Ischemic cardiomyopathy: Secondary | ICD-10-CM | POA: Diagnosis present

## 2019-06-23 DIAGNOSIS — Z888 Allergy status to other drugs, medicaments and biological substances status: Secondary | ICD-10-CM

## 2019-06-23 DIAGNOSIS — K811 Chronic cholecystitis: Secondary | ICD-10-CM | POA: Diagnosis present

## 2019-06-23 DIAGNOSIS — I361 Nonrheumatic tricuspid (valve) insufficiency: Secondary | ICD-10-CM | POA: Diagnosis not present

## 2019-06-23 DIAGNOSIS — K5900 Constipation, unspecified: Secondary | ICD-10-CM | POA: Diagnosis present

## 2019-06-23 DIAGNOSIS — I509 Heart failure, unspecified: Secondary | ICD-10-CM

## 2019-06-23 DIAGNOSIS — F419 Anxiety disorder, unspecified: Secondary | ICD-10-CM | POA: Diagnosis present

## 2019-06-23 DIAGNOSIS — Z9114 Patient's other noncompliance with medication regimen: Secondary | ICD-10-CM

## 2019-06-23 DIAGNOSIS — K219 Gastro-esophageal reflux disease without esophagitis: Secondary | ICD-10-CM | POA: Diagnosis present

## 2019-06-23 DIAGNOSIS — I5023 Acute on chronic systolic (congestive) heart failure: Secondary | ICD-10-CM | POA: Diagnosis not present

## 2019-06-23 LAB — RESPIRATORY PANEL BY RT PCR (FLU A&B, COVID)
Influenza A by PCR: NEGATIVE
Influenza B by PCR: NEGATIVE
SARS Coronavirus 2 by RT PCR: NEGATIVE

## 2019-06-23 LAB — CBC
HCT: 36.5 % — ABNORMAL LOW (ref 39.0–52.0)
Hemoglobin: 11.4 g/dL — ABNORMAL LOW (ref 13.0–17.0)
MCH: 30.2 pg (ref 26.0–34.0)
MCHC: 31.2 g/dL (ref 30.0–36.0)
MCV: 96.6 fL (ref 80.0–100.0)
Platelets: 179 10*3/uL (ref 150–400)
RBC: 3.78 MIL/uL — ABNORMAL LOW (ref 4.22–5.81)
RDW: 14 % (ref 11.5–15.5)
WBC: 5.2 10*3/uL (ref 4.0–10.5)
nRBC: 0 % (ref 0.0–0.2)

## 2019-06-23 LAB — COMPREHENSIVE METABOLIC PANEL
ALT: 15 U/L (ref 0–44)
AST: 21 U/L (ref 15–41)
Albumin: 3.8 g/dL (ref 3.5–5.0)
Alkaline Phosphatase: 77 U/L (ref 38–126)
Anion gap: 11 (ref 5–15)
BUN: 19 mg/dL (ref 8–23)
CO2: 22 mmol/L (ref 22–32)
Calcium: 9.4 mg/dL (ref 8.9–10.3)
Chloride: 105 mmol/L (ref 98–111)
Creatinine, Ser: 1.75 mg/dL — ABNORMAL HIGH (ref 0.61–1.24)
GFR calc Af Amer: 46 mL/min — ABNORMAL LOW (ref >60)
GFR calc non Af Amer: 39 mL/min — ABNORMAL LOW (ref >60)
Glucose, Bld: 118 mg/dL — ABNORMAL HIGH (ref 70–99)
Potassium: 4.7 mmol/L (ref 3.5–5.1)
Sodium: 138 mmol/L (ref 135–145)
Total Bilirubin: 1.6 mg/dL — ABNORMAL HIGH (ref 0.3–1.2)
Total Protein: 6.9 g/dL (ref 6.5–8.1)

## 2019-06-23 LAB — PROTIME-INR
INR: 2 — ABNORMAL HIGH (ref 0.8–1.2)
Prothrombin Time: 22.3 seconds — ABNORMAL HIGH (ref 11.4–15.2)

## 2019-06-23 LAB — LIPASE, BLOOD: Lipase: 24 U/L (ref 11–51)

## 2019-06-23 LAB — BRAIN NATRIURETIC PEPTIDE: B Natriuretic Peptide: 613.2 pg/mL — ABNORMAL HIGH (ref 0.0–100.0)

## 2019-06-23 MED ORDER — TRAMADOL HCL 50 MG PO TABS
50.0000 mg | ORAL_TABLET | Freq: Three times a day (TID) | ORAL | Status: DC | PRN
  Administered 2019-06-23 – 2019-07-11 (×14): 50 mg via ORAL
  Filled 2019-06-23 (×15): qty 1

## 2019-06-23 MED ORDER — FUROSEMIDE 10 MG/ML IJ SOLN: 20.0000 mg | Freq: Once | INTRAMUSCULAR | Status: DC

## 2019-06-23 MED ORDER — ACETAMINOPHEN 650 MG RE SUPP
650.0000 mg | Freq: Four times a day (QID) | RECTAL | Status: DC | PRN
  Filled 2019-06-23: qty 1

## 2019-06-23 MED ORDER — AMIODARONE HCL IN DEXTROSE 360-4.14 MG/200ML-% IV SOLN
30.0000 mg/h | INTRAVENOUS | Status: DC
  Administered 2019-06-24: 30 mg/h via INTRAVENOUS
  Filled 2019-06-23 (×2): qty 200

## 2019-06-23 MED ORDER — METOPROLOL SUCCINATE ER 25 MG PO TB24: 100.0000 mg | ORAL_TABLET | Freq: Every day | ORAL | Status: DC

## 2019-06-23 MED ORDER — MIDAZOLAM HCL 2 MG/2ML IJ SOLN
2.0000 mg | Freq: Once | INTRAMUSCULAR | Status: AC
  Administered 2019-06-23: 2 mg via INTRAVENOUS
  Filled 2019-06-23: qty 2

## 2019-06-23 MED ORDER — ATORVASTATIN CALCIUM 10 MG PO TABS
20.0000 mg | ORAL_TABLET | Freq: Every day | ORAL | Status: DC
  Administered 2019-06-24 – 2019-07-16 (×22): 20 mg via ORAL
  Filled 2019-06-23 (×23): qty 2

## 2019-06-23 MED ORDER — PANTOPRAZOLE SODIUM 40 MG PO TBEC
40.0000 mg | DELAYED_RELEASE_TABLET | Freq: Every day | ORAL | Status: DC
  Administered 2019-06-24 – 2019-07-16 (×22): 40 mg via ORAL
  Filled 2019-06-23 (×23): qty 1

## 2019-06-23 MED ORDER — DABIGATRAN ETEXILATE MESYLATE 150 MG PO CAPS
150.0000 mg | ORAL_CAPSULE | Freq: Two times a day (BID) | ORAL | Status: DC
  Filled 2019-06-23: qty 1

## 2019-06-23 MED ORDER — METOPROLOL TARTRATE 5 MG/5ML IV SOLN
5.0000 mg | INTRAVENOUS | Status: AC | PRN
  Administered 2019-06-23 (×2): 5 mg via INTRAVENOUS
  Filled 2019-06-23 (×2): qty 5

## 2019-06-23 MED ORDER — AMIODARONE HCL IN DEXTROSE 360-4.14 MG/200ML-% IV SOLN
60.0000 mg/h | INTRAVENOUS | Status: DC
  Administered 2019-06-23 (×2): 60 mg/h via INTRAVENOUS
  Filled 2019-06-23 (×2): qty 200

## 2019-06-23 MED ORDER — ONDANSETRON HCL 4 MG/2ML IJ SOLN
4.0000 mg | Freq: Four times a day (QID) | INTRAMUSCULAR | Status: DC | PRN
  Administered 2019-06-26 – 2019-07-04 (×12): 4 mg via INTRAVENOUS
  Filled 2019-06-23 (×13): qty 2

## 2019-06-23 MED ORDER — ONDANSETRON HCL 4 MG PO TABS: 4.0000 mg | ORAL_TABLET | Freq: Four times a day (QID) | ORAL | Status: DC | PRN

## 2019-06-23 MED ORDER — IPRATROPIUM BROMIDE 0.02 % IN SOLN
2.5000 mL | Freq: Three times a day (TID) | RESPIRATORY_TRACT | Status: DC
  Administered 2019-06-24: 0.5 mg via RESPIRATORY_TRACT
  Filled 2019-06-23: qty 2.5

## 2019-06-23 MED ORDER — ALBUTEROL SULFATE (2.5 MG/3ML) 0.083% IN NEBU: 2.5000 mg | INHALATION_SOLUTION | RESPIRATORY_TRACT | Status: DC

## 2019-06-23 MED ORDER — ACETAMINOPHEN 325 MG PO TABS
650.0000 mg | ORAL_TABLET | Freq: Four times a day (QID) | ORAL | Status: DC | PRN
  Administered 2019-06-24 – 2019-07-08 (×9): 650 mg via ORAL
  Filled 2019-06-23 (×11): qty 2

## 2019-06-23 MED ORDER — CALCIUM CARBONATE ANTACID 500 MG PO CHEW
1.0000 | CHEWABLE_TABLET | Freq: Every day | ORAL | Status: DC | PRN
  Administered 2019-07-02 – 2019-07-05 (×5): 200 mg via ORAL
  Filled 2019-06-23 (×5): qty 1

## 2019-06-23 MED ORDER — IPRATROPIUM BROMIDE 0.02 % IN SOLN
2.5000 mL | Freq: Four times a day (QID) | RESPIRATORY_TRACT | Status: DC
  Administered 2019-06-23: 0.5 mg via RESPIRATORY_TRACT
  Filled 2019-06-23: qty 2.5

## 2019-06-23 MED ORDER — AMIODARONE LOAD VIA INFUSION
150.0000 mg | Freq: Once | INTRAVENOUS | Status: AC
  Administered 2019-06-23: 150 mg via INTRAVENOUS
  Filled 2019-06-23: qty 83.34

## 2019-06-23 MED ORDER — DIAZEPAM 5 MG PO TABS
10.0000 mg | ORAL_TABLET | Freq: Once | ORAL | Status: AC
  Administered 2019-06-23: 10 mg via ORAL
  Filled 2019-06-23: qty 2

## 2019-06-23 MED ORDER — DILTIAZEM HCL-DEXTROSE 125-5 MG/125ML-% IV SOLN (PREMIX)
5.0000 mg/h | INTRAVENOUS | Status: DC
  Administered 2019-06-23: 5 mg/h via INTRAVENOUS
  Filled 2019-06-23: qty 125

## 2019-06-23 MED ORDER — SODIUM CHLORIDE 0.9% FLUSH: 3.0000 mL | Freq: Once | INTRAVENOUS | Status: DC

## 2019-06-23 NOTE — ED Triage Notes (Signed)
Pt reports his drain for his gallbaldder got caught on something yesterday and the bag was removed. States he came in to be seen after but the wait was long and he was never seen by a provider.

## 2019-06-23 NOTE — Progress Notes (Signed)
[facility] attempted to fax several times, but fax unable to go through. . Patient is to be admitted. Recommend o get first discharged meds from [facility].pharmacy.

## 2019-06-23 NOTE — Progress Notes (Signed)
I saw this patient before 6 PM today and discuss the care plan with the resident. I will cosign their H&P once it is done.  I have asked the resident to consult Cards tonight to see the patient.  Per Dr. Jeannine Kitten, cardiology is aware of consultation.

## 2019-06-23 NOTE — Consult Note (Signed)
Chief Complaint: Cholecystomy tube severed  Referring Physician(s): Dr. Kathrynn Humble  Supervising Physician: Daryll Brod  Patient Status: [facility] - In-pt  History of Present Illness: Glenn [name] is a 69 y.o. male History of  A fib (on pradaxa) acute calculus cholecystitis s/p chole tube placement on 10.24.20 last exchanged on 4.20.21. Patient  Presented to the ED with [facility]. During evaluation patient cholecystomy tube was found to be cut. Patient states that his significant other broke it 2 nights ago. Team is requesting cholecystomy tube exchange   Past Medical History:  Diagnosis Date  . Acute blood loss anemia   . Acute CVA (cerebrovascular accident) ([place])   . Acute deep vein thrombosis (DVT) of both lower extremities ([name])   . Acute kidney injury ([place])   . Acute on chronic combined systolic and diastolic CHF (congestive heart failure) ([place])   . Acute renal failure superimposed on stage 3a chronic kidney disease ([place])   . Acute respiratory failure with hypoxia ([place])   . Atrial fibrillation ([place])   . Carpal tunnel syndrome of right wrist 02/28/2018  . Cerebral edema ([employer]) 11/13/2018  . Cerebral infarction ([place])   . CHF (congestive heart failure) ([place])   . Cholecystitis 02/04/2019  . Chronic right hip pain   . DCM (dilated cardiomyopathy) ([place])   . Dysphagia, post-stroke   . Elevated troponin   . Entrapment of right ulnar nerve 02/28/2018  . Headache due to intracranial disease 11/14/2018  . Hepatitis C   . History of hemorrhagic stroke with residual hemiparesis ([place]) 02/04/2019  . HTN (hypertension) 08/14/2016  . Hyperlipidemia LDL goal <70 11/13/2018  . Hypertension   . ICD (implantable cardioverter-defibrillator) in place 09/13/2016  . Impotence due to erectile dysfunction 09/30/2017  . Ischemic cardiomyopathy   . Labile blood glucose   . Left leg DVT ([place]) 02/04/2019  . Marijuana user 11/13/2018  . Paroxysmal atrial fibrillation ([name])   . Right middle cerebral  artery stroke ([place]) 11/13/2018  . Solitary pulmonary nodule 06/10/2017   5 mm RUL nodule noted incidentally as part of CVA workup 08/2016. With smoking history would obtain low-dose CT scan 08/2017.   . Stroke (cerebrum) ([name]) Lg L MCA infarct w/ hemorrhagic conversion, embolic d/t AF 0/53/9767  . Stroke ([place])   . Trochanteric bursitis, right hip 11/14/2018  . Visit for monitoring Tikosyn therapy 03/26/2017    Past Surgical History:  Procedure Laterality Date  . CARDIAC DEFIBRILLATOR PLACEMENT  2015  . CARDIOVERSION N/A 10/10/2016   Procedure: CARDIOVERSION;  Surgeon: Dorothy Spark, MD;  Location: [place];  Service: Cardiovascular;  Laterality: N/A;  . CARDIOVERSION N/A 03/27/2017   Procedure: CARDIOVERSION;  Surgeon: Jerline Pain, MD;  Location: [facility] ENDOSCOPY;  Service: Cardiovascular;  Laterality: N/A;  . CARDIOVERSION N/A 10/29/2018   Procedure: CARDIOVERSION;  Surgeon: Sanda Klein, MD;  Location: [place] ENDOSCOPY;  Service: Cardiovascular;  Laterality: N/A;  . CARDIOVERSION N/A 11/05/2018   Procedure: CARDIOVERSION;  Surgeon: Acie Fredrickson Wonda Cheng, MD;  Location: [place];  Service: Cardiovascular;  Laterality: N/A;  . EYE SURGERY Left 1990  . IR EXCHANGE BILIARY DRAIN  02/11/2019  . IR EXCHANGE BILIARY DRAIN  06/09/2019  . IR IVC FILTER PLMT / S&I /IMG GUID/MOD SED  12/04/2018  . IR PERC CHOLECYSTOSTOMY  12/13/2018  . IR PERCUTANEOUS ART THROMBECTOMY/INFUSION INTRACRANIAL INC DIAG ANGIO  09/05/2016  . IR RADIOLOGIST EVAL & MGMT  10/03/2016  . RADIOLOGY WITH ANESTHESIA N/A 09/05/2016   Procedure: RADIOLOGY WITH ANESTHESIA;  Surgeon: Luanne Bras,  MD;  Location: [place];  Service: Radiology;  Laterality: N/A;  . RIGHT/LEFT HEART CATH AND CORONARY ANGIOGRAPHY N/A 11/03/2018   Procedure: RIGHT/LEFT HEART CATH AND CORONARY ANGIOGRAPHY;  Surgeon: Lorretta Harp, MD;  Location: [place] CV LAB;  Service: Cardiovascular;  Laterality: N/A;  . TEE WITHOUT CARDIOVERSION N/A 02/06/2019    Procedure: TRANSESOPHAGEAL ECHOCARDIOGRAM (TEE);  Surgeon: Skeet Latch, MD;  Location: [place];  Service: Cardiovascular;  Laterality: N/A;  . TEE WITHOUT CARDIOVERSION N/A 06/09/2019   Procedure: TRANSESOPHAGEAL ECHOCARDIOGRAM (TEE);  Surgeon: Buford Dresser, MD;  Location: [facility] ENDOSCOPY;  Service: Cardiovascular;  Laterality: N/A;    Allergies: Benadryl [diphenhydramine]  Medications: Prior to Admission medications   Medication Sig Start Date End Date Taking? Authorizing Provider  acetaminophen (TYLENOL) 325 MG tablet Take 2 tablets (650 mg total) by mouth every 4 (four) hours as needed for mild pain (or temp > 37.5 C (99.5 F)). 12/02/18   Angiulli, Lavon Paganini, PA-C  albuterol (VENTOLIN HFA) 108 (90 Base) MCG/ACT inhaler SMARTSIG:2 Puff(s) By Mouth Every 4-6 Hours PRN 06/12/19   [provider]  atorvastatin (LIPITOR) 20 MG tablet TAKE 1 TABLET(20 MG) BY MOUTH DAILY AT 6 PM 03/13/19   [name], MD  calcium carbonate (TUMS - DOSED IN MG ELEMENTAL CALCIUM) 500 MG chewable tablet Chew 1 tablet (200 mg of elemental calcium total) by mouth daily as needed for indigestion or heartburn. 03/12/19   [name], MD  dabigatran ([name]) 150 MG CAPS capsule Take 1 capsule (150 mg total) by mouth 2 (two) times daily. 06/16/19   Sherran Needs, NP  digoxin (LANOXIN) 0.125 MG tablet Take 1 tablet (0.125 mg total) by mouth daily. 05/12/19   Baldwin Jamaica, PA-C  furosemide (LASIX) 40 MG tablet TAKE 1 TABLET BY MOUTH DAILY WITH ONE EXTRA TABLET AS NEEDED FOR FLUID 06/16/19   Sherran Needs, NP  gabapentin (NEURONTIN) 600 MG tablet Take 1 tablet (600 mg total) by mouth 3 (three) times daily. 03/25/19   [name], MD  ipratropium (ATROVENT HFA) 17 MCG/ACT inhaler Inhale 2 puffs into the lungs every 6 (six) hours as needed for wheezing. 03/16/19   [name], MD  levalbuterol [facility] HFA) 45 MCG/ACT inhaler Inhale 1-2 puffs into the lungs every 4 (four) hours as  needed for wheezing. 03/13/19   [name], MD  metoprolol succinate (TOPROL-XL) 100 MG 24 hr tablet Take one tablet by mouth daily 06/16/19   Sherran Needs, NP  pantoprazole (PROTONIX) 40 MG tablet TAKE 1 TABLET(40 MG) BY MOUTH DAILY 03/13/19   [name], MD  potassium chloride SA (KLOR-CON) 20 MEQ tablet TAKE 1 TABLET(20 MEQ) BY MOUTH DAILY 04/20/19   [name], MD  sacubitril-valsartan (ENTRESTO) 24-26 MG Take 1 tablet by mouth 2 (two) times daily. 03/12/19   [name], MD  spironolactone (ALDACTONE) 25 MG tablet Take 1 tablet (25 mg total) by mouth daily. 06/16/19   Sherran Needs, NP  traMADol (ULTRAM) 50 MG tablet Take 1 tablet (50 mg total) by mouth every 8 (eight) hours as needed for severe pain. 03/12/19   [name], MD     Family History  Problem Relation Age of Onset  . High blood pressure Mother   . High blood pressure Father   . Stroke Maternal Aunt   . Heart disease Neg Hx     Social History   Socioeconomic History  . Marital status: Single    Spouse name: Not on file  . Number of  children: Not on file  . Years of education: 71 (some college)  . Highest education level: Not on file  Occupational History  . Occupation: disability  Tobacco Use  . Smoking status: Former Smoker    Packs/day: 0.50    Types: Cigarettes  . Smokeless tobacco: Never Used  . Tobacco comment: a pack last three days  Substance and Sexual Activity  . Alcohol use: Not Currently    Alcohol/week: 3.0 standard drinks    Types: 3 Cans of beer per week    Comment: pt stop drinking   . Drug use: Not Currently    Frequency: 2.0 times per week    Types: Marijuana    Comment: stop smoking   . Sexual activity: Yes    Partners: Female    Birth control/protection: Condom  Other Topics Concern  . Not on file  Social History Narrative  . Not on file   Social Determinants of Health   Financial Resource Strain:   . Difficulty of Paying Living Expenses:   Food  Insecurity:   . Worried About Charity fundraiser in the Last Year:   . Arboriculturist in the Last Year:   Transportation Needs:   . Film/video editor (Medical):   Marland Kitchen Lack of Transportation (Non-Medical):   Physical Activity:   . Days of Exercise per Week:   . Minutes of Exercise per Session:   Stress:   . Feeling of Stress :   Social Connections:   . Frequency of Communication with Friends and Family:   . Frequency of Social Gatherings with Friends and Family:   . Attends Religious Services:   . Active Member of Clubs or Organizations:   . Attends Archivist Meetings:   Marland Kitchen Marital Status:     Review of Systems: A 12 point ROS discussed and pertinent positives are indicated in the HPI above.  All other systems are negative.  Review of Systems  Constitutional: Negative for fever.  HENT: Negative for congestion.   Respiratory: Negative for cough and shortness of breath.   Cardiovascular: Negative for chest pain.  Gastrointestinal: Negative for abdominal pain.  Musculoskeletal: Positive for myalgias ( generalized).  Neurological: Negative for headaches.  Psychiatric/Behavioral: Negative for behavioral problems and confusion.    Vital Signs: BP (!) 108/93   Pulse 94   Temp 98.3 F (36.8 C) (Oral)   Resp (!) 36   Ht 5\' 7"  (1.702 m)   Wt 175 lb (79.4 kg)   SpO2 100%   BMI 27.41 kg/m   Physical Exam Vitals and nursing note reviewed.  Constitutional:      Appearance: He is well-developed.  HENT:     Head: Normocephalic.  Cardiovascular:     Rate and Rhythm: Tachycardia present.     Heart sounds: Normal heart sounds.  Pulmonary:     Effort: Pulmonary effort is normal.  Abdominal:     Comments: Chole tube severed suture intact. Brown output noted.   Musculoskeletal:        General: Normal range of motion.     Cervical back: Normal range of motion.  Skin:    General: Skin is dry.  Neurological:     Mental Status: He is alert and oriented to person,  place, and time.     Imaging: DG Chest Port 1 View  Result Date: 06/23/2019 CLINICAL DATA:  Acute shortness of breath EXAM: PORTABLE CHEST 1 VIEW COMPARISON:  06/23/2019 FINDINGS: Single frontal view of the  chest demonstrates interval placement of external defibrillator pads. Single lead AICD unchanged. Cardiac silhouette remains enlarged. There is continued vascular congestion with mild interstitial edema. No airspace disease, effusion, or pneumothorax. There is chronic right basilar scarring/atelectasis versus pleural thickening. No acute bony abnormalities. IMPRESSION: 1. Stable vascular congestion and interstitial edema. Electronically Signed   By: Randa Ngo M.D.   On: 06/23/2019 15:14   DG Chest Portable 1 View  Result Date: 06/23/2019 CLINICAL DATA:  Tachycardia.  Shortness of breath. EXAM: PORTABLE CHEST 1 VIEW COMPARISON:  06/01/2019 FINDINGS: 1145 hours. The cardio pericardial silhouette is enlarged. Leftward patient rotation. Similar appearance vascular congestion with possible interstitial edema. Right base atelectasis without substantial pleural effusion. Left pacer/AICD again noted. Telemetry leads overlie the chest. IMPRESSION: Cardiomegaly with vascular congestion and potential component of interstitial edema. Electronically Signed   By: Misty Stanley M.D.   On: 06/23/2019 11:57   DG Chest Portable 1 View  Result Date: 06/01/2019 CLINICAL DATA:  Chest pain and shortness of breath. EXAM: PORTABLE CHEST 1 VIEW COMPARISON:  Chest x-ray dated February 03, 2019. FINDINGS: Unchanged left chest wall pacemaker. Stable cardiomegaly. Pulmonary vascular congestion with mild peripheral interstitial thickening at the right lung base. Improved aeration at the lung bases. No focal consolidation, pleural effusion, or pneumothorax. No acute osseous abnormality. IMPRESSION: Mild interstitial pulmonary edema. Electronically Signed   By: Titus Dubin M.D.   On: 06/01/2019 15:16   ECHO TEE  Result  Date: 06/09/2019    TRANSESOPHOGEAL ECHO REPORT   Patient Name:   ARMONIE STATEN Date of Exam: 06/09/2019 Medical Rec #:  [id number]           Height:       67.0 in Accession #:    [id number]          Weight:       174.6 lb Date of Birth:  December 11, 1950           BSA:          1.909 m Patient Age:    40 years            BP:           132/80 mmHg Patient Gender: M                   HR:           168 bpm. Exam Location:  Inpatient Procedure: Transesophageal Echo, Cardiac Doppler and Color Doppler Indications:     I48.91* Unspeicified atrial fibrillation  History:         Patient has prior history of Echocardiogram examinations, most                  recent 02/06/2019. Cardiomyopathy, Abnormal ECG and                  Defibrillator, Stroke, Arrythmias:Atrial Fibrillation,                  Signs/Symptoms:Chest Pain; Risk Factors:Hypertension and                  Current Smoker. Left atrial thrombus.  Sonographer:     Roseanna Rainbow RDCS Referring Phys:  [id number] [name] Diagnosing Phys: Buford Dresser MD PROCEDURE: After discussion of the risks and benefits of a TEE, an informed consent was obtained from the patient. The transesophogeal probe was passed without difficulty through the esophogus of the patient. Imaged were obtained with the patient in a  left lateral decubitus position. Local oropharyngeal anesthetic was provided with Cetacaine. Sedation performed by different physician. The patient was monitored while under deep sedation. Anesthestetic sedation was provided intravenously by Anesthesiology: 60mg  of Propofol. Image quality was excellent. The patient's vital signs; including heart rate, blood pressure, and oxygen saturation; remained stable throughout the procedure. The patient developed no complications during the procedure. IMPRESSIONS  1. Left ventricular ejection fraction, by estimation, is <20%. The left ventricle has severely decreased function. The left ventricle demonstrates global  hypokinesis. The left ventricular internal cavity size was mildly dilated.  2. Right ventricular systolic function is mildly reduced. The right ventricular size is normal.  3. Left atrial size was dilated. A left atrial/left atrial appendage thrombus was detected.  4. Right atrial size was dilated.  5. The mitral valve is normal in structure. Mild mitral valve regurgitation. No evidence of mitral stenosis.  6. Tricuspid valve regurgitation is moderate.  7. The aortic valve is tricuspid. Aortic valve regurgitation is mild to moderate. No aortic stenosis is present.  8. There is Moderate (Grade III) plaque involving the descending aorta. Conclusion(s)/Recommendation(s): Severely reduced LVEF. Two separate, well organized thrombi seen in LAA. No cardioversion performed. Given persistent thrombi and afib RVR without ability to cardiovert, contacted both afib clinic and Dr. Lovena Le to alert them to results. FINDINGS  Left Ventricle: Left ventricular ejection fraction, by estimation, is <20%. The left ventricle has severely decreased function. The left ventricle demonstrates global hypokinesis. The left ventricular internal cavity size was mildly dilated. There is no  left ventricular hypertrophy. Right Ventricle: The right ventricular size is normal. No increase in right ventricular wall thickness. Right ventricular systolic function is mildly reduced. Left Atrium: Left atrial size was dilated. A left atrial/left atrial appendage thrombus was detected. Right Atrium: Right atrial size was dilated. Pericardium: There is no evidence of pericardial effusion. Mitral Valve: The mitral valve is normal in structure. Mild mitral valve regurgitation. No evidence of mitral valve stenosis. There is no evidence of mitral valve vegetation. Tricuspid Valve: The tricuspid valve is normal in structure. Tricuspid valve regurgitation is moderate. There is no evidence of tricuspid valve vegetation. Aortic Valve: The aortic valve is  tricuspid. Aortic valve regurgitation is mild to moderate. No aortic stenosis is present. There is no evidence of aortic valve vegetation. Pulmonic Valve: The pulmonic valve was grossly normal. Pulmonic valve regurgitation is mild to moderate. There is no evidence of pulmonic valve vegetation. Aorta: The aortic root and ascending aorta are structurally normal, with no evidence of dilitation. There is moderate (Grade III) plaque involving the descending aorta. IAS/Shunts: No atrial level shunt detected by color flow Doppler. Buford Dresser MD Electronically signed by Buford Dresser MD Signature Date/Time: 06/09/2019/11:30:10 AM    Final    CUP PACEART INCLINIC DEVICE CHECK  Result Date: 06/03/2019 ICD check in clinic. Normal device function. Thresholds and sensing consistent with previous device measurements. Impedance trends stable over time. No atrial data. Pt remains in AF with RVR by EKG. HRs> 100 mostly in setting of poor compliance.  No changes made this session. Device programmed at appropriate safety margins. Estimated longevity 1.5 yrs. Pt enrolled in remote follow-up. Patient education completed including shock plan. Plan TEE/DCCV next week.  CUP PACEART REMOTE DEVICE CHECK  Result Date: 06/03/2019 Scheduled remote reviewed. Normal device function.  Estimated 1.5 years remaining until ERI. 309 NonSustV, 2 VT-No therapy episodes (max. ventricular rate 208 bpm); available EGMs appear AF/RVR. Known AF, takes pradaxa and digoxin. Next remote  91 days. Felisa Bonier, RN, MSNMRI Protection last programmed on Nov 12, 2018. Murlean Hark is disabled.  IR EXCHANGE BILIARY DRAIN  Result Date: 06/09/2019 INDICATION: Previous acute calculus cholecystitis, post cholecystostomy catheter placement 12/13/2018, exchanged 02/11/2019. Catheter externally appears to have pulled back. Exchange requested. EXAM: CHOLECYSTOSTOMY CATHETER EXCHANGE UNDER FLUOROSCOPY MEDICATIONS: None indicated ANESTHESIA/SEDATION:  Lidocaine 1% subcutaneous FLUOROSCOPY TIME:  1 minutes; 62  uGym2 DAP COMPLICATIONS: None immediate. PROCEDURE: Informed written consent was obtained from the patient after a thorough discussion of the procedural risks, benefits and alternatives. All questions were addressed. Maximal Sterile Barrier Technique was utilized including caps, mask, sterile gowns, sterile gloves, sterile drape, hand hygiene and skin antiseptic. A timeout was performed prior to the initiation of the procedure. 1% lidocaine was infiltrated around the catheter entry site. Small contrast injection demonstrates good position of the catheter within the nondistended gallbladder. No filling of the cystic duct identified. Catheter cut and exchanged for a new 10 French pigtail drain, placed centrally within the gallbladder. Contrast injection confirms good positioning. No contrast passage into the common duct. IMPRESSION: 1. Technically successful exchange of cholecystostomy catheter under fluoroscopy. Electronically Signed   By: Lucrezia Europe M.D.   On: 06/09/2019 17:07    Labs:  CBC: Recent Labs    06/01/19 1413 06/03/19 1038 06/22/19 1555 06/23/19 1235  WBC 4.3 4.4 3.5* 5.2  HGB 11.3* 11.2* 11.7* 11.4*  HCT 37.0* 33.7* 36.9* 36.5*  PLT 153 166 180 179    COAGS: Recent Labs    11/01/18 1814 11/03/18 0604 11/10/18 1753 11/10/18 2125 12/02/18 2018 12/02/18 2018 12/04/18 0333 02/06/19 0443 06/01/19 1413 06/23/19 1235  INR  --   --  1.2   < > 1.4*   < > 1.4* 1.6* 1.3* 2.0*  APTT 35 51* 31  --  31  --   --   --   --   --    < > = values in this interval not displayed.    BMP: Recent Labs    06/01/19 1413 06/03/19 1038 06/22/19 1555 06/23/19 1235  NA 139 142 138 138  K 3.9 3.9 4.2 4.7  CL 108 103 106 105  CO2 23 24 22 22   GLUCOSE 122* 108* 102* 118*  BUN 17 17 16 19   CALCIUM 9.0 9.6 9.5 9.4  CREATININE 1.36* 1.51* 1.68* 1.75*  GFRNONAA 53* 47* 41* 39*  GFRAA >60 54* 48* 46*    LIVER FUNCTION  TESTS: Recent Labs    02/17/19 1447 06/01/19 1413 06/22/19 1555 06/23/19 1235  BILITOT 0.7 0.9 1.5* 1.6*  AST 26 20 21 21   ALT 16 12 17 15   ALKPHOS 69 74 77 77  PROT 7.5 6.9 7.6 6.9  ALBUMIN 3.8 4.0 4.2 3.8    TUMOR MARKERS: No results for input(s): AFPTM, CEA, CA199, CHROMGRNA in the last 8760 hours.  Assessment and Plan: 69 y.o, male inpatient. History of  A fib (on pradaxa) acute calculus cholecystitis s/p chole tube placement on 10.24.20 last exchanged on 4.20.21. Patient  Presented to the ED with [facility]. During evaluation patient cholecystomy tube was found to be cut. Patient states that his significant other broke it 2 nights ago. Team is requesting cholecystomy tube exchange.  Pertinent Imaging None since last exchange  Pertinent IR History 4.20.21 - Exchange cholecystomy tube pulled back  12.23.20 - Exchange cholecystomy tube  10.24.20 - Cholecystomy tube placement  Pertinent Allergies benadryl  COVID pending. Cr 1.75 All labs and medications are within acceptable parameters.  Patient  is afebrile.  IR consulted for possible cholecystomy tube replacement. Case has been reviewed and procedure approved by Dr. Annamaria Boots.  Patient tentatively scheduled for 5.5.21. In the interim request ED RN to place ostomy bag over the existing tube exit site. .  Team instructed to: Keep Patient to be NPO after midnight Hold prophylactic anticoagulation 24 hours prior to scheduled procedure.   IR will call patient when ready.    Risks and benefits discussed with the patient including, but not limited to bleeding, infection, gallbladder perforation, bile leak, sepsis or even death.  All of the patient's questions were answered, patient is agreeable to proceed.  Consent signed and in IR control room    Thank you for this interesting consult.  I greatly enjoyed meeting Ashley Montminy Carte and look forward to participating in their care.  A copy of this report was sent to the  requesting provider on this date.  Electronically Signed: Avel Peace, NP 06/23/2019, 4:14 PM   I spent a total of 40 Minutes    in face to face in clinical consultation, greater than 50% of which was counseling/coordinating care for Cholecystomy tube replacement

## 2019-06-23 NOTE — ED Notes (Signed)
Dinner Tray Ordered @ [phone].

## 2019-06-23 NOTE — Progress Notes (Addendum)
Spoke to patient about medications. He stated his son , whom lives with him went to go get them, but the [facility] on [address] closes at 6p, and he was too late. Called [facility] and asked them to fax [place] paperwork to show three months worth of  Patients medication fills.  He states this is the only pharmacy that he uses. Suggested to patient that he perhaps move medications to [facility] on [address] that is open 24/7 to avoid this problem.  Consult in for care management to continue to follow if admitted  Currently on 100% O2.

## 2019-06-23 NOTE — ED Notes (Signed)
Pt agitates and hyperventilation and pulling a cloths.  02 sats down to 89 , Resp rate 53 . Pt started on 100% re breather mask and EDP notified of Pt change.

## 2019-06-23 NOTE — ED Provider Notes (Signed)
[facility] EMERGENCY DEPARTMENT Provider Note   CSN: [id number] Arrival date & time: 06/23/19  1056     History Chief Complaint  Patient presents with  . Post-op Problem    Glenn [name] is a 69 y.o. male.  HPI     69 year old with history of CHF, A. fib on [name], stroke, COPD and chronic cholecystitis with biliary drain that was just exchanged on April 20 comes in with chief complaint of catheter problems.  Patient reports that his catheter got caught onto something yesterday and the back came off. The suture is also loose. He came to the ER immediately, but after waiting 4 hours left for his home. He continues to have some discomfort around the insertion site.  Additionally patient is noted to be in A. fib with RVR. He reports that he is having palpitations and shortness of breath with exertion. He has been in and out of A. fib and has a pacemaker in place.  Patient denies any new cough, fevers, chills, body aches, sick exposures.   Past Medical History:  Diagnosis Date  . Acute blood loss anemia   . Acute CVA (cerebrovascular accident) ([place])   . Acute deep vein thrombosis (DVT) of both lower extremities ([name])   . Acute kidney injury ([place])   . Acute on chronic combined systolic and diastolic CHF (congestive heart failure) ([place])   . Acute renal failure superimposed on stage 3a chronic kidney disease ([place])   . Acute respiratory failure with hypoxia ([place])   . Atrial fibrillation ([place])   . Carpal tunnel syndrome of right wrist 02/28/2018  . Cerebral edema ([place]) 11/13/2018  . Cerebral infarction ([place])   . CHF (congestive heart failure) ([place])   . Cholecystitis 02/04/2019  . Chronic right hip pain   . DCM (dilated cardiomyopathy) ([place])   . Dysphagia, post-stroke   . Elevated troponin   . Entrapment of right ulnar nerve 02/28/2018  . Headache due to intracranial disease 11/14/2018  . Hepatitis C   . History of hemorrhagic stroke with residual  hemiparesis ([place]) 02/04/2019  . HTN (hypertension) 08/14/2016  . Hyperlipidemia LDL goal <70 11/13/2018  . Hypertension   . ICD (implantable cardioverter-defibrillator) in place 09/13/2016  . Impotence due to erectile dysfunction 09/30/2017  . Ischemic cardiomyopathy   . Labile blood glucose   . Left leg DVT ([place]) 02/04/2019  . Marijuana user 11/13/2018  . Paroxysmal atrial fibrillation ([name])   . Right middle cerebral artery stroke ([place]) 11/13/2018  . Solitary pulmonary nodule 06/10/2017   5 mm RUL nodule noted incidentally as part of CVA workup 08/2016. With smoking history would obtain low-dose CT scan 08/2017.   . Stroke (cerebrum) ([name]) Lg L MCA infarct w/ hemorrhagic conversion, embolic d/t AF 3/76/2831  . Stroke ([place])   . Trochanteric bursitis, right hip 11/14/2018  . Visit for monitoring Tikosyn therapy 03/26/2017    Patient Active Problem List   Diagnosis Date Noted  . Chronic systolic heart failure ([place]) 06/21/2019  . Persistent atrial fibrillation with RVR ([place])   . Thrombus of left atrial appendage   . Left leg pain 03/27/2019  . History of hemorrhagic stroke with residual hemiparesis ([place]) 02/04/2019  . Left leg DVT ([place]) 02/04/2019  . Cholecystitis 02/04/2019  . Acute deep vein thrombosis (DVT) of both lower extremities ([name])   . Acute blood loss anemia   . Labile blood glucose   . AKI (acute kidney injury) ([place])   . Chronic right hip pain   .  Acute renal failure superimposed on stage 3a chronic kidney disease ([place])   . Headache due to intracranial disease 11/14/2018  . Trochanteric bursitis, right hip 11/14/2018  . Cerebral edema ([place]) 11/13/2018  . Hyperlipidemia LDL goal <70 11/13/2018  . Marijuana user 11/13/2018  . Right middle cerebral artery stroke ([place]) 11/13/2018  . Acute CVA (cerebrovascular accident) ([place])   . Noncompliance   . Dysphagia, post-stroke   . Acute on chronic combined systolic and diastolic CHF (congestive heart failure) ([place])   . DCM (dilated  cardiomyopathy) ([place])   . Atrial fibrillation ([place]) 11/04/2018  . Elevated troponin   . Acute respiratory failure with hypoxia ([place])   . Acute kidney injury ([place])   . Entrapment of right ulnar nerve 02/28/2018  . Carpal tunnel syndrome of right wrist 02/28/2018  . Impotence due to erectile dysfunction 09/30/2017  . Solitary pulmonary nodule 06/10/2017  . Neck pain 04/06/2017  . Paroxysmal atrial fibrillation ([name])   . ICD (implantable cardioverter-defibrillator) in place 09/13/2016  . Chest pain 09/13/2016  . Tobacco abuse 09/13/2016  . Hospital discharge follow-up 09/13/2016  . Upper back pain 09/13/2016  . Housing problems 09/13/2016  . Ischemic cardiomyopathy   . Cerebral infarction ([place])   . Stroke (cerebrum) ([name]) Lg L MCA infarct w/ hemorrhagic conversion, embolic d/t AF 19/37/9024  . HTN (hypertension) 08/14/2016  . Chronic Hepatitis C  08/14/2016    Past Surgical History:  Procedure Laterality Date  . CARDIAC DEFIBRILLATOR PLACEMENT  2015  . CARDIOVERSION N/A 10/10/2016   Procedure: CARDIOVERSION;  Surgeon: Dorothy Spark, MD;  Location: [facility];  Service: Cardiovascular;  Laterality: N/A;  . CARDIOVERSION N/A 03/27/2017   Procedure: CARDIOVERSION;  Surgeon: Jerline Pain, MD;  Location: [facility] ENDOSCOPY;  Service: Cardiovascular;  Laterality: N/A;  . CARDIOVERSION N/A 10/29/2018   Procedure: CARDIOVERSION;  Surgeon: Sanda Klein, MD;  Location: [facility] ENDOSCOPY;  Service: Cardiovascular;  Laterality: N/A;  . CARDIOVERSION N/A 11/05/2018   Procedure: CARDIOVERSION;  Surgeon: Acie Fredrickson Wonda Cheng, MD;  Location: [facility];  Service: Cardiovascular;  Laterality: N/A;  . EYE SURGERY Left 1990  . IR EXCHANGE BILIARY DRAIN  02/11/2019  . IR EXCHANGE BILIARY DRAIN  06/09/2019  . IR IVC FILTER PLMT / S&I /IMG GUID/MOD SED  12/04/2018  . IR PERC CHOLECYSTOSTOMY  12/13/2018  . IR PERCUTANEOUS ART THROMBECTOMY/INFUSION INTRACRANIAL INC DIAG ANGIO  09/05/2016  . IR RADIOLOGIST EVAL  & MGMT  10/03/2016  . RADIOLOGY WITH ANESTHESIA N/A 09/05/2016   Procedure: RADIOLOGY WITH ANESTHESIA;  Surgeon: Luanne Bras, MD;  Location: [facility];  Service: Radiology;  Laterality: N/A;  . RIGHT/LEFT HEART CATH AND CORONARY ANGIOGRAPHY N/A 11/03/2018   Procedure: RIGHT/LEFT HEART CATH AND CORONARY ANGIOGRAPHY;  Surgeon: Lorretta Harp, MD;  Location: [facility] CV LAB;  Service: Cardiovascular;  Laterality: N/A;  . TEE WITHOUT CARDIOVERSION N/A 02/06/2019   Procedure: TRANSESOPHAGEAL ECHOCARDIOGRAM (TEE);  Surgeon: Skeet Latch, MD;  Location: [facility];  Service: Cardiovascular;  Laterality: N/A;  . TEE WITHOUT CARDIOVERSION N/A 06/09/2019   Procedure: TRANSESOPHAGEAL ECHOCARDIOGRAM (TEE);  Surgeon: Buford Dresser, MD;  Location: [facility] ENDOSCOPY;  Service: Cardiovascular;  Laterality: N/A;       Family History  Problem Relation Age of Onset  . High blood pressure Mother   . High blood pressure Father   . Stroke Maternal Aunt   . Heart disease Neg Hx     Social History   Tobacco Use  . Smoking status: Former Smoker    Packs/day: 0.50  Types: Cigarettes  . Smokeless tobacco: Never Used  . Tobacco comment: a pack last three days  Substance Use Topics  . Alcohol use: Not Currently    Alcohol/week: 3.0 standard drinks    Types: 3 Cans of beer per week    Comment: pt stop drinking   . Drug use: Not Currently    Frequency: 2.0 times per week    Types: Marijuana    Comment: stop smoking     Home Medications Prior to Admission medications   Medication Sig Start Date End Date Taking? Authorizing Provider  acetaminophen (TYLENOL) 325 MG tablet Take 2 tablets (650 mg total) by mouth every 4 (four) hours as needed for mild pain (or temp > 37.5 C (99.5 F)). 12/02/18   Angiulli, Lavon Paganini, PA-C  albuterol (VENTOLIN HFA) 108 (90 Base) MCG/ACT inhaler SMARTSIG:2 Puff(s) By Mouth Every 4-6 Hours PRN 06/12/19   [provider]  atorvastatin (LIPITOR) 20 MG  tablet TAKE 1 TABLET(20 MG) BY MOUTH DAILY AT 6 PM 03/13/19   [name], MD  calcium carbonate (TUMS - DOSED IN MG ELEMENTAL CALCIUM) 500 MG chewable tablet Chew 1 tablet (200 mg of elemental calcium total) by mouth daily as needed for indigestion or heartburn. 03/12/19   [name], MD  dabigatran ([name]) 150 MG CAPS capsule Take 1 capsule (150 mg total) by mouth 2 (two) times daily. 06/16/19   Sherran Needs, NP  digoxin (LANOXIN) 0.125 MG tablet Take 1 tablet (0.125 mg total) by mouth daily. 05/12/19   Baldwin Jamaica, PA-C  furosemide (LASIX) 40 MG tablet TAKE 1 TABLET BY MOUTH DAILY WITH ONE EXTRA TABLET AS NEEDED FOR FLUID 06/16/19   Sherran Needs, NP  gabapentin (NEURONTIN) 600 MG tablet Take 1 tablet (600 mg total) by mouth 3 (three) times daily. 03/25/19   [name], MD  ipratropium (ATROVENT HFA) 17 MCG/ACT inhaler Inhale 2 puffs into the lungs every 6 (six) hours as needed for wheezing. 03/16/19   [name], MD  levalbuterol [facility] HFA) 45 MCG/ACT inhaler Inhale 1-2 puffs into the lungs every 4 (four) hours as needed for wheezing. 03/13/19   [name], MD  metoprolol succinate (TOPROL-XL) 100 MG 24 hr tablet Take one tablet by mouth daily 06/16/19   Sherran Needs, NP  pantoprazole (PROTONIX) 40 MG tablet TAKE 1 TABLET(40 MG) BY MOUTH DAILY 03/13/19   [name], MD  potassium chloride SA (KLOR-CON) 20 MEQ tablet TAKE 1 TABLET(20 MEQ) BY MOUTH DAILY 04/20/19   [name], MD  sacubitril-valsartan (ENTRESTO) 24-26 MG Take 1 tablet by mouth 2 (two) times daily. 03/12/19   [name], MD  spironolactone (ALDACTONE) 25 MG tablet Take 1 tablet (25 mg total) by mouth daily. 06/16/19   Sherran Needs, NP  traMADol (ULTRAM) 50 MG tablet Take 1 tablet (50 mg total) by mouth every 8 (eight) hours as needed for severe pain. 03/12/19   [name], MD    Allergies    Benadryl [diphenhydramine]  Review of Systems   Review of Systems  Constitutional:  Positive for activity change.  Respiratory: Positive for shortness of breath.   Cardiovascular: Positive for palpitations.  Skin: Positive for wound.  Neurological: Positive for dizziness.  All other systems reviewed and are negative.   Physical Exam Updated Vital Signs BP 116/73   Pulse 94   Temp 98.3 F (36.8 C) (Oral)   Resp (!) 35   Ht 5\' 7"  (1.702 m)   Wt 79.4 kg  SpO2 100%   BMI 27.41 kg/m   Physical Exam Vitals and nursing note reviewed.  Constitutional:      Appearance: He is well-developed.  HENT:     Head: Atraumatic.  Cardiovascular:     Rate and Rhythm: Tachycardia present. Rhythm irregular.  Pulmonary:     Effort: Pulmonary effort is normal.  Musculoskeletal:     Cervical back: Neck supple.     Right lower leg: No edema.     Left lower leg: No edema.  Skin:    General: Skin is warm.     Comments: Right lower chest has a biliary catheter, with dry crusting and scabbing around it. No exudates appreciated. Tenderness to palpation noted.  Neurological:     Mental Status: He is alert and oriented to person, place, and time.     ED Results / Procedures / Treatments   Labs (all labs ordered are listed, but only abnormal results are displayed) Labs Reviewed  CBC - Abnormal; Notable for the following components:      Result Value   RBC 3.78 (*)    Hemoglobin 11.4 (*)    HCT 36.5 (*)    All other components within normal limits  PROTIME-INR - Abnormal; Notable for the following components:   Prothrombin Time 22.3 (*)    INR 2.0 (*)    All other components within normal limits  COMPREHENSIVE METABOLIC PANEL - Abnormal; Notable for the following components:   Glucose, Bld 118 (*)    Creatinine, Ser 1.75 (*)    Total Bilirubin 1.6 (*)    GFR calc non Af Amer 39 (*)    GFR calc Af Amer 46 (*)    All other components within normal limits  RESPIRATORY PANEL BY RT PCR (FLU A&B, COVID)  LIPASE, BLOOD  BRAIN NATRIURETIC PEPTIDE  POC SARS CORONAVIRUS 2 AG  -  ED    EKG EKG Interpretation  Date/Time:  Tuesday Jun 23 2019 11:39:36 EDT Ventricular Rate:  144 PR Interval:    QRS Duration: 80 QT Interval:  279 QTC Calculation: 432 R Axis:   23 Text Interpretation: Atrial fibrillation Anteroseptal infarct, old Abnormal T, consider ischemia, lateral leads No acute changes No significant change since last tracing Confirmed by Varney Biles ([id number]) on 06/23/2019 1:26:41 PM   Radiology DG Chest Port 1 View  Result Date: 06/23/2019 CLINICAL DATA:  Acute shortness of breath EXAM: PORTABLE CHEST 1 VIEW COMPARISON:  06/23/2019 FINDINGS: Single frontal view of the chest demonstrates interval placement of external defibrillator pads. Single lead AICD unchanged. Cardiac silhouette remains enlarged. There is continued vascular congestion with mild interstitial edema. No airspace disease, effusion, or pneumothorax. There is chronic right basilar scarring/atelectasis versus pleural thickening. No acute bony abnormalities. IMPRESSION: 1. Stable vascular congestion and interstitial edema. Electronically Signed   By: Randa Ngo M.D.   On: 06/23/2019 15:14   DG Chest Portable 1 View  Result Date: 06/23/2019 CLINICAL DATA:  Tachycardia.  Shortness of breath. EXAM: PORTABLE CHEST 1 VIEW COMPARISON:  06/01/2019 FINDINGS: 1145 hours. The cardio pericardial silhouette is enlarged. Leftward patient rotation. Similar appearance vascular congestion with possible interstitial edema. Right base atelectasis without substantial pleural effusion. Left pacer/AICD again noted. Telemetry leads overlie the chest. IMPRESSION: Cardiomegaly with vascular congestion and potential component of interstitial edema. Electronically Signed   By: Misty Stanley M.D.   On: 06/23/2019 11:57    Procedures .Critical Care Performed by: Varney Biles, MD Authorized by: Varney Biles, MD   Critical care  provider statement:    Critical care time (minutes):  95   Critical care was necessary  to treat or prevent imminent or life-threatening deterioration of the following conditions:  Circulatory failure and cardiac failure   Critical care was time spent personally by me on the following activities:  Discussions with consultants, evaluation of patient's response to treatment, examination of patient, ordering and performing treatments and interventions, ordering and review of laboratory studies, ordering and review of radiographic studies, pulse oximetry, re-evaluation of patient's condition, obtaining history from patient or surrogate and review of old charts   (including critical care time)  Medications Ordered in ED Medications  sodium chloride flush (NS) 0.9 % injection 3 mL (has no administration in time range)  diltiazem (CARDIZEM) 125 mg in dextrose 5% 125 mL (1 mg/mL) infusion (5 mg/hr Intravenous New Bag/Given 06/23/19 1524)  metoprolol tartrate (LOPRESSOR) injection 5 mg (5 mg Intravenous Given 06/23/19 1409)  midazolam (VERSED) injection 2 mg (2 mg Intravenous Given 06/23/19 1445)    ED Course  I have reviewed the triage vital signs and the nursing notes.  Pertinent labs & imaging results that were available during my care of the patient were reviewed by me and considered in my medical decision making (see chart for details).  Clinical Course as of Jun 22 1533  Tue Jun 23, 2019  1230 Patient has received two rounds of IV 5 mg of Toprol. Heart rate has come down, but not satisfactorily. I do not suspect underlying infectious process. We will proceed with diltiazem drip.   [AN]  1332 Repeat percutaneous catheter was exchanged on 420.  I discussed the case with IR, given that patient reports loosening of the catheter, they have been consulted to come and evaluate it.   [AN]  1535 Patient had sudden onset of shortness of breath. Given that we had given him some beta-blocker and the previous chest x-ray had showed vascular congestion, repeated chest x-ray. The x-ray is not showing  any concerning findings that are new. We will continue with the plan on diltiazem drip.  Patient did say that he has history of panic attacks. He was uncomfortable appearing. We gave him Versed and on reassessment he is looking a lot more comfortable.   [AN]    Clinical Course User Index [AN] Varney Biles, MD   MDM Rules/Calculators/A&P                      69 year old with history of advanced heart failure, COPD, A. fib on [name] and chronic cholecystitis with biliary drain in place comes in a chief complaint of wound evaluation.  It appears that his biliary drain has come loose. Clinically we do not see any evidence of infection. X-ray ordered to ensure there is no internal injuries. IR has been consulted to reassess the biliary drain. No acute surgical issue going on in that area.  Additionally patient is noted to be in A. fib with RVR. He has advanced CHF with A. fib. I have reviewed the notes from cardiology. It appears that patient has had a compliance issue and he has been in and out of A. fib. He also has advanced CHF and previous history of aortic thrombus. Not comfortable cardioverting patient in the setting of known thrombus and noncompliance. We will initiate rate control.  Final Clinical Impression(s) / ED Diagnoses Final diagnoses:  Acute cholecystitis  Atrial fibrillation with RVR ([place])    Rx / DC Orders ED Discharge Orders  None       Varney Biles, MD 06/23/19 1535

## 2019-06-23 NOTE — H&P (Addendum)
Glenn [name] Service Pager: [phone]  Patient name: Glenn [name] Medical record number: [id number] Date of birth: [birth year] Age: 69 y.o. Gender: male  Primary Care Provider: [name], MD Consultants: Cardiology Code Status: full code Preferred Emergency Contact:  [name], son, [phone]  Chief Complaint: problems with biliary drain and atrial fibrillation with RVR   Assessment and Plan: ELLSWORTH [name] is a 69 y.o. male presenting with palpitations, SOB and biliary drain problems, found to be in Afib with RVR. Marland Kitchen PMH is significant for HTN,COPD, chronic cholecystitis with biliary drain (recently exchanged on 4.20/21),  chronic hepatitis C, MCA stroke, ICD, tobacco abuse, pAF([name]), dilated cardiomyopathy, HLD, combined HFrEF <20%, chronic hip pain, hx of DVT.    Persistent Atrial Fibrillation with RVR  ICD in place  Patient with hx of Afib with RVR. ICD in place. Reporting palpations and SOB on admission. Home medications include [name] 150 mg and metoprolol 100 mg. It is  unclear as to how regularly patient takes his prescribed medications as he reports running out of his prescriptions frequently. Follows with Dr. [name] for cardiology. Patient was seen on 5/3 by Dr. [name] with no medication changes; not a candidate for cardioversion due to thrombi seen on TEE. HR max of 147 bpm while in ED. Labs notable for PT 22.3 on admission and INR 2.0, currently reports taking. Afib w/ RVR likely 2/2 medication noncompliance. Patient requires admission for heart rate/rhythm control on amiodarone. Cardiology was consulted and recommended switching from diltiazem drip to amiodarone given his severe HFrEF.  - admit to progressive, attending Dr. Gwendlyn Deutscher - AM CMP  - cardiology consulted, will appreciate further recommendations  - Discontinue diltiazem drip - Start amiodarone with 150 mg will, followed by 60 mg per 6 hours  followed by 30 mg - Cardiac monitoring - switch to oral medications once HR is controlled.  - Monitor vital signs per floor protocol - up with asst  - supplemental oxygen, maintain oxygen sats >90%  Biliary Drain malfunction  Pt's girlfriend appears to have cut the reservoir off his biliary drain leaving an exposed tube. IR consulted to reassess the drain and determined no acute surgical process at this time. Normal lipase 29>24, elevated t bili 1.6.  Patient is reporting epigastric abdominal pain that is 8/10 in severity.  Patient also reports some nausea w/o vomiting. . Chart review shows that patient was advised to follow up with [facility] for tube that was initially placed in Oct of 2020. Typically drains are kept for 6-8 weeks but given poor candidate for surgery, patient has been undergoing serial exchanges with IR as it is believed he will have recurrent cholecystitis due to stone reoccurrence without the drain.   - IR to replace drain on 5/5 - N.p.o. at midnight - Zofran 4 mg - CMP  - continue to monitor abdominal pain - if pt fevers or shows signs of infection consider abdominal imaging.   Acute kidney injury on chronic kidney disease, 3a Cr 1.75, elevated.  GFR 36. Baseline appears to be 1.1. Patient states that he has been eating and drinking normally. Prerenal injury less likely.  Patient home medications include Lasix, spironolactone and entresto.  - Monitor creatinine with CMP - We will avoid nephrotoxic agents for now - Hold home Lasix and Entresto due to AKI  - We will hold off on IV fluids given patient's edema  HFrEF<20% EF Home meds include digoxin, metoprolol 100mg  daily, Entresto 24-26mg , spironolatone  25mg  daily. BNP on admission at 613. Last echo from April 2021 with EF <20% via TEE. CXR shows cardiomegaly with vascular congestion and potential component of interstitial edema.  On exam, patient with left upper extremity and left lower extremity edema, 1+  pitting.  Patient on 2 L via nasal cannula for shortness of breath NAD.   - will hold lasix overnight given his mild symptoms and soft blood pressures. Consider starting in AM.  - Daily weights - We will monitor urine output once diuresis is appropriate  MCA Infarct 10/2018 Patient reported as having large right MCA infarct with hemorrhagic conversion likely secondary to embolism. He was treated with chemical thrombectomy at this time. Appears to have some residual left sided weakness.  - prescribed [name] for Atrial fibrillation AC   Concern for depression  Anxiety  Patient reports previously discussing anxiety depression medication with his PCP.  States that he sometimes feels overwhelmed by health issues and reports being in the hospital for almost 8 months last year.  Patient denies suicidal or homicidal ideation on admission. Patient has prescription for valium  -Recommend PCP discussion for antidepression medication once heart rate stabilized -Monitor mood while hospitalized -valium 10mg  PRN x 2 doses   COPD  Home medications include albuterol, ipratropium, levalbuterol. - We will hold albuterol for now in the setting of tachycardia - Continue albuterol once heart rate within normal - IS spirometry  - continue home atrovent nebulizer q 6 hours   GERD  Patient on home Protonix 40 mg daily.  - continue home protonix   HLD Home atorvastatin 20mg .  - Continue home atorvastatin  Chronic hip pain Home medication includes tramadol 50 mg every 8 hours at home. - continue home tramadol  FEN/GI: heart healthy diet; NPO at MN   Prophylaxis: [name] 150mg   Disposition: admit to progressive   History of Present Illness:  Glenn [name] is a 69 y.o. male presenting with 2 days of biliary drain malfunction and heart palpitations and found to have atrial fibrillation with RVR.  Patient reports that his biliary drain bag getting caught on objects so his significant other helped him  by removing the bag which lifted to somewhat open and leaking.  She reports that after this, he began to have epigastric abdominal pain that he rates as 8/10 in severity.  States that he has some nausea but has not had any episodes of vomiting.  Patient denies any changes to his stool appearance, had a bowel movement the morning of presentation to the ED and has had no diarrhea.  Patient denies changes in his urine appearance or changes in frequency, denies dysuria.  Denies any hematochezia.  Patient denies any recent upper respiratory illness symptoms such as sore throat or head ache.  Denies any cough, fevers or chills.  Patient also reports some pain in his left foot with when he attempts to ambulate, reports that he did notice swelling in his left lower extremity as well as in his foot on that side.  Patient reports experiencing heart palpitations that started 2 days ago.  States that when the palpitations started, he began to feel short of breath.  Patient denies any chest pain. He reports missing doses of some of his heart medications but inconsistent about which ones.  States he took his pradaxxa this morning.   ED course Patient was given IV metoprolol with no change in his heart rate.  Patient was started on diltiazem drip.  Consulted cardiology who recommended changing  from diltiazem to amiodarone.  Heart rate in ED max up to 130s.  Review Of Systems: Per HPI with the following additions:   Review of Systems  Constitutional: Negative for appetite change and fever.  HENT: Negative for congestion and sore throat.   Respiratory: Positive for shortness of breath. Negative for cough and wheezing.   Cardiovascular: Positive for palpitations and leg swelling. Negative for chest pain.  Gastrointestinal: Positive for abdominal pain and nausea. Negative for blood in stool, constipation, diarrhea and vomiting.  Genitourinary: Negative for decreased urine volume, difficulty urinating, dysuria and  hematuria.  Musculoskeletal:       Left foot pain  Skin: Negative for rash and wound.  Neurological: Negative for headaches.     Patient Active Problem List   Diagnosis Date Noted  . Atrial fibrillation with RVR ([place]) 06/23/2019  . Chronic systolic heart failure ([place]) 06/21/2019  . Persistent atrial fibrillation with RVR ([place])   . Thrombus of left atrial appendage   . Left leg pain 03/27/2019  . History of hemorrhagic stroke with residual hemiparesis ([place]) 02/04/2019  . Left leg DVT ([place]) 02/04/2019  . Cholecystitis 02/04/2019  . Acute deep vein thrombosis (DVT) of both lower extremities ([name])   . Acute blood loss anemia   . Labile blood glucose   . AKI (acute kidney injury) ([place])   . Chronic right hip pain   . Acute renal failure ([place])   . Headache due to intracranial disease 11/14/2018  . Trochanteric bursitis, right hip 11/14/2018  . Cerebral edema ([name]) 11/13/2018  . Hyperlipidemia LDL goal <70 11/13/2018  . Marijuana user 11/13/2018  . Right middle cerebral artery stroke ([place]) 11/13/2018  . Acute CVA (cerebrovascular accident) ([place])   . Noncompliance   . Dysphagia, post-stroke   . Acute on chronic combined systolic and diastolic CHF (congestive heart failure) ([place])   . DCM (dilated cardiomyopathy) ([place])   . Atrial fibrillation ([place]) 11/04/2018  . Elevated troponin   . Acute respiratory failure with hypoxia ([place])   . Acute kidney injury ([place])   . Entrapment of right ulnar nerve 02/28/2018  . Carpal tunnel syndrome of right wrist 02/28/2018  . Impotence due to erectile dysfunction 09/30/2017  . Solitary pulmonary nodule 06/10/2017  . Neck pain 04/06/2017  . Paroxysmal atrial fibrillation ([name])   . ICD (implantable cardioverter-defibrillator) in place 09/13/2016  . Chest pain 09/13/2016  . Tobacco abuse 09/13/2016  . Hospital discharge follow-up 09/13/2016  . Upper back pain 09/13/2016  . Housing problems 09/13/2016  . Ischemic cardiomyopathy   . Cerebral  infarction ([place])   . Stroke (cerebrum) ([name]) Lg L MCA infarct w/ hemorrhagic conversion, embolic d/t AF 10/62/6948  . HTN (hypertension) 08/14/2016  . Chronic Hepatitis C  08/14/2016    Past Medical History: Past Medical History:  Diagnosis Date  . Acute blood loss anemia   . Acute CVA (cerebrovascular accident) ([place])   . Acute deep vein thrombosis (DVT) of both lower extremities ([name])   . Acute kidney injury ([place])   . Acute on chronic combined systolic and diastolic CHF (congestive heart failure) ([place])   . Acute renal failure superimposed on stage 3a chronic kidney disease ([place])   . Acute respiratory failure with hypoxia ([place])   . Atrial fibrillation ([place])   . Carpal tunnel syndrome of right wrist 02/28/2018  . Cerebral edema ([place]) 11/13/2018  . Cerebral infarction ([place])   . CHF (congestive heart failure) ([place])   . Cholecystitis 02/04/2019  . Chronic right hip  pain   . DCM (dilated cardiomyopathy) ([place])   . Dysphagia, post-stroke   . Elevated troponin   . Entrapment of right ulnar nerve 02/28/2018  . Headache due to intracranial disease 11/14/2018  . Hepatitis C   . History of hemorrhagic stroke with residual hemiparesis ([place]) 02/04/2019  . HTN (hypertension) 08/14/2016  . Hyperlipidemia LDL goal <70 11/13/2018  . Hypertension   . ICD (implantable cardioverter-defibrillator) in place 09/13/2016  . Impotence due to erectile dysfunction 09/30/2017  . Ischemic cardiomyopathy   . Labile blood glucose   . Left leg DVT ([place]) 02/04/2019  . Marijuana user 11/13/2018  . Paroxysmal atrial fibrillation ([name])   . Right middle cerebral artery stroke ([place]) 11/13/2018  . Solitary pulmonary nodule 06/10/2017   5 mm RUL nodule noted incidentally as part of CVA workup 08/2016. With smoking history would obtain low-dose CT scan 08/2017.   . Stroke (cerebrum) ([name]) Lg L MCA infarct w/ hemorrhagic conversion, embolic d/t AF 0/25/8527  . Stroke ([place])   . Trochanteric bursitis, right hip 11/14/2018  .  Visit for monitoring Tikosyn therapy 03/26/2017    Past Surgical History: Past Surgical History:  Procedure Laterality Date  . CARDIAC DEFIBRILLATOR PLACEMENT  2015  . CARDIOVERSION N/A 10/10/2016   Procedure: CARDIOVERSION;  Surgeon: Dorothy Spark, MD;  Location: [facility];  Service: Cardiovascular;  Laterality: N/A;  . CARDIOVERSION N/A 03/27/2017   Procedure: CARDIOVERSION;  Surgeon: Jerline Pain, MD;  Location: [facility] ENDOSCOPY;  Service: Cardiovascular;  Laterality: N/A;  . CARDIOVERSION N/A 10/29/2018   Procedure: CARDIOVERSION;  Surgeon: Sanda Klein, MD;  Location: [facility] ENDOSCOPY;  Service: Cardiovascular;  Laterality: N/A;  . CARDIOVERSION N/A 11/05/2018   Procedure: CARDIOVERSION;  Surgeon: Acie Fredrickson Wonda Cheng, MD;  Location: [facility];  Service: Cardiovascular;  Laterality: N/A;  . EYE SURGERY Left 1990  . IR EXCHANGE BILIARY DRAIN  02/11/2019  . IR EXCHANGE BILIARY DRAIN  06/09/2019  . IR IVC FILTER PLMT / S&I /IMG GUID/MOD SED  12/04/2018  . IR PERC CHOLECYSTOSTOMY  12/13/2018  . IR PERCUTANEOUS ART THROMBECTOMY/INFUSION INTRACRANIAL INC DIAG ANGIO  09/05/2016  . IR RADIOLOGIST EVAL & MGMT  10/03/2016  . RADIOLOGY WITH ANESTHESIA N/A 09/05/2016   Procedure: RADIOLOGY WITH ANESTHESIA;  Surgeon: Luanne Bras, MD;  Location: [facility];  Service: Radiology;  Laterality: N/A;  . RIGHT/LEFT HEART CATH AND CORONARY ANGIOGRAPHY N/A 11/03/2018   Procedure: RIGHT/LEFT HEART CATH AND CORONARY ANGIOGRAPHY;  Surgeon: Lorretta Harp, MD;  Location: [facility];  Service: Cardiovascular;  Laterality: N/A;  . TEE WITHOUT CARDIOVERSION N/A 02/06/2019   Procedure: TRANSESOPHAGEAL ECHOCARDIOGRAM (TEE);  Surgeon: Skeet Latch, MD;  Location: [facility];  Service: Cardiovascular;  Laterality: N/A;  . TEE WITHOUT CARDIOVERSION N/A 06/09/2019   Procedure: TRANSESOPHAGEAL ECHOCARDIOGRAM (TEE);  Surgeon: Buford Dresser, MD;  Location: [facility] ENDOSCOPY;  Service: Cardiovascular;   Laterality: N/A;    Social History: Social History   Tobacco Use  . Smoking status: Former Smoker    Packs/day: 0.50    Types: Cigarettes  . Smokeless tobacco: Never Used  . Tobacco comment: a pack last three days  Substance Use Topics  . Alcohol use: Not Currently    Alcohol/week: 3.0 standard drinks    Types: 3 Cans of beer per week    Comment: pt stop drinking   . Drug use: Not Currently    Frequency: 2.0 times per week    Types: Marijuana    Comment: stop smoking    Family History:  Family History  Problem Relation Age of Onset  . High blood pressure Mother   . High blood pressure Father   . Stroke Maternal Aunt   . Heart disease Neg Hx    Allergies and Medications: Allergies  Allergen Reactions  . Benadryl [Diphenhydramine] Palpitations   No current facility-administered medications on file prior to encounter.   Current Outpatient Medications on File Prior to Encounter  Medication Sig Dispense Refill  . albuterol (VENTOLIN HFA) 108 (90 Base) MCG/ACT inhaler SMARTSIG:2 Puff(s) By Mouth Every 4-6 Hours PRN    . atorvastatin (LIPITOR) 20 MG tablet TAKE 1 TABLET(20 MG) BY MOUTH DAILY AT 6 PM 90 tablet 0  . calcium carbonate (TUMS - DOSED IN MG ELEMENTAL CALCIUM) 500 MG chewable tablet Chew 1 tablet (200 mg of elemental calcium total) by mouth daily as needed for indigestion or heartburn. 30 tablet 1  . dabigatran ([name]) 150 MG CAPS capsule Take 1 capsule (150 mg total) by mouth 2 (two) times daily. 180 capsule 0  . digoxin (LANOXIN) 0.125 MG tablet Take 1 tablet (0.125 mg total) by mouth daily. 90 tablet 1  . furosemide (LASIX) 40 MG tablet TAKE 1 TABLET BY MOUTH DAILY WITH ONE EXTRA TABLET AS NEEDED FOR FLUID 180 tablet 2  . ipratropium (ATROVENT HFA) 17 MCG/ACT inhaler Inhale 2 puffs into the lungs every 6 (six) hours as needed for wheezing. 2 Inhaler 0  . metoprolol succinate (TOPROL-XL) 100 MG 24 hr tablet Take one tablet by mouth daily 90 tablet 0  .  pantoprazole (PROTONIX) 40 MG tablet TAKE 1 TABLET(40 MG) BY MOUTH DAILY 90 tablet 0  . potassium chloride SA (KLOR-CON) 20 MEQ tablet TAKE 1 TABLET(20 MEQ) BY MOUTH DAILY 90 tablet 0  . sacubitril-valsartan (ENTRESTO) 24-26 MG Take 1 tablet by mouth 2 (two) times daily. 60 tablet 0  . spironolactone (ALDACTONE) 25 MG tablet Take 1 tablet (25 mg total) by mouth daily. 90 tablet 0  . traMADol (ULTRAM) 50 MG tablet Take 1 tablet (50 mg total) by mouth every 8 (eight) hours as needed for severe pain. 30 tablet 0  . acetaminophen (TYLENOL) 325 MG tablet Take 2 tablets (650 mg total) by mouth every 4 (four) hours as needed for mild pain (or temp > 37.5 C (99.5 F)).      Objective: BP 106/88   Pulse 86   Temp 97.7 F (36.5 C) (Axillary)   Resp 14   Ht 5\' 7"  (1.702 m)   Wt 75.9 kg   SpO2 100%   BMI 26.22 kg/m   Exam: General: Male appearing stated age lying in bed, increased work of breathing Eyes: Scleral icterus, extraocular muscles intact bilaterally, pupils equal and reactive to light bilaterally ENTM: Mucous membranes appear moist, cracked lips Neck: No anterior posterior auricular cervical lymphadenopathy or enlarged nodes, do not palpate enlarged thyroid Cardiovascular: Irregular rhythm consistent with atrial fibrillation, tachycardic, radial pulses palpable bilaterally Respiratory: Decreased breath sounds on the right base,  do not appreciate wheezing, some supraclavicular retractions noted, patient on 2 L nasal cannula, no supplemental oxygen at home Gastrointestinal: Tenderness in epigastric region, some tenderness in right lower quadrant, minimal bowel sounds present, right upper quadrant site for entrance of biliary to, some minimal bleeding via tube, site with no bleeding or signs of infection MSK: Left lower extremity and left upper extremity with 1+ edema, no areas of erythema on left lower extremity calf, moves all extremities with normal range of motion Derm: Hyperpigmentation  on bilateral hands,  do not note any ulcerations or nodules Neuro: Alert and oriented x4 Psych: Patient reports that his mood is "down", denies suicidal ideation or homicidal ideation, affect congruent  Labs and Imaging: CBC BMET  Recent Labs  Lab 06/23/19 1235  WBC 5.2  HGB 11.4*  HCT 36.5*  PLT 179   Recent Labs  Lab 06/23/19 1235  NA 138  K 4.7  CL 105  CO2 22  BUN 19  CREATININE 1.75*  GLUCOSE 118*  CALCIUM 9.4     EKG: atrial fibrillation with RVR, HR 147 max   DG Chest Port 1 View  Result Date: 06/23/2019 CLINICAL DATA:  Acute shortness of breath EXAM: PORTABLE CHEST 1 VIEW COMPARISON:  06/23/2019 FINDINGS: Single frontal view of the chest demonstrates interval placement of external defibrillator pads. Single lead AICD unchanged. Cardiac silhouette remains enlarged. There is continued vascular congestion with mild interstitial edema. No airspace disease, effusion, or pneumothorax. There is chronic right basilar scarring/atelectasis versus pleural thickening. No acute bony abnormalities. IMPRESSION: 1. Stable vascular congestion and interstitial edema. Electronically Signed   By: Randa Ngo M.D.   On: 06/23/2019 15:14   DG Chest Portable 1 View  Result Date: 06/23/2019 CLINICAL DATA:  Tachycardia.  Shortness of breath. EXAM: PORTABLE CHEST 1 VIEW COMPARISON:  06/01/2019 FINDINGS: 1145 hours. The cardio pericardial silhouette is enlarged. Leftward patient rotation. Similar appearance vascular congestion with possible interstitial edema. Right base atelectasis without substantial pleural effusion. Left pacer/AICD again noted. Telemetry leads overlie the chest. IMPRESSION: Cardiomegaly with vascular congestion and potential component of interstitial edema. Electronically Signed   By: Misty Stanley M.D.   On: 06/23/2019 11:57    [name], MD 06/23/2019, 8:30 PM PGY-1, [place] Intern pager: [phone], text pages welcome  Resident Addendum I  have separately seen and examined the patient.  I have discussed the findings and exam with the resident and agree with the above note.  I helped develop the management plan that is described in the resident's note and I agree with the content.  Changes have been made in BLUE.    Addison Naegeli, MD PGY-2 [facility] residency program

## 2019-06-23 NOTE — Addendum Note (Signed)
Encounter addended by: Sherran Needs, NP on: 06/23/2019 8:42 AM  Actions taken: Level of Service modified

## 2019-06-23 NOTE — Hospital Course (Addendum)
Glenn [name] is a 69 year old male who presented to the ED with concerns for biliary drain malfunction and found to be in atrial fibrillation with RVR. PMH significant for ***    Atrial Fibrillation with RVR  Patient presented with HR up to 147 and in AF. Patient was initially treated with IV metoprolol with no improvement in his HR so he was started on a diltiazem drip. Upon admission, cardiology was consulted and patient was changed to amiodarone drip. Labs on admission were notable for BNP 613 (close to patient's baseline), ***. PE was notable for increased edema in left upper and lower extremities. Patient was also desaturating and with new ooxygen requirement on admission.  Cardiology work up eventually resulted in transfer to the cardiac ICU on 06/25/19 and patient was continued on amiodarone, started on milrinone and *** 5/7: Patient underwent successful IAB insertion with good augmentation and ventricular rate slowing.

## 2019-06-23 NOTE — ED Notes (Signed)
Pt LWBS on Monday evening.the patient was here Monday because his  Drain tube was cut  By his So. Pt returned today because he felt worse. Pt HR 145 -150 on arival to room

## 2019-06-24 ENCOUNTER — Inpatient Hospital Stay (HOSPITAL_COMMUNITY): Payer: Medicare (Managed Care)

## 2019-06-24 ENCOUNTER — Encounter (HOSPITAL_COMMUNITY)
Admission: EM | Disposition: A | Discharge status: Skilled Nursing Facility | Payer: Self-pay | Source: Home / Self Care | Attending: Internal Medicine

## 2019-06-24 DIAGNOSIS — I5043 Acute on chronic combined systolic (congestive) and diastolic (congestive) heart failure: Secondary | ICD-10-CM | POA: Diagnosis not present

## 2019-06-24 DIAGNOSIS — I472 Ventricular tachycardia: Secondary | ICD-10-CM | POA: Diagnosis not present

## 2019-06-24 DIAGNOSIS — R570 Cardiogenic shock: Secondary | ICD-10-CM

## 2019-06-24 DIAGNOSIS — I4891 Unspecified atrial fibrillation: Secondary | ICD-10-CM | POA: Diagnosis not present

## 2019-06-24 HISTORY — PX: RIGHT HEART CATH: CATH118263

## 2019-06-24 HISTORY — PX: IR CATHETER TUBE CHANGE: IMG717

## 2019-06-24 LAB — POCT I-STAT EG7
Acid-base deficit: 2 mmol/L (ref 0.0–2.0)
Acid-base deficit: 3 mmol/L — ABNORMAL HIGH (ref 0.0–2.0)
Bicarbonate: 21.8 mmol/L (ref 20.0–28.0)
Bicarbonate: 22.8 mmol/L (ref 20.0–28.0)
Calcium, Ion: 1.18 mmol/L (ref 1.15–1.40)
Calcium, Ion: 1.22 mmol/L (ref 1.15–1.40)
HCT: 36 % — ABNORMAL LOW (ref 39.0–52.0)
HCT: 37 % — ABNORMAL LOW (ref 39.0–52.0)
Hemoglobin: 12.2 g/dL — ABNORMAL LOW (ref 13.0–17.0)
Hemoglobin: 12.6 g/dL — ABNORMAL LOW (ref 13.0–17.0)
O2 Saturation: 54 %
O2 Saturation: 61 %
Potassium: 3.9 mmol/L (ref 3.5–5.1)
Potassium: 4 mmol/L (ref 3.5–5.1)
Sodium: 141 mmol/L (ref 135–145)
Sodium: 142 mmol/L (ref 135–145)
TCO2: 23 mmol/L (ref 22–32)
TCO2: 24 mmol/L (ref 22–32)
pCO2, Ven: 35.4 mmHg — ABNORMAL LOW (ref 44.0–60.0)
pCO2, Ven: 36.9 mmHg — ABNORMAL LOW (ref 44.0–60.0)
pH, Ven: 7.397 (ref 7.250–7.430)
pH, Ven: 7.399 (ref 7.250–7.430)
pO2, Ven: 28 mmHg — CL (ref 32.0–45.0)
pO2, Ven: 31 mmHg — CL (ref 32.0–45.0)

## 2019-06-24 LAB — COMPREHENSIVE METABOLIC PANEL
ALT: 32 U/L (ref 0–44)
AST: 62 U/L — ABNORMAL HIGH (ref 15–41)
Albumin: 3.8 g/dL (ref 3.5–5.0)
Alkaline Phosphatase: 75 U/L (ref 38–126)
Anion gap: 10 (ref 5–15)
BUN: 25 mg/dL — ABNORMAL HIGH (ref 8–23)
CO2: 20 mmol/L — ABNORMAL LOW (ref 22–32)
Calcium: 9.2 mg/dL (ref 8.9–10.3)
Chloride: 107 mmol/L (ref 98–111)
Creatinine, Ser: 2.78 mg/dL — ABNORMAL HIGH (ref 0.61–1.24)
GFR calc Af Amer: 26 mL/min — ABNORMAL LOW (ref >60)
GFR calc non Af Amer: 23 mL/min — ABNORMAL LOW (ref >60)
Glucose, Bld: 115 mg/dL — ABNORMAL HIGH (ref 70–99)
Potassium: 4.6 mmol/L (ref 3.5–5.1)
Sodium: 137 mmol/L (ref 135–145)
Total Bilirubin: 1.1 mg/dL (ref 0.3–1.2)
Total Protein: 6.9 g/dL (ref 6.5–8.1)

## 2019-06-24 LAB — COOXEMETRY PANEL
Carboxyhemoglobin: 1.2 % (ref 0.5–1.5)
Methemoglobin: 0.4 % (ref 0.0–1.5)
O2 Saturation: 52.2 %
Total hemoglobin: 11.2 g/dL — ABNORMAL LOW (ref 12.0–16.0)

## 2019-06-24 LAB — PROTIME-INR
INR: 2 — ABNORMAL HIGH (ref 0.8–1.2)
Prothrombin Time: 22.1 seconds — ABNORMAL HIGH (ref 11.4–15.2)

## 2019-06-24 SURGERY — RIGHT HEART CATH
Anesthesia: LOCAL

## 2019-06-24 MED ORDER — SODIUM CHLORIDE 0.9 % IV SOLN: 250.0000 mL | INTRAVENOUS | Status: DC | PRN

## 2019-06-24 MED ORDER — LIDOCAINE HCL 1 % IJ SOLN
INTRAMUSCULAR | Status: AC
  Filled 2019-06-24: qty 20

## 2019-06-24 MED ORDER — MILRINONE LACTATE IN DEXTROSE 20-5 MG/100ML-% IV SOLN
0.2500 ug/kg/min | INTRAVENOUS | Status: DC
  Administered 2019-06-24: 0.125 ug/kg/min via INTRAVENOUS
  Administered 2019-06-25: 0.25 ug/kg/min via INTRAVENOUS
  Filled 2019-06-24 (×2): qty 100

## 2019-06-24 MED ORDER — AMIODARONE LOAD VIA INFUSION
150.0000 mg | Freq: Once | INTRAVENOUS | Status: AC
  Administered 2019-06-24: 150 mg via INTRAVENOUS
  Filled 2019-06-24: qty 83.34

## 2019-06-24 MED ORDER — AMIODARONE HCL IN DEXTROSE 360-4.14 MG/200ML-% IV SOLN
60.0000 mg/h | INTRAVENOUS | Status: DC
  Administered 2019-06-24 (×2): 60 mg/h via INTRAVENOUS
  Filled 2019-06-24 (×2): qty 200

## 2019-06-24 MED ORDER — SODIUM CHLORIDE 0.9% FLUSH
3.0000 mL | Freq: Two times a day (BID) | INTRAVENOUS | Status: DC
  Administered 2019-06-25 – 2019-06-27 (×4): 3 mL via INTRAVENOUS

## 2019-06-24 MED ORDER — HEPARIN (PORCINE) IN NACL 1000-0.9 UT/500ML-% IV SOLN
INTRAVENOUS | Status: DC | PRN
  Administered 2019-06-24: 500 mL

## 2019-06-24 MED ORDER — SODIUM CHLORIDE 0.9% FLUSH: 3.0000 mL | INTRAVENOUS | Status: DC | PRN

## 2019-06-24 MED ORDER — FENTANYL CITRATE (PF) 100 MCG/2ML IJ SOLN
INTRAMUSCULAR | Status: AC
  Filled 2019-06-24: qty 2

## 2019-06-24 MED ORDER — SODIUM CHLORIDE 0.9% FLUSH
10.0000 mL | Freq: Two times a day (BID) | INTRAVENOUS | Status: DC
  Administered 2019-06-24: 10 mL
  Administered 2019-06-25: 18:00:00 40 mL
  Administered 2019-06-25 – 2019-06-27 (×3): 10 mL

## 2019-06-24 MED ORDER — MIDAZOLAM HCL 5 MG/5ML IJ SOLN
INTRAMUSCULAR | Status: AC
  Filled 2019-06-24: qty 5

## 2019-06-24 MED ORDER — FUROSEMIDE 10 MG/ML IJ SOLN
80.0000 mg | Freq: Two times a day (BID) | INTRAMUSCULAR | Status: DC
  Filled 2019-06-24: qty 8

## 2019-06-24 MED ORDER — SODIUM CHLORIDE 0.9 % IV SOLN: INTRAVENOUS | Status: DC

## 2019-06-24 MED ORDER — MIDAZOLAM HCL 2 MG/2ML IJ SOLN
INTRAMUSCULAR | Status: DC | PRN
  Administered 2019-06-24: 1 mg via INTRAVENOUS

## 2019-06-24 MED ORDER — SODIUM CHLORIDE 0.9% FLUSH: 10.0000 mL | INTRAVENOUS | Status: DC | PRN

## 2019-06-24 MED ORDER — AMIODARONE HCL IN DEXTROSE 360-4.14 MG/200ML-% IV SOLN
60.0000 mg/h | INTRAVENOUS | Status: DC
  Administered 2019-06-25: 30 mg/h via INTRAVENOUS
  Administered 2019-06-25: 60 mg/h via INTRAVENOUS
  Administered 2019-06-25 (×2): 30 mg/h via INTRAVENOUS
  Administered 2019-06-26 – 2019-07-08 (×47): 60 mg/h via INTRAVENOUS
  Filled 2019-06-24: qty 200
  Filled 2019-06-24: qty 400
  Filled 2019-06-24 (×14): qty 200
  Filled 2019-06-24: qty 400
  Filled 2019-06-24 (×15): qty 200
  Filled 2019-06-24: qty 400
  Filled 2019-06-24 (×15): qty 200

## 2019-06-24 MED ORDER — HEPARIN (PORCINE) 25000 UT/250ML-% IV SOLN
1050.0000 [IU]/h | INTRAVENOUS | Status: DC
  Administered 2019-06-25: 1100 [IU]/h via INTRAVENOUS
  Administered 2019-06-26 – 2019-06-28 (×4): 1250 [IU]/h via INTRAVENOUS
  Administered 2019-06-29 (×2): 1350 [IU]/h via INTRAVENOUS
  Administered 2019-06-30: 1300 [IU]/h via INTRAVENOUS
  Administered 2019-07-01: 1200 [IU]/h via INTRAVENOUS
  Administered 2019-07-02 – 2019-07-05 (×4): 1050 [IU]/h via INTRAVENOUS
  Filled 2019-06-24 (×13): qty 250

## 2019-06-24 MED ORDER — FENTANYL CITRATE (PF) 100 MCG/2ML IJ SOLN
INTRAMUSCULAR | Status: DC | PRN
  Administered 2019-06-24: 25 ug via INTRAVENOUS

## 2019-06-24 MED ORDER — HEPARIN (PORCINE) 25000 UT/250ML-% IV SOLN
1100.0000 [IU]/h | INTRAVENOUS | Status: DC
  Administered 2019-06-24: 1100 [IU]/h via INTRAVENOUS
  Filled 2019-06-24: qty 250

## 2019-06-24 MED ORDER — LIDOCAINE HCL (PF) 1 % IJ SOLN
INTRAMUSCULAR | Status: AC
  Filled 2019-06-24: qty 30

## 2019-06-24 MED ORDER — ASPIRIN 81 MG PO CHEW: 81.0000 mg | CHEWABLE_TABLET | ORAL | Status: DC

## 2019-06-24 MED ORDER — CHLORHEXIDINE GLUCONATE CLOTH 2 % EX PADS
6.0000 | MEDICATED_PAD | Freq: Every day | CUTANEOUS | Status: DC
  Administered 2019-06-24 – 2019-07-16 (×19): 6 via TOPICAL

## 2019-06-24 MED ORDER — FUROSEMIDE 10 MG/ML IJ SOLN
80.0000 mg | Freq: Two times a day (BID) | INTRAMUSCULAR | Status: DC
  Administered 2019-06-24 – 2019-06-26 (×5): 80 mg via INTRAVENOUS
  Filled 2019-06-24 (×5): qty 8

## 2019-06-24 MED ORDER — HEPARIN (PORCINE) IN NACL 1000-0.9 UT/500ML-% IV SOLN
INTRAVENOUS | Status: AC
  Filled 2019-06-24: qty 500

## 2019-06-24 MED ORDER — IPRATROPIUM BROMIDE 0.02 % IN SOLN
2.5000 mL | Freq: Two times a day (BID) | RESPIRATORY_TRACT | Status: DC
  Administered 2019-06-24 – 2019-06-25 (×2): 0.5 mg via RESPIRATORY_TRACT
  Filled 2019-06-24 (×2): qty 2.5

## 2019-06-24 MED ORDER — LIDOCAINE HCL (PF) 1 % IJ SOLN
INTRAMUSCULAR | Status: DC | PRN
  Administered 2019-06-24: 5 mL via INTRADERMAL

## 2019-06-24 MED ORDER — LIDOCAINE HCL (PF) 1 % IJ SOLN
INTRAMUSCULAR | Status: DC | PRN
  Administered 2019-06-24: 5 mL

## 2019-06-24 MED ORDER — FUROSEMIDE 10 MG/ML IJ SOLN
40.0000 mg | Freq: Two times a day (BID) | INTRAMUSCULAR | Status: DC
  Administered 2019-06-24: 40 mg via INTRAVENOUS

## 2019-06-24 MED ORDER — HEPARIN (PORCINE) 25000 UT/250ML-% IV SOLN: 1100.0000 [IU]/h | INTRAVENOUS | Status: DC

## 2019-06-24 MED ORDER — SODIUM CHLORIDE 0.9 % IV SOLN
INTRAVENOUS | Status: AC | PRN
  Administered 2019-06-24: 10 mL/h via INTRAVENOUS

## 2019-06-24 SURGICAL SUPPLY — 9 items
CATH SWAN GANZ VIP 7.5F (CATHETERS) ×1 IMPLANT
PACK CARDIAC CATHETERIZATION (CUSTOM PROCEDURE TRAY) ×2 IMPLANT
PROTECTION STATION PRESSURIZED (MISCELLANEOUS) ×2
SHEATH PINNACLE 8F 10CM (SHEATH) ×1 IMPLANT
SLEEVE REPOSITIONING LENGTH 30 (MISCELLANEOUS) ×1 IMPLANT
STATION PROTECTION PRESSURIZED (MISCELLANEOUS) IMPLANT
TRANSDUCER W/STOPCOCK (MISCELLANEOUS) ×2 IMPLANT
TUBING ART PRESS 72  MALE/FEM (TUBING) ×1
TUBING ART PRESS 72 MALE/FEM (TUBING) IMPLANT

## 2019-06-24 NOTE — Consult Note (Addendum)
Cardiology Consultation:   Patient ID: [name] MRN: [id number]; DOB: Jul 04, 1950  Admit date: 06/23/2019 Date of Consult: 06/24/2019  Primary Care Provider: [name], MD Primary Cardiologist: Virl Axe, MD    Patient Profile:   Glenn [name] is a 69 y.o. male with a hx of NICM, chronic CHF (systolic), ICD, Hep C, stroke (after missed doses of his Xarelto >> eliquis, recurrent stroke >> [name]), HTN who is being seen today for the evaluation of AFib w/RVR at the request of Dr. McDiarmid.  Device information BSCi single chamber ICD, implanted 01/01/2014  AFib hx diagnosed +/- 2018  AAD Hx Tikosyn started discontinued after his stroke and discontinuation of a/c  History of Present Illness:   Glenn [name] Glenn [name] in the last several months has had a number of hospitalizations for a number of reasons  Admitted 10/30/2018 COPD, CHF exacerbations, notes report medication non compliance including his And PAFib RVR LVEF 15-20%, clean cath Discharged 11/06/2018  Admitted 11/10/2018 Stroke,large territory subacute hemorrhagic infarctin theright MCA territory Cardiology consulted, found in AFlutter 120's Neurology notedLarge R MCA infarct with hemorrhagic conversion, embolic secondary to known AF on Eliquis s/p reversal w/ Kcentra Rate controlled,  Neurology:Future AC regimen once hemorrhagic conversion resolves Discharged to CIR 11/13/2018 Discharged from [place] 12/02/2018 and Readmitted to in-patient Acute care  Febrile, rapid AFib , suspect sepsis, abd pain, respiratory distress amio gtt used for rate control (given severe LV dysfunction avoiding CCB and failed BB historically) Found with extensive DVT and IVC filter placed sepsis felt 2/2 aspiration pneumonia and acute cholecystitis >> drain placed Blood cultures  11/25/2018: nagative x2 (5 days) 12/02/2018: negative x2 (5 days) 12/05/2018: one of 2 + for coag neg staph species (the  other neg x5 days) Cardiology followed, was rate controlled at discharge, observed to have frequent PVCs Discharged on 12/24/2018 to SNF On pradaxa (though apparently not taking at home from the SNF  Admitted 02/04/2019 SOB, rapid AFib, CHF Started on amio gtt EP consulted rec TEE/DCCV TEE noted LVEF 15-20%, LA thrombus and no DCCV completed Discharged 02/07/2019 pradaxa dig Metoprolol Entresto Aldactone   I saw him 05/12/19 noted with AFib RVR 130's, he was not taking his metoprolol and dig, for unclear reasons.   He was stable, feeling well, though aware if his Afib, his metoprolol and dig was resumed with plans for close follow up. He cancelled his 1 week follow up and rescheduled for last week, he no-showed to that visit. I had opportunity to discuss his case with Dr. [name] and rate control strategy Timathy Newberry be his plan going forward.  06/01/19 he had a ER visit with some volume OL and fast AFib, dietary indulgences with BBQ the day prior, he was rate controlled in the ER, he was feeling betterrecommended diuretic and Outpt follow up (still  Had bilary drain in)  4/14 OV noted again medication noncompliance and rates 140's 06/22/19 saw Dr. [name], [place] 06/09/19 noted LAA thrombus, and not cardioverted, his HR was 141, despite significant office support from [facility] and the AFib clinic in effort to assure medication compliance, again was unclear what if any medicines he was taking including his [place].  Suspected next step would be AV node ablation. He noted his colecystostomy bad was cut, was bandaged and sent to the ER  He was admitted yesterday for IR management of his tube and surgical input on removal timing, ACute, chronic CHF, AFib RVR and Acute kidney injury  He was started on amiodarone gtt, looks  like dilt gtt briefly, ordered PRN IV lopressor (seems in discussion with cards) I do not see that he has gotten any diuretic [name] stopped (current Creat is 2.78 clearance is 28) and is  pending new new tube placement Home dig stopped Home Toprol initially resumed though stopped Home entresto, aldactone and K+ stopped  EP is asked to help with HR control  LABS K+ 4.6 BUN 25 Creat 1.68 > 1.75 > 2.78 (baseline looks 1-1.2) WBC 5.2 H/H 11/36 Plts 179 INR 2.0 (?) BNP 613  BP stable  No output charted, but reports making urine  He feels poorly, weak, some SOB, no CP.  Mentions several days of generalized abdominal bloating, discomfort, reports BM today that was normal, no blood.  No N/V  No syncope He is aware of his AF  Past Medical History:  Diagnosis Date  . Acute blood loss anemia   . Acute CVA (cerebrovascular accident) ([place])   . Acute deep vein thrombosis (DVT) of both lower extremities ([name])   . Acute kidney injury ([place])   . Acute on chronic combined systolic and diastolic CHF (congestive heart failure) ([place])   . Acute renal failure superimposed on stage 3a chronic kidney disease ([place])   . Acute respiratory failure with hypoxia ([place])   . Atrial fibrillation ([place])   . Carpal tunnel syndrome of right wrist 02/28/2018  . Cerebral edema ([place]) 11/13/2018  . Cerebral infarction ([place])   . CHF (congestive heart failure) ([place])   . Cholecystitis 02/04/2019  . Chronic right hip pain   . DCM (dilated cardiomyopathy) ([place])   . Dysphagia, post-stroke   . Elevated troponin   . Entrapment of right ulnar nerve 02/28/2018  . Headache due to intracranial disease 11/14/2018  . Hepatitis C   . History of hemorrhagic stroke with residual hemiparesis ([place]) 02/04/2019  . HTN (hypertension) 08/14/2016  . Hyperlipidemia LDL goal <70 11/13/2018  . Hypertension   . ICD (implantable cardioverter-defibrillator) in place 09/13/2016  . Impotence due to erectile dysfunction 09/30/2017  . Ischemic cardiomyopathy   . Labile blood glucose   . Left leg DVT ([place]) 02/04/2019  . Marijuana user 11/13/2018  . Paroxysmal atrial fibrillation ([name])   . Right middle cerebral artery stroke ([place])  11/13/2018  . Solitary pulmonary nodule 06/10/2017   5 mm RUL nodule noted incidentally as part of CVA workup 08/2016. With smoking history would obtain low-dose CT scan 08/2017.   . Stroke (cerebrum) ([name]) Lg L MCA infarct w/ hemorrhagic conversion, embolic d/t AF 04/12/9796  . Stroke ([place])   . Trochanteric bursitis, right hip 11/14/2018  . Visit for monitoring Tikosyn therapy 03/26/2017    Past Surgical History:  Procedure Laterality Date  . CARDIAC DEFIBRILLATOR PLACEMENT  2015  . CARDIOVERSION N/A 10/10/2016   Procedure: CARDIOVERSION;  Surgeon: Dorothy Spark, MD;  Location: [place];  Service: Cardiovascular;  Laterality: N/A;  . CARDIOVERSION N/A 03/27/2017   Procedure: CARDIOVERSION;  Surgeon: Jerline Pain, MD;  Location[facility] ENDOSCOPY;  Service: Cardiovascular;  Laterality: N/A;  . CARDIOVERSION N/A 10/29/2018   Procedure: CARDIOVERSION;  Surgeon: Sanda Klein, MD;  Location: [place] ENDOSCOPY;  Service: Cardiovascular;  Laterality: N/A;  . CARDIOVERSION N/A 11/05/2018   Procedure: CARDIOVERSION;  Surgeon: Acie Fredrickson Wonda Cheng, MD;  Location: [place];  Service: Cardiovascular;  Laterality: N/A;  . EYE SURGERY Left 1990  . IR EXCHANGE BILIARY DRAIN  02/11/2019  . IR EXCHANGE BILIARY DRAIN  06/09/2019  . IR IVC FILTER PLMT / S&I Burke Keels GUID/MOD  SED  12/04/2018  . IR PERC CHOLECYSTOSTOMY  12/13/2018  . IR PERCUTANEOUS ART THROMBECTOMY/INFUSION INTRACRANIAL INC DIAG ANGIO  09/05/2016  . IR RADIOLOGIST EVAL & MGMT  10/03/2016  . RADIOLOGY WITH ANESTHESIA N/A 09/05/2016   Procedure: RADIOLOGY WITH ANESTHESIA;  Surgeon: Luanne Bras, MD;  Location: [place];  Service: Radiology;  Laterality: N/A;  . RIGHT/LEFT HEART CATH AND CORONARY ANGIOGRAPHY N/A 11/03/2018   Procedure: RIGHT/LEFT HEART CATH AND CORONARY ANGIOGRAPHY;  Surgeon: Lorretta Harp, MD;  Location: [place] CV LAB;  Service: Cardiovascular;  Laterality: N/A;  . TEE WITHOUT CARDIOVERSION N/A 02/06/2019   Procedure:  TRANSESOPHAGEAL ECHOCARDIOGRAM (TEE);  Surgeon: Skeet Latch, MD;  Location: [place];  Service: Cardiovascular;  Laterality: N/A;  . TEE WITHOUT CARDIOVERSION N/A 06/09/2019   Procedure: TRANSESOPHAGEAL ECHOCARDIOGRAM (TEE);  Surgeon: Buford Dresser, MD;  Location[facility] ENDOSCOPY;  Service: Cardiovascular;  Laterality: N/A;     Home Medications:  Prior to Admission medications   Medication Sig Start Date End Date Taking? Authorizing Provider  acetaminophen (TYLENOL) 325 MG tablet Take 2 tablets (650 mg total) by mouth every 4 (four) hours as needed for mild pain (or temp > 37.5 C (99.5 F)). 12/02/18  Yes Angiulli, Lavon Paganini, PA-C  albuterol (VENTOLIN HFA) 108 (90 Base) MCG/ACT inhaler Inhale 2 puffs into the lungs every 4 (four) hours as needed for wheezing or shortness of breath.  06/12/19  Yes [provider]  atorvastatin (LIPITOR) 20 MG tablet TAKE 1 TABLET(20 MG) BY MOUTH DAILY AT 6 PM Patient taking differently: Take 20 mg by mouth every evening.  03/13/19  Yes [name], MD  calcium carbonate (TUMS - DOSED IN MG ELEMENTAL CALCIUM) 500 MG chewable tablet Chew 1 tablet (200 mg of elemental calcium total) by mouth daily as needed for indigestion or heartburn. 03/12/19  Yes [name], MD  dabigatran ([name]) 150 MG CAPS capsule Take 1 capsule (150 mg total) by mouth 2 (two) times daily. 06/16/19  Yes Sherran Needs, NP  digoxin (LANOXIN) 0.125 MG tablet Take 1 tablet (0.125 mg total) by mouth daily. 05/12/19  Yes Baldwin Jamaica, PA-C  furosemide (LASIX) 40 MG tablet TAKE 1 TABLET BY MOUTH DAILY WITH ONE EXTRA TABLET AS NEEDED FOR FLUID Patient taking differently: Take 40-80 mg by mouth See admin instructions. TAKE 1 TABLET BY MOUTH DAILY WITH ONE EXTRA TABLET AS NEEDED FOR FLUID 06/16/19  Yes Sherran Needs, NP  ipratropium (ATROVENT HFA) 17 MCG/ACT inhaler Inhale 2 puffs into the lungs every 6 (six) hours as needed for wheezing. 03/16/19  Yes [name],  MD  metoprolol succinate (TOPROL-XL) 100 MG 24 hr tablet Take one tablet by mouth daily Patient taking differently: Take 100 mg by mouth in the morning.  06/16/19  Yes Sherran Needs, NP  pantoprazole (PROTONIX) 40 MG tablet TAKE 1 TABLET(40 MG) BY MOUTH DAILY Patient taking differently: Take 40 mg by mouth daily.  03/13/19  Yes [name], MD  potassium chloride SA (KLOR-CON) 20 MEQ tablet TAKE 1 TABLET(20 MEQ) BY MOUTH DAILY Patient taking differently: Take 20 mEq by mouth daily.  04/20/19  Yes [name], MD  sacubitril-valsartan (ENTRESTO) 24-26 MG Take 1 tablet by mouth 2 (two) times daily. 03/12/19  Yes [name], MD  spironolactone (ALDACTONE) 25 MG tablet Take 1 tablet (25 mg total) by mouth daily. 06/16/19  Yes Sherran Needs, NP  traMADol (ULTRAM) 50 MG tablet Take 1 tablet (50 mg total) by mouth every 8 (eight) hours as  needed for severe pain. 03/12/19  Yes [name], MD    Inpatient Medications: Scheduled Meds: . atorvastatin  20 mg Oral Daily  . ipratropium  2.5 mL Inhalation BID  . lidocaine      . pantoprazole  40 mg Oral Daily  . sodium chloride flush  3 mL Intravenous Once   Continuous Infusions: . amiodarone 30 mg/hr (06/24/19 0721)   PRN Meds: acetaminophen **OR** acetaminophen, calcium carbonate, lidocaine (PF), ondansetron **OR** ondansetron (ZOFRAN) IV, traMADol  Allergies:    Allergies  Allergen Reactions  . Benadryl [Diphenhydramine] Palpitations    Social History:   Social History   Socioeconomic History  . Marital status: Single    Spouse name: Not on file  . Number of children: Not on file  . Years of education: 37 (some college)  . Highest education level: Not on file  Occupational History  . Occupation: disability  Tobacco Use  . Smoking status: Former Smoker    Packs/day: 0.50    Types: Cigarettes  . Smokeless tobacco: Never Used  . Tobacco comment: a pack last three days  Substance and Sexual Activity  . Alcohol use:  Not Currently    Alcohol/week: 3.0 standard drinks    Types: 3 Cans of beer per week    Comment: pt stop drinking   . Drug use: Not Currently    Frequency: 2.0 times per week    Types: Marijuana    Comment: stop smoking   . Sexual activity: Yes    Partners: Female    Birth control/protection: Condom  Other Topics Concern  . Not on file  Social History Narrative  . Not on file   Social Determinants of Health   Financial Resource Strain:   . Difficulty of Paying Living Expenses:   Food Insecurity:   . Worried About Charity fundraiser in the Last Year:   . Arboriculturist in the Last Year:   Transportation Needs:   . Film/video editor (Medical):   Marland Kitchen Lack of Transportation (Non-Medical):   Physical Activity:   . Days of Exercise per Week:   . Minutes of Exercise per Session:   Stress:   . Feeling of Stress :   Social Connections:   . Frequency of Communication with Friends and Family:   . Frequency of Social Gatherings with Friends and Family:   . Attends Religious Services:   . Active Member of Clubs or Organizations:   . Attends Archivist Meetings:   Marland Kitchen Marital Status:   Intimate Partner Violence:   . Fear of Current or Ex-Partner:   . Emotionally Abused:   Marland Kitchen Physically Abused:   . Sexually Abused:     Family History:   Family History  Problem Relation Age of Onset  . High blood pressure Mother   . High blood pressure Father   . Stroke Maternal Aunt   . Heart disease Neg Hx      ROS:  Please see the history of present illness.  All other ROS reviewed and negative.     Physical Exam/Data:   Vitals:   06/24/19 0026 06/24/19 0415 06/24/19 0827 06/24/19 1030  BP: 110/77 (!) 122/98 119/70 110/80  Pulse: (!) 52   89  Resp: 20 (!) 23 (!) 30 20  Temp: 98.4 F (36.9 C) 98.1 F (36.7 C) 97.8 F (36.6 C) 97.7 F (36.5 C)  TempSrc: Axillary Axillary Oral Oral  SpO2: 100% 100% 100% 98%  Weight:  76.2  kg    Height:        Intake/Output  Summary (Last 24 hours) at 06/24/2019 1115 Last data filed at 06/23/2019 2003 Gross per 24 hour  Intake 255.32 ml  Output --  Net 255.32 ml   Last 3 Weights 06/24/2019 06/23/2019 06/23/2019  Weight (lbs) 168 lb 167 lb 6.4 oz 175 lb  Weight (kg) 76.204 kg 75.932 kg 79.379 kg     Body mass index is 26.31 kg/m.  General:  Well nourished, well developed, in no acute distress, looks weak, tired HEENT: normal Lymph: no adenopathy Neck: ++ JVD Endocrine:  No thryomegaly Vascular: No carotid bruits Cardiac: irreg-irreg, tachycardic; 2/6SM Lungs:  Diminished at the bases, no wheezing, rhonchi or rales  Abd: soft, nontender to light palp, + BS  Ext: trace edema Musculoskeletal:  No deformities Skin: warm and dry  Neuro:  CNs 2-12 intact, no focal abnormalities noted Psych:  Normal affect   EKG:  The EKG was personally reviewed and demonstrates:   Afib 144, PVC, no ST changes. Lat T changes not new AFib 101  Telemetry:  Telemetry was personally reviewed and demonstrates:   AFib 90's-110s, PVCs, some NSVT episodes  Relevant CV Studies:  06/09/19: TEE FINDINGS:  LEFT VENTRICLE: EF = <15%. Global hypokinesis.  RIGHT VENTRICLE: Normal size and mildly reduced function.   LEFT ATRIUM: No thrombus/mass. Dilated.  LEFT ATRIAL APPENDAGE: There are two distinct well organized thrombi seen in the LAA.  RIGHT ATRIUM: No thrombus/mass. Chiari network. Dilated.  AORTIC VALVE:  Trileaflet. Mild-moderate regurgitation. No vegetation.  MITRAL VALVE:    Normal structure. Mild regurgitation. No vegetation.  TRICUSPID VALVE: Normal structure. Moderate eccentric regurgitation. No vegetation.  PULMONIC VALVE: Grossly normal structure. Mild-moderate regurgitation. No apparent vegetation.  INTERATRIAL SEPTUM: No PFO or ASD seen by color Doppler.  PERICARDIUM: No effusion noted.  DESCENDING AORTA: Moderate diffuse plaque seen   CONCLUSION: Severely reduced LVEF. Two separate, well  organized thrombi seen in LAA. No cardioversion performed. Given persistent thrombi and afib RVR without ability to cardiovert, contacted both afib clinic and Dr. Lovena Le to alert them to results.   Laboratory Data:  High Sensitivity Troponin:   Recent Labs  Lab 06/01/19 1413 06/01/19 1619  TROPONINIHS 75* 91*     Chemistry Recent Labs  Lab 06/22/19 1555 06/23/19 1235 06/24/19 0526  NA 138 138 137  K 4.2 4.7 4.6  CL 106 105 107  CO2 22 22 20*  GLUCOSE 102* 118* 115*  BUN 16 19 25*  CREATININE 1.68* 1.75* 2.78*  CALCIUM 9.5 9.4 9.2  GFRNONAA 41* 39* 23*  GFRAA 48* 46* 26*  ANIONGAP 10 11 10     Recent Labs  Lab 06/22/19 1555 06/23/19 1235 06/24/19 0526  PROT 7.6 6.9 6.9  ALBUMIN 4.2 3.8 3.8  AST 21 21 62*  ALT 17 15 32  ALKPHOS 77 77 75  BILITOT 1.5* 1.6* 1.1   Hematology Recent Labs  Lab 06/22/19 1555 06/23/19 1235  WBC 3.5* 5.2  RBC 3.86* 3.78*  HGB 11.7* 11.4*  HCT 36.9* 36.5*  MCV 95.6 96.6  MCH 30.3 30.2  MCHC 31.7 31.2  RDW 14.0 14.0  PLT 180 179   BNP Recent Labs  Lab 06/23/19 1527  BNP 613.2*    DDimer No results for input(s): DDIMER in the last 168 hours.   Radiology/Studies:  DG Chest Port 1 View  Result Date: 06/23/2019 CLINICAL DATA:  Acute shortness of breath EXAM: PORTABLE CHEST 1 VIEW COMPARISON:  06/23/2019 FINDINGS:  Single frontal view of the chest demonstrates interval placement of external defibrillator pads. Single lead AICD unchanged. Cardiac silhouette remains enlarged. There is continued vascular congestion with mild interstitial edema. No airspace disease, effusion, or pneumothorax. There is chronic right basilar scarring/atelectasis versus pleural thickening. No acute bony abnormalities. IMPRESSION: 1. Stable vascular congestion and interstitial edema. Electronically Signed   By: Randa Ngo M.D.   On: 06/23/2019 15:14   DG Chest Portable 1 View  Result Date: 06/23/2019 CLINICAL DATA:  Tachycardia.  Shortness of breath.  EXAM: PORTABLE CHEST 1 VIEW COMPARISON:  06/01/2019 FINDINGS: 1145 hours. The cardio pericardial silhouette is enlarged. Leftward patient rotation. Similar appearance vascular congestion with possible interstitial edema. Right base atelectasis without substantial pleural effusion. Left pacer/AICD again noted. Telemetry leads overlie the chest. IMPRESSION: Cardiomegaly with vascular congestion and potential component of interstitial edema. Electronically Signed   By: Misty Stanley M.D.   On: 06/23/2019 11:57   {   Assessment and Plan:   1. Longstanding persistent AFib     CHA2DS2Vasc is 5, on [name] out patient  Long hx of medication noncompliance (unclear why), despite great efforts from office staff 06/09/19 he had TEE WITH LAA THROMBUS I have stopped amiodarone gtt, unlikely to convert him, though given known thrombus and known issue with missing OAC doses regularly, I am uncomfortable with amio for rate control  Spoke with Dr. Vernard Gambles who did his tube exchange, no need to wait post procedure for heparin gtt  2. Chronic CHF 3. NICM, LVEF 15% 4. NSVT     Has ICD     On GDMT outpt but likely does not take all the time     CXR with edema     Creat with acute rise suspect 2/2 CHF   Discussed case with AHF NP, they Arial Galligan see the patient, appreciate their help Koben Daman start IV lasix BP is better but unfortunately right now, can not use dilt, BB, or dig Rates currently 90's-110's   4. Belly pain     Diffuse, nontender to light palpations     Predates his admission     Reports normal BM today     + BS     Suspect CHF    For questions or updates, please contact [facility] Please consult [email or web address] for contact info under     Signed, Baldwin Jamaica, PA-C  06/24/2019 11:15 AM  I have seen and examined this patient with Tommye Standard.  Agree with above, note added to reflect my findings.  On exam, tachycardic, irregular, 8cm JVD.  Patient admitted to the hospital after his  cholecystostomy drain was not functioning.  He was found to be in heart failure with rapid atrial fibrillation.  He was put on amiodarone drip, though he recently had a TEE that showed a left atrial appendage thrombus.  On exam, he is certainly volume overloaded with evidence of JVD and abdominal swelling.  Unfortunately his creatinine is significantly elevated at 2.7 up from a baseline of 1.1.  He Shafer Swamy likely need diuresis which may help to improve his creatinine.  He Henrry Feil need rate control, though with his left atrial appendage thrombus, amiodarone is not the best option.  He also has heart failure and thus diltiazem is not a good option either.  With his renal function, digoxin is also not an option.  If his blood pressure improves, would plan for metoprolol.  We Ulrich Soules work on diuresis with 40 mg twice daily Lasix.  We Travor Royce also  have the heart failure team weigh in on his potential low output heart failure.  Samanatha Brammer M. Anusha Claus MD 06/24/2019 12:47 PM

## 2019-06-24 NOTE — Progress Notes (Signed)
Pt returned to unit from IR. Vital signs taken.

## 2019-06-24 NOTE — Consult Note (Addendum)
Advanced Heart Failure Team Consult Note   Primary Physician: [name], MD PCP-Cardiologist:  Glenn Axe, MD  Reason for Consultation: Acute on chronic systolic heart failure   HPI:    Glenn [name] is seen today for evaluation of acute on chronic systolic heart failure at the request of Dr. Curt Bears, Electrophysiology.   69 y/o AAM w/ chronic systolic HF 2/2 NICM s/p ICD, atrial fibrillation, h/o stroke (Lg L MCA infarct w/ hemorrhagic conversion in 2018), h/o DVT, chornic a/c therapy, HTN, HLD, pulmonary nodule, Hepatitis C, carpal tunnel,  tobacco abuse (smokes ~1/4 ppd) and chronic cholecystitis with percutaneous chelcystotomy drain in place.  Also w/ reported poor med compliance. Lives at home w/ his son. He is retired.   HF dates back to 2015. First diagnosed when he was living in [place]. ICD was placed there. Moved to [place] in 2018 and he established care w/ Dr. [name]. Echo done through [facility] in 2018 showed EF at 30-35%. RV systolic function mildly reduced. Moderate AI also noted.   He was admitted 02/9620 for a/c systolic HF and atrial fibrillation. Echo showed further reduction in LVEF to 15-20% w/ moderate concentric LVH. RV systolic function moderately reduced. R/LHC showed normal coronary arteries, elevated filling pressures and low CI at 1.93 L/min/m2, but it appears he was not treated w/ inotropes. He was diuresed and placed on GDMT. He was started on Entresto, spiro and coreg. Also required DCCV for afib during that admit.    Had recent ED visit 05/2019 for acute CODPE , a/c CHF and recurrent afib. His BSCi device was interrogated and showed evidence of mild volume overload. He was seen by Dr. Lovena Le who recommended a dose of IV Lasix in the ED, followed by discharge home w/ plans to set up for outpatient cardioversion.    Had planned TEE/DCCV on 4/20, however DCCV was canceled after TEE confirmed two separate, well organized thrombi seen in LAA. He had f/u  w/ Dr. [name] on 5/3. Decided to continue w/ rate control. He noted possibility of future AV junction ablation but no definitive plans.   Pt presented to ED on 5/4, mainly for perc cholecystotomy tube dysfunction but also noted increased SOB. It appears his tube got caught in his bed two days ago and his wife apparently cut it loose.   In ED, SCr was elevated above baseline at 1.68 (baseline ~1.2). BNP 613. CXR w/ vascular congestion and interstitial edema. EKG showed rapid afib 144 bpm. Admitted by IM. Lasix held due to low BP. HF meds held due to AKI. Initially placed on Cardizem drip but transitioned to IV amiodarone. EP has seen today and has stopped amiodarone given concerns for chemical conversion and risk of embolism w/ known LAA thrombi. He is on IV heparin. INR is 2.0.   IR was consulted and replaced perc cholecystotomy tube.   AHF now consulted to assist w/ further management of HF. He has had progressive rise in SCr 1.68>>1.75>>2.78 concerning for low output/ cardiorenal syndrome. UOP not charted but urine in purewick canister is tea colored. JVD to ear. In Afib, rates in the 110s-120s. 19 beat run of VT captured on tele. W/ NYHA Class IIIb symptoms and 3 pillow orthopnea.    2D Echo 01/2019  Left ventricular ejection fraction, by visual estimation, is 15-20%. The left ventricle has severely decreased function. There is no left ventricular hypertrophy. 2. Mildly dilated left ventricular internal cavity size. 3. The left ventricle demonstrates global hypokinesis.  4. Global right ventricle has moderately reduced systolic function.The right ventricular size is normal. No increase in right ventricular wall thickness. 5. Left atrial size was normal. 6. Moderate sized left atrial thrombus. 7. LA appendage thrombus 1.24 cm x 0.75 cm. Smoke noted in the LA appendage. 8. Right atrial size was normal. 9. The mitral valve is normal in structure. Mild mitral valve regurgitation. No evidence of  mitral stenosis. 10. The tricuspid valve is normal in structure. Tricuspid valve regurgitation moderate-severe. 11. Aortic valve regurgitation is moderate. 12. The aortic valve is normal in structure. Aortic valve regurgitation is moderate. No evidence of aortic valve sclerosis or stenosis. 13. The pulmonic valve was normal in structure. Pulmonic valve regurgitation is mild. 14. Minimal plaque invoving the descending aorta. 15. Moderately elevated pulmonary artery systolic pressure. 16. A pacer wire is visualized. 17. The inferior vena cava is normal in size with greater than 50% respiratory variability, suggesting right atrial pressure of 3 mmHg.  Review of Systems: [y] = yes, [ ]  = no   . General: Weight gain [ ] ; Weight loss [ ] ; Anorexia [ ] ; Fatigue [ ] ; Fever [ ] ; Chills [ ] ; Weakness [ ]   . Cardiac: Chest pain/pressure [ ] ; Resting SOB [ ] ; Exertional SOB [ ] ; Orthopnea [ ] ; Pedal Edema [ ] ; Palpitations [ ] ; Syncope [ ] ; Presyncope [ ] ; Paroxysmal nocturnal dyspnea[ ]   . Pulmonary: Cough [ ] ; Wheezing[ ] ; Hemoptysis[ ] ; Sputum [ ] ; Snoring [ ]   . GI: Vomiting[ ] ; Dysphagia[ ] ; Melena[ ] ; Hematochezia [ ] ; Heartburn[ ] ; Abdominal pain [ ] ; Constipation [ ] ; Diarrhea [ ] ; BRBPR [ ]   . GU: Hematuria[ ] ; Dysuria [ ] ; Nocturia[ ]   . Vascular: Pain in legs with walking [ ] ; Pain in feet with lying flat [ ] ; Non-healing sores [ ] ; Stroke [ ] ; TIA [ ] ; Slurred speech [ ] ;  . Neuro: Headaches[ ] ; Vertigo[ ] ; Seizures[ ] ; Paresthesias[ ] ;Blurred vision [ ] ; Diplopia [ ] ; Vision changes [ ]   . Ortho/Skin: Arthritis [ ] ; Joint pain [ ] ; Muscle pain [ ] ; Joint swelling [ ] ; Back Pain [ ] ; Rash [ ]   . Psych: Depression[ ] ; Anxiety[ ]   . Heme: Bleeding problems [ ] ; Clotting disorders [ ] ; Anemia [ ]   . Endocrine: Diabetes [ ] ; Thyroid dysfunction[ ]   Home Medications Prior to Admission medications   Medication Sig Start Date End Date Taking? Authorizing Provider  acetaminophen (TYLENOL) 325  MG tablet Take 2 tablets (650 mg total) by mouth every 4 (four) hours as needed for mild pain (or temp > 37.5 C (99.5 F)). 12/02/18  Yes Angiulli, Lavon Paganini, PA-C  albuterol (VENTOLIN HFA) 108 (90 Base) MCG/ACT inhaler Inhale 2 puffs into the lungs every 4 (four) hours as needed for wheezing or shortness of breath.  06/12/19  Yes [provider]  atorvastatin (LIPITOR) 20 MG tablet TAKE 1 TABLET(20 MG) BY MOUTH DAILY AT 6 PM Patient taking differently: Take 20 mg by mouth every evening.  03/13/19  Yes [name], MD  calcium carbonate (TUMS - DOSED IN MG ELEMENTAL CALCIUM) 500 MG chewable tablet Chew 1 tablet (200 mg of elemental calcium total) by mouth daily as needed for indigestion or heartburn. 03/12/19  Yes [name], MD  dabigatran ([name]) 150 MG CAPS capsule Take 1 capsule (150 mg total) by mouth 2 (two) times daily. 06/16/19  Yes Sherran Needs, NP  digoxin (LANOXIN) 0.125 MG tablet Take 1 tablet (0.125 mg total) by  mouth daily. 05/12/19  Yes Baldwin Jamaica, PA-C  furosemide (LASIX) 40 MG tablet TAKE 1 TABLET BY MOUTH DAILY WITH ONE EXTRA TABLET AS NEEDED FOR FLUID Patient taking differently: Take 40-80 mg by mouth See admin instructions. TAKE 1 TABLET BY MOUTH DAILY WITH ONE EXTRA TABLET AS NEEDED FOR FLUID 06/16/19  Yes Sherran Needs, NP  ipratropium (ATROVENT HFA) 17 MCG/ACT inhaler Inhale 2 puffs into the lungs every 6 (six) hours as needed for wheezing. 03/16/19  Yes [name], MD  metoprolol succinate (TOPROL-XL) 100 MG 24 hr tablet Take one tablet by mouth daily Patient taking differently: Take 100 mg by mouth in the morning.  06/16/19  Yes Sherran Needs, NP  pantoprazole (PROTONIX) 40 MG tablet TAKE 1 TABLET(40 MG) BY MOUTH DAILY Patient taking differently: Take 40 mg by mouth daily.  03/13/19  Yes [name], MD  potassium chloride SA (KLOR-CON) 20 MEQ tablet TAKE 1 TABLET(20 MEQ) BY MOUTH DAILY Patient taking differently: Take 20 mEq by mouth daily.   04/20/19  Yes [name], MD  sacubitril-valsartan (ENTRESTO) 24-26 MG Take 1 tablet by mouth 2 (two) times daily. 03/12/19  Yes [name], MD  spironolactone (ALDACTONE) 25 MG tablet Take 1 tablet (25 mg total) by mouth daily. 06/16/19  Yes Sherran Needs, NP  traMADol (ULTRAM) 50 MG tablet Take 1 tablet (50 mg total) by mouth every 8 (eight) hours as needed for severe pain. 03/12/19  Yes [name], MD    Past Medical History: Past Medical History:  Diagnosis Date  . Acute blood loss anemia   . Acute CVA (cerebrovascular accident) ([place])   . Acute deep vein thrombosis (DVT) of both lower extremities ([name])   . Acute kidney injury ([place])   . Acute on chronic combined systolic and diastolic CHF (congestive heart failure) ([place])   . Acute renal failure superimposed on stage 3a chronic kidney disease ([place])   . Acute respiratory failure with hypoxia ([place])   . Atrial fibrillation ([place])   . Carpal tunnel syndrome of right wrist 02/28/2018  . Cerebral edema ([place]) 11/13/2018  . Cerebral infarction ([place])   . CHF (congestive heart failure) ([place])   . Cholecystitis 02/04/2019  . Chronic right hip pain   . DCM (dilated cardiomyopathy) ([place])   . Dysphagia, post-stroke   . Elevated troponin   . Entrapment of right ulnar nerve 02/28/2018  . Headache due to intracranial disease 11/14/2018  . Hepatitis C   . History of hemorrhagic stroke with residual hemiparesis ([place]) 02/04/2019  . HTN (hypertension) 08/14/2016  . Hyperlipidemia LDL goal <70 11/13/2018  . Hypertension   . ICD (implantable cardioverter-defibrillator) in place 09/13/2016  . Impotence due to erectile dysfunction 09/30/2017  . Ischemic cardiomyopathy   . Labile blood glucose   . Left leg DVT ([place]) 02/04/2019  . Marijuana user 11/13/2018  . Paroxysmal atrial fibrillation ([name])   . Right middle cerebral artery stroke ([place]) 11/13/2018  . Solitary pulmonary nodule 06/10/2017   5 mm RUL nodule noted incidentally as part of CVA workup  08/2016. With smoking history would obtain low-dose CT scan 08/2017.   . Stroke (cerebrum) ([name]) Lg L MCA infarct w/ hemorrhagic conversion, embolic d/t AF 5/85/2778  . Stroke ([employer])   . Trochanteric bursitis, right hip 11/14/2018  . Visit for monitoring Tikosyn therapy 03/26/2017    Past Surgical History: Past Surgical History:  Procedure Laterality Date  . CARDIAC DEFIBRILLATOR PLACEMENT  2015  . CARDIOVERSION N/A 10/10/2016   Procedure:  CARDIOVERSION;  Surgeon: Dorothy Spark, MD;  Location: [facility];  Service: Cardiovascular;  Laterality: N/A;  . CARDIOVERSION N/A 03/27/2017   Procedure: CARDIOVERSION;  Surgeon: Jerline Pain, MD;  Location: [facility] ENDOSCOPY;  Service: Cardiovascular;  Laterality: N/A;  . CARDIOVERSION N/A 10/29/2018   Procedure: CARDIOVERSION;  Surgeon: Sanda Klein, MD;  Location: [place] [facility] ENDOSCOPY;  Service: Cardiovascular;  Laterality: N/A;  . CARDIOVERSION N/A 11/05/2018   Procedure: CARDIOVERSION;  Surgeon: Acie Fredrickson Wonda Cheng, MD;  Location: [facility];  Service: Cardiovascular;  Laterality: N/A;  . EYE SURGERY Left 1990  . IR CATHETER TUBE CHANGE  06/24/2019  . IR EXCHANGE BILIARY DRAIN  02/11/2019  . IR EXCHANGE BILIARY DRAIN  06/09/2019  . IR IVC FILTER PLMT / S&I /IMG GUID/MOD SED  12/04/2018  . IR PERC CHOLECYSTOSTOMY  12/13/2018  . IR PERCUTANEOUS ART THROMBECTOMY/INFUSION INTRACRANIAL INC DIAG ANGIO  09/05/2016  . IR RADIOLOGIST EVAL & MGMT  10/03/2016  . RADIOLOGY WITH ANESTHESIA N/A 09/05/2016   Procedure: RADIOLOGY WITH ANESTHESIA;  Surgeon: Luanne Bras, MD;  Location: [facility];  Service: Radiology;  Laterality: N/A;  . RIGHT/LEFT HEART CATH AND CORONARY ANGIOGRAPHY N/A 11/03/2018   Procedure: RIGHT/LEFT HEART CATH AND CORONARY ANGIOGRAPHY;  Surgeon: Lorretta Harp, MD;  Location: [facility] CV LAB;  Service: Cardiovascular;  Laterality: N/A;  . TEE WITHOUT CARDIOVERSION N/A 02/06/2019   Procedure: TRANSESOPHAGEAL ECHOCARDIOGRAM (TEE);  Surgeon:  Skeet Latch, MD;  Location: [place];  Service: Cardiovascular;  Laterality: N/A;  . TEE WITHOUT CARDIOVERSION N/A 06/09/2019   Procedure: TRANSESOPHAGEAL ECHOCARDIOGRAM (TEE);  Surgeon: Buford Dresser, MD;  Location: [facility] ENDOSCOPY;  Service: Cardiovascular;  Laterality: N/A;    Family History: Family History  Problem Relation Age of Onset  . High blood pressure Mother   . High blood pressure Father   . Stroke Maternal Aunt   . Heart disease Neg Hx     Social History: Social History   Socioeconomic History  . Marital status: Single    Spouse name: Not on file  . Number of children: Not on file  . Years of education: 94 (some college)  . Highest education level: Not on file  Occupational History  . Occupation: disability  Tobacco Use  . Smoking status: Former Smoker    Packs/day: 0.50    Types: Cigarettes  . Smokeless tobacco: Never Used  . Tobacco comment: a pack last three days  Substance and Sexual Activity  . Alcohol use: Not Currently    Alcohol/week: 3.0 standard drinks    Types: 3 Cans of beer per week    Comment: pt stop drinking   . Drug use: Not Currently    Frequency: 2.0 times per week    Types: Marijuana    Comment: stop smoking   . Sexual activity: Yes    Partners: Female    Birth control/protection: Condom  Other Topics Concern  . Not on file  Social History Narrative  . Not on file   Social Determinants of Health   Financial Resource Strain:   . Difficulty of Paying Living Expenses:   Food Insecurity:   . Worried About Charity fundraiser in the Last Year:   . Arboriculturist in the Last Year:   Transportation Needs:   . Film/video editor (Medical):   Marland Kitchen Lack of Transportation (Non-Medical):   Physical Activity:   . Days of Exercise per Week:   . Minutes of Exercise per Session:   Stress:   .  Feeling of Stress :   Social Connections:   . Frequency of Communication with Friends and Family:   . Frequency of Social  Gatherings with Friends and Family:   . Attends Religious Services:   . Active Member of Clubs or Organizations:   . Attends Archivist Meetings:   Marland Kitchen Marital Status:     Allergies:  Allergies  Allergen Reactions  . Benadryl [Diphenhydramine] Palpitations    Objective:    Vital Signs:   Temp:  [97.7 F (36.5 C)-98.4 F (36.9 C)] 97.7 F (36.5 C) (05/05 1030) Pulse Rate:  [37-143] 89 (05/05 1030) Resp:  [10-50] 20 (05/05 1030) BP: (94-133)/(65-118) 110/80 (05/05 1030) SpO2:  [94 %-100 %] 98 % (05/05 1030) Weight:  [75.9 kg-76.2 kg] 76.2 kg (05/05 0415) Last BM Date: 06/23/19  Weight change: Filed Weights   06/23/19 1101 06/23/19 1859 06/24/19 0415  Weight: 79.4 kg 75.9 kg 76.2 kg    Intake/Output:   Intake/Output Summary (Last 24 hours) at 06/24/2019 1256 Last data filed at 06/23/2019 2003 Gross per 24 hour  Intake 255.32 ml  Output --  Net 255.32 ml      Physical Exam    General:  fatigue appearing/ thin AAM. No resp difficulty HEENT: normal Neck: supple. JVP elevated to ear . Carotids 2+ bilat; no bruits. No lymphadenopathy or thyromegaly appreciated. Cor: PMI nondisplaced. Irregularly irregular rhythm, tachy rate. No rubs, gallops or murmurs. Lungs: decreased BS at the bases  Abdomen: soft, nontender, nondistended. No hepatosplenomegaly. No bruits or masses. Good bowel sounds. + RUQ perc cholecystotomy tube Extremities: no cyanosis, clubbing, rash, trace bilateral LE edema. Legs are warm  Neuro: alert & orientedx3, cranial nerves grossly intact. moves all 4 extremities w/o difficulty. Affect pleasant   Telemetry   Rapid afib, 110s-120s, 19 beat run of VT on tele   EKG    Admit EKG showed afib 144 bpm   Labs   Basic Metabolic Panel: Recent Labs  Lab 06/22/19 1555 06/23/19 1235 06/24/19 0526  NA 138 138 137  K 4.2 4.7 4.6  CL 106 105 107  CO2 22 22 20*  GLUCOSE 102* 118* 115*  BUN 16 19 25*  CREATININE 1.68* 1.75* 2.78*  CALCIUM 9.5  9.4 9.2    Liver Function Tests: Recent Labs  Lab 06/22/19 1555 06/23/19 1235 06/24/19 0526  AST 21 21 62*  ALT 17 15 32  ALKPHOS 77 77 75  BILITOT 1.5* 1.6* 1.1  PROT 7.6 6.9 6.9  ALBUMIN 4.2 3.8 3.8   Recent Labs  Lab 06/22/19 1555 06/23/19 1235  LIPASE 29 24   No results for input(s): AMMONIA in the last 168 hours.  CBC: Recent Labs  Lab 06/22/19 1555 06/23/19 1235  WBC 3.5* 5.2  HGB 11.7* 11.4*  HCT 36.9* 36.5*  MCV 95.6 96.6  PLT 180 179    Cardiac Enzymes: No results for input(s): CKTOTAL, CKMB, CKMBINDEX, TROPONINI in the last 168 hours.  BNP: BNP (last 3 results) Recent Labs    02/03/19 2007 06/01/19 1413 06/23/19 1527  BNP 922.2* 531.4* 613.2*    ProBNP (last 3 results) No results for input(s): PROBNP in the last 8760 hours.   CBG: No results for input(s): GLUCAP in the last 168 hours.  Coagulation Studies: Recent Labs    06/23/19 1235 06/24/19 0526  LABPROT 22.3* 22.1*  INR 2.0* 2.0*     Imaging   IR Catheter Tube Change  Result Date: 06/24/2019 CLINICAL DATA:  The external portion of  previously placed cholecystostomy catheter was inadvertently cut in half EXAM: [place]:  1.3 minutes; 37  uGym2 DAP TECHNIQUE: The cholecystostomy catheter fragment and surrounding skin were prepped with Betadine, draped in usual sterile fashion. Subcutaneous tissues infiltrated with 1% lidocaine. A small amount of contrast was injected through the nephrostomy catheter showing small contracted gallbladder with stones. No free intraperitoneal spread. Contrast leaks back along the catheter tract. The catheter was exchanged over a 0.035" angiographic wire for a 5 [place] Kumpe catheter and additional injection performed confirming the above findings. The catheter was then exchanged over the wire for a new 10-French pigtail catheter, formed centrally within the contracted gallbladder lumen  under fluoroscopy. Contrast injection confirms appropriate positioning. The catheter was secured externally with a Statlock devices and 0 Prolene suture. The patient tolerated the procedure well. COMPLICATIONS: None immediate IMPRESSION: 1. Technically successful exchange of cholecystostomy catheter under fluoroscopy Electronically Signed   By: Lucrezia Europe M.D.   On: 06/24/2019 12:16   DG Chest Port 1 View  Result Date: 06/23/2019 CLINICAL DATA:  Acute shortness of breath EXAM: PORTABLE CHEST 1 VIEW COMPARISON:  06/23/2019 FINDINGS: Single frontal view of the chest demonstrates interval placement of external defibrillator pads. Single lead AICD unchanged. Cardiac silhouette remains enlarged. There is continued vascular congestion with mild interstitial edema. No airspace disease, effusion, or pneumothorax. There is chronic right basilar scarring/atelectasis versus pleural thickening. No acute bony abnormalities. IMPRESSION: 1. Stable vascular congestion and interstitial edema. Electronically Signed   By: Randa Ngo M.D.   On: 06/23/2019 15:14      Medications:     Current Medications: . atorvastatin  20 mg Oral Daily  . furosemide  40 mg Intravenous Q12H  . ipratropium  2.5 mL Inhalation BID  . lidocaine      . pantoprazole  40 mg Oral Daily  . sodium chloride flush  3 mL Intravenous Once     Infusions: . heparin        Assessment/Plan   1. Acute on Chronic Combined Systolic and Diastolic Heart Failure/ W/ Probable Low Output: - 2/2 NICM. HF dates back to 2015. Diagnosed in [place]. ICD placed there - Most recent echo 4/21 w/ EF <20. RV mildly reduced. GIIIDD. Moderate TR. No LVH.  - [facility] 10/2018 showed normal cors, elevated filling pressures and low CI at 1.9 (not treated w/ inotropes, no AHF consultation that admit) - Prescribed GDMT w/ questionable med compliance  - Now admitted w/ NYHA IIIb symptoms, volume overload and worsening renal function, likely cardiorenal syndrome. SCr  now 2.78 (prior baseline 1.2). Cuff MAPs ok in the 80s.  - unable to place PICC line w/ SCr >2.0.  - Will arrange RHC w/ plans to place central line today, to reassess filling pressures, CO/CI. Ideally, would use inotropic support to help augment output and diuresis, however this will be very challenging given his persistent rapid afib and inability to use IV amiodarone currently, in fear of chemically converting, in the setting of known LAA thrombi found on recent TEE 06/09/19.   - ? temporary mechanical support, given limited medical options. May need impella placement   - poor candidate for long-term advanced therapies given h/o poor med compliance + tobacco abuse.  - will try to diurese, increase Lasix to 80 mg bid - would benefit from para medicine once discharged to help w/ med compliance  2. AKI - suspect likely cardiorenal - Scr 2.78 today. Baseline ~1.2 - as noted  above, may need temporary mechanical support, as use of inotropes may be limited  3. Persistent Atrial Fibrillation w/ RVR - Recent DCCV attempt aborted 4/20 due to LAA thrombi on TEE - current v-rates 110-120s w/ limited rate/ rhythm control options. - unable to use IV amiodarone due to risk of chemical conversion - unable to use IV Cardizem given severe LV dysfunction  - use of digoxin limited due to AKI w/ SCr >2 - will avoid  blocker currently given concerns for low outpatient and acute decompensation - suspect his Afib is worsening his HF - may need to consider AV node ablation. He has a functioning ICD. EP to weigh in - needs outpatient sleep study to r/o OSA - on IV heparin. [name] held for IR procedure (per chole drain replacement)   4. NSVT - 19 beat run on tele - in the setting of severe LV dysfunction - he has ICD - keep K >4.0 and Mg >2.0  - holding amiodarone and  blocker per above     Length of Stay: [place], PA-C  06/24/2019, 12:56 PM  Advanced Heart Failure Team Pager [phone]  (M-F; 7a - 4p)  Please contact [facility] for night-coverage after hours (4p -7a ) and weekends on [email or web address]  Agree with above  69 y/o male with h/o systolic HF due to NICM EF has ranged from 15-35%. Etiology unclear but suspected AF with RVR. (has h/o ETOH but none recently).    Hat TEE about 2 weeks ago LVEF 10%. RV moderately down. No DC-CV due to +LAA clot (not compliant with AC).   Now admitted with cardiogenic shock and AKI in setting of AF with RVR   On exam General:  Weak appearing. No resp difficulty HEENT: normal Neck: supple. JVP to ear Carotids 2+ bilat; no bruits. No lymphadenopathy or thryomegaly appreciated. Cor: PMI nondisplaced. Irr egular rate & rhythm. +s3 Lungs: clear Abdomen: soft, nontender, + distended. No hepatosplenomegaly. + perc drain  No bruits or masses. Good bowel sounds. Extremities: no cyanosis, clubbing, rash, edema Neuro: alert & orientedx3, cranial nerves grossly intact. moves all 4 extremities w/o difficulty. Affect pleasant  He is in cardiogenic shock with MSOF. Likely due to tachy-CM in setting of AV with RVR. Will take to cath lab for swan placement and possible mechanical vs inotropic support. Transfer to [place]. Will need to control AF or there is no chance he will recover so will restart amiodarone despite risk of chemical cardioversion and possible embolic event. AVN ablation and CRT-D may also be an option - will d/w EP.  May need to consider advanced therapies but has h/o compliance issues.   D/w possible mechanical support with Dr. Orvan Seen.   CRITICAL CARE Performed by: Glori Bickers  Total critical care time: 55 minutes  Critical care time was exclusive of separately billable procedures and treating other patients.  Critical care was necessary to treat or prevent imminent or life-threatening deterioration.  Critical care was time spent personally by me (independent of midlevel providers or residents) on the following activities:  development of treatment plan with patient and/or surrogate as well as nursing, discussions with consultants, evaluation of patient's response to treatment, examination of patient, obtaining history from patient or surrogate, ordering and performing treatments and interventions, ordering and review of laboratory studies, ordering and review of radiographic studies, pulse oximetry and re-evaluation of patient's condition.  Glori Bickers, MD  3:53 PM

## 2019-06-24 NOTE — Progress Notes (Signed)
ANTICOAGULATION CONSULT NOTE - Initial Consult  Pharmacy Consult for Heparin Indication: atrial fibrillation  Allergies  Allergen Reactions  . Benadryl [Diphenhydramine] Palpitations    Patient Measurements: Height: 5\' 7"  (170.2 cm) Weight: 76.2 kg (168 lb) IBW/kg (Calculated) : 66.1 Heparin Dosing Weight: 76.2 kg  Vital Signs: Temp: 97.7 F (36.5 C) (05/05 1030) Temp Source: Oral (05/05 1030) BP: 110/80 (05/05 1030) Pulse Rate: 89 (05/05 1030)  Labs: Recent Labs    06/22/19 1555 06/23/19 1235 06/24/19 0526  HGB 11.7* 11.4*  --   HCT 36.9* 36.5*  --   PLT 180 179  --   LABPROT  --  22.3* 22.1*  INR  --  2.0* 2.0*  CREATININE 1.68* 1.75* 2.78*    Estimated Creatinine Clearance: 24.1 mL/min (A) (by C-G formula based on SCr of 2.78 mg/dL (H)).   Medical History: Past Medical History:  Diagnosis Date  . Acute blood loss anemia   . Acute CVA (cerebrovascular accident) ([place])   . Acute deep vein thrombosis (DVT) of both lower extremities ([name])   . Acute kidney injury ([place])   . Acute on chronic combined systolic and diastolic CHF (congestive heart failure) ([place])   . Acute renal failure superimposed on stage 3a chronic kidney disease ([place])   . Acute respiratory failure with hypoxia ([place])   . Atrial fibrillation ([place])   . Carpal tunnel syndrome of right wrist 02/28/2018  . Cerebral edema ([place]) 11/13/2018  . Cerebral infarction ([place])   . CHF (congestive heart failure) ([place])   . Cholecystitis 02/04/2019  . Chronic right hip pain   . DCM (dilated cardiomyopathy) ([place])   . Dysphagia, post-stroke   . Elevated troponin   . Entrapment of right ulnar nerve 02/28/2018  . Headache due to intracranial disease 11/14/2018  . Hepatitis C   . History of hemorrhagic stroke with residual hemiparesis ([place]) 02/04/2019  . HTN (hypertension) 08/14/2016  . Hyperlipidemia LDL goal <70 11/13/2018  . Hypertension   . ICD (implantable cardioverter-defibrillator) in place 09/13/2016  .  Impotence due to erectile dysfunction 09/30/2017  . Ischemic cardiomyopathy   . Labile blood glucose   . Left leg DVT ([place]) 02/04/2019  . Marijuana user 11/13/2018  . Paroxysmal atrial fibrillation ([name])   . Right middle cerebral artery stroke ([place]) 11/13/2018  . Solitary pulmonary nodule 06/10/2017   5 mm RUL nodule noted incidentally as part of CVA workup 08/2016. With smoking history would obtain low-dose CT scan 08/2017.   . Stroke (cerebrum) ([name]) Lg L MCA infarct w/ hemorrhagic conversion, embolic d/t AF 06/02/2438  . Stroke ([place])   . Trochanteric bursitis, right hip 11/14/2018  . Visit for monitoring Tikosyn therapy 03/26/2017    Assessment: Pt is a 69 yo M with atrial fibrillation on dabigatran PTA. Pt reports last dose was the morning of 06/23/19. Patient had exchange of cholecystomy tubing earlier this AM. Pharmacy asked to switch to heparin and received OK from IR to start now. CBC stable, no overt bleeding noted. Scr elevated 2.78.  Goal of Therapy:  Heparin level 0.3-0.7 units/ml Monitor platelets by anticoagulation protocol: Yes   Plan:  Start heparin infusion at 1100 units/hr Check 8-hr HL Monitor daily HL, CBC, s/sx bleeding F/u transition back to oral anticoagulation  Richardine Service, PharmD PGY1 Pharmacy Resident Phone: [phone] 06/24/2019  12:52 PM  Please check [email or web address] for unit-specific pharmacy phone numbers.

## 2019-06-24 NOTE — Interval H&P Note (Signed)
History and Physical Interval Note:  06/24/2019 4:50 PM  Glenn [name]  has presented today for surgery, with the diagnosis of hf.  The various methods of treatment have been discussed with the patient and family. After consideration of risks, benefits and other options for treatment, the patient has consented to  Procedure(s): RIGHT HEART CATH (N/A) CENTRAL LINE INSERTION (N/A) as a surgical intervention.  The patient's history has been reviewed, patient examined, no change in status, stable for surgery.  I have reviewed the patient's chart and labs.  Questions were answered to the patient's satisfaction.     [name]

## 2019-06-24 NOTE — Progress Notes (Addendum)
   06/24/19 1030  Assess: MEWS Score  Temp 97.7 F (36.5 C)  BP 110/80  Pulse Rate 89  ECG Heart Rate (!) 108  Resp 20  Level of Consciousness Alert  SpO2 98 %  O2 Device Room Air  Assess: MEWS Score  MEWS Temp 0  MEWS Systolic 0  MEWS Pulse 1  MEWS RR 0  MEWS LOC 0  MEWS Score 1  MEWS Score Color Green   Pt returned from IR. Vital signs taken.

## 2019-06-24 NOTE — Progress Notes (Signed)
Family Medicine Teaching Service Daily Progress Note Intern Pager: [phone]  Patient name: Glenn [name] Medical record number: [id number] Date of birth: Jun 07, 1950 Age: 69 y.o. Gender: male  Primary Care Provider: [name], MD Consultants: Cardiology Code Status: Full code  Pt Overview and Major Events to Date:  5/4: Patient admitted for atrial fibrillation with RVR with max heart rate 147, biliary drain malfunction, diltiazem drip transitioned to amiodarone 5/5: IR to replace biliary drain, heart rate down into 90s  Assessment and Plan: Glenn [name] is a 69 y.o. male presenting with palpitations, SOB and biliary drain problems, found to be in Afib with RVR. PMH is significant for HTN,COPD, chronic cholecystitis with biliary drain (recently exchanged on 4.20/21),  chronic hepatitis C, MCA stroke, ICD, tobacco abuse, pAF([name]), dilated cardiomyopathy, HLD, combined HFrEF <20%, chronic hip pain, hx of DVT.    Persistent atrial fibrillation with RVR, improved  ICD Patient started on diltiazem drip initially in ED.  Consult with cardiology overnight recommended transition to amiodarone.  Patient now within 18-hour recommended phase at 30 mg/h for amiodarone.  EKG continues to show atrial fibrillation.  Review of telemetry strip shows 8 beats of V. tach overnight.  This morning heart rate is ranging between 105-111 during examination. Cardiac monitoring overnight notable for 8 beats of Ventricular tachycardia.  - Cardiac monitoring - Cardiology following - Follow-up cardiology recommendations  -stopping amiodarone drip, considering metoprolol   Acute hypoxic respiratory failure, improved  history of COPD, chronic Patient able to transition from 2 L nasal cannula to room air with oxygen saturations between 93 and 100% overnight.  Initial presentation with no wheezing, no history of coughing in this acute setting.  Of note, patient is not on home supplemental oxygen.  Home  medications include Aleve albuterol (patient reports not taking), Atrovent and albuterol.  This morning reports improved respiratory status.  -Continue Atrovent schedule -Consider restarting levo albuterol, listed as home medication  Biliary drain dysfunction T bili on admission was 1.6.  Patient reporting abdominal pain with nausea on admission.  Today reports continued abdominal pain and nausea. Patient with hx chronic cholecystitis which is indication for this drain, And the removal of the drain will likely precipitate cholecystitis so It has been exchanged frequently by Interventional Radiology. - IR will replace today - Consider discussion with surgery for long-term plan for this during  Acute kidney injury Creatinine 1.75 on admission with GFR 36>2.78.  Baseline creatinine appears to be 1.1 from chart review.  Home medications include Lasix, spironolactone and Entresto. These medications held overnight. - Monitor creatinine, today creatinine worsened to 2.78 today  - given improved respiratory status - Avoid nephrotoxic agents - Hold home Lasix and Entresto and spironolactone  HFrEF<20% EF Home meds include digoxin, metoprolol 100mg  daily, Entresto 24-26mg , spironolatone 25mg  daily. BNP on admission at 613, reportedly close to patient's baseline. Last echo from April, 20th  2021 with EF <20% via TEE. CXR shows cardiomegaly with vascular congestion and potential component of interstitial edema.  On exam,still with LE edema.  Lasix held overnight given concern for low blood pressures.  - Daily weights - We will monitor urine output once diuresis is appropriate  MCA Infarct 10/2018 Patient reported as having large right MCA infarct with hemorrhagic conversion likely secondary to embolism. He was treated with chemical thrombectomy at this time.   At baseline, appears to have residual left-sided weakness.   - prescribed [name] for Atrial fibrillation AC   Concern for depression  Anxiety  Patient reports feeling better today.  -Recommend PCP discussion for antidepression medication once heart rate stabilized -Monitor mood while hospitalized -valium 10mg  PRN, listed as home medication   COPD  Home medications include albuterol, ipratropium, levalbuterol. - We will hold albuterol for now in the setting of tachycardia - Continue albuterol once heart rate within normal - IS spirometry  - continue home atrovent nebulizer q 6 hours   GERD  Patient on home Protonix 40 mg daily.  - continue home protonix   HLD Home atorvastatin 20mg .  - Continue home atorvastatin  Chronic hip pain Home medication includes tramadol 50 mg every 8 hours at home. - continue home tramadol  FEN/GI: heart healthy diet; NPO at MN   Prophylaxis: [name] 150mg   FEN/GI: N.p.o., cardiac diet after IR procedure PPx: Patient on [name] 150 mg  Disposition: Discharge pending improved heart rate  Subjective:  Patient reports continued nausea and abdominal pain but state his anxiety and SOB have improved. He reports no longer experiencing palpitations.   Objective: Temp:  [97.7 F (36.5 C)-98.4 F (36.9 C)] 97.8 F (36.6 C) (05/05 0827) Pulse Rate:  [37-145] 52 (05/05 0026) Resp:  [10-50] 30 (05/05 0827) BP: (94-139)/(65-118) 119/70 (05/05 0827) SpO2:  [93 %-100 %] 100 % (05/05 0827) Weight:  [75.9 kg-79.4 kg] 76.2 kg (05/05 0415)  Physical Exam: General: Cachectic male appearing stated age lying in bed in no acute distress Cardiovascular: Irregularly irregular heart rhythm, bilateral radial pulses palpable  Respiratory: decreased breath sounds at lung base on right, no crackles or wheezing, on RA this AM  Abdomen: soft, tenderness in epigastric region, bowel sounds present Extremities: LLE and LUE edema in hand, improved from prior exam   Laboratory: Recent Labs  Lab 06/22/19 1555 06/23/19 1235  WBC 3.5* 5.2  HGB 11.7* 11.4*  HCT 36.9* 36.5*  PLT 180 179   Recent Labs   Lab 06/22/19 1555 06/23/19 1235 06/24/19 0526  NA 138 138 137  K 4.2 4.7 4.6  CL 106 105 107  CO2 22 22 20*  BUN 16 19 25*  CREATININE 1.68* 1.75* 2.78*  CALCIUM 9.5 9.4 9.2  PROT 7.6 6.9 6.9  BILITOT 1.5* 1.6* 1.1  ALKPHOS 77 77 75  ALT 17 15 32  AST 21 21 62*  GLUCOSE 102* 118* 115*     Imaging/Diagnostic Tests: DG Chest Port 1 View  Result Date: 06/23/2019 CLINICAL DATA:  Acute shortness of breath EXAM: PORTABLE CHEST 1 VIEW COMPARISON:  06/23/2019 FINDINGS: Single frontal view of the chest demonstrates interval placement of external defibrillator pads. Single lead AICD unchanged. Cardiac silhouette remains enlarged. There is continued vascular congestion with mild interstitial edema. No airspace disease, effusion, or pneumothorax. There is chronic right basilar scarring/atelectasis versus pleural thickening. No acute bony abnormalities. IMPRESSION: 1. Stable vascular congestion and interstitial edema. Electronically Signed   By: Randa Ngo M.D.   On: 06/23/2019 15:14   DG Chest Portable 1 View  Result Date: 06/23/2019 CLINICAL DATA:  Tachycardia.  Shortness of breath. EXAM: PORTABLE CHEST 1 VIEW COMPARISON:  06/01/2019 FINDINGS: 1145 hours. The cardio pericardial silhouette is enlarged. Leftward patient rotation. Similar appearance vascular congestion with possible interstitial edema. Right base atelectasis without substantial pleural effusion. Left pacer/AICD again noted. Telemetry leads overlie the chest. IMPRESSION: Cardiomegaly with vascular congestion and potential component of interstitial edema. Electronically Signed   By: Misty Stanley M.D.   On: 06/23/2019 11:57     [name], MD 06/24/2019, 10:29 AM PGY-1, [facility] FPTS Intern pager: [phone], text pages welcome

## 2019-06-24 NOTE — Progress Notes (Signed)
  Amiodarone Drug - Drug Interaction Consult Note  Recommendations: No medication changes needed at this time   Amiodarone is metabolized by the cytochrome P450 system and therefore has the potential to cause many drug interactions. Amiodarone has an average plasma half-life of 50 days (range 20 to 100 days).   There is potential for drug interactions to occur several weeks or months after stopping treatment and the onset of drug interactions may be slow after initiating amiodarone.   [x]  Statins: Increased risk of myopathy. Simvastatin- restrict dose to 20mg  daily. Other statins: counsel patients to report any muscle pain or weakness immediately. Current atorvastatin 20mg  qd   []  Anticoagulants: Amiodarone can increase anticoagulant effect. Consider warfarin dose reduction. Patients should be monitored closely and the dose of anticoagulant altered accordingly, remembering that amiodarone levels take several weeks to stabilize.  []  Antiepileptics: Amiodarone can increase plasma concentration of phenytoin, the dose should be reduced. Note that small changes in phenytoin dose can result in large changes in levels. Monitor patient and counsel on signs of toxicity.  []  Beta blockers: increased risk of bradycardia, AV block and myocardial depression. Sotalol - avoid concomitant use.  []   Calcium channel blockers (diltiazem and verapamil): increased risk of bradycardia, AV block and myocardial depression.  []   Cyclosporine: Amiodarone increases levels of cyclosporine. Reduced dose of cyclosporine is recommended.  []  Digoxin dose should be halved when amiodarone is started.  []  Diuretics: increased risk of cardiotoxicity if hypokalemia occurs.  []  Oral hypoglycemic agents (glyburide, glipizide, glimepiride): increased risk of hypoglycemia. Patient's glucose levels should be monitored closely when initiating amiodarone therapy.   []  Drugs that prolong the QT interval:  Torsades de pointes risk  may be increased with concurrent use - avoid if possible.  Monitor QTc, also keep magnesium/potassium WNL if concurrent therapy can't be avoided. Marland Kitchen Antibiotics: e.g. fluoroquinolones, erythromycin. . Antiarrhythmics: e.g. quinidine, procainamide, disopyramide, sotalol. . Antipsychotics: e.g. phenothiazines, haloperidol.  . Lithium, tricyclic antidepressants, and methadone.    Bonnita Nasuti Pharm.D. CPP, BCPS Clinical Pharmacist [phone] 06/24/2019 6:35 PM

## 2019-06-24 NOTE — H&P (View-Only) (Signed)
Advanced Heart Failure Team Consult Note   Primary Physician: [name], MD PCP-Cardiologist:  Virl Axe, MD  Reason for Consultation: Acute on chronic systolic heart failure   HPI:    Glenn [name] is seen today for evaluation of acute on chronic systolic heart failure at the request of Dr. Curt Bears, Electrophysiology.   69 y/o AAM w/ chronic systolic HF 2/2 NICM s/p ICD, atrial fibrillation, h/o stroke (Lg L MCA infarct w/ hemorrhagic conversion in 2018), h/o DVT, chornic a/c therapy, HTN, HLD, pulmonary nodule, Hepatitis C, carpal tunnel,  tobacco abuse (smokes ~1/4 ppd) and chronic cholecystitis with percutaneous chelcystotomy drain in place.  Also w/ reported poor med compliance. Lives at home w/ his son. He is retired.   HF dates back to 2015. First diagnosed when he was living in [place]. ICD was placed there. Moved to [place] in 2018 and he established care w/ Dr. [name]. Echo done through [facility] in 2018 showed EF at 30-35%. RV systolic function mildly reduced. Moderate AI also noted.   He was admitted 07/1441 for a/c systolic HF and atrial fibrillation. Echo showed further reduction in LVEF to 15-20% w/ moderate concentric LVH. RV systolic function moderately reduced. R/LHC showed normal coronary arteries, elevated filling pressures and low CI at 1.93 L/min/m2, but it appears he was not treated w/ inotropes. He was diuresed and placed on GDMT. He was started on Entresto, spiro and coreg. Also required DCCV for afib during that admit.    Had recent ED visit 05/2019 for acute CODPE , a/c CHF and recurrent afib. His BSCi device was interrogated and showed evidence of mild volume overload. He was seen by Dr. Lovena Le who recommended a dose of IV Lasix in the ED, followed by discharge home w/ plans to set up for outpatient cardioversion.    Had planned TEE/DCCV on 4/20, however DCCV was canceled after TEE confirmed two separate, well organized thrombi seen in LAA. He had f/u  w/ Dr. [name] on 5/3. Decided to continue w/ rate control. He noted possibility of future AV junction ablation but no definitive plans.   Pt presented to ED on 5/4, mainly for perc cholecystotomy tube dysfunction but also noted increased SOB. It appears his tube got caught in his bed two days ago and his wife apparently cut it loose.   In ED, SCr was elevated above baseline at 1.68 (baseline ~1.2). BNP 613. CXR w/ vascular congestion and interstitial edema. EKG showed rapid afib 144 bpm. Admitted by IM. Lasix held due to low BP. HF meds held due to AKI. Initially placed on Cardizem drip but transitioned to IV amiodarone. EP has seen today and has stopped amiodarone given concerns for chemical conversion and risk of embolism w/ known LAA thrombi. He is on IV heparin. INR is 2.0.   IR was consulted and replaced perc cholecystotomy tube.   AHF now consulted to assist w/ further management of HF. He has had progressive rise in SCr 1.68>>1.75>>2.78 concerning for low output/ cardiorenal syndrome. UOP not charted but urine in purewick canister is tea colored. JVD to ear. In Afib, rates in the 110s-120s. 19 beat run of VT captured on tele. W/ NYHA Class IIIb symptoms and 3 pillow orthopnea.    2D Echo 01/2019  Left ventricular ejection fraction, by visual estimation, is 15-20%. The left ventricle has severely decreased function. There is no left ventricular hypertrophy. 2. Mildly dilated left ventricular internal cavity size. 3. The left ventricle demonstrates global hypokinesis.  4. Global right ventricle has moderately reduced systolic function.The right ventricular size is normal. No increase in right ventricular wall thickness. 5. Left atrial size was normal. 6. Moderate sized left atrial thrombus. 7. LA appendage thrombus 1.24 cm x 0.75 cm. Smoke noted in the LA appendage. 8. Right atrial size was normal. 9. The mitral valve is normal in structure. Mild mitral valve regurgitation. No evidence of  mitral stenosis. 10. The tricuspid valve is normal in structure. Tricuspid valve regurgitation moderate-severe. 11. Aortic valve regurgitation is moderate. 12. The aortic valve is normal in structure. Aortic valve regurgitation is moderate. No evidence of aortic valve sclerosis or stenosis. 13. The pulmonic valve was normal in structure. Pulmonic valve regurgitation is mild. 14. Minimal plaque invoving the descending aorta. 15. Moderately elevated pulmonary artery systolic pressure. 16. A pacer wire is visualized. 17. The inferior vena cava is normal in size with greater than 50% respiratory variability, suggesting right atrial pressure of 3 mmHg.  Review of Systems: [y] = yes, [ ]  = no   . General: Weight gain [ ] ; Weight loss [ ] ; Anorexia [ ] ; Fatigue [ ] ; Fever [ ] ; Chills [ ] ; Weakness [ ]   . Cardiac: Chest pain/pressure [ ] ; Resting SOB [ ] ; Exertional SOB [ ] ; Orthopnea [ ] ; Pedal Edema [ ] ; Palpitations [ ] ; Syncope [ ] ; Presyncope [ ] ; Paroxysmal nocturnal dyspnea[ ]   . Pulmonary: Cough [ ] ; Wheezing[ ] ; Hemoptysis[ ] ; Sputum [ ] ; Snoring [ ]   . GI: Vomiting[ ] ; Dysphagia[ ] ; Melena[ ] ; Hematochezia [ ] ; Heartburn[ ] ; Abdominal pain [ ] ; Constipation [ ] ; Diarrhea [ ] ; BRBPR [ ]   . GU: Hematuria[ ] ; Dysuria [ ] ; Nocturia[ ]   . Vascular: Pain in legs with walking [ ] ; Pain in feet with lying flat [ ] ; Non-healing sores [ ] ; Stroke [ ] ; TIA [ ] ; Slurred speech [ ] ;  . Neuro: Headaches[ ] ; Vertigo[ ] ; Seizures[ ] ; Paresthesias[ ] ;Blurred vision [ ] ; Diplopia [ ] ; Vision changes [ ]   . Ortho/Skin: Arthritis [ ] ; Joint pain [ ] ; Muscle pain [ ] ; Joint swelling [ ] ; Back Pain [ ] ; Rash [ ]   . Psych: Depression[ ] ; Anxiety[ ]   . Heme: Bleeding problems [ ] ; Clotting disorders [ ] ; Anemia [ ]   . Endocrine: Diabetes [ ] ; Thyroid dysfunction[ ]   Home Medications Prior to Admission medications   Medication Sig Start Date End Date Taking? Authorizing Provider  acetaminophen (TYLENOL) 325  MG tablet Take 2 tablets (650 mg total) by mouth every 4 (four) hours as needed for mild pain (or temp > 37.5 C (99.5 F)). 12/02/18  Yes Angiulli, Lavon Paganini, PA-C  albuterol (VENTOLIN HFA) 108 (90 Base) MCG/ACT inhaler Inhale 2 puffs into the lungs every 4 (four) hours as needed for wheezing or shortness of breath.  06/12/19  Yes [provider]  atorvastatin (LIPITOR) 20 MG tablet TAKE 1 TABLET(20 MG) BY MOUTH DAILY AT 6 PM Patient taking differently: Take 20 mg by mouth every evening.  03/13/19  Yes [name], MD  calcium carbonate (TUMS - DOSED IN MG ELEMENTAL CALCIUM) 500 MG chewable tablet Chew 1 tablet (200 mg of elemental calcium total) by mouth daily as needed for indigestion or heartburn. 03/12/19  Yes [name], MD  dabigatran ([name]) 150 MG CAPS capsule Take 1 capsule (150 mg total) by mouth 2 (two) times daily. 06/16/19  Yes Sherran Needs, NP  digoxin (LANOXIN) 0.125 MG tablet Take 1 tablet (0.125 mg total) by  mouth daily. 05/12/19  Yes Baldwin Jamaica, PA-C  furosemide (LASIX) 40 MG tablet TAKE 1 TABLET BY MOUTH DAILY WITH ONE EXTRA TABLET AS NEEDED FOR FLUID Patient taking differently: Take 40-80 mg by mouth See admin instructions. TAKE 1 TABLET BY MOUTH DAILY WITH ONE EXTRA TABLET AS NEEDED FOR FLUID 06/16/19  Yes Sherran Needs, NP  ipratropium (ATROVENT HFA) 17 MCG/ACT inhaler Inhale 2 puffs into the lungs every 6 (six) hours as needed for wheezing. 03/16/19  Yes [name], MD  metoprolol succinate (TOPROL-XL) 100 MG 24 hr tablet Take one tablet by mouth daily Patient taking differently: Take 100 mg by mouth in the morning.  06/16/19  Yes Sherran Needs, NP  pantoprazole (PROTONIX) 40 MG tablet TAKE 1 TABLET(40 MG) BY MOUTH DAILY Patient taking differently: Take 40 mg by mouth daily.  03/13/19  Yes [name], MD  potassium chloride SA (KLOR-CON) 20 MEQ tablet TAKE 1 TABLET(20 MEQ) BY MOUTH DAILY Patient taking differently: Take 20 mEq by mouth daily.   04/20/19  Yes [name], MD  sacubitril-valsartan (ENTRESTO) 24-26 MG Take 1 tablet by mouth 2 (two) times daily. 03/12/19  Yes [name], MD  spironolactone (ALDACTONE) 25 MG tablet Take 1 tablet (25 mg total) by mouth daily. 06/16/19  Yes Sherran Needs, NP  traMADol (ULTRAM) 50 MG tablet Take 1 tablet (50 mg total) by mouth every 8 (eight) hours as needed for severe pain. 03/12/19  Yes [name], MD    Past Medical History: Past Medical History:  Diagnosis Date  . Acute blood loss anemia   . Acute CVA (cerebrovascular accident) ([place])   . Acute deep vein thrombosis (DVT) of both lower extremities ([name])   . Acute kidney injury ([place])   . Acute on chronic combined systolic and diastolic CHF (congestive heart failure) ([place])   . Acute renal failure superimposed on stage 3a chronic kidney disease ([place])   . Acute respiratory failure with hypoxia ([place])   . Atrial fibrillation ([place])   . Carpal tunnel syndrome of right wrist 02/28/2018  . Cerebral edema ([place]) 11/13/2018  . Cerebral infarction ([place])   . CHF (congestive heart failure) ([place])   . Cholecystitis 02/04/2019  . Chronic right hip pain   . DCM (dilated cardiomyopathy) ([place])   . Dysphagia, post-stroke   . Elevated troponin   . Entrapment of right ulnar nerve 02/28/2018  . Headache due to intracranial disease 11/14/2018  . Hepatitis C   . History of hemorrhagic stroke with residual hemiparesis ([place]) 02/04/2019  . HTN (hypertension) 08/14/2016  . Hyperlipidemia LDL goal <70 11/13/2018  . Hypertension   . ICD (implantable cardioverter-defibrillator) in place 09/13/2016  . Impotence due to erectile dysfunction 09/30/2017  . Ischemic cardiomyopathy   . Labile blood glucose   . Left leg DVT ([place]) 02/04/2019  . Marijuana user 11/13/2018  . Paroxysmal atrial fibrillation ([name])   . Right middle cerebral artery stroke ([place]) 11/13/2018  . Solitary pulmonary nodule 06/10/2017   5 mm RUL nodule noted incidentally as part of CVA workup  08/2016. With smoking history would obtain low-dose CT scan 08/2017.   . Stroke (cerebrum) ([name]) Lg L MCA infarct w/ hemorrhagic conversion, embolic d/t AF 5/00/9381  . Stroke ([place])   . Trochanteric bursitis, right hip 11/14/2018  . Visit for monitoring Tikosyn therapy 03/26/2017    Past Surgical History: Past Surgical History:  Procedure Laterality Date  . CARDIAC DEFIBRILLATOR PLACEMENT  2015  . CARDIOVERSION N/A 10/10/2016   Procedure:  CARDIOVERSION;  Surgeon: Dorothy Spark, MD;  Location: [facility];  Service: Cardiovascular;  Laterality: N/A;  . CARDIOVERSION N/A 03/27/2017   Procedure: CARDIOVERSION;  Surgeon: Jerline Pain, MD;  Location: [facility] ENDOSCOPY;  Service: Cardiovascular;  Laterality: N/A;  . CARDIOVERSION N/A 10/29/2018   Procedure: CARDIOVERSION;  Surgeon: Sanda Klein, MD;  Location: [facility] ENDOSCOPY;  Service: Cardiovascular;  Laterality: N/A;  . CARDIOVERSION N/A 11/05/2018   Procedure: CARDIOVERSION;  Surgeon: Acie Fredrickson Wonda Cheng, MD;  Location: [facility];  Service: Cardiovascular;  Laterality: N/A;  . EYE SURGERY Left 1990  . IR CATHETER TUBE CHANGE  06/24/2019  . IR EXCHANGE BILIARY DRAIN  02/11/2019  . IR EXCHANGE BILIARY DRAIN  06/09/2019  . IR IVC FILTER PLMT / S&I /IMG GUID/MOD SED  12/04/2018  . IR PERC CHOLECYSTOSTOMY  12/13/2018  . IR PERCUTANEOUS ART THROMBECTOMY/INFUSION INTRACRANIAL INC DIAG ANGIO  09/05/2016  . IR RADIOLOGIST EVAL & MGMT  10/03/2016  . RADIOLOGY WITH ANESTHESIA N/A 09/05/2016   Procedure: RADIOLOGY WITH ANESTHESIA;  Surgeon: Luanne Bras, MD;  Location: [facility];  Service: Radiology;  Laterality: N/A;  . RIGHT/LEFT HEART CATH AND CORONARY ANGIOGRAPHY N/A 11/03/2018   Procedure: RIGHT/LEFT HEART CATH AND CORONARY ANGIOGRAPHY;  Surgeon: Lorretta Harp, MD;  Location: [facility] CV LAB;  Service: Cardiovascular;  Laterality: N/A;  . TEE WITHOUT CARDIOVERSION N/A 02/06/2019   Procedure: TRANSESOPHAGEAL ECHOCARDIOGRAM (TEE);  Surgeon:  Skeet Latch, MD;  Location: [place] [facility];  Service: Cardiovascular;  Laterality: N/A;  . TEE WITHOUT CARDIOVERSION N/A 06/09/2019   Procedure: TRANSESOPHAGEAL ECHOCARDIOGRAM (TEE);  Surgeon: Buford Dresser, MD;  Location: [facility] ENDOSCOPY;  Service: Cardiovascular;  Laterality: N/A;    Family History: Family History  Problem Relation Age of Onset  . High blood pressure Mother   . High blood pressure Father   . Stroke Maternal Aunt   . Heart disease Neg Hx     Social History: Social History   Socioeconomic History  . Marital status: Single    Spouse name: Not on file  . Number of children: Not on file  . Years of education: 78 (some college)  . Highest education level: Not on file  Occupational History  . Occupation: disability  Tobacco Use  . Smoking status: Former Smoker    Packs/day: 0.50    Types: Cigarettes  . Smokeless tobacco: Never Used  . Tobacco comment: a pack last three days  Substance and Sexual Activity  . Alcohol use: Not Currently    Alcohol/week: 3.0 standard drinks    Types: 3 Cans of beer per week    Comment: pt stop drinking   . Drug use: Not Currently    Frequency: 2.0 times per week    Types: Marijuana    Comment: stop smoking   . Sexual activity: Yes    Partners: Female    Birth control/protection: Condom  Other Topics Concern  . Not on file  Social History Narrative  . Not on file   Social Determinants of Health   Financial Resource Strain:   . Difficulty of Paying Living Expenses:   Food Insecurity:   . Worried About Charity fundraiser in the Last Year:   . Arboriculturist in the Last Year:   Transportation Needs:   . Film/video editor (Medical):   Marland Kitchen Lack of Transportation (Non-Medical):   Physical Activity:   . Days of Exercise per Week:   . Minutes of Exercise per Session:   Stress:   .  Feeling of Stress :   Social Connections:   . Frequency of Communication with Friends and Family:   . Frequency of Social  Gatherings with Friends and Family:   . Attends Religious Services:   . Active Member of Clubs or Organizations:   . Attends Archivist Meetings:   Marland Kitchen Marital Status:     Allergies:  Allergies  Allergen Reactions  . Benadryl [Diphenhydramine] Palpitations    Objective:    Vital Signs:   Temp:  [97.7 F (36.5 C)-98.4 F (36.9 C)] 97.7 F (36.5 C) (05/05 1030) Pulse Rate:  [37-143] 89 (05/05 1030) Resp:  [10-50] 20 (05/05 1030) BP: (94-133)/(65-118) 110/80 (05/05 1030) SpO2:  [94 %-100 %] 98 % (05/05 1030) Weight:  [75.9 kg-76.2 kg] 76.2 kg (05/05 0415) Last BM Date: 06/23/19  Weight change: Filed Weights   06/23/19 1101 06/23/19 1859 06/24/19 0415  Weight: 79.4 kg 75.9 kg 76.2 kg    Intake/Output:   Intake/Output Summary (Last 24 hours) at 06/24/2019 1256 Last data filed at 06/23/2019 2003 Gross per 24 hour  Intake 255.32 ml  Output --  Net 255.32 ml      Physical Exam    General:  fatigue appearing/ thin AAM. No resp difficulty HEENT: normal Neck: supple. JVP elevated to ear . Carotids 2+ bilat; no bruits. No lymphadenopathy or thyromegaly appreciated. Cor: PMI nondisplaced. Irregularly irregular rhythm, tachy rate. No rubs, gallops or murmurs. Lungs: decreased BS at the bases  Abdomen: soft, nontender, nondistended. No hepatosplenomegaly. No bruits or masses. Good bowel sounds. + RUQ perc cholecystotomy tube Extremities: no cyanosis, clubbing, rash, trace bilateral LE edema. Legs are warm  Neuro: alert & orientedx3, cranial nerves grossly intact. moves all 4 extremities w/o difficulty. Affect pleasant   Telemetry   Rapid afib, 110s-120s, 19 beat run of VT on tele   EKG    Admit EKG showed afib 144 bpm   Labs   Basic Metabolic Panel: Recent Labs  Lab 06/22/19 1555 06/23/19 1235 06/24/19 0526  NA 138 138 137  K 4.2 4.7 4.6  CL 106 105 107  CO2 22 22 20*  GLUCOSE 102* 118* 115*  BUN 16 19 25*  CREATININE 1.68* 1.75* 2.78*  CALCIUM 9.5  9.4 9.2    Liver Function Tests: Recent Labs  Lab 06/22/19 1555 06/23/19 1235 06/24/19 0526  AST 21 21 62*  ALT 17 15 32  ALKPHOS 77 77 75  BILITOT 1.5* 1.6* 1.1  PROT 7.6 6.9 6.9  ALBUMIN 4.2 3.8 3.8   Recent Labs  Lab 06/22/19 1555 06/23/19 1235  LIPASE 29 24   No results for input(s): AMMONIA in the last 168 hours.  CBC: Recent Labs  Lab 06/22/19 1555 06/23/19 1235  WBC 3.5* 5.2  HGB 11.7* 11.4*  HCT 36.9* 36.5*  MCV 95.6 96.6  PLT 180 179    Cardiac Enzymes: No results for input(s): CKTOTAL, CKMB, CKMBINDEX, TROPONINI in the last 168 hours.  BNP: BNP (last 3 results) Recent Labs    02/03/19 2007 06/01/19 1413 06/23/19 1527  BNP 922.2* 531.4* 613.2*    ProBNP (last 3 results) No results for input(s): PROBNP in the last 8760 hours.   CBG: No results for input(s): GLUCAP in the last 168 hours.  Coagulation Studies: Recent Labs    06/23/19 1235 06/24/19 0526  LABPROT 22.3* 22.1*  INR 2.0* 2.0*     Imaging   IR Catheter Tube Change  Result Date: 06/24/2019 CLINICAL DATA:  The external portion of  previously placed cholecystostomy catheter was inadvertently cut in half EXAM: [place]:  1.3 minutes; 78  uGym2 DAP TECHNIQUE: The cholecystostomy catheter fragment and surrounding skin were prepped with Betadine, draped in usual sterile fashion. Subcutaneous tissues infiltrated with 1% lidocaine. A small amount of contrast was injected through the nephrostomy catheter showing small contracted gallbladder with stones. No free intraperitoneal spread. Contrast leaks back along the catheter tract. The catheter was exchanged over a 0.035" angiographic wire for a 5 [place] Kumpe catheter and additional injection performed confirming the above findings. The catheter was then exchanged over the wire for a new 10-French pigtail catheter, formed centrally within the contracted gallbladder lumen  under fluoroscopy. Contrast injection confirms appropriate positioning. The catheter was secured externally with a Statlock devices and 0 Prolene suture. The patient tolerated the procedure well. COMPLICATIONS: None immediate IMPRESSION: 1. Technically successful exchange of cholecystostomy catheter under fluoroscopy Electronically Signed   By: Lucrezia Europe M.D.   On: 06/24/2019 12:16   DG Chest Port 1 View  Result Date: 06/23/2019 CLINICAL DATA:  Acute shortness of breath EXAM: PORTABLE CHEST 1 VIEW COMPARISON:  06/23/2019 FINDINGS: Single frontal view of the chest demonstrates interval placement of external defibrillator pads. Single lead AICD unchanged. Cardiac silhouette remains enlarged. There is continued vascular congestion with mild interstitial edema. No airspace disease, effusion, or pneumothorax. There is chronic right basilar scarring/atelectasis versus pleural thickening. No acute bony abnormalities. IMPRESSION: 1. Stable vascular congestion and interstitial edema. Electronically Signed   By: Randa Ngo M.D.   On: 06/23/2019 15:14      Medications:     Current Medications: . atorvastatin  20 mg Oral Daily  . furosemide  40 mg Intravenous Q12H  . ipratropium  2.5 mL Inhalation BID  . lidocaine      . pantoprazole  40 mg Oral Daily  . sodium chloride flush  3 mL Intravenous Once     Infusions: . heparin        Assessment/Plan   1. Acute on Chronic Combined Systolic and Diastolic Heart Failure/ W/ Probable Low Output: - 2/2 NICM. HF dates back to 2015. Diagnosed in [place]. ICD placed there - Most recent echo 4/21 w/ EF <20. RV mildly reduced. GIIIDD. Moderate TR. No LVH.  - [facility] 10/2018 showed normal cors, elevated filling pressures and low CI at 1.9 (not treated w/ inotropes, no AHF consultation that admit) - Prescribed GDMT w/ questionable med compliance  - Now admitted w/ NYHA IIIb symptoms, volume overload and worsening renal function, likely cardiorenal syndrome. SCr  now 2.78 (prior baseline 1.2). Cuff MAPs ok in the 80s.  - unable to place PICC line w/ SCr >2.0.  - Will arrange RHC w/ plans to place central line today, to reassess filling pressures, CO/CI. Ideally, would use inotropic support to help augment output and diuresis, however this will be very challenging given his persistent rapid afib and inability to use IV amiodarone currently, in fear of chemically converting, in the setting of known LAA thrombi found on recent TEE 06/09/19.   - ? temporary mechanical support, given limited medical options. May need impella placement   - poor candidate for long-term advanced therapies given h/o poor med compliance + tobacco abuse.  - will try to diurese, increase Lasix to 80 mg bid - would benefit from para medicine once discharged to help w/ med compliance  2. AKI - suspect likely cardiorenal - Scr 2.78 today. Baseline ~1.2 - as noted  above, may need temporary mechanical support, as use of inotropes may be limited  3. Persistent Atrial Fibrillation w/ RVR - Recent DCCV attempt aborted 4/20 due to LAA thrombi on TEE - current v-rates 110-120s w/ limited rate/ rhythm control options. - unable to use IV amiodarone due to risk of chemical conversion - unable to use IV Cardizem given severe LV dysfunction  - use of digoxin limited due to AKI w/ SCr >2 - will avoid  blocker currently given concerns for low outpatient and acute decompensation - suspect his Afib is worsening his HF - may need to consider AV node ablation. He has a functioning ICD. EP to weigh in - needs outpatient sleep study to r/o OSA - on IV heparin. [name] held for IR procedure (per chole drain replacement)   4. NSVT - 19 beat run on tele - in the setting of severe LV dysfunction - he has ICD - keep K >4.0 and Mg >2.0  - holding amiodarone and  blocker per above     Length of Stay: [place], PA-C  06/24/2019, 12:56 PM  Advanced Heart Failure Team Pager [phone]  (M-F; 7a - 4p)  Please contact [facility] for night-coverage after hours (4p -7a ) and weekends on [email or web address]  Agree with above  69 y/o male with h/o systolic HF due to NICM EF has ranged from 15-35%. Etiology unclear but suspected AF with RVR. (has h/o ETOH but none recently).    Hat TEE about 2 weeks ago LVEF 10%. RV moderately down. No DC-CV due to +LAA clot (not compliant with AC).   Now admitted with cardiogenic shock and AKI in setting of AF with RVR   On exam General:  Weak appearing. No resp difficulty HEENT: normal Neck: supple. JVP to ear Carotids 2+ bilat; no bruits. No lymphadenopathy or thryomegaly appreciated. Cor: PMI nondisplaced. Irr egular rate & rhythm. +s3 Lungs: clear Abdomen: soft, nontender, + distended. No hepatosplenomegaly. + perc drain  No bruits or masses. Good bowel sounds. Extremities: no cyanosis, clubbing, rash, edema Neuro: alert & orientedx3, cranial nerves grossly intact. moves all 4 extremities w/o difficulty. Affect pleasant  He is in cardiogenic shock with MSOF. Likely due to tachy-CM in setting of AV with RVR. Will take to cath lab for swan placement and possible mechanical vs inotropic support. Transfer to [place]. Will need to control AF or there is no chance he will recover so will restart amiodarone despite risk of chemical cardioversion and possible embolic event. AVN ablation and CRT-D may also be an option - will d/w EP.  May need to consider advanced therapies but has h/o compliance issues.   D/w possible mechanical support with Dr. Orvan Seen.   CRITICAL CARE Performed by: Glori Bickers  Total critical care time: 55 minutes  Critical care time was exclusive of separately billable procedures and treating other patients.  Critical care was necessary to treat or prevent imminent or life-threatening deterioration.  Critical care was time spent personally by me (independent of midlevel providers or residents) on the following activities:  development of treatment plan with patient and/or surrogate as well as nursing, discussions with consultants, evaluation of patient's response to treatment, examination of patient, obtaining history from patient or surrogate, ordering and performing treatments and interventions, ordering and review of laboratory studies, ordering and review of radiographic studies, pulse oximetry and re-evaluation of patient's condition.  Glori Bickers, MD  3:53 PM

## 2019-06-24 NOTE — Progress Notes (Addendum)
   06/24/19 0827  Assess: MEWS Score  Temp 97.8 F (36.6 C)  BP 119/70  ECG Heart Rate (!) 108  Resp (!) 30  SpO2 100 %  O2 Device Room Air  Assess: MEWS Score  MEWS Temp 0  MEWS Systolic 0  MEWS Pulse 1  MEWS RR 2  MEWS LOC 0  MEWS Score 3  MEWS Score Color Yellow  Assess: if the MEWS score is Yellow or Red  Were vital signs taken at a resting state? Yes (likely secondary to pain.)  Focused Assessment Documented focused assessment  Early Detection of Sepsis Score *See Row Information* Low  MEWS guidelines implemented *See Row Information* Yes  Treat  MEWS Interventions Escalated (See documentation below)  Take Vital Signs  Increase Vital Sign Frequency  Yellow: Q 2hr X 2 then Q 4hr X 2, if remains yellow, continue Q 4hrs  Escalate  MEWS: Escalate Yellow: discuss with charge nurse/RN and consider discussing with provider and RRT  Notify: Charge Nurse/RN  Name of Charge Nurse/RN Notified Carlean Crowl, RN  Date Charge Nurse/RN Notified 06/24/19  Time Charge Nurse/RN Notified 0830  Document  Patient Outcome Other (Comment)  Progress note created (see row info) Yes  Vital signs taken.

## 2019-06-25 ENCOUNTER — Inpatient Hospital Stay (HOSPITAL_COMMUNITY): Payer: Medicare (Managed Care)

## 2019-06-25 DIAGNOSIS — N170 Acute kidney failure with tubular necrosis: Secondary | ICD-10-CM | POA: Diagnosis not present

## 2019-06-25 DIAGNOSIS — R570 Cardiogenic shock: Secondary | ICD-10-CM | POA: Diagnosis not present

## 2019-06-25 DIAGNOSIS — I4891 Unspecified atrial fibrillation: Secondary | ICD-10-CM | POA: Diagnosis not present

## 2019-06-25 LAB — HEPARIN LEVEL (UNFRACTIONATED)
Heparin Unfractionated: <0.1 IU/mL — ABNORMAL LOW (ref 0.30–0.70)
Heparin Unfractionated: <0.1 IU/mL — ABNORMAL LOW (ref 0.30–0.70)

## 2019-06-25 LAB — CBC WITH DIFFERENTIAL/PLATELET
Abs Immature Granulocytes: 0.02 10*3/uL (ref 0.00–0.07)
Basophils Absolute: 0 10*3/uL (ref 0.0–0.1)
Basophils Relative: 0 %
Eosinophils Absolute: 0 10*3/uL (ref 0.0–0.5)
Eosinophils Relative: 0 %
HCT: 35 % — ABNORMAL LOW (ref 39.0–52.0)
Hemoglobin: 11.3 g/dL — ABNORMAL LOW (ref 13.0–17.0)
Immature Granulocytes: 0 %
Lymphocytes Relative: 12 %
Lymphs Abs: 0.9 10*3/uL (ref 0.7–4.0)
MCH: 30.6 pg (ref 26.0–34.0)
MCHC: 32.3 g/dL (ref 30.0–36.0)
MCV: 94.9 fL (ref 80.0–100.0)
Monocytes Absolute: 0.7 10*3/uL (ref 0.1–1.0)
Monocytes Relative: 10 %
Neutro Abs: 5.8 10*3/uL (ref 1.7–7.7)
Neutrophils Relative %: 78 %
Platelets: 200 10*3/uL (ref 150–400)
RBC: 3.69 MIL/uL — ABNORMAL LOW (ref 4.22–5.81)
RDW: 14.2 % (ref 11.5–15.5)
WBC: 7.4 10*3/uL (ref 4.0–10.5)
nRBC: 0 % (ref 0.0–0.2)

## 2019-06-25 LAB — COMPREHENSIVE METABOLIC PANEL
ALT: 41 U/L (ref 0–44)
AST: 55 U/L — ABNORMAL HIGH (ref 15–41)
Albumin: 3.3 g/dL — ABNORMAL LOW (ref 3.5–5.0)
Alkaline Phosphatase: 67 U/L (ref 38–126)
Anion gap: 11 (ref 5–15)
BUN: 32 mg/dL — ABNORMAL HIGH (ref 8–23)
CO2: 22 mmol/L (ref 22–32)
Calcium: 8.7 mg/dL — ABNORMAL LOW (ref 8.9–10.3)
Chloride: 105 mmol/L (ref 98–111)
Creatinine, Ser: 2.97 mg/dL — ABNORMAL HIGH (ref 0.61–1.24)
GFR calc Af Amer: 24 mL/min — ABNORMAL LOW (ref >60)
GFR calc non Af Amer: 21 mL/min — ABNORMAL LOW (ref >60)
Glucose, Bld: 127 mg/dL — ABNORMAL HIGH (ref 70–99)
Potassium: 3.7 mmol/L (ref 3.5–5.1)
Sodium: 138 mmol/L (ref 135–145)
Total Bilirubin: 1.1 mg/dL (ref 0.3–1.2)
Total Protein: 6.4 g/dL — ABNORMAL LOW (ref 6.5–8.1)

## 2019-06-25 LAB — COOXEMETRY PANEL
Carboxyhemoglobin: 1.4 % (ref 0.5–1.5)
Carboxyhemoglobin: 1.1 % (ref 0.5–1.5)
Methemoglobin: 0.4 % (ref 0.0–1.5)
Methemoglobin: 0.6 % (ref 0.0–1.5)
O2 Saturation: 65.1 %
O2 Saturation: 45.9 %
Total hemoglobin: 15.4 g/dL (ref 12.0–16.0)
Total hemoglobin: 13.5 g/dL (ref 12.0–16.0)

## 2019-06-25 LAB — BASIC METABOLIC PANEL
Anion gap: 12 (ref 5–15)
BUN: 32 mg/dL — ABNORMAL HIGH (ref 8–23)
CO2: 21 mmol/L — ABNORMAL LOW (ref 22–32)
Calcium: 8.8 mg/dL — ABNORMAL LOW (ref 8.9–10.3)
Chloride: 103 mmol/L (ref 98–111)
Creatinine, Ser: 2.52 mg/dL — ABNORMAL HIGH (ref 0.61–1.24)
GFR calc Af Amer: 29 mL/min — ABNORMAL LOW (ref >60)
GFR calc non Af Amer: 25 mL/min — ABNORMAL LOW (ref >60)
Glucose, Bld: 142 mg/dL — ABNORMAL HIGH (ref 70–99)
Potassium: 4.2 mmol/L (ref 3.5–5.1)
Sodium: 136 mmol/L (ref 135–145)

## 2019-06-25 LAB — MAGNESIUM: Magnesium: 1.7 mg/dL (ref 1.7–2.4)

## 2019-06-25 LAB — MRSA PCR SCREENING: MRSA by PCR: NEGATIVE

## 2019-06-25 LAB — PROTIME-INR
INR: 1.8 — ABNORMAL HIGH (ref 0.8–1.2)
Prothrombin Time: 20.2 seconds — ABNORMAL HIGH (ref 11.4–15.2)

## 2019-06-25 MED ORDER — MILRINONE LACTATE IN DEXTROSE 20-5 MG/100ML-% IV SOLN
0.1000 ug/kg/min | INTRAVENOUS | Status: DC
  Administered 2019-06-26 – 2019-07-01 (×4): 0.125 ug/kg/min via INTRAVENOUS
  Administered 2019-07-02 – 2019-07-03 (×3): 0.25 ug/kg/min via INTRAVENOUS
  Administered 2019-07-06: 0.125 ug/kg/min via INTRAVENOUS
  Administered 2019-07-06: 0.25 ug/kg/min via INTRAVENOUS
  Administered 2019-07-07: 0.2 ug/kg/min via INTRAVENOUS
  Administered 2019-07-08: 0.1 ug/kg/min via INTRAVENOUS
  Filled 2019-06-25 (×13): qty 100

## 2019-06-25 MED ORDER — POTASSIUM CHLORIDE CRYS ER 20 MEQ PO TBCR
40.0000 meq | EXTENDED_RELEASE_TABLET | Freq: Three times a day (TID) | ORAL | Status: AC
  Administered 2019-06-25 (×2): 40 meq via ORAL
  Filled 2019-06-25 (×2): qty 2

## 2019-06-25 MED ORDER — AMIODARONE IV BOLUS ONLY 150 MG/100ML
150.0000 mg | Freq: Once | INTRAVENOUS | Status: AC
  Administered 2019-06-25: 150 mg via INTRAVENOUS

## 2019-06-25 MED ORDER — SODIUM CHLORIDE 0.9% FLUSH
10.0000 mL | Freq: Two times a day (BID) | INTRAVENOUS | Status: DC
  Administered 2019-06-25: 10 mL
  Administered 2019-06-25: 40 mL
  Administered 2019-06-26: 10 mL

## 2019-06-25 MED ORDER — SODIUM CHLORIDE 0.9% FLUSH
10.0000 mL | INTRAVENOUS | Status: DC | PRN
  Administered 2019-06-30: 10 mL
  Administered 2019-06-30: 20 mL
  Administered 2019-07-02 – 2019-07-13 (×3): 10 mL

## 2019-06-25 MED ORDER — MAGNESIUM SULFATE 4 GM/100ML IV SOLN
4.0000 g | Freq: Once | INTRAVENOUS | Status: AC
  Administered 2019-06-25: 4 g via INTRAVENOUS
  Filled 2019-06-25: qty 100

## 2019-06-25 NOTE — Plan of Care (Signed)
  Problem: Education: Goal: Knowledge of General Education information will improve Description: Including pain rating scale, medication(s)/side effects and non-pharmacologic comfort measures Outcome: Progressing   Problem: Clinical Measurements: Goal: Respiratory complications will improve Outcome: Progressing Goal: Cardiovascular complication will be avoided Outcome: Progressing   Problem: Pain Managment: Goal: General experience of comfort will improve Outcome: Progressing   Problem: Safety: Goal: Ability to remain free from injury will improve Outcome: Progressing

## 2019-06-25 NOTE — Progress Notes (Signed)
FPTS Social Note    The FTPS appreciates cardiology's intensive care management for Glenn [name]. We will continue to watch this patient's status and resume the role of primary team once the patient is appropriate for the floor.   [name], MD  PGY-1, [place] [facility] Pager [phone]

## 2019-06-25 NOTE — Progress Notes (Signed)
   Called by RN because heart rate is trending up and he is now sustaining in the 120s.  We will rebolus him with amiodarone and increase the drip rate to 60 mg/h overnight.  Rosaria Ferries, PA-C 06/25/2019 6:32 PM

## 2019-06-25 NOTE — Progress Notes (Addendum)
Progress Note  Patient Name: Glenn [name] Date of Encounter: 06/25/2019  Primary Cardiologist: Virl Axe, MD   Subjective   Feeling a little better, nausea and shortness of breath  Coox 34>>51   AFib rates rapid   Inpatient Medications    Scheduled Meds: . atorvastatin  20 mg Oral Daily  . Chlorhexidine Gluconate Cloth  6 each Topical Daily  . furosemide  80 mg Intravenous BID  . ipratropium  2.5 mL Inhalation BID  . pantoprazole  40 mg Oral Daily  . potassium chloride  40 mEq Oral Q8H  . sodium chloride flush  10-40 mL Intracatheter Q12H  . sodium chloride flush  10-40 mL Intracatheter Q12H  . sodium chloride flush  3 mL Intravenous Once  . sodium chloride flush  3 mL Intravenous Q12H   Continuous Infusions: . amiodarone 30 mg/hr (06/25/19 0700)  . heparin 1,100 Units/hr (06/25/19 0700)  . milrinone 0.125 mcg/kg/min (06/25/19 0700)   PRN Meds: acetaminophen **OR** acetaminophen, calcium carbonate, lidocaine (PF), ondansetron **OR** ondansetron (ZOFRAN) IV, sodium chloride flush, sodium chloride flush, traMADol   Vital Signs    Vitals:   06/25/19 0518 06/25/19 0600 06/25/19 0700 06/25/19 0755  BP: 117/86 137/80 120/90   Pulse: 97 (!) 103 (!) 101   Resp:      Temp: 97.9 F (36.6 C) (!) 97.5 F (36.4 C) (!) 97.5 F (36.4 C)   TempSrc:      SpO2: (!) 89% 97% 95% 98%  Weight:      Height:        Intake/Output Summary (Last 24 hours) at 06/25/2019 0850 Last data filed at 06/25/2019 0700 Gross per 24 hour  Intake 889.96 ml  Output 1000 ml  Net -110.04 ml   Last 3 Weights 06/25/2019 06/24/2019 06/23/2019  Weight (lbs) 164 lb 14.5 oz 168 lb 167 lb 6.4 oz  Weight (kg) 74.8 kg 76.204 kg 75.932 kg      Telemetry    AFib 100-130s- Personally Reviewed  ECG    No new EKGs - Personally Reviewed  Physical Exam   GEN: No acute distress, looks ill, tired Neck: + JVD to jaw Cardiac: irreg-irreg, 1/6 SM, rubs, or gallops.  Respiratory: CTA b/l GI: Soft,  nontender, non-distended, RUQ drain dressed, clean MS: No edema; No deformity. Neuro:  Nonfocal  Psych: Normal affect   Labs    High Sensitivity Troponin:   Recent Labs  Lab 06/01/19 1413 06/01/19 1619  TROPONINIHS 75* 91*      Chemistry Recent Labs  Lab 06/23/19 1235 06/23/19 1235 06/24/19 0526 06/24/19 1712 06/25/19 0421  NA 138   < > 137 141  142 138  K 4.7   < > 4.6 4.0  3.9 3.7  CL 105  --  107  --  105  CO2 22  --  20*  --  22  GLUCOSE 118*  --  115*  --  127*  BUN 19  --  25*  --  32*  CREATININE 1.75*  --  2.78*  --  2.97*  CALCIUM 9.4  --  9.2  --  8.7*  PROT 6.9  --  6.9  --  6.4*  ALBUMIN 3.8  --  3.8  --  3.3*  AST 21  --  62*  --  55*  ALT 15  --  32  --  41  ALKPHOS 77  --  75  --  67  BILITOT 1.6*  --  1.1  --  1.1  GFRNONAA 39*  --  23*  --  21*  GFRAA 46*  --  26*  --  24*  ANIONGAP 11  --  10  --  11   < > = values in this interval not displayed.     Hematology Recent Labs  Lab 06/22/19 1555 06/22/19 1555 06/23/19 1235 06/24/19 1712 06/25/19 0421  WBC 3.5*  --  5.2  --  7.4  RBC 3.86*  --  3.78*  --  3.69*  HGB 11.7*   < > 11.4* 12.6*  12.2* 11.3*  HCT 36.9*   < > 36.5* 37.0*  36.0* 35.0*  MCV 95.6  --  96.6  --  94.9  MCH 30.3  --  30.2  --  30.6  MCHC 31.7  --  31.2  --  32.3  RDW 14.0  --  14.0  --  14.2  PLT 180  --  179  --  200   < > = values in this interval not displayed.    BNP Recent Labs  Lab 06/23/19 1527  BNP 613.2*     DDimer No results for input(s): DDIMER in the last 168 hours.   Radiology    IR Catheter Tube Change  Result Date: 06/24/2019 CLINICAL DATA:  The external portion of previously placed cholecystostomy catheter was inadvertently cut in half EXAM: [place]:  1.3 minutes; 30  uGym2 DAP TECHNIQUE: The cholecystostomy catheter fragment and surrounding skin were prepped with Betadine, draped in usual sterile fashion.  Subcutaneous tissues infiltrated with 1% lidocaine. A small amount of contrast was injected through the nephrostomy catheter showing small contracted gallbladder with stones. No free intraperitoneal spread. Contrast leaks back along the catheter tract. The catheter was exchanged over a 0.035" angiographic wire for a 5 [place] Kumpe catheter and additional injection performed confirming the above findings. The catheter was then exchanged over the wire for a new 10-French pigtail catheter, formed centrally within the contracted gallbladder lumen under fluoroscopy. Contrast injection confirms appropriate positioning. The catheter was secured externally with a Statlock devices and 0 Prolene suture. The patient tolerated the procedure well. COMPLICATIONS: None immediate IMPRESSION: 1. Technically successful exchange of cholecystostomy catheter under fluoroscopy Electronically Signed   By: Lucrezia Europe M.D.   On: 06/24/2019 12:16   CARDIAC CATHETERIZATION  Result Date: 06/24/2019 Findings: RA = 19 RV = 49/16 PA = 54/17 (34) PCW = 27 Fick cardiac output/index = 3.8/2.0 Thermo CO/CI = 4.7/2.5 PVR = 1.8 (Fick) FA sat = 99% PA sat = 54%, 60% Assessment: 1. Low output HF biventricular failure and marked volume overload Plan/Discussion: Start milrinone. Diuresis. Move to ICU for hemodynamic monitoring. Can upgrade to mechanical support as needed. Glori Bickers, MD 5:29 PM  DG Chest Port 1 View  Result Date: 06/23/2019 CLINICAL DATA:  Acute shortness of breath EXAM: PORTABLE CHEST 1 VIEW COMPARISON:  06/23/2019 FINDINGS: Single frontal view of the chest demonstrates interval placement of external defibrillator pads. Single lead AICD unchanged. Cardiac silhouette remains enlarged. There is continued vascular congestion with mild interstitial edema. No airspace disease, effusion, or pneumothorax. There is chronic right basilar scarring/atelectasis versus pleural thickening. No acute bony abnormalities. IMPRESSION: 1. Stable  vascular congestion and interstitial edema. Electronically Signed   By: Randa Ngo M.D.   On: 06/23/2019 15:14   DG Chest Portable 1 View  Result Date: 06/23/2019 CLINICAL DATA:  Tachycardia.  Shortness of breath. EXAM: PORTABLE CHEST 1 VIEW COMPARISON:  06/01/2019 FINDINGS: 1145 hours. The cardio pericardial silhouette is enlarged. Leftward patient rotation. Similar appearance vascular congestion with possible interstitial edema. Right base atelectasis without substantial pleural effusion. Left pacer/AICD again noted. Telemetry leads overlie the chest. IMPRESSION: Cardiomegaly with vascular congestion and potential component of interstitial edema. Electronically Signed   By: Misty Stanley M.D.   On: 06/23/2019 11:57    Cardiac Studies   Yesterday RHC RA = 19 RV = 49/16 PA = 54/17 (34) PCW = 27 Fick cardiac output/index = 3.8/2.0 Thermo CO/CI = 4.7/2.5 PVR = 1.8 (Fick) FA sat = 99% PA sat = 54%, 60%  Assessment:  1. Low output HF biventricular failure and marked volume overload  Plan/Discussion:  Start milrinone. Diuresis. Move to ICU for hemodynamic monitoring. Can upgrade to mechanical support as needed.     06/09/19: TEE FINDINGS:  LEFT VENTRICLE: EF =<15%. Global hypokinesis.  RIGHT VENTRICLE: Normal size and mildly reducedfunction.   LEFT ATRIUM: No thrombus/mass.Dilated.  LEFT ATRIAL APPENDAGE:There are two distinct well organized thrombi seen in the LAA.  RIGHT ATRIUM: No thrombus/mass.Chiari network. Dilated.  AORTIC VALVE: Trileaflet. Mild-moderateregurgitation. No vegetation.  MITRAL VALVE: Normal structure. Mildregurgitation. No vegetation.  TRICUSPID VALVE: Normal structure.Moderate eccentricregurgitation. No vegetation.  PULMONIC VALVE: Grossly normal structure.Mild-moderateregurgitation. No apparent vegetation.  INTERATRIAL SEPTUM: No PFO or ASD seen by color Doppler.  PERICARDIUM: No effusion noted.  DESCENDING  AORTA: Moderate diffuse plaque seen   CONCLUSION:Severely reduced LVEF. Two separate, well organized thrombi seen in LAA. No cardioversion performed. Given persistent thrombi and afib RVR without ability to cardiovert, contacted both afib clinic and Dr. Lovena Le to alert them to results.  Patient Profile     69 y.o. male with a hx of NICM, chronic CHF (systolic), ICD, Hep C, AFib stroke (after missed doses of his Xarelto >> eliquis, recurrent stroke >> [name]), HTN   Device information BSCi single chamber ICD, implanted 01/01/2014  AFib hx diagnosed +/- 2018  AAD Hx Tikosyn started discontinued after his stroke and discontinuation of a/c    Admitted to [facility] for evaluation of cholecystotomy drain, he was in rapid AFib and found in acute on chronic CHF   Assessment & Plan    Cardiogenic shock  Afib pers longstanding with RVR  LA clot on TEE  Renal insufficiency acute/chronic  NICM  VT NS  Chronic cholecystitis with drain in place    Situation dire and prognosis poor.  The atrial fib RVR is making things worse ,but at this juncture I do not think slowing his HR >> 100 is going to make an immediate difference  Have discussed with dr DB and agree that he needs hemodynamic support in an effort to stabilize his progressively decompensating condition  Would continue amio for hope of rate control, but am not sanguine both given the concomitant iontropes and the very high sympathetic drive--addressing his shock with support will be a critical first step    Signed, Baldwin [place], PA-C  Virl Axe

## 2019-06-25 NOTE — Progress Notes (Signed)
Advanced Heart Failure Rounding Note   Subjective:    Swan placed yesterday. Milrinone started. IV amio restarted. Remains on heparin. Feels a bit better. Denies SOB.   AF rate mostly 100-115.  Co-ox 65% on milrinone 0.125  Creatinine 2.75 -> 2.97. Making urine   Swan numbers done personally  CVP 15 PA 39/17 (26) PCWP 23 Thermo 3.4/1.8  Objective:   Weight Range:  Vital Signs:   Temp:  [97 F (36.1 C)-99.5 F (37.5 C)] 97 F (36.1 C) (05/06 0900) Pulse Rate:  [0-128] 80 (05/06 0900) Resp:  [19-26] 19 (05/06 0400) BP: (110-140)/(66-105) 129/85 (05/06 0900) SpO2:  [89 %-100 %] 98 % (05/06 0900) Weight:  [74.8 kg] 74.8 kg (05/06 0422) Last BM Date: 06/24/19  Weight change: Filed Weights   06/23/19 1859 06/24/19 0415 06/25/19 0422  Weight: 75.9 kg 76.2 kg 74.8 kg    Intake/Output:   Intake/Output Summary (Last 24 hours) at 06/25/2019 0920 Last data filed at 06/25/2019 0900 Gross per 24 hour  Intake 950.77 ml  Output 1000 ml  Net -49.23 ml     Physical Exam: General:  Sitting up in bed. No resp difficulty HEENT: normal Neck: supple. JVP to jaw. Carotids 2+ bilat; no bruits. No lymphadenopathy or thryomegaly appreciated. Cor: PMI nondisplaced. Irregular tachy + s.3 Lungs: clear Abdomen: soft, nontender, nondistended. No hepatosplenomegaly. No bruits or masses. Good bowel sounds. + perc tube Extremities: no cyanosis, clubbing, rash, tr edema Neuro: alert & orientedx3, cranial nerves grossly intact. moves all 4 extremities w/o difficulty. Affect pleasant  Telemetry: AF 100-115 Personally reviewed   Labs: Basic Metabolic Panel: Recent Labs  Lab 06/22/19 1555 06/22/19 1555 06/23/19 1235 06/24/19 0526 06/24/19 1712 06/25/19 0421 06/25/19 0735  NA 138  --  138 137 141  142 138  --   K 4.2  --  4.7 4.6 4.0  3.9 3.7  --   CL 106  --  105 107  --  105  --   CO2 22  --  22 20*  --  22  --   GLUCOSE 102*  --  118* 115*  --  127*  --   BUN 16  --  19  25*  --  32*  --   CREATININE 1.68*  --  1.75* 2.78*  --  2.97*  --   CALCIUM 9.5   < > 9.4 9.2  --  8.7*  --   MG  --   --   --   --   --   --  1.7   < > = values in this interval not displayed.    Liver Function Tests: Recent Labs  Lab 06/22/19 1555 06/23/19 1235 06/24/19 0526 06/25/19 0421  AST 21 21 62* 55*  ALT 17 15 32 41  ALKPHOS 77 77 75 67  BILITOT 1.5* 1.6* 1.1 1.1  PROT 7.6 6.9 6.9 6.4*  ALBUMIN 4.2 3.8 3.8 3.3*   Recent Labs  Lab 06/22/19 1555 06/23/19 1235  LIPASE 29 24   No results for input(s): AMMONIA in the last 168 hours.  CBC: Recent Labs  Lab 06/22/19 1555 06/23/19 1235 06/24/19 1712 06/25/19 0421  WBC 3.5* 5.2  --  7.4  NEUTROABS  --   --   --  5.8  HGB 11.7* 11.4* 12.6*  12.2* 11.3*  HCT 36.9* 36.5* 37.0*  36.0* 35.0*  MCV 95.6 96.6  --  94.9  PLT 180 179  --  200  Cardiac Enzymes: No results for input(s): CKTOTAL, CKMB, CKMBINDEX, TROPONINI in the last 168 hours.  BNP: BNP (last 3 results) Recent Labs    02/03/19 2007 06/01/19 1413 06/23/19 1527  BNP 922.2* 531.4* 613.2*    ProBNP (last 3 results) No results for input(s): PROBNP in the last 8760 hours.    Other results:  Imaging: IR Catheter Tube Change  Result Date: 06/24/2019 CLINICAL DATA:  The external portion of previously placed cholecystostomy catheter was inadvertently cut in half EXAM: [place]:  1.3 minutes; 94  uGym2 DAP TECHNIQUE: The cholecystostomy catheter fragment and surrounding skin were prepped with Betadine, draped in usual sterile fashion. Subcutaneous tissues infiltrated with 1% lidocaine. A small amount of contrast was injected through the nephrostomy catheter showing small contracted gallbladder with stones. No free intraperitoneal spread. Contrast leaks back along the catheter tract. The catheter was exchanged over a 0.035" angiographic wire for a 5 [place] Kumpe catheter and  additional injection performed confirming the above findings. The catheter was then exchanged over the wire for a new 10-French pigtail catheter, formed centrally within the contracted gallbladder lumen under fluoroscopy. Contrast injection confirms appropriate positioning. The catheter was secured externally with a Statlock devices and 0 Prolene suture. The patient tolerated the procedure well. COMPLICATIONS: None immediate IMPRESSION: 1. Technically successful exchange of cholecystostomy catheter under fluoroscopy Electronically Signed   By: Lucrezia Europe M.D.   On: 06/24/2019 12:16   CARDIAC CATHETERIZATION  Result Date: 06/24/2019 Findings: RA = 19 RV = 49/16 PA = 54/17 (34) PCW = 27 Fick cardiac output/index = 3.8/2.0 Thermo CO/CI = 4.7/2.5 PVR = 1.8 (Fick) FA sat = 99% PA sat = 54%, 60% Assessment: 1. Low output HF biventricular failure and marked volume overload Plan/Discussion: Start milrinone. Diuresis. Move to ICU for hemodynamic monitoring. Can upgrade to mechanical support as needed. Glori Bickers, MD 5:29 PM  DG Chest Port 1 View  Result Date: 06/23/2019 CLINICAL DATA:  Acute shortness of breath EXAM: PORTABLE CHEST 1 VIEW COMPARISON:  06/23/2019 FINDINGS: Single frontal view of the chest demonstrates interval placement of external defibrillator pads. Single lead AICD unchanged. Cardiac silhouette remains enlarged. There is continued vascular congestion with mild interstitial edema. No airspace disease, effusion, or pneumothorax. There is chronic right basilar scarring/atelectasis versus pleural thickening. No acute bony abnormalities. IMPRESSION: 1. Stable vascular congestion and interstitial edema. Electronically Signed   By: Randa Ngo M.D.   On: 06/23/2019 15:14   DG Chest Portable 1 View  Result Date: 06/23/2019 CLINICAL DATA:  Tachycardia.  Shortness of breath. EXAM: PORTABLE CHEST 1 VIEW COMPARISON:  06/01/2019 FINDINGS: 1145 hours. The cardio pericardial silhouette is enlarged.  Leftward patient rotation. Similar appearance vascular congestion with possible interstitial edema. Right base atelectasis without substantial pleural effusion. Left pacer/AICD again noted. Telemetry leads overlie the chest. IMPRESSION: Cardiomegaly with vascular congestion and potential component of interstitial edema. Electronically Signed   By: Misty Stanley M.D.   On: 06/23/2019 11:57      Medications:     Scheduled Medications: . atorvastatin  20 mg Oral Daily  . Chlorhexidine Gluconate Cloth  6 each Topical Daily  . furosemide  80 mg Intravenous BID  . ipratropium  2.5 mL Inhalation BID  . pantoprazole  40 mg Oral Daily  . potassium chloride  40 mEq Oral Q8H  . sodium chloride flush  10-40 mL Intracatheter Q12H  . sodium chloride flush  10-40 mL Intracatheter Q12H  . sodium chloride flush  3 mL Intravenous Once  . sodium chloride flush  3 mL Intravenous Q12H     Infusions: . amiodarone 30 mg/hr (06/25/19 0900)  . heparin 1,100 Units/hr (06/25/19 0900)  . milrinone 0.125 mcg/kg/min (06/25/19 0900)     PRN Medications:  acetaminophen **OR** acetaminophen, calcium carbonate, lidocaine (PF), ondansetron **OR** ondansetron (ZOFRAN) IV, sodium chloride flush, sodium chloride flush, traMADol   Assessment/Plan:   1. Acute on Chronic Combined Systolic and Diastolic Heart Failure -> cardiogenic shock - 2/2 NICM. HF dates back to 2015.  - Most recent echo 4/21 w/ EF <20. RV mildly reduced. GIIIDD. Moderate TR. No LVH.  - [facility] 10/2018 showed normal cors, elevated filling pressures and low CI at 1.9  - Now admitted w/ NYHA IV symptoms, volume overload and worsening renal function, likely cardiorenal syndrome. SCr now 2.97 (prior baseline 1.2). BP ok. No hypotension  - RHC 5/5 showed Low output biventricular failure and marked volume overload. PCWP 27. CI 2.0.  - Now on milrinone, 0.125. CI up to 2.76 by Fick Calculation but remains low by thermo. Co-ox improved from 52%>>65%  today - Diuresing ok w/ IV Lasix but remains volume overloaded. CVP 15. - GDMT limited by AKI and shock - very difficult situation. Output still low but improving. Will increase milrinone to 0.25 being mindful of HR control . - I will d/w Dr. [name] today to try to better understand if he feels this is truly an AF cardiomyopathy. In which case aggressive attempt at AF control (amio vs AVN ablation and CRT) may prove fruitful. If AF is secondary to his underlying HF then I fear we may not be successful as he is a poor candidate for advanced therapies given an extensive h/o medication noncompliance.  - Plan for today: 1) increase milrinone. 2) continue IV diuresis 3) continue amio and heparin in an attempt to get better rate control .4) watch co-ox, CVP and renal function  2. AKI - due to shock/cardiorenal - Scr continues to rise, 2.97 today. Baseline ~1.2 - will hopefully improve w/ inotrope/diuresis  - monitor closely   3. Persistent Atrial Fibrillation w/ RVR - Recent DCCV attempt aborted 4/20 due to LAA thrombi on TEE - current v-rates 90s-120s w/ limited rate/ rhythm control options. - use of digoxin limited due to AKI w/ SCr >2 - will avoid?blocker currently given low output and acute decompensation - continue IV amiodarone for rate control. Monitor closely while on milrinone - may need to consider AV node ablation. He has a functioning single chamber ICD but may benefit from upgrade to CRT-D. - continue IV amio and IV heparin - risk of possible embolic event discussed if chemical cardioversion with IV amio. Unfortunately no other options  - see discussion above - EP on board  4. NSVT - in the setting of severe LV dysfunction and milrinone  - continue IV amiodarone  - he has ICD - keep K >4.0 and Mg >2.0   5. Chronic Cholecystitis  - has perc biliary drain, stable. AF. WBC WNL  - management per primary team/ surgery   CRITICAL CARE Performed by: Glori Bickers  Total critical care time: 45 minutes  Critical care time was exclusive of separately billable procedures and treating other patients.  Critical care was necessary to treat or prevent imminent or life-threatening deterioration.  Critical care was time spent personally by me (independent of midlevel providers or residents) on the following activities: development of treatment plan with patient and/or surrogate as well as  nursing, discussions with consultants, evaluation of patient's response to treatment, examination of patient, obtaining history from patient or surrogate, ordering and performing treatments and interventions, ordering and review of laboratory studies, ordering and review of radiographic studies, pulse oximetry and re-evaluation of patient's condition.     Length of Stay: 2   Glori Bickers  MD 06/25/2019, 9:20 AM  Advanced Heart Failure Team Pager [phone] (M-F; 7a - 4p)  Please contact [facility] for night-coverage after hours (4p -7a ) and weekends on [email or web address]

## 2019-06-25 NOTE — Progress Notes (Signed)
Pafed cardiology d/t Afib RVR -150-160. Orders placed. Will continue to monitor

## 2019-06-25 NOTE — Progress Notes (Signed)
Glenn [name] for Heparin Indication: atrial fibrillation  Allergies  Allergen Reactions  . Benadryl [Diphenhydramine] Palpitations    Patient Measurements: Height: 5\' 7"  (170.2 cm) Weight: 74.8 kg (164 lb 14.5 oz) IBW/kg (Calculated) : 66.1 Heparin Dosing Weight: 76.2 kg  Vital Signs: Temp: 97.2 F (36.2 C) (05/06 1600) BP: 133/96 (05/06 1600) Pulse Rate: 80 (05/06 0900)  Labs: Recent Labs    06/23/19 1235 06/23/19 1235 06/24/19 0526 06/24/19 1712 06/25/19 0421 06/25/19 1554  HGB 11.4*   < >  --  12.6*  12.2* 11.3*  --   HCT 36.5*  --   --  37.0*  36.0* 35.0*  --   PLT 179  --   --   --  200  --   LABPROT 22.3*  --  22.1*  --  20.2*  --   INR 2.0*  --  2.0*  --  1.8*  --   HEPARINUNFRC  --   --   --   --  <0.10* <0.10*  CREATININE 1.75*  --  2.78*  --  2.97*  --    < > = values in this interval not displayed.    Estimated Creatinine Clearance: 22.6 mL/min (A) (by C-G formula based on SCr of 2.97 mg/dL (H)).   Medical History: Past Medical History:  Diagnosis Date  . Acute blood loss anemia   . Acute CVA (cerebrovascular accident) ([place])   . Acute deep vein thrombosis (DVT) of both lower extremities ([name])   . Acute kidney injury ([place])   . Acute on chronic combined systolic and diastolic CHF (congestive heart failure) ([place])   . Acute renal failure superimposed on stage 3a chronic kidney disease ([place])   . Acute respiratory failure with hypoxia ([place])   . Atrial fibrillation ([place])   . Carpal tunnel syndrome of right wrist 02/28/2018  . Cerebral edema ([place]) 11/13/2018  . Cerebral infarction ([place])   . CHF (congestive heart failure) ([place])   . Cholecystitis 02/04/2019  . Chronic right hip pain   . DCM (dilated cardiomyopathy) ([place])   . Dysphagia, post-stroke   . Elevated troponin   . Entrapment of right ulnar nerve 02/28/2018  . Headache due to intracranial disease 11/14/2018  . Hepatitis C   . History of hemorrhagic stroke  with residual hemiparesis ([place]) 02/04/2019  . HTN (hypertension) 08/14/2016  . Hyperlipidemia LDL goal <70 11/13/2018  . Hypertension   . ICD (implantable cardioverter-defibrillator) in place 09/13/2016  . Impotence due to erectile dysfunction 09/30/2017  . Ischemic cardiomyopathy   . Labile blood glucose   . Left leg DVT ([place]) 02/04/2019  . Marijuana user 11/13/2018  . Paroxysmal atrial fibrillation ([name])   . Right middle cerebral artery stroke ([place]) 11/13/2018  . Solitary pulmonary nodule 06/10/2017   5 mm RUL nodule noted incidentally as part of CVA workup 08/2016. With smoking history would obtain low-dose CT scan 08/2017.   . Stroke (cerebrum) ([name]) Lg L MCA infarct w/ hemorrhagic conversion, embolic d/t AF 1/61/0960  . Stroke ([place])   . Trochanteric bursitis, right hip 11/14/2018  . Visit for monitoring Tikosyn therapy 03/26/2017    Assessment: Pt is a 69 yo M with atrial fibrillation on dabigatran PTA. Pt reports last dose was the morning of 06/23/19. Patient had exchange of cholecystomy tubing earlier this AM. Pharmacy asked to switch to heparin and received OK from IR to start now. CBC stable, no overt bleeding noted. Scr elevated 2.78.  5/6 PM update:  Heparin level undetectable at <0.1   Goal of Therapy:  Heparin level 0.3-0.7 units/ml Monitor platelets by anticoagulation protocol: Yes   Plan:  Increase Heparin drip to 1250 units/hr Draw heparin level in 8 hours  Glenn [name], PharmD, [facility] Clinical Pharmacist Please see AMION for all Pharmacists' Contact Phone Numbers 06/25/2019, 5:15 PM

## 2019-06-25 NOTE — Progress Notes (Deleted)
Entered in error. See MD progress note.

## 2019-06-25 NOTE — Progress Notes (Signed)
[place] for Heparin Indication: atrial fibrillation  Allergies  Allergen Reactions  . Benadryl [Diphenhydramine] Palpitations    Patient Measurements: Height: 5\' 7"  (170.2 cm) Weight: 74.8 kg (164 lb 14.5 oz) IBW/kg (Calculated) : 66.1 Heparin Dosing Weight: 76.2 kg  Vital Signs: Temp: 97.9 F (36.6 C) (05/06 0518) Temp Source: Core (05/06 0400) BP: 117/86 (05/06 0518) Pulse Rate: 97 (05/06 0518)  Labs: Recent Labs    06/22/19 1555 06/22/19 1555 06/23/19 1235 06/23/19 1235 06/24/19 0526 06/24/19 1712 06/25/19 0421  HGB 11.7*   < > 11.4*   < >  --  12.6*  12.2* 11.3*  HCT 36.9*   < > 36.5*  --   --  37.0*  36.0* 35.0*  PLT 180  --  179  --   --   --  200  LABPROT  --   --  22.3*  --  22.1*  --  20.2*  INR  --   --  2.0*  --  2.0*  --  1.8*  HEPARINUNFRC  --   --   --   --   --   --  <0.10*  CREATININE 1.68*  --  1.75*  --  2.78*  --   --    < > = values in this interval not displayed.    Estimated Creatinine Clearance: 24.1 mL/min (A) (by C-G formula based on SCr of 2.78 mg/dL (H)).   Medical History: Past Medical History:  Diagnosis Date  . Acute blood loss anemia   . Acute CVA (cerebrovascular accident) ([place])   . Acute deep vein thrombosis (DVT) of both lower extremities ([name])   . Acute kidney injury ([place])   . Acute on chronic combined systolic and diastolic CHF (congestive heart failure) ([place])   . Acute renal failure superimposed on stage 3a chronic kidney disease ([place])   . Acute respiratory failure with hypoxia ([place])   . Atrial fibrillation ([place])   . Carpal tunnel syndrome of right wrist 02/28/2018  . Cerebral edema ([place]) 11/13/2018  . Cerebral infarction ([place])   . CHF (congestive heart failure) ([place])   . Cholecystitis 02/04/2019  . Chronic right hip pain   . DCM (dilated cardiomyopathy) ([place])   . Dysphagia, post-stroke   . Elevated troponin   . Entrapment of right ulnar nerve 02/28/2018  . Headache due to  intracranial disease 11/14/2018  . Hepatitis C   . History of hemorrhagic stroke with residual hemiparesis ([employer]) 02/04/2019  . HTN (hypertension) 08/14/2016  . Hyperlipidemia LDL goal <70 11/13/2018  . Hypertension   . ICD (implantable cardioverter-defibrillator) in place 09/13/2016  . Impotence due to erectile dysfunction 09/30/2017  . Ischemic cardiomyopathy   . Labile blood glucose   . Left leg DVT ([place]) 02/04/2019  . Marijuana user 11/13/2018  . Paroxysmal atrial fibrillation ([name])   . Right middle cerebral artery stroke ([place]) 11/13/2018  . Solitary pulmonary nodule 06/10/2017   5 mm RUL nodule noted incidentally as part of CVA workup 08/2016. With smoking history would obtain low-dose CT scan 08/2017.   . Stroke (cerebrum) ([name]) Lg L MCA infarct w/ hemorrhagic conversion, embolic d/t AF 0/25/8527  . Stroke ([place])   . Trochanteric bursitis, right hip 11/14/2018  . Visit for monitoring Tikosyn therapy 03/26/2017    Assessment: Pt is a 68 yo M with atrial fibrillation on dabigatran PTA. Pt reports last dose was the morning of 06/23/19. Patient had exchange of cholecystomy tubing earlier this AM. Pharmacy  asked to switch to heparin and received OK from IR to start now. CBC stable, no overt bleeding noted. Scr elevated 2.78.  5/6 AM update:  Heparin drip not re-started last night RN to re-start now  Goal of Therapy:  Heparin level 0.3-0.7 units/ml Monitor platelets by anticoagulation protocol: Yes   Plan:  Heparin drip 1100 units/hr 1400 heparin level  Narda Bonds, PharmD, BCPS Clinical Pharmacist Phone: [phone]

## 2019-06-25 NOTE — Progress Notes (Signed)
Paged cardiology to come to bedside d/t patient being increasingly SOB

## 2019-06-26 ENCOUNTER — Encounter (HOSPITAL_COMMUNITY)
Admission: EM | Disposition: A | Discharge status: Skilled Nursing Facility | Payer: Self-pay | Source: Home / Self Care | Attending: Internal Medicine

## 2019-06-26 DIAGNOSIS — R570 Cardiogenic shock: Secondary | ICD-10-CM | POA: Diagnosis not present

## 2019-06-26 DIAGNOSIS — N170 Acute kidney failure with tubular necrosis: Secondary | ICD-10-CM | POA: Diagnosis not present

## 2019-06-26 DIAGNOSIS — I5023 Acute on chronic systolic (congestive) heart failure: Secondary | ICD-10-CM

## 2019-06-26 DIAGNOSIS — I4891 Unspecified atrial fibrillation: Secondary | ICD-10-CM | POA: Diagnosis not present

## 2019-06-26 HISTORY — PX: IABP INSERTION: CATH118242

## 2019-06-26 LAB — COMPREHENSIVE METABOLIC PANEL
ALT: 39 U/L (ref 0–44)
AST: 42 U/L — ABNORMAL HIGH (ref 15–41)
Albumin: 3.3 g/dL — ABNORMAL LOW (ref 3.5–5.0)
Alkaline Phosphatase: 70 U/L (ref 38–126)
Anion gap: 14 (ref 5–15)
BUN: 35 mg/dL — ABNORMAL HIGH (ref 8–23)
CO2: 20 mmol/L — ABNORMAL LOW (ref 22–32)
Calcium: 8.4 mg/dL — ABNORMAL LOW (ref 8.9–10.3)
Chloride: 101 mmol/L (ref 98–111)
Creatinine, Ser: 2.74 mg/dL — ABNORMAL HIGH (ref 0.61–1.24)
GFR calc Af Amer: 27 mL/min — ABNORMAL LOW (ref >60)
GFR calc non Af Amer: 23 mL/min — ABNORMAL LOW (ref >60)
Glucose, Bld: 121 mg/dL — ABNORMAL HIGH (ref 70–99)
Potassium: 4 mmol/L (ref 3.5–5.1)
Sodium: 135 mmol/L (ref 135–145)
Total Bilirubin: 1.2 mg/dL (ref 0.3–1.2)
Total Protein: 6.7 g/dL (ref 6.5–8.1)

## 2019-06-26 LAB — COOXEMETRY PANEL
Carboxyhemoglobin: 0.9 % (ref 0.5–1.5)
Carboxyhemoglobin: 1.1 % (ref 0.5–1.5)
Carboxyhemoglobin: 1.5 % (ref 0.5–1.5)
Carboxyhemoglobin: 1.6 % — ABNORMAL HIGH (ref 0.5–1.5)
Carboxyhemoglobin: 1.9 % — ABNORMAL HIGH (ref 0.5–1.5)
Methemoglobin: 0.5 % (ref 0.0–1.5)
Methemoglobin: 0.6 % (ref 0.0–1.5)
Methemoglobin: 0.9 % (ref 0.0–1.5)
Methemoglobin: 1 % (ref 0.0–1.5)
Methemoglobin: 0.9 % (ref 0.0–1.5)
O2 Saturation: 34.3 %
O2 Saturation: 51.4 %
O2 Saturation: 51.2 %
O2 Saturation: 48.1 %
O2 Saturation: 53.8 %
Total hemoglobin: 11.8 g/dL — ABNORMAL LOW (ref 12.0–16.0)
Total hemoglobin: 7 g/dL — ABNORMAL LOW (ref 12.0–16.0)
Total hemoglobin: 11.7 g/dL — ABNORMAL LOW (ref 12.0–16.0)
Total hemoglobin: 11.2 g/dL — ABNORMAL LOW (ref 12.0–16.0)
Total hemoglobin: 11.5 g/dL — ABNORMAL LOW (ref 12.0–16.0)

## 2019-06-26 LAB — BASIC METABOLIC PANEL
Anion gap: 9 (ref 5–15)
BUN: 32 mg/dL — ABNORMAL HIGH (ref 8–23)
CO2: 21 mmol/L — ABNORMAL LOW (ref 22–32)
Calcium: 8.5 mg/dL — ABNORMAL LOW (ref 8.9–10.3)
Chloride: 105 mmol/L (ref 98–111)
Creatinine, Ser: 2.58 mg/dL — ABNORMAL HIGH (ref 0.61–1.24)
GFR calc Af Amer: 29 mL/min — ABNORMAL LOW (ref >60)
GFR calc non Af Amer: 25 mL/min — ABNORMAL LOW (ref >60)
Glucose, Bld: 140 mg/dL — ABNORMAL HIGH (ref 70–99)
Potassium: 4.5 mmol/L (ref 3.5–5.1)
Sodium: 135 mmol/L (ref 135–145)

## 2019-06-26 LAB — CBC
HCT: 37.2 % — ABNORMAL LOW (ref 39.0–52.0)
Hemoglobin: 11.9 g/dL — ABNORMAL LOW (ref 13.0–17.0)
MCH: 30.5 pg (ref 26.0–34.0)
MCHC: 32 g/dL (ref 30.0–36.0)
MCV: 95.4 fL (ref 80.0–100.0)
Platelets: 227 10*3/uL (ref 150–400)
RBC: 3.9 MIL/uL — ABNORMAL LOW (ref 4.22–5.81)
RDW: 14.1 % (ref 11.5–15.5)
WBC: 8.5 10*3/uL (ref 4.0–10.5)
nRBC: 0 % (ref 0.0–0.2)

## 2019-06-26 LAB — LACTIC ACID, PLASMA
Lactic Acid, Venous: 0.9 mmol/L (ref 0.5–1.9)
Lactic Acid, Venous: 1 mmol/L (ref 0.5–1.9)

## 2019-06-26 LAB — MAGNESIUM: Magnesium: 2.4 mg/dL (ref 1.7–2.4)

## 2019-06-26 LAB — HEPARIN LEVEL (UNFRACTIONATED)
Heparin Unfractionated: 0.32 IU/mL (ref 0.30–0.70)
Heparin Unfractionated: 0.46 IU/mL (ref 0.30–0.70)

## 2019-06-26 SURGERY — IABP INSERTION
Anesthesia: LOCAL

## 2019-06-26 MED ORDER — HEPARIN (PORCINE) IN NACL 1000-0.9 UT/500ML-% IV SOLN
INTRAVENOUS | Status: AC
  Filled 2019-06-26: qty 500

## 2019-06-26 MED ORDER — LIDOCAINE HCL (PF) 1 % IJ SOLN
INTRAMUSCULAR | Status: AC
  Filled 2019-06-26: qty 30

## 2019-06-26 MED ORDER — PROMETHAZINE HCL 25 MG/ML IJ SOLN
12.5000 mg | Freq: Once | INTRAMUSCULAR | Status: AC
  Administered 2019-06-26: 12.5 mg via INTRAVENOUS
  Filled 2019-06-26: qty 1

## 2019-06-26 MED ORDER — HEPARIN (PORCINE) IN NACL 1000-0.9 UT/500ML-% IV SOLN
INTRAVENOUS | Status: DC | PRN
  Administered 2019-06-26 (×2): 500 mL

## 2019-06-26 MED ORDER — NOREPINEPHRINE 4 MG/250ML-% IV SOLN
0.0000 ug/min | INTRAVENOUS | Status: DC
  Administered 2019-06-27 – 2019-06-30 (×4): 3 ug/min via INTRAVENOUS
  Filled 2019-06-26 (×4): qty 250

## 2019-06-26 MED ORDER — FENTANYL CITRATE (PF) 100 MCG/2ML IJ SOLN
INTRAMUSCULAR | Status: AC
  Filled 2019-06-26: qty 2

## 2019-06-26 MED ORDER — NOREPINEPHRINE 4 MG/250ML-% IV SOLN
INTRAVENOUS | Status: AC
  Administered 2019-06-26: 3 ug/min via INTRAVENOUS
  Filled 2019-06-26: qty 250

## 2019-06-26 MED ORDER — SODIUM CHLORIDE 0.9% FLUSH: 3.0000 mL | INTRAVENOUS | Status: DC | PRN

## 2019-06-26 MED ORDER — ASPIRIN 81 MG PO CHEW
81.0000 mg | CHEWABLE_TABLET | ORAL | Status: AC
  Administered 2019-06-26: 81 mg via ORAL
  Filled 2019-06-26: qty 1

## 2019-06-26 MED ORDER — FENTANYL CITRATE (PF) 100 MCG/2ML IJ SOLN
INTRAMUSCULAR | Status: DC | PRN
  Administered 2019-06-26: 25 ug via INTRAVENOUS

## 2019-06-26 MED ORDER — SODIUM CHLORIDE 0.9 % IV SOLN: INTRAVENOUS | Status: DC

## 2019-06-26 MED ORDER — ONDANSETRON HCL 4 MG/2ML IJ SOLN
INTRAMUSCULAR | Status: AC
  Filled 2019-06-26: qty 2

## 2019-06-26 MED ORDER — MIDAZOLAM HCL 2 MG/2ML IJ SOLN
INTRAMUSCULAR | Status: AC
  Filled 2019-06-26: qty 2

## 2019-06-26 MED ORDER — MIDAZOLAM HCL 2 MG/2ML IJ SOLN
INTRAMUSCULAR | Status: DC | PRN
  Administered 2019-06-26: 1 mg via INTRAVENOUS

## 2019-06-26 MED ORDER — FUROSEMIDE 10 MG/ML IJ SOLN
20.0000 mg/h | INTRAVENOUS | Status: DC
  Administered 2019-06-26 – 2019-06-30 (×7): 20 mg/h via INTRAVENOUS
  Filled 2019-06-26: qty 25
  Filled 2019-06-26: qty 21
  Filled 2019-06-26 (×4): qty 25
  Filled 2019-06-26: qty 21
  Filled 2019-06-26: qty 25

## 2019-06-26 MED ORDER — SODIUM CHLORIDE 0.9% FLUSH
3.0000 mL | Freq: Two times a day (BID) | INTRAVENOUS | Status: DC
  Administered 2019-06-26 – 2019-07-02 (×8): 3 mL via INTRAVENOUS
  Administered 2019-07-03 – 2019-07-04 (×2): 10 mL via INTRAVENOUS
  Administered 2019-07-04 – 2019-07-16 (×23): 3 mL via INTRAVENOUS

## 2019-06-26 MED ORDER — SODIUM CHLORIDE 0.9 % IV SOLN: 250.0000 mL | INTRAVENOUS | Status: DC | PRN

## 2019-06-26 MED ORDER — LIDOCAINE HCL (PF) 1 % IJ SOLN
INTRAMUSCULAR | Status: DC | PRN
  Administered 2019-06-26: 20 mL

## 2019-06-26 MED ORDER — ONDANSETRON HCL 4 MG/2ML IJ SOLN
INTRAMUSCULAR | Status: DC | PRN
  Administered 2019-06-26: 4 mg via INTRAVENOUS

## 2019-06-26 SURGICAL SUPPLY — 8 items
BALLN IABP SENSA PLUS 8F 50CC (BALLOONS) ×2
BALLOON IABP SENS PLUS 8F 50CC (BALLOONS) IMPLANT
HOVERMATT SINGLE USE (MISCELLANEOUS) ×1 IMPLANT
KIT HEART LEFT (KITS) ×2 IMPLANT
PACK CARDIAC CATHETERIZATION (CUSTOM PROCEDURE TRAY) ×2 IMPLANT
SHEATH PINNACLE 5F 10CM (SHEATH) ×1 IMPLANT
TRANSDUCER W/STOPCOCK (MISCELLANEOUS) ×2 IMPLANT
WIRE EMERALD 3MM-J .035X150CM (WIRE) ×1 IMPLANT

## 2019-06-26 NOTE — H&P (View-Only) (Signed)
Advanced Heart Failure Rounding Note   Subjective:    Yesterday Milrinone increased to 0.25 mcg  but later cut back to 0.125 mcg due to A fib RVR. Diuresed with IV lasix .    Remains in A fib RVR  on amio 60 mg per hour  Todays CO-OX is 34%. Repeat CO-OX -51%.    Complaining of nausea and increased shortness of breath.   CVP 25 PA 44/30 PCWP 30 PAPi 0.56 Thermo CO 2.8 CI 1.5   Objective:   Weight Range:  Vital Signs:   Temp:  [97 F (36.1 C)-98.8 F (37.1 C)] 97.3 F (36.3 C) (05/07 0700) Pulse Rate:  [80-147] 127 (05/07 0700) Resp:  [10-33] 14 (05/07 0700) BP: (112-144)/(64-107) 112/78 (05/07 0000) SpO2:  [91 %-100 %] 97 % (05/07 0700) Last BM Date: 06/25/19  Weight change: Filed Weights   06/23/19 1859 06/24/19 0415 06/25/19 0422  Weight: 75.9 kg 76.2 kg 74.8 kg    Intake/Output:   Intake/Output Summary (Last 24 hours) at 06/26/2019 0731 Last data filed at 06/26/2019 0600 Gross per 24 hour  Intake 1339.23 ml  Output 1875 ml  Net -535.77 ml    CVP 25  Physical Exam: General:  Appears weak. . No resp difficulty HEENT: normal Neck: supple. JVP to jaw  Carotids 2+ bilat; no bruits. No lymphadenopathy or thryomegaly appreciated. Cor: PMI nondisplaced. Regular rate & rhythm. No rubs, or murmurs. +S3  Lungs: clear Abdomen: soft, nontender, nondistended. No hepatosplenomegaly. No bruits or masses. Good bowel sounds. Extremities: no cyanosis, clubbing, rash, edema Neuro: alert & orientedx3, cranial nerves grossly intact. moves all 4 extremities w/o difficulty. Affect flat  Telemetry: AF 100-115 Personally reviewed   Labs: Basic Metabolic Panel: Recent Labs  Lab 06/23/19 1235 06/23/19 1235 06/24/19 0526 06/24/19 0526 06/24/19 1712 06/25/19 0421 06/25/19 0735 06/25/19 2123 06/26/19 0511  NA 138   < > 137  --  141   142 138  --  136 135  K 4.7   < > 4.6  --  4.0   3.9 3.7  --  4.2 4.5  CL 105  --  107  --   --  105  --  103 105  CO2 22  --  20*   --   --  22  --  21* 21*  GLUCOSE 118*  --  115*  --   --  127*  --  142* 140*  BUN 19  --  25*  --   --  32*  --  32* 32*  CREATININE 1.75*  --  2.78*  --   --  2.97*  --  2.52* 2.58*  CALCIUM 9.4   < > 9.2   < >  --  8.7*  --  8.8* 8.5*  MG  --   --   --   --   --   --  1.7  --  2.4   < > = values in this interval not displayed.    Liver Function Tests: Recent Labs  Lab 06/22/19 1555 06/23/19 1235 06/24/19 0526 06/25/19 0421  AST 21 21 62* 55*  ALT 17 15 32 41  ALKPHOS 77 77 75 67  BILITOT 1.5* 1.6* 1.1 1.1  PROT 7.6 6.9 6.9 6.4*  ALBUMIN 4.2 3.8 3.8 3.3*   Recent Labs  Lab 06/22/19 1555 06/23/19 1235  LIPASE 29 24   No results for input(s): AMMONIA in the last 168 hours.  CBC: Recent Labs  Lab  06/22/19 1555 06/23/19 1235 06/24/19 1712 06/25/19 0421 06/26/19 0511  WBC 3.5* 5.2  --  7.4 8.5  NEUTROABS  --   --   --  5.8  --   HGB 11.7* 11.4* 12.6*   12.2* 11.3* 11.9*  HCT 36.9* 36.5* 37.0*   36.0* 35.0* 37.2*  MCV 95.6 96.6  --  94.9 95.4  PLT 180 179  --  200 227    Cardiac Enzymes: No results for input(s): CKTOTAL, CKMB, CKMBINDEX, TROPONINI in the last 168 hours.  BNP: BNP (last 3 results) Recent Labs    02/03/19 2007 06/01/19 1413 06/23/19 1527  BNP 922.2* 531.4* 613.2*    ProBNP (last 3 results) No results for input(s): PROBNP in the last 8760 hours.    Other results:  Imaging: IR Catheter Tube Change  Result Date: 06/24/2019 CLINICAL DATA:  The external portion of previously placed cholecystostomy catheter was inadvertently cut in half EXAM: [place]:  1.3 minutes; 67  uGym2 DAP TECHNIQUE: The cholecystostomy catheter fragment and surrounding skin were prepped with Betadine, draped in usual sterile fashion. Subcutaneous tissues infiltrated with 1% lidocaine. A small amount of contrast was injected through the nephrostomy catheter showing small contracted gallbladder  with stones. No free intraperitoneal spread. Contrast leaks back along the catheter tract. The catheter was exchanged over a 0.035" angiographic wire for a 5 [place] Kumpe catheter and additional injection performed confirming the above findings. The catheter was then exchanged over the wire for a new 10-French pigtail catheter, formed centrally within the contracted gallbladder lumen under fluoroscopy. Contrast injection confirms appropriate positioning. The catheter was secured externally with a Statlock devices and 0 Prolene suture. The patient tolerated the procedure well. COMPLICATIONS: None immediate IMPRESSION: 1. Technically successful exchange of cholecystostomy catheter under fluoroscopy Electronically Signed   By: Lucrezia Europe M.D.   On: 06/24/2019 12:16   CARDIAC CATHETERIZATION  Result Date: 06/24/2019 Findings: RA = 19 RV = 49/16 PA = 54/17 (34) PCW = 27 Fick cardiac output/index = 3.8/2.0 Thermo CO/CI = 4.7/2.5 PVR = 1.8 (Fick) FA sat = 99% PA sat = 54%, 60% Assessment: 1. Low output HF biventricular failure and marked volume overload Plan/Discussion: Start milrinone. Diuresis. Move to ICU for hemodynamic monitoring. Can upgrade to mechanical support as needed. Glori Bickers, MD 5:29 PM  DG CHEST PORT 1 VIEW  Result Date: 06/25/2019 CLINICAL DATA:  69 year old male with CHF. EXAM: PORTABLE CHEST 1 VIEW COMPARISON:  Chest radiograph dated 06/23/2019. FINDINGS: Right IJ Swan-Ganz with tip in the right middle or right lower lobe pulmonary artery. There is cardiomegaly with mild vascular congestion and probable mild edema. A small right pleural effusion is suspected. No pneumothorax. Left pectoral AICD device. Atherosclerotic calcification of the aorta. No acute osseous pathology. IMPRESSION: 1. Interval placement of a Swan-Ganz catheter. 2. Overall no significant interval change in the vascular congestion since the prior radiograph. Electronically Signed   By: Anner Crete M.D.   On: 06/25/2019  20:25     Medications:     Scheduled Medications:  atorvastatin  20 mg Oral Daily   Chlorhexidine Gluconate Cloth  6 each Topical Daily   furosemide  80 mg Intravenous BID   pantoprazole  40 mg Oral Daily   sodium chloride flush  10-40 mL Intracatheter Q12H   sodium chloride flush  10-40 mL Intracatheter Q12H   sodium chloride flush  3 mL Intravenous Once   sodium chloride flush  3 mL Intravenous Q12H  Infusions:  amiodarone 60 mg/hr (06/26/19 0600)   heparin 1,250 Units/hr (06/26/19 0600)   milrinone 0.125 mcg/kg/min (06/25/19 2127)    PRN Medications: acetaminophen **OR** acetaminophen, calcium carbonate, lidocaine (PF), ondansetron **OR** ondansetron (ZOFRAN) IV, sodium chloride flush, sodium chloride flush, traMADol   Assessment/Plan:   1. Acute on Chronic Combined Systolic and Diastolic Heart Failure -> cardiogenic shock - 2/2 NICM. HF dates back to 2015.  - Most recent echo 4/21 w/ EF <20. RV mildly reduced. GIIIDD. Moderate TR. No LVH.  - [facility] 10/2018 showed normal cors, elevated filling pressures and low CI at 1.9  - Now admitted w/ NYHA IV symptoms, volume overload and worsening renal function, likely cardiorenal syndrome. SCr now 2.97 (prior baseline 1.2). BP ok. No hypotension  - RHC 5/5 showed Low output biventricular failure and marked volume overload. PCWP 27. CI 2.0.  - Milrinone increased to 0.25 mcg on 5/6 but later cut back to 0.125 mcg due to A fib RVR.  - Todays CO-OX 51%. CVP 25. Swan numbers with persistently low cardiac index 1.5.  - Suspect he will need mechanical support. Discuss with Dr Haroldine Laws.    Dr Haroldine Laws discussed with Dr. [name] today to try to better understand if he feels this is truly an AF cardiomyopathy. In which case aggressive attempt at AF control (amio vs AVN ablation and CRT) may prove fruitful. If AF is secondary to his underlying HF then I fear we may not be successful as he is a poor candidate for advanced therapies  given an extensive h/o medication noncompliance.   2. AKI - due to shock/cardiorenal - Creatinine trending down 3>2.58   3. Persistent Atrial Fibrillation w/ RVR - Recent DCCV attempt aborted 4/20 due to LAA thrombi on TEE - current v-rates 90s-120s w/ limited rate/ rhythm control options. - use of digoxin limited due to AKI w/ SCr >2 - Continue amio drip 60 mg per hour.  - will avoid?blocker currently given low output and acute decompensation - may need to consider AV node ablation. He has a functioning single chamber ICD but may benefit from upgrade to CRT-D. - continue IV amio and IV heparin - risk of possible embolic event discussed if chemical cardioversion with IV amio. Unfortunately no other options   4. NSVT - in the setting of severe LV dysfunction and milrinone  - continue IV amiodarone  - he has ICD - keep K >4.0 and Mg >2.0   5. Chronic Cholecystitis  - has perc biliary drain, stable. AF. WBC WNL  - management per primary team/ surgery   Dr Haroldine Laws discussing with Dr [name]. Place IABP.     Length of Stay: 3   Amy Clegg NP-C  06/26/2019, 7:31 AM  Advanced Heart Failure Team Pager [phone] (M-F; 7a - 4p)  Please contact [facility] for night-coverage after hours (4p -7a ) and weekends on [email or web address]  Agree with above.  He struggled overnight with V sat into the 30s. Now 37s. CI 1.5 on Swan. Remains in AF with RVR despite IV amio. MV sat up to 51% today. Renal function slightly improved.   General:  Ill appearing. No resp difficulty HEENT: normal Neck: supple. RIJ swan. Carotids 2+ bilat; no bruits. No lymphadenopathy or thryomegaly appreciated. Cor: PMI nondisplaced. Regular rate & rhythm. No rubs, gallops or murmurs. Lungs: clear Abdomen: soft, nontender, nondistended. No hepatosplenomegaly. No bruits or masses. Good bowel sounds. Extremities: no cyanosis, clubbing, rash, edema Neuro: alert & orientedx3, cranial nerves grossly intact. moves all  4  extremities w/o difficulty. Affect pleasant  He is critically ill. Not tolerating AF well at all. Will need mechanical support followed by AV node ablation. Given PAPi < 1.0. Will try initital support with IABP followed by AV node ablation. If hemodynamics remain poor will step up to [place] 5.0 this afternoon. D/w Dr. [name].   CRITICAL CARE Performed by: Glori Bickers  Total critical care time: 35 minutes  Critical care time was exclusive of separately billable procedures and treating other patients.  Critical care was necessary to treat or prevent imminent or life-threatening deterioration.  Critical care was time spent personally by me (independent of midlevel providers or residents) on the following activities: development of treatment plan with patient and/or surrogate as well as nursing, discussions with consultants, evaluation of patient's response to treatment, examination of patient, obtaining history from patient or surrogate, ordering and performing treatments and interventions, ordering and review of laboratory studies, ordering and review of radiographic studies, pulse oximetry and re-evaluation of patient's condition.  Glori Bickers, MD  9:09 AM

## 2019-06-26 NOTE — Progress Notes (Addendum)
Advanced Heart Failure Rounding Note   Subjective:    Yesterday Milrinone increased to 0.25 mcg  but later cut back to 0.125 mcg due to A fib RVR. Diuresed with IV lasix .    Remains in A fib RVR  on amio 60 mg per hour  Todays CO-OX is 34%. Repeat CO-OX -51%.    Complaining of nausea and increased shortness of breath.   CVP 25 PA 44/30 PCWP 30 PAPi 0.56 Thermo CO 2.8 CI 1.5   Objective:   Weight Range:  Vital Signs:   Temp:  [97 F (36.1 C)-98.8 F (37.1 C)] 97.3 F (36.3 C) (05/07 0700) Pulse Rate:  [80-147] 127 (05/07 0700) Resp:  [10-33] 14 (05/07 0700) BP: (112-144)/(64-107) 112/78 (05/07 0000) SpO2:  [91 %-100 %] 97 % (05/07 0700) Last BM Date: 06/25/19  Weight change: Filed Weights   06/23/19 1859 06/24/19 0415 06/25/19 0422  Weight: 75.9 kg 76.2 kg 74.8 kg    Intake/Output:   Intake/Output Summary (Last 24 hours) at 06/26/2019 0731 Last data filed at 06/26/2019 0600 Gross per 24 hour  Intake 1339.23 ml  Output 1875 ml  Net -535.77 ml    CVP 25  Physical Exam: General:  Appears weak. . No resp difficulty HEENT: normal Neck: supple. JVP to jaw  Carotids 2+ bilat; no bruits. No lymphadenopathy or thryomegaly appreciated. Cor: PMI nondisplaced. Regular rate & rhythm. No rubs, or murmurs. +S3  Lungs: clear Abdomen: soft, nontender, nondistended. No hepatosplenomegaly. No bruits or masses. Good bowel sounds. Extremities: no cyanosis, clubbing, rash, edema Neuro: alert & orientedx3, cranial nerves grossly intact. moves all 4 extremities w/o difficulty. Affect flat  Telemetry: AF 100-115 Personally reviewed   Labs: Basic Metabolic Panel: Recent Labs  Lab 06/23/19 1235 06/23/19 1235 06/24/19 0526 06/24/19 0526 06/24/19 1712 06/25/19 0421 06/25/19 0735 06/25/19 2123 06/26/19 0511  NA 138   < > 137  --  141  142 138  --  136 135  K 4.7   < > 4.6  --  4.0  3.9 3.7  --  4.2 4.5  CL 105  --  107  --   --  105  --  103 105  CO2 22  --  20*   --   --  22  --  21* 21*  GLUCOSE 118*  --  115*  --   --  127*  --  142* 140*  BUN 19  --  25*  --   --  32*  --  32* 32*  CREATININE 1.75*  --  2.78*  --   --  2.97*  --  2.52* 2.58*  CALCIUM 9.4   < > 9.2   < >  --  8.7*  --  8.8* 8.5*  MG  --   --   --   --   --   --  1.7  --  2.4   < > = values in this interval not displayed.    Liver Function Tests: Recent Labs  Lab 06/22/19 1555 06/23/19 1235 06/24/19 0526 06/25/19 0421  AST 21 21 62* 55*  ALT 17 15 32 41  ALKPHOS 77 77 75 67  BILITOT 1.5* 1.6* 1.1 1.1  PROT 7.6 6.9 6.9 6.4*  ALBUMIN 4.2 3.8 3.8 3.3*   Recent Labs  Lab 06/22/19 1555 06/23/19 1235  LIPASE 29 24   No results for input(s): AMMONIA in the last 168 hours.  CBC: Recent Labs  Lab 06/22/19 1555  06/23/19 1235 06/24/19 1712 06/25/19 0421 06/26/19 0511  WBC 3.5* 5.2  --  7.4 8.5  NEUTROABS  --   --   --  5.8  --   HGB 11.7* 11.4* 12.6*  12.2* 11.3* 11.9*  HCT 36.9* 36.5* 37.0*  36.0* 35.0* 37.2*  MCV 95.6 96.6  --  94.9 95.4  PLT 180 179  --  200 227    Cardiac Enzymes: No results for input(s): CKTOTAL, CKMB, CKMBINDEX, TROPONINI in the last 168 hours.  BNP: BNP (last 3 results) Recent Labs    02/03/19 2007 06/01/19 1413 06/23/19 1527  BNP 922.2* 531.4* 613.2*    ProBNP (last 3 results) No results for input(s): PROBNP in the last 8760 hours.    Other results:  Imaging: IR Catheter Tube Change  Result Date: 06/24/2019 CLINICAL DATA:  The external portion of previously placed cholecystostomy catheter was inadvertently cut in half EXAM: [place]:  1.3 minutes; 71  uGym2 DAP TECHNIQUE: The cholecystostomy catheter fragment and surrounding skin were prepped with Betadine, draped in usual sterile fashion. Subcutaneous tissues infiltrated with 1% lidocaine. A small amount of contrast was injected through the nephrostomy catheter showing small contracted gallbladder  with stones. No free intraperitoneal spread. Contrast leaks back along the catheter tract. The catheter was exchanged over a 0.035" angiographic wire for a 5 [place] Kumpe catheter and additional injection performed confirming the above findings. The catheter was then exchanged over the wire for a new 10-French pigtail catheter, formed centrally within the contracted gallbladder lumen under fluoroscopy. Contrast injection confirms appropriate positioning. The catheter was secured externally with a Statlock devices and 0 Prolene suture. The patient tolerated the procedure well. COMPLICATIONS: None immediate IMPRESSION: 1. Technically successful exchange of cholecystostomy catheter under fluoroscopy Electronically Signed   By: Lucrezia Europe M.D.   On: 06/24/2019 12:16   CARDIAC CATHETERIZATION  Result Date: 06/24/2019 Findings: RA = 19 RV = 49/16 PA = 54/17 (34) PCW = 27 Fick cardiac output/index = 3.8/2.0 Thermo CO/CI = 4.7/2.5 PVR = 1.8 (Fick) FA sat = 99% PA sat = 54%, 60% Assessment: 1. Low output HF biventricular failure and marked volume overload Plan/Discussion: Start milrinone. Diuresis. Move to ICU for hemodynamic monitoring. Can upgrade to mechanical support as needed. Glori Bickers, MD 5:29 PM  DG CHEST PORT 1 VIEW  Result Date: 06/25/2019 CLINICAL DATA:  69 year old male with CHF. EXAM: PORTABLE CHEST 1 VIEW COMPARISON:  Chest radiograph dated 06/23/2019. FINDINGS: Right IJ Swan-Ganz with tip in the right middle or right lower lobe pulmonary artery. There is cardiomegaly with mild vascular congestion and probable mild edema. A small right pleural effusion is suspected. No pneumothorax. Left pectoral AICD device. Atherosclerotic calcification of the aorta. No acute osseous pathology. IMPRESSION: 1. Interval placement of a Swan-Ganz catheter. 2. Overall no significant interval change in the vascular congestion since the prior radiograph. Electronically Signed   By: Anner Crete M.D.   On: 06/25/2019  20:25     Medications:     Scheduled Medications: . atorvastatin  20 mg Oral Daily  . Chlorhexidine Gluconate Cloth  6 each Topical Daily  . furosemide  80 mg Intravenous BID  . pantoprazole  40 mg Oral Daily  . sodium chloride flush  10-40 mL Intracatheter Q12H  . sodium chloride flush  10-40 mL Intracatheter Q12H  . sodium chloride flush  3 mL Intravenous Once  . sodium chloride flush  3 mL Intravenous Q12H    Infusions: .  amiodarone 60 mg/hr (06/26/19 0600)  . heparin 1,250 Units/hr (06/26/19 0600)  . milrinone 0.125 mcg/kg/min (06/25/19 2127)    PRN Medications: acetaminophen **OR** acetaminophen, calcium carbonate, lidocaine (PF), ondansetron **OR** ondansetron (ZOFRAN) IV, sodium chloride flush, sodium chloride flush, traMADol   Assessment/Plan:   1. Acute on Chronic Combined Systolic and Diastolic Heart Failure -> cardiogenic shock - 2/2 NICM. HF dates back to 2015.  - Most recent echo 4/21 w/ EF <20. RV mildly reduced. GIIIDD. Moderate TR. No LVH.  - [facility] 10/2018 showed normal cors, elevated filling pressures and low CI at 1.9  - Now admitted w/ NYHA IV symptoms, volume overload and worsening renal function, likely cardiorenal syndrome. SCr now 2.97 (prior baseline 1.2). BP ok. No hypotension  - RHC 5/5 showed Low output biventricular failure and marked volume overload. PCWP 27. CI 2.0.  - Milrinone increased to 0.25 mcg on 5/6 but later cut back to 0.125 mcg due to A fib RVR.  - Todays CO-OX 51%. CVP 25. Swan numbers with persistently low cardiac index 1.5.  - Suspect he will need mechanical support. Discuss with Dr Haroldine Laws.    Dr Haroldine Laws discussed with Dr. [name] today to try to better understand if he feels this is truly an AF cardiomyopathy. In which case aggressive attempt at AF control (amio vs AVN ablation and CRT) may prove fruitful. If AF is secondary to his underlying HF then I fear we may not be successful as he is a poor candidate for advanced therapies  given an extensive h/o medication noncompliance.   2. AKI - due to shock/cardiorenal - Creatinine trending down 3>2.58   3. Persistent Atrial Fibrillation w/ RVR - Recent DCCV attempt aborted 4/20 due to LAA thrombi on TEE - current v-rates 90s-120s w/ limited rate/ rhythm control options. - use of digoxin limited due to AKI w/ SCr >2 - Continue amio drip 60 mg per hour.  - will avoid?blocker currently given low output and acute decompensation - may need to consider AV node ablation. He has a functioning single chamber ICD but may benefit from upgrade to CRT-D. - continue IV amio and IV heparin - risk of possible embolic event discussed if chemical cardioversion with IV amio. Unfortunately no other options   4. NSVT - in the setting of severe LV dysfunction and milrinone  - continue IV amiodarone  - he has ICD - keep K >4.0 and Mg >2.0   5. Chronic Cholecystitis  - has perc biliary drain, stable. AF. WBC WNL  - management per primary team/ surgery   Dr Haroldine Laws discussing with Dr [name]. Place IABP.     Length of Stay: 3   Amy Clegg NP-C  06/26/2019, 7:31 AM  Advanced Heart Failure Team Pager [phone] (M-F; 7a - 4p)  Please contact [facility] for night-coverage after hours (4p -7a ) and weekends on [email or web address]  Agree with above.  He struggled overnight with V sat into the 30s. Now 72s. CI 1.5 on Swan. Remains in AF with RVR despite IV amio. MV sat up to 51% today. Renal function slightly improved.   General:  Ill appearing. No resp difficulty HEENT: normal Neck: supple. RIJ swan. Carotids 2+ bilat; no bruits. No lymphadenopathy or thryomegaly appreciated. Cor: PMI nondisplaced. Regular rate & rhythm. No rubs, gallops or murmurs. Lungs: clear Abdomen: soft, nontender, nondistended. No hepatosplenomegaly. No bruits or masses. Good bowel sounds. Extremities: no cyanosis, clubbing, rash, edema Neuro: alert & orientedx3, cranial nerves grossly intact. moves all 4  extremities w/o difficulty. Affect pleasant  He is critically ill. Not tolerating AF well at all. Will need mechanical support followed by AV node ablation. Given PAPi < 1.0. Will try initital support with IABP followed by AV node ablation. If hemodynamics remain poor will step up to [place] 5.0 this afternoon. D/w Dr. [name].   CRITICAL CARE Performed by: Glori Bickers  Total critical care time: 35 minutes  Critical care time was exclusive of separately billable procedures and treating other patients.  Critical care was necessary to treat or prevent imminent or life-threatening deterioration.  Critical care was time spent personally by me (independent of midlevel providers or residents) on the following activities: development of treatment plan with patient and/or surrogate as well as nursing, discussions with consultants, evaluation of patient's response to treatment, examination of patient, obtaining history from patient or surrogate, ordering and performing treatments and interventions, ordering and review of laboratory studies, ordering and review of radiographic studies, pulse oximetry and re-evaluation of patient's condition.  Glori Bickers, MD  9:09 AM

## 2019-06-26 NOTE — Progress Notes (Signed)
[place] for Heparin Indication: atrial fibrillation, IABP  Allergies  Allergen Reactions  . Benadryl [Diphenhydramine] Palpitations    Patient Measurements: Height: 5\' 7"  (170.2 cm) Weight: 74.8 kg (164 lb 14.5 oz) IBW/kg (Calculated) : 66.1 Heparin Dosing Weight: 76.2 kg  Vital Signs: Temp: 97.2 F (36.2 C) (05/07 1220) BP: 145/64 (05/07 1138) Pulse Rate: 121 (05/07 1220)  Labs: Recent Labs     0000 06/24/19 0526 06/24/19 1712 06/24/19 1712 06/25/19 0421 06/25/19 1554 06/25/19 2123 06/26/19 0510 06/26/19 0511  HGB  --   --  12.6*  12.2*   < > 11.3*  --   --   --  11.9*  HCT  --   --  37.0*  36.0*  --  35.0*  --   --   --  37.2*  PLT  --   --   --   --  200  --   --   --  227  LABPROT  --  22.1*  --   --  20.2*  --   --   --   --   INR  --  2.0*  --   --  1.8*  --   --   --   --   HEPARINUNFRC  --   --   --   --  <0.10* <0.10*  --  0.32  --   CREATININE   < > 2.78*  --   --  2.97*  --  2.52*  --  2.58*   < > = values in this interval not displayed.    Estimated Creatinine Clearance: 26 mL/min (A) (by C-G formula based on SCr of 2.58 mg/dL (H)).   Medical History: Past Medical History:  Diagnosis Date  . Acute blood loss anemia   . Acute CVA (cerebrovascular accident) ([place])   . Acute deep vein thrombosis (DVT) of both lower extremities ([name])   . Acute kidney injury ([place])   . Acute on chronic combined systolic and diastolic CHF (congestive heart failure) ([place])   . Acute renal failure superimposed on stage 3a chronic kidney disease ([place])   . Acute respiratory failure with hypoxia ([place])   . Atrial fibrillation ([place])   . Carpal tunnel syndrome of right wrist 02/28/2018  . Cerebral edema ([place]) 11/13/2018  . Cerebral infarction ([place])   . CHF (congestive heart failure) ([place])   . Cholecystitis 02/04/2019  . Chronic right hip pain   . DCM (dilated cardiomyopathy) ([place])   . Dysphagia, post-stroke   . Elevated troponin   .  Entrapment of right ulnar nerve 02/28/2018  . Headache due to intracranial disease 11/14/2018  . Hepatitis C   . History of hemorrhagic stroke with residual hemiparesis ([place]) 02/04/2019  . HTN (hypertension) 08/14/2016  . Hyperlipidemia LDL goal <70 11/13/2018  . Hypertension   . ICD (implantable cardioverter-defibrillator) in place 09/13/2016  . Impotence due to erectile dysfunction 09/30/2017  . Ischemic cardiomyopathy   . Labile blood glucose   . Left leg DVT ([place]) 02/04/2019  . Marijuana user 11/13/2018  . Paroxysmal atrial fibrillation ([name])   . 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 Solitary pulmonary nodule 06/10/2017   5 mm RUL nodule noted incidentally as part of CVA workup 08/2016. With smoking history would obtain low-dose CT scan 08/2017.   . Stroke (cerebrum) ([name]) Lg L MCA infarct w/ hemorrhagic conversion, embolic d/t AF 2/37/6283  . Stroke ([place])   . Trochanteric bursitis, right  hip 11/14/2018  . Visit for monitoring Tikosyn therapy 03/26/2017    Assessment: Pt is a 69 yo M with atrial fibrillation on dabigatran PTA. Pt reports last dose was the morning of 06/23/19.   Pharmacy asked to switch to heparin.. CBC stable, no overt bleeding noted. Scr elevated 2.78 > 2.58.  Heparin was held briefly for IABP placement today. Restarted after placement.  Goal of Therapy:  Heparin level 0.2-0.5 Monitor platelets by anticoagulation protocol: Yes   Plan:  Continue IV Heparin at 1250 units/hr. Confirm heparin level in 8 hrs. -Daily heparin level and CBC.  [name], [facility] Clinical Pharmacist Phone [phone]  06/26/2019 1:04 PM

## 2019-06-26 NOTE — Progress Notes (Signed)
[place] for Heparin Indication: atrial fibrillation  Allergies  Allergen Reactions  . Benadryl [Diphenhydramine] Palpitations    Patient Measurements: Height: 5\' 7"  (170.2 cm) Weight: 74.8 kg (164 lb 14.5 oz) IBW/kg (Calculated) : 66.1 Heparin Dosing Weight: 76.2 kg  Vital Signs: Temp: 97.3 F (36.3 C) (05/07 0700) BP: 112/78 (05/07 0000) Pulse Rate: 127 (05/07 0700)  Labs: Recent Labs    06/23/19 1235 06/23/19 1235 06/24/19 0526 06/24/19 1712 06/24/19 1712 06/25/19 0421 06/25/19 1554 06/25/19 2123 06/26/19 0510 06/26/19 0511  HGB 11.4*   < >  --  12.6*  12.2*   < > 11.3*  --   --   --  11.9*  HCT 36.5*   < >  --  37.0*  36.0*  --  35.0*  --   --   --  37.2*  PLT 179  --   --   --   --  200  --   --   --  227  LABPROT 22.3*  --  22.1*  --   --  20.2*  --   --   --   --   INR 2.0*  --  2.0*  --   --  1.8*  --   --   --   --   HEPARINUNFRC  --   --   --   --   --  <0.10* <0.10*  --  0.32  --   CREATININE 1.75*   < > 2.78*  --   --  2.97*  --  2.52*  --  2.58*   < > = values in this interval not displayed.    Estimated Creatinine Clearance: 26 mL/min (A) (by C-G formula based on SCr of 2.58 mg/dL (H)).   Medical History: Past Medical History:  Diagnosis Date  . Acute blood loss anemia   . Acute CVA (cerebrovascular accident) ([place])   . Acute deep vein thrombosis (DVT) of both lower extremities ([name])   . Acute kidney injury ([place])   . Acute on chronic combined systolic and diastolic CHF (congestive heart failure) ([place])   . Acute renal failure superimposed on stage 3a chronic kidney disease ([place])   . Acute respiratory failure with hypoxia ([place])   . Atrial fibrillation ([place])   . Carpal tunnel syndrome of right wrist 02/28/2018  . Cerebral edema ([place]) 11/13/2018  . Cerebral infarction ([place])   . CHF (congestive heart failure) ([place])   . Cholecystitis 02/04/2019  . Chronic right hip pain   . DCM (dilated cardiomyopathy)  ([place])   . Dysphagia, post-stroke   . Elevated troponin   . Entrapment of right ulnar nerve 02/28/2018  . Headache due to intracranial disease 11/14/2018  . Hepatitis C   . History of hemorrhagic stroke with residual hemiparesis ([place]) 02/04/2019  . HTN (hypertension) 08/14/2016  . Hyperlipidemia LDL goal <70 11/13/2018  . Hypertension   . ICD (implantable cardioverter-defibrillator) in place 09/13/2016  . Impotence due to erectile dysfunction 09/30/2017  . Ischemic cardiomyopathy   . Labile blood glucose   . Left leg DVT ([place]) 02/04/2019  . Marijuana user 11/13/2018  . Paroxysmal atrial fibrillation ([name])   . 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 Solitary pulmonary nodule 06/10/2017   5 mm RUL nodule noted incidentally as part of CVA workup 08/2016. With smoking history would obtain low-dose CT scan 08/2017.   . Stroke (cerebrum) ([name]) Lg L MCA infarct w/ hemorrhagic conversion, embolic d/t  AF 09/05/2016  . Stroke ([place])   . Trochanteric bursitis, right hip 11/14/2018  . Visit for monitoring Tikosyn therapy 03/26/2017    Assessment: Pt is a 69 yo M with atrial fibrillation on dabigatran PTA. Pt reports last dose was the morning of 06/23/19.   Pharmacy asked to switch to heparin.. CBC stable, no overt bleeding noted. Scr elevated 2.78 > 2.58.   Goal of Therapy:  Heparin level 0.3-0.7 units/ml Monitor platelets by anticoagulation protocol: Yes   Plan:  Continue IV Heparin at 1250 units/hr. Confirm heparin level in 8 hrs. -Daily heparin level and CBC.  Glenn [name], [facility] Clinical Pharmacist Phone [phone]  06/26/2019 8:37 AM

## 2019-06-26 NOTE — Progress Notes (Signed)
Have been back on two occasions with Dr Reine Just to try and assess the correct time for undertaking AV ablation.  He underwent IABP placement with good augmentation with some slowing of the VR--initally very nauseated and Co-ox reassessed and unchanged at 50  Now he looks better,  BP 90's with augmentation, extremities warm to touch, albeit poor cap refill in his hands  Still with scant urine output, CI 1.5 or so.  Have decided to hold on AV ablation for now  My thought has been that control of the HR will be important for any chance of recovery but the timing of that procedure relates to 1) safety of the procedure itself which is probably sufficiently to do at anytime, 2) the potential for him to get worse with the immediate desynchronization of his LV contraction-- in this latter issue, the better and more stable he is prior to undertaking the safer  He and Dr Reine Just are agreeable to hold for right now

## 2019-06-26 NOTE — Progress Notes (Signed)
[place] for Heparin Indication: atrial fibrillation, IABP  Allergies  Allergen Reactions  . Benadryl [Diphenhydramine] Palpitations    Patient Measurements: Height: 5\' 7"  (170.2 cm) Weight: 74.8 kg (164 lb 14.5 oz) IBW/kg (Calculated) : 66.1 Heparin Dosing Weight: 76.2 kg  Vital Signs: Temp: 99.3 F (37.4 C) (05/07 2000) Temp Source: Oral (05/07 2000) BP: 145/64 (05/07 1138) Pulse Rate: 121 (05/07 2000)  Labs: Recent Labs     0000 06/24/19 0526 06/24/19 1712 06/24/19 1712 06/25/19 0421 06/25/19 0421 06/25/19 1554 06/25/19 2123 06/26/19 0510 06/26/19 0511 06/26/19 1759 06/26/19 2000  HGB  --   --  12.6*  12.2*   < > 11.3*  --   --   --   --  11.9*  --   --   HCT  --   --  37.0*  36.0*  --  35.0*  --   --   --   --  37.2*  --   --   PLT  --   --   --   --  200  --   --   --   --  227  --   --   LABPROT  --  22.1*  --   --  20.2*  --   --   --   --   --   --   --   INR  --  2.0*  --   --  1.8*  --   --   --   --   --   --   --   HEPARINUNFRC  --   --   --   --  <0.10*   < > <0.10*  --  0.32  --   --  0.46  CREATININE   < > 2.78*  --   --  2.97*   < >  --  2.52*  --  2.58* 2.74*  --    < > = values in this interval not displayed.    Estimated Creatinine Clearance: 24.5 mL/min (A) (by C-G formula based on SCr of 2.74 mg/dL (H)).   Medical History: Past Medical History:  Diagnosis Date  . Acute blood loss anemia   . Acute CVA (cerebrovascular accident) ([place])   . Acute deep vein thrombosis (DVT) of both lower extremities ([name])   . Acute kidney injury ([place])   . Acute on chronic combined systolic and diastolic CHF (congestive heart failure) ([place])   . Acute renal failure superimposed on stage 3a chronic kidney disease ([place])   . Acute respiratory failure with hypoxia ([place])   . Atrial fibrillation ([place])   . Carpal tunnel syndrome of right wrist 02/28/2018  . Cerebral edema ([place]) 11/13/2018  . Cerebral infarction ([place])   .  CHF (congestive heart failure) ([place])   . Cholecystitis 02/04/2019  . Chronic right hip pain   . DCM (dilated cardiomyopathy) ([place])   . Dysphagia, post-stroke   . Elevated troponin   . Entrapment of right ulnar nerve 02/28/2018  . Headache due to intracranial disease 11/14/2018  . Hepatitis C   . History of hemorrhagic stroke with residual hemiparesis ([place]) 02/04/2019  . HTN (hypertension) 08/14/2016  . Hyperlipidemia LDL goal <70 11/13/2018  . Hypertension   . ICD (implantable cardioverter-defibrillator) in place 09/13/2016  . Impotence due to erectile dysfunction 09/30/2017  . Ischemic cardiomyopathy   . Labile blood glucose   . Left leg DVT ([place]) 02/04/2019  . Marijuana user 11/13/2018  .  Paroxysmal atrial fibrillation ([name])   . 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 Solitary pulmonary nodule 06/10/2017   5 mm RUL nodule noted incidentally as part of CVA workup 08/2016. With smoking history would obtain low-dose CT scan 08/2017.   . Stroke (cerebrum) ([name]) Lg L MCA infarct w/ hemorrhagic conversion, embolic d/t AF 7/35/3299  . Stroke ([place])   . Trochanteric bursitis, right hip 11/14/2018  . Visit for monitoring Tikosyn therapy 03/26/2017    Assessment: Pt is a 69 yo M with atrial fibrillation on dabigatran PTA. Pt reports last dose was the morning of 06/23/19.   Pharmacy asked to switch to heparin.. CBC stable, no overt bleeding noted. Scr elevated 2.78 > 2.58.  Heparin was held briefly for IABP placement today. Restarted after placement and heparin level now therapeutic at 0.46  Goal of Therapy:  Heparin level 0.2-0.5 Monitor platelets by anticoagulation protocol: Yes   Plan:  Continue IV Heparin at 1250 units/hr. -Daily heparin level and CBC.  Thank you Anette Guarneri, PharmD  06/26/2019 9:12 PM

## 2019-06-26 NOTE — Interval H&P Note (Signed)
History and Physical Interval Note:  06/26/2019 11:02 AM  Glenn [name]  has presented today for surgery, with the diagnosis of Shock.  The various methods of treatment have been discussed with the patient and family. After consideration of risks, benefits and other options for treatment, the patient has consented to  Procedure(s): IABP INSERTION (N/A) as a surgical intervention.  The patient's history has been reviewed, patient examined, no change in status, stable for surgery.  I have reviewed the patient's chart and labs.  Questions were answered to the patient's satisfaction.     Sherina Stammer

## 2019-06-26 NOTE — Progress Notes (Signed)
FPTS Social Note   The FTPS appreciates critical care's management for Glenn [name]. We will continue to watch this patient's status and resume the role of primary team once the patient is appropriate for the floor.   [name], MD  PGY-1, [place] Intern Pager [phone]

## 2019-06-27 DIAGNOSIS — R570 Cardiogenic shock: Secondary | ICD-10-CM | POA: Diagnosis not present

## 2019-06-27 DIAGNOSIS — I4891 Unspecified atrial fibrillation: Secondary | ICD-10-CM | POA: Diagnosis not present

## 2019-06-27 DIAGNOSIS — N170 Acute kidney failure with tubular necrosis: Secondary | ICD-10-CM | POA: Diagnosis not present

## 2019-06-27 DIAGNOSIS — I5043 Acute on chronic combined systolic (congestive) and diastolic (congestive) heart failure: Secondary | ICD-10-CM | POA: Diagnosis not present

## 2019-06-27 LAB — BASIC METABOLIC PANEL
Anion gap: 10 (ref 5–15)
BUN: 31 mg/dL — ABNORMAL HIGH (ref 8–23)
CO2: 24 mmol/L (ref 22–32)
Calcium: 8.6 mg/dL — ABNORMAL LOW (ref 8.9–10.3)
Chloride: 102 mmol/L (ref 98–111)
Creatinine, Ser: 2.47 mg/dL — ABNORMAL HIGH (ref 0.61–1.24)
GFR calc Af Amer: 30 mL/min — ABNORMAL LOW (ref >60)
GFR calc non Af Amer: 26 mL/min — ABNORMAL LOW (ref >60)
Glucose, Bld: 122 mg/dL — ABNORMAL HIGH (ref 70–99)
Potassium: 3.6 mmol/L (ref 3.5–5.1)
Sodium: 136 mmol/L (ref 135–145)

## 2019-06-27 LAB — COOXEMETRY PANEL
Carboxyhemoglobin: 2 % — ABNORMAL HIGH (ref 0.5–1.5)
Methemoglobin: 0.8 % (ref 0.0–1.5)
O2 Saturation: 57.8 %
Total hemoglobin: 12 g/dL (ref 12.0–16.0)

## 2019-06-27 LAB — CBC
HCT: 34.9 % — ABNORMAL LOW (ref 39.0–52.0)
Hemoglobin: 11.2 g/dL — ABNORMAL LOW (ref 13.0–17.0)
MCH: 30.1 pg (ref 26.0–34.0)
MCHC: 32.1 g/dL (ref 30.0–36.0)
MCV: 93.8 fL (ref 80.0–100.0)
Platelets: 191 10*3/uL (ref 150–400)
RBC: 3.72 MIL/uL — ABNORMAL LOW (ref 4.22–5.81)
RDW: 14 % (ref 11.5–15.5)
WBC: 7.6 10*3/uL (ref 4.0–10.5)
nRBC: 0 % (ref 0.0–0.2)

## 2019-06-27 LAB — HEPARIN LEVEL (UNFRACTIONATED): Heparin Unfractionated: 0.27 IU/mL — ABNORMAL LOW (ref 0.30–0.70)

## 2019-06-27 MED ORDER — POTASSIUM CHLORIDE CRYS ER 20 MEQ PO TBCR
40.0000 meq | EXTENDED_RELEASE_TABLET | Freq: Two times a day (BID) | ORAL | Status: DC
  Administered 2019-06-27 – 2019-06-29 (×6): 40 meq via ORAL
  Filled 2019-06-27 (×6): qty 2

## 2019-06-27 MED ORDER — SPIRONOLACTONE 12.5 MG HALF TABLET
12.5000 mg | ORAL_TABLET | Freq: Every day | ORAL | Status: DC
  Administered 2019-06-27 – 2019-06-29 (×3): 12.5 mg via ORAL
  Filled 2019-06-27 (×3): qty 1

## 2019-06-27 NOTE — Progress Notes (Signed)
Progress Note  Patient Name: Glenn [name] Date of Encounter: 06/27/2019  Primary Cardiologist: Virl Axe, MD   Subjective   No chest pain. "I feel a little better."  Inpatient Medications    Scheduled Meds: . atorvastatin  20 mg Oral Daily  . Chlorhexidine Gluconate Cloth  6 each Topical Daily  . pantoprazole  40 mg Oral Daily  . sodium chloride flush  10-40 mL Intracatheter Q12H  . sodium chloride flush  10-40 mL Intracatheter Q12H  . sodium chloride flush  3 mL Intravenous Once  . sodium chloride flush  3 mL Intravenous Q12H  . sodium chloride flush  3 mL Intravenous Q12H   Continuous Infusions: . amiodarone 60 mg/hr (06/27/19 0833)  . furosemide (LASIX) infusion 20 mg/hr (06/27/19 0800)  . heparin 1,250 Units/hr (06/27/19 0800)  . milrinone 0.125 mcg/kg/min (06/27/19 0800)  . norepinephrine (LEVOPHED) Adult infusion 2 mcg/min (06/27/19 0800)   PRN Meds: acetaminophen **OR** acetaminophen, calcium carbonate, lidocaine (PF), ondansetron **OR** ondansetron (ZOFRAN) IV, sodium chloride flush, sodium chloride flush, traMADol   Vital Signs    Vitals:   06/27/19 0745 06/27/19 0800 06/27/19 0815 06/27/19 0830  BP:      Pulse: (!) 112 (!) 115 (!) 104 97  Resp: 17 17    Temp: 98.2 F (36.8 C) 98.4 F (36.9 C) 98.2 F (36.8 C) 98.2 F (36.8 C)  TempSrc:  Core    SpO2: 98% 100% 100% 97%  Weight:      Height:        Intake/Output Summary (Last 24 hours) at 06/27/2019 5784 Last data filed at 06/27/2019 0800 Gross per 24 hour  Intake 1418.14 ml  Output 1150 ml  Net 268.14 ml   Filed Weights   06/24/19 0415 06/25/19 0422 06/27/19 0500  Weight: 76.2 kg 74.8 kg 78.5 kg    Telemetry    Atrial fib with a CVR/RVR - Personally Reviewed  ECG    noen - Personally Reviewed  Physical Exam   GEN: No acute distress.   Neck: No JVD Cardiac: IRIRR, no murmurs, rubs, or gallops.  Respiratory: Clear to auscultation bilaterally with rare basilar rales. GI: Soft,  nontender, non-distended  MS: No edema; No deformity. Neuro:  Nonfocal  Psych: Normal affect   Labs    Chemistry Recent Labs  Lab 06/24/19 0526 06/24/19 1712 06/25/19 0421 06/25/19 2123 06/26/19 0511 06/26/19 1759 06/27/19 0500  NA 137   < > 138   < > 135 135 136  K 4.6   < > 3.7   < > 4.5 4.0 3.6  CL 107  --  105   < > 105 101 102  CO2 20*  --  22   < > 21* 20* 24  GLUCOSE 115*  --  127*   < > 140* 121* 122*  BUN 25*  --  32*   < > 32* 35* 31*  CREATININE 2.78*  --  2.97*   < > 2.58* 2.74* 2.47*  CALCIUM 9.2  --  8.7*   < > 8.5* 8.4* 8.6*  PROT 6.9  --  6.4*  --   --  6.7  --   ALBUMIN 3.8  --  3.3*  --   --  3.3*  --   AST 62*  --  55*  --   --  42*  --   ALT 32  --  41  --   --  39  --   ALKPHOS 75  --  67  --   --  70  --   BILITOT 1.1  --  1.1  --   --  1.2  --   GFRNONAA 23*  --  21*   < > 25* 23* 26*  GFRAA 26*  --  24*   < > 29* 27* 30*  ANIONGAP 10  --  11   < > 9 14 10    < > = values in this interval not displayed.     Hematology Recent Labs  Lab 06/25/19 0421 06/26/19 0511 06/27/19 0500  WBC 7.4 8.5 7.6  RBC 3.69* 3.90* 3.72*  HGB 11.3* 11.9* 11.2*  HCT 35.0* 37.2* 34.9*  MCV 94.9 95.4 93.8  MCH 30.6 30.5 30.1  MCHC 32.3 32.0 32.1  RDW 14.2 14.1 14.0  PLT 200 227 191    Cardiac EnzymesNo results for input(s): TROPONINI in the last 168 hours. No results for input(s): TROPIPOC in the last 168 hours.   BNP Recent Labs  Lab 06/23/19 1527  BNP 613.2*     DDimer No results for input(s): DDIMER in the last 168 hours.   Radiology    CARDIAC CATHETERIZATION  Result Date: 06/26/2019 Successful IABP placement.   DG CHEST PORT 1 VIEW  Result Date: 06/25/2019 CLINICAL DATA:  69 year old male with CHF. EXAM: PORTABLE CHEST 1 VIEW COMPARISON:  Chest radiograph dated 06/23/2019. FINDINGS: Right IJ Swan-Ganz with tip in the right middle or right lower lobe pulmonary artery. There is cardiomegaly with mild vascular congestion and probable mild edema. A  small right pleural effusion is suspected. No pneumothorax. Left pectoral AICD device. Atherosclerotic calcification of the aorta. No acute osseous pathology. IMPRESSION: 1. Interval placement of a Swan-Ganz catheter. 2. Overall no significant interval change in the vascular congestion since the prior radiograph. Electronically Signed   By: Anner Crete M.D.   On: 06/25/2019 20:25    Cardiac Studies   Co-ox - 37  Patient Profile     69 y.o. male admitted with shock, atrial fib with a RVR  Assessment & Plan    1. Atrial fib - his VR is better today. Discussed with Dr. Reine Just. I would avoid AV node ablation if at all possible unless rates cannot be controlled. Would consider adding an LV lead first. If a poor result or unable to get a narrow paced QRS, ablating his AV node should really be last resort as dysynchronous pacing will make his management even more difficult. Could consider judicious digoxin use to augment rate control with amio.  2. Acute on chronic systolic heart failure - his drips are being managed by Dr. Reine Just and team.   For questions or updates, please contact [place] Please consult [email or web address] for contact info under Cardiology/STEMI.      Signed, Cristopher Peru, MD  06/27/2019, 9:07 AM  Patient ID: Glenn [name], male   DOB: August 04, 1950, 70 y.o.   MRN: [id number]

## 2019-06-27 NOTE — Progress Notes (Signed)
Advanced Heart Failure Rounding Note   Subjective:    IABP placed on 5/6. Remains on milrinone 0.125. NE and lasix gtt started last night.   Remains on amio gtt.   Feeling better this am. No further nausea. Says he is hungry. Starting to diurese. Creatinine slightly improved. Co-ox 58%  IABP 1:1 augmenting well  CVP 16 PA 44/20 PAPi 1.5 Thermo CO 3.2 CI 1.7   Objective:   Weight Range:  Vital Signs:   Temp:  [97.2 F (36.2 C)-99.7 F (37.6 C)] 97.9 F (36.6 C) (05/08 1230) Pulse Rate:  [91-130] 107 (05/08 1230) Resp:  [13-27] 18 (05/08 1230) SpO2:  [91 %-100 %] 100 % (05/08 1230) Weight:  [78.5 kg] 78.5 kg (05/08 0500) Last BM Date: 06/25/19  Weight change: Filed Weights   06/24/19 0415 06/25/19 0422 06/27/19 0500  Weight: 76.2 kg 74.8 kg 78.5 kg    Intake/Output:   Intake/Output Summary (Last 24 hours) at 06/27/2019 1251 Last data filed at 06/27/2019 1200 Gross per 24 hour  Intake 1832.68 ml  Output 1990 ml  Net -157.32 ml     Physical Exam: General:  Lying in bed. No resp difficulty HEENT: normal Neck: supple. RIJ swan Carotids 2+ bilat; no bruits. No lymphadenopathy or thryomegaly appreciated. Cor: PMI nondisplaced. Irr + s3 Lungs: clear Abdomen: soft, nontender, nondistended. No hepatosplenomegaly. No bruits or masses. Good bowel sounds. + chole bag Extremities: no cyanosis, clubbing, rash, edema IAP site ok Neuro: alert & orientedx3, cranial nerves grossly intact. moves all 4 extremities w/o difficulty. Affect pleasant  Telemetry: AF 80-100 Personally reviewed   Labs: Basic Metabolic Panel: Recent Labs  Lab 06/25/19 0421 06/25/19 0421 06/25/19 0735 06/25/19 2123 06/25/19 2123 06/26/19 0511 06/26/19 1759 06/27/19 0500  NA 138  --   --  136  --  135 135 136  K 3.7  --   --  4.2  --  4.5 4.0 3.6  CL 105  --   --  103  --  105 101 102  CO2 22  --   --  21*  --  21* 20* 24  GLUCOSE 127*  --   --  142*  --  140* 121* 122*  BUN 32*  --    --  32*  --  32* 35* 31*  CREATININE 2.97*  --   --  2.52*  --  2.58* 2.74* 2.47*  CALCIUM 8.7*   < >  --  8.8*   < > 8.5* 8.4* 8.6*  MG  --   --  1.7  --   --  2.4  --   --    < > = values in this interval not displayed.    Liver Function Tests: Recent Labs  Lab 06/22/19 1555 06/23/19 1235 06/24/19 0526 06/25/19 0421 06/26/19 1759  AST 21 21 62* 55* 42*  ALT 17 15 32 41 39  ALKPHOS 77 77 75 67 70  BILITOT 1.5* 1.6* 1.1 1.1 1.2  PROT 7.6 6.9 6.9 6.4* 6.7  ALBUMIN 4.2 3.8 3.8 3.3* 3.3*   Recent Labs  Lab 06/22/19 1555 06/23/19 1235  LIPASE 29 24   No results for input(s): AMMONIA in the last 168 hours.  CBC: Recent Labs  Lab 06/22/19 1555 06/22/19 1555 06/23/19 1235 06/24/19 1712 06/25/19 0421 06/26/19 0511 06/27/19 0500  WBC 3.5*  --  5.2  --  7.4 8.5 7.6  NEUTROABS  --   --   --   --  5.8  --   --   HGB 11.7*   < > 11.4* 12.6*  12.2* 11.3* 11.9* 11.2*  HCT 36.9*   < > 36.5* 37.0*  36.0* 35.0* 37.2* 34.9*  MCV 95.6  --  96.6  --  94.9 95.4 93.8  PLT 180  --  179  --  200 227 191   < > = values in this interval not displayed.    Cardiac Enzymes: No results for input(s): CKTOTAL, CKMB, CKMBINDEX, TROPONINI in the last 168 hours.  BNP: BNP (last 3 results) Recent Labs    02/03/19 2007 06/01/19 1413 06/23/19 1527  BNP 922.2* 531.4* 613.2*    ProBNP (last 3 results) No results for input(s): PROBNP in the last 8760 hours.    Other results:  Imaging: CARDIAC CATHETERIZATION  Result Date: 06/26/2019 Successful IABP placement.   DG CHEST PORT 1 VIEW  Result Date: 06/25/2019 CLINICAL DATA:  69 year old male with CHF. EXAM: PORTABLE CHEST 1 VIEW COMPARISON:  Chest radiograph dated 06/23/2019. FINDINGS: Right IJ Swan-Ganz with tip in the right middle or right lower lobe pulmonary artery. There is cardiomegaly with mild vascular congestion and probable mild edema. A small right pleural effusion is suspected. No pneumothorax. Left pectoral AICD device.  Atherosclerotic calcification of the aorta. No acute osseous pathology. IMPRESSION: 1. Interval placement of a Swan-Ganz catheter. 2. Overall no significant interval change in the vascular congestion since the prior radiograph. Electronically Signed   By: Anner Crete M.D.   On: 06/25/2019 20:25     Medications:     Scheduled Medications: . atorvastatin  20 mg Oral Daily  . Chlorhexidine Gluconate Cloth  6 each Topical Daily  . pantoprazole  40 mg Oral Daily  . sodium chloride flush  10-40 mL Intracatheter Q12H  . sodium chloride flush  10-40 mL Intracatheter Q12H  . sodium chloride flush  3 mL Intravenous Once  . sodium chloride flush  3 mL Intravenous Q12H  . sodium chloride flush  3 mL Intravenous Q12H    Infusions: . amiodarone 60 mg/hr (06/27/19 1200)  . furosemide (LASIX) infusion 20 mg/hr (06/27/19 1223)  . heparin 1,250 Units/hr (06/27/19 1200)  . milrinone 0.125 mcg/kg/min (06/27/19 1200)  . norepinephrine (LEVOPHED) Adult infusion 1 mcg/min (06/27/19 1200)    PRN Medications: acetaminophen **OR** acetaminophen, calcium carbonate, lidocaine (PF), ondansetron **OR** ondansetron (ZOFRAN) IV, sodium chloride flush, sodium chloride flush, traMADol   Assessment/Plan:   1. Acute on Chronic Combined Systolic and Diastolic Heart Failure -> cardiogenic shock - 2/2 NICM. HF dates back to 2015.  - Most recent echo 4/21 w/ EF <20. RV mildly reduced. GIIIDD. Moderate TR. No LVH.  - [facility] 10/2018 showed normal cors, elevated filling pressures and low CI at 1.9  - Now admitted w/ NYHA IV symptoms, volume overload and worsening renal function, likely cardiorenal syndrome. SCr now 2.97 (prior baseline 1.2). BP ok. No hypotension  - RHC 5/5 showed Low output biventricular failure and marked volume overload. PCWP 27. CI 2.0.  - Milrinone increased to 0.25 mcg on 5/6 but later cut back to 0.125 mcg due to A fib RVR.  - IABP placed 5/7. NE added - On lasix gtt at 20 - Has responded  well to IABP and low-dose NE but output still low - Increase NE to 3 - Continue diuresis - He is a poor candidate for advanced therapies given an extensive h/o medication noncompliance.   2. AKI - due to shock/cardiorenal - Creatinine trending down 3>2.58 >  2.47  3. Persistent Atrial Fibrillation w/ RVR - Recent DCCV attempt aborted 4/20 due to LAA thrombi on TEE -  limited rate/ rhythm control options. - use of digoxin limited due to AKI w/ SCr >2 - Continue amio drip 60 mg per hour. C rates improved -> now 80-100 - will avoid?blocker currently given low output and acute decompensation - EP following for potential AV node ablation but ideally would be able to upgrade to CRT first - risk of possible embolic event discussed if chemical cardioversion with IV amio. Unfortunately no other options   4. NSVT - in the setting of severe LV dysfunction and milrinone  - continue IV amiodarone  - he has ICD - keep K >4.0 and Mg >2.0   5. Chronic Cholecystitis  - has perc biliary drain, stable. AF. WBC WNL  - management per primary team/ surgery   6. Hypokalemia - will supp  CRITICAL CARE Performed by: Glori Bickers  Total critical care time: 35 minutes  Critical care time was exclusive of separately billable procedures and treating other patients.  Critical care was necessary to treat or prevent imminent or life-threatening deterioration.  Critical care was time spent personally by me (independent of midlevel providers or residents) on the following activities: development of treatment plan with patient and/or surrogate as well as nursing, discussions with consultants, evaluation of patient's response to treatment, examination of patient, obtaining history from patient or surrogate, ordering and performing treatments and interventions, ordering and review of laboratory studies, ordering and review of radiographic studies, pulse oximetry and re-evaluation of patient's  condition.   Length of Stay: 4   Glori Bickers MD 06/27/2019, 12:51 PM  Advanced Heart Failure Team Pager [phone] (M-F; [place])  Please contact [facility] for night-coverage after hours (4p -7a ) and weekends on [email or web address]

## 2019-06-27 NOTE — Progress Notes (Addendum)
Family medicine PCP Social note  Brief encounter with Mr. Benish today.  He states that he is feeling better from yesterday.  His main complaint is right neck soreness associated with his central line.  He appears to be in good spirits although much less enthusiastic I have seen him at his various clinic visits.  I appreciate Dr. Haroldine Laws and the cardiology team's treatment of Mr. Helderman and his very complex cardiac issues.   Family medicine teaching service will continue to follow along and available as needed if/when patient is appropriate for floor.  [name] MD PGY-3 Family Medicine Resident

## 2019-06-27 NOTE — Progress Notes (Signed)
[place] for Heparin Indication: atrial fibrillation, IABP  Allergies  Allergen Reactions  . Benadryl [Diphenhydramine] Palpitations    Patient Measurements: Height: 5\' 7"  (170.2 cm) Weight: 78.5 kg (173 lb 1 oz) IBW/kg (Calculated) : 66.1 Heparin Dosing Weight: 76.2 kg  Vital Signs: Temp: 98.2 F (36.8 C) (05/08 0930) Temp Source: Core (05/08 0800) Pulse Rate: 98 (05/08 0930)  Labs: Recent Labs    06/25/19 0421 06/25/19 0421 06/25/19 1554 06/26/19 0510 06/26/19 0511 06/26/19 1759 06/26/19 2000 06/27/19 0500  HGB 11.3*   < >  --   --  11.9*  --   --  11.2*  HCT 35.0*  --   --   --  37.2*  --   --  34.9*  PLT 200  --   --   --  227  --   --  191  LABPROT 20.2*  --   --   --   --   --   --   --   INR 1.8*  --   --   --   --   --   --   --   HEPARINUNFRC <0.10*  --    < > 0.32  --   --  0.46 0.27*  CREATININE 2.97*   < >   < >  --  2.58* 2.74*  --  2.47*   < > = values in this interval not displayed.    Estimated Creatinine Clearance: 27.1 mL/min (A) (by C-G formula based on SCr of 2.47 mg/dL (H)).   Medical History: Past Medical History:  Diagnosis Date  . Acute blood loss anemia   . Acute CVA (cerebrovascular accident) ([place])   . Acute deep vein thrombosis (DVT) of both lower extremities ([name])   . Acute kidney injury ([place])   . Acute on chronic combined systolic and diastolic CHF (congestive heart failure) ([place])   . Acute renal failure superimposed on stage 3a chronic kidney disease ([place])   . Acute respiratory failure with hypoxia ([place])   . Atrial fibrillation ([place])   . Carpal tunnel syndrome of right wrist 02/28/2018  . Cerebral edema ([place]) 11/13/2018  . Cerebral infarction ([place])   . CHF (congestive heart failure) ([place])   . Cholecystitis 02/04/2019  . Chronic right hip pain   . DCM (dilated cardiomyopathy) ([place])   . Dysphagia, post-stroke   . Elevated troponin   . Entrapment of right ulnar nerve 02/28/2018  .  Headache due to intracranial disease 11/14/2018  . Hepatitis C   . History of hemorrhagic stroke with residual hemiparesis ([place]) 02/04/2019  . HTN (hypertension) 08/14/2016  . Hyperlipidemia LDL goal <70 11/13/2018  . Hypertension   . ICD (implantable cardioverter-defibrillator) in place 09/13/2016  . Impotence due to erectile dysfunction 09/30/2017  . Ischemic cardiomyopathy   . Labile blood glucose   . Left leg DVT ([place]) 02/04/2019  . Marijuana user 11/13/2018  . Paroxysmal atrial fibrillation ([name])   . Right middle cerebral artery stroke ([place]) 11/13/2018  . Solitary pulmonary nodule 06/10/2017   5 mm RUL nodule noted incidentally as part of CVA workup 08/2016. With smoking history would obtain low-dose CT scan 08/2017.   . Stroke (cerebrum) ([name]) Lg L MCA infarct w/ hemorrhagic conversion, embolic d/t AF 0/62/6948  . Stroke ([place])   . Trochanteric bursitis, right hip 11/14/2018  . Visit for monitoring Tikosyn therapy 03/26/2017    Assessment: Pt is a 69 yo M with atrial fibrillation on  dabigatran PTA. Pt reports last dose was the morning of 06/23/19.   Pharmacy asked to switch to heparin. CBC stable, no overt bleeding noted. Scr elevated 2.47.  Heparin was held briefly for IABP placement 5/7. Restarted after placement. Heparin level at goal this am 0.27.   Goal of Therapy:  Heparin level 0.2-0.5 Monitor platelets by anticoagulation protocol: Yes   Plan:  Continue IV Heparin at 1250 units/hr. -Daily heparin level and CBC.  Erin Hearing PharmD., BCPS Clinical Pharmacist 06/27/2019 10:30 AM

## 2019-06-28 DIAGNOSIS — R570 Cardiogenic shock: Secondary | ICD-10-CM | POA: Diagnosis not present

## 2019-06-28 DIAGNOSIS — I4891 Unspecified atrial fibrillation: Secondary | ICD-10-CM | POA: Diagnosis not present

## 2019-06-28 DIAGNOSIS — N170 Acute kidney failure with tubular necrosis: Secondary | ICD-10-CM | POA: Diagnosis not present

## 2019-06-28 DIAGNOSIS — I5043 Acute on chronic combined systolic (congestive) and diastolic (congestive) heart failure: Secondary | ICD-10-CM | POA: Diagnosis not present

## 2019-06-28 LAB — BASIC METABOLIC PANEL
Anion gap: 12 (ref 5–15)
BUN: 26 mg/dL — ABNORMAL HIGH (ref 8–23)
CO2: 24 mmol/L (ref 22–32)
Calcium: 8.4 mg/dL — ABNORMAL LOW (ref 8.9–10.3)
Chloride: 97 mmol/L — ABNORMAL LOW (ref 98–111)
Creatinine, Ser: 2.12 mg/dL — ABNORMAL HIGH (ref 0.61–1.24)
GFR calc Af Amer: 36 mL/min — ABNORMAL LOW (ref >60)
GFR calc non Af Amer: 31 mL/min — ABNORMAL LOW (ref >60)
Glucose, Bld: 135 mg/dL — ABNORMAL HIGH (ref 70–99)
Potassium: 3.8 mmol/L (ref 3.5–5.1)
Sodium: 133 mmol/L — ABNORMAL LOW (ref 135–145)

## 2019-06-28 LAB — COOXEMETRY PANEL
Carboxyhemoglobin: 1.9 % — ABNORMAL HIGH (ref 0.5–1.5)
Methemoglobin: 0.6 % (ref 0.0–1.5)
O2 Saturation: 55.7 %
Total hemoglobin: 11.8 g/dL — ABNORMAL LOW (ref 12.0–16.0)

## 2019-06-28 LAB — CBC
HCT: 34.3 % — ABNORMAL LOW (ref 39.0–52.0)
Hemoglobin: 11 g/dL — ABNORMAL LOW (ref 13.0–17.0)
MCH: 29.7 pg (ref 26.0–34.0)
MCHC: 32.1 g/dL (ref 30.0–36.0)
MCV: 92.7 fL (ref 80.0–100.0)
Platelets: 161 10*3/uL (ref 150–400)
RBC: 3.7 MIL/uL — ABNORMAL LOW (ref 4.22–5.81)
RDW: 13.9 % (ref 11.5–15.5)
WBC: 6.8 10*3/uL (ref 4.0–10.5)
nRBC: 0 % (ref 0.0–0.2)

## 2019-06-28 LAB — HEPARIN LEVEL (UNFRACTIONATED): Heparin Unfractionated: 0.32 IU/mL (ref 0.30–0.70)

## 2019-06-28 MED ORDER — POTASSIUM CHLORIDE CRYS ER 20 MEQ PO TBCR: 40.0000 meq | EXTENDED_RELEASE_TABLET | Freq: Once | ORAL | Status: DC

## 2019-06-28 MED ORDER — POTASSIUM CHLORIDE CRYS ER 20 MEQ PO TBCR
40.0000 meq | EXTENDED_RELEASE_TABLET | Freq: Once | ORAL | Status: AC
  Administered 2019-06-28: 40 meq via ORAL
  Filled 2019-06-28: qty 2

## 2019-06-28 MED ORDER — METOLAZONE 5 MG PO TABS
5.0000 mg | ORAL_TABLET | Freq: Once | ORAL | Status: AC
  Administered 2019-06-28: 5 mg via ORAL
  Filled 2019-06-28: qty 1

## 2019-06-28 MED ORDER — SPIRONOLACTONE 12.5 MG HALF TABLET: 12.5000 mg | ORAL_TABLET | Freq: Every day | ORAL | Status: DC

## 2019-06-28 NOTE — Progress Notes (Signed)
[facility] for Heparin Indication: atrial fibrillation, IABP  Allergies  Allergen Reactions  . Benadryl [Diphenhydramine] Palpitations    Patient Measurements: Height: 5\' 7"  (170.2 cm) Weight: 78.2 kg (172 lb 6.4 oz) IBW/kg (Calculated) : 66.1 Heparin Dosing Weight: 76.2 kg  Vital Signs: Temp: 97.7 F (36.5 C) (05/09 0815) Temp Source: Core (05/09 0800) Pulse Rate: 110 (05/09 0815)  Labs: Recent Labs    06/26/19 0510 06/26/19 0511 06/26/19 0511 06/26/19 1759 06/26/19 2000 06/27/19 0500 06/28/19 0601  HGB  --  11.9*   < >  --   --  11.2* 11.0*  HCT  --  37.2*  --   --   --  34.9* 34.3*  PLT  --  227  --   --   --  191 161  HEPARINUNFRC   < >  --   --   --  0.46 0.27* 0.32  CREATININE  --  2.58*   < > 2.74*  --  2.47* 2.12*   < > = values in this interval not displayed.    Estimated Creatinine Clearance: 31.6 mL/min (A) (by C-G formula based on SCr of 2.12 mg/dL (H)).   Medical History: Past Medical History:  Diagnosis Date  . Acute blood loss anemia   . Acute CVA (cerebrovascular accident) ([place])   . Acute deep vein thrombosis (DVT) of both lower extremities ([name])   . Acute kidney injury ([place])   . Acute on chronic combined systolic and diastolic CHF (congestive heart failure) ([place])   . Acute renal failure superimposed on stage 3a chronic kidney disease ([place])   . Acute respiratory failure with hypoxia ([place])   . Atrial fibrillation ([place])   . Carpal tunnel syndrome of right wrist 02/28/2018  . Cerebral edema ([place]) 11/13/2018  . Cerebral infarction ([place])   . CHF (congestive heart failure) ([place])   . Cholecystitis 02/04/2019  . Chronic right hip pain   . DCM (dilated cardiomyopathy) ([place])   . Dysphagia, post-stroke   . Elevated troponin   . Entrapment of right ulnar nerve 02/28/2018  . Headache due to intracranial disease 11/14/2018  . Hepatitis C   . History of hemorrhagic stroke with residual hemiparesis ([place]) 02/04/2019  .  HTN (hypertension) 08/14/2016  . Hyperlipidemia LDL goal <70 11/13/2018  . Hypertension   . ICD (implantable cardioverter-defibrillator) in place 09/13/2016  . Impotence due to erectile dysfunction 09/30/2017  . Ischemic cardiomyopathy   . Labile blood glucose   . Left leg DVT ([place]) 02/04/2019  . Marijuana user 11/13/2018  . Paroxysmal atrial fibrillation ([name])   . Right middle cerebral artery stroke ([place]) 11/13/2018  . Solitary pulmonary nodule 06/10/2017   5 mm RUL nodule noted incidentally as part of CVA workup 08/2016. With smoking history would obtain low-dose CT scan 08/2017.   . Stroke (cerebrum) ([name]) Lg L MCA infarct w/ hemorrhagic conversion, embolic d/t AF 1/93/7902  . Stroke ([place])   . Trochanteric bursitis, right hip 11/14/2018  . Visit for monitoring Tikosyn therapy 03/26/2017    Assessment: Pt is a 69 yo M with atrial fibrillation on dabigatran PTA. Pt reports last dose was the morning of 06/23/19.   Pharmacy asked to switch to heparin. CBC stable, no overt bleeding noted. Scr elevated 2.47.  Heparin for IABP placement 5/7. Heparin level at goal this am 0.32. Hgb stable, pltc trending down.    Goal of Therapy:  Heparin level 0.2-0.5 Monitor platelets by anticoagulation protocol: Yes  Plan:  Continue IV Heparin at 1250 units/hr. -Daily heparin level and CBC.  Erin Hearing PharmD., BCPS Clinical Pharmacist 06/28/2019 8:27 AM

## 2019-06-28 NOTE — Progress Notes (Signed)
Progress Note  Patient Name: PORFIRIO BOLLIER Date of Encounter: 06/28/2019  Primary Cardiologist: Virl Axe, MD   Subjective   No change in symptoms from yesterday.   Inpatient Medications    Scheduled Meds: . atorvastatin  20 mg Oral Daily  . Chlorhexidine Gluconate Cloth  6 each Topical Daily  . pantoprazole  40 mg Oral Daily  . potassium chloride  40 mEq Oral BID  . potassium chloride  40 mEq Oral Once  . sodium chloride flush  3 mL Intravenous Q12H  . spironolactone  12.5 mg Oral Daily   Continuous Infusions: . amiodarone 60 mg/hr (06/28/19 0807)  . furosemide (LASIX) infusion 20 mg/hr (06/28/19 0800)  . heparin 1,250 Units/hr (06/28/19 0800)  . milrinone 0.125 mcg/kg/min (06/28/19 0800)  . norepinephrine (LEVOPHED) Adult infusion 3 mcg/min (06/28/19 0800)   PRN Meds: acetaminophen **OR** acetaminophen, calcium carbonate, lidocaine (PF), ondansetron **OR** ondansetron (ZOFRAN) IV, sodium chloride flush, traMADol   Vital Signs    Vitals:   06/28/19 0800 06/28/19 0815 06/28/19 0830 06/28/19 0845  BP:      Pulse: (!) 112 (!) 110 97 (!) 54  Resp: 18 15 15 15   Temp: (!) 97.5 F (36.4 C) 97.7 F (36.5 C) 97.9 F (36.6 C) 97.7 F (36.5 C)  TempSrc: Core     SpO2: 98% 97% 99% 99%  Weight:      Height:        Intake/Output Summary (Last 24 hours) at 06/28/2019 0856 Last data filed at 06/28/2019 0800 Gross per 24 hour  Intake 2130.79 ml  Output 3055 ml  Net -924.21 ml   Filed Weights   06/25/19 0422 06/27/19 0500 06/28/19 0500  Weight: 74.8 kg 78.5 kg 78.2 kg    Telemetry    Atrial fib with a RVR/CVR - Personally Reviewed  ECG    none - Personally Reviewed  Physical Exam   GEN: No acute distress.   Neck: 7 cm JVD Cardiac: IRIRR, no murmurs, rubs, or gallops.  Respiratory: Clear to auscultation bilaterally. GI: Soft, nontender, non-distended  MS: No edema; No deformity. Neuro:  Nonfocal  Psych: Normal affect   Labs    Chemistry Recent  Labs  Lab 06/24/19 0526 06/24/19 1712 06/25/19 0421 06/25/19 2123 06/26/19 1759 06/27/19 0500 06/28/19 0601  NA 137   < > 138   < > 135 136 133*  K 4.6   < > 3.7   < > 4.0 3.6 3.8  CL 107  --  105   < > 101 102 97*  CO2 20*  --  22   < > 20* 24 24  GLUCOSE 115*  --  127*   < > 121* 122* 135*  BUN 25*  --  32*   < > 35* 31* 26*  CREATININE 2.78*  --  2.97*   < > 2.74* 2.47* 2.12*  CALCIUM 9.2  --  8.7*   < > 8.4* 8.6* 8.4*  PROT 6.9  --  6.4*  --  6.7  --   --   ALBUMIN 3.8  --  3.3*  --  3.3*  --   --   AST 62*  --  55*  --  42*  --   --   ALT 32  --  41  --  39  --   --   ALKPHOS 75  --  67  --  70  --   --   BILITOT 1.1  --  1.1  --  1.2  --   --   GFRNONAA 23*  --  21*   < > 23* 26* 31*  GFRAA 26*  --  24*   < > 27* 30* 36*  ANIONGAP 10  --  11   < > 14 10 12    < > = values in this interval not displayed.     Hematology Recent Labs  Lab 06/26/19 0511 06/27/19 0500 06/28/19 0601  WBC 8.5 7.6 6.8  RBC 3.90* 3.72* 3.70*  HGB 11.9* 11.2* 11.0*  HCT 37.2* 34.9* 34.3*  MCV 95.4 93.8 92.7  MCH 30.5 30.1 29.7  MCHC 32.0 32.1 32.1  RDW 14.1 14.0 13.9  PLT 227 191 161    Cardiac EnzymesNo results for input(s): TROPONINI in the last 168 hours. No results for input(s): TROPIPOC in the last 168 hours.   BNP Recent Labs  Lab 06/23/19 1527  BNP 613.2*     DDimer No results for input(s): DDIMER in the last 168 hours.   Radiology    CARDIAC CATHETERIZATION  Result Date: 06/26/2019 Successful IABP placement.    Cardiac Studies   reviewed  Patient Profile     69 y.o. male admitted with acute on chronic systolic heart failure and shock with atrial fib and a RVR  Assessment & Plan    1. Atrial fib with a RVR/CVR - his rates are still not optimal but stable with ave HR's in the 105 range. Continue IV amiodarone. Unclear if he could be cardioverted to NSR but he had a LAA thrombus so DCCV not attempted. Agree with continued amiodarone loading. He is coming up on 3  weeks of anti-coagulation and I wonder about trying DCCV 2. Acute on chronic systolic heart failure - he is improved with IABP but Co-ox 55 this morning, down from 58 yesterday. As per Dr. Reine Just. Weight is unchanged.  3. Acute on chronic renal failure - his creatinine continues to improved.  4. ICD - Discussed with Dr. Reine Just yesteday. His QRS is narrow and his rates are not that fast. I would consider another attempt at NSR after 3 weeks of systemic anti-coagulation. AV node ablation and biv upgrade with many potential pitfalls at this point.   For questions or updates, please contact [place] Please consult [email or web address] for contact info under Cardiology/STEMI.      Signed, Cristopher Peru, MD  06/28/2019, 8:56 AM  Patient ID: [name], male   DOB: 1950-09-06, 69 y.o.   MRN: [id number]

## 2019-06-28 NOTE — Progress Notes (Signed)
Advanced Heart Failure Rounding Note   Subjective:    IABP placed on 5/6. Remains on milrinone 0.125. NE 3 and lasix gtt at 20/h.   Remains on amio gtt.   Continues to feel better. Hungry. No nausea. Creatinine improving. Diuresed > 3L. Weight down 1 pound.  Co-ox 56% Creatinine 2.7 -> 2.5 -> 2.1    IABP 1:1 augmenting well  CVP 20 PA 51/22 PAPi 1.5 Thermo CO 3.2 CI 1.7   Objective:   Weight Range:  Vital Signs:   Temp:  [97.3 F (36.3 C)-100.4 F (38 C)] 97.7 F (36.5 C) (05/09 1315) Pulse Rate:  [54-136] 100 (05/09 1315) Resp:  [12-29] 17 (05/09 1315) SpO2:  [95 %-100 %] 98 % (05/09 1315) Weight:  [78.2 kg] 78.2 kg (05/09 0500) Last BM Date: 06/23/19  Weight change: Filed Weights   06/25/19 0422 06/27/19 0500 06/28/19 0500  Weight: 74.8 kg 78.5 kg 78.2 kg    Intake/Output:   Intake/Output Summary (Last 24 hours) at 06/28/2019 1420 Last data filed at 06/28/2019 1300 Gross per 24 hour  Intake 2198.37 ml  Output 2500 ml  Net -301.63 ml     Physical Exam: General:  Lying in bed. No resp difficulty HEENT: normal Neck: supple. RIJ swan Carotids 2+ bilat; no bruits. No lymphadenopathy or thryomegaly appreciated. Cor: PMI nondisplaced. Irreg +s3 . Lungs: clear Abdomen: soft, nontender, nondistended. No hepatosplenomegaly. No bruits or masses. Good bowel sounds. + chole drain  Extremities: no cyanosis, clubbing, rash, edema RFA IABP site ok  Neuro: alert & orientedx3, cranial nerves grossly intact. moves all 4 extremities w/o difficulty. Affect pleasant   Telemetry: AF 90-110 Personally reviewed   Labs: Basic Metabolic Panel: Recent Labs  Lab 06/25/19 0421 06/25/19 0735 06/25/19 2123 06/25/19 2123 06/26/19 0511 06/26/19 0511 06/26/19 1759 06/27/19 0500 06/28/19 0601  NA   < >  --  136  --  135  --  135 136 133*  K   < >  --  4.2  --  4.5  --  4.0 3.6 3.8  CL   < >  --  103  --  105  --  101 102 97*  CO2   < >  --  21*  --  21*  --  20* 24 24    GLUCOSE   < >  --  142*  --  140*  --  121* 122* 135*  BUN   < >  --  32*  --  32*  --  35* 31* 26*  CREATININE   < >  --  2.52*  --  2.58*  --  2.74* 2.47* 2.12*  CALCIUM   < >  --  8.8*   < > 8.5*   < > 8.4* 8.6* 8.4*  MG  --  1.7  --   --  2.4  --   --   --   --    < > = values in this interval not displayed.    Liver Function Tests: Recent Labs  Lab 06/22/19 1555 06/23/19 1235 06/24/19 0526 06/25/19 0421 06/26/19 1759  AST 21 21 62* 55* 42*  ALT 17 15 32 41 39  ALKPHOS 77 77 75 67 70  BILITOT 1.5* 1.6* 1.1 1.1 1.2  PROT 7.6 6.9 6.9 6.4* 6.7  ALBUMIN 4.2 3.8 3.8 3.3* 3.3*   Recent Labs  Lab 06/22/19 1555 06/23/19 1235  LIPASE 29 24   No results for input(s): AMMONIA in the  last 168 hours.  CBC: Recent Labs  Lab 06/23/19 1235 06/23/19 1235 06/24/19 1712 06/25/19 0421 06/26/19 0511 06/27/19 0500 06/28/19 0601  WBC 5.2  --   --  7.4 8.5 7.6 6.8  NEUTROABS  --   --   --  5.8  --   --   --   HGB 11.4*   < > 12.6*  12.2* 11.3* 11.9* 11.2* 11.0*  HCT 36.5*   < > 37.0*  36.0* 35.0* 37.2* 34.9* 34.3*  MCV 96.6  --   --  94.9 95.4 93.8 92.7  PLT 179  --   --  200 227 191 161   < > = values in this interval not displayed.    Cardiac Enzymes: No results for input(s): CKTOTAL, CKMB, CKMBINDEX, TROPONINI in the last 168 hours.  BNP: BNP (last 3 results) Recent Labs    02/03/19 2007 06/01/19 1413 06/23/19 1527  BNP 922.2* 531.4* 613.2*    ProBNP (last 3 results) No results for input(s): PROBNP in the last 8760 hours.    Other results:  Imaging: No results found.   Medications:     Scheduled Medications: . atorvastatin  20 mg Oral Daily  . Chlorhexidine Gluconate Cloth  6 each Topical Daily  . pantoprazole  40 mg Oral Daily  . potassium chloride  40 mEq Oral BID  . sodium chloride flush  3 mL Intravenous Q12H  . spironolactone  12.5 mg Oral Daily    Infusions: . amiodarone 60 mg/hr (06/28/19 1419)  . furosemide (LASIX) infusion 20 mg/hr  (06/28/19 1300)  . heparin 1,250 Units/hr (06/28/19 1300)  . milrinone 0.125 mcg/kg/min (06/28/19 1300)  . norepinephrine (LEVOPHED) Adult infusion 3 mcg/min (06/28/19 1300)    PRN Medications: acetaminophen **OR** acetaminophen, calcium carbonate, lidocaine (PF), ondansetron **OR** ondansetron (ZOFRAN) IV, sodium chloride flush, traMADol   Assessment/Plan:   1. Acute on Chronic Combined Systolic and Diastolic Heart Failure -> cardiogenic shock - 2/2 NICM. HF dates back to 2015.  - Most recent echo 4/21 w/ EF <20. RV mildly reduced. GIIIDD. Moderate TR. No LVH.  - [facility] 10/2018 showed normal cors, elevated filling pressures and low CI at 1.9  - Now admitted w/ NYHA IV symptoms, volume overload and worsening renal function, likely cardiorenal syndrome. SCr now 2.97 (prior baseline 1.2). BP ok. No hypotension  - RHC 5/5 showed Low output biventricular failure and marked volume overload. PCWP 27. CI 2.0.  - Milrinone increased to 0.25 mcg on 5/6 but later cut back to 0.125 mcg due to A fib RVR.  - IABP placed 5/7. NE added - On lasix gtt at 20 - Has responded well to IABP and low-dose NE but output still low. Can titrate NE as needed Renal function improving.  - Continue diuresis. Add metoalzone - He is a poor candidate for advanced therapies given an extensive h/o medication noncompliance. Next steps will be tricky. Hopefully can wean IABP and maintain output.   2. AKI - due to shock/cardiorenal - Creatinine trending down 3>2.58 > 2.47 > 2.1  3. Persistent Atrial Fibrillation w/ RVR - Recent DCCV attempt aborted 4/20 due to LAA thrombi on TEE -  limited rate/ rhythm control options. - use of digoxin limited due to AKI w/ SCr >2 - Continue amio drip 60 mg per hour. V rates improved -> now 90s at rest but 110 with any movement - will avoid?blocker currently given low output and acute decompensation - EP following for potential AV node ablation but ideally  would be able to upgrade to  CRT first - risk of possible embolic event discussed if chemical cardioversion with IV amio. Unfortunately no other options   4. NSVT - in the setting of severe LV dysfunction and milrinone  - continue IV amiodarone  - he has ICD - keep K >4.0 and Mg >2.0   5. Chronic Cholecystitis  - has perc biliary drain, stable. AF. WBC WNL  - management per primary team/ surgery   6. Hypokalemia - will supp  CRITICAL CARE Performed by: Glori Bickers  Total critical care time: 35 minutes  Critical care time was exclusive of separately billable procedures and treating other patients.  Critical care was necessary to treat or prevent imminent or life-threatening deterioration.  Critical care was time spent personally by me (independent of midlevel providers or residents) on the following activities: development of treatment plan with patient and/or surrogate as well as nursing, discussions with consultants, evaluation of patient's response to treatment, examination of patient, obtaining history from patient or surrogate, ordering and performing treatments and interventions, ordering and review of laboratory studies, ordering and review of radiographic studies, pulse oximetry and re-evaluation of patient's condition.   Length of Stay: 5   Glori Bickers MD 06/28/2019, 2:20 PM  Advanced Heart Failure Team Pager [phone] (M-F; [place])  Please contact [facility] for night-coverage after hours (4p -7a ) and weekends on [email or web address]

## 2019-06-29 DIAGNOSIS — N171 Acute kidney failure with acute cortical necrosis: Secondary | ICD-10-CM

## 2019-06-29 LAB — CBC
HCT: 33.6 % — ABNORMAL LOW (ref 39.0–52.0)
Hemoglobin: 11.2 g/dL — ABNORMAL LOW (ref 13.0–17.0)
MCH: 30.5 pg (ref 26.0–34.0)
MCHC: 33.3 g/dL (ref 30.0–36.0)
MCV: 91.6 fL (ref 80.0–100.0)
Platelets: 143 10*3/uL — ABNORMAL LOW (ref 150–400)
RBC: 3.67 MIL/uL — ABNORMAL LOW (ref 4.22–5.81)
RDW: 14 % (ref 11.5–15.5)
WBC: 6.8 10*3/uL (ref 4.0–10.5)
nRBC: 0 % (ref 0.0–0.2)

## 2019-06-29 LAB — BASIC METABOLIC PANEL
Anion gap: 11 (ref 5–15)
BUN: 26 mg/dL — ABNORMAL HIGH (ref 8–23)
CO2: 27 mmol/L (ref 22–32)
Calcium: 8.9 mg/dL (ref 8.9–10.3)
Chloride: 95 mmol/L — ABNORMAL LOW (ref 98–111)
Creatinine, Ser: 2.23 mg/dL — ABNORMAL HIGH (ref 0.61–1.24)
GFR calc Af Amer: 34 mL/min — ABNORMAL LOW (ref >60)
GFR calc non Af Amer: 29 mL/min — ABNORMAL LOW (ref >60)
Glucose, Bld: 122 mg/dL — ABNORMAL HIGH (ref 70–99)
Potassium: 4.5 mmol/L (ref 3.5–5.1)
Sodium: 133 mmol/L — ABNORMAL LOW (ref 135–145)

## 2019-06-29 LAB — COOXEMETRY PANEL
Carboxyhemoglobin: 1.9 % — ABNORMAL HIGH (ref 0.5–1.5)
Methemoglobin: 0.4 % (ref 0.0–1.5)
O2 Saturation: 56.7 %
Total hemoglobin: 11.6 g/dL — ABNORMAL LOW (ref 12.0–16.0)

## 2019-06-29 LAB — HEPARIN LEVEL (UNFRACTIONATED)
Heparin Unfractionated: 0.17 IU/mL — ABNORMAL LOW (ref 0.30–0.70)
Heparin Unfractionated: 0.37 IU/mL (ref 0.30–0.70)

## 2019-06-29 LAB — MAGNESIUM: Magnesium: 1.5 mg/dL — ABNORMAL LOW (ref 1.7–2.4)

## 2019-06-29 NOTE — Progress Notes (Addendum)
[place] for Heparin Indication: atrial fibrillation, IABP  Allergies  Allergen Reactions  . Benadryl [Diphenhydramine] Palpitations    Patient Measurements: Height: 5\' 7"  (170.2 cm) Weight: 76.3 kg (168 lb 3.4 oz) IBW/kg (Calculated) : 66.1 Heparin Dosing Weight: 76.2 kg  Vital Signs: Temp: 98.4 F (36.9 C) (05/10 1700) Temp Source: Core (05/10 1600) BP: 125/76 (05/10 1600) Pulse Rate: 110 (05/10 1700)  Labs: Recent Labs    06/27/19 0500 06/27/19 0500 06/28/19 0601 06/29/19 0500 06/29/19 1625  HGB 11.2*   < > 11.0* 11.2*  --   HCT 34.9*  --  34.3* 33.6*  --   PLT 191  --  161 143*  --   HEPARINUNFRC 0.27*   < > 0.32 0.17* 0.37  CREATININE 2.47*  --  2.12* 2.23*  --    < > = values in this interval not displayed.    Estimated Creatinine Clearance: 30.1 mL/min (A) (by C-G formula based on SCr of 2.23 mg/dL (H)).   Medical History: Past Medical History:  Diagnosis Date  . Acute blood loss anemia   . Acute CVA (cerebrovascular accident) ([place])   . Acute deep vein thrombosis (DVT) of both lower extremities ([name])   . Acute kidney injury ([place])   . Acute on chronic combined systolic and diastolic CHF (congestive heart failure) ([place])   . Acute renal failure superimposed on stage 3a chronic kidney disease ([place])   . Acute respiratory failure with hypoxia ([place])   . Atrial fibrillation ([place])   . Carpal tunnel syndrome of right wrist 02/28/2018  . Cerebral edema ([place]) 11/13/2018  . Cerebral infarction ([place])   . CHF (congestive heart failure) ([place])   . Cholecystitis 02/04/2019  . Chronic right hip pain   . DCM (dilated cardiomyopathy) ([place])   . Dysphagia, post-stroke   . Elevated troponin   . Entrapment of right ulnar nerve 02/28/2018  . Headache due to intracranial disease 11/14/2018  . Hepatitis C   . History of hemorrhagic stroke with residual hemiparesis ([place]) 02/04/2019  . HTN (hypertension) 08/14/2016  . Hyperlipidemia LDL  goal <70 11/13/2018  . Hypertension   . ICD (implantable cardioverter-defibrillator) in place 09/13/2016  . Impotence due to erectile dysfunction 09/30/2017  . Ischemic cardiomyopathy   . Labile blood glucose   . Left leg DVT ([place]) 02/04/2019  . Marijuana user 11/13/2018  . Paroxysmal atrial fibrillation ([name])   . Right middle cerebral artery stroke ([place]) 11/13/2018  . Solitary pulmonary nodule 06/10/2017   5 mm RUL nodule noted incidentally as part of CVA workup 08/2016. With smoking history would obtain low-dose CT scan 08/2017.   . Stroke (cerebrum) ([name]) Lg L MCA infarct w/ hemorrhagic conversion, embolic d/t AF 10/14/35  . Stroke ([place])   . Trochanteric bursitis, right hip 11/14/2018  . Visit for monitoring Tikosyn therapy 03/26/2017    Assessment: Pt is a 69 yo M with atrial fibrillation on dabigatran PTA. Pt reports last dose was the morning of 06/23/19.   Pharmacy asked to switch to heparin.  Heparin for IABP placement 5/7. Heparin level now at goal 0.37 this afternoon. Noted SCr 2.23. Hgb fairly stable, pltc trending down on IABP. No active bleed issues reported.  Goal of Therapy:  Heparin level 0.2-0.5 Monitor platelets by anticoagulation protocol: Yes   Plan:  Continue Heparin IV at 1350 units/hr. Confirmatory heparin level in 6 hours Monitor daily heparin level/CBC, s/sx bleeding   Arturo Morton, PharmD, BCPS Please check AMION  for all [place] contact numbers Clinical Pharmacist 06/29/2019 5:37 PM

## 2019-06-29 NOTE — Progress Notes (Addendum)
Advanced Heart Failure Rounding Note   Subjective:    IABP placed on 5/6. Remains on milrinone 0.125. NE 3 and lasix gtt at 20/h.   Remains on amio gtt.   Yesterday got a dose of metolazone in addition to lasix drip at 20 mg per hour.   Feeling better. Denies SOB. Having some pain around perc tube.   CVP 13 PA 37/19  PAPi 1.4  Thermo CO 4.5    CI 2.42 CO-OX 57%   Objective:   Weight Range:  Vital Signs:   Temp:  [97.5 F (36.4 C)-99.3 F (37.4 C)] 98.1 F (36.7 C) (05/10 0800) Pulse Rate:  [54-142] 112 (05/10 0800) Resp:  [12-45] 24 (05/10 0800) BP: (124)/(76) 124/76 (05/10 0800) SpO2:  [95 %-100 %] 96 % (05/10 0800) Weight:  [76.3 kg] 76.3 kg (05/10 0500) Last BM Date: 06/23/19  Weight change: Filed Weights   06/27/19 0500 06/28/19 0500 06/29/19 0500  Weight: 78.5 kg 78.2 kg 76.3 kg    Intake/Output:   Intake/Output Summary (Last 24 hours) at 06/29/2019 0830 Last data filed at 06/29/2019 0600 Gross per 24 hour  Intake 1991.9 ml  Output 3660 ml  Net -1668.1 ml     Physical Exam: CVP 13 General: . No resp difficulty HEENT: normal Neck: supple. JVP 11-12 . Carotids 2+ bilat; no bruits. No lymphadenopathy or thryomegaly appreciated. RIJ swan  Cor: PMI nondisplaced. Irregular rate & rhythm. No rubs, gallops or murmurs. Lungs: clear Abdomen: soft, nontender, nondistended. No hepatosplenomegaly. No bruits or masses. Good bowel sounds.RUQ perc tube  Extremities: no cyanosis, clubbing, rash, edema. R femoral IABP Neuro: alert & orientedx3, cranial nerves grossly intact. moves all 4 extremities w/o difficulty. Affect pleasant GU: foley clear urine    Telemetry:  A fib 90-100s    Labs: Basic Metabolic Panel: Recent Labs  Lab 06/25/19 0735 06/25/19 2123 06/26/19 0511 06/26/19 0511 06/26/19 1759 06/26/19 1759 06/27/19 0500 06/28/19 0601 06/29/19 0500  NA  --    < > 135  --  135  --  136 133* 133*  K  --    < > 4.5  --  4.0  --  3.6 3.8 4.5  CL   --    < > 105  --  101  --  102 97* 95*  CO2  --    < > 21*  --  20*  --  24 24 27   GLUCOSE  --    < > 140*  --  121*  --  122* 135* 122*  BUN  --    < > 32*  --  35*  --  31* 26* 26*  CREATININE  --    < > 2.58*  --  2.74*  --  2.47* 2.12* 2.23*  CALCIUM  --    < > 8.5*   < > 8.4*   < > 8.6* 8.4* 8.9  MG 1.7  --  2.4  --   --   --   --   --   --    < > = values in this interval not displayed.    Liver Function Tests: Recent Labs  Lab 06/22/19 1555 06/23/19 1235 06/24/19 0526 06/25/19 0421 06/26/19 1759  AST 21 21 62* 55* 42*  ALT 17 15 32 41 39  ALKPHOS 77 77 75 67 70  BILITOT 1.5* 1.6* 1.1 1.1 1.2  PROT 7.6 6.9 6.9 6.4* 6.7  ALBUMIN 4.2 3.8 3.8 3.3* 3.3*   Recent  Labs  Lab 06/22/19 1555 06/23/19 1235  LIPASE 29 24   No results for input(s): AMMONIA in the last 168 hours.  CBC: Recent Labs  Lab 06/25/19 0421 06/26/19 0511 06/27/19 0500 06/28/19 0601 06/29/19 0500  WBC 7.4 8.5 7.6 6.8 6.8  NEUTROABS 5.8  --   --   --   --   HGB 11.3* 11.9* 11.2* 11.0* 11.2*  HCT 35.0* 37.2* 34.9* 34.3* 33.6*  MCV 94.9 95.4 93.8 92.7 91.6  PLT 200 227 191 161 143*    Cardiac Enzymes: No results for input(s): CKTOTAL, CKMB, CKMBINDEX, TROPONINI in the last 168 hours.  BNP: BNP (last 3 results) Recent Labs    02/03/19 2007 06/01/19 1413 06/23/19 1527  BNP 922.2* 531.4* 613.2*    ProBNP (last 3 results) No results for input(s): PROBNP in the last 8760 hours.    Other results:  Imaging: No results found.   Medications:     Scheduled Medications: . atorvastatin  20 mg Oral Daily  . Chlorhexidine Gluconate Cloth  6 each Topical Daily  . pantoprazole  40 mg Oral Daily  . potassium chloride  40 mEq Oral BID  . sodium chloride flush  3 mL Intravenous Q12H  . spironolactone  12.5 mg Oral Daily    Infusions: . amiodarone 60 mg/hr (06/29/19 0600)  . furosemide (LASIX) infusion 20 mg/hr (06/29/19 0600)  . heparin 1,250 Units/hr (06/29/19 0600)  . milrinone  0.125 mcg/kg/min (06/29/19 0600)  . norepinephrine (LEVOPHED) Adult infusion 3 mcg/min (06/29/19 0600)    PRN Medications: acetaminophen **OR** acetaminophen, calcium carbonate, lidocaine (PF), ondansetron **OR** ondansetron (ZOFRAN) IV, sodium chloride flush, traMADol   Assessment/Plan:   1. Acute on Chronic Combined Systolic and Diastolic Heart Failure -> cardiogenic shock - 2/2 NICM. HF dates back to 2015.  - Most recent echo 4/21 w/ EF <20. RV mildly reduced. GIIIDD. Moderate TR. No LVH.  - [facility] 10/2018 showed normal cors, elevated filling pressures and low CI at 1.9  - Now admitted w/ NYHA IV symptoms, volume overload and worsening renal function, likely cardiorenal syndrome. SCr now 2.97 (prior baseline 1.2). BP ok. No hypotension  - RHC 5/5 showed Low output biventricular failure and marked volume overload. PCWP 27. CI 2.0.  - Milrinone increased to 0.25 mcg on 5/6 but later cut back to 0.125 mcg due to A fib RVR.  - IABP placed 5/7. NE added - On lasix gtt at 20. CVP 13  - Has responded well to IABP and low-dose NE but output still low. Can titrate NE as needed Renal function improving.  - Continue diuresis.  - He is a poor candidate for advanced therapies given an extensive h/o medication noncompliance.  - Continue IABP for now.   2. AKI - due to shock/cardiorenal - Creatinine trending down 3>2.58 > 2.47 > 2.2   3. Persistent Atrial Fibrillation w/ RVR - Recent DCCV attempt aborted 4/20 due to LAA thrombi on TEE -  limited rate/ rhythm control options. - use of digoxin limited due to AKI w/ SCr >2 - Continue amio drip 60 mg per hour. V rates improved -> 90-100s  - will avoid?blocker currently given low output and acute decompensation - EP following for potential AV node ablation but ideally would be able to upgrade to CRT first - risk of possible embolic event discussed if chemical cardioversion with IV amio. Unfortunately no other options  - Plan to repeat TEE  tomorrow to reassess for LAA clot.   4. NSVT -  in the setting of severe LV dysfunction and milrinone  - continue IV amiodarone  - he has ICD - keep K >4.0 and Mg >2.0   5. Chronic Cholecystitis  - has perc biliary drain, stable. AF.  - WBC normal.   - management per primary team/ surgery   6. Hypokalemia - will supp Length of Stay: [place] NP-C  06/29/2019, 8:30 AM  Advanced Heart Failure Team Pager [phone] (M-F; [place])  Please contact [facility] for night-coverage after hours (4p -7a ) and weekends on [email or web address]  Agree with above.  Remains with IABP in place. Also on milrinone and low-dose NE.   Has diuresed well. Swan numbers improving but co-ox and PAPi still marginal.   Remains in AF with RVR despite IV amio.   General:  Sitting up in bed. No resp difficulty HEENT: normal RIJ swan Neck: supple. no JVD. Carotids 2+ bilat; no bruits. No lymphadenopathy or thryomegaly appreciated. Cor: PMI nondisplaced. Irregular tachy +s3 Lungs: clear Abdomen: soft, nontender, nondistended. No hepatosplenomegaly. No bruits or masses. Good bowel sounds. Extremities: no cyanosis, clubbing, rash, edema Neuro: alert & orientedx3, cranial nerves grossly intact. moves all 4 extremities w/o difficulty. Affect pleasant  He remains in a very difficult spot. He is improved but remains IABP and inotrope dependent. We have been unable to achieve adequate AF rate control despite a large amio load. I do not think his hear has a chance of recovering unless we can slow his ventricular rate down.   I have d/w Dr. [name]. Will repeat TEE at bedside tomorrow am. If no LAA clot will attempt DC-CV. If LAA clot remains will need to consider acute AVN ablation with placement of LV lead as his last option (not candidate for VAD with noncompliance, CKD and RV dysfunction). If needs pacer procedure will need to decrease AC dose and consider empiric abx to mitigate the high-risk nature of that  undertaking.   CRITICAL CARE Performed by: Glori Bickers  Total critical care time: 45 minutes  Critical care time was exclusive of separately billable procedures and treating other patients.  Critical care was necessary to treat or prevent imminent or life-threatening deterioration.  Critical care was time spent personally by me (independent of midlevel providers or residents) on the following activities: development of treatment plan with patient and/or surrogate as well as nursing, discussions with consultants, evaluation of patient's response to treatment, examination of patient, obtaining history from patient or surrogate, ordering and performing treatments and interventions, ordering and review of laboratory studies, ordering and review of radiographic studies, pulse oximetry and re-evaluation of patient's condition.  Glori Bickers, MD  9:46 PM

## 2019-06-29 NOTE — Progress Notes (Signed)
[place] for Heparin Indication: atrial fibrillation, IABP  Allergies  Allergen Reactions  . Benadryl [Diphenhydramine] Palpitations    Patient Measurements: Height: 5\' 7"  (170.2 cm) Weight: 76.3 kg (168 lb 3.4 oz) IBW/kg (Calculated) : 66.1 Heparin Dosing Weight: 76.2 kg  Vital Signs: Temp: 98.1 F (36.7 C) (05/10 0800) Temp Source: Core (05/10 0800) BP: 124/76 (05/10 0800) Pulse Rate: 112 (05/10 0800)  Labs: Recent Labs    06/27/19 0500 06/27/19 0500 06/28/19 0601 06/29/19 0500  HGB 11.2*   < > 11.0* 11.2*  HCT 34.9*  --  34.3* 33.6*  PLT 191  --  161 143*  HEPARINUNFRC 0.27*  --  0.32 0.17*  CREATININE 2.47*  --  2.12* 2.23*   < > = values in this interval not displayed.    Estimated Creatinine Clearance: 30.1 mL/min (A) (by C-G formula based on SCr of 2.23 mg/dL (H)).   Medical History: Past Medical History:  Diagnosis Date  . Acute blood loss anemia   . Acute CVA (cerebrovascular accident) ([place])   . Acute deep vein thrombosis (DVT) of both lower extremities ([name])   . Acute kidney injury ([place])   . Acute on chronic combined systolic and diastolic CHF (congestive heart failure) ([place])   . Acute renal failure superimposed on stage 3a chronic kidney disease ([place])   . Acute respiratory failure with hypoxia ([place])   . Atrial fibrillation ([place])   . Carpal tunnel syndrome of right wrist 02/28/2018  . Cerebral edema ([place]) 11/13/2018  . Cerebral infarction ([place])   . CHF (congestive heart failure) ([place])   . Cholecystitis 02/04/2019  . Chronic right hip pain   . DCM (dilated cardiomyopathy) ([place])   . Dysphagia, post-stroke   . Elevated troponin   . Entrapment of right ulnar nerve 02/28/2018  . Headache due to intracranial disease 11/14/2018  . Hepatitis C   . History of hemorrhagic stroke with residual hemiparesis ([place]) 02/04/2019  . HTN (hypertension) 08/14/2016  . Hyperlipidemia LDL goal <70 11/13/2018  . Hypertension   .  ICD (implantable cardioverter-defibrillator) in place 09/13/2016  . Impotence due to erectile dysfunction 09/30/2017  . Ischemic cardiomyopathy   . Labile blood glucose   . Left leg DVT ([place]) 02/04/2019  . Marijuana user 11/13/2018  . Paroxysmal atrial fibrillation ([name])   . Right middle cerebral artery stroke ([place]) 11/13/2018  . Solitary pulmonary nodule 06/10/2017   5 mm RUL nodule noted incidentally as part of CVA workup 08/2016. With smoking history would obtain low-dose CT scan 08/2017.   . Stroke (cerebrum) ([name]) Lg L MCA infarct w/ hemorrhagic conversion, embolic d/t AF 6/64/4034  . Stroke ([place])   . Trochanteric bursitis, right hip 11/14/2018  . Visit for monitoring Tikosyn therapy 03/26/2017    Assessment: Pt is a 69 yo M with atrial fibrillation on dabigatran PTA. Pt reports last dose was the morning of 06/23/19.   Pharmacy asked to switch to heparin. CBC stable, no overt bleeding noted. Scr elevated 2.47.  Heparin for IABP placement 5/7. Heparin level below goal at 0.17.  No known issues with IV infusion. Hgb fairly stable, pltc trending down on IABP.    Goal of Therapy:  Heparin level 0.2-0.5 Monitor platelets by anticoagulation protocol: Yes   Plan:  Increase IV Heparin to 1350 units/hr. Recheck heparin level in 8 hrs Daily heparin level and CBC.  [name], [facility] Clinical Pharmacist Phone [phone]  06/29/2019 9:12 AM

## 2019-06-29 NOTE — Progress Notes (Addendum)
Electrophysiology Rounding Note  Patient Name: Glenn [name] Date of Encounter: 06/29/2019  Primary Cardiologist: Virl Axe, MD Electrophysiologist: Dr. [name] AHF: Dr. Haroldine Laws (new this admit)  Subjective   No new complaints. AF rates remain upper 90-100s, >110 with any exertion  IABP in place, Remains on 1:1 support Coox 56% on milrinone 0.125 mcg/kg/min Amio gtt at 60 mg/hr NE at 3 mcg/min  Inpatient Medications    Scheduled Meds: . atorvastatin  20 mg Oral Daily  . Chlorhexidine Gluconate Cloth  6 each Topical Daily  . pantoprazole  40 mg Oral Daily  . potassium chloride  40 mEq Oral BID  . sodium chloride flush  3 mL Intravenous Q12H  . spironolactone  12.5 mg Oral Daily   Continuous Infusions: . amiodarone 60 mg/hr (06/29/19 0600)  . furosemide (LASIX) infusion 20 mg/hr (06/29/19 0600)  . heparin 1,250 Units/hr (06/29/19 0600)  . milrinone 0.125 mcg/kg/min (06/29/19 0600)  . norepinephrine (LEVOPHED) Adult infusion 3 mcg/min (06/29/19 0600)   PRN Meds: acetaminophen **OR** acetaminophen, calcium carbonate, lidocaine (PF), ondansetron **OR** ondansetron (ZOFRAN) IV, sodium chloride flush, traMADol   Vital Signs    Vitals:   06/29/19 0300 06/29/19 0400 06/29/19 0500 06/29/19 0600  BP:      Pulse: (!) 101 (!) 142 (!) 103 (!) 103  Resp: 16 (!) 21 20 18   Temp: 98.8 F (37.1 C) 98.2 F (36.8 C) 98.2 F (36.8 C) 98.2 F (36.8 C)  TempSrc:  Core    SpO2: 96% 95% 98% 97%  Weight:   76.3 kg   Height:        Intake/Output Summary (Last 24 hours) at 06/29/2019 0734 Last data filed at 06/29/2019 0600 Gross per 24 hour  Intake 2231.25 ml  Output 3910 ml  Net -1678.75 ml   Filed Weights   06/27/19 0500 06/28/19 0500 06/29/19 0500  Weight: 78.5 kg 78.2 kg 76.3 kg    Physical Exam    GEN- Lying in bed. NAD.  Head- normocephalic, atraumatic Eyes-  Sclera clear, conjunctiva pink Ears- hearing intact Oropharynx- clear Neck- supple. RIJ  swan. Lungs- Clear to ausculation bilaterally, normal work of breathing Heart- Irregularly irregular. +S3.  GI- soft, NT, ND, + BS Extremities- no clubbing, cyanosis, or edema. IABP site stable (RFA) Skin- no rash or lesion Psych- euthymic mood, full affect Neuro- strength and sensation are intact  Labs    CBC Recent Labs    06/28/19 0601 06/29/19 0500  WBC 6.8 6.8  HGB 11.0* 11.2*  HCT 34.3* 33.6*  MCV 92.7 91.6  PLT 161 841*   Basic Metabolic Panel Recent Labs    06/28/19 0601 06/29/19 0500  NA 133* 133*  K 3.8 4.5  CL 97* 95*  CO2 24 27  GLUCOSE 135* 122*  BUN 26* 26*  CREATININE 2.12* 2.23*  CALCIUM 8.4* 8.9   Liver Function Tests Recent Labs    06/26/19 1759  AST 42*  ALT 39  ALKPHOS 70  BILITOT 1.2  PROT 6.7  ALBUMIN 3.3*   No results for input(s): LIPASE, AMYLASE in the last 72 hours. Cardiac Enzymes No results for input(s): CKTOTAL, CKMB, CKMBINDEX, TROPONINI in the last 72 hours.   Telemetry    AF 90-110s (personally reviewed)  Radiology    No results found.  Patient Profile     Glenn [name] is a 69 y.o. male with a past medical history significant for medical non-compliance, chronic systolic CHF, CVAs with hemorrhagic conversion, AF with RVR.  he  was admitted for Acute on chronic systolic CHF with cardiogenic shock in the setting of AF with RVR.   Assessment & Plan    1. AF with RVR Rates improved but still not optimal on IV amiodarone Continue IV amiodarone When seen in AF clinic 06/16/2019 he had not had any recent refills (months) of his pradaxa, despite him claiming to take it.   At visit 06/22/2019 he reported that the medications were "ready" but he had not yet picked them up.  He was then seen in the ED 5/4 and 5/5, and admitted for this admission 5/5. He has been on heparin since 06/24/2019, but most likely was not on Mazie PTA.  2. Acute on chronic systolic CHF, cardiogenic shock He remains on dual pressor support and  IABP Ideally, would support and ablate AV node, and consider BiV upgrade at same time vs soon after.   3. AKI 2.4 -> 2.1 -> 2.2 Follow with treatment of shock  Will discuss further with Dr. [name].   For questions or updates, please contact [place] Please consult [email or web address] for contact info under Cardiology/STEMI.  Signed, [name], PA-C  06/29/2019, 7:34 AM    As above.  Lengthy discussion with the patient and Dr. Reine Just.  He is as compensated as he has been.  Atrial fibrillation with rapid rates will need to be addressed I think during this time of hemodynamic support.  The plan would be to try TEE tomorrow to see if he is eligible for cardioversion given the previously identified left atrial clots.  I am not sanguine.  That leaves [place] with AV junction ablation and possibly device upgrade.  Again given his functional status as probably ideal this would be the opportunity to undertake both.  The challenge in the latter is that he will require ongoing anticoagulation because of his balloon pump.  Compressive dressings might help obviate the otherwise 20% risk of bleeding that is associated with device implants with heparin.  An alternative strategy would be to ablate his AV node.  Remove his balloon pump.  Use pharmacological inotropes and anticoagulate with warfarin.  We will have to navigate these waters.  Renal function also make CRT a little bit of challenge because of implications as to contrast.

## 2019-06-29 NOTE — Plan of Care (Signed)

## 2019-06-30 ENCOUNTER — Inpatient Hospital Stay (HOSPITAL_COMMUNITY): Payer: Medicare (Managed Care)

## 2019-06-30 DIAGNOSIS — I361 Nonrheumatic tricuspid (valve) insufficiency: Secondary | ICD-10-CM

## 2019-06-30 DIAGNOSIS — I4891 Unspecified atrial fibrillation: Secondary | ICD-10-CM

## 2019-06-30 LAB — COOXEMETRY PANEL
Carboxyhemoglobin: 2.3 % — ABNORMAL HIGH (ref 0.5–1.5)
Methemoglobin: 0.8 % (ref 0.0–1.5)
O2 Saturation: 60 %
Total hemoglobin: 12.6 g/dL (ref 12.0–16.0)

## 2019-06-30 LAB — BASIC METABOLIC PANEL
Anion gap: 9 (ref 5–15)
BUN: 25 mg/dL — ABNORMAL HIGH (ref 8–23)
CO2: 30 mmol/L (ref 22–32)
Calcium: 9 mg/dL (ref 8.9–10.3)
Chloride: 92 mmol/L — ABNORMAL LOW (ref 98–111)
Creatinine, Ser: 2.44 mg/dL — ABNORMAL HIGH (ref 0.61–1.24)
GFR calc Af Amer: 31 mL/min — ABNORMAL LOW (ref >60)
GFR calc non Af Amer: 26 mL/min — ABNORMAL LOW (ref >60)
Glucose, Bld: 135 mg/dL — ABNORMAL HIGH (ref 70–99)
Potassium: 4.9 mmol/L (ref 3.5–5.1)
Sodium: 131 mmol/L — ABNORMAL LOW (ref 135–145)

## 2019-06-30 LAB — CBC
HCT: 36.2 % — ABNORMAL LOW (ref 39.0–52.0)
Hemoglobin: 11.9 g/dL — ABNORMAL LOW (ref 13.0–17.0)
MCH: 29.6 pg (ref 26.0–34.0)
MCHC: 32.9 g/dL (ref 30.0–36.0)
MCV: 90 fL (ref 80.0–100.0)
Platelets: 161 10*3/uL (ref 150–400)
RBC: 4.02 MIL/uL — ABNORMAL LOW (ref 4.22–5.81)
RDW: 13.7 % (ref 11.5–15.5)
WBC: 7.7 10*3/uL (ref 4.0–10.5)
nRBC: 0 % (ref 0.0–0.2)

## 2019-06-30 LAB — HEPARIN LEVEL (UNFRACTIONATED)
Heparin Unfractionated: 0.44 IU/mL (ref 0.30–0.70)
Heparin Unfractionated: 0.52 IU/mL (ref 0.30–0.70)

## 2019-06-30 MED ORDER — FENTANYL CITRATE (PF) 100 MCG/2ML IJ SOLN: 50.0000 ug | Freq: Once | INTRAMUSCULAR | Status: AC

## 2019-06-30 MED ORDER — FENTANYL CITRATE (PF) 100 MCG/2ML IJ SOLN
INTRAMUSCULAR | Status: AC
  Filled 2019-06-30: qty 2

## 2019-06-30 MED ORDER — MIDAZOLAM HCL 2 MG/2ML IJ SOLN
INTRAMUSCULAR | Status: AC
  Administered 2019-06-30: 2 mg via INTRAVENOUS
  Filled 2019-06-30: qty 2

## 2019-06-30 MED ORDER — ETOMIDATE 2 MG/ML IV SOLN: 8.0000 mg | Freq: Once | INTRAVENOUS | Status: AC

## 2019-06-30 MED ORDER — MIDAZOLAM HCL 2 MG/2ML IJ SOLN: 2.0000 mg | Freq: Once | INTRAMUSCULAR | Status: AC

## 2019-06-30 MED ORDER — TRAZODONE HCL 50 MG PO TABS
50.0000 mg | ORAL_TABLET | Freq: Every evening | ORAL | Status: DC | PRN
  Administered 2019-06-30 – 2019-07-12 (×6): 50 mg via ORAL
  Filled 2019-06-30 (×6): qty 1

## 2019-06-30 MED ORDER — FENTANYL CITRATE (PF) 100 MCG/2ML IJ SOLN
50.0000 ug | Freq: Once | INTRAMUSCULAR | Status: AC
  Administered 2019-06-30: 50 ug via INTRAVENOUS

## 2019-06-30 MED ORDER — FENTANYL CITRATE (PF) 100 MCG/2ML IJ SOLN
INTRAMUSCULAR | Status: AC
  Administered 2019-06-30: 50 ug
  Filled 2019-06-30: qty 2

## 2019-06-30 MED ORDER — SODIUM CHLORIDE 0.9 % IV SOLN: INTRAVENOUS | Status: DC

## 2019-06-30 MED ORDER — ETOMIDATE 2 MG/ML IV SOLN
INTRAVENOUS | Status: AC
  Administered 2019-06-30: 8 mg via INTRAVENOUS
  Filled 2019-06-30: qty 10

## 2019-06-30 MED ORDER — TRAZODONE HCL 50 MG PO TABS: 50.0000 mg | ORAL_TABLET | Freq: Every day | ORAL | Status: DC

## 2019-06-30 MED ORDER — MIDAZOLAM HCL 2 MG/2ML IJ SOLN
INTRAMUSCULAR | Status: AC
  Administered 2019-06-30: 2 mg via INTRAVENOUS
  Filled 2019-06-30: qty 4

## 2019-06-30 NOTE — Progress Notes (Signed)
Advanced Heart Failure Rounding Note   Subjective:    IABP placed on 5/6. Remains on 1:1 with good augmentation.  Remains on milrinone 0.125. NE 3 and lasix gtt at 20/h.   Remains on amio gtt. AF rates still 110-120.  Feels a bit better. Denies CP, SOB, orthopnea or PND   CVP 8 PA 43/20 PAPi 1.4  Thermo 3.7/2.0 Co-ox 60%  Objective:   Weight Range:  Vital Signs:   Temp:  [97.3 F (36.3 C)-99 F (37.2 C)] 97.3 F (36.3 C) (05/11 0700) Pulse Rate:  [80-131] 104 (05/11 0700) Resp:  [15-28] 16 (05/11 0700) BP: (116-160)/(58-90) 142/89 (05/11 0700) SpO2:  [95 %-100 %] 97 % (05/11 0700) Weight:  [73.4 kg] 73.4 kg (05/11 0500) Last BM Date: (prior to admit)  Weight change: Filed Weights   06/28/19 0500 06/29/19 0500 06/30/19 0500  Weight: 78.2 kg 76.3 kg 73.4 kg    Intake/Output:   Intake/Output Summary (Last 24 hours) at 06/30/2019 0859 Last data filed at 06/30/2019 0700 Gross per 24 hour  Intake 2459.25 ml  Output 5160 ml  Net -2700.75 ml     Physical Exam: CVP 10 General:  Lying in bed  No resp difficulty HEENT: normal Neck: supple. RIJ swan. Carotids 2+ bilat; no bruits. No lymphadenopathy or thryomegaly appreciated. Cor: PMI nondisplaced. Irregular. Tachy No rubs, gallops or murmurs. Lungs: clear Abdomen: soft, nontender, nondistended. No hepatosplenomegaly. No bruits or masses. Good bowel sounds. + biliary drain  Extremities: no cyanosis, clubbing, rash, edema + RFA IABP Neuro: alert & orientedx3, cranial nerves grossly intact. moves all 4 extremities w/o difficulty. Affect pleasant    Telemetry:  A fib 110-120 Personally reviewed   Labs: Basic Metabolic Panel: Recent Labs  Lab 06/25/19 0735 06/25/19 2123 06/26/19 0511 06/26/19 0511 06/26/19 1759 06/26/19 1759 06/27/19 0500 06/27/19 0500 06/28/19 0601 06/29/19 0500 06/29/19 1011 06/30/19 0400  NA  --    < > 135   < > 135  --  136  --  133* 133*  --  131*  K  --    < > 4.5   < > 4.0   --  3.6  --  3.8 4.5  --  4.9  CL  --    < > 105   < > 101  --  102  --  97* 95*  --  92*  CO2  --    < > 21*   < > 20*  --  24  --  24 27  --  30  GLUCOSE  --    < > 140*   < > 121*  --  122*  --  135* 122*  --  135*  BUN  --    < > 32*   < > 35*  --  31*  --  26* 26*  --  25*  CREATININE  --    < > 2.58*   < > 2.74*  --  2.47*  --  2.12* 2.23*  --  2.44*  CALCIUM  --    < > 8.5*   < > 8.4*   < > 8.6*   < > 8.4* 8.9  --  9.0  MG 1.7  --  2.4  --   --   --   --   --   --   --  1.5*  --    < > = values in this interval not displayed.    Liver Function Tests:  Recent Labs  Lab 06/23/19 1235 06/24/19 0526 06/25/19 0421 06/26/19 1759  AST 21 62* 55* 42*  ALT 15 32 41 39  ALKPHOS 77 75 67 70  BILITOT 1.6* 1.1 1.1 1.2  PROT 6.9 6.9 6.4* 6.7  ALBUMIN 3.8 3.8 3.3* 3.3*   Recent Labs  Lab 06/23/19 1235  LIPASE 24   No results for input(s): AMMONIA in the last 168 hours.  CBC: Recent Labs  Lab 06/25/19 0421 06/25/19 0421 06/26/19 0511 06/27/19 0500 06/28/19 0601 06/29/19 0500 06/30/19 0400  WBC 7.4   < > 8.5 7.6 6.8 6.8 7.7  NEUTROABS 5.8  --   --   --   --   --   --   HGB 11.3*   < > 11.9* 11.2* 11.0* 11.2* 11.9*  HCT 35.0*   < > 37.2* 34.9* 34.3* 33.6* 36.2*  MCV 94.9   < > 95.4 93.8 92.7 91.6 90.0  PLT 200   < > 227 191 161 143* 161   < > = values in this interval not displayed.    Cardiac Enzymes: No results for input(s): CKTOTAL, CKMB, CKMBINDEX, TROPONINI in the last 168 hours.  BNP: BNP (last 3 results) Recent Labs    02/03/19 2007 06/01/19 1413 06/23/19 1527  BNP 922.2* 531.4* 613.2*    ProBNP (last 3 results) No results for input(s): PROBNP in the last 8760 hours.    Other results:  Imaging: DG CHEST PORT 1 VIEW  Result Date: 06/30/2019 CLINICAL DATA:  Intra-aortic balloon pump. EXAM: PORTABLE CHEST 1 VIEW COMPARISON:  Chest x-ray dated Jun 25, 2019. FINDINGS: New IABP radiopaque marker in the proximal descending thoracic aorta at the level of  the AP window. Unchanged left chest wall pacemaker. Interval retraction of the Swan-Ganz catheter with the tip now in the distal right pulmonary artery. Unchanged cardiomegaly with improved interstitial pulmonary edema and vascular congestion. Unchanged small right pleural effusion. No consolidation or pneumothorax. No acute osseous abnormality. Old right-sided rib fractures again noted. Partially visualized catheter tubing in the right upper quadrant. IMPRESSION: 1. Appropriately positioned IABP. 2. Improving interstitial pulmonary edema and vascular congestion. Unchanged small right pleural effusion. 3. Interval retraction of the Swan-Ganz catheter with tip now in the distal right pulmonary artery. Electronically Signed   By: Titus Dubin M.D.   On: 06/30/2019 08:47     Medications:     Scheduled Medications: . atorvastatin  20 mg Oral Daily  . Chlorhexidine Gluconate Cloth  6 each Topical Daily  . pantoprazole  40 mg Oral Daily  . potassium chloride  40 mEq Oral BID  . sodium chloride flush  3 mL Intravenous Q12H  . spironolactone  12.5 mg Oral Daily    Infusions: . sodium chloride    . amiodarone 60 mg/hr (06/30/19 0842)  . furosemide (LASIX) infusion 20 mg/hr (06/30/19 0700)  . heparin 1,350 Units/hr (06/30/19 0700)  . milrinone 0.125 mcg/kg/min (06/30/19 0700)  . norepinephrine (LEVOPHED) Adult infusion 3 mcg/min (06/30/19 0700)    PRN Medications: acetaminophen **OR** acetaminophen, calcium carbonate, lidocaine (PF), ondansetron **OR** ondansetron (ZOFRAN) IV, sodium chloride flush, traMADol   Assessment/Plan:   1. Acute on Chronic Combined Systolic and Diastolic Heart Failure -> cardiogenic shock - 2/2 NICM. HF dates back to 2015.  - Most recent echo 4/21 w/ EF <20. RV mildly reduced. GIIIDD. Moderate TR. No LVH.  - [facility] 10/2018 showed normal cors, elevated filling pressures and low CI at 1.9  - Now admitted w/ NYHA IV symptoms,  volume overload and worsening renal  function, likely cardiorenal syndrome. SCr now 2.97 (prior baseline 1.2). BP ok. No hypotension  - [facility] 5/5 showed Low output biventricular failure and marked volume overload. PCWP 27. CI 2.0. - IABP placed 5/7 on 1:1 with good augmentation - On milrinone 0.125 and NE 3. Co-ox improved to 60% - On lasix gtt at 20. CVP down to 10  Weight down 14 pounds.  - He remains in a very difficult spot. He is improved but remains IABP and inotrope dependent. We have been unable to achieve adequate AF rate control despite a large amio load. I do not think his hear has a chance of recovering unless we can slow his ventricular rate down.  - I have d/w Dr. [name]. Will repeat TEE at bedside this am. If no LAA clot will attempt DC-CV. If LAA clot remains will need to consider acute AVN ablation with placement of LV lead as his last option (not candidate for VAD with noncompliance, CKD and RV dysfunction). If needs pacer procedure will need to decrease AC dose and consider empiric abx to mitigate the high-risk nature of that undertaking.  - Can stop lasix today   2. AKI - due to shock/cardiorenal - Creatinine trending back up 3>2.58 > 2.47 > 2.2 > 2.4/  - Hold lasix    3. Persistent Atrial Fibrillation w/ RVR - Recent DCCV attempt aborted 4/20 due to LAA thrombi on TEE -  limited rate/ rhythm control options. - use of digoxin limited due to AKI w/ SCr >2 - Continue amio drip 60 mg per hour. V rates improved -> 90-100s  - will avoid?blocker currently given low output and acute decompensation - EP following for potential AV node ablation but ideally would be able to upgrade to CRT first - Plan as above  4. NSVT - in the setting of severe LV dysfunction and milrinone  - continue IV amiodarone  - he has ICD - keep K >4.0 and Mg >2.0   5. Chronic Cholecystitis  - has perc biliary drain, stable. AF.  - WBC normal.   - management per primary team/ surgery   6. Hypokalemia - will supp  Length of Stay:  7   CRITICAL CARE Performed by: Glori Bickers  Total critical care time: 35 minutes  Critical care time was exclusive of separately billable procedures and treating other patients.  Critical care was necessary to treat or prevent imminent or life-threatening deterioration.  Critical care was time spent personally by me (independent of midlevel providers or residents) on the following activities: development of treatment plan with patient and/or surrogate as well as nursing, discussions with consultants, evaluation of patient's response to treatment, examination of patient, obtaining history from patient or surrogate, ordering and performing treatments and interventions, ordering and review of laboratory studies, ordering and review of radiographic studies, pulse oximetry and re-evaluation of patient's condition.    Glori Bickers MD 06/30/2019, 8:59 AM  Advanced Heart Failure Team Pager [phone] (M-F; [place])  Please contact [facility] for night-coverage after hours (4p -7a ) and weekends on [email or web address]

## 2019-06-30 NOTE — CV Procedure (Signed)
   TRANSESOPHAGEAL ECHOCARDIOGRAM GUIDED DIRECT CURRENT CARDIOVERSION  NAME:  Glenn [name]   MRN: [id number] DOB:  11-19-1950   ADMIT DATE: 06/23/2019  INDICATIONS:  Atrial fibrillation  PROCEDURE:   Informed consent was obtained prior to the procedure. The risks, benefits and alternatives for the procedure were discussed and the patient comprehended these risks.  Risks include, but are not limited to, cough, sore throat, vomiting, nausea, somnolence, esophageal and stomach trauma or perforation, bleeding, low blood pressure, aspiration, pneumonia, infection, trauma to the teeth and death.    After a procedural time-out, the oropharynx was anesthetized and the patient was sedated with 4mg  versed and 100 mcg fentanyl in progressive fashion. Saturations and vitals were closely monitored by me and CCU nursing staff. The transesophageal probe was inserted in the esophagus and stomach without difficulty and multiple views were obtained.   FINDINGS:  LEFT VENTRICLE: EF = 20-25% global HK  RIGHT VENTRICLE: Severely HK  LEFT ATRIUM: Markedly dialted  LEFT ATRIAL APPENDAGE: + smoke. No formed clot  RIGHT ATRIUM: Markedly dilated  AORTIC VALVE:  [place].  Moderate AI  MITRAL VALVE:    Trivial MR  TRICUSPID VALVE: Severe TR  PULMONIC VALVE: Mild PR  INTERATRIAL SEPTUM: No PFO or ASD  PERICARDIUM: No effusion   DESCENDING AORTA: Not well visualized   CARDIOVERSION:     Indications:  Atrial Fibrillation  Procedure Details:  Once the TEE was complete, the patient had the defibrillator pads placed in the anterior and posterior position. He was further sedated by Dr. Erskine Emery of the anesthesia service with 8mg  IV etomidate Once an appropriate level of sedation was achieved, the patient received a single biphasic, synchronized 200J shock with prompt conversion to sinus rhythm. No apparent complications.   Glori Bickers, MD  9:55 AM

## 2019-06-30 NOTE — Progress Notes (Signed)
[facility]MTS continues to follow on Eion Timbrook Desanto's progress while in the ICU. His cardiac status continue to require ICU level care. His HR remains difficult to control w/ amiodarone with limited other options. We will resume care when CCM assesses Mr. Lazalde as stable and is ready to transfer care back to FMTS.   Please page [phone]   Addison Naegeli, M.D.  Family Medicine  PGY-2 06/30/2019 9:20 AM

## 2019-06-30 NOTE — Plan of Care (Addendum)
Stable overnight, rested well, repositioned frequently for comfort and skin integrity.   Remains in Afib, rate controlled 95-110. Amio gtt, Milrinone gtt, low dose levo gtt, stable BP. IABP remains on 1:1 with good augmentation. Heparin gtt, Hep level 0.52. Coox 60. CVP 8-12. Diuresing well on Lasix gtt with 5.75 L urine/24 hr via condom cath. Weight down almost 3 kg.   Continued education on heart failure, NPO for TEE and possible DCCV today.    Problem: Education: Goal: Knowledge of General Education information will improve Description: Including pain rating scale, medication(s)/side effects and non-pharmacologic comfort measures Outcome: Progressing   Problem: Health Behavior/Discharge Planning: Goal: Ability to manage health-related needs will improve Outcome: Progressing   Problem: Clinical Measurements: Goal: Ability to maintain clinical measurements within normal limits will improve Outcome: Progressing Goal: Will remain free from infection Outcome: Progressing Goal: Diagnostic test results will improve Outcome: Progressing Goal: Respiratory complications will improve Outcome: Progressing Goal: Cardiovascular complication will be avoided Outcome: Progressing   Problem: Nutrition: Goal: Adequate nutrition will be maintained Outcome: Progressing   Problem: Coping: Goal: Level of anxiety will decrease Outcome: Progressing   Problem: Elimination: Goal: Will not experience complications related to urinary retention Outcome: Progressing   Problem: Pain Managment: Goal: General experience of comfort will improve Outcome: Progressing   Problem: Safety: Goal: Ability to remain free from injury will improve Outcome: Progressing   Problem: Cardiovascular: Goal: Ability to achieve and maintain adequate cardiovascular perfusion will improve Outcome: Progressing Goal: Vascular access site(s) Level 0-1 will be maintained Outcome: Progressing

## 2019-06-30 NOTE — Progress Notes (Signed)
  Echocardiogram Echocardiogram Transesophageal has been performed.  Glenn [name] A Glenn [name] 06/30/2019, 10:08 AM

## 2019-06-30 NOTE — Progress Notes (Addendum)
Electrophysiology Rounding Note  Patient Name: Glenn [name] Date of Encounter: 06/30/2019  Primary Cardiologist: Virl Axe, MD Electrophysiologist: Dr. [name] AFH: Dr. Haroldine Laws    Subjective   Doing about the same this am. Rates remain 100-110s. For TEE today to further evaluate thrombus  Remains on IABP, Milrinone, NE, and Amio gtt.  Inpatient Medications    Scheduled Meds: . atorvastatin  20 mg Oral Daily  . Chlorhexidine Gluconate Cloth  6 each Topical Daily  . pantoprazole  40 mg Oral Daily  . potassium chloride  40 mEq Oral BID  . sodium chloride flush  3 mL Intravenous Q12H  . spironolactone  12.5 mg Oral Daily   Continuous Infusions: . sodium chloride    . amiodarone 60 mg/hr (06/30/19 0842)  . furosemide (LASIX) infusion 20 mg/hr (06/30/19 0700)  . heparin 1,350 Units/hr (06/30/19 0700)  . milrinone 0.125 mcg/kg/min (06/30/19 0700)  . norepinephrine (LEVOPHED) Adult infusion 3 mcg/min (06/30/19 0700)   PRN Meds: acetaminophen **OR** acetaminophen, calcium carbonate, lidocaine (PF), ondansetron **OR** ondansetron (ZOFRAN) IV, sodium chloride flush, traMADol   Vital Signs    Vitals:   06/30/19 0400 06/30/19 0500 06/30/19 0600 06/30/19 0700  BP: (!) 151/89 135/70 (!) 143/76 (!) 142/89  Pulse: (!) 114 (!) 106 (!) 115 (!) 104  Resp: 19 (!) 22 16 16   Temp: 97.9 F (36.6 C) 97.7 F (36.5 C) (!) 97.5 F (36.4 C) (!) 97.3 F (36.3 C)  TempSrc: Core     SpO2: 100% 97% 98% 97%  Weight:  73.4 kg    Height:        Intake/Output Summary (Last 24 hours) at 06/30/2019 0852 Last data filed at 06/30/2019 0700 Gross per 24 hour  Intake 2459.25 ml  Output 5160 ml  Net -2700.75 ml   Filed Weights   06/28/19 0500 06/29/19 0500 06/30/19 0500  Weight: 78.2 kg 76.3 kg 73.4 kg    Physical Exam    GEN- The patient is chronically ill appearing, alert and oriented x 3 today.   Head- normocephalic, atraumatic Eyes-  Sclera clear, conjunctiva pink Ears-  hearing intact Oropharynx- clear Neck- supple, RIJ swan stable Lungs- Clear to ausculation bilaterally, normal work of breathing Heart- Irregularly irregular, slightly tachy. no murmurs, rubs or gallops GI- soft, NT, ND, + BS Extremities- no clubbing, cyanosis, or edema. R femoral IABP Skin- no rash or lesion Psych- euthymic mood, full affect Neuro- strength and sensation are intact  Labs    CBC Recent Labs    06/29/19 0500 06/30/19 0400  WBC 6.8 7.7  HGB 11.2* 11.9*  HCT 33.6* 36.2*  MCV 91.6 90.0  PLT 143* 672   Basic Metabolic Panel Recent Labs    06/29/19 0500 06/29/19 1011 06/30/19 0400  NA 133*  --  131*  K 4.5  --  4.9  CL 95*  --  92*  CO2 27  --  30  GLUCOSE 122*  --  135*  BUN 26*  --  25*  CREATININE 2.23*  --  2.44*  CALCIUM 8.9  --  9.0  MG  --  1.5*  --    Liver Function Tests No results for input(s): AST, ALT, ALKPHOS, BILITOT, PROT, ALBUMIN in the last 72 hours. No results for input(s): LIPASE, AMYLASE in the last 72 hours. Cardiac Enzymes No results for input(s): CKTOTAL, CKMB, CKMBINDEX, TROPONINI in the last 72 hours.   Telemetry    Afib with variable rates, mostly 100-110s (personally reviewed)  Radiology  DG CHEST PORT 1 VIEW  Result Date: 06/30/2019 CLINICAL DATA:  Intra-aortic balloon pump. EXAM: PORTABLE CHEST 1 VIEW COMPARISON:  Chest x-ray dated Jun 25, 2019. FINDINGS: New IABP radiopaque marker in the proximal descending thoracic aorta at the level of the AP window. Unchanged left chest wall pacemaker. Interval retraction of the Swan-Ganz catheter with the tip now in the distal right pulmonary artery. Unchanged cardiomegaly with improved interstitial pulmonary edema and vascular congestion. Unchanged small right pleural effusion. No consolidation or pneumothorax. No acute osseous abnormality. Old right-sided rib fractures again noted. Partially visualized catheter tubing in the right upper quadrant. IMPRESSION: 1. Appropriately  positioned IABP. 2. Improving interstitial pulmonary edema and vascular congestion. Unchanged small right pleural effusion. 3. Interval retraction of the Swan-Ganz catheter with tip now in the distal right pulmonary artery. Electronically Signed   By: Titus Dubin M.D.   On: 06/30/2019 08:47    Patient Profile     Glenn [name] is a 69 y.o. male with a past medical history significant for medical non-compliance, chronic systolic CHF, CVAs with hemorrhagic conversion, AF with RVR.  he was admitted for Acute on chronic systolic CHF with cardiogenic shock in the setting of AF with RVR.   Assessment & Plan    1. AF with RVR Rates improved but remains somewhat elevated on IV amiodarone Continue IV amiodarone, at least while on pressor support Chronically non-compliant with OAC. Would only consider him appropriately anticoagulated since admission.   2. Acute on chronic systolic CHF, cardiogenic shock Remains on dual pressor support and IABP TEE today to further clarify plan moving forward.  3. AKI Cr climbing 2.1 -> 2.2 -> 2.4  Plan TEE today. If no LAA clot to attempt DC-CV. If LAA clot, plan to consider AVN ablation +/- LV lead placement now, or once further optimized.   Not candidate for VAD with non-compliance, CKD, and RV dysfunction.   For questions or updates, please contact [place] Please consult [email or web address] for contact info under Cardiology/STEMI.  Signed, [name], PA-C  06/30/2019, 8:52 AM    As above  Hemodynamically better; plan is TEE/DCCV for today-- not sanguine that the clot will be gone and with [employer] that cardioversion will be successful , but it is clearly the easiest way out of a very tough place  Will discuss AV ablation and device upgrade in the wake of cardioversion  Discussed the challenges of his cardiac condition

## 2019-06-30 NOTE — Progress Notes (Signed)
[place] for Heparin Indication: atrial fibrillation, IABP  Allergies  Allergen Reactions  . Benadryl [Diphenhydramine] Palpitations    Patient Measurements: Height: 5\' 7"  (170.2 cm) Weight: 76.3 kg (168 lb 3.4 oz) IBW/kg (Calculated) : 66.1 Heparin Dosing Weight: 76.2 kg  Vital Signs: Temp: 98.6 F (37 C) (05/11 0000) Temp Source: Core (05/11 0000) BP: 127/76 (05/10 2315) Pulse Rate: 107 (05/11 0000)  Labs: Recent Labs    06/27/19 0500 06/27/19 0500 06/28/19 0601 06/28/19 0601 06/29/19 0500 06/29/19 1625 06/29/19 2313  HGB 11.2*   < > 11.0*  --  11.2*  --   --   HCT 34.9*  --  34.3*  --  33.6*  --   --   PLT 191  --  161  --  143*  --   --   HEPARINUNFRC 0.27*   < > 0.32   < > 0.17* 0.37 0.44  CREATININE 2.47*  --  2.12*  --  2.23*  --   --    < > = values in this interval not displayed.    Estimated Creatinine Clearance: 30.1 mL/min (A) (by C-G formula based on SCr of 2.23 mg/dL (H)).  Assessment: 69 yo M with atrial fibrillation, [name] on hold, IABP in place, for heparin  Goal of Therapy:  Heparin level 0.3-0.5 Monitor platelets by anticoagulation protocol: Yes   Plan:  Continue Heparin at current rate  Phillis Knack, PharmD, BCPS  06/30/2019 12:37 AM

## 2019-06-30 NOTE — Progress Notes (Signed)
[place] for Heparin Indication: atrial fibrillation, IABP  Allergies  Allergen Reactions  . Benadryl [Diphenhydramine] Palpitations    Patient Measurements: Height: 5\' 7"  (170.2 cm) Weight: 73.4 kg (161 lb 13.1 oz) IBW/kg (Calculated) : 66.1 Heparin Dosing Weight: 76.2 kg  Vital Signs: Temp: 97.3 F (36.3 C) (05/11 0700) Temp Source: Core (05/11 0400) BP: 142/89 (05/11 0700) Pulse Rate: 104 (05/11 0700)  Labs: Recent Labs    06/28/19 0601 06/28/19 0601 06/29/19 0500 06/29/19 0500 06/29/19 1625 06/29/19 2313 06/30/19 0400  HGB 11.0*   < > 11.2*  --   --   --  11.9*  HCT 34.3*  --  33.6*  --   --   --  36.2*  PLT 161  --  143*  --   --   --  161  HEPARINUNFRC 0.32   < > 0.17*   < > 0.37 0.44 0.52  CREATININE 2.12*  --  2.23*  --   --   --  2.44*   < > = values in this interval not displayed.    Estimated Creatinine Clearance: 27.5 mL/min (A) (by C-G formula based on SCr of 2.44 mg/dL (H)).   Medical History: Past Medical History:  Diagnosis Date  . Acute blood loss anemia   . Acute CVA (cerebrovascular accident) ([place])   . Acute deep vein thrombosis (DVT) of both lower extremities ([name])   . Acute kidney injury ([place])   . Acute on chronic combined systolic and diastolic CHF (congestive heart failure) ([place])   . Acute renal failure superimposed on stage 3a chronic kidney disease ([place])   . Acute respiratory failure with hypoxia ([place])   . Atrial fibrillation ([place])   . Carpal tunnel syndrome of right wrist 02/28/2018  . Cerebral edema ([place]) 11/13/2018  . Cerebral infarction ([place])   . CHF (congestive heart failure) ([place])   . Cholecystitis 02/04/2019  . Chronic right hip pain   . DCM (dilated cardiomyopathy) ([place])   . Dysphagia, post-stroke   . Elevated troponin   . Entrapment of right ulnar nerve 02/28/2018  . Headache due to intracranial disease 11/14/2018  . Hepatitis C   . History of hemorrhagic stroke with residual  hemiparesis ([place]) 02/04/2019  . HTN (hypertension) 08/14/2016  . Hyperlipidemia LDL goal <70 11/13/2018  . Hypertension   . ICD (implantable cardioverter-defibrillator) in place 09/13/2016  . Impotence due to erectile dysfunction 09/30/2017  . Ischemic cardiomyopathy   . Labile blood glucose   . Left leg DVT ([place]) 02/04/2019  . Marijuana user 11/13/2018  . Paroxysmal atrial fibrillation ([name])   . Right middle cerebral artery stroke ([place]) 11/13/2018  . Solitary pulmonary nodule 06/10/2017   5 mm RUL nodule noted incidentally as part of CVA workup 08/2016. With smoking history would obtain low-dose CT scan 08/2017.   . Stroke (cerebrum) ([name]) Lg L MCA infarct w/ hemorrhagic conversion, embolic d/t AF 7/59/1638  . Stroke ([place])   . Trochanteric bursitis, right hip 11/14/2018  . Visit for monitoring Tikosyn therapy 03/26/2017    Assessment: Pt is a 69 yo M with atrial fibrillation on dabigatran PTA. Pt reports last dose was the morning of 06/23/19.   Heparin for IABP placement 5/7. Heparin level 0.52 just above goal, CBC stable.  Goal of Therapy:  Heparin level 0.3-0.5 Monitor platelets by anticoagulation protocol: Yes   Plan:  Reduce heparin to 1300 units/h Daily heparin level and CBC   Glenn [name], PharmD, BCPS Clinical Pharmacist  [phone] Please check AMION for all [place] numbers 06/30/2019

## 2019-07-01 ENCOUNTER — Inpatient Hospital Stay (HOSPITAL_COMMUNITY): Payer: Medicare (Managed Care)

## 2019-07-01 LAB — CBC
HCT: 35 % — ABNORMAL LOW (ref 39.0–52.0)
Hemoglobin: 11.4 g/dL — ABNORMAL LOW (ref 13.0–17.0)
MCH: 29.8 pg (ref 26.0–34.0)
MCHC: 32.6 g/dL (ref 30.0–36.0)
MCV: 91.6 fL (ref 80.0–100.0)
Platelets: 147 10*3/uL — ABNORMAL LOW (ref 150–400)
RBC: 3.82 MIL/uL — ABNORMAL LOW (ref 4.22–5.81)
RDW: 13.7 % (ref 11.5–15.5)
WBC: 5 10*3/uL (ref 4.0–10.5)
nRBC: 0 % (ref 0.0–0.2)

## 2019-07-01 LAB — BASIC METABOLIC PANEL
Anion gap: 15 (ref 5–15)
BUN: 25 mg/dL — ABNORMAL HIGH (ref 8–23)
CO2: 28 mmol/L (ref 22–32)
Calcium: 8.9 mg/dL (ref 8.9–10.3)
Chloride: 88 mmol/L — ABNORMAL LOW (ref 98–111)
Creatinine, Ser: 2.38 mg/dL — ABNORMAL HIGH (ref 0.61–1.24)
GFR calc Af Amer: 31 mL/min — ABNORMAL LOW (ref >60)
GFR calc non Af Amer: 27 mL/min — ABNORMAL LOW (ref >60)
Glucose, Bld: 138 mg/dL — ABNORMAL HIGH (ref 70–99)
Potassium: 4.1 mmol/L (ref 3.5–5.1)
Sodium: 131 mmol/L — ABNORMAL LOW (ref 135–145)

## 2019-07-01 LAB — COOXEMETRY PANEL
Carboxyhemoglobin: 2.4 % — ABNORMAL HIGH (ref 0.5–1.5)
Methemoglobin: 1 % (ref 0.0–1.5)
O2 Saturation: 58.1 %
Total hemoglobin: 12.2 g/dL (ref 12.0–16.0)

## 2019-07-01 LAB — HEPARIN LEVEL (UNFRACTIONATED): Heparin Unfractionated: 0.58 IU/mL (ref 0.30–0.70)

## 2019-07-01 MED ORDER — DOCUSATE SODIUM 100 MG PO CAPS
100.0000 mg | ORAL_CAPSULE | Freq: Two times a day (BID) | ORAL | Status: DC
  Administered 2019-07-01 – 2019-07-14 (×19): 100 mg via ORAL
  Filled 2019-07-01 (×24): qty 1

## 2019-07-01 MED ORDER — FUROSEMIDE 10 MG/ML IJ SOLN
80.0000 mg | Freq: Once | INTRAMUSCULAR | Status: AC
  Administered 2019-07-01: 80 mg via INTRAVENOUS
  Filled 2019-07-01: qty 8

## 2019-07-01 MED ORDER — MAGNESIUM CITRATE PO SOLN
1.0000 | Freq: Once | ORAL | Status: AC
  Administered 2019-07-01: 1 via ORAL
  Filled 2019-07-01: qty 296

## 2019-07-01 NOTE — Plan of Care (Signed)
Stable during shift.   Rested well after prn trazadone. IABP remains on 1:1 with good augmentation. Approx 0300, back in Afib, rate controlled. Heparin gtt, heparin level slightly elevated, pharmacy dosing. No bleeding issues noted.   Milrinone, Amio gtts continue. CVP 6-8, CO > 4. Coox 58.1.   Decr urine output of 200 ml, condom cath in place, monitoring with bladder scan. Lasix 80 mg IV give this am with 450 mL out so far.    Problem: Education: Goal: Knowledge of General Education information will improve Description: Including pain rating scale, medication(s)/side effects and non-pharmacologic comfort measures Outcome: Progressing   Problem: Health Behavior/Discharge Planning: Goal: Ability to manage health-related needs will improve Outcome: Progressing   Problem: Clinical Measurements: Goal: Ability to maintain clinical measurements within normal limits will improve Outcome: Progressing Goal: Will remain free from infection Outcome: Progressing Goal: Diagnostic test results will improve Outcome: Progressing Goal: Respiratory complications will improve Outcome: Progressing Goal: Cardiovascular complication will be avoided Outcome: Progressing   Problem: Nutrition: Goal: Adequate nutrition will be maintained Outcome: Progressing   Problem: Coping: Goal: Level of anxiety will decrease Outcome: Progressing   Problem: Elimination: Goal: Will not experience complications related to urinary retention Outcome: Progressing   Problem: Pain Managment: Goal: General experience of comfort will improve Outcome: Progressing   Problem: Safety: Goal: Ability to remain free from injury will improve Outcome: Progressing   Problem: Skin Integrity: Goal: Risk for impaired skin integrity will decrease Outcome: Progressing   Problem: Cardiovascular: Goal: Ability to achieve and maintain adequate cardiovascular perfusion will improve Outcome: Progressing Goal: Vascular access  site(s) Level 0-1 will be maintained Outcome: Progressing

## 2019-07-01 NOTE — Progress Notes (Addendum)
Advanced Heart Failure Rounding Note   Subjective:    IABP placed on 5/6. Remains on 1:1 with good augmentation.  Remains on milrinone 0.125.   Yesterday had TEE/DC-CV with conversion to NSR. Back in A fib this morning. Remains on amio drip 60 mg per hour.   Urine output dropped this morning. Given 80 mg IV lasix.   Denies SOB. Feels good.   Swan numbers on milrinone 0.125 mcg. Off norepi this morning.  CVP 5 PA 32/14 (20) PAPi 3.6  Thermo Co/CI 4.1/2.2  Co-ox 58%   Objective:   Weight Range:  Vital Signs:   Temp:  [96.3 F (35.7 C)-98.8 F (37.1 C)] 96.3 F (35.7 C) (05/12 0600) Pulse Rate:  [77-160] 104 (05/12 0600) Resp:  [9-28] 14 (05/12 0600) BP: (121-157)/(42-98) 133/87 (05/12 0600) SpO2:  [95 %-100 %] 98 % (05/12 0600) FiO2 (%):  [21 %] 21 % (05/11 0850) Weight:  [72.4 kg] 72.4 kg (05/12 0400) Last BM Date: (prior to admit)  Weight change: Filed Weights   06/29/19 0500 06/30/19 0500 07/01/19 0400  Weight: 76.3 kg 73.4 kg 72.4 kg    Intake/Output:   Intake/Output Summary (Last 24 hours) at 07/01/2019 0701 Last data filed at 07/01/2019 0600 Gross per 24 hour  Intake 1976.16 ml  Output 2116 ml  Net -139.84 ml    CVP 5  Physical Exam: General:  No resp difficulty HEENT: normal Neck: supple. no JVD. Carotids 2+ bilat; no bruits. No lymphadenopathy or thryomegaly appreciated. RIJ  Cor: PMI nondisplaced. Irregular rate & rhythm. No rubs, gallops or murmurs. Lungs: clear Abdomen: soft, nontender, nondistended. No hepatosplenomegaly. No bruits or masses. Good bowel sounds. Extremities: no cyanosis, clubbing, rash, edema. R groin IABP  Neuro: alert & orientedx3, cranial nerves grossly intact. moves all 4 extremities w/o difficulty. Affect pleasant    Telemetry:  Converted to A fib around 0300 with rates 90-100s   Labs: Basic Metabolic Panel: Recent Labs  Lab 06/25/19 0735 06/25/19 2123 06/26/19 0511 06/26/19 1759 06/27/19 0500 06/27/19 0500  06/28/19 0601 06/28/19 0601 06/29/19 0500 06/29/19 1011 06/30/19 0400 07/01/19 0350  NA  --    < > 135   < > 136  --  133*  --  133*  --  131* 131*  K  --    < > 4.5   < > 3.6  --  3.8  --  4.5  --  4.9 4.1  CL  --    < > 105   < > 102  --  97*  --  95*  --  92* 88*  CO2  --    < > 21*   < > 24  --  24  --  27  --  30 28  GLUCOSE  --    < > 140*   < > 122*  --  135*  --  122*  --  135* 138*  BUN  --    < > 32*   < > 31*  --  26*  --  26*  --  25* 25*  CREATININE  --    < > 2.58*   < > 2.47*  --  2.12*  --  2.23*  --  2.44* 2.38*  CALCIUM  --    < > 8.5*   < > 8.6*   < > 8.4*   < > 8.9  --  9.0 8.9  MG 1.7  --  2.4  --   --   --   --   --   --  1.5*  --   --    < > = values in this interval not displayed.    Liver Function Tests: Recent Labs  Lab 06/25/19 0421 06/26/19 1759  AST 55* 42*  ALT 41 39  ALKPHOS 67 70  BILITOT 1.1 1.2  PROT 6.4* 6.7  ALBUMIN 3.3* 3.3*   No results for input(s): LIPASE, AMYLASE in the last 168 hours. No results for input(s): AMMONIA in the last 168 hours.  CBC: Recent Labs  Lab 06/25/19 0421 06/26/19 0511 06/27/19 0500 06/28/19 0601 06/29/19 0500 06/30/19 0400 07/01/19 0350  WBC 7.4   < > 7.6 6.8 6.8 7.7 5.0  NEUTROABS 5.8  --   --   --   --   --   --   HGB 11.3*   < > 11.2* 11.0* 11.2* 11.9* 11.4*  HCT 35.0*   < > 34.9* 34.3* 33.6* 36.2* 35.0*  MCV 94.9   < > 93.8 92.7 91.6 90.0 91.6  PLT 200   < > 191 161 143* 161 147*   < > = values in this interval not displayed.    Cardiac Enzymes: No results for input(s): CKTOTAL, CKMB, CKMBINDEX, TROPONINI in the last 168 hours.  BNP: BNP (last 3 results) Recent Labs    02/03/19 2007 06/01/19 1413 06/23/19 1527  BNP 922.2* 531.4* 613.2*    ProBNP (last 3 results) No results for input(s): PROBNP in the last 8760 hours.    Other results:  Imaging: DG CHEST PORT 1 VIEW  Result Date: 06/30/2019 CLINICAL DATA:  Post tee Ack of EXAM: PORTABLE CHEST 1 VIEW COMPARISON:  Portable exam  0950 hours compared to 0556 hours FINDINGS: LEFT subclavian AICD with lead projecting over RIGHT ventricle. RIGHT jugular Swan-Ganz catheter with tip projecting over descending interlobar pulmonary artery at RIGHT pulmonary hilum. Intra-aortic balloon pump with tip projecting over inferior aspect of aortic arch. Enlargement of cardiac silhouette with pulmonary vascular congestion. Atherosclerotic calcification aorta. Residual perihilar and RIGHT basilar infiltrates likely pulmonary edema. Tiny RIGHT pleural effusion. No pneumothorax. IMPRESSION: Enlargement of cardiac silhouette with mild residual pulmonary edema. Electronically Signed   By: Lavonia Dana M.D.   On: 06/30/2019 10:17   DG CHEST PORT 1 VIEW  Result Date: 06/30/2019 CLINICAL DATA:  Intra-aortic balloon pump. EXAM: PORTABLE CHEST 1 VIEW COMPARISON:  Chest x-ray dated Jun 25, 2019. FINDINGS: New IABP radiopaque marker in the proximal descending thoracic aorta at the level of the AP window. Unchanged left chest wall pacemaker. Interval retraction of the Swan-Ganz catheter with the tip now in the distal right pulmonary artery. Unchanged cardiomegaly with improved interstitial pulmonary edema and vascular congestion. Unchanged small right pleural effusion. No consolidation or pneumothorax. No acute osseous abnormality. Old right-sided rib fractures again noted. Partially visualized catheter tubing in the right upper quadrant. IMPRESSION: 1. Appropriately positioned IABP. 2. Improving interstitial pulmonary edema and vascular congestion. Unchanged small right pleural effusion. 3. Interval retraction of the Swan-Ganz catheter with tip now in the distal right pulmonary artery. Electronically Signed   By: Titus Dubin M.D.   On: 06/30/2019 08:47     Medications:     Scheduled Medications: . atorvastatin  20 mg Oral Daily  . Chlorhexidine Gluconate Cloth  6 each Topical Daily  . pantoprazole  40 mg Oral Daily  . sodium chloride flush  3 mL  Intravenous Q12H    Infusions: . sodium chloride 20 mL/hr at 07/01/19 0600  . amiodarone 60 mg/hr (07/01/19 0600)  . heparin 1,300  Units/hr (07/01/19 0600)  . milrinone 0.125 mcg/kg/min (07/01/19 0600)  . norepinephrine (LEVOPHED) Adult infusion 1 mcg/min (07/01/19 0600)    PRN Medications: acetaminophen **OR** acetaminophen, calcium carbonate, lidocaine (PF), ondansetron **OR** ondansetron (ZOFRAN) IV, sodium chloride flush, traMADol, traZODone   Assessment/Plan:   1. Acute on Chronic Combined Systolic and Diastolic Heart Failure -> cardiogenic shock - 2/2 NICM. HF dates back to 2015.  - Most recent echo 4/21 w/ EF <20. RV mildly reduced. GIIIDD. Moderate TR. No LVH.  - [facility] 10/2018 showed normal cors, elevated filling pressures and low CI at 1.9  - Now admitted w/ NYHA IV symptoms, volume overload and worsening renal function, likely cardiorenal syndrome. SCr now 2.97 (prior baseline 1.2). BP ok. No hypotension  - [facility] 5/5 showed Low output biventricular failure and marked volume overload. PCWP 27. CI 2.0. - IABP placed 5/7 on 1:1 with good augmentation - On milrinone 0.125. CO-OX stable 58%. - Volume status stable. Hold lasix for now.  - Stable this morning. Will need to continue milrinone until IABP removed. Rate controlled off norepi.   - He remains in a very difficult spot. He is improved but remains IABP and inotrope dependent.   2. AKI - due to shock/cardiorenal - Creatinine trending back up 3>2.58 > 2.47 > 2.2 > 2.4->2.4    3. Persistent Atrial Fibrillation w/ RVR - Recent DCCV attempt aborted 4/20 due to LAA thrombi on TEE - S/P DC-CV 06/30/19 with brief conversion to NSR but back in A fib this morning.  - use of digoxin limited due to AKI w/ SCr >2 - Continue amio drip 60 mg per hour. V rates improved -> 90s-100s  - will avoid?blocker currently given low output and acute decompensation - EP following for potential AV node ablation but ideally would be able to  upgrade to CRT first  4. NSVT - in the setting of severe LV dysfunction and milrinone  - continue IV amiodarone  - he has ICD - keep K >4.0 and Mg >2.0  - K stable.   5. Chronic Cholecystitis  - has perc biliary drain, stable. AF.  - WBC stable.  - management per primary team/ surgery   6. Hypokalemia - Potassium stable.   Will discuss next steps with Dr Haroldine Laws.   Length of Stay: [place] NP-C  07/01/2019, 7:01 AM  Advanced Heart Failure Team Pager [phone] (M-F; 7a - 4p)  Please contact [facility] for night-coverage after hours (4p -7a ) and weekends on [email or web address]  Agree with above.   Severe biventricular dysfunction on TEE. Remains on milrinone and IABP. Cardioverted yesterday but back in AF this am. Hemodynamics remain marginal.   General:  Lying in bed . No resp difficulty HEENT: normal Neck: supple. RIJ swan. Carotids 2+ bilat; no bruits. No lymphadenopathy or thryomegaly appreciated. Cor: PMI nondisplaced. Irregular rate & rhythm. No rubs, gallops or murmurs. Lungs: clear Abdomen: soft, nontender, nondistended. No hepatosplenomegaly. No bruits or masses. Good bowel sounds. Extremities: no cyanosis, clubbing, rash, edema IABP site ok Neuro: alert & orientedx3, cranial nerves grossly intact. moves all 4 extremities w/o difficulty. Affect pleasant  Back in AF today. Hemodynamics marginal but ok. Would not diurese further at this point. Will discuss next steps with EP regarding if now is the time to try AVN ablation with LV pacing or re-attempts DC-CV in next day or two. Can pull swan.  Need to get IABP out soon.   CRITICAL CARE Performed by: Glori Bickers  Total critical care time: 35 minutes  Critical care time was exclusive of separately billable procedures and treating other patients.  Critical care was necessary to treat or prevent imminent or life-threatening deterioration.  Critical care was time spent personally by me (independent of  midlevel providers or residents) on the following activities: development of treatment plan with patient and/or surrogate as well as nursing, discussions with consultants, evaluation of patient's response to treatment, examination of patient, obtaining history from patient or surrogate, ordering and performing treatments and interventions, ordering and review of laboratory studies, ordering and review of radiographic studies, pulse oximetry and re-evaluation of patient's condition.   Glori Bickers, MD  8:57 AM

## 2019-07-01 NOTE — Progress Notes (Signed)
[facility] for Heparin Indication: atrial fibrillation, IABP  Allergies  Allergen Reactions  . Benadryl [Diphenhydramine] Palpitations    Patient Measurements: Height: 5\' 7"  (170.2 cm) Weight: 72.4 kg (159 lb 9.8 oz) IBW/kg (Calculated) : 66.1 Heparin Dosing Weight: 76.2 kg  Vital Signs: Temp: 96.3 F (35.7 C) (05/12 0600) Temp Source: Core (05/12 0400) BP: 133/87 (05/12 0600) Pulse Rate: 104 (05/12 0600)  Labs: Recent Labs    06/29/19 0500 06/29/19 0500 06/29/19 1625 06/29/19 2313 06/30/19 0400 07/01/19 0350  HGB 11.2*   < >  --   --  11.9* 11.4*  HCT 33.6*  --   --   --  36.2* 35.0*  PLT 143*  --   --   --  161 147*  HEPARINUNFRC 0.17*  --    < > 0.44 0.52 0.58  CREATININE 2.23*  --   --   --  2.44* 2.38*   < > = values in this interval not displayed.    Estimated Creatinine Clearance: 28.2 mL/min (A) (by C-G formula based on SCr of 2.38 mg/dL (H)).   Medical History: Past Medical History:  Diagnosis Date  . Acute blood loss anemia   . Acute CVA (cerebrovascular accident) ([place])   . Acute deep vein thrombosis (DVT) of both lower extremities ([name])   . Acute kidney injury ([place])   . Acute on chronic combined systolic and diastolic CHF (congestive heart failure) ([place])   . Acute renal failure superimposed on stage 3a chronic kidney disease ([place])   . Acute respiratory failure with hypoxia ([place])   . Atrial fibrillation ([place])   . Carpal tunnel syndrome of right wrist 02/28/2018  . Cerebral edema ([place]) 11/13/2018  . Cerebral infarction ([place])   . CHF (congestive heart failure) ([place])   . Cholecystitis 02/04/2019  . Chronic right hip pain   . DCM (dilated cardiomyopathy) ([place])   . Dysphagia, post-stroke   . Elevated troponin   . Entrapment of right ulnar nerve 02/28/2018  . Headache due to intracranial disease 11/14/2018  . Hepatitis C   . History of hemorrhagic stroke with residual hemiparesis ([place]) 02/04/2019  . HTN  (hypertension) 08/14/2016  . Hyperlipidemia LDL goal <70 11/13/2018  . Hypertension   . ICD (implantable cardioverter-defibrillator) in place 09/13/2016  . Impotence due to erectile dysfunction 09/30/2017  . Ischemic cardiomyopathy   . Labile blood glucose   . Left leg DVT ([place]) 02/04/2019  . Marijuana user 11/13/2018  . Paroxysmal atrial fibrillation ([name])   . Right middle cerebral artery stroke ([place]) 11/13/2018  . Solitary pulmonary nodule 06/10/2017   5 mm RUL nodule noted incidentally as part of CVA workup 08/2016. With smoking history would obtain low-dose CT scan 08/2017.   . Stroke (cerebrum) ([name]) Lg L MCA infarct w/ hemorrhagic conversion, embolic d/t AF 0/24/0973  . Stroke ([place])   . Trochanteric bursitis, right hip 11/14/2018  . Visit for monitoring Tikosyn therapy 03/26/2017    Assessment: Pt is a 69 yo M with atrial fibrillation on dabigatran PTA. Pt reports last dose was the morning of 06/23/19.   Heparin for IABP placement 5/7. Heparin level 0.58 above goal, CBC stable.  Goal of Therapy:  Heparin level 0.3-0.5 Monitor platelets by anticoagulation protocol: Yes   Plan:  Reduce heparin to 1200 units/h Daily heparin level and CBC   Arrie Senate, PharmD, BCPS Clinical Pharmacist [phone] Please check AMION for all [facility] Pharmacy numbers 07/01/2019

## 2019-07-01 NOTE — Progress Notes (Signed)
[place] #s   CVP 4 PA 31/18 [place] 3.2  Discussed with Dr Haroldine Laws. Ok to remove swan and will leave introducer.   Janica Eldred NP-C  2:08 PM

## 2019-07-01 NOTE — Progress Notes (Addendum)
Electrophysiology Rounding Note  Patient Name: Glenn [name] Date of Encounter: 07/01/2019  Primary Cardiologist: Virl Axe, MD AHF: Dr. Haroldine Laws    Subjective   Cardioverted yesterday, and maintained NSR until about 0300 this am.   Pt feeling overall better today. Denies SOB. Remains on IABP and milrinone  Inpatient Medications    Scheduled Meds: . atorvastatin  20 mg Oral Daily  . Chlorhexidine Gluconate Cloth  6 each Topical Daily  . pantoprazole  40 mg Oral Daily  . sodium chloride flush  3 mL Intravenous Q12H   Continuous Infusions: . sodium chloride 20 mL/hr at 07/01/19 0900  . amiodarone 60 mg/hr (07/01/19 0900)  . heparin 1,200 Units/hr (07/01/19 0900)  . milrinone 0.125 mcg/kg/min (07/01/19 0900)  . norepinephrine (LEVOPHED) Adult infusion Stopped (07/01/19 0656)   PRN Meds: acetaminophen **OR** acetaminophen, calcium carbonate, lidocaine (PF), ondansetron **OR** ondansetron (ZOFRAN) IV, sodium chloride flush, traMADol, traZODone   Vital Signs    Vitals:   07/01/19 0700 07/01/19 0724 07/01/19 0800 07/01/19 0830  BP: 137/74  (!) 114/54   Pulse: 91 95 94 96  Resp: 10 11 11 12   Temp: (!) 97 F (36.1 C) (!) 96.6 F (35.9 C) (!) 97.2 F (36.2 C) (!) 96.4 F (35.8 C)  TempSrc:      SpO2: 97% 97% 99% 97%  Weight:      Height:        Intake/Output Summary (Last 24 hours) at 07/01/2019 0916 Last data filed at 07/01/2019 0900 Gross per 24 hour  Intake 2019.45 ml  Output 2151 ml  Net -131.55 ml   Filed Weights   06/29/19 0500 06/30/19 0500 07/01/19 0400  Weight: 76.3 kg 73.4 kg 72.4 kg    Physical Exam    GEN- The patient is well appearing, alert and oriented x 3 today.   Head- normocephalic, atraumatic Eyes-  Sclera clear, conjunctiva pink Ears- hearing intact Oropharynx- clear Neck- supple Lungs- Clear to ausculation bilaterally, normal work of breathing Heart- Irregularly irregular rate and rhythm, no murmurs, rubs or gallops GI-  soft, NT, ND, + BS Extremities- no clubbing, cyanosis, or edema Skin- no rash or lesion Psych- euthymic mood, full affect Neuro- strength and sensation are intact  Labs    CBC Recent Labs    06/30/19 0400 07/01/19 0350  WBC 7.7 5.0  HGB 11.9* 11.4*  HCT 36.2* 35.0*  MCV 90.0 91.6  PLT 161 078*   Basic Metabolic Panel Recent Labs    06/29/19 0500 06/29/19 1011 06/30/19 0400 07/01/19 0350  NA   < >  --  131* 131*  K   < >  --  4.9 4.1  CL   < >  --  92* 88*  CO2   < >  --  30 28  GLUCOSE   < >  --  135* 138*  BUN   < >  --  25* 25*  CREATININE   < >  --  2.44* 2.38*  CALCIUM   < >  --  9.0 8.9  MG  --  1.5*  --   --    < > = values in this interval not displayed.   Liver Function Tests No results for input(s): AST, ALT, ALKPHOS, BILITOT, PROT, ALBUMIN in the last 72 hours. No results for input(s): LIPASE, AMYLASE in the last 72 hours. Cardiac Enzymes No results for input(s): CKTOTAL, CKMB, CKMBINDEX, TROPONINI in the last 72 hours.   Telemetry    NSR until ~  0300 this am. Now in AF with rates in 80-100s (personally reviewed)  Radiology    DG CHEST PORT 1 VIEW  Result Date: 07/01/2019 CLINICAL DATA:  Shortness of breath.  On IABP. EXAM: PORTABLE CHEST 1 VIEW COMPARISON:  Chest x-rays from yesterday. FINDINGS: Unchanged left chest wall pacemaker and right internal jugular Swan-Ganz catheter. Unchanged IABP radiopaque marker in the proximal descending thoracic aorta at the level of the AP window. Unchanged cardiomegaly and pulmonary vascular congestion. Unchanged right basilar interstitial and airspace opacities with trace right pleural effusion. The left lung is clear. No pneumothorax. No acute osseous abnormality. Partially visualized pigtail drainage catheter in the right upper quadrant. IMPRESSION: 1. Unchanged right basilar pulmonary edema and trace right pleural effusion. Electronically Signed   By: Titus Dubin M.D.   On: 07/01/2019 08:16   DG CHEST PORT 1  VIEW  Result Date: 06/30/2019 CLINICAL DATA:  Post tee Ack of EXAM: PORTABLE CHEST 1 VIEW COMPARISON:  Portable exam 0950 hours compared to 0556 hours FINDINGS: LEFT subclavian AICD with lead projecting over RIGHT ventricle. RIGHT jugular Swan-Ganz catheter with tip projecting over descending interlobar pulmonary artery at RIGHT pulmonary hilum. Intra-aortic balloon pump with tip projecting over inferior aspect of aortic arch. Enlargement of cardiac silhouette with pulmonary vascular congestion. Atherosclerotic calcification aorta. Residual perihilar and RIGHT basilar infiltrates likely pulmonary edema. Tiny RIGHT pleural effusion. No pneumothorax. IMPRESSION: Enlargement of cardiac silhouette with mild residual pulmonary edema. Electronically Signed   By: Lavonia Dana M.D.   On: 06/30/2019 10:17   DG CHEST PORT 1 VIEW  Result Date: 06/30/2019 CLINICAL DATA:  Intra-aortic balloon pump. EXAM: PORTABLE CHEST 1 VIEW COMPARISON:  Chest x-ray dated Jun 25, 2019. FINDINGS: New IABP radiopaque marker in the proximal descending thoracic aorta at the level of the AP window. Unchanged left chest wall pacemaker. Interval retraction of the Swan-Ganz catheter with the tip now in the distal right pulmonary artery. Unchanged cardiomegaly with improved interstitial pulmonary edema and vascular congestion. Unchanged small right pleural effusion. No consolidation or pneumothorax. No acute osseous abnormality. Old right-sided rib fractures again noted. Partially visualized catheter tubing in the right upper quadrant. IMPRESSION: 1. Appropriately positioned IABP. 2. Improving interstitial pulmonary edema and vascular congestion. Unchanged small right pleural effusion. 3. Interval retraction of the Swan-Ganz catheter with tip now in the distal right pulmonary artery. Electronically Signed   By: Titus Dubin M.D.   On: 06/30/2019 08:47    Patient Profile     Glenn Carbonneau Dejournetteis a 69 y.o.malewith a past medical history  significant for medical non-compliance, chronic systolic CHF, CVAs with hemorrhagic conversion, AF with RVR.hewas admitted for Acute on chronic systolic CHF with cardiogenic shock in the setting of AF with RVR.    Assessment & Plan    1. AF with RVR TEE 06/30/2019 with resolution of thrombus and subsequent DCCV.  He went back into AF around 0300 this am, rates remain controlled on amiodarone.  Dr. [name] and Dr. Haroldine Laws to discuss best plan. DCCV vs AVN ablation +/- CRT upgrade.  2. Acute on chronic systolic CHF, cardiogenic shock Remains on IABP and milrinone Holding lasix today.   3. AKI Cr 2.1 -> 2.2 -> 2.4 -> 2.38  For questions or updates, please contact [place] Please consult [email or web address] for contact info under Cardiology/STEMI.  Signed, [name], PA-C  07/01/2019, 9:16 AM   Reverted to atrial fib,  Would consider repeat cardioversion and agree with trying ranolazine for antiarrhythmic adjunctive support  HR  much improved--   Would also consider removing the balloon pump and seeing if he can be supported medically-- AV ablation and CRT will add nothing to his current hemodynamics but couold be helpful if afib RVR recurred  Renal function stable

## 2019-07-02 ENCOUNTER — Inpatient Hospital Stay (HOSPITAL_COMMUNITY): Payer: Medicare (Managed Care) | Admitting: Certified Registered Nurse Anesthetist

## 2019-07-02 ENCOUNTER — Encounter (HOSPITAL_COMMUNITY)
Admission: EM | Disposition: A | Discharge status: Skilled Nursing Facility | Payer: Self-pay | Source: Home / Self Care | Attending: Internal Medicine

## 2019-07-02 HISTORY — PX: CARDIOVERSION: SHX1299

## 2019-07-02 LAB — COOXEMETRY PANEL
Carboxyhemoglobin: 2.1 % — ABNORMAL HIGH (ref 0.5–1.5)
Carboxyhemoglobin: 2.1 % — ABNORMAL HIGH (ref 0.5–1.5)
Carboxyhemoglobin: 1.9 % — ABNORMAL HIGH (ref 0.5–1.5)
Methemoglobin: 0.9 % (ref 0.0–1.5)
Methemoglobin: 0.6 % (ref 0.0–1.5)
Methemoglobin: 0.5 % (ref 0.0–1.5)
O2 Saturation: 47.5 %
O2 Saturation: 66.7 %
O2 Saturation: 66.2 %
Total hemoglobin: 13.4 g/dL (ref 12.0–16.0)
Total hemoglobin: 12.4 g/dL (ref 12.0–16.0)
Total hemoglobin: 12.2 g/dL (ref 12.0–16.0)

## 2019-07-02 LAB — BASIC METABOLIC PANEL
Anion gap: 12 (ref 5–15)
BUN: 20 mg/dL (ref 8–23)
CO2: 31 mmol/L (ref 22–32)
Calcium: 9.3 mg/dL (ref 8.9–10.3)
Chloride: 88 mmol/L — ABNORMAL LOW (ref 98–111)
Creatinine, Ser: 2.25 mg/dL — ABNORMAL HIGH (ref 0.61–1.24)
GFR calc Af Amer: 34 mL/min — ABNORMAL LOW (ref >60)
GFR calc non Af Amer: 29 mL/min — ABNORMAL LOW (ref >60)
Glucose, Bld: 122 mg/dL — ABNORMAL HIGH (ref 70–99)
Potassium: 4.1 mmol/L (ref 3.5–5.1)
Sodium: 131 mmol/L — ABNORMAL LOW (ref 135–145)

## 2019-07-02 LAB — POCT I-STAT EG7
Acid-Base Excess: 9 mmol/L — ABNORMAL HIGH (ref 0.0–2.0)
Acid-Base Excess: 9 mmol/L — ABNORMAL HIGH (ref 0.0–2.0)
Bicarbonate: 34.6 mmol/L — ABNORMAL HIGH (ref 20.0–28.0)
Bicarbonate: 35.6 mmol/L — ABNORMAL HIGH (ref 20.0–28.0)
Calcium, Ion: 1.18 mmol/L (ref 1.15–1.40)
Calcium, Ion: 1.18 mmol/L (ref 1.15–1.40)
HCT: 39 % (ref 39.0–52.0)
HCT: 39 % (ref 39.0–52.0)
Hemoglobin: 13.3 g/dL (ref 13.0–17.0)
Hemoglobin: 13.3 g/dL (ref 13.0–17.0)
O2 Saturation: 64 %
O2 Saturation: 63 %
Patient temperature: 97.8
Patient temperature: 97.8
Potassium: 3.3 mmol/L — ABNORMAL LOW (ref 3.5–5.1)
Potassium: 3.3 mmol/L — ABNORMAL LOW (ref 3.5–5.1)
Sodium: 132 mmol/L — ABNORMAL LOW (ref 135–145)
Sodium: 131 mmol/L — ABNORMAL LOW (ref 135–145)
TCO2: 36 mmol/L — ABNORMAL HIGH (ref 22–32)
TCO2: 37 mmol/L — ABNORMAL HIGH (ref 22–32)
pCO2, Ven: 49.3 mmHg (ref 44.0–60.0)
pCO2, Ven: 52.3 mmHg (ref 44.0–60.0)
pH, Ven: 7.452 — ABNORMAL HIGH (ref 7.250–7.430)
pH, Ven: 7.438 — ABNORMAL HIGH (ref 7.250–7.430)
pO2, Ven: 31 mmHg — CL (ref 32.0–45.0)
pO2, Ven: 31 mmHg — CL (ref 32.0–45.0)

## 2019-07-02 LAB — PROTIME-INR
INR: 1.2 (ref 0.8–1.2)
Prothrombin Time: 14.5 seconds (ref 11.4–15.2)

## 2019-07-02 LAB — CBC
HCT: 38.9 % — ABNORMAL LOW (ref 39.0–52.0)
Hemoglobin: 12.8 g/dL — ABNORMAL LOW (ref 13.0–17.0)
MCH: 29.6 pg (ref 26.0–34.0)
MCHC: 32.9 g/dL (ref 30.0–36.0)
MCV: 89.8 fL (ref 80.0–100.0)
Platelets: 159 10*3/uL (ref 150–400)
RBC: 4.33 MIL/uL (ref 4.22–5.81)
RDW: 13.5 % (ref 11.5–15.5)
WBC: 6.4 10*3/uL (ref 4.0–10.5)
nRBC: 0 % (ref 0.0–0.2)

## 2019-07-02 LAB — HEPARIN LEVEL (UNFRACTIONATED)
Heparin Unfractionated: 0.69 IU/mL (ref 0.30–0.70)
Heparin Unfractionated: 0.29 IU/mL — ABNORMAL LOW (ref 0.30–0.70)

## 2019-07-02 LAB — MAGNESIUM: Magnesium: 1.8 mg/dL (ref 1.7–2.4)

## 2019-07-02 SURGERY — CARDIOVERSION
Anesthesia: General

## 2019-07-02 MED ORDER — SODIUM CHLORIDE 0.9 % IV BOLUS
250.0000 mL | Freq: Once | INTRAVENOUS | Status: AC
  Administered 2019-07-02: 250 mL via INTRAVENOUS

## 2019-07-02 MED ORDER — ETOMIDATE 2 MG/ML IV SOLN
INTRAVENOUS | Status: DC | PRN
Start: 2019-07-02 — End: 2019-07-02
  Administered 2019-07-02: 7 mg via INTRAVENOUS

## 2019-07-02 MED ORDER — MAGNESIUM SULFATE 2 GM/50ML IV SOLN
2.0000 g | Freq: Once | INTRAVENOUS | Status: AC
  Administered 2019-07-02: 2 g via INTRAVENOUS
  Filled 2019-07-02: qty 50

## 2019-07-02 NOTE — CV Procedure (Signed)
    DIRECT CURRENT CARDIOVERSION  NAME:  Glenn [name]   MRN: [id number] DOB:  Nov 05, 1950   ADMIT DATE: 06/23/2019   INDICATIONS: Atrial fibrillation    PROCEDURE:   Informed consent was obtained prior to the procedure. The risks, benefits and alternatives for the procedure were discussed and the patient comprehended these risks. Once an appropriate time out was taken, the patient had the defibrillator pads placed in the anterior and posterior position. The patient then underwent sedation by the anesthesia service. Once an appropriate level of sedation was achieved, the patient received a single biphasic, synchronized 200J shock with prompt conversion to sinus rhythm. No apparent complications.  Glori Bickers, MD  1:48 PM

## 2019-07-02 NOTE — Anesthesia Postprocedure Evaluation (Signed)
Anesthesia Post Note  Patient: Glenn [name]  Procedure(s) Performed: CARDIOVERSION (N/A )     Patient location during evaluation: SICU Anesthesia Type: General Level of consciousness: awake and alert, oriented and patient cooperative Pain management: pain level controlled Vital Signs Assessment: post-procedure vital signs reviewed and stable Respiratory status: spontaneous breathing, nonlabored ventilation, respiratory function stable and patient connected to nasal cannula oxygen Cardiovascular status: blood pressure returned to baseline (remains on inotropic and IABP support) Postop Assessment: no apparent nausea or vomiting Anesthetic complications: no    Last Vitals:  Vitals:   07/02/19 1130 07/02/19 1200  BP:    Pulse:  89  Resp:  12  Temp: 36.6 C   SpO2:  97%    Last Pain:  Vitals:   07/02/19 0800  TempSrc:   PainSc: 0-No pain                 [name],E. Devika Dragovich

## 2019-07-02 NOTE — Progress Notes (Addendum)
[place] for Heparin Indication: atrial fibrillation, IABP  Allergies  Allergen Reactions  . Benadryl [Diphenhydramine] Palpitations    Patient Measurements: Height: 5\' 7"  (170.2 cm) Weight: 74 kg (163 lb 2.3 oz) IBW/kg (Calculated) : 66.1 Heparin Dosing Weight: 76.2 kg  Vital Signs: Temp: 98.2 F (36.8 C) (05/13 0400) Temp Source: Oral (05/13 0400) BP: 136/77 (05/12 1900) Pulse Rate: 89 (05/13 0600)  Labs: Recent Labs    06/30/19 0400 06/30/19 0400 07/01/19 0350 07/02/19 0337  HGB 11.9*   < > 11.4* 12.8*  HCT 36.2*  --  35.0* 38.9*  PLT 161  --  147* 159  HEPARINUNFRC 0.52  --  0.58 0.69  CREATININE 2.44*  --  2.38* 2.25*   < > = values in this interval not displayed.    Estimated Creatinine Clearance: 29.8 mL/min (A) (by C-G formula based on SCr of 2.25 mg/dL (H)).   Medical History: Past Medical History:  Diagnosis Date  . Acute blood loss anemia   . Acute CVA (cerebrovascular accident) ([place])   . Acute deep vein thrombosis (DVT) of both lower extremities ([name])   . Acute kidney injury ([place])   . Acute on chronic combined systolic and diastolic CHF (congestive heart failure) ([place])   . Acute renal failure superimposed on stage 3a chronic kidney disease ([place])   . Acute respiratory failure with hypoxia ([place])   . Atrial fibrillation ([place])   . Carpal tunnel syndrome of right wrist 02/28/2018  . Cerebral edema ([place]) 11/13/2018  . Cerebral infarction ([place])   . CHF (congestive heart failure) ([place])   . Cholecystitis 02/04/2019  . Chronic right hip pain   . DCM (dilated cardiomyopathy) ([place])   . Dysphagia, post-stroke   . Elevated troponin   . Entrapment of right ulnar nerve 02/28/2018  . Headache due to intracranial disease 11/14/2018  . Hepatitis C   . History of hemorrhagic stroke with residual hemiparesis ([place]) 02/04/2019  . HTN (hypertension) 08/14/2016  . Hyperlipidemia LDL goal <70 11/13/2018  . Hypertension   . ICD  (implantable cardioverter-defibrillator) in place 09/13/2016  . Impotence due to erectile dysfunction 09/30/2017  . Ischemic cardiomyopathy   . Labile blood glucose   . Left leg DVT ([place]) 02/04/2019  . Marijuana user 11/13/2018  . Paroxysmal atrial fibrillation ([name])   . Right middle cerebral artery stroke ([place]) 11/13/2018  . Solitary pulmonary nodule 06/10/2017   5 mm RUL nodule noted incidentally as part of CVA workup 08/2016. With smoking history would obtain low-dose CT scan 08/2017.   . Stroke (cerebrum) ([name]) Lg L MCA infarct w/ hemorrhagic conversion, embolic d/t AF 4/33/2951  . Stroke ([place])   . Trochanteric bursitis, right hip 11/14/2018  . Visit for monitoring Tikosyn therapy 03/26/2017    Assessment: Pt is a 69 yo M with atrial fibrillation with IABP on dabigatran PTA. Pt reports last dose was the morning of 06/23/19.   Heparin for IABP placement 5/7. Heparin level is 0.69 above goal with heparin running at 1200 units/hr. Heparin is infusing through right midline and levels are being drawn from right IJ. No issues with the infusion or bleeding at this time. Hemoglobin is stable at 12.8, platelet count is WNL at 159. Will decrease heparin infusion and recheck heparin level this afternoon.   Goal of Therapy:  Heparin level 0.3-0.5 Monitor platelets by anticoagulation protocol: Yes   Plan:  Reduce heparin to 1050 units/h Check heparin level in ~8 hours Daily heparin level and  CBC  Agnes Lawrence, PharmD PGY1 Pharmacy Resident

## 2019-07-02 NOTE — Progress Notes (Signed)
[place] for Heparin Indication: atrial fibrillation, IABP  Allergies  Allergen Reactions  . Benadryl [Diphenhydramine] Palpitations    Patient Measurements: Height: 5\' 7"  (170.2 cm) Weight: 74 kg (163 lb 2.3 oz) IBW/kg (Calculated) : 66.1 Heparin Dosing Weight: 76.2 kg  Vital Signs: Temp: 97.8 F (36.6 C) (05/13 1515) Pulse Rate: 79 (05/13 1700)  Labs: Recent Labs    06/30/19 0400 06/30/19 0400 07/01/19 0350 07/02/19 0337 07/02/19 1241 07/02/19 1600  HGB 11.9*   < > 11.4* 12.8*  --   --   HCT 36.2*  --  35.0* 38.9*  --   --   PLT 161  --  147* 159  --   --   LABPROT  --   --   --   --  14.5  --   INR  --   --   --   --  1.2  --   HEPARINUNFRC 0.52   < > 0.58 0.69  --  0.29*  CREATININE 2.44*  --  2.38* 2.25*  --   --    < > = values in this interval not displayed.    Estimated Creatinine Clearance: 29.8 mL/min (A) (by C-G formula based on SCr of 2.25 mg/dL (H)).   Medical History: Past Medical History:  Diagnosis Date  . Acute blood loss anemia   . Acute CVA (cerebrovascular accident) ([place])   . Acute deep vein thrombosis (DVT) of both lower extremities ([name])   . Acute kidney injury ([place])   . Acute on chronic combined systolic and diastolic CHF (congestive heart failure) ([place])   . Acute renal failure superimposed on stage 3a chronic kidney disease ([place])   . Acute respiratory failure with hypoxia ([place])   . Atrial fibrillation ([place])   . Carpal tunnel syndrome of right wrist 02/28/2018  . Cerebral edema ([place]) 11/13/2018  . Cerebral infarction ([place])   . CHF (congestive heart failure) ([place])   . Cholecystitis 02/04/2019  . Chronic right hip pain   . DCM (dilated cardiomyopathy) ([place])   . Dysphagia, post-stroke   . Elevated troponin   . Entrapment of right ulnar nerve 02/28/2018  . Headache due to intracranial disease 11/14/2018  . Hepatitis C   . History of hemorrhagic stroke with residual hemiparesis ([place]) 02/04/2019  .  HTN (hypertension) 08/14/2016  . Hyperlipidemia LDL goal <70 11/13/2018  . Hypertension   . ICD (implantable cardioverter-defibrillator) in place 09/13/2016  . Impotence due to erectile dysfunction 09/30/2017  . Ischemic cardiomyopathy   . Labile blood glucose   . Left leg DVT ([place]) 02/04/2019  . Marijuana user 11/13/2018  . Paroxysmal atrial fibrillation ([name])   . Right middle cerebral artery stroke ([place]) 11/13/2018  . Solitary pulmonary nodule 06/10/2017   5 mm RUL nodule noted incidentally as part of CVA workup 08/2016. With smoking history would obtain low-dose CT scan 08/2017.   . Stroke (cerebrum) ([name]) Lg L MCA infarct w/ hemorrhagic conversion, embolic d/t AF 2/75/1700  . Stroke ([place])   . Trochanteric bursitis, right hip 11/14/2018  . Visit for monitoring Tikosyn therapy 03/26/2017    Assessment: Pt is a 69 yo M with atrial fibrillation with IABP on dabigatran PTA. Pt reports last dose was the morning of 06/23/19.   Heparin for IABP placement 5/7. Heparin level is 0.29 on 1100 units/hr. Heparin is infusing through right midline and levels are being drawn from right IJ. No issues with the infusion or bleeding at this time.  Hemoglobin is stable at 12.8, platelet count is WNL at 159. .   Goal of Therapy:  Heparin level 0.3-0.5 Monitor platelets by anticoagulation protocol: Yes   Plan:  Heparin level nearly at goal, but since cardioverted earlier today will increase heparin gtt just a little to 1100 units/hr. Daily heparin level and CBC  [name], [facility] Clinical Pharmacist Phone [phone]  07/02/2019 5:38 PM

## 2019-07-02 NOTE — Progress Notes (Addendum)
Advanced Heart Failure Rounding Note   Subjective:    IABP placed on 5/6. Remains on 1:1 with good augmentation.  Remains on milrinone 0.125.   06/30/19  TEE/DC-CV with conversion to NSR but went back in A fib.    Remains in A fib. CO-OX  47.5% on milrinone 0.25 mcg.   Complaining of nausea/abdominal pain.   Objective:   Weight Range:  Vital Signs:   Temp:  [97 F (36.1 C)-99 F (37.2 C)] 97.7 F (36.5 C) (05/13 0742) Pulse Rate:  [49-121] 89 (05/13 0800) Resp:  [11-26] 12 (05/13 0800) BP: (97-136)/(64-78) 136/77 (05/12 1900) SpO2:  [92 %-99 %] 94 % (05/13 0800) Weight:  [74 kg] 74 kg (05/13 0500) Last BM Date: 06/25/19  Weight change: Filed Weights   06/30/19 0500 07/01/19 0400 07/02/19 0500  Weight: 73.4 kg 72.4 kg 74 kg    Intake/Output:   Intake/Output Summary (Last 24 hours) at 07/02/2019 0917 Last data filed at 07/02/2019 0800 Gross per 24 hour  Intake 1466.33 ml  Output 1625 ml  Net -158.67 ml    CVP 1  Physical Exam: General:  Appears weak.  No resp difficulty HEENT: normal Neck: supple. no JVD. Carotids 2+ bilat; no bruits. No lymphadenopathy or thryomegaly appreciated. RIJ  Cor: PMI nondisplaced. Irregular rate & rhythm. No rubs, gallops or murmurs. Lungs: clear Abdomen: soft, nontender, nondistended. No hepatosplenomegaly. No bruits or masses. Good bowel sounds. Extremities: no cyanosis, clubbing, rash, edema. R groin IABP Neuro: alert & orientedx3, cranial nerves grossly intact. moves all 4 extremities w/o difficulty. Affect pleasant     Telemetry:  A fib 80-100s  Labs: Basic Metabolic Panel: Recent Labs  Lab 06/26/19 0511 06/26/19 1759 06/28/19 0601 06/28/19 0601 06/29/19 0500 06/29/19 0500 06/29/19 1011 06/30/19 0400 07/01/19 0350 07/02/19 0337  NA 135   < > 133*  --  133*  --   --  131* 131* 131*  K 4.5   < > 3.8  --  4.5  --   --  4.9 4.1 4.1  CL 105   < > 97*  --  95*  --   --  92* 88* 88*  CO2 21*   < > 24  --  27  --    --  30 28 31   GLUCOSE 140*   < > 135*  --  122*  --   --  135* 138* 122*  BUN 32*   < > 26*  --  26*  --   --  25* 25* 20  CREATININE 2.58*   < > 2.12*  --  2.23*  --   --  2.44* 2.38* 2.25*  CALCIUM 8.5*   < > 8.4*   < > 8.9   < >  --  9.0 8.9 9.3  MG 2.4  --   --   --   --   --  1.5*  --   --  1.8   < > = values in this interval not displayed.    Liver Function Tests: Recent Labs  Lab 06/26/19 1759  AST 42*  ALT 39  ALKPHOS 70  BILITOT 1.2  PROT 6.7  ALBUMIN 3.3*   No results for input(s): LIPASE, AMYLASE in the last 168 hours. No results for input(s): AMMONIA in the last 168 hours.  CBC: Recent Labs  Lab 06/28/19 0601 06/29/19 0500 06/30/19 0400 07/01/19 0350 07/02/19 0337  WBC 6.8 6.8 7.7 5.0 6.4  HGB 11.0* 11.2* 11.9* 11.4*  12.8*  HCT 34.3* 33.6* 36.2* 35.0* 38.9*  MCV 92.7 91.6 90.0 91.6 89.8  PLT 161 143* 161 147* 159    Cardiac Enzymes: No results for input(s): CKTOTAL, CKMB, CKMBINDEX, TROPONINI in the last 168 hours.  BNP: BNP (last 3 results) Recent Labs    02/03/19 2007 06/01/19 1413 06/23/19 1527  BNP 922.2* 531.4* 613.2*    ProBNP (last 3 results) No results for input(s): PROBNP in the last 8760 hours.    Other results:  Imaging: DG CHEST PORT 1 VIEW  Result Date: 07/01/2019 CLINICAL DATA:  Shortness of breath.  On IABP. EXAM: PORTABLE CHEST 1 VIEW COMPARISON:  Chest x-rays from yesterday. FINDINGS: Unchanged left chest wall pacemaker and right internal jugular Swan-Ganz catheter. Unchanged IABP radiopaque marker in the proximal descending thoracic aorta at the level of the AP window. Unchanged cardiomegaly and pulmonary vascular congestion. Unchanged right basilar interstitial and airspace opacities with trace right pleural effusion. The left lung is clear. No pneumothorax. No acute osseous abnormality. Partially visualized pigtail drainage catheter in the right upper quadrant. IMPRESSION: 1. Unchanged right basilar pulmonary edema and trace  right pleural effusion. Electronically Signed   By: Titus Dubin M.D.   On: 07/01/2019 08:16   DG CHEST PORT 1 VIEW  Result Date: 06/30/2019 CLINICAL DATA:  Post tee Ack of EXAM: PORTABLE CHEST 1 VIEW COMPARISON:  Portable exam 0950 hours compared to 0556 hours FINDINGS: LEFT subclavian AICD with lead projecting over RIGHT ventricle. RIGHT jugular Swan-Ganz catheter with tip projecting over descending interlobar pulmonary artery at RIGHT pulmonary hilum. Intra-aortic balloon pump with tip projecting over inferior aspect of aortic arch. Enlargement of cardiac silhouette with pulmonary vascular congestion. Atherosclerotic calcification aorta. Residual perihilar and RIGHT basilar infiltrates likely pulmonary edema. Tiny RIGHT pleural effusion. No pneumothorax. IMPRESSION: Enlargement of cardiac silhouette with mild residual pulmonary edema. Electronically Signed   By: Lavonia Dana M.D.   On: 06/30/2019 10:17     Medications:     Scheduled Medications: . atorvastatin  20 mg Oral Daily  . Chlorhexidine Gluconate Cloth  6 each Topical Daily  . docusate sodium  100 mg Oral BID  . pantoprazole  40 mg Oral Daily  . sodium chloride flush  3 mL Intravenous Q12H    Infusions: . sodium chloride 10 mL/hr at 07/02/19 0800  . amiodarone 60 mg/hr (07/02/19 0800)  . heparin 1,050 Units/hr (07/02/19 0845)  . milrinone 0.125 mcg/kg/min (07/02/19 0800)  . norepinephrine (LEVOPHED) Adult infusion Stopped (07/01/19 0656)    PRN Medications: acetaminophen **OR** acetaminophen, calcium carbonate, lidocaine (PF), ondansetron **OR** ondansetron (ZOFRAN) IV, sodium chloride flush, traMADol, traZODone   Assessment/Plan:   1. Acute on Chronic Combined Systolic and Diastolic Heart Failure -> cardiogenic shock - 2/2 NICM. HF dates back to 2015.  - Most recent echo 4/21 w/ EF <20. RV mildly reduced. GIIIDD. Moderate TR. No LVH.  - [facility] 10/2018 showed normal cors, elevated filling pressures and low CI at 1.9  -  Now admitted w/ NYHA IV symptoms, volume overload and worsening renal function, likely cardiorenal syndrome. SCr now 2.97 (prior baseline 1.2). BP ok. No hypotension  - RHC 5/5 showed Low output biventricular failure and marked volume overload. PCWP 27. CI 2.0. - IABP placed 5/7 on 1:1 with good augmentation - On milrinone 0.125. CO-OX stable, 47.5%. Symptomatic. Will need to increase milrinone to 0.5 mcg.  - CVP 1. Give 250 cc NS now. No diuretics.   2. AKI - due to shock/cardiorenal - Creatinine peaked  at 3 - Todays creatinine 2.2   3. Persistent Atrial Fibrillation w/ RVR - Recent DCCV attempt aborted 4/20 due to LAA thrombi on TEE - S/P DC-CV 06/30/19 with brief conversion to NSR but back in A fib this morning.  - use of digoxin limited due to AKI w/ SCr >2 - Continue amio drip 60 mg per hour.  Rates 80-100s  - will avoid?blocker currently given low output and acute decompensation - EP following for potential AV node ablation but ideally would be able to upgrade to CRT first - Planning for another cardioversion today.  - NPO   4. NSVT - in the setting of severe LV dysfunction and milrinone  - continue IV amiodarone  - he has ICD - keep K >4.0 and Mg >2.0  -K 4.1   5. Chronic Cholecystitis  - has perc biliary drain, stable. AF.  - WBC stable.  - management per primary team/ surgery   6. Hypokalemia - Potassium stable.    Length of Stay: [place] NP-C  07/02/2019, 9:17 AM  Advanced Heart Failure Team Pager [phone] (M-F; 7a - 4p)  Please contact [facility] for night-coverage after hours (4p -7a ) and weekends on [email or web address]  He remains critically ill. On IABP and milrinone. Having recurrent nausea concerning for low output. Co-ox down to 47%. Remains in AF  General:  Nauseated. No resp difficulty HEENT: normal Neck: supple. no JVD. Carotids 2+ bilat; no bruits. No lymphadenopathy or thryomegaly appreciated. Cor: PMI nondisplaced. Irregular rate &  rhythm. +s3 Lungs: clear Abdomen: soft, nontender, nondistended. No hepatosplenomegaly. No bruits or masses. Good bowel sounds. Extremities: no cyanosis, clubbing, rash, edema + RFA iabp Neuro: alert & orientedx3, cranial nerves grossly intact. moves all 4 extremities w/o difficulty. Affect pleasant  He is back in shock. Volume status low. Give volume back. Increase milrinone. Plan repeat DC-CV today with amio and ranexa support. Continue heparin. May need Impella 5.0 support.   CRITICAL CARE Performed by: Glori Bickers  Total critical care time: 35 minutes  Critical care time was exclusive of separately billable procedures and treating other patients.  Critical care was necessary to treat or prevent imminent or life-threatening deterioration.  Critical care was time spent personally by me (independent of midlevel providers or residents) on the following activities: development of treatment plan with patient and/or surrogate as well as nursing, discussions with consultants, evaluation of patient's response to treatment, examination of patient, obtaining history from patient or surrogate, ordering and performing treatments and interventions, ordering and review of laboratory studies, ordering and review of radiographic studies, pulse oximetry and re-evaluation of patient's condition.   Glori Bickers, MD  1:47 PM

## 2019-07-02 NOTE — Anesthesia Preprocedure Evaluation (Addendum)
Anesthesia Evaluation  Patient identified by MRN, date of birth, ID band Patient awake    Reviewed: Allergy & Precautions, NPO status , Patient's Chart, lab work & pertinent test results, reviewed documented beta blocker date and time   History of Anesthesia Complications Negative for: history of anesthetic complications  Airway Mallampati: II  TM Distance: >3 FB Neck ROM: Full    Dental  (+) Missing, Dental Advisory Given, Poor Dentition   Pulmonary COPD, Patient abstained from smoking., former smoker,  [place] O2   breath sounds clear to auscultation       Cardiovascular hypertension, Pt. on medications and Pt. on home beta blockers +CHF (entresto, IABP) and + DVT  + dysrhythmias Atrial Fibrillation + Cardiac Defibrillator  Rhythm:Irregular Rate:Normal  06/23/2019 ECHO: EF 20-25%, mod AI, severe TR   Neuro/Psych CVA, Residual Symptoms    GI/Hepatic GERD  Medicated and Controlled,(+) Hepatitis -, C  Endo/Other  negative endocrine ROS  Renal/GU Renal InsufficiencyRenal disease     Musculoskeletal   Abdominal   Peds  Hematology [name]: INR 1.8   Anesthesia Other Findings   Reproductive/Obstetrics                            Anesthesia Physical Anesthesia Plan  ASA: IV  Anesthesia Plan: General   Post-op Pain Management:    Induction: Intravenous  PONV Risk Score and Plan: 2 and Treatment may vary due to age or medical condition  Airway Management Planned: Natural Airway and Mask  Additional Equipment:   Intra-op Plan:   Post-operative Plan:   Informed Consent: I have reviewed the patients History and Physical, chart, labs and discussed the procedure including the risks, benefits and alternatives for the proposed anesthesia with the patient or authorized representative who has indicated his/her understanding and acceptance.     Dental advisory given  Plan Discussed with: CRNA and  Surgeon  Anesthesia Plan Comments:        Anesthesia Quick Evaluation

## 2019-07-02 NOTE — Anesthesia Procedure Notes (Signed)
Procedure Name: General with mask airway Performed by: Milford Cage, CRNA Pre-anesthesia Checklist: Patient identified, Emergency Drugs available, Suction available, Patient being monitored and Timeout performed Patient Re-evaluated:Patient Re-evaluated prior to induction Oxygen Delivery Method: Circle system utilized Preoxygenation: Pre-oxygenation with 100% oxygen Induction Type: IV induction Ventilation: Mask ventilation without difficulty

## 2019-07-02 NOTE — Plan of Care (Signed)
  Problem: Education: Goal: Knowledge of General Education information will improve Description: Including pain rating scale, medication(s)/side effects and non-pharmacologic comfort measures Outcome: Progressing   Problem: Health Behavior/Discharge Planning: Goal: Ability to manage health-related needs will improve Outcome: Progressing   Problem: Clinical Measurements: Goal: Ability to maintain clinical measurements within normal limits will improve Outcome: Progressing Goal: Will remain free from infection Outcome: Progressing Goal: Diagnostic test results will improve Outcome: Progressing Goal: Respiratory complications will improve Outcome: Progressing Goal: Cardiovascular complication will be avoided Outcome: Progressing   Problem: Activity: Goal: Risk for activity intolerance will decrease Outcome: Progressing   Problem: Nutrition: Goal: Adequate nutrition will be maintained Outcome: Progressing   Problem: Coping: Goal: Level of anxiety will decrease Outcome: Progressing   Problem: Elimination: Goal: Will not experience complications related to bowel motility Outcome: Progressing Goal: Will not experience complications related to urinary retention Outcome: Progressing   Problem: Pain Managment: Goal: General experience of comfort will improve Outcome: Progressing   Problem: Safety: Goal: Ability to remain free from injury will improve Outcome: Progressing   Problem: Skin Integrity: Goal: Risk for impaired skin integrity will decrease Outcome: Progressing   Problem: Cardiovascular: Goal: Ability to achieve and maintain adequate cardiovascular perfusion will improve Outcome: Progressing Goal: Vascular access site(s) Level 0-1 will be maintained Outcome: Progressing   Problem: Cardiac: Goal: Ability to achieve and maintain adequate cardiopulmonary perfusion will improve Outcome: Progressing Goal: Vascular access site(s) Level 0-1 will be  maintained Outcome: Progressing   Problem: Fluid Volume: Goal: Ability to achieve a balanced intake and output will improve Outcome: Progressing   Problem: Physical Regulation: Goal: Complications related to the disease process, condition or treatment will be avoided or minimized Outcome: Progressing   Problem: Respiratory: Goal: Will regain and/or maintain adequate ventilation Outcome: Progressing

## 2019-07-02 NOTE — Transfer of Care (Signed)
Immediate Anesthesia Transfer of Care Note  Patient: Glenn [name]  Procedure(s) Performed: CARDIOVERSION (N/A )  Patient Location: ICU  Anesthesia Type:General  Level of Consciousness: awake  Airway & Oxygen Therapy: Patient Spontanous Breathing and Patient connected to nasal cannula oxygen  Post-op Assessment: Report given to RN and Post -op Vital signs reviewed and stable  Post vital signs: Reviewed and stable  Last Vitals:  Vitals Value Taken Time  BP 133/60   Temp    Pulse 80 07/02/19 1324  Resp 12 07/02/19 1324  SpO2 100 % 07/02/19 1324  Vitals shown include unvalidated device data.  Last Pain:  Vitals:   07/02/19 0800  TempSrc:   PainSc: 0-No pain      Patients Stated Pain Goal: 0 (39/68/86 4847)  Complications: No apparent anesthesia complications

## 2019-07-02 NOTE — Progress Notes (Signed)
[facility] continues to follow on Glenn [name]'s progress while in the ICU. He continues to require ICU level care for treatment of his uncontrolled afib and cardiogenic shock. Second dc-cardioversion was attempted today. We will resume care when CCM assesses Glenn [name] as stable and is ready to transfer care back to [facility].   Please page [phone]   Addison Naegeli, M.D.  Family Medicine  PGY-2 07/02/2019 3:52 PM

## 2019-07-03 ENCOUNTER — Encounter: Payer: Self-pay | Admitting: *Deleted

## 2019-07-03 LAB — CBC
HCT: 36.4 % — ABNORMAL LOW (ref 39.0–52.0)
Hemoglobin: 11.9 g/dL — ABNORMAL LOW (ref 13.0–17.0)
MCH: 29.2 pg (ref 26.0–34.0)
MCHC: 32.7 g/dL (ref 30.0–36.0)
MCV: 89.2 fL (ref 80.0–100.0)
Platelets: 151 10*3/uL (ref 150–400)
RBC: 4.08 MIL/uL — ABNORMAL LOW (ref 4.22–5.81)
RDW: 13.3 % (ref 11.5–15.5)
WBC: 5.4 10*3/uL (ref 4.0–10.5)
nRBC: 0 % (ref 0.0–0.2)

## 2019-07-03 LAB — COOXEMETRY PANEL
Carboxyhemoglobin: 2.5 % — ABNORMAL HIGH (ref 0.5–1.5)
Carboxyhemoglobin: 1.8 % — ABNORMAL HIGH (ref 0.5–1.5)
Methemoglobin: 0.7 % (ref 0.0–1.5)
Methemoglobin: 0.4 % (ref 0.0–1.5)
O2 Saturation: 83.5 %
O2 Saturation: 60 %
Total hemoglobin: 12.5 g/dL (ref 12.0–16.0)
Total hemoglobin: 13.3 g/dL (ref 12.0–16.0)

## 2019-07-03 LAB — BASIC METABOLIC PANEL WITH GFR
Anion gap: 13 (ref 5–15)
BUN: 17 mg/dL (ref 8–23)
CO2: 31 mmol/L (ref 22–32)
Calcium: 9 mg/dL (ref 8.9–10.3)
Chloride: 88 mmol/L — ABNORMAL LOW (ref 98–111)
Creatinine, Ser: 2.07 mg/dL — ABNORMAL HIGH (ref 0.61–1.24)
GFR calc Af Amer: 37 mL/min — ABNORMAL LOW
GFR calc non Af Amer: 32 mL/min — ABNORMAL LOW
Glucose, Bld: 113 mg/dL — ABNORMAL HIGH (ref 70–99)
Potassium: 3.6 mmol/L (ref 3.5–5.1)
Sodium: 132 mmol/L — ABNORMAL LOW (ref 135–145)

## 2019-07-03 LAB — POCT ACTIVATED CLOTTING TIME: Activated Clotting Time: 153 seconds

## 2019-07-03 LAB — HEPATIC FUNCTION PANEL
ALT: 13 U/L (ref 0–44)
AST: 16 U/L (ref 15–41)
Albumin: 3.4 g/dL — ABNORMAL LOW (ref 3.5–5.0)
Alkaline Phosphatase: 65 U/L (ref 38–126)
Bilirubin, Direct: 0.2 mg/dL (ref 0.0–0.2)
Indirect Bilirubin: 0.8 mg/dL (ref 0.3–0.9)
Total Bilirubin: 1 mg/dL (ref 0.3–1.2)
Total Protein: 7 g/dL (ref 6.5–8.1)

## 2019-07-03 LAB — MAGNESIUM: Magnesium: 2 mg/dL (ref 1.7–2.4)

## 2019-07-03 LAB — HEPARIN LEVEL (UNFRACTIONATED): Heparin Unfractionated: 0.45 IU/mL (ref 0.30–0.70)

## 2019-07-03 MED ORDER — POTASSIUM CHLORIDE 10 MEQ/50ML IV SOLN
10.0000 meq | INTRAVENOUS | Status: AC
  Administered 2019-07-03 (×4): 10 meq via INTRAVENOUS
  Filled 2019-07-03 (×4): qty 50

## 2019-07-03 MED ORDER — POLYETHYLENE GLYCOL 3350 17 G PO PACK
17.0000 g | PACK | Freq: Every day | ORAL | Status: DC
  Administered 2019-07-03 – 2019-07-13 (×5): 17 g via ORAL
  Filled 2019-07-03 (×10): qty 1

## 2019-07-03 MED ORDER — METOCLOPRAMIDE HCL 10 MG PO TABS
5.0000 mg | ORAL_TABLET | Freq: Three times a day (TID) | ORAL | Status: DC
  Administered 2019-07-03 – 2019-07-16 (×38): 5 mg via ORAL
  Filled 2019-07-03 (×38): qty 1

## 2019-07-03 NOTE — Plan of Care (Signed)
  Problem: Education: Goal: Knowledge of General Education information will improve Description: Including pain rating scale, medication(s)/side effects and non-pharmacologic comfort measures Outcome: Progressing   Problem: Health Behavior/Discharge Planning: Goal: Ability to manage health-related needs will improve Outcome: Progressing   Problem: Clinical Measurements: Goal: Ability to maintain clinical measurements within normal limits will improve Outcome: Progressing Goal: Will remain free from infection Outcome: Progressing Goal: Diagnostic test results will improve Outcome: Progressing Goal: Respiratory complications will improve Outcome: Progressing Goal: Cardiovascular complication will be avoided Outcome: Progressing   Problem: Activity: Goal: Risk for activity intolerance will decrease Outcome: Progressing   Problem: Nutrition: Goal: Adequate nutrition will be maintained Outcome: Progressing   Problem: Coping: Goal: Level of anxiety will decrease Outcome: Progressing   Problem: Elimination: Goal: Will not experience complications related to bowel motility Outcome: Progressing Goal: Will not experience complications related to urinary retention Outcome: Progressing   Problem: Pain Managment: Goal: General experience of comfort will improve Outcome: Progressing   Problem: Safety: Goal: Ability to remain free from injury will improve Outcome: Progressing   Problem: Skin Integrity: Goal: Risk for impaired skin integrity will decrease Outcome: Progressing   Problem: Cardiovascular: Goal: Ability to achieve and maintain adequate cardiovascular perfusion will improve Outcome: Progressing Goal: Vascular access site(s) Level 0-1 will be maintained Outcome: Progressing   Problem: Cardiac: Goal: Ability to achieve and maintain adequate cardiopulmonary perfusion will improve Outcome: Progressing Goal: Vascular access site(s) Level 0-1 will be  maintained Outcome: Progressing   Problem: Fluid Volume: Goal: Ability to achieve a balanced intake and output will improve Outcome: Progressing   Problem: Physical Regulation: Goal: Complications related to the disease process, condition or treatment will be avoided or minimized Outcome: Progressing   Problem: Respiratory: Goal: Will regain and/or maintain adequate ventilation Outcome: Progressing

## 2019-07-03 NOTE — Progress Notes (Addendum)
Advanced Heart Failure Rounding Note   Subjective:    IABP placed on 5/6. Remains on 1:1 with good augmentation. Bleeding some from femoral access site. Hgb 13>>11.9.   Milrinone increased yesterday to 0.25 for low co-ox. Improved from 47%>>68% yesterday.   06/30/19  TEE/DC-CV with conversion to NSR but went back in A fib.  07/02/19 Had repeat DCCV. Remains in NSR. HR 80s.   Co-ox 84% today. CVP 2.  Complaining of nausea/ vomiting undigested food. No BM in 8 days   Scr trending down 2.38>>2.25>>2.07.    Objective:   Weight Range:  Vital Signs:   Temp:  [97.8 F (36.6 C)-98.1 F (36.7 C)] 97.8 F (36.6 C) (05/14 0323) Pulse Rate:  [46-183] 75 (05/14 0115) Resp:  [11-27] 18 (05/14 0115) BP: (119-139)/(55-66) (P) 147/66 (05/14 0600) SpO2:  [76 %-100 %] 95 % (05/14 0115) Weight:  [71.8 kg] 71.8 kg (05/14 0500) Last BM Date: 06/25/19  Weight change: Filed Weights   07/01/19 0400 07/02/19 0500 07/03/19 0500  Weight: 72.4 kg 74 kg 71.8 kg    Intake/Output:   Intake/Output Summary (Last 24 hours) at 07/03/2019 0826 Last data filed at 07/03/2019 0400 Gross per 24 hour  Intake 1848.28 ml  Output 775 ml  Net 1073.28 ml     Physical Exam: CVP 2 General:  Fatigue/ weak appearing male.  No resp difficulty HEENT: normal Neck: supple. no JVD. Carotids 2+ bilat; no bruits. No lymphadenopathy or thryomegaly appreciated. + RIJ CVC Cor: PMI nondisplaced. Regular rhythm and regular rate. No rubs, gallops or murmurs. + mechanical sounds (IABP) Lungs: clear, no wheezing  Abdomen: soft, nontender, nondistended. No hepatosplenomegaly. No bruits or masses. Good bowel sounds. + Biliary drain Extremities: no cyanosis, clubbing, rash, edema. RT groin IABP w/ slight bleeding (gauze bloody).  GU: + Foley  Neuro: alert & orientedx3, cranial nerves grossly intact. moves all 4 extremities w/o difficulty. Affect pleasant   Telemetry:  NSR 80s  Labs: Basic Metabolic Panel: Recent Labs   Lab 06/29/19 0500 06/29/19 0500 06/29/19 1011 06/30/19 0400 06/30/19 0400 07/01/19 0350 07/02/19 0337 07/02/19 1929 07/02/19 1939 07/03/19 0409  NA 133*   < >  --  131*   < > 131* 131* 132* 131* 132*  K 4.5   < >  --  4.9   < > 4.1 4.1 3.3* 3.3* 3.6  CL 95*  --   --  92*  --  88* 88*  --   --  88*  CO2 27  --   --  30  --  28 31  --   --  31  GLUCOSE 122*  --   --  135*  --  138* 122*  --   --  113*  BUN 26*  --   --  25*  --  25* 20  --   --  17  CREATININE 2.23*  --   --  2.44*  --  2.38* 2.25*  --   --  2.07*  CALCIUM 8.9   < >  --  9.0   < > 8.9 9.3  --   --  9.0  MG  --   --  1.5*  --   --   --  1.8  --   --  2.0   < > = values in this interval not displayed.    Liver Function Tests: Recent Labs  Lab 06/26/19 1759  AST 42*  ALT 39  ALKPHOS 70  BILITOT  1.2  PROT 6.7  ALBUMIN 3.3*   No results for input(s): LIPASE, AMYLASE in the last 168 hours. No results for input(s): AMMONIA in the last 168 hours.  CBC: Recent Labs  Lab 06/29/19 0500 06/29/19 0500 06/30/19 0400 06/30/19 0400 07/01/19 0350 07/02/19 0337 07/02/19 1929 07/02/19 1939 07/03/19 0409  WBC 6.8  --  7.7  --  5.0 6.4  --   --  5.4  HGB 11.2*   < > 11.9*   < > 11.4* 12.8* 13.3 13.3 11.9*  HCT 33.6*   < > 36.2*   < > 35.0* 38.9* 39.0 39.0 36.4*  MCV 91.6  --  90.0  --  91.6 89.8  --   --  89.2  PLT 143*  --  161  --  147* 159  --   --  151   < > = values in this interval not displayed.    Cardiac Enzymes: No results for input(s): CKTOTAL, CKMB, CKMBINDEX, TROPONINI in the last 168 hours.  BNP: BNP (last 3 results) Recent Labs    02/03/19 2007 06/01/19 1413 06/23/19 1527  BNP 922.2* 531.4* 613.2*    ProBNP (last 3 results) No results for input(s): PROBNP in the last 8760 hours.    Other results:  Imaging: No results found.   Medications:     Scheduled Medications: . atorvastatin  20 mg Oral Daily  . Chlorhexidine Gluconate Cloth  6 each Topical Daily  . docusate sodium   100 mg Oral BID  . pantoprazole  40 mg Oral Daily  . sodium chloride flush  3 mL Intravenous Q12H    Infusions: . sodium chloride 10 mL/hr at 07/03/19 0448  . amiodarone 60 mg/hr (07/03/19 0457)  . heparin 1,100 Units/hr (07/03/19 0400)  . milrinone 0.25 mcg/kg/min (07/03/19 0400)  . norepinephrine (LEVOPHED) Adult infusion Stopped (07/01/19 0656)    PRN Medications: acetaminophen **OR** acetaminophen, calcium carbonate, lidocaine (PF), ondansetron **OR** ondansetron (ZOFRAN) IV, sodium chloride flush, traMADol, traZODone   Assessment/Plan:   1. Acute on Chronic Combined Systolic and Diastolic Heart Failure -> cardiogenic shock - 2/2 NICM. HF dates back to 2015.  - Most recent echo 4/21 w/ EF <20. RV mildly reduced. GIIIDD. Moderate TR. No LVH.  - [facility] 10/2018 showed normal cors, elevated filling pressures and low CI at 1.9  - Now admitted w/ NYHA IV symptoms, volume overload and worsening renal function, likely cardiorenal syndrome. SCr peaked to 2.97 (prior baseline 1.2).  - RHC 5/5 showed Low output biventricular failure and marked volume overload. PCWP 27. CI 2.0. - IABP placed 5/7 on 1:1 with good augmentation - co-ox improved w/ titration of milrinone + conversion back to NSR - Co-ox up from 47% yesterday to 84% today.  - Continue milrinone 0.25 today. Try to wean IABP  - CVP 2. Continue to hold diuretics   2. AKI - due to shock/cardiorenal - Creatinine peaked at 3 - Todays creatinine 2.07  3. Persistent Atrial Fibrillation w/ RVR - Recent DCCV attempt aborted 4/20 due to LAA thrombi on TEE - S/P DC-CV 06/30/19 with brief conversion to NSR but back in A fib - S/P repeat DCCV 5/13. Remains in NSR. HR 80s - use of digoxin limited due to AKI w/ SCr >2 - Continue amio drip 60 mg per hour.   - will avoid?blocker currently given low output and acute decompensation - EP following for potential AV node ablation but ideally would be able to upgrade to CRT first  4.  NSVT - in the setting of severe LV dysfunction and milrinone  - continue IV amiodarone  - he has ICD - keep K >4.0 and Mg >2.0  - K 3.6. will supplement   5. Chronic Cholecystitis  - has perc biliary drain, stable. AF.  - WBC stable.  - management per primary team/ surgery   6. Hypokalemia - K 3.6 - will give supp KCl. Discussed dosing w/ pharmacy   7. Persistent Nausea and Vomiting/Constipation  - no BM in 8 days  - Check LFTs - unable to tolerate zofran due to HAs - do trial of Reglan + Miralax     Length of Stay: 10    Brittainy Ladoris Gene 07/03/2019, 8:26 AM  Advanced Heart Failure Team Pager [phone] (M-F; 7a - 4p)  Please contact [facility] for night-coverage after hours (4p -7a ) and weekends on [email or web address]  Agree with above.   Remains on milrinone and IABP 1:1. Remains in NSR after repeat DC-CV yesterday. On IV amio, ranexa and heparin.  Remains nauseated despite adequate co-ox. Biliary drain still in place. Renal function improved.   General:  In bed nauseated  No resp difficulty HEENT: normal Neck: supple. no JVD. Carotids 2+ bilat; no bruits. No lymphadenopathy or thryomegaly appreciated. Cor: PMI nondisplaced. Regular rate & rhythm. No rubs, gallops or murmurs. Lungs: clear Abdomen: soft, nontender, nondistended. No hepatosplenomegaly. No bruits or masses. Good bowel sounds. + biliary drain.  Extremities: no cyanosis, clubbing, rash, edema RFA IABP  Neuro: alert & orientedx3, cranial nerves grossly intact. moves all 4 extremities w/o difficulty. Affect pleasant   D/w EPat length this am. Doubt nausea is cardiac related. Options very limited as he is likely not VAD candidate due to RV failure, renal dysfunction and compliance issues. If he remains in NSR throughout the am will consider IABP wean.   CRITICAL CARE Performed by: Glori Bickers  Total critical care time: 35 minutes  Critical care time was exclusive of separately billable  procedures and treating other patients.  Critical care was necessary to treat or prevent imminent or life-threatening deterioration.  Critical care was time spent personally by me (independent of midlevel providers or residents) on the following activities: development of treatment plan with patient and/or surrogate as well as nursing, discussions with consultants, evaluation of patient's response to treatment, examination of patient, obtaining history from patient or surrogate, ordering and performing treatments and interventions, ordering and review of laboratory studies, ordering and review of radiographic studies, pulse oximetry and re-evaluation of patient's condition.  Glori Bickers, MD  9:54 AM

## 2019-07-03 NOTE — Progress Notes (Signed)
Per Dr. Haroldine Laws, pause heparin and obtain ACT 40 min after. Call cath lab to have them come to bedside to remove IABP.  ACT 153. Cath lab called.   RN will continue to monitor patient closely.

## 2019-07-03 NOTE — Progress Notes (Signed)
Site area: Balloon pump/sheath  Rt groin Site Prior to Removal:  Level 0 Pressure Applied For: 35 min Manual:   yes Patient Status During Pull:  A/O Post Pull Site:  Level O Post Pull Instructions Given:  Post instructions given and pt understands Post Pull Pulses Present: 2+ Rt Dp Dressing Applied:  Tegaderm and a 4x4 Bedrest begins @ 14:35:00 Comments: Kaitlyn-RN in to assess Rt groin. No bruising or hematoma. Level 0. Pt was left in stable condition. Call bell, remote and phone within reach.

## 2019-07-03 NOTE — Progress Notes (Addendum)
[place] for Heparin Indication: atrial fibrillation, IABP  Allergies  Allergen Reactions  . Benadryl [Diphenhydramine] Palpitations    Patient Measurements: Height: 5\' 7"  (170.2 cm) Weight: 71.8 kg (158 lb 4.6 oz) IBW/kg (Calculated) : 66.1 Heparin Dosing Weight: 76.2 kg  Vital Signs: Temp: 97.8 F (36.6 C) (05/14 0323) Temp Source: Oral (05/14 0323) BP: (P) 147/66 (05/14 0600) Pulse Rate: 75 (05/14 0115)  Labs: Recent Labs    07/01/19 0350 07/01/19 0350 07/02/19 0337 07/02/19 0337 07/02/19 1241 07/02/19 1600 07/02/19 1929 07/02/19 1929 07/02/19 1939 07/03/19 0409  HGB 11.4*   < > 12.8*   < >  --   --  13.3   < > 13.3 11.9*  HCT 35.0*   < > 38.9*   < >  --   --  39.0  --  39.0 36.4*  PLT 147*  --  159  --   --   --   --   --   --  151  LABPROT  --   --   --   --  14.5  --   --   --   --   --   INR  --   --   --   --  1.2  --   --   --   --   --   HEPARINUNFRC 0.58   < > 0.69  --   --  0.29*  --   --   --  0.45  CREATININE 2.38*  --  2.25*  --   --   --   --   --   --  2.07*   < > = values in this interval not displayed.    Estimated Creatinine Clearance: 32.4 mL/min (A) (by C-G formula based on SCr of 2.07 mg/dL (H)).   Medical History: Past Medical History:  Diagnosis Date  . Acute blood loss anemia   . Acute CVA (cerebrovascular accident) ([place])   . Acute deep vein thrombosis (DVT) of both lower extremities ([name])   . Acute kidney injury ([place])   . Acute on chronic combined systolic and diastolic CHF (congestive heart failure) ([place])   . Acute renal failure superimposed on stage 3a chronic kidney disease ([employer])   . Acute respiratory failure with hypoxia ([place])   . Atrial fibrillation ([place])   . Carpal tunnel syndrome of right wrist 02/28/2018  . Cerebral edema ([place]) 11/13/2018  . Cerebral infarction ([place])   . CHF (congestive heart failure) ([place])   . Cholecystitis 02/04/2019  . Chronic right hip pain   . DCM (dilated  cardiomyopathy) ([place])   . Dysphagia, post-stroke   . Elevated troponin   . Entrapment of right ulnar nerve 02/28/2018  . Headache due to intracranial disease 11/14/2018  . Hepatitis C   . History of hemorrhagic stroke with residual hemiparesis ([place]) 02/04/2019  . HTN (hypertension) 08/14/2016  . Hyperlipidemia LDL goal <70 11/13/2018  . Hypertension   . ICD (implantable cardioverter-defibrillator) in place 09/13/2016  . Impotence due to erectile dysfunction 09/30/2017  . Ischemic cardiomyopathy   . Labile blood glucose   . Left leg DVT ([place]) 02/04/2019  . Marijuana user 11/13/2018  . Paroxysmal atrial fibrillation ([name])   . Right middle cerebral artery stroke ([place]) 11/13/2018  . Solitary pulmonary nodule 06/10/2017   5 mm RUL nodule noted incidentally as part of CVA workup 08/2016. With smoking history would obtain low-dose CT scan 08/2017.   . Stroke (  cerebrum) ([place]) Lg L MCA infarct w/ hemorrhagic conversion, embolic d/t AF 04/01/1550  . Stroke ([place])   . Trochanteric bursitis, right hip 11/14/2018  . Visit for monitoring Tikosyn therapy 03/26/2017    Assessment: Pt is a 69 yo M with atrial fibrillation on dabigatran PTA. Pt reports last dose was the morning of 06/23/19.   Heparin for IABP placement 5/7 and AF. Heparin level is therapeutic at 0.45 on 1100 units/hr. Heparin is infusing through right midline and levels are being drawn from right IJ. Hemoglobin is down slightly this morning at 11.9, platelet count is WNL and stable at 151. No issues with the infusion, some bleeding noted from IV site due to strain from vomiting. Will decrease slightly since patient is at upper end of goal.   Notably, patient underwent cardioversion again yesterday afternoon (5/13). Prior to this, patient underwent cardioversion on 5/11, but subsequently went back into AF.   Goal of Therapy:  Heparin level 0.3-0.5 Monitor platelets by anticoagulation protocol: Yes   Plan:  - Decrease heparin IV infusion at 1050  units/hr - Recheck heparin level in ~8 hr - Monitor daily HL, CBC - Monitor for s/sx of bleeding  ADDENDUM:  IABP pulled, heparin gtt held - now resumed at 1050 units/hr. Will continue this and check HL with morning labs.   Agnes Lawrence, PharmD PGY1 Pharmacy Resident

## 2019-07-03 NOTE — Progress Notes (Signed)
Electrophysiology Rounding Note  Patient Name: Glenn [name] Date of Encounter: 07/03/2019  Primary Cardiologist: Virl Axe, MD AHF: Dr. Haroldine Laws    Subjective   Cardioverted yesterday, and holding sinus Coox 60=-65 Nauseated and vomiting ( again) Denies sob  IABP and milrinone and amio       Inpatient Medications    Scheduled Meds: . atorvastatin  20 mg Oral Daily  . Chlorhexidine Gluconate Cloth  6 each Topical Daily  . docusate sodium  100 mg Oral BID  . metoCLOPramide  5 mg Oral TID  . pantoprazole  40 mg Oral Daily  . polyethylene glycol  17 g Oral Daily  . sodium chloride flush  3 mL Intravenous Q12H   Continuous Infusions: . sodium chloride 10 mL/hr at 07/03/19 0448  . amiodarone 60 mg/hr (07/03/19 0457)  . heparin 1,100 Units/hr (07/03/19 0400)  . milrinone 0.25 mcg/kg/min (07/03/19 0400)  . norepinephrine (LEVOPHED) Adult infusion Stopped (07/01/19 0656)  . potassium chloride     PRN Meds: acetaminophen **OR** acetaminophen, calcium carbonate, lidocaine (PF), ondansetron **OR** ondansetron (ZOFRAN) IV, sodium chloride flush, traMADol, traZODone   Vital Signs    Vitals:   07/03/19 0400 07/03/19 0500 07/03/19 0600 07/03/19 0700  BP: 139/66 139/64 (!) 147/66   Pulse:      Resp:      Temp:    97.8 F (36.6 C)  TempSrc:      SpO2:      Weight:  71.8 kg    Height:        Intake/Output Summary (Last 24 hours) at 07/03/2019 0938 Last data filed at 07/03/2019 0400 Gross per 24 hour  Intake 1848.28 ml  Output 775 ml  Net 1073.28 ml   Filed Weights   07/01/19 0400 07/02/19 0500 07/03/19 0500  Weight: 72.4 kg 74 kg 71.8 kg    Physical Exam   Well developed and nourished HENT normal Neck supple   Clear lat Regular rate and rhythm, IABP audible Abd-soft with active BS No Clubbing cyanosis edema Skin-warm and dry A & Oriented  Grossly normal sensory and motor function    Telemetry Personally reviewed sinus     Labs     CBC Recent Labs    07/02/19 0337 07/02/19 1929 07/02/19 1939 07/03/19 0409  WBC 6.4  --   --  5.4  HGB 12.8*   < > 13.3 11.9*  HCT 38.9*   < > 39.0 36.4*  MCV 89.8  --   --  89.2  PLT 159  --   --  151   < > = values in this interval not displayed.   Basic Metabolic Panel Recent Labs    07/02/19 0337 07/02/19 1929 07/02/19 1939 07/03/19 0409  NA 131*   < > 131* 132*  K 4.1   < > 3.3* 3.6  CL 88*  --   --  88*  CO2 31  --   --  31  GLUCOSE 122*  --   --  113*  BUN 20  --   --  17  CREATININE 2.25*  --   --  2.07*  CALCIUM 9.3  --   --  9.0  MG 1.8  --   --  2.0   < > = values in this interval not displayed.   Liver Function Tests No results for input(s): AST, ALT, ALKPHOS, BILITOT, PROT, ALBUMIN in the last 72 hours. No results for input(s): LIPASE, AMYLASE in the last 72 hours. Cardiac  Enzymes No results for input(s): CKTOTAL, CKMB, CKMBINDEX, TROPONINI in the last 72 hours.   Telemetry    NSR until ~ 0300 this am. Now in AF with rates in 80-100s (personally reviewed)  Radiology    No results found.  Patient Profile     Glenn Magallanes Dejournetteis a 69 y.o.malewith a past medical history significant for medical non-compliance, chronic systolic CHF, CVAs with hemorrhagic conversion, AF with RVR.hewas admitted for Acute on chronic systolic CHF with cardiogenic shock in the setting of AF with RVR.    Assessment & Plan    AF with RVR  NICM    Acute on chronic systolic CHF, cardiogenic shock on IABP and Milrinone   Kidney injury    ICD -[employer]  Nausea    Currently sinus  @ 75 with IABP  Discussed with DB  At this juncture, rhythm and QRSd ideal--prob a reasonable time to consider IABP removal-- not great options however on the other side  If he can not survive in sinus with narrow QRS, upgrade and AV ablation offer only harm.  They would have value in recurrent afib, but for now, will remain hopeful that he can maintain sinus ever for a few  days to get some idea of his ability to perfuse adequately  Nausea been ongoing, do not think it is related to amio,  Would convert to po but has to be able to take PO first, and now struggling with nausea

## 2019-07-03 NOTE — Progress Notes (Signed)
Reviewed with Dr [name]. Pt remains in SR since repeat DCCV yesterday With narrow QRS and relatively controlled V rates in AF, CRT upgrade and AVN ablation likely not best course. Hopefully he will maintain SR Will follow from a distance  [employer] [facility], NP 07/03/2019 9:35 AM

## 2019-07-04 LAB — CBC
HCT: 37.4 % — ABNORMAL LOW (ref 39.0–52.0)
Hemoglobin: 12.2 g/dL — ABNORMAL LOW (ref 13.0–17.0)
MCH: 29 pg (ref 26.0–34.0)
MCHC: 32.6 g/dL (ref 30.0–36.0)
MCV: 89 fL (ref 80.0–100.0)
Platelets: 184 10*3/uL (ref 150–400)
RBC: 4.2 MIL/uL — ABNORMAL LOW (ref 4.22–5.81)
RDW: 13.4 % (ref 11.5–15.5)
WBC: 5.9 10*3/uL (ref 4.0–10.5)
nRBC: 0 % (ref 0.0–0.2)

## 2019-07-04 LAB — BASIC METABOLIC PANEL
Anion gap: 8 (ref 5–15)
BUN: 14 mg/dL (ref 8–23)
CO2: 31 mmol/L (ref 22–32)
Calcium: 9.1 mg/dL (ref 8.9–10.3)
Chloride: 92 mmol/L — ABNORMAL LOW (ref 98–111)
Creatinine, Ser: 1.94 mg/dL — ABNORMAL HIGH (ref 0.61–1.24)
GFR calc Af Amer: 40 mL/min — ABNORMAL LOW (ref >60)
GFR calc non Af Amer: 35 mL/min — ABNORMAL LOW (ref >60)
Glucose, Bld: 123 mg/dL — ABNORMAL HIGH (ref 70–99)
Potassium: 3.8 mmol/L (ref 3.5–5.1)
Sodium: 131 mmol/L — ABNORMAL LOW (ref 135–145)

## 2019-07-04 LAB — COOXEMETRY PANEL
Carboxyhemoglobin: 1.7 % — ABNORMAL HIGH (ref 0.5–1.5)
Carboxyhemoglobin: 2.1 % — ABNORMAL HIGH (ref 0.5–1.5)
Methemoglobin: 0.4 % (ref 0.0–1.5)
Methemoglobin: 0.9 % (ref 0.0–1.5)
O2 Saturation: 56.7 %
O2 Saturation: 62.9 %
Total hemoglobin: 12.3 g/dL (ref 12.0–16.0)
Total hemoglobin: 13.1 g/dL (ref 12.0–16.0)

## 2019-07-04 LAB — HEPARIN LEVEL (UNFRACTIONATED): Heparin Unfractionated: 0.5 IU/mL (ref 0.30–0.70)

## 2019-07-04 MED ORDER — POTASSIUM CHLORIDE CRYS ER 20 MEQ PO TBCR
40.0000 meq | EXTENDED_RELEASE_TABLET | Freq: Once | ORAL | Status: AC
  Administered 2019-07-04: 40 meq via ORAL
  Filled 2019-07-04: qty 2

## 2019-07-04 MED ORDER — SORBITOL 70 % SOLN
30.0000 mL | Freq: Every day | Status: DC | PRN
  Administered 2019-07-04: 30 mL via ORAL
  Filled 2019-07-04: qty 30

## 2019-07-04 MED ORDER — RANOLAZINE ER 500 MG PO TB12
500.0000 mg | ORAL_TABLET | Freq: Two times a day (BID) | ORAL | Status: DC
  Administered 2019-07-04 – 2019-07-16 (×25): 500 mg via ORAL
  Filled 2019-07-04 (×25): qty 1

## 2019-07-04 MED ORDER — BOOST / RESOURCE BREEZE PO LIQD CUSTOM
1.0000 | Freq: Three times a day (TID) | ORAL | Status: DC
  Administered 2019-07-04 – 2019-07-16 (×30): 1 via ORAL

## 2019-07-04 MED ORDER — DIGOXIN 125 MCG PO TABS
0.1250 mg | ORAL_TABLET | Freq: Every day | ORAL | Status: DC
  Administered 2019-07-04 – 2019-07-16 (×13): 0.125 mg via ORAL
  Filled 2019-07-04 (×13): qty 1

## 2019-07-04 NOTE — Progress Notes (Addendum)
Patient ID: Glenn [name], male   DOB: 1950-11-04, 69 y.o.   MRN: [id number]    Advanced Heart Failure Rounding Note   Subjective:    06/25/19 IABP placed 06/30/19  TEE/DC-CV with conversion to NSR but went back in A fib.  07/02/19 Had repeat DCCV. Remains in NSR. HR 80s.  07/03/19 IABP removed  He went back into atrial fibrillation last night, rate is controlled.  Remains on heparin gtt and amiodarone 60 mg/hr.  Co-ox 58%, CVP 3.   Still with nausea.  No BM.   Scr trending down 2.38>>2.25>>2.07>>1.94.    Objective:   Weight Range:  Vital Signs:   Temp:  [97.8 F (36.6 C)-98.4 F (36.9 C)] 98.4 F (36.9 C) (05/15 0750) Pulse Rate:  [66-103] 91 (05/15 0700) Resp:  [10-24] 12 (05/15 0700) BP: (109-143)/(48-112) 139/74 (05/15 0600) SpO2:  [92 %-100 %] 96 % (05/15 0700) Weight:  [72.1 kg] 72.1 kg (05/15 0500) Last BM Date: 06/25/19  Weight change: Filed Weights   07/02/19 0500 07/03/19 0500 07/04/19 0500  Weight: 74 kg 71.8 kg 72.1 kg    Intake/Output:   Intake/Output Summary (Last 24 hours) at 07/04/2019 0813 Last data filed at 07/04/2019 0600 Gross per 24 hour  Intake 1393.83 ml  Output 1075 ml  Net 318.83 ml     Physical Exam: CVP 3 General: NAD Neck: No JVD, no thyromegaly or thyroid nodule.  Lungs: Clear to auscultation bilaterally with normal respiratory effort. CV: Nondisplaced PMI.  Heart irregular S1/S2, no S3/S4, no murmur.  No peripheral edema.   Abdomen: Soft, nontender, no hepatosplenomegaly, no distention.  Skin: Intact without lesions or rashes.  Neurologic: Alert and oriented x 3.  Psych: Normal affect. Extremities: No clubbing or cyanosis.  HEENT: Normal.   Telemetry:  Atrial fibrillation rate 90s  Labs: Basic Metabolic Panel: Recent Labs  Lab 06/29/19 0500 06/29/19 1011 06/30/19 0400 06/30/19 0400 07/01/19 0350 07/01/19 0350 07/02/19 0337 07/02/19 1929 07/02/19 1939 07/03/19 0409 07/04/19 0318  NA   < >  --  131*   < > 131*    < > 131* 132* 131* 132* 131*  K   < >  --  4.9   < > 4.1   < > 4.1 3.3* 3.3* 3.6 3.8  CL   < >  --  92*  --  88*  --  88*  --   --  88* 92*  CO2   < >  --  30  --  28  --  31  --   --  31 31  GLUCOSE   < >  --  135*  --  138*  --  122*  --   --  113* 123*  BUN   < >  --  25*  --  25*  --  20  --   --  17 14  CREATININE   < >  --  2.44*  --  2.38*  --  2.25*  --   --  2.07* 1.94*  CALCIUM   < >  --  9.0   < > 8.9   < > 9.3  --   --  9.0 9.1  MG  --  1.5*  --   --   --   --  1.8  --   --  2.0  --    < > = values in this interval not displayed.    Liver Function Tests: Recent Labs  Lab 07/03/19 0409  AST 16  ALT 13  ALKPHOS 65  BILITOT 1.0  PROT 7.0  ALBUMIN 3.4*   No results for input(s): LIPASE, AMYLASE in the last 168 hours. No results for input(s): AMMONIA in the last 168 hours.  CBC: Recent Labs  Lab 06/30/19 0400 06/30/19 0400 07/01/19 0350 07/01/19 0350 07/02/19 0337 07/02/19 1929 07/02/19 1939 07/03/19 0409 07/04/19 0318  WBC 7.7  --  5.0  --  6.4  --   --  5.4 5.9  HGB 11.9*   < > 11.4*   < > 12.8* 13.3 13.3 11.9* 12.2*  HCT 36.2*   < > 35.0*   < > 38.9* 39.0 39.0 36.4* 37.4*  MCV 90.0  --  91.6  --  89.8  --   --  89.2 89.0  PLT 161  --  147*  --  159  --   --  151 184   < > = values in this interval not displayed.    Cardiac Enzymes: No results for input(s): CKTOTAL, CKMB, CKMBINDEX, TROPONINI in the last 168 hours.  BNP: BNP (last 3 results) Recent Labs    02/03/19 2007 06/01/19 1413 06/23/19 1527  BNP 922.2* 531.4* 613.2*    ProBNP (last 3 results) No results for input(s): PROBNP in the last 8760 hours.    Other results:  Imaging: No results found.   Medications:     Scheduled Medications: . atorvastatin  20 mg Oral Daily  . Chlorhexidine Gluconate Cloth  6 each Topical Daily  . docusate sodium  100 mg Oral BID  . metoCLOPramide  5 mg Oral TID  . pantoprazole  40 mg Oral Daily  . polyethylene glycol  17 g Oral Daily  . sodium  chloride flush  3 mL Intravenous Q12H    Infusions: . sodium chloride Stopped (07/03/19 1622)  . amiodarone 60 mg/hr (07/04/19 0600)  . heparin 1,050 Units/hr (07/04/19 0600)  . milrinone 0.25 mcg/kg/min (07/04/19 0600)  . norepinephrine (LEVOPHED) Adult infusion Stopped (07/01/19 0656)    PRN Medications: acetaminophen **OR** acetaminophen, calcium carbonate, lidocaine (PF), ondansetron **OR** ondansetron (ZOFRAN) IV, sodium chloride flush, sorbitol, traMADol, traZODone   Assessment/Plan:   1. Acute on Chronic Combined Systolic and Diastolic Heart Failure -> cardiogenic shock - 2/2 NICM. HF dates back to 2015.  - Most recent echo 4/21 w/ EF <20. RV mildly reduced. GIIIDD. Moderate TR. No LVH.  - [facility] 10/2018 showed normal cors, elevated filling pressures and low CI at 1.9  - Now admitted w/ NYHA IV symptoms, volume overload and worsening renal function, likely cardiorenal syndrome. SCr peaked to 2.97 (prior baseline 1.2).  - RHC 5/5 showed Low output biventricular failure and marked volume overload. PCWP 27. CI 2.0. - IABP placed 5/7 on 1:1 with good augmentation, removed 5/14.  - He remains on milrinone gtt but back in atrial fibrillation.  Co-ox 58%.  Will see if we can wean off milrinone and repeat DCCV, decrease milrinone to 0.125 and repeat co-ox in pm.  - Add digoxin with fall in creatinine.  - CVP 3 today, no need for diuretic.  - Poor LVAD candidate with RV dysfunction and renal failure.   2. AKI - due to shock/cardiorenal - Creatinine peaked at 3 - Todays creatinine down to 1.94.   3. Persistent Atrial Fibrillation w/ RVR - Recent DCCV attempt aborted 4/20 due to LAA thrombi on TEE - S/P DC-CV 06/30/19 with brief conversion to NSR but back in A fib - S/P repeat DCCV 5/13. Back  in atrial fibrillation with controlled rate on 5/15.  - Continue amio drip 60 mg per hour.   - will avoid?blocker currently given low output and acute decompensation - Add ranolazine 500 mg  bid.  Follow QT interval with ECG in am.   - will see if we can wean milrinone and cardiovert again on Monday.  - EP following => final option would be AV nodal ablation with BiV pacing, but rate is not significantly elevated and his baseline QRS is narrow.   4. NSVT - in the setting of severe LV dysfunction and milrinone  - continue IV amiodarone  - he has ICD - keep K >4.0 and Mg >2.0  - K 3.8. will supplement   5. Chronic Cholecystitis  - has perc biliary drain, stable. AF.  - WBC stable.  - management per surgery   6. Hypokalemia - K 3.8 - will give supp KCl. Discussed dosing w/ pharmacy   7. Persistent Nausea and Vomiting/Constipation  - no BM in 9 days  - LFTs normal 5/14. - unable to tolerate zofran due to HAs - On Reglan.  - Add sorbitol.    Length of Stay: [place] Performed by: Loralie Champagne  Total critical care time: 35 minutes  Critical care time was exclusive of separately billable procedures and treating other patients.  Critical care was necessary to treat or prevent imminent or life-threatening deterioration.  Critical care was time spent personally by me (independent of midlevel providers or residents) on the following activities: development of treatment plan with patient and/or surrogate as well as nursing, discussions with consultants, evaluation of patient's response to treatment, examination of patient, obtaining history from patient or surrogate, ordering and performing treatments and interventions, ordering and review of laboratory studies, ordering and review of radiographic studies, pulse oximetry and re-evaluation of patient's condition.  Loralie Champagne, MD  8:13 AM

## 2019-07-04 NOTE — Progress Notes (Signed)
[place] for Heparin Indication: atrial fibrillation, IABP  Allergies  Allergen Reactions  . Benadryl [Diphenhydramine] Palpitations    Patient Measurements: Height: 5\' 7"  (170.2 cm) Weight: 72.1 kg (158 lb 15.2 oz) IBW/kg (Calculated) : 66.1 Heparin Dosing Weight: 76.2 kg  Vital Signs: Temp: 97.7 F (36.5 C) (05/15 1141) Temp Source: Oral (05/15 0329) BP: 115/69 (05/15 1300) Pulse Rate: 90 (05/15 1300)  Labs: Recent Labs    07/02/19 0337 07/02/19 0337 07/02/19 1241 07/02/19 1600 07/02/19 1929 07/02/19 1939 07/02/19 1939 07/03/19 0409 07/04/19 0318  HGB 12.8*  --   --   --    < > 13.3   < > 11.9* 12.2*  HCT 38.9*  --   --   --    < > 39.0  --  36.4* 37.4*  PLT 159  --   --   --   --   --   --  151 184  LABPROT  --   --  14.5  --   --   --   --   --   --   INR  --   --  1.2  --   --   --   --   --   --   HEPARINUNFRC 0.69   < >  --  0.29*  --   --   --  0.45 0.50  CREATININE 2.25*  --   --   --   --   --   --  2.07* 1.94*   < > = values in this interval not displayed.    Estimated Creatinine Clearance: 34.5 mL/min (A) (by C-G formula based on SCr of 1.94 mg/dL (H)).   Medical History: Past Medical History:  Diagnosis Date  . Acute blood loss anemia   . Acute CVA (cerebrovascular accident) ([place])   . Acute deep vein thrombosis (DVT) of both lower extremities ([name])   . Acute kidney injury ([place])   . Acute on chronic combined systolic and diastolic CHF (congestive heart failure) ([place])   . Acute renal failure superimposed on stage 3a chronic kidney disease ([place])   . Acute respiratory failure with hypoxia ([place])   . Atrial fibrillation ([place])   . Carpal tunnel syndrome of right wrist 02/28/2018  . Cerebral edema ([place]) 11/13/2018  . Cerebral infarction ([place])   . CHF (congestive heart failure) ([place])   . Cholecystitis 02/04/2019  . Chronic right hip pain   . DCM (dilated cardiomyopathy) ([place])   . Dysphagia, post-stroke   .  Elevated troponin   . Entrapment of right ulnar nerve 02/28/2018  . Headache due to intracranial disease 11/14/2018  . Hepatitis C   . History of hemorrhagic stroke with residual hemiparesis ([place]) 02/04/2019  . HTN (hypertension) 08/14/2016  . Hyperlipidemia LDL goal <70 11/13/2018  . Hypertension   . ICD (implantable cardioverter-defibrillator) in place 09/13/2016  . Impotence due to erectile dysfunction 09/30/2017  . Ischemic cardiomyopathy   . Labile blood glucose   . Left leg DVT ([place]) 02/04/2019  . Marijuana user 11/13/2018  . Paroxysmal atrial fibrillation ([name])   . Right middle cerebral artery stroke ([place]) 11/13/2018  . Solitary pulmonary nodule 06/10/2017   5 mm RUL nodule noted incidentally as part of CVA workup 08/2016. With smoking history would obtain low-dose CT scan 08/2017.   . Stroke (cerebrum) ([name]) Lg L MCA infarct w/ hemorrhagic conversion, embolic d/t AF 1/74/9449  . Stroke ([place])   . Trochanteric bursitis,  right hip 11/14/2018  . Visit for monitoring Tikosyn therapy 03/26/2017    Assessment: Pt is a 69 yo M with atrial fibrillation on dabigatran PTA. Pt reports last dose was the morning of 06/23/19.   Heparin for IABP placement 5/7 and AF. Heparin level is therapeutic at 0.5 on 1100 units/hr. Heparin is infusing through right midline and levels are being drawn from right IJ.   Notably, patient underwent cardioversion again yesterday afternoon (5/13). Prior to this, patient underwent cardioversion on 5/11, but subsequently went back into AF.   Heparin level remains within goal range this AM.  No overt bleeding or complications noted.  Goal of Therapy:  Heparin level 0.3-0.7 (now off IABP) Monitor platelets by anticoagulation protocol: Yes   Plan:  - Continue heparin IV infusion at 1050 units/hr - Monitor daily HL, CBC - Monitor for s/sx of bleeding  [name], [facility] Clinical Pharmacist Phone [phone]  07/04/2019 1:33 PM

## 2019-07-04 NOTE — Progress Notes (Addendum)
Family medicine social note  Saw and briefly spoke with Mr.Buckle today.  Resting comfortably in hospital bed.  No complaints aside from continued nausea and pain from his right IJ.  Patient states that he is in good spirits and is feeling a little better from yesterday.  I appreciate the excellent care he has received from Dr. Haroldine Laws and his team as well as Dr. [name] and the EP team for his very complex cardiac issue.  Family medicine team will continue to follow socially and be available for his care when/if he transitions to the floor.  [name] MD PGY-3 Family Medicine Resident

## 2019-07-05 LAB — BASIC METABOLIC PANEL
Anion gap: 11 (ref 5–15)
BUN: 11 mg/dL (ref 8–23)
CO2: 29 mmol/L (ref 22–32)
Calcium: 9.1 mg/dL (ref 8.9–10.3)
Chloride: 92 mmol/L — ABNORMAL LOW (ref 98–111)
Creatinine, Ser: 1.85 mg/dL — ABNORMAL HIGH (ref 0.61–1.24)
GFR calc Af Amer: 43 mL/min — ABNORMAL LOW (ref >60)
GFR calc non Af Amer: 37 mL/min — ABNORMAL LOW (ref >60)
Glucose, Bld: 135 mg/dL — ABNORMAL HIGH (ref 70–99)
Potassium: 3.8 mmol/L (ref 3.5–5.1)
Sodium: 132 mmol/L — ABNORMAL LOW (ref 135–145)

## 2019-07-05 LAB — CBC
HCT: 35.8 % — ABNORMAL LOW (ref 39.0–52.0)
Hemoglobin: 11.9 g/dL — ABNORMAL LOW (ref 13.0–17.0)
MCH: 30 pg (ref 26.0–34.0)
MCHC: 33.2 g/dL (ref 30.0–36.0)
MCV: 90.2 fL (ref 80.0–100.0)
Platelets: 199 10*3/uL (ref 150–400)
RBC: 3.97 MIL/uL — ABNORMAL LOW (ref 4.22–5.81)
RDW: 13.7 % (ref 11.5–15.5)
WBC: 5.9 10*3/uL (ref 4.0–10.5)
nRBC: 0 % (ref 0.0–0.2)

## 2019-07-05 LAB — COOXEMETRY PANEL
Carboxyhemoglobin: 1.9 % — ABNORMAL HIGH (ref 0.5–1.5)
Methemoglobin: 0.8 % (ref 0.0–1.5)
O2 Saturation: 52.1 %
Total hemoglobin: 12.1 g/dL (ref 12.0–16.0)

## 2019-07-05 LAB — HEPARIN LEVEL (UNFRACTIONATED): Heparin Unfractionated: 0.55 IU/mL (ref 0.30–0.70)

## 2019-07-05 LAB — MAGNESIUM: Magnesium: 1.8 mg/dL (ref 1.7–2.4)

## 2019-07-05 MED ORDER — POTASSIUM CHLORIDE CRYS ER 20 MEQ PO TBCR
20.0000 meq | EXTENDED_RELEASE_TABLET | Freq: Once | ORAL | Status: AC
  Administered 2019-07-05: 20 meq via ORAL
  Filled 2019-07-05: qty 1

## 2019-07-05 MED ORDER — LEVALBUTEROL HCL 0.63 MG/3ML IN NEBU: 0.6300 mg | INHALATION_SOLUTION | Freq: Four times a day (QID) | RESPIRATORY_TRACT | Status: DC | PRN

## 2019-07-05 MED ORDER — POTASSIUM CHLORIDE CRYS ER 20 MEQ PO TBCR
40.0000 meq | EXTENDED_RELEASE_TABLET | Freq: Once | ORAL | Status: AC
  Administered 2019-07-05: 40 meq via ORAL
  Filled 2019-07-05: qty 2

## 2019-07-05 MED ORDER — FUROSEMIDE 10 MG/ML IJ SOLN
80.0000 mg | Freq: Once | INTRAMUSCULAR | Status: AC
  Administered 2019-07-05: 80 mg via INTRAVENOUS
  Filled 2019-07-05: qty 8

## 2019-07-05 NOTE — Progress Notes (Signed)
Pt tolerated ambulation of entire unit (380ft) very well. Remained in sinus rhythm and only complained of burning pain in left big toe.

## 2019-07-05 NOTE — Plan of Care (Signed)
Discussed plan of care/goals of advancing diet to Heart Healthy diet as tolerated.

## 2019-07-05 NOTE — Progress Notes (Signed)
[place] for Heparin Indication: atrial fibrillation, IABP  Allergies  Allergen Reactions  . Benadryl [Diphenhydramine] Palpitations    Patient Measurements: Height: 5\' 7"  (170.2 cm) Weight: 70.9 kg (156 lb 4.9 oz) IBW/kg (Calculated) : 66.1 Heparin Dosing Weight: 76.2 kg  Vital Signs: Temp: 97.5 F (36.4 C) (05/16 0811) Temp Source: Oral (05/16 0811) BP: 108/71 (05/16 0800) Pulse Rate: 86 (05/16 0900)  Labs: Recent Labs    07/02/19 1241 07/02/19 1600 07/03/19 0409 07/03/19 0409 07/04/19 0318 07/05/19 0411  HGB  --    < > 11.9*   < > 12.2* 11.9*  HCT  --    < > 36.4*  --  37.4* 35.8*  PLT  --   --  151  --  184 199  LABPROT 14.5  --   --   --   --   --   INR 1.2  --   --   --   --   --   HEPARINUNFRC  --    < > 0.45  --  0.50 0.55  CREATININE  --   --  2.07*  --  1.94* 1.85*   < > = values in this interval not displayed.    Estimated Creatinine Clearance: 36.2 mL/min (A) (by C-G formula based on SCr of 1.85 mg/dL (H)).   Medical History: Past Medical History:  Diagnosis Date  . Acute blood loss anemia   . Acute CVA (cerebrovascular accident) ([place])   . Acute deep vein thrombosis (DVT) of both lower extremities ([name])   . Acute kidney injury ([place])   . Acute on chronic combined systolic and diastolic CHF (congestive heart failure) ([place])   . Acute renal failure superimposed on stage 3a chronic kidney disease ([place])   . Acute respiratory failure with hypoxia ([place])   . Atrial fibrillation ([place])   . Carpal tunnel syndrome of right wrist 02/28/2018  . Cerebral edema ([place]) 11/13/2018  . Cerebral infarction ([place])   . CHF (congestive heart failure) ([place])   . Cholecystitis 02/04/2019  . Chronic right hip pain   . DCM (dilated cardiomyopathy) ([place])   . Dysphagia, post-stroke   . Elevated troponin   . Entrapment of right ulnar nerve 02/28/2018  . Headache due to intracranial disease 11/14/2018  . Hepatitis C   . History of  hemorrhagic stroke with residual hemiparesis ([place]) 02/04/2019  . HTN (hypertension) 08/14/2016  . Hyperlipidemia LDL goal <70 11/13/2018  . Hypertension   . ICD (implantable cardioverter-defibrillator) in place 09/13/2016  . Impotence due to erectile dysfunction 09/30/2017  . Ischemic cardiomyopathy   . Labile blood glucose   . Left leg DVT ([place]) 02/04/2019  . Marijuana user 11/13/2018  . Paroxysmal atrial fibrillation ([name])   . Right middle cerebral artery stroke ([place]) 11/13/2018  . Solitary pulmonary nodule 06/10/2017   5 mm RUL nodule noted incidentally as part of CVA workup 08/2016. With smoking history would obtain low-dose CT scan 08/2017.   . Stroke (cerebrum) ([name]) Lg L MCA infarct w/ hemorrhagic conversion, embolic d/t AF 2/77/8242  . Stroke ([place])   . Trochanteric bursitis, right hip 11/14/2018  . Visit for monitoring Tikosyn therapy 03/26/2017    Assessment: Pt is a 69 yo M with atrial fibrillation on dabigatran PTA. Pt reports last dose was the morning of 06/23/19.   Heparin for IABP placement 5/7 and AF. Heparin level is therapeutic at 0.55 on 1050 units/hr. Heparin is infusing through right midline and levels are  being drawn from right IJ.   Converted to NSR overnight.  Heparin level remains within goal range this AM.  No overt bleeding or complications noted.  CBC stable.  Goal of Therapy:  Heparin level 0.3-0.7 (now off IABP) Monitor platelets by anticoagulation protocol: Yes   Plan:  - Continue heparin IV infusion at 1050 units/hr - Monitor daily HL, CBC - Monitor for s/sx of bleeding  Glenn [name], [facility] Clinical Pharmacist Phone [phone]  07/05/2019 9:42 AM

## 2019-07-05 NOTE — Progress Notes (Signed)
Patient ID: Glenn [name], male   DOB: [birth year], 69 y.o.   MRN: [id number]    Advanced Heart Failure Rounding Note   Subjective:    06/25/19 IABP placed 06/30/19  TEE/DC-CV with conversion to NSR but went back in A fib.  07/02/19 Had repeat DCCV. Remains in NSR. HR 80s.  07/03/19 IABP removed  He went back into atrial fibrillation on night of 5/14.  Remains on heparin gtt and amiodarone 60 mg/hr. Reconverted to NSR overnight  Co-ox 58% -> 52%, CVP 13 (checked personally)   Had BM. Nausea resolved. Denies CP or SOB.   Scr trending down 2.38>>2.25>>2.07>>1.94>>1.85  Objective:   Weight Range:  Vital Signs:   Temp:  [97.7 F (36.5 C)-98.6 F (37 C)] 98 F (36.7 C) (05/16 0300) Pulse Rate:  [48-138] 72 (05/16 0500) Resp:  [9-27] 13 (05/16 0500) BP: (105-147)/(61-89) 110/61 (05/16 0500) SpO2:  [94 %-100 %] 96 % (05/16 0500) Last BM Date: 07/04/19  Weight change: Filed Weights   07/02/19 0500 07/03/19 0500 07/04/19 0500  Weight: 74 kg 71.8 kg 72.1 kg    Intake/Output:   Intake/Output Summary (Last 24 hours) at 07/05/2019 0542 Last data filed at 07/05/2019 0500 Gross per 24 hour  Intake 1454.91 ml  Output 645 ml  Net 809.91 ml     Physical Exam: CVP 13 General:  Well appearing. No resp difficulty HEENT: normal Neck: supple. JV to jaw  Carotids 2+ bilat; no bruits. No lymphadenopathy or thryomegaly appreciated. Cor: PMI nondisplaced. Regular rate & rhythm. 2/6 TR Lungs: clear Abdomen: soft, nontender, nondistended. No hepatosplenomegaly. No bruits or masses. Good bowel sounds. + biliary drain Extremities: no cyanosis, clubbing, rash, edema Neuro: alert & orientedx3, cranial nerves grossly intact. moves all 4 extremities w/o difficulty. Affect pleasant   Telemetry:  NSR 70-80s Personally reviewed  Labs: Basic Metabolic Panel: Recent Labs  Lab 06/29/19 1011 06/30/19 0400 07/01/19 0350 07/01/19 0350 07/02/19 0337 07/02/19 0337 07/02/19 1929  07/02/19 1939 07/03/19 0409 07/04/19 0318 07/05/19 0411  NA  --    < > 131*   < > 131*   < > 132* 131* 132* 131* 132*  K  --    < > 4.1   < > 4.1   < > 3.3* 3.3* 3.6 3.8 3.8  CL  --    < > 88*  --  88*  --   --   --  88* 92* 92*  CO2  --    < > 28  --  31  --   --   --  31 31 29   GLUCOSE  --    < > 138*  --  122*  --   --   --  113* 123* 135*  BUN  --    < > 25*  --  20  --   --   --  17 14 11   CREATININE  --    < > 2.38*  --  2.25*  --   --   --  2.07* 1.94* 1.85*  CALCIUM  --    < > 8.9   < > 9.3   < >  --   --  9.0 9.1 9.1  MG 1.5*  --   --   --  1.8  --   --   --  2.0  --  1.8   < > = values in this interval not displayed.    Liver Function Tests: Recent Labs  Lab 07/03/19  0409  AST 16  ALT 13  ALKPHOS 65  BILITOT 1.0  PROT 7.0  ALBUMIN 3.4*   No results for input(s): LIPASE, AMYLASE in the last 168 hours. No results for input(s): AMMONIA in the last 168 hours.  CBC: Recent Labs  Lab 07/01/19 0350 07/01/19 0350 07/02/19 0337 07/02/19 0337 07/02/19 1929 07/02/19 1939 07/03/19 0409 07/04/19 0318 07/05/19 0411  WBC 5.0  --  6.4  --   --   --  5.4 5.9 5.9  HGB 11.4*   < > 12.8*   < > 13.3 13.3 11.9* 12.2* 11.9*  HCT 35.0*   < > 38.9*   < > 39.0 39.0 36.4* 37.4* 35.8*  MCV 91.6  --  89.8  --   --   --  89.2 89.0 90.2  PLT 147*  --  159  --   --   --  151 184 199   < > = values in this interval not displayed.    Cardiac Enzymes: No results for input(s): CKTOTAL, CKMB, CKMBINDEX, TROPONINI in the last 168 hours.  BNP: BNP (last 3 results) Recent Labs    02/03/19 2007 06/01/19 1413 06/23/19 1527  BNP 922.2* 531.4* 613.2*    ProBNP (last 3 results) No results for input(s): PROBNP in the last 8760 hours.    Other results:  Imaging: No results found.   Medications:     Scheduled Medications: . atorvastatin  20 mg Oral Daily  . Chlorhexidine Gluconate Cloth  6 each Topical Daily  . digoxin  0.125 mg Oral Daily  . docusate sodium  100 mg Oral  BID  . feeding supplement  1 Container Oral TID BM  . metoCLOPramide  5 mg Oral TID  . pantoprazole  40 mg Oral Daily  . polyethylene glycol  17 g Oral Daily  . ranolazine  500 mg Oral BID  . sodium chloride flush  3 mL Intravenous Q12H    Infusions: . sodium chloride 20 mL/hr at 07/05/19 0500  . amiodarone 60 mg/hr (07/05/19 0500)  . heparin 1,050 Units/hr (07/05/19 0500)  . milrinone 0.125 mcg/kg/min (07/05/19 0500)  . norepinephrine (LEVOPHED) Adult infusion Stopped (07/01/19 0656)    PRN Medications: acetaminophen **OR** acetaminophen, calcium carbonate, lidocaine (PF), ondansetron **OR** ondansetron (ZOFRAN) IV, sodium chloride flush, sorbitol, traMADol, traZODone   Assessment/Plan:   1. Acute on Chronic Combined Systolic and Diastolic Heart Failure -> cardiogenic shock - 2/2 NICM. HF dates back to 2015.  - Most recent echo 4/21 w/ EF <20. RV mildly reduced. GIIIDD. Moderate TR. No LVH.  - [facility] 10/2018 showed normal cors, elevated filling pressures and low CI at 1.9  - Now admitted w/ NYHA IV symptoms, volume overload and worsening renal function, likely cardiorenal syndrome. SCr peaked to 2.97 (prior baseline 1.2).  - RHC 5/5 showed Low output biventricular failure and marked volume overload. PCWP 27. CI 2.0. - IABP placed 5/7 on 1:1 with good augmentation, removed 5/14.  - Remains on milrinone 0.125 - He converted back to NSR overnight. However co-ox down 58%-> 52% - CVP 13. Give lasix 80 IV x 1 - Poor LVAD candidate with RV dysfunction, noncompliance and renal failure.   2. AKI - due to shock/cardiorenal - Creatinine peaked at 3 - Todays creatinine down to 1.85  3. Persistent Atrial Fibrillation w/ RVR - Recent DCCV attempt aborted 4/20 due to LAA thrombi on TEE - S/P DC-CV 06/30/19 with brief conversion to NSR but back in A fib - S/P repeat  DCCV 5/13. Back in atrial fibrillation with controlled rate on 5/15.  - Converted back to NSR overnight on IV amio -  Continue amio drip 60 mg per hour.   - will avoid?blocker currently given low output and acute decompensation - Continue ranolazine 500 mg bid.  Watch QT - Continue heparin. If co-ox stable will switch heparin back to [name] in am  - EP following => final option would be AV nodal ablation with BiV pacing, but rate is not significantly elevated and his baseline QRS is narrow.   4. NSVT - in the setting of severe LV dysfunction and milrinone  - continue IV amiodarone  - he has ICD - keep K >4.0 and Mg >2.0  - K 3.8. will supplement   5. Chronic Cholecystitis  - has perc biliary drain, stable. AF.  - WBC stable at 5.9 - management per surgery   6. Hypokalemia - K 3.8 - will give supp KCl. Discussed dosing w/ pharmacy   7. Persistent Nausea and Vomiting/Constipation  - improved with BM    Length of Stay: 12  Glori Bickers, MD  5:42 AM

## 2019-07-06 LAB — BASIC METABOLIC PANEL
Anion gap: 8 (ref 5–15)
BUN: 14 mg/dL (ref 8–23)
CO2: 28 mmol/L (ref 22–32)
Calcium: 9.1 mg/dL (ref 8.9–10.3)
Chloride: 96 mmol/L — ABNORMAL LOW (ref 98–111)
Creatinine, Ser: 2.04 mg/dL — ABNORMAL HIGH (ref 0.61–1.24)
GFR calc Af Amer: 38 mL/min — ABNORMAL LOW (ref >60)
GFR calc non Af Amer: 33 mL/min — ABNORMAL LOW (ref >60)
Glucose, Bld: 125 mg/dL — ABNORMAL HIGH (ref 70–99)
Potassium: 4.1 mmol/L (ref 3.5–5.1)
Sodium: 132 mmol/L — ABNORMAL LOW (ref 135–145)

## 2019-07-06 LAB — COOXEMETRY PANEL
Carboxyhemoglobin: 1.4 % (ref 0.5–1.5)
Methemoglobin: 0.7 % (ref 0.0–1.5)
O2 Saturation: 45.9 %
Total hemoglobin: 11.5 g/dL — ABNORMAL LOW (ref 12.0–16.0)

## 2019-07-06 LAB — MAGNESIUM: Magnesium: 1.6 mg/dL — ABNORMAL LOW (ref 1.7–2.4)

## 2019-07-06 LAB — HEPARIN LEVEL (UNFRACTIONATED): Heparin Unfractionated: 0.6 IU/mL (ref 0.30–0.70)

## 2019-07-06 MED ORDER — ATROPINE SULFATE 1 MG/10ML IJ SOSY
PREFILLED_SYRINGE | INTRAMUSCULAR | Status: AC
  Filled 2019-07-06: qty 10

## 2019-07-06 MED ORDER — MAGNESIUM SULFATE 4 GM/100ML IV SOLN
4.0000 g | Freq: Once | INTRAVENOUS | Status: AC
  Administered 2019-07-06: 4 g via INTRAVENOUS
  Filled 2019-07-06: qty 100

## 2019-07-06 MED ORDER — HEPARIN (PORCINE) 25000 UT/250ML-% IV SOLN
1000.0000 [IU]/h | INTRAVENOUS | Status: DC
  Administered 2019-07-06: 950 [IU]/h via INTRAVENOUS
  Administered 2019-07-07: 1000 [IU]/h via INTRAVENOUS
  Filled 2019-07-06 (×2): qty 250

## 2019-07-06 NOTE — Progress Notes (Signed)
Electrophysiology Rounding Note  Patient Name: Glenn [name] Date of Encounter: 07/06/2019  Primary Cardiologist: Virl Axe, MD AHF: Dr. Haroldine Laws    Subjective   IAABP out   Back in AFib with HR well  Controlled  Coox down >> 46% on milrinone; Cr up a touch   CVP low ?  Volume depleted it was you find out culture no edema CrCl PCP is bradycardia resolved to see have CHF-note  Bled from IABP site this am  Describes anxiety---forgetful  At home   Lives with his son and this is a challenge     Inpatient Medications    Scheduled Meds: . atorvastatin  20 mg Oral Daily  . Chlorhexidine Gluconate Cloth  6 each Topical Daily  . digoxin  0.125 mg Oral Daily  . docusate sodium  100 mg Oral BID  . feeding supplement  1 Container Oral TID BM  . metoCLOPramide  5 mg Oral TID  . pantoprazole  40 mg Oral Daily  . polyethylene glycol  17 g Oral Daily  . ranolazine  500 mg Oral BID  . sodium chloride flush  3 mL Intravenous Q12H   Continuous Infusions: . sodium chloride Stopped (07/06/19 0857)  . amiodarone 60 mg/hr (07/06/19 1219)  . heparin 950 Units/hr (07/06/19 1101)  . milrinone 0.25 mcg/kg/min (07/06/19 1000)  . norepinephrine (LEVOPHED) Adult infusion Stopped (07/01/19 0656)   PRN Meds: acetaminophen **OR** acetaminophen, calcium carbonate, levalbuterol, lidocaine (PF), ondansetron **OR** ondansetron (ZOFRAN) IV, sodium chloride flush, sorbitol, traMADol, traZODone   Vital Signs    Vitals:   07/06/19 0900 07/06/19 1000 07/06/19 1123 07/06/19 1222  BP: (!) 102/54 (!) 101/56  112/83  Pulse: 82 71  89  Resp: 13 12  17   Temp:   97.7 F (36.5 C)   TempSrc:      SpO2: 98% 99%  99%  Weight:      Height:        Intake/Output Summary (Last 24 hours) at 07/06/2019 1222 Last data filed at 07/06/2019 1000 Gross per 24 hour  Intake 1777.28 ml  Output 350 ml  Net 1427.28 ml   Filed Weights   07/04/19 0500 07/05/19 0500 07/06/19 0500  Weight: 72.1 kg 70.9 kg  70.4 kg    Physical Exam   Well developed and nourished in no acute distress HENT normal Neck supple  Clear Irregularly irregular rate and rhythm with controlled ventricular response, no murmurs or gallops Abd-soft with active BS without hepatomegaly No Clubbing cyanosis edema Skin-warm and dry A & Oriented  Grossly normal sensory and motor function    Labs    CBC Recent Labs    07/04/19 0318 07/05/19 0411  WBC 5.9 5.9  HGB 12.2* 11.9*  HCT 37.4* 35.8*  MCV 89.0 90.2  PLT 184 672   Basic Metabolic Panel Recent Labs    07/05/19 0411 07/06/19 0340  NA 132* 132*  K 3.8 4.1  CL 92* 96*  CO2 29 28  GLUCOSE 135* 125*  BUN 11 14  CREATININE 1.85* 2.04*  CALCIUM 9.1 9.1  MG 1.8 1.6*   Liver Function Tests No results for input(s): AST, ALT, ALKPHOS, BILITOT, PROT, ALBUMIN in the last 72 hours. No results for input(s): LIPASE, AMYLASE in the last 72 hours. Cardiac Enzymes No results for input(s): CKTOTAL, CKMB, CKMBINDEX, TROPONINI in the last 72 hours.   Telemetry    Telemetry Personally reviewed AFib with good rate control   Radiology    No results found.  Patient Profile     Glenn Gombert Dejournetteis a 69 y.o.malewith a past medical history significant for medical non-compliance, chronic systolic CHF, CVAs with hemorrhagic conversion, AF with RVR.hewas admitted for Acute on chronic systolic CHF with cardiogenic shock in the setting of AF with RVR.    Assessment & Plan    AF with RVR  NICM    Acute on chronic systolic CHF, cardiogenic shock on IABP and Milrinone   Kidney injury    ICD -[employer]  Nausea    Back in afib-- would be inclined to DCCV to optimize hemodynamics-- Cr going up and this may be loss of IABP but also loss of atrial kick    Very anxious, and given his issues not surprising  Would consider anxiolytic/antidepressant  Will ask Dr Reine Just about sertraline --  Nausea better and eating  Would change amio to po

## 2019-07-06 NOTE — Progress Notes (Addendum)
Patient ID: Glenn [name], male   DOB: 1951-02-18, 69 y.o.   MRN: [id number]    Advanced Heart Failure Rounding Note   Subjective:    06/25/19 IABP placed 06/30/19  TEE/DC-CV with conversion to NSR but went back in A fib.  07/02/19 Had repeat DCCV. Remains in NSR. HR 80s.  07/03/19 IABP removed  He went back into atrial fibrillation on night of 5/14.  Remains on heparin gtt and amiodarone 60 mg/hr. Remains in afib rates in the 90s.   Milrinone reduced over the weekend. Co-ox low at 46% on 0.125 of milrinone.    Scr trending back up 2.38>>2.25>>2.07>>1.94>>1.85>>2.04.  Poor UOP w/ 80 IV Lasix yesterday. Only 900 cc in UOP charted. CVP 4.   Resting comfortably in bed. No complaints at rest.   Objective:   Weight Range:  Vital Signs:   Temp:  [97.5 F (36.4 C)-98.6 F (37 C)] 98.6 F (37 C) (05/17 0400) Pulse Rate:  [39-88] 70 (05/17 0700) Resp:  [13-25] 13 (05/17 0700) BP: (100-137)/(59-95) 100/62 (05/17 0700) SpO2:  [96 %-100 %] 96 % (05/17 0700) Weight:  [70.4 kg] 70.4 kg (05/17 0500) Last BM Date: 07/05/19  Weight change: Filed Weights   07/04/19 0500 07/05/19 0500 07/06/19 0500  Weight: 72.1 kg 70.9 kg 70.4 kg    Intake/Output:   Intake/Output Summary (Last 24 hours) at 07/06/2019 0715 Last data filed at 07/06/2019 0700 Gross per 24 hour  Intake 1539.56 ml  Output 900 ml  Net 639.56 ml     Physical Exam: CVP 4 General:  Fatigue appearing AAM. No resp difficulty HEENT: normal Neck: supple. JVD ~6 cm  Carotids 2+ bilat; no bruits. No lymphadenopathy or thryomegaly appreciated. Cor: PMI nondisplaced. Irregular rhythm, regular rate. 2/6 TR Lungs: CTAB, no wheezing  Abdomen: soft, nontender, nondistended. No hepatosplenomegaly. No bruits or masses. Good bowel sounds. + biliary drain Extremities: no cyanosis, clubbing, rash, edema, previous IABP femoral access site stable  Neuro: alert & orientedx3, cranial nerves grossly intact. moves all 4 extremities w/o  difficulty. Affect pleasant   Telemetry:  Afib 80s-90s Personally reviewed  Labs: Basic Metabolic Panel: Recent Labs  Lab 06/29/19 1011 06/30/19 0400 07/02/19 0337 07/02/19 0337 07/02/19 1929 07/02/19 1939 07/03/19 0409 07/03/19 0409 07/04/19 0318 07/05/19 0411 07/06/19 0340  NA  --    < > 131*  --    < > 131* 132*  --  131* 132* 132*  K  --    < > 4.1  --    < > 3.3* 3.6  --  3.8 3.8 4.1  CL  --    < > 88*  --   --   --  88*  --  92* 92* 96*  CO2  --    < > 31  --   --   --  31  --  31 29 28   GLUCOSE  --    < > 122*  --   --   --  113*  --  123* 135* 125*  BUN  --    < > 20  --   --   --  17  --  14 11 14   CREATININE  --    < > 2.25*  --   --   --  2.07*  --  1.94* 1.85* 2.04*  CALCIUM  --    < > 9.3   < >  --   --  9.0   < > 9.1 9.1 9.1  MG 1.5*  --  1.8  --   --   --  2.0  --   --  1.8 1.6*   < > = values in this interval not displayed.    Liver Function Tests: Recent Labs  Lab 07/03/19 0409  AST 16  ALT 13  ALKPHOS 65  BILITOT 1.0  PROT 7.0  ALBUMIN 3.4*   No results for input(s): LIPASE, AMYLASE in the last 168 hours. No results for input(s): AMMONIA in the last 168 hours.  CBC: Recent Labs  Lab 07/01/19 0350 07/01/19 0350 07/02/19 0337 07/02/19 0337 07/02/19 1929 07/02/19 1939 07/03/19 0409 07/04/19 0318 07/05/19 0411  WBC 5.0  --  6.4  --   --   --  5.4 5.9 5.9  HGB 11.4*   < > 12.8*   < > 13.3 13.3 11.9* 12.2* 11.9*  HCT 35.0*   < > 38.9*   < > 39.0 39.0 36.4* 37.4* 35.8*  MCV 91.6  --  89.8  --   --   --  89.2 89.0 90.2  PLT 147*  --  159  --   --   --  151 184 199   < > = values in this interval not displayed.    Cardiac Enzymes: No results for input(s): CKTOTAL, CKMB, CKMBINDEX, TROPONINI in the last 168 hours.  BNP: BNP (last 3 results) Recent Labs    02/03/19 2007 06/01/19 1413 06/23/19 1527  BNP 922.2* 531.4* 613.2*    ProBNP (last 3 results) No results for input(s): PROBNP in the last 8760 hours.    Other  results:  Imaging: No results found.   Medications:     Scheduled Medications: . atorvastatin  20 mg Oral Daily  . atropine      . Chlorhexidine Gluconate Cloth  6 each Topical Daily  . digoxin  0.125 mg Oral Daily  . docusate sodium  100 mg Oral BID  . feeding supplement  1 Container Oral TID BM  . metoCLOPramide  5 mg Oral TID  . pantoprazole  40 mg Oral Daily  . polyethylene glycol  17 g Oral Daily  . ranolazine  500 mg Oral BID  . sodium chloride flush  3 mL Intravenous Q12H    Infusions: . sodium chloride 20 mL/hr at 07/06/19 0700  . amiodarone 60 mg/hr (07/06/19 0700)  . heparin Stopped (07/06/19 0430)  . milrinone 0.125 mcg/kg/min (07/06/19 0700)  . norepinephrine (LEVOPHED) Adult infusion Stopped (07/01/19 0656)    PRN Medications: acetaminophen **OR** acetaminophen, calcium carbonate, levalbuterol, lidocaine (PF), ondansetron **OR** ondansetron (ZOFRAN) IV, sodium chloride flush, sorbitol, traMADol, traZODone   Assessment/Plan:   1. Acute on Chronic Combined Systolic and Diastolic Heart Failure -> cardiogenic shock - 2/2 NICM. HF dates back to 2015.  - Most recent echo 4/21 w/ EF <20. RV mildly reduced. GIIIDD. Moderate TR. No LVH.  - [facility] 10/2018 showed normal cors, elevated filling pressures and low CI at 1.9  - Now admitted w/ NYHA IV symptoms, volume overload and worsening renal function, likely cardiorenal syndrome. SCr peaked to 2.97 (prior baseline 1.2).  - RHC 5/5 showed Low output biventricular failure and marked volume overload. PCWP 27. CI 2.0. - IABP placed 5/7 on 1:1 with good augmentation, removed 5/14.  - Appears to be milrinone dependent. Co-ox low at 46% on 0.125 mcg - Increase milrinone back up  to 0.25 mcg today  - CVP 4. Will hold diuretics today  - Poor LVAD candidate with RV dysfunction,  noncompliance and renal failure.  - Limited options. Appears to be inotrope dependent. Will consult palliative care to discuss [place].   2. AKI - due to  shock/cardiorenal - Creatinine peaked at 3 - Creatinine initially improved but trending back up after milrinone dose reduction  -Trend 2.38>>2.25>>2.07>>1.94>>1.85>>2.04.  - Increase Milrinone back to 0.25     3. Persistent Atrial Fibrillation w/ RVR - Recent DCCV attempt aborted 4/20 due to LAA thrombi on TEE - S/P DC-CV 06/30/19 with brief conversion to NSR but back in A fib - S/P repeat DCCV 5/13. Back in atrial fibrillation with controlled rate on 5/15.  - Remains in afib, V-rates in the 90s - Continue amio drip 60 mg per hour.   - will avoid?blocker currently given low output and acute decompensation - Continue ranolazine 500 mg bid.  Watch QT - Continue heparin. If co-ox stable will switch heparin back to [name] in am  - EP following => final option would be AV nodal ablation with BiV pacing, but rate is not significantly elevated and his baseline QRS is narrow.   4. NSVT - in the setting of severe LV dysfunction and milrinone  - continue IV amiodarone  - he has ICD - keep K >4.0 and Mg >2.0  - K 4.1. Mg 1.6, will supplement   5. Chronic Cholecystitis  - has perc biliary drain, stable. AF.  - WBC stable at 5.9 - management per surgery   6. Hypokalemia - Resolved. K 4.1 today   7. Persistent Nausea and Vomiting/Constipation  - improved with BM    Length of Stay: [place], PA-C  7:15 AM  Agree with above.   Was in NSR most of day yesterday and overnight. Nevertheless co-ox this am low (in NSR). Now back in AF. Creatinine slighty worse. Remains nauseated. Volume status ok .On heparin.   General:  Weak appearing. No resp difficulty HEENT: normal Neck: supple. no JVD. Carotids 2+ bilat; no bruits. No lymphadenopathy or thryomegaly appreciated. Cor: PMI nondisplaced. Irregular rate & rhythm. 2/6 MR/TR Lungs: clear Abdomen: soft, nontender, nondistended. No hepatosplenomegaly. No bruits or masses. Good bowel sounds. +biliary drain Extremities: no  cyanosis, clubbing, rash, edema. Cool  Neuro: alert & orientedx3, cranial nerves grossly intact. moves all 4 extremities w/o difficulty. Affect pleasant  Remains extremely tenuous. Co-ox low this am in NSR. Now back in AF. Options increasingly limited. Will continue IV amio and Ranexa. Continue heparin. If still in AF in am can consider repeat DCCV. D/w Dr. [name]. Would avoid SSRI given risk of QT prolongation in setting of amio and Ranexa.   CRITICAL CARE Performed by: Glori Bickers  Total critical care time: 35 minutes  Critical care time was exclusive of separately billable procedures and treating other patients.  Critical care was necessary to treat or prevent imminent or life-threatening deterioration.  Critical care was time spent personally by me (independent of midlevel providers or residents) on the following activities: development of treatment plan with patient and/or surrogate as well as nursing, discussions with consultants, evaluation of patient's response to treatment, examination of patient, obtaining history from patient or surrogate, ordering and performing treatments and interventions, ordering and review of laboratory studies, ordering and review of radiographic studies, pulse oximetry and re-evaluation of patient's condition.  Glori Bickers, MD  10:42 PM

## 2019-07-06 NOTE — Progress Notes (Signed)
Pt started bleeding from right femoral sheath site after turning to his left side.  Cards Fellow Dr. Hassell Done called and ordered Heparin gtt stopped and to be restarted 6 hours after bleeding has resolved.  Pressure was applied for 20 min and bleeding was resolved. Heparin will be able to be restarted at 10:30am if no further bleeding issues occur.  Will continue to monitor patient closely

## 2019-07-06 NOTE — Progress Notes (Signed)
Initial Nutrition Assessment  RD working remotely.  DOCUMENTATION CODES:   Not applicable  INTERVENTION:   - Continue Boost Breeze po TID, each supplement provides 250 kcal and 9 grams of protein  - Hormel Shake TID with meals, each supplement provides 520 kcal and 22 grams of protein  - Encourage adequate PO intake  NUTRITION DIAGNOSIS:   Increased nutrient needs related to chronic illness as evidenced by estimated needs.  GOAL:   Patient will meet greater than or equal to 90% of their needs  MONITOR:   PO intake, Supplement acceptance, Labs, Weight trends, I & O's  REASON FOR ASSESSMENT:   Malnutrition Screening Tool    ASSESSMENT:   69 year old male who presented with percutaneous cholecystostomy tube dysfunction and SOB. PMH of hemorrhagic stroke, PAF, ischemic cardiomyopathy, DVT, HTN, HLD, chronic cholecystitis with percutaneous cholecystostomy drain in place. Pt found to be in heart failure with rapid atrial fibrillation.  5/05 - IR replacement of cholecystostomy drain, Swan placed 5/07 - s/p IABP insertion 5/11 - s/p TEE/DC-CV 5/13 - s/p repeat DC-CV 5/14 - IABP removed  Noted plan for Palliative Care consult to discuss [place] given limited options. Per HF note, pt appears to be inotrope dependent.  RD was unable to reach pt via phone call to room. Reviewed RD notes from previous admission. Pt was taking and enjoying Hormel Shake supplements with meals. RD will order these in addition to [facility].  RD will attempt to obtain diet and weight history at follow-up.  Current weight: 70.4 kg Admit weight: 79.4 kg  Reviewed weight readings from previous encounters. Pt with a 26.7 kg weight loss since 10/29/18. This is a 27.5% weight loss in less than 9 months which is severe and significant for timeframe. Suspect some of weight loss can be attributed to fluid shifts. However, suspect pt with malnutrition given severity of weight loss. Unable to confirm at this time  without diet history or NFPE.  Per RN edema assessment, pt with non-pitting edema to BLE. This may be masking additional weight loss.  Meal Completion: 5-100% x last 8 recorded meals  Medications reviewed and include: colace, Boost Breeze TID, protonix, Reglan 5 mg TID, miralax, amiodarone, IV magnesium sulfate 4 grams once, milrinone IVF: NS @ 20 ml/hr  Labs reviewed: creatinine 2.04, magnesium 1.6  UOP: 900 ml x 24 hours I/O's: -4.1 L since admit  NUTRITION - FOCUSED PHYSICAL EXAM:  Unable to complete at this time. RD working remotely.  Diet Order:   Diet Order            Diet Heart Room service appropriate? Yes; Fluid consistency: Thin  Diet effective now              EDUCATION NEEDS:   No education needs have been identified at this time  Skin:  Skin Assessment: Reviewed RN Assessment  Last BM:  07/05/19 large type 4  Height:   Ht Readings from Last 1 Encounters:  07/02/19 5\' 7"  (1.702 m)    Weight:   Wt Readings from Last 1 Encounters:  07/06/19 70.4 kg    Ideal Body Weight:  67.3 kg  BMI:  Body mass index is 24.31 kg/m.  Estimated Nutritional Needs:   Kcal:  1900-2100  Protein:  95-110 grams  Fluid:  1.8 L/day    Gaynell Face, MS, RD, LDN Inpatient Clinical Dietitian Pager: [phone] Weekend/After Hours: [phone]

## 2019-07-06 NOTE — Progress Notes (Signed)
[facility] for Heparin Indication: atrial fibrillation  Allergies  Allergen Reactions  . Benadryl [Diphenhydramine] Palpitations    Patient Measurements: Height: 5\' 7"  (170.2 cm) Weight: 70.4 kg (155 lb 3.3 oz) IBW/kg (Calculated) : 66.1 Heparin Dosing Weight: 76.2 kg  Vital Signs: Temp: 97.6 F (36.4 C) (05/17 0744) Temp Source: Oral (05/17 0400) BP: 101/56 (05/17 1000) Pulse Rate: 71 (05/17 1000)  Labs: Recent Labs    07/04/19 0318 07/05/19 0411 07/06/19 0340  HGB 12.2* 11.9*  --   HCT 37.4* 35.8*  --   PLT 184 199  --   HEPARINUNFRC 0.50 0.55 0.60  CREATININE 1.94* 1.85* 2.04*    Estimated Creatinine Clearance: 32.9 mL/min (A) (by C-G formula based on SCr of 2.04 mg/dL (H)).   Medical History: Past Medical History:  Diagnosis Date  . Acute blood loss anemia   . Acute CVA (cerebrovascular accident) ([place])   . Acute deep vein thrombosis (DVT) of both lower extremities ([name])   . Acute kidney injury ([place])   . Acute on chronic combined systolic and diastolic CHF (congestive heart failure) ([place])   . Acute renal failure superimposed on stage 3a chronic kidney disease ([place])   . Acute respiratory failure with hypoxia ([place])   . Atrial fibrillation ([place])   . Carpal tunnel syndrome of right wrist 02/28/2018  . Cerebral edema ([place]) 11/13/2018  . Cerebral infarction ([place])   . CHF (congestive heart failure) ([place])   . Cholecystitis 02/04/2019  . Chronic right hip pain   . DCM (dilated cardiomyopathy) ([place])   . Dysphagia, post-stroke   . Elevated troponin   . Entrapment of right ulnar nerve 02/28/2018  . Headache due to intracranial disease 11/14/2018  . Hepatitis C   . History of hemorrhagic stroke with residual hemiparesis ([place]) 02/04/2019  . HTN (hypertension) 08/14/2016  . Hyperlipidemia LDL goal <70 11/13/2018  . Hypertension   . ICD (implantable cardioverter-defibrillator) in place 09/13/2016  . Impotence due to erectile  dysfunction 09/30/2017  . Ischemic cardiomyopathy   . Labile blood glucose   . Left leg DVT ([place]) 02/04/2019  . Marijuana user 11/13/2018  . Paroxysmal atrial fibrillation ([name])   . Right middle cerebral artery stroke ([place]) 11/13/2018  . Solitary pulmonary nodule 06/10/2017   5 mm RUL nodule noted incidentally as part of CVA workup 08/2016. With smoking history would obtain low-dose CT scan 08/2017.   . Stroke (cerebrum) ([name]) Lg L MCA infarct w/ hemorrhagic conversion, embolic d/t AF 06/21/8880  . Stroke ([place])   . Trochanteric bursitis, right hip 11/14/2018  . Visit for monitoring Tikosyn therapy 03/26/2017    Assessment: Pt is a 69 yo M with atrial fibrillation on dabigatran PTA. Pt reports last dose was the morning of 06/23/19.   Heparin for IABP placement 5/7 and AF. Heparin level is at upper end of therapeutic at 0.55 on 1050 units/hr. Heparin is infusing through right midline and levels are being drawn from right IJ.  In/out Afib on amiodarone drip  CBC ok but oozing at femoral sheath site - heparin held for a few hours overnight - gauze dressing clean/dry   Goal of Therapy:  Heparin level 0.3-0.5 (now off IABP) Monitor platelets by anticoagulation protocol: Yes   Plan:  - Restart heparin IV infusion at 950 units/hr - Monitor daily HL, CBC - Monitor for s/sx of bleeding  Bonnita Nasuti Pharm.D. CPP, BCPS Clinical Pharmacist [phone] 07/06/2019 10:20 AM

## 2019-07-07 ENCOUNTER — Other Ambulatory Visit (HOSPITAL_COMMUNITY): Payer: Self-pay | Admitting: Radiology

## 2019-07-07 DIAGNOSIS — K819 Cholecystitis, unspecified: Secondary | ICD-10-CM

## 2019-07-07 LAB — HEPARIN LEVEL (UNFRACTIONATED): Heparin Unfractionated: 0.24 IU/mL — ABNORMAL LOW (ref 0.30–0.70)

## 2019-07-07 LAB — BASIC METABOLIC PANEL
Anion gap: 10 (ref 5–15)
BUN: 14 mg/dL (ref 8–23)
CO2: 28 mmol/L (ref 22–32)
Calcium: 8.7 mg/dL — ABNORMAL LOW (ref 8.9–10.3)
Chloride: 94 mmol/L — ABNORMAL LOW (ref 98–111)
Creatinine, Ser: 1.99 mg/dL — ABNORMAL HIGH (ref 0.61–1.24)
GFR calc Af Amer: 39 mL/min — ABNORMAL LOW (ref >60)
GFR calc non Af Amer: 34 mL/min — ABNORMAL LOW (ref >60)
Glucose, Bld: 142 mg/dL — ABNORMAL HIGH (ref 70–99)
Potassium: 4.1 mmol/L (ref 3.5–5.1)
Sodium: 132 mmol/L — ABNORMAL LOW (ref 135–145)

## 2019-07-07 LAB — COOXEMETRY PANEL
Carboxyhemoglobin: 1.7 % — ABNORMAL HIGH (ref 0.5–1.5)
Carboxyhemoglobin: 2.1 % — ABNORMAL HIGH (ref 0.5–1.5)
Methemoglobin: 0.7 % (ref 0.0–1.5)
Methemoglobin: 1.1 % (ref 0.0–1.5)
O2 Saturation: 65.7 %
O2 Saturation: 61.8 %
Total hemoglobin: 10.3 g/dL — ABNORMAL LOW (ref 12.0–16.0)
Total hemoglobin: 10.3 g/dL — ABNORMAL LOW (ref 12.0–16.0)

## 2019-07-07 LAB — MAGNESIUM: Magnesium: 2 mg/dL (ref 1.7–2.4)

## 2019-07-07 MED ORDER — POTASSIUM CHLORIDE CRYS ER 20 MEQ PO TBCR
40.0000 meq | EXTENDED_RELEASE_TABLET | Freq: Once | ORAL | Status: AC
  Administered 2019-07-07: 40 meq via ORAL
  Filled 2019-07-07: qty 2

## 2019-07-07 MED ORDER — FUROSEMIDE 10 MG/ML IJ SOLN
80.0000 mg | Freq: Once | INTRAMUSCULAR | Status: AC
  Administered 2019-07-07: 80 mg via INTRAVENOUS
  Filled 2019-07-07: qty 8

## 2019-07-07 MED ORDER — ALPRAZOLAM 0.25 MG PO TABS
0.2500 mg | ORAL_TABLET | Freq: Two times a day (BID) | ORAL | Status: DC | PRN
  Administered 2019-07-07 – 2019-07-16 (×9): 0.25 mg via ORAL
  Filled 2019-07-07 (×9): qty 1

## 2019-07-07 NOTE — Progress Notes (Signed)
Referring Physician(s): B. Rosita Fire [place]  Supervising Physician: Corrie Mckusick  Patient Status:  [facility] - In-pt  Chief Complaint: Choldochololithiasis  Subjective: 69 y.o. male inpatient. History of acute on chronic choledocholithiasis s/p cholecystomy tube on 10.24.20 last exchanged on 5.4.21. Admitted for a fib RVR  Team is requesting evaluation and recommendation of existing cholecystomy tube. Patient alert and laying in bed, calm and comfortable. Denies any  abdominal pain, nausea, vomiting.    Allergies: Benadryl [diphenhydramine]  Medications: Prior to Admission medications   Medication Sig Start Date End Date Taking? Authorizing Provider  acetaminophen (TYLENOL) 325 MG tablet Take 2 tablets (650 mg total) by mouth every 4 (four) hours as needed for mild pain (or temp > 37.5 C (99.5 F)). 12/02/18  Yes Angiulli, Lavon Paganini, PA-C  albuterol (VENTOLIN HFA) 108 (90 Base) MCG/ACT inhaler Inhale 2 puffs into the lungs every 4 (four) hours as needed for wheezing or shortness of breath.  06/12/19  Yes [provider]  atorvastatin (LIPITOR) 20 MG tablet TAKE 1 TABLET(20 MG) BY MOUTH DAILY AT 6 PM Patient taking differently: Take 20 mg by mouth every evening.  03/13/19  Yes [name], MD  calcium carbonate (TUMS - DOSED IN MG ELEMENTAL CALCIUM) 500 MG chewable tablet Chew 1 tablet (200 mg of elemental calcium total) by mouth daily as needed for indigestion or heartburn. 03/12/19  Yes [name], MD  dabigatran ([name]) 150 MG CAPS capsule Take 1 capsule (150 mg total) by mouth 2 (two) times daily. 06/16/19  Yes Sherran Needs, NP  digoxin (LANOXIN) 0.125 MG tablet Take 1 tablet (0.125 mg total) by mouth daily. 05/12/19  Yes Baldwin [place], PA-C  furosemide (LASIX) 40 MG tablet TAKE 1 TABLET BY MOUTH DAILY WITH ONE EXTRA TABLET AS NEEDED FOR FLUID Patient taking differently: Take 40-80 mg by mouth See admin instructions. TAKE 1 TABLET BY MOUTH DAILY WITH ONE EXTRA TABLET  AS NEEDED FOR FLUID 06/16/19  Yes Sherran Needs, NP  ipratropium (ATROVENT HFA) 17 MCG/ACT inhaler Inhale 2 puffs into the lungs every 6 (six) hours as needed for wheezing. 03/16/19  Yes [name], MD  metoprolol succinate (TOPROL-XL) 100 MG 24 hr tablet Take one tablet by mouth daily Patient taking differently: Take 100 mg by mouth in the morning.  06/16/19  Yes Sherran Needs, NP  pantoprazole (PROTONIX) 40 MG tablet TAKE 1 TABLET(40 MG) BY MOUTH DAILY Patient taking differently: Take 40 mg by mouth daily.  03/13/19  Yes [name], MD  potassium chloride SA (KLOR-CON) 20 MEQ tablet TAKE 1 TABLET(20 MEQ) BY MOUTH DAILY Patient taking differently: Take 20 mEq by mouth daily.  04/20/19  Yes [name], MD  sacubitril-valsartan (ENTRESTO) 24-26 MG Take 1 tablet by mouth 2 (two) times daily. 03/12/19  Yes [name], MD  spironolactone (ALDACTONE) 25 MG tablet Take 1 tablet (25 mg total) by mouth daily. 06/16/19  Yes Sherran Needs, NP  traMADol (ULTRAM) 50 MG tablet Take 1 tablet (50 mg total) by mouth every 8 (eight) hours as needed for severe pain. 03/12/19  Yes [name], MD     Vital Signs: BP 126/65 (BP Location: Right Arm)   Pulse 82   Temp 98.6 F (37 C) (Oral)   Resp 18   Ht 5\' 7"  (1.702 m)   Wt 159 lb 2.8 oz (72.2 kg)   SpO2 100%   BMI 24.93 kg/m   Physical Exam Vitals and nursing note reviewed.  Constitutional:  Appearance: He is well-developed.  HENT:     Head: Normocephalic.  Pulmonary:     Effort: Pulmonary effort is normal.  Musculoskeletal:        General: Normal range of motion.     Cervical back: Normal range of motion.  Skin:    General: Skin is dry.     Comments: Positive RUQ drain  to gravity bag. Site is unremarkable with no erythema, edema, tenderness, bleeding or drainage noted at exit site. Suture and stat lock in place. Dressing is clean dry and intact. 5 ml of  Dark brown colored fluid noted in gravity bag. Drain is able to  be flushed easily.    Neurological:     Mental Status: He is alert and oriented to person, place, and time.     Imaging: No results found.  Labs:  CBC: Recent Labs    07/02/19 0337 07/02/19 1929 07/02/19 1939 07/03/19 0409 07/04/19 0318 07/05/19 0411  WBC 6.4  --   --  5.4 5.9 5.9  HGB 12.8*   < > 13.3 11.9* 12.2* 11.9*  HCT 38.9*   < > 39.0 36.4* 37.4* 35.8*  PLT 159  --   --  151 184 199   < > = values in this interval not displayed.    COAGS: Recent Labs    11/01/18 1814 11/03/18 0604 11/10/18 1753 11/10/18 2125 12/02/18 2018 12/04/18 0333 06/23/19 1235 06/24/19 0526 06/25/19 0421 07/02/19 1241  INR  --   --  1.2   < > 1.4*   < > 2.0* 2.0* 1.8* 1.2  APTT 35 51* 31  --  31  --   --   --   --   --    < > = values in this interval not displayed.    BMP: Recent Labs    07/04/19 0318 07/05/19 0411 07/06/19 0340 07/07/19 0418  NA 131* 132* 132* 132*  K 3.8 3.8 4.1 4.1  CL 92* 92* 96* 94*  CO2 31 29 28 28   GLUCOSE 123* 135* 125* 142*  BUN 14 11 14 14   CALCIUM 9.1 9.1 9.1 8.7*  CREATININE 1.94* 1.85* 2.04* 1.99*  GFRNONAA 35* 37* 33* 34*  GFRAA 40* 43* 38* 39*    LIVER FUNCTION TESTS: Recent Labs    06/24/19 0526 06/25/19 0421 06/26/19 1759 07/03/19 0409  BILITOT 1.1 1.1 1.2 1.0  AST 62* 55* 42* 16  ALT 32 41 39 13  ALKPHOS 75 67 70 65  PROT 6.9 6.4* 6.7 7.0  ALBUMIN 3.8 3.3* 3.3* 3.4*    Assessment and Plan:  69 y.o, male inpatient. History of acute on chronic choledocholithiasis s/p cholecystomy tube on 10.24.20 last exchanged on 5.4.21. Admitted for a fib RVR  Team is requesting evaluation and recommendation of existing cholecystomy tube .  Pertinent Imaging None since last exchange  Pertinent IR History 5.4.21 - Last exchange of cholecystomy tube 10.24.20 - Placement of cholecystomy tube 10.15.20 - IVC filter placement  Pertinent Allergies benadryl  Sodium 132, Cr 1.99, Calcium 8.7  All labs  are within acceptable  parameters. Patient is on a heparin gtt  Recommend team continue with flushing daily, output recording q shift and dressing changes as needed. Would consider additional imaging when output is less than 10 ml for 24 hours not including flush material.   Patient to be see in outpatient drain clinic q 3 months for evaluation and possible capping trail should patient's cystic duct appear to be open.  Recommend  patient continue to follow up with surgery for possibility of surgical candidacy for cholecstomy. Spoke to patient who is in agreement with plan of care. All questions answered. Team made aware.     Electronically Signed: Avel Peace, NP 07/07/2019, 1:19 PM   I spent a total of 15 Minutes at the the patient's bedside AND on the patient's hospital floor or unit, greater than 50% of which was counseling/coordinating care for cholecystomy tube evaluation

## 2019-07-07 NOTE — Evaluation (Signed)
Physical Therapy Evaluation Patient Details Name: Glenn [name] MRN: [id number] DOB: [birth year] Today's Date: 07/07/2019   History of Present Illness  69 y.o. presented and admitted 06/24/19 SOB and perc cholecystotomy tube dysfunction. S/p  R heart cath, central line insertion 06/24/19,  IABP insertion 06/25/19.  06/30/19  TEE/DC-CV with conversion to NSR but went back in A fib. 07/02/19 Had repeat DCCV. Remains in NSR. HR 80s. 07/03/19 IABP. 5/17 back in afib,  5/18 back in NSR removed. PMH of hemorrhagic stroke, PAF, Ischemic cardiomyopathy, DVT, HTN, HLD  Clinical Impression  Pt presents with no PT dx, independent functional mobility. PTA, pt lives with son in level entry apartment.Today, pt able to complete bed mobility, transfers independent and amb mod independent with RW and assistance for line management. Pt educated on having nurse present to assist with lines when mobilizing. Discussed no longer needing physical therapy services acutely and d/c home with no follow up services, which pt agreed. Pt d/c from mobility stand point, no longer requires physical therapy services acutely.   BP at rest 121/73 BP EOB 121/66   Follow Up Recommendations No PT follow up    Equipment Recommendations  None recommended by PT    Recommendations for Other Services       Precautions / Restrictions Precautions Precautions: None      Mobility  Bed Mobility Overal bed mobility: Independent                Transfers Overall transfer level: Independent Equipment used: Rolling walker (2 wheeled)                Ambulation/Gait Ambulation/Gait assistance: Modified independent (Device/Increase time) Gait Distance (Feet): 310 Feet Assistive device: Rolling walker (2 wheeled) Gait Pattern/deviations: Step-through pattern Gait velocity: decreased   General Gait Details: 310 ft with 1 seated rest break and assistance for lines.  Stairs            Wheelchair Mobility     Modified Rankin (Stroke Patients Only)       Balance Overall balance assessment: No apparent balance deficits (not formally assessed)                                           Pertinent Vitals/Pain Pain Assessment: Faces Faces Pain Scale: No hurt    Home Living Family/patient expects to be discharged to:: Private residence Living Arrangements: Children Available Help at Discharge: Family;Available PRN/intermittently Type of Home: Apartment Home Access: Level entry     Home Layout: One level Home Equipment: Cane - single point;Walker - standard;Bedside commode      Prior Function Level of Independence: Independent with assistive device(s)         Comments: Pt uses RW or cane intermittantly with household distances at baseline     Hand Dominance        Extremity/Trunk Assessment   Upper Extremity Assessment Upper Extremity Assessment: LUE deficits/detail LUE Deficits / Details: unable to lift, pt reports due to stroke, pt able to grip and use RW    Lower Extremity Assessment Lower Extremity Assessment: Overall WFL for tasks assessed    Cervical / Trunk Assessment Cervical / Trunk Assessment: Kyphotic  Communication   Communication: No difficulties  Cognition Arousal/Alertness: Awake/alert Behavior During Therapy: WFL for tasks assessed/performed Overall Cognitive Status: Within Functional Limits for tasks assessed  General Comments General comments (skin integrity, edema, etc.): Observed femoral incision site, drain site with no visable drainage through bandage pre and post session. Vitals remained stable during amb.    Exercises     Assessment/Plan    PT Assessment Patent does not need any further PT services  PT Problem List         PT Treatment Interventions      PT Goals (Current goals can be found in the Care Plan section)  Acute Rehab PT Goals PT Goal  Formulation: All assessment and education complete, DC therapy    Frequency     Barriers to discharge        Co-evaluation               AM-PAC PT "6 Clicks" Mobility  Outcome Measure Help needed turning from your back to your side while in a flat bed without using bedrails?: None Help needed moving from lying on your back to sitting on the side of a flat bed without using bedrails?: None Help needed moving to and from a bed to a chair (including a wheelchair)?: None Help needed standing up from a chair using your arms (e.g., wheelchair or bedside chair)?: None Help needed to walk in hospital room?: None Help needed climbing 3-5 steps with a railing? : None 6 Click Score: 24    End of Session Equipment Utilized During Treatment: Gait belt Activity Tolerance: Patient tolerated treatment well Patient left: in bed;with call bell/phone within reach Nurse Communication: Mobility status      Time: 1449-1530 PT Time Calculation (min) (ACUTE ONLY): 41 min   Charges:   PT Evaluation $PT Eval Moderate Complexity: 1 Mod PT Treatments $Gait Training: 8-22 mins $Therapeutic Exercise: 8-22 mins        [employer] SPT 07/07/2019   Rolland Porter 07/07/2019, 5:15 PM

## 2019-07-07 NOTE — TOC Initial Note (Addendum)
Transition of Care [facility]) - Initial/Assessment Note    Patient Details  Name: Glenn [name] MRN: [id number] Date of Birth: 1951-01-19  Transition of Care [facility]) CM/SW Contact:    Sharin Mons, RN Phone Number: [phone] 07/07/2019, 4:51 PM  Clinical Narrative:   Admitted with Acute hypoxic respiratory failure,hospital course afib, CHF.               Hx of hemorrhagic stroke, PAF, Ischemic cardiomyopathy, DVT, HTN, HLD presented with a SOB and perc cholecystotomy tube dysfunction. From home with son. Pt states doesn't want to d/c back home with son 2/2 son's "negative spirit". Have requested information regarding section 8 housing vs other housing alternatives ... CSW to provide pt with information , resources. Pt also stated will f/u with family regarding support once d/c.   Pt will probably need HHRN when d/c. States he would like to use [facility] for services pending MD orders....  Pt states has had problem with his  current outpatient pharmacy not having medication in stock with refill medication. NCM called pt's local pharmacy. Pharmacist stated pt not consistent with picking up  filled medication. NCM to f/u with pt and shared pharmacist concern.   TOC team following and monitoring for needs....  Expected Discharge Plan: [place] Barriers to Discharge: Continued Medical Work up   Patient Goals and CMS Choice   CMS Medicare.gov Compare Post Acute Care list provided to:: Patient Choice offered to / list presented to : Patient(Pt requested [facility], pt has used in the past)  Expected Discharge Plan and Services Expected Discharge Plan: [place]   Discharge Planning Services: CM Consult(Pt states sometimes refills aren't available @ pharmarcy, outof stock) Post Acute Care Choice: [place] arrangements for the past 2 months: Apartment                 DME Arranged: N/A DME Agency: NA       HH  Arranged: RN [place] Agency: [facility] Date Deputy: 07/07/19 Time [place]: [place] Representative spoke with at [place]: [place] Arrangements/Services Living arrangements for the past 2 months: Apartment Lives with:: Adult Children   Do you feel safe going back to the place where you live?: Yes               Activities of Daily Living Home Assistive Devices/Equipment: None ADL Screening (condition at time of admission) Patient's cognitive ability adequate to safely complete daily activities?: Yes Is the patient deaf or have difficulty hearing?: No Does the patient have difficulty seeing, even when wearing glasses/contacts?: No Does the patient have difficulty concentrating, remembering, or making decisions?: No Patient able to express need for assistance with ADLs?: Yes Does the patient have difficulty dressing or bathing?: No Independently performs ADLs?: Yes (appropriate for developmental age) Does the patient have difficulty walking or climbing stairs?: No Weakness of Legs: Left Weakness of Arms/Hands: Left  Permission Sought/Granted Permission sought to share information with : Case Manager                Emotional Assessment   Attitude/Demeanor/Rapport: Engaged Affect (typically observed): Accepting Orientation: : Oriented to Self, Oriented to Place, Oriented to  Time, Oriented to Situation Alcohol / Substance Use: Not Applicable Psych Involvement: No (comment)  Admission diagnosis:  Acute cholecystitis [K81.0] Atrial fibrillation with RVR ([place]) [I48.91] Patient Active Problem List   Diagnosis Date Noted  . Atrial  fibrillation with RVR ([place]) 06/23/2019  . Chronic systolic heart failure ([place]) 06/21/2019  . Persistent atrial fibrillation with RVR ([place])   . Thrombus of left atrial appendage   . Left leg pain 03/27/2019  . History of hemorrhagic stroke with residual hemiparesis ([place]) 02/04/2019  . Left leg DVT ([place])  02/04/2019  . Cholecystitis 02/04/2019  . Acute deep vein thrombosis (DVT) of both lower extremities ([name])   . Acute blood loss anemia   . Labile blood glucose   . AKI (acute kidney injury) ([place])   . Chronic right hip pain   . Acute renal failure ([place])   . Headache due to intracranial disease 11/14/2018  . Trochanteric bursitis, right hip 11/14/2018  . Cerebral edema ([place]) 11/13/2018  . Hyperlipidemia LDL goal <70 11/13/2018  . Marijuana user 11/13/2018  . Right middle cerebral artery stroke ([place]) 11/13/2018  . Acute CVA (cerebrovascular accident) ([place])   . Noncompliance   . Dysphagia, post-stroke   . Acute on chronic combined systolic and diastolic CHF (congestive heart failure) ([place])   . DCM (dilated cardiomyopathy) ([place])   . Atrial fibrillation ([place]) 11/04/2018  . Elevated troponin   . Acute respiratory failure with hypoxia ([place])   . Acute kidney injury ([place])   . Entrapment of right ulnar nerve 02/28/2018  . Carpal tunnel syndrome of right wrist 02/28/2018  . Impotence due to erectile dysfunction 09/30/2017  . Solitary pulmonary nodule 06/10/2017  . Neck pain 04/06/2017  . Paroxysmal atrial fibrillation ([name])   . ICD (implantable cardioverter-defibrillator) in place 09/13/2016  . Chest pain 09/13/2016  . Tobacco abuse 09/13/2016  . Hospital discharge follow-up 09/13/2016  . Upper back pain 09/13/2016  . Housing problems 09/13/2016  . Ischemic cardiomyopathy   . Cerebral infarction ([place])   . Stroke (cerebrum) ([name]) Lg L MCA infarct w/ hemorrhagic conversion, embolic d/t AF 62/56/3893  . HTN (hypertension) 08/14/2016  . Chronic Hepatitis C  08/14/2016   PCP:  [name], MD Pharmacy:   [facility] [place], [place] AT [place] [address] [place] [address] Phone: [phone] Fax: [phone]     Social Determinants of Health (SDOH) Interventions    Readmission Risk  Interventions Readmission Risk Prevention Plan 02/05/2019  Transportation Screening Complete  Medication Review ([place]) Complete  PCP or Specialist appointment within 3-5 days of discharge Complete  HRI or [place] Complete  SW Recovery Care/Counseling Consult Complete  [place] Not Applicable  Some recent data might be hidden

## 2019-07-07 NOTE — Progress Notes (Signed)
[place] for Heparin Indication: atrial fibrillation  Allergies  Allergen Reactions  . Benadryl [Diphenhydramine] Palpitations    Patient Measurements: Height: 5\' 7"  (170.2 cm) Weight: 72.2 kg (159 lb 2.8 oz) IBW/kg (Calculated) : 66.1 Heparin Dosing Weight: 76.2 kg  Vital Signs: Temp: 98 F (36.7 C) (05/18 0827) Temp Source: Oral (05/18 0827) BP: 124/70 (05/18 0636) Pulse Rate: 85 (05/18 0656)  Labs: Recent Labs    07/05/19 0411 07/06/19 0340 07/07/19 0418  HGB 11.9*  --   --   HCT 35.8*  --   --   PLT 199  --   --   HEPARINUNFRC 0.55 0.60 0.24*  CREATININE 1.85* 2.04* 1.99*    Estimated Creatinine Clearance: 33.7 mL/min (A) (by C-G formula based on SCr of 1.99 mg/dL (H)).   Medical History: Past Medical History:  Diagnosis Date  . Acute blood loss anemia   . Acute CVA (cerebrovascular accident) ([place])   . Acute deep vein thrombosis (DVT) of both lower extremities ([name])   . Acute kidney injury ([place])   . Acute on chronic combined systolic and diastolic CHF (congestive heart failure) ([place])   . Acute renal failure superimposed on stage 3a chronic kidney disease ([place])   . Acute respiratory failure with hypoxia ([place])   . Atrial fibrillation ([place])   . Carpal tunnel syndrome of right wrist 02/28/2018  . Cerebral edema ([place]) 11/13/2018  . Cerebral infarction ([place])   . CHF (congestive heart failure) ([place])   . Cholecystitis 02/04/2019  . Chronic right hip pain   . DCM (dilated cardiomyopathy) ([place])   . Dysphagia, post-stroke   . Elevated troponin   . Entrapment of right ulnar nerve 02/28/2018  . Headache due to intracranial disease 11/14/2018  . Hepatitis C   . History of hemorrhagic stroke with residual hemiparesis ([place]) 02/04/2019  . HTN (hypertension) 08/14/2016  . Hyperlipidemia LDL goal <70 11/13/2018  . Hypertension   . ICD (implantable cardioverter-defibrillator) in place 09/13/2016  . Impotence due to erectile  dysfunction 09/30/2017  . Ischemic cardiomyopathy   . Labile blood glucose   . Left leg DVT ([place]) 02/04/2019  . Marijuana user 11/13/2018  . Paroxysmal atrial fibrillation ([name])   . Right middle cerebral artery stroke ([place]) 11/13/2018  . Solitary pulmonary nodule 06/10/2017   5 mm RUL nodule noted incidentally as part of CVA workup 08/2016. With smoking history would obtain low-dose CT scan 08/2017.   . Stroke (cerebrum) ([name]) Lg L MCA infarct w/ hemorrhagic conversion, embolic d/t AF 03/17/5168  . Stroke ([place])   . Trochanteric bursitis, right hip 11/14/2018  . Visit for monitoring Tikosyn therapy 03/26/2017    Assessment: Pt is a 69 yo M with atrial fibrillation on dabigatran PTA. Pt reports last dose was the morning of 06/23/19.   Heparin for IABP placement 5/7 and AF. Heparin level 0.24 slightly < goal on heparin drip rate 950 units/hr. Heparin is infusing through right midline and levels are being drawn from right IJ.  In/out Afib on amiodarone drip  CBC ok  oozing at femoral sheath site 5/17 resolved    Goal of Therapy:  Heparin level 0.3-0.5 (now off IABP) Monitor platelets by anticoagulation protocol: Yes   Plan:  Increase heparin IV infusion at 1000 units/hr - Monitor daily HL, CBC - Monitor for s/sx of bleeding  Bonnita Nasuti Pharm.D. CPP, BCPS Clinical Pharmacist [phone] 07/07/2019 9:07 AM

## 2019-07-07 NOTE — Progress Notes (Addendum)
Patient ID: Glenn [name], male   DOB: 1951/01/31, 69 y.o.   MRN: [id number]    Advanced Heart Failure Rounding Note   Subjective:    06/25/19 IABP placed 06/30/19  TEE/DC-CV with conversion to NSR but went back in A fib.  07/02/19 Had repeat DCCV.  07/03/19 IABP removed 5/17 back in AF 5/18 back in NSR   Remains on IV amiodarone and 0.25 of milrinone. Co-ox better today at 66%.   SCr trending back down, 2.04>>1.99.   Wt up 4 lb today. CVP ~16.   Feeling better. Less fatigue. No resting dyspnea. Appetite improving.    Objective:   Weight Range:  Vital Signs:   Temp:  [97.7 F (36.5 C)-99 F (37.2 C)] 98 F (36.7 C) (05/18 0827) Pulse Rate:  [71-89] 85 (05/18 0656) Resp:  [12-23] 23 (05/18 0656) BP: (101-135)/(54-83) 124/70 (05/18 0636) SpO2:  [98 %-100 %] 98 % (05/18 0656) Weight:  [72.2 kg] 72.2 kg (05/18 0500) Last BM Date: 07/05/19  Weight change: Filed Weights   07/05/19 0500 07/06/19 0500 07/07/19 0500  Weight: 70.9 kg 70.4 kg 72.2 kg    Intake/Output:   Intake/Output Summary (Last 24 hours) at 07/07/2019 0842 Last data filed at 07/07/2019 0700 Gross per 24 hour  Intake 1766.04 ml  Output 260 ml  Net 1506.04 ml     Physical Exam: CVP 16 General:  Mildly fatigue appearing AAM. Laying in bed No resp difficulty HEENT: normal Neck: supple. JVD elevated to ear  Carotids 2+ bilat; no bruits. No lymphadenopathy or thryomegaly appreciated. Cor: PMI nondisplaced. RRR 2/6 MR murmur at apex  Lungs: clear  Abdomen: soft, nontender, nondistended. No hepatosplenomegaly. No bruits or masses. Good bowel sounds. + biliary drain Extremities: no cyanosis, clubbing, rash, edema, previous IABP femoral access site stable  Neuro: alert & orientedx3, cranial nerves grossly intact. moves all 4 extremities w/o difficulty. Affect pleasant   Telemetry:  NSR 80s Personally reviewed  Labs: Basic Metabolic Panel: Recent Labs  Lab 07/02/19 0337 07/02/19 1929 07/03/19 0409  07/03/19 0409 07/04/19 0318 07/04/19 0318 07/05/19 0411 07/06/19 0340 07/07/19 0418  NA 131*   < > 132*  --  131*  --  132* 132* 132*  K 4.1   < > 3.6  --  3.8  --  3.8 4.1 4.1  CL 88*  --  88*  --  92*  --  92* 96* 94*  CO2 31  --  31  --  31  --  29 28 28   GLUCOSE 122*  --  113*  --  123*  --  135* 125* 142*  BUN 20  --  17  --  14  --  11 14 14   CREATININE 2.25*  --  2.07*  --  1.94*  --  1.85* 2.04* 1.99*  CALCIUM 9.3  --  9.0   < > 9.1   < > 9.1 9.1 8.7*  MG 1.8  --  2.0  --   --   --  1.8 1.6* 2.0   < > = values in this interval not displayed.    Liver Function Tests: Recent Labs  Lab 07/03/19 0409  AST 16  ALT 13  ALKPHOS 65  BILITOT 1.0  PROT 7.0  ALBUMIN 3.4*   No results for input(s): LIPASE, AMYLASE in the last 168 hours. No results for input(s): AMMONIA in the last 168 hours.  CBC: Recent Labs  Lab 07/01/19 0350 07/01/19 0350 07/02/19 0337 07/02/19 0337 07/02/19  1929 07/02/19 1939 07/03/19 0409 07/04/19 0318 07/05/19 0411  WBC 5.0  --  6.4  --   --   --  5.4 5.9 5.9  HGB 11.4*   < > 12.8*   < > 13.3 13.3 11.9* 12.2* 11.9*  HCT 35.0*   < > 38.9*   < > 39.0 39.0 36.4* 37.4* 35.8*  MCV 91.6  --  89.8  --   --   --  89.2 89.0 90.2  PLT 147*  --  159  --   --   --  151 184 199   < > = values in this interval not displayed.    Cardiac Enzymes: No results for input(s): CKTOTAL, CKMB, CKMBINDEX, TROPONINI in the last 168 hours.  BNP: BNP (last 3 results) Recent Labs    02/03/19 2007 06/01/19 1413 06/23/19 1527  BNP 922.2* 531.4* 613.2*    ProBNP (last 3 results) No results for input(s): PROBNP in the last 8760 hours.    Other results:  Imaging: No results found.   Medications:     Scheduled Medications: . atorvastatin  20 mg Oral Daily  . Chlorhexidine Gluconate Cloth  6 each Topical Daily  . digoxin  0.125 mg Oral Daily  . docusate sodium  100 mg Oral BID  . feeding supplement  1 Container Oral TID BM  . metoCLOPramide  5 mg  Oral TID  . pantoprazole  40 mg Oral Daily  . polyethylene glycol  17 g Oral Daily  . ranolazine  500 mg Oral BID  . sodium chloride flush  3 mL Intravenous Q12H    Infusions: . sodium chloride 10 mL/hr at 07/07/19 0700  . amiodarone 60 mg/hr (07/07/19 0700)  . heparin 950 Units/hr (07/07/19 0700)  . milrinone 0.25 mcg/kg/min (07/07/19 0700)  . norepinephrine (LEVOPHED) Adult infusion Stopped (07/01/19 0656)    PRN Medications: acetaminophen **OR** acetaminophen, calcium carbonate, levalbuterol, lidocaine (PF), ondansetron **OR** ondansetron (ZOFRAN) IV, sodium chloride flush, sorbitol, traMADol, traZODone   Assessment/Plan:   1. Acute on Chronic Combined Systolic and Diastolic Heart Failure -> cardiogenic shock - 2/2 NICM. HF dates back to 2015.  - Most recent echo 4/21 w/ EF <20. RV mildly reduced. GIIIDD. Moderate TR. No LVH.  - [facility] 10/2018 showed normal cors, elevated filling pressures and low CI at 1.9  - Now admitted w/ NYHA IV symptoms, volume overload and worsening renal function, likely cardiorenal syndrome. SCr peaked to 2.97 (prior baseline 1.2).  - RHC 5/5 showed Low output biventricular failure and marked volume overload. PCWP 27. CI 2.0. - IABP placed 5/7 on 1:1 with good augmentation, removed 5/14.  - Co-ox 66% on 0.25 of milrinone.  - Reduce milrinone to 0.2  today and follow co-ox - volume status up, CVP ~16. Wt trending up - Give dose of IV Lasix 80 mg x 1 and monitor response   - Poor LVAD candidate with RV dysfunction, noncompliance and renal failure.  - Limited options.  Will consult palliative care to discuss [place].  - if we are unable to wean off milrinone, may need to consider home inotropes   2. AKI - due to shock/cardiorenal - Creatinine peaked at 3 - SCr trending back down, 2.38>>2.25>>2.07>>1.94>>1.85>>2.04>>1.99.  - Monitor w/ milrinone wean and w/ diuresis     3. Persistent Atrial Fibrillation w/ RVR - Recent DCCV attempt aborted 4/20 due to  LAA thrombi on TEE - S/P DC-CV 06/30/19 with brief conversion to NSR but back in A fib - S/P  repeat DCCV 5/13.  - Back in AF 5/15. Rebolused w/ amiodarone - currently in NSR - will try to wean Milrinone - Continue amio drip 60 mg per hour.   - will avoid?blocker currently given low output and acute decompensation - Continue ranolazine 500 mg bid.  Watch QT - Continue digoxin 0.125 qd  - Continue heparin. Plan transition back to [name] soon (will await IR's recs regarding biliary drain) - EP following => final option would be AV nodal ablation with BiV pacing, but rate is not significantly elevated and his baseline QRS is narrow.   4. NSVT - in the setting of severe LV dysfunction and milrinone  - continue IV amiodarone  - he has ICD - keep K >4.0 and Mg >2.0  - K 4.1. Mg 2.0 today   5. Chronic Cholecystitis  - has perc biliary drain - will ask IR to reassess and give guidance regarding ongoing management   6. Hypokalemia - Resolved. K 4.1 today  - monitor w/ diuresis   7. Persistent Nausea and Vomiting/Constipation  - improved with BM    Length of Stay: [address], PA-C  8:42 AM  Patient seen and examined with the above-signed Advanced Practice Provider and/or Housestaff. I personally reviewed laboratory data, imaging studies and relevant notes. I independently examined the patient and formulated the important aspects of the plan. I have edited the note to reflect any of my changes or salient points. I have personally discussed the plan with the patient and/or family.  Back in NSR on IV amio. Remains on milrinone 0.25. Co-ox improved. Feels a bit better today. Nausea improved. No dyspnea.   General:  Sitting up in bed . No resp difficulty HEENT: normal Neck: supple. no JVD. Carotids 2+ bilat; no bruits. No lymphadenopathy or thryomegaly appreciated. Cor: PMI nondisplaced. Regular rate & rhythm. No rubs, gallops or murmurs. Lungs: clear Abdomen: soft,  nontender, nondistended. No hepatosplenomegaly. No bruits or masses. Good bowel sounds. + biliary drain  Extremities: no cyanosis, clubbing, rash, edema Neuro: alert & orientedx3, cranial nerves grossly intact. moves all 4 extremities w/o difficulty. Affect pleasant  Plan 1. Wean milrinone slowly to 0.2 2. Follow co-ox CVP 3. Continue IV amio and Ranexa 4. Consult IR to help manage biliary drain 5. Restart Xarelto once seen by IR.  6. If stays in NSR x 24 hours can go to PCU 7. Mobilize  Glori Bickers, MD  10:33 AM

## 2019-07-07 NOTE — TOC Progression Note (Signed)
Transition of Care (TOC) - Progression Note    CSW spoke with patient at bedside. CSW asked patient if he would like housing resources. Patient accepted housing resources. CSW let patient know if they have any questions to please let her know. Patient said they would and thank you.  CSW will continue to follow for any other needs.   Patient Details  Name: Glenn [name] MRN: [id number] Date of Birth: [birth year]  Transition of Care [facility]) CM/SW [place], [place] Phone Number: 07/07/2019, 4:38 PM  Clinical Narrative:         Barriers to Discharge: Continued Medical Work up  Expected Discharge Plan and Services                                                 Social Determinants of Health (SDOH) Interventions    Readmission Risk Interventions Readmission Risk Prevention Plan 02/05/2019  Transportation Screening Complete  Medication Review ([place]) Complete  PCP or Specialist appointment within 3-5 days of discharge Complete  HRI or [place] Complete  SW Recovery Care/Counseling Consult Complete  [place] Not Applicable  Some recent data might be hidden

## 2019-07-07 NOTE — Plan of Care (Signed)
  Problem: Education: Goal: Knowledge of General Education information will improve Description: Including pain rating scale, medication(s)/side effects and non-pharmacologic comfort measures Outcome: Progressing   Problem: Clinical Measurements: Goal: Respiratory complications will improve Outcome: Progressing   Problem: Clinical Measurements: Goal: Cardiovascular complication will be avoided Outcome: Progressing   

## 2019-07-08 DIAGNOSIS — Z7189 Other specified counseling: Secondary | ICD-10-CM

## 2019-07-08 DIAGNOSIS — Z515 Encounter for palliative care: Secondary | ICD-10-CM

## 2019-07-08 LAB — HEPARIN LEVEL (UNFRACTIONATED): Heparin Unfractionated: 0.3 IU/mL (ref 0.30–0.70)

## 2019-07-08 LAB — BASIC METABOLIC PANEL
Anion gap: 9 (ref 5–15)
BUN: 19 mg/dL (ref 8–23)
CO2: 28 mmol/L (ref 22–32)
Calcium: 8.6 mg/dL — ABNORMAL LOW (ref 8.9–10.3)
Chloride: 93 mmol/L — ABNORMAL LOW (ref 98–111)
Creatinine, Ser: 1.82 mg/dL — ABNORMAL HIGH (ref 0.61–1.24)
GFR calc Af Amer: 44 mL/min — ABNORMAL LOW (ref >60)
GFR calc non Af Amer: 38 mL/min — ABNORMAL LOW (ref >60)
Glucose, Bld: 136 mg/dL — ABNORMAL HIGH (ref 70–99)
Potassium: 4 mmol/L (ref 3.5–5.1)
Sodium: 130 mmol/L — ABNORMAL LOW (ref 135–145)

## 2019-07-08 LAB — CBC
HCT: 30.4 % — ABNORMAL LOW (ref 39.0–52.0)
Hemoglobin: 9.7 g/dL — ABNORMAL LOW (ref 13.0–17.0)
MCH: 29.4 pg (ref 26.0–34.0)
MCHC: 31.9 g/dL (ref 30.0–36.0)
MCV: 92.1 fL (ref 80.0–100.0)
Platelets: 227 10*3/uL (ref 150–400)
RBC: 3.3 MIL/uL — ABNORMAL LOW (ref 4.22–5.81)
RDW: 14.6 % (ref 11.5–15.5)
WBC: 4.9 10*3/uL (ref 4.0–10.5)
nRBC: 0 % (ref 0.0–0.2)

## 2019-07-08 LAB — COOXEMETRY PANEL
Carboxyhemoglobin: 1.7 % — ABNORMAL HIGH (ref 0.5–1.5)
Methemoglobin: 0.5 % (ref 0.0–1.5)
O2 Saturation: 61.2 %
Total hemoglobin: 10.2 g/dL — ABNORMAL LOW (ref 12.0–16.0)

## 2019-07-08 LAB — MAGNESIUM: Magnesium: 1.7 mg/dL (ref 1.7–2.4)

## 2019-07-08 MED ORDER — MAGNESIUM SULFATE 2 GM/50ML IV SOLN
2.0000 g | Freq: Once | INTRAVENOUS | Status: AC
  Administered 2019-07-08: 2 g via INTRAVENOUS
  Filled 2019-07-08: qty 50

## 2019-07-08 MED ORDER — POTASSIUM CHLORIDE CRYS ER 20 MEQ PO TBCR
20.0000 meq | EXTENDED_RELEASE_TABLET | Freq: Once | ORAL | Status: AC
  Administered 2019-07-08: 20 meq via ORAL
  Filled 2019-07-08: qty 1

## 2019-07-08 MED ORDER — APIXABAN 5 MG PO TABS
5.0000 mg | ORAL_TABLET | Freq: Two times a day (BID) | ORAL | Status: DC
  Administered 2019-07-08 – 2019-07-16 (×17): 5 mg via ORAL
  Filled 2019-07-08 (×17): qty 1

## 2019-07-08 MED ORDER — SERTRALINE HCL 25 MG PO TABS
25.0000 mg | ORAL_TABLET | Freq: Every day | ORAL | Status: DC
  Administered 2019-07-08 – 2019-07-16 (×9): 25 mg via ORAL
  Filled 2019-07-08 (×9): qty 1

## 2019-07-08 MED ORDER — AMIODARONE HCL 200 MG PO TABS
200.0000 mg | ORAL_TABLET | Freq: Three times a day (TID) | ORAL | Status: DC
  Administered 2019-07-08 – 2019-07-16 (×25): 200 mg via ORAL
  Filled 2019-07-08 (×24): qty 1

## 2019-07-08 MED ORDER — FUROSEMIDE 10 MG/ML IJ SOLN
80.0000 mg | Freq: Once | INTRAMUSCULAR | Status: AC
  Administered 2019-07-08: 80 mg via INTRAVENOUS
  Filled 2019-07-08: qty 8
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
## 2019-07-08 NOTE — Consult Note (Signed)
Consultation Note Date: 07/08/2019   Patient Name: Glenn [name]  DOB: 1950/12/09  MRN: [id number]  Age / Sex: 69 y.o., male  PCP: Glenn Dawn, MD Referring Physician: Jolaine Artist, MD  Reason for Consultation: Establishing goals of care  HPI/Patient Profile: 69 y.o. male  with past medical history of CHF EF < 20%, ICM, atrial fibrillation, VT, s/p [employer] AICD, HTN, HLD, COPD, CKD stage 3a, cholecystotomy drain with chronic cholecystitis admitted on 06/23/2019 with palpitations due to atrial fibrillation with RVR as well as biliary drain dysfunction. He has required IABP 5/6-5/14, amiodarone and milrinone infusions along with DCCV 5/11 and 5/13. Overall with some slow improvement with attempts to wean milrinone.   Clinical Assessment and Goals of Care: I have reviewed records and discussed with bedside RN current status and events of hospitalization.   I met today with Glenn [name] and no family/visitors at bedside. We discussed his overall health and severity of his heart disease. He shares that he was unaware that his heart was that bad. We discussed his symptoms and complications that led to hospitalization and issues during his hospitalization is directly related to the severity of his heart disease. When asked about his concerns he shares with me his main concerns for anxiety (just started on Zoloft) and housing as he does not wish to return to live with his son and has a list of section 8 housing. However, if he is unable to obtain housing he would be willing to return to his son's home. He does share that his son helps him with medications and meals and this has been helpful after his stroke but he would like to live on his own again.   We further discussed what could happen with his severe heart disease and what his wishes would be if the worse were to happen. We discussed  CPR/ventilation/resuscitation and he quickly shares that he would want everything done. I explained that with his poor heart function there would be concern that these interventions could lead to more suffering at the end of his life. He then tells me that he does not want to suffer at the end of his life. I further clarified that he would want full aggressive care but if not improving he would not want to be left on life support for long. I clarified who would be the person to act as his surrogate decision maker and he names his cousin Glenn [name] as the person he would trust to make healthcare decisions for him. Will request spiritual care to help document HCPOA according to [facility] wishes. He gives me permission to call and share all information and discussion with Glenn [name] (I will reach out to him tomorrow).   All questions/concerns addressed. Emotional support provided.   Primary Decision Maker PATIENT    SUMMARY OF RECOMMENDATIONS   - Full code but would not want long term life support - Would desire cousin, Glenn [name], to be his HCPOA  Code Status/Advance Care Planning:  Full code   Symptom Management:  Per heart failure team  Palliative Prophylaxis:   Delirium Protocol  Additional Recommendations (Limitations, Scope, Preferences):  Full Scope Treatment  Psycho-social/Spiritual:   Desire for further Chaplaincy support:no  Additional Recommendations: Referral to Community Resources   Prognosis:   Overall prognosis poor with advanced heart failure.   Discharge Planning: To Be Determined      Primary Diagnoses: Present on Admission: . Atrial fibrillation with RVR ([place])   I have reviewed the medical record, interviewed the patient and family, and examined the patient. The following aspects are pertinent.  Past Medical History:  Diagnosis Date  . Acute blood loss anemia   . Acute CVA (cerebrovascular accident) ([place])   . Acute deep vein thrombosis (DVT) of both  lower extremities ([name])   . Acute kidney injury ([place])   . Acute on chronic combined systolic and diastolic CHF (congestive heart failure) ([place])   . Acute renal failure superimposed on stage 3a chronic kidney disease ([place])   . Acute respiratory failure with hypoxia ([place])   . Atrial fibrillation ([place])   . Carpal tunnel syndrome of right wrist 02/28/2018  . Cerebral edema ([place]) 11/13/2018  . Cerebral infarction ([place])   . CHF (congestive heart failure) ([place])   . Cholecystitis 02/04/2019  . Chronic right hip pain   . DCM (dilated cardiomyopathy) ([place])   . Dysphagia, post-stroke   . Elevated troponin   . Entrapment of right ulnar nerve 02/28/2018  . Headache due to intracranial disease 11/14/2018  . Hepatitis C   . History of hemorrhagic stroke with residual hemiparesis ([place]) 02/04/2019  . HTN (hypertension) 08/14/2016  . Hyperlipidemia LDL goal <70 11/13/2018  . Hypertension   . ICD (implantable cardioverter-defibrillator) in place 09/13/2016  . Impotence due to erectile dysfunction 09/30/2017  . Ischemic cardiomyopathy   . Labile blood glucose   . Left leg DVT ([place]) 02/04/2019  . Marijuana user 11/13/2018  . Paroxysmal atrial fibrillation ([name])   . Right middle cerebral artery stroke ([place]) 11/13/2018  . Solitary pulmonary nodule 06/10/2017   5 mm RUL nodule noted incidentally as part of CVA workup 08/2016. With smoking history would obtain low-dose CT scan 08/2017.   . Stroke (cerebrum) ([name]) Lg L MCA infarct w/ hemorrhagic conversion, embolic d/t AF 0/93/8182  . Stroke ([place])   . Trochanteric bursitis, right hip 11/14/2018  . Visit for monitoring Tikosyn therapy 03/26/2017   Social History   Socioeconomic History  . Marital status: Single    Spouse name: Not on file  . Number of children: Not on file  . Years of education: 35 (some college)  . Highest education level: Not on file  Occupational History  . Occupation: disability  Tobacco Use  . Smoking status: Former Smoker    Packs/day:  0.50    Types: Cigarettes  . Smokeless tobacco: Never Used  . Tobacco comment: a pack last three days  Substance and Sexual Activity  . Alcohol use: Not Currently    Alcohol/week: 3.0 standard drinks    Types: 3 Cans of beer per week    Comment: pt stop drinking   . Drug use: Not Currently    Frequency: 2.0 times per week    Types: Marijuana    Comment: stop smoking   . Sexual activity: Yes    Partners: Female    Birth control/protection: Condom  Other Topics Concern  . Not on file  Social History Narrative  . Not on file   Social Determinants of Health   Financial Resource  Strain:   . Difficulty of Paying Living Expenses:   Food Insecurity:   . Worried About Charity fundraiser in the Last Year:   . Arboriculturist in the Last Year:   Transportation Needs:   . Film/video editor (Medical):   Marland Kitchen Lack of Transportation (Non-Medical):   Physical Activity:   . Days of Exercise per Week:   . Minutes of Exercise per Session:   Stress:   . Feeling of Stress :   Social Connections:   . Frequency of Communication with Friends and Family:   . Frequency of Social Gatherings with Friends and Family:   . Attends Religious Services:   . Active Member of Clubs or Organizations:   . Attends Archivist Meetings:   Marland Kitchen Marital Status:    Family History  Problem Relation Age of Onset  . High blood pressure Mother   . High blood pressure Father   . Stroke Maternal Aunt   . Heart disease Neg Hx    Scheduled Meds: . atorvastatin  20 mg Oral Daily  . Chlorhexidine Gluconate Cloth  6 each Topical Daily  . digoxin  0.125 mg Oral Daily  . docusate sodium  100 mg Oral BID  . feeding supplement  1 Container Oral TID BM  . metoCLOPramide  5 mg Oral TID  . pantoprazole  40 mg Oral Daily  . polyethylene glycol  17 g Oral Daily  . ranolazine  500 mg Oral BID  . sertraline  25 mg Oral Daily  . sodium chloride flush  3 mL Intravenous Q12H   Continuous Infusions: . sodium  chloride 10 mL/hr at 07/08/19 0800  . amiodarone 60 mg/hr (07/08/19 0800)  . heparin 1,000 Units/hr (07/08/19 0800)  . milrinone 0.2 mcg/kg/min (07/08/19 0800)  . norepinephrine (LEVOPHED) Adult infusion Stopped (07/01/19 0656)   PRN Meds:.acetaminophen **OR** acetaminophen, ALPRAZolam, calcium carbonate, levalbuterol, lidocaine (PF), ondansetron **OR** ondansetron (ZOFRAN) IV, sodium chloride flush, sorbitol, traMADol, traZODone Allergies  Allergen Reactions  . Benadryl [Diphenhydramine] Palpitations   Review of Systems  Constitutional: Positive for activity change and fatigue.  Respiratory: Negative for shortness of breath.   Neurological: Positive for weakness.    Physical Exam Vitals and nursing note reviewed.  Constitutional:      General: He is not in acute distress.    Appearance: He is ill-appearing.  Cardiovascular:     Rate and Rhythm: Normal rate.  Pulmonary:     Effort: No tachypnea, accessory muscle usage or respiratory distress.  Abdominal:     General: Abdomen is flat.  Neurological:     Mental Status: He is alert and oriented to person, place, and time.     Comments: Poor understanding of severity of illness;He was looking on cell phone throughout our entire conversation but was able to respond and answer my questions appropriately and able to repeat the information I provide     Vital Signs: BP 132/71 (BP Location: Right Arm)   Pulse 85   Temp 98.5 F (36.9 C) (Oral)   Resp 16   Ht 5' 7"  (1.702 m)   Wt 73.6 kg   SpO2 100%   BMI 25.41 kg/m  Pain Scale: 0-10 POSS *See Group Information*: 1-Acceptable,Awake and alert Pain Score: 0-No pain   SpO2: SpO2: 100 % O2 Device:SpO2: 100 % O2 Flow Rate: .O2 Flow Rate (L/min): 1 L/min  IO: Intake/output summary:   Intake/Output Summary (Last 24 hours) at 07/08/2019 0920 Last data filed at  07/08/2019 0800 Gross per 24 hour  Intake 2008.73 ml  Output 1450 ml  Net 558.73 ml    LBM: Last BM Date:  07/05/19 Baseline Weight: Weight: 79.4 kg Most recent weight: Weight: 73.6 kg     Palliative Assessment/Data:     Time In: 0940 Time Out: 1030 Time Total: 50 min Greater than 50%  of this time was spent counseling and coordinating care related to the above assessment and plan.  Signed by: Vinie Sill, NP Palliative Medicine Team Pager # [phone] (M-F 8a-5p) Team Phone # [phone] (Nights/Weekends)

## 2019-07-08 NOTE — NC FL2 (Addendum)
[place] LEVEL OF CARE SCREENING TOOL     IDENTIFICATION  Patient Name: Glenn [name] Birthdate: [birth year] Sex: male Admission Date (Current Location): 06/23/2019  [facility] and [place] Number:  Herbalist and Address:  [facility] [place], [employer] [address], [place], [place] [address]      Provider Number: [id number]  Attending Physician Name and Address:  Jolaine Artist, MD  Relative Name and Phone Number:       Current Level of Care: Hospital Recommended Level of Care: [place] Prior Approval Number:    Date Approved/Denied:   PASRR Number:    Discharge Plan: Home    Current Diagnoses: Patient Active Problem List   Diagnosis Date Noted  . Atrial fibrillation with RVR ([place]) 06/23/2019  . Chronic systolic heart failure ([place]) 06/21/2019  . Persistent atrial fibrillation with RVR ([place])   . Thrombus of left atrial appendage   . Left leg pain 03/27/2019  . History of hemorrhagic stroke with residual hemiparesis ([place]) 02/04/2019  . Left leg DVT ([place]) 02/04/2019  . Cholecystitis 02/04/2019  . Acute deep vein thrombosis (DVT) of both lower extremities ([name])   . Acute blood loss anemia   . Labile blood glucose   . AKI (acute kidney injury) ([place])   . Chronic right hip pain   . Acute renal failure ([place])   . Headache due to intracranial disease 11/14/2018  . Trochanteric bursitis, right hip 11/14/2018  . Cerebral edema ([place]) 11/13/2018  . Hyperlipidemia LDL goal <70 11/13/2018  . Marijuana user 11/13/2018  . Right middle cerebral artery stroke ([place]) 11/13/2018  . Acute CVA (cerebrovascular accident) ([place])   . Noncompliance   . Dysphagia, post-stroke   . Acute on chronic combined systolic and diastolic CHF (congestive heart failure) ([place])   . DCM (dilated cardiomyopathy) ([place])   . Atrial fibrillation ([place]) 11/04/2018  . Elevated troponin   . Acute respiratory failure with hypoxia ([place])   . Acute kidney  injury ([place])   . Entrapment of right ulnar nerve 02/28/2018  . Carpal tunnel syndrome of right wrist 02/28/2018  . Impotence due to erectile dysfunction 09/30/2017  . Solitary pulmonary nodule 06/10/2017  . Neck pain 04/06/2017  . Paroxysmal atrial fibrillation ([name])   . ICD (implantable cardioverter-defibrillator) in place 09/13/2016  . Chest pain 09/13/2016  . Tobacco abuse 09/13/2016  . Hospital discharge follow-up 09/13/2016  . Upper back pain 09/13/2016  . Housing problems 09/13/2016  . Ischemic cardiomyopathy   . Cerebral infarction ([place])   . Stroke (cerebrum) ([name]) Lg L MCA infarct w/ hemorrhagic conversion, embolic d/t AF 50/10/3816  . HTN (hypertension) 08/14/2016  . Chronic Hepatitis C  08/14/2016    Orientation RESPIRATION BLADDER Height & Weight     Self, Time, Situation, Place  Normal Continent, External catheter Weight: 162 lb 4.1 oz (73.6 kg) Height:  5\' 7"  (170.2 cm)  BEHAVIORAL SYMPTOMS/MOOD NEUROLOGICAL BOWEL NUTRITION STATUS      Continent Diet(see discharge summary)  AMBULATORY STATUS COMMUNICATION OF NEEDS Skin   Independent Verbally Normal                       Personal Care Assistance Level of Assistance  Bathing, Feeding, Dressing Bathing Assistance: Independent Needs assistance with meal preparation. Feeding assistance: Independent Dressing Assistance: Independent     Functional Limitations Info  Sight, Hearing, Speech Sight Info: Adequate Hearing Info: Adequate Speech Info: Adequate    SPECIAL CARE FACTORS  FREQUENCY                       Contractures Contractures Info: Not present    Additional Factors Info  Code Status, Allergies Code Status Info: Full Allergies Info: Benadryl (Diphenhydramine)           Current Medications (07/08/2019):  This is the current hospital active medication list Current Facility-Administered Medications  Medication Dose Route Frequency Provider Last Rate Last Admin  . 0.9 %  sodium chloride  infusion   Intravenous Continuous Bensimhon, Shaune Pascal, MD 10 mL/hr at 07/08/19 1300 Rate Verify at 07/08/19 1300  . acetaminophen (TYLENOL) tablet 650 mg  650 mg Oral Q6H PRN Bensimhon, Shaune Pascal, MD   650 mg at 07/08/19 1455   Or  . acetaminophen (TYLENOL) suppository 650 mg  650 mg Rectal Q6H PRN Bensimhon, Shaune Pascal, MD      . ALPRAZolam Duanne Moron) tablet 0.25 mg  0.25 mg Oral BID PRN Bensimhon, Shaune Pascal, MD   0.25 mg at 07/08/19 1456  . amiodarone (PACERONE) tablet 200 mg  200 mg Oral Q8H [name], MD   200 mg at 07/08/19 1232  . apixaban (ELIQUIS) tablet 5 mg  5 mg Oral BID Bensimhon, Shaune Pascal, MD   5 mg at 07/08/19 1512  . atorvastatin (LIPITOR) tablet 20 mg  20 mg Oral Daily Bensimhon, Shaune Pascal, MD   20 mg at 07/08/19 0933  . calcium carbonate (TUMS - dosed in mg elemental calcium) chewable tablet 200 mg of elemental calcium  1 tablet Oral Daily PRN Bensimhon, Shaune Pascal, MD   200 mg of elemental calcium at 07/05/19 2128  . Chlorhexidine Gluconate Cloth 2 % PADS 6 each  6 each Topical Daily Bensimhon, Shaune Pascal, MD   6 each at 07/08/19 [phone]  . digoxin (LANOXIN) tablet 0.125 mg  0.125 mg Oral Daily Larey Dresser, MD   0.125 mg at 07/08/19 0933  . docusate sodium (COLACE) capsule 100 mg  100 mg Oral BID Clegg, Amy D, NP   100 mg at 07/08/19 0932  . feeding supplement (BOOST / RESOURCE BREEZE) liquid 1 Container  1 Container Oral TID BM Bensimhon, Shaune Pascal, MD   1 Container at 07/08/19 [phone]  . levalbuterol (XOPENEX) nebulizer solution 0.63 mg  0.63 mg Nebulization Q6H PRN Bensimhon, Shaune Pascal, MD      . lidocaine (PF) (XYLOCAINE) 1 % injection    PRN Arne Cleveland, MD   5 mL at 06/24/19 0955  . metoCLOPramide (REGLAN) tablet 5 mg  5 mg Oral TID Lyda Jester M, PA-C   5 mg at 07/08/19 1512  . milrinone (PRIMACOR) 20 MG/100 ML (0.2 mg/mL) infusion  0.1 mcg/kg/min Intravenous Continuous Bensimhon, Shaune Pascal, MD 2.24 mL/hr at 07/08/19 1518 0.1 mcg/kg/min at 07/08/19 1518  . norepinephrine  (LEVOPHED) 4mg  in 214mL premix infusion  0-10 mcg/min Intravenous Titrated Ninfa Meeker, Amy D, NP   Stopped at 07/01/19 0656  . ondansetron (ZOFRAN) tablet 4 mg  4 mg Oral Q6H PRN Bensimhon, Shaune Pascal, MD       Or  . ondansetron [facility]) injection 4 mg  4 mg Intravenous Q6H PRN Bensimhon, Shaune Pascal, MD   4 mg at 07/04/19 1005  . pantoprazole (PROTONIX) EC tablet 40 mg  40 mg Oral Daily Bensimhon, Shaune Pascal, MD   40 mg at 07/08/19 0934  . polyethylene glycol (MIRALAX / GLYCOLAX) packet 17 g  17 g Oral Daily Lyda Jester M, PA-C  17 g at 07/07/19 1020  . ranolazine (RANEXA) 12 hr tablet 500 mg  500 mg Oral BID Larey Dresser, MD   500 mg at 07/08/19 0933  . sertraline (ZOLOFT) tablet 25 mg  25 mg Oral Daily [name], MD   25 mg at 07/08/19 0933  . sodium chloride flush (NS) 0.9 % injection 10-40 mL  10-40 mL Intracatheter PRN Bensimhon, Shaune Pascal, MD   10 mL at 07/07/19 1019  . sodium chloride flush (NS) 0.9 % injection 3 mL  3 mL Intravenous Q12H Clegg, Amy D, NP   3 mL at 07/08/19 0934  . sorbitol 70 % solution 30 mL  30 mL Oral Daily PRN Larey Dresser, MD   30 mL at 07/04/19 1010  . traMADol (ULTRAM) tablet 50 mg  50 mg Oral Q8H PRN Bensimhon, Shaune Pascal, MD   50 mg at 07/06/19 0522  . traZODone (DESYREL) tablet 50 mg  50 mg Oral QHS PRN Clegg, Amy D, NP   50 mg at 07/06/19 0137     Discharge Medications: Please see discharge summary for a list of discharge medications.  Relevant Imaging Results:  Relevant Lab Results:   Additional Information SSN [id number] Needs assistance with medication management.  [place], LCSWA

## 2019-07-08 NOTE — Progress Notes (Signed)
Outpatient CSW will be following pt after DC to assist with housing concerns and help with medical follow up alongside [employer].  Pt is interested in going to ALF so CSW requested inpatient TOC case manager complete Fl2 so I can assist in finding LTC placement  [facility] and [facility] are only facilities who will take with straight Medicaid at this time.  [facility] is full but CSW put pt on waitlist- currently #4.  [facility] has availability for [employer]- CSW sent referral in for review  Jorge Ny, [facility] Desk#: [phone] Cell#: [phone]

## 2019-07-08 NOTE — Consult Note (Signed)
Responded to consult request for information on Advanced Directive. Pt would like to name cousin HCPOA. Explained and left form with pt. He understood form. Pt is [place] and appreciated prayer. Would likely appreciate continued chaplain support.  Rev. Eloise Levels Chaplain

## 2019-07-08 NOTE — Progress Notes (Signed)
[place] for Heparin > change to Eliquis Indication: atrial fibrillation  Allergies  Allergen Reactions  . Benadryl [Diphenhydramine] Palpitations    Patient Measurements: Height: 5\' 7"  (170.2 cm) Weight: 73.6 kg (162 lb 4.1 oz) IBW/kg (Calculated) : 66.1 Heparin Dosing Weight: 76.2 kg  Vital Signs: Temp: 98.7 F (37.1 C) (05/19 1045) Temp Source: Oral (05/19 1045) BP: 115/60 (05/19 1228) Pulse Rate: 79 (05/19 1440)  Labs: Recent Labs    07/06/19 0340 07/07/19 0418 07/08/19 0428 07/08/19 0734 07/08/19 1412  HGB  --   --   --  9.7*  --   HCT  --   --   --  30.4*  --   PLT  --   --   --  227  --   HEPARINUNFRC 0.60 0.24* 0.30  --   --   CREATININE 2.04* 1.99*  --   --  1.82*    Estimated Creatinine Clearance: 36.8 mL/min (A) (by C-G formula based on SCr of 1.82 mg/dL (H)).   Medical History: Past Medical History:  Diagnosis Date  . Acute blood loss anemia   . Acute CVA (cerebrovascular accident) ([place])   . Acute deep vein thrombosis (DVT) of both lower extremities ([name])   . Acute kidney injury ([place])   . Acute on chronic combined systolic and diastolic CHF (congestive heart failure) ([place])   . Acute renal failure superimposed on stage 3a chronic kidney disease ([place])   . Acute respiratory failure with hypoxia ([place])   . Atrial fibrillation ([place])   . Carpal tunnel syndrome of right wrist 02/28/2018  . Cerebral edema ([place]) 11/13/2018  . Cerebral infarction ([place])   . CHF (congestive heart failure) ([place])   . Cholecystitis 02/04/2019  . Chronic right hip pain   . DCM (dilated cardiomyopathy) ([place])   . Dysphagia, post-stroke   . Elevated troponin   . Entrapment of right ulnar nerve 02/28/2018  . Headache due to intracranial disease 11/14/2018  . Hepatitis C   . History of hemorrhagic stroke with residual hemiparesis ([place]) 02/04/2019  . HTN (hypertension) 08/14/2016  . Hyperlipidemia LDL goal <70 11/13/2018  . Hypertension   . ICD  (implantable cardioverter-defibrillator) in place 09/13/2016  . Impotence due to erectile dysfunction 09/30/2017  . Ischemic cardiomyopathy   . Labile blood glucose   . Left leg DVT ([place]) 02/04/2019  . Marijuana user 11/13/2018  . Paroxysmal atrial fibrillation ([name])   . Right middle cerebral artery stroke ([place]) 11/13/2018  . Solitary pulmonary nodule 06/10/2017   5 mm RUL nodule noted incidentally as part of CVA workup 08/2016. With smoking history would obtain low-dose CT scan 08/2017.   . Stroke (cerebrum) ([name]) Lg L MCA infarct w/ hemorrhagic conversion, embolic d/t AF 2/94/7654  . Stroke ([place])   . Trochanteric bursitis, right hip 11/14/2018  . Visit for monitoring Tikosyn therapy 03/26/2017    Assessment: Pt is a 69 yo M with atrial fibrillation on dabigatran PTA. Pt reports last dose was the morning of 06/23/19.   Heparin for IABP placement 5/7 and AF.  IABP out 5/15.  Heparin level at goal this AM.  No bleeding or complications noted.    Previously taking [name], however, now also on Ranexa with interacts with [name] (increased risk of bleeding).  Goal of Therapy:  Heparin level 0.3-0.5 (now off IABP) Monitor platelets by anticoagulation protocol: Yes   Plan:  Stop IV heparin Give Eliquis 5 mg BID.   [place] Crane, Pharm D,  BCPS, [facility] Clinical Pharmacist Phone [phone]  07/08/2019 3:04 PM

## 2019-07-08 NOTE — Progress Notes (Addendum)
[facility] for Heparin Indication: atrial fibrillation  Allergies  Allergen Reactions  . Benadryl [Diphenhydramine] Palpitations    Patient Measurements: Height: 5\' 7"  (170.2 cm) Weight: 73.6 kg (162 lb 4.1 oz) IBW/kg (Calculated) : 66.1 Heparin Dosing Weight: 76.2 kg  Vital Signs: Temp: 98.5 F (36.9 C) (05/19 0737) Temp Source: Oral (05/19 0737) BP: 132/71 (05/19 0600) Pulse Rate: 80 (05/19 0932)  Labs: Recent Labs    07/06/19 0340 07/07/19 0418 07/08/19 0428 07/08/19 0734  HGB  --   --   --  9.7*  HCT  --   --   --  30.4*  PLT  --   --   --  227  HEPARINUNFRC 0.60 0.24* 0.30  --   CREATININE 2.04* 1.99*  --   --     Estimated Creatinine Clearance: 33.7 mL/min (A) (by C-G formula based on SCr of 1.99 mg/dL (H)).   Medical History: Past Medical History:  Diagnosis Date  . Acute blood loss anemia   . Acute CVA (cerebrovascular accident) ([place])   . Acute deep vein thrombosis (DVT) of both lower extremities ([name])   . Acute kidney injury ([place])   . Acute on chronic combined systolic and diastolic CHF (congestive heart failure) ([place])   . Acute renal failure superimposed on stage 3a chronic kidney disease ([place])   . Acute respiratory failure with hypoxia ([place])   . Atrial fibrillation ([place])   . Carpal tunnel syndrome of right wrist 02/28/2018  . Cerebral edema ([place]) 11/13/2018  . Cerebral infarction ([place])   . CHF (congestive heart failure) ([place])   . Cholecystitis 02/04/2019  . Chronic right hip pain   . DCM (dilated cardiomyopathy) ([place])   . Dysphagia, post-stroke   . Elevated troponin   . Entrapment of right ulnar nerve 02/28/2018  . Headache due to intracranial disease 11/14/2018  . Hepatitis C   . History of hemorrhagic stroke with residual hemiparesis ([place]) 02/04/2019  . HTN (hypertension) 08/14/2016  . Hyperlipidemia LDL goal <70 11/13/2018  . Hypertension   . ICD (implantable cardioverter-defibrillator) in place 09/13/2016   . Impotence due to erectile dysfunction 09/30/2017  . Ischemic cardiomyopathy   . Labile blood glucose   . Left leg DVT ([place]) 02/04/2019  . Marijuana user 11/13/2018  . Paroxysmal atrial fibrillation ([name])   . Right middle cerebral artery stroke ([place]) 11/13/2018  . Solitary pulmonary nodule 06/10/2017   5 mm RUL nodule noted incidentally as part of CVA workup 08/2016. With smoking history would obtain low-dose CT scan 08/2017.   . Stroke (cerebrum) ([name]) Lg L MCA infarct w/ hemorrhagic conversion, embolic d/t AF 10/18/5619  . Stroke ([place])   . Trochanteric bursitis, right hip 11/14/2018  . Visit for monitoring Tikosyn therapy 03/26/2017    Assessment: Pt is a 69 yo M with atrial fibrillation on dabigatran PTA. Pt reports last dose was the morning of 06/23/19.   Heparin for IABP placement 5/7 and AF - IABP removed 5/14. Heparin level at goal this morning at 0.3 with rate of 1000 units/h - aiming for lower end of range. Hemoglobin is down to 9.7 this morning compared to CBC on 5/16 with hemoglobin of 11.9. Platelet count is WNL. RN states no bleeding or infusion issues at this time. Heparin is infusing through right midline and levels are being drawn from right IJ.   Since patient recently had supratherapeutic at heparin IV infusion rate of 1050 units/hr, we will continue the 1000 units/hr and  check another level tomorrow morning.   Goal of Therapy:  Heparin level 0.3-0.5 (now off IABP) Monitor platelets by anticoagulation protocol: Yes   Plan:  - Continue heparin IV infusion at 1000 units/hr - Monitor daily HL, CBC - Monitor for s/sx of bleeding  Agnes Lawrence, PharmD PGY1 Pharmacy Resident

## 2019-07-08 NOTE — Progress Notes (Addendum)
Electrophysiology Rounding Note  Patient Name: Glenn [name] Date of Encounter: 07/08/2019  Primary Cardiologist: Virl Axe, MD AHF: Dr. Haroldine Laws    Subjective  Holding sinus Walked and sat yday Still on IV amio But nausea is better    Inpatient Medications    Scheduled Meds: . atorvastatin  20 mg Oral Daily  . Chlorhexidine Gluconate Cloth  6 each Topical Daily  . digoxin  0.125 mg Oral Daily  . docusate sodium  100 mg Oral BID  . feeding supplement  1 Container Oral TID BM  . metoCLOPramide  5 mg Oral TID  . pantoprazole  40 mg Oral Daily  . polyethylene glycol  17 g Oral Daily  . ranolazine  500 mg Oral BID  . sodium chloride flush  3 mL Intravenous Q12H   Continuous Infusions: . sodium chloride 10 mL/hr at 07/08/19 0800  . amiodarone 60 mg/hr (07/08/19 0800)  . heparin 1,000 Units/hr (07/08/19 0800)  . milrinone 0.2 mcg/kg/min (07/08/19 0800)  . norepinephrine (LEVOPHED) Adult infusion Stopped (07/01/19 0656)   PRN Meds: acetaminophen **OR** acetaminophen, ALPRAZolam, calcium carbonate, levalbuterol, lidocaine (PF), ondansetron **OR** ondansetron (ZOFRAN) IV, sodium chloride flush, sorbitol, traMADol, traZODone   Vital Signs    Vitals:   07/08/19 0500 07/08/19 0600 07/08/19 0737 07/08/19 0800  BP:  132/71    Pulse: 77 75  79  Resp: 14 15  18   Temp:   98.5 F (36.9 C)   TempSrc:   Oral   SpO2: 99% 99%  98%  Weight: 73.6 kg     Height:        Intake/Output Summary (Last 24 hours) at 07/08/2019 0901 Last data filed at 07/08/2019 0800 Gross per 24 hour  Intake 2008.73 ml  Output 1450 ml  Net 558.73 ml   Filed Weights   07/06/19 0500 07/07/19 0500 07/08/19 0500  Weight: 70.4 kg 72.2 kg 73.6 kg    Physical Exam    Well developed and nourished in no acute distress HENT normal Neck supple  Clear Regular rate and rhythm, no murmurs or gallops Abd-soft with active BS No Clubbing cyanosis edema Skin-warm and dry A & Oriented  Grossly  normal sensory and motor function      Labs    CBC Recent Labs    07/08/19 0734  WBC 4.9  HGB 9.7*  HCT 30.4*  MCV 92.1  PLT 443   Basic Metabolic Panel Recent Labs    07/06/19 0340 07/06/19 0340 07/07/19 0418 07/08/19 0428  NA 132*  --  132*  --   K 4.1  --  4.1  --   CL 96*  --  94*  --   CO2 28  --  28  --   GLUCOSE 125*  --  142*  --   BUN 14  --  14  --   CREATININE 2.04*  --  1.99*  --   CALCIUM 9.1  --  8.7*  --   MG 1.6*   < > 2.0 1.7   < > = values in this interval not displayed.   Liver Function Tests No results for input(s): AST, ALT, ALKPHOS, BILITOT, PROT, ALBUMIN in the last 72 hours. No results for input(s): LIPASE, AMYLASE in the last 72 hours. Cardiac Enzymes No results for input(s): CKTOTAL, CKMB, CKMBINDEX, TROPONINI in the last 72 hours.   Telemetry    Telemetry Personally sinus  Radiology    No results found.  Patient Profile  Glenn Takahashi Dejournetteis a 69 y.o.malewith a past medical history significant for medical non-compliance, chronic systolic CHF, CVAs with hemorrhagic conversion, AF with RVR.hewas admitted for Acute on chronic systolic CHF with cardiogenic shock in the setting of AF with RVR.    Assessment & Plan    AF with RVR  NICM    Acute on chronic systolic CHF, cardiogenic shock -improved now  on Milrinone   Kidney injury    Anxiety  ICD -BJ's sinus will transition amio to PO from IV now that nausea has abated;  iwll try 200 q 8 to try and avoid nausea   Will begin sertraline and will need to track QT interval  Renal function better   Need to check dig level on amio and renal issues and nausea

## 2019-07-08 NOTE — Progress Notes (Signed)
Patient ID: Glenn [name], male   DOB: 10/31/1950, 69 y.o.   MRN: [id number]    Advanced Heart Failure Rounding Note   Subjective:    06/25/19 IABP placed 06/30/19  TEE/DC-CV with conversion to NSR but went back in A fib.  07/02/19 Had repeat DCCV.  07/03/19 IABP removed 5/17 back in AF 5/18 back in NSR   Remains on IV amiodarone and 0.2 of milrinone. Remains in NSR CVP 11-12. Co-ox 61% Weight up 3 pounds  SCr trending back down, 2.04>>1.99. -> 1.82  Feels ok. Denies SOB, orthopnea or PND. No bleeding on heparin.    Objective:   Weight Range:  Vital Signs:   Temp:  [97.7 F (36.5 C)-98.7 F (37.1 C)] 98.7 F (37.1 C) (05/19 1045) Pulse Rate:  [73-86] 79 (05/19 1440) Resp:  [13-26] 20 (05/19 1440) BP: (110-132)/(49-72) 115/60 (05/19 1228) SpO2:  [96 %-100 %] 100 % (05/19 1440) Weight:  [73.6 kg] 73.6 kg (05/19 0500) Last BM Date: 07/05/19  Weight change: Filed Weights   07/06/19 0500 07/07/19 0500 07/08/19 0500  Weight: 70.4 kg 72.2 kg 73.6 kg    Intake/Output:   Intake/Output Summary (Last 24 hours) at 07/08/2019 1458 Last data filed at 07/08/2019 1300 Gross per 24 hour  Intake 1557 ml  Output 1460 ml  Net 97 ml     Physical Exam: CVP 12 General:  Well appearing. No resp difficulty HEENT: normal Neck: supple. JVP to jaw Carotids 2+ bilat; no bruits. No lymphadenopathy or thryomegaly appreciated. Cor: PMI nondisplaced. Regular rate & rhythm. No rubs, gallops or murmurs. Lungs: clear Abdomen: soft, nontender, nondistended. No hepatosplenomegaly. No bruits or masses. Good bowel sounds. + biliary drain Extremities: no cyanosis, clubbing, rash, edema Neuro: alert & orientedx3, cranial nerves grossly intact. moves all 4 extremities w/o difficulty. Affect pleasant    Telemetry:  NSR 70-80s Personally reviewed  Labs: Basic Metabolic Panel: Recent Labs  Lab 07/03/19 0409 07/03/19 0409 07/04/19 0318 07/04/19 0318 07/05/19 0411 07/05/19 0411 07/06/19  0340 07/07/19 0418 07/08/19 0428 07/08/19 1412  NA 132*   < > 131*  --  132*  --  132* 132*  --  130*  K 3.6   < > 3.8  --  3.8  --  4.1 4.1  --  4.0  CL 88*   < > 92*  --  92*  --  96* 94*  --  93*  CO2 31   < > 31  --  29  --  28 28  --  28  GLUCOSE 113*   < > 123*  --  135*  --  125* 142*  --  136*  BUN 17   < > 14  --  11  --  14 14  --  19  CREATININE 2.07*   < > 1.94*  --  1.85*  --  2.04* 1.99*  --  1.82*  CALCIUM 9.0   < > 9.1   < > 9.1   < > 9.1 8.7*  --  8.6*  MG 2.0  --   --   --  1.8  --  1.6* 2.0 1.7  --    < > = values in this interval not displayed.    Liver Function Tests: Recent Labs  Lab 07/03/19 0409  AST 16  ALT 13  ALKPHOS 65  BILITOT 1.0  PROT 7.0  ALBUMIN 3.4*   No results for input(s): LIPASE, AMYLASE in the last 168 hours. No results for  input(s): AMMONIA in the last 168 hours.  CBC: Recent Labs  Lab 07/02/19 0337 07/02/19 1929 07/02/19 1939 07/03/19 0409 07/04/19 0318 07/05/19 0411 07/08/19 0734  WBC 6.4  --   --  5.4 5.9 5.9 4.9  HGB 12.8*   < > 13.3 11.9* 12.2* 11.9* 9.7*  HCT 38.9*   < > 39.0 36.4* 37.4* 35.8* 30.4*  MCV 89.8  --   --  89.2 89.0 90.2 92.1  PLT 159  --   --  151 184 199 227   < > = values in this interval not displayed.    Cardiac Enzymes: No results for input(s): CKTOTAL, CKMB, CKMBINDEX, TROPONINI in the last 168 hours.  BNP: BNP (last 3 results) Recent Labs    02/03/19 2007 06/01/19 1413 06/23/19 1527  BNP 922.2* 531.4* 613.2*    ProBNP (last 3 results) No results for input(s): PROBNP in the last 8760 hours.    Other results:  Imaging: No results found.   Medications:     Scheduled Medications: . amiodarone  200 mg Oral Q8H  . apixaban  5 mg Oral BID  . atorvastatin  20 mg Oral Daily  . Chlorhexidine Gluconate Cloth  6 each Topical Daily  . digoxin  0.125 mg Oral Daily  . docusate sodium  100 mg Oral BID  . feeding supplement  1 Container Oral TID BM  . metoCLOPramide  5 mg Oral TID  .  pantoprazole  40 mg Oral Daily  . polyethylene glycol  17 g Oral Daily  . ranolazine  500 mg Oral BID  . sertraline  25 mg Oral Daily  . sodium chloride flush  3 mL Intravenous Q12H    Infusions: . sodium chloride 10 mL/hr at 07/08/19 1300  . milrinone 0.2 mcg/kg/min (07/08/19 1300)  . norepinephrine (LEVOPHED) Adult infusion Stopped (07/01/19 0656)    PRN Medications: acetaminophen **OR** acetaminophen, ALPRAZolam, calcium carbonate, levalbuterol, lidocaine (PF), ondansetron **OR** ondansetron (ZOFRAN) IV, sodium chloride flush, sorbitol, traMADol, traZODone   Assessment/Plan:   1. Acute on Chronic Combined Systolic and Diastolic Heart Failure -> cardiogenic shock - 2/2 NICM. HF dates back to 2015.  - Most recent echo 4/21 w/ EF <20. RV mildly reduced. GIIIDD. Moderate TR. No LVH.  - [facility] 10/2018 showed normal cors, elevated filling pressures and low CI at 1.9  - Now admitted w/ NYHA IV symptoms, volume overload and worsening renal function, likely cardiorenal syndrome. SCr peaked to 2.97 (prior baseline 1.2).  - RHC 5/5 showed Low output biventricular failure and marked volume overload. PCWP 27. CI 2.0. - IABP placed 5/7 on 1:1 with good augmentation, removed 5/14.  - Co-ox 60% on 0.2 of milrinone.  - Reduce milrinone to 0.1  today and follow co-ox - volume status up slightly, CVP ~12. Wt trending up - Give dose of IV Lasix 80 mg x 1 today - Poor LVAD candidate with RV dysfunction, noncompliance and renal failure.  - Limited options.   2. AKI - due to shock/cardiorenal - Creatinine peaked at 3 - SCr trending back down, 2.38>>2.25>>2.07>>1.94>>1.85>>2.04>>1.99 > 1.8 - Monitor w/ milrinone wean and w/ diuresis     3. Persistent Atrial Fibrillation w/ RVR - Recent DCCV attempt aborted 4/20 due to LAA thrombi on TEE - S/P DC-CV 06/30/19 with brief conversion to NSR but back in A fib - S/P repeat DCCV 5/13.  - Back in AF 5/15. Rebolused w/ amiodarone - In NSR on IV amio and  Ranexa - Continue slow wean of  milrinone  - Continue amio drip 60 mg per hour.   - will avoid?blocker currently given low output and acute decompensation - Continue ranolazine 500 mg bid.  Watch QT - Continue digoxin 0.125 qd  - Switch heparin to Eliquis (can't use [name] with Ranexa per PharmD)  4. NSVT - in the setting of severe LV dysfunction and milrinone  - continue IV amiodarone  - he has ICD - keep K >4.0 and Mg >2.0  - K 4.0. Mg 1.7 today  - supp mag  5. Chronic Cholecystitis  - has perc biliary drain - appreciate IR input  6. Hypokalemia - Resolved. K 4.1 today  - monitor w/ diuresis   7. Persistent Nausea and Vomiting/Constipation  - improved    Length of Stay: 15  Glori Bickers, MD  2:58 PM

## 2019-07-08 NOTE — Plan of Care (Signed)
  Problem: Education: Goal: Knowledge of General Education information will improve Description: Including pain rating scale, medication(s)/side effects and non-pharmacologic comfort measures Outcome: Progressing   Problem: Health Behavior/Discharge Planning: Goal: Ability to manage health-related needs will improve Outcome: Progressing   Problem: Clinical Measurements: Goal: Ability to maintain clinical measurements within normal limits will improve Outcome: Progressing Goal: Will remain free from infection Outcome: Progressing Goal: Diagnostic test results will improve Outcome: Progressing Goal: Respiratory complications will improve Outcome: Progressing Goal: Cardiovascular complication will be avoided Outcome: Progressing   Problem: Activity: Goal: Risk for activity intolerance will decrease Outcome: Progressing   Problem: Nutrition: Goal: Adequate nutrition will be maintained Outcome: Progressing   Problem: Coping: Goal: Level of anxiety will decrease Outcome: Progressing   Problem: Elimination: Goal: Will not experience complications related to bowel motility Outcome: Progressing Goal: Will not experience complications related to urinary retention Outcome: Progressing   Problem: Pain Managment: Goal: General experience of comfort will improve Outcome: Progressing   Problem: Safety: Goal: Ability to remain free from injury will improve Outcome: Progressing   Problem: Skin Integrity: Goal: Risk for impaired skin integrity will decrease Outcome: Progressing   Problem: Cardiovascular: Goal: Ability to achieve and maintain adequate cardiovascular perfusion will improve Outcome: Progressing Goal: Vascular access site(s) Level 0-1 will be maintained Outcome: Progressing   Problem: Cardiac: Goal: Ability to achieve and maintain adequate cardiopulmonary perfusion will improve Outcome: Progressing Goal: Vascular access site(s) Level 0-1 will be  maintained Outcome: Progressing   Problem: Fluid Volume: Goal: Ability to achieve a balanced intake and output will improve Outcome: Progressing   Problem: Physical Regulation: Goal: Complications related to the disease process, condition or treatment will be avoided or minimized Outcome: Progressing   Problem: Respiratory: Goal: Will regain and/or maintain adequate ventilation Outcome: Progressing

## 2019-07-09 LAB — BASIC METABOLIC PANEL
Anion gap: 9 (ref 5–15)
BUN: 25 mg/dL — ABNORMAL HIGH (ref 8–23)
CO2: 30 mmol/L (ref 22–32)
Calcium: 8.9 mg/dL (ref 8.9–10.3)
Chloride: 92 mmol/L — ABNORMAL LOW (ref 98–111)
Creatinine, Ser: 1.95 mg/dL — ABNORMAL HIGH (ref 0.61–1.24)
GFR calc Af Amer: 40 mL/min — ABNORMAL LOW (ref >60)
GFR calc non Af Amer: 35 mL/min — ABNORMAL LOW (ref >60)
Glucose, Bld: 125 mg/dL — ABNORMAL HIGH (ref 70–99)
Potassium: 5.1 mmol/L (ref 3.5–5.1)
Sodium: 131 mmol/L — ABNORMAL LOW (ref 135–145)

## 2019-07-09 LAB — COOXEMETRY PANEL
Carboxyhemoglobin: 1.9 % — ABNORMAL HIGH (ref 0.5–1.5)
Methemoglobin: 0.4 % (ref 0.0–1.5)
O2 Saturation: 63.9 %
Total hemoglobin: 9.6 g/dL — ABNORMAL LOW (ref 12.0–16.0)

## 2019-07-09 LAB — MAGNESIUM: Magnesium: 2 mg/dL (ref 1.7–2.4)

## 2019-07-09 LAB — DIGOXIN LEVEL: Digoxin Level: 0.6 ng/mL — ABNORMAL LOW (ref 0.8–2.0)

## 2019-07-09 MED ORDER — FUROSEMIDE 10 MG/ML IJ SOLN
120.0000 mg | Freq: Two times a day (BID) | INTRAVENOUS | Status: AC
  Administered 2019-07-09 (×2): 120 mg via INTRAVENOUS
  Filled 2019-07-09 (×2): qty 12

## 2019-07-09 NOTE — Progress Notes (Signed)
Palliative:  I attempted to call Glenn [name]'s cousin Glenn [name] as requested but no answer and no option to leave voicemail. I went to visit again with Glenn [name] and he is walking around unit. I told him that I was unable to reach Glenn [name] but will try again Monday. Discussed Living Will and he says his son took this home as he thought they would have to go to the bank to get notarized. I educated that we can help him notarize this here and will try to get this done tomorrow for him.   No charge  Glenn Sill, NP Palliative Medicine Team Pager [phone] (Please see [email or web address] for schedule) Team Phone [phone]    Greater than 50%  of this time was spent counseling and coordinating care related to the above assessment and plan

## 2019-07-09 NOTE — Plan of Care (Signed)
  Problem: Education: Goal: Knowledge of General Education information will improve Description: Including pain rating scale, medication(s)/side effects and non-pharmacologic comfort measures Outcome: Progressing   Problem: Health Behavior/Discharge Planning: Goal: Ability to manage health-related needs will improve Outcome: Progressing   Problem: Clinical Measurements: Goal: Ability to maintain clinical measurements within normal limits will improve Outcome: Progressing Goal: Will remain free from infection Outcome: Progressing Goal: Diagnostic test results will improve Outcome: Progressing Goal: Respiratory complications will improve Outcome: Progressing Goal: Cardiovascular complication will be avoided Outcome: Progressing   Problem: Activity: Goal: Risk for activity intolerance will decrease Outcome: Progressing   Problem: Nutrition: Goal: Adequate nutrition will be maintained Outcome: Progressing   Problem: Coping: Goal: Level of anxiety will decrease Outcome: Progressing   Problem: Elimination: Goal: Will not experience complications related to bowel motility Outcome: Progressing Goal: Will not experience complications related to urinary retention Outcome: Progressing   Problem: Pain Managment: Goal: General experience of comfort will improve Outcome: Progressing   Problem: Safety: Goal: Ability to remain free from injury will improve Outcome: Progressing   Problem: Skin Integrity: Goal: Risk for impaired skin integrity will decrease Outcome: Progressing   Problem: Cardiovascular: Goal: Ability to achieve and maintain adequate cardiovascular perfusion will improve Outcome: Progressing Goal: Vascular access site(s) Level 0-1 will be maintained Outcome: Progressing   Problem: Cardiac: Goal: Ability to achieve and maintain adequate cardiopulmonary perfusion will improve Outcome: Progressing Goal: Vascular access site(s) Level 0-1 will be  maintained Outcome: Progressing   Problem: Fluid Volume: Goal: Ability to achieve a balanced intake and output will improve Outcome: Progressing   Problem: Physical Regulation: Goal: Complications related to the disease process, condition or treatment will be avoided or minimized Outcome: Progressing   Problem: Respiratory: Goal: Will regain and/or maintain adequate ventilation Outcome: Progressing

## 2019-07-09 NOTE — Progress Notes (Signed)
Electrophysiology Rounding Note  Patient Name: VONTRELL PULLMAN Date of Encounter: 07/09/2019  Primary Cardiologist: Virl Axe, MD AHF: Dr. Haroldine Laws    Subjective  Feeling pretty good   Saw palllaitive care No nausea with amio  Inpatient Medications    Scheduled Meds: . amiodarone  200 mg Oral Q8H  . apixaban  5 mg Oral BID  . atorvastatin  20 mg Oral Daily  . Chlorhexidine Gluconate Cloth  6 each Topical Daily  . digoxin  0.125 mg Oral Daily  . docusate sodium  100 mg Oral BID  . feeding supplement  1 Container Oral TID BM  . metoCLOPramide  5 mg Oral TID  . pantoprazole  40 mg Oral Daily  . polyethylene glycol  17 g Oral Daily  . ranolazine  500 mg Oral BID  . sertraline  25 mg Oral Daily  . sodium chloride flush  3 mL Intravenous Q12H   Continuous Infusions: . sodium chloride 10 mL/hr at 07/09/19 1100  . furosemide 120 mg (07/09/19 1140)  . milrinone Stopped (07/09/19 0924)  . norepinephrine (LEVOPHED) Adult infusion Stopped (07/01/19 0656)   PRN Meds: acetaminophen **OR** acetaminophen, ALPRAZolam, calcium carbonate, levalbuterol, lidocaine (PF), ondansetron **OR** ondansetron (ZOFRAN) IV, sodium chloride flush, sorbitol, traMADol, traZODone   Vital Signs    Vitals:   07/09/19 0726 07/09/19 0800 07/09/19 1100 07/09/19 1119  BP:  (!) 111/51 118/64   Pulse:  78 80   Resp:  15 (!) 22   Temp: 98 F (36.7 C)   98.6 F (37 C)  TempSrc:      SpO2:  100% 100%   Weight:      Height:        Intake/Output Summary (Last 24 hours) at 07/09/2019 1215 Last data filed at 07/09/2019 1100 Gross per 24 hour  Intake 1060.7 ml  Output 2510 ml  Net -1449.3 ml   Filed Weights   07/07/19 0500 07/08/19 0500 07/09/19 0500  Weight: 72.2 kg 73.6 kg 74.5 kg    Physical Exam  BP 118/64   Pulse 80   Temp 98.6 F (37 C)   Resp (!) 22   Ht 5\' 7"  (1.702 m)   Wt 74.5 kg   SpO2 100%   BMI 25.72 kg/m  Well developed and nourished in no acute distress HENT  normal Neck supple  Clear Regular rate and rhythm, no murmurs or gallops Abd-soft with active BS No Clubbing cyanosis edema Skin-warm and dry A & Oriented  Grossly normal sensory and motor function  Dig 0.6    Labs    CBC Recent Labs    07/08/19 0734  WBC 4.9  HGB 9.7*  HCT 30.4*  MCV 92.1  PLT 259   Basic Metabolic Panel Recent Labs    07/07/19 0418 07/08/19 0428 07/08/19 1412 07/09/19 0442  NA   < >  --  130* 131*  K   < >  --  4.0 5.1  CL   < >  --  93* 92*  CO2   < >  --  28 30  GLUCOSE   < >  --  136* 125*  BUN   < >  --  19 25*  CREATININE   < >  --  1.82* 1.95*  CALCIUM   < >  --  8.6* 8.9  MG  --  1.7  --  2.0   < > = values in this interval not displayed.   Liver Function Tests No  results for input(s): AST, ALT, ALKPHOS, BILITOT, PROT, ALBUMIN in the last 72 hours. No results for input(s): LIPASE, AMYLASE in the last 72 hours. Cardiac Enzymes No results for input(s): CKTOTAL, CKMB, CKMBINDEX, TROPONINI in the last 72 hours.   Telemetry    Telemetry Personally sinus  Radiology    No results found.  Patient Profile     Naren Benally Dejournetteis a 69 y.o.malewith a past medical history significant for medical non-compliance, chronic systolic CHF, CVAs with hemorrhagic conversion, AF with RVR.hewas admitted for Acute on chronic systolic CHF with cardiogenic shock in the setting of AF with RVR.    Assessment & Plan    AF with RVR  NICM    Acute on chronic systolic CHF, cardiogenic shock -improved now  on Milrinone   Kidney injury    Anxiety  ICD -BJ's sinus rhythm.  Tolerating conversion to oral amiodarone.  Continue ranolazine.  Dig level within range.  Tolerated sertraline.

## 2019-07-09 NOTE — Progress Notes (Addendum)
Patient ID: Glenn [name], male   DOB: 1950-09-22, 69 y.o.   MRN: [id number]    Advanced Heart Failure Rounding Note   Subjective:    06/25/19 IABP placed 06/30/19  TEE/DC-CV with conversion to NSR but went back in A fib.  07/02/19 Had repeat DCCV.  07/03/19 IABP removed 5/17 back in AF 5/18 back in NSR   Co-ox stable at 64% on 0.1 mcg/kg/min. Continues in NSR. CVP 15-16. Wt trending back up. SCr 1.95.     Objective:   Weight Range:  Vital Signs:   Temp:  [97.4 F (36.3 C)-99 F (37.2 C)] 98 F (36.7 C) (05/20 0726) Pulse Rate:  [75-83] 78 (05/20 0800) Resp:  [14-26] 15 (05/20 0800) BP: (99-134)/(51-106) 111/51 (05/20 0800) SpO2:  [96 %-100 %] 100 % (05/20 0800) Weight:  [74.5 kg] 74.5 kg (05/20 0500) Last BM Date: 07/05/19  Weight change: Filed Weights   07/07/19 0500 07/08/19 0500 07/09/19 0500  Weight: 72.2 kg 73.6 kg 74.5 kg    Intake/Output:   Intake/Output Summary (Last 24 hours) at 07/09/2019 1011 Last data filed at 07/09/2019 0800 Gross per 24 hour  Intake 1063.53 ml  Output 2320 ml  Net -1256.47 ml     Physical Exam: CVP 15-16 General:  Well appearing, laying in bed. No resp difficulty HEENT: normal Neck: supple. JVP to jaw Carotids 2+ bilat; no bruits. No lymphadenopathy or thryomegaly appreciated. Cor: PMI nondisplaced. Regular rate & rhythm. No rubs, gallops or murmurs. Lungs: clear, no wheezing  Abdomen: soft, nontender, nondistended. No hepatosplenomegaly. No bruits or masses. Good bowel sounds. + biliary drain Extremities: no cyanosis, clubbing, rash, edema Neuro: alert & orientedx3, cranial nerves grossly intact. moves all 4 extremities w/o difficulty. Affect pleasant    Telemetry:  NSR 70-80s Personally reviewed  Labs: Basic Metabolic Panel: Recent Labs  Lab 07/05/19 0411 07/05/19 0411 07/06/19 0340 07/06/19 0340 07/07/19 0418 07/08/19 0428 07/08/19 1412 07/09/19 0442  NA 132*  --  132*  --  132*  --  130* 131*  K 3.8  --   4.1  --  4.1  --  4.0 5.1  CL 92*  --  96*  --  94*  --  93* 92*  CO2 29  --  28  --  28  --  28 30  GLUCOSE 135*  --  125*  --  142*  --  136* 125*  BUN 11  --  14  --  14  --  19 25*  CREATININE 1.85*  --  2.04*  --  1.99*  --  1.82* 1.95*  CALCIUM 9.1   < > 9.1   < > 8.7*  --  8.6* 8.9  MG 1.8  --  1.6*  --  2.0 1.7  --  2.0   < > = values in this interval not displayed.    Liver Function Tests: Recent Labs  Lab 07/03/19 0409  AST 16  ALT 13  ALKPHOS 65  BILITOT 1.0  PROT 7.0  ALBUMIN 3.4*   No results for input(s): LIPASE, AMYLASE in the last 168 hours. No results for input(s): AMMONIA in the last 168 hours.  CBC: Recent Labs  Lab 07/02/19 1939 07/03/19 0409 07/04/19 0318 07/05/19 0411 07/08/19 0734  WBC  --  5.4 5.9 5.9 4.9  HGB 13.3 11.9* 12.2* 11.9* 9.7*  HCT 39.0 36.4* 37.4* 35.8* 30.4*  MCV  --  89.2 89.0 90.2 92.1  PLT  --  151 184 199 227  Cardiac Enzymes: No results for input(s): CKTOTAL, CKMB, CKMBINDEX, TROPONINI in the last 168 hours.  BNP: BNP (last 3 results) Recent Labs    02/03/19 2007 06/01/19 1413 06/23/19 1527  BNP 922.2* 531.4* 613.2*    ProBNP (last 3 results) No results for input(s): PROBNP in the last 8760 hours.    Other results:  Imaging: No results found.   Medications:     Scheduled Medications: . amiodarone  200 mg Oral Q8H  . apixaban  5 mg Oral BID  . atorvastatin  20 mg Oral Daily  . Chlorhexidine Gluconate Cloth  6 each Topical Daily  . digoxin  0.125 mg Oral Daily  . docusate sodium  100 mg Oral BID  . feeding supplement  1 Container Oral TID BM  . metoCLOPramide  5 mg Oral TID  . pantoprazole  40 mg Oral Daily  . polyethylene glycol  17 g Oral Daily  . ranolazine  500 mg Oral BID  . sertraline  25 mg Oral Daily  . sodium chloride flush  3 mL Intravenous Q12H    Infusions: . sodium chloride 10 mL/hr at 07/09/19 0800  . furosemide    . milrinone 0.1 mcg/kg/min (07/09/19 0800)  . norepinephrine  (LEVOPHED) Adult infusion Stopped (07/01/19 0656)    PRN Medications: acetaminophen **OR** acetaminophen, ALPRAZolam, calcium carbonate, levalbuterol, lidocaine (PF), ondansetron **OR** ondansetron (ZOFRAN) IV, sodium chloride flush, sorbitol, traMADol, traZODone   Assessment/Plan:   1. Acute on Chronic Combined Systolic and Diastolic Heart Failure -> cardiogenic shock - 2/2 NICM. HF dates back to 2015.  - Most recent echo 4/21 w/ EF <20. RV mildly reduced. GIIIDD. Moderate TR. No LVH.  - [facility] 10/2018 showed normal cors, elevated filling pressures and low CI at 1.9  - Now admitted w/ NYHA IV symptoms, volume overload and worsening renal function, likely cardiorenal syndrome. SCr peaked to 2.97 (prior baseline 1.2).  - RHC 5/5 showed Low output biventricular failure and marked volume overload. PCWP 27. CI 2.0. - IABP placed 5/7 on 1:1 with good augmentation, removed 5/14.  - Co-ox 64% on 0.1 of milrinone.  - Stop Milrinone today. Follow co-ox  - volume status up. CVP 15-16.  Wt trending up - Give dose of IV Lasix 120 mg bid today  - Poor LVAD candidate with RV dysfunction, noncompliance and renal failure.  - Limited options.   2. AKI - due to shock/cardiorenal - Creatinine peaked at 3 - SCr trending back down, 2.38>>2.25>>2.07>>1.94>>1.85>>2.04>>1.99 > 1.8>>1.9  - Monitor w/ milrinone wean and w/ diuresis     3. Persistent Atrial Fibrillation w/ RVR - Recent DCCV attempt aborted 4/20 due to LAA thrombi on TEE - S/P DC-CV 06/30/19 with brief conversion to NSR but back in A fib - S/P repeat DCCV 5/13.  - Back in AF 5/15. Rebolused w/ amiodarone - In NSR on IV amio and Ranexa - Stop milrinone today - Continue PO amiodarone 200 mg q8H - will avoid?blocker currently given low output and acute decompensation - Continue ranolazine 500 mg bid.  Watch QT - Continue digoxin 0.125 qd  - Now on Eliquis (can't use [name] with Ranexa per PharmD)  4. NSVT - in the setting of severe LV  dysfunction and milrinone  - continue amiodarone  - he has ICD - keep K >4.0 and Mg >2.0   5. Chronic Cholecystitis  - has perc biliary drain - appreciate IR input  6. Hypokalemia - Resolved. K 5.1 today  - monitor w/ diuresis  7. Persistent Nausea and Vomiting/Constipation  - improved   Plan to mobilize more. Get OOB and in chair today. Transfer to progressive care unit.    Length of Stay: [place], PA-C  10:11 AM  Patient seen and examined with the above-signed Advanced Practice Provider and/or Housestaff. I personally reviewed laboratory data, imaging studies and relevant notes. I independently examined the patient and formulated the important aspects of the plan. I have edited the note to reflect any of my changes or salient points. I have personally discussed the plan with the patient and/or family.  Maintaining SR on po amiodarone. Co-ox 64% on milrinone 0.1. CVP 15. Feels ok. Creatinine still up. Minimal drainage from C- tube  General:  Lying in bed  No resp difficulty HEENT: normal Neck: supple. JVP to ear . Carotids 2+ bilat; no bruits. No lymphadenopathy or thryomegaly appreciated. Cor: PMI nondisplaced. Regular rate & rhythm. No rubs, gallops or murmurs. Lungs: clear Abdomen: soft, nontender, nondistended. No hepatosplenomegaly. No bruits or masses. Good bowel sounds. + C-tube Extremities: no cyanosis, clubbing, rash, edema Neuro: alert & orientedx3, cranial nerves grossly intact. moves all 4 extremities w/o difficulty. Affect pleasant  Will wean milrinone to off. Follow co-ox. Continue amio, ranexa and eliquis. Will diurese with IV lasix. Watch renal function. He is not out of the woods yet. Ok to transfer to [facility] [facility].   Glori Bickers, MD  9:19 PM

## 2019-07-10 LAB — BASIC METABOLIC PANEL
Anion gap: 12 (ref 5–15)
BUN: 30 mg/dL — ABNORMAL HIGH (ref 8–23)
CO2: 29 mmol/L (ref 22–32)
Calcium: 9.1 mg/dL (ref 8.9–10.3)
Chloride: 90 mmol/L — ABNORMAL LOW (ref 98–111)
Creatinine, Ser: 2.06 mg/dL — ABNORMAL HIGH (ref 0.61–1.24)
GFR calc Af Amer: 38 mL/min — ABNORMAL LOW (ref >60)
GFR calc non Af Amer: 32 mL/min — ABNORMAL LOW (ref >60)
Glucose, Bld: 99 mg/dL (ref 70–99)
Potassium: 4.6 mmol/L (ref 3.5–5.1)
Sodium: 131 mmol/L — ABNORMAL LOW (ref 135–145)

## 2019-07-10 LAB — COOXEMETRY PANEL
Carboxyhemoglobin: 2.3 % — ABNORMAL HIGH (ref 0.5–1.5)
Methemoglobin: 1 % (ref 0.0–1.5)
O2 Saturation: 49.7 %
Total hemoglobin: 9.8 g/dL — ABNORMAL LOW (ref 12.0–16.0)

## 2019-07-10 MED ORDER — TORSEMIDE 20 MG PO TABS
40.0000 mg | ORAL_TABLET | Freq: Two times a day (BID) | ORAL | Status: DC
  Administered 2019-07-10 – 2019-07-16 (×12): 40 mg via ORAL
  Filled 2019-07-10 (×13): qty 2

## 2019-07-10 NOTE — Progress Notes (Signed)
Electrophysiology Rounding Note  Patient Name: Glenn [name] Date of Encounter: 07/10/2019  Primary Cardiologist: Virl Axe, MD AHF: Dr. Haroldine Laws    Subjective  Feeling pretty good  Inpatient Medications    Scheduled Meds: . amiodarone  200 mg Oral Q8H  . apixaban  5 mg Oral BID  . atorvastatin  20 mg Oral Daily  . Chlorhexidine Gluconate Cloth  6 each Topical Daily  . digoxin  0.125 mg Oral Daily  . docusate sodium  100 mg Oral BID  . feeding supplement  1 Container Oral TID BM  . metoCLOPramide  5 mg Oral TID  . pantoprazole  40 mg Oral Daily  . polyethylene glycol  17 g Oral Daily  . ranolazine  500 mg Oral BID  . sertraline  25 mg Oral Daily  . sodium chloride flush  3 mL Intravenous Q12H   Continuous Infusions: . norepinephrine (LEVOPHED) Adult infusion Stopped (07/01/19 0656)   PRN Meds: acetaminophen **OR** acetaminophen, ALPRAZolam, calcium carbonate, levalbuterol, lidocaine (PF), ondansetron **OR** ondansetron (ZOFRAN) IV, sodium chloride flush, sorbitol, traMADol, traZODone   Vital Signs    Vitals:   07/10/19 0400 07/10/19 0718 07/10/19 1025 07/10/19 1113  BP:  (!) 116/54  123/63  Pulse: 77 83 84 82  Resp: 19 17  17   Temp:  98.4 F (36.9 C)  98.6 F (37 C)  TempSrc:  Oral  Oral  SpO2: 100% 100%  100%  Weight:      Height:        Intake/Output Summary (Last 24 hours) at 07/10/2019 1319 Last data filed at 07/10/2019 1240 Gross per 24 hour  Intake 707.82 ml  Output 1150 ml  Net -442.18 ml   Filed Weights   07/08/19 0500 07/09/19 0500 07/10/19 0352  Weight: 73.6 kg 74.5 kg 70.5 kg    Physical Exam  BP 123/63 (BP Location: Right Arm)   Pulse 82   Temp 98.6 F (37 C) (Oral)   Resp 17   Ht 5\' 7"  (1.702 m)   Wt 70.5 kg   SpO2 100%   BMI 24.35 kg/m  Well developed and well nourished in no acute distress HENT normal Neck supple t Clear Device pocket well healed; without hematoma or erythema.  There is no tethering  Regular rate  and rhythm, no murmur Abd-soft with active BS No Clubbing cyanosis  edema Skin-warm and dry A & Oriented  Grossly normal sensory and motor functio        Labs    CBC Recent Labs    07/08/19 0734  WBC 4.9  HGB 9.7*  HCT 30.4*  MCV 92.1  PLT 892   Basic Metabolic Panel Recent Labs    07/08/19 0428 07/08/19 1412 07/09/19 0442 07/10/19 0400  NA  --    < > 131* 131*  K  --    < > 5.1 4.6  CL  --    < > 92* 90*  CO2  --    < > 30 29  GLUCOSE  --    < > 125* 99  BUN  --    < > 25* 30*  CREATININE  --    < > 1.95* 2.06*  CALCIUM  --    < > 8.9 9.1  MG 1.7  --  2.0  --    < > = values in this interval not displayed.   Liver Function Tests No results for input(s): AST, ALT, ALKPHOS, BILITOT, PROT, ALBUMIN in the last 72  hours. No results for input(s): LIPASE, AMYLASE in the last 72 hours. Cardiac Enzymes No results for input(s): CKTOTAL, CKMB, CKMBINDEX, TROPONINI in the last 72 hours.   Telemetry    Telemetry Personally reviewed sinus   Radiology    No results found.  Patient Profile     Glenn Darden Dejournetteis a 69 y.o.malewith a past medical history significant for medical non-compliance, chronic systolic CHF, CVAs with hemorrhagic conversion, AF with RVR.hewas admitted for Acute on chronic systolic CHF with cardiogenic shock in the setting of AF with RVR.    Assessment & Plan    AF with RVR  NICM    Acute on chronic systolic CHF, cardiogenic shock -improved now  on Milrinone   Kidney injury    Anxiety  ICD -[employer]    holding sinus continue amio dig and Apixoban    Will see again Monday

## 2019-07-10 NOTE — Progress Notes (Signed)
   07/10/19 1108  Clinical Encounter Type  Visited With Patient  Visit Type Initial;Spiritual support;Other (Comment) (Advance Directive)  Referral From Palliative care team  Consult/Referral To Chaplain  Spiritual Encounters  Spiritual Needs Prayer  This chaplain responded to PMT referral for completion of Advance Directive.  AD education is complete, the Pt. is knowledgeable of content of Advance Directive.  The Pt. has chosen cousin-Glenn [name] as HCPOA.  The Pt. chose to delay signing of Advance Directive until the Pt. can talk to his cousin.  The chaplain understands the Pt. will contact spiritual care when he is ready to sign the document.  The chaplain left the Pt. Advance Directive bedside.  The chaplain ended the visit with Pt. prayer.  F/U spiritual care is available as needed.

## 2019-07-10 NOTE — Progress Notes (Signed)
Chaplain was present to complete HCPOA with Pt.  Pt. was on the phone.  Chaplain will return.

## 2019-07-10 NOTE — Progress Notes (Addendum)
Patient ID: Glenn [name], male   DOB: 06/04/1950, 69 y.o.   MRN: [id number]    Advanced Heart Failure Rounding Note   Subjective:    06/25/19 IABP placed 06/30/19  TEE/DC-CV with conversion to NSR but went back in A fib.  07/02/19 Had repeat DCCV.  07/03/19 IABP removed 5/17 back in AF 5/18 back in NSR   Yesterday milrinone was stopped. CO-OX 50%.   Yesterday diuresed with high dose IV lasix.   Denies SOB. Able to get OOB to the chair.     Objective:   Weight Range:  Vital Signs:   Temp:  [97.7 F (36.5 C)-98.7 F (37.1 C)] 98.6 F (37 C) (05/21 1113) Pulse Rate:  [41-98] 82 (05/21 1113) Resp:  [15-23] 17 (05/21 1113) BP: (116-143)/(54-69) 123/63 (05/21 1113) SpO2:  [97 %-100 %] 100 % (05/21 1113) Weight:  [70.5 kg] 70.5 kg (05/21 0352) Last BM Date: 07/05/19  Weight change: Filed Weights   07/08/19 0500 07/09/19 0500 07/10/19 0352  Weight: 73.6 kg 74.5 kg 70.5 kg    Intake/Output:   Intake/Output Summary (Last 24 hours) at 07/10/2019 1428 Last data filed at 07/10/2019 1240 Gross per 24 hour  Intake 467.82 ml  Output 1150 ml  Net -682.18 ml     Physical Exam: General:  Sitting in the chair. No resp difficulty HEENT: normal Neck: supple. JVP 7-8 . Carotids 2+ bilat; no bruits. No lymphadenopathy or thryomegaly appreciated. RIJ  Cor: PMI nondisplaced. Regular rate & rhythm. No rubs, gallops or murmurs. Lungs: clear Abdomen: soft, nontender, nondistended. No hepatosplenomegaly. No bruits or masses. Good bowel sounds. + biliary drain.  Extremities: no cyanosis, clubbing, rash, edema Neuro: alert & orientedx3, cranial nerves grossly intact. moves all 4 extremities w/o difficulty. Affect pleasant   Telemetry:  NSR 70-80s   Labs: Basic Metabolic Panel: Recent Labs  Lab 07/05/19 0411 07/05/19 0411 07/06/19 0340 07/06/19 0340 07/07/19 0418 07/07/19 0418 07/08/19 0428 07/08/19 1412 07/09/19 0442 07/10/19 0400  NA 132*   < > 132*  --  132*  --   --   130* 131* 131*  K 3.8   < > 4.1  --  4.1  --   --  4.0 5.1 4.6  CL 92*   < > 96*  --  94*  --   --  93* 92* 90*  CO2 29   < > 28  --  28  --   --  28 30 29   GLUCOSE 135*   < > 125*  --  142*  --   --  136* 125* 99  BUN 11   < > 14  --  14  --   --  19 25* 30*  CREATININE 1.85*   < > 2.04*  --  1.99*  --   --  1.82* 1.95* 2.06*  CALCIUM 9.1   < > 9.1   < > 8.7*   < >  --  8.6* 8.9 9.1  MG 1.8  --  1.6*  --  2.0  --  1.7  --  2.0  --    < > = values in this interval not displayed.    Liver Function Tests: No results for input(s): AST, ALT, ALKPHOS, BILITOT, PROT, ALBUMIN in the last 168 hours. No results for input(s): LIPASE, AMYLASE in the last 168 hours. No results for input(s): AMMONIA in the last 168 hours.  CBC: Recent Labs  Lab 07/04/19 0318 07/05/19 0411 07/08/19 0734  WBC 5.9  5.9 4.9  HGB 12.2* 11.9* 9.7*  HCT 37.4* 35.8* 30.4*  MCV 89.0 90.2 92.1  PLT 184 199 227    Cardiac Enzymes: No results for input(s): CKTOTAL, CKMB, CKMBINDEX, TROPONINI in the last 168 hours.  BNP: BNP (last 3 results) Recent Labs    02/03/19 2007 06/01/19 1413 06/23/19 1527  BNP 922.2* 531.4* 613.2*    ProBNP (last 3 results) No results for input(s): PROBNP in the last 8760 hours.    Other results:  Imaging: No results found.   Medications:     Scheduled Medications: . amiodarone  200 mg Oral Q8H  . apixaban  5 mg Oral BID  . atorvastatin  20 mg Oral Daily  . Chlorhexidine Gluconate Cloth  6 each Topical Daily  . digoxin  0.125 mg Oral Daily  . docusate sodium  100 mg Oral BID  . feeding supplement  1 Container Oral TID BM  . metoCLOPramide  5 mg Oral TID  . pantoprazole  40 mg Oral Daily  . polyethylene glycol  17 g Oral Daily  . ranolazine  500 mg Oral BID  . sertraline  25 mg Oral Daily  . sodium chloride flush  3 mL Intravenous Q12H    Infusions: . norepinephrine (LEVOPHED) Adult infusion Stopped (07/01/19 0656)    PRN Medications: acetaminophen **OR**  acetaminophen, ALPRAZolam, calcium carbonate, levalbuterol, lidocaine (PF), ondansetron **OR** ondansetron (ZOFRAN) IV, sodium chloride flush, sorbitol, traMADol, traZODone   Assessment/Plan:   1. Acute on Chronic Combined Systolic and Diastolic Heart Failure -> cardiogenic shock - 2/2 NICM. HF dates back to 2015.  - Most recent echo 4/21 w/ EF <20. RV mildly reduced. GIIIDD. Moderate TR. No LVH.  - [facility] 10/2018 showed normal cors, elevated filling pressures and low CI at 1.9  - Now admitted w/ NYHA IV symptoms, volume overload and worsening renal function, likely cardiorenal syndrome. SCr peaked to 2.97 (prior baseline 1.2).  - RHC 5/5 showed Low output biventricular failure and marked volume overload. PCWP 27. CI 2.0. - IABP placed 5/7 on 1:1 with good augmentation, removed 5/14.  - Volume status improving. Start torsemide 40 mg twice a day.   - No bb with low output.   - Renal function stable.  - Poor LVAD candidate with RV dysfunction, noncompliance and renal failure.   2. AKI - due to shock/cardiorenal - Creatinine peaked at 3 - SCr peaked at 2.4  - Todays creatinine 2.06.   3. Persistent Atrial Fibrillation w/ RVR - Recent DCCV attempt aborted 4/20 due to LAA thrombi on TEE - S/P DC-CV 06/30/19 with brief conversion to NSR but back in A fib - S/P repeat DCCV 5/13.  - Back in AF 5/15. Rebolused w/ amiodarone - In NSR today. Continue amiodarone 200 mg q8H  - will avoid?blocker currently given low output and acute decompensation - Continue ranolazine 500 mg bid.  Watch QT - Continue digoxin 0.125 qd  - Now on Eliquis (can't use [name] with Ranexa per PharmD)  4. NSVT - in the setting of severe LV dysfunction and milrinone  - continue amiodarone  - he has ICD - keep K >4.0 and Mg >2.0   5. Chronic Cholecystitis  - has perc biliary drain - appreciate IR input  6. Hypokalemia - Resolved. K 4.6  - monitor w/ diuresis   7. Persistent Nausea and Vomiting/Constipation   Resolved.   Consult PT/OT   Length of Stay: [place], NP  2:28 PM   Patient seen and  examined with the above-signed Advanced Practice Provider and/or Housestaff. I personally reviewed laboratory data, imaging studies and relevant notes. I independently examined the patient and formulated the important aspects of the plan. I have edited the note to reflect any of my changes or salient points. I have personally discussed the plan with the patient and/or family.  Remains in NSR on po amio. Now on Eliquis. Milrinone stopped 5/19. Co-ox down to 50%. Feels Ok but weak.   General:  Weak appearing. No resp difficulty HEENT: normal Neck: supple. no JVD. Carotids 2+ bilat; no bruits. No lymphadenopathy or thryomegaly appreciated. Cor: PMI nondisplaced. Regular rate & rhythm. 2/6 MR/TR Lungs: clear Abdomen: soft, nontender, nondistended. No hepatosplenomegaly. No bruits or masses. Good bowel sounds. Extremities: no cyanosis, clubbing, rash, edema Neuro: alert & orientedx3, cranial nerves grossly intact. moves all 4 extremities w/o difficulty. Affect pleasant  Milrinone stopped yesterday. Co-ox dropping despite maintenance of NSR. Will need to follow closely. Renal function increasing slowly. Agree with switching IV lasix to po torsemide. Now on Eliquis. Needs PT/OT.   Glori Bickers, MD  5:03 PM

## 2019-07-10 NOTE — Progress Notes (Signed)
Chaplain follow up spiritual care visit. The chaplain understands the Pt. is unable to reach his cousin at this time. The Pt. decided to wait for communication from cousin before completing his Advance Directive. The chaplain is available for F/U spiritual care as needed.

## 2019-07-11 LAB — BASIC METABOLIC PANEL
Anion gap: 10 (ref 5–15)
BUN: 36 mg/dL — ABNORMAL HIGH (ref 8–23)
CO2: 31 mmol/L (ref 22–32)
Calcium: 9 mg/dL (ref 8.9–10.3)
Chloride: 93 mmol/L — ABNORMAL LOW (ref 98–111)
Creatinine, Ser: 1.99 mg/dL — ABNORMAL HIGH (ref 0.61–1.24)
GFR calc Af Amer: 39 mL/min — ABNORMAL LOW (ref >60)
GFR calc non Af Amer: 34 mL/min — ABNORMAL LOW (ref >60)
Glucose, Bld: 106 mg/dL — ABNORMAL HIGH (ref 70–99)
Potassium: 4.2 mmol/L (ref 3.5–5.1)
Sodium: 134 mmol/L — ABNORMAL LOW (ref 135–145)

## 2019-07-11 LAB — COOXEMETRY PANEL
Carboxyhemoglobin: 2.3 % — ABNORMAL HIGH (ref 0.5–1.5)
Methemoglobin: 1.2 % (ref 0.0–1.5)
O2 Saturation: 54.5 %
Total hemoglobin: 9.2 g/dL — ABNORMAL LOW (ref 12.0–16.0)

## 2019-07-11 NOTE — Evaluation (Addendum)
Occupational Therapy Evaluation Patient Details Name: Glenn [name] MRN: [id number] DOB: 05-28-1950 Today's Date: 07/11/2019    History of Present Illness 69 y.o. presented and admitted 06/24/19 SOB and perc cholecystotomy tube dysfunction. S/p  R heart cath, central line insertion 06/24/19,  IABP insertion 06/25/19.  06/30/19  TEE/DC-CV with conversion to NSR but went back in A fib. 07/02/19 Had repeat DCCV. Remains in NSR. HR 80s. 07/03/19 IABP. 5/17 back in afib,  5/18 back in NSR removed. PMH of hemorrhagic stroke, PAF, Ischemic cardiomyopathy, DVT, HTN, HLD   Clinical Impression   PTA pt living with son, reports independent for BADL and as needed assist for IADL. At time of eval, pt completing sit <> stands with min guard assist for safety. Pt completed standing grooming tasks with min guard assist. He then completed beyond household level of functional mobility with rollator and x2 rest breaks. He states this is a change of status from baseline, because he could walk without needing to rest. Pt then completed toilet transfer in room with min A for peri care. Given current status, recommend short term SNF to regain independence prior to d/c home. Will continue to follow per POC listed below.    Follow Up Recommendations  SNF    Equipment Recommendations  3 in 1 bedside commode    Recommendations for Other Services       Precautions / Restrictions Precautions Precautions: Fall Restrictions Weight Bearing Restrictions: No      Mobility Bed Mobility Overal bed mobility: Independent             General bed mobility comments: up in chair  Transfers Overall transfer level: Needs assistance Equipment used: 4-wheeled walker Transfers: Sit to/from Stand Sit to Stand: Min guard         General transfer comment: Pt uses UE's appropriately, but kept forgetting to adequately lock the rollator.  Pt's turns were very guarded with pt not wanting to release more than 1 hand at a  time.    Balance Overall balance assessment: Needs assistance Sitting-balance support: Single extremity supported;No upper extremity supported;Feet supported Sitting balance-Leahy Scale: Fair     Standing balance support: Single extremity supported;Bilateral upper extremity supported Standing balance-Leahy Scale: Poor Standing balance comment: reliant on the AD or external support.                           ADL either performed or assessed with clinical judgement   ADL Overall ADL's : Needs assistance/impaired Eating/Feeding: Set up;Sitting   Grooming: Min guard;Standing;Oral care;Wash/dry hands   Upper Body Bathing: Set up;Sitting   Lower Body Bathing: Moderate assistance;Sit to/from stand;Sitting/lateral leans   Upper Body Dressing : Set up;Sitting   Lower Body Dressing: Moderate assistance;Sitting/lateral leans;Sit to/from stand   Toilet Transfer: Minimal assistance;Ambulation;RW;Regular Toilet;Grab bars   Toileting- Clothing Manipulation and Hygiene: Minimal assistance;Sit to/from stand Toileting - Clothing Manipulation Details (indicate cue type and reason): pt able to perform the peri care but needing min A for thouroughness of task, as well as standing steadying support     Functional mobility during ADLs: Min guard;Rolling walker;Cueing for safety       Vision Patient Visual Report: No change from baseline       Perception     Praxis      Pertinent Vitals/Pain Pain Assessment: No/denies pain Faces Pain Scale: No hurt     Hand Dominance     Extremity/Trunk Assessment Upper Extremity Assessment Upper  Extremity Assessment: Generalized weakness;LUE deficits/detail LUE Deficits / Details: L UE weak due to previous stroke.   Lower Extremity Assessment Lower Extremity Assessment: Generalized weakness   Cervical / Trunk Assessment Cervical / Trunk Assessment: Kyphotic   Communication Communication Communication: No difficulties   Cognition  Arousal/Alertness: Awake/alert Behavior During Therapy: WFL for tasks assessed/performed Overall Cognitive Status: No family/caregiver present to determine baseline cognitive functioning Area of Impairment: Problem solving                             Problem Solving: Slow processing;Requires verbal cues General Comments: overall slow to process, needing creased time and VC's   General Comments       Exercises     Shoulder Instructions      Home Living Family/patient expects to be discharged to:: Private residence Living Arrangements: Children(son) Available Help at Discharge: Family;Available PRN/intermittently Type of Home: Apartment Home Access: Level entry     Home Layout: One level     Bathroom Shower/Tub: Teacher, early years/pre: Standard     Home Equipment: Cane - single point;Walker - standard;Bedside commode          Prior Functioning/Environment Level of Independence: Independent with assistive device(s)        Comments: Pt uses RW or cane intermittantly with household distances at baseline        OT Problem List: Decreased strength;Decreased knowledge of use of DME or AE;Decreased activity tolerance;Cardiopulmonary status limiting activity;Impaired balance (sitting and/or standing)      OT Treatment/Interventions: Self-care/ADL training;Therapeutic exercise;Patient/family education;Balance training;Energy conservation;Therapeutic activities;DME and/or AE instruction    OT Goals(Current goals can be found in the care plan section) Acute Rehab OT Goals Patient Stated Goal: return to independence OT Goal Formulation: With patient Time For Goal Achievement: 07/25/19 Potential to Achieve Goals: Good  OT Frequency: Min 2X/week   Barriers to D/C:            Co-evaluation              AM-PAC OT "6 Clicks" Daily Activity     Outcome Measure Help from another person eating meals?: None Help from another person taking care  of personal grooming?: A Little Help from another person toileting, which includes using toliet, bedpan, or urinal?: A Little Help from another person bathing (including washing, rinsing, drying)?: A Little Help from another person to put on and taking off regular upper body clothing?: None Help from another person to put on and taking off regular lower body clothing?: A Lot 6 Click Score: 19   End of Session Equipment Utilized During Treatment: Gait belt;Rolling walker Nurse Communication: Mobility status  Activity Tolerance: Patient tolerated treatment well Patient left: in chair;with call bell/phone within reach  OT Visit Diagnosis: Unsteadiness on feet (R26.81);Other abnormalities of gait and mobility (R26.89);Muscle weakness (generalized) (M62.81)                Time: 0240-9735 OT Time Calculation (min): 14 min Charges:  OT General Charges $OT Visit: 1 Visit OT Evaluation $OT Eval Moderate Complexity: [place], MSOT, OTR/L [facility] Office Number: [phone] Pager: [phone]  Zenovia Jarred 07/11/2019, 4:09 PM

## 2019-07-11 NOTE — Progress Notes (Signed)
Patient ID: Glenn [name], male   DOB: 25-Nov-1950, 69 y.o.   MRN: [id number]    Advanced Heart Failure Rounding Note   Subjective:    06/25/19 IABP placed 06/30/19  TEE/DC-CV with conversion to NSR but went back in A fib.  07/02/19 Had repeat DCCV.  07/03/19 IABP removed 5/17 back in AF 5/18 back in NSR   Milrinone stopped 5/20. Co-ox 50% -> 55%   Feels better today. Denies SOB, orthopnea or PND.   Remains in NSR on po amio.    Objective:   Weight Range:  Vital Signs:   Temp:  [97.7 F (36.5 C)-98.7 F (37.1 C)] 98.7 F (37.1 C) (05/22 1151) Pulse Rate:  [76-84] 81 (05/22 0343) Resp:  [15-20] 20 (05/22 1151) BP: (104-126)/(56-71) 126/63 (05/22 1151) SpO2:  [96 %-100 %] 100 % (05/22 1151) Weight:  [74.5 kg] 74.5 kg (05/22 0343) Last BM Date: 07/10/19  Weight change: Filed Weights   07/09/19 0500 07/10/19 0352 07/11/19 0343  Weight: 74.5 kg 70.5 kg 74.5 kg    Intake/Output:   Intake/Output Summary (Last 24 hours) at 07/11/2019 1447 Last data filed at 07/11/2019 1300 Gross per 24 hour  Intake 246 ml  Output 1581 ml  Net -1335 ml     Physical Exam: General:  Sitting in the chair. No resp difficulty HEENT: normal Neck: supple. no JVD. Carotids 2+ bilat; no bruits. No lymphadenopathy or thryomegaly appreciated. Cor: PMI nondisplaced. Regular rate & rhythm. No rubs, gallops or murmurs. Lungs: clear Abdomen: soft, nontender, nondistended. No hepatosplenomegaly. No bruits or masses. Good bowel sounds. + perc drain Extremities: no cyanosis, clubbing, rash, edema Neuro: alert & orientedx3, cranial nerves grossly intact. moves all 4 extremities w/o difficulty. Affect pleasant  Telemetry:  NSR 70-80s Personally reviewed  Labs: Basic Metabolic Panel: Recent Labs  Lab 07/05/19 0411 07/05/19 0411 07/06/19 0340 07/06/19 0340 07/07/19 0418 07/07/19 0418 07/08/19 0428 07/08/19 1412 07/08/19 1412 07/09/19 0442 07/10/19 0400 07/11/19 0338  NA 132*   < > 132*    < > 132*  --   --  130*  --  131* 131* 134*  K 3.8   < > 4.1   < > 4.1  --   --  4.0  --  5.1 4.6 4.2  CL 92*   < > 96*   < > 94*  --   --  93*  --  92* 90* 93*  CO2 29   < > 28   < > 28  --   --  28  --  30 29 31   GLUCOSE 135*   < > 125*   < > 142*  --   --  136*  --  125* 99 106*  BUN 11   < > 14   < > 14  --   --  19  --  25* 30* 36*  CREATININE 1.85*   < > 2.04*   < > 1.99*  --   --  1.82*  --  1.95* 2.06* 1.99*  CALCIUM 9.1   < > 9.1   < > 8.7*   < >  --  8.6*   < > 8.9 9.1 9.0  MG 1.8  --  1.6*  --  2.0  --  1.7  --   --  2.0  --   --    < > = values in this interval not displayed.    Liver Function Tests: No results for input(s): AST, ALT, ALKPHOS,  BILITOT, PROT, ALBUMIN in the last 168 hours. No results for input(s): LIPASE, AMYLASE in the last 168 hours. No results for input(s): AMMONIA in the last 168 hours.  CBC: Recent Labs  Lab 07/05/19 0411 07/08/19 0734  WBC 5.9 4.9  HGB 11.9* 9.7*  HCT 35.8* 30.4*  MCV 90.2 92.1  PLT 199 227    Cardiac Enzymes: No results for input(s): CKTOTAL, CKMB, CKMBINDEX, TROPONINI in the last 168 hours.  BNP: BNP (last 3 results) Recent Labs    02/03/19 2007 06/01/19 1413 06/23/19 1527  BNP 922.2* 531.4* 613.2*    ProBNP (last 3 results) No results for input(s): PROBNP in the last 8760 hours.    Other results:  Imaging: No results found.   Medications:     Scheduled Medications: . amiodarone  200 mg Oral Q8H  . apixaban  5 mg Oral BID  . atorvastatin  20 mg Oral Daily  . Chlorhexidine Gluconate Cloth  6 each Topical Daily  . digoxin  0.125 mg Oral Daily  . docusate sodium  100 mg Oral BID  . feeding supplement  1 Container Oral TID BM  . metoCLOPramide  5 mg Oral TID  . pantoprazole  40 mg Oral Daily  . polyethylene glycol  17 g Oral Daily  . ranolazine  500 mg Oral BID  . sertraline  25 mg Oral Daily  . sodium chloride flush  3 mL Intravenous Q12H  . torsemide  40 mg Oral BID    Infusions: .  norepinephrine (LEVOPHED) Adult infusion Stopped (07/01/19 0656)    PRN Medications: acetaminophen **OR** acetaminophen, ALPRAZolam, calcium carbonate, levalbuterol, lidocaine (PF), ondansetron **OR** ondansetron (ZOFRAN) IV, sodium chloride flush, sorbitol, traMADol, traZODone   Assessment/Plan:   1. Acute on Chronic Combined Systolic and Diastolic Heart Failure -> cardiogenic shock - 2/2 NICM. HF dates back to 2015.  - Most recent echo 4/21 w/ EF <20. RV mildly reduced. GIIIDD. Moderate TR. No LVH.  - [facility] 10/2018 showed normal cors, elevated filling pressures and low CI at 1.9  - Now admitted w/ NYHA IV symptoms, volume overload and worsening renal function, likely cardiorenal syndrome. SCr peaked to 2.97 (prior baseline 1.2).  - RHC 5/5 showed Low output biventricular failure and marked volume overload. PCWP 27. CI 2.0. - IABP placed 5/7 on 1:1 with good augmentation, removed 5/14.  - Volume status stable. On torsemide 40 mg twice a day.   - Co-ox 55% off milrinone - No bb with low output.   - Renal function stable.  - Poor LVAD candidate with RV dysfunction, noncompliance and renal failure.   2. AKI - due to shock/cardiorenal - Creatinine peaked at 3 - SCr peaked at 2.4  - Todays creatinine 2.06 -> 1.99  3. Persistent Atrial Fibrillation w/ RVR - Recent DCCV attempt aborted 4/20 due to LAA thrombi on TEE - S/P DC-CV 06/30/19 with brief conversion to NSR but back in A fib - S/P repeat DCCV 5/13.  - Back in AF 5/15. Rebolused w/ amiodarone - In NSR today. Continue amiodarone 200 mg q8H  - will avoid?blocker currently given low output and acute decompensation - Continue ranolazine 500 mg bid.  Watch QT - Continue digoxin 0.125 qd  - Now on Eliquis (can't use [name] with Ranexa per PharmD) - No bleeding  4. NSVT - in the setting of severe LV dysfunction and milrinone  - continue amiodarone  - he has ICD - keep K >4.0 and Mg >2.0   5. Chronic Cholecystitis  -  has  perc biliary drain - appreciate IR input  6. Hypokalemia - Resolved. K 4.2  7. Persistent Nausea and Vomiting/Constipation  Resolved.   Continues to improve. Possibly home 24-48 hours with close outpatient f/u.   Length of Stay: 20  Glori Bickers, MD  2:47 PM

## 2019-07-11 NOTE — Progress Notes (Signed)
Patient has bile drain on Rt. Abdomen. Bile juice leaking around dressing and  When flushed with NS 5 ml, patient felt pressure and leaking from the site. Cleaned the site and changed dressing. Patient had three times BM today, hold stool softener in the morning. HS [employer]

## 2019-07-11 NOTE — Evaluation (Signed)
Physical Therapy Evaluation Patient Details Name: Glenn [name] MRN: [id number] DOB: 1950-03-26 Today's Date: 07/11/2019   History of Present Illness  69 y.o. presented and admitted 06/24/19 SOB and perc cholecystotomy tube dysfunction. S/p  R heart cath, central line insertion 06/24/19,  IABP insertion 06/25/19.  06/30/19  TEE/DC-CV with conversion to NSR but went back in A fib. 07/02/19 Had repeat DCCV. Remains in NSR. HR 80s. 07/03/19 IABP. 5/17 back in afib,  5/18 back in NSR removed. PMH of hemorrhagic stroke, PAF, Ischemic cardiomyopathy, DVT, HTN, HLD  Clinical Impression  Pt admitted with/for SOB.  Over the admission time length, pt has become significantly deconditioned and weak.  He is needing min the min guard assist for OOB mobility..  Pt currently limited functionally due to the problems listed. ( See problems list.)   Pt will benefit from PT to maximize function and safety in order to get ready for next venue listed below.      Follow Up Recommendations SNF    Equipment Recommendations  None recommended by PT    Recommendations for Other Services       Precautions / Restrictions Precautions Precautions: None      Mobility  Bed Mobility Overal bed mobility: Independent             General bed mobility comments: In the RW with OT on arrival  Transfers Overall transfer level: Needs assistance Equipment used: 4-wheeled walker Transfers: Sit to/from Stand Sit to Stand: Min guard         General transfer comment: Pt uses UE's appropriately, but kept forgetting to adequately lock the rollator.  Pt's turns were very guarded with pt not wanting to release more than 1 hand at a time.  Ambulation/Gait Ambulation/Gait assistance: Min guard Gait Distance (Feet): 40 Feet(then 50 feet, 90 feet, then 180 feet with rest between.) Assistive device: 4-wheeled walker Gait Pattern/deviations: Step-through pattern Gait velocity: decreased Gait velocity interpretation:  <1.8 ft/sec, indicate of risk for recurrent falls General Gait Details: consistently shuffled L LE or low amplitude steps overall.  Flexed posture and occasionally letting the rollator get going to fast.  Turning around to sit in the rollator was slow and tenuous.  Stairs            Wheelchair Mobility    Modified Rankin (Stroke Patients Only)       Balance Overall balance assessment: Needs assistance Sitting-balance support: Single extremity supported;No upper extremity supported;Feet supported Sitting balance-Leahy Scale: Fair     Standing balance support: Single extremity supported;Bilateral upper extremity supported Standing balance-Leahy Scale: Poor Standing balance comment: reliant on the AD or external support.                             Pertinent Vitals/Pain Faces Pain Scale: No hurt    Home Living Family/patient expects to be discharged to:: Private residence Living Arrangements: Children Available Help at Discharge: Family;Available PRN/intermittently Type of Home: Apartment Home Access: Level entry     Home Layout: One level Home Equipment: Cane - single point;Walker - standard;Bedside commode      Prior Function Level of Independence: Independent with assistive device(s)         Comments: Pt uses RW or cane intermittantly with household distances at baseline     Hand Dominance        Extremity/Trunk Assessment   Upper Extremity Assessment Upper Extremity Assessment: Defer to OT evaluation LUE Deficits / Details:  L UE weak due to stroke.    Lower Extremity Assessment Lower Extremity Assessment: Generalized weakness(L LE weaker than R LE)    Cervical / Trunk Assessment Cervical / Trunk Assessment: Kyphotic  Communication   Communication: No difficulties  Cognition Arousal/Alertness: Awake/alert Behavior During Therapy: WFL for tasks assessed/performed Overall Cognitive Status: Difficult to assess(NT formally, appears  slower of thought process)                                        General Comments      Exercises     Assessment/Plan    PT Assessment Patient needs continued PT services  PT Problem List Decreased strength;Decreased activity tolerance;Decreased balance;Decreased mobility;Decreased knowledge of use of DME       PT Treatment Interventions      PT Goals (Current goals can be found in the Care Plan section)  Acute Rehab PT Goals PT Goal Formulation: All assessment and education complete, DC therapy Time For Goal Achievement: 07/25/19 Potential to Achieve Goals: Good    Frequency     Barriers to discharge        Co-evaluation               AM-PAC PT "6 Clicks" Mobility  Outcome Measure Help needed turning from your back to your side while in a flat bed without using bedrails?: None Help needed moving from lying on your back to sitting on the side of a flat bed without using bedrails?: None Help needed moving to and from a bed to a chair (including a wheelchair)?: A Little Help needed standing up from a chair using your arms (e.g., wheelchair or bedside chair)?: A Little Help needed to walk in hospital room?: A Little Help needed climbing 3-5 steps with a railing? : A Little 6 Click Score: 20    End of Session Equipment Utilized During Treatment: Gait belt Activity Tolerance: Patient tolerated treatment well;Patient limited by fatigue Patient left: with call bell/phone within reach;in chair Nurse Communication: Mobility status PT Visit Diagnosis: Unsteadiness on feet (R26.81);Other abnormalities of gait and mobility (R26.89);Muscle weakness (generalized) (M62.81);Difficulty in walking, not elsewhere classified (R26.2)    Time: 1220-1240 PT Time Calculation (min) (ACUTE ONLY): 20 min   Charges:   PT Evaluation $PT Eval Moderate Complexity: 1 Mod          07/11/2019  Ginger Carne., PT Acute Rehabilitation Services [phone]   (pager) [phone]  (office)  Tessie Fass Taylen Wendland 07/11/2019, 1:13 PM

## 2019-07-11 NOTE — Plan of Care (Signed)

## 2019-07-12 LAB — COOXEMETRY PANEL
Carboxyhemoglobin: 2.4 % — ABNORMAL HIGH (ref 0.5–1.5)
Methemoglobin: 1.1 % (ref 0.0–1.5)
O2 Saturation: 55.1 %
Total hemoglobin: 9.4 g/dL — ABNORMAL LOW (ref 12.0–16.0)

## 2019-07-12 LAB — BASIC METABOLIC PANEL
Anion gap: 8 (ref 5–15)
BUN: 41 mg/dL — ABNORMAL HIGH (ref 8–23)
CO2: 31 mmol/L (ref 22–32)
Calcium: 9 mg/dL (ref 8.9–10.3)
Chloride: 93 mmol/L — ABNORMAL LOW (ref 98–111)
Creatinine, Ser: 2.04 mg/dL — ABNORMAL HIGH (ref 0.61–1.24)
GFR calc Af Amer: 38 mL/min — ABNORMAL LOW (ref >60)
GFR calc non Af Amer: 33 mL/min — ABNORMAL LOW (ref >60)
Glucose, Bld: 108 mg/dL — ABNORMAL HIGH (ref 70–99)
Potassium: 3.7 mmol/L (ref 3.5–5.1)
Sodium: 132 mmol/L — ABNORMAL LOW (ref 135–145)

## 2019-07-12 MED ORDER — POTASSIUM CHLORIDE ER 10 MEQ PO TBCR
20.0000 meq | EXTENDED_RELEASE_TABLET | Freq: Once | ORAL | Status: AC
  Administered 2019-07-12: 20 meq via ORAL
  Filled 2019-07-12 (×2): qty 2

## 2019-07-12 NOTE — Progress Notes (Signed)
Patient ID: Glenn [name], male   DOB: [birth year], 69 y.o.   MRN: [id number]    Advanced Heart Failure Rounding Note   Subjective:    06/25/19 IABP placed 06/30/19  TEE/DC-CV with conversion to NSR but went back in A fib.  07/02/19 Had repeat DCCV.  07/03/19 IABP removed 5/17 back in AF 5/18 back in NSR   Milrinone stopped 5/20. Co-ox 50% -> 55% -> 55%  Feels ok today. Developed recurrent AF last night at 1030p remains in AF rates 80-90s.   No CP or SOB. No nausea.     Objective:   Weight Range:  Vital Signs:   Temp:  [98.6 F (37 C)-98.7 F (37.1 C)] 98.6 F (37 C) (05/23 0415) Pulse Rate:  [58-74] 58 (05/23 0415) Resp:  [12-20] 17 (05/23 0415) BP: (113-154)/(51-70) 115/70 (05/23 0415) SpO2:  [97 %-100 %] 99 % (05/23 0415) Weight:  [69.4 kg] 69.4 kg (05/23 0415) Last BM Date: 07/11/19  Weight change: Filed Weights   07/10/19 0352 07/11/19 0343 07/12/19 0415  Weight: 70.5 kg 74.5 kg 69.4 kg    Intake/Output:   Intake/Output Summary (Last 24 hours) at 07/12/2019 1000 Last data filed at 07/12/2019 0500 Gross per 24 hour  Intake 636 ml  Output 803 ml  Net -167 ml     Physical Exam: General:  Sitting up in bed . No resp difficulty HEENT: normal Neck: supple. no JVD. Carotids 2+ bilat; no bruits. No lymphadenopathy or thryomegaly appreciated. Cor: PMI nondisplaced. Irregular rate & rhythm. No rubs, gallops or murmurs. Lungs: clear Abdomen: soft, nontender, nondistended. No hepatosplenomegaly. No bruits or masses. Good bowel sounds. + biliary drain Extremities: no cyanosis, clubbing, rash, edema Neuro: alert & orientedx3, cranial nerves grossly intact. moves all 4 extremities w/o difficulty. Affect pleasant   Telemetry:  AF 80-90s Personally reviewed  Labs: Basic Metabolic Panel: Recent Labs  Lab 07/06/19 0340 07/06/19 0340 07/07/19 0418 07/07/19 0418 07/08/19 0428 07/08/19 1412 07/08/19 1412 07/09/19 0442 07/09/19 0442 07/10/19 0400  07/11/19 0338 07/12/19 0444  NA 132*   < > 132*   < >  --  130*  --  131*  --  131* 134* 132*  K 4.1   < > 4.1   < >  --  4.0  --  5.1  --  4.6 4.2 3.7  CL 96*   < > 94*   < >  --  93*  --  92*  --  90* 93* 93*  CO2 28   < > 28   < >  --  28  --  30  --  29 31 31   GLUCOSE 125*   < > 142*   < >  --  136*  --  125*  --  99 106* 108*  BUN 14   < > 14   < >  --  19  --  25*  --  30* 36* 41*  CREATININE 2.04*   < > 1.99*   < >  --  1.82*  --  1.95*  --  2.06* 1.99* 2.04*  CALCIUM 9.1   < > 8.7*   < >  --  8.6*   < > 8.9   < > 9.1 9.0 9.0  MG 1.6*  --  2.0  --  1.7  --   --  2.0  --   --   --   --    < > = values in this interval not displayed.  Liver Function Tests: No results for input(s): AST, ALT, ALKPHOS, BILITOT, PROT, ALBUMIN in the last 168 hours. No results for input(s): LIPASE, AMYLASE in the last 168 hours. No results for input(s): AMMONIA in the last 168 hours.  CBC: Recent Labs  Lab 07/08/19 0734  WBC 4.9  HGB 9.7*  HCT 30.4*  MCV 92.1  PLT 227    Cardiac Enzymes: No results for input(s): CKTOTAL, CKMB, CKMBINDEX, TROPONINI in the last 168 hours.  BNP: BNP (last 3 results) Recent Labs    02/03/19 2007 06/01/19 1413 06/23/19 1527  BNP 922.2* 531.4* 613.2*    ProBNP (last 3 results) No results for input(s): PROBNP in the last 8760 hours.    Other results:  Imaging: No results found.   Medications:     Scheduled Medications: . amiodarone  200 mg Oral Q8H  . apixaban  5 mg Oral BID  . atorvastatin  20 mg Oral Daily  . Chlorhexidine Gluconate Cloth  6 each Topical Daily  . digoxin  0.125 mg Oral Daily  . docusate sodium  100 mg Oral BID  . feeding supplement  1 Container Oral TID BM  . metoCLOPramide  5 mg Oral TID  . pantoprazole  40 mg Oral Daily  . polyethylene glycol  17 g Oral Daily  . ranolazine  500 mg Oral BID  . sertraline  25 mg Oral Daily  . sodium chloride flush  3 mL Intravenous Q12H  . torsemide  40 mg Oral BID     Infusions: . norepinephrine (LEVOPHED) Adult infusion Stopped (07/01/19 0656)    PRN Medications: acetaminophen **OR** acetaminophen, ALPRAZolam, calcium carbonate, levalbuterol, lidocaine (PF), ondansetron **OR** ondansetron (ZOFRAN) IV, sodium chloride flush, sorbitol, traMADol, traZODone   Assessment/Plan:   1. Acute on Chronic Combined Systolic and Diastolic Heart Failure -> cardiogenic shock - 2/2 NICM. HF dates back to 2015.  - Most recent echo 4/21 w/ EF <20. RV mildly reduced. GIIIDD. Moderate TR. No LVH.  - [facility] 10/2018 showed normal cors, elevated filling pressures and low CI at 1.9  - Now admitted w/ NYHA IV symptoms, volume overload and worsening renal function, likely cardiorenal syndrome. SCr peaked to 2.97 (prior baseline 1.2).  - RHC 5/5 showed Low output biventricular failure and marked volume overload. PCWP 27. CI 2.0. - IABP placed 5/7 on 1:1 with good augmentation, removed 5/14.  - Volume status stable. On torsemide 40 mg twice a day.   - Co-ox 55% off milrinone. Unchanged today - No bb with low output.   - Renal function stable.  - Poor LVAD candidate with RV dysfunction, noncompliance and renal failure.   2. AKI - due to shock/cardiorenal - Creatinine peaked at 3 - SCr peaked at 2.4  - Todays creatinine 2.06 -> 1.99 -> 2.04  3. Persistent Atrial Fibrillation w/ RVR - Recent DCCV attempt aborted 4/20 due to LAA thrombi on TEE - S/P DC-CV 06/30/19 with brief conversion to NSR but back in A fib - S/P repeat DCCV 5/13.  - Back in AF overnight. Rate well controlled. Continue amiodarone 200 mg q8H  - will avoid?blocker currently given low output and acute decompensation - Continue ranolazine 500 mg bid.  Watch QT - Continue digoxin 0.125 qd  - Now on Eliquis (can't use [name] with Ranexa per PharmD) - No bleeding  4. NSVT - in the setting of severe LV dysfunction and milrinone  - continue amiodarone  - he has ICD - keep K >4.0 and Mg >2.0  5.  Chronic Cholecystitis  - has perc biliary drain - appreciate IR input  6. Hypokalemia - K 3.7. Will supp   7. Persistent Nausea and Vomiting/Constipation  Resolved.   I was planning on discharging him today but developed recurrent AF. Rate improved though. Will follow today and plan d/c in am. Hopefully will be back in NSR. Will d/w Dr. [name] in am.   Length of Stay: 19  Glori Bickers, MD  10:00 AM

## 2019-07-13 LAB — BASIC METABOLIC PANEL
Anion gap: 16 — ABNORMAL HIGH (ref 5–15)
BUN: 43 mg/dL — ABNORMAL HIGH (ref 8–23)
CO2: 30 mmol/L (ref 22–32)
Calcium: 9 mg/dL (ref 8.9–10.3)
Chloride: 89 mmol/L — ABNORMAL LOW (ref 98–111)
Creatinine, Ser: 2.03 mg/dL — ABNORMAL HIGH (ref 0.61–1.24)
GFR calc Af Amer: 38 mL/min — ABNORMAL LOW (ref >60)
GFR calc non Af Amer: 33 mL/min — ABNORMAL LOW (ref >60)
Glucose, Bld: 110 mg/dL — ABNORMAL HIGH (ref 70–99)
Potassium: 3.7 mmol/L (ref 3.5–5.1)
Sodium: 135 mmol/L (ref 135–145)

## 2019-07-13 LAB — COOXEMETRY PANEL
Carboxyhemoglobin: 2.4 % — ABNORMAL HIGH (ref 0.5–1.5)
Methemoglobin: 1.1 % (ref 0.0–1.5)
O2 Saturation: 63.6 %
Total hemoglobin: 9.6 g/dL — ABNORMAL LOW (ref 12.0–16.0)

## 2019-07-13 MED ORDER — AMIODARONE IV BOLUS ONLY 150 MG/100ML
150.0000 mg | Freq: Once | INTRAVENOUS | Status: AC
  Administered 2019-07-13: 150 mg via INTRAVENOUS
  Filled 2019-07-13: qty 100

## 2019-07-13 NOTE — Plan of Care (Signed)

## 2019-07-13 NOTE — Progress Notes (Signed)
Physical Therapy Treatment Patient Details Name: Glenn [name] MRN: [id number] DOB: 1951/02/07 Today's Date: 07/13/2019    History of Present Illness 69 y.o. presented and admitted 06/24/19 SOB and perc cholecystotomy tube dysfunction. S/p  R heart cath, central line insertion 06/24/19,  IABP insertion 06/25/19.  06/30/19  TEE/DC-CV with conversion to NSR but went back in A fib. 07/02/19 Had repeat DCCV. 07/03/19 IABP removal. 5/17 back in afib,  5/18 back in NSR. PMHx: hemorrhagic stroke with left weakness, PAF, Ischemic cardiomyopathy, DVT, HTN, HLD    PT Comments    Pt in bathroom on arrival with assist for pericare in standing. Pt tolerating increased gait distance this session with need for seated rest and encouraged use of rollator at home to progress distance with safety of seat with fatigue. Pt educated for HEP and reports son works and cannot assist at all times. Pt continues to progress with gait and may be able to consider D/C home if mobility continues to improve.  HR 85-102    Follow Up Recommendations  SNF;Supervision for mobility/OOB     Equipment Recommendations  None recommended by PT    Recommendations for Other Services       Precautions / Restrictions Precautions Precautions: Fall Precaution Comments: cholecystectomy drain    Mobility  Bed Mobility               General bed mobility comments: in bathroom on arrival and chair end of session  Transfers Overall transfer level: Needs assistance   Transfers: Sit to/from Stand Sit to Stand: Min guard         General transfer comment: cues for hand placement to rise from toilet and sit in chair x 2  Ambulation/Gait Ambulation/Gait assistance: Min guard Gait Distance (Feet): 130 Feet Assistive device: Rolling walker (2 wheeled) Gait Pattern/deviations: Step-through pattern;Decreased stride length   Gait velocity interpretation: 1.31 - 2.62 ft/sec, indicative of limited community  ambulator General Gait Details: pt with use of RW able to walk 130' x 2 trials with seated rest between trials. Cues for posture and direction   Stairs             Wheelchair Mobility    Modified Rankin (Stroke Patients Only)       Balance     Sitting balance-Leahy Scale: Good     Standing balance support: Bilateral upper extremity supported Standing balance-Leahy Scale: Poor Standing balance comment: reliant on the AD                            Cognition Arousal/Alertness: Awake/alert Behavior During Therapy: WFL for tasks assessed/performed Overall Cognitive Status: Impaired/Different from baseline                               Problem Solving: Slow processing        Exercises Total Joint Exercises Hip ABduction/ADduction: AROM;Both;Seated;15 reps Long Arc Quad: AROM;Both;Seated;15 reps Marching in Standing: AROM;Both;15 reps;Seated    General Comments        Pertinent Vitals/Pain Pain Assessment: No/denies pain    Home Living                      Prior Function            PT Goals (current goals can now be found in the care plan section) Progress towards PT goals: Progressing toward goals  Frequency    Min 3X/week      PT Plan Current plan remains appropriate    Co-evaluation              AM-PAC PT "6 Clicks" Mobility   Outcome Measure  Help needed turning from your back to your side while in a flat bed without using bedrails?: None Help needed moving from lying on your back to sitting on the side of a flat bed without using bedrails?: None Help needed moving to and from a bed to a chair (including a wheelchair)?: A Little Help needed standing up from a chair using your arms (e.g., wheelchair or bedside chair)?: A Little Help needed to walk in hospital room?: A Little Help needed climbing 3-5 steps with a railing? : A Little 6 Click Score: 20    End of Session   Activity Tolerance:  Patient tolerated treatment well Patient left: in chair;with call bell/phone within reach;with chair alarm set Nurse Communication: Mobility status PT Visit Diagnosis: Unsteadiness on feet (R26.81);Other abnormalities of gait and mobility (R26.89);Muscle weakness (generalized) (M62.81);Difficulty in walking, not elsewhere classified (R26.2)     Time: 8004-4715 PT Time Calculation (min) (ACUTE ONLY): 27 min  Charges:  $Gait Training: 8-22 mins $Therapeutic Exercise: 8-22 mins                     Tiombe Tomeo P, PT Acute Rehabilitation Services Pager: [phone] Office: [place] 07/13/2019, 1:18 PM

## 2019-07-13 NOTE — Progress Notes (Signed)
Patient ID: Glenn [name], male   DOB: [birth year], 69 y.o.   MRN: [id number]    Advanced Heart Failure Rounding Note   Subjective:    06/25/19 IABP placed 06/30/19  TEE/DC-CV with conversion to NSR but went back in A fib.  07/02/19 Had repeat DCCV.  07/03/19 IABP removed 5/17 back in AF 5/18 back in NSR   Milrinone stopped 5/20. Co-ox 50% -> 55% -> 55% -> 64%  Feels good today. Denies CP or SOB. Remains in AF. Rates 80-90s. Renal function stable.     Objective:   Weight Range:  Vital Signs:   Temp:  [98 F (36.7 C)-98.6 F (37 C)] 98.6 F (37 C) (05/24 0449) Pulse Rate:  [81-99] 81 (05/24 0449) Resp:  [13-19] 14 (05/24 0449) BP: (110-128)/(55-75) 110/55 (05/24 0449) SpO2:  [98 %] 98 % (05/24 0449) Weight:  [72.8 kg] 72.8 kg (05/24 0500) Last BM Date: 07/12/19  Weight change: Filed Weights   07/11/19 0343 07/12/19 0415 07/13/19 0500  Weight: 74.5 kg 69.4 kg 72.8 kg    Intake/Output:   Intake/Output Summary (Last 24 hours) at 07/13/2019 0845 Last data filed at 07/13/2019 0600 Gross per 24 hour  Intake 247 ml  Output 1805 ml  Net -1558 ml     Physical Exam: General:  Sitting up in chair. No resp difficulty HEENT: normal Neck: supple. JVP 8. Carotids 2+ bilat; no bruits. No lymphadenopathy or thryomegaly appreciated. Cor: PMI nondisplaced. Irregular rate & rhythm. No rubs, gallops or murmurs. Lungs: clear Abdomen: soft, nontender, nondistended. No hepatosplenomegaly. No bruits or masses. Good bowel sounds. + drain Extremities: no cyanosis, clubbing, rash, edema Neuro: alert & orientedx3, cranial nerves grossly intact. moves all 4 extremities w/o difficulty. Affect pleasant    Telemetry:  AF 80-90s Personally reviewed  Labs: Basic Metabolic Panel: Recent Labs  Lab 07/07/19 0418 07/08/19 0428 07/08/19 1412 07/09/19 0442 07/09/19 0442 07/10/19 0400 07/10/19 0400 07/11/19 0338 07/12/19 0444 07/13/19 0300  NA 132*  --    < > 131*  --  131*  --  134*  132* 135  K 4.1  --    < > 5.1  --  4.6  --  4.2 3.7 3.7  CL 94*  --    < > 92*  --  90*  --  93* 93* 89*  CO2 28  --    < > 30  --  29  --  31 31 30   GLUCOSE 142*  --    < > 125*  --  99  --  106* 108* 110*  BUN 14  --    < > 25*  --  30*  --  36* 41* 43*  CREATININE 1.99*  --    < > 1.95*  --  2.06*  --  1.99* 2.04* 2.03*  CALCIUM 8.7*  --    < > 8.9   < > 9.1   < > 9.0 9.0 9.0  MG 2.0 1.7  --  2.0  --   --   --   --   --   --    < > = values in this interval not displayed.    Liver Function Tests: No results for input(s): AST, ALT, ALKPHOS, BILITOT, PROT, ALBUMIN in the last 168 hours. No results for input(s): LIPASE, AMYLASE in the last 168 hours. No results for input(s): AMMONIA in the last 168 hours.  CBC: Recent Labs  Lab 07/08/19 0734  WBC 4.9  HGB 9.7*  HCT 30.4*  MCV 92.1  PLT 227    Cardiac Enzymes: No results for input(s): CKTOTAL, CKMB, CKMBINDEX, TROPONINI in the last 168 hours.  BNP: BNP (last 3 results) Recent Labs    02/03/19 2007 06/01/19 1413 06/23/19 1527  BNP 922.2* 531.4* 613.2*    ProBNP (last 3 results) No results for input(s): PROBNP in the last 8760 hours.    Other results:  Imaging: No results found.   Medications:     Scheduled Medications: . amiodarone  200 mg Oral Q8H  . apixaban  5 mg Oral BID  . atorvastatin  20 mg Oral Daily  . Chlorhexidine Gluconate Cloth  6 each Topical Daily  . digoxin  0.125 mg Oral Daily  . docusate sodium  100 mg Oral BID  . feeding supplement  1 Container Oral TID BM  . metoCLOPramide  5 mg Oral TID  . pantoprazole  40 mg Oral Daily  . polyethylene glycol  17 g Oral Daily  . ranolazine  500 mg Oral BID  . sertraline  25 mg Oral Daily  . sodium chloride flush  3 mL Intravenous Q12H  . torsemide  40 mg Oral BID    Infusions: . norepinephrine (LEVOPHED) Adult infusion Stopped (07/01/19 0656)    PRN Medications: acetaminophen **OR** acetaminophen, ALPRAZolam, calcium carbonate,  levalbuterol, lidocaine (PF), ondansetron **OR** ondansetron (ZOFRAN) IV, sodium chloride flush, sorbitol, traMADol, traZODone   Assessment/Plan:   1. Acute on Chronic Combined Systolic and Diastolic Heart Failure -> cardiogenic shock - 2/2 NICM. HF dates back to 2015.  - Most recent echo 4/21 w/ EF <20. RV mildly reduced. GIIIDD. Moderate TR. No LVH.  - [facility] 10/2018 showed normal cors, elevated filling pressures and low CI at 1.9  - Now admitted w/ NYHA IV symptoms, volume overload and worsening renal function, likely cardiorenal syndrome. SCr peaked to 2.97 (prior baseline 1.2).  - RHC 5/5 showed Low output biventricular failure and marked volume overload. PCWP 27. CI 2.0. - IABP placed 5/7 on 1:1 with good augmentation, removed 5/14.  - Volume status stable. On torsemide 40 mg twice a day.   - Co-ox 64% off milrinone. - No bb with low output.   - Renal function stable at 2.0 - Poor LVAD candidate with RV dysfunction, noncompliance and renal failure.   2. AKI - due to shock/cardiorenal - Creatinine peaked at 3 - SCr peaked at 2.4  - Todays creatinine 2.06 -> 1.99 -> 2.04 -> 2.03  3. Persistent Atrial Fibrillation w/ RVR - Recent DCCV attempt aborted 4/20 due to LAA thrombi on TEE - S/P DC-CV 06/30/19 with brief conversion to NSR but back in A fib - S/P repeat DCCV 5/13.  - Back in AF for > 24 hours. Rate well controlled. Continue amiodarone 200 mg q8H  - will avoid?blocker currently given low output and acute decompensation - Continue ranolazine 500 mg bid.  Watch QT - Continue digoxin 0.125 qd  - Now on Eliquis (can't use [name] with Ranexa per PharmD) - No bleeding - Will bolus with IV amio today. If does not convert plan repeat DC-CV in am and then d/c home   4. NSVT - in the setting of severe LV dysfunction and milrinone  - now quiescent - continue amiodarone - he has ICD - keep K >4.0 and Mg >2.0   5. Chronic Cholecystitis  - has perc biliary drain -  appreciate IR input  6. Hypokalemia - K 3.7. Will supp   7. Persistent Nausea  and Vomiting/Constipation  Resolved.    Length of Stay: 20  Glori Bickers, MD  8:45 AM

## 2019-07-13 NOTE — Progress Notes (Signed)
   07/13/19 1449  Clinical Encounter Type  Visited With Patient  Visit Type Follow-up;Other (Comment);Spiritual support (Advance Directive)  Referral From Palliative care team  Consult/Referral To Chaplain  Spiritual Encounters  Spiritual Needs Prayer  This chaplain assisted the Pt. in the completion of his Advance Directive.  The notary and two witnesses were present to witness the Pt. signature.  The Pt. named Glenn [name] as primary HCPOA. Glenn [name] was named the next choice if HCPOA is unable or unwilling.  The Pt. completed a Living Will.  The Pt was given the original Advance Directive and one copy.  A copy of the Advance Directive was placed in the Pt. chart.  The chaplain is available for F/U spiritual care as needed.

## 2019-07-13 NOTE — Progress Notes (Signed)
Nutrition Follow-up  DOCUMENTATION CODES:   Non-severe (moderate) malnutrition in context of chronic illness  INTERVENTION:   - Continue Boost Breeze po TID, each supplement provides 250 kcal and 9 grams of protein  - Continue Hormel Shake TID with meals, each supplement provides 520 kcal and 22 grams of protein  - Encourage adequate PO intake  NUTRITION DIAGNOSIS:   Moderate Malnutrition related to chronic illness (CHF, chronic cholecystitis) as evidenced by mild fat depletion, moderate fat depletion, mild muscle depletion, moderate muscle depletion, percent weight loss (27.5% weight loss in less than 9 months).  New diagnosis after completion of NFPE  GOAL:   Patient will meet greater than or equal to 90% of their needs  Progressing  MONITOR:   PO intake, Supplement acceptance, Labs, Weight trends, I & O's  REASON FOR ASSESSMENT:   Malnutrition Screening Tool    ASSESSMENT:   69 year old male who presented with percutaneous cholecystostomy tube dysfunction and SOB. PMH of hemorrhagic stroke, PAF, ischemic cardiomyopathy, DVT, HTN, HLD, chronic cholecystitis with percutaneous cholecystostomy drain in place. Pt found to be in heart failure with rapid atrial fibrillation.  5/05 - IR replacement of cholecystostomy drain, Swan placed 5/07 - s/p IABP insertion 5/11 - s/p TEE/DC-CV 5/13 - s/p repeat DC-CV 5/14 - IABP removed  Noted plan to bolus with IV amiodarone today. If pt does not convert, plan is for repeat DC-CV tomorrow morning then d/c home.  Spoke with pt at bedside. RN at bedside providing nursing care. Pt reports appetite is good and that he ate "nearly all" of breakfast this morning. Pt endorses drinking both Vital Cuisine shakes and drinking Boost Breeze supplements. He prefers the wild berry flavor. RN confirms that pt enjoys the [employer].  Current weight: 72.8 kg Admit weight: 79.4 kg  Reviewed weight readings from previous encounters. Pt with a  26.7 kg weight loss since 10/29/18. This is a 27.5% weight loss in less than 9 months which is severe and significant for timeframe. Suspect some of weight loss can be attributed to fluid shifts. However, suspect pt with malnutrition given severity of weight loss.  Pt endorses weight loss but is unsure why. RD discussed the importance of adequate PO intake in maintaining lean muscle mass and preventing additional weight loss. Pt expresses understanding.  Pt reports that at home, he eats 3 full meals daily and that his largest meal is at lunch.  Meal Completion: 0-100% x last 8 recorded meals  Medications reviewed and include: colace, Boost Breeze TID, Reglan, protonix, miralax, torsemide  Labs reviewed: BUN 43, creatinine 2.03  UOP: 1800 ml x 24 hours  NUTRITION - FOCUSED PHYSICAL EXAM:    Most Recent Value  Orbital Region  Mild depletion  Upper Arm Region  Moderate depletion  Thoracic and Lumbar Region  Mild depletion  Buccal Region  Mild depletion  Temple Region  Mild depletion  Clavicle Bone Region  Moderate depletion  Clavicle and Acromion Bone Region  Moderate depletion  Scapular Bone Region  Mild depletion  Dorsal Hand  Mild depletion  Patellar Region  Mild depletion  Anterior Thigh Region  Mild depletion  Posterior Calf Region  Mild depletion  Edema (RD Assessment)  Mild [BLE]  Hair  Reviewed  Eyes  Reviewed  Mouth  Reviewed  Skin  Reviewed  Nails  Reviewed       Diet Order:   Diet Order            Diet Heart Room service appropriate? Yes; Fluid  consistency: Thin  Diet effective now              EDUCATION NEEDS:   Education needs have been addressed  Skin:  Skin Assessment: Reviewed RN Assessment  Last BM:  07/12/19  Height:   Ht Readings from Last 1 Encounters:  07/02/19 5\' 7"  (1.702 m)    Weight:   Wt Readings from Last 1 Encounters:  07/13/19 72.8 kg    Ideal Body Weight:  67.3 kg  BMI:  Body mass index is 25.14 kg/m.  Estimated  Nutritional Needs:   Kcal:  1900-2100  Protein:  95-110 grams  Fluid:  1.8 L/day    Gaynell Face, MS, RD, LDN Inpatient Clinical Dietitian Pager: [phone] Weekend/After Hours: [phone]

## 2019-07-13 NOTE — Progress Notes (Signed)
Palliative:  HPI: 69 y.o. male  with past medical history of CHF EF < 20%, ICM, atrial fibrillation, VT, s/p Chemical engineer AICD, HTN, HLD, COPD, CKD stage 3a, cholecystotomy drain with chronic cholecystitis admitted on 06/23/2019 with palpitations due to atrial fibrillation with RVR as well as biliary drain dysfunction. He has required IABP 5/6-5/14, amiodarone and milrinone infusions along with DCCV 5/11 and 5/13. Overall with some slow improvement with attempts to wean milrinone. He has since been successfully weaned off milrinone and moved out of ICU.    I met again today with Glenn [name]. I assist him to return to bed from recliner. He is weak and takes his time but able to transfer with minimal assist except for assistance with equipment (telemetry and foley catheter bag). He reports that he is feeling well and pleased with his progress. He has no concerns except for generalized muscle pain in right upper leg he reports is from sitting in chair too long and warm blanket provided for relief. He continues to be hopeful for continued improvement. He reports that he has spoken with his cousin, Glenn [name], and he agrees to act as his HCPOA and Glenn [name] is interested in completing Living Will document - chaplain notified to assist with notarizing.   All questions/concerns addressed. Emotional support provided.   Exam: Alert, oriented. Flat affect. Appears fatigued. HR regular rate. Breathing regular, unlabored. Abd flat.   Plan: - Hopeful for continued improvement.  - Plans to complete Living Will/HCPOA today.   25 min  Vinie Sill, NP Palliative Medicine Team Pager [phone] (Please see [email or web address] for schedule) Team Phone [phone]    Greater than 50%  of this time was spent counseling and coordinating care related to the above assessment and plan

## 2019-07-14 ENCOUNTER — Inpatient Hospital Stay (HOSPITAL_COMMUNITY): Payer: Medicare (Managed Care) | Admitting: Certified Registered Nurse Anesthetist

## 2019-07-14 ENCOUNTER — Encounter (HOSPITAL_COMMUNITY): Payer: Self-pay | Admitting: Family Medicine

## 2019-07-14 ENCOUNTER — Encounter (HOSPITAL_COMMUNITY)
Admission: EM | Disposition: A | Discharge status: Skilled Nursing Facility | Payer: Self-pay | Source: Home / Self Care | Attending: Internal Medicine

## 2019-07-14 DIAGNOSIS — E440 Moderate protein-calorie malnutrition: Secondary | ICD-10-CM | POA: Insufficient documentation

## 2019-07-14 HISTORY — PX: CARDIOVERSION: SHX1299

## 2019-07-14 LAB — BASIC METABOLIC PANEL
Anion gap: 13 (ref 5–15)
BUN: 40 mg/dL — ABNORMAL HIGH (ref 8–23)
CO2: 31 mmol/L (ref 22–32)
Calcium: 9.1 mg/dL (ref 8.9–10.3)
Chloride: 93 mmol/L — ABNORMAL LOW (ref 98–111)
Creatinine, Ser: 2.05 mg/dL — ABNORMAL HIGH (ref 0.61–1.24)
GFR calc Af Amer: 38 mL/min — ABNORMAL LOW (ref >60)
GFR calc non Af Amer: 33 mL/min — ABNORMAL LOW (ref >60)
Glucose, Bld: 109 mg/dL — ABNORMAL HIGH (ref 70–99)
Potassium: 3.6 mmol/L (ref 3.5–5.1)
Sodium: 137 mmol/L (ref 135–145)

## 2019-07-14 LAB — COOXEMETRY PANEL
Carboxyhemoglobin: 2 % — ABNORMAL HIGH (ref 0.5–1.5)
Methemoglobin: 1.2 % (ref 0.0–1.5)
O2 Saturation: 54.8 %
Total hemoglobin: 10.5 g/dL — ABNORMAL LOW (ref 12.0–16.0)

## 2019-07-14 LAB — PROTIME-INR
INR: 1.5 — ABNORMAL HIGH (ref 0.8–1.2)
Prothrombin Time: 17.9 seconds — ABNORMAL HIGH (ref 11.4–15.2)

## 2019-07-14 LAB — SARS CORONAVIRUS 2 (TAT 6-24 HRS): SARS Coronavirus 2: NEGATIVE

## 2019-07-14 SURGERY — CARDIOVERSION
Anesthesia: General

## 2019-07-14 MED ORDER — SODIUM CHLORIDE 0.9 % IV SOLN: INTRAVENOUS | Status: DC | PRN

## 2019-07-14 MED ORDER — LIDOCAINE 2% (20 MG/ML) 5 ML SYRINGE
INTRAMUSCULAR | Status: DC | PRN
  Administered 2019-07-14: 60 mg via INTRAVENOUS

## 2019-07-14 MED ORDER — PROPOFOL 10 MG/ML IV BOLUS
INTRAVENOUS | Status: DC | PRN
  Administered 2019-07-14: 50 mg via INTRAVENOUS

## 2019-07-14 NOTE — Progress Notes (Addendum)
Patient ID: Glenn [name], male   DOB: [birth year], 69 y.o.   MRN: [id number]    Advanced Heart Failure Rounding Note   Subjective:    06/25/19 IABP placed 06/30/19  TEE/DC-CV with conversion to NSR but went back in A fib.  07/02/19 Had repeat DCCV.  07/03/19 IABP removed 5/17 back in AF 5/18 back in NSR   Milrinone stopped 5/20.   CO-OX 55%. Remains in A fib.   Denies SOB.     Objective:   Weight Range:  Vital Signs:   Temp:  [97.5 F (36.4 C)-98.8 F (37.1 C)] 98 F (36.7 C) (05/25 0712) Pulse Rate:  [65-98] 91 (05/25 0712) Resp:  [15-21] 16 (05/25 0726) BP: (115-137)/(48-88) 120/57 (05/25 0712) SpO2:  [94 %-100 %] 94 % (05/25 0712) Weight:  [72.2 kg] 72.2 kg (05/25 0453) Last BM Date: 07/13/19  Weight change: Filed Weights   07/12/19 0415 07/13/19 0500 07/14/19 0453  Weight: 69.4 kg 72.8 kg 72.2 kg    Intake/Output:   Intake/Output Summary (Last 24 hours) at 07/14/2019 0744 Last data filed at 07/14/2019 0503 Gross per 24 hour  Intake 94.4 ml  Output 2830 ml  Net -2735.6 ml     Physical Exam: General:  No resp difficulty HEENT: normal Neck: supple. no JVD. Carotids 2+ bilat; no bruits. No lymphadenopathy or thryomegaly appreciated. Cor: PMI nondisplaced. Irregular rate & rhythm. No rubs, gallops or murmurs. Lungs: clear Abdomen: soft, nontender, nondistended. No hepatosplenomegaly. No bruits or masses. Good bowel sounds. Extremities: no cyanosis, clubbing, rash, edema Neuro: alert & orientedx3, cranial nerves grossly intact. moves all 4 extremities w/o difficulty. Affect pleasant   Telemetry:   A fib 80-90s 90s   Labs: Basic Metabolic Panel: Recent Labs  Lab 07/08/19 0428 07/08/19 1412 07/09/19 0442 07/09/19 0442 07/10/19 0400 07/10/19 0400 07/11/19 0338 07/11/19 0338 07/12/19 0444 07/13/19 0300 07/14/19 0518  NA  --    < > 131*   < > 131*  --  134*  --  132* 135 137  K  --    < > 5.1   < > 4.6  --  4.2  --  3.7 3.7 3.6  CL  --    < >  92*   < > 90*  --  93*  --  93* 89* 93*  CO2  --    < > 30   < > 29  --  31  --  31 30 31   GLUCOSE  --    < > 125*   < > 99  --  106*  --  108* 110* 109*  BUN  --    < > 25*   < > 30*  --  36*  --  41* 43* 40*  CREATININE  --    < > 1.95*   < > 2.06*  --  1.99*  --  2.04* 2.03* 2.05*  CALCIUM  --    < > 8.9   < > 9.1   < > 9.0   < > 9.0 9.0 9.1  MG 1.7  --  2.0  --   --   --   --   --   --   --   --    < > = values in this interval not displayed.    Liver Function Tests: No results for input(s): AST, ALT, ALKPHOS, BILITOT, PROT, ALBUMIN in the last 168 hours. No results for input(s): LIPASE, AMYLASE in the last 168 hours. No  results for input(s): AMMONIA in the last 168 hours.  CBC: Recent Labs  Lab 07/08/19 0734  WBC 4.9  HGB 9.7*  HCT 30.4*  MCV 92.1  PLT 227    Cardiac Enzymes: No results for input(s): CKTOTAL, CKMB, CKMBINDEX, TROPONINI in the last 168 hours.  BNP: BNP (last 3 results) Recent Labs    02/03/19 2007 06/01/19 1413 06/23/19 1527  BNP 922.2* 531.4* 613.2*    ProBNP (last 3 results) No results for input(s): PROBNP in the last 8760 hours.    Other results:  Imaging: No results found.   Medications:     Scheduled Medications: . amiodarone  200 mg Oral Q8H  . apixaban  5 mg Oral BID  . atorvastatin  20 mg Oral Daily  . Chlorhexidine Gluconate Cloth  6 each Topical Daily  . digoxin  0.125 mg Oral Daily  . docusate sodium  100 mg Oral BID  . feeding supplement  1 Container Oral TID BM  . metoCLOPramide  5 mg Oral TID  . pantoprazole  40 mg Oral Daily  . polyethylene glycol  17 g Oral Daily  . ranolazine  500 mg Oral BID  . sertraline  25 mg Oral Daily  . sodium chloride flush  3 mL Intravenous Q12H  . torsemide  40 mg Oral BID    Infusions: . norepinephrine (LEVOPHED) Adult infusion Stopped (07/01/19 0656)    PRN Medications: acetaminophen **OR** acetaminophen, ALPRAZolam, calcium carbonate, levalbuterol, lidocaine (PF), ondansetron  **OR** ondansetron (ZOFRAN) IV, sodium chloride flush, sorbitol, traMADol, traZODone   Assessment/Plan:   1. Acute on Chronic Combined Systolic and Diastolic Heart Failure -> cardiogenic shock - 2/2 NICM. HF dates back to 2015.  - Most recent echo 4/21 w/ EF <20. RV mildly reduced. GIIIDD. Moderate TR. No LVH.  - [facility] 10/2018 showed normal cors, elevated filling pressures and low CI at 1.9  - Now admitted w/ NYHA IV symptoms, volume overload and worsening renal function, likely cardiorenal syndrome. SCr peaked to 2.97 (prior baseline 1.2).  - RHC 5/5 showed Low output biventricular failure and marked volume overload. PCWP 27. CI 2.0. - IABP placed 5/7 on 1:1 with good augmentation, removed 5/14.  - Volume status stable. Continue torsemide 40 mg twice a day.   - CO-OX 55%. - No bb with low output.   - Renal function stable at 2.0 - Poor LVAD candidate with RV dysfunction, noncompliance and renal failure.   2. AKI - due to shock/cardiorenal - Creatinine peaked at 3 - SCr peaked at 2.4  - Todays creatinine 2.05, stable.   3. Persistent Atrial Fibrillation w/ RVR - Recent DCCV attempt aborted 4/20 due to LAA thrombi on TEE - S/P DC-CV 06/30/19 with brief conversion to NSR but back in A fib - S/P repeat DCCV 5/13.  - Back in AF for > 24 hours. Rate well controlled. Continue amiodarone 200 mg q8H  - will avoid?blocker currently given low output and acute decompensation - Continue ranolazine 500 mg bid.  Watch QT - Continue digoxin 0.125 qd  - Now on Eliquis (can't use [name] with Ranexa per PharmD) - No bleeding - Remains  In a fib. Set up for cardioversion today.   4. NSVT - in the setting of severe LV dysfunction and milrinone  - now quiescent - continue amiodarone - he has ICD - keep K >4.0 and Mg >2.0   5. Chronic Cholecystitis  - has perc biliary drain - appreciate IR input  6. Hypokalemia - K  3.6  Will supp   7. Persistent Nausea and Vomiting/Constipation    Resolved.   NPO for possible cardioversion.   Consult SNF for disposition   Length of Stay: [place], NP  7:44 AM   Patient seen and examined with the above-signed Advanced Practice Provider and/or Housestaff. I personally reviewed laboratory data, imaging studies and relevant notes. I independently examined the patient and formulated the important aspects of the plan. I have edited the note to reflect any of my changes or salient points. I have personally discussed the plan with the patient and/or family.  He remains in AF despite IV amio bolus yesterday. Volume status ok. Co-ox marginal but stable. Creatinine stable. No bleeding on Eliquis.  General:  Well appearing. No resp difficulty HEENT: normal Neck: supple. no JVD. Carotids 2+ bilat; no bruits. No lymphadenopathy or thryomegaly appreciated. Cor: PMI nondisplaced. Irregular rate & rhythm. No rubs, gallops or murmurs. Lungs: clear Abdomen: soft, nontender, nondistended. No hepatosplenomegaly. No bruits or masses. Good bowel sounds. + biliary drain Extremities: no cyanosis, clubbing, rash, edema Neuro: alert & orientedx3, cranial nerves grossly intact. moves all 4 extremities w/o difficulty. Affect pleasant  Will need DC-CV today if possible Once back in NSR can be discharged but now requiring SNF due to weakness and lack of home support. Continue current HF regimen as well as amio and Eliquis.   Glori Bickers, MD  8:30 AM

## 2019-07-14 NOTE — TOC Initial Note (Signed)
Transition of Care [facility]) - Initial/Assessment Note    Patient Details  Name: Glenn [name] MRN: [id number] Date of Birth: 04-01-1950  Transition of Care [facility]) CM/SW Contact:    Alberteen Sam, LCSW Phone Number: 07/14/2019, 9:52 AM  Clinical Narrative:                  CSW spoke with patient regarding SNF recommendation made by PT, he reports he is in agreement to go to SNF for short term rehab. Patient gave CSW permission to fax out referrals in [place] area for bed offers.   Pending bed offers at this time, once bed is identified will need insurance auth and updated covid test.   Expected Discharge Plan: Skilled Nursing Facility Barriers to Discharge: Continued Medical Work up, SNF Pending bed offer   Patient Goals and CMS Choice Patient states their goals for this hospitalization and ongoing recovery are:: to go to SNF for short term rehab CMS Medicare.gov Compare Post Acute Care list provided to:: Patient Choice offered to / list presented to : Patient  Expected Discharge Plan and Services Expected Discharge Plan: [place]   Discharge Planning Services: CM Consult(Pt states sometimes refills aren't available @ [facility], outof stock) Post Acute Care Choice: [place] Living arrangements for the past 2 months: Single Family Home                 DME Arranged: N/A DME Agency: NA       HH Arranged: RN [place] Agency: [facility] Date [place]: 07/07/19 Time [place]: [place] Representative spoke with at [place]: [place] Arrangements/Services Living arrangements for the past 2 months: [place] with:: Self, Adult Children Patient language and need for interpreter reviewed:: Yes Do you feel safe going back to the place where you live?: No   needs short term rehab  Need for Family Participation in Patient Care: No (Comment) Care giver support system in place?: No (comment)    Criminal Activity/Legal Involvement Pertinent to Current Situation/Hospitalization: No - Comment as needed  Activities of Daily Living Home Assistive Devices/Equipment: None ADL Screening (condition at time of admission) Patient's cognitive ability adequate to safely complete daily activities?: Yes Is the patient deaf or have difficulty hearing?: No Does the patient have difficulty seeing, even when wearing glasses/contacts?: No Does the patient have difficulty concentrating, remembering, or making decisions?: No Patient able to express need for assistance with ADLs?: Yes Does the patient have difficulty dressing or bathing?: No Independently performs ADLs?: Yes (appropriate for developmental age) Does the patient have difficulty walking or climbing stairs?: No Weakness of Legs: Left Weakness of Arms/Hands: Left  Permission Sought/Granted Permission sought to share information with : Case Manager, Investment banker, corporate granted to share info w AGENCY: SNFs        Emotional Assessment Appearance:: Appears stated age Attitude/Demeanor/Rapport: Gracious Affect (typically observed): Calm Orientation: : Oriented to Self, Oriented to Place, Oriented to  Time, Oriented to Situation Alcohol / Substance Use: Not Applicable Psych Involvement: No (comment)  Admission diagnosis:  Acute cholecystitis [K81.0] Atrial fibrillation with RVR ([place]) [I48.91] Patient Active Problem List   Diagnosis Date Noted  . Malnutrition of moderate degree 07/14/2019  . Palliative care by specialist   . Atrial fibrillation with RVR ([place]) 06/23/2019  . Chronic systolic heart failure ([place]) 06/21/2019  . Persistent atrial fibrillation with RVR ([place])   . Thrombus  of left atrial appendage   . Left leg pain 03/27/2019  . History of hemorrhagic stroke with residual hemiparesis ([place]) 02/04/2019  . Left leg DVT ([place]) 02/04/2019  . Cholecystitis 02/04/2019  . Acute deep vein thrombosis  (DVT) of both lower extremities ([name])   . Acute blood loss anemia   . Labile blood glucose   . AKI (acute kidney injury) ([place])   . Chronic right hip pain   . Acute renal failure ([place])   . Headache due to intracranial disease 11/14/2018  . Trochanteric bursitis, right hip 11/14/2018  . Cerebral edema ([place]) 11/13/2018  . Hyperlipidemia LDL goal <70 11/13/2018  . Marijuana user 11/13/2018  . Right middle cerebral artery stroke ([place]) 11/13/2018  . Acute CVA (cerebrovascular accident) ([place])   . Noncompliance   . Dysphagia, post-stroke   . Acute on chronic combined systolic and diastolic CHF (congestive heart failure) ([place])   . DCM (dilated cardiomyopathy) ([place])   . Atrial fibrillation ([place]) 11/04/2018  . Elevated troponin   . Acute respiratory failure with hypoxia ([place])   . Acute kidney injury ([place])   . Entrapment of right ulnar nerve 02/28/2018  . Carpal tunnel syndrome of right wrist 02/28/2018  . Impotence due to erectile dysfunction 09/30/2017  . Solitary pulmonary nodule 06/10/2017  . Neck pain 04/06/2017  . Paroxysmal atrial fibrillation ([name])   . ICD (implantable cardioverter-defibrillator) in place 09/13/2016  . Chest pain 09/13/2016  . Tobacco abuse 09/13/2016  . Hospital discharge follow-up 09/13/2016  . Upper back pain 09/13/2016  . Housing problems 09/13/2016  . Ischemic cardiomyopathy   . Cerebral infarction ([place])   . Stroke (cerebrum) ([name]) Lg L MCA infarct w/ hemorrhagic conversion, embolic d/t AF 70/17/7939  . HTN (hypertension) 08/14/2016  . Chronic Hepatitis C  08/14/2016   PCP:  [name], MD Pharmacy:   [facility] [place], [place] - [place] AT [place] [address] [place] [address] Phone: [phone] Fax: [phone]     Social Determinants of Health (SDOH) Interventions    Readmission Risk Interventions Readmission Risk Prevention Plan 02/05/2019  Transportation Screening  Complete  Medication Review ([place]) Complete  PCP or Specialist appointment within 3-5 days of discharge Complete  HRI or [place] Complete  SW Recovery Care/Counseling Consult Complete  [place] Not Applicable  Some recent data might be hidden

## 2019-07-14 NOTE — CV Procedure (Signed)
    DIRECT CURRENT CARDIOVERSION  NAME:  Glenn [name]   MRN: [id number] DOB:  11-27-1950   ADMIT DATE: 06/23/2019   INDICATIONS: Atrial fibrillation    PROCEDURE:   Informed consent was obtained prior to the procedure. The risks, benefits and alternatives for the procedure were discussed and the patient comprehended these risks. Once an appropriate time out was taken, the patient had the defibrillator pads placed in the anterior and posterior position. The patient then underwent sedation by the anesthesia service. Once an appropriate level of sedation was achieved, the patient received a single biphasic, synchronized 200J shock with prompt conversion to sinus rhythm. No apparent complications.  Glori Bickers, MD  1:58 PM

## 2019-07-14 NOTE — Anesthesia Preprocedure Evaluation (Addendum)
Anesthesia Evaluation  Patient identified by MRN, date of birth, ID band Patient awake    Reviewed: Allergy & Precautions, H&P , NPO status , Patient's Chart, lab work & pertinent test results  Airway Mallampati: II  TM Distance: >3 FB Neck ROM: Full    Dental no notable dental hx. (+) Poor Dentition, Dental Advisory Given   Pulmonary neg pulmonary ROS, Patient abstained from smoking., former smoker,    Pulmonary exam normal breath sounds clear to auscultation       Cardiovascular hypertension, Pt. on medications and Pt. on home beta blockers +CHF  + dysrhythmias Atrial Fibrillation  Rhythm:Irregular Rate:Normal     Neuro/Psych  Headaches, CVA negative psych ROS   GI/Hepatic negative GI ROS, (+) Hepatitis -  Endo/Other  negative endocrine ROS  Renal/GU Renal disease  negative genitourinary   Musculoskeletal   Abdominal   Peds  Hematology  (+) Blood dyscrasia, anemia ,   Anesthesia Other Findings   Reproductive/Obstetrics negative OB ROS                            Anesthesia Physical Anesthesia Plan  ASA: IV  Anesthesia Plan: General   Post-op Pain Management:    Induction: Intravenous  PONV Risk Score and Plan: 2 and Propofol infusion and Treatment may vary due to age or medical condition  Airway Management Planned: Mask  Additional Equipment:   Intra-op Plan:   Post-operative Plan:   Informed Consent: I have reviewed the patients History and Physical, chart, labs and discussed the procedure including the risks, benefits and alternatives for the proposed anesthesia with the patient or authorized representative who has indicated his/her understanding and acceptance.     Dental advisory given  Plan Discussed with: CRNA  Anesthesia Plan Comments:         Anesthesia Quick Evaluation

## 2019-07-14 NOTE — H&P (View-Only) (Signed)
Patient ID: Glenn [name], male   DOB: 1951-02-15, 69 y.o.   MRN: [id number]    Advanced Heart Failure Rounding Note   Subjective:    06/25/19 IABP placed 06/30/19  TEE/DC-CV with conversion to NSR but went back in A fib.  07/02/19 Had repeat DCCV.  07/03/19 IABP removed 5/17 back in AF 5/18 back in NSR   Milrinone stopped 5/20.   CO-OX 55%. Remains in A fib.   Denies SOB.     Objective:   Weight Range:  Vital Signs:   Temp:  [97.5 F (36.4 C)-98.8 F (37.1 C)] 98 F (36.7 C) (05/25 0712) Pulse Rate:  [65-98] 91 (05/25 0712) Resp:  [15-21] 16 (05/25 0726) BP: (115-137)/(48-88) 120/57 (05/25 0712) SpO2:  [94 %-100 %] 94 % (05/25 0712) Weight:  [72.2 kg] 72.2 kg (05/25 0453) Last BM Date: 07/13/19  Weight change: Filed Weights   07/12/19 0415 07/13/19 0500 07/14/19 0453  Weight: 69.4 kg 72.8 kg 72.2 kg    Intake/Output:   Intake/Output Summary (Last 24 hours) at 07/14/2019 0744 Last data filed at 07/14/2019 0503 Gross per 24 hour  Intake 94.4 ml  Output 2830 ml  Net -2735.6 ml     Physical Exam: General:  No resp difficulty HEENT: normal Neck: supple. no JVD. Carotids 2+ bilat; no bruits. No lymphadenopathy or thryomegaly appreciated. Cor: PMI nondisplaced. Irregular rate & rhythm. No rubs, gallops or murmurs. Lungs: clear Abdomen: soft, nontender, nondistended. No hepatosplenomegaly. No bruits or masses. Good bowel sounds. Extremities: no cyanosis, clubbing, rash, edema Neuro: alert & orientedx3, cranial nerves grossly intact. moves all 4 extremities w/o difficulty. Affect pleasant   Telemetry:   A fib 80-90s 90s   Labs: Basic Metabolic Panel: Recent Labs  Lab 07/08/19 0428 07/08/19 1412 07/09/19 0442 07/09/19 0442 07/10/19 0400 07/10/19 0400 07/11/19 0338 07/11/19 0338 07/12/19 0444 07/13/19 0300 07/14/19 0518  NA  --    < > 131*   < > 131*  --  134*  --  132* 135 137  K  --    < > 5.1   < > 4.6  --  4.2  --  3.7 3.7 3.6  CL  --    < >  92*   < > 90*  --  93*  --  93* 89* 93*  CO2  --    < > 30   < > 29  --  31  --  31 30 31   GLUCOSE  --    < > 125*   < > 99  --  106*  --  108* 110* 109*  BUN  --    < > 25*   < > 30*  --  36*  --  41* 43* 40*  CREATININE  --    < > 1.95*   < > 2.06*  --  1.99*  --  2.04* 2.03* 2.05*  CALCIUM  --    < > 8.9   < > 9.1   < > 9.0   < > 9.0 9.0 9.1  MG 1.7  --  2.0  --   --   --   --   --   --   --   --    < > = values in this interval not displayed.    Liver Function Tests: No results for input(s): AST, ALT, ALKPHOS, BILITOT, PROT, ALBUMIN in the last 168 hours. No results for input(s): LIPASE, AMYLASE in the last 168 hours. No  results for input(s): AMMONIA in the last 168 hours.  CBC: Recent Labs  Lab 07/08/19 0734  WBC 4.9  HGB 9.7*  HCT 30.4*  MCV 92.1  PLT 227    Cardiac Enzymes: No results for input(s): CKTOTAL, CKMB, CKMBINDEX, TROPONINI in the last 168 hours.  BNP: BNP (last 3 results) Recent Labs    02/03/19 2007 06/01/19 1413 06/23/19 1527  BNP 922.2* 531.4* 613.2*    ProBNP (last 3 results) No results for input(s): PROBNP in the last 8760 hours.    Other results:  Imaging: No results found.   Medications:     Scheduled Medications: . amiodarone  200 mg Oral Q8H  . apixaban  5 mg Oral BID  . atorvastatin  20 mg Oral Daily  . Chlorhexidine Gluconate Cloth  6 each Topical Daily  . digoxin  0.125 mg Oral Daily  . docusate sodium  100 mg Oral BID  . feeding supplement  1 Container Oral TID BM  . metoCLOPramide  5 mg Oral TID  . pantoprazole  40 mg Oral Daily  . polyethylene glycol  17 g Oral Daily  . ranolazine  500 mg Oral BID  . sertraline  25 mg Oral Daily  . sodium chloride flush  3 mL Intravenous Q12H  . torsemide  40 mg Oral BID    Infusions: . norepinephrine (LEVOPHED) Adult infusion Stopped (07/01/19 0656)    PRN Medications: acetaminophen **OR** acetaminophen, ALPRAZolam, calcium carbonate, levalbuterol, lidocaine (PF), ondansetron  **OR** ondansetron (ZOFRAN) IV, sodium chloride flush, sorbitol, traMADol, traZODone   Assessment/Plan:   1. Acute on Chronic Combined Systolic and Diastolic Heart Failure -> cardiogenic shock - 2/2 NICM. HF dates back to 2015.  - Most recent echo 4/21 w/ EF <20. RV mildly reduced. GIIIDD. Moderate TR. No LVH.  - [facility] 10/2018 showed normal cors, elevated filling pressures and low CI at 1.9  - Now admitted w/ NYHA IV symptoms, volume overload and worsening renal function, likely cardiorenal syndrome. SCr peaked to 2.97 (prior baseline 1.2).  - RHC 5/5 showed Low output biventricular failure and marked volume overload. PCWP 27. CI 2.0. - IABP placed 5/7 on 1:1 with good augmentation, removed 5/14.  - Volume status stable. Continue torsemide 40 mg twice a day.   - CO-OX 55%. - No bb with low output.   - Renal function stable at 2.0 - Poor LVAD candidate with RV dysfunction, noncompliance and renal failure.   2. AKI - due to shock/cardiorenal - Creatinine peaked at 3 - SCr peaked at 2.4  - Todays creatinine 2.05, stable.   3. Persistent Atrial Fibrillation w/ RVR - Recent DCCV attempt aborted 4/20 due to LAA thrombi on TEE - S/P DC-CV 06/30/19 with brief conversion to NSR but back in A fib - S/P repeat DCCV 5/13.  - Back in AF for > 24 hours. Rate well controlled. Continue amiodarone 200 mg q8H  - will avoid?blocker currently given low output and acute decompensation - Continue ranolazine 500 mg bid.  Watch QT - Continue digoxin 0.125 qd  - Now on Eliquis (can't use [name] with Ranexa per PharmD) - No bleeding - Remains  In a fib. Set up for cardioversion today.   4. NSVT - in the setting of severe LV dysfunction and milrinone  - now quiescent - continue amiodarone - he has ICD - keep K >4.0 and Mg >2.0   5. Chronic Cholecystitis  - has perc biliary drain - appreciate IR input  6. Hypokalemia - K  3.6  Will supp   7. Persistent Nausea and Vomiting/Constipation    Resolved.   NPO for possible cardioversion.   Consult SNF for disposition   Length of Stay: [place], NP  7:44 AM   Patient seen and examined with the above-signed Advanced Practice Provider and/or Housestaff. I personally reviewed laboratory data, imaging studies and relevant notes. I independently examined the patient and formulated the important aspects of the plan. I have edited the note to reflect any of my changes or salient points. I have personally discussed the plan with the patient and/or family.  He remains in AF despite IV amio bolus yesterday. Volume status ok. Co-ox marginal but stable. Creatinine stable. No bleeding on Eliquis.  General:  Well appearing. No resp difficulty HEENT: normal Neck: supple. no JVD. Carotids 2+ bilat; no bruits. No lymphadenopathy or thryomegaly appreciated. Cor: PMI nondisplaced. Irregular rate & rhythm. No rubs, gallops or murmurs. Lungs: clear Abdomen: soft, nontender, nondistended. No hepatosplenomegaly. No bruits or masses. Good bowel sounds. + biliary drain Extremities: no cyanosis, clubbing, rash, edema Neuro: alert & orientedx3, cranial nerves grossly intact. moves all 4 extremities w/o difficulty. Affect pleasant  Will need DC-CV today if possible Once back in NSR can be discharged but now requiring SNF due to weakness and lack of home support. Continue current HF regimen as well as amio and Eliquis.   Glori Bickers, MD  8:30 AM

## 2019-07-14 NOTE — NC FL2 (Signed)
[place] LEVEL OF CARE SCREENING TOOL     IDENTIFICATION  Patient Name: Glenn [name] Birthdate: 01-Dec-1950 Sex: male Admission Date (Current Location): 06/23/2019  [facility] and [place] Number:  Herbalist and Address:  [facility] [place]. [facility], [place] [address] [place], [place] [address]      Provider Number: [id number]  Attending Physician Name and Address:  Jolaine Artist, MD  Relative Name and Phone Number:  [name] (son) [phone]    Current Level of Care: Hospital Recommended Level of Care: [place] Prior Approval Number:    Date Approved/Denied:   PASRR Number: [id number]  Discharge Plan: SNF    Current Diagnoses: Patient Active Problem List   Diagnosis Date Noted  . Malnutrition of moderate degree 07/14/2019  . Palliative care by specialist   . Atrial fibrillation with RVR ([place]) 06/23/2019  . Chronic systolic heart failure ([place]) 06/21/2019  . Persistent atrial fibrillation with RVR ([place])   . Thrombus of left atrial appendage   . Left leg pain 03/27/2019  . History of hemorrhagic stroke with residual hemiparesis ([place]) 02/04/2019  . Left leg DVT ([place]) 02/04/2019  . Cholecystitis 02/04/2019  . Acute deep vein thrombosis (DVT) of both lower extremities ([name])   . Acute blood loss anemia   . Labile blood glucose   . AKI (acute kidney injury) ([place])   . Chronic right hip pain   . Acute renal failure ([place])   . Headache due to intracranial disease 11/14/2018  . Trochanteric bursitis, right hip 11/14/2018  . Cerebral edema ([place]) 11/13/2018  . Hyperlipidemia LDL goal <70 11/13/2018  . Marijuana user 11/13/2018  . Right middle cerebral artery stroke ([place]) 11/13/2018  . Acute CVA (cerebrovascular accident) ([place])   . Noncompliance   . Dysphagia, post-stroke   . Acute on chronic combined systolic and diastolic CHF (congestive heart failure) ([place])   . DCM (dilated cardiomyopathy) ([place])   . Atrial  fibrillation ([place]) 11/04/2018  . Elevated troponin   . Acute respiratory failure with hypoxia ([place])   . Acute kidney injury ([place])   . Entrapment of right ulnar nerve 02/28/2018  . Carpal tunnel syndrome of right wrist 02/28/2018  . Impotence due to erectile dysfunction 09/30/2017  . Solitary pulmonary nodule 06/10/2017  . Neck pain 04/06/2017  . Paroxysmal atrial fibrillation ([name])   . ICD (implantable cardioverter-defibrillator) in place 09/13/2016  . Chest pain 09/13/2016  . Tobacco abuse 09/13/2016  . Hospital discharge follow-up 09/13/2016  . Upper back pain 09/13/2016  . Housing problems 09/13/2016  . Ischemic cardiomyopathy   . Cerebral infarction ([place])   . Stroke (cerebrum) ([name]) Lg L MCA infarct w/ hemorrhagic conversion, embolic d/t AF 47/65/4650  . HTN (hypertension) 08/14/2016  . Chronic Hepatitis C  08/14/2016    Orientation RESPIRATION BLADDER Height & Weight     Self, Time, Situation, Place  Normal Continent, External catheter Weight: 159 lb 2.8 oz (72.2 kg) Height:  5\' 7"  (170.2 cm)  BEHAVIORAL SYMPTOMS/MOOD NEUROLOGICAL BOWEL NUTRITION STATUS      Continent Diet(see discharge summary)  AMBULATORY STATUS COMMUNICATION OF NEEDS Skin   Limited Assist Verbally Other (Comment)(right abdomen catheter)                       Personal Care Assistance Level of Assistance  Bathing, Feeding, Dressing, Total care Bathing Assistance: Limited assistance Feeding assistance: Independent Dressing Assistance: Limited assistance Total Care Assistance: Limited assistance   Functional Limitations  Info  Sight, Hearing, Speech Sight Info: Adequate Hearing Info: Adequate Speech Info: Adequate    SPECIAL CARE FACTORS FREQUENCY  PT (By licensed PT), OT (By licensed OT)     PT Frequency: min 5x weekly OT Frequency: min 5x weekly            Contractures Contractures Info: Not present    Additional Factors Info  Code Status, Allergies Code Status Info:  full Allergies Info: Benadryl (diphenhydramine)           Current Medications (07/14/2019):  This is the current hospital active medication list Current Facility-Administered Medications  Medication Dose Route Frequency Provider Last Rate Last Admin  . acetaminophen (TYLENOL) tablet 650 mg  650 mg Oral Q6H PRN Consuelo Pandy, PA-C   650 mg at 07/08/19 1455   Or  . acetaminophen (TYLENOL) suppository 650 mg  650 mg Rectal Q6H PRN Consuelo Pandy, PA-C      . ALPRAZolam Duanne Moron) tablet 0.25 mg  0.25 mg Oral BID PRN Lyda Jester M, PA-C   0.25 mg at 07/13/19 1413  . amiodarone (PACERONE) tablet 200 mg  200 mg Oral Q8H Simmons, Brittainy M, PA-C   200 mg at 07/14/19 9323  . apixaban (ELIQUIS) tablet 5 mg  5 mg Oral BID Lyda Jester M, PA-C   5 mg at 07/13/19 2148  . atorvastatin (LIPITOR) tablet 20 mg  20 mg Oral Daily Lyda Jester M, PA-C   20 mg at 07/13/19 1055  . calcium carbonate (TUMS - dosed in mg elemental calcium) chewable tablet 200 mg of elemental calcium  1 tablet Oral Daily PRN Lyda Jester M, PA-C   200 mg of elemental calcium at 07/05/19 2128  . Chlorhexidine Gluconate Cloth 2 % PADS 6 each  6 each Topical Daily Consuelo Pandy, PA-C   6 each at 07/11/19 1000  . digoxin (LANOXIN) tablet 0.125 mg  0.125 mg Oral Daily Lyda Jester M, PA-C   0.125 mg at 07/13/19 1056  . docusate sodium (COLACE) capsule 100 mg  100 mg Oral BID Lyda Jester M, PA-C   100 mg at 07/13/19 2148  . feeding supplement (BOOST / RESOURCE BREEZE) liquid 1 Container  1 Container Oral TID BM Consuelo Pandy, PA-C   1 Container at 07/13/19 2148  . levalbuterol (XOPENEX) nebulizer solution 0.63 mg  0.63 mg Nebulization Q6H PRN Lyda Jester M, PA-C      . lidocaine (PF) (XYLOCAINE) 1 % injection    PRN Arne Cleveland, MD   5 mL at 06/24/19 0955  . metoCLOPramide (REGLAN) tablet 5 mg  5 mg Oral TID Lyda Jester M, PA-C   5 mg at 07/13/19 2147  .  norepinephrine (LEVOPHED) 4mg  in 221mL premix infusion  0-10 mcg/min Intravenous Titrated Consuelo Pandy, PA-C   Stopped at 07/01/19 [phone]  . ondansetron (ZOFRAN) tablet 4 mg  4 mg Oral Q6H PRN Lyda Jester M, PA-C       Or  . ondansetron [facility]) injection 4 mg  4 mg Intravenous Q6H PRN Lyda Jester M, PA-C   4 mg at 07/04/19 1005  . pantoprazole (PROTONIX) EC tablet 40 mg  40 mg Oral Daily Lyda Jester M, PA-C   40 mg at 07/13/19 1055  . polyethylene glycol (MIRALAX / GLYCOLAX) packet 17 g  17 g Oral Daily Lyda Jester M, PA-C   17 g at 07/13/19 1056  . ranolazine (RANEXA) 12 hr tablet 500 mg  500 mg Oral BID Lyda Jester  M, PA-C   500 mg at 07/13/19 2147  . sertraline (ZOLOFT) tablet 25 mg  25 mg Oral Daily Lyda Jester M, PA-C   25 mg at 07/13/19 1055  . sodium chloride flush (NS) 0.9 % injection 10-40 mL  10-40 mL Intracatheter PRN Consuelo Pandy, PA-C   10 mL at 07/13/19 2148  . sodium chloride flush (NS) 0.9 % injection 3 mL  3 mL Intravenous Q12H Lyda Jester M, PA-C   3 mL at 07/13/19 2149  . sorbitol 70 % solution 30 mL  30 mL Oral Daily PRN Consuelo Pandy, PA-C   30 mL at 07/04/19 1010  . torsemide (DEMADEX) tablet 40 mg  40 mg Oral BID Clegg, Amy D, NP   40 mg at 07/14/19 3299  . traMADol (ULTRAM) tablet 50 mg  50 mg Oral Q8H PRN Consuelo Pandy, PA-C   50 mg at 07/11/19 2143  . traZODone (DESYREL) tablet 50 mg  50 mg Oral QHS PRN Lyda Jester M, PA-C   50 mg at 07/12/19 2104     Discharge Medications: Please see discharge summary for a list of discharge medications.  Relevant Imaging Results:  Relevant Lab Results:   Additional Information SSN: [id number]  Alberteen Sam, LCSW

## 2019-07-14 NOTE — Progress Notes (Signed)
Came to ambulate however pt sleeping and not interested in ambulation. Left low sodium diet sheets. Will f/u tomorrow. [phone] [place], ACSM 1:03 PM 07/14/2019

## 2019-07-14 NOTE — Anesthesia Postprocedure Evaluation (Signed)
Anesthesia Post Note  Patient: Glenn [name]  Procedure(s) Performed: CARDIOVERSION (N/A )     Patient location during evaluation: Endoscopy Anesthesia Type: General Level of consciousness: awake and alert Pain management: pain level controlled Vital Signs Assessment: post-procedure vital signs reviewed and stable Respiratory status: spontaneous breathing, nonlabored ventilation and respiratory function stable Cardiovascular status: blood pressure returned to baseline and stable Postop Assessment: no apparent nausea or vomiting Anesthetic complications: no    Last Vitals:  Vitals:   07/14/19 1330 07/14/19 1337  BP: (!) 115/57 (!) 126/56  Pulse: 72 74  Resp: 14 17  Temp:    SpO2: 99% 96%    Last Pain:  Vitals:   07/14/19 1337  TempSrc:   PainSc: 0-No pain                 FITZGERALD,W. EDMOND

## 2019-07-14 NOTE — Plan of Care (Signed)

## 2019-07-14 NOTE — Progress Notes (Signed)
  Obtain COVID swab now in anticipation of discharge to SNF.   Kiyan Burmester [place]oso NP-C  11:27 AM

## 2019-07-14 NOTE — Transfer of Care (Signed)
Immediate Anesthesia Transfer of Care Note  Patient: Glenn [name]  Procedure(s) Performed: CARDIOVERSION (N/A )  Patient Location: Endoscopy Unit  Anesthesia Type:General  Level of Consciousness: awake and alert   Airway & Oxygen Therapy: Patient Spontanous Breathing  Post-op Assessment: Report given to RN and Post -op Vital signs reviewed and stable  Post vital signs: Reviewed and stable  Last Vitals:  Vitals Value Taken Time  BP    Temp    Pulse    Resp    SpO2      Last Pain:  Vitals:   07/14/19 1240  TempSrc: Oral  PainSc: 0-No pain      Patients Stated Pain Goal: 0 (33/38/32 9191)  Complications: No apparent anesthesia complications

## 2019-07-14 NOTE — Anesthesia Procedure Notes (Signed)
Procedure Name: General with mask airway Date/Time: 07/14/2019 12:55 PM Performed by: Harden Mo, CRNA Pre-anesthesia Checklist: Patient identified, Emergency Drugs available, Suction available and Patient being monitored Patient Re-evaluated:Patient Re-evaluated prior to induction Oxygen Delivery Method: Ambu bag Preoxygenation: Pre-oxygenation with 100% oxygen Induction Type: IV induction Placement Confirmation: positive ETCO2 and breath sounds checked- equal and bilateral Dental Injury: Teeth and Oropharynx as per pre-operative assessment

## 2019-07-14 NOTE — Interval H&P Note (Signed)
History and Physical Interval Note:  07/14/2019 1:57 PM  Glenn [name]  has presented today for surgery, with the diagnosis of afib.  The various methods of treatment have been discussed with the patient and family. After consideration of risks, benefits and other options for treatment, the patient has consented to  Procedure(s): CARDIOVERSION (N/A) as a surgical intervention.  The patient's history has been reviewed, patient examined, no change in status, stable for surgery.  I have reviewed the patient's chart and labs.  Questions were answered to the patient's satisfaction.     [name]

## 2019-07-14 NOTE — Discharge Summary (Addendum)
Advanced Heart Failure Team  Discharge Summary   Patient ID: Glenn [name] MRN: [id number], DOB/AGE: 04/06/1950 69 y.o. Admit date: 06/23/2019 D/C date:     07/16/2019   Primary Discharge Diagnoses:  A/C Combined Systolic and Diastolic Heart Failure-->Cardiogenic Shock  AKI  Persistent  A fib RVR NSVT Chronic Cholecystis  Per Chole Tube, in place since 10/2018 Hypokalemia  Persistent Nausea/Vomiting/Consitipation Non-severe (moderate) malnutrition in context of chronic illness Goal of Care   Hospital Course:  Glenn [name] is a 69 year old with a history of chronic systolic HF 2/2 NICM s/p ICD, atrial fibrillation, h/o stroke (Lg L MCA infarct w/ hemorrhagic conversion in 2018), h/o DVT, chornic a/c therapy, HTN, HLD, pulmonary nodule, Hepatitis C, carpal tunnel,  tobacco abuse (smokes ~1/4 ppd) and chronic cholecystitis. He has chronic perc drain.   Admitted with A/C systolic heart failure and AKI. Hospital course complicated by cardiogenic shock requiring IABP for support in addition to milrinone. IABP gradually weaned followed by milrinone. Diurese with IV lasix and later transitioned to torsemide  40  mg twice a day.   Developed A fib RVR and was placed amiodarone and underwent multiple cardioversions. EP consulted for  Afib. Plan to continue ranexa and amiodarone.   Palliative Care consulted to assist with goals of care. HCPOA completed and Glenn [name] is interested in completing Living Will document - chaplain notified to assist with notarizing. Plan to continue aggressive care.  PT/OT consulted for weakness. Recommendations for SNF. SW consulted to asist with placement.   He will continue to be followed closely in the HF clinic.    1. Acute on Chronic Combined Systolic and Diastolic Heart Failure -> cardiogenic shock - 2/2 NICM. HF dates back to 2015.  - Most recent echo 4/21 w/ EF <20. RV mildly reduced. GIIIDD. Moderate TR. No LVH.  - [facility] 10/2018 showed normal cors, elevated  filling pressures and low CI at 1.9  - Admitted with NYHA IV symptoms, volume overload and worsening renal function, likely cardiorenal syndrome. SCr peaked to 2.97 (prior baseline 1.2).  - RHC 5/5 showed Low output biventricular failure and marked volume overload. PCWP 27. CI 2.0.  - IABP placed 5/7 for cardiogenic shock. Gradually improved with IABP removed on 07/03/19. - Diuresed with IV lasix and transitioned to torsemide 40 mg twice a day.   - No bb with low output. No  - Renal function stable.  - Poor LVAD candidate with RV dysfunction, noncompliance and renal failure.    2. AKI - due to shock/cardiorenal - Creatinine peaked at 3 - Creatinine gradually improved. odays creatinine 2.05, stable.    3. Persistent Atrial Fibrillation w/ RVR - Recent DCCV attempt aborted 4/20 due to LAA thrombi on TEE - S/P DC-CV 06/30/19 with brief conversion to NSR but back in A fib - S/P repeat DCCV 5/13.  - Back in AF for > 24 hours. Rate well controlled. Continue amiodarone 200 mg q8H  - will avoid ? blocker currently given low output and acute decompensation - Had another DCCV on 07/14/19 --> NSR  - Continue ranolazine 500 mg bid.   - Continue digoxin 0.125 qd  - Continue eliquis 5 mg twice a day. (can't use [name] with Ranexa per PharmD)    4. NSVT - in the setting of severe LV dysfunction and milrinone  - now quiescent - continue amiodarone - he has ICD - keep K >4.0 and Mg >2.0     5. Chronic Cholecystitis  - has chronic perc  chole tube. Will need to drain as needed.  - appreciate IR input.  - IR -->To our knowledge he has not had follow up with surgery but is not currently a surgical candidate due his cardiac issues.Unfortunately, because he has gallstones, he is at high risk for recurrent cholecystitis if the drain is removed. The drain needs to remain and he is already in our system for routine scheduled cholangiogram/drain exchanges.   6. Hypokalemia - K 3.6  Will supp    7.  Persistent Nausea and Vomiting/Constipation  Resolved. Suspect in the setting of low output heart failure  8. Non-severe (moderate) malnutrition in context of chronic illness Dietitian consulted and followed with recommendations   9. Deconditioning , Muscle weakness, Difficulty walking  PT/OT consulted with recommendations for SNF.   Discharge Vitals: Blood pressure (!) 125/48, pulse 77, temperature 97.9 F (36.6 C), temperature source Oral, resp. rate 19, height 5\' 7"  (1.702 m), weight 68.6 kg, SpO2 99 %.   General:  No resp difficulty HEENT: normal Neck: supple. no JVD. Carotids 2+ bilat; no bruits. No lymphadenopathy or thryomegaly appreciated. Cor: PMI nondisplaced. Regular rate & rhythm. No rubs, gallops or murmurs. Lungs: clear Abdomen: soft, nontender, nondistended. No hepatosplenomegaly. No bruits or masses. Good bowel sounds. RLQ perc chole tube Extremities: no cyanosis, clubbing, rash, edema Neuro: alert & orientedx3, cranial nerves grossly intact. moves all 4 extremities w/o difficulty. Affect pleasant   Labs: Lab Results  Component Value Date   WBC 4.9 07/08/2019   HGB 9.7 (L) 07/08/2019   HCT 30.4 (L) 07/08/2019   MCV 92.1 07/08/2019   PLT 227 07/08/2019    Recent Labs  Lab 07/16/19 0433  NA 135  K 3.7  CL 94*  CO2 31  BUN 41*  CREATININE 2.10*  CALCIUM 9.1  GLUCOSE 109*   Lab Results  Component Value Date   CHOL 135 11/11/2018   HDL 24 (L) 11/11/2018   LDLCALC 96 11/11/2018   TRIG 75 11/11/2018   BNP (last 3 results) Recent Labs    02/03/19 2007 06/01/19 1413 06/23/19 1527  BNP 922.2* 531.4* 613.2*    ProBNP (last 3 results) No results for input(s): PROBNP in the last 8760 hours.   Diagnostic Studies/Procedures   No results found.  Discharge Medications   Allergies as of 07/16/2019      Reactions   Benadryl [diphenhydramine] Palpitations      Medication List    STOP taking these medications   albuterol 108 (90 Base) MCG/ACT  inhaler Commonly known as: VENTOLIN HFA   Atrovent HFA 17 MCG/ACT inhaler Generic drug: ipratropium   dabigatran 150 MG Caps capsule Commonly known as: [name]   furosemide 40 MG tablet Commonly known as: LASIX   metoprolol succinate 100 MG 24 hr tablet Commonly known as: TOPROL-XL   sacubitril-valsartan 24-26 MG Commonly known as: ENTRESTO   spironolactone 25 MG tablet Commonly known as: ALDACTONE     TAKE these medications   acetaminophen 325 MG tablet Commonly known as: TYLENOL Take 2 tablets (650 mg total) by mouth every 6 (six) hours as needed for mild pain (or Fever >/= 101). What changed:   when to take this  reasons to take this   ALPRAZolam 0.25 MG tablet Commonly known as: XANAX Take 1 tablet (0.25 mg total) by mouth 2 (two) times daily as needed for anxiety.   amiodarone 200 MG tablet Commonly known as: PACERONE Take 1 tablet (200 mg total) by mouth every 8 (eight) hours.  apixaban 5 MG Tabs tablet Commonly known as: ELIQUIS Take 1 tablet (5 mg total) by mouth 2 (two) times daily.   atorvastatin 20 MG tablet Commonly known as: LIPITOR TAKE 1 TABLET(20 MG) BY MOUTH DAILY AT 6 PM What changed: See the new instructions.   calcium carbonate 500 MG chewable tablet Commonly known as: TUMS - dosed in mg elemental calcium Chew 1 tablet (200 mg of elemental calcium total) by mouth daily as needed for indigestion or heartburn.   digoxin 0.125 MG tablet Commonly known as: LANOXIN Take 1 tablet (0.125 mg total) by mouth daily.   docusate sodium 100 MG capsule Commonly known as: COLACE Take 1 capsule (100 mg total) by mouth 2 (two) times daily.   levalbuterol 0.63 MG/3ML nebulizer solution Commonly known as: XOPENEX Take 3 mLs (0.63 mg total) by nebulization every 6 (six) hours as needed for wheezing or shortness of breath.   metoCLOPramide 5 MG tablet Commonly known as: REGLAN Take 1 tablet (5 mg total) by mouth 3 (three) times daily.   ondansetron  4 MG tablet Commonly known as: ZOFRAN Take 1 tablet (4 mg total) by mouth every 6 (six) hours as needed for nausea.   pantoprazole 40 MG tablet Commonly known as: PROTONIX Take 1 tablet (40 mg total) by mouth daily. What changed: See the new instructions.   potassium chloride SA 20 MEQ tablet Commonly known as: KLOR-CON Take 2 tablets (40 mEq total) by mouth daily. Start taking on: Jul 17, 2019 What changed:   how much to take  how to take this  when to take this  additional instructions   ranolazine 500 MG 12 hr tablet Commonly known as: RANEXA Take 1 tablet (500 mg total) by mouth 2 (two) times daily.   sertraline 25 MG tablet Commonly known as: ZOLOFT Take 1 tablet (25 mg total) by mouth daily.   torsemide 20 MG tablet Commonly known as: DEMADEX Take 2 tablets (40 mg total) by mouth 2 (two) times daily.   traMADol 50 MG tablet Commonly known as: ULTRAM Take 1 tablet (50 mg total) by mouth every 8 (eight) hours as needed for severe pain.       Disposition   The patient will be discharged in stable condition to Contact information for after-discharge care    Destination    [facility] .   Service: Skilled Nursing Contact information: [address] [place] [phone]             SNF [facility]     Duration of Discharge Encounter: Greater than 35 minutes   Signed, Darrick Grinder NP-C  07/16/2019, 11:00 AM   Patient seen and examined with the above-signed Advanced Practice Provider and/or Housestaff. I personally reviewed laboratory data, imaging studies and relevant notes. I independently examined the patient and formulated the important aspects of the plan. I have edited the note to reflect any of my changes or salient points. I have personally discussed the plan with the patient and/or family.  He is maintaining NSR. Volume status looks good. No bleeding on Eliquis. Renal function stable.    General:  Well appearing. No resp difficulty HEENT: normal Neck: supple. no JVD. Carotids 2+ bilat; no bruits. No lymphadenopathy or thryomegaly appreciated. Cor: PMI nondisplaced. Regular rate & rhythm. No rubs, gallops or murmurs. Lungs: clear Abdomen: soft, nontender, nondistended. No hepatosplenomegaly. No bruits or masses. Good bowel sounds. + biliary drain Extremities: no cyanosis, clubbing, rash, edema Neuro:  alert & orientedx3, cranial nerves grossly intact. moves all 4 extremities w/o difficulty. Affect pleasant  Stable for d/c today. Meds reviewed. Continue amio and Eliquis. Management of biliary drain per IR   Glori Bickers, MD  11:08 AM

## 2019-07-15 DIAGNOSIS — I4819 Other persistent atrial fibrillation: Secondary | ICD-10-CM

## 2019-07-15 LAB — BASIC METABOLIC PANEL
Anion gap: 10 (ref 5–15)
BUN: 40 mg/dL — ABNORMAL HIGH (ref 8–23)
CO2: 31 mmol/L (ref 22–32)
Calcium: 9 mg/dL (ref 8.9–10.3)
Chloride: 94 mmol/L — ABNORMAL LOW (ref 98–111)
Creatinine, Ser: 2.04 mg/dL — ABNORMAL HIGH (ref 0.61–1.24)
GFR calc Af Amer: 38 mL/min — ABNORMAL LOW (ref >60)
GFR calc non Af Amer: 33 mL/min — ABNORMAL LOW (ref >60)
Glucose, Bld: 107 mg/dL — ABNORMAL HIGH (ref 70–99)
Potassium: 3.6 mmol/L (ref 3.5–5.1)
Sodium: 135 mmol/L (ref 135–145)

## 2019-07-15 LAB — COOXEMETRY PANEL
Carboxyhemoglobin: 2.1 % — ABNORMAL HIGH (ref 0.5–1.5)
Methemoglobin: 0.6 % (ref 0.0–1.5)
O2 Saturation: 73.3 %
Total hemoglobin: 9.1 g/dL — ABNORMAL LOW (ref 12.0–16.0)

## 2019-07-15 MED ORDER — POTASSIUM CHLORIDE CRYS ER 20 MEQ PO TBCR
40.0000 meq | EXTENDED_RELEASE_TABLET | Freq: Every day | ORAL | Status: DC
  Administered 2019-07-15 – 2019-07-16 (×2): 40 meq via ORAL
  Filled 2019-07-15 (×2): qty 2

## 2019-07-15 NOTE — Progress Notes (Addendum)
Changed bilary drain dressing, there are discharge from insertion area and clean up today. Patient has drooling saliva since last Saturday. Patient can swallow food or liquid, but patient said that he doesn't want to swallow saliva due to making nausea. NP Amy made aware of it. Patient has hands tremors and then stopped. It happened since one month ago. Continue to monitor. HS [employer]

## 2019-07-15 NOTE — Progress Notes (Signed)
Occupational Therapy Treatment Patient Details Name: Glenn [name] MRN: [id number] DOB: 10-May-1950 Today's Date: 07/15/2019    History of present illness 69 y.o. presented and admitted 06/24/19 SOB and perc cholecystotomy tube dysfunction. S/p  R heart cath, central line insertion 06/24/19,  IABP insertion 06/25/19.  06/30/19  TEE/DC-CV with conversion to NSR but went back in A fib. 07/02/19 Had repeat DCCV. 07/03/19 IABP removal. 5/17 back in afib,  5/18 back in NSR. PMHx: hemorrhagic stroke with left weakness, PAF, Ischemic cardiomyopathy, DVT, HTN, HLD   OT comments  Pt progressing well with therapy. Pt asking for increased time to complete tasks; pt requiring cues or directions repeated. OT session focused on ADL at sink and initiating energy conservation techniques. Pt not feeling well and spitting a lot/lots of drool coming from L side fo face. Pt not able to control it- RN aware. VSS. Pt reporting dizziness/not feeling well in sidelying: 126/46; pt 136/59 in sitting- dizziness resolving. OT following POC and following acutely. SNF still appropriate disposition as pt fatigues very easily.    Follow Up Recommendations  SNF    Equipment Recommendations  3 in 1 bedside commode    Recommendations for Other Services      Precautions / Restrictions Precautions Precautions: Fall Precaution Comments: cholecystectomy drain Restrictions Weight Bearing Restrictions: No       Mobility Bed Mobility Overal bed mobility: Modified Independent                Transfers Overall transfer level: Needs assistance Equipment used: Rolling walker (2 wheeled) Transfers: Sit to/from [employer] Transfers Sit to Stand: Min guard Stand pivot transfers: Min guard       General transfer comment: cues for hand placement    Balance Overall balance assessment: Needs assistance Sitting-balance support: No upper extremity supported;Feet supported Sitting balance-Leahy Scale: Good      Standing balance support: Single extremity supported;During functional activity Standing balance-Leahy Scale: Poor Standing balance comment: reliant on counter for stability in standing at sink                           ADL either performed or assessed with clinical judgement   ADL Overall ADL's : Needs assistance/impaired Eating/Feeding: Modified independent;Sitting   Grooming: Supervision/safety;Sitting;Standing;Cueing for safety;Cueing for sequencing Grooming Details (indicate cue type and reason): no physical assist required; stayed close due to excessive saliva and pt's frequent need to sit down.             Lower Body Dressing: Minimal assistance;Sitting/lateral leans;Sit to/from stand;Cueing for safety Lower Body Dressing Details (indicate cue type and reason): leaning down to fix socks at EOB Toilet Transfer: Min guard;Cueing for safety;Ambulation;BSC;RW           Functional mobility during ADLs: Min guard;Rolling walker;Cueing for safety General ADL Comments: session focused on ADL at sink and initiating energy conservation techniques. pt not feeling well and spitting a lot/lots of drool coming from L side fo face. Pt not able to control it- RN aware.     Vision   Vision Assessment?: No apparent visual deficits   Perception     Praxis      Cognition Arousal/Alertness: Awake/alert Behavior During Therapy: WFL for tasks assessed/performed Overall Cognitive Status: Impaired/Different from baseline Area of Impairment: Problem solving                             Problem Solving:  Slow processing General Comments: Pt asking for increased time to complete tasks; pt requiring cues or directions repeated.        Exercises     Shoulder Instructions       General Comments VSS. Pt reporting dizziness/not feeling well in sidelying: 126/46; pt 136/59 in sitting- dizziness resolving.     Pertinent Vitals/ Pain       Pain Assessment:  Faces Faces Pain Scale: Hurts little more Pain Location: L sided drain placement Pain Descriptors / Indicators: Discomfort Pain Intervention(s): Limited activity within patient's tolerance  Home Living                                          Prior Functioning/Environment              Frequency  Min 2X/week        Progress Toward Goals  OT Goals(current goals can now be found in the care plan section)  Progress towards OT goals: Progressing toward goals  Acute Rehab OT Goals Patient Stated Goal: return to independence OT Goal Formulation: With patient Time For Goal Achievement: 07/25/19 Potential to Achieve Goals: Good ADL Goals Pt Will Perform Lower Body Dressing: with min assist;sitting/lateral leans;sit to/from stand Pt Will Transfer to Toilet: with supervision;ambulating;regular height toilet;grab bars Pt/caregiver will Perform Home Exercise Program: Increased strength;Both right and left upper extremity;With written HEP provided Additional ADL Goal #1: Pt will recall and apply 3-5 ECS strategies to BADL routine  Plan Discharge plan remains appropriate    Co-evaluation                 AM-PAC OT "6 Clicks" Daily Activity     Outcome Measure   Help from another person eating meals?: None Help from another person taking care of personal grooming?: A Little Help from another person toileting, which includes using toliet, bedpan, or urinal?: A Little Help from another person bathing (including washing, rinsing, drying)?: A Little Help from another person to put on and taking off regular upper body clothing?: None Help from another person to put on and taking off regular lower body clothing?: A Lot 6 Click Score: 19    End of Session Equipment Utilized During Treatment: Gait belt;Rolling walker  OT Visit Diagnosis: Unsteadiness on feet (R26.81);Other abnormalities of gait and mobility (R26.89);Muscle weakness (generalized) (M62.81)    Activity Tolerance Patient tolerated treatment well   Patient Left in bed;with call bell/phone within reach;with bed alarm set;Other (comment)(Rn aware that pt seated at EOB eating lunch with alarm on)   Nurse Communication Mobility status        Time: 7673-4193 OT Time Calculation (min): 34 min  Charges: OT General Charges $OT Visit: 1 Visit OT Treatments $Self Care/Home Management : 8-22 mins $Therapeutic Activity: 8-22 mins  Jefferey Pica, OTR/L Acute Rehabilitation Services Pager: [phone] Office: [phone]    ALLISON C 07/15/2019, 4:26 PM

## 2019-07-15 NOTE — Progress Notes (Addendum)
Patient ID: Glenn [name], male   DOB: 1950-03-28, 69 y.o.   MRN: [id number]    Advanced Heart Failure Rounding Note   Subjective:    06/25/19 IABP placed 06/30/19  TEE/DC-CV with conversion to NSR but went back in A fib.  07/02/19 Had repeat DCCV.  07/03/19 IABP removed 5/17 back in AF 5/18 back in NSR  07/14/19 S/P DC-CV--> NSR  Feeling ok today. Denies SOB.    Objective:   Weight Range:  Vital Signs:   Temp:  [97.8 F (36.6 C)-99.1 F (37.3 C)] 97.9 F (36.6 C) (05/26 0751) Pulse Rate:  [71-102] 71 (05/26 0751) Resp:  [13-19] 13 (05/26 0751) BP: (113-138)/(48-86) 119/58 (05/26 0751) SpO2:  [96 %-100 %] 98 % (05/26 0751) Weight:  [65.5 kg] 65.5 kg (05/26 0647) Last BM Date: 07/13/19  Weight change: Filed Weights   07/13/19 0500 07/14/19 0453 07/15/19 0647  Weight: 72.8 kg 72.2 kg 65.5 kg    Intake/Output:   Intake/Output Summary (Last 24 hours) at 07/15/2019 0933 Last data filed at 07/15/2019 0500 Gross per 24 hour  Intake 5 ml  Output 1160 ml  Net -1155 ml     Physical Exam: General:  Sitting in the chair. No resp difficulty HEENT: normal Neck: supple. no JVD. Carotids 2+ bilat; no bruits. No lymphadenopathy or thryomegaly appreciated. Cor: PMI nondisplaced. Regular rate & rhythm. No rubs, gallops or murmurs. Lungs: clear Abdomen: soft, nontender, nondistended. No hepatosplenomegaly. No bruits or masses. Good bowel sounds. C-Tube  Extremities: no cyanosis, clubbing, rash, edema Neuro: alert & orientedx3, cranial nerves grossly intact. moves all 4 extremities w/o difficulty. Affect pleasant  Telemetry:  NSR 70s with occasional PVCs.  Labs: Basic Metabolic Panel: Recent Labs  Lab 07/09/19 0442 07/10/19 0400 07/11/19 0338 07/11/19 0338 07/12/19 0444 07/12/19 0444 07/13/19 0300 07/14/19 0518 07/15/19 0526  NA 131*   < > 134*  --  132*  --  135 137 135  K 5.1   < > 4.2  --  3.7  --  3.7 3.6 3.6  CL 92*   < > 93*  --  93*  --  89* 93* 94*  CO2 30    < > 31  --  31  --  30 31 31   GLUCOSE 125*   < > 106*  --  108*  --  110* 109* 107*  BUN 25*   < > 36*  --  41*  --  43* 40* 40*  CREATININE 1.95*   < > 1.99*  --  2.04*  --  2.03* 2.05* 2.04*  CALCIUM 8.9   < > 9.0   < > 9.0   < > 9.0 9.1 9.0  MG 2.0  --   --   --   --   --   --   --   --    < > = values in this interval not displayed.    Liver Function Tests: No results for input(s): AST, ALT, ALKPHOS, BILITOT, PROT, ALBUMIN in the last 168 hours. No results for input(s): LIPASE, AMYLASE in the last 168 hours. No results for input(s): AMMONIA in the last 168 hours.  CBC: No results for input(s): WBC, NEUTROABS, HGB, HCT, MCV, PLT in the last 168 hours.  Cardiac Enzymes: No results for input(s): CKTOTAL, CKMB, CKMBINDEX, TROPONINI in the last 168 hours.  BNP: BNP (last 3 results) Recent Labs    02/03/19 2007 06/01/19 1413 06/23/19 1527  BNP 922.2* 531.4* 613.2*  ProBNP (last 3 results) No results for input(s): PROBNP in the last 8760 hours.    Other results:  Imaging: No results found.   Medications:     Scheduled Medications: . amiodarone  200 mg Oral Q8H  . apixaban  5 mg Oral BID  . atorvastatin  20 mg Oral Daily  . Chlorhexidine Gluconate Cloth  6 each Topical Daily  . digoxin  0.125 mg Oral Daily  . docusate sodium  100 mg Oral BID  . feeding supplement  1 Container Oral TID BM  . metoCLOPramide  5 mg Oral TID  . pantoprazole  40 mg Oral Daily  . polyethylene glycol  17 g Oral Daily  . potassium chloride  40 mEq Oral Daily  . ranolazine  500 mg Oral BID  . sertraline  25 mg Oral Daily  . sodium chloride flush  3 mL Intravenous Q12H  . torsemide  40 mg Oral BID    Infusions:   PRN Medications: acetaminophen **OR** acetaminophen, ALPRAZolam, calcium carbonate, levalbuterol, lidocaine (PF), ondansetron **OR** ondansetron (ZOFRAN) IV, sodium chloride flush, sorbitol, traMADol, traZODone   Assessment/Plan:   1. Acute on Chronic Combined  Systolic and Diastolic Heart Failure -> cardiogenic shock - 2/2 NICM. HF dates back to 2015.  - Most recent echo 4/21 w/ EF <20. RV mildly reduced. GIIIDD. Moderate TR. No LVH.  - [facility] 10/2018 showed normal cors, elevated filling pressures and low CI at 1.9  - Now admitted w/ NYHA IV symptoms, volume overload and worsening renal function, likely cardiorenal syndrome. SCr peaked to 2.97 (prior baseline 1.2).  - RHC 5/5 showed Low output biventricular failure and marked volume overload. PCWP 27. CI 2.0. - IABP placed 5/7 on 1:1 with good augmentation, removed 5/14.  - Volume status stable. Continue torsemide 40 mg twice a day.   - CO-OX stable 73%.  - No bb with low output.   - Renal function stable at 2.0 - Poor LVAD candidate with RV dysfunction, noncompliance and renal failure.   2. AKI - due to shock/cardiorenal - Creatinine peaked at 3 - SCr peaked at 2.4  - Creatinine stable at 2.04. .   3. Persistent Atrial Fibrillation w/ RVR - Recent DCCV attempt aborted 4/20 due to LAA thrombi on TEE - S/P DC-CV 06/30/19 with brief conversion to NSR but back in A fib - S/P repeat DCCV 5/13.  - Back in AF for > 24 hours. Rate well controlled. Continue amiodarone 200 mg q8H  - will avoid?blocker currently given low output and acute decompensation - Continue ranolazine 500 mg bid.  Watch QT - Continue digoxin 0.125 qd  - Now on Eliquis (can't use [name] with Ranexa per PharmD) - S/P DC-CV 5/25 with restoration of NSR.  - So far maintaining NSR.   4. NSVT - in the setting of severe LV dysfunction and milrinone  - now quiescent - continue amiodarone - he has ICD - keep K >4.0 and Mg >2.0   5. Chronic Cholecystitis  - has perc biliary drain since September 2020  - Continue to drain as needed.  - appreciate IR input  6. Hypokalemia - Supp K   7. Persistent Nausea and Vomiting/Constipation  Resolved.   COVID negative for  SNF placement. TO SNF when bed available.   His son was  requesting a call from HF Team however Glenn [name] said he did not want [place] to call him. He has not told his son that he is going to Rehab. I have  asked him to let his son know.     Length of Stay: [place], NP  9:33 AM   Agree with above. Back in NSR after repeat DC-CV yesterday. Volume status ok. Co-ox 73%. No bleeding on Eliquis,   Looks good on exam.  No JVD. NSR Lungs clear + biliary drain site ok  Ext warm no edema.   Awaiting SNF placement. Continue current meds. Will need close f/u with HF and EP. We will need to figure out if he is candidate or AF ablation and/or we should consider placement of LV lead while he is improved so that he would be candidate for AV Node ablation should he decompensate again.   Glori Bickers, MD  5:31 PM

## 2019-07-15 NOTE — Progress Notes (Signed)
Checked with pt twice earlier today, declined ambulation and has been in the bed, c/o queasiness. Pt to walk with PT later therefore discussed HF booklet and management. Pt reception. He knows to weigh daily, call MD with s/s, and follow 2000 mg sodium diet. Encouraged ambulation x3 qd. He has goals to get back to walking with his cane.  [place], ACSM 3:12 PM 07/15/2019

## 2019-07-15 NOTE — Progress Notes (Signed)
Physical Therapy Treatment Patient Details Name: Glenn [name] MRN: [id number] DOB: 1950-07-08 Today's Date: 07/15/2019    History of Present Illness 69 y.o. presented and admitted 06/24/19 SOB and perc cholecystotomy tube dysfunction. S/p  R heart cath, central line insertion 06/24/19,  IABP insertion 06/25/19.  06/30/19  TEE/DC-CV with conversion to NSR but went back in A fib. 07/02/19 Had repeat DCCV. 07/03/19 IABP removal. 5/17 back in afib,  5/18 back in NSR. DCCV 07/14/19. PMHx: hemorrhagic stroke with left weakness, PAF, Ischemic cardiomyopathy, DVT, HTN, HLD    PT Comments    Pt required min(A) with  L LE to sit EOB and sit to stand min guard. Pt amb increased distance min guard with RW, VSS. Pt reported increased "drooling" from mouth, observed increased salivation and notified nurse. Completed x4 sit to stand for LE therapeutic exercise, pt was able to complete min guard with verbal cues for trunk extension. Discharge plan remains appropriate, will continue to follow acutely until d/c to next venue of care.   Follow Up Recommendations  SNF;Supervision for mobility/OOB     Equipment Recommendations  None recommended by PT    Recommendations for Other Services       Precautions / Restrictions Precautions Precautions: Fall Precaution Comments: cholecystectomy drain Restrictions Weight Bearing Restrictions: No    Mobility  Bed Mobility Overal bed mobility: Needs Assistance Bed Mobility: Supine to Sit     Supine to sit: Min assist     General bed mobility comments: Pt required min assist with supine to EOB for assistance of left leg to EOB  Transfers Overall transfer level: Needs assistance Equipment used: Rolling walker (2 wheeled) Transfers: Sit to/from Stand Sit to Stand: Min guard Stand pivot transfers: Min guard       General transfer comment: cues for hand placment, increased time  Ambulation/Gait Ambulation/Gait assistance: Min guard Gait Distance  (Feet): 170 Feet Assistive device: Rolling walker (2 wheeled) Gait Pattern/deviations: Step-through pattern;Decreased stride length Gait velocity: decreased   General Gait Details: Pt able to walk 141ft with 2 standing rest breaks. Pt required cues for trunk elevation with amb.   Stairs             Wheelchair Mobility    Modified Rankin (Stroke Patients Only)       Balance Overall balance assessment: Needs assistance Sitting-balance support: No upper extremity supported;Feet supported Sitting balance-Leahy Scale: Good     Standing balance support: Single extremity supported;During functional activity Standing balance-Leahy Scale: Poor Standing balance comment: Pt reliant on RW for mobility                            Cognition Arousal/Alertness: Awake/alert Behavior During Therapy: Flat affect Overall Cognitive Status: Within Functional Limits for tasks assessed Area of Impairment: Problem solving                             Problem Solving: Slow processing General Comments: Pt asking for increased time to complete tasks; pt requiring cues or directions repeated.      Exercises Other Exercises Other Exercises: Sit to stand x4    General Comments General comments (skin integrity, edema, etc.): Pt reports increased "drooling" from mouth, notable increases since last time patient was seen by this therapist, notified nurse.      Pertinent Vitals/Pain Pain Assessment: No/denies pain Faces Pain Scale: Hurts little more Pain Location: L sided drain  placement Pain Descriptors / Indicators: Discomfort Pain Intervention(s): Limited activity within patient's tolerance    Home Living                      Prior Function            PT Goals (current goals can now be found in the care plan section) Acute Rehab PT Goals Patient Stated Goal: return to independence Time For Goal Achievement: 07/25/19 Potential to Achieve Goals:  Good Progress towards PT goals: Progressing toward goals    Frequency    Min 3X/week      PT Plan Current plan remains appropriate    Co-evaluation              AM-PAC PT "6 Clicks" Mobility   Outcome Measure  Help needed turning from your back to your side while in a flat bed without using bedrails?: None Help needed moving from lying on your back to sitting on the side of a flat bed without using bedrails?: A Little Help needed moving to and from a bed to a chair (including a wheelchair)?: A Little Help needed standing up from a chair using your arms (e.g., wheelchair or bedside chair)?: A Little Help needed to walk in hospital room?: A Little Help needed climbing 3-5 steps with a railing? : A Little 6 Click Score: 19    End of Session Equipment Utilized During Treatment: Gait belt Activity Tolerance: Patient tolerated treatment well Patient left: in chair;with call bell/phone within reach;with chair alarm set Nurse Communication: Mobility status PT Visit Diagnosis: Unsteadiness on feet (R26.81);Other abnormalities of gait and mobility (R26.89);Muscle weakness (generalized) (M62.81);Difficulty in walking, not elsewhere classified (R26.2)     Time: 1610-9604 PT Time Calculation (min) (ACUTE ONLY): 35 min  Charges:  $Gait Training: 8-22 mins $Therapeutic Exercise: 8-22 mins                     [employer] SPT 07/15/2019    Rolland Porter 07/15/2019, 5:41 PM

## 2019-07-15 NOTE — TOC Progression Note (Addendum)
Transition of Care [facility]) - Progression Note    Patient Details  Name: Glenn [name] MRN: [id number] Date of Birth: January 31, 1951  [facility] CM/SW [place], Clar[place] Phone Number: 07/15/2019, 12:56 PM  Clinical Narrative:     CSW spoke with patient at bedside. Patient chose bed at [facility] for SNF placement.CSW also spoke with [facility] and they confirmed placement for patient. [name] with [employer] started insurance authorization for patient. Covid is good until 28th. Patient gave CSW permission to discuss his care with [name] his son.  Patient has bed at [facility]. Insurance authorization is pending.CSW will continue to follow.   Expected Discharge Plan: [place] Barriers to Discharge: Continued Medical Work up, SNF Pending bed offer  Expected Discharge Plan and Services Expected Discharge Plan: [place]   Discharge Planning Services: CM Consult(Pt states sometimes refills aren't available @ [facility], outof stock) Post Acute Care Choice: [place] arrangements for the past 2 months: Single Family Home                 DME Arranged: N/A DME Agency: NA       HH Arranged: RN [place] Agency: [facility] Date [place]: 07/07/19 Time [place]: [place] Representative spoke with at [place]: [place] ([place]) Interventions    Readmission Risk Interventions Readmission Risk Prevention Plan 02/05/2019  Transportation Screening Complete  Medication Review Press photographer) Complete  PCP or Specialist appointment within 3-5 days of discharge Complete  HRI or [place] Complete  SW Recovery Care/Counseling Consult Complete  [place] Not Applicable  Some recent data might be hidden

## 2019-07-16 LAB — COOXEMETRY PANEL
Carboxyhemoglobin: 1.9 % — ABNORMAL HIGH (ref 0.5–1.5)
Methemoglobin: 0.5 % (ref 0.0–1.5)
O2 Saturation: 64 %
Total hemoglobin: 9.3 g/dL — ABNORMAL LOW (ref 12.0–16.0)

## 2019-07-16 LAB — BASIC METABOLIC PANEL
Anion gap: 10 (ref 5–15)
BUN: 41 mg/dL — ABNORMAL HIGH (ref 8–23)
CO2: 31 mmol/L (ref 22–32)
Calcium: 9.1 mg/dL (ref 8.9–10.3)
Chloride: 94 mmol/L — ABNORMAL LOW (ref 98–111)
Creatinine, Ser: 2.1 mg/dL — ABNORMAL HIGH (ref 0.61–1.24)
GFR calc Af Amer: 37 mL/min — ABNORMAL LOW (ref >60)
GFR calc non Af Amer: 32 mL/min — ABNORMAL LOW (ref >60)
Glucose, Bld: 109 mg/dL — ABNORMAL HIGH (ref 70–99)
Potassium: 3.7 mmol/L (ref 3.5–5.1)
Sodium: 135 mmol/L (ref 135–145)

## 2019-07-16 MED ORDER — PANTOPRAZOLE SODIUM 40 MG PO TBEC: 40.0000 mg | DELAYED_RELEASE_TABLET | Freq: Every day | ORAL | Status: DC

## 2019-07-16 MED ORDER — ALPRAZOLAM 0.25 MG PO TABS: 0.2500 mg | ORAL_TABLET | Freq: Two times a day (BID) | ORAL | 0 refills | Status: DC | PRN

## 2019-07-16 MED ORDER — ACETAMINOPHEN 325 MG PO TABS: 650.0000 mg | ORAL_TABLET | Freq: Four times a day (QID) | ORAL | Status: DC | PRN

## 2019-07-16 MED ORDER — RANOLAZINE ER 500 MG PO TB12: 500.0000 mg | ORAL_TABLET | Freq: Two times a day (BID) | ORAL | Status: DC

## 2019-07-16 MED ORDER — SERTRALINE HCL 25 MG PO TABS: 25.0000 mg | ORAL_TABLET | Freq: Every day | ORAL | Status: DC

## 2019-07-16 MED ORDER — APIXABAN 5 MG PO TABS: 5.0000 mg | ORAL_TABLET | Freq: Two times a day (BID) | ORAL | Status: DC

## 2019-07-16 MED ORDER — LEVALBUTEROL HCL 0.63 MG/3ML IN NEBU: 0.6300 mg | INHALATION_SOLUTION | Freq: Four times a day (QID) | RESPIRATORY_TRACT | 12 refills | Status: DC | PRN

## 2019-07-16 MED ORDER — POTASSIUM CHLORIDE CRYS ER 20 MEQ PO TBCR
40.0000 meq | EXTENDED_RELEASE_TABLET | Freq: Once | ORAL | Status: AC
  Administered 2019-07-16: 40 meq via ORAL
  Filled 2019-07-16: qty 2

## 2019-07-16 MED ORDER — AMIODARONE HCL 200 MG PO TABS: 200.0000 mg | ORAL_TABLET | Freq: Three times a day (TID) | ORAL | Status: DC

## 2019-07-16 MED ORDER — ONDANSETRON HCL 4 MG PO TABS: 4.0000 mg | ORAL_TABLET | Freq: Four times a day (QID) | ORAL | 0 refills | Status: DC | PRN

## 2019-07-16 MED ORDER — POTASSIUM CHLORIDE CRYS ER 20 MEQ PO TBCR: 40.0000 meq | EXTENDED_RELEASE_TABLET | Freq: Every day | ORAL | Status: DC

## 2019-07-16 MED ORDER — DOCUSATE SODIUM 100 MG PO CAPS: 100.0000 mg | ORAL_CAPSULE | Freq: Two times a day (BID) | ORAL | 0 refills | Status: DC

## 2019-07-16 MED ORDER — TORSEMIDE 20 MG PO TABS: 40.0000 mg | ORAL_TABLET | Freq: Two times a day (BID) | ORAL | Status: DC

## 2019-07-16 MED ORDER — METOCLOPRAMIDE HCL 5 MG PO TABS: 5.0000 mg | ORAL_TABLET | Freq: Three times a day (TID) | ORAL | Status: DC

## 2019-07-16 NOTE — Progress Notes (Signed)
IR asked to offer recs for perc chole tube due to calculus cholecystitis. Pt well to known to [place]. Has had perc chole placed 12/13/18 and exchanged multiple times. Most recently 5/5. To our knowledge he has not had follow up with surgery but is not currently a surgical candidate due his cardiac issues. Unfortunately, because he has gallstones, he is at high risk for recurrent cholecystitis if the drain is removed. The drain needs to remain and he is already in our system for routine scheduled cholangiogram/drain exchanges.  Ascencion Dike PA-C Interventional Radiology 07/16/2019 8:37 AM

## 2019-07-16 NOTE — TOC Transition Note (Signed)
Transition of Care [facility]) - CM/SW Discharge Note   Patient Details  Name: Glenn [name] MRN: [id number] Date of Birth: Jul 02, 1950  Transition of Care [facility]) CM/SW Contact:  Trula Ore, [place] Phone Number: 07/16/2019, 11:28 AM   Clinical Narrative:     Patient will DC to: [place]  Anticipated DC date: 07/16/2019  Family notified: [name]  Transport by: Corey Harold  ?  Per MD patient ready for DC to . RN, patient, patient's family, and facility notified of DC. Discharge Summary sent to facility. RN given number for report tele#[phone] RM#[id number]. DC packet on chart. Ambulance transport requested for patient.  CSW signing off.  Final next level of care: Skilled Nursing Facility Barriers to Discharge: No Barriers Identified   Patient Goals and CMS Choice Patient states their goals for this hospitalization and ongoing recovery are:: SNF CMS Medicare.gov Compare Post Acute Care list provided to:: Patient Choice offered to / list presented to : Patient  Discharge Placement              Patient [facility] bed at: [facility]) Patient to be transferred to facility by: [place] Name of family member notified: [name] [phone] Patient and family notified of of transfer: 07/16/19  Discharge Plan and Services   Discharge Planning Services: CM Consult(Pt states sometimes refills aren't available @ [facility], outof stock) Post Acute Care Choice: [place]          DME Arranged: N/A DME Agency: NA       HH Arranged: RN [place] Agency: [facility] Date [place] Agency Contacted: 07/07/19 Time [place]: [place] Representative spoke with at [place]: [place] ([place]) Interventions     Readmission Risk Interventions Readmission Risk Prevention Plan 07/15/2019 02/05/2019  Transportation Screening Complete Complete  Medication Review Press photographer) - Complete  PCP or Specialist appointment within 3-5 days of  discharge - Complete  HRI or [place] - Complete  SW Recovery Care/Counseling Consult Complete Complete  Palliative Care Screening Complete Not Applicable  Skilled Nursing Facility Complete Not Applicable  Some recent data might be hidden

## 2019-07-16 NOTE — TOC Progression Note (Addendum)
Transition of Care [facility]) - Progression Note    Patient Details  Name: TREJUAN MATHERNE MRN: [id number] Date of Birth: [birth year]  Transition of Care [facility]) CM/SW [place], [place] Phone Number: 07/16/2019, 9:52 AM  Clinical Narrative:     CSW spoke with Dominica from [place]. Patients insurance has been approved. Insurance approval number is [id number]. Insurance approval is good for seven days.   Patient has bed at [facility] when medically ready for discharge. Insurance authorization has been approved.  Expected Discharge Plan: [place] Barriers to Discharge: Continued Medical Work up, SNF Pending bed offer  Expected Discharge Plan and Services Expected Discharge Plan: [place]   Discharge Planning Services: CM Consult(Pt states sometimes refills aren't available @ [facility], outof stock) Post Acute Care Choice: [place] arrangements for the past 2 months: Single Family Home                 DME Arranged: N/A DME Agency: NA       HH Arranged: RN [place] Agency: [facility] Date Prior [place]: 07/07/19 Time [place]: [place] Representative spoke with at [place]: [place] ([place]) Interventions    Readmission Risk Interventions Readmission Risk Prevention Plan 07/15/2019 02/05/2019  Transportation Screening Complete Complete  Medication Review Press photographer) - Complete  PCP or Specialist appointment within 3-5 days of discharge - Complete  HRI or [place] - Complete  SW Recovery Care/Counseling Consult Complete Complete  Palliative Care Screening Complete Not [place] Complete Not Applicable  Some recent data might be hidden

## 2019-07-16 NOTE — Discharge Instructions (Signed)

## 2019-07-16 NOTE — Progress Notes (Signed)
Removed midline and [place] on Rt. IJ without complications. Biliary drain is patterned. Patient discharged to SNF. Gave a reported to Therapist, sports at [facility]. PTAR took patient. HS [employer]

## 2019-07-16 NOTE — Progress Notes (Signed)
CARDIAC REHAB PHASE I   PRE:  Rate/Rhythm: NSR 72  BP:  Sitting: 126/52       SaO2: 98% RA  MODE:  Ambulation: 300 ft    POST:  Rate/Rhythm: NSR 79  BP:  Sitting: 140/69       SaO2: 100% RA 12:10-12:10  Pt received in bed, agrees to ambulate. Pt requires mod assistance to sit and stand. Pt walked with rollator, slow gait. Pt took rest break mid walk for 5 minutes. Pt back to room and placed on recliner; brake on call bell in reach. No complaints with walk except for leg fatigue (reason for rest break).   Lesly Rubenstein, MS, ACSM EP-C, [facility] 07/16/2019  3:09 PM

## 2019-07-16 NOTE — Evaluation (Signed)
Clinical/Bedside Swallow Evaluation Patient Details  Name: Glenn [name] MRN: [id number] Date of Birth: 27-Nov-1950  Today's Date: 07/16/2019 Time: SLP Start Time (ACUTE ONLY): 4166 SLP Stop Time (ACUTE ONLY): 0959 SLP Time Calculation (min) (ACUTE ONLY): 10 min  Past Medical History:  Past Medical History:  Diagnosis Date  . Acute blood loss anemia   . Acute CVA (cerebrovascular accident) ([place])   . Acute deep vein thrombosis (DVT) of both lower extremities ([name])   . Acute kidney injury ([place])   . Acute on chronic combined systolic and diastolic CHF (congestive heart failure) ([place])   . Acute renal failure superimposed on stage 3a chronic kidney disease ([place])   . Acute respiratory failure with hypoxia ([place])   . Atrial fibrillation ([place])   . Carpal tunnel syndrome of right wrist 02/28/2018  . Cerebral edema ([place]) 11/13/2018  . Cerebral infarction ([place])   . CHF (congestive heart failure) ([place])   . Cholecystitis 02/04/2019  . Chronic right hip pain   . DCM (dilated cardiomyopathy) ([place])   . Dysphagia, post-stroke   . Elevated troponin   . Entrapment of right ulnar nerve 02/28/2018  . Headache due to intracranial disease 11/14/2018  . Hepatitis C   . History of hemorrhagic stroke with residual hemiparesis ([place]) 02/04/2019  . HTN (hypertension) 08/14/2016  . Hyperlipidemia LDL goal <70 11/13/2018  . Hypertension   . ICD (implantable cardioverter-defibrillator) in place 09/13/2016  . Impotence due to erectile dysfunction 09/30/2017  . Ischemic cardiomyopathy   . Labile blood glucose   . Left leg DVT ([place]) 02/04/2019  . Marijuana user 11/13/2018  . Paroxysmal atrial fibrillation ([name])   . Right middle cerebral artery stroke ([place]) 11/13/2018  . Solitary pulmonary nodule 06/10/2017   5 mm RUL nodule noted incidentally as part of CVA workup 08/2016. With smoking history would obtain low-dose CT scan 08/2017.   . Stroke (cerebrum) ([name]) Lg L MCA infarct w/ hemorrhagic conversion, embolic d/t  AF 0/63/0160  . Stroke ([place])   . Trochanteric bursitis, right hip 11/14/2018  . Visit for monitoring Tikosyn therapy 03/26/2017   Past Surgical History:  Past Surgical History:  Procedure Laterality Date  . CARDIAC DEFIBRILLATOR PLACEMENT  2015  . CARDIOVERSION N/A 10/10/2016   Procedure: CARDIOVERSION;  Surgeon: Dorothy Spark, MD;  Location: [place];  Service: Cardiovascular;  Laterality: N/A;  . CARDIOVERSION N/A 03/27/2017   Procedure: CARDIOVERSION;  Surgeon: Jerline Pain, MD;  Location: [place];  Service: Cardiovascular;  Laterality: N/A;  . CARDIOVERSION N/A 10/29/2018   Procedure: CARDIOVERSION;  Surgeon: Sanda Klein, MD;  Location: [facility] ENDOSCOPY;  Service: Cardiovascular;  Laterality: N/A;  . CARDIOVERSION N/A 11/05/2018   Procedure: CARDIOVERSION;  Surgeon: Thayer Headings, MD;  Location: [facility] ENDOSCOPY;  Service: Cardiovascular;  Laterality: N/A;  . CARDIOVERSION N/A 07/02/2019   Procedure: CARDIOVERSION;  Surgeon: Jolaine Artist, MD;  Location: [place];  Service: Cardiovascular;  Laterality: N/A;  . CARDIOVERSION N/A 07/14/2019   Procedure: CARDIOVERSION;  Surgeon: Jolaine Artist, MD;  Location: [place];  Service: Cardiovascular;  Laterality: N/A;  . EYE SURGERY Left 1990  . IABP INSERTION N/A 06/26/2019   Procedure: IABP INSERTION;  Surgeon: Jolaine Artist, MD;  Location: [place] CV LAB;  Service: Cardiovascular;  Laterality: N/A;  . IR CATHETER TUBE CHANGE  06/24/2019  . IR EXCHANGE BILIARY DRAIN  02/11/2019  . IR EXCHANGE BILIARY DRAIN  06/09/2019  . IR IVC FILTER PLMT / S&I /IMG GUID/MOD SED  12/04/2018  .  IR PERC CHOLECYSTOSTOMY  12/13/2018  . IR PERCUTANEOUS ART THROMBECTOMY/INFUSION INTRACRANIAL INC DIAG ANGIO  09/05/2016  . IR RADIOLOGIST EVAL & MGMT  10/03/2016  . RADIOLOGY WITH ANESTHESIA N/A 09/05/2016   Procedure: RADIOLOGY WITH ANESTHESIA;  Surgeon: Luanne Bras, MD;  Location: [place];  Service: Radiology;  Laterality: N/A;  .  RIGHT HEART CATH N/A 06/24/2019   Procedure: RIGHT HEART CATH;  Surgeon: Jolaine Artist, MD;  Location: [place] CV LAB;  Service: Cardiovascular;  Laterality: N/A;  . RIGHT/LEFT HEART CATH AND CORONARY ANGIOGRAPHY N/A 11/03/2018   Procedure: RIGHT/LEFT HEART CATH AND CORONARY ANGIOGRAPHY;  Surgeon: Lorretta Harp, MD;  Location: [place] CV LAB;  Service: Cardiovascular;  Laterality: N/A;  . TEE WITHOUT CARDIOVERSION N/A 02/06/2019   Procedure: TRANSESOPHAGEAL ECHOCARDIOGRAM (TEE);  Surgeon: Skeet Latch, MD;  Location: [place];  Service: Cardiovascular;  Laterality: N/A;  . TEE WITHOUT CARDIOVERSION N/A 06/09/2019   Procedure: TRANSESOPHAGEAL ECHOCARDIOGRAM (TEE);  Surgeon: Buford Dresser, MD;  Location: [facility] ENDOSCOPY;  Service: Cardiovascular;  Laterality: N/A;   HPI:  Pt is a 69 y.o. male who presented on 06/24/19 with  SOB and perc cholecystotomy tube dysfunction. s/p  R heart cath, central line insertion 06/24/19,  IABP insertion 06/25/19.  06/30/19  TEE/DC-CV with conversion to NSR but went back in A fib. 07/02/19 Had repeat DCCV. 07/03/19 IABP removal. 5/17 back in afib,  5/18 back in NSR. PMHx: hemorrhagic stroke with left weakness, PAF, Ischemic cardiomyopathy, DVT, HTN, HLD. Pt was last seen by SLP 12/18/18 with recommendation of regular texture solids and thin liquids.    Assessment / Plan / Recommendation Clinical Impression  Pt was seen for bedside swallow evaluation and she denied a history of dysphagia. Pt reported that he had anterior loss of saliva yesterday since swallowing it was making him nauseated, but he stated that this has since resolved. Oral mechanism exam was [facility]. He tolerated all solids and liquids without signs or symptoms of oropharyngeal dysphagia. A regular texture diet with thin liquids is recommended at this time and further skilled SLP services are not clinically indicated for swallowing.  SLP Visit Diagnosis: Dysphagia, unspecified  (R13.10)    Aspiration Risk  No limitations    Diet Recommendation Regular;Thin liquid   Liquid Administration via: Cup;Straw Medication Administration: Whole meds with liquid Supervision: Patient able to self feed    Other  Recommendations Oral Care Recommendations: Oral care BID   Follow up Recommendations None      Frequency and Duration            Prognosis        Swallow Study   General Date of Onset: 07/15/19 HPI: Pt is a 69 y.o. male who presented on 06/24/19 with  SOB and perc cholecystotomy tube dysfunction. s/p  R heart cath, central line insertion 06/24/19,  IABP insertion 06/25/19.  06/30/19  TEE/DC-CV with conversion to NSR but went back in A fib. 07/02/19 Had repeat DCCV. 07/03/19 IABP removal. 5/17 back in afib,  5/18 back in NSR. PMHx: hemorrhagic stroke with left weakness, PAF, Ischemic cardiomyopathy, DVT, HTN, HLD. Pt was last seen by SLP 12/18/18 with recommendation of regular texture solids and thin liquids.  Type of Study: Bedside Swallow Evaluation Previous Swallow Assessment: See HPI Diet Prior to this Study: Regular;Thin liquids Temperature Spikes Noted: No Respiratory Status: Room air History of Recent Intubation: No Behavior/Cognition: Alert;Cooperative;Pleasant mood Oral Cavity Assessment: Within Functional Limits Oral Care Completed by SLP: No Oral Cavity - Dentition: Adequate  natural dentition Vision: Functional for self-feeding Self-Feeding Abilities: Able to feed self Patient Positioning: Upright in bed Baseline Vocal Quality: Normal Volitional Cough: Strong Volitional Swallow: Able to elicit    Oral/Motor/Sensory Function Overall Oral Motor/Sensory Function: Within functional limits   Ice Chips Ice chips: Within functional limits Presentation: Spoon   Thin Liquid Thin Liquid: Within functional limits Presentation: Straw    Nectar Thick Nectar Thick Liquid: Not tested   Honey Thick Honey Thick Liquid: Not tested   Puree Puree: Within  functional limits Presentation: Spoon   Solid    Markelle Asaro I. Hardin Negus, [place], [place] Office number [phone] Pager [phone]  Solid: Within functional limits Presentation: Spoon      Horton Marshall 07/16/2019,10:01 AM

## 2019-07-21 ENCOUNTER — Encounter: Payer: Medicare (Managed Care) | Admitting: Student

## 2019-07-28 ENCOUNTER — Telehealth (HOSPITAL_COMMUNITY): Payer: Self-pay | Admitting: Licensed Clinical Social Worker

## 2019-07-28 NOTE — Telephone Encounter (Signed)
CSW called pt to check in after DC from the hospital at the end of May.  Pt went to SNF for short term rehab and reports he is still there at this time.  Reports doing ok and plan will be to go to a facility for long term care and his cousin, Glenn [name], is helping to arrange this- would like CSW to reach out to his cousin to assist as needed.  CSW called and spoke with pt cousin who reports he is trying to assist in getting pt into long term care.  He is trying to find a facility in the [place] area which is where he and more of the pts family currently lives.  They have been talking to the [facility] but no word as to availability at this time.  CSW emailed pt cousin a list of ALF options in [place] as well as other SNF options in the area for him and his wife to look into if [facility] is unable to accept.  Informed they would need to review facility options and speak with their admissions about the transfer process.  Encouraged pt cousin to reach out if he is in need of further assistance.  CSW will continue to follow through clinic and assist as needed- will no longer being admitting into paramedicine program since plan is for long term care facility placement  Glenn [name], [facility] Desk#: [phone] Cell#: [phone]

## 2019-07-29 LAB — CUP PACEART INCLINIC DEVICE CHECK
Date Time Interrogation Session: 20210503131305
Implantable Lead Implant Date: 20151113
Implantable Lead Location: 753860
Implantable Lead Model: 295
Implantable Lead Serial Number: 135220
Implantable Pulse Generator Implant Date: 20151113
Lead Channel Pacing Threshold Amplitude: 0.7 V
Lead Channel Pacing Threshold Pulse Width: 0.5 ms
Lead Channel Sensing Intrinsic Amplitude: 14.5 mV
Pulse Gen Serial Number: 104417

## 2019-08-04 ENCOUNTER — Encounter: Payer: Medicare (Managed Care) | Admitting: Student

## 2019-08-10 ENCOUNTER — Encounter: Payer: Self-pay | Admitting: Student

## 2019-08-11 ENCOUNTER — Other Ambulatory Visit: Payer: Self-pay

## 2019-08-11 MED ORDER — DIGOXIN 125 MCG PO TABS: 0.1250 mg | ORAL_TABLET | Freq: Every day | ORAL | 3 refills | Status: DC

## 2019-08-13 ENCOUNTER — Emergency Department (HOSPITAL_COMMUNITY)
Admission: EM | Admit: 2019-08-13 | Discharge: 2019-08-14 | Disposition: A | Discharge status: Home / Self Care | Payer: Medicare HMO | Attending: Emergency Medicine | Admitting: Emergency Medicine

## 2019-08-13 ENCOUNTER — Other Ambulatory Visit: Payer: Self-pay

## 2019-08-13 DIAGNOSIS — N183 Chronic kidney disease, stage 3 unspecified: Secondary | ICD-10-CM | POA: Diagnosis not present

## 2019-08-13 DIAGNOSIS — I130 Hypertensive heart and chronic kidney disease with heart failure and stage 1 through stage 4 chronic kidney disease, or unspecified chronic kidney disease: Secondary | ICD-10-CM | POA: Insufficient documentation

## 2019-08-13 DIAGNOSIS — R10819 Abdominal tenderness, unspecified site: Secondary | ICD-10-CM | POA: Diagnosis present

## 2019-08-13 DIAGNOSIS — L03319 Cellulitis of trunk, unspecified: Secondary | ICD-10-CM | POA: Insufficient documentation

## 2019-08-13 DIAGNOSIS — Z87891 Personal history of nicotine dependence: Secondary | ICD-10-CM | POA: Diagnosis not present

## 2019-08-13 DIAGNOSIS — I5042 Chronic combined systolic (congestive) and diastolic (congestive) heart failure: Secondary | ICD-10-CM | POA: Diagnosis not present

## 2019-08-13 DIAGNOSIS — Z79899 Other long term (current) drug therapy: Secondary | ICD-10-CM | POA: Diagnosis not present

## 2019-08-13 DIAGNOSIS — Z7901 Long term (current) use of anticoagulants: Secondary | ICD-10-CM | POA: Insufficient documentation

## 2019-08-13 MED ORDER — OXYCODONE-ACETAMINOPHEN 5-325 MG PO TABS
2.0000 | ORAL_TABLET | Freq: Once | ORAL | Status: AC
  Administered 2019-08-14: 2 via ORAL
  Filled 2019-08-13: qty 2

## 2019-08-13 NOTE — ED Triage Notes (Signed)
Pt BIB [facility] from home w/ c/o pain at colostomy bag site, worse with inspiration, over past several days. Bag was placed in April. Redness, drainage noted at site. Hx Afib w/ defibrillator. Afebrile. VS stable, A&Ox4, NAD noted.

## 2019-08-14 DIAGNOSIS — L03319 Cellulitis of trunk, unspecified: Secondary | ICD-10-CM | POA: Diagnosis not present

## 2019-08-14 LAB — CBC WITH DIFFERENTIAL/PLATELET
Abs Immature Granulocytes: 0.01 10*3/uL (ref 0.00–0.07)
Basophils Absolute: 0 10*3/uL (ref 0.0–0.1)
Basophils Relative: 0 %
Eosinophils Absolute: 0.3 10*3/uL (ref 0.0–0.5)
Eosinophils Relative: 7 %
HCT: 31.3 % — ABNORMAL LOW (ref 39.0–52.0)
Hemoglobin: 10.2 g/dL — ABNORMAL LOW (ref 13.0–17.0)
Immature Granulocytes: 0 %
Lymphocytes Relative: 33 %
Lymphs Abs: 1.3 10*3/uL (ref 0.7–4.0)
MCH: 30.6 pg (ref 26.0–34.0)
MCHC: 32.6 g/dL (ref 30.0–36.0)
MCV: 94 fL (ref 80.0–100.0)
Monocytes Absolute: 0.6 10*3/uL (ref 0.1–1.0)
Monocytes Relative: 15 %
Neutro Abs: 1.8 10*3/uL (ref 1.7–7.7)
Neutrophils Relative %: 45 %
Platelets: 171 10*3/uL (ref 150–400)
RBC: 3.33 MIL/uL — ABNORMAL LOW (ref 4.22–5.81)
RDW: 15.1 % (ref 11.5–15.5)
WBC: 4.1 10*3/uL (ref 4.0–10.5)
nRBC: 0 % (ref 0.0–0.2)

## 2019-08-14 LAB — COMPREHENSIVE METABOLIC PANEL
ALT: 18 U/L (ref 0–44)
AST: 18 U/L (ref 15–41)
Albumin: 3.5 g/dL (ref 3.5–5.0)
Alkaline Phosphatase: 73 U/L (ref 38–126)
Anion gap: 11 (ref 5–15)
BUN: 21 mg/dL (ref 8–23)
CO2: 25 mmol/L (ref 22–32)
Calcium: 9 mg/dL (ref 8.9–10.3)
Chloride: 100 mmol/L (ref 98–111)
Creatinine, Ser: 1.85 mg/dL — ABNORMAL HIGH (ref 0.61–1.24)
GFR calc Af Amer: 43 mL/min — ABNORMAL LOW (ref >60)
GFR calc non Af Amer: 37 mL/min — ABNORMAL LOW (ref >60)
Glucose, Bld: 105 mg/dL — ABNORMAL HIGH (ref 70–99)
Potassium: 4.4 mmol/L (ref 3.5–5.1)
Sodium: 136 mmol/L (ref 135–145)
Total Bilirubin: 0.5 mg/dL (ref 0.3–1.2)
Total Protein: 6.4 g/dL — ABNORMAL LOW (ref 6.5–8.1)

## 2019-08-14 LAB — PROTIME-INR
INR: 1.2 (ref 0.8–1.2)
Prothrombin Time: 15 seconds (ref 11.4–15.2)

## 2019-08-14 MED ORDER — CEPHALEXIN 250 MG PO CAPS
1000.0000 mg | ORAL_CAPSULE | Freq: Once | ORAL | Status: AC
  Administered 2019-08-14: 1000 mg via ORAL
  Filled 2019-08-14: qty 4

## 2019-08-14 MED ORDER — CEPHALEXIN 500 MG PO CAPS
500.0000 mg | ORAL_CAPSULE | Freq: Four times a day (QID) | ORAL | 0 refills | Status: DC
Start: 2019-08-14 — End: 2019-12-25

## 2019-08-14 NOTE — ED Provider Notes (Signed)
[facility] EMERGENCY DEPARTMENT Provider Note   CSN: [id number] Arrival date & time: 08/13/19  2254     History Chief Complaint  Patient presents with  . Abdominal Pain    Glenn [name] is a 69 y.o. male.   Rash Location: around his chole drain. Quality: redness   Severity:  Mild Onset quality:  Gradual Duration:  3 days Timing:  Constant Progression:  Worsening Chronicity:  New Relieved by:  None tried Worsened by:  Nothing Ineffective treatments:  None tried Associated symptoms: no abdominal pain and no induration        Past Medical History:  Diagnosis Date  . Acute blood loss anemia   . Acute CVA (cerebrovascular accident) ([place])   . Acute deep vein thrombosis (DVT) of both lower extremities ([name])   . Acute kidney injury ([place])   . Acute on chronic combined systolic and diastolic CHF (congestive heart failure) ([place])   . Acute renal failure superimposed on stage 3a chronic kidney disease ([place])   . Acute respiratory failure with hypoxia ([place])   . Atrial fibrillation ([place])   . Carpal tunnel syndrome of right wrist 02/28/2018  . Cerebral edema ([place]) 11/13/2018  . Cerebral infarction ([place])   . CHF (congestive heart failure) ([place])   . Cholecystitis 02/04/2019  . Chronic right hip pain   . DCM (dilated cardiomyopathy) ([place])   . Dysphagia, post-stroke   . Elevated troponin   . Entrapment of right ulnar nerve 02/28/2018  . Headache due to intracranial disease 11/14/2018  . Hepatitis C   . History of hemorrhagic stroke with residual hemiparesis ([place]) 02/04/2019  . HTN (hypertension) 08/14/2016  . Hyperlipidemia LDL goal <70 11/13/2018  . Hypertension   . ICD (implantable cardioverter-defibrillator) in place 09/13/2016  . Impotence due to erectile dysfunction 09/30/2017  . Ischemic cardiomyopathy   . Labile blood glucose   . Left leg DVT ([place]) 02/04/2019  . Marijuana user 11/13/2018  . Paroxysmal atrial fibrillation ([name])   . Right middle  cerebral artery stroke ([place]) 11/13/2018  . Solitary pulmonary nodule 06/10/2017   5 mm RUL nodule noted incidentally as part of CVA workup 08/2016. With smoking history would obtain low-dose CT scan 08/2017.   . Stroke (cerebrum) ([name]) Lg L MCA infarct w/ hemorrhagic conversion, embolic d/t AF 11/12/2681  . Stroke ([place])   . Trochanteric bursitis, right hip 11/14/2018  . Visit for monitoring Tikosyn therapy 03/26/2017    Patient Active Problem List   Diagnosis Date Noted  . Malnutrition of moderate degree 07/14/2019  . Palliative care by specialist   . Atrial fibrillation with RVR ([place]) 06/23/2019  . Chronic systolic heart failure ([place]) 06/21/2019  . Persistent atrial fibrillation with RVR ([place])   . Thrombus of left atrial appendage   . Left leg pain 03/27/2019  . History of hemorrhagic stroke with residual hemiparesis ([place]) 02/04/2019  . Left leg DVT ([place]) 02/04/2019  . Cholecystitis 02/04/2019  . Acute deep vein thrombosis (DVT) of both lower extremities ([name])   . Acute blood loss anemia   . Labile blood glucose   . AKI (acute kidney injury) ([place])   . Chronic right hip pain   . Acute renal failure ([place])   . Headache due to intracranial disease 11/14/2018  . Trochanteric bursitis, right hip 11/14/2018  . Cerebral edema ([place]) 11/13/2018  . Hyperlipidemia LDL goal <70 11/13/2018  . Marijuana user 11/13/2018  . Right middle cerebral artery stroke ([place]) 11/13/2018  . Acute CVA (cerebrovascular  accident) ([place])   . Noncompliance   . Dysphagia, post-stroke   . Acute on chronic combined systolic and diastolic CHF (congestive heart failure) ([place])   . DCM (dilated cardiomyopathy) ([place])   . Atrial fibrillation ([place]) 11/04/2018  . Elevated troponin   . Acute respiratory failure with hypoxia ([place])   . Acute kidney injury ([place])   . Entrapment of right ulnar nerve 02/28/2018  . Carpal tunnel syndrome of right wrist 02/28/2018  . Impotence due to erectile dysfunction 09/30/2017  . Solitary  pulmonary nodule 06/10/2017  . Neck pain 04/06/2017  . Paroxysmal atrial fibrillation ([name])   . ICD (implantable cardioverter-defibrillator) in place 09/13/2016  . Chest pain 09/13/2016  . Tobacco abuse 09/13/2016  . Hospital discharge follow-up 09/13/2016  . Upper back pain 09/13/2016  . Housing problems 09/13/2016  . Ischemic cardiomyopathy   . Cerebral infarction ([place])   . Stroke (cerebrum) ([name]) Lg L MCA infarct w/ hemorrhagic conversion, embolic d/t AF 28/31/5176  . HTN (hypertension) 08/14/2016  . Chronic Hepatitis C  08/14/2016    Past Surgical History:  Procedure Laterality Date  . CARDIAC DEFIBRILLATOR PLACEMENT  2015  . CARDIOVERSION N/A 10/10/2016   Procedure: CARDIOVERSION;  Surgeon: Dorothy Spark, MD;  Location: [facility];  Service: Cardiovascular;  Laterality: N/A;  . CARDIOVERSION N/A 03/27/2017   Procedure: CARDIOVERSION;  Surgeon: Jerline Pain, MD;  Location: [facility];  Service: Cardiovascular;  Laterality: N/A;  . CARDIOVERSION N/A 10/29/2018   Procedure: CARDIOVERSION;  Surgeon: Sanda Klein, MD;  Location: [facility] ENDOSCOPY;  Service: Cardiovascular;  Laterality: N/A;  . CARDIOVERSION N/A 11/05/2018   Procedure: CARDIOVERSION;  Surgeon: Thayer Headings, MD;  Location[facility] ENDOSCOPY;  Service: Cardiovascular;  Laterality: N/A;  . CARDIOVERSION N/A 07/02/2019   Procedure: CARDIOVERSION;  Surgeon: Jolaine Artist, MD;  Location: [facility];  Service: Cardiovascular;  Laterality: N/A;  . CARDIOVERSION N/A 07/14/2019   Procedure: CARDIOVERSION;  Surgeon: Jolaine Artist, MD;  Location: [facility];  Service: Cardiovascular;  Laterality: N/A;  . EYE SURGERY Left 1990  . IABP INSERTION N/A 06/26/2019   Procedure: IABP INSERTION;  Surgeon: Jolaine Artist, MD;  Location: [facility] CV LAB;  Service: Cardiovascular;  Laterality: N/A;  . IR CATHETER TUBE CHANGE  06/24/2019  . IR EXCHANGE BILIARY DRAIN  02/11/2019  . IR EXCHANGE BILIARY DRAIN  06/09/2019  . IR  IVC FILTER PLMT / S&I /IMG GUID/MOD SED  12/04/2018  . IR PERC CHOLECYSTOSTOMY  12/13/2018  . IR PERCUTANEOUS ART THROMBECTOMY/INFUSION INTRACRANIAL INC DIAG ANGIO  09/05/2016  . IR RADIOLOGIST EVAL & MGMT  10/03/2016  . RADIOLOGY WITH ANESTHESIA N/A 09/05/2016   Procedure: RADIOLOGY WITH ANESTHESIA;  Surgeon: Luanne Bras, MD;  Location: [facility];  Service: Radiology;  Laterality: N/A;  . RIGHT HEART CATH N/A 06/24/2019   Procedure: RIGHT HEART CATH;  Surgeon: Jolaine Artist, MD;  Location: [facility] CV LAB;  Service: Cardiovascular;  Laterality: N/A;  . RIGHT/LEFT HEART CATH AND CORONARY ANGIOGRAPHY N/A 11/03/2018   Procedure: RIGHT/LEFT HEART CATH AND CORONARY ANGIOGRAPHY;  Surgeon: Lorretta Harp, MD;  Location: [facility] CV LAB;  Service: Cardiovascular;  Laterality: N/A;  . TEE WITHOUT CARDIOVERSION N/A 02/06/2019   Procedure: TRANSESOPHAGEAL ECHOCARDIOGRAM (TEE);  Surgeon: Skeet Latch, MD;  Location: [facility];  Service: Cardiovascular;  Laterality: N/A;  . TEE WITHOUT CARDIOVERSION N/A 06/09/2019   Procedure: TRANSESOPHAGEAL ECHOCARDIOGRAM (TEE);  Surgeon: Buford Dresser, MD;  Location: [facility];  Service: Cardiovascular;  Laterality: N/A;  Family History  Problem Relation Age of Onset  . High blood pressure Mother   . High blood pressure Father   . Stroke Maternal Aunt   . Heart disease Neg Hx     Social History   Tobacco Use  . Smoking status: Former Smoker    Packs/day: 0.50    Types: Cigarettes  . Smokeless tobacco: Never Used  . Tobacco comment: a pack last three days  Vaping Use  . Vaping Use: Never used  Substance Use Topics  . Alcohol use: Not Currently    Alcohol/week: 3.0 standard drinks    Types: 3 Cans of beer per week    Comment: pt stop drinking   . Drug use: Not Currently    Frequency: 2.0 times per week    Types: Marijuana    Comment: stop smoking     Home Medications Prior to Admission medications   Medication  Sig Start Date End Date Taking? Authorizing Provider  acetaminophen (TYLENOL) 325 MG tablet Take 2 tablets (650 mg total) by mouth every 6 (six) hours as needed for mild pain (or Fever >/= 101). 07/16/19  Yes Clegg, Amy D, NP  ALPRAZolam (XANAX) 0.25 MG tablet Take 1 tablet (0.25 mg total) by mouth 2 (two) times daily as needed for anxiety. 07/16/19   Clegg, Amy D, NP  amiodarone (PACERONE) 200 MG tablet Take 1 tablet (200 mg total) by mouth every 8 (eight) hours. 07/16/19   Clegg, Amy D, NP  apixaban (ELIQUIS) 5 MG TABS tablet Take 1 tablet (5 mg total) by mouth 2 (two) times daily. 07/16/19   Clegg, Amy D, NP  atorvastatin (LIPITOR) 20 MG tablet TAKE 1 TABLET(20 MG) BY MOUTH DAILY AT 6 PM Patient taking differently: Take 20 mg by mouth every evening.  03/13/19   [name], MD  calcium carbonate (TUMS - DOSED IN MG ELEMENTAL CALCIUM) 500 MG chewable tablet Chew 1 tablet (200 mg of elemental calcium total) by mouth daily as needed for indigestion or heartburn. 03/12/19   [name], MD  cephALEXin (KEFLEX) 500 MG capsule Take 1 capsule (500 mg total) by mouth 4 (four) times daily. 08/14/19   Latoiya Maradiaga, Corene Cornea, MD  digoxin (LANOXIN) 0.125 MG tablet Take 1 tablet (0.125 mg total) by mouth daily. 08/11/19   Baldwin Jamaica, PA-C  docusate sodium (COLACE) 100 MG capsule Take 1 capsule (100 mg total) by mouth 2 (two) times daily. 07/16/19   Clegg, Amy D, NP  levalbuterol Penne Lash) 0.63 MG/3ML nebulizer solution Take 3 mLs (0.63 mg total) by nebulization every 6 (six) hours as needed for wheezing or shortness of breath. 07/16/19   Clegg, Amy D, NP  metoCLOPramide (REGLAN) 5 MG tablet Take 1 tablet (5 mg total) by mouth 3 (three) times daily. 07/16/19   Clegg, Amy D, NP  ondansetron (ZOFRAN) 4 MG tablet Take 1 tablet (4 mg total) by mouth every 6 (six) hours as needed for nausea. 07/16/19   Clegg, Amy D, NP  pantoprazole (PROTONIX) 40 MG tablet Take 1 tablet (40 mg total) by mouth daily. 07/16/19   Clegg, Amy D,  NP  potassium chloride SA (KLOR-CON) 20 MEQ tablet Take 2 tablets (40 mEq total) by mouth daily. 07/17/19   Clegg, Amy D, NP  ranolazine (RANEXA) 500 MG 12 hr tablet Take 1 tablet (500 mg total) by mouth 2 (two) times daily. 07/16/19   Clegg, Amy D, NP  sertraline (ZOLOFT) 25 MG tablet Take 1 tablet (25 mg total) by mouth daily.  07/16/19   Clegg, Amy D, NP  torsemide (DEMADEX) 20 MG tablet Take 2 tablets (40 mg total) by mouth 2 (two) times daily. 07/16/19   Clegg, Amy D, NP  traMADol (ULTRAM) 50 MG tablet Take 1 tablet (50 mg total) by mouth every 8 (eight) hours as needed for severe pain. 03/12/19   [name], MD    Allergies    Benadryl [diphenhydramine]  Review of Systems   Review of Systems  Gastrointestinal: Negative for abdominal pain.  Skin: Positive for rash.  All other systems reviewed and are negative.   Physical Exam Updated Vital Signs BP 130/66 (BP Location: Right Arm)   Pulse 66   Temp 98.2 F (36.8 C) (Oral)   Resp 19   Ht 5\' 7"  (1.702 m)   Wt 68.9 kg   SpO2 98%   BMI 23.81 kg/m   Physical Exam Vitals and nursing note reviewed.  Constitutional:      Appearance: He is well-developed.  HENT:     Head: Normocephalic and atraumatic.  Cardiovascular:     Rate and Rhythm: Normal rate.  Pulmonary:     Effort: Pulmonary effort is normal. No respiratory distress.  Abdominal:     General: There is no distension.     Tenderness: There is abdominal tenderness (to the skin around his drain site with erythema and warmth).  Musculoskeletal:        General: Normal range of motion.     Cervical back: Normal range of motion.  Neurological:     Mental Status: He is alert.     ED Results / Procedures / Treatments   Labs (all labs ordered are listed, but only abnormal results are displayed) Labs Reviewed  CBC WITH DIFFERENTIAL/PLATELET - Abnormal; Notable for the following components:      Result Value   RBC 3.33 (*)    Hemoglobin 10.2 (*)    HCT 31.3 (*)     All other components within normal limits  COMPREHENSIVE METABOLIC PANEL - Abnormal; Notable for the following components:   Glucose, Bld 105 (*)    Creatinine, Ser 1.85 (*)    Total Protein 6.4 (*)    GFR calc non Af Amer 37 (*)    GFR calc Af Amer 43 (*)    All other components within normal limits  PROTIME-INR    EKG EKG Interpretation  Date/Time:  Thursday August 13 2019 23:00:16 EDT Ventricular Rate:  74 PR Interval:    QRS Duration: 102 QT Interval:  417 QTC Calculation: 463 R Axis:   -22 Text Interpretation: Sinus or ectopic atrial rhythm Borderline left axis deviation No significant change since last tracing Confirmed by Merrily Pew [phone]) on 08/14/2019 12:30:16 AM   Radiology No results found.  Procedures Procedures (including critical care time)  Medications Ordered in ED Medications  oxyCODONE-acetaminophen (PERCOCET/ROXICET) 5-325 MG per tablet 2 tablet (2 tablets Oral Given 08/14/19 0117)  cephALEXin (KEFLEX) capsule 1,000 mg (1,000 mg Oral Given 08/14/19 0202)    ED Course  I have reviewed the triage vital signs and the nursing notes.  Pertinent labs & imaging results that were available during my care of the patient were reviewed by me and considered in my medical decision making (see chart for details).    MDM Rules/Calculators/A&P                         Some dermatitis around his drain site. Mild purulent drainage in same area.  Will check basic labs, likely start abx. Already has outpatient nursing wound care, which will continue.   Will initiate abx. Nursing at home already.   Final Clinical Impression(s) / ED Diagnoses Final diagnoses:  Cellulitis of trunk, unspecified site of trunk    Rx / DC Orders ED Discharge Orders         Ordered    cephALEXin (KEFLEX) 500 MG capsule  4 times daily     Discontinue  Reprint     08/14/19 0150           Elissia Spiewak, Corene Cornea, MD 08/14/19 [phone]

## 2019-08-14 NOTE — ED Notes (Addendum)
Report called to [facility].

## 2019-08-14 NOTE — ED Notes (Signed)
[facility] called to transport pt 

## 2019-08-19 ENCOUNTER — Ambulatory Visit (INDEPENDENT_AMBULATORY_CARE_PROVIDER_SITE_OTHER): Payer: Medicare HMO | Admitting: *Deleted

## 2019-08-19 DIAGNOSIS — I5022 Chronic systolic (congestive) heart failure: Secondary | ICD-10-CM | POA: Diagnosis not present

## 2019-08-19 LAB — CUP PACEART REMOTE DEVICE CHECK
Battery Remaining Longevity: 18 mo
Battery Remaining Percentage: 34 %
Brady Statistic RV Percent Paced: 1 %
Date Time Interrogation Session: 20210629183100
HighPow Impedance: 43 Ohm
Implantable Lead Implant Date: 20151113
Implantable Lead Location: 753860
Implantable Lead Model: 295
Implantable Lead Serial Number: 135220
Implantable Pulse Generator Implant Date: 20151113
Lead Channel Impedance Value: 725 Ohm
Lead Channel Pacing Threshold Amplitude: 0.7 V
Lead Channel Pacing Threshold Pulse Width: 0.5 ms
Lead Channel Setting Pacing Amplitude: 2.5 V
Lead Channel Setting Pacing Pulse Width: 0.5 ms
Lead Channel Setting Sensing Sensitivity: 0.5 mV
Pulse Gen Serial Number: 104417

## 2019-08-20 NOTE — Progress Notes (Signed)
Remote ICD transmission.   

## 2019-08-24 NOTE — Progress Notes (Addendum)
Subjective:   Patient ID: Glenn [name]    DOB: December 07, 1950, 69 y.o. male   MRN: [id number]  Deontrey Massi Desir is a 69 y.o. male with a history of chronic systolic HF 2/2 NICM s/p ICD, atrial fibrillation, h/o stroke (Lg L MCA infarct w/ hemorrhagic conversionin 2018), h/o DVT, chornic a/c therapy, HTN, HLD, pulmonary nodule, Hepatitis C, carpal tunnel, tobacco abuse (smokes ~1/4 ppd) and chronic cholecystitis. He has chronic perc drain. here for hospital follow up.  Acute on Chronic Heart Failure: Patient was admitted on 06/23/2019 for acute on chronic heart failure.  He developed cardiogenic shock requiring intra-aortic balloon pump (IABP) support and milrinone.  IABP was gradually weaned followed by milrinone.  He was diuresed with IV Lasix and later transitioned to torsemide 40 mg twice daily.  He is supposed to follow up with heart failure clinic but has not yet. Endorses good urine output.  Denies any chest pain, SOB, significant LE swelling, or weight gain. New dry weight as patient has lost significant weight. Appears to be stable: 153lbs today, 152lbs at discharge.   Echo: 05/2019: EF <30, RV mildly reudced. GIIIDD. Moderate TR, No LVH.  R/L [facility] 10/2018: normal coronaries, elevated filling pressures, lowCl at 1.9  AKI: During admission he also developed AKI in setting of cardiogenic shock.  Creatinine max was 3.0, creatinine on discharge was 2.05.   Persistent A-Fib with RVR  NSVT During admission patient also developed A. fib with RVR.  He was placed on amiodarone and underwent multiple cardioversions however unable to stay in NSR. Current meds include: Ranolazine 500mg  BID, Amiodarone 200mg  q8h, Digoxin 0.125mg  QD, and Eliquis 5mg  BID. Denies any chest pain, SOB, or bleeding. Mag goal >2, K goal <4.  Review of Systems:  Per HPI.   Objective:   BP 140/72   Pulse 78   Wt 153 lb 6.4 oz (69.6 kg)   SpO2 95%   BMI 24.03 kg/m  Vitals and nursing note reviewed.  General:  pleasant thin older male, sitting comfortably in exam chair, well nourished, well developed, in no acute distress with non-toxic appearance Neck: supple, normal ROM CV: regular rate and rhythm without murmurs, rubs, or gallops, no lower extremity edema, 2+ pedal pulses bilaterally Lungs: clear to auscultation bilaterally with normal work of breathing Abdomen: soft, non-tender, non-distended, chronic perc chole tube - surrounding skin without erythema, warmth, or rash, brownish liquid in collecting bag  Skin: warm, dry Extremities: warm and well perfused MSK:  gait normal Neuro: Alert and oriented, speech normal  Assessment & Plan:   Chronic systolic heart failure ([place]) Patient appears to be stable since discharge. Currently at dry weight and tolerating Torsemide BID with adequate urine output. Appears euvolemic on exam today.  - continue current meds - continue to hold Entresto and Spironolactone in setting of h/o AKI - Patient instructed to follow up with Heart Failure clinic - Patient instructed to RTC if >3lbs weight gain, worsening SOB or LE swelling  Atrial fibrillation with RVR ([place]) Asymptomatic and currently with regular rate and rhythm appreciated on exam. No acute bleeding. Continue Ranolazine 500mg  BID, Amiodarone 200mg  q8h, Digoxin 0.125mg  QD, and Eliquis 5mg  BID as prescribed.  - follow up CBC to monitor Hgb - follow digoxin level  - follow up CMP to monitor electrolytes  - patient instructed to follow up with cardiology  AKI (acute kidney injury) ([place]) Repeat CMP today to monitor kidney function and electrolytes  Chronic cholecystitis Chronic. Currently with Percutaneous cholecystostomy tube  with collecting bag. Per chart review, he is not a surgical candidate due to his cardiac issues. He has history of gallstones thus is at high risk for recurrent cholecystitis if drain is removed. Appears patient is in system for routine scheduled cholangiogram/drain exchanges. Patient  recently treated for overlying skin infection with Keflex. Drain site and overlying skin very well appearing without signs of recurrent infection on exam today.  - follow up with PCP   Prior Rehabilitation: Patient lives with his son who helps care for him. He helps manage his medications and ensures adherence. Patient has a Marine scientist aid through insurance [facility]?) that visits 2-3x per month that assists in monitoring vitals and percutaneous cholecystostomy care. Gait WNL on exam today. Able to complete ADL's and IADL's. Do not feel patient requires further assistance at this time. Can consider referral to home PT/OT or CCM in future if level of care is indicated.  Orders Placed This Encounter  Procedures  . CBC    Standing Status:   Future    Standing Expiration Date:   08/24/2020  . Comprehensive metabolic panel    Standing Status:   Future    Standing Expiration Date:   08/24/2020  . Digoxin level    Standing Status:   Future    Standing Expiration Date:   08/24/2020  . AMB referral to CHF clinic    Referral Priority:   Routine    Referral Type:   Consultation    Number of Visits Requested:   [place], DO PGY-3, [facility] 08/25/2019 8:58 PM

## 2019-08-25 ENCOUNTER — Ambulatory Visit (INDEPENDENT_AMBULATORY_CARE_PROVIDER_SITE_OTHER): Payer: Medicare HMO | Admitting: Family Medicine

## 2019-08-25 ENCOUNTER — Encounter: Payer: Self-pay | Admitting: Family Medicine

## 2019-08-25 ENCOUNTER — Other Ambulatory Visit: Payer: Self-pay

## 2019-08-25 VITALS — BP 140/72 | HR 78 | Wt 153.4 lb

## 2019-08-25 DIAGNOSIS — I4891 Unspecified atrial fibrillation: Secondary | ICD-10-CM | POA: Diagnosis not present

## 2019-08-25 DIAGNOSIS — Z7901 Long term (current) use of anticoagulants: Secondary | ICD-10-CM | POA: Diagnosis not present

## 2019-08-25 DIAGNOSIS — I5022 Chronic systolic (congestive) heart failure: Secondary | ICD-10-CM

## 2019-08-25 DIAGNOSIS — K811 Chronic cholecystitis: Secondary | ICD-10-CM

## 2019-08-25 DIAGNOSIS — N179 Acute kidney failure, unspecified: Secondary | ICD-10-CM

## 2019-08-25 NOTE — Patient Instructions (Signed)
Thank you for coming to see me today. It was a pleasure to see you.   Be sure to follow up with cardiology as soon as possible.   Dr. Shaune Pascal. Bensimhon, MD Address: [address], [place], [place] [address] Phone: [phone]  We are checking some labs today, I will call you if they are abnormal will send you a MyChart message or a letter if they are normal.  If you do not hear about your labs in the next 2 weeks please let us know.  Please follow-up with PCP in 3 months.  If you have any questions or concerns, please do not hesitate to call the office at [phone].  Take Care,  Dr. Mina Marble, DO Resident Physician [place] [phone]

## 2019-08-25 NOTE — Assessment & Plan Note (Signed)
Repeat CMP today to monitor kidney function and electrolytes

## 2019-08-25 NOTE — Assessment & Plan Note (Signed)
Asymptomatic and currently with regular rate and rhythm appreciated on exam. No acute bleeding. Continue Ranolazine 500mg  BID, Amiodarone 200mg  q8h, Digoxin 0.125mg  QD, and Eliquis 5mg  BID as prescribed.  - follow up CBC to monitor Hgb - follow digoxin level  - follow up CMP to monitor electrolytes  - patient instructed to follow up with cardiology

## 2019-08-25 NOTE — Assessment & Plan Note (Signed)
Chronic. Currently with Percutaneous cholecystostomy tube with collecting bag. Per chart review, he is not a surgical candidate due to his cardiac issues. He has history of gallstones thus is at high risk for recurrent cholecystitis if drain is removed. Appears patient is in system for routine scheduled cholangiogram/drain exchanges. Patient recently treated for overlying skin infection with Keflex. Drain site and overlying skin very well appearing without signs of recurrent infection on exam today.  - follow up with PCP

## 2019-08-25 NOTE — Assessment & Plan Note (Addendum)
Patient appears to be stable since discharge. Currently at dry weight and tolerating Torsemide BID with adequate urine output. Appears euvolemic on exam today.  - continue current meds - continue to hold Entresto and Spironolactone in setting of h/o AKI - Patient instructed to follow up with Heart Failure clinic - Patient instructed to RTC if >3lbs weight gain, worsening SOB or LE swelling

## 2019-08-26 ENCOUNTER — Other Ambulatory Visit: Payer: Medicare HMO

## 2019-08-26 ENCOUNTER — Telehealth: Payer: Self-pay

## 2019-08-26 ENCOUNTER — Other Ambulatory Visit: Payer: Self-pay

## 2019-08-26 DIAGNOSIS — N179 Acute kidney failure, unspecified: Secondary | ICD-10-CM

## 2019-08-26 DIAGNOSIS — I4891 Unspecified atrial fibrillation: Secondary | ICD-10-CM

## 2019-08-26 DIAGNOSIS — Z7901 Long term (current) use of anticoagulants: Secondary | ICD-10-CM

## 2019-08-26 NOTE — Telephone Encounter (Signed)
Patient will come in today for labs to be drawn.  Glenn [name], Glenn [name]

## 2019-08-26 NOTE — Telephone Encounter (Signed)
-----   Message from Danna Hefty, [place] sent at 08/25/2019  8:40 PM EDT ----- Please schedule patient for lab visit to have labs obtained. He left prior to being obtained. Thank you.

## 2019-08-27 ENCOUNTER — Telehealth: Payer: Self-pay | Admitting: Family Medicine

## 2019-08-27 LAB — COMPREHENSIVE METABOLIC PANEL
ALT: 14 IU/L (ref 0–44)
AST: 19 IU/L (ref 0–40)
Albumin/Globulin Ratio: 1.6 (ref 1.2–2.2)
Albumin: 4.4 g/dL (ref 3.8–4.8)
Alkaline Phosphatase: 84 IU/L (ref 48–121)
BUN/Creatinine Ratio: 9 — ABNORMAL LOW (ref 10–24)
BUN: 20 mg/dL (ref 8–27)
Bilirubin Total: 0.3 mg/dL (ref 0.0–1.2)
CO2: 26 mmol/L (ref 20–29)
Calcium: 9.8 mg/dL (ref 8.6–10.2)
Chloride: 100 mmol/L (ref 96–106)
Creatinine, Ser: 2.27 mg/dL — ABNORMAL HIGH (ref 0.76–1.27)
GFR calc Af Amer: 33 mL/min/{1.73_m2} — ABNORMAL LOW (ref >59)
GFR calc non Af Amer: 29 mL/min/{1.73_m2} — ABNORMAL LOW (ref >59)
Globulin, Total: 2.7 g/dL (ref 1.5–4.5)
Glucose: 91 mg/dL (ref 65–99)
Potassium: 4.7 mmol/L (ref 3.5–5.2)
Sodium: 141 mmol/L (ref 134–144)
Total Protein: 7.1 g/dL (ref 6.0–8.5)

## 2019-08-27 LAB — CBC
Hematocrit: 34.7 % — ABNORMAL LOW (ref 37.5–51.0)
Hemoglobin: 11.9 g/dL — ABNORMAL LOW (ref 13.0–17.7)
MCH: 31.9 pg (ref 26.6–33.0)
MCHC: 34.3 g/dL (ref 31.5–35.7)
MCV: 93 fL (ref 79–97)
Platelets: 180 10*3/uL (ref 150–450)
RBC: 3.73 x10E6/uL — ABNORMAL LOW (ref 4.14–5.80)
RDW: 14.6 % (ref 11.6–15.4)
WBC: 4.1 10*3/uL (ref 3.4–10.8)

## 2019-08-27 LAB — DIGOXIN LEVEL: Digoxin, Serum: 0.9 ng/mL (ref 0.5–0.9)

## 2019-08-28 ENCOUNTER — Telehealth: Payer: Self-pay

## 2019-08-28 NOTE — Telephone Encounter (Signed)
Spoke to patient in regards to persistently elevated kidney function that was improving but is now worsening. Will have patient come in for further evaluation and medication adjustment. Scheduled for 09/08/19 on ATC with me.

## 2019-08-28 NOTE — Telephone Encounter (Signed)
I do not see this medication in this patient med history or current medications. Please advise. Thank you.  Gerlene Fee, DO 08/28/2019, 2:14 PM PGY-2, [place]

## 2019-08-28 NOTE — Telephone Encounter (Signed)
This medication was ordered when he was in the hospital for 1 dose. If the patient needs something for anxiety he will need to make an appointment.   [place], DO 08/28/2019, 2:53 PM PGY-2, [place]

## 2019-08-31 NOTE — Telephone Encounter (Signed)
Appointment made for 09/01/2019.  Glenn [name], [place]

## 2019-09-01 ENCOUNTER — Other Ambulatory Visit: Payer: Self-pay

## 2019-09-01 ENCOUNTER — Ambulatory Visit (INDEPENDENT_AMBULATORY_CARE_PROVIDER_SITE_OTHER): Payer: Medicare HMO | Admitting: Family Medicine

## 2019-09-01 VITALS — BP 142/72 | HR 69 | Ht 67.0 in | Wt 158.0 lb

## 2019-09-01 DIAGNOSIS — F419 Anxiety disorder, unspecified: Secondary | ICD-10-CM | POA: Diagnosis not present

## 2019-09-01 DIAGNOSIS — K811 Chronic cholecystitis: Secondary | ICD-10-CM

## 2019-09-01 DIAGNOSIS — N179 Acute kidney failure, unspecified: Secondary | ICD-10-CM

## 2019-09-01 MED ORDER — SERTRALINE HCL 25 MG PO TABS: 25.0000 mg | ORAL_TABLET | Freq: Every day | ORAL | 0 refills | Status: DC

## 2019-09-01 NOTE — Progress Notes (Addendum)
See separate note.

## 2019-09-01 NOTE — Patient Instructions (Signed)
It was nice to meet you today!  Start sertraline 25mg  daily Come back next week to see Dr. Tarry Kos as scheduled. If you develop any thoughts of hurting yourself or others, please call our clinic immediately  Checking kidney function today  Call us with any questions  Be well, Dr. Ardelia Mems

## 2019-09-02 ENCOUNTER — Encounter: Payer: Self-pay | Admitting: Family Medicine

## 2019-09-02 DIAGNOSIS — F419 Anxiety disorder, unspecified: Secondary | ICD-10-CM | POA: Insufficient documentation

## 2019-09-02 LAB — RENAL FUNCTION PANEL
Albumin: 4.3 g/dL (ref 3.8–4.8)
BUN/Creatinine Ratio: 10 (ref 10–24)
BUN: 18 mg/dL (ref 8–27)
CO2: 28 mmol/L (ref 20–29)
Calcium: 9.4 mg/dL (ref 8.6–10.2)
Chloride: 100 mmol/L (ref 96–106)
Creatinine, Ser: 1.89 mg/dL — ABNORMAL HIGH (ref 0.76–1.27)
GFR calc Af Amer: 42 mL/min/{1.73_m2} — ABNORMAL LOW (ref >59)
GFR calc non Af Amer: 36 mL/min/{1.73_m2} — ABNORMAL LOW (ref >59)
Glucose: 93 mg/dL (ref 65–99)
Phosphorus: 3.2 mg/dL (ref 2.8–4.1)
Potassium: 4.5 mmol/L (ref 3.5–5.2)
Sodium: 140 mmol/L (ref 134–144)

## 2019-09-02 NOTE — Assessment & Plan Note (Signed)
Update renal function panel today.
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
## 2019-09-02 NOTE — Assessment & Plan Note (Signed)
Recently tolerated SSRI well while in hospital, but was not given Rx on discharge. Discussed that chronic benzodiazepines would not be good care for him; patient understands and is in agreement. No current SI or history of symptoms suggestive of bipolar disorder. There was one prior behavioral health hospitalization but records are not available and patient is unable to recall details.  Will cautiously start low dose SSRI of sertraline 25mg  daily (tolerated this dose in hospital). Counseled to contact us immediately if he develops thoughts of hurting himself or others, or develops symptoms of mania. Follow up in 1 week with Dr. Tarry Kos (already scheduled in [facility] clinic).

## 2019-09-02 NOTE — Assessment & Plan Note (Signed)
Discussed with patient that he likely needs to keep cholecystotomy tube in place, but that he can discuss with IR when he sees them next for exchange.

## 2019-09-08 ENCOUNTER — Ambulatory Visit: Payer: Medicare HMO

## 2019-09-08 NOTE — Progress Notes (Deleted)
   Subjective:   Patient ID: Glenn [name]    DOB: 12-17-1950, 69 y.o. male   MRN: [id number]  Glenn [name] is a 69 y.o. male with a history of *** here for ***  Worsening Renal Function: Patient presents today to further evaluate worsening kidney function. I drew labs on 7/7 which was notable for worsening kidney function (Cr 1.89>2.27) without any clear cause as he was improving from his prior hospitalization. Medications reviewed and currently only on Torsemide. Patient returns today for further evaluation. He had follow up BMP with notable improvement in Cr to 1.89, GFR 42. Baseline appears to be 1.1-1.5.  Risk factors for worsening heart failure, HTN, HLD, history of Hepatitis C, history of stroke, and recent cardiogenic shock with max Cr of 3.0.    Some concern for need to adjust mediations due to kidney function. In regards to urination, patient notes ***  Baseline Cr between 1.1-1.5. Last creatine improved to 1.89.   Review of Systems:  Per HPI.   Objective:   There were no vitals taken for this visit. Vitals and nursing note reviewed.  General: well nourished, well developed, in no acute distress with non-toxic appearance HEENT: normocephalic, atraumatic, moist mucous membranes Neck: supple, non-tender without lymphadenopathy CV: regular rate and rhythm without murmurs, rubs, or gallops, no lower extremity edema Lungs: clear to auscultation bilaterally with normal work of breathing Abdomen: soft, non-tender, non-distended, no masses or organomegaly palpable, normoactive bowel sounds Skin: warm, dry, no rashes or lesions Extremities: warm and well perfused, normal tone MSK: ROM grossly intact, strength intact, gait normal Neuro: Alert and oriented, speech normal  Volume status abominal bruit  Assessment & Plan:   No problem-specific Assessment & Plan notes found for this encounter.  No orders of the defined types were placed in this encounter.  No orders  of the defined types were placed in this encounter.  Can consider urine and protein electrophoresis   Glenn Marble, DO PGY-3, [facility] 09/08/2019 7:33 AM

## 2019-09-09 ENCOUNTER — Ambulatory Visit (INDEPENDENT_AMBULATORY_CARE_PROVIDER_SITE_OTHER): Payer: Medicare HMO | Admitting: Family Medicine

## 2019-09-09 ENCOUNTER — Other Ambulatory Visit: Payer: Self-pay

## 2019-09-09 VITALS — BP 144/80 | HR 69 | Wt 159.4 lb

## 2019-09-09 DIAGNOSIS — I10 Essential (primary) hypertension: Secondary | ICD-10-CM | POA: Diagnosis not present

## 2019-09-09 DIAGNOSIS — N1832 Chronic kidney disease, stage 3b: Secondary | ICD-10-CM

## 2019-09-09 LAB — POCT URINALYSIS DIP (MANUAL ENTRY)
Bilirubin, UA: NEGATIVE
Blood, UA: NEGATIVE
Glucose, UA: NEGATIVE mg/dL
Ketones, POC UA: NEGATIVE mg/dL
Leukocytes, UA: NEGATIVE
Nitrite, UA: NEGATIVE
Protein Ur, POC: NEGATIVE mg/dL
Spec Grav, UA: 1.015 (ref 1.010–1.025)
Urobilinogen, UA: 0.2 E.U./dL
pH, UA: 5.5 (ref 5.0–8.0)

## 2019-09-09 MED ORDER — AMLODIPINE BESYLATE 5 MG PO TABS: 5.0000 mg | ORAL_TABLET | Freq: Every day | ORAL | 3 refills | Status: DC

## 2019-09-09 NOTE — Patient Instructions (Signed)
It was nice to see you today,  I will refer you to the nephrologist for consultation on your chronic kidney disease.  We will get some urine test today and you can discuss the results of this with your primary care provider.  I will start you on amlodipine today for your elevated blood pressure.  You should follow-up with your primary care provider, Dr. Janus Molder, for further work-up of these chronic issues.  Have a great day,  Clemetine Marker, MD

## 2019-09-09 NOTE — Progress Notes (Signed)
    SUBJECTIVE:   CHIEF COMPLAINT / HPI:   Kidney disease: pt states he is here today to discuss recent lab tests regarding his kidney function.  We discussed what CKD is and also AKI and how the two relate to each other.  Also discussed common causes of CKD.  Pt is okay with being referred to a nephrologist for further discussion of his CKD.   PERTINENT  PMH / PSH: afib, HTN,   OBJECTIVE:   BP (!) 144/80   Pulse 69   Wt 159 lb 6.4 oz (72.3 kg)   SpO2 93%   BMI 24.97 kg/m   Gen: alert, no acute distress.  Appears stated age.   Cv: RRR. No murmurs.   Pulm: LCTAB.  No wheezes.   Ext: no LE edema.    ASSESSMENT/PLAN:   CKD (chronic kidney disease) stage 3, GFR 30-59 ml/min Originally believed to be AKI, pt has had several BMPs in which the creatinine and GFR are stable in the stage 3b zone.  Likely due to hypertension. We will refer to nephrology.  Will start amlodipine today for HTN.  Will need to follow up in a few weeks for recheck.  Will get urine labs today.    HTN (hypertension) Currently not on medication for it since his hospitalization.  He is taking digoxin and eliquis for his afib.  Will start on amlodipine 5mg  and recheck in a few weeks.       Benay Pike, MD [place]

## 2019-09-10 ENCOUNTER — Other Ambulatory Visit: Payer: Self-pay | Admitting: Family Medicine

## 2019-09-10 ENCOUNTER — Telehealth: Payer: Self-pay

## 2019-09-10 NOTE — Assessment & Plan Note (Addendum)
Originally believed to be AKI, pt has had several BMPs in which the creatinine and GFR are stable in the stage 3b zone.  Likely due to hypertension. We will refer to nephrology.  Will start amlodipine today for HTN.  Will need to follow up in a few weeks for recheck.  Will get urine labs today.

## 2019-09-10 NOTE — Telephone Encounter (Signed)
It appears Mr. Purohit was seen in clinic by Dr. Ardelia Mems 09/01/2019 and discussed following up with interventional radiology about his chole drain. Per radiology note 07/16/19 he needs to keep the drain and should have routine drain changes. If she would like she can call [phone] to further discuss.   [place], DO 09/10/2019, 3:39 PM

## 2019-09-10 NOTE — Assessment & Plan Note (Signed)
Currently not on medication for it since his hospitalization.  He is taking digoxin and eliquis for his afib.  Will start on amlodipine 5mg  and recheck in a few weeks.

## 2019-09-10 NOTE — Telephone Encounter (Signed)
[name], RN with [facility], calls nurse line to report a leak in RUQ drain. Patient has reportedly had drain for over a year. Has been advised to report to the ED for replacement. [name] wanted to reach out to confirm long term plan for patient regarding drain. Would like to know how long drain is supposed to remain in place and when patient is to proceed with surgery.   Patient is being discharged from home health nursing next week. RN just wanted to be sure everyone was on the same page regarding drain status.   To PCP  Talbot Grumbling, RN

## 2019-09-11 LAB — PROTEIN / CREATININE RATIO, URINE
Creatinine, Urine: 60.8 mg/dL
Protein, Ur: 4.3 mg/dL
Protein/Creat Ratio: 71 mg/g creat (ref 0–200)

## 2019-09-12 ENCOUNTER — Other Ambulatory Visit: Payer: Self-pay

## 2019-09-12 ENCOUNTER — Emergency Department (HOSPITAL_COMMUNITY)
Admission: EM | Admit: 2019-09-12 | Discharge: 2019-09-12 | Disposition: A | Discharge status: Home / Self Care | Payer: Medicare HMO | Attending: Emergency Medicine | Admitting: Emergency Medicine

## 2019-09-12 DIAGNOSIS — Z5321 Procedure and treatment not carried out due to patient leaving prior to being seen by health care provider: Secondary | ICD-10-CM | POA: Insufficient documentation

## 2019-09-12 DIAGNOSIS — R1011 Right upper quadrant pain: Secondary | ICD-10-CM | POA: Diagnosis not present

## 2019-09-12 NOTE — ED Triage Notes (Signed)
Pt sent here for replacement drain in RUQ. Sts he accidentally cut the draining tube with scissors 2 days ago. Sts he has the drain for his gallbladder since he cannot have surgery.

## 2019-09-12 NOTE — ED Notes (Signed)
Called pt x3 for vital recheck no answer

## 2019-09-14 NOTE — Telephone Encounter (Signed)
Called and LVM with [name] regarding patient and chole drain. Provided information per note below. Advised to contact with further questions.   Talbot Grumbling, RN

## 2019-09-21 ENCOUNTER — Other Ambulatory Visit: Payer: Self-pay

## 2019-09-21 ENCOUNTER — Emergency Department (HOSPITAL_COMMUNITY)
Admission: EM | Admit: 2019-09-21 | Discharge: 2019-09-21 | Disposition: A | Discharge status: Home / Self Care | Payer: Medicare HMO | Attending: Emergency Medicine | Admitting: Emergency Medicine

## 2019-09-21 ENCOUNTER — Encounter (HOSPITAL_COMMUNITY): Payer: Self-pay | Admitting: Emergency Medicine

## 2019-09-21 DIAGNOSIS — I5043 Acute on chronic combined systolic (congestive) and diastolic (congestive) heart failure: Secondary | ICD-10-CM | POA: Insufficient documentation

## 2019-09-21 DIAGNOSIS — T85520A Displacement of bile duct prosthesis, initial encounter: Secondary | ICD-10-CM

## 2019-09-21 DIAGNOSIS — Z79899 Other long term (current) drug therapy: Secondary | ICD-10-CM | POA: Insufficient documentation

## 2019-09-21 DIAGNOSIS — N183 Chronic kidney disease, stage 3 unspecified: Secondary | ICD-10-CM | POA: Diagnosis not present

## 2019-09-21 DIAGNOSIS — I130 Hypertensive heart and chronic kidney disease with heart failure and stage 1 through stage 4 chronic kidney disease, or unspecified chronic kidney disease: Secondary | ICD-10-CM | POA: Insufficient documentation

## 2019-09-21 DIAGNOSIS — T85628A Displacement of other specified internal prosthetic devices, implants and grafts, initial encounter: Secondary | ICD-10-CM | POA: Diagnosis not present

## 2019-09-21 DIAGNOSIS — Z87891 Personal history of nicotine dependence: Secondary | ICD-10-CM | POA: Diagnosis not present

## 2019-09-21 LAB — BASIC METABOLIC PANEL
Anion gap: 9 (ref 5–15)
BUN: 19 mg/dL (ref 8–23)
CO2: 28 mmol/L (ref 22–32)
Calcium: 9.3 mg/dL (ref 8.9–10.3)
Chloride: 100 mmol/L (ref 98–111)
Creatinine, Ser: 1.97 mg/dL — ABNORMAL HIGH (ref 0.61–1.24)
GFR calc Af Amer: 40 mL/min — ABNORMAL LOW (ref >60)
GFR calc non Af Amer: 34 mL/min — ABNORMAL LOW (ref >60)
Glucose, Bld: 99 mg/dL (ref 70–99)
Potassium: 4.1 mmol/L (ref 3.5–5.1)
Sodium: 137 mmol/L (ref 135–145)

## 2019-09-21 LAB — CBC
HCT: 36.1 % — ABNORMAL LOW (ref 39.0–52.0)
Hemoglobin: 11.8 g/dL — ABNORMAL LOW (ref 13.0–17.0)
MCH: 31.6 pg (ref 26.0–34.0)
MCHC: 32.7 g/dL (ref 30.0–36.0)
MCV: 96.5 fL (ref 80.0–100.0)
Platelets: 173 10*3/uL (ref 150–400)
RBC: 3.74 MIL/uL — ABNORMAL LOW (ref 4.22–5.81)
RDW: 12.8 % (ref 11.5–15.5)
WBC: 3.7 10*3/uL — ABNORMAL LOW (ref 4.0–10.5)
nRBC: 0 % (ref 0.0–0.2)

## 2019-09-21 MED ORDER — SODIUM CHLORIDE 0.9% FLUSH: 3.0000 mL | Freq: Once | INTRAVENOUS | Status: DC

## 2019-09-21 NOTE — ED Triage Notes (Signed)
Patient arrives to ED with complaints of needing his drain replaced in his RUQ of his stomach. Patient has had drain in place for gallstones since September. He accidentally cu tit on 7/22. No pain at site.

## 2019-09-21 NOTE — ED Provider Notes (Signed)
[facility] EMERGENCY DEPARTMENT Provider Note   CSN: [id number] Arrival date & time: 09/21/19  6546     History Chief Complaint  Patient presents with  . Drain Replacement    Glenn [name] is a 69 y.o. male.  Pt presents to the ED today with a cut to his biliary drain.  Pt said he has had a biliary drain for several months.  On 7/22, he had a lot of tape around the drain and accidentally cut the drain itself when he was trying to cut the tape.        Past Medical History:  Diagnosis Date  . Acute blood loss anemia   . Acute CVA (cerebrovascular accident) ([place])   . Acute deep vein thrombosis (DVT) of both lower extremities ([name])   . Acute kidney injury ([place])   . Acute on chronic combined systolic and diastolic CHF (congestive heart failure) ([place])   . Acute renal failure superimposed on stage 3a chronic kidney disease ([place])   . Acute respiratory failure with hypoxia ([place])   . Atrial fibrillation ([place])   . Carpal tunnel syndrome of right wrist 02/28/2018  . Cerebral edema ([place]) 11/13/2018  . Cerebral infarction ([place])   . CHF (congestive heart failure) ([place])   . Cholecystitis 02/04/2019  . Chronic right hip pain   . DCM (dilated cardiomyopathy) ([place])   . Dysphagia, post-stroke   . Elevated troponin   . Entrapment of right ulnar nerve 02/28/2018  . Headache due to intracranial disease 11/14/2018  . Hepatitis C   . History of hemorrhagic stroke with residual hemiparesis ([place]) 02/04/2019  . HTN (hypertension) 08/14/2016  . Hyperlipidemia LDL goal <70 11/13/2018  . Hypertension   . ICD (implantable cardioverter-defibrillator) in place 09/13/2016  . Impotence due to erectile dysfunction 09/30/2017  . Ischemic cardiomyopathy   . Labile blood glucose   . Left leg DVT ([place]) 02/04/2019  . Marijuana user 11/13/2018  . Paroxysmal atrial fibrillation ([name])   . Right middle cerebral artery stroke ([place]) 11/13/2018  . Solitary pulmonary nodule 06/10/2017   5 mm RUL  nodule noted incidentally as part of CVA workup 08/2016. With smoking history would obtain low-dose CT scan 08/2017.   . Stroke (cerebrum) ([name]) Lg L MCA infarct w/ hemorrhagic conversion, embolic d/t AF 06/22/5463  . Stroke ([place])   . Trochanteric bursitis, right hip 11/14/2018  . Visit for monitoring Tikosyn therapy 03/26/2017    Patient Active Problem List   Diagnosis Date Noted  . Anxiety 09/02/2019  . Malnutrition of moderate degree 07/14/2019  . Palliative care by specialist   . Atrial fibrillation with RVR ([place]) 06/23/2019  . Chronic systolic heart failure ([place]) 06/21/2019  . Persistent atrial fibrillation with RVR ([place])   . Thrombus of left atrial appendage   . Left leg pain 03/27/2019  . History of hemorrhagic stroke with residual hemiparesis ([place]) 02/04/2019  . Left leg DVT ([place]) 02/04/2019  . Chronic cholecystitis 02/04/2019  . Acute deep vein thrombosis (DVT) of both lower extremities ([name])   . Acute blood loss anemia   . Labile blood glucose   . CKD (chronic kidney disease) stage 3, GFR 30-59 ml/min   . Chronic right hip pain   . Headache due to intracranial disease 11/14/2018  . Trochanteric bursitis, right hip 11/14/2018  . Cerebral edema ([place]) 11/13/2018  . Hyperlipidemia LDL goal <70 11/13/2018  . Marijuana user 11/13/2018  . Right middle cerebral artery stroke ([place]) 11/13/2018  . Acute CVA (cerebrovascular  accident) ([place])   . Noncompliance   . Dysphagia, post-stroke   . Acute on chronic combined systolic and diastolic CHF (congestive heart failure) ([place])   . DCM (dilated cardiomyopathy) ([place])   . Atrial fibrillation ([place]) 11/04/2018  . Elevated troponin   . Acute respiratory failure with hypoxia ([place])   . Entrapment of right ulnar nerve 02/28/2018  . Carpal tunnel syndrome of right wrist 02/28/2018  . Impotence due to erectile dysfunction 09/30/2017  . Solitary pulmonary nodule 06/10/2017  . Neck pain 04/06/2017  . Paroxysmal atrial fibrillation ([name])   . ICD  (implantable cardioverter-defibrillator) in place 09/13/2016  . Chest pain 09/13/2016  . Tobacco abuse 09/13/2016  . Hospital discharge follow-up 09/13/2016  . Upper back pain 09/13/2016  . Housing problems 09/13/2016  . Ischemic cardiomyopathy   . Cerebral infarction ([place])   . Stroke (cerebrum) ([name]) Lg L MCA infarct w/ hemorrhagic conversion, embolic d/t AF 41/32/4401  . HTN (hypertension) 08/14/2016  . Chronic Hepatitis C  08/14/2016    Past Surgical History:  Procedure Laterality Date  . CARDIAC DEFIBRILLATOR PLACEMENT  2015  . CARDIOVERSION N/A 10/10/2016   Procedure: CARDIOVERSION;  Surgeon: Dorothy Spark, MD;  Location: [facility];  Service: Cardiovascular;  Laterality: N/A;  . CARDIOVERSION N/A 03/27/2017   Procedure: CARDIOVERSION;  Surgeon: Jerline Pain, MD;  Location: [facility];  Service: Cardiovascular;  Laterality: N/A;  . CARDIOVERSION N/A 10/29/2018   Procedure: CARDIOVERSION;  Surgeon: Sanda Klein, MD;  Location: [facility] ENDOSCOPY;  Service: Cardiovascular;  Laterality: N/A;  . CARDIOVERSION N/A 11/05/2018   Procedure: CARDIOVERSION;  Surgeon: Thayer Headings, MD;  Location: [facility]OSCOPY;  Service: Cardiovascular;  Laterality: N/A;  . CARDIOVERSION N/A 07/02/2019   Procedure: CARDIOVERSION;  Surgeon: Jolaine Artist, MD;  Location: [facility];  Service: Cardiovascular;  Laterality: N/A;  . CARDIOVERSION N/A 07/14/2019   Procedure: CARDIOVERSION;  Surgeon: Jolaine Artist, MD;  Location: [facility];  Service: Cardiovascular;  Laterality: N/A;  . EYE SURGERY Left 1990  . IABP INSERTION N/A 06/26/2019   Procedure: IABP INSERTION;  Surgeon: Jolaine Artist, MD;  Location: [facility] CV LAB;  Service: Cardiovascular;  Laterality: N/A;  . IR CATHETER TUBE CHANGE  06/24/2019  . IR EXCHANGE BILIARY DRAIN  02/11/2019  . IR EXCHANGE BILIARY DRAIN  06/09/2019  . IR IVC FILTER PLMT / S&I /IMG GUID/MOD SED  12/04/2018  . IR PERC CHOLECYSTOSTOMY  12/13/2018  . IR  PERCUTANEOUS ART THROMBECTOMY/INFUSION INTRACRANIAL INC DIAG ANGIO  09/05/2016  . IR RADIOLOGIST EVAL & MGMT  10/03/2016  . RADIOLOGY WITH ANESTHESIA N/A 09/05/2016   Procedure: RADIOLOGY WITH ANESTHESIA;  Surgeon: Luanne Bras, MD;  Location: [facility];  Service: Radiology;  Laterality: N/A;  . RIGHT HEART CATH N/A 06/24/2019   Procedure: RIGHT HEART CATH;  Surgeon: Jolaine Artist, MD;  Location: [facility] CV LAB;  Service: Cardiovascular;  Laterality: N/A;  . RIGHT/LEFT HEART CATH AND CORONARY ANGIOGRAPHY N/A 11/03/2018   Procedure: RIGHT/LEFT HEART CATH AND CORONARY ANGIOGRAPHY;  Surgeon: Lorretta Harp, MD;  Location: [facility] CV LAB;  Service: Cardiovascular;  Laterality: N/A;  . TEE WITHOUT CARDIOVERSION N/A 02/06/2019   Procedure: TRANSESOPHAGEAL ECHOCARDIOGRAM (TEE);  Surgeon: Skeet Latch, MD;  Location: [facility];  Service: Cardiovascular;  Laterality: N/A;  . TEE WITHOUT CARDIOVERSION N/A 06/09/2019   Procedure: TRANSESOPHAGEAL ECHOCARDIOGRAM (TEE);  Surgeon: Buford Dresser, MD;  Location[facility] ENDOSCOPY;  Service: Cardiovascular;  Laterality: N/A;       Family History  Problem Relation Age of Onset  . High blood pressure Mother   . High blood pressure Father   . Stroke Maternal Aunt   . Heart disease Neg Hx     Social History   Tobacco Use  . Smoking status: Former Smoker    Packs/day: 0.50    Types: Cigarettes  . Smokeless tobacco: Never Used  . Tobacco comment: a pack last three days  Vaping Use  . Vaping Use: Never used  Substance Use Topics  . Alcohol use: Not Currently    Alcohol/week: 3.0 standard drinks    Types: 3 Cans of beer per week    Comment: pt stop drinking   . Drug use: Not Currently    Frequency: 2.0 times per week    Types: Marijuana    Comment: stop smoking     Home Medications Prior to Admission medications   Medication Sig Start Date End Date Taking? Authorizing Provider  acetaminophen (TYLENOL) 325 MG tablet Take  2 tablets (650 mg total) by mouth every 6 (six) hours as needed for mild pain (or Fever >/= 101). 07/16/19   Clegg, Amy D, NP  ALPRAZolam (XANAX) 0.25 MG tablet Take 1 tablet (0.25 mg total) by mouth 2 (two) times daily as needed for anxiety. Patient not taking: Reported on 09/01/2019 07/16/19   Darrick Grinder D, NP  amiodarone (PACERONE) 200 MG tablet Take 1 tablet (200 mg total) by mouth every 8 (eight) hours. 07/16/19   Clegg, Amy D, NP  amLODipine (NORVASC) 5 MG tablet Take 1 tablet (5 mg total) by mouth at bedtime. 09/09/19   Benay Pike, MD  apixaban (ELIQUIS) 5 MG TABS tablet Take 1 tablet (5 mg total) by mouth 2 (two) times daily. 07/16/19   Clegg, Amy D, NP  atorvastatin (LIPITOR) 20 MG tablet TAKE 1 TABLET(20 MG) BY MOUTH DAILY AT 6 PM Patient taking differently: Take 20 mg by mouth every evening.  03/13/19   [name], MD  calcium carbonate (TUMS - DOSED IN MG ELEMENTAL CALCIUM) 500 MG chewable tablet Chew 1 tablet (200 mg of elemental calcium total) by mouth daily as needed for indigestion or heartburn. 03/12/19   [name], MD  cephALEXin (KEFLEX) 500 MG capsule Take 1 capsule (500 mg total) by mouth 4 (four) times daily. 08/14/19   Mesner, Corene Cornea, MD  digoxin (LANOXIN) 0.125 MG tablet Take 1 tablet (0.125 mg total) by mouth daily. 08/11/19   Baldwin Jamaica, PA-C  docusate sodium (COLACE) 100 MG capsule Take 1 capsule (100 mg total) by mouth 2 (two) times daily. 07/16/19   Clegg, Amy D, NP  gabapentin (NEURONTIN) 300 MG capsule Take 2 capsules (600 mg total) by mouth 3 (three) times daily. 09/11/19   Autry-Lott, Naaman Plummer, DO  levalbuterol (XOPENEX) 0.63 MG/3ML nebulizer solution Take 3 mLs (0.63 mg total) by nebulization every 6 (six) hours as needed for wheezing or shortness of breath. 07/16/19   Clegg, Amy D, NP  metoCLOPramide (REGLAN) 5 MG tablet Take 1 tablet (5 mg total) by mouth 3 (three) times daily. 07/16/19   Clegg, Amy D, NP  ondansetron (ZOFRAN) 4 MG tablet Take 1 tablet (4 mg  total) by mouth every 6 (six) hours as needed for nausea. 07/16/19   Clegg, Amy D, NP  pantoprazole (PROTONIX) 40 MG tablet Take 1 tablet (40 mg total) by mouth daily. Patient not taking: Reported on 09/01/2019 07/16/19   Darrick Grinder D, NP  potassium chloride SA (KLOR-CON) 20 MEQ tablet Take 2  tablets (40 mEq total) by mouth daily. 07/17/19   Clegg, Amy D, NP  ranolazine (RANEXA) 500 MG 12 hr tablet Take 1 tablet (500 mg total) by mouth 2 (two) times daily. 07/16/19   Clegg, Amy D, NP  sertraline (ZOLOFT) 25 MG tablet Take 1 tablet (25 mg total) by mouth daily. 09/01/19   Leeanne Rio, MD  torsemide (DEMADEX) 20 MG tablet Take 2 tablets (40 mg total) by mouth 2 (two) times daily. 07/16/19   Clegg, Amy D, NP  traMADol (ULTRAM) 50 MG tablet Take 1 tablet (50 mg total) by mouth every 8 (eight) hours as needed for severe pain. Patient not taking: Reported on 09/01/2019 03/12/19   [name], MD    Allergies    Benadryl [diphenhydramine]  Review of Systems   Review of Systems  Gastrointestinal:       IR drain tip cut    Physical Exam Updated Vital Signs BP (!) 162/66 (BP Location: Left Arm)   Pulse (!) 58   Temp 99 F (37.2 C) (Oral)   Resp 16   SpO2 99%   Physical Exam Vitals and nursing note reviewed.  Constitutional:      Appearance: Normal appearance.  HENT:     Head: Normocephalic and atraumatic.     Right Ear: External ear normal.     Left Ear: External ear normal.     Nose: Nose normal.     Mouth/Throat:     Mouth: Mucous membranes are moist.     Pharynx: Oropharynx is clear.  Eyes:     Extraocular Movements: Extraocular movements intact.     Conjunctiva/sclera: Conjunctivae normal.     Pupils: Pupils are equal, round, and reactive to light.  Cardiovascular:     Rate and Rhythm: Normal rate and regular rhythm.     Pulses: Normal pulses.     Heart sounds: Normal heart sounds.  Pulmonary:     Effort: Pulmonary effort is normal.     Breath sounds: Normal breath  sounds.  Abdominal:     General: Abdomen is flat. Bowel sounds are normal.     Palpations: Abdomen is soft.  Musculoskeletal:     Cervical back: Normal range of motion and neck supple.     Comments: Biliary drain cut  Skin:    General: Skin is warm.     Capillary Refill: Capillary refill takes less than 2 seconds.  Neurological:     General: No focal deficit present.     Mental Status: He is alert and oriented to person, place, and time.  Psychiatric:        Mood and Affect: Mood normal.        Behavior: Behavior normal.     ED Results / Procedures / Treatments   Labs (all labs ordered are listed, but only abnormal results are displayed) Labs Reviewed  BASIC METABOLIC PANEL - Abnormal; Notable for the following components:      Result Value   Creatinine, Ser 1.97 (*)    GFR calc non Af Amer 34 (*)    GFR calc Af Amer 40 (*)    All other components within normal limits  CBC - Abnormal; Notable for the following components:   WBC 3.7 (*)    RBC 3.74 (*)    Hemoglobin 11.8 (*)    HCT 36.1 (*)    All other components within normal limits    EKG None  Radiology No results found.  Procedures Procedures (including critical care  time)  Medications Ordered in ED Medications  sodium chloride flush (NS) 0.9 % injection 3 mL (has no administration in time range)    ED Course  I have reviewed the triage vital signs and the nursing notes.  Pertinent labs & imaging results that were available during my care of the patient were reviewed by me and considered in my medical decision making (see chart for details).    MDM Rules/Calculators/A&P                          Pt d/w Dr. Kathlene Cote (IR).  He will have the office call the patient in the morning to fit him in to get his drain changed tomorrow.  This is told to the patient and his son, [name].  Pt is stable for d/c.  Return if worse.  Final Clinical Impression(s) / ED Diagnoses Final diagnoses:  Biliary drain  displacement, initial encounter    Rx / DC Orders ED Discharge Orders    None       Isla Pence, MD 09/21/19 1844

## 2019-09-23 ENCOUNTER — Other Ambulatory Visit (HOSPITAL_COMMUNITY): Payer: Self-pay | Admitting: Interventional Radiology

## 2019-09-23 ENCOUNTER — Ambulatory Visit (HOSPITAL_COMMUNITY)
Admission: RE | Admit: 2019-09-23 | Discharge: 2019-09-23 | Disposition: A | Discharge status: Home / Self Care | Payer: Medicare HMO | Source: Ambulatory Visit | Attending: Radiology | Admitting: Radiology

## 2019-09-23 ENCOUNTER — Other Ambulatory Visit (HOSPITAL_COMMUNITY): Payer: Self-pay | Admitting: Radiology

## 2019-09-23 DIAGNOSIS — K819 Cholecystitis, unspecified: Secondary | ICD-10-CM

## 2019-09-23 DIAGNOSIS — Z4803 Encounter for change or removal of drains: Secondary | ICD-10-CM | POA: Insufficient documentation

## 2019-09-23 HISTORY — PX: IR EXCHANGE BILIARY DRAIN: IMG6046

## 2019-09-23 MED ORDER — LIDOCAINE HCL 1 % IJ SOLN
INTRAMUSCULAR | Status: DC | PRN
  Administered 2019-09-23: 10 mL

## 2019-09-23 MED ORDER — IOHEXOL 300 MG/ML  SOLN
50.0000 mL | Freq: Once | INTRAMUSCULAR | Status: AC | PRN
  Administered 2019-09-23: 15 mL

## 2019-09-23 MED ORDER — LIDOCAINE HCL 1 % IJ SOLN
INTRAMUSCULAR | Status: AC
  Filled 2019-09-23: qty 20

## 2019-09-23 NOTE — Procedures (Signed)
Interventional Radiology Procedure Note  Procedure: Image guided drain replacement, perc chole.   The 47F drain would not form completely in the GB, thus, 67F drain was placed. GB appears contracted/scarred  Complications: None  EBL: None    Recommendations: - Routine drain care,    Signed,  Dulcy Fanny. Earleen Newport, DO   c

## 2019-09-24 ENCOUNTER — Ambulatory Visit: Payer: Medicare HMO | Admitting: Family Medicine

## 2019-09-24 ENCOUNTER — Other Ambulatory Visit: Payer: Self-pay | Admitting: Internal Medicine

## 2019-09-24 MED ORDER — APIXABAN 5 MG PO TABS: 5.0000 mg | ORAL_TABLET | Freq: Two times a day (BID) | ORAL | 1 refills | Status: DC

## 2019-09-24 NOTE — Telephone Encounter (Signed)
   *  STAT* If patient is at the pharmacy, call can be transferred to refill team.   1. Which medications need to be refilled? (please list name of each medication and dose if known) apixaban (ELIQUIS) 5 MG TABS tablet  2. Which pharmacy/location (including street and city if local pharmacy) is medication to be sent to? [facility] [phone] - [place], [place] - [address] AT [place]  3. Do they need a 30 day or 90 day supply? 90 days   Pt is out of medication, he needs it as soon as possible

## 2019-09-24 NOTE — Telephone Encounter (Signed)
Last OV 06/16/19 69 years old 72kg Scr 1.97 on 09/21/19 Eliquis 5mg  BID sent to pharmacy

## 2019-09-28 ENCOUNTER — Other Ambulatory Visit: Payer: Self-pay | Admitting: Internal Medicine

## 2019-09-28 NOTE — Telephone Encounter (Signed)
Pt's pharmacy is requesting a refill on metoclopramide. Would Dr. [name] like to refill this medication? Please address

## 2019-10-15 ENCOUNTER — Encounter: Payer: Medicare HMO | Admitting: Nurse Practitioner

## 2019-10-15 NOTE — Progress Notes (Deleted)
Electrophysiology Office Note Date: 10/15/2019  ID:  [name], DOB [birth year], MRN [id number]  PCP: Gerlene Fee, DO Primary Cardiologist: [place] Electrophysiologist: [name]  CC: Routine ICD follow-up  Glenn [name] is a 69 y.o. male seen today for Dr [name].  He presents today for routine electrophysiology followup.  Since last being seen in our clinic, the patient reports doing very well. He denies chest pain, palpitations, dyspnea, PND, orthopnea, nausea, vomiting, dizziness, syncope, edema, weight gain, or early satiety.  He has not had ICD shocks.   Device History: Research officer, political party ICD implanted 12/2013 for NICM History of appropriate therapy: No History of AAD therapy: Yes   Past Medical History:  Diagnosis Date   Acute blood loss anemia    Acute CVA (cerebrovascular accident) ([place])    Acute deep vein thrombosis (DVT) of both lower extremities ([place])    Acute kidney injury ([place])    Acute on chronic combined systolic and diastolic CHF (congestive heart failure) ([name])    Acute renal failure superimposed on stage 3a chronic kidney disease ([name])    Acute respiratory failure with hypoxia ([name])    Atrial fibrillation ([name])    Carpal tunnel syndrome of right wrist 02/28/2018   Cerebral edema ([place]) 11/13/2018   Cerebral infarction ([name])    CHF (congestive heart failure) ([place])    Cholecystitis 02/04/2019   Chronic right hip pain    DCM (dilated cardiomyopathy) ([name])    Dysphagia, post-stroke    Elevated troponin    Entrapment of right ulnar nerve 02/28/2018   Headache due to intracranial disease 11/14/2018   Hepatitis C    History of hemorrhagic stroke with residual hemiparesis ([place]) 02/04/2019   HTN (hypertension) 08/14/2016   Hyperlipidemia LDL goal <70 11/13/2018   Hypertension    ICD (implantable cardioverter-defibrillator) in place 09/13/2016   Impotence due to erectile dysfunction 09/30/2017   Ischemic  cardiomyopathy    Labile blood glucose    Left leg DVT ([place]) 02/04/2019   Marijuana user 11/13/2018   Paroxysmal atrial fibrillation ([name])    Right middle cerebral artery stroke ([place]) 11/13/2018   Solitary pulmonary nodule 06/10/2017   5 mm RUL nodule noted incidentally as part of CVA workup 08/2016. With smoking history would obtain low-dose CT scan 08/2017.    Stroke (cerebrum) ([place]) Lg L MCA infarct w/ hemorrhagic conversion, embolic d/t AF 2/86/3817   Stroke ([place])    Trochanteric bursitis, right hip 11/14/2018   Visit for monitoring Tikosyn therapy 03/26/2017   Past Surgical History:  Procedure Laterality Date   CARDIAC DEFIBRILLATOR PLACEMENT  2015   CARDIOVERSION N/A 10/10/2016   Procedure: CARDIOVERSION;  Surgeon: Dorothy Spark, MD;  Location: [place];  Service: Cardiovascular;  Laterality: N/A;   CARDIOVERSION N/A 03/27/2017   Procedure: CARDIOVERSION;  Surgeon: Jerline Pain, MD;  Location: [facility];  Service: Cardiovascular;  Laterality: N/A;   CARDIOVERSION N/A 10/29/2018   Procedure: CARDIOVERSION;  Surgeon: Sanda Klein, MD;  Location: [name] [facility];  Service: Cardiovascular;  Laterality: N/A;   CARDIOVERSION N/A 11/05/2018   Procedure: CARDIOVERSION;  Surgeon: Thayer Headings, MD;  Location: [facility];  Service: Cardiovascular;  Laterality: N/A;   CARDIOVERSION N/A 07/02/2019   Procedure: CARDIOVERSION;  Surgeon: Jolaine Artist, MD;  Location: [place];  Service: Cardiovascular;  Laterality: N/A;   CARDIOVERSION N/A 07/14/2019   Procedure: CARDIOVERSION;  Surgeon: Jolaine Artist, MD;  Location: [place];  Service: Cardiovascular;  Laterality: N/A;   EYE SURGERY Left  1990   IABP INSERTION N/A 06/26/2019   Procedure: IABP INSERTION;  Surgeon: Jolaine Artist, MD;  Location: [facility];  Service: Cardiovascular;  Laterality: N/A;   IR CATHETER TUBE CHANGE  06/24/2019   IR EXCHANGE BILIARY DRAIN  02/11/2019   IR EXCHANGE  BILIARY DRAIN  06/09/2019   IR EXCHANGE BILIARY DRAIN  09/23/2019   IR IVC FILTER PLMT / S&I /IMG GUID/MOD SED  12/04/2018   IR PERC CHOLECYSTOSTOMY  12/13/2018   IR PERCUTANEOUS ART THROMBECTOMY/INFUSION INTRACRANIAL INC DIAG ANGIO  09/05/2016   IR RADIOLOGIST EVAL & MGMT  10/03/2016   RADIOLOGY WITH ANESTHESIA N/A 09/05/2016   Procedure: RADIOLOGY WITH ANESTHESIA;  Surgeon: Luanne Bras, MD;  Location: [place];  Service: Radiology;  Laterality: N/A;   RIGHT HEART CATH N/A 06/24/2019   Procedure: RIGHT HEART CATH;  Surgeon: Jolaine Artist, MD;  Location: [place] [facility];  Service: Cardiovascular;  Laterality: N/A;   RIGHT/LEFT HEART CATH AND CORONARY ANGIOGRAPHY N/A 11/03/2018   Procedure: RIGHT/LEFT HEART CATH AND CORONARY ANGIOGRAPHY;  Surgeon: Lorretta Harp, MD;  Location: [place] [facility];  Service: Cardiovascular;  Laterality: N/A;   TEE WITHOUT CARDIOVERSION N/A 02/06/2019   Procedure: TRANSESOPHAGEAL ECHOCARDIOGRAM (TEE);  Surgeon: Skeet Latch, MD;  Location: [place];  Service: Cardiovascular;  Laterality: N/A;   TEE WITHOUT CARDIOVERSION N/A 06/09/2019   Procedure: TRANSESOPHAGEAL ECHOCARDIOGRAM (TEE);  Surgeon: Buford Dresser, MD;  Location: [facility] ENDOSCOPY;  Service: Cardiovascular;  Laterality: N/A;    Current Outpatient Medications  Medication Sig Dispense Refill   acetaminophen (TYLENOL) 325 MG tablet Take 2 tablets (650 mg total) by mouth every 6 (six) hours as needed for mild pain (or Fever >/= 101).     ALPRAZolam (XANAX) 0.25 MG tablet Take 1 tablet (0.25 mg total) by mouth 2 (two) times daily as needed for anxiety. (Patient not taking: Reported on 09/01/2019) 30 tablet 0   amiodarone (PACERONE) 200 MG tablet Take 1 tablet (200 mg total) by mouth every 8 (eight) hours.     amLODipine (NORVASC) 5 MG tablet Take 1 tablet (5 mg total) by mouth at bedtime. 90 tablet 3   apixaban (ELIQUIS) 5 MG TABS tablet Take 1 tablet (5 mg total) by mouth  2 (two) times daily. 180 tablet 1   atorvastatin (LIPITOR) 20 MG tablet TAKE 1 TABLET(20 MG) BY MOUTH DAILY AT 6 PM (Patient taking differently: Take 20 mg by mouth every evening. ) 90 tablet 0   calcium carbonate (TUMS - DOSED IN MG ELEMENTAL CALCIUM) 500 MG chewable tablet Chew 1 tablet (200 mg of elemental calcium total) by mouth daily as needed for indigestion or heartburn. 30 tablet 1   cephALEXin (KEFLEX) 500 MG capsule Take 1 capsule (500 mg total) by mouth 4 (four) times daily. 40 capsule 0   digoxin (LANOXIN) 0.125 MG tablet Take 1 tablet (0.125 mg total) by mouth daily. 90 tablet 3   docusate sodium (COLACE) 100 MG capsule Take 1 capsule (100 mg total) by mouth 2 (two) times daily. 10 capsule 0   gabapentin (NEURONTIN) 300 MG capsule Take 2 capsules (600 mg total) by mouth 3 (three) times daily. 90 capsule 0   levalbuterol (XOPENEX) 0.63 MG/3ML nebulizer solution Take 3 mLs (0.63 mg total) by nebulization every 6 (six) hours as needed for wheezing or shortness of breath. 3 mL 12   metoCLOPramide (REGLAN) 5 MG tablet Take 1 tablet (5 mg total) by mouth 3 (three) times daily.  ondansetron (ZOFRAN) 4 MG tablet Take 1 tablet (4 mg total) by mouth every 6 (six) hours as needed for nausea. 20 tablet 0   pantoprazole (PROTONIX) 40 MG tablet Take 1 tablet (40 mg total) by mouth daily. (Patient not taking: Reported on 09/01/2019)     potassium chloride SA (KLOR-CON) 20 MEQ tablet TAKE 2 TABLETS BY MOUTH ONCE DAILY FOR LOW POTASSIUM LEVELS 180 tablet 2   ranolazine (RANEXA) 500 MG 12 hr tablet TAKE 1 TABLET BY MOUTH EVERY 12 HOURS FOR ATRIAL FIBRILLATION 180 tablet 2   sertraline (ZOLOFT) 25 MG tablet Take 1 tablet (25 mg total) by mouth daily. 30 tablet 0   torsemide (DEMADEX) 20 MG tablet Take 2 tablets (40 mg total) by mouth 2 (two) times daily.     traMADol (ULTRAM) 50 MG tablet Take 1 tablet (50 mg total) by mouth every 8 (eight) hours as needed for severe pain. (Patient not  taking: Reported on 09/01/2019) 30 tablet 0   No current facility-administered medications for this visit.    Allergies:   Benadryl [diphenhydramine]   Social History: Social History   Socioeconomic History   Marital status: Single    Spouse name: Not on file   Number of children: Not on file   Years of education: 67 (some college)   Highest education level: Not on file  Occupational History   Occupation: disability  Tobacco Use   Smoking status: Former Smoker    Packs/day: 0.50    Types: Cigarettes   Smokeless tobacco: Never Used   Tobacco comment: a pack last three days  Vaping Use   Vaping Use: Never used  Substance and Sexual Activity   Alcohol use: Not Currently    Alcohol/week: 3.0 standard drinks    Types: 3 Cans of beer per week    Comment: pt stop drinking    Drug use: Not Currently    Frequency: 2.0 times per week    Types: Marijuana    Comment: stop smoking    Sexual activity: Yes    Partners: Female    Birth control/protection: Condom  Other Topics Concern   Not on file  Social History Narrative   Not on file   Social Determinants of Health   Financial Resource Strain: Low Risk    Difficulty of Paying Living Expenses: Not very hard  Food Insecurity: No Food Insecurity   Worried About Charity fundraiser in the Last Year: Never true   Ran Out of Food in the Last Year: Never true  Transportation Needs: No Transportation Needs   Lack of Transportation (Medical): No   Lack of Transportation (Non-Medical): No  Physical Activity:    Days of Exercise per Week: Not on file   Minutes of Exercise per Session: Not on file  Stress:    Feeling of Stress : Not on file  Social Connections:    Frequency of Communication with Friends and Family: Not on file   Frequency of Social Gatherings with Friends and Family: Not on file   Attends Religious Services: Not on file   Active Member of Clubs or Organizations: Not on file   Attends  Archivist Meetings: Not on file   Marital Status: Not on file  Intimate Partner Violence:    Fear of Current or Ex-Partner: Not on file   Emotionally Abused: Not on file   Physically Abused: Not on file   Sexually Abused: Not on file    Family History: Family History  Problem Relation Age of Onset   High blood pressure Mother    High blood pressure Father    Stroke Maternal Aunt    Heart disease Neg Hx     Review of Systems: All other systems reviewed and are otherwise negative except as noted above.   Physical Exam: VS:  There were no vitals taken for this visit. , BMI There is no height or weight on file to calculate BMI.  GEN- The patient is well appearing, alert and oriented x 3 today.   HEENT: normocephalic, atraumatic; sclera clear, conjunctiva pink; hearing intact; oropharynx clear; neck supple, no JVP Lymph- no cervical lymphadenopathy Lungs- Clear to ausculation bilaterally, normal work of breathing.  No wheezes, rales, rhonchi Heart- Regular rate and rhythm, no murmurs, rubs or gallops, PMI not laterally displaced GI- soft, non-tender, non-distended, bowel sounds present, no hepatosplenomegaly Extremities- no clubbing, cyanosis, or edema; DP/PT/radial pulses 2+ bilaterally MS- no significant deformity or atrophy Skin- warm and dry, no rash or lesion; ICD pocket well healed Psych- euthymic mood, full affect Neuro- strength and sensation are intact  ICD interrogation- reviewed in detail today,  See PACEART report  EKG:  EKG is ordered today. The ekg ordered today shows ***  Recent Labs: 10/31/2018: TSH 2.566 06/23/2019: B Natriuretic Peptide 613.2 07/09/2019: Magnesium 2.0 08/26/2019: ALT 14 09/21/2019: BUN 19; Creatinine, Ser 1.97; Hemoglobin 11.8; Platelets 173; Potassium 4.1; Sodium 137   Wt Readings from Last 3 Encounters:  09/09/19 159 lb 6.4 oz (72.3 kg)  09/01/19 158 lb (71.7 kg)  08/25/19 153 lb 6.4 oz (69.6 kg)     Other studies  Reviewed: Additional studies/ records that were reviewed today include: AHF notes, AF notes, Dr Olin Pia notes   Assessment and Plan:  1.  Chronic systolic dysfunction euvolemic today Stable on an appropriate medical regimen Normal ICD function See Pace Art report No changes today  2.  Persistent atrial fibrillation S/p DCCV 06/2019 Maintaining SR today We reviewed the importance of medication compliance today For recurrent arrhythmias, would consider amiodarone or AVN ablation. He would not be a candidate for Tikosyn with poor compliance    Current medicines are reviewed at length with the patient today.   The patient {ACTIONS; HAS/DOES NOT HAVE:19233} concerns regarding his medicines.  The following changes were made today:  {NONE DEFAULTED:18576::"none"}  Labs/ tests ordered today include: *** No orders of the defined types were placed in this encounter.    Disposition:   Follow up with *** {gen number [id number]} {TIME; UNITS DAY/WEEK/MONTH:19136}   Signed, Chanetta Marshall, NP 10/15/2019 8:53 AM  [facility] [address] [place] [address] [phone] (office) [phone] (fax)

## 2019-11-05 ENCOUNTER — Telehealth: Payer: Self-pay

## 2019-11-05 NOTE — Telephone Encounter (Signed)
Patient's friend, Zia, calls nurse line with questions regarding biliary drain. Patient is also present in conversation. They are requesting the name of the physician who originally placed drain as well as information as to when drain can be removed.   Patient is wanting to have drain removed in [place], as he will be there for the next two weeks. I am unable to find documentation of patient following up with specialist regarding this matter.   Please advise when drain can be safely removed.   To PCP  Talbot Grumbling, RN

## 2019-11-10 ENCOUNTER — Emergency Department (HOSPITAL_COMMUNITY)
Admission: EM | Admit: 2019-11-10 | Discharge: 2019-11-10 | Disposition: A | Discharge status: Home / Self Care | Payer: Medicare HMO | Attending: Emergency Medicine | Admitting: Emergency Medicine

## 2019-11-10 ENCOUNTER — Encounter (HOSPITAL_COMMUNITY): Payer: Self-pay | Admitting: Emergency Medicine

## 2019-11-10 ENCOUNTER — Other Ambulatory Visit: Payer: Self-pay

## 2019-11-10 DIAGNOSIS — Z5321 Procedure and treatment not carried out due to patient leaving prior to being seen by health care provider: Secondary | ICD-10-CM | POA: Diagnosis not present

## 2019-11-10 DIAGNOSIS — R109 Unspecified abdominal pain: Secondary | ICD-10-CM | POA: Insufficient documentation

## 2019-11-10 LAB — COMPREHENSIVE METABOLIC PANEL
ALT: 11 U/L (ref 0–44)
AST: 16 U/L (ref 15–41)
Albumin: 3.7 g/dL (ref 3.5–5.0)
Alkaline Phosphatase: 89 U/L (ref 38–126)
Anion gap: 12 (ref 5–15)
BUN: 10 mg/dL (ref 8–23)
CO2: 22 mmol/L (ref 22–32)
Calcium: 9.3 mg/dL (ref 8.9–10.3)
Chloride: 106 mmol/L (ref 98–111)
Creatinine, Ser: 1.61 mg/dL — ABNORMAL HIGH (ref 0.61–1.24)
GFR calc Af Amer: 51 mL/min — ABNORMAL LOW (ref >60)
GFR calc non Af Amer: 44 mL/min — ABNORMAL LOW (ref >60)
Glucose, Bld: 118 mg/dL — ABNORMAL HIGH (ref 70–99)
Potassium: 3.9 mmol/L (ref 3.5–5.1)
Sodium: 140 mmol/L (ref 135–145)
Total Bilirubin: 0.5 mg/dL (ref 0.3–1.2)
Total Protein: 7.8 g/dL (ref 6.5–8.1)

## 2019-11-10 LAB — CBC
HCT: 34.5 % — ABNORMAL LOW (ref 39.0–52.0)
Hemoglobin: 10.9 g/dL — ABNORMAL LOW (ref 13.0–17.0)
MCH: 30.4 pg (ref 26.0–34.0)
MCHC: 31.6 g/dL (ref 30.0–36.0)
MCV: 96.4 fL (ref 80.0–100.0)
Platelets: 295 10*3/uL (ref 150–400)
RBC: 3.58 MIL/uL — ABNORMAL LOW (ref 4.22–5.81)
RDW: 12.7 % (ref 11.5–15.5)
WBC: 5 10*3/uL (ref 4.0–10.5)
nRBC: 0 % (ref 0.0–0.2)

## 2019-11-10 LAB — LIPASE, BLOOD: Lipase: 26 U/L (ref 11–51)

## 2019-11-10 NOTE — ED Notes (Addendum)
Pt name called for triage, no response

## 2019-11-10 NOTE — ED Triage Notes (Addendum)
Patient has had a gallbladder drain for a year and 3 days ago changing dressing and drain came out. States was out of town. Patient has an appointment to possibly have drain removed this Friday. States intermittently draining light yellow.

## 2019-11-12 NOTE — Telephone Encounter (Signed)
IR has managed his drain thus far from knowledge and he needs to contact them for clear instructions on when it can be removed.   Glenn Schnackenberg Autry-Lott, DO 11/12/2019, 7:59 AM PGY-2, [place]

## 2019-11-13 ENCOUNTER — Encounter: Payer: Self-pay | Admitting: Family Medicine

## 2019-11-13 ENCOUNTER — Telehealth: Payer: Self-pay | Admitting: *Deleted

## 2019-11-13 ENCOUNTER — Ambulatory Visit (INDEPENDENT_AMBULATORY_CARE_PROVIDER_SITE_OTHER): Payer: Medicare HMO | Admitting: Family Medicine

## 2019-11-13 ENCOUNTER — Other Ambulatory Visit: Payer: Self-pay

## 2019-11-13 VITALS — BP 156/66 | HR 72 | Wt 163.0 lb

## 2019-11-13 DIAGNOSIS — Z0000 Encounter for general adult medical examination without abnormal findings: Secondary | ICD-10-CM

## 2019-11-13 DIAGNOSIS — K811 Chronic cholecystitis: Secondary | ICD-10-CM | POA: Diagnosis not present

## 2019-11-13 DIAGNOSIS — Z23 Encounter for immunization: Secondary | ICD-10-CM

## 2019-11-13 MED ORDER — TETANUS-DIPHTH-ACELL PERTUSSIS 5-2.5-18.5 LF-MCG/0.5 IM SUSP: 0.5000 mL | Freq: Once | INTRAMUSCULAR | 0 refills | Status: AC

## 2019-11-13 NOTE — Patient Instructions (Addendum)
It was wonderful to see you today.  Please bring ALL of your medications with you to every visit.   Today we talked about:  Getting your drain removed. You need to follow up with [facility] for this. Please call [phone] to scheduled follow up to discuss drain.   I have also referred you to chronic care management to help out with other resources and navigating the healthcare system.  Please be sure to schedule follow up at the front  desk before you leave today.   Please call the clinic at [phone] if your symptoms worsen or you have any concerns. It was our pleasure to serve you.  Dr. Janus Molder

## 2019-11-13 NOTE — Progress Notes (Signed)
    SUBJECTIVE:   CHIEF COMPLAINT / HPI:   Glenn [name] is a 69 yo M who present for the below issue.   Drain removal Would like to know when he can have his biliary drain removed. He continues to go the hospital to have the drained change but has not had a discussion of when it can or if it can be removed. He is not currently having any issus with the drain currently. No other concerns. Will get Tdap and flu shot today.   PERTINENT  PMH / PSH: Chronic Hep C, Chronic cholecystitis  OBJECTIVE:   BP (!) 156/66   Pulse 72   Wt 163 lb (73.9 kg)   SpO2 97%   BMI 25.53 kg/m   General: Appears well, no acute distress. Age appropriate. Drain: Hanging at right lateral side of patient. Clean without drainage.   ASSESSMENT/PLAN:   Chronic cholecystitis Previous [facility] notes show discussion with patient that removal of drain could likely have recurrent cholecystitis and that he is not a great candidate for surgery. We discussed this today. Number for [facility] (placed the drain) given to call patient to have more in depth conversation.  -Patient to discuss with surgery; Glenn [name] referral placed; patient need help with disease managament and education   Glenn [name], [place]

## 2019-11-13 NOTE — Chronic Care Management (AMB) (Signed)
  Chronic Care Management   Outreach Note  11/13/2019 Name: Glenn [name] MRN: [id number] DOB: 1950-03-06  Glenn [name] is a 69 y.o. year old male who is a primary care patient of Autry-Lott, Naaman Plummer, DO. I reached out to Rebeca Allegra Auzenne by phone today in response to a referral sent by [name]'s PCP, Autry-Lott, Naaman Plummer, DO.     An unsuccessful telephone outreach was attempted today. The patient was referred to the case management team for assistance with care management and care coordination.   Follow Up Plan: A HIPAA compliant phone message was left for the patient providing contact information and requesting a return call. The care management team will reach out to the patient again over the next 7 days. If patient returns call to provider office, please advise to call [place] at [phone].   [place] Management

## 2019-11-17 NOTE — Assessment & Plan Note (Signed)
Previous [facility] notes show discussion with patient that removal of drain could likely have recurrent cholecystitis and that he is not a great candidate for surgery. We discussed this today. Number for [facility] (placed the drain) given to call patient to have more in depth conversation.  -Patient to discuss with surgery; [place] referral placed; patient need help with disease managament and education

## 2019-11-18 ENCOUNTER — Ambulatory Visit (INDEPENDENT_AMBULATORY_CARE_PROVIDER_SITE_OTHER): Payer: Medicare HMO | Admitting: Emergency Medicine

## 2019-11-18 DIAGNOSIS — I255 Ischemic cardiomyopathy: Secondary | ICD-10-CM | POA: Diagnosis not present

## 2019-11-19 LAB — CUP PACEART REMOTE DEVICE CHECK
Battery Remaining Longevity: 12 mo
Battery Remaining Percentage: 27 %
Brady Statistic RV Percent Paced: 1 %
Date Time Interrogation Session: 20210929001100
HighPow Impedance: 39 Ohm
Implantable Lead Implant Date: 20151113
Implantable Lead Location: 753860
Implantable Lead Model: 295
Implantable Lead Serial Number: 135220
Implantable Pulse Generator Implant Date: 20151113
Lead Channel Impedance Value: 721 Ohm
Lead Channel Pacing Threshold Amplitude: 0.7 V
Lead Channel Pacing Threshold Pulse Width: 0.5 ms
Lead Channel Setting Pacing Amplitude: 2.5 V
Lead Channel Setting Pacing Pulse Width: 0.5 ms
Lead Channel Setting Sensing Sensitivity: 0.5 mV
Pulse Gen Serial Number: 104417

## 2019-11-19 NOTE — Chronic Care Management (AMB) (Signed)
  Chronic Care Management   Outreach Note  11/19/2019 Name: ELIGA ARVIE MRN: [id number] DOB: 05/29/1950  Glenroy Crossen Lippe is a 69 y.o. year old male who is a primary care patient of Autry-Lott, Naaman Plummer, DO. I reached out to Rebeca Allegra Fjelstad by phone today in response to a referral sent by Mr. Sebert Stollings Drach's PCP, Autry-Lott, Naaman Plummer, DO.     A second unsuccessful telephone outreach was attempted today. The patient was referred to the case management team for assistance with care management and care coordination.   Follow Up Plan: A HIPAA compliant phone message was left for the patient providing contact information and requesting a return call. The care management team will reach out to the patient again over the next 7 days. If patient returns call to provider office, please advise to call [place] at [phone].  [place] Management

## 2019-11-20 NOTE — Progress Notes (Signed)
Remote ICD transmission.   

## 2019-11-26 NOTE — Chronic Care Management (AMB) (Signed)
  Chronic Care Management   Note  11/26/2019 Name: Glenn [name] MRN: [id number] DOB: 1951-01-06  Glenn [name] is a 69 y.o. year old male who is a primary care patient of Autry-Lott, Naaman Plummer, DO. I reached out to Rebeca Allegra Knippenberg by phone today in response to a referral sent by Glenn [name] PCP, Autry-Lott, Naaman Plummer, DO.      Glenn [name] was given information about Chronic Care Management services today including:  1. CCM service includes personalized support from designated clinical staff supervised by his physician, including individualized plan of care and coordination with other care providers 2. 24/7 contact phone numbers for assistance for urgent and routine care needs. 3. Service will only be billed when office clinical staff spend 20 minutes or more in a month to coordinate care. 4. Only one practitioner may furnish and bill the service in a calendar month. 5. The patient may stop CCM services at any time (effective at the end of the month) by phone call to the office staff. 6. The patient will be responsible for cost sharing (co-pay) of up to 20% of the service fee (after annual deductible is met).  Patient agreed to services and verbal consent obtained.   Follow up plan: Telephone appointment with care management team member scheduled for:12/04/2019  [place] Management

## 2019-12-01 ENCOUNTER — Emergency Department (HOSPITAL_COMMUNITY)
Admission: EM | Admit: 2019-12-01 | Discharge: 2019-12-01 | Disposition: A | Discharge status: Home / Self Care | Payer: Medicare HMO | Attending: Emergency Medicine | Admitting: Emergency Medicine

## 2019-12-01 ENCOUNTER — Encounter (HOSPITAL_COMMUNITY): Payer: Self-pay | Admitting: Emergency Medicine

## 2019-12-01 ENCOUNTER — Other Ambulatory Visit: Payer: Self-pay

## 2019-12-01 DIAGNOSIS — R1011 Right upper quadrant pain: Secondary | ICD-10-CM | POA: Diagnosis not present

## 2019-12-01 DIAGNOSIS — N183 Chronic kidney disease, stage 3 unspecified: Secondary | ICD-10-CM | POA: Insufficient documentation

## 2019-12-01 DIAGNOSIS — Z4803 Encounter for change or removal of drains: Secondary | ICD-10-CM | POA: Diagnosis not present

## 2019-12-01 DIAGNOSIS — T85528A Displacement of other gastrointestinal prosthetic devices, implants and grafts, initial encounter: Secondary | ICD-10-CM | POA: Insufficient documentation

## 2019-12-01 DIAGNOSIS — I130 Hypertensive heart and chronic kidney disease with heart failure and stage 1 through stage 4 chronic kidney disease, or unspecified chronic kidney disease: Secondary | ICD-10-CM | POA: Diagnosis not present

## 2019-12-01 DIAGNOSIS — R300 Dysuria: Secondary | ICD-10-CM | POA: Diagnosis present

## 2019-12-01 DIAGNOSIS — Z79899 Other long term (current) drug therapy: Secondary | ICD-10-CM | POA: Diagnosis not present

## 2019-12-01 DIAGNOSIS — I5043 Acute on chronic combined systolic (congestive) and diastolic (congestive) heart failure: Secondary | ICD-10-CM | POA: Diagnosis not present

## 2019-12-01 DIAGNOSIS — Z87891 Personal history of nicotine dependence: Secondary | ICD-10-CM | POA: Diagnosis not present

## 2019-12-01 LAB — HEPATIC FUNCTION PANEL
ALT: 9 U/L (ref 0–44)
AST: 16 U/L (ref 15–41)
Albumin: 3.7 g/dL (ref 3.5–5.0)
Alkaline Phosphatase: 91 U/L (ref 38–126)
Bilirubin, Direct: <0.1 mg/dL (ref 0.0–0.2)
Total Bilirubin: 0.6 mg/dL (ref 0.3–1.2)
Total Protein: 7.5 g/dL (ref 6.5–8.1)

## 2019-12-01 LAB — CBC
HCT: 32.4 % — ABNORMAL LOW (ref 39.0–52.0)
Hemoglobin: 10.1 g/dL — ABNORMAL LOW (ref 13.0–17.0)
MCH: 28.8 pg (ref 26.0–34.0)
MCHC: 31.2 g/dL (ref 30.0–36.0)
MCV: 92.3 fL (ref 80.0–100.0)
Platelets: 233 10*3/uL (ref 150–400)
RBC: 3.51 MIL/uL — ABNORMAL LOW (ref 4.22–5.81)
RDW: 14.3 % (ref 11.5–15.5)
WBC: 4.6 10*3/uL (ref 4.0–10.5)
nRBC: 0 % (ref 0.0–0.2)

## 2019-12-01 LAB — LIPASE, BLOOD: Lipase: 31 U/L (ref 11–51)

## 2019-12-01 LAB — BASIC METABOLIC PANEL
Anion gap: 10 (ref 5–15)
BUN: 12 mg/dL (ref 8–23)
CO2: 26 mmol/L (ref 22–32)
Calcium: 9.4 mg/dL (ref 8.9–10.3)
Chloride: 103 mmol/L (ref 98–111)
Creatinine, Ser: 1.7 mg/dL — ABNORMAL HIGH (ref 0.61–1.24)
GFR, Estimated: 41 mL/min — ABNORMAL LOW (ref >60)
Glucose, Bld: 106 mg/dL — ABNORMAL HIGH (ref 70–99)
Potassium: 4 mmol/L (ref 3.5–5.1)
Sodium: 139 mmol/L (ref 135–145)

## 2019-12-01 LAB — URINALYSIS, ROUTINE W REFLEX MICROSCOPIC
Bilirubin Urine: NEGATIVE
Glucose, UA: NEGATIVE mg/dL
Hgb urine dipstick: NEGATIVE
Ketones, ur: NEGATIVE mg/dL
Leukocytes,Ua: NEGATIVE
Nitrite: NEGATIVE
Protein, ur: NEGATIVE mg/dL
Specific Gravity, Urine: 1.02 (ref 1.005–1.030)
pH: 6 (ref 5.0–8.0)

## 2019-12-01 MED ORDER — HYDROCODONE-ACETAMINOPHEN 5-325 MG PO TABS
1.0000 | ORAL_TABLET | Freq: Once | ORAL | Status: AC
  Administered 2019-12-01: 1 via ORAL
  Filled 2019-12-01: qty 1

## 2019-12-01 NOTE — ED Notes (Addendum)
Pt has defibrillator, and is on eliquis and Lasix. Pt states he has intermittent pain when he urinates but it is not consistent

## 2019-12-01 NOTE — ED Provider Notes (Signed)
[facility] EMERGENCY DEPARTMENT Provider Note   CSN: [id number] Arrival date & time: 12/01/19  6073     History Chief Complaint  Patient presents with  . Drain Displacement    Glenn [name] is a 69 y.o. male.  Patient with history of atrial fibrillation on apixaban currently on amiodarone, IVC filter placement, ischemic cardiomyopathy with left ventricular ejection fraction of 15 to 20% s/p ICD on 20mg  torsemide daily, stage III CKD hypertension, hyperlipidemia, right MCA subacute hemorrhagic CVA, history of acute cholecystitis with IR drain placement 12/13/2018 --presents for evaluation after accidental removal of cholecystostomy tube.  Patient states that this occurred 3 to 4 days ago while changing the bandaging.  Since that time he has had drainage from the stoma that is green and yellow.  He reports having ongoing, but not worsening pain in the area of the stoma.  No nausea, vomiting, diarrhea.  No fevers.  He does report dysuria.  No treatments prior to arrival.  He cannot member the last time he was seen by general surgery.         Past Medical History:  Diagnosis Date  . Acute blood loss anemia   . Acute CVA (cerebrovascular accident) ([facility])   . Acute deep vein thrombosis (DVT) of both lower extremities ([name])   . Acute kidney injury ([place])   . Acute on chronic combined systolic and diastolic CHF (congestive heart failure) ([place])   . Acute renal failure superimposed on stage 3a chronic kidney disease ([place])   . Acute respiratory failure with hypoxia ([place])   . Atrial fibrillation ([place])   . Carpal tunnel syndrome of right wrist 02/28/2018  . Cerebral edema ([place]) 11/13/2018  . Cerebral infarction ([place])   . CHF (congestive heart failure) ([place])   . Cholecystitis 02/04/2019  . Chronic right hip pain   . DCM (dilated cardiomyopathy) ([place])   . Dysphagia, post-stroke   . Elevated troponin   . Entrapment of right ulnar nerve 02/28/2018  . Headache due to  intracranial disease 11/14/2018  . Hepatitis C   . History of hemorrhagic stroke with residual hemiparesis ([place]) 02/04/2019  . HTN (hypertension) 08/14/2016  . Hyperlipidemia LDL goal <70 11/13/2018  . Hypertension   . ICD (implantable cardioverter-defibrillator) in place 09/13/2016  . Impotence due to erectile dysfunction 09/30/2017  . Ischemic cardiomyopathy   . Labile blood glucose   . Left leg DVT ([place]) 02/04/2019  . Marijuana user 11/13/2018  . Paroxysmal atrial fibrillation ([name])   . Right middle cerebral artery stroke ([place]) 11/13/2018  . Solitary pulmonary nodule 06/10/2017   5 mm RUL nodule noted incidentally as part of CVA workup 08/2016. With smoking history would obtain low-dose CT scan 08/2017.   . Stroke (cerebrum) ([name]) Lg L MCA infarct w/ hemorrhagic conversion, embolic d/t AF 08/29/6267  . Stroke ([place])   . Trochanteric bursitis, right hip 11/14/2018  . Visit for monitoring Tikosyn therapy 03/26/2017    Patient Active Problem List   Diagnosis Date Noted  . Anxiety 09/02/2019  . Malnutrition of moderate degree 07/14/2019  . Palliative care by specialist   . Atrial fibrillation with RVR ([place]) 06/23/2019  . Chronic systolic heart failure ([place]) 06/21/2019  . Persistent atrial fibrillation with RVR ([place])   . Thrombus of left atrial appendage   . Left leg pain 03/27/2019  . History of hemorrhagic stroke with residual hemiparesis ([place]) 02/04/2019  . Left leg DVT ([place]) 02/04/2019  . Chronic cholecystitis 02/04/2019  . Acute deep vein  thrombosis (DVT) of both lower extremities ([place])   . Acute blood loss anemia   . Labile blood glucose   . CKD (chronic kidney disease) stage 3, GFR 30-59 ml/min ([name])   . Chronic right hip pain   . Headache due to intracranial disease 11/14/2018  . Trochanteric bursitis, right hip 11/14/2018  . Cerebral edema ([place]) 11/13/2018  . Hyperlipidemia LDL goal <70 11/13/2018  . Marijuana user 11/13/2018  . Right middle cerebral artery stroke ([place])  11/13/2018  . Acute CVA (cerebrovascular accident) ([place])   . Noncompliance   . Dysphagia, post-stroke   . Acute on chronic combined systolic and diastolic CHF (congestive heart failure) ([place])   . DCM (dilated cardiomyopathy) ([place])   . Atrial fibrillation ([place]) 11/04/2018  . Elevated troponin   . Acute respiratory failure with hypoxia ([place])   . Entrapment of right ulnar nerve 02/28/2018  . Carpal tunnel syndrome of right wrist 02/28/2018  . Impotence due to erectile dysfunction 09/30/2017  . Solitary pulmonary nodule 06/10/2017  . Neck pain 04/06/2017  . Paroxysmal atrial fibrillation ([name])   . ICD (implantable cardioverter-defibrillator) in place 09/13/2016  . Chest pain 09/13/2016  . Tobacco abuse 09/13/2016  . Upper back pain 09/13/2016  . Housing problems 09/13/2016  . Ischemic cardiomyopathy   . Cerebral infarction ([name])   . Stroke (cerebrum) ([name]) Lg L MCA infarct w/ hemorrhagic conversion, embolic d/t AF 80/99/8338  . HTN (hypertension) 08/14/2016  . Chronic Hepatitis C  08/14/2016    Past Surgical History:  Procedure Laterality Date  . CARDIAC DEFIBRILLATOR PLACEMENT  2015  . CARDIOVERSION N/A 10/10/2016   Procedure: CARDIOVERSION;  Surgeon: Dorothy Spark, MD;  Location: [facility];  Service: Cardiovascular;  Laterality: N/A;  . CARDIOVERSION N/A 03/27/2017   Procedure: CARDIOVERSION;  Surgeon: Jerline Pain, MD;  Location: [facility];  Service: Cardiovascular;  Laterality: N/A;  . CARDIOVERSION N/A 10/29/2018   Procedure: CARDIOVERSION;  Surgeon: Sanda Klein, MD;  Location: [facility]OSCOPY;  Service: Cardiovascular;  Laterality: N/A;  . CARDIOVERSION N/A 11/05/2018   Procedure: CARDIOVERSION;  Surgeon: Thayer Headings, MD;  Location: [facility] ENDOSCOPY;  Service: Cardiovascular;  Laterality: N/A;  . CARDIOVERSION N/A 07/02/2019   Procedure: CARDIOVERSION;  Surgeon: Jolaine Artist, MD;  Location: [facility];  Service: Cardiovascular;  Laterality: N/A;  . CARDIOVERSION  N/A 07/14/2019   Procedure: CARDIOVERSION;  Surgeon: Jolaine Artist, MD;  Location: [facility];  Service: Cardiovascular;  Laterality: N/A;  . EYE SURGERY Left 1990  . IABP INSERTION N/A 06/26/2019   Procedure: IABP INSERTION;  Surgeon: Jolaine Artist, MD;  Location: [facility] CV LAB;  Service: Cardiovascular;  Laterality: N/A;  . IR CATHETER TUBE CHANGE  06/24/2019  . IR EXCHANGE BILIARY DRAIN  02/11/2019  . IR EXCHANGE BILIARY DRAIN  06/09/2019  . IR EXCHANGE BILIARY DRAIN  09/23/2019  . IR IVC FILTER PLMT / S&I /IMG GUID/MOD SED  12/04/2018  . IR PERC CHOLECYSTOSTOMY  12/13/2018  . IR PERCUTANEOUS ART THROMBECTOMY/INFUSION INTRACRANIAL INC DIAG ANGIO  09/05/2016  . IR RADIOLOGIST EVAL & MGMT  10/03/2016  . RADIOLOGY WITH ANESTHESIA N/A 09/05/2016   Procedure: RADIOLOGY WITH ANESTHESIA;  Surgeon: Luanne Bras, MD;  Location: [facility];  Service: Radiology;  Laterality: N/A;  . RIGHT HEART CATH N/A 06/24/2019   Procedure: RIGHT HEART CATH;  Surgeon: Jolaine Artist, MD;  Location: [facility] CV LAB;  Service: Cardiovascular;  Laterality: N/A;  . RIGHT/LEFT HEART CATH AND CORONARY ANGIOGRAPHY N/A 11/03/2018  Procedure: RIGHT/LEFT HEART CATH AND CORONARY ANGIOGRAPHY;  Surgeon: Lorretta Harp, MD;  Location: [facility] CV LAB;  Service: Cardiovascular;  Laterality: N/A;  . TEE WITHOUT CARDIOVERSION N/A 02/06/2019   Procedure: TRANSESOPHAGEAL ECHOCARDIOGRAM (TEE);  Surgeon: Skeet Latch, MD;  Location: [facility];  Service: Cardiovascular;  Laterality: N/A;  . TEE WITHOUT CARDIOVERSION N/A 06/09/2019   Procedure: TRANSESOPHAGEAL ECHOCARDIOGRAM (TEE);  Surgeon: Buford Dresser, MD;  Location: [facility] ENDOSCOPY;  Service: Cardiovascular;  Laterality: N/A;       Family History  Problem Relation Age of Onset  . High blood pressure Mother   . High blood pressure Father   . Stroke Maternal Aunt   . Heart disease Neg Hx     Social History   Tobacco Use  . Smoking  status: Former Smoker    Packs/day: 0.50    Types: Cigarettes  . Smokeless tobacco: Never Used  . Tobacco comment: a pack last three days  Vaping Use  . Vaping Use: Never used  Substance Use Topics  . Alcohol use: Not Currently    Alcohol/week: 3.0 standard drinks    Types: 3 Cans of beer per week    Comment: pt stop drinking   . Drug use: Not Currently    Frequency: 2.0 times per week    Types: Marijuana    Comment: stop smoking     Home Medications Prior to Admission medications   Medication Sig Start Date End Date Taking? Authorizing Provider  acetaminophen (TYLENOL) 325 MG tablet Take 2 tablets (650 mg total) by mouth every 6 (six) hours as needed for mild pain (or Fever >/= 101). 07/16/19   Clegg, Amy D, NP  ALPRAZolam (XANAX) 0.25 MG tablet Take 1 tablet (0.25 mg total) by mouth 2 (two) times daily as needed for anxiety. Patient not taking: Reported on 09/01/2019 07/16/19   Darrick Grinder D, NP  amiodarone (PACERONE) 200 MG tablet Take 1 tablet (200 mg total) by mouth every 8 (eight) hours. 07/16/19   Clegg, Amy D, NP  amLODipine (NORVASC) 5 MG tablet Take 1 tablet (5 mg total) by mouth at bedtime. 09/09/19   Benay Pike, MD  apixaban (ELIQUIS) 5 MG TABS tablet Take 1 tablet (5 mg total) by mouth 2 (two) times daily. 09/24/19   [name], MD  atorvastatin (LIPITOR) 20 MG tablet TAKE 1 TABLET(20 MG) BY MOUTH DAILY AT 6 PM Patient taking differently: Take 20 mg by mouth every evening.  03/13/19   [name], MD  calcium carbonate (TUMS - DOSED IN MG ELEMENTAL CALCIUM) 500 MG chewable tablet Chew 1 tablet (200 mg of elemental calcium total) by mouth daily as needed for indigestion or heartburn. 03/12/19   [name], MD  cephALEXin (KEFLEX) 500 MG capsule Take 1 capsule (500 mg total) by mouth 4 (four) times daily. 08/14/19   Mesner, Corene Cornea, MD  digoxin (LANOXIN) 0.125 MG tablet Take 1 tablet (0.125 mg total) by mouth daily. 08/11/19   Baldwin Jamaica, PA-C  docusate sodium  (COLACE) 100 MG capsule Take 1 capsule (100 mg total) by mouth 2 (two) times daily. 07/16/19   Clegg, Amy D, NP  gabapentin (NEURONTIN) 300 MG capsule Take 2 capsules (600 mg total) by mouth 3 (three) times daily. 09/11/19   Autry-Lott, Naaman Plummer, DO  levalbuterol (XOPENEX) 0.63 MG/3ML nebulizer solution Take 3 mLs (0.63 mg total) by nebulization every 6 (six) hours as needed for wheezing or shortness of breath. 07/16/19   Clegg, Amy D, NP  metoCLOPramide (  REGLAN) 5 MG tablet Take 1 tablet (5 mg total) by mouth 3 (three) times daily. 07/16/19   Clegg, Amy D, NP  ondansetron (ZOFRAN) 4 MG tablet Take 1 tablet (4 mg total) by mouth every 6 (six) hours as needed for nausea. 07/16/19   Clegg, Amy D, NP  pantoprazole (PROTONIX) 40 MG tablet Take 1 tablet (40 mg total) by mouth daily. Patient not taking: Reported on 09/01/2019 07/16/19   Darrick Grinder D, NP  potassium chloride SA (KLOR-CON) 20 MEQ tablet TAKE 2 TABLETS BY MOUTH ONCE DAILY FOR LOW POTASSIUM LEVELS 09/28/19   [name], MD  ranolazine (RANEXA) 500 MG 12 hr tablet TAKE 1 TABLET BY MOUTH EVERY 12 HOURS FOR ATRIAL FIBRILLATION 09/28/19   [name], MD  sertraline (ZOLOFT) 25 MG tablet Take 1 tablet (25 mg total) by mouth daily. 09/01/19   Leeanne Rio, MD  torsemide (DEMADEX) 20 MG tablet Take 2 tablets (40 mg total) by mouth 2 (two) times daily. 07/16/19   Clegg, Amy D, NP  traMADol (ULTRAM) 50 MG tablet Take 1 tablet (50 mg total) by mouth every 8 (eight) hours as needed for severe pain. Patient not taking: Reported on 09/01/2019 03/12/19   [name], MD    Allergies    Benadryl [diphenhydramine]  Review of Systems   Review of Systems  Constitutional: Negative for fever.  HENT: Negative for rhinorrhea and sore throat.   Eyes: Negative for redness.  Respiratory: Negative for cough.   Cardiovascular: Negative for chest pain.  Gastrointestinal: Positive for abdominal pain. Negative for diarrhea, nausea and vomiting.    Genitourinary: Positive for dysuria. Negative for hematuria.  Musculoskeletal: Negative for myalgias.  Skin: Negative for rash.  Neurological: Negative for headaches.    Physical Exam Updated Vital Signs BP 132/75 (BP Location: Left Arm)   Pulse 68   Temp 98.4 F (36.9 C) (Oral)   Resp 15   Ht 5\' 7"  (1.702 m)   Wt 74.8 kg   SpO2 100%   BMI 25.84 kg/m   Physical Exam Vitals and nursing note reviewed.  Constitutional:      Appearance: He is well-developed.  HENT:     Head: Normocephalic and atraumatic.  Eyes:     General:        Right eye: No discharge.        Left eye: No discharge.     Conjunctiva/sclera: Conjunctivae normal.  Cardiovascular:     Rate and Rhythm: Normal rate and regular rhythm.     Heart sounds: Normal heart sounds.  Pulmonary:     Effort: Pulmonary effort is normal.     Breath sounds: Normal breath sounds.  Abdominal:     Palpations: Abdomen is soft.     Tenderness: There is no abdominal tenderness.     Comments: Patient with drain opening in the right upper abdomen.  There is small amount of greenish-yellow liquid draining from the stoma.  No purulence.  Minimal surrounding tenderness.  No erythema.  Patient does not wince with palpation in any area on the abdomen.  Musculoskeletal:     Cervical back: Normal range of motion and neck supple.  Skin:    General: Skin is warm and dry.  Neurological:     Mental Status: He is alert.     ED Results / Procedures / Treatments   Labs (all labs ordered are listed, but only abnormal results are displayed) Labs Reviewed  CBC - Abnormal; Notable for the following  components:      Result Value   RBC 3.51 (*)    Hemoglobin 10.1 (*)    HCT 32.4 (*)    All other components within normal limits  BASIC METABOLIC PANEL - Abnormal; Notable for the following components:   Glucose, Bld 106 (*)    Creatinine, Ser 1.70 (*)    GFR, Estimated 41 (*)    All other components within normal limits  URINALYSIS,  ROUTINE W REFLEX MICROSCOPIC  HEPATIC FUNCTION PANEL  LIPASE, BLOOD    EKG None  Radiology No results found.  Procedures Procedures (including critical care time)  Medications Ordered in ED Medications  HYDROcodone-acetaminophen (NORCO/VICODIN) 5-325 MG per tablet 1 tablet (1 tablet Oral Given 12/01/19 1734)    ED Course  I have reviewed the triage vital signs and the nursing notes.  Pertinent labs & imaging results that were available during my care of the patient were reviewed by me and considered in my medical decision making (see chart for details).  Patient seen and examined.  Reviewed previous medical history.  Will touch base with general surgery for recommendations.  White blood cell count is normal.  Added hepatic function panel, lipase, UA.  Vital signs reviewed and are as follows: BP 132/75 (BP Location: Left Arm)   Pulse 68   Temp 98.4 F (36.9 C) (Oral)   Resp 15   Ht 5\' 7"  (1.702 m)   Wt 74.8 kg   SpO2 100%   BMI 25.84 kg/m   3:44 PM Spoke with Barkley Boards PA-C and Saverio Danker PA-C of general surgery.  If patient does not have signs symptoms suggestive of acute cholecystitis and reassuring labs, he does not necessarily need to have this replaced --but needs to be aware that if he develops nausea vomiting, right upper quadrant pain, fever, he would need to return for consideration of replacement of drain.  7:39 PM UA resulted. Pt discussed with and seen earlier by Dr. Maryan Rued.   Patient continues to look very well.  He is excited to go home.  He does not want drain replaced per our discussion.  We talked about general surgery reccs.  Discussed he should keep drain site bandage until stoma can close.  We discussed strict return precautions such as worsening upper abdominal pain, fever, vomiting.  In his case, he could be developing a recurrent gallbladder infection and would need the drain replaced.  He is aware that this is a possibility and would need to  return if this occurs.  I have provided him the number for [facility] surgery and encouraged him to call tomorrow to schedule a follow-up appointment.    MDM Rules/Calculators/A&P                          Patient here for evaluation of cholecystostomy drain removal.  Patient has had this for about a year.  No clinical signs and symptoms of cholecystitis today.  White blood cell count, LFTs, lipase are normal.  He does not have significant abdominal pain, only some tenderness about the opening of the skin which does not appear to be infected at this time.  Discussed replacement of drain versus leaving it out at this time.  Discussed with general surgery as well.  Patient would like to have the drain out.  He will follow up with general surgery in regards to his cholecystitis.   Final Clinical Impression(s) / ED Diagnoses Final diagnoses:  Encounter for  change or removal of drains  Right upper quadrant abdominal pain    Rx / DC Orders ED Discharge Orders    None       Carlisle Cater, PA-C 12/01/19 1943    Blanchie Dessert, MD 12/02/19 1335

## 2019-12-01 NOTE — Discharge Instructions (Signed)
Please read and follow all provided instructions.  Your diagnoses today include:  1. Encounter for change or removal of drains   2. Right upper quadrant abdominal pain     Tests performed today include: Blood counts and electrolytes - normal white blood cell count Blood tests to check liver and kidney function - normal liver function testing Blood tests to check pancreas function Urine test to look for infection - no sign of infection Vital signs. See below for your results today.   Medications prescribed:  None  Take any prescribed medications only as directed.  Home care instructions:  Follow any educational materials contained in this packet.  Follow-up instructions: Please follow-up with general surgery regarding recheck of your drain site.     Return instructions:  SEEK IMMEDIATE MEDICAL ATTENTION IF: The pain does not go away or becomes severe  A temperature above 101F develops  Repeated vomiting occurs (multiple episodes)  The pain becomes localized to portions of the abdomen. The right side could possibly be appendicitis. In an adult, the left lower portion of the abdomen could be colitis or diverticulitis.  Blood is being passed in stools or vomit (bright red or black tarry stools)  You develop chest pain, difficulty breathing, dizziness or fainting, or become confused, poorly responsive, or inconsolable (young children) If you have any other emergent concerns regarding your health  Additional Information: Abdominal (belly) pain can be caused by many things. Your caregiver performed an examination and possibly ordered blood/urine tests and imaging (CT scan, x-rays, ultrasound). Many cases can be observed and treated at home after initial evaluation in the emergency department. Even though you are being discharged home, abdominal pain can be unpredictable. Therefore, you need a repeated exam if your pain does not resolve, returns, or worsens. Most patients with abdominal  pain don't have to be admitted to the hospital or have surgery, but serious problems like appendicitis and gallbladder attacks can start out as nonspecific pain. Many abdominal conditions cannot be diagnosed in one visit, so follow-up evaluations are very important.  Your vital signs today were: BP (!) 134/99 (BP Location: Right Arm)   Pulse 83   Temp 98.6 F (37 C) (Oral)   Resp 17   Ht 5\' 7"  (1.702 m)   Wt 74.8 kg   SpO2 98%   BMI 25.84 kg/m  If your blood pressure (bp) was elevated above 135/85 this visit, please have this repeated by your doctor within one month. --------------

## 2019-12-01 NOTE — ED Triage Notes (Signed)
Pt arrives to ED with complaints of his biliary drain being displaced x4 days ago while in [place]. Pt denies bleeding or pain. States hes afraid of infection to the site.

## 2019-12-04 ENCOUNTER — Telehealth: Payer: Self-pay | Admitting: Internal Medicine

## 2019-12-04 ENCOUNTER — Ambulatory Visit: Payer: Medicare HMO

## 2019-12-04 NOTE — Telephone Encounter (Signed)
Returning patients phone call.   Patient advised his battery has a little over 1 year remaining, we will call when he has 3 months left. Verbalized understanding.

## 2019-12-04 NOTE — Telephone Encounter (Signed)
New message:     Patient calling to see when battery need to be change patient has a device. Please call patient back.

## 2019-12-04 NOTE — Patient Instructions (Signed)
Visit Information  Goals Addressed              This Visit's Progress     " I want my biliary drain removed" (pt-stated)        CARE PLAN ENTRY (see longitudinal plan of care for additional care plan information)  Current Barriers:   Care Coordination needs related to removal of a biliary drain in a patient with Chronic cholecystitis.  Clinical Goal(s):   Over the next 30 days, patient will verbalize understanding of plan for to get him connected to a doctor to help for examination to get biliary duct removed.  Interventions:   Inter-disciplinary care team collaboration (see longitudinal plan of care)  Reviewed medications with patient and discussed - patient was not taking six of his medications RNCM called the pharmacy and spoke with Glenn [name] the pharmacist and she refilled his Digoxin, Renexa, Gabapentin, Zoloft, Xopenex and she is sending a request for his Torsemide.  She said that he is not compliant with his medications. RNCM will send message to pharmacy.  Collaborating with PCP to get referral to gastroenterologist for patient to evaluate the patient.  Discussed plans with patient for ongoing care management follow up and provided patient with direct contact information for care management team  Patient Self Care Activities:   Patient verbalizes understanding of plan   Attends all scheduled provider appointments  Will call pharmacy for medication refills  Will call provider office for new concerns or questions  Unable to independently navigate health system         Glenn [name] was given information about Care Management services today including:  1. Care Management services include personalized support from designated clinical staff supervised by his physician, including individualized plan of care and coordination with other care providers 2. 24/7 contact phone numbers for assistance for urgent and routine care needs. 3. The patient may stop CCM services  at any time (effective at the end of the month) by phone call to the office staff.  Patient agreed to services and verbal consent obtained.   The patient verbalized understanding of instructions provided today and declined a print copy of patient instruction materials.     Plan: Telephone follow up with Glenn [name] over the next 14 days.    Glenn Arms RN, BSN, [facility] Care Management Coordinator [place] Phone: [phone] Fax: [phone]

## 2019-12-04 NOTE — Chronic Care Management (AMB) (Signed)
Chronic Care Management   Initial Visit Note  12/04/2019 Name: Glenn [name] MRN: [id number] DOB: 25-Oct-1950   Referred by: PCP, Autry-Lott  Reason for referral : Appointment (Initial)   Glenn [name] is a 69 y.o. year old male who is a primary care patient of [place], [place], DO. The CCM team was consulted for assistance with chronic disease management and care coordination needs related to Chronic cholecystitis  RNCM reached out to Glenn [name] by to introduce myself, and do an assessment of chronic conditions.   Subjective: " I want to remove my biliary drain "   Objective: 2 ED visits   09/21/19 12/01/19 for displaced drain  Assessment: patient had drain placed in 2020 for gallstones .  He states that he does not remember who placed his drain but he is ready to have it removed. When speaking to the patient he seemed to have a slight cognitive deficit that may be related to his stroke. He would continue to repeat the same information about his drain. Patient agreed the he may need an evaluation to see if he can have the drain removed.   The CCM team has spoken with the the patient after his visit with his primary on 11/13/19 and discussed the following issue.  Goals Addressed              This Visit's Progress   .  " I want my biliary drain removed" (pt-stated)        CARE PLAN ENTRY (see longitudinal plan of care for additional care plan information)  Current Barriers:  . Care Coordination needs related to removal of a biliary drain in a patient with Chronic cholecystitis.  Clinical Goal(s):  Marland Kitchen Over the next 30 days, patient will verbalize understanding of plan for to get him connected to a doctor to help for examination to get biliary duct removed.  Interventions:  . Inter-disciplinary care team collaboration (see longitudinal plan of care) . Reviewed medications with patient and discussed - patient was not taking six of his medications RNCM called  the pharmacy and spoke with Leann the pharmacist and she refilled his Digoxin, Renexa, Gabapentin, Zoloft, Xopenex and she is sending a request for his Torsemide.  She said that he is not compliant with his medications. RNCM will send message to pharmacy. . Collaborating with PCP to get referral to gastroenterologist for patient to evaluate the patient. . Discussed plans with patient for ongoing care management follow up and provided patient with direct contact information for care management team  Patient Self Care Activities:  . Patient verbalizes understanding of plan  . Attends all scheduled provider appointments . Will call pharmacy for medication refills . Will call provider office for new concerns or questions . Unable to independently navigate health system        Review of patient status, including review of consultants reports, relevant laboratory and other test results, and collaboration with appropriate care team members and the patient's provider was performed as part of comprehensive patient evaluation and provision of chronic care management services.    SDOH (Social Determinants of Health) assessments performed: No See Care Plan activities for detailed interventions related to SDOH     Medications: Outpatient Encounter Medications as of 12/04/2019  Medication Sig Note  . acetaminophen (TYLENOL) 325 MG tablet Take 2 tablets (650 mg total) by mouth every 6 (six) hours as needed for mild pain (or Fever >/= 101).   Marland Kitchen amiodarone (PACERONE) 200 MG tablet  Take 1 tablet (200 mg total) by mouth every 8 (eight) hours.   Marland Kitchen amLODipine (NORVASC) 5 MG tablet Take 1 tablet (5 mg total) by mouth at bedtime. 12/04/2019: Has called to let them know he needs a refill  . apixaban (ELIQUIS) 5 MG TABS tablet Take 1 tablet (5 mg total) by mouth 2 (two) times daily.   Marland Kitchen atorvastatin (LIPITOR) 20 MG tablet TAKE 1 TABLET(20 MG) BY MOUTH DAILY AT 6 PM (Patient taking differently: Take 20 mg by mouth  every evening. )   . calcium carbonate (TUMS - DOSED IN MG ELEMENTAL CALCIUM) 500 MG chewable tablet Chew 1 tablet (200 mg of elemental calcium total) by mouth daily as needed for indigestion or heartburn.   . docusate sodium (COLACE) 100 MG capsule Take 1 capsule (100 mg total) by mouth 2 (two) times daily.   Marland Kitchen levalbuterol (XOPENEX) 0.63 MG/3ML nebulizer solution Take 3 mLs (0.63 mg total) by nebulization every 6 (six) hours as needed for wheezing or shortness of breath. 12/04/2019: Needs refill  . metoCLOPramide (REGLAN) 5 MG tablet Take 1 tablet (5 mg total) by mouth 3 (three) times daily.   . ondansetron (ZOFRAN) 4 MG tablet Take 1 tablet (4 mg total) by mouth every 6 (six) hours as needed for nausea.   . pantoprazole (PROTONIX) 40 MG tablet Take 1 tablet (40 mg total) by mouth daily.   . potassium chloride SA (KLOR-CON) 20 MEQ tablet TAKE 2 TABLETS BY MOUTH ONCE DAILY FOR LOW POTASSIUM LEVELS   . ranolazine (RANEXA) 500 MG 12 hr tablet TAKE 1 TABLET BY MOUTH EVERY 12 HOURS FOR ATRIAL FIBRILLATION 12/04/2019: Need refill  . sertraline (ZOLOFT) 25 MG tablet Take 1 tablet (25 mg total) by mouth daily. 12/04/2019: Need refill  . ALPRAZolam (XANAX) 0.25 MG tablet Take 1 tablet (0.25 mg total) by mouth 2 (two) times daily as needed for anxiety. (Patient not taking: Reported on 09/01/2019)   . cephALEXin (KEFLEX) 500 MG capsule Take 1 capsule (500 mg total) by mouth 4 (four) times daily. (Patient not taking: Reported on 12/04/2019)   . digoxin (LANOXIN) 0.125 MG tablet Take 1 tablet (0.125 mg total) by mouth daily. (Patient not taking: Reported on 12/04/2019) 12/04/2019: Need refill  . gabapentin (NEURONTIN) 300 MG capsule Take 2 capsules (600 mg total) by mouth 3 (three) times daily. (Patient not taking: Reported on 12/04/2019) 12/04/2019: Needs refill  . torsemide (DEMADEX) 20 MG tablet Take 2 tablets (40 mg total) by mouth 2 (two) times daily. (Patient not taking: Reported on 12/04/2019) 12/04/2019:  Need refill  . traMADol (ULTRAM) 50 MG tablet Take 1 tablet (50 mg total) by mouth every 8 (eight) hours as needed for severe pain. (Patient not taking: Reported on 09/01/2019)    No facility-administered encounter medications on file as of 12/04/2019.        Plan: Telephone follow up with Glenn [name] over the next 14 days.    [name] RN, BSN, [facility] Care Management Coordinator [place] Phone: [phone] Fax: [phone]

## 2019-12-16 ENCOUNTER — Inpatient Hospital Stay (HOSPITAL_COMMUNITY): Admission: RE | Admit: 2019-12-16 | Payer: Medicare HMO | Source: Ambulatory Visit

## 2019-12-22 ENCOUNTER — Ambulatory Visit: Payer: Medicare HMO

## 2019-12-22 NOTE — Patient Instructions (Signed)
Visit Information  Goals Addressed              This Visit's Progress     " I want my biliary drain removed" (pt-stated)        CARE PLAN ENTRY (see longitudinal plan of care for additional care plan information)  Current Barriers:   Care Coordination needs related to removal of a biliary drain in a patient with Chronic cholecystitis.  Clinical Goal(s):   Over the next 30 days, patient will verbalize understanding of plan for to get him connected to a doctor to help for examination to get biliary duct removed.  Interventions:   Inter-disciplinary care team collaboration (see longitudinal plan of care)  Reviewed medications with patient and discussed - patient was not taking six of his medications RNCM called the pharmacy and spoke with Leann the pharmacist and she refilled his Digoxin, Renexa, Gabapentin, Zoloft, Xopenex and she is sending a request for his Torsemide.  She said that he is not compliant with his medications. RNCM will send message to pharmacy.  Collaborating with PCP to get referral to gastroenterologist for patient to evaluate the patient.  Discussed plans with patient for ongoing care management follow up and provided patient with direct contact information for care management team  Spoke with the patient today and advised him that I had sent his PCP a message and in the note on 12/01/19 his Biliary drain was removed.  The patient states that it was not it was cut and he has part of the drain still inside of him.  I told him I would inform his PCP of this information.    Sent staff message to PCP for guidance.  Will call the patient and advise him to make an appointment with General Surgery  [name] [id number] [phone]. As advised on d/c summary on 10/12.  Patient Self Care Activities:   Patient verbalizes understanding of plan   Attends all scheduled provider appointments  Will call pharmacy for medication refills  Will call  provider office for new concerns or questions  Unable to independently navigate health system         Glenn [name] was given information about Care Management services today including:  1. Care Management services include personalized support from designated clinical staff supervised by his physician, including individualized plan of care and coordination with other care providers 2. 24/7 contact phone numbers for assistance for urgent and routine care needs. 3. The patient may stop CCM services at any time (effective at the end of the month) by phone call to the office staff.  Patient agreed to services and verbal consent obtained.   The patient verbalized understanding of instructions provided today and declined a print copy of patient instruction materials.   Plan: Telephone follow up with Glenn [name] over the next 7 days.   [name] RN, BSN, [facility] Care Management Coordinator [place] Phone: [phone] Fax: [phone]

## 2019-12-22 NOTE — Chronic Care Management (AMB) (Signed)
RN  Care Management   Follow Up Note   12/22/2019 Name: Glenn [name] MRN: [id number] DOB: December 24, 1950  Reason for referral : Chronic Care Management (Follow up Billiary Drain)   Glenn [name] is a 69 y.o. year old male who is a primary care patient of [place], [place], DO. The care management team was consulted for assistance with care management and care coordination needs.    Subjective: " I want my Biliary Drain removed"  Objective: 2 ED visits   09/21/19 12/01/19 for displaced drain   Assessment: Called to follow up with the patient about his biliary drain. Discussed his ED visit on 10/12 and that it states the drain was removed.  The patient states that the drain was not removed it was cut and he still has some of the drain left in him.   Goals Addressed              This Visit's Progress   .  " I want my biliary drain removed" (pt-stated)        CARE PLAN ENTRY (see longitudinal plan of care for additional care plan information)  Current Barriers:  . Care Coordination needs related to removal of a biliary drain in a patient with Chronic cholecystitis.  Clinical Goal(s):  Marland Kitchen Over the next 30 days, patient will verbalize understanding of plan for to get him connected to a doctor to help for examination to get biliary duct removed.  Interventions:  . Inter-disciplinary care team collaboration (see longitudinal plan of care) . Reviewed medications with patient and discussed - patient was not taking six of his medications RNCM called the pharmacy and spoke with Leann the pharmacist and she refilled his Digoxin, Renexa, Gabapentin, Zoloft, Xopenex and she is sending a request for his Torsemide.  She said that he is not compliant with his medications. RNCM will send message to pharmacy. . Collaborating with PCP to get referral to gastroenterologist for patient to evaluate the patient. . Discussed plans with patient for ongoing care management follow up and provided  patient with direct contact information for care management team . Spoke with the patient today and advised him that I had sent his PCP a message and in the note on 12/01/19 his Biliary drain was removed.  The patient states that it was not it was cut and he has part of the drain still inside of him.  I told him I would inform his PCP of this information.   Glenn [name] staff message to PCP for guidance.  Will call the patient and advise him to make an appointment with General Surgery  [place] [phone]. As advised on d/c summary on 10/12.  Patient Self Care Activities:  . Patient verbalizes understanding of plan  . Attends all scheduled provider appointments . Will call pharmacy for medication refills . Will call provider office for new concerns or questions . Unable to independently navigate health system         Review of patient status, including review of consultants reports, relevant laboratory and other test results, and collaboration with appropriate care team members and the patient's provider was performed as part of comprehensive patient evaluation and provision of chronic care management services.    SDOH (Social Determinants of Health) assessments performed: No See Care Plan activities for detailed interventions related to SDOH)         Plan: Telephone follow up with Glenn [name] over the next  7 days.   [name] RN, BSN, [facility] Care Management Coordinator [place] Phone: [phone] Fax: [phone]

## 2019-12-23 ENCOUNTER — Ambulatory Visit: Payer: Medicare HMO

## 2019-12-23 NOTE — Patient Instructions (Signed)
Visit Information  Goals Addressed              This Visit's Progress   .  " I want my biliary drain removed" (pt-stated)        CARE PLAN ENTRY (see longitudinal plan of care for additional care plan information)  Current Barriers:  . Care Coordination needs related to removal of a biliary drain in a patient with Chronic cholecystitis.  Clinical Goal(s):  Marland Kitchen Over the next 30 days, patient will verbalize understanding of plan for to get him connected to a doctor to help for examination to get biliary duct removed.  Interventions:  . Inter-disciplinary care team collaboration (see longitudinal plan of care) . Reviewed medications with patient and discussed - patient was not taking six of his medications RNCM called the pharmacy and spoke with Leann the pharmacist and she refilled his Digoxin, Renexa, Gabapentin, Zoloft, Xopenex and she is sending a request for his Torsemide.  She said that he is not compliant with his medications. RNCM will send message to pharmacy. . Collaborating with PCP to get referral to gastroenterologist for patient to evaluate the patient. . Discussed plans with patient for ongoing care management follow up and provided patient with direct contact information for care management team . Patient called in today for RNCM to help him schedule an appointment with [facility] Surgery.  Called 3 way and spoke with Claiborne Billings.  The patient has an appointment scheduled with Dr Zenia Resides on Monday 12/28/19 at 1: 30 PM.  The address was given while on the phone by Claiborne Billings and name of the office.  The patient stated that he has transportation to get to the appointment.  Patient Self Care Activities:  . Patient verbalizes understanding of plan  . Attends all scheduled provider appointments . Will call pharmacy for medication refills . Will call provider office for new concerns or questions . Unable to independently navigate health system         Mr. Oettinger was given  information about Care Management services today including:  1. Care Management services include personalized support from designated clinical staff supervised by his physician, including individualized plan of care and coordination with other care providers 2. 24/7 contact phone numbers for assistance for urgent and routine care needs. 3. The patient may stop CCM services at any time (effective at the end of the month) by phone call to the office staff.  Patient agreed to services and verbal consent obtained.   The patient verbalized understanding of instructions provided today and declined a print copy of patient instruction materials.   Plan: Telephone follow up with Rebeca Allegra Gryder over the next 10 days.   [name] RN, BSN, [facility] Care Management Coordinator [place] Phone: [phone] Fax: [phone]

## 2019-12-23 NOTE — Chronic Care Management (AMB) (Signed)
   RN  Care Management   Follow Up Note   12/23/2019 Name: Glenn [name] MRN: [id number] DOB: 07-24-1950  Reason for referral : Care Coordination (Appointment)   Glenn [name] is a 69 y.o. year old male who is a primary care patient of [place], Naaman [place]um[place], DO. The care management team was consulted for assistance with care management and care coordination needs.    Subjective: " I want my Biliary drain removed"   Assessment: Patient return call for help to schedule an appointment with [facility] Surgery for evaluation.   Goals Addressed              This Visit's Progress   .  " I want my biliary drain removed" (pt-stated)        CARE PLAN ENTRY (see longitudinal plan of care for additional care plan information)  Current Barriers:  . Care Coordination needs related to removal of a biliary drain in a patient with Chronic cholecystitis.  Clinical Goal(s):  Marland Kitchen Over the next 30 days, patient will verbalize understanding of plan for to get him connected to a doctor to help for examination to get biliary duct removed.  Interventions:  . Inter-disciplinary care team collaboration (see longitudinal plan of care) . Reviewed medications with patient and discussed - patient was not taking six of his medications RNCM called the pharmacy and spoke with Leann the pharmacist and she refilled his Digoxin, Renexa, Gabapentin, Zoloft, Xopenex and she is sending a request for his Torsemide.  She said that he is not compliant with his medications. RNCM will send message to pharmacy. . Collaborating with PCP to get referral to gastroenterologist for patient to evaluate the patient. . Discussed plans with patient for ongoing care management follow up and provided patient with direct contact information for care management team . Patient called in today for RNCM to help him schedule an appointment with [facility] Surgery.  Called 3 way and spoke with Claiborne Billings.  The patient has an  appointment scheduled with Dr Zenia Resides on Monday 12/28/19 at 1: 30 PM.  The address was given while on the phone by Claiborne Billings and name of the office.  The patient stated that he has transportation to get to the appointment.  Patient Self Care Activities:  . Patient verbalizes understanding of plan  . Attends all scheduled provider appointments . Will call pharmacy for medication refills . Will call provider office for new concerns or questions . Unable to independently navigate health system         Review of patient status, including review of consultants reports, relevant laboratory and other test results, and collaboration with appropriate care team members and the patient's provider was performed as part of comprehensive patient evaluation and provision of chronic care management services.    SDOH (Social Determinants of Health) assessments performed: No See Care Plan activities for detailed interventions related to SDOH)         Plan: Telephone follow up with Rebeca Allegra Chapple over the next 10 days.   [name] RN, BSN, [facility] Care Management Coordinator [place] Phone: [phone] Fax: [phone]

## 2019-12-25 ENCOUNTER — Inpatient Hospital Stay (HOSPITAL_COMMUNITY)
Admission: EM | Admit: 2019-12-25 | Discharge: 2020-01-18 | DRG: 286 | Disposition: A | Discharge status: Home Health | Payer: Medicare HMO | Attending: Internal Medicine | Admitting: Internal Medicine

## 2019-12-25 ENCOUNTER — Emergency Department (HOSPITAL_COMMUNITY): Payer: Medicare HMO

## 2019-12-25 ENCOUNTER — Other Ambulatory Visit: Payer: Self-pay

## 2019-12-25 DIAGNOSIS — J449 Chronic obstructive pulmonary disease, unspecified: Secondary | ICD-10-CM | POA: Diagnosis present

## 2019-12-25 DIAGNOSIS — R0902 Hypoxemia: Secondary | ICD-10-CM

## 2019-12-25 DIAGNOSIS — I4819 Other persistent atrial fibrillation: Secondary | ICD-10-CM | POA: Diagnosis not present

## 2019-12-25 DIAGNOSIS — R471 Dysarthria and anarthria: Secondary | ICD-10-CM | POA: Diagnosis not present

## 2019-12-25 DIAGNOSIS — R636 Underweight: Secondary | ICD-10-CM | POA: Diagnosis present

## 2019-12-25 DIAGNOSIS — I420 Dilated cardiomyopathy: Secondary | ICD-10-CM | POA: Diagnosis present

## 2019-12-25 DIAGNOSIS — I69354 Hemiplegia and hemiparesis following cerebral infarction affecting left non-dominant side: Secondary | ICD-10-CM

## 2019-12-25 DIAGNOSIS — I509 Heart failure, unspecified: Secondary | ICD-10-CM

## 2019-12-25 DIAGNOSIS — Z87891 Personal history of nicotine dependence: Secondary | ICD-10-CM

## 2019-12-25 DIAGNOSIS — N1832 Chronic kidney disease, stage 3b: Secondary | ICD-10-CM | POA: Diagnosis present

## 2019-12-25 DIAGNOSIS — B182 Chronic viral hepatitis C: Secondary | ICD-10-CM | POA: Diagnosis present

## 2019-12-25 DIAGNOSIS — K8010 Calculus of gallbladder with chronic cholecystitis without obstruction: Secondary | ICD-10-CM | POA: Diagnosis present

## 2019-12-25 DIAGNOSIS — I130 Hypertensive heart and chronic kidney disease with heart failure and stage 1 through stage 4 chronic kidney disease, or unspecified chronic kidney disease: Secondary | ICD-10-CM | POA: Diagnosis not present

## 2019-12-25 DIAGNOSIS — N179 Acute kidney failure, unspecified: Secondary | ICD-10-CM | POA: Diagnosis not present

## 2019-12-25 DIAGNOSIS — Z6825 Body mass index (BMI) 25.0-25.9, adult: Secondary | ICD-10-CM

## 2019-12-25 DIAGNOSIS — G8929 Other chronic pain: Secondary | ICD-10-CM | POA: Diagnosis present

## 2019-12-25 DIAGNOSIS — Z888 Allergy status to other drugs, medicaments and biological substances status: Secondary | ICD-10-CM

## 2019-12-25 DIAGNOSIS — Z823 Family history of stroke: Secondary | ICD-10-CM

## 2019-12-25 DIAGNOSIS — R000 Tachycardia, unspecified: Secondary | ICD-10-CM | POA: Diagnosis present

## 2019-12-25 DIAGNOSIS — R0789 Other chest pain: Secondary | ICD-10-CM | POA: Diagnosis present

## 2019-12-25 DIAGNOSIS — R29810 Facial weakness: Secondary | ICD-10-CM | POA: Diagnosis not present

## 2019-12-25 DIAGNOSIS — R251 Tremor, unspecified: Secondary | ICD-10-CM | POA: Diagnosis not present

## 2019-12-25 DIAGNOSIS — I4891 Unspecified atrial fibrillation: Secondary | ICD-10-CM | POA: Diagnosis present

## 2019-12-25 DIAGNOSIS — Z452 Encounter for adjustment and management of vascular access device: Secondary | ICD-10-CM

## 2019-12-25 DIAGNOSIS — I5043 Acute on chronic combined systolic (congestive) and diastolic (congestive) heart failure: Secondary | ICD-10-CM | POA: Diagnosis present

## 2019-12-25 DIAGNOSIS — R7989 Other specified abnormal findings of blood chemistry: Secondary | ICD-10-CM | POA: Diagnosis present

## 2019-12-25 DIAGNOSIS — R1310 Dysphagia, unspecified: Secondary | ICD-10-CM | POA: Diagnosis present

## 2019-12-25 DIAGNOSIS — Z7189 Other specified counseling: Secondary | ICD-10-CM

## 2019-12-25 DIAGNOSIS — R278 Other lack of coordination: Secondary | ICD-10-CM | POA: Diagnosis not present

## 2019-12-25 DIAGNOSIS — F32A Depression, unspecified: Secondary | ICD-10-CM | POA: Diagnosis present

## 2019-12-25 DIAGNOSIS — Z7901 Long term (current) use of anticoagulants: Secondary | ICD-10-CM

## 2019-12-25 DIAGNOSIS — Z95828 Presence of other vascular implants and grafts: Secondary | ICD-10-CM

## 2019-12-25 DIAGNOSIS — I69391 Dysphagia following cerebral infarction: Secondary | ICD-10-CM

## 2019-12-25 DIAGNOSIS — R911 Solitary pulmonary nodule: Secondary | ICD-10-CM | POA: Diagnosis present

## 2019-12-25 DIAGNOSIS — I5082 Biventricular heart failure: Secondary | ICD-10-CM | POA: Diagnosis present

## 2019-12-25 DIAGNOSIS — R570 Cardiogenic shock: Secondary | ICD-10-CM | POA: Diagnosis not present

## 2019-12-25 DIAGNOSIS — Z79891 Long term (current) use of opiate analgesic: Secondary | ICD-10-CM

## 2019-12-25 DIAGNOSIS — I5023 Acute on chronic systolic (congestive) heart failure: Secondary | ICD-10-CM | POA: Diagnosis present

## 2019-12-25 DIAGNOSIS — D509 Iron deficiency anemia, unspecified: Secondary | ICD-10-CM | POA: Diagnosis present

## 2019-12-25 DIAGNOSIS — Z86718 Personal history of other venous thrombosis and embolism: Secondary | ICD-10-CM

## 2019-12-25 DIAGNOSIS — Z95810 Presence of automatic (implantable) cardiac defibrillator: Secondary | ICD-10-CM

## 2019-12-25 DIAGNOSIS — K219 Gastro-esophageal reflux disease without esophagitis: Secondary | ICD-10-CM | POA: Diagnosis present

## 2019-12-25 DIAGNOSIS — Z20822 Contact with and (suspected) exposure to covid-19: Secondary | ICD-10-CM | POA: Diagnosis present

## 2019-12-25 DIAGNOSIS — E785 Hyperlipidemia, unspecified: Secondary | ICD-10-CM | POA: Diagnosis present

## 2019-12-25 DIAGNOSIS — F419 Anxiety disorder, unspecified: Secondary | ICD-10-CM | POA: Diagnosis present

## 2019-12-25 DIAGNOSIS — Z9114 Patient's other noncompliance with medication regimen: Secondary | ICD-10-CM

## 2019-12-25 DIAGNOSIS — Z79899 Other long term (current) drug therapy: Secondary | ICD-10-CM

## 2019-12-25 LAB — BASIC METABOLIC PANEL
Anion gap: 12 (ref 5–15)
BUN: 11 mg/dL (ref 8–23)
CO2: 22 mmol/L (ref 22–32)
Calcium: 8.8 mg/dL — ABNORMAL LOW (ref 8.9–10.3)
Chloride: 105 mmol/L (ref 98–111)
Creatinine, Ser: 1.69 mg/dL — ABNORMAL HIGH (ref 0.61–1.24)
GFR, Estimated: 44 mL/min — ABNORMAL LOW (ref >60)
Glucose, Bld: 105 mg/dL — ABNORMAL HIGH (ref 70–99)
Potassium: 3.5 mmol/L (ref 3.5–5.1)
Sodium: 139 mmol/L (ref 135–145)

## 2019-12-25 LAB — CBC WITH DIFFERENTIAL/PLATELET
Abs Immature Granulocytes: 0.02 10*3/uL (ref 0.00–0.07)
Basophils Absolute: 0 10*3/uL (ref 0.0–0.1)
Basophils Relative: 0 %
Eosinophils Absolute: 0.2 10*3/uL (ref 0.0–0.5)
Eosinophils Relative: 4 %
HCT: 30 % — ABNORMAL LOW (ref 39.0–52.0)
Hemoglobin: 9.2 g/dL — ABNORMAL LOW (ref 13.0–17.0)
Immature Granulocytes: 0 %
Lymphocytes Relative: 20 %
Lymphs Abs: 1.1 10*3/uL (ref 0.7–4.0)
MCH: 28.3 pg (ref 26.0–34.0)
MCHC: 30.7 g/dL (ref 30.0–36.0)
MCV: 92.3 fL (ref 80.0–100.0)
Monocytes Absolute: 0.5 10*3/uL (ref 0.1–1.0)
Monocytes Relative: 9 %
Neutro Abs: 3.5 10*3/uL (ref 1.7–7.7)
Neutrophils Relative %: 67 %
Platelets: 195 10*3/uL (ref 150–400)
RBC: 3.25 MIL/uL — ABNORMAL LOW (ref 4.22–5.81)
RDW: 15.3 % (ref 11.5–15.5)
WBC: 5.3 10*3/uL (ref 4.0–10.5)
nRBC: 0 % (ref 0.0–0.2)

## 2019-12-25 LAB — TROPONIN I (HIGH SENSITIVITY)
Troponin I (High Sensitivity): 497 ng/L (ref <18)
Troponin I (High Sensitivity): 440 ng/L (ref <18)

## 2019-12-25 LAB — BRAIN NATRIURETIC PEPTIDE: B Natriuretic Peptide: 776.7 pg/mL — ABNORMAL HIGH (ref 0.0–100.0)

## 2019-12-25 LAB — RESPIRATORY PANEL BY RT PCR (FLU A&B, COVID)
Influenza A by PCR: NEGATIVE
Influenza B by PCR: NEGATIVE
SARS Coronavirus 2 by RT PCR: NEGATIVE

## 2019-12-25 MED ORDER — FUROSEMIDE 10 MG/ML IJ SOLN: 80.0000 mg | Freq: Once | INTRAMUSCULAR | Status: DC

## 2019-12-25 MED ORDER — RANOLAZINE ER 500 MG PO TB12
500.0000 mg | ORAL_TABLET | Freq: Two times a day (BID) | ORAL | Status: DC
  Administered 2019-12-26 – 2019-12-29 (×7): 500 mg via ORAL
  Filled 2019-12-25 (×9): qty 1

## 2019-12-25 MED ORDER — HYDROXYZINE HCL 25 MG PO TABS
25.0000 mg | ORAL_TABLET | Freq: Every day | ORAL | Status: DC
  Administered 2019-12-26 – 2019-12-30 (×6): 25 mg via ORAL
  Filled 2019-12-25 (×6): qty 1

## 2019-12-25 MED ORDER — METOCLOPRAMIDE HCL 10 MG PO TABS: 5.0000 mg | ORAL_TABLET | Freq: Three times a day (TID) | ORAL | Status: DC

## 2019-12-25 MED ORDER — FUROSEMIDE 10 MG/ML IJ SOLN
80.0000 mg | Freq: Two times a day (BID) | INTRAMUSCULAR | Status: DC
  Administered 2019-12-26 – 2019-12-28 (×6): 80 mg via INTRAVENOUS
  Filled 2019-12-25 (×6): qty 8

## 2019-12-25 MED ORDER — AMIODARONE LOAD VIA INFUSION
150.0000 mg | Freq: Once | INTRAVENOUS | Status: AC
  Administered 2019-12-25: 150 mg via INTRAVENOUS
  Filled 2019-12-25 (×3): qty 83.34

## 2019-12-25 MED ORDER — CALCIUM CARBONATE ANTACID 500 MG PO CHEW: 1.0000 | CHEWABLE_TABLET | Freq: Every day | ORAL | Status: DC | PRN

## 2019-12-25 MED ORDER — POTASSIUM CHLORIDE CRYS ER 20 MEQ PO TBCR
40.0000 meq | EXTENDED_RELEASE_TABLET | Freq: Every day | ORAL | Status: DC
  Administered 2019-12-27: 40 meq via ORAL
  Filled 2019-12-25 (×2): qty 2

## 2019-12-25 MED ORDER — AMLODIPINE BESYLATE 5 MG PO TABS
5.0000 mg | ORAL_TABLET | Freq: Every day | ORAL | Status: DC
  Administered 2019-12-26 (×2): 5 mg via ORAL
  Filled 2019-12-25 (×2): qty 1

## 2019-12-25 MED ORDER — AMIODARONE HCL IN DEXTROSE 360-4.14 MG/200ML-% IV SOLN
60.0000 mg/h | INTRAVENOUS | Status: AC
  Administered 2019-12-25: 60 mg/h via INTRAVENOUS
  Filled 2019-12-25 (×2): qty 200

## 2019-12-25 MED ORDER — ACETAMINOPHEN 325 MG PO TABS
650.0000 mg | ORAL_TABLET | ORAL | Status: DC | PRN
  Administered 2020-01-02 – 2020-01-12 (×3): 650 mg via ORAL
  Filled 2019-12-25 (×3): qty 2

## 2019-12-25 MED ORDER — GABAPENTIN 300 MG PO CAPS
600.0000 mg | ORAL_CAPSULE | Freq: Three times a day (TID) | ORAL | Status: DC
  Administered 2019-12-26 – 2019-12-27 (×5): 600 mg via ORAL
  Filled 2019-12-25 (×5): qty 2

## 2019-12-25 MED ORDER — DIGOXIN 125 MCG PO TABS
0.0625 mg | ORAL_TABLET | Freq: Every day | ORAL | Status: DC
  Administered 2019-12-26 – 2019-12-27 (×3): 0.0625 mg via ORAL
  Filled 2019-12-25 (×3): qty 1

## 2019-12-25 MED ORDER — PANTOPRAZOLE SODIUM 40 MG PO TBEC
40.0000 mg | DELAYED_RELEASE_TABLET | Freq: Every day | ORAL | Status: DC
  Administered 2019-12-26 – 2020-01-18 (×25): 40 mg via ORAL
  Filled 2019-12-25 (×25): qty 1

## 2019-12-25 MED ORDER — DILTIAZEM HCL-DEXTROSE 125-5 MG/125ML-% IV SOLN (PREMIX)
5.0000 mg/h | INTRAVENOUS | Status: DC
  Administered 2019-12-25: 5 mg/h via INTRAVENOUS
  Filled 2019-12-25: qty 125

## 2019-12-25 MED ORDER — ONDANSETRON HCL 4 MG/2ML IJ SOLN
4.0000 mg | Freq: Four times a day (QID) | INTRAMUSCULAR | Status: DC | PRN
  Administered 2019-12-27 – 2020-01-02 (×3): 4 mg via INTRAVENOUS
  Filled 2019-12-25 (×3): qty 2

## 2019-12-25 MED ORDER — DILTIAZEM LOAD VIA INFUSION
10.0000 mg | Freq: Once | INTRAVENOUS | Status: AC
  Administered 2019-12-25: 10 mg via INTRAVENOUS
  Filled 2019-12-25: qty 10

## 2019-12-25 MED ORDER — APIXABAN 5 MG PO TABS
5.0000 mg | ORAL_TABLET | Freq: Two times a day (BID) | ORAL | Status: DC
  Administered 2019-12-26 – 2019-12-29 (×8): 5 mg via ORAL
  Filled 2019-12-25 (×8): qty 1

## 2019-12-25 MED ORDER — SERTRALINE HCL 50 MG PO TABS
25.0000 mg | ORAL_TABLET | Freq: Every day | ORAL | Status: DC
  Administered 2019-12-26 – 2019-12-30 (×5): 25 mg via ORAL
  Filled 2019-12-25 (×6): qty 1

## 2019-12-25 MED ORDER — ATORVASTATIN CALCIUM 10 MG PO TABS
20.0000 mg | ORAL_TABLET | Freq: Every evening | ORAL | Status: DC
  Administered 2019-12-26: 20 mg via ORAL
  Filled 2019-12-25: qty 2

## 2019-12-25 MED ORDER — POTASSIUM CHLORIDE CRYS ER 20 MEQ PO TBCR
40.0000 meq | EXTENDED_RELEASE_TABLET | Freq: Once | ORAL | Status: AC
  Administered 2019-12-25: 40 meq via ORAL
  Filled 2019-12-25: qty 2

## 2019-12-25 MED ORDER — ONDANSETRON HCL 4 MG PO TABS: 4.0000 mg | ORAL_TABLET | Freq: Four times a day (QID) | ORAL | Status: DC | PRN

## 2019-12-25 MED ORDER — FUROSEMIDE 10 MG/ML IJ SOLN
40.0000 mg | Freq: Once | INTRAMUSCULAR | Status: DC
  Administered 2019-12-25 (×2): 40 mg via INTRAVENOUS
  Filled 2019-12-25: qty 4

## 2019-12-25 MED ORDER — AMIODARONE HCL IN DEXTROSE 360-4.14 MG/200ML-% IV SOLN
30.0000 mg/h | INTRAVENOUS | Status: DC
  Administered 2019-12-26 (×2): 30 mg/h via INTRAVENOUS
  Administered 2019-12-27: 60 mg/h via INTRAVENOUS
  Administered 2019-12-27: 30 mg/h via INTRAVENOUS
  Administered 2019-12-28 – 2019-12-31 (×11): 60 mg/h via INTRAVENOUS
  Administered 2020-01-01 – 2020-01-11 (×18): 30 mg/h via INTRAVENOUS
  Filled 2019-12-25 (×42): qty 200

## 2019-12-25 NOTE — Consult Note (Addendum)
Cardiology Consultation:   Patient ID: Glenn [name]; [id number]; Aug 20, 1950   Admit date: 12/25/2019 Date of Consult: 12/25/2019  Primary Care Provider: Gerlene Fee, DO Primary Cardiologist: Virl Axe, MD  Primary Electrophysiologist:  None   Patient Profile:   Glenn [name] is a 69 y.o. male with a hx of chronic systolic HF 2/2 NICM s/p BSci ICD, atrial fibrillation, h/o stroke (Lg L MCA infarct w/ hemorrhagic conversionin 2018), h/o DVT, chornic a/c therapy, HTN, HLD, pulmonary nodule, Hepatitis C, carpal tunnel, tobacco abuse (smokes ~1/4 ppd) and chronic cholecystitis with a chronic perc drain, who is being seen today for the evaluation of Afib RVR and CHF at the request of Dr Regenia Skeeter.  Hospitalized 05/04/-07/16/2019 with CGS requiring milrinone and IABP, was in Afib at that time, s/p TEE/DCCV.   No office notes seen in Epic since then.   History of Present Illness:   Glenn [name] was in his usual state of health until a couple of days ago.  He ate some canned turnip greens, was aware they had a lot of salt in them.   After that, he felt he was putting on fluid and his heart went out of rhythm. He felt it going fast and irregular.   His respiratory status deteriorated, with increased DOE, +orthopnea and PND.   + LE edema as well. Finally, today he came to the ER, where he is being given IV Lasix. He has scales, but has not been weighing himself.  Of note, he has missed some doses of his Eliquis. He says that he does not miss doses of the torsemide, taking 60 mg qd.  No bleeding issues. No problems w/ urination.   Past Medical History:  Diagnosis Date  . Acute blood loss anemia   . Acute CVA (cerebrovascular accident) ([place])   . Acute deep vein thrombosis (DVT) of both lower extremities ([name])   . Acute kidney injury ([place])   . Acute on chronic combined systolic and diastolic CHF (congestive heart failure) ([place])   . Acute renal failure superimposed  on stage 3a chronic kidney disease ([place])   . Acute respiratory failure with hypoxia ([place])   . Atrial fibrillation ([place])   . Carpal tunnel syndrome of right wrist 02/28/2018  . Cerebral edema ([place]) 11/13/2018  . Cerebral infarction ([place])   . CHF (congestive heart failure) ([place])   . Cholecystitis 02/04/2019  . Chronic right hip pain   . DCM (dilated cardiomyopathy) ([place])   . Dysphagia, post-stroke   . Elevated troponin   . Entrapment of right ulnar nerve 02/28/2018  . Headache due to intracranial disease 11/14/2018  . Hepatitis C   . History of hemorrhagic stroke with residual hemiparesis ([place]) 02/04/2019  . HTN (hypertension) 08/14/2016  . Hyperlipidemia LDL goal <70 11/13/2018  . Hypertension   . ICD (implantable cardioverter-defibrillator) in place 09/13/2016  . Impotence due to erectile dysfunction 09/30/2017  . Ischemic cardiomyopathy   . Labile blood glucose   . Left leg DVT ([place]) 02/04/2019  . Marijuana user 11/13/2018  . Paroxysmal atrial fibrillation ([name])   . Right middle cerebral artery stroke ([place]) 11/13/2018  . Solitary pulmonary nodule 06/10/2017   5 mm RUL nodule noted incidentally as part of CVA workup 08/2016. With smoking history would obtain low-dose CT scan 08/2017.   . Stroke (cerebrum) ([name]) Lg L MCA infarct w/ hemorrhagic conversion, embolic d/t AF 6/78/9381  . Stroke ([place])   . Trochanteric bursitis, right hip 11/14/2018  . Visit for monitoring Tikosyn  therapy 03/26/2017    Past Surgical History:  Procedure Laterality Date  . CARDIAC DEFIBRILLATOR PLACEMENT  2015  . CARDIOVERSION N/A 10/10/2016   Procedure: CARDIOVERSION;  Surgeon: Dorothy Spark, MD;  Location: [facility];  Service: Cardiovascular;  Laterality: N/A;  . CARDIOVERSION N/A 03/27/2017   Procedure: CARDIOVERSION;  Surgeon: Jerline Pain, MD;  Location: [facility];  Service: Cardiovascular;  Laterality: N/A;  . CARDIOVERSION N/A 10/29/2018   Procedure: CARDIOVERSION;  Surgeon: Sanda Klein, MD;   Location: [facility] ENDOSCOPY;  Service: Cardiovascular;  Laterality: N/A;  . CARDIOVERSION N/A 11/05/2018   Procedure: CARDIOVERSION;  Surgeon: Thayer Headings, MD;  Location[facility] END[facility]COPY;  Service: Cardiovascular;  Laterality: N/A;  . CARDIOVERSION N/A 07/02/2019   Procedure: CARDIOVERSION;  Surgeon: Jolaine Artist, MD;  Location: [place];  Service: Cardiovascular;  Laterality: N/A;  . CARDIOVERSION N/A 07/14/2019   Procedure: CARDIOVERSION;  Surgeon: Jolaine Artist, MD;  Location: [facility];  Service: Cardiovascular;  Laterality: N/A;  . EYE SURGERY Left 1990  . IABP INSERTION N/A 06/26/2019   Procedure: IABP INSERTION;  Surgeon: Jolaine Artist, MD;  Location: [place] CV LAB;  Service: Cardiovascular;  Laterality: N/A;  . IR CATHETER TUBE CHANGE  06/24/2019  . IR EXCHANGE BILIARY DRAIN  02/11/2019  . IR EXCHANGE BILIARY DRAIN  06/09/2019  . IR EXCHANGE BILIARY DRAIN  09/23/2019  . IR IVC FILTER PLMT / S&I /IMG GUID/MOD SED  12/04/2018  . IR PERC CHOLECYSTOSTOMY  12/13/2018  . IR PERCUTANEOUS ART THROMBECTOMY/INFUSION INTRACRANIAL INC DIAG ANGIO  09/05/2016  . IR RADIOLOGIST EVAL & MGMT  10/03/2016  . RADIOLOGY WITH ANESTHESIA N/A 09/05/2016   Procedure: RADIOLOGY WITH ANESTHESIA;  Surgeon: Luanne Bras, MD;  Location: [place];  Service: Radiology;  Laterality: N/A;  . RIGHT HEART CATH N/A 06/24/2019   Procedure: RIGHT HEART CATH;  Surgeon: Jolaine Artist, MD;  Location: [place] CV LAB;  Service: Cardiovascular;  Laterality: N/A;  . RIGHT/LEFT HEART CATH AND CORONARY ANGIOGRAPHY N/A 11/03/2018   Procedure: RIGHT/LEFT HEART CATH AND CORONARY ANGIOGRAPHY;  Surgeon: Lorretta Harp, MD;  Location: [place] CV LAB;  Service: Cardiovascular;  Laterality: N/A;  . TEE WITHOUT CARDIOVERSION N/A 02/06/2019   Procedure: TRANSESOPHAGEAL ECHOCARDIOGRAM (TEE);  Surgeon: Skeet Latch, MD;  Location: [place];  Service: Cardiovascular;  Laterality: N/A;  . TEE WITHOUT  CARDIOVERSION N/A 06/09/2019   Procedure: TRANSESOPHAGEAL ECHOCARDIOGRAM (TEE);  Surgeon: Buford Dresser, MD;  Location: [facility] ENDOSCOPY;  Service: Cardiovascular;  Laterality: N/A;     Prior to Admission medications   Medication Sig Start Date End Date Taking? Authorizing Provider  acetaminophen (TYLENOL) 325 MG tablet Take 2 tablets (650 mg total) by mouth every 6 (six) hours as needed for mild pain (or Fever >/= 101). 07/16/19   Clegg, Amy D, NP  ALPRAZolam (XANAX) 0.25 MG tablet Take 1 tablet (0.25 mg total) by mouth 2 (two) times daily as needed for anxiety. 07/16/19   Clegg, Amy D, NP  amiodarone (PACERONE) 200 MG tablet Take 1 tablet (200 mg total) by mouth every 8 (eight) hours. 07/16/19   Clegg, Amy D, NP  amLODipine (NORVASC) 5 MG tablet Take 1 tablet (5 mg total) by mouth at bedtime. 09/09/19   Benay Pike, MD  apixaban (ELIQUIS) 5 MG TABS tablet Take 1 tablet (5 mg total) by mouth 2 (two) times daily. 09/24/19   [name], MD  atorvastatin (LIPITOR) 20 MG tablet TAKE 1 TABLET(20 MG) BY MOUTH DAILY AT  6 PM Patient taking differently: Take 20 mg by mouth every evening.  03/13/19   [name], MD  calcium carbonate (TUMS - DOSED IN MG ELEMENTAL CALCIUM) 500 MG chewable tablet Chew 1 tablet (200 mg of elemental calcium total) by mouth daily as needed for indigestion or heartburn. 03/12/19   [name], MD  cephALEXin (KEFLEX) 500 MG capsule Take 1 capsule (500 mg total) by mouth 4 (four) times daily. 08/14/19   Mesner, Corene Cornea, MD  digoxin (LANOXIN) 0.125 MG tablet Take 1 tablet (0.125 mg total) by mouth daily. 08/11/19   Baldwin Jamaica, PA-C  docusate sodium (COLACE) 100 MG capsule Take 1 capsule (100 mg total) by mouth 2 (two) times daily. 07/16/19   Clegg, Amy D, NP  gabapentin (NEURONTIN) 300 MG capsule Take 2 capsules (600 mg total) by mouth 3 (three) times daily. 09/11/19   Autry-Lott, Naaman Plummer, DO  levalbuterol (XOPENEX) 0.63 MG/3ML nebulizer solution Take 3 mLs (0.63 mg  total) by nebulization every 6 (six) hours as needed for wheezing or shortness of breath. 07/16/19   Clegg, Amy D, NP  metoCLOPramide (REGLAN) 5 MG tablet Take 1 tablet (5 mg total) by mouth 3 (three) times daily. 07/16/19   Clegg, Amy D, NP  ondansetron (ZOFRAN) 4 MG tablet Take 1 tablet (4 mg total) by mouth every 6 (six) hours as needed for nausea. 07/16/19   Clegg, Amy D, NP  pantoprazole (PROTONIX) 40 MG tablet Take 1 tablet (40 mg total) by mouth daily. 07/16/19   Clegg, Amy D, NP  potassium chloride SA (KLOR-CON) 20 MEQ tablet TAKE 2 TABLETS BY MOUTH ONCE DAILY FOR LOW POTASSIUM LEVELS 09/28/19   [name], MD  ranolazine (RANEXA) 500 MG 12 hr tablet TAKE 1 TABLET BY MOUTH EVERY 12 HOURS FOR ATRIAL FIBRILLATION 09/28/19   [name], MD  sertraline (ZOLOFT) 25 MG tablet Take 1 tablet (25 mg total) by mouth daily. 09/01/19   Leeanne Rio, MD  torsemide (DEMADEX) 20 MG tablet Take 2 tablets (40 mg total) by mouth 2 (two) times daily. 07/16/19   Clegg, Amy D, NP  traMADol (ULTRAM) 50 MG tablet Take 1 tablet (50 mg total) by mouth every 8 (eight) hours as needed for severe pain. 03/12/19   [name], MD    Inpatient Medications: Scheduled Meds: . furosemide  40 mg Intravenous Once  . potassium chloride  40 mEq Oral Once   Continuous Infusions: . diltiazem (CARDIZEM) infusion 5 mg/hr (12/25/19 1804)   PRN Meds:   Allergies:    Allergies  Allergen Reactions  . Benadryl [Diphenhydramine] Palpitations    Social History:   Social History   Socioeconomic History  . Marital status: Single    Spouse name: Not on file  . Number of children: Not on file  . Years of education: 51 (some college)  . Highest education level: Not on file  Occupational History  . Occupation: disability  Tobacco Use  . Smoking status: Former Smoker    Packs/day: 0.50    Types: Cigarettes  . Smokeless tobacco: Never Used  . Tobacco comment: a pack last three days  Vaping Use  . Vaping  Use: Never used  Substance and Sexual Activity  . Alcohol use: Not Currently    Alcohol/week: 3.0 standard drinks    Types: 3 Cans of beer per week    Comment: pt stop drinking   . Drug use: Not Currently    Frequency: 2.0 times per week  Types: Marijuana    Comment: stop smoking   . Sexual activity: Yes    Partners: Female    Birth control/protection: Condom  Other Topics Concern  . Not on file  Social History Narrative  . Not on file   Social Determinants of Health   Financial Resource Strain: Low Risk   . Difficulty of Paying Living Expenses: Not very hard  Food Insecurity: No Food Insecurity  . Worried About Charity fundraiser in the Last Year: Never true  . Ran Out of Food in the Last Year: Never true  Transportation Needs: No Transportation Needs  . Lack of Transportation (Medical): No  . Lack of Transportation (Non-Medical): No  Physical Activity:   . Days of Exercise per Week: Not on file  . Minutes of Exercise per Session: Not on file  Stress:   . Feeling of Stress : Not on file  Social Connections:   . Frequency of Communication with Friends and Family: Not on file  . Frequency of Social Gatherings with Friends and Family: Not on file  . Attends Religious Services: Not on file  . Active Member of Clubs or Organizations: Not on file  . Attends Archivist Meetings: Not on file  . Marital Status: Not on file  Intimate Partner Violence:   . Fear of Current or Ex-Partner: Not on file  . Emotionally Abused: Not on file  . Physically Abused: Not on file  . Sexually Abused: Not on file    Family History:   Family History  Problem Relation Age of Onset  . High blood pressure Mother   . High blood pressure Father   . Stroke Maternal Aunt   . Heart disease Neg Hx    Family Status:  Family Status  Relation Name Status  . Mother  Deceased at age 63       internal bleeding  . Father  Deceased  . Mat Aunt  Deceased  . MGM  Deceased  . MGF   Deceased  . PGM  Deceased  . PGF  Deceased  . Neg Hx  (Not Specified)    ROS:  Please see the history of present illness.  All other ROS reviewed and negative.     Physical Exam/Data:   Vitals:   12/25/19 1800 12/25/19 1815 12/25/19 1830 12/25/19 1900  BP: 112/81 133/84 115/69 (!) 123/95  Pulse:   98 91  Resp: (!) 28 (!) 27 (!) 29 (!) 23  Temp:      TempSrc:      SpO2:   94% 99%   No intake or output data in the 24 hours ending 12/25/19 1944  Last 3 Weights 12/01/2019 11/13/2019 11/10/2019  Weight (lbs) 165 lb 163 lb 165 lb  Weight (kg) 74.844 kg 73.936 kg 74.844 kg     There is no height or weight on file to calculate BMI.   General:  Well nourished, well developed, male in no acute distress HEENT: normal Lymph: no adenopathy Neck: JVD - 10 CM Endocrine:  No thryomegaly Vascular: No carotid bruits; 4/4 extremity pulses 2+  Cardiac:  normal S1, S2; Rapid and irreg R&R; 2/6 murmur Lungs:  Rales base bilaterally, no wheezing, rhonchi   Abd: soft, nontender, no hepatomegaly  Ext: 1+ pedal edema Musculoskeletal:  No deformities, BUE and BLE strength normal and equal Skin: warm and dry  Neuro:  CNs 2-12 intact, no focal abnormalities noted Psych:  Normal affect   EKG:  The EKG was  personally reviewed and demonstrates:  Atrial fib, RVR, HR 127 Telemetry:  Telemetry was personally reviewed and demonstrates:  Atrial fib, RVR   CV studies:   ECHO: 06/30/2019 1. Left ventricular ejection fraction, by estimation, is 20 to 25%. The  left ventricle has severely decreased function. The left ventricle  demonstrates global hypokinesis. The left ventricular internal cavity size  was moderately dilated.  2. Right ventricular systolic function is moderately reduced. The right  ventricular size is moderately enlarged.  3. Left atrial size was severely dilated. No left atrial/left atrial  appendage thrombus was detected.  4. Right atrial size was moderately dilated.  5. The  mitral valve is normal in structure. Trivial mitral valve  regurgitation.  6. Tricuspid valve regurgitation is severe.  7. The aortic valve is normal in structure. Aortic valve regurgitation is  moderate.   CATH: R heart cath 06/24/2019 RA = 19 RV = 49/16 PA = 54/17 (34) PCW = 27 Fick cardiac output/index = 3.8/2.0 Thermo CO/CI = 4.7/2.5 PVR = 1.8 (Fick) FA sat = 99% PA sat = 54%, 60%  Assessment:  1. Low output HF biventricular failure and marked volume overload  Plan/Discussion:  Start milrinone. Diuresis. Move to ICU for hemodynamic monitoring. Can upgrade to mechanical support as needed.   R/L CARDIAC CATH: 11/03/2018 IMPRESSION: Mr. Durbin has clean coronary arteries, elevated filling pressures consistent with a nonischemic cardiomyopathy with acute on chronic systolic heart failure.  He is in A. fib with RVR.  He will need pharmacologic optimization and initiation of diuretic therapy for LVEDP and elevated filling pressures.  He already has an ICD in place.  The sheath was removed and a TR band was placed on the right wrist to achieve patent hemostasis.  The Swan-Ganz catheter was removed as well as the antecubital sheath.  The patient left lab in stable condition.  Dr. Fransico Him was notified of these results.    Laboratory Data:   Chemistry Recent Labs  Lab 12/25/19 1757  NA 139  K 3.5  CL 105  CO2 22  GLUCOSE 105*  BUN 11  CREATININE 1.69*  CALCIUM 8.8*  GFRNONAA 44*  ANIONGAP 12    Lab Results  Component Value Date   ALT 9 12/01/2019   AST 16 12/01/2019   GGT 162 (H) 05/21/2017   ALKPHOS 91 12/01/2019   BILITOT 0.6 12/01/2019   Hematology Recent Labs  Lab 12/25/19 1757  WBC 5.3  RBC 3.25*  HGB 9.2*  HCT 30.0*  MCV 92.3  MCH 28.3  MCHC 30.7  RDW 15.3  PLT 195   Cardiac Enzymes High Sensitivity Troponin:   Recent Labs  Lab 12/25/19 1757  TROPONINIHS 497*      BNP Recent Labs  Lab 12/25/19 1757  BNP 776.7*   TSH:   Lab Results  Component Value Date   TSH 2.566 10/31/2018   Lipids: Lab Results  Component Value Date   CHOL 135 11/11/2018   HDL 24 (L) 11/11/2018   LDLCALC 96 11/11/2018   TRIG 75 11/11/2018   CHOLHDL 5.6 11/11/2018   HgbA1c: Lab Results  Component Value Date   HGBA1C 4.9 11/11/2018   Magnesium:  Magnesium  Date Value Ref Range Status  07/09/2019 2.0 1.7 - 2.4 mg/dL Final    Comment:    Performed at [facility], [place] [address], [place], [place] [address]     Radiology/Studies:  [facility] Chest [place] 1 View  Result Date: 12/25/2019 CLINICAL DATA:  Shortness of  breath.  Atrial fibrillation with RVR. EXAM: PORTABLE CHEST 1 VIEW COMPARISON:  07/01/2019 FINDINGS: Left-sided pacemaker in place. Cardiomegaly with moderate pulmonary edema. Unchanged mediastinal contours. No definite pleural effusion. No focal airspace disease. No pneumothorax. Chronic remote right rib fracture. No acute osseous abnormalities are seen. IMPRESSION: Cardiomegaly with moderate pulmonary edema. Electronically Signed   By: Yandiel Rake M.D.   On: 12/25/2019 18:09    Assessment and Plan:   1. Afib, RVR - in setting of CHF exacerbation, unclear which came first - because he has missed some Eliquis doses, cannot do DCCV in ER, will need TEE first.  - Eliquis compliance reinforced. - give IV amio to help w/ rate control, no IV Dilt due to low EF  2. Acute on chronic CHF - transition torsemide to IV Lasix 80 mg IV BID - should be able to go back to po in 48 hr or so - however, volume will be difficult to control while in Afib - continue other home rx - encourage home weights  Otherwise per Admitting MD  Active Problems:   * No active hospital problems. *     For questions or updates, please contact [place] Please consult [email or web address] for contact info under Cardiology/STEMI.   Signed, Rosaria Ferries, PA-C  12/25/2019 7:44 PM    Patient seen and examined and agree with Rosaria Ferries, PA-C as detailed above.  In brief, patient is a 69 year old male with history of NICM with LVEF 20% s/p Chemical engineer ICD, L MCA CVA with hemorrhagic conversion in 2018, CVT, pAfib on eliquis, CKD, HTN, HLD, tobacco use, and chronic cholecystitis with chronic perc drain who presented to the ED with worsening SOB, palpitations and LE edema found to be in Afib with RVR and mildly hypervolemic c/w acute on chronic HF exacerbation. Symptoms occurred in the setting of dietary noncompliance and likely lack of medication adherence.  Currently HD stable. Labs notable for trop 497, BNP 777, Cr 1.69 (unchanged from 12/01/19), K 3.5.  Last TTE with LVEF  20-25% with global hypokinesis. Moderate AR. Moderately enlarged and reduced RV function.   Cath 2020 with normal coronary arteries. Likely troponin elevation due to Afib with RVR and acute on chronic HF exacerbation  Exam: GEN: No acute distress.   Neck: Normal, +JVD Cardiac: Tachycardic, irregular, 2/6 diastolic murmur Respiratory: Crackles at bases GI: Soft, nontender, non-distended  MS: Warm, 1+ pitting edema Neuro:  Nonfocal  Psych: Normal affect   Plan: -Start lasix 80mg  IV BID; only mildly volume up and can likely transition to PO within 48 hours -Stop dilt gtt given LVEF<25%; start amiodarone bolus and gtt -Continue apixaban 5mg  BID (renal function stable from last admission) -Check dig level; continue home digoxin for now pending level given stable renal function -Unable to DCCV due to missed doses of eliquis -Will need continued counseling about dietary and medication adherence  Gwyndolyn Kaufman, MD

## 2019-12-25 NOTE — H&P (Addendum)
[facility] Admission History and Physical Service Pager: [phone]  Patient name: Glenn [name] Medical record number: [id number] Date of birth: 09-22-1950 Age: 69 y.o. Gender: male  Primary Care Provider: Gerlene Fee, DO Consultants: Cardiology Code Status: Full Preferred Emergency Contact: Taahir Grisby [phone]  Chief Complaint: "shortness of breath, dizziness"  Assessment and Plan: Glenn [name] is a 69 y.o. male presenting with shortness of breath and dizziness. PMH is significant for A. fib with RVR on Eliquis, CKD 3, HLD, Large left MCA infarction with hemorrhagic conversion  embolic due to A. fib (July 2018), R MCA CVA (September 2020), ICD (2018), HTN, chronic hep C, tobacco use disorder, and ischemic cardiomyopathy.   Afib RVR  Heart Failure Exacerbation: Pt brought in by [facility] for shortness of breath and dizziness.  Pt states he had been feeling dizzy and short of breath which began two days ago. He borrowed friend's inhaler which had not helped his shortness of breath. On admission he was found to be in Afib with RVR.  Patient has a history of A. fib with RVR for which he is on Eliquis 5 mg twice daily, however he reports he is not always compliant with this medication.  Patient also has a history of CHF, Last echo from April 2021 with EF 20 to 25%, he already has an ICD in place. In the ED he was started on IV Diltiazem. His BP was stable and PR were up to 150, averaging 120 and now running between 99-130. He was tachypnic but his oxygen saturation was 100%. His electrolytes were normal, BNP was high at 776. Initial Troponin's level was 497 and repeat levels shows downtrend to 440. Cardiology was consulted, recommended stopping IV dilt and starting IV amiodarone and transitioning torsemide to IV lasix 80 mg BID. Cardiology cannot do DCCV because TEE is needed first. EKG showed Atrial fibrillation with no ST changes. CBC shows  anemia with Hgb at 9.2. His COVID and influenza tests were negative. CXR showed unchanged cardiomegaly with moderate pulmonary edema.  Patient symptoms are most likely due to A. fib with RVR, as well as heart failure exacerbation. He had similar episodes in the past. EKG also confirmed A fib.  Acute on chronic heart failure is also contributing to his shortness of breath with elevated BNP and moderate pulmonary edema appreciated on chest x-ray.  Although troponins are elevated at almost 500, EKG is without signs of ST segment elevation and troponins are downtrending, which is reassuring.   -Admit to cardiac-tele, attending Dr. Ardelia Mems -Cardiology consulted, to see patient 11/6, appreciate recommendations. -Monitor vital signs per floor protocol -Continuous cardiac, pulse ox monitoring -Continue IV amiodarone -Continue Eliquis 5 mg twice daily -AM CMP, CBC, Lipid Panel, HbA1c for risk stratification -Daily weights -Continue Lasix 80 mg BID -Intake/output monitoring -Continue Digoxin 0.125 Daily -We'll switch to oral medications once HR is controlled.  -Continue Ranexa 500 mg BID. -Continue KLOR 40 mg daily Lasix dose is increased -Continue Eliquis 5 mg BID -Follow-up digoxin levels -Counsel pt about dietary and medication adherence -NPO for now pending any intervention from cardiology, otherwise can have her healthy diet  HTN:  BP on admission 133/89, most recently 123/95. At home patient takes Amlodipine 5 mg daily. -Continue Home amlodipine 5  CKD 3B:  Patient's GFR on admission 44, creatinine at baseline at 1.69. -Avoid nephrotoxic medications -Monitor creatinine with CMP  HLD: Most recent lipid panel from September 2020 showing HDL low at 24, otherwise within normal  limits.  Patient has a history of 2 prior CVAs (2018, 2020).  Patient currently prescribed atorvastatin 20 mg, would most likely benefit from high intensity statin dosing. -Continue home atorvastatin -Lipid  panel  Chronic cholecystitis  cholelithiasis with JP drain: Patient with history of cholecystitis for IR placed a biliary drain in 2020.  Due to patient's significant heart history is a poor surgical candidate for cholecystectomy.  Patient was seen in October 2021 in the emergency department when his drain got "ripped out".  Patient keeps the ostomy covered with gauze.   -Consider contacting general surgery to readdress this drainage  Chronic normocytic anemia:  Patient's hemoglobin on admission 9.2. Pt endorses fatigue. -Discuss with day team regarding starting ferrous sulfate, checking anemia panel. -AM CBC  Depression  Anxiety  Patient already on Zoloft 25 mg daily.  -Continue Zoloft 25 mg Daily -Monitor mood while hospitalized   COPD  Home medications include albuterol nebulizer Q6H PRN for SOB/wheezing -We will hold albuterol for now in the setting of tachycardia -Continue albuterol once heart rate within normal   GERD  Patient on home Protonix 40 mg daily.  -continue home Protonix    FEN/GI: Heart healthy diet, then n.p.o. at midnight for potential TEE Prophylaxis: Eliquis 5 mg twice daily  Disposition: Admit for observation to cardiac telemetry  History of Present Illness:  Glenn [name] is a 69 y.o. male presenting with shortness of breath and dizziness found to be in Afib and having a CHF exacerbation. Pt states a couple of days ago he ate canned turnips and a roast with "soul seasoning" which had a lot of salt, since then he felt he was collecting more fluid in his body and then he flipped into afib. He used his friend's albuterol to improve his shortness of breath but this was ineffective. He is also having shortness of breath and chest tightness. He states his chest pain travels to Lt arm and left side of neck. Endorses dry cough, denies Headche, vomiting, abdominal pain constipation, and diarrhea. He has a gallbladder perc tube in his R side that is unchanged. He  denies loss of consciousness, had achiness in his L shoulder, L neck, and left chest. He endorses having some anxiety. He states his chest pain is getting better since he came here.   Review Of Systems: Per HPI with the following additions: Review of Systems  Constitutional: Negative for chills and fever.  Respiratory: Positive for shortness of breath.   Cardiovascular: Positive for chest pain.  Gastrointestinal: Negative for abdominal pain, constipation, diarrhea, nausea and vomiting.  Neurological: Positive for dizziness. Negative for headaches.     Patient Active Problem List   Diagnosis Date Noted  . Anxiety 09/02/2019  . Malnutrition of moderate degree 07/14/2019  . Palliative care by specialist   . Atrial fibrillation with RVR ([place]) 06/23/2019  . Chronic systolic heart failure ([place]) 06/21/2019  . Persistent atrial fibrillation with rapid ventricular response ([place])   . Thrombus of left atrial appendage   . Left leg pain 03/27/2019  . History of hemorrhagic stroke with residual hemiparesis ([place]) 02/04/2019  . Left leg DVT ([place]) 02/04/2019  . Chronic cholecystitis 02/04/2019  . Acute deep vein thrombosis (DVT) of both lower extremities ([name])   . Acute blood loss anemia   . Labile blood glucose   . CKD (chronic kidney disease) stage 3, GFR 30-59 ml/min ([name])   . Chronic right hip pain   . Headache due to intracranial disease 11/14/2018  .  Trochanteric bursitis, right hip 11/14/2018  . Cerebral edema ([place]) 11/13/2018  . Hyperlipidemia LDL goal <70 11/13/2018  . Marijuana user 11/13/2018  . Right middle cerebral artery stroke ([place]) 11/13/2018  . Acute CVA (cerebrovascular accident) ([place])   . Noncompliance   . Dysphagia, post-stroke   . Acute on chronic combined systolic and diastolic CHF (congestive heart failure) ([place])   . DCM (dilated cardiomyopathy) ([place])   . Atrial fibrillation ([place]) 11/04/2018  . Elevated troponin   . Acute respiratory failure with hypoxia ([place])   .  Entrapment of right ulnar nerve 02/28/2018  . Carpal tunnel syndrome of right wrist 02/28/2018  . Impotence due to erectile dysfunction 09/30/2017  . Solitary pulmonary nodule 06/10/2017  . Neck pain 04/06/2017  . Paroxysmal atrial fibrillation ([name])   . ICD (implantable cardioverter-defibrillator) in place 09/13/2016  . Chest pain 09/13/2016  . Tobacco abuse 09/13/2016  . Upper back pain 09/13/2016  . Housing problems 09/13/2016  . Ischemic cardiomyopathy   . Cerebral infarction ([place])   . Stroke (cerebrum) ([name]) Lg L MCA infarct w/ hemorrhagic conversion, embolic d/t AF 16/11/9602  . HTN (hypertension) 08/14/2016  . Chronic Hepatitis C  08/14/2016    Past Medical History: Past Medical History:  Diagnosis Date  . Acute blood loss anemia   . Acute CVA (cerebrovascular accident) ([place])   . Acute deep vein thrombosis (DVT) of both lower extremities ([name])   . Acute kidney injury ([place])   . Acute on chronic combined systolic and diastolic CHF (congestive heart failure) ([place])   . Acute renal failure superimposed on stage 3a chronic kidney disease ([place])   . Acute respiratory failure with hypoxia ([place])   . Atrial fibrillation ([place])   . Carpal tunnel syndrome of right wrist 02/28/2018  . Cerebral edema ([place]) 11/13/2018  . Cerebral infarction ([place])   . CHF (congestive heart failure) ([place])   . Cholecystitis 02/04/2019  . Chronic right hip pain   . DCM (dilated cardiomyopathy) ([place])   . Dysphagia, post-stroke   . Elevated troponin   . Entrapment of right ulnar nerve 02/28/2018  . Headache due to intracranial disease 11/14/2018  . Hepatitis C   . History of hemorrhagic stroke with residual hemiparesis ([place]) 02/04/2019  . HTN (hypertension) 08/14/2016  . Hyperlipidemia LDL goal <70 11/13/2018  . Hypertension   . ICD (implantable cardioverter-defibrillator) in place 09/13/2016  . Impotence due to erectile dysfunction 09/30/2017  . Ischemic cardiomyopathy   . Labile blood glucose   . Left leg  DVT ([place]) 02/04/2019  . Marijuana user 11/13/2018  . Paroxysmal atrial fibrillation ([name])   . Right middle cerebral artery stroke ([place]) 11/13/2018  . Solitary pulmonary nodule 06/10/2017   5 mm RUL nodule noted incidentally as part of CVA workup 08/2016. With smoking history would obtain low-dose CT scan 08/2017.   . Stroke (cerebrum) ([name]) Lg L MCA infarct w/ hemorrhagic conversion, embolic d/t AF 5/40/9811  . Stroke ([place])   . Trochanteric bursitis, right hip 11/14/2018  . Visit for monitoring Tikosyn therapy 03/26/2017    Past Surgical History: Past Surgical History:  Procedure Laterality Date  . CARDIAC DEFIBRILLATOR PLACEMENT  2015  . CARDIOVERSION N/A 10/10/2016   Procedure: CARDIOVERSION;  Surgeon: Dorothy Spark, MD;  Location: [facility] ENDOSCOPY;  Service: Cardiovascular;  Laterality: N/A;  . CARDIOVERSION N/A 03/27/2017   Procedure: CARDIOVERSION;  Surgeon: Jerline Pain, MD;  Location: [facility] ENDOSCOPY;  Service: Cardiovascular;  Laterality: N/A;  . CARDIOVERSION N/A 10/29/2018   Procedure: CARDIOVERSION;  Surgeon: Sanda Klein, MD;  Location: [facility] ENDOSCOPY;  Service: Cardiovascular;  Laterality: N/A;  . CARDIOVERSION N/A 11/05/2018   Procedure: CARDIOVERSION;  Surgeon: Thayer Headings, MD;  Location: [facility] ENDOSCOPY;  Service: Cardiovascular;  Laterality: N/A;  . CARDIOVERSION N/A 07/02/2019   Procedure: CARDIOVERSION;  Surgeon: Jolaine Artist, MD;  Location: [place];  Service: Cardiovascular;  Laterality: N/A;  . CARDIOVERSION N/A 07/14/2019   Procedure: CARDIOVERSION;  Surgeon: Jolaine Artist, MD;  Location: [place];  Service: Cardiovascular;  Laterality: N/A;  . EYE SURGERY Left 1990  . IABP INSERTION N/A 06/26/2019   Procedure: IABP INSERTION;  Surgeon: Jolaine Artist, MD;  Location: [place] CV LAB;  Service: Cardiovascular;  Laterality: N/A;  . IR CATHETER TUBE CHANGE  06/24/2019  . IR EXCHANGE BILIARY DRAIN  02/11/2019  . IR EXCHANGE BILIARY DRAIN  06/09/2019  . IR  EXCHANGE BILIARY DRAIN  09/23/2019  . IR IVC FILTER PLMT / S&I /IMG GUID/MOD SED  12/04/2018  . IR PERC CHOLECYSTOSTOMY  12/13/2018  . IR PERCUTANEOUS ART THROMBECTOMY/INFUSION INTRACRANIAL INC DIAG ANGIO  09/05/2016  . IR RADIOLOGIST EVAL & MGMT  10/03/2016  . RADIOLOGY WITH ANESTHESIA N/A 09/05/2016   Procedure: RADIOLOGY WITH ANESTHESIA;  Surgeon: Luanne Bras, MD;  Location: [place];  Service: Radiology;  Laterality: N/A;  . RIGHT HEART CATH N/A 06/24/2019   Procedure: RIGHT HEART CATH;  Surgeon: Jolaine Artist, MD;  Location: [place] CV LAB;  Service: Cardiovascular;  Laterality: N/A;  . RIGHT/LEFT HEART CATH AND CORONARY ANGIOGRAPHY N/A 11/03/2018   Procedure: RIGHT/LEFT HEART CATH AND CORONARY ANGIOGRAPHY;  Surgeon: Lorretta Harp, MD;  Location: [place] CV LAB;  Service: Cardiovascular;  Laterality: N/A;  . TEE WITHOUT CARDIOVERSION N/A 02/06/2019   Procedure: TRANSESOPHAGEAL ECHOCARDIOGRAM (TEE);  Surgeon: Skeet Latch, MD;  Location: [place];  Service: Cardiovascular;  Laterality: N/A;  . TEE WITHOUT CARDIOVERSION N/A 06/09/2019   Procedure: TRANSESOPHAGEAL ECHOCARDIOGRAM (TEE);  Surgeon: Buford Dresser, MD;  Location: [facility] ENDOSCOPY;  Service: Cardiovascular;  Laterality: N/A;    Social History: Social History   Tobacco Use  . Smoking status: Former Smoker    Packs/day: 0.50    Types: Cigarettes  . Smokeless tobacco: Never Used  . Tobacco comment: a pack last three days  Vaping Use  . Vaping Use: Never used  Substance Use Topics  . Alcohol use: Not Currently    Alcohol/week: 3.0 standard drinks    Types: 3 Cans of beer per week    Comment: pt stop drinking   . Drug use: Not Currently    Frequency: 2.0 times per week    Types: Marijuana    Comment: stop smoking    Additional social history:  Please also refer to relevant sections of EMR.  Family History: Family History  Problem Relation Age of Onset  . High blood pressure Mother   .  High blood pressure Father   . Stroke Maternal Aunt   . Heart disease Neg Hx      Allergies and Medications: Allergies  Allergen Reactions  . Benadryl [Diphenhydramine] Palpitations   No current facility-administered medications on file prior to encounter.   Current Outpatient Medications on File Prior to Encounter  Medication Sig Dispense Refill  . acetaminophen (TYLENOL) 325 MG tablet Take 2 tablets (650 mg total) by mouth every 6 (six) hours as needed for mild pain (or Fever >/= 101).    Marland Kitchen ALPRAZolam (XANAX) 0.25 MG tablet Take 1 tablet (0.25 mg total)  by mouth 2 (two) times daily as needed for anxiety. 30 tablet 0  . amiodarone (PACERONE) 200 MG tablet Take 1 tablet (200 mg total) by mouth every 8 (eight) hours.    Marland Kitchen amLODipine (NORVASC) 5 MG tablet Take 1 tablet (5 mg total) by mouth at bedtime. 90 tablet 3  . apixaban (ELIQUIS) 5 MG TABS tablet Take 1 tablet (5 mg total) by mouth 2 (two) times daily. 180 tablet 1  . atorvastatin (LIPITOR) 20 MG tablet TAKE 1 TABLET(20 MG) BY MOUTH DAILY AT 6 PM (Patient taking differently: Take 20 mg by mouth every evening. ) 90 tablet 0  . calcium carbonate (TUMS - DOSED IN MG ELEMENTAL CALCIUM) 500 MG chewable tablet Chew 1 tablet (200 mg of elemental calcium total) by mouth daily as needed for indigestion or heartburn. 30 tablet 1  . digoxin (LANOXIN) 0.125 MG tablet Take 1 tablet (0.125 mg total) by mouth daily. 90 tablet 3  . docusate sodium (COLACE) 100 MG capsule Take 1 capsule (100 mg total) by mouth 2 (two) times daily. 10 capsule 0  . gabapentin (NEURONTIN) 300 MG capsule Take 2 capsules (600 mg total) by mouth 3 (three) times daily. 90 capsule 0  . levalbuterol (XOPENEX) 0.63 MG/3ML nebulizer solution Take 3 mLs (0.63 mg total) by nebulization every 6 (six) hours as needed for wheezing or shortness of breath. 3 mL 12  . metoCLOPramide (REGLAN) 5 MG tablet Take 1 tablet (5 mg total) by mouth 3 (three) times daily.    . ondansetron (ZOFRAN)  4 MG tablet Take 1 tablet (4 mg total) by mouth every 6 (six) hours as needed for nausea. 20 tablet 0  . pantoprazole (PROTONIX) 40 MG tablet Take 1 tablet (40 mg total) by mouth daily.    . potassium chloride SA (KLOR-CON) 20 MEQ tablet TAKE 2 TABLETS BY MOUTH ONCE DAILY FOR LOW POTASSIUM LEVELS 180 tablet 2  . ranolazine (RANEXA) 500 MG 12 hr tablet TAKE 1 TABLET BY MOUTH EVERY 12 HOURS FOR ATRIAL FIBRILLATION 180 tablet 2  . sertraline (ZOLOFT) 25 MG tablet Take 1 tablet (25 mg total) by mouth daily. 30 tablet 0  . torsemide (DEMADEX) 20 MG tablet Take 2 tablets (40 mg total) by mouth 2 (two) times daily.    . traMADol (ULTRAM) 50 MG tablet Take 1 tablet (50 mg total) by mouth every 8 (eight) hours as needed for severe pain. 30 tablet 0    Objective: BP 122/79   Pulse (!) 119   Temp 98.6 F (37 C) (Oral)   Resp (!) 35   Ht 5\' 7"  (1.702 m)   Wt 167 lb (75.8 kg)   SpO2 97%   BMI 26.16 kg/m  Exam: GEN: Pt is cooperative, anxious, Oriented x 4 Neck:  Supple, trachea midline, no thyromegaly, no lymphadenopathy Cardiac:  Irregularly irregular rhythm, tachycardic Respiratory: Crackles and wheeze present B/L GI:  non-distended, Pt RUQ percutaneous drainage site present, green fluid seen on gauze Skin: Warm, 1+ B/l pitting edema present more on Lt side Neuro:  No focal deficit Psych:  Affect -Appropriate  Labs and Imaging: CBC BMET  Recent Labs  Lab 12/25/19 1757  WBC 5.3  HGB 9.2*  HCT 30.0*  PLT 195   Recent Labs  Lab 12/25/19 1757  NA 139  K 3.5  CL 105  CO2 22  BUN 11  CREATININE 1.69*  GLUCOSE 105*  CALCIUM 8.8*     EKG: EKG Interpretation  Date/Time:  Friday December 25 2019 17:02:46 EDT Ventricular Rate:  127 PR Interval:    QRS Duration: 111 QT Interval:  359 QTC Calculation: 522 R Axis:   -33 Text Interpretation: Atrial fibrillation with RVR Ventricular premature complex Left axis deviation Borderline T abnormalities, lateral leads Prolonged QT interval  Confirmed by Sherwood Gambler [phone]) on 12/25/2019 5:13:28 PM   Armando Reichert, MD 12/26/2019, 12:15 AM PGY-1, [place] Intern pager: [phone], text pages welcome  FPTS Upper-Level Resident Addendum  I have independently interviewed and examined the patient. I have discussed the above with the original author and agree with their documentation. My edits for correction/addition/clarification are in italics. Please see also any attending notes.    Milus Banister, DO PGY-3, [facility] 12/26/2019 5:49 AM  FPTS Service pager: [phone] (text pages welcome through [facility])

## 2019-12-25 NOTE — ED Triage Notes (Signed)
Pt brought in by [facility] for Afib rvr, rate up to 150p, averaging 120. Pt states that when he is in afib he typically feels dizzy which began two days ago. However, pt also developed Sob two days ago (*hx CHF) and borrowed friend's inhaler. Per friend the patient used 30 days worth of abuterol in less than one, and used more today/

## 2019-12-25 NOTE — ED Provider Notes (Signed)
[facility] EMERGENCY DEPARTMENT Provider Note   CSN: [id number] Arrival date & time: 12/25/19  1702     History Chief Complaint  Patient presents with  . Chest Pain    Glenn [name] is a 69 y.o. male.  The history is provided by the patient.  Chest Pain Pain location:  L chest Pain quality: sharp   Pain radiates to:  Neck Pain severity:  Moderate Onset quality:  Gradual Duration:  1 day Timing:  Constant Progression:  Worsening Chronicity:  Recurrent Relieved by:  Nothing Worsened by:  Nothing Associated symptoms: lower extremity edema, palpitations and shortness of breath   Associated symptoms: no abdominal pain, no back pain, no cough, no fever and no vomiting        Past Medical History:  Diagnosis Date  . Acute blood loss anemia   . Acute CVA (cerebrovascular accident) ([place])   . Acute deep vein thrombosis (DVT) of both lower extremities ([name])   . Acute kidney injury ([place])   . Acute on chronic combined systolic and diastolic CHF (congestive heart failure) ([place])   . Acute renal failure superimposed on stage 3a chronic kidney disease ([place])   . Acute respiratory failure with hypoxia ([place])   . Atrial fibrillation ([place])   . Carpal tunnel syndrome of right wrist 02/28/2018  . Cerebral edema ([place]) 11/13/2018  . Cerebral infarction ([place])   . CHF (congestive heart failure) ([place])   . Cholecystitis 02/04/2019  . Chronic right hip pain   . DCM (dilated cardiomyopathy) ([place])   . Dysphagia, post-stroke   . Elevated troponin   . Entrapment of right ulnar nerve 02/28/2018  . Headache due to intracranial disease 11/14/2018  . Hepatitis C   . History of hemorrhagic stroke with residual hemiparesis ([place]) 02/04/2019  . HTN (hypertension) 08/14/2016  . Hyperlipidemia LDL goal <70 11/13/2018  . Hypertension   . ICD (implantable cardioverter-defibrillator) in place 09/13/2016  . Impotence due to erectile dysfunction 09/30/2017  . Ischemic cardiomyopathy    . Labile blood glucose   . Left leg DVT ([place]) 02/04/2019  . Marijuana user 11/13/2018  . Paroxysmal atrial fibrillation ([name])   . Right middle cerebral artery stroke ([place]) 11/13/2018  . Solitary pulmonary nodule 06/10/2017   5 mm RUL nodule noted incidentally as part of CVA workup 08/2016. With smoking history would obtain low-dose CT scan 08/2017.   . Stroke (cerebrum) ([name]) Lg L MCA infarct w/ hemorrhagic conversion, embolic d/t AF 8/84/1660  . Stroke ([place])   . Trochanteric bursitis, right hip 11/14/2018  . Visit for monitoring Tikosyn therapy 03/26/2017    Patient Active Problem List   Diagnosis Date Noted  . Anxiety 09/02/2019  . Malnutrition of moderate degree 07/14/2019  . Palliative care by specialist   . Atrial fibrillation with RVR ([place]) 06/23/2019  . Chronic systolic heart failure ([place]) 06/21/2019  . Persistent atrial fibrillation with rapid ventricular response ([place])   . Thrombus of left atrial appendage   . Left leg pain 03/27/2019  . History of hemorrhagic stroke with residual hemiparesis ([place]) 02/04/2019  . Left leg DVT ([place]) 02/04/2019  . Chronic cholecystitis 02/04/2019  . Acute deep vein thrombosis (DVT) of both lower extremities ([name])   . Acute blood loss anemia   . Labile blood glucose   . CKD (chronic kidney disease) stage 3, GFR 30-59 ml/min ([name])   . Chronic right hip pain   . Headache due to intracranial disease 11/14/2018  . Trochanteric bursitis, right hip  11/14/2018  . Cerebral edema ([place]) 11/13/2018  . Hyperlipidemia LDL goal <70 11/13/2018  . Marijuana user 11/13/2018  . Right middle cerebral artery stroke ([place]) 11/13/2018  . Acute CVA (cerebrovascular accident) ([place])   . Noncompliance   . Dysphagia, post-stroke   . Acute on chronic combined systolic and diastolic CHF (congestive heart failure) ([place])   . DCM (dilated cardiomyopathy) ([place])   . Atrial fibrillation ([place]) 11/04/2018  . Elevated troponin   . Acute respiratory failure with hypoxia ([place])    . Entrapment of right ulnar nerve 02/28/2018  . Carpal tunnel syndrome of right wrist 02/28/2018  . Impotence due to erectile dysfunction 09/30/2017  . Solitary pulmonary nodule 06/10/2017  . Neck pain 04/06/2017  . Paroxysmal atrial fibrillation ([name])   . ICD (implantable cardioverter-defibrillator) in place 09/13/2016  . Chest pain 09/13/2016  . Tobacco abuse 09/13/2016  . Upper back pain 09/13/2016  . Housing problems 09/13/2016  . Ischemic cardiomyopathy   . Cerebral infarction ([place])   . Stroke (cerebrum) ([name]) Lg L MCA infarct w/ hemorrhagic conversion, embolic d/t AF 11/57/2620  . HTN (hypertension) 08/14/2016  . Chronic Hepatitis C  08/14/2016    Past Surgical History:  Procedure Laterality Date  . CARDIAC DEFIBRILLATOR PLACEMENT  2015  . CARDIOVERSION N/A 10/10/2016   Procedure: CARDIOVERSION;  Surgeon: Dorothy Spark, MD;  Location: [facility];  Service: Cardiovascular;  Laterality: N/A;  . CARDIOVERSION N/A 03/27/2017   Procedure: CARDIOVERSION;  Surgeon: Jerline Pain, MD;  Location: [facility];  Service: Cardiovascular;  Laterality: N/A;  . CARDIOVERSION N/A 10/29/2018   Procedure: CARDIOVERSION;  Surgeon: Sanda Klein, MD;  Location: [facility] ENDOSCOPY;  Service: Cardiovascular;  Laterality: N/A;  . CARDIOVERSION N/A 11/05/2018   Procedure: CARDIOVERSION;  Surgeon: Thayer Headings, MD;  Location: [facility] ENDOSCOPY;  Service: Cardiovascular;  Laterality: N/A;  . CARDIOVERSION N/A 07/02/2019   Procedure: CARDIOVERSION;  Surgeon: Jolaine Artist, MD;  Location: [facility];  Service: Cardiovascular;  Laterality: N/A;  . CARDIOVERSION N/A 07/14/2019   Procedure: CARDIOVERSION;  Surgeon: Jolaine Artist, MD;  Location: [facility];  Service: Cardiovascular;  Laterality: N/A;  . EYE SURGERY Left 1990  . IABP INSERTION N/A 06/26/2019   Procedure: IABP INSERTION;  Surgeon: Jolaine Artist, MD;  Location: [facility] CV LAB;  Service: Cardiovascular;  Laterality: N/A;  . IR  CATHETER TUBE CHANGE  06/24/2019  . IR EXCHANGE BILIARY DRAIN  02/11/2019  . IR EXCHANGE BILIARY DRAIN  06/09/2019  . IR EXCHANGE BILIARY DRAIN  09/23/2019  . IR IVC FILTER PLMT / S&I /IMG GUID/MOD SED  12/04/2018  . IR PERC CHOLECYSTOSTOMY  12/13/2018  . IR PERCUTANEOUS ART THROMBECTOMY/INFUSION INTRACRANIAL INC DIAG ANGIO  09/05/2016  . IR RADIOLOGIST EVAL & MGMT  10/03/2016  . RADIOLOGY WITH ANESTHESIA N/A 09/05/2016   Procedure: RADIOLOGY WITH ANESTHESIA;  Surgeon: Luanne Bras, MD;  Location: [facility];  Service: Radiology;  Laterality: N/A;  . RIGHT HEART CATH N/A 06/24/2019   Procedure: RIGHT HEART CATH;  Surgeon: Jolaine Artist, MD;  Location: [facility] CV LAB;  Service: Cardiovascular;  Laterality: N/A;  . RIGHT/LEFT HEART CATH AND CORONARY ANGIOGRAPHY N/A 11/03/2018   Procedure: RIGHT/LEFT HEART CATH AND CORONARY ANGIOGRAPHY;  Surgeon: Lorretta Harp, MD;  Location: [facility] CV LAB;  Service: Cardiovascular;  Laterality: N/A;  . TEE WITHOUT CARDIOVERSION N/A 02/06/2019   Procedure: TRANSESOPHAGEAL ECHOCARDIOGRAM (TEE);  Surgeon: Skeet Latch, MD;  Location: [facility];  Service: Cardiovascular;  Laterality: N/A;  .  TEE WITHOUT CARDIOVERSION N/A 06/09/2019   Procedure: TRANSESOPHAGEAL ECHOCARDIOGRAM (TEE);  Surgeon: Buford Dresser, MD;  Location: [facility] ENDOSCOPY;  Service: Cardiovascular;  Laterality: N/A;       Family History  Problem Relation Age of Onset  . High blood pressure Mother   . High blood pressure Father   . Stroke Maternal Aunt   . Heart disease Neg Hx     Social History   Tobacco Use  . Smoking status: Former Smoker    Packs/day: 0.50    Types: Cigarettes  . Smokeless tobacco: Never Used  . Tobacco comment: a pack last three days  Vaping Use  . Vaping Use: Never used  Substance Use Topics  . Alcohol use: Not Currently    Alcohol/week: 3.0 standard drinks    Types: 3 Cans of beer per week    Comment: pt stop drinking   . Drug use: Not  Currently    Frequency: 2.0 times per week    Types: Marijuana    Comment: stop smoking     Home Medications Prior to Admission medications   Medication Sig Start Date End Date Taking? Authorizing Provider  acetaminophen (TYLENOL) 325 MG tablet Take 2 tablets (650 mg total) by mouth every 6 (six) hours as needed for mild pain (or Fever >/= 101). 07/16/19   Clegg, Amy D, NP  ALPRAZolam (XANAX) 0.25 MG tablet Take 1 tablet (0.25 mg total) by mouth 2 (two) times daily as needed for anxiety. 07/16/19   Clegg, Amy D, NP  amiodarone (PACERONE) 200 MG tablet Take 1 tablet (200 mg total) by mouth every 8 (eight) hours. 07/16/19   Clegg, Amy D, NP  amLODipine (NORVASC) 5 MG tablet Take 1 tablet (5 mg total) by mouth at bedtime. 09/09/19   Benay Pike, MD  apixaban (ELIQUIS) 5 MG TABS tablet Take 1 tablet (5 mg total) by mouth 2 (two) times daily. 09/24/19   [name], MD  atorvastatin (LIPITOR) 20 MG tablet TAKE 1 TABLET(20 MG) BY MOUTH DAILY AT 6 PM Patient taking differently: Take 20 mg by mouth every evening.  03/13/19   [name], MD  calcium carbonate (TUMS - DOSED IN MG ELEMENTAL CALCIUM) 500 MG chewable tablet Chew 1 tablet (200 mg of elemental calcium total) by mouth daily as needed for indigestion or heartburn. 03/12/19   [name], MD  digoxin (LANOXIN) 0.125 MG tablet Take 1 tablet (0.125 mg total) by mouth daily. 08/11/19   Baldwin Jamaica, PA-C  docusate sodium (COLACE) 100 MG capsule Take 1 capsule (100 mg total) by mouth 2 (two) times daily. 07/16/19   Clegg, Amy D, NP  gabapentin (NEURONTIN) 300 MG capsule Take 2 capsules (600 mg total) by mouth 3 (three) times daily. 09/11/19   Autry-Lott, Naaman Plummer, DO  levalbuterol (XOPENEX) 0.63 MG/3ML nebulizer solution Take 3 mLs (0.63 mg total) by nebulization every 6 (six) hours as needed for wheezing or shortness of breath. 07/16/19   Clegg, Amy D, NP  metoCLOPramide (REGLAN) 5 MG tablet Take 1 tablet (5 mg total) by mouth 3 (three)  times daily. 07/16/19   Clegg, Amy D, NP  ondansetron (ZOFRAN) 4 MG tablet Take 1 tablet (4 mg total) by mouth every 6 (six) hours as needed for nausea. 07/16/19   Clegg, Amy D, NP  pantoprazole (PROTONIX) 40 MG tablet Take 1 tablet (40 mg total) by mouth daily. 07/16/19   Clegg, Amy D, NP  potassium chloride SA (KLOR-CON) 20 MEQ tablet TAKE 2 TABLETS  BY MOUTH ONCE DAILY FOR LOW POTASSIUM LEVELS 09/28/19   [name], MD  ranolazine (RANEXA) 500 MG 12 hr tablet TAKE 1 TABLET BY MOUTH EVERY 12 HOURS FOR ATRIAL FIBRILLATION 09/28/19   [name], MD  sertraline (ZOLOFT) 25 MG tablet Take 1 tablet (25 mg total) by mouth daily. 09/01/19   Leeanne Rio, MD  torsemide (DEMADEX) 20 MG tablet Take 2 tablets (40 mg total) by mouth 2 (two) times daily. 07/16/19   Clegg, Amy D, NP  traMADol (ULTRAM) 50 MG tablet Take 1 tablet (50 mg total) by mouth every 8 (eight) hours as needed for severe pain. 03/12/19   [name], MD    Allergies    Benadryl [diphenhydramine]  Review of Systems   Review of Systems  Constitutional: Negative for chills and fever.  HENT: Negative for ear pain and sore throat.   Eyes: Negative for pain and visual disturbance.  Respiratory: Positive for shortness of breath. Negative for cough.   Cardiovascular: Positive for chest pain and palpitations.  Gastrointestinal: Negative for abdominal pain and vomiting.  Genitourinary: Negative for dysuria and hematuria.  Musculoskeletal: Negative for arthralgias and back pain.  Skin: Negative for color change and rash.  Neurological: Negative for seizures and syncope.  All other systems reviewed and are negative.   Physical Exam Updated Vital Signs BP 124/88   Pulse 97   Temp 98.6 F (37 C) (Oral)   Resp (!) 23   Ht 5\' 7"  (1.702 m)   Wt 75.8 kg   SpO2 98%   BMI 26.16 kg/m   Physical Exam Vitals and nursing note reviewed.  Constitutional:      Appearance: He is ill-appearing (appears chronically ill). He is not  toxic-appearing or diaphoretic.  HENT:     Head: Normocephalic and atraumatic.     Mouth/Throat:     Mouth: Mucous membranes are moist.     Pharynx: Oropharynx is clear.  Eyes:     Conjunctiva/sclera: Conjunctivae normal.  Cardiovascular:     Rate and Rhythm: Tachycardia present. Rhythm irregularly irregular.     Pulses:          Radial pulses are 2+ on the right side and 2+ on the left side.       Dorsalis pedis pulses are 2+ on the right side and 2+ on the left side.     Heart sounds: No murmur heard.  No gallop.   Pulmonary:     Effort: Pulmonary effort is normal. No respiratory distress.     Breath sounds: Examination of the right-lower field reveals rales. Examination of the left-lower field reveals rales. Wheezing and rales present.  Abdominal:     Palpations: Abdomen is soft.     Tenderness: There is no abdominal tenderness.  Musculoskeletal:     Cervical back: Neck supple.     Right lower leg: 2+ Pitting Edema present.     Left lower leg: 2+ Pitting Edema present.  Skin:    General: Skin is warm and dry.  Neurological:     Mental Status: He is alert and oriented to person, place, and time.     ED Results / Procedures / Treatments   Labs (all labs ordered are listed, but only abnormal results are displayed) Labs Reviewed  CBC WITH DIFFERENTIAL/PLATELET - Abnormal; Notable for the following components:      Result Value   RBC 3.25 (*)    Hemoglobin 9.2 (*)    HCT 30.0 (*)  All other components within normal limits  BRAIN NATRIURETIC PEPTIDE - Abnormal; Notable for the following components:   B Natriuretic Peptide 776.7 (*)    All other components within normal limits  BASIC METABOLIC PANEL - Abnormal; Notable for the following components:   Glucose, Bld 105 (*)    Creatinine, Ser 1.69 (*)    Calcium 8.8 (*)    GFR, Estimated 44 (*)    All other components within normal limits  TROPONIN I (HIGH SENSITIVITY) - Abnormal; Notable for the following components:    Troponin I (High Sensitivity) 497 (*)    All other components within normal limits  TROPONIN I (HIGH SENSITIVITY) - Abnormal; Notable for the following components:   Troponin I (High Sensitivity) 440 (*)    All other components within normal limits  RESPIRATORY PANEL BY RT PCR (FLU A&B, COVID)  DIGOXIN LEVEL  HIV ANTIBODY (ROUTINE TESTING W REFLEX)  LIPID PANEL  CBC  HEMOGLOBIN A1C  TROPONIN I (HIGH SENSITIVITY)    EKG EKG Interpretation  Date/Time:  Friday December 25 2019 17:02:46 EDT Ventricular Rate:  127 PR Interval:    QRS Duration: 111 QT Interval:  359 QTC Calculation: 522 R Axis:   -33 Text Interpretation: Atrial fibrillation with RVR Ventricular premature complex Left axis deviation Borderline T abnormalities, lateral leads Prolonged QT interval Confirmed by Sherwood Gambler [phone]) on 12/25/2019 5:13:28 PM   Radiology DG Chest Port 1 View  Result Date: 12/25/2019 CLINICAL DATA:  Shortness of breath.  Atrial fibrillation with RVR. EXAM: PORTABLE CHEST 1 VIEW COMPARISON:  07/01/2019 FINDINGS: Left-sided pacemaker in place. Cardiomegaly with moderate pulmonary edema. Unchanged mediastinal contours. No definite pleural effusion. No focal airspace disease. No pneumothorax. Chronic remote right rib fracture. No acute osseous abnormalities are seen. IMPRESSION: Cardiomegaly with moderate pulmonary edema. Electronically Signed   By: Yolanda Rake M.D.   On: 12/25/2019 18:09    Procedures Procedures (including critical care time)  Medications Ordered in ED Medications  amiodarone (NEXTERONE) 1.8 mg/mL load via infusion 150 mg (150 mg Intravenous Bolus from Bag 12/25/19 2142)    Followed by  amiodarone (NEXTERONE PREMIX) 360-4.14 MG/200ML-% (1.8 mg/mL) IV infusion (60 mg/hr Intravenous New Bag/Given 12/25/19 2142)    Followed by  amiodarone (NEXTERONE PREMIX) 360-4.14 MG/200ML-% (1.8 mg/mL) IV infusion (has no administration in time range)  furosemide (LASIX) injection 80  mg (has no administration in time range)  apixaban (ELIQUIS) tablet 5 mg (has no administration in time range)  atorvastatin (LIPITOR) tablet 20 mg (has no administration in time range)  pantoprazole (PROTONIX) EC tablet 40 mg (has no administration in time range)  ranolazine (RANEXA) 12 hr tablet 500 mg (has no administration in time range)  digoxin (LANOXIN) tablet 0.0625 mg (has no administration in time range)  sertraline (ZOLOFT) tablet 25 mg (has no administration in time range)  potassium chloride SA (KLOR-CON) CR tablet 40 mEq (has no administration in time range)  amLODipine (NORVASC) tablet 5 mg (has no administration in time range)  calcium carbonate (TUMS - dosed in mg elemental calcium) chewable tablet 200 mg of elemental calcium (has no administration in time range)  gabapentin (NEURONTIN) capsule 600 mg (has no administration in time range)  acetaminophen (TYLENOL) tablet 650 mg (has no administration in time range)  ondansetron (ZOFRAN) injection 4 mg (has no administration in time range)  hydrOXYzine (ATARAX/VISTARIL) tablet 25 mg (has no administration in time range)  diltiazem (CARDIZEM) 1 mg/mL load via infusion 10 mg (10 mg Intravenous Bolus from  Bag 12/25/19 1809)  potassium chloride SA (KLOR-CON) CR tablet 40 mEq (40 mEq Oral Given 12/25/19 2001)    ED Course  I have reviewed the triage vital signs and the nursing notes.  Pertinent labs & imaging results that were available during my care of the patient were reviewed by me and considered in my medical decision making (see chart for details).    MDM Rules/Calculators/A&P                          The patient is a 69yo male, PMH CHF last EF 20-25%, HTN, afib on eliquis, DM, CKD who presents to the ED via EMS for SOB.  On my initial evaluation, the patient is tachycardic but otherwise hemodynamically stable, afebrile, nontoxic-appearing. Physical exam remarkable for irregular tachycardia and bibasilar rales, BLE  edema.  Differentials considered include CHF exacerbation, COPD exacerbation, afib RVR. I am most concerned for CHF exacerbation that precipitated afib RVR.  Patient otherwise stable, mentating appropriately, do not think patient requires emergent cardioversion.  Due to multiple comorbidities, will aim for rate control rather than rhythm control.  Diltiazem IV bolus and infusion ordered.  EKG confirming A. fib RVR.   Work-up remarkable for markedly elevated troponins, anemia, negative Covid test.  Chest x-ray with cardiomegaly and pulmonary edema.  IV Lasix provided for concern for CHF exacerbation, and since potassium was low end of normal, provided p.o. potassium.  On reevaluation, patient's heart rates mildly better but still at times tachycardic to 120s and 130s.  Consulted cardiology for recommendations on A. fib RVR, CHF exacerbation, and significant elevated troponins.  Consulted family medicine for admission for his multiple medical problems.  Cardiology initially recommended cessation of diltiazem and initiation of amiodarone, which was ordered.  No further acute events while under my care.  The care of this patient was overseen by Dr. Regenia Skeeter, who agreed with evaluation and plan of care.   Final Clinical Impression(s) / ED Diagnoses Final diagnoses:  Atrial fibrillation with RVR ([place])  Acute on chronic congestive heart failure, unspecified heart failure type [facility])    Rx / DC Orders ED Discharge Orders    None       Launa Flight, MD 12/25/19 2340    Sherwood Gambler, MD 12/26/19 [phone]

## 2019-12-26 ENCOUNTER — Other Ambulatory Visit: Payer: Self-pay

## 2019-12-26 DIAGNOSIS — I5082 Biventricular heart failure: Secondary | ICD-10-CM | POA: Diagnosis present

## 2019-12-26 DIAGNOSIS — N1832 Chronic kidney disease, stage 3b: Secondary | ICD-10-CM | POA: Diagnosis present

## 2019-12-26 DIAGNOSIS — I4891 Unspecified atrial fibrillation: Secondary | ICD-10-CM | POA: Diagnosis present

## 2019-12-26 DIAGNOSIS — F32A Depression, unspecified: Secondary | ICD-10-CM | POA: Diagnosis present

## 2019-12-26 DIAGNOSIS — I509 Heart failure, unspecified: Secondary | ICD-10-CM | POA: Diagnosis not present

## 2019-12-26 DIAGNOSIS — I4819 Other persistent atrial fibrillation: Secondary | ICD-10-CM

## 2019-12-26 DIAGNOSIS — R0789 Other chest pain: Secondary | ICD-10-CM | POA: Diagnosis present

## 2019-12-26 DIAGNOSIS — I69354 Hemiplegia and hemiparesis following cerebral infarction affecting left non-dominant side: Secondary | ICD-10-CM | POA: Diagnosis not present

## 2019-12-26 DIAGNOSIS — G8929 Other chronic pain: Secondary | ICD-10-CM | POA: Diagnosis present

## 2019-12-26 DIAGNOSIS — I130 Hypertensive heart and chronic kidney disease with heart failure and stage 1 through stage 4 chronic kidney disease, or unspecified chronic kidney disease: Secondary | ICD-10-CM | POA: Diagnosis present

## 2019-12-26 DIAGNOSIS — E785 Hyperlipidemia, unspecified: Secondary | ICD-10-CM | POA: Diagnosis present

## 2019-12-26 DIAGNOSIS — R570 Cardiogenic shock: Secondary | ICD-10-CM | POA: Diagnosis not present

## 2019-12-26 DIAGNOSIS — Z7189 Other specified counseling: Secondary | ICD-10-CM | POA: Diagnosis not present

## 2019-12-26 DIAGNOSIS — I5043 Acute on chronic combined systolic (congestive) and diastolic (congestive) heart failure: Secondary | ICD-10-CM | POA: Diagnosis not present

## 2019-12-26 DIAGNOSIS — K8010 Calculus of gallbladder with chronic cholecystitis without obstruction: Secondary | ICD-10-CM | POA: Diagnosis present

## 2019-12-26 DIAGNOSIS — R1310 Dysphagia, unspecified: Secondary | ICD-10-CM | POA: Diagnosis present

## 2019-12-26 DIAGNOSIS — R251 Tremor, unspecified: Secondary | ICD-10-CM | POA: Diagnosis not present

## 2019-12-26 DIAGNOSIS — I420 Dilated cardiomyopathy: Secondary | ICD-10-CM | POA: Diagnosis present

## 2019-12-26 DIAGNOSIS — Z20822 Contact with and (suspected) exposure to covid-19: Secondary | ICD-10-CM | POA: Diagnosis present

## 2019-12-26 DIAGNOSIS — R531 Weakness: Secondary | ICD-10-CM | POA: Diagnosis not present

## 2019-12-26 DIAGNOSIS — I5023 Acute on chronic systolic (congestive) heart failure: Secondary | ICD-10-CM | POA: Diagnosis present

## 2019-12-26 DIAGNOSIS — N179 Acute kidney failure, unspecified: Secondary | ICD-10-CM | POA: Diagnosis not present

## 2019-12-26 DIAGNOSIS — I639 Cerebral infarction, unspecified: Secondary | ICD-10-CM | POA: Diagnosis not present

## 2019-12-26 DIAGNOSIS — D509 Iron deficiency anemia, unspecified: Secondary | ICD-10-CM | POA: Diagnosis present

## 2019-12-26 DIAGNOSIS — B182 Chronic viral hepatitis C: Secondary | ICD-10-CM | POA: Diagnosis present

## 2019-12-26 DIAGNOSIS — R7989 Other specified abnormal findings of blood chemistry: Secondary | ICD-10-CM | POA: Diagnosis present

## 2019-12-26 DIAGNOSIS — R911 Solitary pulmonary nodule: Secondary | ICD-10-CM | POA: Diagnosis present

## 2019-12-26 DIAGNOSIS — I69391 Dysphagia following cerebral infarction: Secondary | ICD-10-CM | POA: Diagnosis not present

## 2019-12-26 DIAGNOSIS — J449 Chronic obstructive pulmonary disease, unspecified: Secondary | ICD-10-CM | POA: Diagnosis present

## 2019-12-26 DIAGNOSIS — Z515 Encounter for palliative care: Secondary | ICD-10-CM | POA: Diagnosis not present

## 2019-12-26 DIAGNOSIS — F419 Anxiety disorder, unspecified: Secondary | ICD-10-CM | POA: Diagnosis present

## 2019-12-26 LAB — BASIC METABOLIC PANEL
Anion gap: 13 (ref 5–15)
Anion gap: 9 (ref 5–15)
BUN: 13 mg/dL (ref 8–23)
BUN: 14 mg/dL (ref 8–23)
CO2: 23 mmol/L (ref 22–32)
CO2: 26 mmol/L (ref 22–32)
Calcium: 8.9 mg/dL (ref 8.9–10.3)
Calcium: 9.2 mg/dL (ref 8.9–10.3)
Chloride: 103 mmol/L (ref 98–111)
Chloride: 103 mmol/L (ref 98–111)
Creatinine, Ser: 1.7 mg/dL — ABNORMAL HIGH (ref 0.61–1.24)
Creatinine, Ser: 1.95 mg/dL — ABNORMAL HIGH (ref 0.61–1.24)
GFR, Estimated: 44 mL/min — ABNORMAL LOW (ref >60)
GFR, Estimated: 37 mL/min — ABNORMAL LOW (ref >60)
Glucose, Bld: 118 mg/dL — ABNORMAL HIGH (ref 70–99)
Glucose, Bld: 111 mg/dL — ABNORMAL HIGH (ref 70–99)
Potassium: 4.3 mmol/L (ref 3.5–5.1)
Potassium: 4.1 mmol/L (ref 3.5–5.1)
Sodium: 139 mmol/L (ref 135–145)
Sodium: 138 mmol/L (ref 135–145)

## 2019-12-26 LAB — CBC
HCT: 29.9 % — ABNORMAL LOW (ref 39.0–52.0)
Hemoglobin: 9.2 g/dL — ABNORMAL LOW (ref 13.0–17.0)
MCH: 28.4 pg (ref 26.0–34.0)
MCHC: 30.8 g/dL (ref 30.0–36.0)
MCV: 92.3 fL (ref 80.0–100.0)
Platelets: 204 10*3/uL (ref 150–400)
RBC: 3.24 MIL/uL — ABNORMAL LOW (ref 4.22–5.81)
RDW: 15.6 % — ABNORMAL HIGH (ref 11.5–15.5)
WBC: 5.6 10*3/uL (ref 4.0–10.5)
nRBC: 0 % (ref 0.0–0.2)

## 2019-12-26 LAB — LIPID PANEL
Cholesterol: 103 mg/dL (ref 0–200)
HDL: 26 mg/dL — ABNORMAL LOW (ref >40)
LDL Cholesterol: 63 mg/dL (ref 0–99)
Total CHOL/HDL Ratio: 4 RATIO
Triglycerides: 68 mg/dL (ref <150)
VLDL: 14 mg/dL (ref 0–40)

## 2019-12-26 LAB — IRON AND TIBC
Iron: 47 ug/dL (ref 45–182)
Saturation Ratios: 12 % — ABNORMAL LOW (ref 17.9–39.5)
TIBC: 403 ug/dL (ref 250–450)
UIBC: 356 ug/dL

## 2019-12-26 LAB — HEMOGLOBIN A1C
Hgb A1c MFr Bld: 4.6 % — ABNORMAL LOW (ref 4.8–5.6)
Mean Plasma Glucose: 85.32 mg/dL

## 2019-12-26 LAB — DIGOXIN LEVEL: Digoxin Level: 0.5 ng/mL — ABNORMAL LOW (ref 1.0–2.0)

## 2019-12-26 LAB — HIV ANTIBODY (ROUTINE TESTING W REFLEX): HIV Screen 4th Generation wRfx: NONREACTIVE

## 2019-12-26 LAB — FERRITIN: Ferritin: 31 ng/mL (ref 24–336)

## 2019-12-26 LAB — TROPONIN I (HIGH SENSITIVITY)
Troponin I (High Sensitivity): 402 ng/L (ref <18)
Troponin I (High Sensitivity): 382 ng/L (ref <18)

## 2019-12-26 LAB — VITAMIN B12: Vitamin B-12: 386 pg/mL (ref 180–914)

## 2019-12-26 LAB — RETICULOCYTES
Immature Retic Fract: 33.3 % — ABNORMAL HIGH (ref 2.3–15.9)
RBC.: 3.22 MIL/uL — ABNORMAL LOW (ref 4.22–5.81)
Retic Count, Absolute: 98.9 10*3/uL (ref 19.0–186.0)
Retic Ct Pct: 3.1 % (ref 0.4–3.1)

## 2019-12-26 LAB — FOLATE: Folate: 19.6 ng/mL (ref >5.9)

## 2019-12-26 MED ORDER — ATORVASTATIN CALCIUM 10 MG PO TABS: 40.0000 mg | ORAL_TABLET | Freq: Every evening | ORAL | Status: DC

## 2019-12-26 MED ORDER — FERROUS SULFATE 325 (65 FE) MG PO TABS: 325.0000 mg | ORAL_TABLET | Freq: Every day | ORAL | Status: DC

## 2019-12-26 MED ORDER — ATORVASTATIN CALCIUM 10 MG PO TABS
20.0000 mg | ORAL_TABLET | Freq: Every evening | ORAL | Status: DC
  Administered 2019-12-26 – 2019-12-29 (×4): 20 mg via ORAL
  Filled 2019-12-26 (×4): qty 2

## 2019-12-26 NOTE — ED Notes (Signed)
Lab to add Reticulocytes.

## 2019-12-26 NOTE — Progress Notes (Signed)
Family Medicine Teaching Service Daily Progress Note Intern Pager: [phone]  Patient name: Glenn [name] Medical record number: [id number] Date of birth: 03-23-1950 Age: 69 y.o. Gender: male  Primary Care Provider: Gerlene Fee, DO Consultants: Cardiology Code Status: Full  Pt Overview and Major Events to Date:  12/25/2019 - Admitted for dizziness, shortness of breath  Assessment and Plan: Glenn [name] is a 69 y.o. male presenting with shortness of breath and dizziness. PMH is significant for A. fib with RVR on Eliquis, CKD 3, HLD, Large left MCA infarction with hemorrhagic conversion  embolic due to A. fib (July 2018), R MCA CVA (September 2020), ICD (2018), HTN, chronic hep C, tobacco use disorder, and ischemic cardiomyopathy.   Afib RVR  Heart Failure Exacerbation: Patient admitted 11/5 with shortness of breath and dizziness, found to be in A. fib with RVR (for which he is prescribed Eliquis 5 mg twice daily), and exacerbation of his heart failure in the setting of poor diet and medication noncompliance with BNP elevated into 700s and chest x-ray showing pulmonary edema.  Patient is doing well this morning, breathing well on room air.  Remains on IV amiodarone started by cardiology, also on IV Lasix 80 mg twice daily.  Unfortunately I's and O's have not been charted.  Lipid panel unchanged from previous value, HbA1c low. -Cardiology consulted, to see patient today, appreciate recommendations -Continuous cardiac, pulse ox monitoring -Continue IV amiodarone, Eliquis 5 mg twice daily, IV Lasix 80 mg twice daily  -K this AM 3.5>4.3, monitor potassium while on IV Lasix -monitor ins and outs -Continue home digoxin 0.125 mg daily, Ranexa 500 mg twice daily -Follow-up digoxin levels -Patient n.p.o. for now pending any intervention from cardiology, otherwise can have heart-healthy diet  Hypertension, chronic, stable: BP this morning 115/88 -Continue amlodipine 5 mg  CKD 3B:   Patient's GFR on admission 44, creatinine stable and at baseline -Avoid nephrotoxic medications -Monitor creatinine withCMP  HLD: Patient has a history of 2 prior CVAs (2018, 2020).  Patient currently prescribed atorvastatin 20 mg, lipid panel within normal limits except for low HDL. -Continue atorvastatin 20 mg daily -Monitor for statin intolerance  Chronic cholecystitis  cholelithiasis with JP drain: Patient with history of cholecystitis for IR placed a biliary drain in 2020.  Due to patient's significant heart history is a poor surgical candidate for cholecystectomy.  Patient was seen in October 2021 in the emergency department when his drain got "ripped out".  Patient keeps the ostomy covered with gauze.   -Consider contacting general surgery to readdress this drainage (patient has appointment to follow-up for this in 2 weeks)  Chronic normocytic anemia Patient's hemoglobin on admission 9.2. Pt endorses fatigue. -Started patient on ferrous sulfate 325 mg daily -AM CBC shows hemoglobin of 9.2 and MCV of 92.3  Depression Anxiety, chronic, stable -Continue Zoloft 25 mg Daily -Monitor mood while hospitalized  COPD Patient with normal work of breathing, satting well on room air.  Home medications include albuterol nebulizer Q6H PRN for SOB/wheezing -Will hold albuterol for now in the setting of tachycardia -Continue albuterol once heart rate within normal  GERD  Patient on home Protonix 40 mg daily.  -continue home Protonix  FEN/GI:  N.p.o. pending any cardiology intervention Prophylaxis: Eliquis 5 mg twice daily  Disposition: Admit to cardiac telemetry  Subjective:  Patient seen this morning resting in bed, has changed rooms in the emergency department.  Is feeling better.  Objective: Temp:  [98.6 F (37 C)] 98.6 F (37 C) (  11/05 1704) Pulse Rate:  [26-144] 120 (11/06 0600) Resp:  [20-35] 24 (11/06 0600) BP: (107-159)/(69-130) 115/88 (11/06 0600) SpO2:  [90  %-100 %] 95 % (11/06 0600) Weight:  [75.8 kg] 75.8 kg (11/05 2215) Physical Exam: General: Pleasant patient, no apparent distress Cardiovascular: Remains in A. fib Respiratory: Moving air well, comfortable work of breathing  Laboratory: Recent Labs  Lab 12/25/19 1757 12/26/19 0428  WBC 5.3 5.6  HGB 9.2* 9.2*  HCT 30.0* 29.9*  PLT 195 204   Recent Labs  Lab 12/25/19 1757 12/26/19 0438  NA 139 139  K 3.5 4.3  CL 105 103  CO2 22 23  BUN 11 13  CREATININE 1.69* 1.70*  CALCIUM 8.8* 8.9  GLUCOSE 105* 118*    Imaging/Diagnostic Tests: CXR 12/25/19: Cardiomegaly with moderate pulmonary edema.   Glenn Floro, DO 12/26/2019, 6:03 AM PGY-3, [place] Intern pager: [phone], text pages welcome

## 2019-12-26 NOTE — Progress Notes (Signed)
Progress Note  Patient Name: Glenn [name] Date of Encounter: 12/26/2019  [facility] Cardiologist: Virl Axe, MD /Dr Bensimhon  Subjective   Some improvement in SOB  Inpatient Medications    Scheduled Meds: . amLODipine  5 mg Oral QHS  . apixaban  5 mg Oral BID  . atorvastatin  20 mg Oral QPM  . digoxin  0.0625 mg Oral Daily  . furosemide  80 mg Intravenous BID  . gabapentin  600 mg Oral TID  . hydrOXYzine  25 mg Oral QHS  . pantoprazole  40 mg Oral Daily  . potassium chloride  40 mEq Oral Daily  . ranolazine  500 mg Oral BID  . sertraline  25 mg Oral Daily   Continuous Infusions: . amiodarone 30 mg/hr (12/26/19 1042)   PRN Meds: acetaminophen, calcium carbonate, ondansetron (ZOFRAN) IV   Vital Signs    Vitals:   12/26/19 0945 12/26/19 1030 12/26/19 1230 12/26/19 1246  BP: (!) 144/98 (!) 119/95 114/80 (!) 135/97  Pulse:  100 (!) 114 69  Resp: (!) 34 (!) 23 (!) 28 17  Temp:    98 F (36.7 C)  TempSrc:    Oral  SpO2:  99% 99% 99%  Weight:      Height:        Intake/Output Summary (Last 24 hours) at 12/26/2019 1257 Last data filed at 12/26/2019 1251 Gross per 24 hour  Intake 200 ml  Output 600 ml  Net -400 ml   Last 3 Weights 12/25/2019 12/01/2019 11/13/2019  Weight (lbs) 167 lb 165 lb 163 lb  Weight (kg) 75.751 kg 74.844 kg 73.936 kg      Telemetry    afib 90s-110s - Personally Reviewed  ECG    n/a - Personally Reviewed  Physical Exam   GEN: No acute distress.   Neck: elevated JVD Cardiac: irreg, rate 993, 3/6 sysotlic murmur apex Respiratory: Crackles bilateral bases GI: Soft, nontender, non-distended  MS: No edema; No deformity. Neuro:  Nonfocal  Psych: Normal affect   Labs    High Sensitivity Troponin:   Recent Labs  Lab 12/25/19 1757 12/25/19 2006 12/26/19 0044 12/26/19 0438  TROPONINIHS 497* 440* 402* 382*      Chemistry Recent Labs  Lab 12/25/19 1757 12/26/19 0438  NA 139 139  K 3.5 4.3  CL 105 103  CO2  22 23  GLUCOSE 105* 118*  BUN 11 13  CREATININE 1.69* 1.70*  CALCIUM 8.8* 8.9  GFRNONAA 44* 44*  ANIONGAP 12 13     Hematology Recent Labs  Lab 12/25/19 1757 12/26/19 0428  WBC 5.3 5.6  RBC 3.25* 3.24*  3.22*  HGB 9.2* 9.2*  HCT 30.0* 29.9*  MCV 92.3 92.3  MCH 28.3 28.4  MCHC 30.7 30.8  RDW 15.3 15.6*  PLT 195 204    BNP Recent Labs  Lab 12/25/19 1757  BNP 776.7*     DDimer No results for input(s): DDIMER in the last 168 hours.   Radiology    DG Chest Port 1 View  Result Date: 12/25/2019 CLINICAL DATA:  Shortness of breath.  Atrial fibrillation with RVR. EXAM: PORTABLE CHEST 1 VIEW COMPARISON:  07/01/2019 FINDINGS: Left-sided pacemaker in place. Cardiomegaly with moderate pulmonary edema. Unchanged mediastinal contours. No definite pleural effusion. No focal airspace disease. No pneumothorax. Chronic remote right rib fracture. No acute osseous abnormalities are seen. IMPRESSION: Cardiomegaly with moderate pulmonary edema. Electronically Signed   By: Demetry Rake M.D.   On: 12/25/2019 18:09  Cardiac Studies   ECHO: 06/30/2019 1. Left ventricular ejection fraction, by estimation, is 20 to 25%. The  left ventricle has severely decreased function. The left ventricle  demonstrates global hypokinesis. The left ventricular internal cavity size  was moderately dilated.  2. Right ventricular systolic function is moderately reduced. The right  ventricular size is moderately enlarged.  3. Left atrial size was severely dilated. No left atrial/left atrial  appendage thrombus was detected.  4. Right atrial size was moderately dilated.  5. The mitral valve is normal in structure. Trivial mitral valve  regurgitation.  6. Tricuspid valve regurgitation is severe.  7. The aortic valve is normal in structure. Aortic valve regurgitation is  moderate.   CATH: R heart cath 06/24/2019 RA = 19 RV = 49/16 PA = 54/17 (34) PCW = 27 Fick cardiac output/index =  3.8/2.0 Thermo CO/CI = 4.7/2.5 PVR = 1.8 (Fick) FA sat = 99% PA sat = 54%, 60%  Assessment:  1. Low output HF biventricular failure and marked volume overload  Plan/Discussion:  Start milrinone. Diuresis. Move to ICU for hemodynamic monitoring. Can upgrade to mechanical support as needed.   R/L CARDIAC CATH: 11/03/2018 IMPRESSION:Mr. Burgeson has clean coronary arteries, elevated filling pressures consistent with a nonischemic cardiomyopathy with acute on chronic systolic heart failure. He is in A. fib with RVR. He will need pharmacologic optimization and initiation of diuretic therapy for LVEDP and elevated filling pressures. He already has an ICD in place. The sheath was removed and a TR band was placed on the right wrist to achieve patent hemostasis. The Swan-Ganz catheter was removed as well as the antecubital sheath. The patient left lab in stable condition. Dr. Fransico Him was notified of these results.  Patient Profile     Glenn [name] is a 69 y.o. male with a hx of chronic systolic HF 2/2 NICM s/p BSci ICD, atrial fibrillation, h/o stroke (Lg L MCA infarct w/ hemorrhagic conversionin 2018), h/o DVT, chornic a/c therapy, HTN, HLD, pulmonary nodule, Hepatitis C, carpal tunnel, tobacco abuse (smokes ~1/4 ppd) and chronic cholecystitis with a chronic perc drain, who is being seen today for the evaluation of Afib RVR and CHF at the request of Dr Regenia Skeeter.  Hospitalized 05/04/-07/16/2019 with CGS requiring milrinone and IABP, was in Afib at that time, s/p TEE/DCCV.   No office notes seen in Epic since then.  Assessment & Plan    1. Afib with RVR - started on IV amio - prior TEE/DCCV 06/2019 - has missed some eliquis doses, would required TEE prior to repeat DCCV.  - had been on amiio 200mg  tid along with ranexa, digoxin. No beta blocker due to low output HF. - digoxin level pending - he reports ran out of amio 1 week ago. Was only taking twice a day he  thinks instead of 3 times a day. He seems overlal unsure of his regimen, hard to classify this as treatment failure as opposed to noncompliance - rates inmproved on IV amio, continue to load. Consider TEE/DCCV Monday and plan to continue home oral amio 200mg  tid and ranexa. Encouraged increased compliance with all meds.   2. Acute on chronic systolic HF - 76/7209 echo LVEF 20%, mild RV dysfucntion - history of NICM according to notes - [facility] 10/2018 showed normal cors, elevated filling pressures and low CI at 1.9  - admit 06/2019 with severe HF, required IABP and milrinone. - has not been on beta blockers due to low output HF - with renal dysfunction  has not been on ACE/ARB/ARNI - from CHF team note poor LVAD candidate with RV dysfunction, noncompliance and renal failure.  - this admit BNP 777, CXR mod pulm edema - limited I/Os data thus far. Received IV lasix 40mg  bid yesterday, increased to 80mg  bid today it appears. Stable Cr from admission (baseline around 1.7)   3. CKD III - baselien Cr looks to be around 1.6 to 1.7 - admission Cr 1.69, stable at 1.7 today  4. Elevated troponin - 10/2018 cath clean coronaries - elevated trops due to afib with RVR, CHF   For questions or updates, please contact [place] Please consult [email or web address] for contact info under        Signed, Carlyle Dolly, MD  12/26/2019, 12:57 PM

## 2019-12-26 NOTE — ED Notes (Signed)
Lab called to add Trop. And BMP

## 2019-12-27 ENCOUNTER — Inpatient Hospital Stay (HOSPITAL_COMMUNITY): Payer: Medicare HMO

## 2019-12-27 ENCOUNTER — Inpatient Hospital Stay: Payer: Self-pay

## 2019-12-27 DIAGNOSIS — R7989 Other specified abnormal findings of blood chemistry: Secondary | ICD-10-CM | POA: Diagnosis not present

## 2019-12-27 DIAGNOSIS — N1832 Chronic kidney disease, stage 3b: Secondary | ICD-10-CM | POA: Diagnosis not present

## 2019-12-27 DIAGNOSIS — I4819 Other persistent atrial fibrillation: Secondary | ICD-10-CM | POA: Diagnosis not present

## 2019-12-27 DIAGNOSIS — I5023 Acute on chronic systolic (congestive) heart failure: Secondary | ICD-10-CM

## 2019-12-27 LAB — BASIC METABOLIC PANEL
Anion gap: 13 (ref 5–15)
BUN: 18 mg/dL (ref 8–23)
CO2: 24 mmol/L (ref 22–32)
Calcium: 8.9 mg/dL (ref 8.9–10.3)
Chloride: 102 mmol/L (ref 98–111)
Creatinine, Ser: 2.1 mg/dL — ABNORMAL HIGH (ref 0.61–1.24)
GFR, Estimated: 34 mL/min — ABNORMAL LOW (ref >60)
Glucose, Bld: 100 mg/dL — ABNORMAL HIGH (ref 70–99)
Potassium: 3.6 mmol/L (ref 3.5–5.1)
Sodium: 139 mmol/L (ref 135–145)

## 2019-12-27 LAB — CBC
HCT: 28.7 % — ABNORMAL LOW (ref 39.0–52.0)
Hemoglobin: 9.2 g/dL — ABNORMAL LOW (ref 13.0–17.0)
MCH: 28.7 pg (ref 26.0–34.0)
MCHC: 32.1 g/dL (ref 30.0–36.0)
MCV: 89.4 fL (ref 80.0–100.0)
Platelets: 199 10*3/uL (ref 150–400)
RBC: 3.21 MIL/uL — ABNORMAL LOW (ref 4.22–5.81)
RDW: 15.6 % — ABNORMAL HIGH (ref 11.5–15.5)
WBC: 5 10*3/uL (ref 4.0–10.5)
nRBC: 0 % (ref 0.0–0.2)

## 2019-12-27 LAB — COOXEMETRY PANEL
Carboxyhemoglobin: 1.1 % (ref 0.5–1.5)
Methemoglobin: 0.6 % (ref 0.0–1.5)
O2 Saturation: 31.3 %
Total hemoglobin: 9.5 g/dL — ABNORMAL LOW (ref 12.0–16.0)

## 2019-12-27 MED ORDER — AMIODARONE LOAD VIA INFUSION
150.0000 mg | Freq: Once | INTRAVENOUS | Status: AC
  Administered 2019-12-27: 150 mg via INTRAVENOUS
  Filled 2019-12-27: qty 83.34

## 2019-12-27 MED ORDER — MILRINONE LACTATE IN DEXTROSE 20-5 MG/100ML-% IV SOLN
0.2500 ug/kg/min | INTRAVENOUS | Status: DC
  Administered 2019-12-27: 0.25 ug/kg/min via INTRAVENOUS
  Filled 2019-12-27: qty 100

## 2019-12-27 MED ORDER — HYDROXYZINE HCL 25 MG PO TABS: 25.0000 mg | ORAL_TABLET | Freq: Four times a day (QID) | ORAL | Status: DC | PRN

## 2019-12-27 MED ORDER — SODIUM CHLORIDE 0.9% FLUSH: 10.0000 mL | INTRAVENOUS | Status: DC | PRN

## 2019-12-27 MED ORDER — SODIUM CHLORIDE 0.9% FLUSH
10.0000 mL | Freq: Two times a day (BID) | INTRAVENOUS | Status: DC
  Administered 2019-12-27 – 2020-01-04 (×11): 10 mL
  Administered 2020-01-04: 40 mL
  Administered 2020-01-05 – 2020-01-17 (×20): 10 mL

## 2019-12-27 MED ORDER — GABAPENTIN 300 MG PO CAPS
300.0000 mg | ORAL_CAPSULE | Freq: Two times a day (BID) | ORAL | Status: DC
  Administered 2019-12-27 – 2019-12-31 (×8): 300 mg via ORAL
  Filled 2019-12-27 (×8): qty 1

## 2019-12-27 MED ORDER — POTASSIUM CHLORIDE CRYS ER 20 MEQ PO TBCR
40.0000 meq | EXTENDED_RELEASE_TABLET | Freq: Once | ORAL | Status: AC
  Administered 2019-12-27: 40 meq via ORAL
  Filled 2019-12-27: qty 2

## 2019-12-27 MED ORDER — METOLAZONE 5 MG PO TABS
2.5000 mg | ORAL_TABLET | Freq: Once | ORAL | Status: AC
  Administered 2019-12-27: 2.5 mg via ORAL
  Filled 2019-12-27: qty 1

## 2019-12-27 MED ORDER — CHLORHEXIDINE GLUCONATE CLOTH 2 % EX PADS
6.0000 | MEDICATED_PAD | Freq: Every day | CUTANEOUS | Status: DC
  Administered 2019-12-28 – 2020-01-15 (×17): 6 via TOPICAL

## 2019-12-27 NOTE — Progress Notes (Signed)
FPTS Interim Progress Note  Nurse messaged that patient having anxiety.  Went to see patient but resting comfortably in bed.  Indicated was feeling a little anxious and short of breath.  Examined and heart and lungs sound similar to previous exam.  Indicated got Vistaril last night which helped fro similar previous episode last night. - Vistaril 25 mg q6hr  Delora Fuel, MD 12/27/2019, 6:54 PM PGY-1, [place] Medicine Service pager [phone]

## 2019-12-27 NOTE — Progress Notes (Signed)
Peripherally Inserted Central Catheter Placement  The IV Nurse has discussed with the patient and/or persons authorized to consent for the patient, the purpose of this procedure and the potential benefits and risks involved with this procedure.  The benefits include less needle sticks, lab draws from the catheter, and the patient may be discharged home with the catheter. Risks include, but not limited to, infection, bleeding, blood clot (thrombus formation), and puncture of an artery; nerve damage and irregular heartbeat and possibility to perform a PICC exchange if needed/ordered by physician.  Alternatives to this procedure were also discussed.  Bard Power PICC patient education guide, fact sheet on infection prevention and patient information card has been provided to patient /or left at bedside.    PICC Placement Documentation  PICC Double Lumen 12/27/19 PICC Right Brachial 39 cm 0 cm (Active)  Indication for Insertion or Continuance of Line Vasoactive infusions 12/27/19 1508  Exposed Catheter (cm) 0 cm 12/27/19 1508  Site Assessment Clean;Dry;Intact 12/27/19 1508  Lumen #1 Status Flushed;Saline locked;Blood return noted 12/27/19 1508  Lumen #2 Status Flushed;Saline locked;Blood return noted 12/27/19 1508  Dressing Type Transparent 12/27/19 1508  Dressing Status Clean;Dry;Intact 12/27/19 1508  Antimicrobial disc in place? Yes 12/27/19 1508  Dressing Intervention New dressing 12/27/19 1508  Dressing Change Due 12/27/19 12/27/19 1508       Gordan Payment 12/27/2019, 3:11 PM

## 2019-12-27 NOTE — Evaluation (Signed)
Physical Therapy Evaluation Patient Details Name: Glenn [name] MRN: [id number] DOB: December 26, 1950 Today's Date: 12/27/2019   History of Present Illness  Glenn [name] is a 69 y.o. male with a hx of chronic systolic HF 2/2 NICM s/p BSci ICD, atrial fibrillation, h/o stroke (Lg L MCA infarct w/ hemorrhagic conversion in 2018), h/o DVT, chornic a/c therapy, HTN, HLD, pulmonary nodule, Hepatitis C, carpal tunnel,  tobacco abuse (smokes ~1/4 ppd) and chronic cholecystitis with a chronic perc drain, who is being seen today for the evaluation of Afib RVR and CHF at the request of Dr Regenia Skeeter.  Clinical Impression  Prior to admission, pt lives with his son and is independent with mobility using a cane. Pt presents fairly close to his functional baseline, ambulating 200 feet with no assistive device at a supervision level. HR 110-139 bpm, SpO2 98% on RA. Presents with decreased cardiopulmonary endurance and has mild left sided residual deficits from prior stroke. Will continue to follow acutely to progress mobility as tolerated. Don't anticipate need for PT follow up.    Follow Up Recommendations No PT follow up;Supervision - Intermittent    Equipment Recommendations  None recommended by PT    Recommendations for Other Services       Precautions / Restrictions Precautions Precautions: Fall;Other (comment) Precaution Comments: watch HR Restrictions Weight Bearing Restrictions: No      Mobility  Bed Mobility Overal bed mobility: Independent                  Transfers Overall transfer level: Independent Equipment used: None                Ambulation/Gait Ambulation/Gait assistance: Supervision Gait Distance (Feet): 200 Feet Assistive device: None Gait Pattern/deviations: Step-through pattern;Decreased stride length;Decreased dorsiflexion - left;Decreased step length - left Gait velocity: decreased   General Gait Details: Pt overall requiring supervision for  safety, decreased left foot clearance and heel strike when fatigued. Decreased L arm swing noted as well  Stairs            Wheelchair Mobility    Modified Rankin (Stroke Patients Only)       Balance Overall balance assessment: Mild deficits observed, not formally tested                                           Pertinent Vitals/Pain Pain Assessment: No/denies pain    Home Living Family/patient expects to be discharged to:: Private residence Living Arrangements: Children (son) Available Help at Discharge: Family;Available PRN/intermittently Type of Home: Apartment Home Access: Level entry     Home Layout: One level Home Equipment: Cane - single point;Walker - standard;Bedside commode      Prior Function Level of Independence: Needs assistance   Gait / Transfers Assistance Needed: using cane  ADL's / Homemaking Assistance Needed: independent ADL's, son assists with IADL's        Hand Dominance        Extremity/Trunk Assessment   Upper Extremity Assessment Upper Extremity Assessment: Defer to OT evaluation    Lower Extremity Assessment Lower Extremity Assessment: RLE deficits/detail;LLE deficits/detail RLE Deficits / Details: Strength 5/5 LLE Deficits / Details: Residual deficits from prior stroke. Grossly 4/5    Cervical / Trunk Assessment Cervical / Trunk Assessment: Normal  Communication   Communication: No difficulties  Cognition Arousal/Alertness: Awake/alert Behavior During Therapy: WFL for tasks assessed/performed Overall Cognitive Status:  Within Functional Limits for tasks assessed                                        General Comments      Exercises     Assessment/Plan    PT Assessment Patient needs continued PT services  PT Problem List Decreased strength;Decreased activity tolerance;Decreased balance;Decreased mobility       PT Treatment Interventions Gait training;Stair training;Functional  mobility training;Therapeutic activities;Therapeutic exercise;Balance training;Patient/family education    PT Goals (Current goals can be found in the Care Plan section)  Acute Rehab PT Goals Patient Stated Goal: 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) PT Goal Formulation: With patient Time For Goal Achievement: 01/10/20 Potential to Achieve Goals: Good    Frequency Min 3X/week   Barriers to discharge        Co-evaluation               AM-PAC PT "6 Clicks" Mobility  Outcome Measure Help needed turning from your back to your side while in a flat bed without using bedrails?: None Help needed moving from lying on your back to sitting on the side of a flat bed without using bedrails?: None Help needed moving to and from a bed to a chair (including a wheelchair)?: None Help needed standing up from a chair using your arms (e.g., wheelchair or bedside chair)?: None Help needed to walk in hospital room?: None Help needed climbing 3-5 steps with a railing? : A Little 6 Click Score: 23    End of Session   Activity Tolerance: Patient tolerated treatment well Patient left: in bed;with call bell/phone within reach Nurse Communication: Mobility status PT Visit Diagnosis: Difficulty in walking, not elsewhere classified (R26.2)    Time: 1610-9604 PT Time Calculation (min) (ACUTE ONLY): 15 min   Charges:   PT Evaluation $PT Eval Moderate Complexity: 1 Mod          Wyona Almas, PT, DPT Acute Rehabilitation Services Pager [phone] Office [phone]   Deno Etienne 12/27/2019, 12:12 PM

## 2019-12-27 NOTE — Progress Notes (Signed)
Family Medicine Teaching Service Daily Progress Note Intern Pager: [phone]  Patient name: Glenn [name] Medical record number: [id number] Date of birth: 03-05-1950 Age: 69 y.o. Gender: male  Primary Care Provider: Gerlene Fee, DO Consultants: Cardiology Code Status: Full  Pt Overview and Major Events to Date:  Admitted 11/5  Assessment and Plan: Glenn Yang Dejournetteis a 69 y.o.malepresenting with shortness of breath and dizziness. PMH is significant forA. fib with RVR on Eliquis, CKD 3, HLD, Large left MCA infarction with hemorrhagic conversion embolic due to A. fib (July 2018), RMCA CVA (September 2020), ICD (2018), HTN, chronic hep C, tobacco use disorder, and ischemic cardiomyopathy.    Afib RVR Heart Failure Exacerbation Patient continues to be mildly tachycardic.  Cardioloigy following   - Cardiology following, dosing amiodarone, appreciate recs - TEE scheduled for tomorrow -Continuous cardiac, pulse ox monitoring -Continue IV amiodarone, Eliquis 5 mg twice daily, IV Lasix 80 mg twice daily   Hypertension, chronic, stable: BP this morning 131/95 -Continue amlodipine 5 mg  CKD 3B:  Patient's GFR on admission 44, creatinine initially 1.7 but has been gradually increasing to 2.1.  Getting Lasix IV 80 mg BID.   Will avoid giving fluids given severe HFrEF.   -Avoid nephrotoxic medications - Cardiology reccomending holding Digoxin -Monitor creatinine withBMP  HLD: Patient has a history of 2 prior CVAs (2018, 2020). Patient currently prescribed atorvastatin 20 mg, lipid panel within normal limits except for low HDL. -Continue atorvastatin 20 mg daily -Monitor for statin intolerance  Chronic cholecystitischolelithiasis with JP drain: Patient with history of cholecystitis for IR placed a biliary drain in 2020. Due to patient's significant heart history is a poor surgical candidate for cholecystectomy. Patient was seen in October 2021 in the emergency  department when his drain got "ripped out". Patient keepsthe ostomy covered with gauze.  -Will contact general surgery to readdress this drainage (patient has appointment to follow-up for this in 2 weeks)  Chronic normocytic anemia Patient's hemoglobin on admission 9.2.  Has been stable throughout.   -Ferrous sulfate 325 mg daily -AMCBC   Depression Anxiety, chronic, stable -Continue Zoloft 25 mg Daily -Monitor mood while hospitalized  COPD Patient with normal work of breathing, satting well on room air.  Home medications include albuterol nebulizer Q6H PRN for SOB/wheezing -Will hold albuterol for now in the setting of tachycardia -Continue albuterol once heart rate within normal  GERD  Patient on home Protonix 40 mg daily.  -continue home Protonix  FEN/GI: N.p.o. pending any cardiology intervention Prophylaxis:Eliquis 5 mg twice daily  Status is: Inpatient  Remains inpatient appropriate because:Ongoing diagnostic testing needed not appropriate for outpatient work up   Dispo: The patient is from: Home              Anticipated d/c is to: Home              Anticipated d/c date is: 2 days              Patient currently is not medically stable to d/c.    Subjective:  Patient currently feeling well.  Denies any chest pain or shortness of breath. Indicates Cardiology has been seeing and updating him every day.  Objective: Temp:  [98 F (36.7 C)-99.2 F (37.3 C)] 98 F (36.7 C) (11/07 0500) Pulse Rate:  [56-107] 98 (11/07 0800) Resp:  [17-20] 17 (11/07 0500) BP: (108-131)/(75-95) 131/95 (11/07 0500) SpO2:  [94 %-100 %] 97 % (11/07 0500) Weight:  [77 kg] 77 kg (11/07 0500) Physical Exam:  Physical Exam Constitutional:      General: He is not in acute distress.    Appearance: He is not ill-appearing.  HENT:     Head: Normocephalic and atraumatic.  Cardiovascular:     Rate and Rhythm: Tachycardia present. Rhythm irregular.     Heart sounds: Normal heart  sounds.  Pulmonary:     Effort: Pulmonary effort is normal. No tachypnea or accessory muscle usage.     Breath sounds: Normal breath sounds.  Skin:    General: Skin is warm.  Neurological:     General: No focal deficit present.     Mental Status: He is alert.    Laboratory: Recent Labs  Lab 12/25/19 1757 12/26/19 0428 12/27/19 0429  WBC 5.3 5.6 5.0  HGB 9.2* 9.2* 9.2*  HCT 30.0* 29.9* 28.7*  PLT 195 204 199   Recent Labs  Lab 12/26/19 0438 12/26/19 1734 12/27/19 0429  NA 139 138 139  K 4.3 4.1 3.6  CL 103 103 102  CO2 23 26 24   BUN 13 14 18   CREATININE 1.70* 1.95* 2.10*  CALCIUM 8.9 9.2 8.9  GLUCOSE 118* 111* 100*     Imaging/Diagnostic Tests: None  Delora Fuel, MD 12/27/2019, 2:37 PM PGY-1, [place] Intern pager: [phone], text pages welcome

## 2019-12-27 NOTE — Consult Note (Addendum)
Advanced Heart Failure Team Consult Note   Primary Physician: Gerlene Fee, DO PCP-Cardiologist:  Virl Axe, MD  Reason for Consultation: Systolic HF in setting of recurrent AF   HPI:    Glenn [name] is seen today for evaluation of systolic HF in the setting of recurrent AF at the request of Dr. Ouida Sills.  He is a 69 y.o. male with a hx of chronic systolic HF 2/2 NICM s/p BSci ICD, persistent atrial fibrillation, h/o stroke (Lg L MCA infarct w/ hemorrhagic conversionin 2018), HTN, HCV, tobacco abuse (smokes ~1/4 ppd) and chronic cholecystitis with a chronic perc drain  He was hospitalized 05/04/-07/16/2019 with severe cardiogenic shock in setting of persistent AF with RVR requiring milrinone and IABP. Was managed by the HF team and Dr. [name] and failed multiple DC-CVs before holding NSR with high-dose amio and Ranexa.. We were then able to wean inotrope support. Creatinine peaked at 3.0 on discharge was 2.05. GB perc tube place for presumes cholecystitis at the time. EF 20-25% with moderate to severe RV dysfunction.  Had cath 9/20 with no CAD  Discharged to SNF. Never followed up in HF Clinic or with Dr. [name]. Seen in IMTS clinic 7/21 and was stable in NSR.  Readmitted 11/521 with ADHF in setting of recurrent AF with RVR. Says he thinks he had been out for a few days. Missed several doses of Eliquis lest week.   Started on IV amio and IV lasix with modest diuresis. Creatinine 1.7 -> 1.95 -> 2.10.  Still feels sob. Mild orthopnea. No PND. + palpitations     Review of Systems: [y] = yes, [ ]  = no   . General: Weight gain [ ] ; Weight loss [ ] ; Anorexia [ ] ; Fatigue [ y]; Fever [ ] ; Chills [ ] ; Weakness [ ]   . Cardiac: Chest pain/pressure [ ] ; Resting SOB Blue.Reese ]; Exertional SOB Blue.Reese ]; Orthopnea [ ] ; Pedal Edema [ ] ; Palpitations [ y]; Syncope [ ] ; Presyncope [ ] ; Paroxysmal nocturnal dyspnea[ ]   . Pulmonary: Cough [ ] ; Wheezing[ ] ; Hemoptysis[ ] ; Sputum [ ] ; Snoring [ ]    . GI: Vomiting[ ] ; Dysphagia[ ] ; Melena[ ] ; Hematochezia [ ] ; Heartburn[ ] ; Abdominal pain [ ] ; Constipation [ ] ; Diarrhea [ ] ; BRBPR [ ]   . GU: Hematuria[ ] ; Dysuria [ ] ; Nocturia[ ]   . Vascular: Pain in legs with walking [ ] ; Pain in feet with lying flat [ ] ; Non-healing sores [ ] ; Stroke [ ] ; TIA [ ] ; Slurred speech [ ] ;  . Neuro: Headaches[ ] ; Vertigo[ ] ; Seizures[ ] ; Paresthesias[ ] ;Blurred vision [ ] ; Diplopia [ ] ; Vision changes [ ]   . Ortho/Skin: Arthritis Blue.Reese ]; Joint pain [ y]; Muscle pain [ ] ; Joint swelling [ ] ; Back Pain [ ] ; Rash [ ]   . Psych: Depression[ ] ; Anxiety[ ]   . Heme: Bleeding problems [ ] ; Clotting disorders [ ] ; Anemia [ ]   . Endocrine: Diabetes [ ] ; Thyroid dysfunction[ ]   Medications . amLODipine  5 mg Oral QHS  . apixaban  5 mg Oral BID  . atorvastatin  20 mg Oral QPM  . digoxin  0.0625 mg Oral Daily  . furosemide  80 mg Intravenous BID  . gabapentin  600 mg Oral TID  . hydrOXYzine  25 mg Oral QHS  . pantoprazole  40 mg Oral Daily  . potassium chloride  40 mEq Oral Daily  . ranolazine  500 mg Oral BID  . sertraline  25 mg Oral Daily   .  amiodarone 30 mg/hr (12/27/19 0127)     Past Medical History: Past Medical History:  Diagnosis Date  . Acute blood loss anemia   . Acute CVA (cerebrovascular accident) ([place])   . Acute deep vein thrombosis (DVT) of both lower extremities ([name])   . Acute kidney injury ([place])   . Acute on chronic combined systolic and diastolic CHF (congestive heart failure) ([place])   . Acute renal failure superimposed on stage 3a chronic kidney disease ([place])   . Acute respiratory failure with hypoxia ([place])   . Atrial fibrillation ([place])   . Carpal tunnel syndrome of right wrist 02/28/2018  . Cerebral edema ([place]) 11/13/2018  . Cerebral infarction ([place])   . CHF (congestive heart failure) ([place])   . Cholecystitis 02/04/2019  . Chronic right hip pain   . DCM (dilated cardiomyopathy) ([place])   . Dysphagia, post-stroke   . Elevated troponin    . Entrapment of right ulnar nerve 02/28/2018  . Headache due to intracranial disease 11/14/2018  . Hepatitis C   . History of hemorrhagic stroke with residual hemiparesis ([place]) 02/04/2019  . HTN (hypertension) 08/14/2016  . Hyperlipidemia LDL goal <70 11/13/2018  . Hypertension   . ICD (implantable cardioverter-defibrillator) in place 09/13/2016  . Impotence due to erectile dysfunction 09/30/2017  . Ischemic cardiomyopathy   . Labile blood glucose   . Left leg DVT ([place]) 02/04/2019  . Marijuana user 11/13/2018  . Paroxysmal atrial fibrillation ([name])   . Right middle cerebral artery stroke ([place]) 11/13/2018  . Solitary pulmonary nodule 06/10/2017   5 mm RUL nodule noted incidentally as part of CVA workup 08/2016. With smoking history would obtain low-dose CT scan 08/2017.   . Stroke (cerebrum) ([name]) Lg L MCA infarct w/ hemorrhagic conversion, embolic d/t AF 7/82/9562  . Stroke ([place])   . Trochanteric bursitis, right hip 11/14/2018  . Visit for monitoring Tikosyn therapy 03/26/2017    Past Surgical History: Past Surgical History:  Procedure Laterality Date  . CARDIAC DEFIBRILLATOR PLACEMENT  2015  . CARDIOVERSION N/A 10/10/2016   Procedure: CARDIOVERSION;  Surgeon: Dorothy Spark, MD;  Location: [facility];  Service: Cardiovascular;  Laterality: N/A;  . CARDIOVERSION N/A 03/27/2017   Procedure: CARDIOVERSION;  Surgeon: Jerline Pain, MD;  Location: [facility];  Service: Cardiovascular;  Laterality: N/A;  . CARDIOVERSION N/A 10/29/2018   Procedure: CARDIOVERSION;  Surgeon: Sanda Klein, MD;  Location: [facility]OS[facility];  Service: Cardiovascular;  Laterality: N/A;  . CARDIOVERSION N/A 11/05/2018   Procedure: CARDIOVERSION;  Surgeon: Thayer Headings, MD;  Location: [facility] ENDOSCOPY;  Service: Cardiovascular;  Laterality: N/A;  . CARDIOVERSION N/A 07/02/2019   Procedure: CARDIOVERSION;  Surgeon: Jolaine Artist, MD;  Location: [place];  Service: Cardiovascular;  Laterality: N/A;  . CARDIOVERSION N/A  07/14/2019   Procedure: CARDIOVERSION;  Surgeon: Jolaine Artist, MD;  Location: [place];  Service: Cardiovascular;  Laterality: N/A;  . EYE SURGERY Left 1990  . IABP INSERTION N/A 06/26/2019   Procedure: IABP INSERTION;  Surgeon: Jolaine Artist, MD;  Location: [place] CV [facility];  Service: Cardiovascular;  Laterality: N/A;  . IR CATHETER TUBE CHANGE  06/24/2019  . IR EXCHANGE BILIARY DRAIN  02/11/2019  . IR EXCHANGE BILIARY DRAIN  06/09/2019  . IR EXCHANGE BILIARY DRAIN  09/23/2019  . IR IVC FILTER PLMT / S&I /IMG GUID/MOD SED  12/04/2018  . IR PERC CHOLECYSTOSTOMY  12/13/2018  . IR PERCUTANEOUS ART THROMBECTOMY/INFUSION INTRACRANIAL INC DIAG ANGIO  09/05/2016  . IR RADIOLOGIST EVAL & MGMT  10/03/2016  . RADIOLOGY WITH ANESTHESIA N/A 09/05/2016   Procedure: RADIOLOGY WITH ANESTHESIA;  Surgeon: Luanne Bras, MD;  Location: [place];  Service: Radiology;  Laterality: N/A;  . RIGHT HEART CATH N/A 06/24/2019   Procedure: RIGHT HEART CATH;  Surgeon: Jolaine Artist, MD;  Location: [place] CV [facility];  Service: Cardiovascular;  Laterality: N/A;  . RIGHT/LEFT HEART CATH AND CORONARY ANGIOGRAPHY N/A 11/03/2018   Procedure: RIGHT/LEFT HEART CATH AND CORONARY ANGIOGRAPHY;  Surgeon: Lorretta Harp, MD;  Location: [place] [facility];  Service: Cardiovascular;  Laterality: N/A;  . TEE WITHOUT CARDIOVERSION N/A 02/06/2019   Procedure: TRANSESOPHAGEAL ECHOCARDIOGRAM (TEE);  Surgeon: Skeet Latch, MD;  Location: [facility];  Service: Cardiovascular;  Laterality: N/A;  . TEE WITHOUT CARDIOVERSION N/A 06/09/2019   Procedure: TRANSESOPHAGEAL ECHOCARDIOGRAM (TEE);  Surgeon: Buford Dresser, MD;  Location: [facility] ENDOSCOPY;  Service: Cardiovascular;  Laterality: N/A;    Family History: Family History  Problem Relation Age of Onset  . High blood pressure Mother   . High blood pressure Father   . Stroke Maternal Aunt   . Heart disease Neg Hx     Social History: Social History    Socioeconomic History  . Marital status: Single    Spouse name: Not on file  . Number of children: Not on file  . Years of education: 58 (some college)  . Highest education level: Not on file  Occupational History  . Occupation: disability  Tobacco Use  . Smoking status: Former Smoker    Packs/day: 0.50    Types: Cigarettes  . Smokeless tobacco: Never Used  . Tobacco comment: a pack last three days  Vaping Use  . Vaping Use: Never used  Substance and Sexual Activity  . Alcohol use: Not Currently    Alcohol/week: 3.0 standard drinks    Types: 3 Cans of beer per week    Comment: pt stop drinking   . Drug use: Not Currently    Frequency: 2.0 times per week    Types: Marijuana    Comment: stop smoking   . Sexual activity: Yes    Partners: Female    Birth control/protection: Condom  Other Topics Concern  . Not on file  Social History Narrative  . Not on file   Social Determinants of Health   Financial Resource Strain: Low Risk   . Difficulty of Paying Living Expenses: Not very hard  Food Insecurity: No Food Insecurity  . Worried About Charity fundraiser in the Last Year: Never true  . Ran Out of Food in the Last Year: Never true  Transportation Needs: No Transportation Needs  . Lack of Transportation (Medical): No  . Lack of Transportation (Non-Medical): No  Physical Activity:   . Days of Exercise per Week: Not on file  . Minutes of Exercise per Session: Not on file  Stress:   . Feeling of Stress : Not on file  Social Connections:   . Frequency of Communication with Friends and Family: Not on file  . Frequency of Social Gatherings with Friends and Family: Not on file  . Attends Religious Services: Not on file  . Active Member of Clubs or Organizations: Not on file  . Attends Archivist Meetings: Not on file  . Marital Status: Not on file    Allergies:  Allergies  Allergen Reactions  . Benadryl [Diphenhydramine] Palpitations    Objective:     Vital Signs:   Temp:  [98 F (36.7 C)-99.2 F (37.3 C)]  98 F (36.7 C) (11/07 0500) Pulse Rate:  [56-114] 98 (11/07 0800) Resp:  [17-28] 17 (11/07 0500) BP: (108-135)/(75-97) 131/95 (11/07 0500) SpO2:  [94 %-100 %] 97 % (11/07 0500) Weight:  [77 kg-77.9 kg] 77 kg (11/07 0500) Last BM Date: 12/26/19  Weight change: Filed Weights   12/25/19 2215 12/26/19 1400 12/27/19 0500  Weight: 75.8 kg 77.9 kg 77 kg    Intake/Output:   Intake/Output Summary (Last 24 hours) at 12/27/2019 1020 Last data filed at 12/27/2019 0035 Gross per 24 hour  Intake 543.37 ml  Output 1500 ml  Net -956.63 ml      Physical Exam    General:  Lying in bed  No resp difficulty HEENT: normal Neck: supple. JVP to jaw  . Carotids 2+ bilat; no bruits. No lymphadenopathy or thyromegaly appreciated. Cor: PMI nondisplaced. Irregular tachy +s3 2/6 MR. Lungs: clear Abdomen: soft, nontender, nondistended. No hepatosplenomegaly. No bruits or masses. Good bowel sounds. + perc tube Extremities: no cyanosis, clubbing, rash, 1+ edema Neuro: alert & orientedx3, cranial nerves grossly intact. moves all 4 extremities w/o difficulty. Affect pleasant   Telemetry   AF 90-120 Personally reviewed   Labs   Basic Metabolic Panel: Recent Labs  Lab 12/25/19 1757 12/25/19 1757 12/26/19 0438 12/26/19 1734 12/27/19 0429  NA 139  --  139 138 139  K 3.5  --  4.3 4.1 3.6  CL 105  --  103 103 102  CO2 22  --  23 26 24   GLUCOSE 105*  --  118* 111* 100*  BUN 11  --  13 14 18   CREATININE 1.69*  --  1.70* 1.95* 2.10*  CALCIUM 8.8*   < > 8.9 9.2 8.9   < > = values in this interval not displayed.    Liver Function Tests: No results for input(s): AST, ALT, ALKPHOS, BILITOT, PROT, ALBUMIN in the last 168 hours. No results for input(s): LIPASE, AMYLASE in the last 168 hours. No results for input(s): AMMONIA in the last 168 hours.  CBC: Recent Labs  Lab 12/25/19 1757 12/26/19 0428 12/27/19 0429  WBC 5.3 5.6 5.0   NEUTROABS 3.5  --   --   HGB 9.2* 9.2* 9.2*  HCT 30.0* 29.9* 28.7*  MCV 92.3 92.3 89.4  PLT 195 204 199    Cardiac Enzymes: No results for input(s): CKTOTAL, CKMB, CKMBINDEX, TROPONINI in the last 168 hours.  BNP: BNP (last 3 results) Recent Labs    06/01/19 1413 06/23/19 1527 12/25/19 1757  BNP 531.4* 613.2* 776.7*    ProBNP (last 3 results) No results for input(s): PROBNP in the last 8760 hours.   CBG: No results for input(s): GLUCAP in the last 168 hours.  Coagulation Studies: No results for input(s): LABPROT, INR in the last 72 hours.   Imaging    No results found.   Medications:     Current Medications: . amLODipine  5 mg Oral QHS  . apixaban  5 mg Oral BID  . atorvastatin  20 mg Oral QPM  . digoxin  0.0625 mg Oral Daily  . furosemide  80 mg Intravenous BID  . gabapentin  600 mg Oral TID  . hydrOXYzine  25 mg Oral QHS  . pantoprazole  40 mg Oral Daily  . potassium chloride  40 mEq Oral Daily  . ranolazine  500 mg Oral BID  . sertraline  25 mg Oral Daily     Infusions: . amiodarone 30 mg/hr (12/27/19 0127)  Assessment/Plan   1. Acute on chronic systolic HF due to NICM (? Tachy CM) -- [facility] 10/2018 showed normal cors, elevated filling pressures and low CI at 1.9  - TTE 11/2018 LVEF 20%, mild RV dysfunction - TEE 5/21 EF 20-25% with moderate to severe RV dysfunction.  - admit 06/2019 with severe HF, required IABP and milrinone. - now back in HF with volume overload in setting of recurrent AF of unknown duration - continue IV lasix will give dose metolazone - at high risk for recurrent low output  - has not been on beta blockers due to low output HF - with renal dysfunction has not been on ACE/ARB/ARNI - hold digoxin with worsening renal function.  - Get PICC to measure CVP and co-ox - repeat echo - poor LVAD candidate with RV dysfunction, noncompliance and renal failure.  2.  Persistent Afib with RVR - remains in AF. Now on amio  gtt.  - In 5/21 had refractory AF requiring multiple DC-CVs, high-dose amio and ranexa. He reports intermittent compliance with amio and Eliquis. He says he has just missed a few doses but I suspect it is worse than that - will rebolus amio and increase gtt to 60. Continue apixaban - Will need repeat TEE/DC-CV when HF more stable (await PICC) - prior TEE/DCCV 06/2019 - has missed some eliquis doses, would required TEE prior to repeat DCCV. - continue ranexa  3. CKD IIIb - baseline Cr looks to be around 1.6 to 1.7 - admission Cr 1.69, no wup to 2.1 - concern for low output  4. Elevated troponin - 10/2018 cath clean coronaries - elevated trops due to afib with RVR, CHF  5. Chronic cholecystitis with perc drain -  Per primary team  6. Iron deficiency anemia - will need feraheme  7. Depression - Watch QT closely on ranexa, amio and sertraline. Low threshold to stop sertraline   Length of Stay: 1  Glori Bickers, MD  12/27/2019, 10:20 AM  Advanced Heart Failure Team Pager [phone] (M-F; 7a - 4p)  Please contact [facility] for night-coverage after hours (4p -7a ) and weekends on [email or web address]

## 2019-12-27 NOTE — Evaluation (Signed)
Occupational Therapy Evaluation Patient Details Name: Glenn [name] MRN: [id number] DOB: 16-Jun-1950 Today's Date: 12/27/2019    History of Present Illness Glenn [name] is a 69 y.o. male with a hx of chronic systolic HF 2/2 NICM s/p BSci ICD, atrial fibrillation, h/o stroke (Lg L MCA infarct w/ hemorrhagic conversion in 2018), h/o DVT, chornic a/c therapy, HTN, HLD, pulmonary nodule, Hepatitis C, carpal tunnel,  tobacco abuse (smokes ~1/4 ppd) and chronic cholecystitis with a chronic perc drain, who is being seen today for the evaluation of Afib RVR and CHF at the request of Dr Regenia Skeeter.   Clinical Impression   Pt PTA: Pt living with son and reports independence with ADL and mobility; son assists with IADLs. Currently, pt performing ADL functional mobility and transfers with SupervisionA. Pt at baseline for UB/LB ADL tasks with supervisionA to modified independence. Pt education on energy conservation strategies; handout provided. Pt already using most strategies. HR <130s with exertion. Pt does not require continued OT skilled services as pt is at his functional baseline for ADL and ADL functional mobility. OT signing off. Thank you for this referral.    Follow Up Recommendations  No OT follow up;Supervision - Intermittent    Equipment Recommendations  None recommended by OT    Recommendations for Other Services       Precautions / Restrictions Precautions Precautions: Fall;Other (comment) Precaution Comments: watch HR Restrictions Weight Bearing Restrictions: No      Mobility Bed Mobility Overal bed mobility: Independent                  Transfers Overall transfer level: Independent Equipment used: None                  Balance Overall balance assessment: Mild deficits observed, not formally tested                                         ADL either performed or assessed with clinical judgement   ADL Overall ADL's : At  baseline;Modified independent                                       General ADL Comments: Pt performing ADL functional mobility and transfers with SupervisionA. Pt at baseline for UB/LB ADL tasks. Pt education on energy conservation strategies; handout provided. Pt already using most strategies.      Vision Baseline Vision/History: No visual deficits Patient Visual Report: No change from baseline Vision Assessment?: No apparent visual deficits     Perception     Praxis      Pertinent Vitals/Pain Pain Assessment: 0-10 Pain Score: 0-No pain Pain Intervention(s): Monitored during session     Hand Dominance Right   Extremity/Trunk Assessment Upper Extremity Assessment Upper Extremity Assessment: Generalized weakness;RUE deficits/detail RUE Deficits / Details: strength 4/5 MM grade, some incoordination from previous CVA   Lower Extremity Assessment Lower Extremity Assessment: Defer to PT evaluation   Cervical / Trunk Assessment Cervical / Trunk Assessment: Normal   Communication Communication Communication: No difficulties   Cognition Arousal/Alertness: Awake/alert Behavior During Therapy: WFL for tasks assessed/performed Overall Cognitive Status: Within Functional Limits for tasks assessed  General Comments: A/O x4   General Comments  HR <130s with exertion.     Exercises     Shoulder Instructions      Home Living Family/patient expects to be discharged to:: Private residence Living Arrangements: Children (son) Available Help at Discharge: Family;Available PRN/intermittently Type of Home: Apartment Home Access: Level entry     Home Layout: One level     Bathroom Shower/Tub: Teacher, early years/pre: Standard     Home Equipment: Cane - single point;Walker - standard;Bedside commode          Prior Functioning/Environment Level of Independence: Needs assistance  Gait / Transfers  Assistance Needed: using cane ADL's / Homemaking Assistance Needed: independent ADL's, son assists with IADL's            OT Problem List:        OT Treatment/Interventions:      OT Goals(Current goals can be found in the care plan section) Acute Rehab OT Goals Patient Stated Goal: 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) OT Goal Formulation: With patient Time For Goal Achievement: 01/10/20 Potential to Achieve Goals: Good  OT Frequency:     Barriers to D/C:            Co-evaluation              AM-PAC OT "6 Clicks" Daily Activity     Outcome Measure Help from another person eating meals?: None Help from another person taking care of personal grooming?: None Help from another person toileting, which includes using toliet, bedpan, or urinal?: None Help from another person bathing (including washing, rinsing, drying)?: A Little Help from another person to put on and taking off regular upper body clothing?: None Help from another person to put on and taking off regular lower body clothing?: None 6 Click Score: 23   End of Session Nurse Communication: Mobility status  Activity Tolerance: Patient tolerated treatment well Patient left: in bed;with call bell/phone within reach  OT Visit Diagnosis: Unsteadiness on feet (R26.81)                Time: 2924-4628 OT Time Calculation (min): 18 min Charges:  OT General Charges $OT Visit: 1 Visit OT Evaluation $OT Eval Low Complexity: 1 Low  Jefferey Pica, OTR/L Acute Rehabilitation Services Pager: [phone] Office: [phone]   Jefferey Pica 12/27/2019, 2:30 PM

## 2019-12-27 NOTE — Consult Note (Signed)
[facility] Nurse Consult Note: Reason for Consult: Drain tube out in RLQ.  Discussed with Dr. Ardelia Mems via [place] and there is no role for [place] nursing at this time. Her team has simultaneously consulted with [facility] to determine if drain tube can/will be reinserted.  [facility] nursing will be re consulted if a need exists after that consultation/intervention.  [place] nursing team will not follow.  Thank you. Maudie Flakes, MSN, RN, [place], [facility]  Pager# [phone]

## 2019-12-28 ENCOUNTER — Inpatient Hospital Stay (HOSPITAL_COMMUNITY): Payer: Medicare HMO

## 2019-12-28 DIAGNOSIS — I4819 Other persistent atrial fibrillation: Secondary | ICD-10-CM | POA: Diagnosis not present

## 2019-12-28 DIAGNOSIS — I5043 Acute on chronic combined systolic (congestive) and diastolic (congestive) heart failure: Secondary | ICD-10-CM

## 2019-12-28 DIAGNOSIS — I5023 Acute on chronic systolic (congestive) heart failure: Secondary | ICD-10-CM | POA: Diagnosis not present

## 2019-12-28 DIAGNOSIS — R7989 Other specified abnormal findings of blood chemistry: Secondary | ICD-10-CM | POA: Diagnosis not present

## 2019-12-28 DIAGNOSIS — N1832 Chronic kidney disease, stage 3b: Secondary | ICD-10-CM | POA: Diagnosis not present

## 2019-12-28 LAB — BASIC METABOLIC PANEL
Anion gap: 8 (ref 5–15)
Anion gap: 14 (ref 5–15)
BUN: 25 mg/dL — ABNORMAL HIGH (ref 8–23)
BUN: 23 mg/dL (ref 8–23)
CO2: 28 mmol/L (ref 22–32)
CO2: 25 mmol/L (ref 22–32)
Calcium: 8.8 mg/dL — ABNORMAL LOW (ref 8.9–10.3)
Calcium: 8.9 mg/dL (ref 8.9–10.3)
Chloride: 103 mmol/L (ref 98–111)
Chloride: 96 mmol/L — ABNORMAL LOW (ref 98–111)
Creatinine, Ser: 2.47 mg/dL — ABNORMAL HIGH (ref 0.61–1.24)
Creatinine, Ser: 2.44 mg/dL — ABNORMAL HIGH (ref 0.61–1.24)
GFR, Estimated: 28 mL/min — ABNORMAL LOW (ref >60)
GFR, Estimated: 28 mL/min — ABNORMAL LOW (ref >60)
Glucose, Bld: 142 mg/dL — ABNORMAL HIGH (ref 70–99)
Glucose, Bld: 222 mg/dL — ABNORMAL HIGH (ref 70–99)
Potassium: 3.7 mmol/L (ref 3.5–5.1)
Potassium: 3.2 mmol/L — ABNORMAL LOW (ref 3.5–5.1)
Sodium: 139 mmol/L (ref 135–145)
Sodium: 135 mmol/L (ref 135–145)

## 2019-12-28 LAB — COOXEMETRY PANEL
Carboxyhemoglobin: 1.8 % — ABNORMAL HIGH (ref 0.5–1.5)
Carboxyhemoglobin: 1.5 % (ref 0.5–1.5)
Carboxyhemoglobin: 1.6 % — ABNORMAL HIGH (ref 0.5–1.5)
Carboxyhemoglobin: 1.4 % (ref 0.5–1.5)
Carboxyhemoglobin: 1.4 % (ref 0.5–1.5)
Methemoglobin: 0.8 % (ref 0.0–1.5)
Methemoglobin: 1 % (ref 0.0–1.5)
Methemoglobin: 1.3 % (ref 0.0–1.5)
Methemoglobin: 1 % (ref 0.0–1.5)
Methemoglobin: 1 % (ref 0.0–1.5)
O2 Saturation: 57.1 %
O2 Saturation: 51.2 %
O2 Saturation: 44.7 %
O2 Saturation: 48.3 %
O2 Saturation: 43.8 %
Total hemoglobin: 9 g/dL — ABNORMAL LOW (ref 12.0–16.0)
Total hemoglobin: 10.2 g/dL — ABNORMAL LOW (ref 12.0–16.0)
Total hemoglobin: 9.2 g/dL — ABNORMAL LOW (ref 12.0–16.0)
Total hemoglobin: 10.8 g/dL — ABNORMAL LOW (ref 12.0–16.0)
Total hemoglobin: 9.5 g/dL — ABNORMAL LOW (ref 12.0–16.0)

## 2019-12-28 LAB — CBC
HCT: 27.2 % — ABNORMAL LOW (ref 39.0–52.0)
Hemoglobin: 8.6 g/dL — ABNORMAL LOW (ref 13.0–17.0)
MCH: 28.1 pg (ref 26.0–34.0)
MCHC: 31.6 g/dL (ref 30.0–36.0)
MCV: 88.9 fL (ref 80.0–100.0)
Platelets: 199 10*3/uL (ref 150–400)
RBC: 3.06 MIL/uL — ABNORMAL LOW (ref 4.22–5.81)
RDW: 15.3 % (ref 11.5–15.5)
WBC: 6.8 10*3/uL (ref 4.0–10.5)
nRBC: 0 % (ref 0.0–0.2)

## 2019-12-28 LAB — ECHOCARDIOGRAM COMPLETE
Area-P 1/2: 4.06 cm2
Height: 67 in
P 1/2 time: 355 msec
S' Lateral: 5 cm
Weight: 2720 oz

## 2019-12-28 MED ORDER — NOREPINEPHRINE 4 MG/250ML-% IV SOLN
0.0000 ug/min | INTRAVENOUS | Status: DC
  Administered 2019-12-28: 4 ug/min via INTRAVENOUS

## 2019-12-28 MED ORDER — POTASSIUM CHLORIDE CRYS ER 20 MEQ PO TBCR
40.0000 meq | EXTENDED_RELEASE_TABLET | ORAL | Status: AC
  Administered 2019-12-28: 40 meq via ORAL
  Filled 2019-12-28: qty 2

## 2019-12-28 MED ORDER — MILRINONE LACTATE IN DEXTROSE 20-5 MG/100ML-% IV SOLN
0.5000 ug/kg/min | INTRAVENOUS | Status: DC
  Administered 2019-12-28 (×2): 0.25 ug/kg/min via INTRAVENOUS
  Filled 2019-12-28: qty 100

## 2019-12-28 MED ORDER — NOREPINEPHRINE 4 MG/250ML-% IV SOLN
INTRAVENOUS | Status: AC
  Filled 2019-12-28: qty 250

## 2019-12-28 MED ORDER — MILRINONE LACTATE IN DEXTROSE 20-5 MG/100ML-% IV SOLN
0.3750 ug/kg/min | INTRAVENOUS | Status: DC
  Administered 2019-12-28: 0.375 ug/kg/min via INTRAVENOUS
  Filled 2019-12-28: qty 100

## 2019-12-28 MED ORDER — POTASSIUM CHLORIDE CRYS ER 20 MEQ PO TBCR
40.0000 meq | EXTENDED_RELEASE_TABLET | Freq: Once | ORAL | Status: AC
  Administered 2019-12-28: 40 meq via ORAL
  Filled 2019-12-28: qty 2

## 2019-12-28 MED ORDER — SODIUM CHLORIDE 0.9 % IV SOLN
510.0000 mg | Freq: Once | INTRAVENOUS | Status: AC
  Administered 2019-12-28: 510 mg via INTRAVENOUS
  Filled 2019-12-28: qty 17

## 2019-12-28 NOTE — Progress Notes (Signed)
Family Medicine Teaching Service Attending Brief Progress Note  S: Patient seen and examined. Patient reports he is doing well, has no complaints.  O: BP 136/82 (BP Location: Left Arm)   Pulse (!) 106   Temp 97.6 F (36.4 C) (Axillary)   Resp 20   Ht 5\' 7"  (1.702 m)   Wt 77.1 kg   SpO2 100%   BMI 26.63 kg/m   Gen: no acute distress, pleasant, cooperative Resp: normal work of breathing  Heart: tachycardic, regular rhythm   A/P:  69 yo M with afib/RVR, severe systolic dysfunction, AKI on CKD 3B.  Now on milrinone, CO-OX's being monitored by CHF team Spoke with Dr. Haroldine Laws who plans to assume care as patient requires transfer to cardiac ICU for more intensive support. Greatly appreciate CHF team's management.  Still need to speak with general surgery about biliary drain and whether it needs replaced.  Will cosign resident note when it is available.  Chrisandra Netters, MD [place]

## 2019-12-28 NOTE — Progress Notes (Signed)
°  Echocardiogram 2D Echocardiogram has been performed.  Glenn [name] 12/28/2019, 9:07 AM

## 2019-12-28 NOTE — Progress Notes (Signed)
Spoke to surgery regarding patient's biliary drain after being pulled out about a month ago.  Patient was supposed to have follow-up with surgery today.  General surgery recommends to reconsult in the morning for evaluation tomorrow.  They are aware that he is currently in the cardiac ICU.  Wilber Oliphant, M.D.  6:09 PM 12/28/2019

## 2019-12-28 NOTE — Progress Notes (Addendum)
Advanced Heart Failure Rounding Note  PCP-Cardiologist: Virl Axe, MD   Subjective:    Yesterday PICC placed with initial CO-OX 31%. Milrinone 0.25 mcg started. Todays CO-OX is 57%.   Lab Results  Component Value Date   CREATININE 2.47 (H) 12/28/2019   CREATININE 2.10 (H) 12/27/2019   CREATININE 1.95 (H) 12/26/2019     Denies SOB. Denies nausea.    Objective:   Weight Range: 77.1 kg Body mass index is 26.63 kg/m.   Vital Signs:   Temp:  [98.5 F (36.9 C)-98.7 F (37.1 C)] 98.7 F (37.1 C) (11/08 0420) Pulse Rate:  [98-109] 106 (11/08 0420) Resp:  [16-18] 18 (11/08 0420) BP: (102-136)/(68-83) 102/68 (11/08 0420) SpO2:  [93 %-99 %] 96 % (11/08 0420) Weight:  [77.1 kg] 77.1 kg (11/08 0420) Last BM Date: 12/26/19  Weight change: Filed Weights   12/26/19 1400 12/27/19 0500 12/28/19 0420  Weight: 77.9 kg 77 kg 77.1 kg    Intake/Output:   Intake/Output Summary (Last 24 hours) at 12/28/2019 0759 Last data filed at 12/28/2019 0522 Gross per 24 hour  Intake 749.68 ml  Output 1525 ml  Net -775.32 ml      Physical Exam   CVP 15-16 personally checked.  General:  Well appearing. No resp difficulty HEENT: Normal Neck: Supple. JVP to jaw . Carotids 2+ bilat; no bruits. No lymphadenopathy or thyromegaly appreciated. Cor: PMI nondisplaced. Irregular rate & rhythm. No rubs, gallops or murmurs. Lungs: Clear Abdomen: Soft, nontender, nondistended. No hepatosplenomegaly. No bruits or masses. Good bowel sounds. Extremities: No cyanosis, clubbing, rash, edema Neuro: Alert & orientedx3, cranial nerves grossly intact. moves all 4 extremities w/o difficulty. Affect pleasant   Telemetry    A fib 100s with PVCs.   EKG    n/a  Labs    CBC Recent Labs    12/25/19 1757 12/26/19 0428 12/27/19 0429 12/28/19 0536  WBC 5.3   < > 5.0 6.8  NEUTROABS 3.5  --   --   --   HGB 9.2*   < > 9.2* 8.6*  HCT 30.0*   < > 28.7* 27.2*  MCV 92.3   < > 89.4 88.9  PLT 195   <  > 199 199   < > = values in this interval not displayed.   Basic Metabolic Panel Recent Labs    12/27/19 0429 12/28/19 0536  NA 139 139  K 3.6 3.7  CL 102 103  CO2 24 28  GLUCOSE 100* 142*  BUN 18 25*  CREATININE 2.10* 2.47*  CALCIUM 8.9 8.8*   Liver Function Tests No results for input(s): AST, ALT, ALKPHOS, BILITOT, PROT, ALBUMIN in the last 72 hours. No results for input(s): LIPASE, AMYLASE in the last 72 hours. Cardiac Enzymes No results for input(s): CKTOTAL, CKMB, CKMBINDEX, TROPONINI in the last 72 hours.  BNP: BNP (last 3 results) Recent Labs    06/01/19 1413 06/23/19 1527 12/25/19 1757  BNP 531.4* 613.2* 776.7*    ProBNP (last 3 results) No results for input(s): PROBNP in the last 8760 hours.   D-Dimer No results for input(s): DDIMER in the last 72 hours. Hemoglobin A1C Recent Labs    12/26/19 0428  HGBA1C 4.6*   Fasting Lipid Panel Recent Labs    12/26/19 0438  CHOL 103  HDL 26*  LDLCALC 63  TRIG 68  CHOLHDL 4.0   Thyroid Function Tests No results for input(s): TSH, T4TOTAL, T3FREE, THYROIDAB in the last 72 hours.  Invalid input(s): FREET3  Other results:   Imaging    DG CHEST PORT 1 VIEW  Result Date: 12/27/2019 CLINICAL DATA:  PICC line placement EXAM: PORTABLE CHEST 1 VIEW COMPARISON:  12/25/2019 FINDINGS: Single frontal view of the chest demonstrates interval placement of a right-sided PICC tip overlying superior vena cava. Single lead AICD unchanged. Cardiac silhouette remains enlarged. No airspace disease, effusion, or pneumothorax. IMPRESSION: 1. No complication after right-sided PICC placement. Electronically Signed   By: Randa Ngo M.D.   On: 12/27/2019 16:16   [place] EKG SITE RITE  Result Date: 12/27/2019 If Site Rite image not attached, placement could not be confirmed due to current cardiac rhythm.     Medications:     Scheduled Medications: . apixaban  5 mg Oral BID  . atorvastatin  20 mg Oral QPM  .  Chlorhexidine Gluconate Cloth  6 each Topical Daily  . furosemide  80 mg Intravenous BID  . gabapentin  300 mg Oral BID  . hydrOXYzine  25 mg Oral QHS  . pantoprazole  40 mg Oral Daily  . potassium chloride  40 mEq Oral Daily  . ranolazine  500 mg Oral BID  . sertraline  25 mg Oral Daily  . sodium chloride flush  10-40 mL Intracatheter Q12H     Infusions: . amiodarone 60 mg/hr (12/28/19 0223)  . milrinone 0.25 mcg/kg/min (12/27/19 2152)     PRN Medications:  acetaminophen, calcium carbonate, hydrOXYzine, ondansetron (ZOFRAN) IV, sodium chloride flush    Patient Profile  69 y.o.malewith a hx of chronic systolic HF 2/2 NICM s/pBSciICD, persistent atrial fibrillation, h/o stroke (Lg L MCA infarct w/ hemorrhagic conversionin 2018), HTN, HCV, tobacco abuse (smokes ~1/4 ppd),  and chronic cholecystitiswith achronic perc drain.   Admitted with ADHF. Started on milrinone.    Assessment/Plan  1. Acute on chronic systolic HF due to NICM (? Tachy CM) --[facility] 10/2018 showed normal cors, elevated filling pressures and low CI at 1.9 - TTE 11/2018 LVEF 20%, mild RV dysfunction - TEE 5/21 EF 20-25% with moderate to severe RV dysfunction.  - admit 06/2019 with severe HF, required IABP and milrinone. -Admitted with ADHF. Yesterday milrinone 0.25 mcg started due to low output HF.  CO-OX 57% on milrinone 0.25 mcg--> Increase milrinone 0.375 mcg.  CVP 15.  Continue lasix 80 mg twice a day. Check BMET at 1300 .  -  has not been on beta blockers due to low output HF -No room for hydral/imdur need to allow BP to run a little higher for renal perfusion.  -No dig or ACE/ARB/ARNI with worsening renal function.  - ECHO results pending.  - poor LVAD candidate with RV dysfunction, noncompliance and renal failure.  2.  Persistent Afib with RVR - remains in AF. Now on amio gtt. Rate in the low 100s. Continue current dose.  - In 5/21 had refractory AF requiring multiple DC-CVs, high-dose amio  and ranexa. He reports intermittent compliance with amio and Eliquis. He says he has just missed a few doses .  - prior TEE/DCCV 06/2019 - has missed some eliquis doses, would required TEE prior to repeat DCCV. Says he was out eliquis prior to admit.  - Continue eliquis.  - continue ranexa  3. CKD IIIb - baseline Cr looks to be around 1.6 to 1.7 - admission Cr 1.69-->2.5.   4. Elevated troponin - 10/2018 cath clean coronaries - elevated trops due to afib with RVR, CHF  5. Chronic cholecystitis with perc drain -  Per primary team  6.  Iron deficiency anemia - Iron sats low.  - Give feraheme x 2 doses.   7. Depression - Watch QT closely on ranexa, amio and sertraline. Low threshold to stop sertraline   Check CO-OX BMET at 1200.  Repeat ECHO results pending.  Not a good candidate for advanced therapies with non compliance.   Advanced Heart Failure Team Pager [phone] (M-F; [place])  Please contact [facility] for night-coverage after hours (4p -7a ) and weekends on [email or web address]  Amy Clegg 8:05 AM  Length of Stay: 2   Patient seen and examined with the above-signed Advanced Practice Provider and/or Housestaff. I personally reviewed laboratory data, imaging studies and relevant notes. I independently examined the patient and formulated the important aspects of the plan. I have edited the note to reflect any of my changes or salient points. I have personally discussed the plan with the patient and/or family.  PICC placed yesterday. Co-ox 31%. Started on milrinone. Co-ox up to 57% CVP 15. Remains in AF despite IV amio.  General:  Sitting up in bed No resp difficulty HEENT: normal Neck: supple. JVP to ear . Carotids 2+ bilat; no bruits. No lymphadenopathy or thryomegaly appreciated. Cor: PMI nondisplaced. Irregular rate & rhythm. 3/6 MR + s3 Lungs: clear Abdomen: soft, nontender, nondistended. No hepatosplenomegaly. No bruits or masses. Good bowel sounds. + perc  tube Extremities: no cyanosis, clubbing, rash, edema Neuro: alert & orientedx3, cranial nerves grossly intact. moves all 4 extremities w/o difficulty. Affect pleasant  He remains very tenuous. Continue milrinone. Continue IV lasix and amio. Once more stable will need repeat TEE/DC-CV. Watch QT. Echo pending today.   Not candidate for advanced therapies with poor compliance. Will need to d/w Dr. [name] regarding possible AVN ablation and CRT.  Glori Bickers, MD  9:02 AM

## 2019-12-28 NOTE — Progress Notes (Signed)
  Patient now on high-dose milrinone with falling co-ox. Poor urine output.  Remains SOB.   Continue with AF on IV amio.   Will move to ICU for NE support.   I do not think he is a candidate for advanced therapies with CKD and poor compliance.  D/w Dr. Ardelia Mems.    CRITICAL CARE Performed by: Glori Bickers  Total critical care time: 35 minutes  Critical care time was exclusive of separately billable procedures and treating other patients.  Critical care was necessary to treat or prevent imminent or life-threatening deterioration.  Critical care was time spent personally by me (independent of midlevel providers or residents) on the following activities: development of treatment plan with patient and/or surrogate as well as nursing, discussions with consultants, evaluation of patient's response to treatment, examination of patient, obtaining history from patient or surrogate, ordering and performing treatments and interventions, ordering and review of laboratory studies, ordering and review of radiographic studies, pulse oximetry and re-evaluation of patient's condition.  Glori Bickers, MD  4:54 PM

## 2019-12-28 NOTE — Progress Notes (Signed)
Family Medicine Teaching Service Daily Progress Note Intern Pager: [phone]  Patient name: Glenn [name] Medical record number: [id number] Date of birth: Sep 02, 1950 Age: 69 y.o. Gender: male  Primary Care Provider: Gerlene Fee, DO Consultants: Cardiology Code Status: Full  Pt Overview and Major Events to Date:  Admitted 11/5  Assessment and Plan: Glenn Werber Dejournetteis a 69 y.o.malepresenting with shortness of breath and dizziness. PMH is significant forA. fib with RVR on Eliquis, CKD 3, HLD, Large left MCA infarction with hemorrhagic conversion embolic due to A. fib (July 2018), RMCA CVA (September 2020), ICD (2018), HTN, chronic hep C, tobacco use disorder, and ischemic cardiomyopathy.  Afib RVR Heart Failure Exacerbation Update:  No TTE to be done today. Cardiology to assume care and transfer patient to ICU given requirement of Pressor support.  Hypertension, chronic, stable: BP this morning 131/95 -Continue amlodipine 5 mg  CKD 3B:  Patient's GFR on admission 44, creatinineinitially 1.7 but has been gradually increasing to 2.52.  Getting Lasix IV 80 mg BID.   Will avoid giving fluids given severe HFrEF.   -Avoid nephrotoxic medications - Cardiology reccomending holding Digoxin -Monitor creatinine withBMP  HLD: Patient has a history of 2 prior CVAs (2018, 2020). Patient currently prescribed atorvastatin 20 mg, lipid panel within normal limits except for low HDL. -Continue atorvastatin 20 mg daily -Monitor for statin intolerance  Chronic cholecystitischolelithiasis with JP drain Patient with history of cholecystitis for IR placed a biliary drain in 2020. Due to patient's significant heart history is a poor surgical candidate for cholecystectomy. Patient was seen in October 2021 in the emergency department when his drain got "ripped out". Ostomy currently covered with gauze. - Will contact general surgery to readdress this drainage  Chronic  normocytic anemia Patient's hemoglobin on admission 9.2.  Has been stable throughout.   -Ferrous sulfate 325 mg daily -AMCBC  Depression Anxiety,chronic, stable -Continue Zoloft 25 mg Daily -Monitor mood while hospitalized  COPD Patient with normal work of breathing, satting well on room air.Home medications include albuterol nebulizer Q6H PRN for SOB/wheezing -Will hold albuterol for now in the setting of tachycardia -Continue albuterol once heart rate within normal  GERD  Patient on home Protonix 40 mg daily.  -continue home Protonix   FEN/GI:N.p.o. pending any cardiology intervention Prophylaxis:Eliquis 5 mg twice daily  Status is: Inpatient  Remains inpatient appropriate because:Inpatient level of care appropriate due to severity of illness   Dispo: The patient is from: Home              Anticipated d/c is to: Home              Anticipated d/c date is: > 3 days              Patient currently is not medically stable to d/c.     Subjective:  Patient indicates he is feeling okay now.  Not having any issues.  Denies chest pain or difficulty breathing.  Denies   Objective: Temp:  [97.6 F (36.4 C)-98.7 F (37.1 C)] 97.6 F (36.4 C) (11/08 1304) Pulse Rate:  [69-109] 106 (11/08 1248) Resp:  [16-20] 20 (11/08 1248) BP: (102-136)/(68-84) 136/82 (11/08 1248) SpO2:  [93 %-100 %] 100 % (11/08 1248) Weight:  [77.1 kg] 77.1 kg (11/08 0420) Physical Exam:  Physical Exam Constitutional:      General: He is not in acute distress.    Appearance: He is not ill-appearing.     Comments: Exam limited as patient receiving TTE  HENT:  Head: Normocephalic and atraumatic.  Pulmonary:     Effort: Pulmonary effort is normal.  Skin:    General: Skin is warm.  Neurological:     Mental Status: He is alert.     Laboratory: Recent Labs  Lab 12/26/19 0428 12/27/19 0429 12/28/19 0536  WBC 5.6 5.0 6.8  HGB 9.2* 9.2* 8.6*  HCT 29.9* 28.7* 27.2*  PLT 204 199  199   Recent Labs  Lab 12/27/19 0429 12/28/19 0536 12/28/19 1200  NA 139 139 135  K 3.6 3.7 3.2*  CL 102 103 96*  CO2 24 28 25   BUN 18 25* 23  CREATININE 2.10* 2.47* 2.44*  CALCIUM 8.9 8.8* 8.9  GLUCOSE 100* 142* 222*    Imaging/Diagnostic Tests:  TTE pending  Delora Fuel, MD 12/28/2019, 5:02 PM PGY-1, [place] Intern pager: [phone], text pages welcome

## 2019-12-29 DIAGNOSIS — I4819 Other persistent atrial fibrillation: Secondary | ICD-10-CM | POA: Diagnosis not present

## 2019-12-29 DIAGNOSIS — N1832 Chronic kidney disease, stage 3b: Secondary | ICD-10-CM | POA: Diagnosis not present

## 2019-12-29 DIAGNOSIS — I5023 Acute on chronic systolic (congestive) heart failure: Secondary | ICD-10-CM | POA: Diagnosis not present

## 2019-12-29 LAB — BASIC METABOLIC PANEL
Anion gap: 10 (ref 5–15)
BUN: 25 mg/dL — ABNORMAL HIGH (ref 8–23)
CO2: 27 mmol/L (ref 22–32)
Calcium: 8.9 mg/dL (ref 8.9–10.3)
Chloride: 97 mmol/L — ABNORMAL LOW (ref 98–111)
Creatinine, Ser: 2.51 mg/dL — ABNORMAL HIGH (ref 0.61–1.24)
GFR, Estimated: 27 mL/min — ABNORMAL LOW (ref >60)
Glucose, Bld: 149 mg/dL — ABNORMAL HIGH (ref 70–99)
Potassium: 3.5 mmol/L (ref 3.5–5.1)
Sodium: 134 mmol/L — ABNORMAL LOW (ref 135–145)

## 2019-12-29 LAB — COOXEMETRY PANEL
Carboxyhemoglobin: 1.5 % (ref 0.5–1.5)
Carboxyhemoglobin: 1.4 % (ref 0.5–1.5)
Carboxyhemoglobin: 1.3 % (ref 0.5–1.5)
Carboxyhemoglobin: 1.4 % (ref 0.5–1.5)
Methemoglobin: 1.1 % (ref 0.0–1.5)
Methemoglobin: 1.2 % (ref 0.0–1.5)
Methemoglobin: 1.2 % (ref 0.0–1.5)
Methemoglobin: 1 % (ref 0.0–1.5)
O2 Saturation: 47.2 %
O2 Saturation: 34.1 %
O2 Saturation: 33.1 %
O2 Saturation: 38.4 %
Total hemoglobin: 9.1 g/dL — ABNORMAL LOW (ref 12.0–16.0)
Total hemoglobin: 8.5 g/dL — ABNORMAL LOW (ref 12.0–16.0)
Total hemoglobin: 9.9 g/dL — ABNORMAL LOW (ref 12.0–16.0)
Total hemoglobin: 9.7 g/dL — ABNORMAL LOW (ref 12.0–16.0)

## 2019-12-29 LAB — CBC
HCT: 28.5 % — ABNORMAL LOW (ref 39.0–52.0)
Hemoglobin: 8.8 g/dL — ABNORMAL LOW (ref 13.0–17.0)
MCH: 28 pg (ref 26.0–34.0)
MCHC: 30.9 g/dL (ref 30.0–36.0)
MCV: 90.8 fL (ref 80.0–100.0)
Platelets: 209 10*3/uL (ref 150–400)
RBC: 3.14 MIL/uL — ABNORMAL LOW (ref 4.22–5.81)
RDW: 15.1 % (ref 11.5–15.5)
WBC: 5 10*3/uL (ref 4.0–10.5)
nRBC: 0 % (ref 0.0–0.2)

## 2019-12-29 MED ORDER — HEPARIN (PORCINE) 25000 UT/250ML-% IV SOLN: 1100.0000 [IU]/h | INTRAVENOUS | Status: DC

## 2019-12-29 MED ORDER — DOBUTAMINE IN D5W 4-5 MG/ML-% IV SOLN: 2.5000 ug/kg/min | INTRAVENOUS | Status: DC

## 2019-12-29 MED ORDER — DOBUTAMINE IN D5W 4-5 MG/ML-% IV SOLN
3.0000 ug/kg/min | INTRAVENOUS | Status: DC
  Administered 2019-12-29: 2.5 ug/kg/min via INTRAVENOUS
  Administered 2020-01-04: 3 ug/kg/min via INTRAVENOUS
  Administered 2020-01-07: 1 ug/kg/min via INTRAVENOUS
  Administered 2020-01-16: 3 ug/kg/min via INTRAVENOUS
  Filled 2019-12-29 (×5): qty 250

## 2019-12-29 MED ORDER — FUROSEMIDE 10 MG/ML IJ SOLN
20.0000 mg/h | INTRAVENOUS | Status: DC
  Administered 2019-12-29 – 2019-12-30 (×4): 20 mg/h via INTRAVENOUS
  Filled 2019-12-29 (×7): qty 20

## 2019-12-29 MED ORDER — METOLAZONE 2.5 MG PO TABS
2.5000 mg | ORAL_TABLET | Freq: Once | ORAL | Status: AC
  Administered 2019-12-29: 2.5 mg via ORAL
  Filled 2019-12-29: qty 1

## 2019-12-29 MED ORDER — MILRINONE LACTATE IN DEXTROSE 20-5 MG/100ML-% IV SOLN
0.5000 ug/kg/min | INTRAVENOUS | Status: DC
  Administered 2019-12-29: 0.5 ug/kg/min via INTRAVENOUS
  Filled 2019-12-29: qty 100

## 2019-12-29 MED ORDER — HEPARIN (PORCINE) 25000 UT/250ML-% IV SOLN
1550.0000 [IU]/h | INTRAVENOUS | Status: DC
  Administered 2019-12-29: 1200 [IU]/h via INTRAVENOUS
  Administered 2019-12-30: 1450 [IU]/h via INTRAVENOUS
  Administered 2019-12-31 – 2020-01-01 (×3): 1550 [IU]/h via INTRAVENOUS
  Administered 2020-01-02: 1450 [IU]/h via INTRAVENOUS
  Administered 2020-01-03 – 2020-01-04 (×2): 1500 [IU]/h via INTRAVENOUS
  Administered 2020-01-05 (×2): 1400 [IU]/h via INTRAVENOUS
  Administered 2020-01-06: 1550 [IU]/h via INTRAVENOUS
  Filled 2019-12-29 (×12): qty 250

## 2019-12-29 MED ORDER — POTASSIUM CHLORIDE CRYS ER 20 MEQ PO TBCR
40.0000 meq | EXTENDED_RELEASE_TABLET | Freq: Once | ORAL | Status: AC
  Administered 2019-12-29: 40 meq via ORAL
  Filled 2019-12-29: qty 2

## 2019-12-29 MED ORDER — SPIRONOLACTONE 12.5 MG HALF TABLET
12.5000 mg | ORAL_TABLET | Freq: Every day | ORAL | Status: DC
  Administered 2019-12-29: 12.5 mg via ORAL
  Filled 2019-12-29: qty 1

## 2019-12-29 NOTE — Progress Notes (Signed)
   On 0.375 mcg milrinone. CO-OX 37%.    Increase milrinone 0.5 mcg.   Repeat CO-OX at 1400.   Terrez Ander NP-C  12:00 PM

## 2019-12-29 NOTE — Progress Notes (Addendum)
   Persistently low CO-OX 33% on Milrinone 0.5 mcg.  Stop milrinone and start dobutamine 2.5 mcg now. Repeat CO-OX in 1 hour.  CVP 20. Continue lasix drip at current dose and give 2.5 mg metolazone.    Stop eliquis and start heparin drip. Likely will need swan +/- mechanical support. Not sure mechanical support will be pursued given we dont have a good endpoint.   For now he is requesting aggressive care.   If not a candidate for mechanical support will need Palliative Care for goals of care. Palliative Care saw him during his May 2021 admit.    Fidela Cieslak  NP-C  2:46 PM

## 2019-12-29 NOTE — Progress Notes (Signed)
Advanced Heart Failure Rounding Note  PCP-Cardiologist: Virl Axe, MD   Subjective:    PICC placed 11/6 with initial CO-OX 31%. Milrinone 0.25 started.  Co-ox remained in 30-40% range despite increasing milrinone. Moved to ICU and NE started.. Now on milrinone 0.25. NE decreased to 2 due to severe systemic hypertension,   Co-ox 47% CVP 18-19  Remains in AF on IV amio and Eliquis.    Objective:   Weight Range: 77.9 kg Body mass index is 26.9 kg/m.   Vital Signs:   Temp:  [97.6 F (36.4 C)-98.1 F (36.7 C)] 97.9 F (36.6 C) (11/09 0352) Pulse Rate:  [40-147] 111 (11/09 0609) Resp:  [13-27] 19 (11/09 0609) BP: (97-169)/(73-144) 120/79 (11/09 0609) SpO2:  [88 %-100 %] 96 % (11/09 0609) Weight:  [77.9 kg] 77.9 kg (11/09 0544) Last BM Date: 12/27/19  Weight change: Filed Weights   12/27/19 0500 12/28/19 0420 12/29/19 0544  Weight: 77 kg 77.1 kg 77.9 kg    Intake/Output:   Intake/Output Summary (Last 24 hours) at 12/29/2019 0628 Last data filed at 12/29/2019 0500 Gross per 24 hour  Intake 927.97 ml  Output 2000 ml  Net -1072.03 ml      Physical Exam   General:  Lying in bed  No resp difficulty HEENT: normal Neck: supple. JVP to jaw. Carotids 2+ bilat; no bruits. No lymphadenopathy or thryomegaly appreciated. Cor: PMI nondisplaced. Irregular rate & rhythm. + s3 Lungs: clear Abdomen: soft, nontender, nondistended. No hepatosplenomegaly. No bruits or masses. Good bowel sounds. Extremities: no cyanosis, clubbing, rash, edema Neuro: alert & orientedx3, cranial nerves grossly intact. moves all 4 extremities w/o difficulty. Affect pleasant  Telemetry    A fib 100-115 + PVCs.  Personally reviewed  Labs    CBC Recent Labs    12/28/19 0536 12/29/19 0535  WBC 6.8 5.0  HGB 8.6* 8.8*  HCT 27.2* 28.5*  MCV 88.9 90.8  PLT 199 301   Basic Metabolic Panel Recent Labs    12/28/19 0536 12/28/19 1200  NA 139 135  K 3.7 3.2*  CL 103 96*  CO2 28 25   GLUCOSE 142* 222*  BUN 25* 23  CREATININE 2.47* 2.44*  CALCIUM 8.8* 8.9   Liver Function Tests No results for input(s): AST, ALT, ALKPHOS, BILITOT, PROT, ALBUMIN in the last 72 hours. No results for input(s): LIPASE, AMYLASE in the last 72 hours. Cardiac Enzymes No results for input(s): CKTOTAL, CKMB, CKMBINDEX, TROPONINI in the last 72 hours.  BNP: BNP (last 3 results) Recent Labs    06/01/19 1413 06/23/19 1527 12/25/19 1757  BNP 531.4* 613.2* 776.7*    ProBNP (last 3 results) No results for input(s): PROBNP in the last 8760 hours.   D-Dimer No results for input(s): DDIMER in the last 72 hours. Hemoglobin A1C No results for input(s): HGBA1C in the last 72 hours. Fasting Lipid Panel No results for input(s): CHOL, HDL, LDLCALC, TRIG, CHOLHDL, LDLDIRECT in the last 72 hours. Thyroid Function Tests No results for input(s): TSH, T4TOTAL, T3FREE, THYROIDAB in the last 72 hours.  Invalid input(s): FREET3  Other results:   Imaging    ECHOCARDIOGRAM COMPLETE  Result Date: 12/28/2019    ECHOCARDIOGRAM REPORT   Patient Name:   Glenn [name] Date of Exam: 12/28/2019 Medical Rec #:  [id number]           Height:       67.0 in Accession #:    [id number]  Weight:       170.0 lb Date of Birth:  1951-01-15           BSA:          1.887 m Patient Age:    69 years            BP:           120/84 mmHg Patient Gender: M                   HR:           69 bpm. Exam Location:  Inpatient Procedure: 2D Echo, Cardiac Doppler and Color Doppler Indications:    CHF  History:        Patient has prior history of Echocardiogram examinations, most                 recent 06/09/2019. CHF and Cardiomyopathy, Defibrillator, Stroke,                 Arrythmias:Atrial Fibrillation; Risk Factors:Hypertension.  Sonographer:    Dustin Flock Referring Phys: [id number] Nolyn Eilert R Penda Venturi IMPRESSIONS  1. Left ventricular ejection fraction, by estimation, is 20 to 25%. The left ventricle has severely  decreased function. The left ventricle demonstrates global hypokinesis. The left ventricular internal cavity size was severely dilated. There is mild left ventricular hypertrophy. Left ventricular diastolic parameters are consistent with Grade II diastolic dysfunction (pseudonormalization). Elevated left ventricular end-diastolic pressure.  2. Pacing wires in RA/RV. Right ventricular systolic function is normal. The right ventricular size is normal. There is severely elevated pulmonary artery systolic pressure.  3. Left atrial size was severely dilated.  4. The mitral valve is normal in structure. Mild mitral valve regurgitation. No evidence of mitral stenosis.  5. Tricuspid valve regurgitation is severe.  6. The aortic valve is tricuspid. Aortic valve regurgitation is mild to moderate. Mild aortic valve sclerosis is present, with no evidence of aortic valve stenosis.  7. The inferior vena cava is dilated in size with >50% respiratory variability, suggesting right atrial pressure of 8 mmHg. FINDINGS  Left Ventricle: Left ventricular ejection fraction, by estimation, is 20 to 25%. The left ventricle has severely decreased function. The left ventricle demonstrates global hypokinesis. The left ventricular internal cavity size was severely dilated. There is mild left ventricular hypertrophy. Left ventricular diastolic parameters are consistent with Grade II diastolic dysfunction (pseudonormalization). Elevated left ventricular end-diastolic pressure. Right Ventricle: Pacing wires in RA/RV. The right ventricular size is normal. No increase in right ventricular wall thickness. Right ventricular systolic function is normal. There is severely elevated pulmonary artery systolic pressure. The tricuspid regurgitant velocity is 4.03 m/s, and with an assumed right atrial pressure of 8 mmHg, the estimated right ventricular systolic pressure is 92.0 mmHg. Left Atrium: Left atrial size was severely dilated. Right Atrium: Right  atrial size was normal in size. Pericardium: There is no evidence of pericardial effusion. Mitral Valve: The mitral valve is normal in structure. Mild mitral valve regurgitation. No evidence of mitral valve stenosis. Tricuspid Valve: The tricuspid valve is normal in structure. Tricuspid valve regurgitation is severe. No evidence of tricuspid stenosis. Aortic Valve: The aortic valve is tricuspid. Aortic valve regurgitation is mild to moderate. Aortic regurgitation PHT measures 355 msec. Mild aortic valve sclerosis is present, with no evidence of aortic valve stenosis. Pulmonic Valve: The pulmonic valve was normal in structure. Pulmonic valve regurgitation is mild. No evidence of pulmonic stenosis. Aorta: The aortic root is normal in size and  structure. Venous: The inferior vena cava is dilated in size with greater than 50% respiratory variability, suggesting right atrial pressure of 8 mmHg. IAS/Shunts: No atrial level shunt detected by color flow Doppler.  LEFT VENTRICLE PLAX 2D LVIDd:         6.10 cm  Diastology LVIDs:         5.00 cm  LV e' medial:    5.22 cm/s LV PW:         1.20 cm  LV E/e' medial:  26.8 LV IVS:        1.20 cm  LV e' lateral:   8.81 cm/s LVOT diam:     2.30 cm  LV E/e' lateral: 15.9 LV SV:         53 LV SV Index:   28 LVOT Area:     4.15 cm  RIGHT VENTRICLE RV Basal diam:  3.40 cm RV S prime:     8.16 cm/s TAPSE (M-mode): 2.5 cm LEFT ATRIUM              Index       RIGHT ATRIUM           Index LA diam:        4.60 cm  2.44 cm/m  RA Area:     32.10 cm LA Vol (A2C):   115.0 ml 60.93 ml/m RA Volume:   130.00 ml 68.88 ml/m LA Vol (A4C):   78.6 ml  41.65 ml/m LA Biplane Vol: 96.4 ml  51.08 ml/m  AORTIC VALVE LVOT Vmax:   87.70 cm/s LVOT Vmean:  46.800 cm/s LVOT VTI:    0.128 m AI PHT:      355 msec  AORTA Ao Root diam: 3.10 cm MITRAL VALVE                TRICUSPID VALVE MV Area (PHT): 4.06 cm     TR Peak grad:   65.0 mmHg MV Decel Time: 187 msec     TR Vmax:        403.00 cm/s MV E velocity:  140.00 cm/s MV A velocity: 79.60 cm/s   SHUNTS MV E/A ratio:  1.76         Systemic VTI:  0.13 m                             Systemic Diam: 2.30 cm Jenkins Rouge MD Electronically signed by Jenkins Rouge MD Signature Date/Time: 12/28/2019/9:22:03 AM    Final      Medications:     Scheduled Medications:  apixaban  5 mg Oral BID   atorvastatin  20 mg Oral QPM   Chlorhexidine Gluconate Cloth  6 each Topical Daily   furosemide  80 mg Intravenous BID   gabapentin  300 mg Oral BID   hydrOXYzine  25 mg Oral QHS   pantoprazole  40 mg Oral Daily   ranolazine  500 mg Oral BID   sertraline  25 mg Oral Daily   sodium chloride flush  10-40 mL Intracatheter Q12H    Infusions:  amiodarone 60 mg/hr (12/29/19 0500)   milrinone 0.25 mcg/kg/min (12/29/19 0500)   norepinephrine (LEVOPHED) Adult infusion Stopped (12/29/19 0456)    PRN Medications: acetaminophen, calcium carbonate, hydrOXYzine, ondansetron (ZOFRAN) IV, sodium chloride flush    Patient Profile  69 y.o.malewith a hx of chronic systolic HF 2/2 NICM s/pBSciICD, persistent atrial fibrillation, h/o stroke (Lg L MCA infarct w/ hemorrhagic conversionin 2018),  HTN, HCV, tobacco abuse (smokes ~1/4 ppd),  and chronic cholecystitiswith achronic perc drain.   Admitted with ADHF. Started on milrinone.    Assessment/Plan   1. Acute on chronic systolic HF due to NICM -> cardiogenic shock - suspect AF cardiomyopathy --[facility] 10/2018 showed normal cors, elevated filling pressures and low CI at 1.9 - TTE 11/2018 LVEF 20%, mild RV dysfunction - TEE 5/21 EF 20-25% with moderate to severe RV dysfunction.  - admit 06/2019 with severe HF, required IABP and milrinone. - Admitted with ADHF. Milrinone 0.25 mcg started 11/7 due to low output HF.  - Echo 12/28/19 EF 20-25% RV mildly decreased  - Moved to ICU on 11/8 due to persistent low co-ox on milrinone - Co-ox 47% on NE 2 and milrinone 0.25. Will increase milrinone to 0.375. Follow  co-ox.  May need to add Bidil. - CVP remains high though does not appear markedly fluid overloaded. Suspect RV function worse than it looks on echo.. Will switch to lasix gtt at 20 -  has not been on beta blockers due to low output HF - No dig or ACE/ARB/ARNI with worsening renal function.  - Options limited. If we can get co-ox a bit higher will proceed with TEE/DC-CV and try to get him back to NSR - poor LVAD candidate with noncompliance and renal failure. He did not make one visit to HF or EP clinic after dramatic hospitalization for AF and cardiogenic shock in 5/21  2.  Persistent Afib with RVR - remains in AF. Now on amio gtt. Rate 100-115.  - In 5/21 had refractory AF requiring multiple DC-CVs, high-dose amio and ranexa. He reports intermittent compliance with amio and Eliquis. He says he has just missed a few doses .  - prior TEE/DCCV 06/2019 - has missed some eliquis doses, would required TEE prior to repeat DCCV. Says he was out eliquis prior to admit.  - Continue eliquis. (May need to switch to heparin if further procedures needed) - continue ranexa  3. Acute on CKD IIIb - baseline Cr looks to be around 1.6 to 1.7 - admission Cr 1.69-->2.5. Likely ATN/cardiorenal - needs BMET this am - cotninue  4. Elevated troponin - 10/2018 cath clean coronaries - elevated trops due to afib with RVR, CHF  5. Chronic cholecystitis with perc drain -  Per primary team. Perc drain now fell out  6. Iron deficiency anemia - Iron sats low.  - Has received feraheme  7. Depression - Watch QT closely on ranexa, amio and sertraline. Low threshold to stop sertraline    CRITICAL CARE Performed by: Glori Bickers  Total critical care time: 35 minutes  Critical care time was exclusive of separately billable procedures and treating other patients.  Critical care was necessary to treat or prevent imminent or life-threatening deterioration.  Critical care was time spent personally by me  (independent of midlevel providers or residents) on the following activities: development of treatment plan with patient and/or surrogate as well as nursing, discussions with consultants, evaluation of patient's response to treatment, examination of patient, obtaining history from patient or surrogate, ordering and performing treatments and interventions, ordering and review of laboratory studies, ordering and review of radiographic studies, pulse oximetry and re-evaluation of patient's condition.    Advanced Heart Failure Team Pager [phone] (M-F; [place])  Please contact [facility] for night-coverage after hours (4p -7a ) and weekends on [email or web address]  Glori Bickers MD 6:28 AM  Length of Stay: 3   6:28 AM

## 2019-12-29 NOTE — Progress Notes (Signed)
FPTS Interim Progress Note  Spoke with surgery regarding lost bili drain.  He is not a candidate for Surgery and they do not feel he needs drain replaced while in hospital.  Recommend f/u outpatient.  Appreciate recs.   Delora Fuel, MD 12/29/2019, 11:41 AM PGY-1, [place] [facility] Service pager [phone]

## 2019-12-29 NOTE — Progress Notes (Addendum)
[facility] for heparin Indication: atrial fibrillation  Allergies  Allergen Reactions  . Benadryl [Diphenhydramine] Palpitations    Patient Measurements: Height: 5\' 7"  (170.2 cm) Weight: 77.9 kg (171 lb 11.8 oz) IBW/kg (Calculated) : 66.1 Heparin Dosing Weight: 78 kg  Vital Signs: Temp: 98 F (36.7 C) (11/09 0700) Temp Source: Oral (11/09 0700) BP: 101/74 (11/09 1215) Pulse Rate: 127 (11/09 1337)  Labs: Recent Labs    12/27/19 0429 12/27/19 0429 12/28/19 0536 12/28/19 1200 12/29/19 0535  HGB 9.2*   < > 8.6*  --  8.8*  HCT 28.7*  --  27.2*  --  28.5*  PLT 199  --  199  --  209  CREATININE 2.10*   < > 2.47* 2.44* 2.51*   < > = values in this interval not displayed.    Estimated Creatinine Clearance: 26.7 mL/min (A) (by C-G formula based on SCr of 2.51 mg/dL (H)).   Medical History: Past Medical History:  Diagnosis Date  . Acute blood loss anemia   . Acute CVA (cerebrovascular accident) ([place])   . Acute deep vein thrombosis (DVT) of both lower extremities ([name])   . Acute kidney injury ([place])   . Acute on chronic combined systolic and diastolic CHF (congestive heart failure) ([place])   . Acute renal failure superimposed on stage 3a chronic kidney disease ([place])   . Acute respiratory failure with hypoxia ([place])   . Atrial fibrillation ([place])   . Carpal tunnel syndrome of right wrist 02/28/2018  . Cerebral edema ([place]) 11/13/2018  . Cerebral infarction ([place])   . CHF (congestive heart failure) ([place])   . Cholecystitis 02/04/2019  . Chronic right hip pain   . DCM (dilated cardiomyopathy) ([place])   . Dysphagia, post-stroke   . Elevated troponin   . Entrapment of right ulnar nerve 02/28/2018  . Headache due to intracranial disease 11/14/2018  . Hepatitis C   . History of hemorrhagic stroke with residual hemiparesis ([place]) 02/04/2019  . HTN (hypertension) 08/14/2016  . Hyperlipidemia LDL goal <70 11/13/2018  . Hypertension   . ICD (implantable  cardioverter-defibrillator) in place 09/13/2016  . Impotence due to erectile dysfunction 09/30/2017  . Ischemic cardiomyopathy   . Labile blood glucose   . Left leg DVT ([place]) 02/04/2019  . Marijuana user 11/13/2018  . Paroxysmal atrial fibrillation ([name])   . Right middle cerebral artery stroke ([place]) 11/13/2018  . Solitary pulmonary nodule 06/10/2017   5 mm RUL nodule noted incidentally as part of CVA workup 08/2016. With smoking history would obtain low-dose CT scan 08/2017.   . Stroke (cerebrum) ([name]) Lg L MCA infarct w/ hemorrhagic conversion, embolic d/t AF 06/02/2438  . Stroke ([place])   . Trochanteric bursitis, right hip 11/14/2018  . Visit for monitoring Tikosyn therapy 03/26/2017     Assessment: 49 yoM admitted with ADHF and AFib now with worsening cardiogenic shock. Pt on apixaban for AFib (last dose 0900 11/9), now to transition to IV heparin in anticipation of invasive procedures.  Goal of Therapy:  Heparin level 0.3-0.7 units/ml aPTT 66-102 seconds Monitor platelets by anticoagulation protocol: Yes   Plan:  -Hold apixaban -Start heparin no bolus 1200 units/h at 2100 tonight -Check aPTT in 8h -Daily aPTT and heparin level   Arrie Senate, PharmD, BCPS Clinical Pharmacist [phone] Please check AMION for all [place] numbers 12/29/2019

## 2019-12-29 NOTE — Progress Notes (Signed)
Physical Therapy Treatment Patient Details Name: Glenn [name] MRN: [id number] DOB: 12-06-1950 Today's Date: 12/29/2019    History of Present Illness Glenn [name] is a 69 y.o. male with a hx of chronic systolic HF 2/2 NICM s/p BSci ICD, atrial fibrillation, h/o stroke (Lg L MCA infarct w/ hemorrhagic conversion in 2018), h/o DVT, chornic a/c therapy, HTN, HLD, pulmonary nodule, Hepatitis C, carpal tunnel,  tobacco abuse (smokes ~1/4 ppd) and chronic cholecystitis with a chronic perc drain, who is being seen today for the evaluation of Afib RVR and CHF at the request of Dr Regenia Skeeter.    PT Comments    Pt agreeable to ambulate during session; HR averaged around 113 with a slight increase to 127 but came back down, RN notified; pt requiring min guard during ambulation due to decreased weight on LLE and 1 LOB noted during ambulation; pt limited by fatigue; pt will benefit from skilled PT to address deficits in balance, strength, endurance, gait and safety to maximize independence with functional mobility prior to discharge.     Follow Up Recommendations  No PT follow up;Supervision - Intermittent     Equipment Recommendations  None recommended by PT    Recommendations for Other Services       Precautions / Restrictions Precautions Precautions: Fall;Other (comment) Precaution Comments: watch HR Restrictions Weight Bearing Restrictions: No    Mobility  Bed Mobility Overal bed mobility: Independent                Transfers Overall transfer level: Needs assistance   Transfers: Sit to/from Stand Sit to Stand: Supervision            Ambulation/Gait Ambulation/Gait assistance: Min guard Gait Distance (Feet): 140 Feet Assistive device: None Gait Pattern/deviations: Decreased stride length;Decreased dorsiflexion - left;Decreased step length - left;Step-to pattern Gait velocity: decreased   General Gait Details: pt requiring min guard with 1 LOB noted to  L   Stairs             Wheelchair Mobility    Modified Rankin (Stroke Patients Only)       Balance                                            Cognition                                              Exercises      General Comments        Pertinent Vitals/Pain Pain Assessment: No/denies pain    Home Living                      Prior Function            PT Goals (current goals can now be found in the care plan section) Acute Rehab PT Goals Patient Stated Goal: 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) PT Goal Formulation: With patient Time For Goal Achievement: 01/10/20 Potential to Achieve Goals: Good Progress towards PT goals: Progressing toward goals    Frequency    Min 3X/week      PT Plan Current plan remains appropriate    Co-evaluation  AM-PAC PT "6 Clicks" Mobility   Outcome Measure  Help needed turning from your back to your side while in a flat bed without using bedrails?: None Help needed moving from lying on your back to sitting on the side of a flat bed without using bedrails?: None Help needed moving to and from a bed to a chair (including a wheelchair)?: A Little Help needed standing up from a chair using your arms (e.g., wheelchair or bedside chair)?: None Help needed to walk in hospital room?: A Little Help needed climbing 3-5 steps with a railing? : A Little 6 Click Score: 21    End of Session Equipment Utilized During Treatment: Gait belt Activity Tolerance: Patient tolerated treatment well Patient left: with call bell/phone within reach;in chair Nurse Communication: Mobility status PT Visit Diagnosis: Difficulty in walking, not elsewhere classified (R26.2)     Time: 9024-0973 PT Time Calculation (min) (ACUTE ONLY): 16 min  Charges:  $Gait Training: 8-22 mins                     Lyanne Co, DPT Acute Rehabilitation  Services [phone]   [place] 12/29/2019, 1:39 PM

## 2019-12-29 NOTE — Plan of Care (Signed)
  Problem: Activity: Goal: Ability to tolerate increased activity will improve Outcome: Progressing   Problem: Nutrition: Goal: Adequate nutrition will be maintained Outcome: Progressing   Problem: Coping: Goal: Level of anxiety will decrease Outcome: Progressing   Problem: Pain Managment: Goal: General experience of comfort will improve Outcome: Progressing

## 2019-12-30 ENCOUNTER — Inpatient Hospital Stay (HOSPITAL_COMMUNITY): Payer: Medicare HMO

## 2019-12-30 ENCOUNTER — Encounter (HOSPITAL_COMMUNITY): Payer: Self-pay | Admitting: Anesthesiology

## 2019-12-30 ENCOUNTER — Encounter (HOSPITAL_COMMUNITY)
Admission: EM | Disposition: A | Discharge status: Home Health | Payer: Self-pay | Source: Home / Self Care | Attending: Internal Medicine

## 2019-12-30 DIAGNOSIS — R570 Cardiogenic shock: Secondary | ICD-10-CM

## 2019-12-30 DIAGNOSIS — I5023 Acute on chronic systolic (congestive) heart failure: Secondary | ICD-10-CM | POA: Diagnosis not present

## 2019-12-30 DIAGNOSIS — I4819 Other persistent atrial fibrillation: Secondary | ICD-10-CM | POA: Diagnosis not present

## 2019-12-30 DIAGNOSIS — I639 Cerebral infarction, unspecified: Secondary | ICD-10-CM

## 2019-12-30 HISTORY — PX: TEE WITHOUT CARDIOVERSION: SHX5443

## 2019-12-30 HISTORY — PX: CARDIOVERSION: SHX1299

## 2019-12-30 LAB — APTT
aPTT: 46 seconds — ABNORMAL HIGH (ref 24–36)
aPTT: 49 seconds — ABNORMAL HIGH (ref 24–36)

## 2019-12-30 LAB — BASIC METABOLIC PANEL
Anion gap: 11 (ref 5–15)
BUN: 23 mg/dL (ref 8–23)
CO2: 29 mmol/L (ref 22–32)
Calcium: 9.1 mg/dL (ref 8.9–10.3)
Chloride: 92 mmol/L — ABNORMAL LOW (ref 98–111)
Creatinine, Ser: 2.31 mg/dL — ABNORMAL HIGH (ref 0.61–1.24)
GFR, Estimated: 30 mL/min — ABNORMAL LOW (ref >60)
Glucose, Bld: 129 mg/dL — ABNORMAL HIGH (ref 70–99)
Potassium: 3.3 mmol/L — ABNORMAL LOW (ref 3.5–5.1)
Sodium: 132 mmol/L — ABNORMAL LOW (ref 135–145)

## 2019-12-30 LAB — GLUCOSE, CAPILLARY: Glucose-Capillary: 107 mg/dL — ABNORMAL HIGH (ref 70–99)

## 2019-12-30 LAB — CBC
HCT: 30.1 % — ABNORMAL LOW (ref 39.0–52.0)
Hemoglobin: 9.5 g/dL — ABNORMAL LOW (ref 13.0–17.0)
MCH: 28.1 pg (ref 26.0–34.0)
MCHC: 31.6 g/dL (ref 30.0–36.0)
MCV: 89.1 fL (ref 80.0–100.0)
Platelets: 215 10*3/uL (ref 150–400)
RBC: 3.38 MIL/uL — ABNORMAL LOW (ref 4.22–5.81)
RDW: 15.2 % (ref 11.5–15.5)
WBC: 6 10*3/uL (ref 4.0–10.5)
nRBC: 0.3 % — ABNORMAL HIGH (ref 0.0–0.2)

## 2019-12-30 LAB — COOXEMETRY PANEL
Carboxyhemoglobin: 1.3 % (ref 0.5–1.5)
Methemoglobin: 0.8 % (ref 0.0–1.5)
O2 Saturation: 36 %
Total hemoglobin: 10.2 g/dL — ABNORMAL LOW (ref 12.0–16.0)

## 2019-12-30 LAB — PROTIME-INR
INR: 1.5 — ABNORMAL HIGH (ref 0.8–1.2)
INR: 1.4 — ABNORMAL HIGH (ref 0.8–1.2)
Prothrombin Time: 17.4 seconds — ABNORMAL HIGH (ref 11.4–15.2)
Prothrombin Time: 16.5 seconds — ABNORMAL HIGH (ref 11.4–15.2)

## 2019-12-30 LAB — HEPARIN LEVEL (UNFRACTIONATED): Heparin Unfractionated: 1.6 IU/mL — ABNORMAL HIGH (ref 0.30–0.70)

## 2019-12-30 LAB — MAGNESIUM: Magnesium: 1.7 mg/dL (ref 1.7–2.4)

## 2019-12-30 SURGERY — CARDIOVERSION
Anesthesia: Choice

## 2019-12-30 MED ORDER — SODIUM CHLORIDE 0.9 % IV SOLN
INTRAVENOUS | Status: DC
  Administered 2019-12-30: 5 mL/h via INTRAVENOUS

## 2019-12-30 MED ORDER — MAGNESIUM SULFATE 2 GM/50ML IV SOLN
2.0000 g | Freq: Once | INTRAVENOUS | Status: AC
  Administered 2019-12-30: 2 g via INTRAVENOUS
  Filled 2019-12-30: qty 50

## 2019-12-30 MED ORDER — POTASSIUM CHLORIDE CRYS ER 20 MEQ PO TBCR: 40.0000 meq | EXTENDED_RELEASE_TABLET | Freq: Once | ORAL | Status: DC

## 2019-12-30 MED ORDER — POTASSIUM CHLORIDE CRYS ER 20 MEQ PO TBCR
40.0000 meq | EXTENDED_RELEASE_TABLET | Freq: Every day | ORAL | Status: DC
  Administered 2019-12-30 – 2020-01-03 (×5): 40 meq via ORAL
  Filled 2019-12-30 (×5): qty 2

## 2019-12-30 NOTE — Code Documentation (Signed)
Stroke Response Nurse Documentation Code Documentation  Glenn [name] is a 69 y.o. male admitted to [facility] ED on 11/05  with past medical hx of congestive heart failure, Afib RVR, and shortness of breath. Cardiology admitted. Pt was admitted to the ICU where he was started on amiodarone and Lasix. Continued on Eliquis. Yesterday, pt was transitioned to Heparin for potential TEE today. At 1230, the RN noted patient at his baseline. Reasssessment at 1400 noted to have left sided arm weakness, left facial droop, and slurred speech.   Code stroke was activated by [facility] RN.  On heparin IV. Stroke team at the bedside on the floor and transferred patient to CT. NIHSS 4, see documentation for details and code stroke times. Patient with left facial droop, left arm weakness and dysarthria  on exam. The following imaging was completed:  CT. Patient is not a candidate for tPA due to being on Eliquis yesterday and Heparin today. Care/Plan: q2 mNIHSS/VS x 12 hours and then q4. Bedside handoff with Marcene Brawn, RN 2 Heart.    Kathrin Greathouse  Stroke Response RN

## 2019-12-30 NOTE — Anesthesia Preprocedure Evaluation (Deleted)
Anesthesia Evaluation    Airway        Dental   Pulmonary COPD,  COPD inhaler, Patient abstained from smoking., former smoker,           Cardiovascular hypertension, +CHF  + dysrhythmias Atrial Fibrillation   12/28/2019 ECHO: EF 20-25%, severely decreased LVF, Grade II diastolic dysfunction, mild MR, severe TR, mild-mod AI   Neuro/Psych Anxiety CVA    GI/Hepatic GERD  ,Chronic cholecystitis   Endo/Other    Renal/GU Renal InsufficiencyRenal disease     Musculoskeletal   Abdominal   Peds  Hematology eliquis   Anesthesia Other Findings   Reproductive/Obstetrics                             Anesthesia Physical Anesthesia Plan  ASA: IV  Anesthesia Plan: MAC   Post-op Pain Management:    Induction:   PONV Risk Score and Plan: 1 and Ondansetron and Treatment may vary due to age or medical condition  Airway Management Planned: Nasal Cannula and Natural Airway  Additional Equipment:   Intra-op Plan:   Post-operative Plan:   Informed Consent:   Plan Discussed with:   Anesthesia Plan Comments:         Anesthesia Quick Evaluation

## 2019-12-30 NOTE — Progress Notes (Signed)
RN paged around 14:00 regarding change facial symmetry, LUE weakness and dysarthria.  Code Stroke called.    LKN 12:30.   History of L MCA ischemic CVA with conversion to hemorrhagic CVA in 2018.    On arrival to room around 14:00, L facial droop noted with cool LUE 4/5 strength and dysmetria with L finger to nose examination.  Heel to shin intact and BLE strength 5/5.   Pt only complaint with intermittent pain to LUE.  Denies headache, dizziness or visual changes.   Patient was taken to CT head w/o contrast which showed no hemorrhage or acute ischemia.  VS stable.  Remains on amio 60, heparin gtt for afib (INR 1.5, APTT 46), dobutamine 3 mcg, and lasix 20 gtt.    LUE weakness, dysarthria, L pronator drift and L facial droop symptoms waxing and waning.    Planned TEE DC-CV was canceled for today due to new onset of neuro symptoms.  Will a/w neuro for further recommendations.

## 2019-12-30 NOTE — Progress Notes (Signed)
Physical Therapy Treatment Patient Details Name: Glenn [name] MRN: [id number] DOB: [birth year] Today's Date: 12/30/2019    History of Present Illness JABER DUNLOW is a 69 y.o. male with a hx of chronic systolic HF 2/2 NICM s/p BSci ICD, atrial fibrillation, h/o stroke (Lg L MCA infarct w/ hemorrhagic conversion in 2018), h/o DVT, chornic a/c therapy, HTN, HLD, pulmonary nodule, Hepatitis C, carpal tunnel,  tobacco abuse (smokes ~1/4 ppd) and chronic cholecystitis with a chronic perc drain, who is being seen today for the evaluation of Afib RVR and CHF at the request of Dr Regenia Skeeter.    PT Comments    Pt refused OOB activities during session but agreeable to bed exercises to initiate HEP; pt able to perform with minimal rest breaks and able to follow verbal cueing and demonstration; pt complained about gait deficits and worried about gait when returning home, PT recommending HHPT following session due to fatigue as well as balance deficits noted during yesterday's session; pt will benefit from skilled PT to address deficits in balance, strength, gait, endurance and safety to maximize independence with functional mobility prior to discharge.     Follow Up Recommendations  Supervision - Intermittent;Home health PT     Equipment Recommendations  None recommended by PT    Recommendations for Other Services       Precautions / Restrictions      Mobility  Bed Mobility                  Transfers                    Ambulation/Gait                 Stairs             Wheelchair Mobility    Modified Rankin (Stroke Patients Only)       Balance                                            Cognition                                              Exercises General Exercises - Lower Extremity Ankle Circles/Pumps: AROM;Both;20 reps;Supine Short Arc Quad: AROM;Both;20 reps;Supine Heel Slides: AROM;Both;20  reps;Supine Hip ABduction/ADduction: AROM;Both;20 reps;Supine Straight Leg Raises: AROM;Both;20 reps;Supine    General Comments        Pertinent Vitals/Pain      Home Living                      Prior Function            PT Goals (current goals can now be found in the care plan section) Acute Rehab PT Goals Patient Stated Goal: 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) PT Goal Formulation: With patient Time For Goal Achievement: 01/10/20 Potential to Achieve Goals: Good Progress towards PT goals: Progressing toward goals    Frequency    Min 3X/week      PT Plan Current plan remains appropriate    Co-evaluation              AM-PAC PT "6 Clicks" Mobility   Outcome Measure  Help  needed turning from your back to your side while in a flat bed without using bedrails?: None Help needed moving from lying on your back to sitting on the side of a flat bed without using bedrails?: None Help needed moving to and from a bed to a chair (including a wheelchair)?: A Little Help needed standing up from a chair using your arms (e.g., wheelchair or bedside chair)?: None Help needed to walk in hospital room?: A Little Help needed climbing 3-5 steps with a railing? : A Little 6 Click Score: 21    End of Session   Activity Tolerance: Patient limited by fatigue Patient left: with call bell/phone within reach;in bed;with nursing/sitter in room Nurse Communication: Mobility status PT Visit Diagnosis: Difficulty in walking, not elsewhere classified (R26.2)     Time: 6606-3016 PT Time Calculation (min) (ACUTE ONLY): 13 min  Charges:  $Therapeutic Exercise: 8-22 mins                     Lyanne Co, DPT Acute Rehabilitation Services [phone]   [place] 12/30/2019, 1:13 PM

## 2019-12-30 NOTE — Progress Notes (Signed)
[facility] for heparin Indication: atrial fibrillation  Allergies  Allergen Reactions  . Benadryl [Diphenhydramine] Palpitations    Patient Measurements: Height: 5\' 7"  (170.2 cm) Weight: 77.6 kg (171 lb 1.2 oz) IBW/kg (Calculated) : 66.1 Heparin Dosing Weight: 78 kg  Vital Signs: Temp: 97.8 F (36.6 C) (11/10 1100) BP: 101/70 (11/10 1700) Pulse Rate: 90 (11/10 1700)  Labs: Recent Labs    12/28/19 0536 12/28/19 0536 12/28/19 1200 12/29/19 0535 12/30/19 0433 12/30/19 0813 12/30/19 0857 12/30/19 1743  HGB 8.6*   < >  --  8.8* 9.5*  --   --   --   HCT 27.2*  --   --  28.5* 30.1*  --   --   --   PLT 199  --   --  209 215  --   --   --   APTT  --   --   --   --   --  46*  --  49*  LABPROT  --   --   --   --   --   --  17.4* 16.5*  INR  --   --   --   --   --   --  1.5* 1.4*  HEPARINUNFRC  --   --   --   --   --   --   --  1.60*  CREATININE 2.47*   < > 2.44* 2.51* 2.31*  --   --   --    < > = values in this interval not displayed.    Estimated Creatinine Clearance: 29 mL/min (A) (by C-G formula based on SCr of 2.31 mg/dL (H)).   Medical History: Past Medical History:  Diagnosis Date  . Acute blood loss anemia   . Acute CVA (cerebrovascular accident) ([place])   . Acute deep vein thrombosis (DVT) of both lower extremities ([name])   . Acute kidney injury ([place])   . Acute on chronic combined systolic and diastolic CHF (congestive heart failure) ([place])   . Acute renal failure superimposed on stage 3a chronic kidney disease ([place])   . Acute respiratory failure with hypoxia ([place])   . Atrial fibrillation ([place])   . Carpal tunnel syndrome of right wrist 02/28/2018  . Cerebral edema ([place]) 11/13/2018  . Cerebral infarction ([place])   . CHF (congestive heart failure) ([place])   . Cholecystitis 02/04/2019  . Chronic right hip pain   . DCM (dilated cardiomyopathy) ([place])   . Dysphagia, post-stroke   . Elevated troponin   . Entrapment of right ulnar  nerve 02/28/2018  . Headache due to intracranial disease 11/14/2018  . Hepatitis C   . History of hemorrhagic stroke with residual hemiparesis ([place]) 02/04/2019  . HTN (hypertension) 08/14/2016  . Hyperlipidemia LDL goal <70 11/13/2018  . Hypertension   . ICD (implantable cardioverter-defibrillator) in place 09/13/2016  . Impotence due to erectile dysfunction 09/30/2017  . Ischemic cardiomyopathy   . Labile blood glucose   . Left leg DVT ([place]) 02/04/2019  . Marijuana user 11/13/2018  . Paroxysmal atrial fibrillation ([name])   . Right middle cerebral artery stroke ([place]) 11/13/2018  . Solitary pulmonary nodule 06/10/2017   5 mm RUL nodule noted incidentally as part of CVA workup 08/2016. With smoking history would obtain low-dose CT scan 08/2017.   . Stroke (cerebrum) ([name]) Lg L MCA infarct w/ hemorrhagic conversion, embolic d/t AF 2/99/2426  . Stroke ([place])   . Trochanteric bursitis, right hip 11/14/2018  . Visit  for monitoring Tikosyn therapy 03/26/2017     Assessment: 76 yoM admitted with ADHF and AFib now with worsening cardiogenic shock. Pt on apixaban for AFib (last dose 0900 11/9), now to transition to IV heparin in anticipation of invasive procedures.  Code stroke called earlier today, CT negative for bleed. Heparin resumed upon arrival to floor, per neuro continue for now. aPTT continues to be subtherapeutic at 49 seconds this evening, heparin levels still elevated d/t recent apixaban. CBC stable this am. No bleeding or infusion issues noted.   Goal of Therapy:  Heparin level 0.3-0.5 units/ml aPTT 66-85 seconds Monitor platelets by anticoagulation protocol: Yes   Plan: Increase heparin to 1600 units/hr Recheck aptt/heparin level in am  Erin Hearing PharmD., BCPS Clinical Pharmacist 12/30/2019 7:14 PM

## 2019-12-30 NOTE — Progress Notes (Addendum)
Code stroke activated, MD notified, CT scan complete, coordinated w/Neurology via phone pre-CT and F2F in CT (Exam complete- report given).   At 1400 - Patient in bed, NAD, resting, c/o LUE "pain" 1/10 and cool to touch upon assessment. Pulses 2+.  Upon further exam, NIHSS,  patient displayed LEFT facial droop, dysarthria, LUE ataxia and weakness on exam.   LKW: 1230 12/30/19 Marlis Edelson, RN. NAD. Initial exam, re-assessment and NIHSS  - Marlis Edelson, RN  Heparin gtt cont s/p exam AND CT, verified w/Neurology and HF team.

## 2019-12-30 NOTE — Progress Notes (Addendum)
[facility] for heparin Indication: atrial fibrillation  Allergies  Allergen Reactions  . Benadryl [Diphenhydramine] Palpitations    Patient Measurements: Height: 5\' 7"  (170.2 cm) Weight: 77.6 kg (171 lb 1.2 oz) IBW/kg (Calculated) : 66.1 Heparin Dosing Weight: 78 kg  Vital Signs: Temp: 97.7 F (36.5 C) (11/10 0700) Temp Source: Oral (11/10 0700) BP: 104/83 (11/10 0800) Pulse Rate: 87 (11/10 0800)  Labs: Recent Labs    12/28/19 0536 12/28/19 0536 12/28/19 1200 12/29/19 0535 12/30/19 0433 12/30/19 0813  HGB 8.6*   < >  --  8.8* 9.5*  --   HCT 27.2*  --   --  28.5* 30.1*  --   PLT 199  --   --  209 215  --   APTT  --   --   --   --   --  46*  CREATININE 2.47*   < > 2.44* 2.51* 2.31*  --    < > = values in this interval not displayed.    Estimated Creatinine Clearance: 29 mL/min (A) (by C-G formula based on SCr of 2.31 mg/dL (H)).   Medical History: Past Medical History:  Diagnosis Date  . Acute blood loss anemia   . Acute CVA (cerebrovascular accident) ([place])   . Acute deep vein thrombosis (DVT) of both lower extremities ([name])   . Acute kidney injury ([place])   . Acute on chronic combined systolic and diastolic CHF (congestive heart failure) ([place])   . Acute renal failure superimposed on stage 3a chronic kidney disease ([place])   . Acute respiratory failure with hypoxia ([place])   . Atrial fibrillation ([place])   . Carpal tunnel syndrome of right wrist 02/28/2018  . Cerebral edema ([place]) 11/13/2018  . Cerebral infarction ([place])   . CHF (congestive heart failure) ([place])   . Cholecystitis 02/04/2019  . Chronic right hip pain   . DCM (dilated cardiomyopathy) ([place])   . Dysphagia, post-stroke   . Elevated troponin   . Entrapment of right ulnar nerve 02/28/2018  . Headache due to intracranial disease 11/14/2018  . Hepatitis C   . History of hemorrhagic stroke with residual hemiparesis ([place]) 02/04/2019  . HTN (hypertension) 08/14/2016  .  Hyperlipidemia LDL goal <70 11/13/2018  . Hypertension   . ICD (implantable cardioverter-defibrillator) in place 09/13/2016  . Impotence due to erectile dysfunction 09/30/2017  . Ischemic cardiomyopathy   . Labile blood glucose   . Left leg DVT ([place]) 02/04/2019  . Marijuana user 11/13/2018  . Paroxysmal atrial fibrillation ([name])   . Right middle cerebral artery stroke ([place]) 11/13/2018  . Solitary pulmonary nodule 06/10/2017   5 mm RUL nodule noted incidentally as part of CVA workup 08/2016. With smoking history would obtain low-dose CT scan 08/2017.   . Stroke (cerebrum) ([name]) Lg L MCA infarct w/ hemorrhagic conversion, embolic d/t AF 9/51/8841  . Stroke ([place])   . Trochanteric bursitis, right hip 11/14/2018  . Visit for monitoring Tikosyn therapy 03/26/2017     Assessment: 6 yoM admitted with ADHF and AFib now with worsening cardiogenic shock. Pt on apixaban for AFib (last dose 0900 11/9), now to transition to IV heparin in anticipation of invasive procedures.  Initial aPTT is subtherapeutic at 46 seconds. CBC stable this am.  Goal of Therapy:  Heparin level 0.3-0.7 units/ml aPTT 66-102 seconds Monitor platelets by anticoagulation protocol: Yes   Plan:  -Increase heparin to 1450 units/h -Recheck aPTT in 8h   ADDENDUM: code stroke called, CT negative for bleed.  Heparin resumed upon arrival to floor, per neuro continue for now. Will adjust goals.  Plan: -Heparin resumed as above -Adjust goals to aPTT 66-85s / heparin level 0.3-0.5 units/ml -Check aPTT in 6h   Arrie Senate, PharmD, BCPS Clinical Pharmacist [phone] Please check AMION for all [place] numbers 12/30/2019

## 2019-12-30 NOTE — Consult Note (Addendum)
Neurology Consult H&P  CC: code stroke  History is obtained from: patient and team  HPI: Glenn [name] is a 69 y.o. male PMHx as below cardiology admitted to ICU where Eliquis Continued. Yesterday transitioned to heparin for potential TEE today and aptt this am 49 and dose increased in am per nursing. At 1230, the RN noted patient at his baseline but reassessment at 1400 noted to have left sided arm weakness, left facial droop, and slurred speech. Of note he has previous stroke with residual left sided deficits except facial droop.    LKW: see above tpa given?: No, see above IR Thrombectomy? No, see above Modified Rankin Scale: 1-No significant post stroke disability and can perform usual duties with stroke symptoms NIHSS: 4  ROS: A complete ROS was performed and is negative except as noted in the HPI.   Past Medical History:  Diagnosis Date  . Acute blood loss anemia   . Acute CVA (cerebrovascular accident) ([place])   . Acute deep vein thrombosis (DVT) of both lower extremities ([name])   . Acute kidney injury ([place])   . Acute on chronic combined systolic and diastolic CHF (congestive heart failure) ([place])   . Acute renal failure superimposed on stage 3a chronic kidney disease ([place])   . Acute respiratory failure with hypoxia ([place])   . Atrial fibrillation ([place])   . Carpal tunnel syndrome of right wrist 02/28/2018  . Cerebral edema ([place]) 11/13/2018  . Cerebral infarction ([place])   . CHF (congestive heart failure) ([place])   . Cholecystitis 02/04/2019  . Chronic right hip pain   . DCM (dilated cardiomyopathy) ([place])   . Dysphagia, post-stroke   . Elevated troponin   . Entrapment of right ulnar nerve 02/28/2018  . Headache due to intracranial disease 11/14/2018  . Hepatitis C   . History of hemorrhagic stroke with residual hemiparesis ([place]) 02/04/2019  . HTN (hypertension) 08/14/2016  . Hyperlipidemia LDL goal <70 11/13/2018  . Hypertension   . ICD (implantable cardioverter-defibrillator)  in place 09/13/2016  . Impotence due to erectile dysfunction 09/30/2017  . Ischemic cardiomyopathy   . Labile blood glucose   . Left leg DVT ([place]) 02/04/2019  . Marijuana user 11/13/2018  . Paroxysmal atrial fibrillation ([name])   . Right middle cerebral artery stroke ([place]) 11/13/2018  . Solitary pulmonary nodule 06/10/2017   5 mm RUL nodule noted incidentally as part of CVA workup 08/2016. With smoking history would obtain low-dose CT scan 08/2017.   . Stroke (cerebrum) ([name]) Lg L MCA infarct w/ hemorrhagic conversion, embolic d/t AF 05/18/5186  . Stroke ([place])   . Trochanteric bursitis, right hip 11/14/2018  . Visit for monitoring Tikosyn therapy 03/26/2017   Family History  Problem Relation Age of Onset  . High blood pressure Mother   . High blood pressure Father   . Stroke Maternal Aunt   . Heart disease Neg Hx     Social History:  reports that he has quit smoking. His smoking use included cigarettes. He smoked 0.50 packs per day. He has never used smokeless tobacco. He reports previous alcohol use of about 3.0 standard drinks of alcohol per week. He reports previous drug use. Frequency: 2.00 times per week. Drug: Marijuana.   Prior to Admission medications   Medication Sig Start Date End Date Taking? Authorizing Provider  acetaminophen (TYLENOL) 325 MG tablet Take 2 tablets (650 mg total) by mouth every 6 (six) hours as needed for mild pain (or Fever >/= 101). 07/16/19  Yes Clegg, Amy  D, NP  apixaban (ELIQUIS) 5 MG TABS tablet Take 1 tablet (5 mg total) by mouth 2 (two) times daily. 09/24/19  Yes [name], MD  atorvastatin (LIPITOR) 20 MG tablet TAKE 1 TABLET(20 MG) BY MOUTH DAILY AT 6 PM Patient taking differently: Take 20 mg by mouth every evening.  03/13/19  Yes [name], MD  calcium carbonate (TUMS - DOSED IN MG ELEMENTAL CALCIUM) 500 MG chewable tablet Chew 1 tablet (200 mg of elemental calcium total) by mouth daily as needed for indigestion or heartburn. 03/12/19  Yes  [name], MD  digoxin (LANOXIN) 0.125 MG tablet Take 1 tablet (0.125 mg total) by mouth daily. 08/11/19  Yes Baldwin [place], PA-C  docusate sodium (COLACE) 100 MG capsule Take 1 capsule (100 mg total) by mouth 2 (two) times daily. 07/16/19  Yes Clegg, Amy D, NP  furosemide (LASIX) 40 MG tablet Take 40 mg by mouth daily as needed for fluid or edema.  10/06/19  Yes [provider]  gabapentin (NEURONTIN) 300 MG capsule Take 2 capsules (600 mg total) by mouth 3 (three) times daily. Patient taking differently: Take 300 mg by mouth 2 (two) times daily.  09/11/19  Yes Autry-Lott, Naaman Plummer, DO  ondansetron (ZOFRAN) 4 MG tablet Take 1 tablet (4 mg total) by mouth every 6 (six) hours as needed for nausea. 07/16/19  Yes Clegg, Amy D, NP  pantoprazole (PROTONIX) 40 MG tablet Take 1 tablet (40 mg total) by mouth daily. 07/16/19  Yes Clegg, Amy D, NP  potassium chloride SA (KLOR-CON) 20 MEQ tablet TAKE 2 TABLETS BY MOUTH ONCE DAILY FOR LOW POTASSIUM LEVELS Patient taking differently: Take 20 mEq by mouth daily.  09/28/19  Yes [name], MD  spironolactone (ALDACTONE) 25 MG tablet Take 25 mg by mouth daily.   Yes [provider]  amiodarone (PACERONE) 200 MG tablet Take 1 tablet (200 mg total) by mouth every 8 (eight) hours. Patient not taking: Reported on 12/26/2019 07/16/19   Darrick Grinder D, NP  levalbuterol (XOPENEX) 0.63 MG/3ML nebulizer solution Take 3 mLs (0.63 mg total) by nebulization every 6 (six) hours as needed for wheezing or shortness of breath. Patient not taking: Reported on 12/26/2019 07/16/19   Darrick Grinder D, NP  metoCLOPramide (REGLAN) 5 MG tablet Take 1 tablet (5 mg total) by mouth 3 (three) times daily. Patient not taking: Reported on 12/26/2019 07/16/19   Darrick Grinder D, NP  ranolazine (RANEXA) 500 MG 12 hr tablet TAKE 1 TABLET BY MOUTH EVERY 12 HOURS FOR ATRIAL FIBRILLATION Patient not taking: Reported on 12/26/2019 09/28/19   [name], MD  sertraline (ZOLOFT) 25 MG tablet  Take 1 tablet (25 mg total) by mouth daily. Patient not taking: Reported on 12/26/2019 09/01/19   Leeanne Rio, MD  torsemide (DEMADEX) 20 MG tablet Take 2 tablets (40 mg total) by mouth 2 (two) times daily. Patient not taking: Reported on 12/26/2019 07/16/19   Darrick Grinder D, NP    Exam: Current vital signs: BP 97/71   Pulse 97   Temp 97.8 F (36.6 C)   Resp 13   Ht 5\' 7"  (1.702 m)   Wt 77.6 kg   SpO2 98%   BMI 26.79 kg/m   Physical Exam  Constitutional: Appears well-developed and well-nourished.  Psych: Affect appropriate to situation Eyes: No scleral injection HENT: No OP obstrucion Head: Normocephalic.  Cardiovascular: Normal rate and regular rhythm.  Respiratory: Effort normal and breath sounds normal to anterior ascultation GI: Soft.  No distension. There is no tenderness.  Skin: WDI  Neuro: Mental Status: Patient is awake, alert, oriented to person, place, month, year, and situation. Patient is able to give a clear and coherent history. No signs of aphasia or neglect. Cranial Nerves: II: Visual Fields are full. Pupils are equal, round, and reactive to light. III,IV, VI: EOMI without ptosis or diploplia.  V: Facial sensation is symmetric to temperature VII: Facial movement is asymmetric left droop.  VIII: hearing is intact to voice X: Uvula elevates symmetrically XI: Shoulder shrug is symmetric. XII: tongue is midline without atrophy or fasciculations.  Motor: Tone is normal. Bulk is normal. 4/5 left upper and lower. Sensory: Sensation is symmetric to light touch and temperature in the arms and legs. Deep Tendon Reflexes: 2+ and symmetric in the biceps and patellae. Plantars: Toes up on left. Cerebellar: FNF and HKS are intact bilaterally.  I have reviewed labs in epic and the pertinent results are: aPPT 49  I have reviewed the images obtained: NCT head showed no acute ischemic changes.  Assessment: Glenn [name] is a 70 y.o. male acute  worsening of residual left sided deficits which improved except for the face droop. Not a candidate for tPA see above.  Impression:  Acute ischemic stroke - rmca  Plan: - MRI brain without contrast. - Recommend vascular imaging with MRA head and neck. - Permissive hypertension first 24 h < 220/110. - May continue heparin. - Further recommendations for stroke service.  Electronically signed by: Dr. Lynnae Sandhoff Pager: [phone] 12/30/2019, 8:13 PM

## 2019-12-30 NOTE — Anesthesia Preprocedure Evaluation (Deleted)
Anesthesia Evaluation    Airway        Dental   Pulmonary Patient abstained from smoking., former smoker,           Cardiovascular hypertension, +CHF       Neuro/Psych    GI/Hepatic (+) Hepatitis -  Endo/Other    Renal/GU Renal disease     Musculoskeletal   Abdominal   Peds  Hematology   Anesthesia Other Findings   Reproductive/Obstetrics                             Anesthesia Physical Anesthesia Plan  ASA: III  Anesthesia Plan: General   Post-op Pain Management:    Induction:   PONV Risk Score and Plan: Ondansetron  Airway Management Planned:   Additional Equipment:   Intra-op Plan:   Post-operative Plan:   Informed Consent:   Plan Discussed with: Anesthesiologist  Anesthesia Plan Comments:         Anesthesia Quick Evaluation

## 2019-12-30 NOTE — Progress Notes (Addendum)
Advanced Heart Failure Rounding Note  PCP-Cardiologist: Virl Axe, MD   Subjective:    PICC placed 11/6 with initial CO-OX 31% and started Milrinone 0.25. On 11/8 txf ICU due to Co-ox remaining 30-40% on milrinone & started NE.    Yesterday: Co-ox remained less than 40% on Milrinone 0.5 and NE.  Stopped milrinone and NE d/t persistent low co-ox and changed to dobutamine 2.5.  Repeat co-ox with minimal increased.  Transitioned from eliquis to heparin gtt for possible procedure.  Patient adamant wants continue aggressive care.     Today: Co-ox: 36% on dobutamine 3 mcg with CVP 19; denies chest pain or shortness of breath. Remains in AF on IV amio 60 and heparin gtt.    Objective:   Weight Range: 77.6 kg Body mass index is 26.79 kg/m.   Vital Signs:   Temp:  [97.9 F (36.6 C)-98.4 F (36.9 C)] 98.2 F (36.8 C) (11/10 0400) Pulse Rate:  [27-127] 92 (11/10 0600) Resp:  [13-29] 14 (11/10 0600) BP: (90-157)/(43-117) 114/88 (11/10 0600) SpO2:  [57 %-100 %] 96 % (11/10 0600) Weight:  [77.6 kg] 77.6 kg (11/10 0444) Last BM Date: 12/27/19  Weight change: Filed Weights   12/28/19 0420 12/29/19 0544 12/30/19 0444  Weight: 77.1 kg 77.9 kg 77.6 kg    Intake/Output:   Intake/Output Summary (Last 24 hours) at 12/30/2019 0644 Last data filed at 12/30/2019 0600 Gross per 24 hour  Intake 1639.38 ml  Output 3000 ml  Net -1360.62 ml      Physical Exam   CVP 19 General:  Well appearing. No resp difficulty HEENT: normal Neck: supple. +HJR. Carotids 2+ bilat; no bruits.  Cor: PMI nondisplaced. Irregular rate & rhythm with murmur. No rubs, or gallops. Lungs: Clear upper lobes, diminished posterior bases.  Abdomen: soft, nontender, nondistended. No hepatosplenomegaly. No bruits or masses. Good bowel sounds. Extremities: no cyanosis, clubbing, rash, edema Neuro: alert & orientedx3, moves all 4 extremities w/o difficulty. Affect pleasant   Telemetry   Afib on monitor with  rates 80-110s.  Personally reviewed.   Labs    CBC Recent Labs    12/29/19 0535 12/30/19 0433  WBC 5.0 6.0  HGB 8.8* 9.5*  HCT 28.5* 30.1*  MCV 90.8 89.1  PLT 209 962   Basic Metabolic Panel Recent Labs    12/29/19 0535 12/30/19 0433  NA 134* 132*  K 3.5 3.3*  CL 97* 92*  CO2 27 29  GLUCOSE 149* 129*  BUN 25* 23  CREATININE 2.51* 2.31*  CALCIUM 8.9 9.1  MG  --  1.7   Liver Function Tests No results for input(s): AST, ALT, ALKPHOS, BILITOT, PROT, ALBUMIN in the last 72 hours. No results for input(s): LIPASE, AMYLASE in the last 72 hours. Cardiac Enzymes No results for input(s): CKTOTAL, CKMB, CKMBINDEX, TROPONINI in the last 72 hours.  BNP: BNP (last 3 results) Recent Labs    06/01/19 1413 06/23/19 1527 12/25/19 1757  BNP 531.4* 613.2* 776.7*    ProBNP (last 3 results) No results for input(s): PROBNP in the last 8760 hours.   D-Dimer No results for input(s): DDIMER in the last 72 hours. Hemoglobin A1C No results for input(s): HGBA1C in the last 72 hours. Fasting Lipid Panel No results for input(s): CHOL, HDL, LDLCALC, TRIG, CHOLHDL, LDLDIRECT in the last 72 hours. Thyroid Function Tests No results for input(s): TSH, T4TOTAL, T3FREE, THYROIDAB in the last 72 hours.  Invalid input(s): FREET3  Other results:   Imaging    No  results found.   Medications:     Scheduled Medications: . atorvastatin  20 mg Oral QPM  . Chlorhexidine Gluconate Cloth  6 each Topical Daily  . gabapentin  300 mg Oral BID  . hydrOXYzine  25 mg Oral QHS  . pantoprazole  40 mg Oral Daily  . sertraline  25 mg Oral Daily  . sodium chloride flush  10-40 mL Intracatheter Q12H    Infusions: . amiodarone 60 mg/hr (12/30/19 0600)  . DOBUTamine 3 mcg/kg/min (12/30/19 0600)  . furosemide (LASIX) 200 mg in dextrose 5% 100 mL (2mg /mL) infusion 20 mg/hr (12/30/19 0600)  . heparin 1,200 Units/hr (12/30/19 0600)  . norepinephrine (LEVOPHED) Adult infusion Stopped (12/29/19  1021)    PRN Medications: acetaminophen, calcium carbonate, hydrOXYzine, ondansetron (ZOFRAN) IV, sodium chloride flush    Patient Profile  69 y.o.malewith a hx of chronic systolic HF 2/2 NICM s/pBSciICD, persistent atrial fibrillation, h/o stroke (Lg L MCA infarct w/ hemorrhagic conversionin 2018), HTN, HCV, tobacco abuse (smokes ~1/4 ppd),  and chronic cholecystitiswith achronic perc drain.   Admitted with ADHF. Started on milrinone.    Assessment/Plan   1. Acute on chronic systolic HF due to NICM -> cardiogenic shock - suspect AF cardiomyopathy --[facility] 10/2018 showed normal cors, elevated filling pressures and low CI at 1.9 - TTE 11/2018 LVEF 20%, mild RV dysfunction - TEE 5/21 EF 20-25% with moderate to severe RV dysfunction.  - Admit 06/2019 with severe HF, required IABP and milrinone. - Admitted 12/25/2019 with ADHF. Milrinone 0.25 mcg started 11/7 due to low output HF.  - Echo 12/28/19 EF 20-25% RV mildly decreased  - Moved to ICU on 11/8 due to persistently low co-ox on milrinone. - 12/29/19: Continued w/ persistently low co-ox despite milrinone 0.5 and NE which was stopped d/t elevated MAPs. Gave metolazone 2/2 low UOP. Transitioned to dobutamine gtt starting at 2.5 mcg then increased to 3 mcg.  - CVP remains high at 19. Suspect RV function worse than it looks on echo.  - Continue with lasix gtt at 20 and dobutamine at 3.  Made NPO for now d/t possible procedure.  - Replete potassium and magnesium.  - No beta blockers due to low output HF - No dig or ACE/ARB/ARNI with worsening renal function.  - Options limited. If we can get co-ox a bit higher will proceed with TEE/DC-CV and try to get him back to NSR - poor LVAD candidate with noncompliance and renal failure. He did not make one visit to HF or EP clinic after dramatic hospitalization for AF and cardiogenic shock in 5/21  2.  Persistent Afib with RVR - remains in AF with rates 100-115.  - In 5/21 had refractory AF  requiring multiple DC-CVs, high-dose amio and ranexa. He reported intermittent compliance with amio and Eliquis. Out of eliquis prior to admission.  - Stopped ranexa on 11/9. - prior TEE/DCCV 06/2019 - has missed some eliquis doses, would required TEE prior to repeat DCCV.  - Transitioned from eliquis to heparin gtt due to possible need for procedures on 11/9.   3. Acute on CKD IIIb - Baseline Cr looks to be around 1.6 to 1.7 - Admission Cr 1.69-->2.31 - Peak Cr 2.51 - Likely ATN/cardiorenal - Continue to monitor BMET    4. Elevated troponin - 10/2018 cath clean coronaries - elevated trops due to afib with RVR, CHF  5. Chronic cholecystitis with perc drain -  Per primary team. Perc drain now fell out -  Will follow up outpatient  6. Iron deficiency anemia - Iron sats low.  - Has received feraheme  7. Depression - Watch QT closely on amio and sertraline. Low threshold to stop sertraline.  Ranexa stopped yesterday.    Advanced Heart Failure Team Pager [phone] (M-F; [place])  Please contact [facility] for night-coverage after hours (4p -7a ) and weekends on [email or web address]  Carlene Coria NP 6:44 AM  Length of Stay: 4   6:44 AM  Agree with above.  Remains extremely tenuous with markedly low co-ox despite dual inotropes. That said, he denies SOB and is making urine. Creatinine slightly better. Remains in AF on IV amio and heparin. CVP 18. Co-ox 36%  General:  Lying flat in bed. No resp difficulty HEENT: normal Neck: supple. JVP to ear Carotids 2+ bilat; no bruits. No lymphadenopathy or thryomegaly appreciated. Cor: PMI nondisplaced. Irregular rate & rhythm.+ s3 Lungs: clear Abdomen: soft, nontender, nondistended. No hepatosplenomegaly. No bruits or masses. Good bowel sounds. Extremities: no cyanosis, clubbing, rash, edema Neuro: alert & orientedx3, cranial nerves grossly intact. moves all 4 extremities w/o difficulty. Affect pleasant  Very difficult situation.  CO-ox suggests markedly depressed CO despite dual inotropes. This is very similar to his presentation in 5/21. We discussed option of going to cath lab for swan and possible IABP for further stabilization pre-DC-CV of proceeding directly to DC-CV to see if restoring NSR will help him. He strongly prefers to avoid swan and IABP (like he had in 5/21) if possible. He understands the risk of decompensating post DC-CV. Will plan TEE/DC-CV at bedside later today in ICU. Currently not candidate for durable advanced therapies with renal failure and lack of f/u and compliance previously.   CRITICAL CARE Performed by: Glori Bickers  Total critical care time: 45 minutes  Critical care time was exclusive of separately billable procedures and treating other patients.  Critical care was necessary to treat or prevent imminent or life-threatening deterioration.  Critical care was time spent personally by me (independent of midlevel providers or residents) on the following activities: development of treatment plan with patient and/or surrogate as well as nursing, discussions with consultants, evaluation of patient's response to treatment, examination of patient, obtaining history from patient or surrogate, ordering and performing treatments and interventions, ordering and review of laboratory studies, ordering and review of radiographic studies, pulse oximetry and re-evaluation of patient's condition.  Glori Bickers, MD  8:40 AM

## 2019-12-31 ENCOUNTER — Encounter (HOSPITAL_COMMUNITY)
Admission: EM | Disposition: A | Discharge status: Home Health | Payer: Self-pay | Source: Home / Self Care | Attending: Internal Medicine

## 2019-12-31 ENCOUNTER — Inpatient Hospital Stay (HOSPITAL_COMMUNITY): Payer: Medicare HMO

## 2019-12-31 ENCOUNTER — Ambulatory Visit: Payer: Medicare HMO

## 2019-12-31 ENCOUNTER — Encounter (HOSPITAL_COMMUNITY): Payer: Self-pay | Admitting: Internal Medicine

## 2019-12-31 DIAGNOSIS — R570 Cardiogenic shock: Secondary | ICD-10-CM | POA: Diagnosis not present

## 2019-12-31 DIAGNOSIS — I5023 Acute on chronic systolic (congestive) heart failure: Secondary | ICD-10-CM | POA: Diagnosis not present

## 2019-12-31 DIAGNOSIS — I639 Cerebral infarction, unspecified: Secondary | ICD-10-CM | POA: Diagnosis not present

## 2019-12-31 DIAGNOSIS — I4819 Other persistent atrial fibrillation: Secondary | ICD-10-CM | POA: Diagnosis not present

## 2019-12-31 LAB — CBC
HCT: 29.9 % — ABNORMAL LOW (ref 39.0–52.0)
Hemoglobin: 9.4 g/dL — ABNORMAL LOW (ref 13.0–17.0)
MCH: 27.6 pg (ref 26.0–34.0)
MCHC: 31.4 g/dL (ref 30.0–36.0)
MCV: 87.7 fL (ref 80.0–100.0)
Platelets: 221 10*3/uL (ref 150–400)
RBC: 3.41 MIL/uL — ABNORMAL LOW (ref 4.22–5.81)
RDW: 15.3 % (ref 11.5–15.5)
WBC: 5.8 10*3/uL (ref 4.0–10.5)
nRBC: 0 % (ref 0.0–0.2)

## 2019-12-31 LAB — POCT I-STAT 7, (LYTES, BLD GAS, ICA,H+H)
Acid-Base Excess: 8 mmol/L — ABNORMAL HIGH (ref 0.0–2.0)
Bicarbonate: 32.7 mmol/L — ABNORMAL HIGH (ref 20.0–28.0)
Calcium, Ion: 1.1 mmol/L — ABNORMAL LOW (ref 1.15–1.40)
HCT: 36 % — ABNORMAL LOW (ref 39.0–52.0)
Hemoglobin: 12.2 g/dL — ABNORMAL LOW (ref 13.0–17.0)
O2 Saturation: 100 %
Patient temperature: 36.6
Potassium: 4.5 mmol/L (ref 3.5–5.1)
Sodium: 130 mmol/L — ABNORMAL LOW (ref 135–145)
TCO2: 34 mmol/L — ABNORMAL HIGH (ref 22–32)
pCO2 arterial: 46.2 mmHg (ref 32.0–48.0)
pH, Arterial: 7.456 — ABNORMAL HIGH (ref 7.350–7.450)
pO2, Arterial: 179 mmHg — ABNORMAL HIGH (ref 83.0–108.0)

## 2019-12-31 LAB — COMPREHENSIVE METABOLIC PANEL
ALT: 132 U/L — ABNORMAL HIGH (ref 0–44)
AST: 42 U/L — ABNORMAL HIGH (ref 15–41)
Albumin: 3.1 g/dL — ABNORMAL LOW (ref 3.5–5.0)
Alkaline Phosphatase: 88 U/L (ref 38–126)
Anion gap: 9 (ref 5–15)
BUN: 21 mg/dL (ref 8–23)
CO2: 28 mmol/L (ref 22–32)
Calcium: 8 mg/dL — ABNORMAL LOW (ref 8.9–10.3)
Chloride: 95 mmol/L — ABNORMAL LOW (ref 98–111)
Creatinine, Ser: 2.47 mg/dL — ABNORMAL HIGH (ref 0.61–1.24)
GFR, Estimated: 28 mL/min — ABNORMAL LOW (ref >60)
Glucose, Bld: 220 mg/dL — ABNORMAL HIGH (ref 70–99)
Potassium: 4.1 mmol/L (ref 3.5–5.1)
Sodium: 132 mmol/L — ABNORMAL LOW (ref 135–145)
Total Bilirubin: 1.3 mg/dL — ABNORMAL HIGH (ref 0.3–1.2)
Total Protein: 6.5 g/dL (ref 6.5–8.1)

## 2019-12-31 LAB — BASIC METABOLIC PANEL
Anion gap: 15 (ref 5–15)
Anion gap: 11 (ref 5–15)
BUN: 21 mg/dL (ref 8–23)
BUN: 24 mg/dL — ABNORMAL HIGH (ref 8–23)
CO2: 31 mmol/L (ref 22–32)
CO2: 29 mmol/L (ref 22–32)
Calcium: 8.9 mg/dL (ref 8.9–10.3)
Calcium: 9 mg/dL (ref 8.9–10.3)
Chloride: 86 mmol/L — ABNORMAL LOW (ref 98–111)
Chloride: 90 mmol/L — ABNORMAL LOW (ref 98–111)
Creatinine, Ser: 2.23 mg/dL — ABNORMAL HIGH (ref 0.61–1.24)
Creatinine, Ser: 2.84 mg/dL — ABNORMAL HIGH (ref 0.61–1.24)
GFR, Estimated: 32 mL/min — ABNORMAL LOW (ref >60)
GFR, Estimated: 24 mL/min — ABNORMAL LOW (ref >60)
Glucose, Bld: 238 mg/dL — ABNORMAL HIGH (ref 70–99)
Glucose, Bld: 178 mg/dL — ABNORMAL HIGH (ref 70–99)
Potassium: 3.1 mmol/L — ABNORMAL LOW (ref 3.5–5.1)
Potassium: 4.9 mmol/L (ref 3.5–5.1)
Sodium: 132 mmol/L — ABNORMAL LOW (ref 135–145)
Sodium: 130 mmol/L — ABNORMAL LOW (ref 135–145)

## 2019-12-31 LAB — CBC WITH DIFFERENTIAL/PLATELET
Abs Immature Granulocytes: 0.04 10*3/uL (ref 0.00–0.07)
Abs Immature Granulocytes: 0.04 10*3/uL (ref 0.00–0.07)
Basophils Absolute: 0 10*3/uL (ref 0.0–0.1)
Basophils Absolute: 0 10*3/uL (ref 0.0–0.1)
Basophils Relative: 0 %
Basophils Relative: 0 %
Eosinophils Absolute: 0.3 10*3/uL (ref 0.0–0.5)
Eosinophils Absolute: 0.2 10*3/uL (ref 0.0–0.5)
Eosinophils Relative: 4 %
Eosinophils Relative: 3 %
HCT: 30 % — ABNORMAL LOW (ref 39.0–52.0)
HCT: 32.5 % — ABNORMAL LOW (ref 39.0–52.0)
Hemoglobin: 9.5 g/dL — ABNORMAL LOW (ref 13.0–17.0)
Hemoglobin: 10.2 g/dL — ABNORMAL LOW (ref 13.0–17.0)
Immature Granulocytes: 1 %
Immature Granulocytes: 0 %
Lymphocytes Relative: 15 %
Lymphocytes Relative: 13 %
Lymphs Abs: 1.2 10*3/uL (ref 0.7–4.0)
Lymphs Abs: 1.2 10*3/uL (ref 0.7–4.0)
MCH: 28.4 pg (ref 26.0–34.0)
MCH: 28.6 pg (ref 26.0–34.0)
MCHC: 31.7 g/dL (ref 30.0–36.0)
MCHC: 31.4 g/dL (ref 30.0–36.0)
MCV: 89.8 fL (ref 80.0–100.0)
MCV: 91 fL (ref 80.0–100.0)
Monocytes Absolute: 0.7 10*3/uL (ref 0.1–1.0)
Monocytes Absolute: 1 10*3/uL (ref 0.1–1.0)
Monocytes Relative: 9 %
Monocytes Relative: 10 %
Neutro Abs: 5.3 10*3/uL (ref 1.7–7.7)
Neutro Abs: 6.7 10*3/uL (ref 1.7–7.7)
Neutrophils Relative %: 71 %
Neutrophils Relative %: 74 %
Platelets: 305 10*3/uL (ref 150–400)
Platelets: 305 10*3/uL (ref 150–400)
RBC: 3.34 MIL/uL — ABNORMAL LOW (ref 4.22–5.81)
RBC: 3.57 MIL/uL — ABNORMAL LOW (ref 4.22–5.81)
RDW: 15.6 % — ABNORMAL HIGH (ref 11.5–15.5)
RDW: 15.7 % — ABNORMAL HIGH (ref 11.5–15.5)
WBC: 7.5 10*3/uL (ref 4.0–10.5)
WBC: 9.2 10*3/uL (ref 4.0–10.5)
nRBC: 0.5 % — ABNORMAL HIGH (ref 0.0–0.2)
nRBC: 0.3 % — ABNORMAL HIGH (ref 0.0–0.2)

## 2019-12-31 LAB — MAGNESIUM
Magnesium: 2.1 mg/dL (ref 1.7–2.4)
Magnesium: 1.8 mg/dL (ref 1.7–2.4)
Magnesium: 1.9 mg/dL (ref 1.7–2.4)

## 2019-12-31 LAB — COOXEMETRY PANEL
Carboxyhemoglobin: 2.2 % — ABNORMAL HIGH (ref 0.5–1.5)
Carboxyhemoglobin: 1.9 % — ABNORMAL HIGH (ref 0.5–1.5)
Carboxyhemoglobin: 1.5 % (ref 0.5–1.5)
Methemoglobin: 0.9 % (ref 0.0–1.5)
Methemoglobin: 0.8 % (ref 0.0–1.5)
Methemoglobin: 0.9 % (ref 0.0–1.5)
O2 Saturation: 60.3 %
O2 Saturation: 56.4 %
O2 Saturation: 45.4 %
Total hemoglobin: 9.6 g/dL — ABNORMAL LOW (ref 12.0–16.0)
Total hemoglobin: 10.4 g/dL — ABNORMAL LOW (ref 12.0–16.0)
Total hemoglobin: 9.3 g/dL — ABNORMAL LOW (ref 12.0–16.0)

## 2019-12-31 LAB — HEPARIN LEVEL (UNFRACTIONATED)
Heparin Unfractionated: 1.24 IU/mL — ABNORMAL HIGH (ref 0.30–0.70)
Heparin Unfractionated: 1.18 IU/mL — ABNORMAL HIGH (ref 0.30–0.70)
Heparin Unfractionated: 1.34 IU/mL — ABNORMAL HIGH (ref 0.30–0.70)

## 2019-12-31 LAB — PROTIME-INR
INR: 1.2 (ref 0.8–1.2)
Prothrombin Time: 15.1 seconds (ref 11.4–15.2)

## 2019-12-31 LAB — LACTIC ACID, PLASMA
Lactic Acid, Venous: 0.9 mmol/L (ref 0.5–1.9)
Lactic Acid, Venous: 0.9 mmol/L (ref 0.5–1.9)

## 2019-12-31 LAB — GLUCOSE, CAPILLARY
Glucose-Capillary: 116 mg/dL — ABNORMAL HIGH (ref 70–99)
Glucose-Capillary: 160 mg/dL — ABNORMAL HIGH (ref 70–99)

## 2019-12-31 LAB — FIBRINOGEN: Fibrinogen: 498 mg/dL — ABNORMAL HIGH (ref 210–475)

## 2019-12-31 LAB — APTT
aPTT: 53 seconds — ABNORMAL HIGH (ref 24–36)
aPTT: 90 seconds — ABNORMAL HIGH (ref 24–36)
aPTT: 91 seconds — ABNORMAL HIGH (ref 24–36)
aPTT: 73 seconds — ABNORMAL HIGH (ref 24–36)

## 2019-12-31 SURGERY — CARDIOVERSION
Anesthesia: General

## 2019-12-31 MED ORDER — ENSURE ENLIVE PO LIQD
237.0000 mL | Freq: Two times a day (BID) | ORAL | Status: DC
  Administered 2020-01-01 – 2020-01-05 (×9): 237 mL via ORAL

## 2019-12-31 MED ORDER — PHENYLEPHRINE 40 MCG/ML (10ML) SYRINGE FOR IV PUSH (FOR BLOOD PRESSURE SUPPORT)
PREFILLED_SYRINGE | INTRAVENOUS | Status: AC
  Filled 2019-12-31: qty 10

## 2019-12-31 MED ORDER — NOREPINEPHRINE 4 MG/250ML-% IV SOLN
0.0000 ug/min | INTRAVENOUS | Status: DC
  Filled 2019-12-31: qty 500

## 2019-12-31 MED ORDER — GABAPENTIN 300 MG PO CAPS
300.0000 mg | ORAL_CAPSULE | Freq: Every day | ORAL | Status: DC
  Administered 2020-01-01 – 2020-01-04 (×4): 300 mg via ORAL
  Filled 2019-12-31 (×4): qty 1

## 2019-12-31 MED ORDER — ATORVASTATIN CALCIUM 40 MG PO TABS
40.0000 mg | ORAL_TABLET | Freq: Every evening | ORAL | Status: DC
  Administered 2019-12-31 – 2020-01-17 (×17): 40 mg via ORAL
  Filled 2019-12-31 (×18): qty 1

## 2019-12-31 MED ORDER — FUROSEMIDE 10 MG/ML IJ SOLN
20.0000 mg/h | INTRAVENOUS | Status: DC
  Administered 2019-12-31: 20 mg/h via INTRAVENOUS
  Filled 2019-12-31 (×2): qty 20

## 2019-12-31 MED ORDER — NOREPINEPHRINE 4 MG/250ML-% IV SOLN
INTRAVENOUS | Status: AC
  Administered 2019-12-31: 10 ug/min via INTRAVENOUS
  Filled 2019-12-31: qty 250

## 2019-12-31 MED ORDER — POTASSIUM CHLORIDE CRYS ER 20 MEQ PO TBCR: 60.0000 meq | EXTENDED_RELEASE_TABLET | Freq: Once | ORAL | Status: DC

## 2019-12-31 MED ORDER — NOREPINEPHRINE 16 MG/250ML-% IV SOLN
0.0000 ug/min | INTRAVENOUS | Status: DC
  Administered 2019-12-31: 30 ug/min via INTRAVENOUS
  Administered 2020-01-01: 14 ug/min via INTRAVENOUS
  Administered 2020-01-02: 4 ug/min via INTRAVENOUS
  Filled 2019-12-31 (×2): qty 250

## 2019-12-31 MED ORDER — POTASSIUM CHLORIDE 10 MEQ/50ML IV SOLN
10.0000 meq | INTRAVENOUS | Status: AC
  Administered 2019-12-31 (×4): 10 meq via INTRAVENOUS
  Filled 2019-12-31 (×2): qty 50

## 2019-12-31 NOTE — Progress Notes (Signed)
Pt was transferred to MRI with RN/SWOT. Pt's initial SBP was in low 100s. After 20 minutes in holding area, pt started complaining about some SOB (O2sar stayed in high 90s, no increase in RR), [place] 2L was applied for pt's comfort. Pt's SBP went down to 80s and then 60s. Rapid Response was called, primary RN was notified. Pt stayed alert and oriented without any additional symptoms at that time.

## 2019-12-31 NOTE — Progress Notes (Addendum)
Imaging nurse called regarding the patient not yet in scanner, rhythm remains AFib HR 50's-low100s, though hypotensive and starting levophed. ICU and SWOT RN with patient. Advised I was not participating in clinical care of the patient outside of MRI device programming orders.and to make attending, Dr. Haroldine Laws aware.   Though did not think programming was involved (currently ODO and tachy-therapies off for MRI) I did speak however with AHF team, Dr. Jeffie Pollock was aware of patient's clinical status in MRI and had been called already, I made his aware of ODO/tachy therapies off for MRI and he was OK with this for his scan. (note his base programming pacing was VVI 40)  Tommye Standard, PA-C  MRI was canceled. In communication with industry, device was programmed back to baseline programming. VVI 40 with tachy therapies unchanged and back on. Tommye Standard, PA-C

## 2019-12-31 NOTE — Progress Notes (Signed)
STROKE TEAM PROGRESS NOTE   INTERVAL HISTORY His RN is at the bedside.  I have personally reviewed history of presenting illness, electronic medical records and imaging films in PACS.  Patient developed sudden onset of left-sided weakness, slurred speech and facial weakness which appears to be improving.  CT scan of the head showed no acute abnormality.  MRI scan and MRAs are pending.  He was on IV heparin for elective cardioversion but heparin level was not therapeutic.  Vitals:   12/31/19 0730 12/31/19 0744 12/31/19 0800 12/31/19 0830  BP: 110/83  125/72 (!) 112/96  Pulse: 85  95 80  Resp: 16  16 17   Temp:  98 F (36.7 C)    TempSrc:  Oral    SpO2: 95%  94% 99%  Weight:      Height:       CBC:  Recent Labs  Lab 12/25/19 1757 12/26/19 0428 12/30/19 0433 12/31/19 0334  WBC 5.3   < > 6.0 5.8  NEUTROABS 3.5  --   --   --   HGB 9.2*   < > 9.5* 9.4*  HCT 30.0*   < > 30.1* 29.9*  MCV 92.3   < > 89.1 87.7  PLT 195   < > 215 221   < > = values in this interval not displayed.   Basic Metabolic Panel:  Recent Labs  Lab 12/30/19 0433 12/31/19 0334  NA 132* 132*  K 3.3* 3.1*  CL 92* 86*  CO2 29 31  GLUCOSE 129* 238*  BUN 23 21  CREATININE 2.31* 2.23*  CALCIUM 9.1 8.9  MG 1.7 2.1   Lipid Panel:  Recent Labs  Lab 12/26/19 0438  CHOL 103  TRIG 68  HDL 26*  CHOLHDL 4.0  VLDL 14  LDLCALC 63   HgbA1c:  Recent Labs  Lab 12/26/19 0428  HGBA1C 4.6*    IMAGING past 24 hours CT HEAD CODE STROKE WO CONTRAST  Result Date: 12/30/2019 CLINICAL DATA:  Code stroke.  Neurologic deficit, not specified. EXAM: CT HEAD WITHOUT CONTRAST TECHNIQUE: Contiguous axial images were obtained from the base of the skull through the vertex without intravenous contrast. COMPARISON:  12/18/2018 FINDINGS: Brain: No focal abnormality affects the brainstem or cerebellum. Wallerian degeneration of the cerebral peduncle on the right. Old infarction in the right frontal and insular region progressed  encephalomalacia with adjacent gliosis. Old small vessel infarctions of the left basal ganglia and cerebral hemispheric white matter. No sign of acute infarction, mass lesion, hemorrhage, hydrocephalus or extra-axial collection. Vascular: There is atherosclerotic calcification of the major vessels at the base of the brain. Skull: Negative Sinuses/Orbits: Clear/normal Other: None ASPECTS ([place] Stroke Program Early CT Score) - Ganglionic level infarction (caudate, lentiform nuclei, internal capsule, insula, M1-M3 cortex): 7 - Supraganglionic infarction (M4-M6 cortex): 3 Total score (0-10 with 10 being normal): 10 IMPRESSION: 1. No acute finding by CT. Old right MCA territory infarction. Old small vessel infarctions of the left basal ganglia and cerebral hemispheric white matter. 2. ASPECTS is 10. 3. These results were communicated to Dr. Kenton Kingfisher At 2:31 pmon 11/10/2021by text page via the [facility] messaging system. Electronically Signed   By: Nelson Chimes M.D.   On: 12/30/2019 14:33    PHYSICAL EXAM Frail middle aged african [place] male not in distress. . Afebrile. Head is nontraumatic. Neck is supple without bruit.    Cardiac exam no murmur or gallop. Lungs are clear to auscultation. Distal pulses are well felt. Neurological Exam :  Awake  alert oriented x 3 normal speech and language. Mild left lower face asymmetry. Tongue midline. No drift.Mild left hemiparesis 4/5. Mild diminished fine finger movements on left. Orbits right over left upper extremity. Mild left grip weak.. Normal sensation . Normal coordination.  ASSESSMENT/PLAN Glenn [name] is a 69 y.o. male with history of previous strokes with resultant LLE weakness who developed worsening recurrent L sided deficits which are improving to baseline. Possible infarct.  Stroke:   Possible new R MCA infarct vs recrudescence of symptoms, likely embolic secondary to AF on subtherapeutic IV heparin  Code Stroke CT head No acute abnormality. Old  R MCA, old L basal ganglia and cerebral white matter infarcts. ASPECTS 10.     MRI  Unable to tolerate  Repeat CT head once stable pending   2D Echo EF 20-25%. Severe LVH. LA severely dilated. Pacing wires. No source of embolus seen  LDL 63  HgbA1c 4.6  VTE prophylaxis - IV heparin  Eliquis (apixaban) daily prior to admission but not completely compliant,  on heparin IV past few days and just therapeutic today.   Therapy recommendations:  HH PT  Disposition:  pending   Tremor  New onset LUE tremor - weakness versus encephalopathy  Persistent Atrial Fibrillation w/ RVR  Home anticoagulation:  Eliquis (apixaban) daily but not completely compliant  On IV heparin past few days and just therapeutic today d/t plans for DCCV and TEE - procedures on hold for now   Off amio. Now on norepi and dobutamine  . Continue Eliquis (apixaban) daily at discharge   Hypotension Hx Hypertension  Avoid low BP from stroke standpoint . Ok from stroke to have Permissive hypertension (OK if < 220/120) but gradually normalize in 5-7 days . Long-term BP goal normotensive  Hyperlipidemia  Home meds:  lipitor 20  Now on lipitor 40  LDL 63, goal < 70  Continue statin at discharge  Other Stroke Risk Factors  Advanced Age >/= 65   Former Cigarette smoker  Hx ETOH use, advised to drink no more than 2 drink(s) a day  Hx Substance abuse -  Hx cocaine I the past. Reports 2x week THC. UDS not performed  Hx stroke/TIA  10/2018 -  Lg L MCA infarct w/ hemorrhagic conversion, embolic d/t AF on Eliquis s/p reversal w/ KCentra. ICU on 3%. D/c to CIR. eliquis resumed once hemorrhage resolved. lipitor 20  08/2016 - L MCA s/p IR for L M2 occlusion with taking 3 reperfusion. EF 30 to 35%. Repeat CT no acute abnormality. Did not do MRI due to ICD. LDL 44, A1c 4.7. His Xarelto changed to Eliquis and aspirin.  Family hx stroke (maternal aunt)  Acute on chronic Congestive heart failure d/t  NICM->cardiogenic shock.  Non-ischemic cardiomyopathy w/ EF 15-20% s/p ICD  Nov 2015   Other Active Problems  Acute on CKD IIIb  Elevated troponin   Chronic cholecystitis  Fe def anemia  Depression  Hospital day # 5  He developed  Slurred speech and left sided weakness while Eliquis was held for elective cardioversion and he was on IV heparin due to subtherapeutic level.  Likely has right brain subcortical cardioembolic stroke.  Recommend continue IV heparin till he can be switched over to Eliquis after cardioversion.  Check MRI scan and MRA only if patient is hemodynamically stable enough to undergo the procedure otherwise just repeat CT scan in 24 hours.  Long discussion with patient, nurse and Dr. Haroldine Laws and answered questions.  Greater than 50%  time during this 35-minute visit was spent on counseling and coordination of care about his cardioembolic stroke and left hemiparesis and answering questions. Antony Contras, MD  To contact Stroke Continuity provider, please refer to [email or web address] After hours, contact General Neurology

## 2019-12-31 NOTE — Progress Notes (Signed)
SLP Cancellation Note  Patient Details Name: Glenn [name] MRN: [id number] DOB: March 14, 1950   Cancelled treatment:       Reason Eval/Treat Not Completed: Patient at procedure or test/unavailable (Pt is currently at MRI. SLP will follow up. )  Larsen Dungan I. Hardin Negus, [place], [place] Office number [phone] Pager [email or web address] 12/31/2019, 11:48 AM

## 2019-12-31 NOTE — Progress Notes (Addendum)
Was notified that patient was at MRI not able to get imaging due to hypotension and in afib w/ rates going from 50-100s.    RN stopped amiodarone gtt; started norepinephrine 10 and continued dobutamine 3.5.  He was placed on 10L of oxygen via n/c due to hypoxia.  He remained alert and oriented, but cool and clammy.    His only complaint was pain to LUE and RLE.  Denies dizziness, headaches or visual changes.    On examination, initially was warm but became cool and clammy; following commands, alert and oriented x 3, PERRLA, LUE 4/5 w/ action tremor; diminished to upper lobes w/ fine rales to RLL. No swelling, warmth or asymmetry compared between to lower extremities.    Spoke w/ Neurology APP due to concern of neuro symptoms (new tremor) and unable to obtain MRI/MRA.  Will obtain CT brain w/o CTX once stable.  Continue neuro checks.  Will obtain co-ox, ABG, & CXR.  CVP now 20 (up from 5-6 this morning).    Update addendum:  Reduced gabapentin dose in setting of AKI and new onset tremors.  Calcium level 8.9, BUN 21. Stat CBC, BMP, lactic acid and blood cultures.

## 2019-12-31 NOTE — Progress Notes (Signed)
SLP Cancellation Note  Patient Details Name: Glenn [name] MRN: [id number] DOB: 06-30-1950   Cancelled treatment:       Reason Eval/Treat Not Completed: Patient not medically ready (Case discussed with RN and she requested that SLP follow up on subsequent date due to pt's increasing medical challenges today.)  [name] I. Hardin Negus, [place], [place] Office number [phone] Pager [place] 12/31/2019, 3:21 PM

## 2019-12-31 NOTE — Progress Notes (Signed)
Per order, changed device settings for MRI to  ODO, Tachy-therapies to off  Will program device back to pre-MRI settings after completion of exam and send transmission.

## 2019-12-31 NOTE — Chronic Care Management (AMB) (Signed)
   RN Case Manager Care Management Admission Note 12/31/2019   RNCM notified that Glenn [name]  MRN: [id number] DOB: 26-May-1950 was admitted to the hospital on 12/25/19 for Atrial Fibrillation with RVR.  Plan:  RNCM will monitor the patients progress for follow up.  [name] RN, BSN, [facility] Care Management Coordinator [place] Phone: [phone] Fax: [phone]

## 2019-12-31 NOTE — Progress Notes (Signed)
[place] for heparin Indication: atrial fibrillation  Allergies  Allergen Reactions  . Benadryl [Diphenhydramine] Palpitations    Patient Measurements: Height: 5\' 7"  (170.2 cm) Weight: 77.6 kg (171 lb 1.2 oz) IBW/kg (Calculated) : 66.1 Heparin Dosing Weight: 78 kg  Vital Signs: Temp: 99.5 F (37.5 C) (11/11 1900) Temp Source: Axillary (11/11 1541) BP: 139/102 (11/11 1855) Pulse Rate: 129 (11/11 1900)  Labs: Recent Labs    12/30/19 0433 12/30/19 0433 12/30/19 0813 12/30/19 0857 12/30/19 1743 12/31/19 0334 12/31/19 0334 12/31/19 0817 12/31/19 1505 12/31/19 1505 12/31/19 1540 12/31/19 1837  HGB 9.5*   < >  --   --   --  9.4*   < >  --  12.2*   < > 9.5* 10.2*  HCT 30.1*   < >  --   --   --  29.9*   < >  --  36.0*  --  30.0* 32.5*  PLT 215   < >  --   --   --  221  --   --   --   --  305 305  APTT  --    < >   < >  --  49* 53*   < > 90*  --   --  91* 73*  LABPROT  --   --   --  17.4* 16.5*  --   --   --   --   --   --  15.1  INR  --   --   --  1.5* 1.4*  --   --   --   --   --   --  1.2  HEPARINUNFRC  --   --   --   --  1.60* 1.24*  --   --   --   --  1.18*  --   CREATININE 2.31*  --   --   --   --  2.23*  --   --   --   --  2.47*  --    < > = values in this interval not displayed.    Estimated Creatinine Clearance: 27.1 mL/min (A) (by C-G formula based on SCr of 2.47 mg/dL (H)).   Assessment: 40 yoM admitted with ADHF and Afib now with worsening cardiogenic shock. Pt on apixaban for AFib (last dose 0900 11/9), to transition to IV heparin in anticipation of invasive procedures.  Code stroke called 11/10, CT head negative for bleed. Heparin resumed per Neuro (adjusted goals, no further boluses).   APTT therapeutic this evening at 73 sec (aPTT of 91 sec was drawn early and therefore inaccurate).  No issue with heparin infusion nor bleeding per RN.  Goal of Therapy:  Heparin level 0.3-0.5 units/ml aPTT 66-85 seconds Monitor  platelets by anticoagulation protocol: Yes   Plan: Continue heparin gtt at 1550 units/hr F/U AM labs  Ennifer Harston D. Mina Marble, PharmD, BCPS, [place] 12/31/2019, 7:20 PM

## 2019-12-31 NOTE — Progress Notes (Signed)
ANTICOAGULATION CONSULT NOTE  Pharmacy Consult for heparin Indication: atrial fibrillation  Allergies  Allergen Reactions   Benadryl [Diphenhydramine] Palpitations    Patient Measurements: Height: 5\' 7"  (170.2 cm) Weight: 77.6 kg (171 lb 1.2 oz) IBW/kg (Calculated) : 66.1 Heparin Dosing Weight: 78 kg  Vital Signs: Temp: 98 F (36.7 C) (11/11 0744) Temp Source: Oral (11/11 0744) BP: 112/96 (11/11 0830) Pulse Rate: 80 (11/11 0830)  Labs: Recent Labs    12/29/19 0535 12/29/19 0535 12/30/19 0433 12/30/19 0813 12/30/19 0857 12/30/19 1743 12/31/19 0334 12/31/19 0817  HGB 8.8*   < > 9.5*  --   --   --  9.4*  --   HCT 28.5*  --  30.1*  --   --   --  29.9*  --   PLT 209  --  215  --   --   --  221  --   APTT  --   --   --    < >  --  49* 53* 90*  LABPROT  --   --   --   --  17.4* 16.5*  --   --   INR  --   --   --   --  1.5* 1.4*  --   --   HEPARINUNFRC  --   --   --   --   --  1.60* 1.24*  --   CREATININE 2.51*  --  2.31*  --   --   --  2.23*  --    < > = values in this interval not displayed.    Estimated Creatinine Clearance: 30.1 mL/min (A) (by C-G formula based on SCr of 2.23 mg/dL (H)).   Medical History: Past Medical History:  Diagnosis Date   Acute blood loss anemia    Acute CVA (cerebrovascular accident) ([place])    Acute deep vein thrombosis (DVT) of both lower extremities ([name])    Acute kidney injury ([place])    Acute on chronic combined systolic and diastolic CHF (congestive heart failure) ([name])    Acute renal failure superimposed on stage 3a chronic kidney disease ([name])    Acute respiratory failure with hypoxia ([name])    Atrial fibrillation ([name])    Carpal tunnel syndrome of right wrist 02/28/2018   Cerebral edema ([place]) 11/13/2018   Cerebral infarction ([name])    CHF (congestive heart failure) ([place])    Cholecystitis 02/04/2019   Chronic right hip pain    DCM (dilated cardiomyopathy) ([name])    Dysphagia, post-stroke    Elevated troponin     Entrapment of right ulnar nerve 02/28/2018   Headache due to intracranial disease 11/14/2018   Hepatitis C    History of hemorrhagic stroke with residual hemiparesis ([place]) 02/04/2019   HTN (hypertension) 08/14/2016   Hyperlipidemia LDL goal <70 11/13/2018   Hypertension    ICD (implantable cardioverter-defibrillator) in place 09/13/2016   Impotence due to erectile dysfunction 09/30/2017   Ischemic cardiomyopathy    Labile blood glucose    Left leg DVT ([place]) 02/04/2019   Marijuana user 11/13/2018   Paroxysmal atrial fibrillation ([place])    Right middle cerebral artery stroke ([place]) 11/13/2018   Solitary pulmonary nodule 06/10/2017   5 mm RUL nodule noted incidentally as part of CVA workup 08/2016. With smoking history would obtain low-dose CT scan 08/2017.    Stroke (cerebrum) ([name]) Lg L MCA infarct w/ hemorrhagic conversion, embolic d/t AF 2/99/3716   Stroke ([place])    Trochanteric bursitis, right hip 11/14/2018  Visit for monitoring Tikosyn therapy 03/26/2017     Assessment: 89 yoM admitted with ADHF and AFib now with worsening cardiogenic shock. Pt on apixaban for AFib (last dose 0900 11/9), now to transition to IV heparin in anticipation of invasive procedures.  Code stroke called 11/10, CT head negative for bleed. Heparin resumed per neuro (adjusted goals, no further boluses). APTT this morning 90 seconds and above stroke goal. Will reduce heparin infusion slightly.  Goal of Therapy:  Heparin level 0.3-0.5 units/ml aPTT 66-85 seconds Monitor platelets by anticoagulation protocol: Yes   Plan: Reduce heparin to 1550 units/h Recheck aPTT tonight Daily aPTT and heparin level Follow neuro status and CBC   Arrie Senate, PharmD, BCPS Clinical Pharmacist [phone] Please check AMION for all [place] numbers 12/31/2019

## 2019-12-31 NOTE — Progress Notes (Deleted)
[place] for heparin Indication: atrial fibrillation  Allergies  Allergen Reactions  . Benadryl [Diphenhydramine] Palpitations    Patient Measurements: Height: 5\' 7"  (170.2 cm) Weight: 77.6 kg (171 lb 1.2 oz) IBW/kg (Calculated) : 66.1 Heparin Dosing Weight: 78 kg  Vital Signs: Temp: 97.9 F (36.6 C) (11/11 0430) Temp Source: Oral (11/11 0430) BP: 108/61 (11/11 0600) Pulse Rate: 97 (11/11 0600)  Labs: Recent Labs    12/29/19 0535 12/29/19 0535 12/30/19 0433 12/30/19 0813 12/30/19 0857 12/30/19 1743 12/31/19 0334  HGB 8.8*   < > 9.5*  --   --   --  9.4*  HCT 28.5*  --  30.1*  --   --   --  29.9*  PLT 209  --  215  --   --   --  221  APTT  --   --   --  46*  --  49* 53*  LABPROT  --   --   --   --  17.4* 16.5*  --   INR  --   --   --   --  1.5* 1.4*  --   HEPARINUNFRC  --   --   --   --   --  1.60* 1.24*  CREATININE 2.51*  --  2.31*  --   --   --  2.23*   < > = values in this interval not displayed.    Estimated Creatinine Clearance: 30.1 mL/min (A) (by C-G formula based on SCr of 2.23 mg/dL (H)).   Medical History: Past Medical History:  Diagnosis Date  . Acute blood loss anemia   . Acute CVA (cerebrovascular accident) ([place])   . Acute deep vein thrombosis (DVT) of both lower extremities ([name])   . Acute kidney injury ([place])   . Acute on chronic combined systolic and diastolic CHF (congestive heart failure) ([place])   . Acute renal failure superimposed on stage 3a chronic kidney disease ([place])   . Acute respiratory failure with hypoxia ([place])   . Atrial fibrillation ([place])   . Carpal tunnel syndrome of right wrist 02/28/2018  . Cerebral edema ([place]) 11/13/2018  . Cerebral infarction ([place])   . CHF (congestive heart failure) ([place])   . Cholecystitis 02/04/2019  . Chronic right hip pain   . DCM (dilated cardiomyopathy) ([place])   . Dysphagia, post-stroke   . Elevated troponin   . Entrapment of right ulnar nerve 02/28/2018  .  Headache due to intracranial disease 11/14/2018  . Hepatitis C   . History of hemorrhagic stroke with residual hemiparesis ([place]) 02/04/2019  . HTN (hypertension) 08/14/2016  . Hyperlipidemia LDL goal <70 11/13/2018  . Hypertension   . ICD (implantable cardioverter-defibrillator) in place 09/13/2016  . Impotence due to erectile dysfunction 09/30/2017  . Ischemic cardiomyopathy   . Labile blood glucose   . Left leg DVT ([place]) 02/04/2019  . Marijuana user 11/13/2018  . Paroxysmal atrial fibrillation ([name])   . Right middle cerebral artery stroke ([place]) 11/13/2018  . Solitary pulmonary nodule 06/10/2017   5 mm RUL nodule noted incidentally as part of CVA workup 08/2016. With smoking history would obtain low-dose CT scan 08/2017.   . Stroke (cerebrum) ([name]) Lg L MCA infarct w/ hemorrhagic conversion, embolic d/t AF 4/40/3474  . Stroke ([place])   . Trochanteric bursitis, right hip 11/14/2018  . Visit for monitoring Tikosyn therapy 03/26/2017     Assessment: 53 yoM admitted with ADHF and AFib now with worsening cardiogenic shock. Pt on  apixaban for AFib (last dose 0900 11/9), now to transition to IV heparin in anticipation of invasive procedures.  Code stroke called 11/10, CT head negative for bleed. Heparin resumed per neuro (adjusted goals, no further boluses). APTT drawn early this morning but subtherapeutic at 53 seconds, heparin level remains altered by DOAC use. CBC stable.  Goal of Therapy:  Heparin level 0.3-0.5 units/ml aPTT 66-85 seconds Monitor platelets by anticoagulation protocol: Yes   Plan: Increase heparin to 1750 units/hr Recheck aptt/heparin level in am  Arrie Senate, PharmD, BCPS Clinical Pharmacist [phone] Please check AMION for all [place] numbers 12/31/2019

## 2019-12-31 NOTE — Progress Notes (Addendum)
Advanced Heart Failure Rounding Note  PCP-Cardiologist: Virl Axe, MD   Subjective:    PICC placed 11/6 with initial CO-OX 31% and started Milrinone 0.25. On 11/8 txf ICU due to Co-ox remaining 30-40% on milrinone & started NE.    Yesterday:Remained on dobutamine 3 mcg for co-ox 36% with amio 60/heparin gtt for AF.  Around 14:00 new onset L facial droop w/ slurred speech.  Code stroke called.  TEE DC-CV canceled.   Today: CVP 5, co-ox 60% which is up from 36%, will repeat.  Denies chest pain, shortness of breath, weakness, numbness/tingling or headaches.  Remains weak to LUE with L facial droop and dysmetria on the L side.     Objective:   Weight Range: 77.6 kg Body mass index is 26.79 kg/m.   Vital Signs:   Temp:  [97.7 F (36.5 C)-98.3 F (36.8 C)] 97.9 F (36.6 C) (11/11 0430) Pulse Rate:  [48-115] 97 (11/11 0600) Resp:  [12-24] 12 (11/11 0600) BP: (89-139)/(55-118) 108/61 (11/11 0600) SpO2:  [94 %-100 %] 95 % (11/11 0600) Last BM Date: 12/27/19  Weight change: Filed Weights   12/28/19 0420 12/29/19 0544 12/30/19 0444  Weight: 77.1 kg 77.9 kg 77.6 kg    Intake/Output:   Intake/Output Summary (Last 24 hours) at 12/31/2019 0631 Last data filed at 12/31/2019 0400 Gross per 24 hour  Intake 1426.97 ml  Output 4815 ml  Net -3388.03 ml      Physical Exam   CVP 5 General:  Well appearing. No resp difficulty.  L facial droop.  HEENT: PERRLA, EOMI Neck: supple. no JVD. Carotids 2+ bilat; no bruits. No lymphadenopathy or thryomegaly appreciated. Cor: PMI nondisplaced. Irregular rate & rhythm. No rubs, or gallops. + murmurs. Lungs: clear bilaterally.   Abdomen: soft, nontender, nondistended. No hepatosplenomegaly. No bruits or masses. Bowel sounds active in all 4 quadrants.  Extremities: no cyanosis, clubbing, rash, edema Neuro: alert & oriented x 3, L facial droop, L dysmetria, LUE weakness 4/5 strength (all other extremities 5/5). L shoulder shrug weaker  than R.  Affect pleasant    Telemetry   Afib with occasional PVC rates 80-110s.  Personally reviewed.    Labs    CBC Recent Labs    12/30/19 0433 12/31/19 0334  WBC 6.0 5.8  HGB 9.5* 9.4*  HCT 30.1* 29.9*  MCV 89.1 87.7  PLT 215 510   Basic Metabolic Panel Recent Labs    12/30/19 0433 12/31/19 0334  NA 132* 132*  K 3.3* 3.1*  CL 92* 86*  CO2 29 31  GLUCOSE 129* 238*  BUN 23 21  CREATININE 2.31* 2.23*  CALCIUM 9.1 8.9  MG 1.7 2.1   Liver Function Tests No results for input(s): AST, ALT, ALKPHOS, BILITOT, PROT, ALBUMIN in the last 72 hours. No results for input(s): LIPASE, AMYLASE in the last 72 hours. Cardiac Enzymes No results for input(s): CKTOTAL, CKMB, CKMBINDEX, TROPONINI in the last 72 hours.  BNP: BNP (last 3 results) Recent Labs    06/01/19 1413 06/23/19 1527 12/25/19 1757  BNP 531.4* 613.2* 776.7*    ProBNP (last 3 results) No results for input(s): PROBNP in the last 8760 hours.   D-Dimer No results for input(s): DDIMER in the last 72 hours. Hemoglobin A1C No results for input(s): HGBA1C in the last 72 hours. Fasting Lipid Panel No results for input(s): CHOL, HDL, LDLCALC, TRIG, CHOLHDL, LDLDIRECT in the last 72 hours. Thyroid Function Tests No results for input(s): TSH, T4TOTAL, T3FREE, THYROIDAB in the last  72 hours.  Invalid input(s): FREET3  Other results:   Imaging    CT HEAD CODE STROKE WO CONTRAST  Result Date: 12/30/2019 CLINICAL DATA:  Code stroke.  Neurologic deficit, not specified. EXAM: CT HEAD WITHOUT CONTRAST TECHNIQUE: Contiguous axial images were obtained from the base of the skull through the vertex without intravenous contrast. COMPARISON:  12/18/2018 FINDINGS: Brain: No focal abnormality affects the brainstem or cerebellum. Wallerian degeneration of the cerebral peduncle on the right. Old infarction in the right frontal and insular region progressed encephalomalacia with adjacent gliosis. Old small vessel  infarctions of the left basal ganglia and cerebral hemispheric white matter. No sign of acute infarction, mass lesion, hemorrhage, hydrocephalus or extra-axial collection. Vascular: There is atherosclerotic calcification of the major vessels at the base of the brain. Skull: Negative Sinuses/Orbits: Clear/normal Other: None ASPECTS ([place] Stroke Program Early CT Score) - Ganglionic level infarction (caudate, lentiform nuclei, internal capsule, insula, M1-M3 cortex): 7 - Supraganglionic infarction (M4-M6 cortex): 3 Total score (0-10 with 10 being normal): 10 IMPRESSION: 1. No acute finding by CT. Old right MCA territory infarction. Old small vessel infarctions of the left basal ganglia and cerebral hemispheric white matter. 2. ASPECTS is 10. 3. These results were communicated to Dr. Kenton Kingfisher At 2:31 pmon 11/10/2021by text page via the [facility] messaging system. Electronically Signed   By: Nelson Chimes M.D.   On: 12/30/2019 14:33     Medications:     Scheduled Medications: . atorvastatin  20 mg Oral QPM  . Chlorhexidine Gluconate Cloth  6 each Topical Daily  . gabapentin  300 mg Oral BID  . hydrOXYzine  25 mg Oral QHS  . pantoprazole  40 mg Oral Daily  . potassium chloride  40 mEq Oral Daily  . potassium chloride  40 mEq Oral Once  . sodium chloride flush  10-40 mL Intracatheter Q12H    Infusions: . sodium chloride 5 mL/hr at 12/30/19 1700  . amiodarone 60 mg/hr (12/31/19 0550)  . DOBUTamine 3 mcg/kg/min (12/30/19 1900)  . furosemide (LASIX) 200 mg in dextrose 5% 100 mL (2mg /mL) infusion 20 mg/hr (12/30/19 1900)  . heparin 1,600 Units/hr (12/30/19 1942)  . norepinephrine (LEVOPHED) Adult infusion Stopped (12/29/19 1021)    PRN Medications: acetaminophen, calcium carbonate, hydrOXYzine, ondansetron (ZOFRAN) IV, sodium chloride flush    Patient Profile  69 y.o.malewith a hx of chronic systolic HF 2/2 NICM s/pBSciICD, persistent atrial fibrillation, h/o stroke (Lg L MCA infarct w/  hemorrhagic conversionin 2018), HTN, HCV, tobacco abuse (smokes ~1/4 ppd),  and chronic cholecystitiswith achronic perc drain.   Admitted with ADHF. Started on milrinone.    Assessment/Plan   1. Acute on chronic systolic HF due to NICM -> cardiogenic shock - suspect AF cardiomyopathy - ICD placed Nov 2015 - [facility] 10/2018 showed normal cors, elevated filling pressures and low CI at 1.9 - TTE 11/2018 LVEF 20%, mild RV dysfunction - TEE 5/21 EF 20-25% with moderate to severe RV dysfunction.  - Admit 06/2019 with severe HF, required IABP and milrinone. - Admitted 12/25/2019 with ADHF. Milrinone 0.25 mcg started 11/7 due to low output HF.  - Echo 12/28/19 EF 20-25% RV mildly decreased  - Moved to ICU on 11/8 due to persistently low co-ox on milrinone. - 12/29/19: Continued w/ persistently low co-ox despite milrinone 0.5 and NE which was stopped d/t elevated MAPs. Gave metolazone 2/2 low UOP. Transitioned to dobutamine gtt starting at 2.5 mcg then increased to 3 mcg.  - Suspect RV function worse than it looks  on echo.  - CVP 5, +4 lbs and -7.4L since admission. Co-ox 60% this AM, will repeat since previous reading was 36%.  - Continue with lasix gtt at 20 and dobutamine at 3, will likely stop dobutamine after repeat co-ox.   - Replete potassium and magnesium.  - No beta blockers due to low output HF - No dig or ACE/ARB/ARNI with worsening renal function.  - Options limited - poor LVAD candidate with noncompliance and renal failure. He did not make one visit to HF or EP clinic after dramatic hospitalization for AF and cardiogenic shock in 5/21  2. Concerning for acute ischemic R MCA -11/10 around 14:00 developed new L facial droop, LUE weakness and slurred speech - Code Stroke called. -hx of large R MCA with hemorrhagic conversion in Sept 2020 & L MCA s/p thrombectomy M2 occlusion in 2018.  - A1c 4.6%, LDL 63, HDL 26, Cholesterol 103, Triglycerides 68 -Neuro recs appreciated: MRI Brain w/o CTX  & MRA head and neck w/o CTX 2/2 AKI (ICD placed 2015, had previous MRI in 2020). May continue hep gtt and allow for permissive HTN x 24h.  - Swallow screen, PT/OT, neuro orders placed - Atrovastatin dose increased, will await to initiated ASA after MRI/MRA results  3.  Persistent Afib with RVR - remains in AF with rates 100-115.  - In 5/21 had refractory AF requiring multiple DC-CVs, high-dose amio and ranexa. He reported intermittent compliance with amio and Eliquis. Out of eliquis prior to admission.  - Stopped ranexa on 11/9. - Transitioned from eliquis to heparin gtt due to possible need for procedures on 11/9 and planned for TEE DC-CV was scheduled 11/10, however canceled due to new neuro symptoms.   4. Acute on CKD IIIb - Baseline Cr looks to be around 1.6 to 1.7 - Admission Cr 1.69-->2.23 (improving) - Peak Cr 2.51 - Likely ATN/cardiorenal - Continue to monitor BMET    5. Elevated troponin - 10/2018 cath clean coronaries - elevated trops due to afib with RVR, CHF  6. Chronic cholecystitis with perc drain -  Per primary team. Perc drain now fell out -  Will follow up outpatient   7. Iron deficiency anemia - Iron sats low.  - Has received feraheme  8. Depression - Watch QT closely on amio.  - Stopped zoloft 11/10 d/t prolong QT.   - Ranexa stopped 11/9.    Advanced Heart Failure Team Pager [phone] (M-F; [place])  Please contact [name] for night-coverage after hours (4p -7a ) and weekends on [email or web address]  Carlene Coria NP 6:31 AM  Length of Stay: 5   Agree with above.  Had acute CVA yesterday while on heparin. Symptoms now resolving. Remains on dobutamine 3 and lasix gtt at 20. On amio and IV heparin for AF. Co-ox now up to 60% and CVP down to 6   General:  Lying flat in bed No resp difficulty HEENT: normal mild left facial droop  Neck: supple. no JVD. Carotids 2+ bilat; no bruits. No lymphadenopathy or thryomegaly appreciated. Cor: PMI  nondisplaced. Irregular rate & rhythm. No rubs, gallops or murmurs. Lungs: clear Abdomen: soft, nontender, nondistended. No hepatosplenomegaly. No bruits or masses. Good bowel sounds. Extremities: no cyanosis, clubbing, rash, trace edema Neuro: alert & orientedx3, cranial nerves grossly intact. moves all 4 extremities w/o difficulty. Affect pleasant  CVA symptoms resolving. Co-ox and CVP much improved (I am uncertain how it got so much better overnight). Will stop lasix. Continue dobutamine. Repeat co-ox.  Will need brain MRI/MRA today.   Despite CVA still need to get back in NSR at some point. Will recosndier high-risk TEE and DC-CV next week.   Mobilize as tolerated.  CRITICAL CARE Performed by: Glori Bickers  Total critical care time: 35 minutes  Critical care time was exclusive of separately billable procedures and treating other patients.  Critical care was necessary to treat or prevent imminent or life-threatening deterioration.  Critical care was time spent personally by me (independent of midlevel providers or residents) on the following activities: development of treatment plan with patient and/or surrogate as well as nursing, discussions with consultants, evaluation of patient's response to treatment, examination of patient, obtaining history from patient or surrogate, ordering and performing treatments and interventions, ordering and review of laboratory studies, ordering and review of radiographic studies, pulse oximetry and re-evaluation of patient's condition.  Glori Bickers, MD  8:03 AM

## 2020-01-01 ENCOUNTER — Inpatient Hospital Stay (HOSPITAL_COMMUNITY): Payer: Medicare HMO

## 2020-01-01 DIAGNOSIS — I4819 Other persistent atrial fibrillation: Secondary | ICD-10-CM | POA: Diagnosis not present

## 2020-01-01 DIAGNOSIS — I639 Cerebral infarction, unspecified: Secondary | ICD-10-CM | POA: Diagnosis not present

## 2020-01-01 LAB — BASIC METABOLIC PANEL
Anion gap: 14 (ref 5–15)
BUN: 24 mg/dL — ABNORMAL HIGH (ref 8–23)
CO2: 29 mmol/L (ref 22–32)
Calcium: 9 mg/dL (ref 8.9–10.3)
Chloride: 89 mmol/L — ABNORMAL LOW (ref 98–111)
Creatinine, Ser: 2.79 mg/dL — ABNORMAL HIGH (ref 0.61–1.24)
GFR, Estimated: 24 mL/min — ABNORMAL LOW (ref >60)
Glucose, Bld: 186 mg/dL — ABNORMAL HIGH (ref 70–99)
Potassium: 3.8 mmol/L (ref 3.5–5.1)
Sodium: 132 mmol/L — ABNORMAL LOW (ref 135–145)

## 2020-01-01 LAB — APTT: aPTT: 71 seconds — ABNORMAL HIGH (ref 24–36)

## 2020-01-01 LAB — GLUCOSE, CAPILLARY
Glucose-Capillary: 155 mg/dL — ABNORMAL HIGH (ref 70–99)
Glucose-Capillary: 107 mg/dL — ABNORMAL HIGH (ref 70–99)
Glucose-Capillary: 129 mg/dL — ABNORMAL HIGH (ref 70–99)

## 2020-01-01 LAB — COOXEMETRY PANEL
Carboxyhemoglobin: 1.8 % — ABNORMAL HIGH (ref 0.5–1.5)
Methemoglobin: 1 % (ref 0.0–1.5)
O2 Saturation: 55.6 %
Total hemoglobin: 10 g/dL — ABNORMAL LOW (ref 12.0–16.0)

## 2020-01-01 LAB — HEPARIN LEVEL (UNFRACTIONATED): Heparin Unfractionated: 0.9 IU/mL — ABNORMAL HIGH (ref 0.30–0.70)

## 2020-01-01 LAB — CBC
HCT: 31.1 % — ABNORMAL LOW (ref 39.0–52.0)
Hemoglobin: 9.9 g/dL — ABNORMAL LOW (ref 13.0–17.0)
MCH: 28.4 pg (ref 26.0–34.0)
MCHC: 31.8 g/dL (ref 30.0–36.0)
MCV: 89.1 fL (ref 80.0–100.0)
Platelets: 273 10*3/uL (ref 150–400)
RBC: 3.49 MIL/uL — ABNORMAL LOW (ref 4.22–5.81)
RDW: 16.1 % — ABNORMAL HIGH (ref 11.5–15.5)
WBC: 9.1 10*3/uL (ref 4.0–10.5)
nRBC: 0 % (ref 0.0–0.2)

## 2020-01-01 MED ORDER — BOOST / RESOURCE BREEZE PO LIQD CUSTOM
1.0000 | Freq: Three times a day (TID) | ORAL | Status: DC
  Administered 2020-01-02 – 2020-01-05 (×10): 1 via ORAL

## 2020-01-01 MED ORDER — INSULIN ASPART 100 UNIT/ML ~~LOC~~ SOLN
0.0000 [IU] | Freq: Three times a day (TID) | SUBCUTANEOUS | Status: DC
  Administered 2020-01-01: 2 [IU] via SUBCUTANEOUS
  Administered 2020-01-02 – 2020-01-04 (×4): 1 [IU] via SUBCUTANEOUS
  Administered 2020-01-07: 2 [IU] via SUBCUTANEOUS
  Administered 2020-01-07 – 2020-01-09 (×2): 1 [IU] via SUBCUTANEOUS
  Administered 2020-01-09: 2 [IU] via SUBCUTANEOUS
  Administered 2020-01-10: 1 [IU] via SUBCUTANEOUS
  Administered 2020-01-12 – 2020-01-14 (×3): 2 [IU] via SUBCUTANEOUS
  Administered 2020-01-15: 1 [IU] via SUBCUTANEOUS

## 2020-01-01 MED ORDER — TORSEMIDE 20 MG PO TABS
60.0000 mg | ORAL_TABLET | Freq: Two times a day (BID) | ORAL | Status: DC
  Administered 2020-01-01 (×2): 60 mg via ORAL
  Filled 2020-01-01 (×2): qty 3

## 2020-01-01 NOTE — Progress Notes (Signed)
STROKE TEAM PROGRESS NOTE   INTERVAL HISTORY Reported tremor now resolved. Otherwise neuro stable, sitting in chair, alert, eating.   Vitals:   01/01/20 0930 01/01/20 1000 01/01/20 1030 01/01/20 1122  BP:  125/69    Pulse: (!) 111 (!) 123    Resp: (!) 22 16 (!) 22   Temp: 97.9 F (36.6 C) 97.9 F (36.6 C) 97.7 F (36.5 C) 98.2 F (36.8 C)  TempSrc:    Oral  SpO2: 96% 95%    Weight:      Height:       CBC:  Recent Labs  Lab 12/31/19 1540 12/31/19 1540 12/31/19 1837 01/01/20 0352  WBC 7.5   < > 9.2 9.1  NEUTROABS 5.3  --  6.7  --   HGB 9.5*   < > 10.2* 9.9*  HCT 30.0*   < > 32.5* 31.1*  MCV 89.8   < > 91.0 89.1  PLT 305   < > 305 273   < > = values in this interval not displayed.   Basic Metabolic Panel:  Recent Labs  Lab 12/31/19 1540 12/31/19 1540 12/31/19 1837 01/01/20 0352  NA 132*   < > 130* 132*  K 4.1   < > 4.9 3.8  CL 95*   < > 90* 89*  CO2 28   < > 29 29  GLUCOSE 220*   < > 178* 186*  BUN 21   < > 24* 24*  CREATININE 2.47*   < > 2.84* 2.79*  CALCIUM 8.0*   < > 9.0 9.0  MG 1.8  --  1.9  --    < > = values in this interval not displayed.   Lipid Panel:  Recent Labs  Lab 12/26/19 0438  CHOL 103  TRIG 68  HDL 26*  CHOLHDL 4.0  VLDL 14  LDLCALC 63   HgbA1c:  Recent Labs  Lab 12/26/19 0428  HGBA1C 4.6*    IMAGING past 24 hours CT HEAD WO CONTRAST  Result Date: 01/01/2020 CLINICAL DATA:  Stroke follow-up EXAM: CT HEAD WITHOUT CONTRAST TECHNIQUE: Contiguous axial images were obtained from the base of the skull through the vertex without intravenous contrast. COMPARISON:  Two days ago FINDINGS: Brain: Large remote right MCA territory infarct affecting the anterior division and the right ACA territory. No visible acute infarct. No hemorrhage, hydrocephalus, or collection. Brain atrophy. Vascular: Atheromatous calcification. Skull: Normal. Negative for fracture or focal lesion. Sinuses/Orbits: Negative IMPRESSION: 1. No acute or interval finding.  2. Remote infarct at the right MCA anterior division and right ACA territory. Electronically Signed   By: Glenn [name] M.D.   On: 01/01/2020 06:52   DG CHEST PORT 1 VIEW  Result Date: 12/31/2019 CLINICAL DATA:  69 year old who had a recent stroke. While in MRI earlier today, the patient sustained an episode of hypoxia, hypotension and bradycardia. Indwelling pacing defibrillator. EXAM: PORTABLE CHEST 1 VIEW COMPARISON:  12/27/2019 and earlier, including CT chest 12/11/2018. FINDINGS: Cardiac silhouette markedly enlarged, unchanged. LEFT subclavian pacing defibrillator unchanged and appears intact. RIGHT arm PICC tip projects over the mid SVC, unchanged. Since the examination 4 days ago, development of pulmonary venous hypertension and minimal interstitial pulmonary edema. No confluent or ground-glass airspace consolidation. Possible small RIGHT pleural effusion. IMPRESSION: Stable marked cardiomegaly. Developing pulmonary venous hypertension and minimal interstitial pulmonary edema since the examination 4 days ago, indicating CHF and/or fluid overload. Electronically Signed   By: Evangeline Dakin M.D.   On: 12/31/2019 18:49  PHYSICAL EXAM:  Frail middle aged african [place] male not in distress. . Afebrile. Head is nontraumatic. Neck is supple without bruit.    Cardiac exam no murmur or gallop. Lungs are clear to auscultation. Distal pulses are well felt. Neurological Exam :  Awake alert oriented x 3 normal speech and language. Mild left lower face asymmetry. Tongue midline. Mild left hemiparesis 4/5 and drift of left arm. Mild diminished fine finger movements on left. Orbits right over left upper extremity. Mild left grip weak.. Normal sensation . Normal coordination.  ASSESSMENT/PLAN [name] is a 69 y.o. male with history of previous strokes with resultant LLE weakness who developed worsening recurrent L sided deficits which are improving to baseline. Possible  infarct.  Stroke:   Possible new R MCA infarct vs recrudescence of prior symptoms, likely embolic secondary to AF on subtherapeutic IV heparin while off Eliquis awaiting procedure  Code Stroke CT head No acute abnormality. Old R MCA, old L basal ganglia and cerebral white matter infarcts. ASPECTS 10.     MRI  Unable to tolerate  Repeat CT head: No acute or interval finding. Remote infarct at the right MCA anterior division and right ACA territory  2D Echo EF 20-25%. Severe LVH. LA severely dilated. Pacing wires. No source of embolus seen  LDL 63  HgbA1c 4.6  VTE prophylaxis - IV heparin  Eliquis (apixaban) daily prior to admission but not completely compliant,  on heparin IV.  Can transition to Eliquis after any procedures completed.  Therapy recommendations:  HH PT  Disposition:  pending   Tremor - resolved    New onset LUE tremor - weakness versus encephalopathy  Tremor would stop with movement  Resolved   Persistent Atrial Fibrillation w/ RVR  Home anticoagulation:  Eliquis (apixaban) daily but not completely compliant  On IV heparin past few days d/t plans for DCCV and TEE - procedures on hold for now   Off amio. Now on norepi and dobutamine  . Continue Eliquis (apixaban) daily at discharge   Hypotension Hx Hypertension  Avoid low BP from stroke standpoint . Ok from stroke to have Permissive hypertension (OK if < 220/120) but gradually normalize in 5-7 days . Long-term BP goal normotensive  Hyperlipidemia  Home meds:  lipitor 20  Now on lipitor 40  LDL 63, goal < 70  Continue statin at discharge  Other Stroke Risk Factors  Advanced Age >/= 44   Former Cigarette smoker  Hx ETOH use, advised to drink no more than 2 drink(s) a day  Hx Substance abuse -  Hx cocaine I the past. Reports 2x week THC. UDS not performed  Hx stroke/TIA  10/2018 -  Lg L MCA infarct w/ hemorrhagic conversion, embolic d/t AF on Eliquis s/p reversal w/ KCentra. ICU on 3%.  D/c to CIR. eliquis resumed once hemorrhage resolved. lipitor 20  08/2016 - L MCA s/p IR for L M2 occlusion with taking 3 reperfusion. EF 30 to 35%. Repeat CT no acute abnormality. Did not do MRI due to ICD. LDL 44, A1c 4.7. His Xarelto changed to Eliquis and aspirin.  Family hx stroke (maternal aunt)  Acute on chronic Congestive heart failure d/t NICM->cardiogenic shock.  Non-ischemic cardiomyopathy w/ EF 15-20% s/p ICD  Nov 2015   Other Active Problems  Acute on CKD IIIb  Elevated troponin   Chronic cholecystitis  Fe def anemia  Depression  Hospital day # 6  Stroke will sign off. Please call us if you have any  additional questions or concerns.   Personally examined patient and images, and have participated in and made any corrections needed to history, physical, neuro exam,assessment and plan as stated above.  I have personally obtained the history, evaluated lab date, reviewed imaging studies and agree with radiology interpretations.   I spent 25 minutes of face-to-face and non-face-to-face time with patient. This included prechart review, lab review, study review, order entry, electronic health record documentation, patient education on the different diagnostic and therapeutic options, counseling and coordination of care, risks and benefits of management, compliance, or risk factor reduction   To contact Stroke Continuity provider, please refer to [email or web address] After hours, contact General Neurology

## 2020-01-01 NOTE — Evaluation (Signed)
Speech Language Pathology Evaluation Patient Details Name: Glenn [name] MRN: [id number] DOB: 01-Jun-1950 Today's Date: 01/01/2020 Time: 1000-1020 SLP Time Calculation (min) (ACUTE ONLY): 20 min  Problem List:  Patient Active Problem List   Diagnosis Date Noted  . Anxiety 09/02/2019  . Malnutrition of moderate degree 07/14/2019  . Palliative care by specialist   . Atrial fibrillation with RVR ([place]) 06/23/2019  . Chronic systolic heart failure ([place]) 06/21/2019  . Persistent atrial fibrillation with rapid ventricular response ([place])   . Thrombus of left atrial appendage   . Left leg pain 03/27/2019  . History of hemorrhagic stroke with residual hemiparesis ([place]) 02/04/2019  . Left leg DVT ([place]) 02/04/2019  . Chronic cholecystitis 02/04/2019  . Acute deep vein thrombosis (DVT) of both lower extremities ([name])   . Acute blood loss anemia   . Labile blood glucose   . CKD (chronic kidney disease) stage 3, GFR 30-59 ml/min ([name])   . Chronic right hip pain   . Headache due to intracranial disease 11/14/2018  . Trochanteric bursitis, right hip 11/14/2018  . Cerebral edema ([place]) 11/13/2018  . Hyperlipidemia LDL goal <70 11/13/2018  . Marijuana user 11/13/2018  . Right middle cerebral artery stroke ([place]) 11/13/2018  . Acute CVA (cerebrovascular accident) ([place])   . Noncompliance   . Dysphagia, post-stroke   . Acute on chronic combined systolic and diastolic CHF (congestive heart failure) ([place])   . DCM (dilated cardiomyopathy) ([place])   . Atrial fibrillation ([place]) 11/04/2018  . Elevated troponin   . Acute respiratory failure with hypoxia ([place])   . Entrapment of right ulnar nerve 02/28/2018  . Carpal tunnel syndrome of right wrist 02/28/2018  . Impotence due to erectile dysfunction 09/30/2017  . Solitary pulmonary nodule 06/10/2017  . Neck pain 04/06/2017  . Paroxysmal atrial fibrillation ([name])   . ICD (implantable cardioverter-defibrillator) in place 09/13/2016  . Chest pain  09/13/2016  . Tobacco abuse 09/13/2016  . Upper back pain 09/13/2016  . Housing problems 09/13/2016  . Ischemic cardiomyopathy   . Cerebral infarction ([place])   . Stroke (cerebrum) ([name]) Lg L MCA infarct w/ hemorrhagic conversion, embolic d/t AF 41/93/7902  . HTN (hypertension) 08/14/2016  . Chronic Hepatitis C  08/14/2016   Past Medical History:  Past Medical History:  Diagnosis Date  . Acute blood loss anemia   . Acute CVA (cerebrovascular accident) ([place])   . Acute deep vein thrombosis (DVT) of both lower extremities ([name])   . Acute kidney injury ([place])   . Acute on chronic combined systolic and diastolic CHF (congestive heart failure) ([place])   . Acute renal failure superimposed on stage 3a chronic kidney disease ([place])   . Acute respiratory failure with hypoxia ([place])   . Atrial fibrillation ([place])   . Carpal tunnel syndrome of right wrist 02/28/2018  . Cerebral edema ([place]) 11/13/2018  . Cerebral infarction ([place])   . CHF (congestive heart failure) ([place])   . Cholecystitis 02/04/2019  . Chronic right hip pain   . DCM (dilated cardiomyopathy) ([place])   . Dysphagia, post-stroke   . Elevated troponin   . Entrapment of right ulnar nerve 02/28/2018  . Headache due to intracranial disease 11/14/2018  . Hepatitis C   . History of hemorrhagic stroke with residual hemiparesis ([place]) 02/04/2019  . HTN (hypertension) 08/14/2016  . Hyperlipidemia LDL goal <70 11/13/2018  . Hypertension   . ICD (implantable cardioverter-defibrillator) in place 09/13/2016  . Impotence due to erectile dysfunction 09/30/2017  . Ischemic cardiomyopathy   .  Labile blood glucose   . Left leg DVT ([place]) 02/04/2019  . Marijuana user 11/13/2018  . Paroxysmal atrial fibrillation ([name])   . Right middle cerebral artery stroke ([place]) 11/13/2018  . Solitary pulmonary nodule 06/10/2017   5 mm RUL nodule noted incidentally as part of CVA workup 08/2016. With smoking history would obtain low-dose CT scan 08/2017.   . Stroke (cerebrum)  ([name]) Lg L MCA infarct w/ hemorrhagic conversion, embolic d/t AF 1/60/7371  . Stroke ([place])   . Trochanteric bursitis, right hip 11/14/2018  . Visit for monitoring Tikosyn therapy 03/26/2017   Past Surgical History:  Past Surgical History:  Procedure Laterality Date  . CARDIAC DEFIBRILLATOR PLACEMENT  2015  . CARDIOVERSION N/A 10/10/2016   Procedure: CARDIOVERSION;  Surgeon: Dorothy Spark, MD;  Location: [facility];  Service: Cardiovascular;  Laterality: N/A;  . CARDIOVERSION N/A 03/27/2017   Procedure: CARDIOVERSION;  Surgeon: Jerline Pain, MD;  Location: [facility];  Service: Cardiovascular;  Laterality: N/A;  . CARDIOVERSION N/A 10/29/2018   Procedure: CARDIOVERSION;  Surgeon: Sanda Klein, MD;  Location: [facility];  Service: Cardiovascular;  Laterality: N/A;  . CARDIOVERSION N/A 11/05/2018   Procedure: CARDIOVERSION;  Surgeon: Acie Fredrickson Wonda Cheng, MD;  Location: [facility] ENDOSCOPY;  Service: Cardiovascular;  Laterality: N/A;  . CARDIOVERSION N/A 07/02/2019   Procedure: CARDIOVERSION;  Surgeon: Jolaine Artist, MD;  Location: [facility];  Service: Cardiovascular;  Laterality: N/A;  . CARDIOVERSION N/A 07/14/2019   Procedure: CARDIOVERSION;  Surgeon: Jolaine Artist, MD;  Location: [facility] ENDOSCOPY;  Service: Cardiovascular;  Laterality: N/A;  . CARDIOVERSION N/A 12/30/2019   Procedure: CARDIOVERSION;  Surgeon: Jolaine Artist, MD;  Location: [facility];  Service: Cardiovascular;  Laterality: N/A;  . EYE SURGERY Left 1990  . IABP INSERTION N/A 06/26/2019   Procedure: IABP INSERTION;  Surgeon: Jolaine Artist, MD;  Location: [facility] CV LAB;  Service: Cardiovascular;  Laterality: N/A;  . IR CATHETER TUBE CHANGE  06/24/2019  . IR EXCHANGE BILIARY DRAIN  02/11/2019  . IR EXCHANGE BILIARY DRAIN  06/09/2019  . IR EXCHANGE BILIARY DRAIN  09/23/2019  . IR IVC FILTER PLMT / S&I /IMG GUID/MOD SED  12/04/2018  . IR PERC CHOLECYSTOSTOMY  12/13/2018  . IR PERCUTANEOUS ART THROMBECTOMY/INFUSION  INTRACRANIAL INC DIAG ANGIO  09/05/2016  . IR RADIOLOGIST EVAL & MGMT  10/03/2016  . RADIOLOGY WITH ANESTHESIA N/A 09/05/2016   Procedure: RADIOLOGY WITH ANESTHESIA;  Surgeon: Luanne Bras, MD;  Location: [facility];  Service: Radiology;  Laterality: N/A;  . RIGHT HEART CATH N/A 06/24/2019   Procedure: RIGHT HEART CATH;  Surgeon: Jolaine Artist, MD;  Location: [facility] CV LAB;  Service: Cardiovascular;  Laterality: N/A;  . RIGHT/LEFT HEART CATH AND CORONARY ANGIOGRAPHY N/A 11/03/2018   Procedure: RIGHT/LEFT HEART CATH AND CORONARY ANGIOGRAPHY;  Surgeon: Lorretta Harp, MD;  Location: [facility] CV LAB;  Service: Cardiovascular;  Laterality: N/A;  . TEE WITHOUT CARDIOVERSION N/A 02/06/2019   Procedure: TRANSESOPHAGEAL ECHOCARDIOGRAM (TEE);  Surgeon: Skeet Latch, MD;  Location: [facility];  Service: Cardiovascular;  Laterality: N/A;  . TEE WITHOUT CARDIOVERSION N/A 06/09/2019   Procedure: TRANSESOPHAGEAL ECHOCARDIOGRAM (TEE);  Surgeon: Buford Dresser, MD;  Location: [facility] ENDOSCOPY;  Service: Cardiovascular;  Laterality: N/A;  . TEE WITHOUT CARDIOVERSION N/A 12/30/2019   Procedure: TRANSESOPHAGEAL ECHOCARDIOGRAM (TEE);  Surgeon: Jolaine Artist, MD;  Location: [facility] OR;  Service: Cardiovascular;  Laterality: N/A;   HPI:  [name] is a 69 y.o. male with history of previous  strokes with resultant LLE weakness who developed worsening recurrent L sided deficits which are improving to baseline. Possible infarct; CT negative, MRI pending. He developed  Slurred speech and left sided weakness while Eliquis was held for elective cardioversion and he was on IV heparin due to subtherapeutic level.  PMHx: hemorrhagic stroke with left weakness, PAF, Ischemic cardiomyopathy, DVT, HTN, HLD. Pt was last seen by SLP 12/18/18 with recommendation of regular texture solids and thin liquids   Assessment / Plan / Recommendation Clinical Impression  Pt presents with a mild dysarthria  due to left CN VII weakness though fully intelligible. Additionally pt noted to have some neglect of left upper extremity and visual field during functional tasks requiring occasional cueing to look left of use left hand. Pt also demonstrated some difficulty with working memory and retrieval tasks with short term memory. Needs reminders of am events, asks questions repeatedly, needed category and choice cues to recall 4/4 words in a memory task. Recommend f/u SLP acutely; could benefit from a short CIR stay given support at home.     SLP Assessment  SLP Recommendation/Assessment: Patient needs continued Speech Lanaguage Pathology Services SLP Visit Diagnosis: Dysarthria and anarthria (R47.1)    Follow Up Recommendations  Inpatient Rehab    Frequency and Duration min 2x/week  2 weeks      SLP Evaluation Cognition  Overall Cognitive Status: Impaired/Different from baseline Arousal/Alertness: Awake/alert Orientation Level: Oriented to person;Oriented to place;Oriented to time;Disoriented to situation Attention: Alternating Alternating Attention: Appears intact Memory: Impaired Memory Impairment: Retrieval deficit;Decreased short term memory Decreased Short Term Memory: Verbal basic Awareness: Impaired Awareness Impairment: Emergent impairment Problem Solving: Appears intact Safety/Judgment: Impaired       Comprehension  Auditory Comprehension Overall Auditory Comprehension: Appears within functional limits for tasks assessed    Expression Verbal Expression Overall Verbal Expression: Appears within functional limits for tasks assessed   Oral / Motor  Oral Motor/Sensory Function Overall Oral Motor/Sensory Function: Mild impairment Facial ROM: Reduced left;Suspected CN VII (facial) dysfunction Facial Symmetry: Abnormal symmetry left;Suspected CN VII (facial) dysfunction Facial Strength: Reduced left;Suspected CN VII (facial) dysfunction Facial Sensation: Within Functional  Limits Lingual ROM: Within Functional Limits Lingual Symmetry: Within Functional Limits Lingual Strength: Within Functional Limits Lingual Sensation: Within Functional Limits Velum: Within Functional Limits Mandible: Within Functional Limits Motor Speech Overall Motor Speech: Impaired Respiration: Within functional limits Phonation: Normal Resonance: Within functional limits Articulation: Impaired Level of Impairment: Conversation Intelligibility: Intelligible Motor Planning: Witnin functional limits Motor Speech Errors: Consistent;Aware   GO                   Herbie Baltimore, MA CCC-SLP  Acute Rehabilitation Services Pager [phone] Office [phone]  Lynann Beaver 01/01/2020, 10:49 AM

## 2020-01-01 NOTE — Progress Notes (Signed)
[place] for heparin Indication: atrial fibrillation  Allergies  Allergen Reactions  . Benadryl [Diphenhydramine] Palpitations    Patient Measurements: Height: 5\' 7"  (170.2 cm) Weight: 77.6 kg (171 lb 1.2 oz) IBW/kg (Calculated) : 66.1 Heparin Dosing Weight: 78 kg  Vital Signs: Temp: 98.6 F (37 C) (11/12 0045) Temp Source: Bladder (11/11 2000) BP: 117/65 (11/12 0045) Pulse Rate: 89 (11/12 0045)  Labs: Recent Labs    12/30/19 0813 12/30/19 0857 12/30/19 1743 12/31/19 0334 12/31/19 1540 12/31/19 1540 12/31/19 1837 01/01/20 0352  HGB  --   --   --    < > 9.5*   < > 10.2* 9.9*  HCT  --   --   --    < > 30.0*  --  32.5* 31.1*  PLT  --   --   --    < > 305  --  305 273  APTT   < >  --  49*   < > 91*  --  73* 71*  LABPROT  --  17.4* 16.5*  --   --   --  15.1  --   INR  --  1.5* 1.4*  --   --   --  1.2  --   HEPARINUNFRC  --   --  1.60*   < > 1.18*  --  1.34* 0.90*  CREATININE  --   --   --    < > 2.47*  --  2.84* 2.79*   < > = values in this interval not displayed.    Estimated Creatinine Clearance: 24 mL/min (A) (by C-G formula based on SCr of 2.79 mg/dL (H)).   Medical History: Past Medical History:  Diagnosis Date  . Acute blood loss anemia   . Acute CVA (cerebrovascular accident) ([place])   . Acute deep vein thrombosis (DVT) of both lower extremities ([name])   . Acute kidney injury ([place])   . Acute on chronic combined systolic and diastolic CHF (congestive heart failure) ([place])   . Acute renal failure superimposed on stage 3a chronic kidney disease ([place])   . Acute respiratory failure with hypoxia ([place])   . Atrial fibrillation ([place])   . Carpal tunnel syndrome of right wrist 02/28/2018  . Cerebral edema ([place]) 11/13/2018  . Cerebral infarction ([place])   . CHF (congestive heart failure) ([place])   . Cholecystitis 02/04/2019  . Chronic right hip pain   . DCM (dilated cardiomyopathy) ([place])   . Dysphagia, post-stroke   . Elevated  troponin   . Entrapment of right ulnar nerve 02/28/2018  . Headache due to intracranial disease 11/14/2018  . Hepatitis C   . History of hemorrhagic stroke with residual hemiparesis ([place]) 02/04/2019  . HTN (hypertension) 08/14/2016  . Hyperlipidemia LDL goal <70 11/13/2018  . Hypertension   . ICD (implantable cardioverter-defibrillator) in place 09/13/2016  . Impotence due to erectile dysfunction 09/30/2017  . Ischemic cardiomyopathy   . Labile blood glucose   . Left leg DVT ([place]) 02/04/2019  . Marijuana user 11/13/2018  . Paroxysmal atrial fibrillation ([name])   . Right middle cerebral artery stroke ([place]) 11/13/2018  . Solitary pulmonary nodule 06/10/2017   5 mm RUL nodule noted incidentally as part of CVA workup 08/2016. With smoking history would obtain low-dose CT scan 08/2017.   . Stroke (cerebrum) ([name]) Lg L MCA infarct w/ hemorrhagic conversion, embolic d/t AF 6/43/3295  . Stroke ([place])   . Trochanteric bursitis, right hip 11/14/2018  . Visit for  monitoring Tikosyn therapy 03/26/2017     Assessment: 64 yoM admitted with ADHF and AFib now with worsening cardiogenic shock. Pt on apixaban for AFib (last dose 0900 11/9), now to transition to IV heparin in anticipation of invasive procedures.  Code stroke called 11/10, CT head negative for bleed. Heparin resumed per neuro (adjusted goals, no further boluses). aPTT this morning is therapeutic at 71 seconds, heparin level trending down as DOAC clears but still altered. H/H and pltc stable.  Goal of Therapy:  Heparin level 0.3-0.5 units/ml aPTT 66-85 seconds Monitor platelets by anticoagulation protocol: Yes   Plan: Continue heparin to 1550 units/h Daily aPTT and heparin level Follow neuro status and CBC   Arrie Senate, PharmD, BCPS Clinical Pharmacist [phone] Please check AMION for all [place] numbers 01/01/2020

## 2020-01-01 NOTE — Progress Notes (Signed)
OT Cancellation Note  Patient Details Name: Glenn [name] MRN: [id number] DOB: 11/17/1950   Cancelled Treatment:    Reason Eval/Treat Not Completed: Fatigue/lethargy limiting ability to participate (pt sleeping x 2 attempts, will return)  Malka So 01/01/2020, 1:29 PM  Nestor Lewandowsky, OTR/L Acute Rehabilitation Services Pager: [phone] Office: [phone]

## 2020-01-01 NOTE — Progress Notes (Addendum)
Advanced Heart Failure Rounding Note  PCP-Cardiologist: Virl Axe, MD   Subjective:    PICC placed 11/6 with initial CO-OX 31% and started Milrinone 0.25. On 11/8 txf ICU due to Co-ox remaining 30-40% on milrinone & started NE.  11/10: Code Stroke for L facial droop w/ slurred speech, TEE DC-CV canceled.   Yesterday:Remained on dobutamine 3 mcg for co-ox 56% with amio 60/heparin gtt for AF. Stopped lasix 20 gtt, went to MRI then developed hypotension, hypoxia and AF w/ rates 50-100s.    Today: CVP 12, co-ox 56%, sitting up in chair.  Alert and oriented x 3, reporting no complaints (denies headache, dizziness, chest pain or shortness of breath).  His tremors/arm jerks have improved.     Objective:   Weight Range: 77.6 kg Body mass index is 26.79 kg/m.   Vital Signs:   Temp:  [96.6 F (35.9 C)-99.5 F (37.5 C)] 98.6 F (37 C) (11/12 0045) Pulse Rate:  [30-163] 89 (11/12 0045) Resp:  [13-32] 21 (11/12 0045) BP: (63-179)/(36-156) 117/65 (11/12 0045) SpO2:  [67 %-100 %] 97 % (11/12 0045) Last BM Date: 12/27/19  Weight change: Filed Weights   12/28/19 0420 12/29/19 0544 12/30/19 0444  Weight: 77.1 kg 77.9 kg 77.6 kg    Intake/Output:   Intake/Output Summary (Last 24 hours) at 01/01/2020 0651 Last data filed at 01/01/2020 0500 Gross per 24 hour  Intake 2207.46 ml  Output 2001 ml  Net 206.46 ml      Physical Exam   CVP 12 General:  Well appearing. No resp difficulty.  HEENT: normal.  L facial droop improving, minimal flattening of nasolabial fold.  Neck: supple. no JVD. Carotids 2+ bilat; no bruits. No lymphadenopathy or thryomegaly appreciated. Cor: PMI nondisplaced. Iregular rate & rhythm. No rubs, or gallops. + murmurs. Lungs: clear bilaterally.   Abdomen: soft, nontender, nondistended. No hepatosplenomegaly. No bruits or masses. Bowel sounds active in all 4 quadrants.  Foley present w/ clear urine.   Extremities: no cyanosis, clubbing, rash, edema.   Neuro: alert & oriented x 3. moves all 4 extremities w/o difficulty. Affect pleasant.    Telemetry   Afib rates 90-110s, with episode of 140s overnight.  Personally reviewed.   Labs    CBC Recent Labs    12/31/19 1540 12/31/19 1540 12/31/19 1837 01/01/20 0352  WBC 7.5   < > 9.2 9.1  NEUTROABS 5.3  --  6.7  --   HGB 9.5*   < > 10.2* 9.9*  HCT 30.0*   < > 32.5* 31.1*  MCV 89.8   < > 91.0 89.1  PLT 305   < > 305 273   < > = values in this interval not displayed.   Basic Metabolic Panel Recent Labs    12/31/19 1540 12/31/19 1540 12/31/19 1837 01/01/20 0352  NA 132*   < > 130* 132*  K 4.1   < > 4.9 3.8  CL 95*   < > 90* 89*  CO2 28   < > 29 29  GLUCOSE 220*   < > 178* 186*  BUN 21   < > 24* 24*  CREATININE 2.47*   < > 2.84* 2.79*  CALCIUM 8.0*   < > 9.0 9.0  MG 1.8  --  1.9  --    < > = values in this interval not displayed.   Liver Function Tests Recent Labs    12/31/19 1540  AST 42*  ALT 132*  ALKPHOS 88  BILITOT 1.3*  PROT 6.5  ALBUMIN 3.1*   No results for input(s): LIPASE, AMYLASE in the last 72 hours. Cardiac Enzymes No results for input(s): CKTOTAL, CKMB, CKMBINDEX, TROPONINI in the last 72 hours.  BNP: BNP (last 3 results) Recent Labs    06/01/19 1413 06/23/19 1527 12/25/19 1757  BNP 531.4* 613.2* 776.7*    ProBNP (last 3 results) No results for input(s): PROBNP in the last 8760 hours.   D-Dimer No results for input(s): DDIMER in the last 72 hours. Hemoglobin A1C No results for input(s): HGBA1C in the last 72 hours. Fasting Lipid Panel No results for input(s): CHOL, HDL, LDLCALC, TRIG, CHOLHDL, LDLDIRECT in the last 72 hours. Thyroid Function Tests No results for input(s): TSH, T4TOTAL, T3FREE, THYROIDAB in the last 72 hours.  Invalid input(s): FREET3  Other results:   Imaging    DG CHEST PORT 1 VIEW  Result Date: 12/31/2019 CLINICAL DATA:  69 year old who had a recent stroke. While in MRI earlier today, the patient  sustained an episode of hypoxia, hypotension and bradycardia. Indwelling pacing defibrillator. EXAM: PORTABLE CHEST 1 VIEW COMPARISON:  12/27/2019 and earlier, including CT chest 12/11/2018. FINDINGS: Cardiac silhouette markedly enlarged, unchanged. LEFT subclavian pacing defibrillator unchanged and appears intact. RIGHT arm PICC tip projects over the mid SVC, unchanged. Since the examination 4 days ago, development of pulmonary venous hypertension and minimal interstitial pulmonary edema. No confluent or ground-glass airspace consolidation. Possible small RIGHT pleural effusion. IMPRESSION: Stable marked cardiomegaly. Developing pulmonary venous hypertension and minimal interstitial pulmonary edema since the examination 4 days ago, indicating CHF and/or fluid overload. Electronically Signed   By: Evangeline Dakin M.D.   On: 12/31/2019 18:49     Medications:     Scheduled Medications: . atorvastatin  40 mg Oral QPM  . Chlorhexidine Gluconate Cloth  6 each Topical Daily  . feeding supplement  237 mL Oral BID BM  . gabapentin  300 mg Oral Daily  . pantoprazole  40 mg Oral Daily  . potassium chloride  40 mEq Oral Daily  . potassium chloride  60 mEq Oral Once  . sodium chloride flush  10-40 mL Intracatheter Q12H    Infusions: . sodium chloride 10 mL/hr at 12/31/19 1900  . amiodarone 30 mg/hr (01/01/20 0519)  . DOBUTamine 3 mcg/kg/min (01/01/20 0400)  . furosemide (LASIX) 200 mg in dextrose 5% 100 mL (2mg /mL) infusion 20 mg/hr (01/01/20 0400)  . heparin 1,550 Units/hr (01/01/20 0411)  . norepinephrine (LEVOPHED) Adult infusion 14 mcg/min (01/01/20 0650)    PRN Medications: acetaminophen, calcium carbonate, ondansetron (ZOFRAN) IV, sodium chloride flush    Patient Profile  69 y.o.malewith a hx of chronic systolic HF 2/2 NICM s/pBSciICD, persistent atrial fibrillation, h/o stroke (Lg L MCA infarct w/ hemorrhagic conversionin 2018), HTN, HCV, tobacco abuse (smokes ~1/4 ppd),  and  chronic cholecystitiswith achronic perc drain.   Admitted with ADHF. Started on milrinone.    Assessment/Plan   1. Acute on chronic systolic HF due to NICM -> cardiogenic shock - suspect AF cardiomyopathy - ICD placed Nov 2015 - [facility] 10/2018 showed normal cors, elevated filling pressures and low CI at 1.9 - TTE 11/2018 LVEF 20%, mild RV dysfunction - Admit 06/2019 with severe HF, required IABP and milrinone. TEE 5/21 EF 20-25% with moderate to severe RV dysfunction.  - Admitted 12/25/2019 with ADHF. Milrinone 0.25 mcg started 11/7 due to low output HF.  - Echo 12/28/19 EF 20-25% RV mildly decreased  - Moved to ICU on 11/8 due to persistently low co-ox on  milrinone. - 12/29/19: Continued w/ persistently low co-ox despite milrinone 0.5 and NE which was stopped d/t elevated MAPs. Gave metolazone 2/2 low UOP. Transitioned to dobutamine gtt starting at 2.5 mcg then increased to 3 mcg.  - Suspect RV function worse than it looks on echo.  - Today: CVP 12, co-ox 56% - CVP goal 8-12 - Continue dobutamine at 3 - Transition from lasix gtt 20 to torsemide 60 BID.   - Try to wean levophed as tolerates avoiding low BP from a stroke standpoint.  - Replete potassium and magnesium.  - No beta blockers due to low output HF - No dig or ACE/ARB/ARNI with worsening renal function.  - Options limited - poor LVAD candidate with noncompliance and renal failure. He did not make one visit to HF or EP clinic after dramatic hospitalization for AF and cardiogenic shock in 5/21  2. Concerning for acute ischemic R MCA -11/10 around 14:00 developed new L facial droop, LUE weakness and slurred speech - Code Stroke called. -hx of large R MCA with hemorrhagic conversion in Sept 2020 & L MCA s/p thrombectomy M2 occlusion in 2018.  - A1c 4.6%, LDL 63, HDL 26, Cholesterol 103, Triglycerides 68 -Neuro recs appreciated - Unable to tolerate change in ICD settings for MRI and MRA, spoke with neurology recommended repeat CT  which showed no acute changes but remote R MCA.  - Atrovastatin dose increased - Reduced dose frequency of gabapentin in the setting of new tremors and AKI  3.  Persistent Afib with RVR - In 5/21 had refractory AF requiring multiple DC-CVs, high-dose amio and ranexa. He reported intermittent compliance with amio and Eliquis. Out of eliquis prior to admission.  - Transitioned from eliquis to heparin gtt due to possible need for procedures on 11/9 and planned for TEE DC-CV was scheduled 11/10, however canceled due to new neuro symptoms. - Stopped ranexa on 11/9. - Continue with heparin gtt (aPTT 71) and amiodarone 30    4. Acute on CKD IIIb - Baseline Cr looks to be around 1.6 to 1.7 - Admission Cr 1.69 - Peak Cr 2.84 - Today Cr 2.8, good UOP - Likely ATN/cardiorenal - Continue to monitor BMET   5. Elevated troponin - 10/2018 cath clean coronaries - elevated trops due to afib with RVR, CHF  6. Chronic cholecystitis with perc drain -  Per primary team. Perc drain now fell out -  Will follow up outpatient   7. Iron deficiency anemia - Iron sats low.  - Has received feraheme  8. Depression - Watch QT closely on amio.  - Stopped zoloft 11/10 d/t prolong QT.   - Ranexa stopped 11/9.    Advanced Heart Failure Team Pager [phone] (M-F; [place])  Please contact [facility] for night-coverage after hours (4p -7a ) and weekends on [email or web address]  Carlene Coria NP 6:51 AM  Length of Stay: 6   Agree with above.   He remains very tenuous. Yesterday very unstable hemodynamically with severe hypotension. Got a little fluid back and NE increased. Now improving and able to start weaning NE again. CVP 12. Co-ox 56%. Remains in AF on IV amio. Rate now controlled. Stroke symptoms improved.   General:  Sitting up in chair No resp difficulty HEENT: normal x mild L facial droop Neck: supple. JVP to jaw. Carotids 2+ bilat; no bruits. No lymphadenopathy or thryomegaly  appreciated. Cor: PMI nondisplaced. Irregular rate & rhythm. +s3 Lungs: clear Abdomen: soft, nontender, nondistended. No hepatosplenomegaly. No bruits  or masses. Good bowel sounds. Extremities: no cyanosis, clubbing, rash, edema Neuro: alert & orientedx3, cranial nerves grossly intact. moves all 4 extremities w/o difficulty. Affect pleasant  Hemodynamically more stable today. Would wean NE as tolerated. Can switch IV lasix to torsemide 60 bid. Try to keep CVP 8-12. Continue IV amio and heparin. Given inability to tolerate AF with cardiogenic shock will need to again consider attempted TEE & DC-CV despite recent acute CVA. Not candidate for advanced therapies.   CRITICAL CARE Performed by: Glori Bickers  Total critical care time: 45 minutes  Critical care time was exclusive of separately billable procedures and treating other patients.  Critical care was necessary to treat or prevent imminent or life-threatening deterioration.  Critical care was time spent personally by me (independent of midlevel providers or residents) on the following activities: development of treatment plan with patient and/or surrogate as well as nursing, discussions with consultants, evaluation of patient's response to treatment, examination of patient, obtaining history from patient or surrogate, ordering and performing treatments and interventions, ordering and review of laboratory studies, ordering and review of radiographic studies, pulse oximetry and re-evaluation of patient's condition.  Glori Bickers, MD  10:20 AM

## 2020-01-01 NOTE — Progress Notes (Signed)
Initial Nutrition Assessment  DOCUMENTATION CODES:   Not applicable  INTERVENTION:   Ensure Enlive po BID between meals, each supplement provides 350 kcal and 20 grams of protein  Boost Breeze po TID, each supplement provides 250 kcal and 9 grams of protein   NUTRITION DIAGNOSIS:   Inadequate oral intake related to decreased appetite, nausea as evidenced by per patient/family report, meal completion < 50%.  GOAL:   Patient will meet greater than or equal to 90% of their needs  MONITOR:   PO intake, Supplement acceptance, Labs, Weight trends  REASON FOR ASSESSMENT:   Malnutrition Screening Tool    ASSESSMENT:   69 yo male admitted with acute on chronic CHF due to NICM with cardiogenic shock. PMH includes chronic CHF, hx of stroke (large L MCA infart with hemorrhagic conversion in 2018), HTN, chronic cholecystitis with chronic perc drain.   11/10 Code stroke for L facial droop and slurred speech  Pt remains tenuous, remains on pressors  Recorded po intake 0-100%. Appetite has been good but noted pt with some N/V, early satiety.   Pt with order for Ensure BID. Drinking some but at times refusing. Pt has consumed Boost Breeze in the past, RD to order this as well  Current wt 77 kg; weight has been stable since 11/06 but net negative 5L per I/O flow sheet  Chronic perc drain fell out; holding on reinsertion  Labs: sodium 132 (L) Meds: reviewed    Diet Order:   Diet Order            Diet Heart Room service appropriate? Yes; Fluid consistency: Thin; Fluid restriction: 1800 mL Fluid  Diet effective now                 EDUCATION NEEDS:   Not appropriate for education at this time  Skin:  Skin Assessment: Skin Integrity Issues: Skin Integrity Issues:: Incisions Incisions: site of perc drain (felt out this admission) draining purulent draiange  Last BM:  11/7  Height:   Ht Readings from Last 1 Encounters:  12/26/19 5\' 7"  (1.702 m)    Weight:   Wt  Readings from Last 1 Encounters:  01/01/20 77.3 kg    BMI:  Body mass index is 26.69 kg/m.  Estimated Nutritional Needs:   Kcal:  2200-2400 kcals  Protein:  110-120 g  Fluid:  1.8 L   Kerman Passey MS, RDN, LDN, CNSC Registered Dietitian III Clinical Nutrition RD Pager and On-Call Pager Number Located in [place]

## 2020-01-01 NOTE — Evaluation (Signed)
Occupational Therapy Re-Evaluation Patient Details Name: Glenn [name] MRN: [id number] DOB: 31-Jan-1951 Today's Date: 01/01/2020    History of Present Illness Glenn [name] is a 69 y.o. male with a hx of chronic systolic HF 2/2 NICM s/p BSci ICD, atrial fibrillation, h/o stroke (Lg L MCA infarct w/ hemorrhagic conversion in 2018), h/o DVT, chornic a/c therapy, HTN, HLD, pulmonary nodule, Hepatitis C, carpal tunnel,  tobacco abuse (smokes ~1/4 ppd) and chronic cholecystitis with a chronic perc drain, who is being seen today for the evaluation of Afib RVR and CHF at the request of Dr Regenia Skeeter. Pt experienced onset of L side weakness on 12/30/19, CT negative for acute changes, pt cannot have an MRI.   Clinical Impression   Pt is typically independent in ADL and mobility and is assisted by his son for IADL. Pt presents with L side weakness and unsteady gait. He needs up to min assist for ADL and min guard with RW for ambulation. Will follow acutely. Recommending home with [place] upon discharge.    Follow Up Recommendations  Home health OT;Supervision - Intermittent    Equipment Recommendations  None recommended by OT    Recommendations for Other Services       Precautions / Restrictions Precautions Precautions: Fall      Mobility Bed Mobility               General bed mobility comments: pt received in bed    Transfers Overall transfer level: Needs assistance Equipment used: Rolling walker (2 wheeled) Transfers: Sit to/from Stand Sit to Stand: Min guard         General transfer comment: min guard for safety and lines    Balance Overall balance assessment: Needs assistance   Sitting balance-Leahy Scale: Good       Standing balance-Leahy Scale: Poor Standing balance comment: reliant on RW for dynamic balance                           ADL either performed or assessed with clinical judgement   ADL Overall ADL's : Needs  assistance/impaired Eating/Feeding: Independent   Grooming: Min guard;Standing   Upper Body Bathing: Minimal assistance;Sitting   Lower Body Bathing: Minimal assistance;Sit to/from stand   Upper Body Dressing : Minimal assistance;Sitting   Lower Body Dressing: Minimal assistance;Sit to/from stand   Toilet Transfer: Min guard;RW;Ambulation   Toileting- Water quality scientist and Hygiene: Min guard;Sit to/from stand   Tub/ Shower Transfer: Min guard;Ambulation;Rolling walker   Functional mobility during ADLs: Min guard;Rolling walker       Vision Baseline Vision/History: No visual deficits Patient Visual Report: No change from baseline       Perception     Praxis      Pertinent Vitals/Pain Pain Assessment: No/denies pain     Hand Dominance Right   Extremity/Trunk Assessment Upper Extremity Assessment Upper Extremity Assessment: LUE deficits/detail LUE Deficits / Details: 4/5 shoulder and hand, 5/5 elbow   Lower Extremity Assessment Lower Extremity Assessment: Defer to PT evaluation   Cervical / Trunk Assessment Cervical / Trunk Assessment: Normal   Communication Communication Communication: No difficulties   Cognition Arousal/Alertness: Awake/alert Behavior During Therapy: WFL for tasks assessed/performed Overall Cognitive Status: Within Functional Limits for tasks assessed                                     General Comments  Exercises     Shoulder Instructions      Home Living Family/patient expects to be discharged to:: Private residence Living Arrangements: Children (son) Available Help at Discharge: Family;Available PRN/intermittently Type of Home: Apartment Home Access: Level entry     Home Layout: One level     Bathroom Shower/Tub: Teacher, early years/pre: Standard     Home Equipment: Cane - single point;Walker - standard;Bedside commode      Lives With: Son    Prior Functioning/Environment Level  of Independence: Needs assistance  Gait / Transfers Assistance Needed: using cane ADL's / Homemaking Assistance Needed: independent ADL's, son assists with IADL's            OT Problem List: Decreased strength;Decreased activity tolerance;Impaired balance (sitting and/or standing);Decreased knowledge of use of DME or AE;Cardiopulmonary status limiting activity      OT Treatment/Interventions: Self-care/ADL training;DME and/or AE instruction;Therapeutic activities;Patient/family education;Balance training;Neuromuscular education    OT Goals(Current goals can be found in the care plan section) Acute Rehab OT Goals Patient Stated Goal: return home OT Goal Formulation: With patient Time For Goal Achievement: 01/15/20 Potential to Achieve Goals: Good ADL Goals Pt Will Perform Grooming: with supervision;standing Pt Will Perform Lower Body Bathing: with supervision;sit to/from stand Pt Will Perform Lower Body Dressing: with supervision;sit to/from stand Pt Will Transfer to Toilet: with supervision;ambulating;regular height toilet Pt Will Perform Toileting - Clothing Manipulation and hygiene: with supervision;sit to/from stand Pt Will Perform Tub/Shower Transfer: Tub transfer;with supervision;ambulating;3 in 1 Pt/caregiver will Perform Home Exercise Program: Increased strength;Left upper extremity;Independently;With theraband  OT Frequency: Min 2X/week   Barriers to D/C:            Co-evaluation              AM-PAC OT "6 Clicks" Daily Activity     Outcome Measure Help from another person eating meals?: None Help from another person taking care of personal grooming?: A Little Help from another person toileting, which includes using toliet, bedpan, or urinal?: A Little Help from another person bathing (including washing, rinsing, drying)?: A Little Help from another person to put on and taking off regular upper body clothing?: A Little Help from another person to put on and  taking off regular lower body clothing?: A Little 6 Click Score: 19   End of Session Equipment Utilized During Treatment: Rolling walker;Gait belt  Activity Tolerance: Patient tolerated treatment well Patient left: with call bell/phone within reach;in chair  OT Visit Diagnosis: Other abnormalities of gait and mobility (R26.89);Unsteadiness on feet (R26.81);Muscle weakness (generalized) (M62.81);Hemiplegia and hemiparesis Hemiplegia - Right/Left: Left Hemiplegia - dominant/non-dominant: Non-Dominant Hemiplegia - caused by: Cerebral infarction                Time: 2831-5176 OT Time Calculation (min): 14 min Charges:  OT General Charges $OT Visit: 1 Visit OT Evaluation $OT Eval Moderate Complexity: 1 Mod  Nestor Lewandowsky, OTR/L Acute Rehabilitation Services Pager: [phone] Office: [phone]  Malka So 01/01/2020, 3:07 PM

## 2020-01-01 NOTE — Progress Notes (Signed)
Physical Therapy Re-Evaluation Treatment Patient Details Name: Glenn [name] MRN: [id number] DOB: [birth year] Today's Date: 01/01/2020    History of Present Illness Glenn [name] is a 69 y.o. male with a hx of chronic systolic HF 2/2 NICM s/p BSci ICD, atrial fibrillation, h/o stroke (Lg L MCA infarct w/ hemorrhagic conversion in 2018), h/o DVT, chornic a/c therapy, HTN, HLD, pulmonary nodule, Hepatitis C, carpal tunnel,  tobacco abuse (smokes ~1/4 ppd) and chronic cholecystitis with a chronic perc drain, who is being seen today for the evaluation of Afib RVR and CHF at the request of Dr Regenia Skeeter. Pt experienced onset of L side weakness on 12/30/19, CT negative for acute changes, pt cannot have an MRI.    PT Comments    After episode on 11/10 pt with increased L hip weakness contributing to scissoring with gait requiring min A for steadying in RW. PT plan for HHPT remains appropriate to work on L sided weakness and progressing gait. PT will continue to follow acutely.     Follow Up Recommendations  Supervision for mobility/OOB;Home health PT     Equipment Recommendations  None recommended by PT    Recommendations for Other Services       Precautions / Restrictions Precautions Precautions: Fall Restrictions Weight Bearing Restrictions: No    Mobility  Bed Mobility               General bed mobility comments: pt received in chair  Transfers Overall transfer level: Needs assistance Equipment used: Rolling walker (2 wheeled) Transfers: Sit to/from Stand Sit to Stand: Min guard         General transfer comment: min guard for safety and lines  Ambulation/Gait Ambulation/Gait assistance: Min assist Gait Distance (Feet): 150 Feet Assistive device: 4-wheeled walker Gait Pattern/deviations: Scissoring;Narrow base of support;Decreased weight shift to left;Decreased step length - right;Decreased dorsiflexion - left Gait velocity: decreased Gait velocity  interpretation: 1.31 - 2.62 ft/sec, indicative of limited community ambulator General Gait Details: min A for steadying, pt with ocassional scissoring gait, requiring vc for increased L knee flexion, vc for staying in middle of RW           Balance Overall balance assessment: Needs assistance   Sitting balance-Leahy Scale: Good       Standing balance-Leahy Scale: Poor Standing balance comment: reliant on RW for dynamic balance                            Cognition Arousal/Alertness: Awake/alert Behavior During Therapy: WFL for tasks assessed/performed Overall Cognitive Status: Within Functional Limits for tasks assessed                                               Pertinent Vitals/Pain Pain Assessment: No/denies pain    Home Living Family/patient expects to be discharged to:: Private residence Living Arrangements: Children (son) Available Help at Discharge: Family;Available PRN/intermittently Type of Home: Apartment Home Access: Level entry   Home Layout: One level Home Equipment: Cane - single point;Walker - standard;Bedside commode      Prior Function Level of Independence: Needs assistance  Gait / Transfers Assistance Needed: using cane ADL's / Homemaking Assistance Needed: independent ADL's, son assists with IADL's     PT Goals (current goals can now be found in the care plan section) Acute Rehab PT Goals  Patient Stated Goal: return home PT Goal Formulation: With patient Time For Goal Achievement: 01/10/20 Potential to Achieve Goals: Good Progress towards PT goals: Progressing toward goals    Frequency    Min 3X/week      PT Plan Current plan remains appropriate       AM-PAC PT "6 Clicks" Mobility   Outcome Measure  Help needed turning from your back to your side while in a flat bed without using bedrails?: None Help needed moving from lying on your back to sitting on the side of a flat bed without using bedrails?:  None Help needed moving to and from a bed to a chair (including a wheelchair)?: None Help needed standing up from a chair using your arms (e.g., wheelchair or bedside chair)?: None Help needed to walk in hospital room?: A Little Help needed climbing 3-5 steps with a railing? : A Little 6 Click Score: 22    End of Session Equipment Utilized During Treatment: Gait belt Activity Tolerance: Patient tolerated treatment well Patient left: with call bell/phone within reach;in bed;with nursing/sitter in room Nurse Communication: Mobility status PT Visit Diagnosis: Difficulty in walking, not elsewhere classified (R26.2)     Time: 0102-7253 PT Time Calculation (min) (ACUTE ONLY): 18 min  Charges:   Re-Evaluation (8-24min )                    Unnamed Zeien B. Migdalia Dk PT, DPT Acute Rehabilitation Services Pager [phone] Office [phone]    [place] 01/01/2020, 4:22 PM

## 2020-01-02 DIAGNOSIS — I5043 Acute on chronic combined systolic (congestive) and diastolic (congestive) heart failure: Secondary | ICD-10-CM | POA: Diagnosis not present

## 2020-01-02 DIAGNOSIS — I4819 Other persistent atrial fibrillation: Secondary | ICD-10-CM | POA: Diagnosis not present

## 2020-01-02 DIAGNOSIS — R570 Cardiogenic shock: Secondary | ICD-10-CM | POA: Diagnosis not present

## 2020-01-02 LAB — CBC
HCT: 29.8 % — ABNORMAL LOW (ref 39.0–52.0)
Hemoglobin: 9.4 g/dL — ABNORMAL LOW (ref 13.0–17.0)
MCH: 28.3 pg (ref 26.0–34.0)
MCHC: 31.5 g/dL (ref 30.0–36.0)
MCV: 89.8 fL (ref 80.0–100.0)
Platelets: 239 10*3/uL (ref 150–400)
RBC: 3.32 MIL/uL — ABNORMAL LOW (ref 4.22–5.81)
RDW: 16.2 % — ABNORMAL HIGH (ref 11.5–15.5)
WBC: 6.4 10*3/uL (ref 4.0–10.5)
nRBC: 0 % (ref 0.0–0.2)

## 2020-01-02 LAB — BASIC METABOLIC PANEL
Anion gap: 10 (ref 5–15)
BUN: 31 mg/dL — ABNORMAL HIGH (ref 8–23)
CO2: 34 mmol/L — ABNORMAL HIGH (ref 22–32)
Calcium: 8.7 mg/dL — ABNORMAL LOW (ref 8.9–10.3)
Chloride: 87 mmol/L — ABNORMAL LOW (ref 98–111)
Creatinine, Ser: 2.74 mg/dL — ABNORMAL HIGH (ref 0.61–1.24)
GFR, Estimated: 25 mL/min — ABNORMAL LOW (ref >60)
Glucose, Bld: 207 mg/dL — ABNORMAL HIGH (ref 70–99)
Potassium: 3.6 mmol/L (ref 3.5–5.1)
Sodium: 131 mmol/L — ABNORMAL LOW (ref 135–145)

## 2020-01-02 LAB — GLUCOSE, CAPILLARY
Glucose-Capillary: 144 mg/dL — ABNORMAL HIGH (ref 70–99)
Glucose-Capillary: 136 mg/dL — ABNORMAL HIGH (ref 70–99)
Glucose-Capillary: 117 mg/dL — ABNORMAL HIGH (ref 70–99)
Glucose-Capillary: 113 mg/dL — ABNORMAL HIGH (ref 70–99)

## 2020-01-02 LAB — APTT
aPTT: 150 seconds — ABNORMAL HIGH (ref 24–36)
aPTT: 96 seconds — ABNORMAL HIGH (ref 24–36)

## 2020-01-02 LAB — COOXEMETRY PANEL
Carboxyhemoglobin: 2.2 % — ABNORMAL HIGH (ref 0.5–1.5)
Methemoglobin: 0.9 % (ref 0.0–1.5)
O2 Saturation: 61.1 %
Total hemoglobin: 9.9 g/dL — ABNORMAL LOW (ref 12.0–16.0)

## 2020-01-02 LAB — MRSA PCR SCREENING: MRSA by PCR: NEGATIVE

## 2020-01-02 LAB — HEPARIN LEVEL (UNFRACTIONATED)
Heparin Unfractionated: 0.96 IU/mL — ABNORMAL HIGH (ref 0.30–0.70)
Heparin Unfractionated: 0.88 IU/mL — ABNORMAL HIGH (ref 0.30–0.70)

## 2020-01-02 MED ORDER — POTASSIUM CHLORIDE CRYS ER 20 MEQ PO TBCR
20.0000 meq | EXTENDED_RELEASE_TABLET | Freq: Once | ORAL | Status: AC
  Administered 2020-01-02: 20 meq via ORAL
  Filled 2020-01-02: qty 1

## 2020-01-02 MED ORDER — FUROSEMIDE 10 MG/ML IJ SOLN
80.0000 mg | Freq: Two times a day (BID) | INTRAMUSCULAR | Status: AC
  Administered 2020-01-02 (×2): 80 mg via INTRAVENOUS
  Filled 2020-01-02 (×2): qty 8

## 2020-01-02 NOTE — Progress Notes (Signed)
[place] for heparin Indication: atrial fibrillation  Allergies  Allergen Reactions  . Benadryl [Diphenhydramine] Palpitations    Patient Measurements: Height: 5\' 7"  (170.2 cm) Weight: 77.3 kg (170 lb 6.7 oz) IBW/kg (Calculated) : 66.1 Heparin Dosing Weight: 78 kg  Vital Signs: Temp: 97.8 F (36.6 C) (11/13 0442) Temp Source: Axillary (11/13 0442) BP: 114/77 (11/13 0500) Pulse Rate: 43 (11/13 0500)  Labs: Recent Labs    12/30/19 0813 12/30/19 0857 12/30/19 1743 12/31/19 0334 12/31/19 1837 12/31/19 1837 01/01/20 0352 01/02/20 0351  HGB  --   --   --    < > 10.2*   < > 9.9* 9.4*  HCT  --   --   --    < > 32.5*  --  31.1* 29.8*  PLT  --   --   --    < > 305  --  273 239  APTT   < >  --  49*   < > 73*  --  71* 150*  LABPROT  --  17.4* 16.5*  --  15.1  --   --   --   INR  --  1.5* 1.4*  --  1.2  --   --   --   HEPARINUNFRC  --   --  1.60*   < > 1.34*  --  0.90* 0.96*  CREATININE  --   --   --    < > 2.84*  --  2.79* 2.74*   < > = values in this interval not displayed.    Estimated Creatinine Clearance: 24.5 mL/min (A) (by C-G formula based on SCr of 2.74 mg/dL (H)).   Assessment: 29 yoM admitted with ADHF and AFib now with worsening cardiogenic shock. Pt on apixaban for AFib (last dose 0900 11/9), now to transition to IV heparin in anticipation of invasive procedures.  Code stroke called 11/10, CT head negative for bleed. Heparin resumed per neuro (adjusted goals, no further boluses).  Heparin level 0.96 (may still be affected by apixaban but up from yesterday), PTT 150 sec (supratherapeutic) on gtt at 1550 units/hr. Spoke with RN and she paused heparin for 15 min then flushed but drew labs from same line as heparin running so likely some residual heparin affecting labs. RN will move heparin to peripheral line and redraw labs in a few hours from central line.  Goal of Therapy:  Heparin level 0.3-0.5 units/ml aPTT 66-85  seconds Monitor platelets by anticoagulation protocol: Yes   Plan: Continue heparin at 1550 units/h - move to peripheral site Recheck heparin level and PTT at [name], PharmD, BCPS Please see amion for complete clinical pharmacist phone list 01/02/2020

## 2020-01-02 NOTE — Progress Notes (Signed)
Patient ID: Glenn [name], male   DOB: [birth year], 69 y.o.   MRN: [id number]     Advanced Heart Failure Rounding Note  PCP-Cardiologist: Virl Axe, MD   Subjective:    PICC placed 11/6 with initial CO-OX 31% and started Milrinone 0.25. On 11/8 txf ICU due to Co-ox remaining 30-40% on milrinone & started NE.  11/10: Code Stroke for L facial droop w/ slurred speech, TEE DC-CV canceled.   Today he remains on dobutamine 3 with NE 9, co-ox 61%.  Creatinine stable 2.74.  I/Os positive, CVP 13 today.    Objective:   Weight Range: 73.3 kg Body mass index is 25.31 kg/m.   Vital Signs:   Temp:  [97.6 F (36.4 C)-98.2 F (36.8 C)] 97.8 F (36.6 C) (11/13 0442) Pulse Rate:  [43-146] 82 (11/13 0700) Resp:  [13-29] 17 (11/13 0700) BP: (98-175)/(57-141) 115/76 (11/13 0700) SpO2:  [90 %-100 %] 95 % (11/13 0700) Weight:  [73.3 kg] 73.3 kg (11/13 0500) Last BM Date: 12/31/19  Weight change: Filed Weights   12/30/19 0444 01/01/20 0500 01/02/20 0500  Weight: 77.6 kg 77.3 kg 73.3 kg    Intake/Output:   Intake/Output Summary (Last 24 hours) at 01/02/2020 0751 Last data filed at 01/02/2020 0600 Gross per 24 hour  Intake 3310.51 ml  Output 1500 ml  Net 1810.51 ml      Physical Exam   CVP 13 General: NAD, in chair.  Neck: JVP 12-14 cm, no thyromegaly or thyroid nodule.  Lungs: Clear to auscultation bilaterally with normal respiratory effort. CV: Nondisplaced PMI.  Heart mildly tachy, irregular S1/S2, no S3/S4, no murmur.  No peripheral edema.   Abdomen: Soft, nontender, no hepatosplenomegaly, no distention.  Skin: Intact without lesions or rashes.  Neurologic: Alert and oriented x 3.  Psych: Normal affect. Extremities: No clubbing or cyanosis.  HEENT: Normal.    Telemetry   Afib rates 90s-110s.  Personally reviewed.   Labs    CBC Recent Labs    12/31/19 1540 12/31/19 1540 12/31/19 1837 12/31/19 1837 01/01/20 0352 01/02/20 0351  WBC 7.5   < > 9.2   < > 9.1  6.4  NEUTROABS 5.3  --  6.7  --   --   --   HGB 9.5*   < > 10.2*   < > 9.9* 9.4*  HCT 30.0*   < > 32.5*   < > 31.1* 29.8*  MCV 89.8   < > 91.0   < > 89.1 89.8  PLT 305   < > 305   < > 273 239   < > = values in this interval not displayed.   Basic Metabolic Panel Recent Labs    12/31/19 1540 12/31/19 1540 12/31/19 1837 12/31/19 1837 01/01/20 0352 01/02/20 0351  NA 132*   < > 130*   < > 132* 131*  K 4.1   < > 4.9   < > 3.8 3.6  CL 95*   < > 90*   < > 89* 87*  CO2 28   < > 29   < > 29 34*  GLUCOSE 220*   < > 178*   < > 186* 207*  BUN 21   < > 24*   < > 24* 31*  CREATININE 2.47*   < > 2.84*   < > 2.79* 2.74*  CALCIUM 8.0*   < > 9.0   < > 9.0 8.7*  MG 1.8  --  1.9  --   --   --    < > =  values in this interval not displayed.   Liver Function Tests Recent Labs    12/31/19 1540  AST 42*  ALT 132*  ALKPHOS 88  BILITOT 1.3*  PROT 6.5  ALBUMIN 3.1*   No results for input(s): LIPASE, AMYLASE in the last 72 hours. Cardiac Enzymes No results for input(s): CKTOTAL, CKMB, CKMBINDEX, TROPONINI in the last 72 hours.  BNP: BNP (last 3 results) Recent Labs    06/01/19 1413 06/23/19 1527 12/25/19 1757  BNP 531.4* 613.2* 776.7*    ProBNP (last 3 results) No results for input(s): PROBNP in the last 8760 hours.   D-Dimer No results for input(s): DDIMER in the last 72 hours. Hemoglobin A1C No results for input(s): HGBA1C in the last 72 hours. Fasting Lipid Panel No results for input(s): CHOL, HDL, LDLCALC, TRIG, CHOLHDL, LDLDIRECT in the last 72 hours. Thyroid Function Tests No results for input(s): TSH, T4TOTAL, T3FREE, THYROIDAB in the last 72 hours.  Invalid input(s): FREET3  Other results:   Imaging    No results found.   Medications:     Scheduled Medications: . atorvastatin  40 mg Oral QPM  . Chlorhexidine Gluconate Cloth  6 each Topical Daily  . feeding supplement  1 Container Oral TID WC  . feeding supplement  237 mL Oral BID BM  . furosemide  80  mg Intravenous BID  . gabapentin  300 mg Oral Daily  . insulin aspart  0-9 Units Subcutaneous TID WC  . pantoprazole  40 mg Oral Daily  . potassium chloride  40 mEq Oral Daily  . sodium chloride flush  10-40 mL Intracatheter Q12H    Infusions: . sodium chloride 10 mL/hr at 12/31/19 1900  . amiodarone 30 mg/hr (01/02/20 0600)  . DOBUTamine 3 mcg/kg/min (01/02/20 0600)  . heparin 1,550 Units/hr (01/02/20 0600)  . norepinephrine (LEVOPHED) Adult infusion 9 mcg/min (01/02/20 0600)    PRN Medications: acetaminophen, calcium carbonate, ondansetron (ZOFRAN) IV, sodium chloride flush    Patient Profile  69 y.o.malewith a hx of chronic systolic HF 2/2 NICM s/pBSciICD, persistent atrial fibrillation, h/o stroke (Lg L MCA infarct w/ hemorrhagic conversionin 2018), HTN, HCV, tobacco abuse (smokes ~1/4 ppd),  and chronic cholecystitiswith achronic perc drain.   Admitted with ADHF. Started on milrinone.    Assessment/Plan   1. Acute on chronic systolic HF due to NICM -> cardiogenic shock - suspect AF cardiomyopathy - ICD placed Nov 2015 - [facility] 10/2018 showed normal cors, elevated filling pressures and low CI at 1.9 - TTE 11/2018 LVEF 20%, mild RV dysfunction - Admit 06/2019 with severe HF, required IABP and milrinone. TEE 5/21 EF 20-25% with moderate to severe RV dysfunction.  - Admitted 12/25/2019 with ADHF. Milrinone 0.25 mcg started 11/7 due to low output HF.  - Echo 12/28/19 EF 20-25% RV mildly decreased  - Moved to ICU on 11/8 due to persistently low co-ox on milrinone. - 12/29/19: Continued w/ persistently low co-ox despite milrinone 0.5 and NE which was stopped d/t elevated MAPs. Gave metolazone 2/2 low UOP. Transitioned to dobutamine gtt starting at 2.5 mcg then increased to 3 mcg.  - Suspect RV function worse than it looks on echo.  - Today: CVP 13, co-ox 61% - CVP goal around 10 with RV failure.  - Continue dobutamine at 3 - BP stable to day, wean down norepinephrine.  -  Lasix 80 mg IV bid today, back to torsemide likely tomorrow.  - Replete potassium and magnesium.  - No beta blockers due to low output  HF - No dig or ACE/ARB/ARNI with worsening renal function.  - Options limited - poor LVAD candidate with noncompliance and renal failure. He did not make one visit to HF or EP clinic after dramatic hospitalization for AF and cardiogenic shock in 5/21  2. Concerning for acute ischemic R MCA -11/10 around 14:00 developed new L facial droop, LUE weakness and slurred speech - Code Stroke called. -hx of large R MCA with hemorrhagic conversion in Sept 2020 & L MCA s/p thrombectomy M2 occlusion in 2018.  - A1c 4.6%, LDL 63, HDL 26, Cholesterol 103, Triglycerides 68 -Neuro recs appreciated - Unable to tolerate change in ICD settings for MRI and MRA, spoke with neurology recommended repeat CT which showed no acute changes but remote R MCA.  - Atrovastatin dose increased - Reduced dose frequency of gabapentin in the setting of new tremors and AKI  3.  Persistent Afib with RVR - In 5/21 had refractory AF requiring multiple DC-CVs, high-dose amio and ranexa. He reported intermittent compliance with amio and Eliquis. Out of eliquis prior to admission.  - Transitioned from eliquis to heparin gtt due to possible need for procedures on 11/9 and planned for TEE DC-CV was scheduled 11/10, however canceled due to new neuro symptoms. - Stopped ranexa on 11/9. - Continue with heparin gtt (aPTT 71) and amiodarone 30 - Atrial fibrillation seems to drive CHF/cardiomyopathy, think he will need attempt at [facility] next week.    4. Acute on CKD IIIb - Baseline Cr looks to be around 1.6 to 1.7 - Admission Cr 1.69 - Peak Cr 2.84 - Today creatinine stable at 2.74, good UOP - Likely ATN/cardiorenal - Continue to monitor BMET   5. Elevated troponin - 10/2018 cath clean coronaries - elevated trops due to afib with RVR, CHF  6. Chronic cholecystitis with perc drain -  Per primary  team. Perc drain now fell out -  Will follow up outpatient   7. Iron deficiency anemia - Iron sats low.  - Has received feraheme  8. Depression - Watch QT closely on amio.  - Stopped zoloft 11/10 d/t prolong QT.   - Ranexa stopped 11/9.    CRITICAL CARE Performed by: Loralie Champagne  Total critical care time: 35 minutes  Critical care time was exclusive of separately billable procedures and treating other patients.  Critical care was necessary to treat or prevent imminent or life-threatening deterioration.  Critical care was time spent personally by me (independent of midlevel providers or residents) on the following activities: development of treatment plan with patient and/or surrogate as well as nursing, discussions with consultants, evaluation of patient's response to treatment, examination of patient, obtaining history from patient or surrogate, ordering and performing treatments and interventions, ordering and review of laboratory studies, ordering and review of radiographic studies, pulse oximetry and re-evaluation of patient's condition.  Loralie Champagne, MD  7:51 AM

## 2020-01-02 NOTE — Progress Notes (Signed)
[place] for heparin Indication: atrial fibrillation  Allergies  Allergen Reactions  . Benadryl [Diphenhydramine] Palpitations    Patient Measurements: Height: 5\' 7"  (170.2 cm) Weight: 73.3 kg (161 lb 9.6 oz) IBW/kg (Calculated) : 66.1 Heparin Dosing Weight: 78 kg  Vital Signs: Temp: 97.8 F (36.6 C) (11/13 0442) Temp Source: Axillary (11/13 0442) BP: 115/76 (11/13 0700) Pulse Rate: 82 (11/13 0700)  Labs: Recent Labs    12/30/19 0813 12/30/19 0857 12/30/19 1743 12/31/19 0334 12/31/19 1837 12/31/19 1837 01/01/20 0352 01/02/20 0351  HGB  --   --   --    < > 10.2*   < > 9.9* 9.4*  HCT  --   --   --    < > 32.5*  --  31.1* 29.8*  PLT  --   --   --    < > 305  --  273 239  APTT   < >  --  49*   < > 73*  --  71* 150*  LABPROT  --  17.4* 16.5*  --  15.1  --   --   --   INR  --  1.5* 1.4*  --  1.2  --   --   --   HEPARINUNFRC  --   --  1.60*   < > 1.34*  --  0.90* 0.96*  CREATININE  --   --   --    < > 2.84*  --  2.79* 2.74*   < > = values in this interval not displayed.    Estimated Creatinine Clearance: 24.5 mL/min (A) (by C-G formula based on SCr of 2.74 mg/dL (H)).   Assessment: 18 yoM admitted with ADHF and AFib now with worsening cardiogenic shock. Pt on apixaban for AFib (last dose 0900 11/9), now to transition to IV heparin in anticipation of invasive procedures.  Code stroke called 11/10, CT head negative for bleed. Heparin resumed per neuro (adjusted goals, no further boluses).  Heparin level 0.88 (may still be affected by apixaban, PTT 96 sec above low goal on 1550 units/hr. No bleeding issues noted. CBC stable.   Goal of Therapy:  Heparin level 0.3-0.5 units/ml aPTT 66-85 seconds Monitor platelets by anticoagulation protocol: Yes   Plan: Reduce heparin to 1450 units/hr  Recheck heparin level and PTT again in am  Erin Hearing PharmD., BCPS Clinical Pharmacist 01/02/2020 7:53 AM

## 2020-01-03 DIAGNOSIS — R570 Cardiogenic shock: Secondary | ICD-10-CM | POA: Diagnosis not present

## 2020-01-03 DIAGNOSIS — I5043 Acute on chronic combined systolic (congestive) and diastolic (congestive) heart failure: Secondary | ICD-10-CM | POA: Diagnosis not present

## 2020-01-03 DIAGNOSIS — I4819 Other persistent atrial fibrillation: Secondary | ICD-10-CM | POA: Diagnosis not present

## 2020-01-03 LAB — CBC
HCT: 31.4 % — ABNORMAL LOW (ref 39.0–52.0)
Hemoglobin: 9.6 g/dL — ABNORMAL LOW (ref 13.0–17.0)
MCH: 27.9 pg (ref 26.0–34.0)
MCHC: 30.6 g/dL (ref 30.0–36.0)
MCV: 91.3 fL (ref 80.0–100.0)
Platelets: 234 10*3/uL (ref 150–400)
RBC: 3.44 MIL/uL — ABNORMAL LOW (ref 4.22–5.81)
RDW: 16.8 % — ABNORMAL HIGH (ref 11.5–15.5)
WBC: 6.1 10*3/uL (ref 4.0–10.5)
nRBC: 0 % (ref 0.0–0.2)

## 2020-01-03 LAB — BASIC METABOLIC PANEL
Anion gap: 14 (ref 5–15)
BUN: 32 mg/dL — ABNORMAL HIGH (ref 8–23)
CO2: 31 mmol/L (ref 22–32)
Calcium: 8.9 mg/dL (ref 8.9–10.3)
Chloride: 86 mmol/L — ABNORMAL LOW (ref 98–111)
Creatinine, Ser: 2.64 mg/dL — ABNORMAL HIGH (ref 0.61–1.24)
GFR, Estimated: 26 mL/min — ABNORMAL LOW (ref >60)
Glucose, Bld: 243 mg/dL — ABNORMAL HIGH (ref 70–99)
Potassium: 4 mmol/L (ref 3.5–5.1)
Sodium: 131 mmol/L — ABNORMAL LOW (ref 135–145)

## 2020-01-03 LAB — COOXEMETRY PANEL
Carboxyhemoglobin: 2.1 % — ABNORMAL HIGH (ref 0.5–1.5)
Methemoglobin: 0.7 % (ref 0.0–1.5)
O2 Saturation: 65.9 %
Total hemoglobin: 9.7 g/dL — ABNORMAL LOW (ref 12.0–16.0)

## 2020-01-03 LAB — GLUCOSE, CAPILLARY
Glucose-Capillary: 104 mg/dL — ABNORMAL HIGH (ref 70–99)
Glucose-Capillary: 107 mg/dL — ABNORMAL HIGH (ref 70–99)
Glucose-Capillary: 115 mg/dL — ABNORMAL HIGH (ref 70–99)
Glucose-Capillary: 112 mg/dL — ABNORMAL HIGH (ref 70–99)
Glucose-Capillary: 127 mg/dL — ABNORMAL HIGH (ref 70–99)

## 2020-01-03 LAB — HEPARIN LEVEL (UNFRACTIONATED): Heparin Unfractionated: 0.46 IU/mL (ref 0.30–0.70)

## 2020-01-03 LAB — APTT: aPTT: 63 seconds — ABNORMAL HIGH (ref 24–36)

## 2020-01-03 MED ORDER — FUROSEMIDE 10 MG/ML IJ SOLN
80.0000 mg | Freq: Two times a day (BID) | INTRAMUSCULAR | Status: AC
  Administered 2020-01-03 (×2): 80 mg via INTRAVENOUS
  Filled 2020-01-03 (×2): qty 8

## 2020-01-03 MED ORDER — ALPRAZOLAM 0.25 MG PO TABS
0.2500 mg | ORAL_TABLET | Freq: Once | ORAL | Status: AC
  Administered 2020-01-03: 0.25 mg via ORAL
  Filled 2020-01-03: qty 1

## 2020-01-03 NOTE — Plan of Care (Signed)
  Problem: Education: Goal: Knowledge of disease or condition will improve Outcome: Progressing Goal: Understanding of medication regimen will improve Outcome: Progressing   Problem: Activity: Goal: Ability to tolerate increased activity will improve Outcome: Progressing   Problem: Education: Goal: Ability to verbalize understanding of medication therapies will improve Outcome: Progressing   Problem: Education: Goal: Knowledge of General Education information will improve Description: Including pain rating scale, medication(s)/side effects and non-pharmacologic comfort measures Outcome: Progressing   Problem: Clinical Measurements: Goal: Ability to maintain clinical measurements within normal limits will improve Outcome: Progressing   Problem: Nutrition: Goal: Adequate nutrition will be maintained Outcome: Progressing   Problem: Skin Integrity: Goal: Risk for impaired skin integrity will decrease Outcome: Progressing

## 2020-01-03 NOTE — Progress Notes (Signed)
Patient ID: Glenn BRANTON, male   DOB: 1950-02-24, 69 y.o.   MRN: [id number]     Advanced Heart Failure Rounding Note  PCP-Cardiologist: Virl Axe, MD   Subjective:    PICC placed 11/6 with initial CO-OX 31% and started Milrinone 0.25. On 11/8 txf ICU due to Co-ox remaining 30-40% on milrinone & started NE.  11/10: Code Stroke for L facial droop w/ slurred speech, TEE DC-CV canceled.   Today he remains on dobutamine 3 with NE down to 4, co-ox 66%.  BP good.  Creatinine lower at 2.6.  I/Os negative, weight down 1 lb.  CVP remains 13-14.    Objective:   Weight Range: 73 kg Body mass index is 25.21 kg/m.   Vital Signs:   Temp:  [97.4 F (36.3 C)-98 F (36.7 C)] 97.9 F (36.6 C) (11/14 0324) Pulse Rate:  [45-117] 64 (11/14 0600) Resp:  [12-28] 16 (11/14 0600) BP: (93-146)/(56-112) 114/63 (11/14 0600) SpO2:  [94 %-100 %] 98 % (11/14 0600) Weight:  [73 kg] 73 kg (11/14 0500) Last BM Date: 12/31/19  Weight change: Filed Weights   01/01/20 0500 01/02/20 0500 01/03/20 0500  Weight: 77.3 kg 73.3 kg 73 kg    Intake/Output:   Intake/Output Summary (Last 24 hours) at 01/03/2020 0734 Last data filed at 01/03/2020 0600 Gross per 24 hour  Intake 1519.51 ml  Output 2200 ml  Net -680.49 ml      Physical Exam   CVP 13-14 General: NAD Neck: JVP 12-14 cm, no thyromegaly or thyroid nodule.  Lungs: Clear to auscultation bilaterally with normal respiratory effort. CV: Nondisplaced PMI.  Heart irregular S1/S2, no S3/S4, no murmur.  No peripheral edema.   Abdomen: Soft, nontender, no hepatosplenomegaly, no distention.  Skin: Intact without lesions or rashes.  Neurologic: Alert and oriented x 3.  Psych: Normal affect. Extremities: No clubbing or cyanosis.  HEENT: Normal.    Telemetry   Afib rates 90s.  Personally reviewed.   Labs    CBC Recent Labs    12/31/19 1540 12/31/19 1540 12/31/19 1837 01/01/20 0352 01/02/20 0351 01/03/20 0400  WBC 7.5   < > 9.2   < >  6.4 6.1  NEUTROABS 5.3  --  6.7  --   --   --   HGB 9.5*   < > 10.2*   < > 9.4* 9.6*  HCT 30.0*   < > 32.5*   < > 29.8* 31.4*  MCV 89.8   < > 91.0   < > 89.8 91.3  PLT 305   < > 305   < > 239 234   < > = values in this interval not displayed.   Basic Metabolic Panel Recent Labs    12/31/19 1540 12/31/19 1540 12/31/19 1837 01/01/20 0352 01/02/20 0351 01/03/20 0400  NA 132*   < > 130*   < > 131* 131*  K 4.1   < > 4.9   < > 3.6 4.0  CL 95*   < > 90*   < > 87* 86*  CO2 28   < > 29   < > 34* 31  GLUCOSE 220*   < > 178*   < > 207* 243*  BUN 21   < > 24*   < > 31* 32*  CREATININE 2.47*   < > 2.84*   < > 2.74* 2.64*  CALCIUM 8.0*   < > 9.0   < > 8.7* 8.9  MG 1.8  --  1.9  --   --   --    < > =  values in this interval not displayed.   Liver Function Tests Recent Labs    12/31/19 1540  AST 42*  ALT 132*  ALKPHOS 88  BILITOT 1.3*  PROT 6.5  ALBUMIN 3.1*   No results for input(s): LIPASE, AMYLASE in the last 72 hours. Cardiac Enzymes No results for input(s): CKTOTAL, CKMB, CKMBINDEX, TROPONINI in the last 72 hours.  BNP: BNP (last 3 results) Recent Labs    06/01/19 1413 06/23/19 1527 12/25/19 1757  BNP 531.4* 613.2* 776.7*    ProBNP (last 3 results) No results for input(s): PROBNP in the last 8760 hours.   D-Dimer No results for input(s): DDIMER in the last 72 hours. Hemoglobin A1C No results for input(s): HGBA1C in the last 72 hours. Fasting Lipid Panel No results for input(s): CHOL, HDL, LDLCALC, TRIG, CHOLHDL, LDLDIRECT in the last 72 hours. Thyroid Function Tests No results for input(s): TSH, T4TOTAL, T3FREE, THYROIDAB in the last 72 hours.  Invalid input(s): FREET3  Other results:   Imaging    No results found.   Medications:     Scheduled Medications: . atorvastatin  40 mg Oral QPM  . Chlorhexidine Gluconate Cloth  6 each Topical Daily  . feeding supplement  1 Container Oral TID WC  . feeding supplement  237 mL Oral BID BM  . furosemide   80 mg Intravenous BID  . gabapentin  300 mg Oral Daily  . insulin aspart  0-9 Units Subcutaneous TID WC  . pantoprazole  40 mg Oral Daily  . potassium chloride  40 mEq Oral Daily  . sodium chloride flush  10-40 mL Intracatheter Q12H    Infusions: . sodium chloride 10 mL/hr at 12/31/19 1900  . amiodarone 30 mg/hr (01/03/20 0600)  . DOBUTamine 3 mcg/kg/min (01/03/20 0600)  . heparin 1,450 Units/hr (01/03/20 0600)  . norepinephrine (LEVOPHED) Adult infusion 4 mcg/min (01/03/20 0600)    PRN Medications: acetaminophen, calcium carbonate, ondansetron (ZOFRAN) IV, sodium chloride flush    Patient Profile  69 y.o.malewith a hx of chronic systolic HF 2/2 NICM s/pBSciICD, persistent atrial fibrillation, h/o stroke (Lg L MCA infarct w/ hemorrhagic conversionin 2018), HTN, HCV, tobacco abuse (smokes ~1/4 ppd),  and chronic cholecystitiswith achronic perc drain.   Admitted with ADHF. Started on milrinone.    Assessment/Plan   1. Acute on chronic systolic HF due to NICM -> cardiogenic shock - suspect AF cardiomyopathy - ICD placed Nov 2015 - [facility] 10/2018 showed normal cors, elevated filling pressures and low CI at 1.9 - TTE 11/2018 LVEF 20%, mild RV dysfunction - Admit 06/2019 with severe HF, required IABP and milrinone. TEE 5/21 EF 20-25% with moderate to severe RV dysfunction.  - Admitted 12/25/2019 with ADHF. Milrinone 0.25 mcg started 11/7 due to low output HF.  - Echo 12/28/19 EF 20-25% RV mildly decreased  - Moved to ICU on 11/8 due to persistently low co-ox on milrinone. - 12/29/19: Continued w/ persistently low co-ox despite milrinone 0.5 and NE which was stopped d/t elevated MAPs. Gave metolazone 2/2 low UOP. Transitioned to dobutamine gtt starting at 2.5 mcg then increased to 3 mcg.  - Suspect RV function worse than it looks on echo.  - Today: CVP 13-14, co-ox 66% - CVP goal around 10 with RV failure.  - Continue dobutamine at 3 - BP stable today, try to wean NE off, can  aim to keep SBP > 90.  - Lasix 80 mg IV bid again today (reasonable diuresis yesterday), back to torsemide likely tomorrow.  - No  beta blockers due to low output HF - No dig or ACE/ARB/ARNI with worsening renal function.  - Options limited - poor LVAD candidate with noncompliance and renal failure. He did not make one visit to HF or EP clinic after dramatic hospitalization for AF and cardiogenic shock in 5/21  2. Concerning for acute ischemic R MCA -11/10 around 14:00 developed new L facial droop, LUE weakness and slurred speech - Code Stroke called. -hx of large R MCA with hemorrhagic conversion in Sept 2020 & L MCA s/p thrombectomy M2 occlusion in 2018.  - A1c 4.6%, LDL 63, HDL 26, Cholesterol 103, Triglycerides 68 -Neuro recs appreciated - Unable to tolerate change in ICD settings for MRI and MRA, spoke with neurology recommended repeat CT which showed no acute changes but remote R MCA.  - Atrovastatin dose increased - Reduced dose frequency of gabapentin in the setting of new tremors and AKI  3.  Persistent Afib with RVR - In 5/21 had refractory AF requiring multiple DC-CVs, high-dose amio and ranexa. He reported intermittent compliance with amio and Eliquis. Out of eliquis prior to admission.  - Transitioned from eliquis to heparin gtt due to possible need for procedures on 11/9 and planned for TEE DC-CV was scheduled 11/10, however canceled due to new neuro symptoms. - Stopped ranexa on 11/9. - Continue with heparin gtt and amiodarone 30, rate is controlled. - Atrial fibrillation seems to drive CHF/cardiomyopathy, think he will need attempt at [facility] next week.    4. Acute on CKD IIIb - Baseline Cr looks to be around 1.6 to 1.7 - Admission Cr 1.69 - Peak Cr 2.84 - Today creatinine lower at 2.6, good UOP - Likely ATN/cardiorenal - Continue to monitor BMET   5. Elevated troponin - 10/2018 cath clean coronaries - elevated trops due to afib with RVR, CHF  6. Chronic  cholecystitis with perc drain -  Per primary team. Perc drain now fell out -  Will follow up outpatient   7. Iron deficiency anemia - Iron sats low.  - Has received feraheme  8. Depression - Watch QT closely on amio.  - Stopped zoloft 11/10 d/t prolong QT.   - Ranexa stopped 11/9.    CRITICAL CARE Performed by: Loralie Champagne  Total critical care time: 35 minutes  Critical care time was exclusive of separately billable procedures and treating other patients.  Critical care was necessary to treat or prevent imminent or life-threatening deterioration.  Critical care was time spent personally by me (independent of midlevel providers or residents) on the following activities: development of treatment plan with patient and/or surrogate as well as nursing, discussions with consultants, evaluation of patient's response to treatment, examination of patient, obtaining history from patient or surrogate, ordering and performing treatments and interventions, ordering and review of laboratory studies, ordering and review of radiographic studies, pulse oximetry and re-evaluation of patient's condition.  Loralie Champagne, MD  7:34 AM

## 2020-01-03 NOTE — Progress Notes (Signed)
[place] for heparin Indication: atrial fibrillation  Allergies  Allergen Reactions  . Benadryl [Diphenhydramine] Palpitations    Patient Measurements: Height: 5\' 7"  (170.2 cm) Weight: 73 kg (160 lb 15 oz) IBW/kg (Calculated) : 66.1 Heparin Dosing Weight: 78 kg  Vital Signs: Temp: 97.9 F (36.6 C) (11/14 0324) Temp Source: Oral (11/14 0324) BP: 114/63 (11/14 0600) Pulse Rate: 64 (11/14 0600)  Labs: Recent Labs    12/31/19 1837 12/31/19 1837 01/01/20 0352 01/01/20 0352 01/02/20 0351 01/02/20 1119 01/03/20 0400  HGB 10.2*   < > 9.9*   < > 9.4*  --  9.6*  HCT 32.5*   < > 31.1*  --  29.8*  --  31.4*  PLT 305   < > 273  --  239  --  234  APTT 73*   < > 71*   < > 150* 96* 63*  LABPROT 15.1  --   --   --   --   --   --   INR 1.2  --   --   --   --   --   --   HEPARINUNFRC 1.34*   < > 0.90*   < > 0.96* 0.88* 0.46  CREATININE 2.84*   < > 2.79*  --  2.74*  --  2.64*   < > = values in this interval not displayed.    Estimated Creatinine Clearance: 25.4 mL/min (A) (by C-G formula based on SCr of 2.64 mg/dL (H)).   Assessment: 3 yoM admitted with ADHF and AFib now with worsening cardiogenic shock. Pt on apixaban for AFib (last dose 0900 11/9), now to transition to IV heparin in anticipation of invasive procedures.  Code stroke called 11/10, CT head negative for bleed. Heparin resumed per neuro (adjusted goals, no further boluses).  Heparin level down to 0.46, very little residual effects of apixaban being seen. aPTT just below goal at 63 sec on 1450 units/hr. No bleeding issues noted. CBC stable.   Goal of Therapy:  Heparin level 0.3-0.5 units/ml aPTT 66-85 seconds Monitor platelets by anticoagulation protocol: Yes   Plan: Increase heparin to 1500 units/hr  Recheck heparin level and PTT again in am  Erin Hearing PharmD., BCPS Clinical Pharmacist 01/03/2020 7:24 AM

## 2020-01-03 NOTE — Progress Notes (Signed)
Spoke with Dr. Radford Pax regarding patient asking to have something to help him rest. Patient states he takes xanax at home, however valium is listed on his home med list. Atarax was discontinued in hospital on 12/31/19 which is around the time when CVA like symptoms were occurring. MD gave order for 0.25 mg xanax once.

## 2020-01-03 NOTE — Progress Notes (Signed)
Patient asked for something to help him sleep and stated that he was getting atarax since admission and it had helped. Paged Dr. Radford Pax to ask for something for patient to help him sleep.

## 2020-01-04 ENCOUNTER — Encounter (HOSPITAL_COMMUNITY): Payer: Self-pay | Admitting: Family Medicine

## 2020-01-04 DIAGNOSIS — I4819 Other persistent atrial fibrillation: Secondary | ICD-10-CM | POA: Diagnosis not present

## 2020-01-04 DIAGNOSIS — R570 Cardiogenic shock: Secondary | ICD-10-CM | POA: Diagnosis not present

## 2020-01-04 LAB — GLUCOSE, CAPILLARY
Glucose-Capillary: 93 mg/dL (ref 70–99)
Glucose-Capillary: 132 mg/dL — ABNORMAL HIGH (ref 70–99)
Glucose-Capillary: 150 mg/dL — ABNORMAL HIGH (ref 70–99)
Glucose-Capillary: 139 mg/dL — ABNORMAL HIGH (ref 70–99)

## 2020-01-04 LAB — BASIC METABOLIC PANEL
Anion gap: 12 (ref 5–15)
BUN: 34 mg/dL — ABNORMAL HIGH (ref 8–23)
CO2: 32 mmol/L (ref 22–32)
Calcium: 9.1 mg/dL (ref 8.9–10.3)
Chloride: 89 mmol/L — ABNORMAL LOW (ref 98–111)
Creatinine, Ser: 2.5 mg/dL — ABNORMAL HIGH (ref 0.61–1.24)
GFR, Estimated: 27 mL/min — ABNORMAL LOW (ref >60)
Glucose, Bld: 104 mg/dL — ABNORMAL HIGH (ref 70–99)
Potassium: 3.8 mmol/L (ref 3.5–5.1)
Sodium: 133 mmol/L — ABNORMAL LOW (ref 135–145)

## 2020-01-04 LAB — CBC
HCT: 30.8 % — ABNORMAL LOW (ref 39.0–52.0)
Hemoglobin: 9.5 g/dL — ABNORMAL LOW (ref 13.0–17.0)
MCH: 28.1 pg (ref 26.0–34.0)
MCHC: 30.8 g/dL (ref 30.0–36.0)
MCV: 91.1 fL (ref 80.0–100.0)
Platelets: 235 10*3/uL (ref 150–400)
RBC: 3.38 MIL/uL — ABNORMAL LOW (ref 4.22–5.81)
RDW: 17.2 % — ABNORMAL HIGH (ref 11.5–15.5)
WBC: 6.2 10*3/uL (ref 4.0–10.5)
nRBC: 0 % (ref 0.0–0.2)

## 2020-01-04 LAB — COOXEMETRY PANEL
Carboxyhemoglobin: 2.3 % — ABNORMAL HIGH (ref 0.5–1.5)
Methemoglobin: 0.8 % (ref 0.0–1.5)
O2 Saturation: 60.9 %
Total hemoglobin: 9.7 g/dL — ABNORMAL LOW (ref 12.0–16.0)

## 2020-01-04 LAB — APTT: aPTT: 77 seconds — ABNORMAL HIGH (ref 24–36)

## 2020-01-04 LAB — HEPARIN LEVEL (UNFRACTIONATED)
Heparin Unfractionated: 0.51 IU/mL (ref 0.30–0.70)
Heparin Unfractionated: 0.52 IU/mL (ref 0.30–0.70)

## 2020-01-04 MED ORDER — POTASSIUM CHLORIDE CRYS ER 20 MEQ PO TBCR
40.0000 meq | EXTENDED_RELEASE_TABLET | Freq: Two times a day (BID) | ORAL | Status: DC
  Administered 2020-01-04 – 2020-01-05 (×3): 40 meq via ORAL
  Filled 2020-01-04 (×5): qty 2

## 2020-01-04 MED ORDER — FUROSEMIDE 10 MG/ML IJ SOLN
80.0000 mg | Freq: Two times a day (BID) | INTRAMUSCULAR | Status: AC
  Administered 2020-01-04 (×2): 80 mg via INTRAVENOUS
  Filled 2020-01-04 (×2): qty 8

## 2020-01-04 MED ORDER — ALTEPLASE 2 MG IJ SOLR
2.0000 mg | Freq: Once | INTRAMUSCULAR | Status: AC
  Administered 2020-01-04: 2 mg

## 2020-01-04 MED ORDER — ALPRAZOLAM 0.25 MG PO TABS
0.2500 mg | ORAL_TABLET | Freq: Every evening | ORAL | Status: DC | PRN
  Administered 2020-01-04 – 2020-01-17 (×14): 0.25 mg via ORAL
  Filled 2020-01-04 (×14): qty 1

## 2020-01-04 MED ORDER — GABAPENTIN 300 MG PO CAPS
300.0000 mg | ORAL_CAPSULE | Freq: Two times a day (BID) | ORAL | Status: DC
  Administered 2020-01-04 – 2020-01-18 (×28): 300 mg via ORAL
  Filled 2020-01-04 (×28): qty 1

## 2020-01-04 NOTE — Progress Notes (Signed)
Patient ID: Glenn [name], male   DOB: [birth year], 69 y.o.   MRN: [id number]     Advanced Heart Failure Rounding Note  PCP-Cardiologist: Virl Axe, MD   Subjective:    PICC placed 11/6 with initial CO-OX 31% and started Milrinone 0.25. On 11/8 txf ICU due to Co-ox remaining 30-40% on milrinone & started NE.  11/10: Code Stroke for L facial droop w/ slurred speech, TEE DC-CV canceled.   Yesterday: Dobutamine 3, NE 4, co-ox 66%, CVP 13-14.  Given lasix 80 BID (11/13 & 11/14).   Today: Overnight difficulty sleeping was on atarax prior but d/c d/t neuro sx and prolong QTc.  Off NE.  Dobutamine 3.  Co-ox 61%.  CVP pending   Objective:   Weight Range: 72.3 kg Body mass index is 24.96 kg/m.   Vital Signs:   Temp:  [97.4 F (36.3 C)-98.8 F (37.1 C)] 97.4 F (36.3 C) (11/15 0645) Pulse Rate:  [44-154] 95 (11/15 0700) Resp:  [12-29] 14 (11/15 0700) BP: (70-142)/(48-90) 109/71 (11/15 0700) SpO2:  [93 %-100 %] 100 % (11/15 0700) Weight:  [72.3 kg] 72.3 kg (11/15 0444) Last BM Date: 01/03/20  Weight change: Filed Weights   01/02/20 0500 01/03/20 0500 01/04/20 0444  Weight: 73.3 kg 73 kg 72.3 kg    Intake/Output:   Intake/Output Summary (Last 24 hours) at 01/04/2020 0714 Last data filed at 01/04/2020 0700 Gross per 24 hour  Intake 1385.45 ml  Output 1815 ml  Net -429.55 ml      Physical Exam   CVP pending General:  Well appearing. No resp difficulty.   HEENT: Slight left facial droop and flattening of nasolabial fold.  Neck: supple.  Carotids 2+ bilat; no bruits. No lymphadenopathy or thryomegaly appreciated. Cor: PMI nondisplaced. Iregular rate & rhythm. No rubs, or gallops. + murmurs. Lungs: Fine rales noted to posterior left lower lobe.  Clear otherwise.  Abdomen: soft, nontender, nondistended. No hepatosplenomegaly. No bruits or masses. BS active in all 4 quadrants.  Extremities: no cyanosis, clubbing, rash, edema.  Neuro: alert & oriented x 3, moves all 4  extremities w/o difficulty. Affect pleasant   Telemetry   Afib w/ occasion PVC.  Rate 100-110s.  Personally reviewed.   Labs    CBC Recent Labs    01/03/20 0400 01/04/20 0400  WBC 6.1 6.2  HGB 9.6* 9.5*  HCT 31.4* 30.8*  MCV 91.3 91.1  PLT 234 588   Basic Metabolic Panel Recent Labs    01/03/20 0400 01/04/20 0400  NA 131* 133*  K 4.0 3.8  CL 86* 89*  CO2 31 32  GLUCOSE 243* 104*  BUN 32* 34*  CREATININE 2.64* 2.50*  CALCIUM 8.9 9.1   Liver Function Tests No results for input(s): AST, ALT, ALKPHOS, BILITOT, PROT, ALBUMIN in the last 72 hours. No results for input(s): LIPASE, AMYLASE in the last 72 hours. Cardiac Enzymes No results for input(s): CKTOTAL, CKMB, CKMBINDEX, TROPONINI in the last 72 hours.  BNP: BNP (last 3 results) Recent Labs    06/01/19 1413 06/23/19 1527 12/25/19 1757  BNP 531.4* 613.2* 776.7*    ProBNP (last 3 results) No results for input(s): PROBNP in the last 8760 hours.   D-Dimer No results for input(s): DDIMER in the last 72 hours. Hemoglobin A1C No results for input(s): HGBA1C in the last 72 hours. Fasting Lipid Panel No results for input(s): CHOL, HDL, LDLCALC, TRIG, CHOLHDL, LDLDIRECT in the last 72 hours. Thyroid Function Tests No results for input(s): TSH, T4TOTAL, T3FREE,  THYROIDAB in the last 72 hours.  Invalid input(s): FREET3  Other results:   Imaging    No results found.   Medications:     Scheduled Medications: . atorvastatin  40 mg Oral QPM  . Chlorhexidine Gluconate Cloth  6 each Topical Daily  . feeding supplement  1 Container Oral TID WC  . feeding supplement  237 mL Oral BID BM  . gabapentin  300 mg Oral Daily  . insulin aspart  0-9 Units Subcutaneous TID WC  . pantoprazole  40 mg Oral Daily  . potassium chloride  40 mEq Oral Daily  . sodium chloride flush  10-40 mL Intracatheter Q12H    Infusions: . sodium chloride 10 mL/hr at 12/31/19 1900  . amiodarone 30 mg/hr (01/04/20 0700)  .  DOBUTamine 3 mcg/kg/min (01/04/20 0700)  . heparin 1,500 Units/hr (01/04/20 0700)  . norepinephrine (LEVOPHED) Adult infusion Stopped (01/03/20 1615)    PRN Medications: acetaminophen, calcium carbonate, ondansetron (ZOFRAN) IV, sodium chloride flush    Patient Profile  69 y.o.malewith a hx of chronic systolic HF 2/2 NICM s/pBSciICD, persistent atrial fibrillation, h/o stroke (Lg L MCA infarct w/ hemorrhagic conversionin 2018), HTN, HCV, tobacco abuse (smokes ~1/4 ppd),  and chronic cholecystitiswith achronic perc drain.   Admitted with ADHF. Started on milrinone.    Assessment/Plan   1. Acute on chronic systolic HF due to NICM -> cardiogenic shock - suspect AF cardiomyopathy - ICD placed Nov 2015 - [facility] 10/2018 showed normal cors, elevated filling pressures and low CI at 1.9 - TTE 11/2018 LVEF 20%, mild RV dysfunction - Admit 06/2019 with severe HF, required IABP and milrinone. TEE 5/21 EF 20-25% with moderate to severe RV dysfunction.  - Admitted 12/25/2019 with ADHF. Milrinone 0.25 mcg started 11/7 due to low output HF.  - Echo 12/28/19 EF 20-25% RV mildly decreased  - Moved to ICU on 11/8 due to persistently low co-ox on milrinone. - 12/29/19: Continued w/ persistently low co-ox despite milrinone 0.5 and NE which was stopped d/t elevated MAPs. Gave metolazone 2/2 low UOP. Transitioned to dobutamine gtt starting at 2.5 mcg then increased to 3 mcg.  - Suspect RV function worse than it looks on echo.  - Today: CVP pending, co-ox 61%, off NE - CVP goal around 10 with RV failure.  - Continue dobutamine at 3.  - Continue w lasix 80 mg IV bid today and consider torsemide tomorrow - No beta blockers due to low output HF - No dig or ACE/ARB/ARNI with worsening renal function.  - Options limited - poor LVAD candidate with noncompliance and renal failure. He did not make one visit to HF or EP clinic after dramatic hospitalization for AF and cardiogenic shock in 5/21  2. Concerning  for acute ischemic R MCA -11/10 around 14:00 developed new L facial droop, LUE weakness and slurred speech - Code Stroke called. -hx of large R MCA with hemorrhagic conversion in Sept 2020 & L MCA s/p thrombectomy M2 occlusion in 2018.  - A1c 4.6%, LDL 63, HDL 26, Cholesterol 103, Triglycerides 68 -Neuro recs appreciated, did not tolerate change in ICD settings for MRI/MRA -Repeat CT on 11/12 no acute findings, prior remote R MCA infarct - Atrovastatin dose increased from home dose.   3.  Persistent Afib with RVR - In 5/21 had refractory AF requiring multiple DC-CVs, high-dose amio and ranexa. He reported intermittent compliance with amio and Eliquis. Out of eliquis prior to admission.  - Transitioned from eliquis to heparin gtt due to  possible need for procedures on 11/9 and planned for TEE DC-CV was scheduled 11/10, however canceled due to new neuro symptoms. - Stopped ranexa on 11/9. - Continue with heparin gtt and amiodarone 30. - Atrial fibrillation seems to drive CHF/cardiomyopathy, attempt at [facility] tomorrow, made NPO at [place].    4. Acute on CKD IIIb - Baseline Cr looks to be around 1.6 to 1.7 - Admission Cr 1.69 - Peak Cr 2.84 - Today creatinine lower at 2.5, good UOP - Likely ATN/cardiorenal - Continue to monitor BMET   5. Elevated troponin - 10/2018 cath clean coronaries - elevated trops due to afib with RVR, CHF  6. Chronic cholecystitis with perc drain -  Per primary team. Perc drain now fell out -  Will follow up outpatient   7. Iron deficiency anemia - Iron sats low.  - Has received feraheme  8. Depression - Watch QT closely on amio.  - Stopped zoloft & atarax 11/10 d/t prolong QT.   - Ranexa stopped 11/9.    Arlyss Gandy, NP-C 9:38 AM   Agree with above.  Remains on dobutamine 3. NE off. Co-ox 61% CVP up on exam. PICC clotted. Feels ok. Remains in AF with RVR on IV amio and heparin  General:  Lying in bed No resp difficulty HEENT: normal Neck:  supple. JVP to jaw. Carotids 2+ bilat; no bruits. No lymphadenopathy or thryomegaly appreciated. Cor: PMI nondisplaced. Irregular rate & rhythm.+ s3 Lungs: clear Abdomen: soft, nontender, nondistended. No hepatosplenomegaly. No bruits or masses. Good bowel sounds. Extremities: no cyanosis, clubbing, rash, edema Neuro: alert & orientedx3, cranial nerves grossly intact. moves all 4 extremities w/o difficulty. Affect pleasant  He remains quite tenuous. Will continue dobutamine. Continue IV lasix today. Will plan bedside TEE & DC-CV tomorrow. Continue amio and heparin. Dosing discussed with PahrmD.   CRITICAL CARE Performed by: Glori Bickers  Total critical care time: 35 minutes  Critical care time was exclusive of separately billable procedures and treating other patients.  Critical care was necessary to treat or prevent imminent or life-threatening deterioration.  Critical care was time spent personally by me (independent of midlevel providers or residents) on the following activities: development of treatment plan with patient and/or surrogate as well as nursing, discussions with consultants, evaluation of patient's response to treatment, examination of patient, obtaining history from patient or surrogate, ordering and performing treatments and interventions, ordering and review of laboratory studies, ordering and review of radiographic studies, pulse oximetry and re-evaluation of patient's condition.  Glori Bickers, MD  9:41 AM

## 2020-01-04 NOTE — Progress Notes (Signed)
Addendum to progress note: CVP currently 11-12.

## 2020-01-04 NOTE — Progress Notes (Signed)
Physical Therapy Treatment Patient Details Name: Glenn [name] MRN: [id number] DOB: 03/16/1950 Today's Date: 01/04/2020    History of Present Illness Glenn [name] is a 69 y.o. male with a hx of chronic systolic HF 2/2 NICM s/p BSci ICD, atrial fibrillation, h/o stroke (Lg L MCA infarct w/ hemorrhagic conversion in 2018), h/o DVT, chornic a/c therapy, HTN, HLD, pulmonary nodule, Hepatitis C, carpal tunnel,  tobacco abuse (smokes ~1/4 ppd) and chronic cholecystitis with a chronic perc drain, who is being seen today for the evaluation of Afib RVR and CHF at the request of Dr Regenia Skeeter. Pt experienced onset of L side weakness on 12/30/19, CT negative for acute changes, pt cannot have an MRI.    PT Comments    Pt admitted with above diagnosis. Pt was able to ambulate with min guard assist to min assist for up to 180 feet with RW.  Cues for sequencing gait and RW.  Pt does report  left LE fatigue and some pain shooting down left LE as well.   Will continue to progress pt as able. Pt currently with functional limitations due to balance and endurance deficits. Pt will benefit from skilled PT to increase their independence and safety with mobility to allow discharge to the venue listed below.     Follow Up Recommendations  Supervision for mobility/OOB;Home health PT     Equipment Recommendations  Rolling walker with 5" wheels    Recommendations for Other Services       Precautions / Restrictions Precautions Precautions: Fall Precaution Comments: watch HR Restrictions Weight Bearing Restrictions: No    Mobility  Bed Mobility Overal bed mobility: Independent                Transfers Overall transfer level: Needs assistance Equipment used: Rolling walker (2 wheeled) Transfers: Sit to/from Stand Sit to Stand: Min guard         General transfer comment: min guard for safety and lines  Ambulation/Gait Ambulation/Gait assistance: Min assist;Min guard Gait Distance  (Feet): 180 Feet Assistive device: Rolling walker (2 wheeled) Gait Pattern/deviations: Narrow base of support;Decreased weight shift to left;Decreased step length - right;Decreased dorsiflexion - left;Drifts right/left Gait velocity: decreased Gait velocity interpretation: 1.31 - 2.62 ft/sec, indicative of limited community ambulator General Gait Details: min A for steadying, cues to widen BOS, requiring vc for increased L knee flexion, vc for staying in middle of RW and to steer RW   Stairs             Wheelchair Mobility    Modified Rankin (Stroke Patients Only)       Balance Overall balance assessment: Needs assistance Sitting-balance support: No upper extremity supported;Feet supported Sitting balance-Leahy Scale: Good Sitting balance - Comments: Pt able to sit EOB without UE support   Standing balance support: Bilateral upper extremity supported;During functional activity Standing balance-Leahy Scale: Poor Standing balance comment: reliant on RW for dynamic balance                            Cognition Arousal/Alertness: Awake/alert Behavior During Therapy: WFL for tasks assessed/performed Overall Cognitive Status: Within Functional Limits for tasks assessed                                 General Comments: A/O x4      Exercises General Exercises - Lower Extremity Ankle Circles/Pumps: AROM;Both;20 reps;Supine Long Arc  Quad: AROM;Both;10 reps;Seated Hip Flexion/Marching: AROM;Both;10 reps;Seated    General Comments General comments (skin integrity, edema, etc.): VSS during ambulation      Pertinent Vitals/Pain Pain Assessment: Faces Faces Pain Scale: Hurts even more Pain Location: left knee to foot with ambulation Pain Descriptors / Indicators: Aching;Grimacing;Guarding Pain Intervention(s): Limited activity within patient's tolerance;Monitored during session;Repositioned    Home Living                      Prior  Function            PT Goals (current goals can now be found in the care plan section) Acute Rehab PT Goals Patient Stated Goal: return home Progress towards PT goals: Progressing toward goals    Frequency    Min 3X/week      PT Plan Current plan remains appropriate    Co-evaluation              AM-PAC PT "6 Clicks" Mobility   Outcome Measure  Help needed turning from your back to your side while in a flat bed without using bedrails?: None Help needed moving from lying on your back to sitting on the side of a flat bed without using bedrails?: None Help needed moving to and from a bed to a chair (including a wheelchair)?: None Help needed standing up from a chair using your arms (e.g., wheelchair or bedside chair)?: None Help needed to walk in hospital room?: A Little Help needed climbing 3-5 steps with a railing? : A Little 6 Click Score: 22    End of Session Equipment Utilized During Treatment: Gait belt Activity Tolerance: Patient tolerated treatment well Patient left: with call bell/phone within reach;in chair Nurse Communication: Mobility status PT Visit Diagnosis: Difficulty in walking, not elsewhere classified (R26.2)     Time: 8099-8338 PT Time Calculation (min) (ACUTE ONLY): 19 min  Charges:  $Gait Training: 8-22 mins                     Berlyn Saylor W,PT Acute Rehabilitation Services Pager:  [phone]  Office:  [place] 01/04/2020, 10:38 AM

## 2020-01-04 NOTE — Progress Notes (Signed)
Occupational Therapy Treatment Patient Details Name: Glenn [name] MRN: [id number] DOB: 03/29/1950 Today's Date: 01/04/2020    History of present illness Glenn [name] is a 69 y.o. male with a hx of chronic systolic HF 2/2 NICM s/p BSci ICD, atrial fibrillation, h/o stroke (Lg L MCA infarct w/ hemorrhagic conversion in 2018), h/o DVT, chornic a/c therapy, HTN, HLD, pulmonary nodule, Hepatitis C, carpal tunnel,  tobacco abuse (smokes ~1/4 ppd) and chronic cholecystitis with a chronic perc drain, who is being seen today for the evaluation of Afib RVR and CHF at the request of Dr Regenia Skeeter. Pt experienced onset of L side weakness on 12/30/19, CT negative for acute changes, pt cannot have an MRI.   OT comments  Focus of session on establishing exercise program for L shoulder and hand with level 2 theraband, med soft theraputty and squeeze ball.   Follow Up Recommendations  Home health OT;Supervision - Intermittent    Equipment Recommendations  None recommended by OT    Recommendations for Other Services      Precautions / Restrictions Precautions Precautions: Fall Precaution Comments: watch HR       Mobility Bed Mobility Overal bed mobility: Independent                Transfers                      Balance Overall balance assessment: Needs assistance   Sitting balance-Leahy Scale: Good                                     ADL either performed or assessed with clinical judgement   ADL                                               Vision       Perception     Praxis      Cognition Arousal/Alertness: Awake/alert Behavior During Therapy: WFL for tasks assessed/performed Overall Cognitive Status: Within Functional Limits for tasks assessed                                          Exercises Exercises: General Upper Extremity;Other exercises General Exercises - Upper Extremity Shoulder  Flexion: Strengthening;10 reps;Supine;Theraband;Left Theraband Level (Shoulder Flexion): Level 2 (Red) Shoulder Extension: Strengthening;Left;10 reps;Supine;Theraband Theraband Level (Shoulder Extension): Level 2 (Red) Shoulder Horizontal ABduction: Strengthening;Both;10 reps;Supine;Theraband Theraband Level (Shoulder Horizontal Abduction): Level 2 (Red) Other Exercises Other Exercises: med soft theraputty activities L Other Exercises: squeeze ball x 10 L    Shoulder Instructions       General Comments      Pertinent Vitals/ Pain       Pain Assessment: No/denies pain  Home Living                                          Prior Functioning/Environment              Frequency  Min 2X/week        Progress Toward Goals  OT Goals(current goals can now be found in  the care plan section)  Progress towards OT goals: Progressing toward goals  Acute Rehab OT Goals Patient Stated Goal: return home OT Goal Formulation: With patient Time For Goal Achievement: 01/15/20 Potential to Achieve Goals: Good  Plan Discharge plan remains appropriate    Co-evaluation                 AM-PAC OT "6 Clicks" Daily Activity     Outcome Measure   Help from another person eating meals?: None Help from another person taking care of personal grooming?: A Little Help from another person toileting, which includes using toliet, bedpan, or urinal?: A Little Help from another person bathing (including washing, rinsing, drying)?: A Little Help from another person to put on and taking off regular upper body clothing?: A Little Help from another person to put on and taking off regular lower body clothing?: A Little 6 Click Score: 19    End of Session    OT Visit Diagnosis: Other abnormalities of gait and mobility (R26.89);Unsteadiness on feet (R26.81);Muscle weakness (generalized) (M62.81);Hemiplegia and hemiparesis Hemiplegia - Right/Left: Left Hemiplegia -  dominant/non-dominant: Non-Dominant Hemiplegia - caused by: Cerebral infarction   Activity Tolerance Patient tolerated treatment well   Patient Left in bed;with call bell/phone within reach   Nurse Communication          Time: 8850-2774 OT Time Calculation (min): 15 min  Charges: OT General Charges $OT Visit: 1 Visit OT Treatments $Therapeutic Exercise: 8-22 mins  Nestor Lewandowsky, OTR/L Acute Rehabilitation Services Pager: [phone] Office: [phone]  Malka So 01/04/2020, 2:04 PM

## 2020-01-04 NOTE — Progress Notes (Addendum)
Restless overnight and did not sleep much. Xanax did not seem to help him rest. Blood pressure stable off levophed since 1615 yesterday afternoon.

## 2020-01-04 NOTE — Progress Notes (Addendum)
[place] for heparin Indication: atrial fibrillation  Allergies  Allergen Reactions  . Benadryl [Diphenhydramine] Palpitations    Patient Measurements: Height: 5\' 7"  (170.2 cm) Weight: 72.3 kg (159 lb 6.3 oz) IBW/kg (Calculated) : 66.1 Heparin Dosing Weight: 78 kg  Vital Signs: Temp: 97.4 F (36.3 C) (11/15 0645) Temp Source: Oral (11/15 0645) BP: 109/71 (11/15 0700) Pulse Rate: 95 (11/15 0700)  Labs: Recent Labs    01/02/20 0351 01/02/20 0351 01/02/20 1119 01/03/20 0400 01/04/20 0400  HGB 9.4*   < >  --  9.6* 9.5*  HCT 29.8*  --   --  31.4* 30.8*  PLT 239  --   --  234 235  APTT 150*   < > 96* 63* 77*  HEPARINUNFRC 0.96*   < > 0.88* 0.46 0.51  CREATININE 2.74*  --   --  2.64* 2.50*   < > = values in this interval not displayed.    Estimated Creatinine Clearance: 26.8 mL/min (A) (by C-G formula based on SCr of 2.5 mg/dL (H)).   Assessment: 24 yoM admitted with ADHF and AFib now with worsening cardiogenic shock. Pt on apixaban for AFib (last dose 0900 11/9), now to transition to IV heparin in anticipation of invasive procedures.  Code stroke called 11/10, CT head negative for bleed. Heparin resumed per neuro (adjusted goals, no further boluses).  Heparin level is slightly supratherapeutic at 0.51, aPTT therapeutic at 77 - correlating. Hgb 9.5, plt 235. No bleeding or infusion issues noted.   Goal of Therapy:  Heparin level 0.3-0.5 units/ml Monitor platelets by anticoagulation protocol: Yes   Plan: Will reduce heparin infusion to 1450 units/hr  Monitor daily HL, CBC, and for s/sx of bleeding  Antonietta Jewel, PharmD, [place] Pharmacist  Phone: [phone] 01/04/2020 7:38 AM  Please check AMION for all [place] phone numbers After 10:00 PM, call [place] [phone]  ADDENDUM  Heparin level came back supratherapeutic at 0.52, on 1450 units/hr. No s/sx of bleeding or infusion issues. Reduce heparin infusion to  1400 units/hr. Will check heparin level with AM labs.  Antonietta Jewel, PharmD, [place] Clinical Pharmacist

## 2020-01-05 ENCOUNTER — Inpatient Hospital Stay (HOSPITAL_COMMUNITY): Payer: Medicare HMO | Admitting: Certified Registered"

## 2020-01-05 ENCOUNTER — Encounter (HOSPITAL_COMMUNITY)
Admission: EM | Disposition: A | Discharge status: Home Health | Payer: Self-pay | Source: Home / Self Care | Attending: Internal Medicine

## 2020-01-05 ENCOUNTER — Encounter (HOSPITAL_COMMUNITY): Payer: Self-pay | Admitting: Internal Medicine

## 2020-01-05 DIAGNOSIS — R570 Cardiogenic shock: Secondary | ICD-10-CM | POA: Diagnosis not present

## 2020-01-05 DIAGNOSIS — I4819 Other persistent atrial fibrillation: Secondary | ICD-10-CM | POA: Diagnosis not present

## 2020-01-05 HISTORY — PX: CARDIOVERSION: SHX1299

## 2020-01-05 LAB — CULTURE, BLOOD (ROUTINE X 2)
Culture: NO GROWTH
Culture: NO GROWTH
Special Requests: ADEQUATE

## 2020-01-05 LAB — BASIC METABOLIC PANEL
Anion gap: 13 (ref 5–15)
BUN: 37 mg/dL — ABNORMAL HIGH (ref 8–23)
CO2: 31 mmol/L (ref 22–32)
Calcium: 9.1 mg/dL (ref 8.9–10.3)
Chloride: 86 mmol/L — ABNORMAL LOW (ref 98–111)
Creatinine, Ser: 2.62 mg/dL — ABNORMAL HIGH (ref 0.61–1.24)
GFR, Estimated: 26 mL/min — ABNORMAL LOW (ref >60)
Glucose, Bld: 194 mg/dL — ABNORMAL HIGH (ref 70–99)
Potassium: 4.3 mmol/L (ref 3.5–5.1)
Sodium: 130 mmol/L — ABNORMAL LOW (ref 135–145)

## 2020-01-05 LAB — MAGNESIUM: Magnesium: 2.3 mg/dL (ref 1.7–2.4)

## 2020-01-05 LAB — CBC
HCT: 29.6 % — ABNORMAL LOW (ref 39.0–52.0)
Hemoglobin: 9.1 g/dL — ABNORMAL LOW (ref 13.0–17.0)
MCH: 28 pg (ref 26.0–34.0)
MCHC: 30.7 g/dL (ref 30.0–36.0)
MCV: 91.1 fL (ref 80.0–100.0)
Platelets: 233 10*3/uL (ref 150–400)
RBC: 3.25 MIL/uL — ABNORMAL LOW (ref 4.22–5.81)
RDW: 17.3 % — ABNORMAL HIGH (ref 11.5–15.5)
WBC: 5.9 10*3/uL (ref 4.0–10.5)
nRBC: 0 % (ref 0.0–0.2)

## 2020-01-05 LAB — COOXEMETRY PANEL
Carboxyhemoglobin: 2.3 % — ABNORMAL HIGH (ref 0.5–1.5)
Methemoglobin: 0.8 % (ref 0.0–1.5)
O2 Saturation: 58.8 %
Total hemoglobin: 9.3 g/dL — ABNORMAL LOW (ref 12.0–16.0)

## 2020-01-05 LAB — GLUCOSE, CAPILLARY
Glucose-Capillary: 109 mg/dL — ABNORMAL HIGH (ref 70–99)
Glucose-Capillary: 97 mg/dL (ref 70–99)
Glucose-Capillary: 116 mg/dL — ABNORMAL HIGH (ref 70–99)
Glucose-Capillary: 113 mg/dL — ABNORMAL HIGH (ref 70–99)
Glucose-Capillary: 112 mg/dL — ABNORMAL HIGH (ref 70–99)

## 2020-01-05 LAB — HEPARIN LEVEL (UNFRACTIONATED): Heparin Unfractionated: 0.35 IU/mL (ref 0.30–0.70)

## 2020-01-05 SURGERY — CARDIOVERSION
Anesthesia: General

## 2020-01-05 MED ORDER — TORSEMIDE 20 MG PO TABS
40.0000 mg | ORAL_TABLET | Freq: Two times a day (BID) | ORAL | Status: DC
  Administered 2020-01-05 – 2020-01-06 (×4): 40 mg via ORAL
  Filled 2020-01-05 (×4): qty 2

## 2020-01-05 MED ORDER — ENSURE ENLIVE PO LIQD
237.0000 mL | Freq: Three times a day (TID) | ORAL | Status: DC
  Administered 2020-01-05 – 2020-01-18 (×36): 237 mL via ORAL

## 2020-01-05 MED ORDER — ETOMIDATE 2 MG/ML IV SOLN
INTRAVENOUS | Status: DC | PRN
  Administered 2020-01-05: 8 mg via INTRAVENOUS

## 2020-01-05 NOTE — Anesthesia Procedure Notes (Signed)
Procedure Name: General with mask airway Date/Time: 01/05/2020 8:10 AM Performed by: Amadeo Garnet, CRNA Pre-anesthesia Checklist: Patient identified, Emergency Drugs available, Suction available and Patient being monitored Patient Re-evaluated:Patient Re-evaluated prior to induction Oxygen Delivery Method: Ambu bag Preoxygenation: Pre-oxygenation with 100% oxygen Induction Type: IV induction Placement Confirmation: positive ETCO2 Dental Injury: Teeth and Oropharynx as per pre-operative assessment

## 2020-01-05 NOTE — Progress Notes (Signed)
SLP Cancellation Note  Patient Details Name: RONDEY FALLEN MRN: [id number] DOB: 1950/09/25   Cancelled treatment:       Reason Eval/Treat Not Completed: Patient at procedure or test/unavailable  Gabriel Rainwater MA, CCC-SLP   Timothea Bodenheimer Meryl 01/05/2020, 9:25 AM

## 2020-01-05 NOTE — Transfer of Care (Signed)
Immediate Anesthesia Transfer of Care Note  Patient: Glenn [name]  Procedure(s) Performed: BEDSIDE CARDIOVERSION (N/A )  Patient Location: SICU  Anesthesia Type:General  Level of Consciousness: drowsy  Airway & Oxygen Therapy: Patient Spontanous Breathing and Patient connected to nasal cannula oxygen  Post-op Assessment: Report given to RN, Post -op Vital signs reviewed and stable and Patient moving all extremities  Post vital signs: Reviewed and stable  Last Vitals:  Vitals Value Taken Time  BP 105/64 01/05/20 0830  Temp    Pulse 73 01/05/20 0830  Resp 16 01/05/20 0830  SpO2 100 % 01/05/20 0830  Vitals shown include unvalidated device data.  Last Pain:  Vitals:   01/05/20 0400  TempSrc:   PainSc: 0-No pain      Patients Stated Pain Goal: 0 (49/82/64 1583)  Complications: No complications documented.

## 2020-01-05 NOTE — CV Procedure (Signed)
° °   DIRECT CURRENT CARDIOVERSION  NAME:  Glenn [name]   MRN: [id number] DOB:  07-22-1950   ADMIT DATE: 12/25/2019   INDICATIONS: Atrial fibrillation    PROCEDURE:   Informed consent was obtained prior to the procedure. The risks, benefits and alternatives for the procedure were discussed and the patient comprehended these risks. Once an appropriate time out was taken, the patient had the defibrillator pads placed in the anterior and posterior position. The patient then underwent sedation by the anesthesia service. Once an appropriate level of sedation was achieved, the patient received a single biphasic, synchronized 200J shock with prompt conversion to sinus rhythm. No apparent complications.  Glori Bickers, MD  8:35 AM

## 2020-01-05 NOTE — Anesthesia Postprocedure Evaluation (Signed)
Anesthesia Post Note  Patient: Glenn [name]  Procedure(s) Performed: BEDSIDE CARDIOVERSION (N/A )     Patient location during evaluation: SICU Anesthesia Type: General Level of consciousness: sedated and patient cooperative Pain management: pain level controlled Vital Signs Assessment: post-procedure vital signs reviewed and stable Respiratory status: spontaneous breathing Cardiovascular status: stable Anesthetic complications: no   No complications documented.  Last Vitals:  Vitals:   01/05/20 1100 01/05/20 1200  BP: 124/69 122/66  Pulse: 81 80  Resp: (!) 22 16  Temp:  36.7 C  SpO2: 100% 98%    Last Pain:  Vitals:   01/05/20 1200  TempSrc: Oral  PainSc: 0-No pain                 [name]

## 2020-01-05 NOTE — Plan of Care (Signed)
  Problem: Education: Goal: Knowledge of disease or condition will improve Outcome: Progressing Goal: Understanding of medication regimen will improve Outcome: Progressing Goal: Individualized Educational Video(s) Outcome: Progressing   Problem: Activity: Goal: Ability to tolerate increased activity will improve Outcome: Progressing   Problem: Cardiac: Goal: Ability to achieve and maintain adequate cardiopulmonary perfusion will improve Outcome: Progressing   Problem: Health Behavior/Discharge Planning: Goal: Ability to safely manage health-related needs after discharge will improve Outcome: Progressing   Problem: Education: Goal: Ability to demonstrate management of disease process will improve Outcome: Progressing Goal: Ability to verbalize understanding of medication therapies will improve Outcome: Progressing Goal: Individualized Educational Video(s) Outcome: Progressing   Problem: Activity: Goal: Capacity to carry out activities will improve Outcome: Progressing   Problem: Cardiac: Goal: Ability to achieve and maintain adequate cardiopulmonary perfusion will improve Outcome: Progressing   Problem: Education: Goal: Knowledge of General Education information will improve Description: Including pain rating scale, medication(s)/side effects and non-pharmacologic comfort measures Outcome: Progressing   Problem: Health Behavior/Discharge Planning: Goal: Ability to manage health-related needs will improve Outcome: Progressing   Problem: Clinical Measurements: Goal: Ability to maintain clinical measurements within normal limits will improve Outcome: Progressing Goal: Will remain free from infection Outcome: Progressing Goal: Diagnostic test results will improve Outcome: Progressing Goal: Respiratory complications will improve Outcome: Progressing Goal: Cardiovascular complication will be avoided Outcome: Progressing   Problem: Activity: Goal: Risk for activity  intolerance will decrease Outcome: Progressing   Problem: Nutrition: Goal: Adequate nutrition will be maintained Outcome: Progressing   Problem: Coping: Goal: Level of anxiety will decrease Outcome: Progressing   Problem: Elimination: Goal: Will not experience complications related to bowel motility Outcome: Progressing Goal: Will not experience complications related to urinary retention Outcome: Progressing   Problem: Pain Managment: Goal: General experience of comfort will improve Outcome: Progressing   Problem: Safety: Goal: Ability to remain free from injury will improve Outcome: Progressing   Problem: Skin Integrity: Goal: Risk for impaired skin integrity will decrease Outcome: Progressing   

## 2020-01-05 NOTE — Anesthesia Preprocedure Evaluation (Addendum)
Anesthesia Evaluation  Patient identified by MRN, date of birth, ID band Patient awake    Reviewed: Allergy & Precautions, NPO status , Patient's Chart, lab work & pertinent test results  Airway Mallampati: II  TM Distance: >3 FB Neck ROM: Full    Dental  (+) Dental Advisory Given   Pulmonary neg pulmonary ROS, Patient abstained from smoking., former smoker,    Pulmonary exam normal breath sounds clear to auscultation       Cardiovascular hypertension, Pt. on medications +CHF   Rhythm:Irregular Rate:Tachycardia  Echo 12/28/2019 1. Left ventricular ejection fraction, by estimation, is 20 to 25%. The left ventricle has severely decreased function. The left ventricle demonstrates global hypokinesis. The left ventricular internal cavity size was severely dilated. There is mild left ventricular hypertrophy. Left ventricular diastolic parameters are consistent with Grade II diastolic dysfunction (pseudonormalization). Elevated left ventricular end-diastolic pressure.  2. Pacing wires in RA/RV. Right ventricular systolic function is normal. The right ventricular size is normal. There is severely elevated pulmonary artery systolic pressure.  3. Left atrial size was severely dilated.  4. The mitral valve is normal in structure. Mild mitral valve  regurgitation. No evidence of mitral stenosis.  5. Tricuspid valve regurgitation is severe.  6. The aortic valve is tricuspid. Aortic valve regurgitation is mild to moderate. Mild aortic valve sclerosis is present, with no evidence of aortic valve stenosis.  7. The inferior vena cava is dilated in size with >50% respiratory variability, suggesting right atrial pressure of 8 mmHg.    Neuro/Psych  Headaches, PSYCHIATRIC DISORDERS Anxiety CVA    GI/Hepatic negative GI ROS, (+) Hepatitis -, C  Endo/Other  negative endocrine ROS  Renal/GU Renal disease     Musculoskeletal negative  musculoskeletal ROS (+)   Abdominal   Peds  Hematology  (+) Blood dyscrasia, anemia ,   Anesthesia Other Findings   Reproductive/Obstetrics                            Anesthesia Physical Anesthesia Plan  ASA: IV  Anesthesia Plan: General   Post-op Pain Management:    Induction: Intravenous  PONV Risk Score and Plan: 2 and TIVA and Treatment may vary due to age or medical condition  Airway Management Planned: Mask  Additional Equipment: None  Intra-op Plan:   Post-operative Plan:   Informed Consent: I have reviewed the patients History and Physical, chart, labs and discussed the procedure including the risks, benefits and alternatives for the proposed anesthesia with the patient or authorized representative who has indicated his/her understanding and acceptance.     Dental advisory given  Plan Discussed with: CRNA  Anesthesia Plan Comments:        Anesthesia Quick Evaluation

## 2020-01-05 NOTE — Progress Notes (Signed)
[place] for heparin Indication: atrial fibrillation  Allergies  Allergen Reactions  . Benadryl [Diphenhydramine] Palpitations    Patient Measurements: Height: 5\' 7"  (170.2 cm) Weight: 72.3 kg (159 lb 6.3 oz) IBW/kg (Calculated) : 66.1 Heparin Dosing Weight: 78 kg  Vital Signs: Temp: 97.7 F (36.5 C) (11/16 0342) Temp Source: Oral (11/16 0342) BP: 113/65 (11/16 0600) Pulse Rate: 86 (11/16 0600)  Labs: Recent Labs    01/02/20 1119 01/02/20 1119 01/03/20 0400 01/03/20 0400 01/04/20 0400 01/04/20 1630 01/05/20 0242  HGB  --   --  9.6*   < > 9.5*  --  9.1*  HCT  --   --  31.4*  --  30.8*  --  29.6*  PLT  --   --  234  --  235  --  233  APTT 96*  --  63*  --  77*  --   --   HEPARINUNFRC 0.88*   < > 0.46   < > 0.51 0.52 0.35  CREATININE  --   --  2.64*  --  2.50*  --  2.62*   < > = values in this interval not displayed.    Estimated Creatinine Clearance: 25.6 mL/min (A) (by C-G formula based on SCr of 2.62 mg/dL (H)).   Assessment: 73 yoM admitted with ADHF and AFib now with worsening cardiogenic shock. Pt on apixaban for AFib (last dose 0900 11/9), now to transition to IV heparin in anticipation of invasive procedures.  Code stroke called 11/10, CT head negative for bleed. Heparin resumed per neuro (adjusted goals, no further boluses).  Heparin level is within goal range today 0.35.  Hgb low but stable, Pltc okay. No bleeding or infusion issues noted.   Goal of Therapy:  Heparin level 0.3-0.5 units/ml Monitor platelets by anticoagulation protocol: Yes   Plan: Continue IV heparin at current rate. Daily heparin level and CBC. F/u plans to resume oral anticoagulation eventually.  [place] Crane, Roylene Reason, BCCP Clinical Pharmacist  01/05/2020 7:30 AM   [facility] pharmacy phone numbers are listed on [email or web address]

## 2020-01-05 NOTE — Progress Notes (Addendum)
Patient ID: Glenn [name], male   DOB: [birth year], 69 y.o.   MRN: [id number]     Advanced Heart Failure Rounding Note  PCP-Cardiologist: Glenn Axe, MD   Subjective:    PICC placed 11/6 with initial CO-OX 31% and started Milrinone 0.25. On 11/8 txf ICU due to Co-ox remaining 30-40% on milrinone & started NE.  11/10: Code Stroke for L facial droop w/ slurred speech, TEE DC-CV canceled. Ultimately switch off milrinone and placed on DBA for persistently low co-ox.    Off NE. Remains on DBA 3 mcg/kg/min. Co-ox 59%. SBP 120s.  - 2.3L in UOP yesterday. SCr 2.6. CVP 9   Feels ok this morning. No complaints.    Objective:   Weight Range: 72.3 kg Body mass index is 24.96 kg/m.   Vital Signs:   Temp:  [97.3 F (36.3 C)-98.2 F (36.8 C)] 97.7 F (36.5 C) (11/16 0342) Pulse Rate:  [49-122] 86 (11/16 0600) Resp:  [14-28] 14 (11/16 0600) BP: (100-166)/(58-148) 113/65 (11/16 0600) SpO2:  [94 %-100 %] 96 % (11/16 0600) Last BM Date: 01/03/20  Weight change: Filed Weights   01/02/20 0500 01/03/20 0500 01/04/20 0444  Weight: 73.3 kg 73 kg 72.3 kg    Intake/Output:   Intake/Output Summary (Last 24 hours) at 01/05/2020 0714 Last data filed at 01/05/2020 0600 Gross per 24 hour  Intake 2106.77 ml  Output 2250 ml  Net -143.23 ml      Physical Exam   CVP 9  General:  Well appearing. No respiratory difficulty HEENT: normal Neck: supple. JVD ~10 cm. Carotids 2+ bilat; no bruits. No lymphadenopathy or thyromegaly appreciated. Cor: PMI nondisplaced. Irregularly irregular rhythm. No rubs, gallops or murmurs. Lungs: decreased BS at the bases bilaterally, no wheezing  Abdomen: soft, nontender, nondistended. No hepatosplenomegaly. No bruits or masses. Good bowel sounds.  Extremities: no cyanosis, clubbing, rash, edema Neuro: alert & oriented x 3, cranial nerves grossly intact. moves all 4 extremities w/o difficulty. Affect pleasant.   Telemetry   Afib 90s  Personally  reviewed.   Labs    CBC Recent Labs    01/04/20 0400 01/05/20 0242  WBC 6.2 5.9  HGB 9.5* 9.1*  HCT 30.8* 29.6*  MCV 91.1 91.1  PLT 235 790   Basic Metabolic Panel Recent Labs    01/04/20 0400 01/05/20 0242  NA 133* 130*  K 3.8 4.3  CL 89* 86*  CO2 32 31  GLUCOSE 104* 194*  BUN 34* 37*  CREATININE 2.50* 2.62*  CALCIUM 9.1 9.1  MG  --  2.3   Liver Function Tests No results for input(s): AST, ALT, ALKPHOS, BILITOT, PROT, ALBUMIN in the last 72 hours. No results for input(s): LIPASE, AMYLASE in the last 72 hours. Cardiac Enzymes No results for input(s): CKTOTAL, CKMB, CKMBINDEX, TROPONINI in the last 72 hours.  BNP: BNP (last 3 results) Recent Labs    06/01/19 1413 06/23/19 1527 12/25/19 1757  BNP 531.4* 613.2* 776.7*    ProBNP (last 3 results) No results for input(s): PROBNP in the last 8760 hours.   D-Dimer No results for input(s): DDIMER in the last 72 hours. Hemoglobin A1C No results for input(s): HGBA1C in the last 72 hours. Fasting Lipid Panel No results for input(s): CHOL, HDL, LDLCALC, TRIG, CHOLHDL, LDLDIRECT in the last 72 hours. Thyroid Function Tests No results for input(s): TSH, T4TOTAL, T3FREE, THYROIDAB in the last 72 hours.  Invalid input(s): FREET3  Other results:   Imaging    No results found.  Medications:     Scheduled Medications: . atorvastatin  40 mg Oral QPM  . Chlorhexidine Gluconate Cloth  6 each Topical Daily  . feeding supplement  1 Container Oral TID WC  . feeding supplement  237 mL Oral BID BM  . gabapentin  300 mg Oral BID  . insulin aspart  0-9 Units Subcutaneous TID WC  . pantoprazole  40 mg Oral Daily  . potassium chloride  40 mEq Oral BID  . sodium chloride flush  10-40 mL Intracatheter Q12H    Infusions: . sodium chloride 10 mL/hr at 01/05/20 0703  . amiodarone 30 mg/hr (01/05/20 0600)  . DOBUTamine 3 mcg/kg/min (01/05/20 0600)  . heparin 1,400 Units/hr (01/05/20 0600)  . norepinephrine  (LEVOPHED) Adult infusion Stopped (01/03/20 1615)    PRN Medications: acetaminophen, ALPRAZolam, calcium carbonate, ondansetron (ZOFRAN) IV, sodium chloride flush    Patient Profile  69 y.o.malewith a hx of chronic systolic HF 2/2 NICM s/pBSciICD, persistent atrial fibrillation, h/o stroke (Lg L MCA infarct w/ hemorrhagic conversionin 2018), HTN, HCV, tobacco abuse (smokes ~1/4 ppd),  and chronic cholecystitiswith achronic perc drain.   Admitted with ADHF. Started on milrinone>> later transitioned to DBA for persistently low co-ox.    Assessment/Plan   1. Acute on chronic systolic HF due to NICM -> cardiogenic shock - suspect AF cardiomyopathy - ICD placed Nov 2015 - [facility] 10/2018 showed normal cors, elevated filling pressures and low CI at 1.9 - TTE 11/2018 LVEF 20%, mild RV dysfunction - Admit 06/2019 with severe HF, required IABP and milrinone. TEE 5/21 EF 20-25% with moderate to severe RV dysfunction.  - Admitted 12/25/2019 with ADHF. Milrinone 0.25 mcg started 11/7 due to low output HF.  - Echo 12/28/19 EF 20-25% RV mildly decreased  - Moved to ICU on 11/8 due to persistently low co-ox on milrinone. - 12/29/19: Continued w/ persistently low co-ox despite milrinone 0.5 and NE which was stopped d/t elevated MAPs. Gave metolazone 2/2 low UOP. Transitioned to dobutamine gtt starting at 2.5 mcg then increased to 3 mcg.  - Suspect RV function worse than it looks on echo.  - Today: CVP 9, co-ox 58%, off NE - CVP goal around 10 with RV failure.  - Transition to PO diuretics, torsemide 40 mg bid  - Continue dobutamine at 3. Will try to wean today  - No beta blockers due to low output HF - No dig or ACE/ARB/ARNI with worsening renal function.  - Options limited - poor LVAD candidate with noncompliance and renal failure. He did not make one visit to HF or EP clinic after dramatic hospitalization for AF and cardiogenic shock in 5/21  2. Concerning for acute ischemic R MCA -11/10  around 14:00 developed new L facial droop, LUE weakness and slurred speech - Code Stroke called. -hx of large R MCA with hemorrhagic conversion in Sept 2020 & L MCA s/p thrombectomy M2 occlusion in 2018.  - A1c 4.6%, LDL 63, HDL 26, Cholesterol 103, Triglycerides 68 -Neuro recs appreciated, did not tolerate change in ICD settings for MRI/MRA -Repeat CT on 11/12 no acute findings, prior remote R MCA infarct - Atrovastatin dose increased from home dose.   3.  Persistent Afib with RVR - In 5/21 had refractory AF requiring multiple DC-CVs, high-dose amio and ranexa. He reported intermittent compliance with amio and Eliquis. Out of eliquis prior to admission.  - Transitioned from eliquis to heparin gtt due to possible need for procedures on 11/9 and planned for TEE DC-CV was scheduled  11/10, however canceled due to new neuro symptoms. - Stopped ranexa on 11/9. - Continue with heparin gtt and amiodarone 30. - Atrial fibrillation seems to drive CHF/cardiomyopathy, attempt at [name] today    4. Acute on CKD IIIb - Baseline Cr looks to be around 1.6 to 1.7 - Admission Cr 1.69 - Peak Cr 2.84 - Today creatinine lower at 2.6, good UOP - Likely ATN/cardiorenal - Continue to monitor BMET   5. Elevated troponin - 10/2018 cath clean coronaries - elevated trops due to afib with RVR, CHF  6. Chronic cholecystitis with perc drain -  Per primary team. Perc drain now fell out -  Will follow up outpatient   7. Iron deficiency anemia - Iron sats low.  - Has received feraheme - Hgb stable at 9.1  8. Depression - Watch QT closely on amio.  - Stopped zoloft & atarax 11/10 d/t prolong QT.   - Ranexa stopped 11/9.    Glenn Jester, PA-C  7:14 AM  Agree with above.   He remains on dobutamine and IV lasix. CVP down to 9. Co-ox marginal but stable off NE> creatinine remains elevated from baseline but stable. Remains in AF with RVR on IV amio and heparin. Feeling better today. No orthopnea or  PND.   General:  Lying in bed No resp difficulty HEENT: normal Neck: supple. JVP 9 Carotids 2+ bilat; no bruits. No lymphadenopathy or thryomegaly appreciated. Cor: PMI nondisplaced. Irregular rate & rhythm. +s3 Lungs: clear Abdomen: soft, nontender, nondistended. No hepatosplenomegaly. No bruits or masses. Good bowel sounds. Extremities: no cyanosis, clubbing, rash, edema Neuro: alert & orientedx3, cranial nerves grossly intact. moves all 4 extremities w/o difficulty. Affect pleasant  Continue dobutamine. Switch IV lasix to torsemide 60 bid. Plan bedside DC-CV today. Keep in ICU.   CRITICAL CARE Performed by: Glori Bickers  Total critical care time: 35 minutes  Critical care time was exclusive of separately billable procedures and treating other patients.  Critical care was necessary to treat or prevent imminent or life-threatening deterioration.  Critical care was time spent personally by me (independent of midlevel providers or residents) on the following activities: development of treatment plan with patient and/or surrogate as well as nursing, discussions with consultants, evaluation of patient's response to treatment, examination of patient, obtaining history from patient or surrogate, ordering and performing treatments and interventions, ordering and review of laboratory studies, ordering and review of radiographic studies, pulse oximetry and re-evaluation of patient's condition.  Glori Bickers, MD  8:03 AM

## 2020-01-06 DIAGNOSIS — I4819 Other persistent atrial fibrillation: Secondary | ICD-10-CM | POA: Diagnosis not present

## 2020-01-06 LAB — COOXEMETRY PANEL
Carboxyhemoglobin: 2.3 % — ABNORMAL HIGH (ref 0.5–1.5)
Methemoglobin: 0.5 % (ref 0.0–1.5)
O2 Saturation: 64.9 %
Total hemoglobin: 9.4 g/dL — ABNORMAL LOW (ref 12.0–16.0)

## 2020-01-06 LAB — GLUCOSE, CAPILLARY
Glucose-Capillary: 118 mg/dL — ABNORMAL HIGH (ref 70–99)
Glucose-Capillary: 109 mg/dL — ABNORMAL HIGH (ref 70–99)
Glucose-Capillary: 104 mg/dL — ABNORMAL HIGH (ref 70–99)
Glucose-Capillary: 113 mg/dL — ABNORMAL HIGH (ref 70–99)
Glucose-Capillary: 152 mg/dL — ABNORMAL HIGH (ref 70–99)

## 2020-01-06 LAB — BASIC METABOLIC PANEL
Anion gap: 11 (ref 5–15)
BUN: 42 mg/dL — ABNORMAL HIGH (ref 8–23)
CO2: 31 mmol/L (ref 22–32)
Calcium: 9.5 mg/dL (ref 8.9–10.3)
Chloride: 92 mmol/L — ABNORMAL LOW (ref 98–111)
Creatinine, Ser: 2.64 mg/dL — ABNORMAL HIGH (ref 0.61–1.24)
GFR, Estimated: 26 mL/min — ABNORMAL LOW (ref >60)
Glucose, Bld: 113 mg/dL — ABNORMAL HIGH (ref 70–99)
Potassium: 4.6 mmol/L (ref 3.5–5.1)
Sodium: 134 mmol/L — ABNORMAL LOW (ref 135–145)

## 2020-01-06 LAB — HEPARIN LEVEL (UNFRACTIONATED)
Heparin Unfractionated: 0.2 IU/mL — ABNORMAL LOW (ref 0.30–0.70)
Heparin Unfractionated: 0.4 IU/mL (ref 0.30–0.70)

## 2020-01-06 MED ORDER — MELATONIN 3 MG PO TABS
3.0000 mg | ORAL_TABLET | Freq: Every day | ORAL | Status: DC
  Administered 2020-01-06 – 2020-01-17 (×12): 3 mg via ORAL
  Filled 2020-01-06 (×12): qty 1

## 2020-01-06 MED ORDER — POLYETHYLENE GLYCOL 3350 17 G PO PACK: 17.0000 g | PACK | Freq: Every day | ORAL | Status: DC | PRN

## 2020-01-06 MED ORDER — DOCUSATE SODIUM 100 MG PO CAPS
100.0000 mg | ORAL_CAPSULE | Freq: Two times a day (BID) | ORAL | Status: DC
  Administered 2020-01-06 – 2020-01-15 (×19): 100 mg via ORAL
  Filled 2020-01-06 (×24): qty 1

## 2020-01-06 NOTE — Progress Notes (Signed)
Pt ambulated with min guard assist to bathroom with RW for toileting and standing grooming. Completed theraband and theraputty exercises with L UE. Decreased proprioception noted in L UE. Pt reports it has returned to baseline.    01/06/20 1449  OT Visit Information  Last OT Received On 01/06/20  Assistance Needed +1  History of Present Illness Glenn [name] is a 69 y.o. male with a hx of chronic systolic HF 2/2 NICM s/p BSci ICD, atrial fibrillation, h/o stroke (Lg L MCA infarct w/ hemorrhagic conversion in 2018), h/o DVT, chornic a/c therapy, HTN, HLD, pulmonary nodule, Hepatitis C, carpal tunnel,  tobacco abuse (smokes ~1/4 ppd) and chronic cholecystitis with a chronic perc drain, who is being seen today for the evaluation of Afib RVR and CHF at the request of Dr Regenia Skeeter. Pt experienced onset of L side weakness on 12/30/19, CT negative for acute changes, pt cannot have an MRI.  Precautions  Precautions Fall  Pain Assessment  Pain Assessment No/denies pain  Cognition  Arousal/Alertness Awake/alert  Behavior During Therapy WFL for tasks assessed/performed  Overall Cognitive Status Within Functional Limits for tasks assessed  ADL  Overall ADL's  Needs assistance/impaired  Grooming Set up;Sitting;Wash/dry hands  Toilet Transfer Min guard;RW;Ambulation  Toileting- Water quality scientist and Hygiene Min guard;Sit to/from stand  Functional mobility during ADLs Min guard;Rolling walker  Bed Mobility  Overal bed mobility Independent  Balance  Sitting balance-Leahy Scale Good  Standing balance-Leahy Scale Poor  Standing balance comment reliant on RW for dynamic balance  Transfers  Overall transfer level Needs assistance  Equipment used Rolling walker (2 wheeled)  Transfers Sit to/from Stand  Sit to Stand Supervision  General transfer comment for safety and lines  Exercises  Exercises General Upper Extremity  General Exercises - Upper Extremity  Shoulder Flexion Strengthening;10  reps;Supine;Theraband;Left  Shoulder Extension Strengthening;Left;10 reps;Supine;Theraband  Shoulder Horizontal ABduction Strengthening;Both;10 reps;Supine;Theraband  Elbow Flexion Strengthening;Left;10 reps;Supine  Elbow Extension Strengthening;Left;10 reps;Supine  Digit Composite Flexion Strengthening;Left;5 reps;Supine;Other (comment) (theraputty)  Composite Extension Strengthening;Left;5 reps;Supine;Other (comment) (theraputty)  Theraband Level (Shoulder Flexion) Level 2 (Red)  Theraband Level (Shoulder Extension) Level 2 (Red)  Theraband Level (Shoulder Horizontal Abduction) Level 2 (Red)  OT - End of Session  Activity Tolerance Patient tolerated treatment well  Patient left in bed;with call bell/phone within reach  OT Assessment/Plan  OT Plan Discharge plan remains appropriate  OT Visit Diagnosis Other abnormalities of gait and mobility (R26.89);Unsteadiness on feet (R26.81);Muscle weakness (generalized) (M62.81);Hemiplegia and hemiparesis  Hemiplegia - Right/Left Left  Hemiplegia - dominant/non-dominant Non-Dominant  Hemiplegia - caused by Cerebral infarction  OT Frequency (ACUTE ONLY) Min 2X/week  Follow Up Recommendations Home health OT;Supervision - Intermittent  OT Equipment None recommended by OT  AM-PAC OT "6 Clicks" Daily Activity Outcome Measure (Version 2)  Help from another person eating meals? 4  Help from another person taking care of personal grooming? 3  Help from another person toileting, which includes using toliet, bedpan, or urinal? 3  Help from another person bathing (including washing, rinsing, drying)? 3  Help from another person to put on and taking off regular upper body clothing? 3  Help from another person to put on and taking off regular lower body clothing? 3  6 Click Score 19  OT Goal Progression  Progress towards OT goals Progressing toward goals  Acute Rehab OT Goals  Patient Stated Goal return home  OT Goal Formulation With patient  Time For  Goal Achievement 01/15/20  Potential to Achieve Goals Good  OT  Time Calculation  OT Start Time (ACUTE ONLY) 1425  OT Stop Time (ACUTE ONLY) 1448  OT Time Calculation (min) 23 min  OT General Charges  $OT Visit 1 Visit  OT Treatments  $Self Care/Home Management  8-22 mins  $Therapeutic Exercise 8-22 mins  Nestor Lewandowsky, OTR/L Acute Rehabilitation Services Pager: [phone] Office: [phone]

## 2020-01-06 NOTE — Progress Notes (Signed)
[place] for heparin Indication: atrial fibrillation  Allergies  Allergen Reactions  . Benadryl [Diphenhydramine] Palpitations    Patient Measurements: Height: 5\' 7"  (170.2 cm) Weight: 72.2 kg (159 lb 2.8 oz) IBW/kg (Calculated) : 66.1 Heparin Dosing Weight: 78 kg  Vital Signs: Temp: 97.8 F (36.6 C) (11/17 1146) Temp Source: Oral (11/17 1146) BP: 117/63 (11/17 1315) Pulse Rate: 83 (11/17 1315)  Labs: Recent Labs    01/04/20 0400 01/04/20 1630 01/05/20 0242 01/06/20 0504 01/06/20 1332  HGB 9.5*  --  9.1*  --   --   HCT 30.8*  --  29.6*  --   --   PLT 235  --  233  --   --   APTT 77*  --   --   --   --   HEPARINUNFRC 0.51   < > 0.35 0.20* 0.40  CREATININE 2.50*  --  2.62* 2.64*  --    < > = values in this interval not displayed.    Estimated Creatinine Clearance: 25.4 mL/min (A) (by C-G formula based on SCr of 2.64 mg/dL (H)).   Assessment: 52 yoM admitted with ADHF and AFib now with worsening cardiogenic shock. Pt on apixaban for AFib (last dose 0900 11/9), now to transition to IV heparin in anticipation of invasive procedures.  Code stroke called 11/10, CT head negative for bleed. Heparin resumed per neuro (adjusted goals, no further boluses).  Heparin level is within goal range this afternoon at 0.4, on 1550 units/hr. Hgb low but stable, Pltc okay on last check. No bleeding or infusion issues noted.   Goal of Therapy:  Heparin level 0.3-0.5 units/ml Monitor platelets by anticoagulation protocol: Yes   Plan: Continue IV heparin at 1550 units/hr Daily heparin level and CBC. F/u plans to resume oral anticoagulation eventually.  Antonietta Jewel, PharmD, [place] Clinical Pharmacist  Phone: [phone] 01/06/2020 2:16 PM  Please check AMION for all [place] phone numbers After 10:00 PM, call [place] [phone]

## 2020-01-06 NOTE — Plan of Care (Signed)
  Problem: Education: Goal: Knowledge of disease or condition will improve Outcome: Progressing Goal: Understanding of medication regimen will improve Outcome: Progressing Goal: Individualized Educational Video(s) Outcome: Progressing   Problem: Activity: Goal: Ability to tolerate increased activity will improve Outcome: Progressing   Problem: Cardiac: Goal: Ability to achieve and maintain adequate cardiopulmonary perfusion will improve Outcome: Progressing   Problem: Health Behavior/Discharge Planning: Goal: Ability to safely manage health-related needs after discharge will improve Outcome: Progressing   Problem: Education: Goal: Ability to demonstrate management of disease process will improve Outcome: Progressing Goal: Ability to verbalize understanding of medication therapies will improve Outcome: Progressing Goal: Individualized Educational Video(s) Outcome: Progressing   Problem: Activity: Goal: Capacity to carry out activities will improve Outcome: Progressing   Problem: Cardiac: Goal: Ability to achieve and maintain adequate cardiopulmonary perfusion will improve Outcome: Progressing   Problem: Education: Goal: Knowledge of General Education information will improve Description: Including pain rating scale, medication(s)/side effects and non-pharmacologic comfort measures Outcome: Progressing   Problem: Health Behavior/Discharge Planning: Goal: Ability to manage health-related needs will improve Outcome: Progressing   Problem: Clinical Measurements: Goal: Ability to maintain clinical measurements within normal limits will improve Outcome: Progressing Goal: Will remain free from infection Outcome: Progressing Goal: Diagnostic test results will improve Outcome: Progressing Goal: Respiratory complications will improve Outcome: Progressing Goal: Cardiovascular complication will be avoided Outcome: Progressing   Problem: Activity: Goal: Risk for activity  intolerance will decrease Outcome: Progressing   Problem: Nutrition: Goal: Adequate nutrition will be maintained Outcome: Progressing   Problem: Coping: Goal: Level of anxiety will decrease Outcome: Progressing   Problem: Elimination: Goal: Will not experience complications related to bowel motility Outcome: Progressing Goal: Will not experience complications related to urinary retention Outcome: Progressing   Problem: Pain Managment: Goal: General experience of comfort will improve Outcome: Progressing   Problem: Safety: Goal: Ability to remain free from injury will improve Outcome: Progressing   Problem: Skin Integrity: Goal: Risk for impaired skin integrity will decrease Outcome: Progressing   

## 2020-01-06 NOTE — Progress Notes (Signed)
Physical Therapy Treatment Patient Details Name: Glenn [name] MRN: [id number] DOB: 1950/04/07 Today's Date: 01/06/2020    History of Present Illness Glenn [name] is a 69 y.o. male with a hx of chronic systolic HF 2/2 NICM s/p BSci ICD, atrial fibrillation, h/o stroke (Lg L MCA infarct w/ hemorrhagic conversion in 2018), h/o DVT, chornic a/c therapy, HTN, HLD, pulmonary nodule, Hepatitis C, carpal tunnel,  tobacco abuse (smokes ~1/4 ppd) and chronic cholecystitis with a chronic perc drain, who is being seen today for the evaluation of Afib RVR and CHF at the request of Dr Regenia Skeeter. Pt experienced onset of L side weakness on 12/30/19, CT negative for acute changes, pt cannot have an MRI.    PT Comments    Pt admitted with above diagnosis. Pt was able to ambulate with min guard to min assist in hallway increasing distance considerably. LEFT LE did show signs of fatigue with incr distance and needed to cue pt to pick up left LE toward end of walk.   Pt currently with functional limitations due to balance and endurance deficits. Pt will benefit from skilled PT to increase their independence and safety with mobility to allow discharge to the venue listed below.     Follow Up Recommendations  Home health PT;Supervision for mobility/OOB     Equipment Recommendations  Rolling walker with 5" wheels    Recommendations for Other Services       Precautions / Restrictions Precautions Precautions: Fall Precaution Comments: watch HR Restrictions Weight Bearing Restrictions: No    Mobility  Bed Mobility               General bed mobility comments: pt received in chair  Transfers Overall transfer level: Needs assistance Equipment used: Rolling walker (2 wheeled) Transfers: Sit to/from Stand Sit to Stand: Min guard         General transfer comment: min guard for safety and lines  Ambulation/Gait Ambulation/Gait assistance: Min assist;Min guard Gait Distance (Feet):  380 Feet Assistive device: Rolling walker (2 wheeled) Gait Pattern/deviations: Decreased weight shift to left;Decreased step length - right;Decreased dorsiflexion - left;Drifts right/left Gait velocity: decreased Gait velocity interpretation: 1.31 - 2.62 ft/sec, indicative of limited community ambulator General Gait Details: min guard overall  requiring vc for increased L knee flexion toward end of walk as pt was fatiguing, vc for staying in middle of RW and to steer RW   Stairs             Wheelchair Mobility    Modified Rankin (Stroke Patients Only)       Balance Overall balance assessment: Needs assistance Sitting-balance support: No upper extremity supported;Feet supported Sitting balance-Leahy Scale: Good Sitting balance - Comments: Pt able to sit EOB without UE support   Standing balance support: Bilateral upper extremity supported;During functional activity Standing balance-Leahy Scale: Poor Standing balance comment: reliant on RW for dynamic balance                            Cognition Arousal/Alertness: Awake/alert Behavior During Therapy: WFL for tasks assessed/performed Overall Cognitive Status: Within Functional Limits for tasks assessed                                 General Comments: A/O x4      Exercises General Exercises - Lower Extremity Ankle Circles/Pumps: AROM;Both;20 reps;Supine Long Arc Quad: AROM;Both;10 reps;Seated Hip Flexion/Marching:  AROM;Both;10 reps;Seated    General Comments General comments (skin integrity, edema, etc.): VSS during ambulation      Pertinent Vitals/Pain Pain Assessment: No/denies pain    Home Living                      Prior Function            PT Goals (current goals can now be found in the care plan section) Acute Rehab PT Goals Patient Stated Goal: return home Progress towards PT goals: Progressing toward goals    Frequency    Min 3X/week      PT Plan  Current plan remains appropriate    Co-evaluation              AM-PAC PT "6 Clicks" Mobility   Outcome Measure  Help needed turning from your back to your side while in a flat bed without using bedrails?: None Help needed moving from lying on your back to sitting on the side of a flat bed without using bedrails?: None Help needed moving to and from a bed to a chair (including a wheelchair)?: None Help needed standing up from a chair using your arms (e.g., wheelchair or bedside chair)?: None Help needed to walk in hospital room?: A Little Help needed climbing 3-5 steps with a railing? : A Little 6 Click Score: 22    End of Session Equipment Utilized During Treatment: Gait belt Activity Tolerance: Patient tolerated treatment well Patient left: with call bell/phone within reach;in chair Nurse Communication: Mobility status PT Visit Diagnosis: Difficulty in walking, not elsewhere classified (R26.2)     Time: 1145-1200 PT Time Calculation (min) (ACUTE ONLY): 15 min  Charges:  $Gait Training: 8-22 mins                     Otho Michalik W,PT Acute Rehabilitation Services Pager:  [phone]  Office:  [place] 01/06/2020, 2:16 PM

## 2020-01-06 NOTE — Progress Notes (Addendum)
Patient ID: Glenn [name], male   DOB: 02-Aug-1950, 69 y.o.   MRN: [id number]     Advanced Heart Failure Rounding Note  PCP-Cardiologist: Virl Axe, MD   Subjective:    PICC placed 11/6 with initial CO-OX 31% and started Milrinone 0.25. On 11/8 txf ICU due to Co-ox remaining 30-40% on milrinone & started NE.  11/10: Code Stroke for L facial droop w/ slurred speech. Ultimately switch off milrinone and placed on DBA for persistently low co-ox.  Remains off NE.   Yesterday: DCCV from Afib to  NSR.  Co-ox 59%, CVP 9.   Today: Denies chest pain and shortness of breath.  CVP 6.  Co-ox 65% Remains on DBA 3 mcg/kg/min and will titrate off today.  Remains in SR.    Objective:   Weight Range: 72.2 kg Body mass index is 24.93 kg/m.   Vital Signs:   Temp:  [97.4 F (36.3 C)-98.6 F (37 C)] 98.6 F (37 C) (11/17 0336) Pulse Rate:  [69-102] 71 (11/17 0700) Resp:  [8-48] 22 (11/17 0700) BP: (100-130)/(53-97) 104/58 (11/17 0700) SpO2:  [92 %-100 %] 96 % (11/17 0700) Weight:  [72.2 kg] 72.2 kg (11/17 0500) Last BM Date: 01/05/20  Weight change: Filed Weights   01/04/20 0444 01/05/20 0500 01/06/20 0500  Weight: 72.3 kg 74.4 kg 72.2 kg    Intake/Output:   Intake/Output Summary (Last 24 hours) at 01/06/2020 0718 Last data filed at 01/06/2020 0700 Gross per 24 hour  Intake 1089.86 ml  Output 1100 ml  Net -10.14 ml      Physical Exam   CVP 6 General:  Well appearing. No resp difficulty HEENT: Mild left facial droop, improving w/ flat L nasolabial fold.  Neck: supple. no JVD. Carotids 2+ bilat; no bruits. No lymphadenopathy or thryomegaly appreciated. Cor: PMI nondisplaced. Regular rate & rhythm. No rubs, or gallops. +murmurs. Lungs: clear bilaterally.  Abdomen: soft, nontender, nondistended. No hepatosplenomegaly. No bruits or masses. Good bowel sounds. Extremities: no cyanosis, clubbing, rash, edema Neuro: alert & oriented x 3, cranial nerves grossly intact. moves all 4  extremities w/o difficulty. Affect pleasant   Telemetry   1st degree AVB due to PR 0.20 on tele.  Personally reviewed.    Labs    CBC Recent Labs    01/04/20 0400 01/05/20 0242  WBC 6.2 5.9  HGB 9.5* 9.1*  HCT 30.8* 29.6*  MCV 91.1 91.1  PLT 235 235   Basic Metabolic Panel Recent Labs    01/05/20 0242 01/06/20 0504  NA 130* 134*  K 4.3 4.6  CL 86* 92*  CO2 31 31  GLUCOSE 194* 113*  BUN 37* 42*  CREATININE 2.62* 2.64*  CALCIUM 9.1 9.5  MG 2.3  --    Liver Function Tests No results for input(s): AST, ALT, ALKPHOS, BILITOT, PROT, ALBUMIN in the last 72 hours. No results for input(s): LIPASE, AMYLASE in the last 72 hours. Cardiac Enzymes No results for input(s): CKTOTAL, CKMB, CKMBINDEX, TROPONINI in the last 72 hours.  BNP: BNP (last 3 results) Recent Labs    06/01/19 1413 06/23/19 1527 12/25/19 1757  BNP 531.4* 613.2* 776.7*    ProBNP (last 3 results) No results for input(s): PROBNP in the last 8760 hours.   D-Dimer No results for input(s): DDIMER in the last 72 hours. Hemoglobin A1C No results for input(s): HGBA1C in the last 72 hours. Fasting Lipid Panel No results for input(s): CHOL, HDL, LDLCALC, TRIG, CHOLHDL, LDLDIRECT in the last 72 hours. Thyroid Function Tests  No results for input(s): TSH, T4TOTAL, T3FREE, THYROIDAB in the last 72 hours.  Invalid input(s): FREET3  Other results:   Imaging    No results found.   Medications:     Scheduled Medications: . atorvastatin  40 mg Oral QPM  . Chlorhexidine Gluconate Cloth  6 each Topical Daily  . feeding supplement  237 mL Oral TID BM  . gabapentin  300 mg Oral BID  . insulin aspart  0-9 Units Subcutaneous TID WC  . melatonin  3 mg Oral QHS  . pantoprazole  40 mg Oral Daily  . potassium chloride  40 mEq Oral BID  . sodium chloride flush  10-40 mL Intracatheter Q12H  . torsemide  40 mg Oral BID    Infusions: . amiodarone 30 mg/hr (01/06/20 0700)  . DOBUTamine 3 mcg/kg/min  (01/06/20 0700)  . heparin 1,550 Units/hr (01/06/20 0700)  . norepinephrine (LEVOPHED) Adult infusion Stopped (01/03/20 1615)    PRN Medications: acetaminophen, ALPRAZolam, calcium carbonate, ondansetron (ZOFRAN) IV, sodium chloride flush    Patient Profile  69 y.o.malewith a hx of chronic systolic HF 2/2 NICM s/pBSciICD, persistent atrial fibrillation, h/o stroke (Lg L MCA infarct w/ hemorrhagic conversionin 2018), HTN, HCV, tobacco abuse (smokes ~1/4 ppd),  and chronic cholecystitiswith achronic perc drain.   Admitted with ADHF. Started on milrinone>> later transitioned to DBA for persistently low co-ox.    Assessment/Plan   1. Acute on chronic systolic HF due to NICM -> cardiogenic shock - suspect AF cardiomyopathy - ICD placed Nov 2015 - [facility] 10/2018 showed normal cors, elevated filling pressures and low CI at 1.9 - TTE 11/2018 LVEF 20%, mild RV dysfunction - Admit 06/2019 with severe HF, required IABP and milrinone. TEE 5/21 EF 20-25% with moderate to severe RV dysfunction.  - Admitted 12/25/2019 with ADHF. Milrinone 0.25 mcg started 11/7 due to low output HF. Echo 12/28/19 EF 20-25% RV mildly decreased. Moved to ICU on 11/8 due to persistently low co-ox on milrinone. 12/29/19: Continued w/ persistently low co-ox despite milrinone 0.5 and NE which was stopped d/t elevated MAPs. Gave metolazone 2/2 low UOP. Transitioned to dobutamine gtt starting at 2.5 mcg then increased to 3 mcg.  - Remains off NE.  - Suspect RV function worse than it looks on echo.  - Today: CVP 6, co-ox 65%, -5 lbs and -1.1L, volume status stable - CVP goal around 10 with RV failure.  - Continue with torsemide 40 mg bid  - Wean dobutamine today  - No beta blockers due to low output HF - No dig or ACE/ARB/ARNI with worsening renal function.  - Options limited - poor LVAD candidate with noncompliance and renal failure. He did not make one visit to HF or EP clinic after dramatic hospitalization for AF and  cardiogenic shock in 5/21  2. Concerning for acute ischemic R MCA -11/10 around 14:00 developed new L facial droop, LUE weakness and slurred speech - Code Stroke called. -hx of large R MCA with hemorrhagic conversion in Sept 2020 & L MCA s/p thrombectomy M2 occlusion in 2018.  - A1c 4.6%, LDL 63, HDL 26, Cholesterol 103, Triglycerides 68 -Neuro recs appreciated, did not tolerate change in ICD settings for MRI/MRA -Repeat CT on 11/12 no acute findings, prior remote R MCA infarct - Atrovastatin dose increased from home dose.   3.  Persistent Afib with RVR - In 5/21 had refractory AF requiring multiple DC-CVs, high-dose amio and ranexa. He reported intermittent compliance with amio and Eliquis. Out of eliquis prior  to admission.  - Transitioned from eliquis to heparin gtt due to possible need for procedures on 11/9 and planned for TEE DC-CV was scheduled 11/10, however canceled due to new neuro symptoms. - Stopped ranexa on 11/9. - DCCV 11/17 converted to SR - Continue with heparin gtt and amiodarone 30.   4. Acute on CKD IIIb - Baseline Cr looks to be around 1.6 to 1.7 - Admission Cr 1.69 and Peak Cr 2.84 -Today creatinine stable at 2.6, good UOP - Likely ATN/cardiorenal - Continue to monitor BMET   5. Elevated troponin - 10/2018 cath clean coronaries - elevated trops due to afib with RVR, CHF  6. Chronic cholecystitis with perc drain -  Per primary team. Perc drain now fell out -  Will follow up outpatient   7. Iron deficiency anemia - Iron sats low.  - Has received feraheme - Hgb stable at 9.1 (11/16)  8. Depression - Watch QT closely on amio.  - Stopped zoloft & atarax 11/10 d/t prolong QT.   - Ranexa stopped 11/9.     Arlyss Gandy, NP 7:18 AM   Patient seen and examined with the above-signed Advanced Practice Provider and/or Housestaff. I personally reviewed laboratory data, imaging studies and relevant notes. I independently examined the patient and formulated the  important aspects of the plan. I have edited the note to reflect any of my changes or salient points. I have personally discussed the plan with the patient and/or family.  He remains in NSR after DC-CV yesterday. Feels ok. Co-ox improved. Remains on DBA 3. Creatinine stable. CVP 6  General:  Well appearing. No resp difficulty HEENT: normal Neck: supple. no JVD. Carotids 2+ bilat; no bruits. No lymphadenopathy or thryomegaly appreciated. Cor: PMI nondisplaced. Regular rate & rhythm. No rubs, gallops or murmurs. Lungs: clear Abdomen: soft, nontender, nondistended. No hepatosplenomegaly. No bruits or masses. Good bowel sounds. Extremities: no cyanosis, clubbing, rash, edema Neuro: alert & orientedx3, cranial nerves grossly intact. moves all 4 extremities w/o difficulty. Affect pleasant  Remains in NSR. Can start slow dobutamine wean to 2 today. Continue heparin one more day and switch to Eliquis tomorrow. Continue IV amio while on DBA. Can go to [facility].   Glori Bickers, MD  11:41 AM

## 2020-01-06 NOTE — Progress Notes (Signed)
ANTICOAGULATION CONSULT NOTE - Follow Up Consult  Pharmacy Consult for heparin Indication: atrial fibrillation  Labs: Recent Labs    01/04/20 0400 01/04/20 0400 01/04/20 1630 01/05/20 0242 01/06/20 0504  HGB 9.5*  --   --  9.1*  --   HCT 30.8*  --   --  29.6*  --   PLT 235  --   --  233  --   APTT 77*  --   --   --   --   HEPARINUNFRC 0.51   < > 0.52 0.35 0.20*  CREATININE 2.50*  --   --  2.62* 2.64*   < > = values in this interval not displayed.    Assessment: 69yo male subtherapeutic on heparin after one level at goal; no gtt issues or signs of bleeding per RN.  Goal of Therapy:  Heparin level 0.3-0.5 units/ml   Plan:  Will increase heparin gtt by 2 units/kg/hr to 1550 units/hr and check level in 8 hours.    Wynona Neat, PharmD, BCPS  01/06/2020,6:20 AM

## 2020-01-07 DIAGNOSIS — I4819 Other persistent atrial fibrillation: Secondary | ICD-10-CM | POA: Diagnosis not present

## 2020-01-07 LAB — COOXEMETRY PANEL
Carboxyhemoglobin: 2.5 % — ABNORMAL HIGH (ref 0.5–1.5)
Methemoglobin: 0.9 % (ref 0.0–1.5)
O2 Saturation: 67 %
Total hemoglobin: 9 g/dL — ABNORMAL LOW (ref 12.0–16.0)

## 2020-01-07 LAB — BASIC METABOLIC PANEL
Anion gap: 11 (ref 5–15)
BUN: 44 mg/dL — ABNORMAL HIGH (ref 8–23)
CO2: 31 mmol/L (ref 22–32)
Calcium: 9.4 mg/dL (ref 8.9–10.3)
Chloride: 92 mmol/L — ABNORMAL LOW (ref 98–111)
Creatinine, Ser: 2.93 mg/dL — ABNORMAL HIGH (ref 0.61–1.24)
GFR, Estimated: 23 mL/min — ABNORMAL LOW (ref >60)
Glucose, Bld: 107 mg/dL — ABNORMAL HIGH (ref 70–99)
Potassium: 4.4 mmol/L (ref 3.5–5.1)
Sodium: 134 mmol/L — ABNORMAL LOW (ref 135–145)

## 2020-01-07 LAB — GLUCOSE, CAPILLARY
Glucose-Capillary: 129 mg/dL — ABNORMAL HIGH (ref 70–99)
Glucose-Capillary: 112 mg/dL — ABNORMAL HIGH (ref 70–99)
Glucose-Capillary: 122 mg/dL — ABNORMAL HIGH (ref 70–99)
Glucose-Capillary: 102 mg/dL — ABNORMAL HIGH (ref 70–99)

## 2020-01-07 LAB — HEPARIN LEVEL (UNFRACTIONATED): Heparin Unfractionated: 0.52 IU/mL (ref 0.30–0.70)

## 2020-01-07 MED ORDER — TORSEMIDE 20 MG PO TABS
40.0000 mg | ORAL_TABLET | Freq: Every day | ORAL | Status: DC
  Administered 2020-01-07: 40 mg via ORAL
  Filled 2020-01-07: qty 2

## 2020-01-07 MED ORDER — APIXABAN 2.5 MG PO TABS: 2.5000 mg | ORAL_TABLET | Freq: Two times a day (BID) | ORAL | Status: DC

## 2020-01-07 MED ORDER — APIXABAN 5 MG PO TABS
5.0000 mg | ORAL_TABLET | Freq: Two times a day (BID) | ORAL | Status: DC
  Administered 2020-01-07 – 2020-01-18 (×22): 5 mg via ORAL
  Filled 2020-01-07 (×22): qty 1

## 2020-01-07 MED ORDER — HEPARIN (PORCINE) 25000 UT/250ML-% IV SOLN
1550.0000 [IU]/h | INTRAVENOUS | Status: AC
  Administered 2020-01-07: 1550 [IU]/h via INTRAVENOUS
  Filled 2020-01-07: qty 250

## 2020-01-07 NOTE — Progress Notes (Addendum)
10: 53 AM: New CVP set up.  CVP reading as 14-16.  Which is up from yesterday, CVP 7.  He is laying flat, sleeping in no acute distress.  Does have JVD on examination and diminished to left lower lobe with +1 lb since yesterday.  Initially, was going to hold off on diuretics due to increase BUN/Cr but may need to consider adding a lower dose of diuretic.    Addendum 16:52: Txf to 2C4.  CVP 14-15. Remains in SR on tele.  Denies chest pain or shortness of breath.  Only 475 mL UOP documented.  Added fluid restrictions and this PM changing off heparin gtt to PO eliquis.  Cautious w/ diuretic in setting of increase creatinine.

## 2020-01-07 NOTE — Plan of Care (Signed)
  Problem: Education: Goal: Knowledge of disease or condition will improve 01/07/2020 2122 by Roslyn Smiling, RN Outcome: Progressing 01/07/2020 2122 by Roslyn Smiling, RN Outcome: Progressing Goal: Understanding of medication regimen will improve 01/07/2020 2122 by Roslyn Smiling, RN Outcome: Progressing 01/07/2020 2122 by Roslyn Smiling, RN Outcome: Progressing Goal: Individualized Educational Video(s) Outcome: Progressing   Problem: Activity: Goal: Ability to tolerate increased activity will improve Outcome: Progressing   Problem: Cardiac: Goal: Ability to achieve and maintain adequate cardiopulmonary perfusion will improve 01/07/2020 2122 by Jumar Greenstreet, Kristopher Glee, RN Outcome: Progressing 01/07/2020 2122 by Roslyn Smiling, RN Outcome: Progressing   Problem: Health Behavior/Discharge Planning: Goal: Ability to safely manage health-related needs after discharge will improve Outcome: Progressing   Problem: Education: Goal: Ability to demonstrate management of disease process will improve Outcome: Progressing Goal: Ability to verbalize understanding of medication therapies will improve Outcome: Progressing Goal: Individualized Educational Video(s) Outcome: Progressing   Problem: Activity: Goal: Capacity to carry out activities will improve Outcome: Progressing   Problem: Cardiac: Goal: Ability to achieve and maintain adequate cardiopulmonary perfusion will improve Outcome: Progressing

## 2020-01-07 NOTE — Progress Notes (Addendum)
Patient ID: Glenn [name], male   DOB: [birth year], 69 y.o.   MRN: [id number]     Advanced Heart Failure Rounding Note  PCP-Cardiologist: Virl Axe, MD   Subjective:    PICC placed 11/6 with initial CO-OX 31% and started Milrinone 0.25. On 11/8 txf ICU due to Co-ox remaining 30-40% on milrinone & started NE.  11/10: Code Stroke for L facial droop w/ slurred speech. Ultimately switch off milrinone and placed on DBA for persistently low co-ox.  Off NE 11/11. DC-CV 11/6.   Yesterday: CVP 6, Co-ox 65%, DBA 19mcg/kg/min, torsemide 40 BID .    Today: CVP pending, Co-ox 67%, -2L denies chest pain and/or shortness of breath.      Objective:   Weight Range: 73.2 kg Body mass index is 25.28 kg/m.   Vital Signs:   Temp:  [96.8 F (36 C)-97.9 F (36.6 C)] 97.6 F (36.4 C) (11/18 0403) Pulse Rate:  [69-88] 76 (11/18 0600) Resp:  [13-45] 21 (11/18 0600) BP: (96-136)/(55-98) 96/58 (11/18 0600) SpO2:  [91 %-99 %] 94 % (11/18 0600) Weight:  [73.2 kg] 73.2 kg (11/18 0500) Last BM Date: 01/06/20  Weight change: Filed Weights   01/05/20 0500 01/06/20 0500 01/07/20 0500  Weight: 74.4 kg 72.2 kg 73.2 kg    Intake/Output:   Intake/Output Summary (Last 24 hours) at 01/07/2020 0655 Last data filed at 01/07/2020 0600 Gross per 24 hour  Intake 943.85 ml  Output 1985 ml  Net -1041.15 ml      Physical Exam   CVP pending General:  Well appearing. No resp difficulty HEENT: normal Neck: supple. + JVD. Carotids 2+ bilat; no bruits. No lymphadenopathy or thryomegaly appreciated. Cor: PMI nondisplaced. Regular rate & rhythm. No rubs, or gallops. + murmurs. Lungs: Diminished left lower base w/ very fine rales.  Abdomen: soft, nontender, nondistended. No hepatosplenomegaly. No bruits or masses. Good bowel sounds. Extremities: no cyanosis, clubbing, rash, edema Neuro: alert & oriented x 3, cranial nerves grossly intact. moves all 4 extremities w/o difficulty. Affect pleasant.  Very mild  left facial droop.    Telemetry   NSR 1st AVB, PR 0.21, rates 90s.  Personally reviewed.   Labs    CBC Recent Labs    01/05/20 0242  WBC 5.9  HGB 9.1*  HCT 29.6*  MCV 91.1  PLT 350   Basic Metabolic Panel Recent Labs    01/05/20 0242 01/05/20 0242 01/06/20 0504 01/07/20 0449  NA 130*   < > 134* 134*  K 4.3   < > 4.6 4.4  CL 86*   < > 92* 92*  CO2 31   < > 31 31  GLUCOSE 194*   < > 113* 107*  BUN 37*   < > 42* 44*  CREATININE 2.62*   < > 2.64* 2.93*  CALCIUM 9.1   < > 9.5 9.4  MG 2.3  --   --   --    < > = values in this interval not displayed.   Liver Function Tests No results for input(s): AST, ALT, ALKPHOS, BILITOT, PROT, ALBUMIN in the last 72 hours. No results for input(s): LIPASE, AMYLASE in the last 72 hours. Cardiac Enzymes No results for input(s): CKTOTAL, CKMB, CKMBINDEX, TROPONINI in the last 72 hours.  BNP: BNP (last 3 results) Recent Labs    06/01/19 1413 06/23/19 1527 12/25/19 1757  BNP 531.4* 613.2* 776.7*    ProBNP (last 3 results) No results for input(s): PROBNP in the last 8760 hours.  D-Dimer No results for input(s): DDIMER in the last 72 hours. Hemoglobin A1C No results for input(s): HGBA1C in the last 72 hours. Fasting Lipid Panel No results for input(s): CHOL, HDL, LDLCALC, TRIG, CHOLHDL, LDLDIRECT in the last 72 hours. Thyroid Function Tests No results for input(s): TSH, T4TOTAL, T3FREE, THYROIDAB in the last 72 hours.  Invalid input(s): FREET3  Other results:   Imaging    No results found.   Medications:     Scheduled Medications: . atorvastatin  40 mg Oral QPM  . Chlorhexidine Gluconate Cloth  6 each Topical Daily  . docusate sodium  100 mg Oral BID  . feeding supplement  237 mL Oral TID BM  . gabapentin  300 mg Oral BID  . insulin aspart  0-9 Units Subcutaneous TID WC  . melatonin  3 mg Oral QHS  . pantoprazole  40 mg Oral Daily  . sodium chloride flush  10-40 mL Intracatheter Q12H    Infusions: .  amiodarone 30 mg/hr (01/07/20 0600)  . DOBUTamine 2 mcg/kg/min (01/07/20 0600)  . heparin 1,550 Units/hr (01/07/20 0600)  . norepinephrine (LEVOPHED) Adult infusion Stopped (01/03/20 1615)    PRN Medications: acetaminophen, ALPRAZolam, calcium carbonate, ondansetron (ZOFRAN) IV, polyethylene glycol, sodium chloride flush    Patient Profile  69 y.o.malewith a hx of chronic systolic HF 2/2 NICM s/pBSciICD, persistent atrial fibrillation, h/o stroke (Lg L MCA infarct w/ hemorrhagic conversionin 2018), HTN, HCV, tobacco abuse (smokes ~1/4 ppd),  and chronic cholecystitiswith achronic perc drain.   Admitted with ADHF. Started on milrinone>> later transitioned to DBA for persistently low co-ox.    Assessment/Plan   1. Acute on chronic systolic HF due to NICM -> cardiogenic shock - suspect AF cardiomyopathy - ICD placed Nov 2015 - [facility] 10/2018 showed normal cors, elevated filling pressures and low CI at 1.9 - TTE 11/2018 LVEF 20%, mild RV dysfunction - Admit 06/2019 with severe HF, required IABP and milrinone. TEE 5/21 EF 20-25% with moderate to severe RV dysfunction.  - Admitted 12/25/2019 with ADHF. Milrinone 0.25 mcg started 11/7 due to low output HF. Echo 12/28/19 EF 20-25% RV mildly decreased. Moved to ICU on 11/8 due to persistently low co-ox on milrinone. 12/29/19: Continued w/ persistently low co-ox despite milrinone 0.5 and NE which was stopped d/t elevated MAPs. Gave metolazone 2/2 low UOP. Transitioned to dobutamine gtt starting at 2.5 mcg then increased to 3 mcg. Required NE but has been off since 11/11.  - Suspect RV function worse than it looks on echo.  - Today: CVP pending, co-ox 67%, positive JVD.   - CVP goal around 10 with RV failure.  - Hold torsemide 40 mg bid with increase in Cr and pending CVP.   - Wean dobutamine today to 1 mcg/kg/min - No beta blockers due to low output HF - No dig or ACE/ARB/ARNI with worsening renal function.  - Options limited - poor LVAD  candidate with noncompliance and renal failure. He did not make one visit to HF or EP clinic after dramatic hospitalization for AF and cardiogenic shock in 5/21  2. Concerning for acute ischemic R MCA -hx of large R MCA with hemorrhagic conversion in Sept 2020 & L MCA s/p thrombectomy M2 occlusion in 2018.  -11/10 around 14:00 developed new L facial droop, LUE weakness and slurred speech - Code Stroke called.  Neuro recs appreciated, did not tolerate change in ICD settings for MRI/MRA; Repeat CT on 11/12 no acute findings, prior remote R MCA infarct. - A1c 4.6%,  LDL 63, HDL 26, Cholesterol 103, Triglycerides 68 - Atrovastatin dose increased from home dose.   3.  Persistent Afib with RVR - In 5/21 had refractory AF requiring multiple DC-CVs, high-dose amio and ranexa.  -He reported intermittent compliance with amio and Eliquis. Out of eliquis prior to admission. Transitioned from eliquis to heparin gtt due to possible need for procedures on 11/9 and planned for TEE DC-CV was scheduled 11/10, however canceled due to new neuro symptoms. - Stopped ranexa on 11/9. - DCCV 11/17 converted to SR. - Continue with amiodarone 30 gtt while on DBA. - Will transition from heparin gtt to Eliquis 2.5 BID.   4. Acute on CKD IIIb - Baseline Cr looks to be around 1.6 to 1.7 - Admission Cr 1.69 and Peak Cr 2.84 -Today creatinine increased at 2.9, good UOP - Likely ATN/cardiorenal - Continue to monitor BMET   5. Elevated troponin - 10/2018 cath clean coronaries - Denies chest pain or shortness of breath - elevated trops due to afib with RVR, CHF   6. Chronic cholecystitis with perc drain -  Per primary team. Perc drain now fell out -  Will follow up outpatient   7. Iron deficiency anemia - Iron sats low and has received feraheme. - Hgb stable at 9.1 (11/16)  8. Depression - Watch QT closely on amio.  - Stopped zoloft & atarax 11/10 d/t prolong QT.   - Ranexa stopped 11/9.    Arlyss Gandy,  NP 6:55 AM   Patient seen and examined with the above-signed Advanced Practice Provider and/or Housestaff. I personally reviewed laboratory data, imaging studies and relevant notes. I independently examined the patient and formulated the important aspects of the plan. I have edited the note to reflect any of my changes or salient points. I have personally discussed the plan with the patient and/or family.  Remains on dobutamine at 2. Co-ox 67%. Creatinine worse today. CVP pending. Remains in NSR on IV amio. On IV heparin for AC. No bleeding. Denies SOB, orthopnea or PND.   General:  Lying in bed. No resp difficulty HEENT: normal Neck: supple. JVP 7-8. Carotids 2+ bilat; no bruits. No lymphadenopathy or thryomegaly appreciated. Cor: PMI nondisplaced. Regular rate & rhythm. No rubs, gallops or murmurs. Lungs: clear Abdomen: soft, nontender, nondistended. No hepatosplenomegaly. No bruits or masses. Good bowel sounds. Extremities: no cyanosis, clubbing, rash, edema Neuro: alert & orientedx3, cranial nerves grossly intact. moves all 4 extremities w/o difficulty. Affect pleasant  Agree with continuing to wean DBA to 1. Hold torsemide for now. Need to make sure MAP 70 or greater throughout the day to help preserve renal function. Can add back low dose NE as needed. Continue IV amio. Switch heparin to Eliquis.   Glori Bickers, MD  8:52 AM

## 2020-01-07 NOTE — Progress Notes (Signed)
Nutrition Follow-up  DOCUMENTATION CODES:   Not applicable  INTERVENTION:   Ensure Enlive po TID, each supplement provides 350 kcal and 20 grams of protein  Recommend removal of carb modified diet restriction as pt does not have hx of DM and CBGs have been relatively well controlled  NUTRITION DIAGNOSIS:   Inadequate oral intake related to decreased appetite, nausea as evidenced by per patient/family report, meal completion < 50%.  Being addressed via supplements, diet liberalization  GOAL:   Patient will meet greater than or equal to 90% of their needs  Being addressed  MONITOR:   PO intake, Supplement acceptance, Labs, Weight trends  REASON FOR ASSESSMENT:   Malnutrition Screening Tool    ASSESSMENT:   69 yo male admitted with acute on chronic CHF due to NICM with cardiogenic shock. PMH includes chronic CHF, hx of stroke (large L MCA infart with hemorrhagic conversion in 2018), HTN, chronic cholecystitis with chronic perc drain.  11/10 Code stroke for L facial droop and slurred speech  Pt remains on dobutamine and amiodarone gtt; attempting to wean dobutamine today. Per HF team, pt is poor LVAD candidate due to hx of noncomplaince and renal failure.   Pt reports good appetite; recorded po intake 25-100% of meals; pt ate 100% at breakfast this AM   Boost Breeze has been discontinued; pt likes Ensure and drinking well  HgbA1c 4.6; CBGs relatively well controlled, no hx of DM. Recommend removal of carb mod diet to allow more dietary options to promote po intake  Labs: sodium 134 (L), Creatinine 2.93, BUN 44 Meds: ss novolog  Diet Order:  HEART HEALTHY/CARB MODIFIED  EDUCATION NEEDS:   Not appropriate for education at this time  Skin:  Skin Assessment: Skin Integrity Issues: Skin Integrity Issues:: Incisions Incisions: site of perc drain (felt out this admission) draining purulent draiange  Last BM:  11/17  Height:   Ht Readings from Last 1 Encounters:   12/26/19 5\' 7"  (1.702 m)    Weight:   Wt Readings from Last 1 Encounters:  01/07/20 73.2 kg    BMI:  Body mass index is 25.28 kg/m.  Estimated Nutritional Needs:   Kcal:  2200-2400 kcals  Protein:  110-120 g  Fluid:  1.8 L  Kerman Passey MS, RDN, LDN, CNSC Registered Dietitian III Clinical Nutrition RD Pager and On-Call Pager Number Located in [place]

## 2020-01-07 NOTE — Progress Notes (Signed)
[place] for heparin Indication: atrial fibrillation  Allergies  Allergen Reactions  . Benadryl [Diphenhydramine] Palpitations    Patient Measurements: Height: 5\' 7"  (170.2 cm) Weight: 73.2 kg (161 lb 6 oz) IBW/kg (Calculated) : 66.1 Heparin Dosing Weight: 78 kg  Vital Signs: Temp: 97.4 F (36.3 C) (11/18 0700) Temp Source: Oral (11/18 0700) BP: 108/74 (11/18 0900) Pulse Rate: 82 (11/18 0900)  Labs: Recent Labs    01/05/20 0242 01/05/20 0242 01/06/20 0504 01/06/20 1332 01/07/20 0449  HGB 9.1*  --   --   --   --   HCT 29.6*  --   --   --   --   PLT 233  --   --   --   --   HEPARINUNFRC 0.35   < > 0.20* 0.40 0.52  CREATININE 2.62*  --  2.64*  --  2.93*   < > = values in this interval not displayed.    Estimated Creatinine Clearance: 22.9 mL/min (A) (by C-G formula based on SCr of 2.93 mg/dL (H)).   Assessment: 60 yoM admitted with ADHF and AFib now with worsening cardiogenic shock. Pt on apixaban for AFib (last dose 0900 11/9),  transitioned to IV heparin in anticipation of invasive procedures.  Code stroke called 11/10, CT head negative for bleed. Heparin resumed per neuro (adjusted goals, no further boluses).  Heparin level is within goal range this morning at 0.52, on 1550 units/hr. Hgb low but stable, Pltc okay on last check. No bleeding or infusion issues noted.   DCCV was done 11/16; will continue IV heparin for full 48 hrs after, then convert to po Eliquis with this evening's dose.  Scr 2.93; but age < 80 and Wt > 60 kg, so full-dose Eliquis appropriate.  Goal of Therapy:  Heparin level 0.3-0.5 units/ml Monitor platelets by anticoagulation protocol: Yes   Plan: Continue IV heparin at 1550 units/hr for now. Will resume Eliquis 5 mg BID with this evening's dose.   [place] Crane, Roylene Reason, BCCP Clinical Pharmacist  01/07/2020 10:03 AM   [facility] pharmacy phone numbers are listed on [email or web address]

## 2020-01-08 ENCOUNTER — Other Ambulatory Visit: Payer: Self-pay

## 2020-01-08 DIAGNOSIS — I4819 Other persistent atrial fibrillation: Secondary | ICD-10-CM | POA: Diagnosis not present

## 2020-01-08 LAB — GLUCOSE, CAPILLARY
Glucose-Capillary: 111 mg/dL — ABNORMAL HIGH (ref 70–99)
Glucose-Capillary: 107 mg/dL — ABNORMAL HIGH (ref 70–99)
Glucose-Capillary: 121 mg/dL — ABNORMAL HIGH (ref 70–99)
Glucose-Capillary: 99 mg/dL (ref 70–99)

## 2020-01-08 LAB — BASIC METABOLIC PANEL
Anion gap: 9 (ref 5–15)
BUN: 39 mg/dL — ABNORMAL HIGH (ref 8–23)
CO2: 32 mmol/L (ref 22–32)
Calcium: 9.3 mg/dL (ref 8.9–10.3)
Chloride: 94 mmol/L — ABNORMAL LOW (ref 98–111)
Creatinine, Ser: 2.7 mg/dL — ABNORMAL HIGH (ref 0.61–1.24)
GFR, Estimated: 25 mL/min — ABNORMAL LOW (ref >60)
Glucose, Bld: 119 mg/dL — ABNORMAL HIGH (ref 70–99)
Potassium: 4.8 mmol/L (ref 3.5–5.1)
Sodium: 135 mmol/L (ref 135–145)

## 2020-01-08 LAB — CBC
HCT: 27.2 % — ABNORMAL LOW (ref 39.0–52.0)
Hemoglobin: 8.7 g/dL — ABNORMAL LOW (ref 13.0–17.0)
MCH: 30.2 pg (ref 26.0–34.0)
MCHC: 32 g/dL (ref 30.0–36.0)
MCV: 94.4 fL (ref 80.0–100.0)
Platelets: 202 10*3/uL (ref 150–400)
RBC: 2.88 MIL/uL — ABNORMAL LOW (ref 4.22–5.81)
RDW: 18.8 % — ABNORMAL HIGH (ref 11.5–15.5)
WBC: 7 10*3/uL (ref 4.0–10.5)
nRBC: 0 % (ref 0.0–0.2)

## 2020-01-08 LAB — COOXEMETRY PANEL
Carboxyhemoglobin: 2.1 % — ABNORMAL HIGH (ref 0.5–1.5)
Methemoglobin: 0.8 % (ref 0.0–1.5)
O2 Saturation: 55.8 %
Total hemoglobin: 12 g/dL (ref 12.0–16.0)

## 2020-01-08 MED ORDER — TORSEMIDE 20 MG PO TABS: 40.0000 mg | ORAL_TABLET | Freq: Two times a day (BID) | ORAL | Status: DC

## 2020-01-08 MED ORDER — TORSEMIDE 20 MG PO TABS
80.0000 mg | ORAL_TABLET | Freq: Every day | ORAL | Status: DC
  Administered 2020-01-08 – 2020-01-10 (×3): 80 mg via ORAL
  Filled 2020-01-08 (×3): qty 4

## 2020-01-08 NOTE — Progress Notes (Addendum)
Patient ID: Glenn [name], male   DOB: 1950-04-05, 69 y.o.   MRN: [id number]     Advanced Heart Failure Rounding Note  PCP-Cardiologist: Virl Axe, MD   Subjective:    PICC placed 11/6 with initial CO-OX 31% and started Milrinone 0.25. On 11/8 txf ICU due to Co-ox remaining 30-40% on milrinone & started NE.  11/10: Code Stroke for L facial droop w/ slurred speech. Ultimately switch off milrinone and placed on DBA for persistently low co-ox.  Off NE 11/11. DC-CV 11/6.   Yesterday: CVP 14-16, Co-ox 67%, DBA 1 mcg/kg/min, torsemide 40 daily .    Today: CVP 14, Co-ox 59%, Reports chest pain last night across his chest with some shortness of breath.  EKG obtained which showed SR no ST changes.  Pain resolved on it's own. Denies nausea or diaphoresis with episode.  Only 1.1L off yesterday with increase in weight.     Objective:   Weight Range: 75.2 kg Body mass index is 25.97 kg/m.   Vital Signs:   Temp:  [97.1 F (36.2 C)-99.6 F (37.6 C)] 97.8 F (36.6 C) (11/19 0317) Pulse Rate:  [73-96] 84 (11/19 0317) Resp:  [15-28] 15 (11/19 0317) BP: (98-121)/(57-84) 98/84 (11/19 0317) SpO2:  [92 %-98 %] 95 % (11/19 0317) Weight:  [75.2 kg] 75.2 kg (11/19 0317) Last BM Date: 01/06/20  Weight change: Filed Weights   01/06/20 0500 01/07/20 0500 01/08/20 0317  Weight: 72.2 kg 73.2 kg 75.2 kg    Intake/Output:   Intake/Output Summary (Last 24 hours) at 01/08/2020 0642 Last data filed at 01/08/2020 0327 Gross per 24 hour  Intake 1116.29 ml  Output 1125 ml  Net -8.71 ml      Physical Exam   CVP 14 General:  Well appearing. No resp difficulty.  HEENT: normal Neck: supple. no JVD. Carotids 2+ bilat; no bruits. No lymphadenopathy or thryomegaly appreciated. Cor: PMI nondisplaced. Regular rate & rhythm. No rubs or gallops. +murmurs. Lungs: Diminished left lower base.  Abdomen: soft, nontender, nondistended. No hepatosplenomegaly. No bruits or masses. Good bowel  sounds. Extremities: no cyanosis, clubbing, rash, edema Neuro: alert & oriented x 3, cranial nerves grossly intact. moves all 4 extremities w/o difficulty, remains weaker to LUE 4/5 strength.  Flat L nasolabial fold. . Affect pleasant    Telemetry   1st AVB, PR 0.21, rates 80s.  Personally reviewed.   Labs    CBC Recent Labs    01/08/20 0500  WBC 7.0  HGB 8.7*  HCT 27.2*  MCV 94.4  PLT 259   Basic Metabolic Panel Recent Labs    01/07/20 0449 01/08/20 0500  NA 134* 135  K 4.4 4.8  CL 92* 94*  CO2 31 32  GLUCOSE 107* 119*  BUN 44* 39*  CREATININE 2.93* 2.70*  CALCIUM 9.4 9.3   Liver Function Tests No results for input(s): AST, ALT, ALKPHOS, BILITOT, PROT, ALBUMIN in the last 72 hours. No results for input(s): LIPASE, AMYLASE in the last 72 hours. Cardiac Enzymes No results for input(s): CKTOTAL, CKMB, CKMBINDEX, TROPONINI in the last 72 hours.  BNP: BNP (last 3 results) Recent Labs    06/01/19 1413 06/23/19 1527 12/25/19 1757  BNP 531.4* 613.2* 776.7*    ProBNP (last 3 results) No results for input(s): PROBNP in the last 8760 hours.   D-Dimer No results for input(s): DDIMER in the last 72 hours. Hemoglobin A1C No results for input(s): HGBA1C in the last 72 hours. Fasting Lipid Panel No results for input(s): CHOL,  HDL, LDLCALC, TRIG, CHOLHDL, LDLDIRECT in the last 72 hours. Thyroid Function Tests No results for input(s): TSH, T4TOTAL, T3FREE, THYROIDAB in the last 72 hours.  Invalid input(s): FREET3  Other results:   Imaging    No results found.   Medications:     Scheduled Medications: . apixaban  5 mg Oral BID  . atorvastatin  40 mg Oral QPM  . Chlorhexidine Gluconate Cloth  6 each Topical Daily  . docusate sodium  100 mg Oral BID  . feeding supplement  237 mL Oral TID BM  . gabapentin  300 mg Oral BID  . insulin aspart  0-9 Units Subcutaneous TID WC  . melatonin  3 mg Oral QHS  . pantoprazole  40 mg Oral Daily  . sodium chloride  flush  10-40 mL Intracatheter Q12H  . torsemide  40 mg Oral Daily    Infusions: . amiodarone 30 mg/hr (01/07/20 1305)  . DOBUTamine 1 mcg/kg/min (01/07/20 2003)  . norepinephrine (LEVOPHED) Adult infusion Stopped (01/03/20 1615)    PRN Medications: acetaminophen, ALPRAZolam, calcium carbonate, ondansetron (ZOFRAN) IV, polyethylene glycol, sodium chloride flush    Patient Profile  69 y.o.malewith a hx of chronic systolic HF 2/2 NICM s/pBSciICD, persistent atrial fibrillation, h/o stroke (Lg L MCA infarct w/ hemorrhagic conversionin 2018), HTN, HCV, tobacco abuse (smokes ~1/4 ppd),  and chronic cholecystitiswith achronic perc drain.   Admitted with ADHF. Started on milrinone>> later transitioned to DBA for persistently low co-ox.    Assessment/Plan   1. Acute on chronic systolic HF due to NICM -> cardiogenic shock - suspect AF cardiomyopathy - ICD placed Nov 2015 - [facility] 10/2018 showed normal cors, elevated filling pressures and low CI at 1.9 - TTE 11/2018 LVEF 20%, mild RV dysfunction - Admit 06/2019 with severe HF, required IABP and milrinone. TEE 5/21 EF 20-25% with moderate to severe RV dysfunction.  - Admitted 12/25/2019 with ADHF. Milrinone 0.25 mcg started 11/7 due to low output HF. Echo 12/28/19 EF 20-25% RV mildly decreased. Moved to ICU on 11/8 due to persistently low co-ox on milrinone. 12/29/19: Continued w/ persistently low co-ox despite milrinone 0.5 and NE which was stopped d/t elevated MAPs. Gave metolazone 2/2 low UOP. Transitioned to dobutamine gtt starting at 2.5 mcg then increased to 3 mcg. Required NE but has been off since 11/11.  - Suspect RV function worse than it looks on echo.  - Today: CVP 14, co-ox 59%   - CVP goal around 10 with RV failure.  - Torsemide torsemide 40 mg PO BID   - Continue dobutamine today to 1 mcg/kg/min - No beta blockers due to low output HF - No dig or ACE/ARB/ARNI with worsening renal function.  - Options limited - poor LVAD  candidate with noncompliance and renal failure. He did not make one visit to HF or EP clinic after dramatic hospitalization for AF and cardiogenic shock in 5/21  2. Concerning for acute ischemic R MCA -hx of large R MCA with hemorrhagic conversion in Sept 2020 & L MCA s/p thrombectomy M2 occlusion in 2018.  -11/10 around 14:00 developed new L facial droop, LUE weakness and slurred speech - Code Stroke called.  Neuro recs appreciated, did not tolerate change in ICD settings for MRI/MRA; Repeat CT on 11/12 no acute findings, prior remote R MCA infarct. - A1c 4.6%, LDL 63, HDL 26, Cholesterol 103, Triglycerides 68 - Atrovastatin dose increased from home dose.   3.  Persistent Afib with RVR - In 5/21 had refractory AF requiring  multiple DC-CVs, high-dose amio and ranexa.  -He reported intermittent compliance with amio and Eliquis. Out of eliquis prior to admission. Transitioned from eliquis to heparin gtt due to possible need for procedures on 11/9 and planned for TEE DC-CV was scheduled 11/10, however canceled due to new neuro symptoms. - Stopped ranexa on 11/9. - DCCV 11/17 converted to SR. - Continue with amiodarone 30 gtt while on DBA. - Continue with eliquis 5 mg BID   4. Acute on CKD IIIb - Baseline Cr looks to be around 1.6 to 1.7 - Admission Cr 1.69 and Peak Cr 2.9 -Today creatinine stable at 2.7, UOP -1.1L - Likely ATN/cardiorenal - Continue to monitor BMET   5. Elevated troponin - 10/2018 cath clean coronaries - elevated trops due to afib with RVR, CHF - Episode of chest pain and shortness of breath that resolved last night.  EKG obtained no ST changes.   6. Chronic cholecystitis with perc drain -  Per primary team. Perc drain now fell out -  Will follow up outpatient   7. Iron deficiency anemia - Iron sats low and has received feraheme. - Hgb stable at 9.1 (11/16)  8. Depression - Watch QT closely on amio.  - Stopped zoloft & atarax 11/10 d/t prolong QT.   - Ranexa  stopped 11/9.  -EKG 11/19 QTc 504   Arlyss Gandy, NP 6:42 AM   Patient seen and examined with the above-signed Advanced Practice Provider and/or Housestaff. I personally reviewed laboratory data, imaging studies and relevant notes. I independently examined the patient and formulated the important aspects of the plan. I have edited the note to reflect any of my changes or salient points. I have personally discussed the plan with the patient and/or family.  Remains in NSR on IV amio. Still on DBA at 1. Co-ox down to 59%. Poor urine output on po torsemide 40. Creatinine improved. Mild CP overnight now resolved. Denies dyspnea, orthopnea or PND.   General:  Lying in bed No resp difficulty HEENT: normal Neck: supple. no JVP to jaw . Carotids 2+ bilat; no bruits. No lymphadenopathy or thryomegaly appreciated. Cor: PMI nondisplaced. Regular rate & rhythm. No rubs, gallops or murmurs. Lungs: clear Abdomen: soft, nontender, nondistended. No hepatosplenomegaly. No bruits or masses. Good bowel sounds. Extremities: no cyanosis, clubbing, rash, edema Neuro: alert & orientedx3, cranial nerves grossly intact. moves all 4 extremities w/o difficulty. Affect pleasant  Maintaining NSR on IV amio. Weaning DBA. Will keep at 1 today. Increase torsemide to 80 daily. Switch heparin to Eliquis. Ambulate.   Glori Bickers, MD  9:15 AM

## 2020-01-08 NOTE — Progress Notes (Addendum)
Physical Therapy Treatment Patient Details Name: Glenn [name] MRN: [id number] DOB: [birth year] Today's Date: 01/08/2020    History of Present Illness Glenn [name] is a 69 y.o. male with a hx of chronic systolic HF 2/2 NICM s/p BSci ICD, atrial fibrillation, h/o stroke (Lg L MCA infarct w/ hemorrhagic conversion in 2018), h/o DVT, chornic a/c therapy, HTN, HLD, pulmonary nodule, Hepatitis C, carpal tunnel,  tobacco abuse (smokes ~1/4 ppd) and chronic cholecystitis with a chronic perc drain, who is being seen today for the evaluation of Afib RVR and CHF at the request of Dr Regenia Skeeter. Pt experienced onset of L side weakness on 12/30/19, CT negative for acute changes, pt cannot have an MRI.    PT Comments    Patient progressing well towards PT goals. Reports no episodes of chest pain or dizziness during session. Pt in good spirits. Tolerated gait training with Min guard assist for safety and use of RW for support. VSS on RA during ambulation. Noted to have LLE weakness which fatigues especially towards end of gait. Encouraged increasing activity while in the hospital. Plan for stair training next session as tolerated. Will continue to follow.    Follow Up Recommendations  Home health PT;Supervision for mobility/OOB     Equipment Recommendations  Rolling walker with 5" wheels    Recommendations for Other Services       Precautions / Restrictions Precautions Precautions: Fall Restrictions Weight Bearing Restrictions: No    Mobility  Bed Mobility Overal bed mobility: Needs Assistance Bed Mobility: Supine to Sit;Sit to Supine     Supine to sit: Modified independent (Device/Increase time);HOB elevated Sit to supine: Modified independent (Device/Increase time);HOB elevated   General bed mobility comments: no assist needed, help to manage lines.  Transfers Overall transfer level: Needs assistance Equipment used: Rolling walker (2 wheeled) Transfers: Sit to/from  Stand Sit to Stand: Supervision         General transfer comment: for safety and lines  Ambulation/Gait Ambulation/Gait assistance: Min guard Gait Distance (Feet): 380 Feet Assistive device: Rolling walker (2 wheeled) Gait Pattern/deviations: Decreased weight shift to left;Decreased step length - right;Decreased dorsiflexion - left;Drifts right/left Gait velocity: decreased   General Gait Details: Slow, mostly steady gait with decreased left foot clearance esp towards end of gait when fatigued. HR stable.   Stairs             Wheelchair Mobility    Modified Rankin (Stroke Patients Only)       Balance Overall balance assessment: Needs assistance Sitting-balance support: Feet supported;No upper extremity supported Sitting balance-Leahy Scale: Good Sitting balance - Comments: Pt able to sit EOB without UE support   Standing balance support: During functional activity Standing balance-Leahy Scale: Poor Standing balance comment: reliant on RW for dynamic balance                            Cognition Arousal/Alertness: Awake/alert Behavior During Therapy: WFL for tasks assessed/performed Overall Cognitive Status: Within Functional Limits for tasks assessed                                        Exercises      General Comments General comments (skin integrity, edema, etc.): VSS during ambulation.      Pertinent Vitals/Pain Pain Assessment: No/denies pain    Home Living  Prior Function            PT Goals (current goals can now be found in the care plan section) Progress towards PT goals: Progressing toward goals    Frequency    Min 3X/week      PT Plan Current plan remains appropriate    Co-evaluation              AM-PAC PT "6 Clicks" Mobility   Outcome Measure  Help needed turning from your back to your side while in a flat bed without using bedrails?: None Help needed moving  from lying on your back to sitting on the side of a flat bed without using bedrails?: None Help needed moving to and from a bed to a chair (including a wheelchair)?: None Help needed standing up from a chair using your arms (e.g., wheelchair or bedside chair)?: None Help needed to walk in hospital room?: A Little Help needed climbing 3-5 steps with a railing? : A Little 6 Click Score: 22    End of Session Equipment Utilized During Treatment: Gait belt Activity Tolerance: Patient tolerated treatment well Patient left: in bed;with call bell/phone within reach Nurse Communication: Mobility status PT Visit Diagnosis: Difficulty in walking, not elsewhere classified (R26.2)     Time: 5035-4656 PT Time Calculation (min) (ACUTE ONLY): 20 min  Charges:  $Gait Training: 8-22 mins                     Marisa Severin, PT, DPT Acute Rehabilitation Services Pager [phone] Office [place] 01/08/2020, 12:49 PM

## 2020-01-08 NOTE — Plan of Care (Signed)
  Problem: Education: Goal: Understanding of medication regimen will improve Outcome: Progressing   Problem: Cardiac: Goal: Ability to achieve and maintain adequate cardiopulmonary perfusion will improve Outcome: Progressing   

## 2020-01-08 NOTE — Progress Notes (Signed)
°  Speech Language Pathology Treatment: Cognitive-Linquistic  Patient Details Name: Glenn [name] MRN: [id number] DOB: [birth year] Today's Date: 01/08/2020 Time: 7858-8502 SLP Time Calculation (min) (ACUTE ONLY): 25 min  Assessment / Plan / Recommendation Clinical Impression  Pt was seen for cognitive-linguiistic treatment. He was alert and cooperative during the session and demonstrated anticipatory awarenss of physical deficits. He achieved 60% accuracy with time management problems increasing to 100% with cues. He completed an ADL verbal sequencing task related to cooking and baking without cues. He demonstrated 50% accuracy with a 4-word-sentence-scramble mental manipulation task increasing to 100% with cues. He was educated regarding use of rehearsal for improved retention and verbalized understanding. He reported use of this strategy and achieved 60% accuracy with recall of concrete information from recorded voicemails increasing to 80% with cues. SLP will continue to follow pt.    HPI HPI: Glenn [name] is a 69 y.o. male with history of previous strokes with resultant LLE weakness who developed worsening recurrent L sided deficits which are improving to baseline. Possible infarct; CT negative, . He developed  Slurred speech and left sided weakness while Eliquis was held for elective cardioversion and he was on IV heparin due to subtherapeutic level.  PMHx: hemorrhagic stroke with left weakness, PAF, Ischemic cardiomyopathy, DVT, HTN, HLD. Pt was last seen by SLP 12/18/18 with recommendation of regular texture solids and thin liquids      SLP Plan  Continue with current plan of care       Recommendations                   Follow up Recommendations: Inpatient Rehab SLP Visit Diagnosis: Dysarthria and anarthria (R47.1);Cognitive communication deficit (R41.841) Plan: Continue with current plan of care       Jermari Tamargo I. Hardin Negus, [place], [place] Office number [phone] Pager [place] 01/08/2020, 1:36 PM

## 2020-01-09 DIAGNOSIS — I4819 Other persistent atrial fibrillation: Secondary | ICD-10-CM | POA: Diagnosis not present

## 2020-01-09 LAB — BASIC METABOLIC PANEL
Anion gap: 12 (ref 5–15)
BUN: 37 mg/dL — ABNORMAL HIGH (ref 8–23)
CO2: 29 mmol/L (ref 22–32)
Calcium: 9.4 mg/dL (ref 8.9–10.3)
Chloride: 93 mmol/L — ABNORMAL LOW (ref 98–111)
Creatinine, Ser: 2.66 mg/dL — ABNORMAL HIGH (ref 0.61–1.24)
GFR, Estimated: 26 mL/min — ABNORMAL LOW (ref >60)
Glucose, Bld: 111 mg/dL — ABNORMAL HIGH (ref 70–99)
Potassium: 4 mmol/L (ref 3.5–5.1)
Sodium: 134 mmol/L — ABNORMAL LOW (ref 135–145)

## 2020-01-09 LAB — GLUCOSE, CAPILLARY
Glucose-Capillary: 107 mg/dL — ABNORMAL HIGH (ref 70–99)
Glucose-Capillary: 140 mg/dL — ABNORMAL HIGH (ref 70–99)
Glucose-Capillary: 164 mg/dL — ABNORMAL HIGH (ref 70–99)
Glucose-Capillary: 102 mg/dL — ABNORMAL HIGH (ref 70–99)

## 2020-01-09 LAB — COOXEMETRY PANEL
Carboxyhemoglobin: 1.9 % — ABNORMAL HIGH (ref 0.5–1.5)
Carboxyhemoglobin: 1.9 % — ABNORMAL HIGH (ref 0.5–1.5)
Methemoglobin: 0.7 % (ref 0.0–1.5)
Methemoglobin: 1 % (ref 0.0–1.5)
O2 Saturation: 48.3 %
O2 Saturation: 39.4 %
Total hemoglobin: 8.8 g/dL — ABNORMAL LOW (ref 12.0–16.0)
Total hemoglobin: 8.8 g/dL — ABNORMAL LOW (ref 12.0–16.0)

## 2020-01-09 MED ORDER — FUROSEMIDE 10 MG/ML IJ SOLN
80.0000 mg | Freq: Once | INTRAMUSCULAR | Status: AC
  Administered 2020-01-09: 80 mg via INTRAVENOUS
  Filled 2020-01-09: qty 8

## 2020-01-09 MED ORDER — POTASSIUM CHLORIDE CRYS ER 20 MEQ PO TBCR
40.0000 meq | EXTENDED_RELEASE_TABLET | Freq: Once | ORAL | Status: AC
  Administered 2020-01-09: 40 meq via ORAL
  Filled 2020-01-09: qty 2

## 2020-01-09 MED ORDER — POTASSIUM CHLORIDE CRYS ER 20 MEQ PO TBCR: 20.0000 meq | EXTENDED_RELEASE_TABLET | Freq: Once | ORAL | Status: DC

## 2020-01-09 NOTE — Progress Notes (Signed)
Patient ID: Glenn [name], male   DOB: 1950/05/20, 69 y.o.   MRN: [id number]     Advanced Heart Failure Rounding Note  PCP-Cardiologist: Virl Axe, MD   Subjective:    PICC placed 11/6 with initial CO-OX 31% and started Milrinone 0.25. On 11/8 txf ICU due to Co-ox remaining 30-40% on milrinone & started NE.  11/10: Code Stroke for L facial droop w/ slurred speech. Ultimately switch off milrinone and placed on DBA for persistently low co-ox.  Off NE 11/11. DC-CV 11/6.   On DBA at 1. Co-ox down to 39%. CVP 15. Feels more SOB. + orthopnea/PND. No CP.   Remains on IV amio. In NSR.    Objective:   Weight Range: 75.6 kg Body mass index is 26.1 kg/m.   Vital Signs:   Temp:  [97.5 F (36.4 C)-98.6 F (37 C)] 97.6 F (36.4 C) (11/20 1120) Pulse Rate:  [70-86] 72 (11/20 1120) Resp:  [17-21] 21 (11/20 1120) BP: (97-119)/(51-67) 114/66 (11/20 1120) SpO2:  [94 %-98 %] 98 % (11/20 1120) Weight:  [75.6 kg] 75.6 kg (11/20 0429) Last BM Date: 01/06/20  Weight change: Filed Weights   01/07/20 0500 01/08/20 0317 01/09/20 0429  Weight: 73.2 kg 75.2 kg 75.6 kg    Intake/Output:   Intake/Output Summary (Last 24 hours) at 01/09/2020 1140 Last data filed at 01/09/2020 0735 Gross per 24 hour  Intake 873.16 ml  Output 650 ml  Net 223.16 ml      Physical Exam   CVP 15 General:  Lying flat. No resp difficulty HEENT: normal Neck: supple. JVP 15. Carotids 2+ bilat; no bruits. No lymphadenopathy or thryomegaly appreciated. Cor: PMI nondisplaced. Regular rate & rhythm. + s3 Lungs: clear Abdomen: soft, nontender, nondistended. No hepatosplenomegaly. No bruits or masses. Good bowel sounds. Extremities: no cyanosis, clubbing, rash, edema Neuro: alert & orientedx3, cranial nerves grossly intact. moves all 4 extremities w/o difficulty. Affect pleasant   Telemetry   NSR  70-80s Personally reviewed  Labs    CBC Recent Labs    01/08/20 0500  WBC 7.0  HGB 8.7*  HCT 27.2*   MCV 94.4  PLT 284   Basic Metabolic Panel Recent Labs    01/08/20 0500 01/09/20 0547  NA 135 134*  K 4.8 4.0  CL 94* 93*  CO2 32 29  GLUCOSE 119* 111*  BUN 39* 37*  CREATININE 2.70* 2.66*  CALCIUM 9.3 9.4   Liver Function Tests No results for input(s): AST, ALT, ALKPHOS, BILITOT, PROT, ALBUMIN in the last 72 hours. No results for input(s): LIPASE, AMYLASE in the last 72 hours. Cardiac Enzymes No results for input(s): CKTOTAL, CKMB, CKMBINDEX, TROPONINI in the last 72 hours.  BNP: BNP (last 3 results) Recent Labs    06/01/19 1413 06/23/19 1527 12/25/19 1757  BNP 531.4* 613.2* 776.7*    ProBNP (last 3 results) No results for input(s): PROBNP in the last 8760 hours.   D-Dimer No results for input(s): DDIMER in the last 72 hours. Hemoglobin A1C No results for input(s): HGBA1C in the last 72 hours. Fasting Lipid Panel No results for input(s): CHOL, HDL, LDLCALC, TRIG, CHOLHDL, LDLDIRECT in the last 72 hours. Thyroid Function Tests No results for input(s): TSH, T4TOTAL, T3FREE, THYROIDAB in the last 72 hours.  Invalid input(s): FREET3  Other results:   Imaging    No results found.   Medications:     Scheduled Medications: . apixaban  5 mg Oral BID  . atorvastatin  40 mg Oral QPM  .  Chlorhexidine Gluconate Cloth  6 each Topical Daily  . docusate sodium  100 mg Oral BID  . feeding supplement  237 mL Oral TID BM  . furosemide  80 mg Intravenous Once  . gabapentin  300 mg Oral BID  . insulin aspart  0-9 Units Subcutaneous TID WC  . melatonin  3 mg Oral QHS  . pantoprazole  40 mg Oral Daily  . potassium chloride  40 mEq Oral Once  . sodium chloride flush  10-40 mL Intracatheter Q12H  . torsemide  80 mg Oral Daily    Infusions: . amiodarone 30 mg/hr (01/09/20 0600)  . DOBUTamine 2 mcg/kg/min (01/09/20 1118)  . norepinephrine (LEVOPHED) Adult infusion Stopped (01/03/20 1615)    PRN Medications: acetaminophen, ALPRAZolam, calcium carbonate,  ondansetron (ZOFRAN) IV, polyethylene glycol, sodium chloride flush    Patient Profile  69 y.o.malewith a hx of chronic systolic HF 2/2 NICM s/pBSciICD, persistent atrial fibrillation, h/o stroke (Lg L MCA infarct w/ hemorrhagic conversionin 2018), HTN, HCV, tobacco abuse (smokes ~1/4 ppd),  and chronic cholecystitiswith achronic perc drain.   Admitted with ADHF. Started on milrinone>> later transitioned to DBA for persistently low co-ox.    Assessment/Plan   1. Acute on chronic systolic HF due to NICM -> cardiogenic shock - suspect AF cardiomyopathy - ICD placed Nov 2015 - [facility] 10/2018 showed normal cors, elevated filling pressures and low CI at 1.9 - TTE 11/2018 LVEF 20%, mild RV dysfunction - Admit 06/2019 with severe HF, required IABP and milrinone. TEE 5/21 EF 20-25% with moderate to severe RV dysfunction.  - Admitted 12/25/2019 with ADHF. Milrinone 0.25 mcg started 11/7 due to low output HF. Echo 12/28/19 EF 20-25% RV mildly decreased. Moved to ICU on 11/8 due to persistently low co-ox on milrinone. 12/29/19: Continued w/ persistently low co-ox despite milrinone 0.5 and NE which was stopped d/t elevated MAPs. Gave metolazone 2/2 low UOP. Transitioned to dobutamine gtt starting at 2.5 mcg then increased to 3 mcg. Required NE but has been off since 11/11.  - Suspect RV function worse than it looks on echo.  - On dobutamine at 1. Co-ox 59% -> 39% - CVP 15 - CVP goal around 10 with RV failure.  - Increase dobutamine to 2. Give lasix 80 IV - No beta blockers due to low output HF - No dig or ACE/ARB/ARNI with worsening renal function.  - Options remain very limited - poor LVAD candidate with noncompliance and renal failure. He did not make one visit to HF or EP clinic after dramatic hospitalization for AF and cardiogenic shock in 5/21  2. Concerning for acute ischemic R MCA -hx of large R MCA with hemorrhagic conversion in Sept 2020 & L MCA s/p thrombectomy M2 occlusion in 2018.   -11/10 around 14:00 developed new L facial droop, LUE weakness and slurred speech - Code Stroke called.  Neuro recs appreciated, did not tolerate change in ICD settings for MRI/MRA; Repeat CT on 11/12 no acute findings, prior remote R MCA infarct. - A1c 4.6%, LDL 63, HDL 26, Cholesterol 103, Triglycerides 68 - Atrovastatin dose increased from home dose.   3.  Persistent Afib with RVR - In 5/21 had refractory AF requiring multiple DC-CVs, high-dose amio and ranexa.  -He reported intermittent compliance with amio and Eliquis. Out of eliquis prior to admission. Transitioned from eliquis to heparin gtt due to possible need for procedures on 11/9 and planned for TEE DC-CV was scheduled 11/10, however canceled due to new neuro symptoms. - Stopped  ranexa on 11/9. - DCCV 11/17 converted to SR. - Remains in NSR. Continue with amiodarone 30 gtt while on DBA. - Continue with eliquis 5 mg BID   4. Acute on CKD IIIb - Baseline Cr looks to be around 1.6 to 1.7 - Admission Cr 1.69 and Peak Cr 2.9 -Today creatinine stable at 2.7, - Likely ATN/cardiorenal - Continue to monitor BMET daily   5. Elevated troponin - 10/2018 cath clean coronaries - elevated trops due to afib with RVR, CHF - Episode of chest pain and shortness of breath that resolved last night.  EKG obtained no ST changes.   6. Chronic cholecystitis with perc drain -  Per primary team. Perc drain now fell out -  Will follow up outpatient   7. Iron deficiency anemia - Iron sats low and has received feraheme. - Hgb stable at 8.8  8. Depression - Watch QT closely on amio.  - Stopped zoloft & atarax 11/10 d/t prolong QT.   - Ranexa stopped 11/9.  -EKG 11/19 QTc 504   Glori Bickers, MD  11:45 AM

## 2020-01-10 DIAGNOSIS — I4819 Other persistent atrial fibrillation: Secondary | ICD-10-CM | POA: Diagnosis not present

## 2020-01-10 LAB — BASIC METABOLIC PANEL
Anion gap: 11 (ref 5–15)
BUN: 36 mg/dL — ABNORMAL HIGH (ref 8–23)
CO2: 29 mmol/L (ref 22–32)
Calcium: 8.8 mg/dL — ABNORMAL LOW (ref 8.9–10.3)
Chloride: 95 mmol/L — ABNORMAL LOW (ref 98–111)
Creatinine, Ser: 2.77 mg/dL — ABNORMAL HIGH (ref 0.61–1.24)
GFR, Estimated: 24 mL/min — ABNORMAL LOW (ref >60)
Glucose, Bld: 155 mg/dL — ABNORMAL HIGH (ref 70–99)
Potassium: 3.9 mmol/L (ref 3.5–5.1)
Sodium: 135 mmol/L (ref 135–145)

## 2020-01-10 LAB — SODIUM, URINE, RANDOM: Sodium, Ur: 86 mmol/L

## 2020-01-10 LAB — GLUCOSE, CAPILLARY
Glucose-Capillary: 108 mg/dL — ABNORMAL HIGH (ref 70–99)
Glucose-Capillary: 102 mg/dL — ABNORMAL HIGH (ref 70–99)
Glucose-Capillary: 122 mg/dL — ABNORMAL HIGH (ref 70–99)
Glucose-Capillary: 121 mg/dL — ABNORMAL HIGH (ref 70–99)

## 2020-01-10 LAB — COOXEMETRY PANEL
Carboxyhemoglobin: 1.7 % — ABNORMAL HIGH (ref 0.5–1.5)
Methemoglobin: 0 % (ref 0.0–1.5)
O2 Saturation: 52.4 %
Total hemoglobin: 8.6 g/dL — ABNORMAL LOW (ref 12.0–16.0)

## 2020-01-10 LAB — MAGNESIUM: Magnesium: 2.3 mg/dL (ref 1.7–2.4)

## 2020-01-10 MED ORDER — FUROSEMIDE 10 MG/ML IJ SOLN
80.0000 mg | Freq: Two times a day (BID) | INTRAMUSCULAR | Status: DC
  Administered 2020-01-10 – 2020-01-11 (×2): 80 mg via INTRAVENOUS
  Filled 2020-01-10 (×3): qty 8

## 2020-01-10 MED ORDER — POTASSIUM CHLORIDE CRYS ER 20 MEQ PO TBCR
40.0000 meq | EXTENDED_RELEASE_TABLET | Freq: Once | ORAL | Status: AC
  Administered 2020-01-10: 40 meq via ORAL
  Filled 2020-01-10: qty 2

## 2020-01-10 NOTE — Progress Notes (Signed)
Patient ID: JYRON TURMAN, male   DOB: Jun 24, 1950, 69 y.o.   MRN: [id number]     Advanced Heart Failure Rounding Note  PCP-Cardiologist: Virl Axe, MD   Subjective:    PICC placed 11/6 with initial CO-OX 31% and started Milrinone 0.25. On 11/8 txf ICU due to Co-ox remaining 30-40% on milrinone & started NE.  11/10: Code Stroke for L facial droop w/ slurred speech. Ultimately switch off milrinone and placed on DBA for persistently low co-ox.  Off NE 11/11. DC-CV 11/6.   Co-ox down to 39% yesterday and dobutamine increased to 2. Co-ox  52% this am. CVP remains elevated at 20  Creatinine stable at 2.7  Remains in NSR on amio. Now on Eliquis. No bleeding.    Objective:   Weight Range: 75.3 kg Body mass index is 26 kg/m.   Vital Signs:   Temp:  [97.5 F (36.4 C)-98.1 F (36.7 C)] 97.5 F (36.4 C) (11/21 1054) Pulse Rate:  [72-82] 72 (11/21 0346) Resp:  [14-21] 16 (11/21 0900) BP: (90-130)/(50-86) 90/57 (11/21 1054) SpO2:  [98 %-99 %] 98 % (11/21 0346) Weight:  [75.3 kg] 75.3 kg (11/21 0346) Last BM Date: 01/06/20  Weight change: Filed Weights   01/08/20 0317 01/09/20 0429 01/10/20 0346  Weight: 75.2 kg 75.6 kg 75.3 kg    Intake/Output:   Intake/Output Summary (Last 24 hours) at 01/10/2020 1059 Last data filed at 01/10/2020 0607 Gross per 24 hour  Intake 694.53 ml  Output 1450 ml  Net -755.47 ml      Physical Exam   General:  Lying flat in bed. No resp difficulty HEENT: normal Neck: supple. JVP to jaw Carotids 2+ bilat; no bruits. No lymphadenopathy or thryomegaly appreciated. Cor: PMI nondisplaced. Regular rate & rhythm. + RV lift and s3 Lungs: clear Abdomen: soft, nontender, nondistended. No hepatosplenomegaly. No bruits or masses. Good bowel sounds. Extremities: no cyanosis, clubbing, rash, edema Neuro: alert & orientedx3, cranial nerves grossly intact. moves all 4 extremities w/o difficulty. Affect pleasant   Telemetry   NSR 70-80s Personally  reviewed  Labs    CBC Recent Labs    01/08/20 0500  WBC 7.0  HGB 8.7*  HCT 27.2*  MCV 94.4  PLT 856   Basic Metabolic Panel Recent Labs    01/09/20 0547 01/10/20 0455  NA 134* 135  K 4.0 3.9  CL 93* 95*  CO2 29 29  GLUCOSE 111* 155*  BUN 37* 36*  CREATININE 2.66* 2.77*  CALCIUM 9.4 8.8*  MG  --  2.3   Liver Function Tests No results for input(s): AST, ALT, ALKPHOS, BILITOT, PROT, ALBUMIN in the last 72 hours. No results for input(s): LIPASE, AMYLASE in the last 72 hours. Cardiac Enzymes No results for input(s): CKTOTAL, CKMB, CKMBINDEX, TROPONINI in the last 72 hours.  BNP: BNP (last 3 results) Recent Labs    06/01/19 1413 06/23/19 1527 12/25/19 1757  BNP 531.4* 613.2* 776.7*    ProBNP (last 3 results) No results for input(s): PROBNP in the last 8760 hours.   D-Dimer No results for input(s): DDIMER in the last 72 hours. Hemoglobin A1C No results for input(s): HGBA1C in the last 72 hours. Fasting Lipid Panel No results for input(s): CHOL, HDL, LDLCALC, TRIG, CHOLHDL, LDLDIRECT in the last 72 hours. Thyroid Function Tests No results for input(s): TSH, T4TOTAL, T3FREE, THYROIDAB in the last 72 hours.  Invalid input(s): FREET3  Other results:   Imaging    No results found.   Medications:  Scheduled Medications:  apixaban  5 mg Oral BID   atorvastatin  40 mg Oral QPM   Chlorhexidine Gluconate Cloth  6 each Topical Daily   docusate sodium  100 mg Oral BID   feeding supplement  237 mL Oral TID BM   gabapentin  300 mg Oral BID   insulin aspart  0-9 Units Subcutaneous TID WC   melatonin  3 mg Oral QHS   pantoprazole  40 mg Oral Daily   sodium chloride flush  10-40 mL Intracatheter Q12H   torsemide  80 mg Oral Daily    Infusions:  amiodarone 30 mg/hr (01/09/20 2234)   DOBUTamine 2 mcg/kg/min (01/09/20 1746)   norepinephrine (LEVOPHED) Adult infusion Stopped (01/03/20 1615)    PRN Medications: acetaminophen,  ALPRAZolam, calcium carbonate, ondansetron (ZOFRAN) IV, polyethylene glycol, sodium chloride flush    Patient Profile  69 y.o.malewith a hx of chronic systolic HF 2/2 NICM s/pBSciICD, persistent atrial fibrillation, h/o stroke (Lg L MCA infarct w/ hemorrhagic conversionin 2018), HTN, HCV, tobacco abuse (smokes ~1/4 ppd),  and chronic cholecystitiswith achronic perc drain.   Admitted with ADHF. Started on milrinone>> later transitioned to DBA for persistently low co-ox.    Assessment/Plan   1. Acute on chronic systolic biventricular  HF due to NICM -> cardiogenic shock - suspect AF cardiomyopathy - ICD placed Nov 2015 - [facility] 10/2018 showed normal cors, elevated filling pressures and low CI at 1.9 - TTE 11/2018 LVEF 20%, mild RV dysfunction - Admit 06/2019 with severe HF, required IABP and milrinone. TEE 5/21 EF 20-25% with moderate to severe RV dysfunction.  - Admitted 12/25/2019 with ADHF. Milrinone 0.25 mcg started 11/7 due to low output HF. Echo 12/28/19 EF 20-25% RV mildly decreased. Moved to ICU on 11/8 due to persistently low co-ox on milrinone. 12/29/19: Continued w/ persistently low co-ox despite milrinone 0.5 and NE which was stopped d/t elevated MAPs. Gave metolazone 2/2 low UOP. Transitioned to dobutamine gtt starting at 2.5 mcg then increased to 3 mcg. Required NE but has been off since 11/11.  - Suspect RV function worse than it looks on echo.  - Co-ox down to 39% yesterday and dobutamine increased to 2. Co-ox  52% this am. CVP remains elevated at 15-16. Creatinine stable at 2.7 - CVP 20 - CVP goal around 10 with RV failure.  -Continue dobutamine to 2. Resume IV lasix . - No beta blockers due to low output HF - No dig or ACE/ARB/ARNI with worsening renal function.  - Options remain very limited - poor LVAD candidate with noncompliance and renal failure. He did not make one visit to HF or EP clinic after dramatic hospitalization for AF and cardiogenic shock in 5/21 - Main  issue seems to be RV failure. Hard to know what endpoint will be a this time. Hopefully will have some EF improvement with maintaining NSR.   2. Concerning for acute ischemic R MCA -hx of large R MCA with hemorrhagic conversion in Sept 2020 & L MCA s/p thrombectomy M2 occlusion in 2018.  -11/10 around 14:00 developed new L facial droop, LUE weakness and slurred speech - Code Stroke called.  Neuro recs appreciated, did not tolerate change in ICD settings for MRI/MRA; Repeat CT on 11/12 no acute findings, prior remote R MCA infarct. - A1c 4.6%, LDL 63, HDL 26, Cholesterol 103, Triglycerides 68 - Atrovastatin dose increased from home dose.  - No change  3.  Persistent Afib with RVR - In 5/21 had refractory AF requiring multiple DC-CVs,  high-dose amio and ranexa.  -He reported intermittent compliance with amio and Eliquis. Out of eliquis prior to admission. Transitioned from eliquis to heparin gtt due to possible need for procedures on 11/9 and planned for TEE DC-CV was scheduled 11/10, however canceled due to new neuro symptoms. - Stopped ranexa on 11/9. - DCCV 11/17 converted to SR. - Remains in NSR. Continue with amiodarone 30 gtt while on DBA. - Continue with eliquis 5 mg BID. No bleeding.    4. Acute on CKD IIIb - Baseline Cr looks to be around 1.6 to 1.7 - Admission Cr 1.69 and Peak Cr 2.9 -Today creatinine stablt at 2.77 - Likely ATN/cardiorenal - Continue to monitor BMET daily   5. Elevated troponin - 10/2018 cath clean coronaries - elevated trops due to afib with RVR, CHF - Episode of chest pain and shortness of breath that resolved last night.  EKG obtained no ST changes.   6. Chronic cholecystitis with perc drain -  Per primary team. Perc drain now fell out -  Will follow up outpatient   7. Iron deficiency anemia - Iron sats low and has received feraheme. - Hgb stable   8. Depression - Watch QT closely on amio.  - Stopped zoloft & atarax 11/10 d/t prolong QT.   -  Ranexa stopped 11/9.  -EKG 11/19 QTc 504   Glori Bickers, MD  10:59 AM

## 2020-01-10 NOTE — Plan of Care (Signed)
  Problem: Cardiac: Goal: Ability to achieve and maintain adequate cardiopulmonary perfusion will improve Outcome: Progressing   Problem: Health Behavior/Discharge Planning: Goal: Ability to safely manage health-related needs after discharge will improve Outcome: Progressing   Problem: Pain Managment: Goal: General experience of comfort will improve Outcome: Progressing

## 2020-01-10 NOTE — Plan of Care (Signed)
  Problem: Education: Goal: Knowledge of disease or condition will improve Outcome: Progressing Goal: Understanding of medication regimen will improve Outcome: Progressing Goal: Individualized Educational Video(s) Outcome: Progressing   Problem: Activity: Goal: Ability to tolerate increased activity will improve Outcome: Progressing   

## 2020-01-11 DIAGNOSIS — I4819 Other persistent atrial fibrillation: Secondary | ICD-10-CM | POA: Diagnosis not present

## 2020-01-11 LAB — BASIC METABOLIC PANEL
Anion gap: 10 (ref 5–15)
BUN: 37 mg/dL — ABNORMAL HIGH (ref 8–23)
CO2: 29 mmol/L (ref 22–32)
Calcium: 9.3 mg/dL (ref 8.9–10.3)
Chloride: 96 mmol/L — ABNORMAL LOW (ref 98–111)
Creatinine, Ser: 2.9 mg/dL — ABNORMAL HIGH (ref 0.61–1.24)
GFR, Estimated: 23 mL/min — ABNORMAL LOW (ref >60)
Glucose, Bld: 108 mg/dL — ABNORMAL HIGH (ref 70–99)
Potassium: 4.1 mmol/L (ref 3.5–5.1)
Sodium: 135 mmol/L (ref 135–145)

## 2020-01-11 LAB — GLUCOSE, CAPILLARY
Glucose-Capillary: 96 mg/dL (ref 70–99)
Glucose-Capillary: 120 mg/dL — ABNORMAL HIGH (ref 70–99)
Glucose-Capillary: 71 mg/dL (ref 70–99)
Glucose-Capillary: 107 mg/dL — ABNORMAL HIGH (ref 70–99)

## 2020-01-11 LAB — COOXEMETRY PANEL
Carboxyhemoglobin: 1.7 % — ABNORMAL HIGH (ref 0.5–1.5)
Carboxyhemoglobin: 1.2 % (ref 0.5–1.5)
Methemoglobin: 0.6 % (ref 0.0–1.5)
Methemoglobin: 0.7 % (ref 0.0–1.5)
O2 Saturation: 48.9 %
O2 Saturation: 48 %
Total hemoglobin: 8.7 g/dL — ABNORMAL LOW (ref 12.0–16.0)
Total hemoglobin: 16.7 g/dL — ABNORMAL HIGH (ref 12.0–16.0)

## 2020-01-11 LAB — MAGNESIUM: Magnesium: 2.1 mg/dL (ref 1.7–2.4)

## 2020-01-11 MED ORDER — FUROSEMIDE 10 MG/ML IJ SOLN: 80.0000 mg | Freq: Every day | INTRAMUSCULAR | Status: DC

## 2020-01-11 NOTE — Progress Notes (Signed)
Occupational Therapy Treatment Patient Details Name: Glenn [name] MRN: [id number] DOB: 1950-08-10 Today's Date: 01/11/2020    History of present illness Glenn [name] is a 69 y.o. male with a hx of chronic systolic HF 2/2 NICM s/p BSci ICD, atrial fibrillation, h/o stroke (Lg L MCA infarct w/ hemorrhagic conversion in 2018), h/o DVT, chornic a/c therapy, HTN, HLD, pulmonary nodule, Hepatitis C, carpal tunnel,  tobacco abuse (smokes ~1/4 ppd) and chronic cholecystitis with a chronic perc drain, who is being seen today for the evaluation of Afib RVR and CHF at the request of Dr Regenia Skeeter. Pt experienced onset of L side weakness on 12/30/19, CT negative for acute changes, pt cannot have an MRI.   OT comments  Pt received in bed, agreeable to OT session. Pt currently requires supervision for toilet transfer and ambulation at RW level, he requires minguard for completion of grooming while standing minor instability without BUE support, no LOB noted. Pt requesting to go for walk in the hallway, ambulated about 100 feet with supervision-minguard assistance. Pt demonstrates steady progression in activity tolerance, continued to educate pt on energy conservation strategies with ADL/IADL and functional mobility completion. Pt will continue to benefit from skilled OT services to maximize safety and independence with ADL/IADL and functional mobility. Will continue to follow acutely and progress as tolerated.    Follow Up Recommendations  Home health OT;Supervision - Intermittent    Equipment Recommendations  None recommended by OT    Recommendations for Other Services      Precautions / Restrictions Precautions Precautions: Fall Precaution Comments: watch HR Restrictions Weight Bearing Restrictions: No       Mobility Bed Mobility Overal bed mobility: Modified Independent                Transfers Overall transfer level: Needs assistance Equipment used: Rolling walker (2  wheeled) Transfers: Sit to/from Stand Sit to Stand: Supervision         General transfer comment: for safety and lines    Balance Overall balance assessment: Needs assistance Sitting-balance support: Feet supported;No upper extremity supported Sitting balance-Leahy Scale: Good Sitting balance - Comments: Pt able to sit EOB without UE support   Standing balance support: During functional activity Standing balance-Leahy Scale: Fair Standing balance comment: able to stand static without UE support, at least single UE support when reaching outside BOS                           ADL either performed or assessed with clinical judgement   ADL Overall ADL's : Needs assistance/impaired Eating/Feeding: Independent   Grooming: Min guard;Standing               Lower Body Dressing: Sit to/from stand;Supervision/safety   Toilet Transfer: Banker Details (indicate cue type and reason): ambulated in hallway Toileting- Clothing Manipulation and Hygiene: Sit to/from stand;Supervision/safety       Functional mobility during ADLs: Rolling walker;Supervision/safety General ADL Comments: supervision for safety with line management;continued to educate pt on energy conservation strategies with ADL completion     Vision   Vision Assessment?: No apparent visual deficits   Perception     Praxis      Cognition Arousal/Alertness: Awake/alert Behavior During Therapy: WFL for tasks assessed/performed Overall Cognitive Status: Within Functional Limits for tasks assessed  General Comments: A/O x4        Exercises     Shoulder Instructions       General Comments HR up to 91bpm with ambulation the hallway    Pertinent Vitals/ Pain       Pain Assessment: No/denies pain Pain Intervention(s): Monitored during session  Home Living                                           Prior Functioning/Environment              Frequency  Min 2X/week        Progress Toward Goals  OT Goals(current goals can now be found in the care plan section)  Progress towards OT goals: Progressing toward goals  Acute Rehab OT Goals Patient Stated Goal: return home OT Goal Formulation: With patient Time For Goal Achievement: 01/15/20 Potential to Achieve Goals: Good ADL Goals Pt Will Perform Grooming: with supervision;standing Pt Will Perform Lower Body Bathing: with supervision;sit to/from stand Pt Will Perform Lower Body Dressing: with supervision;sit to/from stand Pt Will Transfer to Toilet: with supervision;ambulating;regular height toilet Pt Will Perform Toileting - Clothing Manipulation and hygiene: with supervision;sit to/from stand Pt Will Perform Tub/Shower Transfer: Tub transfer;with supervision;ambulating;3 in 1 Pt/caregiver will Perform Home Exercise Program: Increased strength;Left upper extremity;Independently;With theraband  Plan Discharge plan remains appropriate    Co-evaluation                 AM-PAC OT "6 Clicks" Daily Activity     Outcome Measure   Help from another person eating meals?: None Help from another person taking care of personal grooming?: A Little Help from another person toileting, which includes using toliet, bedpan, or urinal?: A Little Help from another person bathing (including washing, rinsing, drying)?: A Little Help from another person to put on and taking off regular upper body clothing?: A Little Help from another person to put on and taking off regular lower body clothing?: A Little 6 Click Score: 19    End of Session Equipment Utilized During Treatment: Rolling walker;Gait belt  OT Visit Diagnosis: Other abnormalities of gait and mobility (R26.89);Unsteadiness on feet (R26.81);Muscle weakness (generalized) (M62.81);Hemiplegia and hemiparesis Hemiplegia - Right/Left: Left Hemiplegia -  dominant/non-dominant: Non-Dominant Hemiplegia - caused by: Cerebral infarction   Activity Tolerance Patient tolerated treatment well   Patient Left with call bell/phone within reach;in chair;Other (comment) (with staff present)   Nurse Communication Mobility status        Time: [phone] OT Time Calculation (min): 17 min  Charges: OT General Charges $OT Visit: 1 Visit OT Treatments $Self Care/Home Management : 8-22 mins  [name] Kelp OTR/L Acute Rehabilitation Services Office: [place] 01/11/2020, 2:52 PM

## 2020-01-11 NOTE — Progress Notes (Addendum)
Patient ID: Glenn [name], male   DOB: 04/24/1950, 69 y.o.   MRN: [id number]     Advanced Heart Failure Rounding Note  PCP-Cardiologist: Virl Axe, MD   Subjective:    PICC placed 11/6 with initial CO-OX 31% and started Milrinone 0.25. On 11/8 txf ICU due to Co-ox remaining 30-40% on milrinone & started NE.  11/10: Code Stroke for L facial droop w/ slurred speech. Ultimately switch off milrinone and placed on DBA for persistently low co-ox.  Off NE 11/11. DC-CV 11/6.   Yesterday:  Co-ox  52%, CVP elevated at 20 on dobutamine 2.   Today: Co-ox 49%, CVP 15 remains on dobutamine 2, lasix 80 BID IV.  Denies chest pain, shortness of breath.  Reports urine not being recorded.    Objective:   Weight Range: 74.4 kg Body mass index is 25.69 kg/m.   Vital Signs:   Temp:  [97.5 F (36.4 C)-98.7 F (37.1 C)] 98.3 F (36.8 C) (11/22 0407) Pulse Rate:  [66-81] 66 (11/22 0407) Resp:  [14-24] 19 (11/22 0407) BP: (90-124)/(50-75) 111/59 (11/22 0407) SpO2:  [93 %-98 %] 93 % (11/22 0407) Weight:  [74.4 kg] 74.4 kg (11/22 0407) Last BM Date: 01/06/20  Weight change: Filed Weights   01/09/20 0429 01/10/20 0346 01/11/20 0407  Weight: 75.6 kg 75.3 kg 74.4 kg    Intake/Output:   Intake/Output Summary (Last 24 hours) at 01/11/2020 0639 Last data filed at 01/11/2020 0530 Gross per 24 hour  Intake 1200 ml  Output 1450 ml  Net -250 ml      Physical Exam   CVP 15 General:  Well appearing. No resp difficulty HEENT: normal Neck: supple. + JVD. Carotids 2+ bilat; no bruits. No lymphadenopathy or thryomegaly appreciated. Cor: PMI nondisplaced. Regular rate & rhythm. No rubs, or gallops.  Positive murmurs. Lungs: clear  Abdomen: soft, nontender, nondistended. No hepatosplenomegaly. No bruits or masses. Good bowel sounds. Extremities: no cyanosis, clubbing, rash, edema Neuro: alert & oriented x 3, cranial nerves grossly intact. moves all 4 extremities w/o difficulty. Affect  pleasant    Telemetry   1st AVB, PR 22, rates 60-80s.  Personally reviewed.   Labs    CBC No results for input(s): WBC, NEUTROABS, HGB, HCT, MCV, PLT in the last 72 hours. Basic Metabolic Panel Recent Labs    01/10/20 0455 01/11/20 0416  NA 135 135  K 3.9 4.1  CL 95* 96*  CO2 29 29  GLUCOSE 155* 108*  BUN 36* 37*  CREATININE 2.77* 2.90*  CALCIUM 8.8* 9.3  MG 2.3 2.1   Liver Function Tests No results for input(s): AST, ALT, ALKPHOS, BILITOT, PROT, ALBUMIN in the last 72 hours. No results for input(s): LIPASE, AMYLASE in the last 72 hours. Cardiac Enzymes No results for input(s): CKTOTAL, CKMB, CKMBINDEX, TROPONINI in the last 72 hours.  BNP: BNP (last 3 results) Recent Labs    06/01/19 1413 06/23/19 1527 12/25/19 1757  BNP 531.4* 613.2* 776.7*    ProBNP (last 3 results) No results for input(s): PROBNP in the last 8760 hours.   D-Dimer No results for input(s): DDIMER in the last 72 hours. Hemoglobin A1C No results for input(s): HGBA1C in the last 72 hours. Fasting Lipid Panel No results for input(s): CHOL, HDL, LDLCALC, TRIG, CHOLHDL, LDLDIRECT in the last 72 hours. Thyroid Function Tests No results for input(s): TSH, T4TOTAL, T3FREE, THYROIDAB in the last 72 hours.  Invalid input(s): FREET3  Other results:   Imaging    . No results found.  Medications:     Scheduled Medications: . . . apixaban .  5 mg . Oral . BID  . Marland Kitchen Marland Kitchen atorvastatin .  40 mg . Oral . QPM  . . . Chlorhexidine Gluconate Cloth .  6 each . Topical . Daily  . . . docusate sodium .  100 mg . Oral . BID  . Marland Kitchen . feeding supplement .  237 mL . Oral . TID BM  . . . furosemide .  80 mg . Intravenous . BID  . . . gabapentin .  300 mg . Oral . BID  . Marland Kitchen . insulin aspart .  0-9 Units . Subcutaneous . TID WC  . Marland Kitchen . melatonin .  3 mg . Oral . QHS  . Marland Kitchen . pantoprazole .  40 mg . Oral . Daily  . . . sodium chloride flush .  10-40 mL . Intracatheter . Q12H  .   Infusions: . .  . amiodarone . 30 mg/hr (01/10/20 2152)  . . . DOBUTamine . 2 mcg/kg/min (01/09/20 1746)  . . . norepinephrine (LEVOPHED) Adult infusion . Stopped (01/03/20 1615)  .   PRN Medications: . acetaminophen, ALPRAZolam, calcium carbonate, ondansetron (ZOFRAN) IV, polyethylene glycol, sodium chloride flush    Patient Profile  69 y.o. male with a hx of chronic systolic HF 2/2 NICM s/p BSci ICD, persistent atrial fibrillation, h/o stroke (Lg L MCA infarct w/ hemorrhagic conversion in 2018), HTN, HCV, tobacco abuse (smokes ~1/4 ppd),  and chronic cholecystitis with a chronic perc drain.   Admitted with ADHF. Started on milrinone>> later transitioned to DBA for persistently low co-ox.    Assessment/Plan   1. Acute on chronic systolic biventricular  HF due to NICM -> cardiogenic shock - suspect AF cardiomyopathy - ICD placed Nov 2015 - [facility] 10/2018 showed normal cors, elevated filling pressures and low CI at 1.9  - TTE 11/2018 LVEF 20%, mild RV dysfunction - Admit 06/2019 with severe HF, required IABP and milrinone. TEE 5/21 EF 20-25% with moderate to severe RV dysfunction.  - Admitted 12/25/2019 with ADHF. Milrinone 0.25 mcg started 11/7 due to low output HF. Echo 12/28/19 EF 20-25% RV mildly decreased. Moved to ICU on 11/8 due to persistently low co-ox on milrinone. 12/29/19: Continued w/ persistently low co-ox despite milrinone 0.5 and NE which was stopped d/t elevated MAPs. Gave metolazone 2/2 low UOP. Transitioned to dobutamine gtt starting at 2.5 mcg then increased to 3 mcg. Required NE but has been off since 11/11.  - Suspect RV function worse than it looks on echo.  - CVP 15, co-ox 49%, positive JVD - CVP goal around 10 with RV failure.  - Increase dobutamine to 3. Continue w/ IV lasix but decreased from BID to Daily . - No beta blockers due to low output HF - No dig or ACE/ARB/ARNI with worsening renal function.  - Options remain very limited - poor LVAD candidate with noncompliance and renal  failure. He did not make one visit to HF or EP clinic after dramatic hospitalization for AF and cardiogenic shock in 5/21 - Main issue seems to be RV failure. Hard to know what endpoint will be a this time. Hopefully will have some EF improvement with maintaining NSR.    2. Concerning for acute ischemic R MCA -hx of large R MCA with hemorrhagic conversion in Sept 2020 & L MCA s/p thrombectomy M2 occlusion in 2018.  -11/10 around 14:00 developed new L facial droop, LUE weakness and slurred  speech - Code Stroke called.  Neuro recs appreciated, did not tolerate change in ICD settings for MRI/MRA; Repeat CT on 11/12 no acute findings, prior remote R MCA infarct. - A1c 4.6%, LDL 63, HDL 26, Cholesterol 103, Triglycerides 68 - Atrovastatin dose increased from home dose.  - No change  3.  Persistent Afib with RVR - In 5/21 had refractory AF requiring multiple DC-CVs, high-dose amio and ranexa.  -He reported intermittent compliance with amio and Eliquis. Out of eliquis prior to admission. Transitioned from eliquis to heparin gtt due to possible need for procedures on 11/9 and planned for TEE DC-CV was scheduled 11/10, however canceled due to new neuro symptoms. - Stopped ranexa on 11/9. - DCCV 11/17 converted to SR. - Remains in NSR.  - Continue with amiodarone 30 gtt while on DBA. - Continue with eliquis 5 mg BID.  - No bleeding.    4. Acute on CKD IIIb - Baseline Cr looks to be around 1.6 to 1.7 - Admission Cr 1.69 and Peak Cr 2.9 -Today creatinine 2.9 - Likely ATN/cardiorenal - Continue to monitor BMET daily   5. Elevated troponin - 10/2018 cath clean coronaries - elevated trops due to afib with RVR, CHF - Episode of chest pain and shortness of breath that resolved last night.  EKG obtained no ST changes.    6. Chronic cholecystitis with perc drain -  Per primary team. Perc drain now fell out -  Will follow up outpatient    7. Iron deficiency anemia - Iron sats low and has received  feraheme. - Hgb stable    8. Depression - Watch QT closely on amio.  - Stopped zoloft & atarax 11/10 d/t prolong QT.   - Ranexa stopped 11/9.  -EKG 11/19 QTc 504   Carlene Coria, NP  6:39 AM  Patient seen and examined with the above-signed Advanced Practice Provider and/or Housestaff. I personally reviewed laboratory data, imaging studies and relevant notes. I independently examined the patient and formulated the important aspects of the plan. I have edited the note to reflect any of my changes or salient points. I have personally discussed the plan with the patient and/or family.  Says he feels ok. Denies SOB, orthopnea or PND. However co-ox remains low on dobutamine and CVP still at 15.   General:  Lying in bed No resp difficulty HEENT: normal Neck: supple. JVP to ear  Carotids 2+ bilat; no bruits. No lymphadenopathy or thryomegaly appreciated. Cor: PMI nondisplaced. Regular rate & rhythm. +s3 and RV lift Lungs: clear Abdomen: soft, nontender, nondistended. No hepatosplenomegaly. No bruits or masses. Good bowel sounds. Extremities: no cyanosis, clubbing, rash, edema Neuro: alert & orientedx3, cranial nerves grossly intact. moves all 4 extremities w/o difficulty. Affect pleasant  Remains in NSR but still inotrope dependent. CVP up. Co-ox low. Creatinine worsening. Will increase dobutamine. Continue IV lasix. May warrant full RHC later this week. Will likely have to plan on home dobutamine.    Glori Bickers, MD  1:16 PM

## 2020-01-11 NOTE — Progress Notes (Addendum)
PT Cancellation Note  Patient Details Name: Glenn [name] MRN: [id number] DOB: 1950-05-23   Cancelled Treatment:    Reason Eval/Treat Not Completed: Other (comment)  Pt with other staff and reports just ambulated.  Will f/u as able. Checked on pt at 1430.  Tried again 1653 and pt declined due to dinner arrived. Request PT tomorrow.   Abran Richard, PT Acute Rehab Services Pager [phone] [facility] [place] 01/11/2020, 3:52 PM

## 2020-01-12 DIAGNOSIS — I4819 Other persistent atrial fibrillation: Secondary | ICD-10-CM | POA: Diagnosis not present

## 2020-01-12 LAB — GLUCOSE, CAPILLARY
Glucose-Capillary: 95 mg/dL (ref 70–99)
Glucose-Capillary: 137 mg/dL — ABNORMAL HIGH (ref 70–99)
Glucose-Capillary: 111 mg/dL — ABNORMAL HIGH (ref 70–99)
Glucose-Capillary: 184 mg/dL — ABNORMAL HIGH (ref 70–99)

## 2020-01-12 LAB — BASIC METABOLIC PANEL
Anion gap: 11 (ref 5–15)
BUN: 32 mg/dL — ABNORMAL HIGH (ref 8–23)
CO2: 27 mmol/L (ref 22–32)
Calcium: 9 mg/dL (ref 8.9–10.3)
Chloride: 96 mmol/L — ABNORMAL LOW (ref 98–111)
Creatinine, Ser: 2.56 mg/dL — ABNORMAL HIGH (ref 0.61–1.24)
GFR, Estimated: 27 mL/min — ABNORMAL LOW (ref >60)
Glucose, Bld: 121 mg/dL — ABNORMAL HIGH (ref 70–99)
Potassium: 3.7 mmol/L (ref 3.5–5.1)
Sodium: 134 mmol/L — ABNORMAL LOW (ref 135–145)

## 2020-01-12 LAB — CBC
HCT: 26.3 % — ABNORMAL LOW (ref 39.0–52.0)
Hemoglobin: 8.3 g/dL — ABNORMAL LOW (ref 13.0–17.0)
MCH: 29.5 pg (ref 26.0–34.0)
MCHC: 31.6 g/dL (ref 30.0–36.0)
MCV: 93.6 fL (ref 80.0–100.0)
Platelets: 171 10*3/uL (ref 150–400)
RBC: 2.81 MIL/uL — ABNORMAL LOW (ref 4.22–5.81)
RDW: 18.4 % — ABNORMAL HIGH (ref 11.5–15.5)
WBC: 4.7 10*3/uL (ref 4.0–10.5)
nRBC: 0 % (ref 0.0–0.2)

## 2020-01-12 LAB — COOXEMETRY PANEL
Carboxyhemoglobin: 1.7 % — ABNORMAL HIGH (ref 0.5–1.5)
Methemoglobin: 0.5 % (ref 0.0–1.5)
O2 Saturation: 64.5 %
Total hemoglobin: 13.9 g/dL (ref 12.0–16.0)

## 2020-01-12 MED ORDER — SILVER NITRATE-POT NITRATE 75-25 % EX MISC
1.0000 | Freq: Once | CUTANEOUS | Status: AC
  Administered 2020-01-12: 1 via TOPICAL
  Filled 2020-01-12: qty 1

## 2020-01-12 MED ORDER — AMIODARONE HCL 200 MG PO TABS
200.0000 mg | ORAL_TABLET | Freq: Two times a day (BID) | ORAL | Status: DC
  Administered 2020-01-12 – 2020-01-18 (×13): 200 mg via ORAL
  Filled 2020-01-12 (×13): qty 1

## 2020-01-12 MED ORDER — SODIUM CHLORIDE 0.9% FLUSH: 3.0000 mL | INTRAVENOUS | Status: DC | PRN

## 2020-01-12 MED ORDER — SODIUM CHLORIDE 0.9 % IV SOLN: 250.0000 mL | INTRAVENOUS | Status: DC | PRN

## 2020-01-12 MED ORDER — ASPIRIN 81 MG PO CHEW
81.0000 mg | CHEWABLE_TABLET | ORAL | Status: AC
  Administered 2020-01-13: 81 mg via ORAL
  Filled 2020-01-12: qty 1

## 2020-01-12 MED ORDER — FUROSEMIDE 10 MG/ML IJ SOLN
80.0000 mg | Freq: Every day | INTRAMUSCULAR | Status: DC
  Administered 2020-01-12: 80 mg via INTRAVENOUS
  Filled 2020-01-12: qty 8

## 2020-01-12 MED ORDER — SODIUM CHLORIDE 0.9 % IV SOLN: INTRAVENOUS | Status: DC

## 2020-01-12 MED ORDER — SODIUM CHLORIDE 0.9% FLUSH
3.0000 mL | Freq: Two times a day (BID) | INTRAVENOUS | Status: DC
  Administered 2020-01-12 – 2020-01-17 (×8): 3 mL via INTRAVENOUS

## 2020-01-12 NOTE — Care Management Important Message (Signed)
Important Message  Patient Details  Name: Glenn [name] MRN: [id number] Date of Birth: 1950/05/22   Medicare Important Message Given:  Yes     [name] [address] 01/12/2020, 3:47 PM

## 2020-01-12 NOTE — Progress Notes (Signed)
  Speech Language Pathology Treatment: Cognitive-Linquistic  Patient Details Name: Glenn [name] MRN: [id number] DOB: 1950/04/12 Today's Date: 01/12/2020 Time: 2563-8937 SLP Time Calculation (min) (ACUTE ONLY): 25 min  Assessment / Plan / Recommendation Clinical Impression  Patient seen to address cognitive and speech goals. Patient was 90% intelligible at conversational level but did exhibit mild left sided air escapage with plosives and had intermittent phoneme repetition of stop consonants. Patient has awareness to this and he did inform SLP that this happens more when he is tired. SLP noticed that patient had chess board in room and he said that he has been playing for about 15 years, but his eye sight has been declining so it is harder for him to do so. SLP brought chess board to bed and he and patient started a game. Initially, patient was not aware of when he would make an error in movement of piece (seemed mainly due to visual impairment), but as game continued, he started to demonstrate more awareness to his errors. He was able to verbalize a couple of the memory strategies that previous SLP had talked about with him and stated they have been helpful to him. ]  HPI HPI: Glenn [name] is a 69 y.o. male with history of previous strokes with resultant LLE weakness who developed worsening recurrent L sided deficits which are improving to baseline. Possible infarct; CT negative, . He developed  Slurred speech and left sided weakness while Eliquis was held for elective cardioversion and he was on IV heparin due to subtherapeutic level.  PMHx: hemorrhagic stroke with left weakness, PAF, Ischemic cardiomyopathy, DVT, HTN, HLD. Pt was last seen by SLP 12/18/18 with recommendation of regular texture solids and thin liquids      SLP Plan  Continue with current plan of care       Recommendations   CIR                Follow up Recommendations: Inpatient Rehab SLP Visit  Diagnosis: Dysarthria and anarthria (R47.1);Cognitive communication deficit (R41.841) Plan: Continue with current plan of care       [place], MA, [facility]

## 2020-01-12 NOTE — Consult Note (Addendum)
Glenn [name] 1950-06-06  [id number].    Requesting MD: Dr. Haroldine Laws Chief Complaint/Reason for Consult: Hx of Perc Chole drain. Drain issues  HPI: Glenn [name] is a 69 y.o. male with a hx of HTN, CHF 2/2 NICM w/ AICD. Hep C, prior CVA, presence of IVC filter (12/04/18), A. Fib on Eliquis, and CKD who presented with shortness of breath and dizziness and was admitted with A. fib with RVR and CHF exacerbation on 11/5.   Patient was previously admitted on 12/12/2018 and was found to have acute cholecystitis.  IR placed a PERC cholecystostomy tube on 12/13/2018. During patients last drain exchange on 09/23/2019 the gallbladder was significantly contracted and scarred, and a 10 French drain would not form within the gallbladder.  We were called back today to see patient given drainage present from prior [facility] cholecystostomy tube.  Patient currently denies any abdominal pain, nausea or vomiting.  He is tolerating a heart healthy diet.  No recent LFTs in the last 1 week.  No imaging of gallbladder during admission.  WBC 4.7.  Patient afebrile.  It appears patient has never followed up with [place] in the office and missed his most recent reschedule appointment with Dr. Zenia Resides earlier this month because he was in the hospital.  ROS: Review of Systems  Constitutional: Negative for chills and fever.  Respiratory: Negative for cough.   Cardiovascular: Negative for chest pain and leg swelling.  Gastrointestinal: Negative for abdominal pain, diarrhea, nausea and vomiting.  Genitourinary: Negative for dysuria.  Skin: Negative for rash.  All other systems reviewed and are negative.   Family History  Problem Relation Age of Onset  . High blood pressure Mother   . High blood pressure Father   . Stroke Maternal Aunt   . Heart disease Neg Hx     Past Medical History:  Diagnosis Date  . Acute blood loss anemia   . Acute CVA (cerebrovascular accident) ([place])   . Acute deep vein thrombosis  (DVT) of both lower extremities ([name])   . Acute kidney injury ([place])   . Acute on chronic combined systolic and diastolic CHF (congestive heart failure) ([place])   . Acute renal failure superimposed on stage 3a chronic kidney disease ([place])   . Acute respiratory failure with hypoxia ([place])   . Atrial fibrillation ([place])   . Carpal tunnel syndrome of right wrist 02/28/2018  . Cerebral edema ([place]) 11/13/2018  . Cerebral infarction ([place])   . CHF (congestive heart failure) ([place])   . Cholecystitis 02/04/2019  . Chronic right hip pain   . DCM (dilated cardiomyopathy) ([place])   . Dysphagia, post-stroke   . Elevated troponin   . Entrapment of right ulnar nerve 02/28/2018  . Headache due to intracranial disease 11/14/2018  . Hepatitis C   . History of hemorrhagic stroke with residual hemiparesis ([place]) 02/04/2019  . HTN (hypertension) 08/14/2016  . Hyperlipidemia LDL goal <70 11/13/2018  . Hypertension   . ICD (implantable cardioverter-defibrillator) in place 09/13/2016  . Impotence due to erectile dysfunction 09/30/2017  . Ischemic cardiomyopathy   . Labile blood glucose   . Left leg DVT ([place]) 02/04/2019  . Marijuana user 11/13/2018  . Paroxysmal atrial fibrillation ([name])   . Right middle cerebral artery stroke ([place]) 11/13/2018  . Solitary pulmonary nodule 06/10/2017   5 mm RUL nodule noted incidentally as part of CVA workup 08/2016. With smoking history would obtain low-dose CT scan 08/2017.   . Stroke (cerebrum) ([place]) Lg L MCA  infarct w/ hemorrhagic conversion, embolic d/t AF 2/58/5277  . Stroke ([place])   . Trochanteric bursitis, right hip 11/14/2018  . Visit for monitoring Tikosyn therapy 03/26/2017    Past Surgical History:  Procedure Laterality Date  . CARDIAC DEFIBRILLATOR PLACEMENT  2015  . CARDIOVERSION N/A 10/10/2016   Procedure: CARDIOVERSION;  Surgeon: Dorothy Spark, MD;  Location: [place];  Service: Cardiovascular;  Laterality: N/A;  . CARDIOVERSION N/A 03/27/2017   Procedure:  CARDIOVERSION;  Surgeon: Jerline Pain, MD;  Location: [place];  Service: Cardiovascular;  Laterality: N/A;  . CARDIOVERSION N/A 10/29/2018   Procedure: CARDIOVERSION;  Surgeon: Sanda Klein, MD;  Location: [place];  Service: Cardiovascular;  Laterality: N/A;  . CARDIOVERSION N/A 11/05/2018   Procedure: CARDIOVERSION;  Surgeon: Acie Fredrickson Wonda Cheng, MD;  Location: [place];  Service: Cardiovascular;  Laterality: N/A;  . CARDIOVERSION N/A 07/02/2019   Procedure: CARDIOVERSION;  Surgeon: Jolaine Artist, MD;  Location: [place];  Service: Cardiovascular;  Laterality: N/A;  . CARDIOVERSION N/A 07/14/2019   Procedure: CARDIOVERSION;  Surgeon: Jolaine Artist, MD;  Location: [facility] ENDOSCOPY;  Service: Cardiovascular;  Laterality: N/A;  . CARDIOVERSION N/A 12/30/2019   Procedure: CARDIOVERSION;  Surgeon: Jolaine Artist, MD;  Location: [place];  Service: Cardiovascular;  Laterality: N/A;  . CARDIOVERSION N/A 01/05/2020   Procedure: BEDSIDE CARDIOVERSION;  Surgeon: Jolaine Artist, MD;  Location: [place];  Service: Cardiovascular;  Laterality: N/A;  . EYE SURGERY Left 1990  . IABP INSERTION N/A 06/26/2019   Procedure: IABP INSERTION;  Surgeon: Jolaine Artist, MD;  Location: [place] CV LAB;  Service: Cardiovascular;  Laterality: N/A;  . IR CATHETER TUBE CHANGE  06/24/2019  . IR EXCHANGE BILIARY DRAIN  02/11/2019  . IR EXCHANGE BILIARY DRAIN  06/09/2019  . IR EXCHANGE BILIARY DRAIN  09/23/2019  . IR IVC FILTER PLMT / S&I /IMG GUID/MOD SED  12/04/2018  . IR PERC CHOLECYSTOSTOMY  12/13/2018  . IR PERCUTANEOUS ART THROMBECTOMY/INFUSION INTRACRANIAL INC DIAG ANGIO  09/05/2016  . IR RADIOLOGIST EVAL & MGMT  10/03/2016  . RADIOLOGY WITH ANESTHESIA N/A 09/05/2016   Procedure: RADIOLOGY WITH ANESTHESIA;  Surgeon: Luanne Bras, MD;  Location: [place];  Service: Radiology;  Laterality: N/A;  . RIGHT HEART CATH N/A 06/24/2019   Procedure: RIGHT HEART CATH;  Surgeon: Jolaine Artist,  MD;  Location: [place] CV LAB;  Service: Cardiovascular;  Laterality: N/A;  . RIGHT/LEFT HEART CATH AND CORONARY ANGIOGRAPHY N/A 11/03/2018   Procedure: RIGHT/LEFT HEART CATH AND CORONARY ANGIOGRAPHY;  Surgeon: Lorretta Harp, MD;  Location: [place] CV LAB;  Service: Cardiovascular;  Laterality: N/A;  . TEE WITHOUT CARDIOVERSION N/A 02/06/2019   Procedure: TRANSESOPHAGEAL ECHOCARDIOGRAM (TEE);  Surgeon: Skeet Latch, MD;  Location: [place];  Service: Cardiovascular;  Laterality: N/A;  . TEE WITHOUT CARDIOVERSION N/A 06/09/2019   Procedure: TRANSESOPHAGEAL ECHOCARDIOGRAM (TEE);  Surgeon: Buford Dresser, MD;  Location: [facility] ENDOSCOPY;  Service: Cardiovascular;  Laterality: N/A;  . TEE WITHOUT CARDIOVERSION N/A 12/30/2019   Procedure: TRANSESOPHAGEAL ECHOCARDIOGRAM (TEE);  Surgeon: Jolaine Artist, MD;  Location: [facility] OR;  Service: Cardiovascular;  Laterality: N/A;    Social History:  reports that he has quit smoking. His smoking use included cigarettes. He smoked 0.50 packs per day. He has never used smokeless tobacco. He reports previous alcohol use of about 3.0 standard drinks of alcohol per week. He reports previous drug use. Frequency: 2.00 times per week. Drug: Marijuana.  Allergies:  Allergies  Allergen Reactions  .  Benadryl [Diphenhydramine] Palpitations    Medications Prior to Admission  Medication Sig Dispense Refill  . acetaminophen (TYLENOL) 325 MG tablet Take 2 tablets (650 mg total) by mouth every 6 (six) hours as needed for mild pain (or Fever >/= 101).    Marland Kitchen apixaban (ELIQUIS) 5 MG TABS tablet Take 1 tablet (5 mg total) by mouth 2 (two) times daily. 180 tablet 1  . atorvastatin (LIPITOR) 20 MG tablet TAKE 1 TABLET(20 MG) BY MOUTH DAILY AT 6 PM (Patient taking differently: Take 20 mg by mouth every evening. ) 90 tablet 0  . calcium carbonate (TUMS - DOSED IN MG ELEMENTAL CALCIUM) 500 MG chewable tablet Chew 1 tablet (200 mg of elemental calcium total) by  mouth daily as needed for indigestion or heartburn. 30 tablet 1  . diazepam (VALIUM) 5 MG tablet Take 2.5-5 mg by mouth 3 (three) times daily as needed for anxiety.     . digoxin (LANOXIN) 0.125 MG tablet Take 1 tablet (0.125 mg total) by mouth daily. 90 tablet 3  . docusate sodium (COLACE) 100 MG capsule Take 1 capsule (100 mg total) by mouth 2 (two) times daily. 10 capsule 0  . furosemide (LASIX) 40 MG tablet Take 40 mg by mouth daily as needed for fluid or edema.     . gabapentin (NEURONTIN) 300 MG capsule Take 2 capsules (600 mg total) by mouth 3 (three) times daily. (Patient taking differently: Take 300 mg by mouth 2 (two) times daily. ) 90 capsule 0  . ondansetron (ZOFRAN) 4 MG tablet Take 1 tablet (4 mg total) by mouth every 6 (six) hours as needed for nausea. 20 tablet 0  . pantoprazole (PROTONIX) 40 MG tablet Take 1 tablet (40 mg total) by mouth daily.    . potassium chloride SA (KLOR-CON) 20 MEQ tablet TAKE 2 TABLETS BY MOUTH ONCE DAILY FOR LOW POTASSIUM LEVELS (Patient taking differently: Take 20 mEq by mouth daily. ) 180 tablet 2  . spironolactone (ALDACTONE) 25 MG tablet Take 25 mg by mouth daily.    Marland Kitchen amiodarone (PACERONE) 200 MG tablet Take 1 tablet (200 mg total) by mouth every 8 (eight) hours. (Patient not taking: Reported on 12/26/2019)    . levalbuterol (XOPENEX) 0.63 MG/3ML nebulizer solution Take 3 mLs (0.63 mg total) by nebulization every 6 (six) hours as needed for wheezing or shortness of breath. (Patient not taking: Reported on 12/26/2019) 3 mL 12  . metoCLOPramide (REGLAN) 5 MG tablet Take 1 tablet (5 mg total) by mouth 3 (three) times daily. (Patient not taking: Reported on 12/26/2019)    . ranolazine (RANEXA) 500 MG 12 hr tablet TAKE 1 TABLET BY MOUTH EVERY 12 HOURS FOR ATRIAL FIBRILLATION (Patient not taking: Reported on 12/26/2019) 180 tablet 2  . sertraline (ZOLOFT) 25 MG tablet Take 1 tablet (25 mg total) by mouth daily. (Patient not taking: Reported on 12/26/2019) 30 tablet  0  . torsemide (DEMADEX) 20 MG tablet Take 2 tablets (40 mg total) by mouth 2 (two) times daily. (Patient not taking: Reported on 12/26/2019)       Physical Exam: Blood pressure 127/68, pulse 75, temperature 98 F (36.7 C), temperature source Oral, resp. rate (!) 22, height 5' 7"  (1.702 m), weight 75.3 kg, SpO2 96 %. General: pleasant, WD/WN male who is laying in bed in NAD HEENT: head is normocephalic, atraumatic.  Sclera are noninjected.  PERRL.  Ears and nose without any masses or lesions.  Mouth is pink and moist. Dentition fair Heart: Irregular rhythm with  regular rate.  ICD present. + Murmur. Palpable pedal pulses bilaterally  Lungs: CTAB, no wheezes, rhonchi, or rales noted.  Respiratory effort nonlabored. On RA. Abd: Soft, NT/ND, +BS, no masses, hernias, or organomegaly. Prior perc chole drain site with hypergranulation tissue. There is some purulent drainage on dressing. I was not able to express drainage from the prior drain site. There is no surrounding erythema, heat, induration or fluctuance. See picture below. MS: no BUE/BLE edema, calves soft and nontender Skin: warm and dry with no masses, lesions, or rashes Psych: A&Ox3 with an appropriate affect Neuro: Left lower face asymmetry that he reports is baseline. CN's otherwise grossly intact. Equal strength in BUE/BLE bilaterally, normal speech, thought process intact. Gait no assessed.        Results for orders placed or performed during the hospital encounter of 12/25/19 (from the past 48 hour(s))  Glucose, capillary     Status: Abnormal   Collection Time: 01/10/20 11:23 AM  Result Value Ref Range   Glucose-Capillary 102 (H) 70 - 99 mg/dL    Comment: Glucose reference range applies only to samples taken after fasting for at least 8 hours.   Comment 1 Notify RN    Comment 2 Document in Chart   Sodium, urine, random     Status: None   Collection Time: 01/10/20 12:12 PM  Result Value Ref Range   Sodium, Ur 86 mmol/L     Comment: Performed at [facility] [address] [place], [place] [address]  Glucose, capillary     Status: Abnormal   Collection Time: 01/10/20  4:00 PM  Result Value Ref Range   Glucose-Capillary 122 (H) 70 - 99 mg/dL    Comment: Glucose reference range applies only to samples taken after fasting for at least 8 hours.   Comment 1 Notify RN    Comment 2 Document in Chart   Glucose, capillary     Status: Abnormal   Collection Time: 01/10/20  9:31 PM  Result Value Ref Range   Glucose-Capillary 121 (H) 70 - 99 mg/dL    Comment: Glucose reference range applies only to samples taken after fasting for at least 8 hours.  Cooxemetry Panel (carboxy, met, total hgb, O2 sat)     Status: Abnormal   Collection Time: 01/11/20  4:16 AM  Result Value Ref Range   Total hemoglobin 8.7 (L) 12.0 - 16.0 g/dL   O2 Saturation 48.9 %   Carboxyhemoglobin 1.7 (H) 0.5 - 1.5 %   Methemoglobin 0.6 0.0 - 1.5 %    Comment: Performed at [facility] [address], [place], [place] [address]  Basic metabolic panel     Status: Abnormal   Collection Time: 01/11/20  4:16 AM  Result Value Ref Range   Sodium 135 135 - 145 mmol/L   Potassium 4.1 3.5 - 5.1 mmol/L   Chloride 96 (L) 98 - 111 mmol/L   CO2 29 22 - 32 mmol/L   Glucose, Bld 108 (H) 70 - 99 mg/dL    Comment: Glucose reference range applies only to samples taken after fasting for at least 8 hours.   BUN 37 (H) 8 - 23 mg/dL   Creatinine, Ser 2.90 (H) 0.61 - 1.24 mg/dL   Calcium 9.3 8.9 - 10.3 mg/dL   GFR, Estimated 23 (L) >60 mL/min    Comment: (NOTE) Calculated using the CKD-EPI Creatinine Equation (2021)    Anion gap 10 5 - 15    Comment: Performed at [facility]  [facility], [place] [address] [place], [place] [address]  Magnesium     Status: None   Collection Time: 01/11/20  4:16 AM  Result Value Ref Range   Magnesium 2.1 1.7 - 2.4 mg/dL    Comment: Performed at [facility] [address], [place], [place] [address]  Glucose,  capillary     Status: None   Collection Time: 01/11/20  6:34 AM  Result Value Ref Range   Glucose-Capillary 96 70 - 99 mg/dL    Comment: Glucose reference range applies only to samples taken after fasting for at least 8 hours.  Glucose, capillary     Status: Abnormal   Collection Time: 01/11/20 11:20 AM  Result Value Ref Range   Glucose-Capillary 120 (H) 70 - 99 mg/dL    Comment: Glucose reference range applies only to samples taken after fasting for at least 8 hours.   Comment 1 Notify RN    Comment 2 Document in Chart   .Cooxemetry Panel (carboxy, met, total hgb, O2 sat)     Status: Abnormal   Collection Time: 01/11/20  1:45 PM  Result Value Ref Range   Total hemoglobin 16.7 (H) 12.0 - 16.0 g/dL   O2 Saturation 48.0 %   Carboxyhemoglobin 1.2 0.5 - 1.5 %   Methemoglobin 0.7 0.0 - 1.5 %    Comment: Performed at [facility] [address], [place], [place] [address]  Glucose, capillary     Status: None   Collection Time: 01/11/20  4:10 PM  Result Value Ref Range   Glucose-Capillary 71 70 - 99 mg/dL    Comment: Glucose reference range applies only to samples taken after fasting for at least 8 hours.   Comment 1 Notify RN    Comment 2 Document in Chart   Glucose, capillary     Status: Abnormal   Collection Time: 01/11/20  9:05 PM  Result Value Ref Range   Glucose-Capillary 107 (H) 70 - 99 mg/dL    Comment: Glucose reference range applies only to samples taken after fasting for at least 8 hours.   Comment 1 Notify RN    Comment 2 Document in Chart   Cooxemetry Panel (carboxy, met, total hgb, O2 sat)     Status: Abnormal   Collection Time: 01/12/20  5:30 AM  Result Value Ref Range   Total hemoglobin 13.9 12.0 - 16.0 g/dL   O2 Saturation 64.5 %   Carboxyhemoglobin 1.7 (H) 0.5 - 1.5 %   Methemoglobin 0.5 0.0 - 1.5 %    Comment: Performed at [facility] [address], [place], [place] [address]  Basic metabolic panel     Status: Abnormal   Collection Time: 01/12/20   5:30 AM  Result Value Ref Range   Sodium 134 (L) 135 - 145 mmol/L   Potassium 3.7 3.5 - 5.1 mmol/L   Chloride 96 (L) 98 - 111 mmol/L   CO2 27 22 - 32 mmol/L   Glucose, Bld 121 (H) 70 - 99 mg/dL    Comment: Glucose reference range applies only to samples taken after fasting for at least 8 hours.   BUN 32 (H) 8 - 23 mg/dL   Creatinine, Ser 2.56 (H) 0.61 - 1.24 mg/dL   Calcium 9.0 8.9 - 10.3 mg/dL   GFR, Estimated 27 (L) >60 mL/min    Comment: (NOTE) Calculated using the CKD-EPI Creatinine Equation (2021)    Anion gap 11 5 - 15    Comment:  Performed at [facility], [place] [address], [place], [place] [address]  CBC     Status: Abnormal   Collection Time: 01/12/20  5:30 AM  Result Value Ref Range   WBC 4.7 4.0 - 10.5 K/uL   RBC 2.81 (L) 4.22 - 5.81 MIL/uL   Hemoglobin 8.3 (L) 13.0 - 17.0 g/dL   HCT 26.3 (L) 39 - 52 %   MCV 93.6 80.0 - 100.0 fL   MCH 29.5 26.0 - 34.0 pg   MCHC 31.6 30.0 - 36.0 g/dL   RDW 18.4 (H) 11.5 - 15.5 %   Platelets 171 150 - 400 K/uL   nRBC 0.0 0.0 - 0.2 %    Comment: Performed at [facility], [place] [address] [place], [place] [address]  Glucose, capillary     Status: None   Collection Time: 01/12/20  6:10 AM  Result Value Ref Range   Glucose-Capillary 95 70 - 99 mg/dL    Comment: Glucose reference range applies only to samples taken after fasting for at least 8 hours.   No results found.  Anti-infectives (From admission, onward)   None     Assessment/Plan HTN HLD Acute on Chronic HF 2/2 NICM w/ AICD (EF 20-25% 11/8) - per Cards Hep C Hx of CVA Hx of DVT s/p IVC filter  A. Fib on Eliquis Chronic Kidney Disease - Cr 2.56 - Per Primary Team -   Hx of Acute Cholecystitis 12/12/2018 s/p Perc Chole Drain by IR 12/13/2018 Drainage from prior Perc Chole drain site  - This is a 69 year old male with multiple medical problems as listed above who we are asked to see for problems with patients Perc Chole drain. Drain was previously placed by  IR on 12/13/2018 after patient was hospitalized with acute cholecystitis. During patients last drain exchange on 09/23/2019 the gallbladder was significantly contracted and scarred, and a 10 French drain would not form within the gallbladder.  Patient was to follow-up with Dr. Zenia Resides in the office but missed his last appointments. No imaging of the gallbladder obtained during admission prior to our evaluation. No recent LFTs during admission.  WBC 4.7. No TTP on exam and he is tolerating a diet without abdomianl pain, n/v. Clinically patient does not have Acute Cholecystitis. Patient is not a good surgical candidate. On exam there is an area of hypergranulation tissue from prior drain site. I will cauterize with silver nitrate. He was noted to have some purulence on dressing. There is no evidence of superficial underlying abscess or cellulitis. Patient may have underlying fistula from prior perc chole drain site but currently is without any abdominal pain/tenderness, normal wbc and afebrile. If patient becomes symptomatic, could consider imaging to determine if patient needs a replacement IR drain.   FEN - HH diet  VTE - SCDs, heparin gtt ID - None   Jillyn Ledger, [facility] Surgery 01/12/2020, 10:27 AM Please see Amion for pager number during day hours 7:00am-4:30pm

## 2020-01-12 NOTE — Progress Notes (Addendum)
Patient ID: Glenn [name], male   DOB: April 04, 1950, 69 y.o.   MRN: [id number]     Advanced Heart Failure Rounding Note  PCP-Cardiologist: Virl Axe, MD   Subjective:    PICC placed 11/6 with initial CO-OX 31% and started Milrinone 0.25. On 11/8 txf ICU due to Co-ox remaining 30-40% on milrinone & started NE.  11/10: Code Stroke for L facial droop w/ slurred speech. Ultimately switch off milrinone and placed on DBA for persistently low co-ox.  Off NE 11/11. DC-CV 11/6.   Yesterday: Co-ox 49%, CVP 15, increased dose of dobutamine to 3, and reduced lasix 80 IV daily.  Discussed with patient, unable to titrate off inotropic support.  Patient would like all therapies and ideally would like to be off medication prior to discharge.  Placed palliative consult to discuss goals of care.    Today: Co-ox 65%, CVP 11 on dobutamine 3.  Denies CP or SOB.  Reports tender to where biliary drain was.     Objective:   Weight Range: 75.3 kg Body mass index is 26 kg/m.   Vital Signs:   Temp:  [97.8 F (36.6 C)-98.4 F (36.9 C)] 98.1 F (36.7 C) (11/23 0343) Pulse Rate:  [70-80] 70 (11/23 0343) Resp:  [14-22] 22 (11/23 0343) BP: (100-127)/(56-72) 127/66 (11/23 0343) SpO2:  [95 %-98 %] 98 % (11/23 0343) Weight:  [75.3 kg] 75.3 kg (11/23 0500) Last BM Date: 01/11/20 (patient said he passed a small amount of solid stool)  Weight change: Filed Weights   01/10/20 0346 01/11/20 0407 01/12/20 0500  Weight: 75.3 kg 74.4 kg 75.3 kg    Intake/Output:   Intake/Output Summary (Last 24 hours) at 01/12/2020 0658 Last data filed at 01/11/2020 2200 Gross per 24 hour  Intake 244 ml  Output 1475 ml  Net -1231 ml      Physical Exam   CVP 11 General:  Well appearing. No resp difficulty HEENT: normal Neck: supple. + JVD. Carotids 2+ bilat; no bruits. Cor: PMI nondisplaced. Regular rate & rhythm. No rubs, or gallops. + murmurs. Lungs: clear bilaterally.  Abdomen: soft, nontender, nondistended.  No hepatosplenomegaly. No bruits or masses. Good bowel sounds. Drainage noted at location of previous biliary drain.   Extremities: no cyanosis, clubbing, rash, edema Neuro: alert & oriented x 3, cranial nerves grossly intact. moves all 4 extremities w/o difficulty. Affect pleasant.    Telemetry   1st AVB, PR 0.21.  Rates 70-80s.  Personally reviewed.   Labs    CBC Recent Labs    01/12/20 0530  WBC 4.7  HGB 8.3*  HCT 26.3*  MCV 93.6  PLT 081   Basic Metabolic Panel Recent Labs    01/10/20 0455 01/10/20 0455 01/11/20 0416 01/12/20 0530  NA 135   < > 135 134*  K 3.9   < > 4.1 3.7  CL 95*   < > 96* 96*  CO2 29   < > 29 27  GLUCOSE 155*   < > 108* 121*  BUN 36*   < > 37* 32*  CREATININE 2.77*   < > 2.90* 2.56*  CALCIUM 8.8*   < > 9.3 9.0  MG 2.3  --  2.1  --    < > = values in this interval not displayed.   Liver Function Tests No results for input(s): AST, ALT, ALKPHOS, BILITOT, PROT, ALBUMIN in the last 72 hours. No results for input(s): LIPASE, AMYLASE in the last 72 hours. Cardiac Enzymes No results for input(s):  CKTOTAL, CKMB, CKMBINDEX, TROPONINI in the last 72 hours.  BNP: BNP (last 3 results) Recent Labs    06/01/19 1413 06/23/19 1527 12/25/19 1757  BNP 531.4* 613.2* 776.7*    ProBNP (last 3 results) No results for input(s): PROBNP in the last 8760 hours.   D-Dimer No results for input(s): DDIMER in the last 72 hours. Hemoglobin A1C No results for input(s): HGBA1C in the last 72 hours. Fasting Lipid Panel No results for input(s): CHOL, HDL, LDLCALC, TRIG, CHOLHDL, LDLDIRECT in the last 72 hours. Thyroid Function Tests No results for input(s): TSH, T4TOTAL, T3FREE, THYROIDAB in the last 72 hours.  Invalid input(s): FREET3  Other results:   Imaging    No results found.   Medications:     Scheduled Medications: . apixaban  5 mg Oral BID  . atorvastatin  40 mg Oral QPM  . Chlorhexidine Gluconate Cloth  6 each Topical Daily  .  docusate sodium  100 mg Oral BID  . feeding supplement  237 mL Oral TID BM  . furosemide  80 mg Intravenous Daily  . gabapentin  300 mg Oral BID  . insulin aspart  0-9 Units Subcutaneous TID WC  . melatonin  3 mg Oral QHS  . pantoprazole  40 mg Oral Daily  . sodium chloride flush  10-40 mL Intracatheter Q12H    Infusions: . amiodarone 30 mg/hr (01/11/20 0806)  . DOBUTamine 3 mcg/kg/min (01/11/20 1029)    PRN Medications: acetaminophen, ALPRAZolam, calcium carbonate, ondansetron (ZOFRAN) IV, polyethylene glycol, sodium chloride flush    Patient Profile  69 y.o. male with a hx of chronic systolic HF 2/2 NICM s/p BSci ICD, persistent atrial fibrillation, h/o stroke (Lg L MCA infarct w/ hemorrhagic conversion in 2018), HTN, HCV, tobacco abuse (smokes ~1/4 ppd),  and chronic cholecystitis with a chronic perc drain.   Admitted with ADHF. Started on milrinone>> later transitioned to DBA for persistently low co-ox.    Assessment/Plan   1. Acute on chronic systolic biventricular  HF due to NICM -> cardiogenic shock - suspect AF cardiomyopathy - ICD placed Nov 2015 - [facility] 10/2018 showed normal cors, elevated filling pressures and low CI at 1.9  - TTE 11/2018 LVEF 20%, mild RV dysfunction - Admit 06/2019 with severe HF, required IABP and milrinone. TEE 5/21 EF 20-25% with moderate to severe RV dysfunction.  - Admitted 12/25/2019 with ADHF. Milrinone 0.25 mcg started 11/7 due to low output HF. Echo 12/28/19 EF 20-25% RV mildly decreased. Moved to ICU on 11/8 due to persistently low co-ox on milrinone. 12/29/19: Continued w/ persistently low co-ox despite milrinone 0.5 and NE which was stopped d/t elevated MAPs. Gave metolazone 2/2 low UOP. Transitioned to dobutamine gtt starting at 2.5 mcg then increased to 3 mcg. Required NE but has been off since 11/11.  - Suspect RV function worse than it looks on echo.  - CVP 11, co-ox 65% - CVP goal around 10 with RV failure.  - Continue with dobutamine 3.  Difficulty weaning, when wean seen drop in co-ox and increase in CVP.  - C/w IV lasix 80 daily.  - No beta blockers due to low output HF - No dig or ACE/ARB/ARNI with worsening renal function.  - Options remain very limited - poor LVAD candidate with noncompliance and renal failure. He did not make one visit to HF or EP clinic after dramatic hospitalization for AF and cardiogenic shock in 5/21 - Main issue seems to be RV failure. Hard to know what endpoint will  be a this time. Hopefully will have some EF improvement with maintaining NSR.    2. Concerning for acute ischemic R MCA -hx of large R MCA with hemorrhagic conversion in Sept 2020 & L MCA s/p thrombectomy M2 occlusion in 2018.  -11/10 around 14:00 developed new L facial droop, LUE weakness and slurred speech - Code Stroke called.  Neuro recs appreciated, did not tolerate change in ICD settings for MRI/MRA; Repeat CT on 11/12 no acute findings, prior remote R MCA infarct. - A1c 4.6%, LDL 63, HDL 26, Cholesterol 103, Triglycerides 68 - Atrovastatin dose increased from home dose.  - No change  3.  Persistent Afib with RVR - In 5/21 had refractory AF requiring multiple DC-CVs, high-dose amio and ranexa.  -He reported intermittent compliance with amio and Eliquis. Out of eliquis prior to admission. Transitioned from eliquis to heparin gtt due to possible need for procedures on 11/9 and planned for TEE DC-CV was scheduled 11/10, however canceled due to new neuro symptoms. - Stopped ranexa on 11/9. - DCCV 11/17 converted to SR. - Remains in NSR.  - Continue with amiodarone 30 gtt while on DBA. - Continue with eliquis 5 mg BID.  - No bleeding.    4. Acute on CKD IIIb - Baseline Cr looks to be around 1.6 to 1.7 - Admission Cr 1.69 and Peak Cr 2.9 -Today creatinine 2.6 - Likely ATN/cardiorenal - Continue to monitor BMET daily   5. Elevated troponin - 10/2018 cath clean coronaries - elevated trops due to afib with RVR, CHF - Episode of  chest pain and shortness of breath that resolved last night.  EKG obtained no ST changes.    6. Chronic cholecystitis with perc drain -  Per primary team. Perc drain now fell out - Drainage present; ordered culture and reconsult surgery -  Will follow up outpatient    7. Iron deficiency anemia - Iron sats low and has received feraheme. - Hgb stable    8. Depression - Watch QT closely on amio.  - Stopped zoloft & atarax 11/10 d/t prolong QT.   - Ranexa stopped 11/9.  -EKG 11/19 QTc 504   Carlene Coria, NP  6:58 AM   Patient seen and examined with the above-signed Advanced Practice Provider and/or Housestaff. I personally reviewed laboratory data, imaging studies and relevant notes. I independently examined the patient and formulated the important aspects of the plan. I have edited the note to reflect any of my changes or salient points. I have personally discussed the plan with the patient and/or family.  Dobutamine turned back up to 3 yesterday. Co-ox improved. He is feeling better. Remains in NSR on I amio. Diuresing with IV lasix. Ab wound looks purulent  General:  Lying in bed.  No resp difficulty HEENT: normal Neck: supple. JVP to jaw. Carotids 2+ bilat; no bruits. No lymphadenopathy or thryomegaly appreciated. Cor: PMI nondisplaced. Regular rate & rhythm. No rubs, gallops or murmurs. Lungs: clear Abdomen: purulent site in RUQ soft, nontender, nondistended. No hepatosplenomegaly. No bruits or masses. Good bowel sounds. Extremities: no cyanosis, clubbing, rash, edema Neuro: alert & orientedx3, cranial nerves grossly intact. moves all 4 extremities w/o difficulty. Affect pleasant  He appears to be inotrope dependent. Will plan formal RHC tomorrow to assess full hemodynamics. Suspect he will need home dobutamine at least in short term. Can switch IV amio to po. Continue Eliquis. GSU to evaluate ab wound.   Glori Bickers, MD  2:11 PM

## 2020-01-12 NOTE — Progress Notes (Signed)
Nutrition Follow-up  RD working remotely.  DOCUMENTATION CODES:   Not applicable  INTERVENTION:   - Continue Ensure Enlive po TID, each supplement provides 350 kcal and 20 grams of protein  - Encourage adequate PO intake  NUTRITION DIAGNOSIS:   Inadequate oral intake related to decreased appetite, nausea as evidenced by per patient/family report, meal completion < 50%.  Progressing  GOAL:   Patient will meet greater than or equal to 90% of their needs  Progressing  MONITOR:   PO intake, Supplement acceptance, Labs, Weight trends  REASON FOR ASSESSMENT:   Malnutrition Screening Tool    ASSESSMENT:   69 yo male admitted with acute on chronic CHF due to NICM with cardiogenic shock. PMH includes chronic CHF, hx of stroke (large L MCA infart with hemorrhagic conversion in 2018), HTN, chronic cholecystitis with chronic perc drain.  11/10 - code stroke for L facial droop and slurred speech  Per HF team note, pt appears to be inotrope dependent. Palliative Care has been consulted. Plan is for formal RHC tomorrow to assess full hemodynamics.  Surgery was reconsulted to evaluate abdominal wound. Per Surgery note, no urgent surgical needs at this time.  RD was unable to reach pt via phone call to room. Pt currently on Heart Healthy diet with improved meal completions (averaging 91%). Pt has accepted all but 1 Ensure Enlive since ordered on 01/05/20 per [facility] documentation. Will continue with current supplement regimen.  Weight stable compared to admit weight.  Meal Completion: 50-100% (averaging 91%)  Medications reviewed and include: colace, Ensure Enlive TID, lasix, SSI, protonix, dobutamine  Labs reviewed: sodium 134, BUN 32, creatinine 2.56, hemoglobin 8.3 CBG's: 71-137 x 24 hours  UOP: 1475 ml x 24 hours I/O's: -11.5 L since admit  Diet Order:   Diet Order            Diet NPO time specified  Diet effective midnight           Diet Heart Room service appropriate?  Yes; Fluid consistency: Thin; Fluid restriction: 2000 mL Fluid  Diet effective now                 EDUCATION NEEDS:   Not appropriate for education at this time  Skin:  Skin Assessment: Skin Integrity Issues: Incisions: site of perc drain (felt out this admission) draining purulent draiange  Last BM:  01/11/20  Height:   Ht Readings from Last 1 Encounters:  12/26/19 5\' 7"  (1.702 m)    Weight:   Wt Readings from Last 1 Encounters:  01/12/20 75.3 kg    BMI:  Body mass index is 26 kg/m.  Estimated Nutritional Needs:   Kcal:  2200-2400 kcals  Protein:  110-120 grams  Fluid:  1.8 L    Gustavus Bryant, MS, RD, LDN Inpatient Clinical Dietitian Please see AMiON for contact information.

## 2020-01-12 NOTE — Progress Notes (Signed)
Physical Therapy Treatment Patient Details Name: Glenn [name] MRN: [id number] DOB: [birth year] Today's Date: 01/12/2020    History of Present Illness Glenn [name] is a 69 y.o. male with a hx of chronic systolic HF 2/2 NICM s/p BSci ICD, atrial fibrillation, h/o stroke (Lg L MCA infarct w/ hemorrhagic conversion in 2018), h/o DVT, chornic a/c therapy, HTN, HLD, pulmonary nodule, Hepatitis C, carpal tunnel,  tobacco abuse (smokes ~1/4 ppd) and chronic cholecystitis with a chronic perc drain, who is being seen today for the evaluation of Afib RVR and CHF at the request of Dr Regenia Skeeter. Pt experienced onset of L side weakness on 12/30/19, CT negative for acute changes, pt cannot have an MRI.    PT Comments    Visited pt earlier in day and he requested to come back later. This afternoon pt initially was hesitant but at end of session very appreciative of ambulation, stating that he felt better. Pt is progressing towards his goals and is currently independent with bed mobility, supervision with transfers and min guard for ambulation of 450 feet with RW. D/c plans remain appropriate at this time. PT will continue to follow acutely.   Follow Up Recommendations  Home health PT;Supervision for mobility/OOB     Equipment Recommendations  Rolling walker with 5" wheels    Recommendations for Other Services       Precautions / Restrictions Precautions Precautions: Fall Precaution Comments: watch HR Restrictions Weight Bearing Restrictions: No    Mobility  Bed Mobility Overal bed mobility: Modified Independent Bed Mobility: Supine to Sit;Sit to Supine     Supine to sit: Modified independent (Device/Increase time);HOB elevated Sit to supine: Modified independent (Device/Increase time);HOB elevated   General bed mobility comments: no assist needed, help to manage lines.  Transfers Overall transfer level: Needs assistance Equipment used: Rolling walker (2 wheeled) Transfers: Sit  to/from Stand Sit to Stand: Supervision         General transfer comment: for safety and lines  Ambulation/Gait Ambulation/Gait assistance: Min guard Gait Distance (Feet): 450 Feet Assistive device: Rolling walker (2 wheeled) Gait Pattern/deviations: Decreased weight shift to left;Decreased step length - right;Decreased dorsiflexion - left;Drifts right/left Gait velocity: decreased Gait velocity interpretation: 1.31 - 2.62 ft/sec, indicative of limited community ambulator General Gait Details: min guard for safety with slow, steady gait         Balance Overall balance assessment: Needs assistance Sitting-balance support: Feet supported;No upper extremity supported Sitting balance-Leahy Scale: Good Sitting balance - Comments: Pt able to sit EOB without UE support   Standing balance support: During functional activity Standing balance-Leahy Scale: Fair                              Cognition Arousal/Alertness: Awake/alert Behavior During Therapy: WFL for tasks assessed/performed Overall Cognitive Status: Within Functional Limits for tasks assessed                                           General Comments General comments (skin integrity, edema, etc.): max noted HR 95bpm      Pertinent Vitals/Pain Pain Assessment: No/denies pain           PT Goals (current goals can now be found in the care plan section) Acute Rehab PT Goals Patient Stated Goal: return home PT Goal Formulation: With patient Time For Goal  Achievement: 01/10/20 Potential to Achieve Goals: Good Progress towards PT goals: Progressing toward goals    Frequency    Min 3X/week      PT Plan Current plan remains appropriate       AM-PAC PT "6 Clicks" Mobility   Outcome Measure  Help needed turning from your back to your side while in a flat bed without using bedrails?: None Help needed moving from lying on your back to sitting on the side of a flat bed without  using bedrails?: None Help needed moving to and from a bed to a chair (including a wheelchair)?: None Help needed standing up from a chair using your arms (e.g., wheelchair or bedside chair)?: None Help needed to walk in hospital room?: None Help needed climbing 3-5 steps with a railing? : A Little 6 Click Score: 23    End of Session Equipment Utilized During Treatment: Gait belt Activity Tolerance: Patient tolerated treatment well Patient left: in bed;with call bell/phone within reach Nurse Communication: Mobility status PT Visit Diagnosis: Difficulty in walking, not elsewhere classified (R26.2)     Time: 8350-7573 PT Time Calculation (min) (ACUTE ONLY): 23 min  Charges:  $Therapeutic Exercise: 23-37 mins                     Yukie Bergeron B. Migdalia Dk PT, DPT Acute Rehabilitation Services Pager [phone] Office [phone]    [place] 01/12/2020, 5:02 PM

## 2020-01-13 ENCOUNTER — Inpatient Hospital Stay (HOSPITAL_COMMUNITY)
Admission: EM | Disposition: A | Discharge status: Home Health | Payer: Self-pay | Source: Home / Self Care | Attending: Internal Medicine

## 2020-01-13 ENCOUNTER — Encounter (HOSPITAL_COMMUNITY): Payer: Self-pay | Admitting: Internal Medicine

## 2020-01-13 DIAGNOSIS — Z7189 Other specified counseling: Secondary | ICD-10-CM

## 2020-01-13 DIAGNOSIS — I5023 Acute on chronic systolic (congestive) heart failure: Secondary | ICD-10-CM | POA: Diagnosis not present

## 2020-01-13 DIAGNOSIS — Z515 Encounter for palliative care: Secondary | ICD-10-CM | POA: Diagnosis not present

## 2020-01-13 DIAGNOSIS — I5043 Acute on chronic combined systolic (congestive) and diastolic (congestive) heart failure: Secondary | ICD-10-CM | POA: Diagnosis not present

## 2020-01-13 DIAGNOSIS — I4819 Other persistent atrial fibrillation: Secondary | ICD-10-CM | POA: Diagnosis not present

## 2020-01-13 HISTORY — PX: RIGHT HEART CATH: CATH118263

## 2020-01-13 LAB — BASIC METABOLIC PANEL
Anion gap: 11 (ref 5–15)
BUN: 27 mg/dL — ABNORMAL HIGH (ref 8–23)
CO2: 28 mmol/L (ref 22–32)
Calcium: 9.1 mg/dL (ref 8.9–10.3)
Chloride: 96 mmol/L — ABNORMAL LOW (ref 98–111)
Creatinine, Ser: 2.35 mg/dL — ABNORMAL HIGH (ref 0.61–1.24)
GFR, Estimated: 30 mL/min — ABNORMAL LOW (ref >60)
Glucose, Bld: 124 mg/dL — ABNORMAL HIGH (ref 70–99)
Potassium: 3.9 mmol/L (ref 3.5–5.1)
Sodium: 135 mmol/L (ref 135–145)

## 2020-01-13 LAB — POCT I-STAT EG7
Acid-Base Excess: 3 mmol/L — ABNORMAL HIGH (ref 0.0–2.0)
Acid-Base Excess: 6 mmol/L — ABNORMAL HIGH (ref 0.0–2.0)
Bicarbonate: 29 mmol/L — ABNORMAL HIGH (ref 20.0–28.0)
Bicarbonate: 31.4 mmol/L — ABNORMAL HIGH (ref 20.0–28.0)
Calcium, Ion: 1.04 mmol/L — ABNORMAL LOW (ref 1.15–1.40)
Calcium, Ion: 1.2 mmol/L (ref 1.15–1.40)
HCT: 25 % — ABNORMAL LOW (ref 39.0–52.0)
HCT: 27 % — ABNORMAL LOW (ref 39.0–52.0)
Hemoglobin: 8.5 g/dL — ABNORMAL LOW (ref 13.0–17.0)
Hemoglobin: 9.2 g/dL — ABNORMAL LOW (ref 13.0–17.0)
O2 Saturation: 67 %
O2 Saturation: 68 %
Potassium: 3.4 mmol/L — ABNORMAL LOW (ref 3.5–5.1)
Potassium: 3.8 mmol/L (ref 3.5–5.1)
Sodium: 140 mmol/L (ref 135–145)
Sodium: 143 mmol/L (ref 135–145)
TCO2: 30 mmol/L (ref 22–32)
TCO2: 33 mmol/L — ABNORMAL HIGH (ref 22–32)
pCO2, Ven: 48.3 mmHg (ref 44.0–60.0)
pCO2, Ven: 51.1 mmHg (ref 44.0–60.0)
pH, Ven: 7.386 (ref 7.250–7.430)
pH, Ven: 7.396 (ref 7.250–7.430)
pO2, Ven: 36 mmHg (ref 32.0–45.0)
pO2, Ven: 36 mmHg (ref 32.0–45.0)

## 2020-01-13 LAB — COOXEMETRY PANEL
Carboxyhemoglobin: 1.9 % — ABNORMAL HIGH (ref 0.5–1.5)
Methemoglobin: 0.4 % (ref 0.0–1.5)
O2 Saturation: 61.5 %
Total hemoglobin: 9 g/dL — ABNORMAL LOW (ref 12.0–16.0)

## 2020-01-13 LAB — GLUCOSE, CAPILLARY
Glucose-Capillary: 94 mg/dL (ref 70–99)
Glucose-Capillary: 167 mg/dL — ABNORMAL HIGH (ref 70–99)
Glucose-Capillary: 97 mg/dL (ref 70–99)
Glucose-Capillary: 219 mg/dL — ABNORMAL HIGH (ref 70–99)

## 2020-01-13 SURGERY — RIGHT HEART CATH
Anesthesia: LOCAL

## 2020-01-13 MED ORDER — MIDAZOLAM HCL 2 MG/2ML IJ SOLN
INTRAMUSCULAR | Status: AC
  Filled 2020-01-13: qty 2

## 2020-01-13 MED ORDER — LIDOCAINE HCL (PF) 1 % IJ SOLN
INTRAMUSCULAR | Status: DC | PRN
  Administered 2020-01-13: 5 mL

## 2020-01-13 MED ORDER — HEPARIN (PORCINE) IN NACL 1000-0.9 UT/500ML-% IV SOLN
INTRAVENOUS | Status: DC | PRN
  Administered 2020-01-13: 500 mL

## 2020-01-13 MED ORDER — FENTANYL CITRATE (PF) 100 MCG/2ML IJ SOLN
INTRAMUSCULAR | Status: DC | PRN
  Administered 2020-01-13: 25 ug via INTRAVENOUS

## 2020-01-13 MED ORDER — FENTANYL CITRATE (PF) 100 MCG/2ML IJ SOLN
INTRAMUSCULAR | Status: AC
  Filled 2020-01-13: qty 2

## 2020-01-13 MED ORDER — MIDAZOLAM HCL 2 MG/2ML IJ SOLN
INTRAMUSCULAR | Status: DC | PRN
  Administered 2020-01-13: 2 mg via INTRAVENOUS
  Administered 2020-01-13: 1 mg via INTRAVENOUS

## 2020-01-13 MED ORDER — HEPARIN (PORCINE) IN NACL 1000-0.9 UT/500ML-% IV SOLN
INTRAVENOUS | Status: AC
  Filled 2020-01-13: qty 500

## 2020-01-13 MED ORDER — TORSEMIDE 20 MG PO TABS
60.0000 mg | ORAL_TABLET | Freq: Every day | ORAL | Status: DC
  Administered 2020-01-14 – 2020-01-18 (×5): 60 mg via ORAL
  Filled 2020-01-13 (×5): qty 3

## 2020-01-13 MED ORDER — LIDOCAINE HCL (PF) 1 % IJ SOLN
INTRAMUSCULAR | Status: AC
  Filled 2020-01-13: qty 30

## 2020-01-13 MED ORDER — FUROSEMIDE 10 MG/ML IJ SOLN
80.0000 mg | Freq: Once | INTRAMUSCULAR | Status: AC
  Administered 2020-01-13: 80 mg via INTRAVENOUS
  Filled 2020-01-13: qty 8

## 2020-01-13 SURGICAL SUPPLY — 10 items
CATH SWAN GANZ 7F STRAIGHT (CATHETERS) ×1 IMPLANT
KIT MICROPUNCTURE NIT STIFF (SHEATH) ×1 IMPLANT
PACK CARDIAC CATHETERIZATION (CUSTOM PROCEDURE TRAY) ×2 IMPLANT
PROTECTION STATION PRESSURIZED (MISCELLANEOUS) ×2
SHEATH PINNACLE 7F 10CM (SHEATH) ×1 IMPLANT
SHEATH PROBE COVER 6X72 (BAG) ×1 IMPLANT
STATION PROTECTION PRESSURIZED (MISCELLANEOUS) IMPLANT
TRANSDUCER W/STOPCOCK (MISCELLANEOUS) ×2 IMPLANT
TUBING ART PRESS 72  MALE/FEM (TUBING) ×1
TUBING ART PRESS 72 MALE/FEM (TUBING) IMPLANT

## 2020-01-13 NOTE — Progress Notes (Signed)
OT Cancellation Note  Patient Details Name: Glenn [name] MRN: [id number] DOB: 1950/12/02   Cancelled Treatment:    Reason Eval/Treat Not Completed: Patient at procedure or test/ unavailable (Pt in cath lab.)  Malka So 01/13/2020, 8:26 AM  Nestor Lewandowsky, OTR/L Acute Rehabilitation Services Pager: [phone] Office: [phone]

## 2020-01-13 NOTE — Progress Notes (Signed)
Physical Therapy Treatment Patient Details Name: Glenn [name] MRN: [id number] DOB: [birth year] Today's Date: 01/13/2020    History of Present Illness Glenn [name] is a 69 y.o. male with a hx of chronic systolic HF 2/2 NICM s/p BSci ICD, atrial fibrillation, h/o stroke (Lg L MCA infarct w/ hemorrhagic conversion in 2018), h/o DVT, chornic a/c therapy, HTN, HLD, pulmonary nodule, Hepatitis C, carpal tunnel,  tobacco abuse (smokes ~1/4 ppd) and chronic cholecystitis with a chronic perc drain, who is being seen today for the evaluation of Afib RVR and CHF at the request of Dr Regenia Skeeter. Pt experienced onset of L side weakness on 12/30/19, CT negative for acute changes, pt cannot have an MRI.    PT Comments    Pt supine in bed on arrival.  Pt aggreeable to PT session.  He remains attached to lines and leads and requires cues for safety to avoid pulling on lines and leads.  Focused on progression to stair training and stair goal is now met.  He continues to require supervision for safety with gt training.  Continue to recommend HHPT at this time.     Follow Up Recommendations  Home health PT;Supervision for mobility/OOB     Equipment Recommendations  Rolling walker with 5" wheels    Recommendations for Other Services       Precautions / Restrictions Precautions Precautions: Fall Precaution Comments: watch HR Restrictions Weight Bearing Restrictions: No    Mobility  Bed Mobility Overal bed mobility: Modified Independent Bed Mobility: Supine to Sit;Sit to Supine     Supine to sit: Modified independent (Device/Increase time);HOB elevated Sit to supine: Modified independent (Device/Increase time);HOB elevated   General bed mobility comments: no assist needed, help to manage lines.  Transfers Overall transfer level: Needs assistance Equipment used: Rolling walker (2 wheeled) Transfers: Sit to/from Stand Sit to Stand: Supervision         General transfer comment:  for safety and lines  Ambulation/Gait Ambulation/Gait assistance: Supervision Gait Distance (Feet): 450 Feet Assistive device: Rolling walker (2 wheeled) Gait Pattern/deviations: Decreased weight shift to left;Decreased step length - right;Decreased dorsiflexion - left;Drifts right/left Gait velocity: decreased   General Gait Details: min guard for safety with slow, steady gait   Stairs Stairs: Yes Stairs assistance: Supervision Stair Management: One rail Right Number of Stairs: 6 General stair comments: Cues for safety.   Wheelchair Mobility    Modified Rankin (Stroke Patients Only)       Balance Overall balance assessment: Needs assistance Sitting-balance support: Feet supported;No upper extremity supported Sitting balance-Leahy Scale: Good Sitting balance - Comments: Pt able to sit EOB without UE support     Standing balance-Leahy Scale: Fair                              Cognition Arousal/Alertness: Awake/alert Behavior During Therapy: WFL for tasks assessed/performed Overall Cognitive Status: Within Functional Limits for tasks assessed                                 General Comments: A/O x4      Exercises      General Comments        Pertinent Vitals/Pain Pain Assessment: Faces Faces Pain Scale: Hurts even more Pain Location: R side of his neck Pain Descriptors / Indicators: Discomfort;Grimacing Pain Intervention(s): Monitored during session;Repositioned    Home Living  Prior Function            PT Goals (current goals can now be found in the care plan section) Acute Rehab PT Goals Patient Stated Goal: return home Potential to Achieve Goals: Good Progress towards PT goals: Progressing toward goals    Frequency    Min 3X/week      PT Plan Current plan remains appropriate    Co-evaluation              AM-PAC PT "6 Clicks" Mobility   Outcome Measure  Help needed  turning from your back to your side while in a flat bed without using bedrails?: None Help needed moving from lying on your back to sitting on the side of a flat bed without using bedrails?: None Help needed moving to and from a bed to a chair (including a wheelchair)?: None Help needed standing up from a chair using your arms (e.g., wheelchair or bedside chair)?: None Help needed to walk in hospital room?: None Help needed climbing 3-5 steps with a railing? : None 6 Click Score: 24    End of Session Equipment Utilized During Treatment: Gait belt Activity Tolerance: Patient tolerated treatment well Patient left: in bed;with call bell/phone within reach Nurse Communication: Mobility status PT Visit Diagnosis: Difficulty in walking, not elsewhere classified (R26.2)     Time: 1243-2755 PT Time Calculation (min) (ACUTE ONLY): 27 min  Charges:  $Gait Training: 23-37 mins                     [name] , PTA Acute Rehabilitation Services Pager [phone] Office [phone]     [name] 01/13/2020, 4:32 PM

## 2020-01-13 NOTE — Consult Note (Signed)
Consultation Note Date: 01/13/2020   Patient Name: Glenn [name]  DOB: December 21, 1950  MRN: [id number]  Age / Sex: 69 y.o., male  PCP: Gerlene Fee, DO Referring Physician: Jolaine Artist, MD  Reason for Consultation: Establishing goals of care and Psychosocial/spiritual support  HPI/Patient Profile: 69 y.o. male  admitted on 12/25/2019 with   PMH  significant for A. fib with RVR on Eliquis, CKD 3, HLD, Large left MCA infarction with hemorrhagic conversion  embolic due to A. fib (July 2018), R MCA CVA (September 2020), ICD (2018), HTN, chronic hep C, tobacco use disorder, and ischemic cardiomyopathy.  Chronic cholecystitis status is percutaneous drain.  Patient has had multiple rehospitalizations in the last 6 months and continued physical and functional decline.  Today is day 18 of this hospitalization.   Limited social support  Patient faces treatment option decisions, advanced directive decisions and anticipatory care needs     Clinical Assessment and Goals of Care:   This NP Wadie Lessen reviewed medical records, received report from team, assessed the patient and then meet at the patient's bedside  to discuss diagnosis, prognosis, GOC, EOL wishes disposition and options.   Concept of Palliative Care was introduced as specialized medical care for people and their families living with serious illness.  If focuses on providing relief from the symptoms and stress of a serious illness.  The goal is to improve quality of life for both the patient and the family.  Created space and opportunity for patient  and family to explore thoughts and feelings regarding current medical situation. He verbalizes understanding of the seriousness of his current medical situation however he remains open to all offered and available medical interventions to prolong life and is hopeful for improvement.     A   discussion was had today regarding advanced directives.  Concepts specific to code status, artifical feeding and hydration, continued IV antibiotics and rehospitalization was had.  The difference between a aggressive medical intervention path  and a palliative comfort care path for this patient at this time was had.  Values and goals of care important to patient and family were attempted to be elicited.   MOST form  introduced     Questions and concerns addressed.  Patient  encouraged to call with questions or concerns.     PMT will continue to support holistically. Patient will benefit from continued support in conversation regarding goals of care and treatment plan during this hospitalization. Will explore possibility of family meeting.      No documented HPOA or AD. Encouraged patient to consider conversation with his support people and documentation of H POA and advanced directive with the support of spiritual care department while he is here in the hospital.  Patient tells me that his son [name] and his cousin Eduar Kumpf are his main supports     SUMMARY OF RECOMMENDATIONS    Code Status/Advance Care Planning:  Full code  Encouraged patient to consider DNR/DNI status understanding evidenced based poor outcomes in similar hospitalized  patient, as the cause of arrest is likely associated with advanced chronic illness rather than an easily reversible acute cardio-pulmonary event.   Palliative Prophylaxis:   Delirium Protocol and Frequent Pain Assessment  Additional Recommendations (Limitations, Scope, Preferences):  Full Scope Treatment  Psycho-social/Spiritual:   Desire for further Chaplaincy support:yes  Additional Recommendations: Therapeutic listening and emotional support  Prognosis:   Unable to determine  Discharge Planning: To Be Determined      Primary Diagnoses: Present on Admission: . Acute on chronic combined systolic and diastolic CHF  (congestive heart failure) ([place]) . Persistent atrial fibrillation with rapid ventricular response ([place]) . Atrial fibrillation with RVR ([place])   I have reviewed the medical record, interviewed the patient and family, and examined the patient. The following aspects are pertinent.  Past Medical History:  Diagnosis Date  . Acute blood loss anemia   . Acute CVA (cerebrovascular accident) ([place])   . Acute deep vein thrombosis (DVT) of both lower extremities ([name])   . Acute kidney injury ([place])   . Acute on chronic combined systolic and diastolic CHF (congestive heart failure) ([place])   . Acute renal failure superimposed on stage 3a chronic kidney disease ([place])   . Acute respiratory failure with hypoxia ([place])   . Atrial fibrillation ([place])   . Carpal tunnel syndrome of right wrist 02/28/2018  . Cerebral edema ([place]) 11/13/2018  . Cerebral infarction ([place])   . CHF (congestive heart failure) ([place])   . Cholecystitis 02/04/2019  . Chronic right hip pain   . DCM (dilated cardiomyopathy) ([place])   . Dysphagia, post-stroke   . Elevated troponin   . Entrapment of right ulnar nerve 02/28/2018  . Headache due to intracranial disease 11/14/2018  . Hepatitis C   . History of hemorrhagic stroke with residual hemiparesis ([place]) 02/04/2019  . HTN (hypertension) 08/14/2016  . Hyperlipidemia LDL goal <70 11/13/2018  . Hypertension   . ICD (implantable cardioverter-defibrillator) in place 09/13/2016  . Impotence due to erectile dysfunction 09/30/2017  . Ischemic cardiomyopathy   . Labile blood glucose   . Left leg DVT ([place]) 02/04/2019  . Marijuana user 11/13/2018  . Paroxysmal atrial fibrillation ([name])   . Right middle cerebral artery stroke ([place]) 11/13/2018  . Solitary pulmonary nodule 06/10/2017   5 mm RUL nodule noted incidentally as part of CVA workup 08/2016. With smoking history would obtain low-dose CT scan 08/2017.   . Stroke (cerebrum) ([name]) Lg L MCA infarct w/ hemorrhagic conversion, embolic d/t AF 2/70/3500   . Stroke ([place])   . Trochanteric bursitis, right hip 11/14/2018  . Visit for monitoring Tikosyn therapy 03/26/2017   Social History   Socioeconomic History  . Marital status: Single    Spouse name: Not on file  . Number of children: Not on file  . Years of education: 88 (some college)  . Highest education level: Not on file  Occupational History  . Occupation: disability  Tobacco Use  . Smoking status: Former Smoker    Packs/day: 0.50    Types: Cigarettes  . Smokeless tobacco: Never Used  . Tobacco comment: a pack last three days  Vaping Use  . Vaping Use: Never used  Substance and Sexual Activity  . Alcohol use: Not Currently    Alcohol/week: 3.0 standard drinks    Types: 3 Cans of beer per week    Comment: pt stop drinking   . Drug use: Not Currently    Frequency: 2.0 times per week    Types: Marijuana  Comment: stop smoking   . Sexual activity: Yes    Partners: Female    Birth control/protection: Condom  Other Topics Concern  . Not on file  Social History Narrative  . Not on file   Social Determinants of Health   Financial Resource Strain: Low Risk   . Difficulty of Paying Living Expenses: Not very hard  Food Insecurity: No Food Insecurity  . Worried About Charity fundraiser in the Last Year: Never true  . Ran Out of Food in the Last Year: Never true  Transportation Needs: No Transportation Needs  . Lack of Transportation (Medical): No  . Lack of Transportation (Non-Medical): No  Physical Activity:   . Days of Exercise per Week: Not on file  . Minutes of Exercise per Session: Not on file  Stress:   . Feeling of Stress : Not on file  Social Connections:   . Frequency of Communication with Friends and Family: Not on file  . Frequency of Social Gatherings with Friends and Family: Not on file  . Attends Religious Services: Not on file  . Active Member of Clubs or Organizations: Not on file  . Attends Archivist Meetings: Not on file  . Marital  Status: Not on file   Family History  Problem Relation Age of Onset  . High blood pressure Mother   . High blood pressure Father   . Stroke Maternal Aunt   . Heart disease Neg Hx    Scheduled Meds: . amiodarone  200 mg Oral BID  . apixaban  5 mg Oral BID  . atorvastatin  40 mg Oral QPM  . Chlorhexidine Gluconate Cloth  6 each Topical Daily  . docusate sodium  100 mg Oral BID  . feeding supplement  237 mL Oral TID BM  . gabapentin  300 mg Oral BID  . insulin aspart  0-9 Units Subcutaneous TID WC  . melatonin  3 mg Oral QHS  . pantoprazole  40 mg Oral Daily  . sodium chloride flush  10-40 mL Intracatheter Q12H  . sodium chloride flush  3 mL Intravenous Q12H   Continuous Infusions: . DOBUTamine 3 mcg/kg/min (01/11/20 1029)   PRN Meds:.acetaminophen, ALPRAZolam, calcium carbonate, ondansetron (ZOFRAN) IV, polyethylene glycol, sodium chloride flush Medications Prior to Admission:  Prior to Admission medications   Medication Sig Start Date End Date Taking? Authorizing Provider  acetaminophen (TYLENOL) 325 MG tablet Take 2 tablets (650 mg total) by mouth every 6 (six) hours as needed for mild pain (or Fever >/= 101). 07/16/19  Yes Clegg, Amy D, NP  apixaban (ELIQUIS) 5 MG TABS tablet Take 1 tablet (5 mg total) by mouth 2 (two) times daily. 09/24/19  Yes [name], MD  atorvastatin (LIPITOR) 20 MG tablet TAKE 1 TABLET(20 MG) BY MOUTH DAILY AT 6 PM Patient taking differently: Take 20 mg by mouth every evening.  03/13/19  Yes [name], MD  calcium carbonate (TUMS - DOSED IN MG ELEMENTAL CALCIUM) 500 MG chewable tablet Chew 1 tablet (200 mg of elemental calcium total) by mouth daily as needed for indigestion or heartburn. 03/12/19  Yes [name], MD  diazepam (VALIUM) 5 MG tablet Take 2.5-5 mg by mouth 3 (three) times daily as needed for anxiety.    Yes [provider]  digoxin (LANOXIN) 0.125 MG tablet Take 1 tablet (0.125 mg total) by mouth daily. 08/11/19  Yes Baldwin Jamaica, PA-C  docusate sodium (COLACE) 100 MG capsule Take 1 capsule (100 mg  total) by mouth 2 (two) times daily. 07/16/19  Yes Clegg, Amy D, NP  furosemide (LASIX) 40 MG tablet Take 40 mg by mouth daily as needed for fluid or edema.  10/06/19  Yes [provider]  gabapentin (NEURONTIN) 300 MG capsule Take 2 capsules (600 mg total) by mouth 3 (three) times daily. Patient taking differently: Take 300 mg by mouth 2 (two) times daily.  09/11/19  Yes Autry-Lott, Naaman Plummer, DO  ondansetron (ZOFRAN) 4 MG tablet Take 1 tablet (4 mg total) by mouth every 6 (six) hours as needed for nausea. 07/16/19  Yes Clegg, Amy D, NP  pantoprazole (PROTONIX) 40 MG tablet Take 1 tablet (40 mg total) by mouth daily. 07/16/19  Yes Clegg, Amy D, NP  potassium chloride SA (KLOR-CON) 20 MEQ tablet TAKE 2 TABLETS BY MOUTH ONCE DAILY FOR LOW POTASSIUM LEVELS Patient taking differently: Take 20 mEq by mouth daily.  09/28/19  Yes [name], MD  spironolactone (ALDACTONE) 25 MG tablet Take 25 mg by mouth daily.   Yes [provider]  amiodarone (PACERONE) 200 MG tablet Take 1 tablet (200 mg total) by mouth every 8 (eight) hours. Patient not taking: Reported on 12/26/2019 07/16/19   Darrick Grinder D, NP  levalbuterol (XOPENEX) 0.63 MG/3ML nebulizer solution Take 3 mLs (0.63 mg total) by nebulization every 6 (six) hours as needed for wheezing or shortness of breath. Patient not taking: Reported on 12/26/2019 07/16/19   Darrick Grinder D, NP  metoCLOPramide (REGLAN) 5 MG tablet Take 1 tablet (5 mg total) by mouth 3 (three) times daily. Patient not taking: Reported on 12/26/2019 07/16/19   Darrick Grinder D, NP  ranolazine (RANEXA) 500 MG 12 hr tablet TAKE 1 TABLET BY MOUTH EVERY 12 HOURS FOR ATRIAL FIBRILLATION Patient not taking: Reported on 12/26/2019 09/28/19   [name], MD  sertraline (ZOLOFT) 25 MG tablet Take 1 tablet (25 mg total) by mouth daily. Patient not taking: Reported on 12/26/2019 09/01/19   Leeanne Rio, MD   torsemide (DEMADEX) 20 MG tablet Take 2 tablets (40 mg total) by mouth 2 (two) times daily. Patient not taking: Reported on 12/26/2019 07/16/19   Conrad [place], NP   Allergies  Allergen Reactions  . Benadryl [Diphenhydramine] Palpitations   Review of Systems  Neurological: Positive for weakness.    Physical Exam Constitutional:      Appearance: He is underweight. He is ill-appearing.     Interventions: Nasal cannula in place.  Cardiovascular:     Rate and Rhythm: Normal rate.  Musculoskeletal:     Comments: Generalized weakness and muscle atrophy  Skin:    General: Skin is warm and dry.  Neurological:     Mental Status: He is alert.     Vital Signs: BP 114/69   Pulse 66   Temp 97.6 F (36.4 C) (Oral)   Resp 16   Ht 5\' 7"  (1.702 m)   Wt 75.5 kg   SpO2 96%   BMI 26.07 kg/m  Pain Scale: 0-10 POSS *See Group Information*: 1-Acceptable,Awake and alert Pain Score: 0-No pain   SpO2: SpO2: 96 % O2 Device:SpO2: 96 % O2 Flow Rate: .O2 Flow Rate (L/min): 2 L/min  IO: Intake/output summary:   Intake/Output Summary (Last 24 hours) at 01/13/2020 1016 Last data filed at 01/13/2020 0900 Gross per 24 hour  Intake 300 ml  Output 1800 ml  Net -1500 ml    LBM: Last BM Date: 01/11/20 (per pt RN did not witness) Baseline Weight: Weight:  75.8 kg Most recent weight: Weight: 75.5 kg     Palliative Assessment/Data:  40 %     Time In: 1300 Time Out: 1410 Time Total: 70 minutes Greater than 50%  of this time was spent counseling and coordinating care related to the above assessment and plan.  Signed by: Wadie Lessen, NP   Please contact Palliative Medicine Team phone at [phone] for questions and concerns.  For individual provider: See Shea Evans

## 2020-01-13 NOTE — Progress Notes (Signed)
Patient ID: Glenn [name], male   DOB: [birth year], 69 y.o.   MRN: [id number]     Advanced Heart Failure Rounding Note  PCP-Cardiologist: Virl Axe, MD   Subjective:    PICC placed 11/6 with initial CO-OX 31% and started Milrinone 0.25. On 11/8 txf ICU due to Co-ox remaining 30-40% on milrinone & started NE.  11/10: Code Stroke for L facial droop w/ slurred speech. Ultimately switch off milrinone and placed on DBA for persistently low co-ox.  Off NE 11/11. DC-CV 11/6.   Back on dobutamine 3. Co-ox 62%. Feels much better. Creatinine down to 2.35. Denies SOB, orthopnea or PND.   Objective:   Weight Range: 75.5 kg Body mass index is 26.07 kg/m.   Vital Signs:   Temp:  [97.6 F (36.4 C)-98.3 F (36.8 C)] 97.6 F (36.4 C) (11/24 0354) Pulse Rate:  [70-79] 70 (11/24 0734) Resp:  [18-20] 18 (11/24 0734) BP: (114-130)/(51-67) 130/67 (11/24 0734) SpO2:  [97 %-100 %] 97 % (11/24 0354) Weight:  [75.5 kg] 75.5 kg (11/24 0354) Last BM Date: 01/11/20 (per pt RN did not witness)  Weight change: Filed Weights   01/11/20 0407 01/12/20 0500 01/13/20 0354  Weight: 74.4 kg 75.3 kg 75.5 kg    Intake/Output:   Intake/Output Summary (Last 24 hours) at 01/13/2020 0818 Last data filed at 01/13/2020 0356 Gross per 24 hour  Intake --  Output 1800 ml  Net -1800 ml      Physical Exam   CVP 10 General:  Lying flat. No resp difficulty HEENT: normal Neck: supple. JVP 10. Carotids 2+ bilat; no bruits. No lymphadenopathy or thryomegaly appreciated. Cor: PMI nondisplaced. Regular rate & rhythm. No rubs, gallops or murmurs. Lungs: clear Abdomen: soft, nontender, nondistended. No hepatosplenomegaly. No bruits or masses. Good bowel sounds. Extremities: no cyanosis, clubbing, rash, edema Neuro: alert & orientedx3, cranial nerves grossly intact. moves all 4 extremities w/o difficulty. Affect pleasant   Telemetry   NSR Rates 70-80s.  Personally reviewed  Labs    CBC Recent Labs     01/12/20 0530  WBC 4.7  HGB 8.3*  HCT 26.3*  MCV 93.6  PLT 505   Basic Metabolic Panel Recent Labs    01/11/20 0416 01/11/20 0416 01/12/20 0530 01/13/20 0500  NA 135   < > 134* 135  K 4.1   < > 3.7 3.9  CL 96*   < > 96* 96*  CO2 29   < > 27 28  GLUCOSE 108*   < > 121* 124*  BUN 37*   < > 32* 27*  CREATININE 2.90*   < > 2.56* 2.35*  CALCIUM 9.3   < > 9.0 9.1  MG 2.1  --   --   --    < > = values in this interval not displayed.   Liver Function Tests No results for input(s): AST, ALT, ALKPHOS, BILITOT, PROT, ALBUMIN in the last 72 hours. No results for input(s): LIPASE, AMYLASE in the last 72 hours. Cardiac Enzymes No results for input(s): CKTOTAL, CKMB, CKMBINDEX, TROPONINI in the last 72 hours.  BNP: BNP (last 3 results) Recent Labs    06/01/19 1413 06/23/19 1527 12/25/19 1757  BNP 531.4* 613.2* 776.7*    ProBNP (last 3 results) No results for input(s): PROBNP in the last 8760 hours.   D-Dimer No results for input(s): DDIMER in the last 72 hours. Hemoglobin A1C No results for input(s): HGBA1C in the last 72 hours. Fasting Lipid Panel No results for input(s):  CHOL, HDL, LDLCALC, TRIG, CHOLHDL, LDLDIRECT in the last 72 hours. Thyroid Function Tests No results for input(s): TSH, T4TOTAL, T3FREE, THYROIDAB in the last 72 hours.  Invalid input(s): FREET3  Other results:   Imaging    No results found.   Medications:     Scheduled Medications: . [MAR Hold] amiodarone  200 mg Oral BID  . [MAR Hold] apixaban  5 mg Oral BID  . [MAR Hold] atorvastatin  40 mg Oral QPM  . [MAR Hold] Chlorhexidine Gluconate Cloth  6 each Topical Daily  . [MAR Hold] docusate sodium  100 mg Oral BID  . [MAR Hold] feeding supplement  237 mL Oral TID BM  . [MAR Hold] gabapentin  300 mg Oral BID  . [MAR Hold] insulin aspart  0-9 Units Subcutaneous TID WC  . [MAR Hold] melatonin  3 mg Oral QHS  . [MAR Hold] pantoprazole  40 mg Oral Daily  . [MAR Hold] sodium chloride flush   10-40 mL Intracatheter Q12H  . [MAR Hold] sodium chloride flush  3 mL Intravenous Q12H    Infusions: . sodium chloride    . sodium chloride 10 mL/hr at 01/13/20 3710  . DOBUTamine 3 mcg/kg/min (01/11/20 1029)    PRN Medications: sodium chloride, [MAR Hold] acetaminophen, [MAR Hold] ALPRAZolam, [MAR Hold] calcium carbonate, Heparin (Porcine) in NaCl, [MAR Hold] ondansetron (ZOFRAN) IV, [MAR Hold] polyethylene glycol, [MAR Hold] sodium chloride flush, sodium chloride flush    Patient Profile  69 y.o. male with a hx of chronic systolic HF 2/2 NICM s/p BSci ICD, persistent atrial fibrillation, h/o stroke (Lg L MCA infarct w/ hemorrhagic conversion in 2018), HTN, HCV, tobacco abuse (smokes ~1/4 ppd),  and chronic cholecystitis with a chronic perc drain.   Admitted with ADHF. Started on milrinone>> later transitioned to DBA for persistently low co-ox.    Assessment/Plan   1. Acute on chronic systolic biventricular  HF due to NICM -> cardiogenic shock - suspect AF cardiomyopathy - ICD placed Nov 2015 - [facility] 10/2018 showed normal cors, elevated filling pressures and low CI at 1.9  - TTE 11/2018 LVEF 20%, mild RV dysfunction - Admit 06/2019 with severe HF, required IABP and milrinone. TEE 5/21 EF 20-25% with moderate to severe RV dysfunction.  - Admitted 12/25/2019 with ADHF. Milrinone 0.25 mcg started 11/7 due to low output HF. Echo 12/28/19 EF 20-25% RV mildly decreased. Moved to ICU on 11/8 due to persistently low co-ox on milrinone. 12/29/19: Continued w/ persistently low co-ox despite milrinone 0.5 and NE which was stopped d/t elevated MAPs. Gave metolazone 2/2 low UOP. Transitioned to dobutamine gtt starting at 2.5 mcg then increased to 3 mcg. Required NE but has been off since 11/11.  - Suspect RV function worse than it looks on echo.  - Back on dobutamine 3  CVP 10 co-ox 62% - Continue with dobutamine 3. Failed wean 2x with dropping co-ox and rising creatinine - RHC today.  - No beta  blockers due to low output HF - No dig or ACE/ARB/ARNI with worsening renal function.  - Options remain very limited - poor LVAD candidate with noncompliance and renal failure.  - Plan home with dobtuamine. PICC in place - Main issue seems to be RV failure. Hard to know what endpoint will be a this time. Hopefully will have some EF improvement with maintaining NSR.    2. Concerning for acute ischemic R MCA -hx of large R MCA with hemorrhagic conversion in Sept 2020 & L MCA s/p thrombectomy M2  occlusion in 2018.  -11/10 around 14:00 developed new L facial droop, LUE weakness and slurred speech - Code Stroke called.  Neuro recs appreciated, did not tolerate change in ICD settings for MRI/MRA; Repeat CT on 11/12 no acute findings, prior remote R MCA infarct. - A1c 4.6%, LDL 63, HDL 26, Cholesterol 103, Triglycerides 68 - Atrovastatin dose increased from home dose.  - No change  3.  Persistent Afib with RVR - In 5/21 had refractory AF requiring multiple DC-CVs, high-dose amio and ranexa.  -He reported intermittent compliance with amio and Eliquis. Out of eliquis prior to admission. Transitioned from eliquis to heparin gtt due to possible need for procedures on 11/9 and planned for TEE DC-CV was scheduled 11/10, however canceled due to new neuro symptoms. - Stopped ranexa on 11/9. - DCCV 11/17 converted to SR. - Remains in NSR - Now on po amio 100 bid - Continue with eliquis 5 mg BID.  - No bleeding.    4. Acute on CKD IIIb - Baseline Cr looks to be around 1.6 to 1.7 - Admission Cr 1.69 and Peak Cr 2.9 -Today creatinine 2.35 - Likely ATN/cardiorenal - Continue to monitor BMET daily   5. Elevated troponin - 10/2018 cath clean coronaries - elevated trops due to afib with RVR, CHF - Episode of chest pain and shortness of breath that resolved last night.  EKG obtained no ST changes.    6. Chronic cholecystitis with perc drain -  Had some drainage yesterday. [facility] has seen. Does not think  infected    7. Iron deficiency anemia - Iron sats low and has received feraheme. - Hgb stable    8. Depression - Watch QT closely on amio.  - Stopped zoloft & atarax 11/10 d/t prolong QT.   - Ranexa stopped 11/9.  -EKG 11/19 QTc 504   Glori Bickers, MD  8:18 AM

## 2020-01-13 NOTE — H&P (View-Only) (Signed)
Patient ID: LATRON RIBAS, male   DOB: 08-20-1950, 69 y.o.   MRN: [id number]     Advanced Heart Failure Rounding Note  PCP-Cardiologist: Virl Axe, MD   Subjective:    PICC placed 11/6 with initial CO-OX 31% and started Milrinone 0.25. On 11/8 txf ICU due to Co-ox remaining 30-40% on milrinone & started NE.  11/10: Code Stroke for L facial droop w/ slurred speech. Ultimately switch off milrinone and placed on DBA for persistently low co-ox.  Off NE 11/11. DC-CV 11/6.   Back on dobutamine 3. Co-ox 62%. Feels much better. Creatinine down to 2.35. Denies SOB, orthopnea or PND.   Objective:   Weight Range: 75.5 kg Body mass index is 26.07 kg/m.   Vital Signs:   Temp:  [97.6 F (36.4 C)-98.3 F (36.8 C)] 97.6 F (36.4 C) (11/24 0354) Pulse Rate:  [70-79] 70 (11/24 0734) Resp:  [18-20] 18 (11/24 0734) BP: (114-130)/(51-67) 130/67 (11/24 0734) SpO2:  [97 %-100 %] 97 % (11/24 0354) Weight:  [75.5 kg] 75.5 kg (11/24 0354) Last BM Date: 01/11/20 (per pt RN did not witness)  Weight change: Filed Weights   01/11/20 0407 01/12/20 0500 01/13/20 0354  Weight: 74.4 kg 75.3 kg 75.5 kg    Intake/Output:   Intake/Output Summary (Last 24 hours) at 01/13/2020 0818 Last data filed at 01/13/2020 0356 Gross per 24 hour  Intake --  Output 1800 ml  Net -1800 ml      Physical Exam   CVP 10 General:  Lying flat. No resp difficulty HEENT: normal Neck: supple. JVP 10. Carotids 2+ bilat; no bruits. No lymphadenopathy or thryomegaly appreciated. Cor: PMI nondisplaced. Regular rate & rhythm. No rubs, gallops or murmurs. Lungs: clear Abdomen: soft, nontender, nondistended. No hepatosplenomegaly. No bruits or masses. Good bowel sounds. Extremities: no cyanosis, clubbing, rash, edema Neuro: alert & orientedx3, cranial nerves grossly intact. moves all 4 extremities w/o difficulty. Affect pleasant   Telemetry   NSR Rates 70-80s.  Personally reviewed  Labs    CBC Recent Labs     01/12/20 0530  WBC 4.7  HGB 8.3*  HCT 26.3*  MCV 93.6  PLT 672   Basic Metabolic Panel Recent Labs    01/11/20 0416 01/11/20 0416 01/12/20 0530 01/13/20 0500  NA 135   < > 134* 135  K 4.1   < > 3.7 3.9  CL 96*   < > 96* 96*  CO2 29   < > 27 28  GLUCOSE 108*   < > 121* 124*  BUN 37*   < > 32* 27*  CREATININE 2.90*   < > 2.56* 2.35*  CALCIUM 9.3   < > 9.0 9.1  MG 2.1  --   --   --    < > = values in this interval not displayed.   Liver Function Tests No results for input(s): AST, ALT, ALKPHOS, BILITOT, PROT, ALBUMIN in the last 72 hours. No results for input(s): LIPASE, AMYLASE in the last 72 hours. Cardiac Enzymes No results for input(s): CKTOTAL, CKMB, CKMBINDEX, TROPONINI in the last 72 hours.  BNP: BNP (last 3 results) Recent Labs    06/01/19 1413 06/23/19 1527 12/25/19 1757  BNP 531.4* 613.2* 776.7*    ProBNP (last 3 results) No results for input(s): PROBNP in the last 8760 hours.   D-Dimer No results for input(s): DDIMER in the last 72 hours. Hemoglobin A1C No results for input(s): HGBA1C in the last 72 hours. Fasting Lipid Panel No results for input(s):  CHOL, HDL, LDLCALC, TRIG, CHOLHDL, LDLDIRECT in the last 72 hours. Thyroid Function Tests No results for input(s): TSH, T4TOTAL, T3FREE, THYROIDAB in the last 72 hours.  Invalid input(s): FREET3  Other results:   Imaging    No results found.   Medications:     Scheduled Medications: . [MAR Hold] amiodarone  200 mg Oral BID  . [MAR Hold] apixaban  5 mg Oral BID  . [MAR Hold] atorvastatin  40 mg Oral QPM  . [MAR Hold] Chlorhexidine Gluconate Cloth  6 each Topical Daily  . [MAR Hold] docusate sodium  100 mg Oral BID  . [MAR Hold] feeding supplement  237 mL Oral TID BM  . [MAR Hold] gabapentin  300 mg Oral BID  . [MAR Hold] insulin aspart  0-9 Units Subcutaneous TID WC  . [MAR Hold] melatonin  3 mg Oral QHS  . [MAR Hold] pantoprazole  40 mg Oral Daily  . [MAR Hold] sodium chloride flush   10-40 mL Intracatheter Q12H  . [MAR Hold] sodium chloride flush  3 mL Intravenous Q12H    Infusions: . sodium chloride    . sodium chloride 10 mL/hr at 01/13/20 5643  . DOBUTamine 3 mcg/kg/min (01/11/20 1029)    PRN Medications: sodium chloride, [MAR Hold] acetaminophen, [MAR Hold] ALPRAZolam, [MAR Hold] calcium carbonate, Heparin (Porcine) in NaCl, [MAR Hold] ondansetron (ZOFRAN) IV, [MAR Hold] polyethylene glycol, [MAR Hold] sodium chloride flush, sodium chloride flush    Patient Profile  69 y.o. male with a hx of chronic systolic HF 2/2 NICM s/p BSci ICD, persistent atrial fibrillation, h/o stroke (Lg L MCA infarct w/ hemorrhagic conversion in 2018), HTN, HCV, tobacco abuse (smokes ~1/4 ppd),  and chronic cholecystitis with a chronic perc drain.   Admitted with ADHF. Started on milrinone>> later transitioned to DBA for persistently low co-ox.    Assessment/Plan   1. Acute on chronic systolic biventricular  HF due to NICM -> cardiogenic shock - suspect AF cardiomyopathy - ICD placed Nov 2015 - [facility] 10/2018 showed normal cors, elevated filling pressures and low CI at 1.9  - TTE 11/2018 LVEF 20%, mild RV dysfunction - Admit 06/2019 with severe HF, required IABP and milrinone. TEE 5/21 EF 20-25% with moderate to severe RV dysfunction.  - Admitted 12/25/2019 with ADHF. Milrinone 0.25 mcg started 11/7 due to low output HF. Echo 12/28/19 EF 20-25% RV mildly decreased. Moved to ICU on 11/8 due to persistently low co-ox on milrinone. 12/29/19: Continued w/ persistently low co-ox despite milrinone 0.5 and NE which was stopped d/t elevated MAPs. Gave metolazone 2/2 low UOP. Transitioned to dobutamine gtt starting at 2.5 mcg then increased to 3 mcg. Required NE but has been off since 11/11.  - Suspect RV function worse than it looks on echo.  - Back on dobutamine 3  CVP 10 co-ox 62% - Continue with dobutamine 3. Failed wean 2x with dropping co-ox and rising creatinine - RHC today.  - No beta  blockers due to low output HF - No dig or ACE/ARB/ARNI with worsening renal function.  - Options remain very limited - poor LVAD candidate with noncompliance and renal failure.  - Plan home with dobtuamine. PICC in place - Main issue seems to be RV failure. Hard to know what endpoint will be a this time. Hopefully will have some EF improvement with maintaining NSR.    2. Concerning for acute ischemic R MCA -hx of large R MCA with hemorrhagic conversion in Sept 2020 & L MCA s/p thrombectomy M2  occlusion in 2018.  -11/10 around 14:00 developed new L facial droop, LUE weakness and slurred speech - Code Stroke called.  Neuro recs appreciated, did not tolerate change in ICD settings for MRI/MRA; Repeat CT on 11/12 no acute findings, prior remote R MCA infarct. - A1c 4.6%, LDL 63, HDL 26, Cholesterol 103, Triglycerides 68 - Atrovastatin dose increased from home dose.  - No change  3.  Persistent Afib with RVR - In 5/21 had refractory AF requiring multiple DC-CVs, high-dose amio and ranexa.  -He reported intermittent compliance with amio and Eliquis. Out of eliquis prior to admission. Transitioned from eliquis to heparin gtt due to possible need for procedures on 11/9 and planned for TEE DC-CV was scheduled 11/10, however canceled due to new neuro symptoms. - Stopped ranexa on 11/9. - DCCV 11/17 converted to SR. - Remains in NSR - Now on po amio 100 bid - Continue with eliquis 5 mg BID.  - No bleeding.    4. Acute on CKD IIIb - Baseline Cr looks to be around 1.6 to 1.7 - Admission Cr 1.69 and Peak Cr 2.9 -Today creatinine 2.35 - Likely ATN/cardiorenal - Continue to monitor BMET daily   5. Elevated troponin - 10/2018 cath clean coronaries - elevated trops due to afib with RVR, CHF - Episode of chest pain and shortness of breath that resolved last night.  EKG obtained no ST changes.    6. Chronic cholecystitis with perc drain -  Had some drainage yesterday. GSU has seen. Does not think  infected    7. Iron deficiency anemia - Iron sats low and has received feraheme. - Hgb stable    8. Depression - Watch QT closely on amio.  - Stopped zoloft & atarax 11/10 d/t prolong QT.   - Ranexa stopped 11/9.  -EKG 11/19 QTc 504   Glori Bickers, MD  8:18 AM

## 2020-01-13 NOTE — Interval H&P Note (Signed)
History and Physical Interval Note:  01/13/2020 8:24 AM  Glenn [name]  has presented today for surgery, with the diagnosis of heart failure.  The various methods of treatment have been discussed with the patient and family. After consideration of risks, benefits and other options for treatment, the patient has consented to  Procedure(s): RIGHT HEART CATH (N/A) as a surgical intervention.  The patient's history has been reviewed, patient examined, no change in status, stable for surgery.  I have reviewed the patient's chart and labs.  Questions were answered to the patient's satisfaction.     [name]

## 2020-01-14 ENCOUNTER — Inpatient Hospital Stay (HOSPITAL_COMMUNITY): Payer: Medicare HMO

## 2020-01-14 DIAGNOSIS — I4819 Other persistent atrial fibrillation: Secondary | ICD-10-CM | POA: Diagnosis not present

## 2020-01-14 LAB — BASIC METABOLIC PANEL
Anion gap: 9 (ref 5–15)
BUN: 29 mg/dL — ABNORMAL HIGH (ref 8–23)
CO2: 27 mmol/L (ref 22–32)
Calcium: 8.7 mg/dL — ABNORMAL LOW (ref 8.9–10.3)
Chloride: 103 mmol/L (ref 98–111)
Creatinine, Ser: 2.08 mg/dL — ABNORMAL HIGH (ref 0.61–1.24)
GFR, Estimated: 34 mL/min — ABNORMAL LOW (ref >60)
Glucose, Bld: 123 mg/dL — ABNORMAL HIGH (ref 70–99)
Potassium: 3.7 mmol/L (ref 3.5–5.1)
Sodium: 139 mmol/L (ref 135–145)

## 2020-01-14 LAB — COOXEMETRY PANEL
Carboxyhemoglobin: 2.4 % — ABNORMAL HIGH (ref 0.5–1.5)
Methemoglobin: 0.8 % (ref 0.0–1.5)
O2 Saturation: 71.3 %
Total hemoglobin: 8.7 g/dL — ABNORMAL LOW (ref 12.0–16.0)

## 2020-01-14 LAB — GLUCOSE, CAPILLARY
Glucose-Capillary: 102 mg/dL — ABNORMAL HIGH (ref 70–99)
Glucose-Capillary: 100 mg/dL — ABNORMAL HIGH (ref 70–99)
Glucose-Capillary: 130 mg/dL — ABNORMAL HIGH (ref 70–99)

## 2020-01-14 LAB — MAGNESIUM: Magnesium: 2 mg/dL (ref 1.7–2.4)

## 2020-01-14 MED ORDER — PIPERACILLIN-TAZOBACTAM 3.375 G IVPB
3.3750 g | Freq: Three times a day (TID) | INTRAVENOUS | Status: DC
  Administered 2020-01-14 – 2020-01-15 (×2): 3.375 g via INTRAVENOUS
  Filled 2020-01-14 (×3): qty 50

## 2020-01-14 MED ORDER — AMIODARONE IV BOLUS ONLY 150 MG/100ML
150.0000 mg | Freq: Once | INTRAVENOUS | Status: AC
  Administered 2020-01-14: 150 mg via INTRAVENOUS
  Filled 2020-01-14: qty 100

## 2020-01-14 MED ORDER — POTASSIUM CHLORIDE CRYS ER 20 MEQ PO TBCR
20.0000 meq | EXTENDED_RELEASE_TABLET | Freq: Once | ORAL | Status: AC
  Administered 2020-01-14: 20 meq via ORAL
  Filled 2020-01-14: qty 1

## 2020-01-14 MED FILL — Heparin Sod (Porcine)-NaCl IV Soln 1000 Unit/500ML-0.9%: INTRAVENOUS | Qty: 1000 | Status: AC

## 2020-01-14 NOTE — Progress Notes (Signed)
Patient ID: Glenn [name], male   DOB: [birth year], 69 y.o.   MRN: [id number]     Advanced Heart Failure Rounding Note  PCP-Cardiologist: Virl Axe, MD   Subjective:    Remains on dobutamine 3. Co-ox 62% -> 71% . Feels much better. Creatinine down to 2.09. Denies SOB, orthopnea or PND.   Back in AF today 70-90s. Having increased drainage from perc tube tract. No fevers or chills.   Objective:   Weight Range: 74.8 kg Body mass index is 25.84 kg/m.   Vital Signs:   Temp:  [97.6 F (36.4 C)-98.4 F (36.9 C)] 98.4 F (36.9 C) (11/25 0734) Pulse Rate:  [65-80] 77 (11/25 0734) Resp:  [13-20] 20 (11/25 0734) BP: (113-145)/(59-77) 145/77 (11/25 0734) SpO2:  [97 %-100 %] 100 % (11/25 0734) Weight:  [74.8 kg] 74.8 kg (11/25 0333) Last BM Date: 01/11/20  Weight change: Filed Weights   01/12/20 0500 01/13/20 0354 01/14/20 0333  Weight: 75.3 kg 75.5 kg 74.8 kg    Intake/Output:   Intake/Output Summary (Last 24 hours) at 01/14/2020 1005 Last data filed at 01/14/2020 0947 Gross per 24 hour  Intake 2135 ml  Output 1100 ml  Net 1035 ml      Physical Exam   CVP 9-10 General:  Lying flat. No resp difficulty HEENT: normal Neck: supple. JVP 9-10. Carotids 2+ bilat; no bruits. No lymphadenopathy or thryomegaly appreciated. Cor: PMI nondisplaced. Regular rate & rhythm. No rubs, gallops or murmurs. Lungs: clear Abdomen: soft, nontender, nondistended. No hepatosplenomegaly. No bruits or masses. Good bowel sounds. Dressing on tract site with purulent drainage Extremities: no cyanosis, clubbing, rash, edema Neuro: alert & orientedx3, cranial nerves grossly intact. moves all 4 extremities w/o difficulty. Affect pleasant   Telemetry   AF 70-90s Personally reviewed  Labs    CBC Recent Labs    01/12/20 0530 01/13/20 0841  WBC 4.7  --   HGB 8.3* 8.5*  9.2*  HCT 26.3* 25.0*  27.0*  MCV 93.6  --   PLT 171  --    Basic Metabolic Panel Recent Labs    01/13/20 0500  01/13/20 0500 01/13/20 0841 01/14/20 0500  NA 135   < > 143  140 139  K 3.9   < > 3.4*  3.8 3.7  CL 96*  --   --  103  CO2 28  --   --  27  GLUCOSE 124*  --   --  123*  BUN 27*  --   --  29*  CREATININE 2.35*  --   --  2.08*  CALCIUM 9.1  --   --  8.7*  MG  --   --   --  2.0   < > = values in this interval not displayed.   Liver Function Tests No results for input(s): AST, ALT, ALKPHOS, BILITOT, PROT, ALBUMIN in the last 72 hours. No results for input(s): LIPASE, AMYLASE in the last 72 hours. Cardiac Enzymes No results for input(s): CKTOTAL, CKMB, CKMBINDEX, TROPONINI in the last 72 hours.  BNP: BNP (last 3 results) Recent Labs    06/01/19 1413 06/23/19 1527 12/25/19 1757  BNP 531.4* 613.2* 776.7*    ProBNP (last 3 results) No results for input(s): PROBNP in the last 8760 hours.   D-Dimer No results for input(s): DDIMER in the last 72 hours. Hemoglobin A1C No results for input(s): HGBA1C in the last 72 hours. Fasting Lipid Panel No results for input(s): CHOL, HDL, LDLCALC, TRIG, CHOLHDL, LDLDIRECT in the  last 72 hours. Thyroid Function Tests No results for input(s): TSH, T4TOTAL, T3FREE, THYROIDAB in the last 72 hours.  Invalid input(s): FREET3  Other results:   Imaging    No results found.   Medications:     Scheduled Medications: . amiodarone  200 mg Oral BID  . apixaban  5 mg Oral BID  . atorvastatin  40 mg Oral QPM  . Chlorhexidine Gluconate Cloth  6 each Topical Daily  . docusate sodium  100 mg Oral BID  . feeding supplement  237 mL Oral TID BM  . gabapentin  300 mg Oral BID  . insulin aspart  0-9 Units Subcutaneous TID WC  . melatonin  3 mg Oral QHS  . pantoprazole  40 mg Oral Daily  . sodium chloride flush  10-40 mL Intracatheter Q12H  . sodium chloride flush  3 mL Intravenous Q12H  . torsemide  60 mg Oral Daily    Infusions: . DOBUTamine 3 mcg/kg/min (01/11/20 1029)    PRN Medications: acetaminophen, ALPRAZolam, calcium  carbonate, ondansetron (ZOFRAN) IV, polyethylene glycol, sodium chloride flush    Patient Profile  69 y.o. male with a hx of chronic systolic HF 2/2 NICM s/p BSci ICD, persistent atrial fibrillation, h/o stroke (Lg L MCA infarct w/ hemorrhagic conversion in 2018), HTN, HCV, tobacco abuse (smokes ~1/4 ppd),  and chronic cholecystitis with a chronic perc drain.   Admitted with ADHF. Started on milrinone>> later transitioned to DBA for persistently low co-ox.    Assessment/Plan   1. Acute on chronic systolic biventricular  HF due to NICM -> cardiogenic shock - suspect AF cardiomyopathy - ICD placed Nov 2015 - [facility] 10/2018 showed normal cors, elevated filling pressures and low CI at 1.9  - TTE 11/2018 LVEF 20%, mild RV dysfunction - Admit 06/2019 with severe HF, required IABP and milrinone. TEE 5/21 EF 20-25% with moderate to severe RV dysfunction.  - Admitted 12/25/2019 with ADHF. Milrinone 0.25 mcg started 11/7 due to low output HF. Echo 12/28/19 EF 20-25% RV mildly decreased. Moved to ICU on 11/8 due to persistently low co-ox on milrinone. 12/29/19: Continued w/ persistently low co-ox despite milrinone 0.5 and NE which was stopped d/t elevated MAPs. Gave metolazone 2/2 low UOP. Transitioned to dobutamine gtt starting at 2.5 mcg then increased to 3 mcg. Required NE but has been off since 11/11.  - Suspect RV function worse than it looks on echo.  - Back on dobutamine 3  CVP 9-10 co-ox 71% -> renal function improving. Will continue current regimen.  - Volume status good on torsemide 60 daily.  - Continue with dobutamine 3. Failed wean 2x with dropping co-ox and rising creatinine - No beta blockers due to low output HF - No dig or ACE/ARB/ARNI with worsening renal function.  - Options remain very limited - poor LVAD candidate with noncompliance and renal failure.  - Plan home with dobtuamine. PICC in place - Main issue seems to be RV failure. Hard to know what endpoint will be a this time.  Hopefully will have some EF improvement with maintaining NSR.    2. Concerning for acute ischemic R MCA -hx of large R MCA with hemorrhagic conversion in Sept 2020 & L MCA s/p thrombectomy M2 occlusion in 2018.  -11/10 around 14:00 developed new L facial droop, LUE weakness and slurred speech - Code Stroke called.  Neuro recs appreciated, did not tolerate change in ICD settings for MRI/MRA; Repeat CT on 11/12 no acute findings, prior remote R MCA infarct. -  A1c 4.6%, LDL 63, HDL 26, Cholesterol 103, Triglycerides 68 - Atrovastatin dose increased from home dose.  - No change  3.  Persistent Afib with RVR - In 5/21 had refractory AF requiring multiple DC-CVs, high-dose amio and ranexa.  -He reported intermittent compliance with amio and Eliquis. Out of eliquis prior to admission. Transitioned from eliquis to heparin gtt due to possible need for procedures on 11/9 and planned for TEE DC-CV was scheduled 11/10, however canceled due to new neuro symptoms. - Stopped ranexa on 11/9. - DCCV 11/17 converted to SR. - Back in AF this am. Rate controlled.  - Continue amio 200 bid. Will give one IV bolus - Plan repeat DC-CV tomorrow if anesthesia able to assist - Not candidate for AV node ablation CRT currently with possible infection of GB drain tract - Continue with eliquis 5 mg BID.  - No bleeding.    4. Acute on CKD IIIb - Baseline Cr looks to be around 1.6 to 1.7 - Admission Cr 1.69 and Peak Cr 2.9 -Today creatinine 2.35 -> 2.08 - Likely ATN/cardiorenal - Continue dobutamine at 4 for supprot - Continue to monitor BMET daily   5. Elevated troponin - 10/2018 cath clean coronaries - elevated trops due to afib with RVR, CHF - Episode of chest pain and shortness of breath that resolved last night.  EKG obtained no ST changes.    6. Chronic cholecystitis with perc drain -  Continues with increasing drainage. GSU has seen. Does not think infected. I disagree.  - Will culture drainage and get CT  to rule out tract abscess - low threshold to add abx   7. Iron deficiency anemia - Iron sats low and has received feraheme. - Hgb stable      Glori Bickers, MD  10:05 AM

## 2020-01-14 NOTE — Progress Notes (Signed)
Pharmacy Antibiotic Note  Glenn [name] is a 69 y.o. male admitted on 12/25/2019 with SOB and dizziness.  Pharmacy has been consulted for Zosyn dosing for wound infection.  SCr down 2.08, CrCL 32 ml/min, afebrile, WBC WNL.  Plan: Zosyn EID 3.375gm IV Q8H Monitor renal fxn, micro data  Height: 5\' 7"  (170.2 cm) Weight: 74.8 kg (165 lb) IBW/kg (Calculated) : 66.1  Temp (24hrs), Avg:98.1 F (36.7 C), Min:97.8 F (36.6 C), Max:98.4 F (36.9 C)  Recent Labs  Lab 01/08/20 0500 01/09/20 0547 01/10/20 0455 01/11/20 0416 01/12/20 0530 01/13/20 0500 01/14/20 0500  WBC 7.0  --   --   --  4.7  --   --   CREATININE 2.70*   < > 2.77* 2.90* 2.56* 2.35* 2.08*   < > = values in this interval not displayed.    Estimated Creatinine Clearance: 32.2 mL/min (A) (by C-G formula based on SCr of 2.08 mg/dL (H)).    Allergies  Allergen Reactions  . Benadryl [Diphenhydramine] Palpitations    Zosyn 11/25 >>   11/11 BCx - negative 11/13 MRSA PCR - negative 11/23 abd wound - GPC on Gram stain  Eller Sweis D. Mina Marble, PharmD, BCPS, [place] 01/14/2020, 9:41 PM

## 2020-01-15 DIAGNOSIS — I4819 Other persistent atrial fibrillation: Secondary | ICD-10-CM | POA: Diagnosis not present

## 2020-01-15 DIAGNOSIS — R531 Weakness: Secondary | ICD-10-CM

## 2020-01-15 LAB — GLUCOSE, CAPILLARY
Glucose-Capillary: 122 mg/dL — ABNORMAL HIGH (ref 70–99)
Glucose-Capillary: 87 mg/dL (ref 70–99)
Glucose-Capillary: 98 mg/dL (ref 70–99)
Glucose-Capillary: 91 mg/dL (ref 70–99)

## 2020-01-15 LAB — BASIC METABOLIC PANEL
Anion gap: 11 (ref 5–15)
BUN: 26 mg/dL — ABNORMAL HIGH (ref 8–23)
CO2: 27 mmol/L (ref 22–32)
Calcium: 9.1 mg/dL (ref 8.9–10.3)
Chloride: 100 mmol/L (ref 98–111)
Creatinine, Ser: 2.22 mg/dL — ABNORMAL HIGH (ref 0.61–1.24)
GFR, Estimated: 32 mL/min — ABNORMAL LOW (ref >60)
Glucose, Bld: 120 mg/dL — ABNORMAL HIGH (ref 70–99)
Potassium: 4.2 mmol/L (ref 3.5–5.1)
Sodium: 138 mmol/L (ref 135–145)

## 2020-01-15 LAB — CBC
HCT: 28.3 % — ABNORMAL LOW (ref 39.0–52.0)
Hemoglobin: 8.7 g/dL — ABNORMAL LOW (ref 13.0–17.0)
MCH: 29.4 pg (ref 26.0–34.0)
MCHC: 30.7 g/dL (ref 30.0–36.0)
MCV: 95.6 fL (ref 80.0–100.0)
Platelets: 186 10*3/uL (ref 150–400)
RBC: 2.96 MIL/uL — ABNORMAL LOW (ref 4.22–5.81)
RDW: 17.9 % — ABNORMAL HIGH (ref 11.5–15.5)
WBC: 5.5 10*3/uL (ref 4.0–10.5)
nRBC: 0 % (ref 0.0–0.2)

## 2020-01-15 LAB — COOXEMETRY PANEL
Carboxyhemoglobin: 1.7 % — ABNORMAL HIGH (ref 0.5–1.5)
Carboxyhemoglobin: 1.9 % — ABNORMAL HIGH (ref 0.5–1.5)
Methemoglobin: 0.8 % (ref 0.0–1.5)
Methemoglobin: 0.5 % (ref 0.0–1.5)
O2 Saturation: 50.4 %
O2 Saturation: 57.3 %
Total hemoglobin: 13.2 g/dL (ref 12.0–16.0)
Total hemoglobin: 12.7 g/dL (ref 12.0–16.0)

## 2020-01-15 NOTE — Progress Notes (Signed)
Patient ID: Glenn [name], male   DOB: Feb 28, 1950, 70 y.o.   MRN: [id number]  This NP visited patient at the bedside as a follow up for palliative medicine needs and emotional support.  Continued education regarding current medical situation.  Patient understands the seriousness of his current medical situation.  Education offered on his high risk of decompensation secondary to multiple comorbidities.  He is open to all offered and available medical interventions   He has documented HPOA paperwork.    Patient declined completion of MOST form today.  Left hardcopy with him for review.  Discussed with patient the importance of continued conversation with his family and the medical providers regarding overall plan of care and treatment options,  ensuring decisions are within the context of the patients values and GOCs.  Spoke to son [name] by telephone.  He to verbalizes an understanding of the seriousness of his father's medical conditions.  He is open to and hopeful for patient's return to his home here in [place], with [facility] support.   Recommend outpatient community-based palliative care services to follow.  Questions and concerns addressed       Total time spent on the unit was 25 minutes   Greater than 50% of the time was spent in counseling and coordination of care  Wadie Lessen NP  Palliative Medicine Team Team Phone # [phone] Pager [phone]

## 2020-01-15 NOTE — Progress Notes (Signed)
Occupational Therapy Treatment Patient Details Name: Glenn [name] MRN: [id number] DOB: [birth year] Today's Date: 01/15/2020    History of present illness Glenn [name] is a 69 y.o. male with a hx of chronic systolic HF 2/2 NICM s/p BSci ICD, atrial fibrillation, h/o stroke (Lg L MCA infarct w/ hemorrhagic conversion in 2018), h/o DVT, chornic a/c therapy, HTN, HLD, pulmonary nodule, Hepatitis C, carpal tunnel,  tobacco abuse (smokes ~1/4 ppd) and chronic cholecystitis with a chronic perc drain, who is being seen today for the evaluation of Afib RVR and CHF at the request of Dr Regenia Skeeter. Pt experienced onset of L side weakness on 12/30/19, CT negative for acute changes, pt cannot have an MRI.   OT comments  Patient continues to present with declines to safety, primarily as it relates to his lines and leads.  He is at a supervision level for all mobility and self care.  He is scheduled for cardioversion today.  OT will continue to follow in the acute setting.  HH has been recommended.    Follow Up Recommendations  Home health OT;Supervision - Intermittent    Equipment Recommendations  None recommended by OT    Recommendations for Other Services      Precautions / Restrictions Precautions Precautions: Fall Precaution Comments: watch HR       Mobility Bed Mobility                  Transfers   Equipment used: Rolling walker (2 wheeled) Transfers: Sit to/from Stand Sit to Stand: Supervision         General transfer comment: safety for lines and leads    Balance   Sitting-balance support: Feet supported;No upper extremity supported Sitting balance-Leahy Scale: Good     Standing balance support: Bilateral upper extremity supported Standing balance-Leahy Scale: Fair                             ADL either performed or assessed with clinical judgement   ADL       Grooming: Supervision/safety;Standing           Upper Body Dressing :  Supervision/safety;Standing   Lower Body Dressing: Sit to/from stand;Supervision/safety   Toilet Transfer: Supervision/safety;RW                                     Cognition Arousal/Alertness: Awake/alert Behavior During Therapy: WFL for tasks assessed/performed Overall Cognitive Status: Within Functional Limits for tasks assessed                                                      General Comments  HR 77 at conclusion    Pertinent Vitals/ Pain       Pain Assessment: No/denies pain Pain Intervention(s): Monitored during session                                                          Frequency  Min 2X/week        Progress Toward Goals  OT Goals(current goals  can now be found in the care plan section)        Plan Discharge plan remains appropriate    Co-evaluation                 AM-PAC OT "6 Clicks" Daily Activity     Outcome Measure   Help from another person eating meals?: None Help from another person taking care of personal grooming?: A Little Help from another person toileting, which includes using toliet, bedpan, or urinal?: A Little Help from another person bathing (including washing, rinsing, drying)?: A Little Help from another person to put on and taking off regular upper body clothing?: A Little Help from another person to put on and taking off regular lower body clothing?: A Little 6 Click Score: 19    End of Session Equipment Utilized During Treatment: Rolling walker;Gait belt  OT Visit Diagnosis: Other abnormalities of gait and mobility (R26.89);Unsteadiness on feet (R26.81);Muscle weakness (generalized) (M62.81);Hemiplegia and hemiparesis Hemiplegia - Right/Left: Left Hemiplegia - caused by: Cerebral infarction   Activity Tolerance Patient tolerated treatment well   Patient Left with call bell/phone within reach;Other (comment) (sitting edge of bed)   Nurse  Communication Other (comment) (patient concerned about bandage to R side)        Time: 9242-6834 OT Time Calculation (min): 16 min  Charges: OT General Charges $OT Visit: 1 Visit OT Treatments $Self Care/Home Management : 8-22 mins  01/15/2020  Rich, OTR/L  Acute Rehabilitation Services  Office:  [phone]    Metta Clines 01/15/2020, 8:56 AM

## 2020-01-15 NOTE — Progress Notes (Addendum)
Patient ID: Glenn [name], male   DOB: 1951-01-20, 69 y.o.   MRN: [id number]     Advanced Heart Failure Rounding Note  PCP-Cardiologist: Virl Axe, MD   Subjective:    Remains on dobutamine 3. Co-ox 62% -> 71% -> 50%. Feels ok. No orthopnea or PND. Was in AF for a bit yesterday. Got 150mg  IV amio bolus and no back in NSR. Creatinine back up slightly. CVP 8  CT ab suggestive of infected C-tube tract and possible hepatic abscess. Site still draining. Remains AF. Wound cx pending. Started on Zosyn. [facility] contact to re-evaluate.    Objective:   Weight Range: 74.8 kg Body mass index is 25.84 kg/m.   Vital Signs:   Temp:  [97.8 F (36.6 C)-98.2 F (36.8 C)] 98.1 F (36.7 C) (11/26 0804) Pulse Rate:  [47-104] 47 (11/26 0804) Resp:  [18-20] 18 (11/26 0410) BP: (99-139)/(55-72) 124/59 (11/26 0804) SpO2:  [98 %-100 %] 99 % (11/26 0804) Last BM Date: 01/13/20  Weight change: Filed Weights   01/12/20 0500 01/13/20 0354 01/14/20 0333  Weight: 75.3 kg 75.5 kg 74.8 kg    Intake/Output:   Intake/Output Summary (Last 24 hours) at 01/15/2020 0903 Last data filed at 01/15/2020 0300 Gross per 24 hour  Intake 770 ml  Output 575 ml  Net 195 ml      Physical Exam   General:  Lying in bed. No resp difficulty HEENT: normal Neck: supple JVP to jaw Carotids 2+ bilat; no bruits. No lymphadenopathy or thryomegaly appreciated. Cor: PMI nondisplaced. Regular rate & rhythm. +s3 and RV lift Lungs: clear Abdomen: soft, nontender, nondistended. C-tube site dressed.  No hepatosplenomegaly. No bruits or masses. Good bowel sounds. Extremities: no cyanosis, clubbing, rash, edema Neuro: alert & orientedx3, cranial nerves grossly intact. moves all 4 extremities w/o difficulty. Affect pleasant  Telemetry   NSR 70-90s Personally reviewed  Labs    CBC Recent Labs    01/13/20 0841 01/15/20 0430  WBC  --  5.5  HGB 8.5*  9.2* 8.7*  HCT 25.0*  27.0* 28.3*  MCV  --  95.6  PLT  --  366    Basic Metabolic Panel Recent Labs    01/14/20 0500 01/15/20 0430  NA 139 138  K 3.7 4.2  CL 103 100  CO2 27 27  GLUCOSE 123* 120*  BUN 29* 26*  CREATININE 2.08* 2.22*  CALCIUM 8.7* 9.1  MG 2.0  --    Liver Function Tests No results for input(s): AST, ALT, ALKPHOS, BILITOT, PROT, ALBUMIN in the last 72 hours. No results for input(s): LIPASE, AMYLASE in the last 72 hours. Cardiac Enzymes No results for input(s): CKTOTAL, CKMB, CKMBINDEX, TROPONINI in the last 72 hours.  BNP: BNP (last 3 results) Recent Labs    06/01/19 1413 06/23/19 1527 12/25/19 1757  BNP 531.4* 613.2* 776.7*    ProBNP (last 3 results) No results for input(s): PROBNP in the last 8760 hours.   D-Dimer No results for input(s): DDIMER in the last 72 hours. Hemoglobin A1C No results for input(s): HGBA1C in the last 72 hours. Fasting Lipid Panel No results for input(s): CHOL, HDL, LDLCALC, TRIG, CHOLHDL, LDLDIRECT in the last 72 hours. Thyroid Function Tests No results for input(s): TSH, T4TOTAL, T3FREE, THYROIDAB in the last 72 hours.  Invalid input(s): FREET3  Other results:   Imaging    CT ABDOMEN WO CONTRAST  Result Date: 01/14/2020 CLINICAL DATA:  Previous cholecystostomy tube. Now with purulence drainage from cystostomy tube tract. Evaluate for  abscess. EXAM: CT ABDOMEN WITHOUT CONTRAST TECHNIQUE: Multidetector CT imaging of the abdomen was performed following the standard protocol without IV contrast. COMPARISON:  Abdominal sonogram 02/04/19 FINDINGS: Lower chest: Small right pleural effusion identified. The heart size is enlarged. There is an ICD with lead in the right ventricle. Hepatobiliary: Evaluation for abscess is limited due to lack of IV contrast material. The margins of the gallbladder are difficult to identified. Gas and amorphous high attenuation material is identified in what is felt to likely represent the gallbladder, image 25/3. The gallbladder wall appears thickened and  there is a focal area of low attenuation surrounding the gallbladder and extending into segment 4B. This measures 4.5 x 4.4 by 3.9 cm. Low-attenuation fistula tract extends from this structure to the surface of the right upper quadrant ventral abdominal wall, image 16/3. Pancreas: Unremarkable. No pancreatic ductal dilatation or surrounding inflammatory changes. Spleen: Normal in size without focal abnormality. Adrenals/Urinary Tract: Normal appearance of the adrenal glands. No kidney mass or hydronephrosis identified bilaterally. The urinary bladder is unremarkable. Stomach/Bowel: The stomach appears nondistended. No dilated loops of large or small bowel. Vascular/Lymphatic: Aortic atherosclerosis. No aneurysm. IVC filter is identified. No abdominal adenopathy. No enlarged pelvic or inguinal lymph nodes Other: No free fluid identified within the abdomen or pelvis. Musculoskeletal: No acute or significant osseous findings. Degenerative disc disease noted within the lumbar spine. Most advanced at L5-S1. IMPRESSION: 1. Exam detail is diminished due to lack of IV contrast material. 2. Signs of gallbladder inflammation with pericholecystic fluid collection extending into segment 4B of the liver. Findings are concerning for abscess. Additionally, there is a fistula tract which extends to the skin surface along the anterolateral right upper quadrant abdominal wall. 3. Small right pleural effusion. 4. Aortic atherosclerosis. Aortic Atherosclerosis (ICD10-I70.0). These results will be called to the ordering clinician or representative by the Radiologist Assistant, and communication documented in the PACS or [employer]. Electronically Signed   By: Kerby Moors M.D.   On: 01/14/2020 13:17     Medications:     Scheduled Medications: . amiodarone  200 mg Oral BID  . apixaban  5 mg Oral BID  . atorvastatin  40 mg Oral QPM  . Chlorhexidine Gluconate Cloth  6 each Topical Daily  . docusate sodium  100 mg Oral  BID  . feeding supplement  237 mL Oral TID BM  . gabapentin  300 mg Oral BID  . insulin aspart  0-9 Units Subcutaneous TID WC  . melatonin  3 mg Oral QHS  . pantoprazole  40 mg Oral Daily  . sodium chloride flush  10-40 mL Intracatheter Q12H  . sodium chloride flush  3 mL Intravenous Q12H  . torsemide  60 mg Oral Daily    Infusions: . DOBUTamine 3 mcg/kg/min (01/11/20 1029)  . piperacillin-tazobactam (ZOSYN)  IV 3.375 g (01/15/20 4098)    PRN Medications: acetaminophen, ALPRAZolam, calcium carbonate, ondansetron (ZOFRAN) IV, polyethylene glycol, sodium chloride flush    Patient Profile  69 y.o. male with a hx of chronic systolic HF 2/2 NICM s/p BSci ICD, persistent atrial fibrillation, h/o stroke (Lg L MCA infarct w/ hemorrhagic conversion in 2018), HTN, HCV, tobacco abuse (smokes ~1/4 ppd),  and chronic cholecystitis with a chronic perc drain.   Admitted with ADHF. Started on milrinone>> later transitioned to DBA for persistently low co-ox.    Assessment/Plan   1. Acute on chronic systolic biventricular  HF due to NICM -> cardiogenic shock - suspect AF cardiomyopathy - ICD placed  Nov 2015 - [facility] 10/2018 showed normal cors, elevated filling pressures and low CI at 1.9  - TTE 11/2018 LVEF 20%, mild RV dysfunction - Admit 06/2019 with severe HF, required IABP and milrinone. TEE 5/21 EF 20-25% with moderate to severe RV dysfunction.  - Admitted 12/25/2019 with ADHF. Milrinone 0.25 mcg started 11/7 due to low output HF. Echo 12/28/19 EF 20-25% RV mildly decreased. Moved to ICU on 11/8 due to persistently low co-ox on milrinone. 12/29/19: Continued w/ persistently low co-ox despite milrinone 0.5 and NE which was stopped d/t elevated MAPs. Gave metolazone 2/2 low UOP. Transitioned to dobutamine gtt starting at 2.5 mcg then increased to 3 mcg. Required NE but has been off since 11/11.  - Suspect RV function worse than it looks on echo.  - Back on dobutamine 3  CVP 8 co-ox 71% -> 50%. Will  repeat   - Volume status good on torsemide 60 daily.  - Continue with dobutamine 3. Failed wean 2x with dropping co-ox and rising creatinine - No beta blockers due to low output HF - No dig or ACE/ARB/ARNI with worsening renal function.  - Options remain very limited - poor LVAD candidate with noncompliance and renal failure.  - Plan home with dobutamine once wound site stable. PICC in place - Main issue seems to be RV failure. Hard to know what endpoint will be a this time. Hopefully will have some EF improvement with maintaining NSR.    2. Concerning for acute ischemic R MCA -hx of large R MCA with hemorrhagic conversion in Sept 2020 & L MCA s/p thrombectomy M2 occlusion in 2018.  -11/10 around 14:00 developed new L facial droop, LUE weakness and slurred speech - Code Stroke called.  Neuro recs appreciated, did not tolerate change in ICD settings for MRI/MRA; Repeat CT on 11/12 no acute findings, prior remote R MCA infarct. - A1c 4.6%, LDL 63, HDL 26, Cholesterol 103, Triglycerides 68 - Atrovastatin dose increased from home dose.  - No change  3.  Persistent Afib with RVR - In 5/21 had refractory AF requiring multiple DC-CVs, high-dose amio and ranexa.  -He reported intermittent compliance with amio and Eliquis. Out of eliquis prior to admission. Transitioned from eliquis to heparin gtt due to possible need for procedures on 11/9 and planned for TEE DC-CV was scheduled 11/10, however canceled due to new neuro symptoms. - Stopped ranexa on 11/9. - DCCV 11/17 converted to SR. - Back in AF on 11/25 transiently. Converted with IV amio 150 x 1 - Continue amio 200 bid. - Not candidate for AV node ablation CRT currently with possible infection of GB drain tract - Continue with eliquis 5 mg BID.  - No bleeding   4. Acute on CKD IIIb - Baseline Cr looks to be around 1.6 to 1.7 - Admission Cr 1.69 and Peak Cr 2.9 - Today creatinine 2.35 -> 2.08 -> 2.22 - Likely ATN/cardiorenal - Continue  dobutamine at 3 for supprot - Continue to monitor BMET daily   5. Elevated troponin - 10/2018 cath clean coronaries - elevated trops due to afib with RVR, CHF   6. Chronic cholecystitis with perc drain -  Continues with increasing drainage. Remains AF. WBC ok.  - CT 11/25 suggested of tract infection +/- hepatic abscess - Wound cx pending.  - have asked [facility] to re-evaluate - If needs surgery he is probably as optimized as possible currently though still high risk. Would need to bridge with heparin    7. Iron  deficiency anemia - Iron sats low and has received feraheme. - Hgb stable      Glori Bickers, MD  9:03 AM

## 2020-01-15 NOTE — Progress Notes (Signed)
Progress Note  2 Days Post-Op  Subjective: Asked by primary team to re-evaluate prior cholecystostomy site for purulent drainage.  Patient reports site has been draining and they are having to change dressing QID. He denies abdominal pain, nausea, vomiting, fever, chills. He is tolerating diet and having bowel function. Per RN, he may be going home early next week on dobutamine gtt.   Objective: Vital signs in last 24 hours: Temp:  [97.8 F (36.6 C)-98.2 F (36.8 C)] 98.1 F (36.7 C) (11/26 0804) Pulse Rate:  [47-104] 47 (11/26 0804) Resp:  [18-20] 18 (11/26 0410) BP: (99-139)/(55-72) 124/59 (11/26 0804) SpO2:  [98 %-100 %] 99 % (11/26 0804) Last BM Date: 01/13/20  Intake/Output from previous day: 11/25 0701 - 11/26 0700 In: 770 [P.O.:720; IV Piggyback:50] Out: 575 [Urine:575] Intake/Output this shift: No intake/output data recorded.  PE: General: pleasant, WD, chronically ill appearing male who is laying in bed in NAD HEENT: Sclera are anicteric. Ears and nose without any masses or lesions.  Mouth is pink and moist Heart: regular, rate, and rhythm.  Lungs: CTAB, no wheezes, rhonchi, or rales noted.  Respiratory effort nonlabored Abd: soft, NT, ND, +BS, Prior cholecystostomy site with hypergranulation tissue and moderate white drainage, no surrounding erythema or induration  MS: all 4 extremities are symmetrical with no cyanosis, clubbing, or edema. Skin: warm and dry with no masses, lesions, or rashes Neuro: Cranial nerves 2-12 grossly intact, sensation is normal throughout Psych: A&Ox3 with an appropriate affect.    Lab Results:  Recent Labs    01/13/20 0841 01/15/20 0430  WBC  --  5.5  HGB 8.5*  9.2* 8.7*  HCT 25.0*  27.0* 28.3*  PLT  --  186   BMET Recent Labs    01/14/20 0500 01/15/20 0430  NA 139 138  K 3.7 4.2  CL 103 100  CO2 27 27  GLUCOSE 123* 120*  BUN 29* 26*  CREATININE 2.08* 2.22*  CALCIUM 8.7* 9.1   PT/INR No results for input(s):  LABPROT, INR in the last 72 hours. CMP     Component Value Date/Time   NA 138 01/15/2020 0430   NA 140 09/01/2019 1056   K 4.2 01/15/2020 0430   CL 100 01/15/2020 0430   CO2 27 01/15/2020 0430   GLUCOSE 120 (H) 01/15/2020 0430   BUN 26 (H) 01/15/2020 0430   BUN 18 09/01/2019 1056   CREATININE 2.22 (H) 01/15/2020 0430   CREATININE 1.40 (H) 12/03/2017 1407   CALCIUM 9.1 01/15/2020 0430   PROT 6.5 12/31/2019 1540   PROT 7.1 08/26/2019 1334   ALBUMIN 3.1 (L) 12/31/2019 1540   ALBUMIN 4.3 09/01/2019 1056   AST 42 (H) 12/31/2019 1540   ALT 132 (H) 12/31/2019 1540   ALT 45 05/21/2017 1520   ALKPHOS 88 12/31/2019 1540   BILITOT 1.3 (H) 12/31/2019 1540   BILITOT 0.3 08/26/2019 1334   GFRNONAA 32 (L) 01/15/2020 0430   GFRNONAA 69 05/21/2017 1510   GFRAA 51 (L) 11/10/2019 1210   GFRAA 80 05/21/2017 1510   Lipase     Component Value Date/Time   LIPASE 31 12/01/2019 0900       Studies/Results: CT ABDOMEN WO CONTRAST  Result Date: 01/14/2020 CLINICAL DATA:  Previous cholecystostomy tube. Now with purulence drainage from cystostomy tube tract. Evaluate for abscess. EXAM: CT ABDOMEN WITHOUT CONTRAST TECHNIQUE: Multidetector CT imaging of the abdomen was performed following the standard protocol without IV contrast. COMPARISON:  Abdominal sonogram 02/04/19 FINDINGS: Lower chest: Small  right pleural effusion identified. The heart size is enlarged. There is an ICD with lead in the right ventricle. Hepatobiliary: Evaluation for abscess is limited due to lack of IV contrast material. The margins of the gallbladder are difficult to identified. Gas and amorphous high attenuation material is identified in what is felt to likely represent the gallbladder, image 25/3. The gallbladder wall appears thickened and there is a focal area of low attenuation surrounding the gallbladder and extending into segment 4B. This measures 4.5 x 4.4 by 3.9 cm. Low-attenuation fistula tract extends from this structure  to the surface of the right upper quadrant ventral abdominal wall, image 16/3. Pancreas: Unremarkable. No pancreatic ductal dilatation or surrounding inflammatory changes. Spleen: Normal in size without focal abnormality. Adrenals/Urinary Tract: Normal appearance of the adrenal glands. No kidney mass or hydronephrosis identified bilaterally. The urinary bladder is unremarkable. Stomach/Bowel: The stomach appears nondistended. No dilated loops of large or small bowel. Vascular/Lymphatic: Aortic atherosclerosis. No aneurysm. IVC filter is identified. No abdominal adenopathy. No enlarged pelvic or inguinal lymph nodes Other: No free fluid identified within the abdomen or pelvis. Musculoskeletal: No acute or significant osseous findings. Degenerative disc disease noted within the lumbar spine. Most advanced at L5-S1. IMPRESSION: 1. Exam detail is diminished due to lack of IV contrast material. 2. Signs of gallbladder inflammation with pericholecystic fluid collection extending into segment 4B of the liver. Findings are concerning for abscess. Additionally, there is a fistula tract which extends to the skin surface along the anterolateral right upper quadrant abdominal wall. 3. Small right pleural effusion. 4. Aortic atherosclerosis. Aortic Atherosclerosis (ICD10-I70.0). These results will be called to the ordering clinician or representative by the Radiologist Assistant, and communication documented in the PACS or [employer]. Electronically Signed   By: Kerby Moors M.D.   On: 01/14/2020 13:17    Anti-infectives: Anti-infectives (From admission, onward)   Start     Dose/Rate Route Frequency Ordered Stop   01/14/20 2230  piperacillin-tazobactam (ZOSYN) IVPB 3.375 g        3.375 g 12.5 mL/hr over 240 Minutes Intravenous Every 8 hours 01/14/20 2141         Assessment/Plan HTN HLD Acute on Chronic HF 2/2 NICM w/ AICD (EF 20-25% 11/8) - per Cards Hep C Hx of CVA Hx of DVT s/p IVC filter  A. Fib on  Eliquis Chronic Kidney Disease - Cr 2.22 - Per Primary Team -   Hx of Acute Cholecystitis 12/12/2018 s/p Perc Chole Drain by IR 12/13/2018 Drainage from prior Perc Chole drain site  - This is a 69 year old male with multiple medical problems as listed above who we are asked to see for problems with patients Perc Chole drain. Drain was previously placed by IR on 12/13/2018 after patient was hospitalized with acute cholecystitis. During patients last drain exchange on 09/23/2019 the gallbladder was significantly contracted and scarred, and a 10 French drain would not form within the gallbladder.  Patient was to follow-up with Dr. Zenia Resides in the office but missed his last appointments. No imaging of the gallbladder obtained during admission prior to our evaluation. No recent LFTs during admission.  WBC 4.7. No TTP on exam and he is tolerating a diet without abdomianl pain, n/v. Clinically patient does not have Acute Cholecystitis. Patient is not a good surgical candidate. On exam there is an area of hypergranulation tissue from prior drain site. Patient may have underlying fistula from prior perc chole drain site but currently is without any abdominal pain/tenderness, normal wbc  and afebrile. Would recommend continuing silver nitrate to hypergranulated tissue to try to get tract to close. Drainage almost appears more like colonization of hypergranulation tissue from skin flora. Patient does not need further abx with no leukocytosis and afebrile. We will follow while admitted and set up to be seen again in the office next week if discharged.   FEN - HH diet  VTE - SCDs, eliquis ID - Zosyn 11/25>11/26  LOS: 20 days    Norm Parcel , [facility] Surgery 01/15/2020, 10:30 AM Please see Amion for pager number during day hours 7:00am-4:30pm

## 2020-01-15 NOTE — TOC Initial Note (Signed)
Transition of Care [facility]) - Initial/Assessment Note    Patient Details  Name: Glenn [name] MRN: [id number] Date of Birth: 02-24-1950  Transition of Care [facility]) CM/SW Contact:    Sherrilyn Rist Transition of Care Supervisor Phone Number: [phone] 01/15/2020, 3:55 PM  Clinical Narrative:                 Carolynn Sayers RN with [employer] called. She is following the patient for home Dobutamine infusion and [facility] is following for Therapy needs.  Primary Care Provider: Gerlene Fee, DO; has private insurance with [facility] with prescription drug coverage; Pam stated that possibly plans for dc early next week. TOC will continue to follow for progression of care.  Expected Discharge Plan: [place] Barriers to Discharge: No Barriers Identified   Patient Goals and CMS Choice Patient states their goals for this hospitalization and ongoing recovery are:: to go home      Expected Discharge Plan and Services Expected Discharge Plan: [place]         Expected Discharge Date: 09/23/19                         HH Arranged: RN, PT, OT [place] Agency: [place] ([place]) Date [place]: 01/15/20 Time [place]: [place] Representative spoke with at [place]: Carolynn Sayers RN  Prior Living Arrangements/Services                       Activities of Daily Living Home Assistive Devices/Equipment: None ADL Screening (condition at time of admission) Patient's cognitive ability adequate to safely complete daily activities?: Yes Is the patient deaf or have difficulty hearing?: No Does the patient have difficulty seeing, even when wearing glasses/contacts?: No Does the patient have difficulty concentrating, remembering, or making decisions?: No Patient able to express need for assistance with ADLs?: Yes Does the patient have difficulty dressing or bathing?: No Independently  performs ADLs?: Yes (appropriate for developmental age) Does the patient have difficulty walking or climbing stairs?: No Weakness of Legs: Left Weakness of Arms/Hands: Left  Permission Sought/Granted                  Emotional Assessment              Admission diagnosis:  Atrial fibrillation with RVR ([place]) [I48.91] Acute on chronic congestive heart failure, unspecified heart failure type [facility]) [I50.9] Patient Active Problem List   Diagnosis Date Noted  . Anxiety 09/02/2019  . Malnutrition of moderate degree 07/14/2019  . Palliative care by specialist   . Atrial fibrillation with RVR ([place]) 06/23/2019  . Chronic systolic heart failure ([place]) 06/21/2019  . Persistent atrial fibrillation with rapid ventricular response ([place])   . Thrombus of left atrial appendage   . Left leg pain 03/27/2019  . History of hemorrhagic stroke with residual hemiparesis ([place]) 02/04/2019  . Left leg DVT ([place]) 02/04/2019  . Chronic cholecystitis 02/04/2019  . Acute deep vein thrombosis (DVT) of both lower extremities ([name])   . Acute blood loss anemia   . Labile blood glucose   . CKD (chronic kidney disease) stage 3, GFR 30-59 ml/min ([name])   . Chronic right hip pain   . Headache due to intracranial disease 11/14/2018  . Trochanteric bursitis, right hip 11/14/2018  . Cerebral edema ([place]) 11/13/2018  . Hyperlipidemia LDL goal <70 11/13/2018  . Marijuana user  11/13/2018  . Right middle cerebral artery stroke ([place]) 11/13/2018  . Acute CVA (cerebrovascular accident) ([place])   . Noncompliance   . Dysphagia, post-stroke   . Acute on chronic combined systolic and diastolic CHF (congestive heart failure) ([place])   . DCM (dilated cardiomyopathy) ([place])   . Atrial fibrillation ([place]) 11/04/2018  . Elevated troponin   . Acute respiratory failure with hypoxia ([place])   . Entrapment of right ulnar nerve 02/28/2018  . Carpal tunnel syndrome of right wrist 02/28/2018  . Impotence due to erectile dysfunction  09/30/2017  . Solitary pulmonary nodule 06/10/2017  . Neck pain 04/06/2017  . Paroxysmal atrial fibrillation ([name])   . DNR (do not resuscitate) discussion 03/25/2017  . ICD (implantable cardioverter-defibrillator) in place 09/13/2016  . Chest pain 09/13/2016  . Tobacco abuse 09/13/2016  . Upper back pain 09/13/2016  . Housing problems 09/13/2016  . Ischemic cardiomyopathy   . Cerebral infarction ([place])   . Stroke (cerebrum) ([name]) Lg L MCA infarct w/ hemorrhagic conversion, embolic d/t AF 88/28/0034  . HTN (hypertension) 08/14/2016  . Chronic Hepatitis C  08/14/2016   PCP:  Gerlene Fee, DO Pharmacy:   [facility] [place], [place] - [address] AT [place] [address] Phone: [phone] Fax: [phone]  [name] [place], [place] - [address] [place] [address] Phone: [phone] Fax: [phone]     Social Determinants of Health (SDOH) Interventions    Readmission Risk Interventions Readmission Risk Prevention Plan 07/15/2019 02/05/2019  Transportation Screening Complete Complete  Medication Review (RN Care Manager) - Complete  PCP or Specialist appointment within 3-5 days of discharge - Complete  HRI or [place] - Complete  SW Recovery Care/Counseling Consult Complete Complete  Palliative Care Screening Complete Not [place] Complete Not Applicable  Some recent data might be hidden

## 2020-01-15 NOTE — Plan of Care (Signed)

## 2020-01-15 NOTE — Progress Notes (Signed)
Physical Therapy Treatment Patient Details Name: NIRANJAN RUFENER MRN: [id number] DOB: [birth year] Today's Date: 01/15/2020    History of Present Illness TRAVIN MARIK is a 69 y.o. male with a hx of chronic systolic HF 2/2 NICM s/p BSci ICD, atrial fibrillation, h/o stroke (L MCA 2018 and R MCA 2020), h/o DVT, chornic a/c therapy, HTN, HLD, pulmonary nodule, Hepatitis C, carpal tunnel,  tobacco abuse (smokes ~1/4 ppd) and chronic cholecystitis with a chronic perc drain.  Pt presented with dizziness and shortness of breath and was admitted to hospital with Afib RVR and CHF.  On 12/30/19 pt with new L facial droop and L weakness - CT was negative for acute changes and pt unable to have MRI.    PT Comments    Pt making good progress.  Does demonstrate L LE weakness with gait as he fatigues requiring 2 standing rest breaks.  Updated goals today.   Follow Up Recommendations  Home health PT;Supervision for mobility/OOB     Equipment Recommendations  Rolling walker with 5" wheels    Recommendations for Other Services       Precautions / Restrictions Precautions Precautions: Fall Precaution Comments: watch HR    Mobility  Bed Mobility Overal bed mobility: Modified Independent Bed Mobility: Supine to Sit;Sit to Supine     Supine to sit: Modified independent (Device/Increase time);HOB elevated Sit to supine: Modified independent (Device/Increase time);HOB elevated   General bed mobility comments: no assist needed, help to manage lines.  Transfers Overall transfer level: Needs assistance Equipment used: None Transfers: Sit to/from Stand Sit to Stand: Supervision         General transfer comment: safety for lines and leads  Ambulation/Gait Ambulation/Gait assistance: Supervision Gait Distance (Feet): 400 Feet Assistive device: Rolling walker (2 wheeled) Gait Pattern/deviations: Decreased weight shift to left;Decreased step length - right;Decreased dorsiflexion -  left;Drifts right/left Gait velocity: decreased   General Gait Details: Required 2 standing rest breaks, reports L leg feels weak but that is close to his baseline; as he fatigued pt with decreased DF on left   Stairs             Wheelchair Mobility    Modified Rankin (Stroke Patients Only)       Balance Overall balance assessment: Needs assistance Sitting-balance support: Feet supported;No upper extremity supported Sitting balance-Leahy Scale: Good     Standing balance support: Bilateral upper extremity supported;No upper extremity supported Standing balance-Leahy Scale: Fair Standing balance comment: Able to static stand without UE support; RW for ambulation                            Cognition Arousal/Alertness: Awake/alert Behavior During Therapy: WFL for tasks assessed/performed Overall Cognitive Status: Within Functional Limits for tasks assessed                                        Exercises      General Comments General comments (skin integrity, edema, etc.): Max HR noted 98 bpm      Pertinent Vitals/Pain Pain Assessment: No/denies pain    Home Living                      Prior Function            PT Goals (current goals can now be found in the care plan  section) Acute Rehab PT Goals Patient Stated Goal: return home PT Goal Formulation: With patient Time For Goal Achievement: 01/29/20 Potential to Achieve Goals: Good Additional Goals Additional Goal #1: Pt will score > 19 on DGI to indicate low fall risk Progress towards PT goals: Progressing toward goals    Frequency    Min 3X/week      PT Plan Current plan remains appropriate    Co-evaluation              AM-PAC PT "6 Clicks" Mobility   Outcome Measure  Help needed turning from your back to your side while in a flat bed without using bedrails?: None Help needed moving from lying on your back to sitting on the side of a flat bed  without using bedrails?: None Help needed moving to and from a bed to a chair (including a wheelchair)?: None Help needed standing up from a chair using your arms (e.g., wheelchair or bedside chair)?: None Help needed to walk in hospital room?: None Help needed climbing 3-5 steps with a railing? : None 6 Click Score: 24    End of Session Equipment Utilized During Treatment: Gait belt Activity Tolerance: Patient tolerated treatment well Patient left: in bed;with call bell/phone within reach Nurse Communication: Mobility status PT Visit Diagnosis: Difficulty in walking, not elsewhere classified (R26.2)     Time: 1415-1430 PT Time Calculation (min) (ACUTE ONLY): 15 min  Charges:  $Gait Training: 8-22 mins                     Abran Richard, PT Acute Rehab Services Pager [phone] [name] 01/15/2020, 2:38 PM

## 2020-01-16 DIAGNOSIS — I4819 Other persistent atrial fibrillation: Secondary | ICD-10-CM | POA: Diagnosis not present

## 2020-01-16 DIAGNOSIS — I5043 Acute on chronic combined systolic (congestive) and diastolic (congestive) heart failure: Secondary | ICD-10-CM | POA: Diagnosis not present

## 2020-01-16 LAB — BASIC METABOLIC PANEL
Anion gap: 11 (ref 5–15)
BUN: 28 mg/dL — ABNORMAL HIGH (ref 8–23)
CO2: 27 mmol/L (ref 22–32)
Calcium: 9.2 mg/dL (ref 8.9–10.3)
Chloride: 101 mmol/L (ref 98–111)
Creatinine, Ser: 2.5 mg/dL — ABNORMAL HIGH (ref 0.61–1.24)
GFR, Estimated: 27 mL/min — ABNORMAL LOW (ref >60)
Glucose, Bld: 99 mg/dL (ref 70–99)
Potassium: 4 mmol/L (ref 3.5–5.1)
Sodium: 139 mmol/L (ref 135–145)

## 2020-01-16 LAB — COOXEMETRY PANEL
Carboxyhemoglobin: 2.3 % — ABNORMAL HIGH (ref 0.5–1.5)
Methemoglobin: 0.8 % (ref 0.0–1.5)
O2 Saturation: 61.7 %
Total hemoglobin: 9 g/dL — ABNORMAL LOW (ref 12.0–16.0)

## 2020-01-16 LAB — GLUCOSE, CAPILLARY
Glucose-Capillary: 85 mg/dL (ref 70–99)
Glucose-Capillary: 105 mg/dL — ABNORMAL HIGH (ref 70–99)
Glucose-Capillary: 109 mg/dL — ABNORMAL HIGH (ref 70–99)
Glucose-Capillary: 111 mg/dL — ABNORMAL HIGH (ref 70–99)

## 2020-01-16 NOTE — Progress Notes (Signed)
Patient ID: Glenn [name], male   DOB: Apr 26, 1950, 69 y.o.   MRN: [id number]     Advanced Heart Failure Rounding Note  PCP-Cardiologist: Virl Axe, MD   Subjective:    Remains on dobutamine 3.  Co-ox 62% today.  Creatinine higher at 2.5, CVP 11-12.  No dyspnea.   He is in NSR on amiodarone.   CT abdomen suggestive of infected cholecystostomy-tube tract and possible hepatic abscess. He remains afebrile, no CBC today.  Antibiotics were stopped.  Surgery saw yesterday and cauterized wound, did not think active infection present.  Wound culture now with few Klebsiella and few S aureus.     Objective:   Weight Range: 75.1 kg Body mass index is 25.93 kg/m.   Vital Signs:   Temp:  [98 F (36.7 C)-98.5 F (36.9 C)] 98 F (36.7 C) (11/27 0803) Pulse Rate:  [73-84] 73 (11/27 0803) Resp:  [17-20] 18 (11/27 0803) BP: (106-134)/(46-79) 112/60 (11/27 0803) SpO2:  [98 %-100 %] 100 % (11/27 0803) Weight:  [75.1 kg] 75.1 kg (11/27 0546) Last BM Date: 01/14/20  Weight change: Filed Weights   01/13/20 0354 01/14/20 0333 01/16/20 0546  Weight: 75.5 kg 74.8 kg 75.1 kg    Intake/Output:   Intake/Output Summary (Last 24 hours) at 01/16/2020 1012 Last data filed at 01/16/2020 0547 Gross per 24 hour  Intake 1177.46 ml  Output 1950 ml  Net -772.54 ml      Physical Exam   General: NAD Neck: JVP 10 cm, no thyromegaly or thyroid nodule.  Lungs: Clear to auscultation bilaterally with normal respiratory effort. CV: Lateral PMI.  Heart regular S1/S2, no S3/S4, no murmur.  No peripheral edema.  Abdomen: Soft, nontender, no hepatosplenomegaly, no distention.  Skin: Intact without lesions or rashes.  Neurologic: Alert and oriented x 3.  Psych: Normal affect. Extremities: No clubbing or cyanosis.  HEENT: Normal.    Telemetry   NSR 80s Personally reviewed  Labs    CBC Recent Labs    01/15/20 0430  WBC 5.5  HGB 8.7*  HCT 28.3*  MCV 95.6  PLT 737   Basic Metabolic  Panel Recent Labs    01/14/20 0500 01/14/20 0500 01/15/20 0430 01/16/20 0453  NA 139   < > 138 139  K 3.7   < > 4.2 4.0  CL 103   < > 100 101  CO2 27   < > 27 27  GLUCOSE 123*   < > 120* 99  BUN 29*   < > 26* 28*  CREATININE 2.08*   < > 2.22* 2.50*  CALCIUM 8.7*   < > 9.1 9.2  MG 2.0  --   --   --    < > = values in this interval not displayed.   Liver Function Tests No results for input(s): AST, ALT, ALKPHOS, BILITOT, PROT, ALBUMIN in the last 72 hours. No results for input(s): LIPASE, AMYLASE in the last 72 hours. Cardiac Enzymes No results for input(s): CKTOTAL, CKMB, CKMBINDEX, TROPONINI in the last 72 hours.  BNP: BNP (last 3 results) Recent Labs    06/01/19 1413 06/23/19 1527 12/25/19 1757  BNP 531.4* 613.2* 776.7*    ProBNP (last 3 results) No results for input(s): PROBNP in the last 8760 hours.   D-Dimer No results for input(s): DDIMER in the last 72 hours. Hemoglobin A1C No results for input(s): HGBA1C in the last 72 hours. Fasting Lipid Panel No results for input(s): CHOL, HDL, LDLCALC, TRIG, CHOLHDL, LDLDIRECT in the last  72 hours. Thyroid Function Tests No results for input(s): TSH, T4TOTAL, T3FREE, THYROIDAB in the last 72 hours.  Invalid input(s): FREET3  Other results:   Imaging    No results found.   Medications:     Scheduled Medications: . amiodarone  200 mg Oral BID  . apixaban  5 mg Oral BID  . atorvastatin  40 mg Oral QPM  . Chlorhexidine Gluconate Cloth  6 each Topical Daily  . docusate sodium  100 mg Oral BID  . feeding supplement  237 mL Oral TID BM  . gabapentin  300 mg Oral BID  . insulin aspart  0-9 Units Subcutaneous TID WC  . melatonin  3 mg Oral QHS  . pantoprazole  40 mg Oral Daily  . sodium chloride flush  10-40 mL Intracatheter Q12H  . sodium chloride flush  3 mL Intravenous Q12H  . torsemide  60 mg Oral Daily    Infusions: . DOBUTamine 3 mcg/kg/min (01/16/20 0223)    PRN Medications: acetaminophen,  ALPRAZolam, calcium carbonate, ondansetron (ZOFRAN) IV, polyethylene glycol, sodium chloride flush    Patient Profile  69 y.o. male with a hx of chronic systolic HF 2/2 NICM s/p BSci ICD, persistent atrial fibrillation, h/o stroke (Lg L MCA infarct w/ hemorrhagic conversion in 2018), HTN, HCV, tobacco abuse (smokes ~1/4 ppd),  and chronic cholecystitis with a chronic perc drain.   Admitted with ADHF. Started on milrinone>> later transitioned to DBA for persistently low co-ox.    Assessment/Plan   1. Acute on chronic systolic biventricular  HF due to NICM -> cardiogenic shock - suspect AF cardiomyopathy - ICD placed Nov 2015 - [facility] 10/2018 showed normal cors, elevated filling pressures and low CI at 1.9  - TTE 11/2018 LVEF 20%, mild RV dysfunction - Admit 06/2019 with severe HF, required IABP and milrinone. TEE 5/21 EF 20-25% with moderate to severe RV dysfunction.  - Admitted 12/25/2019 with ADHF. Milrinone 0.25 mcg started 11/7 due to low output HF. Echo 12/28/19 EF 20-25% RV mildly decreased. Moved to ICU on 11/8 due to persistently low co-ox on milrinone. 12/29/19: Continued w/ persistently low co-ox despite milrinone 0.5 and NE which was stopped d/t elevated MAPs. Gave metolazone 2/2 low UOP. Transitioned to dobutamine gtt starting at 2.5 mcg then increased to 3 mcg. Required NE but has been off since 11/11.  - Suspect RV function worse than it looks on echo.  - Back on dobutamine 3 with co-ox 62%, continue (failed wean).   - Now on torsemide 60 mg daily.  CVP 11-12 but creatinine up to 2.5.  Will not change diuretic today.   - No beta blockers due to low output HF - No dig or ACE/ARB/ARNI with worsening renal function.  - Options remain very limited - poor LVAD candidate with noncompliance and renal failure.  - Plan home with dobutamine once wound site stable. PICC in place.  - Main issue seems to be RV failure. Hard to know what endpoint will be a this time. Hopefully will have some EF  improvement with maintaining NSR.    2. Concerning for acute ischemic R MCA -hx of large R MCA with hemorrhagic conversion in Sept 2020 & L MCA s/p thrombectomy M2 occlusion in 2018.  -11/10 around 14:00 developed new L facial droop, LUE weakness and slurred speech - Code Stroke called.  Neuro recs appreciated, did not tolerate change in ICD settings for MRI/MRA; Repeat CT on 11/12 no acute findings, prior remote R MCA infarct. - A1c  4.6%, LDL 63, HDL 26, Cholesterol 103, Triglycerides 68 - Atrovastatin dose increased from home dose.  - No change  3.  Persistent Afib with RVR - In 5/21 had refractory AF requiring multiple DC-CVs, high-dose amio and ranexa.  -He reported intermittent compliance with amio and Eliquis. Out of eliquis prior to admission. Transitioned from eliquis to heparin gtt due to possible need for procedures on 11/9 and planned for TEE DC-CV was scheduled 11/10, however canceled due to new neuro symptoms. - Stopped ranexa on 11/9. - DCCV 11/17 converted to SR. - Back in AF on 11/25 transiently. Converted with IV amio 150 x 1 - Continue amio 200 bid, in NSR today.  - Not candidate for AV node ablation CRT currently with possible infection of GB drain tract - Continue with eliquis 5 mg BID.  - No bleeding   4. Acute on CKD IIIb - Baseline Cr looks to be around 1.6 to 1.7 - Admission Cr 1.69 and Peak Cr 2.9 - Creatinine 2.35 -> 2.08 -> 2.22 -> 2.5 - Likely ATN/cardiorenal - Continue dobutamine at 3 for support - Continue to monitor BMET daily   5. Elevated troponin - 10/2018 cath clean coronaries - elevated trops due to afib with RVR, CHF   6. Chronic cholecystitis with perc drain - Continues with increasing drainage at former drain site. Remains AF. WBC ok previously, no CBC today.  - CT 11/25 suggested of tract infection +/- hepatic abscess - Wound cx with few Klebsiella and S aureus  - General surgery has seen, cauterized site, did not think active infection and  off abx.  They will continue to follow per note.    7. Iron deficiency anemia - Iron sats low and has received feraheme. - Hgb stable      Loralie Champagne, MD  10:12 AM

## 2020-01-16 NOTE — Plan of Care (Signed)
  Problem: Education: Goal: Knowledge of disease or condition will improve Outcome: Progressing Goal: Understanding of medication regimen will improve Outcome: Progressing Goal: Individualized Educational Video(s) Outcome: Progressing   Problem: Activity: Goal: Ability to tolerate increased activity will improve Outcome: Progressing   Problem: Cardiac: Goal: Ability to achieve and maintain adequate cardiopulmonary perfusion will improve Outcome: Progressing   Problem: Health Behavior/Discharge Planning: Goal: Ability to safely manage health-related needs after discharge will improve Outcome: Progressing   Problem: Education: Goal: Ability to demonstrate management of disease process will improve Outcome: Progressing Goal: Ability to verbalize understanding of medication therapies will improve Outcome: Progressing Goal: Individualized Educational Video(s) Outcome: Progressing   Problem: Activity: Goal: Capacity to carry out activities will improve Outcome: Progressing   Problem: Cardiac: Goal: Ability to achieve and maintain adequate cardiopulmonary perfusion will improve Outcome: Progressing   Problem: Education: Goal: Knowledge of General Education information will improve Description: Including pain rating scale, medication(s)/side effects and non-pharmacologic comfort measures Outcome: Progressing   Problem: Health Behavior/Discharge Planning: Goal: Ability to manage health-related needs will improve Outcome: Progressing   Problem: Clinical Measurements: Goal: Ability to maintain clinical measurements within normal limits will improve Outcome: Progressing Goal: Will remain free from infection Outcome: Progressing Goal: Diagnostic test results will improve Outcome: Progressing Goal: Respiratory complications will improve Outcome: Progressing Goal: Cardiovascular complication will be avoided Outcome: Progressing   Problem: Activity: Goal: Risk for activity  intolerance will decrease Outcome: Progressing   Problem: Nutrition: Goal: Adequate nutrition will be maintained Outcome: Progressing   Problem: Coping: Goal: Level of anxiety will decrease Outcome: Progressing   Problem: Elimination: Goal: Will not experience complications related to bowel motility Outcome: Progressing Goal: Will not experience complications related to urinary retention Outcome: Progressing   Problem: Pain Managment: Goal: General experience of comfort will improve Outcome: Progressing   Problem: Safety: Goal: Ability to remain free from injury will improve Outcome: Progressing   Problem: Skin Integrity: Goal: Risk for impaired skin integrity will decrease Outcome: Progressing   

## 2020-01-17 DIAGNOSIS — I4891 Unspecified atrial fibrillation: Secondary | ICD-10-CM | POA: Diagnosis not present

## 2020-01-17 DIAGNOSIS — I5043 Acute on chronic combined systolic (congestive) and diastolic (congestive) heart failure: Secondary | ICD-10-CM | POA: Diagnosis not present

## 2020-01-17 LAB — CBC
HCT: 27.3 % — ABNORMAL LOW (ref 39.0–52.0)
Hemoglobin: 8.6 g/dL — ABNORMAL LOW (ref 13.0–17.0)
MCH: 29.9 pg (ref 26.0–34.0)
MCHC: 31.5 g/dL (ref 30.0–36.0)
MCV: 94.8 fL (ref 80.0–100.0)
Platelets: 182 10*3/uL (ref 150–400)
RBC: 2.88 MIL/uL — ABNORMAL LOW (ref 4.22–5.81)
RDW: 17.6 % — ABNORMAL HIGH (ref 11.5–15.5)
WBC: 4.5 10*3/uL (ref 4.0–10.5)
nRBC: 0 % (ref 0.0–0.2)

## 2020-01-17 LAB — GLUCOSE, CAPILLARY
Glucose-Capillary: 101 mg/dL — ABNORMAL HIGH (ref 70–99)
Glucose-Capillary: 121 mg/dL — ABNORMAL HIGH (ref 70–99)
Glucose-Capillary: 92 mg/dL (ref 70–99)
Glucose-Capillary: 112 mg/dL — ABNORMAL HIGH (ref 70–99)

## 2020-01-17 LAB — AEROBIC CULTURE W GRAM STAIN (SUPERFICIAL SPECIMEN)

## 2020-01-17 LAB — BASIC METABOLIC PANEL
Anion gap: 11 (ref 5–15)
BUN: 25 mg/dL — ABNORMAL HIGH (ref 8–23)
CO2: 28 mmol/L (ref 22–32)
Calcium: 8.9 mg/dL (ref 8.9–10.3)
Chloride: 100 mmol/L (ref 98–111)
Creatinine, Ser: 2.31 mg/dL — ABNORMAL HIGH (ref 0.61–1.24)
GFR, Estimated: 30 mL/min — ABNORMAL LOW (ref >60)
Glucose, Bld: 125 mg/dL — ABNORMAL HIGH (ref 70–99)
Potassium: 3.9 mmol/L (ref 3.5–5.1)
Sodium: 139 mmol/L (ref 135–145)

## 2020-01-17 LAB — COOXEMETRY PANEL
Carboxyhemoglobin: 2 % — ABNORMAL HIGH (ref 0.5–1.5)
Methemoglobin: 0.5 % (ref 0.0–1.5)
O2 Saturation: 64 %
Total hemoglobin: 8.8 g/dL — ABNORMAL LOW (ref 12.0–16.0)

## 2020-01-17 MED ORDER — CEFAZOLIN SODIUM-DEXTROSE 2-4 GM/100ML-% IV SOLN
2.0000 g | Freq: Two times a day (BID) | INTRAVENOUS | Status: DC
  Administered 2020-01-17 (×2): 2 g via INTRAVENOUS
  Filled 2020-01-17 (×2): qty 100

## 2020-01-17 MED ORDER — POTASSIUM CHLORIDE CRYS ER 20 MEQ PO TBCR
20.0000 meq | EXTENDED_RELEASE_TABLET | Freq: Once | ORAL | Status: AC
  Administered 2020-01-17: 20 meq via ORAL
  Filled 2020-01-17: qty 1

## 2020-01-17 MED ORDER — FUROSEMIDE 10 MG/ML IJ SOLN
80.0000 mg | Freq: Once | INTRAMUSCULAR | Status: AC
  Administered 2020-01-17: 80 mg via INTRAVENOUS
  Filled 2020-01-17: qty 8

## 2020-01-17 NOTE — Progress Notes (Signed)
Patient ID: Glenn [name], male   DOB: 1950-06-18, 69 y.o.   MRN: [id number]     Advanced Heart Failure Rounding Note  PCP-Cardiologist: Virl Axe, MD   Subjective:    Remains on dobutamine 3.  Co-ox 64% today.  Creatinine lower at 2.31, CVP up to 15.  No dyspnea.   He is in NSR on amiodarone.   CT abdomen suggestive of infected cholecystostomy-tube tract and possible hepatic abscess. He remains afebrile, no CBC today.  Antibiotics were stopped.  Surgery has seen and cauterized wound, did not think active infection present.  Wound culture now with few Klebsiella and few S aureus.  Tract still draining.    Objective:   Weight Range: 75.2 kg Body mass index is 25.97 kg/m.   Vital Signs:   Temp:  [97.4 F (36.3 C)-98.5 F (36.9 C)] 97.8 F (36.6 C) (11/28 1109) Pulse Rate:  [73-80] 80 (11/28 1109) Resp:  [16-23] 19 (11/28 1109) BP: (93-127)/(63-85) 93/73 (11/28 1109) SpO2:  [95 %-97 %] 97 % (11/28 1109) Weight:  [75.2 kg] 75.2 kg (11/28 0324) Last BM Date: 01/16/20  Weight change: Filed Weights   01/14/20 0333 01/16/20 0546 01/17/20 0324  Weight: 74.8 kg 75.1 kg 75.2 kg    Intake/Output:   Intake/Output Summary (Last 24 hours) at 01/17/2020 1119 Last data filed at 01/17/2020 0830 Gross per 24 hour  Intake 556.58 ml  Output 1260 ml  Net -703.42 ml      Physical Exam   General: NAD Neck: JVP 12-14 cm, no thyromegaly or thyroid nodule.  Lungs: Clear to auscultation bilaterally with normal respiratory effort. CV: Nondisplaced PMI.  Heart regular S1/S2, no S3/S4, no murmur.  No peripheral edema.   Abdomen: Soft, nontender, no hepatosplenomegaly, no distention.  Skin: Intact without lesions or rashes.  Neurologic: Alert and oriented x 3.  Psych: Normal affect. Extremities: No clubbing or cyanosis.  HEENT: Normal.    Telemetry   NSR 80s Personally reviewed  Labs    CBC Recent Labs    01/15/20 0430 01/17/20 0342  WBC 5.5 4.5  HGB 8.7* 8.6*   HCT 28.3* 27.3*  MCV 95.6 94.8  PLT 186 539   Basic Metabolic Panel Recent Labs    01/16/20 0453 01/17/20 0342  NA 139 139  K 4.0 3.9  CL 101 100  CO2 27 28  GLUCOSE 99 125*  BUN 28* 25*  CREATININE 2.50* 2.31*  CALCIUM 9.2 8.9   Liver Function Tests No results for input(s): AST, ALT, ALKPHOS, BILITOT, PROT, ALBUMIN in the last 72 hours. No results for input(s): LIPASE, AMYLASE in the last 72 hours. Cardiac Enzymes No results for input(s): CKTOTAL, CKMB, CKMBINDEX, TROPONINI in the last 72 hours.  BNP: BNP (last 3 results) Recent Labs    06/01/19 1413 06/23/19 1527 12/25/19 1757  BNP 531.4* 613.2* 776.7*    ProBNP (last 3 results) No results for input(s): PROBNP in the last 8760 hours.   D-Dimer No results for input(s): DDIMER in the last 72 hours. Hemoglobin A1C No results for input(s): HGBA1C in the last 72 hours. Fasting Lipid Panel No results for input(s): CHOL, HDL, LDLCALC, TRIG, CHOLHDL, LDLDIRECT in the last 72 hours. Thyroid Function Tests No results for input(s): TSH, T4TOTAL, T3FREE, THYROIDAB in the last 72 hours.  Invalid input(s): FREET3  Other results:   Imaging    No results found.   Medications:     Scheduled Medications: . amiodarone  200 mg Oral BID  . apixaban  5 mg Oral BID  . atorvastatin  40 mg Oral QPM  . Chlorhexidine Gluconate Cloth  6 each Topical Daily  . docusate sodium  100 mg Oral BID  . feeding supplement  237 mL Oral TID BM  . furosemide  80 mg Intravenous Once  . gabapentin  300 mg Oral BID  . insulin aspart  0-9 Units Subcutaneous TID WC  . melatonin  3 mg Oral QHS  . pantoprazole  40 mg Oral Daily  . potassium chloride  20 mEq Oral Once  . sodium chloride flush  10-40 mL Intracatheter Q12H  . sodium chloride flush  3 mL Intravenous Q12H  . torsemide  60 mg Oral Daily    Infusions: .  ceFAZolin (ANCEF) IV 2 g (01/17/20 0952)  . DOBUTamine 3 mcg/kg/min (01/16/20 0223)    PRN  Medications: acetaminophen, ALPRAZolam, calcium carbonate, ondansetron (ZOFRAN) IV, polyethylene glycol, sodium chloride flush    Patient Profile  69 y.o. male with a hx of chronic systolic HF 2/2 NICM s/p BSci ICD, persistent atrial fibrillation, h/o stroke (Lg L MCA infarct w/ hemorrhagic conversion in 2018), HTN, HCV, tobacco abuse (smokes ~1/4 ppd),  and chronic cholecystitis with a chronic perc drain.   Admitted with ADHF. Started on milrinone>> later transitioned to DBA for persistently low co-ox.    Assessment/Plan   1. Acute on chronic systolic biventricular  HF due to NICM -> cardiogenic shock - suspect AF cardiomyopathy - ICD placed Nov 2015 - [facility] 10/2018 showed normal cors, elevated filling pressures and low CI at 1.9  - TTE 11/2018 LVEF 20%, mild RV dysfunction - Admit 06/2019 with severe HF, required IABP and milrinone. TEE 5/21 EF 20-25% with moderate to severe RV dysfunction.  - Admitted 12/25/2019 with ADHF. Milrinone 0.25 mcg started 11/7 due to low output HF. Echo 12/28/19 EF 20-25% RV mildly decreased. Moved to ICU on 11/8 due to persistently low co-ox on milrinone. 12/29/19: Continued w/ persistently low co-ox despite milrinone 0.5 and NE which was stopped d/t elevated MAPs. Gave metolazone 2/2 low UOP. Transitioned to dobutamine gtt starting at 2.5 mcg then increased to 3 mcg. Required NE but has been off since 11/11.  - Suspect RV function worse than it looks on echo.  - Back on dobutamine 3 with co-ox 64%, continue (failed wean).   - Now on torsemide 60 mg daily.  CVP 15, creatinine lower today at 2.3.  With RV failure, suspect will need to aim for CVP 10-12 range.  I will give him an extra dose of Lasix 80 mg IV x 1 this evening.     - No beta blockers due to low output HF - No dig or ACE/ARB/ARNI with worsening renal function.  - Options remain very limited - poor LVAD candidate with noncompliance and renal failure.  - Plan home with dobutamine once wound site stable.  PICC in place.  - Main issue seems to be RV failure. Hard to know what endpoint will be a this time. Hopefully will have some EF improvement with maintaining NSR.    2. Concerning for acute ischemic R MCA -hx of large R MCA with hemorrhagic conversion in Sept 2020 & L MCA s/p thrombectomy M2 occlusion in 2018.  -11/10 around 14:00 developed new L facial droop, LUE weakness and slurred speech - Code Stroke called.  Neuro recs appreciated, did not tolerate change in ICD settings for MRI/MRA; Repeat CT on 11/12 no acute findings, prior remote R MCA infarct. - A1c 4.6%,  LDL 63, HDL 26, Cholesterol 103, Triglycerides 68 - Atrovastatin dose increased from home dose.  - No change  3.  Persistent Afib with RVR - In 5/21 had refractory AF requiring multiple DC-CVs, high-dose amio and ranexa.  -He reported intermittent compliance with amio and Eliquis. Out of eliquis prior to admission. Transitioned from eliquis to heparin gtt due to possible need for procedures on 11/9 and planned for TEE DC-CV was scheduled 11/10, however canceled due to new neuro symptoms. - Stopped ranexa on 11/9. - DCCV 11/17 converted to SR. - Back in AF on 11/25 transiently. Converted with IV amio 150 x 1 - Continue amio 200 bid, in NSR today.  - Not candidate for AV node ablation CRT currently with possible infection of GB drain tract - Continue with eliquis 5 mg BID.  - No bleeding   4. Acute on CKD IIIb - Baseline Cr looks to be around 1.6 to 1.7 - Admission Cr 1.69 and Peak Cr 2.9 - Creatinine 2.35 -> 2.08 -> 2.22 -> 2.5 -> 2.3 - Likely ATN/cardiorenal - Continue dobutamine at 3 for support - Continue to monitor BMET daily   5. Elevated troponin - 10/2018 cath clean coronaries - elevated trops due to afib with RVR, CHF   6. Chronic cholecystitis with perc drain - Continues with increasing drainage at former drain site. Remains AF with normal WBCs.  - CT 11/25 suggested of tract infection +/- hepatic abscess -  Wound cx with few Klebsiella and S aureus  - General surgery has seen, cauterized site, did not think active infection and recommend stay off abx (seen today).    7. Iron deficiency anemia - Iron sats low and has received feraheme. - Hgb stable      Loralie Champagne, MD  11:19 AM

## 2020-01-17 NOTE — Progress Notes (Signed)
Progress Note  4 Days Post-Op  Subjective: Continues to have drainage from the tract site.  Remains afebrile.  No leukocytosis.  Objective: Vital signs in last 24 hours: Temp:  [97.4 F (36.3 C)-98.5 F (36.9 C)] 97.8 F (36.6 C) (11/28 0826) Pulse Rate:  [73-79] 75 (11/28 0826) Resp:  [16-23] 20 (11/28 0826) BP: (109-127)/(61-85) 111/85 (11/28 0826) SpO2:  [95 %-97 %] 95 % (11/28 0826) Weight:  [75.2 kg] 75.2 kg (11/28 0324) Last BM Date: 01/16/20  Intake/Output from previous day: 11/27 0701 - 11/28 0700 In: 319.6 [P.O.:240; I.V.:79.6] Out: 1260 [Urine:1260] Intake/Output this shift: No intake/output data recorded.  PE: Approximately 1 cm circumference budding granulation tissue at prior drain tract with odorless tan opaque drainage.  No surrounding erythema, induration, warmth or tenderness although the granulation tissue itself is tender.  Granulation tissue cauterized again today.     Lab Results:  Recent Labs    01/15/20 0430 01/17/20 0342  WBC 5.5 4.5  HGB 8.7* 8.6*  HCT 28.3* 27.3*  PLT 186 182   BMET Recent Labs    01/16/20 0453 01/17/20 0342  NA 139 139  K 4.0 3.9  CL 101 100  CO2 27 28  GLUCOSE 99 125*  BUN 28* 25*  CREATININE 2.50* 2.31*  CALCIUM 9.2 8.9   PT/INR No results for input(s): LABPROT, INR in the last 72 hours. CMP     Component Value Date/Time   NA 139 01/17/2020 0342   NA 140 09/01/2019 1056   K 3.9 01/17/2020 0342   CL 100 01/17/2020 0342   CO2 28 01/17/2020 0342   GLUCOSE 125 (H) 01/17/2020 0342   BUN 25 (H) 01/17/2020 0342   BUN 18 09/01/2019 1056   CREATININE 2.31 (H) 01/17/2020 0342   CREATININE 1.40 (H) 12/03/2017 1407   CALCIUM 8.9 01/17/2020 0342   PROT 6.5 12/31/2019 1540   PROT 7.1 08/26/2019 1334   ALBUMIN 3.1 (L) 12/31/2019 1540   ALBUMIN 4.3 09/01/2019 1056   AST 42 (H) 12/31/2019 1540   ALT 132 (H) 12/31/2019 1540   ALT 45 05/21/2017 1520   ALKPHOS 88 12/31/2019 1540   BILITOT 1.3 (H) 12/31/2019  1540   BILITOT 0.3 08/26/2019 1334   GFRNONAA 30 (L) 01/17/2020 0342   GFRNONAA 69 05/21/2017 1510   GFRAA 51 (L) 11/10/2019 1210   GFRAA 80 05/21/2017 1510   Lipase     Component Value Date/Time   LIPASE 31 12/01/2019 0900       Studies/Results: No results found.  Anti-infectives: Anti-infectives (From admission, onward)   Start     Dose/Rate Route Frequency Ordered Stop   01/17/20 1000  ceFAZolin (ANCEF) IVPB 2g/100 mL premix        2 g 200 mL/hr over 30 Minutes Intravenous Every 12 hours 01/17/20 0751     01/14/20 2230  piperacillin-tazobactam (ZOSYN) IVPB 3.375 g  Status:  Discontinued        3.375 g 12.5 mL/hr over 240 Minutes Intravenous Every 8 hours 01/14/20 2141 01/15/20 1033       Assessment/Plan HTN HLD Acute on Chronic HF 2/2 NICM w/ AICD (EF 20-25% 11/8) - per Cards Hep C Hx of CVA Hx of DVT s/p IVC filter  A. Fib on Eliquis Chronic Kidney Disease - Cr 2.22 - Per Primary Team -   Hx of Acute Cholecystitis 12/12/2018 s/p Perc Chole Drain by IR 12/13/2018 Drainage from prior Perc Chole drain site  - This is a 69 year old male with multiple  medical problems as listed above who we are asked to see for problems with patients Perc Chole drain. Drain was previously placed by IR on 12/13/2018 after patient was hospitalized with acute cholecystitis. During patients last drain exchange on 09/23/2019 the gallbladder was significantly contracted and scarred, and a 10 French drain would not form within the gallbladder.  Patient was to follow-up with Dr. Zenia Resides in the office but missed his last appointments.  -Patient may have underlying fistula from prior perc chole drain site but currently is without any abdominal pain/tenderness, normal wbc and afebrile. Would recommend continuing silver nitrate to hypergranulated tissue. Patient does not need further abx with no leukocytosis and afebrile. We will follow while admitted and set up to be seen again in the office next week  if discharged.  -If he develops abdominal pain or clinical s/s of infection would recommend re-scanning with IV contrast if possible  FEN - HH diet  VTE - SCDs, eliquis ID - Zosyn 11/25>11/26  LOS: 83 days    Clovis Riley , MD [facility] 01/17/2020, 8:56 AM Please see Amion to page on call team

## 2020-01-17 NOTE — Plan of Care (Signed)
  Problem: Education: Goal: Knowledge of disease or condition will improve Outcome: Progressing Goal: Understanding of medication regimen will improve Outcome: Progressing Goal: Individualized Educational Video(s) Outcome: Progressing   Problem: Activity: Goal: Ability to tolerate increased activity will improve Outcome: Progressing   Problem: Cardiac: Goal: Ability to achieve and maintain adequate cardiopulmonary perfusion will improve Outcome: Progressing   Problem: Health Behavior/Discharge Planning: Goal: Ability to safely manage health-related needs after discharge will improve Outcome: Progressing   Problem: Education: Goal: Ability to demonstrate management of disease process will improve Outcome: Progressing Goal: Ability to verbalize understanding of medication therapies will improve Outcome: Progressing Goal: Individualized Educational Video(s) Outcome: Progressing   Problem: Activity: Goal: Capacity to carry out activities will improve Outcome: Progressing   Problem: Coping: Goal: Level of anxiety will decrease Outcome: Progressing   Problem: Nutrition: Goal: Adequate nutrition will be maintained Outcome: Progressing   Problem: Elimination: Goal: Will not experience complications related to bowel motility Outcome: Progressing Goal: Will not experience complications related to urinary retention Outcome: Progressing   Problem: Pain Managment: Goal: General experience of comfort will improve Outcome: Progressing   Problem: Safety: Goal: Ability to remain free from injury will improve Outcome: Progressing   Problem: Skin Integrity: Goal: Risk for impaired skin integrity will decrease Outcome: Progressing

## 2020-01-17 NOTE — Plan of Care (Signed)
°  Problem: Education: Goal: Knowledge of disease or condition will improve Outcome: Progressing Goal: Understanding of medication regimen will improve Outcome: Progressing Goal: Individualized Educational Video(s) Outcome: Progressing   Problem: Activity: Goal: Ability to tolerate increased activity will improve Outcome: Progressing   Problem: Cardiac: Goal: Ability to achieve and maintain adequate cardiopulmonary perfusion will improve Outcome: Progressing   Problem: Health Behavior/Discharge Planning: Goal: Ability to safely manage health-related needs after discharge will improve Outcome: Progressing   Problem: Education: Goal: Ability to demonstrate management of disease process will improve Outcome: Progressing Goal: Ability to verbalize understanding of medication therapies will improve Outcome: Progressing Goal: Individualized Educational Video(s) Outcome: Progressing   Problem: Activity: Goal: Capacity to carry out activities will improve Outcome: Progressing   Problem: Cardiac: Goal: Ability to achieve and maintain adequate cardiopulmonary perfusion will improve Outcome: Progressing   Problem: Education: Goal: Knowledge of General Education information will improve Description: Including pain rating scale, medication(s)/side effects and non-pharmacologic comfort measures Outcome: Progressing   Problem: Health Behavior/Discharge Planning: Goal: Ability to manage health-related needs will improve Outcome: Progressing   Problem: Clinical Measurements: Goal: Ability to maintain clinical measurements within normal limits will improve Outcome: Progressing Goal: Will remain free from infection Outcome: Progressing Goal: Diagnostic test results will improve Outcome: Progressing Goal: Respiratory complications will improve Outcome: Progressing Goal: Cardiovascular complication will be avoided Outcome: Progressing   Problem: Activity: Goal: Risk for activity  intolerance will decrease Outcome: Progressing   Problem: Nutrition: Goal: Adequate nutrition will be maintained Outcome: Progressing   Problem: Coping: Goal: Level of anxiety will decrease Outcome: Progressing   Problem: Elimination: Goal: Will not experience complications related to bowel motility Outcome: Progressing Goal: Will not experience complications related to urinary retention Outcome: Progressing   Problem: Pain Managment: Goal: General experience of comfort will improve Outcome: Progressing   Problem: Safety: Goal: Ability to remain free from injury will improve Outcome: Progressing

## 2020-01-18 DIAGNOSIS — I4819 Other persistent atrial fibrillation: Secondary | ICD-10-CM | POA: Diagnosis not present

## 2020-01-18 LAB — COOXEMETRY PANEL
Carboxyhemoglobin: 2 % — ABNORMAL HIGH (ref 0.5–1.5)
Methemoglobin: 0.8 % (ref 0.0–1.5)
O2 Saturation: 63.7 %
Total hemoglobin: 9 g/dL — ABNORMAL LOW (ref 12.0–16.0)

## 2020-01-18 LAB — CBC
HCT: 28.9 % — ABNORMAL LOW (ref 39.0–52.0)
Hemoglobin: 9 g/dL — ABNORMAL LOW (ref 13.0–17.0)
MCH: 29.3 pg (ref 26.0–34.0)
MCHC: 31.1 g/dL (ref 30.0–36.0)
MCV: 94.1 fL (ref 80.0–100.0)
Platelets: 194 10*3/uL (ref 150–400)
RBC: 3.07 MIL/uL — ABNORMAL LOW (ref 4.22–5.81)
RDW: 17.2 % — ABNORMAL HIGH (ref 11.5–15.5)
WBC: 4.5 10*3/uL (ref 4.0–10.5)
nRBC: 0 % (ref 0.0–0.2)

## 2020-01-18 LAB — BASIC METABOLIC PANEL
Anion gap: 11 (ref 5–15)
BUN: 23 mg/dL (ref 8–23)
CO2: 28 mmol/L (ref 22–32)
Calcium: 8.8 mg/dL — ABNORMAL LOW (ref 8.9–10.3)
Chloride: 99 mmol/L (ref 98–111)
Creatinine, Ser: 2.34 mg/dL — ABNORMAL HIGH (ref 0.61–1.24)
GFR, Estimated: 30 mL/min — ABNORMAL LOW (ref >60)
Glucose, Bld: 141 mg/dL — ABNORMAL HIGH (ref 70–99)
Potassium: 3.7 mmol/L (ref 3.5–5.1)
Sodium: 138 mmol/L (ref 135–145)

## 2020-01-18 LAB — GLUCOSE, CAPILLARY
Glucose-Capillary: 103 mg/dL — ABNORMAL HIGH (ref 70–99)
Glucose-Capillary: 101 mg/dL — ABNORMAL HIGH (ref 70–99)

## 2020-01-18 MED ORDER — TORSEMIDE 20 MG PO TABS: 60.0000 mg | ORAL_TABLET | Freq: Every day | ORAL | 5 refills | Status: DC

## 2020-01-18 MED ORDER — DOBUTAMINE IN D5W 4-5 MG/ML-% IV SOLN: 3.0000 ug/kg/min | INTRAVENOUS | 3 refills | Status: DC

## 2020-01-18 MED ORDER — POTASSIUM CHLORIDE CRYS ER 20 MEQ PO TBCR: 20.0000 meq | EXTENDED_RELEASE_TABLET | Freq: Every day | ORAL | 5 refills | Status: DC

## 2020-01-18 MED ORDER — APIXABAN 5 MG PO TABS: 5.0000 mg | ORAL_TABLET | Freq: Two times a day (BID) | ORAL | 3 refills | Status: DC

## 2020-01-18 MED ORDER — AMIODARONE HCL 200 MG PO TABS: 200.0000 mg | ORAL_TABLET | Freq: Two times a day (BID) | ORAL | 5 refills | Status: DC

## 2020-01-18 MED ORDER — POTASSIUM CHLORIDE CRYS ER 20 MEQ PO TBCR
40.0000 meq | EXTENDED_RELEASE_TABLET | Freq: Once | ORAL | Status: AC
  Administered 2020-01-18: 40 meq via ORAL
  Filled 2020-01-18: qty 2

## 2020-01-18 MED ORDER — ATORVASTATIN CALCIUM 40 MG PO TABS: 40.0000 mg | ORAL_TABLET | Freq: Every evening | ORAL | 5 refills | Status: DC

## 2020-01-18 NOTE — Progress Notes (Signed)
Physical Therapy Treatment Patient Details Name: Glenn [name] MRN: [id number] DOB: Nov 15, 1950 Today's Date: 01/18/2020    History of Present Illness Glenn [name] is a 69 y.o. male with a hx of chronic systolic HF 2/2 NICM s/p BSci ICD, atrial fibrillation, h/o stroke (L MCA 2018 and R MCA 2020), h/o DVT, chornic a/c therapy, HTN, HLD, pulmonary nodule, Hepatitis C, carpal tunnel,  tobacco abuse (smokes ~1/4 ppd) and chronic cholecystitis with a chronic perc drain.  Pt presented with dizziness and shortness of breath and was admitted to hospital with Afib RVR and CHF.  On 12/30/19 pt with new L facial droop and L weakness - CT was negative for acute changes and pt unable to have MRI.    PT Comments    Pt supine in bed and agreeable to PT session.  He has met all acute care goals with plans to d/c later this pm.  Pt continues to benefit from HHPT to continue to improve function and reduce need for RW use.     Follow Up Recommendations  Home health PT;Supervision for mobility/OOB     Equipment Recommendations       Recommendations for Other Services       Precautions / Restrictions Precautions Precautions: Fall Precaution Comments: watch HR Restrictions Weight Bearing Restrictions: No    Mobility  Bed Mobility Overal bed mobility: Modified Independent       Supine to sit: Modified independent (Device/Increase time);HOB elevated Sit to supine: Modified independent (Device/Increase time);HOB elevated      Transfers Overall transfer level: Modified independent Equipment used: None Transfers: Sit to/from Stand              Ambulation/Gait Ambulation/Gait assistance: Modified independent (Device/Increase time) Gait Distance (Feet): 300 Feet Assistive device: Rolling walker (2 wheeled) Gait Pattern/deviations: Decreased weight shift to left;Decreased step length - right;Decreased dorsiflexion - left;Drifts right/left     General Gait Details: No  standing rest breaks and tolerated distance well.  Normal gt pattern noted   Stairs Stairs: Yes Stairs assistance: Supervision Stair Management: One rail Right Number of Stairs: 6 General stair comments: Cues for safety.   Wheelchair Mobility    Modified Rankin (Stroke Patients Only)       Balance Overall balance assessment: Needs assistance   Sitting balance-Leahy Scale: Good       Standing balance-Leahy Scale: Fair Standing balance comment: Able to static stand without UE support; RW for ambulation                            Cognition Arousal/Alertness: Awake/alert Behavior During Therapy: WFL for tasks assessed/performed Overall Cognitive Status: Within Functional Limits for tasks assessed                                 General Comments: A/O x4      Exercises      General Comments        Pertinent Vitals/Pain Pain Assessment: No/denies pain    Home Living                      Prior Function            PT Goals (current goals can now be found in the care plan section) Acute Rehab PT Goals Patient Stated Goal: return home Potential to Achieve Goals: Good Additional Goals Additional Goal #1: Pt  will score > 19 on DGI to indicate low fall risk Progress towards PT goals: Progressing toward goals    Frequency    Min 3X/week      PT Plan Current plan remains appropriate    Co-evaluation              AM-PAC PT "6 Clicks" Mobility   Outcome Measure  Help needed turning from your back to your side while in a flat bed without using bedrails?: None Help needed moving from lying on your back to sitting on the side of a flat bed without using bedrails?: None Help needed moving to and from a bed to a chair (including a wheelchair)?: None Help needed standing up from a chair using your arms (e.g., wheelchair or bedside chair)?: None Help needed to walk in hospital room?: None Help needed climbing 3-5 steps  with a railing? : None 6 Click Score: 24    End of Session Equipment Utilized During Treatment: Gait belt Activity Tolerance: Patient tolerated treatment well Patient left: in bed;with call bell/phone within reach Nurse Communication: Mobility status PT Visit Diagnosis: Difficulty in walking, not elsewhere classified (R26.2)     Time: 1100-3496 PT Time Calculation (min) (ACUTE ONLY): 14 min  Charges:  $Gait Training: 8-22 mins                     [name] , PTA Acute Rehabilitation Services Pager [phone] Office [phone]     Makai Dumond Eli Hose 01/18/2020, 12:49 PM

## 2020-01-18 NOTE — Progress Notes (Signed)
Mr. Pistilli was provided discharge instructions by myself and Gwenyth Bouillon, RN. He verbalized understanding and stated that he had no other questions. He was then wheeled out to the pickup area where his son was waiting in the car. He was escorted inside the car and subsequently seen drive away from premises.

## 2020-01-18 NOTE — TOC Transition Note (Addendum)
Transition of Care [facility]) - CM/SW Discharge Note   Patient Details  Name: Glenn [name] MRN: [id number] Date of Birth: [birth year]  [facility] CM/SW Contact:  Zenon Mayo, RN Phone Number: 01/18/2020, 10:18 AM   Clinical Narrative:    Patient is for dc today, NCM contacted Ramond Marrow with [facility] to notify, she states Orville Govern will be doing the IV abx and dobutamine til 12/5 and then [facility] will pick up on 12/6.  NCM notified Pam chandler of the dc today, waiting to hear back from [facility] to make sure patient is good for dc. Per Pam she will hook him up this afternoon.    Final next level of care: [place] Barriers to Discharge: No Barriers Identified   Patient Goals and CMS Choice Patient states their goals for this hospitalization and ongoing recovery are:: to go home      Discharge Placement                       Discharge Plan and Services                          HH Arranged: RN, PT, OT [facility] Agency: [place]) Orville Govern) Date [facility] Agency Contacted: 01/15/20 Time HH Agency Contacted: 1000 Representative spoke with at [place]: Pam/Kenzie  Social Determinants of Health ([place]) Interventions     Readmission Risk Interventions Readmission Risk Prevention Plan 07/15/2019 02/05/2019  Transportation Screening Complete Complete  Medication Review [employer]) - Complete  PCP or Specialist appointment within 3-5 days of discharge - Complete  HRI or [place] - Complete  SW Recovery Care/Counseling Consult Complete Complete  Palliative Care Screening Complete Not [place] Complete Not Applicable  Some recent data might be hidden

## 2020-01-18 NOTE — Plan of Care (Signed)
  Problem: Education: Goal: Knowledge of disease or condition will improve Outcome: Progressing Goal: Understanding of medication regimen will improve Outcome: Progressing Goal: Individualized Educational Video(s) Outcome: Progressing   Problem: Activity: Goal: Ability to tolerate increased activity will improve Outcome: Progressing   Problem: Cardiac: Goal: Ability to achieve and maintain adequate cardiopulmonary perfusion will improve Outcome: Progressing   Problem: Health Behavior/Discharge Planning: Goal: Ability to safely manage health-related needs after discharge will improve Outcome: Progressing   Problem: Education: Goal: Ability to demonstrate management of disease process will improve Outcome: Progressing Goal: Ability to verbalize understanding of medication therapies will improve Outcome: Progressing Goal: Individualized Educational Video(s) Outcome: Progressing   Problem: Activity: Goal: Capacity to carry out activities will improve Outcome: Progressing   Problem: Cardiac: Goal: Ability to achieve and maintain adequate cardiopulmonary perfusion will improve Outcome: Progressing   Problem: Pain Managment: Goal: General experience of comfort will improve Outcome: Progressing   Problem: Safety: Goal: Ability to remain free from injury will improve Outcome: Progressing   Problem: Skin Integrity: Goal: Risk for impaired skin integrity will decrease Outcome: Progressing

## 2020-01-18 NOTE — Progress Notes (Signed)
Patient ID: Glenn [name], male   DOB: May 05, 1950, 69 y.o.   MRN: [id number]     Advanced Heart Failure Rounding Note  PCP-Cardiologist: Virl Axe, MD   Subjective:    Yesterday: CVP 15, co-ox 64%, on torsemide 60 daily, given lasix 80 mg IV x 1;  Wound culture grew few Klebsiella and S aureus started yesterday on cefazolin.  Surgery following.    Remains on dobutamine 3.  Co-ox 64% and CVP 9.  Denies chest pain, shortness of breath.    Objective:   Weight Range: 74.5 kg Body mass index is 25.72 kg/m.   Vital Signs:   Temp:  [97.7 F (36.5 C)-98.4 F (36.9 C)] 98.4 F (36.9 C) (11/29 0305) Pulse Rate:  [75-81] 81 (11/28 1928) Resp:  [15-23] 23 (11/29 0305) BP: (93-126)/(48-85) 119/58 (11/29 0305) SpO2:  [95 %-97 %] 95 % (11/29 0305) Weight:  [74.5 kg] 74.5 kg (11/29 0305) Last BM Date: 01/16/20  Weight change: Filed Weights   01/16/20 0546 01/17/20 0324 01/18/20 0305  Weight: 75.1 kg 75.2 kg 74.5 kg    Intake/Output:   Intake/Output Summary (Last 24 hours) at 01/18/2020 0717 Last data filed at 01/18/2020 0630 Gross per 24 hour  Intake 982.69 ml  Output 2450 ml  Net -1467.31 ml      Physical Exam   CVP 9 General:  Well appearing. No resp difficulty HEENT: normal Neck: supple. + JVD. Carotids 2+ bilat; no bruits. No lymphadenopathy or thryomegaly appreciated. Cor: PMI nondisplaced. Regular rate & rhythm. No rubs, or gallops. + murmurs. Lungs: clear Abdomen: soft, nontender, nondistended. No hepatosplenomegaly. No bruits or masses. Good bowel sounds. Scant amount of bloody drainage.  Extremities: no cyanosis, clubbing, rash, edema Neuro: alert & oriented x 3, cranial nerves grossly intact. moves all 4 extremities w/o difficulty. Affect pleasant   Telemetry   1st AVB, PR 0.22.  Rates 63s.  Personally reviewed.    Labs    CBC Recent Labs    01/17/20 0342 01/18/20 0257  WBC 4.5 4.5  HGB 8.6* 9.0*  HCT 27.3* 28.9*  MCV 94.8 94.1  PLT 182 638    Basic Metabolic Panel Recent Labs    01/17/20 0342 01/18/20 0257  NA 139 138  K 3.9 3.7  CL 100 99  CO2 28 28  GLUCOSE 125* 141*  BUN 25* 23  CREATININE 2.31* 2.34*  CALCIUM 8.9 8.8*   Liver Function Tests No results for input(s): AST, ALT, ALKPHOS, BILITOT, PROT, ALBUMIN in the last 72 hours. No results for input(s): LIPASE, AMYLASE in the last 72 hours. Cardiac Enzymes No results for input(s): CKTOTAL, CKMB, CKMBINDEX, TROPONINI in the last 72 hours.  BNP: BNP (last 3 results) Recent Labs    06/01/19 1413 06/23/19 1527 12/25/19 1757  BNP 531.4* 613.2* 776.7*    ProBNP (last 3 results) No results for input(s): PROBNP in the last 8760 hours.   D-Dimer No results for input(s): DDIMER in the last 72 hours. Hemoglobin A1C No results for input(s): HGBA1C in the last 72 hours. Fasting Lipid Panel No results for input(s): CHOL, HDL, LDLCALC, TRIG, CHOLHDL, LDLDIRECT in the last 72 hours. Thyroid Function Tests No results for input(s): TSH, T4TOTAL, T3FREE, THYROIDAB in the last 72 hours.  Invalid input(s): FREET3  Other results:   Imaging    No results found.   Medications:     Scheduled Medications: . amiodarone  200 mg Oral BID  . apixaban  5 mg Oral BID  . atorvastatin  40 mg Oral QPM  . Chlorhexidine Gluconate Cloth  6 each Topical Daily  . docusate sodium  100 mg Oral BID  . feeding supplement  237 mL Oral TID BM  . gabapentin  300 mg Oral BID  . insulin aspart  0-9 Units Subcutaneous TID WC  . melatonin  3 mg Oral QHS  . pantoprazole  40 mg Oral Daily  . potassium chloride  40 mEq Oral Once  . sodium chloride flush  10-40 mL Intracatheter Q12H  . sodium chloride flush  3 mL Intravenous Q12H  . torsemide  60 mg Oral Daily    Infusions: .  ceFAZolin (ANCEF) IV 2 g (01/17/20 2123)  . DOBUTamine 3 mcg/kg/min (01/16/20 0223)    PRN Medications: acetaminophen, ALPRAZolam, calcium carbonate, ondansetron (ZOFRAN) IV, polyethylene glycol,  sodium chloride flush    Patient Profile  69 y.o. male with a hx of chronic systolic HF 2/2 NICM s/p BSci ICD, persistent atrial fibrillation, h/o stroke (Lg L MCA infarct w/ hemorrhagic conversion in 2018), HTN, HCV, tobacco abuse (smokes ~1/4 ppd),  and chronic cholecystitis with a chronic perc drain.   Admitted with ADHF. Started on milrinone>> later transitioned to DBA for persistently low co-ox.    Assessment/Plan   1. Acute on chronic systolic biventricular  HF due to NICM -> cardiogenic shock - suspect AF cardiomyopathy - ICD placed Nov 2015 - [facility] 10/2018 showed normal cors, elevated filling pressures and low CI at 1.9  - TTE 11/2018 LVEF 20%, mild RV dysfunction - Admit 06/2019 with severe HF, required IABP and milrinone. TEE 5/21 EF 20-25% with moderate to severe RV dysfunction.  - Admitted 12/25/2019 with ADHF. Milrinone 0.25 mcg started 11/7 due to low output HF. Echo 12/28/19 EF 20-25% RV mildly decreased. Moved to ICU on 11/8 due to persistently low co-ox on milrinone. 12/29/19: Continued w/ persistently low co-ox despite milrinone 0.5 and NE which was stopped d/t elevated MAPs. Gave metolazone 2/2 low UOP. Transitioned to dobutamine gtt starting at 2.5 mcg then increased to 3 mcg. Required NE but has been off since 11/11.  - Suspect RV function worse than it looks on echo.  - On dobutamine 3 (failed wean).   - c/w torsemide 60.  CVP 9, Cr 2.34   - With RV failure, suspect will need to aim for CVP 10-12 range.   - No beta blockers due to low output HF - No dig or ACE/ARB/ARNI with worsening renal function.  - Options remain very limited - poor LVAD candidate with noncompliance and renal failure.  - Plan home with dobutamine. PICC in place.  - Main issue seems to be RV failure. Hard to know what endpoint will be a this time. Hopefully will have some EF improvement with maintaining NSR.    2. Concerning for acute ischemic R MCA -hx of large R MCA with hemorrhagic conversion in  Sept 2020 & L MCA s/p thrombectomy M2 occlusion in 2018.  -11/10 around 14:00 developed new L facial droop, LUE weakness and slurred speech - Code Stroke called.  Neuro recs appreciated, did not tolerate change in ICD settings for MRI/MRA; Repeat CT on 11/12 no acute findings, prior remote R MCA infarct. - A1c 4.6%, LDL 63, HDL 26, Cholesterol 103, Triglycerides 68 - C/w Atrovastatin dose - No change  3.  Persistent Afib with RVR - In 5/21 had refractory AF requiring multiple DC-CVs, high-dose amio and ranexa.  -He reported intermittent compliance with amio and Eliquis. Out of eliquis prior  to admission. Transitioned from eliquis to heparin gtt due to possible need for procedures on 11/9 and planned for TEE DC-CV was scheduled 11/10, however canceled due to new neuro symptoms. - Stopped ranexa on 11/9. - DCCV 11/17 converted to SR. - Back in AF on 11/25 transiently. Converted with IV amio 150 x 1 - Continue amio 200 bid  - Not candidate for AV node ablation CRT currently with possible infection of GB drain tract - Continue with eliquis 5 mg BID.  - No bleeding   4. Acute on CKD IIIb - Baseline Cr looks to be around 1.6 to 1.7 - Admission Cr 1.69 and Peak Cr 2.9 - Creatinine 2.35 -> 2.08 -> 2.22 -> 2.5 -> 2.3 - Likely ATN/cardiorenal - Continue dobutamine at 3 for support  5. Elevated troponin - 10/2018 cath clean coronaries - elevated trops due to afib with RVR, CHF   6. Chronic cholecystitis with perc drain - Continues with increasing drainage at former drain site. Remains AF with normal WBCs.  - CT 11/25 suggested of tract infection +/- hepatic abscess - Wound cx with few Klebsiella and S aureus; stopped cefazolin. - General surgery has seen, cauterized site, did not think active infection and recommend stay off abx.    7. Iron deficiency anemia - Iron sats low and has received feraheme. - Hgb stable      Carlene Coria, NP  7:17 AM   Patient seen and examined with the  above-signed Advanced Practice Provider and/or Housestaff. I personally reviewed laboratory data, imaging studies and relevant notes. I independently examined the patient and formulated the important aspects of the plan. I have edited the note to reflect any of my changes or salient points. I have personally discussed the plan with the patient and/or family.  Looks good today. Co-ox and CVP stable. Remains in NSR. Still with drainage from perc tube tract. GSU has seen and doesn't believe it is infected.   General:  Well appearing. No resp difficulty HEENT: normal Neck: supple. no JVD. Carotids 2+ bilat; no bruits. No lymphadenopathy or thryomegaly appreciated. Cor: PMI nondisplaced. Regular rate & rhythm. No rubs, gallops or murmurs. Lungs: clear Abdomen: soft, mildly tender around perc tube tract. No rebound  nondistended. No hepatosplenomegaly. No bruits or masses. Good bowel sounds. Extremities: no cyanosis, clubbing, rash, edema Neuro: alert & orientedx3, cranial nerves grossly intact. moves all 4 extremities w/o difficulty. Affect pleasant  Ok for d/c home today on dobutamine at 3. Will need close f/u in HF and GSU clinics.   Glori Bickers, MD  9:29 AM

## 2020-01-18 NOTE — Progress Notes (Signed)
5 Days Post-Op  Subjective: No complaints.  Still apparently in and out of a fib, currently NSR.  No abdominal pain.  Tolerating diet.  ROS: See above, otherwise other systems negative  Objective: Vital signs in last 24 hours: Temp:  [97.7 F (36.5 C)-98.4 F (36.9 C)] 98.4 F (36.9 C) (11/29 0305) Pulse Rate:  [75-81] 81 (11/28 1928) Resp:  [15-23] 23 (11/29 0305) BP: (93-126)/(48-85) 119/58 (11/29 0305) SpO2:  [95 %-97 %] 95 % (11/29 0305) Weight:  [74.5 kg] 74.5 kg (11/29 0305) Last BM Date: 01/16/20  Intake/Output from previous day: 11/28 0701 - 11/29 0700 In: 982.7 [P.O.:713; I.V.:69.7; IV Piggyback:200] Out: 2450 [Urine:2450] Intake/Output this shift: No intake/output data recorded.  PE: Abd: soft, NT, ND, +BS, RUQ drain site with some fibrinous exudate type drainage.  Hypergranulation tissue present and recauterized today with a silver nitrate stick.  No infection noted.  Lab Results:  Recent Labs    01/17/20 0342 01/18/20 0257  WBC 4.5 4.5  HGB 8.6* 9.0*  HCT 27.3* 28.9*  PLT 182 194   BMET Recent Labs    01/17/20 0342 01/18/20 0257  NA 139 138  K 3.9 3.7  CL 100 99  CO2 28 28  GLUCOSE 125* 141*  BUN 25* 23  CREATININE 2.31* 2.34*  CALCIUM 8.9 8.8*   PT/INR No results for input(s): LABPROT, INR in the last 72 hours. CMP     Component Value Date/Time   NA 138 01/18/2020 0257   NA 140 09/01/2019 1056   K 3.7 01/18/2020 0257   CL 99 01/18/2020 0257   CO2 28 01/18/2020 0257   GLUCOSE 141 (H) 01/18/2020 0257   BUN 23 01/18/2020 0257   BUN 18 09/01/2019 1056   CREATININE 2.34 (H) 01/18/2020 0257   CREATININE 1.40 (H) 12/03/2017 1407   CALCIUM 8.8 (L) 01/18/2020 0257   PROT 6.5 12/31/2019 1540   PROT 7.1 08/26/2019 1334   ALBUMIN 3.1 (L) 12/31/2019 1540   ALBUMIN 4.3 09/01/2019 1056   AST 42 (H) 12/31/2019 1540   ALT 132 (H) 12/31/2019 1540   ALT 45 05/21/2017 1520   ALKPHOS 88 12/31/2019 1540   BILITOT 1.3 (H) 12/31/2019 1540    BILITOT 0.3 08/26/2019 1334   GFRNONAA 30 (L) 01/18/2020 0257   GFRNONAA 69 05/21/2017 1510   GFRAA 51 (L) 11/10/2019 1210   GFRAA 80 05/21/2017 1510   Lipase     Component Value Date/Time   LIPASE 31 12/01/2019 0900       Studies/Results: No results found.  Anti-infectives: Anti-infectives (From admission, onward)   Start     Dose/Rate Route Frequency Ordered Stop   01/17/20 1000  ceFAZolin (ANCEF) IVPB 2g/100 mL premix        2 g 200 mL/hr over 30 Minutes Intravenous Every 12 hours 01/17/20 0751     01/14/20 2230  piperacillin-tazobactam (ZOSYN) IVPB 3.375 g  Status:  Discontinued        3.375 g 12.5 mL/hr over 240 Minutes Intravenous Every 8 hours 01/14/20 2141 01/15/20 1033       Assessment/Plan HTN HLD Acute on Chronic HF 2/2 NICM w/ AICD(EF 20-25% 11/8)- per Cards Hep C Hx of CVA Hx ofDVT s/p IVC filter  A. Fib on Eliquis Chronic Kidney Disease- Cr 2.22 - Per Primary Team -   Hx of Acute Cholecystitis 12/12/2018 s/p Perc Chole Drain by IR 12/13/2018 Drainage from prior Perc Chole drain site -Drain was previously placed by IR on 12/13/2018 after  patient was hospitalized with acute cholecystitis.During patients last drain exchange on 09/23/2019 thegallbladderwassignificantly contracted and scarred, and a 10 French drain would not form within the gallbladder.Patient was suppose to follow up with Dr. Kae Heller last year, but never did.  Apparently he was set up to see Dr. Zenia Resides in our office recently but failed to show due to hospitalization. -Patient does not need further abx with no leukocytosis and afebrile.  -no obvious fistula as no bilious output noted. -patient is surgically stable for DC home when felt medically stable.  We will arrange follow up in our office for the patient upon discharge. -If he develops abdominal pain or clinical s/s of infection would recommend re-scanning with IV contrast if possible  FEN -HH diet VTE -SCDs, eliquis ID  -Zosyn 11/25>11/26   LOS: 23 days    Henreitta Cea , [facility] 01/18/2020, 7:38 AM Please see Amion for pager number during day hours 7:00am-4:30pm or 7:00am -11:30am on weekends

## 2020-01-18 NOTE — Discharge Summary (Signed)
Advanced Heart Failure Team  Discharge Summary   Patient ID: Glenn [name] MRN: [id number], DOB/AGE: [birth year] 69 y.o. Admit date: 12/25/2019 D/C date:     01/18/2020   Primary Discharge Diagnoses:   1. Acute on chronic systolic biventricular HFdue to NICM - Continue with torsemide 60 mg daily. - Continue dobutamine 3 mcg outpatient with home health (PICC in place).  - Will need repeat echocardiogram outpatient in 3 months.   2.PersistentAfib with RVR - Continue amio 200 bid PO, will need to address outpatient.   - May need to consider AV node ablation CRT once improvement in [facility] site. - Continue with eliquis 5 mg BID.   3. Acute on CKD IIIb - Follow up BMP outpatient  4. Chronic cholecystitis with perc drain - Follow up Surgery outpatient   5. Iron deficiency anemia -Follow up CBC outpatient    Hospital Course: Glenn [name] a 69 y.o.malewith a hx of chronic systolic HF (EF 41-66%) 2/2 NICM s/pBSciICD (Required in May 2021 IABP, milrinone for cardiogenic shock and multiple DC-CVs, amiodarone and ranexa for afib but never followed up outpatient with heart failure); persistent atrial fibrillation, h/o stroke (Lg L MCA infarct w/ hemorrhagic conversionin 2018), HTN, HCV, tobacco abuse (smokes ~1/4 ppd) and chronic cholecystitiswith achronic perc drain (fell out 11/2019, did not follow up with surgery outpatient) presented to [facility] ED via EMS for shortness of breath and dizziness for two days prior to arrival on December 25, 2019. On arrival to the ED he was noted to be in afib w RVR as well as acute decompensated heart failure which he was admitted to [facility] Medicine and surgery was consulted to evaluate [facility] site but no acute intervention required and follow up outpatient for [facility] site. He was started on diltiazem gtt but transitioned to amiodarone gtt.  His eliquis was restarted since he missed several doses prior to hospitalization. PICC line was placed on 11/6 with  initial co-ox 31% on 11/7 and he was started on milrinone 0.25 mcg. On 11/8, he was transferred to the ICU for NE support and increased milrinone to 0.375 mcg due to low co-ox (30-40%), low UOP and high CVP readings likely related to RV dysfunction. The milrinone was increased to 0.5 mcg due to persistently low co-ox and lasix gtt started at 20.  However, milrinone was stopped due to continue persistent low cardiac output and started on dobutamine at 2.5 mcg on 11/9. At this time, eliquis was stopped and heparin gtt was started due to concern for needing mechanical support and TEE DC-CV. On 11/10, dobutamine increased to 3 mcg, Code Stroke called due to left facial droop, LUE weakness and dysarthria. Home medications: ranexa, atarax and zoloft were stopped due to prolong QTc. CT head w/o contrast showed no acute hemorrhage or ischemic changes. Neurology consulted and recommended MRI and MRA.  While in MRI on 11/11, became hypotensive and afib rates dropping down to 50s.  Amiodarone gtt was stopped, NE was restarted at 10 and dobutamine was continued at 3.5 mcg. Home medications like gabapentin dose was reduced due to worsening renal function and the development of a tremor. On 11/16, his co-ox, CVP and neuro symptoms continued to improve and he had a TEE DC-CV at bedside with prompt conversion of afib to sinus rhythm and transitioned back to eliquis on 11/18.  Tried to wean off dobutamine twice but Co-ox dropped with increase  CVP and complaints of shortness of breath and orthopnea. Dobutamine required titration back up to 3  mcg on 11/23. Palliative care was consulted to discuss goals of care. On 11/25 went back into AF required bolus of amio which converted back to SR. Also, noted to have increased drainage from [facility] site but no fever or chills.  Surgery consulted, felt no acute infection with granulation of tissue, no fever/WBC no abx needed but continued to cauterized the wound to help healing.  He received one  day of zosyn and cefazolin which was stopped. On discharge, co-ox improved 64% with CVP 9, home health set up for dobutamine 3 mcg.  Discussed being accountable for appointments, monitor PICC for infection, monitor PERC site for infection (notify provider if fever or worsening pain/drainage develops), and watch for signs of volume overload take additional diuretic.           1. Acute on chronic systolic biventricular HFdue to NICM - Cardiogenic shock resolved.  - Suspect AF cardiomyopathy - ICD placed Nov 2015 - [facility] 10/2018 showed normal cors, elevated filling pressures and low CI at 1.9 - TTE10/2020 LVEF 20%, mild RV dysfunction - Admit 06/2019 with severe HF, required IABP and milrinone. TEE 5/21EF 20-25% with moderate to severe RV dysfunction. Did not follow up outpatient.  - Admitted 12/25/2019 with ADHF. Milrinone 0.25 mcg started 11/7 due to low output HF.  Echo 12/28/19 EF 20-25% RV mildly decreased. Moved to ICU on 11/8 due to persistently low co-ox on milrinone. 12/29/19: Continued w/ persistently low co-ox despite milrinone 0.5 and NE. Transitioned to dobutamine gtt starting at 2.5 mcg then increased to 3 mcg. Required NE again but has been off since 11/11.  - Suspect RV function worse than it looks on echo.  - On dobutamine 3 (failed wean twice).   - Today: CVP 9, Co-ox 64%, Cr 2.34 - Continue with torsemide 60 mg daily. - Continue dobutamine 3 mcg outpatient with home health (PICC in place).  - No beta blockers due to low output HF - No dig or ACE/ARB/ARNI with worsening renal function. - Options remain very limited - poor LVAD candidate with noncompliance and renal failure.  - Will need repeat echocardiogram outpatient in 3 months.   2. Concerning for acute ischemic R MCA -hx of large R MCA with hemorrhagic conversion in Sept 2020 & L MCA s/p thrombectomy M2 occlusion in 2018.  -11/10: developed new L facial droop, LUE weakness and slurred speech - Code Stroke called.  Neuro  recs appreciated, did not tolerate change in ICD settings for MRI/MRA; Repeat CT on 11/12 no acute findings, prior remote R MCA infarct. - A1c 4.6%, LDL 63, HDL 26, Cholesterol 103, Triglycerides 68 - Weakness and facial droop improving.  - Continue atorvastatin at higher dose of 40 mg. - Continue home health services.    3.PersistentAfib with RVR - In 5/21 had refractory AF requiring multiple DC-CVs, high-dose amio and ranexa.  -He reported intermittent compliance with amio and Eliquis. Out of eliquis prior to admission. Transitioned from eliquis to heparin gtt due to possible need for procedures on 11/9 and planned for TEE DC-CV was scheduled 11/10, however canceled due to new neuro symptoms. - Stopped ranexa on 11/9. - DCCV 11/16 converted to SR. - Back in AF on 11/25 transiently. Converted with IV amio 150 x 1 - Continue amio 200 bid.  - Not candidate for AV node ablation CRT currently with possible infection of GB drain tract - Continue with eliquis 5 mg BID (no bleeding)   4. Acute on CKD IIIb - BaselineCr looks to be  around 1.6 to 1.7 - Admission Cr 1.69 and Peak Cr 2.9 - Creatinine 2.34 today.  - Likely ATN/cardiorenal  5. Elevated troponin - 10/2018 cath clean coronaries - elevated trops due to afib with RVR, CHF  6. Chronic cholecystitis with perc drain - Continues with increasing drainage at former drain site. Remains AF with normal WBCs.  - CT 11/25 suggested of tract infection +/- hepatic abscess - Wound cx with few Klebsiella and S aureus  - General surgery has seen, cauterized site, did not think active infection and recommend stay off abx.  Will follow up outpatient.   7. Iron deficiency anemia - Iron sats low and has received feraheme. - Hgb stable   Discharge Weight Range: 164.24 Discharge Vitals: Blood pressure (!) 130/58, pulse 82, temperature 97.8 F (36.6 C), temperature source Oral, resp. rate 20, height 5\' 7"  (1.702 m), weight 74.5 kg, SpO2 97  %.  Labs: Lab Results  Component Value Date   WBC 4.5 01/18/2020   HGB 9.0 (L) 01/18/2020   HCT 28.9 (L) 01/18/2020   MCV 94.1 01/18/2020   PLT 194 01/18/2020    Recent Labs  Lab 01/18/20 0257  NA 138  K 3.7  CL 99  CO2 28  BUN 23  CREATININE 2.34*  CALCIUM 8.8*  GLUCOSE 141*   Lab Results  Component Value Date   CHOL 103 12/26/2019   HDL 26 (L) 12/26/2019   LDLCALC 63 12/26/2019   TRIG 68 12/26/2019   BNP (last 3 results) Recent Labs    06/01/19 1413 06/23/19 1527 12/25/19 1757  BNP 531.4* 613.2* 776.7*    ProBNP (last 3 results) No results for input(s): PROBNP in the last 8760 hours.   Diagnostic Studies/Procedures   No results found.  Discharge Medications   Allergies as of 01/18/2020      Reactions   Benadryl [diphenhydramine] Palpitations      Medication List    STOP taking these medications   digoxin 0.125 MG tablet Commonly known as: LANOXIN   furosemide 40 MG tablet Commonly known as: LASIX   levalbuterol 0.63 MG/3ML nebulizer solution Commonly known as: XOPENEX   metoCLOPramide 5 MG tablet Commonly known as: REGLAN   ondansetron 4 MG tablet Commonly known as: ZOFRAN   ranolazine 500 MG 12 hr tablet Commonly known as: RANEXA   sertraline 25 MG tablet Commonly known as: ZOLOFT   spironolactone 25 MG tablet Commonly known as: ALDACTONE     TAKE these medications   acetaminophen 325 MG tablet Commonly known as: TYLENOL Take 2 tablets (650 mg total) by mouth every 6 (six) hours as needed for mild pain (or Fever >/= 101).   amiodarone 200 MG tablet Commonly known as: PACERONE Take 1 tablet (200 mg total) by mouth 2 (two) times daily. What changed: when to take this   apixaban 5 MG Tabs tablet Commonly known as: ELIQUIS Take 1 tablet (5 mg total) by mouth 2 (two) times daily.   atorvastatin 40 MG tablet Commonly known as: LIPITOR Take 1 tablet (40 mg total) by mouth every evening. What changed:   medication  strength  See the new instructions.   calcium carbonate 500 MG chewable tablet Commonly known as: TUMS - dosed in mg elemental calcium Chew 1 tablet (200 mg of elemental calcium total) by mouth daily as needed for indigestion or heartburn.   diazepam 5 MG tablet Commonly known as: VALIUM Take 2.5-5 mg by mouth 3 (three) times daily as needed for anxiety.  DOBUTamine 4-5 MG/ML-% infusion Commonly known as: DOBUTREX Inject 233.7 mcg/min into the vein continuous.   docusate sodium 100 MG capsule Commonly known as: COLACE Take 1 capsule (100 mg total) by mouth 2 (two) times daily.   gabapentin 300 MG capsule Commonly known as: NEURONTIN Take 2 capsules (600 mg total) by mouth 3 (three) times daily. What changed:   how much to take  when to take this   pantoprazole 40 MG tablet Commonly known as: PROTONIX Take 1 tablet (40 mg total) by mouth daily.   potassium chloride SA 20 MEQ tablet Commonly known as: KLOR-CON Take 1 tablet (20 mEq total) by mouth daily. What changed: See the new instructions.   torsemide 20 MG tablet Commonly known as: DEMADEX Take 3 tablets (60 mg total) by mouth daily. What changed:   how much to take  when to take this            Durable Medical Equipment  (From admission, onward)         Start     Ordered   01/13/20 1531  Heart failure home health orders  (Heart failure home health orders / Face to face)  Once       Comments: Heart Failure Follow-up Care:  Verify follow-up appointments per Patient Discharge Instructions. Confirm transportation arranged. Reconcile home medications with discharge medication list. Remove discontinued medications from use. Assist patient/caregiver to manage medications using pill box. Reinforce low sodium food selection Assessments: Vital signs and oxygen saturation at each visit. Assess home environment for safety concerns, caregiver support and availability of low-sodium foods. Consult Research officer, political party, PT/OT, Dietitian, and CNA based on assessments. Perform comprehensive cardiopulmonary assessment. Notify MD for any change in condition or weight gain of 3 pounds in one day or 5 pounds in one week with symptoms. Daily Weights and Symptom Monitoring: Ensure patient has access to scales. Teach patient/caregiver to weigh daily before breakfast and after voiding using same scale and record.    Teach patient/caregiver to track weight and symptoms and when to notify Provider. Activity: Develop individualized activity plan with patient/caregiver.   Question Answer Comment  Heart Failure Follow-up Care Advanced Heart Failure (AHF) Clinic at [phone]   Obtain the following labs Basic Metabolic Panel   Lab frequency Weekly   Fax lab results to AHF Clinic at [phone]   Diet Low Sodium Heart Healthy   Fluid restrictions: 2000 mL Fluid      01/13/20 1533          Disposition   The patient will be discharged in stable condition to home.   Follow-up Information    Bensimhon, Shaune Pascal, MD Follow up on 01/25/2020.   Specialty: Cardiology Why: 3:20 PM  [facility] at [place] Contact information: [address] [place] [address] [phone]        Surgery, [place]. Go on 01/28/2020.   Specialty: General Surgery Why: Follow up to check prior cholecystostomy site at 3:00 PM. Please arrive 30 min prior to appointment time. Bring photo ID and insurance information.  Contact information: [address] [place] [address] [phone]                 Duration of Discharge Encounter: Greater than 35 minutes   Signed, Carlene Coria  01/18/2020, 10:21 AM

## 2020-01-18 NOTE — Progress Notes (Signed)
Occupational Therapy Treatment Patient Details Name: Glenn [name] MRN: [id number] DOB: [birth year] Today's Date: 01/18/2020    History of present illness Glenn [name] is a 69 y.o. male with a hx of chronic systolic HF 2/2 NICM s/p BSci ICD, atrial fibrillation, h/o stroke (L MCA 2018 and R MCA 2020), h/o DVT, chornic a/c therapy, HTN, HLD, pulmonary nodule, Hepatitis C, carpal tunnel,  tobacco abuse (smokes ~1/4 ppd) and chronic cholecystitis with a chronic perc drain.  Pt presented with dizziness and shortness of breath and was admitted to hospital with Afib RVR and CHF.  On 12/30/19 pt with new L facial droop and L weakness - CT was negative for acute changes and pt unable to have MRI.   OT comments  Pt completed ADL at sink in sitting and standing with supervision to min assist. Ambulated in unit with supervision and RW. VSS throughout session.  Pt is eager to go home today.   Follow Up Recommendations  Home health OT;Supervision - Intermittent    Equipment Recommendations  None recommended by OT    Recommendations for Other Services      Precautions / Restrictions Precautions Precautions: Fall Restrictions Weight Bearing Restrictions: No       Mobility Bed Mobility Overal bed mobility: Modified Independent                Transfers Overall transfer level: Modified independent Equipment used: Rolling walker (2 wheeled)                  Balance Overall balance assessment: Needs assistance   Sitting balance-Leahy Scale: Good       Standing balance-Leahy Scale: Fair                             ADL either performed or assessed with clinical judgement   ADL Overall ADL's : Needs assistance/impaired     Grooming: Oral care;Standing;Supervision/safety;Wash/dry hands;Wash/dry face;Applying deodorant;Sitting;Set up (shaved head)   Upper Body Bathing: Minimal assistance;Sitting Upper Body Bathing Details (indicate cue type and  reason): assisted to wash back Lower Body Bathing: Supervison/ safety;Sit to/from stand   Upper Body Dressing : Set up;Sitting   Lower Body Dressing: Set up;Sitting/lateral leans Lower Body Dressing Details (indicate cue type and reason): slip on shoes             Functional mobility during ADLs: Supervision/safety;Rolling walker (in hall)       Vision       Perception     Praxis      Cognition Arousal/Alertness: Awake/alert Behavior During Therapy: WFL for tasks assessed/performed Overall Cognitive Status: Within Functional Limits for tasks assessed                                          Exercises     Shoulder Instructions       General Comments      Pertinent Vitals/ Pain       Pain Assessment: No/denies pain  Home Living                                          Prior Functioning/Environment              Frequency  Min 2X/week  Progress Toward Goals  OT Goals(current goals can now be found in the care plan section)  Progress towards OT goals: Progressing toward goals  Acute Rehab OT Goals Patient Stated Goal: return home OT Goal Formulation: With patient Time For Goal Achievement: 01/22/20 Potential to Achieve Goals: Good  Plan Discharge plan remains appropriate    Co-evaluation                 AM-PAC OT "6 Clicks" Daily Activity     Outcome Measure   Help from another person eating meals?: None Help from another person taking care of personal grooming?: A Little Help from another person toileting, which includes using toliet, bedpan, or urinal?: A Little Help from another person bathing (including washing, rinsing, drying)?: A Little Help from another person to put on and taking off regular upper body clothing?: A Little Help from another person to put on and taking off regular lower body clothing?: A Little 6 Click Score: 19    End of Session Equipment Utilized During Treatment:  Rolling walker;Gait belt  OT Visit Diagnosis: Other abnormalities of gait and mobility (R26.89);Unsteadiness on feet (R26.81);Muscle weakness (generalized) (M62.81);Hemiplegia and hemiparesis Hemiplegia - Right/Left: Left Hemiplegia - dominant/non-dominant: Non-Dominant Hemiplegia - caused by: Cerebral infarction   Activity Tolerance Patient tolerated treatment well   Patient Left in chair;with call bell/phone within reach   Nurse Communication          Time: 0071-2197 OT Time Calculation (min): 40 min  Charges: OT General Charges $OT Visit: 1 Visit OT Treatments $Self Care/Home Management : 38-52 mins  Nestor Lewandowsky, OTR/L Acute Rehabilitation Services Pager: [phone] Office: [phone]  Malka So 01/18/2020, 9:42 AM

## 2020-01-21 ENCOUNTER — Ambulatory Visit: Payer: Medicare HMO

## 2020-01-21 NOTE — Chronic Care Management (AMB) (Signed)
  Chronic Care Management   Note  01/21/2020 Name: Glenn [name] MRN: [id number] DOB: 07/24/1950  Patient Care Plan: RN Case Manager  Problem Identified: (Stroke)   Priority: Medium  Onset Date: 01/21/2020  Note:   EMMI- Red on EMMI Alert: Day #1 Date:01/20/20 Red Alert Reason: Scheduled a follow-up appointment? No  Filled new prescriptions? No  Been able to take every dose of meds? No   2 Outreach attempt were made to the patient with no success a message was left.  An attempt was made to the son Glenn [name] who is on the release of information.  He was able to provide HIPAA. The purpose of the call was explained.   Clinical Interventions . Evaluation of current treatment plan related to discharge summary of IP event 12/25/19-01/18/20 and patient's adherence to plan as established by provider. . Reviewed medications with son- son states that he went to the pharmacy yesterday and picked up the medication for his father and he is taking his medications. . Discussed plans with patient for ongoing care management follow up and provided patient with direct contact information for care management team . Reviewed scheduled/upcoming provider appointments including: son is aware of the appointment with Dr Haroldine Laws on 01/25/20 at [address] and he will be taking his father. Son was given number to Cardiology Office if he has any questions. [phone]. Patient Needs an appointment with  [facility] Surgery  in 2 week With [place] [address] [phone]. Son was at work and could not talk any longer. . The son also states that Home health has started in the home [facility] is in the home now [phone] and will be there thru the 5th doing Iv antibiotics and dobutamine and then [facility] should be coming in the home on the 6th per the note on 11/29 by Glenn Bamberger RN in the chart. . caregiver support provided . family  involvement promoted . caregiver stress acknowledged . healthy lifestyle promoted . positive reinforcement provided . self or caregiver awareness of signs/symptoms of repeat stroke promoted- and he understands to call 911 . knowledge of how to summon emergency services ensured-    Explained if Glenn [name] receives an automated call again and the response to any of the questions are abnormal Glenn [name] may get another call. Glenn [name]  states that the call was appreciated.   Follow up Plan: RNCM will send note to [facility] RN and Glenn Churn RN to follow up.  Glenn Arms RN, BSN, [facility] Care Management Coordinator [place] Phone: [phone] I Fax: [phone]

## 2020-01-22 ENCOUNTER — Other Ambulatory Visit: Payer: Self-pay | Admitting: *Deleted

## 2020-01-22 NOTE — Patient Outreach (Signed)
[facility]) Care Management  01/22/2020  Glenn [name] February 05, 1951 [id number]   EMMI red alert stroke Day#1 Date: 01/20/20  Red Alert reason : Scheduled Follow up appointment ? No, Filled new prescriptions? No, Been able to take each dose? No  Received email and inbasket message regarding EMMI red flag alert.  Patient is followed in Chronic Care Management program at [place] by embedded [facility] Care Manager  [name] RN .  Noted patient has been outreached regarding EMMI red alert flag.    Plan Patient follow up will be managed by  embedded RN Care Management Coordinator.  In basket communication to [name], Therapist, sports .   Joylene Draft, RN, BSN  [place] Management Coordinator  [phone]- Mobile [phone]- Toll Free Main Office

## 2020-01-25 ENCOUNTER — Encounter (HOSPITAL_COMMUNITY): Payer: Medicare HMO | Admitting: Internal Medicine

## 2020-01-26 ENCOUNTER — Ambulatory Visit: Payer: Medicaid Other | Admitting: *Deleted

## 2020-01-26 DIAGNOSIS — I63511 Cerebral infarction due to unspecified occlusion or stenosis of right middle cerebral artery: Secondary | ICD-10-CM

## 2020-01-26 NOTE — Chronic Care Management (AMB) (Addendum)
°  Chronic Care Management   Follow Up Note Red EMMI Alert (Stroke)   01/26/2020 Name: Glenn [name] MRN: [id number] DOB: 1950/08/25  Referred by: Gerlene Fee, DO Reason for referral : Chronic Care Management (RN f/u stroke)     Patient Care Plan: RN Case Manager    Problem Identified: (Stroke)   Priority: Medium  Onset Date: 01/21/2020  Note:   EMMI- Red on EMMI Alert: Day #1 Date:01/20/20 Red Alert Reason: Scheduled a follow-up appointment? No  Filled new prescriptions? No  Been able to take every dose of meds? No   2nd telephone follow-up on Red EMMI. Unsuccessful outreach attempt to patient. Left HIPAA compliant voicemail. Outreach to son and emergency contact, [name], was successful.   Clinical Interventions  Evaluation of current treatment plan related to discharge summary of IP event 12/25/19-01/18/20 and patient's adherence to plan as established by provider.  [name] verified that his father has his discharge medications and is taking them as prescribed  Discussed plans with patient for ongoing care management follow up and provided patient with direct contact information for care management team  Reviewed scheduled/upcoming provider appointments including: Cardiology appointment on 01/25/20 was cancelled and rescheduled for 03/09/20. Son is aware of the appointment and his father will have transportation to that office visit. Per son, appt with [facility] has been scheduled for 02/22/20. He also has transportation to that appointment. Son aware of importance of keeping those appointments.  Verified that home health is still coming as ordered. [facility] is in the home now [phone] and will be there thru the 5th doing Iv antibiotics and dobutamine and then [facility] should be coming in the home on the 6th per the note on 11/29 by [name] RN in the chart.  Verified that there were no other nursing or medical concerns and that  patient has everything that he needs to manage his condition at the present time  caregiver support provided  family involvement promoted  caregiver stress acknowledged  healthy lifestyle promoted  positive reinforcement provided  Encouraged to keep all medical appointments  Encouraged to reach out to CCM Team, PCP, or specialty providers as needed  self or caregiver awareness of signs/symptoms of repeat stroke promoted- and he understands to call 911  knowledge of how to summon emergency services ensured-   Follow up Plan: RNCM will send note to [name], RN who is the embedded CCM for [facility] and to [name], RN who will be covering for Traci 12/8-12/10/21.   [name], BSN, RN-BC Embedded Chronic Care Manager [facility] Direct Dial: [phone]

## 2020-01-26 NOTE — Patient Instructions (Signed)
Patient Instructions: Keep cardiology appt on 03/09/20 Keep surgical appt on 02/22/20 Seek emergency medical services if needed Call PCP or CCM Team with any questions or concerns  [name], BSN, RN-BC [place] [facility] [place] [facility] Direct Dial: [phone]

## 2020-01-27 ENCOUNTER — Other Ambulatory Visit (HOSPITAL_COMMUNITY): Payer: Self-pay | Admitting: Internal Medicine

## 2020-02-01 ENCOUNTER — Other Ambulatory Visit (HOSPITAL_COMMUNITY): Payer: Self-pay | Admitting: Internal Medicine

## 2020-02-01 ENCOUNTER — Other Ambulatory Visit: Payer: Self-pay | Admitting: *Deleted

## 2020-02-01 NOTE — Patient Outreach (Signed)
Glenn [name] [facility]) Care Management  02/01/2020  [name] 05-18-1950 [id number]   EMMI Red Flag  Day:9 Date: 01/28/20 Red Alert Reason :  Lost interest in things they used to enjoy? Yes  Scheduled a follow-up appointment?    Sad, hopeless, anxious, or empty? 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  Placed call to [name], designated party release , patient son , HIPAA verified it states he is with patient and placed him on the phone.  Explained to patient reason for the call, and the automated calls received since discharge. Addressed EMMI Red alert reasons: Lost interest in things he used to enjoy, patient states not really to this question.  Sad , hopeless, anxious or empty? He discussed that feels this way sometimes, denies having feeling of sadness, hopeless at this time. Discussed with patient what helps him when it he has  These feelings he states prayer and mediation. Again he denies having feelings of sadness , hopeless being anxious and empty at this time. Discussed ongoing follow up support/counseling regarding concerns he  declines . Depression screen PHQ 2/9 02/01/2020  Decreased Interest 0  Down, Depressed, Hopeless 1  PHQ - 2 Score 1  Altered sleeping -  Tired, decreased energy -  Change in appetite -  Feeling bad or failure about yourself  -  Trouble concentrating -  Moving slowly or fidgety/restless -  Suicidal thoughts -  PHQ-9 Score -  Difficult doing work/chores -  Some recent data might be hidden   Patient discussed that he is slowly getting stronger, using walker and participating in home therapy. He denies any other concerns at this  time.  Explained to patient that he may received additional EMMI automated calls and depending on responses to questions he may receive return call from RN care Coordinator, he voiced understanding.   Plan Will close to [facility] care management at this time , will send [name], Embedded Care manager this visit note for ongoing chronic care managment follow up.   Joylene Draft, RN, BSN  [facility] Management Coordinator  [phone]- Mobile [phone]- Toll Free Main Office

## 2020-02-02 ENCOUNTER — Inpatient Hospital Stay (HOSPITAL_COMMUNITY)
Admission: EM | Admit: 2020-02-02 | Discharge: 2020-02-20 | DRG: 291 | Disposition: E | Payer: Medicare HMO | Attending: Student | Admitting: Student

## 2020-02-02 ENCOUNTER — Telehealth (HOSPITAL_COMMUNITY): Payer: Self-pay | Admitting: *Deleted

## 2020-02-02 ENCOUNTER — Other Ambulatory Visit: Payer: Self-pay | Admitting: *Deleted

## 2020-02-02 ENCOUNTER — Emergency Department (HOSPITAL_COMMUNITY): Payer: Medicare HMO

## 2020-02-02 DIAGNOSIS — R4781 Slurred speech: Secondary | ICD-10-CM | POA: Diagnosis present

## 2020-02-02 DIAGNOSIS — Z7901 Long term (current) use of anticoagulants: Secondary | ICD-10-CM

## 2020-02-02 DIAGNOSIS — I5043 Acute on chronic combined systolic (congestive) and diastolic (congestive) heart failure: Secondary | ICD-10-CM | POA: Diagnosis present

## 2020-02-02 DIAGNOSIS — R29810 Facial weakness: Secondary | ICD-10-CM | POA: Diagnosis present

## 2020-02-02 DIAGNOSIS — I4891 Unspecified atrial fibrillation: Secondary | ICD-10-CM | POA: Diagnosis not present

## 2020-02-02 DIAGNOSIS — Z66 Do not resuscitate: Secondary | ICD-10-CM | POA: Diagnosis not present

## 2020-02-02 DIAGNOSIS — Z20822 Contact with and (suspected) exposure to covid-19: Secondary | ICD-10-CM | POA: Diagnosis present

## 2020-02-02 DIAGNOSIS — N184 Chronic kidney disease, stage 4 (severe): Secondary | ICD-10-CM | POA: Diagnosis present

## 2020-02-02 DIAGNOSIS — I69359 Hemiplegia and hemiparesis following cerebral infarction affecting unspecified side: Secondary | ICD-10-CM

## 2020-02-02 DIAGNOSIS — Z888 Allergy status to other drugs, medicaments and biological substances status: Secondary | ICD-10-CM

## 2020-02-02 DIAGNOSIS — I5084 End stage heart failure: Secondary | ICD-10-CM | POA: Diagnosis present

## 2020-02-02 DIAGNOSIS — Z86718 Personal history of other venous thrombosis and embolism: Secondary | ICD-10-CM

## 2020-02-02 DIAGNOSIS — Z8249 Family history of ischemic heart disease and other diseases of the circulatory system: Secondary | ICD-10-CM

## 2020-02-02 DIAGNOSIS — Z95810 Presence of automatic (implantable) cardiac defibrillator: Secondary | ICD-10-CM

## 2020-02-02 DIAGNOSIS — Z823 Family history of stroke: Secondary | ICD-10-CM

## 2020-02-02 DIAGNOSIS — I4819 Other persistent atrial fibrillation: Secondary | ICD-10-CM | POA: Diagnosis present

## 2020-02-02 DIAGNOSIS — B1920 Unspecified viral hepatitis C without hepatic coma: Secondary | ICD-10-CM | POA: Diagnosis present

## 2020-02-02 DIAGNOSIS — R0602 Shortness of breath: Secondary | ICD-10-CM | POA: Diagnosis present

## 2020-02-02 DIAGNOSIS — E1122 Type 2 diabetes mellitus with diabetic chronic kidney disease: Secondary | ICD-10-CM | POA: Diagnosis present

## 2020-02-02 DIAGNOSIS — Z79899 Other long term (current) drug therapy: Secondary | ICD-10-CM | POA: Diagnosis not present

## 2020-02-02 DIAGNOSIS — Z9119 Patient's noncompliance with other medical treatment and regimen: Secondary | ICD-10-CM | POA: Diagnosis not present

## 2020-02-02 DIAGNOSIS — I5082 Biventricular heart failure: Secondary | ICD-10-CM | POA: Diagnosis present

## 2020-02-02 DIAGNOSIS — Z87891 Personal history of nicotine dependence: Secondary | ICD-10-CM

## 2020-02-02 DIAGNOSIS — R570 Cardiogenic shock: Secondary | ICD-10-CM | POA: Diagnosis present

## 2020-02-02 DIAGNOSIS — I130 Hypertensive heart and chronic kidney disease with heart failure and stage 1 through stage 4 chronic kidney disease, or unspecified chronic kidney disease: Principal | ICD-10-CM | POA: Diagnosis present

## 2020-02-02 DIAGNOSIS — D509 Iron deficiency anemia, unspecified: Secondary | ICD-10-CM | POA: Diagnosis present

## 2020-02-02 DIAGNOSIS — N179 Acute kidney failure, unspecified: Secondary | ICD-10-CM | POA: Diagnosis present

## 2020-02-02 DIAGNOSIS — Z515 Encounter for palliative care: Secondary | ICD-10-CM | POA: Diagnosis not present

## 2020-02-02 DIAGNOSIS — I509 Heart failure, unspecified: Secondary | ICD-10-CM

## 2020-02-02 DIAGNOSIS — K811 Chronic cholecystitis: Secondary | ICD-10-CM | POA: Diagnosis present

## 2020-02-02 DIAGNOSIS — I420 Dilated cardiomyopathy: Secondary | ICD-10-CM | POA: Diagnosis present

## 2020-02-02 DIAGNOSIS — E871 Hypo-osmolality and hyponatremia: Secondary | ICD-10-CM | POA: Diagnosis present

## 2020-02-02 DIAGNOSIS — K750 Abscess of liver: Secondary | ICD-10-CM | POA: Diagnosis present

## 2020-02-02 DIAGNOSIS — Z7189 Other specified counseling: Secondary | ICD-10-CM | POA: Diagnosis not present

## 2020-02-02 DIAGNOSIS — E1165 Type 2 diabetes mellitus with hyperglycemia: Secondary | ICD-10-CM | POA: Diagnosis present

## 2020-02-02 DIAGNOSIS — N183 Chronic kidney disease, stage 3 unspecified: Secondary | ICD-10-CM | POA: Diagnosis present

## 2020-02-02 LAB — CBC WITH DIFFERENTIAL/PLATELET
Abs Immature Granulocytes: 0.01 10*3/uL (ref 0.00–0.07)
Basophils Absolute: 0 10*3/uL (ref 0.0–0.1)
Basophils Relative: 0 %
Eosinophils Absolute: 0.3 10*3/uL (ref 0.0–0.5)
Eosinophils Relative: 9 %
HCT: 30.7 % — ABNORMAL LOW (ref 39.0–52.0)
Hemoglobin: 9.4 g/dL — ABNORMAL LOW (ref 13.0–17.0)
Immature Granulocytes: 0 %
Lymphocytes Relative: 26 %
Lymphs Abs: 1 10*3/uL (ref 0.7–4.0)
MCH: 29 pg (ref 26.0–34.0)
MCHC: 30.6 g/dL (ref 30.0–36.0)
MCV: 94.8 fL (ref 80.0–100.0)
Monocytes Absolute: 0.4 10*3/uL (ref 0.1–1.0)
Monocytes Relative: 9 %
Neutro Abs: 2.2 10*3/uL (ref 1.7–7.7)
Neutrophils Relative %: 56 %
Platelets: 203 10*3/uL (ref 150–400)
RBC: 3.24 MIL/uL — ABNORMAL LOW (ref 4.22–5.81)
RDW: 16.4 % — ABNORMAL HIGH (ref 11.5–15.5)
WBC: 3.9 10*3/uL — ABNORMAL LOW (ref 4.0–10.5)
nRBC: 0 % (ref 0.0–0.2)

## 2020-02-02 LAB — HEPATIC FUNCTION PANEL
ALT: 11 U/L (ref 0–44)
AST: 15 U/L (ref 15–41)
Albumin: 3.9 g/dL (ref 3.5–5.0)
Alkaline Phosphatase: 85 U/L (ref 38–126)
Bilirubin, Direct: <0.1 mg/dL (ref 0.0–0.2)
Total Bilirubin: 0.8 mg/dL (ref 0.3–1.2)
Total Protein: 7.7 g/dL (ref 6.5–8.1)

## 2020-02-02 LAB — BASIC METABOLIC PANEL
Anion gap: 10 (ref 5–15)
BUN: 18 mg/dL (ref 8–23)
CO2: 25 mmol/L (ref 22–32)
Calcium: 9.3 mg/dL (ref 8.9–10.3)
Chloride: 104 mmol/L (ref 98–111)
Creatinine, Ser: 2.53 mg/dL — ABNORMAL HIGH (ref 0.61–1.24)
GFR, Estimated: 27 mL/min — ABNORMAL LOW (ref >60)
Glucose, Bld: 106 mg/dL — ABNORMAL HIGH (ref 70–99)
Potassium: 3.9 mmol/L (ref 3.5–5.1)
Sodium: 139 mmol/L (ref 135–145)

## 2020-02-02 LAB — RESP PANEL BY RT-PCR (FLU A&B, COVID) ARPGX2
Influenza A by PCR: NEGATIVE
Influenza B by PCR: NEGATIVE
SARS Coronavirus 2 by RT PCR: NEGATIVE

## 2020-02-02 LAB — MAGNESIUM: Magnesium: 2 mg/dL (ref 1.7–2.4)

## 2020-02-02 LAB — TROPONIN I (HIGH SENSITIVITY): Troponin I (High Sensitivity): 65 ng/L — ABNORMAL HIGH (ref <18)

## 2020-02-02 LAB — PROTIME-INR
INR: 1.8 — ABNORMAL HIGH (ref 0.8–1.2)
Prothrombin Time: 20.1 seconds — ABNORMAL HIGH (ref 11.4–15.2)

## 2020-02-02 LAB — BRAIN NATRIURETIC PEPTIDE: B Natriuretic Peptide: 1014.9 pg/mL — ABNORMAL HIGH (ref 0.0–100.0)

## 2020-02-02 MED ORDER — ATORVASTATIN CALCIUM 40 MG PO TABS
40.0000 mg | ORAL_TABLET | Freq: Every evening | ORAL | Status: DC
  Administered 2020-02-02 – 2020-02-13 (×12): 40 mg via ORAL
  Filled 2020-02-02: qty 4
  Filled 2020-02-02: qty 1
  Filled 2020-02-02: qty 4
  Filled 2020-02-02 (×10): qty 1

## 2020-02-02 MED ORDER — POTASSIUM CHLORIDE CRYS ER 20 MEQ PO TBCR
20.0000 meq | EXTENDED_RELEASE_TABLET | Freq: Every day | ORAL | Status: DC
  Filled 2020-02-02: qty 1

## 2020-02-02 MED ORDER — ONDANSETRON HCL 4 MG/2ML IJ SOLN
4.0000 mg | Freq: Four times a day (QID) | INTRAMUSCULAR | Status: DC | PRN
  Administered 2020-02-03 – 2020-02-14 (×6): 4 mg via INTRAVENOUS
  Filled 2020-02-02 (×6): qty 2

## 2020-02-02 MED ORDER — ACETAMINOPHEN 500 MG PO TABS
500.0000 mg | ORAL_TABLET | Freq: Once | ORAL | Status: AC
  Administered 2020-02-02: 19:00:00 500 mg via ORAL
  Filled 2020-02-02: qty 1

## 2020-02-02 MED ORDER — GABAPENTIN 300 MG PO CAPS
300.0000 mg | ORAL_CAPSULE | Freq: Two times a day (BID) | ORAL | Status: DC
  Administered 2020-02-02 – 2020-02-14 (×24): 300 mg via ORAL
  Filled 2020-02-02 (×25): qty 1

## 2020-02-02 MED ORDER — NITROGLYCERIN 0.4 MG SL SUBL: 0.4000 mg | SUBLINGUAL_TABLET | SUBLINGUAL | Status: DC | PRN

## 2020-02-02 MED ORDER — AMIODARONE HCL 200 MG PO TABS: 200.0000 mg | ORAL_TABLET | Freq: Two times a day (BID) | ORAL | Status: DC

## 2020-02-02 MED ORDER — APIXABAN 5 MG PO TABS
5.0000 mg | ORAL_TABLET | Freq: Two times a day (BID) | ORAL | Status: DC
  Administered 2020-02-02 – 2020-02-14 (×24): 5 mg via ORAL
  Filled 2020-02-02 (×24): qty 1

## 2020-02-02 MED ORDER — PANTOPRAZOLE SODIUM 40 MG PO TBEC
40.0000 mg | DELAYED_RELEASE_TABLET | Freq: Every day | ORAL | Status: DC
  Administered 2020-02-03 – 2020-02-14 (×12): 40 mg via ORAL
  Filled 2020-02-02 (×13): qty 1

## 2020-02-02 MED ORDER — FUROSEMIDE 10 MG/ML IJ SOLN
80.0000 mg | Freq: Two times a day (BID) | INTRAMUSCULAR | Status: DC
  Administered 2020-02-02 – 2020-02-03 (×2): 80 mg via INTRAVENOUS
  Filled 2020-02-02 (×2): qty 8

## 2020-02-02 MED ORDER — ZOLPIDEM TARTRATE 5 MG PO TABS
5.0000 mg | ORAL_TABLET | Freq: Every evening | ORAL | Status: DC | PRN
  Administered 2020-02-06 – 2020-02-12 (×4): 5 mg via ORAL
  Filled 2020-02-02 (×4): qty 1

## 2020-02-02 MED ORDER — ALPRAZOLAM 0.25 MG PO TABS
0.2500 mg | ORAL_TABLET | Freq: Two times a day (BID) | ORAL | Status: DC | PRN
  Administered 2020-02-03 – 2020-02-12 (×12): 0.25 mg via ORAL
  Filled 2020-02-02 (×12): qty 1

## 2020-02-02 MED ORDER — AMIODARONE HCL IN DEXTROSE 360-4.14 MG/200ML-% IV SOLN
30.0000 mg/h | INTRAVENOUS | Status: DC
  Administered 2020-02-03 – 2020-02-14 (×23): 30 mg/h via INTRAVENOUS
  Filled 2020-02-02 (×24): qty 200

## 2020-02-02 MED ORDER — AMIODARONE IV BOLUS ONLY 150 MG/100ML
150.0000 mg | Freq: Once | INTRAVENOUS | Status: AC
  Administered 2020-02-02: 23:00:00 150 mg via INTRAVENOUS
  Filled 2020-02-02: qty 100

## 2020-02-02 MED ORDER — CALCIUM CARBONATE ANTACID 500 MG PO CHEW: 1.0000 | CHEWABLE_TABLET | Freq: Every day | ORAL | Status: DC | PRN

## 2020-02-02 MED ORDER — AMIODARONE HCL IN DEXTROSE 360-4.14 MG/200ML-% IV SOLN
60.0000 mg/h | INTRAVENOUS | Status: AC
  Administered 2020-02-02 – 2020-02-03 (×2): 60 mg/h via INTRAVENOUS
  Filled 2020-02-02 (×2): qty 200

## 2020-02-02 MED ORDER — AMIODARONE LOAD VIA INFUSION
150.0000 mg | Freq: Once | INTRAVENOUS | Status: DC
  Filled 2020-02-02: qty 83.34

## 2020-02-02 MED ORDER — ACETAMINOPHEN 325 MG PO TABS
650.0000 mg | ORAL_TABLET | ORAL | Status: DC | PRN
  Administered 2020-02-03 – 2020-02-07 (×4): 650 mg via ORAL
  Filled 2020-02-02 (×4): qty 2

## 2020-02-02 MED ORDER — DOBUTAMINE IN D5W 4-5 MG/ML-% IV SOLN
5.0000 ug/kg/min | INTRAVENOUS | Status: DC
  Administered 2020-02-02: 3 ug/kg/min via INTRAVENOUS
  Administered 2020-02-05 – 2020-02-14 (×6): 5 ug/kg/min via INTRAVENOUS
  Filled 2020-02-02 (×7): qty 250

## 2020-02-02 NOTE — ED Notes (Signed)
Paged cardiology for nurse.

## 2020-02-02 NOTE — ED Notes (Signed)
Spoke with Financial controller and requested admitting provider be paged due to no admission orders entered at this time.

## 2020-02-02 NOTE — ED Provider Notes (Signed)
[facility] EMERGENCY DEPARTMENT Provider Note   CSN: [id number] Arrival date & time: 02/19/2020  1622     History Chief Complaint  Patient presents with  . Shortness of Breath    Glenn [name] is a 69 y.o. male.     Per cards note: chronic systolic HF (EF 48-18%) 2/2 NICM s/pBSciICD (Required in May 2021 IABP, milrinone for cardiogenic shock and multiple DC-CVs, amiodarone and ranexa for afib but never followed up outpatient with heart failure);persistentatrial fibrillation, h/o stroke (Lg L MCA infarct w/ hemorrhagic conversionin 2018),HTN, HCV,tobacco abuse (smokes ~1/4 ppd) and chronic cholecystitiswith achronic perc drain (fell out 11/2019, did not follow up with surgery outpatient)   Per dc note on 11/29 - dc on torsemide and dobutamine; DC cardioversion on 11/16 with 200J x 1 with successful conversion.  Patient reports since last night he has had worsening shortness of breath.  Breathing gets worse with any sort of exertion, worse with lying flat.  Last night had to use 2 pillows when normally he sleeps on his back.  Very slight amount of chest tightness, no pain at present.  Also states that he has had some intermittent abdominal pain over the last few days, worse on his left side.   HPI     Past Medical History:  Diagnosis Date  . Acute blood loss anemia   . Acute CVA (cerebrovascular accident) ([place])   . Acute deep vein thrombosis (DVT) of both lower extremities ([name])   . Acute kidney injury ([place])   . Acute on chronic combined systolic and diastolic CHF (congestive heart failure) ([place])   . Acute renal failure superimposed on stage 3a chronic kidney disease ([place])   . Acute respiratory failure with hypoxia ([place])   . Atrial fibrillation ([place])   . Carpal tunnel syndrome of right wrist 02/28/2018  . Cerebral edema ([place]) 11/13/2018  . Cerebral infarction ([place])   . CHF (congestive heart failure) ([place])   . Cholecystitis 02/04/2019  . Chronic  right hip pain   . DCM (dilated cardiomyopathy) ([place])   . Dysphagia, post-stroke   . Elevated troponin   . Entrapment of right ulnar nerve 02/28/2018  . Headache due to intracranial disease 11/14/2018  . Hepatitis C   . History of hemorrhagic stroke with residual hemiparesis ([place]) 02/04/2019  . HTN (hypertension) 08/14/2016  . Hyperlipidemia LDL goal <70 11/13/2018  . Hypertension   . ICD (implantable cardioverter-defibrillator) in place 09/13/2016  . Impotence due to erectile dysfunction 09/30/2017  . Ischemic cardiomyopathy   . Labile blood glucose   . Left leg DVT ([place]) 02/04/2019  . Marijuana user 11/13/2018  . Paroxysmal atrial fibrillation ([name])   . Right middle cerebral artery stroke ([place]) 11/13/2018  . Solitary pulmonary nodule 06/10/2017   5 mm RUL nodule noted incidentally as part of CVA workup 08/2016. With smoking history would obtain low-dose CT scan 08/2017.   . Stroke (cerebrum) ([name]) Lg L MCA infarct w/ hemorrhagic conversion, embolic d/t AF 5/63/1497  . Stroke ([place])   . Trochanteric bursitis, right hip 11/14/2018  . Visit for monitoring Tikosyn therapy 03/26/2017    Patient Active Problem List   Diagnosis Date Noted  . Atrial fibrillation with rapid ventricular response ([place]) 01/25/2020  . Anxiety 09/02/2019  . Malnutrition of moderate degree 07/14/2019  . Palliative care by specialist   . Atrial fibrillation with RVR ([place]) 06/23/2019  . Chronic systolic heart failure ([place]) 06/21/2019  . Persistent atrial fibrillation with rapid ventricular response ([place])   .  Thrombus of left atrial appendage   . Left leg pain 03/27/2019  . History of hemorrhagic stroke with residual hemiparesis ([place]) 02/04/2019  . Left leg DVT ([place]) 02/04/2019  . Chronic cholecystitis 02/04/2019  . Acute deep vein thrombosis (DVT) of both lower extremities ([name])   . Acute blood loss anemia   . Labile blood glucose   . CKD (chronic kidney disease) stage 3, GFR 30-59 ml/min ([name])   . Chronic right hip  pain   . Headache due to intracranial disease 11/14/2018  . Trochanteric bursitis, right hip 11/14/2018  . Cerebral edema ([place]) 11/13/2018  . Hyperlipidemia LDL goal <70 11/13/2018  . Marijuana user 11/13/2018  . Right middle cerebral artery stroke ([place]) 11/13/2018  . Acute CVA (cerebrovascular accident) ([place])   . Noncompliance   . Dysphagia, post-stroke   . Acute on chronic combined systolic and diastolic CHF (congestive heart failure) ([place])   . DCM (dilated cardiomyopathy) ([place])   . Atrial fibrillation ([place]) 11/04/2018  . Elevated troponin   . Acute respiratory failure with hypoxia ([place])   . Entrapment of right ulnar nerve 02/28/2018  . Carpal tunnel syndrome of right wrist 02/28/2018  . Impotence due to erectile dysfunction 09/30/2017  . Solitary pulmonary nodule 06/10/2017  . Neck pain 04/06/2017  . Paroxysmal atrial fibrillation ([name])   . DNR (do not resuscitate) discussion 03/25/2017  . ICD (implantable cardioverter-defibrillator) in place 09/13/2016  . Chest pain 09/13/2016  . Tobacco abuse 09/13/2016  . Upper back pain 09/13/2016  . Housing problems 09/13/2016  . Ischemic cardiomyopathy   . Cerebral infarction ([place])   . Stroke (cerebrum) ([name]) Lg L MCA infarct w/ hemorrhagic conversion, embolic d/t AF 93/71/6967  . HTN (hypertension) 08/14/2016  . Chronic Hepatitis C  08/14/2016    Past Surgical History:  Procedure Laterality Date  . CARDIAC DEFIBRILLATOR PLACEMENT  2015  . CARDIOVERSION N/A 10/10/2016   Procedure: CARDIOVERSION;  Surgeon: Dorothy Spark, MD;  Location: [facility];  Service: Cardiovascular;  Laterality: N/A;  . CARDIOVERSION N/A 03/27/2017   Procedure: CARDIOVERSION;  Surgeon: Jerline Pain, MD;  Location: [facility];  Service: Cardiovascular;  Laterality: N/A;  . CARDIOVERSION N/A 10/29/2018   Procedure: CARDIOVERSION;  Surgeon: Sanda Klein, MD;  Location: [facility];  Service: Cardiovascular;  Laterality: N/A;  . CARDIOVERSION N/A  11/05/2018   Procedure: CARDIOVERSION;  Surgeon: Acie Fredrickson Wonda Cheng, MD;  Location: [facility];  Service: Cardiovascular;  Laterality: N/A;  . CARDIOVERSION N/A 07/02/2019   Procedure: CARDIOVERSION;  Surgeon: Jolaine Artist, MD;  Location: [facility];  Service: Cardiovascular;  Laterality: N/A;  . CARDIOVERSION N/A 07/14/2019   Procedure: CARDIOVERSION;  Surgeon: Jolaine Artist, MD;  Location: [facility] ENDOSCOPY;  Service: Cardiovascular;  Laterality: N/A;  . CARDIOVERSION N/A 12/30/2019   Procedure: CARDIOVERSION;  Surgeon: Jolaine Artist, MD;  Location: [facility];  Service: Cardiovascular;  Laterality: N/A;  . CARDIOVERSION N/A 01/05/2020   Procedure: BEDSIDE CARDIOVERSION;  Surgeon: Jolaine Artist, MD;  Location: [facility];  Service: Cardiovascular;  Laterality: N/A;  . EYE SURGERY Left 1990  . IABP INSERTION N/A 06/26/2019   Procedure: IABP INSERTION;  Surgeon: Jolaine Artist, MD;  Location: [facility] CV LAB;  Service: Cardiovascular;  Laterality: N/A;  . IR CATHETER TUBE CHANGE  06/24/2019  . IR EXCHANGE BILIARY DRAIN  02/11/2019  . IR EXCHANGE BILIARY DRAIN  06/09/2019  . IR EXCHANGE BILIARY DRAIN  09/23/2019  . IR IVC FILTER PLMT / S&I /IMG GUID/MOD SED  12/04/2018  .  IR PERC CHOLECYSTOSTOMY  12/13/2018  . IR PERCUTANEOUS ART THROMBECTOMY/INFUSION INTRACRANIAL INC DIAG ANGIO  09/05/2016  . IR RADIOLOGIST EVAL & MGMT  10/03/2016  . RADIOLOGY WITH ANESTHESIA N/A 09/05/2016   Procedure: RADIOLOGY WITH ANESTHESIA;  Surgeon: Luanne Bras, MD;  Location: [facility];  Service: Radiology;  Laterality: N/A;  . RIGHT HEART CATH N/A 06/24/2019   Procedure: RIGHT HEART CATH;  Surgeon: Jolaine Artist, MD;  Location: [facility] CV LAB;  Service: Cardiovascular;  Laterality: N/A;  . RIGHT HEART CATH N/A 01/13/2020   Procedure: RIGHT HEART CATH;  Surgeon: Jolaine Artist, MD;  Location: [facility] CV LAB;  Service: Cardiovascular;  Laterality: N/A;  . RIGHT/LEFT HEART CATH AND CORONARY  ANGIOGRAPHY N/A 11/03/2018   Procedure: RIGHT/LEFT HEART CATH AND CORONARY ANGIOGRAPHY;  Surgeon: Lorretta Harp, MD;  Location: [facility] CV LAB;  Service: Cardiovascular;  Laterality: N/A;  . TEE WITHOUT CARDIOVERSION N/A 02/06/2019   Procedure: TRANSESOPHAGEAL ECHOCARDIOGRAM (TEE);  Surgeon: Skeet Latch, MD;  Location: [facility];  Service: Cardiovascular;  Laterality: N/A;  . TEE WITHOUT CARDIOVERSION N/A 06/09/2019   Procedure: TRANSESOPHAGEAL ECHOCARDIOGRAM (TEE);  Surgeon: Buford Dresser, MD;  Location: [facility] ENDOSCOPY;  Service: Cardiovascular;  Laterality: N/A;  . TEE WITHOUT CARDIOVERSION N/A 12/30/2019   Procedure: TRANSESOPHAGEAL ECHOCARDIOGRAM (TEE);  Surgeon: Jolaine Artist, MD;  Location: [facility] OR;  Service: Cardiovascular;  Laterality: N/A;       Family History  Problem Relation Age of Onset  . High blood pressure Mother   . High blood pressure Father   . Stroke Maternal Aunt   . Heart disease Neg Hx     Social History   Tobacco Use  . Smoking status: Former Smoker    Packs/day: 0.50    Types: Cigarettes  . Smokeless tobacco: Never Used  . Tobacco comment: a pack last three days  Vaping Use  . Vaping Use: Never used  Substance Use Topics  . Alcohol use: Not Currently    Alcohol/week: 3.0 standard drinks    Types: 3 Cans of beer per week    Comment: pt stop drinking   . Drug use: Not Currently    Frequency: 2.0 times per week    Types: Marijuana    Comment: stop smoking     Home Medications Prior to Admission medications   Medication Sig Start Date End Date Taking? Authorizing Provider  acetaminophen (TYLENOL) 325 MG tablet Take 2 tablets (650 mg total) by mouth every 6 (six) hours as needed for mild pain (or Fever >/= 101). 07/16/19  Yes Clegg, Amy D, NP  apixaban (ELIQUIS) 5 MG TABS tablet Take 1 tablet (5 mg total) by mouth 2 (two) times daily. 01/18/20  Yes Lyda Jester M, PA-C  atorvastatin (LIPITOR) 40 MG tablet Take 1 tablet  (40 mg total) by mouth every evening. 01/18/20  Yes Lyda Jester M, PA-C  calcium carbonate (TUMS - DOSED IN MG ELEMENTAL CALCIUM) 500 MG chewable tablet Chew 1 tablet (200 mg of elemental calcium total) by mouth daily as needed for indigestion or heartburn. 03/12/19  Yes [name], MD  DOBUTamine (DOBUTREX) 4-5 MG/ML-% infusion Inject 233.7 mcg/min into the vein continuous. 01/18/20  Yes Lyda Jester M, PA-C  furosemide (LASIX) 40 MG tablet Take 40 mg by mouth as needed for fluid.   Yes [provider]  gabapentin (NEURONTIN) 300 MG capsule Take 2 capsules (600 mg total) by mouth 3 (three) times daily. Patient taking differently: Take 300 mg by mouth 2 (  two) times daily. 09/11/19  Yes Autry-Lott, Naaman Plummer, DO  pantoprazole (PROTONIX) 40 MG tablet Take 1 tablet (40 mg total) by mouth daily. 07/16/19  Yes Clegg, Amy D, NP  potassium chloride SA (KLOR-CON) 20 MEQ tablet Take 1 tablet (20 mEq total) by mouth daily. 01/18/20  Yes Rosita Fire, Brittainy M, PA-C  torsemide (DEMADEX) 20 MG tablet Take 3 tablets (60 mg total) by mouth daily. 01/18/20  Yes Lyda Jester M, PA-C  amiodarone (PACERONE) 200 MG tablet Take 1 tablet (200 mg total) by mouth 2 (two) times daily. Patient not taking: Reported on 02/14/2020 01/18/20   Lyda Jester M, PA-C  docusate sodium (COLACE) 100 MG capsule Take 1 capsule (100 mg total) by mouth 2 (two) times daily. Patient not taking: Reported on 01/27/2020 07/16/19   Darrick Grinder D, NP    Allergies    Benadryl [diphenhydramine]  Review of Systems   Review of Systems  Constitutional: Negative for chills and fever.  HENT: Negative for ear pain and sore throat.   Eyes: Negative for pain and visual disturbance.  Respiratory: Positive for shortness of breath. Negative for cough.   Cardiovascular: Negative for chest pain and palpitations.  Gastrointestinal: Negative for abdominal pain and vomiting.  Genitourinary: Negative for dysuria and hematuria.   Musculoskeletal: Negative for arthralgias and back pain.  Skin: Negative for color change and rash.  Neurological: Negative for seizures and syncope.  All other systems reviewed and are negative.   Physical Exam Updated Vital Signs BP (!) 117/95   Pulse (!) 118   Temp 98.3 F (36.8 C) (Oral)   Resp (!) 31   Ht 5\' 7"  (1.702 m)   Wt 79.8 kg Comment: reported  SpO2 99%   BMI 27.57 kg/m   Physical Exam Vitals and nursing note reviewed.  Constitutional:      Appearance: He is well-developed and well-nourished.  HENT:     Head: Normocephalic and atraumatic.  Eyes:     Conjunctiva/sclera: Conjunctivae normal.  Cardiovascular:     Rate and Rhythm: Normal rate and regular rhythm.     Heart sounds: No murmur heard.   Pulmonary:     Effort: No respiratory distress.     Comments: Breath sounds equal bilaterally.  Somewhat diminished at bases. Abdominal:     Palpations: Abdomen is soft.     Tenderness: There is no abdominal tenderness.  Musculoskeletal:        General: No edema.     Cervical back: Neck supple.     Right lower leg: No tenderness. No edema.     Left lower leg: No tenderness. No edema.  Skin:    General: Skin is warm and dry.     Capillary Refill: Capillary refill takes less than 2 seconds.  Neurological:     General: No focal deficit present.     Mental Status: He is alert.  Psychiatric:        Mood and Affect: Mood and affect and mood normal.        Behavior: Behavior normal.     ED Results / Procedures / Treatments   Labs (all labs ordered are listed, but only abnormal results are displayed) Labs Reviewed  CBC WITH DIFFERENTIAL/PLATELET - Abnormal; Notable for the following components:      Result Value   WBC 3.9 (*)    RBC 3.24 (*)    Hemoglobin 9.4 (*)    HCT 30.7 (*)    RDW 16.4 (*)    All other components  within normal limits  BASIC METABOLIC PANEL - Abnormal; Notable for the following components:   Glucose, Bld 106 (*)    Creatinine,  Ser 2.53 (*)    GFR, Estimated 27 (*)    All other components within normal limits  PROTIME-INR - Abnormal; Notable for the following components:   Prothrombin Time 20.1 (*)    INR 1.8 (*)    All other components within normal limits  BRAIN NATRIURETIC PEPTIDE - Abnormal; Notable for the following components:   B Natriuretic Peptide 1,014.9 (*)    All other components within normal limits  TROPONIN I (HIGH SENSITIVITY) - Abnormal; Notable for the following components:   Troponin I (High Sensitivity) 65 (*)    All other components within normal limits  RESP PANEL BY RT-PCR (FLU A&B, COVID) ARPGX2  MAGNESIUM  HEPATIC FUNCTION PANEL  COOXEMETRY PANEL  COOXEMETRY PANEL  TSH  BASIC METABOLIC PANEL  TROPONIN I (HIGH SENSITIVITY)    EKG EKG Interpretation  Date/Time:  Tuesday February 02 2020 16:33:45 EST Ventricular Rate:  120 PR Interval:    QRS Duration: 107 QT Interval:  369 QTC Calculation: 522 R Axis:   -46 Text Interpretation: Atrial fibrillation Incomplete left bundle branch block Left ventricular hypertrophy Prolonged QT interval Confirmed by Madalyn Rob [id number]) on 01/29/2020 5:40:39 PM   Radiology CT ABDOMEN PELVIS WO CONTRAST  Result Date: 01/24/2020 CLINICAL DATA:  Left-sided abdominal pain for several days. EXAM: CT ABDOMEN AND PELVIS WITHOUT CONTRAST TECHNIQUE: Multidetector CT imaging of the abdomen and pelvis was performed following the standard protocol without IV contrast. COMPARISON:  CT abdomen pelvis dated January 14, 2020. FINDINGS: Lower chest: Unchanged cardiomegaly and trace right pleural effusion. Hepatobiliary: The gallbladder remains poorly marginated, containing gas and amorphous high attenuation material. Continued gallbladder wall thickening with focal area of low attenuation superior to the gallbladder and extending into segment 4B, communicating with the skin surface through a fistula tract that passes through the subcapsular liver and upper  right anterolateral abdominal wall (series 3, images 16-22). Appearance is similar to prior study. Pancreas: Unremarkable. No pancreatic ductal dilatation or surrounding inflammatory changes. Spleen: Normal in size without focal abnormality. Adrenals/Urinary Tract: Adrenal glands are unremarkable. Kidneys are normal, without renal calculi, focal lesion, or hydronephrosis. Mild circumferential bladder wall thickening likely related to under distension. Stomach/Bowel: Stomach is within normal limits. Appendix appears normal. No evidence of bowel wall thickening, distention, or inflammatory changes. Vascular/Lymphatic: Aortic atherosclerosis. Unchanged IVC filter. No enlarged abdominal or pelvic lymph nodes. Reproductive: Prostate is unremarkable. Other: No free fluid or pneumoperitoneum. Musculoskeletal: No acute or significant osseous findings. IMPRESSION: 1. Similar appearing chronic cholecystitis with small pericholecystic fluid collection extending into segment 4B and communicating with the skin surface through a fistula tract that passes through the subcapsular liver and upper right anterolateral abdominal wall. 2. Unchanged trace right pleural effusion. 3. Aortic Atherosclerosis (ICD10-I70.0). Electronically Signed   By: Titus Dubin M.D.   On: 02/03/2020 17:27   DG Chest Portable 1 View  Result Date: 01/26/2020 CLINICAL DATA:  69 year old male with shortness of breath EXAM: PORTABLE CHEST 1 VIEW COMPARISON:  Chest radiograph dated 12/31/2019. FINDINGS: Cardiomegaly with mild vascular congestion similar to prior radiograph. No focal consolidation, pleural effusion or pneumothorax. There is atherosclerotic calcification of the aortic arch. Left pectoral AICD device. Right-sided PICC in similar position. No acute osseous pathology. IMPRESSION: Cardiomegaly with mild vascular congestion. No significant interval change. Electronically Signed   By: Anner Crete M.D.   On: 02/15/2020 17:12  Procedures .Critical Care Performed by: Lucrezia Starch, MD Authorized by: Lucrezia Starch, MD   Critical care provider statement:    Critical care time (minutes):  45   Critical care was necessary to treat or prevent imminent or life-threatening deterioration of the following conditions:  Cardiac failure   Critical care was time spent personally by me on the following activities:  Discussions with consultants, evaluation of patient's response to treatment, examination of patient, ordering and performing treatments and interventions, ordering and review of laboratory studies, ordering and review of radiographic studies, pulse oximetry, re-evaluation of patient's condition, obtaining history from patient or surrogate and review of old charts   (including critical care time)  Medications Ordered in ED Medications  nitroGLYCERIN (NITROSTAT) SL tablet 0.4 mg (has no administration in time range)  acetaminophen (TYLENOL) tablet 650 mg (has no administration in time range)  ondansetron (ZOFRAN) injection 4 mg (has no administration in time range)  zolpidem (AMBIEN) tablet 5 mg (has no administration in time range)  ALPRAZolam (XANAX) tablet 0.25 mg (has no administration in time range)  apixaban (ELIQUIS) tablet 5 mg (5 mg Oral Given 01/20/2020 2348)  calcium carbonate (TUMS - dosed in mg elemental calcium) chewable tablet 200 mg of elemental calcium (has no administration in time range)  DOBUTamine (DOBUTREX) infusion 4000 mcg/mL (3 mcg/kg/min  79.8 kg Intravenous New Bag/Given 01/30/2020 2327)  gabapentin (NEURONTIN) capsule 300 mg (300 mg Oral Given 02/08/2020 2328)  pantoprazole (PROTONIX) EC tablet 40 mg (has no administration in time range)  potassium chloride SA (KLOR-CON) CR tablet 20 mEq (has no administration in time range)  furosemide (LASIX) injection 80 mg (80 mg Intravenous Given 01/21/2020 2241)  atorvastatin (LIPITOR) tablet 40 mg (40 mg Oral Given 02/13/2020 2328)  amiodarone  (NEXTERONE) 1.8 mg/mL load via infusion 150 mg (150 mg Intravenous Not Given 01/29/2020 2258)  amiodarone (NEXTERONE PREMIX) 360-4.14 MG/200ML-% (1.8 mg/mL) IV infusion (60 mg/hr Intravenous New Bag/Given 01/27/2020 2324)    Followed by  amiodarone (NEXTERONE PREMIX) 360-4.14 MG/200ML-% (1.8 mg/mL) IV infusion (has no administration in time range)  acetaminophen (TYLENOL) tablet 500 mg (500 mg Oral Given 02/01/2020 1844)  amiodarone (NEXTERONE) IV bolus only 150 mg/100 mL (0 mg Intravenous Stopped 01/29/2020 2338)    ED Course  I have reviewed the triage vital signs and the nursing notes.  Pertinent labs & imaging results that were available during my care of the patient were reviewed by me and considered in my medical decision making (see chart for details).  Clinical Course as of 02/03/20 0000  Tue Feb 02, 2020  1856 Discussed with Suanne Marker from cardiology, their team will, assess patient and provide further recommendations [RD]    Clinical Course User Index [RD] Lucrezia Starch, MD   MDM Rules/Calculators/A&P                         69 year old male complex heart failure patient followed closely by heart failure clinic on home dobutamine presents to the emergency room with concern for shortness of breath and elevated heart rate.  Patient also mentioned some left-sided abdominal pain.  Regarding abdominal pain, he has chronic cholecystitis and previously had a drain.  His pain today was more in the left side, minimal tenderness on exam.  CT scan negative for any acute pathology.  Regarding his heart rate, found to be back in A. fib with RVR.  On last admission had underwent cardioversion and was discharged on amiodarone.  Regarding his shortness of breath suspect he is an heart failure exacerbation.  Consult to cardiology who came to bedside to evaluate and assume care of patient.  They will start IV diuretics, IV amnio and admit to ICU.  Final Clinical Impression(s) / ED Diagnoses Final  diagnoses:  Atrial fibrillation, unspecified type ([place])  Atrial fibrillation with rapid ventricular response ([name])  Acute on chronic heart failure, unspecified heart failure type [facility])    Rx / DC Orders ED Discharge Orders    None       Lucrezia Starch, MD 02/03/20 0000

## 2020-02-02 NOTE — H&P (Addendum)
Advanced Heart Failure Team History and Physical Note   Primary Physician:  Gerlene Fee, DO Primary Cardiologist:  DB  Reason for Admission: CHF exacerbation and rapid atrial fib   HPI:   Mr Cu is a 69 y.o.malewith a hx of chronic systolic HF 2/2 NICM s/pBSciICD,persistentatrial fibrillation, h/o stroke (Lg L MCA infarct w/ hemorrhagic conversionin 2018),HTN, HCV,tobacco abuse (smokes ~1/4 ppd),  and chronic cholecystitiswith achronic perc drain.    Info from 11/05-11/29/2021 admission: 1. Acute on chronic systolic biventricular  HFdue to NICM -> cardiogenic shock - suspect AF cardiomyopathy - ICD placed Nov 2015 - [facility] 10/2018 showed normal cors, elevated filling pressures and low CI at 1.9 - TTE10/2020 LVEF 20%, mild RV dysfunction - Admit 06/2019 with severe HF, required IABP and milrinone. TEE 5/21EF 20-25% with moderate to severe RV dysfunction. - Admitted 12/25/2019 with ADHF. Milrinone 0.25 mcg started 11/7 due to low output HF. Echo 12/28/19 EF 20-25% RV mildly decreased. Moved to ICU on 11/8 due to persistently low co-ox on milrinone. 12/29/19: Continued w/ persistently low co-ox despite milrinone 0.5 and NE which was stopped d/t elevated MAPs. Gave metolazone 2/2 low UOP. Transitioned to dobutamine gtt starting at 2.5 mcg then increased to 3 mcg. Required NE but has been off since 11/11.  - Suspect RV function worse than it looks on echo.  - Back on dobutamine 3  CVP 8 co-ox 71% -> 50%. Will repeat   - Volume status good on torsemide 60 daily.  - Continue with dobutamine 3. Failed wean 2x with dropping co-ox and rising creatinine - No beta blockers due to low output HF - No dig or ACE/ARB/ARNI with worsening renal function. - Options remain very limited - poor LVAD candidate with noncompliance and renal failure.  - Plan home with dobutamine once wound site stable. PICC in place - Main issue seems to be RV failure. Hard to know what endpoint  will be a this time. Hopefully will have some EF improvement with maintaining NSR.   2. Concerning for acute ischemic R MCA -hx of large R MCA with hemorrhagic conversion in Sept 2020 & L MCA s/p thrombectomy M2 occlusion in 2018.  -11/10 around 14:00 developed new L facial droop, LUE weakness and slurred speech - Code Stroke called.  Neuro recs appreciated, did not tolerate change in ICD settings for MRI/MRA; Repeat CT on 11/12 no acute findings, prior remote R MCA infarct. - A1c 4.6%, LDL 63, HDL 26, Cholesterol 103, Triglycerides 68 - Atrovastatin dose increased from home dose.  - No change  3.PersistentAfib with RVR - In 5/21 had refractory AF requiring multiple DC-CVs, high-dose amio and ranexa.  -He reported intermittent compliance with amio and Eliquis. Out of eliquis prior to admission. Transitioned from eliquis to heparin gtt due to possible need for procedures on 11/9 and planned for TEE DC-CV was scheduled 11/10, however canceled due to new neuro symptoms. - Stopped ranexa on 11/9. - DCCV 11/17 converted to SR. - Back in AF on 11/25 transiently. Converted with IV amio 150 x 1 - Continue amio 200 bid. - Not candidate for AV node ablation CRT currently with possible infection of GB drain tract - Continue with eliquis 5 mg BID.  - No bleeding   4. Acute on CKD IIIb - BaselineCr looks to be around 1.6 to 1.7 - Admission Cr 1.69 and Peak Cr 2.9 - Today creatinine 2.35 -> 2.08 -> 2.22 - Likely ATN/cardiorenal - Continue dobutamine at 3 for supprot -  Continue to monitor BMET daily   5. Elevated troponin - 10/2018 cath clean coronaries - elevated trops due to afib with RVR, CHF  6. Chronic cholecystitis with perc drain - Continues with increasing drainage. Remains AF. WBC ok.  - CT 11/25 suggested of tract infection +/- hepatic abscess - Wound cx pending.  - have asked [facility] to re-evaluate - If needs surgery he is probably as optimized as possible currently though still  high risk. Would need to bridge with heparin   7. Iron deficiency anemia - Iron sats low and has received feraheme. - Hgb stable   Pt came to the ER 12/14 with orthopnea, PND, DOE. Has not been on Lasix, has been taking torsemide. Was urinating frequently and only gained 2 lbs. However, his SOB dramatically worsened in the last couple of days.   Pt also in rapid atrial fibrillation. He says that started a couple of days ago. He is aware of the atrial fibrillation. Some chest pain, has had at times for a long time. Today, sx became just too much and he came to the ER.   Review of Systems: [y] = yes, [ ]  = no   General: Weight gain [ x]; Weight loss [ ] ; Anorexia [ ] ; Fatigue [ x]; Fever [ ] ; Chills [ ] ; Weakness [ ]   Cardiac: Chest pain/pressure [x ]; Resting SOB [ x]; Exertional SOB [x ]; Orthopnea [ x]; Pedal Edema [ ] ; Palpitations [ x]; Syncope [ ] ; Presyncope [ ] ; Paroxysmal nocturnal dyspnea[ ]   Pulmonary: Cough [ ] ; Wheezing[ ] ; Hemoptysis[ ] ; Sputum [ ] ; Snoring [ ]   GI: Vomiting[ ] ; Dysphagia[ ] ; Melena[ ] ; Hematochezia [ ] ; Heartburn[ ] ; Abdominal pain [ ] ; Constipation [ ] ; Diarrhea [ ] ; BRBPR [ ]   GU: Hematuria[ ] ; Dysuria [ ] ; Nocturia[ ]   Vascular: Pain in legs with walking [ ] ; Pain in feet with lying flat [ ] ; Non-healing sores [ ] ; Stroke [ ] ; TIA [ ] ; Slurred speech [ ] ;  Neuro: Headaches[ ] ; Vertigo[ ] ; Seizures[ ] ; Paresthesias[ ] ;Blurred vision [ ] ; Diplopia [ ] ; Vision changes [ ]   Ortho/Skin: Arthritis [ ] ; Joint pain [ ] ; Muscle pain [ ] ; Joint swelling [ ] ; Back Pain [ ] ; Rash [ ]   Psych: Depression[ ] ; Anxiety[ ]   Heme: Bleeding problems [ ] ; Clotting disorders [ ] ; Anemia [ ]   Endocrine: Diabetes [ ] ; Thyroid dysfunction[ ]   Home Medications Prior to Admission medications   Medication Sig Start Date End Date Taking? Authorizing Provider  acetaminophen (TYLENOL) 325 MG tablet Take 2 tablets (650 mg total) by mouth every 6 (six) hours as needed for mild pain (or  Fever >/= 101). 07/16/19   Clegg, Amy D, NP  amiodarone (PACERONE) 200 MG tablet Take 1 tablet (200 mg total) by mouth 2 (two) times daily. 01/18/20   Lyda Jester M, PA-C  apixaban (ELIQUIS) 5 MG TABS tablet Take 1 tablet (5 mg total) by mouth 2 (two) times daily. 01/18/20   Lyda Jester M, PA-C  atorvastatin (LIPITOR) 40 MG tablet Take 1 tablet (40 mg total) by mouth every evening. 01/18/20   Consuelo Pandy, PA-C  calcium carbonate (TUMS - DOSED IN MG ELEMENTAL CALCIUM) 500 MG chewable tablet Chew 1 tablet (200 mg of elemental calcium total) by mouth daily as needed for indigestion or heartburn. 03/12/19   [name], MD  diazepam (VALIUM) 5 MG tablet Take 2.5-5 mg by mouth 3 (three) times daily as needed for anxiety.  [provider]  DOBUTamine (DOBUTREX) 4-5 MG/ML-% infusion Inject 233.7 mcg/min into the vein continuous. 01/18/20   Lyda Jester M, PA-C  docusate sodium (COLACE) 100 MG capsule Take 1 capsule (100 mg total) by mouth 2 (two) times daily. 07/16/19   Clegg, Amy D, NP  gabapentin (NEURONTIN) 300 MG capsule Take 2 capsules (600 mg total) by mouth 3 (three) times daily. Patient taking differently: Take 300 mg by mouth 2 (two) times daily.  09/11/19   Autry-Lott, Naaman Plummer, DO  pantoprazole (PROTONIX) 40 MG tablet Take 1 tablet (40 mg total) by mouth daily. 07/16/19   Clegg, Amy D, NP  potassium chloride SA (KLOR-CON) 20 MEQ tablet Take 1 tablet (20 mEq total) by mouth daily. 01/18/20   Lyda Jester M, PA-C  torsemide (DEMADEX) 20 MG tablet Take 3 tablets (60 mg total) by mouth daily. 01/18/20   Consuelo Pandy, PA-C    Past Medical History: Past Medical History:  Diagnosis Date  . Acute blood loss anemia   . Acute CVA (cerebrovascular accident) ([place])   . Acute deep vein thrombosis (DVT) of both lower extremities ([name])   . Acute kidney injury ([place])   . Acute on chronic combined systolic and diastolic CHF (congestive heart failure) ([place])    . Acute renal failure superimposed on stage 3a chronic kidney disease ([place])   . Acute respiratory failure with hypoxia ([place])   . Atrial fibrillation ([place])   . Carpal tunnel syndrome of right wrist 02/28/2018  . Cerebral edema ([place]) 11/13/2018  . Cerebral infarction ([place])   . CHF (congestive heart failure) ([place])   . Cholecystitis 02/04/2019  . Chronic right hip pain   . DCM (dilated cardiomyopathy) ([place])   . Dysphagia, post-stroke   . Elevated troponin   . Entrapment of right ulnar nerve 02/28/2018  . Headache due to intracranial disease 11/14/2018  . Hepatitis C   . History of hemorrhagic stroke with residual hemiparesis ([place]) 02/04/2019  . HTN (hypertension) 08/14/2016  . Hyperlipidemia LDL goal <70 11/13/2018  . Hypertension   . ICD (implantable cardioverter-defibrillator) in place 09/13/2016  . Impotence due to erectile dysfunction 09/30/2017  . Ischemic cardiomyopathy   . Labile blood glucose   . Left leg DVT ([place]) 02/04/2019  . Marijuana user 11/13/2018  . Paroxysmal atrial fibrillation ([name])   . Right middle cerebral artery stroke ([place]) 11/13/2018  . Solitary pulmonary nodule 06/10/2017   5 mm RUL nodule noted incidentally as part of CVA workup 08/2016. With smoking history would obtain low-dose CT scan 08/2017.   . Stroke (cerebrum) ([name]) Lg L MCA infarct w/ hemorrhagic conversion, embolic d/t AF 08/09/3084  . Stroke ([place])   . Trochanteric bursitis, right hip 11/14/2018  . Visit for monitoring Tikosyn therapy 03/26/2017    Past Surgical History: Past Surgical History:  Procedure Laterality Date  . CARDIAC DEFIBRILLATOR PLACEMENT  2015  . CARDIOVERSION N/A 10/10/2016   Procedure: CARDIOVERSION;  Surgeon: Dorothy Spark, MD;  Location: [place];  Service: Cardiovascular;  Laterality: N/A;  . CARDIOVERSION N/A 03/27/2017   Procedure: CARDIOVERSION;  Surgeon: Jerline Pain, MD;  Location: [place] ENDOSCOPY;  Service: Cardiovascular;  Laterality: N/A;  . CARDIOVERSION N/A 10/29/2018    Procedure: CARDIOVERSION;  Surgeon: Sanda Klein, MD;  Location: [place] ENDOSCOPY;  Service: Cardiovascular;  Laterality: N/A;  . CARDIOVERSION N/A 11/05/2018   Procedure: CARDIOVERSION;  Surgeon: Thayer Headings, MD;  Location: [facility] ENDOSCOPY;  Service: Cardiovascular;  Laterality: N/A;  . CARDIOVERSION N/A 07/02/2019   Procedure:  CARDIOVERSION;  Surgeon: Jolaine Artist, MD;  Location: [place];  Service: Cardiovascular;  Laterality: N/A;  . CARDIOVERSION N/A 07/14/2019   Procedure: CARDIOVERSION;  Surgeon: Jolaine Artist, MD;  Location: [facility] ENDOSCOPY;  Service: Cardiovascular;  Laterality: N/A;  . CARDIOVERSION N/A 12/30/2019   Procedure: CARDIOVERSION;  Surgeon: Jolaine Artist, MD;  Location: [place];  Service: Cardiovascular;  Laterality: N/A;  . CARDIOVERSION N/A 01/05/2020   Procedure: BEDSIDE CARDIOVERSION;  Surgeon: Jolaine Artist, MD;  Location: [place];  Service: Cardiovascular;  Laterality: N/A;  . EYE SURGERY Left 1990  . IABP INSERTION N/A 06/26/2019   Procedure: IABP INSERTION;  Surgeon: Jolaine Artist, MD;  Location: [place] CV LAB;  Service: Cardiovascular;  Laterality: N/A;  . IR CATHETER TUBE CHANGE  06/24/2019  . IR EXCHANGE BILIARY DRAIN  02/11/2019  . IR EXCHANGE BILIARY DRAIN  06/09/2019  . IR EXCHANGE BILIARY DRAIN  09/23/2019  . IR IVC FILTER PLMT / S&I /IMG GUID/MOD SED  12/04/2018  . IR PERC CHOLECYSTOSTOMY  12/13/2018  . IR PERCUTANEOUS ART THROMBECTOMY/INFUSION INTRACRANIAL INC DIAG ANGIO  09/05/2016  . IR RADIOLOGIST EVAL & MGMT  10/03/2016  . RADIOLOGY WITH ANESTHESIA N/A 09/05/2016   Procedure: RADIOLOGY WITH ANESTHESIA;  Surgeon: Luanne Bras, MD;  Location: [place];  Service: Radiology;  Laterality: N/A;  . RIGHT HEART CATH N/A 06/24/2019   Procedure: RIGHT HEART CATH;  Surgeon: Jolaine Artist, MD;  Location: [place] CV LAB;  Service: Cardiovascular;  Laterality: N/A;  . RIGHT HEART CATH N/A 01/13/2020   Procedure: RIGHT HEART CATH;   Surgeon: Jolaine Artist, MD;  Location: [place] CV LAB;  Service: Cardiovascular;  Laterality: N/A;  . RIGHT/LEFT HEART CATH AND CORONARY ANGIOGRAPHY N/A 11/03/2018   Procedure: RIGHT/LEFT HEART CATH AND CORONARY ANGIOGRAPHY;  Surgeon: Lorretta Harp, MD;  Location: [place] CV LAB;  Service: Cardiovascular;  Laterality: N/A;  . TEE WITHOUT CARDIOVERSION N/A 02/06/2019   Procedure: TRANSESOPHAGEAL ECHOCARDIOGRAM (TEE);  Surgeon: Skeet Latch, MD;  Location: [place];  Service: Cardiovascular;  Laterality: N/A;  . TEE WITHOUT CARDIOVERSION N/A 06/09/2019   Procedure: TRANSESOPHAGEAL ECHOCARDIOGRAM (TEE);  Surgeon: Buford Dresser, MD;  Location: [facility] ENDOSCOPY;  Service: Cardiovascular;  Laterality: N/A;  . TEE WITHOUT CARDIOVERSION N/A 12/30/2019   Procedure: TRANSESOPHAGEAL ECHOCARDIOGRAM (TEE);  Surgeon: Jolaine Artist, MD;  Location: [facility] OR;  Service: Cardiovascular;  Laterality: N/A;    Family History: Family History  Problem Relation Age of Onset  . High blood pressure Mother   . High blood pressure Father   . Stroke Maternal Aunt   . Heart disease Neg Hx    Family Status  Relation Name Status  . Mother  Deceased at age 62       internal bleeding  . Father  Deceased  . Mat Aunt  Deceased  . MGM  Deceased  . MGF  Deceased  . PGM  Deceased  . PGF  Deceased  . Neg Hx  (Not Specified)    Social History: Social History   Socioeconomic History  . Marital status: Single    Spouse name: Not on file  . Number of children: Not on file  . Years of education: 91 (some college)  . Highest education level: Not on file  Occupational History  . Occupation: disability  Tobacco Use  . Smoking status: Former Smoker    Packs/day: 0.50    Types: Cigarettes  . Smokeless tobacco: Never Used  . Tobacco comment:  a pack last three days  Vaping Use  . Vaping Use: Never used  Substance and Sexual Activity  . Alcohol use: Not Currently    Alcohol/week: 3.0  standard drinks    Types: 3 Cans of beer per week    Comment: pt stop drinking   . Drug use: Not Currently    Frequency: 2.0 times per week    Types: Marijuana    Comment: stop smoking   . Sexual activity: Yes    Partners: Female    Birth control/protection: Condom  Other Topics Concern  . Not on file  Social History Narrative  . Not on file   Social Determinants of Health   Financial Resource Strain: Low Risk   . Difficulty of Paying Living Expenses: Not very hard  Food Insecurity: No Food Insecurity  . Worried About Charity fundraiser in the Last Year: Never true  . Ran Out of Food in the Last Year: Never true  Transportation Needs: No Transportation Needs  . Lack of Transportation (Medical): No  . Lack of Transportation (Non-Medical): No  Physical Activity: Not on file  Stress: Not on file  Social Connections: Not on file    Allergies:  Allergies  Allergen Reactions  . Benadryl [Diphenhydramine] Palpitations    Objective:    Vital Signs:   Temp:  [98.3 F (36.8 C)] 98.3 F (36.8 C) (12/14 1634) Pulse Rate:  [46-134] 117 (12/14 1840) Resp:  [16-28] 16 (12/14 1840) BP: (138-153)/(105-108) 153/105 (12/14 1840) SpO2:  [94 %-100 %] 94 % (12/14 1840) Weight:  [75.3 kg] 75.3 kg (12/14 1635)   Filed Weights   02/08/2020 1635  Weight: 75.3 kg    Physical Exam: General:  Well appearing. +resp difficulty HEENT: normal Neck: supple. JVP elevated to jaw. Carotids 2+ bilat; no bruits. No lymphadenopathy or thryomegaly appreciated. Cor: PMI nondisplaced. Rapid and irregular rate & rhythm. No rubs, gallops, 2/6 murmurs. Lungs: decreased BS bases w/ rales Abdomen: soft, + tender, nondistended. No hepatosplenomegaly. No bruits or masses. Good bowel sounds. Extremities: no cyanosis, clubbing, rash, edema Neuro: alert & orientedx3, cranial nerves grossly intact. moves all 4 extremities w/o difficulty. Affect pleasant  Telemetry: Atrial fib, RVR  Labs: Basic Metabolic  Panel: Recent Labs  Lab 02/07/2020 1745  NA 139  K 3.9  CL 104  CO2 25  GLUCOSE 106*  BUN 18  CREATININE 2.53*  CALCIUM 9.3  MG 2.0    Liver Function Tests: Recent Labs  Lab 02/06/2020 1745  AST 15  ALT 11  ALKPHOS 85  BILITOT 0.8  PROT 7.7  ALBUMIN 3.9   No results for input(s): LIPASE, AMYLASE in the last 168 hours. No results for input(s): AMMONIA in the last 168 hours.  CBC: Recent Labs  Lab 01/29/2020 1745  WBC 3.9*  NEUTROABS 2.2  HGB 9.4*  HCT 30.7*  MCV 94.8  PLT 203    Cardiac Enzymes: No results for input(s): CKTOTAL, CKMB, CKMBINDEX, TROPONINI in the last 168 hours.  BNP: BNP (last 3 results) Recent Labs    06/23/19 1527 12/25/19 1757 02/01/2020 1745  BNP 613.2* 776.7* 1,014.9*    ProBNP (last 3 results) No results for input(s): PROBNP in the last 8760 hours.   CBG: No results for input(s): GLUCAP in the last 168 hours.  Coagulation Studies: Recent Labs    01/24/2020 1745  LABPROT 20.1*  INR 1.8*    Other results: EKG: normal EKG, normal sinus rhythm, unchanged from previous tracings, atrial fibrillation, rate  120.  Imaging: CT ABDOMEN PELVIS WO CONTRAST  Result Date: 01/23/2020 CLINICAL DATA:  Left-sided abdominal pain for several days. EXAM: CT ABDOMEN AND PELVIS WITHOUT CONTRAST TECHNIQUE: Multidetector CT imaging of the abdomen and pelvis was performed following the standard protocol without IV contrast. COMPARISON:  CT abdomen pelvis dated January 14, 2020. FINDINGS: Lower chest: Unchanged cardiomegaly and trace right pleural effusion. Hepatobiliary: The gallbladder remains poorly marginated, containing gas and amorphous high attenuation material. Continued gallbladder wall thickening with focal area of low attenuation superior to the gallbladder and extending into segment 4B, communicating with the skin surface through a fistula tract that passes through the subcapsular liver and upper right anterolateral abdominal wall (series 3,  images 16-22). Appearance is similar to prior study. Pancreas: Unremarkable. No pancreatic ductal dilatation or surrounding inflammatory changes. Spleen: Normal in size without focal abnormality. Adrenals/Urinary Tract: Adrenal glands are unremarkable. Kidneys are normal, without renal calculi, focal lesion, or hydronephrosis. Mild circumferential bladder wall thickening likely related to under distension. Stomach/Bowel: Stomach is within normal limits. Appendix appears normal. No evidence of bowel wall thickening, distention, or inflammatory changes. Vascular/Lymphatic: Aortic atherosclerosis. Unchanged IVC filter. No enlarged abdominal or pelvic lymph nodes. Reproductive: Prostate is unremarkable. Other: No free fluid or pneumoperitoneum. Musculoskeletal: No acute or significant osseous findings. IMPRESSION: 1. Similar appearing chronic cholecystitis with small pericholecystic fluid collection extending into segment 4B and communicating with the skin surface through a fistula tract that passes through the subcapsular liver and upper right anterolateral abdominal wall. 2. Unchanged trace right pleural effusion. 3. Aortic Atherosclerosis (ICD10-I70.0). Electronically Signed   By: Titus Dubin M.D.   On: 02/09/2020 17:27   DG Chest Portable 1 View  Result Date: 01/23/2020 CLINICAL DATA:  69 year old male with shortness of breath EXAM: PORTABLE CHEST 1 VIEW COMPARISON:  Chest radiograph dated 12/31/2019. FINDINGS: Cardiomegaly with mild vascular congestion similar to prior radiograph. No focal consolidation, pleural effusion or pneumothorax. There is atherosclerotic calcification of the aortic arch. Left pectoral AICD device. Right-sided PICC in similar position. No acute osseous pathology. IMPRESSION: Cardiomegaly with mild vascular congestion. No significant interval change. Electronically Signed   By: Anner Crete M.D.   On: 02/08/2020 17:12        Assessment:   Principal Problem:   Acute on  chronic combined systolic and diastolic CHF (congestive heart failure) ([name]) Active Problems:   CKD (chronic kidney disease) stage 3, GFR 30-59 ml/min ([name])   Persistent atrial fibrillation with rapid ventricular response ([name])    Plan/Discussion:    1. CHF exacerbation - admit ICU, continue dobutamine - DB to determine diuretic dosing and decide if milrinone is again needed.  2. Atrial fib, RVR - may be the cause of the CHF exacerbation - will give IV amio x 1 and continue po rx - continue Eliquis  3. CKD III - follow renal function while here  Otherwise, continue home rx   Length of Stay: 0   Rhonda Barrett 01/27/2020, 7:38 PM  Advanced Heart Failure Team Pager [phone] (M-F; 7a - 4p)  Please contact [facility] for night-coverage after hours (4p -7a ) and weekends on [email or web address]  Patient seen and examined with the above-signed Advanced Practice Provider and/or Housestaff. I personally reviewed laboratory data, imaging studies and relevant notes. I independently examined the patient and formulated the important aspects of the plan. I have edited the note to reflect any of my changes or salient points. I have personally discussed the plan with the patient and/or family.  [place]  y/o male with severe systolic HF due to NICM with biventricular dysfunction, persistent AF, CKD IV, chronic cholecystitis/hepatic abscess. Recently with prolonged hospitalization for cardiogenic shock and AF requiring multiple cardioversions and discharged on home dobutamine.   Says he was feeling fine until 2-3 days ago when he went back into AF. Now with worsening dyspnea, orthopnea and PND.  Came to ER. In AF with RVR at 120. JVP to jaw.   General:  Sitting up in bed. Mildly orthopneic HEENT: normal Neck: supple.JVP to jaw Carotids 2+ bilat; no bruits. No lymphadenopathy or thryomegaly appreciated. Cor: PMI nondisplaced.Irregualr tachy +s3 Lungs: clear Abdomen: soft, + tender over RUQ, mildly  distended. No reboundNo bruits or masses. Good bowel sounds. Extremities: no cyanosis, clubbing, rash, edema Neuro: alert & orientedx3, cranial nerves grossly intact. moves all 4 extremities w/o difficulty. Affect pleasant  Will admit continue dobutamine. Check co-ox. Diurese with IV lasix. Start IV amio. Continue Eliquis. Will need repeat DC-CV. Could consider AVN ablation with CRT-P but not good candidate for device with chronic liver abscess/infection. Options limited.   Glori Bickers, MD  9:41 PM

## 2020-02-02 NOTE — ED Notes (Signed)
Admitting provider at bedside.

## 2020-02-02 NOTE — Patient Outreach (Signed)
[facility]) Care Management  01/26/2020  Glenn [name] Feb 29, 1950 [id number]   EMMI Red Flag   Day : 13 Date: 02/01/20 Red Alert Reason : Martin Majestic to follow up appointment ? "I don't know"  Patient with admission at [facility] 11/5-11/29/21 Dx: Acute on Chronic biventricular HF due to NICM, persistant Atrial fibrillation with RVE , Concerning for ischemic R MCA,   Outreach attempt #1 Subjective: Outreach attempt to patient contact number , no answer able to leave a HIPAA compliant message for return call.  Placed call to patient son Glenn [name], on designated party release. Explained reason for the call and EMMI automated discharge follow up call response.  Addressed EMMI Red Alert reason : Went to follow up appointment? "I don't know" automated response. [name] explained patient up coming appointments, with Heart failure Clinic on 03/09/20 and General surgeon office on 02/22/19. [name] states that he will provide transportation to appointment emphasized attending visit. He denies any other patient concerns at this time, reinforced notifying MD office of new concerns and 911 for stroke signs symptoms. Discussed ongoing follow up  by [facility].  He discussed patient still having home health RN visit and no new concerns at this time.    Plan  Will close case to  [facility] care management at this time, will send this EMMI follow up note to [name] , [facility] Chronic care management.    Joylene Draft, RN, BSN  [place] Management Coordinator  [phone]- Mobile [phone]- Toll Free Main Office

## 2020-02-02 NOTE — Telephone Encounter (Signed)
Renee Physical Therapist w/[facility] left vm to report patient has 6/10 abdominal pain and c/o nausea.  Pts heart rate is 137-150, blood pressure 132/82, O2 98%. Thinks pt may be in afib Pt has not been seen in clinic but has hospital follow up 1/19.  Routed to [place] for advice

## 2020-02-02 NOTE — Telephone Encounter (Signed)
I saw patient in ER

## 2020-02-02 NOTE — ED Triage Notes (Signed)
BIB EMS from home in hotel where he lives with son. Was d/c from hospital 2 months ago where he was a patient for about 2 months. At discharge the instructions said to stop the fluid pills so he hasn't taken them since. Worsening SOB with exertion, sitting straight up in the bed to sleep, and was unable to do the exercises with PT due to SOB. Also the drain for the gall bladder on the right chest was accidentally cut 6 weeks ago. Has PICC on the right arm with 2 lumens with dobutamine drip at 3 mcg/kg/min (3.4 ml/hr).

## 2020-02-03 LAB — MRSA PCR SCREENING: MRSA by PCR: NEGATIVE

## 2020-02-03 LAB — BASIC METABOLIC PANEL
Anion gap: 13 (ref 5–15)
BUN: 19 mg/dL (ref 8–23)
CO2: 23 mmol/L (ref 22–32)
Calcium: 8.9 mg/dL (ref 8.9–10.3)
Chloride: 103 mmol/L (ref 98–111)
Creatinine, Ser: 2.42 mg/dL — ABNORMAL HIGH (ref 0.61–1.24)
GFR, Estimated: 29 mL/min — ABNORMAL LOW (ref >60)
Glucose, Bld: 142 mg/dL — ABNORMAL HIGH (ref 70–99)
Potassium: 3.4 mmol/L — ABNORMAL LOW (ref 3.5–5.1)
Sodium: 139 mmol/L (ref 135–145)

## 2020-02-03 LAB — COOXEMETRY PANEL
Carboxyhemoglobin: 1.4 % (ref 0.5–1.5)
Methemoglobin: 0.9 % (ref 0.0–1.5)
O2 Saturation: 53 %
Total hemoglobin: 8.3 g/dL — ABNORMAL LOW (ref 12.0–16.0)

## 2020-02-03 LAB — TROPONIN I (HIGH SENSITIVITY): Troponin I (High Sensitivity): 63 ng/L — ABNORMAL HIGH (ref <18)

## 2020-02-03 LAB — TSH: TSH: 4.27 u[IU]/mL (ref 0.350–4.500)

## 2020-02-03 MED ORDER — POTASSIUM CHLORIDE CRYS ER 20 MEQ PO TBCR
40.0000 meq | EXTENDED_RELEASE_TABLET | Freq: Two times a day (BID) | ORAL | Status: DC
  Administered 2020-02-03 (×2): 40 meq via ORAL
  Filled 2020-02-03 (×2): qty 2

## 2020-02-03 MED ORDER — POTASSIUM CHLORIDE 10 MEQ/50ML IV SOLN
10.0000 meq | INTRAVENOUS | Status: AC
  Administered 2020-02-03 (×4): 10 meq via INTRAVENOUS
  Filled 2020-02-03 (×4): qty 50

## 2020-02-03 MED ORDER — METOLAZONE 5 MG PO TABS
5.0000 mg | ORAL_TABLET | Freq: Once | ORAL | Status: AC
  Administered 2020-02-03: 10:00:00 5 mg via ORAL
  Filled 2020-02-03: qty 1

## 2020-02-03 MED ORDER — FUROSEMIDE 10 MG/ML IJ SOLN
120.0000 mg | Freq: Two times a day (BID) | INTRAMUSCULAR | Status: DC
  Administered 2020-02-03: 17:00:00 120 mg via INTRAVENOUS
  Filled 2020-02-03: qty 12

## 2020-02-03 MED ORDER — SODIUM CHLORIDE 0.9% FLUSH: 10.0000 mL | INTRAVENOUS | Status: DC | PRN

## 2020-02-03 MED ORDER — SODIUM CHLORIDE 0.9% FLUSH
10.0000 mL | Freq: Two times a day (BID) | INTRAVENOUS | Status: DC
  Administered 2020-02-03 – 2020-02-13 (×16): 10 mL
  Administered 2020-02-14: 10:00:00 20 mL

## 2020-02-03 MED ORDER — CHLORHEXIDINE GLUCONATE CLOTH 2 % EX PADS
6.0000 | MEDICATED_PAD | Freq: Every day | CUTANEOUS | Status: DC
  Administered 2020-02-03 – 2020-02-14 (×10): 6 via TOPICAL

## 2020-02-03 NOTE — ED Notes (Signed)
Pt up and ambulated to bathroom

## 2020-02-03 NOTE — Progress Notes (Addendum)
Advanced Heart Failure Rounding Note  PCP-Cardiologist: Virl Axe, MD   Subjective:    Admitted with a/c systolic heart failure and A fib RVR. On dobutamine 3 mcg chronically.   Given IV lasix + amio drip.   Tired today. Didn't sleep much.   Objective:   Weight Range: 75.8 kg Body mass index is 26.17 kg/m.   Vital Signs:   Temp:  [97.6 F (36.4 C)-98.3 F (36.8 C)] 97.6 F (36.4 C) (12/15 0808) Pulse Rate:  [46-134] 117 (12/15 0015) Resp:  [13-44] 17 (12/15 0900) BP: (84-153)/(57-119) 101/75 (12/15 0900) SpO2:  [93 %-100 %] 97 % (12/15 0900) Weight:  [75.3 kg-79.8 kg] 75.8 kg (12/15 0100) Last BM Date: 02/03/20  Weight change: Filed Weights   02/07/2020 1635 02/01/2020 1900 02/03/20 0100  Weight: 75.3 kg 79.8 kg 75.8 kg    Intake/Output:   Intake/Output Summary (Last 24 hours) at 02/03/2020 0908 Last data filed at 02/03/2020 0702 Gross per 24 hour  Intake 236.65 ml  Output 275 ml  Net -38.35 ml      Physical Exam    General: In the bed. . No resp difficulty HEENT: Normal Neck: Supple. JVP 9-10  . Carotids 2+ bilat; no bruits. No lymphadenopathy or thyromegaly appreciated. Cor: PMI nondisplaced. Irregular rate & rhythm. No rubs, gallops or murmurs.Tunneled PICC.  Lungs: Clear Abdomen: Soft, nontender, nondistended. No hepatosplenomegaly. No bruits or masses. Good bowel sounds. RUQ drain.  Extremities: No cyanosis, clubbing, rash, edema.  Neuro: Alert & orientedx3, cranial nerves grossly intact. moves all 4 extremities w/o difficulty. Affect pleasant   Telemetry    A Fib  100s personally reviewed.   EKG    N/a   Labs    CBC Recent Labs    02/03/2020 1745  WBC 3.9*  NEUTROABS 2.2  HGB 9.4*  HCT 30.7*  MCV 94.8  PLT 161   Basic Metabolic Panel Recent Labs    02/03/2020 1745 02/03/20 0545  NA 139 139  K 3.9 3.4*  CL 104 103  CO2 25 23  GLUCOSE 106* 142*  BUN 18 19  CREATININE 2.53* 2.42*  CALCIUM 9.3 8.9  MG 2.0  --    Liver  Function Tests Recent Labs    01/20/2020 1745  AST 15  ALT 11  ALKPHOS 85  BILITOT 0.8  PROT 7.7  ALBUMIN 3.9   No results for input(s): LIPASE, AMYLASE in the last 72 hours. Cardiac Enzymes No results for input(s): CKTOTAL, CKMB, CKMBINDEX, TROPONINI in the last 72 hours.  BNP: BNP (last 3 results) Recent Labs    06/23/19 1527 12/25/19 1757 01/30/2020 1745  BNP 613.2* 776.7* 1,014.9*    ProBNP (last 3 results) No results for input(s): PROBNP in the last 8760 hours.   D-Dimer No results for input(s): DDIMER in the last 72 hours. Hemoglobin A1C No results for input(s): HGBA1C in the last 72 hours. Fasting Lipid Panel No results for input(s): CHOL, HDL, LDLCALC, TRIG, CHOLHDL, LDLDIRECT in the last 72 hours. Thyroid Function Tests Recent Labs    02/03/20 0545  TSH 4.270    Other results:   Imaging    CT ABDOMEN PELVIS WO CONTRAST  Result Date: 01/29/2020 CLINICAL DATA:  Left-sided abdominal pain for several days. EXAM: CT ABDOMEN AND PELVIS WITHOUT CONTRAST TECHNIQUE: Multidetector CT imaging of the abdomen and pelvis was performed following the standard protocol without IV contrast. COMPARISON:  CT abdomen pelvis dated January 14, 2020. FINDINGS: Lower chest: Unchanged cardiomegaly and trace right  pleural effusion. Hepatobiliary: The gallbladder remains poorly marginated, containing gas and amorphous high attenuation material. Continued gallbladder wall thickening with focal area of low attenuation superior to the gallbladder and extending into segment 4B, communicating with the skin surface through a fistula tract that passes through the subcapsular liver and upper right anterolateral abdominal wall (series 3, images 16-22). Appearance is similar to prior study. Pancreas: Unremarkable. No pancreatic ductal dilatation or surrounding inflammatory changes. Spleen: Normal in size without focal abnormality. Adrenals/Urinary Tract: Adrenal glands are unremarkable. Kidneys  are normal, without renal calculi, focal lesion, or hydronephrosis. Mild circumferential bladder wall thickening likely related to under distension. Stomach/Bowel: Stomach is within normal limits. Appendix appears normal. No evidence of bowel wall thickening, distention, or inflammatory changes. Vascular/Lymphatic: Aortic atherosclerosis. Unchanged IVC filter. No enlarged abdominal or pelvic lymph nodes. Reproductive: Prostate is unremarkable. Other: No free fluid or pneumoperitoneum. Musculoskeletal: No acute or significant osseous findings. IMPRESSION: 1. Similar appearing chronic cholecystitis with small pericholecystic fluid collection extending into segment 4B and communicating with the skin surface through a fistula tract that passes through the subcapsular liver and upper right anterolateral abdominal wall. 2. Unchanged trace right pleural effusion. 3. Aortic Atherosclerosis (ICD10-I70.0). Electronically Signed   By: Titus Dubin M.D.   On: 01/21/2020 17:27   DG Chest Portable 1 View  Result Date: 01/29/2020 CLINICAL DATA:  69 year old male with shortness of breath EXAM: PORTABLE CHEST 1 VIEW COMPARISON:  Chest radiograph dated 12/31/2019. FINDINGS: Cardiomegaly with mild vascular congestion similar to prior radiograph. No focal consolidation, pleural effusion or pneumothorax. There is atherosclerotic calcification of the aortic arch. Left pectoral AICD device. Right-sided PICC in similar position. No acute osseous pathology. IMPRESSION: Cardiomegaly with mild vascular congestion. No significant interval change. Electronically Signed   By: Anner Crete M.D.   On: 02/08/2020 17:12      Medications:     Scheduled Medications: . amiodarone  150 mg Intravenous Once  . apixaban  5 mg Oral BID  . atorvastatin  40 mg Oral QPM  . Chlorhexidine Gluconate Cloth  6 each Topical Daily  . furosemide  80 mg Intravenous BID  . gabapentin  300 mg Oral BID  . pantoprazole  40 mg Oral Daily  .  potassium chloride SA  20 mEq Oral Daily  . sodium chloride flush  10-40 mL Intracatheter Q12H     Infusions: . amiodarone 30 mg/hr (02/03/20 0702)  . DOBUTamine 3 mcg/kg/min (02/03/20 0702)  . potassium chloride 10 mEq (02/03/20 0856)     PRN Medications:  acetaminophen, ALPRAZolam, calcium carbonate, nitroGLYCERIN, ondansetron (ZOFRAN) IV, sodium chloride flush, zolpidem    Patient Profile   Glenn [name] is a 69 y.o.malewith a hx of chronic systolic HF 2/2 NICM s/pBSciICD,persistentatrial fibrillation, h/o stroke (Lg L MCA infarct w/ hemorrhagic conversionin 2018),HTN, HCV,tobacco abuse (smokes ~1/4 ppd), and chronic cholecystitiswith achronic perc drain.      Assessment/Plan   1. A/C Systolic Heart Failure.  ICD placed Nov 2015 - [facility] 10/2018 showed normal cors, elevated filling pressures and low CI at 1.9 - TTE10/2020 LVEF 20%, mild RV dysfunctiondobutamine - On home dobutamine 3 mcg. CO-OX 53%.  - Volume status remains elevated. Give 120 mg IV lasix now + 5 mg metolazone.  - Set up CVP.  Supp K now.  -No bb/spiro/dig/arini with elevated creatinine.   2. Atrial fib, RVR - Most recent DC-CV 01/06/2020  - Continue amio drip. Set up cardioversion tomorrow.  - continue Eliquis  3. CKD III -Creatinine baseline ~2.  -  Admit creatinine 2.5-->2.4  -Follow renal function daily. .   4. H/O Ischemic R MCA , 2018  hx of large R MCA with hemorrhagic conversion in Sept 2020 & L MCA s/p thrombectomy M2 occlusion in 2018.  5.  Chronic cholecystitis with perc drain  6. Anemia  Hgb 8.3 today.  Received iron 12/2019 .  Length of Stay: 1  Amy Clegg, NP  02/03/2020, 9:08 AM  Advanced Heart Failure Team Pager [phone] (M-F; 7a - 4p)  Please contact [facility] for night-coverage after hours (4p -7a ) and weekends on [email or web address]  Agree with above.  Remains very tenuous in the setting of severe biventricular HF and recurrent AF. Co-ox 53% on dobutamine  3 (home dose). CVP 22. Minimal urine output with 80 IV lasix overnight.   General:  Weak appearing. No resp difficulty HEENT: normal Neck: supple. JVP to ear Carotids 2+ bilat; no bruits. No lymphadenopathy or thryomegaly appreciated. Cor: PMI nondisplaced. Irr + s3 Lungs: clear Abdomen: soft, + RUQ tender, nondistended. No hepatosplenomegaly. No bruits or masses. Good bowel sounds. Extremities: no cyanosis, clubbing, rash, edema Neuro: alert & orientedx3, cranial nerves grossly intact. moves all 4 extremities w/o difficulty. Affect pleasant  As above, remains very tenuous. Continue dobutamine at current dose for now. (may need to increase). Increase IV lasix and add metolazone. Continue IV amio and Eliquis. Will need repeat DC-CV when volume status improved. Supp K. Prognosis concerning.   CRITICAL CARE Performed by: Glori Bickers  Total critical care time: 35 minutes  Critical care time was exclusive of separately billable procedures and treating other patients.  Critical care was necessary to treat or prevent imminent or life-threatening deterioration.  Critical care was time spent personally by me (independent of midlevel providers or residents) on the following activities: development of treatment plan with patient and/or surrogate as well as nursing, discussions with consultants, evaluation of patient's response to treatment, examination of patient, obtaining history from patient or surrogate, ordering and performing treatments and interventions, ordering and review of laboratory studies, ordering and review of radiographic studies, pulse oximetry and re-evaluation of patient's condition.  Glori Bickers, MD  5:13 PM

## 2020-02-03 NOTE — Plan of Care (Signed)
  Problem: Education: Goal: Knowledge of General Education information will improve Description: Including pain rating scale, medication(s)/side effects and non-pharmacologic comfort measures Outcome: Progressing   Problem: Health Behavior/Discharge Planning: Goal: Ability to manage health-related needs will improve Outcome: Progressing   Problem: Clinical Measurements: Goal: Ability to maintain clinical measurements within normal limits will improve Outcome: Progressing Goal: Will remain free from infection Outcome: Progressing Goal: Diagnostic test results will improve Outcome: Progressing Goal: Respiratory complications will improve Outcome: Progressing Goal: Cardiovascular complication will be avoided Outcome: Progressing   Problem: Activity: Goal: Risk for activity intolerance will decrease Outcome: Progressing   Problem: Coping: Goal: Level of anxiety will decrease Outcome: Progressing   Problem: Elimination: Goal: Will not experience complications related to bowel motility Outcome: Progressing   Problem: Pain Managment: Goal: General experience of comfort will improve Outcome: Progressing   Problem: Safety: Goal: Ability to remain free from injury will improve Outcome: Progressing   Problem: Skin Integrity: Goal: Risk for impaired skin integrity will decrease Outcome: Progressing   Problem: Nutrition: Goal: Adequate nutrition will be maintained Outcome: Not Progressing Note: About 25% eaten during meals. Pt states he has a poor appetite because of his gallbladder.   Problem: Elimination: Goal: Will not experience complications related to urinary retention Outcome: Not Progressing Note: Minimal UOP response to 80mg  IV lasix. 120mg  IV lasix given at 1711. Bladder Scan 19mL

## 2020-02-04 ENCOUNTER — Encounter (HOSPITAL_COMMUNITY): Admission: EM | Disposition: E | Payer: Self-pay | Source: Home / Self Care | Attending: Internal Medicine

## 2020-02-04 LAB — COOXEMETRY PANEL
Carboxyhemoglobin: 0.8 % (ref 0.5–1.5)
Carboxyhemoglobin: 1.2 % (ref 0.5–1.5)
Carboxyhemoglobin: 0.8 % (ref 0.5–1.5)
Carboxyhemoglobin: 0.7 % (ref 0.5–1.5)
Methemoglobin: 0.5 % (ref 0.0–1.5)
Methemoglobin: 0.7 % (ref 0.0–1.5)
Methemoglobin: 0.7 % (ref 0.0–1.5)
Methemoglobin: 0.7 % (ref 0.0–1.5)
O2 Saturation: 32.9 %
O2 Saturation: 48.6 %
O2 Saturation: 24.2 %
O2 Saturation: 27.4 %
Total hemoglobin: 10.6 g/dL — ABNORMAL LOW (ref 12.0–16.0)
Total hemoglobin: 9.1 g/dL — ABNORMAL LOW (ref 12.0–16.0)
Total hemoglobin: 8.9 g/dL — ABNORMAL LOW (ref 12.0–16.0)
Total hemoglobin: 9.8 g/dL — ABNORMAL LOW (ref 12.0–16.0)

## 2020-02-04 LAB — BASIC METABOLIC PANEL
Anion gap: 12 (ref 5–15)
Anion gap: 12 (ref 5–15)
BUN: 22 mg/dL (ref 8–23)
BUN: 24 mg/dL — ABNORMAL HIGH (ref 8–23)
CO2: 23 mmol/L (ref 22–32)
CO2: 23 mmol/L (ref 22–32)
Calcium: 9.2 mg/dL (ref 8.9–10.3)
Calcium: 8.8 mg/dL — ABNORMAL LOW (ref 8.9–10.3)
Chloride: 101 mmol/L (ref 98–111)
Chloride: 98 mmol/L (ref 98–111)
Creatinine, Ser: 3.27 mg/dL — ABNORMAL HIGH (ref 0.61–1.24)
Creatinine, Ser: 3.39 mg/dL — ABNORMAL HIGH (ref 0.61–1.24)
GFR, Estimated: 20 mL/min — ABNORMAL LOW (ref >60)
GFR, Estimated: 19 mL/min — ABNORMAL LOW (ref >60)
Glucose, Bld: 120 mg/dL — ABNORMAL HIGH (ref 70–99)
Glucose, Bld: 235 mg/dL — ABNORMAL HIGH (ref 70–99)
Potassium: 4.5 mmol/L (ref 3.5–5.1)
Potassium: 4.1 mmol/L (ref 3.5–5.1)
Sodium: 136 mmol/L (ref 135–145)
Sodium: 133 mmol/L — ABNORMAL LOW (ref 135–145)

## 2020-02-04 LAB — MAGNESIUM: Magnesium: 2.1 mg/dL (ref 1.7–2.4)

## 2020-02-04 LAB — LACTIC ACID, PLASMA: Lactic Acid, Venous: 2.4 mmol/L (ref 0.5–1.9)

## 2020-02-04 SURGERY — CARDIOVERSION
Anesthesia: Monitor Anesthesia Care

## 2020-02-04 MED ORDER — FUROSEMIDE 10 MG/ML IJ SOLN
80.0000 mg | Freq: Once | INTRAMUSCULAR | Status: AC
  Administered 2020-02-04: 11:00:00 80 mg via INTRAVENOUS

## 2020-02-04 MED ORDER — NOREPINEPHRINE 4 MG/250ML-% IV SOLN
3.0000 ug/min | INTRAVENOUS | Status: DC
  Filled 2020-02-04: qty 250

## 2020-02-04 MED ORDER — NOREPINEPHRINE 4 MG/250ML-% IV SOLN
8.0000 ug/min | INTRAVENOUS | Status: DC
  Administered 2020-02-04: 16:00:00 3 ug/min via INTRAVENOUS
  Administered 2020-02-05 – 2020-02-08 (×9): 8 ug/min via INTRAVENOUS
  Administered 2020-02-08: 16:00:00 10 ug/min via INTRAVENOUS
  Administered 2020-02-08: 09:00:00 9 ug/min via INTRAVENOUS
  Administered 2020-02-08: 23:00:00 10 ug/min via INTRAVENOUS
  Administered 2020-02-09: 15:00:00 6 ug/min via INTRAVENOUS
  Administered 2020-02-09 – 2020-02-10 (×2): 8 ug/min via INTRAVENOUS
  Filled 2020-02-04 (×17): qty 250

## 2020-02-04 MED ORDER — FUROSEMIDE 10 MG/ML IJ SOLN
30.0000 mg/h | INTRAVENOUS | Status: DC
  Administered 2020-02-04: 12:00:00 20 mg/h via INTRAVENOUS
  Administered 2020-02-04 – 2020-02-11 (×21): 30 mg/h via INTRAVENOUS
  Filled 2020-02-04 (×32): qty 20

## 2020-02-04 NOTE — TOC Initial Note (Signed)
Transition of Care [facility]) - Initial/Assessment Note    Patient Details  Name: Glenn [name] MRN: [id number] Date of Birth: 31-Oct-1950  Transition of [facility]) CM/SW Contact:    Sharin Mons, RN Phone Number: [phone] 01/20/2020, 10:39 AM  Clinical Narrative:    Readmitted with CHF exacerbation and afib RVR.  From home with son. States PTA required assistance with ADL's. Pt with DME: rolling walker, BSC, W/C.Active with [facility] ( RN, PT,OT).   TOC team following for needs  Expected Discharge Plan: [place] (Resides with son)     Patient Goals and CMS Choice        Expected Discharge Plan and Services Expected Discharge Plan: [place] (Resides with son)                                              Prior Living Arrangements/Services       Do you feel safe going back to the place where you live?: Yes      Need for Family Participation in Patient Care: Yes (Comment) Care giver support system in place?: Yes (comment)   Criminal Activity/Legal Involvement Pertinent to Current Situation/Hospitalization: No - Comment as needed  Activities of Daily Living      Permission Sought/Granted   Permission granted to share information with : Yes, Verbal Permission Granted  Share Information with NAME: [name] (Son)  [phone]           Emotional Assessment Appearance:: Appears stated age Attitude/Demeanor/Rapport: Gracious Affect (typically observed): Accepting Orientation: : Oriented to Self,Oriented to Place,Oriented to  Time,Oriented to Situation Alcohol / Substance Use: Tobacco Use Psych Involvement: No (comment)  Admission diagnosis:  Atrial fibrillation with rapid ventricular response ([name]) [I48.91] Acute on chronic heart failure, unspecified heart failure type ([place]) [I50.9] Atrial fibrillation, unspecified type [facility]) [I48.91] Patient Active Problem List   Diagnosis Date Noted   . Atrial fibrillation with rapid ventricular response ([place]) 02/19/2020  . Anxiety 09/02/2019  . Malnutrition of moderate degree 07/14/2019  . Palliative care by specialist   . Atrial fibrillation with RVR ([place]) 06/23/2019  . Chronic systolic heart failure ([place]) 06/21/2019  . Persistent atrial fibrillation with rapid ventricular response ([place])   . Thrombus of left atrial appendage   . Left leg pain 03/27/2019  . History of hemorrhagic stroke with residual hemiparesis ([place]) 02/04/2019  . Left leg DVT ([place]) 02/04/2019  . Chronic cholecystitis 02/04/2019  . Acute deep vein thrombosis (DVT) of both lower extremities ([name])   . Acute blood loss anemia   . Labile blood glucose   . CKD (chronic kidney disease) stage 3, GFR 30-59 ml/min ([name])   . Chronic right hip pain   . Headache due to intracranial disease 11/14/2018  . Trochanteric bursitis, right hip 11/14/2018  . Cerebral edema ([place]) 11/13/2018  . Hyperlipidemia LDL goal <70 11/13/2018  . Marijuana user 11/13/2018  . Right middle cerebral artery stroke ([place]) 11/13/2018  . Acute CVA (cerebrovascular accident) ([place])   . Noncompliance   . Dysphagia, post-stroke   . Acute on chronic combined systolic and diastolic CHF (congestive heart failure) ([place])   . DCM (dilated cardiomyopathy) ([place])   . Atrial fibrillation ([place]) 11/04/2018  . Elevated troponin   . Acute respiratory failure with hypoxia ([place])   . Entrapment of right ulnar nerve 02/28/2018  .  Carpal tunnel syndrome of right wrist 02/28/2018  . Impotence due to erectile dysfunction 09/30/2017  . Solitary pulmonary nodule 06/10/2017  . Neck pain 04/06/2017  . Paroxysmal atrial fibrillation ([name])   . DNR (do not resuscitate) discussion 03/25/2017  . ICD (implantable cardioverter-defibrillator) in place 09/13/2016  . Chest pain 09/13/2016  . Tobacco abuse 09/13/2016  . Upper back pain 09/13/2016  . Housing problems 09/13/2016  . Ischemic cardiomyopathy   . Cerebral infarction  ([place])   . Stroke (cerebrum) ([name]) Lg L MCA infarct w/ hemorrhagic conversion, embolic d/t AF 74/16/3845  . HTN (hypertension) 08/14/2016  . Chronic Hepatitis C  08/14/2016   PCP:  Gerlene Fee, DO Pharmacy:   [facility] [place], [place] - [address] [place] [address] Phone: [phone] Fax: [phone]  [name] [place], [place] - [address] [place] [address] Phone: [phone] Fax: [phone]     Social Determinants of Health (SDOH) Interventions    Readmission Risk Interventions Readmission Risk Prevention Plan 07/15/2019 02/05/2019  Transportation Screening Complete Complete  Medication Review (RN Care Manager) - Complete  PCP or Specialist appointment within 3-5 days of discharge - Complete  HRI or [place] - Complete  SW Recovery Care/Counseling Consult Complete Complete  Palliative Care Screening Complete Not [place] Complete Not Applicable  Some recent data might be hidden

## 2020-02-04 NOTE — Progress Notes (Signed)
Mr. Jarchow has had approx CoOx-24.2% that was redrawn at CoOx-27.4%. Lactate-2.4 CVP -29 with increased work of breathing. UO of 280 thus far this shift while on Lasix gtt. Dr. Keturah Barre Bensimhon updated on assessment findings. Dobutamine gtt and Levophed gtt increased per order. Will continue to monitor.

## 2020-02-04 NOTE — Progress Notes (Signed)
Advanced Heart Failure Rounding Note  PCP-Cardiologist: Virl Axe, MD   Subjective:    Admitted with a/c systolic heart failure and A fib RVR. On dobutamine 3 mcg chronically.   Remains on amio gtt. Converted to NSR.   Co-ox 49% (was 33% early this am). Creatinine worse 2.4 -> 3.3   Feels ok. Denies sob, orthopnea or PND. No palpitations.    Objective:   Weight Range: 78.2 kg Body mass index is 27 kg/m.   Vital Signs:   Temp:  [96.9 F (36.1 C)-98.4 F (36.9 C)] 97.6 F (36.4 C) (12/16 0810) Resp:  [10-42] 20 (12/16 0600) BP: (98-187)/(58-131) 121/75 (12/16 0600) SpO2:  [92 %-100 %] 96 % (12/16 0600) Weight:  [78.2 kg] 78.2 kg (12/16 0500) Last BM Date: 02/03/20  Weight change: Filed Weights   02/08/2020 1900 02/03/20 0100 01/26/2020 0500  Weight: 79.8 kg 75.8 kg 78.2 kg    Intake/Output:   Intake/Output Summary (Last 24 hours) at 02/08/2020 0925 Last data filed at 02/15/2020 0810 Gross per 24 hour  Intake 1321.14 ml  Output 635 ml  Net 686.14 ml      Physical Exam    General:  Lying in bed  No resp difficulty HEENT: normal Neck: supple. JVP to ear  Carotids 2+ bilat; no bruits. No lymphadenopathy or thryomegaly appreciated. Cor: PMI nondisplaced. Regular rate & rhythm. +s3 Lungs: clear Abdomen: soft, nontender, nondistended. No hepatosplenomegaly. No bruits or masses. Good bowel sounds. Extremities: no cyanosis, clubbing, rash, edema Neuro: alert & orientedx3, cranial nerves grossly intact. moves all 4 extremities w/o difficulty. Affect pleasant  Telemetry   NSR 70-80s Personally reviewed    Labs    CBC Recent Labs    02/11/2020 1745  WBC 3.9*  NEUTROABS 2.2  HGB 9.4*  HCT 30.7*  MCV 94.8  PLT 102   Basic Metabolic Panel Recent Labs    01/31/2020 1745 02/03/20 0545 01/29/2020 0355  NA 139 139 136  K 3.9 3.4* 4.5  CL 104 103 101  CO2 25 23 23   GLUCOSE 106* 142* 120*  BUN 18 19 22   CREATININE 2.53* 2.42* 3.27*  CALCIUM 9.3 8.9  9.2  MG 2.0  --  2.1   Liver Function Tests Recent Labs    02/03/2020 1745  AST 15  ALT 11  ALKPHOS 85  BILITOT 0.8  PROT 7.7  ALBUMIN 3.9   No results for input(s): LIPASE, AMYLASE in the last 72 hours. Cardiac Enzymes No results for input(s): CKTOTAL, CKMB, CKMBINDEX, TROPONINI in the last 72 hours.  BNP: BNP (last 3 results) Recent Labs    06/23/19 1527 12/25/19 1757 02/13/2020 1745  BNP 613.2* 776.7* 1,014.9*    ProBNP (last 3 results) No results for input(s): PROBNP in the last 8760 hours.   D-Dimer No results for input(s): DDIMER in the last 72 hours. Hemoglobin A1C No results for input(s): HGBA1C in the last 72 hours. Fasting Lipid Panel No results for input(s): CHOL, HDL, LDLCALC, TRIG, CHOLHDL, LDLDIRECT in the last 72 hours. Thyroid Function Tests Recent Labs    02/03/20 0545  TSH 4.270    Other results:   Imaging    No results found.   Medications:     Scheduled Medications: . amiodarone  150 mg Intravenous Once  . apixaban  5 mg Oral BID  . atorvastatin  40 mg Oral QPM  . Chlorhexidine Gluconate Cloth  6 each Topical Daily  . furosemide  120 mg Intravenous BID  . gabapentin  300 mg Oral BID  . pantoprazole  40 mg Oral Daily  . sodium chloride flush  10-40 mL Intracatheter Q12H    Infusions: . amiodarone 30 mg/hr (02/12/2020 0600)  . DOBUTamine 3 mcg/kg/min (02/03/2020 0600)    PRN Medications: acetaminophen, ALPRAZolam, calcium carbonate, nitroGLYCERIN, ondansetron (ZOFRAN) IV, sodium chloride flush, zolpidem    Patient Profile   Glenn [name] is a 69 y.o.malewith a hx of chronic systolic HF 2/2 NICM s/pBSciICD,persistentatrial fibrillation, h/o stroke (Lg L MCA infarct w/ hemorrhagic conversionin 2018),HTN, HCV,tobacco abuse (smokes ~1/4 ppd), and chronic cholecystitiswith achronic perc drain.      Assessment/Plan   1. A/C Systolic Heart Failure -> cardiogenic shock - [facility] 10/2018 showed normal cors, elevated  filling pressures and low CI at 1.9 - TTE10/2020 LVEF 20%, mild RV dysfunction - On home dobutamine 3 mcg.  - CVP 20 - Add NE 5 and switch to lasix gtt at 20 - No bb/spiro/dig/arini with elevated creatinine.   2. Atrial fib, RVR - Most recent DC-CV 01/06/2020  - On amio gtt. Converted to NSR on 12/15  - continue Eliquis  3. AKI on CKD III - Creatinine baseline ~2.5 - up to 3.3 today suspect ATN/cardiorenal - titrate pressors/inotropes. Keep MAP close to 70 Co-ox 55% or greater.   -Follow renal function daily. .   4. H/O Ischemic R MCA , 2018  hx of large R MCA with hemorrhagic conversion in Sept 2020 & L MCA s/p thrombectomy M2 occlusion in 2018.  5.  Chronic cholecystitis with possible hepatic abscess - currently not surgical candidate - AF. WBC ok - [facility] has seen recently.   CRITICAL CARE Performed by: Glori Bickers  Total critical care time: 35 minutes  Critical care time was exclusive of separately billable procedures and treating other patients.  Critical care was necessary to treat or prevent imminent or life-threatening deterioration.  Critical care was time spent personally by me (independent of midlevel providers or residents) on the following activities: development of treatment plan with patient and/or surrogate as well as nursing, discussions with consultants, evaluation of patient's response to treatment, examination of patient, obtaining history from patient or surrogate, ordering and performing treatments and interventions, ordering and review of laboratory studies, ordering and review of radiographic studies, pulse oximetry and re-evaluation of patient's condition.     .  Length of Stay: 2  Glori Bickers, MD  01/29/2020, 9:25 AM  Advanced Heart Failure Team Pager [phone] (M-F; 7a - 4p)  Please contact [facility] for night-coverage after hours (4p -7a ) and weekends on [email or web address]

## 2020-02-05 LAB — BASIC METABOLIC PANEL
Anion gap: 11 (ref 5–15)
BUN: 24 mg/dL — ABNORMAL HIGH (ref 8–23)
CO2: 26 mmol/L (ref 22–32)
Calcium: 8.8 mg/dL — ABNORMAL LOW (ref 8.9–10.3)
Chloride: 94 mmol/L — ABNORMAL LOW (ref 98–111)
Creatinine, Ser: 3.3 mg/dL — ABNORMAL HIGH (ref 0.61–1.24)
GFR, Estimated: 20 mL/min — ABNORMAL LOW (ref >60)
Glucose, Bld: 221 mg/dL — ABNORMAL HIGH (ref 70–99)
Potassium: 3.9 mmol/L (ref 3.5–5.1)
Sodium: 131 mmol/L — ABNORMAL LOW (ref 135–145)

## 2020-02-05 LAB — COOXEMETRY PANEL
Carboxyhemoglobin: 1.1 % (ref 0.5–1.5)
Carboxyhemoglobin: 1.3 % (ref 0.5–1.5)
Methemoglobin: 0.7 % (ref 0.0–1.5)
Methemoglobin: 0.8 % (ref 0.0–1.5)
O2 Saturation: 44.4 %
O2 Saturation: 53.8 %
Total hemoglobin: 9.7 g/dL — ABNORMAL LOW (ref 12.0–16.0)
Total hemoglobin: 9.7 g/dL — ABNORMAL LOW (ref 12.0–16.0)

## 2020-02-05 LAB — LACTIC ACID, PLASMA: Lactic Acid, Venous: 1.3 mmol/L (ref 0.5–1.9)

## 2020-02-05 NOTE — Progress Notes (Addendum)
Advanced Heart Failure Rounding Note  PCP-Cardiologist: Virl Axe, MD   Subjective:    - 12/14 Admitted with a/c systolic heart failure and A fib RVR. On dobutamine 3 mcg chronically.  - 12/15 converted back to NSR on amio gtt - 12/16 Co-ox dropped to 24%. Lactic acid 2.4. DBA increased to 5 and NE increased to 8.     Co-ox dropped into the 20s last night. DBA and NE increased.   Remains on DBA 5 and NE 8. On lasix gtt at 30/hr. -1.4L in UOP yesterday. Wt down 3 lb. CVP 22  Co-ox 44% today. SCr 3.39>>3.30.  Na 131   Laying in bed, resting. Denies resting dyspnea. Maintaining NSR on tele.    Objective:   Weight Range: 77.1 kg Body mass index is 26.62 kg/m.   Vital Signs:   Temp:  [97.4 F (36.3 C)-97.7 F (36.5 C)] 97.6 F (36.4 C) (12/17 0730) Resp:  [10-35] 15 (12/17 0730) BP: (91-192)/(62-160) 110/62 (12/17 0730) SpO2:  [92 %-100 %] 100 % (12/17 0730) Weight:  [77.1 kg] 77.1 kg (12/17 0500) Last BM Date: 02/03/20  Weight change: Filed Weights   02/03/20 0100 02/03/2020 0500 02/05/20 0500  Weight: 75.8 kg 78.2 kg 77.1 kg    Intake/Output:   Intake/Output Summary (Last 24 hours) at 02/05/2020 0952 Last data filed at 02/05/2020 0800 Gross per 24 hour  Intake 1543.06 ml  Output 1660 ml  Net -116.94 ml      Physical Exam    CVP 22 General: fatigue appearing male, laying in bed. No resp difficulty HEENT: normal Neck: supple. JVP elevated to ear  Carotids 2+ bilat; no bruits. No lymphadenopathy or thryomegaly appreciated. Cor: PMI nondisplaced. Regular rate & rhythm. +s3 Lungs: clear, no wheezing  Abdomen: soft, nontender, nondistended. No hepatosplenomegaly. No bruits or masses. Good bowel sounds. Extremities: no cyanosis, clubbing, rash, edema Neuro: alert & orientedx3, cranial nerves grossly intact. moves all 4 extremities w/o difficulty. Affect pleasant  Telemetry   NSR 70-80s, no further afib/flutter Personally reviewed    Labs     CBC Recent Labs    01/28/2020 1745  WBC 3.9*  NEUTROABS 2.2  HGB 9.4*  HCT 30.7*  MCV 94.8  PLT 177   Basic Metabolic Panel Recent Labs    01/24/2020 1745 02/03/20 0545 02/18/2020 0355 02/01/2020 2048 02/05/20 0416  NA 139   < > 136 133* 131*  K 3.9   < > 4.5 4.1 3.9  CL 104   < > 101 98 94*  CO2 25   < > 23 23 26   GLUCOSE 106*   < > 120* 235* 221*  BUN 18   < > 22 24* 24*  CREATININE 2.53*   < > 3.27* 3.39* 3.30*  CALCIUM 9.3   < > 9.2 8.8* 8.8*  MG 2.0  --  2.1  --   --    < > = values in this interval not displayed.   Liver Function Tests Recent Labs    01/30/2020 1745  AST 15  ALT 11  ALKPHOS 85  BILITOT 0.8  PROT 7.7  ALBUMIN 3.9   No results for input(s): LIPASE, AMYLASE in the last 72 hours. Cardiac Enzymes No results for input(s): CKTOTAL, CKMB, CKMBINDEX, TROPONINI in the last 72 hours.  BNP: BNP (last 3 results) Recent Labs    06/23/19 1527 12/25/19 1757 02/13/2020 1745  BNP 613.2* 776.7* 1,014.9*    ProBNP (last 3 results) No results for input(s): PROBNP  in the last 8760 hours.   D-Dimer No results for input(s): DDIMER in the last 72 hours. Hemoglobin A1C No results for input(s): HGBA1C in the last 72 hours. Fasting Lipid Panel No results for input(s): CHOL, HDL, LDLCALC, TRIG, CHOLHDL, LDLDIRECT in the last 72 hours. Thyroid Function Tests Recent Labs    02/03/20 0545  TSH 4.270    Other results:   Imaging    No results found.   Medications:     Scheduled Medications: . amiodarone  150 mg Intravenous Once  . apixaban  5 mg Oral BID  . atorvastatin  40 mg Oral QPM  . Chlorhexidine Gluconate Cloth  6 each Topical Daily  . gabapentin  300 mg Oral BID  . pantoprazole  40 mg Oral Daily  . sodium chloride flush  10-40 mL Intracatheter Q12H    Infusions: . amiodarone 30 mg/hr (02/05/20 0700)  . DOBUTamine 5 mcg/kg/min (02/05/20 0700)  . furosemide (LASIX) 200 mg in dextrose 5% 100 mL (2mg /mL) infusion 30 mg/hr (02/05/20 0927)   . norepinephrine (LEVOPHED) Adult infusion 8 mcg/min (02/05/20 0700)    PRN Medications: acetaminophen, ALPRAZolam, calcium carbonate, nitroGLYCERIN, ondansetron (ZOFRAN) IV, sodium chloride flush, zolpidem    Patient Profile   [name] is a 69 y.o.malewith a hx of chronic systolic HF 2/2 NICM s/pBSciICD,persistentatrial fibrillation, h/o stroke (Lg L MCA infarct w/ hemorrhagic conversionin 2018),HTN, HCV,tobacco abuse (smokes ~1/4 ppd), and chronic cholecystitiswith achronic perc drain.      Assessment/Plan   1. A/C Systolic Heart Failure -> cardiogenic shock - [facility] 10/2018 showed normal cors, elevated filling pressures and low CI at 1.9 - TTE10/2020 LVEF 20%, mild RV dysfunction - On home dobutamine 3 mcg.  - Co-ox dropped to 24% on 12/16 and inotropes increased - on DBA 5 + NE 8. Co-ox 44% today. Will need mechanical support w/ IABP for persistent shock despite dual inotrope's   - CVP 22 - SCr remains elevated but stable. K 3.9 - Continue lasix gtt at 30/ hr. Consider dose of metolazone  - No bb/spiro/dig/arini with elevated creatinine.   2. Atrial fib, RVR - Most recent DC-CV 01/06/2020  - On amio gtt. Converted to NSR on 12/15. Maintaining NSR  - continue Eliquis  3. AKI on CKD III - Creatinine baseline ~2.5 - up to 3.3 today suspect ATN/cardiorenal - titrate pressors/inotropes. Keep MAP close to 70 Co-ox 55% or greater.   - Follow renal function daily.  4. H/O Ischemic R MCA , 2018  - hx of large R MCA with hemorrhagic conversion in Sept 2020 & L MCA s/p thrombectomy M2 occlusion in 2018.  5.  Chronic cholecystitis with possible hepatic abscess - currently not surgical candidate - AF. WBC nl on admit  - [facility] has seen recently.   6. Hyponatremia - Na 131 - hypervolemic hyponatremia - continue diuresis  .  Length of Stay: [address] PA-C  02/05/2020, 9:52 AM  Advanced Heart Failure Team Pager [phone] (M-F; 7a - 4p)   Please contact [facility] for night-coverage after hours (4p -7a ) and weekends on [email or web address]  Agree with above.   Decompensated overnight with profound biventricular shock and lactic acidosis. DBA and NE increased. Switched to lasix gtt.   Creatinine now seems to be plateauing. Co-ox still low but coming up. Denies SOB, ab pain, orthopnea or PND.   General:  Lying flat in bed. Weak appearing. No resp difficulty HEENT: normal Neck: supple. JVP to ear . Carotids 2+ bilat; no  bruits. No lymphadenopathy or thryomegaly appreciated. Cor: PMI nondisplaced. Regular rate & rhythm. +s3 Lungs: clear Abdomen: soft, nontender, nondistended. No hepatosplenomegaly. No bruits or masses. Good bowel sounds. Extremities: no cyanosis, clubbing, rash, 1+ edema cool Neuro: alert & orientedx3, cranial nerves grossly intact. moves all 4 extremities w/o difficulty. Affect pleasant  Much worse overnight requiring increase in pressors/intoropes. Now seems to be stabilizing some. Unfortunately no-long term options for durable support/transplant. Will follow co-ox and lactic acid closely. If getting worse could consider short-term IABP for stabilization (seems to have deteriorated a bit after chemical cardioversion of AF) versus switch to comfort care.   CRITICAL CARE Performed by: Glori Bickers  Total critical care time: 35 minutes  Critical care time was exclusive of separately billable procedures and treating other patients.  Critical care was necessary to treat or prevent imminent or life-threatening deterioration.  Critical care was time spent personally by me (independent of midlevel providers or residents) on the following activities: development of treatment plan with patient and/or surrogate as well as nursing, discussions with consultants, evaluation of patient's response to treatment, examination of patient, obtaining history from patient or surrogate, ordering and performing treatments and  interventions, ordering and review of laboratory studies, ordering and review of radiographic studies, pulse oximetry and re-evaluation of patient's condition.  Glori Bickers, MD  11:08 AM

## 2020-02-06 LAB — GLUCOSE, CAPILLARY
Glucose-Capillary: 154 mg/dL — ABNORMAL HIGH (ref 70–99)
Glucose-Capillary: 128 mg/dL — ABNORMAL HIGH (ref 70–99)

## 2020-02-06 LAB — BASIC METABOLIC PANEL
Anion gap: 13 (ref 5–15)
Anion gap: 15 (ref 5–15)
BUN: 25 mg/dL — ABNORMAL HIGH (ref 8–23)
BUN: 25 mg/dL — ABNORMAL HIGH (ref 8–23)
CO2: 29 mmol/L (ref 22–32)
CO2: 26 mmol/L (ref 22–32)
Calcium: 8.5 mg/dL — ABNORMAL LOW (ref 8.9–10.3)
Calcium: 8.5 mg/dL — ABNORMAL LOW (ref 8.9–10.3)
Chloride: 86 mmol/L — ABNORMAL LOW (ref 98–111)
Chloride: 84 mmol/L — ABNORMAL LOW (ref 98–111)
Creatinine, Ser: 3.03 mg/dL — ABNORMAL HIGH (ref 0.61–1.24)
Creatinine, Ser: 2.91 mg/dL — ABNORMAL HIGH (ref 0.61–1.24)
GFR, Estimated: 22 mL/min — ABNORMAL LOW (ref >60)
GFR, Estimated: 23 mL/min — ABNORMAL LOW (ref >60)
Glucose, Bld: 305 mg/dL — ABNORMAL HIGH (ref 70–99)
Glucose, Bld: 311 mg/dL — ABNORMAL HIGH (ref 70–99)
Potassium: 2.9 mmol/L — ABNORMAL LOW (ref 3.5–5.1)
Potassium: 3.2 mmol/L — ABNORMAL LOW (ref 3.5–5.1)
Sodium: 128 mmol/L — ABNORMAL LOW (ref 135–145)
Sodium: 125 mmol/L — ABNORMAL LOW (ref 135–145)

## 2020-02-06 LAB — MAGNESIUM: Magnesium: 1.7 mg/dL (ref 1.7–2.4)

## 2020-02-06 LAB — PROTIME-INR
INR: 2.1 — ABNORMAL HIGH (ref 0.8–1.2)
Prothrombin Time: 22.6 seconds — ABNORMAL HIGH (ref 11.4–15.2)

## 2020-02-06 LAB — COOXEMETRY PANEL
Carboxyhemoglobin: 1.4 % (ref 0.5–1.5)
Methemoglobin: 0.9 % (ref 0.0–1.5)
O2 Saturation: 51.2 %
Total hemoglobin: 9 g/dL — ABNORMAL LOW (ref 12.0–16.0)

## 2020-02-06 MED ORDER — POTASSIUM CHLORIDE CRYS ER 20 MEQ PO TBCR
40.0000 meq | EXTENDED_RELEASE_TABLET | Freq: Once | ORAL | Status: AC
  Administered 2020-02-06: 10:00:00 40 meq via ORAL
  Filled 2020-02-06: qty 2

## 2020-02-06 MED ORDER — POTASSIUM CHLORIDE CRYS ER 20 MEQ PO TBCR
40.0000 meq | EXTENDED_RELEASE_TABLET | Freq: Once | ORAL | Status: AC
  Administered 2020-02-06: 06:00:00 40 meq via ORAL
  Filled 2020-02-06: qty 2

## 2020-02-06 MED ORDER — POTASSIUM CHLORIDE CRYS ER 20 MEQ PO TBCR
40.0000 meq | EXTENDED_RELEASE_TABLET | Freq: Four times a day (QID) | ORAL | Status: AC
  Administered 2020-02-06 (×2): 40 meq via ORAL
  Filled 2020-02-06 (×2): qty 2

## 2020-02-06 MED ORDER — MAGNESIUM SULFATE 2 GM/50ML IV SOLN
2.0000 g | Freq: Once | INTRAVENOUS | Status: AC
  Administered 2020-02-06: 08:00:00 2 g via INTRAVENOUS
  Filled 2020-02-06: qty 50

## 2020-02-06 MED ORDER — INSULIN ASPART 100 UNIT/ML ~~LOC~~ SOLN
0.0000 [IU] | Freq: Three times a day (TID) | SUBCUTANEOUS | Status: DC
  Administered 2020-02-06: 17:00:00 2 [IU] via SUBCUTANEOUS
  Administered 2020-02-07: 16:00:00 1 [IU] via SUBCUTANEOUS
  Administered 2020-02-07: 07:00:00 2 [IU] via SUBCUTANEOUS
  Administered 2020-02-08: 16:00:00 1 [IU] via SUBCUTANEOUS
  Administered 2020-02-08: 08:00:00 3 [IU] via SUBCUTANEOUS
  Administered 2020-02-10 (×3): 1 [IU] via SUBCUTANEOUS
  Administered 2020-02-11: 18:00:00 2 [IU] via SUBCUTANEOUS
  Administered 2020-02-12 – 2020-02-13 (×4): 1 [IU] via SUBCUTANEOUS
  Administered 2020-02-13: 17:00:00 2 [IU] via SUBCUTANEOUS
  Administered 2020-02-13: 13:00:00 1 [IU] via SUBCUTANEOUS

## 2020-02-06 NOTE — Progress Notes (Signed)
Advanced Heart Failure Rounding Note  PCP-Cardiologist: Virl Axe, MD   Subjective:    - 12/14 Admitted with a/c systolic heart failure and A fib RVR. On dobutamine 3 mcg chronically.  - 12/15 converted back to NSR on amio gtt - 12/16 Co-ox dropped to 24%. Lactic acid 2.4. DBA increased to 5 and NE increased to 8.    On NE 8 and DBA 5  Co-ox 51% today  Remains on IV lasix at 30. Weight down 1 pound  CVP 22 -> 13  SCr 3.39>>3.30. > 3.0 Na 131 -> 128 K 2.9   Feels much better. Remains in NSR on IV amio. Denies dyspnea, orthopnea or PND.    Objective:   Weight Range: 76.5 kg Body mass index is 26.41 kg/m.   Vital Signs:   Temp:  [97.2 F (36.2 C)-98.2 F (36.8 C)] 98.2 F (36.8 C) (12/18 0451) Pulse Rate:  [72] 72 (12/17 1200) Resp:  [15-22] 15 (12/18 0600) BP: (101-152)/(61-88) 117/81 (12/18 0600) SpO2:  [90 %-100 %] 100 % (12/18 0600) Weight:  [76.5 kg] 76.5 kg (12/18 0500) Last BM Date: 02/03/20  Weight change: Filed Weights   02/12/2020 0500 02/05/20 0500 02/06/20 0500  Weight: 78.2 kg 77.1 kg 76.5 kg    Intake/Output:   Intake/Output Summary (Last 24 hours) at 02/06/2020 5732 Last data filed at 02/06/2020 0700 Gross per 24 hour  Intake 2155.22 ml  Output 3080 ml  Net -924.78 ml      Physical Exam    CVP 13 General: fatigue appearing male, laying in bed. No resp difficulty HEENT: normal Neck: supple. JVP 13. Carotids 2+ bilat; no bruits. No lymphadenopathy or thryomegaly appreciated. Cor: PMI nondisplaced. Regular rate & rhythm. +s3 Lungs: clear Abdomen: soft, nontender, nondistended. No hepatosplenomegaly. No bruits or masses. Good bowel sounds. Extremities: no cyanosis, clubbing, rash, 1+ edema Neuro: alert & orientedx3, cranial nerves grossly intact. moves all 4 extremities w/o difficulty. Affect pleasant   Telemetry   NSR 70s, no further afib/flutter Personally reviewed    Labs    CBC No results for input(s): WBC, NEUTROABS, HGB,  HCT, MCV, PLT in the last 72 hours. Basic Metabolic Panel Recent Labs    02/09/2020 0355 01/22/2020 2048 02/05/20 0416 02/06/20 0357  NA 136   < > 131* 128*  K 4.5   < > 3.9 2.9*  CL 101   < > 94* 86*  CO2 23   < > 26 29  GLUCOSE 120*   < > 221* 305*  BUN 22   < > 24* 25*  CREATININE 3.27*   < > 3.30* 3.03*  CALCIUM 9.2   < > 8.8* 8.5*  MG 2.1  --   --  1.7   < > = values in this interval not displayed.   Liver Function Tests No results for input(s): AST, ALT, ALKPHOS, BILITOT, PROT, ALBUMIN in the last 72 hours. No results for input(s): LIPASE, AMYLASE in the last 72 hours. Cardiac Enzymes No results for input(s): CKTOTAL, CKMB, CKMBINDEX, TROPONINI in the last 72 hours.  BNP: BNP (last 3 results) Recent Labs    06/23/19 1527 12/25/19 1757 02/11/2020 1745  BNP 613.2* 776.7* 1,014.9*    ProBNP (last 3 results) No results for input(s): PROBNP in the last 8760 hours.   D-Dimer No results for input(s): DDIMER in the last 72 hours. Hemoglobin A1C No results for input(s): HGBA1C in the last 72 hours. Fasting Lipid Panel No results for input(s): CHOL, HDL,  LDLCALC, TRIG, CHOLHDL, LDLDIRECT in the last 72 hours. Thyroid Function Tests No results for input(s): TSH, T4TOTAL, T3FREE, THYROIDAB in the last 72 hours.  Invalid input(s): FREET3  Other results:   Imaging    No results found.   Medications:     Scheduled Medications: . amiodarone  150 mg Intravenous Once  . apixaban  5 mg Oral BID  . atorvastatin  40 mg Oral QPM  . Chlorhexidine Gluconate Cloth  6 each Topical Daily  . gabapentin  300 mg Oral BID  . pantoprazole  40 mg Oral Daily  . sodium chloride flush  10-40 mL Intracatheter Q12H    Infusions: . amiodarone 30 mg/hr (02/06/20 0700)  . DOBUTamine 5 mcg/kg/min (02/06/20 0700)  . furosemide (LASIX) 200 mg in dextrose 5% 100 mL (2mg /mL) infusion 30 mg/hr (02/06/20 0700)  . magnesium sulfate bolus IVPB 2 g (02/06/20 0807)  . norepinephrine  (LEVOPHED) Adult infusion 8 mcg/min (02/06/20 0700)    PRN Medications: acetaminophen, ALPRAZolam, calcium carbonate, nitroGLYCERIN, ondansetron (ZOFRAN) IV, sodium chloride flush, zolpidem    Patient Profile   Glenn [name] is a 69 y.o.malewith a hx of chronic systolic HF 2/2 NICM s/pBSciICD,persistentatrial fibrillation, h/o stroke (Lg L MCA infarct w/ hemorrhagic conversionin 2018),HTN, HCV,tobacco abuse (smokes ~1/4 ppd), and chronic cholecystitiswith achronic perc drain.      Assessment/Plan   1. A/C Systolic Heart Failure -> cardiogenic shock - [facility] 10/2018 showed normal cors, elevated filling pressures and low CI at 1.9 - TTE10/2020 LVEF 20%, mild RV dysfunction - On home dobutamine 3 mcg.  - Co-ox dropped to 24% on 12/16 and inotropes increased - on DBA 5 + NE 8. Co-ox 51% today  - CVP 13. Continue IV diuresis one more day  - SCr elevated but starting to improve K 2.9 - Continue lasix gtt at 30/ hr. Consider dose of metolazone  - No bb/spiro/dig/arini with elevated creatinine.   2. Atrial fib, RVR - Most recent DC-CV 01/06/2020  - On amio gtt. Converted to NSR on 12/15. Maintaining NSR  - continue Eliquis  3. AKI on CKD III - Creatinine baseline ~2.5 - Improved today  3.3 -> 3.0 suspect ATN/cardiorenal - titrate pressors/inotropes. Keep MAP close to 70 Co-ox 55% or greater.   - Follow renal function daily  4. H/O Ischemic R MCA , 2018  - hx of large R MCA with hemorrhagic conversion in Sept 2020 & L MCA s/p thrombectomy M2 occlusion in 2018.  5.  Chronic cholecystitis with possible hepatic abscess - currently not surgical candidate - AF. WBC nl on admit  - [facility] has seen recently.   6. Hyponatremia - Na 128 - hypervolemic hyponatremia - continue diuresis. Avoid free water  CRITICAL CARE Performed by: Glori Bickers  Total critical care time: 45 minutes  Critical care time was exclusive of separately billable procedures and treating  other patients.  Critical care was necessary to treat or prevent imminent or life-threatening deterioration.  Critical care was time spent personally by me (independent of midlevel providers or residents) on the following activities: development of treatment plan with patient and/or surrogate as well as nursing, discussions with consultants, evaluation of patient's response to treatment, examination of patient, obtaining history from patient or surrogate, ordering and performing treatments and interventions, ordering and review of laboratory studies, ordering and review of radiographic studies, pulse oximetry and re-evaluation of patient's condition.   .  Length of Stay: [name], MD  02/06/2020, 8:22 AM  Advanced Heart Failure  Team Pager [phone] (M-F; [place])  Please contact [facility] for night-coverage after hours (4p -7a ) and weekends on [email or web address]

## 2020-02-07 LAB — COOXEMETRY PANEL
Carboxyhemoglobin: 1.4 % (ref 0.5–1.5)
Methemoglobin: 0.5 % (ref 0.0–1.5)
O2 Saturation: 58 %
Total hemoglobin: 9.2 g/dL — ABNORMAL LOW (ref 12.0–16.0)

## 2020-02-07 LAB — GLUCOSE, CAPILLARY
Glucose-Capillary: 165 mg/dL — ABNORMAL HIGH (ref 70–99)
Glucose-Capillary: 107 mg/dL — ABNORMAL HIGH (ref 70–99)
Glucose-Capillary: 148 mg/dL — ABNORMAL HIGH (ref 70–99)
Glucose-Capillary: 274 mg/dL — ABNORMAL HIGH (ref 70–99)

## 2020-02-07 LAB — BASIC METABOLIC PANEL
Anion gap: 14 (ref 5–15)
BUN: 24 mg/dL — ABNORMAL HIGH (ref 8–23)
CO2: 29 mmol/L (ref 22–32)
Calcium: 8.6 mg/dL — ABNORMAL LOW (ref 8.9–10.3)
Chloride: 85 mmol/L — ABNORMAL LOW (ref 98–111)
Creatinine, Ser: 2.82 mg/dL — ABNORMAL HIGH (ref 0.61–1.24)
GFR, Estimated: 24 mL/min — ABNORMAL LOW (ref >60)
Glucose, Bld: 299 mg/dL — ABNORMAL HIGH (ref 70–99)
Potassium: 3.3 mmol/L — ABNORMAL LOW (ref 3.5–5.1)
Sodium: 128 mmol/L — ABNORMAL LOW (ref 135–145)

## 2020-02-07 LAB — MAGNESIUM: Magnesium: 2.1 mg/dL (ref 1.7–2.4)

## 2020-02-07 MED ORDER — POTASSIUM CHLORIDE CRYS ER 20 MEQ PO TBCR
40.0000 meq | EXTENDED_RELEASE_TABLET | ORAL | Status: AC
  Administered 2020-02-07 (×2): 40 meq via ORAL
  Filled 2020-02-07 (×2): qty 2

## 2020-02-07 MED ORDER — POTASSIUM CHLORIDE CRYS ER 20 MEQ PO TBCR
40.0000 meq | EXTENDED_RELEASE_TABLET | ORAL | Status: DC
  Administered 2020-02-07: 08:00:00 40 meq via ORAL
  Filled 2020-02-07: qty 2

## 2020-02-07 MED ORDER — ALBUTEROL SULFATE (2.5 MG/3ML) 0.083% IN NEBU
2.5000 mg | INHALATION_SOLUTION | Freq: Four times a day (QID) | RESPIRATORY_TRACT | Status: DC | PRN
  Administered 2020-02-07 – 2020-02-10 (×2): 2.5 mg via RESPIRATORY_TRACT
  Filled 2020-02-07 (×2): qty 3

## 2020-02-07 NOTE — Progress Notes (Signed)
Advanced Heart Failure Rounding Note  PCP-Cardiologist: Virl Axe, MD   Subjective:    - 12/14 Admitted with a/c systolic heart failure and A fib RVR. On dobutamine 3 mcg chronically.  - 12/15 converted back to NSR on amio gtt - 12/16 Co-ox dropped to 24%. Lactic acid 2.4. DBA increased to 5 and NE increased to 8.    On NE 8 and DBA 5  Co-ox 51% -> 58% today  Remains on IV lasix at 30. Weight unchanged  SCr 3.39>>3.30. > 3.0 > 2.8  CVP 14  Feels ok. Denies CP, orthopnea, PND or SOB. No edema.   Went back into AF last night at 1am   Objective:   Weight Range: 76.4 kg Body mass index is 26.38 kg/m.   Vital Signs:   Temp:  [97.8 F (36.6 C)-98.6 F (37 C)] 97.8 F (36.6 C) (12/19 0400) Resp:  [13-26] 13 (12/19 0600) BP: (99-145)/(54-85) 118/72 (12/19 0600) SpO2:  [90 %-100 %] 98 % (12/19 0600) Weight:  [76.4 kg] 76.4 kg (12/19 0450) Last BM Date: 02/03/20  Weight change: Filed Weights   02/05/20 0500 02/06/20 0500 02/07/20 0450  Weight: 77.1 kg 76.5 kg 76.4 kg    Intake/Output:   Intake/Output Summary (Last 24 hours) at 02/07/2020 0811 Last data filed at 02/07/2020 0600 Gross per 24 hour  Intake 1852.51 ml  Output 4200 ml  Net -2347.49 ml      Physical Exam    General:  Lying in bed. No resp difficulty HEENT: normal Neck: supple. JVP to ear  Carotids 2+ bilat; no bruits. No lymphadenopathy or thryomegaly appreciated. Cor: PMI nondisplaced. Irregular rate & rhythm. + s3 Lungs: clear Abdomen: soft, nontender, nondistended. No hepatosplenomegaly. No bruits or masses. Good bowel sounds. Extremities: no cyanosis, clubbing, rash, edema Neuro: alert & orientedx3, cranial nerves grossly intact. moves all 4 extremities w/o difficulty. Affect pleasant   Telemetry   AF 80s  (converted from sinus at 1:06a)Personally reviewed   Labs    CBC No results for input(s): WBC, NEUTROABS, HGB, HCT, MCV, PLT in the last 72 hours. Basic Metabolic Panel Recent  Labs    02/06/20 0357 02/06/20 1208 02/07/20 0352  NA 128* 125* 128*  K 2.9* 3.2* 3.3*  CL 86* 84* 85*  CO2 29 26 29   GLUCOSE 305* 311* 299*  BUN 25* 25* 24*  CREATININE 3.03* 2.91* 2.82*  CALCIUM 8.5* 8.5* 8.6*  MG 1.7  --  2.1   Liver Function Tests No results for input(s): AST, ALT, ALKPHOS, BILITOT, PROT, ALBUMIN in the last 72 hours. No results for input(s): LIPASE, AMYLASE in the last 72 hours. Cardiac Enzymes No results for input(s): CKTOTAL, CKMB, CKMBINDEX, TROPONINI in the last 72 hours.  BNP: BNP (last 3 results) Recent Labs    06/23/19 1527 12/25/19 1757 01/26/2020 1745  BNP 613.2* 776.7* 1,014.9*    ProBNP (last 3 results) No results for input(s): PROBNP in the last 8760 hours.   D-Dimer No results for input(s): DDIMER in the last 72 hours. Hemoglobin A1C No results for input(s): HGBA1C in the last 72 hours. Fasting Lipid Panel No results for input(s): CHOL, HDL, LDLCALC, TRIG, CHOLHDL, LDLDIRECT in the last 72 hours. Thyroid Function Tests No results for input(s): TSH, T4TOTAL, T3FREE, THYROIDAB in the last 72 hours.  Invalid input(s): FREET3  Other results:   Imaging    No results found.   Medications:     Scheduled Medications:  amiodarone  150 mg Intravenous Once  apixaban  5 mg Oral BID   atorvastatin  40 mg Oral QPM   Chlorhexidine Gluconate Cloth  6 each Topical Daily   gabapentin  300 mg Oral BID   insulin aspart  0-9 Units Subcutaneous TID WC   pantoprazole  40 mg Oral Daily   potassium chloride  40 mEq Oral Q4H   sodium chloride flush  10-40 mL Intracatheter Q12H    Infusions:  amiodarone 30 mg/hr (02/07/20 0746)   DOBUTamine 5 mcg/kg/min (02/07/20 0600)   furosemide (LASIX) 200 mg in dextrose 5% 100 mL (2mg /mL) infusion 30 mg/hr (02/07/20 0600)   norepinephrine (LEVOPHED) Adult infusion 8 mcg/min (02/07/20 0744)    PRN Medications: acetaminophen, ALPRAZolam, calcium carbonate, nitroGLYCERIN, ondansetron  (ZOFRAN) IV, sodium chloride flush, zolpidem    Patient Profile   Mr Mcphee is a 69 y.o.malewith a hx of chronic systolic HF 2/2 NICM s/pBSciICD,persistentatrial fibrillation, h/o stroke (Lg L MCA infarct w/ hemorrhagic conversionin 2018),HTN, HCV,tobacco abuse (smokes ~1/4 ppd), and chronic cholecystitiswith achronic perc drain.      Assessment/Plan   1. A/C Systolic Heart Failure -> cardiogenic shock - [facility] 10/2018 showed normal cors, elevated filling pressures and low CI at 1.9 - TTE10/2020 LVEF 20%, mild RV dysfunction - On home dobutamine 3 mcg.  - Co-ox dropped to 24% on 12/16 and inotropes increased - on DBA 5 + NE 8. Co-ox 58% today. Will not begin wean yet. Possible tomorrow (await creatinine nadir) - CVP 14. Continue IV diuresis - SCr elevated but continues to improve. K 3.3. Will supp  - No bb/spiro/dig/arini with elevated creatinine.   2. Atrial fib, RVR - Most recent DC-CV 01/06/2020  - On amio gtt. Converted to NSR on 12/15.  - Back in AF overnight. Will bolus IV amio. Continue IV amio while on pressors/inotropes. - Not candidate for AVN ablation and CRT with chronic liver infection - continue Eliquis. No bleeding  3. AKI on CKD III - Creatinine baseline ~2.5 - Improved today  3.3 -> 3.0  -> 2.8 suspect ATN/cardiorenal - Continue pressors/inotropes. Goal is to keep MAP close to 70 Co-ox 55% or greater.   - Follow renal function daily  4. H/O Ischemic R MCA , 2018  - hx of large R MCA with hemorrhagic conversion in Sept 2020 & L MCA s/p thrombectomy M2 occlusion in 2018.  5.  Chronic cholecystitis with possible hepatic abscess - currently not surgical candidate - AF. WBC nl on admit  - [facility] has seen recently.   6. Hyponatremia - Na 128 (stable) - hypervolemic hyponatremia - continue diuresis. Avoid free water  7. DM2 - glucose running high.  - on SSI - will get DM2 consult  CRITICAL CARE Performed by: Glori Bickers  Total  critical care time: 35 minutes  Critical care time was exclusive of separately billable procedures and treating other patients.  Critical care was necessary to treat or prevent imminent or life-threatening deterioration.  Critical care was time spent personally by me (independent of midlevel providers or residents) on the following activities: development of treatment plan with patient and/or surrogate as well as nursing, discussions with consultants, evaluation of patient's response to treatment, examination of patient, obtaining history from patient or surrogate, ordering and performing treatments and interventions, ordering and review of laboratory studies, ordering and review of radiographic studies, pulse oximetry and re-evaluation of patient's condition.   .  Length of Stay: [place], MD  02/07/2020, 8:11 AM  Advanced Heart Failure Team Pager [phone] (M-F;  7a - 4p)  Please contact [facility] for night-coverage after hours (4p -7a ) and weekends on [email or web address]

## 2020-02-08 DIAGNOSIS — R570 Cardiogenic shock: Secondary | ICD-10-CM

## 2020-02-08 LAB — BASIC METABOLIC PANEL
Anion gap: 11 (ref 5–15)
Anion gap: 13 (ref 5–15)
BUN: 26 mg/dL — ABNORMAL HIGH (ref 8–23)
BUN: 26 mg/dL — ABNORMAL HIGH (ref 8–23)
CO2: 29 mmol/L (ref 22–32)
CO2: 28 mmol/L (ref 22–32)
Calcium: 8.6 mg/dL — ABNORMAL LOW (ref 8.9–10.3)
Calcium: 7.8 mg/dL — ABNORMAL LOW (ref 8.9–10.3)
Chloride: 87 mmol/L — ABNORMAL LOW (ref 98–111)
Chloride: 87 mmol/L — ABNORMAL LOW (ref 98–111)
Creatinine, Ser: 2.73 mg/dL — ABNORMAL HIGH (ref 0.61–1.24)
Creatinine, Ser: 2.61 mg/dL — ABNORMAL HIGH (ref 0.61–1.24)
GFR, Estimated: 25 mL/min — ABNORMAL LOW (ref >60)
GFR, Estimated: 26 mL/min — ABNORMAL LOW (ref >60)
Glucose, Bld: 263 mg/dL — ABNORMAL HIGH (ref 70–99)
Glucose, Bld: 312 mg/dL — ABNORMAL HIGH (ref 70–99)
Potassium: 4.1 mmol/L (ref 3.5–5.1)
Potassium: 3 mmol/L — ABNORMAL LOW (ref 3.5–5.1)
Sodium: 127 mmol/L — ABNORMAL LOW (ref 135–145)
Sodium: 128 mmol/L — ABNORMAL LOW (ref 135–145)

## 2020-02-08 LAB — HEMOGLOBIN A1C
Hgb A1c MFr Bld: 4.8 % (ref 4.8–5.6)
Mean Plasma Glucose: 91.06 mg/dL

## 2020-02-08 LAB — CBC
HCT: 27.3 % — ABNORMAL LOW (ref 39.0–52.0)
Hemoglobin: 8.8 g/dL — ABNORMAL LOW (ref 13.0–17.0)
MCH: 28.8 pg (ref 26.0–34.0)
MCHC: 32.2 g/dL (ref 30.0–36.0)
MCV: 89.2 fL (ref 80.0–100.0)
Platelets: 233 10*3/uL (ref 150–400)
RBC: 3.06 MIL/uL — ABNORMAL LOW (ref 4.22–5.81)
RDW: 16.2 % — ABNORMAL HIGH (ref 11.5–15.5)
WBC: 6 10*3/uL (ref 4.0–10.5)
nRBC: 0 % (ref 0.0–0.2)

## 2020-02-08 LAB — GLUCOSE, CAPILLARY
Glucose-Capillary: 243 mg/dL — ABNORMAL HIGH (ref 70–99)
Glucose-Capillary: 112 mg/dL — ABNORMAL HIGH (ref 70–99)
Glucose-Capillary: 146 mg/dL — ABNORMAL HIGH (ref 70–99)
Glucose-Capillary: 231 mg/dL — ABNORMAL HIGH (ref 70–99)

## 2020-02-08 LAB — COOXEMETRY PANEL
Carboxyhemoglobin: 1.2 % (ref 0.5–1.5)
Carboxyhemoglobin: 1.8 % — ABNORMAL HIGH (ref 0.5–1.5)
Methemoglobin: 0.6 % (ref 0.0–1.5)
Methemoglobin: 0.9 % (ref 0.0–1.5)
O2 Saturation: 42.7 %
O2 Saturation: 68.2 %
Total hemoglobin: 9.2 g/dL — ABNORMAL LOW (ref 12.0–16.0)
Total hemoglobin: 8.9 g/dL — ABNORMAL LOW (ref 12.0–16.0)

## 2020-02-08 MED ORDER — AMIODARONE IV BOLUS ONLY 150 MG/100ML
150.0000 mg | Freq: Once | INTRAVENOUS | Status: AC
  Administered 2020-02-08: 16:00:00 150 mg via INTRAVENOUS

## 2020-02-08 MED ORDER — POTASSIUM CHLORIDE CRYS ER 20 MEQ PO TBCR
20.0000 meq | EXTENDED_RELEASE_TABLET | Freq: Once | ORAL | Status: AC
  Administered 2020-02-08: 22:00:00 20 meq via ORAL
  Filled 2020-02-08: qty 1

## 2020-02-08 MED ORDER — POTASSIUM CHLORIDE CRYS ER 20 MEQ PO TBCR
40.0000 meq | EXTENDED_RELEASE_TABLET | ORAL | Status: AC
  Administered 2020-02-08 – 2020-02-09 (×3): 40 meq via ORAL
  Filled 2020-02-08 (×3): qty 2

## 2020-02-08 NOTE — Progress Notes (Signed)
Inpatient Diabetes Program Recommendations  AACE/ADA: New Consensus Statement on Inpatient Glycemic Control (2015)  Target Ranges:  Prepandial:   less than 140 mg/dL      Peak postprandial:   less than 180 mg/dL (1-2 hours)      Critically ill patients:  140 - 180 mg/dL   Lab Results  Component Value Date   GLUCAP 243 (H) 02/08/2020   HGBA1C 4.6 (L) 12/26/2019    Review of Glycemic Control Results for Glenn [name], Glenn [name] (MRN [id number]) as of 02/08/2020 11:00  Ref. Range 02/07/2020 11:44 02/07/2020 15:54 02/07/2020 21:26 02/08/2020 06:47  Glucose-Capillary Latest Ref Range: 70 - 99 mg/dL 107 (H) 148 (H) 274 (H) 243 (H)   Diabetes history: DM 2 Outpatient Diabetes medications:  None Current orders for Inpatient glycemic control:  Novolog sensitive tid with meals Inpatient Diabetes Program Recommendations:   Unclear why blood sugars are increased today.  A1C=4.6%.   Will follow blood sugars.  May need low dose basal insulin but would wait until 02/09/20.   Thanks Adah Perl, RN, BC-ADM Inpatient Diabetes Coordinator Pager [phone] (8a-5p)

## 2020-02-08 NOTE — Progress Notes (Addendum)
Advanced Heart Failure Rounding Note  PCP-Cardiologist: Virl Axe, MD   Subjective:    - 12/14 Admitted with a/c systolic heart failure and A fib RVR. On dobutamine 3 mcg chronically.  - 12/15 converted back to NSR on amio gtt - 12/16 Co-ox dropped to 24%. Lactic acid 2.4. DBA increased to 5 and NE increased to 8.    On NE 8 and DBA 5  Co-ox 43% today (58% yesterday). Repeat co-ox pending.   Remains on IV lasix at 30. -4.5L in UOP yesterday. CVP 14   SCr down-trending 3.39>>3.30. > 3.0 > 2.8>2.7   Remains in Afib, V-rates 90s-low 100s.   He feels well. Ambulated the unit this am w/o dyspnea. Ate breakfast. Appetite good.    Objective:   Weight Range: 78.6 kg Body mass index is 27.14 kg/m.   Vital Signs:   Temp:  [96.1 F (35.6 C)-98.6 F (37 C)] 97.6 F (36.4 C) (12/20 0400) Resp:  [14-28] 23 (12/20 0600) BP: (89-144)/(63-113) 120/77 (12/20 0600) SpO2:  [95 %-100 %] 100 % (12/20 0600) Weight:  [78.6 kg] 78.6 kg (12/20 0500) Last BM Date: 02/05/20  Weight change: Filed Weights   02/06/20 0500 02/07/20 0450 02/08/20 0500  Weight: 76.5 kg 76.4 kg 78.6 kg    Intake/Output:   Intake/Output Summary (Last 24 hours) at 02/08/2020 0715 Last data filed at 02/08/2020 0600 Gross per 24 hour  Intake 1318.47 ml  Output 4450 ml  Net -3131.53 ml      Physical Exam   CVP 13-14 General:  Well appearing. No respiratory difficulty HEENT: normal Neck: supple. JVD 12-13 cm. Carotids 2+ bilat; no bruits. No lymphadenopathy or thyromegaly appreciated. Cor: PMI nondisplaced. Irregularly irregular rhythm and rate. No rubs, gallops or murmurs. Lungs: clear Abdomen: soft, nontender, nondistended. No hepatosplenomegaly. No bruits or masses. Good bowel sounds. Extremities: no cyanosis, clubbing, rash, trace bilateral LEE, + RUE PICC  Neuro: alert & oriented x 3, cranial nerves grossly intact. moves all 4 extremities w/o difficulty. Affect pleasant.   Telemetry   AF  90s-100s Personally reviewed   Labs    CBC Recent Labs    02/08/20 0521  WBC 6.0  HGB 8.8*  HCT 27.3*  MCV 89.2  PLT 161   Basic Metabolic Panel Recent Labs    02/06/20 0357 02/06/20 1208 02/07/20 0352 02/08/20 0521  NA 128*   < > 128* 127*  K 2.9*   < > 3.3* 4.1  CL 86*   < > 85* 87*  CO2 29   < > 29 29  GLUCOSE 305*   < > 299* 263*  BUN 25*   < > 24* 26*  CREATININE 3.03*   < > 2.82* 2.73*  CALCIUM 8.5*   < > 8.6* 8.6*  MG 1.7  --  2.1  --    < > = values in this interval not displayed.   Liver Function Tests No results for input(s): AST, ALT, ALKPHOS, BILITOT, PROT, ALBUMIN in the last 72 hours. No results for input(s): LIPASE, AMYLASE in the last 72 hours. Cardiac Enzymes No results for input(s): CKTOTAL, CKMB, CKMBINDEX, TROPONINI in the last 72 hours.  BNP: BNP (last 3 results) Recent Labs    06/23/19 1527 12/25/19 1757 01/27/2020 1745  BNP 613.2* 776.7* 1,014.9*    ProBNP (last 3 results) No results for input(s): PROBNP in the last 8760 hours.   D-Dimer No results for input(s): DDIMER in the last 72 hours. Hemoglobin A1C No results for  input(s): HGBA1C in the last 72 hours. Fasting Lipid Panel No results for input(s): CHOL, HDL, LDLCALC, TRIG, CHOLHDL, LDLDIRECT in the last 72 hours. Thyroid Function Tests No results for input(s): TSH, T4TOTAL, T3FREE, THYROIDAB in the last 72 hours.  Invalid input(s): FREET3  Other results:   Imaging    No results found.   Medications:     Scheduled Medications: . amiodarone  150 mg Intravenous Once  . apixaban  5 mg Oral BID  . atorvastatin  40 mg Oral QPM  . Chlorhexidine Gluconate Cloth  6 each Topical Daily  . gabapentin  300 mg Oral BID  . insulin aspart  0-9 Units Subcutaneous TID WC  . pantoprazole  40 mg Oral Daily  . sodium chloride flush  10-40 mL Intracatheter Q12H    Infusions: . amiodarone 30 mg/hr (02/08/20 0216)  . DOBUTamine 5 mcg/kg/min (02/07/20 1800)  . furosemide  (LASIX) 200 mg in dextrose 5% 100 mL (2mg /mL) infusion 30 mg/hr (02/08/20 0501)  . norepinephrine (LEVOPHED) Adult infusion 8 mcg/min (02/08/20 0013)    PRN Medications: acetaminophen, albuterol, ALPRAZolam, calcium carbonate, nitroGLYCERIN, ondansetron (ZOFRAN) IV, sodium chloride flush, zolpidem    Patient Profile   Glenn [name] is a 69 y.o.malewith a hx of chronic systolic HF 2/2 NICM s/pBSciICD,persistentatrial fibrillation, h/o stroke (Lg L MCA infarct w/ hemorrhagic conversionin 2018),HTN, HCV,tobacco abuse (smokes ~1/4 ppd), and chronic cholecystitiswith achronic perc drain.      Assessment/Plan   1. A/C Systolic Heart Failure -> cardiogenic shock - [facility] 10/2018 showed normal cors, elevated filling pressures and low CI at 1.9 - TTE10/2020 LVEF 20%, mild RV dysfunction - On home dobutamine 3 mcg.  - Co-ox dropped to 24% on 12/16 and inotropes increased - on DBA 5 + NE 8. Co-ox 43 % today. Repeat co-ox pending.  - continue inotropes at current rate. ? Need for IABP  - CVP 14. Continue IV diuresis - SCr elevated but continues to improve. K 4.1 - No bb/spiro/dig/arini with elevated creatinine.   2. Atrial fib, RVR - Most recent DC-CV 01/06/2020  - On amio gtt. Converted to NSR on 12/15.  - now back in Afib, 90s- low 100s. Continue IV amio while on pressors/inotropes. - Not candidate for AVN ablation and CRT with chronic liver infection - continue Eliquis. No bleeding  3. AKI on CKD III - Creatinine baseline ~2.5 - Improved today  3.3 -> 3.0  -> 2.8->2.7 suspect ATN/cardiorenal - Continue pressors/inotropes. Goal is to keep MAP close to 70 Co-ox 55% or greater.   - Follow renal function daily  4. H/O Ischemic R MCA , 2018  - hx of large R MCA with hemorrhagic conversion in Sept 2020 & L MCA s/p thrombectomy M2 occlusion in 2018.  5. Chronic cholecystitis with possible hepatic abscess - currently not surgical candidate - AF. WBC nl on admit  - [facility] has  seen recently.   6. Hyponatremia - Na 127 (stable) - hypervolemic hyponatremia - continue diuresis. Avoid free water  7. DM2 - glucose running high.  - on SSI - will get DM2 consult   Length of Stay: [address] Glenn [name]  02/08/2020, 7:15 AM  Advanced Heart Failure Team Pager [phone] (M-F; 7a - 4p)  Please contact [facility] for night-coverage after hours (4p -7a ) and weekends on [email or web address]   Agree with above.   Remains on DBA and norepi as well as lasix gtt and IV amio. Diuresed well yesterday. CVP stil elevated. Initial co-ox this am  was low but repeat ok. Breathing better. Remains in AF 90-100s.   General:  Lying in bed  No resp difficulty HEENT: normal Neck: supple. JVP to jaw  Carotids 2+ bilat; no bruits. No lymphadenopathy or thryomegaly appreciated. Cor: PMI nondisplaced. Irregular rate & rhythm.+s3 Lungs: clear Abdomen: soft, mildly tender RUQ , nondistended. No hepatosplenomegaly. No bruits or masses. Good bowel sounds. Extremities: no cyanosis, clubbing, rash, edema Neuro: alert & orientedx3, cranial nerves grossly intact. moves all 4 extremities w/o difficulty. Affect pleasant   Remains very tenuous. Making slow progress. Still on dual inotropes/pressors. Co-ox ok this am. Will continue diuresis. Remains in AF. Will rebolus IV amio and continue gtt. May need DC-CV in next day or two. Likely would be beneficial to re-engage Palliative Care this week as options very limited.   CRITICAL CARE Performed by: Glori Bickers  Total critical care time: 35 minutes  Critical care time was exclusive of separately billable procedures and treating other patients.  Critical care was necessary to treat or prevent imminent or life-threatening deterioration.  Critical care was time spent personally by me (independent of midlevel providers or residents) on the following activities: development of treatment plan with patient and/or surrogate as well as nursing,  discussions with consultants, evaluation of patient's response to treatment, examination of patient, obtaining history from patient or surrogate, ordering and performing treatments and interventions, ordering and review of laboratory studies, ordering and review of radiographic studies, pulse oximetry and re-evaluation of patient's condition.  Glori Bickers, MD  9:31 AM

## 2020-02-09 DIAGNOSIS — I4891 Unspecified atrial fibrillation: Secondary | ICD-10-CM

## 2020-02-09 LAB — COOXEMETRY PANEL
Carboxyhemoglobin: 1.8 % — ABNORMAL HIGH (ref 0.5–1.5)
Methemoglobin: 0.8 % (ref 0.0–1.5)
O2 Saturation: 58.3 %
Total hemoglobin: 7.8 g/dL — ABNORMAL LOW (ref 12.0–16.0)

## 2020-02-09 LAB — CBC
HCT: 28.8 % — ABNORMAL LOW (ref 39.0–52.0)
Hemoglobin: 9.6 g/dL — ABNORMAL LOW (ref 13.0–17.0)
MCH: 29.5 pg (ref 26.0–34.0)
MCHC: 33.3 g/dL (ref 30.0–36.0)
MCV: 88.6 fL (ref 80.0–100.0)
Platelets: 256 10*3/uL (ref 150–400)
RBC: 3.25 MIL/uL — ABNORMAL LOW (ref 4.22–5.81)
RDW: 16.3 % — ABNORMAL HIGH (ref 11.5–15.5)
WBC: 6.3 10*3/uL (ref 4.0–10.5)
nRBC: 0 % (ref 0.0–0.2)

## 2020-02-09 LAB — BASIC METABOLIC PANEL
Anion gap: 13 (ref 5–15)
BUN: 28 mg/dL — ABNORMAL HIGH (ref 8–23)
CO2: 29 mmol/L (ref 22–32)
Calcium: 8.6 mg/dL — ABNORMAL LOW (ref 8.9–10.3)
Chloride: 90 mmol/L — ABNORMAL LOW (ref 98–111)
Creatinine, Ser: 2.69 mg/dL — ABNORMAL HIGH (ref 0.61–1.24)
GFR, Estimated: 25 mL/min — ABNORMAL LOW (ref >60)
Glucose, Bld: 225 mg/dL — ABNORMAL HIGH (ref 70–99)
Potassium: 3.5 mmol/L (ref 3.5–5.1)
Sodium: 132 mmol/L — ABNORMAL LOW (ref 135–145)

## 2020-02-09 LAB — GLUCOSE, CAPILLARY
Glucose-Capillary: 107 mg/dL — ABNORMAL HIGH (ref 70–99)
Glucose-Capillary: 85 mg/dL (ref 70–99)
Glucose-Capillary: 111 mg/dL — ABNORMAL HIGH (ref 70–99)
Glucose-Capillary: 129 mg/dL — ABNORMAL HIGH (ref 70–99)

## 2020-02-09 MED ORDER — POTASSIUM CHLORIDE CRYS ER 20 MEQ PO TBCR
40.0000 meq | EXTENDED_RELEASE_TABLET | Freq: Four times a day (QID) | ORAL | Status: AC
  Administered 2020-02-09 (×2): 40 meq via ORAL
  Filled 2020-02-09 (×2): qty 2

## 2020-02-09 NOTE — H&P (View-Only) (Signed)
Advanced Heart Failure Rounding Note  PCP-Cardiologist: Virl Axe, MD   Subjective:    - 12/14 Admitted with a/c systolic heart failure and A fib RVR. On dobutamine 3 mcg chronically.  - 12/15 converted back to NSR on amio gtt  - 12/16 Co-ox dropped to 24%. Lactic acid 2.4. DBA increased to 5 and NE increased to 8.    Yesterday: Received amiodarone 150 bolus.  On NE 8, DBA 5, lasix 30 mg/H.  UOP: -5.4L; & -7lbs since yesterday.  Co-ox 58% with CVP 11.   Reports up and walking today. Denies chest pain, shortness of breath, lightheadedness or dizziness.  He reports mild pain at old [facility] site.  Dressing changed daily still w/ drainage. No fever or chills.      Objective:   Weight Range: 75.3 kg Body mass index is 26.01 kg/m.   Vital Signs:   Temp:  [97.8 F (36.6 C)-98.9 F (37.2 C)] 97.9 F (36.6 C) (12/21 0400) Pulse Rate:  [88-94] 88 (12/20 1600) Resp:  [12-33] 20 (12/21 0715) BP: (79-164)/(50-150) 115/66 (12/21 0715) SpO2:  [89 %-100 %] 89 % (12/21 0545) Weight:  [75.3 kg] 75.3 kg (12/21 0500) Last BM Date: 02/06/20  Weight change: Filed Weights   02/07/20 0450 02/08/20 0500 02/09/20 0500  Weight: 76.4 kg 78.6 kg 75.3 kg    Intake/Output:   Intake/Output Summary (Last 24 hours) at 02/09/2020 0928 Last data filed at 02/09/2020 0600 Gross per 24 hour  Intake 2135.81 ml  Output 4625 ml  Net -2489.19 ml      Physical Exam   CVP 11-12 General:  Well appearing. No resp difficulty HEENT: normal Neck: supple. + JVD/HJR. Cor: PMI nondisplaced. Irregular rate & rhythm. No rubs, gallops but + murmurs. Lungs: clear bilaterally. Abdomen: soft, nontender, nondistended. No hepatosplenomegaly.  Good bowel sounds.  Small amount of purulent drainage noted on dressing.  Mild tenderness with palpation.  Extremities: no cyanosis, clubbing, rash.  Trace pedal edema Neuro: alert & oriented x 3, cranial nerves grossly intact. moves all 4 extremities w/o difficulty.  Affect pleasant    Telemetry   Afib with rates 80-100s and NSVT (6 beats) this AM. Personally reviewed.    Labs    CBC Recent Labs    02/08/20 0521  WBC 6.0  HGB 8.8*  HCT 27.3*  MCV 89.2  PLT 381   Basic Metabolic Panel Recent Labs    02/07/20 0352 02/08/20 0521 02/08/20 1313  NA 128* 127* 128*  K 3.3* 4.1 3.0*  CL 85* 87* 87*  CO2 29 29 28   GLUCOSE 299* 263* 312*  BUN 24* 26* 26*  CREATININE 2.82* 2.73* 2.61*  CALCIUM 8.6* 8.6* 7.8*  MG 2.1  --   --    Liver Function Tests No results for input(s): AST, ALT, ALKPHOS, BILITOT, PROT, ALBUMIN in the last 72 hours. No results for input(s): LIPASE, AMYLASE in the last 72 hours. Cardiac Enzymes No results for input(s): CKTOTAL, CKMB, CKMBINDEX, TROPONINI in the last 72 hours.  BNP: BNP (last 3 results) Recent Labs    06/23/19 1527 12/25/19 1757 01/29/2020 1745  BNP 613.2* 776.7* 1,014.9*    ProBNP (last 3 results) No results for input(s): PROBNP in the last 8760 hours.   D-Dimer No results for input(s): DDIMER in the last 72 hours. Hemoglobin A1C Recent Labs    02/08/20 1313  HGBA1C 4.8   Fasting Lipid Panel No results for input(s): CHOL, HDL, LDLCALC, TRIG, CHOLHDL, LDLDIRECT in the last 72  hours. Thyroid Function Tests No results for input(s): TSH, T4TOTAL, T3FREE, THYROIDAB in the last 72 hours.  Invalid input(s): FREET3  Other results:   Imaging    No results found.   Medications:     Scheduled Medications: . amiodarone  150 mg Intravenous Once  . apixaban  5 mg Oral BID  . atorvastatin  40 mg Oral QPM  . Chlorhexidine Gluconate Cloth  6 each Topical Daily  . gabapentin  300 mg Oral BID  . insulin aspart  0-9 Units Subcutaneous TID WC  . pantoprazole  40 mg Oral Daily  . sodium chloride flush  10-40 mL Intracatheter Q12H    Infusions: . amiodarone 30 mg/hr (02/09/20 0400)  . DOBUTamine 5 mcg/kg/min (02/09/20 0400)  . furosemide (LASIX) 200 mg in dextrose 5% 100 mL (2mg /mL)  infusion 30 mg/hr (02/09/20 0400)  . norepinephrine (LEVOPHED) Adult infusion 8 mcg/min (02/09/20 0537)    PRN Medications: acetaminophen, albuterol, ALPRAZolam, calcium carbonate, nitroGLYCERIN, ondansetron (ZOFRAN) IV, sodium chloride flush, zolpidem    Patient Profile   Mr Pinedo is a 69 y.o.malewith a hx of chronic systolic HF 2/2 NICM s/pBSciICD,persistentatrial fibrillation, h/o stroke (Lg L MCA infarct w/ hemorrhagic conversionin 2018),HTN, HCV,tobacco abuse (smokes ~1/4 ppd), and chronic cholecystitiswith achronic perc drain.      Assessment/Plan   1. A/C Systolic Heart Failure -> cardiogenic shock - [facility] 10/2018 showed normal cors, elevated filling pressures and low CI at 1.9 - TTE10/2020 LVEF 20%, mild RV dysfunction - On home dobutamine 3 mcg.  - Co-ox dropped to 24% on 12/16 and inotropes increased - Yesterday, CVP 13-14, co-ox 68% - On DBA 5 + NE 8 and lasix 30 gtt.  - Continue inotropes at current rate. ? Need for IABP  - CVP 11 with co-ox 58%, d/c weight 162 and today 166.  - Will discuss transition from lasix gtt to IVP.    - No bb/spiro/dig/arini with elevated creatinine.   2. Atrial fib, RVR - Most recent DC-CV 01/06/2020  - Converted to NSR on 12/15 on amio gtt.  - Now back in Afib, 80-100s. Continue IV amio while on pressors/inotropes. - Not candidate for AVN ablation and CRT with chronic liver infection - continue Eliquis. No bleeding  3. AKI on CKD III - Creatinine baseline ~2.5 - Creatinine: 3.3 -> 3.0  -> 2.8->2.7 suspect ATN/cardiorenal - Today Cr is 2.69 - Continue pressors/inotropes.  - Goal is to keep MAP close to 70 with Co-ox 55% or greater.   - Follow renal function daily  4. H/O Ischemic R MCA , 2018  - hx of large R MCA with hemorrhagic conversion in Sept 2020 & L MCA s/p thrombectomy M2 occlusion in 2018. - no weakness on exam; continue with statin  5. Chronic cholecystitis with possible hepatic abscess -  currently not surgical candidate - AF. WBC nl on admit  - [facility] has seen recently.   6. Hyponatremia - Today 132 (Corrected to glucose 134) - Component of hypervolemia and hyperglycemia - continue diuresis. Avoid free water  7. DM2 - Glucose high 12/20 - A1c 4.8%  - on SSI - DM2 consult   Length of Stay: [place], NP  02/09/2020, 9:28 AM  Advanced Heart Failure Team Pager [phone] (M-F; [place])  Please contact [facility] for night-coverage after hours (4p -7a ) and weekends on [email or web address]   Patient seen and examined with the above-signed Advanced Practice Provider and/or Housestaff. I personally reviewed laboratory data, imaging studies  and relevant notes. I independently examined the patient and formulated the important aspects of the plan. I have edited the note to reflect any of my changes or salient points. I have personally discussed the plan with the patient and/or family.  He remains on NE, dobutamine and IV amio. Co-ox improved. Remains in AF. Diuresing well.    General:  Lying in bed. No resp difficulty HEENT: normal Neck: supple. JVP to jaw  Carotids 2+ bilat; no bruits. No lymphadenopathy or thryomegaly appreciated. Cor: PMI nondisplaced. Irregular rate & rhythm. + s3 Lungs: clear Abdomen: soft, mildly tender RUQ, nondistended. No hepatosplenomegaly. No bruits or masses. Good bowel sounds. Extremities: no cyanosis, clubbing, rash, edema Neuro: alert & orientedx3, cranial nerves grossly intact. moves all 4 extremities w/o difficulty. Affect pleasant  Remains tenuous. Continue dobutamine and NE. Continue to diurese. Remains in AF despite IV amio. Plan DC-CV at bedside tomorrow. Prognosis guarded.   CRITICAL CARE Performed by: Glori Bickers  Total critical care time: 35 minutes  Critical care time was exclusive of separately billable procedures and treating other patients.  Critical care was necessary to treat or prevent imminent or  life-threatening deterioration.  Critical care was time spent personally by me (independent of midlevel providers or residents) on the following activities: development of treatment plan with patient and/or surrogate as well as nursing, discussions with consultants, evaluation of patient's response to treatment, examination of patient, obtaining history from patient or surrogate, ordering and performing treatments and interventions, ordering and review of laboratory studies, ordering and review of radiographic studies, pulse oximetry and re-evaluation of patient's condition.  Glori Bickers, MD  4:13 PM

## 2020-02-09 NOTE — Progress Notes (Addendum)
Advanced Heart Failure Rounding Note  PCP-Cardiologist: Virl Axe, MD   Subjective:    - 12/14 Admitted with a/c systolic heart failure and A fib RVR. On dobutamine 3 mcg chronically.  - 12/15 converted back to NSR on amio gtt  - 12/16 Co-ox dropped to 24%. Lactic acid 2.4. DBA increased to 5 and NE increased to 8.    Yesterday: Received amiodarone 150 bolus.  On NE 8, DBA 5, lasix 30 mg/H.  UOP: -5.4L; & -7lbs since yesterday.  Co-ox 58% with CVP 11.   Reports up and walking today. Denies chest pain, shortness of breath, lightheadedness or dizziness.  He reports mild pain at old [facility] site.  Dressing changed daily still w/ drainage. No fever or chills.      Objective:   Weight Range: 75.3 kg Body mass index is 26.01 kg/m.   Vital Signs:   Temp:  [97.8 F (36.6 C)-98.9 F (37.2 C)] 97.9 F (36.6 C) (12/21 0400) Pulse Rate:  [88-94] 88 (12/20 1600) Resp:  [12-33] 20 (12/21 0715) BP: (79-164)/(50-150) 115/66 (12/21 0715) SpO2:  [89 %-100 %] 89 % (12/21 0545) Weight:  [75.3 kg] 75.3 kg (12/21 0500) Last BM Date: 02/06/20  Weight change: Filed Weights   02/07/20 0450 02/08/20 0500 02/09/20 0500  Weight: 76.4 kg 78.6 kg 75.3 kg    Intake/Output:   Intake/Output Summary (Last 24 hours) at 02/09/2020 0928 Last data filed at 02/09/2020 0600 Gross per 24 hour  Intake 2135.81 ml  Output 4625 ml  Net -2489.19 ml      Physical Exam   CVP 11-12 General:  Well appearing. No resp difficulty HEENT: normal Neck: supple. + JVD/HJR. Cor: PMI nondisplaced. Irregular rate & rhythm. No rubs, gallops but + murmurs. Lungs: clear bilaterally. Abdomen: soft, nontender, nondistended. No hepatosplenomegaly.  Good bowel sounds.  Small amount of purulent drainage noted on dressing.  Mild tenderness with palpation.  Extremities: no cyanosis, clubbing, rash.  Trace pedal edema Neuro: alert & oriented x 3, cranial nerves grossly intact. moves all 4 extremities w/o difficulty.  Affect pleasant    Telemetry   Afib with rates 80-100s and NSVT (6 beats) this AM. Personally reviewed.    Labs    CBC Recent Labs    02/08/20 0521  WBC 6.0  HGB 8.8*  HCT 27.3*  MCV 89.2  PLT 361   Basic Metabolic Panel Recent Labs    02/07/20 0352 02/08/20 0521 02/08/20 1313  NA 128* 127* 128*  K 3.3* 4.1 3.0*  CL 85* 87* 87*  CO2 29 29 28   GLUCOSE 299* 263* 312*  BUN 24* 26* 26*  CREATININE 2.82* 2.73* 2.61*  CALCIUM 8.6* 8.6* 7.8*  MG 2.1  --   --    Liver Function Tests No results for input(s): AST, ALT, ALKPHOS, BILITOT, PROT, ALBUMIN in the last 72 hours. No results for input(s): LIPASE, AMYLASE in the last 72 hours. Cardiac Enzymes No results for input(s): CKTOTAL, CKMB, CKMBINDEX, TROPONINI in the last 72 hours.  BNP: BNP (last 3 results) Recent Labs    06/23/19 1527 12/25/19 1757 01/20/2020 1745  BNP 613.2* 776.7* 1,014.9*    ProBNP (last 3 results) No results for input(s): PROBNP in the last 8760 hours.   D-Dimer No results for input(s): DDIMER in the last 72 hours. Hemoglobin A1C Recent Labs    02/08/20 1313  HGBA1C 4.8   Fasting Lipid Panel No results for input(s): CHOL, HDL, LDLCALC, TRIG, CHOLHDL, LDLDIRECT in the last 72  hours. Thyroid Function Tests No results for input(s): TSH, T4TOTAL, T3FREE, THYROIDAB in the last 72 hours.  Invalid input(s): FREET3  Other results:   Imaging    No results found.   Medications:     Scheduled Medications: . amiodarone  150 mg Intravenous Once  . apixaban  5 mg Oral BID  . atorvastatin  40 mg Oral QPM  . Chlorhexidine Gluconate Cloth  6 each Topical Daily  . gabapentin  300 mg Oral BID  . insulin aspart  0-9 Units Subcutaneous TID WC  . pantoprazole  40 mg Oral Daily  . sodium chloride flush  10-40 mL Intracatheter Q12H    Infusions: . amiodarone 30 mg/hr (02/09/20 0400)  . DOBUTamine 5 mcg/kg/min (02/09/20 0400)  . furosemide (LASIX) 200 mg in dextrose 5% 100 mL (2mg /mL)  infusion 30 mg/hr (02/09/20 0400)  . norepinephrine (LEVOPHED) Adult infusion 8 mcg/min (02/09/20 0537)    PRN Medications: acetaminophen, albuterol, ALPRAZolam, calcium carbonate, nitroGLYCERIN, ondansetron (ZOFRAN) IV, sodium chloride flush, zolpidem    Patient Profile   Glenn [name] is a 69 y.o.malewith a hx of chronic systolic HF 2/2 NICM s/pBSciICD,persistentatrial fibrillation, h/o stroke (Lg L MCA infarct w/ hemorrhagic conversionin 2018),HTN, HCV,tobacco abuse (smokes ~1/4 ppd), and chronic cholecystitiswith achronic perc drain.      Assessment/Plan   1. A/C Systolic Heart Failure -> cardiogenic shock - [facility] 10/2018 showed normal cors, elevated filling pressures and low CI at 1.9 - TTE10/2020 LVEF 20%, mild RV dysfunction - On home dobutamine 3 mcg.  - Co-ox dropped to 24% on 12/16 and inotropes increased - Yesterday, CVP 13-14, co-ox 68% - On DBA 5 + NE 8 and lasix 30 gtt.  - Continue inotropes at current rate. ? Need for IABP  - CVP 11 with co-ox 58%, d/c weight 162 and today 166.  - Will discuss transition from lasix gtt to IVP.    - No bb/spiro/dig/arini with elevated creatinine.   2. Atrial fib, RVR - Most recent DC-CV 01/06/2020  - Converted to NSR on 12/15 on amio gtt.  - Now back in Afib, 80-100s. Continue IV amio while on pressors/inotropes. - Not candidate for AVN ablation and CRT with chronic liver infection - continue Eliquis. No bleeding  3. AKI on CKD III - Creatinine baseline ~2.5 - Creatinine: 3.3 -> 3.0  -> 2.8->2.7 suspect ATN/cardiorenal - Today Cr is 2.69 - Continue pressors/inotropes.  - Goal is to keep MAP close to 70 with Co-ox 55% or greater.   - Follow renal function daily  4. H/O Ischemic R MCA , 2018  - hx of large R MCA with hemorrhagic conversion in Sept 2020 & L MCA s/p thrombectomy M2 occlusion in 2018. - no weakness on exam; continue with statin  5. Chronic cholecystitis with possible hepatic abscess -  currently not surgical candidate - AF. WBC nl on admit  - [facility] has seen recently.   6. Hyponatremia - Today 132 (Corrected to glucose 134) - Component of hypervolemia and hyperglycemia - continue diuresis. Avoid free water  7. DM2 - Glucose high 12/20 - A1c 4.8%  - on SSI - DM2 consult   Length of Stay: [place], NP  02/09/2020, 9:28 AM  Advanced Heart Failure Team Pager [phone] (M-F; [place])  Please contact [facility] for night-coverage after hours (4p -7a ) and weekends on [email or web address]   Patient seen and examined with the above-signed Advanced Practice Provider and/or Housestaff. I personally reviewed laboratory data, imaging studies  and relevant notes. I independently examined the patient and formulated the important aspects of the plan. I have edited the note to reflect any of my changes or salient points. I have personally discussed the plan with the patient and/or family.  He remains on NE, dobutamine and IV amio. Co-ox improved. Remains in AF. Diuresing well.    General:  Lying in bed. No resp difficulty HEENT: normal Neck: supple. JVP to jaw  Carotids 2+ bilat; no bruits. No lymphadenopathy or thryomegaly appreciated. Cor: PMI nondisplaced. Irregular rate & rhythm. + s3 Lungs: clear Abdomen: soft, mildly tender RUQ, nondistended. No hepatosplenomegaly. No bruits or masses. Good bowel sounds. Extremities: no cyanosis, clubbing, rash, edema Neuro: alert & orientedx3, cranial nerves grossly intact. moves all 4 extremities w/o difficulty. Affect pleasant  Remains tenuous. Continue dobutamine and NE. Continue to diurese. Remains in AF despite IV amio. Plan DC-CV at bedside tomorrow. Prognosis guarded.   CRITICAL CARE Performed by: Glori Bickers  Total critical care time: 35 minutes  Critical care time was exclusive of separately billable procedures and treating other patients.  Critical care was necessary to treat or prevent imminent or  life-threatening deterioration.  Critical care was time spent personally by me (independent of midlevel providers or residents) on the following activities: development of treatment plan with patient and/or surrogate as well as nursing, discussions with consultants, evaluation of patient's response to treatment, examination of patient, obtaining history from patient or surrogate, ordering and performing treatments and interventions, ordering and review of laboratory studies, ordering and review of radiographic studies, pulse oximetry and re-evaluation of patient's condition.  Glori Bickers, MD  4:13 PM

## 2020-02-09 NOTE — Anesthesia Preprocedure Evaluation (Addendum)
Anesthesia Evaluation  Patient identified by MRN, date of birth, ID band Patient awake    Reviewed: Allergy & Precautions, NPO status , Patient's Chart, lab work & pertinent test results  Airway Mallampati: II  TM Distance: >3 FB Neck ROM: Full    Dental  (+) Missing, Dental Advisory Given, Poor Dentition, Chipped   Pulmonary Patient abstained from smoking., former smoker,    Pulmonary exam normal breath sounds clear to auscultation       Cardiovascular hypertension, +CHF and + DVT  + dysrhythmias Atrial Fibrillation + Cardiac Defibrillator  Rhythm:Irregular Rate:Normal  Ischemic dilated CM  EKG 02/06/20 NSR, LAD, LVH, non specific IVCD  Echo 01/07/20 1.Left ventricular ejection fraction, by estimation, is 20 to 25%. The left ventricle has severely decreased function. The left ventricle demonstrates global hypokinesis. The left ventricular internal cavity size was severely dilated. There is mild left ventricular hypertrophy. Left ventricular diastolic parameters are consistent with Grade II diastolic dysfunction (pseudonormalization). Elevated left ventricular end-diastolic pressure. 2. Pacing wires in RA/RV. Right ventricular systolic function is normal. The right ventricular size is normal. There is severely elevated pulmonary artery systolic pressure. 3. Left atrial size was severely dilated. 4. The mitral valve is normal in structure. Mild mitral valve regurgitation. No evidence of mitral stenosis. 5. Tricuspid valve regurgitation is severe. 6. The aortic valve is tricuspid. Aortic valve regurgitation is mild to moderate. Mild aortic valve sclerosis is present, with no evidence of aortic valve stenosis. 7. The inferior vena cava is dilated in size with >50% respiratory variability, suggesting right atrial pressure of 8 mmHg.  Cardiac Cath 01/13/20 1. Mildly elevated filling pressures 2. Preserved CO on dobutamine  58mcg/kg/min 3. Borderline PAPi    Neuro/Psych  Headaches, Anxiety Right hemiplegia due to acute embolic stroke territory of left MCA  Neuromuscular disease CVA, Residual Symptoms    GI/Hepatic negative GI ROS, (+)     substance abuse  marijuana use, Hepatitis -, C  Endo/Other  Hyperlipidemia   Renal/GU Renal InsufficiencyRenal disease   ED    Musculoskeletal   Abdominal   Peds  Hematology  (+) anemia , Chronic anticoagulation on heparin gtt Eliquis - last dose 12/14   Anesthesia Other Findings On Amiodarone, Dobutamine, Levophed and Furosemide drips  Reproductive/Obstetrics                           Anesthesia Physical Anesthesia Plan  ASA: IV  Anesthesia Plan: General   Post-op Pain Management:    Induction: Intravenous  PONV Risk Score and Plan: 2 and Treatment may vary due to age or medical condition and Ondansetron  Airway Management Planned: Mask and Natural Airway  Additional Equipment:   Intra-op Plan:   Post-operative Plan:   Informed Consent: I have reviewed the patients History and Physical, chart, labs and discussed the procedure including the risks, benefits and alternatives for the proposed anesthesia with the patient or authorized representative who has indicated his/her understanding and acceptance.     Dental advisory given  Plan Discussed with: CRNA and Anesthesiologist  Anesthesia Plan Comments: (Will use Etomidate with possible small dose of propofol.)       Anesthesia Quick Evaluation

## 2020-02-10 ENCOUNTER — Inpatient Hospital Stay (HOSPITAL_COMMUNITY): Payer: Medicare HMO | Admitting: Anesthesiology

## 2020-02-10 ENCOUNTER — Encounter (HOSPITAL_COMMUNITY): Admission: EM | Disposition: E | Payer: Self-pay | Source: Home / Self Care | Attending: Internal Medicine

## 2020-02-10 HISTORY — PX: CARDIOVERSION: SHX1299

## 2020-02-10 LAB — COOXEMETRY PANEL
Carboxyhemoglobin: 1.3 % (ref 0.5–1.5)
Methemoglobin: 0.9 % (ref 0.0–1.5)
O2 Saturation: 45.8 %
Total hemoglobin: 9.9 g/dL — ABNORMAL LOW (ref 12.0–16.0)

## 2020-02-10 LAB — GLUCOSE, CAPILLARY
Glucose-Capillary: 126 mg/dL — ABNORMAL HIGH (ref 70–99)
Glucose-Capillary: 146 mg/dL — ABNORMAL HIGH (ref 70–99)
Glucose-Capillary: 124 mg/dL — ABNORMAL HIGH (ref 70–99)
Glucose-Capillary: 146 mg/dL — ABNORMAL HIGH (ref 70–99)

## 2020-02-10 LAB — BASIC METABOLIC PANEL
Anion gap: 12 (ref 5–15)
BUN: 30 mg/dL — ABNORMAL HIGH (ref 8–23)
CO2: 32 mmol/L (ref 22–32)
Calcium: 8.9 mg/dL (ref 8.9–10.3)
Chloride: 91 mmol/L — ABNORMAL LOW (ref 98–111)
Creatinine, Ser: 2.69 mg/dL — ABNORMAL HIGH (ref 0.61–1.24)
GFR, Estimated: 25 mL/min — ABNORMAL LOW (ref >60)
Glucose, Bld: 105 mg/dL — ABNORMAL HIGH (ref 70–99)
Potassium: 4.1 mmol/L (ref 3.5–5.1)
Sodium: 135 mmol/L (ref 135–145)

## 2020-02-10 LAB — CBC
HCT: 28.8 % — ABNORMAL LOW (ref 39.0–52.0)
Hemoglobin: 9.1 g/dL — ABNORMAL LOW (ref 13.0–17.0)
MCH: 28.3 pg (ref 26.0–34.0)
MCHC: 31.6 g/dL (ref 30.0–36.0)
MCV: 89.4 fL (ref 80.0–100.0)
Platelets: 225 10*3/uL (ref 150–400)
RBC: 3.22 MIL/uL — ABNORMAL LOW (ref 4.22–5.81)
RDW: 16.4 % — ABNORMAL HIGH (ref 11.5–15.5)
WBC: 5.7 10*3/uL (ref 4.0–10.5)
nRBC: 0 % (ref 0.0–0.2)

## 2020-02-10 LAB — PROTIME-INR
INR: 1.6 — ABNORMAL HIGH (ref 0.8–1.2)
Prothrombin Time: 18.4 seconds — ABNORMAL HIGH (ref 11.4–15.2)

## 2020-02-10 SURGERY — CARDIOVERSION
Anesthesia: General

## 2020-02-10 MED ORDER — SODIUM CHLORIDE 0.9 % IV SOLN: INTRAVENOUS | Status: DC | PRN

## 2020-02-10 MED ORDER — ETOMIDATE 2 MG/ML IV SOLN
INTRAVENOUS | Status: DC | PRN
  Administered 2020-02-10: 14 mg via INTRAVENOUS

## 2020-02-10 MED ORDER — LIDOCAINE 2% (20 MG/ML) 5 ML SYRINGE
INTRAMUSCULAR | Status: DC | PRN
  Administered 2020-02-10: 50 mg via INTRAVENOUS

## 2020-02-10 MED ORDER — RANOLAZINE ER 500 MG PO TB12
500.0000 mg | ORAL_TABLET | Freq: Two times a day (BID) | ORAL | Status: DC
  Administered 2020-02-10 – 2020-02-12 (×6): 500 mg via ORAL
  Filled 2020-02-10 (×6): qty 1

## 2020-02-10 MED ORDER — POTASSIUM CHLORIDE CRYS ER 20 MEQ PO TBCR
40.0000 meq | EXTENDED_RELEASE_TABLET | Freq: Once | ORAL | Status: AC
  Administered 2020-02-10: 13:00:00 40 meq via ORAL
  Filled 2020-02-10: qty 2

## 2020-02-10 MED ORDER — METOLAZONE 5 MG PO TABS
5.0000 mg | ORAL_TABLET | Freq: Once | ORAL | Status: AC
  Administered 2020-02-10: 10:00:00 5 mg via ORAL
  Filled 2020-02-10: qty 1

## 2020-02-10 MED ORDER — NOREPINEPHRINE 16 MG/250ML-% IV SOLN
8.0000 ug/min | INTRAVENOUS | Status: DC
  Administered 2020-02-10 – 2020-02-13 (×2): 8 ug/min via INTRAVENOUS
  Filled 2020-02-10 (×3): qty 250

## 2020-02-10 NOTE — CV Procedure (Signed)
    DIRECT CURRENT CARDIOVERSION  NAME:  Glenn [name]   MRN: [id number] DOB:  05-22-1950   ADMIT DATE: 02/03/2020   INDICATIONS: Atrial fibrillation    PROCEDURE:   Informed consent was obtained prior to the procedure. The risks, benefits and alternatives for the procedure were discussed and the patient comprehended these risks. Once an appropriate time out was taken, the patient had the defibrillator pads placed in the anterior and posterior position. The patient then underwent sedation by the anesthesia service. Once an appropriate level of sedation was achieved, the patient received a single biphasic, synchronized 200J shock with prompt conversion to sinus rhythm. No apparent complications.  Glori Bickers, MD  8:23 AM

## 2020-02-10 NOTE — Anesthesia Procedure Notes (Signed)
Date/Time: 01/29/2020 8:05 AM Performed by: Trinna Post., CRNA Pre-anesthesia Checklist: Patient identified, Emergency Drugs available, Suction available, Patient being monitored and Timeout performed Patient Re-evaluated:Patient Re-evaluated prior to induction Oxygen Delivery Method: Ambu bag Preoxygenation: Pre-oxygenation with 100% oxygen Induction Type: IV induction Placement Confirmation: positive ETCO2

## 2020-02-10 NOTE — Transfer of Care (Signed)
Immediate Anesthesia Transfer of Care Note  Patient: Glenn [name]  Procedure(s) Performed: CARDIOVERSION (N/A )  Patient Location: PACU and ICU  Anesthesia Type:General  Level of Consciousness: drowsy  Airway & Oxygen Therapy: Patient Spontanous Breathing  Post-op Assessment: Report given to RN and Post -op Vital signs reviewed and stable  Post vital signs: Reviewed and stable  Last Vitals:  Vitals Value Taken Time  BP 123/70 02/13/2020 0820  Temp    Pulse    Resp 14 02/09/2020 0822  SpO2 100 % 02/03/2020 0822  Vitals shown include unvalidated device data.  Last Pain:  Vitals:   02/12/2020 0748  TempSrc: Oral  PainSc:       Patients Stated Pain Goal: 2 (92/34/14 4360)  Complications: No complications documented.

## 2020-02-10 NOTE — Progress Notes (Signed)
Advanced Heart Failure Rounding Note  PCP-Cardiologist: Virl Axe, MD   Subjective:    - 12/14 Admitted with a/c systolic heart failure and A fib RVR. On dobutamine 3 mcg chronically.  - 12/15 converted back to NSR on amio gtt  - 12/16 Co-ox dropped to 24%. Lactic acid 2.4. DBA increased to 5 and NE increased to 8.    Underwent DC-CV for AF this am.  On NE 8, DBA 5, lasix 30 mg/H.  UOP: about 2.5L but CVP still 15-16. Denies SOB, orthopnea or PND. Mild ab pain  Dressing changed daily still w/ drainage. No fever or chills.      Objective:   Weight Range: 75.1 kg Body mass index is 25.93 kg/m.   Vital Signs:   Temp:  [97.4 F (36.3 C)-97.8 F (36.6 C)] 97.6 F (36.4 C) (12/22 0748) Resp:  [12-28] 15 (12/22 0700) BP: (97-145)/(51-129) 116/84 (12/22 0700) SpO2:  [89 %-99 %] 95 % (12/22 0600) Weight:  [75.1 kg] 75.1 kg (12/22 0500) Last BM Date: 02/09/20  Weight change: Filed Weights   02/08/20 0500 02/09/20 0500 01/22/2020 0500  Weight: 78.6 kg 75.3 kg 75.1 kg    Intake/Output:   Intake/Output Summary (Last 24 hours) at 02/07/2020 0824 Last data filed at 02/07/2020 0819 Gross per 24 hour  Intake 1439.6 ml  Output 2625 ml  Net -1185.4 ml      Physical Exam   CVP 15-16 General:  Lying flat in bed No resp difficulty HEENT: normal Neck: supple.JVP to ear . Carotids 2+ bilat; no bruits. No lymphadenopathy or thryomegaly appreciated. Cor: PMI nondisplaced. Regular rate & rhythm. No rubs, gallops or murmurs. Lungs: clear Abdomen: soft, mildly tender RUQ, nondistended. No hepatosplenomegaly. No bruits or masses. Good bowel sounds. Extremities: no cyanosis, clubbing, rash, edema Neuro: alert & orientedx3, cranial nerves grossly intact. moves all 4 extremities w/o difficulty. Affect pleasant  Telemetry   Afib -> NSR after DC-CV this am. Personally reviewed.    Labs    CBC Recent Labs    02/09/20 0816 02/11/2020 0248  WBC 6.3 5.7  HGB 9.6* 9.1*  HCT  28.8* 28.8*  MCV 88.6 89.4  PLT 256 628   Basic Metabolic Panel Recent Labs    02/09/20 0816 01/20/2020 0248  NA 132* 135  K 3.5 4.1  CL 90* 91*  CO2 29 32  GLUCOSE 225* 105*  BUN 28* 30*  CREATININE 2.69* 2.69*  CALCIUM 8.6* 8.9   Liver Function Tests No results for input(s): AST, ALT, ALKPHOS, BILITOT, PROT, ALBUMIN in the last 72 hours. No results for input(s): LIPASE, AMYLASE in the last 72 hours. Cardiac Enzymes No results for input(s): CKTOTAL, CKMB, CKMBINDEX, TROPONINI in the last 72 hours.  BNP: BNP (last 3 results) Recent Labs    06/23/19 1527 12/25/19 1757 01/25/2020 1745  BNP 613.2* 776.7* 1,014.9*    ProBNP (last 3 results) No results for input(s): PROBNP in the last 8760 hours.   D-Dimer No results for input(s): DDIMER in the last 72 hours. Hemoglobin A1C Recent Labs    02/08/20 1313  HGBA1C 4.8   Fasting Lipid Panel No results for input(s): CHOL, HDL, LDLCALC, TRIG, CHOLHDL, LDLDIRECT in the last 72 hours. Thyroid Function Tests No results for input(s): TSH, T4TOTAL, T3FREE, THYROIDAB in the last 72 hours.  Invalid input(s): FREET3  Other results:   Imaging    No results found.   Medications:     Scheduled Medications: . apixaban  5 mg Oral BID  .  atorvastatin  40 mg Oral QPM  . Chlorhexidine Gluconate Cloth  6 each Topical Daily  . gabapentin  300 mg Oral BID  . insulin aspart  0-9 Units Subcutaneous TID WC  . pantoprazole  40 mg Oral Daily  . sodium chloride flush  10-40 mL Intracatheter Q12H    Infusions: . amiodarone 30 mg/hr (01/30/2020 0700)  . DOBUTamine 5 mcg/kg/min (01/27/2020 0700)  . furosemide (LASIX) 200 mg in dextrose 5% 100 mL (2mg /mL) infusion 30 mg/hr (02/18/2020 0700)  . norepinephrine (LEVOPHED) Adult infusion 8 mcg/min (02/18/2020 0700)    PRN Medications: acetaminophen, albuterol, ALPRAZolam, calcium carbonate, nitroGLYCERIN, ondansetron (ZOFRAN) IV, sodium chloride flush, zolpidem    Patient Profile   Mr  Wrightsman is a 69 y.o.malewith a hx of chronic systolic HF 2/2 NICM s/pBSciICD,persistentatrial fibrillation, h/o stroke (Lg L MCA infarct w/ hemorrhagic conversionin 2018),HTN, HCV,tobacco abuse (smokes ~1/4 ppd), and chronic cholecystitiswith achronic perc drain.      Assessment/Plan   1. A/C Systolic Heart Failure -> cardiogenic shock - [facility] 10/2018 showed normal cors, elevated filling pressures and low CI at 1.9 - TTE10/2020 LVEF 20%, mild RV dysfunction - On home dobutamine 3 mcg.  - Co-ox dropped to 24% on 12/16 and inotropes increased - On DBA 5 + NE 8 and lasix 30 gtt. Co-ox back down to 46%. Hopefully will improve with NSR.  - Continue inotropes at current rate - CVP 15-16  d/c weight 162 and today 165 - Will give metolazone today to try to get weight back to baseline before weaning pressors - No bb/spiro/dig/arini with elevated creatinine.   2. Atrial fib, RVR - Most recent DC-CV 01/06/2020  - Converted to NSR on 12/15 on amio gtt.  - Repeat DC-CV this am. Continue amio. Add ranexa 500 bid - Not candidate for AVN ablation and CRT with chronic liver infection - continue Eliquis. No bleeding  3. AKI on CKD III - Creatinine baseline ~2.5 - Creatinine no back to baseline at 2.6-2.7 - Continue pressors/inotropes.  - Goal is to keep MAP close to 70 with Co-ox 55% or greater.   - Follow renal function daily  4. H/O Ischemic R MCA , 2018  - hx of large R MCA with hemorrhagic conversion in Sept 2020 & L MCA s/p thrombectomy M2 occlusion in 2018. - no weakness on exam; continue with statin  5. Chronic cholecystitis with possible hepatic abscess - currently not surgical candidate - AF. WBC nl on admit  - [facility] has seen recently.   6. Hyponatremia - Improved to 135 - continue diuresis. Avoid free water  7. DM2 - Glucose high 12/20 - A1c 4.8%  - on SSI - DM2 consult  CRITICAL CARE Performed by: Glori Bickers  Total critical care time: 35  minutes  Critical care time was exclusive of separately billable procedures and treating other patients.  Critical care was necessary to treat or prevent imminent or life-threatening deterioration.  Critical care was time spent personally by me (independent of midlevel providers or residents) on the following activities: development of treatment plan with patient and/or surrogate as well as nursing, discussions with consultants, evaluation of patient's response to treatment, examination of patient, obtaining history from patient or surrogate, ordering and performing treatments and interventions, ordering and review of laboratory studies, ordering and review of radiographic studies, pulse oximetry and re-evaluation of patient's condition.   Length of Stay: [place], MD  02/08/2020, 8:24 AM  Advanced Heart Failure Team Pager [phone] (M-F; 7a -  4p)  Please contact [facility] for night-coverage after hours (4p -7a ) and weekends on [email or web address]   Patient seen and examined with the above-signed Advanced Practice Provider and/or Housestaff. I personally reviewed laboratory data, imaging studies and relevant notes. I independently examined the patient and formulated the important aspects of the plan. I have edited the note to reflect any of my changes or salient points. I have personally discussed the plan with the patient and/or family.  He remains on NE, dobutamine and IV amio. Co-ox improved. Remains in AF. Diuresing well.    General:  Lying in bed. No resp difficulty HEENT: normal Neck: supple. JVP to jaw  Carotids 2+ bilat; no bruits. No lymphadenopathy or thryomegaly appreciated. Cor: PMI nondisplaced. Irregular rate & rhythm. + s3 Lungs: clear Abdomen: soft, mildly tender RUQ, nondistended. No hepatosplenomegaly. No bruits or masses. Good bowel sounds. Extremities: no cyanosis, clubbing, rash, edema Neuro: alert & orientedx3, cranial nerves grossly intact. moves all 4  extremities w/o difficulty. Affect pleasant  Remains tenuous. Continue dobutamine and NE. Continue to diurese. Remains in AF despite IV amio. Plan DC-CV at bedside tomorrow. Prognosis guarded.   CRITICAL CARE Performed by: Glori Bickers  Total critical care time: 35 minutes  Critical care time was exclusive of separately billable procedures and treating other patients.  Critical care was necessary to treat or prevent imminent or life-threatening deterioration.  Critical care was time spent personally by me (independent of midlevel providers or residents) on the following activities: development of treatment plan with patient and/or surrogate as well as nursing, discussions with consultants, evaluation of patient's response to treatment, examination of patient, obtaining history from patient or surrogate, ordering and performing treatments and interventions, ordering and review of laboratory studies, ordering and review of radiographic studies, pulse oximetry and re-evaluation of patient's condition.  Glori Bickers, MD  8:24 AM

## 2020-02-10 NOTE — Anesthesia Postprocedure Evaluation (Signed)
Anesthesia Post Note  Patient: Glenn [name]  Procedure(s) Performed: CARDIOVERSION (N/A )     Patient location during evaluation: PACU Anesthesia Type: General Level of consciousness: awake and alert and oriented Pain management: pain level controlled Vital Signs Assessment: post-procedure vital signs reviewed and stable Respiratory status: spontaneous breathing, nonlabored ventilation and respiratory function stable Cardiovascular status: blood pressure returned to baseline and stable Postop Assessment: no apparent nausea or vomiting Anesthetic complications: no   No complications documented.  Last Vitals:  Vitals:   02/18/2020 0700 01/24/2020 0748  BP: 116/84   Pulse:    Resp: 15   Temp:  36.4 C  SpO2:      Last Pain:  Vitals:   02/03/2020 0748  TempSrc: Oral  PainSc:                  [name] A.

## 2020-02-10 NOTE — Interval H&P Note (Signed)
History and Physical Interval Note:  01/23/2020 8:22 AM  Glenn [name]  has presented today for surgery, with the diagnosis of Afib.  The various methods of treatment have been discussed with the patient and family. After consideration of risks, benefits and other options for treatment, the patient has consented to  Procedure(s): CARDIOVERSION (N/A) as a surgical intervention.  The patient's history has been reviewed, patient examined, no change in status, stable for surgery.  I have reviewed the patient's chart and labs.  Questions were answered to the patient's satisfaction.     [name]

## 2020-02-11 ENCOUNTER — Encounter (HOSPITAL_COMMUNITY): Payer: Self-pay | Admitting: Internal Medicine

## 2020-02-11 LAB — BASIC METABOLIC PANEL
Anion gap: 14 (ref 5–15)
BUN: 32 mg/dL — ABNORMAL HIGH (ref 8–23)
CO2: 33 mmol/L — ABNORMAL HIGH (ref 22–32)
Calcium: 8.9 mg/dL (ref 8.9–10.3)
Chloride: 83 mmol/L — ABNORMAL LOW (ref 98–111)
Creatinine, Ser: 2.84 mg/dL — ABNORMAL HIGH (ref 0.61–1.24)
GFR, Estimated: 24 mL/min — ABNORMAL LOW (ref >60)
Glucose, Bld: 239 mg/dL — ABNORMAL HIGH (ref 70–99)
Potassium: 3.6 mmol/L (ref 3.5–5.1)
Sodium: 130 mmol/L — ABNORMAL LOW (ref 135–145)

## 2020-02-11 LAB — COOXEMETRY PANEL
Carboxyhemoglobin: 1.5 % (ref 0.5–1.5)
Carboxyhemoglobin: 1.5 % (ref 0.5–1.5)
Methemoglobin: 0.9 % (ref 0.0–1.5)
Methemoglobin: 0.7 % (ref 0.0–1.5)
O2 Saturation: 40.4 %
O2 Saturation: 43.8 %
Total hemoglobin: 9.5 g/dL — ABNORMAL LOW (ref 12.0–16.0)
Total hemoglobin: 10.6 g/dL — ABNORMAL LOW (ref 12.0–16.0)

## 2020-02-11 LAB — GLUCOSE, CAPILLARY
Glucose-Capillary: 116 mg/dL — ABNORMAL HIGH (ref 70–99)
Glucose-Capillary: 118 mg/dL — ABNORMAL HIGH (ref 70–99)
Glucose-Capillary: 186 mg/dL — ABNORMAL HIGH (ref 70–99)
Glucose-Capillary: 129 mg/dL — ABNORMAL HIGH (ref 70–99)

## 2020-02-11 MED ORDER — POTASSIUM CHLORIDE CRYS ER 20 MEQ PO TBCR
40.0000 meq | EXTENDED_RELEASE_TABLET | Freq: Once | ORAL | Status: AC
  Administered 2020-02-11: 11:00:00 40 meq via ORAL
  Filled 2020-02-11: qty 2

## 2020-02-11 MED ORDER — TORSEMIDE 20 MG PO TABS
80.0000 mg | ORAL_TABLET | Freq: Two times a day (BID) | ORAL | Status: DC
  Administered 2020-02-11: 18:00:00 80 mg via ORAL
  Filled 2020-02-11: qty 4

## 2020-02-11 NOTE — Plan of Care (Signed)

## 2020-02-11 NOTE — Progress Notes (Addendum)
Advanced Heart Failure Rounding Note  PCP-Cardiologist: Virl Axe, MD   Subjective:    - 12/14 Admitted with a/c systolic heart failure and A fib RVR. On dobutamine 3 mcg chronically.  - 12/15 converted back to NSR on amio gtt  - 12/16 Co-ox dropped to 24%. Lactic acid 2.4. DBA increased to 5 and NE increased to 8.   - 12/23 DCCV for AF   He remains in NSR this morning. HR 60s.   On NE 8, DBA 5, lasix 30 mg/hr.  -4L in UOP yesterday. CVP trending down 10-11 today. Wt down 5 lb.  SCr trending up, 2.69>>2.84. K 3.6  Co-ox low at 40%. Will repeat.  Looks and feels well. Sitting up on side of bed. No complaints. Denies dyspnea. Wants to get up to walk.      Objective:   Weight Range: 72.8 kg Body mass index is 25.14 kg/m.   Vital Signs:   Temp:  [97.5 F (36.4 C)-98.4 F (36.9 C)] 98.4 F (36.9 C) (12/23 0700) Resp:  [14-28] 21 (12/23 0700) BP: (98-123)/(54-87) 112/65 (12/23 0700) SpO2:  [93 %-100 %] 94 % (12/23 0200) Weight:  [72.8 kg] 72.8 kg (12/23 0537) Last BM Date: 02/11/2020  Weight change: Filed Weights   02/09/20 0500 01/28/2020 0500 02/11/20 0537  Weight: 75.3 kg 75.1 kg 72.8 kg    Intake/Output:   Intake/Output Summary (Last 24 hours) at 02/11/2020 0907 Last data filed at 02/11/2020 0700 Gross per 24 hour  Intake 1670.83 ml  Output 3625 ml  Net -1954.17 ml      Physical Exam   CVP 10-11  General:  Well appearing. No respiratory difficulty HEENT: normal Neck: supple.  JVD 10 cm. Carotids 2+ bilat; no bruits. No lymphadenopathy or thyromegaly appreciated. Cor: PMI nondisplaced. Regular rate & rhythm. No rubs, gallops or murmurs. Lungs: clear Abdomen: soft, nontender, nondistended. No hepatosplenomegaly. No bruits or masses. Good bowel sounds. Extremities: no cyanosis, clubbing, rash, edema + RUE PICC  Neuro: alert & oriented x 3, cranial nerves grossly intact. moves all 4 extremities w/o difficulty. Affect pleasant.   Telemetry   NSR 60s   Personally reviewed.    Labs    CBC Recent Labs    02/09/20 0816 02/15/2020 0248  WBC 6.3 5.7  HGB 9.6* 9.1*  HCT 28.8* 28.8*  MCV 88.6 89.4  PLT 256 132   Basic Metabolic Panel Recent Labs    02/09/20 0816 01/27/2020 0248  NA 132* 135  K 3.5 4.1  CL 90* 91*  CO2 29 32  GLUCOSE 225* 105*  BUN 28* 30*  CREATININE 2.69* 2.69*  CALCIUM 8.6* 8.9   Liver Function Tests No results for input(s): AST, ALT, ALKPHOS, BILITOT, PROT, ALBUMIN in the last 72 hours. No results for input(s): LIPASE, AMYLASE in the last 72 hours. Cardiac Enzymes No results for input(s): CKTOTAL, CKMB, CKMBINDEX, TROPONINI in the last 72 hours.  BNP: BNP (last 3 results) Recent Labs    06/23/19 1527 12/25/19 1757 02/09/2020 1745  BNP 613.2* 776.7* 1,014.9*    ProBNP (last 3 results) No results for input(s): PROBNP in the last 8760 hours.   D-Dimer No results for input(s): DDIMER in the last 72 hours. Hemoglobin A1C Recent Labs    02/08/20 1313  HGBA1C 4.8   Fasting Lipid Panel No results for input(s): CHOL, HDL, LDLCALC, TRIG, CHOLHDL, LDLDIRECT in the last 72 hours. Thyroid Function Tests No results for input(s): TSH, T4TOTAL, T3FREE, THYROIDAB in the last 72 hours.  Invalid input(s): FREET3  Other results:   Imaging    No results found.   Medications:     Scheduled Medications: . apixaban  5 mg Oral BID  . atorvastatin  40 mg Oral QPM  . Chlorhexidine Gluconate Cloth  6 each Topical Daily  . gabapentin  300 mg Oral BID  . insulin aspart  0-9 Units Subcutaneous TID WC  . pantoprazole  40 mg Oral Daily  . ranolazine  500 mg Oral BID  . sodium chloride flush  10-40 mL Intracatheter Q12H    Infusions: . amiodarone 30 mg/hr (02/11/20 5284)  . DOBUTamine 5 mcg/kg/min (02/11/20 1324)  . furosemide (LASIX) 200 mg in dextrose 5% 100 mL (2mg /mL) infusion 30 mg/hr (02/11/20 4010)  . norepinephrine (LEVOPHED) Adult infusion 8 mcg/min (02/11/20 0614)    PRN  Medications: acetaminophen, albuterol, ALPRAZolam, calcium carbonate, nitroGLYCERIN, ondansetron (ZOFRAN) IV, sodium chloride flush, zolpidem    Patient Profile   Mr Tuckey is a 69 y.o.malewith a hx of chronic systolic HF 2/2 NICM s/pBSciICD,persistentatrial fibrillation, h/o stroke (Lg L MCA infarct w/ hemorrhagic conversionin 2018),HTN, HCV,tobacco abuse (smokes ~1/4 ppd), and chronic cholecystitiswith achronic perc drain.      Assessment/Plan   1. A/C Systolic Heart Failure -> cardiogenic shock - [facility] 10/2018 showed normal cors, elevated filling pressures and low CI at 1.9 - TTE10/2020 LVEF 20%, mild RV dysfunction - On home dobutamine 3 mcg.  - Co-ox dropped to 24% on 12/16 and inotropes increased - On DBA 5 + NE 8 and lasix 30 gtt. Co-ox back down to 40% this morning (? Stunning post conversion). Hopefully will improve with NSR. Repeat co-ox - Continue inotropes at current rate - CVP 11 d/c weight 162 and today 160. Wean Lasix gtt, reduce to 20/hr. Follow CVP - No bb/spiro/dig/arini with elevated creatinine.   2. Atrial fib, RVR - Most recent DC-CV 01/06/2020  - Converted to NSR on 12/15 on amio gtt.  - Repeat DC-CV 12/22. Continue amio.  - Ranexa 500 bid added yesterday. Monitor renal function  - Not candidate for AVN ablation and CRT with chronic liver infection - continue Eliquis. No bleeding  3. AKI on CKD III - Creatinine baseline ~2.5 - Creatinine up today 2.8 - Continue pressors/inotropes.  - Goal is to keep MAP close to 70 with Co-ox 55% or greater.   - Follow renal function daily  4. H/O Ischemic R MCA , 2018  - hx of large R MCA with hemorrhagic conversion in Sept 2020 & L MCA s/p thrombectomy M2 occlusion in 2018. - no weakness on exam; continue with statin  5. Chronic cholecystitis with possible hepatic abscess - currently not surgical candidate - AF. WBC nl on admit  - [facility] has seen recently.   6. Hyponatremia - Improved to 130 -  continue diuresis. Avoid free water  7. DM2 - Glucose high 12/20 - A1c 4.8%  - on SSI - DM2 consult   Length of Stay: [address] PA-C  02/11/2020, 9:07 AM  Advanced Heart Failure Team Pager [phone] (M-F; 7a - 4p)  Please contact [facility] for night-coverage after hours (4p -7a ) and weekends on [email or web address]  Agree with above.   Underwent DC-CV yesterday for recurrent AF. Remains on NE and DBA. Co-ox back down to 40% today. CVP down to 10-11. Creatinine up slightly. Denies SOB, orthopnea or PND  General:  Lying in bed . No resp difficulty HEENT: normal Neck: supple. JVP 10. Carotids 2+ bilat; no bruits.  No lymphadenopathy or thryomegaly appreciated. Cor: PMI nondisplaced. Regular rate & rhythm. N+ s3 Lungs: clear Abdomen: soft, mildly tender RUQ, nondistended. No hepatosplenomegaly. No bruits or masses. Good bowel sounds. Extremities: no cyanosis, clubbing, rash, edema Neuro: alert & orientedx3, cranial nerves grossly intact. moves all 4 extremities w/o difficulty. Affect pleasant  Remains very tenuous. Back in NSR after repeat DC-CV yesterday but co-ox back down. CVP down to 10. Creatinine bumping. Will stop IV lasix and start torsemide 80 bid. Options very limited. [facility] consult Palliative Care for [place]. He is not candidate for advanced HF therapies.   CRITICAL CARE Performed by: Glori Bickers  Total critical care time: 35 minutes  Critical care time was exclusive of separately billable procedures and treating other patients.  Critical care was necessary to treat or prevent imminent or life-threatening deterioration.  Critical care was time spent personally by me (independent of midlevel providers or residents) on the following activities: development of treatment plan with patient and/or surrogate as well as nursing, discussions with consultants, evaluation of patient's response to treatment, examination of patient, obtaining history from patient or  surrogate, ordering and performing treatments and interventions, ordering and review of laboratory studies, ordering and review of radiographic studies, pulse oximetry and re-evaluation of patient's condition.  Glori Bickers, MD  11:29 AM

## 2020-02-12 LAB — BASIC METABOLIC PANEL
Anion gap: 13 (ref 5–15)
BUN: 39 mg/dL — ABNORMAL HIGH (ref 8–23)
CO2: 34 mmol/L — ABNORMAL HIGH (ref 22–32)
Calcium: 9.3 mg/dL (ref 8.9–10.3)
Chloride: 83 mmol/L — ABNORMAL LOW (ref 98–111)
Creatinine, Ser: 3.53 mg/dL — ABNORMAL HIGH (ref 0.61–1.24)
GFR, Estimated: 18 mL/min — ABNORMAL LOW (ref >60)
Glucose, Bld: 130 mg/dL — ABNORMAL HIGH (ref 70–99)
Potassium: 4 mmol/L (ref 3.5–5.1)
Sodium: 130 mmol/L — ABNORMAL LOW (ref 135–145)

## 2020-02-12 LAB — COOXEMETRY PANEL
Carboxyhemoglobin: 1.5 % (ref 0.5–1.5)
Methemoglobin: 0.8 % (ref 0.0–1.5)
O2 Saturation: 55.3 %
Total hemoglobin: 10.2 g/dL — ABNORMAL LOW (ref 12.0–16.0)

## 2020-02-12 LAB — GLUCOSE, CAPILLARY
Glucose-Capillary: 127 mg/dL — ABNORMAL HIGH (ref 70–99)
Glucose-Capillary: 132 mg/dL — ABNORMAL HIGH (ref 70–99)
Glucose-Capillary: 133 mg/dL — ABNORMAL HIGH (ref 70–99)
Glucose-Capillary: 140 mg/dL — ABNORMAL HIGH (ref 70–99)

## 2020-02-12 MED ORDER — FLUTICASONE PROPIONATE 50 MCG/ACT NA SUSP
1.0000 | Freq: Every day | NASAL | Status: DC
  Administered 2020-02-12 – 2020-02-14 (×3): 1 via NASAL
  Filled 2020-02-12: qty 16

## 2020-02-12 NOTE — Consult Note (Signed)
Palliative Medicine Inpatient Consult Note  Reason for consult:  Goals of Care  HPI:  Per intake H&P --> Mr Wurzer is a45 y.o.malewith a hx of chronic systolic HF 2/2 NICM s/pBSciICD,persistentatrial fibrillation, h/o stroke (Lg L MCA infarct w/ hemorrhagic conversionin 2018),HTN, HCV,tobacco abuse (smokes ~1/4 ppd), and chronic cholecystitiswith achronic perc drain.  Palliative care was asked to get involved in the setting of advanced disease with - he is not a candidate for advance therapies due to his poor functional state.  Mikhael is known to the Palliative care team. He had been seen in both May and November of this year. Through not review he has been against DNR code status though he would not wish for long term supportive measures.   Advance Directives were completed in May though patient declined completion of a MOST form in November. OP Palliative support was recommended though per chart review I do not see any active involvement. He is listed as not active with [facility] though I am hopeful that despite this he could be considered for [facility] OP Palliative services. I suspect this could be greatly helpful at home symptom management to prevent frequent ER visits and re-hospitalizations.   Clinical Assessment/Goals of Care: I have reviewed medical records including EPIC notes, labs and imaging, received report from bedside RN, assessed the patient who had just completed breakfast. He was lying in bed and asked for some help organizing his ".    I met with [name] Kielty to further discuss diagnosis prognosis, GOC, EOL wishes, disposition and options.   I introduced Palliative Medicine as specialized medical care for people living with serious illness. It focuses on providing relief from the symptoms and stress of a serious illness. The goal is to improve quality of life for both the patient and the family.  Levin shares that he is from [place], [place].  He has lived in a few different places throughout his life inclusive of [place], [place], [place], and [place].  He is not married though he does have a son named Medical sales representative.  He used to work as a Dealer and shares that he misses this very much.  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  From a functional perspective Adib had suffered a stroke in 2018 which left him with some impairments on his left side.  He shares that he has a difficult time buttoning shirts pulling up his pants and putting belts on.  He is presently living in an apartment home with his son who aids in supporting these basic activities of daily living.  He does utilize a cane for mobility purposes.  He is no longer driving.  A detailed discussion was had today regarding advanced directives he has had completed these in May of this year and they are on file in our [place] system.    I asked Zyshonne what he understood about his congestive heart failure.  He shares with me that he know it "ain't good".  I asked him to tell me more about that.  He describes profound shortness of breath with the task is easy is taking a drink in his home.  He shares that he is no longer able to do the things he used to do due to symptom burden most notably dyspnea.  We discussed that congestive heart failure is a progressive disease and that it will get to a point and  is at a point where there were no additional invasive treatment measures that can be pursued.  He expressed understanding of this.  Concepts specific to code status, artifical feeding and hydration, continued IV antibiotics and rehospitalization was had. I completed a MOST form today. The patient and family outlined their wishes for the following treatment decisions:  Cardiopulmonary Resuscitation: Do Not Attempt Resuscitation (DNR/No CPR)  Medical Interventions: Limited Additional Interventions: Use  medical treatment, IV fluids and cardiac monitoring as indicated, DO NOT USE intubation or mechanical ventilation. May consider use of less invasive airway support such as BiPAP or CPAP. Also provide comfort measures. Transfer to the hospital if indicated. Avoid intensive care.   Antibiotics: Determine use of limitation of antibiotics when infection occurs  IV Fluids: IV fluids for a defined trial period  Feeding Tube: Feeding tube for a defined trial period   The difference between a aggressive medical intervention path  and a palliative comfort care path for this patient at this time was had.  I described hospice as a service for patients for have a life expectancy of < 6 months. It preserves dignity and quality at the end phases of life. The focus changes from curative to symptom relief.  We discussed that given the severity of [place] disease hospice is more than reasonable consideration.  We also talked about if he does not feel ready for hospice considering outpatient palliative care as an additional layer of support.  He was very open to this though he did request I call his cousin Arnette Norris to talk further about it.  Discussed the importance of continued conversation with family and their  medical providers regarding overall plan of care and treatment options, ensuring decisions are within the context of the patients values and GOCs. ____________________________________________________________ Addendum: I reached out to patient's cousin [name] I explained the conversation as above.  I shared with [name] that Marquail has end-stage heart failure and there were no additional invasive therapies that are to be offered.  I shared that the goal as of right now is to optimize his condition so that he may be stable enough to get home.  I did discuss hospice with Arnette Norris and shared that this should be an ongoing discussion between he and Mayra is a do feel he will end up needing to go in this direction.  The  expresses thanks for my calling and shares he will be present in the hospital tomorrow.  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   Discharge Planning: Discharge will likely be to home. Will need close follow up with Palliative Team.  Vitals:   02/12/20 0300 02/12/20 0400  BP: 110/61 108/67  Pulse:    Resp: 13 19  Temp:  SpO2: 98% 100%    Intake/Output Summary (Last 24 hours) at 02/12/2020 7543 Last data filed at 02/12/2020 0400 Gross per 24 hour  Intake 1706.9 ml  Output 2150 ml  Net -443.1 ml   Last Weight  Most recent update: 02/12/2020  6:34 AM   Weight  73.9 kg (162 lb 14.7 oz)           Gen:  Older AA M in NAD HEENT: moist mucous membranes CV: Regular rate and irregular rhythm, no murmurs rubs or gallops PULM: clear to auscultation bilaterally  ABD: soft/nontender  EXT: No edema  Neuro: Alert and oriented x4  PPS: 50%    Time In: 0650 Time Out: 0800 Total Time: 70 Greater than 50%  of this time was spent counseling and coordinating care related to the above assessment and plan.  [place] Team Team Cell Phone: [phone] Please utilize secure chat with additional questions, if there is no response within 30 minutes please call the above phone number  Palliative Medicine Team providers are available by phone from 7am to 7pm daily and can be reached through the team cell phone.  Should this patient require assistance outside of these hours, please call the patient's attending physician.

## 2020-02-12 NOTE — Plan of Care (Signed)

## 2020-02-12 NOTE — Progress Notes (Signed)
Advanced Heart Failure Rounding Note  PCP-Cardiologist: Virl Axe, MD   Subjective:    - 12/14 Admitted with a/c systolic heart failure and A fib RVR. On dobutamine 3 mcg chronically.  - 12/15 converted back to NSR on amio gtt  - 12/16 Co-ox dropped to 24%. Lactic acid 2.4. DBA increased to 5 and NE increased to 8.   - 12/23 DCCV for AF   He remains in NSR this morning.   On NE 8, DBA 5. Lasix stopped 12/23.   Feels ok. Denies SOB, CP   SCr trending up, 2.69>>2.84> 3.5  Objective:   Weight Range: 73.9 kg Body mass index is 25.52 kg/m.   Vital Signs:   Temp:  [97.4 F (36.3 C)-98 F (36.7 C)] 97.6 F (36.4 C) (12/24 1557) Pulse Rate:  [73] 73 (12/24 0800) Resp:  [9-26] 17 (12/24 1800) BP: (88-125)/(50-78) 119/78 (12/24 1800) SpO2:  [90 %-100 %] 100 % (12/24 1800) Weight:  [73.9 kg] 73.9 kg (12/24 0500) Last BM Date: 02/13/2020  Weight change: Filed Weights   02/14/2020 0500 02/11/20 0537 02/12/20 0500  Weight: 75.1 kg 72.8 kg 73.9 kg    Intake/Output:   Intake/Output Summary (Last 24 hours) at 02/12/2020 1822 Last data filed at 02/12/2020 1804 Gross per 24 hour  Intake 1341.43 ml  Output 1550 ml  Net -208.57 ml      Physical Exam   CVP 13 General:  Lying in bed. No resp difficulty HEENT: normal Neck: supple. JVP to jaw  Carotids 2+ bilat; no bruits. No lymphadenopathy or thryomegaly appreciated. Cor: PMI nondisplaced. Regular rate & rhythm. +s3Lungs: clear Abdomen: soft, mildly tender in RUQ, nondistended. No hepatosplenomegaly. No bruits or masses. Good bowel sounds. Extremities: no cyanosis, clubbing, rash, edema Neuro: alert & orientedx3, cranial nerves grossly intact. moves all 4 extremities w/o difficulty. Affect pleasant   Telemetry   NSR 70s  Personally reviewed.   Labs    CBC Recent Labs    01/20/2020 0248  WBC 5.7  HGB 9.1*  HCT 28.8*  MCV 89.4  PLT 017   Basic Metabolic Panel Recent Labs    02/11/20 0920 02/12/20 0400   NA 130* 130*  K 3.6 4.0  CL 83* 83*  CO2 33* 34*  GLUCOSE 239* 130*  BUN 32* 39*  CREATININE 2.84* 3.53*  CALCIUM 8.9 9.3   Liver Function Tests No results for input(s): AST, ALT, ALKPHOS, BILITOT, PROT, ALBUMIN in the last 72 hours. No results for input(s): LIPASE, AMYLASE in the last 72 hours. Cardiac Enzymes No results for input(s): CKTOTAL, CKMB, CKMBINDEX, TROPONINI in the last 72 hours.  BNP: BNP (last 3 results) Recent Labs    06/23/19 1527 12/25/19 1757 01/23/2020 1745  BNP 613.2* 776.7* 1,014.9*    ProBNP (last 3 results) No results for input(s): PROBNP in the last 8760 hours.   D-Dimer No results for input(s): DDIMER in the last 72 hours. Hemoglobin A1C No results for input(s): HGBA1C in the last 72 hours. Fasting Lipid Panel No results for input(s): CHOL, HDL, LDLCALC, TRIG, CHOLHDL, LDLDIRECT in the last 72 hours. Thyroid Function Tests No results for input(s): TSH, T4TOTAL, T3FREE, THYROIDAB in the last 72 hours.  Invalid input(s): FREET3  Other results:   Imaging    No results found.   Medications:     Scheduled Medications: . apixaban  5 mg Oral BID  . atorvastatin  40 mg Oral QPM  . Chlorhexidine Gluconate Cloth  6 each Topical Daily  .  fluticasone  1 spray Each Nare Daily  . gabapentin  300 mg Oral BID  . insulin aspart  0-9 Units Subcutaneous TID WC  . pantoprazole  40 mg Oral Daily  . ranolazine  500 mg Oral BID  . sodium chloride flush  10-40 mL Intracatheter Q12H    Infusions: . amiodarone 30 mg/hr (02/12/20 1804)  . DOBUTamine 5 mcg/kg/min (02/12/20 1800)  . norepinephrine (LEVOPHED) Adult infusion 8 mcg/min (02/12/20 1800)    PRN Medications: acetaminophen, albuterol, ALPRAZolam, calcium carbonate, nitroGLYCERIN, ondansetron (ZOFRAN) IV, sodium chloride flush, zolpidem    Patient Profile   Glenn [name] is a 69 y.o.malewith a hx of chronic systolic HF 2/2 NICM s/pBSciICD,persistentatrial fibrillation, h/o  stroke (Lg L MCA infarct w/ hemorrhagic conversionin 2018),HTN, HCV,tobacco abuse (smokes ~1/4 ppd), and chronic cholecystitiswith achronic perc drain.      Assessment/Plan   1. A/C Systolic Heart Failure -> cardiogenic shock - [facility] 10/2018 showed normal cors, elevated filling pressures and low CI at 1.9 - TTE10/2020 LVEF 20%, mild RV dysfunction - On home dobutamine 3 mcg.  - Co-ox dropped to 24% on 12/16 and inotropes increased - On DBA 5 + NE 8 and lasix 30 gtt. Co-ox 55% - Continue inotropes at current rate but creatinine continues to climb - No bb/spiro/dig/arini with elevated creatinine.  - We have consulted Palliative Care and he seems very realistic about his situation.  - Now DNR/DNI  2. Atrial fib, RVR - Most recent DC-CV 01/06/2020  - Converted to NSR on 12/15 on amio gtt.  - Repeat DC-CV 12/22. Continue amio.  - Ranexa 500 bid added yesterday. Monitor renal function  - Not candidate for AVN ablation and CRT with chronic liver infection - Continue Eliquis. No bleeding.   3. AKI on CKD III - Creatinine baseline ~2.5 - Creatinine up today to 3,3 - Continue pressors/inotropes.  - Goal is to keep MAP close to 70 with Co-ox 55% or greater.   - Follow renal function daily. Not candidate for HD with end-stage HF  4. H/O Ischemic R MCA , 2018  - hx of large R MCA with hemorrhagic conversion in Sept 2020 & L MCA s/p thrombectomy M2 occlusion in 2018. - no weakness on exam; continue with statin  5. Chronic cholecystitis with possible hepatic abscess - currently not surgical candidate - AF. WBC nl on admit  - [facility] has seen recently.   6. Hyponatremia - Stable at 130 - continue diuresis. Avoid free water  7. DM2 - Glucose high 12/20 - A1c 4.8%  - on SSI  8. DNR/DNI  CRITICAL CARE Performed by: Glenn [name]  Total critical care time: 35 minutes  Critical care time was exclusive of separately billable procedures and treating other  patients.  Critical care was necessary to treat or prevent imminent or life-threatening deterioration.  Critical care was time spent personally by me (independent of midlevel providers or residents) on the following activities: development of treatment plan with patient and/or surrogate as well as nursing, discussions with consultants, evaluation of patient's response to treatment, examination of patient, obtaining history from patient or surrogate, ordering and performing treatments and interventions, ordering and review of laboratory studies, ordering and review of radiographic studies, pulse oximetry and re-evaluation of patient's condition.    Length of Stay: [place], MD  02/12/2020, 6:22 PM  Advanced Heart Failure Team Pager [phone] (M-F; [place])  Please contact [facility] for night-coverage after hours (4p -7a ) and weekends on [email or web address]

## 2020-02-13 LAB — COOXEMETRY PANEL
Carboxyhemoglobin: 1.2 % (ref 0.5–1.5)
Methemoglobin: 0.8 % (ref 0.0–1.5)
O2 Saturation: 40.6 %
Total hemoglobin: 9.8 g/dL — ABNORMAL LOW (ref 12.0–16.0)

## 2020-02-13 LAB — BASIC METABOLIC PANEL
Anion gap: 12 (ref 5–15)
BUN: 43 mg/dL — ABNORMAL HIGH (ref 8–23)
CO2: 32 mmol/L (ref 22–32)
Calcium: 9 mg/dL (ref 8.9–10.3)
Chloride: 83 mmol/L — ABNORMAL LOW (ref 98–111)
Creatinine, Ser: 4.04 mg/dL — ABNORMAL HIGH (ref 0.61–1.24)
GFR, Estimated: 15 mL/min — ABNORMAL LOW (ref >60)
Glucose, Bld: 193 mg/dL — ABNORMAL HIGH (ref 70–99)
Potassium: 4 mmol/L (ref 3.5–5.1)
Sodium: 127 mmol/L — ABNORMAL LOW (ref 135–145)

## 2020-02-13 LAB — GLUCOSE, CAPILLARY
Glucose-Capillary: 127 mg/dL — ABNORMAL HIGH (ref 70–99)
Glucose-Capillary: 137 mg/dL — ABNORMAL HIGH (ref 70–99)
Glucose-Capillary: 185 mg/dL — ABNORMAL HIGH (ref 70–99)
Glucose-Capillary: 115 mg/dL — ABNORMAL HIGH (ref 70–99)

## 2020-02-13 NOTE — Progress Notes (Signed)
Advanced Heart Failure Rounding Note  PCP-Cardiologist: Glenn Axe, MD   Subjective:    - 12/14 Admitted with a/c systolic heart failure and A fib RVR. On dobutamine 3 mcg chronically.  - 12/15 converted back to NSR on amio gtt  - 12/16 Co-ox dropped to 24%. Lactic acid 2.4. DBA increased to 5 and NE increased to 8.   - 12/23 DCCV for AF   He remains in NSR this morning.   On NE 8, DBA 5. Co-ox 41% Lasix stopped 12/23. CVP up to 16-17  Feels ok. Denies CP, SOB, orthopnea or PND.   SCr trending up, 2.69>>2.84> 3.5 > 4.0  Objective:   Weight Range: 74.4 kg Body mass index is 25.69 kg/m.   Vital Signs:   Temp:  [97.4 F (36.3 C)-98.9 F (37.2 C)] 97.6 F (36.4 C) (12/25 0801) Pulse Rate:  [72] 72 (12/25 0800) Resp:  [9-26] 17 (12/25 0800) BP: (88-127)/(50-78) 95/62 (12/25 0800) SpO2:  [90 %-100 %] 100 % (12/25 0800) Weight:  [74.4 kg] 74.4 kg (12/25 0600) Last BM Date: 01/28/2020  Weight change: Filed Weights   02/11/20 0537 02/12/20 0500 02/13/20 0600  Weight: 72.8 kg 73.9 kg 74.4 kg    Intake/Output:   Intake/Output Summary (Last 24 hours) at 02/13/2020 0857 Last data filed at 02/13/2020 0700 Gross per 24 hour  Intake 1153.25 ml  Output 1000 ml  Net 153.25 ml      Physical Exam   General:  Weak appearing. No resp difficulty HEENT: normal Neck: supple. JVP to ear Carotids 2+ bilat; no bruits. No lymphadenopathy or thryomegaly appreciated. Cor: PMI nondisplaced. Regular rate & rhythm.+s3 Lungs: clear Abdomen: soft, nontender, nondistended. No hepatosplenomegaly. No bruits or masses. Good bowel sounds. Extremities: no cyanosis, clubbing, rash, trace edema Neuro: alert & orientedx3, cranial nerves grossly intact. moves all 4 extremities w/o difficulty. Affect pleasant   Telemetry   NSR 70s Personally reviewed   Labs    CBC No results for input(s): WBC, NEUTROABS, HGB, HCT, MCV, PLT in the last 72 hours. Basic Metabolic Panel Recent Labs     02/12/20 0400 02/13/20 0500  NA 130* 127*  K 4.0 4.0  CL 83* 83*  CO2 34* 32  GLUCOSE 130* 193*  BUN 39* 43*  CREATININE 3.53* 4.04*  CALCIUM 9.3 9.0   Liver Function Tests No results for input(s): AST, ALT, ALKPHOS, BILITOT, PROT, ALBUMIN in the last 72 hours. No results for input(s): LIPASE, AMYLASE in the last 72 hours. Cardiac Enzymes No results for input(s): CKTOTAL, CKMB, CKMBINDEX, TROPONINI in the last 72 hours.  BNP: BNP (last 3 results) Recent Labs    06/23/19 1527 12/25/19 1757 02/03/2020 1745  BNP 613.2* 776.7* 1,014.9*    ProBNP (last 3 results) No results for input(s): PROBNP in the last 8760 hours.   D-Dimer No results for input(s): DDIMER in the last 72 hours. Hemoglobin A1C No results for input(s): HGBA1C in the last 72 hours. Fasting Lipid Panel No results for input(s): CHOL, HDL, LDLCALC, TRIG, CHOLHDL, LDLDIRECT in the last 72 hours. Thyroid Function Tests No results for input(s): TSH, T4TOTAL, T3FREE, THYROIDAB in the last 72 hours.  Invalid input(s): FREET3  Other results:   Imaging    No results found.   Medications:     Scheduled Medications: . apixaban  5 mg Oral BID  . atorvastatin  40 mg Oral QPM  . Chlorhexidine Gluconate Cloth  6 each Topical Daily  . fluticasone  1 spray Each Nare  Daily  . gabapentin  300 mg Oral BID  . insulin aspart  0-9 Units Subcutaneous TID WC  . pantoprazole  40 mg Oral Daily  . ranolazine  500 mg Oral BID  . sodium chloride flush  10-40 mL Intracatheter Q12H    Infusions: . amiodarone 30 mg/hr (02/13/20 0731)  . DOBUTamine 5 mcg/kg/min (02/13/20 0700)  . norepinephrine (LEVOPHED) Adult infusion 8 mcg/min (02/13/20 0700)    PRN Medications: acetaminophen, albuterol, ALPRAZolam, calcium carbonate, nitroGLYCERIN, ondansetron (ZOFRAN) IV, sodium chloride flush, zolpidem    Patient Profile   [name] is a 69 y.o.malewith a hx of chronic systolic HF 2/2 NICM  s/pBSciICD,persistentatrial fibrillation, h/o stroke (Lg L MCA infarct w/ hemorrhagic conversionin 2018),HTN, HCV,tobacco abuse (smokes ~1/4 ppd), and chronic cholecystitiswith achronic perc drain.    Assessment/Plan   1. A/C Systolic Heart Failure -> cardiogenic shock - [facility] 10/2018 showed normal cors, elevated filling pressures and low CI at 1.9 - TTE10/2020 LVEF 20%, mild RV dysfunction - On home dobutamine 3 mcg.  - Co-ox dropped to 24% on 12/16 and inotropes increased - On DBA 5 + NE 8. Co-ox 41% - Continue inotropes at current rate but BUN/creatinine continues to climb. Suspect he will not recover from this.  - We have consulted Palliative Care and he seems very realistic about his situation.  - Now DNR/DNI. We discussed Hospice - He states his cousin, Glenn [name] would be decision-maker for him if he can't make them  2. Atrial fib, RVR - Most recent DC-CV 01/06/2020  - Converted to NSR on 12/15 on amio gtt.  - Repeat DC-CV 12/22. Continue amio.  - Remains in NSR. Stop Ranexa due to AKI - Not candidate for AVN ablation and CRT with chronic liver infection - Continue Eliquis. No bleeding.   3. AKI on CKD III - Creatinine baseline ~2.5 - Creatinine up today to 4.04 - Continue pressors/inotropes.  - Goal is to keep MAP close to 70 with Co-ox 55% or greater.   - Follow renal function daily. Not candidate for HD with end-stage HF - Plan as above  4. H/O Ischemic R MCA , 2018  - hx of large R MCA with hemorrhagic conversion in Sept 2020 & L MCA s/p thrombectomy M2 occlusion in 2018. - no weakness on exam; continue with statin  5. Chronic cholecystitis with possible hepatic abscess - currently not surgical candidate - AF. WBC nl on admit  - [facility] has seen recently.   6. Hyponatremia - 127 today  - restrict free water  7. DM2 - Glucose high 12/20 - A1c 4.8%  - on SSI  8. DNR/DNI  CRITICAL CARE Performed by: Glenn [name]  Total critical care time: 40  minutes  Critical care time was exclusive of separately billable procedures and treating other patients.  Critical care was necessary to treat or prevent imminent or life-threatening deterioration.  Critical care was time spent personally by me (independent of midlevel providers or residents) on the following activities: development of treatment plan with patient and/or surrogate as well as nursing, discussions with consultants, evaluation of patient's response to treatment, examination of patient, obtaining history from patient or surrogate, ordering and performing treatments and interventions, ordering and review of laboratory studies, ordering and review of radiographic studies, pulse oximetry and re-evaluation of patient's condition.    Length of Stay: [place], MD  02/13/2020, 8:57 AM  Advanced Heart Failure Team Pager [phone] (M-F; 7a - 4p)  Please contact [facility] for night-coverage  after hours (4p -7a ) and weekends on [email or web address]

## 2020-02-14 DIAGNOSIS — Z7189 Other specified counseling: Secondary | ICD-10-CM

## 2020-02-14 DIAGNOSIS — Z515 Encounter for palliative care: Secondary | ICD-10-CM | POA: Diagnosis not present

## 2020-02-14 LAB — BASIC METABOLIC PANEL
Anion gap: 15 (ref 5–15)
BUN: 50 mg/dL — ABNORMAL HIGH (ref 8–23)
CO2: 30 mmol/L (ref 22–32)
Calcium: 9 mg/dL (ref 8.9–10.3)
Chloride: 83 mmol/L — ABNORMAL LOW (ref 98–111)
Creatinine, Ser: 4.26 mg/dL — ABNORMAL HIGH (ref 0.61–1.24)
GFR, Estimated: 14 mL/min — ABNORMAL LOW (ref >60)
Glucose, Bld: 105 mg/dL — ABNORMAL HIGH (ref 70–99)
Potassium: 3.7 mmol/L (ref 3.5–5.1)
Sodium: 128 mmol/L — ABNORMAL LOW (ref 135–145)

## 2020-02-14 LAB — COOXEMETRY PANEL
Carboxyhemoglobin: 1.7 % — ABNORMAL HIGH (ref 0.5–1.5)
Methemoglobin: 1 % (ref 0.0–1.5)
O2 Saturation: 54.9 %
Total hemoglobin: 9.4 g/dL — ABNORMAL LOW (ref 12.0–16.0)

## 2020-02-14 LAB — GLUCOSE, CAPILLARY
Glucose-Capillary: 119 mg/dL — ABNORMAL HIGH (ref 70–99)
Glucose-Capillary: 134 mg/dL — ABNORMAL HIGH (ref 70–99)

## 2020-02-14 MED ORDER — FUROSEMIDE 10 MG/ML IJ SOLN
160.0000 mg | Freq: Once | INTRAVENOUS | Status: DC
  Filled 2020-02-14: qty 16

## 2020-02-14 MED ORDER — SODIUM CHLORIDE 0.9 % IV SOLN
1.0000 mg/h | INTRAVENOUS | Status: DC
  Administered 2020-02-14: 22:00:00 3 mg/h via INTRAVENOUS
  Administered 2020-02-14: 13:00:00 2 mg/h via INTRAVENOUS
  Administered 2020-02-15 (×2): 3 mg/h via INTRAVENOUS
  Filled 2020-02-14 (×6): qty 2.5

## 2020-02-14 MED ORDER — BISACODYL 10 MG RE SUPP: 10.0000 mg | Freq: Every day | RECTAL | Status: DC | PRN

## 2020-02-14 MED ORDER — HYDROMORPHONE BOLUS VIA INFUSION
1.0000 mg | INTRAVENOUS | Status: DC | PRN
Start: 2020-02-14 — End: 2020-02-15
  Administered 2020-02-15 (×3): 1 mg via INTRAVENOUS
  Filled 2020-02-14: qty 1

## 2020-02-14 MED ORDER — GLYCOPYRROLATE 0.2 MG/ML IJ SOLN
0.4000 mg | INTRAMUSCULAR | Status: DC | PRN
  Administered 2020-02-15: 10:00:00 0.4 mg via INTRAVENOUS
  Filled 2020-02-14: qty 2

## 2020-02-14 MED ORDER — LORAZEPAM 2 MG/ML IJ SOLN: 0.5000 mg | INTRAMUSCULAR | Status: DC | PRN

## 2020-02-14 NOTE — Progress Notes (Addendum)
Palliative Medicine Inpatient Follow Up Note  HPI: Per intake H&P --> Mr Heemstra is a87 y.o.malewith a hx of chronic systolic HF 2/2 NICM s/pBSciICD,persistentatrial fibrillation, h/o stroke (Lg L MCA infarct w/ hemorrhagic conversionin 2018),HTN, HCV,tobacco abuse (smokes ~1/4 ppd), and chronic cholecystitiswith achronic perc drain.  Palliative care was asked to get involved in the setting of advanced disease with - he is not a candidate for advance therapies due to his poor functional state.  Carlitos is known to the Palliative care team. He had been seen in both May and November of this year. Through not review he has been against DNR code status though he would not wish for long term supportive measures.   Advance Directives were completed in May though patient declined completion of a MOST form in November. OP Palliative support was recommended though per chart review I do not see any active involvement. He is listed as not active with [facility] though I am hopeful that despite this he could be considered for [facility] OP Palliative services. I suspect this could be greatly helpful at home symptom management to prevent frequent ER visits and re-hospitalizations.   Today's Discussion (02/14/2020): Family meeting held with Tyreak and his cousin, [name].   We reviewed [place]'s laboratory results in the context of his poor heart function. Kidneys are progressively worsening despite aggressive efforts for maintenance. He is not a candidate for hemodialysis in the setting of his end stage heart failure with an EF of 20%. We further discussed that based upon his progressive symptom review that he is at the end stages of the disease. I shared that we have two options. The first would be to continue the present course - hoping for improvement though realizing that he will likely be in a similar situation shortly. The second and recognizing he has the late phases of a disease  that is irreparable and shifting our focus to symptom relief.   We talked about transition to comfort measures in house and what that would entail inclusive of medications to control pain, dyspnea, agitation, nausea, itching, and hiccups.   We discussed stopping all uneccessary measures such as amiodarone gtt, dobutamine gtt, norepinephrine gtt, monitoring,  blood draws, needle sticks, and frequent vital signs. Zakariye shares that "there comes a point in every mans life that his life will end." He shares that the option of comfort care aligns with her personal goals. [name] is also in agreement with this. Ariston shares that going home is probably not a feasible option for him - I shared that I was sorry as I believe his time will be short and his care at home could be fairly intensive - his son would not be able to provide this.   We further discussions transition to an inpatient hospice home if her remains symptomatically stable. He shares that his family is in the [place] area therefore an inpatient hospice out there would be preferred.   Chaplain support offered and patient has requested a Bible.   Questions and concerns addressed   Objective Assessment: Vital Signs Vitals:   02/14/20 0915 02/14/20 1100  BP: 120/72 135/62  Pulse:    Resp: 14 (!) 9  Temp:    SpO2: 99% 98%    Intake/Output Summary (Last 24 hours) at 02/14/2020 1216 Last data filed at 02/14/2020 1101 Gross per 24 hour  Intake 1060.54 ml  Output 1130 ml  Net -69.46 ml   Last Weight  Most recent update: 02/14/2020  2:05  AM   Weight  74.7 kg (164 lb 10.9 oz)           Gen:  Older AA M in NAD HEENT: moist mucous membranes CV: Regular rate and irregular rhythm, no murmurs rubs or gallops PULM: clear to auscultation bilaterally  ABD: soft/nontender  EXT: No edema  Neuro: Alert and oriented x4  SUMMARY OF RECOMMENDATIONS DNAR/DNI  Transition focus to comfort  Comfort medications per [facility]  TOC -  Appreciate support trying to find a hospice home in the [place] area  Spiritual support patient is a [place] - He requests a Bible  Patient enjoys playing chess   Time Spent: 45 Greater than 50% of the time was spent in counseling and coordination of care ______________________________________________________________________________________ [place] Team Team Cell Phone: [phone] Please utilize secure chat with additional questions, if there is no response within 30 minutes please call the above phone number  Palliative Medicine Team providers are available by phone from 7am to 7pm daily and can be reached through the team cell phone.  Should this patient require assistance outside of these hours, please call the patient's attending physician.

## 2020-02-14 NOTE — Progress Notes (Signed)
Advanced Heart Failure Rounding Note  PCP-Cardiologist: Virl Axe, MD   Subjective:    - 12/14 Admitted with a/c systolic heart failure and A fib RVR. On dobutamine 3 mcg chronically.  - 12/15 converted back to NSR on amio gtt  - 12/16 Co-ox dropped to 24%. Lactic acid 2.4. DBA increased to 5 and NE increased to 8.   - 12/23 DCCV for AF   He remains in NSR this morning.   On NE 8, DBA 5. Co-ox 41% -> 55%  Lasix stopped 12/23. Weight stable. CVP up to 16-17  SCr trending up, 2.69>>2.84> 3.5 > 4.0 > 4.3  Objective:   Weight Range: 74.7 kg Body mass index is 25.79 kg/m.   Vital Signs:   Temp:  [97.4 F (36.3 C)-98.1 F (36.7 C)] 98 F (36.7 C) (12/26 0700) Pulse Rate:  [68-77] 72 (12/26 0800) Resp:  [12-28] 18 (12/26 0800) BP: (99-134)/(51-110) 113/68 (12/26 0800) SpO2:  [88 %-100 %] 93 % (12/26 0800) Weight:  [74.7 kg] 74.7 kg (12/26 0205) Last BM Date: 02/03/2020  Weight change: Filed Weights   02/12/20 0500 02/13/20 0600 02/14/20 0205  Weight: 73.9 kg 74.4 kg 74.7 kg    Intake/Output:   Intake/Output Summary (Last 24 hours) at 02/14/2020 0822 Last data filed at 02/14/2020 0659 Gross per 24 hour  Intake 1230.41 ml  Output 950 ml  Net 280.41 ml      Physical Exam   General:  Weak appearing. No resp difficulty HEENT: normal Neck: supple. JVP to ear. Carotids 2+ bilat; no bruits. No lymphadenopathy or thryomegaly appreciated. Cor: PMI nondisplaced. Regular rate & rhythm. +s3 Lungs: clear Abdomen: soft, mildly tender RUQ, nondistended. No hepatosplenomegaly. No bruits or masses. Good bowel sounds. Extremities: no cyanosis, clubbing, rash, trace edema Neuro: alert & orientedx3, cranial nerves grossly intact. moves all 4 extremities w/o difficulty. Affect pleasant   Telemetry   NSR 70-80 Personally reviewed   Labs    CBC No results for input(s): WBC, NEUTROABS, HGB, HCT, MCV, PLT in the last 72 hours. Basic Metabolic Panel Recent Labs     02/13/20 0500 02/14/20 0208  NA 127* 128*  K 4.0 3.7  CL 83* 83*  CO2 32 30  GLUCOSE 193* 105*  BUN 43* 50*  CREATININE 4.04* 4.26*  CALCIUM 9.0 9.0   Liver Function Tests No results for input(s): AST, ALT, ALKPHOS, BILITOT, PROT, ALBUMIN in the last 72 hours. No results for input(s): LIPASE, AMYLASE in the last 72 hours. Cardiac Enzymes No results for input(s): CKTOTAL, CKMB, CKMBINDEX, TROPONINI in the last 72 hours.  BNP: BNP (last 3 results) Recent Labs    06/23/19 1527 12/25/19 1757 01/23/2020 1745  BNP 613.2* 776.7* 1,014.9*    ProBNP (last 3 results) No results for input(s): PROBNP in the last 8760 hours.   D-Dimer No results for input(s): DDIMER in the last 72 hours. Hemoglobin A1C No results for input(s): HGBA1C in the last 72 hours. Fasting Lipid Panel No results for input(s): CHOL, HDL, LDLCALC, TRIG, CHOLHDL, LDLDIRECT in the last 72 hours. Thyroid Function Tests No results for input(s): TSH, T4TOTAL, T3FREE, THYROIDAB in the last 72 hours.  Invalid input(s): FREET3  Other results:   Imaging    No results found.   Medications:     Scheduled Medications: . apixaban  5 mg Oral BID  . atorvastatin  40 mg Oral QPM  . Chlorhexidine Gluconate Cloth  6 each Topical Daily  . fluticasone  1 spray Each Nare  Daily  . gabapentin  300 mg Oral BID  . insulin aspart  0-9 Units Subcutaneous TID WC  . pantoprazole  40 mg Oral Daily  . sodium chloride flush  10-40 mL Intracatheter Q12H    Infusions: . amiodarone 30 mg/hr (02/14/20 0739)  . DOBUTamine 5 mcg/kg/min (02/14/20 0740)  . norepinephrine (LEVOPHED) Adult infusion 8 mcg/min (02/14/20 0659)    PRN Medications: acetaminophen, albuterol, ALPRAZolam, calcium carbonate, nitroGLYCERIN, ondansetron (ZOFRAN) IV, sodium chloride flush, zolpidem    Patient Profile   Glenn [name] is a 69 y.o.malewith a hx of chronic systolic HF 2/2 NICM s/pBSciICD,persistentatrial fibrillation, h/o stroke  (Lg L MCA infarct w/ hemorrhagic conversionin 2018),HTN, HCV,tobacco abuse (smokes ~1/4 ppd), and chronic cholecystitiswith achronic perc drain.    Assessment/Plan   1. A/C Systolic Heart Failure -> cardiogenic shock - [facility] 10/2018 showed normal cors, elevated filling pressures and low CI at 1.9 - TTE10/2020 LVEF 20%, mild RV dysfunction - On home dobutamine 3 mcg.  - Co-ox dropped to 24% on 12/16 and inotropes increased - On DBA 5 + NE 8. Co-ox 55% - Continue inotropes at current rate but BUN/creatinine continues to climb. Suspect he will not recover from this.  - We have consulted Palliative Care and he seems very realistic about his situation. Now DNR/DNI. We discussed Hospice. Family meeting today. He states his cousin, Glenn [name] would be decision-maker for him if he can't make them  2. Atrial fib, RVR - Most recent DC-CV 01/06/2020  - Converted to NSR on 12/15 on amio gtt.  - Repeat DC-CV 12/22. Continue amio.  - Remains in NSR. Stop Ranexa due to AKI - Not candidate for AVN ablation and CRT with chronic liver infection and advanced renal failure - Continue Eliquis. No bleeding.   3. AKI on CKD III - Creatinine baseline ~2.5 - Creatinine up today to 4.26 - Continue pressors/inotropes.  - Goal is to keep MAP close to 70 with Co-ox 55% or greater.   - Follow renal function daily. Not candidate for HD with end-stage HF - Plan as above regarding Hospice  4. H/O Ischemic R MCA , 2018  - hx of large R MCA with hemorrhagic conversion in Sept 2020 & L MCA s/p thrombectomy M2 occlusion in 2018. - no weakness on exam; continue with statin  5. Chronic cholecystitis with possible hepatic abscess - currently not surgical candidate - AF. WBC nl on admit  - [facility] has seen recently.   6. Hyponatremia - 128 today  - restrict free water  7. DM2 - Glucose high 12/20 - A1c 4.8%  - on SSI  8. DNR/DNI - family meeting today  CRITICAL CARE Performed by: Glori Bickers  Total critical care time: 35 minutes  Critical care time was exclusive of separately billable procedures and treating other patients.  Critical care was necessary to treat or prevent imminent or life-threatening deterioration.  Critical care was time spent personally by me (independent of midlevel providers or residents) on the following activities: development of treatment plan with patient and/or surrogate as well as nursing, discussions with consultants, evaluation of patient's response to treatment, examination of patient, obtaining history from patient or surrogate, ordering and performing treatments and interventions, ordering and review of laboratory studies, ordering and review of radiographic studies, pulse oximetry and re-evaluation of patient's condition.    Length of Stay: [place], MD  02/14/2020, 8:22 AM  Advanced Heart Failure Team Pager [phone] (M-F; 7a - 4p)  Please contact [facility]  for night-coverage after hours (4p -7a ) and weekends on [email or web address]

## 2020-02-14 NOTE — Progress Notes (Signed)
Palliative care reached out to me stating that Glenn [name] had a request for someone to play chess with while he is on comfort care on [place]. I borrowed a Counselling psychologist from the pediatrics unit and brought it to him.   Unfortunately, at the time that I showed up the patient was experiencing some nausea and vomiting, and he was not up for playing a game of chess.  I explained that I would try to come back and play a round of chess with him. If there is any staff that reads this note and are available to play chess with him, please honor his wish.

## 2020-02-14 NOTE — TOC Progression Note (Signed)
Transition of Care [facility]) - Progression Note    Patient Details  Name: OLAOLUWA GRIEDER MRN: [id number] Date of Birth: Apr 12, 1950  Transition of Care [facility]) CM/SW [place], [place] Phone Number: 02/14/2020, 2:32 PM  Clinical Narrative:     CSW Spoke with pt bedside. Pt stated he is interested in Hospice house in [place]. CSW called and sent referral to [facility]. Vance stated they do not have a bed today and for TOC to call daily.   Emeterio Reeve, Latanya Presser, [place] Clinical Social Worker [phone]   Expected Discharge Plan: [place] (Resides with son)    Expected Discharge Plan and Services Expected Discharge Plan: [place] (Resides with son)                                               Social Determinants of Health (SDOH) Interventions    Readmission Risk Interventions Readmission Risk Prevention Plan 07/15/2019 02/05/2019  Transportation Screening Complete Complete  Medication Review Press photographer) - Complete  PCP or Specialist appointment within 3-5 days of discharge - Complete  HRI or [place] - Complete  SW Recovery Care/Counseling Consult Complete Complete  Palliative Care Screening Complete Not [place] Complete Not Applicable  Some recent data might be hidden   Emeterio Reeve, Latanya Presser, [place] Worker [phone]

## 2020-02-14 NOTE — Plan of Care (Signed)

## 2020-02-14 NOTE — Plan of Care (Signed)
ICD device turned off by [employer] [facility])  today per order.

## 2020-02-14 NOTE — Plan of Care (Signed)

## 2020-02-14 NOTE — Progress Notes (Signed)
   02/14/20 1234  Clinical Encounter Type  Visited With Patient  Visit Type Initial  Referral From Nurse  Consult/Referral To Chaplain  Spiritual Encounters  Spiritual Needs Prayer;Emotional;Literature  Stress Factors  Patient Stress Factors Health changes;Major life changes  Family Stress Factors None identified  Nurse called Chaplain and relayed patient's request for a Bible. Chaplain also saw that he was on the consult list yesterday for a Bible and prayer.  Chaplain delivered Bible and offered prayer.

## 2020-02-15 MED ORDER — GLYCOPYRROLATE 0.2 MG/ML IJ SOLN: 0.4000 mg | INTRAMUSCULAR | Status: DC | PRN

## 2020-02-15 MED ORDER — LORAZEPAM 2 MG/ML IJ SOLN
1.0000 mg | INTRAMUSCULAR | Status: DC | PRN
  Administered 2020-02-15: 13:00:00 1 mg via INTRAVENOUS
  Filled 2020-02-15: qty 1

## 2020-02-15 MED ORDER — GLYCOPYRROLATE 0.2 MG/ML IJ SOLN
0.4000 mg | INTRAMUSCULAR | Status: DC
  Administered 2020-02-15 – 2020-02-16 (×4): 0.4 mg via INTRAVENOUS
  Filled 2020-02-15 (×4): qty 2

## 2020-02-15 MED ORDER — HYDROMORPHONE BOLUS VIA INFUSION
2.0000 mg | INTRAVENOUS | Status: DC | PRN
  Filled 2020-02-15: qty 2

## 2020-02-15 MED ORDER — FUROSEMIDE 10 MG/ML IJ SOLN
120.0000 mg | Freq: Once | INTRAVENOUS | Status: AC
  Administered 2020-02-15: 13:00:00 120 mg via INTRAVENOUS
  Filled 2020-02-15: qty 10

## 2020-02-15 NOTE — Care Management Important Message (Signed)
Important Message  Patient Details  Name: Glenn [name] MRN: [id number] Date of Birth: 03/06/1950   Medicare Important Message Given:  Yes     [name] 02/15/2020, 2:45 PM

## 2020-02-15 NOTE — Progress Notes (Signed)
    Advanced Heart Failure Rounding Note   Subjective:    Now on comfort care. On dilaudid gtt at 2. Unresponsive but having some gurgling on exam    Objective:   Weight Range:  Vital Signs:   Temp:  [99.5 F (37.5 C)] 99.5 F (37.5 C) (12/27 0138) Pulse Rate:  [85] 85 (12/27 0138) Resp:  [5-17] 17 (12/27 0138) BP: (120-135)/(62-79) 128/79 (12/27 0138) SpO2:  [95 %-99 %] 95 % (12/27 0138) Last BM Date: 02/14/20  Weight change: Filed Weights   02/12/20 0500 02/13/20 0600 02/14/20 0205  Weight: 73.9 kg 74.4 kg 74.7 kg    Intake/Output:   Intake/Output Summary (Last 24 hours) at 02/15/2020 0845 Last data filed at 02/15/2020 0000 Gross per 24 hour  Intake 176.86 ml  Output 580 ml  Net -403.14 ml     Physical Exam: General:  Unresponsive. Breathing somewhat agonal + gurgling HEENT: normal Neck: supple. JVP to ear . Carotids 2+ bilat; no bruits. No lymphadenopathy or thryomegaly appreciated. Cor: PMI nondisplaced. Regular +s3 Lungs: coarse Abdomen: soft, nontender, nondistended. No hepatosplenomegaly. No bruits or masses. Good bowel sounds. Extremities: no cyanosis, clubbing, rash, edema Neuro:unresponsive   Labs: Basic Metabolic Panel: Recent Labs  Lab 01/29/2020 0248 02/11/20 0920 02/12/20 0400 02/13/20 0500 02/14/20 0208  NA 135 130* 130* 127* 128*  K 4.1 3.6 4.0 4.0 3.7  CL 91* 83* 83* 83* 83*  CO2 32 33* 34* 32 30  GLUCOSE 105* 239* 130* 193* 105*  BUN 30* 32* 39* 43* 50*  CREATININE 2.69* 2.84* 3.53* 4.04* 4.26*  CALCIUM 8.9 8.9 9.3 9.0 9.0    Liver Function Tests: No results for input(s): AST, ALT, ALKPHOS, BILITOT, PROT, ALBUMIN in the last 168 hours. No results for input(s): LIPASE, AMYLASE in the last 168 hours. No results for input(s): AMMONIA in the last 168 hours.  CBC: Recent Labs  Lab 02/09/20 0816 02/05/2020 0248  WBC 6.3 5.7  HGB 9.6* 9.1*  HCT 28.8* 28.8*  MCV 88.6 89.4  PLT 256 225    Cardiac Enzymes: No results for  input(s): CKTOTAL, CKMB, CKMBINDEX, TROPONINI in the last 168 hours.  BNP: BNP (last 3 results) Recent Labs    06/23/19 1527 12/25/19 1757 02/18/2020 1745  BNP 613.2* 776.7* 1,014.9*    ProBNP (last 3 results) No results for input(s): PROBNP in the last 8760 hours.    Other results:  Imaging:  No results found.   Medications:     Scheduled Medications: . fluticasone  1 spray Each Nare Daily  . gabapentin  300 mg Oral BID     Infusions: . furosemide    . HYDROmorphone 1 mg/hr (02/15/20 0733)     PRN Medications:  acetaminophen, albuterol, bisacodyl, calcium carbonate, glycopyrrolate, HYDROmorphone, LORazepam, ondansetron (ZOFRAN) IV, zolpidem   Assessment/Plan   1. End-stage systolic HF 2. Persistent AF 3. AKI  Now on full comfort care on dilaudid gtt. Remains unresponsive though having some gurgling on exam. Increase dilaudid as needed. Suspect he will pass today. Appreciate Palliative Care's support    Length of Stay: 13   Glori Bickers MD 02/15/2020, 8:45 AM  Advanced Heart Failure Team Pager [phone] (M-F; [place])  Please contact [facility] for night-coverage after hours (4p -7a ) and weekends on [email or web address]

## 2020-02-15 NOTE — Progress Notes (Addendum)
Palliative Medicine RN Note: Symptom check. 90 minutes spent with pt and family.   Patient having significant secretions, which changed quality during my visit. Initially, he had oropharyngeal secretions, but they became thicker and frothy, darkening to brown were easily suctioned from his mouth with exhalation. I gave several bolus doses of Dilaudid and increased his rate, and I obtained an order for IV Lasix and a foley catheter from Dr Hilma Favors. The secretions decreased, and his RN also gave a dose of Ativan.  Initially only church family was present, but his cousin/POA arrived shortly thereafter [name]). [name] verbalized understanding of these changes, and both he and nursing staff verbalized concern for Zeb's son, as he has made statements that he thinks his father is going to survive this admission. His son arrived while I was there, and we discussed our inability to correct what was going on, as well as our continued prayers for a miracle while honoring Yee's dignity by treating his symptoms; his son verbalized understanding. I do remain concerned that his son may panic when Mr Bayman dies and may try to have the staff perform aggressive care; I passed on to the RN that Mr Macioce made his cousin his POA and we have the document on file in the ACP tab. Also, the decision for comfort care was the patient's; he understood that his time has come and asked that we treat him with dignity during the short time he has left.  Due to Shain's marked decline and fragile respiratory status, PMT does not support a transfer to a hospice facility. It is not likely at this time that we could keep him comfortable for a trip to [place]. Further, he is declining quickly, and his family is realistically concerned that he may die during transport; they are not willing to accept that risk. Referral to hospice cancelled.  Marjie Skiff Nahzir Pohle, RN, BSN, [facility] Palliative Medicine Team 02/15/2020  3:50 PM Office [phone]

## 2020-02-15 NOTE — Progress Notes (Signed)
Chaplain responded to a request from patient's RN to offer support to the son.  Son is struggling to accept changes with his father.  Chaplain and son met in the hallway.  Son kept saying, he is going to be alright, he is going to get better.  Chaplain offered space for son to share feelings. Family has a strong faith and son does know of God's presence and God's plan, but wants to hold out hope.  Son was on the way to work.  Chaplain, with permission went and prayed over the patient.  Chaplain available for further assistance as needed. [place], Mdiv.     02/15/20 1900  Clinical Encounter Type  Visited With Family;Health care provider;Patient  Visit Type Spiritual support  Referral From Nurse  Consult/Referral To Chaplain  Spiritual Encounters  Spiritual Needs Emotional;Prayer  Stress Factors  Family Stress Factors Health changes

## 2020-02-15 NOTE — Progress Notes (Signed)
Attempted to reach patient's family to notify of patients declining condition, will continue to call.  Paged palliative awaiting a return call.

## 2020-02-15 NOTE — Progress Notes (Signed)
Wasted 11 cc of Dilaudid from bag in stericycle.  Witnessed by Cloyde Reams, RN.

## 2020-02-15 NOTE — Progress Notes (Signed)
Palliative Medicine RN Note: RN Judson Roch called our team during rounds & left message that pt is declining; she is having to suction him. No Robinul has been given per MAR. I tried to call to let her know it's available, but she is with the family; I will Epic chat her.  Marjie Skiff Raushanah Osmundson, RN, BSN, [facility] Palliative Medicine Team 02/15/2020 9:05 AM Office [phone]

## 2020-02-20 NOTE — Progress Notes (Deleted)
Incision to right neck continues to be oozing,patient stable, Dr. Earleen Newport notified with orders to put pressure for 20 mins and put dressing and pt may sit up 45 degrees.

## 2020-02-20 NOTE — Progress Notes (Signed)
Son, [name], notified of belongings left on 6N. He states that he will "come get them today".

## 2020-02-20 NOTE — Progress Notes (Addendum)
POA called and went straight to voicemail.   *[place] Son [name] to see if we can get in contact with [name], maybe another number we can use, will call back to get new number.   *[phone]- Spoke with [name], states that son [name] has POA and not him, called [name] back and he is still trying to get information about funeral home arranged.

## 2020-02-20 NOTE — Death Summary Note (Signed)
DEATH SUMMARY   Patient Details  Name: Glenn [name] MRN: [id number] DOB: 04/10/1950  Admission/Discharge Information   Admit Date:  02-22-20  Date of Death: Date of Death: 2020-03-07  Time of Death: Time of Death: 0417  Length of Stay: May 23, 2022  Referring Physician: Gerlene Fee, DO   Reason(s) for Hospitalization  Acute systolic and diastolic heart failure Cardiogenic shock AF w/ RVR  Diagnoses  Preliminary cause of death: Acute on chronic heart failure ([place]) Secondary Diagnoses (including complications and co-morbidities):  Principal Problem:   Acute on chronic combined systolic and diastolic CHF (congestive heart failure) ([name]) Active Problems:   CKD (chronic kidney disease) stage 3, GFR 30-59 ml/min ([name])   Persistent atrial fibrillation with rapid ventricular response ([name])   Atrial fibrillation with rapid ventricular response [facility])   Brief Hospital Course (including significant findings, care, treatment, and services provided and events leading to death)  Glenn [name] is a 70 y.o. year old male with history of chronic systolic heart failure on home inotropes who was admitted on 22-Feb-2023 in AF w/ RVR and subsequently cardioverted to normal sinus rhythm on amiodarone infusion.  He required escalating doses of inotropes and vasopressor for pressure support with poor urine output.  Given refractory advanced heart failure, inotrope dependent, patient opted for full comfort care after palliative care consutlation 12/26 and passed the morning of 2023-03-08, pronounced dead at 0420 by RN.  On my evaluation, no breath or heart sounds present.    Pertinent Labs and Studies  Significant Diagnostic Studies CT ABDOMEN PELVIS WO CONTRAST  Result Date: Feb 22, 2020 CLINICAL DATA:  Left-sided abdominal pain for several days. EXAM: CT ABDOMEN AND PELVIS WITHOUT CONTRAST TECHNIQUE: Multidetector CT imaging of the abdomen and pelvis was performed following the standard protocol  without IV contrast. COMPARISON:  CT abdomen pelvis dated January 14, 2020. FINDINGS: Lower chest: Unchanged cardiomegaly and trace right pleural effusion. Hepatobiliary: The gallbladder remains poorly marginated, containing gas and amorphous high attenuation material. Continued gallbladder wall thickening with focal area of low attenuation superior to the gallbladder and extending into segment 4B, communicating with the skin surface through a fistula tract that passes through the subcapsular liver and upper right anterolateral abdominal wall (series 3, images 16-22). Appearance is similar to prior study. Pancreas: Unremarkable. No pancreatic ductal dilatation or surrounding inflammatory changes. Spleen: Normal in size without focal abnormality. Adrenals/Urinary Tract: Adrenal glands are unremarkable. Kidneys are normal, without renal calculi, focal lesion, or hydronephrosis. Mild circumferential bladder wall thickening likely related to under distension. Stomach/Bowel: Stomach is within normal limits. Appendix appears normal. No evidence of bowel wall thickening, distention, or inflammatory changes. Vascular/Lymphatic: Aortic atherosclerosis. Unchanged IVC filter. No enlarged abdominal or pelvic lymph nodes. Reproductive: Prostate is unremarkable. Other: No free fluid or pneumoperitoneum. Musculoskeletal: No acute or significant osseous findings. IMPRESSION: 1. Similar appearing chronic cholecystitis with small pericholecystic fluid collection extending into segment 4B and communicating with the skin surface through a fistula tract that passes through the subcapsular liver and upper right anterolateral abdominal wall. 2. Unchanged trace right pleural effusion. 3. Aortic Atherosclerosis (ICD10-I70.0). Electronically Signed   By: Titus Dubin M.D.   On: February 22, 2020 17:27   DG Chest Portable 1 View  Result Date: 2020/02/22 CLINICAL DATA:  70 year old male with shortness of breath EXAM: PORTABLE CHEST 1 VIEW  COMPARISON:  Chest radiograph dated 12/31/2019. FINDINGS: Cardiomegaly with mild vascular congestion similar to prior radiograph. No focal consolidation, pleural effusion or pneumothorax. There is atherosclerotic calcification of the aortic arch.  Left pectoral AICD device. Right-sided PICC in similar position. No acute osseous pathology. IMPRESSION: Cardiomegaly with mild vascular congestion. No significant interval change. Electronically Signed   By: Anner Crete M.D.   On: 01/27/2020 17:12    Microbiology No results found for this or any previous visit (from the past 240 hour(s)).  Lab Basic Metabolic Panel: Recent Labs  Lab 02/05/2020 0248 02/11/20 0920 02/12/20 0400 02/13/20 0500 02/14/20 0208  NA 135 130* 130* 127* 128*  K 4.1 3.6 4.0 4.0 3.7  CL 91* 83* 83* 83* 83*  CO2 32 33* 34* 32 30  GLUCOSE 105* 239* 130* 193* 105*  BUN 30* 32* 39* 43* 50*  CREATININE 2.69* 2.84* 3.53* 4.04* 4.26*  CALCIUM 8.9 8.9 9.3 9.0 9.0   Liver Function Tests: No results for input(s): AST, ALT, ALKPHOS, BILITOT, PROT, ALBUMIN in the last 168 hours. No results for input(s): LIPASE, AMYLASE in the last 168 hours. No results for input(s): AMMONIA in the last 168 hours. CBC: Recent Labs  Lab 02/09/20 0816 02/17/2020 0248  WBC 6.3 5.7  HGB 9.6* 9.1*  HCT 28.8* 28.8*  MCV 88.6 89.4  PLT 256 225   Cardiac Enzymes: No results for input(s): CKTOTAL, CKMB, CKMBINDEX, TROPONINI in the last 168 hours. Sepsis Labs: Recent Labs  Lab 02/09/20 0816 01/23/2020 0248  WBC 6.3 5.7    Procedures/Operations  DCCV 02/18/2020   Chaia Ikard T Wynne Jury 02/22/2020, 5:42 AM

## 2020-02-20 NOTE — Progress Notes (Signed)
Nutrition Brief Note  Chart reviewed. Pt now transitioning to comfort care.  No further nutrition interventions warranted at this time.  Please re-consult as needed.   Aydan Phoenix W, RD, LDN, CDCES Registered Dietitian II Certified Diabetes Care and Education Specialist Please refer to AMION for RD and/or RD on-call/weekend/after hours pager  

## 2020-02-20 NOTE — Plan of Care (Signed)
  Problem: Pain Management: Goal: Satisfaction with pain management regimen will improve Outcome: Progressing   

## 2020-02-20 NOTE — Progress Notes (Signed)
Patient received, patient has expired. Waiting for POA to come and see patient and decide funeral home information.

## 2020-02-20 NOTE — Progress Notes (Signed)
Unspiked Dilauded infusion returned to pharmacy by RN; wasted in pharmacy stericycle bin.

## 2020-02-20 DEATH — deceased

## 2020-02-24 ENCOUNTER — Encounter (HOSPITAL_COMMUNITY): Payer: Self-pay | Admitting: *Deleted

## 2020-02-24 NOTE — Progress Notes (Signed)
Pt's DC completed and signed by Dr Haroldine Laws, funeral home picked up

## 2020-03-09 ENCOUNTER — Encounter (HOSPITAL_COMMUNITY): Payer: Medicare HMO | Admitting: Internal Medicine

## 2021-07-12 IMAGING — DX DG CHEST 1V PORT
1 series · 1 of 1 positions shown · non-contrast
Comparison: 10/29/2018

CLINICAL DATA: Stroke

EXAM:
PORTABLE CHEST 1 VIEW

[chest]
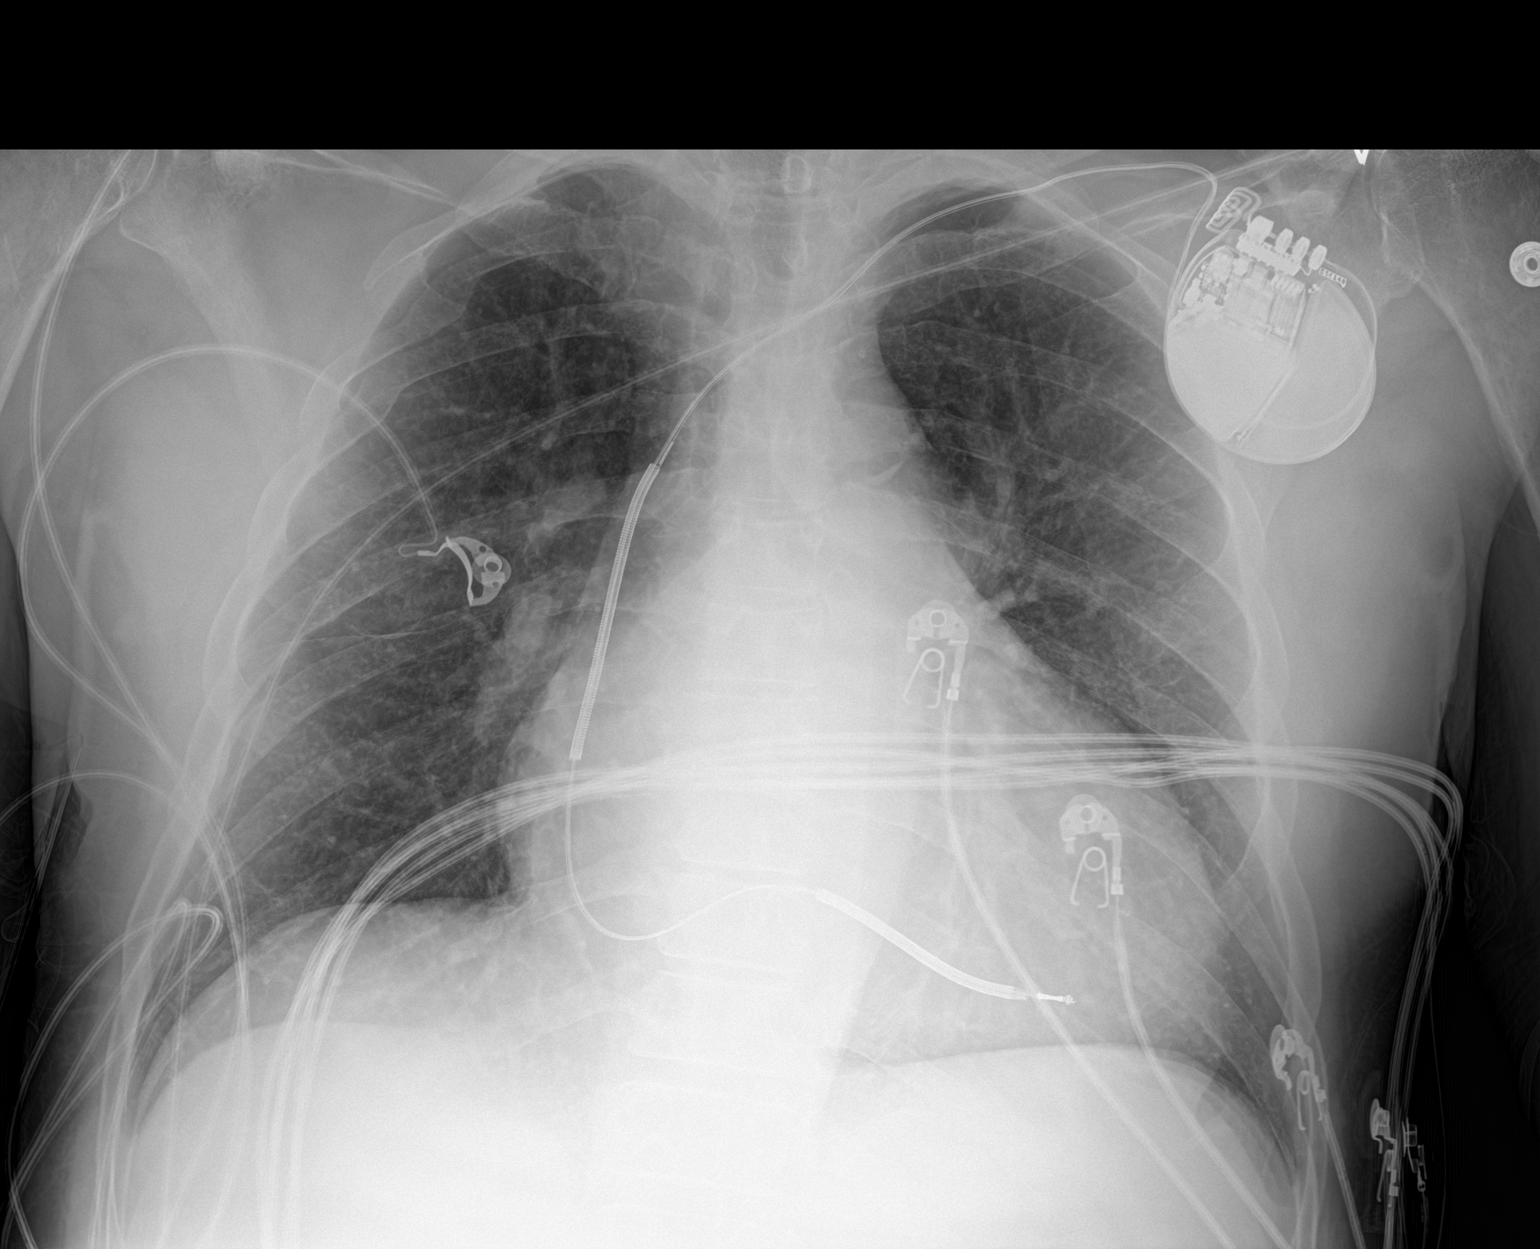

[1 of 1 positions shown; findings below may reference images not displayed]

FINDINGS: Left AICD remains in place, unchanged. Cardiomegaly. No confluent
opacities, effusions or edema. No acute bony abnormality.
IMPRESSION: Cardiomegaly.  No active disease.

## 2021-07-12 IMAGING — CT CT ANGIO NECK
2 of 7 series · 8 of 33 positions shown · IV contrast (omnipaque)
Comparison: CT head 11/10/2018

CLINICAL DATA: Stroke with left-sided weakness and slurred speech

EXAM:
CT ANGIOGRAPHY HEAD AND NECK
TECHNIQUE: Multidetector CT imaging of the head and neck was performed using
the standard protocol during bolus administration of intravenous
contrast. Multiplanar CT image reconstructions and MIPs were
obtained to evaluate the vascular anatomy. Carotid stenosis
measurements (when applicable) are obtained utilizing NASCET
criteria, using the distal internal carotid diameter as the
denominator.
CONTRAST:  75mL OMNIPAQUE IOHEXOL 350 MG/ML SOLN

[Series 5: cta neck · axial · 0.52mm/px · z∈[-170,-60]mm · 2 of 167 slices shown]
[im 56/167  soft-tissue]
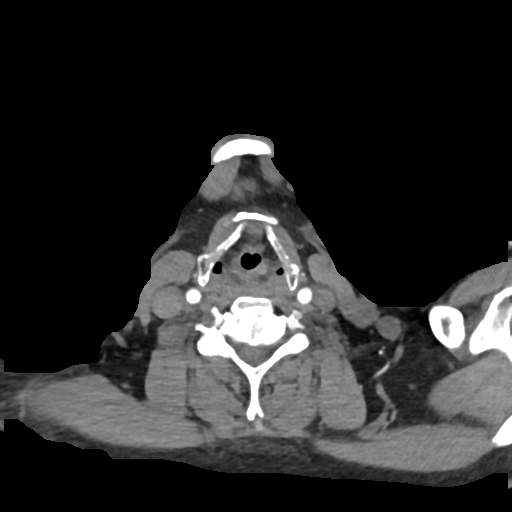
[im 111/167  soft-tissue]
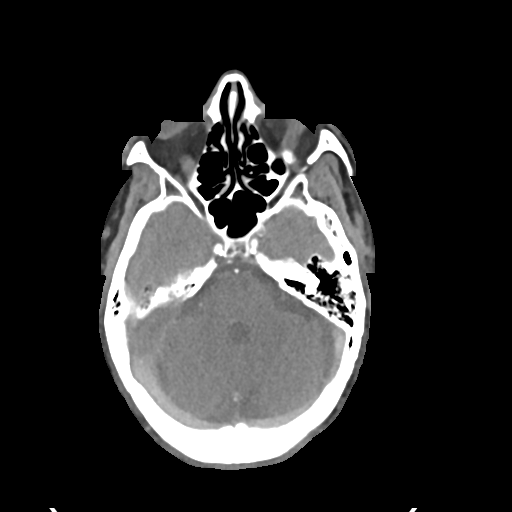

[Series 7: cta neck axial · axial · 0.39mm/px · z∈[-232,+2]mm · 6 of 331 slices shown]
[im 48/331  soft-tissue]
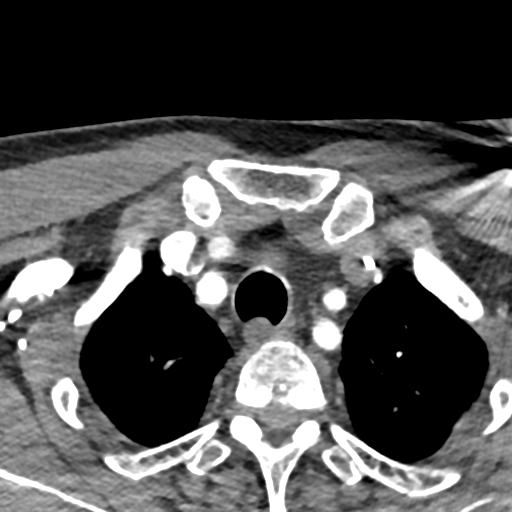
[im 95/331  bone]
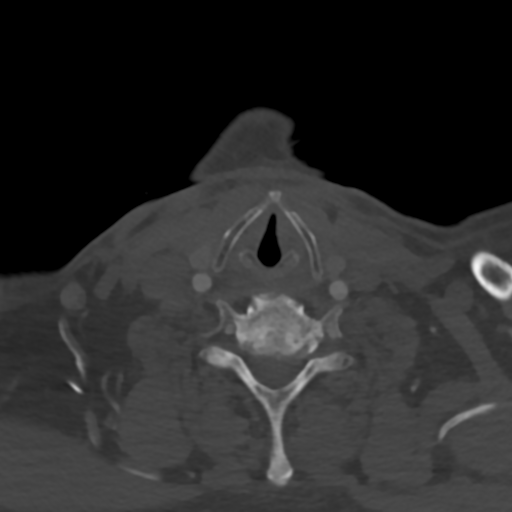
[im 142/331  soft-tissue]
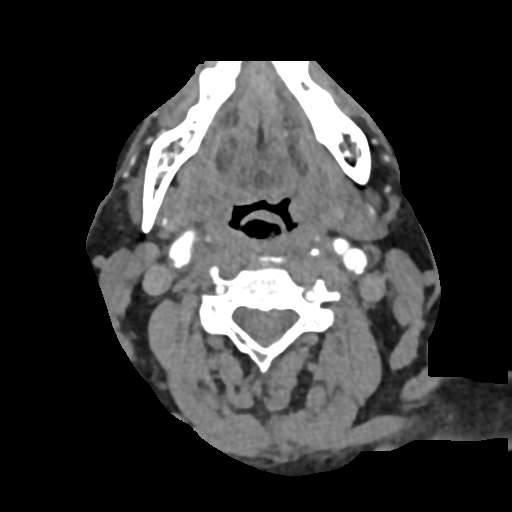
[im 189/331  bone]
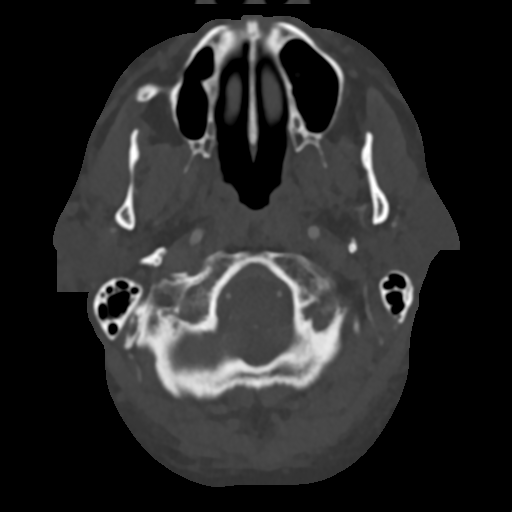
[im 236/331  soft-tissue]
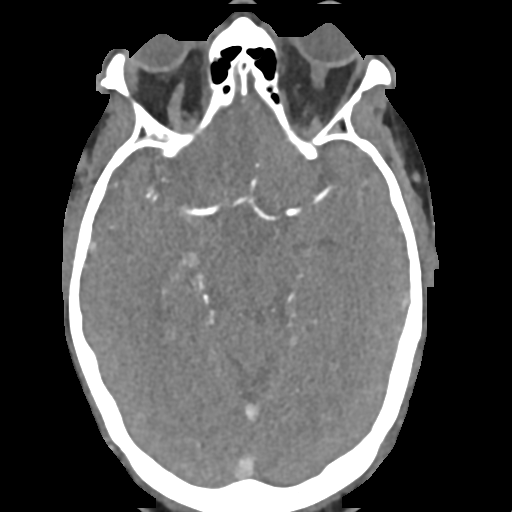
[im 283/331  bone]
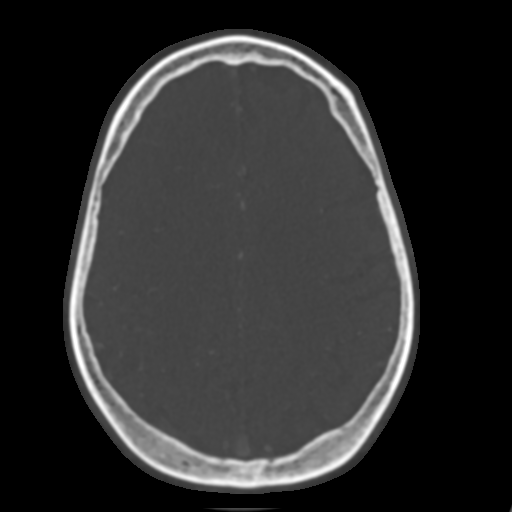

[8 of 33 positions shown; findings below may reference images not displayed]

FINDINGS: CTA NECK FINDINGS

Aortic arch: Standard branching. Imaged portion shows no evidence of
aneurysm or dissection. No significant stenosis of the major arch
vessel origins.

Right carotid system: Calcified and noncalcified plaque right
carotid bulb narrowing the lumen by approximately 35-40% diameter
stenosis.

Left carotid system: Mild atherosclerotic disease left carotid
bifurcation without significant stenosis.

Vertebral arteries: Both vertebral arteries patent to the basilar
without significant stenosis.

Skeleton: Cervical spondylosis without acute abnormality.

Other neck: Negative

Upper chest: Negative

Review of the MIP images confirms the above findings

CTA HEAD FINDINGS

Anterior circulation: Heavily calcified right cavernous carotid with
moderate stenosis. Irregular subtotal occlusion of the right M1
segment with good distal flow. The area of stenosis has irregularity
most compatible with thrombus.

Left middle cerebral artery widely patent. Anterior cerebral
arteries patent bilaterally without significant stenosis.

Posterior circulation: Both vertebral arteries patent to the
basilar. PICA patent bilaterally. Atherosclerotic irregularity and
mild to moderate stenosis in the mid and distal basilar artery.
Superior cerebellar and posterior cerebral arteries patent
bilaterally. Mild atherosclerotic disease in the posterior cerebral
arteries bilaterally.

Venous sinuses: Patent

Anatomic variants: None

Review of the MIP images confirms the above findings
IMPRESSION: 1. Subtotal occlusion right M1 segment with irregularity most
consistent with thrombus. Good distal flow. Associated hemorrhagic
infarct right MCA territory noted on earlier CT head. Moderate
atherosclerotic stenosis right cavernous carotid. 35-40% diameter
stenosis right internal carotid artery stenosis at the origin.
2. Left carotid widely patent with mild atherosclerotic disease at
the bifurcation. Both vertebral arteries widely patent.
3. Mild moderate basilar stenosis. Mild atherosclerotic disease
posterior cerebral artery bilaterally.
4. These results were called by telephone at the time of
interpretation on 11/10/2018 at [DATE] to provider ELENH BOTIS ,
who verbally acknowledged these results.
[DATE].
